# Patient Record
Sex: Female | Born: 1950
Health system: Southern US, Community
[De-identification: ages and names within clinical notes are randomized; demographics above are authoritative.]

## PROBLEM LIST (undated history)

## (undated) DIAGNOSIS — R931 Abnormal findings on diagnostic imaging of heart and coronary circulation: Secondary | ICD-10-CM

## (undated) DIAGNOSIS — J45909 Unspecified asthma, uncomplicated: Secondary | ICD-10-CM

## (undated) DIAGNOSIS — N200 Calculus of kidney: Secondary | ICD-10-CM

## (undated) DIAGNOSIS — K219 Gastro-esophageal reflux disease without esophagitis: Secondary | ICD-10-CM

## (undated) DIAGNOSIS — N649 Disorder of breast, unspecified: Secondary | ICD-10-CM

## (undated) DIAGNOSIS — R319 Hematuria, unspecified: Secondary | ICD-10-CM

## (undated) DIAGNOSIS — M5137 Other intervertebral disc degeneration, lumbosacral region: Secondary | ICD-10-CM

## (undated) DIAGNOSIS — F419 Anxiety disorder, unspecified: Secondary | ICD-10-CM

## (undated) DIAGNOSIS — R740 Nonspecific elevation of levels of transaminase and lactic acid dehydrogenase [LDH]: Secondary | ICD-10-CM

## (undated) DIAGNOSIS — I38 Endocarditis, valve unspecified: Secondary | ICD-10-CM

## (undated) DIAGNOSIS — J449 Chronic obstructive pulmonary disease, unspecified: Secondary | ICD-10-CM

## (undated) DIAGNOSIS — C7800 Secondary malignant neoplasm of unspecified lung: Secondary | ICD-10-CM

## (undated) DIAGNOSIS — F329 Major depressive disorder, single episode, unspecified: Secondary | ICD-10-CM

## (undated) DIAGNOSIS — G20 Parkinson's disease: Secondary | ICD-10-CM

## (undated) DIAGNOSIS — R9431 Abnormal electrocardiogram [ECG] [EKG]: Secondary | ICD-10-CM

## (undated) DIAGNOSIS — R0789 Other chest pain: Secondary | ICD-10-CM

## (undated) DIAGNOSIS — I10 Essential (primary) hypertension: Secondary | ICD-10-CM

## (undated) DIAGNOSIS — R7401 Elevation of levels of liver transaminase levels: Secondary | ICD-10-CM

## (undated) DIAGNOSIS — E785 Hyperlipidemia, unspecified: Secondary | ICD-10-CM

## (undated) DIAGNOSIS — F32A Depression, unspecified: Secondary | ICD-10-CM

## (undated) DIAGNOSIS — M51379 Other intervertebral disc degeneration, lumbosacral region without mention of lumbar back pain or lower extremity pain: Secondary | ICD-10-CM

## (undated) DIAGNOSIS — E7800 Pure hypercholesterolemia, unspecified: Secondary | ICD-10-CM

## (undated) DIAGNOSIS — C189 Malignant neoplasm of colon, unspecified: Secondary | ICD-10-CM

## (undated) HISTORY — DX: Major depressive disorder, single episode, unspecified: F32.9

## (undated) HISTORY — DX: Chronic obstructive pulmonary disease, unspecified: J44.9

## (undated) HISTORY — DX: Calculus of kidney: N20.0

## (undated) HISTORY — DX: Essential (primary) hypertension: I10

## (undated) HISTORY — PX: COLON SURGERY: SHX602

## (undated) HISTORY — DX: Hyperlipidemia, unspecified: E78.5

## (undated) HISTORY — DX: Depression, unspecified: F32.A

## (undated) HISTORY — DX: Malignant neoplasm of colon, unspecified: C18.9

## (undated) HISTORY — PX: TOTAL ABDOMINAL HYSTERECTOMY: SHX209

## (undated) HISTORY — DX: Parkinson's disease: G20

## (undated) HISTORY — DX: Secondary malignant neoplasm of unspecified lung: C78.00

## (undated) HISTORY — DX: Elevation of levels of liver transaminase levels: R74.01

## (undated) HISTORY — DX: Morbid (severe) obesity due to excess calories: E66.01

## (undated) HISTORY — DX: Disorder of breast, unspecified: N64.9

## (undated) HISTORY — DX: Nonspecific elevation of levels of transaminase and lactic acid dehydrogenase (ldh): R74.0

## (undated) HISTORY — DX: Abnormal electrocardiogram (ECG) (EKG): R94.31

## (undated) HISTORY — DX: Gastro-esophageal reflux disease without esophagitis: K21.9

## (undated) HISTORY — DX: Pure hypercholesterolemia, unspecified: E78.00

## (undated) HISTORY — DX: Abnormal findings on diagnostic imaging of heart and coronary circulation: R93.1

## (undated) HISTORY — DX: Other chest pain: R07.89

---

## 1978-06-11 HISTORY — PX: TUBAL LIGATION: SHX77

## 1997-06-11 DIAGNOSIS — C189 Malignant neoplasm of colon, unspecified: Secondary | ICD-10-CM

## 1997-06-11 HISTORY — DX: Malignant neoplasm of colon, unspecified: C18.9

## 1998-06-11 HISTORY — PX: CHOLECYSTECTOMY: SHX55

## 2000-05-23 ENCOUNTER — Encounter: Payer: Self-pay | Admitting: Cardiology

## 2000-05-30 ENCOUNTER — Encounter: Payer: Self-pay | Admitting: Cardiology

## 2001-06-11 HISTORY — PX: CARDIAC CATHETERIZATION: SHX172

## 2001-08-21 ENCOUNTER — Encounter: Payer: Self-pay | Admitting: Cardiology

## 2003-10-28 ENCOUNTER — Encounter: Payer: Self-pay | Admitting: Cardiology

## 2003-11-01 ENCOUNTER — Encounter: Payer: Self-pay | Admitting: Cardiology

## 2003-11-29 ENCOUNTER — Encounter: Payer: Self-pay | Admitting: Cardiology

## 2004-07-12 ENCOUNTER — Ambulatory Visit: Payer: Self-pay | Admitting: Internal Medicine

## 2004-08-02 ENCOUNTER — Ambulatory Visit: Payer: Self-pay | Admitting: Internal Medicine

## 2004-08-16 ENCOUNTER — Ambulatory Visit: Payer: Self-pay | Admitting: Internal Medicine

## 2004-11-10 ENCOUNTER — Encounter: Payer: Self-pay | Admitting: Cardiology

## 2004-11-16 ENCOUNTER — Encounter: Payer: Self-pay | Admitting: Cardiology

## 2005-06-03 ENCOUNTER — Emergency Department: Payer: Self-pay | Admitting: Emergency Medicine

## 2006-03-28 IMAGING — MR MRI HEAD WITHOUT AND WITH CONTRAST
12 of 20 series · 32 of 48 positions shown · non-contrast
Comparison: none

REASON FOR EXAM: dizziness and unsteadiness   poss TIA
COMMENTS:  [id number] or [phone]

[Series 2: T1 · sagittal · 5.0mm · 0.43mm/px · 3 of 19 slices shown (1 of 4)]
[im 1/19]
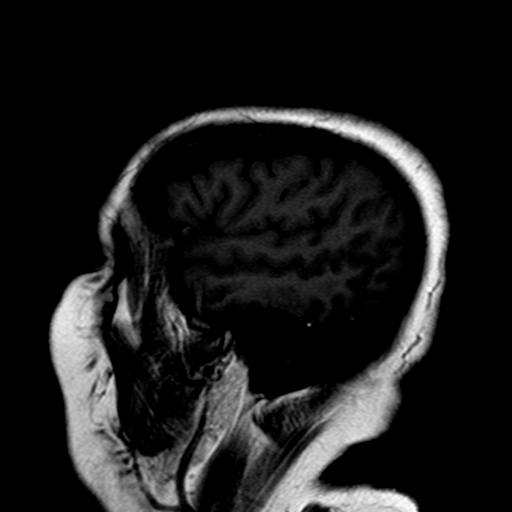
[im 10/19]
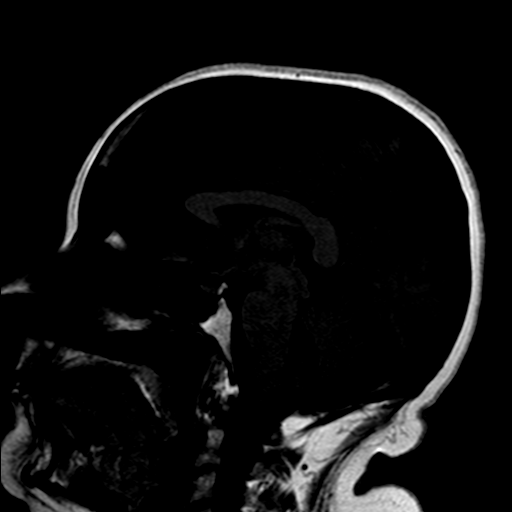
[im 19/19]
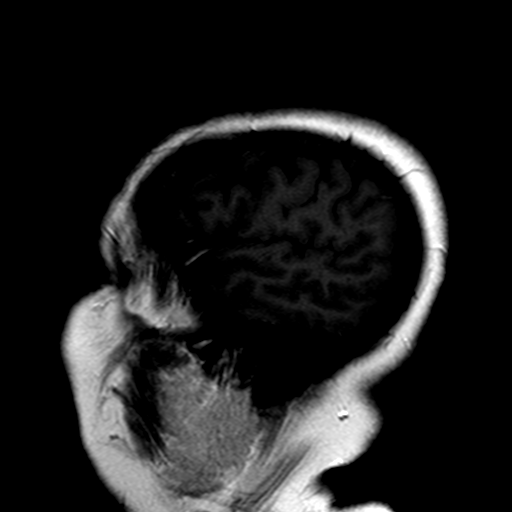

[Series 14: FLAIR · axial · 5.0mm · 0.43mm/px · z∈[-50,+83]mm · 4 of 23 slices shown (1 of 2)]
[im 1/23]
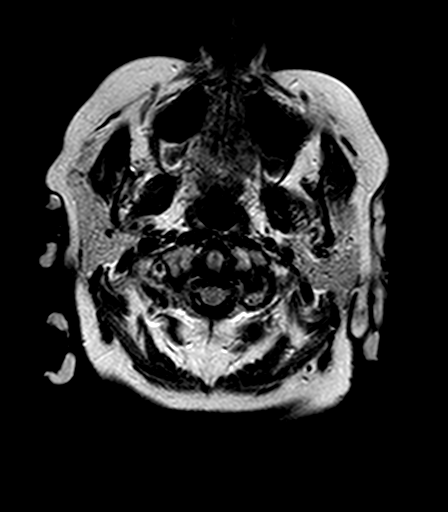
[im 8/23]
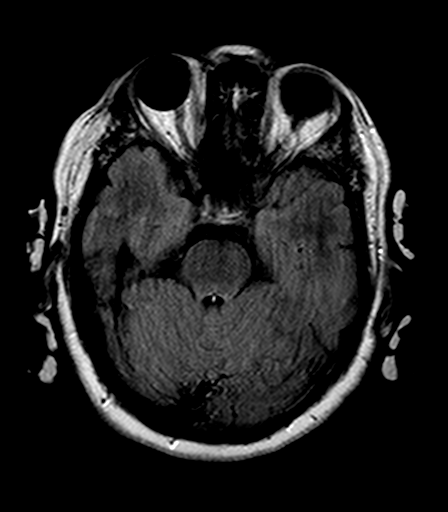
[im 15/23]
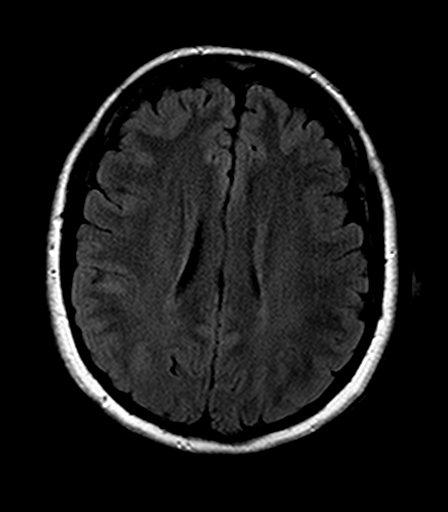
[im 23/23]
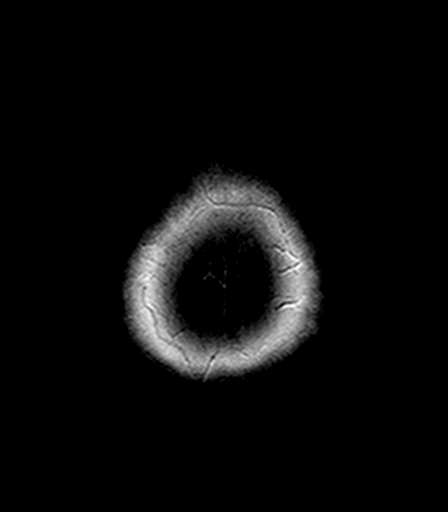

[Series 19: T1 · coronal · 5.0mm · 0.43mm/px · 4 of 23 slices shown (2 of 4)]
[im 1/23]
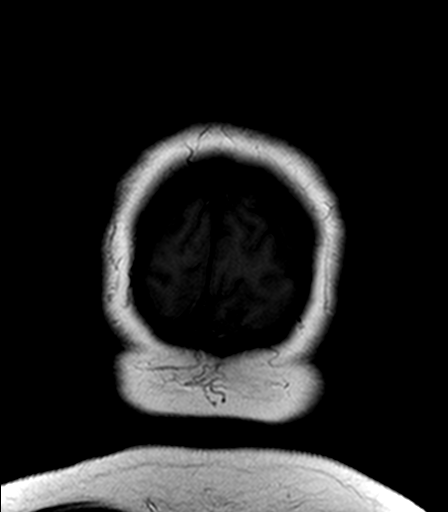
[im 8/23]
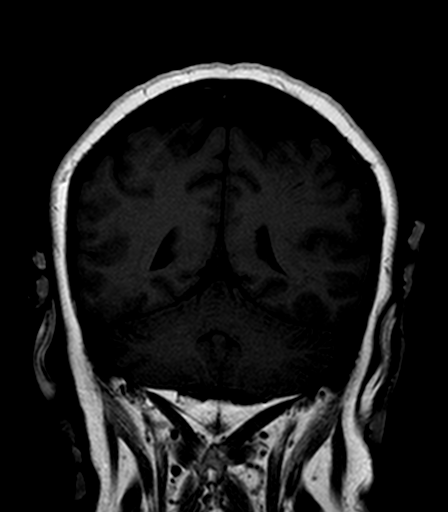
[im 15/23]
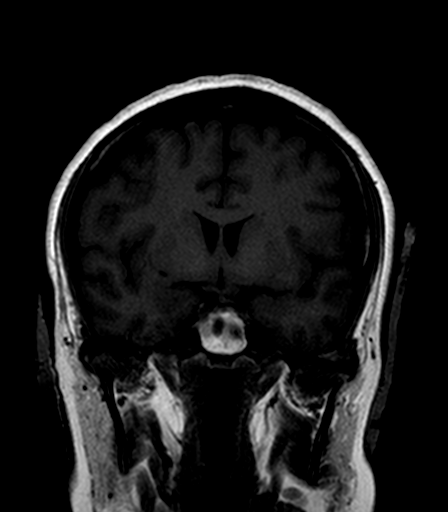
[im 23/23]
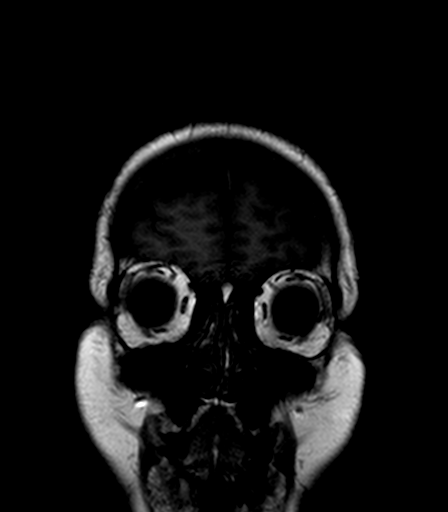

[Series 21: T1 · axial · 5.0mm · 0.86mm/px · z∈[-43,+79]mm · 4 of 23 slices shown (3 of 4)]
[im 1/23]
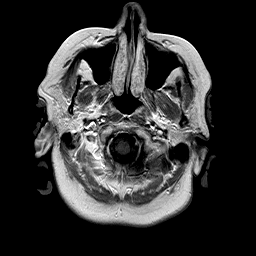
[im 8/23]
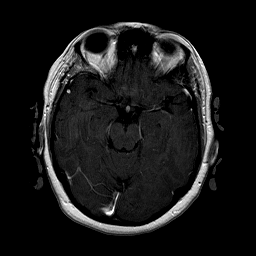
[im 15/23]
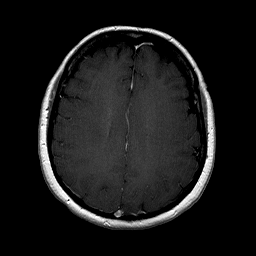
[im 23/23]
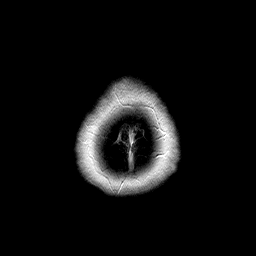

[Series 5001: T1 · axial · 10.0mm · 0.55mm/px · 1 of 3 slices shown (4 of 4)]
[im 1/3]
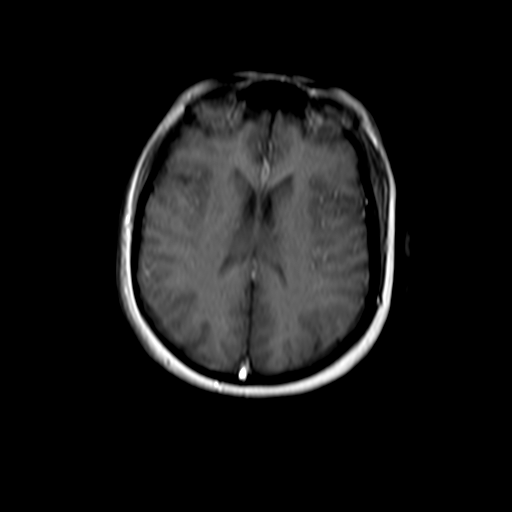

[Series 5002: DWI · axial · 5.0mm · 1.80mm/px · z∈[-34,+74]mm · 4 of 19 slices shown (1 of 2)]
[im 1/19]
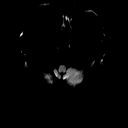
[im 7/19]
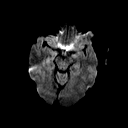
[im 13/19]
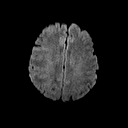
[im 19/19]
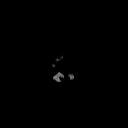

[Series 5003: ADC · axial · 5.0mm · 1.80mm/px · z∈[-34,+74]mm · 4 of 19 slices shown (1 of 2)]
[im 1/19]
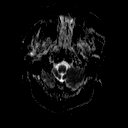
[im 7/19]
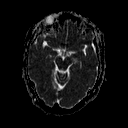
[im 13/19]
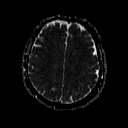
[im 19/19]
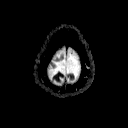

[Series 5004: DWI · axial · 10.0mm · 0.55mm/px · 1 of 3 slices shown (2 of 2)]
[im 1/3]
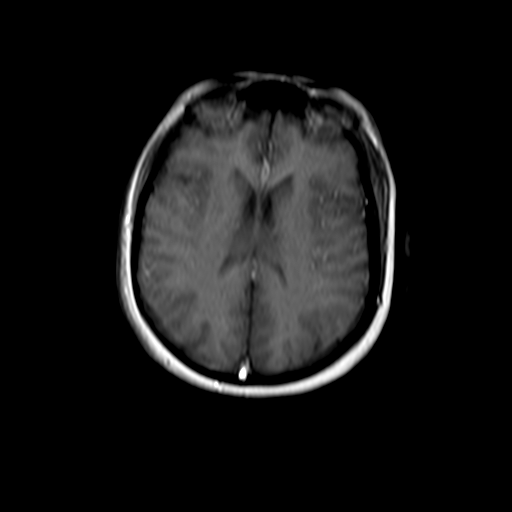

[Series 5005: ADC · axial · 10.0mm · 0.55mm/px · 1 of 3 slices shown (2 of 2)]
[im 1/3]
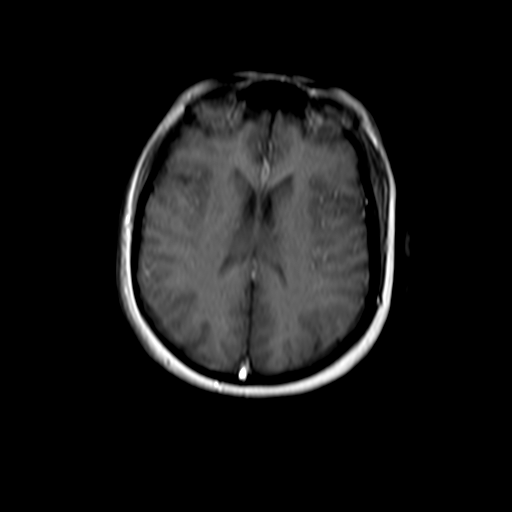

[Series 5008: T2 · axial · 5.0mm · 0.57mm/px · z∈[-42,+80]mm · 4 of 23 slices shown (1 of 2)]
[im 1/23]
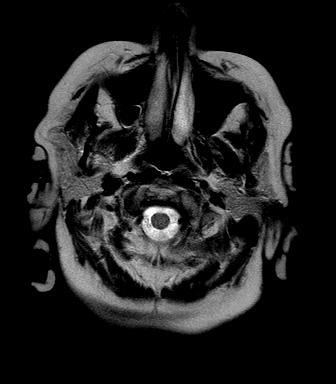
[im 8/23]
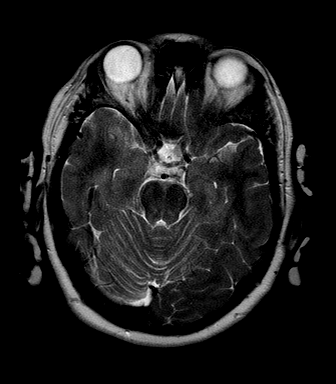
[im 15/23]
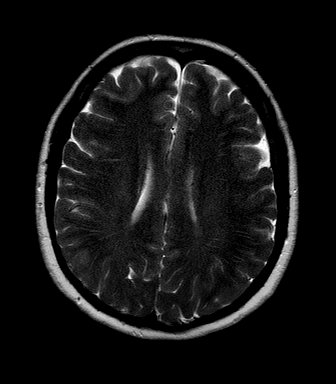
[im 23/23]
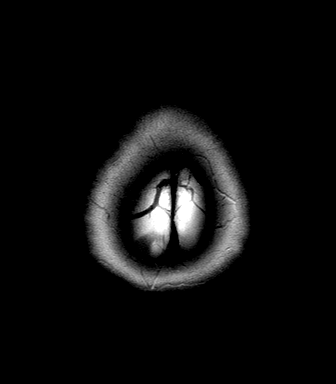

[Series 5010: T2 · axial · 10.0mm · 0.55mm/px · 1 of 3 slices shown (2 of 2)]
[im 1/3]
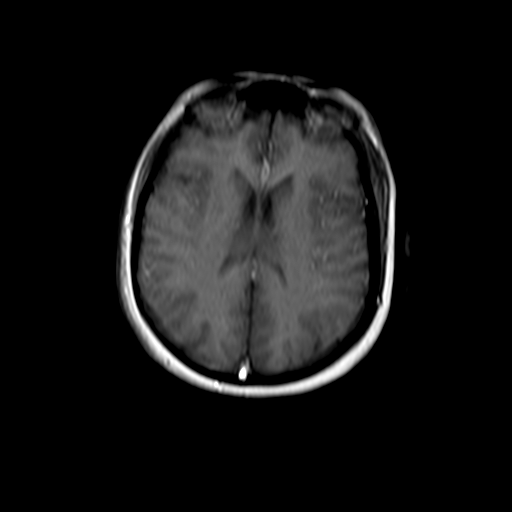

[Series 5011: FLAIR · axial · 10.0mm · 0.55mm/px · 1 of 3 slices shown (2 of 2)]
[im 1/3]
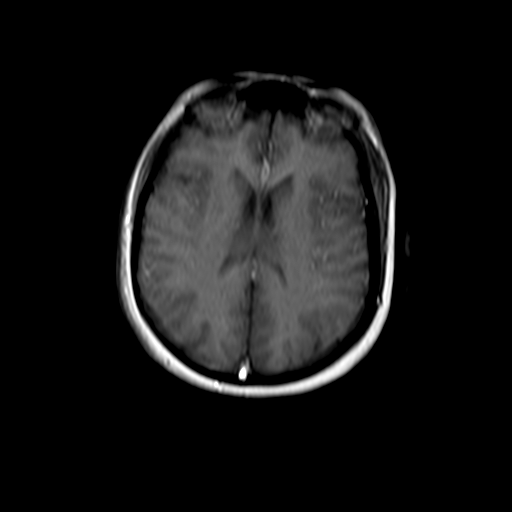

[32 of 48 positions shown; findings below may reference images not displayed]

PROCEDURE:     MR  - MR BRAIN WO/W CONTRAST  - August 02, 2004  [DATE]

RESULT:     Multiplanar/multisequence imaging of the Brain was obtained pre
and post administration of Gadolinium.

Evaluation of the diffusion-weighted images demonstrates no evidence of
increased signal intensity to suggest the sequela of an acute or subacute
infarct.  There does not appear to be evidence of intra-axial nor
extra-axial fluid collections.  The sella and parasellar regions and
structures demonstrate no signal nor enhancement abnormalities. There is
appropriate gray-white matter differentiation.  No evidence of abnormal
parenchymal enhancement or enhancing masses are demonstrated.  Evaluation of
the cerebellopontine angle regions and visualized portions of the 7th and
8th cranial nerves demonstrate no signal nor enhancement abnormalities nor
evidence of masses. Evaluation of the posterior fossa demonstrates no signal
nor enhancement abnormalities.
IMPRESSION: 1)Unremarkable Brain MRI as described above.

## 2006-05-13 ENCOUNTER — Ambulatory Visit: Payer: Self-pay | Admitting: Unknown Physician Specialty

## 2006-05-27 ENCOUNTER — Ambulatory Visit: Payer: Self-pay | Admitting: Internal Medicine

## 2006-06-05 ENCOUNTER — Ambulatory Visit: Payer: Self-pay | Admitting: Internal Medicine

## 2006-06-11 DIAGNOSIS — C7800 Secondary malignant neoplasm of unspecified lung: Secondary | ICD-10-CM

## 2006-06-11 HISTORY — DX: Secondary malignant neoplasm of unspecified lung: C78.00

## 2006-07-10 ENCOUNTER — Ambulatory Visit: Payer: Self-pay | Admitting: Urology

## 2006-11-23 ENCOUNTER — Emergency Department: Payer: Self-pay | Admitting: Emergency Medicine

## 2006-11-23 ENCOUNTER — Other Ambulatory Visit: Payer: Self-pay

## 2006-11-25 ENCOUNTER — Ambulatory Visit: Payer: Self-pay | Admitting: Oncology

## 2006-11-27 ENCOUNTER — Emergency Department: Payer: Self-pay | Admitting: Emergency Medicine

## 2006-11-29 ENCOUNTER — Ambulatory Visit: Payer: Self-pay | Admitting: Oncology

## 2006-12-10 ENCOUNTER — Ambulatory Visit: Payer: Self-pay | Admitting: Oncology

## 2007-01-08 ENCOUNTER — Ambulatory Visit: Payer: Self-pay | Admitting: Surgery

## 2007-01-10 ENCOUNTER — Ambulatory Visit: Payer: Self-pay | Admitting: Oncology

## 2007-01-10 HISTORY — PX: LOBECTOMY: SHX5089

## 2007-01-15 ENCOUNTER — Inpatient Hospital Stay: Payer: Self-pay | Admitting: Surgery

## 2007-01-20 ENCOUNTER — Other Ambulatory Visit: Payer: Self-pay

## 2007-01-27 ENCOUNTER — Ambulatory Visit: Payer: Self-pay | Admitting: Surgery

## 2007-01-27 IMAGING — CR STERNUM - 2+ VIEW
1 series · 3 of 3 positions shown · non-contrast
Comparison: none

REASON FOR EXAM: Fell with pain bruising
COMMENTS:

PROCEDURE:     DXR - DXR STERNUM  - June 03, 2005  [DATE]
RESULT:     There is no evidence of fracture, dislocation, or malalignment.

[Series 1: view not recorded · 0.17mm/px · 3 of 3 slices shown]
[im 1/3]
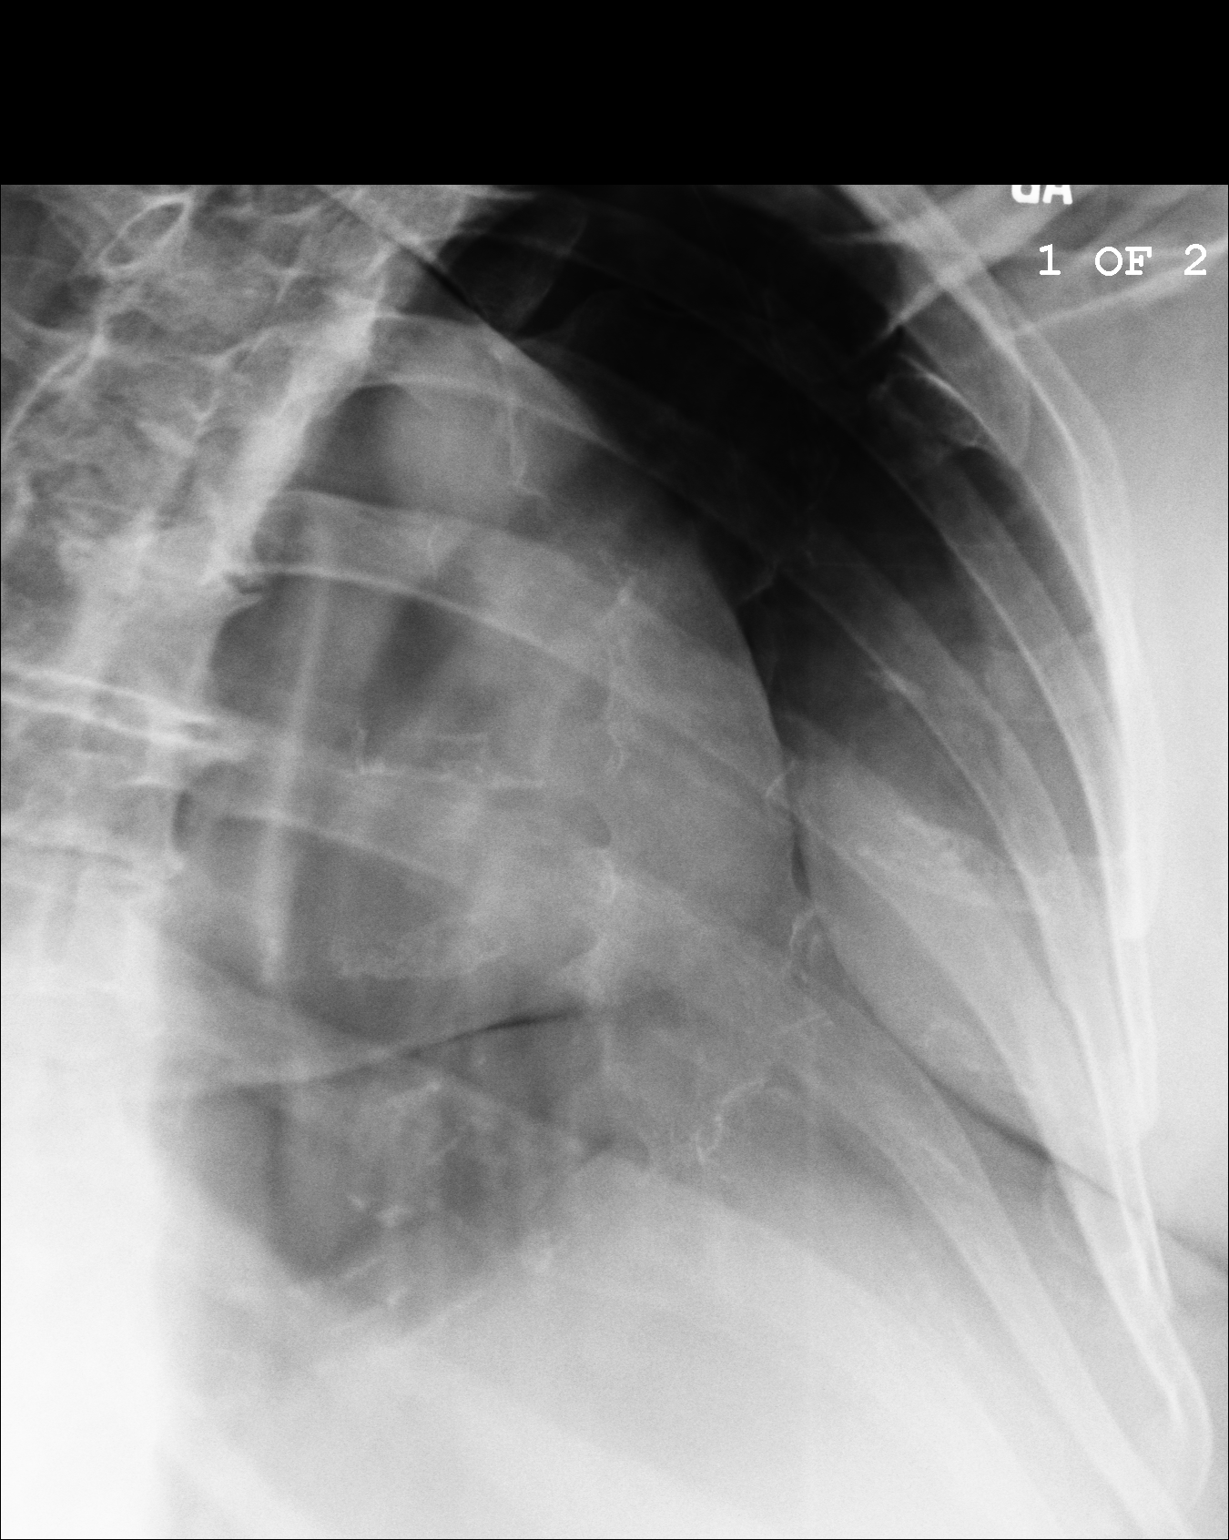
[im 2/3]
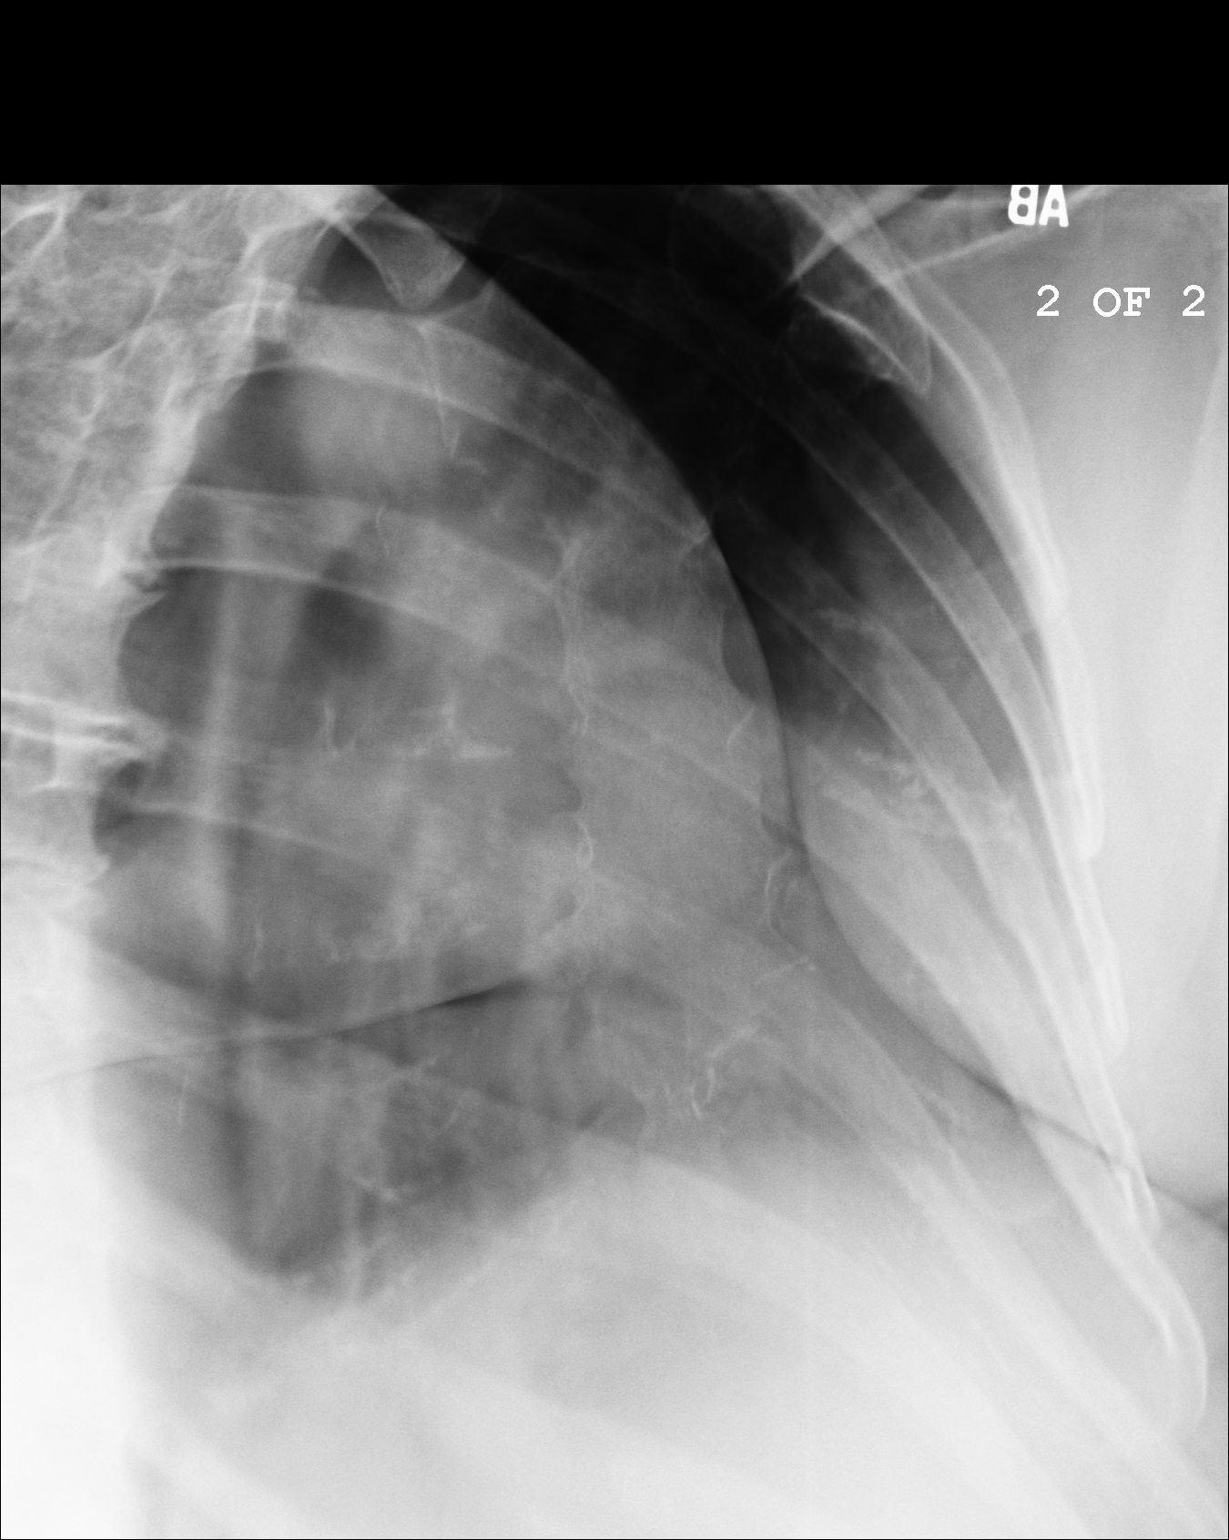
[im 3/3]
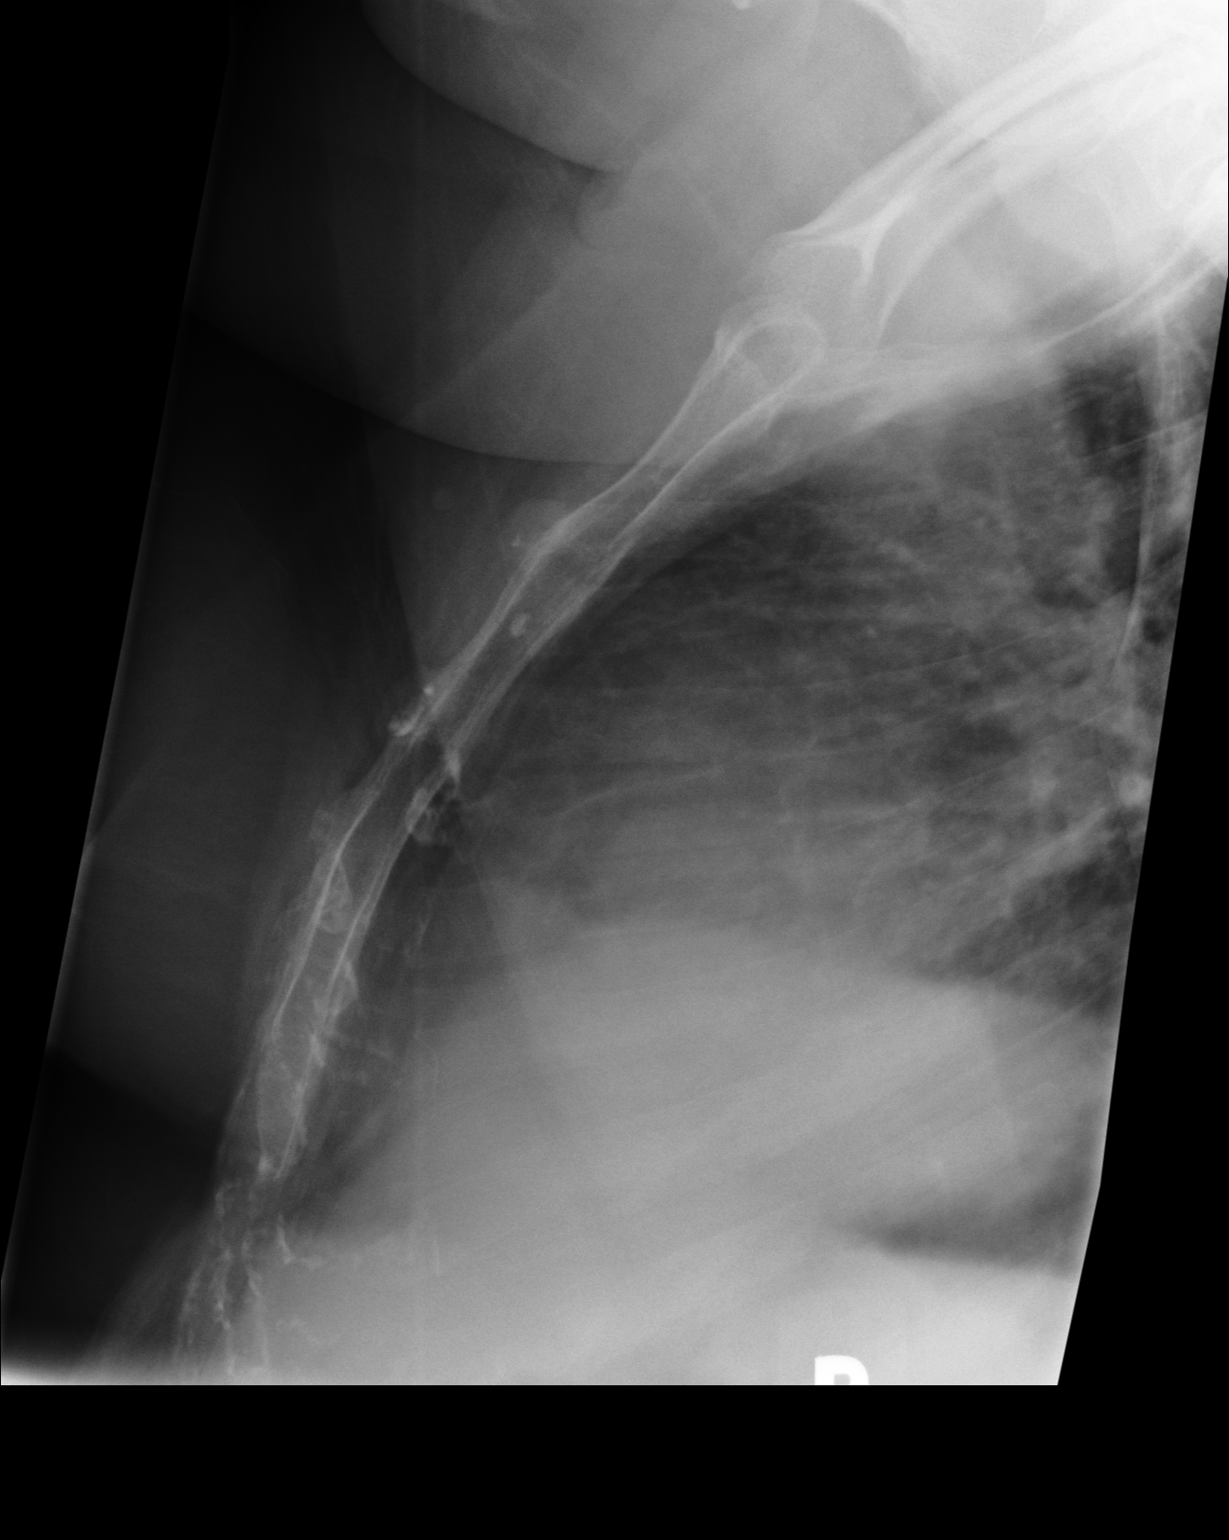

[3 of 3 positions shown; findings below may reference images not displayed]

IMPRESSION: Unremarkable sternal evaluation as described above.

## 2007-01-27 IMAGING — CR DG CHEST 2V
1 series · 2 of 2 positions shown · non-contrast
Comparison: none

REASON FOR EXAM: FALL
COMMENTS:  LMP: N/A

[Series 2903: postero_anterior · 0.11mm/px · 2 of 2 slices shown]
[im 1/2]
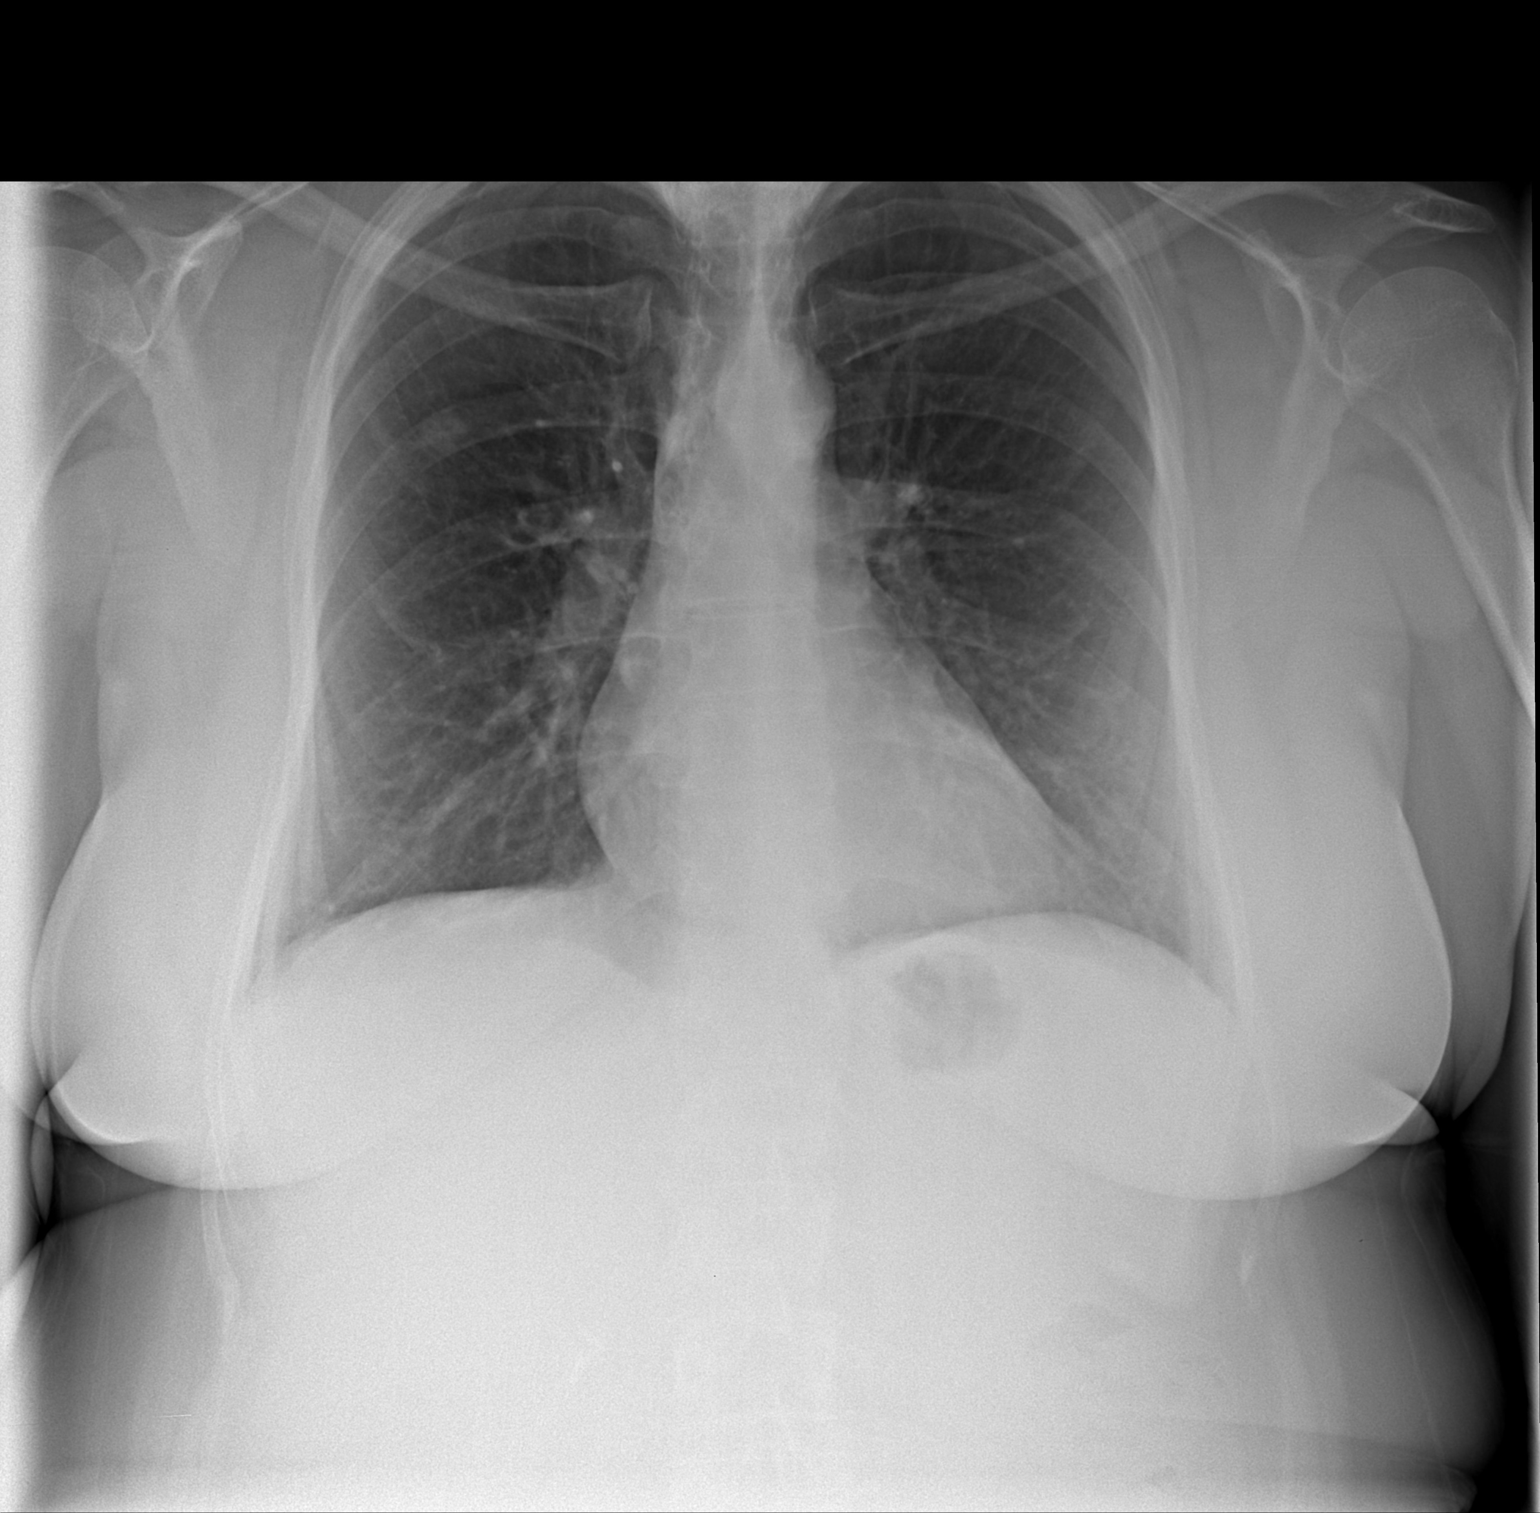
[im 2/2]
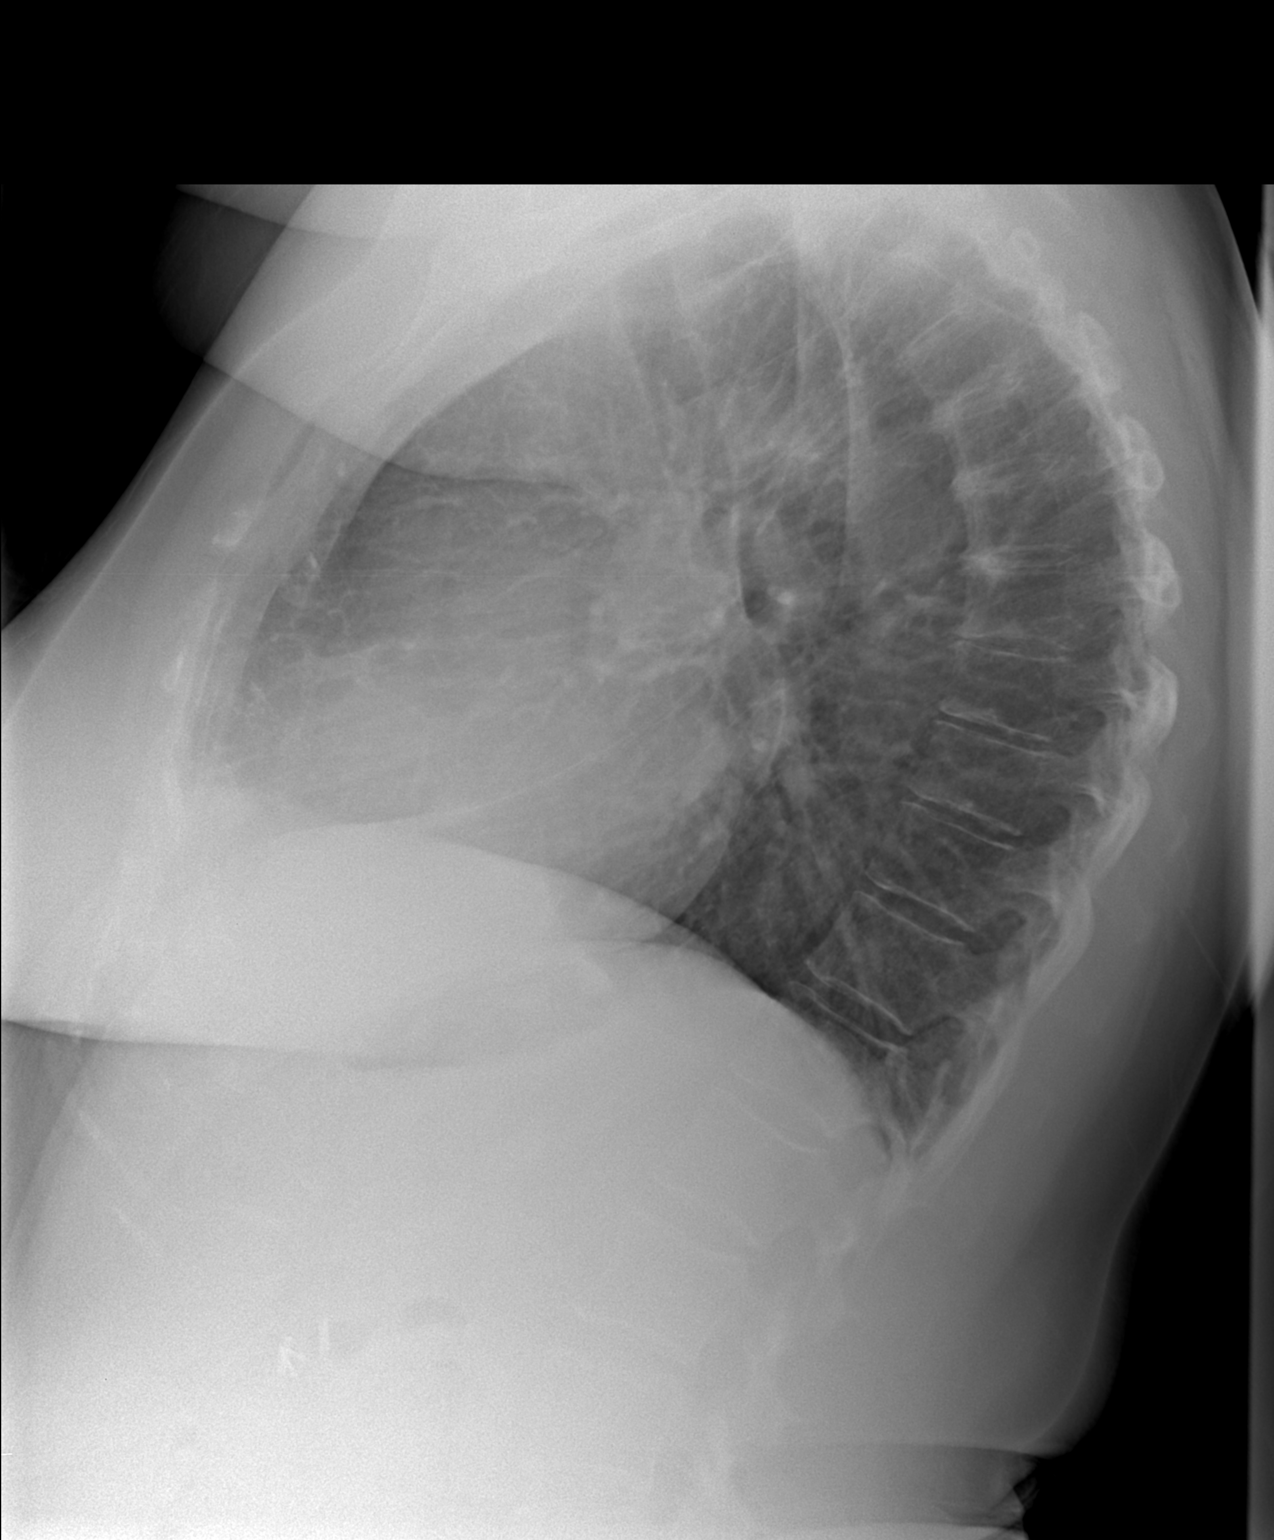

[2 of 2 positions shown; findings below may reference images not displayed]

PROCEDURE:     DXR - DXR CHEST PA (OR AP) AND LATERAL  - June 03, 2005  [DATE]

RESULT:     An ill-defined area of increased density projects within the
RIGHT upper lobe appreciated on the frontal view.  This can be monitored
with repeat two-view chest if clinically warranted or further evaluation
with chest CT.  A repeat chest radiograph can be obtained in 4-6 weeks if
clinically warranted.  Otherwise no further evidence of focal infiltrates,
effusions, or edema.  The cardiac silhouette and visualized bony skeleton
are unremarkable.
IMPRESSION: Area of increased density RIGHT upper lobe.  Repeat evaluation in 4-6 weeks
is recommended or further evaluation with chest CT.

## 2007-02-04 ENCOUNTER — Ambulatory Visit: Payer: Self-pay | Admitting: Surgery

## 2007-02-10 ENCOUNTER — Ambulatory Visit: Payer: Self-pay | Admitting: Oncology

## 2007-02-13 ENCOUNTER — Ambulatory Visit: Payer: Self-pay | Admitting: Oncology

## 2007-03-11 ENCOUNTER — Ambulatory Visit: Payer: Self-pay | Admitting: Surgery

## 2007-03-12 ENCOUNTER — Ambulatory Visit: Payer: Self-pay | Admitting: Oncology

## 2007-03-28 ENCOUNTER — Ambulatory Visit: Payer: Self-pay | Admitting: Unknown Physician Specialty

## 2007-04-01 ENCOUNTER — Ambulatory Visit: Payer: Self-pay | Admitting: Unknown Physician Specialty

## 2007-04-12 ENCOUNTER — Ambulatory Visit: Payer: Self-pay | Admitting: Oncology

## 2007-05-12 ENCOUNTER — Ambulatory Visit: Payer: Self-pay | Admitting: Oncology

## 2007-06-12 ENCOUNTER — Ambulatory Visit: Payer: Self-pay | Admitting: Oncology

## 2007-07-13 ENCOUNTER — Ambulatory Visit: Payer: Self-pay | Admitting: Oncology

## 2007-08-10 ENCOUNTER — Ambulatory Visit: Payer: Self-pay | Admitting: Oncology

## 2007-09-10 ENCOUNTER — Ambulatory Visit: Payer: Self-pay | Admitting: Oncology

## 2007-09-27 ENCOUNTER — Emergency Department: Payer: Self-pay | Admitting: Internal Medicine

## 2007-10-10 ENCOUNTER — Ambulatory Visit: Payer: Self-pay | Admitting: Oncology

## 2007-10-30 ENCOUNTER — Ambulatory Visit: Payer: Self-pay | Admitting: Oncology

## 2007-11-10 ENCOUNTER — Ambulatory Visit: Payer: Self-pay | Admitting: Oncology

## 2007-12-11 ENCOUNTER — Ambulatory Visit: Payer: Self-pay | Admitting: Unknown Physician Specialty

## 2008-01-30 ENCOUNTER — Ambulatory Visit: Payer: Self-pay | Admitting: Oncology

## 2008-02-10 ENCOUNTER — Ambulatory Visit: Payer: Self-pay | Admitting: Oncology

## 2008-03-02 ENCOUNTER — Ambulatory Visit: Payer: Self-pay | Admitting: Surgery

## 2008-03-04 IMAGING — CT CT ABDOMEN AND PELVIS WITHOUT AND WITH CONTRAST
2 of 4 series · 14 of 32 positions shown, 19 images · non-contrast
Comparison: none

REASON FOR EXAM: Hematuria
COMMENTS:

[Series 2: wo · axial · 0.80mm/px · z∈[-1400,-1048]mm · 8 of 58 slices shown, 13 images]
[im 7/58  soft-tissue]
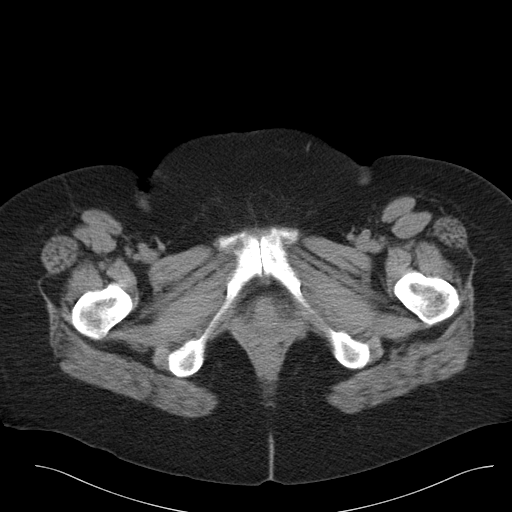
[im 7/58  bone]
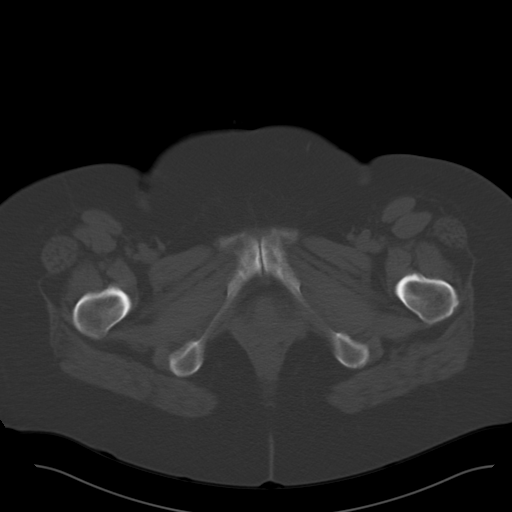
[im 13/58  soft-tissue]
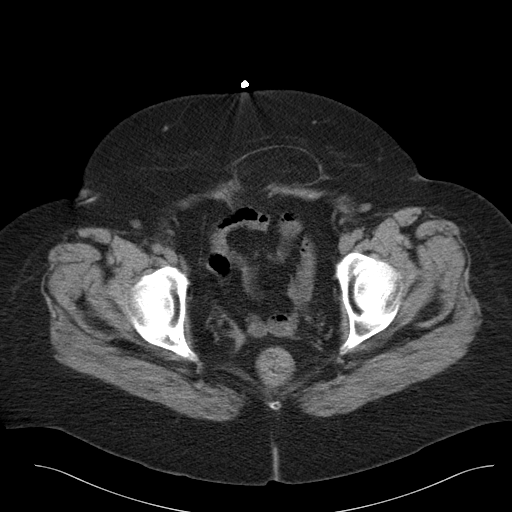
[im 20/58  soft-tissue]
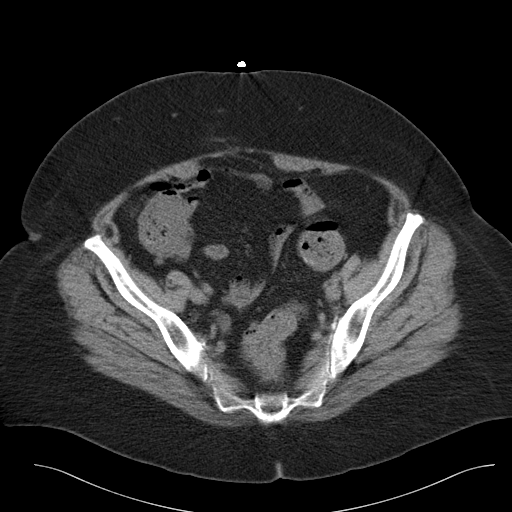
[im 26/58  soft-tissue]
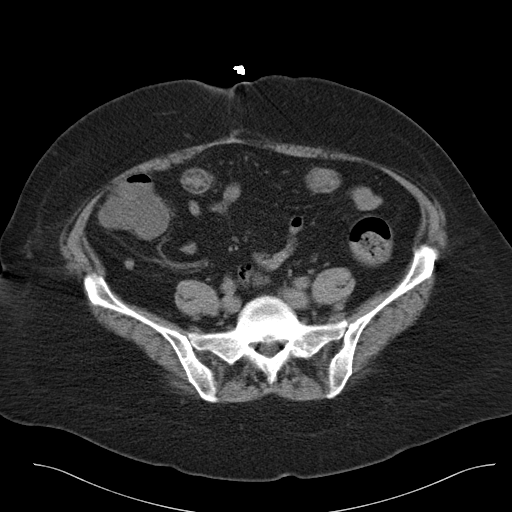
[im 32/58  soft-tissue]
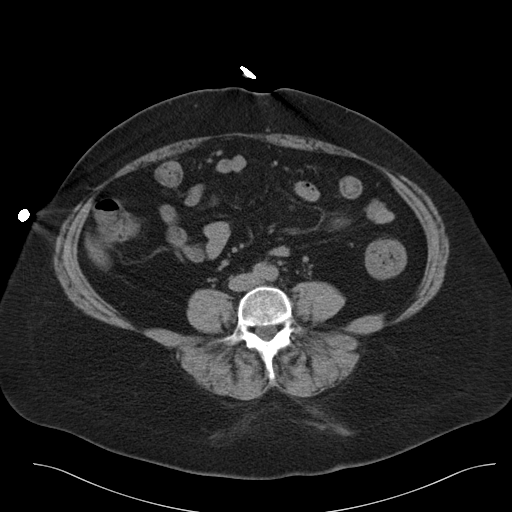
[im 32/58  lung]
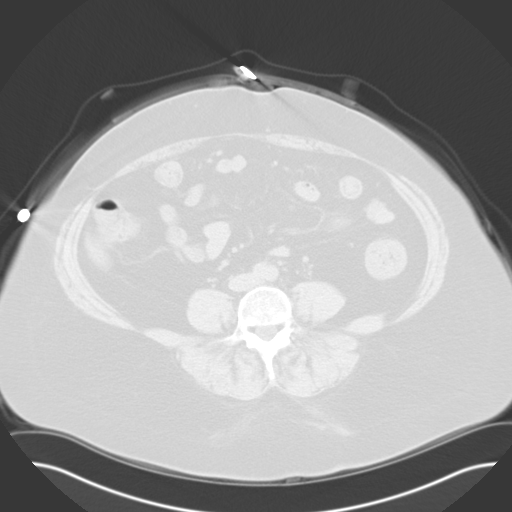
[im 39/58  soft-tissue]
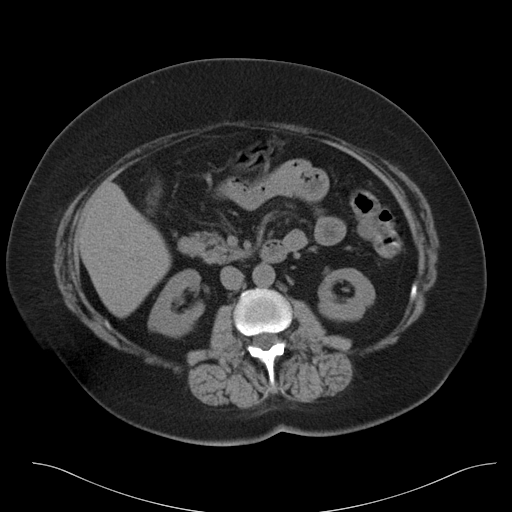
[im 39/58  lung]
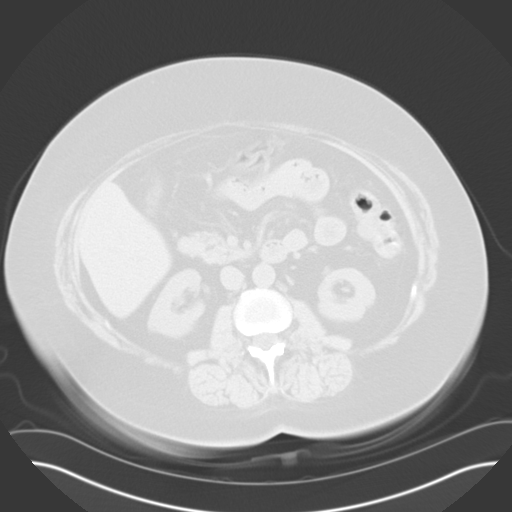
[im 45/58  soft-tissue]
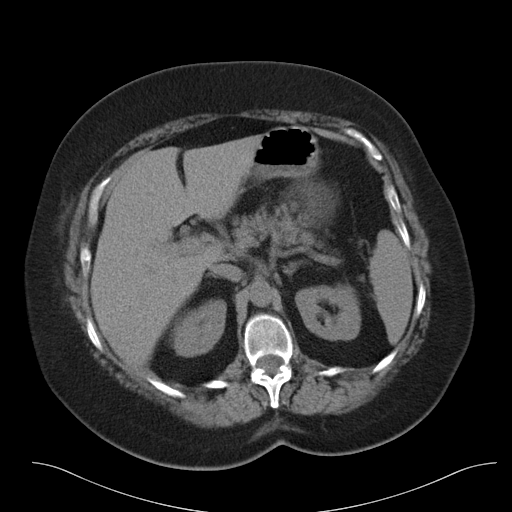
[im 45/58  lung]
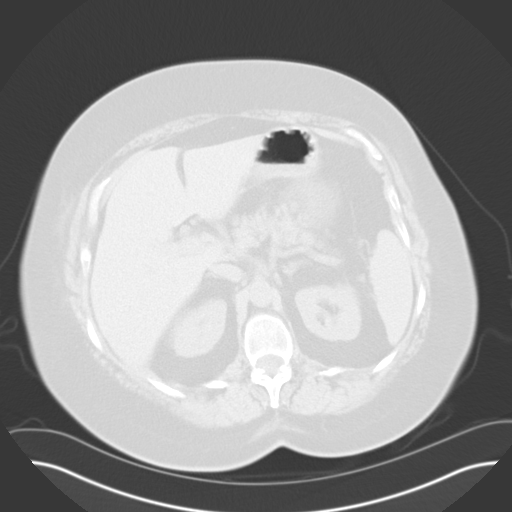
[im 51/58  soft-tissue]
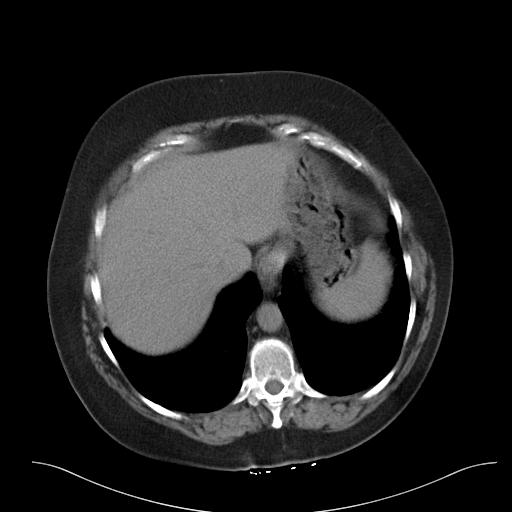
[im 51/58  lung]
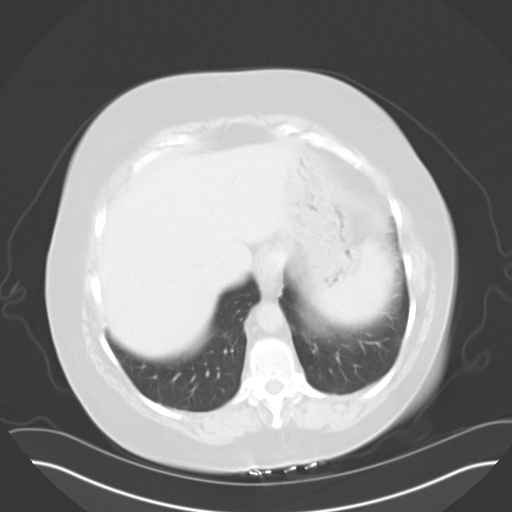

[Series 3: with · axial · 0.80mm/px · z∈[-1400,-1144]mm · 6 of 58 slices shown]
[im 7/58  soft-tissue]
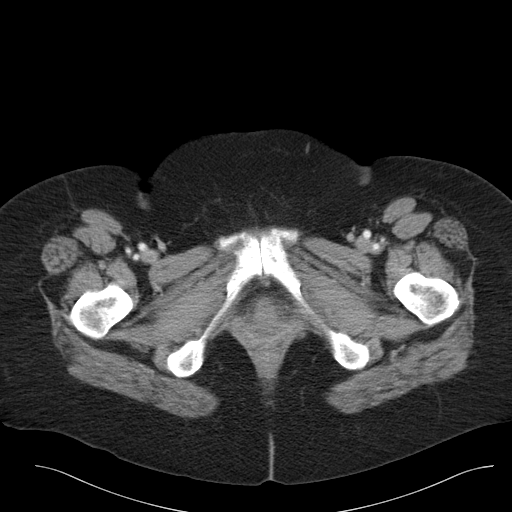
[im 13/58  soft-tissue]
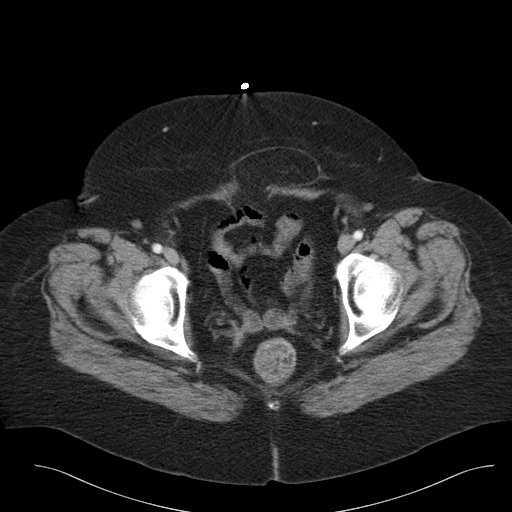
[im 20/58  soft-tissue]
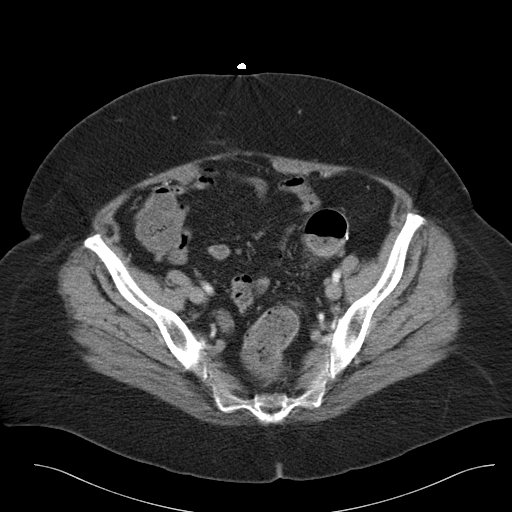
[im 26/58  soft-tissue]
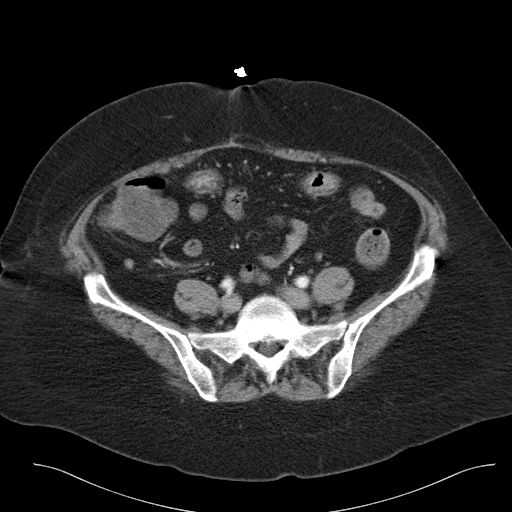
[im 32/58  soft-tissue]
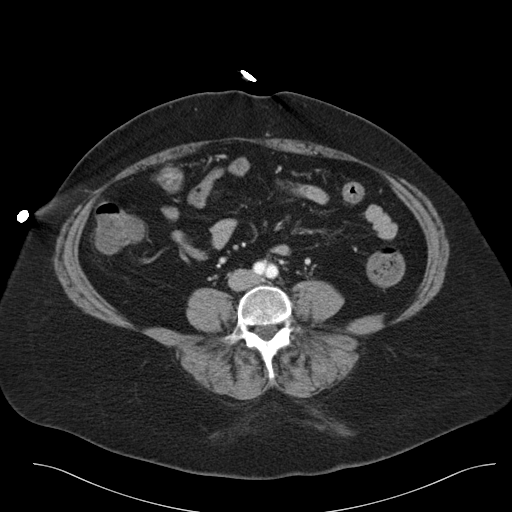
[im 39/58  soft-tissue]
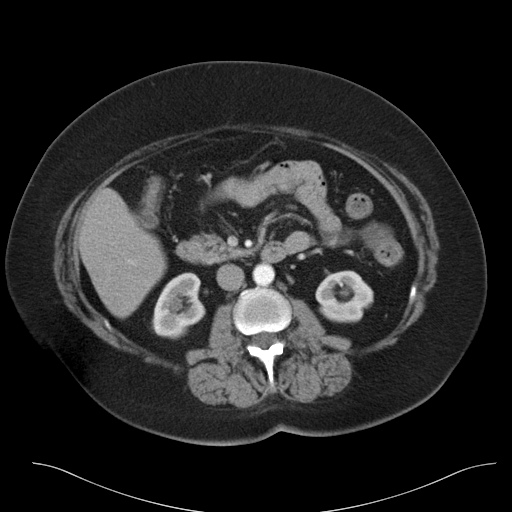

[14 of 32 positions shown; findings below may reference images not displayed]

PROCEDURE:     CT  - CT ABDOMEN / PELVIS  W/WO  - July 10, 2006  [DATE]

RESULT:     There is reported a history of intermittent hematuria for two
years.  The patient is experiencing no pain.  There is a surgical history of
hysterectomy, cholecystectomy, and partial colectomy for colon cancer.

Lung window reconstruction through the lung bases shows no pulmonary
parenchymal mass.  Precontrast images show cholecystectomy surgical clips
present.  Clips are noted in the pelvic region on the LEFT.  Urinary bladder
is nondistended.  The appendix is in a retrocecal location and appears to be
unremarkable.

Following intravenous contrast administration, the kidneys appear to enhance
normally and excrete contrast normally on the delayed images.  There is no
hydroureter or hydronephrosis.  There is no filling defect demonstrated in
the opacified urine of the bladder.  There is no abnormal bowel distention.
Again, there is no inflammation.  There is no adenopathy.  The liver,
spleen, pancreas, and adrenal glands appear to be within normal limits.  A
small hiatal hernia appears to be present.
IMPRESSION: No urinary tract calculi.

No focal renal masses.

Hysterectomy and cholecystectomy change is present.

## 2008-03-09 ENCOUNTER — Ambulatory Visit: Payer: Self-pay | Admitting: Cardiology

## 2008-03-09 LAB — CONVERTED CEMR LAB
Bilirubin, Direct: 0.1 mg/dL (ref 0.0–0.3)
Indirect Bilirubin: 0.3 mg/dL (ref 0.0–0.9)
Total Bilirubin: 0.4 mg/dL (ref 0.3–1.2)
Total Protein: 7.2 g/dL (ref 6.0–8.3)

## 2008-03-17 ENCOUNTER — Ambulatory Visit: Payer: Self-pay

## 2008-03-17 ENCOUNTER — Encounter: Payer: Self-pay | Admitting: Cardiology

## 2008-03-29 ENCOUNTER — Ambulatory Visit: Payer: Self-pay | Admitting: Oncology

## 2008-04-01 ENCOUNTER — Ambulatory Visit: Payer: Self-pay | Admitting: Cardiology

## 2008-04-11 ENCOUNTER — Ambulatory Visit: Payer: Self-pay | Admitting: Oncology

## 2008-05-03 ENCOUNTER — Ambulatory Visit: Payer: Self-pay | Admitting: Oncology

## 2008-05-11 ENCOUNTER — Ambulatory Visit: Payer: Self-pay | Admitting: Oncology

## 2008-05-30 ENCOUNTER — Emergency Department: Payer: Self-pay | Admitting: Emergency Medicine

## 2008-06-11 ENCOUNTER — Ambulatory Visit: Payer: Self-pay | Admitting: Oncology

## 2008-07-18 IMAGING — CT CT CHEST W/ CM
2 series · 16 of 32 positions shown, 20 images · IV contrast (agent unspecified)
Comparison: none

REASON FOR EXAM: EVAL RUL DENSITY, THORACIC ANEURYSM - RM 1
COMMENTS:

PROCEDURE:     CT  - CT CHEST WITH CONTRAST  - November 23, 2006  [DATE]
RESULT:
HISTORY: Rule out density.

[Series 2: soft tissue · axial · 0.79mm/px · z∈[-308,-268]mm · 2 of 55 slices shown]
[im 5/55  mediastinal]
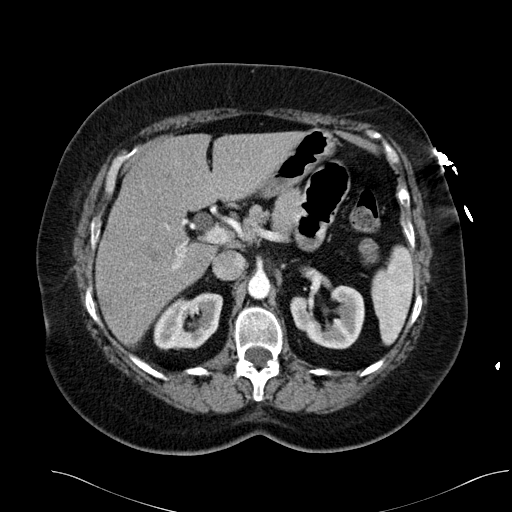
[im 13/55  mediastinal]
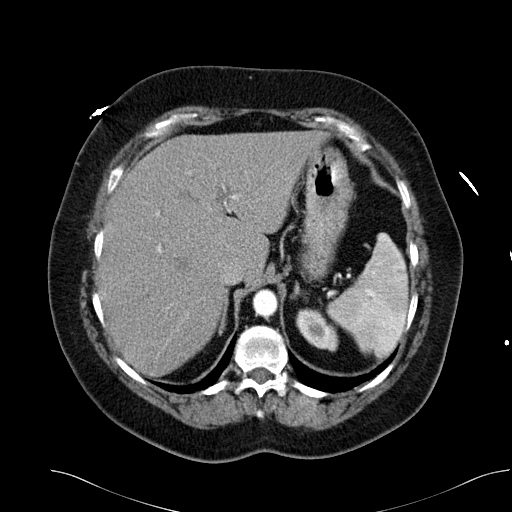

[Series 3: lung windows · axial · 0.79mm/px · z∈[-298,-78]mm · 14 of 53 slices shown, 18 images]
[im 5/53  mediastinal]
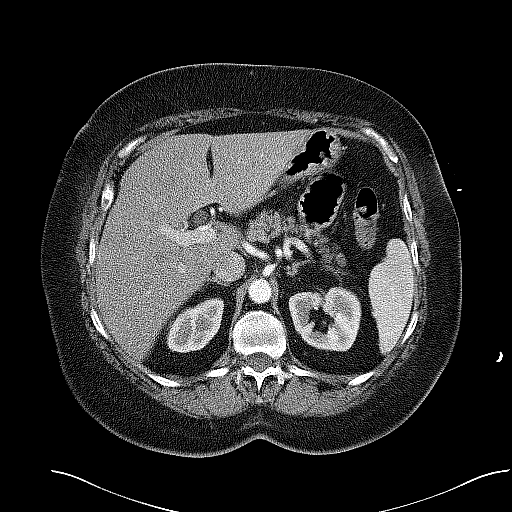
[im 5/53  lung]
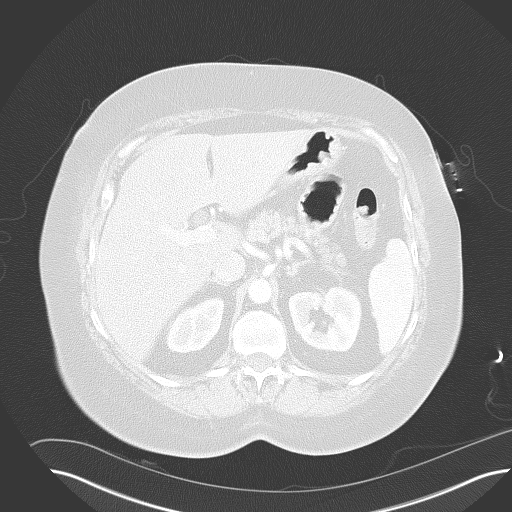
[im 9/53  lung]
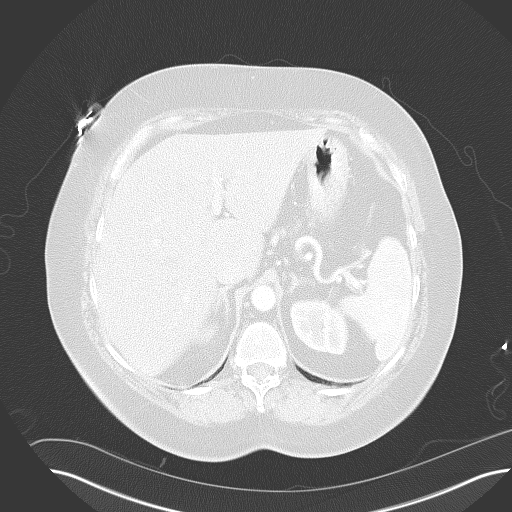
[im 13/53  lung]
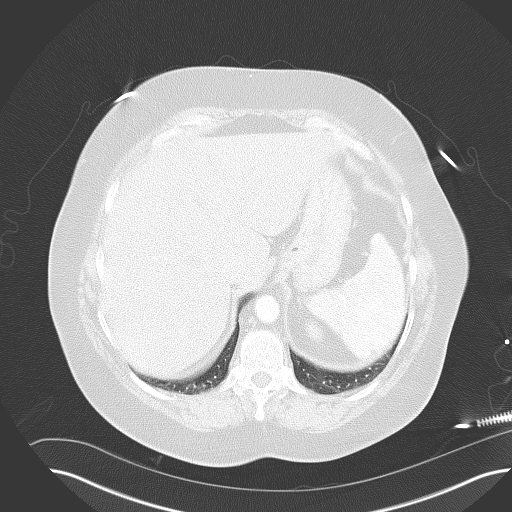
[im 17/53  lung]
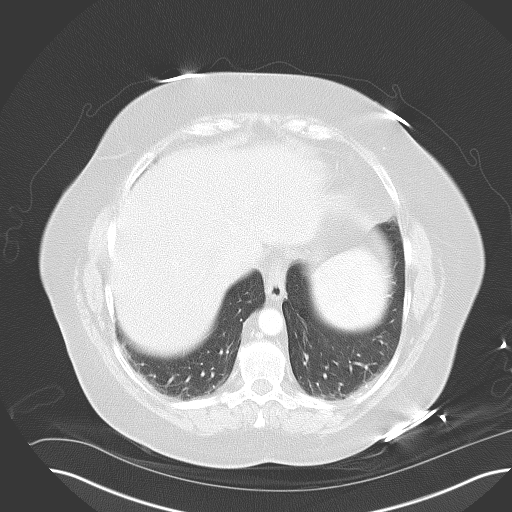
[im 21/53  mediastinal]
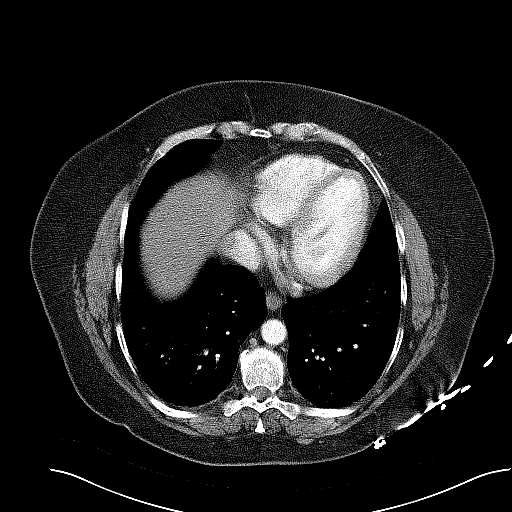
[im 21/53  lung]
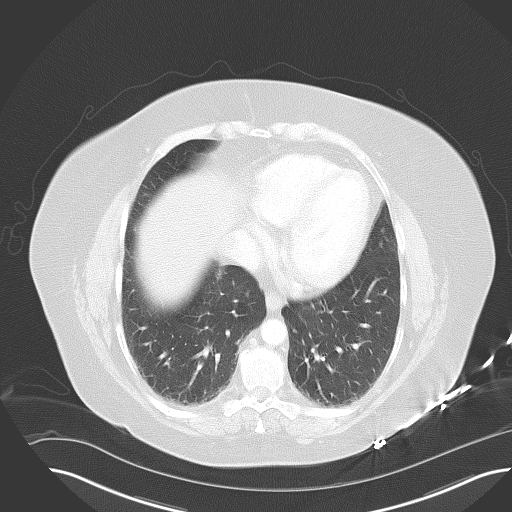
[im 24/53  lung]
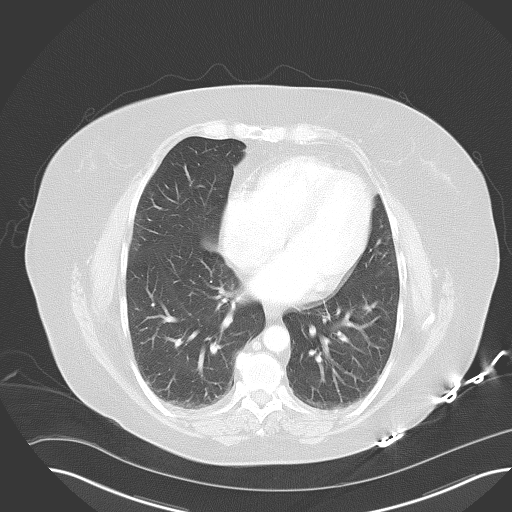
[im 25/53  lung]
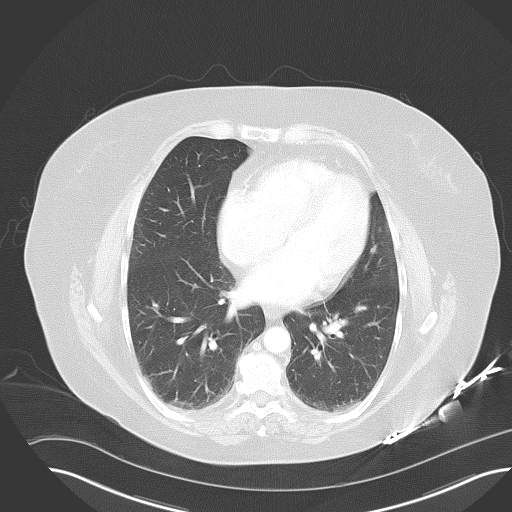
[im 27/53  lung]
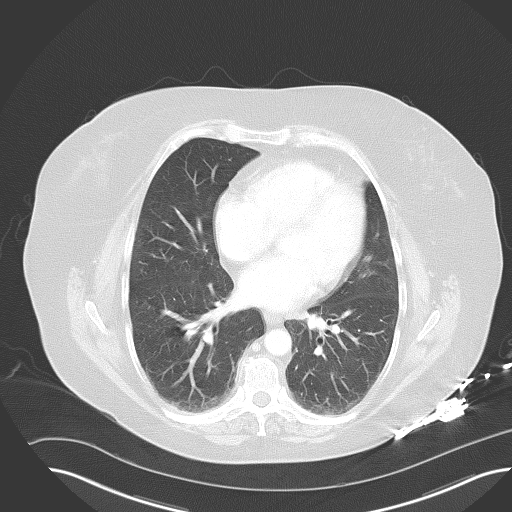
[im 29/53  mediastinal]
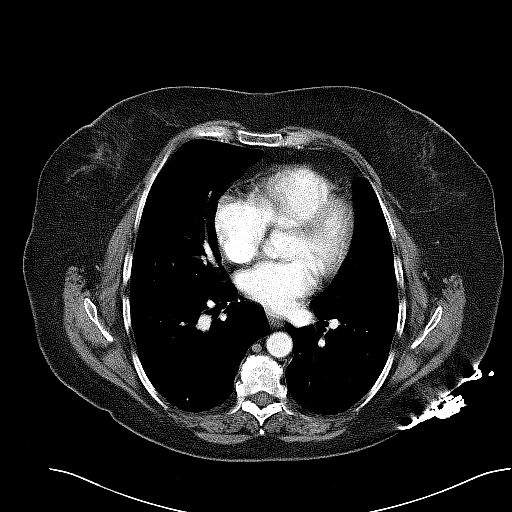
[im 29/53  lung]
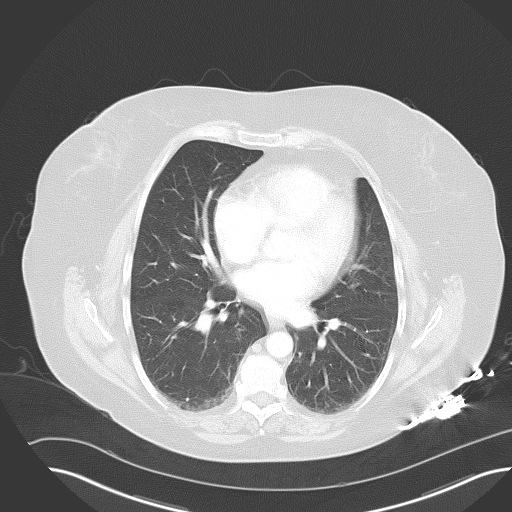
[im 33/53  lung]
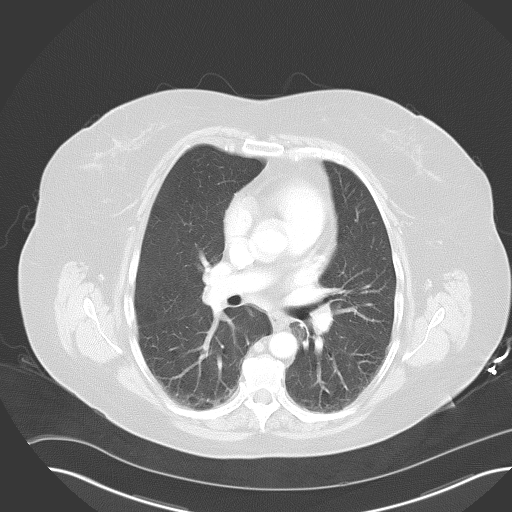
[im 37/53  lung]
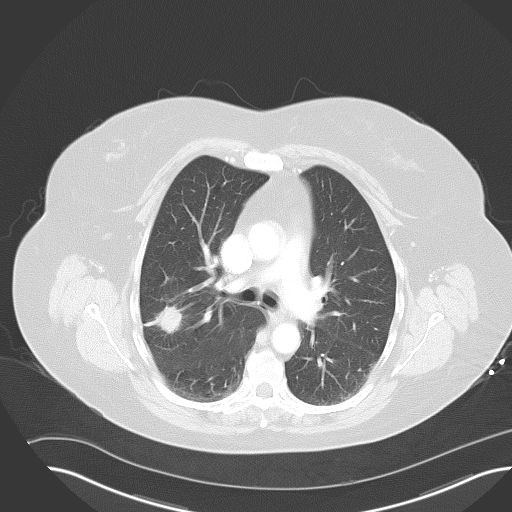
[im 41/53  lung]
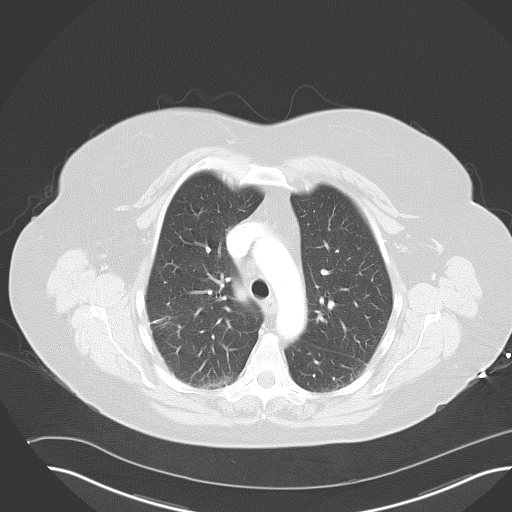
[im 45/53  mediastinal]
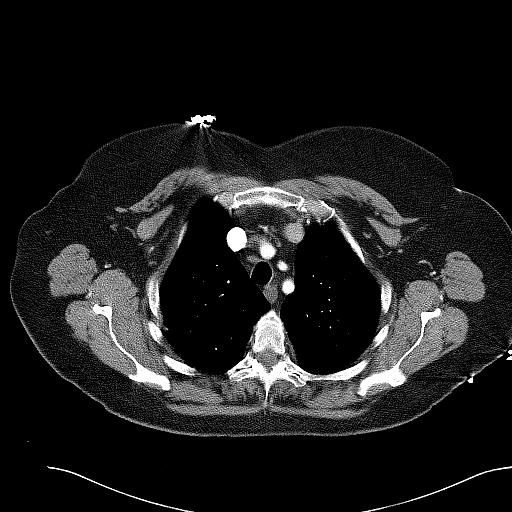
[im 45/53  lung]
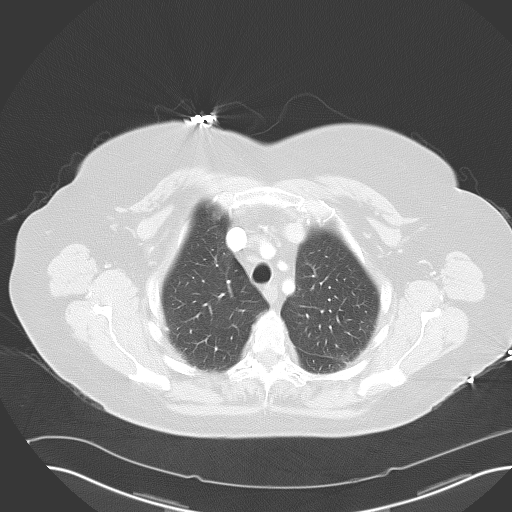
[im 49/53  lung]
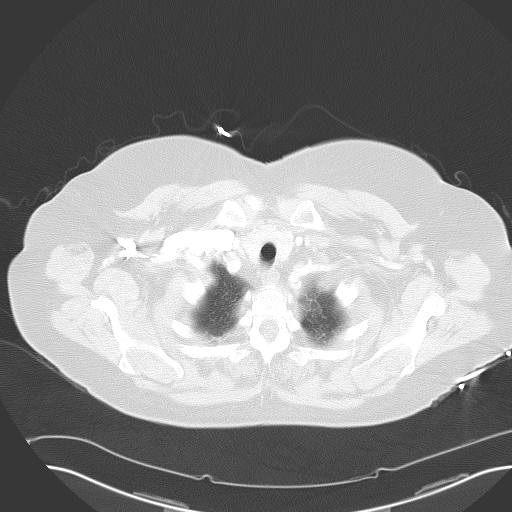

[16 of 32 positions shown; findings below may reference images not displayed]

COMPARISON STUDIES: No recent.

PROCEURE AND FINDINGS: IV contrast enhanced Chest CT is obtained. The
pulmonary arteries are normal. The thoracic aorta is normal.  The heart is
normal. The adrenals are normal. Surgical clips are noted in the gallbladder
fossa.  There is an approximately 2 cm mass in the RIGHT upper lobe with
possible adjacent smaller nodule. This is worrisome for malignancy.  PET CT
can be obtained for further evaluation if clinically indicated.
IMPRESSION: 1)Approximately 2 cm spiculated mass, RIGHT upper lobe, worrisome for
malignancy.

## 2008-07-18 IMAGING — CR DG CHEST 2V
1 series · 2 of 2 positions shown · non-contrast
Comparison: none

REASON FOR EXAM: THORACIC/CP - RM 1
COMMENTS:

[Series 1: view not recorded · 0.17mm/px · 2 of 2 slices shown]
[im 1/2]
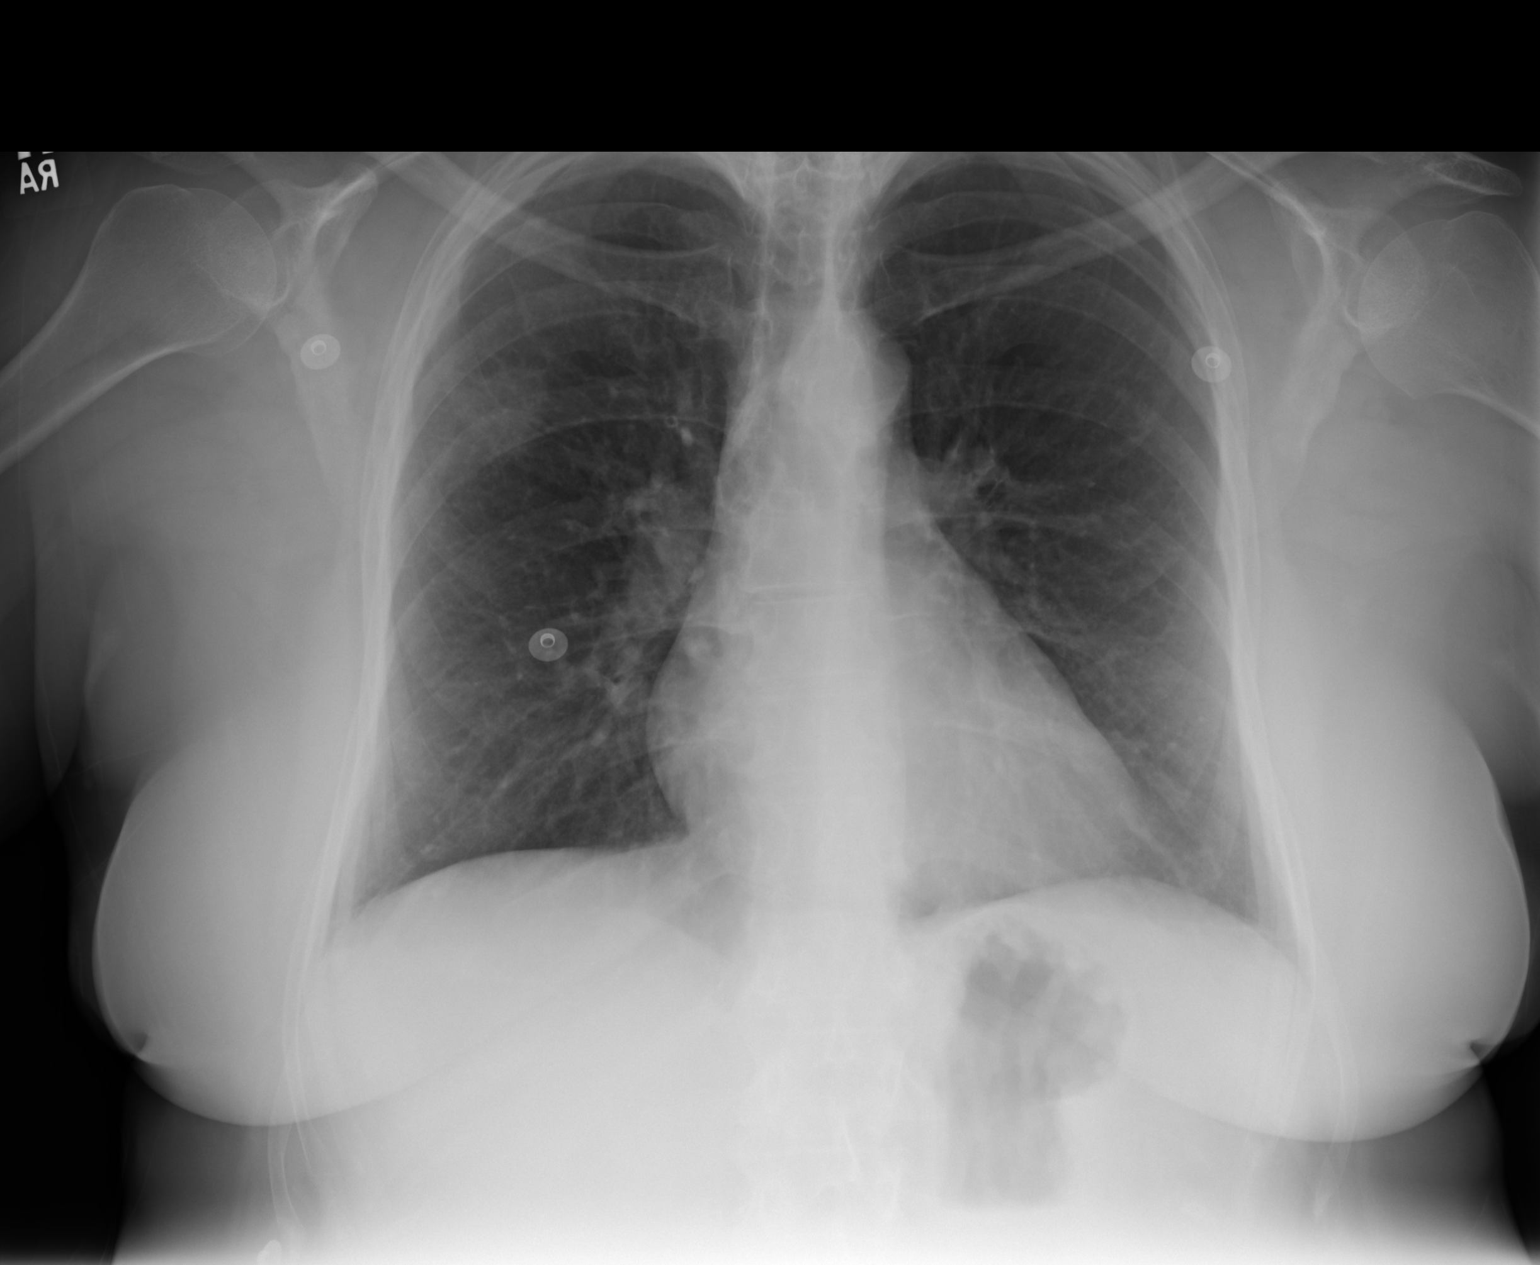
[im 2/2]
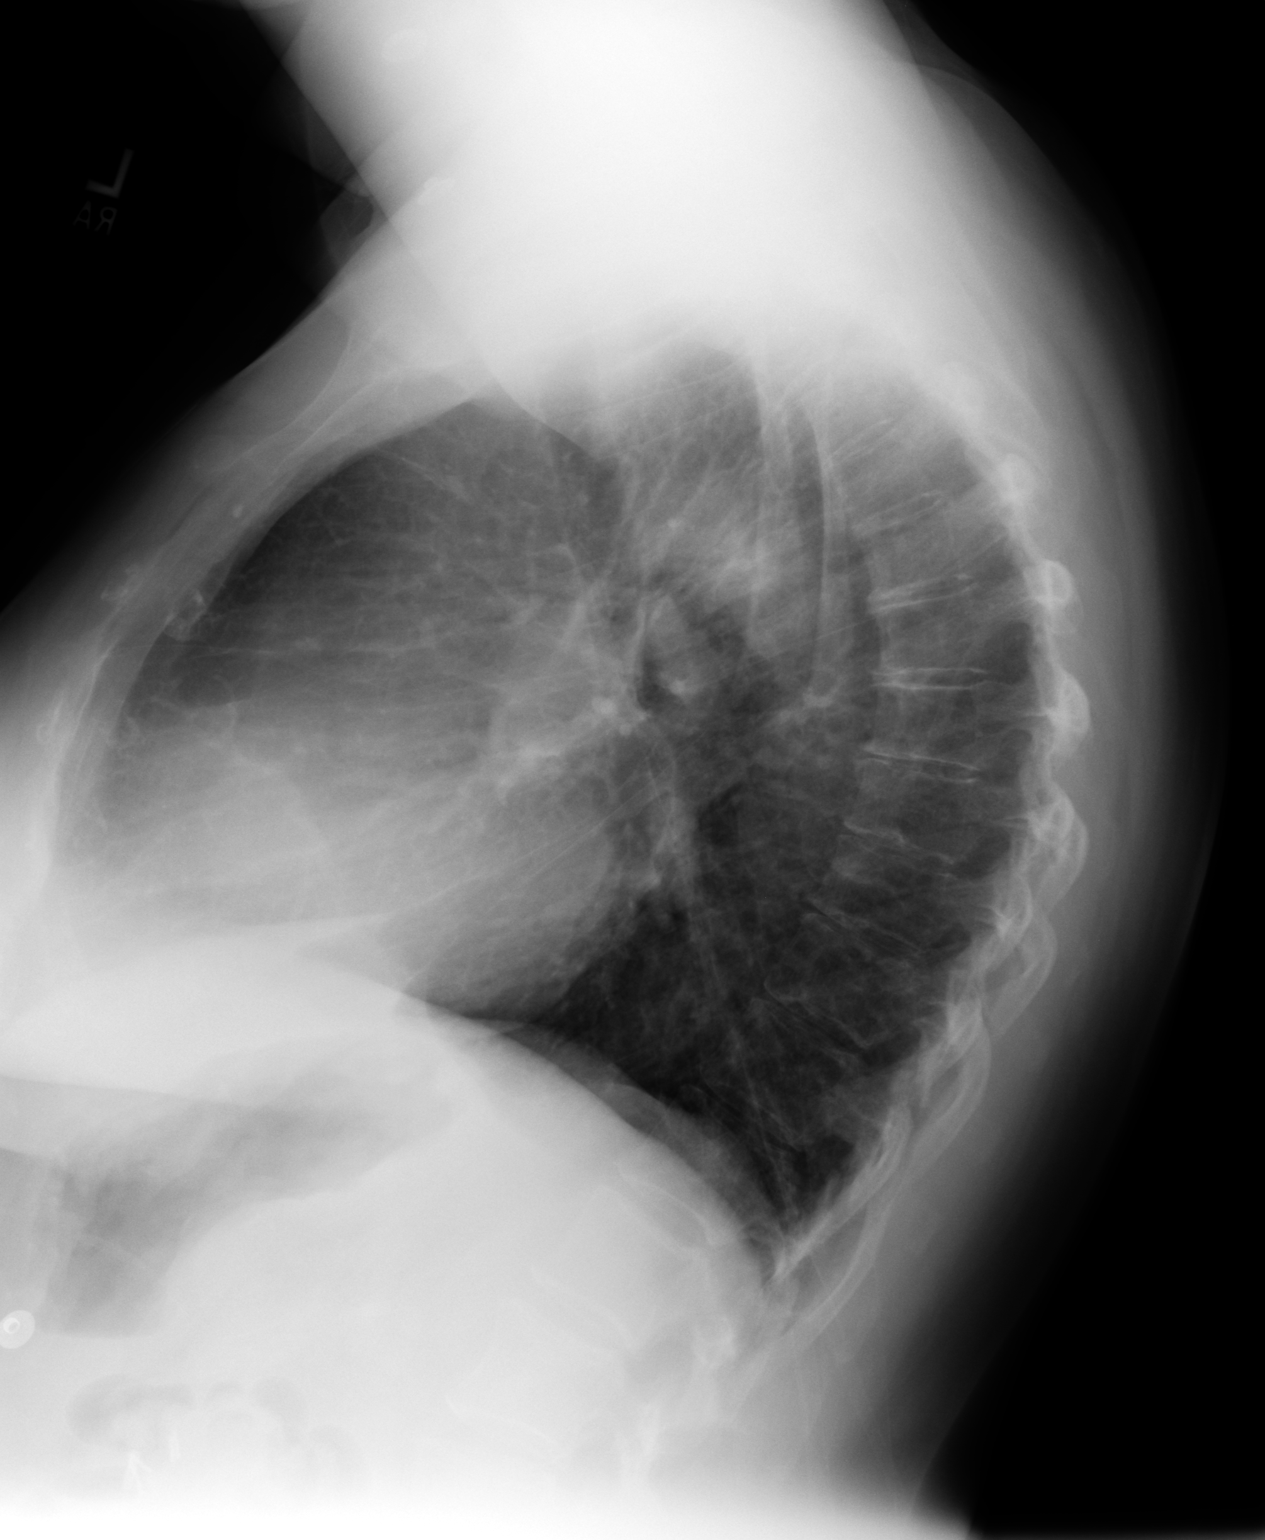

[2 of 2 positions shown; findings below may reference images not displayed]

PROCEDURE:     DXR - DXR CHEST PA (OR AP) AND LATERAL  - November 23, 2006  [DATE]

RESULT:     A stellate mass is present in the RIGHT upper lobe. This
measures approximately 2 cm.  Similar findings are noted on a recent Chest
CT of 07-25-06.  This is most consistent with a malignancy. We can perform
PET CT for further evaluation.
IMPRESSION: 1)Approximately 2 cm spiculated mass in the RIGHT upper lobe consistent with
malignancy. No other focal abnormalities are identified.

## 2008-07-24 IMAGING — PT NM PET TUM IMG LTD AREA
1 series · 25 of 25 positions shown · non-contrast
Comparison: none

REASON FOR EXAM: Hx of Colon CA  With Lung Nodule
COMMENTS:

[Series 4: pet wb · axial · 2.0mm · 4.06mm/px · z∈[-1054,-197]mm · 25 of 344 slices shown]
[im 1/344]
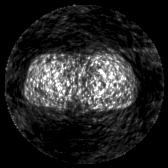
[im 15/344]
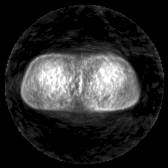
[im 29/344]
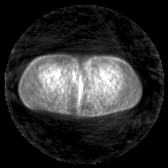
[im 43/344]
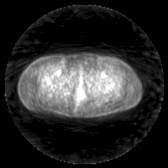
[im 58/344]
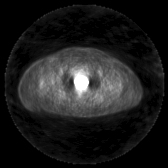
[im 72/344]
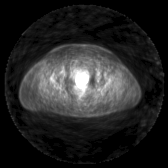
[im 86/344]
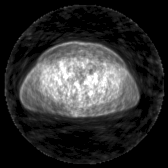
[im 101/344]
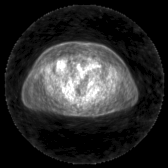
[im 115/344]
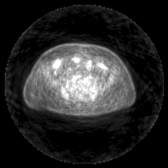
[im 129/344]
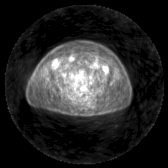
[im 143/344]
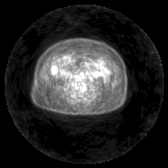
[im 158/344]
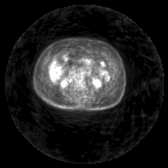
[im 172/344]
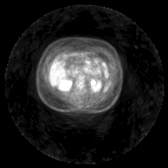
[im 186/344]
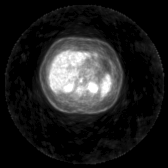
[im 201/344]
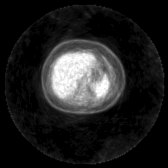
[im 215/344]
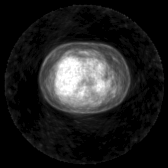
[im 229/344]
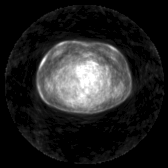
[im 243/344]
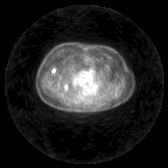
[im 258/344]
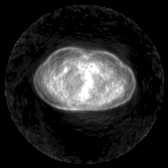
[im 272/344]
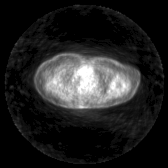
[im 286/344]
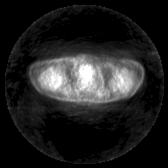
[im 301/344]
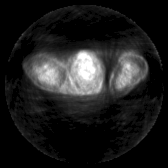
[im 315/344]
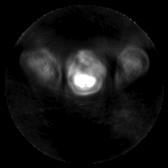
[im 329/344]
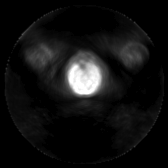
[im 344/344]
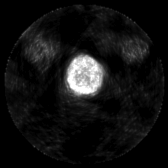

[25 of 25 positions shown; findings below may reference images not displayed]

PROCEDURE:     PET - PET/CT DX LUNG CA  - November 29, 2006  [DATE]

RESULT:     The patient's fasting blood glucose level is 99 mg/dL.  The
patient received an injection of 16.07 millicuries of fluorine-38-labeled
fluorodeoxyglucose at 9591 hours in the RIGHT hand.  Imaging is obtained
from the base of the brain into the proximal thighs between the hours of
4509 and 9692.  A noncontrast CT is performed through the same region for
the purposes of attenuation correction and fusion.  The CT, PET, and fused
PET/CT data are reconstructed in the axial, coronal, and sagittal planes and
evaluated using the interactive [facility] workstation.  There is focal increased
localization corresponding to the area of density in the RIGHT lung which
appears to be in the RIGHT upper lobe.  This has an SUV of 7.9.  There is
also increased localization in the RIGHT axillary region measuring up to
7.0.  This appears to be near a soft tissue density which may represent a
lymph node.  There are several prominent RIGHT axillary lymph nodes present.
 There is some soft tissue density in the RIGHT breast which does not show
definite abnormal localization but for which a recent mammogram would be
recommended if the patient has not undergone one.  There is no hilar or
mediastinal abnormal localization. No abnormal hepatic localization is seen.
 The GI and GU activity appears to be within normal limits.
IMPRESSION: Abnormal localization within the RIGHT lung nodule worrisome for malignancy.
This is of concern for a primary lung malignancy.

Abnormal localization in the RIGHT axillary region with multiple lymph nodes
present.  There appears to be some increased uptake in areas near there.  No
LEFT axillary adenopathy is evident.  No abnormal localization is seen in
the LEFT axilla.  There does appear to be some slightly asymmetric soft
tissue density in the RIGHT breast.  If the patient has not had a recent
mammogram then one would be recommended.  No abnormal localization is seen
within the breast parenchyma. The changes in the RIGHT axilla could be
secondary to malignancy including breast cancer, but the possibility of an
inflammatory process cannot be excluded.

## 2008-08-21 IMAGING — US ULTRASOUND RIGHT BREAST
1 series · 5 of 5 positions shown · non-contrast
Comparison: none

REASON FOR EXAM: Opacity right breast  NO CHARGE
COMMENTS:

PROCEDURE:     US  - US BREAST RIGHT  - December 27, 2006 [DATE]
RESULT:     Comparison is made to prior exams of 12/27/2006, 06/05/2006 and
08/16/2004.

[Series 1: ultrasound right breast · 5 of 5 slices shown]
[im 1/5]
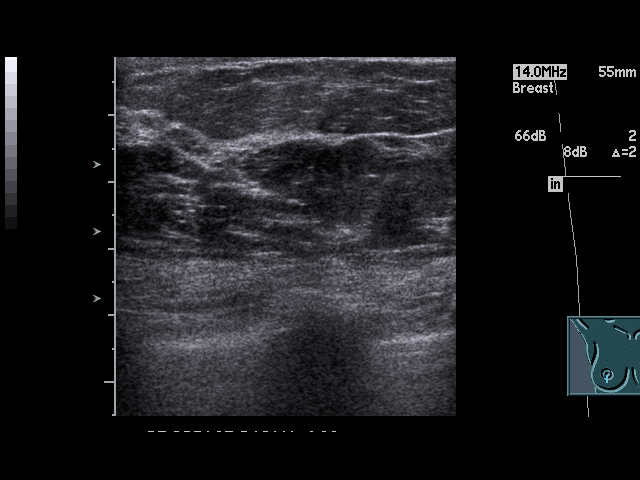
[im 2/5]
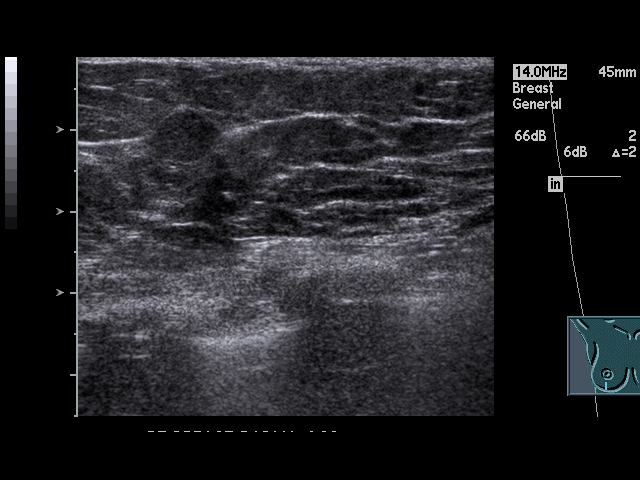
[im 3/5]
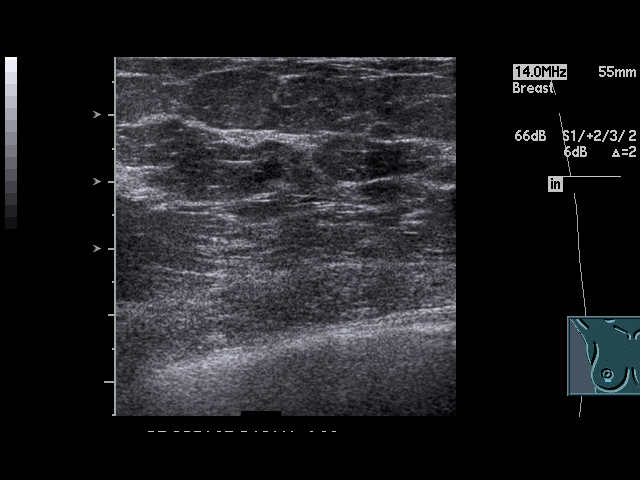
[im 4/5]
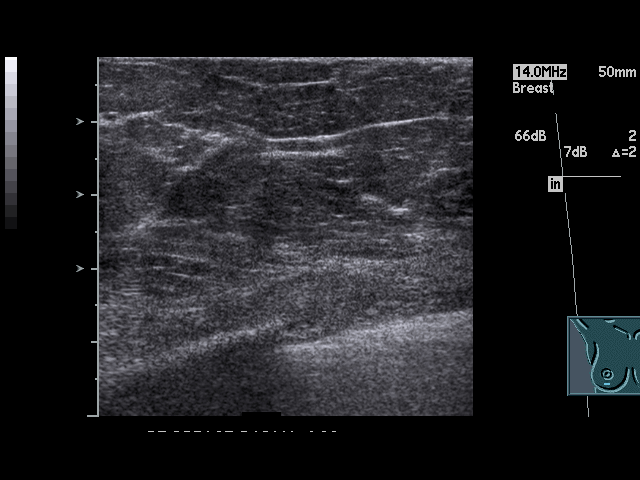
[im 5/5]
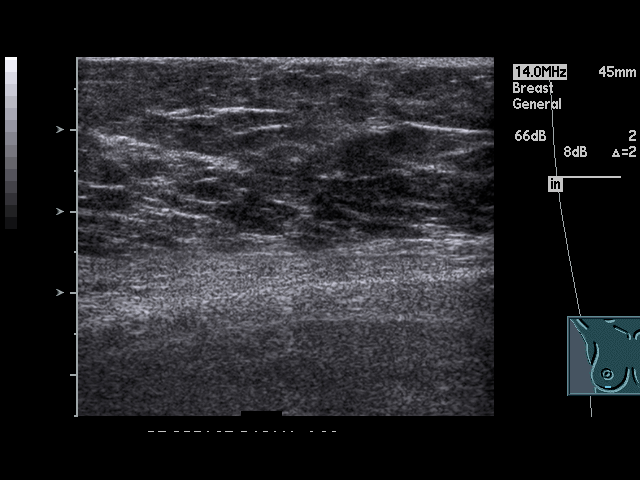

[5 of 5 positions shown; findings below may reference images not displayed]

FINDINGS: Focused ultrasound of the RIGHT breast, 6 o'clock region, was
performed.

No lesion is identified.
IMPRESSION: 1.     Focused ultrasound of the RIGHT breast in the 6 o'clock region showed
no lesion.
2.     BI-RADS: Category 1 - Negative for this ultrasound; however,
abnormality on the mammogram remains suspicious and; thus, would follow-up
recommendation of BI-RADS: Category 4 - Suspicious Abnormality for the
mammogram and for biopsy.

Thank you for this opportunity to contribute to the care of your patient.

A NEGATIVE MAMMOGRAM REPORT DOES NOT PRECLUDE BIOPSY OR OTHER EVALUATION OF
A CLINICALLY PALPABLE OR OTHERWISE SUSPICIOUS MASS OR LESION. BREAST CANCER
MAY NOT BE DETECTED BY MAMMOGRAPHY IN UP TO 10% OF CASES.

## 2008-09-09 ENCOUNTER — Ambulatory Visit: Payer: Self-pay | Admitting: Oncology

## 2008-09-09 IMAGING — CR DG CHEST 1V PORT
1 series · 1 of 1 positions shown · non-contrast
Comparison: none

REASON FOR EXAM: Thoracotomy
COMMENTS:

[view not recorded]
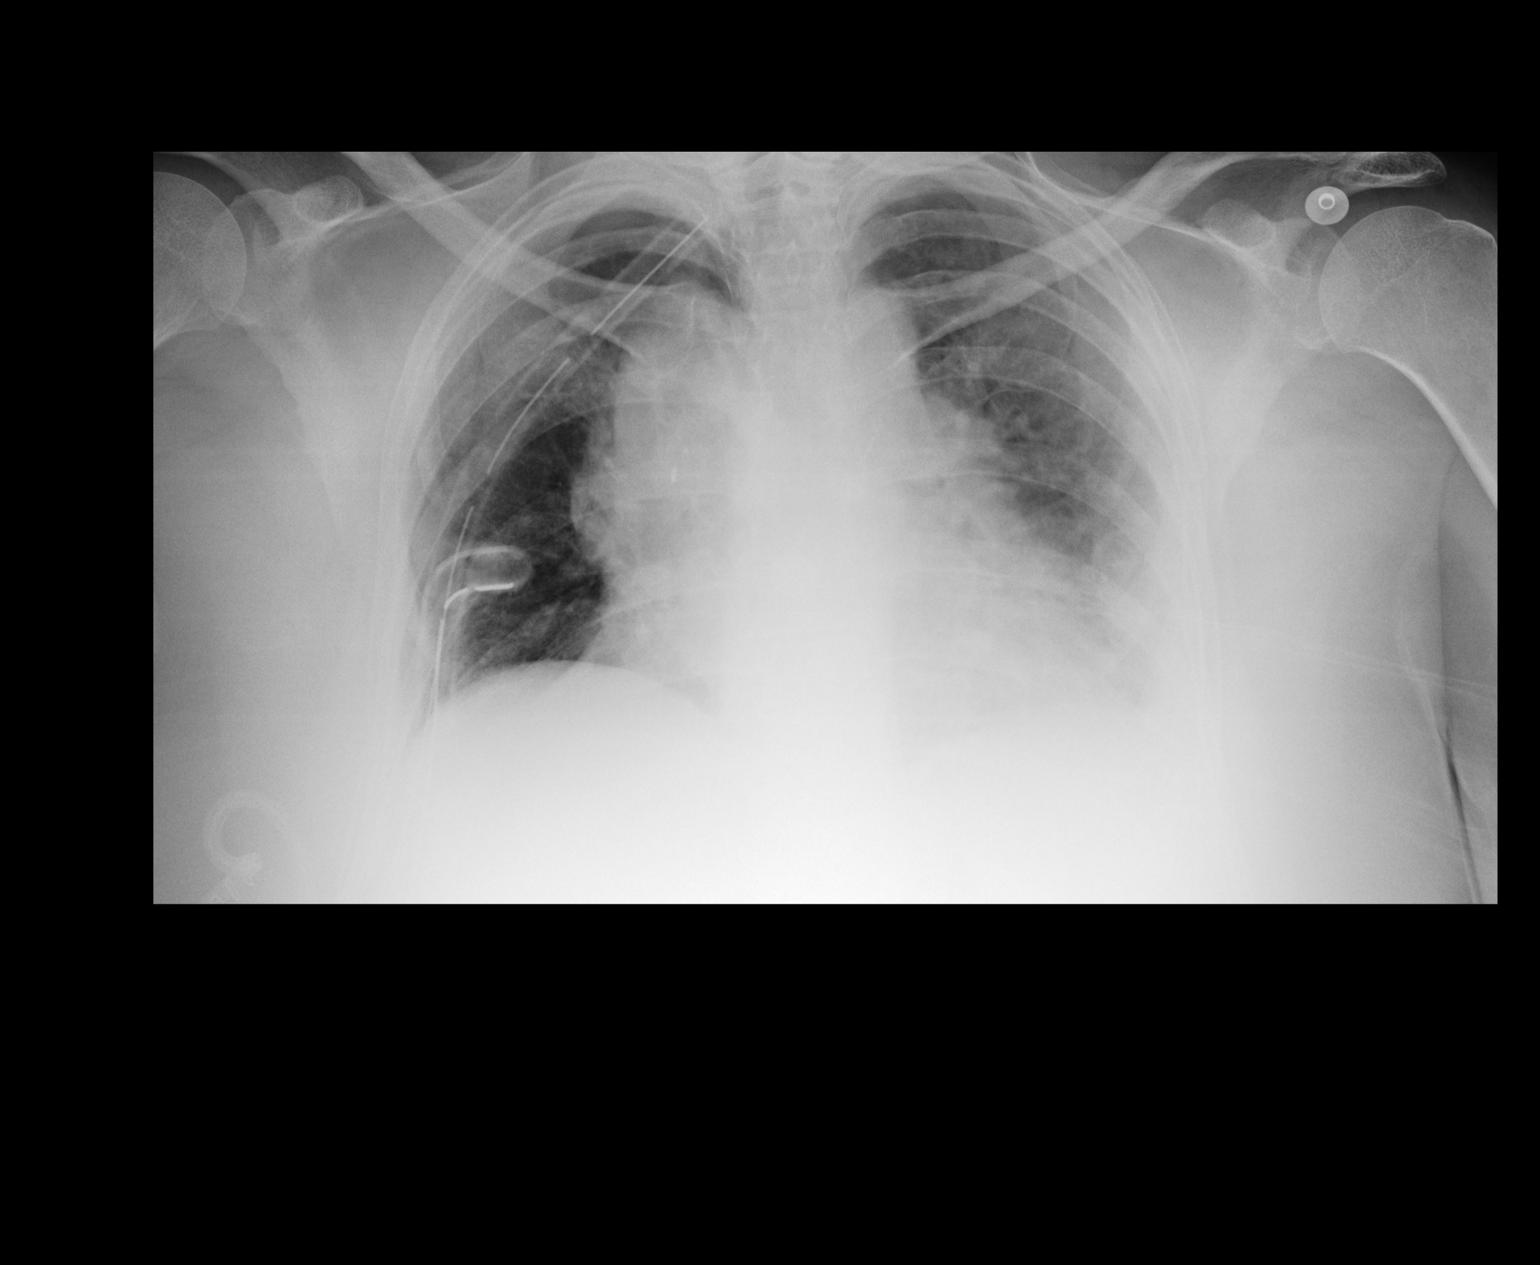

[1 of 1 positions shown; findings below may reference images not displayed]

PROCEDURE:     DXR - DXR PORTABLE CHEST SINGLE VIEW  - January 15, 2007  [DATE]

RESULT:     Comparison is made to a prior exam of 11-23-2006. Two RIGHT chest
tubes are now present. There is observed increased density in the RIGHT
hilar region, consistent with postoperative change. No pneumothorax or
pleural effusion is currently seen.
IMPRESSION: 1.     Postoperative changes are noted on the RIGHT.
2.     Two RIGHT chest tubes are now present.

## 2008-09-10 IMAGING — CR DG CHEST 1V PORT
1 series · 1 of 1 positions shown · non-contrast
Comparison: none

REASON FOR EXAM: Thoracotomy
COMMENTS:

[view not recorded]
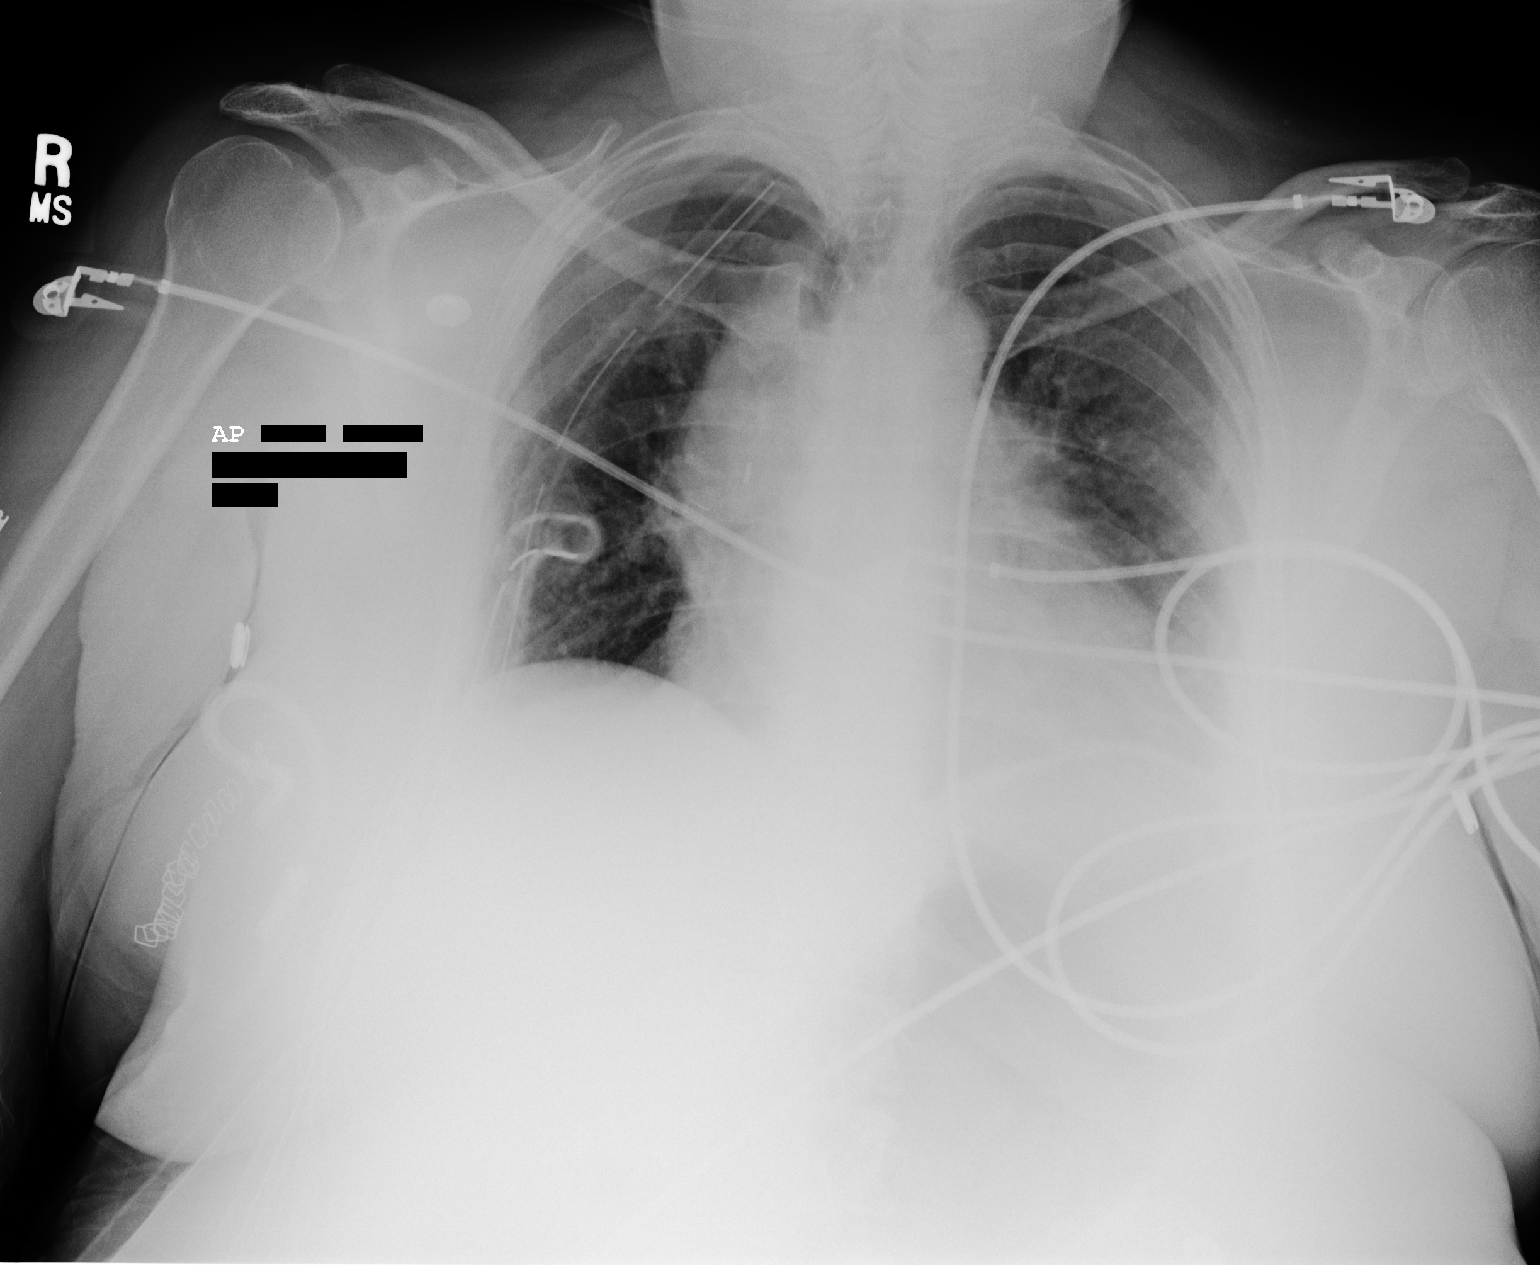

[1 of 1 positions shown; findings below may reference images not displayed]

PROCEDURE:     DXR - DXR PORTABLE CHEST SINGLE VIEW  - January 16, 2007  [DATE]

RESULT:     Comparison is made to the prior exam of 01-15-2007. Postoperative
changes are again noted on the RIGHT.  No pneumothorax or pleural effusion
is seen. Two RIGHT chest tubes are again observed. The LEFT lung field is
clear.
IMPRESSION: 1. No acute changes are identified.
2. Postoperative changes are again noted on the RIGHT.
3. Stable appearing chest as compared to the exam of yesterday.

## 2008-09-12 IMAGING — CR DG CHEST 1V PORT
1 series · 1 of 1 positions shown · non-contrast
Comparison: none

REASON FOR EXAM: Thoracotomy
COMMENTS:

PROCEDURE:     DXR - DXR PORTABLE CHEST SINGLE VIEW  - January 18, 2007  [DATE]
RESULT:     Comparison: 01/16/2007.

[view not recorded]
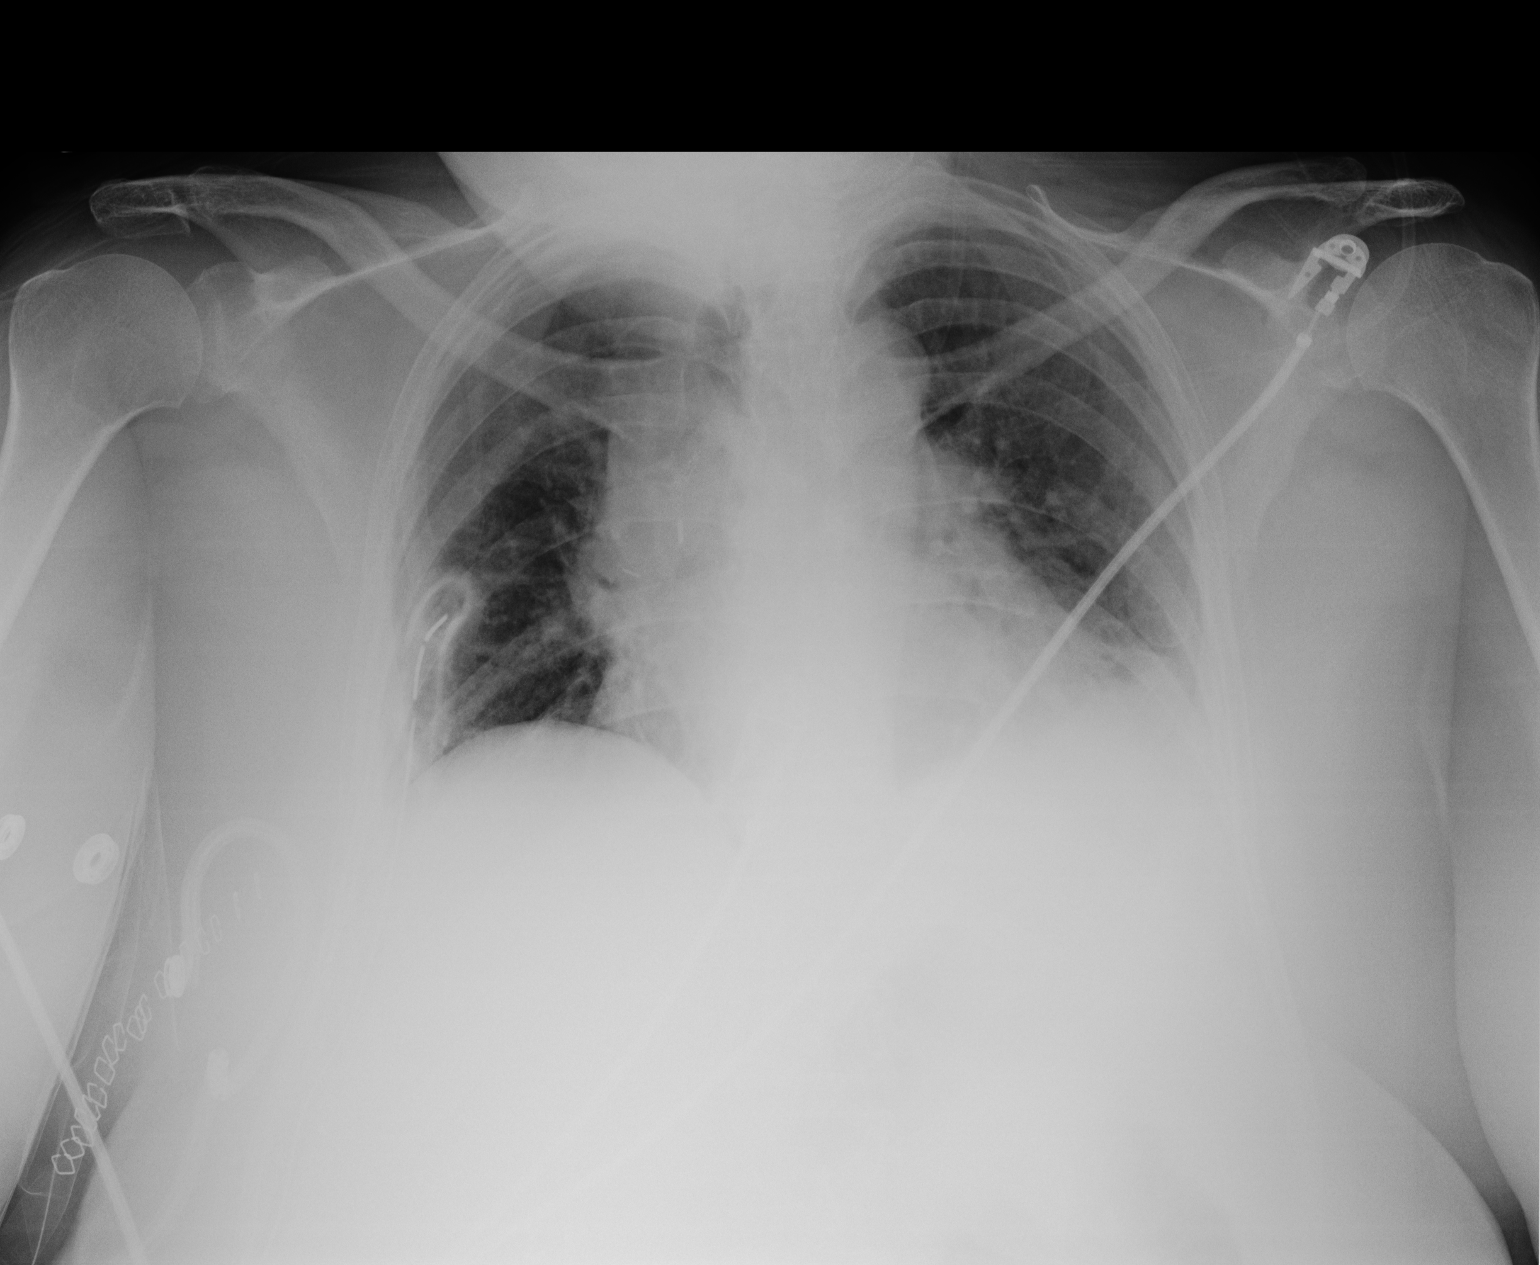

[1 of 1 positions shown; findings below may reference images not displayed]

FINDINGS: Low lung volumes. A right chest tube is noted. There is no definite
pneumothorax. Left basilar opacity is probably due to a small effusion and
atelectasis. Stable cardiomediastinal silhouette.
IMPRESSION: 1. Please see findings.

## 2008-09-12 IMAGING — CR DG CHEST 1V
1 series · 1 of 1 positions shown · non-contrast
Comparison: none

REASON FOR EXAM: post thoraqcotomy
COMMENTS:

PROCEDURE:     DXR - DXR CHEST 1 VIEWAP OR PA  - January 18, 2007  [DATE]
RESULT:     Comparison: 01/18/07 at [DATE].

[view not recorded]
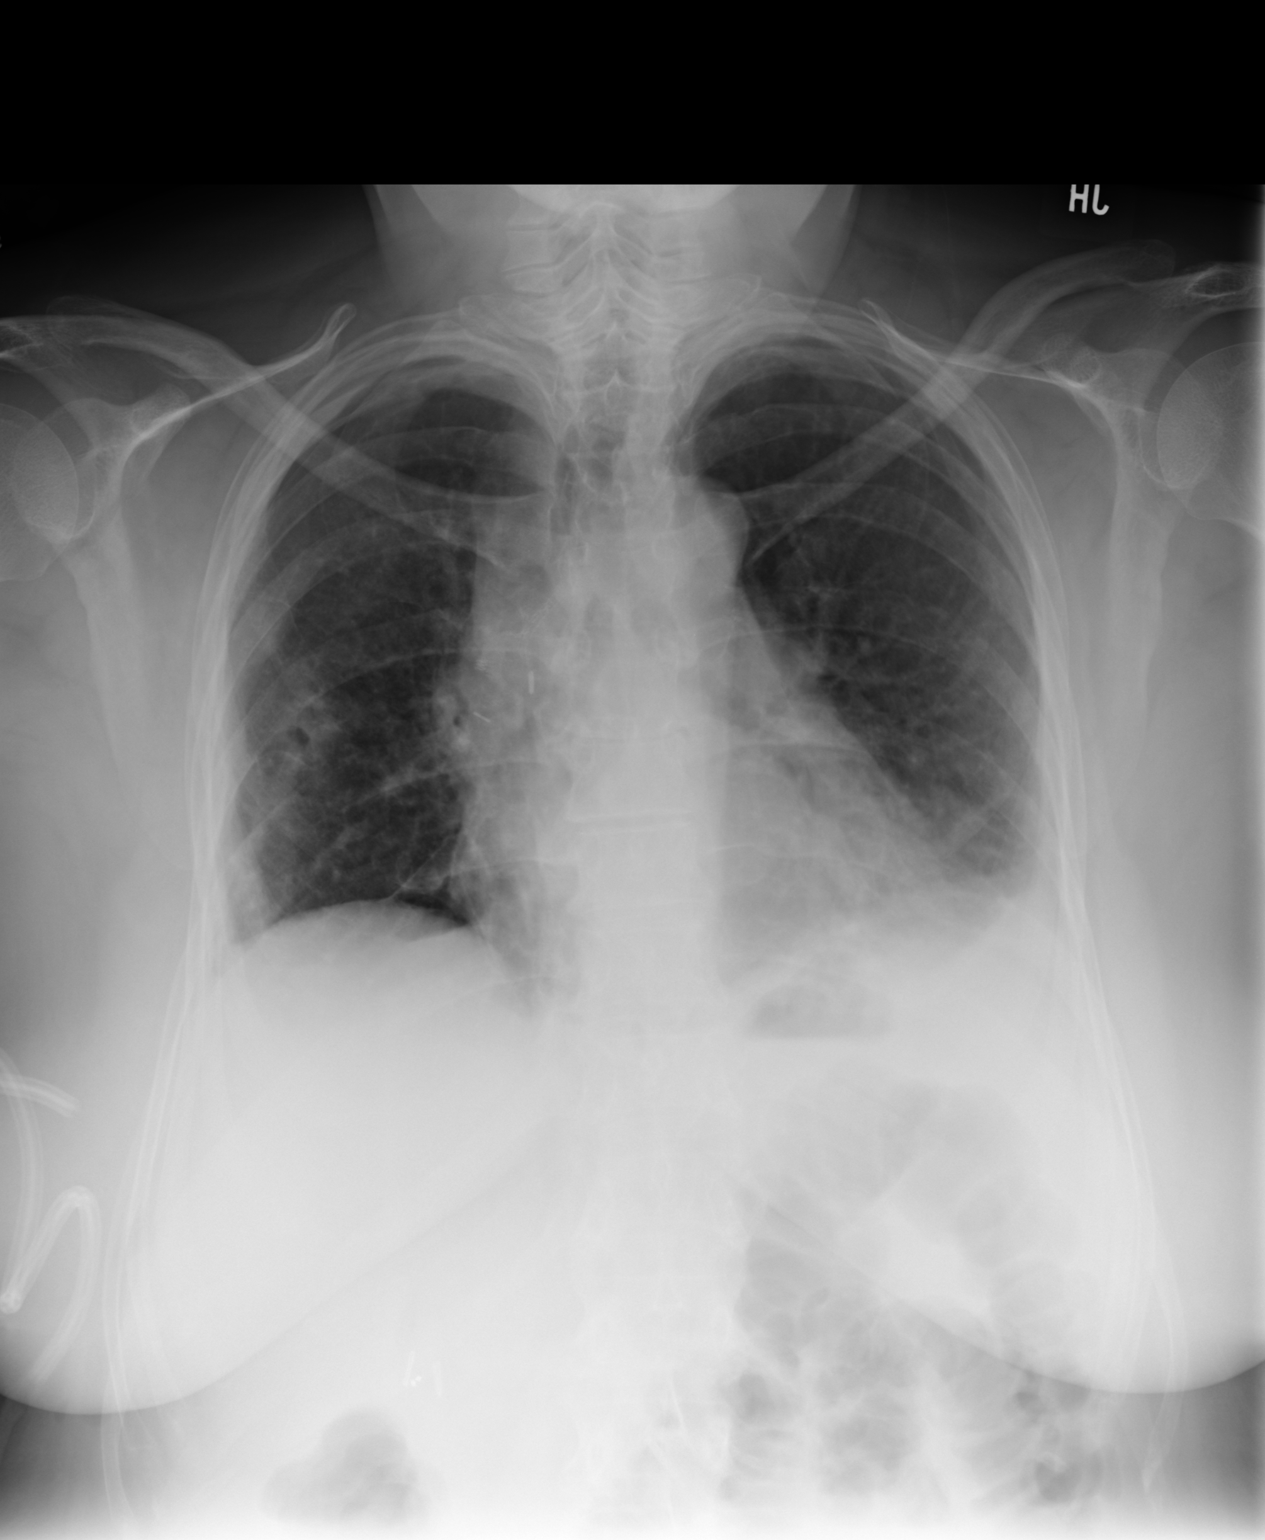

[1 of 1 positions shown; findings below may reference images not displayed]

FINDINGS: Interval removal of right chest tube. There is a tiny right apical
pneumothorax. Low lung volumes. Mild right mid hemithorax opacity is likely
related to prior chest tube. Redemonstration of small left pleural effusion.
No significant pulmonary edema. Stable cardiomediastinal silhouette.
IMPRESSION: 1. Interval removal of right chest tube. There is a tiny right apical
pneumothorax.

## 2008-09-14 IMAGING — CR DG ABDOMEN 2V
1 series · 3 of 3 positions shown · non-contrast
Comparison: none

REASON FOR EXAM: Ileus
COMMENTS:

[Series 1: view not recorded · 0.17mm/px · 3 of 3 slices shown]
[im 1/3]
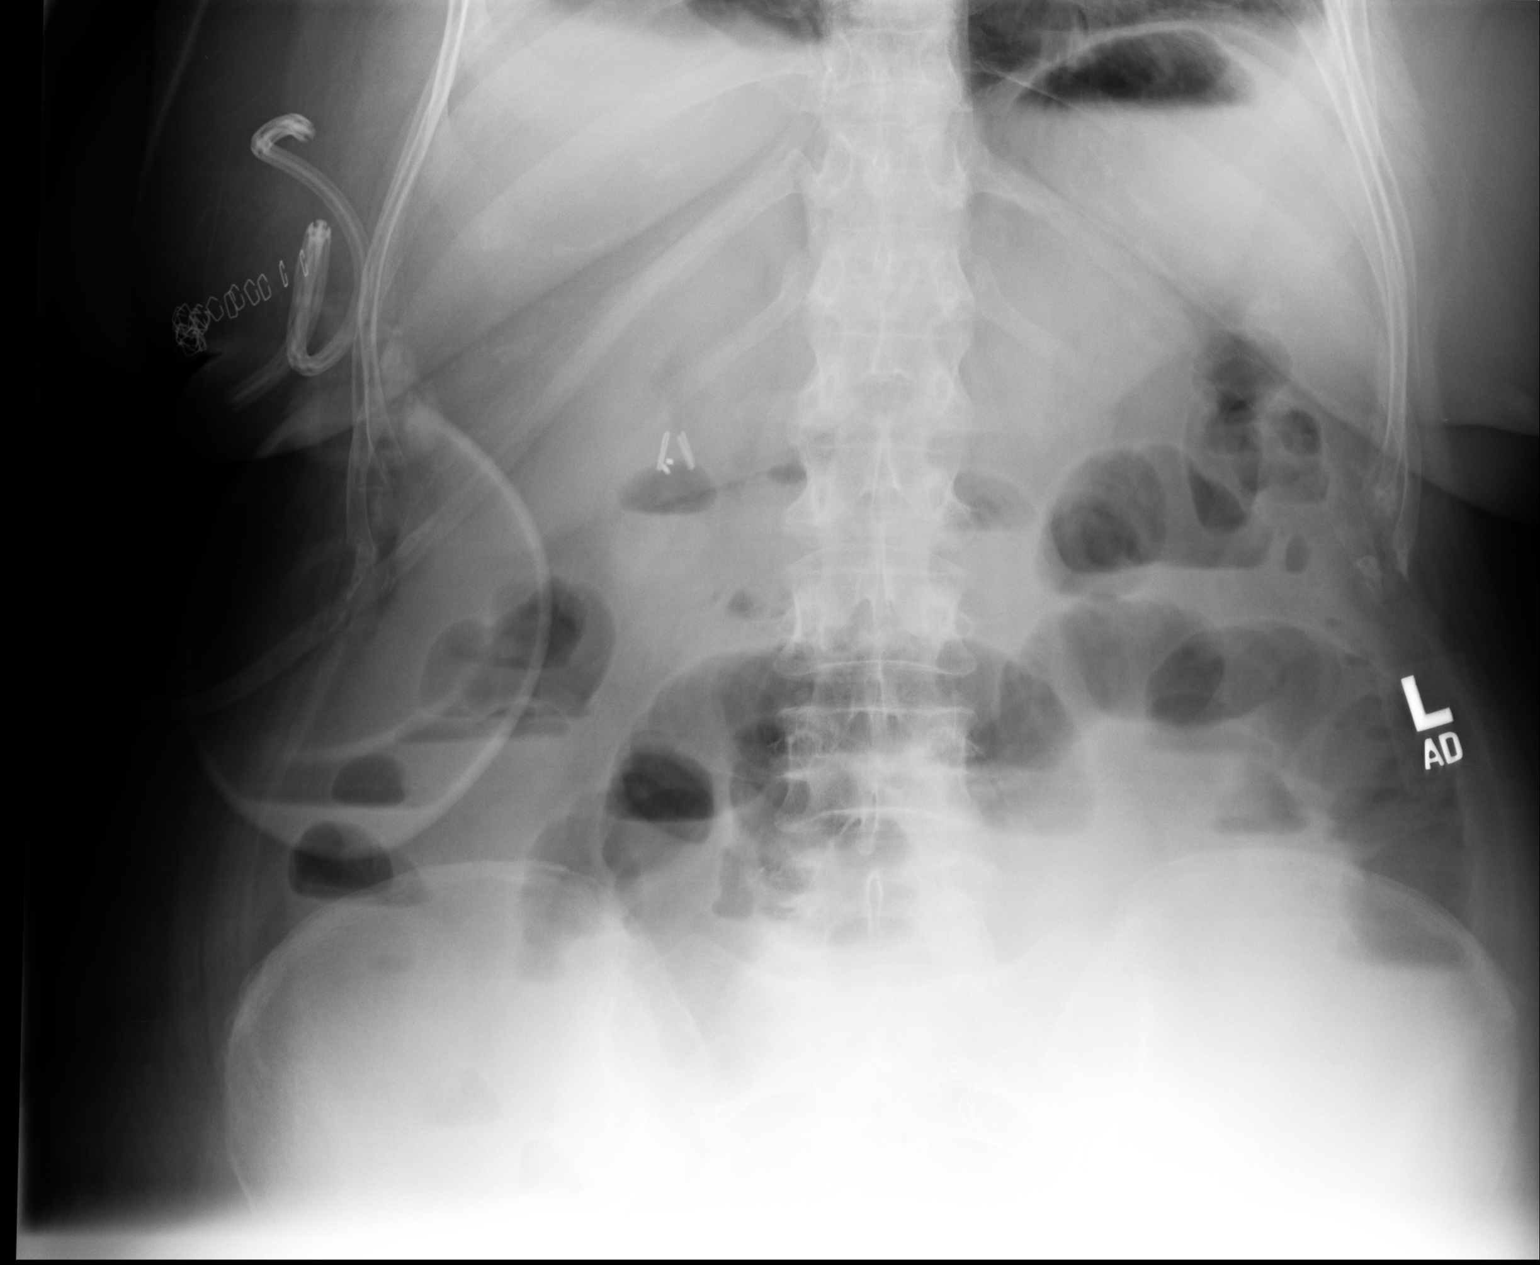
[im 2/3]
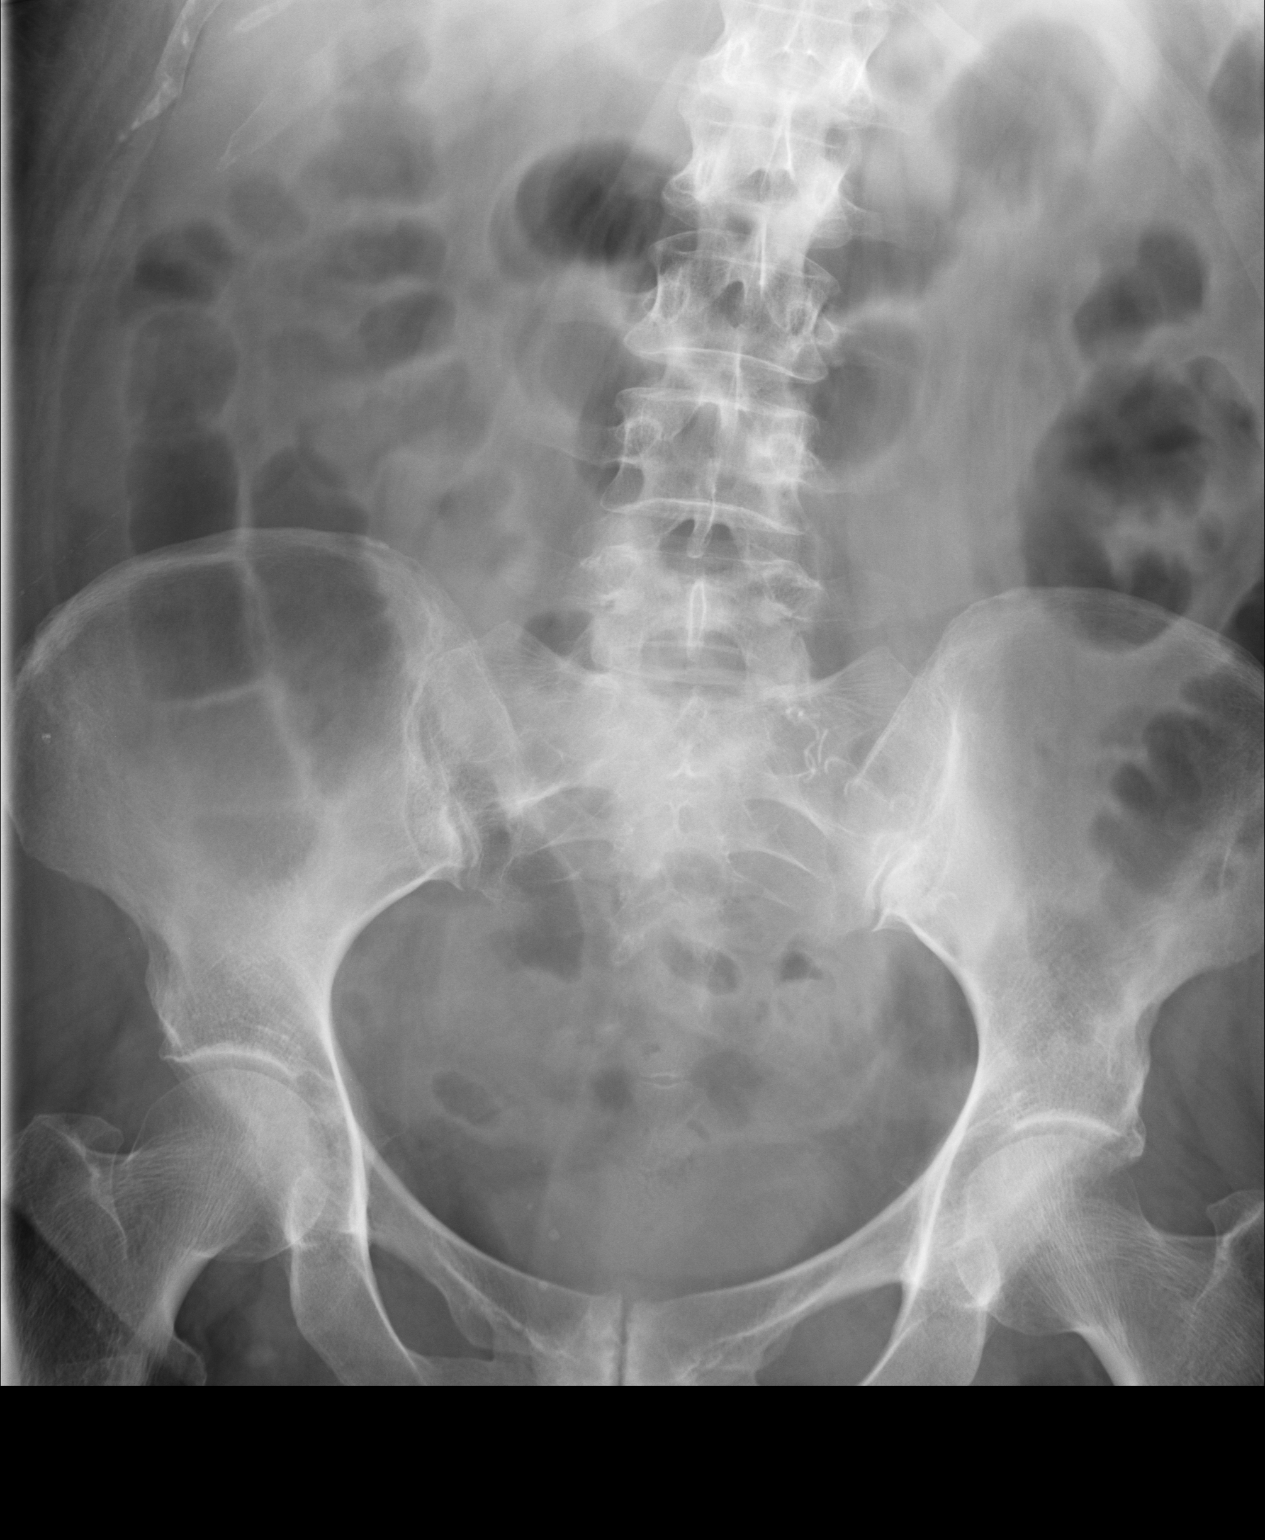
[im 3/3]
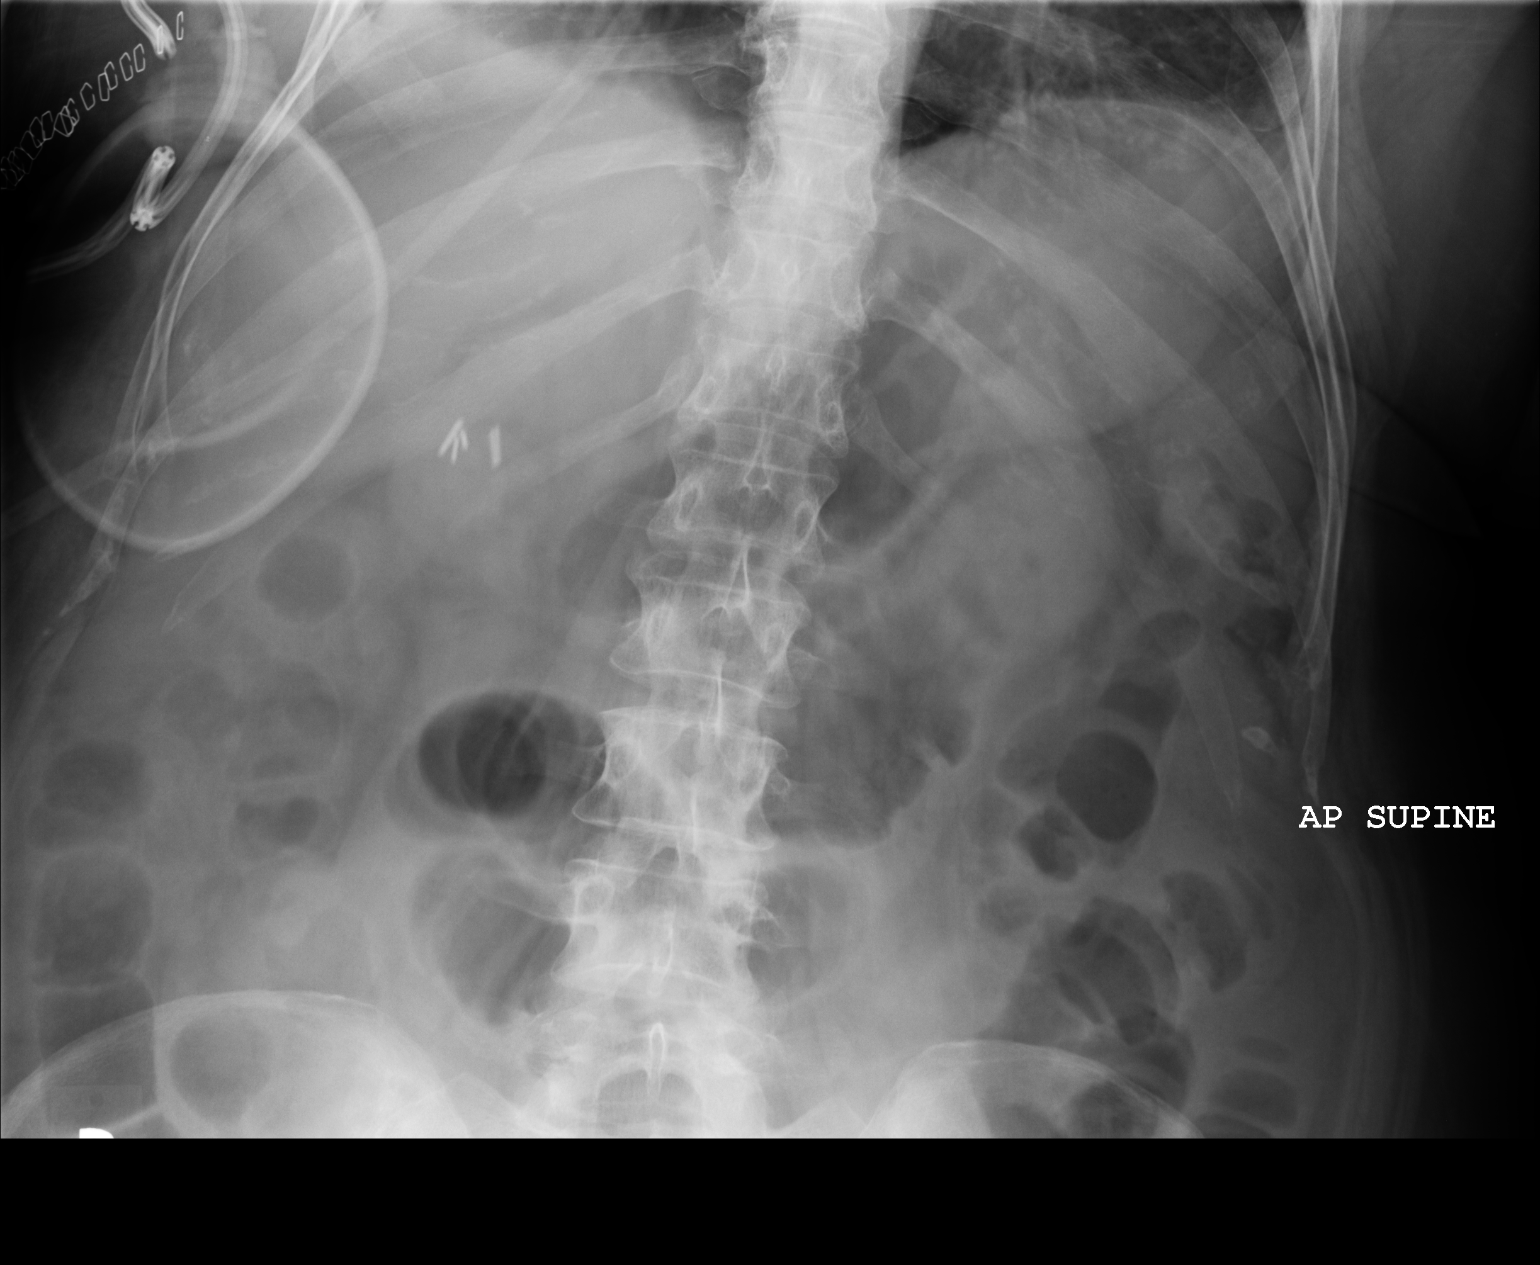

[3 of 3 positions shown; findings below may reference images not displayed]

PROCEDURE:     DXR - DXR ABDOMEN 2 V FLAT AND ERECT  - January 20, 2007 [DATE]

RESULT:     No subdiaphragmatic free air is seen. There are a few,
nonspecific, scattered small bowel fluid levels in mildly dilated loops of
small bowel. The findings are most compatible with an ileus pattern. Gas is
visualized in the colon. No abnormal intra-abdominal calcifications are
seen. Post-operative metallic clips are noted in the region of the
gallbladder bed.
IMPRESSION: Small bowel air/fluid levels are observed and are
consistent with a small bowel ileus pattern.

## 2008-09-14 IMAGING — CR DG CHEST 2V
1 series · 2 of 2 positions shown · non-contrast
Comparison: none

REASON FOR EXAM: Thoracotomy
COMMENTS:

[Series 1: view not recorded · 0.17mm/px · 2 of 2 slices shown]
[im 1/2]
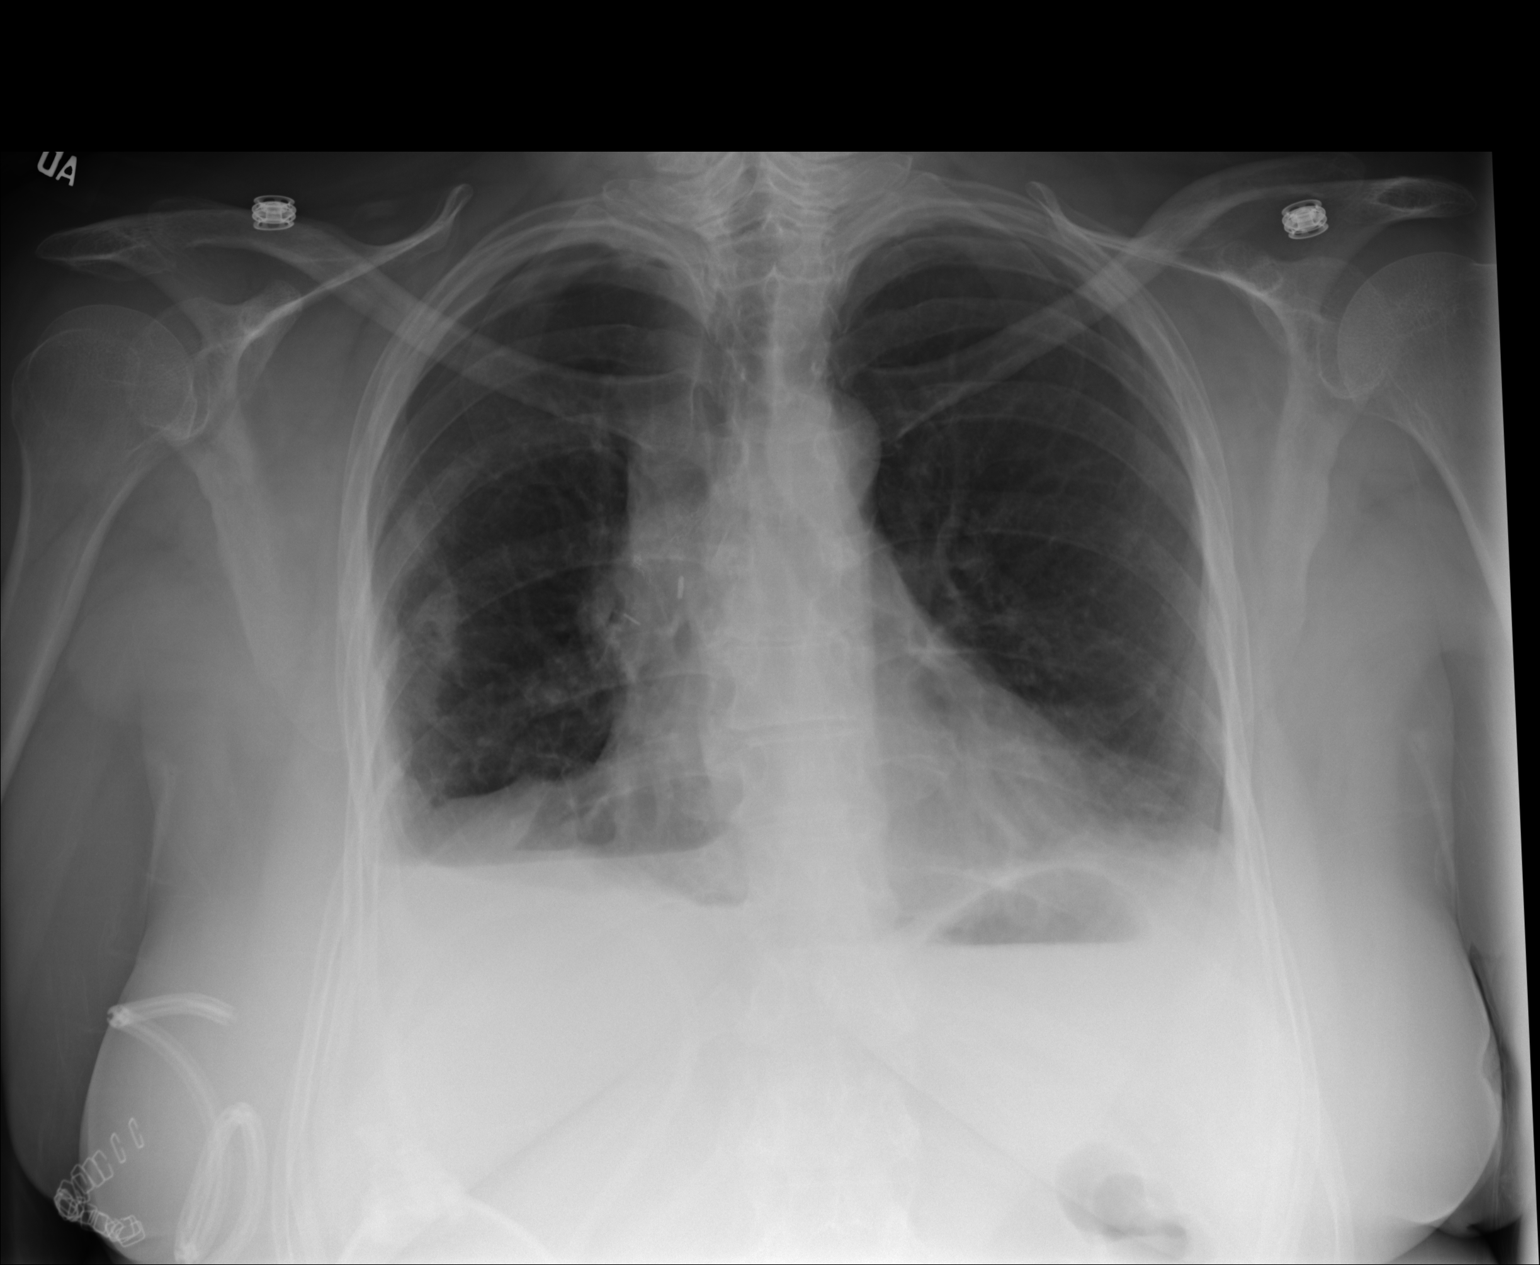
[im 2/2]
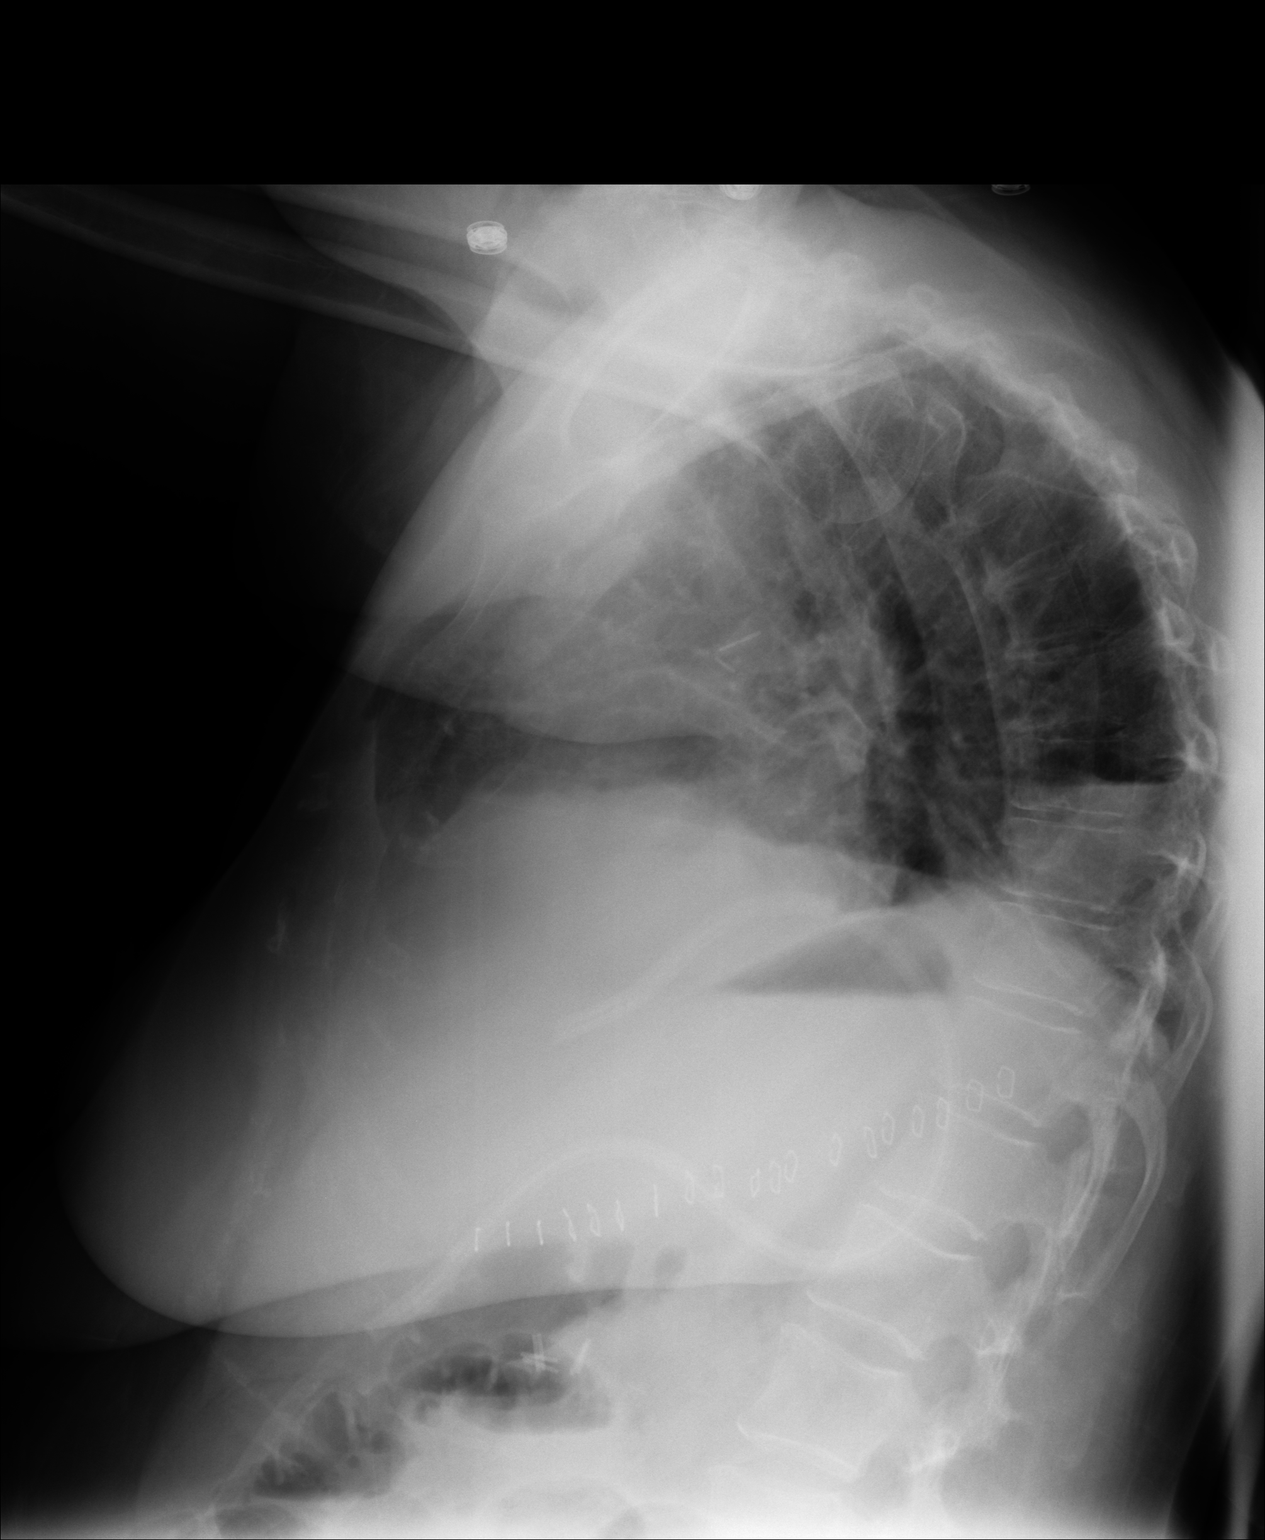

[2 of 2 positions shown; findings below may reference images not displayed]

PROCEDURE:     DXR - DXR CHEST PA (OR AP) AND LATERAL  - January 20, 2007 [DATE]

RESULT:     Comparison is made to a prior exam of 01/18/2007.

There is a 7.0 mm pneumothorax at the RIGHT apex. This is similar to that
noted on a similar exam of 01/18/2007. The current exam shows small,
bibasilar pleural effusions. In addition, there is mild atelectasis at the
RIGHT lung base. The heart size is normal.
IMPRESSION: 1.  Small residual pneumothorax on the RIGHT.
2.  Bibasilar pleural effusions are noted.
3.  There is atelectasis at the RIGHT lung base.

## 2008-09-15 IMAGING — CR DG CHEST 2V
1 series · 2 of 2 positions shown · non-contrast
Comparison: none

REASON FOR EXAM: PTX
COMMENTS:

[Series 1: view not recorded · 0.17mm/px · 2 of 2 slices shown]
[im 1/2]
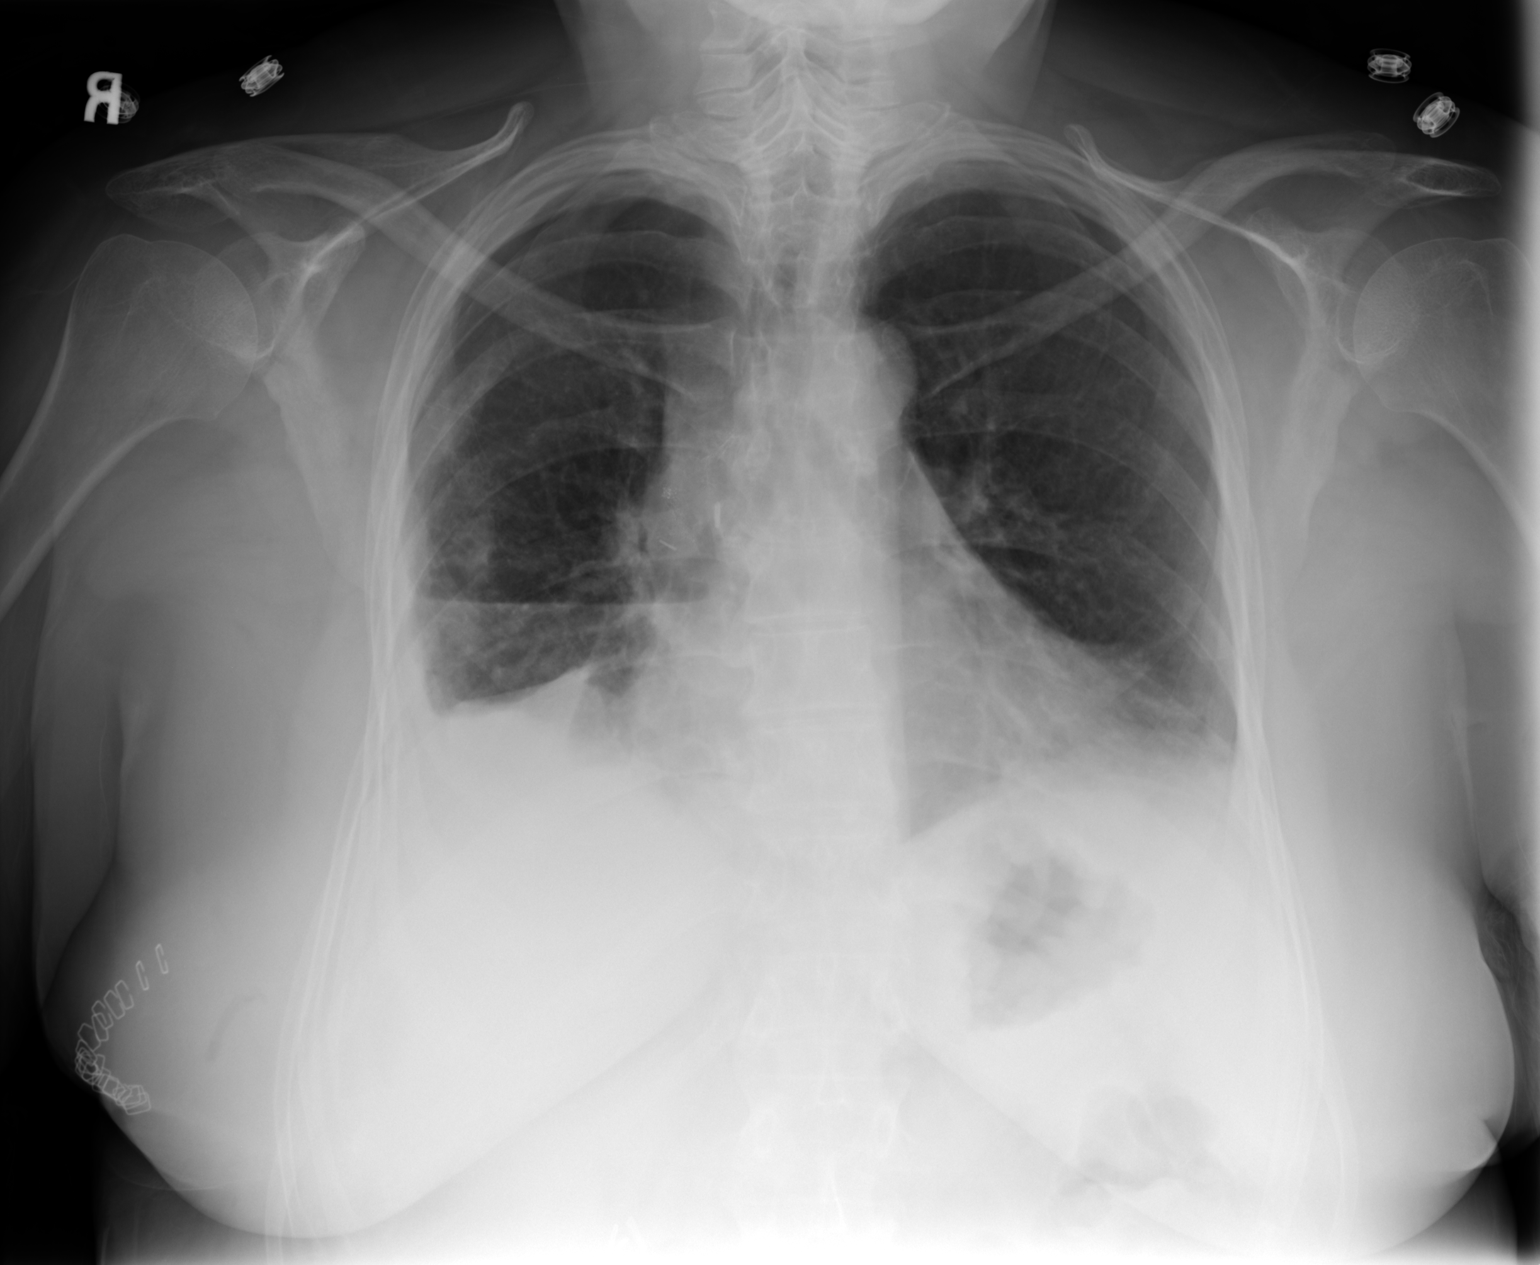
[im 2/2]
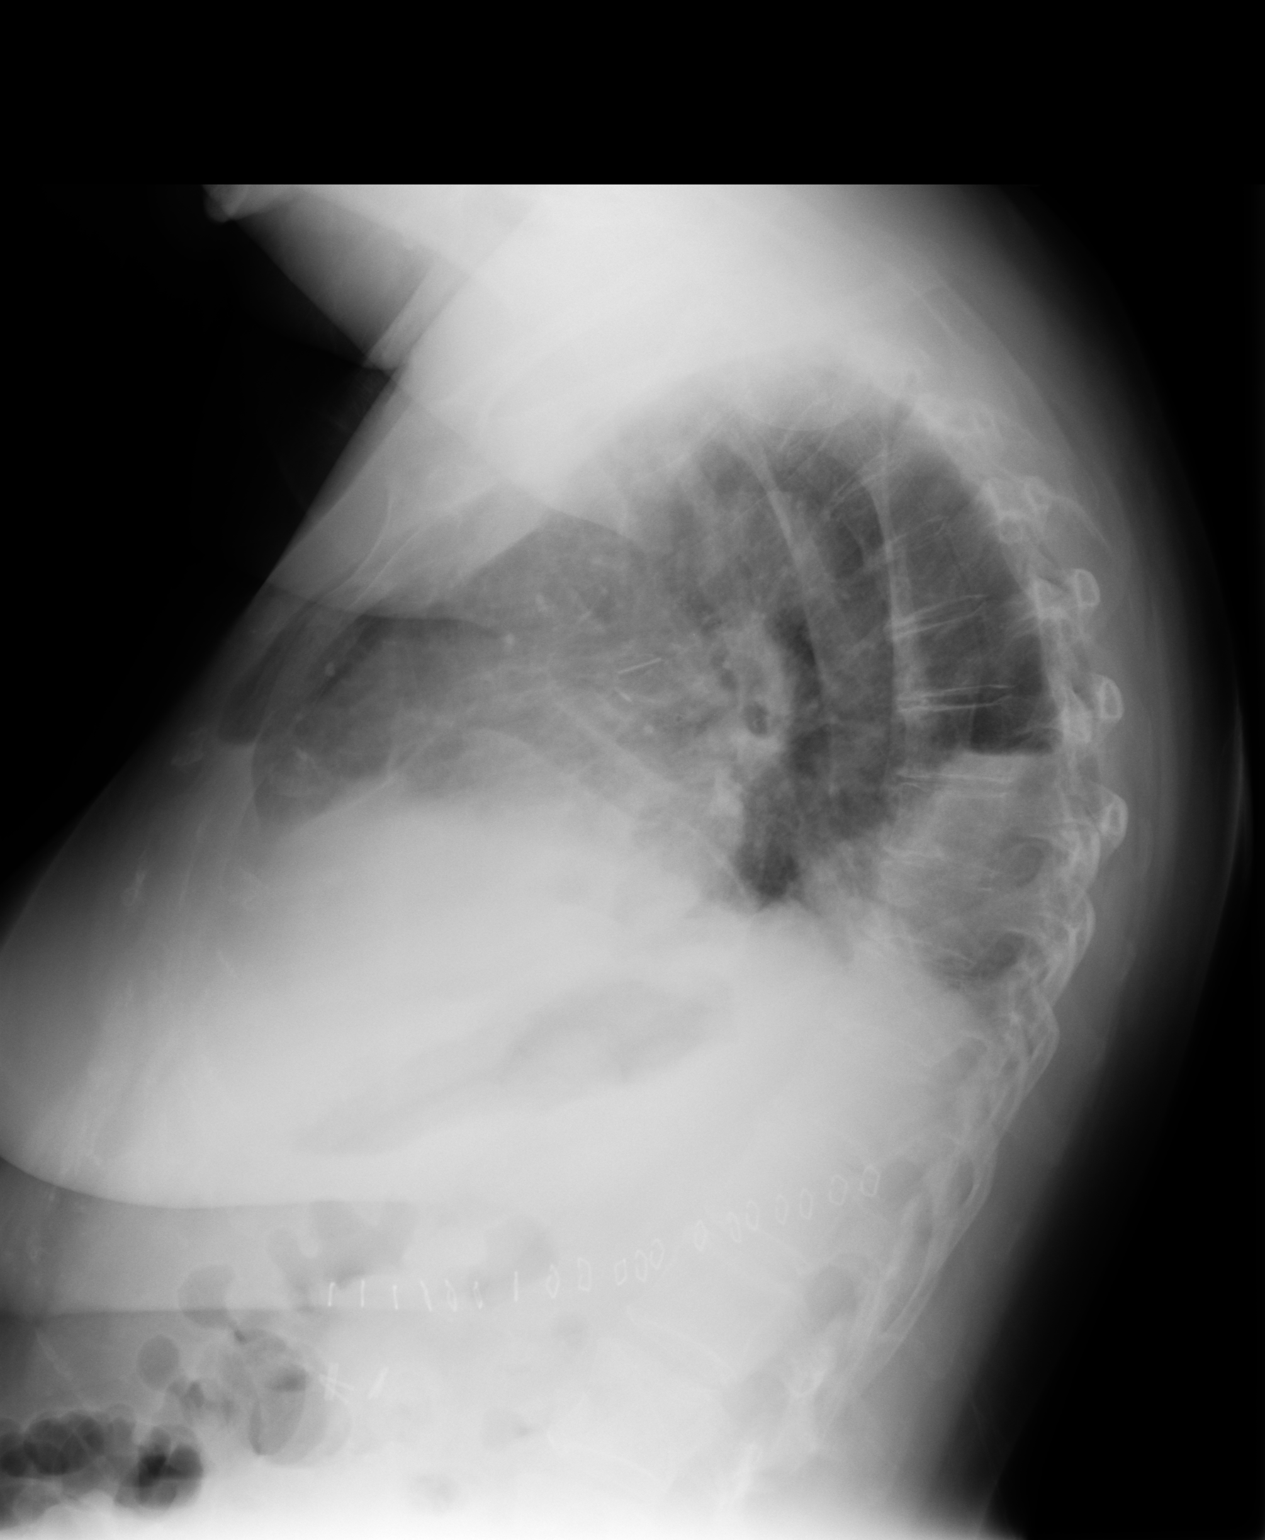

[2 of 2 positions shown; findings below may reference images not displayed]

PROCEDURE:     DXR - DXR CHEST PA (OR AP) AND LATERAL  - January 21, 2007 [DATE]

RESULT:     Comparison is made to the prior exam of 01/20/07. A trace RIGHT
pneumothorax is again seen. No pneumothorax is noted on the LEFT. There is a
RIGHT pleural effusion which appears slightly more prominent than on the
exam of yesterday. Atelectasis is present at the LEFT base. The heart size
is within limits of normal.
IMPRESSION: 1. Persistent trace RIGHT pneumothorax at the RIGHT upper lobe.
2. RIGHT pleural effusion, slightly more prominent than on the exam of
yesterday.
3. There is mild atelectasis at the LEFT lung base.

## 2008-09-27 ENCOUNTER — Ambulatory Visit: Payer: Self-pay | Admitting: Oncology

## 2008-09-29 IMAGING — CR DG CHEST 2V
1 series · 2 of 2 positions shown · non-contrast
Comparison: none

REASON FOR EXAM: s/p rt thoracotomy, lung ca
COMMENTS:

[Series 1: view not recorded · 0.17mm/px · 2 of 2 slices shown]
[im 1/2]
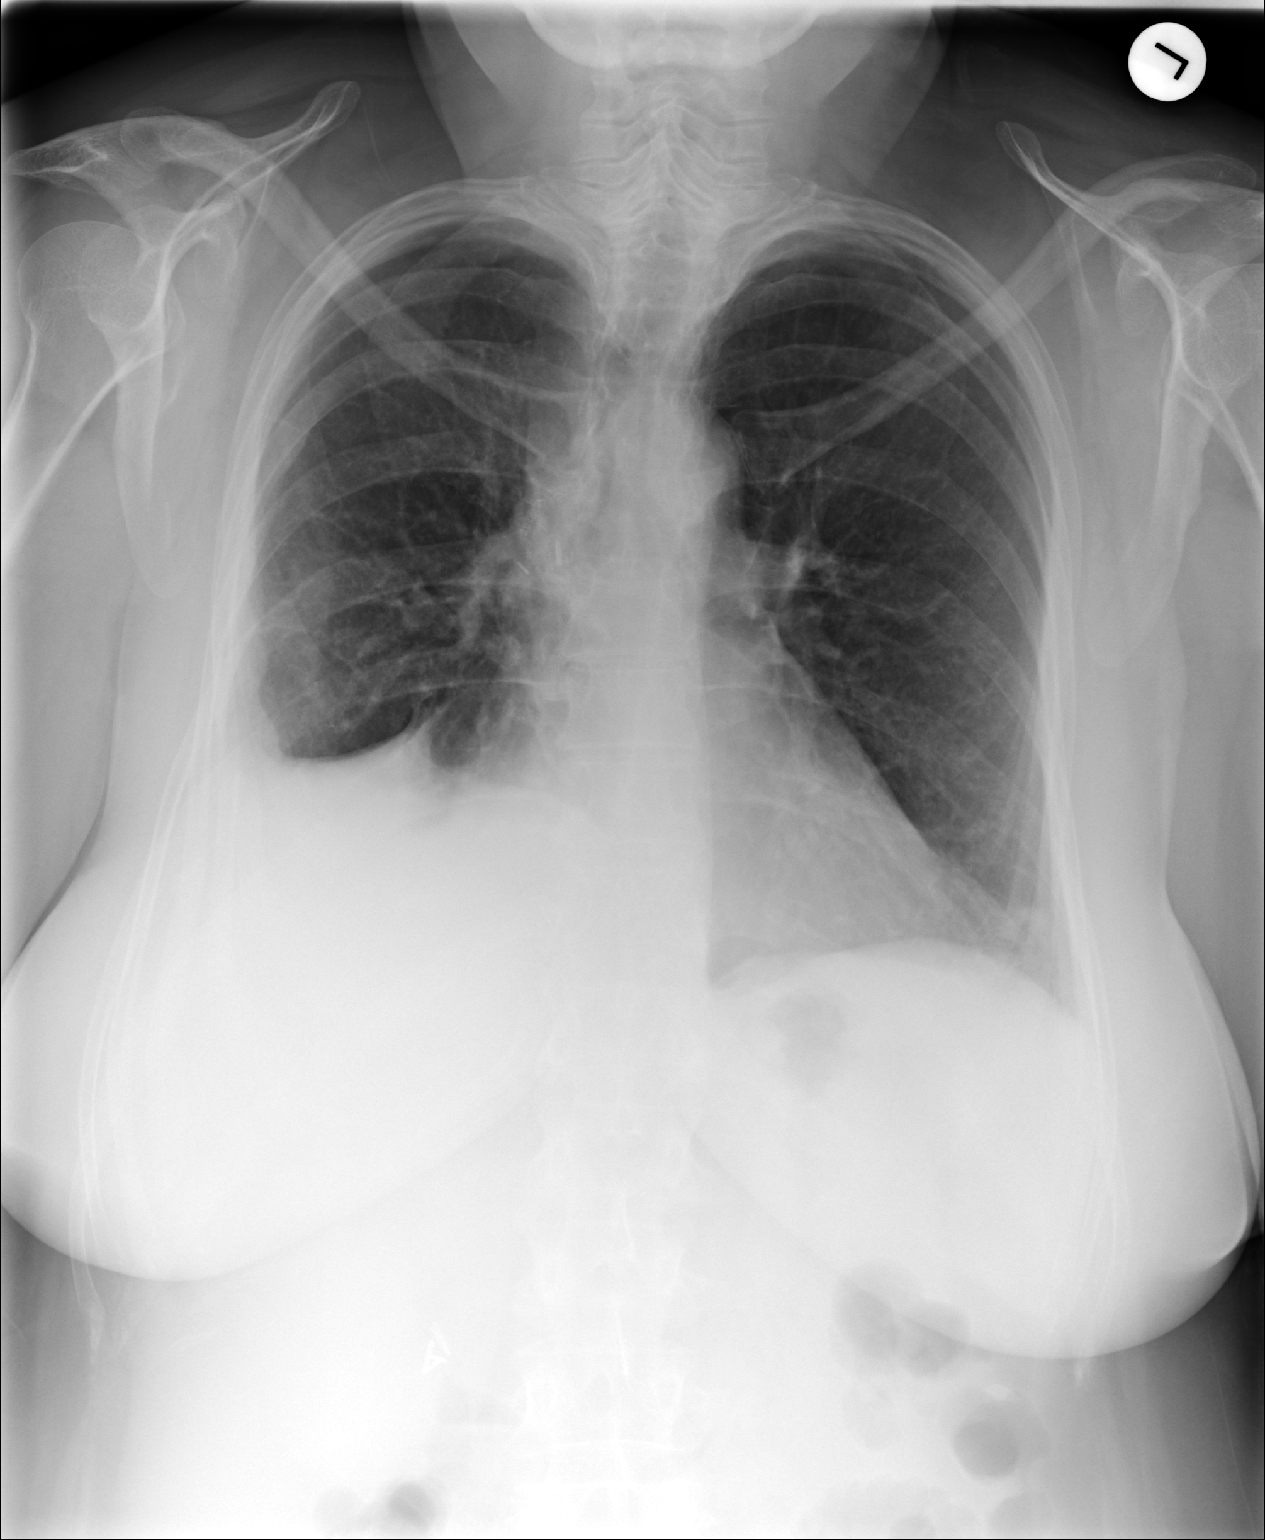
[im 2/2]
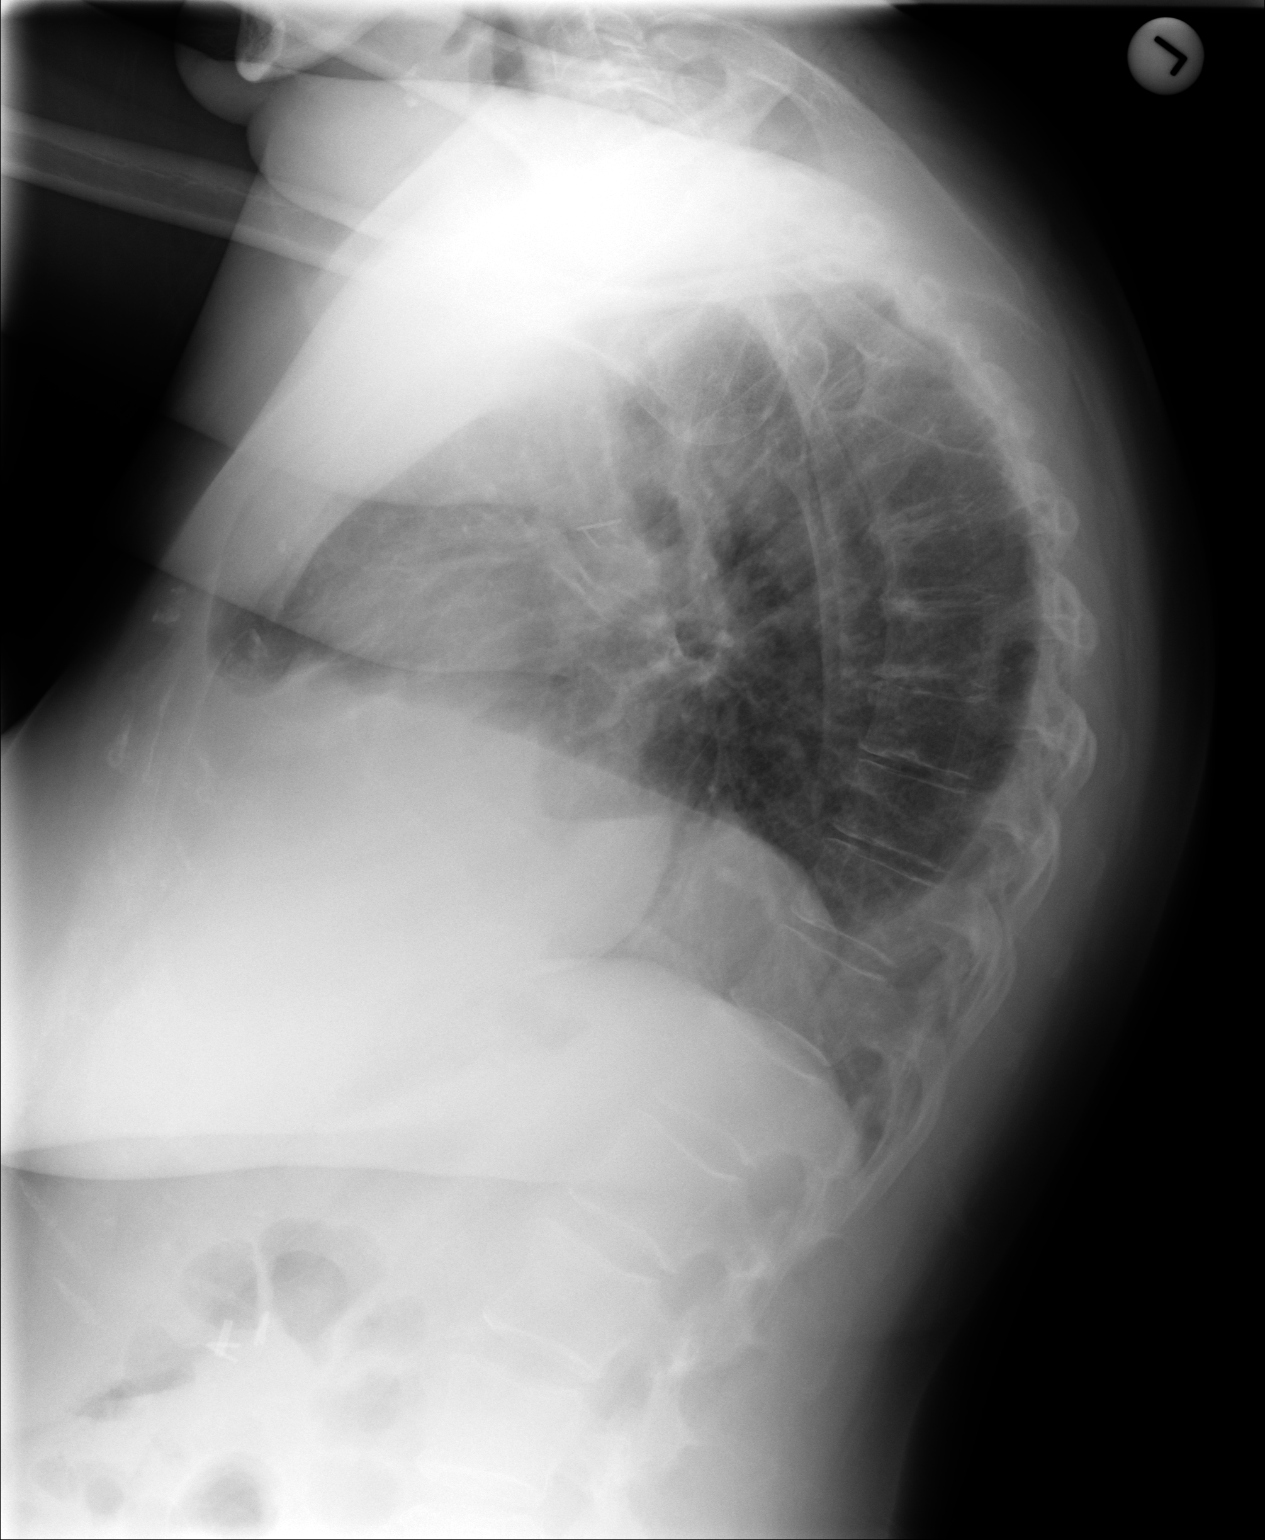

[2 of 2 positions shown; findings below may reference images not displayed]

PROCEDURE:     MDR - MDR CHEST PA(OR AP) AND LATERAL  - February 04, 2007  [DATE]

RESULT:     PA and lateral views of chest were obtained and are compared to
the prior exam of 01/27/07. A small RIGHT pleural effusion persists and
appears essentially unchanged as compared to the exam of 01/27/07. The lung
fields are clear of infiltrate. The heart size is normal.
IMPRESSION: There is thickening of the pleural shadow on the RIGHT most compatible with
small residual RIGHT pleural effusion, however, pleural fibrosis could
produce similar findings. There has been considerable improvement since the
exam of 01/21/2007. The chest is essentially stable as compared to the exam
01/27/2007.

## 2008-10-09 ENCOUNTER — Ambulatory Visit: Payer: Self-pay | Admitting: Oncology

## 2008-10-28 ENCOUNTER — Ambulatory Visit: Payer: Self-pay | Admitting: Internal Medicine

## 2008-11-03 IMAGING — CR DG CHEST 1V PORT
1 series · 1 of 1 positions shown · non-contrast
Comparison: none

REASON FOR EXAM: Line placement
COMMENTS:

[view not recorded]
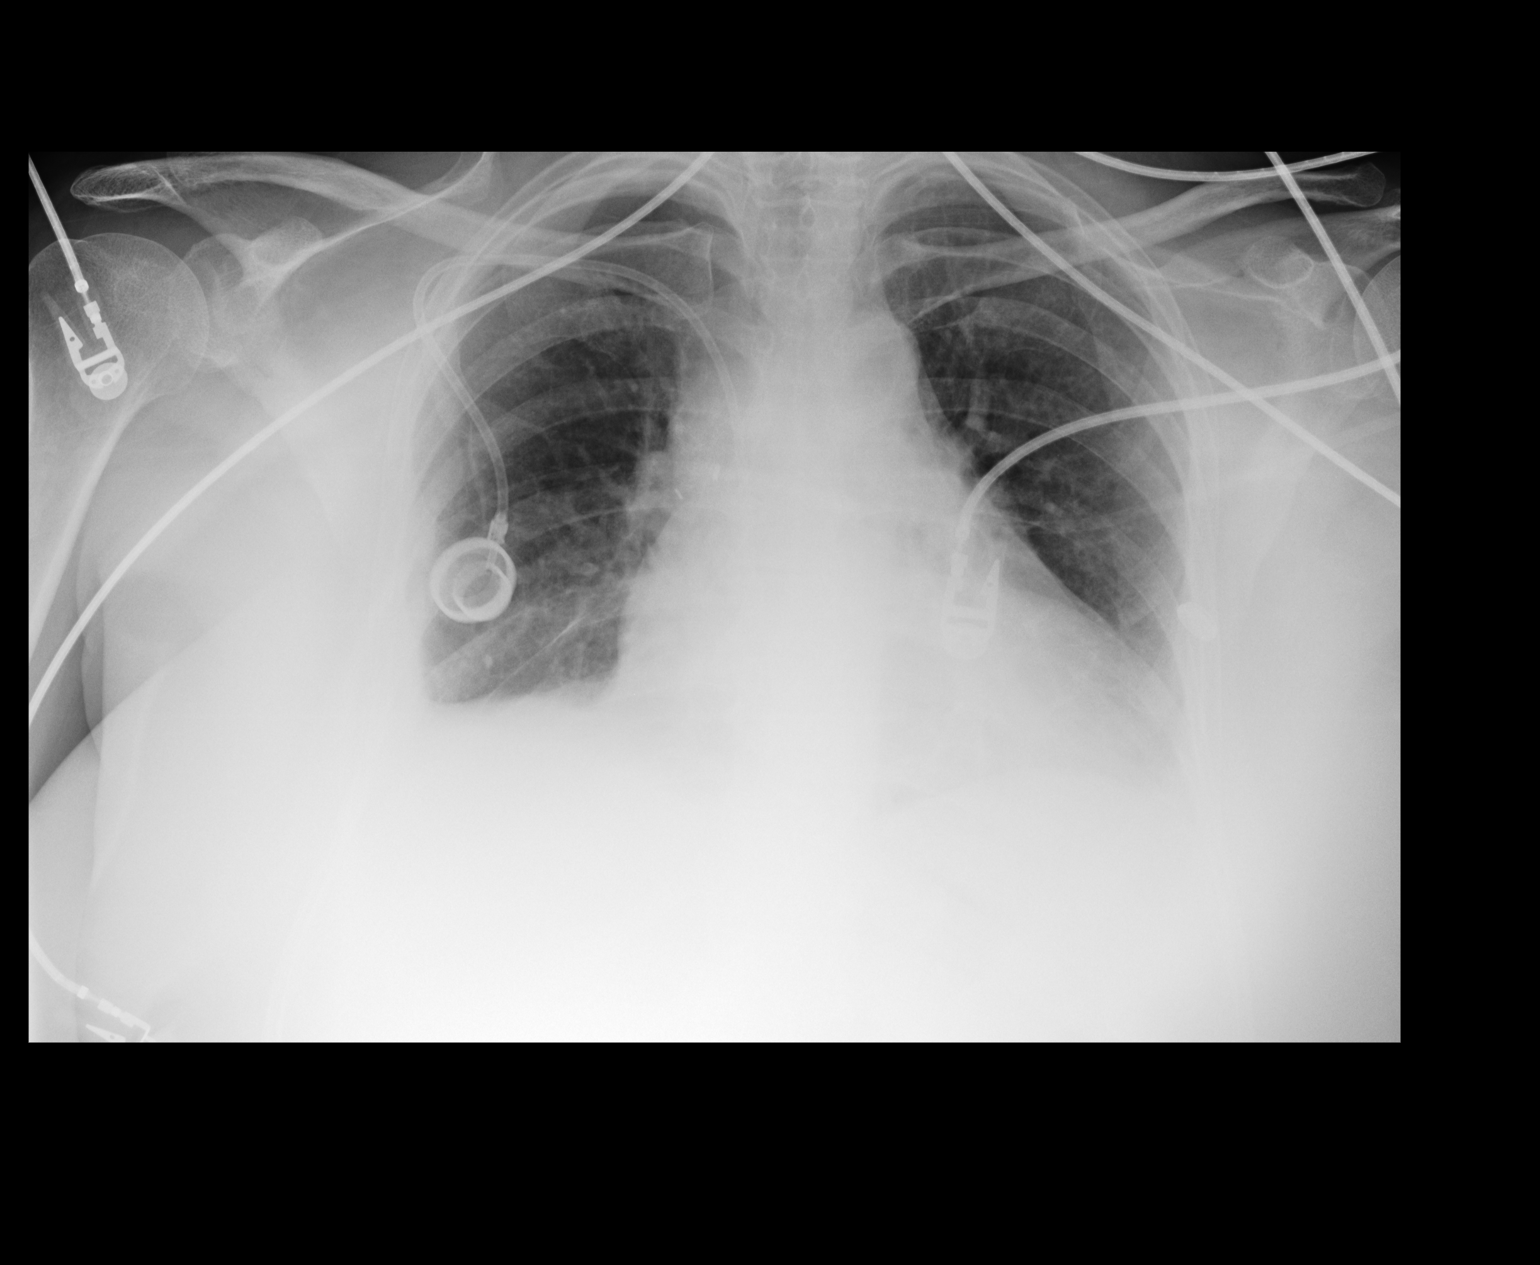

[1 of 1 positions shown; findings below may reference images not displayed]

PROCEDURE:     DXR - DXR PORTABLE CHEST SINGLE VIEW  - March 11, 2007 [DATE]

RESULT:     Comparison is made to a study of 02/04/2007.

There remains mildly decreased lung volume on the RIGHT. On the LEFT, the
lung is better inflated. The cardiac silhouette is enlarged. I do not see
definite evidence of a pleural effusion on the LEFT. This is likely a small
amount of pleural fluid on the RIGHT. There is a Port-A-Cath appliance in
place on the RIGHT with no definite evidence of a post procedure
complication.
IMPRESSION: I do not see definite evidence of a pneumothorax or other
post procedure complication following placement of the Port-A-Cath appliance
on the RIGHT. The tip of the catheter lies in the region of the mid to
distal SVC.

## 2008-11-09 ENCOUNTER — Ambulatory Visit: Payer: Self-pay | Admitting: Oncology

## 2008-12-09 ENCOUNTER — Ambulatory Visit: Payer: Self-pay | Admitting: Oncology

## 2008-12-27 ENCOUNTER — Ambulatory Visit: Payer: Self-pay | Admitting: Oncology

## 2009-01-09 ENCOUNTER — Ambulatory Visit: Payer: Self-pay | Admitting: Oncology

## 2009-04-11 ENCOUNTER — Ambulatory Visit: Payer: Self-pay | Admitting: Oncology

## 2009-04-25 ENCOUNTER — Ambulatory Visit: Payer: Self-pay | Admitting: Oncology

## 2009-05-11 ENCOUNTER — Ambulatory Visit: Payer: Self-pay | Admitting: Oncology

## 2009-06-11 ENCOUNTER — Ambulatory Visit: Payer: Self-pay | Admitting: Oncology

## 2009-06-24 IMAGING — CT NM PET TUM IMG RESTAG (PS) SKULL BASE T - THIGH
1 of 5 series · 1 of 25 positions shown · non-contrast
Comparison: none

REASON FOR EXAM: colon cancer
COMMENTS:

PROCEDURE:     PET - PET/CT COLORECTAL CA RESTG  - October 30, 2007  [DATE]
RESULT:
HISTORY: Colon cancer.

[Series 3: ct wb 3.0 b30f · axial · 3.0mm · 0.98mm/px · 1 of 435 slices shown]
[im 435/435  brain]
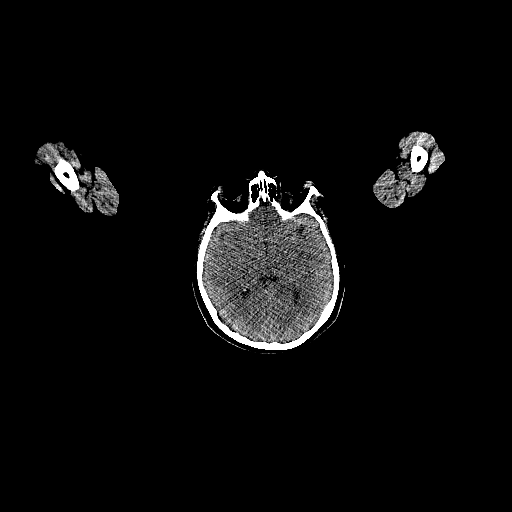

[1 of 25 positions shown; findings below may reference images not displayed]

PROCEDURE AND FINDINGS:  Following determination of a fasting blood sugar of
106 mg/dl, 13.43 mCi of F18 FDG was administered to the patient and whole
body PET CT obtained. CT was obtained for attenuation correction and fusion.
 Noted in the region of the anorectal area is an area of increased FDG
uptake with an SUV of approximately 4.7.  Clinical correlation is suggested
to exclude anorectal cancer.  No other pathologic focal abnormalities are
identified.
IMPRESSION: 1.     Increased anorectal uptake with SUV of 4.7.  Direct visualization is
suggested to exclude an anorectal cancer.
2.     No other clear-cut focal lesion is identified.  Extensive bowel
activity was noted.

## 2009-07-12 ENCOUNTER — Ambulatory Visit: Payer: Self-pay | Admitting: Oncology

## 2009-07-27 ENCOUNTER — Ambulatory Visit: Payer: Self-pay | Admitting: Oncology

## 2009-08-09 ENCOUNTER — Ambulatory Visit: Payer: Self-pay | Admitting: Oncology

## 2009-09-09 ENCOUNTER — Ambulatory Visit: Payer: Self-pay | Admitting: Oncology

## 2009-09-29 ENCOUNTER — Ambulatory Visit: Payer: Self-pay | Admitting: Surgery

## 2009-09-30 IMAGING — CT CT ABDOMEN W/ CM
1 of 2 series · 15 of 32 positions shown, 19 images · non-contrast
Comparison: none

REASON FOR EXAM: high Liver enzymes     Colon CA
COMMENTS:

[Series 2: abdomen · axial · 0.68mm/px · z∈[-78,+146]mm · 15 of 32 slices shown, 19 images]
[im 2/32  soft-tissue]
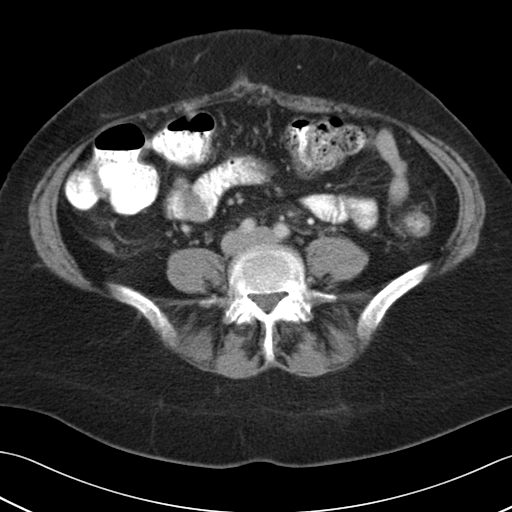
[im 2/32  bone]
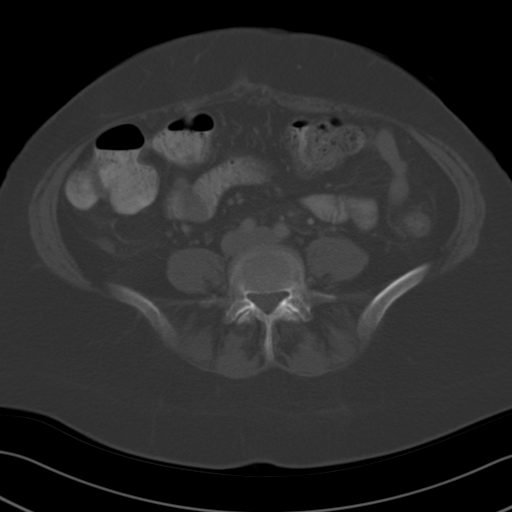
[im 5/32  soft-tissue]
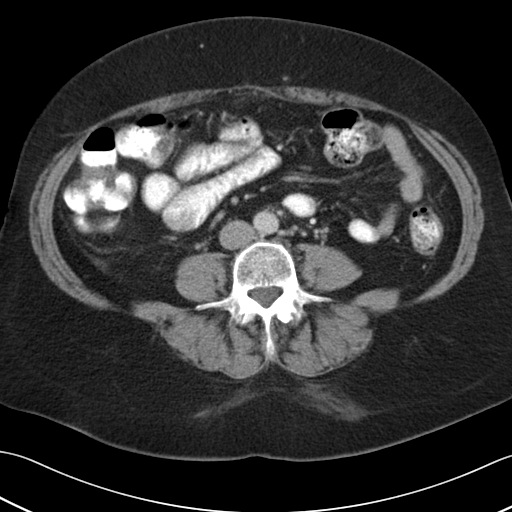
[im 8/32  soft-tissue]
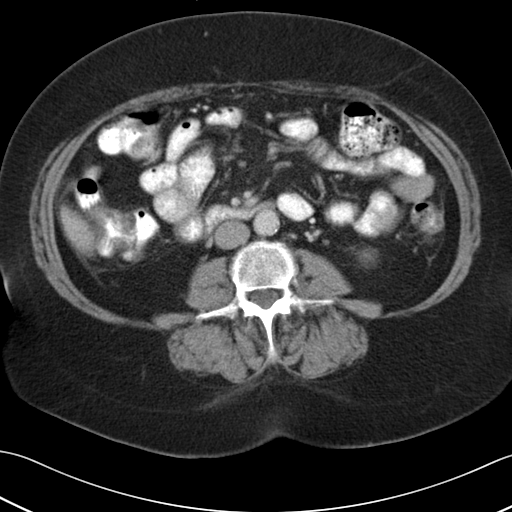
[im 9/32  soft-tissue]
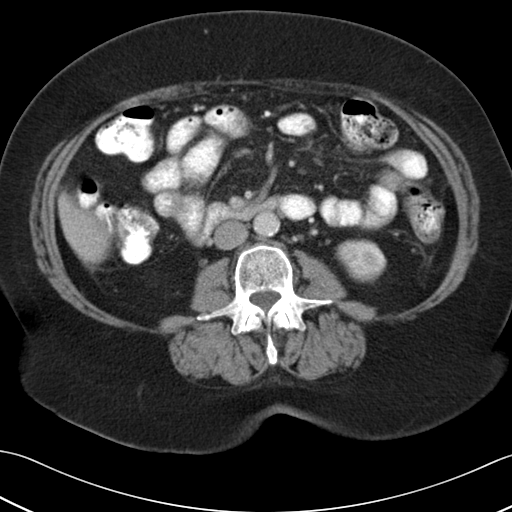
[im 12/32  soft-tissue]
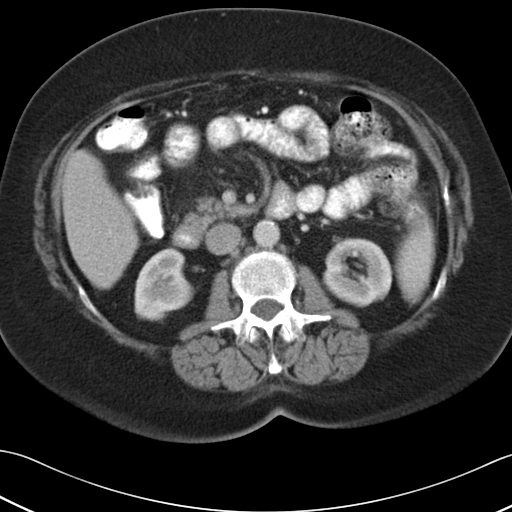
[im 13/32  soft-tissue]
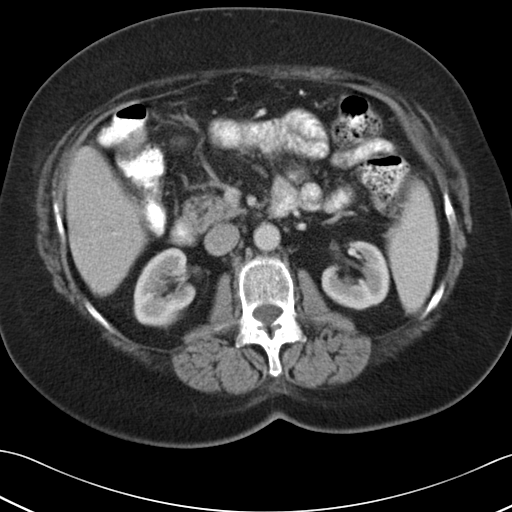
[im 16/32  soft-tissue]
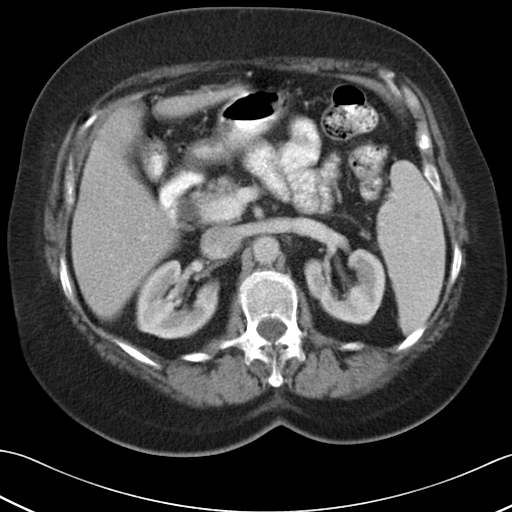
[im 19/32  soft-tissue]
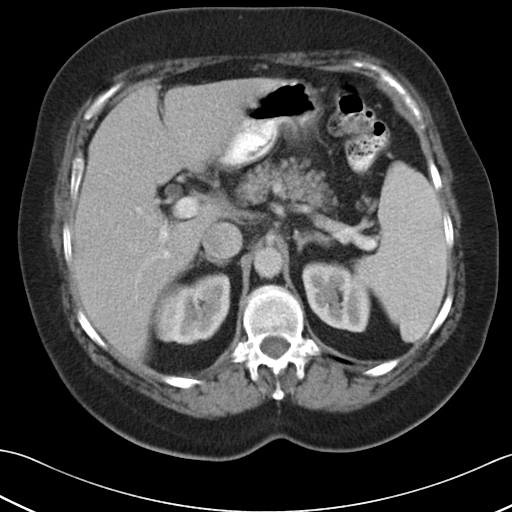
[im 20/32  soft-tissue]
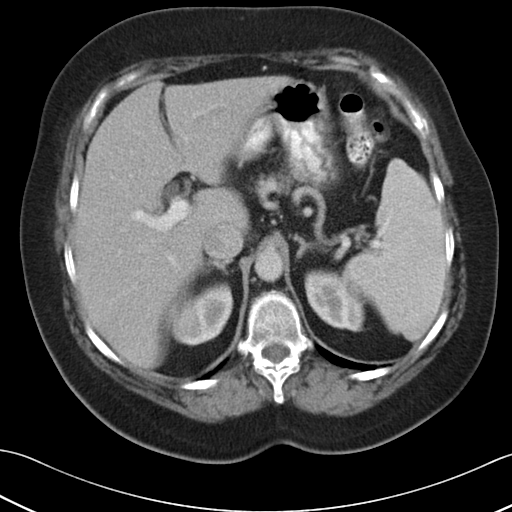
[im 20/32  bone]
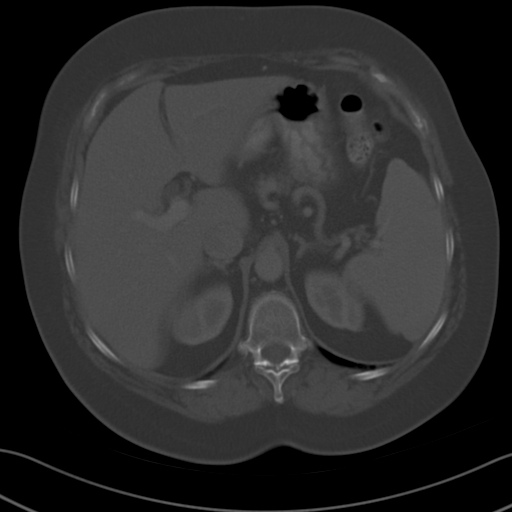
[im 23/32  soft-tissue]
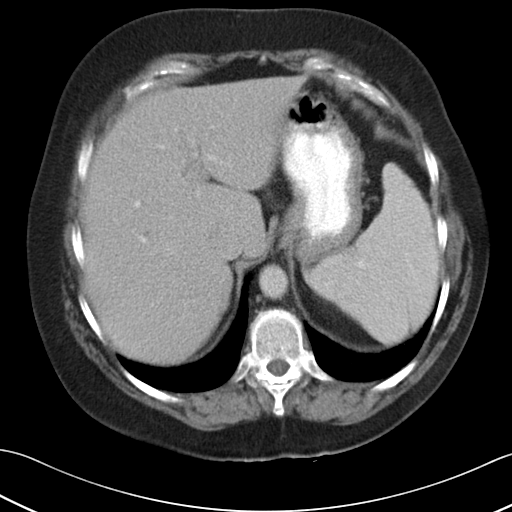
[im 24/32  soft-tissue]
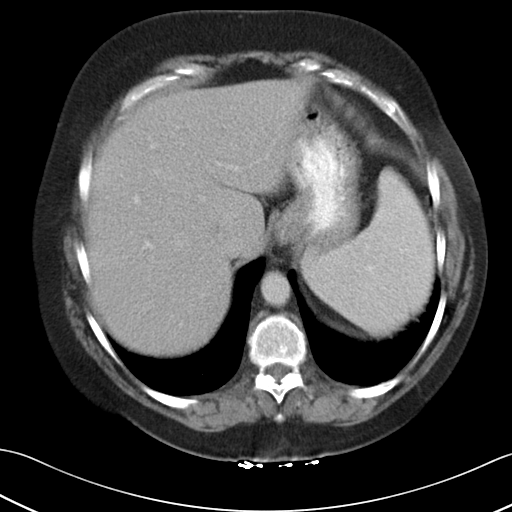
[im 26/32  lung]
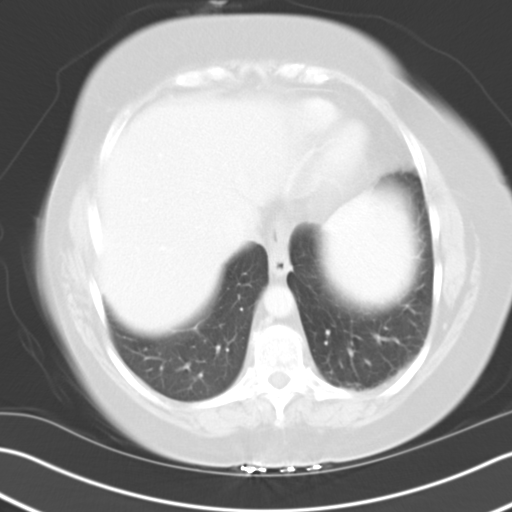
[im 27/32  soft-tissue]
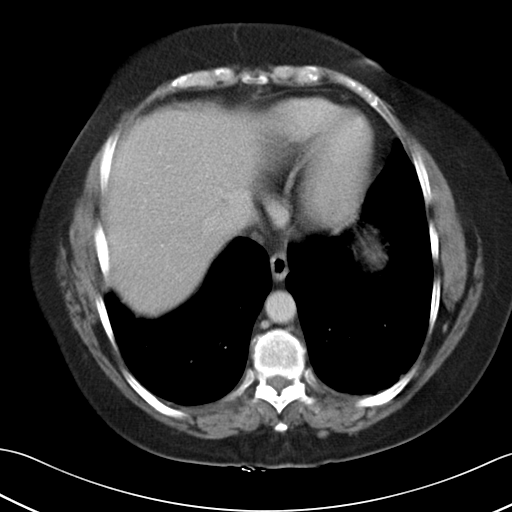
[im 27/32  lung]
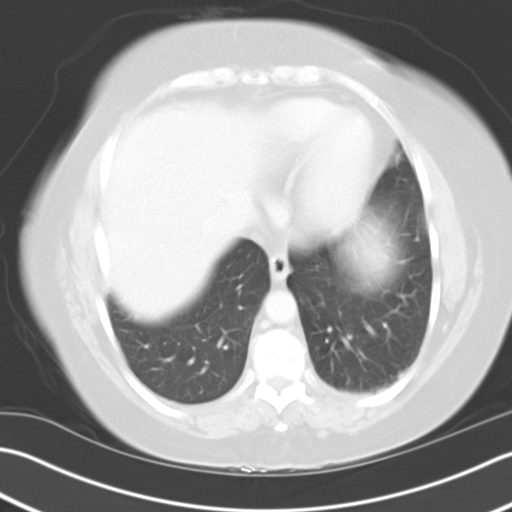
[im 29/32  lung]
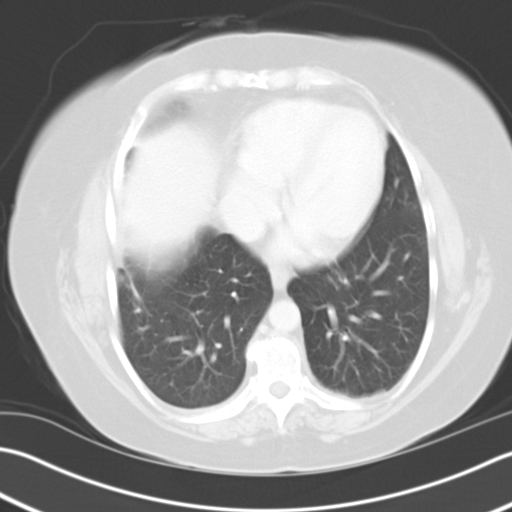
[im 30/32  soft-tissue]
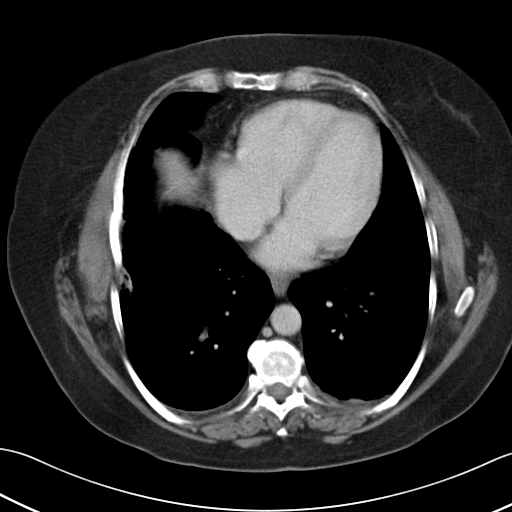
[im 30/32  lung]
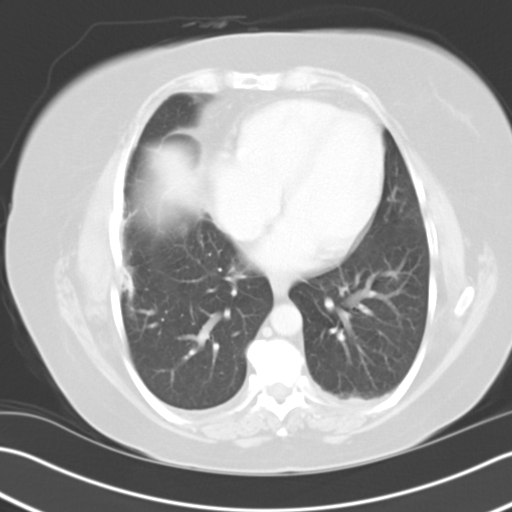

[15 of 32 positions shown; findings below may reference images not displayed]

PROCEDURE:     CT  - CT ABDOMEN STANDARD W  - February 05, 2008  [DATE]

RESULT:     The patient received 85 ml of [id number] for this study. The
patient has history of colonic malignancy with metastatic disease to the
lungs and is exhibiting increased liver enzymes currently.

The liver exhibits normal density with no focal mass or ductal dilation. The
gallbladder is surgically absent. There is mild dilation of the common bile
duct through the head of the pancreas consistent with the prior
cholecystectomy. The spleen exhibits no focal mass. The pancreas, partially
distended stomach, adrenal glands, and kidneys are normal in appearance. The
caliber of the abdominal aorta is normal. I see no periaortic or pericaval
lymphadenopathy. The observed portions of the small and large bowel exhibit
no acute abnormality. The study was not carried through the pelvis. The lung
bases exhibit no acute abnormality. The pleural effusion demonstrated on the
study 27 March, 2008 has resolved. The lumbar vertebral bodies are
preserved in height.
IMPRESSION: 1. I do not see evidence of intrinsic abnormality of the liver. The
gallbladder is surgically absent and mild normally expected dilation of the
common bile duct is seen.
2. I see no evidence of adrenal masses nor intraabdominal lymphadenopathy.
3. I see no acute abnormalities of the kidneys nor of the visualized
portions of the bowel.

## 2009-10-07 ENCOUNTER — Ambulatory Visit: Payer: Self-pay | Admitting: Surgery

## 2009-10-07 HISTORY — PX: BREAST EXCISIONAL BIOPSY: SUR124

## 2009-11-09 ENCOUNTER — Ambulatory Visit: Payer: Self-pay | Admitting: Oncology

## 2009-11-24 ENCOUNTER — Ambulatory Visit: Payer: Self-pay | Admitting: Oncology

## 2009-12-09 ENCOUNTER — Ambulatory Visit: Payer: Self-pay | Admitting: Oncology

## 2009-12-13 ENCOUNTER — Emergency Department: Payer: Self-pay | Admitting: Emergency Medicine

## 2009-12-13 ENCOUNTER — Encounter: Payer: Self-pay | Admitting: Cardiology

## 2009-12-14 ENCOUNTER — Encounter: Payer: Self-pay | Admitting: Cardiology

## 2009-12-19 ENCOUNTER — Encounter: Payer: Self-pay | Admitting: Cardiology

## 2010-01-20 ENCOUNTER — Emergency Department: Payer: Self-pay | Admitting: Emergency Medicine

## 2010-01-23 IMAGING — CT CT STONE STUDY
1 of 2 series · 15 of 32 positions shown, 19 images · non-contrast
Comparison: none

REASON FOR EXAM: blood in urine ,
COMMENTS:   LMP: Post Hysterectomy

[Series 2: stone · axial · 0.80mm/px · z∈[-451,-46]mm · 15 of 153 slices shown, 19 images]
[im 12/153  soft-tissue]
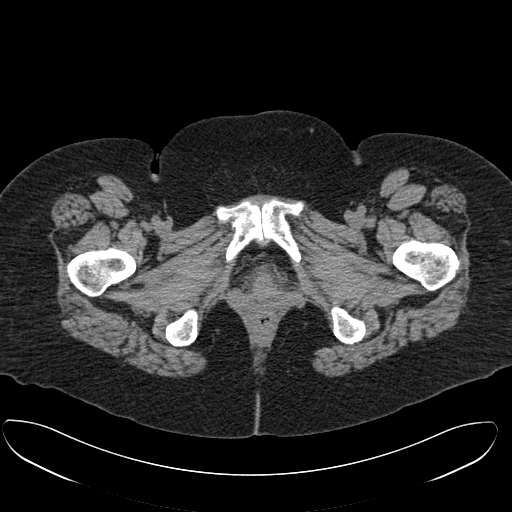
[im 12/153  bone]
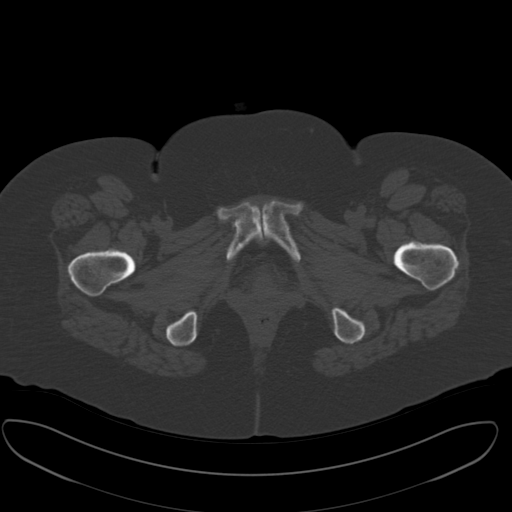
[im 23/153  soft-tissue]
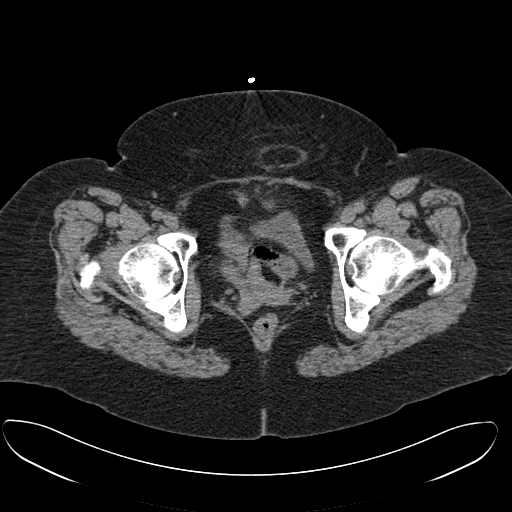
[im 34/153  soft-tissue]
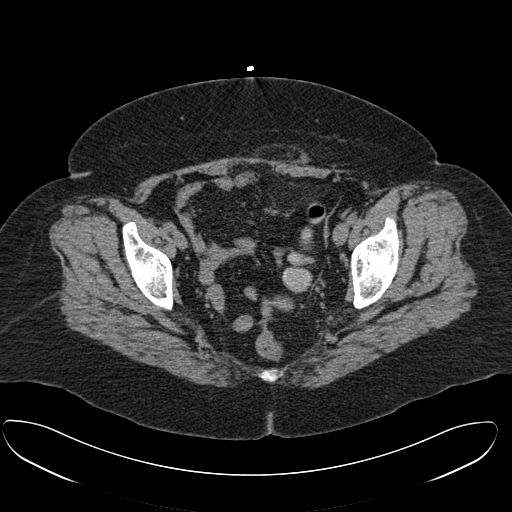
[im 46/153  soft-tissue]
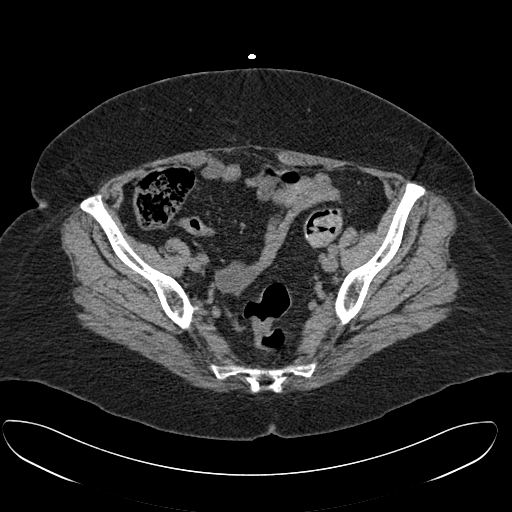
[im 57/153  soft-tissue]
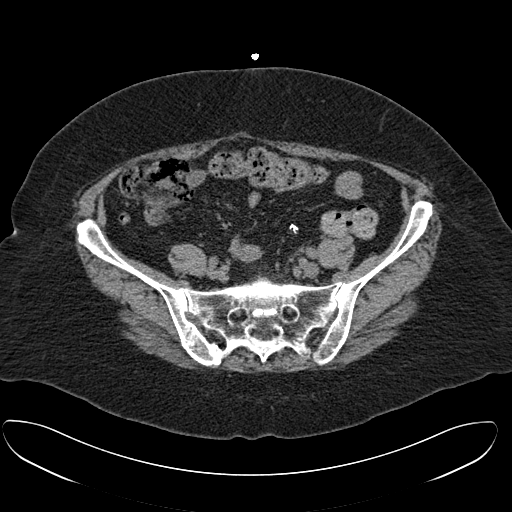
[im 68/153  soft-tissue]
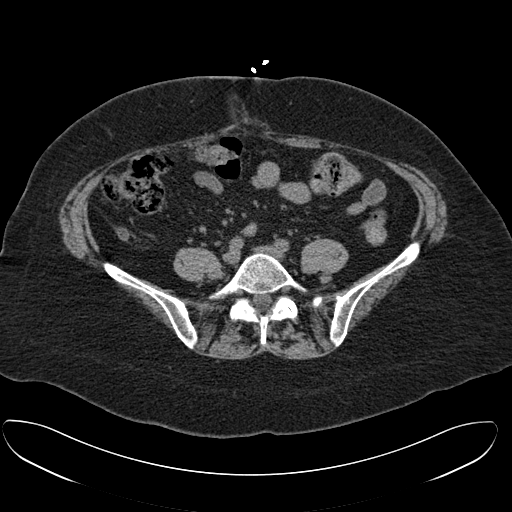
[im 79/153  soft-tissue]
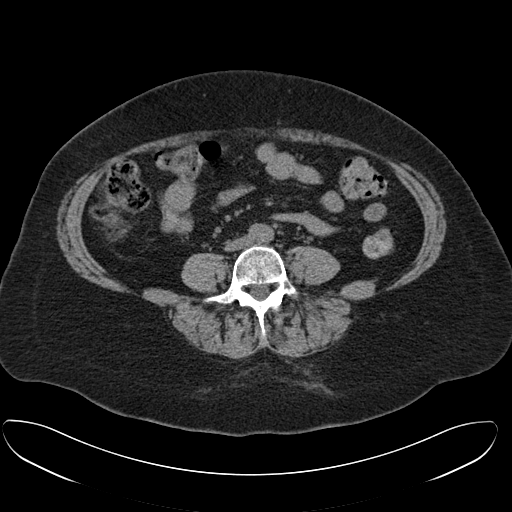
[im 91/153  soft-tissue]
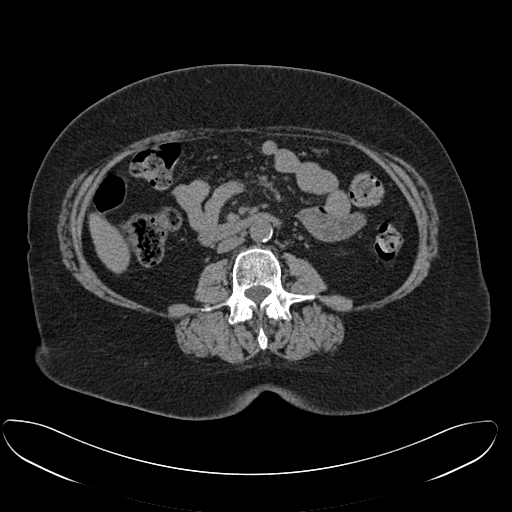
[im 102/153  soft-tissue]
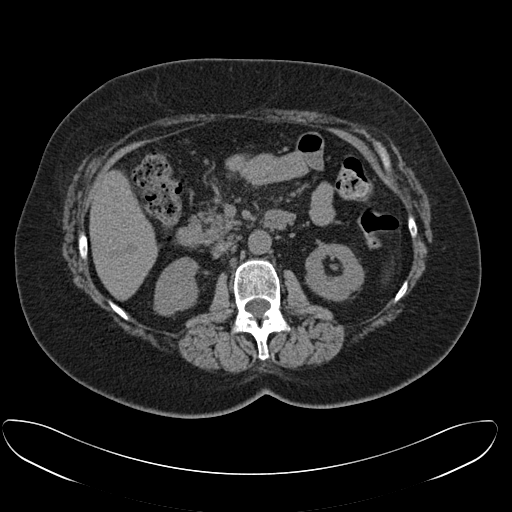
[im 102/153  bone]
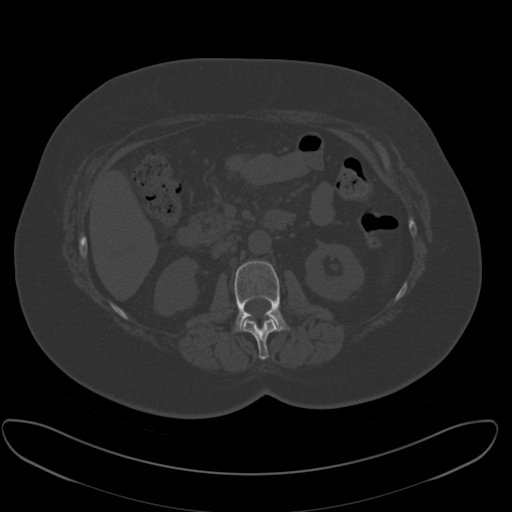
[im 113/153  soft-tissue]
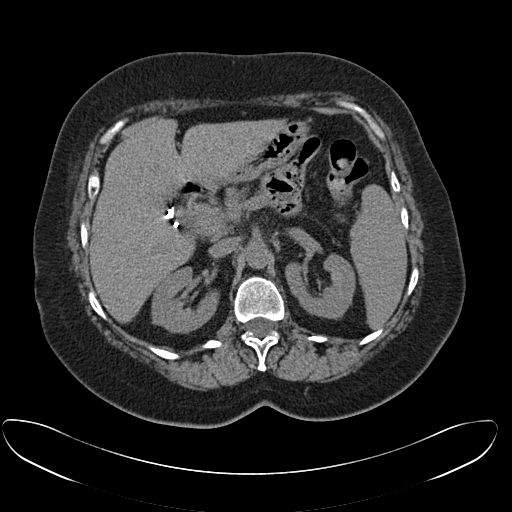
[im 124/153  soft-tissue]
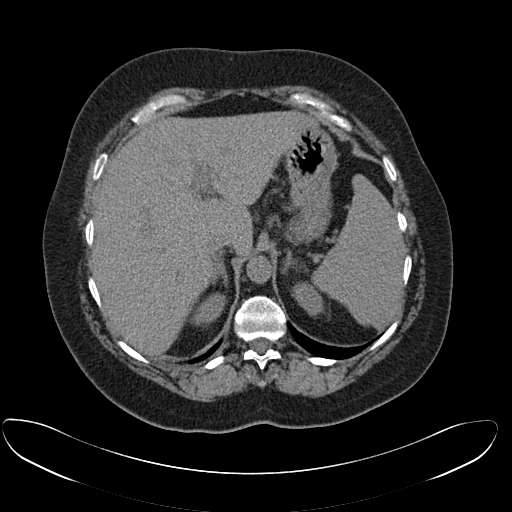
[im 130/153  lung]
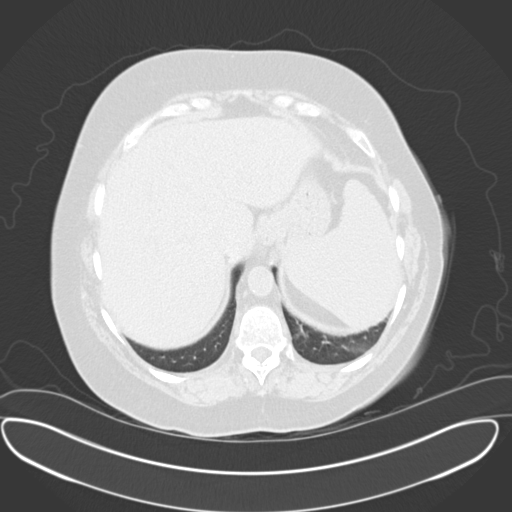
[im 136/153  soft-tissue]
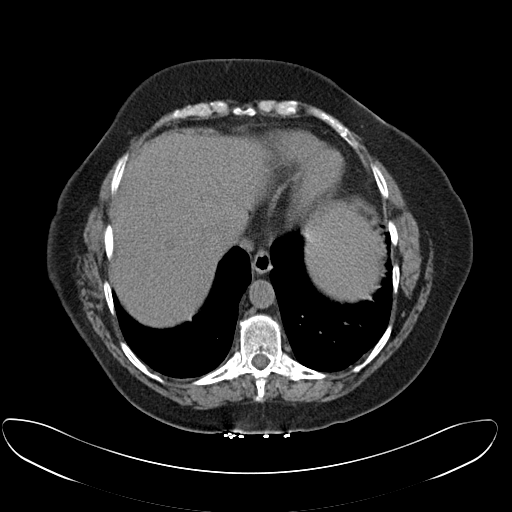
[im 136/153  lung]
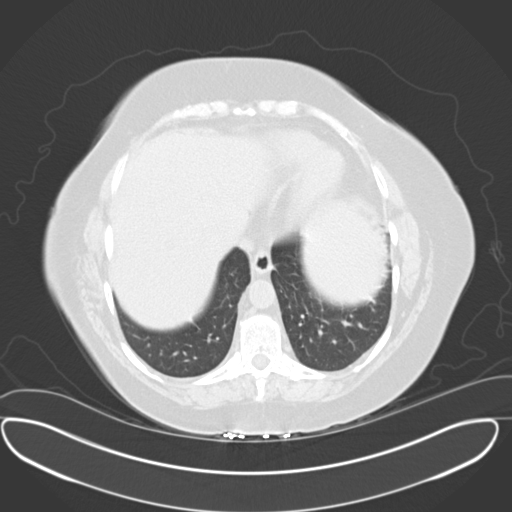
[im 141/153  lung]
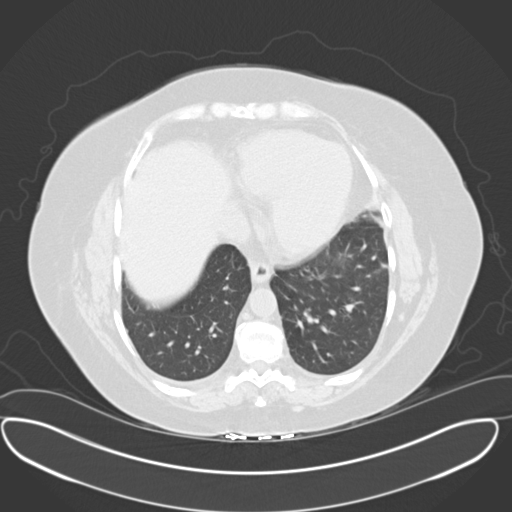
[im 147/153  soft-tissue]
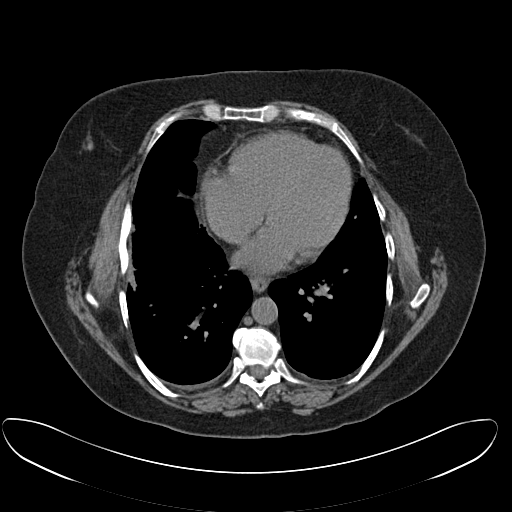
[im 147/153  lung]
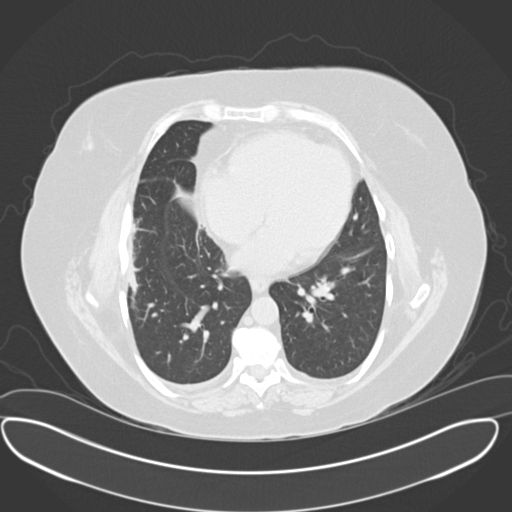

[15 of 32 positions shown; findings below may reference images not displayed]

PROCEDURE:     CT  - CT ABDOMEN /PELVIS WO (STONE)  - May 30, 2008 [DATE]

RESULT:     Noncontrast emergent CT of the abdomen and pelvis is performed
utilizing a renal stone protocol. Images are reconstructed in the axial
plane at 3 mm slice thickness and compared to previous examinations dated
03/28/2007 and 07/10/2006.

 There is a fat filled midline suprapubic hernia stable since the previous
exam. The urinary bladder is nondistended. There is no definite renal or
bladder mass. No urinary tract stones or obstructive changes are seen. The
appendix is normal. Cholecystectomy clips are present. Atherosclerotic
calcification is seen within the abdominal aorta. The lung bases demonstrate
normal aeration. There is a trace of pleural thickening or pleural fluid on
the right significantly decreased in appearance since the previous exam. The
liver, spleen, pancreas, adrenal glands and bowel appear to be unremarkable.
IMPRESSION: 1. No urinary tract stones.
2. No definite renal or bladder masses evident.
3. After sclerotic calcification. Previous cholecystectomy. Trace right
pleural effusion versus pleural thickening.
4. Suprapubic fat filled hernia in the midline.

## 2010-01-26 ENCOUNTER — Encounter: Payer: Self-pay | Admitting: Cardiology

## 2010-02-02 ENCOUNTER — Ambulatory Visit: Payer: Self-pay | Admitting: Cardiology

## 2010-02-02 DIAGNOSIS — R079 Chest pain, unspecified: Secondary | ICD-10-CM | POA: Insufficient documentation

## 2010-02-02 DIAGNOSIS — E7800 Pure hypercholesterolemia, unspecified: Secondary | ICD-10-CM

## 2010-02-02 DIAGNOSIS — R0602 Shortness of breath: Secondary | ICD-10-CM | POA: Insufficient documentation

## 2010-02-03 ENCOUNTER — Ambulatory Visit: Payer: Self-pay | Admitting: Cardiovascular Disease

## 2010-02-08 ENCOUNTER — Ambulatory Visit: Payer: Self-pay | Admitting: Cardiology

## 2010-02-10 ENCOUNTER — Encounter: Payer: Self-pay | Admitting: Cardiology

## 2010-02-14 ENCOUNTER — Ambulatory Visit: Payer: Self-pay

## 2010-03-02 ENCOUNTER — Encounter: Payer: Self-pay | Admitting: Cardiology

## 2010-03-02 ENCOUNTER — Ambulatory Visit: Payer: Self-pay | Admitting: Cardiology

## 2010-03-27 ENCOUNTER — Encounter: Payer: Self-pay | Admitting: Cardiology

## 2010-03-27 ENCOUNTER — Ambulatory Visit: Payer: Self-pay | Admitting: Oncology

## 2010-03-28 LAB — CEA: CEA: 0.6 ng/mL (ref 0.0–4.7)

## 2010-04-11 ENCOUNTER — Ambulatory Visit: Payer: Self-pay | Admitting: Oncology

## 2010-05-15 ENCOUNTER — Ambulatory Visit: Payer: Self-pay | Admitting: Cardiology

## 2010-05-22 ENCOUNTER — Ambulatory Visit: Payer: Self-pay | Admitting: Oncology

## 2010-05-22 LAB — CONVERTED CEMR LAB
Albumin: 4.1 g/dL (ref 3.5–5.2)
Indirect Bilirubin: 0.3 mg/dL (ref 0.0–0.9)
Total Bilirubin: 0.4 mg/dL (ref 0.3–1.2)
Total Protein: 6.9 g/dL (ref 6.0–8.3)

## 2010-05-24 ENCOUNTER — Ambulatory Visit: Payer: Self-pay | Admitting: Oncology

## 2010-07-12 NOTE — Letter (Signed)
Summary: [facility]   Imported By: Harlon Flor 02/27/2010 09:16:41  _____________________________________________________________________  External Attachment:    Type:   Image     Comment:   External Document

## 2010-07-12 NOTE — Letter (Signed)
Summary: [facility]   Imported By: Harlon Flor 02/02/2010 16:03:15  _____________________________________________________________________  External Attachment:    Type:   Image     Comment:   External Document

## 2010-07-12 NOTE — Letter (Signed)
Summary: [facility]   Imported By: Harlon Flor 01/30/2010 15:22:00  _____________________________________________________________________  External Attachment:    Type:   Image     Comment:   External Document

## 2010-07-12 NOTE — Assessment & Plan Note (Signed)
Summary: ROV/AMD  Medications Added MAXZIDE-25 37.5-25 MG TABS (TRIAMTERENE-HCTZ) one tablet once daily OMEPRAZOLE 20 MG TBEC (OMEPRAZOLE) one tablet once daily ALPRAZOLAM 0.5 MG TABS (ALPRAZOLAM) one tablet three times a day      Allergies Added: NKDA  Visit Type:  Follow-up Primary Provider:  Westley Hummer Scott,M.D.  CC:  c/o shortness of breath when walking a lot.Marland Kitchen  History of Present Illness: 60 yo with prior history of atypical chest pain, HTN, and hyperlipidemia presents for evaluation of episodes of shortness of breath.  Patient went to the ER in 7/11 with severe dyspnea.  This was thought to be a panic attack.  She gets frequent episodes where she feels like she can't breathe followed by tingling in the arms and legs.  She feels like she is hyperventilating during these episodes.  Episodes are triggered by events that provoke anxiety (going to the doctor, etc).  Sometimes an episode can be triggered by just going outside.  Xanax will help resolve these episodes.  She is not getting chest pain.  She says that she does have some exertional shortness of breath (after walking 100 yards or up a steep flight of stairs), but she is able to play volleyball with some running without much problem. She is not taking aspirin (had been on it in the past) due to some rectal bleeding (? hemorrhoids).  She had been on simvastatin in the past but this has been held due to elevated LFTs.   ECG: NSR, 1 mm anterior ST depression, 1/2 mm inferior ST depression  Preventive Screening-Counseling & Management  Alcohol-Tobacco     Smoking Status: never  Caffeine-Diet-Exercise     Does Patient Exercise: yes  Current Medications (verified): 1)  Maxzide-25 37.5-25 Mg Tabs (Triamterene-Hctz) .... One Tablet Once Daily 2)  Omeprazole 20 Mg Tbec (Omeprazole) .... One Tablet Once Daily 3)  Alprazolam 0.5 Mg Tabs (Alprazolam) .... One Tablet Three Times A Day  Allergies (verified): No Known Drug  Allergies  Past History:  Family History: Last updated: 02/02/2010 Family History of Coronary Artery Disease: Mother age 67 MI; deceased Family History of Cancer: Father   Social History: Last updated: 02/02/2010 Disabled  Widowed  Tobacco Use - No.  Alcohol Use - no Regular Exercise - yes--walks and plays volleyball.  Risk Factors: Exercise: yes (02/02/2010)  Risk Factors: Smoking Status: never (02/02/2010)  Past Medical History: 1. Colon cancer, 1999.  She had partial colectomy.  She did develop lung metastasis.  She had a right upper lobectomy in August 2008 and then she had associated chemotherapy.  2. Hypercholesterolemia. 3. Hypertension. 4. Depression. 5. Nephrolithiasis. 6. History of left heart catheterization in 2003, which per the patient's report was negative at [facility]. 7. Cholecystectomy in 2000. 8. Elevated transaminases.  ? NASH 9. GERD 10. Holter monitor (7/11): NSR, rare PACs and PVCs 11. Echo (2009): EF 65%, no significant valvular abnormalities.   Past Surgical History: tubligation--1980 colon cancer (removed part of large colon) gallbladder removed complete hysterectomy surg. on right lung (upper lobe right side removed) biopsy right breast 10/07/09  Family History: Reviewed history and no changes required. Family History of Coronary Artery Disease: Mother age 51 MI; deceased Family History of Cancer: Father   Social History: Reviewed history and no changes required. Disabled  Widowed  Tobacco Use - No.  Alcohol Use - no Regular Exercise - yes--walks and plays volleyball. Smoking Status:  never Does Patient Exercise:  yes  Review of Systems  All systems reviewed and negative except as per HPI.   Vital Signs:  Patient profile:   60 year old female Height:      64 inches Weight:      192 pounds BMI:     33.08 Pulse rate:   76 / minute BP sitting:   142 / 84  (left arm) Cuff size:   regular  Vitals  Entered By: Bishop Dublin, CMA (February 02, 2010 3:37 PM)  Physical Exam  General:  Well developed, well nourished, in no acute distress. Neck:  Neck supple, no JVD. No masses, thyromegaly or abnormal cervical nodes. Lungs:  Clear bilaterally to auscultation and percussion. Heart:  Non-displaced PMI, chest non-tender; regular rate and rhythm, S1, S2 without rubs or gallops. 1/6 systolic murmur along the sternal border.  Carotid upstroke normal, no bruit.  Pedals normal pulses. No edema, no varicosities. Abdomen:  Bowel sounds positive; abdomen soft and non-tender without masses, organomegaly, or hernias noted. No hepatosplenomegaly. Extremities:  No clubbing or cyanosis. Neurologic:  Alert and oriented x 3. Psych:  Normal affect.   Impression & Recommendations:  Problem # 1:  SHORTNESS OF BREATH (ICD-786.05) Patient has episodes of severe dyspnea relieved by Xanax.  These episodes do not tend to be related to exertion.  They do seem to be related to stress.  It is possible that these are panic attacks.  BNP was normal today.  She has separate, relatively mild, exertional dyspnea.  She does have an abnormal ECG with anterior and inferior ST depressions.  She has had an abnormal ECG in the past.  Risk factors include HTN and hyperlipidemia.  I will set her up for an ETT-myoview for risk stratification as well as an echocardiogram.  I am going to hold off on aspirin for now (unless myoview is positive) given recent rectal bleeding.  If cardiac workup turns out normal, would recommend starting a medication to treat panic/anxiety.  Patient was on Celexa in the past but did not tolerate it.   Problem # 2:  HYPERLIPIDEMIA-MIXED (ICD-272.4) Patient is off statin right now due to elevated LFTs. Will check lipids/LFTs.   Other Orders: EKG w/ Interpretation (93000) T-BNP  (B Natriuretic Peptide) (16109-60454) Echocardiogram (Echo) Nuclear Stress Test (Nuc Stress Test)  Patient Instructions: 1)  Your  physician recommends that you schedule a follow-up appointment in: 2 weeks.  2)  Your physician recommends that you return for a FASTING lipid profile: at your convience (lip/lft) 3)  Your physician recommends that you have lab work today: (BNP) 4)  Your physician recommends that you continue on your current medications as directed. Please refer to the Current Medication list given to you today. 5)  Your physician has requested that you have an exercise stress myoview.  For further information please visit [email or web address]  Please follow instruction sheet, as given. 6)  Your physician has requested that you have an echocardiogram.  Echocardiography is a painless test that uses sound waves to create images of your heart. It provides your doctor with information about the size and shape of your heart and how well your heart's chambers and valves are working.  This procedure takes approximately one hour. There are no restrictions for this procedure. [facility] 02/08/10 be [facility] the medical mall at 7:30

## 2010-07-12 NOTE — Assessment & Plan Note (Signed)
Summary: [facility]  Medications Added PRAVASTATIN SODIUM 40 MG TABS (PRAVASTATIN SODIUM) Take one tablet by mouth daily at bedtime      Allergies Added: NKDA  Visit Type:  Follow-up Primary Provider:  Charlene Scott,M.D.  CC:  c/o shortness of breath. Denies chest pain.Marland Kitchen  History of Present Illness: 60 yo with prior history of atypical chest pain, HTN, and hyperlipidemia presented for evaluation of episodes of shortness of breath.  Patient went to the ER in 7/11 with severe dyspnea.  This was thought to be a panic attack.  She gets frequent episodes where she feels like she can't breathe followed by tingling in the arms and legs.  She feels like she is hyperventilating during these episodes.  Episodes are triggered by events that provoke anxiety (going to the doctor, etc).  Sometimes an episode can be triggered by just going outside.  Xanax will help resolve these episodes.  She is not getting chest pain.  She says that she does have some exertional shortness of breath (after walking 100 yards or up a steep flight of stairs), but she is able to play volleyball with some running without much problem. She is not taking aspirin (had been on it in the past) due to some rectal bleeding (? hemorrhoids).  She had been on simvastatin in the past but this has been held due to elevated LFTs.   I had her do an ETT-myoview, which showed a small mid anterolateral fixed defect that was likely soft tissue attenuation.  No evidence for ischemia or infarction.  Echo showed preserved LV systolic function. She has been seeing Dr. Meredeth Ide with pulmonology.  He found moderate obstruction on PFTs.  She has a strong history of second hand smoke and may have a degree of COPD.  She reports occasional fluttering in her chest.  She had a holter monitor at [facility] this summer which showed some PACs and PVCs.   Labs (8/11): BNP 34, LDL 154, LFTs normal  Current Medications (verified): 1)  Maxzide-25 37.5-25 Mg Tabs  (Triamterene-Hctz) .... One Tablet Once Daily 2)  Omeprazole 20 Mg Tbec (Omeprazole) .... One Tablet Once Daily 3)  Alprazolam 0.5 Mg Tabs (Alprazolam) .... One Tablet Three Times A Day  Allergies (verified): No Known Drug Allergies  Past History:  Past Surgical History: Last updated: 02/02/2010 tubligation--1980 colon cancer (removed part of large colon) gallbladder removed complete hysterectomy surg. on right lung (upper lobe right side removed) biopsy right breast 10/07/09  Family History: Last updated: 02/02/2010 Family History of Coronary Artery Disease: Mother age 14 MI; deceased Family History of Cancer: Father   Social History: Last updated: 02/02/2010 Disabled  Widowed  Tobacco Use - No.  Alcohol Use - no Regular Exercise - yes--walks and plays volleyball.  Risk Factors: Exercise: yes (02/02/2010)  Risk Factors: Smoking Status: never (02/02/2010)  Past Medical History: 1. Colon cancer, 1999.  She had partial colectomy.  She did develop lung metastasis.  She had a right upper lobectomy in August 2008 and then she had associated chemotherapy.  2. Hypercholesterolemia. 3. Hypertension. 4. Depression. 5. Nephrolithiasis. 6. History of left heart catheterization in 2003, which per the patient's report was negative at [facility].  ETT-myoview (8/11) showed a small mid anterolateral fixed defect (likely attenuation) with no evidence for ischemia or infarction.  7. Cholecystectomy in 2000. 8. Elevated transaminases.  ? NASH 9. GERD 10. Holter monitor (7/11): NSR, rare PACs and PVCs 11. Echo (9/11): EF 55-60%, mild LAE, no regional  wall motion abnormalities.   Family History: Reviewed history from 02/02/2010 and no changes required. Family History of Coronary Artery Disease: Mother age 35 MI; deceased Family History of Cancer: Father   Social History: Reviewed history from 02/02/2010 and no changes required. Disabled  Widowed  Tobacco  Use - No.  Alcohol Use - no Regular Exercise - yes--walks and plays volleyball.  Review of Systems       All systems reviewed and negative except as per HPI.   Vital Signs:  Patient profile:   60 year old female Height:      64 inches Weight:      194 pounds BMI:     33.42 Pulse rate:   68 / minute BP sitting:   116 / 79  (left arm) Cuff size:   large  Vitals Entered By: Bishop Dublin, CMA (March 02, 2010 1:41 PM)  Physical Exam  General:  Well developed, well nourished, in no acute distress.  Obese.  Neck:  Neck supple, no JVD. No masses, thyromegaly or abnormal cervical nodes. Lungs:  Clear bilaterally to auscultation and percussion. Heart:  Non-displaced PMI, chest non-tender; regular rate and rhythm, S1, S2 without rubs or gallops. 1/6 systolic murmur along the sternal border.  Carotid upstroke normal, no bruit.  Pedals normal pulses. No edema, no varicosities. Abdomen:  Bowel sounds positive; abdomen soft and non-tender without masses, organomegaly, or hernias noted. No hepatosplenomegaly. Extremities:  No clubbing or cyanosis. Neurologic:  Alert and oriented x 3. Psych:  Normal affect.   Impression & Recommendations:  Problem # 1:  SHORTNESS OF BREATH (ICD-786.05) Patient had a myoview showing no ischemia or infarction and an echo with preserved LV systolic function.  Patient has episodes of severe dyspnea relieved by Xanax.  These episodes do not tend to be related to exertion.  They do seem to be related to stress.  I suspect that these may be panic/anxiety attacks.   She has separate, relatively mild, exertional dyspnea that may be related to weight and a degree of obstructive lung disease.  I will hold off on aspirin given no evidence for coronary disease and her bleeding history.   Problem # 2:  HYPERLIPIDEMIA-MIXED (ICD-272.4) LFTs have normalized.  LDL is high at 154.  I will have her try a low dose of pravastatin (40 mg daily).  I will get LFTs in 1 month then  lipids/LFTs in 2 months.   Patient Instructions: 1)  Your physician recommends that you return for a FASTING lipid profile: LFT in 1 month LIPID/LFT 2 months  2)  Your physician has recommended you make the following change in your medication: START pravastatin  3)  Your physician wants you to follow-up in:  6 months  You will receive a reminder letter in the mail two months in advance. If you don't receive a letter, please call our office to schedule the follow-up appointment. Prescriptions: PRAVASTATIN SODIUM 40 MG TABS (PRAVASTATIN SODIUM) Take one tablet by mouth daily at bedtime  #30 x 6   Entered by:   Benedict Needy, RN   Authorized by:   Marca Ancona, MD   Signed by:   Benedict Needy, RN on 03/02/2010   Method used:   Electronically to        [facility] (retail)       [address]       [place], [place]  [address]  Ph: [phone]       Fax: [phone]   RxID:   [id number]

## 2010-09-26 ENCOUNTER — Ambulatory Visit: Payer: Self-pay | Admitting: Oncology

## 2010-09-27 LAB — CEA: CEA: 0.7 ng/mL (ref 0.0–4.7)

## 2010-10-04 ENCOUNTER — Ambulatory Visit: Payer: Self-pay | Admitting: Surgery

## 2010-10-10 ENCOUNTER — Ambulatory Visit: Payer: Self-pay | Admitting: Oncology

## 2010-10-24 NOTE — Assessment & Plan Note (Signed)
[facility] OFFICE NOTE   LAKE, BREEDING                       MRN:          [id number]  DATE:03/09/2008                            DOB:          08-20-1950    PRIMARY CARE PHYSICIAN:  Dr. Dale Falmouth with the [facility].   HISTORY OF PRESENT ILLNESS:  This is a 60 year old with history of  metastatic colon cancer status post partial colectomy and right upper  lobectomy for resection of metastatic colon cancer, who presents to  Cardiology Clinic for evaluation of hypertension, abnormal EKG, chest  pain, chest tightness, and shortness of breath with exertion.  The  patient's most recent operation for colon cancer was back in August  2008, when she had a right upper lobectomy of metastatic colon cancer.  She has been doing quite well since that time.  Her activity level has  increased.  She states that she can walk up to 2.5 miles at a time.  However, she has noticed that when she walks up a hill, she will get  both short of breath and also some substernal tightness about a 5/10.  There is no radiation.  This tightness resolves when she stops to rest.  She only gets the chest tightness with moderate-to-heavy exertion, does  not get it with moderate exertion or at rest.  She does not get short of  breath or chest pain when she walks on flat ground.  She does get  somewhat short of breath climbing up a flight of steps.  She is able to  do her job.  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  She has no leg pain or  tightness with exertion.  She also has been taking her blood pressure at  home and has noticed that her blood pressure at home has gotten as high  as the 160-170 systolic with blood pressure actually 126/86 here today.  She had been on triamterene/HCTZ in the past, but she is not actually  taking it.  She is somewhat worried,  however, as she does have a family  history of early coronary artery disease and she has had a history of  both hypertension and hypercholesterolemia.  Her LDL cholesterol is  elevated at 142; however, she has been told not to take statin  cholesterol medications because she has had some elevation in her  transaminases after chemotherapy.  Most recent labs available to me are  from July 2009 with an AST of 46 and ALT of 39.   PAST MEDICAL HISTORY:  1. The patient has a history of colon cancer in 1999.  She had partial      colectomy.  She did develop lung metastasis.  She had a right upper      lobectomy in August 2008 and then she had associated chemotherapy.      Her PET scan did light up in the anal area earlier this year;      however, followup colonoscopy was okay.  2.  Hypercholesterolemia.  3. Hypertension.  4. Depression.  5. Nephrolithiasis.  6. History of left heart catheterization in 2003, which per the      patient's report was negative at [facility].  7. Cholecystectomy in 2000.  8. Elevated transaminases.  The patient did have a CT scan of the      abdomen this summer that showed the liver looked okay.  The      transaminitis is thought to be possibly due to chemotherapy.  Her      most recent labs were in July 2009.  AST was 46 and ALT was 39.   MEDICATIONS:  The patient takes;  1. Celexa 20 mg daily.  2. Prilosec 20 mg daily.   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  Lives  in [place].  She is separated from her husband, has 3 kids, and  lives with her daughter.  She does not smoke.  She does not use any  alcohol.  Does not use any illicit drugs.   FAMILY HISTORY:  The patient's mother had an MI at the age of 64, her  brother had an MI at the age of 24, her father had a stroke and had lung  cancer.   EKG today shows normal sinus rhythm.  There are T-wave inversions both  inferiorly and anterolaterally.    REVIEW OF SYSTEMS:  Negative except as noted in the history of present  illness.  Most recent labs that are available from July 2009; potassium  3.9, BUN 14, creatinine 1.0, glucose 109, AST 46, ALT 39, total  cholesterol 221, HDL 58.4, and LDL 142.   PHYSICAL EXAMINATION:  VITAL SIGNS:  Blood pressure is 126/86; however,  according to her home records, it has been running as high as the 150s-  170s, it looks like towards the evening, heart rate 66 and regular,  weight is 206 pounds.  GENERAL:  This is a mildly obese female in no apparent distress.  NEUROLOGIC:  Alert and oriented x3.  Normal affect.  NECK:  There is no thyromegaly, no thyroid nodule.  There is no JVD.  There is no carotid bruit.  LUNGS:  Clear to auscultation bilaterally with normal respiratory  excursion.  HEART:  Regular S1 and S2.  No S3, no S4.  There is a 1/6 systolic  murmur along the left lower sternal border and towards the apex.  ABDOMEN:  Soft and nontender.  No hepatosplenomegaly.  Normal bowel  sounds.  EXTREMITIES:  There is no peripheral edema.  There are 2+ dorsalis pedis  pulses bilaterally.  HEENT:  Normal.  SKIN:  Normal.  MUSCULOSKELETAL:  Normal.   ASSESSMENT AND PLAN:  This is a 60 year old with history of  hypercholesterolemia, hypertension, and metastatic colon cancer, who  presents with some chest tightness, and shortness of breath with  exertion as well as an abnormal EKG.  1. Shortness of breath and chest tightness with exertion.  This does      sound anginal in nature.  The patient does have an abnormal EKG      with T-wave inversions inferiorly and anterolaterally.  The chest      pain is exertional.  It is brought on by moderate-to-heavy      exertion.  She is not having chest pain at rest and she is not      having chest pain with mild exertion.  She does report negative  heart catheterization in 2003.  I think, at this time, in order to      risk stratify her, I am going to set  her up to get an exercise      treadmill Myoview.  If there is a significant amount of ischemic      myocardium at risk, I think it would be reasonable to do heart      catheterization to see if there is an interventional option.      However, if there is only a small area of ischemia, given her      minimal symptoms, then I think it might be reasonable to medically      manage her for now.  I have told her that she does need to start      taking aspirin 81 mg a day, which she will begin today.  2. Hypertension.  The patient's blood pressure has been running high      at home.  She has a history of hypertension.  She is supposed to be      on triamterene/HCTZ, but is not taking it.  I will give her a      prescription for triamterene/HCTZ 37.5/25, which she should be      taking a tab daily.  We will bring her back in 2 weeks for blood      pressure check.  3. Hypercholesterolemia.  The patient's LDL is elevated at 142.  We      would certainly like to see her LDL lower than this.  She has very      mild elevation in her transaminases, which is likely secondary to      her past chemotherapy perhaps.  Her last AST was 46 and ALT 39.  I      think it would be reasonable to try on the low-dose statin.  I      think simvastatin 10 mg a day should be tolerable.  We will begin      at this dose and prior to starting we will check her transaminases,      and if they are significantly higher than they were July, we may      hold off on the statin.  4. History of leaky valves.  The patient does have history of being      told that she has leaky valves.  I do hear a slight systolic      murmur.  It sounds like it could be tricuspid or mitral      regurgitation.  I am going to go ahead and obtain an echocardiogram      to look for any significant valvular lesions.     Marca Ancona, MD  Electronically Signed    DM/MedQ  DD: 03/09/2008  DT: 03/10/2008  Job #: [id number]   cc:   Dale [place]

## 2010-10-27 ENCOUNTER — Emergency Department: Payer: Self-pay | Admitting: Emergency Medicine

## 2010-11-17 ENCOUNTER — Encounter: Payer: Self-pay | Admitting: Cardiology

## 2010-11-23 ENCOUNTER — Encounter: Payer: Self-pay | Admitting: Cardiology

## 2010-11-24 ENCOUNTER — Ambulatory Visit: Payer: Self-pay | Admitting: Cardiology

## 2010-11-24 ENCOUNTER — Ambulatory Visit (INDEPENDENT_AMBULATORY_CARE_PROVIDER_SITE_OTHER): Payer: Medicare PPO | Admitting: Cardiology

## 2010-11-24 ENCOUNTER — Encounter: Payer: Self-pay | Admitting: Cardiology

## 2010-11-24 DIAGNOSIS — E785 Hyperlipidemia, unspecified: Secondary | ICD-10-CM

## 2010-11-24 DIAGNOSIS — Z789 Other specified health status: Secondary | ICD-10-CM

## 2010-11-24 DIAGNOSIS — R9431 Abnormal electrocardiogram [ECG] [EKG]: Secondary | ICD-10-CM

## 2010-11-24 DIAGNOSIS — R0602 Shortness of breath: Secondary | ICD-10-CM

## 2010-11-24 DIAGNOSIS — R0989 Other specified symptoms and signs involving the circulatory and respiratory systems: Secondary | ICD-10-CM

## 2010-11-24 DIAGNOSIS — Z9189 Other specified personal risk factors, not elsewhere classified: Secondary | ICD-10-CM

## 2010-11-24 MED ORDER — ASPIRIN 81 MG PO TABS: 81.0000 mg | ORAL_TABLET | Freq: Every day | ORAL | Status: AC

## 2010-11-26 DIAGNOSIS — Z9189 Other specified personal risk factors, not elsewhere classified: Secondary | ICD-10-CM | POA: Insufficient documentation

## 2010-11-26 NOTE — Assessment & Plan Note (Signed)
Strong family history of premature CAD.  Patient has an abnormal ECG but this appears to be her baseline (no change).  She should take ASA 81 mg daily and continue statin.  Myoview in 2011 showed no ischemia.  She can undergo colonoscopy without any further workup needed.

## 2010-11-26 NOTE — Assessment & Plan Note (Signed)
Patient had recent lipids at Dr. Roby Lofts office.  We will call for a copy.  Given her family history, would like to see LDL < 100.

## 2010-11-26 NOTE — Progress Notes (Signed)
PCP: Dr. Dale Osnabrock  60 yo with prior history of atypical chest pain, HTN, and hyperlipidemia presented for cardiology followup.  She has had episodes of shortness of breath periodically that seem to be triggered by anxiety.  I had her do an ETT-myoview in 8/11, which showed a small mid anterolateral fixed defect that was likely soft tissue attenuation.  No evidence for ischemia or infarction.  Echo showed preserved LV systolic function. She has been seeing Dr. Meredeth Ide with pulmonology.  He found moderate obstruction on PFTs.  She has a strong history of second hand smoke and may have a degree of COPD.  No chest pain. She has been very concerned about her coronary disease risk as her brother had an MI at 59.  She needs a colonoscopy and was noted to have an abnormal ECG so she needs cardiology clearance prior to the procedure.   ECG: NSR, anterior and inferior < 1 mm ST depression (no change from 8/11 ECG)  Labs (8/11): BNP 34, LDL 154, LFTs normal Labs (12/11): LFTs normal  Allergies (verified):  No Known Drug Allergies  ROS: All systems reviewed and negative except as per HPI.   Family History: Family History of Coronary Artery Disease: Mother age 28 MI; deceased Family History of Cancer: Father   Social History: Disabled  Widowed  Tobacco Use - No.  Alcohol Use - no Regular Exercise - yes--walks and plays volleyball.  Past Medical History: 1. Colon cancer, 1999.  She had partial colectomy.  She did develop lung metastasis.  She had a right upper lobectomy in August 2008 and then she had associated chemotherapy.  2. Hypercholesterolemia. 3. Hypertension. 4. Depression. 5. Nephrolithiasis. 6. History of left heart catheterization in 2003, which per the patient's report was negative at [facility].  ETT-myoview (8/11) showed a small mid anterolateral fixed defect (likely attenuation) with no evidence for ischemia or infarction.  ECG has been abnormal with  anterior and inferior < 1 mm ST depressions.  7. Cholecystectomy in 2000. 8. Elevated transaminases.  ? NASH 9. GERD 10. Holter monitor (7/11): NSR, rare PACs and PVCs  Current Outpatient Prescriptions  Medication Sig Dispense Refill  . ALPRAZolam (XANAX) 0.5 MG tablet Take 0.5 mg by mouth 3 (three) times daily.        . Cholecalciferol (VITAMIN D3) 400 UNITS CAPS Take by mouth. Take two tablets daily       . Multiple Vitamin (MULTIVITAMIN) tablet Take 1 tablet by mouth daily.        Marland Kitchen omeprazole (PRILOSEC OTC) 20 MG tablet Take 20 mg by mouth daily.        . potassium chloride SA (K-DUR,KLOR-CON) 20 MEQ tablet Take 20 mEq by mouth daily.        . pravastatin (PRAVACHOL) 40 MG tablet Take 40 mg by mouth at bedtime.        . triamterene-hydrochlorothiazide (MAXZIDE-25) 37.5-25 MG per tablet Take 1 tablet by mouth daily.        Marland Kitchen aspirin 81 MG tablet Take 1 tablet (81 mg total) by mouth daily.  30 tablet  0    BP 120/84  Pulse 73  Ht 5\' 4"  (1.626 m)  Wt 183 lb (83.008 kg)  BMI 31.41 kg/m2 General:  Well developed, well nourished, in no acute distress.  Obese.  Neck:  Neck supple, no JVD. No masses, thyromegaly or abnormal cervical nodes. Lungs:  Clear bilaterally to auscultation and percussion. Heart:  Non-displaced PMI, chest non-tender; regular rate  and rhythm, S1, S2 without rubs or gallops. 1/6 systolic murmur along the sternal border.  Carotid upstroke normal, no bruit.  Pedals normal pulses. No edema, no varicosities. Abdomen:  Bowel sounds positive; abdomen soft and non-tender without masses, organomegaly, or hernias noted. No hepatosplenomegaly. Extremities:  No clubbing or cyanosis. Neurologic:  Alert and oriented x 3. Psych:  Normal affect. 11. Echo (9/11): EF 55-60%, mild LAE, no regional wall motion abnormalities.

## 2010-11-26 NOTE — Assessment & Plan Note (Signed)
Patient still has periodic episodes of dyspnea associated with anxiety.  No prominent exertional symptoms.  I think that these episodes are noncardiac.

## 2010-11-27 ENCOUNTER — Telehealth: Payer: Self-pay | Admitting: *Deleted

## 2010-11-27 NOTE — Telephone Encounter (Signed)
Cardiac clearance faxed to [facility] for colonoscopy. Last office note sent stating pt is cleared.

## 2010-12-21 ENCOUNTER — Ambulatory Visit: Payer: Self-pay | Admitting: Oncology

## 2010-12-26 ENCOUNTER — Ambulatory Visit: Payer: Self-pay | Admitting: Oncology

## 2011-01-10 ENCOUNTER — Ambulatory Visit: Payer: Self-pay | Admitting: Oncology

## 2011-01-18 IMAGING — US ULTRASOUND RIGHT BREAST
1 series · 17 of 25 positions shown · non-contrast
Comparison: none

REASON FOR EXAM: Rt Parenchymal dens
COMMENTS:

[Series 1: ultrasound right breast · 17 of 31 slices shown]
[im 1/31]
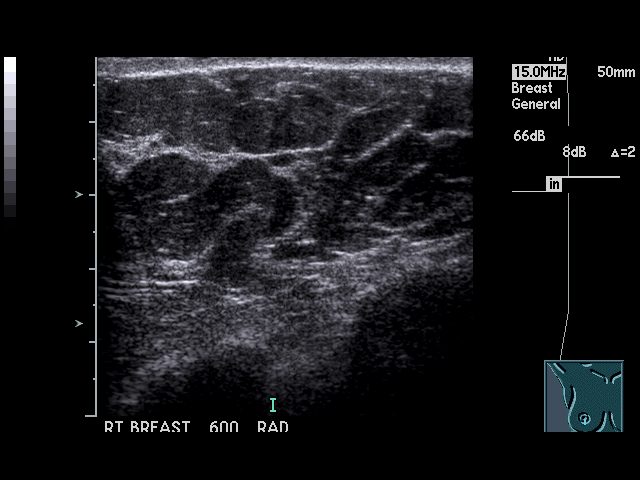
[im 3/31]
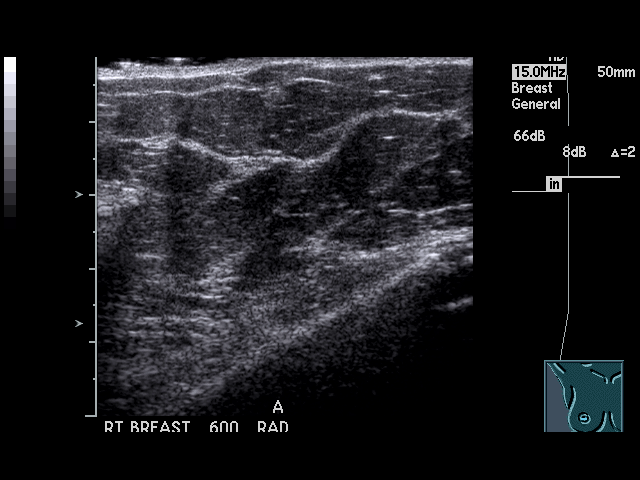
[im 4/31]
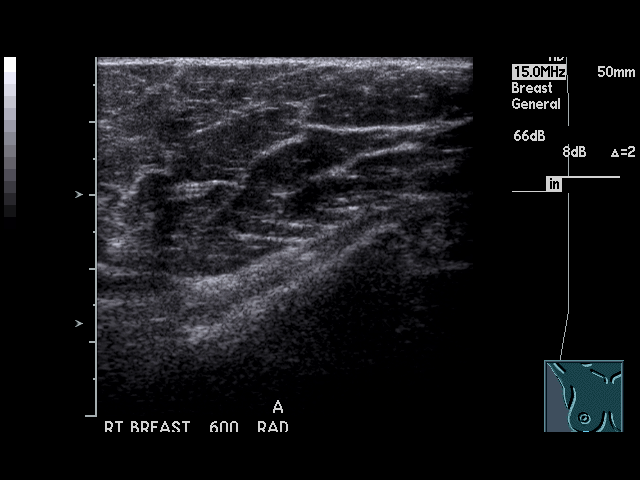
[im 7/31]
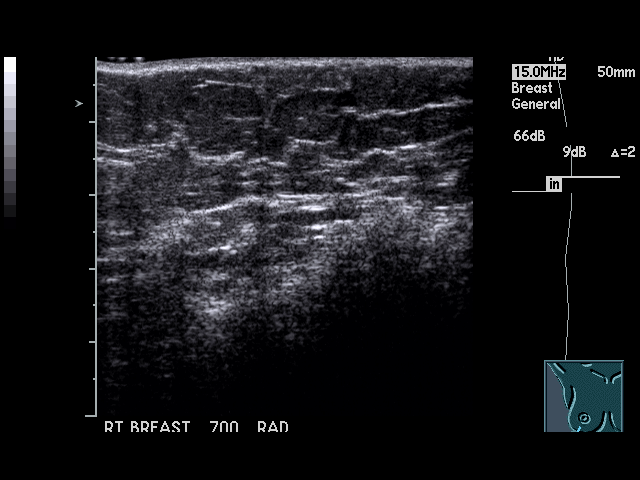
[im 8/31]
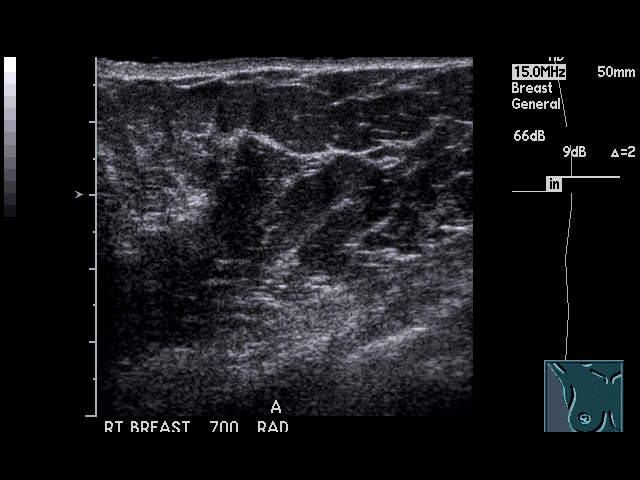
[im 11/31]
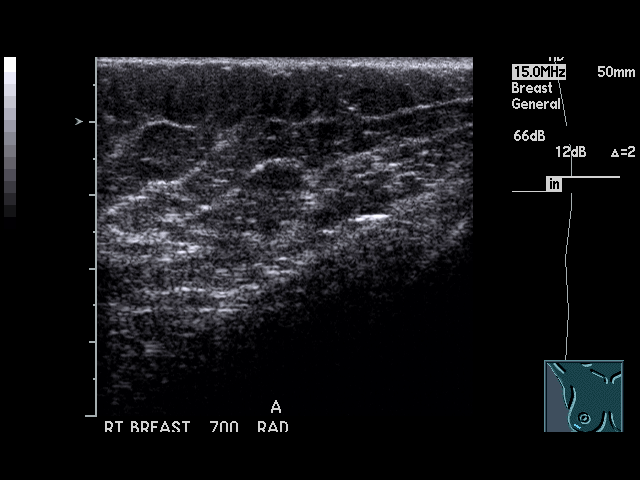
[im 12/31]
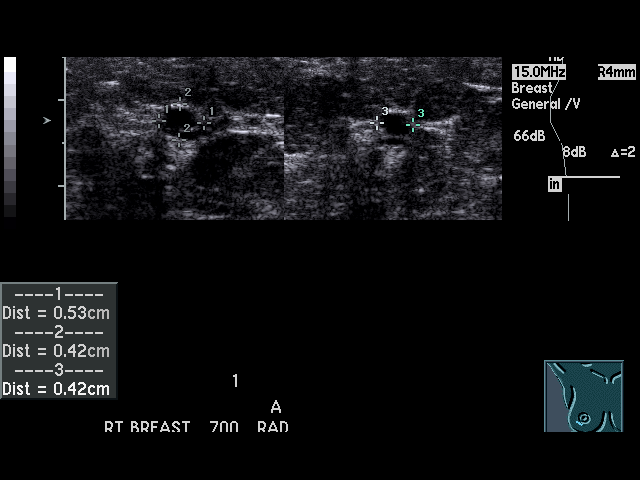
[im 14/31]
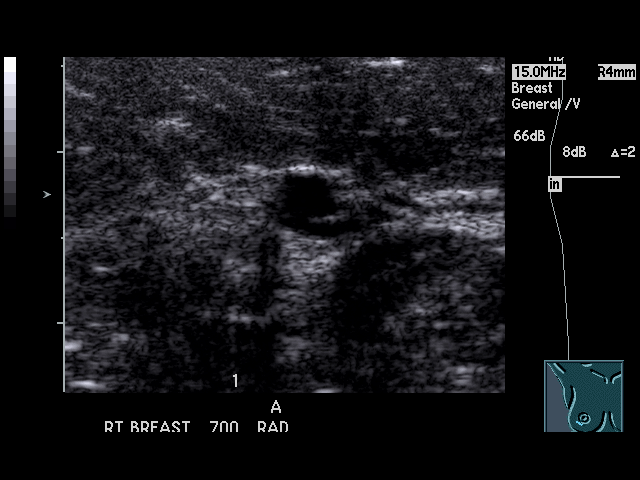
[im 16/31]
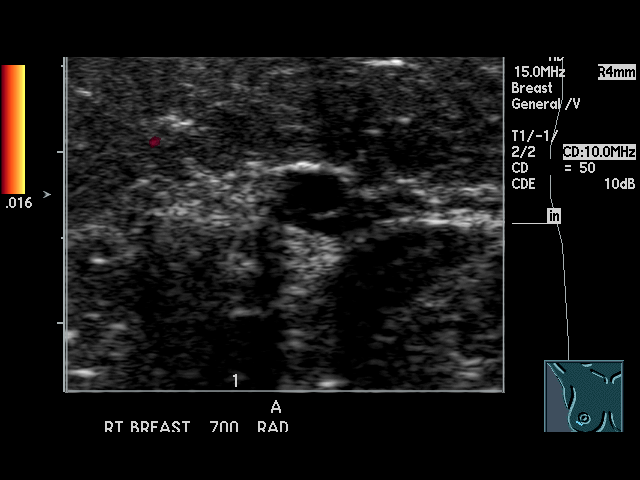
[im 17/31]
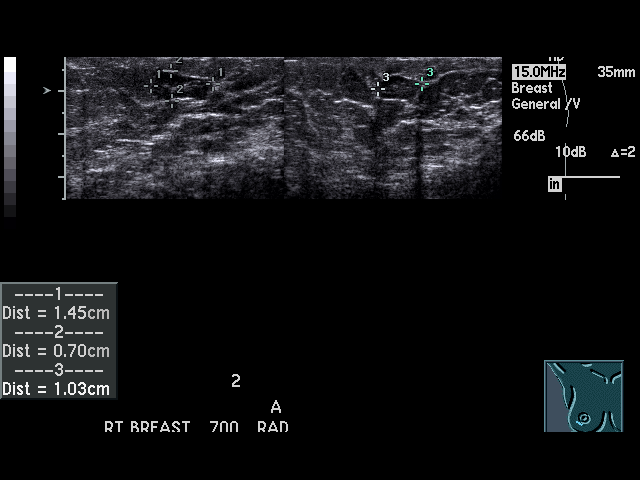
[im 19/31]
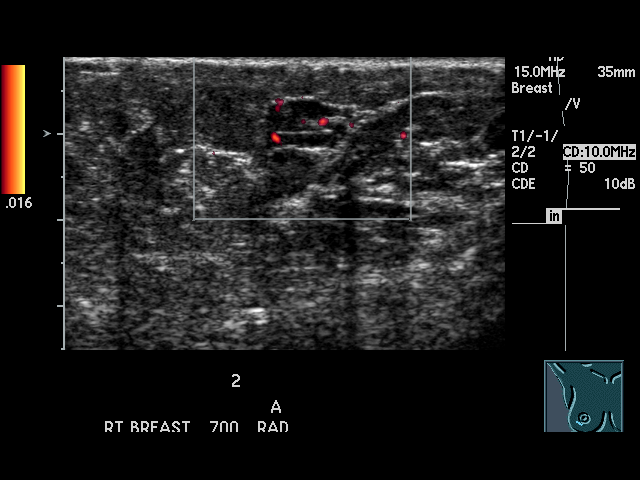
[im 21/31]
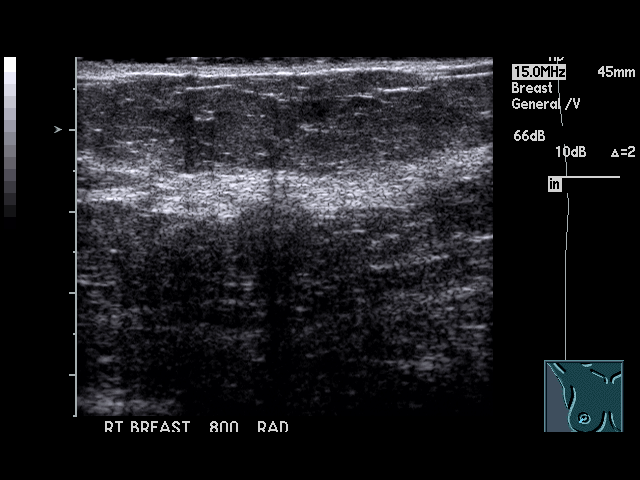
[im 23/31]
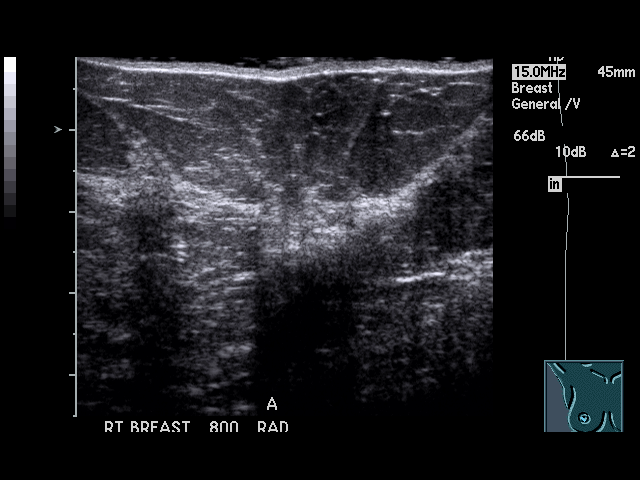
[im 24/31]
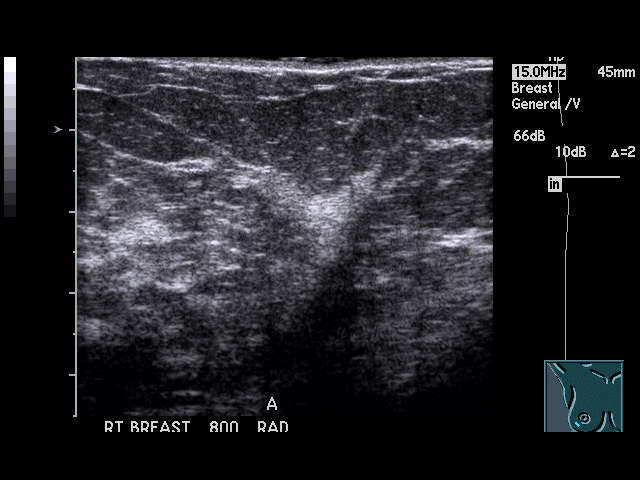
[im 27/31]
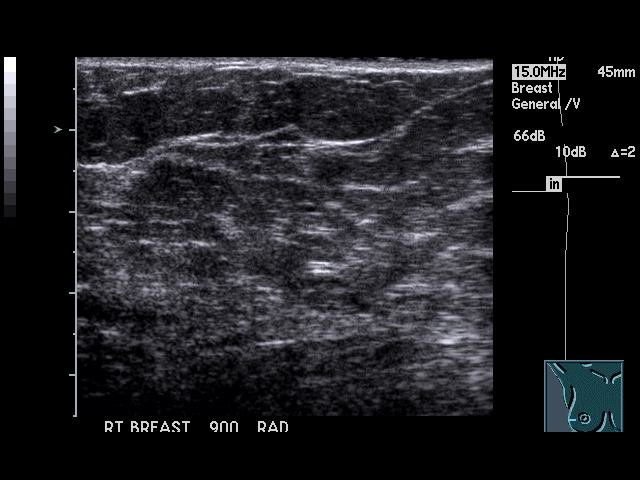
[im 28/31]
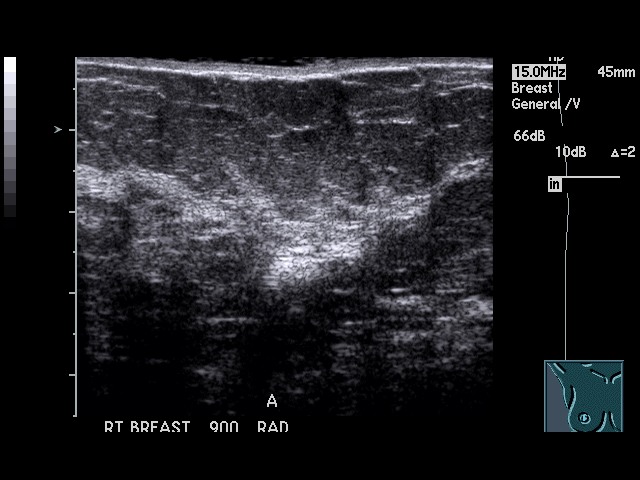
[im 31/31]
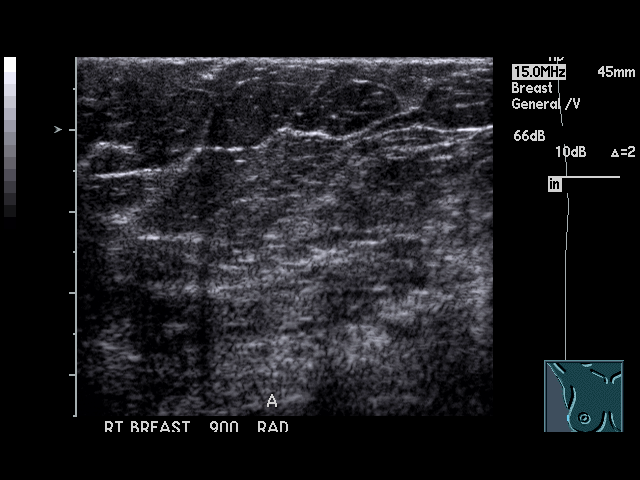

[17 of 25 positions shown; findings below may reference images not displayed]

PROCEDURE:     US  - US BREAST RIGHT  - May 25, 2009 [DATE]

RESULT:       Ultrasound of the inferolateral aspect of the right breast
demonstrates two areas of sonographic abnormality with one being somewhat
complex and hypoechoic with septations measuring 5.3 x 4.2 x 4.2 mm at the 7
o'clock position and another appearing more mixed echogenicity measuring
1.45 x 0.70 x 1.03 cm also at the 7 o'clock position.  An intramammary lymph
node could not be completely excluded in this region but this is not
definite sonographically.  Additional ultrasound of the area of the upper
portion of the breast is suggested to better evaluate areas seen on the ML
projection and on the initial screening MLO projection. This was not
requested at the time of the additional views but would help clarify the
findings.
IMPRESSION: 1.     BI-RADS:  Category 0- Needs Additional Imaging.
2.     Please have the patient return for additional ultrasound of the upper
half of the right breast.   We apologize for this added inconvenience.

## 2011-01-20 IMAGING — US ULTRASOUND RIGHT BREAST
1 series · 17 of 25 positions shown · non-contrast
Comparison: none

REASON FOR EXAM: rt density
COMMENTS:

[Series 1: ultrasound right breast · 17 of 36 slices shown]
[im 1/36]
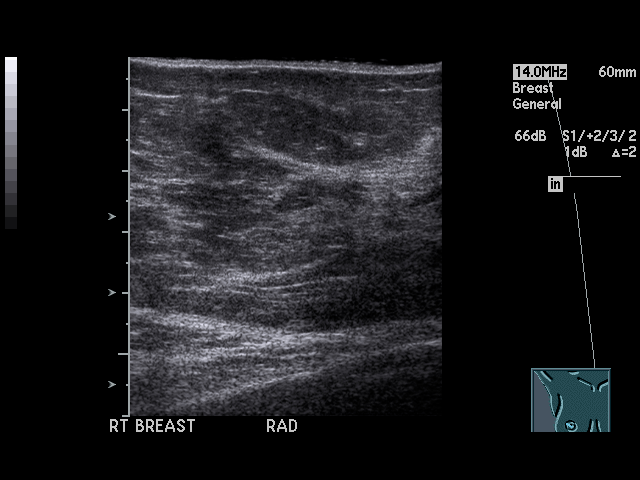
[im 3/36]
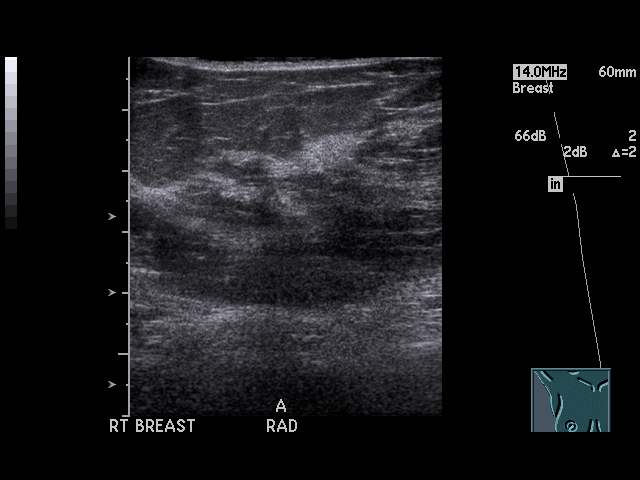
[im 5/36]
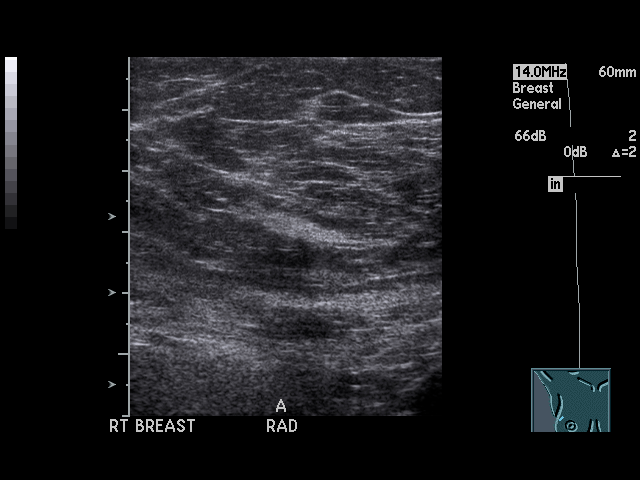
[im 8/36]
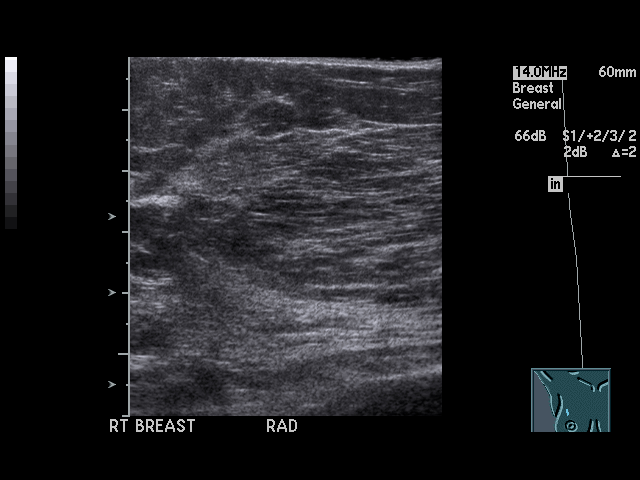
[im 9/36]
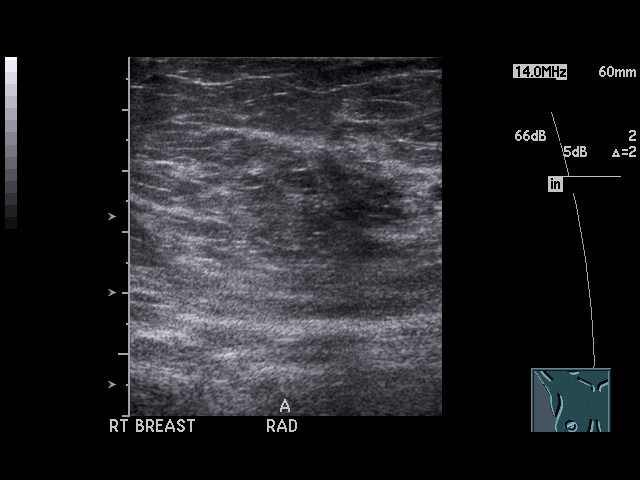
[im 12/36]
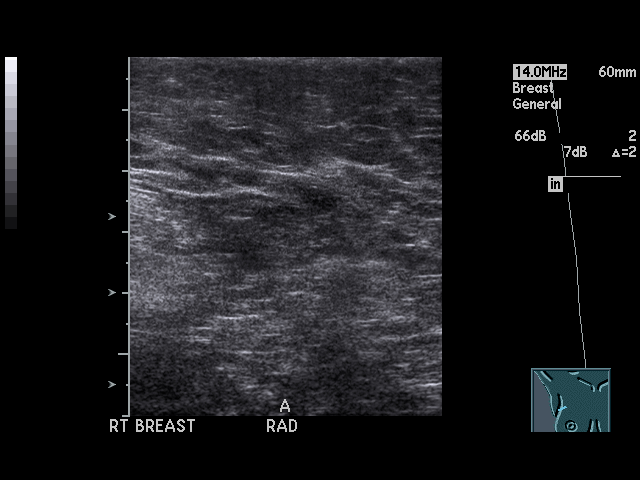
[im 14/36]
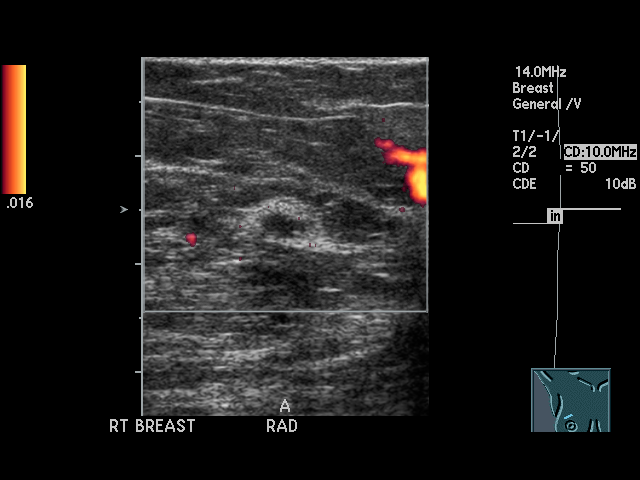
[im 17/36]
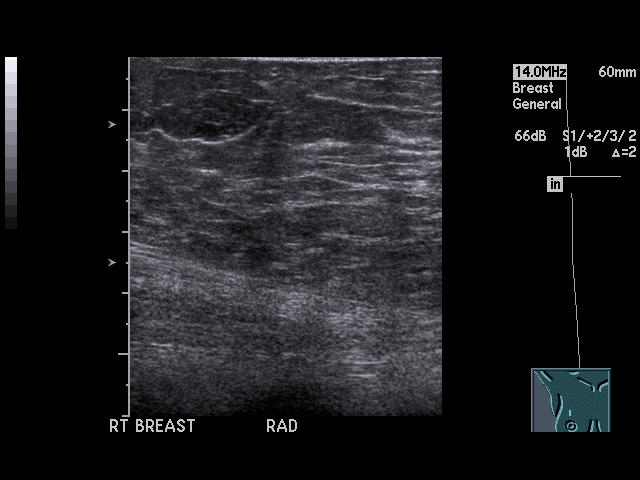
[im 18/36]
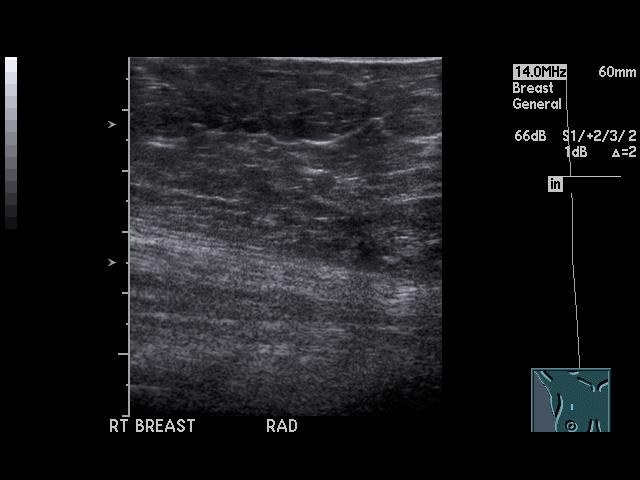
[im 19/36]
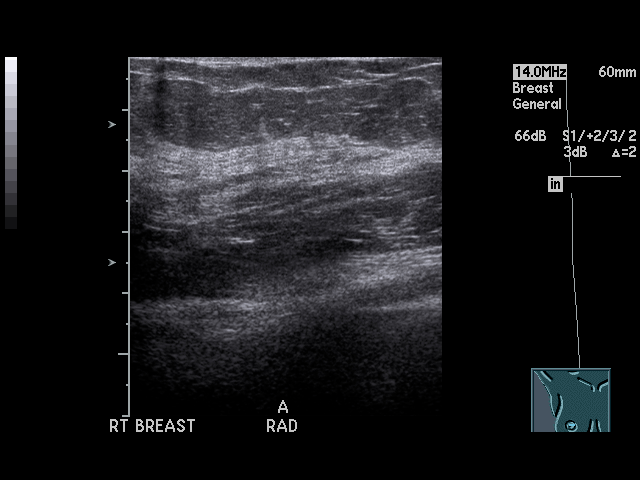
[im 22/36]
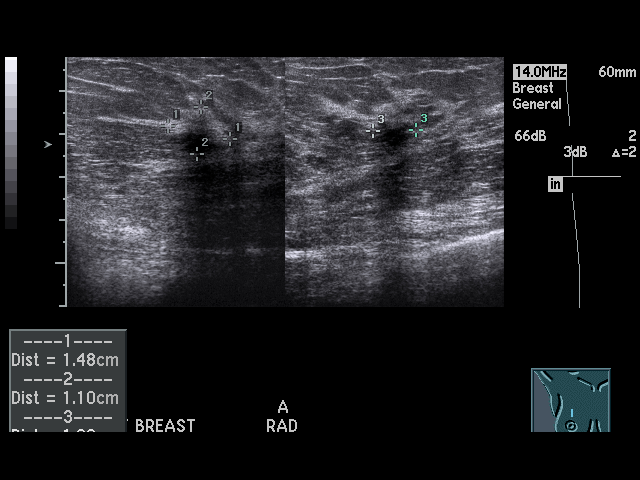
[im 24/36]
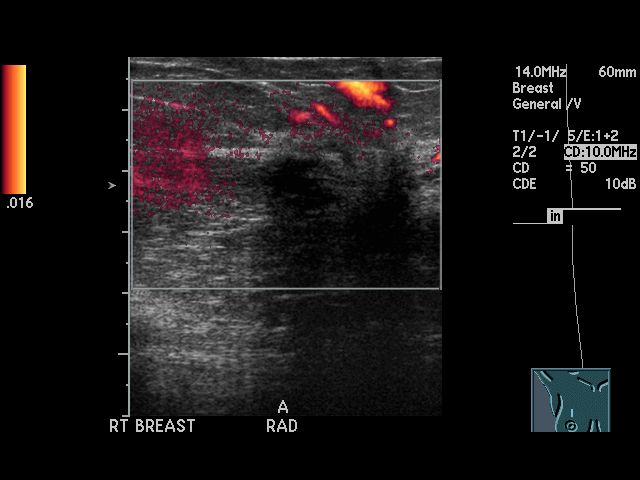
[im 27/36]
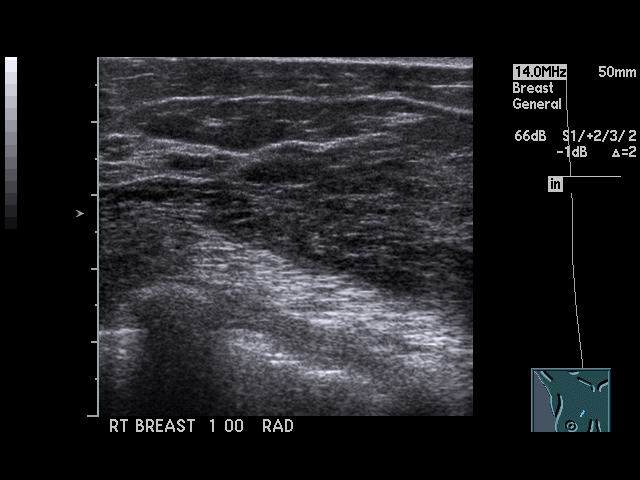
[im 28/36]
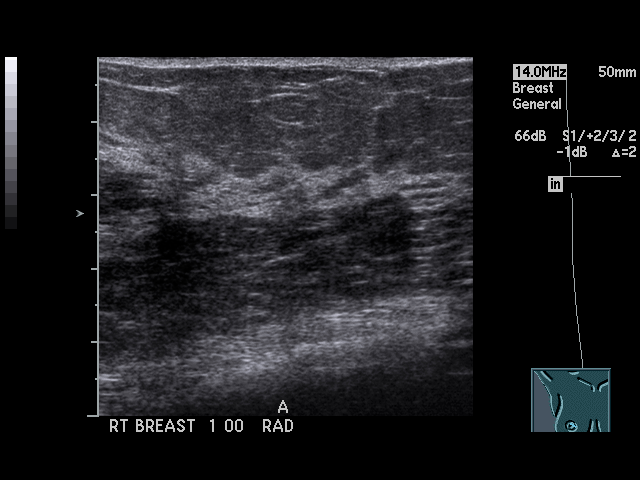
[im 31/36]
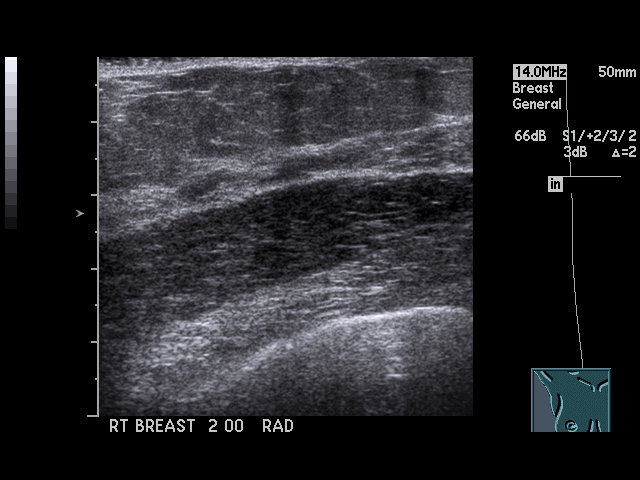
[im 33/36]
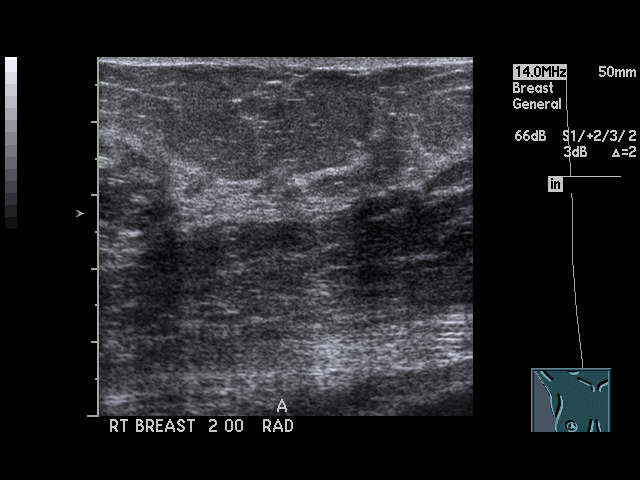
[im 36/36]
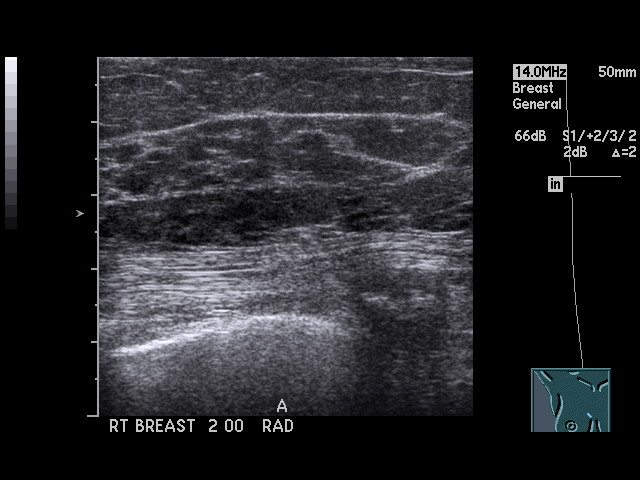

[17 of 25 positions shown; findings below may reference images not displayed]

PROCEDURE:     US  - US BREAST RIGHT  - May 27, 2009 [DATE]

RESULT:      The patient returned for sonographic evaluation of the upper
portion of the right breast.  Scanning was performed from the 9 o'clock to
the 2 o'clock region.  In the 11 o'clock area there is a hyperechoic nodule
with a hypoechoic center measuring 6.7 x 4.6 x 7.0 cm.  In the 12 o'clock
region, there is an irregularly marginated complex echotexture mass with
shadowing measuring 1.5 x 1.1 x 1.0 cm.  Surgical consultation is
recommended with consideration for biopsy. Malignancy is not excluded.
IMPRESSION: 1.      BI-RADS:  Category 4- Suspicious Abnormality.
2.      BI-RADS 4 for lesions in the superior portion of the right breast in
the 12 o'clock and possibly 11 o'clock regions.  The 12 o'clock lesion is
more worrisome but the 11 o'clock lesion is nonspecific and should be
investigated.

## 2011-02-09 ENCOUNTER — Ambulatory Visit: Payer: Self-pay | Admitting: Unknown Physician Specialty

## 2011-02-14 LAB — PATHOLOGY REPORT

## 2011-02-20 ENCOUNTER — Telehealth: Payer: Self-pay | Admitting: *Deleted

## 2011-02-20 NOTE — Telephone Encounter (Signed)
Pt called stating since told to take Pravastatin 40mg  every day she has had bad cramps in legs. Prior to this, she was taking 40mg  QOD, with no complaints. She has been on Lipitor and Zocor previously which had caused her liver enzymes to increase during chemo as well at that time. Can pt go back to qod, or take 20 mg daily? Please advise.

## 2011-02-20 NOTE — Telephone Encounter (Signed)
She can go back to 40 qod.

## 2011-02-20 NOTE — Telephone Encounter (Signed)
Pt.notified

## 2011-03-28 ENCOUNTER — Ambulatory Visit: Payer: Self-pay | Admitting: Internal Medicine

## 2011-05-01 IMAGING — US ULTRASOUND RIGHT BREAST
1 series · 17 of 18 positions shown · non-contrast
Comparison: none

REASON FOR EXAM: Rt Breast 11 to 12 oclock Hyperechois Nodule Plus Shadow
at 12 Oclock
COMMENTS:

[Series 1: ultrasound right breast · 17 of 18 slices shown]
[im 1/18]
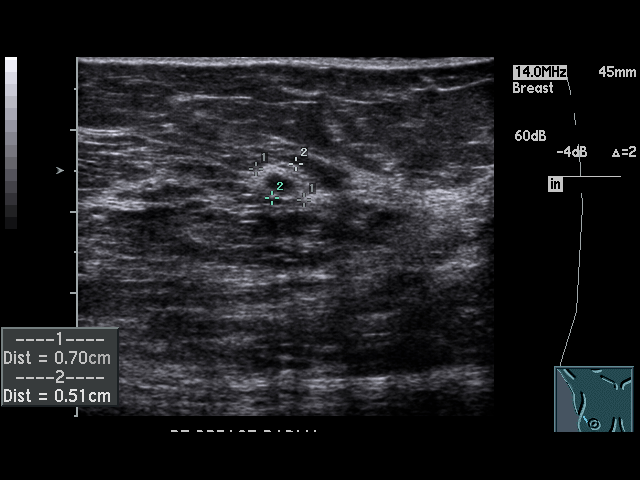
[im 2/18]
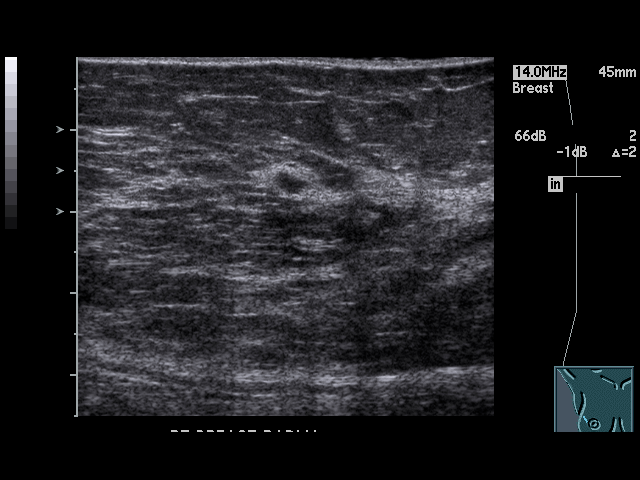
[im 3/18]
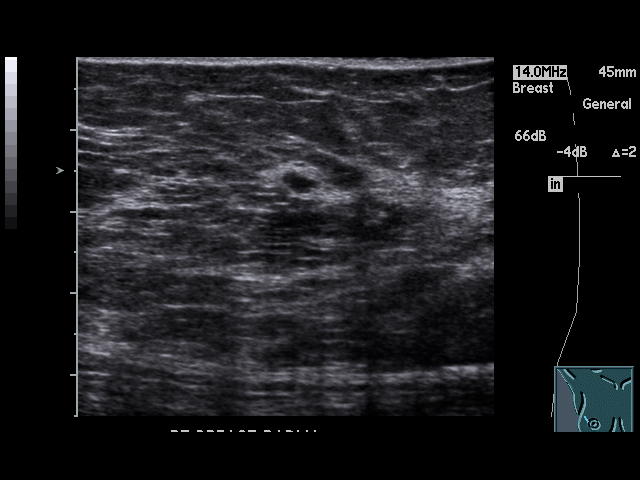
[im 4/18]
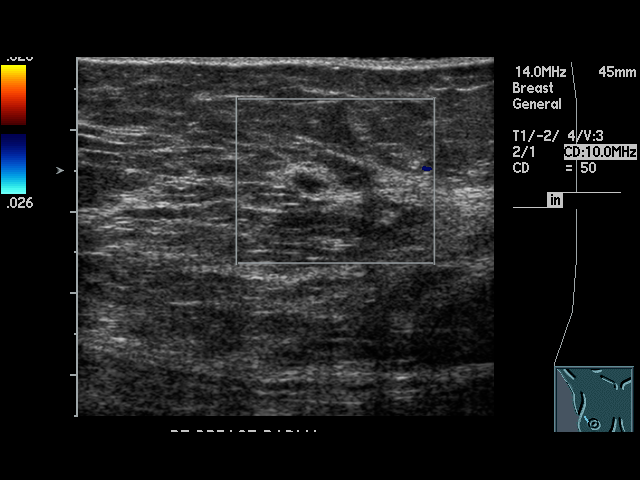
[im 5/18]
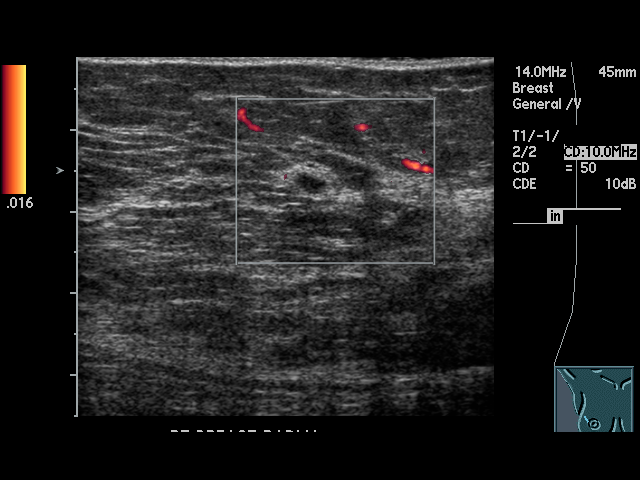
[im 6/18]
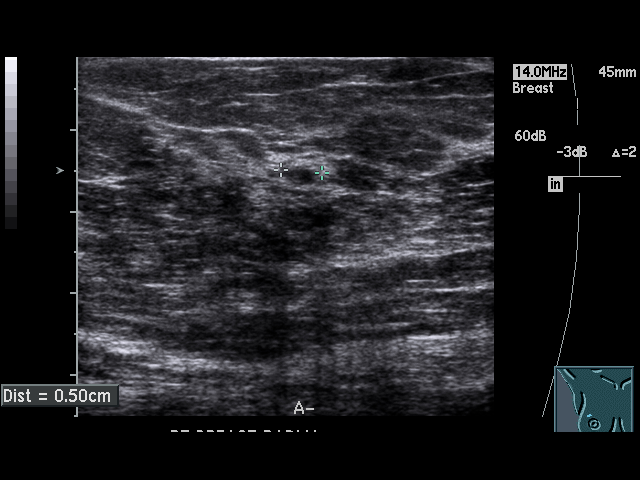
[im 7/18]
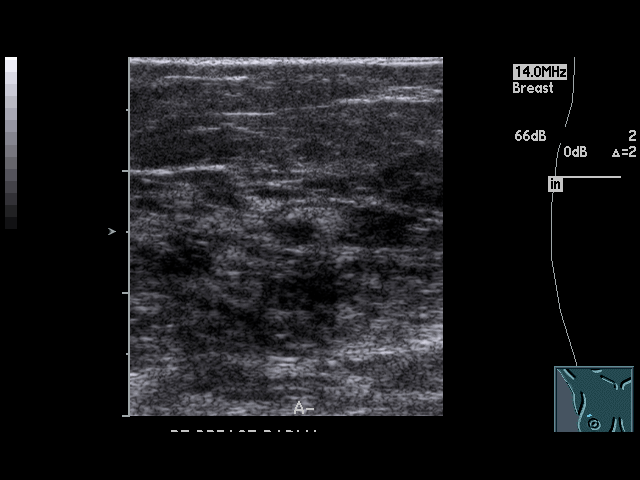
[im 8/18]
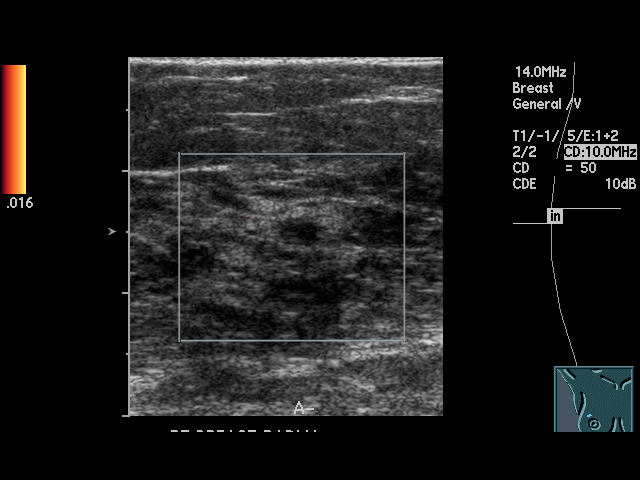
[im 10/18]
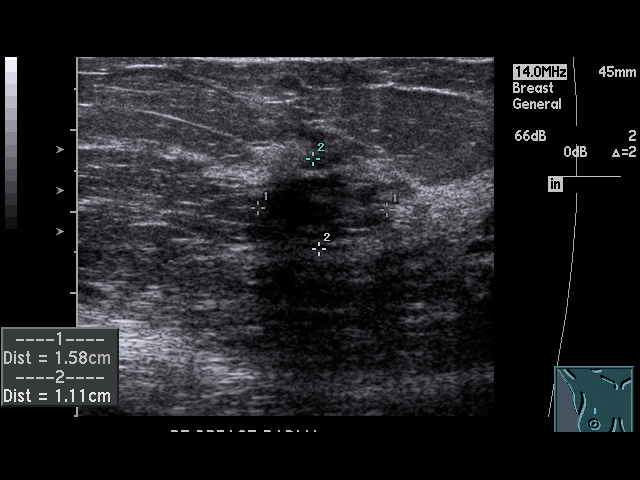
[im 11/18]
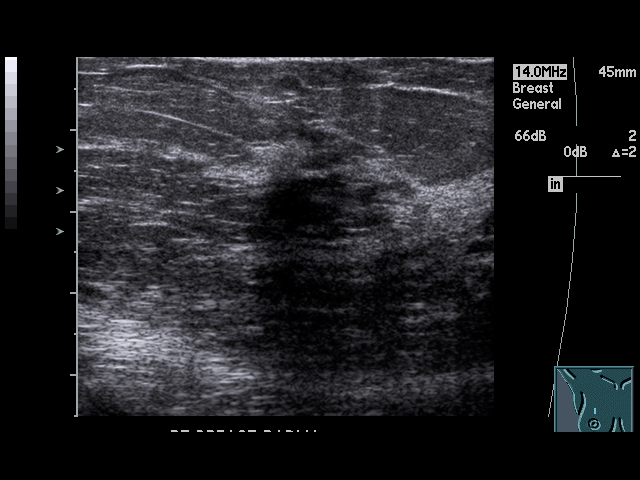
[im 12/18]
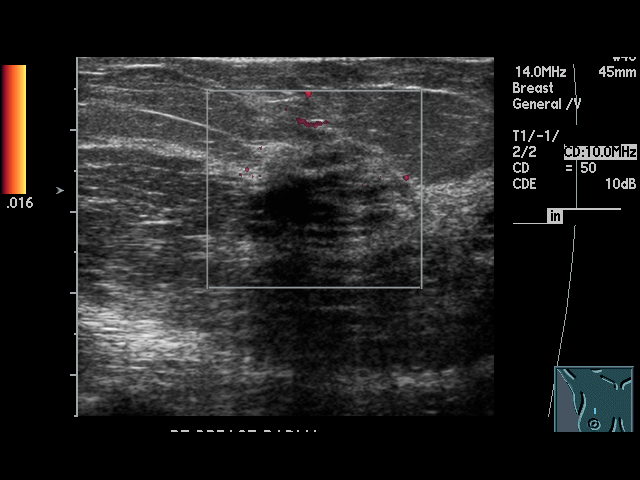
[im 13/18]
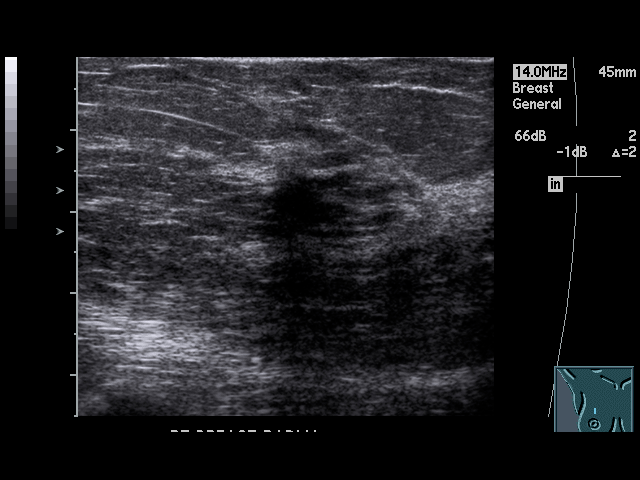
[im 14/18]
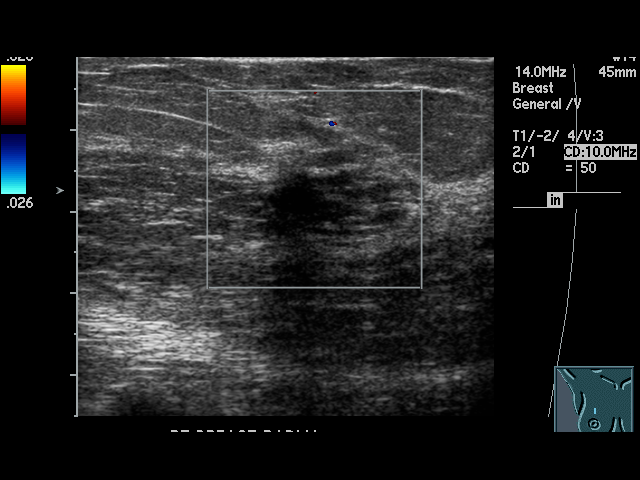
[im 15/18]
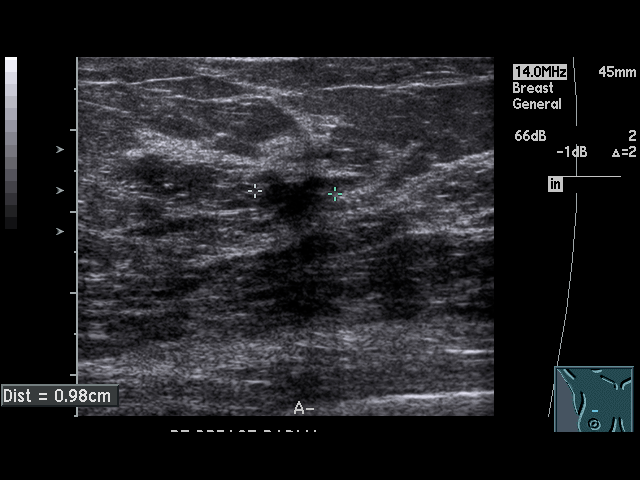
[im 16/18]
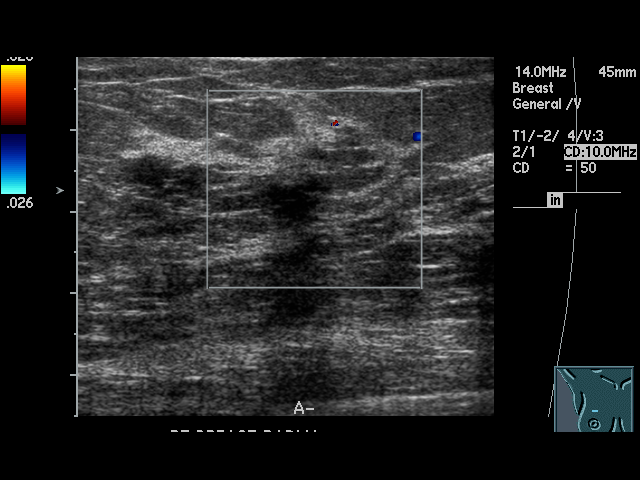
[im 17/18]
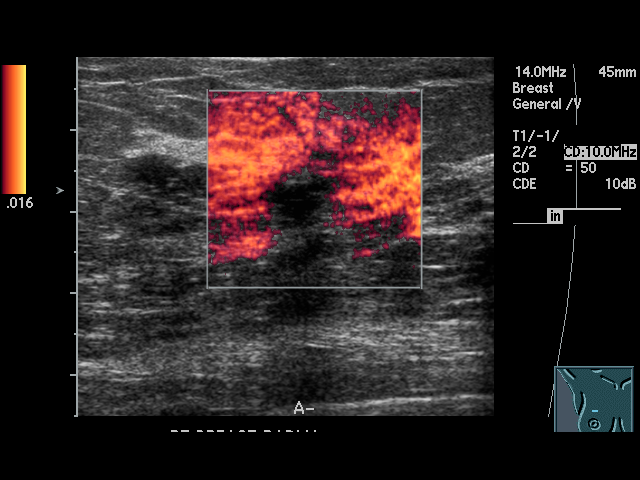
[im 18/18]
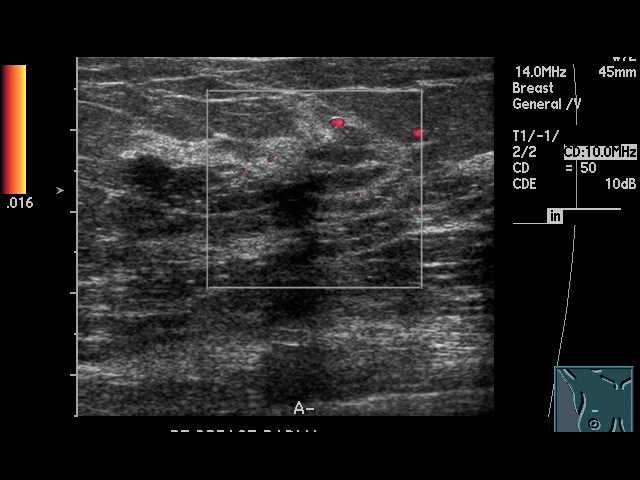

[17 of 18 positions shown; findings below may reference images not displayed]

PROCEDURE:     US  - US BREAST RIGHT  - September 05, 2009 [DATE]

RESULT:      The patient returns for additional sonographic follow up of the
right breast.  In the 11 o'clock region there is a 0.7 x 0.51 x
thick-walled hypoechoic area without definite shadowing or acoustic
enhancement.  Compared to the previous images from 05/19/09 this does not
appear to be dramatically different.  In the 12 o'clock region, there is an
irregularly marginated 1.58 x 1.11 x 0.98 cm area of overall decreased
echogenicity with shadowing suggestive of malignancy.  Color Doppler images
do not show definite evidence of blood flow to either lesion.  The 12
o'clock lesion may be slightly smaller but the measurements are not
significantly different.
IMPRESSION: 1.      Abnormal breast ultrasound in the 11 and 12 o'clock region as
demonstrated previously without significant change.  Underlying malignancy
is not excluded especially in the 12 o'clock region.
2.     BI-RADS:  Category 4- Suspicious Abnormality.

## 2011-05-25 IMAGING — CR DG CHEST 2V
1 series · 3 of 3 positions shown · non-contrast
Comparison: none

REASON FOR EXAM: colon ca,lung ca
COMMENTS:

[Series 1: view not recorded · 0.17mm/px · 3 of 3 slices shown]
[im 1/3]
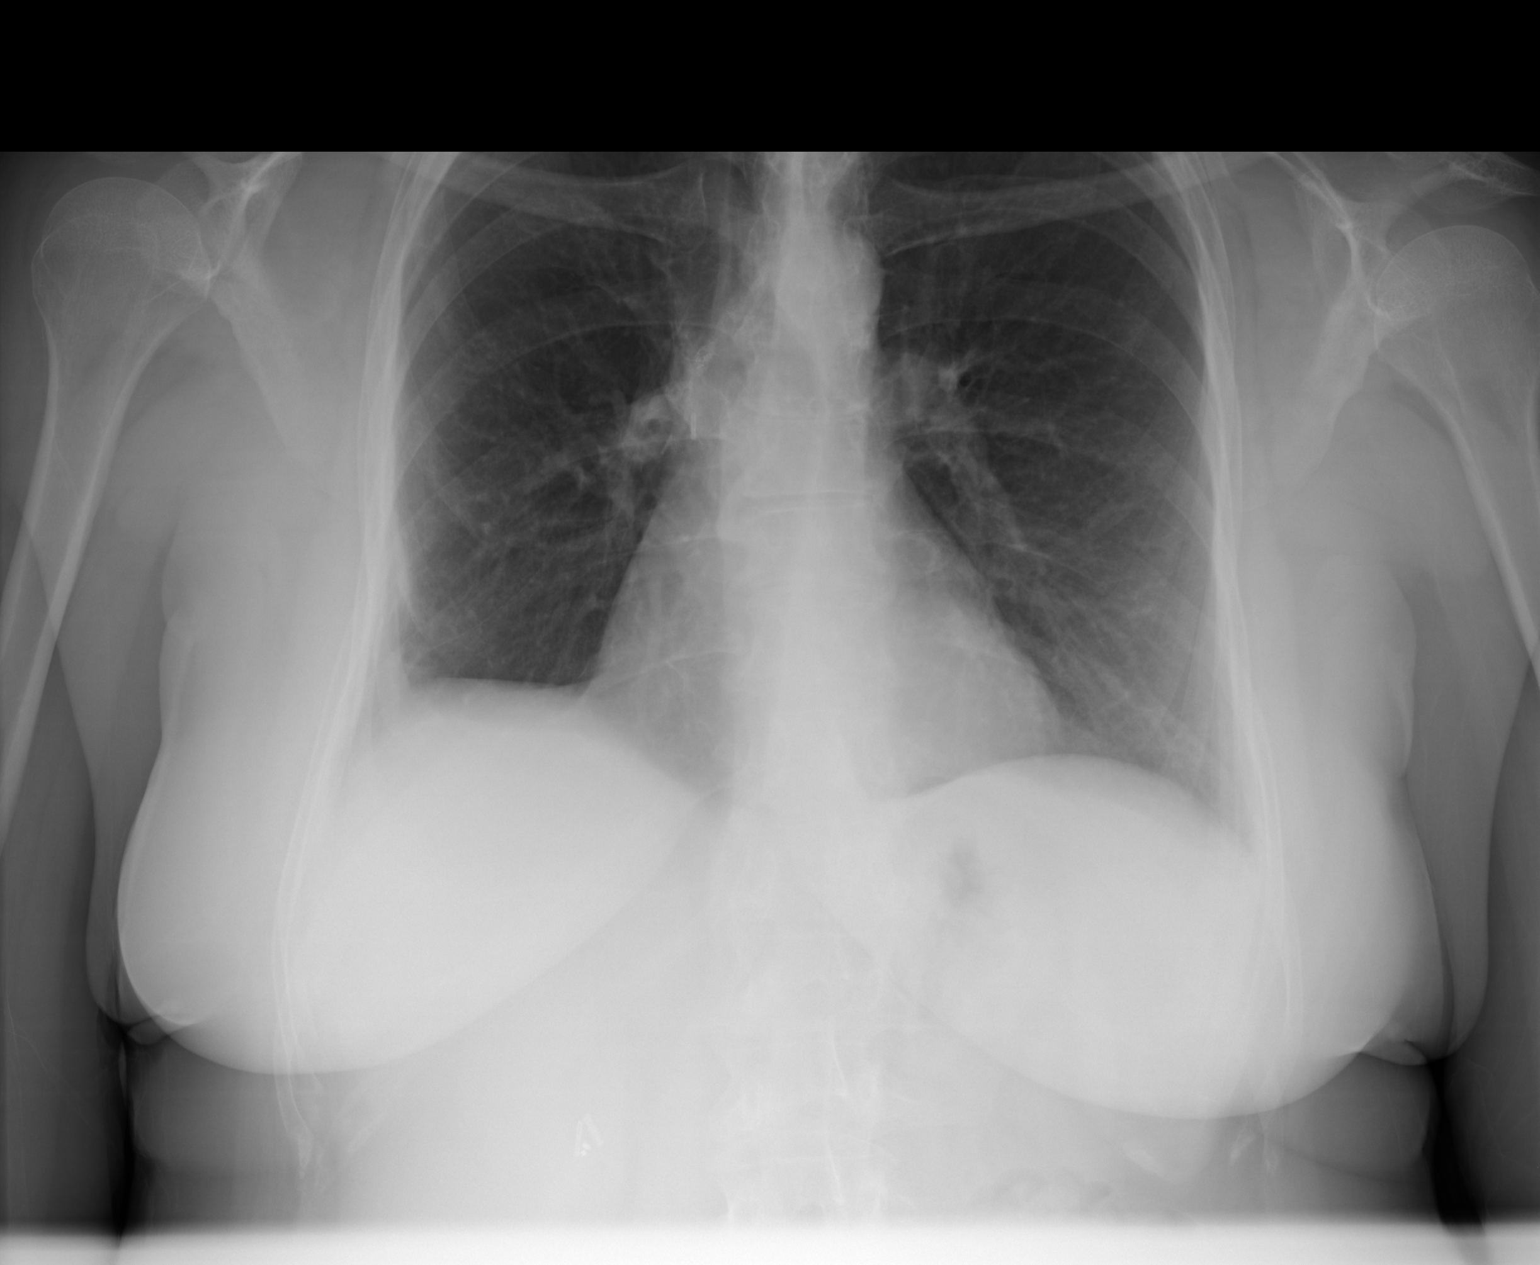
[im 2/3]
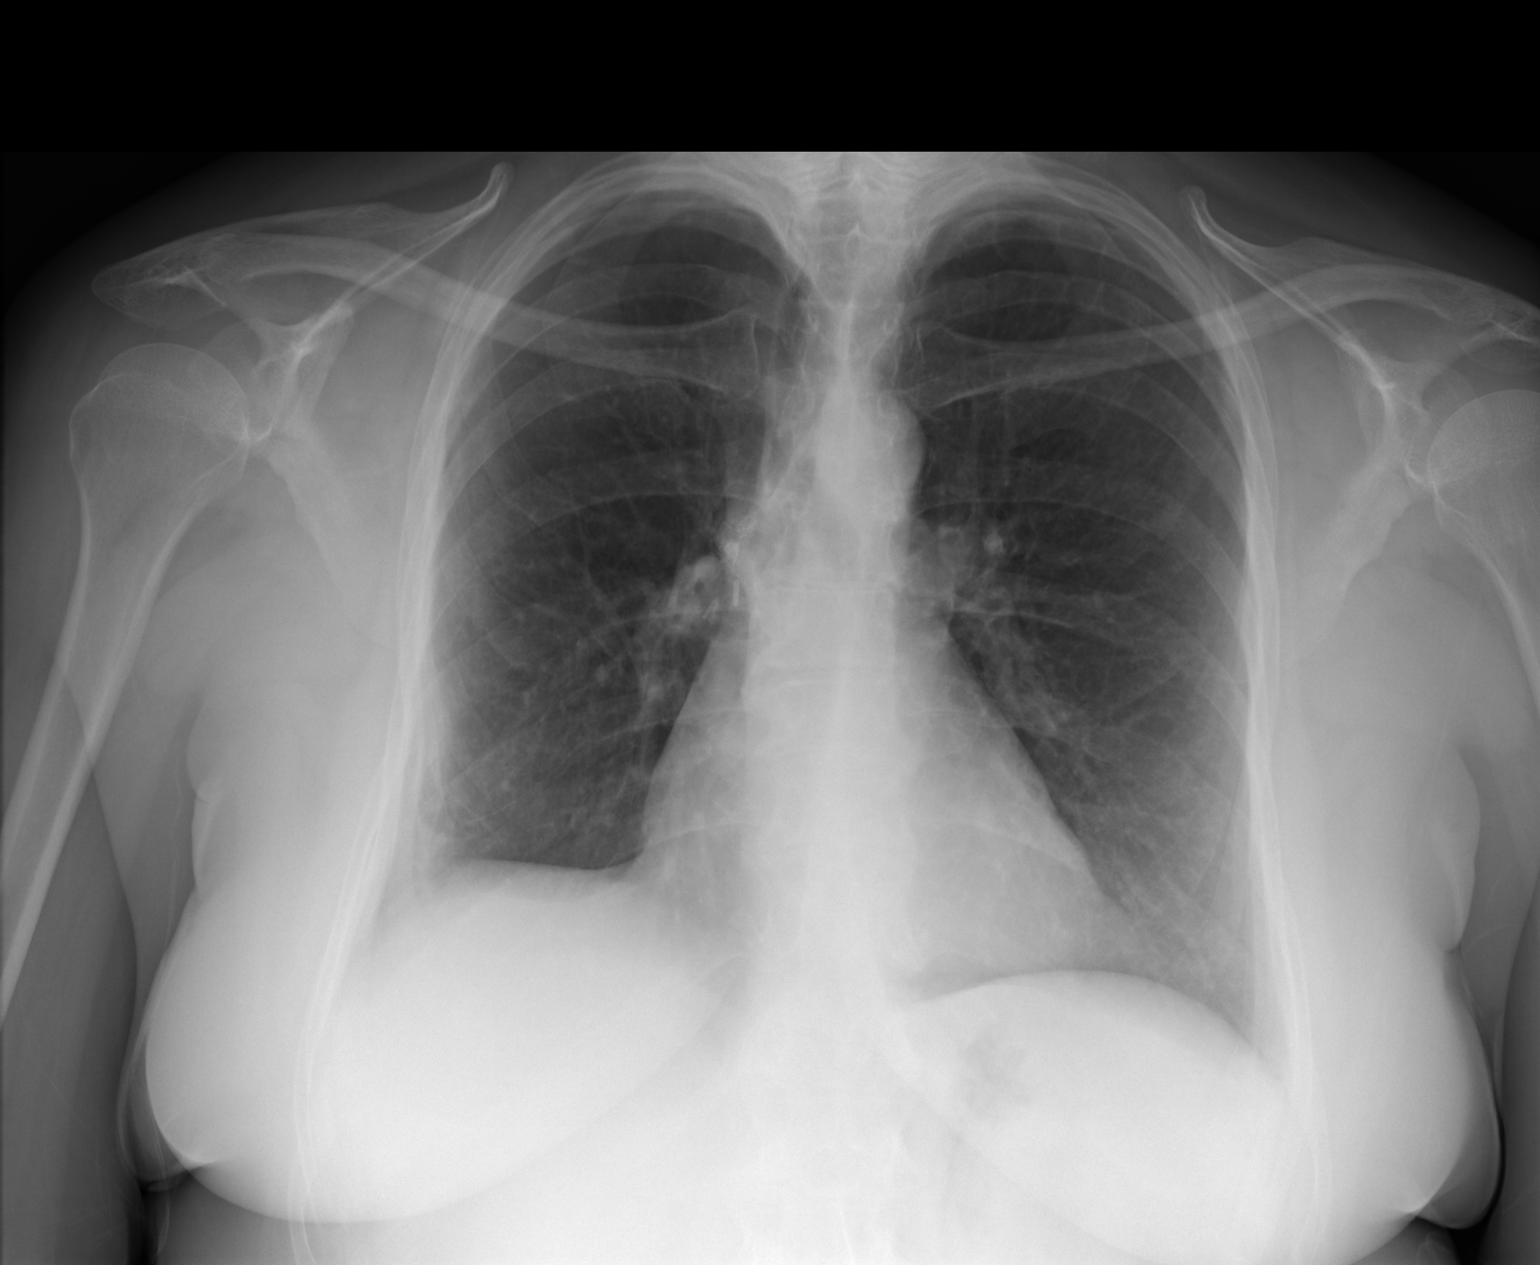
[im 3/3]
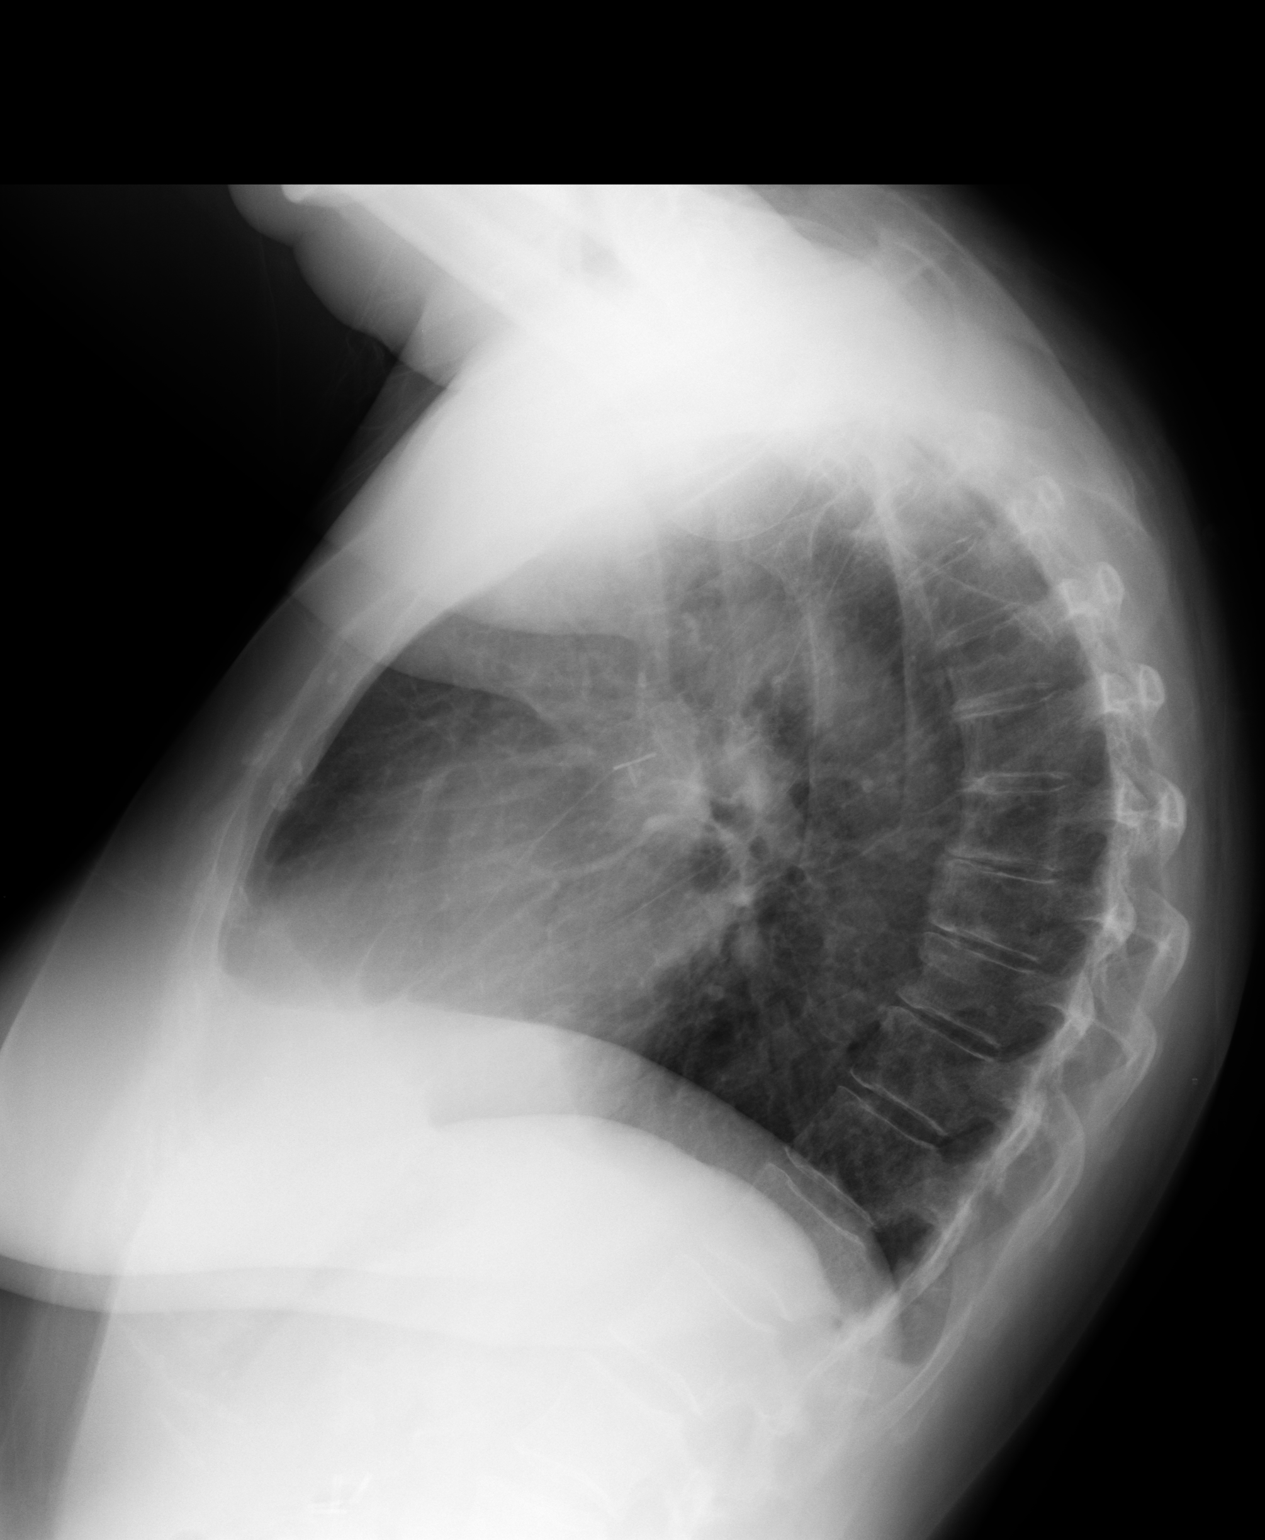

[3 of 3 positions shown; findings below may reference images not displayed]

PROCEDURE:     DXR - DXR CHEST PA (OR AP) AND LATERAL  - September 29, 2009 [DATE]

RESULT:     Comparison is made to a prior exam of 03/11/2007. There is
thickening of the pleural shadow laterally just above the right costophrenic
angle compatible with pleural fibrotic change. No pneumonia, pneumothorax or
pleural effusion is seen. No lung mass is identified. Heart size is normal.
No acute bony abnormalities are seen. Postoperative metallic clips are noted
at the level of the right hilum.
IMPRESSION: 1. No acute changes are identified.
2. No findings suspicious for metastatic disease are identified.

## 2011-06-02 IMAGING — US US NEEDLE LOCALIZATION*R*
1 series · 5 of 5 positions shown · non-contrast
Comparison: none

REASON FOR EXAM: right breast biopsy with us needle placement  at 017am
surgery  at 10am
COMMENTS:

[Series 1: us needle localization*right* · 5 of 5 slices shown]
[im 1/5]
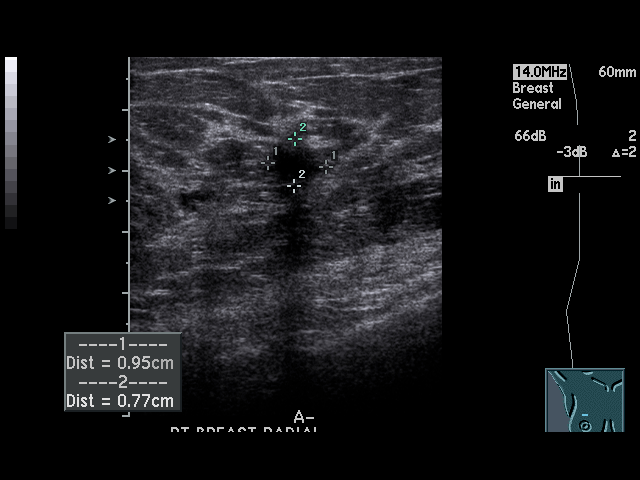
[im 2/5]
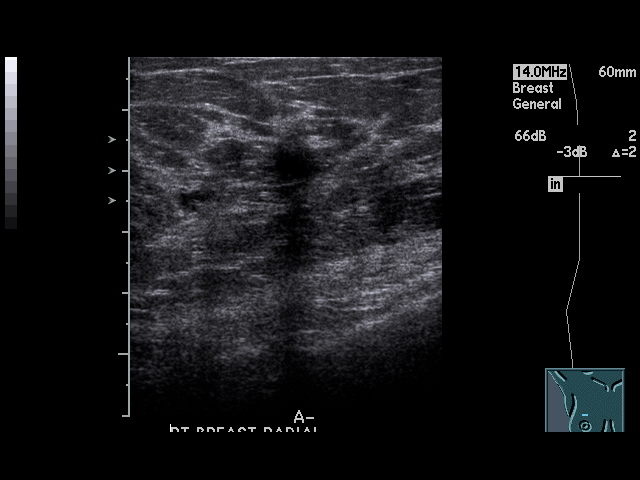
[im 3/5]
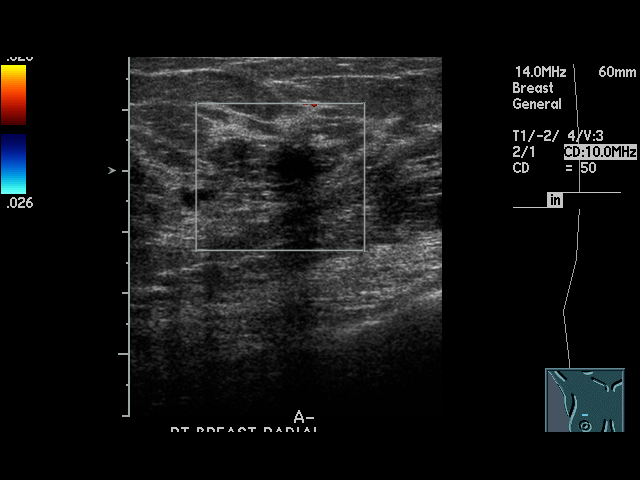
[im 4/5]
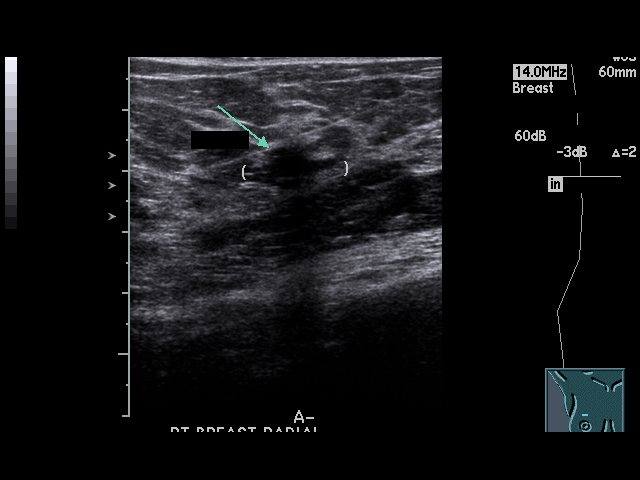
[im 5/5]
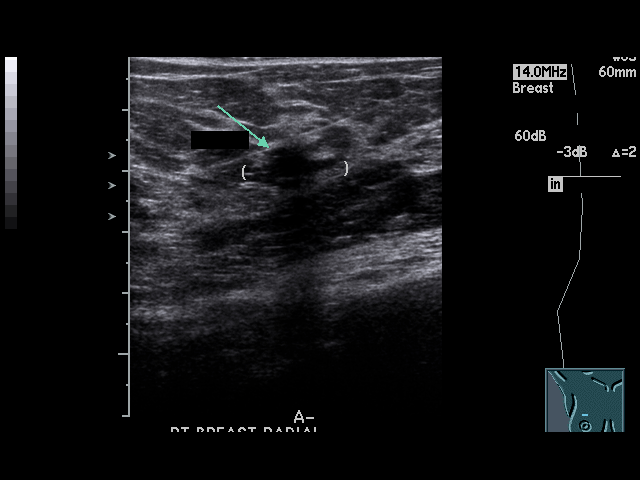

[5 of 5 positions shown; findings below may reference images not displayed]

PROCEDURE:     US  - US GUIDED NEEDLE LOCAL R BREAST  - October 07, 2009  [DATE]

RESULT:     The anticipated procedure was discussed with Ms. Trinajstic. She
voiced her willingness to proceed. A time out procedure was called. There is
no contraindication to the use of subcutaneous lidocaine nor cutaneous
iodine.

The nodule in question in the right breast at approximately [DATE] position
was localized with ultrasound. Review of prior studies was performed.
Sterile precautions were employed. The skin was cleansed with iodine
solution the subcutaneous tissues infiltrated with approximately 2 cc of 1%
buffered lidocaine. Subsequently a 7 cm Elias Chun wire was introduced
into the breast using a medial approach. The hook wire came to lie just
anterior to the nodule. The carrier needle was withdrawn. The patient
tolerated the procedure well.
IMPRESSION: The patient underwent successful ultrasound directed wire
localization of the hypoechoic nodule at approximately [DATE] position of the
right breast.

## 2011-06-07 ENCOUNTER — Ambulatory Visit: Payer: Self-pay | Admitting: Surgery

## 2011-06-26 ENCOUNTER — Telehealth: Payer: Self-pay | Admitting: Cardiology

## 2011-06-26 NOTE — Telephone Encounter (Signed)
Pt calling states that her cholesterol medicine is giving you leg cramps and wants to know if she can take something else. She has stopped taking it. Uses [facility]

## 2011-06-27 NOTE — Telephone Encounter (Signed)
Spoke to pt, she has been on Pravastatin 40mg  since 11/2010, started having leg cramps 1 week ago, that increased throughout week. She stopped med, cramps resolved. Since then her PMD, Dr. Dale Waves has changed her to Lipitor, pt does not know dose. She is going to try this med and let us know how it works. States she was on Lipitor years ago and had no problems, she was changed due to not generic at the time. Looks like Dr. Lorin Picket follows her lipids. I told pt to call back with dose and how Lipitor works for her. Will forward to Dr. Shirlee Latch.

## 2011-06-28 ENCOUNTER — Ambulatory Visit: Payer: Self-pay | Admitting: Oncology

## 2011-06-28 LAB — CBC CANCER CENTER
Basophil #: 0 x10 3/mm (ref 0.0–0.1)
Basophil %: 0.5 %
Eosinophil #: 0.3 x10 3/mm (ref 0.0–0.7)
HCT: 35.1 % (ref 35.0–47.0)
HGB: 12.1 g/dL (ref 12.0–16.0)
Lymphocyte #: 1.9 x10 3/mm (ref 1.0–3.6)
Lymphocyte %: 27.8 %
MCH: 29.6 pg (ref 26.0–34.0)
MCV: 86 fL (ref 80–100)
Monocyte %: 5.6 %
Neutrophil #: 4.1 x10 3/mm (ref 1.4–6.5)
Platelet: 212 x10 3/mm (ref 150–440)
RBC: 4.08 10*6/uL (ref 3.80–5.20)
RDW: 13.2 % (ref 11.5–14.5)
WBC: 6.7 x10 3/mm (ref 3.6–11.0)

## 2011-06-28 LAB — COMPREHENSIVE METABOLIC PANEL
Albumin: 3.3 g/dL — ABNORMAL LOW (ref 3.4–5.0)
Alkaline Phosphatase: 103 U/L (ref 50–136)
BUN: 22 mg/dL — ABNORMAL HIGH (ref 7–18)
Calcium, Total: 9.1 mg/dL (ref 8.5–10.1)
EGFR (African American): 53 — ABNORMAL LOW
Glucose: 98 mg/dL (ref 65–99)
SGOT(AST): 18 U/L (ref 15–37)
SGPT (ALT): 28 U/L
Total Protein: 7.2 g/dL (ref 6.4–8.2)

## 2011-06-29 LAB — CEA: CEA: 0.9 ng/mL (ref 0.0–4.7)

## 2011-07-13 ENCOUNTER — Ambulatory Visit: Payer: Self-pay | Admitting: Oncology

## 2011-08-08 IMAGING — CR DG CHEST 2V
1 series · 2 of 2 positions shown · non-contrast
Comparison: none

REASON FOR EXAM: Shortness of Breath
COMMENTS:   May transport without cardiac monitor

[Series 1: view not recorded · 0.17mm/px · 2 of 2 slices shown]
[im 1/2]
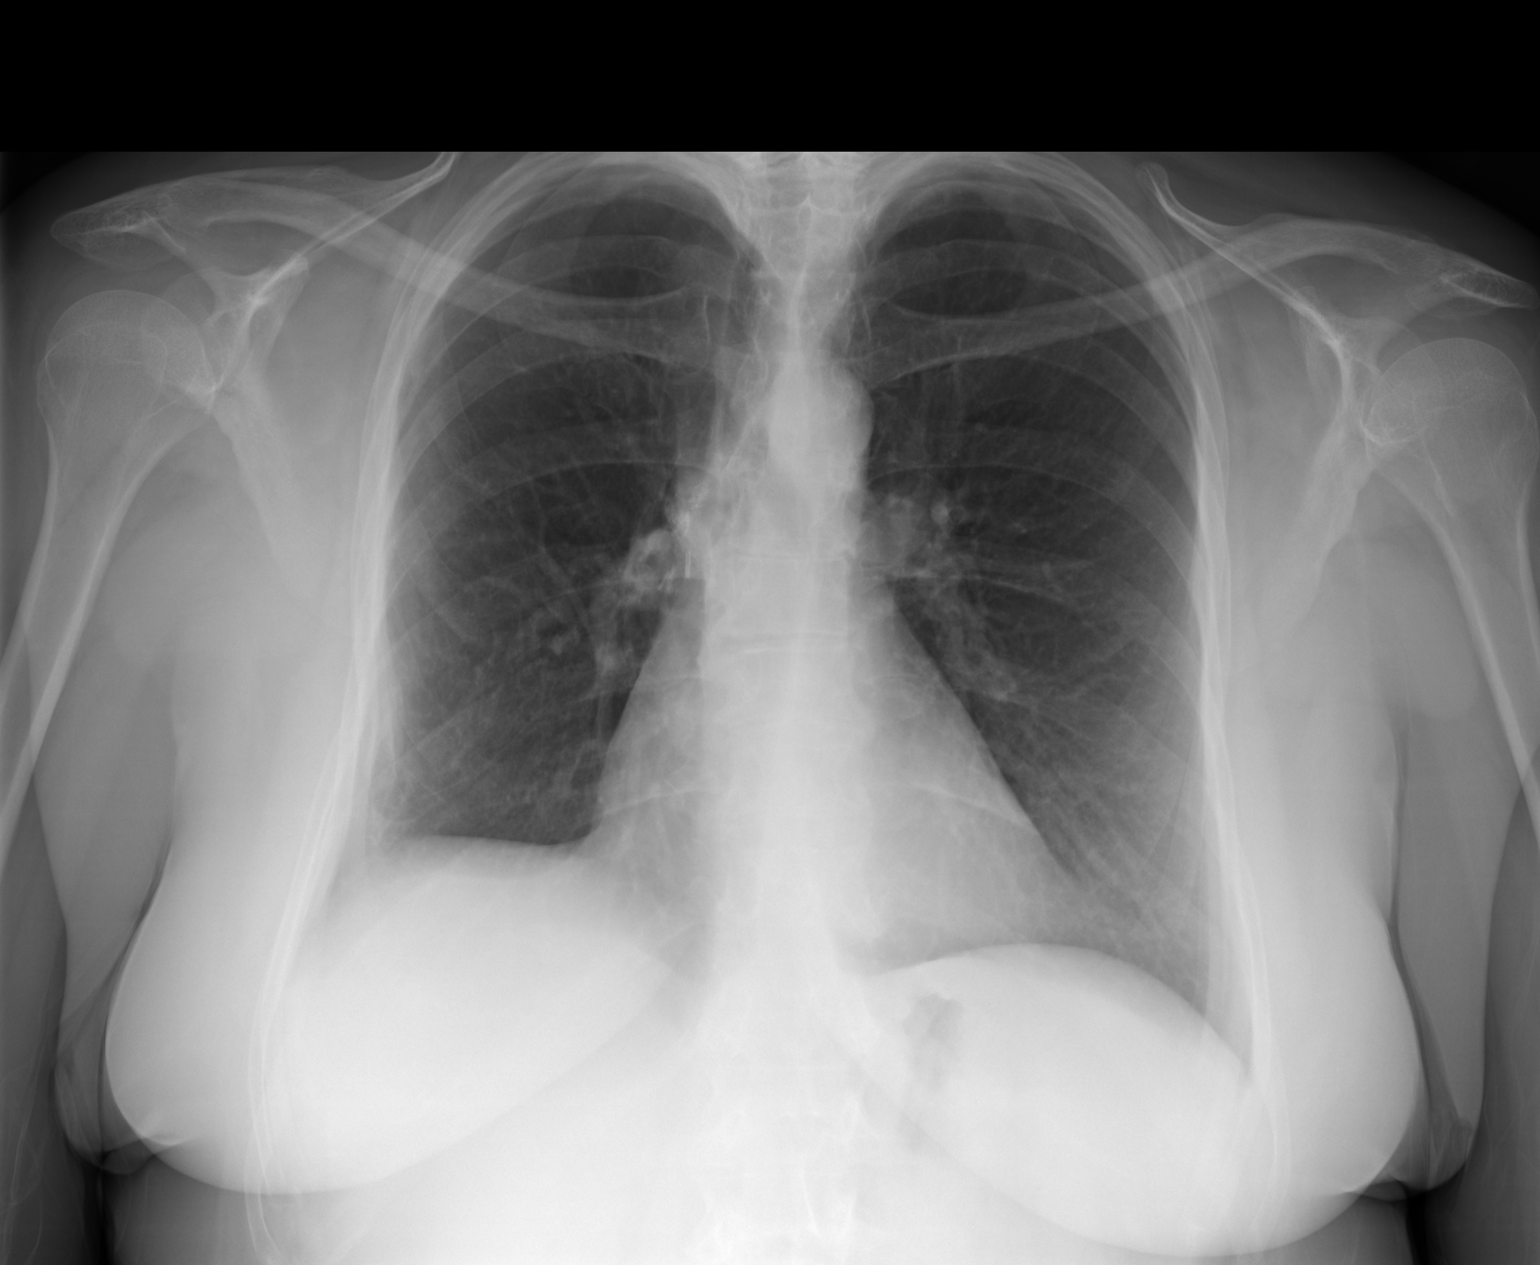
[im 2/2]
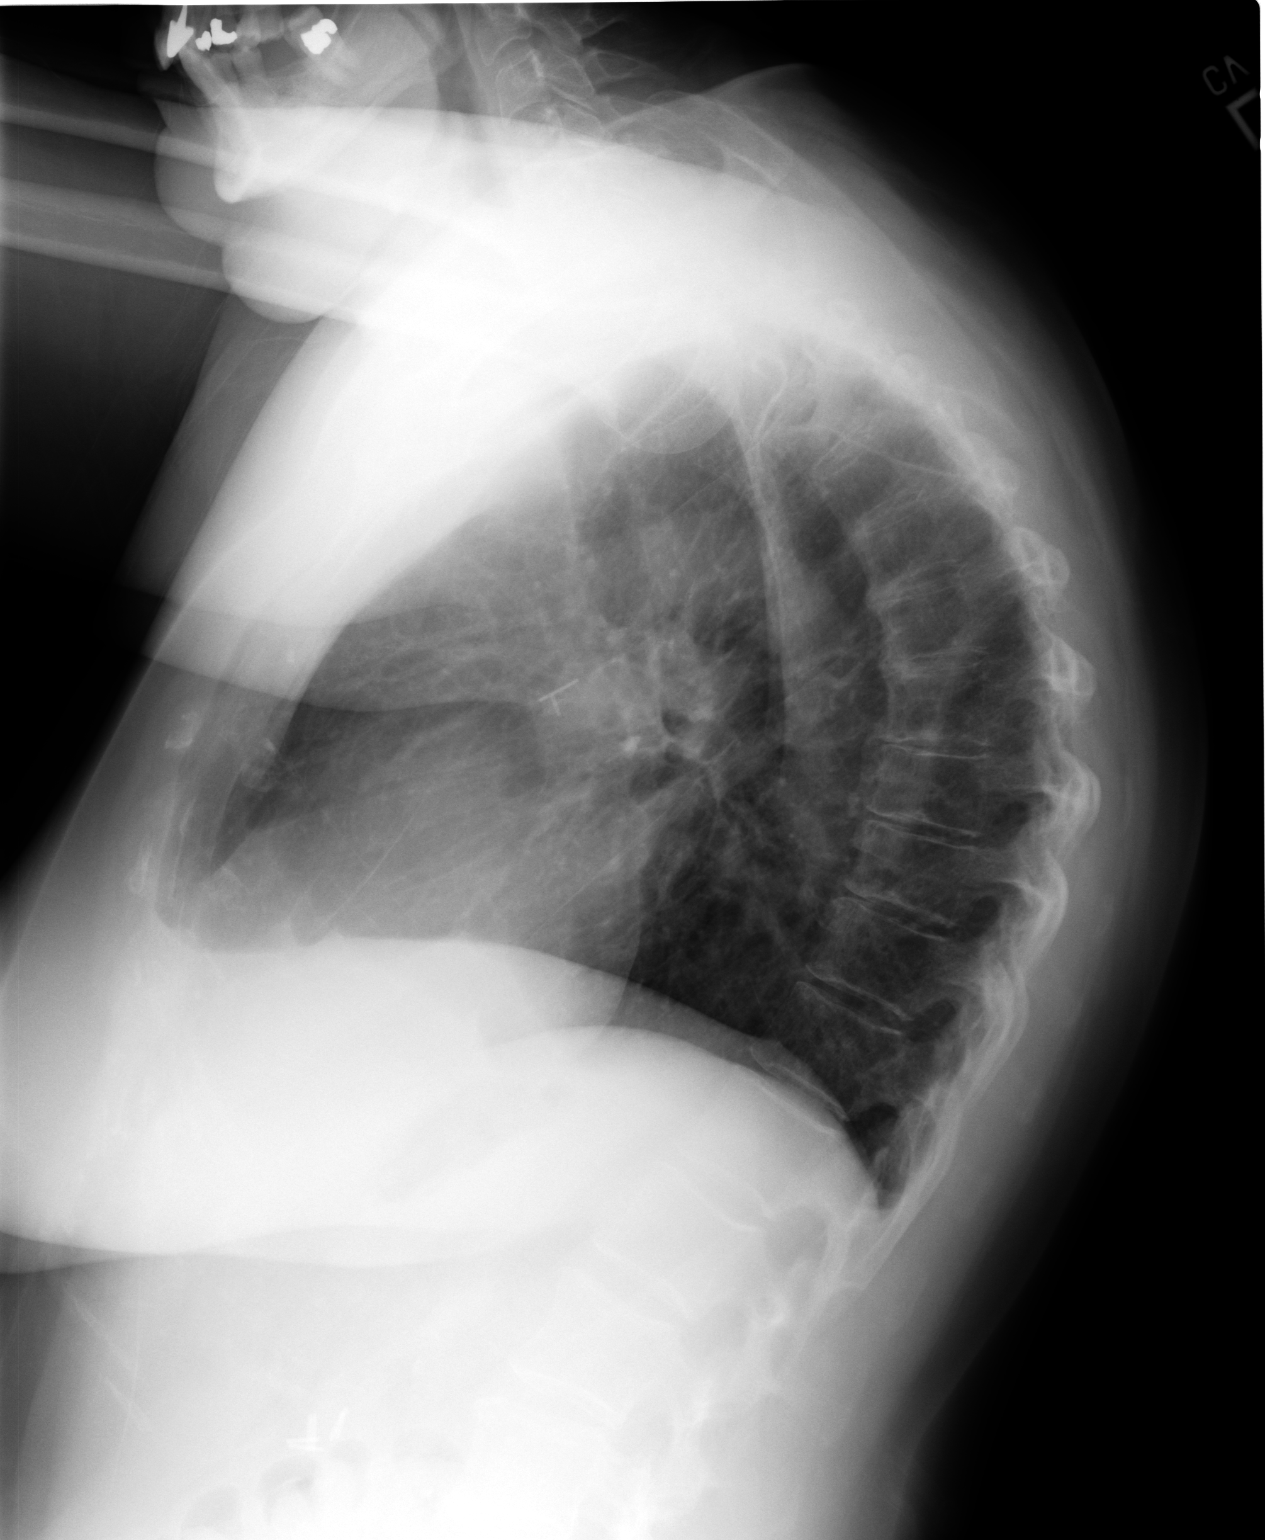

[2 of 2 positions shown; findings below may reference images not displayed]

PROCEDURE:     DXR - DXR CHEST PA (OR AP) AND LATERAL  - December 13, 2009  [DATE]

RESULT:     Comparison is made to the study 29 September, 2009.

The lungs are mildly hyperinflated but clear. The cardiac silhouette is
normal in size. The pulmonary vascularity is not engorged. There is a
surgical clip in the right hilar region The bony thorax exhibits no acute
abnormality.
IMPRESSION: I do not see evidence of acute cardiopulmonary abnormality.
I cannot exclude underlying COPD in the appropriate clinical setting.

## 2011-12-26 ENCOUNTER — Ambulatory Visit: Payer: Self-pay | Admitting: Oncology

## 2011-12-26 LAB — COMPREHENSIVE METABOLIC PANEL
Albumin: 3.4 g/dL (ref 3.4–5.0)
Alkaline Phosphatase: 117 U/L (ref 50–136)
Anion Gap: 10 (ref 7–16)
Bilirubin,Total: 0.3 mg/dL (ref 0.2–1.0)
Calcium, Total: 9.6 mg/dL (ref 8.5–10.1)
Co2: 28 mmol/L (ref 21–32)
Creatinine: 1.28 mg/dL (ref 0.60–1.30)
Glucose: 101 mg/dL — ABNORMAL HIGH (ref 65–99)
Osmolality: 282 (ref 275–301)
Potassium: 3.7 mmol/L (ref 3.5–5.1)
SGOT(AST): 23 U/L (ref 15–37)

## 2011-12-26 LAB — CBC CANCER CENTER
Basophil %: 0.8 %
Eosinophil %: 1.8 %
HCT: 37.6 % (ref 35.0–47.0)
Lymphocyte #: 2.1 x10 3/mm (ref 1.0–3.6)
Lymphocyte %: 27.1 %
MCH: 28.5 pg (ref 26.0–34.0)
MCHC: 32.8 g/dL (ref 32.0–36.0)
Monocyte #: 0.4 x10 3/mm (ref 0.2–0.9)
Neutrophil #: 5.1 x10 3/mm (ref 1.4–6.5)
Platelet: 224 x10 3/mm (ref 150–440)
RDW: 12.8 % (ref 11.5–14.5)
WBC: 7.8 x10 3/mm (ref 3.6–11.0)

## 2011-12-27 LAB — CEA: CEA: 0.8 ng/mL (ref 0.0–4.7)

## 2012-01-10 ENCOUNTER — Ambulatory Visit: Payer: Self-pay | Admitting: Oncology

## 2012-01-17 IMAGING — US ULTRASOUND RIGHT BREAST
1 series · 17 of 24 positions shown · non-contrast
Comparison: none

REASON FOR EXAM: RT PARENCHYMAL DENSITY
COMMENTS:

[Series 1: ultrasound right breast · 17 of 24 slices shown]
[im 1/24]
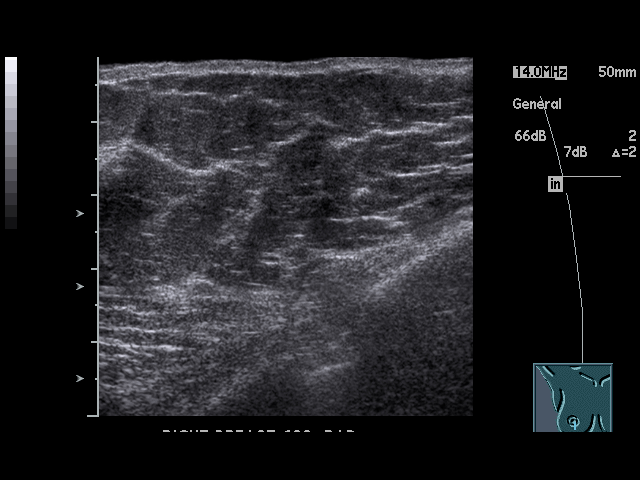
[im 3/24]
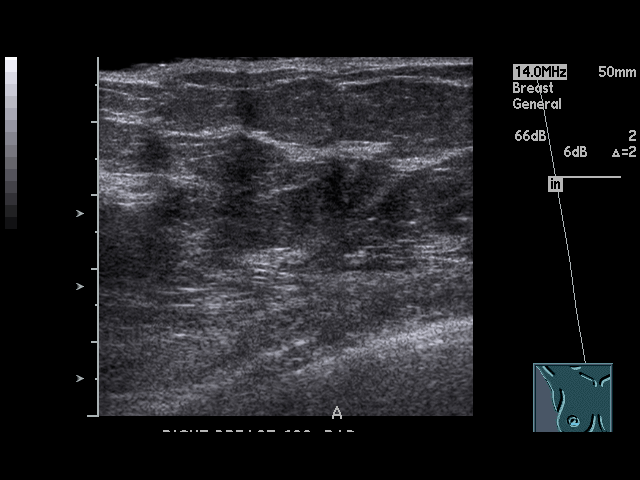
[im 4/24]
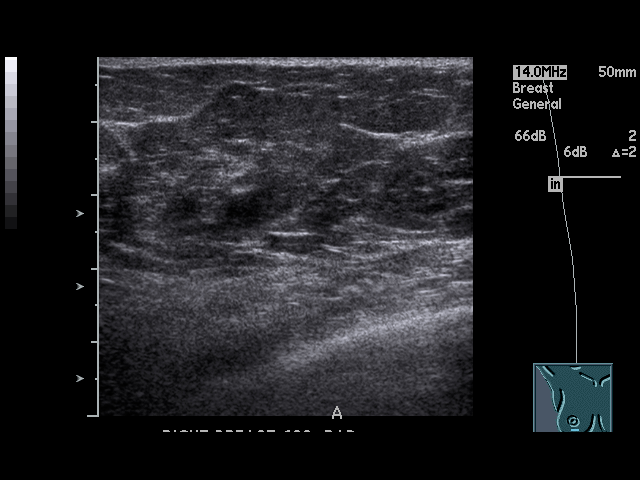
[im 5/24]
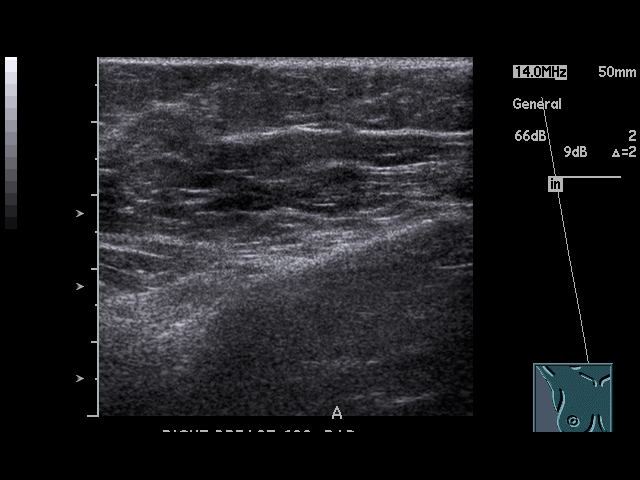
[im 7/24]
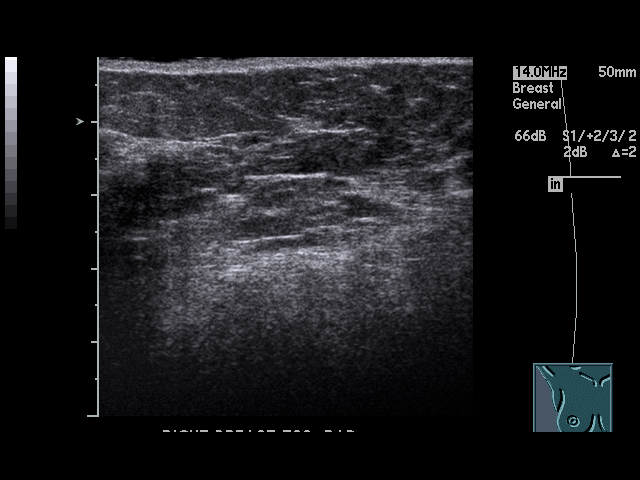
[im 8/24]
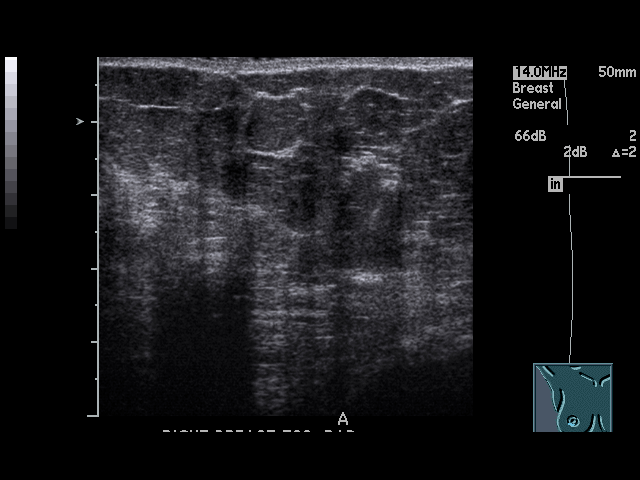
[im 10/24]
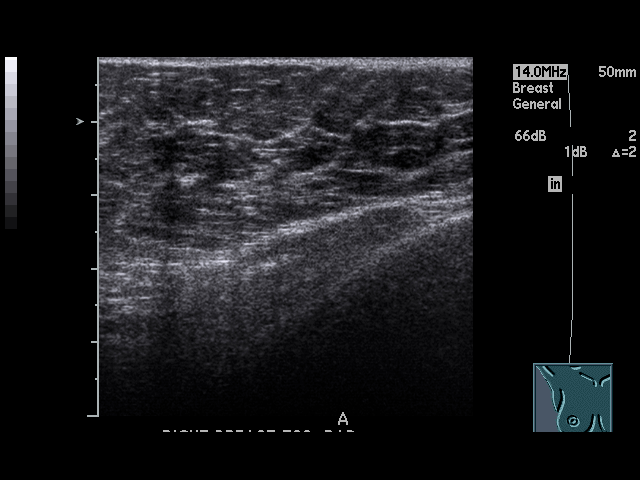
[im 11/24]
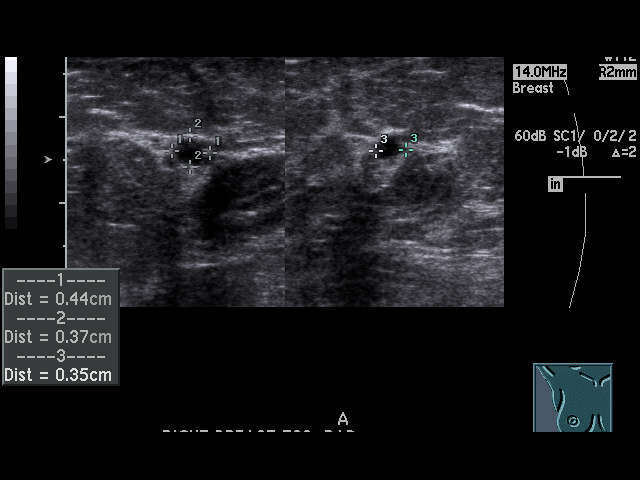
[im 13/24]
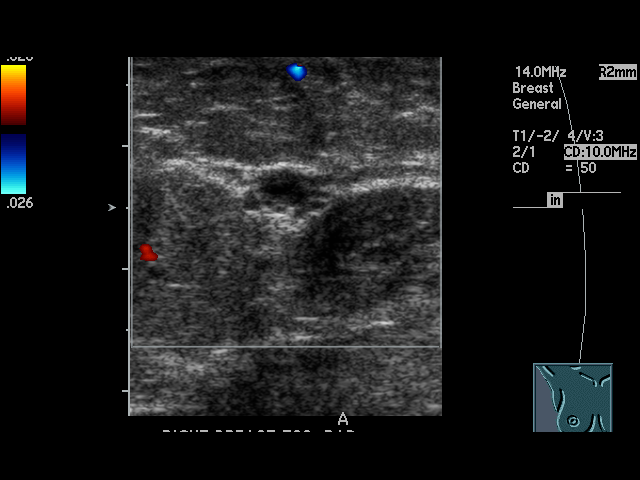
[im 14/24]
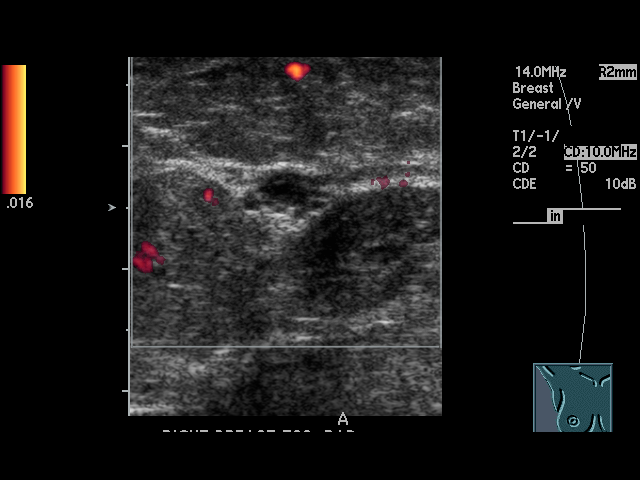
[im 15/24]
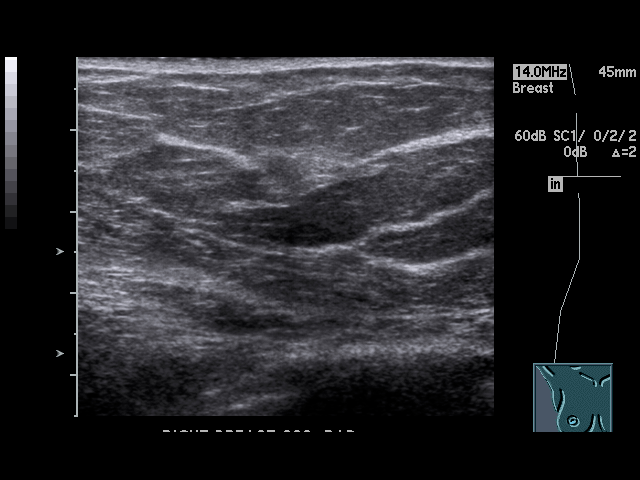
[im 17/24]
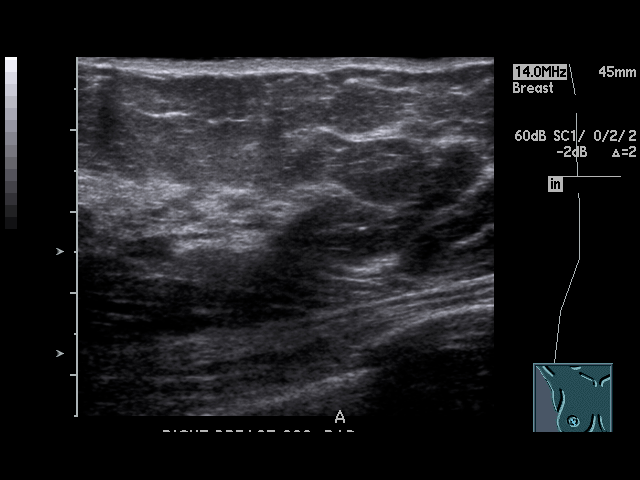
[im 18/24]
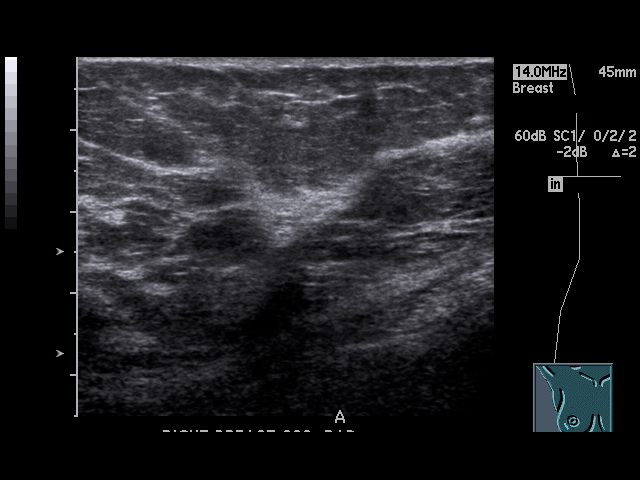
[im 20/24]
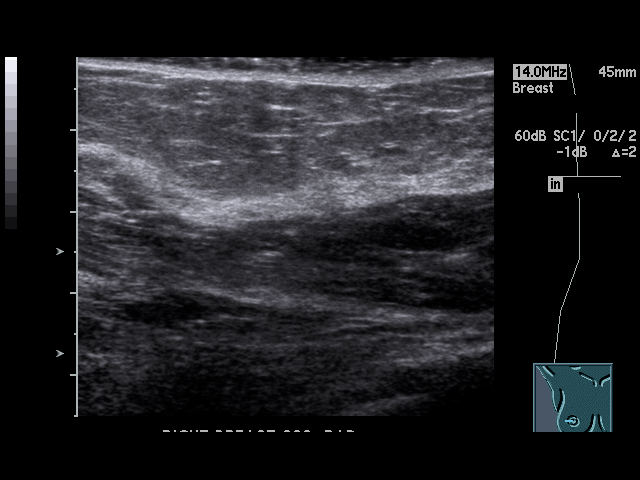
[im 21/24]
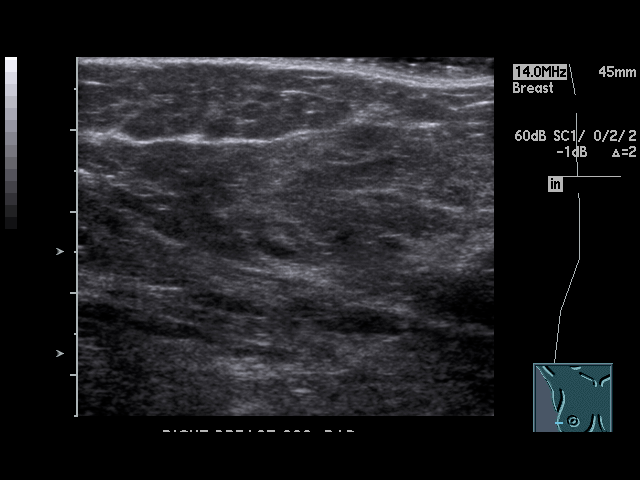
[im 22/24]
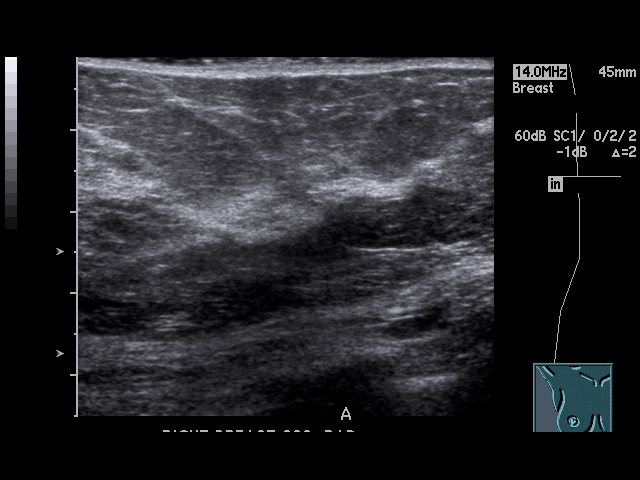
[im 24/24]
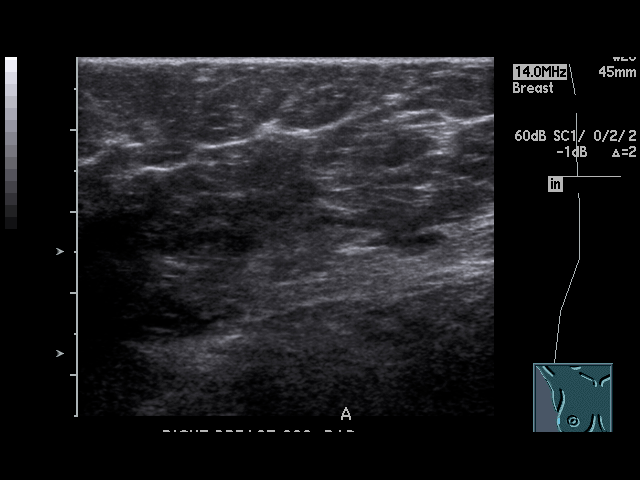

[17 of 24 positions shown; findings below may reference images not displayed]

PROCEDURE:     US  - US BREAST RIGHT  - May 24, 2010 [DATE]

RESULT:     There is an irregular area of parenchymal density in the lower
lateral posterior right breast on mammography. Targeted ultrasound in the
area reveals a tiny hypoechoic, irregularly marginated area measuring 3.7 x
3.5 x 4.4 mm.  No additional sonographic abnormality is evident. Please see
the separate dictation for the additional view study.
IMPRESSION:

## 2012-02-28 ENCOUNTER — Ambulatory Visit: Payer: Self-pay | Admitting: Otolaryngology

## 2012-02-28 LAB — CBC WITH DIFFERENTIAL/PLATELET
Basophil %: 1.1 %
Eosinophil %: 2.1 %
HCT: 37.1 % (ref 35.0–47.0)
Lymphocyte #: 2.1 10*3/uL (ref 1.0–3.6)
MCH: 29 pg (ref 26.0–34.0)
MCV: 85 fL (ref 80–100)
Monocyte #: 0.5 x10 3/mm (ref 0.2–0.9)
Monocyte %: 5.9 %
Neutrophil #: 5.2 10*3/uL (ref 1.4–6.5)
Platelet: 238 10*3/uL (ref 150–440)
RBC: 4.36 10*6/uL (ref 3.80–5.20)
RDW: 13.1 % (ref 11.5–14.5)
WBC: 8 10*3/uL (ref 3.6–11.0)

## 2012-02-28 LAB — SEDIMENTATION RATE: Erythrocyte Sed Rate: 47 mm/hr — ABNORMAL HIGH (ref 0–30)

## 2012-03-13 ENCOUNTER — Encounter: Payer: Self-pay | Admitting: Internal Medicine

## 2012-03-13 ENCOUNTER — Ambulatory Visit (INDEPENDENT_AMBULATORY_CARE_PROVIDER_SITE_OTHER): Payer: Medicare PPO | Admitting: Internal Medicine

## 2012-03-13 VITALS — BP 120/78 | HR 80 | Temp 97.8°F | Ht 64.0 in | Wt 191.0 lb

## 2012-03-13 DIAGNOSIS — F411 Generalized anxiety disorder: Secondary | ICD-10-CM

## 2012-03-13 DIAGNOSIS — Z85038 Personal history of other malignant neoplasm of large intestine: Secondary | ICD-10-CM | POA: Insufficient documentation

## 2012-03-13 DIAGNOSIS — F339 Major depressive disorder, recurrent, unspecified: Secondary | ICD-10-CM | POA: Insufficient documentation

## 2012-03-13 DIAGNOSIS — E7800 Pure hypercholesterolemia, unspecified: Secondary | ICD-10-CM

## 2012-03-13 DIAGNOSIS — F320 Major depressive disorder, single episode, mild: Secondary | ICD-10-CM | POA: Insufficient documentation

## 2012-03-13 DIAGNOSIS — I10 Essential (primary) hypertension: Secondary | ICD-10-CM | POA: Insufficient documentation

## 2012-03-13 DIAGNOSIS — C189 Malignant neoplasm of colon, unspecified: Secondary | ICD-10-CM

## 2012-03-13 DIAGNOSIS — K219 Gastro-esophageal reflux disease without esophagitis: Secondary | ICD-10-CM | POA: Insufficient documentation

## 2012-03-13 DIAGNOSIS — Z85118 Personal history of other malignant neoplasm of bronchus and lung: Secondary | ICD-10-CM | POA: Insufficient documentation

## 2012-03-13 DIAGNOSIS — F32A Depression, unspecified: Secondary | ICD-10-CM | POA: Insufficient documentation

## 2012-03-13 DIAGNOSIS — F329 Major depressive disorder, single episode, unspecified: Secondary | ICD-10-CM

## 2012-03-13 DIAGNOSIS — E785 Hyperlipidemia, unspecified: Secondary | ICD-10-CM

## 2012-03-13 DIAGNOSIS — N289 Disorder of kidney and ureter, unspecified: Secondary | ICD-10-CM

## 2012-03-13 DIAGNOSIS — F419 Anxiety disorder, unspecified: Secondary | ICD-10-CM

## 2012-03-13 DIAGNOSIS — C3490 Malignant neoplasm of unspecified part of unspecified bronchus or lung: Secondary | ICD-10-CM | POA: Insufficient documentation

## 2012-03-13 NOTE — Assessment & Plan Note (Signed)
Stable on prilosec

## 2012-03-13 NOTE — Assessment & Plan Note (Signed)
S/P lobectomy.  Sees Dr Choksi.  Breathing stable.     

## 2012-03-13 NOTE — Patient Instructions (Signed)
It was good seeing you today.  Your blood pressure is doing great.  I want you to try some vitamin E 400 IU once per day to see if this helps with the hot flashes.  Continue your other meds as you are doing.  I will get the records from Dr Andee Poles to review.  I do recommend you get updated on your Tetanus booster (Tdap).

## 2012-03-13 NOTE — Assessment & Plan Note (Signed)
Sees Dr Doylene Canning.  Obtain records to review.  States up to date with colonoscopy.

## 2012-03-13 NOTE — Assessment & Plan Note (Signed)
Will have her cholesterol checked with her next lab draw at Dr Aleda Grana.  Not taking any statin now.  Only uses the Cholestyramine prn now.  Low cholesterol diet and exercise.

## 2012-03-13 NOTE — Assessment & Plan Note (Signed)
Renal function recently checked through Dr Doylene Canning - stable.  Follow.  Stay hydrated.

## 2012-03-13 NOTE — Assessment & Plan Note (Signed)
Has been controlled with prn Xanax.  Some increase now with the planned CT in the near future.  Discussed at length with her today.  Hold on any further meds at this point.  Follow.

## 2012-03-13 NOTE — Assessment & Plan Note (Signed)
Blood pressure controlled on current regimen.  Same medication.  Recent met b through Dr Doylene Canning  - stable renal function.  Follow.  Potassium wnl.

## 2012-03-13 NOTE — Assessment & Plan Note (Signed)
Stable.  Follow.   

## 2012-03-13 NOTE — Progress Notes (Signed)
  Subjective:    Patient ID: Victoria Hanson, female    DOB: 04-09-1951, 61 y.o.   MRN: [id number]  HPI 61 year old female with past history of lung cancer (s/p lobectomy), colon cancer, hypertension and hypercholesterolemia.  She comes in today for a scheduled follow up.  She is experiencing some increased anxiety.  She sees Dr Doylene Canning for followup regarding her cancer.  She is planning to have a follow up CT scan in November.  She is anxious about this.  She uses xanax prn - which helps.  She also was previously experiencing some pain with her ears and temporal region of her head.  Saw Dr Andee Poles.  Had CT and labs.  He gave her Flexeril and Etodolac.  Unable to take Etodolac.  Had a tooth pulled and the pain is better now.  Was told she had some "inflammation in her blood".  No bad headache now.  No earache.  No sinus symptoms.    Past Medical History  Diagnosis Date  . Colon cancer 1999    Pt had partial colectomy. Did develope lung metastasis. Had right upper lobectomy in 01/2007 then had associated chemotherapy.  . Hypercholesterolemia   . Hypertension   . Depression   . Nephrolithiasis   . Elevated transaminase level     ? NASH  . GERD (gastroesophageal reflux disease)   . Holter monitor, abnormal 12/2009    NSR, rare PACs and PVCs  . Echocardiogram abnormal 02/2010    EF 55-60%, mild LAE, no regional wall motion abnormalities  . Lung cancer     S/P resection (lobectomy - right)    Review of Systems Patient denies any signficant headache currently.  No lightheadedness or dizziness.  No chest pain, tightness or palpatations. No increased shortness of breath, cough or congestion.  No nausea or vomiting.  No abdominal pain or cramping. No acid reflux.  No bowel change, such as diarrhea, constipation, BRBPR or melana.  No urine change.         Objective:   Physical Exam   Filed Vitals:   03/13/12 0813  BP: 120/78  Pulse: 80  Temp: 97.8 F (36.6 C)    61year old female in no  acute distress.   HEENT:  Nares - clear. TMs visualized - without erythema.  OP- without lesions or erythema.  NECK:  Supple, nontender.  No audible bruit.   HEART:  Appears to be regular. LUNGS:  Without crackles or wheezing audible.  Respirations even and unlabored.   RADIAL PULSE:  Equal bilaterally.  ABDOMEN:  Soft, nontender.  No audible abdominal bruit.   EXTREMITIES:  No increased edema to be present.                       Assessment & Plan:  1.  EAR PAIN/HEADACHE.  Saw Dr Andee Poles.  See above.  Obtain records.  Had her tooth pulled.  Feels better now.  Review records and confirm no further w/up warranted.   2.  ABNORMAL MAMMOGRAM.  Is followed by Dr Michela Pitcher.  He is scheduling her mammograms.  She will have him send records to me.   3.  HOT FLASHES.  Will try vitamin E 400 IU q day.  Follow.

## 2012-03-14 ENCOUNTER — Other Ambulatory Visit: Payer: Self-pay | Admitting: Internal Medicine

## 2012-03-14 MED ORDER — POTASSIUM CHLORIDE CRYS ER 20 MEQ PO TBCR: 20.0000 meq | EXTENDED_RELEASE_TABLET | Freq: Every day | ORAL | Status: DC

## 2012-03-20 ENCOUNTER — Telehealth: Payer: Self-pay | Admitting: Internal Medicine

## 2012-03-20 MED ORDER — ESCITALOPRAM OXALATE 10 MG PO TABS: 10.0000 mg | ORAL_TABLET | Freq: Every day | ORAL | Status: DC

## 2012-03-20 NOTE — Telephone Encounter (Signed)
She signed a form while she was here for release of information to get her recent notes - acute visits and labs with him.  I am not sure where the form would be - if it could be faxed.  Thanks.

## 2012-03-20 NOTE — Telephone Encounter (Signed)
Dr. Lauro Regulus Vaught's office said they haven't received the fax you were going to fax over and if you could re-fax it over if possible.

## 2012-03-20 NOTE — Telephone Encounter (Signed)
Rx for Lexapro sent to pharmacy electronically.

## 2012-03-20 NOTE — Telephone Encounter (Signed)
Pt called  Pt takes xanax and she wanted to see you would try her on lexapro [facility] [phone]

## 2012-03-20 NOTE — Telephone Encounter (Signed)
Discussed with pt regarding starting Lexapro.  Discussed proper way to take, etc.  Ok for rx for Lexapro 10mg  one q day #30 with one refill.  Thanks.

## 2012-03-21 NOTE — Telephone Encounter (Signed)
Medical records release form faxed to Dr. Andee Poles office again.

## 2012-04-23 ENCOUNTER — Telehealth: Payer: Self-pay | Admitting: Internal Medicine

## 2012-04-23 NOTE — Telephone Encounter (Signed)
Pt called wanting to see dr scott  Pt stated her bp is up  130/96.  2 night ago everytime her heart beat her chest hurt.  134/94 Sent pt to traige nurse

## 2012-04-23 NOTE — Telephone Encounter (Signed)
Pt is needing her Triamterene HCTz 37.5 -25 mg tablets

## 2012-04-23 NOTE — Telephone Encounter (Signed)
Spoke to patient.  She has not had any pain x 2 days.  Will come in tomorrow at 10:00 am and see me.  If any  Reoccurring pain tonight - will be evaluated.

## 2012-04-23 NOTE — Telephone Encounter (Signed)
i am ok to refill her triam/hctz, but see the previous note from robin.  If having chest pain - needs to be seen today.

## 2012-04-23 NOTE — Telephone Encounter (Signed)
Patient calling, her b/p had gone up earlier but she took 2 ASA and it has gone back down.  It was 136/96 but is now 122/78.   She had a similar situation 2 nights ago and also had chest pain at that time.  Denies any chest pain today.  Denies any shortness of breath, dizziness or lightheadedness.   She will call if she has any additional hypertension. To call 911 if she has any chest pain.   She asked for a visit with MD.  Ardelia Mems to schedule a Thursday appt, they are on Hold.  Please call in am and schedule an appt.

## 2012-04-24 ENCOUNTER — Encounter: Payer: Self-pay | Admitting: Internal Medicine

## 2012-04-24 ENCOUNTER — Ambulatory Visit (INDEPENDENT_AMBULATORY_CARE_PROVIDER_SITE_OTHER): Payer: Medicare PPO | Admitting: Internal Medicine

## 2012-04-24 VITALS — BP 112/60 | HR 69 | Temp 98.3°F | Ht 64.0 in | Wt 193.5 lb

## 2012-04-24 DIAGNOSIS — F419 Anxiety disorder, unspecified: Secondary | ICD-10-CM

## 2012-04-24 DIAGNOSIS — I10 Essential (primary) hypertension: Secondary | ICD-10-CM

## 2012-04-24 DIAGNOSIS — F411 Generalized anxiety disorder: Secondary | ICD-10-CM

## 2012-04-24 DIAGNOSIS — K219 Gastro-esophageal reflux disease without esophagitis: Secondary | ICD-10-CM

## 2012-04-24 DIAGNOSIS — R748 Abnormal levels of other serum enzymes: Secondary | ICD-10-CM

## 2012-04-24 DIAGNOSIS — E7800 Pure hypercholesterolemia, unspecified: Secondary | ICD-10-CM

## 2012-04-24 DIAGNOSIS — R079 Chest pain, unspecified: Secondary | ICD-10-CM

## 2012-04-24 DIAGNOSIS — R51 Headache: Secondary | ICD-10-CM

## 2012-04-24 LAB — LDL CHOLESTEROL, DIRECT: Direct LDL: 178.1 mg/dL

## 2012-04-24 LAB — BASIC METABOLIC PANEL
Chloride: 101 mEq/L (ref 96–112)
GFR: 47.85 mL/min — ABNORMAL LOW (ref >60.00)
Glucose, Bld: 104 mg/dL — ABNORMAL HIGH (ref 70–99)
Potassium: 4.4 mEq/L (ref 3.5–5.1)
Sodium: 138 mEq/L (ref 135–145)

## 2012-04-24 LAB — LIPID PANEL
Cholesterol: 259 mg/dL — ABNORMAL HIGH (ref 0–200)
HDL: 44.4 mg/dL (ref >39.00)
VLDL: 36.2 mg/dL (ref 0.0–40.0)

## 2012-04-24 LAB — HEPATIC FUNCTION PANEL
Albumin: 4 g/dL (ref 3.5–5.2)
Total Protein: 7.6 g/dL (ref 6.0–8.3)

## 2012-04-24 NOTE — Patient Instructions (Addendum)
It was nice seeing you today.  I am sorry you have been having problems. Taper the Lexapro as we discussed.  We will get you appt scheduled with Dr Mariah Milling.

## 2012-04-25 ENCOUNTER — Encounter: Payer: Self-pay | Admitting: Internal Medicine

## 2012-04-25 ENCOUNTER — Other Ambulatory Visit: Payer: Self-pay | Admitting: Internal Medicine

## 2012-04-25 MED ORDER — TRIAMTERENE-HCTZ 37.5-25 MG PO TABS: 1.0000 | ORAL_TABLET | Freq: Every day | ORAL | Status: DC

## 2012-04-25 NOTE — Telephone Encounter (Signed)
Left message on machine at home advising patient as instructed. 

## 2012-04-25 NOTE — Telephone Encounter (Signed)
[facility] she is saying she still has not got her b/p meds

## 2012-04-25 NOTE — Telephone Encounter (Signed)
Refill on Generic Maxide -25 37.5-25mg 

## 2012-04-25 NOTE — Telephone Encounter (Signed)
I sent prescription electronically to pharmacy.  Please notify pt has been sent in.

## 2012-04-25 NOTE — Progress Notes (Signed)
Subjective:    Patient ID: Victoria Hanson, female    DOB: 03-23-1951, 61 y.o.   MRN: [id number]  HPI 61 year old female with past history of colon cancer, lung cancer, hypertension, hypercholesterolemia and anxiety who comes in today as a work in with concerns regarding intermittent chest pain.  She started taking Lexapro a few weeks ago.  She feels this is helping her sleep.  Reports that two nights ago (while lying in bed), she had chest pain.  Woke her from sleep.  Took an aspirin and the pain resolved.  She has had some other intermittent episodes, with the last occurring - yesterday evening.  Had a sharp pain - she notices "with her heart beat".  Took aspirin and it stops the pain.  No sob. No cough or congestion.  Denies any pain currrently.    She also previously had been having issues with headache and pain.  Saw ENT and her dentist.  Had a tooth pulled and was placed on antibiotics.  With her ENT evaluation, she was told her Inflammation marker was elevated.  She has failed to follow up with ENT.  She states that since having the tooth pulled and being on abx- her headache has improved/resolved.    Past Medical History  Diagnosis Date  . Colon cancer 1999    Pt had partial colectomy. Did develope lung metastasis. Had right upper lobectomy in 01/2007 then had associated chemotherapy.  . Hypercholesterolemia   . Hypertension   . Depression   . Nephrolithiasis   . Elevated transaminase level     ? NASH  . GERD (gastroesophageal reflux disease)   . Holter monitor, abnormal 12/2009    NSR, rare PACs and PVCs  . Echocardiogram abnormal 02/2010    EF 55-60%, mild LAE, no regional wall motion abnormalities  . Lung cancer     S/P resection (lobectomy - right)    Review of Systems Patient denies any current headache.  No lightheadedness or dizziness.  No sinus or chest congestion.  Chest pain as outlined.   No increased shortness of breath, cough or congestion.  No nausea or vomiting.   No significant acid reflux.  On Prilosec and feels this is controlling her symptoms.  No abdominal pain or cramping.  No bowel change, such as diarrhea, constipation, BRBPR or melana.  No urine change.  Was not sleeping well.  Feels that the Lexapro has helped her sleep.  She also was questioning whether or not the Lexapro was causing the chest pain.  She wants to come off of the medication and see if symptoms resolve.      Objective:   Physical Exam Filed Vitals:   04/24/12 0950  BP: 112/60  Pulse: 69  Temp: 98.3 F (62.69 C)   61 year old female in no acute distress.   HEENT:  Nares - clear.  OP- without lesions or erythema.  No tenderness to palpation over the temporal artery region.   NECK:  Supple, nontender.  No bruit.   HEART:  Appears to be regular. LUNGS:  Without crackles or wheezing audible.  Respirations even and unlabored.  CHEST WALL:  No reproducible pain to palpation.    RADIAL PULSE:  Equal bilaterally.  ABDOMEN:  Soft, nontender.  No audible abdominal bruit.   EXTREMITIES:  No increased edema to be present.                    Assessment & Plan:  CARDIOVASCULAR.  Symptoms and exam as outlined.  Initial chest pain woke her from sleep.  Pain has been relieved with aspirin.  EKG obtained and revealed SR with ST depression in II, III and AVF and TWI and ST depression in v2-4.  When compared to previous - no acute changes.  Will refer to cardiology for further evaluation - given pain occurring at rest, etc.  Pt comfortable with this plan.  She knows if she has any reoccurring problems or changes she is to be reevaluated.    PREVIOUS HEADACHE.  Worked up by ENT and saw her dentist.  Had elevated inflammatory marker - per patient.  Headache improved now.  Not an issue for her currently.  Will recheck esr to confirm normalized.

## 2012-04-26 ENCOUNTER — Encounter: Payer: Self-pay | Admitting: Internal Medicine

## 2012-04-26 NOTE — Assessment & Plan Note (Signed)
Reports the reflux is controlled.  Continue prilosec.   

## 2012-04-26 NOTE — Assessment & Plan Note (Signed)
Wants to taper the Lexapro.  Is concerned that it may be contributing to some of her symptoms.  She has been sleeping better since starting.  Will taper - at her request.  I am not sure the Lexapro is contributing to her symptoms.  She has Xanax to take if needed.

## 2012-04-26 NOTE — Assessment & Plan Note (Signed)
Blood pressure under good control.  Same meds.  Check metabolic panel.  

## 2012-04-28 ENCOUNTER — Ambulatory Visit: Payer: Self-pay | Admitting: Oncology

## 2012-04-30 ENCOUNTER — Ambulatory Visit: Payer: Self-pay | Admitting: Oncology

## 2012-04-30 LAB — CBC CANCER CENTER
Basophil #: 0.1 x10 3/mm (ref 0.0–0.1)
Basophil %: 0.9 %
Eosinophil #: 0.1 x10 3/mm (ref 0.0–0.7)
Eosinophil %: 1.4 %
HCT: 37.1 % (ref 35.0–47.0)
HGB: 11.7 g/dL — ABNORMAL LOW (ref 12.0–16.0)
Lymphocyte #: 1.8 x10 3/mm (ref 1.0–3.6)
Lymphocyte %: 24.2 %
MCH: 27.6 pg (ref 26.0–34.0)
MCHC: 31.6 g/dL — ABNORMAL LOW (ref 32.0–36.0)
MCV: 87 fL (ref 80–100)
Monocyte #: 0.5 x10 3/mm (ref 0.2–0.9)
Monocyte %: 5.9 %
Neutrophil #: 5.2 x10 3/mm (ref 1.4–6.5)
Neutrophil %: 67.6 %
Platelet: 231 x10 3/mm (ref 150–440)
RBC: 4.26 10*6/uL (ref 3.80–5.20)
RDW: 13.2 % (ref 11.5–14.5)
WBC: 7.6 x10 3/mm (ref 3.6–11.0)

## 2012-04-30 LAB — COMPREHENSIVE METABOLIC PANEL
Albumin: 3.5 g/dL (ref 3.4–5.0)
Alkaline Phosphatase: 116 U/L (ref 50–136)
Anion Gap: 8 (ref 7–16)
BUN: 13 mg/dL (ref 7–18)
Bilirubin,Total: 0.3 mg/dL (ref 0.2–1.0)
Calcium, Total: 9.4 mg/dL (ref 8.5–10.1)
Chloride: 102 mmol/L (ref 98–107)
Glucose: 97 mg/dL (ref 65–99)
Osmolality: 279 (ref 275–301)
Potassium: 3.8 mmol/L (ref 3.5–5.1)
SGOT(AST): 41 U/L — ABNORMAL HIGH (ref 15–37)
Sodium: 140 mmol/L (ref 136–145)
Total Protein: 7.6 g/dL (ref 6.4–8.2)

## 2012-05-03 ENCOUNTER — Telehealth: Payer: Self-pay | Admitting: Internal Medicine

## 2012-05-03 DIAGNOSIS — R51 Headache: Secondary | ICD-10-CM

## 2012-05-03 MED ORDER — COLESEVELAM HCL 625 MG PO TABS: ORAL_TABLET | ORAL | Status: DC

## 2012-05-03 NOTE — Telephone Encounter (Signed)
Pt notified of labs.  Increased cholesterol.  Had intolerance and elevated LFTs to statin meds.  Will try welchol to help with cholesterol.  [facility].  States she still intermittently will have some tooth and jaw discomfort.  Previous pain was improved with abx and when she had her tooth pulled.  Did not follow back up with ent for the inflammation.  Esr still elevated.  Will recheck.  I have placed order for her labs.  She wants to come in 05/07/12 at 3:00 to [facility].  Can you add to their lab schedule.  Thanks.

## 2012-05-05 NOTE — Telephone Encounter (Signed)
Added to schedule.

## 2012-05-07 ENCOUNTER — Encounter: Payer: Self-pay | Admitting: Cardiovascular Disease

## 2012-05-07 ENCOUNTER — Other Ambulatory Visit: Payer: Medicare PPO

## 2012-05-07 ENCOUNTER — Ambulatory Visit (INDEPENDENT_AMBULATORY_CARE_PROVIDER_SITE_OTHER): Payer: Medicare PPO | Admitting: Cardiovascular Disease

## 2012-05-07 VITALS — BP 140/80 | HR 80 | Ht 64.0 in | Wt 197.8 lb

## 2012-05-07 DIAGNOSIS — E785 Hyperlipidemia, unspecified: Secondary | ICD-10-CM

## 2012-05-07 DIAGNOSIS — R51 Headache: Secondary | ICD-10-CM

## 2012-05-07 DIAGNOSIS — I10 Essential (primary) hypertension: Secondary | ICD-10-CM

## 2012-05-07 DIAGNOSIS — R079 Chest pain, unspecified: Secondary | ICD-10-CM | POA: Insufficient documentation

## 2012-05-07 NOTE — Assessment & Plan Note (Signed)
Blood pressure is well controlled on today's visit. No changes made to the medications. 

## 2012-05-07 NOTE — Assessment & Plan Note (Signed)
Improved symptoms with lower dose Lexapro. She did notice a big improvement in symptoms from 10 mg to 5 mg dose. She is interested in weaning off the medication altogether. I suggested she discuss this with Dr. Lorin Picket. No problems with exertion, no reproducible chest pain. We did discuss possibly doing a treadmill stress test. She feels the symptoms were from Lexapro. We have suggested with the long weekend coming up, that we watch her symptoms for now and touch base next week. If she continues to have stuttering chest discomfort, we will recommend possibly routine treadmill study. If she is unable to exercise, pharmacologic study could be done.

## 2012-05-07 NOTE — Assessment & Plan Note (Signed)
Cholesterol is elevated. She is scheduled to start WelChol twice a day.  We have encouraged continued exercise, careful diet management in an effort to lose weight.

## 2012-05-07 NOTE — Progress Notes (Signed)
Patient ID: Victoria Hanson, female    DOB: 1950/11/09, 61 y.o.   MRN: [id number]  HPI Comments: 61 yo with prior history of atypical chest pain, HTN, and hyperlipidemia, shortness of breath, presents for routine followup and for recent episodes of sharp chest pain. She does have a history of colon cancer, metastasis to the lung, chemotherapy for 6 months, right lobectomy. Nonsmoker, no diabetes   Patient went to the ER in 7/11 with severe dyspnea.  This was thought to be a panic attack. Previously had frequent episodes where she feels like she can't breathe followed by tingling in the arms and legs.  hyperventilating during these episodes.  Episodes are triggered by events that provoke anxiety (going to the doctor, etc).  Sometimes an episode can be triggered by just going outside.  Xanax will help resolve these episodes.   Simvastatin held due to elevated LFTs.      ETT-myoview in 2011 which showed a small mid anterolateral fixed defect that was likely soft tissue attenuation.  No evidence for ischemia or infarction.    Echo showed preserved LV systolic function. holter monitor at [facility] which showed some PACs and PVCs.    She reports that she was started on Lexapro recently. Prior to starting this medication, she had no chest discomfort. On a 10 mg dose, she started developing sharp chest pain like a knife going into her chest that would come on at rest. No symptoms with exertion. She reports that she has otherwise been very active, raking leaves and doing heavy housework with no problems. She recently decreased the Lexapro dose to 5 mg and her chest pain symptoms have significantly resolved. She does have rare episodes of sharp chest pain that lasts for 1 second, typically coming on at rest.   EKG shows normal sinus rhythm with rate 69 beats per minute, nonspecific ST abnormality diffusely, unchanged from prior EKG in 2012    Outpatient Encounter Prescriptions as of 05/07/2012  Medication Sig  Dispense Refill  . ALPRAZolam (XANAX) 0.5 MG tablet Take 0.5 mg by mouth at bedtime as needed.       . Cholecalciferol (VITAMIN D3) 400 UNITS CAPS Take by mouth. Take two tablets daily       . colesevelam (WELCHOL) 625 MG tablet Take 3 tablets bid  180 tablet  3  . escitalopram (LEXAPRO) 10 MG tablet Take 5 mg by mouth daily.      Marland Kitchen omeprazole (PRILOSEC OTC) 20 MG tablet Take 20 mg by mouth daily.        . potassium chloride SA (K-DUR,KLOR-CON) 20 MEQ tablet Take 1 tablet (20 mEq total) by mouth daily.  30 tablet  5  . triamterene-hydrochlorothiazide (MAXZIDE-25) 37.5-25 MG per tablet Take 1 each (1 tablet total) by mouth daily.  30 tablet  4  . vitamin E 400 UNIT capsule Take 400 Units by mouth daily.       Review of Systems  Constitutional: Negative.   HENT: Negative.   Eyes: Negative.   Respiratory: Negative.   Cardiovascular: Positive for chest pain.  Gastrointestinal: Negative.   Musculoskeletal: Negative.   Skin: Negative.   Neurological: Negative.   Hematological: Negative.   Psychiatric/Behavioral: Negative.   All other systems reviewed and are negative.    BP 140/80  Pulse 80  Ht 5\' 4"  (1.626 m)  Wt 197 lb 12 oz (89.699 kg)  BMI 33.94 kg/m2  Physical Exam  Nursing note and vitals reviewed. Constitutional: She is oriented to person,  place, and time. She appears well-developed and well-nourished.  HENT:  Head: Normocephalic.  Nose: Nose normal.  Mouth/Throat: Oropharynx is clear and moist.  Eyes: Conjunctivae normal are normal. Pupils are equal, round, and reactive to light.  Neck: Normal range of motion. Neck supple. No JVD present.  Cardiovascular: Normal rate, regular rhythm, S1 normal, S2 normal, normal heart sounds and intact distal pulses.  Exam reveals no gallop and no friction rub.   No murmur heard. Pulmonary/Chest: Effort normal and breath sounds normal. No respiratory distress. She has no wheezes. She has no rales. She exhibits no tenderness.  Abdominal:  Soft. Bowel sounds are normal. She exhibits no distension. There is no tenderness.  Musculoskeletal: Normal range of motion. She exhibits no edema and no tenderness.  Lymphadenopathy:    She has no cervical adenopathy.  Neurological: She is alert and oriented to person, place, and time. Coordination normal.  Skin: Skin is warm and dry. No rash noted. No erythema.  Psychiatric: She has a normal mood and affect. Her behavior is normal. Judgment and thought content normal.         Assessment and Plan

## 2012-05-07 NOTE — Patient Instructions (Addendum)
You are doing well. No medication changes were made.  We will call you next week to see how your chest pain is going  Please call us if you have new issues that need to be addressed before your next appt.  Your physician wants you to follow-up in: 6 months.  You will receive a reminder letter in the mail two months in advance. If you don't receive a letter, please call our office to schedule the follow-up appointment.

## 2012-05-09 LAB — C-REACTIVE PROTEIN: CRP: 5.3 mg/L — ABNORMAL HIGH (ref 0.0–4.9)

## 2012-05-11 ENCOUNTER — Ambulatory Visit: Payer: Self-pay | Admitting: Oncology

## 2012-05-19 ENCOUNTER — Telehealth: Payer: Self-pay | Admitting: Internal Medicine

## 2012-05-19 NOTE — Telephone Encounter (Signed)
Refill Lexapro 10 mg. Patient has not heard from her blood work that was done at [facility] to see if she has inflammation . She informed that Dr. Lorin Picket was to give her a call .

## 2012-05-22 MED ORDER — ESCITALOPRAM OXALATE 10 MG PO TABS: 5.0000 mg | ORAL_TABLET | Freq: Every day | ORAL | Status: DC

## 2012-05-22 NOTE — Telephone Encounter (Signed)
Sent in to pharmacy.  

## 2012-06-16 ENCOUNTER — Encounter: Payer: Self-pay | Admitting: Internal Medicine

## 2012-06-16 ENCOUNTER — Ambulatory Visit (INDEPENDENT_AMBULATORY_CARE_PROVIDER_SITE_OTHER): Payer: Medicare PPO | Admitting: Internal Medicine

## 2012-06-16 VITALS — BP 124/80 | HR 70 | Temp 98.6°F | Ht 64.0 in | Wt 197.0 lb

## 2012-06-16 DIAGNOSIS — F411 Generalized anxiety disorder: Secondary | ICD-10-CM

## 2012-06-16 DIAGNOSIS — F419 Anxiety disorder, unspecified: Secondary | ICD-10-CM

## 2012-06-16 DIAGNOSIS — I10 Essential (primary) hypertension: Secondary | ICD-10-CM

## 2012-06-16 MED ORDER — ESCITALOPRAM OXALATE 10 MG PO TABS: 10.0000 mg | ORAL_TABLET | Freq: Every day | ORAL | Status: DC

## 2012-06-16 MED ORDER — FLUTICASONE PROPIONATE 50 MCG/ACT NA SUSP: 2.0000 | Freq: Every day | NASAL | Status: DC

## 2012-06-16 MED ORDER — AMOXICILLIN 875 MG PO TABS: 875.0000 mg | ORAL_TABLET | Freq: Two times a day (BID) | ORAL | Status: DC

## 2012-06-18 ENCOUNTER — Ambulatory Visit: Payer: Medicare PPO | Admitting: Internal Medicine

## 2012-06-20 ENCOUNTER — Encounter: Payer: Self-pay | Admitting: Internal Medicine

## 2012-06-20 DIAGNOSIS — C189 Malignant neoplasm of colon, unspecified: Secondary | ICD-10-CM

## 2012-06-20 DIAGNOSIS — N289 Disorder of kidney and ureter, unspecified: Secondary | ICD-10-CM

## 2012-06-22 ENCOUNTER — Encounter: Payer: Self-pay | Admitting: Internal Medicine

## 2012-06-22 NOTE — Assessment & Plan Note (Signed)
Wants to increase her Lexapro back up. She is no longer having chest pain.  Feels it is helping.  Follow.

## 2012-06-22 NOTE — Assessment & Plan Note (Signed)
Blood pressure under good control.  Same medication regimen.  Follow.  Follow metabolic panel.     

## 2012-06-22 NOTE — Progress Notes (Signed)
Subjective:    Patient ID: Victoria Hanson, female    DOB: [birth year], 62 y.o.   MRN: [id number]  HPI 62 year old female with past history of colon cancer, lung cancer (s/p lobectomy), hypertension and hypercholesterolemia who comes in today as a work in with concerns regarding a sinus infection.  States symptoms started over a week ago.  Increased sinus pressure and sore throat.  Green and blood tinged mucus.  Feels like her typical sinus infection.  Has been taking ibuprofen and tylenol.  No chest congestion.  No nausea or vomiting.  The previous headache - resolved.    Past Medical History  Diagnosis Date  . Colon cancer 1999    Pt had partial colectomy. Did develop lung metastasis. Had right upper lobectomy in 01/2007 then had associated chemotherapy.  . Hypercholesterolemia   . Hypertension   . Depression   . Nephrolithiasis   . Elevated transaminase level     ? NASH  . GERD (gastroesophageal reflux disease)   . Holter monitor, abnormal 12/2009    NSR, rare PACs and PVCs  . Echocardiogram abnormal 02/2010    EF 55-60%, mild LAE, no regional wall motion abnormalities  . Lung cancer     S/P resection (lobectomy - right)  . COPD (chronic obstructive pulmonary disease)     Current Outpatient Prescriptions on File Prior to Visit  Medication Sig Dispense Refill  . ALPRAZolam (XANAX) 0.5 MG tablet Take 0.5 mg by mouth at bedtime as needed.       . Cholecalciferol (VITAMIN D3) 400 UNITS CAPS Take by mouth. Take two tablets daily       . colesevelam (WELCHOL) 625 MG tablet Take 3 tablets bid  180 tablet  3  . escitalopram (LEXAPRO) 10 MG tablet Take 1 tablet (10 mg total) by mouth daily.  30 tablet  2  . omeprazole (PRILOSEC OTC) 20 MG tablet Take 20 mg by mouth daily.        . potassium chloride SA (K-DUR,KLOR-CON) 20 MEQ tablet Take 1 tablet (20 mEq total) by mouth daily.  30 tablet  5  . triamterene-hydrochlorothiazide (MAXZIDE-25) 37.5-25 MG per tablet Take 1 each (1 tablet total)  by mouth daily.  30 tablet  4  . vitamin E 400 UNIT capsule Take 400 Units by mouth daily.      . fluticasone (FLONASE) 50 MCG/ACT nasal spray Place 2 sprays into the nose daily.  16 g  0    Review of Systems Patient denies any headache, lightheadedness or dizziness.  Does report the sinus pressure and congestion as outlined above.   No chest pain, tightness or palpitations.  No increased shortness of breath, cough or congestion.  No nausea or vomiting.  No abdominal pain or cramping.  No bowel change, such as diarrhea, constipation, BRBPR or melana.  No urine change.        Objective:   Physical Exam Filed Vitals:   06/16/12 1636  BP: 124/80  Pulse: 70  Temp: 98.6 F (21 C)   62 year old female in no acute distress.   HEENT:  Nares - erythematous turbinates.   OP- without lesions or erythema.  TMs visualized without erythema.  Minimal tenderness to palpation over the sinuses - maxillary.   NECK:  Supple, nontender.   HEART:  Appears to be regular. LUNGS:  Without crackles or wheezing audible.  Respirations even and unlabored.       Assessment & Plan:  PROBABLE SINUSITIS.  Will go  ahead and treat with Amoxicillin 875mg  bid x 10 days.  Flonase nasal spray and saline nasal spray as directed.  Follow.  Let me know if persistent problems.    PREVIOUS HEADACHE.  Headache resolved.  Follow.

## 2012-07-10 ENCOUNTER — Ambulatory Visit: Payer: Self-pay | Admitting: Surgery

## 2012-07-21 ENCOUNTER — Ambulatory Visit: Payer: Self-pay | Admitting: Surgery

## 2012-07-28 ENCOUNTER — Ambulatory Visit: Payer: Medicare PPO | Admitting: Internal Medicine

## 2012-08-08 ENCOUNTER — Telehealth: Payer: Self-pay | Admitting: *Deleted

## 2012-08-08 NOTE — Telephone Encounter (Signed)
Needs refill on Prilosec 20 mg cap 1 daily #30. [facility].

## 2012-08-09 ENCOUNTER — Telehealth: Payer: Self-pay | Admitting: Internal Medicine

## 2012-08-09 MED ORDER — OMEPRAZOLE MAGNESIUM 20 MG PO TBEC: 20.0000 mg | DELAYED_RELEASE_TABLET | Freq: Every day | ORAL | Status: DC

## 2012-08-09 NOTE — Telephone Encounter (Signed)
Refilled omeprazole.

## 2012-08-15 ENCOUNTER — Ambulatory Visit: Payer: Medicare PPO | Admitting: Internal Medicine

## 2012-08-15 IMAGING — PT NM PET TUM IMG RESTAG (PS) SKULL BASE T - THIGH
1 of 5 series · 1 of 25 positions shown · non-contrast
Comparison: Prior PET/CT of 10/30/2007.

REASON FOR EXAM: colon CA with mets to lung status post resection
restaging
COMMENTS:

PROCEDURE:     PET - PET/CT COLORECTAL CA RESTG  - December 21, 2010  [DATE]
RESULT:
HISTORY: Colon cancer.

[Series 3: ct wb 3.0 b30f · axial · 3.0mm · 0.98mm/px · 1 of 435 slices shown]
[im 435/435  brain]
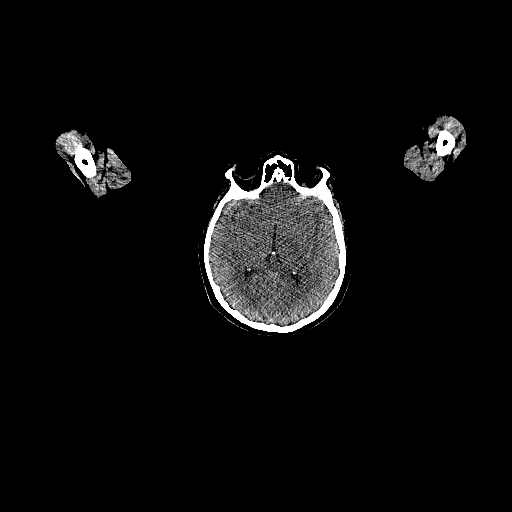

[1 of 25 positions shown; findings below may reference images not displayed]

FINDINGS: Standard PET/CT is obtained following determination of fasting
blood sugar of 95 mg/dl and administration of 12.9 mCi of F18-FDG. CT was
obtained for attenuation correction and fusion. Evaluation on Syngo via was
performed.

Again noted is prominent bowel activity. Stable bowel activity including
anorectal activity is noted. Muscular activity is noted in the neck. No
evidence of metastatic disease.
IMPRESSION: No evidence of metastatic disease.

## 2012-09-02 ENCOUNTER — Other Ambulatory Visit: Payer: Self-pay | Admitting: Internal Medicine

## 2012-09-03 NOTE — Telephone Encounter (Signed)
Rx sent in to pharmacy. 

## 2012-09-10 ENCOUNTER — Ambulatory Visit (INDEPENDENT_AMBULATORY_CARE_PROVIDER_SITE_OTHER): Payer: Medicare PPO | Admitting: Internal Medicine

## 2012-09-10 ENCOUNTER — Encounter: Payer: Self-pay | Admitting: Internal Medicine

## 2012-09-10 ENCOUNTER — Other Ambulatory Visit: Payer: Self-pay | Admitting: Internal Medicine

## 2012-09-10 VITALS — BP 120/70 | HR 62 | Temp 97.9°F | Ht 64.0 in | Wt 191.8 lb

## 2012-09-10 DIAGNOSIS — E785 Hyperlipidemia, unspecified: Secondary | ICD-10-CM

## 2012-09-10 DIAGNOSIS — F329 Major depressive disorder, single episode, unspecified: Secondary | ICD-10-CM

## 2012-09-10 DIAGNOSIS — N289 Disorder of kidney and ureter, unspecified: Secondary | ICD-10-CM

## 2012-09-10 DIAGNOSIS — R300 Dysuria: Secondary | ICD-10-CM

## 2012-09-10 DIAGNOSIS — F3289 Other specified depressive episodes: Secondary | ICD-10-CM

## 2012-09-10 DIAGNOSIS — C189 Malignant neoplasm of colon, unspecified: Secondary | ICD-10-CM

## 2012-09-10 DIAGNOSIS — K219 Gastro-esophageal reflux disease without esophagitis: Secondary | ICD-10-CM

## 2012-09-10 DIAGNOSIS — F411 Generalized anxiety disorder: Secondary | ICD-10-CM

## 2012-09-10 DIAGNOSIS — F419 Anxiety disorder, unspecified: Secondary | ICD-10-CM

## 2012-09-10 DIAGNOSIS — I10 Essential (primary) hypertension: Secondary | ICD-10-CM

## 2012-09-10 LAB — URINALYSIS, ROUTINE W REFLEX MICROSCOPIC
Nitrite: NEGATIVE
Urine Glucose: NEGATIVE
Urobilinogen, UA: 0.2 (ref 0.0–1.0)

## 2012-09-10 LAB — HEPATIC FUNCTION PANEL
ALT: 16 U/L (ref 0–35)
AST: 21 U/L (ref 0–37)
Albumin: 3.8 g/dL (ref 3.5–5.2)
Alkaline Phosphatase: 99 U/L (ref 39–117)
Bilirubin, Direct: 0.1 mg/dL (ref 0.0–0.3)
Total Bilirubin: 0.4 mg/dL (ref 0.3–1.2)
Total Protein: 7.2 g/dL (ref 6.0–8.3)

## 2012-09-10 LAB — BASIC METABOLIC PANEL
Calcium: 9.4 mg/dL (ref 8.4–10.5)
Creatinine, Ser: 1.4 mg/dL — ABNORMAL HIGH (ref 0.4–1.2)
GFR: 40.39 mL/min — ABNORMAL LOW (ref >60.00)
Sodium: 138 mEq/L (ref 135–145)

## 2012-09-10 LAB — LDL CHOLESTEROL, DIRECT: Direct LDL: 163.3 mg/dL

## 2012-09-10 LAB — LIPID PANEL
Cholesterol: 235 mg/dL — ABNORMAL HIGH (ref 0–200)
Triglycerides: 133 mg/dL (ref 0.0–149.0)

## 2012-09-10 MED ORDER — ALBUTEROL SULFATE HFA 108 (90 BASE) MCG/ACT IN AERS: 2.0000 | INHALATION_SPRAY | Freq: Four times a day (QID) | RESPIRATORY_TRACT | Status: DC | PRN

## 2012-09-10 MED ORDER — ALPRAZOLAM 0.5 MG PO TABS: 0.5000 mg | ORAL_TABLET | Freq: Every evening | ORAL | Status: DC | PRN

## 2012-09-10 NOTE — Assessment & Plan Note (Signed)
Blood pressure under good control.  Same medication regimen.  Follow.  Follow metabolic panel.     

## 2012-09-10 NOTE — Progress Notes (Signed)
Subjective:    Patient ID: Victoria Hanson, female    DOB: 12-Oct-1950, 62 y.o.   MRN: [id number]  HPI 62 year old female with past history of colon cancer, lung cancer (s/p lobectomy), hypertension and hypercholesterolemia who comes in today for a scheduled follow up.  She is no longer having headaches.  No sinus issues.  No chest pain or tightness.  Breathing stable.  Request to have ProAir refill - just if needed.  Still using xanax.  This works well for her.  She is also on Lexapro and tolerating. Appears to be doing better.  No nausea or vomiting.  No acid reflux.  She was previously having problems with diarrhea.  Was started on Wellchol.  Has only been taking it intermittently.  Now off.  Is having some issues with bowel change.  Will alternate between diarrhea and constipation.  She does not relate this to [facility].  Discussed GI referral.  She would like to see them and talk with them about the bowel change, but wants to wait until she gets some of her other bills paid.  She did see Dr Michela Pitcher.  Had a mammogram and follow up mammogram and ultrasound.  He evaluated and felt no further w/up warranted at this time (per patient).  Blood pressure has been fluctuating.  Taking her Triam/HCTZ regularly.      Past Medical History  Diagnosis Date  . Colon cancer 1999    Pt had partial colectomy. Did develop lung metastasis. Had right upper lobectomy in 01/2007 then had associated chemotherapy.  . Hypercholesterolemia   . Hypertension   . Depression   . Nephrolithiasis   . Elevated transaminase level     ? NASH  . GERD (gastroesophageal reflux disease)   . Holter monitor, abnormal 12/2009    NSR, rare PACs and PVCs  . Echocardiogram abnormal 02/2010    EF 55-60%, mild LAE, no regional wall motion abnormalities  . Lung cancer     S/P resection (lobectomy - right)  . COPD (chronic obstructive pulmonary disease)     Current Outpatient Prescriptions on File Prior to Visit  Medication Sig Dispense  Refill  . Cholecalciferol (VITAMIN D3) 400 UNITS CAPS Take by mouth. Take two tablets daily       . colesevelam (WELCHOL) 625 MG tablet Take 3 tablets bid  180 tablet  3  . escitalopram (LEXAPRO) 10 MG tablet Take 1 tablet (10 mg total) by mouth daily.  30 tablet  2  . fluticasone (FLONASE) 50 MCG/ACT nasal spray Place 2 sprays into the nose daily.  16 g  0  . KLOR-CON M20 20 MEQ tablet TAKE ONE (1) TABLET EACH DAY  30 tablet  5  . omeprazole (PRILOSEC OTC) 20 MG tablet Take 1 tablet (20 mg total) by mouth daily.  30 tablet  5  . triamterene-hydrochlorothiazide (MAXZIDE-25) 37.5-25 MG per tablet TAKE ONE (1) TABLET EACH DAY  30 tablet  5  . amoxicillin (AMOXIL) 875 MG tablet Take 1 tablet (875 mg total) by mouth 2 (two) times daily.  20 tablet  0  . vitamin E 400 UNIT capsule Take 400 Units by mouth daily.       No current facility-administered medications on file prior to visit.    Review of Systems Patient denies any headache, lightheadedness or dizziness.  No sinus or allergy symptoms.   No chest pain, tightness or palpitations.  No increased shortness of breath, cough or congestion.  No acid reflux.  No  nausea or vomiting.  No abdominal pain or cramping.  Bowel change as outlined.  No BRBPR or melana.  She does report some increased urinary frequency.  Some odor.  Request urine check.      Objective:   Physical Exam  Filed Vitals:   09/10/12 1055  BP: 120/70  Pulse: 62  Temp: 97.9 F (38.54 C)   62 year old female in no acute distress.   HEENT:  Nares- clear.  Oropharynx - without lesions. NECK:  Supple.  Nontender.  No audible bruit.  HEART:  Appears to be regular. LUNGS:  No crackles or wheezing audible.  Respirations even and unlabored.  RADIAL PULSE:  Equal bilaterally.   ABDOMEN:  Soft, nontender.  Bowel sounds present and normal.  No audible abdominal bruit.   EXTREMITIES:  No increased edema present.  DP pulses palpable and equal bilaterally.      SKIN:  No rash.       Assessment & Plan:  PREVIOUS HEADACHE.  Headache resolved.  Follow.    POSSIBLE UTI.  Symptoms as outlined.  Check urinalysis.    PULMONARY.  Breathing stable.  Refilled ProAir if needed.  Follow.    ABNORMAL MAMMOGRAM.  Saw Dr Michela Pitcher.  Had mammogram and follow up mammogram and ultrasound.  Per pt, he felt no further w/up warranted at this time.  Follow.    HEALTH MAINTENANCE.  Schedule her for a physical next visit.  Mammogram as outlined.  Last colonoscopy 2012.  Wants to hold on referral to GI as outlined.

## 2012-09-10 NOTE — Progress Notes (Signed)
Sent refill on ProAir

## 2012-09-10 NOTE — Assessment & Plan Note (Signed)
Reports the reflux is controlled.  Continue prilosec.   

## 2012-09-10 NOTE — Assessment & Plan Note (Signed)
Renal function stable.  Follow.  Stay hydrated.

## 2012-09-10 NOTE — Assessment & Plan Note (Signed)
S/P lobectomy.  Sees Dr Doylene Canning.  Breathing stable.

## 2012-09-10 NOTE — Assessment & Plan Note (Addendum)
On lexapro.   Feels it is helping.  Follow.  Uses xanax.

## 2012-09-10 NOTE — Assessment & Plan Note (Signed)
Not taking any statin now.  Not on Welchol now.   Low cholesterol diet and exercise.  Check lipid panel with next fasting labs.

## 2012-09-10 NOTE — Assessment & Plan Note (Signed)
Sees Dr Doylene Canning.  Obtain records to review.  States up to date with colonoscopy.  Bowel change as outlined.  We discussed adding fiber to help regulate her bowels.  Wants to hold on referral.

## 2012-09-10 NOTE — Assessment & Plan Note (Signed)
Stable.  Follow.  On Lexapro.

## 2012-09-11 ENCOUNTER — Telehealth: Payer: Self-pay | Admitting: Internal Medicine

## 2012-09-11 ENCOUNTER — Ambulatory Visit: Payer: Medicare PPO

## 2012-09-11 DIAGNOSIS — R300 Dysuria: Secondary | ICD-10-CM

## 2012-09-11 NOTE — Telephone Encounter (Signed)
Noted. Let me know if they cannot.

## 2012-09-11 NOTE — Telephone Encounter (Signed)
I need to make sure a urine culture was added or done.  If not, can this be added.  Let me know if not and I will need to get her back in for culture.  Thanks.

## 2012-09-11 NOTE — Telephone Encounter (Signed)
The patients urine sample was sent to the [facility] lab, i will see if they can do a urine

## 2012-09-12 ENCOUNTER — Ambulatory Visit: Payer: Medicare PPO | Admitting: Internal Medicine

## 2012-09-14 ENCOUNTER — Telehealth: Payer: Self-pay | Admitting: Internal Medicine

## 2012-09-14 DIAGNOSIS — N289 Disorder of kidney and ureter, unspecified: Secondary | ICD-10-CM

## 2012-09-14 DIAGNOSIS — N390 Urinary tract infection, site not specified: Secondary | ICD-10-CM

## 2012-09-14 LAB — URINE CULTURE

## 2012-09-14 MED ORDER — CIPROFLOXACIN HCL 500 MG PO TABS: 500.0000 mg | ORAL_TABLET | Freq: Two times a day (BID) | ORAL | Status: DC

## 2012-09-14 NOTE — Telephone Encounter (Signed)
Pt notified of lab results and positive urine culture.  Will treat with cipro.  abx sent in to [facility].  Will repeat urine to confirm cleared.

## 2012-09-14 NOTE — Telephone Encounter (Signed)
Pt notified of labs and need for abx and to stay hydrated.  Is planning to return for follow up labs on 09/26/12 at 9:30.  Please put on lab schedule.  Pt aware of appt.

## 2012-09-17 ENCOUNTER — Telehealth: Payer: Self-pay | Admitting: Internal Medicine

## 2012-09-17 MED ORDER — AMOXICILLIN-POT CLAVULANATE 500-125 MG PO TABS: 1.0000 | ORAL_TABLET | Freq: Two times a day (BID) | ORAL | Status: DC

## 2012-09-17 NOTE — Telephone Encounter (Signed)
Spoke to pt.  She developed a headache yesterday.  Felt sick on her stomach.  Not feeling that way today.  Will try augmentin 500bid.  rx sent in to [facility] (for 5 days)

## 2012-09-17 NOTE — Telephone Encounter (Signed)
Patient sick on stomach from Cipro wanting something else. She was transferred to the triage nurse . She informed me she could not get anyone and she called back.

## 2012-09-29 ENCOUNTER — Other Ambulatory Visit (INDEPENDENT_AMBULATORY_CARE_PROVIDER_SITE_OTHER): Payer: Medicare PPO

## 2012-09-29 DIAGNOSIS — I10 Essential (primary) hypertension: Secondary | ICD-10-CM

## 2012-09-29 DIAGNOSIS — N289 Disorder of kidney and ureter, unspecified: Secondary | ICD-10-CM

## 2012-09-29 DIAGNOSIS — N390 Urinary tract infection, site not specified: Secondary | ICD-10-CM

## 2012-09-29 LAB — BASIC METABOLIC PANEL
BUN: 19 mg/dL (ref 6–23)
CO2: 28 mEq/L (ref 19–32)
Calcium: 9.2 mg/dL (ref 8.4–10.5)
Chloride: 102 mEq/L (ref 96–112)
Creatinine, Ser: 1.4 mg/dL — ABNORMAL HIGH (ref 0.4–1.2)
Glucose, Bld: 90 mg/dL (ref 70–99)

## 2012-09-29 LAB — URINALYSIS, ROUTINE W REFLEX MICROSCOPIC
Bilirubin Urine: NEGATIVE
Ketones, ur: NEGATIVE
Total Protein, Urine: NEGATIVE
Urine Glucose: NEGATIVE
Urobilinogen, UA: 0.2 (ref 0.0–1.0)

## 2012-09-30 ENCOUNTER — Other Ambulatory Visit: Payer: Self-pay | Admitting: Internal Medicine

## 2012-09-30 DIAGNOSIS — N289 Disorder of kidney and ureter, unspecified: Secondary | ICD-10-CM

## 2012-09-30 DIAGNOSIS — E876 Hypokalemia: Secondary | ICD-10-CM

## 2012-09-30 LAB — CULTURE, URINE COMPREHENSIVE
Colony Count: NO GROWTH
Organism ID, Bacteria: NO GROWTH

## 2012-09-30 NOTE — Progress Notes (Signed)
Order placed for follow up lab.  

## 2012-10-02 ENCOUNTER — Telehealth: Payer: Self-pay | Admitting: Internal Medicine

## 2012-10-02 NOTE — Telephone Encounter (Signed)
This is just one reading and it was just slightly elevated.  I would recommend still continuing on 1/2 triam/hctz as instructed.  Stay hydrated.  If any problems, let us know.

## 2012-10-02 NOTE — Telephone Encounter (Signed)
Patient called regarding  BP 150/93.  Attempted to return call to patient twice with no answer.  Left voice mail to return call.

## 2012-10-02 NOTE — Telephone Encounter (Signed)
Pt notified of recommendations

## 2012-10-02 NOTE — Telephone Encounter (Signed)
Spoke with pt, B/P this morning was 150/93. Denies any chest pain or  shortness of breath. She did take her Maxzide, 1/2 tablet as directed. Pt rested, drank some water, took 1/2 Xanax and rechecked an hour ago was 141/84. Pt asking if anything else she needs to do or change in her Maxzide.

## 2012-10-03 ENCOUNTER — Other Ambulatory Visit: Payer: Self-pay | Admitting: *Deleted

## 2012-10-03 MED ORDER — ESCITALOPRAM OXALATE 10 MG PO TABS: 10.0000 mg | ORAL_TABLET | Freq: Every day | ORAL | Status: DC

## 2012-10-13 ENCOUNTER — Telehealth: Payer: Self-pay | Admitting: *Deleted

## 2012-10-13 NOTE — Telephone Encounter (Signed)
I am ok with her going back up to one whole tablet per day of her bp med.  She just took her xanax.  Should help.  She needs to relax and give it some time before checking her blood pressure again today.  If any acute sx, needs eval.  Can schedule her an appt this week for evaluation.

## 2012-10-13 NOTE — Telephone Encounter (Signed)
Pt called to inform us that her BP is currently 158/102. Went ahead & took other half of BP med, anxiety med, 4 ASA 81mg . BP still hasn't went down.  Other readings taken today: 141/102, 130/94,145/98 (all in the last hour)-Please advise

## 2012-10-13 NOTE — Telephone Encounter (Signed)
Pt informed to increase BP med to a whole daily. Pt will let us know if BP doesn't improve. Will call back to be seen this week.

## 2012-10-15 ENCOUNTER — Other Ambulatory Visit (INDEPENDENT_AMBULATORY_CARE_PROVIDER_SITE_OTHER): Payer: Medicare PPO

## 2012-10-15 ENCOUNTER — Encounter: Payer: Self-pay | Admitting: *Deleted

## 2012-10-15 DIAGNOSIS — E876 Hypokalemia: Secondary | ICD-10-CM

## 2012-10-15 DIAGNOSIS — N289 Disorder of kidney and ureter, unspecified: Secondary | ICD-10-CM

## 2012-10-15 LAB — BASIC METABOLIC PANEL
BUN: 18 mg/dL (ref 6–23)
CO2: 28 mEq/L (ref 19–32)
Calcium: 9.6 mg/dL (ref 8.4–10.5)
Creatinine, Ser: 1.3 mg/dL — ABNORMAL HIGH (ref 0.4–1.2)

## 2012-10-28 ENCOUNTER — Ambulatory Visit: Payer: Self-pay | Admitting: Oncology

## 2012-10-29 LAB — CBC CANCER CENTER
Basophil #: 0.1 x10 3/mm (ref 0.0–0.1)
Eosinophil #: 0.1 x10 3/mm (ref 0.0–0.7)
HGB: 11.7 g/dL — ABNORMAL LOW (ref 12.0–16.0)
Lymphocyte #: 1.8 x10 3/mm (ref 1.0–3.6)
Lymphocyte %: 21 %
MCH: 27.9 pg (ref 26.0–34.0)
MCV: 83 fL (ref 80–100)
Neutrophil #: 6.1 x10 3/mm (ref 1.4–6.5)
Neutrophil %: 70.4 %
RBC: 4.19 10*6/uL (ref 3.80–5.20)
WBC: 8.6 x10 3/mm (ref 3.6–11.0)

## 2012-10-29 LAB — COMPREHENSIVE METABOLIC PANEL
Anion Gap: 8 (ref 7–16)
Calcium, Total: 9.3 mg/dL (ref 8.5–10.1)
Creatinine: 1.45 mg/dL — ABNORMAL HIGH (ref 0.60–1.30)
EGFR (Non-African Amer.): 38 — ABNORMAL LOW
Glucose: 98 mg/dL (ref 65–99)
Osmolality: 279 (ref 275–301)
SGOT(AST): 16 U/L (ref 15–37)
SGPT (ALT): 26 U/L (ref 12–78)

## 2012-11-09 ENCOUNTER — Ambulatory Visit: Payer: Self-pay | Admitting: Oncology

## 2012-11-20 IMAGING — US US RENAL KIDNEY
1 series · 17 of 25 positions shown · non-contrast
Comparison: none

REASON FOR EXAM: Renal Insufficiency
COMMENTS:

[Series 1: us renal kidney · 17 of 37 slices shown]
[im 1/37]
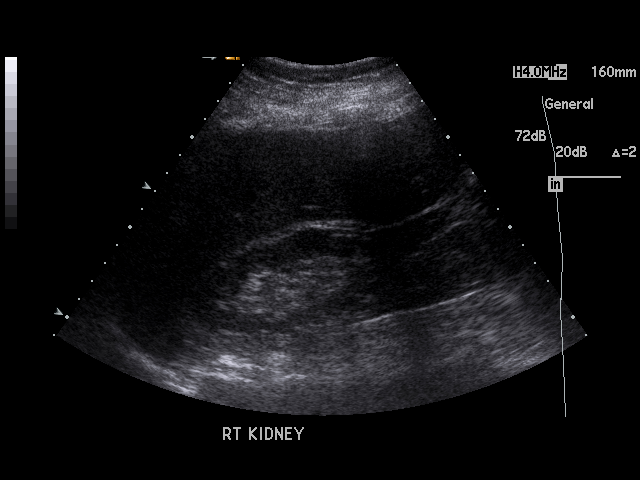
[im 4/37]
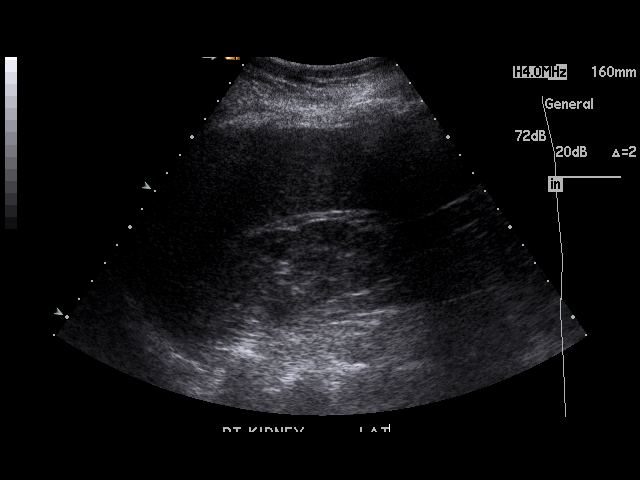
[im 5/37]
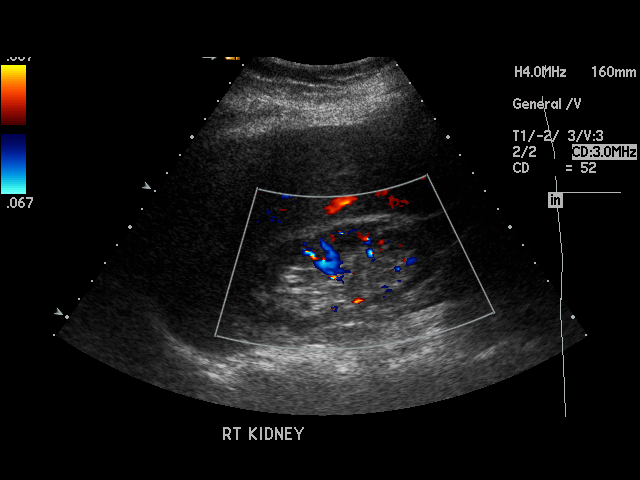
[im 8/37]
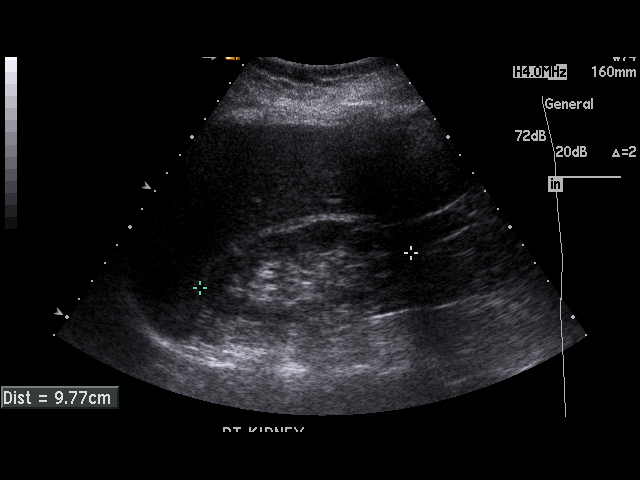
[im 10/37]
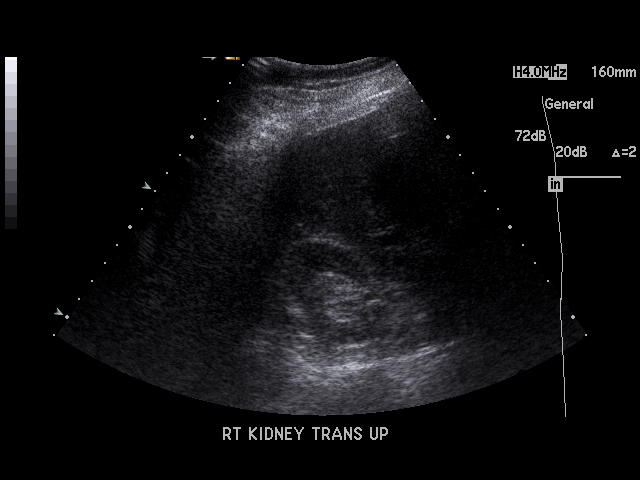
[im 13/37]
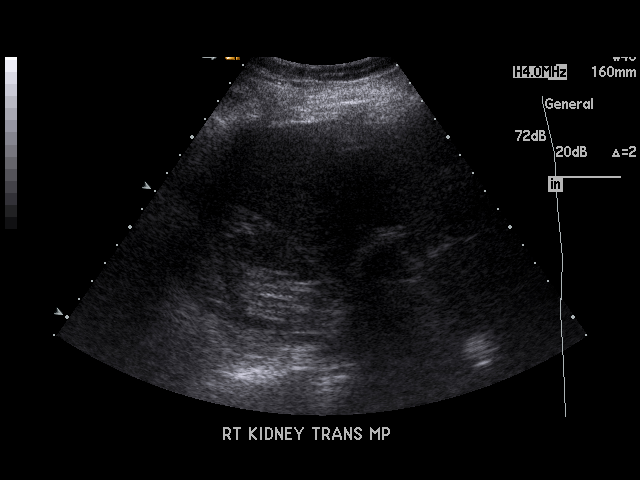
[im 14/37]
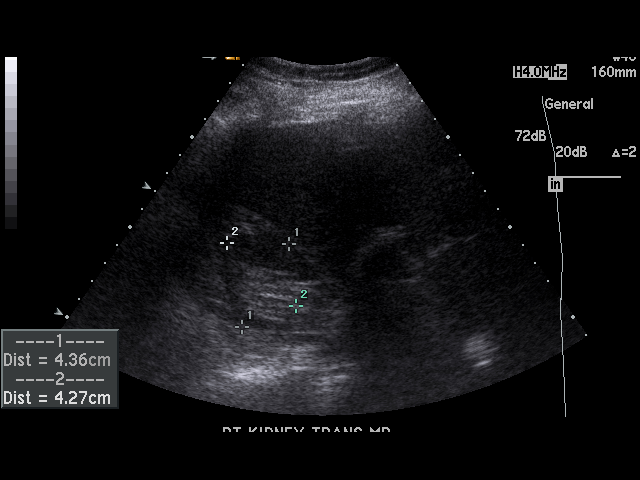
[im 17/37]
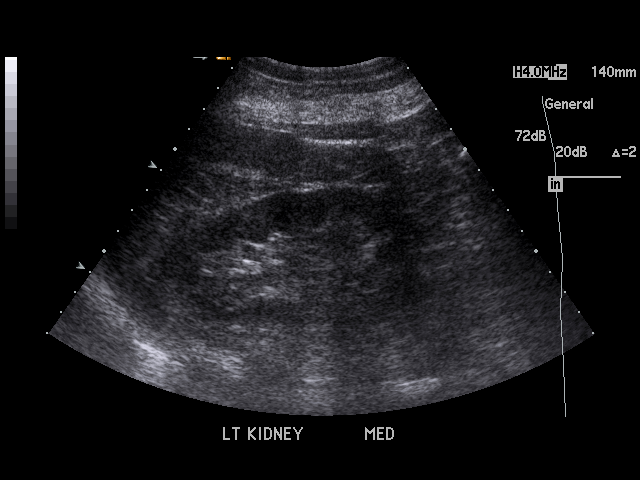
[im 19/37]
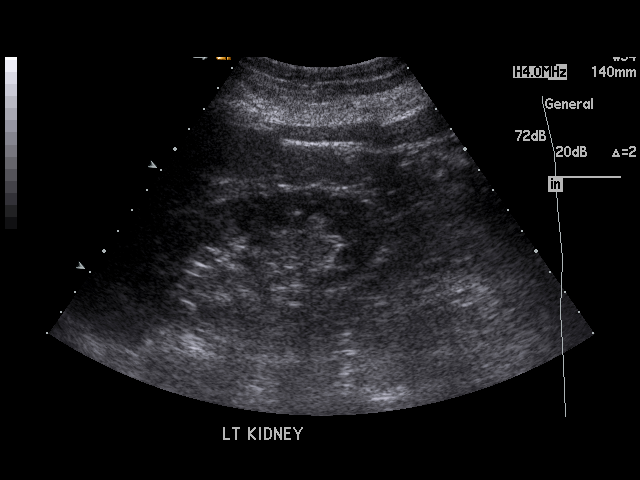
[im 20/37]
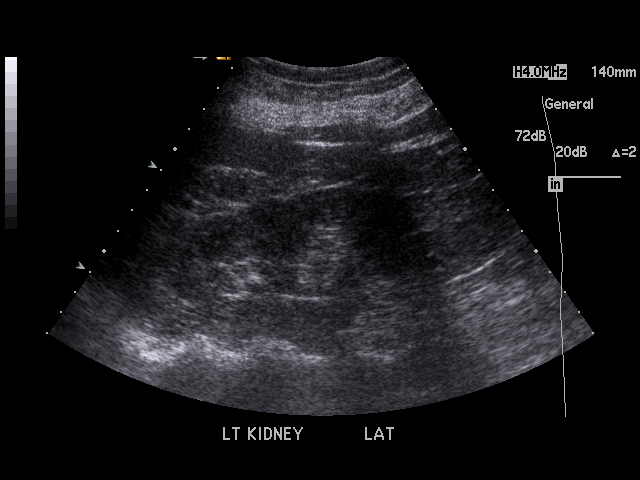
[im 23/37]
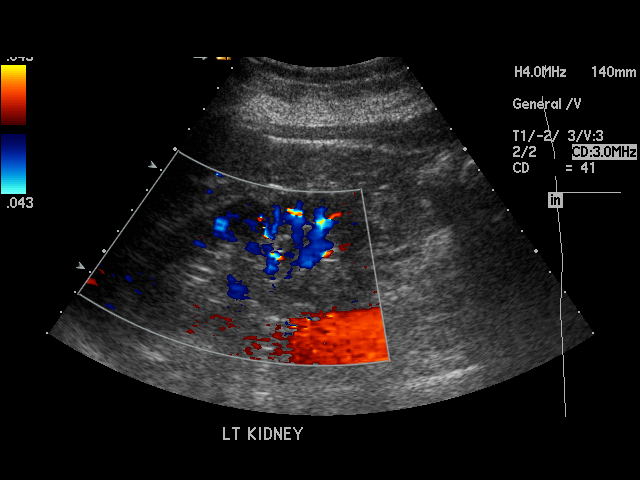
[im 25/37]
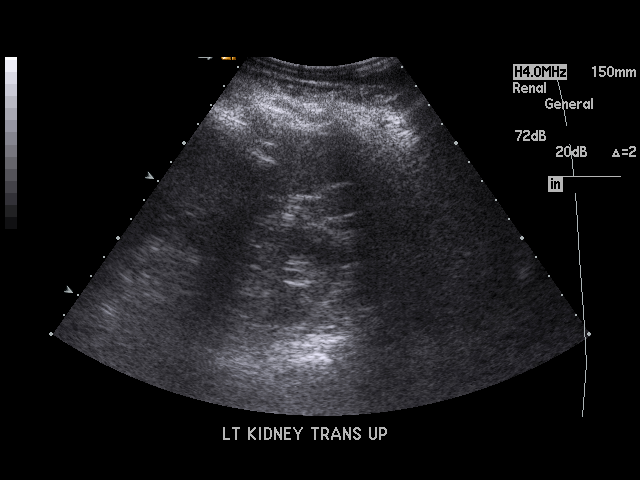
[im 28/37]
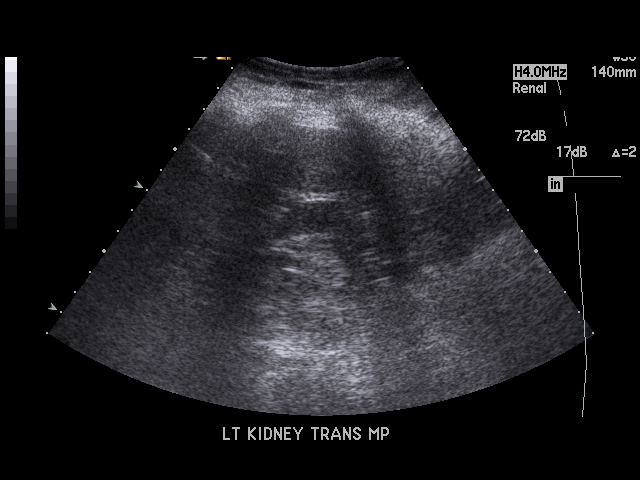
[im 29/37]
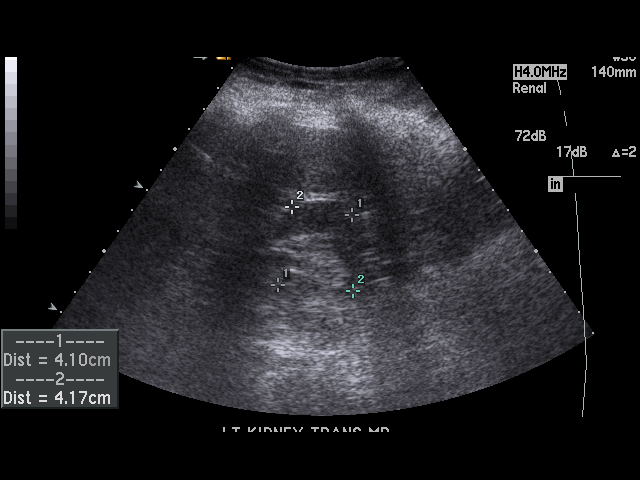
[im 32/37]
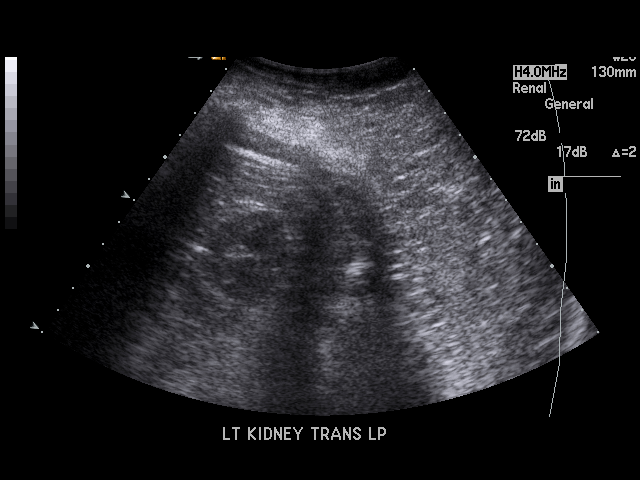
[im 34/37]
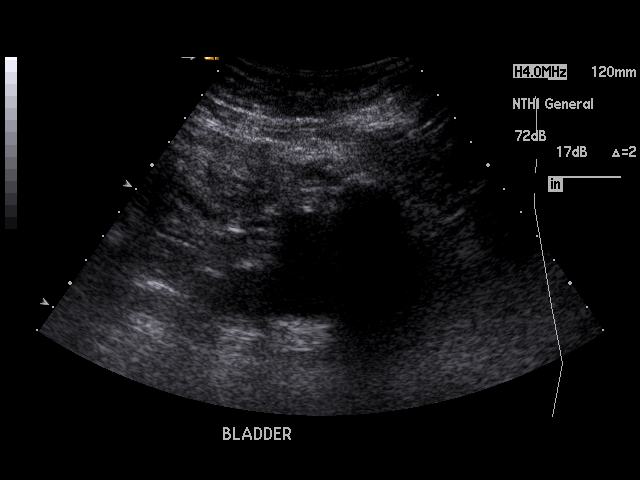
[im 37/37]
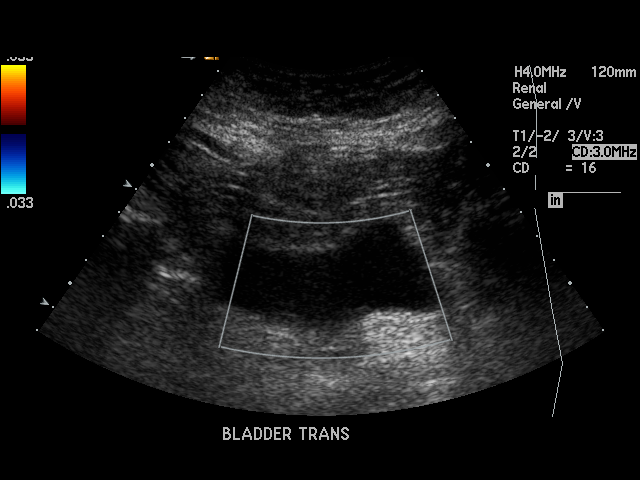

[17 of 25 positions shown; findings below may reference images not displayed]

PROCEDURE:     US  - US KIDNEY  - March 28, 2011  [DATE]

RESULT:     The right kidney measures 9.77 cm x 4.36 cm x 4.27 cm and the
left kidney measures 10.14 cm x 4.10 cm x 4.17 cm. The renal cortical
margins bilaterally are smooth. No solid or cystic renal mass lesions are
seen. No renal calcifications are identified. The urinary bladder is not
well filled but the visualized portion shows no significant abnormalities.
IMPRESSION: No significant abnormalities are identified. Specifically, no hydronephrosis
or other acute change is observed.

## 2012-12-12 LAB — BASIC METABOLIC PANEL
Anion Gap: 8 (ref 7–16)
Calcium, Total: 9.2 mg/dL (ref 8.5–10.1)
Chloride: 104 mmol/L (ref 98–107)
Co2: 27 mmol/L (ref 21–32)
Creatinine: 1.5 mg/dL — ABNORMAL HIGH (ref 0.60–1.30)
Potassium: 2.9 mmol/L — ABNORMAL LOW (ref 3.5–5.1)
Sodium: 139 mmol/L (ref 136–145)

## 2012-12-12 LAB — CK TOTAL AND CKMB (NOT AT ARMC)
CK, Total: 102 U/L (ref 21–215)
CK-MB: 0.9 ng/mL (ref 0.5–3.6)

## 2012-12-12 LAB — CBC
HCT: 35.6 % (ref 35.0–47.0)
MCHC: 33.6 g/dL (ref 32.0–36.0)
MCV: 83 fL (ref 80–100)
Platelet: 233 10*3/uL (ref 150–440)
RBC: 4.29 10*6/uL (ref 3.80–5.20)
RDW: 13.6 % (ref 11.5–14.5)

## 2012-12-12 LAB — PROTIME-INR: Prothrombin Time: 13 secs (ref 11.5–14.7)

## 2012-12-13 ENCOUNTER — Observation Stay: Payer: Self-pay | Admitting: Internal Medicine

## 2012-12-13 DIAGNOSIS — R079 Chest pain, unspecified: Secondary | ICD-10-CM

## 2012-12-13 LAB — BASIC METABOLIC PANEL
Anion Gap: 8 (ref 7–16)
BUN: 15 mg/dL (ref 7–18)
Calcium, Total: 8.6 mg/dL (ref 8.5–10.1)
Chloride: 106 mmol/L (ref 98–107)
Co2: 27 mmol/L (ref 21–32)
Creatinine: 1.24 mg/dL (ref 0.60–1.30)
EGFR (African American): 54 — ABNORMAL LOW
EGFR (Non-African Amer.): 46 — ABNORMAL LOW
Glucose: 106 mg/dL — ABNORMAL HIGH (ref 65–99)
Osmolality: 283 (ref 275–301)
Potassium: 3.1 mmol/L — ABNORMAL LOW (ref 3.5–5.1)
Sodium: 141 mmol/L (ref 136–145)

## 2012-12-13 LAB — TROPONIN I: Troponin-I: <0.02 ng/mL

## 2012-12-26 ENCOUNTER — Telehealth: Payer: Self-pay | Admitting: Internal Medicine

## 2012-12-26 NOTE — Telephone Encounter (Signed)
FYI

## 2012-12-26 NOTE — Telephone Encounter (Signed)
Reviewed message.  It appears her blood pressure has been elevated.  Per note, she took an extra maxide x 2 last night.  She does not need to take this medication multiple times per day.  Agree with skipping her dose this am.  It appears blood pressure is down now.  If increased blood pressure or problems over the weekend, recommend acute care.

## 2012-12-26 NOTE — Telephone Encounter (Signed)
Left detailed message on voicemail.  

## 2012-12-26 NOTE — Telephone Encounter (Signed)
Patient Information:  Caller Name: Lamanda  Phone: [phone]  Patient: Victoria Hanson, Victoria Hanson  Gender: Female  DOB: September 10, 1950  Age: 62 Years  PCP: Dale Woodfin  Office Follow Up:  Does the office need to follow up with this patient?: No  Instructions For The Office: N/A  RN Note:  Pt had elevated BP on 7-17 during the night, 167/115, Pt took extra Maxide, BP decreased to 136/90, Pt went to bed, Pt woke at 3am, BP 138/99, Pt took another Maxide.  BP was 129/86 at 0900 on 7-18, Pt will skip her morning Maxide dose.  Pt states BP has been elevated off and on for a while.  Pt is asymptomatic at time of call.  Pt has appt on 7-23 at office.  Advised Pt to call back if symptoms worsen or systolic increases to 180 or dystolic increases to 100 or over. Pt verbalized understanding.  Symptoms  Reason For Call & Symptoms: Elevated BP  Reviewed Health History In EMR: Yes  Reviewed Medications In EMR: Yes  Reviewed Allergies In EMR: Yes  Reviewed Surgeries / Procedures: Yes  Date of Onset of Symptoms: 12/25/2012  Treatments Tried: Maxide and Xanax  Treatments Tried Worked: Yes  Guideline(s) Used:  High Blood Pressure  Disposition Per Guideline:   Home Care  Reason For Disposition Reached:   BP < 140 /90 and taking BP medications  Advice Given:  N/A  Patient Will Follow Care Advice:  YES

## 2012-12-31 ENCOUNTER — Encounter: Payer: Self-pay | Admitting: Internal Medicine

## 2012-12-31 ENCOUNTER — Ambulatory Visit (INDEPENDENT_AMBULATORY_CARE_PROVIDER_SITE_OTHER): Payer: Medicare PPO | Admitting: Internal Medicine

## 2012-12-31 VITALS — BP 110/70 | HR 65 | Temp 98.5°F | Ht 64.0 in | Wt 188.5 lb

## 2012-12-31 DIAGNOSIS — E785 Hyperlipidemia, unspecified: Secondary | ICD-10-CM

## 2012-12-31 DIAGNOSIS — R197 Diarrhea, unspecified: Secondary | ICD-10-CM

## 2012-12-31 DIAGNOSIS — R945 Abnormal results of liver function studies: Secondary | ICD-10-CM

## 2012-12-31 DIAGNOSIS — D649 Anemia, unspecified: Secondary | ICD-10-CM

## 2012-12-31 DIAGNOSIS — F419 Anxiety disorder, unspecified: Secondary | ICD-10-CM

## 2012-12-31 DIAGNOSIS — K769 Liver disease, unspecified: Secondary | ICD-10-CM

## 2012-12-31 DIAGNOSIS — F32A Depression, unspecified: Secondary | ICD-10-CM

## 2012-12-31 DIAGNOSIS — F411 Generalized anxiety disorder: Secondary | ICD-10-CM

## 2012-12-31 DIAGNOSIS — F329 Major depressive disorder, single episode, unspecified: Secondary | ICD-10-CM

## 2012-12-31 DIAGNOSIS — I10 Essential (primary) hypertension: Secondary | ICD-10-CM

## 2012-12-31 DIAGNOSIS — R079 Chest pain, unspecified: Secondary | ICD-10-CM

## 2012-12-31 DIAGNOSIS — E7800 Pure hypercholesterolemia, unspecified: Secondary | ICD-10-CM

## 2012-12-31 DIAGNOSIS — K219 Gastro-esophageal reflux disease without esophagitis: Secondary | ICD-10-CM

## 2012-12-31 DIAGNOSIS — C189 Malignant neoplasm of colon, unspecified: Secondary | ICD-10-CM

## 2012-12-31 DIAGNOSIS — N289 Disorder of kidney and ureter, unspecified: Secondary | ICD-10-CM

## 2012-12-31 LAB — CBC WITH DIFFERENTIAL/PLATELET
Basophils Absolute: 0 10*3/uL (ref 0.0–0.1)
Basophils Relative: 0.4 % (ref 0.0–3.0)
Eosinophils Absolute: 0.1 10*3/uL (ref 0.0–0.7)
Lymphocytes Relative: 20.1 % (ref 12.0–46.0)
MCHC: 33.7 g/dL (ref 30.0–36.0)
MCV: 85.4 fl (ref 78.0–100.0)
Monocytes Absolute: 0.5 10*3/uL (ref 0.1–1.0)
Neutrophils Relative %: 72.7 % (ref 43.0–77.0)
Platelets: 247 10*3/uL (ref 150.0–400.0)
RDW: 13.5 % (ref 11.5–14.6)

## 2012-12-31 LAB — HEPATIC FUNCTION PANEL
ALT: 13 U/L (ref 0–35)
AST: 18 U/L (ref 0–37)
Albumin: 3.7 g/dL (ref 3.5–5.2)
Alkaline Phosphatase: 79 U/L (ref 39–117)
Bilirubin, Direct: 0 mg/dL (ref 0.0–0.3)
Total Protein: 7.2 g/dL (ref 6.0–8.3)

## 2012-12-31 LAB — LDL CHOLESTEROL, DIRECT: Direct LDL: 163.4 mg/dL

## 2012-12-31 LAB — BASIC METABOLIC PANEL
BUN: 20 mg/dL (ref 6–23)
Calcium: 9.3 mg/dL (ref 8.4–10.5)
Creatinine, Ser: 1.3 mg/dL — ABNORMAL HIGH (ref 0.4–1.2)
GFR: 44.75 mL/min — ABNORMAL LOW (ref >60.00)
Glucose, Bld: 96 mg/dL (ref 70–99)
Potassium: 3 mEq/L — ABNORMAL LOW (ref 3.5–5.1)

## 2012-12-31 LAB — LIPID PANEL
Cholesterol: 234 mg/dL — ABNORMAL HIGH (ref 0–200)
VLDL: 31.2 mg/dL (ref 0.0–40.0)

## 2012-12-31 MED ORDER — ALPRAZOLAM 0.5 MG PO TABS: 0.5000 mg | ORAL_TABLET | Freq: Every evening | ORAL | Status: DC | PRN

## 2012-12-31 NOTE — Progress Notes (Signed)
Subjective:    Patient ID: Victoria Hanson, female    DOB: 19-Nov-1950, 62 y.o.   MRN: [id number]  HPI 62 year old female with past history of colon cancer, lung cancer (s/p lobectomy), hypertension and hypercholesterolemia who comes in today to follow up on these issues as well as for a complete physical exam.   States she was evaluated in the ER on 12/13/12 for chest pain.  Was referred to Dr Mariah Milling.  States had stress test that was ok.  No further pain.  Breathing stable.  She had called in because her blood pressure was fluctuating.  She is back on Lexapro now and states her blood pressure has leveled off.  Still takes 1/2 of xanax at night to help her sleep.  Overall doing better.  She did see Dr Michela Pitcher.  Had a mammogram and follow up mammogram and ultrasound.  He evaluated and felt no further w/up warranted at this time (per patient).  States she was notified about a six month follow and she was questioning whether or not she needed the 6 month f/u mammogram.  Explained to her that I would proceed with the f/u as planned.  Some diarrhea.  Discussed welchol.  Questions answered.       Past Medical History  Diagnosis Date  . Colon cancer 1999    Pt had partial colectomy. Did develop lung metastasis. Had right upper lobectomy in 01/2007 then had associated chemotherapy.  . Hypercholesterolemia   . Hypertension   . Depression   . Nephrolithiasis   . Elevated transaminase level     ? NASH  . GERD (gastroesophageal reflux disease)   . Holter monitor, abnormal 12/2009    NSR, rare PACs and PVCs  . Echocardiogram abnormal 02/2010    EF 55-60%, mild LAE, no regional wall motion abnormalities  . Lung cancer     S/P resection (lobectomy - right)  . COPD (chronic obstructive pulmonary disease)     Current Outpatient Prescriptions on File Prior to Visit  Medication Sig Dispense Refill  . albuterol (PROVENTIL HFA;VENTOLIN HFA) 108 (90 BASE) MCG/ACT inhaler Inhale 2 puffs into the lungs every 6 (six)  hours as needed for wheezing.  3.7 g  1  . ALPRAZolam (XANAX) 0.5 MG tablet Take 1 tablet (0.5 mg total) by mouth at bedtime as needed.  90 tablet  0  . Cholecalciferol (VITAMIN D3) 400 UNITS CAPS Take by mouth. Take two tablets daily       . colesevelam (WELCHOL) 625 MG tablet Take 3 tablets bid  180 tablet  3  . escitalopram (LEXAPRO) 10 MG tablet Take 1 tablet (10 mg total) by mouth daily.  30 tablet  2  . fluticasone (FLONASE) 50 MCG/ACT nasal spray Place 2 sprays into the nose daily.  16 g  0  . KLOR-CON M20 20 MEQ tablet TAKE ONE (1) TABLET EACH DAY  30 tablet  5  . omeprazole (PRILOSEC OTC) 20 MG tablet Take 1 tablet (20 mg total) by mouth daily.  30 tablet  5  . triamterene-hydrochlorothiazide (MAXZIDE-25) 37.5-25 MG per tablet TAKE ONE (1) TABLET EACH DAY  30 tablet  5  . vitamin E 400 UNIT capsule Take 400 Units by mouth daily.       No current facility-administered medications on file prior to visit.    Review of Systems Patient denies any headache, lightheadedness or dizziness.  No sinus or allergy symptoms.   No chest pain now.  No chest tightness or  palpitations.  No increased shortness of breath, cough or congestion.  No acid reflux.  No nausea or vomiting.  No abdominal pain or cramping.  Bowel change as outlined. Some diarrhea.  No BRBPR or melana.  Feels better since being back on Lexapro.  Uses the xanax as outlined.  Blood pressure doing better.       Objective:   Physical Exam  Filed Vitals:   12/31/12 0924  BP: 110/70  Pulse: 65  Temp: 98.5 F (36.9 C)   Blood pressure recheck:  114/72, pulse 67  62 year old female in no acute distress.   HEENT:  Nares- clear.  Oropharynx - without lesions. NECK:  Supple.  Nontender.  No audible bruit.  HEART:  Appears to be regular. LUNGS:  No crackles or wheezing audible.  Respirations even and unlabored.  RADIAL PULSE:  Equal bilaterally.    BREASTS:  No nipple discharge or nipple retraction present.  Could not appreciate  any distinct nodules or axillary adenopathy.  ABDOMEN:  Soft, nontender.  Bowel sounds present and normal.  No audible abdominal bruit.  GU: not performed.    EXTREMITIES:  No increased edema present.  DP pulses palpable and equal bilaterally.          Assessment & Plan:  PREVIOUS HEADACHE.  Headache resolved.  Follow.    PULMONARY.  Breathing stable.  Follow.    ABNORMAL MAMMOGRAM.  Saw Dr Michela Pitcher.  Had mammogram and follow up mammogram and ultrasound.  Per pt, he felt no further w/up warranted at this time.  Is scheduled a six month f/u as outlined.    HEALTH MAINTENANCE.  Physical today.  Mammogram as outlined.  Last colonoscopy 2012.  Agreed to referral to GI, but wants to wait until 9/14.

## 2013-01-01 ENCOUNTER — Other Ambulatory Visit: Payer: Self-pay | Admitting: Internal Medicine

## 2013-01-01 DIAGNOSIS — E876 Hypokalemia: Secondary | ICD-10-CM

## 2013-01-01 NOTE — Progress Notes (Signed)
Order placed for f/u potassium.  

## 2013-01-02 ENCOUNTER — Other Ambulatory Visit: Payer: Self-pay | Admitting: *Deleted

## 2013-01-02 MED ORDER — POTASSIUM CHLORIDE CRYS ER 20 MEQ PO TBCR: EXTENDED_RELEASE_TABLET | ORAL | Status: DC

## 2013-01-04 ENCOUNTER — Encounter: Payer: Self-pay | Admitting: Internal Medicine

## 2013-01-04 NOTE — Assessment & Plan Note (Signed)
Not taking any statin now.  Not on Welchol now.   Low cholesterol diet and exercise.  Check lipid panel with next fasting labs.    

## 2013-01-04 NOTE — Assessment & Plan Note (Signed)
S/P lobectomy.  Sees Dr Doylene Canning.  Breathing stable.

## 2013-01-04 NOTE — Assessment & Plan Note (Signed)
Renal function stable.  Follow.  Stay hydrated.  Check metabolic panel.

## 2013-01-04 NOTE — Assessment & Plan Note (Signed)
Back on lexapro.   Doing better.  Follow.  Uses xanax as directed.

## 2013-01-04 NOTE — Assessment & Plan Note (Signed)
Stable.  Follow.  On Lexapro.

## 2013-01-04 NOTE — Assessment & Plan Note (Signed)
Reports the reflux is controlled.  Continue prilosec.   

## 2013-01-04 NOTE — Assessment & Plan Note (Signed)
Colonoscopy.  Persistent diarrhea.  Agreeable to referral back to GI.  Wants appt in 9/14.

## 2013-01-04 NOTE — Assessment & Plan Note (Signed)
Previous chest pain.  Had stress test recently (per pt) - negative.  Saw Dr Mariah Milling.  No further pain.  Follow.

## 2013-01-04 NOTE — Assessment & Plan Note (Signed)
Blood pressure under good control now back on Lexapro.  Same medication regimen.  Follow.  Follow metabolic panel.

## 2013-01-09 ENCOUNTER — Other Ambulatory Visit: Payer: Self-pay | Admitting: Internal Medicine

## 2013-01-13 ENCOUNTER — Encounter: Payer: Self-pay | Admitting: Internal Medicine

## 2013-01-16 ENCOUNTER — Other Ambulatory Visit: Payer: Medicare PPO

## 2013-01-19 ENCOUNTER — Other Ambulatory Visit (INDEPENDENT_AMBULATORY_CARE_PROVIDER_SITE_OTHER): Payer: Medicare PPO

## 2013-01-19 ENCOUNTER — Ambulatory Visit: Payer: Self-pay | Admitting: Surgery

## 2013-01-19 ENCOUNTER — Other Ambulatory Visit: Payer: Medicare PPO

## 2013-01-19 DIAGNOSIS — E876 Hypokalemia: Secondary | ICD-10-CM

## 2013-01-19 LAB — POTASSIUM: Potassium: 4.1 mEq/L (ref 3.5–5.1)

## 2013-01-20 ENCOUNTER — Other Ambulatory Visit: Payer: Self-pay | Admitting: Internal Medicine

## 2013-01-20 DIAGNOSIS — N289 Disorder of kidney and ureter, unspecified: Secondary | ICD-10-CM

## 2013-01-20 DIAGNOSIS — E876 Hypokalemia: Secondary | ICD-10-CM

## 2013-01-20 NOTE — Progress Notes (Signed)
Order placed for f/u met b.  

## 2013-02-05 ENCOUNTER — Encounter: Payer: Self-pay | Admitting: Internal Medicine

## 2013-02-05 ENCOUNTER — Ambulatory Visit (INDEPENDENT_AMBULATORY_CARE_PROVIDER_SITE_OTHER): Payer: Medicare PPO | Admitting: Internal Medicine

## 2013-02-05 VITALS — BP 102/70 | HR 80 | Temp 98.3°F | Ht 64.0 in | Wt 187.0 lb

## 2013-02-05 DIAGNOSIS — C3490 Malignant neoplasm of unspecified part of unspecified bronchus or lung: Secondary | ICD-10-CM

## 2013-02-05 DIAGNOSIS — F411 Generalized anxiety disorder: Secondary | ICD-10-CM

## 2013-02-05 DIAGNOSIS — F3289 Other specified depressive episodes: Secondary | ICD-10-CM

## 2013-02-05 DIAGNOSIS — R4701 Aphasia: Secondary | ICD-10-CM

## 2013-02-05 DIAGNOSIS — R197 Diarrhea, unspecified: Secondary | ICD-10-CM

## 2013-02-05 DIAGNOSIS — F419 Anxiety disorder, unspecified: Secondary | ICD-10-CM

## 2013-02-05 DIAGNOSIS — E785 Hyperlipidemia, unspecified: Secondary | ICD-10-CM

## 2013-02-05 DIAGNOSIS — G459 Transient cerebral ischemic attack, unspecified: Secondary | ICD-10-CM

## 2013-02-05 DIAGNOSIS — F329 Major depressive disorder, single episode, unspecified: Secondary | ICD-10-CM

## 2013-02-05 DIAGNOSIS — I10 Essential (primary) hypertension: Secondary | ICD-10-CM

## 2013-02-05 DIAGNOSIS — K219 Gastro-esophageal reflux disease without esophagitis: Secondary | ICD-10-CM

## 2013-02-05 DIAGNOSIS — N289 Disorder of kidney and ureter, unspecified: Secondary | ICD-10-CM

## 2013-02-05 DIAGNOSIS — C189 Malignant neoplasm of colon, unspecified: Secondary | ICD-10-CM

## 2013-02-05 LAB — CBC WITH DIFFERENTIAL/PLATELET
Basophils Relative: 1.5 % (ref 0.0–3.0)
Eosinophils Absolute: 0.1 10*3/uL (ref 0.0–0.7)
MCHC: 33.4 g/dL (ref 30.0–36.0)
MCV: 85 fl (ref 78.0–100.0)
Monocytes Absolute: 0.5 10*3/uL (ref 0.1–1.0)
Neutro Abs: 7.1 10*3/uL (ref 1.4–7.7)
Neutrophils Relative %: 71.4 % (ref 43.0–77.0)
RBC: 4.48 Mil/uL (ref 3.87–5.11)
RDW: 13.5 % (ref 11.5–14.6)

## 2013-02-05 NOTE — Patient Instructions (Addendum)
Align - one per day 

## 2013-02-06 ENCOUNTER — Other Ambulatory Visit: Payer: Self-pay | Admitting: Internal Medicine

## 2013-02-06 LAB — BASIC METABOLIC PANEL
CO2: 28 mEq/L (ref 19–32)
Chloride: 104 mEq/L (ref 96–112)
Creatinine, Ser: 1.2 mg/dL (ref 0.4–1.2)
Glucose, Bld: 96 mg/dL (ref 70–99)

## 2013-02-08 ENCOUNTER — Other Ambulatory Visit: Payer: Self-pay | Admitting: Internal Medicine

## 2013-02-08 DIAGNOSIS — E876 Hypokalemia: Secondary | ICD-10-CM

## 2013-02-08 NOTE — Progress Notes (Signed)
Order placed for f/u potassium.  

## 2013-02-09 ENCOUNTER — Encounter: Payer: Self-pay | Admitting: Internal Medicine

## 2013-02-09 DIAGNOSIS — G939 Disorder of brain, unspecified: Secondary | ICD-10-CM | POA: Insufficient documentation

## 2013-02-09 DIAGNOSIS — G459 Transient cerebral ischemic attack, unspecified: Secondary | ICD-10-CM | POA: Insufficient documentation

## 2013-02-09 NOTE — Assessment & Plan Note (Signed)
Off Lexapro.  She felt the lexapro was contributing to her symptoms.  Will remain off for now.  Use xanax if needed.  Follow.

## 2013-02-09 NOTE — Assessment & Plan Note (Signed)
Blood pressure low today.  Readings as outlined.  Have her decrease the triam/hctz to 1/2 tablet q day.  Follow pressures.  Could be contributing to the light headedness.  Keep hydrated.  Follow.

## 2013-02-09 NOTE — Assessment & Plan Note (Signed)
Renal function stable.  Follow.  Stay hydrated.  Check metabolic panel.      

## 2013-02-09 NOTE — Assessment & Plan Note (Signed)
See above.  Use xanax as directed.

## 2013-02-09 NOTE — Assessment & Plan Note (Signed)
Not taking any statin now.  Not on Welchol now.   Low cholesterol diet and exercise.  Check lipid panel with next fasting labs.    

## 2013-02-09 NOTE — Progress Notes (Signed)
Subjective:    Patient ID: Victoria Hanson, female    DOB: 11-04-1950, 62 y.o.   MRN: [id number]  HPI 62 year old female with past history of colon cancer, lung cancer (s/p lobectomy), hypertension and hypercholesterolemia who comes in today with concerns regarding light headedness and "hands shaking".  States she was evaluated in the ER on 12/13/12 for chest pain.  Was referred to Dr Mariah Milling.  States had stress test that was ok.  No further pain.  Breathing stable.  States she went to a meeting two weeks ago.  Was talking to a gentleman and noticed that her words would not come out the "right way".  This lasted for a while.  She is not sure of the duration.  Was able to communicate to her daughter after the meeting what had happened.  No trouble finding her words then.  She also reports noticing some light headedness and hands shaking in the am.  After she is up and moving for a while - better.  She felt these symptoms were related to the lexapro.   She tapered off the medication.  Still with the light headed sensation.  States she has been eating and drinking normally.  No vomiting.  No bowel change.  Still with loose stool.  Not taking the welchol.  No further issues with finding her words or difficulty speaking.  Not on aspirin.     Past Medical History  Diagnosis Date  . Colon cancer 1999    Pt had partial colectomy. Did develop lung metastasis. Had right upper lobectomy in 01/2007 then had associated chemotherapy.  . Hypercholesterolemia   . Hypertension   . Depression   . Nephrolithiasis   . Elevated transaminase level     ? NASH  . GERD (gastroesophageal reflux disease)   . Holter monitor, abnormal 12/2009    NSR, rare PACs and PVCs  . Echocardiogram abnormal 02/2010    EF 55-60%, mild LAE, no regional wall motion abnormalities  . Lung cancer     S/P resection (lobectomy - right)  . COPD (chronic obstructive pulmonary disease)     Current Outpatient Prescriptions on File Prior to  Visit  Medication Sig Dispense Refill  . albuterol (PROVENTIL HFA;VENTOLIN HFA) 108 (90 BASE) MCG/ACT inhaler Inhale 2 puffs into the lungs every 6 (six) hours as needed for wheezing.  3.7 g  1  . ALPRAZolam (XANAX) 0.5 MG tablet Take 1 tablet (0.5 mg total) by mouth at bedtime as needed.  90 tablet  0  . Cholecalciferol (VITAMIN D3) 400 UNITS CAPS Take by mouth. Take two tablets daily       . fluticasone (FLONASE) 50 MCG/ACT nasal spray Place 2 sprays into the nose daily.  16 g  0  . omeprazole (PRILOSEC OTC) 20 MG tablet Take 1 tablet (20 mg total) by mouth daily.  30 tablet  5  . potassium chloride SA (KLOR-CON M20) 20 MEQ tablet TAKE ONE (1) TABLET TWICE A DAY  60 tablet  5  . triamterene-hydrochlorothiazide (MAXZIDE-25) 37.5-25 MG per tablet TAKE ONE (1) TABLET EACH DAY  30 tablet  5   No current facility-administered medications on file prior to visit.    Review of Systems Patient denies any headache.  Light headedness as outlined.  No sinus or allergy symptoms.   No chest pain now.  No chest tightness or palpitations.  No increased shortness of breath, cough or congestion.  No acid reflux.  No nausea or vomiting.  No  abdominal pain or cramping.  Bowel change as outlined. Some diarrhea.  No BRBPR or melana.  Off lexapro.  Felt this was contributing to some of the above symptoms.       Objective:   Physical Exam  Filed Vitals:   02/05/13 1315  BP: 102/70  Pulse: 80  Temp: 98.3 F (36.8 C)   Blood pressure recheck:  104/78, pulse 78 standing.  108/78 lying.    62 year old female in no acute distress.   HEENT:  Nares- clear.  Oropharynx - without lesions. NECK:  Supple.  Nontender.  No audible bruit.  HEART:  Appears to be regular. LUNGS:  No crackles or wheezing audible.  Respirations even and unlabored.  RADIAL PULSE:  Equal bilaterally.   ABDOMEN:  Soft, nontender.  Bowel sounds present and normal.  No audible abdominal bruit.    EXTREMITIES:  No increased edema present.  DP  pulses palpable and equal bilaterally.      NEURO:  No focal neuro deficits noted.      Assessment & Plan:  PREVIOUS HEADACHE.  Headache resolved.    PULMONARY.  Breathing stable.  Follow.    ABNORMAL MAMMOGRAM.  Saw Dr Michela Pitcher.  Had mammogram and follow up mammogram and ultrasound.  Per pt, he felt no further w/up warranted at this time.  Is scheduled a six month f/u.     HEALTH MAINTENANCE.  Physical last visit.  Mammogram as outlined.  Last colonoscopy 2012.  Agreed to referral to GI, but wanted to wait until 9/14.  Will refer back given the persistent bowel change.

## 2013-02-09 NOTE — Assessment & Plan Note (Signed)
S/P lobectomy.  Sees Dr Choksi.  Breathing stable.     

## 2013-02-09 NOTE — Assessment & Plan Note (Signed)
Colonoscopy.  Persistent diarrhea.  Agreeable to referral back to GI.  Wants appt in 9/14.   

## 2013-02-09 NOTE — Assessment & Plan Note (Signed)
Possible TIA.  Possible expressive aphasia.  Start daily aspirin 81mg  q day.  Check MRI (brain) and carotid ultrasound.  Adjust blood pressure medication as outlined.  Explained to her if symptoms changed, worsened, returned, etc - then would need evaluation immediately.

## 2013-02-09 NOTE — Assessment & Plan Note (Signed)
Reports the reflux is controlled.  Continue prilosec.   

## 2013-02-11 ENCOUNTER — Encounter: Payer: Self-pay | Admitting: *Deleted

## 2013-02-13 ENCOUNTER — Ambulatory Visit: Payer: Self-pay | Admitting: Internal Medicine

## 2013-02-17 ENCOUNTER — Telehealth: Payer: Self-pay | Admitting: Internal Medicine

## 2013-02-17 NOTE — Telephone Encounter (Signed)
LMTCB

## 2013-02-17 NOTE — Telephone Encounter (Signed)
This is the one I spoke to you about.  I can see her 02/19/13 at 2:30.  Please call and notify her of appt time.  Thanks.

## 2013-02-17 NOTE — Telephone Encounter (Signed)
She had problems with the lexapro.  I would not recommend going back on the lexapro.  She has xanax to use if needed.   What is going on?  I can see her if needed.

## 2013-02-17 NOTE — Telephone Encounter (Signed)
The patient wants to know if she should go back on a half tablet of her Lexapro until she can be seen in the office to help her nerves.   Results of her MRI.

## 2013-02-17 NOTE — Telephone Encounter (Signed)
Pt notified of MRI & U/S results-would like to proceed with the [facility] referral. She also would like to speak with you. She has more questions & wants to get in earlier for an appt.

## 2013-02-17 NOTE — Telephone Encounter (Signed)
I will give pt her MRI results (on my desk) once I get your response on the Lexapro to eliminate two calls

## 2013-02-18 NOTE — Telephone Encounter (Signed)
I spoke to Victoria Hanson about this pt and I think she called her and notified her of appt.  Please confirm with Victoria Hanson that pt is aware of appt and then no need to call pt back.  thanks

## 2013-02-19 ENCOUNTER — Ambulatory Visit (INDEPENDENT_AMBULATORY_CARE_PROVIDER_SITE_OTHER): Payer: Medicare PPO | Admitting: Internal Medicine

## 2013-02-19 ENCOUNTER — Encounter: Payer: Self-pay | Admitting: Internal Medicine

## 2013-02-19 VITALS — BP 118/60 | HR 71 | Temp 98.4°F | Ht 64.0 in | Wt 189.5 lb

## 2013-02-19 DIAGNOSIS — F329 Major depressive disorder, single episode, unspecified: Secondary | ICD-10-CM

## 2013-02-19 DIAGNOSIS — I7789 Other specified disorders of arteries and arterioles: Secondary | ICD-10-CM

## 2013-02-19 DIAGNOSIS — C189 Malignant neoplasm of colon, unspecified: Secondary | ICD-10-CM

## 2013-02-19 DIAGNOSIS — I773 Arterial fibromuscular dysplasia: Secondary | ICD-10-CM

## 2013-02-19 DIAGNOSIS — N289 Disorder of kidney and ureter, unspecified: Secondary | ICD-10-CM

## 2013-02-19 DIAGNOSIS — C3490 Malignant neoplasm of unspecified part of unspecified bronchus or lung: Secondary | ICD-10-CM

## 2013-02-19 DIAGNOSIS — G459 Transient cerebral ischemic attack, unspecified: Secondary | ICD-10-CM

## 2013-02-19 DIAGNOSIS — F411 Generalized anxiety disorder: Secondary | ICD-10-CM

## 2013-02-19 DIAGNOSIS — K219 Gastro-esophageal reflux disease without esophagitis: Secondary | ICD-10-CM

## 2013-02-19 DIAGNOSIS — F419 Anxiety disorder, unspecified: Secondary | ICD-10-CM

## 2013-02-19 DIAGNOSIS — I10 Essential (primary) hypertension: Secondary | ICD-10-CM

## 2013-02-19 DIAGNOSIS — E785 Hyperlipidemia, unspecified: Secondary | ICD-10-CM

## 2013-02-19 MED ORDER — BUPROPION HCL ER (XL) 150 MG PO TB24: 150.0000 mg | ORAL_TABLET | Freq: Every day | ORAL | Status: DC

## 2013-02-22 ENCOUNTER — Encounter: Payer: Self-pay | Admitting: Internal Medicine

## 2013-02-22 ENCOUNTER — Telehealth: Payer: Self-pay | Admitting: Internal Medicine

## 2013-02-22 NOTE — Assessment & Plan Note (Signed)
S/P lobectomy.  Sees Dr Choksi.  Breathing stable.     

## 2013-02-22 NOTE — Assessment & Plan Note (Signed)
Not taking any statin now.  Not on Welchol now.   Low cholesterol diet and exercise.  Check lipid panel with next fasting labs.

## 2013-02-22 NOTE — Assessment & Plan Note (Signed)
Off Lexapro.  She felt the lexapro was contributing to her symptoms.  Will remain off for now.  Use xanax if needed.  After discussion, it was decided to give her a trial of wellbutrin.  Follow.  Has tried various SSRIs.

## 2013-02-22 NOTE — Assessment & Plan Note (Signed)
See last note for details.  On aspirin now.  No reoccurring problems.  Carotid ultrasound revealed no significant stenosis.  Did have changes consistent with fibromuscular dysplasia.  MRI brain revealed no acute abnormality.  Did have some chronic white matter changes.  Wanted to hold on neurology referral.  Did agree to referral to vascular surgery to further evaluate - fibromuscular dysplasia.

## 2013-02-22 NOTE — Assessment & Plan Note (Signed)
Reports the reflux is controlled.  Continue prilosec.   

## 2013-02-22 NOTE — Assessment & Plan Note (Signed)
Have decreased the triam/hctz to 1/2 tablet q day.  Keep hydrated.  Light headedness has resolved.  Follow.

## 2013-02-22 NOTE — Progress Notes (Signed)
Subjective:    Patient ID: Victoria Hanson, female    DOB: 03-17-1951, 62 y.o.   MRN: [id number]  HPI 62 year old female with past history of colon cancer, lung cancer (s/p lobectomy), hypertension and hypercholesterolemia who comes in today with concerns regarding some increased anxiety.  States she was evaluated in the ER on 12/13/12 for chest pain.  Was referred to Dr Mariah Milling.  States had stress test that was ok.  No further pain.  Breathing stable.  Last visit had some concern regarding a possible TIA.  See that note for details.  Was started on aspirin.  Carotid ultrasound revealed no significant stenosis. Did reveal some changes possibly characteristic of fibromuscular dysplasia.  We discussed this today.  She was agreeable for referral to Vascular surgery to determine if any further w/up warranted.  She also had MRI brain that revealed no acute abnormality.  Did have some chronic white matter changes.  Has not had reoccurring problems.  She does report some increased anxiety.  Has tried cymbalta, lexapro, prozac.  Feels she needs to be on something to help with the stress and anxiety.  She does take 1/2 xanax as needed.  This helps.  Eating and drinking well.  Is only on 1/2 hctz now.  The light headedness has improved.     Past Medical History  Diagnosis Date  . Colon cancer 1999    Pt had partial colectomy. Did develop lung metastasis. Had right upper lobectomy in 01/2007 then had associated chemotherapy.  . Hypercholesterolemia   . Hypertension   . Depression   . Nephrolithiasis   . Elevated transaminase level     ? NASH  . GERD (gastroesophageal reflux disease)   . Holter monitor, abnormal 12/2009    NSR, rare PACs and PVCs  . Echocardiogram abnormal 02/2010    EF 55-60%, mild LAE, no regional wall motion abnormalities  . Lung cancer     S/P resection (lobectomy - right)  . COPD (chronic obstructive pulmonary disease)     Current Outpatient Prescriptions on File Prior to Visit   Medication Sig Dispense Refill  . albuterol (PROVENTIL HFA;VENTOLIN HFA) 108 (90 BASE) MCG/ACT inhaler Inhale 2 puffs into the lungs every 6 (six) hours as needed for wheezing.  3.7 g  1  . ALPRAZolam (XANAX) 0.5 MG tablet Take 1 tablet (0.5 mg total) by mouth at bedtime as needed.  90 tablet  0  . Cholecalciferol (VITAMIN D3) 400 UNITS CAPS Take by mouth. Take two tablets daily       . fluticasone (FLONASE) 50 MCG/ACT nasal spray Place 2 sprays into the nose daily.  16 g  0  . omeprazole (PRILOSEC OTC) 20 MG tablet Take 1 tablet (20 mg total) by mouth daily.  30 tablet  5  . potassium chloride SA (KLOR-CON M20) 20 MEQ tablet TAKE ONE (1) TABLET TWICE A DAY  60 tablet  5   No current facility-administered medications on file prior to visit.    Review of Systems Patient denies any headache.  Light headedness improved as outlined.  No sinus or allergy symptoms.   No chest pain.  No chest tightness or palpitations.  No increased shortness of breath, cough or congestion.  No acid reflux.  No nausea or vomiting.  No abdominal pain or cramping.   No BRBPR or melana.  Off lexapro. Just taking alprazolam now.  See above.  Feels she needs to be on something more for her anxiety.  Objective:   Physical Exam  Filed Vitals:   02/19/13 1423  BP: 118/60  Pulse: 71  Temp: 98.4 F (36.9 C)   Blood pressure recheck:  58/99  62 year old female in no acute distress.   HEENT:  Nares- clear.  Oropharynx - without lesions. NECK:  Supple.  Nontender.  No audible bruit.  HEART:  Appears to be regular. LUNGS:  No crackles or wheezing audible.  Respirations even and unlabored.  RADIAL PULSE:  Equal bilaterally.   ABDOMEN:  Soft, nontender.  Bowel sounds present and normal.  No audible abdominal bruit.    EXTREMITIES:  No increased edema present.  DP pulses palpable and equal bilaterally.      NEURO:  No focal neuro deficits noted.      Assessment & Plan:  PREVIOUS HEADACHE.  Headache resolved.     PULMONARY.  Breathing stable.  Follow.    ABNORMAL MAMMOGRAM.  Saw Dr Michela Pitcher.  Had mammogram and follow up mammogram and ultrasound.  Per pt, he felt no further w/up warranted at this time.  Is scheduled a six month f/u.     HEALTH MAINTENANCE.  Physical 12/31/12.   Mammogram as outlined.  Last colonoscopy 2012.  Agreed to referral to GI, but wanted to wait until 9/14.  Was referred back given the persistent bowel change.  Has upcoming appt.

## 2013-02-22 NOTE — Assessment & Plan Note (Signed)
See above.  Use xanax as directed.  Has tried various SSRIs.  Will try Wellbutrin.  Follow.

## 2013-02-22 NOTE — Assessment & Plan Note (Signed)
Renal function stable.  Follow.  Stay hydrated.  Follow metabolic panel.

## 2013-02-22 NOTE — Telephone Encounter (Signed)
You mad an appt with vascular surgery.  Please send a copy of my last two notes.  Thanks.

## 2013-02-22 NOTE — Assessment & Plan Note (Signed)
Colonoscopy.  Persistent diarrhea.  Agreeable to referral back to GI.  Has upcoming appt.

## 2013-02-23 NOTE — Telephone Encounter (Signed)
Completed.

## 2013-03-02 ENCOUNTER — Encounter: Payer: Self-pay | Admitting: Internal Medicine

## 2013-03-04 ENCOUNTER — Ambulatory Visit: Payer: Medicare PPO | Admitting: Internal Medicine

## 2013-03-06 ENCOUNTER — Telehealth: Payer: Self-pay | Admitting: Internal Medicine

## 2013-03-06 ENCOUNTER — Ambulatory Visit: Payer: Medicare PPO | Admitting: Internal Medicine

## 2013-03-06 MED ORDER — ALPRAZOLAM 0.5 MG PO TABS: 0.5000 mg | ORAL_TABLET | Freq: Three times a day (TID) | ORAL | Status: DC | PRN

## 2013-03-06 NOTE — Telephone Encounter (Signed)
LMTCB-need to know if she takes 1/2 or a whole tablet. ?nightly or prn. Sent in #90 on 7/23

## 2013-03-06 NOTE — Telephone Encounter (Signed)
Pt stated that she takes them up to three times a day as needed. Notified pt that I will fax the Rx to [facility] before 5:00 today.

## 2013-03-06 NOTE — Telephone Encounter (Signed)
I am ok to refill her xanax x 1

## 2013-03-06 NOTE — Telephone Encounter (Signed)
Cancelled appt for 4 p.m. Today, states she will wait until October appt.  Wanted to inform Dr. Lorin Picket that she is no longer taking wellbutrin.  Says she needs refill on Xanax, asking [place] to call that in for her to [facility] [employer].

## 2013-03-11 ENCOUNTER — Other Ambulatory Visit: Payer: Self-pay | Admitting: Internal Medicine

## 2013-03-27 ENCOUNTER — Encounter: Payer: Self-pay | Admitting: Internal Medicine

## 2013-04-02 ENCOUNTER — Ambulatory Visit (INDEPENDENT_AMBULATORY_CARE_PROVIDER_SITE_OTHER): Payer: Medicare PPO | Admitting: Internal Medicine

## 2013-04-02 ENCOUNTER — Encounter: Payer: Self-pay | Admitting: Internal Medicine

## 2013-04-02 VITALS — BP 130/80 | HR 71 | Temp 98.0°F | Ht 64.0 in | Wt 189.8 lb

## 2013-04-02 DIAGNOSIS — C3490 Malignant neoplasm of unspecified part of unspecified bronchus or lung: Secondary | ICD-10-CM

## 2013-04-02 DIAGNOSIS — C189 Malignant neoplasm of colon, unspecified: Secondary | ICD-10-CM

## 2013-04-02 DIAGNOSIS — Z23 Encounter for immunization: Secondary | ICD-10-CM

## 2013-04-02 DIAGNOSIS — F419 Anxiety disorder, unspecified: Secondary | ICD-10-CM

## 2013-04-02 DIAGNOSIS — K219 Gastro-esophageal reflux disease without esophagitis: Secondary | ICD-10-CM

## 2013-04-02 DIAGNOSIS — F329 Major depressive disorder, single episode, unspecified: Secondary | ICD-10-CM

## 2013-04-02 DIAGNOSIS — N289 Disorder of kidney and ureter, unspecified: Secondary | ICD-10-CM

## 2013-04-02 DIAGNOSIS — E876 Hypokalemia: Secondary | ICD-10-CM

## 2013-04-02 DIAGNOSIS — E785 Hyperlipidemia, unspecified: Secondary | ICD-10-CM

## 2013-04-02 DIAGNOSIS — G459 Transient cerebral ischemic attack, unspecified: Secondary | ICD-10-CM

## 2013-04-02 DIAGNOSIS — I10 Essential (primary) hypertension: Secondary | ICD-10-CM

## 2013-04-02 DIAGNOSIS — F411 Generalized anxiety disorder: Secondary | ICD-10-CM

## 2013-04-02 LAB — BASIC METABOLIC PANEL
CO2: 27 mEq/L (ref 19–32)
Calcium: 9.7 mg/dL (ref 8.4–10.5)
Creatinine, Ser: 1.3 mg/dL — ABNORMAL HIGH (ref 0.4–1.2)
GFR: 45.12 mL/min — ABNORMAL LOW (ref >60.00)
Sodium: 142 mEq/L (ref 135–145)

## 2013-04-02 MED ORDER — TRAZODONE HCL 50 MG PO TABS: 25.0000 mg | ORAL_TABLET | Freq: Every evening | ORAL | Status: DC | PRN

## 2013-04-03 ENCOUNTER — Other Ambulatory Visit: Payer: Self-pay | Admitting: Internal Medicine

## 2013-04-03 DIAGNOSIS — E876 Hypokalemia: Secondary | ICD-10-CM

## 2013-04-03 NOTE — Progress Notes (Signed)
Order placed for f/u potassium.  

## 2013-04-05 ENCOUNTER — Encounter: Payer: Self-pay | Admitting: Internal Medicine

## 2013-04-05 DIAGNOSIS — E876 Hypokalemia: Secondary | ICD-10-CM | POA: Insufficient documentation

## 2013-04-05 NOTE — Assessment & Plan Note (Signed)
Colonoscopy.  Persistent diarrhea.  Agreeable to referral back to GI.  Has upcoming appt.

## 2013-04-05 NOTE — Assessment & Plan Note (Signed)
On aspirin now.  No reoccurring problems.  Carotid ultrasound revealed no significant stenosis.  Did have changes consistent with fibromuscular dysplasia.  MRI brain revealed no acute abnormality.  Did have some chronic white matter changes.  Wanted to hold on neurology referral.  Did agree to referral to vascular surgery to further evaluate - fibromuscular dysplasia.  Planning for CTA.

## 2013-04-05 NOTE — Assessment & Plan Note (Signed)
Not taking any statin now.  Not on Welchol now.   Low cholesterol diet and exercise.  Check lipid panel with next fasting labs.

## 2013-04-05 NOTE — Assessment & Plan Note (Signed)
Doing better.  Main concern now is not being able to sleep well.  Will try trazodone as directed.  Follow.

## 2013-04-05 NOTE — Assessment & Plan Note (Signed)
On potassium supplements.  Recheck potassium.

## 2013-04-05 NOTE — Assessment & Plan Note (Signed)
Have decreased the triam/hctz to 1/2 tablet q day.  Keep hydrated.  Light headedness has resolved.  Follow.

## 2013-04-05 NOTE — Assessment & Plan Note (Signed)
Renal function stable.  Follow.  Stay hydrated.  Follow metabolic panel.  Cr has been stable - 1.3.

## 2013-04-05 NOTE — Assessment & Plan Note (Signed)
Reports the reflux is controlled.  Continue prilosec.   

## 2013-04-05 NOTE — Assessment & Plan Note (Signed)
See above.  Use xanax as directed.  Will start trazodone to help her sleep.  Follow.

## 2013-04-05 NOTE — Assessment & Plan Note (Signed)
S/P lobectomy.  Sees Dr Choksi.  Breathing stable.     

## 2013-04-05 NOTE — Progress Notes (Signed)
Subjective:    Patient ID: Victoria Hanson, female    DOB: 10/03/1950, 62 y.o.   MRN: [id number]  HPI 62 year old female with past history of colon cancer, lung cancer (s/p lobectomy), hypertension and hypercholesterolemia who comes in today as a scheduled follow up.   States she was evaluated in the ER on 12/13/12 for chest pain.  Was referred to Dr Mariah Milling.  States had stress test that was ok.  No further pain.  Breathing stable.  A previous visit had some concern regarding a possible TIA.  See that note for details.  Was started on aspirin.  Carotid ultrasound revealed no significant stenosis. Did reveal some changes possibly characteristic of fibromuscular dysplasia.  We discussed this today.  She was agreeable for referral to Vascular surgery to determine if any further w/up warranted.  They are planning to do what sounds like a CTA.   She also had MRI brain that revealed no acute abnormality.  Did have some chronic white matter changes.  Has not had reoccurring problems.  She does report some increased anxiety.  Did not tolerate wellbutrin.  Off now.  Doing ok.  Wants to continue with the xanax she is doing.  Feels her main issue now is not sleeping.  Wants something to help her sleep.  We discussed trazodone.   Eating and drinking well.  Is only on 1/2 hctz now.  Blood pressure doing well.     Past Medical History  Diagnosis Date  . Colon cancer 1999    Pt had partial colectomy. Did develop lung metastasis. Had right upper lobectomy in 01/2007 then had associated chemotherapy.  . Hypercholesterolemia   . Hypertension   . Depression   . Nephrolithiasis   . Elevated transaminase level     ? NASH  . GERD (gastroesophageal reflux disease)   . Holter monitor, abnormal 12/2009    NSR, rare PACs and PVCs  . Echocardiogram abnormal 02/2010    EF 55-60%, mild LAE, no regional wall motion abnormalities  . Lung cancer     S/P resection (lobectomy - right)  . COPD (chronic obstructive pulmonary  disease)     Current Outpatient Prescriptions on File Prior to Visit  Medication Sig Dispense Refill  . albuterol (PROVENTIL HFA;VENTOLIN HFA) 108 (90 BASE) MCG/ACT inhaler Inhale 2 puffs into the lungs every 6 (six) hours as needed for wheezing.  3.7 g  1  . ALPRAZolam (XANAX) 0.5 MG tablet Take 1 tablet (0.5 mg total) by mouth 3 (three) times daily as needed for anxiety.  90 tablet  0  . fluticasone (FLONASE) 50 MCG/ACT nasal spray Place 2 sprays into the nose daily.  16 g  0  . omeprazole (PRILOSEC OTC) 20 MG tablet Take 1 tablet (20 mg total) by mouth daily.  30 tablet  5  . potassium chloride SA (KLOR-CON M20) 20 MEQ tablet TAKE ONE (1) TABLET TWICE A DAY  60 tablet  5  . Probiotic Product (PHILLIPS COLON HEALTH) CAPS Take by mouth daily.      Marland Kitchen triamterene-hydrochlorothiazide (MAXZIDE-25) 37.5-25 MG per tablet Take 0.5 tablets by mouth daily.        No current facility-administered medications on file prior to visit.    Review of Systems Patient denies any headache.  Light headedness resolved.   No sinus or allergy symptoms.   No chest pain.  No chest tightness or palpitations.  No increased shortness of breath, cough or congestion.  No acid reflux.  No  nausea or vomiting.  No abdominal pain or cramping.   No BRBPR or melana.  Just taking alprazolam now.  See above.  Feels she needs to be on something to help her sleep.  Overall feels better.        Objective:   Physical Exam  Filed Vitals:   04/02/13 0845  BP: 130/80  Pulse: 71  Temp: 98 F (36.7 C)   Blood pressure recheck:  61/66  62 year old female in no acute distress.   HEENT:  Nares- clear.  Oropharynx - without lesions. NECK:  Supple.  Nontender.  No audible bruit.  HEART:  Appears to be regular. LUNGS:  No crackles or wheezing audible.  Respirations even and unlabored.  RADIAL PULSE:  Equal bilaterally.   ABDOMEN:  Soft, nontender.  Bowel sounds present and normal.  No audible abdominal bruit.    EXTREMITIES:   No increased edema present.  DP pulses palpable and equal bilaterally.            Assessment & Plan:  PREVIOUS HEADACHE.  Headache resolved.    PULMONARY.  Breathing stable.  Follow.    ABNORMAL MAMMOGRAM.  Saw Dr Michela Pitcher.  Had mammogram and follow up mammogram and ultrasound.  She just saw Dr Michela Pitcher.  States everything checked out fine.  Felt not further w/up warranted currently.  Follow.     HEALTH MAINTENANCE.  Physical 12/31/12.   Mammogram as outlined.  Last colonoscopy 2012.  Agreed to referral to GI, but wanted to wait until 9/14.  Was referred back given the persistent bowel change.  Has upcoming appt.

## 2013-04-06 ENCOUNTER — Ambulatory Visit: Payer: Self-pay | Admitting: Vascular Surgery

## 2013-04-07 ENCOUNTER — Ambulatory Visit: Payer: Self-pay | Admitting: Vascular Surgery

## 2013-04-27 ENCOUNTER — Ambulatory Visit: Payer: Medicare PPO | Admitting: Adult Health

## 2013-04-27 ENCOUNTER — Other Ambulatory Visit: Payer: Medicare PPO

## 2013-04-28 ENCOUNTER — Ambulatory Visit: Payer: Self-pay | Admitting: Oncology

## 2013-04-28 LAB — COMPREHENSIVE METABOLIC PANEL
Albumin: 3.4 g/dL (ref 3.4–5.0)
Alkaline Phosphatase: 101 U/L (ref 50–136)
BUN: 21 mg/dL — ABNORMAL HIGH (ref 7–18)
Bilirubin,Total: 0.3 mg/dL (ref 0.2–1.0)
Calcium, Total: 9.5 mg/dL (ref 8.5–10.1)
Chloride: 104 mmol/L (ref 98–107)
Co2: 27 mmol/L (ref 21–32)
Creatinine: 1.5 mg/dL — ABNORMAL HIGH (ref 0.60–1.30)
EGFR (African American): 43 — ABNORMAL LOW
Glucose: 132 mg/dL — ABNORMAL HIGH (ref 65–99)
Potassium: 4 mmol/L (ref 3.5–5.1)
SGOT(AST): 18 U/L (ref 15–37)
SGPT (ALT): 24 U/L (ref 12–78)
Total Protein: 7.4 g/dL (ref 6.4–8.2)

## 2013-04-28 LAB — CBC CANCER CENTER
Basophil #: 0.1 x10 3/mm (ref 0.0–0.1)
Basophil %: 0.7 %
Eosinophil %: 1.2 %
Lymphocyte #: 1.8 x10 3/mm (ref 1.0–3.6)
Lymphocyte %: 22.6 %
MCH: 28.4 pg (ref 26.0–34.0)
MCHC: 32.9 g/dL (ref 32.0–36.0)
Monocyte %: 4.1 %
Neutrophil #: 5.6 x10 3/mm (ref 1.4–6.5)
RBC: 4.24 10*6/uL (ref 3.80–5.20)

## 2013-04-29 LAB — CEA: CEA: 0.7 ng/mL (ref 0.0–4.7)

## 2013-04-30 ENCOUNTER — Other Ambulatory Visit: Payer: Self-pay | Admitting: Internal Medicine

## 2013-04-30 NOTE — Telephone Encounter (Signed)
Eprescribed.

## 2013-05-11 ENCOUNTER — Ambulatory Visit (INDEPENDENT_AMBULATORY_CARE_PROVIDER_SITE_OTHER): Payer: Medicare PPO | Admitting: Internal Medicine

## 2013-05-11 ENCOUNTER — Ambulatory Visit: Payer: Self-pay | Admitting: Oncology

## 2013-05-11 ENCOUNTER — Encounter: Payer: Self-pay | Admitting: Internal Medicine

## 2013-05-11 VITALS — BP 104/70 | HR 72 | Temp 98.4°F | Ht 64.0 in | Wt 188.8 lb

## 2013-05-11 DIAGNOSIS — I10 Essential (primary) hypertension: Secondary | ICD-10-CM

## 2013-05-11 DIAGNOSIS — F329 Major depressive disorder, single episode, unspecified: Secondary | ICD-10-CM

## 2013-05-11 DIAGNOSIS — G459 Transient cerebral ischemic attack, unspecified: Secondary | ICD-10-CM

## 2013-05-11 DIAGNOSIS — E876 Hypokalemia: Secondary | ICD-10-CM

## 2013-05-11 DIAGNOSIS — F419 Anxiety disorder, unspecified: Secondary | ICD-10-CM

## 2013-05-11 DIAGNOSIS — C3490 Malignant neoplasm of unspecified part of unspecified bronchus or lung: Secondary | ICD-10-CM

## 2013-05-11 DIAGNOSIS — N289 Disorder of kidney and ureter, unspecified: Secondary | ICD-10-CM

## 2013-05-11 DIAGNOSIS — F411 Generalized anxiety disorder: Secondary | ICD-10-CM

## 2013-05-11 LAB — BASIC METABOLIC PANEL
Calcium: 9.4 mg/dL (ref 8.4–10.5)
Chloride: 103 mEq/L (ref 96–112)
Creatinine, Ser: 1.3 mg/dL — ABNORMAL HIGH (ref 0.4–1.2)
Sodium: 140 mEq/L (ref 135–145)

## 2013-05-11 MED ORDER — TRAZODONE HCL 50 MG PO TABS: ORAL_TABLET | ORAL | Status: DC

## 2013-05-11 NOTE — Progress Notes (Signed)
Subjective:    Patient ID: Victoria Hanson, female    DOB: 01-12-1951, 62 y.o.   MRN: [id number]  Hypertension  62 year old female with past history of colon cancer, lung cancer (s/p lobectomy), hypertension and hypercholesterolemia who comes in today as a scheduled follow up.   States she was evaluated in the ER on 12/13/12 for chest pain.  Was referred to Dr Mariah Milling.  States had stress test that was ok.  No further pain.  Breathing stable.  A previous visit had some concern regarding a possible TIA.  See that note for details.  Was started on aspirin.  Carotid ultrasound revealed no significant stenosis.  Did reveal some changes possibly characteristic of fibromuscular dysplasia.  Saw vascular surgery.  Had CTA.  States everything checked out fine.   She also had MRI brain that revealed no acute abnormality.  Did have some chronic white matter changes.  Has not had reoccurring problems.  She does report some increased anxiety.  On trazodone now.  Sleeping better.  Feels she needs to be on 1 1/2 tablets.   Eating and drinking well.  Is only on 1/2 hctz now.  Blood pressure doing well.  Still with increased anxiety.  Increased stress with her sons medical issues.  Discussed at length with her today.  Uses xanax prn.     Past Medical History  Diagnosis Date  . Colon cancer 1999    Pt had partial colectomy. Did develop lung metastasis. Had right upper lobectomy in 01/2007 then had associated chemotherapy.  . Hypercholesterolemia   . Hypertension   . Depression   . Nephrolithiasis   . Elevated transaminase level     ? NASH  . GERD (gastroesophageal reflux disease)   . Holter monitor, abnormal 12/2009    NSR, rare PACs and PVCs  . Echocardiogram abnormal 02/2010    EF 55-60%, mild LAE, no regional wall motion abnormalities  . Lung cancer     S/P resection (lobectomy - right)  . COPD (chronic obstructive pulmonary disease)     Current Outpatient Prescriptions on File Prior to Visit  Medication  Sig Dispense Refill  . albuterol (PROVENTIL HFA;VENTOLIN HFA) 108 (90 BASE) MCG/ACT inhaler Inhale 2 puffs into the lungs every 6 (six) hours as needed for wheezing.  3.7 g  1  . ALPRAZolam (XANAX) 0.5 MG tablet Take 1 tablet (0.5 mg total) by mouth 3 (three) times daily as needed for anxiety.  90 tablet  0  . fluticasone (FLONASE) 50 MCG/ACT nasal spray Place 2 sprays into the nose daily.  16 g  0  . Multiple Vitamin (MULTIVITAMIN) tablet Take 1 tablet by mouth daily.      Marland Kitchen omeprazole (PRILOSEC OTC) 20 MG tablet Take 1 tablet (20 mg total) by mouth daily.  30 tablet  5  . potassium chloride SA (KLOR-CON M20) 20 MEQ tablet TAKE ONE (1) TABLET TWICE A DAY  60 tablet  5  . Probiotic Product (PHILLIPS COLON HEALTH) CAPS Take by mouth daily.      . traZODone (DESYREL) 50 MG tablet Take 0.5-1 tablets (25-50 mg total) by mouth at bedtime as needed for sleep.  30 tablet  1   No current facility-administered medications on file prior to visit.    Review of Systems Patient denies any headache.  Light headedness resolved.   No sinus or allergy symptoms.   No chest pain.  No chest tightness or palpitations.  No increased shortness of breath, cough or congestion.  No acid reflux.  No nausea or vomiting.  No abdominal pain or cramping.   No BRBPR or melana.  Increased anxiety as outlined.  See above.  On trazodone.  Helping her sleep.  Feels she needs to be on 1 1/2 tablets q hs.  Uses xanax prn.        Objective:   Physical Exam  Filed Vitals:   05/11/13 1400  BP: 104/70  Pulse: 72  Temp: 98.4 F (36.9 C)   Blood pressure recheck:  56/24  62 year old female in no acute distress.   HEENT:  Nares- clear.  Oropharynx - without lesions. NECK:  Supple.  Nontender.  No audible bruit.  HEART:  Appears to be regular. LUNGS:  No crackles or wheezing audible.  Respirations even and unlabored.  RADIAL PULSE:  Equal bilaterally.   ABDOMEN:  Soft, nontender.  Bowel sounds present and normal.  No audible  abdominal bruit.    EXTREMITIES:  No increased edema present.  DP pulses palpable and equal bilaterally.            Assessment & Plan:  PREVIOUS HEADACHE.  Headache resolved.    PULMONARY.  Breathing stable.  Follow.    ABNORMAL MAMMOGRAM.  Saw Dr Michela Pitcher.  Had mammogram and follow up mammogram and ultrasound.  She just saw Dr Michela Pitcher.  States everything checked out fine.  Felt no further w/up warranted currently.  Follow.     HEALTH MAINTENANCE.  Physical 12/31/12.   Mammogram as outlined.  Last colonoscopy 2012.  Agreed to referral to GI.  Was referred back given the persistent bowel change.  Has upcoming appt.

## 2013-05-11 NOTE — Progress Notes (Signed)
Pre-visit discussion using our clinic review tool. No additional management support is needed unless otherwise documented below in the visit note.  

## 2013-05-12 ENCOUNTER — Encounter: Payer: Self-pay | Admitting: *Deleted

## 2013-05-13 ENCOUNTER — Encounter: Payer: Self-pay | Admitting: Internal Medicine

## 2013-05-13 NOTE — Assessment & Plan Note (Signed)
Sleeping better.  Discussed the increased stress and her family issues.  Use xanax prn.  Follow.    

## 2013-05-13 NOTE — Assessment & Plan Note (Signed)
On aspirin now.  No reoccurring problems.  Carotid ultrasound revealed no significant stenosis.  Did have changes consistent with fibromuscular dysplasia.  MRI brain revealed no acute abnormality.  Did have some chronic white matter changes.  Wanted to hold on neurology referral.  Did agree to referral to vascular surgery to further evaluate - fibromuscular dysplasia.  Had CTA.  States everything checked out fine.  Currently doing well.  Follow.   

## 2013-05-13 NOTE — Assessment & Plan Note (Signed)
S/P lobectomy.  Sees Dr Doylene Canning.  Breathing stable.

## 2013-05-13 NOTE — Assessment & Plan Note (Signed)
On potassium supplements.  Recheck potassium.  

## 2013-05-13 NOTE — Assessment & Plan Note (Signed)
Have decreased the triam/hctz to 1/2 tablet q day.  Keep hydrated.  Light headedness has resolved.  Follow.  Blood pressure doing well.

## 2013-05-13 NOTE — Assessment & Plan Note (Signed)
See above.  Use xanax as directed.  Has tried various SSRIs.  On trazodone.  Sleeping better.  Follow.

## 2013-05-13 NOTE — Assessment & Plan Note (Signed)
Cr just checked at Dr Aleda Grana office - 1.5.  Slightly increased from her baseline 1.3.   On 1/2 HCTZ q day.  Stay hydrated.  Recheck metabolic panel today.

## 2013-06-17 ENCOUNTER — Ambulatory Visit: Payer: Medicare PPO | Admitting: Internal Medicine

## 2013-06-22 ENCOUNTER — Ambulatory Visit: Payer: Self-pay | Admitting: Adult Health

## 2013-06-23 ENCOUNTER — Ambulatory Visit (INDEPENDENT_AMBULATORY_CARE_PROVIDER_SITE_OTHER): Payer: Medicare PPO | Admitting: Internal Medicine

## 2013-06-23 ENCOUNTER — Encounter: Payer: Self-pay | Admitting: Internal Medicine

## 2013-06-23 VITALS — BP 120/80 | HR 73 | Temp 98.1°F | Ht 64.0 in | Wt 193.5 lb

## 2013-06-23 DIAGNOSIS — F419 Anxiety disorder, unspecified: Secondary | ICD-10-CM

## 2013-06-23 DIAGNOSIS — I10 Essential (primary) hypertension: Secondary | ICD-10-CM

## 2013-06-23 DIAGNOSIS — C189 Malignant neoplasm of colon, unspecified: Secondary | ICD-10-CM

## 2013-06-23 DIAGNOSIS — F411 Generalized anxiety disorder: Secondary | ICD-10-CM

## 2013-06-23 DIAGNOSIS — E876 Hypokalemia: Secondary | ICD-10-CM

## 2013-06-23 DIAGNOSIS — N289 Disorder of kidney and ureter, unspecified: Secondary | ICD-10-CM

## 2013-06-23 NOTE — Progress Notes (Signed)
Pre-visit discussion using our clinic review tool. No additional management support is needed unless otherwise documented below in the visit note.  

## 2013-06-25 ENCOUNTER — Telehealth: Payer: Self-pay | Admitting: Internal Medicine

## 2013-06-25 MED ORDER — TRAZODONE HCL 50 MG PO TABS: ORAL_TABLET | ORAL | Status: DC

## 2013-06-25 NOTE — Telephone Encounter (Signed)
Okay to refill now? Please advise

## 2013-06-25 NOTE — Telephone Encounter (Signed)
Pt needs refill of her trazodone due 07/16/13.  Does not want to run out.    [place] [phone]

## 2013-06-25 NOTE — Telephone Encounter (Signed)
Refilled trazodone (#45 with one refill)

## 2013-06-28 ENCOUNTER — Encounter: Payer: Self-pay | Admitting: Internal Medicine

## 2013-06-28 NOTE — Assessment & Plan Note (Signed)
Have decreased the triam/hctz to 1/2 tablet q day.  Keep hydrated.  Light headedness has resolved.  Follow.  Blood pressure was elevated for a short time.  Better now.  Currently asymptomatic.  Follow.  Hold on making any adjustments.

## 2013-06-28 NOTE — Progress Notes (Signed)
Subjective:    Patient ID: Victoria Hanson, female    DOB: 03-12-1951, 63 y.o.   MRN: [id number]  Hypertension  63 year old female with past history of colon cancer, lung cancer (s/p lobectomy), hypertension and hypercholesterolemia who comes in today as a work in with concerns regarding elevated blood pressure.  No chest pain.  Breathing stable.  Has noticed recently that her blood pressure was elevated.  States she feels her face gets hot.  States blood pressure was 163/101.  Better the last couple of days.  Taking her xanax for anxiety.  Taking trazodone to help her sleep.  Feels better today.  Blood pressure better.  No headache or dizziness.  Bowels stable.  Discussed cholesterol treatment.     Past Medical History  Diagnosis Date  . Colon cancer 1999    Pt had partial colectomy. Did develop lung metastasis. Had right upper lobectomy in 01/2007 then had associated chemotherapy.  . Hypercholesterolemia   . Hypertension   . Depression   . Nephrolithiasis   . Elevated transaminase level     ? NASH  . GERD (gastroesophageal reflux disease)   . Holter monitor, abnormal 12/2009    NSR, rare PACs and PVCs  . Echocardiogram abnormal 02/2010    EF 55-60%, mild LAE, no regional wall motion abnormalities  . Lung cancer     S/P resection (lobectomy - right)  . COPD (chronic obstructive pulmonary disease)     Current Outpatient Prescriptions on File Prior to Visit  Medication Sig Dispense Refill  . albuterol (PROVENTIL HFA;VENTOLIN HFA) 108 (90 BASE) MCG/ACT inhaler Inhale 2 puffs into the lungs every 6 (six) hours as needed for wheezing.  3.7 g  1  . ALPRAZolam (XANAX) 0.5 MG tablet Take 1 tablet (0.5 mg total) by mouth 3 (three) times daily as needed for anxiety.  90 tablet  0  . aspirin 81 MG tablet Take 81 mg by mouth daily.      . fluticasone (FLONASE) 50 MCG/ACT nasal spray Place 2 sprays into the nose daily.  16 g  0  . Multiple Vitamin (MULTIVITAMIN) tablet Take 1 tablet by mouth  daily.      Marland Kitchen omeprazole (PRILOSEC OTC) 20 MG tablet Take 1 tablet (20 mg total) by mouth daily.  30 tablet  5  . potassium chloride SA (KLOR-CON M20) 20 MEQ tablet TAKE ONE (1) TABLET TWICE A DAY  60 tablet  5  . triamterene-hydrochlorothiazide (MAXZIDE-25) 37.5-25 MG per tablet TAKE ONE-HALF TABLET EACH DAY      . Probiotic Product ([place]) CAPS Take by mouth daily.       No current facility-administered medications on file prior to visit.    Review of Systems Patient denies any headache.  Light headedness resolved.   No sinus or allergy symptoms.   No chest pain.  No chest tightness or palpitations.  No increased shortness of breath, cough or congestion.  No acid reflux.  No nausea or vomiting.  No abdominal pain or cramping.   No BRBPR or melana.  Increased anxiety as outlined.  See above.  On trazodone.  Helping her sleep.  Feels she needs to be on 1 1/2 tablets q hs.  Uses xanax prn.        Objective:   Physical Exam  Filed Vitals:   06/23/13 1559  BP: 120/80  Pulse: 73  Temp: 98.1 F (36.7 C)   Blood pressure recheck:  116/74 left arm and 118/72 right arm  63 year old female in no acute distress.   HEENT:  Nares- clear.  Oropharynx - without lesions. NECK:  Supple.  Nontender.  No audible bruit.  HEART:  Appears to be regular. LUNGS:  No crackles or wheezing audible.  Respirations even and unlabored.  RADIAL PULSE:  Equal bilaterally.   ABDOMEN:  Soft, nontender.  Bowel sounds present and normal.  No audible abdominal bruit.    EXTREMITIES:  No increased edema present.  DP pulses palpable and equal bilaterally.            Assessment & Plan:  PREVIOUS HEADACHE.  Headache resolved.    PULMONARY.  Breathing stable.  Follow.    ABNORMAL MAMMOGRAM.  Saw Dr Pat Patrick.  Had mammogram and follow up mammogram and ultrasound.  She just saw Dr Pat Patrick.  States everything checked out fine.  Felt no further w/up warranted currently.  Follow.     HEALTH MAINTENANCE.  Physical  12/31/12.   Mammogram as outlined.  Last colonoscopy 2012.  Agreed to referral to GI.  Was referred back given the persistent bowel change.  Plans for f/u colonoscopy soon.

## 2013-06-28 NOTE — Assessment & Plan Note (Signed)
On potassium supplements.  Follow potassium.

## 2013-06-28 NOTE — Assessment & Plan Note (Addendum)
Cr 1.3 last check.  On 1/2 HCTZ q day.  Stay hydrated.  Follow metabolic panel.

## 2013-06-28 NOTE — Assessment & Plan Note (Signed)
Saw GI.  Planning for colonoscopy soon.  Follow.

## 2013-06-28 NOTE — Assessment & Plan Note (Signed)
Use xanax as directed.  Has tried various SSRIs.  On trazodone.  Sleeping better.  Follow.

## 2013-07-06 ENCOUNTER — Telehealth: Payer: Self-pay | Admitting: Internal Medicine

## 2013-07-06 ENCOUNTER — Other Ambulatory Visit: Payer: Self-pay | Admitting: *Deleted

## 2013-07-06 MED ORDER — TRAZODONE HCL 50 MG PO TABS: 100.0000 mg | ORAL_TABLET | Freq: Every day | ORAL | Status: DC

## 2013-07-06 NOTE — Telephone Encounter (Signed)
Left detailed message on voicemail & sent in new Rx to pharmacy with updated directions & quantity

## 2013-07-06 NOTE — Telephone Encounter (Signed)
Please advise 

## 2013-07-06 NOTE — Telephone Encounter (Signed)
I am ok for her to increase the trazodone to 100mg  q hs.  Let us know if persistent problems.

## 2013-07-06 NOTE — Telephone Encounter (Signed)
Duplicate-see other phone message

## 2013-07-06 NOTE — Telephone Encounter (Signed)
The patient is wanting to go up on her trazodone form 75 mg to 100 mg . She is not sleeping at night.

## 2013-07-08 ENCOUNTER — Ambulatory Visit: Payer: Self-pay | Admitting: Unknown Physician Specialty

## 2013-07-08 LAB — HM COLONOSCOPY

## 2013-07-16 ENCOUNTER — Other Ambulatory Visit: Payer: Self-pay | Admitting: Internal Medicine

## 2013-07-16 ENCOUNTER — Encounter: Payer: Self-pay | Admitting: Internal Medicine

## 2013-08-03 ENCOUNTER — Other Ambulatory Visit: Payer: Self-pay | Admitting: *Deleted

## 2013-08-03 ENCOUNTER — Ambulatory Visit: Payer: Medicare PPO | Admitting: Internal Medicine

## 2013-08-03 ENCOUNTER — Telehealth: Payer: Self-pay | Admitting: Internal Medicine

## 2013-08-03 MED ORDER — TRAZODONE HCL 50 MG PO TABS: 100.0000 mg | ORAL_TABLET | Freq: Every day | ORAL | Status: DC

## 2013-08-03 NOTE — Telephone Encounter (Signed)
Rx refilled #60 no refills

## 2013-08-03 NOTE — Telephone Encounter (Signed)
Trazadone 50 mg twice daily.  Pt needs refill.  [facility]

## 2013-08-03 NOTE — Telephone Encounter (Signed)
Trazadone Rx refilled #60 with no additional refills

## 2013-08-09 ENCOUNTER — Encounter: Payer: Self-pay | Admitting: Internal Medicine

## 2013-08-09 DIAGNOSIS — C189 Malignant neoplasm of colon, unspecified: Secondary | ICD-10-CM

## 2013-08-18 ENCOUNTER — Ambulatory Visit: Payer: Medicare PPO | Admitting: Internal Medicine

## 2013-08-31 ENCOUNTER — Other Ambulatory Visit: Payer: Self-pay | Admitting: Internal Medicine

## 2013-09-04 ENCOUNTER — Ambulatory Visit: Payer: Medicare PPO | Admitting: Adult Health

## 2013-09-10 ENCOUNTER — Ambulatory Visit (INDEPENDENT_AMBULATORY_CARE_PROVIDER_SITE_OTHER): Payer: Medicare PPO | Admitting: Adult Health

## 2013-09-10 ENCOUNTER — Encounter: Payer: Self-pay | Admitting: Adult Health

## 2013-09-10 VITALS — BP 146/76 | HR 71 | Temp 98.0°F | Resp 14 | Wt 190.0 lb

## 2013-09-10 DIAGNOSIS — J019 Acute sinusitis, unspecified: Secondary | ICD-10-CM

## 2013-09-10 MED ORDER — MOMETASONE FUROATE 50 MCG/ACT NA SUSP: 2.0000 | Freq: Every day | NASAL | Status: DC

## 2013-09-10 MED ORDER — AMOXICILLIN-POT CLAVULANATE 875-125 MG PO TABS: 1.0000 | ORAL_TABLET | Freq: Two times a day (BID) | ORAL | Status: DC

## 2013-09-10 NOTE — Progress Notes (Signed)
   Subjective:    Patient ID: Victoria Hanson, female    DOB: 12-19-1950, 63 y.o.   MRN: [id number]  HPI  Pt presents with congestion, post nasal drip, cough. Symptoms x 2 weeks. She took Robitussin. Cough is improved.   Review of Systems  Constitutional: Positive for chills. Negative for fever.  HENT: Positive for congestion, postnasal drip, rhinorrhea and sinus pressure. Negative for sore throat.   Respiratory: Positive for cough (improved).   All other systems reviewed and are negative.       Objective:   Physical Exam  Constitutional: She is oriented to person, place, and time. She appears well-developed and well-nourished. No distress.  HENT:  Head: Normocephalic and atraumatic.  Right Ear: External ear normal.  Left Ear: External ear normal.  Mouth/Throat: No oropharyngeal exudate.  Cardiovascular: Normal rate, regular rhythm and normal heart sounds.  Exam reveals no gallop.   No murmur heard. Pulmonary/Chest: Effort normal and breath sounds normal. No respiratory distress. She has no wheezes. She has no rales.  Lymphadenopathy:    She has cervical adenopathy.  Neurological: She is alert and oriented to person, place, and time.  Psychiatric: She has a normal mood and affect. Her behavior is normal. Judgment and thought content normal.       Assessment & Plan:   1. Acute sinusitis with symptoms > 10 days Start Augmentin as directed. Nasonex as directed Start zyrtec at bedtime

## 2013-09-10 NOTE — Patient Instructions (Addendum)
  Start Augmentin one tablet twice a day for 7 days.  Start Nasonex 2 sprays into each nostril once a day.  Also start Zyrtec for allergies one tablet at bedtime.

## 2013-09-10 NOTE — Progress Notes (Signed)
Pre visit review using our clinic review tool, if applicable. No additional management support is needed unless otherwise documented below in the visit note. 

## 2013-10-06 ENCOUNTER — Encounter: Payer: Self-pay | Admitting: Internal Medicine

## 2013-10-06 ENCOUNTER — Ambulatory Visit (INDEPENDENT_AMBULATORY_CARE_PROVIDER_SITE_OTHER): Payer: Medicare PPO | Admitting: Internal Medicine

## 2013-10-06 VITALS — BP 110/60 | HR 71 | Temp 97.9°F | Ht 64.0 in | Wt 189.5 lb

## 2013-10-06 DIAGNOSIS — C3490 Malignant neoplasm of unspecified part of unspecified bronchus or lung: Secondary | ICD-10-CM

## 2013-10-06 DIAGNOSIS — N289 Disorder of kidney and ureter, unspecified: Secondary | ICD-10-CM

## 2013-10-06 DIAGNOSIS — I10 Essential (primary) hypertension: Secondary | ICD-10-CM

## 2013-10-06 DIAGNOSIS — Z9189 Other specified personal risk factors, not elsewhere classified: Secondary | ICD-10-CM

## 2013-10-06 DIAGNOSIS — F329 Major depressive disorder, single episode, unspecified: Secondary | ICD-10-CM

## 2013-10-06 DIAGNOSIS — F419 Anxiety disorder, unspecified: Secondary | ICD-10-CM

## 2013-10-06 DIAGNOSIS — F3289 Other specified depressive episodes: Secondary | ICD-10-CM

## 2013-10-06 DIAGNOSIS — C189 Malignant neoplasm of colon, unspecified: Secondary | ICD-10-CM

## 2013-10-06 DIAGNOSIS — E785 Hyperlipidemia, unspecified: Secondary | ICD-10-CM

## 2013-10-06 DIAGNOSIS — E876 Hypokalemia: Secondary | ICD-10-CM

## 2013-10-06 DIAGNOSIS — Z789 Other specified health status: Secondary | ICD-10-CM

## 2013-10-06 DIAGNOSIS — G459 Transient cerebral ischemic attack, unspecified: Secondary | ICD-10-CM

## 2013-10-06 DIAGNOSIS — F411 Generalized anxiety disorder: Secondary | ICD-10-CM

## 2013-10-06 DIAGNOSIS — K219 Gastro-esophageal reflux disease without esophagitis: Secondary | ICD-10-CM

## 2013-10-06 DIAGNOSIS — F32A Depression, unspecified: Secondary | ICD-10-CM

## 2013-10-06 LAB — BASIC METABOLIC PANEL
BUN: 17 mg/dL (ref 6–23)
CALCIUM: 9.8 mg/dL (ref 8.4–10.5)
CO2: 26 mEq/L (ref 19–32)
Chloride: 104 mEq/L (ref 96–112)
Creatinine, Ser: 1.3 mg/dL — ABNORMAL HIGH (ref 0.4–1.2)
GFR: 45.04 mL/min — AB (ref >60.00)
Glucose, Bld: 92 mg/dL (ref 70–99)
Potassium: 4.4 mEq/L (ref 3.5–5.1)
SODIUM: 139 meq/L (ref 135–145)

## 2013-10-06 LAB — HEPATIC FUNCTION PANEL
ALT: 12 U/L (ref 0–35)
AST: 17 U/L (ref 0–37)
Albumin: 3.9 g/dL (ref 3.5–5.2)
Alkaline Phosphatase: 73 U/L (ref 39–117)
BILIRUBIN TOTAL: 0.4 mg/dL (ref 0.3–1.2)
Bilirubin, Direct: 0 mg/dL (ref 0.0–0.3)
Total Protein: 7.3 g/dL (ref 6.0–8.3)

## 2013-10-06 LAB — LIPID PANEL
Cholesterol: 207 mg/dL — ABNORMAL HIGH (ref 0–200)
HDL: 49.2 mg/dL (ref >39.00)
LDL Cholesterol: 133 mg/dL — ABNORMAL HIGH (ref 0–99)
Total CHOL/HDL Ratio: 4
Triglycerides: 124 mg/dL (ref 0.0–149.0)
VLDL: 24.8 mg/dL (ref 0.0–40.0)

## 2013-10-06 LAB — TSH: TSH: 4.04 u[IU]/mL (ref 0.35–5.50)

## 2013-10-06 MED ORDER — POTASSIUM CHLORIDE CRYS ER 20 MEQ PO TBCR: 20.0000 meq | EXTENDED_RELEASE_TABLET | Freq: Two times a day (BID) | ORAL | Status: DC

## 2013-10-06 MED ORDER — ALPRAZOLAM 0.5 MG PO TABS
0.5000 mg | ORAL_TABLET | Freq: Two times a day (BID) | ORAL | Status: DC | PRN
Start: 2013-10-06 — End: 2014-01-07

## 2013-10-06 MED ORDER — TRAZODONE HCL 50 MG PO TABS: ORAL_TABLET | ORAL | Status: DC

## 2013-10-06 MED ORDER — MOMETASONE FUROATE 50 MCG/ACT NA SUSP: 2.0000 | Freq: Every day | NASAL | Status: DC

## 2013-10-06 MED ORDER — TRIAMTERENE-HCTZ 37.5-25 MG PO TABS: ORAL_TABLET | ORAL | Status: DC

## 2013-10-06 NOTE — Progress Notes (Signed)
Pre visit review using our clinic review tool, if applicable. No additional management support is needed unless otherwise documented below in the visit note. 

## 2013-10-07 ENCOUNTER — Encounter: Payer: Self-pay | Admitting: *Deleted

## 2013-10-10 ENCOUNTER — Encounter: Payer: Self-pay | Admitting: Internal Medicine

## 2013-10-10 NOTE — Assessment & Plan Note (Signed)
Sleeping better.  Discussed the increased stress and her family issues.  Use xanax prn.  Follow.

## 2013-10-10 NOTE — Assessment & Plan Note (Signed)
Reports the reflux is controlled.  Continue prilosec.

## 2013-10-10 NOTE — Assessment & Plan Note (Signed)
Cr 1.3 last check.  On 1/2 HCTZ q day.  Stay hydrated.  Follow metabolic panel.

## 2013-10-10 NOTE — Assessment & Plan Note (Signed)
On potassium supplements.  Follow potassium.

## 2013-10-10 NOTE — Assessment & Plan Note (Signed)
Colonoscopy as outlined.  Recommended f/u colonoscopy in three years.  Will be due 2018.

## 2013-10-10 NOTE — Assessment & Plan Note (Signed)
Not taking any statin now.  On Welchol, but not taking as directed.  Plans to start.  Low cholesterol diet and exercise.  Check lipid panel with next fasting labs.

## 2013-10-10 NOTE — Progress Notes (Signed)
Subjective:    Patient ID: Victoria Hanson, female    DOB: June 07, 1951, 63 y.o.   MRN: [id number]  HPI 63 year old female with past history of colon cancer, lung cancer (s/p lobectomy), hypertension and hypercholesterolemia who comes in today as a scheduled follow up.   States she was evaluated in the ER on 12/13/12 for chest pain.  Was referred to Dr Rockey Situ.  States had stress test that was ok.  No further pain.  Breathing stable.  A previous visit had some concern regarding a possible TIA.  See that note for details.  Was started on aspirin.  Carotid ultrasound revealed no significant stenosis. Did reveal some changes possibly characteristic of fibromuscular dysplasia.  She was referred to Vascular surgery to determine if any further w/up warranted.  Had a CTA.   States was clear.  She also had MRI brain that revealed no acute abnormality.  Did have some chronic white matter changes.  Has not had reoccurring problems.  She does have issues with increased anxiety.  Doing ok.  Wants to continue with the xanax she is doing.  Taking trazodone to help her sleep.  Tolerating.  Eating and drinking well.  Is only on 1/2 hctz now.  Blood pressure doing well.  Using zyrtec and nasonex as needed to help with allergies.  Doing better.     Past Medical History  Diagnosis Date  . Colon cancer 1999    Pt had partial colectomy. Did develop lung metastasis. Had right upper lobectomy in 01/2007 then had associated chemotherapy.  . Hypercholesterolemia   . Hypertension   . Depression   . Nephrolithiasis   . Elevated transaminase level     ? NASH  . GERD (gastroesophageal reflux disease)   . Holter monitor, abnormal 12/2009    NSR, rare PACs and PVCs  . Echocardiogram abnormal 02/2010    EF 55-60%, mild LAE, no regional wall motion abnormalities  . Lung cancer     S/P resection (lobectomy - right)  . COPD (chronic obstructive pulmonary disease)     Current Outpatient Prescriptions on File Prior to Visit   Medication Sig Dispense Refill  . aspirin 81 MG tablet Take 81 mg by mouth daily.      . Multiple Vitamin (MULTIVITAMIN) tablet Take 1 tablet by mouth daily.      Marland Kitchen omeprazole (PRILOSEC OTC) 20 MG tablet Take 1 tablet (20 mg total) by mouth daily.  30 tablet  5  . PROAIR HFA 108 (90 BASE) MCG/ACT inhaler INHALE TWO PUFFS INTO THE LUNGS EVERY 6 HOURS AS NEEDED FOR WHEEZING  8.5 g  1  . Probiotic Product ([place]) CAPS Take by mouth daily.       No current facility-administered medications on file prior to visit.    Review of Systems Patient denies any headache.  Light headedness resolved.   No sinus or allergy symptoms.   No chest pain.  No chest tightness or palpitations.  No increased shortness of breath, cough or congestion.  No acid reflux.  No nausea or vomiting.  No abdominal pain or cramping.   No BRBPR or melana.  Just taking alprazolam now.  See above.  Feels she needs to be on something to help her sleep.  Overall feels better.        Objective:   Physical Exam  Filed Vitals:   10/06/13 0921  BP: 110/60  Pulse: 71  Temp: 97.9 F (36.6 C)   Blood pressure recheck:  79/75  63 year old female in no acute distress.   HEENT:  Nares- clear.  Oropharynx - without lesions. NECK:  Supple.  Nontender.  No audible bruit.  HEART:  Appears to be regular. LUNGS:  No crackles or wheezing audible.  Respirations even and unlabored.  RADIAL PULSE:  Equal bilaterally.   ABDOMEN:  Soft, nontender.  Bowel sounds present and normal.  No audible abdominal bruit.    EXTREMITIES:  No increased edema present.  DP pulses palpable and equal bilaterally.            Assessment & Plan:  PREVIOUS HEADACHE.  Headache resolved.    PULMONARY.  Breathing stable.  Follow.    ABNORMAL MAMMOGRAM.  Saw Dr Pat Patrick.  Had mammogram and follow up mammogram and ultrasound.  She saw Dr Pat Patrick in follow up.   States everything checked out fine.  Felt no further w/up warranted currently.  Follow.      HEALTH MAINTENANCE.  Physical 12/31/12.   Mammogram as outlined.  Last colonoscopy 07/08/13 revealed internal hemorrhoids.  Recommended f/u in three years.

## 2013-10-10 NOTE — Assessment & Plan Note (Signed)
Have decreased the triam/hctz to 1/2 tablet q day.  Keep hydrated.  Light headedness has resolved.  Follow.  Blood pressure doing well.   Currently asymptomatic.  Follow.

## 2013-10-10 NOTE — Assessment & Plan Note (Signed)
S/P lobectomy.  Sees Dr Oliva Bustard.  Breathing stable.  Planning to f/u with Dr Oliva Bustard 10/26/13.  Will have f/u cxr then.

## 2013-10-10 NOTE — Assessment & Plan Note (Signed)
Use xanax as directed.  Has tried various SSRIs.  On trazodone.  Sleeping better.  Follow.

## 2013-10-10 NOTE — Assessment & Plan Note (Signed)
On aspirin now.  No reoccurring problems.  Carotid ultrasound revealed no significant stenosis.  Did have changes consistent with fibromuscular dysplasia.  MRI brain revealed no acute abnormality.  Did have some chronic white matter changes.  Wanted to hold on neurology referral.  Did agree to referral to vascular surgery to further evaluate - fibromuscular dysplasia.  Had CTA.  States everything checked out fine.  Currently doing well.  Follow.

## 2013-10-23 ENCOUNTER — Ambulatory Visit: Payer: Self-pay | Admitting: Oncology

## 2013-10-23 IMAGING — CT CT MAXILLOFACIAL WITHOUT CONTRAST
1 series · 16 of 30 positions shown, 20 images · non-contrast
Comparison: none

REASON FOR EXAM: bilat zygoma pain, relief after surgical removal right
max tooth
COMMENTS:

PROCEDURE:     CT  - CT MAXILLOFACIAL AREA WO  - February 28, 2012  [DATE]
RESULT:     Comparison: None.
TECHNIQUE: Multiple axial images were obtained of the paranasal sinuses,
without intravenous contrast. Coronal reformats were performed.

[Series 2: facial 3.0 h60f · axial · 0.34mm/px · z∈[+303,+450]mm · 16 of 53 slices shown, 20 images]
[im 2/53  brain]
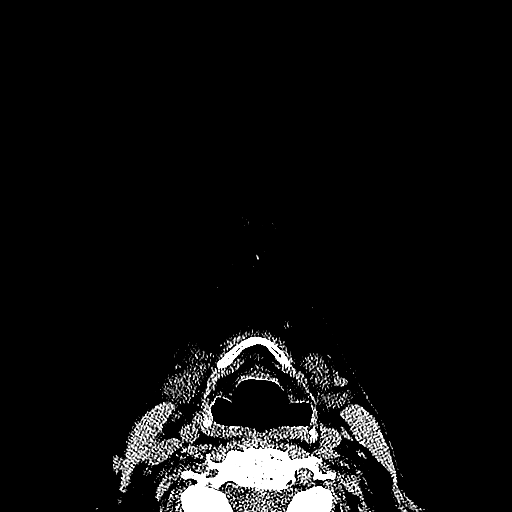
[im 2/53  bone]
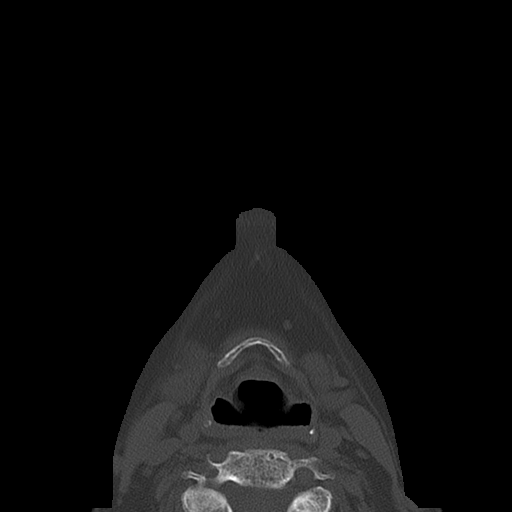
[im 6/53  bone]
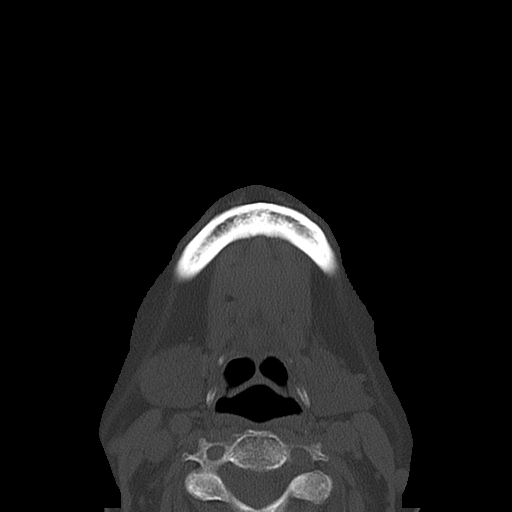
[im 9/53  bone]
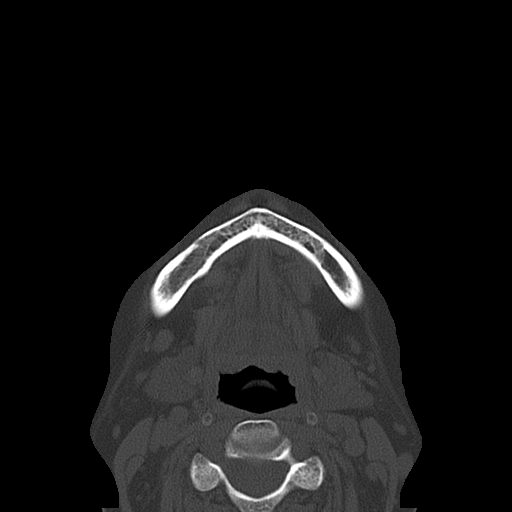
[im 13/53  bone]
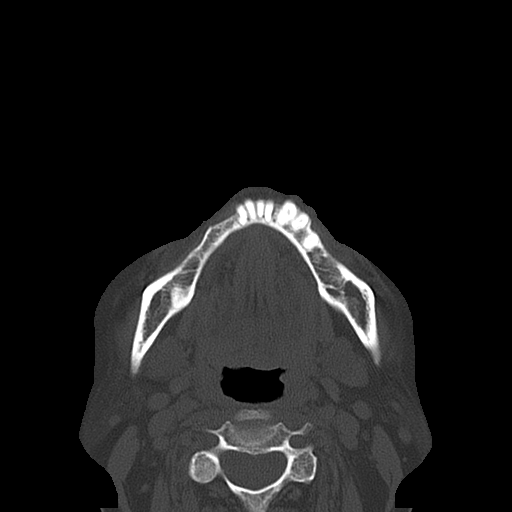
[im 15/53  brain]
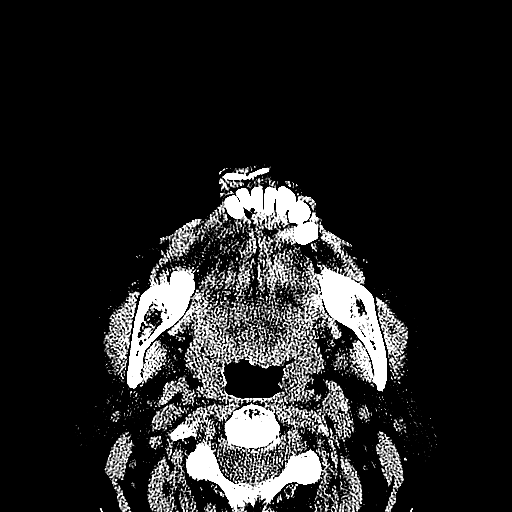
[im 15/53  bone]
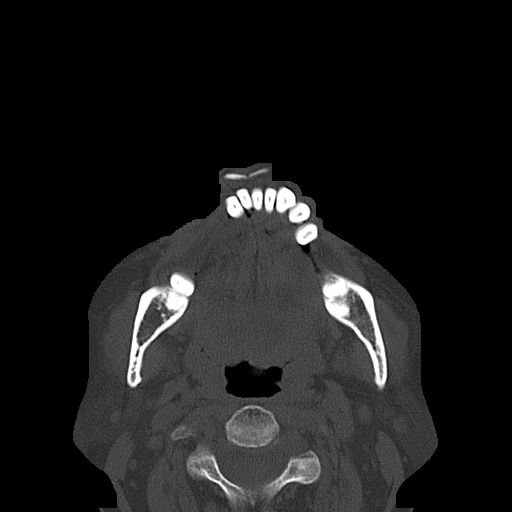
[im 18/53  bone]
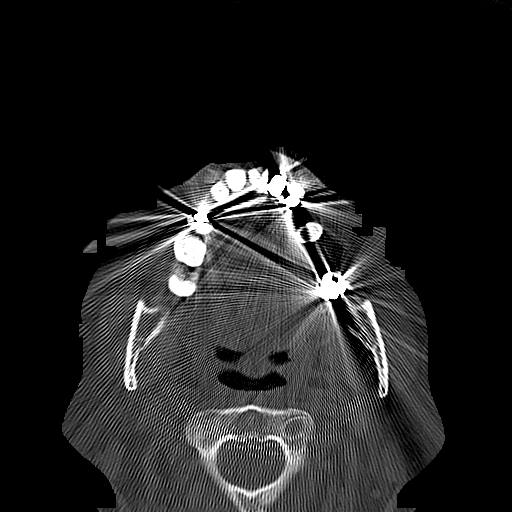
[im 22/53  bone]
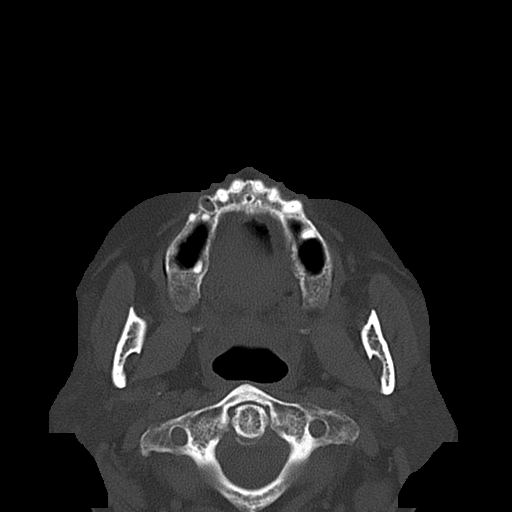
[im 26/53  bone]
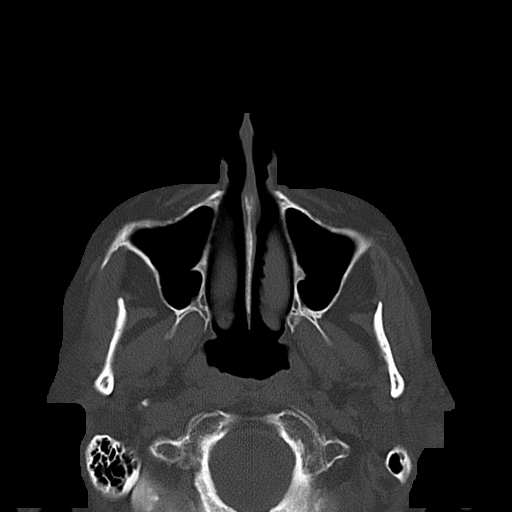
[im 27/53  brain]
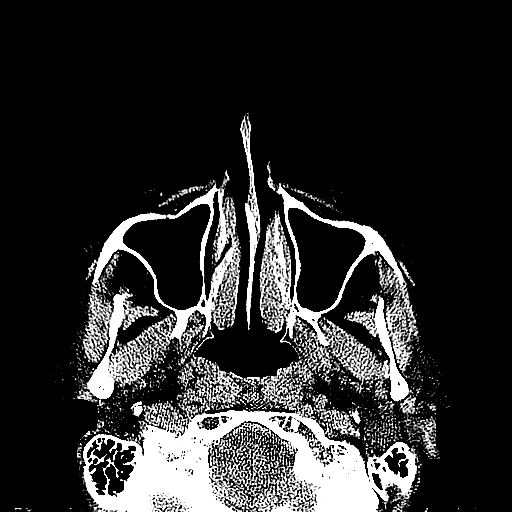
[im 27/53  bone]
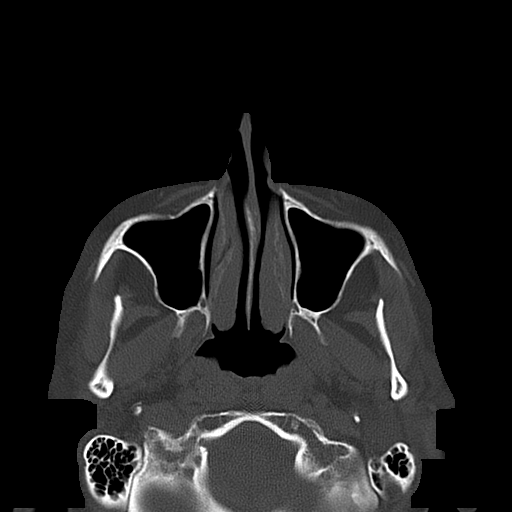
[im 31/53  bone]
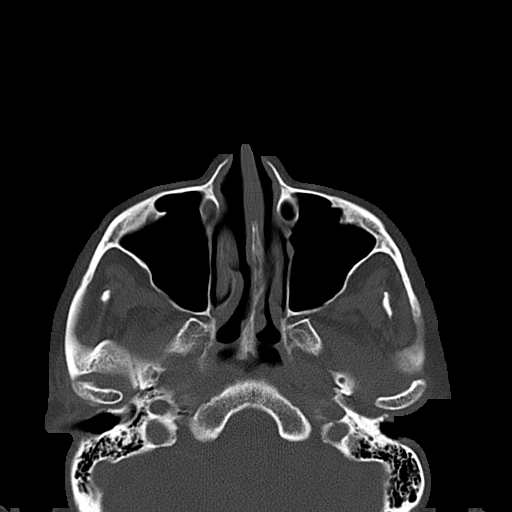
[im 35/53  bone]
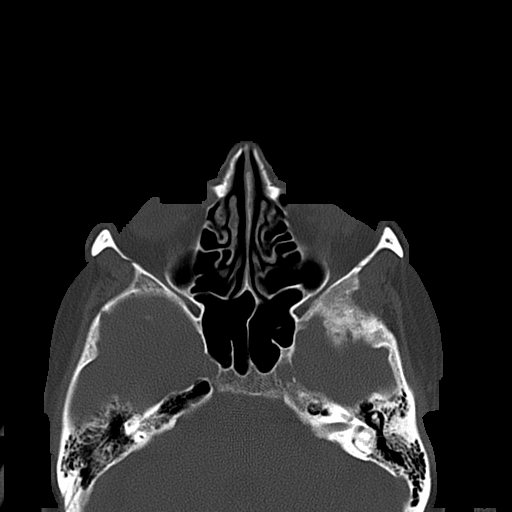
[im 38/53  bone]
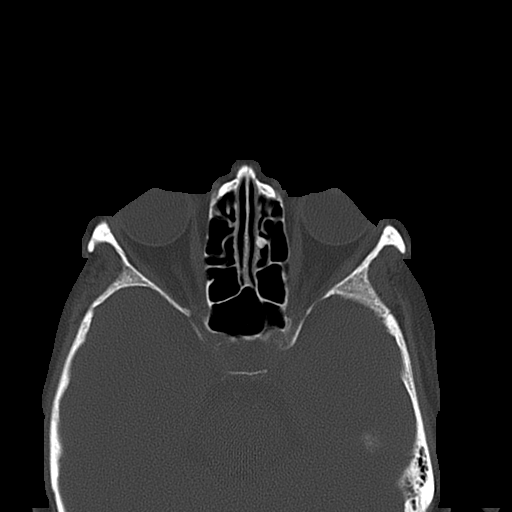
[im 40/53  brain]
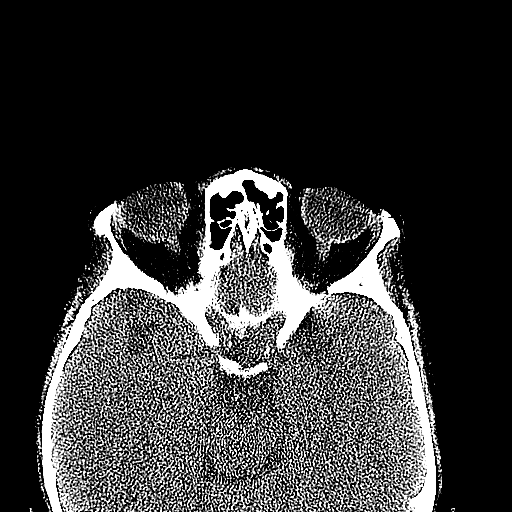
[im 40/53  bone]
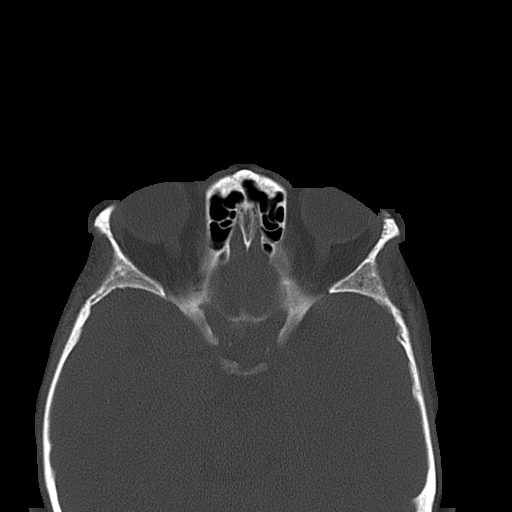
[im 44/53  bone]
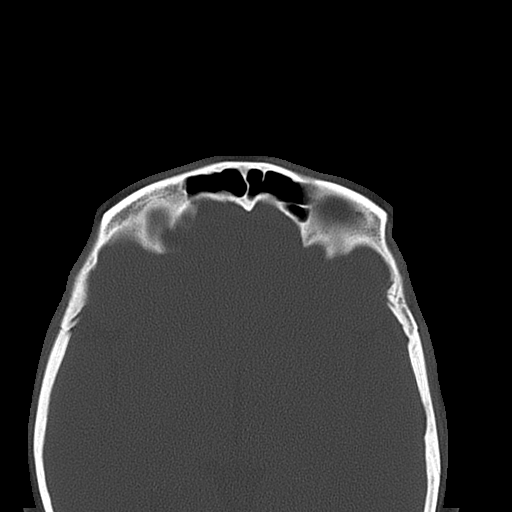
[im 47/53  bone]
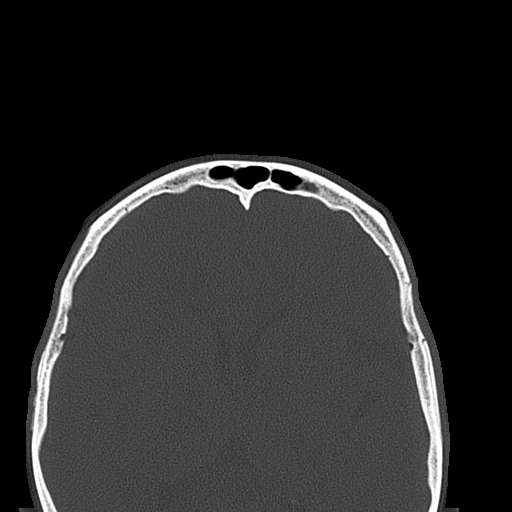
[im 51/53  bone]
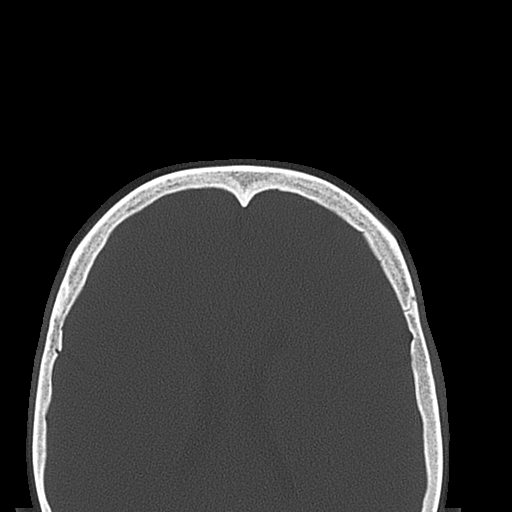

[16 of 30 positions shown; findings below may reference images not displayed]

FINDINGS: The paranasal sinuses are well aerated. The mastoid air cells are well
aerated. There is lucency in the right side of the anterior maxilla
consistent with residua of a removed tooth. The ostiomeatal complexes are
patent. There is minimal flattening of the mandibular condyles which could
related to early degenerative changes.
IMPRESSION: 1. No acute findings. There is focal lucency in the right side of the
maxilla consistent with a removed tooth.
2. Minimal flattening of the mandibular condyles may related to early
degenerative changes.

## 2013-10-26 LAB — CBC CANCER CENTER
Basophil #: 0.1 x10 3/mm (ref 0.0–0.1)
Basophil %: 1 %
Eosinophil #: 0.1 x10 3/mm (ref 0.0–0.7)
Eosinophil %: 1.3 %
HCT: 35 % (ref 35.0–47.0)
HGB: 11.9 g/dL — AB (ref 12.0–16.0)
Lymphocyte #: 1.4 x10 3/mm (ref 1.0–3.6)
Lymphocyte %: 21.1 %
MCH: 28.6 pg (ref 26.0–34.0)
MCHC: 33.9 g/dL (ref 32.0–36.0)
MCV: 84 fL (ref 80–100)
MONOS PCT: 5.5 %
Monocyte #: 0.4 x10 3/mm (ref 0.2–0.9)
Neutrophil #: 4.8 x10 3/mm (ref 1.4–6.5)
Neutrophil %: 71.1 %
Platelet: 221 x10 3/mm (ref 150–440)
RBC: 4.15 10*6/uL (ref 3.80–5.20)
RDW: 13.3 % (ref 11.5–14.5)
WBC: 6.8 x10 3/mm (ref 3.6–11.0)

## 2013-10-26 LAB — COMPREHENSIVE METABOLIC PANEL
ALBUMIN: 3.3 g/dL — AB (ref 3.4–5.0)
ALK PHOS: 88 U/L
ALT: 19 U/L (ref 12–78)
ANION GAP: 7 (ref 7–16)
BILIRUBIN TOTAL: 0.3 mg/dL (ref 0.2–1.0)
BUN: 19 mg/dL — ABNORMAL HIGH (ref 7–18)
CO2: 31 mmol/L (ref 21–32)
Calcium, Total: 9.3 mg/dL (ref 8.5–10.1)
Chloride: 104 mmol/L (ref 98–107)
Creatinine: 1.31 mg/dL — ABNORMAL HIGH (ref 0.60–1.30)
EGFR (African American): 50 — ABNORMAL LOW
EGFR (Non-African Amer.): 43 — ABNORMAL LOW
GLUCOSE: 107 mg/dL — AB (ref 65–99)
Osmolality: 286 (ref 275–301)
POTASSIUM: 4.2 mmol/L (ref 3.5–5.1)
SGOT(AST): 15 U/L (ref 15–37)
Sodium: 142 mmol/L (ref 136–145)
TOTAL PROTEIN: 7.2 g/dL (ref 6.4–8.2)

## 2013-10-27 LAB — CEA: CEA: 0.9 ng/mL (ref 0.0–4.7)

## 2013-11-09 ENCOUNTER — Ambulatory Visit: Payer: Self-pay | Admitting: Oncology

## 2013-11-10 ENCOUNTER — Ambulatory Visit: Payer: Self-pay | Admitting: Surgery

## 2013-11-10 LAB — HM MAMMOGRAPHY

## 2013-12-14 ENCOUNTER — Ambulatory Visit (INDEPENDENT_AMBULATORY_CARE_PROVIDER_SITE_OTHER): Payer: Medicare PPO | Admitting: Adult Health

## 2013-12-14 ENCOUNTER — Encounter: Payer: Self-pay | Admitting: Adult Health

## 2013-12-14 VITALS — BP 130/72 | HR 73 | Temp 98.3°F | Resp 14 | Wt 192.5 lb

## 2013-12-14 DIAGNOSIS — N390 Urinary tract infection, site not specified: Secondary | ICD-10-CM

## 2013-12-14 LAB — POCT URINALYSIS DIPSTICK
BILIRUBIN UA: NEGATIVE
Glucose, UA: NEGATIVE
KETONES UA: NEGATIVE
Nitrite, UA: POSITIVE
Protein, UA: NEGATIVE
Spec Grav, UA: 1.02
Urobilinogen, UA: 0.2
pH, UA: 6

## 2013-12-14 MED ORDER — AMOXICILLIN-POT CLAVULANATE 500-125 MG PO TABS: 1.0000 | ORAL_TABLET | Freq: Two times a day (BID) | ORAL | Status: DC

## 2013-12-14 NOTE — Patient Instructions (Addendum)
  Start Augmentin 500/125 twice daily for 10 days. I am sending the urine for culture. We will notify you of results once available.   Urinary Tract Infection Urinary tract infections (UTIs) can develop anywhere along your urinary tract. Your urinary tract is your body's drainage system for removing wastes and extra water. Your urinary tract includes two kidneys, two ureters, a bladder, and a urethra. Your kidneys are a pair of bean-shaped organs. Each kidney is about the size of your fist. They are located below your ribs, one on each side of your spine. CAUSES Infections are caused by microbes, which are microscopic organisms, including fungi, viruses, and bacteria. These organisms are so small that they can only be seen through a microscope. Bacteria are the microbes that most commonly cause UTIs. SYMPTOMS  Symptoms of UTIs may vary by age and gender of the patient and by the location of the infection. Symptoms in young women typically include a frequent and intense urge to urinate and a painful, burning feeling in the bladder or urethra during urination. Older women and men are more likely to be tired, shaky, and weak and have muscle aches and abdominal pain. A fever may mean the infection is in your kidneys. Other symptoms of a kidney infection include pain in your back or sides below the ribs, nausea, and vomiting. DIAGNOSIS To diagnose a UTI, your caregiver will ask you about your symptoms. Your caregiver also will ask to provide a urine sample. The urine sample will be tested for bacteria and white blood cells. White blood cells are made by your body to help fight infection. TREATMENT  Typically, UTIs can be treated with medication. Because most UTIs are caused by a bacterial infection, they usually can be treated with the use of antibiotics. The choice of antibiotic and length of treatment depend on your symptoms and the type of bacteria causing your infection. HOME CARE INSTRUCTIONS  If you  were prescribed antibiotics, take them exactly as your caregiver instructs you. Finish the medication even if you feel better after you have only taken some of the medication.  Drink enough water and fluids to keep your urine clear or pale yellow.  Avoid caffeine, tea, and carbonated beverages. They tend to irritate your bladder.  Empty your bladder often. Avoid holding urine for long periods of time.  Empty your bladder before and after sexual intercourse.  After a bowel movement, women should cleanse from front to back. Use each tissue only once. SEEK MEDICAL CARE IF:   You have back pain.  You develop a fever.  Your symptoms do not begin to resolve within 3 days. SEEK IMMEDIATE MEDICAL CARE IF:   You have severe back pain or lower abdominal pain.  You develop chills.  You have nausea or vomiting.  You have continued burning or discomfort with urination. MAKE SURE YOU:   Understand these instructions.  Will watch your condition.  Will get help right away if you are not doing well or get worse. Document Released: 03/07/2005 Document Revised: 11/27/2011 Document Reviewed: 07/06/2011 [facility] Patient Information 2015 [place], [place]. This information is not intended to replace advice given to you by your health care provider. Make sure you discuss any questions you have with your health care provider.

## 2013-12-14 NOTE — Progress Notes (Signed)
Patient ID: Victoria Hanson, female   DOB: June 03, 1951, 63 y.o.   MRN: [id number]   Subjective:    Patient ID: Victoria Hanson, female    DOB: 12/19/1950, 63 y.o.   MRN: [id number]  HPI  Pt is a 63 y/o female who presents to clinic with concerns that she may have a uti. Symptoms of dysuria, frequency and urgency have been ongoing x 1 week. Denies fever or chills.   Past Medical History  Diagnosis Date  . Colon cancer 1999    Pt had partial colectomy. Did develop lung metastasis. Had right upper lobectomy in 01/2007 then had associated chemotherapy.  . Hypercholesterolemia   . Hypertension   . Depression   . Nephrolithiasis   . Elevated transaminase level     ? NASH  . GERD (gastroesophageal reflux disease)   . Holter monitor, abnormal 12/2009    NSR, rare PACs and PVCs  . Echocardiogram abnormal 02/2010    EF 55-60%, mild LAE, no regional wall motion abnormalities  . Lung cancer     S/P resection (lobectomy - right)  . COPD (chronic obstructive pulmonary disease)     Current Outpatient Prescriptions on File Prior to Visit  Medication Sig Dispense Refill  . ALPRAZolam (XANAX) 0.5 MG tablet Take 1 tablet (0.5 mg total) by mouth 2 (two) times daily as needed for anxiety.  90 tablet  0  . aspirin 81 MG tablet Take 81 mg by mouth daily.      . colesevelam (WELCHOL) 625 MG tablet Take 1,875 mg by mouth 2 (two) times daily with a meal.      . mometasone (NASONEX) 50 MCG/ACT nasal spray Place 2 sprays into the nose daily.  17 g  6  . Multiple Vitamin (MULTIVITAMIN) tablet Take 1 tablet by mouth daily.      Marland Kitchen omeprazole (PRILOSEC OTC) 20 MG tablet Take 1 tablet (20 mg total) by mouth daily.  30 tablet  5  . potassium chloride SA (K-DUR,KLOR-CON) 20 MEQ tablet Take 1 tablet (20 mEq total) by mouth 2 (two) times daily.  60 tablet  2  . PROAIR HFA 108 (90 BASE) MCG/ACT inhaler INHALE TWO PUFFS INTO THE LUNGS EVERY 6 HOURS AS NEEDED FOR WHEEZING  8.5 g  1  . Probiotic Product ([place]) CAPS Take by mouth daily.      . traZODone (DESYREL) 50 MG tablet Take 2 1/2 tablets q hs  90 tablet  2  . triamterene-hydrochlorothiazide (MAXZIDE-25) 37.5-25 MG per tablet 1/2 tablet q day  30 tablet  2   No current facility-administered medications on file prior to visit.     Review of Systems  Constitutional: Negative for fever and chills.  Genitourinary: Positive for dysuria, urgency and frequency. Negative for hematuria and flank pain.  All other systems reviewed and are negative.      Objective:  BP 130/72  Pulse 73  Temp(Src) 98.3 F (36.8 C) (Oral)  Resp 14  Wt 192 lb 8 oz (87.317 kg)  SpO2 98%   Physical Exam  Constitutional: She is oriented to person, place, and time. No distress.  Cardiovascular: Normal rate and regular rhythm.   Pulmonary/Chest: Effort normal. No respiratory distress.  Musculoskeletal: Normal range of motion.  Neurological: She is alert and oriented to person, place, and time.  Skin: Skin is warm and dry.  Psychiatric: She has a normal mood and affect. Her behavior is normal. Judgment and thought content normal.  Assessment & Plan:   1. Urinary tract infection, site not specified Positive nitrites and 3+ leukocytes on UA. Send for urine culture. Start Augmentin 500/125 mg bid x 10 days.  - POCT urinalysis dipstick - Urine culture

## 2013-12-14 NOTE — Progress Notes (Signed)
Pre visit review using our clinic review tool, if applicable. No additional management support is needed unless otherwise documented below in the visit note. 

## 2013-12-17 LAB — URINE CULTURE: Colony Count: >=100000

## 2013-12-30 ENCOUNTER — Other Ambulatory Visit: Payer: Self-pay | Admitting: Internal Medicine

## 2014-01-07 ENCOUNTER — Ambulatory Visit (INDEPENDENT_AMBULATORY_CARE_PROVIDER_SITE_OTHER): Payer: Medicare PPO | Admitting: Internal Medicine

## 2014-01-07 ENCOUNTER — Encounter: Payer: Self-pay | Admitting: Internal Medicine

## 2014-01-07 VITALS — BP 110/72 | HR 63 | Temp 98.5°F | Ht 64.0 in | Wt 195.5 lb

## 2014-01-07 DIAGNOSIS — C3490 Malignant neoplasm of unspecified part of unspecified bronchus or lung: Secondary | ICD-10-CM

## 2014-01-07 DIAGNOSIS — F419 Anxiety disorder, unspecified: Secondary | ICD-10-CM

## 2014-01-07 DIAGNOSIS — F32A Depression, unspecified: Secondary | ICD-10-CM

## 2014-01-07 DIAGNOSIS — C189 Malignant neoplasm of colon, unspecified: Secondary | ICD-10-CM

## 2014-01-07 DIAGNOSIS — E785 Hyperlipidemia, unspecified: Secondary | ICD-10-CM

## 2014-01-07 DIAGNOSIS — K219 Gastro-esophageal reflux disease without esophagitis: Secondary | ICD-10-CM

## 2014-01-07 DIAGNOSIS — F411 Generalized anxiety disorder: Secondary | ICD-10-CM

## 2014-01-07 DIAGNOSIS — I10 Essential (primary) hypertension: Secondary | ICD-10-CM

## 2014-01-07 DIAGNOSIS — F3289 Other specified depressive episodes: Secondary | ICD-10-CM

## 2014-01-07 DIAGNOSIS — F329 Major depressive disorder, single episode, unspecified: Secondary | ICD-10-CM

## 2014-01-07 DIAGNOSIS — E876 Hypokalemia: Secondary | ICD-10-CM

## 2014-01-07 DIAGNOSIS — G459 Transient cerebral ischemic attack, unspecified: Secondary | ICD-10-CM

## 2014-01-07 DIAGNOSIS — N289 Disorder of kidney and ureter, unspecified: Secondary | ICD-10-CM

## 2014-01-07 LAB — LIPID PANEL
CHOLESTEROL: 205 mg/dL — AB (ref 0–200)
HDL: 44.1 mg/dL (ref >39.00)
LDL CALC: 123 mg/dL — AB (ref 0–99)
NONHDL: 160.9
Total CHOL/HDL Ratio: 5
Triglycerides: 188 mg/dL — ABNORMAL HIGH (ref 0.0–149.0)
VLDL: 37.6 mg/dL (ref 0.0–40.0)

## 2014-01-07 LAB — URINALYSIS, ROUTINE W REFLEX MICROSCOPIC
Bilirubin Urine: NEGATIVE
Hgb urine dipstick: NEGATIVE
Ketones, ur: NEGATIVE
Nitrite: NEGATIVE
Total Protein, Urine: NEGATIVE
Urine Glucose: NEGATIVE
Urobilinogen, UA: 0.2 (ref 0.0–1.0)
pH: 7 (ref 5.0–8.0)

## 2014-01-07 LAB — BASIC METABOLIC PANEL
BUN: 21 mg/dL (ref 6–23)
CO2: 25 mEq/L (ref 19–32)
Calcium: 9.3 mg/dL (ref 8.4–10.5)
Chloride: 106 mEq/L (ref 96–112)
Creatinine, Ser: 1.3 mg/dL — ABNORMAL HIGH (ref 0.4–1.2)
GFR: 44.6 mL/min — AB (ref >60.00)
GLUCOSE: 104 mg/dL — AB (ref 70–99)
POTASSIUM: 4 meq/L (ref 3.5–5.1)
SODIUM: 139 meq/L (ref 135–145)

## 2014-01-07 LAB — HEPATIC FUNCTION PANEL
ALK PHOS: 71 U/L (ref 39–117)
ALT: 13 U/L (ref 0–35)
AST: 22 U/L (ref 0–37)
Albumin: 3.7 g/dL (ref 3.5–5.2)
BILIRUBIN TOTAL: 0.4 mg/dL (ref 0.2–1.2)
Bilirubin, Direct: 0 mg/dL (ref 0.0–0.3)
Total Protein: 7 g/dL (ref 6.0–8.3)

## 2014-01-07 MED ORDER — ALPRAZOLAM 0.5 MG PO TABS: 0.5000 mg | ORAL_TABLET | Freq: Every day | ORAL | Status: DC | PRN

## 2014-01-07 NOTE — Progress Notes (Addendum)
Subjective:    Patient ID: Victoria Hanson, female    DOB: 1950/06/24, 63 y.o.   MRN: [id number]  HPI 63 year old female with past history of colon cancer, lung cancer (s/p lobectomy), hypertension and hypercholesterolemia who comes in today to follow up on these issues as well as for a complete physical exam.  States she was evaluated in the ER on 12/13/12 for chest pain.  Was referred to Victoria Hanson.  States had stress test that was ok.  No further pain.  Breathing stable.  A previous visit had some concern regarding a possible TIA.  See that note for details.  Was started on aspirin.  Carotid ultrasound revealed no significant stenosis. Did reveal some changes possibly characteristic of fibromuscular dysplasia.  She was referred to Victoria Hanson to determine if any further w/up warranted.  Had a CTA.   States was clear.  She also had MRI brain that revealed no acute abnormality.  Did have some chronic white matter changes.  Has not had reoccurring problems.  She does have issues with increased anxiety.  Doing ok.  Wants to continue with the xanax she is doing.  Taking these prn.  Not requiring as often.   Taking trazodone to help her sleep.  Tolerating.  Eating and drinking well.   Blood pressure doing well.  Using zyrtec and nasonex as needed to help with allergies.  Doing better.     Past Medical History  Diagnosis Date  . Colon cancer 1999    Pt had partial colectomy. Did develop lung metastasis. Had right upper lobectomy in 01/2007 then had associated chemotherapy.  . Hypercholesterolemia   . Hypertension   . Depression   . Nephrolithiasis   . Elevated transaminase level     ? NASH  . GERD (gastroesophageal reflux disease)   . Holter monitor, abnormal 12/2009    NSR, rare PACs and PVCs  . Echocardiogram abnormal 02/2010    EF 55-60%, mild LAE, no regional wall motion abnormalities  . Lung cancer     S/P resection (lobectomy - right)  . COPD (chronic obstructive pulmonary disease)      Current Outpatient Prescriptions on File Prior to Visit  Medication Sig Dispense Refill  . ALPRAZolam (XANAX) 0.5 MG tablet Take 1 tablet (0.5 mg total) by mouth 2 (two) times daily as needed for anxiety.  90 tablet  0  . aspirin 81 MG tablet Take 81 mg by mouth daily.      . mometasone (NASONEX) 50 MCG/ACT nasal spray Place 2 sprays into the nose daily.  17 g  6  . Multiple Vitamin (MULTIVITAMIN) tablet Take 1 tablet by mouth daily.      Marland Kitchen omeprazole (PRILOSEC OTC) 20 MG tablet Take 1 tablet (20 mg total) by mouth daily.  30 tablet  5  . potassium chloride SA (K-DUR,KLOR-CON) 20 MEQ tablet Take 1 tablet (20 mEq total) by mouth 2 (two) times daily.  60 tablet  2  . PROAIR HFA 108 (90 BASE) MCG/ACT inhaler INHALE TWO PUFFS INTO THE LUNGS EVERY 6 HOURS AS NEEDED FOR WHEEZING  8.5 g  3  . Probiotic Product ([place]) CAPS Take by mouth daily.      . traZODone (DESYREL) 50 MG tablet Take 2 1/2 tablets q hs  90 tablet  2  . triamterene-hydrochlorothiazide (MAXZIDE-25) 37.5-25 MG per tablet 1/2 tablet q day  30 tablet  2   No current facility-administered medications on file prior to visit.  Review of Systems Patient denies any headache.  Light headedness resolved.   No sinus or allergy symptoms.   No chest pain.  No chest tightness or palpitations.  No increased shortness of breath, cough or congestion.  No acid reflux.  No nausea or vomiting.  No abdominal pain or cramping.   No BRBPR or melana.  Just taking alprazolam now prn.  See above.  Taking trazodone. Overall feels better.   Still seeing Victoria Hanson.       Objective:   Physical Exam  Filed Vitals:   01/07/14 0903  BP: 120/70  Pulse: 63  Temp: 98.5 F (36.9 C)   Blood pressure recheck:  110/72 left, 118/72 right  63 year old female in no acute distress.   HEENT:  Nares- clear.  Oropharynx - without lesions. NECK:  Supple.  Nontender.  No audible bruit.  HEART:  Appears to be regular. LUNGS:  No crackles or  wheezing audible.  Respirations even and unlabored.  RADIAL PULSE:  Equal bilaterally.    BREASTS:  No nipple discharge or nipple retraction present.  Could not appreciate any distinct nodules or axillary adenopathy.  ABDOMEN:  Soft, nontender.  Bowel sounds present and normal.  No audible abdominal bruit.  GU:  Not performed.    EXTREMITIES:  No increased edema present.  DP pulses palpable and equal bilaterally.           Assessment & Plan:  PREVIOUS HEADACHE.  Headache resolved.    PULMONARY.  Breathing stable.  Follow.    ABNORMAL MAMMOGRAM.  Saw Victoria Hanson.  Had mammogram and follow up mammogram and ultrasound.  She saw Victoria Hanson in follow up.   States everything checked out fine.  Felt no further w/up warranted currently.  Follow.   States had follow up mammogram one to two months ago - ok.    HEALTH MAINTENANCE.  Physical today.   Mammogram as outlined.  Last colonoscopy 07/08/13 revealed internal hemorrhoids.  Recommended f/u in three years.     I spent 25 minutes with the patient and more than 50% of the time was spent in consultation regarding thea above.    Addendum - urine culture returned positive for 25,000 E. Coli.  Discussed with ID.  Urinalysis ok - no white blood cells.  Hold on abx.

## 2014-01-07 NOTE — Progress Notes (Signed)
Pre visit review using our clinic review tool, if applicable. No additional management support is needed unless otherwise documented below in the visit note. 

## 2014-01-08 ENCOUNTER — Encounter: Payer: Self-pay | Admitting: *Deleted

## 2014-01-10 ENCOUNTER — Encounter: Payer: Self-pay | Admitting: Internal Medicine

## 2014-01-10 NOTE — Assessment & Plan Note (Signed)
Reports the reflux is controlled.  Continue prilosec.

## 2014-01-10 NOTE — Assessment & Plan Note (Addendum)
Cr 1.3 last check.  On 1/2 HCTZ q day.  Stay hydrated.  Follow metabolic panel.

## 2014-01-10 NOTE — Assessment & Plan Note (Signed)
Use xanax as directed.  Has tried various SSRIs.  On trazodone.  Sleeping better.  Follow.

## 2014-01-10 NOTE — Assessment & Plan Note (Signed)
Have decreased the triam/hctz to 1/2 tablet q day.  Keep hydrated.  Light headedness has resolved.  Follow.  Blood pressure doing well.   Currently asymptomatic.  Follow.

## 2014-01-10 NOTE — Assessment & Plan Note (Signed)
On potassium supplements.  Follow potassium.

## 2014-01-10 NOTE — Assessment & Plan Note (Signed)
On aspirin now.  No reoccurring problems.  Carotid ultrasound revealed no significant stenosis.  Did have changes consistent with fibromuscular dysplasia.  MRI brain revealed no acute abnormality.  Did have some chronic white matter changes.  She wanted to hold on neurology referral.  Did agree to referral to vascular surgery to further evaluate - fibromuscular dysplasia.  Had CTA.  States everything checked out fine.  Currently doing well.  Follow.

## 2014-01-10 NOTE — Assessment & Plan Note (Addendum)
Sleeping better.  Discussed the increased stress and her family issues.  Use xanax prn.  Follow.  Overall doing better.

## 2014-01-10 NOTE — Assessment & Plan Note (Signed)
S/P lobectomy.  Sees Dr Oliva Bustard.  Breathing stable.  Continue to f/u with Dr Oliva Bustard.

## 2014-01-10 NOTE — Assessment & Plan Note (Signed)
Not taking any statin now.  Was on Welchol.  Wants to go back on cholestyramine for her bowels.  Plans to start.  Low cholesterol diet and exercise.  Check lipid panel with next fasting labs.

## 2014-01-10 NOTE — Assessment & Plan Note (Signed)
Colonoscopy as outlined.  Recommended f/u colonoscopy in three years.  Will be due 2018.

## 2014-01-11 ENCOUNTER — Other Ambulatory Visit: Payer: Self-pay | Admitting: Internal Medicine

## 2014-01-11 LAB — CULTURE, URINE COMPREHENSIVE

## 2014-01-26 ENCOUNTER — Encounter: Payer: Self-pay | Admitting: Internal Medicine

## 2014-02-12 ENCOUNTER — Other Ambulatory Visit: Payer: Self-pay | Admitting: Internal Medicine

## 2014-02-22 ENCOUNTER — Telehealth: Payer: Self-pay | Admitting: Internal Medicine

## 2014-02-22 NOTE — Telephone Encounter (Signed)
Victoria Hanson has openings this afternoon. We have nothing here until Wed.

## 2014-02-22 NOTE — Telephone Encounter (Signed)
Attempted to get in touch with the patient at 10:20 to inform her of appointments at [facility] . She did not pick up and her voice mail has not been set up.

## 2014-02-22 NOTE — Telephone Encounter (Signed)
?   Sinus infection needing an appointment with Dr. Nicki Reaper.

## 2014-02-22 NOTE — Telephone Encounter (Signed)
Pt has appt tomorrow with Raquel

## 2014-02-23 ENCOUNTER — Ambulatory Visit (INDEPENDENT_AMBULATORY_CARE_PROVIDER_SITE_OTHER): Payer: Medicare PPO | Admitting: Adult Health

## 2014-02-23 ENCOUNTER — Encounter: Payer: Self-pay | Admitting: Adult Health

## 2014-02-23 VITALS — BP 140/84 | HR 61 | Temp 98.1°F | Resp 14 | Wt 195.5 lb

## 2014-02-23 DIAGNOSIS — J018 Other acute sinusitis: Secondary | ICD-10-CM

## 2014-02-23 MED ORDER — AMOXICILLIN-POT CLAVULANATE 875-125 MG PO TABS: 1.0000 | ORAL_TABLET | Freq: Two times a day (BID) | ORAL | Status: DC

## 2014-02-23 NOTE — Progress Notes (Signed)
Pre visit review using our clinic review tool, if applicable. No additional management support is needed unless otherwise documented below in the visit note. 

## 2014-02-23 NOTE — Patient Instructions (Signed)
  Start Augmentin 1 tablet twice a day for 7 days.  Continue your nasocort nasal spray.  You may also try saline nasal spray. This is just salt water solution and you can use as often as you like to help irrigate your sinuses.  You may take ibuprofen 1 tablet every 6 hours as needed for headache, fever or body aches.  Call if you are not any better by Friday.

## 2014-02-23 NOTE — Progress Notes (Signed)
Patient ID: Victoria Hanson, female   DOB: Aug 31, 1950, 63 y.o.   MRN: [id number]   Subjective:    Patient ID: Victoria Hanson, female    DOB: 1950/08/10, 63 y.o.   MRN: [id number]  HPI  Pt is a 63 y/o female who presents with sinus pressure, HA, cough, post nasal drip. Symptoms ongoing x 1 week.   Past Medical History  Diagnosis Date  . Colon cancer 1999    Pt had partial colectomy. Did develop lung metastasis. Had right upper lobectomy in 01/2007 then had associated chemotherapy.  . Hypercholesterolemia   . Hypertension   . Depression   . Nephrolithiasis   . Elevated transaminase level     ? NASH  . GERD (gastroesophageal reflux disease)   . Holter monitor, abnormal 12/2009    NSR, rare PACs and PVCs  . Echocardiogram abnormal 02/2010    EF 55-60%, mild LAE, no regional wall motion abnormalities  . Lung cancer     S/P resection (lobectomy - right)  . COPD (chronic obstructive pulmonary disease)     Current Outpatient Prescriptions on File Prior to Visit  Medication Sig Dispense Refill  . ALPRAZolam (XANAX) 0.5 MG tablet Take 1 tablet (0.5 mg total) by mouth daily as needed for anxiety.  90 tablet  0  . aspirin 81 MG tablet Take 81 mg by mouth daily.      . mometasone (NASONEX) 50 MCG/ACT nasal spray Place 2 sprays into the nose daily.  17 g  6  . Multiple Vitamin (MULTIVITAMIN) tablet Take 1 tablet by mouth daily.      Marland Kitchen omeprazole (PRILOSEC OTC) 20 MG tablet Take 1 tablet (20 mg total) by mouth daily.  30 tablet  5  . omeprazole (PRILOSEC) 20 MG capsule TAKE ONE (1) CAPSULE EACH DAY  30 capsule  2  . omeprazole (PRILOSEC) 20 MG capsule TAKE ONE (1) CAPSULE EACH DAY  30 capsule  5  . potassium chloride SA (K-DUR,KLOR-CON) 20 MEQ tablet TAKE ONE TABLET TWICE DAILY  60 tablet  5  . PROAIR HFA 108 (90 BASE) MCG/ACT inhaler INHALE TWO PUFFS INTO THE LUNGS EVERY 6 HOURS AS NEEDED FOR WHEEZING  8.5 g  3  . Probiotic Product ([place]) CAPS Take by mouth daily.      .  traZODone (DESYREL) 50 MG tablet TAKE TWO AND ONE-HALF TABLETS AT BEDTIME  90 tablet  1  . triamterene-hydrochlorothiazide (MAXZIDE-25) 37.5-25 MG per tablet 1/2 tablet q day  30 tablet  2   No current facility-administered medications on file prior to visit.     Review of Systems  Constitutional: Negative for fever and chills.  HENT: Positive for congestion, postnasal drip, rhinorrhea and sinus pressure. Negative for sneezing and sore throat.   Respiratory: Positive for cough. Negative for wheezing.        Objective:  BP 140/84  Pulse 61  Temp(Src) 98.1 F (36.7 C) (Oral)  Resp 14  Wt 195 lb 8 oz (88.678 kg)  SpO2 99%   Physical Exam  Constitutional: She is oriented to person, place, and time. She appears well-developed and well-nourished. No distress.  HENT:  Head: Normocephalic and atraumatic.  Right Ear: External ear normal.  Left Ear: External ear normal.  Mouth/Throat: No oropharyngeal exudate.  Cardiovascular: Normal rate, regular rhythm and normal heart sounds.  Exam reveals no gallop.   No murmur heard. Pulmonary/Chest: Effort normal and breath sounds normal. No respiratory distress. She has no wheezes. She has  no rales.  Lymphadenopathy:    She has cervical adenopathy.  Neurological: She is alert and oriented to person, place, and time.  Psychiatric: She has a normal mood and affect. Her behavior is normal. Judgment and thought content normal.      Assessment & Plan:   1. Other acute sinusitis Augmentin bid x 7 days Nasocort nasal spay Saline spray Ibuprofen for headache and body aches RTC if no improvement by Friday

## 2014-03-05 IMAGING — MG MM CAD SCREENING MAMMO
1 series · 5 of 5 positions shown · non-contrast
Comparison: none

REASON FOR EXAM: SCR MAMMO NO ORDER
COMMENTS:

[R CC · right · 5 of 5 slices shown]
[im 1/5]
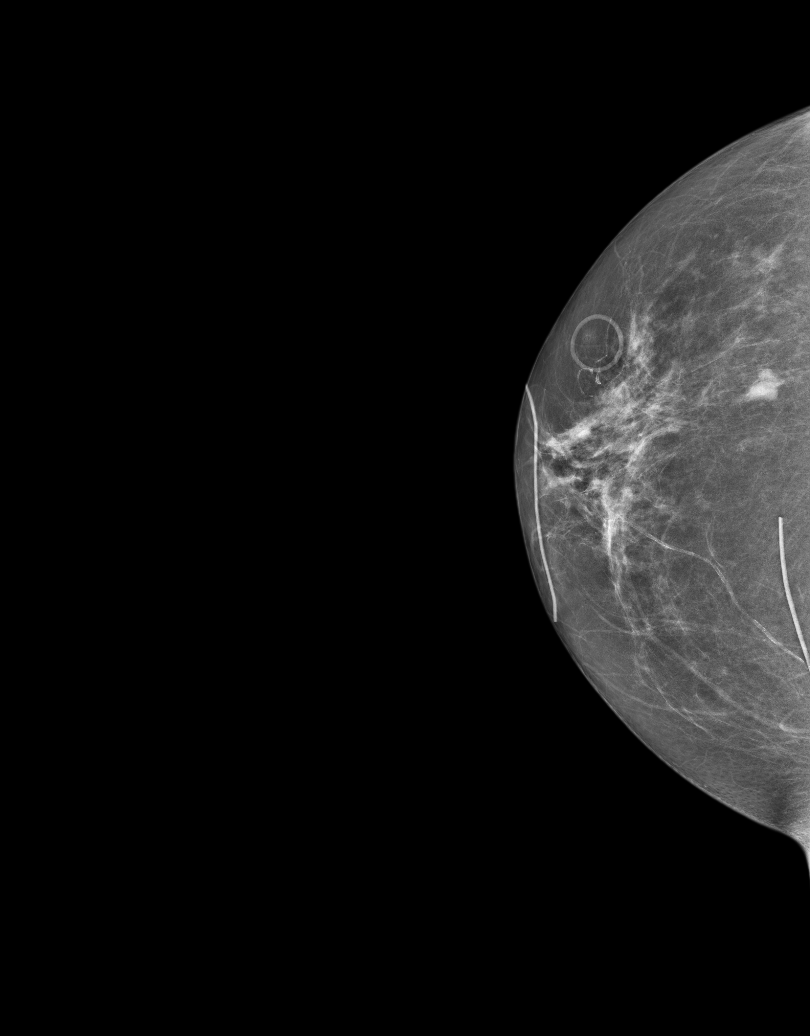
[im 2/5]
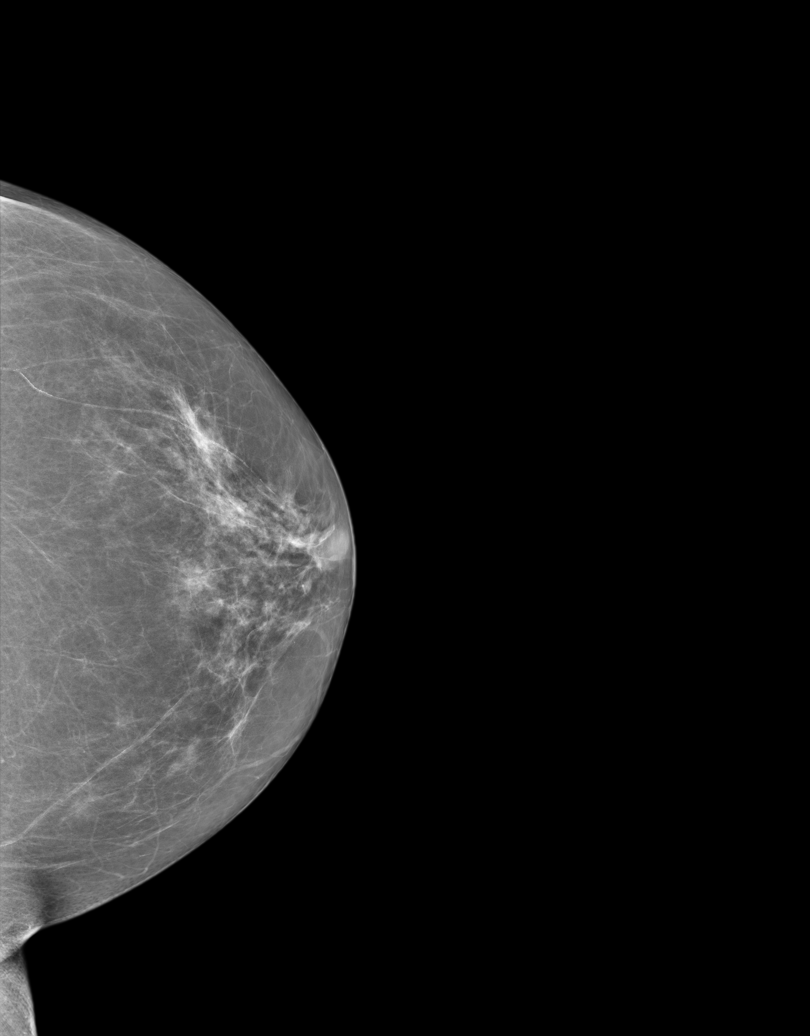
[im 3/5]
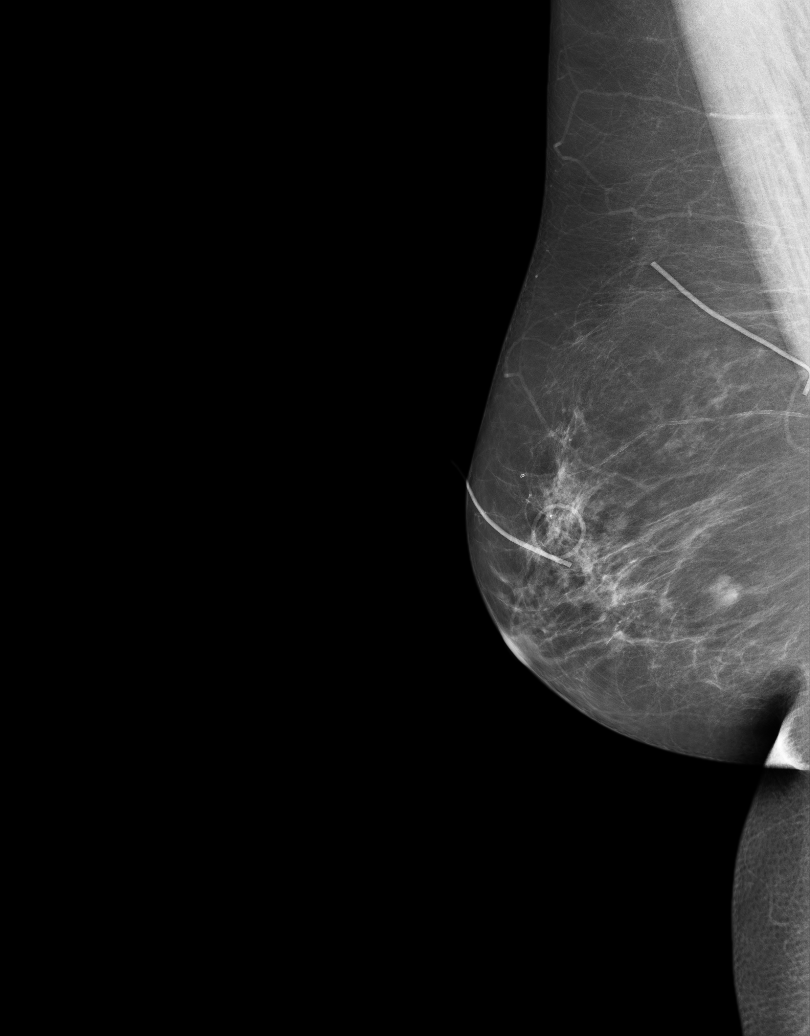
[im 4/5]
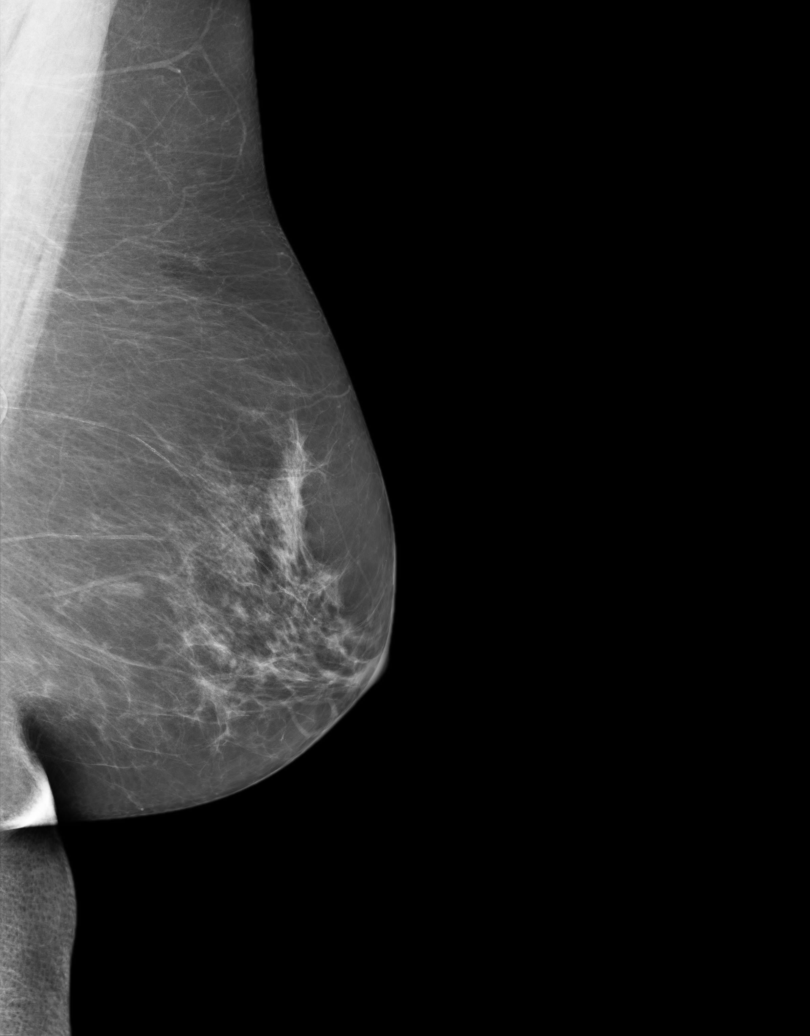
[im 5/5]
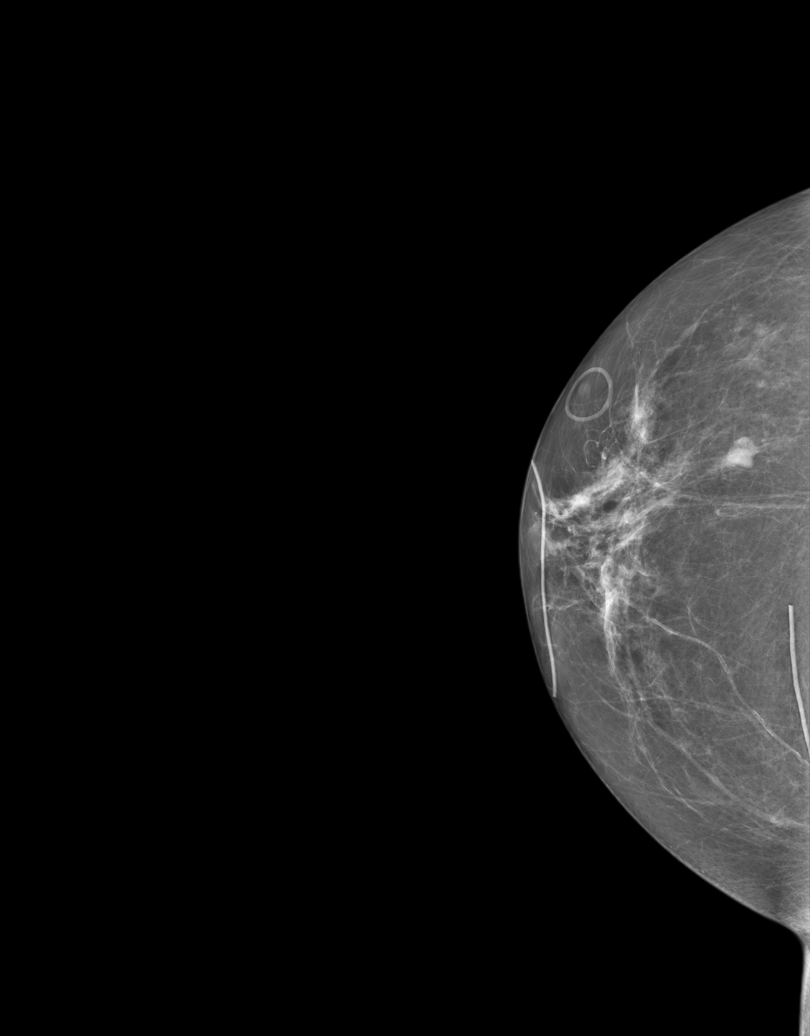

[5 of 5 positions shown; findings below may reference images not displayed]

PROCEDURE:     MAM - MAM DGTL SCRN MAM NO ORDER W/CAD  - July 10, 2012 [DATE]

RESULT:     The patient has a history of colon cancer and 6255 and 1005. The
patient has a history of previous negative right breast lumpectomy and September 2009. There is a family history of breast cancer and the patient's niece.
Comparison is made to previous digital mammographic images 07 June, 2011,22 May, 2010 and [REDACTED]. Postsurgical changes are seen
on the right with scar marker in the anterior May 2006 region. There is some
nodular density in projecting near the level of the nipple to slightly below
it and laterally in the posterior right breast. The this appears to be
slightly more prominent than the most recent images. No definite malignant
calcification is appreciated. Additional magnification compression views of
this area are recommended. Study is otherwise unchanged.
IMPRESSION: Slightly irregular nodular density near the level of the
nipple slightly lateral in the posterior right breast for which additional
magnification compression views are recommended.

BREAST COMPOSITION: The breast composition is SCATTERED FIBROGLANDULAR
TISSUE (glandular tissue is 25-50%)

BI-RADS: Catagory 0 - Need Additional Imaging Evaluation

A NEGATIVE MAMMOGRAM REPORT DOES NOT PRECLUDE BIOPSY OR OTHER EVALUATION OF
A CLINICALLY PALPABLE OR OTHERWISE SUSPICIOUS MASS OR LESION. BREAST CANCER
MAY NOT BE DETECTED IN UP TO 10% OF CASES.

[REDACTED]

## 2014-03-16 IMAGING — US ULTRASOUND RIGHT BREAST
1 series · 14 of 25 positions shown · non-contrast
Comparison: none

REASON FOR EXAM: RT BR NODULAR DENSITY
COMMENTS:

[Series 1: ultrasound right breast · 0.08mm/px · 30 acquisitions, 14 frames shown]
[im 1/30]
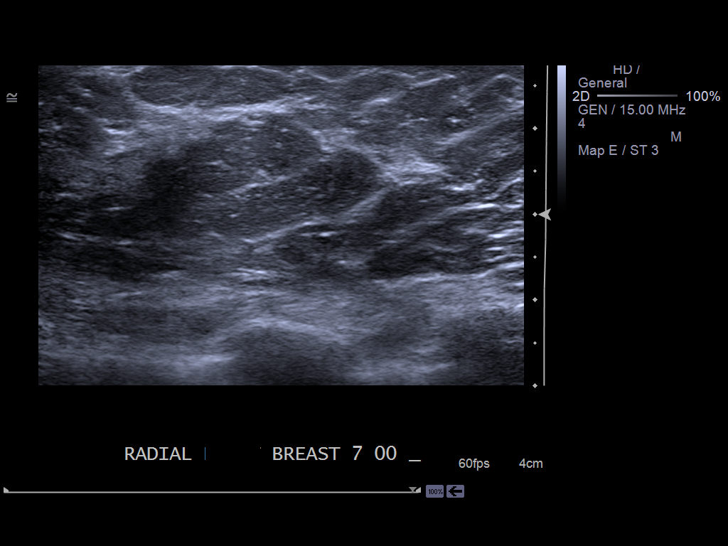
[im 3/30]
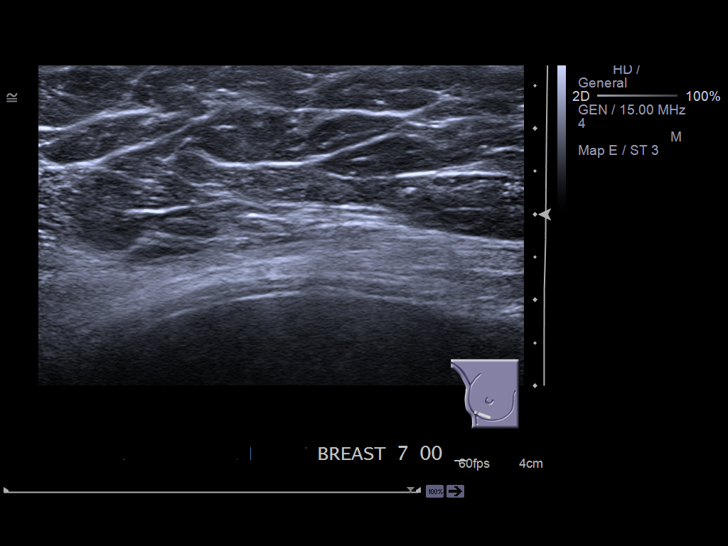
[im 5/30]
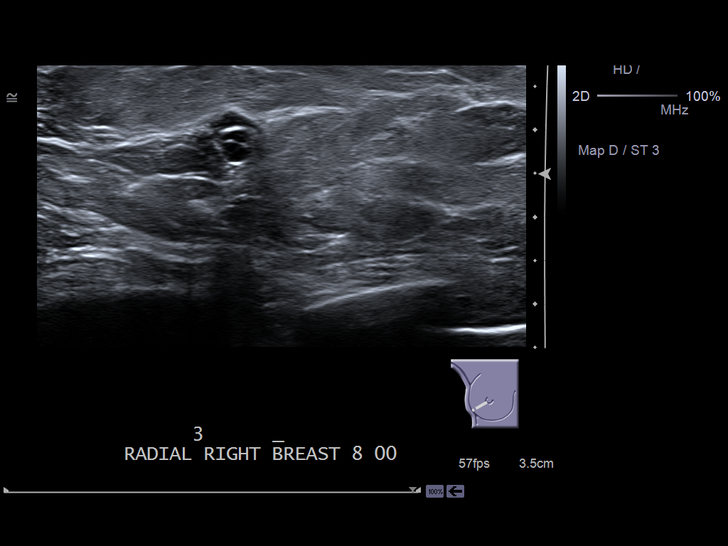
[im 8/30]
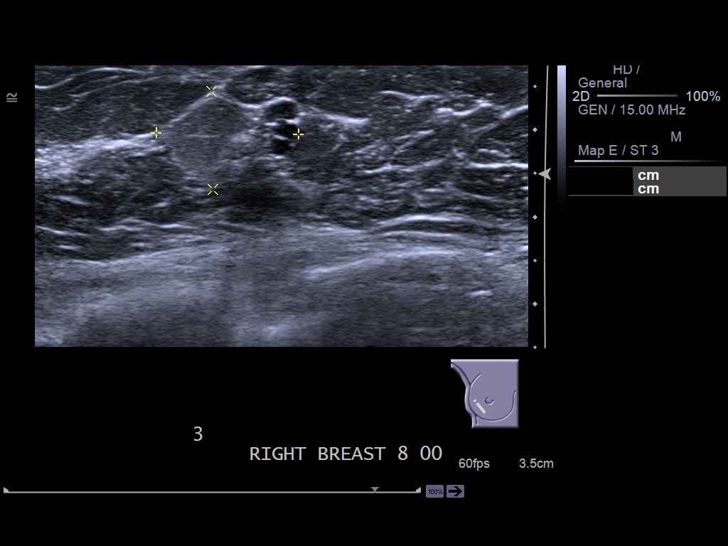
[im 10/30]
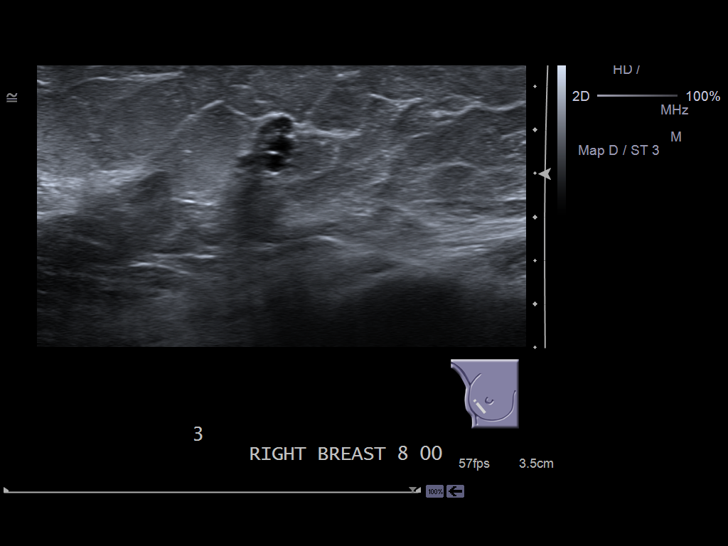
[im 11/30]
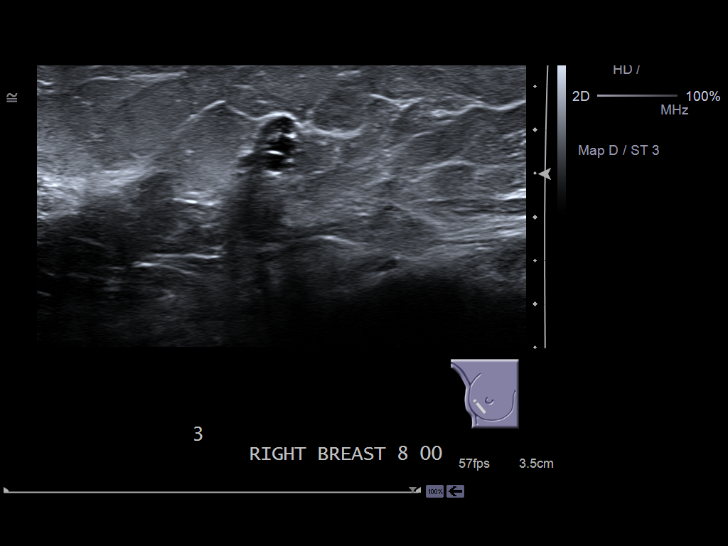
[im 14/30]
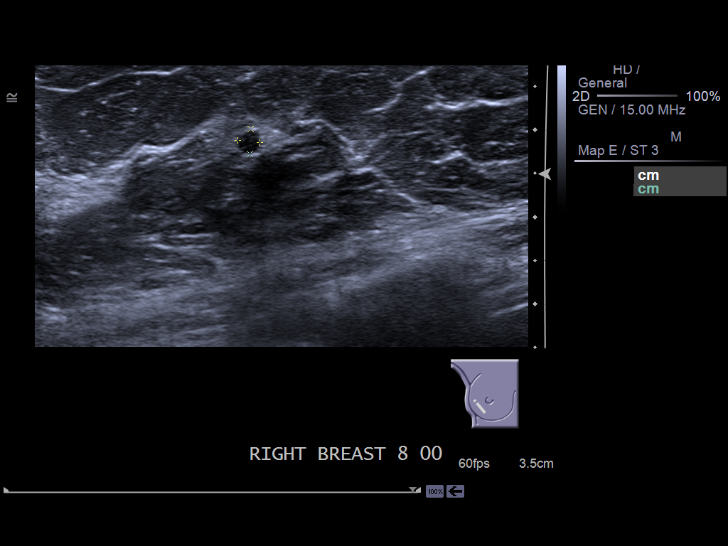
[im 16/30]
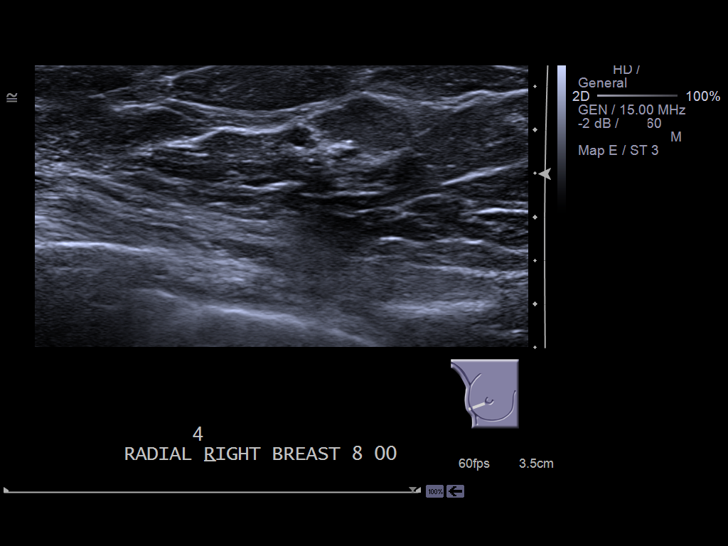
[im 19/30]
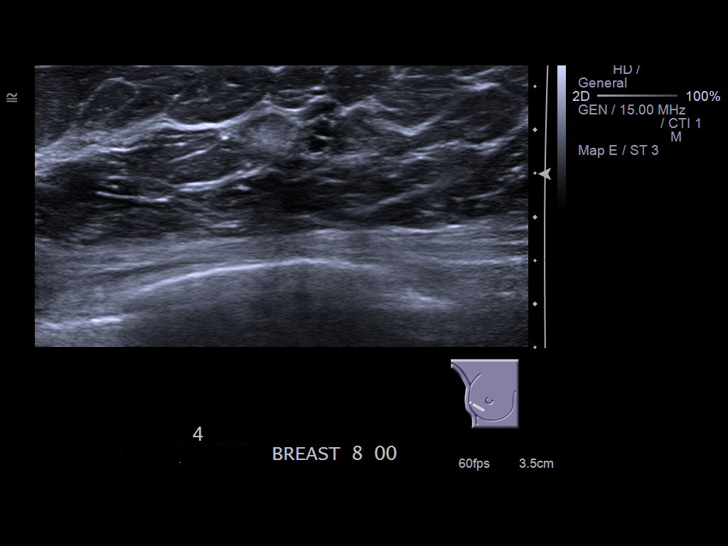
[im 20/30]
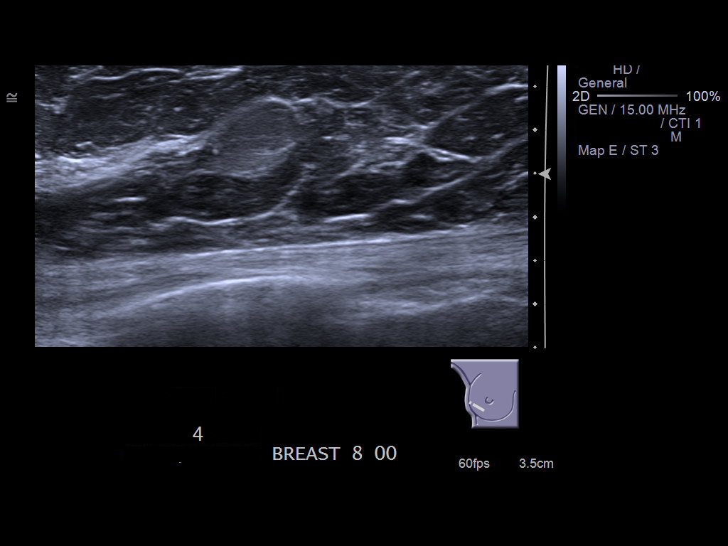
[im 22/30]
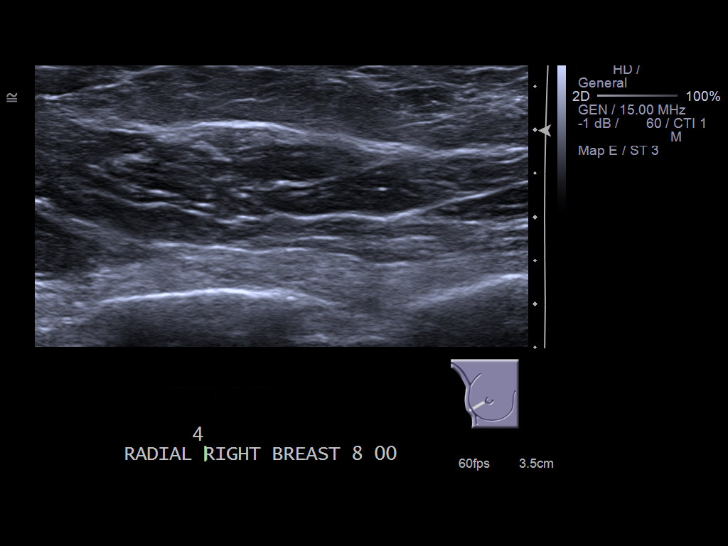
[im 25/30]
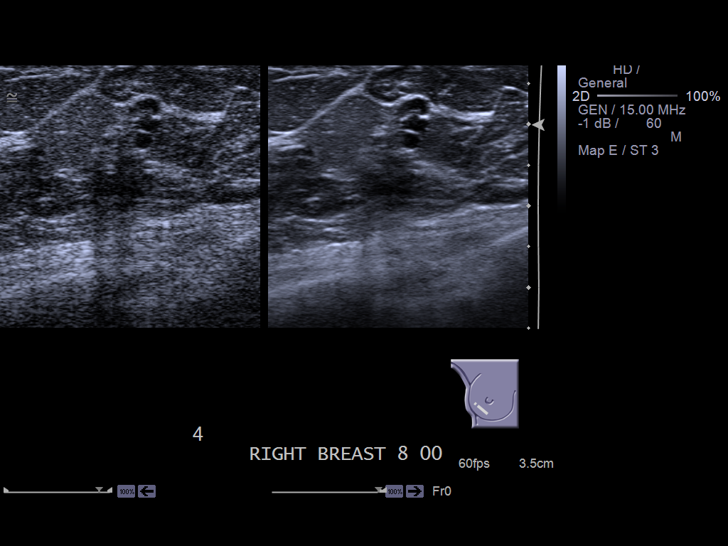
[im 27/30]
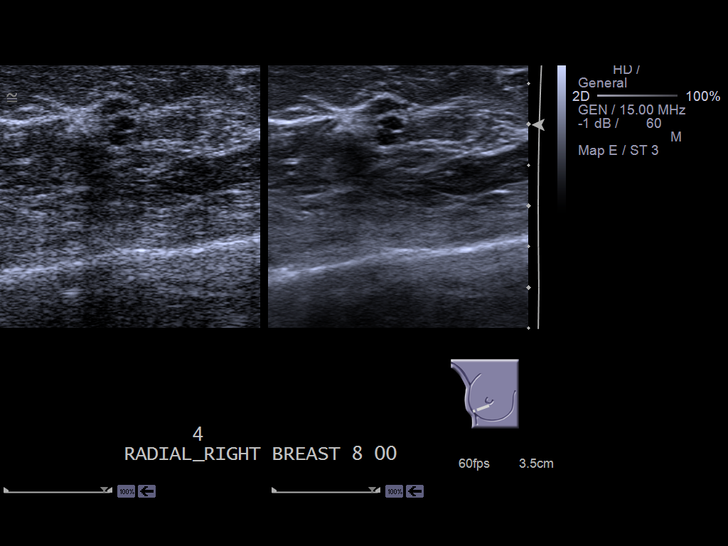
[im 30/30]
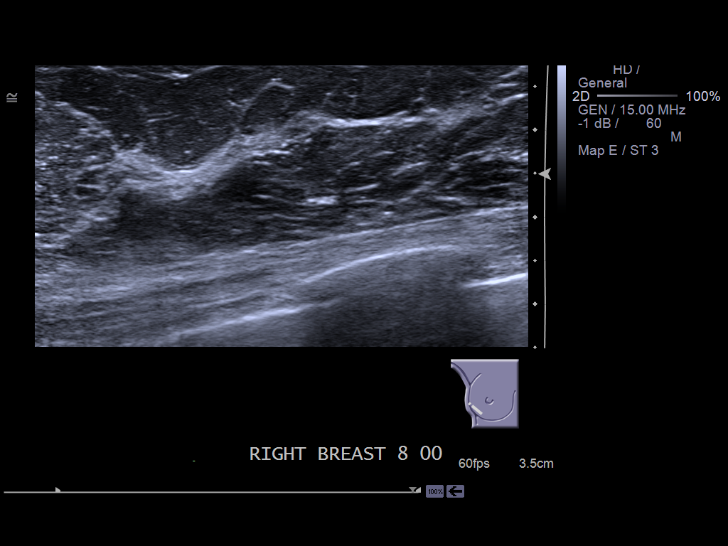

[14 of 25 positions shown; findings below may reference images not displayed]

PROCEDURE:     US  - US BREAST RIGHT  - July 21, 2012  [DATE]

RESULT:     Targeted ultrasound of the right breast 7 to [DATE] region shows
an area 3 to 4 cm from the nipple and [DATE] area with multiple cysts noted
adjacent to a minimally hyperechoic area measuring 1.4 x 1.13 cm. Cysts are
6.8 x 3.8 and 2.5 x 2.9 x 3.4 mm with a total overall size of this
conglomerate of solid and cystic material measuring 1.63 x 1.13 cm. This
does not have definite malignant features. This is likely benign given the
solid lesion is slightly hyperechoic and May have a fatty component. The
area mammographically is coming minimally more conspicuous compared to
multiple previous exams. Six-month followup images of the right breast only
is recommended.
IMPRESSION: Please see above. Likely benign conglomeration of solid and
cystic structures in the right breast [DATE] region 3 to 4 cm from the nipple.
Malignancy is not excluded and therefore six-month followup is recommended.

[REDACTED]

## 2014-03-16 IMAGING — MG MM ADDITIONAL VIEWS AT NO CHARGE
1 series · 4 of 4 positions shown · non-contrast
Comparison: none

REASON FOR EXAM: AV RT NODULAR DENSITY
COMMENTS:

[R ML · right · 4 of 4 slices shown]
[im 1/4]
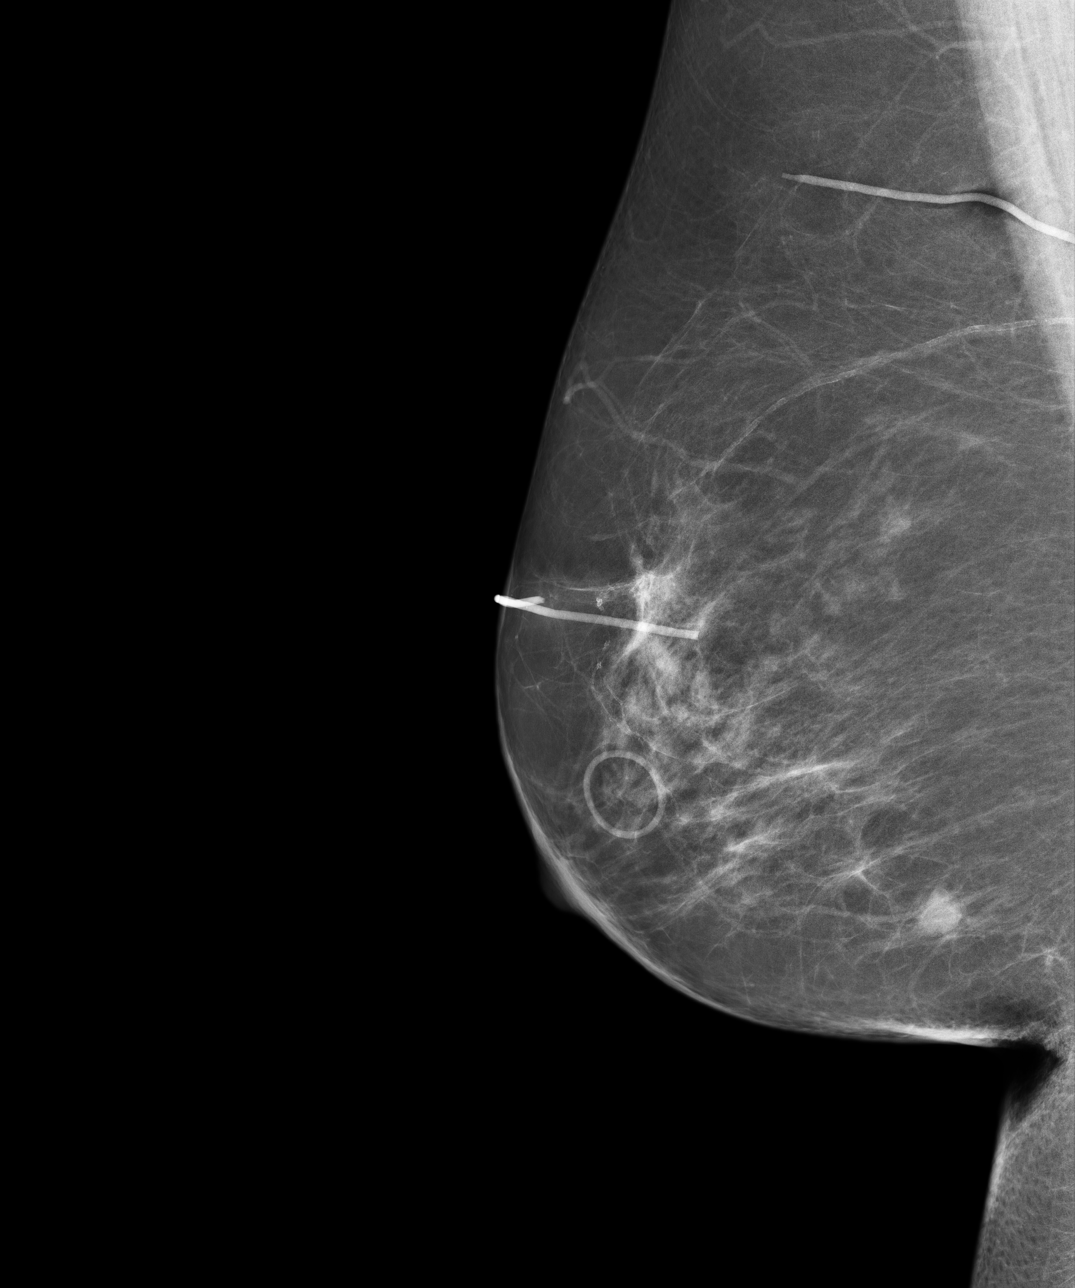
[im 2/4]
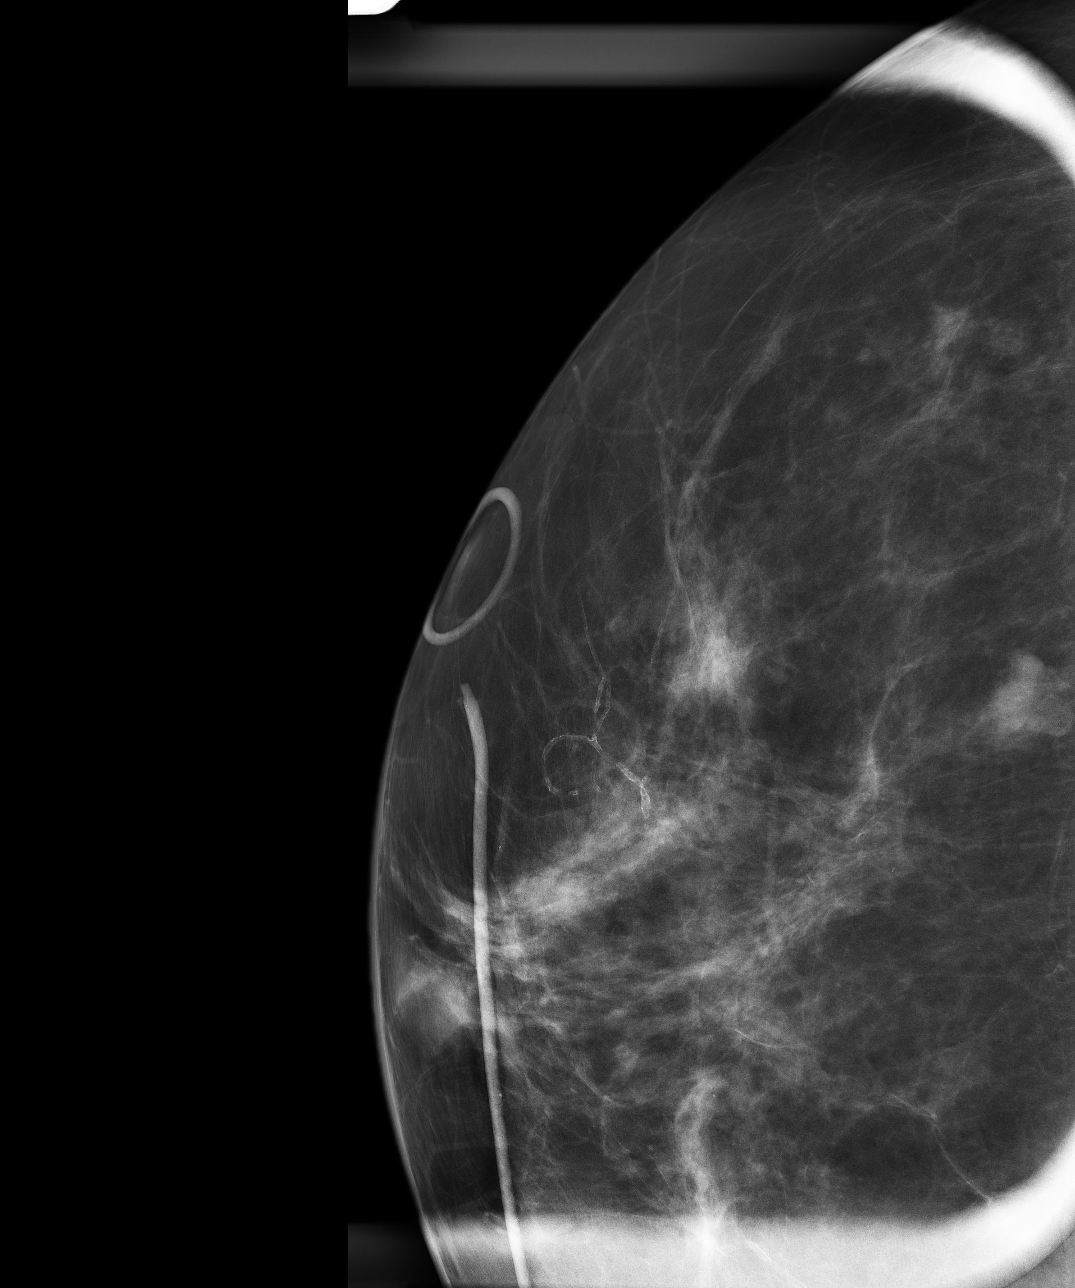
[im 3/4]
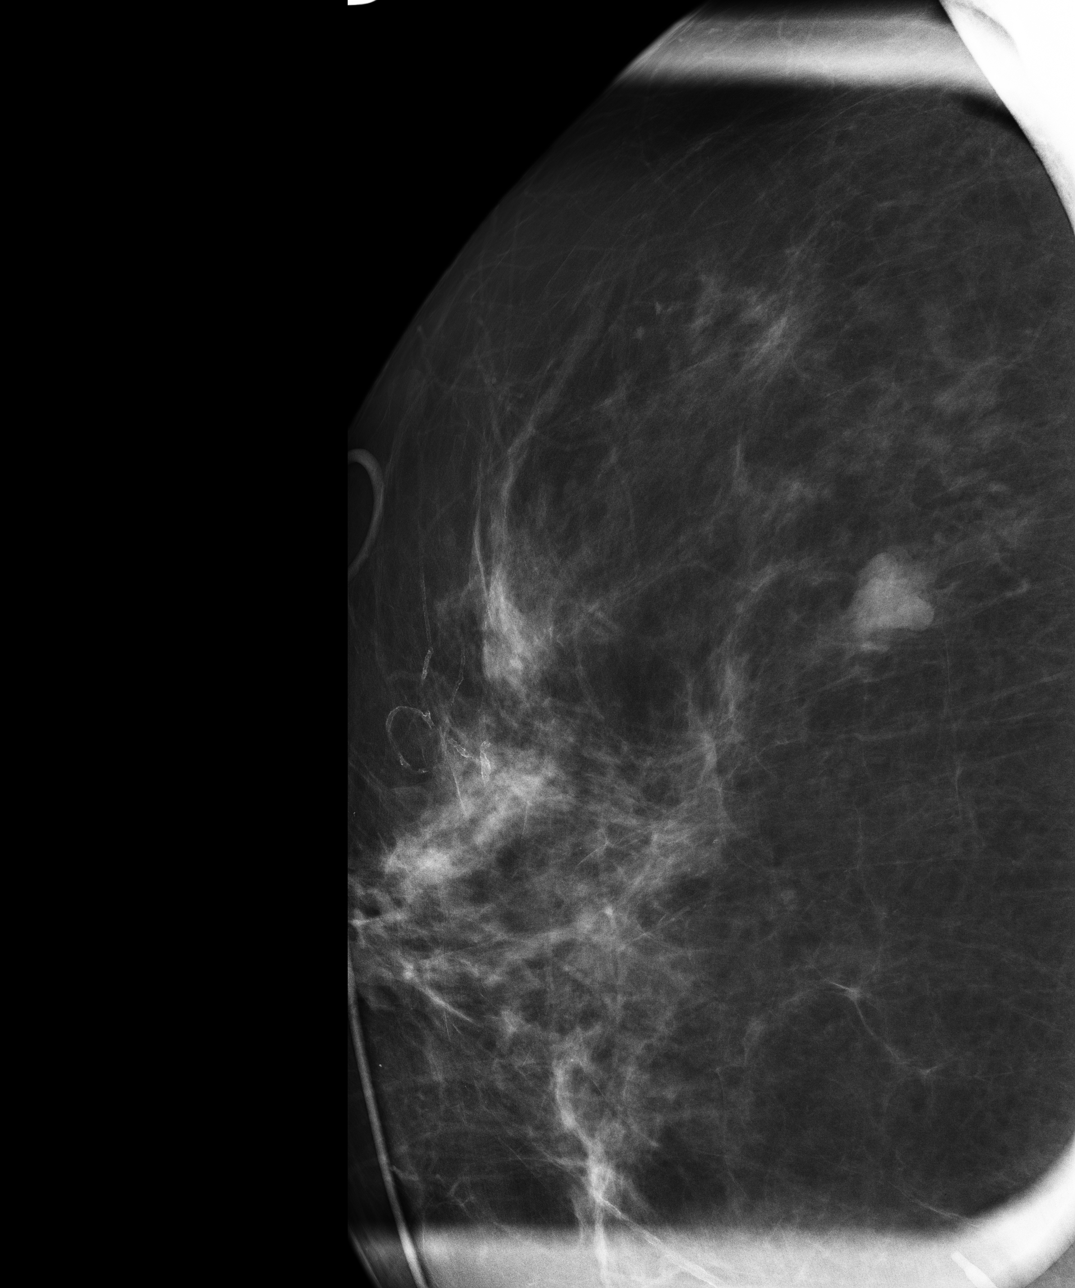
[im 4/4]
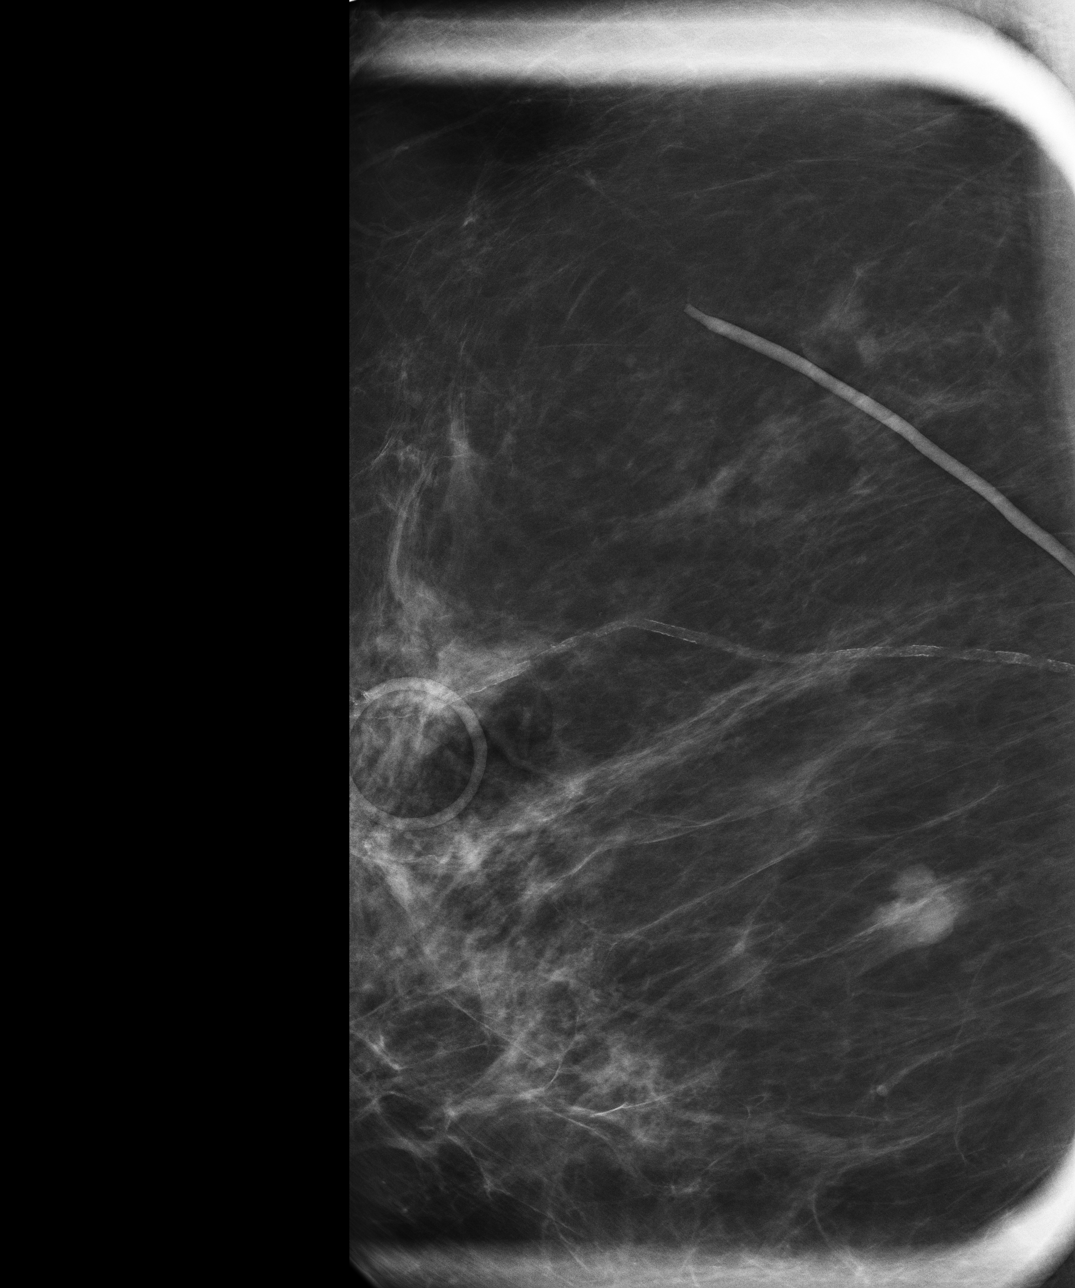

[4 of 4 positions shown; findings below may reference images not displayed]

PROCEDURE:     MAM - MAM DIG ADDVIEWS RT SCR  - July 21, 2012  [DATE]

RESULT:     The patient return for additional magnification compression
images of the right breast. The somewhat lobulated area of increased density
posteriorly projects below the level of the nipple and shows some irregular
margins with slight lobulation. This area is becoming slightly dense or more
dense compared to multiple previous studies but does not show significant
change in size. The area persists on the additional magnification
compression images without definite malignant calcifications. Some benign
appearing calcifications are seen elsewhere in the right breast.
IMPRESSION: Slightly more dense appearing stable size of lobulated
nodular density with irregular margins in the 7 to [DATE] region of the right
breast. Ultrasound is dictated separately does not show definite malignant
appearance. Six month followup of this mammographically and sonographically
is recommended. Malignancy is not completely excluded but the overall
appearance suggests a benign lesion. BI-RADS: Category 3 - Probably Benign
Finding - Initial Short Interval Follow - Up Suggested

A NEGATIVE MAMMOGRAM REPORT DOES NOT PRECLUDE BIOPSY OR OTHER EVALUATION OF
A CLINICALLY PALPABLE OR OTHERWISE SUSPICIOUS MASS OR LESION. BREAST CANCER
MAY NOT BE DETECTED IN UP T0 10% OF CASES.

[REDACTED]

## 2014-04-07 ENCOUNTER — Encounter: Payer: Self-pay | Admitting: Internal Medicine

## 2014-04-07 ENCOUNTER — Telehealth: Payer: Self-pay | Admitting: Internal Medicine

## 2014-04-07 ENCOUNTER — Ambulatory Visit: Payer: Self-pay | Admitting: Internal Medicine

## 2014-04-07 ENCOUNTER — Ambulatory Visit (INDEPENDENT_AMBULATORY_CARE_PROVIDER_SITE_OTHER): Payer: Medicare PPO | Admitting: Internal Medicine

## 2014-04-07 VITALS — BP 110/70 | HR 77 | Temp 98.2°F | Ht 64.0 in | Wt 195.5 lb

## 2014-04-07 DIAGNOSIS — E7800 Pure hypercholesterolemia, unspecified: Secondary | ICD-10-CM

## 2014-04-07 DIAGNOSIS — G459 Transient cerebral ischemic attack, unspecified: Secondary | ICD-10-CM

## 2014-04-07 DIAGNOSIS — E876 Hypokalemia: Secondary | ICD-10-CM

## 2014-04-07 DIAGNOSIS — C3490 Malignant neoplasm of unspecified part of unspecified bronchus or lung: Secondary | ICD-10-CM

## 2014-04-07 DIAGNOSIS — Z23 Encounter for immunization: Secondary | ICD-10-CM

## 2014-04-07 DIAGNOSIS — K219 Gastro-esophageal reflux disease without esophagitis: Secondary | ICD-10-CM

## 2014-04-07 DIAGNOSIS — C189 Malignant neoplasm of colon, unspecified: Secondary | ICD-10-CM

## 2014-04-07 DIAGNOSIS — I10 Essential (primary) hypertension: Secondary | ICD-10-CM

## 2014-04-07 DIAGNOSIS — F419 Anxiety disorder, unspecified: Secondary | ICD-10-CM

## 2014-04-07 DIAGNOSIS — N289 Disorder of kidney and ureter, unspecified: Secondary | ICD-10-CM

## 2014-04-07 LAB — BASIC METABOLIC PANEL
BUN: 21 mg/dL (ref 6–23)
CO2: 23 mEq/L (ref 19–32)
Calcium: 9.3 mg/dL (ref 8.4–10.5)
Chloride: 105 mEq/L (ref 96–112)
Creatinine, Ser: 1.3 mg/dL — ABNORMAL HIGH (ref 0.4–1.2)
GFR: 43.78 mL/min — AB (ref >60.00)
GLUCOSE: 90 mg/dL (ref 70–99)
POTASSIUM: 4.2 meq/L (ref 3.5–5.1)
SODIUM: 138 meq/L (ref 135–145)

## 2014-04-07 LAB — LIPID PANEL
Cholesterol: 210 mg/dL — ABNORMAL HIGH (ref 0–200)
HDL: 52.3 mg/dL (ref >39.00)
LDL CALC: 123 mg/dL — AB (ref 0–99)
NonHDL: 157.7
Total CHOL/HDL Ratio: 4
Triglycerides: 172 mg/dL — ABNORMAL HIGH (ref 0.0–149.0)
VLDL: 34.4 mg/dL (ref 0.0–40.0)

## 2014-04-07 LAB — HEPATIC FUNCTION PANEL
ALT: 14 U/L (ref 0–35)
AST: 19 U/L (ref 0–37)
Albumin: 3.2 g/dL — ABNORMAL LOW (ref 3.5–5.2)
Alkaline Phosphatase: 82 U/L (ref 39–117)
BILIRUBIN TOTAL: 0.6 mg/dL (ref 0.2–1.2)
Bilirubin, Direct: 0 mg/dL (ref 0.0–0.3)
Total Protein: 7.1 g/dL (ref 6.0–8.3)

## 2014-04-07 LAB — TSH: TSH: 1.71 u[IU]/mL (ref 0.35–4.50)

## 2014-04-07 NOTE — Progress Notes (Signed)
Pre visit review using our clinic review tool, if applicable. No additional management support is needed unless otherwise documented below in the visit note. 

## 2014-04-07 NOTE — Telephone Encounter (Signed)
Faxed request to [facility]

## 2014-04-07 NOTE — Telephone Encounter (Signed)
Placed in Dr. Scott's box °

## 2014-04-07 NOTE — Telephone Encounter (Signed)
Need last office note and last two mammograms and ultrasound from Dr Rolin Barry office.  Question of f/u with him.

## 2014-04-08 ENCOUNTER — Encounter: Payer: Self-pay | Admitting: Internal Medicine

## 2014-04-08 ENCOUNTER — Encounter: Payer: Self-pay | Admitting: *Deleted

## 2014-04-09 ENCOUNTER — Telehealth: Payer: Self-pay | Admitting: Internal Medicine

## 2014-04-09 NOTE — Telephone Encounter (Signed)
Pt notified & declined referral at this time. Will call back if she changes her mind.

## 2014-04-09 NOTE — Telephone Encounter (Signed)
Please notify pt that her back, hip and knee xrays reveal arthritis changes.  Given persistent pain, would like to refer to physical therapy for further evaluation and treatment.  If agreeable, let me know and I will place the order for the referral.

## 2014-04-12 ENCOUNTER — Encounter: Payer: Self-pay | Admitting: Internal Medicine

## 2014-04-12 NOTE — Progress Notes (Signed)
Subjective:    Patient ID: Victoria Hanson, female    DOB: 1950/08/15, 63 y.o.   MRN: [id number]  HPI 63 year old female with past history of colon cancer, lung cancer (s/p lobectomy), hypertension and hypercholesterolemia who comes in today for a scheduled follow up.  States she was evaluated in the ER on 12/13/12 for chest pain.  Was referred to Dr Rockey Situ.  States had stress test that was ok.  No further pain.  Breathing stable.  A previous visit had some concern regarding a possible TIA.  See that note for details.  Was started on aspirin.  Carotid ultrasound revealed no significant stenosis. Did reveal some changes possibly characteristic of fibromuscular dysplasia.  She was referred to Vascular surgery to determine if any further w/up warranted.  Had a CTA.   States was clear.  She also had MRI brain that revealed no acute abnormality.  Did have some chronic white matter changes.  Has not had reoccurring problems.  She does have issues with increased anxiety.  Doing better.  Rarely using the xanax now.  Taking trazodone to help her sleep.  Tolerating.  Eating and drinking well.   Blood pressure doing well.  She does report some low back pain.  Pain extends into her right hip and to her right knee.  Changed mattress.  Helped some.  Tylenol helps some.  Not taking regularly.  Overall she feels she is doing better.      Past Medical History  Diagnosis Date  . Colon cancer 1999    Pt had partial colectomy. Did develop lung metastasis. Had right upper lobectomy in 01/2007 then had associated chemotherapy.  . Hypercholesterolemia   . Hypertension   . Depression   . Nephrolithiasis   . Elevated transaminase level     ? NASH  . GERD (gastroesophageal reflux disease)   . Holter monitor, abnormal 12/2009    NSR, rare PACs and PVCs  . Echocardiogram abnormal 02/2010    EF 55-60%, mild LAE, no regional wall motion abnormalities  . Lung cancer     S/P resection (lobectomy - right)  . COPD (chronic  obstructive pulmonary disease)     Current Outpatient Prescriptions on File Prior to Visit  Medication Sig Dispense Refill  . ALPRAZolam (XANAX) 0.5 MG tablet Take 1 tablet (0.5 mg total) by mouth daily as needed for anxiety. 90 tablet 0  . amoxicillin-clavulanate (AUGMENTIN) 875-125 MG per tablet Take 1 tablet by mouth 2 (two) times daily. 14 tablet 0  . aspirin 81 MG tablet Take 81 mg by mouth daily.    . mometasone (NASONEX) 50 MCG/ACT nasal spray Place 2 sprays into the nose daily. 17 g 6  . Multiple Vitamin (MULTIVITAMIN) tablet Take 1 tablet by mouth daily.    Marland Kitchen omeprazole (PRILOSEC OTC) 20 MG tablet Take 1 tablet (20 mg total) by mouth daily. 30 tablet 5  . omeprazole (PRILOSEC) 20 MG capsule TAKE ONE (1) CAPSULE EACH DAY 30 capsule 2  . omeprazole (PRILOSEC) 20 MG capsule TAKE ONE (1) CAPSULE EACH DAY 30 capsule 5  . potassium chloride SA (K-DUR,KLOR-CON) 20 MEQ tablet TAKE ONE TABLET TWICE DAILY 60 tablet 5  . PROAIR HFA 108 (90 BASE) MCG/ACT inhaler INHALE TWO PUFFS INTO THE LUNGS EVERY 6 HOURS AS NEEDED FOR WHEEZING 8.5 g 3  . Probiotic Product ([place]) CAPS Take by mouth daily.    . traZODone (DESYREL) 50 MG tablet TAKE TWO AND ONE-HALF TABLETS AT BEDTIME 90  tablet 1  . triamterene-hydrochlorothiazide (MAXZIDE-25) 37.5-25 MG per tablet 1/2 tablet q day 30 tablet 2   No current facility-administered medications on file prior to visit.    Review of Systems Patient denies any headache.  Light headedness resolved.   No sinus or allergy symptoms.   No chest pain.  No chest tightness or palpitations.  No increased shortness of breath, cough or congestion.  No acid reflux.  No nausea or vomiting.  No abdominal pain or cramping.   No BRBPR or melana.  Just taking alprazolam now prn.  See above.  Taking trazodone. Overall feels better.   Still seeing Dr Oliva Bustard.  Back, hip and knee pain as outlined.       Objective:   Physical Exam  Filed Vitals:   04/07/14 0854  BP:  110/70  Pulse: 77  Temp: 98.2 F (36.8 C)   Blood pressure recheck:  3/27  63 year old female in no acute distress.   HEENT:  Nares- clear.  Oropharynx - without lesions. NECK:  Supple.  Nontender.  No audible bruit.  HEART:  Appears to be regular. LUNGS:  No crackles or wheezing audible.  Respirations even and unlabored.  RADIAL PULSE:  Equal bilaterally.   ABDOMEN:  Soft, nontender.  Bowel sounds present and normal.  No audible abdominal bruit.    EXTREMITIES:  No increased edema present.  DP pulses palpable and equal bilaterally.           Assessment & Plan:  PULMONARY.  Breathing stable.  Follow.    ABNORMAL MAMMOGRAM.  Saw Dr Pat Patrick.  Had mammogram and follow up mammogram and ultrasound.  She saw Dr Pat Patrick in follow up.   States everything checked out fine.  Felt no further w/up warranted currently.  Had mammogram 11/10/13 - Birads II.   Encounter for immunization Flu shot given today.   Essential hypertension Blood pressure doing well.  Same medication regimen.  - Basic metabolic panel  Transient cerebral ischemia, unspecified transient cerebral ischemia type On aspirin now.  No reoccurring problems.  W/u as outlined above and in last note.    Malignant neoplasm of lung, unspecified laterality, unspecified part of lung S/p lobectomy.  Sees Dr Oliva Bustard.  Breathing stable.    Colon cancer Colonoscopy 07/08/13 - internal hemorrhoids.  Recommended f/u colonoscopy in three years.   - Hepatic function panel Continue f/u with Dr Oliva Bustard.   Gastroesophageal reflux disease, esophagitis presence not specified Symptoms controlled on omeprazole.    Renal insufficiency Renal ultrasound 2012 - no significant abnormalities.  Cr 1.3.  Stay hydrated.  Follow.   Hypercholesterolemia Low cholesterol diet and exercise.  Off wellchol.  - Lipid panel  Anxiety, mild Doing better.  Has xanax if needed.  Rarely needs now.    Hypokalemia Recheck to confirm wnl.    HEALTH MAINTENANCE.   Physical 01/07/14.   Mammogram as outlined.  Last colonoscopy 07/08/13 revealed internal hemorrhoids.  Recommended f/u in three years.     I spent 25 minutes with the patient and more than 50% of the time was spent in consultation regarding thea above.

## 2014-04-13 ENCOUNTER — Other Ambulatory Visit: Payer: Self-pay | Admitting: Internal Medicine

## 2014-04-21 ENCOUNTER — Encounter: Payer: Self-pay | Admitting: Internal Medicine

## 2014-04-22 ENCOUNTER — Emergency Department: Payer: Self-pay | Admitting: Emergency Medicine

## 2014-04-22 ENCOUNTER — Ambulatory Visit: Payer: Medicare PPO | Admitting: Internal Medicine

## 2014-04-22 LAB — URINALYSIS, COMPLETE
BACTERIA: NONE SEEN
BILIRUBIN, UR: NEGATIVE
BLOOD: NEGATIVE
Glucose,UR: NEGATIVE mg/dL (ref 0–75)
Ketone: NEGATIVE
Nitrite: NEGATIVE
PH: 6 (ref 4.5–8.0)
PROTEIN: NEGATIVE
SPECIFIC GRAVITY: 1.006 (ref 1.003–1.030)
SQUAMOUS EPITHELIAL: NONE SEEN

## 2014-04-23 ENCOUNTER — Ambulatory Visit: Payer: Self-pay | Admitting: Oncology

## 2014-04-26 LAB — CBC CANCER CENTER
BASOS ABS: 0.1 x10 3/mm (ref 0.0–0.1)
Basophil %: 0.7 %
EOS PCT: 1.3 %
Eosinophil #: 0.1 x10 3/mm (ref 0.0–0.7)
HCT: 37.1 % (ref 35.0–47.0)
HGB: 12.2 g/dL (ref 12.0–16.0)
LYMPHS ABS: 1.9 x10 3/mm (ref 1.0–3.6)
Lymphocyte %: 19.5 %
MCH: 28.7 pg (ref 26.0–34.0)
MCHC: 32.8 g/dL (ref 32.0–36.0)
MCV: 87 fL (ref 80–100)
Monocyte #: 0.7 x10 3/mm (ref 0.2–0.9)
Monocyte %: 6.9 %
Neutrophil #: 7.1 x10 3/mm — ABNORMAL HIGH (ref 1.4–6.5)
Neutrophil %: 71.6 %
PLATELETS: 247 x10 3/mm (ref 150–440)
RBC: 4.24 10*6/uL (ref 3.80–5.20)
RDW: 13.2 % (ref 11.5–14.5)
WBC: 10 x10 3/mm (ref 3.6–11.0)

## 2014-04-26 LAB — COMPREHENSIVE METABOLIC PANEL
ALK PHOS: 110 U/L
Albumin: 3.5 g/dL (ref 3.4–5.0)
Anion Gap: 5 — ABNORMAL LOW (ref 7–16)
BILIRUBIN TOTAL: 0.3 mg/dL (ref 0.2–1.0)
BUN: 18 mg/dL (ref 7–18)
CREATININE: 1.21 mg/dL (ref 0.60–1.30)
Calcium, Total: 8.9 mg/dL (ref 8.5–10.1)
Chloride: 106 mmol/L (ref 98–107)
Co2: 31 mmol/L (ref 21–32)
GFR CALC AF AMER: 58 — AB
GFR CALC NON AF AMER: 48 — AB
Glucose: 94 mg/dL (ref 65–99)
OSMOLALITY: 285 (ref 275–301)
Potassium: 3.9 mmol/L (ref 3.5–5.1)
SGOT(AST): 24 U/L (ref 15–37)
SGPT (ALT): 50 U/L
Sodium: 142 mmol/L (ref 136–145)
TOTAL PROTEIN: 7.5 g/dL (ref 6.4–8.2)

## 2014-04-28 LAB — CEA: CEA: 0.6 ng/mL (ref 0.0–4.7)

## 2014-05-11 ENCOUNTER — Ambulatory Visit: Payer: Self-pay | Admitting: Oncology

## 2014-05-12 ENCOUNTER — Other Ambulatory Visit: Payer: Self-pay | Admitting: Internal Medicine

## 2014-05-12 ENCOUNTER — Other Ambulatory Visit: Payer: Self-pay | Admitting: *Deleted

## 2014-05-31 ENCOUNTER — Telehealth: Payer: Self-pay | Admitting: Internal Medicine

## 2014-05-31 NOTE — Telephone Encounter (Signed)
E R R O R  Patient decided to make an appt with the Nurse Practitioner in the middle of the call

## 2014-06-01 ENCOUNTER — Ambulatory Visit: Payer: Medicare PPO | Admitting: Nurse Practitioner

## 2014-06-03 ENCOUNTER — Ambulatory Visit (INDEPENDENT_AMBULATORY_CARE_PROVIDER_SITE_OTHER): Payer: Medicare PPO | Admitting: Internal Medicine

## 2014-06-03 ENCOUNTER — Ambulatory Visit: Payer: Medicare PPO | Admitting: Nurse Practitioner

## 2014-06-03 ENCOUNTER — Encounter: Payer: Self-pay | Admitting: Internal Medicine

## 2014-06-03 VITALS — BP 131/81 | HR 68 | Temp 97.8°F | Ht 64.0 in | Wt 197.5 lb

## 2014-06-03 DIAGNOSIS — J019 Acute sinusitis, unspecified: Secondary | ICD-10-CM

## 2014-06-03 DIAGNOSIS — N289 Disorder of kidney and ureter, unspecified: Secondary | ICD-10-CM

## 2014-06-03 DIAGNOSIS — I10 Essential (primary) hypertension: Secondary | ICD-10-CM

## 2014-06-03 MED ORDER — AMOXICILLIN-POT CLAVULANATE 875-125 MG PO TABS: 1.0000 | ORAL_TABLET | Freq: Two times a day (BID) | ORAL | Status: DC

## 2014-06-03 NOTE — Progress Notes (Signed)
Pre visit review using our clinic review tool, if applicable. No additional management support is needed unless otherwise documented below in the visit note. 

## 2014-06-03 NOTE — Patient Instructions (Signed)
Saline nasal spray - flush nose at least 2-3x/day  Nasacort nasal spray - 2 sprays each nostril one time per day.  Do this in the evening.   Robitussin if needed (twice a day if needed).

## 2014-06-10 ENCOUNTER — Other Ambulatory Visit: Payer: Self-pay | Admitting: Internal Medicine

## 2014-06-11 ENCOUNTER — Encounter: Payer: Self-pay | Admitting: Internal Medicine

## 2014-06-11 DIAGNOSIS — J329 Chronic sinusitis, unspecified: Secondary | ICD-10-CM | POA: Insufficient documentation

## 2014-06-11 DIAGNOSIS — J019 Acute sinusitis, unspecified: Secondary | ICD-10-CM | POA: Insufficient documentation

## 2014-06-11 NOTE — Progress Notes (Signed)
Subjective:    Patient ID: Victoria Hanson, female    DOB: 05/27/1951, 64 y.o.   MRN: [id number]  HPI 64 year old female with past history of colon cancer, lung cancer (s/p lobectomy), hypertension and hypercholesterolemia who comes in today with concerns regarding a possible sinus infection.  States symptoms started over the past week.  Describes sinus pressure.  Increased mucus production - colored and blood tinged.  Some increased drainage.  No sore throat.  No chest congestion.  No vomiting.  No diarrhea.  Using nasonex and taking tylenol.  Blood pressure doing well.     Past Medical History  Diagnosis Date  . Colon cancer 1999    Pt had partial colectomy. Did develop lung metastasis. Had right upper lobectomy in 01/2007 then had associated chemotherapy.  . Hypercholesterolemia   . Hypertension   . Depression   . Nephrolithiasis   . Elevated transaminase level     ? NASH  . GERD (gastroesophageal reflux disease)   . Holter monitor, abnormal 12/2009    NSR, rare PACs and PVCs  . Echocardiogram abnormal 02/2010    EF 55-60%, mild LAE, no regional wall motion abnormalities  . Lung cancer     S/P resection (lobectomy - right)  . COPD (chronic obstructive pulmonary disease)     Current Outpatient Prescriptions on File Prior to Visit  Medication Sig Dispense Refill  . ALPRAZolam (XANAX) 0.5 MG tablet Take 1 tablet (0.5 mg total) by mouth daily as needed for anxiety. 90 tablet 0  . aspirin 81 MG tablet Take 81 mg by mouth daily.    . mometasone (NASONEX) 50 MCG/ACT nasal spray Place 2 sprays into the nose daily. 17 g 6  . Multiple Vitamin (MULTIVITAMIN) tablet Take 1 tablet by mouth daily.    Marland Kitchen omeprazole (PRILOSEC) 20 MG capsule TAKE ONE (1) CAPSULE EACH DAY 30 capsule 6  . potassium chloride SA (K-DUR,KLOR-CON) 20 MEQ tablet TAKE ONE TABLET TWICE DAILY 60 tablet 5  . PROAIR HFA 108 (90 BASE) MCG/ACT inhaler INHALE TWO PUFFS INTO THE LUNGS EVERY 6 HOURS AS NEEDED FOR WHEEZING 8.5  g 3  . Probiotic Product ([place]) CAPS Take by mouth daily.    Marland Kitchen triamterene-hydrochlorothiazide (MAXZIDE-25) 37.5-25 MG per tablet TAKE ONE (1) TABLET EACH DAY 30 tablet 6   No current facility-administered medications on file prior to visit.    Review of Systems Patient denies any headache, lightheadedness or dizziness.  Increased sinus pressure.  Increased drainage.  No sore throat.  No chest pain, tightness or palpitations.  No increased shortness of breath, cough or congestion.  No nausea or vomiting.  No abdominal pain or cramping.  No bowel change, such as diarrhea.   No urine change.        Objective:   Physical Exam Filed Vitals:   06/03/14 1144  BP: 131/81  Pulse: 68  Temp: 97.8 F (33.58 C)   64 year old female in no acute distress.   HEENT:  Nares- slightly erythematous turbinates.   Oropharynx - without lesions.  Minimal tenderness to palpation over the sinuses. NECK:  Supple.  Nontender.   HEART:  Appears to be regular. LUNGS:  No crackles or wheezing audible.  Respirations even and unlabored.       Assessment & Plan:  1. Essential hypertension Blood pressure has been doing well.  Follow.  Check met b.   2. Renal insufficiency Stay hydrated.  Follow renal function.    3.  Acute sinusitis, recurrence not specified, unspecified location Symptoms and exam as outlined.  Appears to be c/w sinusitis.  Feels similar to her previous sinus infections.  Treat with augmentin as directed.  nasonex and saline nasal spray as directed.  Robitussin as needed.  Follow.  Notify me if symptoms worsen or do not resolve.

## 2014-07-01 ENCOUNTER — Ambulatory Visit: Payer: Medicare PPO | Admitting: Nurse Practitioner

## 2014-07-09 ENCOUNTER — Other Ambulatory Visit: Payer: Self-pay | Admitting: Internal Medicine

## 2014-07-14 ENCOUNTER — Encounter: Payer: Self-pay | Admitting: Internal Medicine

## 2014-07-14 ENCOUNTER — Ambulatory Visit (INDEPENDENT_AMBULATORY_CARE_PROVIDER_SITE_OTHER): Payer: Medicare PPO | Admitting: Internal Medicine

## 2014-07-14 VITALS — BP 120/70 | HR 72 | Temp 98.1°F | Ht 64.0 in | Wt 198.0 lb

## 2014-07-14 DIAGNOSIS — C3490 Malignant neoplasm of unspecified part of unspecified bronchus or lung: Secondary | ICD-10-CM

## 2014-07-14 DIAGNOSIS — E7800 Pure hypercholesterolemia, unspecified: Secondary | ICD-10-CM

## 2014-07-14 DIAGNOSIS — Z0000 Encounter for general adult medical examination without abnormal findings: Secondary | ICD-10-CM

## 2014-07-14 DIAGNOSIS — K219 Gastro-esophageal reflux disease without esophagitis: Secondary | ICD-10-CM

## 2014-07-14 DIAGNOSIS — N289 Disorder of kidney and ureter, unspecified: Secondary | ICD-10-CM

## 2014-07-14 DIAGNOSIS — C189 Malignant neoplasm of colon, unspecified: Secondary | ICD-10-CM

## 2014-07-14 DIAGNOSIS — F419 Anxiety disorder, unspecified: Secondary | ICD-10-CM

## 2014-07-14 DIAGNOSIS — I10 Essential (primary) hypertension: Secondary | ICD-10-CM

## 2014-07-14 LAB — HEPATIC FUNCTION PANEL
ALT: 13 U/L (ref 0–35)
AST: 14 U/L (ref 0–37)
Albumin: 3.8 g/dL (ref 3.5–5.2)
Alkaline Phosphatase: 100 U/L (ref 39–117)
Bilirubin, Direct: 0.1 mg/dL (ref 0.0–0.3)
Total Bilirubin: 0.4 mg/dL (ref 0.2–1.2)
Total Protein: 6.9 g/dL (ref 6.0–8.3)

## 2014-07-14 LAB — BASIC METABOLIC PANEL
BUN: 23 mg/dL (ref 6–23)
CALCIUM: 9.5 mg/dL (ref 8.4–10.5)
CO2: 28 mEq/L (ref 19–32)
Chloride: 105 mEq/L (ref 96–112)
Creatinine, Ser: 1.28 mg/dL — ABNORMAL HIGH (ref 0.40–1.20)
GFR: 44.53 mL/min — AB (ref >60.00)
Glucose, Bld: 96 mg/dL (ref 70–99)
POTASSIUM: 4.1 meq/L (ref 3.5–5.1)
Sodium: 138 mEq/L (ref 135–145)

## 2014-07-14 LAB — LIPID PANEL
Cholesterol: 218 mg/dL — ABNORMAL HIGH (ref 0–200)
HDL: 57.7 mg/dL (ref >39.00)
LDL Cholesterol: 138 mg/dL — ABNORMAL HIGH (ref 0–99)
NonHDL: 160.3
Total CHOL/HDL Ratio: 4
Triglycerides: 113 mg/dL (ref 0.0–149.0)
VLDL: 22.6 mg/dL (ref 0.0–40.0)

## 2014-07-14 MED ORDER — ALPRAZOLAM 0.5 MG PO TABS: 0.5000 mg | ORAL_TABLET | Freq: Every day | ORAL | Status: DC | PRN

## 2014-07-14 NOTE — Progress Notes (Signed)
Pre visit review using our clinic review tool, if applicable. No additional management support is needed unless otherwise documented below in the visit note. 

## 2014-07-14 NOTE — Progress Notes (Signed)
Patient ID: Victoria Hanson, female   DOB: Sep 01, 1950, 64 y.o.   MRN: [id number]   Subjective:    Patient ID: Victoria Hanson, female    DOB: 08/11/1950, 64 y.o.   MRN: [id number]  HPI  Patient here for for a scheduled follow up.  Has a history of hypertension and hypercholesterolemia.  Previous hip pain.  Resolved.  Now with toe pain.  Has a "corn" on her toe.  Also reports some swelling.  Notices intermittently in her feet and ankles.  Resolves in am.  Worse as day progresses.  No pain in her ankles.  Breathing stable.  No cough or congestion.  Handling stress well.  Rarely takes xanax.     Past Medical History  Diagnosis Date  . Colon cancer 1999    Pt had partial colectomy. Did develop lung metastasis. Had right upper lobectomy in 01/2007 then had associated chemotherapy.  . Hypercholesterolemia   . Hypertension   . Depression   . Nephrolithiasis   . Elevated transaminase level     ? NASH  . GERD (gastroesophageal reflux disease)   . Holter monitor, abnormal 12/2009    NSR, rare PACs and PVCs  . Echocardiogram abnormal 02/2010    EF 55-60%, mild LAE, no regional wall motion abnormalities  . Lung cancer     S/P resection (lobectomy - right)  . COPD (chronic obstructive pulmonary disease)     Current Outpatient Prescriptions on File Prior to Visit  Medication Sig Dispense Refill  . aspirin 81 MG tablet Take 81 mg by mouth daily.    . mometasone (NASONEX) 50 MCG/ACT nasal spray Place 2 sprays into the nose daily. 17 g 6  . Multiple Vitamin (MULTIVITAMIN) tablet Take 1 tablet by mouth daily.    Marland Kitchen omeprazole (PRILOSEC) 20 MG capsule TAKE ONE (1) CAPSULE EACH DAY 30 capsule 6  . potassium chloride SA (K-DUR,KLOR-CON) 20 MEQ tablet TAKE ONE TABLET TWICE DAILY 60 tablet 3  . PROAIR HFA 108 (90 BASE) MCG/ACT inhaler INHALE TWO PUFFS INTO THE LUNGS EVERY 6 HOURS AS NEEDED FOR WHEEZING 8.5 g 3  . traZODone (DESYREL) 50 MG tablet TAKE TWO AND ONE-HALF TABLETS AT BEDTIME 90 tablet 2  .  triamterene-hydrochlorothiazide (MAXZIDE-25) 37.5-25 MG per tablet TAKE ONE (1) TABLET EACH DAY 30 tablet 6   No current facility-administered medications on file prior to visit.    Review of Systems  Constitutional: Negative for appetite change, fatigue and unexpected weight change.  HENT: Negative for congestion and sinus pressure.   Respiratory: Negative for cough, chest tightness and shortness of breath.   Cardiovascular: Positive for leg swelling (minimal ankle swelling.  ). Negative for chest pain and palpitations.  Gastrointestinal: Negative for nausea, abdominal pain, diarrhea and constipation.  Musculoskeletal: Negative for back pain and joint swelling (does report toe pain. "corn" on her toe.  ).  Skin: Negative for color change and rash.  Neurological: Negative for dizziness, light-headedness and headaches.       Objective:    Physical Exam  HENT:  Nose: Nose normal.  Mouth/Throat: Oropharynx is clear and moist.  Neck: Neck supple. No thyromegaly present.  Cardiovascular: Normal rate and regular rhythm.   Pulmonary/Chest: Breath sounds normal. No respiratory distress. She has no wheezes.  Abdominal: Soft. Bowel sounds are normal. There is no tenderness.  Musculoskeletal: She exhibits no edema or tenderness.  Lymphadenopathy:    She has no cervical adenopathy.  Skin: No rash noted. No erythema.    BP  120/70 mmHg  Pulse 72  Temp(Src) 98.1 F (36.7 C) (Oral)  Ht 5\' 4"  (1.626 m)  Wt 198 lb (89.812 kg)  BMI 33.97 kg/m2  SpO2 97% Wt Readings from Last 3 Encounters:  07/14/14 198 lb (89.812 kg)  06/03/14 197 lb 8 oz (89.585 kg)  04/07/14 195 lb 8 oz (88.678 kg)     Lab Results  Component Value Date   WBC 9.9 02/05/2013   HGB 12.7 02/05/2013   HCT 38.0 02/05/2013   PLT 250.0 02/05/2013   GLUCOSE 96 07/14/2014   CHOL 218* 07/14/2014   TRIG 113.0 07/14/2014   HDL 57.70 07/14/2014   LDLDIRECT 163.4 12/31/2012   LDLCALC 138* 07/14/2014   ALT 13 07/14/2014    AST 14 07/14/2014   NA 138 07/14/2014   K 4.1 07/14/2014   CL 105 07/14/2014   CREATININE 1.28* 07/14/2014   BUN 23 07/14/2014   CO2 28 07/14/2014   TSH 1.71 04/07/2014       Assessment & Plan:   Problem List Items Addressed This Visit    Anxiety, mild    Doing better.  Rarely uses xanax now.        Relevant Medications   ALPRAZolam Duanne Moron) tablet   Colon cancer    Colonoscopy 07/08/13 - internal hemorrhoids.  Recommended f/u colonoscopy in 3 years.        Relevant Medications   ALPRAZolam Duanne Moron) tablet   Other Relevant Orders   Hepatic function panel (Completed)   GERD (gastroesophageal reflux disease)    Symptoms controlled on omeprazole.        Health care maintenance    Physical 01/07/14.        Hypercholesterolemia    Low cholesterol diet and exercise.  Unable to take statin.  Stopped cholestyramine.  Diet and exercise.  Will follow.        Relevant Orders   Lipid panel (Completed)   Hypertension - Primary    Blood pressure doing well.  Same medication regimen.  Follow.  Check metabolic panel.       Relevant Orders   Basic metabolic panel (Completed)   Lung cancer    S/p lobectomy.  Sees Dr Oliva Bustard.  Breathing stable.  Continue to f/u with Dr Oliva Bustard.        Relevant Medications   ALPRAZolam Duanne Moron) tablet   Renal insufficiency    Renal US 03/2011 - no significant abnormalities.  Renal function has been stable.  Check metabolic panel.            Einar Pheasant, MD

## 2014-07-15 ENCOUNTER — Encounter: Payer: Self-pay | Admitting: *Deleted

## 2014-07-17 ENCOUNTER — Encounter: Payer: Self-pay | Admitting: Internal Medicine

## 2014-07-17 DIAGNOSIS — Z0000 Encounter for general adult medical examination without abnormal findings: Secondary | ICD-10-CM | POA: Insufficient documentation

## 2014-07-17 NOTE — Assessment & Plan Note (Signed)
Blood pressure doing well.  Same medication regimen.  Follow.  Check metabolic panel.

## 2014-07-17 NOTE — Assessment & Plan Note (Signed)
Symptoms controlled on omeprazole.

## 2014-07-17 NOTE — Assessment & Plan Note (Signed)
Renal US 03/2011 - no significant abnormalities.  Renal function has been stable.  Check metabolic panel.

## 2014-07-17 NOTE — Assessment & Plan Note (Signed)
Doing better.  Rarely uses xanax now.

## 2014-07-17 NOTE — Assessment & Plan Note (Signed)
Colonoscopy 07/08/13 - internal hemorrhoids.  Recommended f/u colonoscopy in 3 years.

## 2014-07-17 NOTE — Assessment & Plan Note (Signed)
S/p lobectomy.  Sees Dr Oliva Bustard.  Breathing stable.  Continue to f/u with Dr Oliva Bustard.

## 2014-07-17 NOTE — Assessment & Plan Note (Signed)
Low cholesterol diet and exercise.  Unable to take statin.  Stopped cholestyramine.  Diet and exercise.  Will follow.

## 2014-07-17 NOTE — Assessment & Plan Note (Signed)
Physical 01/07/14.

## 2014-08-01 ENCOUNTER — Emergency Department: Payer: Self-pay | Admitting: Physician Assistant

## 2014-08-02 ENCOUNTER — Emergency Department: Payer: Self-pay | Admitting: Emergency Medicine

## 2014-08-05 ENCOUNTER — Emergency Department: Payer: Self-pay | Admitting: Internal Medicine

## 2014-08-07 IMAGING — CR DG CHEST 2V
1 series · 2 of 2 positions shown · non-contrast
Comparison: none

REASON FOR EXAM: Chest Pain
COMMENTS:

PROCEDURE:     DXR - DXR CHEST PA (OR AP) AND LATERAL  - December 12, 2012  [DATE]
RESULT:     Comparison: 12/13/2009

[Series 1: w chest pa · 0.14mm/px · 2 of 2 slices shown]
[im 1/2]
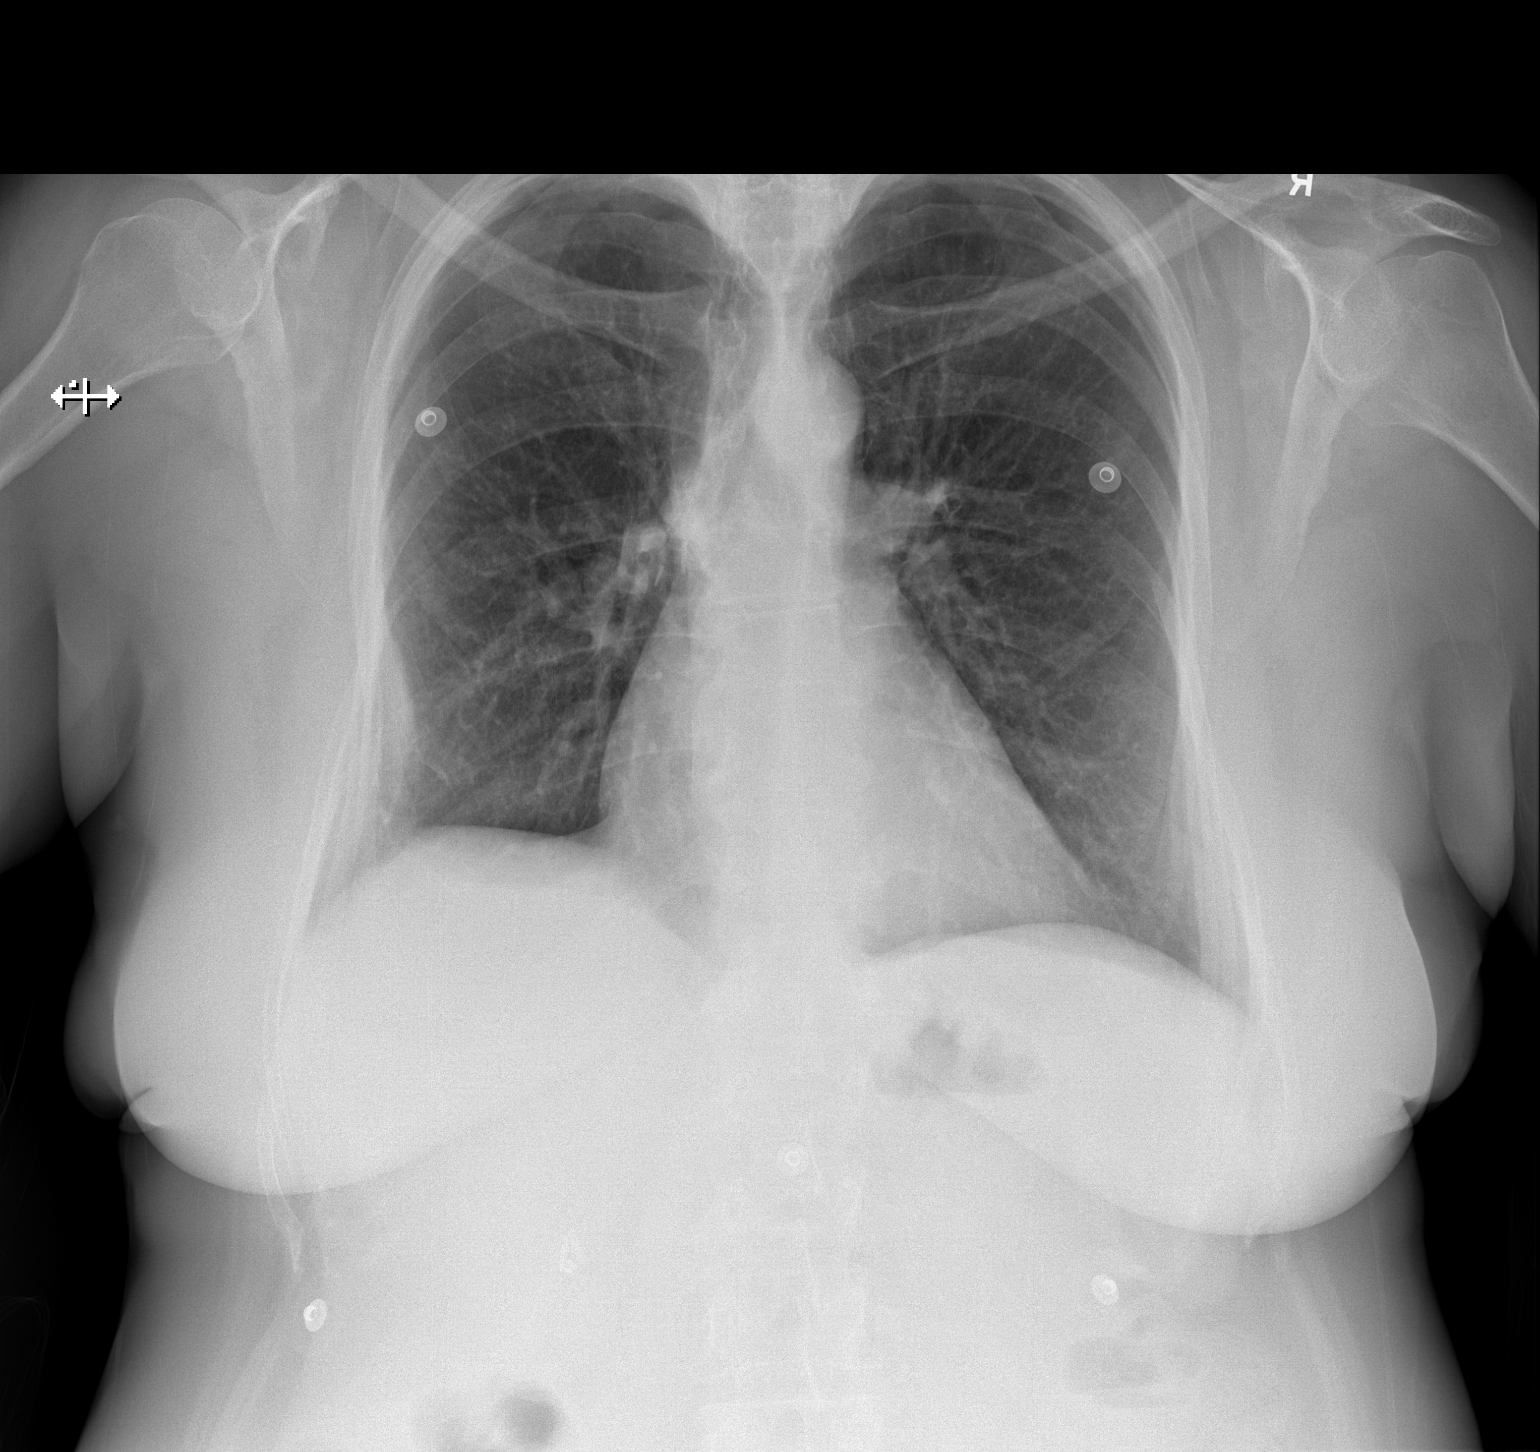
[im 2/2]
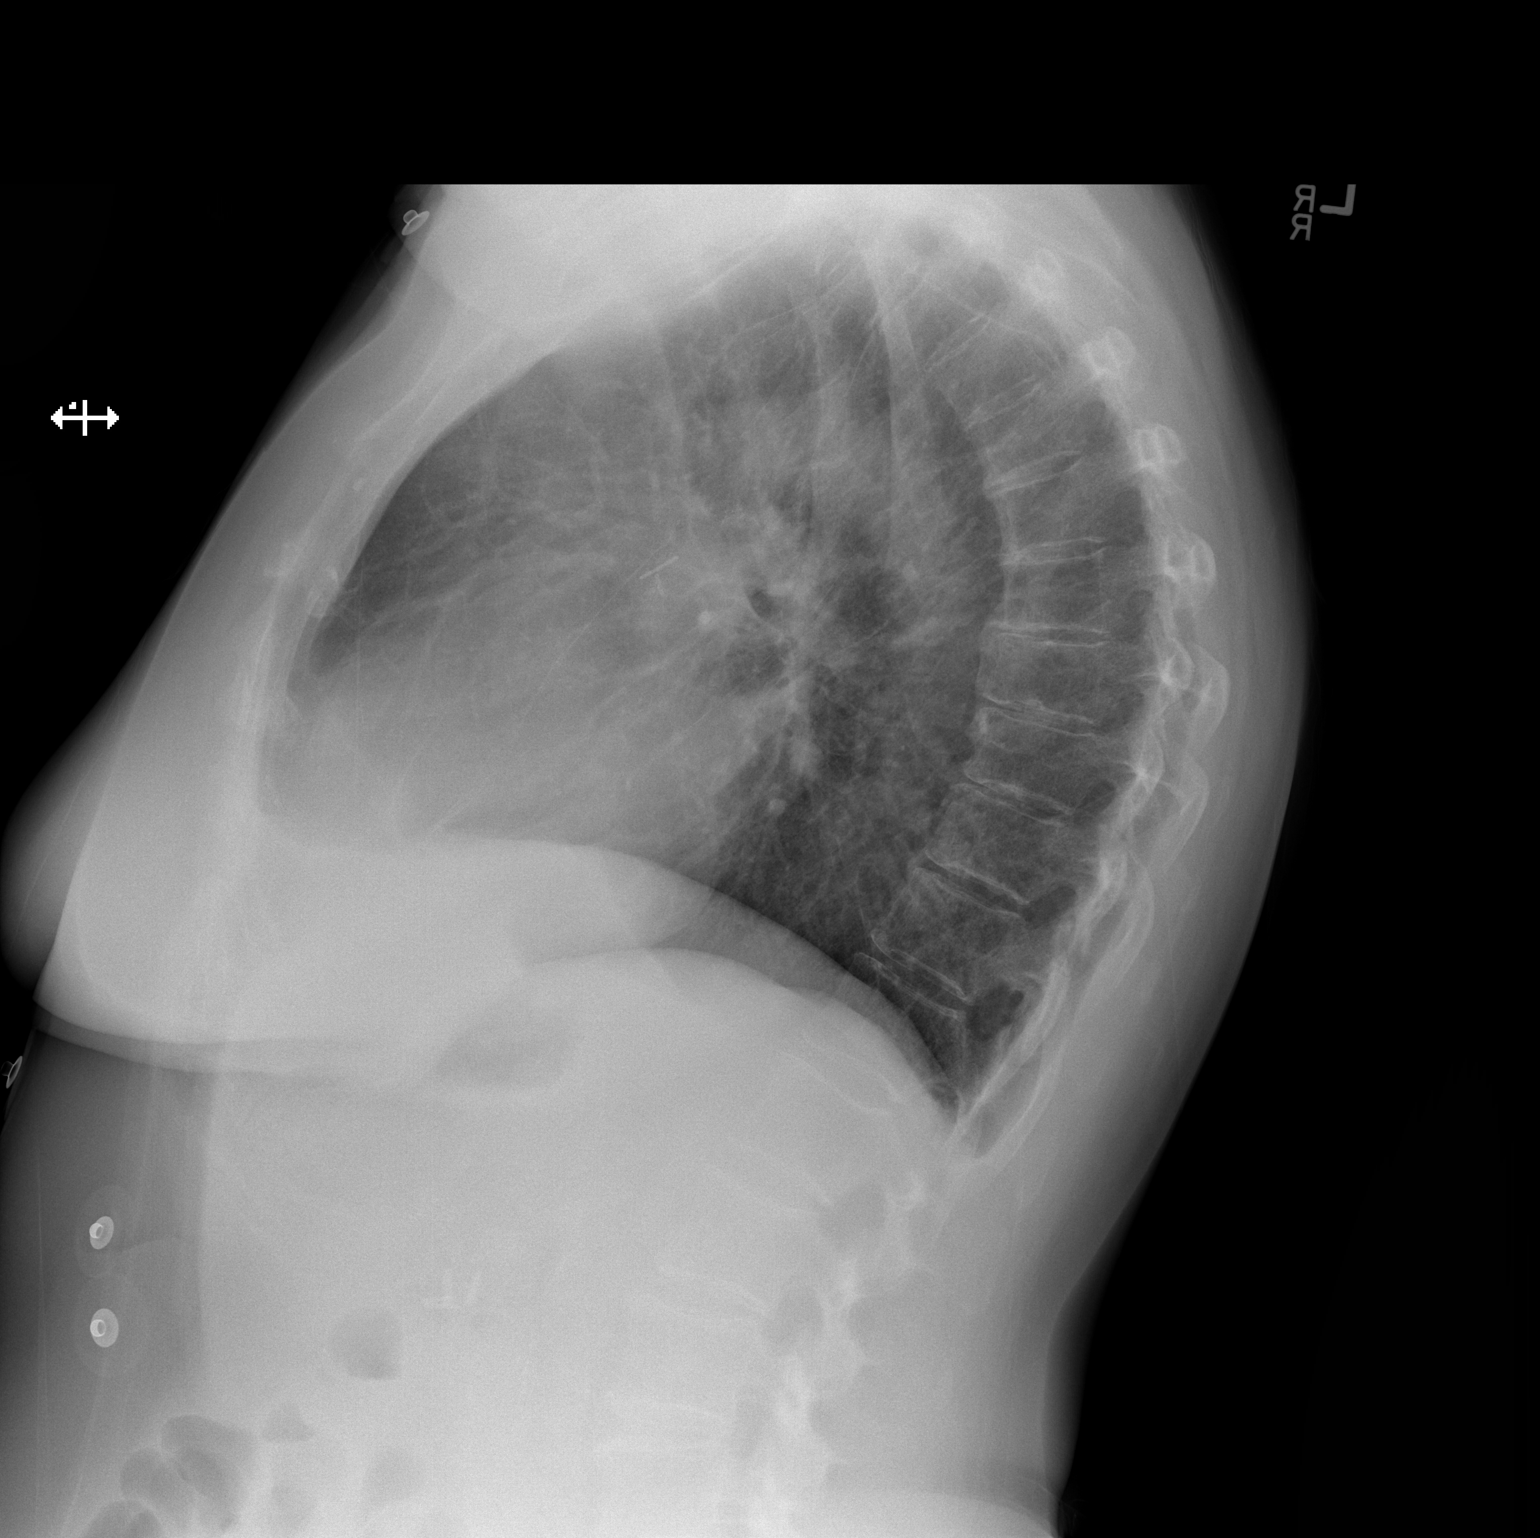

[2 of 2 positions shown; findings below may reference images not displayed]

FINDINGS: The heart and mediastinum are stable. No focal pulmonary opacities. Mild
thickening along the right peripheral lower hemithorax is similar to prior.
IMPRESSION: No acute cardiopulmonary disease.

[REDACTED]

## 2014-08-09 ENCOUNTER — Emergency Department: Payer: Self-pay | Admitting: Emergency Medicine

## 2014-08-09 ENCOUNTER — Ambulatory Visit: Payer: Medicare PPO | Admitting: Internal Medicine

## 2014-09-01 ENCOUNTER — Ambulatory Visit
Admit: 2014-09-01 | Disposition: A | Discharge status: Home / Self Care | Payer: Self-pay | Attending: Oncology | Admitting: Oncology

## 2014-09-03 LAB — COMPREHENSIVE METABOLIC PANEL
ALBUMIN: 3.7 g/dL
ALK PHOS: 85 U/L
ALT: 9 U/L — AB
AST: 16 U/L
Anion Gap: 4 — ABNORMAL LOW (ref 7–16)
BUN: 23 mg/dL — AB
Bilirubin,Total: 0.4 mg/dL
CHLORIDE: 103 mmol/L
Calcium, Total: 8.8 mg/dL — ABNORMAL LOW
Co2: 27 mmol/L
Creatinine: 1.28 mg/dL — ABNORMAL HIGH
EGFR (African American): 51 — ABNORMAL LOW
GFR CALC NON AF AMER: 44 — AB
Glucose: 101 mg/dL — ABNORMAL HIGH
Potassium: 4.1 mmol/L
Sodium: 134 mmol/L — ABNORMAL LOW
TOTAL PROTEIN: 7.2 g/dL

## 2014-09-03 LAB — CBC CANCER CENTER
BASOS PCT: 1 %
Basophil #: 0.1 x10 3/mm (ref 0.0–0.1)
EOS ABS: 0.1 x10 3/mm (ref 0.0–0.7)
Eosinophil %: 1.3 %
HCT: 36.4 % (ref 35.0–47.0)
HGB: 12.1 g/dL (ref 12.0–16.0)
LYMPHS PCT: 27.8 %
Lymphocyte #: 2.2 x10 3/mm (ref 1.0–3.6)
MCH: 27.8 pg (ref 26.0–34.0)
MCHC: 33.2 g/dL (ref 32.0–36.0)
MCV: 84 fL (ref 80–100)
MONOS PCT: 6.4 %
Monocyte #: 0.5 x10 3/mm (ref 0.2–0.9)
NEUTROS PCT: 63.5 %
Neutrophil #: 5.1 x10 3/mm (ref 1.4–6.5)
Platelet: 232 x10 3/mm (ref 150–440)
RBC: 4.35 10*6/uL (ref 3.80–5.20)
RDW: 13.2 % (ref 11.5–14.5)
WBC: 8 x10 3/mm (ref 3.6–11.0)

## 2014-09-10 ENCOUNTER — Ambulatory Visit
Admit: 2014-09-10 | Disposition: A | Discharge status: Home / Self Care | Payer: Self-pay | Attending: Oncology | Admitting: Oncology

## 2014-09-14 IMAGING — US ULTRASOUND RIGHT BREAST
1 series · 14 of 22 positions shown · non-contrast
Comparison: none

REASON FOR EXAM: RT BRST NODULAR DENSITY FU
COMMENTS:

[Series 1: ultrasound right breast · 0.07mm/px · 22 acquisitions, 14 frames shown]
[im 1/22]
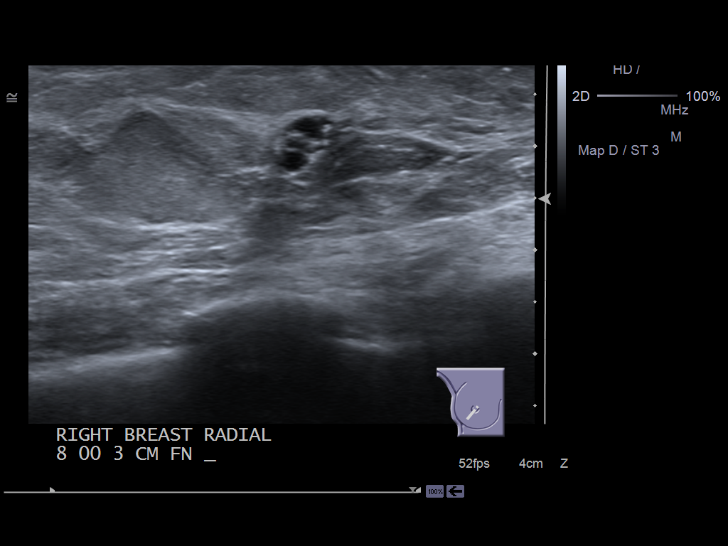
[im 3/22]
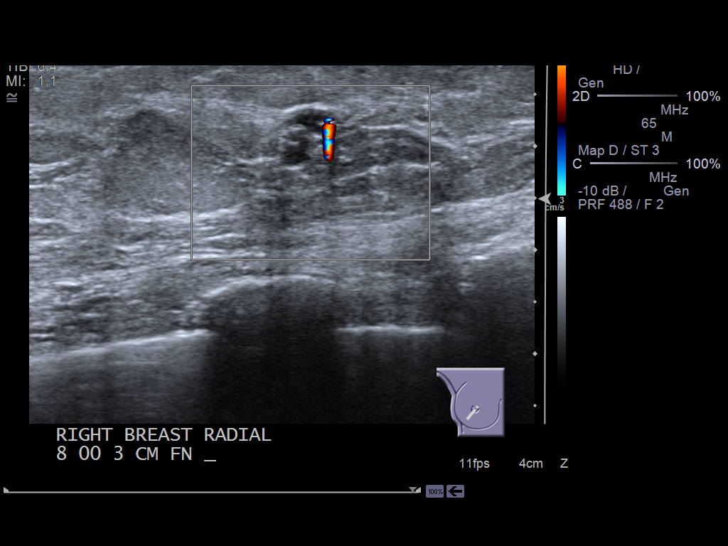
[im 4/22]
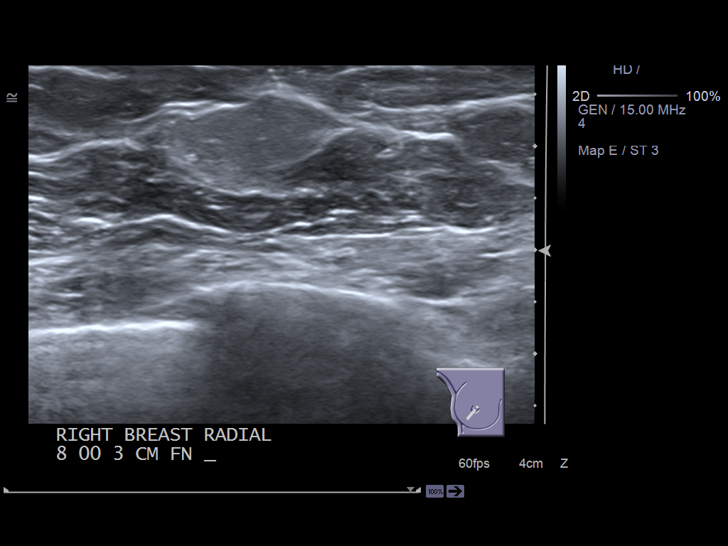
[im 6/22]
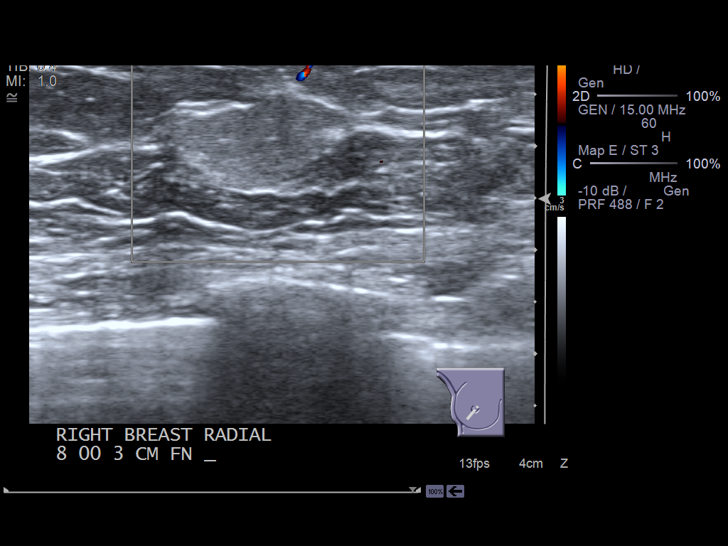
[im 8/22]
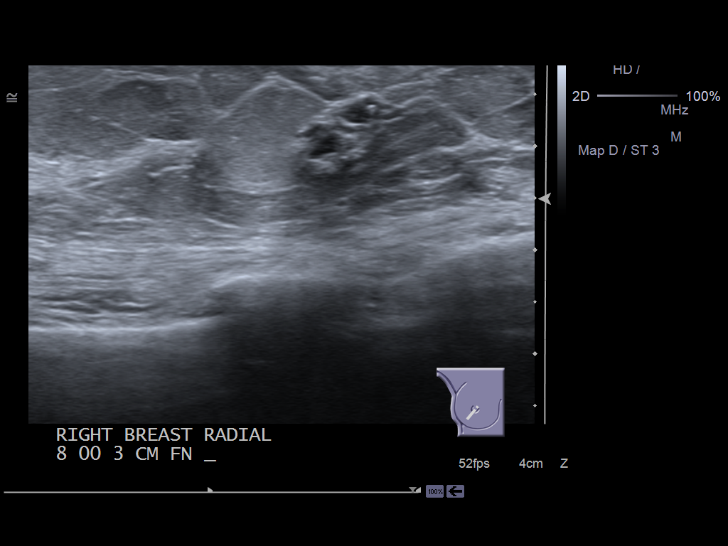
[im 9/22]
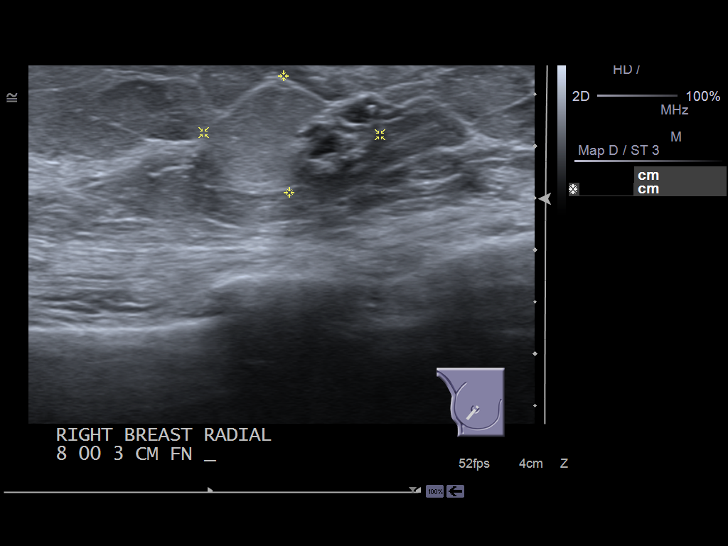
[im 11/22]
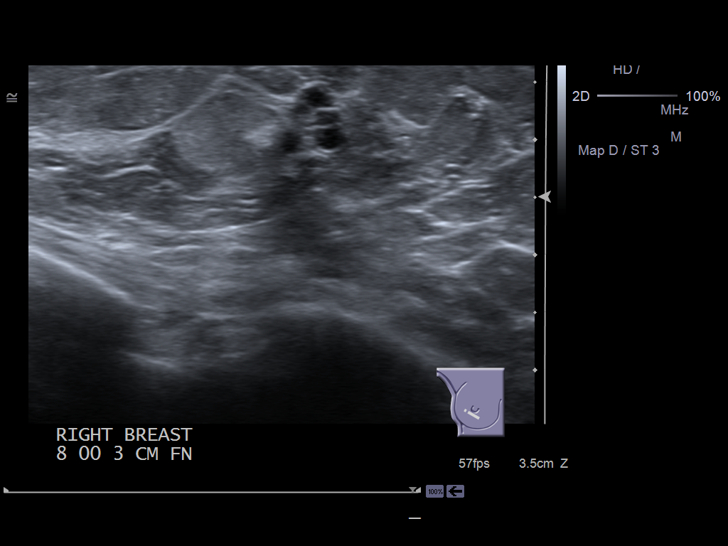
[im 12/22]
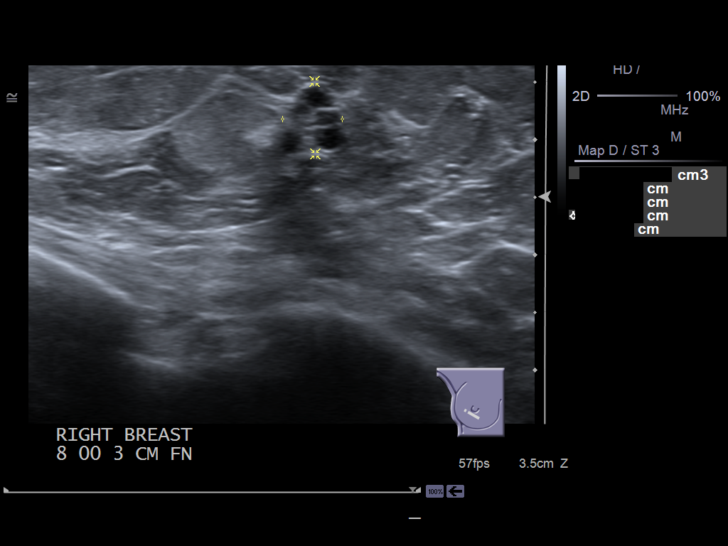
[im 14/22]
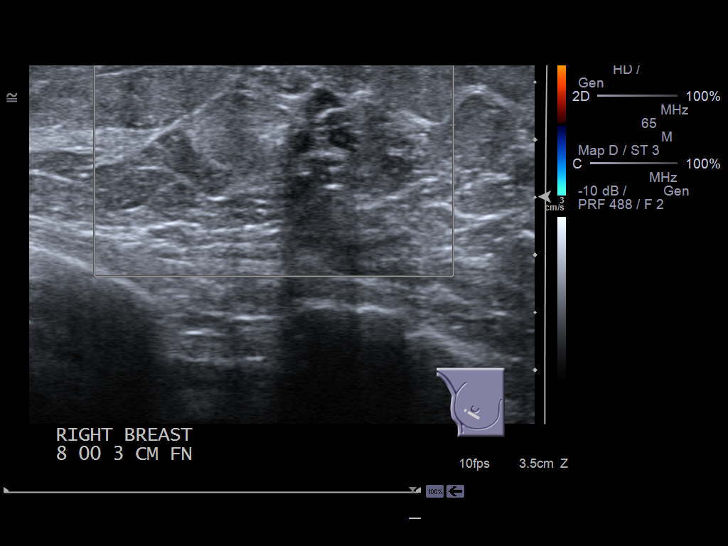
[im 15/22]
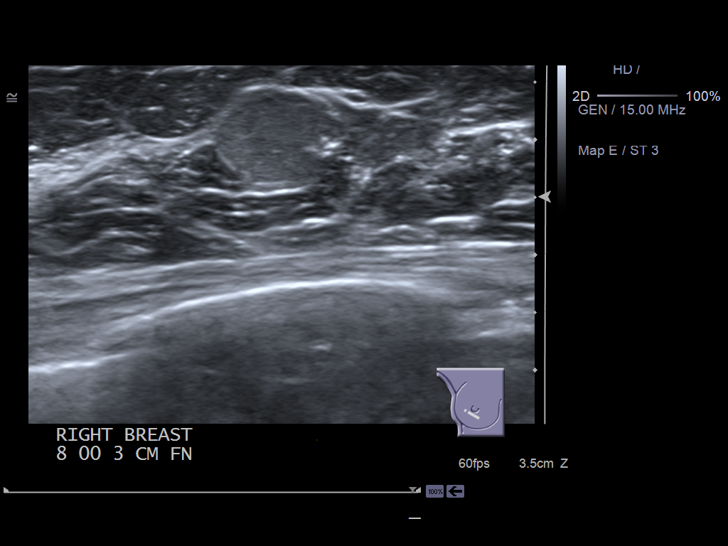
[im 17/22]
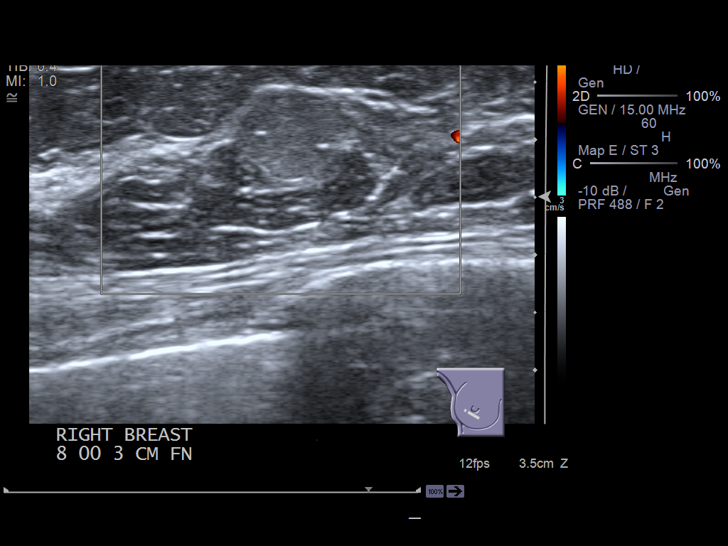
[im 19/22]
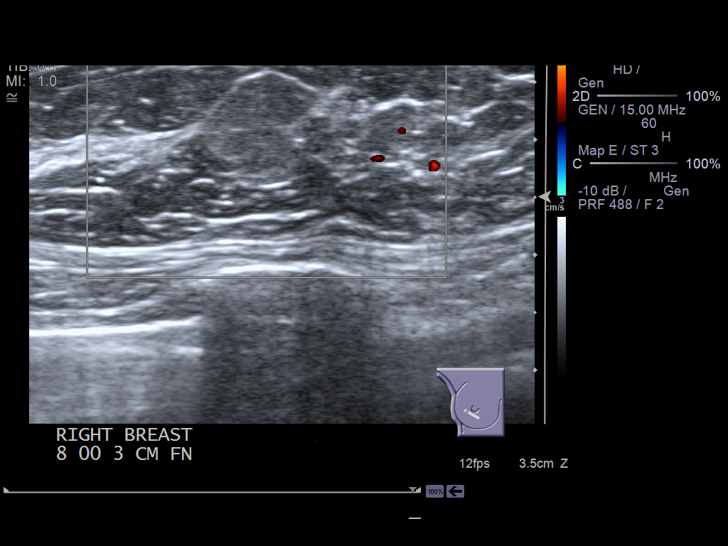
[im 20/22]
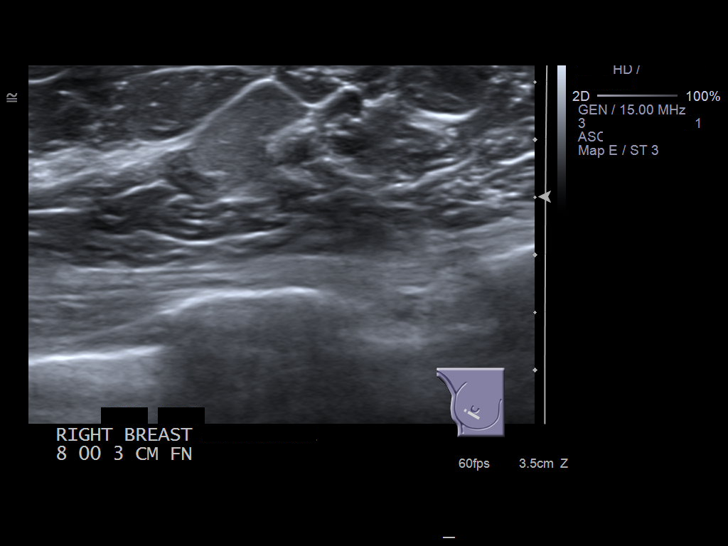
[im 22/22]
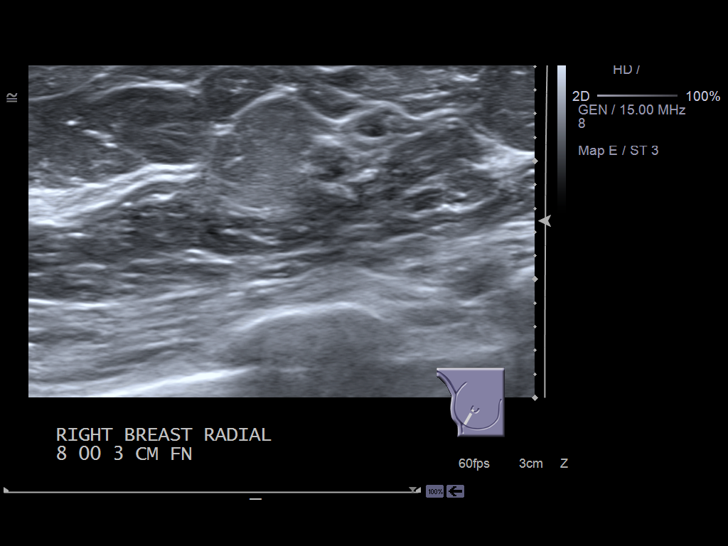

[14 of 22 positions shown; findings below may reference images not displayed]

PROCEDURE:     US  - US BREAST RIGHT  - January 19, 2013 [DATE]

RESULT:     Repeat ultrasound of the right breast [DATE] region 3 cm from the
nipple again shows the mixed echotexture with a moderately large fatty area
of echogenicity and adjacent small cystic areas with a total overall size of
1.73 x 0.96 x 1.16 cm. The cystic portion measures 0.55 x 0.69 x 0.5 to
centimeters. This appears to correlate again with the mammographically
stable density.
IMPRESSION: Stable mixed echotexture mass in the right breast [DATE]
region 3 cm from the nipple.

[REDACTED]

## 2014-09-14 IMAGING — MG MM MAMMO DIAGNOSTIC UNILATERAL*R*
1 series · 7 of 7 positions shown · non-contrast
Comparison: none

REASON FOR EXAM: RT BRST NODULAR DENSITY FU
COMMENTS:

[R CC · right · 7 of 7 slices shown]
[im 1/7]
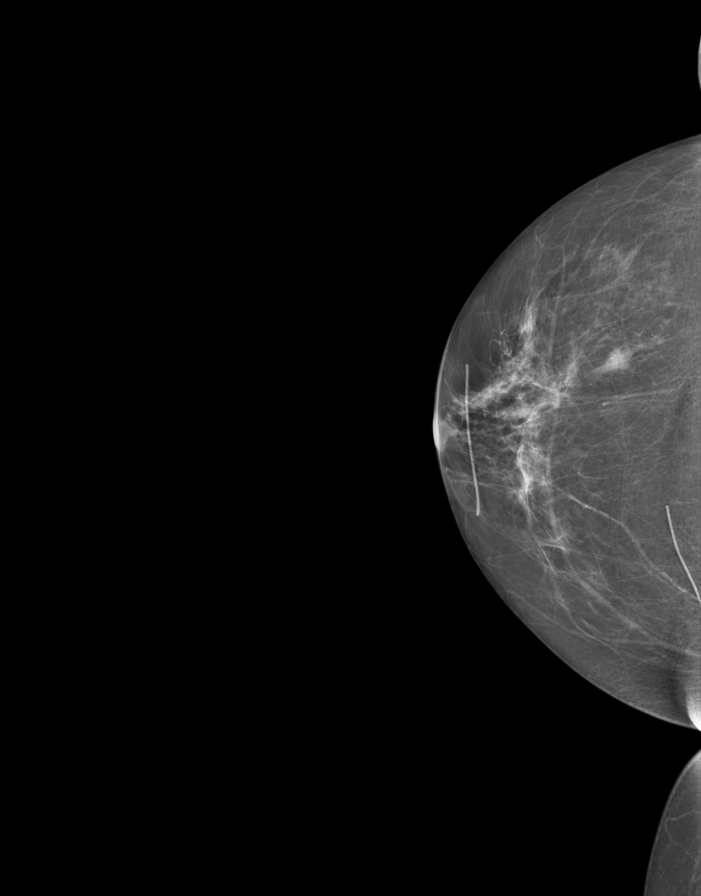
[im 2/7]
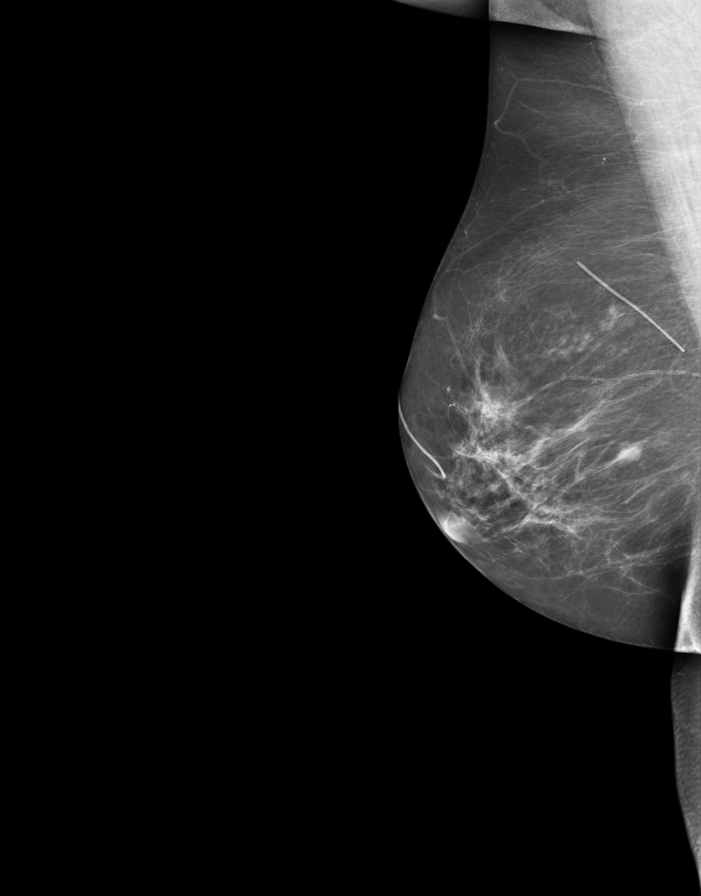
[im 3/7]
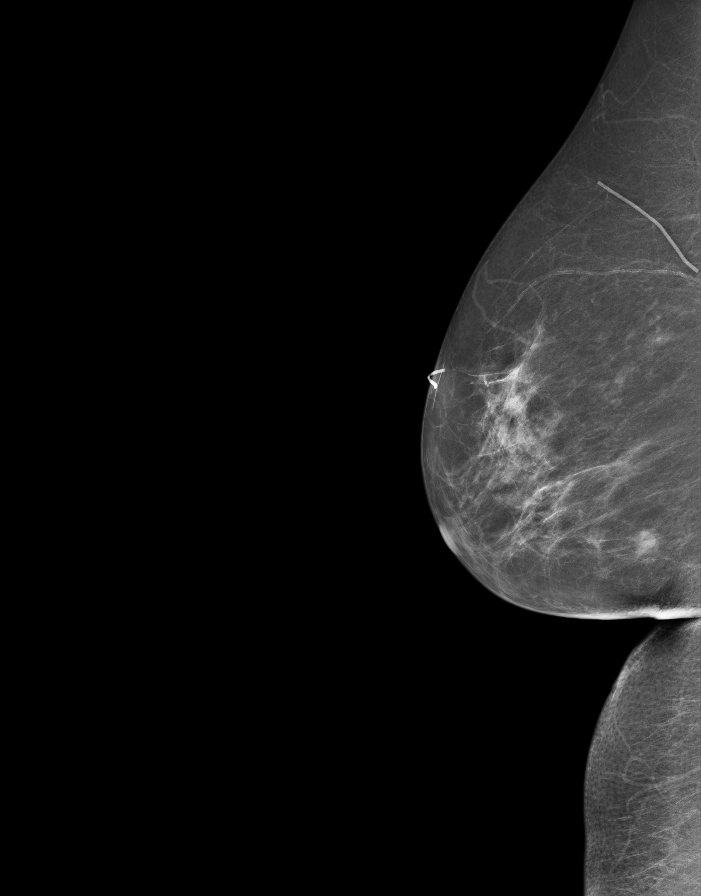
[im 4/7]
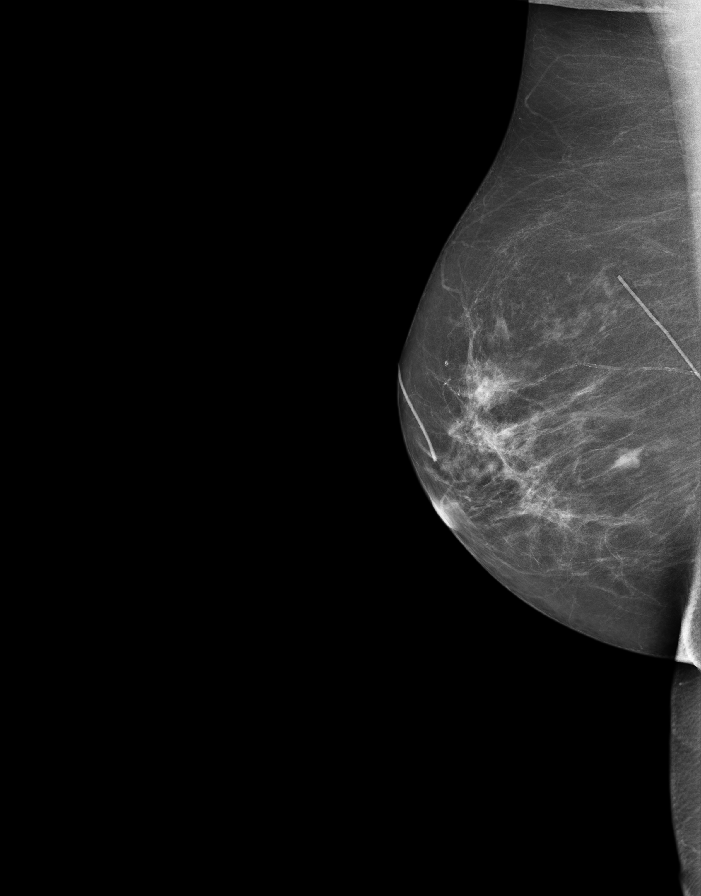
[im 5/7]
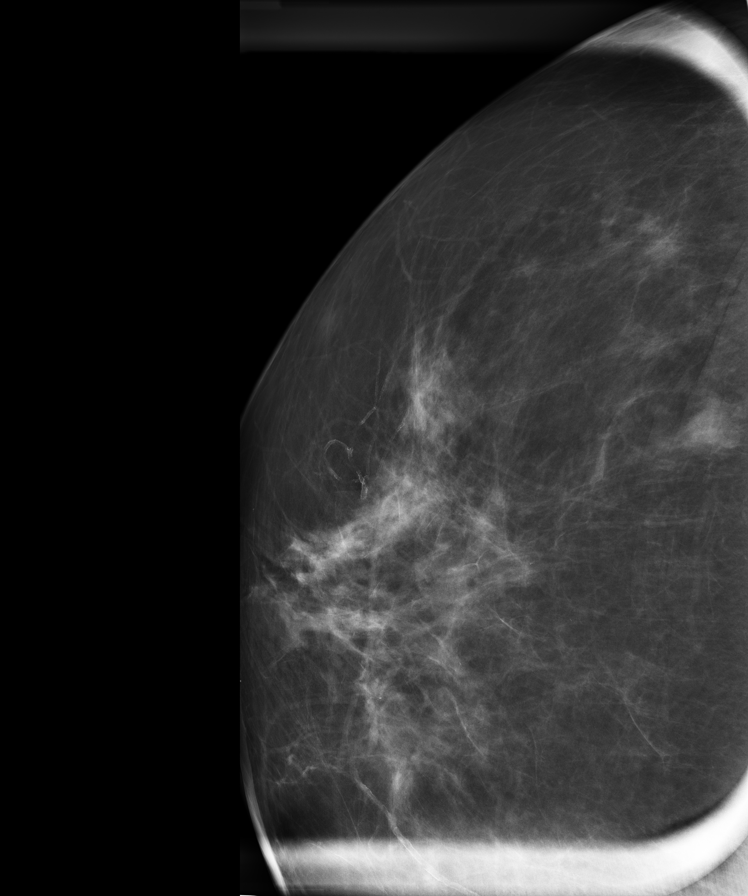
[im 6/7]
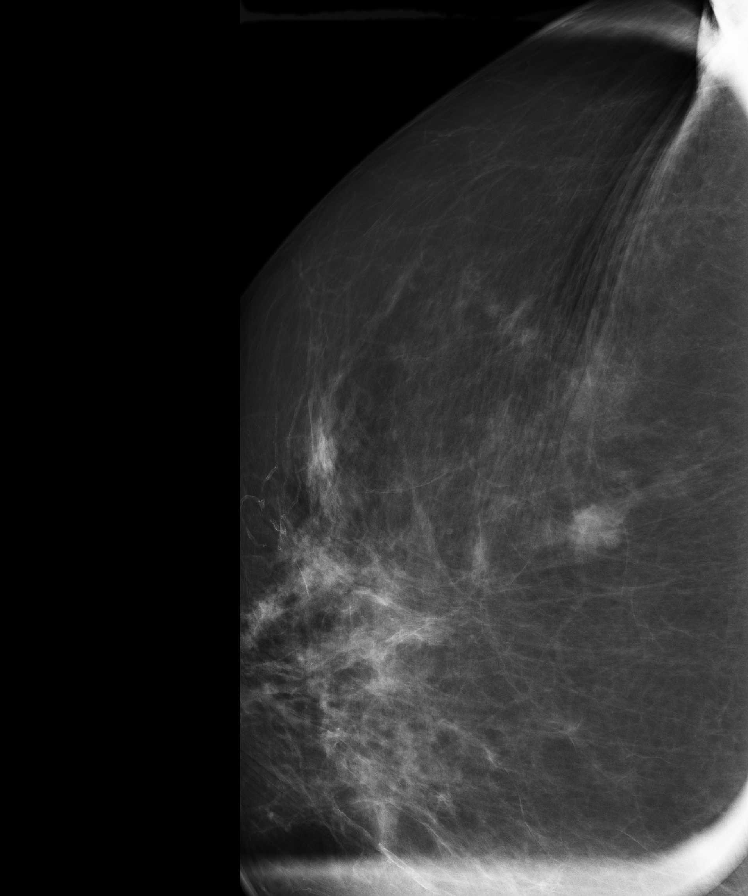
[im 7/7]
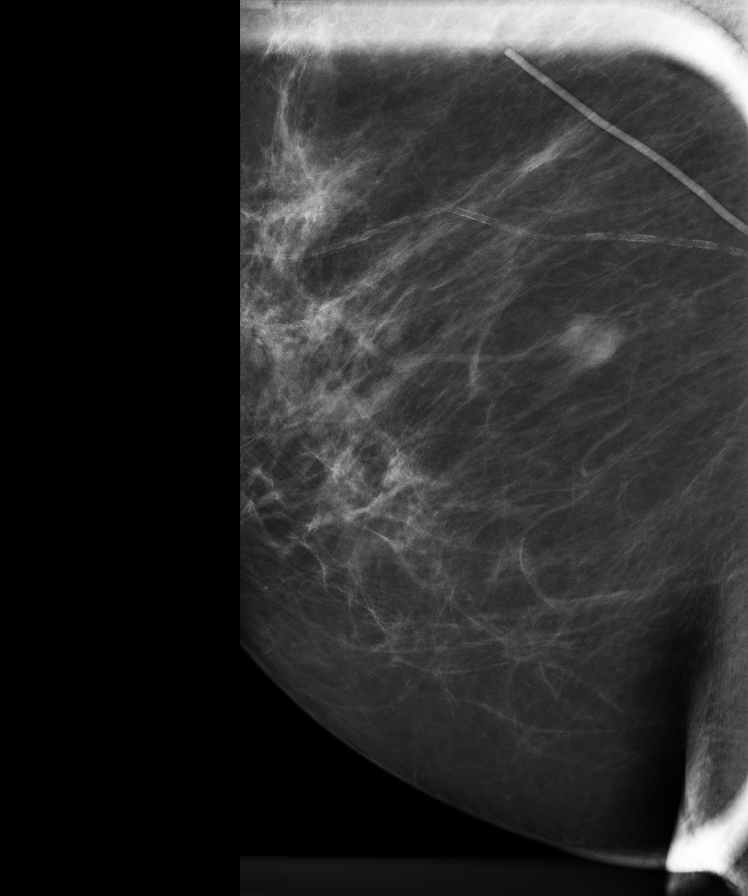

[7 of 7 positions shown; findings below may reference images not displayed]

PROCEDURE:     MAM - MAM DGTL UNI MAM RT BREAST W/CAD  - January 19, 2013  [DATE]

RESULT:     Unilateral right breast 6 month followup images are compared to
previous digital mammographic images of the right breast dated 07/10/2012 as
well as 06/07/2011, 10/03/2005, 05/22/2010, 05/18/2009 and 06/05/2006 area
there is a scar marker in the upper posterior central right breast and in
the supra-areolar region anteriorly. The parenchyma shows a heterogeneous
pattern of attenuation. No new architectural distortion is present. There is
stable irregularly marginated nodular density in the [DATE] region compared to
previous studies. The ultrasound is stable as well. A images demonstrate no
new nodular density.
IMPRESSION: Stable irregular nodular density in the [DATE] region of the
right breast. BI-RADS: Category 2- Benign Finding

A NEGATIVE MAMMOGRAM REPORT DOES NOT PRECLUDE BIOPSY OR OTHER EVALUATION OF
A CLINICALLY PALPABLE OR OTHERWISE SUSPICIOUS MASS OR LESION. BREAST CANCER
MAY NOT BE DETECTED IN UP TO 10% OF CASES.

Thank you for the oppurtunity to contribute to the care of your patient.
BREAST COMPOSITION: The breast composition is HETEROGENEOUSLY DENSE
(glandular tissue is 51-75%) This may decrease the sensitivity of
mammography.

[REDACTED]

## 2014-09-17 ENCOUNTER — Telehealth: Payer: Self-pay | Admitting: Internal Medicine

## 2014-09-17 DIAGNOSIS — K921 Melena: Secondary | ICD-10-CM

## 2014-09-17 NOTE — Telephone Encounter (Signed)
Pt called and stated that she was seen by [facility] and she was told that Dr. Nicki Reaper need to put in a referral to Dr. Andree Moro. Please advise pt/msn

## 2014-09-18 NOTE — Telephone Encounter (Signed)
Need to know what the referral is for.  She has a history of colon cancer, but according to our records she had a colonoscopy 06/2013 and the recommended f/u colonoscopy in three years.  Is she having problems?

## 2014-09-20 NOTE — Telephone Encounter (Signed)
Was told by the cancer clinic that she had blood in stool & that her PCP needs to set up the referral to GI.

## 2014-09-20 NOTE — Telephone Encounter (Signed)
Order placed for GI referral.   

## 2014-09-21 ENCOUNTER — Other Ambulatory Visit: Payer: Self-pay | Admitting: Internal Medicine

## 2014-09-24 ENCOUNTER — Other Ambulatory Visit: Payer: Self-pay | Admitting: Oncology

## 2014-09-24 DIAGNOSIS — R911 Solitary pulmonary nodule: Secondary | ICD-10-CM

## 2014-09-24 DIAGNOSIS — J984 Other disorders of lung: Secondary | ICD-10-CM

## 2014-10-01 NOTE — H&P (Signed)
PATIENT NAME:  Victoria Hanson, Victoria Hanson MR#:  [id number] DATE OF BIRTH:  [birth year]  DATE OF ADMISSION:  12/12/2012  PRIMARY CARE PHYSICIAN: Einar Pheasant, M.D.  REFERRING PHYSICIAN:  Lavonia Drafts, M.D.    CHIEF COMPLAINT: Chest pain.   HISTORY OF PRESENT ILLNESS:  Victoria Hanson is a 64 year old Caucasian female who was in her usual state of health until today around lunchtime when she developed midsternal chest pain, sharp in nature, severity was 5 on a scale of 10, continuous in nature, for several hours until she came in this evening to the Emergency Department for evaluation. She also indicates that the pain is worse upon walking. There is also some GI component that she feels; some bloating and belching. There is also musculoskeletal component. The area where she has the pain shows localized tenderness upon pressing on the sternal area. Nevertheless, her EKG is abnormal, but this is unchanged compared to her old EKG. She was also found to have hypokalemia. The patient was admitted as observation to follow up on her chest pain and followup on cardiac enzymes and she is also scheduled to have stress test.   REVIEW OF SYSTEMS:  CONSTITUTIONAL: Denies any fever. No chills. No fatigue.  EYES: No blurring of vision. No double vision.  ENT: No hearing impairment. No sore throat. No dysphagia.  CARDIOVASCULAR: Reports chest pain as above. No shortness of breath. No edema. No syncope.  RESPIRATORY: No shortness of breath. No cough. No sputum production.  GASTROINTESTINAL: Shows bloating and belching. No abdominal pain, no vomiting, but she has mild nausea. She has no diarrhea, but loose stool and this is chronic for her since her cholecystectomy.  GENITOURINARY: No dysuria. No frequency of urination.  MUSCULOSKELETAL: No joint pain or swelling. No muscular pain or swelling.  INTEGUMENTARY: No skin rash. No ulcers.  NEUROLOGY: No focal weakness. No seizure activity. No headache.  PSYCHIATRY: She has anxiety  and depression; this is chronic.   ENDOCRINE: No polyuria or polydipsia. No heat or cold intolerance.   PAST MEDICAL HISTORY: Systemic hypertension, panic attacks and anxiety disorder.  History of colon cancer status post resection followed up Dr. Oliva Bustard.  History of depression.   PAST SURGICAL HISTORY: Hysterectomy, cholecystectomy, colon resection, right upper lobe lung resection.   SOCIAL HISTORY: She is widowed and lives at home with her daughter and son and her granddaughter.   FAMILY HISTORY: Her father died from complications of lung cancer. Her mother died from complications of a heart attack at the age of 1.   ADMISSION MEDICATIONS: Aspirin 81 mg a day, hydrochlorothiazide with triamterene 25/37.5 1 tablet once a day, potassium chloride 20 mEq a day, Lexapro, she takes 10 mg 1/2 tablet, that is 5 mg a day, Prilosec 20 mg a day, Xanax 0.5 mg as needed.   ALLERGIES: FLAGYL CAUSING CHEST PAIN, CELEBREX AND ASPIRIN HAS SIDE EFFECTS CAUSING SOME GI DISTURBANCES AND MACROBID.    PHYSICAL EXAMINATION: VITAL SIGNS: Blood pressure 129/69, respiratory 16, pulse 67, temperature 98.2 oxygen saturation 96%.  GENERAL APPEARANCE: Elderly female lying in bed, in no acute distress.  HEAD AND NECK: No pallor. No icterus. No cyanosis. Ear examination revealed normal hearing, no discharge, no lesions. Examination of the nose showed no discharge, no bleeding, no ulcers. Oropharyngeal initially revealed normal lips and tongue, no oral thrush, no ulcers. Eye examination revealed normal eyelids and conjunctivae.  Pupils were about 3 to 4 mm, round, equal and sluggishly reactive to light. NECK: Supple. Trachea at midline. No thyromegaly.  No cervical lymphadenopathy. No masses.  HEART: Distant heart sounds. No murmur was appreciated. Faint S1 and S2. No S3 or S4. No gallop. No carotid bruits.  RESPIRATORY: Normal breathing pattern without use of accessory muscles. No rales. No wheezing.  ABDOMEN: Soft without  tenderness. No hepatosplenomegaly. No masses. No hernias.  SKIN: No ulcers. No subcutaneous nodules.  MUSCULOSKELETAL: No joint swelling. No clubbing.   NEUROLOGIC: Cranial nerves II through XII are intact. No focal motor deficit.  PSYCHIATRIC: The patient is alert and oriented x 3. Mood and affect were normal.   LABORATORY FINDINGS: Her chest x-ray showed no acute cardiopulmonary abnormalities. Her EKG showed normal sinus rhythm at rate of 76 per minute. ST-T wave abnormalities; these are diffuse. This is unchanged compared to her old EKG.  Her serum glucose 128, BUN 19, creatinine 1.5, sodium 139, potassium 2.9, estimated GFR is 37. Troponin less than 0.02. CBC showed white count of 10,000, hemoglobin 12, hematocrit 35, platelet count 233, prothrombin time 13. INR 1.   ASSESSMENT: 1.  Chest pain for evaluation, with abnormal EKG;  although it is unchanged. Her  first troponin is normal. There are some musculoskeletal components for the pain, being there is localized tenderness, also, there are some gastrointestinal component as well.  2.  Systemic hypertension.  3.  Hypokalemia. 4.  Panic attacks and anxiety disorder.  5.  Chronic kidney disease, stage 3.  6.  History of colon cancer, status post resection.   PLAN: We will admit the patient as observation on telemetry, follow up on cardiac enzymes. Potassium supplementation to correct the hypokalemia. Repeat BNP tomorrow. I scheduled the patient to have Myoview stress test. Oxygen supplementation. Continuous low dose aspirin. For deep vein thrombosis prophylaxis, will place her on Lovenox 30 mg subcutaneous  once a day. Continue omeprazole as she is unsure if she missed a dose; this may have exacerbated the gastrointestinal component. Maalox p.r.n. to see if there is any response. Xanax p.r.n. for her anxiety. The patient does not have a LIVING WILL, but she states that she is FULL CODE, unless she has the condition of being in a vegetative state,  then she does not want to be prolonged on life support.   Time spent in evaluating this patient took more than one hour.   ____________________________ Clovis Pu. Lenore Manner, MD amd:nts D: 12/12/2012 23:49:06 ET T: 12/13/2012 00:08:03 ET JOB#: [id number]  cc: Clovis Pu. Lenore Manner, MD, <Dictator> Mike Craze Irven Coe MD ELECTRONICALLY SIGNED 12/13/2012 22:09

## 2014-10-01 NOTE — Discharge Summary (Signed)
PATIENT NAME:  Victoria Hanson, Victoria Hanson MR#:  [id number] DATE OF BIRTH:  1951-04-26  DATE OF ADMISSION:  12/13/2012 DATE OF DISCHARGE:  12/13/2012  DISCHARGE DIAGNOSIS:  Chest pain secondary musculoskeletal origin.  OTHER DIAGNOSES INCLUDE:  Hypertension, a history of anxiety, depression.   DISCHARGE MEDICATIONS:  Prilosec 20 mg p.o. daily, KCl 20 mEq p.o. daily, Lexapro 10 mg half tablet once a day, Xanax 0.5 mg as needed for anxiety, hydrochlorothiazide/triamterene 25/37.5 mg p.o. daily, multivitamin 1 tablet p.o. daily, aspirin 81 mg p.o. daily.   CONSULTATIONS:  None.   PROCEDURES:  Stress test.   HOSPITAL COURSE:  The patient is a 64 year old female patient admitted for chest pain. Look at the history and physical for full details. The patient was working on [employer]. After that suddenly noticed chest pain and the patient was admitted for chest pain. Rule out troponins have been negative x 3. EKG showed normal sinus at 76 beats and diffuse ST-T changes. The patient had a Lexiscan stress test, which showed EF of around 70%, with no perfusion defects. The patient has no significant wall motion abnormalities and low risk scan. The patient does have tenderness to palpation on the chest. I told her it could be musculoskeletal and advised her to continue her home medications.   DISCHARGE VITAL SIGNS: Temperature is 97.4, heart rate 60, blood pressure 116/77, sats 98% on room air.   PRIMARY DOCTOR:  Dr. Einar Pheasant.  TIME SPENT:  More than 30 minutes.  ____________________________ Epifanio Lesches, MD sk:jm D: 12/13/2012 13:43:00 ET T: 12/13/2012 13:54:31 ET JOB#: [id number]  cc: Epifanio Lesches, MD, <Dictator> Einar Pheasant, MD Epifanio Lesches MD ELECTRONICALLY SIGNED 12/22/2012 20:25

## 2014-10-09 IMAGING — MR MRI HEAD WITHOUT CONTRAST
6 series · 41 of 48 positions shown · non-contrast
Comparison: none

REASON FOR EXAM: expressive ephagia
COMMENTS:

PROCEDURE:     MR  - MR BRAIN WO CONTRAST  - February 13, 2013  [DATE]
RESULT:     History: Aphasia
Comparison Study: No prior.

[Series 2: t1_se_sag · axial · 10.0mm · 0.55mm/px · z∈[+0,+97]mm · 8 of 28 slices shown]
[im 1/28]
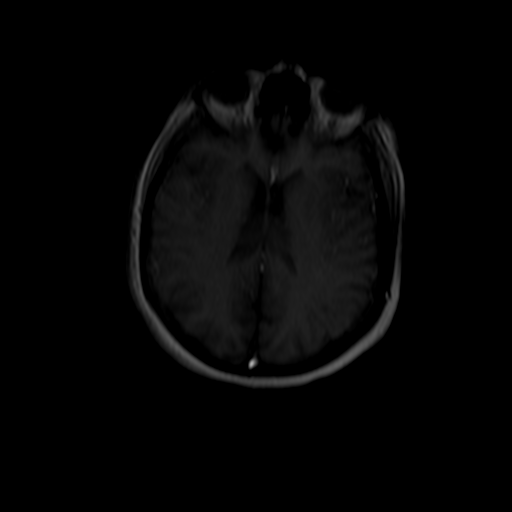
[im 4/28]
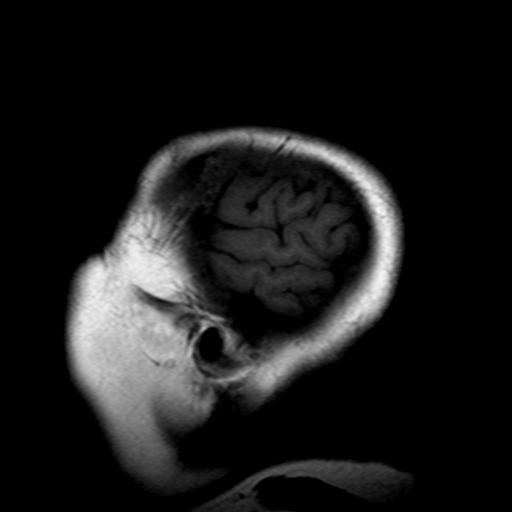
[im 8/28]
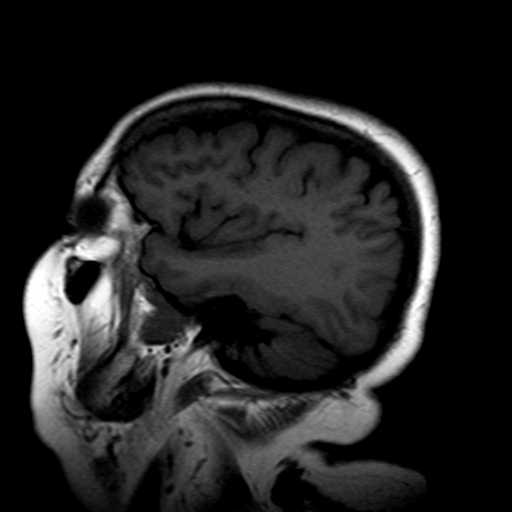
[im 12/28]
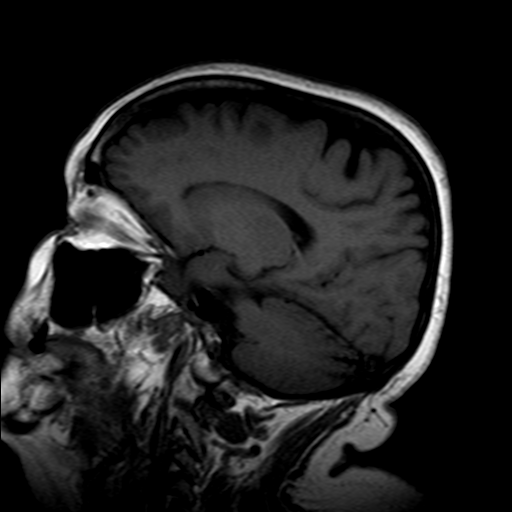
[im 16/28]
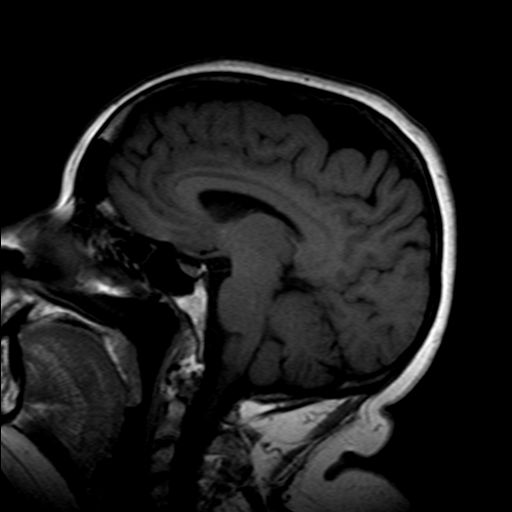
[im 20/28]
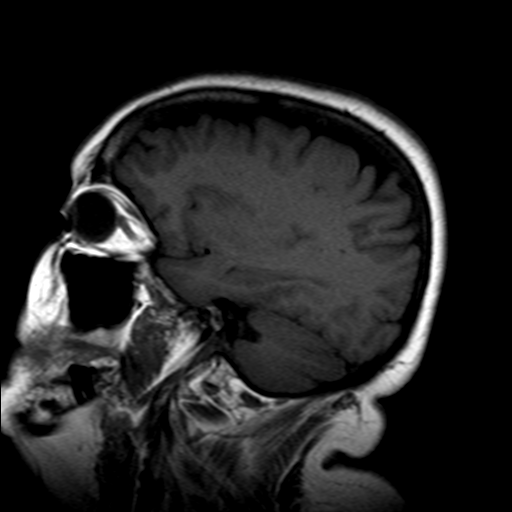
[im 24/28]
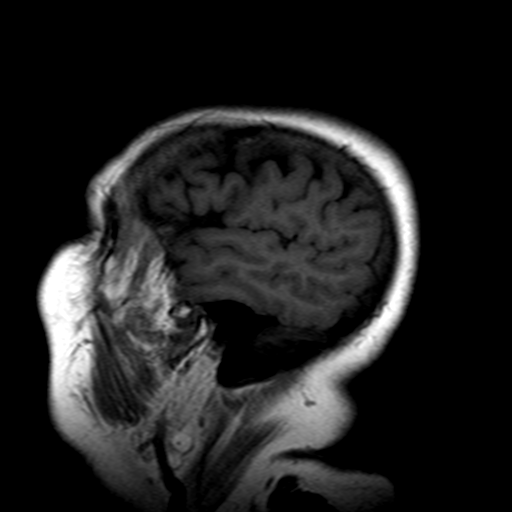
[im 28/28]
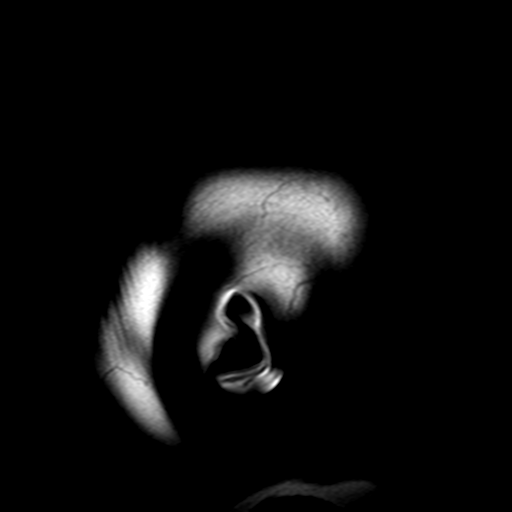

[Series 10: t1_se_axial · axial · 5.0mm · 0.45mm/px · 1 of 25 slices shown]
[im 1/25]
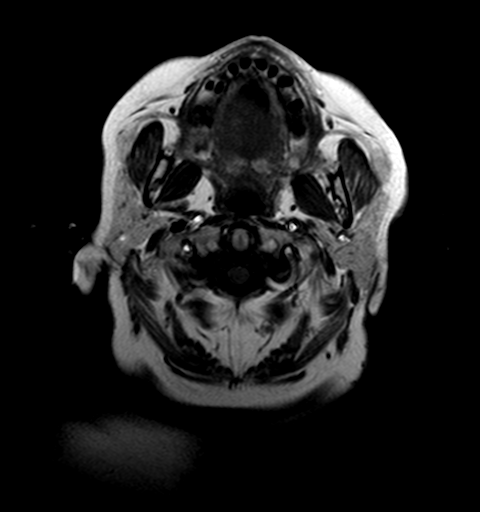

[Series 12: T2 · axial · 5.0mm · 0.45mm/px · z∈[-39,+97]mm · 8 of 25 slices shown]
[im 1/25]
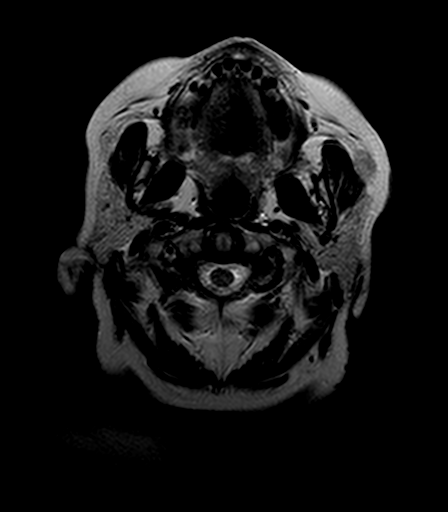
[im 4/25]
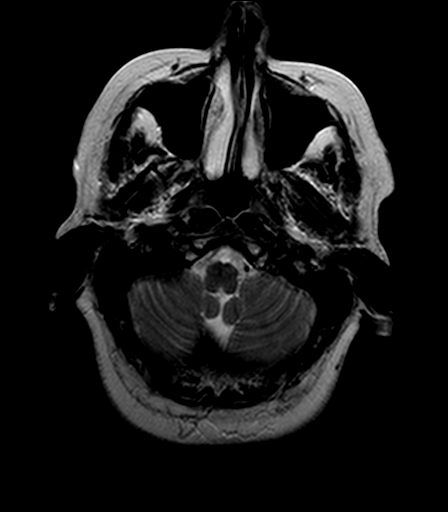
[im 7/25]
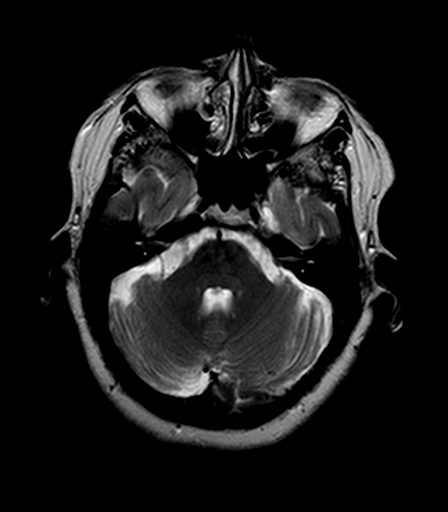
[im 11/25]
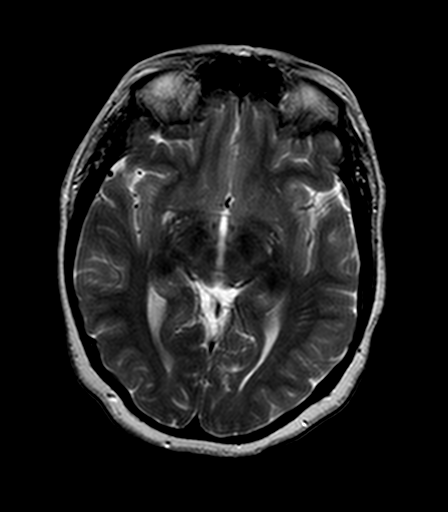
[im 14/25]
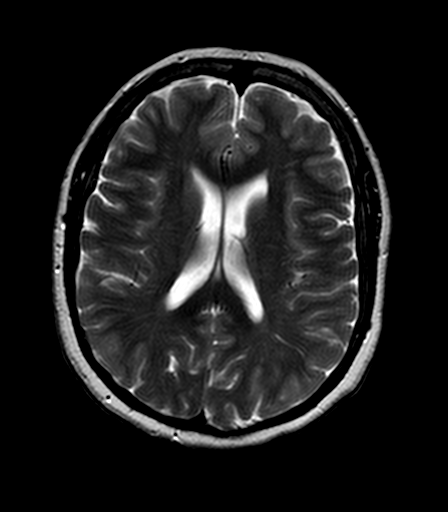
[im 18/25]
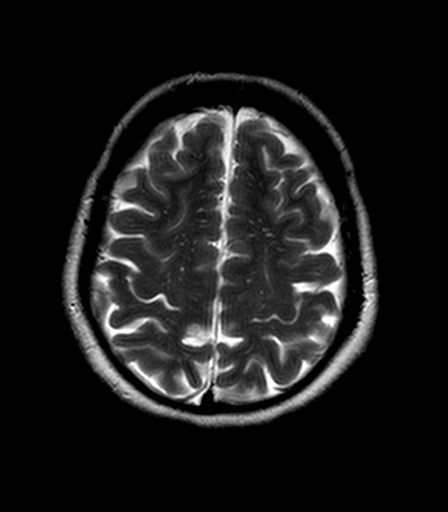
[im 21/25]
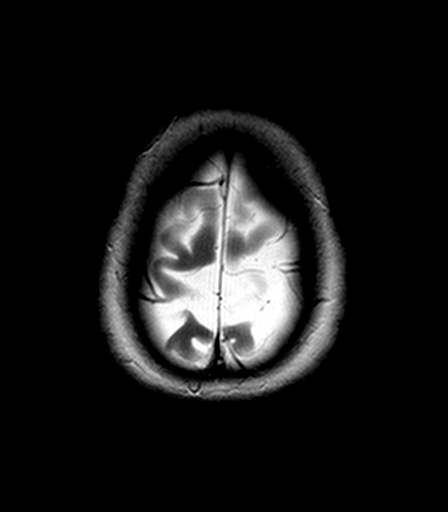
[im 25/25]
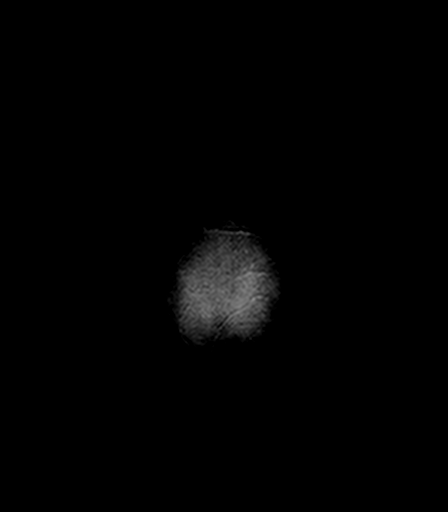

[Series 14: FLAIR · axial · 5.0mm · 0.90mm/px · z∈[-39,+98]mm · 8 of 25 slices shown]
[im 1/25]
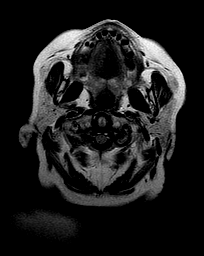
[im 4/25]
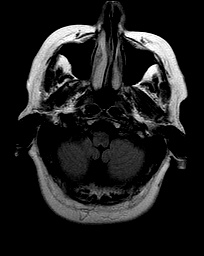
[im 7/25]
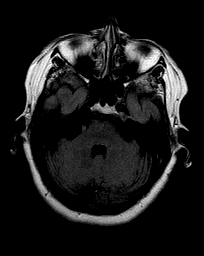
[im 11/25]
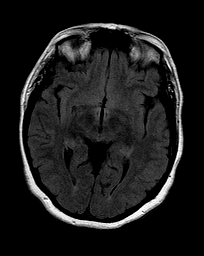
[im 14/25]
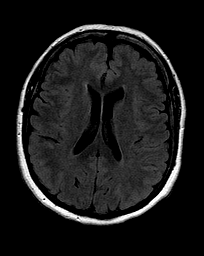
[im 18/25]
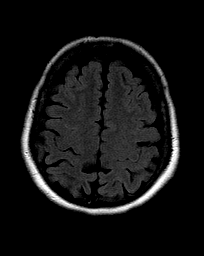
[im 21/25]
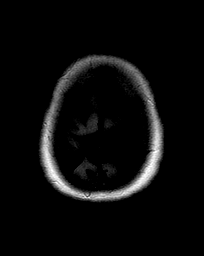
[im 25/25]
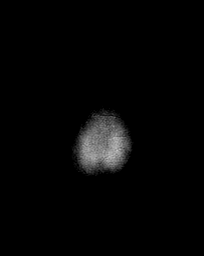

[Series 5002: DWI · axial · 5.0mm · 1.80mm/px · z∈[-34,+97]mm · 8 of 24 slices shown]
[im 1/24]
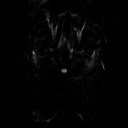
[im 4/24]
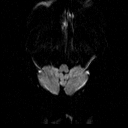
[im 7/24]
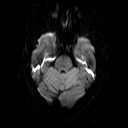
[im 10/24]
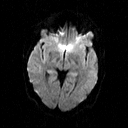
[im 14/24]
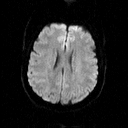
[im 17/24]
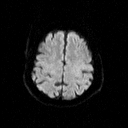
[im 20/24]
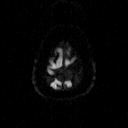
[im 24/24]
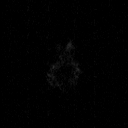

[Series 5004: ADC · axial · 5.0mm · 1.80mm/px · z∈[-40,+97]mm · 8 of 25 slices shown]
[im 1/25]
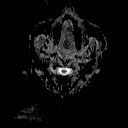
[im 4/25]
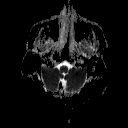
[im 7/25]
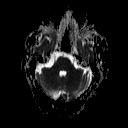
[im 11/25]
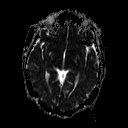
[im 14/25]
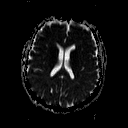
[im 18/25]
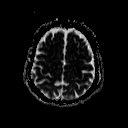
[im 21/25]
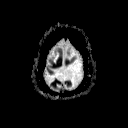
[im 25/25]
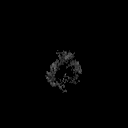

[41 of 48 positions shown; findings below may reference images not displayed]

FINDINGS: Multiplanar, multisequence images brain is obtained.
Diffusion-weighted images reveal no evidence of acute ischemia. Matter
changes noted consistent with chronic ischemia these findings are subtle.
Vascular flow voids are normal. VIII nerve complexes are normal. Orbits are
normal.
IMPRESSION: Mild changes of chronic white matter ischemia. No acute or
focal abnormality identified.

## 2014-10-18 ENCOUNTER — Encounter: Payer: Self-pay | Admitting: Nurse Practitioner

## 2014-10-18 ENCOUNTER — Ambulatory Visit (INDEPENDENT_AMBULATORY_CARE_PROVIDER_SITE_OTHER): Payer: Medicare PPO | Admitting: Nurse Practitioner

## 2014-10-18 VITALS — BP 110/72 | HR 73 | Temp 98.4°F | Resp 14 | Ht 64.0 in | Wt 200.1 lb

## 2014-10-18 DIAGNOSIS — E876 Hypokalemia: Secondary | ICD-10-CM | POA: Diagnosis not present

## 2014-10-18 DIAGNOSIS — R142 Eructation: Secondary | ICD-10-CM

## 2014-10-18 DIAGNOSIS — R141 Gas pain: Secondary | ICD-10-CM

## 2014-10-18 DIAGNOSIS — Z131 Encounter for screening for diabetes mellitus: Secondary | ICD-10-CM

## 2014-10-18 DIAGNOSIS — R143 Flatulence: Secondary | ICD-10-CM

## 2014-10-18 DIAGNOSIS — N289 Disorder of kidney and ureter, unspecified: Secondary | ICD-10-CM

## 2014-10-18 DIAGNOSIS — R300 Dysuria: Secondary | ICD-10-CM | POA: Diagnosis not present

## 2014-10-18 LAB — POCT URINALYSIS DIPSTICK
Bilirubin, UA: NEGATIVE
Glucose, UA: NEGATIVE
Ketones, UA: NEGATIVE
Nitrite, UA: NEGATIVE
PROTEIN UA: NEGATIVE
SPEC GRAV UA: 1.01
UROBILINOGEN UA: 0.2
pH, UA: 5.5

## 2014-10-18 NOTE — Progress Notes (Signed)
Subjective:    Patient ID: Victoria Hanson, female    DOB: 1951-01-16, 64 y.o.   MRN: [id number]  HPI  Victoria Hanson is a 64 yo female with a CC of Gas and urinary frequency.   1) Gas- rumbling and excessive flatulence   3 weeks, GI appointment in June.   Takes anti-diarrhea medications- immodium generic  States it is loud when not taking the anti-diarrheals  Chewable medication for gas like tums- has not tried yet  Denies change to diet Dinner yesterday- Taco salad  Breakfast- oatmeal pack/apple cinnamon, Mellow Yellow  Lunch- No lunch today   Sodas 1 a day (16 oz) she reports  Some water, coffee  Diarrhea- watery 4-5   2) Swelling of her legs and feet is still happening daily   3) Dysuria and frequency- 3 weeks, feels it is running right through   Review of Systems  Constitutional: Negative for fever, chills, diaphoresis and fatigue.  Respiratory: Negative for chest tightness, shortness of breath and wheezing.   Cardiovascular: Positive for leg swelling.  Gastrointestinal: Positive for nausea, diarrhea and abdominal distention. Negative for vomiting, blood in stool and anal bleeding.  Genitourinary: Positive for dysuria and frequency. Negative for hematuria, flank pain and difficulty urinating.  Skin: Negative for rash.  Neurological: Negative for dizziness and headaches.   Past Medical History  Diagnosis Date  . Colon cancer 1999    Pt had partial colectomy. Did develop lung metastasis. Had right upper lobectomy in 01/2007 then had associated chemotherapy.  . Hypercholesterolemia   . Hypertension   . Depression   . Nephrolithiasis   . Elevated transaminase level     ? NASH  . GERD (gastroesophageal reflux disease)   . Holter monitor, abnormal 12/2009    NSR, rare PACs and PVCs  . Echocardiogram abnormal 02/2010    EF 55-60%, mild LAE, no regional wall motion abnormalities  . Lung cancer     S/P resection (lobectomy - right)  . COPD (chronic obstructive pulmonary  disease)     History   Social History  . Marital Status: Widowed    Spouse Name: N/A  . Number of Children: N/A  . Years of Education: N/A   Occupational History  . Not on file.   Social History Main Topics  . Smoking status: Never Smoker   . Smokeless tobacco: Never Used  . Alcohol Use: No  . Drug Use: No  . Sexual Activity: Not on file   Other Topics Concern  . Not on file   Social History Narrative   Widowed   Disabled   Gets regular exercise, walks and plays volleyball    Past Surgical History  Procedure Laterality Date  . Colon surgery      Colon cancer, removed part of large colon  . Lobectomy      Right lung, upper lobe right side removed  . Cardiac catheterization  2003    negative as per pt report  . Cholecystectomy  2000  . Tubal ligation  1980  . Total abdominal hysterectomy      abnormal bleeding  . Breast biopsy  10/07/2009    Right breast    Family History  Problem Relation Age of Onset  . Coronary artery disease Mother   . Heart attack Father 35    MI  . Cancer Father     lung  . Stroke Father   . Hypertension Father   . Sudden death Brother   . Mental illness  Mother   . Mental illness Brother   . Diabetes Mother     Allergies  Allergen Reactions  . Antihistamines, Diphenhydramine-Type     Other reaction(s): Other (See Comments) She does not know which antihistamine she has an allergic reaction to.  . Aspirin     Other reaction(s): Other (See Comments) Makes her sick on the stomach.  Of note, she can take ibuprofen.  . Ceftin [Cefuroxime Axetil] Other (See Comments)    Chest pains  . Celecoxib     Other reaction(s): Blood Disorder GI bleeding.  . Ciprofloxacin   . Cymbalta [Duloxetine Hcl]   . Lexapro [Escitalopram Oxalate]     Difficulty with speech  . Macrobid [Nitrofurantoin Macrocrystal]   . Metronidazole     Other reaction(s): Unknown  . Zoloft [Sertraline Hcl]     Current Outpatient Prescriptions on File Prior to  Visit  Medication Sig Dispense Refill  . ALPRAZolam (XANAX) 0.5 MG tablet Take 1 tablet (0.5 mg total) by mouth daily as needed for anxiety. (Patient taking differently: Take 0.5 mg by mouth daily as needed for anxiety. ) 90 tablet 0  . aspirin 81 MG tablet Take 81 mg by mouth daily.    . mometasone (NASONEX) 50 MCG/ACT nasal spray Place 2 sprays into the nose daily. 17 g 6  . Multiple Vitamin (MULTIVITAMIN) tablet Take 1 tablet by mouth daily.    Marland Kitchen omeprazole (PRILOSEC) 20 MG capsule TAKE ONE (1) CAPSULE EACH DAY 30 capsule 6  . potassium chloride SA (K-DUR,KLOR-CON) 20 MEQ tablet TAKE ONE TABLET TWICE DAILY 60 tablet 3  . PROAIR HFA 108 (90 BASE) MCG/ACT inhaler INHALE TWO PUFFS INTO THE LUNGS EVERY 6 HOURS AS NEEDED FOR WHEEZING 8.5 g 3  . traZODone (DESYREL) 50 MG tablet TAKE TWO AND ONE-HALF TABLETS AT BEDTIME 90 tablet 2  . triamterene-hydrochlorothiazide (MAXZIDE-25) 37.5-25 MG per tablet TAKE ONE (1) TABLET EACH DAY 30 tablet 6   No current facility-administered medications on file prior to visit.      Objective:   Physical Exam  Constitutional: She is oriented to person, place, and time. She appears well-developed and well-nourished. No distress.  BP 110/72 mmHg  Pulse 73  Temp(Src) 98.4 F (36.9 C) (Oral)  Resp 14  Ht '5\' 4"'$  (1.626 m)  Wt 200 lb 1.9 oz (90.774 kg)  BMI 34.33 kg/m2  SpO2 96%   HENT:  Head: Normocephalic and atraumatic.  Right Ear: External ear normal.  Left Ear: External ear normal.  Eyes: Right eye exhibits no discharge. Left eye exhibits no discharge. No scleral icterus.  Cardiovascular: Normal rate, regular rhythm and normal heart sounds.  Exam reveals no gallop and no friction rub.   No murmur heard. Pulmonary/Chest: Effort normal and breath sounds normal. No respiratory distress. She has no wheezes. She has no rales. She exhibits no tenderness.  Abdominal: Soft. Bowel sounds are normal. She exhibits no distension and no mass. There is no tenderness.  There is no rebound and no guarding.  Neurological: She is alert and oriented to person, place, and time. No cranial nerve deficit. She exhibits normal muscle tone. Coordination normal.  Skin: Skin is warm and dry. No rash noted. She is not diaphoretic.  Psychiatric: She has a normal mood and affect. Her behavior is normal. Judgment and thought content normal.      Assessment & Plan:

## 2014-10-18 NOTE — Progress Notes (Signed)
Pre visit review using our clinic review tool, if applicable. No additional management support is needed unless otherwise documented below in the visit note. 

## 2014-10-18 NOTE — Patient Instructions (Signed)
Try the probiotics for digestion help until you see GI in June.   Nature's bounty probiotics are 7 dollars at [employer] for a 100 count.   Lactaid is milk without the lactose. (Drinking form or pill form over the counter)  Drink more water and less Soda.   Please visit the lab before leaving today.

## 2014-10-19 LAB — COMPREHENSIVE METABOLIC PANEL
ALT: 14 U/L (ref 0–35)
AST: 19 U/L (ref 0–37)
Albumin: 3.6 g/dL (ref 3.5–5.2)
Alkaline Phosphatase: 99 U/L (ref 39–117)
BUN: 17 mg/dL (ref 6–23)
CO2: 24 mEq/L (ref 19–32)
CREATININE: 1.4 mg/dL — AB (ref 0.40–1.20)
Calcium: 9.7 mg/dL (ref 8.4–10.5)
Chloride: 104 mEq/L (ref 96–112)
GFR: 40.12 mL/min — ABNORMAL LOW (ref >60.00)
Glucose, Bld: 94 mg/dL (ref 70–99)
POTASSIUM: 4 meq/L (ref 3.5–5.1)
SODIUM: 138 meq/L (ref 135–145)
Total Bilirubin: 0.3 mg/dL (ref 0.2–1.2)
Total Protein: 7.1 g/dL (ref 6.0–8.3)

## 2014-10-19 LAB — HEMOGLOBIN A1C: Hgb A1c MFr Bld: 6 % (ref 4.6–6.5)

## 2014-10-20 LAB — URINE CULTURE

## 2014-10-21 ENCOUNTER — Other Ambulatory Visit: Payer: Self-pay | Admitting: Nurse Practitioner

## 2014-10-21 MED ORDER — AMOXICILLIN-POT CLAVULANATE 875-125 MG PO TABS: 1.0000 | ORAL_TABLET | Freq: Two times a day (BID) | ORAL | Status: DC

## 2014-10-24 DIAGNOSIS — R142 Eructation: Secondary | ICD-10-CM

## 2014-10-24 DIAGNOSIS — R141 Gas pain: Secondary | ICD-10-CM | POA: Insufficient documentation

## 2014-10-24 DIAGNOSIS — R300 Dysuria: Secondary | ICD-10-CM | POA: Insufficient documentation

## 2014-10-24 DIAGNOSIS — R143 Flatulence: Secondary | ICD-10-CM

## 2014-10-24 NOTE — Assessment & Plan Note (Signed)
Obtain CMET today.

## 2014-10-24 NOTE — Assessment & Plan Note (Addendum)
Check CMET today. Could cause leg swelling

## 2014-10-24 NOTE — Assessment & Plan Note (Signed)
POCT urine with culture today. Will call w/ results

## 2014-10-24 NOTE — Assessment & Plan Note (Signed)
Gave pt low FODMAP diet handout, discussed adding a probiotic, seeing GI in July, and drinking more water and less sodas. Will follow.

## 2014-11-11 ENCOUNTER — Other Ambulatory Visit: Payer: Self-pay | Admitting: Internal Medicine

## 2014-11-12 ENCOUNTER — Ambulatory Visit
Admission: RE | Admit: 2014-11-12 | Discharge: 2014-11-12 | Disposition: A | Discharge status: Home / Self Care | Payer: Medicare PPO | Source: Ambulatory Visit | Attending: Oncology | Admitting: Oncology

## 2014-11-12 ENCOUNTER — Ambulatory Visit (INDEPENDENT_AMBULATORY_CARE_PROVIDER_SITE_OTHER): Payer: Medicare PPO | Admitting: Internal Medicine

## 2014-11-12 ENCOUNTER — Other Ambulatory Visit: Payer: Self-pay | Admitting: Oncology

## 2014-11-12 ENCOUNTER — Encounter: Payer: Self-pay | Admitting: Internal Medicine

## 2014-11-12 VITALS — BP 110/70 | HR 69 | Temp 97.8°F | Ht 64.0 in | Wt 201.1 lb

## 2014-11-12 DIAGNOSIS — Z1231 Encounter for screening mammogram for malignant neoplasm of breast: Secondary | ICD-10-CM | POA: Insufficient documentation

## 2014-11-12 DIAGNOSIS — Z0000 Encounter for general adult medical examination without abnormal findings: Secondary | ICD-10-CM

## 2014-11-12 DIAGNOSIS — E7800 Pure hypercholesterolemia, unspecified: Secondary | ICD-10-CM

## 2014-11-12 DIAGNOSIS — R109 Unspecified abdominal pain: Secondary | ICD-10-CM

## 2014-11-12 DIAGNOSIS — R195 Other fecal abnormalities: Secondary | ICD-10-CM

## 2014-11-12 DIAGNOSIS — F419 Anxiety disorder, unspecified: Secondary | ICD-10-CM

## 2014-11-12 DIAGNOSIS — N289 Disorder of kidney and ureter, unspecified: Secondary | ICD-10-CM | POA: Diagnosis not present

## 2014-11-12 DIAGNOSIS — F32A Depression, unspecified: Secondary | ICD-10-CM

## 2014-11-12 DIAGNOSIS — I10 Essential (primary) hypertension: Secondary | ICD-10-CM | POA: Diagnosis not present

## 2014-11-12 DIAGNOSIS — C3490 Malignant neoplasm of unspecified part of unspecified bronchus or lung: Secondary | ICD-10-CM

## 2014-11-12 DIAGNOSIS — J984 Other disorders of lung: Secondary | ICD-10-CM

## 2014-11-12 DIAGNOSIS — J029 Acute pharyngitis, unspecified: Secondary | ICD-10-CM | POA: Insufficient documentation

## 2014-11-12 DIAGNOSIS — K219 Gastro-esophageal reflux disease without esophagitis: Secondary | ICD-10-CM

## 2014-11-12 DIAGNOSIS — F329 Major depressive disorder, single episode, unspecified: Secondary | ICD-10-CM

## 2014-11-12 DIAGNOSIS — C189 Malignant neoplasm of colon, unspecified: Secondary | ICD-10-CM

## 2014-11-12 LAB — HEPATIC FUNCTION PANEL
ALT: 11 U/L (ref 0–35)
AST: 17 U/L (ref 0–37)
Albumin: 3.9 g/dL (ref 3.5–5.2)
Alkaline Phosphatase: 98 U/L (ref 39–117)
BILIRUBIN DIRECT: 0.1 mg/dL (ref 0.0–0.3)
Total Bilirubin: 0.4 mg/dL (ref 0.2–1.2)
Total Protein: 6.9 g/dL (ref 6.0–8.3)

## 2014-11-12 LAB — URINALYSIS, ROUTINE W REFLEX MICROSCOPIC
BILIRUBIN URINE: NEGATIVE
Ketones, ur: NEGATIVE
LEUKOCYTES UA: NEGATIVE
Nitrite: NEGATIVE
PH: 6.5 (ref 5.0–8.0)
Specific Gravity, Urine: 1.01 (ref 1.000–1.030)
Total Protein, Urine: NEGATIVE
URINE GLUCOSE: NEGATIVE
UROBILINOGEN UA: 0.2 (ref 0.0–1.0)

## 2014-11-12 LAB — BASIC METABOLIC PANEL
BUN: 17 mg/dL (ref 6–23)
CALCIUM: 9.4 mg/dL (ref 8.4–10.5)
CO2: 27 mEq/L (ref 19–32)
Chloride: 104 mEq/L (ref 96–112)
Creatinine, Ser: 1.31 mg/dL — ABNORMAL HIGH (ref 0.40–1.20)
GFR: 43.31 mL/min — ABNORMAL LOW (ref >60.00)
GLUCOSE: 98 mg/dL (ref 70–99)
Potassium: 4.1 mEq/L (ref 3.5–5.1)
Sodium: 139 mEq/L (ref 135–145)

## 2014-11-12 LAB — LIPID PANEL
Cholesterol: 220 mg/dL — ABNORMAL HIGH (ref 0–200)
HDL: 49 mg/dL (ref >39.00)
LDL Cholesterol: 136 mg/dL — ABNORMAL HIGH (ref 0–99)
NonHDL: 171
TRIGLYCERIDES: 176 mg/dL — AB (ref 0.0–149.0)
Total CHOL/HDL Ratio: 4
VLDL: 35.2 mg/dL (ref 0.0–40.0)

## 2014-11-12 LAB — TSH: TSH: 2.51 u[IU]/mL (ref 0.35–4.50)

## 2014-11-12 MED ORDER — CHOLESTYRAMINE 4 G PO PACK: 4.0000 g | PACK | Freq: Every day | ORAL | Status: DC

## 2014-11-12 MED ORDER — ALPRAZOLAM 0.5 MG PO TABS: 0.5000 mg | ORAL_TABLET | Freq: Every day | ORAL | Status: DC | PRN

## 2014-11-12 MED ORDER — NYSTATIN 100000 UNIT/ML MT SUSP: OROMUCOSAL | Status: DC

## 2014-11-12 NOTE — Progress Notes (Signed)
Patient ID: Victoria Hanson, female   DOB: [birth year], 64 y.o.   MRN: [id number]   Subjective:    Patient ID: Victoria Hanson, female    DOB: 13-May-1951, 64 y.o.   MRN: [id number]  HPI  Patient here as a work in with concerns regarding sore throat and foot swelling.  She also reports having issues with loose stool.  Has had some issues with loose stool, since her gall bladder surgery.  Intermittently worse at times.  No abdominal pain.  Due to see GI soon.  She was questioning if her foot swelling could be related to bowel issues.  No increased sob.  Takes xanax prn.  Increased stress.  Does not take regularly.  Sore throat x 4 days.  Ears previously hurting.  Able to eat.  No vomiting.  Was concerned if had strep throat.  Wants urine checked.  Concerned about possible infection.    Past Medical History  Diagnosis Date  . Colon cancer 1999    Pt had partial colectomy. Did develop lung metastasis. Had right upper lobectomy in 01/2007 then had associated chemotherapy.  . Hypercholesterolemia   . Hypertension   . Depression   . Nephrolithiasis   . Elevated transaminase level     ? NASH  . GERD (gastroesophageal reflux disease)   . Holter monitor, abnormal 12/2009    NSR, rare PACs and PVCs  . Echocardiogram abnormal 02/2010    EF 55-60%, mild LAE, no regional wall motion abnormalities  . Lung cancer     S/P resection (lobectomy - right)  . COPD (chronic obstructive pulmonary disease)     Current Outpatient Prescriptions on File Prior to Visit  Medication Sig Dispense Refill  . amoxicillin-clavulanate (AUGMENTIN) 875-125 MG per tablet Take 1 tablet by mouth 2 (two) times daily. 10 tablet 0  . aspirin 81 MG tablet Take 81 mg by mouth daily.    . mometasone (NASONEX) 50 MCG/ACT nasal spray Place 2 sprays into the nose daily. 17 g 6  . Multiple Vitamin (MULTIVITAMIN) tablet Take 1 tablet by mouth daily.    Marland Kitchen omeprazole (PRILOSEC) 20 MG capsule TAKE ONE (1) CAPSULE EACH DAY 30 capsule 6  .  potassium chloride SA (K-DUR,KLOR-CON) 20 MEQ tablet TAKE ONE TABLET TWICE DAILY 60 tablet 6  . PROAIR HFA 108 (90 BASE) MCG/ACT inhaler INHALE TWO PUFFS INTO THE LUNGS EVERY 6 HOURS AS NEEDED FOR WHEEZING 8.5 g 3  . traZODone (DESYREL) 50 MG tablet TAKE TWO AND ONE-HALF TABLETS AT BEDTIME 90 tablet 2  . triamterene-hydrochlorothiazide (MAXZIDE-25) 37.5-25 MG per tablet TAKE ONE (1) TABLET EACH DAY 30 tablet 6   No current facility-administered medications on file prior to visit.    Review of Systems  Constitutional: Negative for appetite change and unexpected weight change.  HENT: Positive for sore throat and trouble swallowing. Negative for congestion and sinus pressure.   Respiratory: Negative for cough, chest tightness and shortness of breath.   Cardiovascular: Negative for chest pain and palpitations.       Described intermittent foot swelling.    Gastrointestinal: Positive for diarrhea. Negative for nausea, vomiting and abdominal pain.  Skin: Negative for color change and rash.  Neurological: Negative for dizziness, light-headedness and headaches.  Psychiatric/Behavioral:       Increased stress       Objective:    Physical Exam  Constitutional: She appears well-developed and well-nourished. No distress.  HENT:  Nose: Nose normal.  Mouth/Throat: Oropharynx is clear and moist. No  oropharyngeal exudate.  Neck: Neck supple. No thyromegaly present.  Cardiovascular: Normal rate and regular rhythm.   Pulmonary/Chest: Breath sounds normal. No respiratory distress. She has no wheezes.  Abdominal: Soft. Bowel sounds are normal. There is no tenderness.  Musculoskeletal: She exhibits no edema or tenderness.  Lymphadenopathy:    She has no cervical adenopathy.  Skin: No rash noted. No erythema.  Psychiatric: She has a normal mood and affect. Her behavior is normal.    BP 110/70 mmHg  Pulse 69  Temp(Src) 97.8 F (36.6 C) (Oral)  Ht '5\' 4"'$  (1.626 m)  Wt 201 lb 2 oz (91.23 kg)  BMI  34.51 kg/m2  SpO2 95% Wt Readings from Last 3 Encounters:  11/12/14 201 lb 2 oz (91.23 kg)  10/18/14 200 lb 1.9 oz (90.774 kg)  07/14/14 198 lb (89.812 kg)     Lab Results  Component Value Date   WBC 8.0 09/01/2014   HGB 12.1 09/01/2014   HCT 36.4 09/01/2014   PLT 232 09/01/2014   GLUCOSE 98 11/12/2014   CHOL 220* 11/12/2014   TRIG 176.0* 11/12/2014   HDL 49.00 11/12/2014   LDLDIRECT 163.4 12/31/2012   LDLCALC 136* 11/12/2014   ALT 11 11/12/2014   AST 17 11/12/2014   NA 139 11/12/2014   K 4.1 11/12/2014   CL 104 11/12/2014   CREATININE 1.31* 11/12/2014   BUN 17 11/12/2014   CO2 27 11/12/2014   TSH 2.51 11/12/2014   INR 1.0 12/12/2012   HGBA1C 6.0 10/18/2014       Assessment & Plan:   Problem List Items Addressed This Visit    Anxiety, mild    Xanax as needed.  Rarely takes.  Refilled today.        Relevant Medications   ALPRAZolam (XANAX) 0.5 MG tablet   Colon cancer    Colonoscopy 07/08/13 - internal hemorrhoids.  Recommended f/u colonoscopy in 3 years.  Has a f/u appt soon with GI.  Persistent loose stool.  Probiotic as directed.  Restart cholestyramine.        Relevant Medications   ALPRAZolam (XANAX) 0.5 MG tablet   Depression    Follow.  Takes xanax prn.  Follow.       Relevant Medications   ALPRAZolam (XANAX) 0.5 MG tablet   GERD (gastroesophageal reflux disease)    Symptoms controlled on omeprazole.        Health care maintenance    Physical 01/07/14.  Mammogram 11/10/13 - Birads II.  Schedule f/u mammogram.  Colonoscopy 07/08/13 - recommended f/u in 3 years.        Hypercholesterolemia    Low cholesterol diet and exercise.  Restart cholestyramine.  Follow lipid panel.        Relevant Medications   cholestyramine (QUESTRAN) 4 G packet   Other Relevant Orders   Lipid panel (Completed)   Hepatic function panel (Completed)   Hypertension - Primary    Blood pressure doing well.  Will continue same medication regimen.  Have tried various blood  pressure medications.  She tolerates this medication.  Follow.  Check metabolic panel.       Relevant Medications   cholestyramine (QUESTRAN) 4 G packet   Other Relevant Orders   TSH (Completed)   Basic metabolic panel (Completed)   Loose stools    Start probiotic.  Restart cholestyramine.  Keep GI appt.        Lung cancer    S/p lobectomy.  Sees Dr Oliva Bustard.  Breathing stable.  Continue to  f/u with Dr Oliva Bustard.        Relevant Medications   ALPRAZolam (XANAX) 0.5 MG tablet   Pharyngitis    Nystatin swish and spit.   Hold abx.  Check throat culture.       Relevant Orders   Culture, Group A Strep   Renal insufficiency    Stay hydrated.  Recheck metabolic panel.  Renal ultrasound 03/28/11 - no significant abnormalities.        Relevant Orders   Basic metabolic panel (Completed)    Other Visit Diagnoses    Abdominal pain, unspecified abdominal location        Relevant Orders    Urinalysis, Routine w reflex microscopic (not at [facility]) (Completed)    CULTURE, URINE COMPREHENSIVE (Completed)      I spent 25 minutes with the patient and more than 50% of the time was spent in consultation regarding the above.     Einar Pheasant, MD

## 2014-11-12 NOTE — Progress Notes (Signed)
Pre visit review using our clinic review tool, if applicable. No additional management support is needed unless otherwise documented below in the visit note. 

## 2014-11-12 NOTE — Patient Instructions (Signed)
Take align - one per day

## 2014-11-14 ENCOUNTER — Encounter: Payer: Self-pay | Admitting: Internal Medicine

## 2014-11-14 DIAGNOSIS — R195 Other fecal abnormalities: Secondary | ICD-10-CM | POA: Insufficient documentation

## 2014-11-14 LAB — CULTURE, GROUP A STREP: ORGANISM ID, BACTERIA: NORMAL

## 2014-11-14 NOTE — Assessment & Plan Note (Signed)
Colonoscopy 07/08/13 - internal hemorrhoids.  Recommended f/u colonoscopy in 3 years.  Has a f/u appt soon with GI.  Persistent loose stool.  Probiotic as directed.  Restart cholestyramine.

## 2014-11-14 NOTE — Assessment & Plan Note (Signed)
Symptoms controlled on omeprazole.   

## 2014-11-14 NOTE — Assessment & Plan Note (Signed)
Nystatin swish and spit.   Hold abx.  Check throat culture.

## 2014-11-14 NOTE — Assessment & Plan Note (Signed)
Start probiotic.  Restart cholestyramine.  Keep GI appt.

## 2014-11-14 NOTE — Assessment & Plan Note (Signed)
Physical 01/07/14.  Mammogram 11/10/13 - Birads II.  Schedule f/u mammogram.  Colonoscopy 07/08/13 - recommended f/u in 3 years.

## 2014-11-14 NOTE — Assessment & Plan Note (Signed)
Follow.  Takes xanax prn.  Follow.

## 2014-11-14 NOTE — Assessment & Plan Note (Signed)
Stay hydrated.  Recheck metabolic panel.  Renal ultrasound 03/28/11 - no significant abnormalities.

## 2014-11-14 NOTE — Assessment & Plan Note (Signed)
Xanax as needed.  Rarely takes.  Refilled today.

## 2014-11-14 NOTE — Assessment & Plan Note (Signed)
Low cholesterol diet and exercise.  Restart cholestyramine.  Follow lipid panel.

## 2014-11-14 NOTE — Assessment & Plan Note (Signed)
Blood pressure doing well.  Will continue same medication regimen.  Have tried various blood pressure medications.  She tolerates this medication.  Follow.  Check metabolic panel.

## 2014-11-14 NOTE — Assessment & Plan Note (Signed)
S/p lobectomy.  Sees Dr Choksi.  Breathing stable.  Continue to f/u with Dr Choksi.   

## 2014-11-16 ENCOUNTER — Other Ambulatory Visit: Payer: Self-pay | Admitting: Oncology

## 2014-11-16 ENCOUNTER — Other Ambulatory Visit: Payer: Self-pay | Admitting: *Deleted

## 2014-11-16 ENCOUNTER — Telehealth: Payer: Self-pay | Admitting: *Deleted

## 2014-11-16 DIAGNOSIS — N6489 Other specified disorders of breast: Secondary | ICD-10-CM

## 2014-11-16 DIAGNOSIS — R928 Other abnormal and inconclusive findings on diagnostic imaging of breast: Secondary | ICD-10-CM

## 2014-11-16 LAB — CULTURE, URINE COMPREHENSIVE

## 2014-11-16 MED ORDER — AMOXICILLIN 875 MG PO TABS: 875.0000 mg | ORAL_TABLET | Freq: Two times a day (BID) | ORAL | Status: DC

## 2014-11-16 NOTE — Telephone Encounter (Signed)
See me end of month per Dr Oliva Bustard, Pt notified and order enter to schedule her

## 2014-11-16 NOTE — Telephone Encounter (Signed)
Abx sent to pharmacy for abnormal Urine Culture

## 2014-11-17 ENCOUNTER — Ambulatory Visit
Admission: RE | Admit: 2014-11-17 | Discharge: 2014-11-17 | Disposition: A | Discharge status: Home / Self Care | Payer: Medicare PPO | Source: Ambulatory Visit | Attending: Oncology | Admitting: Oncology

## 2014-11-17 DIAGNOSIS — R922 Inconclusive mammogram: Secondary | ICD-10-CM | POA: Diagnosis present

## 2014-11-17 DIAGNOSIS — R928 Other abnormal and inconclusive findings on diagnostic imaging of breast: Secondary | ICD-10-CM

## 2014-11-17 DIAGNOSIS — N6489 Other specified disorders of breast: Secondary | ICD-10-CM

## 2014-11-19 ENCOUNTER — Encounter: Payer: Self-pay | Admitting: Internal Medicine

## 2014-11-19 ENCOUNTER — Ambulatory Visit (INDEPENDENT_AMBULATORY_CARE_PROVIDER_SITE_OTHER): Payer: Medicare PPO | Admitting: Internal Medicine

## 2014-11-19 VITALS — BP 120/78 | HR 65 | Temp 97.5°F | Resp 12 | Ht 64.0 in | Wt 204.4 lb

## 2014-11-19 DIAGNOSIS — M7989 Other specified soft tissue disorders: Secondary | ICD-10-CM

## 2014-11-19 DIAGNOSIS — E7800 Pure hypercholesterolemia, unspecified: Secondary | ICD-10-CM

## 2014-11-19 DIAGNOSIS — C189 Malignant neoplasm of colon, unspecified: Secondary | ICD-10-CM

## 2014-11-19 DIAGNOSIS — C3490 Malignant neoplasm of unspecified part of unspecified bronchus or lung: Secondary | ICD-10-CM | POA: Diagnosis not present

## 2014-11-19 DIAGNOSIS — R195 Other fecal abnormalities: Secondary | ICD-10-CM

## 2014-11-19 DIAGNOSIS — Z0000 Encounter for general adult medical examination without abnormal findings: Secondary | ICD-10-CM

## 2014-11-19 DIAGNOSIS — F329 Major depressive disorder, single episode, unspecified: Secondary | ICD-10-CM

## 2014-11-19 DIAGNOSIS — K219 Gastro-esophageal reflux disease without esophagitis: Secondary | ICD-10-CM | POA: Diagnosis not present

## 2014-11-19 DIAGNOSIS — R300 Dysuria: Secondary | ICD-10-CM

## 2014-11-19 DIAGNOSIS — F419 Anxiety disorder, unspecified: Secondary | ICD-10-CM

## 2014-11-19 DIAGNOSIS — Z85038 Personal history of other malignant neoplasm of large intestine: Secondary | ICD-10-CM | POA: Insufficient documentation

## 2014-11-19 DIAGNOSIS — I10 Essential (primary) hypertension: Secondary | ICD-10-CM | POA: Diagnosis not present

## 2014-11-19 DIAGNOSIS — N289 Disorder of kidney and ureter, unspecified: Secondary | ICD-10-CM

## 2014-11-19 DIAGNOSIS — F32A Depression, unspecified: Secondary | ICD-10-CM

## 2014-11-19 NOTE — Progress Notes (Signed)
Pre-visit discussion using our clinic review tool. No additional management support is needed unless otherwise documented below in the visit note.  

## 2014-11-19 NOTE — Progress Notes (Signed)
Patient ID: Victoria Hanson, female   DOB: [birth year], 64 y.o.   MRN: [id number]   Subjective:    Patient ID: Victoria Hanson, female    DOB: [birth year], 64 y.o.   MRN: [id number]  HPI  Patient here for a scheduled follow up.  Was just evaluated.  See last note for details.  Has started cholestyramine.  Diarrhea is better.  More formed stool.  Saw GI.  They are wanting stool studies.  She is unable to collect secondary to stool being more formed.  Was treated with amoxicillin for uti.  Symptoms better.  Recent labs - cholesterol increased.  Unable to take statin medications.  Back on cholestyramine.  Recent mammogram ok.  Takes xanax prn for increased stress.  Her former son-n-law has metastatic skin cancer. We discussed her recent labs, including her kidney function results.  Discussed referral to nephrology.  She declines at this time.     Past Medical History  Diagnosis Date  . Colon cancer 1999    Pt had partial colectomy. Did develop lung metastasis. Had right upper lobectomy in 01/2007 then had associated chemotherapy.  . Hypercholesterolemia   . Hypertension   . Depression   . Nephrolithiasis   . Elevated transaminase level     ? NASH  . GERD (gastroesophageal reflux disease)   . Holter monitor, abnormal 12/2009    NSR, rare PACs and PVCs  . Echocardiogram abnormal 02/2010    EF 55-60%, mild LAE, no regional wall motion abnormalities  . Lung cancer     S/P resection (lobectomy - right)  . COPD (chronic obstructive pulmonary disease)     Current Outpatient Prescriptions on File Prior to Visit  Medication Sig Dispense Refill  . ALPRAZolam (XANAX) 0.5 MG tablet Take 1 tablet (0.5 mg total) by mouth daily as needed for anxiety. 60 tablet 0  . amoxicillin (AMOXIL) 875 MG tablet Take 1 tablet (875 mg total) by mouth 2 (two) times daily. 14 tablet 0  . aspirin 81 MG tablet Take 81 mg by mouth daily.    . cholestyramine (QUESTRAN) 4 G packet Take 1 packet (4 g total) by mouth daily.  60 each 1  . mometasone (NASONEX) 50 MCG/ACT nasal spray Place 2 sprays into the nose daily. 17 g 6  . Multiple Vitamin (MULTIVITAMIN) tablet Take 1 tablet by mouth daily.    Marland Kitchen nystatin (MYCOSTATIN) 100000 UNIT/ML suspension 5 mls swish and spit tid 60 mL 0  . omeprazole (PRILOSEC) 20 MG capsule TAKE ONE (1) CAPSULE EACH DAY 30 capsule 6  . potassium chloride SA (K-DUR,KLOR-CON) 20 MEQ tablet TAKE ONE TABLET TWICE DAILY 60 tablet 6  . PROAIR HFA 108 (90 BASE) MCG/ACT inhaler INHALE TWO PUFFS INTO THE LUNGS EVERY 6 HOURS AS NEEDED FOR WHEEZING 8.5 g 3  . traZODone (DESYREL) 50 MG tablet TAKE TWO AND ONE-HALF TABLETS AT BEDTIME 90 tablet 2  . triamterene-hydrochlorothiazide (MAXZIDE-25) 37.5-25 MG per tablet TAKE ONE (1) TABLET EACH DAY 30 tablet 6  . amoxicillin-clavulanate (AUGMENTIN) 875-125 MG per tablet Take 1 tablet by mouth 2 (two) times daily. (Patient not taking: Reported on 11/19/2014) 10 tablet 0   No current facility-administered medications on file prior to visit.    Review of Systems  Constitutional: Negative for appetite change and unexpected weight change.  HENT: Negative for congestion and sinus pressure.   Respiratory: Negative for cough, chest tightness and shortness of breath.   Cardiovascular: Positive for leg swelling (reports some intermittent leg swelling.  ).  Negative for chest pain and palpitations.  Gastrointestinal: Negative for nausea, vomiting and abdominal pain.       More formed stool.    Genitourinary: Negative for dysuria and difficulty urinating.  Skin: Negative for color change and rash.  Neurological: Negative for dizziness, light-headedness and headaches.  Hematological: Negative for adenopathy. Does not bruise/bleed easily.  Psychiatric/Behavioral: Negative for dysphoric mood and agitation.       Objective:    Physical Exam  Constitutional: She appears well-developed and well-nourished. No distress.  HENT:  Nose: Nose normal.  Mouth/Throat:  Oropharynx is clear and moist.  Neck: Neck supple. No thyromegaly present.  Cardiovascular: Normal rate and regular rhythm.   Pulmonary/Chest: Breath sounds normal. No respiratory distress. She has no wheezes.  Abdominal: Soft. Bowel sounds are normal. There is no tenderness.  Musculoskeletal: She exhibits no edema or tenderness.  Lymphadenopathy:    She has no cervical adenopathy.  Skin: No rash noted. No erythema.  Psychiatric: She has a normal mood and affect. Her behavior is normal.    BP 120/78 mmHg  Pulse 65  Temp(Src) 97.5 F (36.4 C) (Oral)  Resp 12  Ht '5\' 4"'$  (1.626 m)  Wt 204 lb 6 oz (92.704 kg)  BMI 35.06 kg/m2  SpO2 99% Wt Readings from Last 3 Encounters:  11/19/14 204 lb 6 oz (92.704 kg)  11/12/14 201 lb 2 oz (91.23 kg)  10/18/14 200 lb 1.9 oz (90.774 kg)     Lab Results  Component Value Date   WBC 8.0 09/01/2014   HGB 12.1 09/01/2014   HCT 36.4 09/01/2014   PLT 232 09/01/2014   GLUCOSE 98 11/12/2014   CHOL 220* 11/12/2014   TRIG 176.0* 11/12/2014   HDL 49.00 11/12/2014   LDLDIRECT 163.4 12/31/2012   LDLCALC 136* 11/12/2014   ALT 11 11/12/2014   AST 17 11/12/2014   NA 139 11/12/2014   K 4.1 11/12/2014   CL 104 11/12/2014   CREATININE 1.31* 11/12/2014   BUN 17 11/12/2014   CO2 27 11/12/2014   TSH 2.51 11/12/2014   INR 1.0 12/12/2012   HGBA1C 6.0 10/18/2014    US Breast Spencer Axilla  11/17/2014   CLINICAL DATA:  Patient returns after screening study for evaluation of the right breast.  EXAM: DIGITAL DIAGNOSTIC RIGHT MAMMOGRAM WITH 3D TOMOSYNTHESIS WITH CAD  ULTRASOUND RIGHT BREAST  COMPARISON:  11/12/2014 and earlier  ACR Breast Density Category b: There are scattered areas of fibroglandular density.  FINDINGS: Additional views are performed, confirming presence of a 4 mm circumscribed low density nodule with lucent center in the lateral posterior portion of the right breast.  Mammographic images were processed with CAD.  On physical exam, I  palpate no abnormality in the lateral aspect of the right breast.  Targeted ultrasound is performed, showing a small hypoechoic nodule with hyperechoic center in the 930 o'clock location of the right breast 12 cm from the nipple which measures 0.4 x 0.3 cm. Findings are consistent with benign intramammary lymph node.  IMPRESSION: Persistent abnormality represents a benign intramammary lymph node by ultrasound a mammogram. No mammographic or ultrasound evidence for malignancy.  RECOMMENDATION: Screening mammogram in one year.(Code:SM-B-01Y)  I have discussed the findings and recommendations with the patient. Results were also provided in writing at the conclusion of the visit. If applicable, a reminder letter will be sent to the patient regarding the next appointment.  BI-RADS CATEGORY  2: Benign.   Electronically Signed   By: Nolon Nations  M.D.   On: 11/17/2014 13:30   Mm Diag Breast Tomo Uni Right  11/17/2014   CLINICAL DATA:  Patient returns after screening study for evaluation of the right breast.  EXAM: DIGITAL DIAGNOSTIC RIGHT MAMMOGRAM WITH 3D TOMOSYNTHESIS WITH CAD  ULTRASOUND RIGHT BREAST  COMPARISON:  11/12/2014 and earlier  ACR Breast Density Category b: There are scattered areas of fibroglandular density.  FINDINGS: Additional views are performed, confirming presence of a 4 mm circumscribed low density nodule with lucent center in the lateral posterior portion of the right breast.  Mammographic images were processed with CAD.  On physical exam, I palpate no abnormality in the lateral aspect of the right breast.  Targeted ultrasound is performed, showing a small hypoechoic nodule with hyperechoic center in the 930 o'clock location of the right breast 12 cm from the nipple which measures 0.4 x 0.3 cm. Findings are consistent with benign intramammary lymph node.  IMPRESSION: Persistent abnormality represents a benign intramammary lymph node by ultrasound a mammogram. No mammographic or ultrasound  evidence for malignancy.  RECOMMENDATION: Screening mammogram in one year.(Code:SM-B-01Y)  I have discussed the findings and recommendations with the patient. Results were also provided in writing at the conclusion of the visit. If applicable, a reminder letter will be sent to the patient regarding the next appointment.  BI-RADS CATEGORY  2: Benign.   Electronically Signed   By: Nolon Nations M.D.   On: 11/17/2014 13:30       Assessment & Plan:   Problem List Items Addressed This Visit    Anxiety, mild    Uses xanax prn.       Colon cancer    Colonoscopy 05/28/14 - internal hemorrhoids.  Recommended f/u in 3 years.        Depression    Uses xanax prn.       Dysuria    Recently treated with amoxicillin.  Asymptomatic.        GERD (gastroesophageal reflux disease)    Upper symptoms controlled.  Follow.       Health care maintenance    Physical 01/07/14.  Mammogram 11/17/14 - Birads II.  See report as outlined.  Colonoscopy 07/08/13.  Recommended f/u colonoscopy in 3 years.        Hypercholesterolemia    LDL just checked 136.  Slightly increased triglycerides.  Back on cholestyramine.  Follow.       Hypertension - Primary    Blood pressure under good control.  Same medication regimen.  Follow pressures.       Loose stools    Seeing GI.  Back on cholestyramine.  Stools better.       Lung cancer    Followed by Dr Oliva Bustard.       Renal insufficiency    Previous renal ultrasound revealed no significant abnormalities.  Recheck Cr - 1.3.  Discussed referral to nephrology.  She wants to hold at this time.  Follow.       Swelling of lower extremity    No significant swelling noted on exam.  She reports her feet and ankles swell intermittently.  Compression hose.  Stay hydrated.  Follow.         I spent 25 minutes with the patient and more than 50% of the time was spent in consultation regarding the above.     Einar Pheasant, MD

## 2014-11-20 ENCOUNTER — Encounter: Payer: Self-pay | Admitting: Internal Medicine

## 2014-11-20 DIAGNOSIS — M7989 Other specified soft tissue disorders: Secondary | ICD-10-CM | POA: Insufficient documentation

## 2014-11-20 NOTE — Assessment & Plan Note (Signed)
Blood pressure under good control.  Same medication regimen.  Follow pressures.

## 2014-11-20 NOTE — Assessment & Plan Note (Signed)
Uses xanax prn.

## 2014-11-20 NOTE — Assessment & Plan Note (Signed)
Followed by Dr Oliva Bustard.

## 2014-11-20 NOTE — Assessment & Plan Note (Signed)
Recently treated with amoxicillin.  Asymptomatic.

## 2014-11-20 NOTE — Assessment & Plan Note (Signed)
Upper symptoms controlled.  Follow.

## 2014-11-20 NOTE — Assessment & Plan Note (Signed)
Previous renal ultrasound revealed no significant abnormalities.  Recheck Cr - 1.3.  Discussed referral to nephrology.  She wants to hold at this time.  Follow.

## 2014-11-20 NOTE — Assessment & Plan Note (Signed)
No significant swelling noted on exam.  She reports her feet and ankles swell intermittently.  Compression hose.  Stay hydrated.  Follow.

## 2014-11-20 NOTE — Assessment & Plan Note (Signed)
Seeing GI.  Back on cholestyramine.  Stools better.

## 2014-11-20 NOTE — Assessment & Plan Note (Signed)
Colonoscopy 05/28/14 - internal hemorrhoids.  Recommended f/u in 3 years.

## 2014-11-20 NOTE — Assessment & Plan Note (Signed)
Physical 01/07/14.  Mammogram 11/17/14 - Birads II.  See report as outlined.  Colonoscopy 07/08/13.  Recommended f/u colonoscopy in 3 years.

## 2014-11-20 NOTE — Assessment & Plan Note (Signed)
LDL just checked 136.  Slightly increased triglycerides.  Back on cholestyramine.  Follow.

## 2014-11-22 ENCOUNTER — Other Ambulatory Visit
Admission: RE | Admit: 2014-11-22 | Discharge: 2014-11-22 | Disposition: A | Discharge status: Home / Self Care | Payer: Medicare PPO | Source: Other Acute Inpatient Hospital | Attending: Nurse Practitioner | Admitting: Nurse Practitioner

## 2014-11-22 DIAGNOSIS — R197 Diarrhea, unspecified: Secondary | ICD-10-CM | POA: Insufficient documentation

## 2014-11-22 LAB — C DIFFICILE QUICK SCREEN W PCR REFLEX
C Diff antigen: NEGATIVE
C Diff interpretation: NEGATIVE
C Diff toxin: NEGATIVE

## 2014-11-24 LAB — O&P RESULT

## 2014-11-25 LAB — STOOL CULTURE

## 2014-12-01 IMAGING — CT CT ANGIOGRAPHY NECK
1 of 4 series · 12 of 33 positions shown · non-contrast
Comparison: none

REASON FOR EXAM: sodium bicarb   TIA
COMMENTS:

PROCEDURE:     CT  - CT ANGIOGRAPHY NECK W/CONTRAST  - April 07, 2013 [DATE]
RESULT:     History: TIA.
Comparison Study: No prior.

[Series 5: soft tissue · axial · 0.40mm/px · z∈[-244,-10]mm · 12 of 94 slices shown]
[im 8/94  soft-tissue]
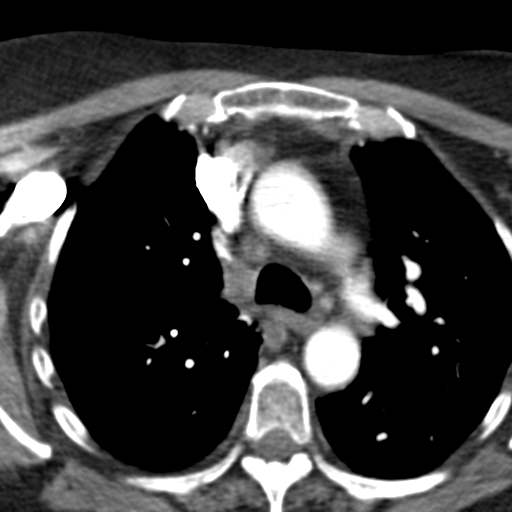
[im 15/94  bone]
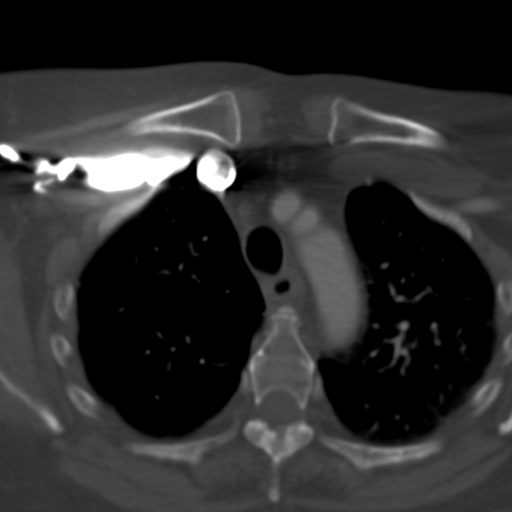
[im 22/94  soft-tissue]
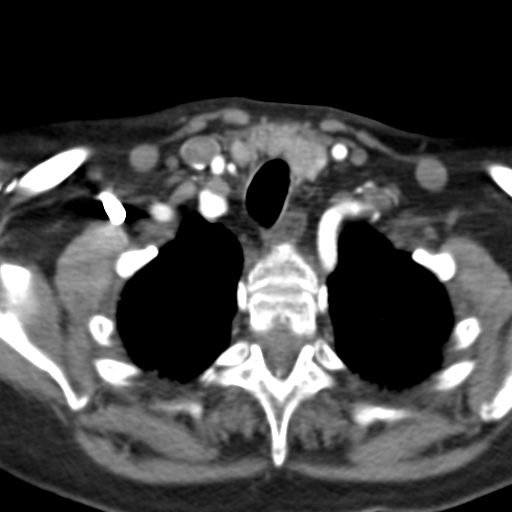
[im 29/94  bone]
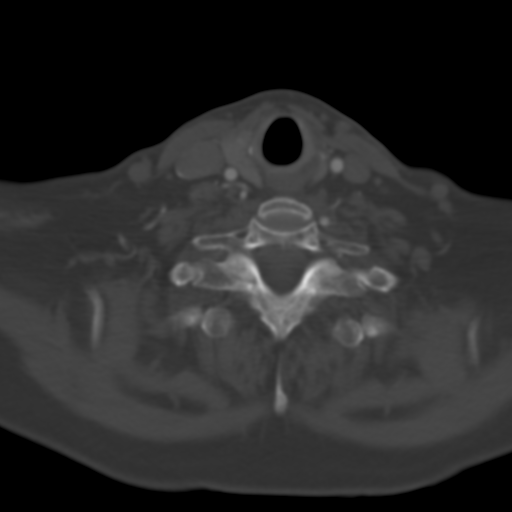
[im 36/94  soft-tissue]
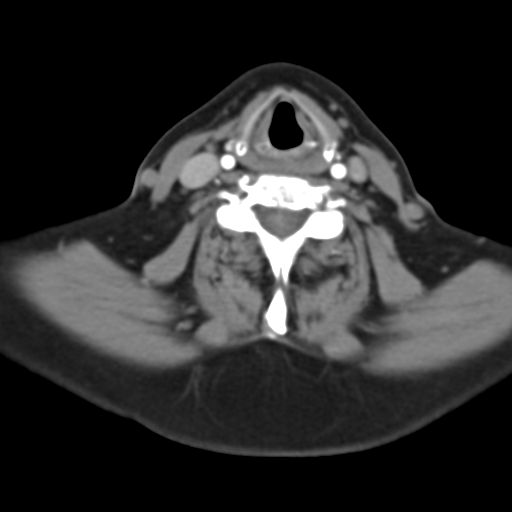
[im 43/94  bone]
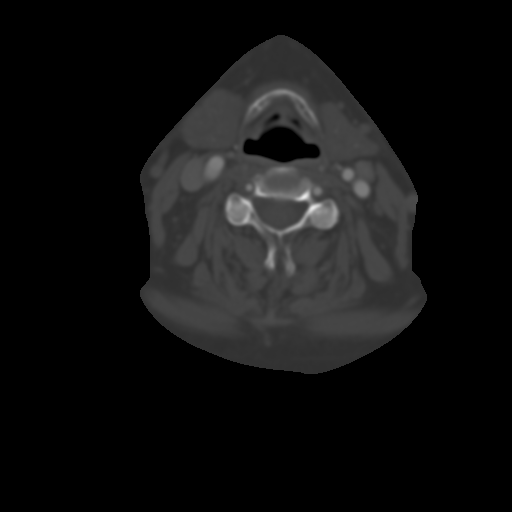
[im 51/94  soft-tissue]
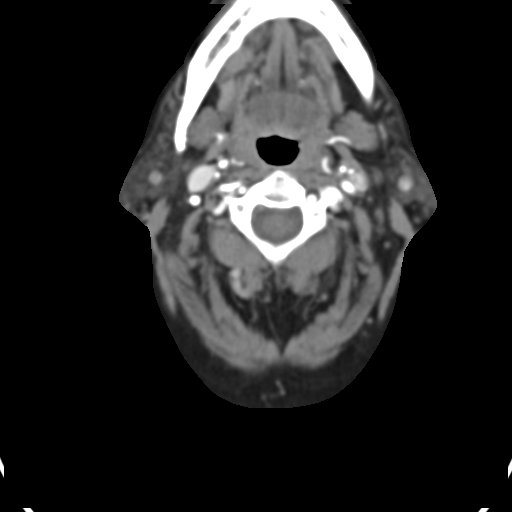
[im 58/94  bone]
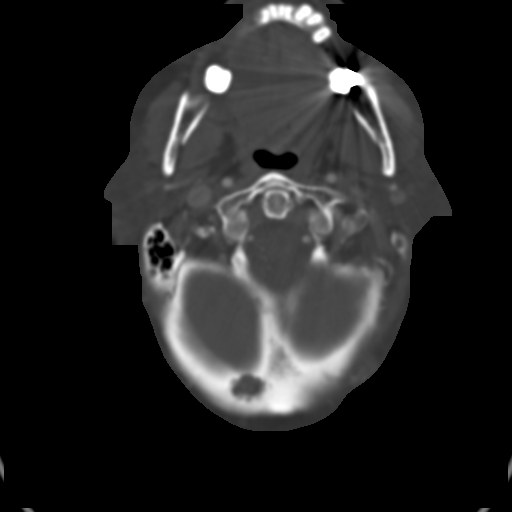
[im 65/94  soft-tissue]
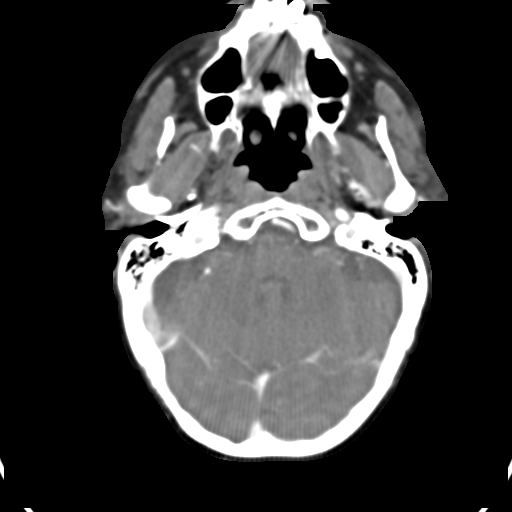
[im 72/94  bone]
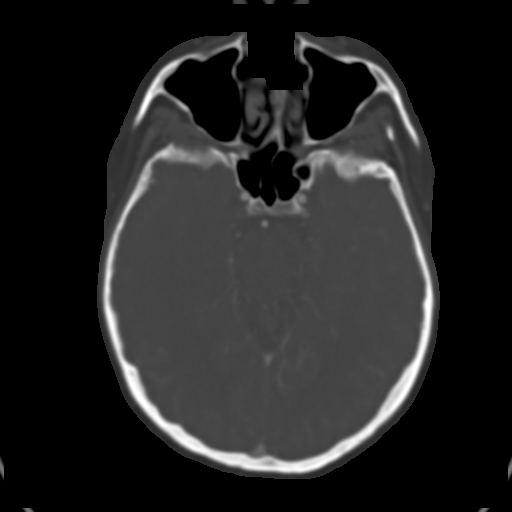
[im 79/94  soft-tissue]
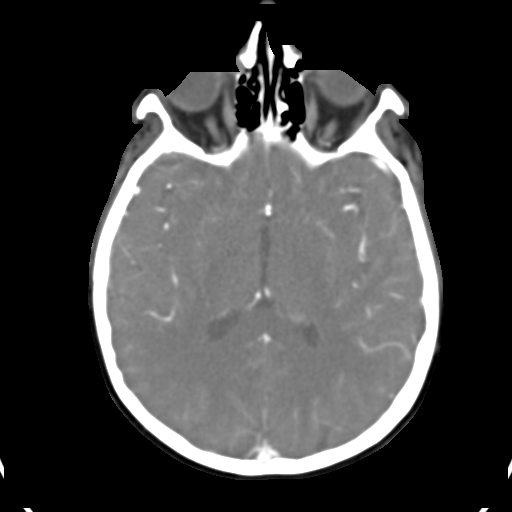
[im 86/94  bone]
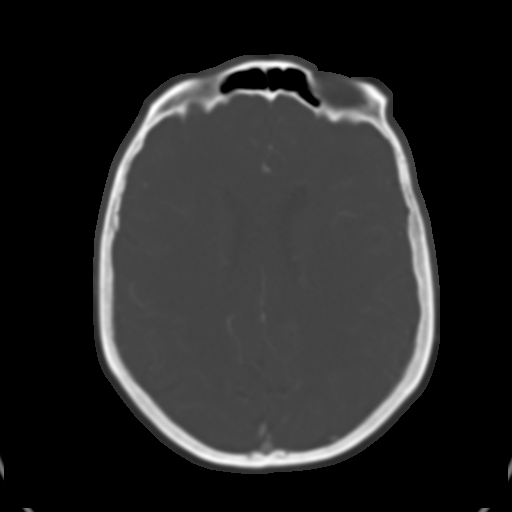

[12 of 33 positions shown; findings below may reference images not displayed]

FINDINGS: Standard CTA performed 100 cc of Lsovue-KUB. Evaluation 3
dimensions performed. Axial and MIP images reviewed No significant carotid
atherosclerotic  vascular disease. The great vessels are patent. Minimal
plaque aortic arch. Aortic arch otherwise normal. Vertebrals are patent.
Anterior and posterior circulation patent. No significant soft tissue or
bony lesions identified. Mild pleural-parenchymal thickening lung apices
consistent with scarring.
IMPRESSION: No significant abnormality.

## 2014-12-08 ENCOUNTER — Ambulatory Visit: Payer: Medicare PPO | Admitting: Oncology

## 2014-12-27 ENCOUNTER — Other Ambulatory Visit: Payer: Self-pay | Admitting: Internal Medicine

## 2014-12-29 ENCOUNTER — Ambulatory Visit: Payer: Medicare PPO | Admitting: Internal Medicine

## 2014-12-29 ENCOUNTER — Telehealth: Payer: Self-pay | Admitting: Internal Medicine

## 2014-12-29 ENCOUNTER — Ambulatory Visit (INDEPENDENT_AMBULATORY_CARE_PROVIDER_SITE_OTHER): Payer: Medicare PPO | Admitting: Family Medicine

## 2014-12-29 ENCOUNTER — Encounter: Payer: Self-pay | Admitting: Family Medicine

## 2014-12-29 VITALS — BP 120/70 | HR 70 | Temp 98.4°F | Ht 64.0 in | Wt 200.0 lb

## 2014-12-29 DIAGNOSIS — S51802A Unspecified open wound of left forearm, initial encounter: Secondary | ICD-10-CM

## 2014-12-29 MED ORDER — AMOXICILLIN-POT CLAVULANATE 875-125 MG PO TABS: 1.0000 | ORAL_TABLET | Freq: Two times a day (BID) | ORAL | Status: DC

## 2014-12-29 NOTE — Telephone Encounter (Signed)
FYI

## 2014-12-29 NOTE — Patient Instructions (Signed)
It was nice to see you today.  Take the antibiotic as prescribed.  Follow up as needed (as indicated prior).  If you worsen, fever/chills/etc. Please let me know.  Dr. Lacinda Axon

## 2014-12-29 NOTE — Assessment & Plan Note (Signed)
No purulent drainage today although yellow discharge is been noted by the patient. There is surrounding warmth and mild erythema concerning for cellulitis. Treating with Augmentin 875/125 mg BID 7 days.

## 2014-12-29 NOTE — Progress Notes (Signed)
   Subjective:    Patient ID: Victoria Hanson, female    DOB: [birth year], 64 y.o.   MRN: [id number]  HPI   64 year old female presents for an acute visit with complaints of a draining wound that she is concerned is infected.  Patient reports that 3-4 days ago she was scratched by her dog. She was scratched on her left lower forearm.  Since that time she has been treating the wound with topical antibiotic ointment and dressing it with Band-Aids.  She has recently noted some yellow discharge and associated redness and warmth.  Additionally, she has suffered some bruising from removing the Band-Aids.  She states it is slowly improving but is not healing as well she thought. No exacerbating factors. No associated fevers, chills, nausea, vomiting.  Social Hx - Nonsmoker.   Review of Systems Per HPI.  All other systems negative.    Objective:   Physical Exam Filed Vitals:   12/29/14 1514  BP: 120/70  Pulse: 70  Temp: 98.4 F (36.9 C)   Vital signs reviewed.  Exam: General: well appearing, NAD. Skin: Linear wound ~3 cm x 5 mm; Located on L lower forearm. Surrounding bruising, warmth and mild erythema. 2 + radial pulse. Sensation intact.  Neuro: No focal deficits. LUE neurovascularly intact.     Assessment & Plan:  See Problem List

## 2014-12-29 NOTE — Telephone Encounter (Signed)
Patient Name: Victoria Hanson  DOB: Nov 06, 1950    Initial Comment Caller states has dog scratch on arm, it will not heal- 4-5 days    Nurse Assessment  Nurse: Mallie Mussel, RN, Alveta Heimlich Date/Time (Eastern Time): 12/29/2014 12:56:33 PM  Confirm and document reason for call. If symptomatic, describe symptoms. ---Caller states that she has dog scratches on her left arm which will not heal. She has to keep it covered up and when she removes the tape, it causes a bruise. She states that it looks like it is getting infected with a yellowish discharge this morning. She has been applying antibiotic cream. Denies pain and fever.  Has the patient traveled out of the country within the last 30 days? ---No  Does the patient require triage? ---Yes  Related visit to physician within the last 2 weeks? ---No  Does the PT have any chronic conditions? (i.e. diabetes, asthma, etc.) ---Yes  List chronic conditions. ---HTN, COPD, Hypercholesterolemia     Guidelines    Guideline Title Affirmed Question Affirmed Notes  Wound Infection [1] Pus or cloudy fluid draining from wound AND [2] no fever    Final Disposition User   See Physician within [place], RN, Alveta Heimlich    Comments  Appointment scheduled for 3:30pm today with Dr. Thersa Salt.   Referrals  REFERRED TO PCP OFFICE   Disagree/Comply: Comply

## 2015-01-05 ENCOUNTER — Ambulatory Visit: Payer: Medicare PPO | Admitting: Cardiovascular Disease

## 2015-01-10 ENCOUNTER — Other Ambulatory Visit: Payer: Self-pay | Admitting: Internal Medicine

## 2015-01-11 ENCOUNTER — Encounter: Payer: Self-pay | Admitting: Internal Medicine

## 2015-01-11 ENCOUNTER — Ambulatory Visit (INDEPENDENT_AMBULATORY_CARE_PROVIDER_SITE_OTHER): Payer: Medicare PPO | Admitting: Internal Medicine

## 2015-01-11 VITALS — BP 120/80 | HR 70 | Temp 98.0°F | Ht 64.0 in | Wt 199.4 lb

## 2015-01-11 DIAGNOSIS — R739 Hyperglycemia, unspecified: Secondary | ICD-10-CM

## 2015-01-11 DIAGNOSIS — Z0000 Encounter for general adult medical examination without abnormal findings: Secondary | ICD-10-CM

## 2015-01-11 DIAGNOSIS — Z23 Encounter for immunization: Secondary | ICD-10-CM

## 2015-01-11 DIAGNOSIS — N289 Disorder of kidney and ureter, unspecified: Secondary | ICD-10-CM

## 2015-01-11 DIAGNOSIS — I10 Essential (primary) hypertension: Secondary | ICD-10-CM | POA: Diagnosis not present

## 2015-01-11 DIAGNOSIS — K219 Gastro-esophageal reflux disease without esophagitis: Secondary | ICD-10-CM

## 2015-01-11 DIAGNOSIS — R300 Dysuria: Secondary | ICD-10-CM

## 2015-01-11 DIAGNOSIS — R195 Other fecal abnormalities: Secondary | ICD-10-CM

## 2015-01-11 DIAGNOSIS — C189 Malignant neoplasm of colon, unspecified: Secondary | ICD-10-CM

## 2015-01-11 DIAGNOSIS — C3490 Malignant neoplasm of unspecified part of unspecified bronchus or lung: Secondary | ICD-10-CM

## 2015-01-11 DIAGNOSIS — E7800 Pure hypercholesterolemia, unspecified: Secondary | ICD-10-CM

## 2015-01-11 DIAGNOSIS — F419 Anxiety disorder, unspecified: Secondary | ICD-10-CM

## 2015-01-11 LAB — URINALYSIS, ROUTINE W REFLEX MICROSCOPIC
Bilirubin Urine: NEGATIVE
Hgb urine dipstick: NEGATIVE
Ketones, ur: NEGATIVE
NITRITE: NEGATIVE
RBC / HPF: NONE SEEN (ref 0–?)
SPECIFIC GRAVITY, URINE: 1.01 (ref 1.000–1.030)
Total Protein, Urine: NEGATIVE
URINE GLUCOSE: NEGATIVE
Urobilinogen, UA: 0.2 (ref 0.0–1.0)
pH: 7.5 (ref 5.0–8.0)

## 2015-01-11 NOTE — Progress Notes (Signed)
Patient ID: Victoria Hanson, female   DOB: 06/05/1951, 64 y.o.   MRN: [id number]   Subjective:    Patient ID: Victoria Hanson, female    DOB: 07-18-1950, 64 y.o.   MRN: [id number]  HPI  Patient here to follow up on her current medical issues and for a complete physical exam.  She was unable to tolerate the cholestyramine.  She is still having diarrhea.  Takes antidiarrheal medication for this.  Saw GI.  Stool test negative.  Has not turned in the hemoccult cards.  Eating and drinking well.  No nausea or vomiting.  No cardiac symptoms with increased activity or exertion.  No sob.  No cough or congestion.  Discussed the need to stay hydrated.    Past Medical History  Diagnosis Date  . Colon cancer 1999    Pt had partial colectomy. Did develop lung metastasis. Had right upper lobectomy in 01/2007 then had associated chemotherapy.  . Hypercholesterolemia   . Hypertension   . Depression   . Nephrolithiasis   . Elevated transaminase level     ? NASH  . GERD (gastroesophageal reflux disease)   . Holter monitor, abnormal 12/2009    NSR, rare PACs and PVCs  . Echocardiogram abnormal 02/2010    EF 55-60%, mild LAE, no regional wall motion abnormalities  . Lung cancer     S/P resection (lobectomy - right)  . COPD (chronic obstructive pulmonary disease)     Outpatient Encounter Prescriptions as of 01/11/2015  Medication Sig  . ALPRAZolam (XANAX) 0.5 MG tablet Take 1 tablet (0.5 mg total) by mouth daily as needed for anxiety.  Marland Kitchen aspirin 81 MG tablet Take 81 mg by mouth daily.  . mometasone (NASONEX) 50 MCG/ACT nasal spray Place 2 sprays into the nose daily.  . Multiple Vitamin (MULTIVITAMIN) tablet Take 1 tablet by mouth daily.  Marland Kitchen nystatin (MYCOSTATIN) 100000 UNIT/ML suspension 5 mls swish and spit tid  . omeprazole (PRILOSEC) 20 MG capsule TAKE ONE (1) CAPSULE EACH DAY  . potassium chloride SA (K-DUR,KLOR-CON) 20 MEQ tablet TAKE ONE TABLET TWICE DAILY  . PROAIR HFA 108 (90 BASE) MCG/ACT  inhaler INHALE TWO PUFFS INTO THE LUNGS EVERY 6 HOURS AS NEEDED FOR WHEEZING  . traZODone (DESYREL) 50 MG tablet TAKE TWO AND ONE-HALF TABLETS AT BEDTIME  . triamterene-hydrochlorothiazide (MAXZIDE-25) 37.5-25 MG per tablet TAKE ONE (1) TABLET EACH DAY  . [DISCONTINUED] amoxicillin-clavulanate (AUGMENTIN) 875-125 MG per tablet Take 1 tablet by mouth 2 (two) times daily.   No facility-administered encounter medications on file as of 01/11/2015.    Review of Systems  Constitutional: Negative for appetite change and unexpected weight change.  HENT: Negative for congestion and sinus pressure.   Eyes: Negative for pain and visual disturbance.  Respiratory: Negative for cough, chest tightness and shortness of breath.   Cardiovascular: Negative for chest pain and palpitations.  Gastrointestinal: Positive for diarrhea (persistent.  could not tolerate the cholestyramine.  ). Negative for nausea, vomiting and abdominal pain.  Genitourinary: Negative for dysuria and difficulty urinating.  Musculoskeletal: Negative for back pain and joint swelling.  Skin: Negative for color change and rash.  Neurological: Negative for dizziness, light-headedness and headaches.  Hematological: Negative for adenopathy. Does not bruise/bleed easily.  Psychiatric/Behavioral: Negative for dysphoric mood and agitation.       Objective:     Blood pressure recheck:  122/78  Physical Exam  Constitutional: She is oriented to person, place, and time. She appears well-developed and well-nourished.  HENT:  Nose: Nose normal.  Mouth/Throat: Oropharynx is clear and moist.  Eyes: Right eye exhibits no discharge. Left eye exhibits no discharge. No scleral icterus.  Neck: Neck supple. No thyromegaly present.  Cardiovascular: Normal rate and regular rhythm.   Pulmonary/Chest: Breath sounds normal. No accessory muscle usage. No tachypnea. No respiratory distress. She has no decreased breath sounds. She has no wheezes. She has no  rhonchi. Right breast exhibits no inverted nipple, no mass, no nipple discharge and no tenderness (no axillary adenopathy). Left breast exhibits no inverted nipple, no mass, no nipple discharge and no tenderness (no axilarry adenopathy).  Abdominal: Soft. Bowel sounds are normal. There is no tenderness.  Musculoskeletal: She exhibits no edema or tenderness.  Lymphadenopathy:    She has no cervical adenopathy.  Neurological: She is alert and oriented to person, place, and time.  Skin: Skin is warm. No rash noted.  Psychiatric: She has a normal mood and affect. Her behavior is normal.    BP 120/80 mmHg  Pulse 70  Temp(Src) 98 F (36.7 C) (Oral)  Ht '5\' 4"'$  (1.626 m)  Wt 199 lb 6 oz (90.436 kg)  BMI 34.21 kg/m2  SpO2 96% Wt Readings from Last 3 Encounters:  01/11/15 199 lb 6 oz (90.436 kg)  12/29/14 200 lb (90.719 kg)  11/19/14 204 lb 6 oz (92.704 kg)     Lab Results  Component Value Date   WBC 8.0 09/01/2014   HGB 12.1 09/01/2014   HCT 36.4 09/01/2014   PLT 232 09/01/2014   GLUCOSE 98 11/12/2014   CHOL 220* 11/12/2014   TRIG 176.0* 11/12/2014   HDL 49.00 11/12/2014   LDLDIRECT 163.4 12/31/2012   LDLCALC 136* 11/12/2014   ALT 11 11/12/2014   AST 17 11/12/2014   NA 139 11/12/2014   K 4.1 11/12/2014   CL 104 11/12/2014   CREATININE 1.31* 11/12/2014   BUN 17 11/12/2014   CO2 27 11/12/2014   TSH 2.51 11/12/2014   INR 1.0 12/12/2012   HGBA1C 6.0 10/18/2014       Assessment & Plan:   Problem List Items Addressed This Visit    Anxiety, mild    Better.  Has not had to take xanax for a while.  Follow.        Colon cancer    Colonoscopy 07/08/13 - internal hemorrhoids.  Recommend f/u colonoscopy in 3 years.       Relevant Orders   Hepatic function panel   Dysuria   Relevant Orders   Urinalysis, Routine w reflex microscopic (not at [facility]) (Completed)   CULTURE, URINE COMPREHENSIVE   GERD (gastroesophageal reflux disease)    Upper symptoms controlled.       Health  care maintenance    Physical today 01/11/15.  Mammogram 11/17/14 - Birads II.  Colonoscopy 07/08/13 - recommended f/u in 3 years.        Hypercholesterolemia    Unable to take statins.  Tried cholestyramine.  Diet and exercise.  Follow lipid panel.        Relevant Orders   Lipid panel   Hypertension    Blood pressure under good control.  Continue same medication regimen.  She has been intolerant to multiple blood pressure medications.  Follow pressures.  Follow metabolic panel.        Relevant Orders   Basic metabolic panel   Loose stools    Colonoscopy as outlined.  Unable to tolerate cholestyramine.  Discussed getting back in with GI.  She wants to hold at  this time.  Stool studies negative.  Follow.        Lung cancer    Followed by Dr Oliva Bustard.        Renal insufficiency    Previous renal ultrasound revealed no significant abnormalities.  Cr has been averaging 1.3.  Have discussed referral to nephrology.  Wants to hold at this time.  Follow.         Other Visit Diagnoses    Need for prophylactic vaccination against Streptococcus pneumoniae (pneumococcus)    -  Primary    Relevant Orders    Pneumococcal conjugate vaccine 13-valent (Completed)    Hyperglycemia        Relevant Orders    Hemoglobin A1c      I spent 25 minutes with the patient and more than 50% of the time was spent in consultation regarding the above.     Einar Pheasant, MD

## 2015-01-11 NOTE — Progress Notes (Signed)
Pre visit review using our clinic review tool, if applicable. No additional management support is needed unless otherwise documented below in the visit note. 

## 2015-01-12 ENCOUNTER — Encounter: Payer: Self-pay | Admitting: Internal Medicine

## 2015-01-12 NOTE — Assessment & Plan Note (Signed)
Better.  Has not had to take xanax for a while.  Follow.

## 2015-01-12 NOTE — Assessment & Plan Note (Signed)
Colonoscopy as outlined.  Unable to tolerate cholestyramine.  Discussed getting back in with GI.  She wants to hold at this time.  Stool studies negative.  Follow.

## 2015-01-12 NOTE — Assessment & Plan Note (Signed)
Upper symptoms controlled.  

## 2015-01-12 NOTE — Assessment & Plan Note (Signed)
Previous renal ultrasound revealed no significant abnormalities.  Cr has been averaging 1.3.  Have discussed referral to nephrology.  Wants to hold at this time.  Follow.

## 2015-01-12 NOTE — Assessment & Plan Note (Signed)
Blood pressure under good control.  Continue same medication regimen.  She has been intolerant to multiple blood pressure medications.  Follow pressures.  Follow metabolic panel.

## 2015-01-12 NOTE — Assessment & Plan Note (Signed)
Unable to take statins.  Tried cholestyramine.  Diet and exercise.  Follow lipid panel.

## 2015-01-12 NOTE — Assessment & Plan Note (Signed)
Physical today 01/11/15.  Mammogram 11/17/14 - Birads II.  Colonoscopy 07/08/13 - recommended f/u in 3 years.

## 2015-01-12 NOTE — Assessment & Plan Note (Signed)
Colonoscopy 07/08/13 - internal hemorrhoids.  Recommend f/u colonoscopy in 3 years.

## 2015-01-12 NOTE — Assessment & Plan Note (Signed)
Followed by Dr Oliva Bustard.

## 2015-01-14 LAB — CULTURE, URINE COMPREHENSIVE

## 2015-01-21 ENCOUNTER — Other Ambulatory Visit: Payer: Self-pay

## 2015-01-21 MED ORDER — AMOXICILLIN-POT CLAVULANATE 500-125 MG PO TABS: 1.0000 | ORAL_TABLET | Freq: Two times a day (BID) | ORAL | Status: DC

## 2015-01-28 LAB — OVA + PARASITE EXAM

## 2015-02-03 ENCOUNTER — Ambulatory Visit (INDEPENDENT_AMBULATORY_CARE_PROVIDER_SITE_OTHER): Payer: Medicare PPO | Admitting: Nurse Practitioner

## 2015-02-03 ENCOUNTER — Encounter: Payer: Self-pay | Admitting: Nurse Practitioner

## 2015-02-03 ENCOUNTER — Ambulatory Visit
Admission: RE | Admit: 2015-02-03 | Discharge: 2015-02-03 | Disposition: A | Discharge status: Home / Self Care | Payer: Medicare PPO | Source: Ambulatory Visit | Attending: Nurse Practitioner | Admitting: Nurse Practitioner

## 2015-02-03 VITALS — BP 130/82 | HR 69 | Temp 97.7°F | Resp 14 | Ht 64.0 in | Wt 200.6 lb

## 2015-02-03 DIAGNOSIS — R300 Dysuria: Secondary | ICD-10-CM | POA: Diagnosis not present

## 2015-02-03 DIAGNOSIS — R197 Diarrhea, unspecified: Secondary | ICD-10-CM | POA: Diagnosis not present

## 2015-02-03 DIAGNOSIS — R1031 Right lower quadrant pain: Secondary | ICD-10-CM

## 2015-02-03 LAB — POCT URINALYSIS DIPSTICK
Bilirubin, UA: NEGATIVE
Blood, UA: NEGATIVE
GLUCOSE UA: NEGATIVE
Ketones, UA: NEGATIVE
NITRITE UA: NEGATIVE
Protein, UA: NEGATIVE
Spec Grav, UA: 1.01
UROBILINOGEN UA: 0.2
pH, UA: 7

## 2015-02-03 NOTE — Patient Instructions (Signed)
Our referral coordinator will get you scheduled for the abdominal ultrasound.

## 2015-02-03 NOTE — Assessment & Plan Note (Signed)
Not overwhelmingly concerned about appendicitis. Due to generalized tenderness around peri-umbilical area over to the right and generalized on the right. Pt "reports it feels deep inside, but I can't feel it with my hand". Will investigate with [place] of general abdomen.

## 2015-02-03 NOTE — Assessment & Plan Note (Signed)
Helpful when taking OTC anti-diarrheals.

## 2015-02-03 NOTE — Assessment & Plan Note (Signed)
Obtain another POCT urine. Negative except for 1+ leukocytes. Will obtain urine culture.

## 2015-02-03 NOTE — Progress Notes (Signed)
Pre visit review using our clinic review tool, if applicable. No additional management support is needed unless otherwise documented below in the visit note. 

## 2015-02-03 NOTE — Progress Notes (Signed)
Patient ID: Victoria Hanson, female    DOB: [birth year]  Age: 64 y.o. MRN: [id number]  CC: Flank Pain   HPI Victoria Hanson presents for RLQ pain x 3 weeks.   1) 3 weeks of RLQ pain, took Augmentin for 5 days this month related to UTI.  Colon surgery for colon cancer in past.   Feels deep on right side and denies radiation of pain  Onset- 3 weeks ago  Location- RLQ Duration - "Constant", feels better after BM  Characteristics- Sharp, deep  Aggravating factors- Walking  Relieving factors- sitting  Severity- Worst 5-6/10   Anti-diarrhea- OTC helpful   Bowel movement- Tuesday last BM, diarrhea- loose   2) Burning on urination still after treatment completed.   History Victoria Hanson has a past medical history of Colon cancer (1999); Hypercholesterolemia; Hypertension; Depression; Nephrolithiasis; Elevated transaminase level; GERD (gastroesophageal reflux disease); Holter monitor, abnormal (12/2009); Echocardiogram abnormal (02/2010); Lung cancer; and COPD (chronic obstructive pulmonary disease).   She has past surgical history that includes Colon surgery; Lobectomy; Cardiac catheterization (2003); Cholecystectomy (2000); Tubal ligation (1980); Total abdominal hysterectomy; and Breast biopsy (10/07/2009).   Her family history includes Cancer in her father; Coronary artery disease in her mother; Diabetes in her mother; Heart attack (age of onset: 55) in her father; Hypertension in her father; Mental illness in her brother and mother; Stroke in her father; Sudden death in her brother.She reports that she has never smoked. She has never used smokeless tobacco. She reports that she does not drink alcohol or use illicit drugs.  Outpatient Prescriptions Prior to Visit  Medication Sig Dispense Refill  . ALPRAZolam (XANAX) 0.5 MG tablet Take 1 tablet (0.5 mg total) by mouth daily as needed for anxiety. 60 tablet 0  . amoxicillin-clavulanate (AUGMENTIN) 500-125 MG per tablet Take 1 tablet (500 mg  total) by mouth 2 (two) times daily. Complete entire course 10 tablet 0  . aspirin 81 MG tablet Take 81 mg by mouth daily.    . mometasone (NASONEX) 50 MCG/ACT nasal spray Place 2 sprays into the nose daily. 17 g 6  . Multiple Vitamin (MULTIVITAMIN) tablet Take 1 tablet by mouth daily.    Marland Kitchen nystatin (MYCOSTATIN) 100000 UNIT/ML suspension 5 mls swish and spit tid 60 mL 0  . omeprazole (PRILOSEC) 20 MG capsule TAKE ONE (1) CAPSULE EACH DAY 30 capsule 6  . potassium chloride SA (K-DUR,KLOR-CON) 20 MEQ tablet TAKE ONE TABLET TWICE DAILY 60 tablet 6  . PROAIR HFA 108 (90 BASE) MCG/ACT inhaler INHALE TWO PUFFS INTO THE LUNGS EVERY 6 HOURS AS NEEDED FOR WHEEZING 8.5 g 6  . traZODone (DESYREL) 50 MG tablet TAKE TWO AND ONE-HALF TABLETS AT BEDTIME 90 tablet 1  . triamterene-hydrochlorothiazide (MAXZIDE-25) 37.5-25 MG per tablet TAKE ONE (1) TABLET EACH DAY 30 tablet 6   No facility-administered medications prior to visit.    ROS Review of Systems  Constitutional: Negative for fever, chills, diaphoresis and fatigue.  Gastrointestinal: Positive for abdominal pain and diarrhea. Negative for nausea, vomiting, constipation and abdominal distention.  Genitourinary: Positive for dysuria, urgency and frequency. Negative for hematuria, flank pain, decreased urine volume, difficulty urinating and pelvic pain.  Skin: Negative for rash.    Objective:  BP 130/82 mmHg  Pulse 69  Temp(Src) 97.7 F (36.5 C)  Resp 14  Ht '5\' 4"'$  (1.626 m)  Wt 200 lb 9.6 oz (90.992 kg)  BMI 34.42 kg/m2  SpO2 96%  Physical Exam  Constitutional: She is oriented to person, place, and  time. She appears well-developed and well-nourished. No distress.  HENT:  Head: Normocephalic and atraumatic.  Right Ear: External ear normal.  Left Ear: External ear normal.  Abdominal: Soft. Bowel sounds are normal. She exhibits no distension and no mass. There is tenderness. There is no rebound, no guarding and no CVA tenderness.   Periumbilical and RLQ tenderness, no rebound tenderness  Neurological: She is alert and oriented to person, place, and time. No cranial nerve deficit. She exhibits normal muscle tone. Coordination normal.  Skin: Skin is warm and dry. No rash noted. She is not diaphoretic.  Psychiatric: She has a normal mood and affect. Her behavior is normal. Judgment and thought content normal.   Assessment & Plan:   Victoria Hanson was seen today for flank pain.  Diagnoses and all orders for this visit:  Dysuria -     POCT Urinalysis Dipstick -     Urine culture  Diarrhea -     US Abdomen Complete; Future  Right lower quadrant abdominal pain -     US Abdomen Complete; Future   I am having [name] maintain her multivitamin, aspirin, mometasone, triamterene-hydrochlorothiazide, omeprazole, potassium chloride SA, ALPRAZolam, nystatin, PROAIR HFA, traZODone, and amoxicillin-clavulanate.  No orders of the defined types were placed in this encounter.     Follow-up: Return if symptoms worsen or fail to improve.

## 2015-02-04 ENCOUNTER — Other Ambulatory Visit: Payer: Self-pay | Admitting: Nurse Practitioner

## 2015-02-04 DIAGNOSIS — K838 Other specified diseases of biliary tract: Secondary | ICD-10-CM

## 2015-02-04 DIAGNOSIS — R1084 Generalized abdominal pain: Secondary | ICD-10-CM

## 2015-02-06 ENCOUNTER — Encounter: Payer: Self-pay | Admitting: Emergency Medicine

## 2015-02-06 ENCOUNTER — Emergency Department
Admission: EM | Admit: 2015-02-06 | Discharge: 2015-02-06 | Disposition: A | Discharge status: Home / Self Care | Payer: Medicare PPO | Attending: Emergency Medicine | Admitting: Emergency Medicine

## 2015-02-06 DIAGNOSIS — R300 Dysuria: Secondary | ICD-10-CM | POA: Diagnosis not present

## 2015-02-06 DIAGNOSIS — Z7951 Long term (current) use of inhaled steroids: Secondary | ICD-10-CM | POA: Insufficient documentation

## 2015-02-06 DIAGNOSIS — Z87440 Personal history of urinary (tract) infections: Secondary | ICD-10-CM | POA: Insufficient documentation

## 2015-02-06 DIAGNOSIS — Z79899 Other long term (current) drug therapy: Secondary | ICD-10-CM | POA: Insufficient documentation

## 2015-02-06 DIAGNOSIS — Z7982 Long term (current) use of aspirin: Secondary | ICD-10-CM | POA: Insufficient documentation

## 2015-02-06 DIAGNOSIS — I10 Essential (primary) hypertension: Secondary | ICD-10-CM | POA: Diagnosis not present

## 2015-02-06 DIAGNOSIS — Z792 Long term (current) use of antibiotics: Secondary | ICD-10-CM | POA: Insufficient documentation

## 2015-02-06 DIAGNOSIS — R1031 Right lower quadrant pain: Secondary | ICD-10-CM | POA: Diagnosis not present

## 2015-02-06 LAB — COMPREHENSIVE METABOLIC PANEL
ALK PHOS: 89 U/L (ref 38–126)
ALT: 13 U/L — AB (ref 14–54)
ANION GAP: 8 (ref 5–15)
AST: 29 U/L (ref 15–41)
Albumin: 3.9 g/dL (ref 3.5–5.0)
BILIRUBIN TOTAL: 0.8 mg/dL (ref 0.3–1.2)
BUN: 21 mg/dL — ABNORMAL HIGH (ref 6–20)
CALCIUM: 9.4 mg/dL (ref 8.9–10.3)
CO2: 27 mmol/L (ref 22–32)
CREATININE: 1.48 mg/dL — AB (ref 0.44–1.00)
Chloride: 104 mmol/L (ref 101–111)
GFR, EST AFRICAN AMERICAN: 42 mL/min — AB (ref >60)
GFR, EST NON AFRICAN AMERICAN: 36 mL/min — AB (ref >60)
Glucose, Bld: 102 mg/dL — ABNORMAL HIGH (ref 65–99)
Potassium: 4.6 mmol/L (ref 3.5–5.1)
SODIUM: 139 mmol/L (ref 135–145)
TOTAL PROTEIN: 7.2 g/dL (ref 6.5–8.1)

## 2015-02-06 LAB — URINALYSIS COMPLETE WITH MICROSCOPIC (ARMC ONLY)
BACTERIA UA: NONE SEEN
BILIRUBIN URINE: NEGATIVE
GLUCOSE, UA: NEGATIVE mg/dL
KETONES UR: NEGATIVE mg/dL
NITRITE: NEGATIVE
Protein, ur: NEGATIVE mg/dL
SPECIFIC GRAVITY, URINE: 1.011 (ref 1.005–1.030)
pH: 5 (ref 5.0–8.0)

## 2015-02-06 LAB — CBC WITH DIFFERENTIAL/PLATELET
BASOS PCT: 1 %
Basophils Absolute: 0.1 10*3/uL (ref 0–0.1)
EOS ABS: 0.1 10*3/uL (ref 0–0.7)
EOS PCT: 1 %
HCT: 36.3 % (ref 35.0–47.0)
HEMOGLOBIN: 12.1 g/dL (ref 12.0–16.0)
LYMPHS ABS: 3 10*3/uL (ref 1.0–3.6)
Lymphocytes Relative: 27 %
MCH: 28.2 pg (ref 26.0–34.0)
MCHC: 33.3 g/dL (ref 32.0–36.0)
MCV: 84.6 fL (ref 80.0–100.0)
Monocytes Absolute: 0.6 10*3/uL (ref 0.2–0.9)
Monocytes Relative: 6 %
NEUTROS PCT: 65 %
Neutro Abs: 7.1 10*3/uL — ABNORMAL HIGH (ref 1.4–6.5)
PLATELETS: 238 10*3/uL (ref 150–440)
RBC: 4.29 MIL/uL (ref 3.80–5.20)
RDW: 13.6 % (ref 11.5–14.5)
WBC: 10.9 10*3/uL (ref 3.6–11.0)

## 2015-02-06 LAB — URINE CULTURE

## 2015-02-06 LAB — LIPASE, BLOOD: LIPASE: 19 U/L — AB (ref 22–51)

## 2015-02-06 MED ORDER — CEPHALEXIN 500 MG PO CAPS
500.0000 mg | ORAL_CAPSULE | Freq: Once | ORAL | Status: AC
  Administered 2015-02-06: 500 mg via ORAL

## 2015-02-06 MED ORDER — CEPHALEXIN 500 MG PO CAPS
ORAL_CAPSULE | ORAL | Status: AC
  Administered 2015-02-06: 500 mg via ORAL
  Filled 2015-02-06: qty 1

## 2015-02-06 MED ORDER — CEPHALEXIN 500 MG PO CAPS: 500.0000 mg | ORAL_CAPSULE | Freq: Three times a day (TID) | ORAL | Status: AC

## 2015-02-06 NOTE — ED Provider Notes (Signed)
[facility] Emergency Department Provider Note   ____________________________________________  Time seen: 2005  I have reviewed the triage vital signs and the nursing notes.   HISTORY  Chief Complaint Abdominal Pain   History limited by: Not Limited   HPI Victoria Hanson is a 64 y.o. female who presents to the emergency department today with concerns for right lower quadrant abdominal pain. The patient states this pain has been present for the past 3 months. She states that the pain has been constant. It is described as an ache. It does not radiate. The patient has had associated painful urination during this time. She has seen her primary care providers and they have given her course of antibiotic (augmentin) for a UTI. She states that she did not get any relief of her symptoms with that treatment. She denies any fevers. Has had some nausea occasionally. No bloody stool.      Past Medical History  Diagnosis Date  . Colon cancer 1999    Pt had partial colectomy. Did develop lung metastasis. Had right upper lobectomy in 01/2007 then had associated chemotherapy.  . Hypercholesterolemia   . Hypertension   . Depression   . Nephrolithiasis   . Elevated transaminase level     ? NASH  . GERD (gastroesophageal reflux disease)   . Holter monitor, abnormal 12/2009    NSR, rare PACs and PVCs  . Echocardiogram abnormal 02/2010    EF 55-60%, mild LAE, no regional wall motion abnormalities  . Lung cancer     S/P resection (lobectomy - right)  . COPD (chronic obstructive pulmonary disease)     Patient Active Problem List   Diagnosis Date Noted  . Diarrhea 02/03/2015  . Right lower quadrant abdominal pain 02/03/2015  . Wound, open, forearm 12/29/2014  . Swelling of lower extremity 11/20/2014  . Loose stools 11/14/2014  . Dysuria 10/24/2014  . Flatulence, eructation and gas pain 10/24/2014  . Health care maintenance 07/17/2014  . Hypokalemia 04/05/2013   . TIA (transient ischemic attack) 02/09/2013  . Chest pain at rest 05/07/2012  . Anxiety, mild 03/13/2012  . Depression 03/13/2012  . Lung cancer 03/13/2012  . Colon cancer 03/13/2012  . Hypertension 03/13/2012  . Renal insufficiency 03/13/2012  . GERD (gastroesophageal reflux disease) 03/13/2012  . Hypercholesterolemia 02/02/2010    Past Surgical History  Procedure Laterality Date  . Colon surgery      Colon cancer, removed part of large colon  . Lobectomy      Right lung, upper lobe right side removed  . Cardiac catheterization  2003    negative as per pt report  . Cholecystectomy  2000  . Tubal ligation  1980  . Total abdominal hysterectomy      abnormal bleeding  . Breast biopsy  10/07/2009    Right breast    Current Outpatient Rx  Name  Route  Sig  Dispense  Refill  . ALPRAZolam (XANAX) 0.5 MG tablet   Oral   Take 1 tablet (0.5 mg total) by mouth daily as needed for anxiety.   60 tablet   0   . amoxicillin-clavulanate (AUGMENTIN) 500-125 MG per tablet   Oral   Take 1 tablet (500 mg total) by mouth 2 (two) times daily. Complete entire course   10 tablet   0   . aspirin 81 MG tablet   Oral   Take 81 mg by mouth daily.         . mometasone (NASONEX) 50 MCG/ACT  nasal spray   Nasal   Place 2 sprays into the nose daily.   17 g   6   . Multiple Vitamin (MULTIVITAMIN) tablet   Oral   Take 1 tablet by mouth daily.         Marland Kitchen nystatin (MYCOSTATIN) 100000 UNIT/ML suspension      5 mls swish and spit tid   60 mL   0   . omeprazole (PRILOSEC) 20 MG capsule      TAKE ONE (1) CAPSULE EACH DAY   30 capsule   6   . potassium chloride SA (K-DUR,KLOR-CON) 20 MEQ tablet      TAKE ONE TABLET TWICE DAILY   60 tablet   6   . PROAIR HFA 108 (90 BASE) MCG/ACT inhaler      INHALE TWO PUFFS INTO THE LUNGS EVERY 6 HOURS AS NEEDED FOR WHEEZING   8.5 g   6   . traZODone (DESYREL) 50 MG tablet      TAKE TWO AND ONE-HALF TABLETS AT BEDTIME   90 tablet    1   . triamterene-hydrochlorothiazide (MAXZIDE-25) 37.5-25 MG per tablet      TAKE ONE (1) TABLET EACH DAY   30 tablet   6     Allergies Antihistamines, diphenhydramine-type; Aspirin; Ceftin; Celecoxib; Ciprofloxacin; Cymbalta; Lexapro; Macrobid; Metronidazole; and Zoloft  Family History  Problem Relation Age of Onset  . Coronary artery disease Mother   . Heart attack Father 9    MI  . Cancer Father     lung  . Stroke Father   . Hypertension Father   . Sudden death Brother   . Mental illness Mother   . Mental illness Brother   . Diabetes Mother     Social History Social History  Substance Use Topics  . Smoking status: Never Smoker   . Smokeless tobacco: Never Used  . Alcohol Use: No    Review of Systems   Constitutional: Negative for fever. Cardiovascular: Negative for chest pain. Respiratory: Negative for shortness of breath. Gastrointestinal: Positive for right lower quadrant pain Genitourinary: Positive for dysuria. Musculoskeletal: Negative for back pain. Skin: Negative for rash. Neurological: Negative for headaches, focal weakness or numbness.   10-point ROS otherwise negative.  ____________________________________________   PHYSICAL EXAM:  VITAL SIGNS: ED Triage Vitals  Enc Vitals Group     BP 02/06/15 1919 158/78 mmHg     Pulse Rate 02/06/15 1919 68     Resp 02/06/15 1919 18     Temp 02/06/15 1919 98.1 F (36.7 C)     Temp Source 02/06/15 1919 Oral     SpO2 02/06/15 1919 98 %     Weight 02/06/15 1919 200 lb (90.719 kg)     Height 02/06/15 1919 '5\' 4"'$  (1.626 m)     Head Cir --      Peak Flow --      Pain Score 02/06/15 1921 7   Constitutional: Alert and oriented. Well appearing and in no distress. Eyes: Conjunctivae are normal. PERRL. Normal extraocular movements. ENT   Head: Normocephalic and atraumatic.   Nose: No congestion/rhinnorhea.   Mouth/Throat: Mucous membranes are moist.   Neck: No  stridor. Hematological/Lymphatic/Immunilogical: No cervical lymphadenopathy. Cardiovascular: Normal rate, regular rhythm.  No murmurs, rubs, or gallops. Respiratory: Normal respiratory effort without tachypnea nor retractions. Breath sounds are clear and equal bilaterally. No wheezes/rales/rhonchi. Gastrointestinal: Soft and minimally tender in the right lower quadrant. No rebound. No guarding. No Rosvings sign. No CVA tenderness.  Genitourinary: Deferred Musculoskeletal: Normal range of motion in all extremities. No joint effusions.  No lower extremity tenderness nor edema. Neurologic:  Normal speech and language. No gross focal neurologic deficits are appreciated. Speech is normal.  Skin:  Skin is warm, dry and intact. No rash noted. Psychiatric: Mood and affect are normal. Speech and behavior are normal. Patient exhibits appropriate insight and judgment.  ____________________________________________    LABS (pertinent positives/negatives)  Labs Reviewed  URINALYSIS COMPLETEWITH MICROSCOPIC ([place]) - Abnormal; Notable for the following:    Color, Urine STRAW (*)    APPearance CLEAR (*)    Hgb urine dipstick 1+ (*)    Leukocytes, UA 2+ (*)    Squamous Epithelial / LPF 0-5 (*)    All other components within normal limits  COMPREHENSIVE METABOLIC PANEL - Abnormal; Notable for the following:    Glucose, Bld 102 (*)    BUN 21 (*)    Creatinine, Ser 1.48 (*)    ALT 13 (*)    GFR calc non Af Amer 36 (*)    GFR calc Af Amer 42 (*)    All other components within normal limits  LIPASE, BLOOD - Abnormal; Notable for the following:    Lipase 19 (*)    All other components within normal limits  CBC WITH DIFFERENTIAL/PLATELET - Abnormal; Notable for the following:    Neutro Abs 7.1 (*)    All other components within normal limits  CBC WITH DIFFERENTIAL/PLATELET     ____________________________________________   EKG  None  ____________________________________________     RADIOLOGY  None  ____________________________________________   PROCEDURES  Procedure(s) performed: None  Critical Care performed: No  ____________________________________________   INITIAL IMPRESSION / ASSESSMENT AND PLAN / ED COURSE  Pertinent labs & imaging results that were available during my care of the patient were reviewed by me and considered in my medical decision making (see chart for details).  Patient presented to the emergency department today with continued right lower quadrant pain. She states this been going on for 3 weeks. On exam patient minimally tender in the right lower quadrant without rebound or guarding. Blood work did not show any leukocytosis. At this point I think appendicitis extremely unlikely given longevity of symptoms and lack of leukocytosis or fever. Patient has been treated for urinary tract infection and culture 3 days ago was positive for gram-negative rods. Urine today did not show any white blood cells however patient has recently been on antibiotics. At this point I will change antibiotic given likely UTI. I did discuss potential get a CT scan today however given slightly elevated creatinine and very low suspicion for appendicitis I feel plan for outpatient CT appropriate.  ____________________________________________   FINAL CLINICAL IMPRESSION(S) / ED DIAGNOSES  Final diagnoses:  Right lower quadrant abdominal pain     Nance Pear, MD 02/06/15 2321

## 2015-02-06 NOTE — Discharge Instructions (Signed)
Please seek medical attention for any high fevers, chest pain, shortness of breath, change in behavior, persistent vomiting, bloody stool or any other new or concerning symptoms. ° °Urinary Tract Infection °Urinary tract infections (UTIs) can develop anywhere along your urinary tract. Your urinary tract is your body's drainage system for removing wastes and extra water. Your urinary tract includes two kidneys, two ureters, a bladder, and a urethra. Your kidneys are a pair of bean-shaped organs. Each kidney is about the size of your fist. They are located below your ribs, one on each side of your spine. °CAUSES °Infections are caused by microbes, which are microscopic organisms, including fungi, viruses, and bacteria. These organisms are so small that they can only be seen through a microscope. Bacteria are the microbes that most commonly cause UTIs. °SYMPTOMS  °Symptoms of UTIs may vary by age and gender of the patient and by the location of the infection. Symptoms in young women typically include a frequent and intense urge to urinate and a painful, burning feeling in the bladder or urethra during urination. Older women and men are more likely to be tired, shaky, and weak and have muscle aches and abdominal pain. A fever may mean the infection is in your kidneys. Other symptoms of a kidney infection include pain in your back or sides below the ribs, nausea, and vomiting. °DIAGNOSIS °To diagnose a UTI, your caregiver will ask you about your symptoms. Your caregiver also will ask to provide a urine sample. The urine sample will be tested for bacteria and white blood cells. White blood cells are made by your body to help fight infection. °TREATMENT  °Typically, UTIs can be treated with medication. Because most UTIs are caused by a bacterial infection, they usually can be treated with the use of antibiotics. The choice of antibiotic and length of treatment depend on your symptoms and the type of bacteria causing your  infection. °HOME CARE INSTRUCTIONS °· If you were prescribed antibiotics, take them exactly as your caregiver instructs you. Finish the medication even if you feel better after you have only taken some of the medication. °· Drink enough water and fluids to keep your urine clear or pale yellow. °· Avoid caffeine, tea, and carbonated beverages. They tend to irritate your bladder. °· Empty your bladder often. Avoid holding urine for long periods of time. °· Empty your bladder before and after sexual intercourse. °· After a bowel movement, women should cleanse from front to back. Use each tissue only once. °SEEK MEDICAL CARE IF:  °· You have back pain. °· You develop a fever. °· Your symptoms do not begin to resolve within 3 days. °SEEK IMMEDIATE MEDICAL CARE IF:  °· You have severe back pain or lower abdominal pain. °· You develop chills. °· You have nausea or vomiting. °· You have continued burning or discomfort with urination. °MAKE SURE YOU:  °· Understand these instructions. °· Will watch your condition. °· Will get help right away if you are not doing well or get worse. °Document Released: 03/07/2005 Document Revised: 11/27/2011 Document Reviewed: 07/06/2011 °[employer]® Patient Information ©2015 [employer]. This information is not intended to replace advice given to you by your health care provider. Make sure you discuss any questions you have with your health care provider. ° °

## 2015-02-06 NOTE — ED Notes (Signed)
Pt. States rt. Lower abdominal pain for the past couple weeks.  Pt. States mild dysuria.  Pt. States pain upon palpation in lower rt. Quadrant.  Pt. States she had been treated for UTI infection this month in which she has finished antibiotic treatment.

## 2015-02-07 ENCOUNTER — Telehealth: Payer: Self-pay | Admitting: Internal Medicine

## 2015-02-07 NOTE — Telephone Encounter (Signed)
Pt called to inquire about a CT scan. I did not see a CT scan scheduled or referred. Have a great day!

## 2015-02-07 NOTE — Telephone Encounter (Signed)
Ct abd scheduled by Morey Hummingbird on 8.26.16.  Please advise pt

## 2015-02-08 ENCOUNTER — Telehealth: Payer: Self-pay | Admitting: Nurse Practitioner

## 2015-02-08 ENCOUNTER — Other Ambulatory Visit: Payer: Self-pay | Admitting: Nurse Practitioner

## 2015-02-08 DIAGNOSIS — R1031 Right lower quadrant pain: Secondary | ICD-10-CM

## 2015-02-08 NOTE — Telephone Encounter (Signed)
See note

## 2015-02-09 ENCOUNTER — Telehealth: Payer: Self-pay | Admitting: Internal Medicine

## 2015-02-11 NOTE — Telephone Encounter (Signed)
disregard

## 2015-02-16 ENCOUNTER — Ambulatory Visit
Admission: RE | Admit: 2015-02-16 | Discharge: 2015-02-16 | Disposition: A | Discharge status: Home / Self Care | Payer: Medicare PPO | Source: Ambulatory Visit | Attending: Nurse Practitioner | Admitting: Nurse Practitioner

## 2015-02-16 DIAGNOSIS — I700 Atherosclerosis of aorta: Secondary | ICD-10-CM | POA: Insufficient documentation

## 2015-02-16 DIAGNOSIS — K439 Ventral hernia without obstruction or gangrene: Secondary | ICD-10-CM | POA: Diagnosis not present

## 2015-02-16 DIAGNOSIS — R1084 Generalized abdominal pain: Secondary | ICD-10-CM

## 2015-02-16 DIAGNOSIS — K838 Other specified diseases of biliary tract: Secondary | ICD-10-CM

## 2015-02-25 ENCOUNTER — Other Ambulatory Visit: Payer: Self-pay | Admitting: *Deleted

## 2015-02-25 DIAGNOSIS — C189 Malignant neoplasm of colon, unspecified: Secondary | ICD-10-CM

## 2015-02-28 ENCOUNTER — Telehealth: Payer: Self-pay | Admitting: *Deleted

## 2015-02-28 NOTE — Telephone Encounter (Signed)
Patient stated that she missed a call on Saturday,02/26/15. Patient requested a call back -thanks

## 2015-02-28 NOTE — Telephone Encounter (Signed)
Spoke with patient to go over ct results & we were disconnected. Tried to call her back & it went to a voicemail that has not been setup.

## 2015-03-01 NOTE — Telephone Encounter (Signed)
Finished discussing results with patient today. See result note

## 2015-03-02 ENCOUNTER — Inpatient Hospital Stay: Payer: Medicare PPO | Admitting: Oncology

## 2015-03-02 ENCOUNTER — Inpatient Hospital Stay: Payer: Medicare PPO

## 2015-03-14 ENCOUNTER — Other Ambulatory Visit (INDEPENDENT_AMBULATORY_CARE_PROVIDER_SITE_OTHER): Payer: Medicare PPO

## 2015-03-14 ENCOUNTER — Encounter (INDEPENDENT_AMBULATORY_CARE_PROVIDER_SITE_OTHER): Payer: Self-pay

## 2015-03-14 DIAGNOSIS — R739 Hyperglycemia, unspecified: Secondary | ICD-10-CM

## 2015-03-14 DIAGNOSIS — I10 Essential (primary) hypertension: Secondary | ICD-10-CM | POA: Diagnosis not present

## 2015-03-14 DIAGNOSIS — C189 Malignant neoplasm of colon, unspecified: Secondary | ICD-10-CM | POA: Diagnosis not present

## 2015-03-14 DIAGNOSIS — E7800 Pure hypercholesterolemia, unspecified: Secondary | ICD-10-CM | POA: Diagnosis not present

## 2015-03-14 LAB — LIPID PANEL
CHOL/HDL RATIO: 4
Cholesterol: 203 mg/dL — ABNORMAL HIGH (ref 0–200)
HDL: 49.7 mg/dL (ref >39.00)
LDL CALC: 128 mg/dL — AB (ref 0–99)
NONHDL: 153.44
Triglycerides: 126 mg/dL (ref 0.0–149.0)
VLDL: 25.2 mg/dL (ref 0.0–40.0)

## 2015-03-14 LAB — BASIC METABOLIC PANEL
BUN: 19 mg/dL (ref 6–23)
CALCIUM: 9.8 mg/dL (ref 8.4–10.5)
CO2: 27 mEq/L (ref 19–32)
CREATININE: 1.26 mg/dL — AB (ref 0.40–1.20)
Chloride: 105 mEq/L (ref 96–112)
GFR: 45.25 mL/min — AB (ref >60.00)
Glucose, Bld: 106 mg/dL — ABNORMAL HIGH (ref 70–99)
Potassium: 4.4 mEq/L (ref 3.5–5.1)
SODIUM: 140 meq/L (ref 135–145)

## 2015-03-14 LAB — HEMOGLOBIN A1C: HEMOGLOBIN A1C: 6 % (ref 4.6–6.5)

## 2015-03-14 LAB — HEPATIC FUNCTION PANEL
ALT: 10 U/L (ref 0–35)
AST: 15 U/L (ref 0–37)
Albumin: 3.7 g/dL (ref 3.5–5.2)
Alkaline Phosphatase: 85 U/L (ref 39–117)
BILIRUBIN DIRECT: 0 mg/dL (ref 0.0–0.3)
BILIRUBIN TOTAL: 0.3 mg/dL (ref 0.2–1.2)
Total Protein: 7.1 g/dL (ref 6.0–8.3)

## 2015-03-15 ENCOUNTER — Ambulatory Visit (INDEPENDENT_AMBULATORY_CARE_PROVIDER_SITE_OTHER): Payer: Medicare PPO | Admitting: Internal Medicine

## 2015-03-15 ENCOUNTER — Encounter: Payer: Self-pay | Admitting: Internal Medicine

## 2015-03-15 VITALS — BP 120/72 | HR 68 | Temp 98.0°F | Resp 18 | Ht 64.0 in | Wt 198.0 lb

## 2015-03-15 DIAGNOSIS — G459 Transient cerebral ischemic attack, unspecified: Secondary | ICD-10-CM

## 2015-03-15 DIAGNOSIS — I10 Essential (primary) hypertension: Secondary | ICD-10-CM | POA: Diagnosis not present

## 2015-03-15 DIAGNOSIS — K219 Gastro-esophageal reflux disease without esophagitis: Secondary | ICD-10-CM

## 2015-03-15 DIAGNOSIS — F32A Depression, unspecified: Secondary | ICD-10-CM

## 2015-03-15 DIAGNOSIS — F329 Major depressive disorder, single episode, unspecified: Secondary | ICD-10-CM

## 2015-03-15 DIAGNOSIS — Z23 Encounter for immunization: Secondary | ICD-10-CM

## 2015-03-15 DIAGNOSIS — E7800 Pure hypercholesterolemia, unspecified: Secondary | ICD-10-CM

## 2015-03-15 DIAGNOSIS — E876 Hypokalemia: Secondary | ICD-10-CM

## 2015-03-15 DIAGNOSIS — R197 Diarrhea, unspecified: Secondary | ICD-10-CM

## 2015-03-15 DIAGNOSIS — C189 Malignant neoplasm of colon, unspecified: Secondary | ICD-10-CM | POA: Diagnosis not present

## 2015-03-15 DIAGNOSIS — C3490 Malignant neoplasm of unspecified part of unspecified bronchus or lung: Secondary | ICD-10-CM | POA: Diagnosis not present

## 2015-03-15 DIAGNOSIS — N289 Disorder of kidney and ureter, unspecified: Secondary | ICD-10-CM

## 2015-03-15 DIAGNOSIS — R739 Hyperglycemia, unspecified: Secondary | ICD-10-CM

## 2015-03-15 DIAGNOSIS — F419 Anxiety disorder, unspecified: Secondary | ICD-10-CM

## 2015-03-15 DIAGNOSIS — R1031 Right lower quadrant pain: Secondary | ICD-10-CM

## 2015-03-15 NOTE — Progress Notes (Signed)
Pre-visit discussion using our clinic review tool. No additional management support is needed unless otherwise documented below in the visit note.  

## 2015-03-15 NOTE — Progress Notes (Signed)
Patient ID: Victoria Hanson, female   DOB: 1950-12-25, 64 y.o.   MRN: [id number]   Subjective:    Patient ID: Victoria Hanson, female    DOB: September 05, 1950, 64 y.o.   MRN: [id number]  HPI  Patient with past history of hypertension, TIA, colon cancer, renal insufficiency, lung cancer and hypercholesterolemia.  She comes in today to follow up on these issues.  She is still having some issues with her bowels.  Still with loose stool.  Has to take immodium prn.  Has f/u planned with Dawson Bills this month.  Recently has been having issues with right lower quadrant pain.  See previous note for details.  CT as outlined.  No acute abnormality.  Pain only occurs with certain position changes and movements.  No other abdominal pain or cramping.  No vomiting.  No chest pain or tightness.  No sob.  No cough or congestion.     Past Medical History  Diagnosis Date  . Colon cancer ([place]) 1999    Pt had partial colectomy. Did develop lung metastasis. Had right upper lobectomy in 01/2007 then had associated chemotherapy.  . Hypercholesterolemia   . Hypertension   . Depression   . Nephrolithiasis   . Elevated transaminase level     ? NASH  . GERD (gastroesophageal reflux disease)   . Holter monitor, abnormal 12/2009    NSR, rare PACs and PVCs  . Echocardiogram abnormal 02/2010    EF 55-60%, mild LAE, no regional wall motion abnormalities  . Lung cancer ([place])     S/P resection (lobectomy - right)  . COPD (chronic obstructive pulmonary disease) [facility])    Past Surgical History  Procedure Laterality Date  . Colon surgery      Colon cancer, removed part of large colon  . Lobectomy      Right lung, upper lobe right side removed  . Cardiac catheterization  2003    negative as per pt report  . Cholecystectomy  2000  . Tubal ligation  1980  . Total abdominal hysterectomy      abnormal bleeding  . Breast biopsy  10/07/2009    Right breast   Family History  Problem Relation Age of Onset  . Coronary artery  disease Mother   . Heart attack Father 22    MI  . Cancer Father     lung  . Stroke Father   . Hypertension Father   . Sudden death Brother   . Mental illness Mother   . Mental illness Brother   . Diabetes Mother    Social History   Social History  . Marital Status: Widowed    Spouse Name: N/A  . Number of Children: N/A  . Years of Education: N/A   Social History Main Topics  . Smoking status: Never Smoker   . Smokeless tobacco: Never Used  . Alcohol Use: No  . Drug Use: No  . Sexual Activity: Not Asked   Other Topics Concern  . None   Social History Narrative   Widowed   Disabled   Gets regular exercise, walks and plays volleyball    Outpatient Encounter Prescriptions as of 03/15/2015  Medication Sig  . ALPRAZolam (XANAX) 0.5 MG tablet Take 1 tablet (0.5 mg total) by mouth daily as needed for anxiety.  Marland Kitchen aspirin 81 MG tablet Take 81 mg by mouth daily.  . mometasone (NASONEX) 50 MCG/ACT nasal spray Place 2 sprays into the nose daily.  . Multiple Vitamin (MULTIVITAMIN) tablet Take  1 tablet by mouth daily.  Marland Kitchen omeprazole (PRILOSEC) 20 MG capsule TAKE ONE (1) CAPSULE EACH DAY  . potassium chloride SA (K-DUR,KLOR-CON) 20 MEQ tablet TAKE ONE TABLET TWICE DAILY  . PROAIR HFA 108 (90 BASE) MCG/ACT inhaler INHALE TWO PUFFS INTO THE LUNGS EVERY 6 HOURS AS NEEDED FOR WHEEZING  . traZODone (DESYREL) 50 MG tablet TAKE TWO AND ONE-HALF TABLETS AT BEDTIME  . triamterene-hydrochlorothiazide (MAXZIDE-25) 37.5-25 MG per tablet TAKE ONE (1) TABLET EACH DAY  . [DISCONTINUED] nystatin (MYCOSTATIN) 100000 UNIT/ML suspension 5 mls swish and spit tid  . [DISCONTINUED] amoxicillin-clavulanate (AUGMENTIN) 500-125 MG per tablet Take 1 tablet (500 mg total) by mouth 2 (two) times daily. Complete entire course   No facility-administered encounter medications on file as of 03/15/2015.    Review of Systems  Constitutional: Negative for appetite change and unexpected weight change.  HENT:  Negative for congestion and sinus pressure.   Eyes: Negative for pain and visual disturbance.  Respiratory: Negative for cough, chest tightness and shortness of breath.   Cardiovascular: Negative for chest pain, palpitations and leg swelling.  Gastrointestinal: Positive for diarrhea. Negative for nausea, vomiting and abdominal pain (some RLQ pain - intermittent as outlined.  ).  Genitourinary: Negative for dysuria and difficulty urinating.  Musculoskeletal: Negative for back pain and joint swelling.  Skin: Negative for color change and rash.  Neurological: Negative for dizziness, light-headedness and headaches.  Psychiatric/Behavioral: Negative for dysphoric mood and agitation.       Objective:    Physical Exam  Constitutional: She appears well-developed and well-nourished. No distress.  HENT:  Nose: Nose normal.  Mouth/Throat: Oropharynx is clear and moist.  Eyes: Conjunctivae are normal. Right eye exhibits no discharge. Left eye exhibits no discharge.  Neck: Neck supple. No thyromegaly present.  Cardiovascular: Normal rate and regular rhythm.   Pulmonary/Chest: Breath sounds normal. No respiratory distress. She has no wheezes.  Abdominal: Soft. Bowel sounds are normal. There is no tenderness.  Musculoskeletal: She exhibits no edema or tenderness.  Lymphadenopathy:    She has no cervical adenopathy.  Skin: No rash noted. No erythema.  Psychiatric: She has a normal mood and affect. Her behavior is normal.    BP 120/72 mmHg  Pulse 68  Temp(Src) 98 F (36.7 C) (Oral)  Resp 18  Ht _0  (1.626 m)  Wt 198 lb (89.812 kg)  BMI 33.97 kg/m2  SpO2 99% Wt Readings from Last 3 Encounters:  03/15/15 198 lb (89.812 kg)  02/06/15 200 lb (90.719 kg)  02/03/15 200 lb 9.6 oz (90.992 kg)     Lab Results  Component Value Date   WBC 10.9 02/06/2015   HGB 12.1 02/06/2015   HCT 36.3 02/06/2015   PLT 238 02/06/2015   GLUCOSE 106* 03/14/2015   CHOL 203* 03/14/2015   TRIG 126.0  03/14/2015   HDL 49.70 03/14/2015   LDLDIRECT 163.4 12/31/2012   LDLCALC 128* 03/14/2015   ALT 10 03/14/2015   AST 15 03/14/2015   NA 140 03/14/2015   K 4.4 03/14/2015   CL 105 03/14/2015   CREATININE 1.26* 03/14/2015   BUN 19 03/14/2015   CO2 27 03/14/2015   TSH 2.51 11/12/2014   INR 1.0 12/12/2012   HGBA1C 6.0 03/14/2015    Ct Abdomen Pelvis Wo Contrast  02/16/2015   CLINICAL DATA:  Right lower quadrant abdominal pain for 1 month.  EXAM: CT ABDOMEN AND PELVIS WITHOUT CONTRAST  TECHNIQUE: Multidetector CT imaging of the abdomen and pelvis was performed following the standard  protocol without IV contrast.  COMPARISON:  CT scan of April 28, 2012.  FINDINGS: Visualized lung bases are unremarkable. No significant osseous abnormality is noted.  Status post cholecystectomy. No focal abnormality is noted in the liver, spleen or pancreas on these unenhanced images. Adrenal glands and kidneys appear normal. No hydronephrosis or renal obstruction is noted. No renal or ureteral calculi are noted. Atherosclerosis of abdominal aorta is noted without aneurysm formation. The appendix appears normal. There is no evidence of bowel obstruction. Urinary bladder is decompressed. Status post hysterectomy. Small fat containing ventral hernia is noted in the pelvis. No significant adenopathy is noted.  IMPRESSION: Atherosclerosis of abdominal aorta without aneurysm formation.  Normal appendix.  Small fat containing ventral hernia is noted in the pelvis.   Electronically Signed   By: Marijo Conception, M.D.   On: 02/16/2015 09:29       Assessment & Plan:   Problem List Items Addressed This Visit    Anxiety, mild    Has xanax rx if needed.  Rarely takes now.        Colon cancer ([place])    History of colon cancer.  Colonoscopy 07/08/13 - internal hemorrhoids.  Recommended f/u colonoscopy in three years.        Relevant Orders   Hepatic function panel   Depression    Uses xanax prn.  Rarely needs now.  Doing  better.  Coping with death of her son-n-law.  Follow.        Diarrhea    Persistent.  Have tried probiotics.  Intermittent flares.  She has appt with GI soon for reevaluation.        GERD (gastroesophageal reflux disease)    Upper symptoms controlled.  On omeprazole.  Follow.        Hypercholesterolemia    Unable to tolerate statins.  Tried cholestyramine.  Did not tolerate.  Trying to adjust her diet.  Discussed diet and exercise.  Follow lipid panel.   Lab Results  Component Value Date   CHOL 203* 03/14/2015   HDL 49.70 03/14/2015   LDLCALC 128* 03/14/2015   LDLDIRECT 163.4 12/31/2012   TRIG 126.0 03/14/2015   CHOLHDL 4 03/14/2015        Relevant Orders   Lipid panel   Hyperglycemia    Low carb diet and exercise.  Follow met b and a1c.       Relevant Orders   Hemoglobin A1c   Hypertension    Blood pressure under good control.  Continue same medication regimen.  Follow pressures.  Follow metabolic panel.        Relevant Orders   Basic metabolic panel   Hypokalemia    Follow potassium level.        Lung cancer ([place])    Followed by Dr Oliva Bustard.       Renal insufficiency    Previous renal ultrasound revealed no significant abnormalities.  Stay hydrated.  Follow renal function.   Lab Results  Component Value Date   CREATININE 1.26* 03/14/2015        Right lower quadrant abdominal pain    Some persistent pain.  Only notices with position changes and certain movements.  No other abdominal pain or cramping.  CT as outlined.  Discussed further w/up.  She wants to monitor at this time.  Follow.        TIA (transient ischemic attack)    On aspirin daily.  Previous w/up included carotid ultrasound that revealed no significant stenosis.  Did have changes c/w fibromuscular dysplasia.  MRI revealed no acute abnormality.  Chronic white matter changes present.  Had CTA - evaluated by vascular surgery.  States ok.  Currently doing well.  Follow        Other Visit  Diagnoses    Encounter for immunization    -  Primary        Einar Pheasant, MD

## 2015-03-15 NOTE — Patient Instructions (Signed)

## 2015-03-20 ENCOUNTER — Encounter: Payer: Self-pay | Admitting: Internal Medicine

## 2015-03-20 DIAGNOSIS — R739 Hyperglycemia, unspecified: Secondary | ICD-10-CM | POA: Insufficient documentation

## 2015-03-20 NOTE — Assessment & Plan Note (Signed)
Followed by Dr Oliva Bustard.

## 2015-03-20 NOTE — Assessment & Plan Note (Signed)
Blood pressure under good control.  Continue same medication regimen.  Follow pressures.  Follow metabolic panel.   

## 2015-03-20 NOTE — Assessment & Plan Note (Signed)
History of colon cancer.  Colonoscopy 07/08/13 - internal hemorrhoids.  Recommended f/u colonoscopy in three years.

## 2015-03-20 NOTE — Assessment & Plan Note (Signed)
Upper symptoms controlled.  On omeprazole.  Follow.

## 2015-03-20 NOTE — Assessment & Plan Note (Signed)
Has xanax rx if needed.  Rarely takes now.

## 2015-03-20 NOTE — Assessment & Plan Note (Signed)
Uses xanax prn.  Rarely needs now.  Doing better.  Coping with death of her son-n-law.  Follow.

## 2015-03-20 NOTE — Assessment & Plan Note (Signed)
Low carb diet and exercise.  Follow met b and a1c.  

## 2015-03-20 NOTE — Assessment & Plan Note (Signed)
Some persistent pain.  Only notices with position changes and certain movements.  No other abdominal pain or cramping.  CT as outlined.  Discussed further w/up.  She wants to monitor at this time.  Follow.

## 2015-03-20 NOTE — Assessment & Plan Note (Signed)
Previous renal ultrasound revealed no significant abnormalities.  Stay hydrated.  Follow renal function.   Lab Results  Component Value Date   CREATININE 1.26* 03/14/2015

## 2015-03-20 NOTE — Assessment & Plan Note (Signed)
Persistent.  Have tried probiotics.  Intermittent flares.  She has appt with GI soon for reevaluation.

## 2015-03-20 NOTE — Assessment & Plan Note (Signed)
Unable to tolerate statins.  Tried cholestyramine.  Did not tolerate.  Trying to adjust her diet.  Discussed diet and exercise.  Follow lipid panel.   Lab Results  Component Value Date   CHOL 203* 03/14/2015   HDL 49.70 03/14/2015   LDLCALC 128* 03/14/2015   LDLDIRECT 163.4 12/31/2012   TRIG 126.0 03/14/2015   CHOLHDL 4 03/14/2015

## 2015-03-20 NOTE — Assessment & Plan Note (Signed)
On aspirin daily.  Previous w/up included carotid ultrasound that revealed no significant stenosis.  Did have changes c/w fibromuscular dysplasia.  MRI revealed no acute abnormality.  Chronic white matter changes present.  Had CTA - evaluated by vascular surgery.  States ok.  Currently doing well.  Follow

## 2015-03-20 NOTE — Assessment & Plan Note (Signed)
Follow potassium level.

## 2015-03-25 ENCOUNTER — Other Ambulatory Visit: Payer: Self-pay | Admitting: Internal Medicine

## 2015-03-28 DIAGNOSIS — N399 Disorder of urinary system, unspecified: Secondary | ICD-10-CM | POA: Insufficient documentation

## 2015-03-28 DIAGNOSIS — N289 Disorder of kidney and ureter, unspecified: Secondary | ICD-10-CM | POA: Insufficient documentation

## 2015-04-08 ENCOUNTER — Ambulatory Visit (INDEPENDENT_AMBULATORY_CARE_PROVIDER_SITE_OTHER): Payer: Medicare PPO | Admitting: Family Medicine

## 2015-04-08 ENCOUNTER — Encounter: Payer: Self-pay | Admitting: Family Medicine

## 2015-04-08 VITALS — BP 132/88 | HR 72 | Temp 98.0°F | Ht 64.0 in | Wt 199.0 lb

## 2015-04-08 DIAGNOSIS — J0100 Acute maxillary sinusitis, unspecified: Secondary | ICD-10-CM

## 2015-04-08 MED ORDER — DOXYCYCLINE HYCLATE 100 MG PO TABS: 100.0000 mg | ORAL_TABLET | Freq: Two times a day (BID) | ORAL | Status: DC

## 2015-04-08 MED ORDER — MOMETASONE FUROATE 50 MCG/ACT NA SUSP: 2.0000 | Freq: Every day | NASAL | Status: DC

## 2015-04-08 NOTE — Progress Notes (Signed)
   Subjective:  Patient ID: Victoria Hanson, female    DOB: [birth year]  Age: 64 y.o. MRN: [id number]  CC: Concerned about sinus infection  HPI:  64 year old female with a complicated past medical history including history of colon and lung cancer presents to clinic today for an acute visit with complaints of sore throat, drainage, fever, ear pain.  Patient reports that she developed sore throat, postnasal drainage, ear pain, fever, and nasal congestion with associated discomfort yesterday. She reports her symptoms are severe. She She is concerned that she has a sinus infection. No exacerbating or relieving factors.  No reported sick contacts.   Social Hx   Social History   Social History  . Marital Status: Widowed    Spouse Name: N/A  . Number of Children: N/A  . Years of Education: N/A   Social History Main Topics  . Smoking status: Never Smoker   . Smokeless tobacco: Never Used  . Alcohol Use: No  . Drug Use: No  . Sexual Activity: Not Asked   Other Topics Concern  . None   Social History Narrative   Widowed   Disabled   Gets regular exercise, walks and plays volleyball   Review of Systems  Constitutional: Positive for fever.  HENT: Positive for congestion, ear pain, sinus pressure and sore throat.    Objective:  BP 132/88 mmHg  Pulse 72  Temp(Src) 98 F (36.7 C) (Oral)  Ht '5\' 4"'$  (1.626 m)  Wt 199 lb (90.266 kg)  BMI 34.14 kg/m2  SpO2 97%  BP/Weight 04/08/2015 03/15/2015 5/36/6440  Systolic BP 347 425 956  Diastolic BP 88 72 80  Wt. (Lbs) 199 198 200  BMI 34.14 33.97 34.31    Physical Exam  Constitutional: She appears well-developed. No distress.  HENT:  Head: Normocephalic and atraumatic.  Mouth/Throat: No oropharyngeal exudate.  Normal TMs bilaterally. Oropharynx mildly erythematous. Maxillary sinuses tender to palpation bilaterally.  Eyes: Conjunctivae are normal.  Neck: Neck supple.  Cardiovascular: Normal rate and regular rhythm.     Pulmonary/Chest: Effort normal and breath sounds normal. She has no wheezes. She has no rales.  Lymphadenopathy:    She has no cervical adenopathy.  Neurological: She is alert.  Vitals reviewed.  Assessment & Plan:   Problem List Items Addressed This Visit    Acute maxillary sinusitis - Primary    Given patient's past medical history/comorbidities, I have elected to treat empirically with antibiotics. Rx for doxycycline sent. Additionally, treating with Nasonex.      Relevant Medications   mometasone (NASONEX) 50 MCG/ACT nasal spray   doxycycline (VIBRA-TABS) 100 MG tablet      Meds ordered this encounter  Medications  . mometasone (NASONEX) 50 MCG/ACT nasal spray    Sig: Place 2 sprays into the nose daily.    Dispense:  17 g    Refill:  6  . doxycycline (VIBRA-TABS) 100 MG tablet    Sig: Take 1 tablet (100 mg total) by mouth 2 (two) times daily.    Dispense:  20 tablet    Refill:  0    Follow-up: PRN  Thersa Salt, DO

## 2015-04-08 NOTE — Patient Instructions (Signed)
Take the antibiotic as prescribed.  Use the nasonex daily.  Follow up closely with Dr. Nicki Reaper  Take care  Dr. Lacinda Axon

## 2015-04-08 NOTE — Progress Notes (Signed)
Pre visit review using our clinic review tool, if applicable. No additional management support is needed unless otherwise documented below in the visit note. 

## 2015-04-08 NOTE — Assessment & Plan Note (Signed)
Given patient's past medical history/comorbidities, I have elected to treat empirically with antibiotics. Rx for doxycycline sent. Additionally, treating with Nasonex.

## 2015-04-13 ENCOUNTER — Ambulatory Visit (INDEPENDENT_AMBULATORY_CARE_PROVIDER_SITE_OTHER): Payer: Medicare PPO | Admitting: Family Medicine

## 2015-04-13 ENCOUNTER — Other Ambulatory Visit: Payer: Self-pay | Admitting: Internal Medicine

## 2015-04-13 ENCOUNTER — Encounter: Payer: Self-pay | Admitting: Family Medicine

## 2015-04-13 VITALS — BP 130/76 | HR 77 | Temp 97.6°F | Ht 64.0 in | Wt 197.4 lb

## 2015-04-13 DIAGNOSIS — M542 Cervicalgia: Secondary | ICD-10-CM | POA: Diagnosis not present

## 2015-04-13 MED ORDER — TRAMADOL HCL 50 MG PO TABS: 50.0000 mg | ORAL_TABLET | Freq: Three times a day (TID) | ORAL | Status: DC | PRN

## 2015-04-13 MED ORDER — CYCLOBENZAPRINE HCL 5 MG PO TABS: 5.0000 mg | ORAL_TABLET | Freq: Three times a day (TID) | ORAL | Status: DC | PRN

## 2015-04-13 NOTE — Patient Instructions (Signed)
This is likely muscle injury and spasm.  Use heat and tylenol.  I have prescribed tramadol if you need it for pain.  I have also prescribed a muscle relaxer that you can use (I would start with it at night).  Take care  Dr. Lacinda Axon

## 2015-04-13 NOTE — Progress Notes (Signed)
   Subjective:  Patient ID: Victoria Hanson, female    DOB: 1951-01-06  Age: 64 y.o. MRN: [id number]  CC: Shoulder, Neck pain  HPI:  63 year old female, get a past medical history presents today for an acute visit with complaints of shoulder/neck pain.  Patient reports a 3 day history of left sided neck pain and left shoulder pain.  She thinks this is related to recent injury from lifting several large bottles. No interventions tried. Worsened with movement. No relieving factors. No reports of numbness or tingling. Pain is mild to moderate in severity.  Social Hx   Social History   Social History  . Marital Status: Widowed    Spouse Name: N/A  . Number of Children: N/A  . Years of Education: N/A   Social History Main Topics  . Smoking status: Never Smoker   . Smokeless tobacco: Never Used  . Alcohol Use: No  . Drug Use: No  . Sexual Activity: Not Asked   Other Topics Concern  . None   Social History Narrative   Widowed   Disabled   Gets regular exercise, walks and plays volleyball   Review of Systems  Constitutional: Negative.   Musculoskeletal: Positive for neck pain.       Left shoulder pain.   Neurological: Positive for numbness.   Objective:  BP 130/76 mmHg  Pulse 77  Temp(Src) 97.6 F (36.4 C) (Oral)  Ht '5\' 4"'$  (1.626 m)  Wt 197 lb 6 oz (89.529 kg)  BMI 33.86 kg/m2  SpO2 96%  BP/Weight 04/13/2015 04/08/2015 71/0/6269  Systolic BP 485 462 703  Diastolic BP 76 88 72  Wt. (Lbs) 197.38 199 198  BMI 33.86 34.14 33.97   Physical Exam  Constitutional: She appears well-developed. No distress.  Cardiovascular: Normal rate and regular rhythm.   Pulmonary/Chest: Effort normal.  Musculoskeletal:  Decreased ROM of the neck. Significant trapezius muscle tension noted. Full ROM of shoulder.    Psychiatric: She has a normal mood and affect.   Assessment & Plan:   Problem List Items Addressed This Visit    Neck pain - Primary    With radiation to the trapezius  region/anterior shoulder. History and exam consistent with muscle injury and spasm. Treating with heat, Tylenol, tramadol, and flexeril.          Meds ordered this encounter  Medications  . traMADol (ULTRAM) 50 MG tablet    Sig: Take 1 tablet (50 mg total) by mouth every 8 (eight) hours as needed.    Dispense:  30 tablet    Refill:  0  . cyclobenzaprine (FLEXERIL) 5 MG tablet    Sig: Take 1 tablet (5 mg total) by mouth 3 (three) times daily as needed for muscle spasms.    Dispense:  30 tablet    Refill:  0    Follow-up: PRN  Thersa Salt, DO

## 2015-04-13 NOTE — Progress Notes (Signed)
Pre visit review using our clinic review tool, if applicable. No additional management support is needed unless otherwise documented below in the visit note. 

## 2015-04-13 NOTE — Assessment & Plan Note (Signed)
With radiation to the trapezius region/anterior shoulder. History and exam consistent with muscle injury and spasm. Treating with heat, Tylenol, tramadol, and flexeril.

## 2015-04-14 NOTE — Telephone Encounter (Signed)
Okay to refill? Last seen in October. Last apporved in August.

## 2015-04-15 NOTE — Telephone Encounter (Signed)
ok'd refill for trazodone #90 with no refills.

## 2015-04-19 ENCOUNTER — Inpatient Hospital Stay: Payer: Medicare PPO | Admitting: Oncology

## 2015-04-19 ENCOUNTER — Inpatient Hospital Stay: Payer: Medicare PPO

## 2015-04-20 ENCOUNTER — Other Ambulatory Visit: Payer: Self-pay | Admitting: Internal Medicine

## 2015-04-20 NOTE — Telephone Encounter (Signed)
Pt called stating she went to the pharmacy to pick up her medication of traZODone (DESYREL) 50 MG tablet but the dosage was written incorrect. Pt states it should be 2 1/2 not 1 1/2. Pharmacy is [facility]. Thank You!

## 2015-04-22 ENCOUNTER — Other Ambulatory Visit: Payer: Self-pay | Admitting: *Deleted

## 2015-04-22 ENCOUNTER — Encounter: Payer: Self-pay | Admitting: *Deleted

## 2015-04-22 LAB — HM DIABETES EYE EXAM

## 2015-04-22 NOTE — Telephone Encounter (Signed)
Patient called today with the same issue about her trazodone Rx written wrong, the Rx should read 90day supply, '50mg'$  2 1/2 tablets a day

## 2015-04-22 NOTE — Telephone Encounter (Signed)
SEE BELOW.

## 2015-04-25 ENCOUNTER — Ambulatory Visit: Payer: Medicare PPO | Admitting: Anesthesiology

## 2015-04-25 ENCOUNTER — Encounter
Admission: RE | Disposition: A | Discharge status: Home / Self Care | Payer: Self-pay | Source: Ambulatory Visit | Attending: Unknown Physician Specialty

## 2015-04-25 ENCOUNTER — Encounter: Payer: Self-pay | Admitting: *Deleted

## 2015-04-25 ENCOUNTER — Ambulatory Visit
Admission: RE | Admit: 2015-04-25 | Discharge: 2015-04-25 | Disposition: A | Discharge status: Home / Self Care | Payer: Medicare PPO | Source: Ambulatory Visit | Attending: Unknown Physician Specialty | Admitting: Unknown Physician Specialty

## 2015-04-25 DIAGNOSIS — K219 Gastro-esophageal reflux disease without esophagitis: Secondary | ICD-10-CM | POA: Diagnosis not present

## 2015-04-25 DIAGNOSIS — Z886 Allergy status to analgesic agent status: Secondary | ICD-10-CM | POA: Diagnosis not present

## 2015-04-25 DIAGNOSIS — K529 Noninfective gastroenteritis and colitis, unspecified: Secondary | ICD-10-CM | POA: Diagnosis not present

## 2015-04-25 DIAGNOSIS — J449 Chronic obstructive pulmonary disease, unspecified: Secondary | ICD-10-CM | POA: Insufficient documentation

## 2015-04-25 DIAGNOSIS — Z79899 Other long term (current) drug therapy: Secondary | ICD-10-CM | POA: Insufficient documentation

## 2015-04-25 DIAGNOSIS — Z85118 Personal history of other malignant neoplasm of bronchus and lung: Secondary | ICD-10-CM | POA: Insufficient documentation

## 2015-04-25 DIAGNOSIS — Z883 Allergy status to other anti-infective agents status: Secondary | ICD-10-CM | POA: Diagnosis not present

## 2015-04-25 DIAGNOSIS — K648 Other hemorrhoids: Secondary | ICD-10-CM | POA: Diagnosis not present

## 2015-04-25 DIAGNOSIS — Z1211 Encounter for screening for malignant neoplasm of colon: Secondary | ICD-10-CM | POA: Insufficient documentation

## 2015-04-25 DIAGNOSIS — F329 Major depressive disorder, single episode, unspecified: Secondary | ICD-10-CM | POA: Insufficient documentation

## 2015-04-25 DIAGNOSIS — M5137 Other intervertebral disc degeneration, lumbosacral region: Secondary | ICD-10-CM | POA: Diagnosis not present

## 2015-04-25 DIAGNOSIS — E7800 Pure hypercholesterolemia, unspecified: Secondary | ICD-10-CM | POA: Diagnosis not present

## 2015-04-25 DIAGNOSIS — I10 Essential (primary) hypertension: Secondary | ICD-10-CM | POA: Diagnosis not present

## 2015-04-25 DIAGNOSIS — Z902 Acquired absence of lung [part of]: Secondary | ICD-10-CM | POA: Diagnosis not present

## 2015-04-25 DIAGNOSIS — Z85038 Personal history of other malignant neoplasm of large intestine: Secondary | ICD-10-CM | POA: Insufficient documentation

## 2015-04-25 DIAGNOSIS — Z9049 Acquired absence of other specified parts of digestive tract: Secondary | ICD-10-CM | POA: Diagnosis not present

## 2015-04-25 DIAGNOSIS — F419 Anxiety disorder, unspecified: Secondary | ICD-10-CM | POA: Insufficient documentation

## 2015-04-25 DIAGNOSIS — R1031 Right lower quadrant pain: Secondary | ICD-10-CM | POA: Insufficient documentation

## 2015-04-25 DIAGNOSIS — Z9109 Other allergy status, other than to drugs and biological substances: Secondary | ICD-10-CM | POA: Diagnosis not present

## 2015-04-25 HISTORY — DX: Hematuria, unspecified: R31.9

## 2015-04-25 HISTORY — DX: Endocarditis, valve unspecified: I38

## 2015-04-25 HISTORY — DX: Other intervertebral disc degeneration, lumbosacral region without mention of lumbar back pain or lower extremity pain: M51.379

## 2015-04-25 HISTORY — PX: COLONOSCOPY: SHX5424

## 2015-04-25 HISTORY — DX: Other intervertebral disc degeneration, lumbosacral region: M51.37

## 2015-04-25 HISTORY — DX: Anxiety disorder, unspecified: F41.9

## 2015-04-25 LAB — HM COLONOSCOPY

## 2015-04-25 SURGERY — COLONOSCOPY
Anesthesia: General

## 2015-04-25 MED ORDER — MIDAZOLAM HCL 5 MG/5ML IJ SOLN
INTRAMUSCULAR | Status: DC | PRN
Start: 2015-04-25 — End: 2015-04-25
  Administered 2015-04-25: 1 mg via INTRAVENOUS

## 2015-04-25 MED ORDER — SODIUM CHLORIDE 0.9 % IV SOLN: INTRAVENOUS | Status: DC

## 2015-04-25 MED ORDER — PROPOFOL 500 MG/50ML IV EMUL
INTRAVENOUS | Status: DC | PRN
  Administered 2015-04-25: 100 ug/kg/min via INTRAVENOUS

## 2015-04-25 MED ORDER — FENTANYL CITRATE (PF) 100 MCG/2ML IJ SOLN
INTRAMUSCULAR | Status: DC | PRN
  Administered 2015-04-25: 50 ug via INTRAVENOUS

## 2015-04-25 MED ORDER — LIDOCAINE HCL (CARDIAC) 20 MG/ML IV SOLN
INTRAVENOUS | Status: DC | PRN
  Administered 2015-04-25: 100 mg via INTRAVENOUS

## 2015-04-25 MED ORDER — TRAZODONE HCL 50 MG PO TABS: ORAL_TABLET | ORAL | Status: DC

## 2015-04-25 MED ORDER — PROPOFOL 10 MG/ML IV BOLUS
INTRAVENOUS | Status: DC | PRN
  Administered 2015-04-25: 50 mg via INTRAVENOUS

## 2015-04-25 MED ORDER — SODIUM CHLORIDE 0.9 % IV SOLN
INTRAVENOUS | Status: DC
  Administered 2015-04-25: 16:00:00 via INTRAVENOUS

## 2015-04-25 NOTE — Anesthesia Postprocedure Evaluation (Signed)
  Anesthesia Post-op Note  Patient: Victoria Hanson  Procedure(s) Performed: Procedure(s): COLONOSCOPY (N/A)  Anesthesia type:General  Patient location: PACU  Post pain: Pain level controlled  Post assessment: Post-op Vital signs reviewed, Patient's Cardiovascular Status Stable, Respiratory Function Stable, Patent Airway and No signs of Nausea or vomiting  Post vital signs: Reviewed and stable  Last Vitals:  Filed Vitals:   04/25/15 1806  BP: 154/75  Pulse: 72  Temp:   Resp: 18    Level of consciousness: awake, alert  and patient cooperative  Complications: No apparent anesthesia complications

## 2015-04-25 NOTE — Anesthesia Preprocedure Evaluation (Signed)
Anesthesia Evaluation  Patient identified by MRN, date of birth, ID band Patient awake    Reviewed: Allergy & Precautions, H&P , NPO status , Patient's Chart, lab work & pertinent test results, reviewed documented beta blocker date and time   History of Anesthesia Complications (+) DIFFICULT AIRWAY  Airway Mallampati: II  TM Distance: >3 FB Neck ROM: full    Dental no notable dental hx.    Pulmonary neg pulmonary ROS, COPD,    Pulmonary exam normal breath sounds clear to auscultation       Cardiovascular Exercise Tolerance: Good hypertension, negative cardio ROS   Rhythm:regular Rate:Normal     Neuro/Psych PSYCHIATRIC DISORDERS TIAnegative neurological ROS  negative psych ROS   GI/Hepatic negative GI ROS, Neg liver ROS, GERD  ,  Endo/Other  negative endocrine ROS  Renal/GU Renal diseasenegative Renal ROS  negative genitourinary   Musculoskeletal   Abdominal   Peds  Hematology negative hematology ROS (+)   Anesthesia Other Findings   Reproductive/Obstetrics negative OB ROS                             Anesthesia Physical Anesthesia Plan  ASA: III  Anesthesia Plan: General   Post-op Pain Management:    Induction:   Airway Management Planned:   Additional Equipment:   Intra-op Plan:   Post-operative Plan:   Informed Consent: I have reviewed the patients History and Physical, chart, labs and discussed the procedure including the risks, benefits and alternatives for the proposed anesthesia with the patient or authorized representative who has indicated his/her understanding and acceptance.   Dental Advisory Given  Plan Discussed with: CRNA  Anesthesia Plan Comments:         Anesthesia Quick Evaluation

## 2015-04-25 NOTE — Transfer of Care (Signed)
Immediate Anesthesia Transfer of Care Note  Patient: Victoria Hanson  Procedure(s) Performed: Procedure(s): COLONOSCOPY (N/A)  Patient Location: PACU  Anesthesia Type:General  Level of Consciousness: sedated  Airway & Oxygen Therapy: Patient Spontanous Breathing and Patient connected to nasal cannula oxygen  Post-op Assessment: Report given to RN and Post -op Vital signs reviewed and stable  Post vital signs: Reviewed and stable  Last Vitals:  Filed Vitals:   04/25/15 1747  BP: 93/42  Pulse: 71  Temp: 36.5 C  Resp: 18    Complications: No apparent anesthesia complications

## 2015-04-25 NOTE — Telephone Encounter (Signed)
Please confirm with pt who increased to this dose.

## 2015-04-25 NOTE — Telephone Encounter (Signed)
Pt.notified

## 2015-04-25 NOTE — H&P (Signed)
Primary Care Physician:  Einar Pheasant, MD Primary Gastroenterologist:  Dr. Vira Agar  Pre-Procedure History & Physical: HPI:  Victoria Hanson is a 64 y.o. female is here for an colonoscopy.   Past Medical History  Diagnosis Date  . Colon cancer ([place]) 1999    Pt had partial colectomy. Did develop lung metastasis. Had right upper lobectomy in 01/2007 then had associated chemotherapy.  . Hypercholesterolemia   . Hypertension   . Depression   . Nephrolithiasis   . Elevated transaminase level     ? NASH  . GERD (gastroesophageal reflux disease)   . Holter monitor, abnormal 12/2009    NSR, rare PACs and PVCs  . Echocardiogram abnormal 02/2010    EF 55-60%, mild LAE, no regional wall motion abnormalities  . Lung cancer ([place])     S/P resection (lobectomy - right)  . COPD (chronic obstructive pulmonary disease) ([place])   . Anxiety   . DDD (degenerative disc disease), lumbosacral   . Hematuria   . Leaky heart valve     Past Surgical History  Procedure Laterality Date  . Colon surgery      Colon cancer, removed part of large colon  . Lobectomy      Right lung, upper lobe right side removed  . Cardiac catheterization  2003    negative as per pt report  . Cholecystectomy  2000  . Tubal ligation  1980  . Total abdominal hysterectomy      abnormal bleeding  . Breast biopsy  10/07/2009    Right breast    Prior to Admission medications   Medication Sig Start Date End Date Taking? Authorizing Provider  ALPRAZolam Duanne Moron) 0.5 MG tablet Take 1 tablet (0.5 mg total) by mouth daily as needed for anxiety. 11/12/14  Yes Einar Pheasant, MD  triamterene-hydrochlorothiazide (MAXZIDE-25) 37.5-25 MG per tablet TAKE ONE (1) TABLET EACH DAY 04/13/14  Yes Einar Pheasant, MD  aspirin 81 MG tablet Take 81 mg by mouth daily.    Historical Provider, MD  cyclobenzaprine (FLEXERIL) 5 MG tablet Take 1 tablet (5 mg total) by mouth 3 (three) times daily as needed for muscle spasms. 04/13/15   Coral Spikes,  DO  doxycycline (VIBRA-TABS) 100 MG tablet Take 1 tablet (100 mg total) by mouth 2 (two) times daily. 04/08/15   Jayce G Cook, DO  mometasone (NASONEX) 50 MCG/ACT nasal spray Place 2 sprays into the nose daily. 04/08/15   Coral Spikes, DO  Multiple Vitamin (MULTIVITAMIN) tablet Take 1 tablet by mouth daily.    Historical Provider, MD  omeprazole (PRILOSEC) 20 MG capsule TAKE ONE (1) CAPSULE EACH DAY 03/25/15   Einar Pheasant, MD  potassium chloride SA (K-DUR,KLOR-CON) 20 MEQ tablet TAKE ONE TABLET TWICE DAILY 11/11/14   Einar Pheasant, MD  PROAIR HFA 108 (90 BASE) MCG/ACT inhaler INHALE TWO PUFFS INTO THE LUNGS EVERY 6 HOURS AS NEEDED FOR WHEEZING 12/28/14   Einar Pheasant, MD  traMADol (ULTRAM) 50 MG tablet Take 1 tablet (50 mg total) by mouth every 8 (eight) hours as needed. 04/13/15   Coral Spikes, DO  traZODone (DESYREL) 50 MG tablet Take 2 1/2 tablet q hs 04/25/15   Einar Pheasant, MD    Allergies as of 04/01/2015 - Review Complete 03/20/2015  Allergen Reaction Noted  . Antihistamines, diphenhydramine-type  10/18/2014  . Aspirin  10/18/2014  . Ceftin [cefuroxime axetil] Other (See Comments) 04/24/2012  . Celecoxib  10/18/2014  . Ciprofloxacin  12/31/2012  . Cymbalta [duloxetine hcl]  04/24/2012  . Lexapro [escitalopram oxalate]  02/19/2013  . Macrobid [nitrofurantoin macrocrystal]  04/24/2012  . Metronidazole  10/18/2014  . Zoloft [sertraline hcl]  04/24/2012    Family History  Problem Relation Age of Onset  . Coronary artery disease Mother   . Heart attack Father 46    MI  . Cancer Father     lung  . Stroke Father   . Hypertension Father   . Sudden death Brother   . Mental illness Mother   . Mental illness Brother   . Diabetes Mother     Social History   Social History  . Marital Status: Widowed    Spouse Name: N/A  . Number of Children: N/A  . Years of Education: N/A   Occupational History  . Not on file.   Social History Main Topics  . Smoking status: Never  Smoker   . Smokeless tobacco: Never Used  . Alcohol Use: 0.6 oz/week    0 Standard drinks or equivalent, 1 Glasses of wine per week  . Drug Use: No  . Sexual Activity: Not on file   Other Topics Concern  . Not on file   Social History Narrative   Widowed   Disabled   Gets regular exercise, walks and plays volleyball    Review of Systems: See HPI, otherwise negative ROS  Physical Exam: BP 130/79 mmHg  Pulse 79  Temp(Src) 98.7 F (37.1 C) (Tympanic)  Resp 16  Ht '5\' 4"'$  (1.626 m)  Wt 89.812 kg (198 lb)  BMI 33.97 kg/m2  SpO2 100% General:   Alert,  pleasant and cooperative in NAD Head:  Normocephalic and atraumatic. Neck:  Supple; no masses or thyromegaly. Lungs:  Clear throughout to auscultation.    Heart:  Regular rate and rhythm. Abdomen:  Soft, nontender and nondistended. Normal bowel sounds, without guarding, and without rebound.   Neurologic:  Alert and  oriented x4;  grossly normal neurologically.  Impression/Plan: Victoria Hanson is here for an colonoscopy to be performed for personal history of colon cancer  Risks, benefits, limitations, and alternatives regarding  colonoscopy have been reviewed with the patient.  Questions have been answered.  All parties agreeable.   Gaylyn Cheers, MD  04/25/2015, 5:21 PM

## 2015-04-25 NOTE — Op Note (Signed)
[facility] Gastroenterology Patient Name: Victoria Hanson Procedure Date: 04/25/2015 5:21 PM MRN: [id number] Account #: [id number] Date of Birth: 10/24/1950 Admit Type: Outpatient Age: 64 Room: [facility] ENDO ROOM 1 Gender: Female Note Status: Finalized Procedure:         Colonoscopy Indications:       High risk colon cancer surveillance: Personal history of                     colon cancer Providers:         Manya Silvas, MD Referring MD:      Einar Pheasant, MD (Referring MD) Medicines:         Propofol per Anesthesia Complications:     No immediate complications. Procedure:         Pre-Anesthesia Assessment:                    - After reviewing the risks and benefits, the patient was                     deemed in satisfactory condition to undergo the procedure.                    After obtaining informed consent, the colonoscope was                     passed under direct vision. Throughout the procedure, the                     patient's blood pressure, pulse, and oxygen saturations                     were monitored continuously. The Colonoscope was                     introduced through the anus and advanced to the the cecum,                     identified by appendiceal orifice and ileocecal valve. The                     colonoscopy was performed without difficulty. The quality                     of the bowel preparation was [Prep Quality]. The patient                     tolerated the procedure well. Findings:      Internal hemorrhoids were found during endoscopy. The hemorrhoids were       small.      The area of previous surgery seen. The lining of the entire colon was       normal and due o diarrhea I biopsied the colon in ascending, transverse,       descending and sigmoid colon. Impression:        - Internal hemorrhoids.                    - No specimens collected. Recommendation:    - Await pathology results. Manya Silvas, MD 04/25/2015  5:44:33 PM This report has been signed electronically. Number of Addenda: 0 Note Initiated On: 04/25/2015 5:21 PM Scope Withdrawal Time: 0 hours 8 minutes 14 seconds  Total Procedure Duration: 0 hours 14 minutes 36 seconds  [facility]

## 2015-04-25 NOTE — Telephone Encounter (Signed)
Refilled trazodone to take 2 1/2 tablets q hs #90 with one refill.

## 2015-04-25 NOTE — Telephone Encounter (Signed)
Ok to refill this medication?

## 2015-04-25 NOTE — Telephone Encounter (Signed)
Patient called and stated that she has been taking 2 1/2 tablets q hs for over 6 months now. Patient was very irritated, stating that she needs her medicine and that this needs to be fixed because she is out of medicine. Please advise.

## 2015-04-25 NOTE — Telephone Encounter (Signed)
Pt is currently taking traZODone (DESYREL) 50 MG tablet 2 1/2 50 mg pt has been taking the dose about 6 months. Pharmacy is [facility]. Thank You!

## 2015-04-25 NOTE — Telephone Encounter (Signed)
Please let pt know that I was not in the office Friday.  I called [facility] and they report they have never filled the rx for 2 1/2 tablets q hs.  There is documentation in our note that this is the dose taking, but no rx change.  Please clarify with pt the dose she is currently taking and how long been on the medication.  Can get refilled, but need to clarify the dosing.

## 2015-04-26 NOTE — Telephone Encounter (Signed)
Left message for patient to return my call.

## 2015-04-27 ENCOUNTER — Encounter: Payer: Self-pay | Admitting: Unknown Physician Specialty

## 2015-04-27 LAB — SURGICAL PATHOLOGY

## 2015-05-03 ENCOUNTER — Observation Stay
Admission: EM | Admit: 2015-05-03 | Discharge: 2015-05-04 | Disposition: A | Discharge status: Home / Self Care | Payer: Medicare PPO | Attending: Internal Medicine | Admitting: Internal Medicine

## 2015-05-03 ENCOUNTER — Encounter: Payer: Self-pay | Admitting: Urgent Care

## 2015-05-03 DIAGNOSIS — F329 Major depressive disorder, single episode, unspecified: Secondary | ICD-10-CM | POA: Insufficient documentation

## 2015-05-03 DIAGNOSIS — Z881 Allergy status to other antibiotic agents status: Secondary | ICD-10-CM | POA: Insufficient documentation

## 2015-05-03 DIAGNOSIS — Z85118 Personal history of other malignant neoplasm of bronchus and lung: Secondary | ICD-10-CM | POA: Diagnosis not present

## 2015-05-03 DIAGNOSIS — K219 Gastro-esophageal reflux disease without esophagitis: Secondary | ICD-10-CM | POA: Insufficient documentation

## 2015-05-03 DIAGNOSIS — M542 Cervicalgia: Secondary | ICD-10-CM | POA: Diagnosis not present

## 2015-05-03 DIAGNOSIS — B964 Proteus (mirabilis) (morganii) as the cause of diseases classified elsewhere: Secondary | ICD-10-CM | POA: Insufficient documentation

## 2015-05-03 DIAGNOSIS — E7800 Pure hypercholesterolemia, unspecified: Secondary | ICD-10-CM | POA: Insufficient documentation

## 2015-05-03 DIAGNOSIS — Z1639 Resistance to other specified antimicrobial drug: Secondary | ICD-10-CM | POA: Insufficient documentation

## 2015-05-03 DIAGNOSIS — Z8673 Personal history of transient ischemic attack (TIA), and cerebral infarction without residual deficits: Secondary | ICD-10-CM | POA: Insufficient documentation

## 2015-05-03 DIAGNOSIS — E876 Hypokalemia: Secondary | ICD-10-CM | POA: Diagnosis not present

## 2015-05-03 DIAGNOSIS — Z85038 Personal history of other malignant neoplasm of large intestine: Secondary | ICD-10-CM | POA: Diagnosis not present

## 2015-05-03 DIAGNOSIS — Z888 Allergy status to other drugs, medicaments and biological substances status: Secondary | ICD-10-CM | POA: Insufficient documentation

## 2015-05-03 DIAGNOSIS — Z79899 Other long term (current) drug therapy: Secondary | ICD-10-CM | POA: Diagnosis not present

## 2015-05-03 DIAGNOSIS — Z818 Family history of other mental and behavioral disorders: Secondary | ICD-10-CM | POA: Insufficient documentation

## 2015-05-03 DIAGNOSIS — J449 Chronic obstructive pulmonary disease, unspecified: Secondary | ICD-10-CM | POA: Insufficient documentation

## 2015-05-03 DIAGNOSIS — Z8249 Family history of ischemic heart disease and other diseases of the circulatory system: Secondary | ICD-10-CM | POA: Diagnosis not present

## 2015-05-03 DIAGNOSIS — J0100 Acute maxillary sinusitis, unspecified: Secondary | ICD-10-CM | POA: Diagnosis not present

## 2015-05-03 DIAGNOSIS — N390 Urinary tract infection, site not specified: Secondary | ICD-10-CM | POA: Insufficient documentation

## 2015-05-03 DIAGNOSIS — M5137 Other intervertebral disc degeneration, lumbosacral region: Secondary | ICD-10-CM | POA: Diagnosis not present

## 2015-05-03 DIAGNOSIS — F419 Anxiety disorder, unspecified: Secondary | ICD-10-CM | POA: Diagnosis not present

## 2015-05-03 DIAGNOSIS — Z833 Family history of diabetes mellitus: Secondary | ICD-10-CM | POA: Insufficient documentation

## 2015-05-03 DIAGNOSIS — I2510 Atherosclerotic heart disease of native coronary artery without angina pectoris: Secondary | ICD-10-CM | POA: Diagnosis not present

## 2015-05-03 DIAGNOSIS — Z902 Acquired absence of lung [part of]: Secondary | ICD-10-CM | POA: Diagnosis not present

## 2015-05-03 DIAGNOSIS — N183 Chronic kidney disease, stage 3 (moderate): Secondary | ICD-10-CM | POA: Insufficient documentation

## 2015-05-03 DIAGNOSIS — Z87442 Personal history of urinary calculi: Secondary | ICD-10-CM | POA: Insufficient documentation

## 2015-05-03 DIAGNOSIS — R079 Chest pain, unspecified: Secondary | ICD-10-CM | POA: Diagnosis not present

## 2015-05-03 DIAGNOSIS — M7989 Other specified soft tissue disorders: Secondary | ICD-10-CM | POA: Diagnosis not present

## 2015-05-03 DIAGNOSIS — I1310 Hypertensive heart and chronic kidney disease without heart failure, with stage 1 through stage 4 chronic kidney disease, or unspecified chronic kidney disease: Secondary | ICD-10-CM | POA: Insufficient documentation

## 2015-05-03 DIAGNOSIS — Z9049 Acquired absence of other specified parts of digestive tract: Secondary | ICD-10-CM | POA: Diagnosis not present

## 2015-05-03 DIAGNOSIS — Z823 Family history of stroke: Secondary | ICD-10-CM | POA: Insufficient documentation

## 2015-05-03 DIAGNOSIS — Z886 Allergy status to analgesic agent status: Secondary | ICD-10-CM | POA: Insufficient documentation

## 2015-05-03 LAB — CBC
HEMATOCRIT: 36.9 % (ref 35.0–47.0)
HEMOGLOBIN: 12 g/dL (ref 12.0–16.0)
MCH: 27.7 pg (ref 26.0–34.0)
MCHC: 32.6 g/dL (ref 32.0–36.0)
MCV: 85.1 fL (ref 80.0–100.0)
Platelets: 239 10*3/uL (ref 150–440)
RBC: 4.34 MIL/uL (ref 3.80–5.20)
RDW: 13.3 % (ref 11.5–14.5)
WBC: 11.7 10*3/uL — ABNORMAL HIGH (ref 3.6–11.0)

## 2015-05-03 NOTE — ED Provider Notes (Signed)
[facility] Emergency Department Provider Note  ____________________________________________  Time seen: 11:40 PM  I have reviewed the triage vital signs and the nursing notes.   HISTORY  Chief Complaint Chest Pain      HPI Victoria Hanson is a 64 y.o. female presents with acute onset of intermittent central chest pain that lasts approximately "few minutes"which began at 9:00 PM tonight. Patient states the pain occasionally radiates to her left arm. Patient denies any dyspnea no diaphoresis no nausea or vomiting. Patient denies any dizziness or palpitations.     Past Medical History  Diagnosis Date  . Colon cancer ([place]) 1999    Pt had partial colectomy. Did develop lung metastasis. Had right upper lobectomy in 01/2007 then had associated chemotherapy.  . Hypercholesterolemia   . Hypertension   . Depression   . Nephrolithiasis   . Elevated transaminase level     ? NASH  . GERD (gastroesophageal reflux disease)   . Holter monitor, abnormal 12/2009    NSR, rare PACs and PVCs  . Echocardiogram abnormal 02/2010    EF 55-60%, mild LAE, no regional wall motion abnormalities  . Lung cancer ([place])     S/P resection (lobectomy - right)  . COPD (chronic obstructive pulmonary disease) ([place])   . Anxiety   . DDD (degenerative disc disease), lumbosacral   . Hematuria   . Leaky heart valve     Patient Active Problem List   Diagnosis Date Noted  . Neck pain 04/13/2015  . Acute maxillary sinusitis 04/08/2015  . Hyperglycemia 03/20/2015  . Diarrhea 02/03/2015  . Right lower quadrant abdominal pain 02/03/2015  . Wound, open, forearm 12/29/2014  . Swelling of lower extremity 11/20/2014  . Loose stools 11/14/2014  . Dysuria 10/24/2014  . Flatulence, eructation and gas pain 10/24/2014  . Health care maintenance 07/17/2014  . Hypokalemia 04/05/2013  . TIA (transient ischemic attack) 02/09/2013  . Chest pain at rest 05/07/2012  . Anxiety, mild 03/13/2012   . Depression 03/13/2012  . Lung cancer ([place]) 03/13/2012  . Colon cancer ([place]) 03/13/2012  . Hypertension 03/13/2012  . Renal insufficiency 03/13/2012  . GERD (gastroesophageal reflux disease) 03/13/2012  . Hypercholesterolemia 02/02/2010    Past Surgical History  Procedure Laterality Date  . Colon surgery      Colon cancer, removed part of large colon  . Lobectomy      Right lung, upper lobe right side removed  . Cardiac catheterization  2003    negative as per pt report  . Cholecystectomy  2000  . Tubal ligation  1980  . Total abdominal hysterectomy      abnormal bleeding  . Breast biopsy  10/07/2009    Right breast  . Colonoscopy N/A 04/25/2015    Procedure: COLONOSCOPY;  Surgeon: Manya Silvas, MD;  Location: [facility] ENDOSCOPY;  Service: Endoscopy;  Laterality: N/A;    Current Outpatient Rx  Name  Route  Sig  Dispense  Refill  . ALPRAZolam (XANAX) 0.5 MG tablet   Oral   Take 1 tablet (0.5 mg total) by mouth daily as needed for anxiety.   60 tablet   0   . aspirin 81 MG tablet   Oral   Take 81 mg by mouth daily.         . cyclobenzaprine (FLEXERIL) 5 MG tablet   Oral   Take 1 tablet (5 mg total) by mouth 3 (three) times daily as needed for muscle spasms.   30 tablet   0   .  doxycycline (VIBRA-TABS) 100 MG tablet   Oral   Take 1 tablet (100 mg total) by mouth 2 (two) times daily.   20 tablet   0   . mometasone (NASONEX) 50 MCG/ACT nasal spray   Nasal   Place 2 sprays into the nose daily.   17 g   6   . Multiple Vitamin (MULTIVITAMIN) tablet   Oral   Take 1 tablet by mouth daily.         Marland Kitchen omeprazole (PRILOSEC) 20 MG capsule      TAKE ONE (1) CAPSULE EACH DAY   30 capsule   4   . potassium chloride SA (K-DUR,KLOR-CON) 20 MEQ tablet      TAKE ONE TABLET TWICE DAILY   60 tablet   6   . PROAIR HFA 108 (90 BASE) MCG/ACT inhaler      INHALE TWO PUFFS INTO THE LUNGS EVERY 6 HOURS AS NEEDED FOR WHEEZING   8.5 g   6   . traMADol (ULTRAM) 50  MG tablet   Oral   Take 1 tablet (50 mg total) by mouth every 8 (eight) hours as needed.   30 tablet   0   . traZODone (DESYREL) 50 MG tablet      Take 2 1/2 tablet q hs   90 tablet   1   . triamterene-hydrochlorothiazide (MAXZIDE-25) 37.5-25 MG per tablet      TAKE ONE (1) TABLET EACH DAY   30 tablet   6     Allergies Antihistamines, diphenhydramine-type; Aspirin; Ceftin; Celecoxib; Ciprofloxacin; Cymbalta; Diphenhydramine; Escitalopram; Lexapro; Macrobid; Metronidazole; and Zoloft  Family History  Problem Relation Age of Onset  . Coronary artery disease Mother   . Heart attack Father 51    MI  . Cancer Father     lung  . Stroke Father   . Hypertension Father   . Sudden death Brother   . Mental illness Mother   . Mental illness Brother   . Diabetes Mother     Social History Social History  Substance Use Topics  . Smoking status: Never Smoker   . Smokeless tobacco: Never Used  . Alcohol Use: 0.6 oz/week    0 Standard drinks or equivalent, 1 Glasses of wine per week    Review of Systems  Constitutional: Negative for fever. Eyes: Negative for visual changes. ENT: Negative for sore throat. Cardiovascular: Positive for chest pain. Respiratory: Negative for shortness of breath. Gastrointestinal: Negative for abdominal pain, vomiting and diarrhea. Genitourinary: Negative for dysuria. Musculoskeletal: Negative for back pain. Skin: Negative for rash. Neurological: Negative for headaches, focal weakness or numbness.   10-point ROS otherwise negative.  ____________________________________________   PHYSICAL EXAM:  VITAL SIGNS: ED Triage Vitals  Enc Vitals Group     BP 05/03/15 2246 158/77 mmHg     Pulse Rate 05/03/15 2246 65     Resp 05/03/15 2246 18     Temp 05/03/15 2246 98 F (36.7 C)     Temp Source 05/03/15 2246 Oral     SpO2 05/03/15 2246 100 %     Weight 05/03/15 2246 200 lb (90.719 kg)     Height 05/03/15 2246 '5\' 4"'$  (1.626 m)     Head Cir  --      Peak Flow --      Pain Score 05/03/15 2302 0     Pain Loc --      Pain Edu? --      Excl. in [place]? --  Constitutional: Alert and oriented. Well appearing and in no distress. Eyes: Conjunctivae are normal. PERRL. Normal extraocular movements. ENT   Head: Normocephalic and atraumatic.   Nose: No congestion/rhinnorhea.   Mouth/Throat: Mucous membranes are moist.   Neck: No stridor. Hematological/Lymphatic/Immunilogical: No cervical lymphadenopathy. Cardiovascular: Normal rate, regular rhythm. Normal and symmetric distal pulses are present in all extremities. No murmurs, rubs, or gallops. Respiratory: Normal respiratory effort without tachypnea nor retractions. Breath sounds are clear and equal bilaterally. No wheezes/rales/rhonchi. Gastrointestinal: Soft and nontender. No distention. There is no CVA tenderness. Genitourinary: deferred Musculoskeletal: Nontender with normal range of motion in all extremities. No joint effusions.  No lower extremity tenderness nor edema. Neurologic:  Normal speech and language. No gross focal neurologic deficits are appreciated. Speech is normal.  Skin:  Skin is warm, dry and intact. No rash noted. Psychiatric: Mood and affect are normal. Speech and behavior are normal. Patient exhibits appropriate insight and judgment.  ____________________________________________    LABS (pertinent positives/negatives)  Labs Reviewed  BASIC METABOLIC PANEL - Abnormal; Notable for the following:    Glucose, Bld 105 (*)    BUN 23 (*)    Creatinine, Ser 1.23 (*)    GFR calc non Af Amer 45 (*)    GFR calc Af Amer 52 (*)    All other components within normal limits  CBC - Abnormal; Notable for the following:    WBC 11.7 (*)    All other components within normal limits  URINALYSIS COMPLETEWITH MICROSCOPIC ([facility] ONLY) - Abnormal; Notable for the following:    Color, Urine STRAW (*)    APPearance CLEAR (*)    Leukocytes, UA 1+ (*)    Squamous  Epithelial / LPF 0-5 (*)    All other components within normal limits  URINE CULTURE  TROPONIN I  TSH  HEMOGLOBIN A1C  TROPONIN I  TROPONIN I  TROPONIN I     ____________________________________________   EKG  ED ECG REPORT I, Gurtha Picker, [place], the attending physician, personally viewed and interpreted this ECG.   Date: 05/04/2015  EKG Time: 10:53 PM  Rate: 68  Rhythm: Normal sinus rhythm  Axis: None  Intervals: Normal  ST&T Change: None   ____________________________________________       INITIAL IMPRESSION / ASSESSMENT AND PLAN / ED COURSE  Pertinent labs & imaging results that were available during my care of the patient were reviewed by me and considered in my medical decision making (see chart for details).  Patient took 281 mg aspirin before presentation emergency department as such 2 additional 81 mg aspirin was administered here. Initial EKG revealed no evidence of ST segment elevation  ____________________________________________   FINAL CLINICAL IMPRESSION(S) / ED DIAGNOSES  Final diagnoses:  None  Chest pain    Gregor Hams, MD 05/06/15 [phone]

## 2015-05-03 NOTE — ED Notes (Addendum)
Patient presents with c/o retrosternal CP that began while patient was "just sitting" around 2100 tonight. Patient denies N/V, SOB, and diaphoresis. Patient with LUE heaviness. PMH significant for "2 leaking values"; (+) strong familial h/o heart disease. Patient reporting HTN tonight; SBP > 200 - reports taking 0.'25mg'$  Xanax - "I normally do this and it seems to bring it down."

## 2015-05-03 NOTE — Telephone Encounter (Signed)
Pt states that this was already addressed & her medication has already been picked up.

## 2015-05-04 ENCOUNTER — Telehealth: Payer: Self-pay | Admitting: Internal Medicine

## 2015-05-04 DIAGNOSIS — R079 Chest pain, unspecified: Secondary | ICD-10-CM | POA: Diagnosis present

## 2015-05-04 LAB — URINALYSIS COMPLETE WITH MICROSCOPIC (ARMC ONLY)
Bacteria, UA: NONE SEEN
Bilirubin Urine: NEGATIVE
Glucose, UA: NEGATIVE mg/dL
Hgb urine dipstick: NEGATIVE
Ketones, ur: NEGATIVE mg/dL
Nitrite: NEGATIVE
Protein, ur: NEGATIVE mg/dL
Specific Gravity, Urine: 1.005 (ref 1.005–1.030)
pH: 8 (ref 5.0–8.0)

## 2015-05-04 LAB — BASIC METABOLIC PANEL
Anion gap: 6 (ref 5–15)
BUN: 23 mg/dL — ABNORMAL HIGH (ref 6–20)
CALCIUM: 9.5 mg/dL (ref 8.9–10.3)
CO2: 29 mmol/L (ref 22–32)
CREATININE: 1.23 mg/dL — AB (ref 0.44–1.00)
Chloride: 105 mmol/L (ref 101–111)
GFR, EST AFRICAN AMERICAN: 52 mL/min — AB (ref >60)
GFR, EST NON AFRICAN AMERICAN: 45 mL/min — AB (ref >60)
Glucose, Bld: 105 mg/dL — ABNORMAL HIGH (ref 65–99)
Potassium: 4.1 mmol/L (ref 3.5–5.1)
SODIUM: 140 mmol/L (ref 135–145)

## 2015-05-04 LAB — TROPONIN I
Troponin I: <0.03 ng/mL (ref <0.031)
Troponin I: <0.03 ng/mL (ref <0.031)
Troponin I: <0.03 ng/mL (ref <0.031)

## 2015-05-04 LAB — HEMOGLOBIN A1C: Hgb A1c MFr Bld: 5.9 % (ref 4.0–6.0)

## 2015-05-04 LAB — TSH: TSH: 3.393 u[IU]/mL (ref 0.350–4.500)

## 2015-05-04 MED ORDER — ISOSORBIDE DINITRATE 10 MG PO TABS
30.0000 mg | ORAL_TABLET | Freq: Every day | ORAL | Status: DC
  Administered 2015-05-04: 30 mg via ORAL
  Filled 2015-05-04: qty 3

## 2015-05-04 MED ORDER — ASPIRIN EC 81 MG PO TBEC
81.0000 mg | DELAYED_RELEASE_TABLET | Freq: Every day | ORAL | Status: DC
  Administered 2015-05-04: 81 mg via ORAL
  Filled 2015-05-04: qty 1

## 2015-05-04 MED ORDER — SODIUM CHLORIDE 0.9 % IJ SOLN: 3.0000 mL | Freq: Two times a day (BID) | INTRAMUSCULAR | Status: DC

## 2015-05-04 MED ORDER — ASPIRIN 81 MG PO CHEW
CHEWABLE_TABLET | ORAL | Status: AC
  Administered 2015-05-04: 81 mg
  Filled 2015-05-04: qty 2

## 2015-05-04 MED ORDER — TRAMADOL HCL 50 MG PO TABS: 50.0000 mg | ORAL_TABLET | Freq: Three times a day (TID) | ORAL | Status: DC | PRN

## 2015-05-04 MED ORDER — ISOSORBIDE MONONITRATE ER 30 MG PO TB24: 30.0000 mg | ORAL_TABLET | Freq: Every day | ORAL | Status: DC

## 2015-05-04 MED ORDER — ALBUTEROL SULFATE (2.5 MG/3ML) 0.083% IN NEBU: 3.0000 mL | INHALATION_SOLUTION | RESPIRATORY_TRACT | Status: DC | PRN

## 2015-05-04 MED ORDER — PANTOPRAZOLE SODIUM 40 MG PO TBEC
40.0000 mg | DELAYED_RELEASE_TABLET | Freq: Every day | ORAL | Status: DC
  Administered 2015-05-04: 40 mg via ORAL
  Filled 2015-05-04: qty 1

## 2015-05-04 MED ORDER — DOXYCYCLINE HYCLATE 100 MG PO TABS
100.0000 mg | ORAL_TABLET | Freq: Two times a day (BID) | ORAL | Status: DC
  Administered 2015-05-04 (×2): 100 mg via ORAL
  Filled 2015-05-04 (×2): qty 1

## 2015-05-04 MED ORDER — SODIUM CHLORIDE 0.9 % IV SOLN
INTRAVENOUS | Status: DC
  Administered 2015-05-04 (×2): via INTRAVENOUS

## 2015-05-04 MED ORDER — TRIAMTERENE-HCTZ 37.5-25 MG PO TABS
1.0000 | ORAL_TABLET | Freq: Every day | ORAL | Status: DC
  Administered 2015-05-04: 1 via ORAL
  Filled 2015-05-04: qty 1

## 2015-05-04 MED ORDER — TRAZODONE HCL 50 MG PO TABS: 50.0000 mg | ORAL_TABLET | Freq: Every evening | ORAL | Status: DC | PRN

## 2015-05-04 MED ORDER — HEPARIN SODIUM (PORCINE) 5000 UNIT/ML IJ SOLN
5000.0000 [IU] | Freq: Three times a day (TID) | INTRAMUSCULAR | Status: DC
  Administered 2015-05-04: 5000 [IU] via SUBCUTANEOUS
  Filled 2015-05-04 (×2): qty 1

## 2015-05-04 MED ORDER — ALPRAZOLAM 0.5 MG PO TABS
0.5000 mg | ORAL_TABLET | Freq: Every day | ORAL | Status: DC | PRN
  Administered 2015-05-04: 0.5 mg via ORAL
  Filled 2015-05-04: qty 1

## 2015-05-04 MED ORDER — ACETAMINOPHEN 325 MG PO TABS: 650.0000 mg | ORAL_TABLET | Freq: Four times a day (QID) | ORAL | Status: DC | PRN

## 2015-05-04 MED ORDER — ONDANSETRON HCL 4 MG PO TABS: 4.0000 mg | ORAL_TABLET | Freq: Four times a day (QID) | ORAL | Status: DC | PRN

## 2015-05-04 MED ORDER — ACETAMINOPHEN 650 MG RE SUPP: 650.0000 mg | Freq: Four times a day (QID) | RECTAL | Status: DC | PRN

## 2015-05-04 MED ORDER — ASPIRIN EC 325 MG PO TBEC: 325.0000 mg | DELAYED_RELEASE_TABLET | Freq: Every day | ORAL | Status: DC

## 2015-05-04 MED ORDER — CYCLOBENZAPRINE HCL 10 MG PO TABS: 5.0000 mg | ORAL_TABLET | Freq: Three times a day (TID) | ORAL | Status: DC | PRN

## 2015-05-04 MED ORDER — MORPHINE SULFATE (PF) 2 MG/ML IV SOLN: 2.0000 mg | INTRAVENOUS | Status: DC | PRN

## 2015-05-04 MED ORDER — ONDANSETRON HCL 4 MG/2ML IJ SOLN: 4.0000 mg | Freq: Four times a day (QID) | INTRAMUSCULAR | Status: DC | PRN

## 2015-05-04 MED ORDER — FLUTICASONE PROPIONATE 50 MCG/ACT NA SUSP
1.0000 | Freq: Every day | NASAL | Status: DC
  Administered 2015-05-04: 1 via NASAL
  Filled 2015-05-04: qty 16

## 2015-05-04 MED ORDER — ADULT MULTIVITAMIN W/MINERALS CH
1.0000 | ORAL_TABLET | Freq: Every day | ORAL | Status: DC
  Administered 2015-05-04: 1 via ORAL
  Filled 2015-05-04: qty 1

## 2015-05-04 MED ORDER — POTASSIUM CHLORIDE CRYS ER 20 MEQ PO TBCR
20.0000 meq | EXTENDED_RELEASE_TABLET | Freq: Two times a day (BID) | ORAL | Status: DC
  Administered 2015-05-04: 20 meq via ORAL
  Filled 2015-05-04: qty 1

## 2015-05-04 NOTE — Discharge Instructions (Signed)
Angina Pectoris Angina pectoris is a very bad feeling in the chest, neck, or arm. Your doctor may call it angina. There are four types of angina. Angina is caused by a lack of blood in the middle and thickest layer of the heart wall (myocardium). Angina may feel like a crushing or squeezing pain in the chest. It may feel like tightness or heavy pressure in the chest. Some people say it feels like gas, heartburn, or indigestion. Some people have symptoms other than pain. These include:  Shortness of breath.  Cold sweats.  Feeling sick to your stomach (nausea).  Feeling light-headed. Many women have chest discomfort and some of the other symptoms. However, women often have different symptoms, such as:  Feeling tired (fatigue).  Feeling nervous for no reason.  Feeling weak for no reason.  Dizziness or fainting. Women may have angina without any symptoms. HOME CARE  Take medicines only as told by your doctor.  Take care of other health issues as told by your doctor. These include:  High blood pressure (hypertension).  Diabetes.  Follow a heart-healthy diet. Your doctor can help you to choose healthy food options and make changes.  Talk to your doctor to learn more about healthy cooking methods and use them. These include:  Roasting.  Grilling.  Broiling.  Baking.  Poaching.  Steaming.  Stir-frying.  Follow an exercise program approved by your doctor.  Keep a healthy weight. Lose weight as told by your doctor.  Rest when you are tired.  Learn to manage stress.  Do not use any tobacco, such as cigarettes, chewing tobacco, or electronic cigarettes. If you need help quitting, ask your doctor.  If you drink alcohol, and your doctor says it is okay, limit yourself to no more than 1 drink per day. One drink equals 12 ounces of beer, 5 ounces of wine, or 1 ounces of hard liquor.  Stop illegal drug use.  Keep all follow-up visits as told by your doctor. This is  important. Do not take these medicines unless your doctor says that you can:  Nonsteroidal anti-inflammatory drugs (NSAIDs). These include:  Ibuprofen.  Naproxen.  Celecoxib.  Vitamin supplements that have vitamin A, vitamin E, or both.  Hormone therapy that contains estrogen with or without progestin. GET HELP RIGHT AWAY IF:  You have pain in your chest, neck, arm, jaw, stomach, or back that:  Lasts more than a few minutes.  Comes back.  Does not get better after you take medicine under your tongue (sublingual nitroglycerin).  You have any of these symptoms for no reason:  Gas, heartburn, or indigestion.  Sweating a lot.  Shortness of breath or trouble breathing.  Feeling sick to your stomach or throwing up.  Feeling more tired than usual.  Feeling nervous or worrying more than usual.  Feeling weak.  Diarrhea.  You are suddenly dizzy or light-headed.  You faint or pass out. These symptoms may be an emergency. Do not wait to see if the symptoms will go away. Get medical help right away. Call your local emergency services (911 in the [place] Do not drive yourself to the hospital.   This information is not intended to replace advice given to you by your health care provider. Make sure you discuss any questions you have with your health care provider.   Document Released: 11/14/2007 Document Revised: 10/12/2014 Document Reviewed: 09/29/2013 Elsevier Interactive Patient Education [employer].

## 2015-05-04 NOTE — ED Notes (Addendum)
Pt reports episode of chest pain of chest pain at 2100 tonight.  Pt states pain was in center of chest.  nonradiaiting pain.  Denies sob.  No chest pain on arrival to treatment room now.  No n/v/d   Pa alert.  md at bedside.  nsr on monitor.  Skin warm, and dry.

## 2015-05-04 NOTE — Progress Notes (Signed)
Discharge instructions given to pt and family with verbal acknowledgment of understanding. Prescription e-submitted and follow-up apts scheduled. IV and tele removed. Pt dressed and ready to be d/c'd after dinner arrives.

## 2015-05-04 NOTE — ED Notes (Signed)
Report called to 2nd floor rn.

## 2015-05-04 NOTE — ED Notes (Signed)
Pt states no chest pain or sob.  Alert.  nsr on monitor.  Skin warm and dry.  Iv in place.  Family with pt.

## 2015-05-04 NOTE — Progress Notes (Signed)
Patient arrived to 2A Room 241. Patient denies pain and all questions answered. Patient oriented to unit and Fall Safety Plan signed. Skin assessment completed with Jerene Pitch RN and skin noted to be intact. Nursing staff will continue to monitor. Earleen Reaper, RN

## 2015-05-04 NOTE — Care Management Obs Status (Signed)
[facility] NOTIFICATION   Patient Details  Name: Victoria Hanson MRN: [id number] Date of Birth: April 25, 1951   Medicare Observation Status Notification Given:  Yes sent to HIM    Beverly Sessions, RN 05/04/2015, 1:35 PM

## 2015-05-04 NOTE — Telephone Encounter (Signed)
FYI,Sandra from [facility]  Pt is being discharged from the Hospital today. Diagnosis is Chest pain and High Blood Pressure. No avail appt to sch. Please let me know where to sch once spoken to Dr Nicki Reaper. Thank You!

## 2015-05-04 NOTE — H&P (Addendum)
Victoria Hanson is an 64 y.o. female.   Chief Complaint: Chest pain HPI: The patient presents emergency department complaining of chest pain. It began at rest and lasted for 2 hours albeit intermittently. The patient points to her mid epigastric region and says the pain remained substernal. She denies associated nausea, vomiting or diaphoresis. She admits that her left arm felt heavy but denies pain or numbness. She took 2 baby aspirin at home and was given more aspirin in the emergency department. The pain has not returned since that time. She also reports that her systolic blood pressure was greater than 170. She took Ativan which brought her pressure down to 140.  Past medical history is significant for hypertension and TIA despite aspirin use. Due to her symptoms and risk factors emergency department staff called for admission.  Past Medical History  Diagnosis Date  . Colon cancer ([place]) 1999    Pt had partial colectomy. Did develop lung metastasis. Had right upper lobectomy in 01/2007 then had associated chemotherapy.  . Hypercholesterolemia   . Hypertension   . Depression   . Nephrolithiasis   . Elevated transaminase level     ? NASH  . GERD (gastroesophageal reflux disease)   . Holter monitor, abnormal 12/2009    NSR, rare PACs and PVCs  . Echocardiogram abnormal 02/2010    EF 55-60%, mild LAE, no regional wall motion abnormalities  . Lung cancer ([place])     S/P resection (lobectomy - right)  . COPD (chronic obstructive pulmonary disease) ([place])   . Anxiety   . DDD (degenerative disc disease), lumbosacral   . Hematuria   . Leaky heart valve     Past Surgical History  Procedure Laterality Date  . Colon surgery      Colon cancer, removed part of large colon  . Lobectomy      Right lung, upper lobe right side removed  . Cardiac catheterization  2003    negative as per pt report  . Cholecystectomy  2000  . Tubal ligation  1980  . Total abdominal hysterectomy      abnormal  bleeding  . Breast biopsy  10/07/2009    Right breast  . Colonoscopy N/A 04/25/2015    Procedure: COLONOSCOPY;  Surgeon: Manya Silvas, MD;  Location: [facility] ENDOSCOPY;  Service: Endoscopy;  Laterality: N/A;    Family History  Problem Relation Age of Onset  . Coronary artery disease Mother   . Heart attack Father 56    MI  . Cancer Father     lung  . Stroke Father   . Hypertension Father   . Sudden death Brother   . Mental illness Mother   . Mental illness Brother   . Diabetes Mother    Social History:  reports that she has never smoked. She has never used smokeless tobacco. She reports that she drinks about 0.6 oz of alcohol per week. She reports that she does not use illicit drugs.  Allergies:  Allergies  Allergen Reactions  . Antihistamines, Diphenhydramine-Type     Other reaction(s): Other (See Comments) She does not know which antihistamine she has an allergic reaction to.  . Aspirin     Other reaction(s): Other (See Comments) Makes her sick on the stomach.  Of note, she can take ibuprofen.  . Ceftin [Cefuroxime Axetil] Other (See Comments)    Chest pains  . Celecoxib     Other reaction(s): Blood Disorder GI bleeding.  . Ciprofloxacin   . Cymbalta [Duloxetine  Hcl]   . Diphenhydramine Other (See Comments)  . Escitalopram Other (See Comments)  . Lexapro [Escitalopram Oxalate]     Difficulty with speech  . Macrobid [Nitrofurantoin Macrocrystal]   . Metronidazole Other (See Comments)    Other reaction(s): Unknown  . Zoloft [Sertraline Hcl]     Prior to Admission medications   Medication Sig Start Date End Date Taking? Authorizing Provider  ALPRAZolam Duanne Moron) 0.5 MG tablet Take 1 tablet (0.5 mg total) by mouth daily as needed for anxiety. 11/12/14   Einar Pheasant, MD  aspirin 81 MG tablet Take 81 mg by mouth daily.    Historical Provider, MD  cyclobenzaprine (FLEXERIL) 5 MG tablet Take 1 tablet (5 mg total) by mouth 3 (three) times daily as needed for muscle  spasms. 04/13/15   Coral Spikes, DO  doxycycline (VIBRA-TABS) 100 MG tablet Take 1 tablet (100 mg total) by mouth 2 (two) times daily. 04/08/15   Jayce G Cook, DO  mometasone (NASONEX) 50 MCG/ACT nasal spray Place 2 sprays into the nose daily. 04/08/15   Coral Spikes, DO  Multiple Vitamin (MULTIVITAMIN) tablet Take 1 tablet by mouth daily.    Historical Provider, MD  omeprazole (PRILOSEC) 20 MG capsule TAKE ONE (1) CAPSULE EACH DAY 03/25/15   Einar Pheasant, MD  potassium chloride SA (K-DUR,KLOR-CON) 20 MEQ tablet TAKE ONE TABLET TWICE DAILY 11/11/14   Einar Pheasant, MD  PROAIR HFA 108 (90 BASE) MCG/ACT inhaler INHALE TWO PUFFS INTO THE LUNGS EVERY 6 HOURS AS NEEDED FOR WHEEZING 12/28/14   Einar Pheasant, MD  traMADol (ULTRAM) 50 MG tablet Take 1 tablet (50 mg total) by mouth every 8 (eight) hours as needed. 04/13/15   Coral Spikes, DO  traZODone (DESYREL) 50 MG tablet Take 2 1/2 tablet q hs 04/25/15   Einar Pheasant, MD  triamterene-hydrochlorothiazide (MAXZIDE-25) 37.5-25 MG per tablet TAKE ONE (1) TABLET EACH DAY 04/13/14   Einar Pheasant, MD     Results for orders placed or performed during the hospital encounter of 05/03/15 (from the past 48 hour(s))  Basic metabolic panel     Status: Abnormal   Collection Time: 05/03/15 11:05 PM  Result Value Ref Range   Sodium 140 135 - 145 mmol/L   Potassium 4.1 3.5 - 5.1 mmol/L   Chloride 105 101 - 111 mmol/L   CO2 29 22 - 32 mmol/L   Glucose, Bld 105 (H) 65 - 99 mg/dL   BUN 23 (H) 6 - 20 mg/dL   Creatinine, Ser 1.23 (H) 0.44 - 1.00 mg/dL   Calcium 9.5 8.9 - 10.3 mg/dL   GFR calc non Af Amer 45 (L) >60 mL/min   GFR calc Af Amer 52 (L) >60 mL/min    Comment: (NOTE) The eGFR has been calculated using the CKD EPI equation. This calculation has not been validated in all clinical situations. eGFR's persistently <60 mL/min signify possible Chronic Kidney Disease.    Anion gap 6 5 - 15  CBC     Status: Abnormal   Collection Time: 05/03/15 11:05 PM   Result Value Ref Range   WBC 11.7 (H) 3.6 - 11.0 K/uL   RBC 4.34 3.80 - 5.20 MIL/uL   Hemoglobin 12.0 12.0 - 16.0 g/dL   HCT 36.9 35.0 - 47.0 %   MCV 85.1 80.0 - 100.0 fL   MCH 27.7 26.0 - 34.0 pg   MCHC 32.6 32.0 - 36.0 g/dL   RDW 13.3 11.5 - 14.5 %   Platelets 239 150 -  440 K/uL  Troponin I     Status: None   Collection Time: 05/03/15 11:05 PM  Result Value Ref Range   Troponin I <0.03 <0.031 ng/mL    Comment:        NO INDICATION OF MYOCARDIAL INJURY.    No results found.  Review of Systems  Constitutional: Negative for fever and chills.  HENT: Negative for sore throat and tinnitus.   Eyes: Negative for blurred vision and redness.  Respiratory: Negative for cough and shortness of breath.   Cardiovascular: Positive for chest pain. Negative for palpitations, orthopnea and PND.  Gastrointestinal: Negative for nausea, vomiting, abdominal pain and diarrhea.  Genitourinary: Positive for dysuria and frequency. Negative for urgency.  Musculoskeletal: Negative for myalgias and joint pain.  Skin: Negative for rash.       No lesions  Neurological: Negative for speech change, focal weakness and weakness.  Endo/Heme/Allergies: Does not bruise/bleed easily.       No temperature intolerance  Psychiatric/Behavioral: Negative for depression and suicidal ideas.    Blood pressure 141/72, pulse 64, temperature 98 F (36.7 C), temperature source Oral, resp. rate 17, height $RemoveBe'5\' 4"'EePGcgGeW$  (1.626 m), weight 90.719 kg (200 lb), SpO2 98 %. Physical Exam  Vitals reviewed. Constitutional: She is oriented to person, place, and time. She appears well-developed and well-nourished. No distress.  HENT:  Head: Normocephalic and atraumatic.  Mouth/Throat: Oropharynx is clear and moist.  Eyes: Conjunctivae and EOM are normal. Pupils are equal, round, and reactive to light. No scleral icterus.  Neck: Normal range of motion. Neck supple. No JVD present. No tracheal deviation present. No thyromegaly present.   Cardiovascular: Normal rate, regular rhythm and normal heart sounds.  Exam reveals no gallop and no friction rub.   No murmur heard. Respiratory: Effort normal and breath sounds normal.  GI: Soft. Bowel sounds are normal. She exhibits no distension. There is no tenderness.  Genitourinary:  Deferred  Musculoskeletal: Normal range of motion. She exhibits no edema.  Lymphadenopathy:    She has no cervical adenopathy.  Neurological: She is alert and oriented to person, place, and time. No cranial nerve deficit. She exhibits normal muscle tone.  Skin: Skin is warm and dry. No rash noted. No erythema.  Psychiatric: She has a normal mood and affect. Her behavior is normal. Judgment and thought content normal.     Assessment/Plan This is a 64 year old Caucasian female admitted for chest pain. 1. Chest pain: Atypical as intermittent for a prolonged period of time. However the patient's risk factors for heart disease. Thus we will monitor telemetry overnight and check cardiac enzymes. Initial troponin is negative. EKG without ischemic changes. Cardiology consult ordered. 2. Hypertension:Continue triamterene with hydrochlorothiazide 3. Chronic kidney disease: Stage III. Hydrate with intravenous fluid and avoid nephrotoxic agents. 4. Urinary symptoms: Dysuria with mild leukocytosis presumably urinary tract infection. We'll obtain urine sample and empirically treat for infection. 5. DVT prophylaxis: Heparin 6. GI prophylaxis: None The patient is a full code. Time spent on admission orders and patient care approximately 45 minutes  Harrie Foreman 05/04/2015, 1:51 AM

## 2015-05-04 NOTE — Telephone Encounter (Signed)
I apologize I sent this to Lavella Lemons it should have gone to you. See note below. Thank You!

## 2015-05-04 NOTE — Progress Notes (Signed)
Victoria Hanson is a 64 y.o. female  [id number]  [facility]  Reason for Consultation: chest pain   HPI:64 YOWF presented to ER with chest pain repeatedly anginal type. No furher chest pains, and BP was elevated at time of chest pains.   Review of Systems: NO PND/Orthopnea   Past Medical History  Diagnosis Date  . Colon cancer ([place]) 1999    Pt had partial colectomy. Did develop lung metastasis. Had right upper lobectomy in 01/2007 then had associated chemotherapy.  . Hypercholesterolemia   . Hypertension   . Depression   . Nephrolithiasis   . Elevated transaminase level     ? NASH  . GERD (gastroesophageal reflux disease)   . Holter monitor, abnormal 12/2009    NSR, rare PACs and PVCs  . Echocardiogram abnormal 02/2010    EF 55-60%, mild LAE, no regional wall motion abnormalities  . Lung cancer ([place])     S/P resection (lobectomy - right)  . COPD (chronic obstructive pulmonary disease) ([place])   . Anxiety   . DDD (degenerative disc disease), lumbosacral   . Hematuria   . Leaky heart valve     Medications Prior to Admission  Medication Sig Dispense Refill  . ALPRAZolam (XANAX) 0.5 MG tablet Take 1 tablet (0.5 mg total) by mouth daily as needed for anxiety. 60 tablet 0  . aspirin 81 MG tablet Take 81 mg by mouth daily.    . cyclobenzaprine (FLEXERIL) 5 MG tablet Take 1 tablet (5 mg total) by mouth 3 (three) times daily as needed for muscle spasms. 30 tablet 0  . doxycycline (VIBRA-TABS) 100 MG tablet Take 1 tablet (100 mg total) by mouth 2 (two) times daily. 20 tablet 0  . mometasone (NASONEX) 50 MCG/ACT nasal spray Place 2 sprays into the nose daily. 17 g 6  . Multiple Vitamin (MULTIVITAMIN) tablet Take 1 tablet by mouth daily.    Marland Kitchen omeprazole (PRILOSEC) 20 MG capsule TAKE ONE (1) CAPSULE EACH DAY 30 capsule 4  . potassium chloride SA (K-DUR,KLOR-CON) 20 MEQ tablet TAKE ONE TABLET TWICE DAILY 60 tablet 6  . PROAIR HFA 108 (90 BASE) MCG/ACT inhaler  INHALE TWO PUFFS INTO THE LUNGS EVERY 6 HOURS AS NEEDED FOR WHEEZING 8.5 g 6  . traMADol (ULTRAM) 50 MG tablet Take 1 tablet (50 mg total) by mouth every 8 (eight) hours as needed. 30 tablet 0  . traZODone (DESYREL) 50 MG tablet Take 2 1/2 tablet q hs 90 tablet 1  . triamterene-hydrochlorothiazide (MAXZIDE-25) 37.5-25 MG per tablet TAKE ONE (1) TABLET EACH DAY 30 tablet 6     . aspirin EC  81 mg Oral Daily  . doxycycline  100 mg Oral BID WC  . fluticasone  1 spray Each Nare Daily  . heparin  5,000 Units Subcutaneous 3 times per day  . isosorbide dinitrate  30 mg Oral Daily  . multivitamin with minerals  1 tablet Oral Q supper  . pantoprazole  40 mg Oral QAC breakfast  . potassium chloride SA  20 mEq Oral BID  . sodium chloride  3 mL Intravenous Q12H  . triamterene-hydrochlorothiazide  1 tablet Oral Daily    Infusions: . sodium chloride 125 mL/hr at 05/04/15 1044    Allergies  Allergen Reactions  . Antihistamines, Diphenhydramine-Type     Other reaction(s): Other (See Comments) She does not know which antihistamine she has an allergic reaction to.  . Aspirin     Other reaction(s): Other (See Comments) Makes  her sick on the stomach.  Of note, she can take ibuprofen.  . Ceftin [Cefuroxime Axetil] Other (See Comments)    Chest pains  . Celecoxib     Other reaction(s): Blood Disorder GI bleeding.  . Ciprofloxacin   . Cymbalta [Duloxetine Hcl]   . Diphenhydramine Other (See Comments)  . Escitalopram Other (See Comments)  . Lexapro [Escitalopram Oxalate]     Difficulty with speech  . Macrobid [Nitrofurantoin Macrocrystal]   . Metronidazole Other (See Comments)    Other reaction(s): Unknown  . Zoloft [Sertraline Hcl]     Social History   Social History  . Marital Status: Widowed    Spouse Name: N/A  . Number of Children: N/A  . Years of Education: N/A   Occupational History  . Not on file.   Social History Main Topics  . Smoking status: Never Smoker   . Smokeless  tobacco: Never Used  . Alcohol Use: 0.6 oz/week    0 Standard drinks or equivalent, 1 Glasses of wine per week  . Drug Use: No  . Sexual Activity: Not on file   Other Topics Concern  . Not on file   Social History Narrative   Widowed   Disabled   Gets regular exercise, walks and plays volleyball    Family History  Problem Relation Age of Onset  . Coronary artery disease Mother   . Heart attack Father 36    MI  . Cancer Father     lung  . Stroke Father   . Hypertension Father   . Sudden death Brother   . Mental illness Mother   . Mental illness Brother   . Diabetes Mother     PHYSICAL EXAM: Filed Vitals:   05/04/15 0743 05/04/15 1133  BP: 140/78 128/73  Pulse: 64 66  Temp:  97.7 F (36.5 C)  Resp:  17     Intake/Output Summary (Last 24 hours) at 05/04/15 1402 Last data filed at 05/04/15 1145  Gross per 24 hour  Intake    120 ml  Output   1100 ml  Net   -980 ml    General:  Well appearing. No respiratory difficulty HEENT: normal Neck: supple. no JVD. Carotids 2+ bilat; no bruits. No lymphadenopathy or thryomegaly appreciated. Cor: PMI nondisplaced. Regular rate & rhythm. No rubs, gallops or murmurs. Lungs: clear Abdomen: soft, nontender, nondistended. No hepatosplenomegaly. No bruits or masses. Good bowel sounds. Extremities: no cyanosis, clubbing, rash, edema Neuro: alert & oriented x 3, cranial nerves grossly intact. moves all 4 extremities w/o difficulty. Affect pleasant.  ECG: NSR 68/min ICRBB, non-specific st and t changes  Results for orders placed or performed during the hospital encounter of 05/03/15 (from the past 24 hour(s))  Basic metabolic panel     Status: Abnormal   Collection Time: 05/03/15 11:05 PM  Result Value Ref Range   Sodium 140 135 - 145 mmol/L   Potassium 4.1 3.5 - 5.1 mmol/L   Chloride 105 101 - 111 mmol/L   CO2 29 22 - 32 mmol/L   Glucose, Bld 105 (H) 65 - 99 mg/dL   BUN 23 (H) 6 - 20 mg/dL   Creatinine, Ser 1.23 (H) 0.44 -  1.00 mg/dL   Calcium 9.5 8.9 - 10.3 mg/dL   GFR calc non Af Amer 45 (L) >60 mL/min   GFR calc Af Amer 52 (L) >60 mL/min   Anion gap 6 5 - 15  CBC     Status: Abnormal   Collection  Time: 05/03/15 11:05 PM  Result Value Ref Range   WBC 11.7 (H) 3.6 - 11.0 K/uL   RBC 4.34 3.80 - 5.20 MIL/uL   Hemoglobin 12.0 12.0 - 16.0 g/dL   HCT 36.9 35.0 - 47.0 %   MCV 85.1 80.0 - 100.0 fL   MCH 27.7 26.0 - 34.0 pg   MCHC 32.6 32.0 - 36.0 g/dL   RDW 13.3 11.5 - 14.5 %   Platelets 239 150 - 440 K/uL  Troponin I     Status: None   Collection Time: 05/03/15 11:05 PM  Result Value Ref Range   Troponin I <0.03 <0.031 ng/mL  TSH     Status: None   Collection Time: 05/04/15  3:09 AM  Result Value Ref Range   TSH 3.393 0.350 - 4.500 uIU/mL  Troponin I     Status: None   Collection Time: 05/04/15  3:09 AM  Result Value Ref Range   Troponin I <0.03 <0.031 ng/mL  Troponin I     Status: None   Collection Time: 05/04/15  8:31 AM  Result Value Ref Range   Troponin I <0.03 <0.031 ng/mL  Urinalysis complete, with microscopic ([facility] only)     Status: Abnormal   Collection Time: 05/04/15 11:20 AM  Result Value Ref Range   Color, Urine STRAW (A) YELLOW   APPearance CLEAR (A) CLEAR   Glucose, UA NEGATIVE NEGATIVE mg/dL   Bilirubin Urine NEGATIVE NEGATIVE   Ketones, ur NEGATIVE NEGATIVE mg/dL   Specific Gravity, Urine 1.005 1.005 - 1.030   Hgb urine dipstick NEGATIVE NEGATIVE   pH 8.0 5.0 - 8.0   Protein, ur NEGATIVE NEGATIVE mg/dL   Nitrite NEGATIVE NEGATIVE   Leukocytes, UA 1+ (A) NEGATIVE   RBC / HPF 0-5 0 - 5 RBC/hpf   WBC, UA 6-30 0 - 5 WBC/hpf   Bacteria, UA NONE SEEN NONE SEEN   Squamous Epithelial / LPF 0-5 (A) NONE SEEN   No results found.   ASSESSMENT AND PLAN: Anginal type chest pains, which have resolved now with cardiac enzymes negative and unremarkable EKG. May go home on isosrbide 30, asp 325 qd and f/u office this Friday at 9:30 am for w/u of CAD. Daxson Reffett A

## 2015-05-06 LAB — URINE CULTURE: Culture: >=100000

## 2015-05-07 NOTE — Discharge Summary (Signed)
[facility] [place] NAME: Victoria Hanson    MR#:  [id number]  DATE OF BIRTH:  June 23, 1950  DATE OF ADMISSION:  05/03/2015 ADMITTING PHYSICIAN: Harrie Foreman, MD  DATE OF DISCHARGE: 05/04/2015  4:58 PM  PRIMARY CARE PHYSICIAN: Einar Pheasant, MD    ADMISSION DIAGNOSIS:  chest pain, high BP  DISCHARGE DIAGNOSIS:  Active Problems:   Chest pain  SECONDARY DIAGNOSIS:   Past Medical History  Diagnosis Date  . Colon cancer ([place]) 1999    Pt had partial colectomy. Did develop lung metastasis. Had right upper lobectomy in 01/2007 then had associated chemotherapy.  . Hypercholesterolemia   . Hypertension   . Depression   . Nephrolithiasis   . Elevated transaminase level     ? NASH  . GERD (gastroesophageal reflux disease)   . Holter monitor, abnormal 12/2009    NSR, rare PACs and PVCs  . Echocardiogram abnormal 02/2010    EF 55-60%, mild LAE, no regional wall motion abnormalities  . Lung cancer ([place])     S/P resection (lobectomy - right)  . COPD (chronic obstructive pulmonary disease) ([place])   . Anxiety   . DDD (degenerative disc disease), lumbosacral   . Hematuria   . Leaky heart valve     HOSPITAL COURSE:  64 year old Caucasian female admitted for chest pain.  Please see Dr. Tinnie Gens dictated history and physical for further details.  She was ruled out with 3 negative serial troponins.  Cardiology consultation was obtained with Dr. Humphrey Rolls recommended outpatient Myoview which was scheduled for Friday.  On 05/06/2015 at 9:30 AM at his office.  She was in agreement with same.  She was being discharged home in stable condition as she did not have any further chest pain. DISCHARGE CONDITIONS:   stable CONSULTS OBTAINED:  Treatment Team:  Dionisio David, MD  DRUG ALLERGIES:   Allergies  Allergen Reactions  . Antihistamines, Diphenhydramine-Type     Other reaction(s): Other (See Comments) She does not know which  antihistamine she has an allergic reaction to.  . Aspirin     Other reaction(s): Other (See Comments) Makes her sick on the stomach.  Of note, she can take ibuprofen.  . Ceftin [Cefuroxime Axetil] Other (See Comments)    Chest pains  . Celecoxib     Other reaction(s): Blood Disorder GI bleeding.  . Ciprofloxacin   . Cymbalta [Duloxetine Hcl]   . Diphenhydramine Other (See Comments)  . Escitalopram Other (See Comments)  . Lexapro [Escitalopram Oxalate]     Difficulty with speech  . Macrobid [Nitrofurantoin Macrocrystal]   . Metronidazole Other (See Comments)    Other reaction(s): Unknown  . Zoloft [Sertraline Hcl]     DISCHARGE MEDICATIONS:   Discharge Medication List as of 05/04/2015  3:21 PM    START taking these medications   Details  aspirin EC 325 MG tablet Take 1 tablet (325 mg total) by mouth daily., Starting 05/04/2015, Until Discontinued, Normal    isosorbide mononitrate (IMDUR) 30 MG 24 hr tablet Take 1 tablet (30 mg total) by mouth daily., Starting 05/04/2015, Until Discontinued, Normal      CONTINUE these medications which have NOT CHANGED   Details  ALPRAZolam (XANAX) 0.5 MG tablet Take 1 tablet (0.5 mg total) by mouth daily as needed for anxiety., Starting 11/12/2014, Until Discontinued, Print    cyclobenzaprine (FLEXERIL) 5 MG tablet Take 1 tablet (5 mg total) by mouth 3 (three) times daily as needed for muscle  spasms., Starting 04/13/2015, Until Discontinued, Normal    doxycycline (VIBRA-TABS) 100 MG tablet Take 1 tablet (100 mg total) by mouth 2 (two) times daily., Starting 04/08/2015, Until Discontinued, Normal    mometasone (NASONEX) 50 MCG/ACT nasal spray Place 2 sprays into the nose daily., Starting 04/08/2015, Until Discontinued, Normal    Multiple Vitamin (MULTIVITAMIN) tablet Take 1 tablet by mouth daily., Until Discontinued, Historical Med    omeprazole (PRILOSEC) 20 MG capsule TAKE ONE (1) CAPSULE EACH DAY, Normal    potassium chloride SA  (K-DUR,KLOR-CON) 20 MEQ tablet TAKE ONE TABLET TWICE DAILY, Normal    PROAIR HFA 108 (90 BASE) MCG/ACT inhaler INHALE TWO PUFFS INTO THE LUNGS EVERY 6 HOURS AS NEEDED FOR WHEEZING, Normal    traMADol (ULTRAM) 50 MG tablet Take 1 tablet (50 mg total) by mouth every 8 (eight) hours as needed., Starting 04/13/2015, Until Discontinued, Print    traZODone (DESYREL) 50 MG tablet Take 2 1/2 tablet q hs, Normal    triamterene-hydrochlorothiazide (MAXZIDE-25) 37.5-25 MG per tablet TAKE ONE (1) TABLET EACH DAY, Normal      STOP taking these medications     aspirin 81 MG tablet        DISCHARGE INSTRUCTIONS:    DIET:  Cardiac diet  DISCHARGE CONDITION:  Good  ACTIVITY:  Activity as tolerated  OXYGEN:  Home Oxygen: No.   Oxygen Delivery: room air  DISCHARGE LOCATION:  home   If you experience worsening of your admission symptoms, develop shortness of breath, life threatening emergency, suicidal or homicidal thoughts you must seek medical attention immediately by calling 911 or calling your MD immediately  if symptoms less severe.  You Must read complete instructions/literature along with all the possible adverse reactions/side effects for all the Medicines you take and that have been prescribed to you. Take any new Medicines after you have completely understood and accpet all the possible adverse reactions/side effects.   Please note  You were cared for by a hospitalist during your hospital stay. If you have any questions about your discharge medications or the care you received while you were in the hospital after you are discharged, you can call the unit and asked to speak with the hospitalist on call if the hospitalist that took care of you is not available. Once you are discharged, your primary care physician will handle any further medical issues. Please note that NO REFILLS for any discharge medications will be authorized once you are discharged, as it is imperative that you  return to your primary care physician (or establish a relationship with a primary care physician if you do not have one) for your aftercare needs so that they can reassess your need for medications and monitor your lab values.    On the day of Discharge:   VITAL SIGNS:  Blood pressure 124/65, pulse 66, temperature 97.7 F (36.5 C), temperature source Oral, resp. rate 17, height '5\' 4"'$  (1.626 m), weight 89.177 kg (196 lb 9.6 oz), SpO2 99 %.  I/O:  No intake or output data in the 24 hours ending 05/07/15 1253  PHYSICAL EXAMINATION:  GENERAL:  64 y.o.-year-old patient lying in the bed with no acute distress.  EYES: Pupils equal, round, reactive to light and accommodation. No scleral icterus. Extraocular muscles intact.  HEENT: Head atraumatic, normocephalic. Oropharynx and nasopharynx clear.  NECK:  Supple, no jugular venous distention. No thyroid enlargement, no tenderness.  LUNGS: Normal breath sounds bilaterally, no wheezing, rales,rhonchi or crepitation. No use of accessory muscles of  respiration.  CARDIOVASCULAR: S1, S2 normal. No murmurs, rubs, or gallops.  ABDOMEN: Soft, non-tender, non-distended. Bowel sounds present. No organomegaly or mass.  EXTREMITIES: No pedal edema, cyanosis, or clubbing.  NEUROLOGIC: Cranial nerves II through XII are intact. Muscle strength 5/5 in all extremities. Sensation intact. Gait not checked.  PSYCHIATRIC: The patient is alert and oriented x 3.  SKIN: No obvious rash, lesion, or ulcer.   DATA REVIEW:   CBC  Recent Labs Lab 05/03/15 2305  WBC 11.7*  HGB 12.0  HCT 36.9  PLT 239    Chemistries   Recent Labs Lab 05/03/15 2305  NA 140  K 4.1  CL 105  CO2 29  GLUCOSE 105*  BUN 23*  CREATININE 1.23*  CALCIUM 9.5    Cardiac Enzymes  Recent Labs Lab 05/04/15 1414  [place] <0.03    Microbiology Results  Results for orders placed or performed during the hospital encounter of 05/03/15  Urine culture     Status: None    Collection Time: 05/04/15 11:20 AM  Result Value Ref Range Status   Specimen Description URINE, RANDOM  Final   Special Requests NONE  Final   Culture >=100,000 COLONIES/mL PROTEUS MIRABILIS  Final   Report Status 05/06/2015 FINAL  Final   Organism ID, Bacteria PROTEUS MIRABILIS  Final      Susceptibility   Proteus mirabilis - MIC*    AMPICILLIN <=2 SENSITIVE Sensitive     CEFTAZIDIME <=1 SENSITIVE Sensitive     CEFAZOLIN <=4 SENSITIVE Sensitive     CEFTRIAXONE <=1 SENSITIVE Sensitive     CIPROFLOXACIN <=0.25 SENSITIVE Sensitive     GENTAMICIN <=1 SENSITIVE Sensitive     IMIPENEM 1 SENSITIVE Sensitive     TRIMETH/SULFA <=20 SENSITIVE Sensitive     NITROFURANTOIN Value in next row Resistant      RESISTANT128    PIP/TAZO Value in next row Sensitive      SENSITIVE<=4    LEVOFLOXACIN Value in next row Sensitive      SENSITIVE<=0.12    * >=100,000 COLONIES/mL PROTEUS MIRABILIS    RADIOLOGY:  No results found.   Management plans discussed with the patient, family and they are in agreement.  CODE STATUS:  Advance Directive Documentation        Most Recent Value   Type of Advance Directive  Healthcare Power of Attorney   Pre-existing out of facility DNR order (yellow form or pink MOST form)     "MOST" Form in Place?        TOTAL TIME TAKING CARE OF THIS PATIENT: 55 minutes.    [facility], Jaxsyn Azam M.D on 05/07/2015 at 12:53 PM  Between 7am to 6pm - Pager - [phone]  After 6pm go to [email or web address] - password [email or web address] [facility]  Office  [phone]  CC: Primary care physician; Einar Pheasant, MD [facility] MD

## 2015-05-09 NOTE — Telephone Encounter (Signed)
Ok!! Cool Thank You!

## 2015-05-09 NOTE — Telephone Encounter (Signed)
Thank you! You did the right thing by sending this message to Team Lead, I was out of the office at this time.  Please continue to forward hospital follow up concerns to both she and I.  Appointment made.

## 2015-05-10 ENCOUNTER — Encounter: Payer: Self-pay | Admitting: Internal Medicine

## 2015-05-10 ENCOUNTER — Ambulatory Visit (INDEPENDENT_AMBULATORY_CARE_PROVIDER_SITE_OTHER): Payer: Medicare PPO | Admitting: Internal Medicine

## 2015-05-10 VITALS — BP 120/80 | HR 69 | Temp 97.7°F | Resp 17 | Ht 64.0 in | Wt 196.0 lb

## 2015-05-10 DIAGNOSIS — R079 Chest pain, unspecified: Secondary | ICD-10-CM

## 2015-05-10 DIAGNOSIS — F32A Depression, unspecified: Secondary | ICD-10-CM

## 2015-05-10 DIAGNOSIS — I10 Essential (primary) hypertension: Secondary | ICD-10-CM

## 2015-05-10 DIAGNOSIS — N289 Disorder of kidney and ureter, unspecified: Secondary | ICD-10-CM | POA: Diagnosis not present

## 2015-05-10 DIAGNOSIS — D72829 Elevated white blood cell count, unspecified: Secondary | ICD-10-CM | POA: Diagnosis not present

## 2015-05-10 DIAGNOSIS — K219 Gastro-esophageal reflux disease without esophagitis: Secondary | ICD-10-CM

## 2015-05-10 DIAGNOSIS — E7800 Pure hypercholesterolemia, unspecified: Secondary | ICD-10-CM

## 2015-05-10 DIAGNOSIS — R739 Hyperglycemia, unspecified: Secondary | ICD-10-CM

## 2015-05-10 DIAGNOSIS — R0981 Nasal congestion: Secondary | ICD-10-CM

## 2015-05-10 DIAGNOSIS — F329 Major depressive disorder, single episode, unspecified: Secondary | ICD-10-CM

## 2015-05-10 NOTE — Progress Notes (Addendum)
Patient ID: Tyshia J Bhullar, female   DOB: 01/06/1951, 64 y.o.   MRN: [id number]   Subjective:    Patient ID: Audre J Kugler, female    DOB: 04/30/1951, 64 y.o.   MRN: [id number]  HPI  Patient here for an ER follow up.  Was seen recently for chest pain.  See ER note for details.  She ruled out.  They had set her up to follow up with cardiology - outpatient stress test.  She cancelled her appt with cardiology on 05/06/15.  They had scheduled her to see Dr Kahn.  No chest pain now.  Breathing stable.  No nausea or vomiting.  Bowels stable.  Increased stress.  Taking xanax 1/2 bid.  Blood pressure was up in the ER.  She increased her triam/hctz to one whole tablet per day.  Increased this on her own.  Dr Kahn evaluated and recommended her start imdur - in the ER.  She is taking and tolerating.  No headache.  No urinary symptoms.  Some sinus congestion. White count slightly elevated in ER.  No fever.     Past Medical History  Diagnosis Date  . Colon cancer ([facility]) 1999    Pt had partial colectomy. Did develop lung metastasis. Had right upper lobectomy in 01/2007 then had associated chemotherapy.  . Hypercholesterolemia   . Hypertension   . Depression   . Nephrolithiasis   . Elevated transaminase level     ? NASH  . GERD (gastroesophageal reflux disease)   . Holter monitor, abnormal 12/2009    NSR, rare PACs and PVCs  . Echocardiogram abnormal 02/2010    EF 55-60%, mild LAE, no regional wall motion abnormalities  . Lung cancer ([facility])     S/P resection (lobectomy - right)  . COPD (chronic obstructive pulmonary disease) ([facility])   . Anxiety   . DDD (degenerative disc disease), lumbosacral   . Hematuria   . Leaky heart valve    Past Surgical History  Procedure Laterality Date  . Colon surgery      Colon cancer, removed part of large colon  . Lobectomy      Right lung, upper lobe right side removed  . Cardiac catheterization  2003    negative as per pt report  . Cholecystectomy  2000  .  Tubal ligation  1980  . Total abdominal hysterectomy      abnormal bleeding  . Breast biopsy  10/07/2009    Right breast  . Colonoscopy N/A 04/25/2015    Procedure: COLONOSCOPY;  Surgeon: Robert T Elliott, MD;  Location: [facility] ENDOSCOPY;  Service: Endoscopy;  Laterality: N/A;   Family History  Problem Relation Age of Onset  . Coronary artery disease Mother   . Heart attack Father 65    MI  . Cancer Father     lung  . Stroke Father   . Hypertension Father   . Sudden death Brother   . Mental illness Mother   . Mental illness Brother   . Diabetes Mother    Social History   Social History  . Marital Status: Widowed    Spouse Name: N/A  . Number of Children: N/A  . Years of Education: N/A   Social History Main Topics  . Smoking status: Never Smoker   . Smokeless tobacco: Never Used  . Alcohol Use: 0.6 oz/week    0 Standard drinks or equivalent, 1 Glasses of wine per week  . Drug Use: No  . Sexual Activity: Not Asked     Other Topics Concern  . None   Social History Narrative   Widowed   Disabled   Gets regular exercise, walks and plays volleyball    Outpatient Encounter Prescriptions as of 05/10/2015  Medication Sig  . ALPRAZolam (XANAX) 0.5 MG tablet Take 1 tablet (0.5 mg total) by mouth daily as needed for anxiety.  Marland Kitchen aspirin EC 325 MG tablet Take 1 tablet (325 mg total) by mouth daily.  . cyclobenzaprine (FLEXERIL) 5 MG tablet Take 1 tablet (5 mg total) by mouth 3 (three) times daily as needed for muscle spasms.  . isosorbide mononitrate (IMDUR) 30 MG 24 hr tablet Take 1 tablet (30 mg total) by mouth daily.  . mometasone (NASONEX) 50 MCG/ACT nasal spray Place 2 sprays into the nose daily.  . Multiple Vitamin (MULTIVITAMIN) tablet Take 1 tablet by mouth daily.  Marland Kitchen omeprazole (PRILOSEC) 20 MG capsule TAKE ONE (1) CAPSULE EACH DAY  . potassium chloride SA (K-DUR,KLOR-CON) 20 MEQ tablet TAKE ONE TABLET TWICE DAILY  . PROAIR HFA 108 (90 BASE) MCG/ACT inhaler INHALE TWO  PUFFS INTO THE LUNGS EVERY 6 HOURS AS NEEDED FOR WHEEZING  . traMADol (ULTRAM) 50 MG tablet Take 1 tablet (50 mg total) by mouth every 8 (eight) hours as needed.  . traZODone (DESYREL) 50 MG tablet Take 2 1/2 tablet q hs  . triamterene-hydrochlorothiazide (MAXZIDE-25) 37.5-25 MG per tablet TAKE ONE (1) TABLET EACH DAY  . [DISCONTINUED] doxycycline (VIBRA-TABS) 100 MG tablet Take 1 tablet (100 mg total) by mouth 2 (two) times daily.   No facility-administered encounter medications on file as of 05/10/2015.    Review of Systems  Constitutional: Negative for fever, appetite change and unexpected weight change.  HENT: Positive for congestion and postnasal drip.   Eyes: Negative for pain and discharge.  Respiratory: Negative for cough, chest tightness and shortness of breath.   Cardiovascular: Negative for chest pain, palpitations and leg swelling.  Gastrointestinal: Negative for nausea, vomiting, abdominal pain and diarrhea.  Genitourinary: Negative for dysuria and difficulty urinating.  Musculoskeletal: Negative for back pain and joint swelling.  Skin: Negative for color change and rash.  Neurological: Negative for dizziness, light-headedness and headaches.  Psychiatric/Behavioral: Negative for dysphoric mood and agitation.       Objective:     Blood pressure rechecked by me:  128/70  Physical Exam  Constitutional: She appears well-developed and well-nourished. No distress.  HENT:  Nose: Nose normal.  Mouth/Throat: Oropharynx is clear and moist.  Eyes: Conjunctivae are normal. Right eye exhibits no discharge. Left eye exhibits no discharge.  Neck: Neck supple. No thyromegaly present.  Cardiovascular: Normal rate and regular rhythm.   Pulmonary/Chest: Breath sounds normal. No respiratory distress. She has no wheezes.  Abdominal: Soft. Bowel sounds are normal. There is no tenderness.  Musculoskeletal: She exhibits no edema or tenderness.  Lymphadenopathy:    She has no cervical  adenopathy.  Skin: No rash noted. No erythema.  Psychiatric: She has a normal mood and affect. Her behavior is normal.    BP 120/80 mmHg  Pulse 69  Temp(Src) 97.7 F (36.5 C) (Oral)  Resp 17  Ht 5' 4" (1.626 m)  Wt 196 lb (88.905 kg)  BMI 33.63 kg/m2  SpO2 98% Wt Readings from Last 3 Encounters:  05/10/15 196 lb (88.905 kg)  05/04/15 196 lb 9.6 oz (89.177 kg)  04/25/15 198 lb (89.812 kg)     Lab Results  Component Value Date   WBC 11.3* 05/10/2015   HGB 12.9 05/10/2015  HCT 39.0 05/10/2015   PLT 265.0 05/10/2015   GLUCOSE 79 05/10/2015   CHOL 203* 03/14/2015   TRIG 126.0 03/14/2015   HDL 49.70 03/14/2015   LDLDIRECT 163.4 12/31/2012   LDLCALC 128* 03/14/2015   ALT 10 03/14/2015   AST 15 03/14/2015   NA 138 05/10/2015   K 4.1 05/10/2015   CL 101 05/10/2015   CREATININE 1.29* 05/10/2015   BUN 17 05/10/2015   CO2 26 05/10/2015   TSH 3.393 05/04/2015   INR 1.0 12/12/2012   HGBA1C 5.9 05/04/2015       Assessment & Plan:   Problem List Items Addressed This Visit    Chest pain    No pain now.  See ER note.  Cardiology recommended starting imdur and outpt evaluation.  She cancelled her appt.  Wants me to schedule [facility]ology at [facility].  Order for referral.        Relevant Orders   Ambulatory referral to Cardiology   Depression    On xanax.  Feels stable.  Follow.  Desires no further intervention.  Follow.        GERD (gastroesophageal reflux disease)    Upper symptoms controlled on omeprazole.  Follow.        Hypercholesterolemia    Low cholesterol diet and exercise.  Follow lipid panel and liver function tests.        Hyperglycemia    Low carb diet and exercise.  Follow met b and a1c.        Hypertension    Blood pressure elevated in ER.  Better today.  Have her decrease the triam/hctz to 1/2 tablet q day.  She is on imdur.  Follow pressures.  Follow metabolic panel.        Nasal congestion    Nasal congestion as outlined.  Do not feel abx  warranted.  Saline nasal spray, nasacort nasal spray and robitussin as directed.  Follow.        Renal insufficiency    Previous renal ultrasound revealed no significant abnormalities (03/2011).        Relevant Orders   Basic metabolic panel (Completed)    Other Visit Diagnoses    Leukocytosis    -  Primary    Relevant Orders    CBC with Differential/Platelet (Completed)        Einar Pheasant, MD

## 2015-05-10 NOTE — Progress Notes (Signed)
Pre-visit discussion using our clinic review tool. No additional management support is needed unless otherwise documented below in the visit note.  

## 2015-05-11 LAB — BASIC METABOLIC PANEL
BUN: 17 mg/dL (ref 6–23)
CHLORIDE: 101 meq/L (ref 96–112)
CO2: 26 mEq/L (ref 19–32)
Calcium: 10.3 mg/dL (ref 8.4–10.5)
Creatinine, Ser: 1.29 mg/dL — ABNORMAL HIGH (ref 0.40–1.20)
GFR: 44.02 mL/min — AB (ref >60.00)
Glucose, Bld: 79 mg/dL (ref 70–99)
Potassium: 4.1 mEq/L (ref 3.5–5.1)
Sodium: 138 mEq/L (ref 135–145)

## 2015-05-11 LAB — CBC WITH DIFFERENTIAL/PLATELET
BASOS ABS: 0 10*3/uL (ref 0.0–0.1)
Basophils Relative: 0.3 % (ref 0.0–3.0)
EOS PCT: 1.1 % (ref 0.0–5.0)
Eosinophils Absolute: 0.1 10*3/uL (ref 0.0–0.7)
HEMATOCRIT: 39 % (ref 36.0–46.0)
Hemoglobin: 12.9 g/dL (ref 12.0–15.0)
LYMPHS PCT: 21.5 % (ref 12.0–46.0)
Lymphs Abs: 2.4 10*3/uL (ref 0.7–4.0)
MCHC: 33.1 g/dL (ref 30.0–36.0)
MCV: 85.4 fl (ref 78.0–100.0)
MONOS PCT: 3.4 % (ref 3.0–12.0)
Monocytes Absolute: 0.4 10*3/uL (ref 0.1–1.0)
NEUTROS ABS: 8.3 10*3/uL — AB (ref 1.4–7.7)
Neutrophils Relative %: 73.7 % (ref 43.0–77.0)
Platelets: 265 10*3/uL (ref 150.0–400.0)
RBC: 4.57 Mil/uL (ref 3.87–5.11)
RDW: 13.4 % (ref 11.5–15.5)
WBC: 11.3 10*3/uL — ABNORMAL HIGH (ref 4.0–10.5)

## 2015-05-13 ENCOUNTER — Encounter: Payer: Self-pay | Admitting: Internal Medicine

## 2015-05-13 ENCOUNTER — Encounter: Payer: Self-pay | Admitting: *Deleted

## 2015-05-15 ENCOUNTER — Encounter: Payer: Self-pay | Admitting: Internal Medicine

## 2015-05-15 DIAGNOSIS — R0981 Nasal congestion: Secondary | ICD-10-CM | POA: Insufficient documentation

## 2015-05-15 NOTE — Addendum Note (Signed)
Addended by: Alisa Graff on: 05/15/2015 04:36 PM   Modules accepted: Orders

## 2015-05-15 NOTE — Assessment & Plan Note (Signed)
Blood pressure elevated in ER.  Better today.  Have her decrease the triam/hctz to 1/2 tablet q day.  She is on imdur.  Follow pressures.  Follow metabolic panel.

## 2015-05-15 NOTE — Assessment & Plan Note (Signed)
Upper symptoms controlled on omeprazole.  Follow.

## 2015-05-15 NOTE — Assessment & Plan Note (Signed)
Nasal congestion as outlined.  Do not feel abx warranted.  Saline nasal spray, nasacort nasal spray and robitussin as directed.  Follow.

## 2015-05-15 NOTE — Assessment & Plan Note (Signed)
Previous renal ultrasound revealed no significant abnormalities (03/2011).

## 2015-05-15 NOTE — Assessment & Plan Note (Signed)
Low cholesterol diet and exercise.  Follow lipid panel and liver function tests.  

## 2015-05-15 NOTE — Assessment & Plan Note (Signed)
On xanax.  Feels stable.  Follow.  Desires no further intervention.  Follow.

## 2015-05-15 NOTE — Assessment & Plan Note (Signed)
Low carb diet and exercise.  Follow met b and a1c.   

## 2015-05-15 NOTE — Assessment & Plan Note (Signed)
No pain now.  See ER note.  Cardiology recommended starting imdur and outpt evaluation.  She cancelled her appt.  Wants me to schedule cardiology at [facility].  Order for referral.

## 2015-05-17 ENCOUNTER — Inpatient Hospital Stay: Payer: Medicare PPO | Attending: Oncology

## 2015-05-17 ENCOUNTER — Inpatient Hospital Stay (HOSPITAL_BASED_OUTPATIENT_CLINIC_OR_DEPARTMENT_OTHER): Payer: Medicare PPO | Admitting: Oncology

## 2015-05-17 ENCOUNTER — Encounter: Payer: Self-pay | Admitting: Oncology

## 2015-05-17 VITALS — BP 124/78 | HR 73 | Temp 96.9°F | Wt 200.0 lb

## 2015-05-17 DIAGNOSIS — C189 Malignant neoplasm of colon, unspecified: Secondary | ICD-10-CM

## 2015-05-17 DIAGNOSIS — Z85038 Personal history of other malignant neoplasm of large intestine: Secondary | ICD-10-CM | POA: Diagnosis present

## 2015-05-17 DIAGNOSIS — F419 Anxiety disorder, unspecified: Secondary | ICD-10-CM | POA: Insufficient documentation

## 2015-05-17 DIAGNOSIS — Z9049 Acquired absence of other specified parts of digestive tract: Secondary | ICD-10-CM | POA: Insufficient documentation

## 2015-05-17 DIAGNOSIS — F329 Major depressive disorder, single episode, unspecified: Secondary | ICD-10-CM | POA: Insufficient documentation

## 2015-05-17 DIAGNOSIS — Z85118 Personal history of other malignant neoplasm of bronchus and lung: Secondary | ICD-10-CM

## 2015-05-17 DIAGNOSIS — C3490 Malignant neoplasm of unspecified part of unspecified bronchus or lung: Secondary | ICD-10-CM

## 2015-05-17 DIAGNOSIS — Z9221 Personal history of antineoplastic chemotherapy: Secondary | ICD-10-CM | POA: Insufficient documentation

## 2015-05-17 DIAGNOSIS — Z79899 Other long term (current) drug therapy: Secondary | ICD-10-CM | POA: Insufficient documentation

## 2015-05-17 DIAGNOSIS — J449 Chronic obstructive pulmonary disease, unspecified: Secondary | ICD-10-CM | POA: Insufficient documentation

## 2015-05-17 DIAGNOSIS — Z7982 Long term (current) use of aspirin: Secondary | ICD-10-CM | POA: Insufficient documentation

## 2015-05-17 DIAGNOSIS — E7800 Pure hypercholesterolemia, unspecified: Secondary | ICD-10-CM | POA: Insufficient documentation

## 2015-05-17 DIAGNOSIS — K219 Gastro-esophageal reflux disease without esophagitis: Secondary | ICD-10-CM | POA: Insufficient documentation

## 2015-05-17 DIAGNOSIS — Z1231 Encounter for screening mammogram for malignant neoplasm of breast: Secondary | ICD-10-CM

## 2015-05-17 DIAGNOSIS — I10 Essential (primary) hypertension: Secondary | ICD-10-CM | POA: Diagnosis not present

## 2015-05-17 DIAGNOSIS — Z87442 Personal history of urinary calculi: Secondary | ICD-10-CM | POA: Insufficient documentation

## 2015-05-17 LAB — COMPREHENSIVE METABOLIC PANEL
ALBUMIN: 3.7 g/dL (ref 3.5–5.0)
ALK PHOS: 91 U/L (ref 38–126)
ALT: 12 U/L — ABNORMAL LOW (ref 14–54)
ANION GAP: 6 (ref 5–15)
AST: 18 U/L (ref 15–41)
BILIRUBIN TOTAL: 0.5 mg/dL (ref 0.3–1.2)
BUN: 22 mg/dL — AB (ref 6–20)
CALCIUM: 9.1 mg/dL (ref 8.9–10.3)
CO2: 26 mmol/L (ref 22–32)
Chloride: 102 mmol/L (ref 101–111)
Creatinine, Ser: 1.31 mg/dL — ABNORMAL HIGH (ref 0.44–1.00)
GFR calc Af Amer: 48 mL/min — ABNORMAL LOW (ref >60)
GFR, EST NON AFRICAN AMERICAN: 42 mL/min — AB (ref >60)
GLUCOSE: 100 mg/dL — AB (ref 65–99)
Potassium: 4.2 mmol/L (ref 3.5–5.1)
Sodium: 134 mmol/L — ABNORMAL LOW (ref 135–145)
TOTAL PROTEIN: 7.3 g/dL (ref 6.5–8.1)

## 2015-05-17 LAB — CBC WITH DIFFERENTIAL/PLATELET
BASOS ABS: 0.1 10*3/uL (ref 0–0.1)
BASOS PCT: 1 %
EOS PCT: 2 %
Eosinophils Absolute: 0.1 10*3/uL (ref 0–0.7)
HCT: 36 % (ref 35.0–47.0)
Hemoglobin: 12.1 g/dL (ref 12.0–16.0)
Lymphocytes Relative: 28 %
Lymphs Abs: 2 10*3/uL (ref 1.0–3.6)
MCH: 28.2 pg (ref 26.0–34.0)
MCHC: 33.5 g/dL (ref 32.0–36.0)
MCV: 84.2 fL (ref 80.0–100.0)
MONO ABS: 0.4 10*3/uL (ref 0.2–0.9)
Monocytes Relative: 5 %
Neutro Abs: 4.5 10*3/uL (ref 1.4–6.5)
Neutrophils Relative %: 64 %
Platelets: 246 10*3/uL (ref 150–440)
RBC: 4.27 MIL/uL (ref 3.80–5.20)
RDW: 13.2 % (ref 11.5–14.5)
WBC: 7.1 10*3/uL (ref 3.6–11.0)

## 2015-05-17 NOTE — Progress Notes (Signed)
[place] @ [facility] Telephone:(336) [phone]  Fax:(336) [place]: 1950/11/13  MR#: [id number]  WER#:[id number]  Patient Care Team: Einar Pheasant, MD as PCP - General (Unknown Physician Specialty)  CHIEF COMPLAINT: Oncology history      1. Carcinoma of colon metastasis disease to lung, status post resection. Diagnosis of carcinoma of rectum. T2, N0, M0 tumor. In May of 2000. 2. Metastasis disease to lung, status post resection August of 2008 3. Finished chemotherapy with 5-FU, oxaliplatin, and leucovorin in March of 2009   No history exists.    Oncology Flowsheet 05/04/2015  ALPRAZolam (XANAX) PO 0.5 mg    INTERVAL HISTORY: 64 year old lady came today further follow-up regarding carcinoma of colon metastases to the lung status post resection followed by chemotherapy Patient recently had a pain in the right lower quadrant underwent evaluation with CT scan and colonoscopy No evidence of malignancy was found.  Patient also had a mammogram done Here for further follow-up.  No chills.  No fever.  No nausea or vomiting.  Appetite has been stable. REVIEW OF SYSTEMS:   GENERAL:  Feels good.  Active.  No fevers, sweats or weight loss. PERFORMANCE STATUS (ECOG):0 HEENT:  No visual changes, runny nose, sore throat, mouth sores or tenderness. Lungs: No shortness of breath or cough.  No hemoptysis. Cardiac:  No chest pain, palpitations, orthopnea, or PND. GI:  No nausea, vomiting, diarrhea, constipation, melena or hematochezia. GU:  No urgency, frequency, dysuria, or hematuria. Musculoskeletal:  No back pain.  No joint pain.  No muscle tenderness. Extremities:  No pain or swelling. Skin:  No rashes or skin changes. Neuro:  No headache, numbness or weakness, balance or coordination issues. Endocrine:  No diabetes, thyroid issues, hot flashes or night sweats. Psych:  No mood changes, depression or anxiety. Pain:  No focal pain. Review of systems:  All other  systems reviewed and found to be negative. As per HPI. Otherwise, a complete review of systems is negatve.  PAST MEDICAL HISTORY: Past Medical History  Diagnosis Date  . Colon cancer ([place]) 1999    Pt had partial colectomy. Did develop lung metastasis. Had right upper lobectomy in 01/2007 then had associated chemotherapy.  . Hypercholesterolemia   . Hypertension   . Depression   . Nephrolithiasis   . Elevated transaminase level     ? NASH  . GERD (gastroesophageal reflux disease)   . Holter monitor, abnormal 12/2009    NSR, rare PACs and PVCs  . Echocardiogram abnormal 02/2010    EF 55-60%, mild LAE, no regional wall motion abnormalities  . Lung cancer ([place])     S/P resection (lobectomy - right)  . COPD (chronic obstructive pulmonary disease) ([place])   . Anxiety   . DDD (degenerative disc disease), lumbosacral   . Hematuria   . Leaky heart valve     PAST SURGICAL HISTORY: Past Surgical History  Procedure Laterality Date  . Colon surgery      Colon cancer, removed part of large colon  . Lobectomy      Right lung, upper lobe right side removed  . Cardiac catheterization  2003    negative as per pt report  . Cholecystectomy  2000  . Tubal ligation  1980  . Total abdominal hysterectomy      abnormal bleeding  . Breast biopsy  10/07/2009    Right breast  . Colonoscopy N/A 04/25/2015    Procedure: COLONOSCOPY;  Surgeon: Manya Silvas, MD;  Location:  [facility];  Service: Endoscopy;  Laterality: N/A;    FAMILY HISTORY Family History  Problem Relation Age of Onset  . Coronary artery disease Mother   . Heart attack Father 54    MI  . Cancer Father     lung  . Stroke Father   . Hypertension Father   . Sudden death Brother   . Mental illness Mother   . Mental illness Brother   . Diabetes Mother    Macrobid: Unknown  Significant History/PMH:   Panic Attacks:    Anxiety:    Cancer, Colon or Rectal:    "retains fluids":    DEPRESSION:    HTN:      right upper lobe removed from CA:    Tubal ligation: 1980   Hysterectomy: 2003   cholecystectomy: 2000   Colon surgery: 1998  Preventive Screening:  Has patient had any of the following test? Colonscopy  Mammography (1)   Last Colonoscopy: 2012- Dr Charlies Silvers)   Last Mammography: 2012(1)   Smoking History: Smoking History Never Smoked.(1)  PFSH: Comments: No family history of colorectal cancer, breast cancer, or ovarian cancer.  Social History: negative alcohol, negative tobacco  Additional Past Medical and Surgical History: does not smoke. Does not drink    ADVANCED DIRECTIVES:  Patient does have advance healthcare directive, Patient   does not desire to make any changes HEALTH MAINTENANCE: Social History  Substance Use Topics  . Smoking status: Never Smoker   . Smokeless tobacco: Never Used  . Alcohol Use: 0.6 oz/week    0 Standard drinks or equivalent, 1 Glasses of wine per week      Allergies  Allergen Reactions  . Antihistamines, Diphenhydramine-Type     Other reaction(s): Other (See Comments) She does not know which antihistamine she has an allergic reaction to.  . Aspirin     Other reaction(s): Other (See Comments) Makes her sick on the stomach.  Of note, she can take ibuprofen.  . Ceftin [Cefuroxime Axetil] Other (See Comments)    Chest pains  . Celecoxib     Other reaction(s): Blood Disorder GI bleeding.  . Ciprofloxacin   . Cymbalta [Duloxetine Hcl]   . Diphenhydramine Other (See Comments)  . Escitalopram Other (See Comments)  . Lexapro [Escitalopram Oxalate]     Difficulty with speech  . Macrobid [Nitrofurantoin Macrocrystal]   . Metronidazole Other (See Comments)    Other reaction(s): Unknown  . Zoloft [Sertraline Hcl]     Current Outpatient Prescriptions  Medication Sig Dispense Refill  . ALPRAZolam (XANAX) 0.5 MG tablet Take 1 tablet (0.5 mg total) by mouth daily as needed for anxiety. 60 tablet 0  . aspirin EC 325 MG tablet Take 1  tablet (325 mg total) by mouth daily. 30 tablet 0  . cyclobenzaprine (FLEXERIL) 5 MG tablet Take 1 tablet (5 mg total) by mouth 3 (three) times daily as needed for muscle spasms. 30 tablet 0  . isosorbide mononitrate (IMDUR) 30 MG 24 hr tablet Take 1 tablet (30 mg total) by mouth daily. 30 tablet 0  . mometasone (NASONEX) 50 MCG/ACT nasal spray Place 2 sprays into the nose daily. 17 g 6  . Multiple Vitamin (MULTIVITAMIN) tablet Take 1 tablet by mouth daily.    Marland Kitchen omeprazole (PRILOSEC) 20 MG capsule TAKE ONE (1) CAPSULE EACH DAY 30 capsule 4  . potassium chloride SA (K-DUR,KLOR-CON) 20 MEQ tablet TAKE ONE TABLET TWICE DAILY 60 tablet 6  . PROAIR HFA 108 (90 BASE) MCG/ACT inhaler INHALE TWO  PUFFS INTO THE LUNGS EVERY 6 HOURS AS NEEDED FOR WHEEZING 8.5 g 6  . traMADol (ULTRAM) 50 MG tablet Take 1 tablet (50 mg total) by mouth every 8 (eight) hours as needed. 30 tablet 0  . traZODone (DESYREL) 50 MG tablet Take 2 1/2 tablet q hs 90 tablet 1  . triamterene-hydrochlorothiazide (MAXZIDE-25) 37.5-25 MG per tablet TAKE ONE (1) TABLET EACH DAY 30 tablet 6   No current facility-administered medications for this visit.    OBJECTIVE:  Filed Vitals:   05/17/15 1050  BP: 124/78  Pulse: 73  Temp: 96.9 F (36.1 C)     Body mass index is 34.31 kg/(m^2).    ECOG FS:0 - Asymptomatic  PHYSICAL EXAM:GENERAL:  Well developed, well nourished, sitting comfortably in the exam room in no acute distress. MENTAL STATUS:  Alert and oriented to person, place and time.  RESPIRATORY:  Clear to auscultation without rales, wheezes or rhonchi. CARDIOVASCULAR:  Regular rate and rhythm without murmur, rub or gallop. ABDOMEN:  Soft, non-tender, with active bowel sounds, and no hepatosplenomegaly.  No masses. BACK:  No CVA tenderness.  No tenderness on percussion of the back or rib cage. SKIN:  No rashes, ulcers or lesions. EXTREMITIES: No edema, no skin discoloration or tenderness.  No palpable cords. LYMPH NODES: No  palpable cervical, supraclavicular, axillary or inguinal adenopathy  NEUROLOGICAL: Unremarkable. PSYCH:  Appropriate. Breast examination as per primary with her physician LAB RESULTS:  CBC Latest Ref Rng 05/17/2015 05/10/2015  WBC 3.6 - 11.0 K/uL 7.1 11.3(H)  Hemoglobin 12.0 - 16.0 g/dL 12.1 12.9  Hematocrit 35.0 - 47.0 % 36.0 39.0  Platelets 150 - 440 K/uL 246 265.0    Appointment on 05/17/2015  Component Date Value Ref Range Status  . WBC 05/17/2015 7.1  3.6 - 11.0 K/uL Final  . RBC 05/17/2015 4.27  3.80 - 5.20 MIL/uL Final  . Hemoglobin 05/17/2015 12.1  12.0 - 16.0 g/dL Final  . HCT 05/17/2015 36.0  35.0 - 47.0 % Final  . MCV 05/17/2015 84.2  80.0 - 100.0 fL Final  . MCH 05/17/2015 28.2  26.0 - 34.0 pg Final  . MCHC 05/17/2015 33.5  32.0 - 36.0 g/dL Final  . RDW 05/17/2015 13.2  11.5 - 14.5 % Final  . Platelets 05/17/2015 246  150 - 440 K/uL Final  . Neutrophils Relative % 05/17/2015 64   Final  . Neutro Abs 05/17/2015 4.5  1.4 - 6.5 K/uL Final  . Lymphocytes Relative 05/17/2015 28   Final  . Lymphs Abs 05/17/2015 2.0  1.0 - 3.6 K/uL Final  . Monocytes Relative 05/17/2015 5   Final  . Monocytes Absolute 05/17/2015 0.4  0.2 - 0.9 K/uL Final  . Eosinophils Relative 05/17/2015 2   Final  . Eosinophils Absolute 05/17/2015 0.1  0 - 0.7 K/uL Final  . Basophils Relative 05/17/2015 1   Final  . Basophils Absolute 05/17/2015 0.1  0 - 0.1 K/uL Final  . Sodium 05/17/2015 134* 135 - 145 mmol/L Final  . Potassium 05/17/2015 4.2  3.5 - 5.1 mmol/L Final  . Chloride 05/17/2015 102  101 - 111 mmol/L Final  . CO2 05/17/2015 26  22 - 32 mmol/L Final  . Glucose, Bld 05/17/2015 100* 65 - 99 mg/dL Final  . BUN 05/17/2015 22* 6 - 20 mg/dL Final  . Creatinine, Ser 05/17/2015 1.31* 0.44 - 1.00 mg/dL Final  . Calcium 05/17/2015 9.1  8.9 - 10.3 mg/dL Final  . Total Protein 05/17/2015 7.3  6.5 - 8.1  g/dL Final  . Albumin 05/17/2015 3.7  3.5 - 5.0 g/dL Final  . AST 05/17/2015 18  15 - 41 U/L Final   . ALT 05/17/2015 12* 14 - 54 U/L Final  . Alkaline Phosphatase 05/17/2015 91  38 - 126 U/L Final  . Total Bilirubin 05/17/2015 0.5  0.3 - 1.2 mg/dL Final  . GFR calc non Af Amer 05/17/2015 42* >60 mL/min Final  . GFR calc Af Amer 05/17/2015 48* >60 mL/min Final   Comment: (NOTE) The eGFR has been calculated using the CKD EPI equation. This calculation has not been validated in all clinical situations. eGFR's persistently <60 mL/min signify possible Chronic Kidney Disease.   . Anion gap 05/17/2015 6  5 - 15 Final  Abstract on 05/13/2015  Component Date Value Ref Range Status  . HM Colonoscopy 04/25/2015 internal hemorrhoids.   Final       ASSESSMENT: Carcinoma of colon metastases to the lung status post resection followed by chemotherapy More than 5 years without any evidence of recurrent disease CT scan of abdomen in September as well AS colonoscopy done in November 16 has been reviewed.  There is no evidence of recurrent or progressive disease by radiological criteria.  MEDICAL DECISION MAKING:  All lab data has been reviewed.  CEA is pending. Patient will be reevaluated in 1 year with a chest x-ray CEA.  Patient understands that she can call me any time he developed any new symptoms  Patient expressed understanding and was in agreement with this plan. She also understands that She can call clinic at any time with any questions, concerns, or complaints.    No matching staging information was found for the patient.  Forest Gleason, MD   05/17/2015 4:37 PM

## 2015-05-18 ENCOUNTER — Other Ambulatory Visit: Payer: Self-pay

## 2015-05-18 ENCOUNTER — Telehealth: Payer: Self-pay | Admitting: *Deleted

## 2015-05-18 ENCOUNTER — Encounter: Payer: Self-pay | Admitting: Internal Medicine

## 2015-05-18 LAB — CEA: CEA: 0.8 ng/mL (ref 0.0–4.7)

## 2015-05-18 MED ORDER — ISOSORBIDE MONONITRATE ER 30 MG PO TB24: 30.0000 mg | ORAL_TABLET | Freq: Every day | ORAL | Status: DC

## 2015-05-18 NOTE — Telephone Encounter (Signed)
Sent to the pharmacy

## 2015-05-18 NOTE — Telephone Encounter (Signed)
Patient has requested a medication refill for isosorbide.

## 2015-06-01 ENCOUNTER — Other Ambulatory Visit: Payer: Self-pay | Admitting: Internal Medicine

## 2015-06-08 ENCOUNTER — Other Ambulatory Visit: Payer: Self-pay | Admitting: *Deleted

## 2015-06-08 NOTE — Patient Outreach (Signed)
[facility]) Care Management  06/08/2015  GERARDO TERRITO [birth year] [id number]  RN Health Coach telephone call to patient.  H[employer] compliance verified. Per patient she is not feeling well and would like for me to call back in the morning because she is laying down.  Plan Call patient in the morning and do screening  [place] Management [phone]

## 2015-06-09 ENCOUNTER — Encounter: Payer: Self-pay | Admitting: *Deleted

## 2015-06-09 ENCOUNTER — Other Ambulatory Visit: Payer: Self-pay | Admitting: *Deleted

## 2015-06-09 NOTE — Patient Outreach (Signed)
[facility]) Care Management  06/09/2015  Victoria Hanson Nov 15, 1950 [id number]   RN Health Coach telephone call to patient.  Hip[employer] compliance verified. Initial call for higher ED Utilization. Per patient she receives a call from [facility] once a month from [place] to follow up on her illnesses. Patient declined further telephonic outreach calls. Per patient she is able to afford her medications and has no questions. Plan: RN Health Coach will send out an information packet   [place] Management [phone]

## 2015-06-16 ENCOUNTER — Other Ambulatory Visit (INDEPENDENT_AMBULATORY_CARE_PROVIDER_SITE_OTHER): Payer: Medicare PPO

## 2015-06-16 DIAGNOSIS — C189 Malignant neoplasm of colon, unspecified: Secondary | ICD-10-CM | POA: Diagnosis not present

## 2015-06-16 DIAGNOSIS — I10 Essential (primary) hypertension: Secondary | ICD-10-CM

## 2015-06-16 DIAGNOSIS — R739 Hyperglycemia, unspecified: Secondary | ICD-10-CM

## 2015-06-16 DIAGNOSIS — E7800 Pure hypercholesterolemia, unspecified: Secondary | ICD-10-CM

## 2015-06-16 LAB — LIPID PANEL
CHOLESTEROL: 200 mg/dL (ref 0–200)
HDL: 50.3 mg/dL (ref >39.00)
LDL CALC: 121 mg/dL — AB (ref 0–99)
NonHDL: 149.86
TRIGLYCERIDES: 143 mg/dL (ref 0.0–149.0)
Total CHOL/HDL Ratio: 4
VLDL: 28.6 mg/dL (ref 0.0–40.0)

## 2015-06-16 LAB — HEPATIC FUNCTION PANEL
ALT: 17 U/L (ref 0–35)
AST: 19 U/L (ref 0–37)
Albumin: 3.7 g/dL (ref 3.5–5.2)
Alkaline Phosphatase: 96 U/L (ref 39–117)
BILIRUBIN TOTAL: 0.3 mg/dL (ref 0.2–1.2)
Bilirubin, Direct: 0.1 mg/dL (ref 0.0–0.3)
Total Protein: 7 g/dL (ref 6.0–8.3)

## 2015-06-16 LAB — BASIC METABOLIC PANEL
BUN: 25 mg/dL — ABNORMAL HIGH (ref 6–23)
CALCIUM: 10.2 mg/dL (ref 8.4–10.5)
CO2: 28 meq/L (ref 19–32)
CREATININE: 1.17 mg/dL (ref 0.40–1.20)
Chloride: 106 mEq/L (ref 96–112)
GFR: 49.25 mL/min — ABNORMAL LOW (ref >60.00)
GLUCOSE: 103 mg/dL — AB (ref 70–99)
Potassium: 4.2 mEq/L (ref 3.5–5.1)
Sodium: 140 mEq/L (ref 135–145)

## 2015-06-16 LAB — HEMOGLOBIN A1C: Hgb A1c MFr Bld: 6 % (ref 4.6–6.5)

## 2015-06-17 ENCOUNTER — Ambulatory Visit (INDEPENDENT_AMBULATORY_CARE_PROVIDER_SITE_OTHER): Payer: Medicare PPO | Admitting: Internal Medicine

## 2015-06-17 ENCOUNTER — Encounter: Payer: Self-pay | Admitting: Internal Medicine

## 2015-06-17 VITALS — BP 120/80 | HR 73 | Temp 98.3°F | Resp 18 | Ht 64.0 in | Wt 202.4 lb

## 2015-06-17 DIAGNOSIS — E7800 Pure hypercholesterolemia, unspecified: Secondary | ICD-10-CM

## 2015-06-17 DIAGNOSIS — F32A Depression, unspecified: Secondary | ICD-10-CM

## 2015-06-17 DIAGNOSIS — F419 Anxiety disorder, unspecified: Secondary | ICD-10-CM

## 2015-06-17 DIAGNOSIS — F329 Major depressive disorder, single episode, unspecified: Secondary | ICD-10-CM

## 2015-06-17 DIAGNOSIS — R739 Hyperglycemia, unspecified: Secondary | ICD-10-CM

## 2015-06-17 DIAGNOSIS — C189 Malignant neoplasm of colon, unspecified: Secondary | ICD-10-CM | POA: Diagnosis not present

## 2015-06-17 DIAGNOSIS — I10 Essential (primary) hypertension: Secondary | ICD-10-CM

## 2015-06-17 DIAGNOSIS — R928 Other abnormal and inconclusive findings on diagnostic imaging of breast: Secondary | ICD-10-CM

## 2015-06-17 DIAGNOSIS — C3490 Malignant neoplasm of unspecified part of unspecified bronchus or lung: Secondary | ICD-10-CM | POA: Diagnosis not present

## 2015-06-17 DIAGNOSIS — N289 Disorder of kidney and ureter, unspecified: Secondary | ICD-10-CM | POA: Diagnosis not present

## 2015-06-17 DIAGNOSIS — R195 Other fecal abnormalities: Secondary | ICD-10-CM

## 2015-06-17 MED ORDER — ALPRAZOLAM 0.5 MG PO TABS: 0.5000 mg | ORAL_TABLET | Freq: Every day | ORAL | Status: DC | PRN

## 2015-06-17 MED ORDER — ISOSORBIDE MONONITRATE ER 30 MG PO TB24: 30.0000 mg | ORAL_TABLET | Freq: Every day | ORAL | Status: DC

## 2015-06-17 MED ORDER — TRAZODONE HCL 50 MG PO TABS: ORAL_TABLET | ORAL | Status: DC

## 2015-06-17 NOTE — Progress Notes (Signed)
Patient ID: DEARI SESSLER, female   DOB: Aug 09, 1950, 65 y.o.   MRN: [id number]   Subjective:    Patient ID: IMOGEAN CIAMPA, female    DOB: 01-08-1951, 65 y.o.   MRN: [id number]  HPI  Patient with past history of colon cancer, hypertension, depression, hypercholesterolemia and lung cancer.  She comes in today for a scheduled follow up of these issues.  She reports some increased anxiety.  She is taking 1/2 xanax bid.  This works for her.  Have tried SSRI in the past.  Felt this worked better for her.  She does report some funny dreams when she takes the xanax in the evening.  We discussed only taking the 1/2 xanax in the am.  Takes trazodone at night.  Has recently decreased the dose.  Was previously seen in ER for chest pain.  Refer to ER note for details.  Started on Imdur.  Was informed in the ER to f/u with Dr Chancy Milroy.  She has not had cardiology follow up.  No current chest pain or tightness.  Breathing stable.  Has appt with Dr Fletcher Anon 06/21/15.  Bowels are better.  Taking colestid.  Apparently will also take a anti diarrhea tablet.  Discussed not taking the antidiarrhea tablet.  Blood pressure doing well.  No urinary symptoms.     Past Medical History  Diagnosis Date  . Colon cancer ([place]) 1999    Pt had partial colectomy. Did develop lung metastasis. Had right upper lobectomy in 01/2007 then had associated chemotherapy.  . Hypercholesterolemia   . Hypertension   . Depression   . Nephrolithiasis   . Elevated transaminase level     ? NASH  . GERD (gastroesophageal reflux disease)   . Holter monitor, abnormal 12/2009    NSR, rare PACs and PVCs  . Echocardiogram abnormal 02/2010    EF 55-60%, mild LAE, no regional wall motion abnormalities  . Lung cancer ([place])     S/P resection (lobectomy - right)  . COPD (chronic obstructive pulmonary disease) ([place])   . Anxiety   . DDD (degenerative disc disease), lumbosacral   . Hematuria   . Leaky heart valve    Past Surgical History  Procedure  Laterality Date  . Colon surgery      Colon cancer, removed part of large colon  . Lobectomy      Right lung, upper lobe right side removed  . Cardiac catheterization  2003    negative as per pt report  . Cholecystectomy  2000  . Tubal ligation  1980  . Total abdominal hysterectomy      abnormal bleeding  . Breast biopsy  10/07/2009    Right breast  . Colonoscopy N/A 04/25/2015    Procedure: COLONOSCOPY;  Surgeon: Manya Silvas, MD;  Location: [facility] ENDOSCOPY;  Service: Endoscopy;  Laterality: N/A;   Family History  Problem Relation Age of Onset  . Coronary artery disease Mother   . Heart attack Father 50    MI  . Cancer Father     lung  . Stroke Father   . Hypertension Father   . Sudden death Brother   . Mental illness Mother   . Mental illness Brother   . Diabetes Mother    Social History   Social History  . Marital Status: Widowed    Spouse Name: N/A  . Number of Children: N/A  . Years of Education: N/A   Social History Main Topics  . Smoking status: Never Smoker   .  Smokeless tobacco: Never Used  . Alcohol Use: 0.6 oz/week    0 Standard drinks or equivalent, 1 Glasses of wine per week  . Drug Use: No  . Sexual Activity: Not Asked   Other Topics Concern  . None   Social History Narrative   Widowed   Disabled   Gets regular exercise, walks and plays volleyball    Outpatient Encounter Prescriptions as of 06/17/2015  Medication Sig  . ALPRAZolam (XANAX) 0.5 MG tablet Take 1 tablet (0.5 mg total) by mouth daily as needed for anxiety.  . aspirin EC 325 MG tablet Take 1 tablet (325 mg total) by mouth daily.  . cyclobenzaprine (FLEXERIL) 5 MG tablet Take 1 tablet (5 mg total) by mouth 3 (three) times daily as needed for muscle spasms.  . isosorbide mononitrate (IMDUR) 30 MG 24 hr tablet Take 1 tablet (30 mg total) by mouth daily.  . mometasone (NASONEX) 50 MCG/ACT nasal spray Place 2 sprays into the nose daily.  . Multiple Vitamin (MULTIVITAMIN) tablet Take 1  tablet by mouth daily.  . omeprazole (PRILOSEC) 20 MG capsule TAKE ONE (1) CAPSULE EACH DAY  . potassium chloride SA (K-DUR,KLOR-CON) 20 MEQ tablet TAKE 1 TABLET BY MOUTH TWICE A DAY.  . PROAIR HFA 108 (90 BASE) MCG/ACT inhaler INHALE TWO PUFFS INTO THE LUNGS EVERY 6 HOURS AS NEEDED FOR WHEEZING  . traMADol (ULTRAM) 50 MG tablet Take 1 tablet (50 mg total) by mouth every 8 (eight) hours as needed.  . traZODone (DESYREL) 50 MG tablet Take 2 1/2 tablet q hs  . triamterene-hydrochlorothiazide (MAXZIDE-25) 37.5-25 MG per tablet TAKE ONE (1) TABLET EACH DAY  . [DISCONTINUED] ALPRAZolam (XANAX) 0.5 MG tablet Take 1 tablet (0.5 mg total) by mouth daily as needed for anxiety.  . [DISCONTINUED] isosorbide mononitrate (IMDUR) 30 MG 24 hr tablet Take 1 tablet (30 mg total) by mouth daily.  . [DISCONTINUED] traZODone (DESYREL) 50 MG tablet Take 2 1/2 tablet q hs   No facility-administered encounter medications on file as of 06/17/2015.    Review of Systems  Constitutional: Negative for appetite change and unexpected weight change.  HENT: Negative for congestion and sinus pressure.   Eyes: Negative for discharge and redness.  Respiratory: Negative for cough, chest tightness and shortness of breath.   Cardiovascular: Negative for chest pain, palpitations and leg swelling.  Gastrointestinal: Negative for nausea, vomiting and abdominal pain.       Bowels doing better on colestid.   Genitourinary: Negative for dysuria and difficulty urinating.  Musculoskeletal: Negative for back pain and joint swelling.  Skin: Negative for color change and rash.  Neurological: Negative for dizziness, light-headedness and headaches.  Psychiatric/Behavioral: Negative for dysphoric mood and agitation.       Increased stress as outlined.        Objective:     Blood pressure rechecked by me:  118/76  Physical Exam  Constitutional: She appears well-developed and well-nourished. No distress.  HENT:  Nose: Nose normal.    Mouth/Throat: Oropharynx is clear and moist.  Eyes: Conjunctivae are normal. Right eye exhibits no discharge. Left eye exhibits no discharge.  Neck: Neck supple. No thyromegaly present.  Cardiovascular: Normal rate and regular rhythm.   Pulmonary/Chest: Breath sounds normal. No respiratory distress. She has no wheezes.  Abdominal: Soft. Bowel sounds are normal. There is no tenderness.  Musculoskeletal: She exhibits no edema or tenderness.  Lymphadenopathy:    She has no cervical adenopathy.  Skin: No rash noted. No erythema.  Psychiatric:   She has a normal mood and affect. Her behavior is normal.    BP 120/80 mmHg  Pulse 73  Temp(Src) 98.3 F (36.8 C) (Oral)  Resp 18  Ht 5' 4" (1.626 m)  Wt 202 lb 6 oz (91.797 kg)  BMI 34.72 kg/m2  SpO2 97% Wt Readings from Last 3 Encounters:  06/17/15 202 lb 6 oz (91.797 kg)  05/17/15 199 lb 15.3 oz (90.7 kg)  05/10/15 196 lb (88.905 kg)     Lab Results  Component Value Date   WBC 7.1 05/17/2015   HGB 12.1 05/17/2015   HCT 36.0 05/17/2015   PLT 246 05/17/2015   GLUCOSE 103* 06/16/2015   CHOL 200 06/16/2015   TRIG 143.0 06/16/2015   HDL 50.30 06/16/2015   LDLDIRECT 163.4 12/31/2012   LDLCALC 121* 06/16/2015   ALT 17 06/16/2015   AST 19 06/16/2015   NA 140 06/16/2015   K 4.2 06/16/2015   CL 106 06/16/2015   CREATININE 1.17 06/16/2015   BUN 25* 06/16/2015   CO2 28 06/16/2015   TSH 3.393 05/04/2015   INR 1.0 12/12/2012   HGBA1C 6.0 06/16/2015       Assessment & Plan:   Problem List Items Addressed This Visit    Abnormal mammogram    Mammogram 11/15/14 - recommended f/u views.  F/u views 11/17/14 - Birads II. Recommended f/u in one year.        Anxiety, mild    On xanax.  Adjust doses as outlined.  Follow.        Relevant Medications   traZODone (DESYREL) 50 MG tablet   ALPRAZolam (XANAX) 0.5 MG tablet   Depression    Increased stress.  No significant depression.  Have tried SSRI.  Did not tolerate.  On xanax and  trazodone.  Will decrease the xanax to 1/2 tablet in the am.  Continue trazodone 2.5 tablets before bed.  Follow closely.  Get her back in soon to reassess.        Relevant Medications   traZODone (DESYREL) 50 MG tablet   ALPRAZolam (XANAX) 0.5 MG tablet   Hyperglycemia    Low carb diet and exercise.  Follow met b and a1c.       Hypertension - Primary    Blood pressure under good control.  Continue same medication regimen.  Follow pressures.  Follow metabolic panel.        Relevant Medications   isosorbide mononitrate (IMDUR) 30 MG 24 hr tablet   Loose stools    Saw GI.  See their note for details.  Colonoscopy as outlined.  Better on colestid.       Lung cancer ([place])    Followed by Dr Oliva Bustard.  Breathing stable.       Relevant Medications   ALPRAZolam (XANAX) 0.5 MG tablet   Malignant neoplasm of colon ([place])    Colonoscopy 04/25/15 - internal hemorrhoids.  Followed by GI and Dr Oliva Bustard.  He is following CEA.        Relevant Medications   ALPRAZolam (XANAX) 0.5 MG tablet   Pure hypercholesterolemia    Low cholesterol diet and exercise.  Unable to take statin medication.  Started cholestyramine.  Cholesterol better.  Follow.   Lab Results  Component Value Date   CHOL 200 06/16/2015   HDL 50.30 06/16/2015   LDLCALC 121* 06/16/2015   LDLDIRECT 163.4 12/31/2012   TRIG 143.0 06/16/2015   CHOLHDL 4 06/16/2015        Relevant Medications   isosorbide  mononitrate (IMDUR) 30 MG 24 hr tablet   Renal insufficiency    Previous renal ultrasound reveal no significant abnormality.  (03/2011).  Cr last checked 1.17.  (GFR - 49).  Stay hydrated.  Avoid antiinflammatories.  Follow metabolic panel.           Einar Pheasant, MD

## 2015-06-17 NOTE — Progress Notes (Signed)
Pre-visit discussion using our clinic review tool. No additional management support is needed unless otherwise documented below in the visit note.  

## 2015-06-19 ENCOUNTER — Encounter: Payer: Self-pay | Admitting: Internal Medicine

## 2015-06-19 DIAGNOSIS — R928 Other abnormal and inconclusive findings on diagnostic imaging of breast: Secondary | ICD-10-CM | POA: Insufficient documentation

## 2015-06-19 NOTE — Assessment & Plan Note (Signed)
Saw GI.  See their note for details.  Colonoscopy as outlined.  Better on colestid.

## 2015-06-19 NOTE — Assessment & Plan Note (Signed)
Colonoscopy 04/25/15 - internal hemorrhoids.  Followed by GI and Dr Oliva Bustard.  He is following CEA.

## 2015-06-19 NOTE — Assessment & Plan Note (Signed)
Blood pressure under good control.  Continue same medication regimen.  Follow pressures.  Follow metabolic panel.   

## 2015-06-19 NOTE — Assessment & Plan Note (Signed)
Mammogram 11/15/14 - recommended f/u views.  F/u views 11/17/14 - Birads II. Recommended f/u in one year.

## 2015-06-19 NOTE — Assessment & Plan Note (Signed)
Increased stress.  No significant depression.  Have tried SSRI.  Did not tolerate.  On xanax and trazodone.  Will decrease the xanax to 1/2 tablet in the am.  Continue trazodone 2.5 tablets before bed.  Follow closely.  Get her back in soon to reassess.

## 2015-06-19 NOTE — Assessment & Plan Note (Signed)
On xanax.  Adjust doses as outlined.  Follow.

## 2015-06-19 NOTE — Assessment & Plan Note (Signed)
Followed by Dr Oliva Bustard.  Breathing stable.

## 2015-06-19 NOTE — Assessment & Plan Note (Signed)
Low cholesterol diet and exercise.  Unable to take statin medication.  Started cholestyramine.  Cholesterol better.  Follow.   Lab Results  Component Value Date   CHOL 200 06/16/2015   HDL 50.30 06/16/2015   LDLCALC 121* 06/16/2015   LDLDIRECT 163.4 12/31/2012   TRIG 143.0 06/16/2015   CHOLHDL 4 06/16/2015

## 2015-06-19 NOTE — Assessment & Plan Note (Signed)
Low carb diet and exercise.  Follow met b and a1c.  

## 2015-06-19 NOTE — Assessment & Plan Note (Signed)
Previous renal ultrasound reveal no significant abnormality.  (03/2011).  Cr last checked 1.17.  (GFR - 49).  Stay hydrated.  Avoid antiinflammatories.  Follow metabolic panel.

## 2015-06-21 ENCOUNTER — Ambulatory Visit (INDEPENDENT_AMBULATORY_CARE_PROVIDER_SITE_OTHER): Payer: Medicare PPO | Admitting: Nurse Practitioner

## 2015-06-21 ENCOUNTER — Ambulatory Visit (INDEPENDENT_AMBULATORY_CARE_PROVIDER_SITE_OTHER): Payer: Medicare PPO

## 2015-06-21 ENCOUNTER — Encounter: Payer: Self-pay | Admitting: Nurse Practitioner

## 2015-06-21 ENCOUNTER — Other Ambulatory Visit: Payer: Self-pay

## 2015-06-21 VITALS — BP 120/68 | HR 69 | Ht 64.0 in | Wt 201.2 lb

## 2015-06-21 DIAGNOSIS — R0609 Other forms of dyspnea: Secondary | ICD-10-CM | POA: Diagnosis not present

## 2015-06-21 DIAGNOSIS — I10 Essential (primary) hypertension: Secondary | ICD-10-CM | POA: Diagnosis not present

## 2015-06-21 DIAGNOSIS — R5383 Other fatigue: Secondary | ICD-10-CM

## 2015-06-21 DIAGNOSIS — R002 Palpitations: Secondary | ICD-10-CM

## 2015-06-21 DIAGNOSIS — R0789 Other chest pain: Secondary | ICD-10-CM | POA: Diagnosis not present

## 2015-06-21 DIAGNOSIS — Z6833 Body mass index (BMI) 33.0-33.9, adult: Secondary | ICD-10-CM | POA: Insufficient documentation

## 2015-06-21 DIAGNOSIS — E785 Hyperlipidemia, unspecified: Secondary | ICD-10-CM | POA: Insufficient documentation

## 2015-06-21 DIAGNOSIS — R0602 Shortness of breath: Secondary | ICD-10-CM

## 2015-06-21 IMAGING — CR DG CHEST 2V
1 series · 2 of 2 positions shown · non-contrast
Comparison: 12/12/2012

CLINICAL DATA: Chest pain, history of colon cancer, shortness of
Breath

EXAM:
CHEST  2 VIEW

[Series 1: w chest pa · 0.14mm/px · 2 of 2 slices shown]
[im 1/2]
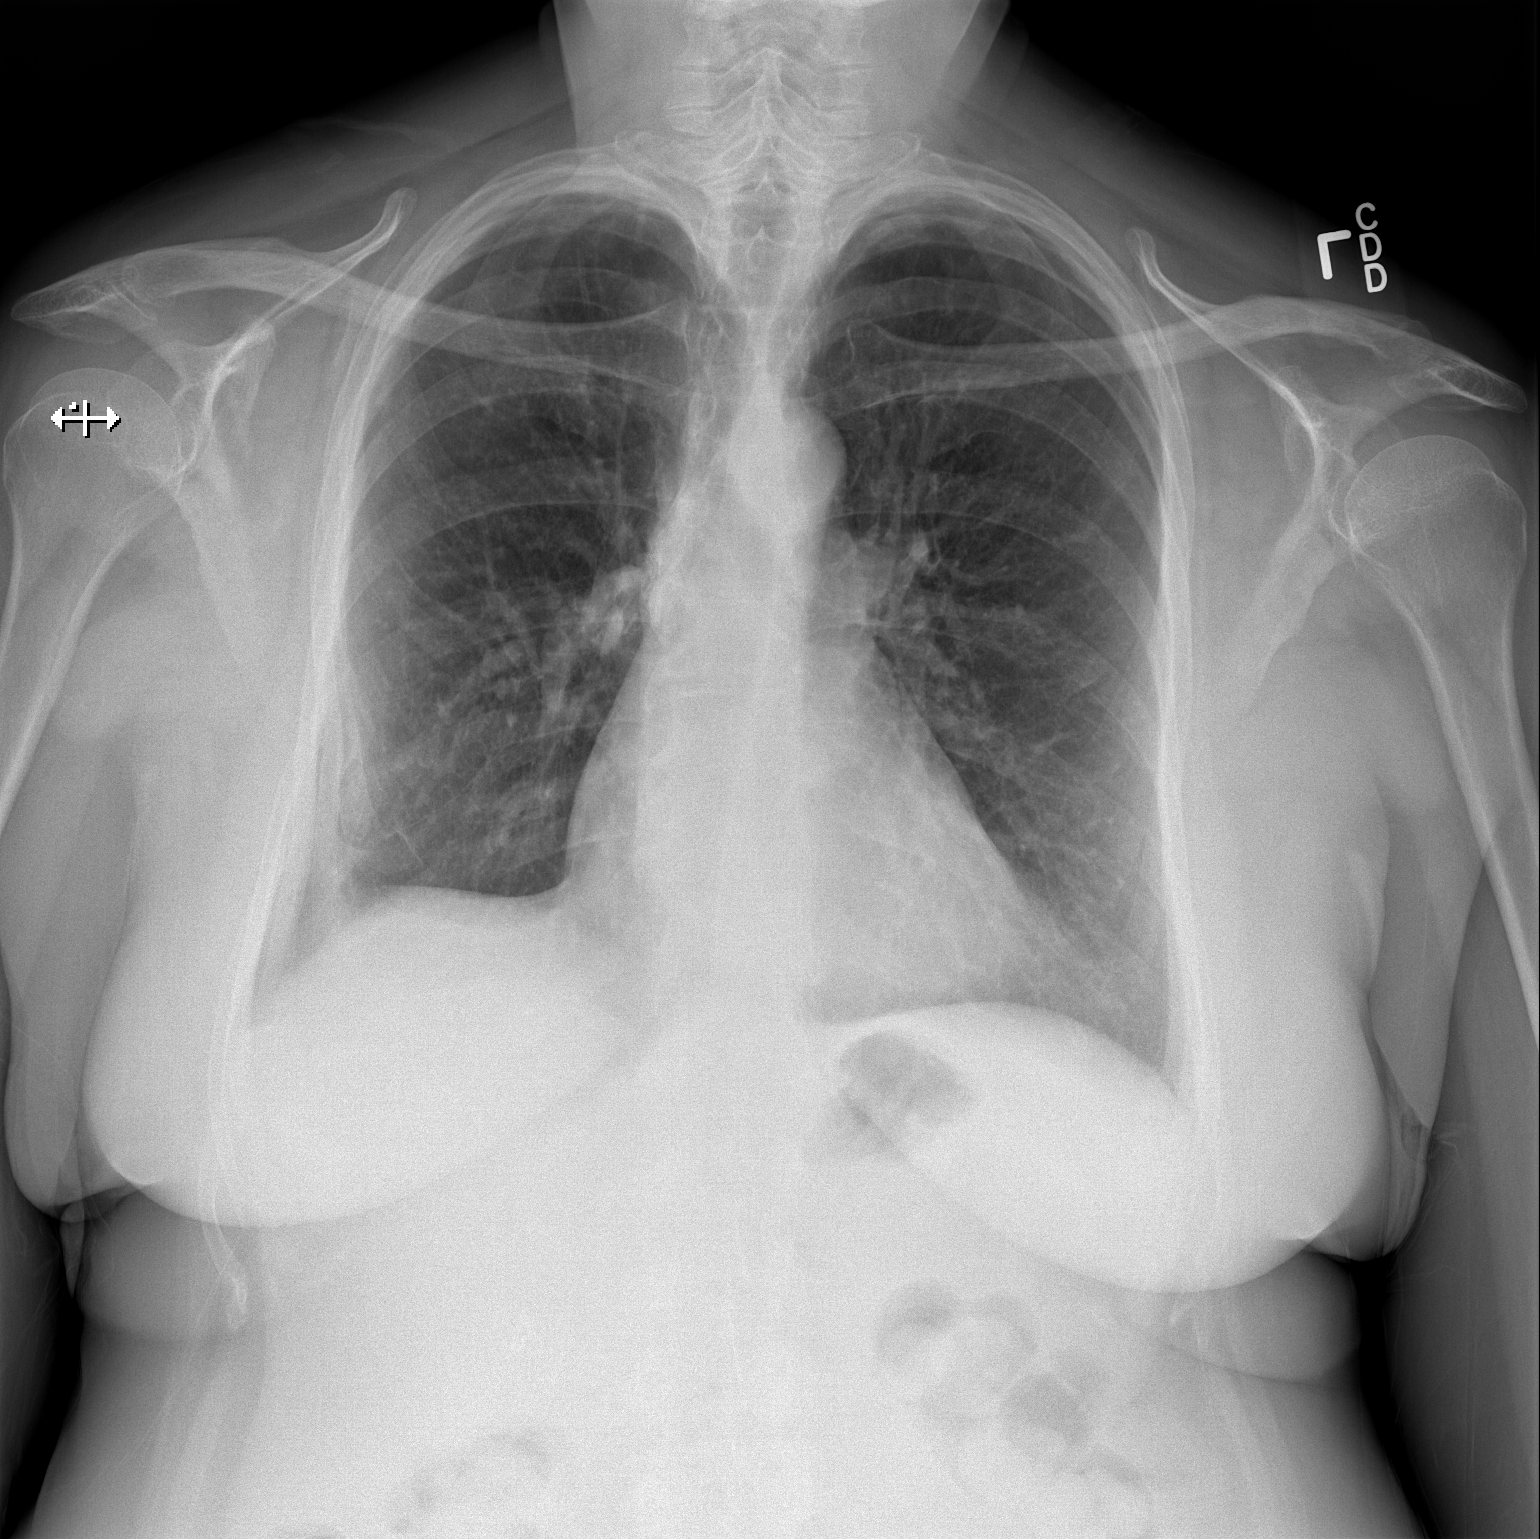
[im 2/2]
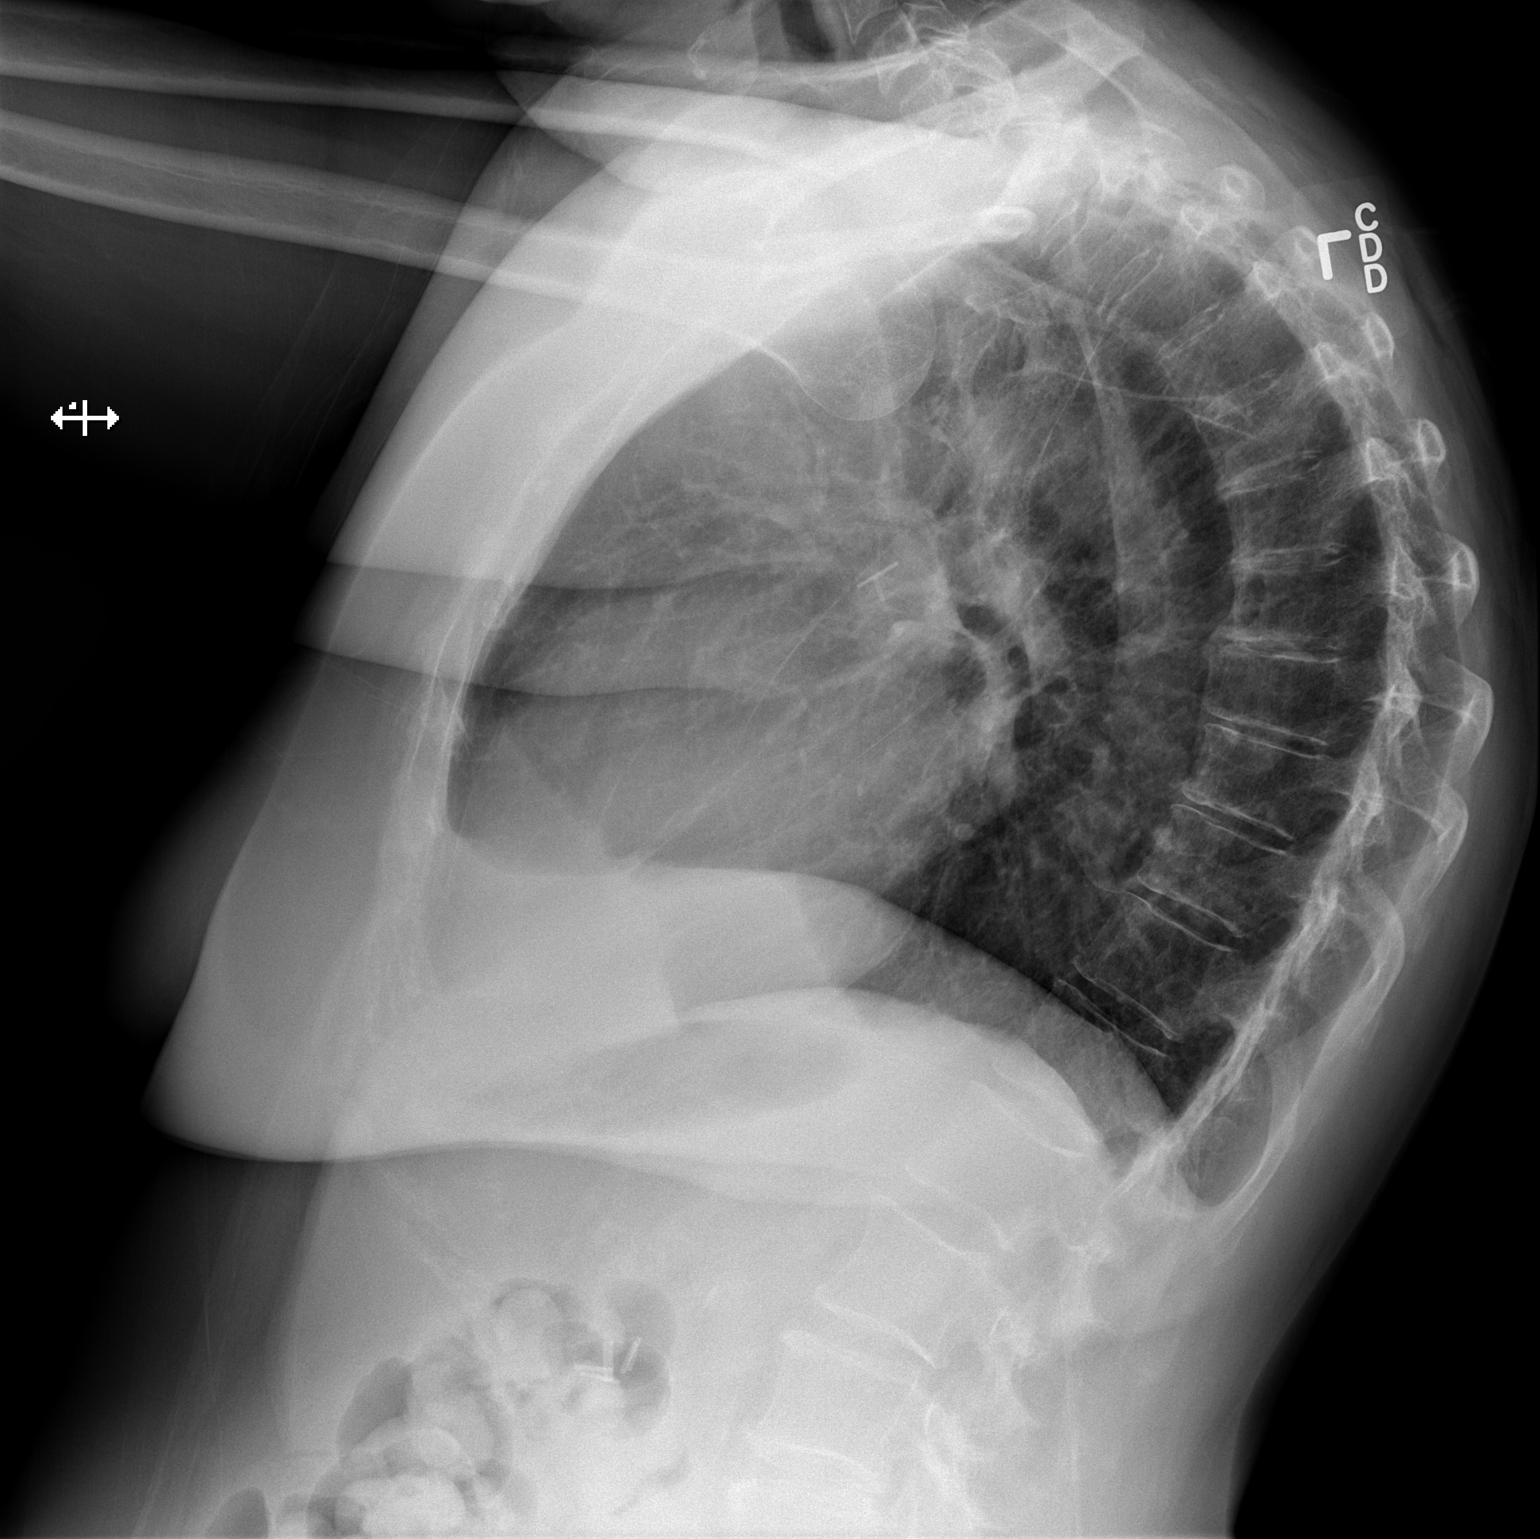

[2 of 2 positions shown; findings below may reference images not displayed]

FINDINGS: Cardiomediastinal silhouette is stable. No acute infiltrate or
pleural effusion. No pulmonary edema. Osteopenia and degenerative
changes thoracic spine. Mild thoracic kyphosis. Hyperinflation again
noted.
IMPRESSION: No active cardiopulmonary disease.  Hyperinflation again noted.

## 2015-06-21 NOTE — Progress Notes (Signed)
Cardiology Clinic Note   Patient Name: Victoria Hanson Date of Encounter: 06/21/2015  Primary Care Provider:  Einar Pheasant, MD Primary Cardiologist:  New (last seen in 2011 - [facility])  Patient Profile    With a history of hypertension, hyperlipidemia, colon cancer, and lung cancer who repeats for evaluation today related to chest pain.  Past Medical History    Past Medical History  Diagnosis Date  . Colon cancer ([place])     a. 1999: Pt had partial colectomy. Did develop lung metastasis. Had right upper lobectomy in 01/2007 then had associated chemotherapy.  . Hypercholesterolemia   . Hypertension   . Depression   . Nephrolithiasis   . Elevated transaminase level     a. ? NASH;  b. 06/2015 - nl LFTs.  . GERD (gastroesophageal reflux disease)   . Holter monitor, abnormal     a. 12/2009 Holter monitoring: NSR, rare PACs and PVCs.  . Echocardiogram abnormal     a. 02/2010 Echo: EF 55-60%, mild LAE, no regional wall motion abnormalities  . Lung cancer ([place])     a. S/P resection (lobectomy - right)  . COPD (chronic obstructive pulmonary disease) ([place])   . Anxiety   . DDD (degenerative disc disease), lumbosacral   . Hematuria   . Leaky heart valve     a. Per pt report - no evidence of valvular abnormalities on prior echoes.  . Atypical chest pain     a. 2003 Cath: reportedly nl per pt [name]);  b. 04/2015 [facility]mission, neg troponin.  . Morbid obesity ([place])   . Hyperlipidemia    Past Surgical History  Procedure Laterality Date  . Colon surgery      Colon cancer, removed part of large colon  . Lobectomy      Right lung, upper lobe right side removed  . Cholecystectomy  2000  . Tubal ligation  1980  . Total abdominal hysterectomy      abnormal bleeding  . Breast biopsy  10/07/2009    Right breast  . Colonoscopy N/A 04/25/2015    Procedure: COLONOSCOPY;  Surgeon: Manya Silvas, MD;  Location: [facility] ENDOSCOPY;  Service: Endoscopy;  Laterality: N/A;  .  Cardiac catheterization  2003    negative as per pt report    Allergies  Allergies  Allergen Reactions  . Antihistamines, Diphenhydramine-Type     Other reaction(s): Other (See Comments) She does not know which antihistamine she has an allergic reaction to.  . Aspirin     Other reaction(s): Other (See Comments) Makes her sick on the stomach.  Of note, she can take ibuprofen.  . Ceftin [Cefuroxime Axetil] Other (See Comments)    Chest pains  . Celecoxib     Other reaction(s): Blood Disorder GI bleeding.  . Ciprofloxacin   . Cymbalta [Duloxetine Hcl]   . Diphenhydramine Other (See Comments)  . Escitalopram Other (See Comments)  . Lexapro [Escitalopram Oxalate]     Difficulty with speech  . Macrobid [Nitrofurantoin Macrocrystal]   . Metronidazole Other (See Comments)    Other reaction(s): Unknown  . Zoloft [Sertraline Hcl]     History of Present Illness    65 year old female with the above complex past medical history. She has a history of somewhat atypical chest pain dating back nearly 20 years by her report. She has had previous evaluation at [facility] and reports a normal catheterization  in 2003. Other risk factors for coronary artery disease include hypertension, hyperlipidemia, family history, and obesity.  She also has a history of colon cancer with metastasis to the lungs and is status post partial colectomy, right-sided lobectomy, and chemotherapy.   [name] lives locally with her daughter, granddaughter, and son. She reports being very active. In late November, she had 2 episodes of sharp, fleeting midsternal chest pain lasting just seconds and resolving spontaneously without any associated symptoms. She presented to the [facility] emergency department her ECG was nonacute. She was admitted by internal medicine and subsequently ruled out for myocardial infarction. She was seen by cardiology Humphrey Rolls) and placed on aspirin and isosorbide. She was discharged and  advised to follow-up for an outpatient stress test but she decided she would like to see an alternate cardiology provider and arrangements were made for this appointment today.  Since her hospitalization in November, she thinks she may have had 2 or 3 recurrent episodes of sharp and fleeting midsternal chest discomfort without associated symptoms, lasting a few seconds, resolving spontaneously. She is also noted fatigue with mild dyspnea on exertion that has been more pronounced over the past month and a half and was present even prior to her hospitalization. She has a long history of palpitations and says that she notes fluttering in her chest most days of the week, usually lasting just a few seconds and resolving spontaneously. Sometimes symptoms are associated with lightheadedness. She has produced the warning monitor showed PACs and PVCs. She is not interested in wearing a monitor again.  She denies pnd, orthopnea, n, v, dizziness, syncope, weight gain, or early satiety.  She does occasionally note mild UE/LE edema.  Home Medications    Prior to Admission medications   Medication Sig Start Date End Date Taking? Authorizing Provider  ALPRAZolam Duanne Moron) 0.5 MG tablet Take 1 tablet (0.5 mg total) by mouth daily as needed for anxiety. 06/17/15  Yes Einar Pheasant, MD  aspirin EC 325 MG tablet Take 1 tablet (325 mg total) by mouth daily. 05/04/15  Yes Vipul Manuella Ghazi, MD  mometasone (NASONEX) 50 MCG/ACT nasal spray Place 2 sprays into the nose daily. 04/08/15  Yes Coral Spikes, DO  Multiple Vitamin (MULTIVITAMIN) tablet Take 1 tablet by mouth daily.   Yes Historical Provider, MD  omeprazole (PRILOSEC) 20 MG capsule TAKE ONE (1) CAPSULE EACH DAY 03/25/15  Yes Einar Pheasant, MD  potassium chloride SA (K-DUR,KLOR-CON) 20 MEQ tablet TAKE 1 TABLET BY MOUTH TWICE A DAY. 06/01/15  Yes Einar Pheasant, MD  PROAIR HFA 108 (90 BASE) MCG/ACT inhaler INHALE TWO PUFFS INTO THE LUNGS EVERY 6 HOURS AS NEEDED FOR WHEEZING  12/28/14  Yes Einar Pheasant, MD  traZODone (DESYREL) 50 MG tablet Take 2 1/2 tablet q hs 06/17/15  Yes Einar Pheasant, MD  triamterene-hydrochlorothiazide (MAXZIDE-25) 37.5-25 MG per tablet TAKE ONE (1) TABLET EACH DAY 04/13/14  Yes Einar Pheasant, MD    Family History    Family History  Problem Relation Age of Onset  . Coronary artery disease Mother     died @ 31 of MI.  Marland Kitchen Heart attack Father 61    MI  . Cancer Father     lung - died in his mid-70's  . Hypertension Father   . Sudden death Brother   . Mental illness Mother   . Mental illness Brother   . Diabetes Mother   . Diabetes Father   . Heart attack Brother     MI @ 41, died @ age 41.    Social History    Social History   Social  History  . Marital Status: Widowed    Spouse Name: N/A  . Number of Children: N/A  . Years of Education: N/A   Occupational History  . Not on file.   Social History Main Topics  . Smoking status: Passive Smoke Exposure - Never Smoker  . Smokeless tobacco: Never Used     Comment: husband and sons smoked in her home.  . Alcohol Use: 0.6 oz/week    0 Standard drinks or equivalent, 1 Glasses of wine per week     Comment: rare wine cooler.  . Drug Use: No  . Sexual Activity: Not on file   Other Topics Concern  . Not on file   Social History Narrative   Widowed.   Disabled (colon/lung CA), retired.   Lives in [place] with her dtr, grand-dtr, and son.   Active.   Gets regular exercise, walks.     Review of Systems    General:  No chills, fever, night sweats or weight changes.  Cardiovascular:  +++ sharp/fleeting chest pain as above, +++ fatigue and mild dyspnea on exertion, occas UE/LE edema, no orthopnea, palpitations, paroxysmal nocturnal dyspnea. Dermatological: No rash, lesions/masses Respiratory: No cough, +++ mild dyspnea on exertion. Urologic: No hematuria, dysuria Abdominal:   No nausea, vomiting, +++ chronic diarrhea since cholecystectomy, no bright red blood per  rectum, melena, or hematemesis Neurologic:  No visual changes, wkns, changes in mental status. All other systems reviewed and are otherwise negative except as noted above.  Physical Exam    VS:  BP 120/68 mmHg  Pulse 69  Ht '5\' 4"'$  (1.626 m)  Wt 201 lb 4 oz (91.286 kg)  BMI 34.53 kg/m2 , BMI Body mass index is 34.53 kg/(m^2). GEN: Well nourished, well developed, in no acute distress. HEENT: normal. Neck: Supple, no JVD, carotid bruits, or masses. Cardiac: RRR, no murmurs, rubs, or gallops. No clubbing, cyanosis, edema.  Radials/DP/PT 2+ and equal bilaterally.  Respiratory:  Respirations regular and unlabored, clear to auscultation bilaterally. GI: Soft, nontender, nondistended, BS + x 4. MS: no deformity or atrophy. Skin: warm and dry, no rash. Neuro:  Strength and sensation are intact. Psych: Normal affect.  Accessory Clinical Findings    ECG - RSR, 69, non-specific st/changes.  Lab Results  Component Value Date   WBC 7.1 05/17/2015   HGB 12.1 05/17/2015   HCT 36.0 05/17/2015   MCV 84.2 05/17/2015   PLT 246 05/17/2015    Lab Results  Component Value Date   ALT 17 06/16/2015   AST 19 06/16/2015   ALKPHOS 96 06/16/2015   BILITOT 0.3 06/16/2015   Lab Results  Component Value Date   CREATININE 1.17 06/16/2015   BUN 25* 06/16/2015   NA 140 06/16/2015   K 4.2 06/16/2015   CL 106 06/16/2015   CO2 28 06/16/2015   Lab Results  Component Value Date   CHOL 200 06/16/2015   HDL 50.30 06/16/2015   LDLCALC 121* 06/16/2015   LDLDIRECT 163.4 12/31/2012   TRIG 143.0 06/16/2015   CHOLHDL 4 06/16/2015   Lab Results  Component Value Date   TSH 3.393 05/04/2015    Assessment & Plan   1.  Atypical chest pain/fatigue and dyspnea on exertion: Patient was evaluated at [facility] in late November 2016 with complaints of sharp and fleeting chest pain, lasting a few seconds and resolving spontaneously. Cardiac enzymes were negative during that admission and she was seen by  cardiology and placed on aspirin and isosorbide. She was advised  to follow-up for stress testing but wished to establish care with our practice instead. Since then, she has had 2-3 recurrent episodes of sharp and fleeting chest pain but has also noted progressive fatigue with mild dyspnea on exertion. She has multiple risk factors for coronary artery disease including obesity, family history, hypertension, and hyperlipidemia. She also had fairly significant secondhand smoke exposure over her lifetime.  I will obtain an exercise Cardiolite to rule out ischemia along with a 2-D echocardiogram to evaluate LV function and assess for valvular abnormalities given history of dyspnea and reported history of valvular abnormalities (no significant murmurs noted on exam). Patient will remain on aspirin and wishes to discontinue isosorbide as she feels as though this has contributed to her fatigue. I think this is reasonable given her chest pain is fairly atypical.   2. Essential hypertension: This is well controlled on triamterene-HCTZ. No changes today.  3. Hyperlipidemia: She recently had lipids with a total cholesterol 200, triglycerides of 143, HDL 50, and LDL of 121. LFTs were normal. Her 10 year risk of a cardiac event calculates to 9.6%. She says that she was recently placed on cholestyramine by primary care for treatment of both chronic diarrhea and lipids.  4. Morbid obesity: Patient will benefit from weight loss. Await stress test results and if normal, would recommend a daily exercise regimen as well as reasonable caloric restriction goals.  5.  Palpitations:  She has a long h/o palpitations - currently occurring most days of the week, lasting a few seconds at a time, and resolving within a few seconds.  She has previously worn a monitor which showed PAC's and PVC's and is not interested in repeat monitoring at this time.  TSH and lytes were nl recently.  We could add bb or ccb (may be favored given current  fatigue) in the future if Ss become more bothersome.  6.  Disposition:  F/u echo and stress testing as above.  F/U in 1 month or sooner if necessary - pending testing.  Pt would ultimately like to have Dr. Fletcher Anon as her primary cardiologist.  Murray Hodgkins, NP 06/21/2015, 8:52 AM

## 2015-06-21 NOTE — Patient Instructions (Addendum)
Medication Instructions:  Your physician has recommended you make the following change in your medication:  STOP taking imdur    Labwork: none  Testing/Procedures: Your physician has requested that you have an echocardiogram. Echocardiography is a painless test that uses sound waves to create images of your heart. It provides your doctor with information about the size and shape of your heart and how well your heart's chambers and valves are working. This procedure takes approximately one hour. There are no restrictions for this procedure.  Your physician has requested that you have a lexiscan myoview. For further information please visit [email or web address]. Please follow instruction sheet, as given.  [place]  Your caregiver has ordered a Stress Test with nuclear imaging. The purpose of this test is to evaluate the blood supply to your heart muscle. This procedure is referred to as a "Non-Invasive Stress Test." This is because other than having an IV started in your vein, nothing is inserted or "invades" your body. Cardiac stress tests are done to find areas of poor blood flow to the heart by determining the extent of coronary artery disease (CAD). Some patients exercise on a treadmill, which naturally increases the blood flow to your heart, while others who are  unable to walk on a treadmill due to physical limitations have a pharmacologic/chemical stress agent called Lexiscan . This medicine will mimic walking on a treadmill by temporarily increasing your coronary blood flow.   Please note: these test may take anywhere between 2-4 hours to complete  PLEASE REPORT TO [place] AT THE FIRST DESK WILL DIRECT YOU WHERE TO GO  Date of Procedure: Tuesday, Jan 17, 8:30am Arrival Time for Procedure: 8:15am  Instructions regarding medication: You may take your morning medications with a sip of water. Please bring your inhaler.     PLEASE NOTIFY THE OFFICE  AT LEAST 23 HOURS IN ADVANCE IF YOU ARE UNABLE TO KEEP YOUR APPOINTMENT.  [phone] AND  PLEASE NOTIFY NUCLEAR MEDICINE AT [facility] AT LEAST 24 HOURS IN ADVANCE IF YOU ARE UNABLE TO KEEP YOUR APPOINTMENT. [phone]  How to prepare for your Myoview test:   Do not eat or drink after midnight  No caffeine for 24 hours prior to test  No smoking 24 hours prior to test.  Your medication may be taken with water.  If your doctor stopped a medication because of this test, do not take that medication.  Ladies, please do not wear dresses.  Skirts or pants are appropriate. Please wear a short sleeve shirt.  No perfume, cologne or lotion.  Wear comfortable walking shoes. No heels!            Follow-Up: Your physician recommends that you schedule a follow-up appointment in: one month with Dr. Fletcher Anon or Ignacia Bayley, NP   Any Other Special Instructions Will Be Listed Below (If Applicable).     If you need a refill on your cardiac medications before your next appointment, please call your pharmacy.  Echocardiogram An echocardiogram, or echocardiography, uses sound waves (ultrasound) to produce an image of your heart. The echocardiogram is simple, painless, obtained within a short period of time, and offers valuable information to your health care provider. The images from an echocardiogram can provide information such as:  Evidence of coronary artery disease (CAD).  Heart size.  Heart muscle function.  Heart valve function.  Aneurysm detection.  Evidence of a past heart attack.  Fluid buildup around the heart.  Heart muscle thickening.  Assess heart valve function. LET [facility] CARE PROVIDER KNOW ABOUT:  Any allergies you have.  All medicines you are taking, including vitamins, herbs, eye drops, creams, and over-the-counter medicines.  Previous problems you or members of your family have had with the use of anesthetics.  Any blood disorders you have.  Previous  surgeries you have had.  Medical conditions you have.  Possibility of pregnancy, if this applies. BEFORE THE PROCEDURE  No special preparation is needed. Eat and drink normally.  PROCEDURE   In order to produce an image of your heart, gel will be applied to your chest and a wand-like tool (transducer) will be moved over your chest. The gel will help transmit the sound waves from the transducer. The sound waves will harmlessly bounce off your heart to allow the heart images to be captured in real-time motion. These images will then be recorded.  You may need an IV to receive a medicine that improves the quality of the pictures. AFTER THE PROCEDURE You may return to your normal schedule including diet, activities, and medicines, unless your health care provider tells you otherwise.   This information is not intended to replace advice given to you by your health care provider. Make sure you discuss any questions you have with your health care provider.   Document Released: 05/25/2000 Document Revised: 06/18/2014 Document Reviewed: 02/02/2013 Elsevier Interactive Patient Education 2016 [place]. Cardiac Nuclear Scanning A cardiac nuclear scan is used to check your heart for problems, such as the following:  A portion of the heart is not getting enough blood.  Part of the heart muscle has died, which happens with a heart attack.  The heart wall is not working normally.  In this test, a radioactive dye (tracer) is injected into your bloodstream. After the tracer has traveled to your heart, a scanning device is used to measure how much of the tracer is absorbed by or distributed to various areas of your heart. LET [facility] CARE PROVIDER KNOW ABOUT:  Any allergies you have.  All medicines you are taking, including vitamins, herbs, eye drops, creams, and over-the-counter medicines.  Previous problems you or members of your family have had with the use of anesthetics.  Any blood  disorders you have.  Previous surgeries you have had.  Medical conditions you have.  RISKS AND COMPLICATIONS Generally, this is a safe procedure. However, as with any procedure, problems can occur. Possible problems include:   Serious chest pain.  Rapid heartbeat.  Sensation of warmth in your chest. This usually passes quickly. BEFORE THE PROCEDURE Ask your health care provider about changing or stopping your regular medicines. PROCEDURE This procedure is usually done at a hospital and takes 2-4 hours.  An IV tube is inserted into one of your veins.  Your health care provider will inject a small amount of radioactive tracer through the tube.  You will then wait for 20-40 minutes while the tracer travels through your bloodstream.  You will lie down on an exam table so images of your heart can be taken. Images will be taken for about 15-20 minutes.  You will exercise on a treadmill or stationary bike. While you exercise, your heart activity will be monitored with an electrocardiogram (ECG), and your blood pressure will be checked.  If you are unable to exercise, you may be given a medicine to make your heart beat faster.  When blood flow to your heart has peaked, tracer will again be injected through the IV  tube.  After 20-40 minutes, you will get back on the exam table and have more images taken of your heart.  When the procedure is over, your IV tube will be removed. AFTER THE PROCEDURE  You will likely be able to leave shortly after the test. Unless your health care provider tells you otherwise, you may return to your normal schedule, including diet, activities, and medicines.  Make sure you find out how and when you will get your test results.   This information is not intended to replace advice given to you by your health care provider. Make sure you discuss any questions you have with your health care provider.   Document Released: 06/22/2004 Document Revised:  06/02/2013 Document Reviewed: 05/06/2013 Elsevier Interactive Patient Education [employer].

## 2015-06-27 ENCOUNTER — Telehealth: Payer: Self-pay

## 2015-06-27 NOTE — Telephone Encounter (Signed)
S/w pt to review treadmill myoview instructions. Pt verbalized understanding and states she will arrive at 8:15am

## 2015-06-28 ENCOUNTER — Ambulatory Visit
Admission: RE | Admit: 2015-06-28 | Discharge: 2015-06-28 | Disposition: A | Discharge status: Home / Self Care | Payer: Medicare PPO | Source: Ambulatory Visit | Attending: Nurse Practitioner | Admitting: Nurse Practitioner

## 2015-06-28 DIAGNOSIS — R0789 Other chest pain: Secondary | ICD-10-CM

## 2015-06-28 LAB — NM MYOCAR MULTI W/SPECT W/WALL MOTION / EF
CHL CUP NUCLEAR SDS: 0
CHL CUP NUCLEAR SRS: 6
CHL CUP NUCLEAR SSS: 2
CHL CUP RESTING HR STRESS: 66 {beats}/min
CHL CUP STRESS STAGE 1 GRADE: 0 %
CHL CUP STRESS STAGE 1 HR: 77 {beats}/min
CHL CUP STRESS STAGE 2 HR: 78 {beats}/min
CHL CUP STRESS STAGE 2 SPEED: 1 mph
CHL CUP STRESS STAGE 3 GRADE: 0 %
CHL CUP STRESS STAGE 3 HR: 78 {beats}/min
CHL CUP STRESS STAGE 4 HR: 107 {beats}/min
CSEPHR: 62 %
CSEPPBP: 157 mmHg
CSEPPHR: 82 {beats}/min
CSEPPMHR: 52 %
Estimated workload: 1.2 METS
LV dias vol: 57 mL
LV sys vol: 18 mL
NUC STRESS TID: 0.8
Stage 1 Speed: 0 mph
Stage 2 Grade: 0 %
Stage 3 Speed: 1 mph
Stage 4 Grade: 10 %
Stage 4 Speed: 1.7 mph
Stage 5 DBP: 90 mmHg
Stage 5 Grade: 12 %
Stage 5 HR: 82 {beats}/min
Stage 5 SBP: 157 mmHg
Stage 5 Speed: 0 mph

## 2015-06-28 MED ORDER — TECHNETIUM TC 99M SESTAMIBI - CARDIOLITE
13.0800 | Freq: Once | INTRAVENOUS | Status: AC | PRN
  Administered 2015-06-28: 13.08 via INTRAVENOUS

## 2015-06-28 MED ORDER — TECHNETIUM TC 99M SESTAMIBI - CARDIOLITE
31.2880 | Freq: Once | INTRAVENOUS | Status: AC | PRN
  Administered 2015-06-28: 10:00:00 31.288 via INTRAVENOUS

## 2015-06-28 MED ORDER — REGADENOSON 0.4 MG/5ML IV SOLN
0.4000 mg | Freq: Once | INTRAVENOUS | Status: AC
  Administered 2015-06-28: 0.4 mg via INTRAVENOUS

## 2015-06-30 ENCOUNTER — Other Ambulatory Visit: Payer: Self-pay | Admitting: Internal Medicine

## 2015-07-06 IMAGING — MG MM DIGITAL SCREENING BILAT W/ CAD
1 series · 5 of 5 positions shown · non-contrast
Comparison: Previous exam(s).

CLINICAL DATA: Screening. History of benign excisional biopsy.

EXAM:
DIGITAL SCREENING BILATERAL MAMMOGRAM WITH CAD

[R CC · right · 5 of 5 slices shown]
[im 1/5]
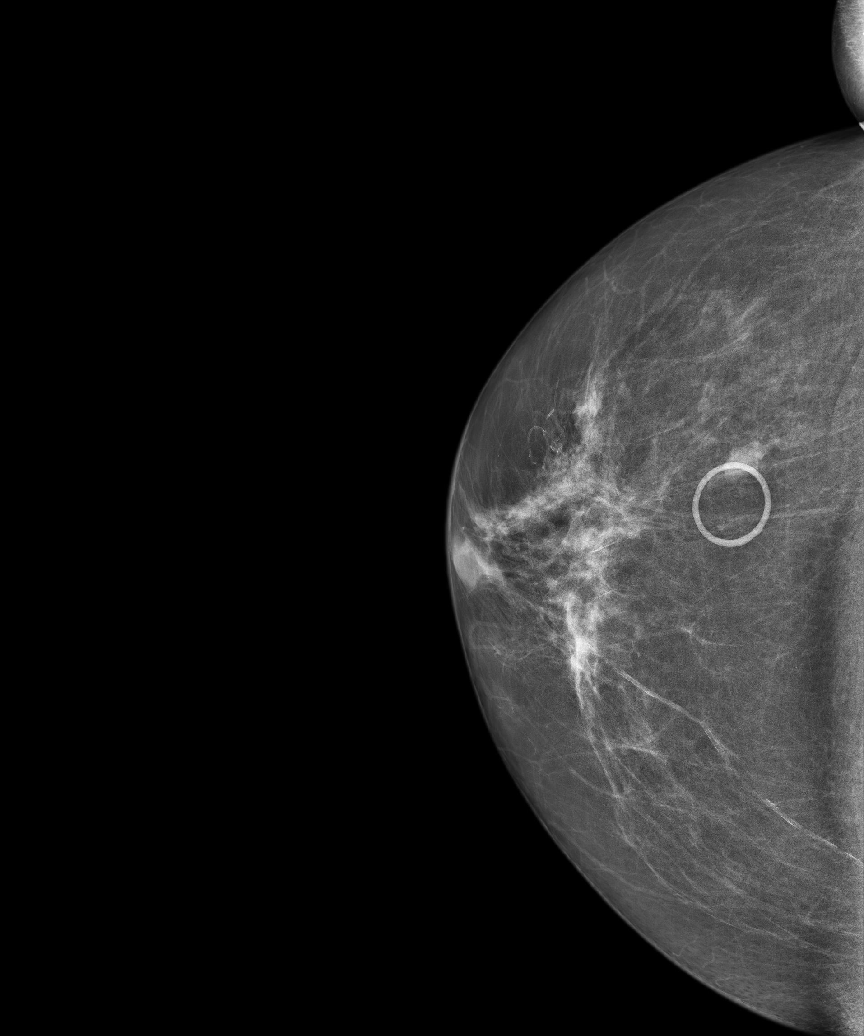
[im 2/5]
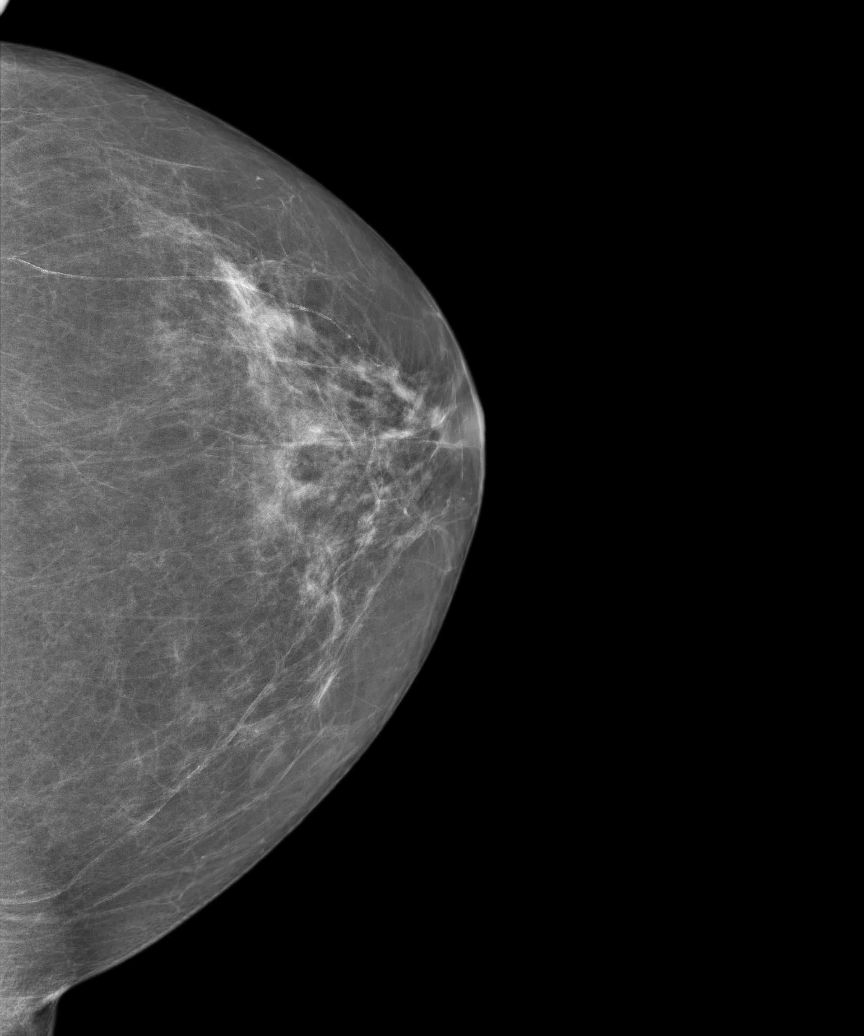
[im 3/5]
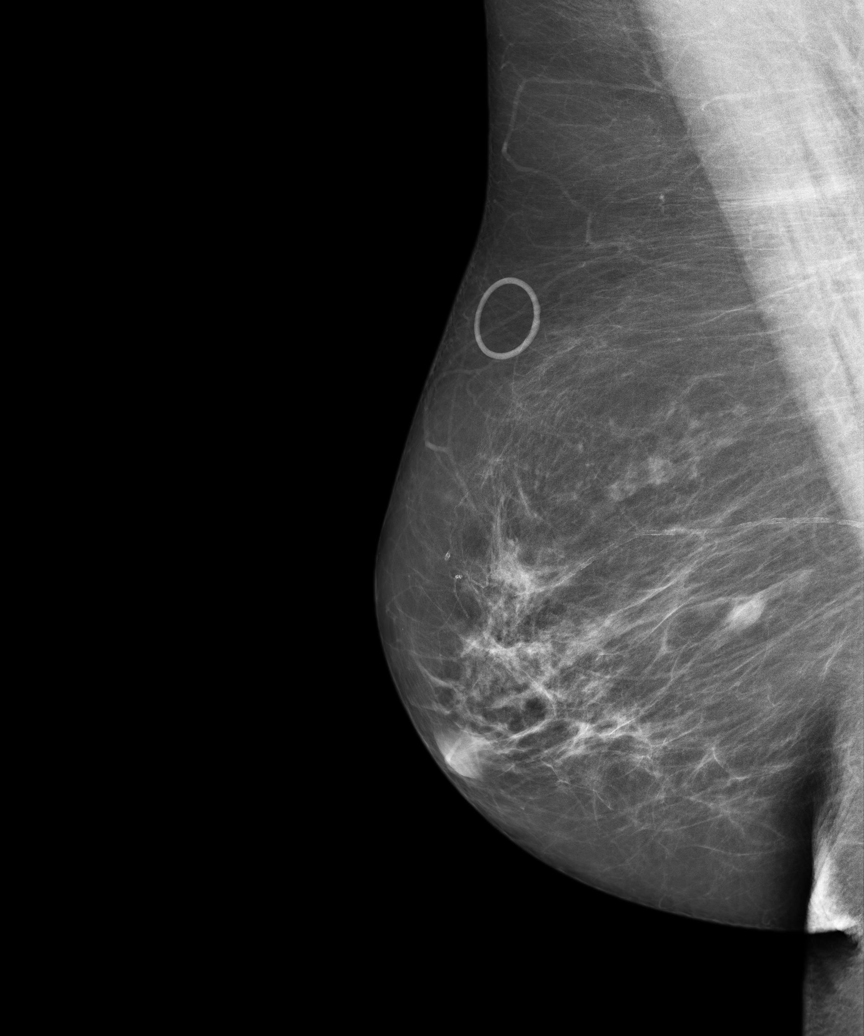
[im 4/5]
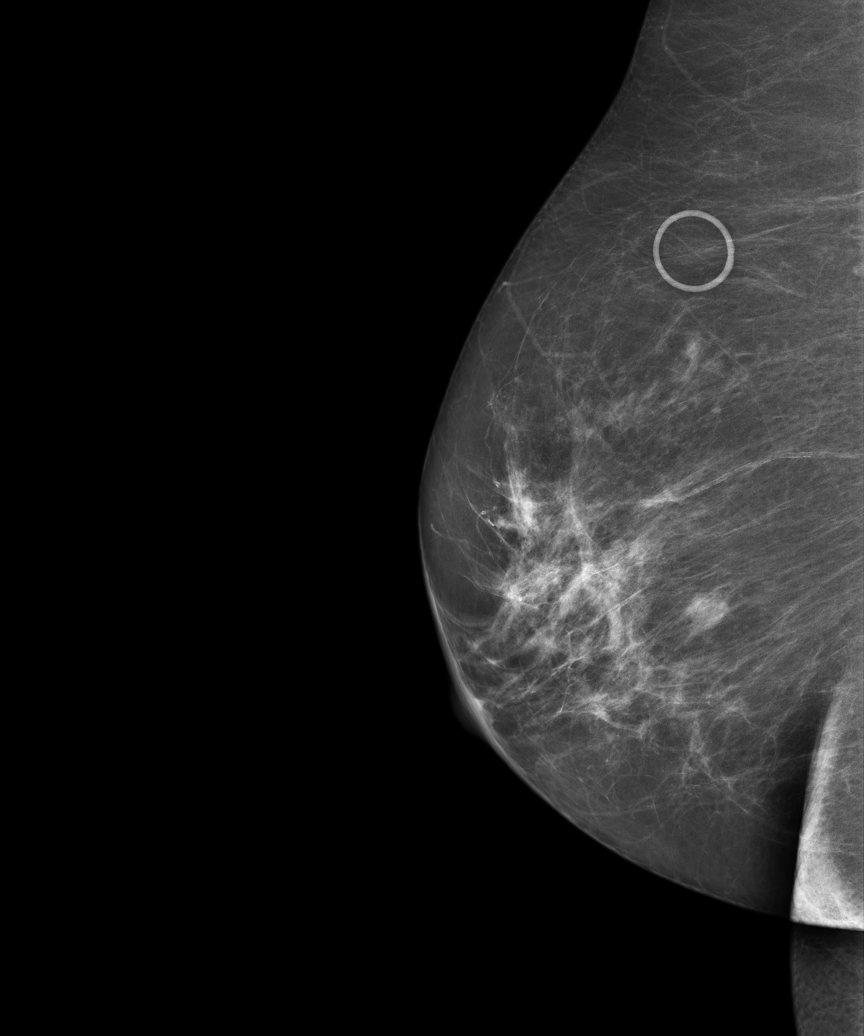
[im 5/5]
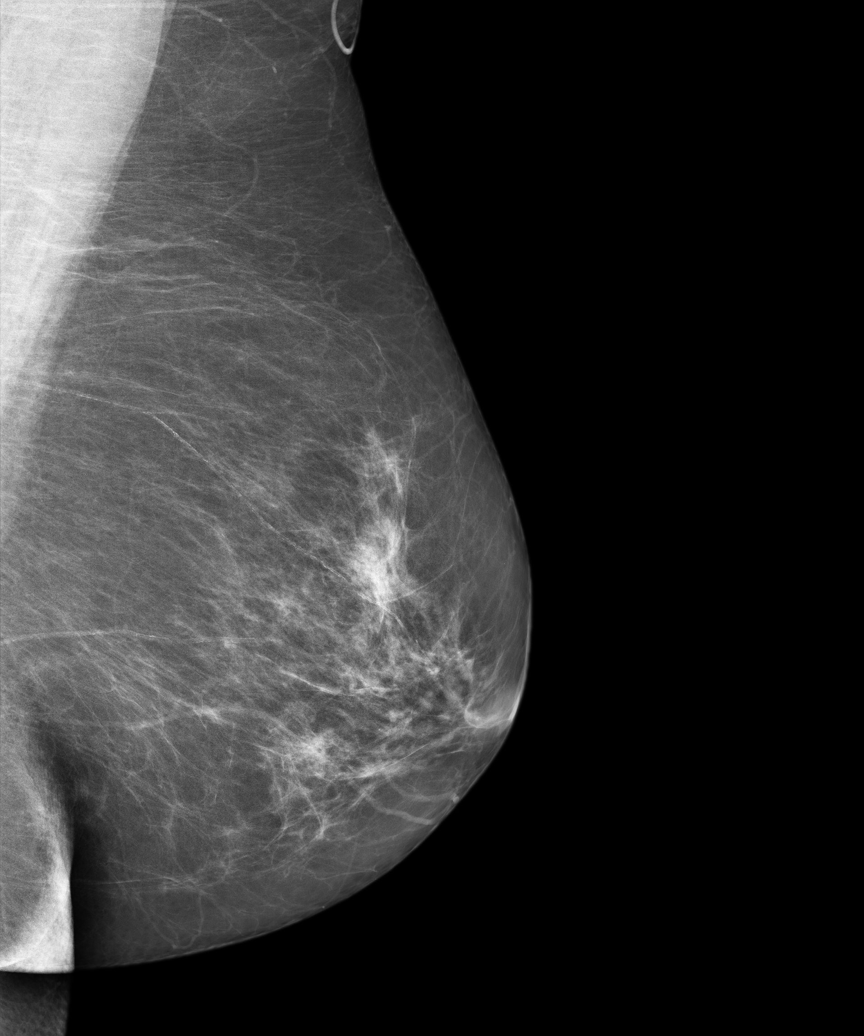

[5 of 5 positions shown; findings below may reference images not displayed]

ACR Breast Density Category b: There are scattered areas of
fibroglandular density.
FINDINGS: There are no findings suspicious for malignancy. Images were
processed with CAD. Evidence of benign right excisional biopsy
reidentified.
IMPRESSION: No mammographic evidence of malignancy. A result letter of this
screening mammogram will be mailed directly to the patient.

RECOMMENDATION:
Screening mammogram in one year. (Code:CX-0-G40)

BI-RADS CATEGORY  2: Benign.

## 2015-07-13 IMAGING — US US BREAST*R* LIMITED INC AXILLA
1 series · 2 of 2 positions shown · non-contrast
Comparison: 11/12/2014 and earlier

CLINICAL DATA: Patient returns after screening study for evaluation
of the right breast.

EXAM:
DIGITAL DIAGNOSTIC RIGHT MAMMOGRAM WITH 3D TOMOSYNTHESIS WITH CAD
ULTRASOUND RIGHT BREAST

[Series 1: us breast*right* limited inc axilla · 0.08mm/px · 2 of 2 slices shown]
[im 1/2]
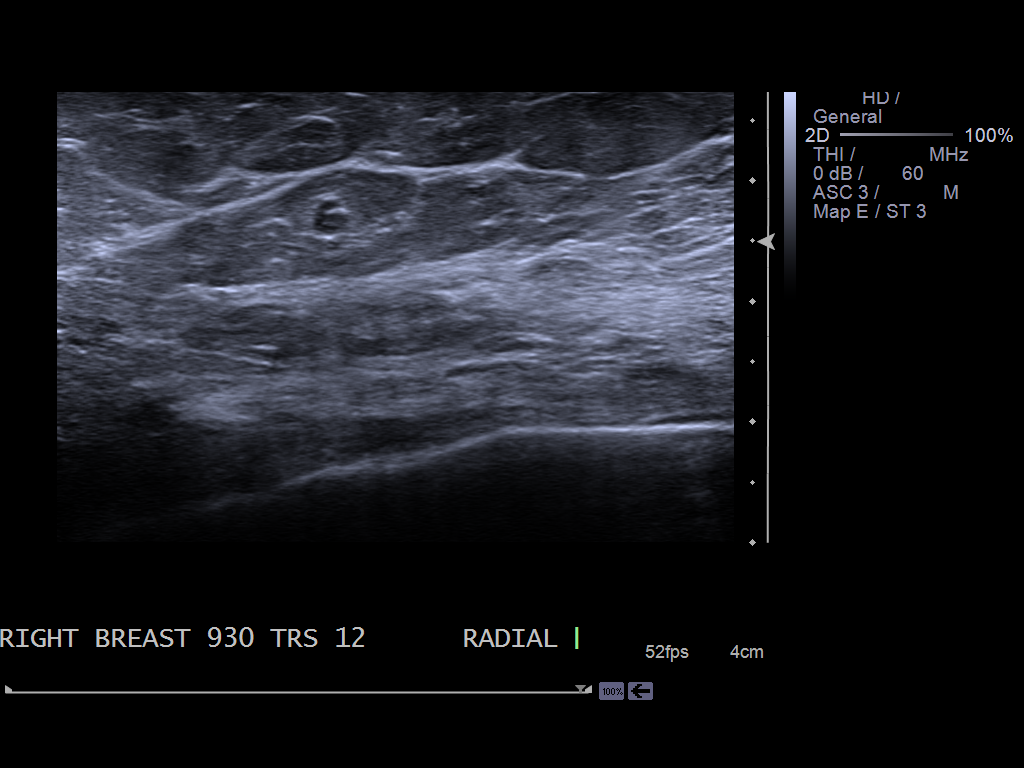
[im 2/2]
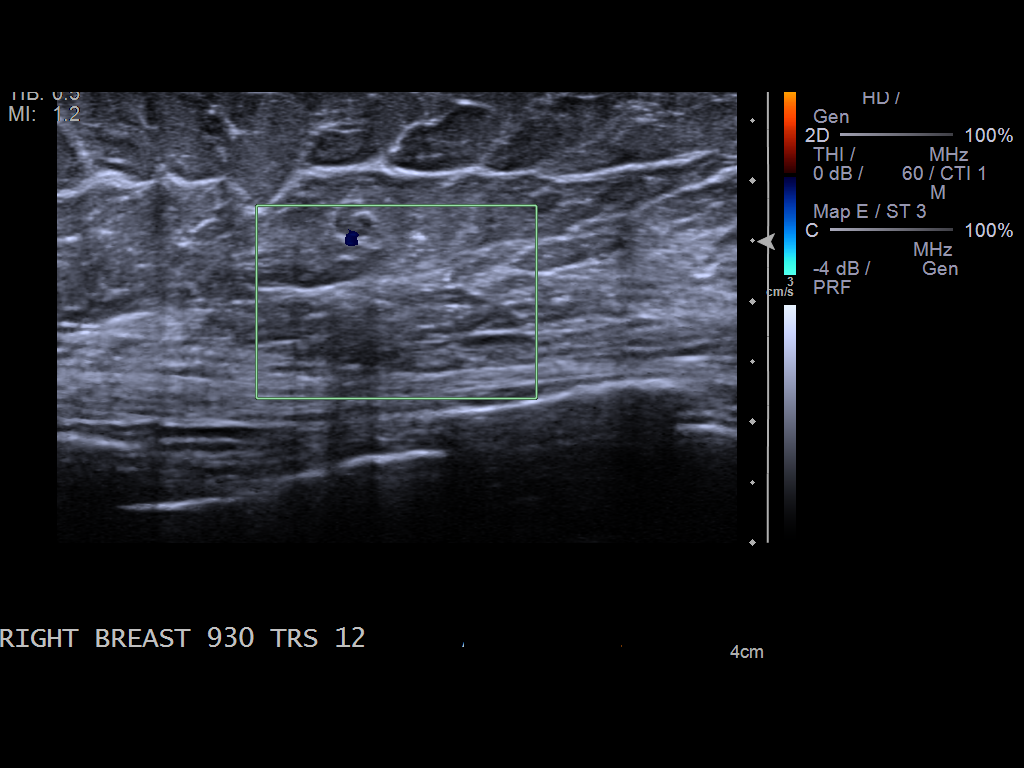

[2 of 2 positions shown; findings below may reference images not displayed]

ACR Breast Density Category b: There are scattered areas of
fibroglandular density.
FINDINGS: Additional views are performed, confirming presence of a 4 mm
circumscribed low density nodule with lucent center in the lateral
posterior portion of the right breast.

Mammographic images were processed with CAD.

On physical exam, I palpate no abnormality in the lateral aspect of
the right breast.

Targeted ultrasound is performed, showing a small hypoechoic nodule
with hyperechoic center in the 930 o'clock location of the right
breast 12 cm from the nipple which measures 0.4 x 0.3 cm. Findings
are consistent with benign intramammary lymph node.
IMPRESSION: Persistent abnormality represents a benign intramammary lymph node
by ultrasound a mammogram. No mammographic or ultrasound evidence
for malignancy.

RECOMMENDATION:
Screening mammogram in one year.(Code:[id number]-[id number])

I have discussed the findings and recommendations with the patient.
Results were also provided in writing at the conclusion of the
visit. If applicable, a reminder letter will be sent to the patient
regarding the next appointment.

BI-RADS CATEGORY  2: Benign.

## 2015-07-21 ENCOUNTER — Encounter: Payer: Self-pay | Admitting: Internal Medicine

## 2015-07-21 ENCOUNTER — Ambulatory Visit (INDEPENDENT_AMBULATORY_CARE_PROVIDER_SITE_OTHER): Payer: Medicare PPO | Admitting: Internal Medicine

## 2015-07-21 VITALS — BP 120/70 | HR 70 | Temp 98.0°F | Resp 18 | Ht 63.0 in | Wt 197.8 lb

## 2015-07-21 DIAGNOSIS — M79672 Pain in left foot: Secondary | ICD-10-CM

## 2015-07-21 DIAGNOSIS — N183 Chronic kidney disease, stage 3 unspecified: Secondary | ICD-10-CM

## 2015-07-21 DIAGNOSIS — R197 Diarrhea, unspecified: Secondary | ICD-10-CM

## 2015-07-21 DIAGNOSIS — F330 Major depressive disorder, recurrent, mild: Secondary | ICD-10-CM

## 2015-07-21 DIAGNOSIS — M79671 Pain in right foot: Secondary | ICD-10-CM

## 2015-07-21 DIAGNOSIS — E7800 Pure hypercholesterolemia, unspecified: Secondary | ICD-10-CM

## 2015-07-21 DIAGNOSIS — I10 Essential (primary) hypertension: Secondary | ICD-10-CM

## 2015-07-21 DIAGNOSIS — R519 Headache, unspecified: Secondary | ICD-10-CM

## 2015-07-21 DIAGNOSIS — R51 Headache: Secondary | ICD-10-CM | POA: Diagnosis not present

## 2015-07-21 DIAGNOSIS — K219 Gastro-esophageal reflux disease without esophagitis: Secondary | ICD-10-CM

## 2015-07-21 DIAGNOSIS — R739 Hyperglycemia, unspecified: Secondary | ICD-10-CM

## 2015-07-21 LAB — SEDIMENTATION RATE: Sed Rate: 38 mm/hr — ABNORMAL HIGH (ref 0–22)

## 2015-07-21 NOTE — Progress Notes (Signed)
Pre-visit discussion using our clinic review tool. No additional management support is needed unless otherwise documented below in the visit note.  

## 2015-07-21 NOTE — Progress Notes (Signed)
Patient ID: Victoria Hanson, female   DOB: 20-Oct-1950, 65 y.o.   MRN: [id number]   Subjective:    Patient ID: Victoria Hanson, female    DOB: [birth year], 65 y.o.   MRN: [id number]  HPI Patient with past history of colon cancer, lung cancer, hypertension, hypercholesterolemia, GERD and depression.  She was scheduled for her welcome to medicare appt.  She had several concerns she wanted to discuss today, so wanted to postpone her wellness visit.  She states she has had pain in the bottom of her right foot.  States has some nerve damage from chemotherapy.  Also has a bunion on her left foot.  She has a pair of shoes with good support and this helps.  She also reports increased pain in her temples.  States it feels as if her veins swell.  Right side is worse.  Not constant pain.  Occurs intermittently.  No known triggers.  Discussed the need to stay active.  No cardiac symptoms with increased activity or exertion.  No sob.  Taking cholestyramine.  Has helped the diarrhea.  Increased stress with her family issues.  Discussed with her today.     Past Medical History  Diagnosis Date  . Colon cancer ([place])     a. 1999: Pt had partial colectomy. Did develop lung metastasis. Had right upper lobectomy in 01/2007 then had associated chemotherapy.  . Hypercholesterolemia   . Hypertension   . Depression   . Nephrolithiasis   . Elevated transaminase level     a. ? NASH;  b. 06/2015 - nl LFTs.  . GERD (gastroesophageal reflux disease)   . Holter monitor, abnormal     a. 12/2009 Holter monitoring: NSR, rare PACs and PVCs.  . Echocardiogram abnormal     a. 02/2010 Echo: EF 55-60%, mild LAE, no regional wall motion abnormalities  . Lung cancer ([place])     a. S/P resection (lobectomy - right)  . COPD (chronic obstructive pulmonary disease) ([place])   . Anxiety   . DDD (degenerative disc disease), lumbosacral   . Hematuria   . Leaky heart valve     a. Per pt report - no evidence of valvular abnormalities on prior  echoes.  . Atypical chest pain     a. 2003 Cath: reportedly nl per pt [name]);  b. 04/2015 [place] Admission, neg troponin.  . Morbid obesity ([place])   . Hyperlipidemia    Past Surgical History  Procedure Laterality Date  . Colon surgery      Colon cancer, removed part of large colon  . Lobectomy      Right lung, upper lobe right side removed  . Cholecystectomy  2000  . Tubal ligation  1980  . Total abdominal hysterectomy      abnormal bleeding  . Breast biopsy  10/07/2009    Right breast  . Colonoscopy N/A 04/25/2015    Procedure: COLONOSCOPY;  Surgeon: Manya Silvas, MD;  Location: [facility] ENDOSCOPY;  Service: Endoscopy;  Laterality: N/A;  . Cardiac catheterization  2003    negative as per pt report   Family History  Problem Relation Age of Onset  . Coronary artery disease Mother     died @ 98 of MI.  Marland Kitchen Heart attack Father 63    MI  . Cancer Father     lung - died in his mid-70's  . Hypertension Father   . Sudden death Brother   . Mental illness Mother   . Mental illness Brother   .  Diabetes Mother   . Diabetes Father   . Heart attack Brother     MI @ 35, died @ age 23.   Social History   Social History  . Marital Status: Widowed    Spouse Name: N/A  . Number of Children: N/A  . Years of Education: N/A   Social History Main Topics  . Smoking status: Passive Smoke Exposure - Never Smoker  . Smokeless tobacco: Never Used     Comment: husband and sons smoked in her home.  . Alcohol Use: 0.6 oz/week    0 Standard drinks or equivalent, 1 Glasses of wine per week     Comment: rare wine cooler.  . Drug Use: No  . Sexual Activity: Not Asked   Other Topics Concern  . None   Social History Narrative   Widowed.   Disabled (colon/lung CA), retired.   Lives in [place] with her dtr, grand-dtr, and son.   Active.   Gets regular exercise, walks.     Review of Systems  Constitutional: Negative for appetite change and unexpected weight change.  HENT:  Negative for congestion and sinus pressure.   Eyes: Negative for discharge and redness.  Respiratory: Negative for cough, chest tightness and shortness of breath.   Cardiovascular: Negative for chest pain, palpitations and leg swelling.  Gastrointestinal: Negative for nausea, vomiting and abdominal pain.       Diarrhea has improved with cholestyramine.    Genitourinary: Negative for dysuria and difficulty urinating.  Musculoskeletal: Negative for back pain and joint swelling.  Skin: Negative for color change and rash.  Neurological: Negative for dizziness, light-headedness and headaches.  Psychiatric/Behavioral: Negative for agitation.       Increased stress as outlined.         Objective:    Physical Exam  Constitutional: She appears well-developed and well-nourished. No distress.  HENT:  Nose: Nose normal.  Mouth/Throat: Oropharynx is clear and moist.  Eyes: Conjunctivae are normal. Right eye exhibits no discharge. Left eye exhibits no discharge.  Neck: Neck supple. No thyromegaly present.  Cardiovascular: Normal rate and regular rhythm.   Pulmonary/Chest: Breath sounds normal. No respiratory distress. She has no wheezes.  Abdominal: Soft. Bowel sounds are normal. There is no tenderness.  Musculoskeletal: She exhibits no edema or tenderness.  Lymphadenopathy:    She has no cervical adenopathy.  Skin: No rash noted. No erythema.  Psychiatric: She has a normal mood and affect. Her behavior is normal.    BP 120/70 mmHg  Pulse 70  Temp(Src) 98 F (36.7 C) (Oral)  Resp 18  Ht 5\' 3"  (1.6 m)  Wt 197 lb 12 oz (89.699 kg)  BMI 35.04 kg/m2  SpO2 98% Wt Readings from Last 3 Encounters:  07/21/15 197 lb 12 oz (89.699 kg)  06/21/15 201 lb 4 oz (91.286 kg)  06/17/15 202 lb 6 oz (91.797 kg)     Lab Results  Component Value Date   WBC 7.1 05/17/2015   HGB 12.1 05/17/2015   HCT 36.0 05/17/2015   PLT 246 05/17/2015   GLUCOSE 103* 06/16/2015   CHOL 200 06/16/2015   TRIG 143.0  06/16/2015   HDL 50.30 06/16/2015   LDLDIRECT 163.4 12/31/2012   LDLCALC 121* 06/16/2015   ALT 17 06/16/2015   AST 19 06/16/2015   NA 140 06/16/2015   K 4.2 06/16/2015   CL 106 06/16/2015   CREATININE 1.17 06/16/2015   BUN 25* 06/16/2015   CO2 28 06/16/2015   TSH 3.393 05/04/2015  INR 1.0 12/12/2012   HGBA1C 6.0 06/16/2015    Nm Myocar Multi W/spect W/wall Motion / Ef  06/28/2015   Blood pressure demonstrated a hypertensive response to exercise.  Horizontal ST segment depression ST segment depression was noted during stress in the V3, V4, V5 and V6 leads.  Pharmacological myocardial perfusion imaging study with no significant  ischemia Normal wall motion, EF estimated at 40% (depressed EF likely secondary to GI uptake artifact and inferior wall attenuation) No EKG changes concerning for ischemia. Low risk scan Signed, Esmond Plants, MD [facility]   06/28/2015   Blood pressure demonstrated a hypertensive response to exercise.  Horizontal ST segment depression ST segment depression was noted during stress in the V3, V4, V5 and V6 leads.        Assessment & Plan:   Problem List Items Addressed This Visit    Acid reflux    On omeprazole.  Symptoms controlled.        CKD (chronic kidney disease), stage III    Cr recently improved and relatively stable.  Follow.  Stay hydrated.        Depression, major, recurrent ([place])    Increased stress as outlined.  Has trazodone to help her sleep.  Not using xanax now.  Does not feel needs any further intervention at this time.  Follow.       Diarrhea    Diarrhea has improved on cholestyramine.  Follow.        Essential (primary) hypertension    Blood pressure doing well.  Follow.  Check metabolic panel.        Foot pain, bilateral    Discussed supports.  Dicussed medication.  Discussed referral to podiatry.  Supports.        Head pain - Primary    Intermittent.  Localized over her temples.  Check esr.  May need neurology  evaluation.  Question if neuropathic pain.        Relevant Orders   Sed Rate (ESR) (Completed)   Hypercholesterolemia    Low cholesterol diet and exercise.  Had issues with her liver previously.  Have held on retrying a statin medication.  On cholestyramine.  Follow.   Lab Results  Component Value Date   CHOL 200 06/16/2015   HDL 50.30 06/16/2015   LDLCALC 121* 06/16/2015   LDLDIRECT 163.4 12/31/2012   TRIG 143.0 06/16/2015   CHOLHDL 4 06/16/2015        Hyperglycemia    Low carb diet and exercise.  Follow met b and a1c.            Einar Pheasant, MD

## 2015-07-22 ENCOUNTER — Other Ambulatory Visit: Payer: Self-pay | Admitting: Internal Medicine

## 2015-07-22 DIAGNOSIS — R51 Headache: Principal | ICD-10-CM

## 2015-07-22 DIAGNOSIS — R519 Headache, unspecified: Secondary | ICD-10-CM

## 2015-07-22 NOTE — Progress Notes (Signed)
Order placed for neurology referral.   

## 2015-07-24 ENCOUNTER — Encounter: Payer: Self-pay | Admitting: Internal Medicine

## 2015-07-24 DIAGNOSIS — R519 Headache, unspecified: Secondary | ICD-10-CM | POA: Insufficient documentation

## 2015-07-24 DIAGNOSIS — M79671 Pain in right foot: Secondary | ICD-10-CM | POA: Insufficient documentation

## 2015-07-24 DIAGNOSIS — M79672 Pain in left foot: Secondary | ICD-10-CM

## 2015-07-24 DIAGNOSIS — R51 Headache: Secondary | ICD-10-CM

## 2015-07-24 NOTE — Assessment & Plan Note (Signed)
On omeprazole.  Symptoms controlled.  

## 2015-07-24 NOTE — Assessment & Plan Note (Signed)
Cr recently improved and relatively stable.  Follow.  Stay hydrated.

## 2015-07-24 NOTE — Assessment & Plan Note (Signed)
Blood pressure doing well.  Follow.  Check metabolic panel.

## 2015-07-24 NOTE — Assessment & Plan Note (Signed)
Intermittent.  Localized over her temples.  Check esr.  May need neurology evaluation.  Question if neuropathic pain.

## 2015-07-24 NOTE — Assessment & Plan Note (Addendum)
Low carb diet and exercise.  Follow met b and a1c.   

## 2015-07-24 NOTE — Assessment & Plan Note (Signed)
Increased stress as outlined.  Has trazodone to help her sleep.  Not using xanax now.  Does not feel needs any further intervention at this time.  Follow.

## 2015-07-24 NOTE — Assessment & Plan Note (Signed)
Diarrhea has improved on cholestyramine.  Follow.

## 2015-07-24 NOTE — Assessment & Plan Note (Signed)
Discussed supports.  Dicussed medication.  Discussed referral to podiatry.  Supports.

## 2015-07-24 NOTE — Assessment & Plan Note (Signed)
Low cholesterol diet and exercise.  Had issues with her liver previously.  Have held on retrying a statin medication.  On cholestyramine.  Follow.   Lab Results  Component Value Date   CHOL 200 06/16/2015   HDL 50.30 06/16/2015   LDLCALC 121* 06/16/2015   LDLDIRECT 163.4 12/31/2012   TRIG 143.0 06/16/2015   CHOLHDL 4 06/16/2015

## 2015-07-26 ENCOUNTER — Ambulatory Visit: Payer: Medicare PPO | Admitting: Nurse Practitioner

## 2015-08-01 ENCOUNTER — Encounter: Payer: Self-pay | Admitting: Radiology

## 2015-08-01 ENCOUNTER — Emergency Department: Payer: Medicare PPO

## 2015-08-01 DIAGNOSIS — Z7982 Long term (current) use of aspirin: Secondary | ICD-10-CM | POA: Diagnosis not present

## 2015-08-01 DIAGNOSIS — Z79899 Other long term (current) drug therapy: Secondary | ICD-10-CM | POA: Insufficient documentation

## 2015-08-01 DIAGNOSIS — N183 Chronic kidney disease, stage 3 (moderate): Secondary | ICD-10-CM | POA: Diagnosis not present

## 2015-08-01 DIAGNOSIS — I129 Hypertensive chronic kidney disease with stage 1 through stage 4 chronic kidney disease, or unspecified chronic kidney disease: Secondary | ICD-10-CM | POA: Insufficient documentation

## 2015-08-01 DIAGNOSIS — R51 Headache: Secondary | ICD-10-CM | POA: Insufficient documentation

## 2015-08-01 LAB — BASIC METABOLIC PANEL
Anion gap: 9 (ref 5–15)
BUN: 23 mg/dL — ABNORMAL HIGH (ref 6–20)
CHLORIDE: 106 mmol/L (ref 101–111)
CO2: 24 mmol/L (ref 22–32)
CREATININE: 1.25 mg/dL — AB (ref 0.44–1.00)
Calcium: 9.2 mg/dL (ref 8.9–10.3)
GFR calc non Af Amer: 44 mL/min — ABNORMAL LOW (ref >60)
GFR, EST AFRICAN AMERICAN: 51 mL/min — AB (ref >60)
Glucose, Bld: 126 mg/dL — ABNORMAL HIGH (ref 65–99)
Potassium: 4 mmol/L (ref 3.5–5.1)
Sodium: 139 mmol/L (ref 135–145)

## 2015-08-01 LAB — CBC
HEMATOCRIT: 36.8 % (ref 35.0–47.0)
HEMOGLOBIN: 12 g/dL (ref 12.0–16.0)
MCH: 27.6 pg (ref 26.0–34.0)
MCHC: 32.5 g/dL (ref 32.0–36.0)
MCV: 84.7 fL (ref 80.0–100.0)
Platelets: 236 10*3/uL (ref 150–440)
RBC: 4.34 MIL/uL (ref 3.80–5.20)
RDW: 13.4 % (ref 11.5–14.5)
WBC: 11.3 10*3/uL — AB (ref 3.6–11.0)

## 2015-08-01 LAB — SEDIMENTATION RATE: Sed Rate: 49 mm/hr — ABNORMAL HIGH (ref 0–30)

## 2015-08-01 NOTE — ED Notes (Addendum)
Patient ambulatory to triage with steady gait, without difficulty or distress noted; pt reports HA to temples for several months, has appointment with neurologist 3/7 but st pain persists; denies any accomp symptoms

## 2015-08-02 ENCOUNTER — Emergency Department
Admission: EM | Admit: 2015-08-02 | Discharge: 2015-08-02 | Disposition: A | Discharge status: Home / Self Care | Payer: Medicare PPO | Attending: Emergency Medicine | Admitting: Emergency Medicine

## 2015-08-02 DIAGNOSIS — R51 Headache: Secondary | ICD-10-CM

## 2015-08-02 DIAGNOSIS — R519 Headache, unspecified: Secondary | ICD-10-CM

## 2015-08-02 MED ORDER — GABAPENTIN 100 MG PO CAPS: 100.0000 mg | ORAL_CAPSULE | Freq: Two times a day (BID) | ORAL | Status: DC

## 2015-08-02 NOTE — ED Provider Notes (Signed)
[facility] Emergency Department Provider Note  ____________________________________________  Time seen: Approximately 1:57 AM  I have reviewed the triage vital signs and the nursing notes.   HISTORY  Chief Complaint Headache    HPI Victoria Hanson is a 65 y.o. female presents by private vehicle for evaluation of a headache that she has had intermittently for 1-1/2-2 months.  She has seen her primary care doctor, Dr. Nicki Reaper, who has worked her up without any specific diagnosis and referred her to neurology; she has an appointment with Dr. Manuella Ghazi in about 2 weeks.She describes the headache as a pressure in both temples that is intermittent but severe at its worst and sometimes completely resolves.  She states it is usually worse at the end of the day.  She is tender to palpation of both sides of her head.  She has not had any visual changes or disturbances.  She denies nausea and vomiting, chest pain, shortness of breath, fever/chills, abdominal pain, dysuria.  Nothing in particular makes the pain better and palpation makes it worse.  It has been gradual in onset and consistent, as stated above, for nearly 2 months.   Past Medical History  Diagnosis Date  . Colon cancer ([place])     a. 1999: Pt had partial colectomy. Did develop lung metastasis. Had right upper lobectomy in 01/2007 then had associated chemotherapy.  . Hypercholesterolemia   . Hypertension   . Depression   . Nephrolithiasis   . Elevated transaminase level     a. ? NASH;  b. 06/2015 - nl LFTs.  . GERD (gastroesophageal reflux disease)   . Holter monitor, abnormal     a. 12/2009 Holter monitoring: NSR, rare PACs and PVCs.  . Echocardiogram abnormal     a. 02/2010 Echo: EF 55-60%, mild LAE, no regional wall motion abnormalities  . Lung cancer ([place])     a. S/P resection (lobectomy - right)  . COPD (chronic obstructive pulmonary disease) ([place])   . Anxiety   . DDD (degenerative disc disease), lumbosacral    . Hematuria   . Leaky heart valve     a. Per pt report - no evidence of valvular abnormalities on prior echoes.  . Atypical chest pain     a. 2003 Cath: reportedly nl per pt [name]);  b. 04/2015 [place] Admission, neg troponin.  . Morbid obesity ([place])   . Hyperlipidemia     Patient Active Problem List   Diagnosis Date Noted  . Foot pain, bilateral 07/24/2015  . Head pain 07/24/2015  . Hyperlipidemia 06/21/2015  . Atypical chest pain 06/21/2015  . Other fatigue 06/21/2015  . Morbid obesity due to excess calories ([place]) 06/21/2015  . Morbid obesity ([place])   . Abnormal mammogram 06/19/2015  . Nasal congestion 05/15/2015  . Chest pain 05/04/2015  . Neck pain 04/13/2015  . Acute maxillary sinusitis 04/08/2015  . Urinary system disease 03/28/2015  . Hyperglycemia 03/20/2015  . Diarrhea 02/03/2015  . Right lower quadrant abdominal pain 02/03/2015  . Wound, open, forearm 12/29/2014  . Swelling of lower extremity 11/20/2014  . H/O malignant neoplasm of colon 11/19/2014  . Loose stools 11/14/2014  . Dysuria 10/24/2014  . Flatulence, eructation and gas pain 10/24/2014  . Health care maintenance 07/17/2014  . Hypokalemia 04/05/2013  . TIA (transient ischemic attack) 02/09/2013  . Temporary cerebral vascular dysfunction 02/09/2013  . Chest pain at rest 05/07/2012  . Anxiety, mild 03/13/2012  . Depression, major, recurrent ([place]) 03/13/2012  . Lung cancer ([place])  03/13/2012  . Colon cancer ([place]) 03/13/2012  . Hypertension 03/13/2012  . CKD (chronic kidney disease), stage III 03/13/2012  . GERD (gastroesophageal reflux disease) 03/13/2012  . Acid reflux 03/13/2012  . Essential (primary) hypertension 03/13/2012  . Cancer of lung ([place]) 03/13/2012  . Major depressive disorder with single episode ([place]) 03/13/2012  . Malignant neoplasm of colon ([place]) 03/13/2012  . Hypercholesterolemia 02/02/2010  . Pure hypercholesterolemia 02/02/2010    Past Surgical History  Procedure Laterality  Date  . Colon surgery      Colon cancer, removed part of large colon  . Lobectomy      Right lung, upper lobe right side removed  . Cholecystectomy  2000  . Tubal ligation  1980  . Total abdominal hysterectomy      abnormal bleeding  . Breast biopsy  10/07/2009    Right breast  . Colonoscopy N/A 04/25/2015    Procedure: COLONOSCOPY;  Surgeon: Manya Silvas, MD;  Location: [facility] ENDOSCOPY;  Service: Endoscopy;  Laterality: N/A;  . Cardiac catheterization  2003    negative as per pt report    Current Outpatient Rx  Name  Route  Sig  Dispense  Refill  . albuterol (PROVENTIL HFA;VENTOLIN HFA) 108 (90 Base) MCG/ACT inhaler   Inhalation   Inhale 2 puffs into the lungs every 6 (six) hours as needed for wheezing.         Marland Kitchen ALPRAZolam (XANAX) 0.5 MG tablet   Oral   Take 1 tablet (0.5 mg total) by mouth daily as needed for anxiety.   60 tablet   0   . aspirin EC 325 MG tablet   Oral   Take 1 tablet (325 mg total) by mouth daily.   30 tablet   0   . colestipol (COLESTID) 1 g tablet   Oral   Take 1 g by mouth 4 (four) times daily.         . mometasone (NASONEX) 50 MCG/ACT nasal spray   Nasal   Place 2 sprays into the nose daily.   17 g   6   . Multiple Vitamin (MULTIVITAMIN) tablet   Oral   Take 1 tablet by mouth daily.         Marland Kitchen omeprazole (PRILOSEC) 20 MG capsule   Oral   Take 20 mg by mouth daily.         . potassium chloride SA (K-DUR,KLOR-CON) 20 MEQ tablet   Oral   Take 40 mEq by mouth at bedtime.         . traZODone (DESYREL) 50 MG tablet   Oral   Take 125 mg by mouth at bedtime.         . triamterene-hydrochlorothiazide (MAXZIDE-25) 37.5-25 MG tablet   Oral   Take 1 tablet by mouth daily.         Marland Kitchen gabapentin (NEURONTIN) 100 MG capsule   Oral   Take 1 capsule (100 mg total) by mouth 2 (two) times daily.   60 capsule   0     Allergies Macrobid; Antihistamines, diphenhydramine-type; Aspirin; Ceftin; Celecoxib; Cymbalta;  Diphenhydramine; Escitalopram; Lexapro; Metronidazole; Zoloft; and Ciprofloxacin  Family History  Problem Relation Age of Onset  . Coronary artery disease Mother     died @ 68 of MI.  Marland Kitchen Heart attack Father 37    MI  . Cancer Father     lung - died in his mid-70's  . Hypertension Father   . Sudden death Brother   . Mental  illness Mother   . Mental illness Brother   . Diabetes Mother   . Diabetes Father   . Heart attack Brother     MI @ 28, died @ age 24.    Social History Social History  Substance Use Topics  . Smoking status: Passive Smoke Exposure - Never Smoker  . Smokeless tobacco: Never Used     Comment: husband and sons smoked in her home.  . Alcohol Use: 0.6 oz/week    0 Standard drinks or equivalent, 1 Glasses of wine per week     Comment: rare wine cooler.    Review of Systems Constitutional: No fever/chills Eyes: No visual changes. ENT: No sore throat. Cardiovascular: Denies chest pain. Respiratory: Denies shortness of breath. Gastrointestinal: No abdominal pain.  No nausea, no vomiting.  No diarrhea.  No constipation. Genitourinary: Negative for dysuria. Musculoskeletal: Negative for back pain. Skin: Negative for rash. Neurological: Bilateral temporal headache for about 2 months with tenderness to palpation  10-point ROS otherwise negative.  ____________________________________________   PHYSICAL EXAM:  VITAL SIGNS: ED Triage Vitals  Enc Vitals Group     BP 08/01/15 2101 129/79 mmHg     Pulse Rate 08/01/15 2101 69     Resp 08/01/15 2101 20     Temp 08/01/15 2101 98.1 F (36.7 C)     Temp src --      SpO2 08/01/15 2101 97 %     Weight 08/01/15 2101 197 lb (89.359 kg)     Height 08/01/15 2101 _0  (1.6 m)     Head Cir --      Peak Flow --      Pain Score 08/01/15 2101 6     Pain Loc --      Pain Edu? --      Excl. in [place]? --     Constitutional: Alert and oriented. Well appearing and in no acute distress. Eyes: Conjunctivae are normal.  PERRL. EOMI. no papilledema on funduscopic exam. Head: Atraumatic.  Very mild tenderness to palpation of bilateral temples.  No lesions or erythema. Nose: No congestion/rhinnorhea. Mouth/Throat: Mucous membranes are moist.  Oropharynx non-erythematous. Neck: No stridor.   Cardiovascular: Normal rate, regular rhythm. Grossly normal heart sounds.  Good peripheral circulation. Respiratory: Normal respiratory effort.  No retractions. Lungs CTAB. Gastrointestinal: Soft and nontender. No distention. No abdominal bruits. No CVA tenderness. Musculoskeletal: No lower extremity tenderness nor edema.  No joint effusions. Neurologic:  Normal speech and language. No gross focal neurologic deficits are appreciated.  Skin:  Skin is warm, dry and intact. No rash noted.   ____________________________________________   LABS (all labs ordered are listed, but only abnormal results are displayed)  Labs Reviewed  CBC - Abnormal; Notable for the following:    WBC 11.3 (*)    All other components within normal limits  BASIC METABOLIC PANEL - Abnormal; Notable for the following:    Glucose, Bld 126 (*)    BUN 23 (*)    Creatinine, Ser 1.25 (*)    GFR calc non Af Amer 44 (*)    GFR calc Af Amer 51 (*)    All other components within normal limits  SEDIMENTATION RATE - Abnormal; Notable for the following:    Sed Rate 49 (*)    All other components within normal limits   ____________________________________________  EKG  None ____________________________________________  RADIOLOGY   Ct Head Wo Contrast  08/01/2015  CLINICAL DATA:  Chronic bilateral headache.  Initial encounter. EXAM: CT HEAD  WITHOUT CONTRAST TECHNIQUE: Contiguous axial images were obtained from the base of the skull through the vertex without intravenous contrast. COMPARISON:  MRI of the brain performed 02/13/2013, and CT of the head performed 10/27/2010 FINDINGS: There is no evidence of acute infarction, mass lesion, or intra- or  extra-axial hemorrhage on CT. Mild cerebellar atrophy is suggested. Mild periventricular white matter change likely reflects small vessel ischemic microangiopathy. The brainstem and fourth ventricle are within normal limits. The third and lateral ventricles, and basal ganglia are unremarkable in appearance. The cerebral hemispheres are symmetric in appearance, with normal gray-white differentiation. No mass effect or midline shift is seen. There is no evidence of fracture; visualized osseous structures are unremarkable in appearance. The visualized portions of the orbits are within normal limits. The paranasal sinuses and mastoid air cells are well-aerated. No significant soft tissue abnormalities are seen. IMPRESSION: 1. No acute intracranial pathology seen on CT. 2. Mild small vessel ischemic microangiopathy. Suggestion of mild cerebellar atrophy. Electronically Signed   By: Garald Balding M.D.   On: 08/01/2015 21:42    ____________________________________________   PROCEDURES  Procedure(s) performed: None  Critical Care performed: No ____________________________________________   INITIAL IMPRESSION / ASSESSMENT AND PLAN / ED COURSE  Pertinent labs & imaging results that were available during my care of the patient were reviewed by me and considered in my medical decision making (see chart for details).  CT scan reassuring.  Lab results are all reassuring.  Sedimentation rate is very slightly above normal limits but is not consistent with temporal arteritis, and her ESR as an outpatient was also at the upper limit of normal.  I do not believe that her symptoms represent bilateral temporal arteritis and I feel that starting her on high-dose prednisone has more risks than waiting until follow-up with neurology.  I agree with Dr. Bary Leriche assessment in her clinic note that this is likely neuropathic pain.  I agreed to start the patient on gabapentin so she will perhaps have some relief before seeing  the neurologist in 2 weeks.  She and her daughter agree with this plan.  Alert and appropriate and immediately began taking off all her monitoring equipment when I told her that I was going to send her home.  ____________________________________________  FINAL CLINICAL IMPRESSION(S) / ED DIAGNOSES  Final diagnoses:  Left temporal headache  Right temporal headache      NEW MEDICATIONS STARTED DURING THIS VISIT:  New Prescriptions   GABAPENTIN (NEURONTIN) 100 MG CAPSULE    Take 1 capsule (100 mg total) by mouth 2 (two) times daily.     Hinda Kehr, MD 08/02/15 [phone]

## 2015-08-02 NOTE — Discharge Instructions (Signed)
As we discussed, your workup today was reassuring.  Though we do not know exactly what is causing your symptoms, it appears that you have no emergent medical condition at this time are safe to go home and follow up as recommended in this paperwork.  Try taking the prescribed medication and let your neurologist know how it worked for you.  He may wish to change the dose or discontinue the medication entirely.  Please return immediately to the Emergency Department if you develop any new or worsening symptoms that concern you.   General Headache Without Cause A headache is pain or discomfort felt around the head or neck area. The specific cause of a headache may not be found. There are many causes and types of headaches. A few common ones are:  Tension headaches.  Migraine headaches.  Cluster headaches.  Chronic daily headaches. HOME CARE INSTRUCTIONS  Watch your condition for any changes. Take these steps to help with your condition: Managing Pain  Take over-the-counter and prescription medicines only as told by your health care provider.  Lie down in a dark, quiet room when you have a headache.  If directed, apply ice to the head and neck area:  Put ice in a plastic bag.  Place a towel between your skin and the bag.  Leave the ice on for 20 minutes, 2-3 times per day.  Use a heating pad or hot shower to apply heat to the head and neck area as told by your health care provider.  Keep lights dim if bright lights bother you or make your headaches worse. Eating and Drinking  Eat meals on a regular schedule.  Limit alcohol use.  Decrease the amount of caffeine you drink, or stop drinking caffeine. General Instructions  Keep all follow-up visits as told by your health care provider. This is important.  Keep a headache journal to help find out what may trigger your headaches. For example, write down:  What you eat and drink.  How much sleep you get.  Any change to your diet  or medicines.  Try massage or other relaxation techniques.  Limit stress.  Sit up straight, and do not tense your muscles.  Do not use tobacco products, including cigarettes, chewing tobacco, or e-cigarettes. If you need help quitting, ask your health care provider.  Exercise regularly as told by your health care provider.  Sleep on a regular schedule. Get 7-9 hours of sleep, or the amount recommended by your health care provider. SEEK MEDICAL CARE IF:   Your symptoms are not helped by medicine.  You have a headache that is different from the usual headache.  You have nausea or you vomit.  You have a fever. SEEK IMMEDIATE MEDICAL CARE IF:   Your headache becomes severe.  You have repeated vomiting.  You have a stiff neck.  You have a loss of vision.  You have problems with speech.  You have pain in the eye or ear.  You have muscular weakness or loss of muscle control.  You lose your balance or have trouble walking.  You feel faint or pass out.  You have confusion.   This information is not intended to replace advice given to you by your health care provider. Make sure you discuss any questions you have with your health care provider.   Document Released: 05/28/2005 Document Revised: 02/16/2015 Document Reviewed: 09/20/2014 Elsevier Interactive Patient Education [employer].

## 2015-08-03 ENCOUNTER — Ambulatory Visit (INDEPENDENT_AMBULATORY_CARE_PROVIDER_SITE_OTHER): Payer: Medicare PPO | Admitting: Family Medicine

## 2015-08-03 ENCOUNTER — Ambulatory Visit: Payer: Medicare PPO | Admitting: Family Medicine

## 2015-08-03 ENCOUNTER — Encounter: Payer: Self-pay | Admitting: Family Medicine

## 2015-08-03 VITALS — BP 132/76 | HR 72 | Temp 98.3°F | Ht 63.0 in | Wt 200.0 lb

## 2015-08-03 DIAGNOSIS — J3489 Other specified disorders of nose and nasal sinuses: Secondary | ICD-10-CM | POA: Insufficient documentation

## 2015-08-03 NOTE — Progress Notes (Signed)
Subjective:  Patient ID: Victoria Hanson, female    DOB: 1950/09/30  Age: 65 y.o. MRN: [id number]  CC: Concern for sinus infection  HPI:  65 year old female with a complicated PMH presents with concerns for sinus infection.  Patient reports a 4 day history of sinus pressure and blood-tinged mucus when blowing the nose. No associated fevers or chills. No exacerbating or relieving factors. She is concerned she has a sinus infection. She is requesting antibiotics today. Of note, she was seen in the emergency department last night for headache. She had a CT at that time which was negative. Sinuses were normal.  Social Hx   Social History   Social History  . Marital Status: Widowed    Spouse Name: N/A  . Number of Children: N/A  . Years of Education: N/A   Social History Main Topics  . Smoking status: Passive Smoke Exposure - Never Smoker  . Smokeless tobacco: Never Used     Comment: husband and sons smoked in her home.  . Alcohol Use: 0.6 oz/week    0 Standard drinks or equivalent, 1 Glasses of wine per week     Comment: rare wine cooler.  . Drug Use: No  . Sexual Activity: Not Asked   Other Topics Concern  . None   Social History Narrative   Widowed.   Disabled (colon/lung CA), retired.   Lives in [place] with her dtr, grand-dtr, and son.   Active.   Gets regular exercise, walks.   Review of Systems  Constitutional: Negative for fever.  HENT: Positive for sinus pressure.    Objective:  BP 132/76 mmHg  Pulse 72  Temp(Src) 98.3 F (36.8 C) (Oral)  Ht '5\' 3"'$  (1.6 m)  Wt 200 lb (90.719 kg)  BMI 35.44 kg/m2  SpO2 97%  BP/Weight 08/03/2015 08/02/2015 07/05/5807  Systolic BP 983 382 -  Diastolic BP 76 75 -  Wt. (Lbs) 200 - 197  BMI 35.44 34.91 -   Physical Exam  Constitutional: She is oriented to person, place, and time. She appears well-developed. No distress.  HENT:  Head: Normocephalic and atraumatic.  Mouth/Throat: Oropharynx is clear and moist.  Normal  TM's bilaterally.   Cardiovascular: Normal rate and regular rhythm.   No murmur heard. Pulmonary/Chest: Effort normal and breath sounds normal.  Neurological: She is alert and oriented to person, place, and time.  Psychiatric: She has a normal mood and affect.  Vitals reviewed.  Lab Results  Component Value Date   WBC 11.3* 08/01/2015   HGB 12.0 08/01/2015   HCT 36.8 08/01/2015   PLT 236 08/01/2015   GLUCOSE 126* 08/01/2015   CHOL 200 06/16/2015   TRIG 143.0 06/16/2015   HDL 50.30 06/16/2015   LDLDIRECT 163.4 12/31/2012   LDLCALC 121* 06/16/2015   ALT 17 06/16/2015   AST 19 06/16/2015   NA 139 08/01/2015   K 4.0 08/01/2015   CL 106 08/01/2015   CREATININE 1.25* 08/01/2015   BUN 23* 08/01/2015   CO2 24 08/01/2015   TSH 3.393 05/04/2015   INR 1.0 12/12/2012   HGBA1C 6.0 06/16/2015    Assessment & Plan:   Problem List Items Addressed This Visit    Sinus pressure - Primary    New acute problem. CT was done last night. Normal sinuses. Normal exam today. She does not have a bacterial sinus infection. Advised OTC Nasal saline.         Follow-up: Return if symptoms worsen or fail to improve.  [place]

## 2015-08-03 NOTE — Patient Instructions (Signed)
You do not have a bacterial sinus infection as your recent CT was normal.  This is likely viral and worsened by dry air.  Use over the counter ocean spray (saline) twice daily.  Follow up as needed.  Take care  Dr. Lacinda Axon

## 2015-08-03 NOTE — Progress Notes (Signed)
Pre visit review using our clinic review tool, if applicable. No additional management support is needed unless otherwise documented below in the visit note. 

## 2015-08-03 NOTE — Assessment & Plan Note (Signed)
New acute problem. CT was done last night. Normal sinuses. Normal exam today. She does not have a bacterial sinus infection. Advised OTC Nasal saline.

## 2015-08-22 ENCOUNTER — Other Ambulatory Visit: Payer: Self-pay | Admitting: Neurology

## 2015-08-22 DIAGNOSIS — G4489 Other headache syndrome: Secondary | ICD-10-CM

## 2015-08-22 DIAGNOSIS — R42 Dizziness and giddiness: Secondary | ICD-10-CM

## 2015-08-31 ENCOUNTER — Encounter: Payer: Self-pay | Admitting: *Deleted

## 2015-08-31 ENCOUNTER — Other Ambulatory Visit: Payer: Self-pay | Admitting: *Deleted

## 2015-08-31 NOTE — Patient Outreach (Signed)
[facility]) Care Management  08/31/2015  Victoria Hanson 01-22-1951 [id number]   Referral for higher ED utilizer patient; was referred to [facility].  Initial call to patient by telephonic care coordinator. Patient was advised of reason for call & of [facility] care management services. HIPPA verification received from patient.  Patient voices that she always calls primary care office when she has symptoms that she thinks require immediate care if office is open. States she follows instructions of MD office and goes to emergency room if instructed. States her most recent ED visit involved severe pain to right & left temporal areas that had gotten worse since seeing primary care doctor. States she had future appointment scheduled with neurologist.  States she would go to emergency room if she had signs of possible stroke or heart attack.  Patient voices understanding of appropriate use of ED at this time.   Patient advises that she is seeing primary care doctor at regularly scheduled intervals. States most recent neurology appointment was March 7th and she has MRI scheduled for tomorrow 03/23. States may possibly need biopsy with current problem and sees general surgeon 4/5 for consult. States she has no problems with getting to MD appointments.   States she is getting medications without difficulty and is currently using local pharmacy and had signed up to start getting some of maintenance medications by mail. Voices that she manages own medications and is consistent with taking them as prescribed by her doctor.  Patient states COPD and HTN are under control. States she is currently under care for temporal pain and seeing specialist.   Patient voices that she is currently enrolled in [facility] program where she is getting calls from [place] every month.  Patient has declined [facility] care management services at this time.  Consents to receive [facility] literature with contact information.    Plan: Send [facility] contact information to patient. Send MD closure letter. Send to care management assistant request to close case.   Sherrin Daisy, RN BSN [place] Management Coordinator [facility] Care Management  [phone]

## 2015-09-01 ENCOUNTER — Ambulatory Visit
Admission: RE | Admit: 2015-09-01 | Discharge: 2015-09-01 | Disposition: A | Discharge status: Home / Self Care | Payer: Medicare PPO | Source: Ambulatory Visit | Attending: Neurology | Admitting: Neurology

## 2015-09-01 DIAGNOSIS — R51 Headache: Secondary | ICD-10-CM | POA: Insufficient documentation

## 2015-09-01 DIAGNOSIS — G4489 Other headache syndrome: Secondary | ICD-10-CM

## 2015-09-01 DIAGNOSIS — R42 Dizziness and giddiness: Secondary | ICD-10-CM | POA: Insufficient documentation

## 2015-09-07 ENCOUNTER — Telehealth: Payer: Self-pay | Admitting: Internal Medicine

## 2015-09-07 NOTE — Telephone Encounter (Signed)
Notified pt of test results pt requested to have a copy sent in the mail.

## 2015-09-07 NOTE — Telephone Encounter (Signed)
Pt called about Dr Brigitte Pulse sending her to another provider Dr Tamala Julian. Pt blood work showed inflammation of the blood vessel. Pt states they are going to have a biopsy of her temple and pt wants to know what her lab work showed from the last time? Call pt @ [phone]. Thank you!

## 2015-09-08 ENCOUNTER — Emergency Department
Admission: EM | Admit: 2015-09-08 | Discharge: 2015-09-08 | Disposition: A | Discharge status: Home / Self Care | Payer: Medicare PPO | Attending: Emergency Medicine | Admitting: Emergency Medicine

## 2015-09-08 DIAGNOSIS — Z7982 Long term (current) use of aspirin: Secondary | ICD-10-CM | POA: Diagnosis not present

## 2015-09-08 DIAGNOSIS — Z7951 Long term (current) use of inhaled steroids: Secondary | ICD-10-CM | POA: Insufficient documentation

## 2015-09-08 DIAGNOSIS — R531 Weakness: Secondary | ICD-10-CM | POA: Diagnosis present

## 2015-09-08 DIAGNOSIS — R251 Tremor, unspecified: Secondary | ICD-10-CM | POA: Insufficient documentation

## 2015-09-08 DIAGNOSIS — I129 Hypertensive chronic kidney disease with stage 1 through stage 4 chronic kidney disease, or unspecified chronic kidney disease: Secondary | ICD-10-CM | POA: Diagnosis not present

## 2015-09-08 DIAGNOSIS — N189 Chronic kidney disease, unspecified: Secondary | ICD-10-CM | POA: Diagnosis not present

## 2015-09-08 DIAGNOSIS — Z79899 Other long term (current) drug therapy: Secondary | ICD-10-CM | POA: Insufficient documentation

## 2015-09-08 LAB — BASIC METABOLIC PANEL
ANION GAP: 6 (ref 5–15)
BUN: 23 mg/dL — ABNORMAL HIGH (ref 6–20)
CHLORIDE: 108 mmol/L (ref 101–111)
CO2: 20 mmol/L — AB (ref 22–32)
Calcium: 9 mg/dL (ref 8.9–10.3)
Creatinine, Ser: 1.34 mg/dL — ABNORMAL HIGH (ref 0.44–1.00)
GFR calc Af Amer: 47 mL/min — ABNORMAL LOW (ref >60)
GFR, EST NON AFRICAN AMERICAN: 41 mL/min — AB (ref >60)
GLUCOSE: 93 mg/dL (ref 65–99)
POTASSIUM: 4.3 mmol/L (ref 3.5–5.1)
Sodium: 134 mmol/L — ABNORMAL LOW (ref 135–145)

## 2015-09-08 LAB — URINALYSIS COMPLETE WITH MICROSCOPIC (ARMC ONLY)
BACTERIA UA: NONE SEEN
Bilirubin Urine: NEGATIVE
Glucose, UA: NEGATIVE mg/dL
Ketones, ur: NEGATIVE mg/dL
Leukocytes, UA: NEGATIVE
NITRITE: NEGATIVE
PROTEIN: NEGATIVE mg/dL
RBC / HPF: NONE SEEN RBC/hpf (ref 0–5)
SPECIFIC GRAVITY, URINE: 1.011 (ref 1.005–1.030)
SQUAMOUS EPITHELIAL / LPF: NONE SEEN
pH: 5 (ref 5.0–8.0)

## 2015-09-08 LAB — CBC
HEMATOCRIT: 36.1 % (ref 35.0–47.0)
HEMOGLOBIN: 12.3 g/dL (ref 12.0–16.0)
MCH: 28.5 pg (ref 26.0–34.0)
MCHC: 34 g/dL (ref 32.0–36.0)
MCV: 83.7 fL (ref 80.0–100.0)
Platelets: 214 10*3/uL (ref 150–440)
RBC: 4.32 MIL/uL (ref 3.80–5.20)
RDW: 13.5 % (ref 11.5–14.5)
WBC: 8.5 10*3/uL (ref 3.6–11.0)

## 2015-09-08 LAB — GLUCOSE, CAPILLARY: GLUCOSE-CAPILLARY: 101 mg/dL — AB (ref 65–99)

## 2015-09-08 NOTE — ED Notes (Signed)
Pt c/o generalized weakness with HA for the past 2 months and has been seeing a specialist for the sx.. States today she just felt so "jittery and weak".Marland Kitchen

## 2015-09-08 NOTE — ED Provider Notes (Signed)
[facility] Emergency Department Provider Note  ____________________________________________    I have reviewed the triage vital signs and the nursing notes.   HISTORY  Chief Complaint Weakness and Headache    HPI Victoria Hanson is a 65 y.o. female who presents with numerous complaints, all of which have been occurring over the last 2-3 months. Primary among them is bitemporal discomfort. She has had a normal MRI for this and is scheduled for a temporal artery biopsy this week. She has no visual changes. No fevers or chills. No neck pain. No neuro deficits. She does complain of feeling off balance at times not currently. Today she felt jittery and weak in all of her arms and legs. But that has also resolved. She has been working with her neurologist regarding her symptoms     Past Medical History  Diagnosis Date  . Colon cancer ([place])     a. 1999: Pt had partial colectomy. Did develop lung metastasis. Had right upper lobectomy in 01/2007 then had associated chemotherapy.  . Hypercholesterolemia   . Hypertension   . Depression   . Nephrolithiasis   . Elevated transaminase level     a. ? NASH;  b. 06/2015 - nl LFTs.  . GERD (gastroesophageal reflux disease)   . Holter monitor, abnormal     a. 12/2009 Holter monitoring: NSR, rare PACs and PVCs.  . Echocardiogram abnormal     a. 02/2010 Echo: EF 55-60%, mild LAE, no regional wall motion abnormalities  . Lung cancer ([place])     a. S/P resection (lobectomy - right)  . COPD (chronic obstructive pulmonary disease) ([place])   . Anxiety   . DDD (degenerative disc disease), lumbosacral   . Hematuria   . Leaky heart valve     a. Per pt report - no evidence of valvular abnormalities on prior echoes.  . Atypical chest pain     a. 2003 Cath: reportedly nl per pt [name]);  b. 04/2015 [place] Admission, neg troponin.  . Morbid obesity ([place])   . Hyperlipidemia     Patient Active Problem List   Diagnosis Date Noted  .  Sinus pressure 08/03/2015  . Foot pain, bilateral 07/24/2015  . Hyperlipidemia 06/21/2015  . Atypical chest pain 06/21/2015  . Other fatigue 06/21/2015  . Morbid obesity due to excess calories ([place]) 06/21/2015  . Morbid obesity ([place])   . Abnormal mammogram 06/19/2015  . Urinary system disease 03/28/2015  . Hyperglycemia 03/20/2015  . Right lower quadrant abdominal pain 02/03/2015  . Swelling of lower extremity 11/20/2014  . H/O malignant neoplasm of colon 11/19/2014  . Dysuria 10/24/2014  . Flatulence, eructation and gas pain 10/24/2014  . Health care maintenance 07/17/2014  . Hypokalemia 04/05/2013  . TIA (transient ischemic attack) 02/09/2013  . Temporary cerebral vascular dysfunction 02/09/2013  . Chest pain at rest 05/07/2012  . Anxiety, mild 03/13/2012  . Depression, major, recurrent ([place]) 03/13/2012  . Lung cancer ([place]) 03/13/2012  . Colon cancer ([place]) 03/13/2012  . Hypertension 03/13/2012  . CKD (chronic kidney disease), stage III 03/13/2012  . GERD (gastroesophageal reflux disease) 03/13/2012  . Acid reflux 03/13/2012  . Essential (primary) hypertension 03/13/2012  . Cancer of lung ([place]) 03/13/2012  . Major depressive disorder with single episode ([place]) 03/13/2012  . Malignant neoplasm of colon ([place]) 03/13/2012  . Hypercholesterolemia 02/02/2010  . Pure hypercholesterolemia 02/02/2010    Past Surgical History  Procedure Laterality Date  . Colon surgery      Colon cancer, removed  part of large colon  . Lobectomy      Right lung, upper lobe right side removed  . Cholecystectomy  2000  . Tubal ligation  1980  . Total abdominal hysterectomy      abnormal bleeding  . Breast biopsy  10/07/2009    Right breast  . Colonoscopy N/A 04/25/2015    Procedure: COLONOSCOPY;  Surgeon: Manya Silvas, MD;  Location[facility] ENDOSCOPY;  Service: Endoscopy;  Laterality: N/A;  . Cardiac catheterization  2003    negative as per pt report    Current Outpatient Rx  Name  Route   Sig  Dispense  Refill  . albuterol (PROVENTIL HFA;VENTOLIN HFA) 108 (90 Base) MCG/ACT inhaler   Inhalation   Inhale 2 puffs into the lungs every 6 (six) hours as needed for wheezing.         Marland Kitchen ALPRAZolam (XANAX) 0.5 MG tablet   Oral   Take 1 tablet (0.5 mg total) by mouth daily as needed for anxiety.   60 tablet   0   . aspirin EC 325 MG tablet   Oral   Take 1 tablet (325 mg total) by mouth daily.   30 tablet   0   . colestipol (COLESTID) 1 g tablet   Oral   Take 1 g by mouth 4 (four) times daily.         Marland Kitchen gabapentin (NEURONTIN) 100 MG capsule   Oral   Take 1 capsule (100 mg total) by mouth 2 (two) times daily.   60 capsule   0   . mometasone (NASONEX) 50 MCG/ACT nasal spray   Nasal   Place 2 sprays into the nose daily.   17 g   6   . Multiple Vitamin (MULTIVITAMIN) tablet   Oral   Take 1 tablet by mouth daily.         Marland Kitchen omeprazole (PRILOSEC) 20 MG capsule   Oral   Take 20 mg by mouth daily.         . potassium chloride SA (K-DUR,KLOR-CON) 20 MEQ tablet   Oral   Take 40 mEq by mouth at bedtime.         . traZODone (DESYREL) 50 MG tablet   Oral   Take 125 mg by mouth at bedtime.         . triamterene-hydrochlorothiazide (MAXZIDE-25) 37.5-25 MG tablet   Oral   Take 1 tablet by mouth daily.           Allergies Macrobid; Antihistamines, diphenhydramine-type; Aspirin; Ceftin; Celecoxib; Cymbalta; Diphenhydramine; Escitalopram; Lexapro; Metronidazole; Zoloft; and Ciprofloxacin  Family History  Problem Relation Age of Onset  . Coronary artery disease Mother     died @ 76 of MI.  Marland Kitchen Heart attack Father 89    MI  . Cancer Father     lung - died in his mid-70's  . Hypertension Father   . Sudden death Brother   . Mental illness Mother   . Mental illness Brother   . Diabetes Mother   . Diabetes Father   . Heart attack Brother     MI @ 41, died @ age 70.    Social History Social History  Substance Use Topics  . Smoking status: Passive  Smoke Exposure - Never Smoker  . Smokeless tobacco: Never Used     Comment: husband and sons smoked in her home.  . Alcohol Use: 0.6 oz/week    0 Standard drinks or equivalent, 1 Glasses of wine per week  Comment: rare wine cooler.    Review of Systems  Constitutional: Positive for jitteriness Eyes: Negative for redness ENT: Negative for sore throat Cardiovascular: Negative for chest pain Respiratory: Negative for shortness of breath. No cough Gastrointestinal: Negative for abdominal pain Genitourinary: Negative for dysuria. Musculoskeletal: Negative for back pain. Skin: Negative for rash. Neurological: Negative for focal weakness. Psychiatric: no anxiety    ____________________________________________   PHYSICAL EXAM:  VITAL SIGNS: ED Triage Vitals  Enc Vitals Group     BP 09/08/15 1225 157/76 mmHg     Pulse Rate 09/08/15 1225 65     Resp 09/08/15 1225 16     Temp 09/08/15 1225 97.8 F (36.6 C)     Temp Source 09/08/15 1225 Oral     SpO2 09/08/15 1225 98 %     Weight 09/08/15 1225 220 lb (99.791 kg)     Height 09/08/15 1225 '5\' 4"'$  (1.626 m)     Head Cir --      Peak Flow --      Pain Score 09/08/15 1228 5     Pain Loc --      Pain Edu? --      Excl. in [place]? --      Constitutional: Alert and oriented. Well appearing and in no distress.  Eyes: Conjunctivae are normal. No erythema or injection, PERRLA, EOMI ENT   Head: Normocephalic and atraumatic.   Mouth/Throat: Mucous membranes are moist. Cardiovascular: Normal rate, regular rhythm. Normal and symmetric distal pulses are present in the upper extremities.  Respiratory: Normal respiratory effort without tachypnea nor retractions. Breath sounds are clear and equal bilaterally.  Gastrointestinal: Soft and non-tender in all quadrants. No distention. There is no CVA tenderness. Genitourinary: deferred Musculoskeletal: Nontender with normal range of motion in all extremities. No lower extremity tenderness  nor edema. Neurologic:  Normal speech and language. No gross focal neurologic deficits are appreciated. CN II through XII normal Skin:  Skin is warm, dry and intact. No rash noted. Psychiatric: Mood and affect are normal. Patient exhibits appropriate insight and judgment.  ____________________________________________    LABS (pertinent positives/negatives)  Labs Reviewed  BASIC METABOLIC PANEL - Abnormal; Notable for the following:    Sodium 134 (*)    CO2 20 (*)    BUN 23 (*)    Creatinine, Ser 1.34 (*)    GFR calc non Af Amer 41 (*)    GFR calc Af Amer 47 (*)    All other components within normal limits  URINALYSIS COMPLETEWITH MICROSCOPIC ([facility] ONLY) - Abnormal; Notable for the following:    Color, Urine STRAW (*)    APPearance CLEAR (*)    Hgb urine dipstick 1+ (*)    All other components within normal limits  GLUCOSE, CAPILLARY - Abnormal; Notable for the following:    Glucose-Capillary 101 (*)    All other components within normal limits  CBC  CBG MONITORING, ED    ____________________________________________   EKG  ED ECG REPORT I, Lavonia Drafts, the attending physician, personally viewed and interpreted this ECG.  Date: 09/08/2015 EKG Time: 12:31 PM Rate: 66 Rhythm: normal sinus rhythm QRS Axis: normal Intervals: normal ST/T Wave abnormalities: normal Conduction Disturbances: none Narrative Interpretation: Nonspecific changes   ____________________________________________    RADIOLOGY  None  ____________________________________________   PROCEDURES  Procedure(s) performed: none  Critical Care performed: none  ____________________________________________   INITIAL IMPRESSION / ASSESSMENT AND PLAN / ED COURSE  Pertinent labs & imaging results that were available during my care  of the patient were reviewed by me and considered in my medical decision making (see chart for details).  Patient well-appearing and in no distress. Her exam is  benign. Lab work is unremarkable besides a mild elevation in BUN and creatinine. I did encourage by mouth hydration. Patient has extensive workup in progress by specialists. No new symptoms today. No acute symptoms. Do not feel further testing would be beneficial at this time. I discussed this with the patient and she agrees, she is reassured by her lab work and will follow up with her neurologist.  ____________________________________________   FINAL CLINICAL IMPRESSION(S) / ED DIAGNOSES  Final diagnoses:  Generalized weakness          Lavonia Drafts, MD 09/08/15 1702

## 2015-09-08 NOTE — Discharge Instructions (Signed)

## 2015-09-21 ENCOUNTER — Other Ambulatory Visit: Payer: Self-pay | Admitting: Internal Medicine

## 2015-09-29 IMAGING — US US ABDOMEN COMPLETE
1 series · 13 of 25 positions shown · non-contrast
Comparison: Abdominal pelvic CT scan April 28, 2012

CLINICAL DATA: Periumbilical and right upper quadrant tenderness
with generalized abdominal discomfort for the past 3 weeks, history
of cholecystectomy.

EXAM:
ULTRASOUND ABDOMEN COMPLETE

[Series 1: us abdomen complete · 0.30mm/px · 13 of 87 slices shown]
[im 1/87]
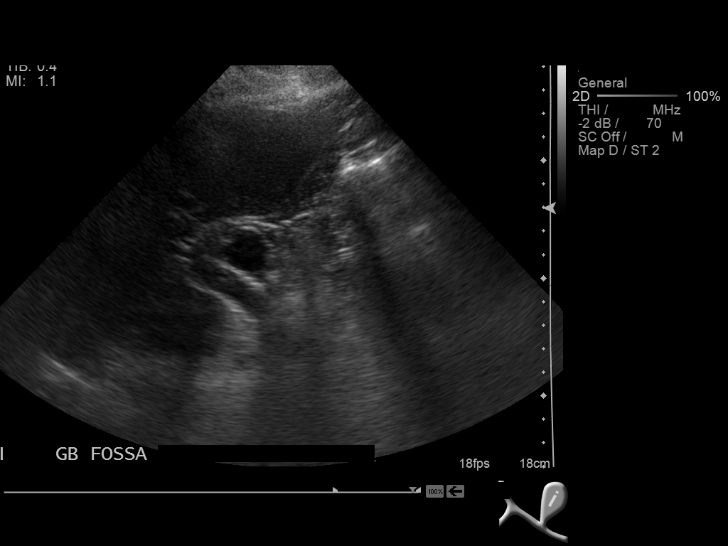
[im 8/87]
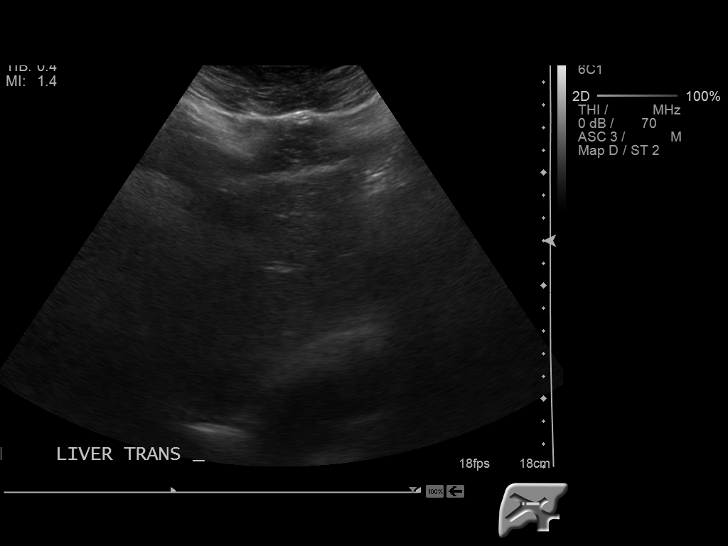
[im 15/87]
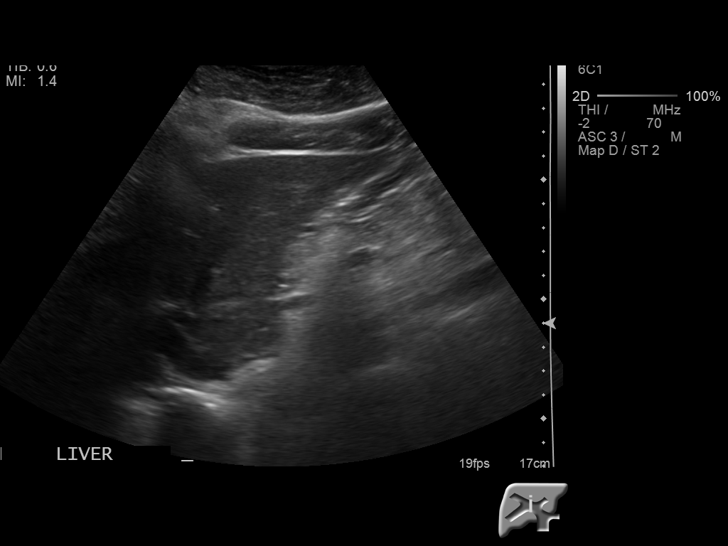
[im 22/87]
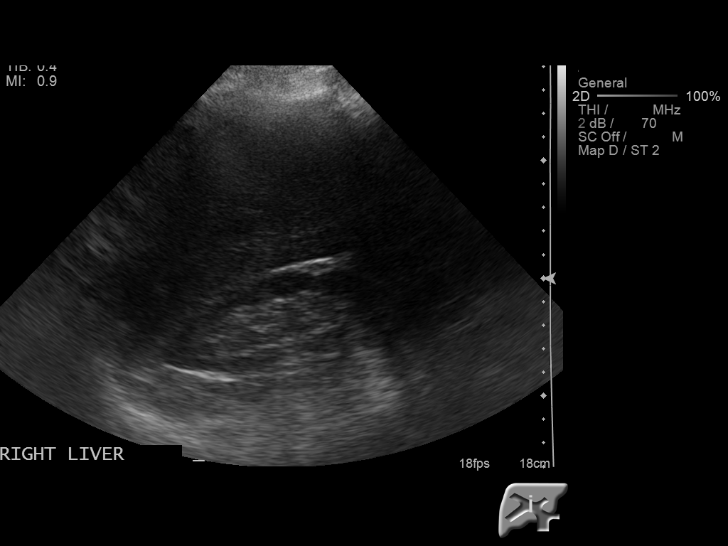
[im 29/87]
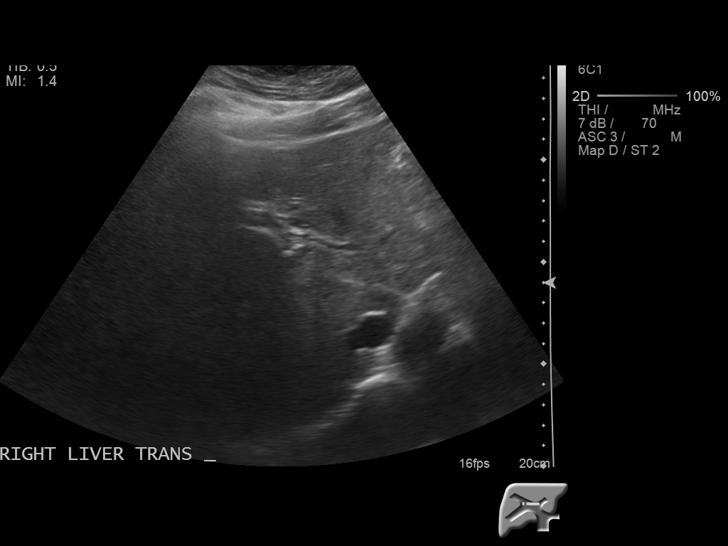
[im 36/87]
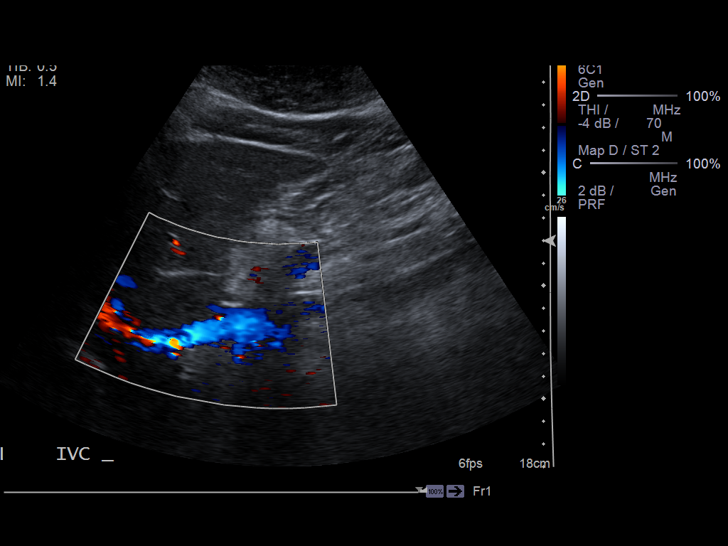
[im 44/87]
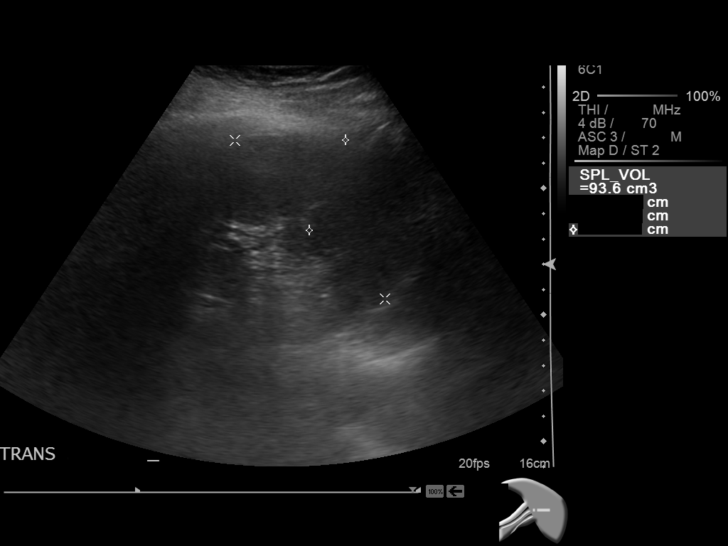
[im 51/87]
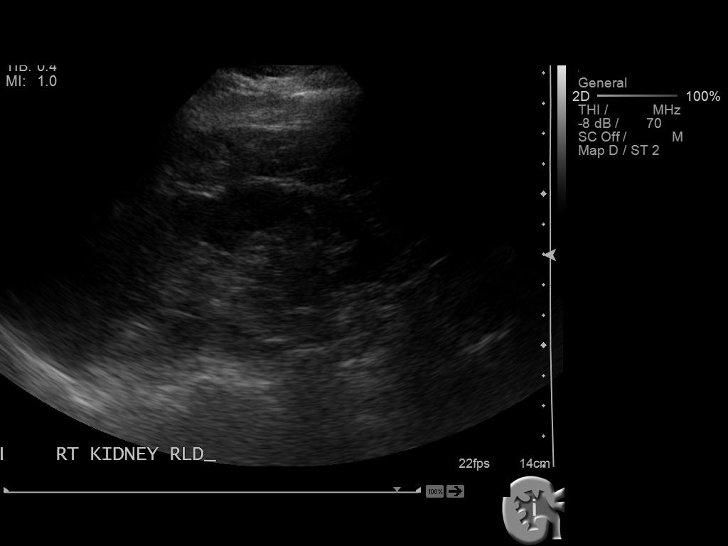
[im 58/87]
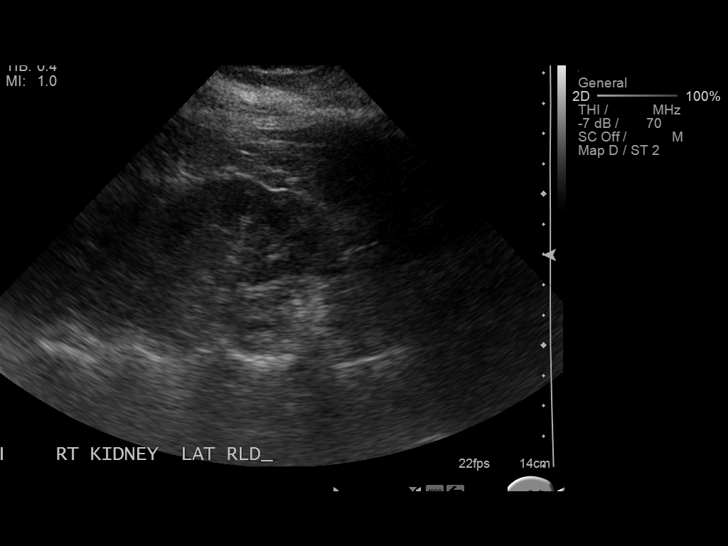
[im 65/87]
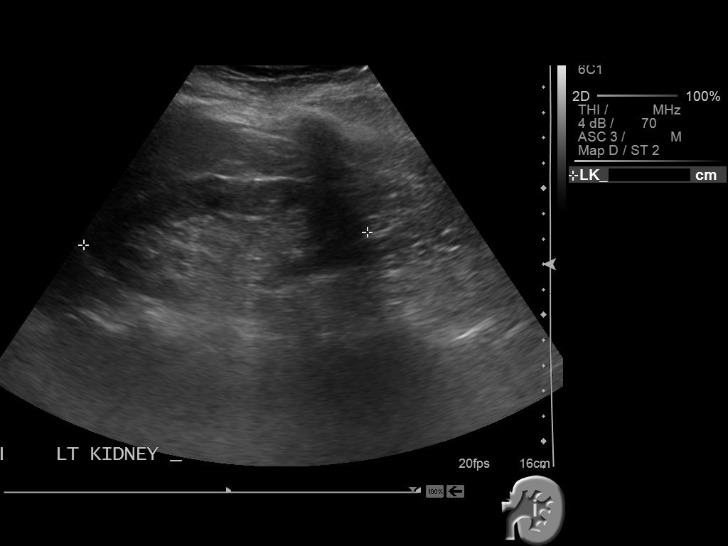
[im 72/87]
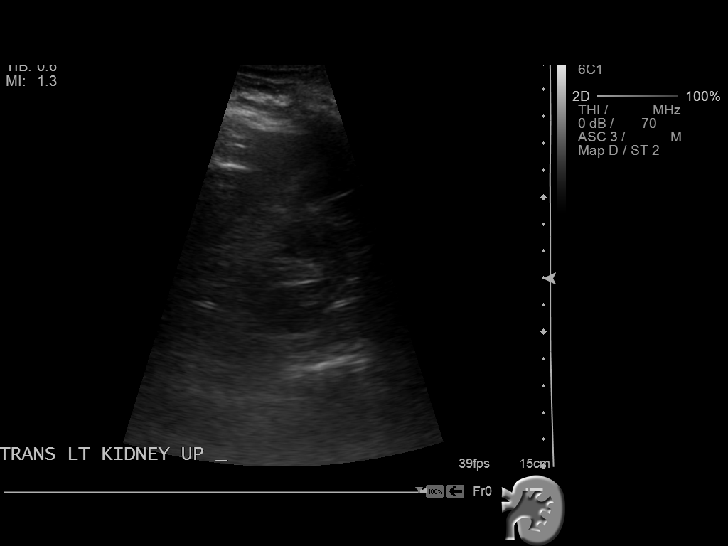
[im 79/87]
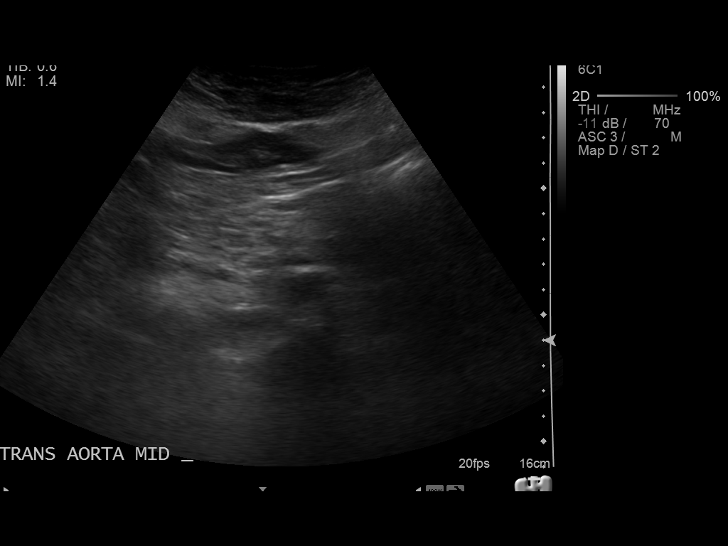
[im 87/87]
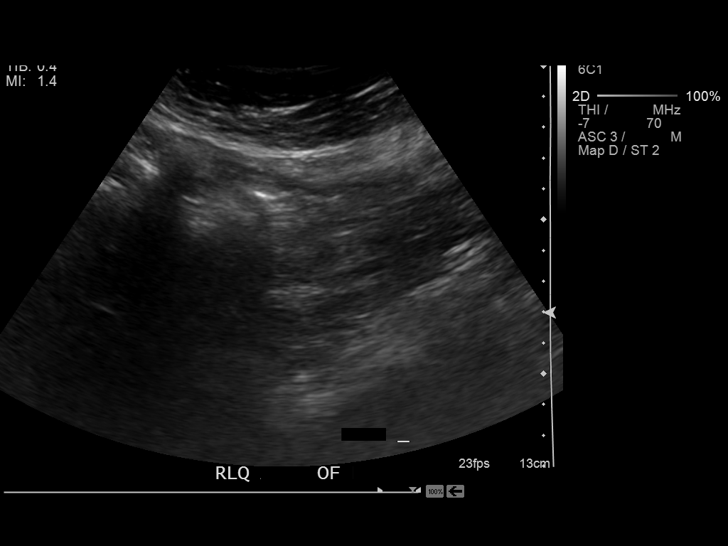

[13 of 25 positions shown; findings below may reference images not displayed]

FINDINGS: The study is limited due to the patient's body habitus and bowel
gas.

Gallbladder: No gallstones or wall thickening visualized. No
sonographic Murphy sign noted.

Common bile duct: Diameter: 16.7 cm. No abnormal intraluminal echoes
are demonstrated.

Liver: The liver exhibits normal echotexture where visualized. The
acoustical window is somewhat limited in terms are broad organ
coverage. No focal mass or intrahepatic ductal dilation is observed.

IVC: No abnormality visualized.

Pancreas: The pancreatic head and body are grossly normal. The
pancreatic tail is obscured by bowel gas.

Spleen: Size and appearance within normal limits.

Right Kidney: Length: 9.9 cm. The cortical echotexture is lower than
that of the adjacent liver. There is mild cortical thinning. There
is no hydronephrosis.

Left Kidney: Length: 11.2 cm. The cortical echotexture is normal.
There is no hydronephrosis.

Abdominal aorta: No aneurysm visualized.

Other findings: No ascites is observed.
IMPRESSION: 1. Dilation of the common bile duct to 16.7 cm. No common duct
stones are observed. There is no intrahepatic ductal dilation. The
visualized portions of the liver are unremarkable.
2. Limited visualization of the pancreatic tail. The remainder the
pancreas is unremarkable.
3. Mild atrophy of the right kidney. There is no significant
parenchymal echotexture change. There is no evidence of obstruction.
4. Given the prominent common bile ductal dilation, further
evaluation of the abdomen with MRI or CT scanning is recommended.

## 2015-10-11 ENCOUNTER — Telehealth: Payer: Self-pay | Admitting: *Deleted

## 2015-10-11 ENCOUNTER — Ambulatory Visit (INDEPENDENT_AMBULATORY_CARE_PROVIDER_SITE_OTHER): Payer: Medicare PPO | Admitting: Internal Medicine

## 2015-10-11 ENCOUNTER — Encounter: Payer: Self-pay | Admitting: Internal Medicine

## 2015-10-11 VITALS — BP 120/70 | HR 77 | Temp 97.7°F | Resp 18 | Ht 62.75 in | Wt 191.2 lb

## 2015-10-11 DIAGNOSIS — Z0000 Encounter for general adult medical examination without abnormal findings: Secondary | ICD-10-CM | POA: Diagnosis not present

## 2015-10-11 DIAGNOSIS — Z1239 Encounter for other screening for malignant neoplasm of breast: Secondary | ICD-10-CM

## 2015-10-11 DIAGNOSIS — I10 Essential (primary) hypertension: Secondary | ICD-10-CM | POA: Diagnosis not present

## 2015-10-11 DIAGNOSIS — R700 Elevated erythrocyte sedimentation rate: Secondary | ICD-10-CM | POA: Diagnosis not present

## 2015-10-11 LAB — BASIC METABOLIC PANEL
BUN: 19 mg/dL (ref 6–23)
CALCIUM: 9.6 mg/dL (ref 8.4–10.5)
CO2: 25 mEq/L (ref 19–32)
CREATININE: 1.15 mg/dL (ref 0.40–1.20)
Chloride: 103 mEq/L (ref 96–112)
GFR: 50.19 mL/min — AB (ref >60.00)
Glucose, Bld: 94 mg/dL (ref 70–99)
POTASSIUM: 4.3 meq/L (ref 3.5–5.1)
Sodium: 138 mEq/L (ref 135–145)

## 2015-10-11 LAB — SEDIMENTATION RATE: SED RATE: 29 mm/h — AB (ref 0–22)

## 2015-10-11 NOTE — Progress Notes (Signed)
Pre-visit discussion using our clinic review tool. No additional management support is needed unless otherwise documented below in the visit note.  

## 2015-10-12 ENCOUNTER — Encounter: Payer: Self-pay | Admitting: *Deleted

## 2015-10-18 ENCOUNTER — Encounter: Payer: Self-pay | Admitting: Internal Medicine

## 2015-10-18 ENCOUNTER — Telehealth: Payer: Self-pay | Admitting: Cardiovascular Disease

## 2015-10-18 NOTE — Telephone Encounter (Signed)
S/w pt who reports last evening she had tightening on left side of neck. "Feels like it blowed up then went back down and my breathing got really, really bad and my head felt real funny" She checked her BP and reports it was "fine". She took two baby aspirin and maxzide, proceeded to ER via her daughter. On the way to the hospital, symptoms resolved and she came back home and went to bed. Denies any other symptoms.  She reports "not feeling exactly good today" but no specific symptoms. She has not taken BP today. She was last seen Jan 2017. Ignacia Bayley, NP, ordered echo, myoview and f/u one month. Feb 14 f/u visit was cancelled. She would like to be seen by Dr. Fletcher Anon. Reviewed s/s that would need immediate attention in an ER setting. Pt verbalized understanding.  Message to scheduling for OV w/[place].

## 2015-10-18 NOTE — Progress Notes (Signed)
Patient ID: LASHALA LASER, female   DOB: 11/30/1950, 65 y.o.   MRN: [id number] The patient is here for welcome to  Medicare wellness examination.     The risk factors are reflected in the social history.  The roster of all physicians providing medical care to patient - Dr Tamala Julian - surgery, Dr Manuella Ghazi - neurology, Dr Tiffany Kocher - gastroenterology and Dr Oliva Bustard - oncology.   Activities of daily living:  The patient is 100% independent in all ADLs: dressing, toileting, feeding as well as independent mobility  Home safety : The patient has smoke detectors in the home. She wears seatbelts.  There are no firearms at home. There is no violence in the home.   There is no risks for hepatitis, STDs or HIV. There is no   history of blood transfusion. She has no travel history to infectious disease endemic areas of the world.  The patient has seen her dentist in the last six month.  She had teet extracted.  She has seen her eye doctor in the last year. She denies any significant hearing issues.  She does not  have excessive sun exposure. Discussed the need for sun protection: hats, long sleeves and use of sunscreen if there is significant sun exposure.   Diet: the importance of a healthy diet is discussed.  She is trying to watch her diet.   The benefits of regular aerobic exercise were discussed. She does not do formal exercise.  Does try to stay physically active.     Depression screen: there are no signs or vegative symptoms of depression- irritability, change in appetite, anhedonia, sadness/tearfullness.  She did report some issues previously with feeling down, but states now that her head is doing better she feels better.  No hopelessness.    Cognitive assessment: the patient manages all her financial and personal affairs and is actively engaged. She could relate day,date,year and events; recalled 2/3 objects at 3 minutes.    The following portions of the patient's history were reviewed and updated as  appropriate: allergies, current medications, past family history, past medical history,  past surgical history, past social history  and problem list.  Visual acuity was not assessed per patient preference since she has regular follow up with her ophthalmologist. Body mass index - assessed and reviewed.   During the course of the visit the patient was educated and counseled about appropriate screening and preventive services including : fall prevention , diabetes screening, nutrition counseling, colorectal cancer screening, and recommended immunizations.  States had tetanus booster 07/2015.  Discussed bone density and she is agreeable to be scheduled.    Discussed living will and end of life expectations.  States she has talked with her family about her wishes.     HEENT:  NARES:  Clear.   OP:  Without lesions or eyrhema.   NECK:  Supple, nontender.   No palpable lymphadenopathy No audible carotid bruit. HEART:  Appears to be regular.  LUNGS:  Without crackles or wheezing audible.  Respirations even unlabored. RADIAL PULSE:  Equal bilaterally. ABDOMEN:   Soft, nontender.  No audible abdominal bruit. EXTREMITIES:  No edema present.     Welcome to medicare visit today as outlined above.  feeling better.  Headache better.  Labs as outlined.

## 2015-10-20 ENCOUNTER — Emergency Department: Payer: Medicare PPO

## 2015-10-20 ENCOUNTER — Emergency Department
Admission: EM | Admit: 2015-10-20 | Discharge: 2015-10-20 | Disposition: A | Discharge status: Home / Self Care | Payer: Medicare PPO | Attending: Student | Admitting: Student

## 2015-10-20 ENCOUNTER — Encounter: Payer: Self-pay | Admitting: Emergency Medicine

## 2015-10-20 ENCOUNTER — Telehealth: Payer: Self-pay | Admitting: *Deleted

## 2015-10-20 DIAGNOSIS — Z85118 Personal history of other malignant neoplasm of bronchus and lung: Secondary | ICD-10-CM | POA: Insufficient documentation

## 2015-10-20 DIAGNOSIS — E785 Hyperlipidemia, unspecified: Secondary | ICD-10-CM | POA: Insufficient documentation

## 2015-10-20 DIAGNOSIS — J449 Chronic obstructive pulmonary disease, unspecified: Secondary | ICD-10-CM | POA: Insufficient documentation

## 2015-10-20 DIAGNOSIS — F339 Major depressive disorder, recurrent, unspecified: Secondary | ICD-10-CM | POA: Diagnosis not present

## 2015-10-20 DIAGNOSIS — R110 Nausea: Secondary | ICD-10-CM | POA: Diagnosis present

## 2015-10-20 DIAGNOSIS — Z79899 Other long term (current) drug therapy: Secondary | ICD-10-CM | POA: Insufficient documentation

## 2015-10-20 DIAGNOSIS — N183 Chronic kidney disease, stage 3 (moderate): Secondary | ICD-10-CM | POA: Insufficient documentation

## 2015-10-20 DIAGNOSIS — Z7982 Long term (current) use of aspirin: Secondary | ICD-10-CM | POA: Insufficient documentation

## 2015-10-20 DIAGNOSIS — M542 Cervicalgia: Secondary | ICD-10-CM | POA: Diagnosis not present

## 2015-10-20 DIAGNOSIS — Z85038 Personal history of other malignant neoplasm of large intestine: Secondary | ICD-10-CM | POA: Insufficient documentation

## 2015-10-20 DIAGNOSIS — M5137 Other intervertebral disc degeneration, lumbosacral region: Secondary | ICD-10-CM | POA: Insufficient documentation

## 2015-10-20 DIAGNOSIS — Z8673 Personal history of transient ischemic attack (TIA), and cerebral infarction without residual deficits: Secondary | ICD-10-CM | POA: Insufficient documentation

## 2015-10-20 DIAGNOSIS — I129 Hypertensive chronic kidney disease with stage 1 through stage 4 chronic kidney disease, or unspecified chronic kidney disease: Secondary | ICD-10-CM | POA: Diagnosis not present

## 2015-10-20 DIAGNOSIS — Z7722 Contact with and (suspected) exposure to environmental tobacco smoke (acute) (chronic): Secondary | ICD-10-CM | POA: Diagnosis not present

## 2015-10-20 LAB — CBC WITH DIFFERENTIAL/PLATELET
BASOS ABS: 0.2 10*3/uL — AB (ref 0–0.1)
Eosinophils Absolute: 0.2 10*3/uL (ref 0–0.7)
HCT: 36 % (ref 35.0–47.0)
Hemoglobin: 12 g/dL (ref 12.0–16.0)
Lymphocytes Relative: 26 %
Lymphs Abs: 2.6 10*3/uL (ref 1.0–3.6)
MCH: 28.5 pg (ref 26.0–34.0)
MCHC: 33.4 g/dL (ref 32.0–36.0)
MCV: 85.3 fL (ref 80.0–100.0)
MONO ABS: 0.6 10*3/uL (ref 0.2–0.9)
Monocytes Relative: 6 %
Neutro Abs: 6.3 10*3/uL (ref 1.4–6.5)
Neutrophils Relative %: 64 %
PLATELETS: 229 10*3/uL (ref 150–440)
RBC: 4.22 MIL/uL (ref 3.80–5.20)
RDW: 13.9 % (ref 11.5–14.5)
WBC: 9.8 10*3/uL (ref 3.6–11.0)

## 2015-10-20 LAB — BASIC METABOLIC PANEL
ANION GAP: 9 (ref 5–15)
BUN: 17 mg/dL (ref 6–20)
CALCIUM: 9.2 mg/dL (ref 8.9–10.3)
CO2: 25 mmol/L (ref 22–32)
Chloride: 104 mmol/L (ref 101–111)
Creatinine, Ser: 1.16 mg/dL — ABNORMAL HIGH (ref 0.44–1.00)
GFR, EST AFRICAN AMERICAN: 56 mL/min — AB (ref >60)
GFR, EST NON AFRICAN AMERICAN: 48 mL/min — AB (ref >60)
GLUCOSE: 95 mg/dL (ref 65–99)
Potassium: 3.5 mmol/L (ref 3.5–5.1)
Sodium: 138 mmol/L (ref 135–145)

## 2015-10-20 MED ORDER — SODIUM CHLORIDE 0.9 % IV BOLUS (SEPSIS)
500.0000 mL | Freq: Once | INTRAVENOUS | Status: AC
  Administered 2015-10-20: 500 mL via INTRAVENOUS

## 2015-10-20 MED ORDER — IOPAMIDOL (ISOVUE-300) INJECTION 61%
75.0000 mL | Freq: Once | INTRAVENOUS | Status: AC | PRN
  Administered 2015-10-20: 75 mL via INTRAVENOUS

## 2015-10-20 NOTE — Discharge Instructions (Signed)

## 2015-10-20 NOTE — Telephone Encounter (Signed)
Spoke with the patient, she was out of breath on the phone, per her she was sweeping the floor.  She repeated the same story from below, felt like her vein was swelling, took two baby aspirin, .5 of Xanax, and drank water and it calmed her down.  SOB was bad, She was concerned with what doctor she should follow up with.  Made appts with both:   Made an appt Dr. Fletcher Anon on the 16th at 2pm.   Neurologist appt Dr. Brigitte Pulse on the 17th, at [facility] back to taking the aspirin daily, 1/2 Xanax with the panic attacks as needed.   Please advise?

## 2015-10-20 NOTE — Telephone Encounter (Signed)
Please advise 

## 2015-10-20 NOTE — Telephone Encounter (Signed)
Patient stated that she felt like her artery on the left side of her neck harden and made her feel anxiety which caused SOB. She wanted to speak with Dr. Nicki Reaper about the situation. She had no pain  at the time. This happed several days ago. Patient stated that she has been experiencing panic attack often. No other artery episode since the first one.  Please advise  [phone]

## 2015-10-20 NOTE — Telephone Encounter (Signed)
Advised patient to be seen due to her SOB and she stated she would go to the ER at [facility]. Thanks

## 2015-10-20 NOTE — ED Notes (Signed)
Patient transported to CT 

## 2015-10-20 NOTE — ED Provider Notes (Signed)
Followed up on studies as asked by Dr. Edd Fabian. CT scan unremarkable, labs unremarkable. Patient feels well, feel she is appropriate for discharge at this time.  Lavonia Drafts, MD 10/20/15 [id number]

## 2015-10-20 NOTE — Telephone Encounter (Signed)
Please call pt and let her know that I am seeing pts.  See overall how she is doing now.  Make sure nothing else acute going on now.  Let me know if feel needs to be evaluated.

## 2015-10-20 NOTE — ED Notes (Deleted)
Says seen in office for lung problems about 2 weeks ago and placed on prednisone.  Says continues to cough --productively and pain on right side--with cough.

## 2015-10-20 NOTE — ED Notes (Signed)
Pt reports the L side of her neck is swollen and hard, reports some SOB with this and nausea. Denies any other symptoms. States it became very hard to breathe due to " bubble in her artery on my neck" reports she also had a panic attack from this yesterday

## 2015-10-20 NOTE — Telephone Encounter (Signed)
If acutely sob with previous symptoms, please have her go to acute care now to confirm nothing acute going on.  Then can f/u after.  Thanks

## 2015-10-20 NOTE — ED Provider Notes (Signed)
[facility] Emergency Department Provider Note   ____________________________________________  Time seen: Approximately 6:42 PM  I have reviewed the triage vital signs and the nursing notes.   HISTORY  Chief Complaint Cough    HPI Victoria Hanson is a 65 y.o. female with remote history of colon cancer and lung cancer, hypertension, hyperlipidemia, COPD, GERD who presents for evaluation of left neck pain and swelling ongoing for the past 2 days, gradual onset, now improved, no modifying factors. Patient reports that 2 nights ago "something bubbled up over my artery in my left neck and then the spot got really hard and I got short of breath". She says that she thinks she might have had a panic attack after she noted the neck swelling. She has had mild nausea but no vomiting or diarrhea. She denies any current shortness of breath, no chest pain, no fevers.   Past Medical History  Diagnosis Date  . Colon cancer ([place])     a. 1999: Pt had partial colectomy. Did develop lung metastasis. Had right upper lobectomy in 01/2007 then had associated chemotherapy.  . Hypercholesterolemia   . Hypertension   . Depression   . Nephrolithiasis   . Elevated transaminase level     a. ? NASH;  b. 06/2015 - nl LFTs.  . GERD (gastroesophageal reflux disease)   . Holter monitor, abnormal     a. 12/2009 Holter monitoring: NSR, rare PACs and PVCs.  . Echocardiogram abnormal     a. 02/2010 Echo: EF 55-60%, mild LAE, no regional wall motion abnormalities  . Lung cancer ([place])     a. S/P resection (lobectomy - right)  . COPD (chronic obstructive pulmonary disease) ([place])   . Anxiety   . DDD (degenerative disc disease), lumbosacral   . Hematuria   . Leaky heart valve     a. Per pt report - no evidence of valvular abnormalities on prior echoes.  . Atypical chest pain     a. 2003 Cath: reportedly nl per pt Victoria Hanson);  b. 04/2015 [place] Admission, neg troponin.  . Morbid obesity ([place])    . Hyperlipidemia     Patient Active Problem List   Diagnosis Date Noted  . Sinus pressure 08/03/2015  . Foot pain, bilateral 07/24/2015  . Hyperlipidemia 06/21/2015  . Atypical chest pain 06/21/2015  . Other fatigue 06/21/2015  . Morbid obesity due to excess calories ([place]) 06/21/2015  . Morbid obesity ([place])   . Abnormal mammogram 06/19/2015  . Urinary system disease 03/28/2015  . Hyperglycemia 03/20/2015  . Right lower quadrant abdominal pain 02/03/2015  . Swelling of lower extremity 11/20/2014  . H/O malignant neoplasm of colon 11/19/2014  . Dysuria 10/24/2014  . Flatulence, eructation and gas pain 10/24/2014  . Health care maintenance 07/17/2014  . Hypokalemia 04/05/2013  . TIA (transient ischemic attack) 02/09/2013  . Temporary cerebral vascular dysfunction 02/09/2013  . Chest pain at rest 05/07/2012  . Anxiety, mild 03/13/2012  . Depression, major, recurrent ([place]) 03/13/2012  . Lung cancer ([place]) 03/13/2012  . Colon cancer ([place]) 03/13/2012  . Hypertension 03/13/2012  . CKD (chronic kidney disease), stage III 03/13/2012  . GERD (gastroesophageal reflux disease) 03/13/2012  . Acid reflux 03/13/2012  . Essential (primary) hypertension 03/13/2012  . Cancer of lung ([place]) 03/13/2012  . Major depressive disorder with single episode ([place]) 03/13/2012  . Malignant neoplasm of colon ([place]) 03/13/2012  . Hypercholesterolemia 02/02/2010  . Pure hypercholesterolemia 02/02/2010    Past Surgical History  Procedure Laterality  Date  . Colon surgery      Colon cancer, removed part of large colon  . Lobectomy      Right lung, upper lobe right side removed  . Cholecystectomy  2000  . Tubal ligation  1980  . Total abdominal hysterectomy      abnormal bleeding  . Breast biopsy  10/07/2009    Right breast  . Colonoscopy N/A 04/25/2015    Procedure: COLONOSCOPY;  Surgeon: Manya Silvas, MD;  Location: [facility] ENDOSCOPY;  Service: Endoscopy;  Laterality: N/A;  . Cardiac  catheterization  2003    negative as per pt report    Current Outpatient Rx  Name  Route  Sig  Dispense  Refill  . albuterol (PROVENTIL HFA;VENTOLIN HFA) 108 (90 Base) MCG/ACT inhaler   Inhalation   Inhale 2 puffs into the lungs every 6 (six) hours as needed for wheezing.         Marland Kitchen ALPRAZolam (XANAX) 0.5 MG tablet   Oral   Take 1 tablet (0.5 mg total) by mouth daily as needed for anxiety. Patient taking differently: Take 0.5-1 mg by mouth 2 (two) times daily as needed for anxiety.    60 tablet   0   . aspirin EC 81 MG tablet   Oral   Take 81 mg by mouth daily.         . baclofen (LIORESAL) 10 MG tablet               . colestipol (COLESTID) 1 g tablet   Oral   Take 1 g by mouth 4 (four) times daily.         . Multiple Vitamin (MULTIVITAMIN) tablet   Oral   Take 1 tablet by mouth daily.         Marland Kitchen omeprazole (PRILOSEC) 20 MG capsule   Oral   Take 20 mg by mouth daily.         . potassium chloride SA (K-DUR,KLOR-CON) 20 MEQ tablet      TAKE 1 TABLET BY MOUTH TWICE A DAY.   60 tablet   3   . traZODone (DESYREL) 50 MG tablet   Oral   Take 75 mg by mouth at bedtime.          . triamterene-hydrochlorothiazide (MAXZIDE-25) 37.5-25 MG tablet   Oral   Take 1 tablet by mouth daily.           Allergies Macrobid; Antihistamines, diphenhydramine-type; Aspirin; Ceftin; Celecoxib; Cymbalta; Diphenhydramine; Escitalopram; Lexapro; Metronidazole; Zoloft; and Ciprofloxacin  Family History  Problem Relation Age of Onset  . Coronary artery disease Mother     died @ 43 of MI.  Marland Kitchen Heart attack Father 77    MI  . Cancer Father     lung - died in his mid-70's  . Hypertension Father   . Sudden death Brother   . Mental illness Mother   . Mental illness Brother   . Diabetes Mother   . Diabetes Father   . Heart attack Brother     MI @ 51, died @ age 75.    Social History Social History  Substance Use Topics  . Smoking status: Passive Smoke Exposure -  Never Smoker  . Smokeless tobacco: Never Used     Comment: husband and sons smoked in her home.  . Alcohol Use: 0.6 oz/week    0 Standard drinks or equivalent, 1 Glasses of wine per week     Comment: rare wine cooler.  Review of Systems Constitutional: No fever/chills Eyes: No visual changes. ENT: No sore throat. Cardiovascular: Denies chest pain. Respiratory: Denies shortness of breath. Gastrointestinal: No abdominal pain.  + nausea, no vomiting.  No diarrhea.  No constipation. Genitourinary: Negative for dysuria. Musculoskeletal: Negative for back pain. Skin: Negative for rash. Neurological: Negative for headaches, focal weakness or numbness.  10-point ROS otherwise negative.  ____________________________________________   PHYSICAL EXAM:  VITAL SIGNS: ED Triage Vitals  Enc Vitals Group     BP 10/20/15 1828 149/78 mmHg     Pulse Rate 10/20/15 1828 65     Resp 10/20/15 1828 16     Temp 10/20/15 1828 98.1 F (36.7 C)     Temp src --      SpO2 10/20/15 1828 99 %     Weight --      Height --      Head Cir --      Peak Flow --      Pain Score --      Pain Loc --      Pain Edu? --      Excl. in [place]? --     Constitutional: Alert and oriented. Well appearing and in no acute distress. Eyes: Conjunctivae are normal. PERRL. EOMI. Head: Atraumatic. Nose: No congestion/rhinnorhea. Mouth/Throat: Mucous membranes are moist.  Oropharynx non-erythematous. Neck: No stridor.  Supple without meningismus. Mild tenderness in the left submandibular space without obvious mass, swelling, abnormality, no erythema, no fluctuance. Cardiovascular: Normal rate, regular rhythm. Grossly normal heart sounds.  Good peripheral circulation. Respiratory: Normal respiratory effort.  No retractions. Lungs CTAB. Gastrointestinal: Soft and nontender. No distention. No CVA tenderness. Genitourinary: deferred Musculoskeletal: No lower extremity tenderness nor edema.  No joint effusions. Neurologic:   Normal speech and language. No gross focal neurologic deficits are appreciated. No gait instability. Skin:  Skin is warm, dry and intact. No rash noted. Psychiatric: Mood and affect are normal. Speech and behavior are normal.  ____________________________________________   LABS (all labs ordered are listed, but only abnormal results are displayed)  Labs Reviewed  CBC WITH DIFFERENTIAL/PLATELET - Abnormal; Notable for the following:    Basophils Absolute 0.2 (*)    All other components within normal limits  BASIC METABOLIC PANEL - Abnormal; Notable for the following:    Creatinine, Ser 1.16 (*)    GFR calc non Af Amer 48 (*)    GFR calc Af Amer 56 (*)    All other components within normal limits   ____________________________________________  EKG  pending  ____________________________________________  RADIOLOGY  CXR - pending  CT neck - pending ____________________________________________   PROCEDURES  Procedure(s) performed: None  Critical Care performed: No  ____________________________________________   INITIAL IMPRESSION / ASSESSMENT AND PLAN / ED COURSE  Pertinent labs & imaging results that were available during my care of the patient were reviewed by me and considered in my medical decision making (see chart for details).  Victoria Hanson is a 65 y.o. female with remote history of colon cancer and lung cancer, hypertension, hyperlipidemia, COPD, GERD who presents for evaluation of left neck pain and swelling ongoing for the past 2 days. On exam, she is very well-appearing and in no acute distress. Her vital signs are stable, she is afebrile. His faint tenderness in the left submandibular space however there is no palpable abnormality. That being said, given her complaint that this 2 days ago was rapidly expanding and causing her shortness of breath, we'll obtain CT soft tissue neck with  contrast to rule out abscess, make sure there is no atraumatic expanding  hematoma which I think would be unlikely. We'll check her labs, obtain a screening chest x-ray. Care transferred to Dr. Corky Downs at 8:15 PM pending labs and imaging,  Screening EKG also pending. ____________________________________________   FINAL CLINICAL IMPRESSION(S) / ED DIAGNOSES  Final diagnoses:  Neck pain on left side      NEW MEDICATIONS STARTED DURING THIS VISIT:  Discharge Medication List as of 10/20/2015 10:34 PM       Note:  This document was prepared using Dragon voice recognition software and may include unintentional dictation errors.    Joanne Gavel, MD 10/25/15 [phone]

## 2015-10-25 ENCOUNTER — Ambulatory Visit: Payer: Medicare PPO | Admitting: Cardiovascular Disease

## 2015-11-04 ENCOUNTER — Telehealth: Payer: Self-pay | Admitting: Internal Medicine

## 2015-11-04 DIAGNOSIS — E2839 Other primary ovarian failure: Secondary | ICD-10-CM

## 2015-11-04 NOTE — Telephone Encounter (Signed)
Please order bone density

## 2015-11-05 NOTE — Telephone Encounter (Signed)
Order placed for bone density.  Please schedule.

## 2015-11-06 ENCOUNTER — Encounter: Payer: Self-pay | Admitting: Emergency Medicine

## 2015-11-06 DIAGNOSIS — N183 Chronic kidney disease, stage 3 (moderate): Secondary | ICD-10-CM | POA: Insufficient documentation

## 2015-11-06 DIAGNOSIS — F329 Major depressive disorder, single episode, unspecified: Secondary | ICD-10-CM | POA: Insufficient documentation

## 2015-11-06 DIAGNOSIS — Z7951 Long term (current) use of inhaled steroids: Secondary | ICD-10-CM | POA: Diagnosis not present

## 2015-11-06 DIAGNOSIS — E785 Hyperlipidemia, unspecified: Secondary | ICD-10-CM | POA: Insufficient documentation

## 2015-11-06 DIAGNOSIS — Z85118 Personal history of other malignant neoplasm of bronchus and lung: Secondary | ICD-10-CM | POA: Diagnosis not present

## 2015-11-06 DIAGNOSIS — Z85038 Personal history of other malignant neoplasm of large intestine: Secondary | ICD-10-CM | POA: Diagnosis not present

## 2015-11-06 DIAGNOSIS — Z7722 Contact with and (suspected) exposure to environmental tobacco smoke (acute) (chronic): Secondary | ICD-10-CM | POA: Diagnosis not present

## 2015-11-06 DIAGNOSIS — I129 Hypertensive chronic kidney disease with stage 1 through stage 4 chronic kidney disease, or unspecified chronic kidney disease: Secondary | ICD-10-CM | POA: Insufficient documentation

## 2015-11-06 DIAGNOSIS — Z7982 Long term (current) use of aspirin: Secondary | ICD-10-CM | POA: Insufficient documentation

## 2015-11-06 DIAGNOSIS — J449 Chronic obstructive pulmonary disease, unspecified: Secondary | ICD-10-CM | POA: Insufficient documentation

## 2015-11-06 DIAGNOSIS — Z79899 Other long term (current) drug therapy: Secondary | ICD-10-CM | POA: Insufficient documentation

## 2015-11-06 DIAGNOSIS — R002 Palpitations: Secondary | ICD-10-CM | POA: Insufficient documentation

## 2015-11-06 NOTE — ED Notes (Signed)
Pt says around 9pm she felt like she was having palpitations, her heart was "flip flopping"; took '325mg'$  aspirin and sensation stopped; pt says just pta she had another episode that has since resolved; pt denies chest pain; denies shortness of breath; denies N/V; denies diaphoresis; pt reports history of similar episodes but "not like this"; pt awake, alert and oriented x 3; talking in complete coherent sentences

## 2015-11-07 ENCOUNTER — Emergency Department
Admission: EM | Admit: 2015-11-07 | Discharge: 2015-11-07 | Disposition: A | Discharge status: Home / Self Care | Payer: Medicare PPO | Attending: Emergency Medicine | Admitting: Emergency Medicine

## 2015-11-07 ENCOUNTER — Emergency Department: Payer: Medicare PPO

## 2015-11-07 DIAGNOSIS — R002 Palpitations: Secondary | ICD-10-CM

## 2015-11-07 LAB — TROPONIN I: Troponin I: <0.03 ng/mL (ref <0.031)

## 2015-11-07 LAB — CBC
HCT: 33.7 % — ABNORMAL LOW (ref 35.0–47.0)
Hemoglobin: 11.4 g/dL — ABNORMAL LOW (ref 12.0–16.0)
MCH: 29.1 pg (ref 26.0–34.0)
MCHC: 33.9 g/dL (ref 32.0–36.0)
MCV: 85.8 fL (ref 80.0–100.0)
Platelets: 210 10*3/uL (ref 150–440)
RBC: 3.92 MIL/uL (ref 3.80–5.20)
RDW: 13.5 % (ref 11.5–14.5)
WBC: 9.4 10*3/uL (ref 3.6–11.0)

## 2015-11-07 LAB — MAGNESIUM: MAGNESIUM: 2 mg/dL (ref 1.7–2.4)

## 2015-11-07 LAB — BASIC METABOLIC PANEL
Anion gap: 7 (ref 5–15)
BUN: 25 mg/dL — AB (ref 6–20)
CALCIUM: 8.8 mg/dL — AB (ref 8.9–10.3)
CO2: 25 mmol/L (ref 22–32)
CREATININE: 1.4 mg/dL — AB (ref 0.44–1.00)
Chloride: 104 mmol/L (ref 101–111)
GFR calc Af Amer: 45 mL/min — ABNORMAL LOW (ref >60)
GFR, EST NON AFRICAN AMERICAN: 38 mL/min — AB (ref >60)
GLUCOSE: 145 mg/dL — AB (ref 65–99)
Potassium: 3.5 mmol/L (ref 3.5–5.1)
Sodium: 136 mmol/L (ref 135–145)

## 2015-11-07 NOTE — Discharge Instructions (Signed)
As we discussed, your signs and symptoms strongly suggest a condition called paroxysmal supraventricular tachycardia (PSVT).  This is a generally benign condition, but we recommend you follow up with the listed cardiologist for further evaluation.  Please read through the included information and try some of the techniques we discussed the next time you have the symptoms.  Return to the Emergency Department if you develop new or worsening symptoms that concern you.   Palpitations A palpitation is the feeling that your heartbeat is irregular or is faster than normal. It may feel like your heart is fluttering or skipping a beat. Palpitations are usually not a serious problem. However, in some cases, you may need further medical evaluation. CAUSES  Palpitations can be caused by:  Smoking.  Caffeine or other stimulants, such as diet pills or energy drinks.  Alcohol.  Stress and anxiety.  Strenuous physical activity.  Fatigue.  Certain medicines.  Heart disease, especially if you have a history of irregular heart rhythms (arrhythmias), such as atrial fibrillation, atrial flutter, or supraventricular tachycardia.  An improperly working pacemaker or defibrillator. DIAGNOSIS  To find the cause of your palpitations, your health care provider will take your medical history and perform a physical exam. Your health care provider may also have you take a test called an ambulatory electrocardiogram (ECG). An ECG records your heartbeat patterns over a 24-hour period. You may also have other tests, such as:  Transthoracic echocardiogram (TTE). During echocardiography, sound waves are used to evaluate how blood flows through your heart.  Transesophageal echocardiogram (TEE).  Cardiac monitoring. This allows your health care provider to monitor your heart rate and rhythm in real time.  Holter monitor. This is a portable device that records your heartbeat and can help diagnose heart arrhythmias. It  allows your health care provider to track your heart activity for several days, if needed.  Stress tests by exercise or by giving medicine that makes the heart beat faster. TREATMENT  Treatment of palpitations depends on the cause of your symptoms and can vary greatly. Most cases of palpitations do not require any treatment other than time, relaxation, and monitoring your symptoms. Other causes, such as atrial fibrillation, atrial flutter, or supraventricular tachycardia, usually require further treatment. HOME CARE INSTRUCTIONS   Avoid:  Caffeinated coffee, tea, soft drinks, diet pills, and energy drinks.  Chocolate.  Alcohol.  Stop smoking if you smoke.  Reduce your stress and anxiety. Things that can help you relax include:  A method of controlling things in your body, such as your heartbeats, with your mind (biofeedback).  Yoga.  Meditation.  Physical activity such as swimming, jogging, or walking.  Get plenty of rest and sleep. SEEK MEDICAL CARE IF:   You continue to have a fast or irregular heartbeat beyond 24 hours.  Your palpitations occur more often. SEEK IMMEDIATE MEDICAL CARE IF:  You have chest pain or shortness of breath.  You have a severe headache.  You feel dizzy or you faint. MAKE SURE YOU:  Understand these instructions.  Will watch your condition.  Will get help right away if you are not doing well or get worse.   This information is not intended to replace advice given to you by your health care provider. Make sure you discuss any questions you have with your health care provider.   Document Released: 05/25/2000 Document Revised: 06/02/2013 Document Reviewed: 07/27/2011 Elsevier Interactive Patient Education [employer].

## 2015-11-07 NOTE — ED Provider Notes (Signed)
[facility] Emergency Department Provider Note  ____________________________________________  Time seen: Approximately 1:12 AM  I have reviewed the triage vital signs and the nursing notes.   HISTORY  Chief Complaint Palpitations    HPI Victoria Hanson is a 65 y.o. female who presents by private vehicle for evaluation of palpitations.  She reports that it started a few hours ago and continued for about 15 minutes.  She reports it was severe at the time and nothing made it better or worse but it quit on its own.  She took a full dose aspirin and the palpitations stopped after that she does not know if that is what stopped it or not.  She denies chest pain, shortness of breath, nausea, vomiting, diaphoresis, abdominal pain, dysuria.  She reports that she has had similar but milder symptoms in the past.  It was acute in onset.  She has an appointment in the next 2 weeks with Dr. Fletcher Anon for cardiology evaluation.  Past Medical History  Diagnosis Date  . Colon cancer ([place])     a. 1999: Pt had partial colectomy. Did develop lung metastasis. Had right upper lobectomy in 01/2007 then had associated chemotherapy.  . Hypercholesterolemia   . Hypertension   . Depression   . Nephrolithiasis   . Elevated transaminase level     a. ? NASH;  b. 06/2015 - nl LFTs.  . GERD (gastroesophageal reflux disease)   . Holter monitor, abnormal     a. 12/2009 Holter monitoring: NSR, rare PACs and PVCs.  . Echocardiogram abnormal     a. 02/2010 Echo: EF 55-60%, mild LAE, no regional wall motion abnormalities  . Lung cancer ([place])     a. S/P resection (lobectomy - right)  . COPD (chronic obstructive pulmonary disease) ([place])   . Anxiety   . DDD (degenerative disc disease), lumbosacral   . Hematuria   . Leaky heart valve     a. Per pt report - no evidence of valvular abnormalities on prior echoes.  . Atypical chest pain     a. 2003 Cath: reportedly nl per pt [name]);  b.  04/2015 [facility] Admission, neg troponin.  . Morbid obesity ([place])   . Hyperlipidemia     Patient Active Problem List   Diagnosis Date Noted  . Sinus pressure 08/03/2015  . Foot pain, bilateral 07/24/2015  . Hyperlipidemia 06/21/2015  . Atypical chest pain 06/21/2015  . Other fatigue 06/21/2015  . Morbid obesity due to excess calories ([place]) 06/21/2015  . Morbid obesity ([place])   . Abnormal mammogram 06/19/2015  . Urinary system disease 03/28/2015  . Hyperglycemia 03/20/2015  . Right lower quadrant abdominal pain 02/03/2015  . Swelling of lower extremity 11/20/2014  . H/O malignant neoplasm of colon 11/19/2014  . Dysuria 10/24/2014  . Flatulence, eructation and gas pain 10/24/2014  . Health care maintenance 07/17/2014  . Hypokalemia 04/05/2013  . TIA (transient ischemic attack) 02/09/2013  . Temporary cerebral vascular dysfunction 02/09/2013  . Chest pain at rest 05/07/2012  . Anxiety, mild 03/13/2012  . Depression, major, recurrent ([place]) 03/13/2012  . Lung cancer ([place]) 03/13/2012  . Colon cancer ([place]) 03/13/2012  . Hypertension 03/13/2012  . CKD (chronic kidney disease), stage III 03/13/2012  . GERD (gastroesophageal reflux disease) 03/13/2012  . Acid reflux 03/13/2012  . Essential (primary) hypertension 03/13/2012  . Cancer of lung ([place]) 03/13/2012  . Major depressive disorder with single episode ([place]) 03/13/2012  . Malignant neoplasm of colon ([place]) 03/13/2012  . Hypercholesterolemia  02/02/2010  . Pure hypercholesterolemia 02/02/2010    Past Surgical History  Procedure Laterality Date  . Colon surgery      Colon cancer, removed part of large colon  . Lobectomy      Right lung, upper lobe right side removed  . Cholecystectomy  2000  . Tubal ligation  1980  . Total abdominal hysterectomy      abnormal bleeding  . Breast biopsy  10/07/2009    Right breast  . Colonoscopy N/A 04/25/2015    Procedure: COLONOSCOPY;  Surgeon: Manya Silvas, MD;  Location: [facility] ENDOSCOPY;   Service: Endoscopy;  Laterality: N/A;  . Cardiac catheterization  2003    negative as per pt report    Current Outpatient Rx  Name  Route  Sig  Dispense  Refill  . albuterol (PROVENTIL HFA;VENTOLIN HFA) 108 (90 Base) MCG/ACT inhaler   Inhalation   Inhale 2 puffs into the lungs every 6 (six) hours as needed for wheezing.         Marland Kitchen ALPRAZolam (XANAX) 0.5 MG tablet   Oral   Take 1 tablet (0.5 mg total) by mouth daily as needed for anxiety. Patient taking differently: Take 0.5-1 mg by mouth 2 (two) times daily as needed for anxiety.    60 tablet   0   . aspirin EC 81 MG tablet   Oral   Take 81 mg by mouth daily.         . baclofen (LIORESAL) 10 MG tablet               . colestipol (COLESTID) 1 g tablet   Oral   Take 1 g by mouth 4 (four) times daily.         . Multiple Vitamin (MULTIVITAMIN) tablet   Oral   Take 1 tablet by mouth daily.         Marland Kitchen omeprazole (PRILOSEC) 20 MG capsule   Oral   Take 20 mg by mouth daily.         . potassium chloride SA (K-DUR,KLOR-CON) 20 MEQ tablet      TAKE 1 TABLET BY MOUTH TWICE A DAY.   60 tablet   3   . traZODone (DESYREL) 50 MG tablet   Oral   Take 75 mg by mouth at bedtime.          . triamterene-hydrochlorothiazide (MAXZIDE-25) 37.5-25 MG tablet   Oral   Take 1 tablet by mouth daily.           Allergies Macrobid; Antihistamines, diphenhydramine-type; Aspirin; Ceftin; Celecoxib; Cymbalta; Diphenhydramine; Escitalopram; Lexapro; Metronidazole; Zoloft; and Ciprofloxacin  Family History  Problem Relation Age of Onset  . Coronary artery disease Mother     died @ 23 of MI.  Marland Kitchen Heart attack Father 52    MI  . Cancer Father     lung - died in his mid-70's  . Hypertension Father   . Sudden death Brother   . Mental illness Mother   . Mental illness Brother   . Diabetes Mother   . Diabetes Father   . Heart attack Brother     MI @ 86, died @ age 23.    Social History Social History  Substance Use Topics   . Smoking status: Passive Smoke Exposure - Never Smoker  . Smokeless tobacco: Never Used     Comment: husband and sons smoked in her home.  . Alcohol Use: 0.6 oz/week    0 Standard drinks or equivalent, 1 Glasses  of wine per week     Comment: rare wine cooler.    Review of Systems Constitutional: No fever/chills Eyes: No visual changes. ENT: No sore throat. Cardiovascular: Denies chest pain.  +palpitations Respiratory: Denies shortness of breath. Gastrointestinal: No abdominal pain.  No nausea, no vomiting.  No diarrhea.  No constipation. Genitourinary: Negative for dysuria. Musculoskeletal: Negative for back pain. Skin: Negative for rash. Neurological: Negative for headaches, focal weakness or numbness.  10-point ROS otherwise negative.  ____________________________________________   PHYSICAL EXAM:  VITAL SIGNS: ED Triage Vitals  Enc Vitals Group     BP 11/06/15 2353 143/65 mmHg     Pulse Rate 11/06/15 2353 65     Resp 11/06/15 2353 18     Temp 11/06/15 2353 98.4 F (36.9 C)     Temp Source 11/06/15 2353 Oral     SpO2 11/06/15 2353 99 %     Weight 11/06/15 2353 191 lb (86.637 kg)     Height 11/06/15 2353 '5\' 4"'$  (1.626 m)     Head Cir --      Peak Flow --      Pain Score 11/06/15 2356 0     Pain Loc --      Pain Edu? --      Excl. in [place]? --     Constitutional: Alert and oriented. Well appearing and in no acute distress. Eyes: Conjunctivae are normal. PERRL. EOMI. Head: Atraumatic. Nose: No congestion/rhinnorhea. Mouth/Throat: Mucous membranes are moist.  Oropharynx non-erythematous. Neck: No stridor.  No meningeal signs.   Cardiovascular: Normal rate, regular rhythm. Good peripheral circulation. Grossly normal heart sounds.   Respiratory: Normal respiratory effort.  No retractions. Lungs CTAB. Gastrointestinal: Soft and nontender. No distention.  Musculoskeletal: No lower extremity tenderness nor edema. No gross deformities of extremities. Neurologic:  Normal  speech and language. No gross focal neurologic deficits are appreciated.  Skin:  Skin is warm, dry and intact. No rash noted. Psychiatric: Mood and affect are normal. Speech and behavior are normal.  ____________________________________________   LABS (all labs ordered are listed, but only abnormal results are displayed)  Labs Reviewed  BASIC METABOLIC PANEL - Abnormal; Notable for the following:    Glucose, Bld 145 (*)    BUN 25 (*)    Creatinine, Ser 1.40 (*)    Calcium 8.8 (*)    GFR calc non Af Amer 38 (*)    GFR calc Af Amer 45 (*)    All other components within normal limits  CBC - Abnormal; Notable for the following:    Hemoglobin 11.4 (*)    HCT 33.7 (*)    All other components within normal limits  TROPONIN I  MAGNESIUM  TROPONIN I   ____________________________________________  EKG  ED ECG REPORT I, Ziaire Hagos, the attending physician, personally viewed and interpreted this ECG.  Date: 11/06/2015 EKG Time: 23:55 Rate: 66 Rhythm: normal sinus rhythm QRS Axis: normal Intervals: normal ST/T Wave abnormalities: normal Conduction Disturbances: none Narrative Interpretation: unremarkable  ____________________________________________  RADIOLOGY   Dg Chest 2 View  11/07/2015  CLINICAL DATA:  Acute onset of palpitations and generalized chest pain. Initial encounter. EXAM: CHEST  2 VIEW COMPARISON:  Chest radiograph performed 10/20/2015 FINDINGS: The lungs are well-aerated. Minimal bibasilar atelectasis is noted. There is no evidence of pleural effusion or pneumothorax. The heart is borderline normal in size. No acute osseous abnormalities are seen. Clips are noted within the right upper quadrant, reflecting prior cholecystectomy. IMPRESSION: Minimal bibasilar atelectasis noted.  Lungs  otherwise clear. Electronically Signed   By: Garald Balding M.D.   On: 11/07/2015 00:39    ____________________________________________   PROCEDURES  Procedure(s) performed:  None  Critical Care performed: No ____________________________________________   INITIAL IMPRESSION / ASSESSMENT AND PLAN / ED COURSE  Pertinent labs & imaging results that were available during my care of the patient were reviewed by me and considered in my medical decision making (see chart for details).  Nonspecific sensation of palpitations but nothing captured on EKG or on the monitor.  2 troponins were negative.  I had my usual and customary palpitations discussion with the patient and she knows to follow up as an outpatient.  I gave my usual and customary precautions.   ____________________________________________  FINAL CLINICAL IMPRESSION(S) / ED DIAGNOSES  Final diagnoses:  Heart palpitations     MEDICATIONS GIVEN DURING THIS VISIT:  Medications - No data to display   NEW OUTPATIENT MEDICATIONS STARTED DURING THIS VISIT:  New Prescriptions   No medications on file      Note:  This document was prepared using Dragon voice recognition software and may include unintentional dictation errors.   Hinda Kehr, MD 11/07/15 [phone]

## 2015-11-07 NOTE — ED Notes (Signed)
Pt discharged to home with family.  Pt ambulatory at discharge, denies pain.  Discharge paperwork reviewed with patient.  Follow-up instructions reviewed with patient.  Pt verbalized understanding

## 2015-11-07 NOTE — ED Notes (Signed)
Pt reports she started to have palpitations around 2130 that continued without relief. Pt denies cardiac hx other then "leaky valves." Upon assessment pt is currently not experiencing palpitations.

## 2015-11-17 ENCOUNTER — Ambulatory Visit: Payer: Medicare PPO

## 2015-11-21 ENCOUNTER — Ambulatory Visit: Payer: Medicare PPO | Admitting: Cardiovascular Disease

## 2015-12-01 IMAGING — CR DG KNEE COMPLETE 4+V*R*
1 series · 4 of 4 positions shown · non-contrast
Comparison: None.

CLINICAL DATA: Right knee pain for 6 months, initial encounter

EXAM:
RIGHT KNEE - COMPLETE 4+ VIEW

[Series 1: dxr knee rt comp with obliques · 0.14mm/px · 4 of 4 slices shown]
[im 1/4]
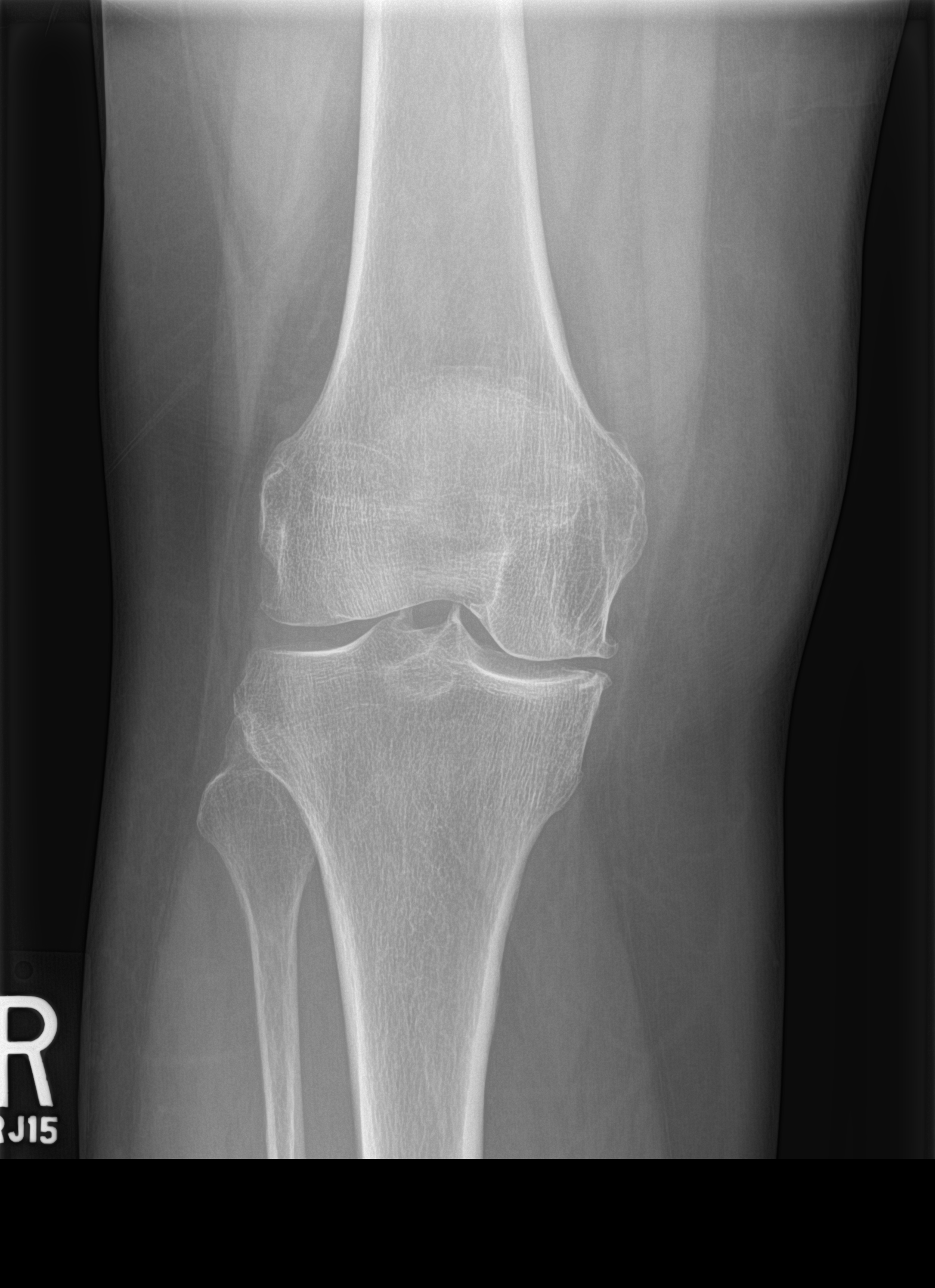
[im 2/4]
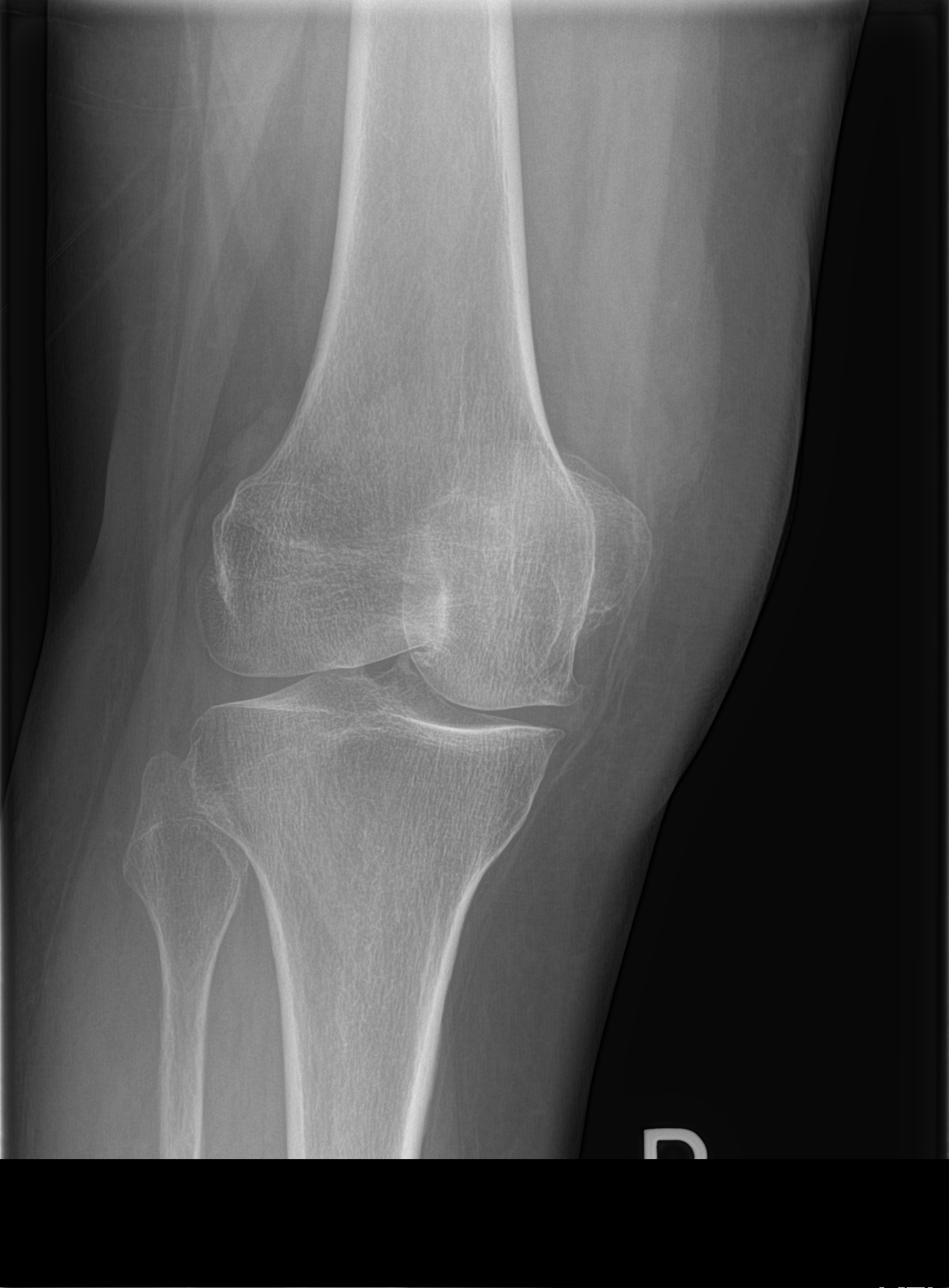
[im 3/4]
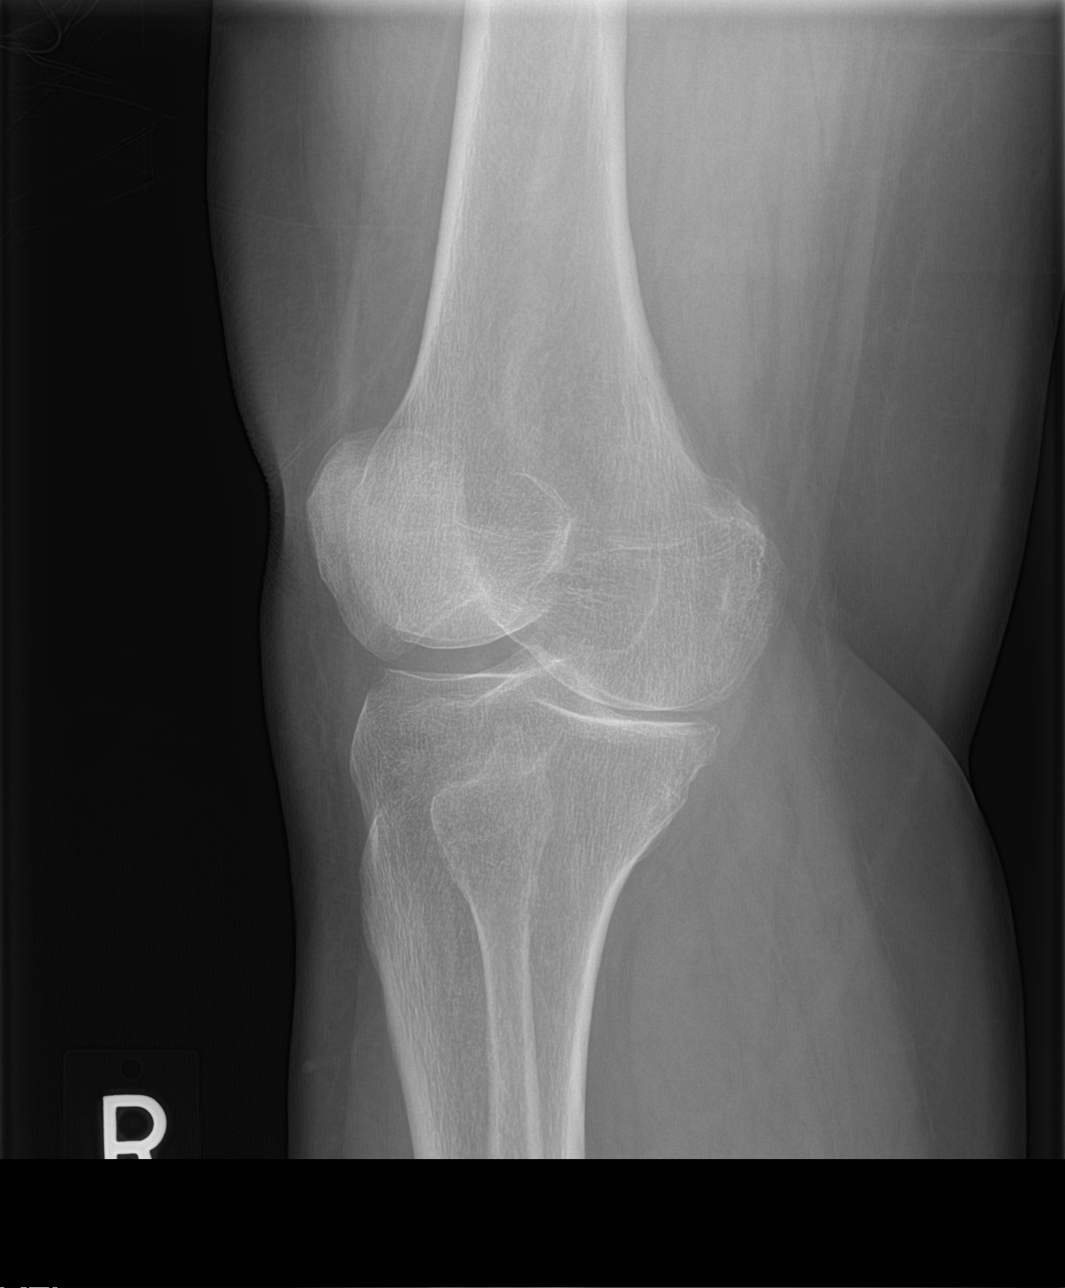
[im 4/4]
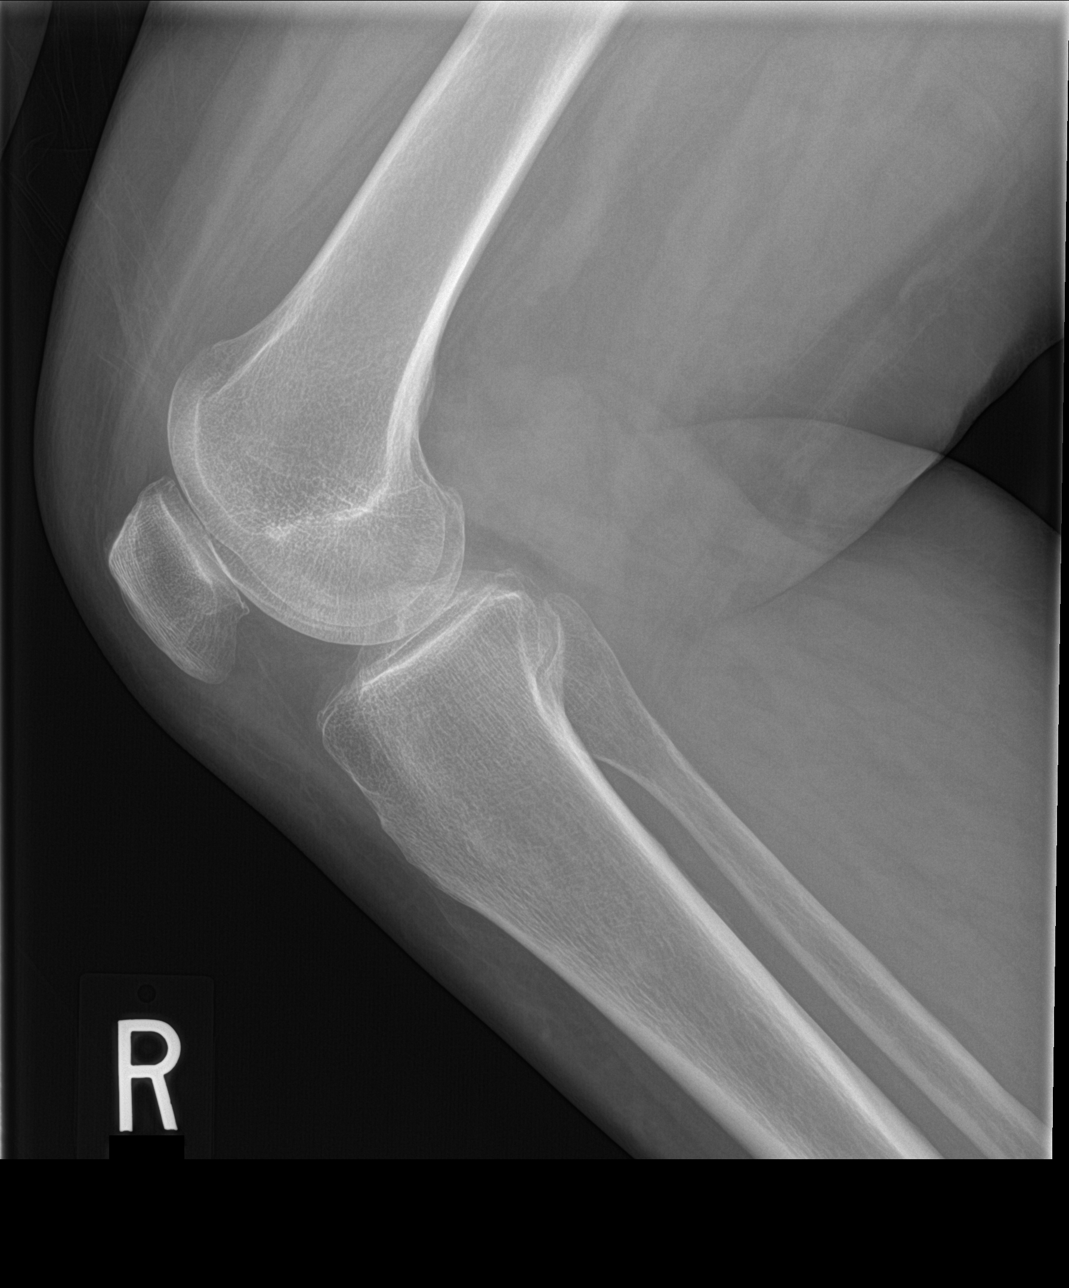

[4 of 4 positions shown; findings below may reference images not displayed]

FINDINGS: Degenerative changes are noted in the medial joint space with
narrowing and osteophytic change. No acute fracture or dislocation
is noted. No joint effusion is seen. The patella is within normal
limits.
IMPRESSION: Degenerative change medially

## 2015-12-01 IMAGING — CR DG LUMBAR SPINE COMPLETE 4+V
1 series · 5 of 5 positions shown · non-contrast
Comparison: None.

CLINICAL DATA: Low back pain for 6 months without known injury,
initial encounter

EXAM:
LUMBAR SPINE - COMPLETE 4+ VIEW

[Series 1: dxr lumbar spine with obliques · 0.14mm/px · 5 of 5 slices shown]
[im 1/5]
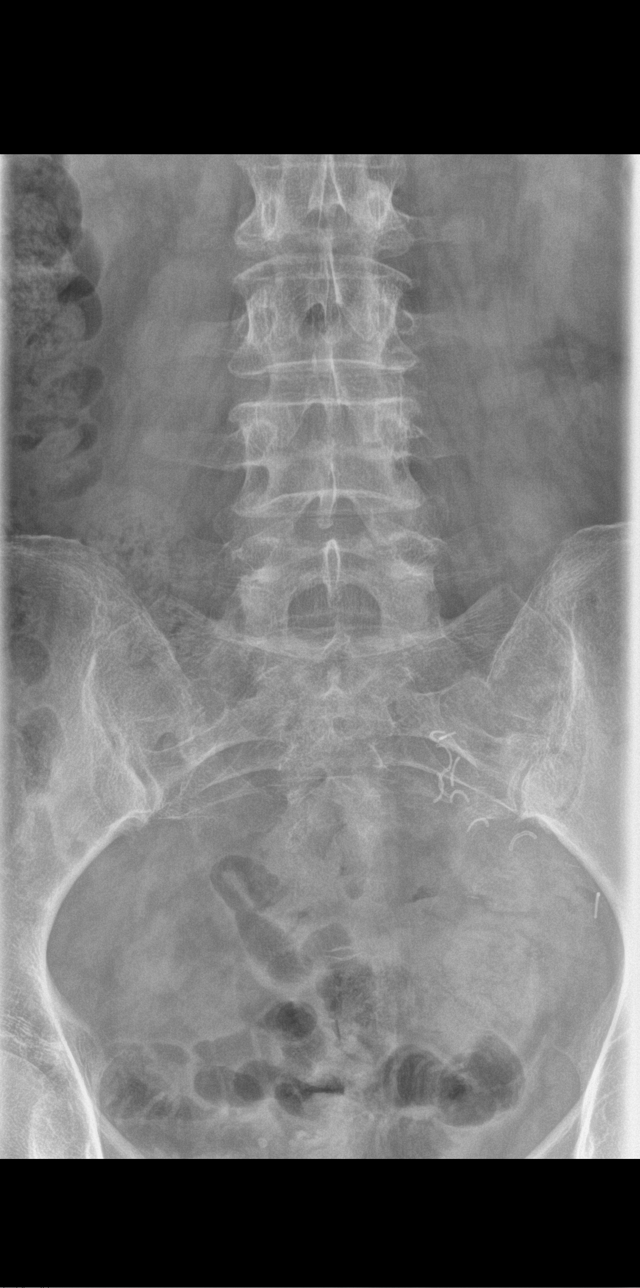
[im 2/5]
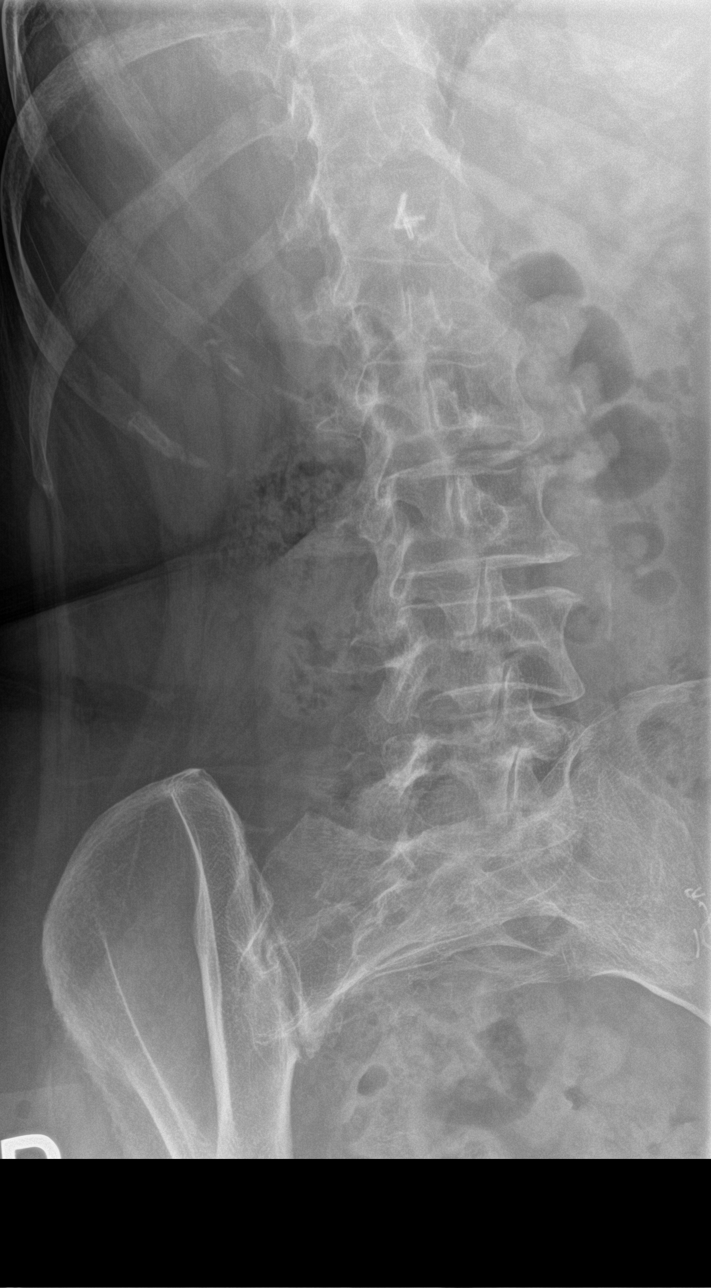
[im 3/5]
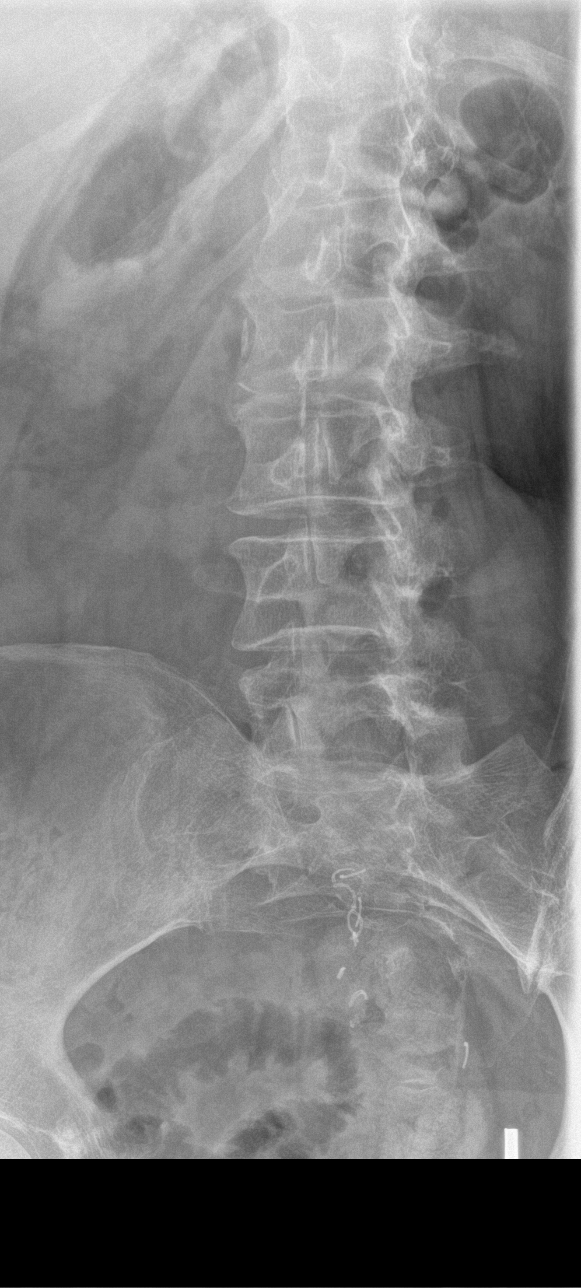
[im 4/5]
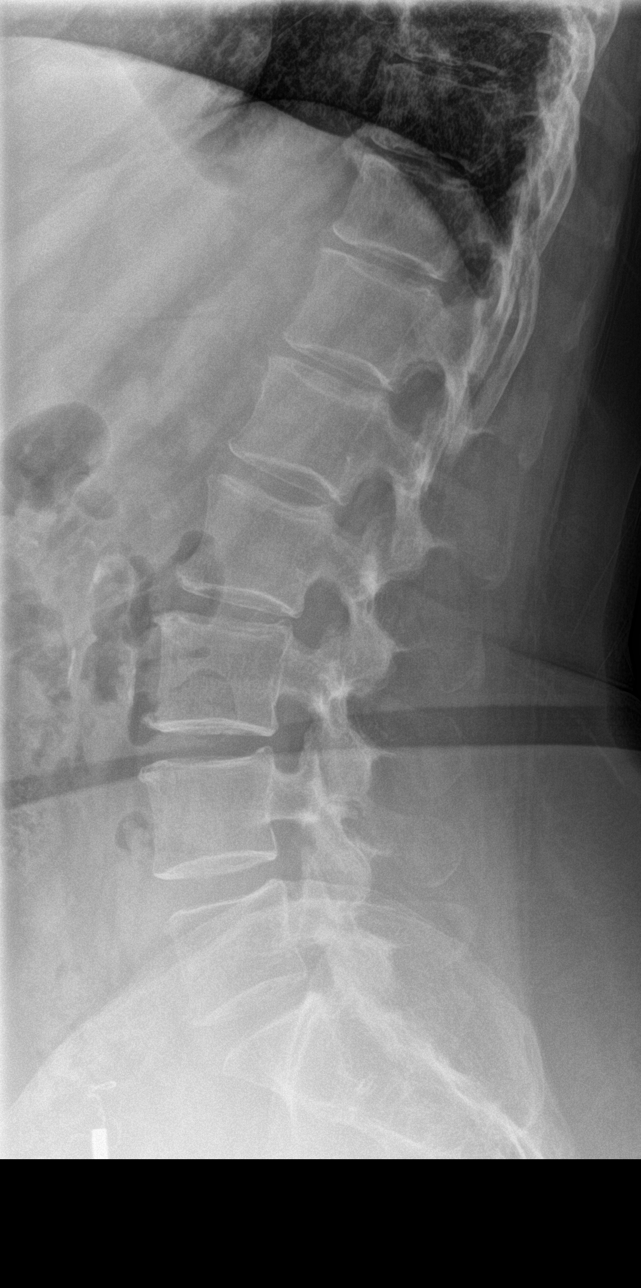
[im 5/5]
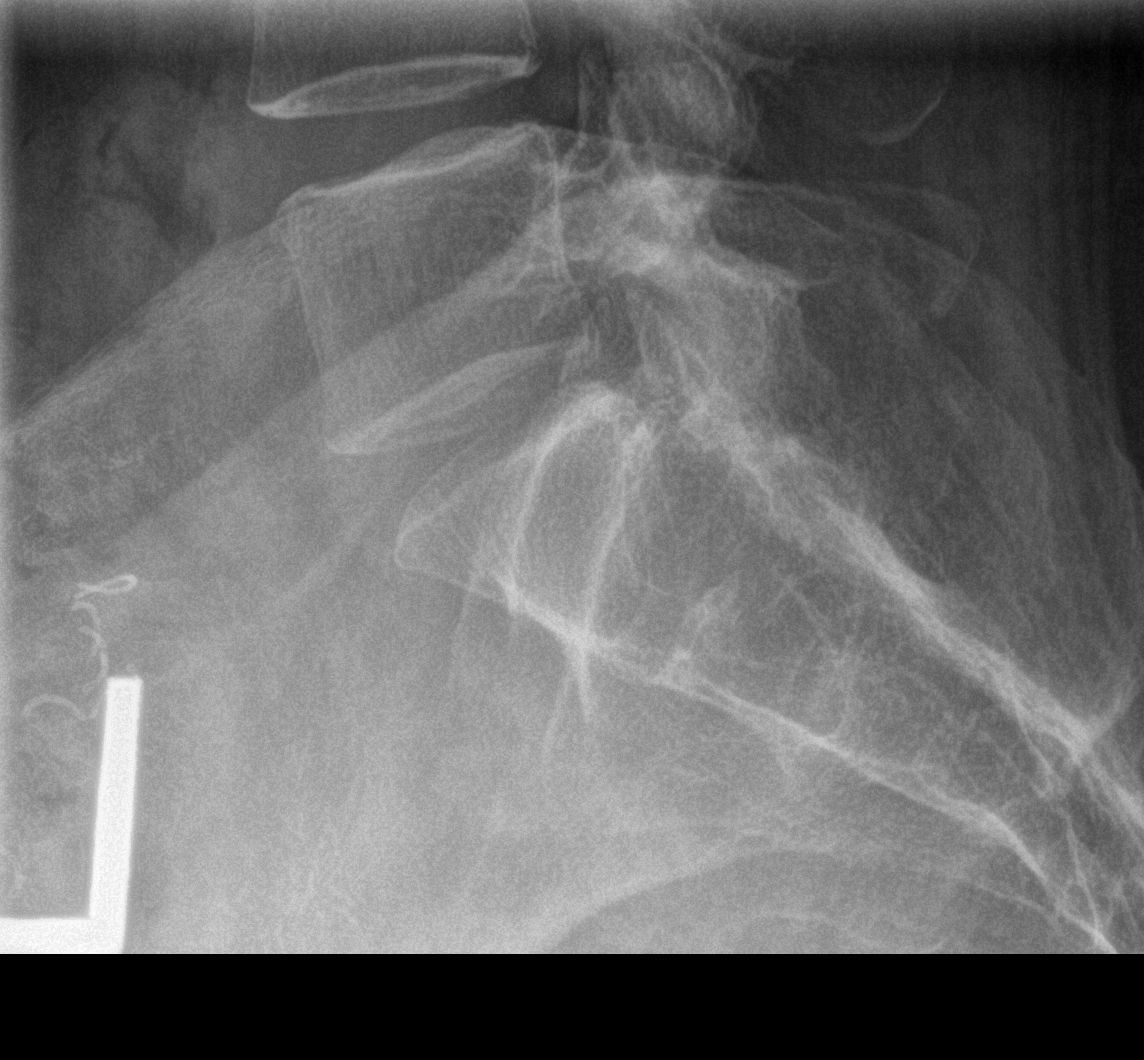

[5 of 5 positions shown; findings below may reference images not displayed]

FINDINGS: Five lumbar type vertebral bodies are well visualized. Vertebral
body height is well maintained. No spondylolysis or
spondylolisthesis is seen. No gross soft tissue abnormality is
noted. Minimal osteophytic changes are seen.
IMPRESSION: Mild degenerative change without acute abnormality.

## 2015-12-01 IMAGING — CR RIGHT HIP - COMPLETE 2+ VIEW
1 series · 3 of 3 positions shown · non-contrast
Comparison: None.

CLINICAL DATA: Right hip pain for 6 months without injury, initial
encounter

EXAM:
RIGHT HIP - COMPLETE 2+ VIEW

[Series 1: dxr hip right complete · 0.14mm/px · 3 of 3 slices shown]
[im 1/3]
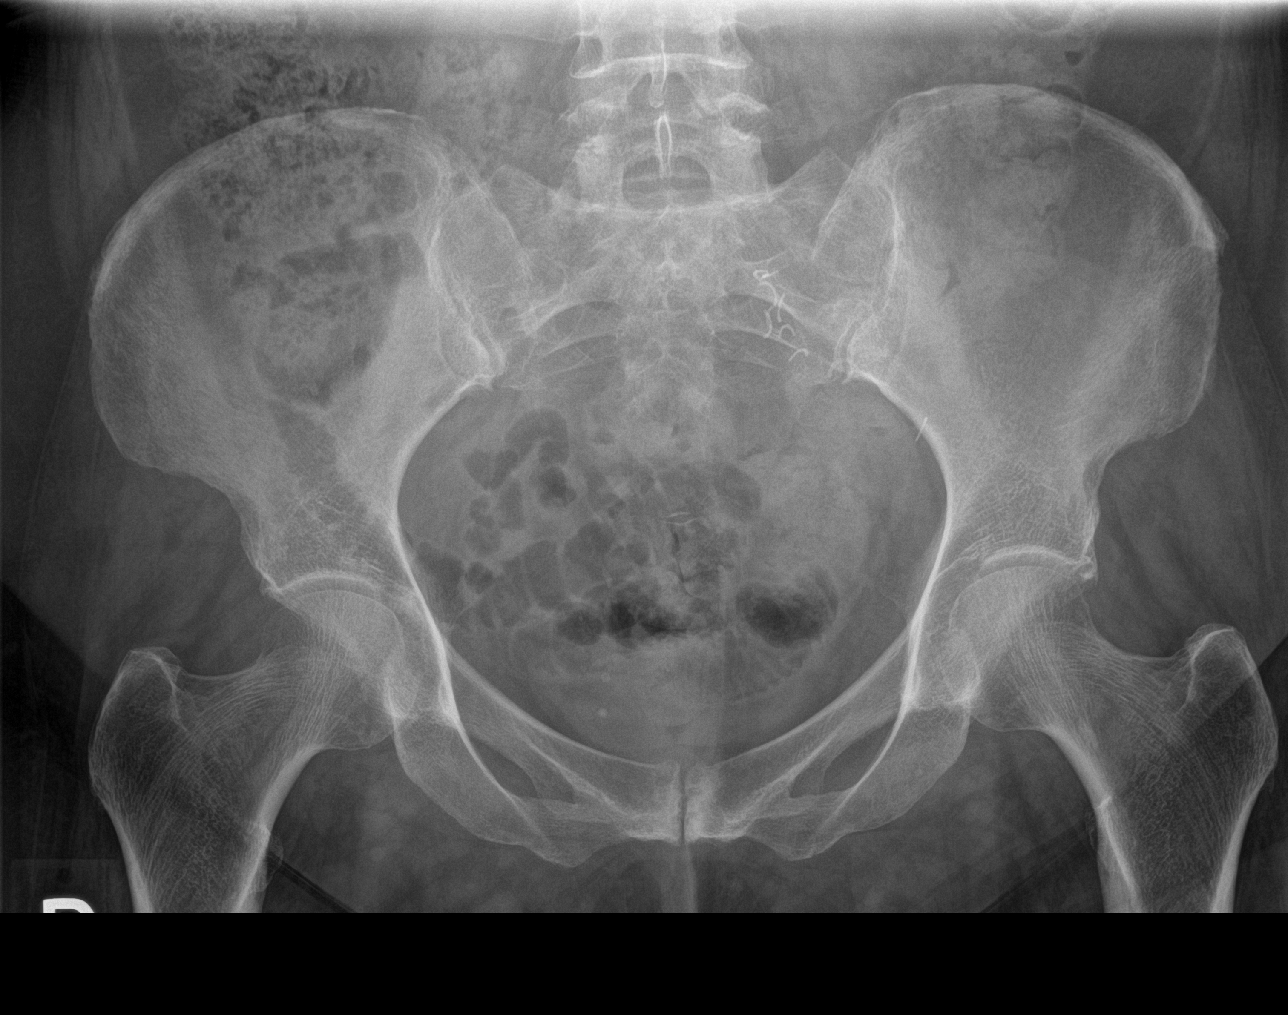
[im 2/3]
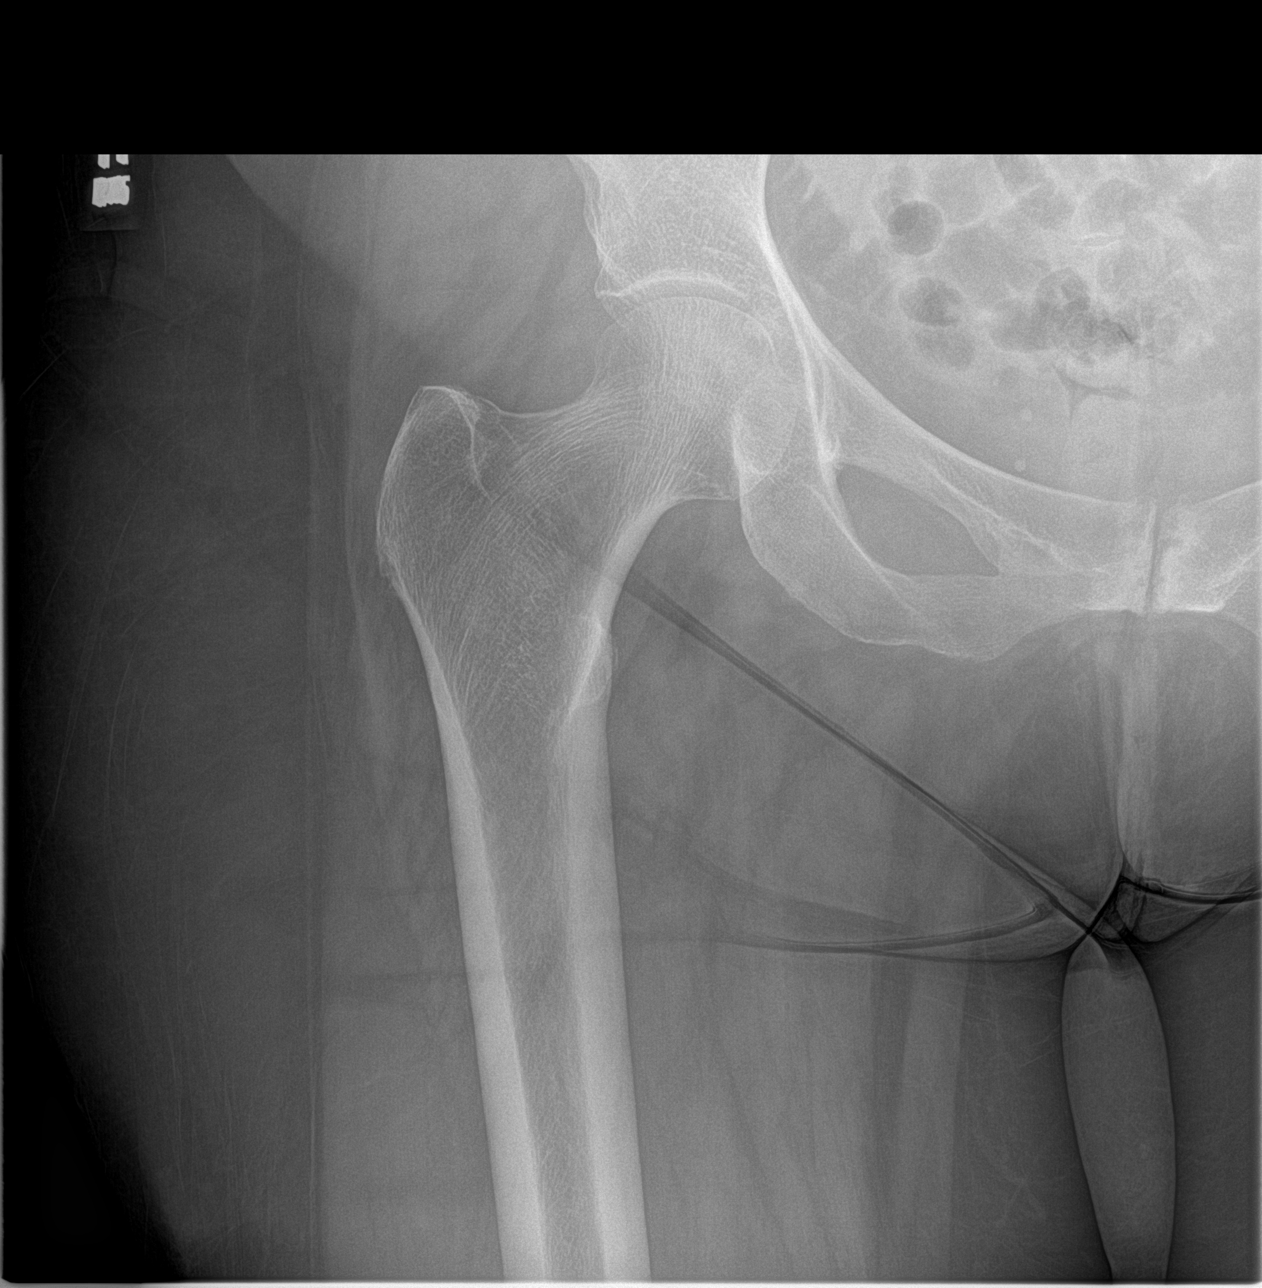
[im 3/3]
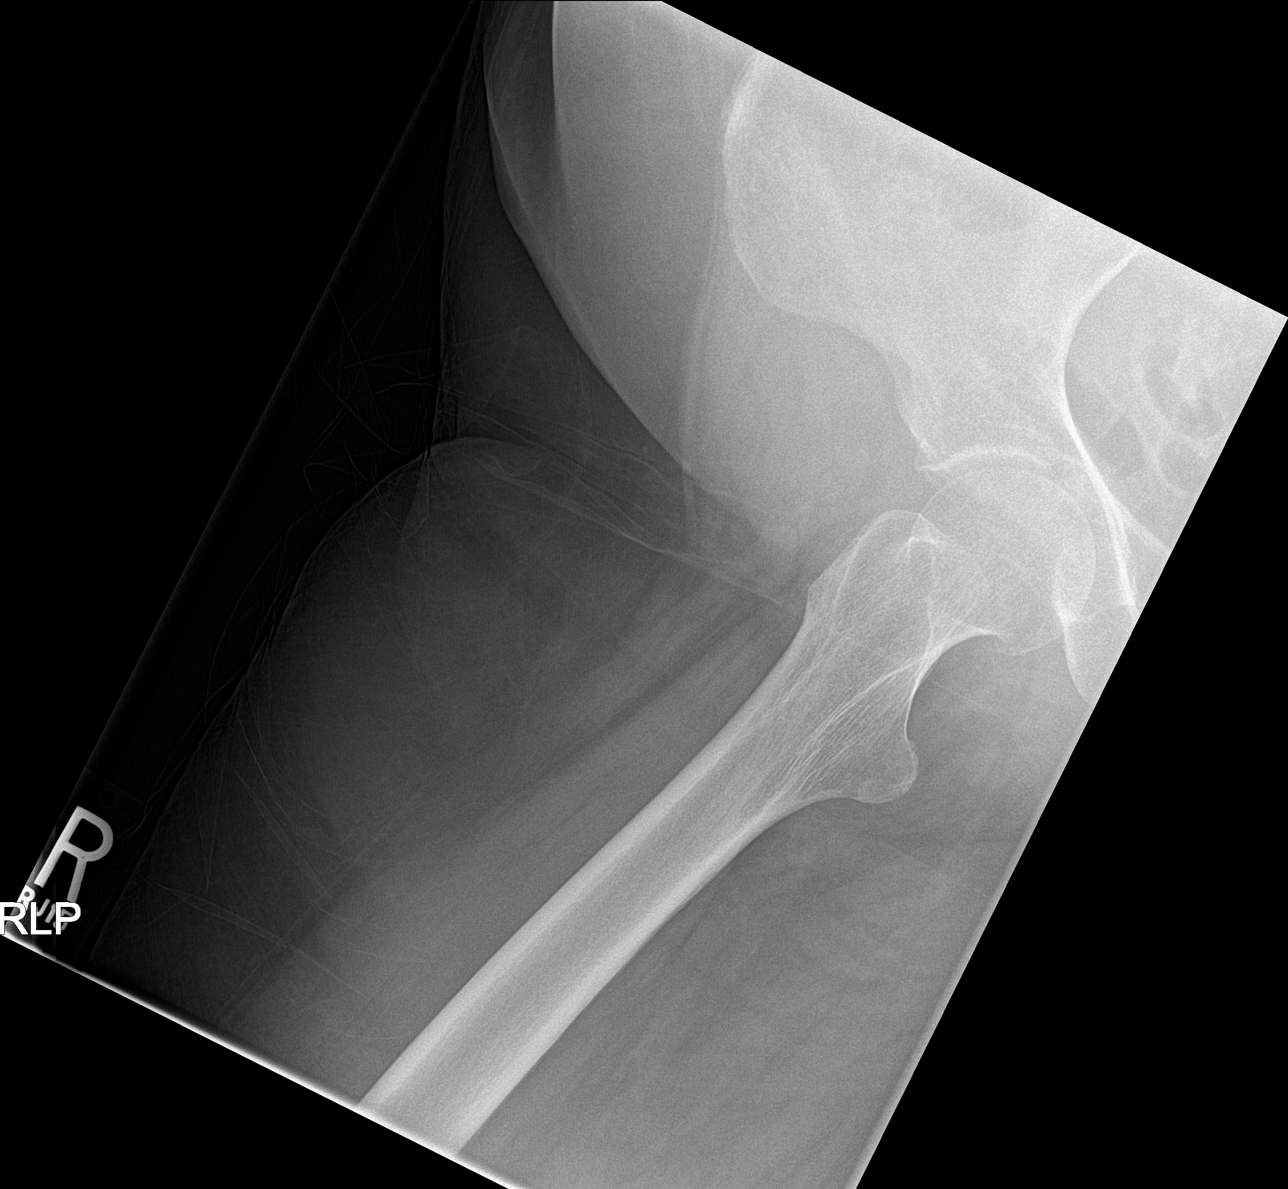

[3 of 3 positions shown; findings below may reference images not displayed]

FINDINGS: Mild degenerative changes of the hip joints are noted bilaterally.
The pelvic ring is intact. Degenerative changes of the pubic
symphysis are seen. No acute fracture or dislocation is noted. No
soft tissue changes are seen.
IMPRESSION: Degenerative change without acute abnormality.

## 2015-12-05 ENCOUNTER — Ambulatory Visit (INDEPENDENT_AMBULATORY_CARE_PROVIDER_SITE_OTHER): Payer: Medicare PPO | Admitting: Internal Medicine

## 2015-12-05 ENCOUNTER — Encounter: Payer: Self-pay | Admitting: Internal Medicine

## 2015-12-05 VITALS — BP 110/70 | HR 68 | Temp 98.1°F | Resp 18 | Ht 62.75 in | Wt 192.1 lb

## 2015-12-05 DIAGNOSIS — C3490 Malignant neoplasm of unspecified part of unspecified bronchus or lung: Secondary | ICD-10-CM

## 2015-12-05 DIAGNOSIS — C189 Malignant neoplasm of colon, unspecified: Secondary | ICD-10-CM | POA: Diagnosis not present

## 2015-12-05 DIAGNOSIS — E7800 Pure hypercholesterolemia, unspecified: Secondary | ICD-10-CM

## 2015-12-05 DIAGNOSIS — N183 Chronic kidney disease, stage 3 unspecified: Secondary | ICD-10-CM

## 2015-12-05 DIAGNOSIS — F419 Anxiety disorder, unspecified: Secondary | ICD-10-CM | POA: Diagnosis not present

## 2015-12-05 DIAGNOSIS — I10 Essential (primary) hypertension: Secondary | ICD-10-CM

## 2015-12-05 DIAGNOSIS — M7989 Other specified soft tissue disorders: Secondary | ICD-10-CM

## 2015-12-05 DIAGNOSIS — D649 Anemia, unspecified: Secondary | ICD-10-CM

## 2015-12-05 DIAGNOSIS — R739 Hyperglycemia, unspecified: Secondary | ICD-10-CM

## 2015-12-05 DIAGNOSIS — K219 Gastro-esophageal reflux disease without esophagitis: Secondary | ICD-10-CM

## 2015-12-05 MED ORDER — TRIAMTERENE-HCTZ 37.5-25 MG PO TABS: 1.0000 | ORAL_TABLET | Freq: Every day | ORAL | Status: DC

## 2015-12-05 MED ORDER — ALPRAZOLAM 0.5 MG PO TABS: 0.5000 mg | ORAL_TABLET | Freq: Every day | ORAL | Status: DC | PRN

## 2015-12-05 MED ORDER — TRAZODONE HCL 50 MG PO TABS: 75.0000 mg | ORAL_TABLET | Freq: Every day | ORAL | Status: DC

## 2015-12-05 NOTE — Progress Notes (Signed)
Patient ID: Victoria Hanson, female   DOB: 07/13/1950, 65 y.o.   MRN: [id number]   Subjective:    Patient ID: Victoria Hanson, female    DOB: 10-May-1951, 65 y.o.   MRN: [id number]  HPI  Patient here for a scheduled follow up.  She feels she is doing better.  Handling stress better.  Not requiring xanax now.  Taking trazodone.  Sleeping better.  No chest pain.  Breathing stable.  No acid reflux.  No abdominal pain or cramping.  Bowels stable.  Headache - better/resolved.    Past Medical History  Diagnosis Date  . Colon cancer ([place])     a. 1999: Pt had partial colectomy. Did develop lung metastasis. Had right upper lobectomy in 01/2007 then had associated chemotherapy.  . Hypercholesterolemia   . Hypertension   . Depression   . Nephrolithiasis   . Elevated transaminase level     a. ? NASH;  b. 06/2015 - nl LFTs.  . GERD (gastroesophageal reflux disease)   . Holter monitor, abnormal     a. 12/2009 Holter monitoring: NSR, rare PACs and PVCs.  . Echocardiogram abnormal     a. 02/2010 Echo: EF 55-60%, mild LAE, no regional wall motion abnormalities  . Lung cancer ([place])     a. S/P resection (lobectomy - right)  . COPD (chronic obstructive pulmonary disease) ([place])   . Anxiety   . DDD (degenerative disc disease), lumbosacral   . Hematuria   . Leaky heart valve     a. Per pt report - no evidence of valvular abnormalities on prior echoes.  . Atypical chest pain     a. 2003 Cath: reportedly nl per pt [name]);  b. 04/2015 [facility] Admission, neg troponin.  . Morbid obesity ([place])   . Hyperlipidemia    Past Surgical History  Procedure Laterality Date  . Colon surgery      Colon cancer, removed part of large colon  . Lobectomy      Right lung, upper lobe right side removed  . Cholecystectomy  2000  . Tubal ligation  1980  . Total abdominal hysterectomy      abnormal bleeding  . Breast biopsy  10/07/2009    Right breast  . Colonoscopy N/A 04/25/2015    Procedure: COLONOSCOPY;  Surgeon:  Manya Silvas, MD;  Location: [facility] ENDOSCOPY;  Service: Endoscopy;  Laterality: N/A;  . Cardiac catheterization  2003    negative as per pt report   Family History  Problem Relation Age of Onset  . Coronary artery disease Mother     died @ 60 of MI.  . Mental illness Mother   . Diabetes Mother   . Heart attack Father 72    MI  . Cancer Father     lung - died in his mid-70's  . Hypertension Father   . Diabetes Father   . Sudden death Brother   . Mental illness Brother   . Heart attack Brother     MI @ 23, died @ age 49.  . Breast cancer Neg Hx    Social History   Social History  . Marital Status: Widowed    Spouse Name: N/A  . Number of Children: N/A  . Years of Education: N/A   Social History Main Topics  . Smoking status: Passive Smoke Exposure - Never Smoker  . Smokeless tobacco: Never Used     Comment: husband and sons smoked in her home.  . Alcohol Use: 0.6 oz/week  0 Standard drinks or equivalent, 1 Glasses of wine per week     Comment: rare wine cooler.  . Drug Use: No  . Sexual Activity: Not Asked   Other Topics Concern  . None   Social History Narrative   Widowed.   Disabled (colon/lung CA), retired.   Lives in [place] with her dtr, grand-dtr, and son.   Active.   Gets regular exercise, walks.    Outpatient Encounter Prescriptions as of 12/05/2015  Medication Sig  . albuterol (PROVENTIL HFA;VENTOLIN HFA) 108 (90 Base) MCG/ACT inhaler Inhale 2 puffs into the lungs every 6 (six) hours as needed for wheezing.  Marland Kitchen ALPRAZolam (XANAX) 0.5 MG tablet Take 1 tablet (0.5 mg total) by mouth daily as needed for anxiety.  Marland Kitchen aspirin EC 81 MG tablet Take 81 mg by mouth daily.  . colestipol (COLESTID) 1 g tablet Take 1 g by mouth 4 (four) times daily.  . Multiple Vitamin (MULTIVITAMIN) tablet Take 1 tablet by mouth daily.  Marland Kitchen omeprazole (PRILOSEC) 20 MG capsule Take 20 mg by mouth daily.  . potassium chloride SA (K-DUR,KLOR-CON) 20 MEQ tablet TAKE 1 TABLET  BY MOUTH TWICE A DAY.  . traZODone (DESYREL) 50 MG tablet Take 1.5 tablets (75 mg total) by mouth at bedtime.  . triamterene-hydrochlorothiazide (MAXZIDE-25) 37.5-25 MG tablet Take 1 tablet by mouth daily.  . [DISCONTINUED] ALPRAZolam (XANAX) 0.5 MG tablet Take 1 tablet (0.5 mg total) by mouth daily as needed for anxiety. (Patient taking differently: Take 0.5-1 mg by mouth 2 (two) times daily as needed for anxiety. )  . [DISCONTINUED] traZODone (DESYREL) 50 MG tablet Take 75 mg by mouth at bedtime.   . [DISCONTINUED] triamterene-hydrochlorothiazide (MAXZIDE-25) 37.5-25 MG tablet Take 1 tablet by mouth daily.  . [DISCONTINUED] baclofen (LIORESAL) 10 MG tablet    No facility-administered encounter medications on file as of 12/05/2015.    Review of Systems  Constitutional: Negative for appetite change and unexpected weight change.  HENT: Negative for congestion and sinus pressure.   Respiratory: Negative for cough, chest tightness and shortness of breath.   Cardiovascular: Negative for chest pain, palpitations and leg swelling.  Gastrointestinal: Negative for nausea, vomiting, abdominal pain and diarrhea.  Genitourinary: Negative for dysuria and difficulty urinating.  Musculoskeletal: Negative for back pain and joint swelling.  Skin: Negative for color change and rash.  Neurological: Negative for dizziness, light-headedness and headaches.  Psychiatric/Behavioral: Negative for dysphoric mood and agitation.       Objective:     Blood pressure rechecked by me:  112/72  Physical Exam  Constitutional: She appears well-developed and well-nourished. No distress.  HENT:  Nose: Nose normal.  Mouth/Throat: Oropharynx is clear and moist.  Neck: Neck supple. No thyromegaly present.  Cardiovascular: Normal rate and regular rhythm.   Pulmonary/Chest: Breath sounds normal. No respiratory distress. She has no wheezes.  Abdominal: Soft. Bowel sounds are normal. There is no tenderness.    Musculoskeletal: She exhibits no edema or tenderness.  Lymphadenopathy:    She has no cervical adenopathy.  Skin: No rash noted. No erythema.  Psychiatric: She has a normal mood and affect. Her behavior is normal.    BP 110/70 mmHg  Pulse 68  Temp(Src) 98.1 F (36.7 C) (Oral)  Resp 18  Ht 5' 2.75" (1.594 m)  Wt 192 lb 2 oz (87.147 kg)  BMI 34.30 kg/m2  SpO2 98% Wt Readings from Last 3 Encounters:  12/05/15 192 lb 2 oz (87.147 kg)  11/06/15 191 lb (86.637 kg)  10/11/15 191 lb 4 oz (86.75 kg)     Lab Results  Component Value Date   WBC 9.4 11/07/2015   HGB 11.4* 11/07/2015   HCT 33.7* 11/07/2015   PLT 210 11/07/2015   GLUCOSE 145* 11/07/2015   CHOL 200 06/16/2015   TRIG 143.0 06/16/2015   HDL 50.30 06/16/2015   LDLDIRECT 163.4 12/31/2012   LDLCALC 121* 06/16/2015   ALT 17 06/16/2015   AST 19 06/16/2015   NA 136 11/07/2015   K 3.5 11/07/2015   CL 104 11/07/2015   CREATININE 1.40* 11/07/2015   BUN 25* 11/07/2015   CO2 25 11/07/2015   TSH 3.393 05/04/2015   INR 1.0 12/12/2012   HGBA1C 6.0 06/16/2015    Dg Chest 2 View  11/07/2015  CLINICAL DATA:  Acute onset of palpitations and generalized chest pain. Initial encounter. EXAM: CHEST  2 VIEW COMPARISON:  Chest radiograph performed 10/20/2015 FINDINGS: The lungs are well-aerated. Minimal bibasilar atelectasis is noted. There is no evidence of pleural effusion or pneumothorax. The heart is borderline normal in size. No acute osseous abnormalities are seen. Clips are noted within the right upper quadrant, reflecting prior cholecystectomy. IMPRESSION: Minimal bibasilar atelectasis noted.  Lungs otherwise clear. Electronically Signed   By: Garald Balding M.D.   On: 11/07/2015 00:39       Assessment & Plan:   Problem List Items Addressed This Visit    Anxiety, mild    Doing better.  Not requiring xanax now.  Follow.       Relevant Medications   traZODone (DESYREL) 50 MG tablet   ALPRAZolam (XANAX) 0.5 MG tablet    CKD (chronic kidney disease), stage III    Last creatinine check 1.40.  Stay hydrated.  Recheck metabolic panel.        Colon cancer ([place])    History of colon cancer.  Colonoscopy 07/08/13 - internal hemorrhoids.  Recommend f/u colonoscopy in three years.        Relevant Medications   ALPRAZolam (XANAX) 0.5 MG tablet   GERD (gastroesophageal reflux disease)    On omeprazole.  Symptoms controlled.       Hypercholesterolemia - Primary    Low cholesterol diet and exercise.  Follow lipid panel.        Relevant Medications   triamterene-hydrochlorothiazide (MAXZIDE-25) 37.5-25 MG tablet   Other Relevant Orders   Lipid panel   Hepatic function panel   Hyperglycemia    Low carb diet and exercise.  Follow met b and a1c.       Relevant Orders   Hemoglobin A1c   Hypertension    Blood pressure under good control.  Continue same medication regimen.  Follow pressures.  Follow metabolic panel.        Relevant Medications   triamterene-hydrochlorothiazide (MAXZIDE-25) 37.5-25 MG tablet   Other Relevant Orders   CBC with Differential/Platelet   Basic metabolic panel   Lung cancer ([place])    Has been followed by Dr Oliva Bustard.  Breathing stable.        Relevant Medications   ALPRAZolam (XANAX) 0.5 MG tablet   Swelling of lower extremity    No significant swelling noted on exam.  Leg elevation.  Follow.        Other Visit Diagnoses    Anemia, unspecified anemia type        Relevant Orders    Ferritin        Einar Pheasant, MD

## 2015-12-05 NOTE — Progress Notes (Signed)
Pre-visit discussion using our clinic review tool. No additional management support is needed unless otherwise documented below in the visit note.  

## 2015-12-07 ENCOUNTER — Ambulatory Visit
Admission: RE | Admit: 2015-12-07 | Discharge: 2015-12-07 | Disposition: A | Discharge status: Home / Self Care | Payer: Medicare PPO | Source: Ambulatory Visit | Attending: Oncology | Admitting: Oncology

## 2015-12-07 ENCOUNTER — Ambulatory Visit
Admission: RE | Admit: 2015-12-07 | Discharge: 2015-12-07 | Disposition: A | Discharge status: Home / Self Care | Payer: Medicare PPO | Source: Ambulatory Visit | Attending: Internal Medicine | Admitting: Internal Medicine

## 2015-12-07 ENCOUNTER — Other Ambulatory Visit: Payer: Self-pay | Admitting: Oncology

## 2015-12-07 DIAGNOSIS — M810 Age-related osteoporosis without current pathological fracture: Secondary | ICD-10-CM | POA: Diagnosis not present

## 2015-12-07 DIAGNOSIS — Z1231 Encounter for screening mammogram for malignant neoplasm of breast: Secondary | ICD-10-CM

## 2015-12-07 DIAGNOSIS — E2839 Other primary ovarian failure: Secondary | ICD-10-CM | POA: Diagnosis present

## 2015-12-07 DIAGNOSIS — M85851 Other specified disorders of bone density and structure, right thigh: Secondary | ICD-10-CM | POA: Insufficient documentation

## 2015-12-08 ENCOUNTER — Encounter: Payer: Self-pay | Admitting: *Deleted

## 2015-12-08 ENCOUNTER — Other Ambulatory Visit: Payer: Self-pay | Admitting: Internal Medicine

## 2015-12-11 ENCOUNTER — Encounter: Payer: Self-pay | Admitting: Internal Medicine

## 2015-12-11 NOTE — Assessment & Plan Note (Signed)
On omeprazole.  Symptoms controlled.  

## 2015-12-11 NOTE — Assessment & Plan Note (Signed)
No significant swelling noted on exam.  Leg elevation.  Follow.

## 2015-12-11 NOTE — Assessment & Plan Note (Signed)
Has been followed by Dr Oliva Bustard.  Breathing stable.

## 2015-12-11 NOTE — Assessment & Plan Note (Signed)
Blood pressure under good control.  Continue same medication regimen.  Follow pressures.  Follow metabolic panel.   

## 2015-12-11 NOTE — Assessment & Plan Note (Signed)
Low carb diet and exercise.  Follow met b and a1c.  

## 2015-12-11 NOTE — Assessment & Plan Note (Signed)
Low cholesterol diet and exercise.  Follow lipid panel.   

## 2015-12-11 NOTE — Assessment & Plan Note (Signed)
Doing better.  Not requiring xanax now.  Follow.

## 2015-12-11 NOTE — Assessment & Plan Note (Signed)
History of colon cancer.  Colonoscopy 07/08/13 - internal hemorrhoids.  Recommend f/u colonoscopy in three years.

## 2015-12-11 NOTE — Assessment & Plan Note (Signed)
Last creatinine check 1.40.  Stay hydrated.  Recheck metabolic panel.

## 2015-12-15 ENCOUNTER — Ambulatory Visit (INDEPENDENT_AMBULATORY_CARE_PROVIDER_SITE_OTHER): Payer: Medicare PPO | Admitting: Cardiovascular Disease

## 2015-12-15 ENCOUNTER — Encounter (INDEPENDENT_AMBULATORY_CARE_PROVIDER_SITE_OTHER): Payer: Self-pay

## 2015-12-15 ENCOUNTER — Encounter: Payer: Self-pay | Admitting: Cardiovascular Disease

## 2015-12-15 VITALS — BP 124/74 | HR 72 | Ht 63.0 in | Wt 191.8 lb

## 2015-12-15 DIAGNOSIS — R002 Palpitations: Secondary | ICD-10-CM

## 2015-12-15 NOTE — Patient Instructions (Addendum)
Medication Instructions:  Your physician recommends that you continue on your current medications as directed. Please refer to the Current Medication list given to you today.   Labwork: none  Testing/Procedures: Your physician has recommended that you wear a 48 hour holter monitor. Holter monitors are medical devices that record the heart's electrical activity. Doctors most often use these monitors to diagnose arrhythmias. Arrhythmias are problems with the speed or rhythm of the heartbeat. The monitor is a small, portable device. You can wear one while you do your normal daily activities. This is usually used to diagnose what is causing palpitations/syncope (passing out).    Follow-Up: Your physician recommends that you schedule a follow-up appointment as needed.   Any Other Special Instructions Will Be Listed Below (If Applicable).     If you need a refill on your cardiac medications before your next appointment, please call your pharmacy.  Holter Monitoring A Holter monitor is a small device that is used to detect abnormal heart rhythms. It clips to your clothing and is connected by wires to flat, sticky disks (electrodes) that attach to your chest. It is worn continuously for 24-48 hours. HOME CARE INSTRUCTIONS  Wear your Holter monitor at all times, even while exercising and sleeping, for as long as directed by your health care provider.  Make sure that the Holter monitor is safely clipped to your clothing or close to your body as recommended by your health care provider.  Do not get the monitor or wires wet.  Do not put body lotion or moisturizer on your chest.  Keep your skin clean.  Keep a diary of your daily activities, such as walking and doing chores. If you feel that your heartbeat is abnormal or that your heart is fluttering or skipping a beat:  Record what you are doing when it happens.  Record what time of day the symptoms occur.  Return your Holter monitor as  directed by your health care provider.  Keep all follow-up visits as directed by your health care provider. This is important. SEEK IMMEDIATE MEDICAL CARE IF:  You feel lightheaded or you faint.  You have trouble breathing.  You feel pain in your chest, upper arm, or jaw.  You feel sick to your stomach and your skin is pale, cool, or damp.  You heartbeat feels unusual or abnormal.   This information is not intended to replace advice given to you by your health care provider. Make sure you discuss any questions you have with your health care provider.   Document Released: 02/24/2004 Document Revised: 06/18/2014 Document Reviewed: 01/04/2014 Elsevier Interactive Patient Education [employer].

## 2015-12-15 NOTE — Progress Notes (Signed)
Cardiology Office Note   Date:  12/15/2015   ID:  JAVAEH MUSCATELLO, DOB [birth year], MRN [id number]  PCP:  Einar Pheasant, MD  Cardiologist:   Kathlyn Sacramento, MD   Chief Complaint  Patient presents with  . other    C/o palpitations. Meds reviewed verbally with pt.      History of Present Illness: Victoria Hanson is a 65 y.o. female who presents for a follow-up visit regarding atypical chest pain and palpitations. She was seen by Ignacia Bayley in January of this year for atypical chest pain. She has multiple chronic medical conditions that include hypertension, hyperlipidemia, family history of heart disease and obesity. She also has history of colon cancer with metastasis to the lungs and is status post partial colectomy, right-sided lobectomy and chemotherapy.  She was evaluated in January for atypical chest pain. She had an echocardiogram done which showed normal LV systolic function with mild biatrial enlargement and mild pulmonary hypertension. A pharmacologic nuclear stress test showed no evidence of ischemia.  She has been doing reasonably well since then with no recurrent episodes of chest pain. She has chronic exertional dyspnea with no recent worsening. Her biggest issue at the present time seems to be palpitations described as skipping in her heart associated with dizziness without syncope or presyncope. She went to the emergency room in May for these episodes. EKG and basic blood work were overall unremarkable.   Past Medical History  Diagnosis Date  . Colon cancer ([place])     a. 1999: Pt had partial colectomy. Did develop lung metastasis. Had right upper lobectomy in 01/2007 then had associated chemotherapy.  . Hypercholesterolemia   . Hypertension   . Depression   . Nephrolithiasis   . Elevated transaminase level     a. ? NASH;  b. 06/2015 - nl LFTs.  . GERD (gastroesophageal reflux disease)   . Holter monitor, abnormal     a. 12/2009 Holter monitoring: NSR, rare PACs and  PVCs.  . Echocardiogram abnormal     a. 02/2010 Echo: EF 55-60%, mild LAE, no regional wall motion abnormalities  . Lung cancer ([place])     a. S/P resection (lobectomy - right)  . COPD (chronic obstructive pulmonary disease) ([place])   . Anxiety   . DDD (degenerative disc disease), lumbosacral   . Hematuria   . Leaky heart valve     a. Per pt report - no evidence of valvular abnormalities on prior echoes.  . Atypical chest pain     a. 2003 Cath: reportedly nl per pt [name]);  b. 04/2015 [place] Admission, neg troponin.  . Morbid obesity ([place])   . Hyperlipidemia     Past Surgical History  Procedure Laterality Date  . Colon surgery      Colon cancer, removed part of large colon  . Lobectomy      Right lung, upper lobe right side removed  . Cholecystectomy  2000  . Tubal ligation  1980  . Total abdominal hysterectomy      abnormal bleeding  . Breast biopsy  10/07/2009    Right breast  . Colonoscopy N/A 04/25/2015    Procedure: COLONOSCOPY;  Surgeon: Manya Silvas, MD;  Location: [facility] ENDOSCOPY;  Service: Endoscopy;  Laterality: N/A;  . Cardiac catheterization  2003    negative as per pt report     Current Outpatient Prescriptions  Medication Sig Dispense Refill  . albuterol (PROVENTIL HFA;VENTOLIN HFA) 108 (90 Base) MCG/ACT inhaler Inhale 2 puffs into  the lungs every 6 (six) hours as needed for wheezing.    Marland Kitchen ALPRAZolam (XANAX) 0.5 MG tablet Take 1 tablet (0.5 mg total) by mouth daily as needed for anxiety. 60 tablet 0  . aspirin EC 81 MG tablet Take 81 mg by mouth daily.    . colestipol (COLESTID) 1 g tablet Take 1 g by mouth 4 (four) times daily.    . Multiple Vitamin (MULTIVITAMIN) tablet Take 1 tablet by mouth daily.    Marland Kitchen omeprazole (PRILOSEC) 20 MG capsule Take 20 mg by mouth daily.    . potassium chloride SA (K-DUR,KLOR-CON) 20 MEQ tablet TAKE 1 TABLET BY MOUTH TWICE A DAY. 60 tablet 3  . traZODone (DESYREL) 50 MG tablet Take 1.5 tablets (75 mg total) by mouth at bedtime.  45 tablet 2  . triamterene-hydrochlorothiazide (MAXZIDE-25) 37.5-25 MG tablet Take 1 tablet by mouth daily. (Patient taking differently: Take 0.5 tablets by mouth daily. ) 30 tablet 2   No current facility-administered medications for this visit.    Allergies:   Macrobid; Antihistamines, diphenhydramine-type; Aspirin; Ceftin; Celecoxib; Cymbalta; Diphenhydramine; Escitalopram; Lexapro; Metronidazole; Zoloft; and Ciprofloxacin    Social History:  The patient  reports that she has been passively smoking.  She has never used smokeless tobacco. She reports that she drinks about 0.6 oz of alcohol per week. She reports that she does not use illicit drugs.   Family History:  The patient's family history includes Cancer in her father; Coronary artery disease in her mother; Diabetes in her father and mother; Heart attack in her brother; Heart attack (age of onset: 38) in her father; Hypertension in her father; Mental illness in her brother and mother; Sudden death in her brother. There is no history of Breast cancer.    ROS:  Please see the history of present illness.   Otherwise, review of systems are positive for none.   All other systems are reviewed and negative.    PHYSICAL EXAM: VS:  BP 124/74 mmHg  Pulse 72  Ht '5\' 3"'$  (1.6 m)  Wt 191 lb 12 oz (86.977 kg)  BMI 33.98 kg/m2 , BMI Body mass index is 33.98 kg/(m^2). GEN: Well nourished, well developed, in no acute distress HEENT: normal Neck: no JVD, carotid bruits, or masses Cardiac: RRR; no murmurs, rubs, or gallops,no edema  Respiratory:  clear to auscultation bilaterally, normal work of breathing GI: soft, nontender, nondistended, + BS MS: no deformity or atrophy Skin: warm and dry, no rash Neuro:  Strength and sensation are intact Psych: euthymic mood, full affect   EKG:  EKG is ordered today. The ekg ordered today demonstrates normal sinus rhythm with nonspecific T wave changes.   Recent Labs: 05/04/2015: TSH 3.393 06/16/2015:  ALT 17 11/07/2015: BUN 25*; Creatinine, Ser 1.40*; Hemoglobin 11.4*; Magnesium 2.0; Platelets 210; Potassium 3.5; Sodium 136    Lipid Panel    Component Value Date/Time   CHOL 200 06/16/2015 0840   TRIG 143.0 06/16/2015 0840   HDL 50.30 06/16/2015 0840   CHOLHDL 4 06/16/2015 0840   VLDL 28.6 06/16/2015 0840   LDLCALC 121* 06/16/2015 0840   LDLDIRECT 163.4 12/31/2012 1022      Wt Readings from Last 3 Encounters:  12/15/15 191 lb 12 oz (86.977 kg)  12/05/15 192 lb 2 oz (87.147 kg)  11/06/15 191 lb (86.637 kg)       ASSESSMENT AND PLAN:  1.  Palpitations: Some of the history suggestive of premature beats but she complains of associated dizziness. Thus, I requested  a 48-hour Holter monitor.  2. Atypical chest pain: No recurrent episodes. Workup recently included a stress test and echocardiogram. Both of them were unremarkable.  3. Essential hypertension: Blood pressure is controlled on current medications.  4. Hyperlipidemia: Currently on colestipol with improvement in LDL 221.    Disposition:   FU with me as needed  Signed,  Kathlyn Sacramento, MD  12/15/2015 1:27 PM    [facility]
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
## 2015-12-20 IMAGING — CR DG CHEST 2V
1 series · 2 of 2 positions shown · non-contrast
Comparison: 10/26/2013, 12/13/2011, 09/29/2009.

CLINICAL DATA: Metastatic colon cancer. Prior right upper
lobectomy.

EXAM:
CHEST  2 VIEW

[Series 1: w chest pa · 0.14mm/px · 2 of 2 slices shown]
[im 1/2]
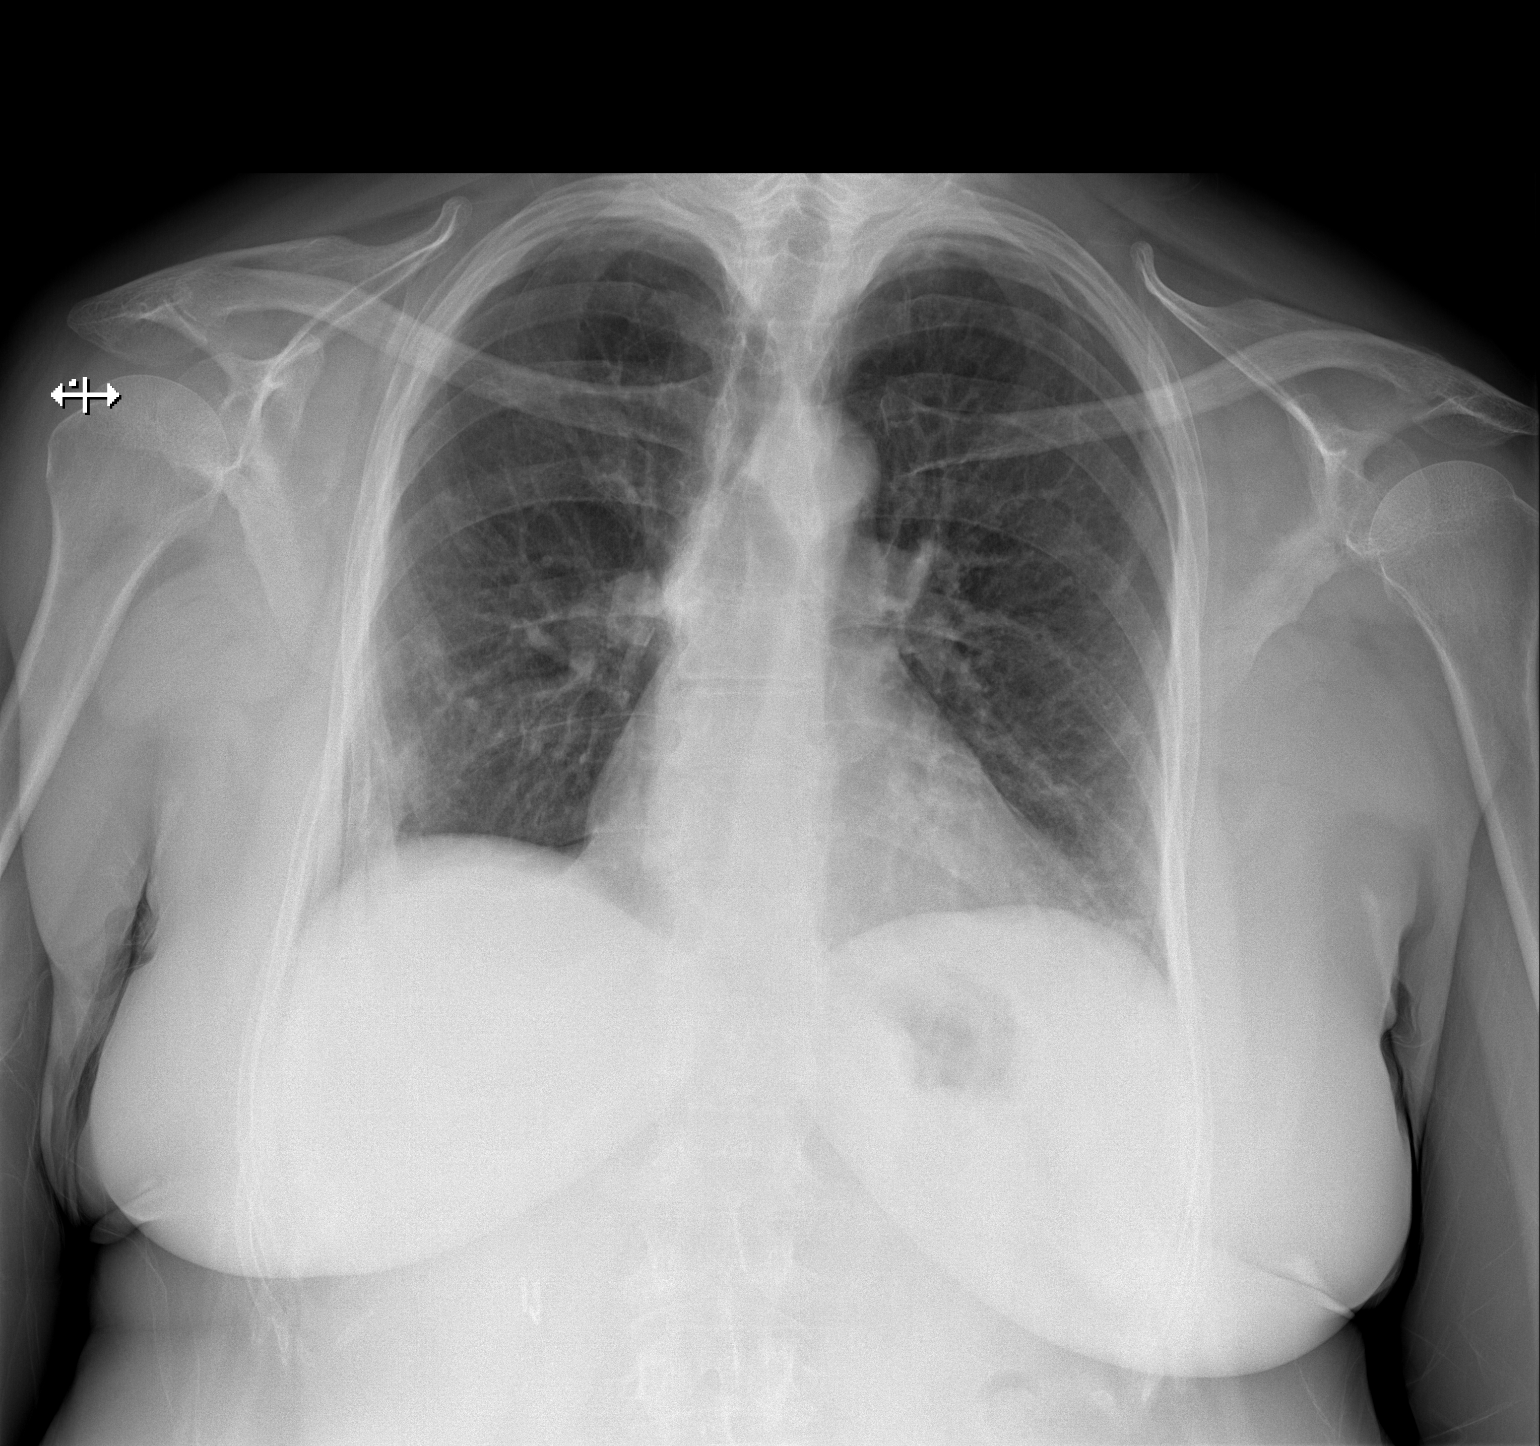
[im 2/2]
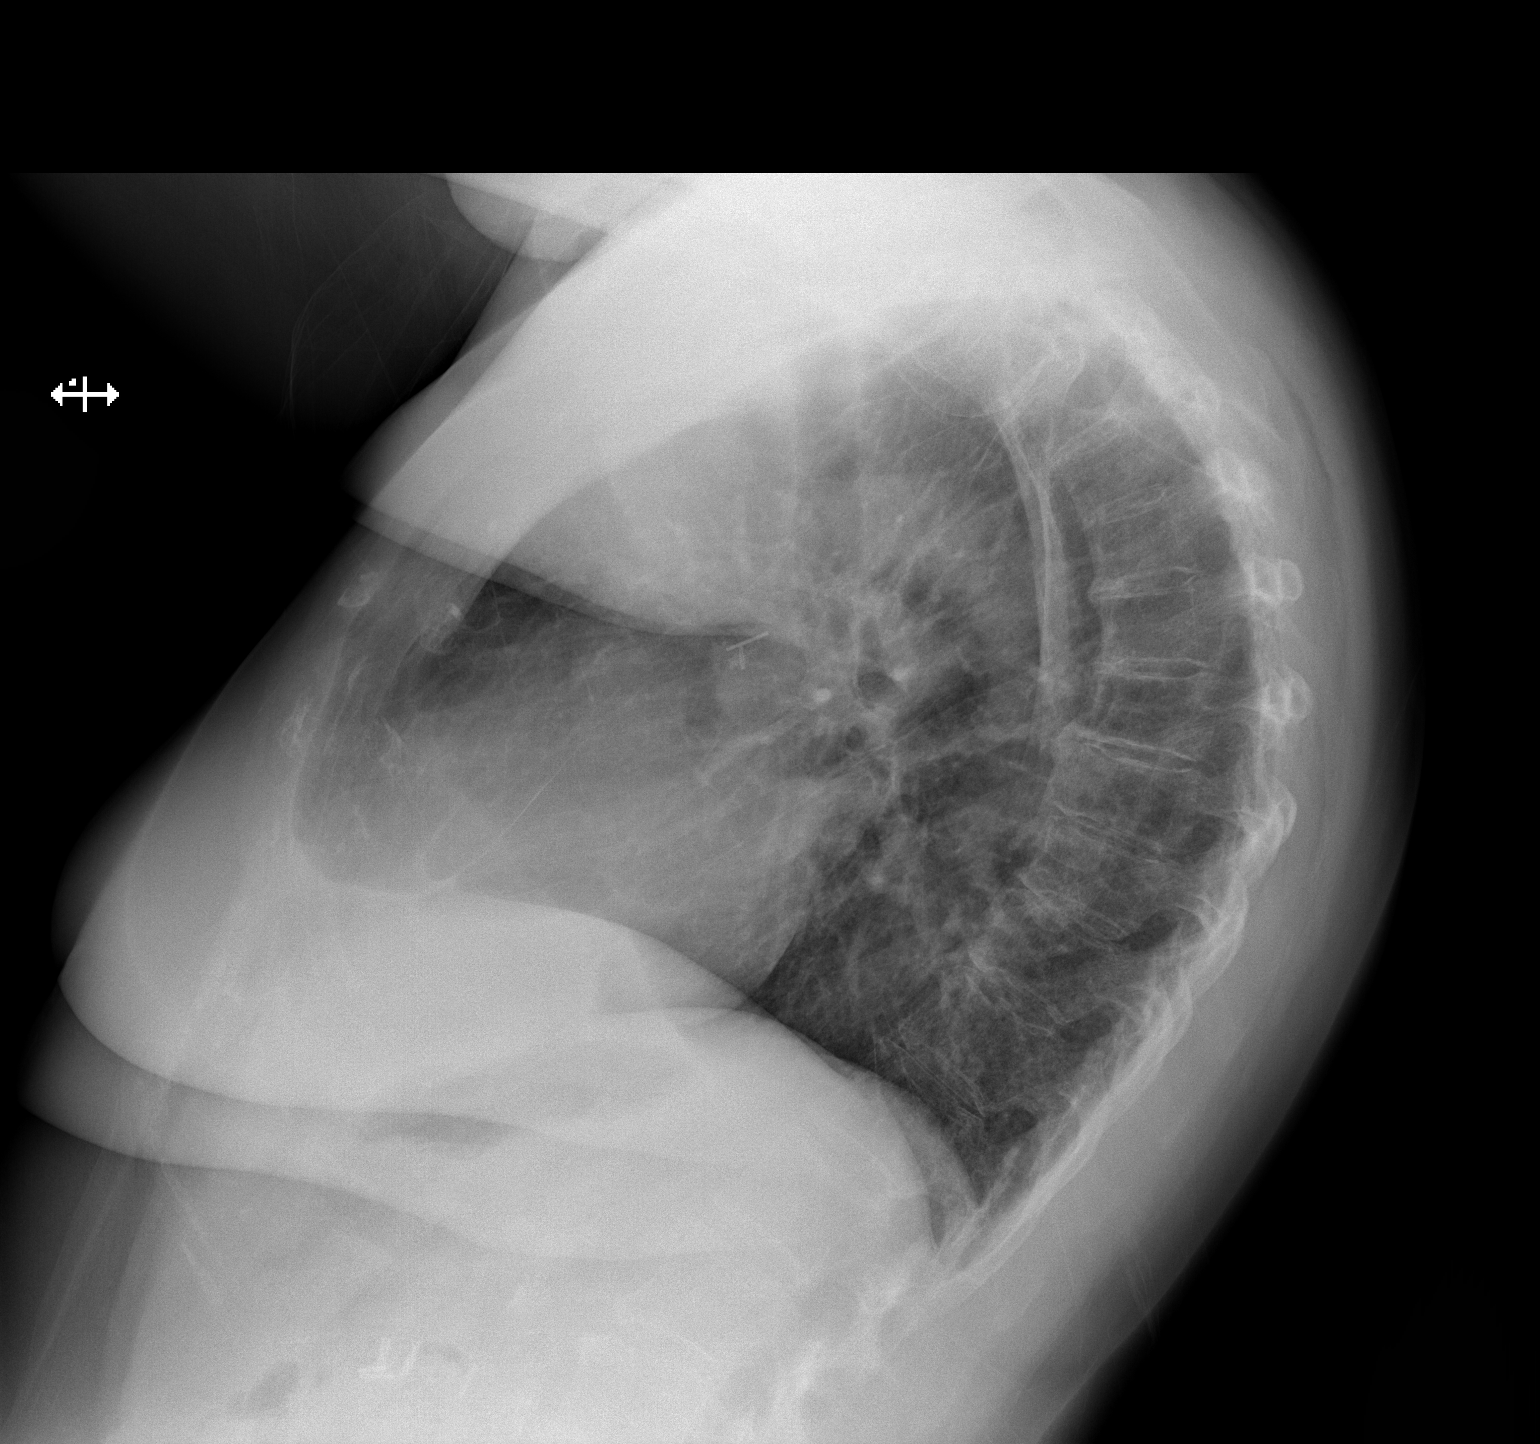

[2 of 2 positions shown; findings below may reference images not displayed]

FINDINGS: Cardiomediastinal silhouette unremarkable, unchanged. Prior right
upper lobe resection. Stable pleuroparenchymal scarring at the right
base. Lungs otherwise clear. No localized airspace consolidation. No
pleural effusions. No pneumothorax. Normal pulmonary vascularity. No
visible pulmonary parenchymal nodules or masses. Degenerative
changes throughout the thoracic spine. No significant interval
change.
IMPRESSION: Stable pleuroparenchymal scarring at the right lung base related to
prior right upper lobe resection. No acute cardiopulmonary disease.
No evidence of metastatic disease. Stable examination.

## 2015-12-27 ENCOUNTER — Ambulatory Visit (INDEPENDENT_AMBULATORY_CARE_PROVIDER_SITE_OTHER): Payer: Medicare PPO

## 2015-12-27 DIAGNOSIS — R002 Palpitations: Secondary | ICD-10-CM

## 2015-12-28 ENCOUNTER — Emergency Department: Payer: Medicare PPO

## 2015-12-28 ENCOUNTER — Telehealth: Payer: Self-pay | Admitting: Cardiovascular Disease

## 2015-12-28 ENCOUNTER — Encounter: Payer: Self-pay | Admitting: Emergency Medicine

## 2015-12-28 ENCOUNTER — Emergency Department
Admission: EM | Admit: 2015-12-28 | Discharge: 2015-12-29 | Disposition: A | Discharge status: Home / Self Care | Payer: Medicare PPO | Attending: Student | Admitting: Student

## 2015-12-28 DIAGNOSIS — N390 Urinary tract infection, site not specified: Secondary | ICD-10-CM | POA: Diagnosis not present

## 2015-12-28 DIAGNOSIS — Z85038 Personal history of other malignant neoplasm of large intestine: Secondary | ICD-10-CM | POA: Diagnosis not present

## 2015-12-28 DIAGNOSIS — Y999 Unspecified external cause status: Secondary | ICD-10-CM | POA: Diagnosis not present

## 2015-12-28 DIAGNOSIS — Z85118 Personal history of other malignant neoplasm of bronchus and lung: Secondary | ICD-10-CM | POA: Diagnosis not present

## 2015-12-28 DIAGNOSIS — Z7982 Long term (current) use of aspirin: Secondary | ICD-10-CM | POA: Diagnosis not present

## 2015-12-28 DIAGNOSIS — Z79899 Other long term (current) drug therapy: Secondary | ICD-10-CM | POA: Insufficient documentation

## 2015-12-28 DIAGNOSIS — Y929 Unspecified place or not applicable: Secondary | ICD-10-CM | POA: Insufficient documentation

## 2015-12-28 DIAGNOSIS — Y93H2 Activity, gardening and landscaping: Secondary | ICD-10-CM | POA: Insufficient documentation

## 2015-12-28 DIAGNOSIS — X58XXXA Exposure to other specified factors, initial encounter: Secondary | ICD-10-CM | POA: Diagnosis not present

## 2015-12-28 DIAGNOSIS — Z7722 Contact with and (suspected) exposure to environmental tobacco smoke (acute) (chronic): Secondary | ICD-10-CM | POA: Insufficient documentation

## 2015-12-28 DIAGNOSIS — I10 Essential (primary) hypertension: Secondary | ICD-10-CM | POA: Diagnosis not present

## 2015-12-28 DIAGNOSIS — J449 Chronic obstructive pulmonary disease, unspecified: Secondary | ICD-10-CM | POA: Insufficient documentation

## 2015-12-28 DIAGNOSIS — M545 Low back pain: Secondary | ICD-10-CM | POA: Diagnosis present

## 2015-12-28 DIAGNOSIS — S39012A Strain of muscle, fascia and tendon of lower back, initial encounter: Secondary | ICD-10-CM | POA: Diagnosis not present

## 2015-12-28 MED ORDER — KETOROLAC TROMETHAMINE 60 MG/2ML IM SOLN
30.0000 mg | Freq: Once | INTRAMUSCULAR | Status: AC
  Administered 2015-12-28: 30 mg via INTRAMUSCULAR
  Filled 2015-12-28: qty 2

## 2015-12-28 NOTE — ED Notes (Signed)
Pt c/o lower back pain radiating into left leg that started after mowing lawn a few days ago, pt is able to ambulate. Pt hx of osteoporosis.

## 2015-12-28 NOTE — ED Provider Notes (Signed)
[facility] Emergency Department Provider Note ____________________________________________  Time seen: 2235  I have reviewed the triage vital signs and the nursing notes.  HISTORY  Chief Complaint  Back Pain  HPI Victoria Hanson is a 65 y.o. female Since to the ED for evaluation of low back pain with onset his prior. The patient, with a history of osteoporosis, reports pain to the sacral region of her low back with some referral pain into the left groin and anterior thigh. She denies any injury, trauma, or fall. She assumed she may have injured her back due to being on her riding lawnmower. She describes a rough, bumpy ride the day of onset. Any interim bladder or bowel incontinence,leg weakness, or distal paresthesias. She does report intermittent low back pain, but notes the pain usually resolves with over-the-counter ibuprofen. Her low back pain is increased with a long sitting. She rates her pain at a 10/10 in triage, describing the pain as achy in nature.  Past Medical History  Diagnosis Date  . Colon cancer ([place])     a. 1999: Pt had partial colectomy. Did develop lung metastasis. Had right upper lobectomy in 01/2007 then had associated chemotherapy.  . Hypercholesterolemia   . Hypertension   . Depression   . Nephrolithiasis   . Elevated transaminase level     a. ? NASH;  b. 06/2015 - nl LFTs.  . GERD (gastroesophageal reflux disease)   . Holter monitor, abnormal     a. 12/2009 Holter monitoring: NSR, rare PACs and PVCs.  . Echocardiogram abnormal     a. 02/2010 Echo: EF 55-60%, mild LAE, no regional wall motion abnormalities  . Lung cancer ([place])     a. S/P resection (lobectomy - right)  . COPD (chronic obstructive pulmonary disease) ([place])   . Anxiety   . DDD (degenerative disc disease), lumbosacral   . Hematuria   . Leaky heart valve     a. Per pt report - no evidence of valvular abnormalities on prior echoes.  . Atypical chest pain     a. 2003 Cath:  reportedly nl per pt [name]);  b. 04/2015 [place] Admission, neg troponin.  . Morbid obesity ([place])   . Hyperlipidemia     Patient Active Problem List   Diagnosis Date Noted  . Sinus pressure 08/03/2015  . Foot pain, bilateral 07/24/2015  . Hyperlipidemia 06/21/2015  . Atypical chest pain 06/21/2015  . Other fatigue 06/21/2015  . Morbid obesity due to excess calories ([place]) 06/21/2015  . Morbid obesity ([place])   . Abnormal mammogram 06/19/2015  . Urinary system disease 03/28/2015  . Hyperglycemia 03/20/2015  . Right lower quadrant abdominal pain 02/03/2015  . Swelling of lower extremity 11/20/2014  . H/O malignant neoplasm of colon 11/19/2014  . Dysuria 10/24/2014  . Flatulence, eructation and gas pain 10/24/2014  . Health care maintenance 07/17/2014  . Hypokalemia 04/05/2013  . TIA (transient ischemic attack) 02/09/2013  . Temporary cerebral vascular dysfunction 02/09/2013  . Chest pain at rest 05/07/2012  . Anxiety, mild 03/13/2012  . Depression, major, recurrent ([place]) 03/13/2012  . Lung cancer ([place]) 03/13/2012  . Colon cancer ([place]) 03/13/2012  . Hypertension 03/13/2012  . CKD (chronic kidney disease), stage III 03/13/2012  . GERD (gastroesophageal reflux disease) 03/13/2012  . Acid reflux 03/13/2012  . Essential (primary) hypertension 03/13/2012  . Cancer of lung ([place]) 03/13/2012  . Major depressive disorder with single episode ([place]) 03/13/2012  . Malignant neoplasm of colon ([place]) 03/13/2012  . Hypercholesterolemia 02/02/2010  .  Pure hypercholesterolemia 02/02/2010    Past Surgical History  Procedure Laterality Date  . Colon surgery      Colon cancer, removed part of large colon  . Lobectomy      Right lung, upper lobe right side removed  . Cholecystectomy  2000  . Tubal ligation  1980  . Total abdominal hysterectomy      abnormal bleeding  . Breast biopsy  10/07/2009    Right breast  . Colonoscopy N/A 04/25/2015    Procedure: COLONOSCOPY;  Surgeon: Manya Silvas, MD;  Location: [facility] ENDOSCOPY;  Service: Endoscopy;  Laterality: N/A;  . Cardiac catheterization  2003    negative as per pt report    Current Outpatient Rx  Name  Route  Sig  Dispense  Refill  . albuterol (PROVENTIL HFA;VENTOLIN HFA) 108 (90 Base) MCG/ACT inhaler   Inhalation   Inhale 2 puffs into the lungs every 6 (six) hours as needed for wheezing.         Marland Kitchen ALPRAZolam (XANAX) 0.5 MG tablet   Oral   Take 1 tablet (0.5 mg total) by mouth daily as needed for anxiety.   60 tablet   0   . amoxicillin-clavulanate (AUGMENTIN) 200-28.5 MG chewable tablet   Oral   Chew 1 tablet by mouth 2 (two) times daily.   19 tablet   0   . aspirin EC 81 MG tablet   Oral   Take 81 mg by mouth daily.         . colestipol (COLESTID) 1 g tablet   Oral   Take 1 g by mouth 4 (four) times daily.         . cyclobenzaprine (FLEXERIL) 5 MG tablet   Oral   Take 1 tablet (5 mg total) by mouth 3 (three) times daily as needed for muscle spasms.   15 tablet   0   . Multiple Vitamin (MULTIVITAMIN) tablet   Oral   Take 1 tablet by mouth daily.         Marland Kitchen omeprazole (PRILOSEC) 20 MG capsule   Oral   Take 20 mg by mouth daily.         . potassium chloride SA (K-DUR,KLOR-CON) 20 MEQ tablet      TAKE 1 TABLET BY MOUTH TWICE A DAY.   60 tablet   3   . traZODone (DESYREL) 50 MG tablet   Oral   Take 1.5 tablets (75 mg total) by mouth at bedtime.   45 tablet   2   . triamterene-hydrochlorothiazide (MAXZIDE-25) 37.5-25 MG tablet   Oral   Take 1 tablet by mouth daily. Patient taking differently: Take 0.5 tablets by mouth daily.    30 tablet   2     Allergies Macrobid; Antihistamines, diphenhydramine-type; Aspirin; Ceftin; Celecoxib; Cymbalta; Diphenhydramine; Escitalopram; Lexapro; Metronidazole; Zoloft; and Ciprofloxacin  Family History  Problem Relation Age of Onset  . Coronary artery disease Mother     died @ 66 of MI.  . Mental illness Mother   . Diabetes Mother    . Heart attack Father 20    MI  . Cancer Father     lung - died in his mid-70's  . Hypertension Father   . Diabetes Father   . Sudden death Brother   . Mental illness Brother   . Heart attack Brother     MI @ 83, died @ age 86.  . Breast cancer Neg Hx     Social History Social History  Substance Use Topics  . Smoking status: Passive Smoke Exposure - Never Smoker  . Smokeless tobacco: Never Used     Comment: husband and sons smoked in her home.  . Alcohol Use: 0.6 oz/week    1 Glasses of wine, 0 Standard drinks or equivalent per week     Comment: rare wine cooler.   Review of Systems  Constitutional: Negative for fever. Cardiovascular: Negative for chest pain. Respiratory: Negative for shortness of breath. Gastrointestinal: Negative for abdominal pain, vomiting and diarrhea. Genitourinary: Negative for dysuria. Musculoskeletal: positive for back pain. Neurological: Negative for headaches, focal weakness or numbness. ____________________________________________  PHYSICAL EXAM:  VITAL SIGNS: ED Triage Vitals  Enc Vitals Group     BP 12/28/15 2151 159/73 mmHg     Pulse Rate 12/28/15 2151 63     Resp 12/28/15 2151 20     Temp 12/28/15 2151 98.2 F (36.8 C)     Temp Source 12/28/15 2151 Oral     SpO2 12/28/15 2151 98 %     Weight 12/28/15 2151 191 lb (86.637 kg)     Height 12/28/15 2151 '5\' 3"'$  (1.6 m)     Head Cir --      Peak Flow --      Pain Score 12/28/15 2151 10     Pain Loc --      Pain Edu? --      Excl. in Accident? --    Constitutional: Alert and oriented. Well appearing and in no distress. Head: Normocephalic and atraumatic. Cardiovascular: Normal rate, regular rhythm.  Respiratory: Normal respiratory effort. No wheezes/rales/rhonchi. Gastrointestinal: Soft and nontender. No distention. Musculoskeletal: With normal spinal alignment without midline tenderness, spasm, deformity, or step-off. She does have some mild tenderness to palpation over the sacrum in  the midline. Normal sit-to-stand transition. Negative trendelenburg. Negative supine SLR bilaterally. Nontender with normal range of motion in all extremities.  Neurologic:  Normal gait without ataxia. Normal speech and language. No gross focal neurologic deficits are appreciated. Skin:  Skin is warm, dry and intact. No rash noted. ____________________________________________   LABS (pertinent positives/negatives) Labs Reviewed  URINALYSIS COMPLETEWITH MICROSCOPIC ([place] ONLY) - Abnormal; Notable for the following:    Bacteria, UA MANY (*)    Squamous Epithelial / LPF 0-5 (*)    All other components within normal limits  URINE CULTURE  ____________________________________________   RADIOLOGY Lumbar spine  IMPRESSION: No acute finding. No significant change compared to 2015.  I, Lekesha Claw, Dannielle Karvonen, personally viewed and evaluated these images (plain radiographs) as part of my medical decision making, as well as reviewing the written report by the radiologist. ___________________________________________  PROCEDURES  Toradol 30 mg IM Flexeril 5 mg PO Augmentin 875 mg PO ____________________________________________  INITIAL IMPRESSION / ASSESSMENT AND PLAN / ED COURSE  Patient with LBP and asymptomatic bacteruria. Treatment initiated with Augmentin and a prescription for Flexeril 5 mg provided. She will follow-up with her provider for recheck of urinalysis and continued symptoms. ____________________________________________  FINAL CLINICAL IMPRESSION(S) / ED DIAGNOSES  Final diagnoses:  Lumbar strain, initial encounter  UTI (lower urinary tract infection)     Melvenia Needles, PA-C 12/29/15 0056   Joanne Gavel, MD 01/06/16 1606

## 2015-12-28 NOTE — Telephone Encounter (Signed)
S/w pt who asks if she should return holter monitor today. Monitor placed on July 18 to be worn 48 hours. She understands to continue wearing monitor until July 20 at which time she will return to our office. Pt had no further questions at this time.

## 2015-12-28 NOTE — Telephone Encounter (Signed)
Patient says she is on 48 hr heart monitor and has only had one episode of heart palpitations.  Patient wants to know if she can go ahead and return monitor today  instead of in the morning.  Please call to discuss.

## 2015-12-28 NOTE — ED Notes (Signed)
Pt arrived to the ED accompanied by her daughter for complaints of lower back pain secondary to "injuring/pulling a muscle while cutting the grass." Pt is AOx4 in no apparent distres.

## 2015-12-29 LAB — URINALYSIS COMPLETE WITH MICROSCOPIC (ARMC ONLY)
BILIRUBIN URINE: NEGATIVE
GLUCOSE, UA: NEGATIVE mg/dL
Ketones, ur: NEGATIVE mg/dL
NITRITE: POSITIVE — AB
Protein, ur: NEGATIVE mg/dL
SPECIFIC GRAVITY, URINE: 1.011 (ref 1.005–1.030)
pH: 6 (ref 5.0–8.0)

## 2015-12-29 MED ORDER — AMOXICILLIN-POT CLAVULANATE 875-125 MG PO TABS
1.0000 | ORAL_TABLET | Freq: Once | ORAL | Status: AC
  Administered 2015-12-29: 1 via ORAL
  Filled 2015-12-29: qty 1

## 2015-12-29 MED ORDER — AMOXICILLIN-POT CLAVULANATE 200-28.5 MG PO CHEW: 1.0000 | CHEWABLE_TABLET | Freq: Two times a day (BID) | ORAL | Status: DC

## 2015-12-29 MED ORDER — CYCLOBENZAPRINE HCL 10 MG PO TABS
5.0000 mg | ORAL_TABLET | Freq: Once | ORAL | Status: AC
  Administered 2015-12-29: 5 mg via ORAL
  Filled 2015-12-29: qty 1

## 2015-12-29 MED ORDER — CYCLOBENZAPRINE HCL 5 MG PO TABS: 5.0000 mg | ORAL_TABLET | Freq: Three times a day (TID) | ORAL | Status: DC | PRN

## 2015-12-29 MED ORDER — AMOXICILLIN-POT CLAVULANATE 875-125 MG PO TABS: 1.0000 | ORAL_TABLET | Freq: Two times a day (BID) | ORAL | Status: DC

## 2015-12-29 NOTE — Telephone Encounter (Signed)
Patient dropped off monitor at the office this morning.  Patient says she had to take it off as she went to ER last night at 9 for back pain.  Patient says she has had several episodes of palpitations.

## 2015-12-29 NOTE — Discharge Instructions (Signed)
Take the muscle relaxant only as needed along with OTC ibuprofen for pain and inflammation. Follow-up with your provider for continued symptoms. Also take the antibiotic as directed.   Lumbosacral Strain Lumbosacral strain is a strain of any of the parts that make up your lumbosacral vertebrae. Your lumbosacral vertebrae are the bones that make up the lower third of your backbone. Your lumbosacral vertebrae are held together by muscles and tough, fibrous tissue (ligaments).  CAUSES  A sudden blow to your back can cause lumbosacral strain. Also, anything that causes an excessive stretch of the muscles in the low back can cause this strain. This is typically seen when people exert themselves strenuously, fall, lift heavy objects, bend, or crouch repeatedly. RISK FACTORS  Physically demanding work.  Participation in pushing or pulling sports or sports that require a sudden twist of the back (tennis, golf, baseball).  Weight lifting.  Excessive lower back curvature.  Forward-tilted pelvis.  Weak back or abdominal muscles or both.  Tight hamstrings. SIGNS AND SYMPTOMS  Lumbosacral strain may cause pain in the area of your injury or pain that moves (radiates) down your leg.  DIAGNOSIS Your health care provider can often diagnose lumbosacral strain through a physical exam. In some cases, you may need tests such as X-ray exams.  TREATMENT  Treatment for your lower back injury depends on many factors that your clinician will have to evaluate. However, most treatment will include the use of anti-inflammatory medicines. HOME CARE INSTRUCTIONS   Avoid hard physical activities (tennis, racquetball, waterskiing) if you are not in proper physical condition for it. This may aggravate or create problems.  If you have a back problem, avoid sports requiring sudden body movements. Swimming and walking are generally safer activities.  Maintain good posture.  Maintain a healthy weight.  For acute  conditions, you may put ice on the injured area.  Put ice in a plastic bag.  Place a towel between your skin and the bag.  Leave the ice on for 20 minutes, 2-3 times a day.  When the low back starts healing, stretching and strengthening exercises may be recommended. SEEK MEDICAL CARE IF:  Your back pain is getting worse.  You experience severe back pain not relieved with medicines. SEEK IMMEDIATE MEDICAL CARE IF:   You have numbness, tingling, weakness, or problems with the use of your arms or legs.  There is a change in bowel or bladder control.  You have increasing pain in any area of the body, including your belly (abdomen).  You notice shortness of breath, dizziness, or feel faint.  You feel sick to your stomach (nauseous), are throwing up (vomiting), or become sweaty.  You notice discoloration of your toes or legs, or your feet get very cold. MAKE SURE YOU:   Understand these instructions.  Will watch your condition.  Will get help right away if you are not doing well or get worse.   This information is not intended to replace advice given to you by your health care provider. Make sure you discuss any questions you have with your health care provider.   Document Released: 03/07/2005 Document Revised: 06/18/2014 Document Reviewed: 01/14/2013 Elsevier Interactive Patient Education 2016 Else[employer] Inc[employer]  Urinary Tract Infection A urinary tract infection (UTI) can occur any place along the urinary tract. The tract includes the kidneys, ureters, bladder, and urethra. A type of germ called bacteria often causes a UTI. UTIs are often helped with antibiotic medicine.  HOME CARE   If given, take  antibiotics as told by your doctor. Finish them even if you start to feel better.  Drink enough fluids to keep your pee (urine) clear or pale yellow.  Avoid tea, drinks with caffeine, and bubbly (carbonated) drinks.  Pee often. Avoid holding your pee in for a long time.  Pee  before and after having sex (intercourse).  Wipe from front to back after you poop (bowel movement) if you are a woman. Use each tissue only once. GET HELP RIGHT AWAY IF:   You have back pain.  You have lower belly (abdominal) pain.  You have chills.  You feel sick to your stomach (nauseous).  You throw up (vomit).  Your burning or discomfort with peeing does not go away.  You have a fever.  Your symptoms are not better in 3 days. MAKE SURE YOU:   Understand these instructions.  Will watch your condition.  Will get help right away if you are not doing well or get worse.   This information is not intended to replace advice given to you by your health care provider. Make sure you discuss any questions you have with your health care provider.   Document Released: 11/14/2007 Document Revised: 06/18/2014 Document Reviewed: 12/27/2011 Elsevier Interactive Patient Education [employer].

## 2015-12-30 ENCOUNTER — Ambulatory Visit
Admission: RE | Admit: 2015-12-30 | Discharge: 2015-12-30 | Disposition: A | Discharge status: Home / Self Care | Payer: Medicare PPO | Source: Ambulatory Visit | Attending: Cardiovascular Disease | Admitting: Cardiovascular Disease

## 2015-12-30 DIAGNOSIS — Z029 Encounter for administrative examinations, unspecified: Secondary | ICD-10-CM | POA: Insufficient documentation

## 2015-12-31 LAB — URINE CULTURE
Culture: >=100000 — AB
Special Requests: NORMAL

## 2016-01-02 ENCOUNTER — Encounter: Payer: Self-pay | Admitting: *Deleted

## 2016-01-28 ENCOUNTER — Other Ambulatory Visit: Payer: Self-pay | Admitting: Internal Medicine

## 2016-01-30 ENCOUNTER — Other Ambulatory Visit (INDEPENDENT_AMBULATORY_CARE_PROVIDER_SITE_OTHER): Payer: Commercial Managed Care - HMO

## 2016-01-30 ENCOUNTER — Encounter (INDEPENDENT_AMBULATORY_CARE_PROVIDER_SITE_OTHER): Payer: Self-pay

## 2016-01-30 DIAGNOSIS — I10 Essential (primary) hypertension: Secondary | ICD-10-CM

## 2016-01-30 DIAGNOSIS — R739 Hyperglycemia, unspecified: Secondary | ICD-10-CM | POA: Diagnosis not present

## 2016-01-30 DIAGNOSIS — D649 Anemia, unspecified: Secondary | ICD-10-CM | POA: Diagnosis not present

## 2016-01-30 DIAGNOSIS — E7800 Pure hypercholesterolemia, unspecified: Secondary | ICD-10-CM

## 2016-01-30 LAB — CBC WITH DIFFERENTIAL/PLATELET
BASOS ABS: 0.1 10*3/uL (ref 0.0–0.1)
Basophils Relative: 1.1 % (ref 0.0–3.0)
EOS ABS: 0.1 10*3/uL (ref 0.0–0.7)
Eosinophils Relative: 1.6 % (ref 0.0–5.0)
HCT: 39.2 % (ref 36.0–46.0)
Hemoglobin: 13.3 g/dL (ref 12.0–15.0)
LYMPHS ABS: 1.7 10*3/uL (ref 0.7–4.0)
Lymphocytes Relative: 22.4 % (ref 12.0–46.0)
MCHC: 34 g/dL (ref 30.0–36.0)
MCV: 85.4 fl (ref 78.0–100.0)
MONOS PCT: 4.7 % (ref 3.0–12.0)
Monocytes Absolute: 0.4 10*3/uL (ref 0.1–1.0)
NEUTROS ABS: 5.5 10*3/uL (ref 1.4–7.7)
NEUTROS PCT: 70.2 % (ref 43.0–77.0)
PLATELETS: 295 10*3/uL (ref 150.0–400.0)
RBC: 4.59 Mil/uL (ref 3.87–5.11)
RDW: 13 % (ref 11.5–15.5)
WBC: 7.8 10*3/uL (ref 4.0–10.5)

## 2016-01-30 LAB — HEPATIC FUNCTION PANEL
ALK PHOS: 94 U/L (ref 39–117)
ALT: 12 U/L (ref 0–35)
AST: 22 U/L (ref 0–37)
Albumin: 4 g/dL (ref 3.5–5.2)
Bilirubin, Direct: 0.1 mg/dL (ref 0.0–0.3)
TOTAL PROTEIN: 7.3 g/dL (ref 6.0–8.3)
Total Bilirubin: 0.5 mg/dL (ref 0.2–1.2)

## 2016-01-30 LAB — BASIC METABOLIC PANEL
BUN: 17 mg/dL (ref 6–23)
CALCIUM: 9.5 mg/dL (ref 8.4–10.5)
CO2: 26 mEq/L (ref 19–32)
Chloride: 103 mEq/L (ref 96–112)
Creatinine, Ser: 1.25 mg/dL — ABNORMAL HIGH (ref 0.40–1.20)
GFR: 45.55 mL/min — AB (ref >60.00)
GLUCOSE: 106 mg/dL — AB (ref 70–99)
Potassium: 4.4 mEq/L (ref 3.5–5.1)
Sodium: 137 mEq/L (ref 135–145)

## 2016-01-30 LAB — LIPID PANEL
Cholesterol: 225 mg/dL — ABNORMAL HIGH (ref 0–200)
HDL: 59 mg/dL (ref >39.00)
LDL Cholesterol: 142 mg/dL — ABNORMAL HIGH (ref 0–99)
NONHDL: 165.52
Total CHOL/HDL Ratio: 4
Triglycerides: 118 mg/dL (ref 0.0–149.0)
VLDL: 23.6 mg/dL (ref 0.0–40.0)

## 2016-01-30 LAB — FERRITIN: Ferritin: 41.5 ng/mL (ref 10.0–291.0)

## 2016-01-30 LAB — HEMOGLOBIN A1C: Hgb A1c MFr Bld: 6 % (ref 4.6–6.5)

## 2016-02-02 ENCOUNTER — Ambulatory Visit: Payer: Medicare PPO | Admitting: Internal Medicine

## 2016-02-03 ENCOUNTER — Telehealth: Payer: Self-pay | Admitting: Internal Medicine

## 2016-02-03 ENCOUNTER — Ambulatory Visit (INDEPENDENT_AMBULATORY_CARE_PROVIDER_SITE_OTHER): Payer: Commercial Managed Care - HMO | Admitting: Internal Medicine

## 2016-02-03 ENCOUNTER — Encounter: Payer: Self-pay | Admitting: Internal Medicine

## 2016-02-03 DIAGNOSIS — R739 Hyperglycemia, unspecified: Secondary | ICD-10-CM

## 2016-02-03 DIAGNOSIS — G459 Transient cerebral ischemic attack, unspecified: Secondary | ICD-10-CM | POA: Diagnosis not present

## 2016-02-03 DIAGNOSIS — Z23 Encounter for immunization: Secondary | ICD-10-CM

## 2016-02-03 DIAGNOSIS — C189 Malignant neoplasm of colon, unspecified: Secondary | ICD-10-CM | POA: Diagnosis not present

## 2016-02-03 DIAGNOSIS — F329 Major depressive disorder, single episode, unspecified: Secondary | ICD-10-CM | POA: Diagnosis not present

## 2016-02-03 DIAGNOSIS — E7800 Pure hypercholesterolemia, unspecified: Secondary | ICD-10-CM

## 2016-02-03 DIAGNOSIS — C3490 Malignant neoplasm of unspecified part of unspecified bronchus or lung: Secondary | ICD-10-CM

## 2016-02-03 DIAGNOSIS — I10 Essential (primary) hypertension: Secondary | ICD-10-CM

## 2016-02-03 MED ORDER — TRIAMTERENE-HCTZ 37.5-25 MG PO TABS: 0.5000 | ORAL_TABLET | Freq: Every day | ORAL | 1 refills | Status: DC

## 2016-02-03 MED ORDER — ALBUTEROL SULFATE HFA 108 (90 BASE) MCG/ACT IN AERS: 2.0000 | INHALATION_SPRAY | Freq: Four times a day (QID) | RESPIRATORY_TRACT | 2 refills | Status: DC | PRN

## 2016-02-03 MED ORDER — COLESTIPOL HCL 1 G PO TABS
1.0000 g | ORAL_TABLET | Freq: Two times a day (BID) | ORAL | 1 refills | Status: DC
Start: 2016-02-03 — End: 2016-03-26

## 2016-02-03 MED ORDER — POTASSIUM CHLORIDE CRYS ER 20 MEQ PO TBCR: 20.0000 meq | EXTENDED_RELEASE_TABLET | Freq: Two times a day (BID) | ORAL | 2 refills | Status: DC

## 2016-02-03 MED ORDER — TRAZODONE HCL 50 MG PO TABS: 75.0000 mg | ORAL_TABLET | Freq: Every day | ORAL | 2 refills | Status: DC

## 2016-02-03 NOTE — Telephone Encounter (Signed)
Please advise 

## 2016-02-03 NOTE — Progress Notes (Signed)
Patient ID: Victoria Hanson, female   DOB: March 21, 1951, 65 y.o.   MRN: [id number]   Subjective:    Patient ID: Victoria Hanson, female    DOB: [birth year], 65 y.o.   MRN: [id number]  HPI  Patient here for a scheduled follow up.  She saw Dr Fletcher Anon.  Had holter.  No significant arrhythmias.  Also had unremarkable echo and stress echo.  No chest pain.  Her back pain has resolved.  Was seen in ER recently for back pain.  Treated for uti.  Back pain resolved.  Eating and drinking well.  Increased stress.  Taking xanax prn.  Not requiring often.  Blood pressure fluctuates - per her report.  Looks good today.  No acid reflux.  No abdominal pain or cramping.  Bowels stable.     Past Medical History:  Diagnosis Date  . Anxiety   . Atypical chest pain    a. 2003 Cath: reportedly nl per pt [name]);  b. 04/2015 [facility] Admission, neg troponin.  . Colon cancer ([place])    a. 1999: Pt had partial colectomy. Did develop lung metastasis. Had right upper lobectomy in 01/2007 then had associated chemotherapy.  Marland Kitchen COPD (chronic obstructive pulmonary disease) ([place])   . DDD (degenerative disc disease), lumbosacral   . Depression   . Echocardiogram abnormal    a. 02/2010 Echo: EF 55-60%, mild LAE, no regional wall motion abnormalities  . Elevated transaminase level    a. ? NASH;  b. 06/2015 - nl LFTs.  . GERD (gastroesophageal reflux disease)   . Hematuria   . Holter monitor, abnormal    a. 12/2009 Holter monitoring: NSR, rare PACs and PVCs.  Marland Kitchen Hypercholesterolemia   . Hyperlipidemia   . Hypertension   . Leaky heart valve    a. Per pt report - no evidence of valvular abnormalities on prior echoes.  . Lung cancer ([place])    a. S/P resection (lobectomy - right)  . Morbid obesity ([place])   . Nephrolithiasis    Past Surgical History:  Procedure Laterality Date  . BREAST BIOPSY  10/07/2009   Right breast  . CARDIAC CATHETERIZATION  2003   negative as per pt report  . CHOLECYSTECTOMY  2000  . COLON SURGERY     Colon cancer, removed part of large colon  . COLONOSCOPY N/A 04/25/2015   Procedure: COLONOSCOPY;  Surgeon: Manya Silvas, MD;  Location: [facility] ENDOSCOPY;  Service: Endoscopy;  Laterality: N/A;  . LOBECTOMY     Right lung, upper lobe right side removed  . TOTAL ABDOMINAL HYSTERECTOMY     abnormal bleeding  . TUBAL LIGATION  1980   Family History  Problem Relation Age of Onset  . Coronary artery disease Mother     died @ 49 of MI.  . Mental illness Mother   . Diabetes Mother   . Heart attack Father 34    MI  . Cancer Father     lung - died in his mid-70's  . Hypertension Father   . Diabetes Father   . Sudden death Brother   . Mental illness Brother   . Heart attack Brother     MI @ 66, died @ age 24.  . Breast cancer Neg Hx    Social History   Social History  . Marital status: Widowed    Spouse name: N/A  . Number of children: N/A  . Years of education: N/A   Social History Main Topics  . Smoking status: Passive Smoke  Exposure - Never Smoker  . Smokeless tobacco: Never Used     Comment: husband and sons smoked in her home.  . Alcohol use 0.6 oz/week    1 Glasses of wine per week     Comment: rare wine cooler.  . Drug use: No  . Sexual activity: Not Asked   Other Topics Concern  . None   Social History Narrative   Widowed.   Disabled (colon/lung CA), retired.   Lives in [place] with her dtr, grand-dtr, and son.   Active.   Gets regular exercise, walks.    Outpatient Encounter Prescriptions as of 02/03/2016  Medication Sig  . albuterol (PROVENTIL HFA;VENTOLIN HFA) 108 (90 Base) MCG/ACT inhaler Inhale 2 puffs into the lungs every 6 (six) hours as needed for wheezing.  Marland Kitchen ALPRAZolam (XANAX) 0.5 MG tablet Take 1 tablet (0.5 mg total) by mouth daily as needed for anxiety.  Marland Kitchen aspirin EC 81 MG tablet Take 81 mg by mouth daily.  . colestipol (COLESTID) 1 g tablet Take 1 tablet (1 g total) by mouth 2 (two) times daily.  . Multiple Vitamin (MULTIVITAMIN)  tablet Take 1 tablet by mouth daily.  Marland Kitchen omeprazole (PRILOSEC) 20 MG capsule Take 20 mg by mouth daily.  . potassium chloride SA (K-DUR,KLOR-CON) 20 MEQ tablet Take 1 tablet (20 mEq total) by mouth 2 (two) times daily.  . traZODone (DESYREL) 50 MG tablet Take 1.5 tablets (75 mg total) by mouth at bedtime.  . triamterene-hydrochlorothiazide (MAXZIDE-25) 37.5-25 MG tablet Take 0.5 tablets by mouth daily.  . [DISCONTINUED] albuterol (PROVENTIL HFA;VENTOLIN HFA) 108 (90 Base) MCG/ACT inhaler Inhale 2 puffs into the lungs every 6 (six) hours as needed for wheezing.  . [DISCONTINUED] colestipol (COLESTID) 1 g tablet Take 1 g by mouth 4 (four) times daily.  . [DISCONTINUED] potassium chloride SA (K-DUR,KLOR-CON) 20 MEQ tablet TAKE 1 TABLET BY MOUTH TWICE A DAY.  . [DISCONTINUED] traZODone (DESYREL) 50 MG tablet Take 1.5 tablets (75 mg total) by mouth at bedtime.  . [DISCONTINUED] triamterene-hydrochlorothiazide (MAXZIDE-25) 37.5-25 MG tablet Take 1 tablet by mouth daily. (Patient taking differently: Take 0.5 tablets by mouth daily. )  . [DISCONTINUED] amoxicillin-clavulanate (AUGMENTIN) 875-125 MG tablet Take 1 tablet by mouth 2 (two) times daily.  . [DISCONTINUED] cyclobenzaprine (FLEXERIL) 5 MG tablet Take 1 tablet (5 mg total) by mouth 3 (three) times daily as needed for muscle spasms.   No facility-administered encounter medications on file as of 02/03/2016.     Review of Systems  Constitutional: Negative for appetite change and unexpected weight change.  HENT: Negative for congestion and sinus pressure.   Respiratory: Negative for cough, chest tightness and shortness of breath.   Cardiovascular: Negative for chest pain, palpitations and leg swelling.  Gastrointestinal: Negative for abdominal pain, diarrhea, nausea and vomiting.  Genitourinary: Negative for difficulty urinating and dysuria.  Musculoskeletal: Negative for joint swelling.       Back pain is better.    Skin: Negative for color  change and rash.  Neurological: Negative for light-headedness and headaches.  Psychiatric/Behavioral: Negative for agitation and dysphoric mood.       Some increased stress as outlined.         Objective:    Physical Exam  Constitutional: She appears well-developed and well-nourished. No distress.  HENT:  Nose: Nose normal.  Mouth/Throat: Oropharynx is clear and moist.  Neck: Neck supple. No thyromegaly present.  Cardiovascular: Normal rate and regular rhythm.   Pulmonary/Chest: Breath sounds normal. No respiratory distress. She  has no wheezes.  Abdominal: Soft. Bowel sounds are normal. There is no tenderness.  Musculoskeletal: She exhibits no edema or tenderness.  Lymphadenopathy:    She has no cervical adenopathy.  Skin: No rash noted. No erythema.  Psychiatric: She has a normal mood and affect. Her behavior is normal.    BP 110/80   Pulse 80   Temp 98.2 F (36.8 C) (Oral)   Resp 18   Ht _0  (1.6 m)   Wt 189 lb (85.7 kg)   SpO2 98%   BMI 33.48 kg/m  Wt Readings from Last 3 Encounters:  02/03/16 189 lb (85.7 kg)  12/28/15 191 lb (86.6 kg)  12/15/15 191 lb 12 oz (87 kg)     Lab Results  Component Value Date   WBC 7.8 01/30/2016   HGB 13.3 01/30/2016   HCT 39.2 01/30/2016   PLT 295.0 01/30/2016   GLUCOSE 106 (H) 01/30/2016   CHOL 225 (H) 01/30/2016   TRIG 118.0 01/30/2016   HDL 59.00 01/30/2016   LDLDIRECT 163.4 12/31/2012   LDLCALC 142 (H) 01/30/2016   ALT 12 01/30/2016   AST 22 01/30/2016   NA 137 01/30/2016   K 4.4 01/30/2016   CL 103 01/30/2016   CREATININE 1.25 (H) 01/30/2016   BUN 17 01/30/2016   CO2 26 01/30/2016   TSH 3.393 05/04/2015   INR 1.0 12/12/2012   HGBA1C 6.0 01/30/2016      Assessment & Plan:   Problem List Items Addressed This Visit    Hypercholesterolemia    Discussed recent cholesterol results.  Low cholesterol diet and exercise.  Follow lipid panel.  Will restart colestid.  Unable to take statin medications.        Relevant Medications   triamterene-hydrochlorothiazide (MAXZIDE-25) 37.5-25 MG tablet   colestipol (COLESTID) 1 g tablet   Hyperglycemia    Low carb diet and exercise.  Follow met b and a1c.        Hypertension    Blood pressure looks good on check here in the office.  Have her spot check her pressure.  Follow.  Same medication regimen.        Relevant Medications   triamterene-hydrochlorothiazide (MAXZIDE-25) 37.5-25 MG tablet   colestipol (COLESTID) 1 g tablet   Lung cancer ([place])    Followed by oncology.       Major depressive disorder with single episode ([place])    Stable on current regiment.  Follow.        Relevant Medications   traZODone (DESYREL) 50 MG tablet   Malignant neoplasm of colon ([place])    Colonoscopy 04/25/15 as outlined.  Followed by GI and oncology.  They are following CEA level.        TIA (transient ischemic attack)    On aspirin daily.  Previous w/up included carotid ultrasound that revealed no significant stenosis.  Did have changes c/w fibromuscular dysplasia.  MRI revealed no acute abnormality.  Chronic white matter changes.  Had CTA.  Evaluated by vascular surgery.  Discussed f/u with vascular surgery.  She wants to wait a couple of months.  Wants to schedule at next appt.  Follow.        Relevant Medications   triamterene-hydrochlorothiazide (MAXZIDE-25) 37.5-25 MG tablet   colestipol (COLESTID) 1 g tablet    Other Visit Diagnoses    Encounter for immunization       Relevant Orders   Flu Vaccine QUAD 36+ mos IM (Completed)       Einar Pheasant, MD

## 2016-02-03 NOTE — Telephone Encounter (Signed)
I am seeing her in f/u in 6 weeks.  We can discuss at that appt.  Please make note on schedule to discuss osteoporosis.  Thanks

## 2016-02-03 NOTE — Telephone Encounter (Signed)
Patient has been notified

## 2016-02-03 NOTE — Telephone Encounter (Signed)
Victoria Hanson called saying she forgot to tell Dr. Nicki Reaper that she was diagnosed as having Osteoporosis. She's wondering if she needs to be placed on medication for this. Please give her a phone call.  Pt's ph# [phone] Thank you.

## 2016-02-03 NOTE — Progress Notes (Signed)
Pre-visit discussion using our clinic review tool. No additional management support is needed unless otherwise documented below in the visit note.  

## 2016-02-05 ENCOUNTER — Encounter: Payer: Self-pay | Admitting: Internal Medicine

## 2016-02-05 NOTE — Assessment & Plan Note (Signed)
Discussed recent cholesterol results.  Low cholesterol diet and exercise.  Follow lipid panel.  Will restart colestid.  Unable to take statin medications.

## 2016-02-05 NOTE — Assessment & Plan Note (Signed)
Colonoscopy 04/25/15 as outlined.  Followed by GI and oncology.  They are following CEA level.

## 2016-02-05 NOTE — Assessment & Plan Note (Signed)
Low carb diet and exercise.  Follow met b and a1c.   

## 2016-02-05 NOTE — Assessment & Plan Note (Signed)
Stable on current regiment.  Follow.

## 2016-02-05 NOTE — Assessment & Plan Note (Signed)
Blood pressure looks good on check here in the office.  Have her spot check her pressure.  Follow.  Same medication regimen.

## 2016-02-05 NOTE — Assessment & Plan Note (Signed)
On aspirin daily.  Previous w/up included carotid ultrasound that revealed no significant stenosis.  Did have changes c/w fibromuscular dysplasia.  MRI revealed no acute abnormality.  Chronic white matter changes.  Had CTA.  Evaluated by vascular surgery.  Discussed f/u with vascular surgery.  She wants to wait a couple of months.  Wants to schedule at next appt.  Follow.

## 2016-02-05 NOTE — Assessment & Plan Note (Signed)
Followed by oncology 

## 2016-02-15 ENCOUNTER — Telehealth: Payer: Self-pay | Admitting: Internal Medicine

## 2016-02-15 DIAGNOSIS — I773 Arterial fibromuscular dysplasia: Secondary | ICD-10-CM

## 2016-02-15 NOTE — Telephone Encounter (Signed)
Please advise 

## 2016-02-15 NOTE — Telephone Encounter (Signed)
Pt called wanting to get a referral to see the vein and vascular. Please advise?  Call pt @ [phone]. Thank you!

## 2016-02-16 ENCOUNTER — Telehealth: Payer: Self-pay | Admitting: *Deleted

## 2016-02-16 DIAGNOSIS — C189 Malignant neoplasm of colon, unspecified: Secondary | ICD-10-CM

## 2016-02-16 NOTE — Telephone Encounter (Signed)
Per Dr Mike Gip, she can come in next week for lab md CBC, CMP, CEA. A [facility] scheduler will make appt and call patient back

## 2016-02-16 NOTE — Telephone Encounter (Signed)
Asking for an appt to be seen for persistent nausea. Reports that she had a colonoscopy which was nml, She was given an appt to see Dr Mike Gip on 10/2, but wants to be seen sooner

## 2016-02-16 NOTE — Telephone Encounter (Signed)
Order placed for referral to [facility].

## 2016-02-23 ENCOUNTER — Other Ambulatory Visit: Payer: Self-pay | Admitting: *Deleted

## 2016-02-23 ENCOUNTER — Other Ambulatory Visit: Payer: Self-pay

## 2016-02-23 ENCOUNTER — Inpatient Hospital Stay (HOSPITAL_BASED_OUTPATIENT_CLINIC_OR_DEPARTMENT_OTHER): Payer: Commercial Managed Care - HMO | Admitting: Hematology and Oncology

## 2016-02-23 ENCOUNTER — Encounter: Payer: Self-pay | Admitting: Hematology and Oncology

## 2016-02-23 ENCOUNTER — Inpatient Hospital Stay: Payer: Commercial Managed Care - HMO | Attending: Hematology and Oncology

## 2016-02-23 VITALS — BP 114/77 | HR 97 | Temp 98.5°F | Resp 18 | Wt 189.2 lb

## 2016-02-23 DIAGNOSIS — E7800 Pure hypercholesterolemia, unspecified: Secondary | ICD-10-CM | POA: Diagnosis not present

## 2016-02-23 DIAGNOSIS — C7801 Secondary malignant neoplasm of right lung: Secondary | ICD-10-CM

## 2016-02-23 DIAGNOSIS — J449 Chronic obstructive pulmonary disease, unspecified: Secondary | ICD-10-CM | POA: Diagnosis not present

## 2016-02-23 DIAGNOSIS — N200 Calculus of kidney: Secondary | ICD-10-CM | POA: Insufficient documentation

## 2016-02-23 DIAGNOSIS — Z85118 Personal history of other malignant neoplasm of bronchus and lung: Secondary | ICD-10-CM

## 2016-02-23 DIAGNOSIS — Z85038 Personal history of other malignant neoplasm of large intestine: Secondary | ICD-10-CM

## 2016-02-23 DIAGNOSIS — Z79899 Other long term (current) drug therapy: Secondary | ICD-10-CM | POA: Insufficient documentation

## 2016-02-23 DIAGNOSIS — K219 Gastro-esophageal reflux disease without esophagitis: Secondary | ICD-10-CM | POA: Diagnosis not present

## 2016-02-23 DIAGNOSIS — F329 Major depressive disorder, single episode, unspecified: Secondary | ICD-10-CM | POA: Diagnosis not present

## 2016-02-23 DIAGNOSIS — Z7982 Long term (current) use of aspirin: Secondary | ICD-10-CM | POA: Insufficient documentation

## 2016-02-23 DIAGNOSIS — C189 Malignant neoplasm of colon, unspecified: Secondary | ICD-10-CM

## 2016-02-23 DIAGNOSIS — K648 Other hemorrhoids: Secondary | ICD-10-CM

## 2016-02-23 DIAGNOSIS — F419 Anxiety disorder, unspecified: Secondary | ICD-10-CM | POA: Diagnosis not present

## 2016-02-23 DIAGNOSIS — I10 Essential (primary) hypertension: Secondary | ICD-10-CM | POA: Insufficient documentation

## 2016-02-23 DIAGNOSIS — Z9221 Personal history of antineoplastic chemotherapy: Secondary | ICD-10-CM | POA: Diagnosis not present

## 2016-02-23 LAB — COMPREHENSIVE METABOLIC PANEL
ALT: 16 U/L (ref 14–54)
AST: 20 U/L (ref 15–41)
Albumin: 3.8 g/dL (ref 3.5–5.0)
Alkaline Phosphatase: 76 U/L (ref 38–126)
Anion gap: 6 (ref 5–15)
BUN: 18 mg/dL (ref 6–20)
CO2: 25 mmol/L (ref 22–32)
Calcium: 9.2 mg/dL (ref 8.9–10.3)
Chloride: 105 mmol/L (ref 101–111)
Creatinine, Ser: 1.29 mg/dL — ABNORMAL HIGH (ref 0.44–1.00)
GFR calc Af Amer: 49 mL/min — ABNORMAL LOW (ref >60)
GFR calc non Af Amer: 42 mL/min — ABNORMAL LOW (ref >60)
Glucose, Bld: 112 mg/dL — ABNORMAL HIGH (ref 65–99)
Potassium: 3.9 mmol/L (ref 3.5–5.1)
Sodium: 136 mmol/L (ref 135–145)
Total Bilirubin: 0.5 mg/dL (ref 0.3–1.2)
Total Protein: 7 g/dL (ref 6.5–8.1)

## 2016-02-23 LAB — CBC
HCT: 35.5 % (ref 35.0–47.0)
Hemoglobin: 12.1 g/dL (ref 12.0–16.0)
MCH: 28.8 pg (ref 26.0–34.0)
MCHC: 34.1 g/dL (ref 32.0–36.0)
MCV: 84.4 fL (ref 80.0–100.0)
Platelets: 252 10*3/uL (ref 150–440)
RBC: 4.21 MIL/uL (ref 3.80–5.20)
RDW: 13.4 % (ref 11.5–14.5)
WBC: 9.5 10*3/uL (ref 3.6–11.0)

## 2016-02-23 NOTE — Progress Notes (Signed)
Patient states she has nausea at times.  Otherwise, no complaints.

## 2016-02-23 NOTE — Progress Notes (Signed)
[facility] day:  02/23/2016  Chief Complaint: Victoria Hanson is a 65 y.o. female with metastatic colon cancer who is seen for reassessment.  HPI: patient was diagnosed with colon cancer in 10/1998.  She presented with blood in her stool and swollen feet.  Colonoscopy by Dr. Vira Hanson revealed a mass.  She underwent partial colectomy by Dr. Pat Hanson.  Stage was T2N0M0 (no details are available).  She did not receive chemotherapy.  She developed back pain while lifting followed by shingles the following day.  Imaging studies revealed a right lung lesion.  She underwent right upper lobe lobectomy in 01/17/2007.  Pathology confirmed metastatic colon cancer.  She received 6 months of FOLFOX chemotherapy (completed 08/2007).  Course was truncated secondary to neuropathy.  Abdomen and pelvic CT scan on 02/16/2015 revealed no evidence of metastatic disease.  Colonoscopy on 04/25/2015 by Dr. Gaylyn Hanson revealed only internal hemorrhoids.   The patient was last seen by Dr. Oliva Hanson on 05/17/2015.    At that time, she was doing well.  CXR on 11/07/2015 revealed minimal bibasilar atelectasis.  Head MRI on 09/01/2015 was normal.  CEA was 0.8 on 12/26/2011, 0.6 on 10/29/2012, 0.9 on 10/26/2013, and 0.8 on 05/17/2015   Symptomatically, the patient feels fine. She has intermittent nausea depending on what she eats.  Nausea does not occur all the time. Nausea occurs after eating. Nausea is described as seldom and not every day.  She notes that she has been followed by vein and vascular for left neck issues. She had a temporal artery biopsy which was negative. She states that her teeth were causing her pain.  Pain resolved after dental extraction.  She has COPD flares every now and then.  She states that it gets worse especially when it's hot outside.  Weight is down secondary to stressors in her home. She denies any other symptoms.   Past Medical History:  Diagnosis Date   . Anxiety   . Atypical chest pain    a. 2003 Cath: reportedly nl per pt Victoria Hanson);  b. 04/2015 [place] Admission, neg troponin.  . Colon cancer ([place])    a. 1999: Pt had partial colectomy. Did develop lung metastasis. Had right upper lobectomy in 01/2007 then had associated chemotherapy.  Marland Kitchen COPD (chronic obstructive pulmonary disease) ([place])   . DDD (degenerative disc disease), lumbosacral   . Depression   . Echocardiogram abnormal    a. 02/2010 Echo: EF 55-60%, mild LAE, no regional wall motion abnormalities  . Elevated transaminase level    a. ? NASH;  b. 06/2015 - nl LFTs.  . GERD (gastroesophageal reflux disease)   . Hematuria   . Holter monitor, abnormal    a. 12/2009 Holter monitoring: NSR, rare PACs and PVCs.  Marland Kitchen Hypercholesterolemia   . Hyperlipidemia   . Hypertension   . Leaky heart valve    a. Per pt report - no evidence of valvular abnormalities on prior echoes.  . Lung cancer ([place])    a. S/P resection (lobectomy - right)  . Morbid obesity ([place])   . Nephrolithiasis     Past Surgical History:  Procedure Laterality Date  . BREAST BIOPSY  10/07/2009   Right breast  . CARDIAC CATHETERIZATION  2003   negative as per pt report  . CHOLECYSTECTOMY  2000  . COLON SURGERY     Colon cancer, removed part of large colon  . COLONOSCOPY N/A 04/25/2015   Procedure: COLONOSCOPY;  Surgeon: Victoria Baltimore T  Victoria Agar, MD;  Location: [facility];  Service: Endoscopy;  Laterality: N/A;  . LOBECTOMY     Right lung, upper lobe right side removed  . TOTAL ABDOMINAL HYSTERECTOMY     abnormal bleeding  . TUBAL LIGATION  1980    Family History  Problem Relation Age of Onset  . Coronary artery disease Mother     died @ 64 of MI.  . Mental illness Mother   . Diabetes Mother   . Heart attack Father 2    MI  . Cancer Father     lung - died in his mid-70's  . Hypertension Father   . Diabetes Father   . Sudden death Brother   . Mental illness Brother   . Heart attack Brother     MI @ 51,  died @ age 45.  . Breast cancer Neg Hx     Social History:  reports that she is a non-smoker but has been exposed to tobacco smoke. She has never used smokeless tobacco. She reports that she drinks about 0.6 oz of alcohol per week . She reports that she does not use drugs.  She lives in [place].  The patient is alone today.  Allergies:  Allergies  Allergen Reactions  . Macrobid [Nitrofurantoin Macrocrystal] Shortness Of Breath and Palpitations  . Antihistamines, Diphenhydramine-Type Other (See Comments)    She does not know which antihistamine she has an allergic reaction to.  . Aspirin Other (See Comments)    Makes her sick on the stomach.  Of note, she can take ibuprofen.  . Ceftin [Cefuroxime Axetil] Other (See Comments)    Chest pains  . Celecoxib Other (See Comments)    Reaction: Blood Disorder GI bleeding.  Marland Kitchen Cymbalta [Duloxetine Hcl] Other (See Comments)    Reaction: unknown  . Diphenhydramine Other (See Comments)  . Escitalopram Other (See Comments)  . Lexapro [Escitalopram Oxalate] Other (See Comments)    Reaction: Difficulty with speech  . Metronidazole Other (See Comments)    Other reaction(s): Unknown  . Zoloft [Sertraline Hcl]   . Ciprofloxacin Other (See Comments)    Reaction: unknown    Current Medications: Current Outpatient Prescriptions  Medication Sig Dispense Refill  . albuterol (PROVENTIL HFA;VENTOLIN HFA) 108 (90 Base) MCG/ACT inhaler Inhale 2 puffs into the lungs every 6 (six) hours as needed for wheezing. 1 Inhaler 2  . ALPRAZolam (XANAX) 0.5 MG tablet Take 1 tablet (0.5 mg total) by mouth daily as needed for anxiety. 60 tablet 0  . aspirin EC 81 MG tablet Take 81 mg by mouth daily.    . colestipol (COLESTID) 1 g tablet Take 1 tablet (1 g total) by mouth 2 (two) times daily. 120 tablet 1  . Multiple Vitamin (MULTIVITAMIN) tablet Take 1 tablet by mouth daily.    Marland Kitchen omeprazole (PRILOSEC) 20 MG capsule Take 20 mg by mouth daily.    . potassium chloride SA  (K-DUR,KLOR-CON) 20 MEQ tablet Take 1 tablet (20 mEq total) by mouth 2 (two) times daily. 60 tablet 2  . traZODone (DESYREL) 50 MG tablet Take 1.5 tablets (75 mg total) by mouth at bedtime. 45 tablet 2  . triamterene-hydrochlorothiazide (MAXZIDE-25) 37.5-25 MG tablet Take 0.5 tablets by mouth daily. 30 tablet 1   No current facility-administered medications for this visit.     Review of Systems:  GENERAL:  Feels "fine".  Active.  No fevers or sweats.  Weight loss secondary to stress. PERFORMANCE STATUS (ECOG):  1 HEENT:  No visual changes, runny  nose, sore throat, mouth sores or tenderness. Lungs: COPD.  Shortness of breath with the heat.  No cough.  No hemoptysis. Cardiac:  No chest pain, palpitations, orthopnea, or PND. GI:  Nausea, intermittent.  No vomiting, diarrhea, constipation, melena or hematochezia.  Colonoscopy 04/2015. GU:  No urgency, frequency, dysuria, or hematuria. Musculoskeletal:  No back pain.  No joint pain.  No muscle tenderness. Extremities:  No pain or swelling. Skin:  No rashes or skin changes. Neuro:  No headache, numbness or weakness, balance or coordination issues. Endocrine:  No diabetes, thyroid issues, hot flashes or night sweats. Psych:  Stress at home.  No mood changes, depression or anxiety. Pain:  No focal pain. Review of systems:  All other systems reviewed and found to be negative.  Physical Exam: Blood pressure 114/77, pulse 97, temperature 98.5 F (36.9 C), temperature source Tympanic, resp. rate 18, weight 189 lb 2.5 oz (85.8 kg). GENERAL:  Well developed, well nourished, woman sitting comfortably in the exam room in no acute distress. MENTAL STATUS:  Alert and oriented to person, place and time. HEAD:  Short gray hair.  Normocephalic, atraumatic, face symmetric, no Cushingoid features. EYES:  Glasses.  Pupils equal round and reactive to light and accomodation.  No conjunctivitis or scleral icterus. ENT:  Oropharynx clear without lesion.  Tongue  normal. Mucous membranes moist.  RESPIRATORY:  Clear to auscultation without rales, wheezes or rhonchi. CARDIOVASCULAR:  Regular rate and rhythm without murmur, rub or gallop. ABDOMEN:  Soft, non-tender, with active bowel sounds, and no hepatosplenomegaly.  No masses. SKIN:  No rashes, ulcers or lesions. EXTREMITIES: No edema, no skin discoloration or tenderness.  No palpable cords. LYMPH NODES: No palpable cervical, supraclavicular, axillary or inguinal adenopathy  NEUROLOGICAL: Unremarkable. PSYCH:  Appropriate.   Orders Only on 02/23/2016  Component Date Value Ref Range Status  . WBC 02/23/2016 9.5  3.6 - 11.0 K/uL Final  . RBC 02/23/2016 4.21  3.80 - 5.20 MIL/uL Final  . Hemoglobin 02/23/2016 12.1  12.0 - 16.0 g/dL Final  . HCT 02/23/2016 35.5  35.0 - 47.0 % Final  . MCV 02/23/2016 84.4  80.0 - 100.0 fL Final  . MCH 02/23/2016 28.8  26.0 - 34.0 pg Final  . MCHC 02/23/2016 34.1  32.0 - 36.0 g/dL Final  . RDW 02/23/2016 13.4  11.5 - 14.5 % Final  . Platelets 02/23/2016 252  150 - 440 K/uL Final  . Sodium 02/23/2016 136  135 - 145 mmol/L Final  . Potassium 02/23/2016 3.9  3.5 - 5.1 mmol/L Final  . Chloride 02/23/2016 105  101 - 111 mmol/L Final  . CO2 02/23/2016 25  22 - 32 mmol/L Final  . Glucose, Bld 02/23/2016 112* 65 - 99 mg/dL Final  . BUN 02/23/2016 18  6 - 20 mg/dL Final  . Creatinine, Ser 02/23/2016 1.29* 0.44 - 1.00 mg/dL Final  . Calcium 02/23/2016 9.2  8.9 - 10.3 mg/dL Final  . Total Protein 02/23/2016 7.0  6.5 - 8.1 g/dL Final  . Albumin 02/23/2016 3.8  3.5 - 5.0 g/dL Final  . AST 02/23/2016 20  15 - 41 U/L Final  . ALT 02/23/2016 16  14 - 54 U/L Final  . Alkaline Phosphatase 02/23/2016 76  38 - 126 U/L Final  . Total Bilirubin 02/23/2016 0.5  0.3 - 1.2 mg/dL Final  . GFR calc non Af Amer 02/23/2016 42* >60 mL/min Final  . GFR calc Af Amer 02/23/2016 49* >60 mL/min Final   Comment: (NOTE) The eGFR  has been calculated using the CKD EPI equation. This calculation  has not been validated in all clinical situations. eGFR's persistently <60 mL/min signify possible Chronic Kidney Disease.   . Anion gap 02/23/2016 6  5 - 15 Final    Assessment:  Victoria Hanson is a 65 y.o. female with metastatic colon cancer s/p partial colectomy in 10/1998.   Pathologic stage was T2N0M0 (no details are available).  She did not receive chemotherapy.  Imaging studies revealed a right lung lesion.  She underwent right upper lobe lobectomy in 01/17/2007.  Pathology confirmed metastatic colon cancer.  She received 6 months of FOLFOX chemotherapy (completed 08/2007).  Course was truncated secondary to neuropathy.  Abdomen and pelvic CT scan on 02/16/2015 revealed no evidence of metastatic disease.  Colonoscopy on 04/25/2015 revealed only internal hemorrhoids.  CXR on 11/07/2015 revealed minimal bibasilar atelectasis.  Head MRI on 09/01/2015 was normal.  CEA was 0.8 on 12/26/2011, 0.6 on 10/29/2012, 0.9 on 10/26/2013, and 0.8 on 05/17/2015   Symptomatically, she feels fine.  She has chronic intermittent nausea related to eating.  Plan: 1.  Discuss medical history, diagnosis and management of colon cancer.  Patient notes recurrence was not associated with an elevated CEA.  Discuss plan for follow-up CT scan. 2.  Schedule chest, abdomen, pelvic CT scan. 3.  MD or RN to call patient with results 4.  RTC in 6 months for MD assessment, labs (CBC with diff, CMP, CEA).   Lequita Asal, MD  02/23/2016, 2:26 PM

## 2016-02-24 LAB — CEA: CEA: 0.8 ng/mL (ref 0.0–4.7)

## 2016-03-01 ENCOUNTER — Ambulatory Visit
Admission: RE | Admit: 2016-03-01 | Discharge: 2016-03-01 | Disposition: A | Discharge status: Home / Self Care | Payer: Commercial Managed Care - HMO | Source: Ambulatory Visit | Attending: Hematology and Oncology | Admitting: Hematology and Oncology

## 2016-03-01 DIAGNOSIS — I700 Atherosclerosis of aorta: Secondary | ICD-10-CM | POA: Insufficient documentation

## 2016-03-01 DIAGNOSIS — C189 Malignant neoplasm of colon, unspecified: Secondary | ICD-10-CM | POA: Insufficient documentation

## 2016-03-01 DIAGNOSIS — K429 Umbilical hernia without obstruction or gangrene: Secondary | ICD-10-CM | POA: Diagnosis not present

## 2016-03-10 ENCOUNTER — Emergency Department
Admission: EM | Admit: 2016-03-10 | Discharge: 2016-03-10 | Disposition: A | Discharge status: Home / Self Care | Payer: Commercial Managed Care - HMO | Attending: Student in an Organized Health Care Education/Training Program | Admitting: Student in an Organized Health Care Education/Training Program

## 2016-03-10 ENCOUNTER — Emergency Department: Payer: Commercial Managed Care - HMO

## 2016-03-10 ENCOUNTER — Encounter: Payer: Self-pay | Admitting: Emergency Medicine

## 2016-03-10 DIAGNOSIS — Z85118 Personal history of other malignant neoplasm of bronchus and lung: Secondary | ICD-10-CM | POA: Diagnosis not present

## 2016-03-10 DIAGNOSIS — R079 Chest pain, unspecified: Secondary | ICD-10-CM

## 2016-03-10 DIAGNOSIS — Z79899 Other long term (current) drug therapy: Secondary | ICD-10-CM | POA: Diagnosis not present

## 2016-03-10 DIAGNOSIS — Z85038 Personal history of other malignant neoplasm of large intestine: Secondary | ICD-10-CM | POA: Diagnosis not present

## 2016-03-10 DIAGNOSIS — N183 Chronic kidney disease, stage 3 (moderate): Secondary | ICD-10-CM | POA: Diagnosis not present

## 2016-03-10 DIAGNOSIS — R072 Precordial pain: Secondary | ICD-10-CM | POA: Insufficient documentation

## 2016-03-10 DIAGNOSIS — I129 Hypertensive chronic kidney disease with stage 1 through stage 4 chronic kidney disease, or unspecified chronic kidney disease: Secondary | ICD-10-CM | POA: Diagnosis not present

## 2016-03-10 DIAGNOSIS — J449 Chronic obstructive pulmonary disease, unspecified: Secondary | ICD-10-CM | POA: Insufficient documentation

## 2016-03-10 DIAGNOSIS — Z8673 Personal history of transient ischemic attack (TIA), and cerebral infarction without residual deficits: Secondary | ICD-10-CM | POA: Diagnosis not present

## 2016-03-10 DIAGNOSIS — Z7982 Long term (current) use of aspirin: Secondary | ICD-10-CM | POA: Insufficient documentation

## 2016-03-10 DIAGNOSIS — Z7722 Contact with and (suspected) exposure to environmental tobacco smoke (acute) (chronic): Secondary | ICD-10-CM | POA: Diagnosis not present

## 2016-03-10 LAB — CBC
HEMATOCRIT: 36.6 % (ref 35.0–47.0)
Hemoglobin: 12.5 g/dL (ref 12.0–16.0)
MCH: 28.8 pg (ref 26.0–34.0)
MCHC: 34.1 g/dL (ref 32.0–36.0)
MCV: 84.5 fL (ref 80.0–100.0)
Platelets: 223 10*3/uL (ref 150–440)
RBC: 4.33 MIL/uL (ref 3.80–5.20)
RDW: 13.6 % (ref 11.5–14.5)
WBC: 9.1 10*3/uL (ref 3.6–11.0)

## 2016-03-10 LAB — URINALYSIS COMPLETE WITH MICROSCOPIC (ARMC ONLY)
BACTERIA UA: NONE SEEN
Bilirubin Urine: NEGATIVE
GLUCOSE, UA: NEGATIVE mg/dL
HGB URINE DIPSTICK: NEGATIVE
Ketones, ur: NEGATIVE mg/dL
Nitrite: NEGATIVE
PH: 6 (ref 5.0–8.0)
Protein, ur: NEGATIVE mg/dL
Specific Gravity, Urine: 1.009 (ref 1.005–1.030)

## 2016-03-10 LAB — BASIC METABOLIC PANEL
Anion gap: 6 (ref 5–15)
BUN: 24 mg/dL — AB (ref 6–20)
CALCIUM: 9.4 mg/dL (ref 8.9–10.3)
CO2: 27 mmol/L (ref 22–32)
CREATININE: 1.31 mg/dL — AB (ref 0.44–1.00)
Chloride: 106 mmol/L (ref 101–111)
GFR calc Af Amer: 48 mL/min — ABNORMAL LOW (ref >60)
GFR, EST NON AFRICAN AMERICAN: 41 mL/min — AB (ref >60)
GLUCOSE: 105 mg/dL — AB (ref 65–99)
Potassium: 4.2 mmol/L (ref 3.5–5.1)
Sodium: 139 mmol/L (ref 135–145)

## 2016-03-10 LAB — FIBRIN DERIVATIVES D-DIMER (ARMC ONLY): FIBRIN DERIVATIVES D-DIMER (ARMC): 641 — AB (ref 0–499)

## 2016-03-10 LAB — TROPONIN I
Troponin I: <0.03 ng/mL (ref <0.03)
Troponin I: <0.03 ng/mL (ref <0.03)

## 2016-03-10 MED ORDER — PREDNISONE 20 MG PO TABS: 40.0000 mg | ORAL_TABLET | Freq: Every day | ORAL | 0 refills | Status: AC

## 2016-03-10 MED ORDER — IPRATROPIUM-ALBUTEROL 0.5-2.5 (3) MG/3ML IN SOLN
3.0000 mL | Freq: Once | RESPIRATORY_TRACT | Status: AC
  Administered 2016-03-10: 3 mL via RESPIRATORY_TRACT
  Filled 2016-03-10: qty 3

## 2016-03-10 MED ORDER — DIAZEPAM 5 MG PO TABS
5.0000 mg | ORAL_TABLET | Freq: Once | ORAL | Status: AC
  Administered 2016-03-10: 5 mg via ORAL
  Filled 2016-03-10: qty 1

## 2016-03-10 MED ORDER — IOPAMIDOL (ISOVUE-370) INJECTION 76%
60.0000 mL | Freq: Once | INTRAVENOUS | Status: AC | PRN
  Administered 2016-03-10: 60 mL via INTRAVENOUS

## 2016-03-10 MED ORDER — PREDNISONE 20 MG PO TABS: 40.0000 mg | ORAL_TABLET | Freq: Every day | ORAL | 0 refills | Status: DC

## 2016-03-10 MED ORDER — FENTANYL CITRATE (PF) 100 MCG/2ML IJ SOLN
100.0000 ug | INTRAMUSCULAR | Status: DC | PRN
  Filled 2016-03-10: qty 2

## 2016-03-10 MED ORDER — SODIUM CHLORIDE 0.9 % IV BOLUS (SEPSIS)
1000.0000 mL | Freq: Once | INTRAVENOUS | Status: AC
  Administered 2016-03-10: 1000 mL via INTRAVENOUS

## 2016-03-10 MED ORDER — ACETAMINOPHEN 500 MG PO TABS
1000.0000 mg | ORAL_TABLET | Freq: Once | ORAL | Status: AC
  Administered 2016-03-10: 1000 mg via ORAL
  Filled 2016-03-10: qty 2

## 2016-03-10 MED ORDER — ALBUTEROL SULFATE HFA 108 (90 BASE) MCG/ACT IN AERS: 2.0000 | INHALATION_SPRAY | Freq: Four times a day (QID) | RESPIRATORY_TRACT | 2 refills | Status: DC | PRN

## 2016-03-10 NOTE — ED Notes (Signed)
Joneen Caraway ED medic attempted IV access x1 unsuccessful

## 2016-03-10 NOTE — ED Provider Notes (Signed)
[facility] Emergency Department Provider Note    First MD Initiated Contact with Patient 03/10/16 1232     (approximate)  I have reviewed the triage vital signs and the nursing notes.   HISTORY  Chief Complaint Chest Pain    HPI Victoria Hanson is a 65 y.o. female presents with 24 hours of midsternal chest pain that is worsened with palpation and taking a deep breath. States that she is having some shortness of breath worse than normal. No change with exertion.She does have a history of COPD but does not wear any home oxygen. He has a history of elevated blood pressure but no known coronary disease area. Eyes any pain radiating through to her back or shoulders. Denies abdominal pain. No nausea or vomiting. No diaphoresis. No fevers or cough.   Past Medical History:  Diagnosis Date  . Anxiety   . Atypical chest pain    a. 2003 Cath: reportedly nl per pt [name]);  b. 04/2015 [place] Admission, neg troponin.  . Colon cancer ([place])    a. 1999: Pt had partial colectomy. Did develop lung metastasis. Had right upper lobectomy in 01/2007 then had associated chemotherapy.  Marland Kitchen COPD (chronic obstructive pulmonary disease) ([place])   . DDD (degenerative disc disease), lumbosacral   . Depression   . Echocardiogram abnormal    a. 02/2010 Echo: EF 55-60%, mild LAE, no regional wall motion abnormalities  . Elevated transaminase level    a. ? NASH;  b. 06/2015 - nl LFTs.  . GERD (gastroesophageal reflux disease)   . Hematuria   . Holter monitor, abnormal    a. 12/2009 Holter monitoring: NSR, rare PACs and PVCs.  Marland Kitchen Hypercholesterolemia   . Hyperlipidemia   . Hypertension   . Leaky heart valve    a. Per pt report - no evidence of valvular abnormalities on prior echoes.  . Lung cancer ([place])    a. S/P resection (lobectomy - right)  . Morbid obesity ([place])   . Nephrolithiasis     Patient Active Problem List   Diagnosis Date Noted  . Sinus pressure 08/03/2015  . Foot  pain, bilateral 07/24/2015  . Hyperlipidemia 06/21/2015  . Atypical chest pain 06/21/2015  . Other fatigue 06/21/2015  . Morbid obesity due to excess calories ([place]) 06/21/2015  . Morbid obesity ([place])   . Abnormal mammogram 06/19/2015  . Urinary system disease 03/28/2015  . Hyperglycemia 03/20/2015  . Right lower quadrant abdominal pain 02/03/2015  . Swelling of lower extremity 11/20/2014  . H/O malignant neoplasm of colon 11/19/2014  . Dysuria 10/24/2014  . Flatulence, eructation and gas pain 10/24/2014  . Health care maintenance 07/17/2014  . Hypokalemia 04/05/2013  . TIA (transient ischemic attack) 02/09/2013  . Temporary cerebral vascular dysfunction 02/09/2013  . Chest pain at rest 05/07/2012  . Anxiety, mild 03/13/2012  . Depression, major, recurrent ([place]) 03/13/2012  . Colon cancer ([place]) 03/13/2012  . Hypertension 03/13/2012  . CKD (chronic kidney disease), stage III 03/13/2012  . GERD (gastroesophageal reflux disease) 03/13/2012  . Acid reflux 03/13/2012  . Essential (primary) hypertension 03/13/2012  . Lung metastasis ([place]) 03/13/2012  . Major depressive disorder with single episode ([place]) 03/13/2012  . Malignant neoplasm of colon ([place]) 03/13/2012  . Hypercholesterolemia 02/02/2010  . Pure hypercholesterolemia 02/02/2010    Past Surgical History:  Procedure Laterality Date  . BREAST BIOPSY  10/07/2009   Right breast  . CARDIAC CATHETERIZATION  2003   negative as per pt report  . CHOLECYSTECTOMY  2000  . COLON SURGERY     Colon cancer, removed part of large colon  . COLONOSCOPY N/A 04/25/2015   Procedure: COLONOSCOPY;  Surgeon: Manya Silvas, MD;  [facility] END[facility]COPY;  Service: Endoscopy;  Laterality: N/A;  . LOBECTOMY     Right lung, upper lobe right side removed  . TOTAL ABDOMINAL HYSTERECTOMY     abnormal bleeding  . TUBAL LIGATION  1980    Prior to Admission medications   Medication Sig Start Date End Date Taking? Authorizing Provider  albuterol  (PROVENTIL HFA;VENTOLIN HFA) 108 (90 Base) MCG/ACT inhaler Inhale 2 puffs into the lungs every 6 (six) hours as needed for wheezing. 02/03/16   Einar Pheasant, MD  albuterol (PROVENTIL HFA;VENTOLIN HFA) 108 (90 Base) MCG/ACT inhaler Inhale 2 puffs into the lungs every 6 (six) hours as needed for wheezing or shortness of breath. 03/10/16   Merlyn Lot, MD  ALPRAZolam Duanne Moron) 0.5 MG tablet Take 1 tablet (0.5 mg total) by mouth daily as needed for anxiety. 12/05/15   Einar Pheasant, MD  aspirin EC 81 MG tablet Take 81 mg by mouth daily.    Historical Provider, MD  colestipol (COLESTID) 1 g tablet Take 1 tablet (1 g total) by mouth 2 (two) times daily. 02/03/16   Einar Pheasant, MD  Multiple Vitamin (MULTIVITAMIN) tablet Take 1 tablet by mouth daily.    Historical Provider, MD  omeprazole (PRILOSEC) 20 MG capsule Take 20 mg by mouth daily.    Historical Provider, MD  potassium chloride SA (K-DUR,KLOR-CON) 20 MEQ tablet Take 1 tablet (20 mEq total) by mouth 2 (two) times daily. 02/03/16   Einar Pheasant, MD  predniSONE (DELTASONE) 20 MG tablet Take 2 tablets (40 mg total) by mouth daily. 03/10/16 03/14/16  Merlyn Lot, MD  predniSONE (DELTASONE) 20 MG tablet Take 2 tablets (40 mg total) by mouth daily. 03/10/16 03/10/17  Merlyn Lot, MD  traZODone (DESYREL) 50 MG tablet Take 1.5 tablets (75 mg total) by mouth at bedtime. 02/03/16   Einar Pheasant, MD  triamterene-hydrochlorothiazide (MAXZIDE-25) 37.5-25 MG tablet Take 0.5 tablets by mouth daily. 02/03/16   Einar Pheasant, MD    Allergies Macrobid [nitrofurantoin macrocrystal]; Antihistamines, diphenhydramine-type; Aspirin; Ceftin [cefuroxime axetil]; Celecoxib; Cymbalta [duloxetine hcl]; Diphenhydramine; Escitalopram; Lexapro [escitalopram oxalate]; Metronidazole; Zoloft [sertraline hcl]; and Ciprofloxacin  Family History  Problem Relation Age of Onset  . Coronary artery disease Mother     died @ 12 of MI.  . Mental illness Mother   . Diabetes  Mother   . Heart attack Father 70    MI  . Cancer Father     lung - died in his mid-70's  . Hypertension Father   . Diabetes Father   . Sudden death Brother   . Mental illness Brother   . Heart attack Brother     MI @ 25, died @ age 23.  . Breast cancer Neg Hx     Social History Social History  Substance Use Topics  . Smoking status: Passive Smoke Exposure - Never Smoker  . Smokeless tobacco: Never Used     Comment: husband and sons smoked in her home.  . Alcohol use 0.6 oz/week    1 Glasses of wine per week     Comment: rare wine cooler.    Review of Systems Patient denies headaches, rhinorrhea, blurry vision, numbness, shortness of breath, chest pain, edema, cough, abdominal pain, nausea, vomiting, diarrhea, dysuria, fevers, rashes or hallucinations unless otherwise stated above in HPI. ____________________________________________   PHYSICAL  EXAM:  VITAL SIGNS: Vitals:   03/10/16 1608 03/10/16 1631  BP: (!) 160/71 (!) 163/64  Pulse: 66 69  Resp: 16 17  Temp:      Constitutional: Alert and oriented. Well appearing and in no acute distress. Eyes: Conjunctivae are normal. PERRL. EOMI. Head: Atraumatic. Nose: No congestion/rhinnorhea. Mouth/Throat: Mucous membranes are moist.  Oropharynx non-erythematous. Neck: No stridor. Painless ROM. No cervical spine tenderness to palpation Hematological/Lymphatic/Immunilogical: No cervical lymphadenopathy. Cardiovascular: Normal rate, regular rhythm. Grossly normal heart sounds.  Good peripheral circulation. Respiratory: Normal respiratory effort.  No retractions. Diminished breath sounds bilaterally Gastrointestinal: Soft and nontender. No distention. No abdominal bruits. No CVA tenderness. Genitourinary:  Musculoskeletal: No lower extremity tenderness nor edema.  No joint effusions. Neurologic:  Normal speech and language. No gross focal neurologic deficits are appreciated. No gait instability. Skin:  Skin is warm, dry and  intact. No rash noted. Psychiatric: Mood and affect are normal. Speech and behavior are normal.  ____________________________________________   LABS (all labs ordered are listed, but only abnormal results are displayed)  Results for orders placed or performed during the hospital encounter of 03/10/16 (from the past 24 hour(s))  Basic metabolic panel     Status: Abnormal   Collection Time: 03/10/16  1:20 PM  Result Value Ref Range   Sodium 139 135 - 145 mmol/L   Potassium 4.2 3.5 - 5.1 mmol/L   Chloride 106 101 - 111 mmol/L   CO2 27 22 - 32 mmol/L   Glucose, Bld 105 (H) 65 - 99 mg/dL   BUN 24 (H) 6 - 20 mg/dL   Creatinine, Ser 1.31 (H) 0.44 - 1.00 mg/dL   Calcium 9.4 8.9 - 10.3 mg/dL   GFR calc non Af Amer 41 (L) >60 mL/min   GFR calc Af Amer 48 (L) >60 mL/min   Anion gap 6 5 - 15  CBC     Status: None   Collection Time: 03/10/16  1:20 PM  Result Value Ref Range   WBC 9.1 3.6 - 11.0 K/uL   RBC 4.33 3.80 - 5.20 MIL/uL   Hemoglobin 12.5 12.0 - 16.0 g/dL   HCT 36.6 35.0 - 47.0 %   MCV 84.5 80.0 - 100.0 fL   MCH 28.8 26.0 - 34.0 pg   MCHC 34.1 32.0 - 36.0 g/dL   RDW 13.6 11.5 - 14.5 %   Platelets 223 150 - 440 K/uL  Troponin I     Status: None   Collection Time: 03/10/16  1:20 PM  Result Value Ref Range   Troponin I <0.03 <0.03 ng/mL  Fibrin derivatives D-Dimer ([facility] only)     Status: Abnormal   Collection Time: 03/10/16  1:20 PM  Result Value Ref Range   Fibrin derivatives D-dimer ([facility]) 641 (H) 0 - 499  Urinalysis complete, with microscopic ([facility] only)     Status: Abnormal   Collection Time: 03/10/16  3:05 PM  Result Value Ref Range   Color, Urine STRAW (A) YELLOW   APPearance CLEAR (A) CLEAR   Glucose, UA NEGATIVE NEGATIVE mg/dL   Bilirubin Urine NEGATIVE NEGATIVE   Ketones, ur NEGATIVE NEGATIVE mg/dL   Specific Gravity, Urine 1.009 1.005 - 1.030   Hgb urine dipstick NEGATIVE NEGATIVE   pH 6.0 5.0 - 8.0   Protein, ur NEGATIVE NEGATIVE mg/dL   Nitrite NEGATIVE  NEGATIVE   Leukocytes, UA TRACE (A) NEGATIVE   RBC / HPF 0-5 0 - 5 RBC/hpf   WBC, UA 0-5 0 - 5 WBC/hpf  Bacteria, UA NONE SEEN NONE SEEN   Squamous Epithelial / LPF 0-5 (A) NONE SEEN   Mucous PRESENT    ____________________________________________  EKG My review and personal interpretation at Time: 12:19   Indication: chest pain  Rate: 75  Rhythm: normal Axis: normal Other: non specific st and t wave changes, no STEMI, unchanged from previous ____________________________________________  RADIOLOGY  I personally reviewed all radiographic images ordered to evaluate for the above acute complaints and reviewed radiology reports and findings.  These findings were personally discussed with the patient.  Please see medical record for radiology report.  ____________________________________________   PROCEDURES  Procedure(s) performed: none    Critical Care performed: no ____________________________________________   INITIAL IMPRESSION / ASSESSMENT AND PLAN / ED COURSE  Pertinent labs & imaging results that were available during my care of the patient were reviewed by me and considered in my medical decision making (see chart for details).  DDX: Asthma, copd, CHF, pna, ptx, malignancy, Pe, anemia   Victoria Hanson is a 65 y.o. who presents to the ED with history of COPD presents with 24 hours of chest pain shortness of breath that hurts with deep inspiration. Patient arrives afebrile and hemodynamic stable. Patient has at increased risk for DVT due to history of malignancy. No evidence of DVT or lower extremity edema. Presentation is not consistent with congestive heart failure. Less consistent with ACS of the pain is reproducible with deep inspiration and has no exertional component. EKG and troponin negative for acute ischemia 2. Less consistent with dissection. There is no evidence of pericarditis. Her abdominal exam is soft and benign. D-dimer was ordered to further risk  stratify for PE but was elevated. A further evaluate for pulmonary embolism CT angiogram was ordered which showed no evidence of acute PE. Based on her history of COPD with pleuritic chest pain I do feel this is more consistent with pleuritis and bronchitis. Will treat with steroids as well as albuterol inhalers and anti-inflammatories with instructions to follow-up with cardiology and primary care physician.  Have discussed with the patient and available family all diagnostics and treatments performed thus far and all questions were answered to the best of my ability. The patient demonstrates understanding and agreement with plan.   Clinical Course     ____________________________________________   FINAL CLINICAL IMPRESSION(S) / ED DIAGNOSES  Final diagnoses:  Chest pain, unspecified chest pain type      NEW MEDICATIONS STARTED DURING THIS VISIT:  New Prescriptions   ALBUTEROL (PROVENTIL HFA;VENTOLIN HFA) 108 (90 BASE) MCG/ACT INHALER    Inhale 2 puffs into the lungs every 6 (six) hours as needed for wheezing or shortness of breath.   PREDNISONE (DELTASONE) 20 MG TABLET    Take 2 tablets (40 mg total) by mouth daily.   PREDNISONE (DELTASONE) 20 MG TABLET    Take 2 tablets (40 mg total) by mouth daily.     Note:  This document was prepared using Dragon voice recognition software and may include unintentional dictation errors.    Merlyn Lot, MD 03/10/16 [phone]

## 2016-03-10 NOTE — ED Triage Notes (Signed)
Pt arrived via POV from home with c/o chest pain that started yesterday evening. Pt c/o pain when breathing and shortness of breath. Pt states she took ASA '81mg'$  x2 this morning with some relief for a short period.

## 2016-03-12 ENCOUNTER — Telehealth: Payer: Self-pay | Admitting: *Deleted

## 2016-03-12 ENCOUNTER — Ambulatory Visit: Payer: Commercial Managed Care - HMO | Admitting: Hematology and Oncology

## 2016-03-12 ENCOUNTER — Telehealth: Payer: Self-pay | Admitting: Cardiovascular Disease

## 2016-03-12 NOTE — Telephone Encounter (Signed)
If she is having persistent problems, needs to be reevaluated.  I can see her tomorrow at 11:00 tomorrow.  Block 30 minute spot.

## 2016-03-12 NOTE — Telephone Encounter (Signed)
Please advise 

## 2016-03-12 NOTE — Telephone Encounter (Signed)
Please schedule her for appmt

## 2016-03-12 NOTE — Telephone Encounter (Signed)
Lm for pt to call back and schedule appt

## 2016-03-12 NOTE — Telephone Encounter (Signed)
Pt was seen in the Er on 09/30 for bronchitis, she was given prednisone. Pt requested to have a Rx for an antibiotic, she feels the prednisone is not working. Pt contact [phone]

## 2016-03-12 NOTE — Telephone Encounter (Signed)
LMOV for patient to call back and schedule ED fu appointment with Dr Fletcher Anon

## 2016-03-12 NOTE — Telephone Encounter (Signed)
Victoria Hanson, can you schedule?  I think this was sent to me by mistake.

## 2016-03-13 ENCOUNTER — Ambulatory Visit (INDEPENDENT_AMBULATORY_CARE_PROVIDER_SITE_OTHER): Payer: Commercial Managed Care - HMO | Admitting: Internal Medicine

## 2016-03-13 ENCOUNTER — Encounter: Payer: Self-pay | Admitting: Internal Medicine

## 2016-03-13 DIAGNOSIS — N183 Chronic kidney disease, stage 3 unspecified: Secondary | ICD-10-CM

## 2016-03-13 DIAGNOSIS — I10 Essential (primary) hypertension: Secondary | ICD-10-CM

## 2016-03-13 DIAGNOSIS — F330 Major depressive disorder, recurrent, mild: Secondary | ICD-10-CM

## 2016-03-13 DIAGNOSIS — K219 Gastro-esophageal reflux disease without esophagitis: Secondary | ICD-10-CM | POA: Diagnosis not present

## 2016-03-13 DIAGNOSIS — R079 Chest pain, unspecified: Secondary | ICD-10-CM

## 2016-03-13 DIAGNOSIS — R739 Hyperglycemia, unspecified: Secondary | ICD-10-CM | POA: Diagnosis not present

## 2016-03-13 DIAGNOSIS — E7800 Pure hypercholesterolemia, unspecified: Secondary | ICD-10-CM

## 2016-03-13 NOTE — Progress Notes (Signed)
Pre visit review using our clinic review tool, if applicable. No additional management support is needed unless otherwise documented below in the visit note. 

## 2016-03-13 NOTE — Progress Notes (Signed)
Patient ID: KENNADI ALBANY, female   DOB: March 27, 1951, 65 y.o.   MRN: [id number]   Subjective:    Patient ID: SANDEE BERNATH, female    DOB: 06-26-1950, 65 y.o.   MRN: [id number]  HPI  Patient here for a scheduled follow up.  She was seen in the ER on 03/10/16 with chest pain and sob.  Note reviewed.  Pain was noted to be reproducible with deep inspiration.  EKG and troponin - ok.  D- dimer elevated.  CT angiogram - no PE.  She was diagnosed with pleuritis and bronchitis.  Treated with steroids and albuterol inhaler.  They recommended f/u with cardiology.  She comes in today for f/u.  States she does feel better.  Pain described as sharp pain.  Some sob.  Some sinus pressure with green mucus production.  No nausea or vomiting.  Bowels stable.     Past Medical History:  Diagnosis Date  . Anxiety   . Atypical chest pain    a. 2003 Cath: reportedly nl per pt [name]);  b. 04/2015 [place] Admission, neg troponin.  . Colon cancer ([place])    a. 1999: Pt had partial colectomy. Did develop lung metastasis. Had right upper lobectomy in 01/2007 then had associated chemotherapy.  Marland Kitchen COPD (chronic obstructive pulmonary disease) ([place])   . DDD (degenerative disc disease), lumbosacral   . Depression   . Echocardiogram abnormal    a. 02/2010 Echo: EF 55-60%, mild LAE, no regional wall motion abnormalities  . Elevated transaminase level    a. ? NASH;  b. 06/2015 - nl LFTs.  . GERD (gastroesophageal reflux disease)   . Hematuria   . Holter monitor, abnormal    a. 12/2009 Holter monitoring: NSR, rare PACs and PVCs.  Marland Kitchen Hypercholesterolemia   . Hyperlipidemia   . Hypertension   . Leaky heart valve    a. Per pt report - no evidence of valvular abnormalities on prior echoes.  . Lung cancer ([place])    a. S/P resection (lobectomy - right)  . Morbid obesity ([place])   . Nephrolithiasis    Past Surgical History:  Procedure Laterality Date  . BREAST BIOPSY  10/07/2009   Right breast  . CARDIAC CATHETERIZATION  2003     negative as per pt report  . CHOLECYSTECTOMY  2000  . COLON SURGERY     Colon cancer, removed part of large colon  . COLONOSCOPY N/A 04/25/2015   Procedure: COLONOSCOPY;  Surgeon: Manya Silvas, MD;  Location: [facility] ENDOSCOPY;  Service: Endoscopy;  Laterality: N/A;  . LOBECTOMY     Right lung, upper lobe right side removed  . TOTAL ABDOMINAL HYSTERECTOMY     abnormal bleeding  . TUBAL LIGATION  1980   Family History  Problem Relation Age of Onset  . Coronary artery disease Mother     died @ 65 of MI.  . Mental illness Mother   . Diabetes Mother   . Heart attack Father 39    MI  . Cancer Father     lung - died in his mid-70's  . Hypertension Father   . Diabetes Father   . Sudden death Brother   . Mental illness Brother   . Heart attack Brother     MI @ 73, died @ age 16.  . Breast cancer Neg Hx    Social History   Social History  . Marital status: Widowed    Spouse name: N/A  . Number of children: N/A  .  Years of education: N/A   Social History Main Topics  . Smoking status: Passive Smoke Exposure - Never Smoker  . Smokeless tobacco: Never Used     Comment: husband and sons smoked in her home.  . Alcohol use 0.6 oz/week    1 Glasses of wine per week     Comment: rare wine cooler.  . Drug use: No  . Sexual activity: Not Asked   Other Topics Concern  . None   Social History Narrative   Widowed.   Disabled (colon/lung CA), retired.   Lives in [place] with her dtr, grand-dtr, and son.   Active.   Gets regular exercise, walks.    Outpatient Encounter Prescriptions as of 03/13/2016  Medication Sig  . albuterol (PROVENTIL HFA;VENTOLIN HFA) 108 (90 Base) MCG/ACT inhaler Inhale 2 puffs into the lungs every 6 (six) hours as needed for wheezing.  Marland Kitchen albuterol (PROVENTIL HFA;VENTOLIN HFA) 108 (90 Base) MCG/ACT inhaler Inhale 2 puffs into the lungs every 6 (six) hours as needed for wheezing or shortness of breath.  . ALPRAZolam (XANAX) 0.5 MG tablet Take 1  tablet (0.5 mg total) by mouth daily as needed for anxiety.  Marland Kitchen aspirin EC 81 MG tablet Take 81 mg by mouth daily.  . colestipol (COLESTID) 1 g tablet Take 1 tablet (1 g total) by mouth 2 (two) times daily.  . Multiple Vitamin (MULTIVITAMIN) tablet Take 1 tablet by mouth daily.  Marland Kitchen omeprazole (PRILOSEC) 20 MG capsule Take 20 mg by mouth daily.  . potassium chloride SA (K-DUR,KLOR-CON) 20 MEQ tablet Take 1 tablet (20 mEq total) by mouth 2 (two) times daily.  . [EXPIRED] predniSONE (DELTASONE) 20 MG tablet Take 2 tablets (40 mg total) by mouth daily.  . predniSONE (DELTASONE) 20 MG tablet Take 2 tablets (40 mg total) by mouth daily.  . traZODone (DESYREL) 50 MG tablet Take 1.5 tablets (75 mg total) by mouth at bedtime.  . triamterene-hydrochlorothiazide (MAXZIDE-25) 37.5-25 MG tablet Take 0.5 tablets by mouth daily.   No facility-administered encounter medications on file as of 03/13/2016.     Review of Systems  Constitutional: Negative for appetite change and unexpected weight change.  HENT: Positive for congestion. Negative for sinus pressure.   Respiratory: Positive for shortness of breath. Negative for cough and chest tightness.   Cardiovascular: Positive for chest pain. Negative for palpitations and leg swelling.  Gastrointestinal: Negative for abdominal pain, diarrhea, nausea and vomiting.  Genitourinary: Negative for difficulty urinating and dysuria.  Musculoskeletal: Negative for back pain and joint swelling.  Skin: Negative for color change and rash.  Neurological: Negative for dizziness, light-headedness and headaches.  Psychiatric/Behavioral: Negative for agitation and dysphoric mood.       Objective:    Physical Exam  Constitutional: She appears well-developed and well-nourished. No distress.  HENT:  Nose: Nose normal.  Mouth/Throat: Oropharynx is clear and moist.  Neck: Neck supple. No thyromegaly present.  Cardiovascular: Normal rate and regular rhythm.     Pulmonary/Chest: Breath sounds normal. No respiratory distress. She has no wheezes.  Good breath sounds bilaterally.    Abdominal: Soft. Bowel sounds are normal. There is no tenderness.  Musculoskeletal: She exhibits no edema or tenderness.  Lymphadenopathy:    She has no cervical adenopathy.  Skin: No rash noted. No erythema.  Psychiatric: She has a normal mood and affect. Her behavior is normal.    BP 126/84 (BP Location: Right Arm, Patient Position: Sitting, Cuff Size: Normal)   Pulse 80   Temp 97.6  F (36.4 C) (Oral)   Wt 189 lb 4 oz (85.8 kg)   SpO2 98%   BMI 33.52 kg/m  Wt Readings from Last 3 Encounters:  03/13/16 189 lb 4 oz (85.8 kg)  03/10/16 187 lb (84.8 kg)  02/23/16 189 lb 2.5 oz (85.8 kg)     Lab Results  Component Value Date   WBC 9.1 03/10/2016   HGB 12.5 03/10/2016   HCT 36.6 03/10/2016   PLT 223 03/10/2016   GLUCOSE 105 (H) 03/10/2016   CHOL 225 (H) 01/30/2016   TRIG 118.0 01/30/2016   HDL 59.00 01/30/2016   LDLDIRECT 163.4 12/31/2012   LDLCALC 142 (H) 01/30/2016   ALT 16 02/23/2016   AST 20 02/23/2016   NA 139 03/10/2016   K 4.2 03/10/2016   CL 106 03/10/2016   CREATININE 1.31 (H) 03/10/2016   BUN 24 (H) 03/10/2016   CO2 27 03/10/2016   TSH 3.393 05/04/2015   INR 1.0 12/12/2012   HGBA1C 6.0 01/30/2016    Dg Chest 2 View  Result Date: 03/10/2016 CLINICAL DATA:  65 year old female with new onset of midsternal chest pain since yesterday. EXAM: CHEST  2 VIEW COMPARISON:  Multiple priors, most recently chest x-ray 11/07/2015. FINDINGS: Postoperative changes of right upper lobectomy. Lung volumes are normal. No consolidative airspace disease. No pleural effusions. No pneumothorax. No pulmonary nodule or mass noted. Pulmonary vasculature and the cardiomediastinal silhouette are within normal limits. Surgical clips project over the right upper quadrant of the abdomen, compatible with prior history of cholecystectomy. IMPRESSION: 1.  No radiographic  evidence of acute cardiopulmonary disease. 2. Status post right upper lobectomy. Electronically Signed   By: Vinnie Langton M.D.   On: 03/10/2016 12:58   Ct Angio Chest Pe W And/or Wo Contrast  Result Date: 03/10/2016 CLINICAL DATA:  65 year old female with history of chest pain since yesterday evening. Pain is worse during breathing. Shortness of breath. EXAM: CT ANGIOGRAPHY CHEST WITH CONTRAST TECHNIQUE: Multidetector CT imaging of the chest was performed using the standard protocol during bolus administration of intravenous contrast. Multiplanar CT image reconstructions and MIPs were obtained to evaluate the vascular anatomy. CONTRAST:  60 mL of Isovue 370. COMPARISON:  No priors. FINDINGS: Cardiovascular: No filling defects within the pulmonary arterial tree to suggest underlying pulmonary embolism. Heart size is mildly enlarged. There is no significant pericardial fluid, thickening or pericardial calcification. Mediastinum/Nodes: No pathologically enlarged mediastinal or hilar lymph nodes. Esophagus is unremarkable in appearance. No axillary lymphadenopathy. Lungs/Pleura: Status post right upper lobectomy. No suspicious appearing pulmonary nodule or mass. No acute consolidative airspace disease. No pleural effusions. Upper Abdomen: Status post cholecystectomy. Musculoskeletal: There are no aggressive appearing lytic or blastic lesions noted in the visualized portions of the skeleton. Review of the MIP images confirms the above findings. IMPRESSION: 1. No evidence of pulmonary embolism. 2. No acute findings in the thorax to account for the patient's symptoms. 3. Status post right upper lobectomy. Electronically Signed   By: Vinnie Langton M.D.   On: 03/10/2016 16:03       Assessment & Plan:   Problem List Items Addressed This Visit    Chest pain at rest    Chest pain as outlined.  W/up in ER as outlined.  Is better.  Still with some sob and some intermittent pain.  Will have cardiology evaluate  to confirm no further w/up warranted.  Follow.       Relevant Orders   Ambulatory referral to Cardiology   CKD (chronic kidney  disease), stage III    Follow metabolic panel.       Depression, major, recurrent ([place])    Stable.  Has xanax if needed.  Follow.  Using trazodone to help her sleep.       GERD (gastroesophageal reflux disease)    On omeprazole.  Appears to be controlled.        Hypercholesterolemia    Low cholesterol diet and exercise.  Should be back on colestid.  Follow cholesterol levels.  Unable to take statin medication.       Hyperglycemia    Low carb diet and exercise.  Follow met b and a1c.       Hypertension    Blood pressure under good control.  Continue same medication regimen.  Follow pressures.  Follow metabolic panel.         Other Visit Diagnoses   None.      Einar Pheasant, MD

## 2016-03-15 ENCOUNTER — Telehealth: Payer: Self-pay | Admitting: Internal Medicine

## 2016-03-15 NOTE — Telephone Encounter (Signed)
Left message to return call 

## 2016-03-15 NOTE — Telephone Encounter (Signed)
PT called and stated that she forgot to mention that she needs medication for the osteoporosis. Both side of of her ribs and waist are bothering her.  Pt also stated that she did not take the last 2 prednisone pills because her bp was high at about 160/110. Do you want her to finished off that medication?  Please advise, thank you!  Call pt @ [phone]

## 2016-03-15 NOTE — Telephone Encounter (Signed)
Please advise 

## 2016-03-15 NOTE — Telephone Encounter (Signed)
Please confirm with pt that her blood pressure has come down.  Her blood pressure was fine when she was here.  Regarding medication for osteoporosis, would hold for now until acute issues sorted through.  We can discuss treatment at her next appt.  I do not think her pain is coming from the osteoporosis.

## 2016-03-16 NOTE — Telephone Encounter (Signed)
Patient notified

## 2016-03-25 ENCOUNTER — Encounter: Payer: Self-pay | Admitting: Internal Medicine

## 2016-03-25 NOTE — Assessment & Plan Note (Signed)
Chest pain as outlined.  W/up in ER as outlined.  Is better.  Still with some sob and some intermittent pain.  Will have cardiology evaluate to confirm no further w/up warranted.  Follow.

## 2016-03-25 NOTE — Assessment & Plan Note (Signed)
Low carb diet and exercise.  Follow met b and a1c.  

## 2016-03-25 NOTE — Assessment & Plan Note (Signed)
Low cholesterol diet and exercise.  Should be back on colestid.  Follow cholesterol levels.  Unable to take statin medication.

## 2016-03-25 NOTE — Assessment & Plan Note (Signed)
Blood pressure under good control.  Continue same medication regimen.  Follow pressures.  Follow metabolic panel.   

## 2016-03-25 NOTE — Assessment & Plan Note (Signed)
Follow metabolic panel.  

## 2016-03-25 NOTE — Assessment & Plan Note (Signed)
On omeprazole.  Appears to be controlled.

## 2016-03-25 NOTE — Assessment & Plan Note (Signed)
Stable.  Has xanax if needed.  Follow.  Using trazodone to help her sleep.

## 2016-03-26 ENCOUNTER — Ambulatory Visit (INDEPENDENT_AMBULATORY_CARE_PROVIDER_SITE_OTHER): Payer: Commercial Managed Care - HMO | Admitting: Cardiovascular Disease

## 2016-03-26 ENCOUNTER — Encounter: Payer: Self-pay | Admitting: Cardiovascular Disease

## 2016-03-26 VITALS — BP 122/74 | HR 76 | Ht 63.0 in | Wt 187.0 lb

## 2016-03-26 DIAGNOSIS — R0789 Other chest pain: Secondary | ICD-10-CM | POA: Diagnosis not present

## 2016-03-26 DIAGNOSIS — I10 Essential (primary) hypertension: Secondary | ICD-10-CM

## 2016-03-26 DIAGNOSIS — R002 Palpitations: Secondary | ICD-10-CM | POA: Diagnosis not present

## 2016-03-26 NOTE — Progress Notes (Signed)
Cardiology Office Note   Date:  03/26/2016   ID:  Victoria Hanson, DOB Apr 20, 1951, MRN [id number]  PCP:  Einar Pheasant, MD  Cardiologist:   Kathlyn Sacramento, MD   Chief Complaint  Patient presents with  . other    Follow up from [facility] ER; chest pain. Meds reviewed by the pt. verbally. "doing well."       History of Present Illness: Victoria Hanson is a 65 y.o. female who presents for a follow-up visit regarding atypical chest pain and palpitations. She has multiple chronic medical conditions that include hypertension, hyperlipidemia, family history of heart disease and obesity. She also has history of colon cancer with metastasis to the lungs and is status post partial colectomy, right-sided lobectomy and chemotherapy.  She was evaluated in January for atypical chest pain. She had an echocardiogram done which showed normal LV systolic function with mild biatrial enlargement and mild pulmonary hypertension. A pharmacologic nuclear stress test showed no evidence of ischemia.  She was seen in July for palpitations after presentation to the emergency room. EKG did not show evidence of arrhythmia. She had a Holter monitor done which showed rare PACs and PVCs.  She went to the emergency room on September 30 with substernal chest pain with associated palpitations d-dimer was elevated. CTA of the chest showed no evidence of pulmonary embolism. It was felt that her symptoms were pleuritic. She reports COPD due to passive smoking exposure. She was treated with prednisone at that time with resolution of symptoms. She reports no recurrent episodes. She complains of palpitations only when she is anxious and stressed.   Past Medical History:  Diagnosis Date  . Anxiety   . Atypical chest pain    a. 2003 Cath: reportedly nl per pt [name]);  b. 04/2015 [place] Admission, neg troponin.  . Colon cancer ([place])    a. 1999: Pt had partial colectomy. Did develop lung metastasis. Had right upper lobectomy  in 01/2007 then had associated chemotherapy.  Marland Kitchen COPD (chronic obstructive pulmonary disease) ([place])   . DDD (degenerative disc disease), lumbosacral   . Depression   . Echocardiogram abnormal    a. 02/2010 Echo: EF 55-60%, mild LAE, no regional wall motion abnormalities  . Elevated transaminase level    a. ? NASH;  b. 06/2015 - nl LFTs.  . GERD (gastroesophageal reflux disease)   . Hematuria   . Holter monitor, abnormal    a. 12/2009 Holter monitoring: NSR, rare PACs and PVCs.  Marland Kitchen Hypercholesterolemia   . Hyperlipidemia   . Hypertension   . Leaky heart valve    a. Per pt report - no evidence of valvular abnormalities on prior echoes.  . Lung cancer ([place])    a. S/P resection (lobectomy - right)  . Morbid obesity ([place])   . Nephrolithiasis     Past Surgical History:  Procedure Laterality Date  . BREAST BIOPSY  10/07/2009   Right breast  . CARDIAC CATHETERIZATION  2003   negative as per pt report  . CHOLECYSTECTOMY  2000  . COLON SURGERY     Colon cancer, removed part of large colon  . COLONOSCOPY N/A 04/25/2015   Procedure: COLONOSCOPY;  Surgeon: Manya Silvas, MD;  Location: [facility] ENDOSCOPY;  Service: Endoscopy;  Laterality: N/A;  . LOBECTOMY     Right lung, upper lobe right side removed  . TOTAL ABDOMINAL HYSTERECTOMY     abnormal bleeding  . TUBAL LIGATION  1980     Current Outpatient Prescriptions  Medication Sig Dispense Refill  . albuterol (PROVENTIL HFA;VENTOLIN HFA) 108 (90 Base) MCG/ACT inhaler Inhale 2 puffs into the lungs every 6 (six) hours as needed for wheezing. 1 Inhaler 2  . ALPRAZolam (XANAX) 0.5 MG tablet Take 1 tablet (0.5 mg total) by mouth daily as needed for anxiety. 60 tablet 0  . aspirin EC 81 MG tablet Take 81 mg by mouth daily.    . Multiple Vitamin (MULTIVITAMIN) tablet Take 1 tablet by mouth daily.    Marland Kitchen omeprazole (PRILOSEC) 20 MG capsule Take 20 mg by mouth daily.    . potassium chloride SA (K-DUR,KLOR-CON) 20 MEQ tablet Take 1 tablet (20 mEq  total) by mouth 2 (two) times daily. 60 tablet 2  . traZODone (DESYREL) 50 MG tablet Take 1.5 tablets (75 mg total) by mouth at bedtime. 45 tablet 2  . triamterene-hydrochlorothiazide (MAXZIDE-25) 37.5-25 MG tablet Take 0.5 tablets by mouth daily. 30 tablet 1   No current facility-administered medications for this visit.     Allergies:   Macrobid [nitrofurantoin macrocrystal]; Antihistamines, diphenhydramine-type; Aspirin; Ceftin [cefuroxime axetil]; Celecoxib; Cymbalta [duloxetine hcl]; Diphenhydramine; Escitalopram; Lexapro [escitalopram oxalate]; Metronidazole; Zoloft [sertraline hcl]; and Ciprofloxacin    Social History:  The patient  reports that she is a non-smoker but has been exposed to tobacco smoke. She has never used smokeless tobacco. She reports that she drinks about 0.6 oz of alcohol per week . She reports that she does not use drugs.   Family History:  The patient's family history includes Cancer in her father; Coronary artery disease in her mother; Diabetes in her father and mother; Heart attack in her brother; Heart attack (age of onset: 61) in her father; Hypertension in her father; Mental illness in her brother and mother; Sudden death in her brother.    ROS:  Please see the history of present illness.   Otherwise, review of systems are positive for none.   All other systems are reviewed and negative.    PHYSICAL EXAM: VS:  BP 122/74 (BP Location: Left Arm, Patient Position: Sitting, Cuff Size: Normal)   Pulse 76   Ht '5\' 3"'$  (1.6 m)   Wt 187 lb (84.8 kg)   BMI 33.13 kg/m  , BMI Body mass index is 33.13 kg/m. GEN: Well nourished, well developed, in no acute distress  HEENT: normal  Neck: no JVD, carotid bruits, or masses Cardiac: RRR; no murmurs, rubs, or gallops,no edema  Respiratory:  clear to auscultation bilaterally, normal work of breathing GI: soft, nontender, nondistended, + BS MS: no deformity or atrophy  Skin: warm and dry, no rash Neuro:  Strength and  sensation are intact Psych: euthymic mood, full affect   EKG:  EKG is ordered today. The ekg ordered today demonstrates normal sinus rhythm with nonspecific T wave changes.   Recent Labs: 05/04/2015: TSH 3.393 11/07/2015: Magnesium 2.0 02/23/2016: ALT 16 03/10/2016: BUN 24; Creatinine, Ser 1.31; Hemoglobin 12.5; Platelets 223; Potassium 4.2; Sodium 139    Lipid Panel    Component Value Date/Time   CHOL 225 (H) 01/30/2016 0919   TRIG 118.0 01/30/2016 0919   HDL 59.00 01/30/2016 0919   CHOLHDL 4 01/30/2016 0919   VLDL 23.6 01/30/2016 0919   LDLCALC 142 (H) 01/30/2016 0919   LDLDIRECT 163.4 12/31/2012 1022      Wt Readings from Last 3 Encounters:  03/26/16 187 lb (84.8 kg)  03/13/16 189 lb 4 oz (85.8 kg)  03/10/16 187 lb (84.8 kg)       ASSESSMENT AND PLAN:  1.  Palpitations:  Due to mild PACs and PVCs with no evidence of other arrhythmia. Her symptoms seem to be triggered by anxiety.  2. Atypical chest pain:  The episode in September was pleuritic in nature. She reports improvement after treatment with prednisone. The patient reports COPD and wants to be referred to pulmonary for evaluation. She is going to discuss this with Dr. Nicki Reaper.  3. Essential hypertension: Blood pressure is controlled on current medications.  4. Hyperlipidemia: Currently on colestipol .  Disposition:   FU with me as needed  Signed,  Kathlyn Sacramento, MD  03/26/2016 11:04 AM    [place]

## 2016-03-26 NOTE — Patient Instructions (Signed)
Medication Instructions: Continue same medications.   Labwork: None.   Procedures/Testing: None.   Follow-Up: As needed with Dr. Arida.   Any Additional Special Instructions Will Be Listed Below (If Applicable).     If you need a refill on your cardiac medications before your next appointment, please call your pharmacy.   

## 2016-04-03 ENCOUNTER — Ambulatory Visit (INDEPENDENT_AMBULATORY_CARE_PROVIDER_SITE_OTHER): Payer: Commercial Managed Care - HMO | Admitting: Internal Medicine

## 2016-04-03 ENCOUNTER — Encounter: Payer: Self-pay | Admitting: Internal Medicine

## 2016-04-03 ENCOUNTER — Ambulatory Visit (INDEPENDENT_AMBULATORY_CARE_PROVIDER_SITE_OTHER): Payer: Commercial Managed Care - HMO

## 2016-04-03 VITALS — BP 120/78 | HR 77 | Temp 97.9°F | Resp 18 | Wt 190.5 lb

## 2016-04-03 DIAGNOSIS — R0789 Other chest pain: Secondary | ICD-10-CM

## 2016-04-03 DIAGNOSIS — R928 Other abnormal and inconclusive findings on diagnostic imaging of breast: Secondary | ICD-10-CM

## 2016-04-03 DIAGNOSIS — N183 Chronic kidney disease, stage 3 unspecified: Secondary | ICD-10-CM

## 2016-04-03 DIAGNOSIS — R739 Hyperglycemia, unspecified: Secondary | ICD-10-CM

## 2016-04-03 DIAGNOSIS — M7989 Other specified soft tissue disorders: Secondary | ICD-10-CM

## 2016-04-03 DIAGNOSIS — I10 Essential (primary) hypertension: Secondary | ICD-10-CM | POA: Diagnosis not present

## 2016-04-03 DIAGNOSIS — K219 Gastro-esophageal reflux disease without esophagitis: Secondary | ICD-10-CM

## 2016-04-03 DIAGNOSIS — M79674 Pain in right toe(s): Secondary | ICD-10-CM

## 2016-04-03 DIAGNOSIS — F419 Anxiety disorder, unspecified: Secondary | ICD-10-CM

## 2016-04-03 DIAGNOSIS — E7800 Pure hypercholesterolemia, unspecified: Secondary | ICD-10-CM

## 2016-04-03 DIAGNOSIS — M810 Age-related osteoporosis without current pathological fracture: Secondary | ICD-10-CM

## 2016-04-03 MED ORDER — ALENDRONATE SODIUM 70 MG PO TABS: 70.0000 mg | ORAL_TABLET | ORAL | 11 refills | Status: DC

## 2016-04-03 NOTE — Progress Notes (Signed)
Patient ID: Victoria Hanson, female   DOB: Feb 06, 1951, 65 y.o.   MRN: [id number]   Subjective:    Patient ID: Victoria Hanson, female    DOB: 10-Mar-1951, 65 y.o.   MRN: [id number]  HPI  Patient here for a scheduled follow up.  The chest pain has resolved.  Just saw cardiology.  Mentioned to them concern regarding history of COPD.  See note.  Breathing overall appears to be stable.  No acid reflux.  No abdominal pain.  Bowels overall stable.  Discussed recent bone density results.  Discussed treatment options.  Her acid reflux is controlled on current regimen.  No history of ulcers.     Past Medical History:  Diagnosis Date  . Anxiety   . Atypical chest pain    a. 2003 Cath: reportedly nl per pt [name]);  b. 04/2015 [place] Admission, neg troponin.  . Colon cancer ([place])    a. 1999: Pt had partial colectomy. Did develop lung metastasis. Had right upper lobectomy in 01/2007 then had associated chemotherapy.  Marland Kitchen COPD (chronic obstructive pulmonary disease) ([place])   . DDD (degenerative disc disease), lumbosacral   . Depression   . Echocardiogram abnormal    a. 02/2010 Echo: EF 55-60%, mild LAE, no regional wall motion abnormalities  . Elevated transaminase level    a. ? NASH;  b. 06/2015 - nl LFTs.  . GERD (gastroesophageal reflux disease)   . Hematuria   . Holter monitor, abnormal    a. 12/2009 Holter monitoring: NSR, rare PACs and PVCs.  Marland Kitchen Hypercholesterolemia   . Hyperlipidemia   . Hypertension   . Leaky heart valve    a. Per pt report - no evidence of valvular abnormalities on prior echoes.  . Lung cancer ([place])    a. S/P resection (lobectomy - right)  . Morbid obesity ([place])   . Nephrolithiasis    Past Surgical History:  Procedure Laterality Date  . BREAST BIOPSY  10/07/2009   Right breast  . CARDIAC CATHETERIZATION  2003   negative as per pt report  . CHOLECYSTECTOMY  2000  . COLON SURGERY     Colon cancer, removed part of large colon  . COLONOSCOPY N/A 04/25/2015   Procedure:  COLONOSCOPY;  Surgeon: Manya Silvas, MD;  Location: [facility] ENDOSCOPY;  Service: Endoscopy;  Laterality: N/A;  . LOBECTOMY     Right lung, upper lobe right side removed  . TOTAL ABDOMINAL HYSTERECTOMY     abnormal bleeding  . TUBAL LIGATION  1980   Family History  Problem Relation Age of Onset  . Coronary artery disease Mother     died @ 13 of MI.  . Mental illness Mother   . Diabetes Mother   . Heart attack Father 6    MI  . Cancer Father     lung - died in his mid-70's  . Hypertension Father   . Diabetes Father   . Sudden death Brother   . Mental illness Brother   . Heart attack Brother     MI @ 46, died @ age 75.  . Breast cancer Neg Hx    Social History   Social History  . Marital status: Widowed    Spouse name: N/A  . Number of children: N/A  . Years of education: N/A   Social History Main Topics  . Smoking status: Passive Smoke Exposure - Never Smoker  . Smokeless tobacco: Never Used     Comment: husband and sons smoked in her home.  Marland Kitchen  Alcohol use 0.6 oz/week    1 Glasses of wine per week     Comment: rare wine cooler.  . Drug use: No  . Sexual activity: Not Asked   Other Topics Concern  . None   Social History Narrative   Widowed.   Disabled (colon/lung CA), retired.   Lives in [place] with her dtr, grand-dtr, and son.   Active.   Gets regular exercise, walks.    Review of Systems  Constitutional: Negative for appetite change and unexpected weight change.  HENT: Negative for congestion and sinus pressure.   Respiratory: Negative for cough, chest tightness and shortness of breath.   Cardiovascular: Negative for chest pain, palpitations and leg swelling.  Gastrointestinal: Negative for abdominal pain, diarrhea, nausea and vomiting.  Genitourinary: Negative for difficulty urinating and dysuria.  Musculoskeletal: Negative for back pain and joint swelling.       Does report some pain in her right little toe.  No known injury.  Was questioning  gout.    Skin: Negative for color change and rash.  Neurological: Negative for dizziness, light-headedness and headaches.  Psychiatric/Behavioral: Negative for agitation and dysphoric mood.       Objective:    Physical Exam  Constitutional: She appears well-developed and well-nourished. No distress.  HENT:  Nose: Nose normal.  Mouth/Throat: Oropharynx is clear and moist.  Neck: Neck supple. No thyromegaly present.  Cardiovascular: Normal rate and regular rhythm.   Pulmonary/Chest: Breath sounds normal. No respiratory distress. She has no wheezes.  Abdominal: Soft. Bowel sounds are normal. There is no tenderness.  Musculoskeletal: She exhibits no edema or tenderness.  Increased pain - right fifth toe.  No increased erythema.    Lymphadenopathy:    She has no cervical adenopathy.  Skin: No rash noted. No erythema.  Psychiatric: She has a normal mood and affect. Her behavior is normal.    BP 120/78 (BP Location: Left Arm, Patient Position: Sitting, Cuff Size: Normal)   Pulse 77   Temp 97.9 F (36.6 C) (Oral)   Resp 18   Wt 190 lb 8 oz (86.4 kg)   SpO2 98%   BMI 33.75 kg/m  Wt Readings from Last 3 Encounters:  04/03/16 190 lb 8 oz (86.4 kg)  03/26/16 187 lb (84.8 kg)  03/13/16 189 lb 4 oz (85.8 kg)     Lab Results  Component Value Date   WBC 9.1 03/10/2016   HGB 12.5 03/10/2016   HCT 36.6 03/10/2016   PLT 223 03/10/2016   GLUCOSE 105 (H) 03/10/2016   CHOL 225 (H) 01/30/2016   TRIG 118.0 01/30/2016   HDL 59.00 01/30/2016   LDLDIRECT 163.4 12/31/2012   LDLCALC 142 (H) 01/30/2016   ALT 16 02/23/2016   AST 20 02/23/2016   NA 139 03/10/2016   K 4.2 03/10/2016   CL 106 03/10/2016   CREATININE 1.31 (H) 03/10/2016   BUN 24 (H) 03/10/2016   CO2 27 03/10/2016   TSH 3.393 05/04/2015   INR 1.0 12/12/2012   HGBA1C 6.0 01/30/2016    Dg Chest 2 View  Result Date: 03/10/2016 CLINICAL DATA:  65 year old female with new onset of midsternal chest pain since yesterday.  EXAM: CHEST  2 VIEW COMPARISON:  Multiple priors, most recently chest x-ray 11/07/2015. FINDINGS: Postoperative changes of right upper lobectomy. Lung volumes are normal. No consolidative airspace disease. No pleural effusions. No pneumothorax. No pulmonary nodule or mass noted. Pulmonary vasculature and the cardiomediastinal silhouette are within normal limits. Surgical clips project over  the right upper quadrant of the abdomen, compatible with prior history of cholecystectomy. IMPRESSION: 1.  No radiographic evidence of acute cardiopulmonary disease. 2. Status post right upper lobectomy. Electronically Signed   By: Vinnie Langton M.D.   On: 03/10/2016 12:58   Ct Angio Chest Pe W And/or Wo Contrast  Result Date: 03/10/2016 CLINICAL DATA:  65 year old female with history of chest pain since yesterday evening. Pain is worse during breathing. Shortness of breath. EXAM: CT ANGIOGRAPHY CHEST WITH CONTRAST TECHNIQUE: Multidetector CT imaging of the chest was performed using the standard protocol during bolus administration of intravenous contrast. Multiplanar CT image reconstructions and MIPs were obtained to evaluate the vascular anatomy. CONTRAST:  60 mL of Isovue 370. COMPARISON:  No priors. FINDINGS: Cardiovascular: No filling defects within the pulmonary arterial tree to suggest underlying pulmonary embolism. Heart size is mildly enlarged. There is no significant pericardial fluid, thickening or pericardial calcification. Mediastinum/Nodes: No pathologically enlarged mediastinal or hilar lymph nodes. Esophagus is unremarkable in appearance. No axillary lymphadenopathy. Lungs/Pleura: Status post right upper lobectomy. No suspicious appearing pulmonary nodule or mass. No acute consolidative airspace disease. No pleural effusions. Upper Abdomen: Status post cholecystectomy. Musculoskeletal: There are no aggressive appearing lytic or blastic lesions noted in the visualized portions of the skeleton. Review of the  MIP images confirms the above findings. IMPRESSION: 1. No evidence of pulmonary embolism. 2. No acute findings in the thorax to account for the patient's symptoms. 3. Status post right upper lobectomy. Electronically Signed   By: Vinnie Langton M.D.   On: 03/10/2016 16:03       Assessment & Plan:   Problem List Items Addressed This Visit    Abnormal mammogram    12/07/15 - Birads I.       Anxiety, mild    Uses xanax prn.        Atypical chest pain    Just saw cardiology.  Felt no further cardiac w/up warranted.        CKD (chronic kidney disease), stage III    Creatinine and GFR stable.  Follow.  Will have to follow on fosamax.        GERD (gastroesophageal reflux disease)    Controlled on omeprazole.        Hypercholesterolemia    Low cholesterol diet and exercise.  Follow.  Unable to tolerate statin medication.        Hyperglycemia    Low carb diet and exercise.  Follow.        Hypertension    Blood pressure under good control.  Continue same medication regimen.  Follow pressures.  Follow metabolic panel.        Osteoporosis    Discussed bone density results.  Discussed treatment options.  Monitor kidney function.  Start fosamax.  Follow.  Weight bearing exercise.        Relevant Medications   alendronate (FOSAMAX) 70 MG tablet   Swelling of lower extremity    No increased swelling noted on exam.  Follow.         Other Visit Diagnoses    Toe pain, right    -  Primary   persistent pain.  xray.  further w/up pending results.     Relevant Orders   DG Toe 5th Right (Completed)       Einar Pheasant, MD

## 2016-04-03 NOTE — Progress Notes (Signed)
Pre visit review using our clinic review tool, if applicable. No additional management support is needed unless otherwise documented below in the visit note. 

## 2016-04-04 ENCOUNTER — Telehealth: Payer: Self-pay | Admitting: Internal Medicine

## 2016-04-04 ENCOUNTER — Encounter: Payer: Self-pay | Admitting: Internal Medicine

## 2016-04-04 DIAGNOSIS — Z79899 Other long term (current) drug therapy: Secondary | ICD-10-CM | POA: Diagnosis not present

## 2016-04-04 DIAGNOSIS — J449 Chronic obstructive pulmonary disease, unspecified: Secondary | ICD-10-CM

## 2016-04-04 NOTE — Telephone Encounter (Signed)
Patient states its been a couple days but today it stated having blood in her mucous. Patient states she has tried nasal spray and saline flush. Patient will try robitussin and will give [place] a call back if it does not get better.

## 2016-04-04 NOTE — Telephone Encounter (Signed)
Pt called and stated that she saw Dr. Nicki Reaper yesterday and forgot to mention that she was having a lot of sinus pressure. Pt stated that today she has a lot of blood in her nose and her face really hurts, she was wondering if Dr. Nicki Reaper would just call in a prescription for her, or does she need to come back in for an appt. Please advise, thank you!  [place], [place]  Call pt @ [phone]

## 2016-04-04 NOTE — Telephone Encounter (Signed)
See additional note regarding this patient, she has been trying saline sprays and was going to try Robitussin requesting a referral, please advise, thanks

## 2016-04-04 NOTE — Telephone Encounter (Signed)
Pt saw Dr. Nicki Reaper yesterday. She forgot to ask for a referral to a pulmonary doctor yesterday. She wants to go over to the hospital office. ([place])

## 2016-04-04 NOTE — Telephone Encounter (Signed)
Please advise 

## 2016-04-04 NOTE — Telephone Encounter (Signed)
When did symptoms start?  Has she tried any medications to help or used any nasal sprays?  If symptoms just started and not using any medication, I recommend saline nasal spray - flush nose at least 2-3x/day, nasacort nasal spray - 2 sprays each nostril one time per day.  Do this in the evening.  Robitussin twice a day as needed.  If persistent symptoms, let us know and then may need something more.

## 2016-04-05 NOTE — Telephone Encounter (Signed)
COPD diagnosis, acts up and heavy breathing so she wanted to have a specialist look at it and follow her if possible  Dr. Fletcher Anon office has pulmonary (Dr. Jamal Collin and etc. ) in the office. She would like to go there.  It is in the hospital basement per the patient.  She doesn't want to go to [facility] [facility].

## 2016-04-05 NOTE — Telephone Encounter (Signed)
Need specific reason for referral.  If she wants referral, I do not mind placing the order, but just need more information.

## 2016-04-06 NOTE — Telephone Encounter (Signed)
Order placed for pulmonary referral.  

## 2016-04-08 ENCOUNTER — Encounter: Payer: Self-pay | Admitting: Internal Medicine

## 2016-04-08 DIAGNOSIS — M810 Age-related osteoporosis without current pathological fracture: Secondary | ICD-10-CM | POA: Insufficient documentation

## 2016-04-08 NOTE — Assessment & Plan Note (Signed)
Blood pressure under good control.  Continue same medication regimen.  Follow pressures.  Follow metabolic panel.   

## 2016-04-08 NOTE — Assessment & Plan Note (Signed)
Discussed bone density results.  Discussed treatment options.  Monitor kidney function.  Start fosamax.  Follow.  Weight bearing exercise.

## 2016-04-08 NOTE — Assessment & Plan Note (Signed)
Low cholesterol diet and exercise.  Follow.  Unable to tolerate statin medication.

## 2016-04-08 NOTE — Assessment & Plan Note (Signed)
Creatinine and GFR stable.  Follow.  Will have to follow on fosamax.

## 2016-04-08 NOTE — Assessment & Plan Note (Signed)
Controlled on omeprazole.   

## 2016-04-08 NOTE — Assessment & Plan Note (Signed)
No increased swelling noted on exam.  Follow.

## 2016-04-08 NOTE — Assessment & Plan Note (Signed)
Uses xanax prn.

## 2016-04-08 NOTE — Assessment & Plan Note (Signed)
12/07/15 - Birads I.

## 2016-04-08 NOTE — Assessment & Plan Note (Signed)
Low carb diet and exercise.  Follow.  

## 2016-04-08 NOTE — Assessment & Plan Note (Signed)
Just saw cardiology.  Felt no further cardiac w/up warranted.

## 2016-04-09 ENCOUNTER — Ambulatory Visit (INDEPENDENT_AMBULATORY_CARE_PROVIDER_SITE_OTHER): Payer: Self-pay | Admitting: Vascular Surgery

## 2016-04-09 ENCOUNTER — Encounter (INDEPENDENT_AMBULATORY_CARE_PROVIDER_SITE_OTHER): Payer: Self-pay

## 2016-04-17 ENCOUNTER — Other Ambulatory Visit: Payer: Self-pay | Admitting: Internal Medicine

## 2016-04-17 MED ORDER — ALPRAZOLAM 0.5 MG PO TABS: 0.5000 mg | ORAL_TABLET | Freq: Every day | ORAL | 0 refills | Status: DC | PRN

## 2016-04-17 NOTE — Telephone Encounter (Signed)
Pt called requesting a refill on her ALPRAZolam (XANAX) 0.5 MG tablet. Thank you!  [name], [name]  Call pt @ [phone]

## 2016-04-17 NOTE — Telephone Encounter (Signed)
Last filled 12/05/15 60 0 rf

## 2016-04-17 NOTE — Telephone Encounter (Signed)
faxed

## 2016-04-26 ENCOUNTER — Ambulatory Visit (INDEPENDENT_AMBULATORY_CARE_PROVIDER_SITE_OTHER): Payer: Commercial Managed Care - HMO | Admitting: Family

## 2016-04-26 ENCOUNTER — Encounter: Payer: Self-pay | Admitting: Family

## 2016-04-26 VITALS — BP 120/76 | HR 73 | Temp 98.3°F | Wt 189.0 lb

## 2016-04-26 DIAGNOSIS — R0981 Nasal congestion: Secondary | ICD-10-CM

## 2016-04-26 MED ORDER — GUAIFENESIN ER 600 MG PO TB12: 1200.0000 mg | ORAL_TABLET | Freq: Two times a day (BID) | ORAL | 3 refills | Status: DC

## 2016-04-26 MED ORDER — AMOXICILLIN-POT CLAVULANATE ER 1000-62.5 MG PO TB12: 2.0000 | ORAL_TABLET | Freq: Two times a day (BID) | ORAL | 0 refills | Status: DC

## 2016-04-26 NOTE — Progress Notes (Signed)
Pre visit review using our clinic review tool, if applicable. No additional management support is needed unless otherwise documented below in the visit note. 

## 2016-04-26 NOTE — Progress Notes (Signed)
Subjective:    Patient ID: Victoria Hanson, female    DOB: April 13, 1951, 65 y.o.   MRN: [id number]  CC: [name] is a 65 y.o. female who presents today for an acute visit.    HPI: CC: sinus congestion and cough  , worsening, started one week ago. Started with cough at first which has resolved and now started congestion. Endorses sinus pressure.  Describes blood in nasal mucous. Had been using nasonex, tylenol, and robitussin with some relief. On ASA 81 mg.    H/o colon cancer which metatasized to lung cancer, in remission for 8 years.      HISTORY:  Past Medical History:  Diagnosis Date  . Anxiety   . Atypical chest pain    a. 2003 Cath: reportedly nl per pt [name]);  b. 04/2015 [place] Admission, neg troponin.  . Colon cancer ([place])    a. 1999: Pt had partial colectomy. Did develop lung metastasis. Had right upper lobectomy in 01/2007 then had associated chemotherapy.  Marland Kitchen COPD (chronic obstructive pulmonary disease) ([place])   . DDD (degenerative disc disease), lumbosacral   . Depression   . Echocardiogram abnormal    a. 02/2010 Echo: EF 55-60%, mild LAE, no regional wall motion abnormalities  . Elevated transaminase level    a. ? NASH;  b. 06/2015 - nl LFTs.  . GERD (gastroesophageal reflux disease)   . Hematuria   . Holter monitor, abnormal    a. 12/2009 Holter monitoring: NSR, rare PACs and PVCs.  Marland Kitchen Hypercholesterolemia   . Hyperlipidemia   . Hypertension   . Leaky heart valve    a. Per pt report - no evidence of valvular abnormalities on prior echoes.  . Lung cancer ([place])    a. S/P resection (lobectomy - right)  . Morbid obesity ([place])   . Nephrolithiasis    Past Surgical History:  Procedure Laterality Date  . BREAST BIOPSY  10/07/2009   Right breast  . CARDIAC CATHETERIZATION  2003   negative as per pt report  . CHOLECYSTECTOMY  2000  . COLON SURGERY     Colon cancer, removed part of large colon  . COLONOSCOPY N/A 04/25/2015   Procedure: COLONOSCOPY;   Surgeon: Manya Silvas, MD;  Location: [facility] ENDOSCOPY;  Service: Endoscopy;  Laterality: N/A;  . LOBECTOMY     Right lung, upper lobe right side removed  . TOTAL ABDOMINAL HYSTERECTOMY     abnormal bleeding  . TUBAL LIGATION  1980   Family History  Problem Relation Age of Onset  . Coronary artery disease Mother     died @ 42 of MI.  . Mental illness Mother   . Diabetes Mother   . Heart attack Father 41    MI  . Cancer Father     lung - died in his mid-70's  . Hypertension Father   . Diabetes Father   . Sudden death Brother   . Mental illness Brother   . Heart attack Brother     MI @ 38, died @ age 50.  . Breast cancer Neg Hx     Allergies: Macrobid [nitrofurantoin macrocrystal]; Antihistamines, diphenhydramine-type; Aspirin; Ceftin [cefuroxime axetil]; Celecoxib; Cymbalta [duloxetine hcl]; Diphenhydramine; Escitalopram; Lexapro [escitalopram oxalate]; Metronidazole; Zoloft [sertraline hcl]; and Ciprofloxacin Current Outpatient Prescriptions on File Prior to Visit  Medication Sig Dispense Refill  . albuterol (PROVENTIL HFA;VENTOLIN HFA) 108 (90 Base) MCG/ACT inhaler Inhale 2 puffs into the lungs every 6 (six) hours as needed for wheezing. 1 Inhaler 2  .  alendronate (FOSAMAX) 70 MG tablet Take 1 tablet (70 mg total) by mouth every 7 (seven) days. Take with a full glass of water on an empty stomach. 4 tablet 11  . ALPRAZolam (XANAX) 0.5 MG tablet Take 1 tablet (0.5 mg total) by mouth daily as needed for anxiety. 60 tablet 0  . aspirin EC 81 MG tablet Take 81 mg by mouth daily.    . Multiple Vitamin (MULTIVITAMIN) tablet Take 1 tablet by mouth daily.    Marland Kitchen omeprazole (PRILOSEC) 20 MG capsule Take 20 mg by mouth daily.    . potassium chloride SA (K-DUR,KLOR-CON) 20 MEQ tablet Take 1 tablet (20 mEq total) by mouth 2 (two) times daily. 60 tablet 2  . traZODone (DESYREL) 50 MG tablet Take 1.5 tablets (75 mg total) by mouth at bedtime. 45 tablet 2  . triamterene-hydrochlorothiazide  (MAXZIDE-25) 37.5-25 MG tablet Take 0.5 tablets by mouth daily. 30 tablet 1   No current facility-administered medications on file prior to visit.     Social History  Substance Use Topics  . Smoking status: Passive Smoke Exposure - Never Smoker  . Smokeless tobacco: Never Used     Comment: husband and sons smoked in her home.  . Alcohol use 0.6 oz/week    1 Glasses of wine per week     Comment: rare wine cooler.    Review of Systems  Constitutional: Negative for chills and fever.  HENT: Positive for congestion, nosebleeds and sinus pressure. Negative for sore throat.   Respiratory: Negative for cough, shortness of breath and wheezing.   Cardiovascular: Negative for chest pain and palpitations.  Gastrointestinal: Negative for nausea and vomiting.  Neurological: Negative for headaches.      Objective:    BP 120/76   Pulse 73   Temp 98.3 F (36.8 C) (Oral)   Wt 189 lb (85.7 kg)   SpO2 95%   BMI 33.48 kg/m    Physical Exam  Constitutional: She appears well-developed and well-nourished.  HENT:  Head: Normocephalic and atraumatic.  Right Ear: Hearing, tympanic membrane, external ear and ear canal normal. No drainage, swelling or tenderness. No foreign bodies. Tympanic membrane is not erythematous and not bulging. No middle ear effusion. No decreased hearing is noted.  Left Ear: Hearing, tympanic membrane, external ear and ear canal normal. No drainage, swelling or tenderness. No foreign bodies. Tympanic membrane is not erythematous and not bulging.  No middle ear effusion. No decreased hearing is noted.  Nose: Nose normal. No rhinorrhea. No epistaxis. Right sinus exhibits no maxillary sinus tenderness and no frontal sinus tenderness. Left sinus exhibits no maxillary sinus tenderness and no frontal sinus tenderness.  Mouth/Throat: Uvula is midline, oropharynx is clear and moist and mucous membranes are normal. No oropharyngeal exudate, posterior oropharyngeal edema, posterior  oropharyngeal erythema or tonsillar abscesses.  Dry, cracking nasal mucosa. Scant dried blood.   Eyes: Conjunctivae are normal.  Cardiovascular: Regular rhythm, normal heart sounds and normal pulses.   Pulmonary/Chest: Effort normal and breath sounds normal. She has no wheezes. She has no rhonchi. She has no rales.  Lymphadenopathy:       Head (right side): No submental, no submandibular, no tonsillar, no preauricular, no posterior auricular and no occipital adenopathy present.       Head (left side): No submental, no submandibular, no tonsillar, no preauricular, no posterior auricular and no occipital adenopathy present.    She has no cervical adenopathy.  Neurological: She is alert.  Skin: Skin is warm and dry.  Psychiatric: She has a normal mood and affect. Her speech is normal and behavior is normal. Thought content normal.  Vitals reviewed.      Assessment & Plan:   1. Sinus congestion Afebrile. Cough has resolved. Advised patient to continue conservative management for one to 2 more days with Mucinex, Vaseline for nasal dryness.Advised to stop using nasonex and suspect exacerbating blood seen in mucous. If it does not resolve on its own, patient understands to fill antibiotic. Patient verbalized understanding.  - amoxicillin-clavulanate (AUGMENTIN XR) 1000-62.5 MG 12 hr tablet; Take 2 tablets by mouth 2 (two) times daily.  Dispense: 14 tablet; Refill: 0 - guaiFENesin (MUCINEX) 600 MG 12 hr tablet; Take 2 tablets (1,200 mg total) by mouth 2 (two) times daily.  Dispense: 28 tablet; Refill: 3    I am having Ms. Forrey maintain her multivitamin, omeprazole, aspirin EC, potassium chloride SA, traZODone, triamterene-hydrochlorothiazide, albuterol, alendronate, and ALPRAZolam.   No orders of the defined types were placed in this encounter.   Return precautions given.   Risks, benefits, and alternatives of the medications and treatment plan prescribed today were discussed, and patient  expressed understanding.   Education regarding symptom management and diagnosis given to patient on AVS.  Continue to follow with Einar Pheasant, MD for routine health maintenance.   [name] and I agreed with plan.   Mable Paris, FNP

## 2016-04-26 NOTE — Patient Instructions (Addendum)
I suspect that your infection is viral in nature.  As discussed, I advise that you wait to fill the antibiotic after 1-2 days of symptom management to see if your symptoms improve. If you do not show improvement, you may take the antibiotic as prescribed.   Increase intake of clear fluids. Congestion is bdsest treated by hydration, when mucus is wetter, it is thinner, less sticky, and easier to expel from the body, either through coughing up drainage, or by blowing your nose.   Get plenty of rest.   Use saline nasal drops and blow your nose frequently. Run a humidifier at night and elevate the head of the bed. Vicks Vapor rub will help with congestion and cough. Steam showers and sinus massage for congestion.   Use Acetaminophen or Ibuprofen as needed for fever or pain. Avoid second hand smoke. Even the smallest exposure will worsen symptoms.   Over the counter medications you can try include Delsym for cough, a decongestant for congestion, and Mucinex or Robitussin as an expectorant. Be sure to just get the plain Mucinex or Robitussin that just has one medication (Guaifenesen). We don't recommend the combination products. Note, be sure to drink two glasses of water with each dose of Mucinex as the medication will not work well without adequate hydration.   You can also try a teaspoon of honey to see if this will help reduce cough. Throat lozenges can sometimes be beneficial as well.    This illness will typically last 7 - 10 days.   Please follow up with our clinic if you develop a fever greater than 101 F, symptoms worsen, or do not resolve in the next week.

## 2016-05-09 ENCOUNTER — Other Ambulatory Visit (INDEPENDENT_AMBULATORY_CARE_PROVIDER_SITE_OTHER): Payer: Self-pay | Admitting: Vascular Surgery

## 2016-05-09 DIAGNOSIS — I6523 Occlusion and stenosis of bilateral carotid arteries: Secondary | ICD-10-CM

## 2016-05-15 ENCOUNTER — Ambulatory Visit: Payer: Medicare PPO | Admitting: Oncology

## 2016-05-15 ENCOUNTER — Other Ambulatory Visit: Payer: Medicare PPO

## 2016-05-15 ENCOUNTER — Ambulatory Visit: Payer: Medicare PPO | Admitting: Hematology and Oncology

## 2016-05-17 ENCOUNTER — Ambulatory Visit (INDEPENDENT_AMBULATORY_CARE_PROVIDER_SITE_OTHER): Payer: Self-pay | Admitting: Vascular Surgery

## 2016-05-17 ENCOUNTER — Encounter (INDEPENDENT_AMBULATORY_CARE_PROVIDER_SITE_OTHER): Payer: Commercial Managed Care - HMO

## 2016-05-17 ENCOUNTER — Institutional Professional Consult (permissible substitution): Payer: Commercial Managed Care - HMO | Admitting: Pulmonary Disease

## 2016-05-31 ENCOUNTER — Institutional Professional Consult (permissible substitution): Payer: Commercial Managed Care - HMO | Admitting: Pulmonary Disease

## 2016-06-01 ENCOUNTER — Ambulatory Visit (INDEPENDENT_AMBULATORY_CARE_PROVIDER_SITE_OTHER): Payer: Commercial Managed Care - HMO | Admitting: Internal Medicine

## 2016-06-01 ENCOUNTER — Encounter: Payer: Self-pay | Admitting: Internal Medicine

## 2016-06-01 VITALS — BP 132/72 | HR 80 | Temp 98.2°F | Ht 63.0 in | Wt 188.6 lb

## 2016-06-01 DIAGNOSIS — R251 Tremor, unspecified: Secondary | ICD-10-CM

## 2016-06-01 DIAGNOSIS — E7800 Pure hypercholesterolemia, unspecified: Secondary | ICD-10-CM | POA: Diagnosis not present

## 2016-06-01 DIAGNOSIS — N183 Chronic kidney disease, stage 3 unspecified: Secondary | ICD-10-CM

## 2016-06-01 DIAGNOSIS — K219 Gastro-esophageal reflux disease without esophagitis: Secondary | ICD-10-CM

## 2016-06-01 DIAGNOSIS — I10 Essential (primary) hypertension: Secondary | ICD-10-CM | POA: Diagnosis not present

## 2016-06-01 DIAGNOSIS — R739 Hyperglycemia, unspecified: Secondary | ICD-10-CM | POA: Diagnosis not present

## 2016-06-01 DIAGNOSIS — C189 Malignant neoplasm of colon, unspecified: Secondary | ICD-10-CM

## 2016-06-01 DIAGNOSIS — F329 Major depressive disorder, single episode, unspecified: Secondary | ICD-10-CM

## 2016-06-01 LAB — HEPATIC FUNCTION PANEL
ALBUMIN: 4 g/dL (ref 3.5–5.2)
ALT: 19 U/L (ref 0–35)
AST: 19 U/L (ref 0–37)
Alkaline Phosphatase: 82 U/L (ref 39–117)
Bilirubin, Direct: 0.1 mg/dL (ref 0.0–0.3)
Total Bilirubin: 0.4 mg/dL (ref 0.2–1.2)
Total Protein: 6.9 g/dL (ref 6.0–8.3)

## 2016-06-01 LAB — LIPID PANEL
CHOL/HDL RATIO: 4
Cholesterol: 233 mg/dL — ABNORMAL HIGH (ref 0–200)
HDL: 60 mg/dL (ref >39.00)
LDL CALC: 148 mg/dL — AB (ref 0–99)
NONHDL: 173.28
Triglycerides: 126 mg/dL (ref 0.0–149.0)
VLDL: 25.2 mg/dL (ref 0.0–40.0)

## 2016-06-01 LAB — BASIC METABOLIC PANEL
BUN: 20 mg/dL (ref 6–23)
CHLORIDE: 106 meq/L (ref 96–112)
CO2: 29 meq/L (ref 19–32)
Calcium: 9.7 mg/dL (ref 8.4–10.5)
Creatinine, Ser: 1.11 mg/dL (ref 0.40–1.20)
GFR: 52.18 mL/min — ABNORMAL LOW (ref >60.00)
Glucose, Bld: 102 mg/dL — ABNORMAL HIGH (ref 70–99)
POTASSIUM: 4.7 meq/L (ref 3.5–5.1)
SODIUM: 142 meq/L (ref 135–145)

## 2016-06-01 LAB — HEMOGLOBIN A1C: Hgb A1c MFr Bld: 6 % (ref 4.6–6.5)

## 2016-06-01 LAB — TSH: TSH: 1.51 u[IU]/mL (ref 0.35–4.50)

## 2016-06-01 NOTE — Progress Notes (Signed)
Pre visit review using our clinic review tool, if applicable. No additional management support is needed unless otherwise documented below in the visit note. 

## 2016-06-01 NOTE — Progress Notes (Signed)
Patient ID: Victoria Hanson, female   DOB: 1951-04-29, 65 y.o.   MRN: [id number]   Subjective:    Patient ID: Victoria Hanson, female    DOB: 12-Mar-1951, 65 y.o.   MRN: [id number]  HPI  Patient here for a scheduled follow up.  She reports increased stress with family issues.  Discussed with her today.  Discussed psychiatry referral.  She wants to hold at this time.  Is taking xanax.  Takes prn.  Has been taking more on a daily basis recently.  Discussed with her.  She has tried other SSRIs.  Had some intolerance.  Overall she feels she is handling things relatively well.  No chest pain.  Breathing stable.  Eating.  She reports that she will noticed some hand shaking intermittently.  Also has noticed some shuffling with her walking.  She request neurology evaluation.  Blood pressure has been doing well.    Past Medical History:  Diagnosis Date  . Anxiety   . Atypical chest pain    a. 2003 Cath: reportedly nl per pt [name]);  b. 04/2015 [facility] Admission, neg troponin.  . Colon cancer ([place])    a. 1999: Pt had partial colectomy. Did develop lung metastasis. Had right upper lobectomy in 01/2007 then had associated chemotherapy.  Marland Kitchen COPD (chronic obstructive pulmonary disease) ([place])   . DDD (degenerative disc disease), lumbosacral   . Depression   . Echocardiogram abnormal    a. 02/2010 Echo: EF 55-60%, mild LAE, no regional wall motion abnormalities  . Elevated transaminase level    a. ? NASH;  b. 06/2015 - nl LFTs.  . GERD (gastroesophageal reflux disease)   . Hematuria   . Holter monitor, abnormal    a. 12/2009 Holter monitoring: NSR, rare PACs and PVCs.  Marland Kitchen Hypercholesterolemia   . Hyperlipidemia   . Hypertension   . Leaky heart valve    a. Per pt report - no evidence of valvular abnormalities on prior echoes.  . Lung cancer ([place])    a. S/P resection (lobectomy - right)  . Morbid obesity ([place])   . Nephrolithiasis    Past Surgical History:  Procedure Laterality Date  . BREAST BIOPSY   10/07/2009   Right breast  . CARDIAC CATHETERIZATION  2003   negative as per pt report  . CHOLECYSTECTOMY  2000  . COLON SURGERY     Colon cancer, removed part of large colon  . COLONOSCOPY N/A 04/25/2015   Procedure: COLONOSCOPY;  Surgeon: Manya Silvas, MD;  Location: [facility] ENDOSCOPY;  Service: Endoscopy;  Laterality: N/A;  . LOBECTOMY     Right lung, upper lobe right side removed  . TOTAL ABDOMINAL HYSTERECTOMY     abnormal bleeding  . TUBAL LIGATION  1980   Family History  Problem Relation Age of Onset  . Coronary artery disease Mother     died @ 50 of MI.  . Mental illness Mother   . Diabetes Mother   . Heart attack Father 17    MI  . Cancer Father     lung - died in his mid-70's  . Hypertension Father   . Diabetes Father   . Sudden death Brother   . Mental illness Brother   . Heart attack Brother     MI @ 21, died @ age 20.  . Breast cancer Neg Hx    Social History   Social History  . Marital status: Widowed    Spouse name: N/A  . Number of children:  N/A  . Years of education: N/A   Social History Main Topics  . Smoking status: Passive Smoke Exposure - Never Smoker  . Smokeless tobacco: Never Used     Comment: husband and sons smoked in her home.  . Alcohol use 0.6 oz/week    1 Glasses of wine per week     Comment: rare wine cooler.  . Drug use: No  . Sexual activity: Not Asked   Other Topics Concern  . None   Social History Narrative   Widowed.   Disabled (colon/lung CA), retired.   Lives in [place] with her dtr, grand-dtr, and son.   Active.   Gets regular exercise, walks.    Outpatient Encounter Prescriptions as of 06/01/2016  Medication Sig  . albuterol (PROVENTIL HFA;VENTOLIN HFA) 108 (90 Base) MCG/ACT inhaler Inhale 2 puffs into the lungs every 6 (six) hours as needed for wheezing.  Marland Kitchen alendronate (FOSAMAX) 70 MG tablet Take 1 tablet (70 mg total) by mouth every 7 (seven) days. Take with a full glass of water on an empty stomach.    . ALPRAZolam (XANAX) 0.5 MG tablet Take 1 tablet (0.5 mg total) by mouth daily as needed for anxiety.  Marland Kitchen amoxicillin-clavulanate (AUGMENTIN XR) 1000-62.5 MG 12 hr tablet Take 2 tablets by mouth 2 (two) times daily.  Marland Kitchen aspirin EC 81 MG tablet Take 81 mg by mouth daily.  Marland Kitchen guaiFENesin (MUCINEX) 600 MG 12 hr tablet Take 2 tablets (1,200 mg total) by mouth 2 (two) times daily.  . Multiple Vitamin (MULTIVITAMIN) tablet Take 1 tablet by mouth daily.  Marland Kitchen omeprazole (PRILOSEC) 20 MG capsule Take 20 mg by mouth daily.  . potassium chloride SA (K-DUR,KLOR-CON) 20 MEQ tablet Take 1 tablet (20 mEq total) by mouth 2 (two) times daily.  . traZODone (DESYREL) 50 MG tablet Take 1.5 tablets (75 mg total) by mouth at bedtime.  . triamterene-hydrochlorothiazide (MAXZIDE-25) 37.5-25 MG tablet Take 0.5 tablets by mouth daily.   No facility-administered encounter medications on file as of 06/01/2016.     Review of Systems  Constitutional: Negative for appetite change and unexpected weight change.  HENT: Negative for congestion and sinus pressure.   Respiratory: Negative for cough, chest tightness and shortness of breath.   Cardiovascular: Negative for chest pain, palpitations and leg swelling.  Gastrointestinal: Negative for abdominal pain, diarrhea, nausea and vomiting.  Genitourinary: Negative for difficulty urinating and dysuria.  Musculoskeletal: Negative for joint swelling and myalgias.  Skin: Negative for color change and rash.  Neurological: Negative for dizziness, light-headedness and headaches.       Hand shaking and shuffling gait as outlined.    Psychiatric/Behavioral: Negative for agitation.       Increased stress as outlined.         Objective:    Physical Exam  Constitutional: She appears well-developed and well-nourished. No distress.  HENT:  Nose: Nose normal.  Mouth/Throat: Oropharynx is clear and moist.  Neck: Neck supple. No thyromegaly present.  Cardiovascular: Normal rate and  regular rhythm.   Pulmonary/Chest: Breath sounds normal. No respiratory distress. She has no wheezes.  Abdominal: Soft. Bowel sounds are normal. There is no tenderness.  Musculoskeletal: She exhibits no edema or tenderness.  Lymphadenopathy:    She has no cervical adenopathy.  Neurological:  No tremor noted on exam today.  No unsteadiness with her gait.    Skin: No rash noted. No erythema.  Psychiatric: She has a normal mood and affect. Her behavior is normal.  BP 132/72   Pulse 80   Temp 98.2 F (36.8 C) (Oral)   Ht _0  (1.6 m)   Wt 188 lb 9.6 oz (85.5 kg)   SpO2 98%   BMI 33.41 kg/m  Wt Readings from Last 3 Encounters:  06/01/16 188 lb 9.6 oz (85.5 kg)  04/26/16 189 lb (85.7 kg)  04/03/16 190 lb 8 oz (86.4 kg)     Lab Results  Component Value Date   WBC 9.1 03/10/2016   HGB 12.5 03/10/2016   HCT 36.6 03/10/2016   PLT 223 03/10/2016   GLUCOSE 102 (H) 06/01/2016   CHOL 233 (H) 06/01/2016   TRIG 126.0 06/01/2016   HDL 60.00 06/01/2016   LDLDIRECT 163.4 12/31/2012   LDLCALC 148 (H) 06/01/2016   ALT 19 06/01/2016   AST 19 06/01/2016   NA 142 06/01/2016   K 4.7 06/01/2016   CL 106 06/01/2016   CREATININE 1.11 06/01/2016   BUN 20 06/01/2016   CO2 29 06/01/2016   TSH 1.51 06/01/2016   INR 1.0 12/12/2012   HGBA1C 6.0 06/01/2016    Dg Chest 2 View  Result Date: 03/10/2016 CLINICAL DATA:  65 year old female with new onset of midsternal chest pain since yesterday. EXAM: CHEST  2 VIEW COMPARISON:  Multiple priors, most recently chest x-ray 11/07/2015. FINDINGS: Postoperative changes of right upper lobectomy. Lung volumes are normal. No consolidative airspace disease. No pleural effusions. No pneumothorax. No pulmonary nodule or mass noted. Pulmonary vasculature and the cardiomediastinal silhouette are within normal limits. Surgical clips project over the right upper quadrant of the abdomen, compatible with prior history of cholecystectomy. IMPRESSION: 1.  No  radiographic evidence of acute cardiopulmonary disease. 2. Status post right upper lobectomy. Electronically Signed   By: Vinnie Langton M.D.   On: 03/10/2016 12:58   Ct Angio Chest Pe W And/or Wo Contrast  Result Date: 03/10/2016 CLINICAL DATA:  65 year old female with history of chest pain since yesterday evening. Pain is worse during breathing. Shortness of breath. EXAM: CT ANGIOGRAPHY CHEST WITH CONTRAST TECHNIQUE: Multidetector CT imaging of the chest was performed using the standard protocol during bolus administration of intravenous contrast. Multiplanar CT image reconstructions and MIPs were obtained to evaluate the vascular anatomy. CONTRAST:  60 mL of Isovue 370. COMPARISON:  No priors. FINDINGS: Cardiovascular: No filling defects within the pulmonary arterial tree to suggest underlying pulmonary embolism. Heart size is mildly enlarged. There is no significant pericardial fluid, thickening or pericardial calcification. Mediastinum/Nodes: No pathologically enlarged mediastinal or hilar lymph nodes. Esophagus is unremarkable in appearance. No axillary lymphadenopathy. Lungs/Pleura: Status post right upper lobectomy. No suspicious appearing pulmonary nodule or mass. No acute consolidative airspace disease. No pleural effusions. Upper Abdomen: Status post cholecystectomy. Musculoskeletal: There are no aggressive appearing lytic or blastic lesions noted in the visualized portions of the skeleton. Review of the MIP images confirms the above findings. IMPRESSION: 1. No evidence of pulmonary embolism. 2. No acute findings in the thorax to account for the patient's symptoms. 3. Status post right upper lobectomy. Electronically Signed   By: Vinnie Langton M.D.   On: 03/10/2016 16:03       Assessment & Plan:   Problem List Items Addressed This Visit    CKD (chronic kidney disease), stage III    Creatinine has been stable.  Stay hydrated.  Follow.        GERD (gastroesophageal reflux disease)     Controlled on omeprazole.        Hyperglycemia  Low carb diet and exercise.  Follow met b and a1c.       Relevant Orders   Hemoglobin A1c (Completed)   Hypertension - Primary    Blood pressure under good control.  Continue same medication regimen.  Follow pressures.  Follow metabolic panel.        Relevant Orders   Basic metabolic panel (Completed)   Major depressive disorder with single episode    Overall has been stable.  Using xanax prn.  Increased stress recently.  Discussed with her today.  Discussed referral to psych.  Hold on referral.  Will follow.  Has had some intolerance with SSRIs.  Follow.       Malignant neoplasm of colon ([place])    Colonoscopy 04/25/15 as outlined.  Followed by GI and oncology.  They are following [facility].        Morbid obesity due to excess calories ([facility])    Diet and exercise.  Follow.       Pure hypercholesterolemia    Low cholesterol diet and exercise.  Unable to take statin medication.  Follow cholesterol.        Relevant Orders   Lipid panel (Completed)   Hepatic function panel (Completed)   Tremor    Has noticed some hand shaking and shuffling gait as outlined.  Refer to neurology for further evaluation.        Relevant Orders   TSH (Completed)   Ambulatory referral to Neurology       Einar Pheasant, MD

## 2016-06-12 ENCOUNTER — Encounter: Payer: Self-pay | Admitting: Internal Medicine

## 2016-06-12 NOTE — Assessment & Plan Note (Signed)
Diet and exercise.  Follow.  

## 2016-06-12 NOTE — Assessment & Plan Note (Signed)
Controlled on omeprazole.   

## 2016-06-12 NOTE — Assessment & Plan Note (Signed)
Blood pressure under good control.  Continue same medication regimen.  Follow pressures.  Follow metabolic panel.   

## 2016-06-12 NOTE — Assessment & Plan Note (Signed)
Colonoscopy 04/25/15 as outlined.  Followed by GI and oncology.  They are following [facility].

## 2016-06-12 NOTE — Assessment & Plan Note (Signed)
Low carb diet and exercise.  Follow met b and a1c.  

## 2016-06-12 NOTE — Assessment & Plan Note (Signed)
Has noticed some hand shaking and shuffling gait as outlined.  Refer to neurology for further evaluation.

## 2016-06-12 NOTE — Assessment & Plan Note (Signed)
Creatinine has been stable.  Stay hydrated.  Follow.

## 2016-06-12 NOTE — Assessment & Plan Note (Signed)
Overall has been stable.  Using xanax prn.  Increased stress recently.  Discussed with her today.  Discussed referral to psych.  Hold on referral.  Will follow.  Has had some intolerance with SSRIs.  Follow.

## 2016-06-12 NOTE — Assessment & Plan Note (Signed)
Low cholesterol diet and exercise.  Unable to take statin medication.  Follow cholesterol.

## 2016-06-14 ENCOUNTER — Other Ambulatory Visit: Payer: Self-pay | Admitting: Internal Medicine

## 2016-06-14 NOTE — Telephone Encounter (Signed)
Please advise on refill. Last OV 06/02/15.

## 2016-06-14 NOTE — Telephone Encounter (Signed)
ok'd refill for trazodone #45 with one refill.

## 2016-07-06 ENCOUNTER — Ambulatory Visit (INDEPENDENT_AMBULATORY_CARE_PROVIDER_SITE_OTHER): Payer: Medicare HMO | Admitting: Pulmonary Disease

## 2016-07-06 ENCOUNTER — Encounter: Payer: Self-pay | Admitting: Pulmonary Disease

## 2016-07-06 VITALS — BP 128/84 | HR 88 | Wt 191.0 lb

## 2016-07-06 DIAGNOSIS — Z902 Acquired absence of lung [part of]: Secondary | ICD-10-CM

## 2016-07-06 DIAGNOSIS — Z85118 Personal history of other malignant neoplasm of bronchus and lung: Secondary | ICD-10-CM

## 2016-07-06 DIAGNOSIS — R0609 Other forms of dyspnea: Secondary | ICD-10-CM

## 2016-07-06 DIAGNOSIS — Z8709 Personal history of other diseases of the respiratory system: Secondary | ICD-10-CM

## 2016-07-06 NOTE — Patient Instructions (Signed)
1) Full lung function tests  2) Trial of Bevespi inhaler - samples provided 3) Follow up in 3-4 weeks

## 2016-07-07 IMAGING — MG MM DIGITAL SCREENING BILAT W/ TOMO W/ CAD
8 of 13 series · 8 of 29 positions shown · non-contrast
Comparison: Previous exam(s).

CLINICAL DATA: Screening.

EXAM:
DIGITAL SCREENING BILATERAL MAMMOGRAM WITH 3D TOMO WITH CAD

[R MLO (1 of 2)]
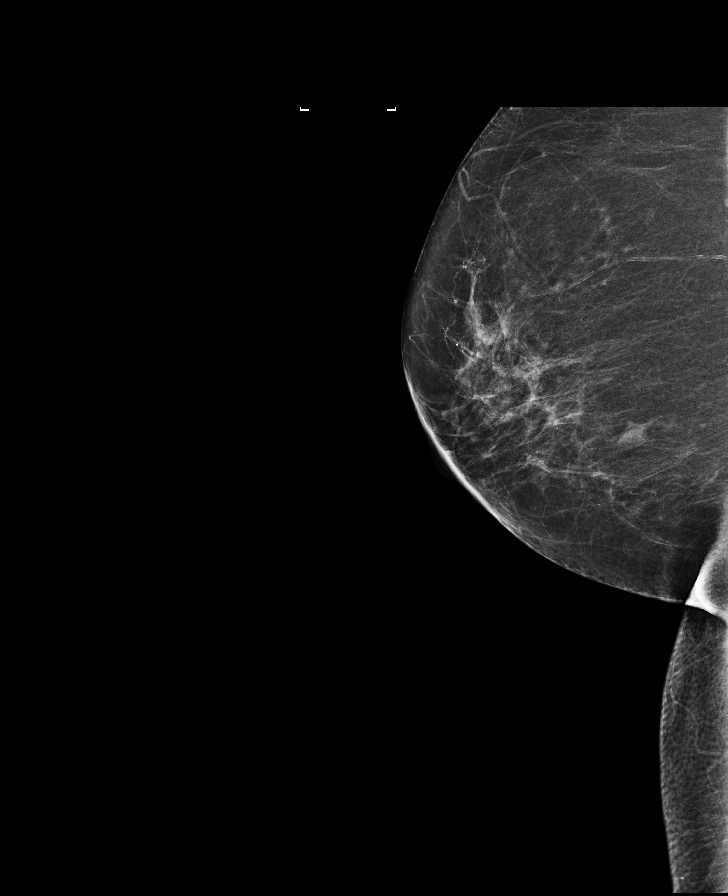

[R CC]
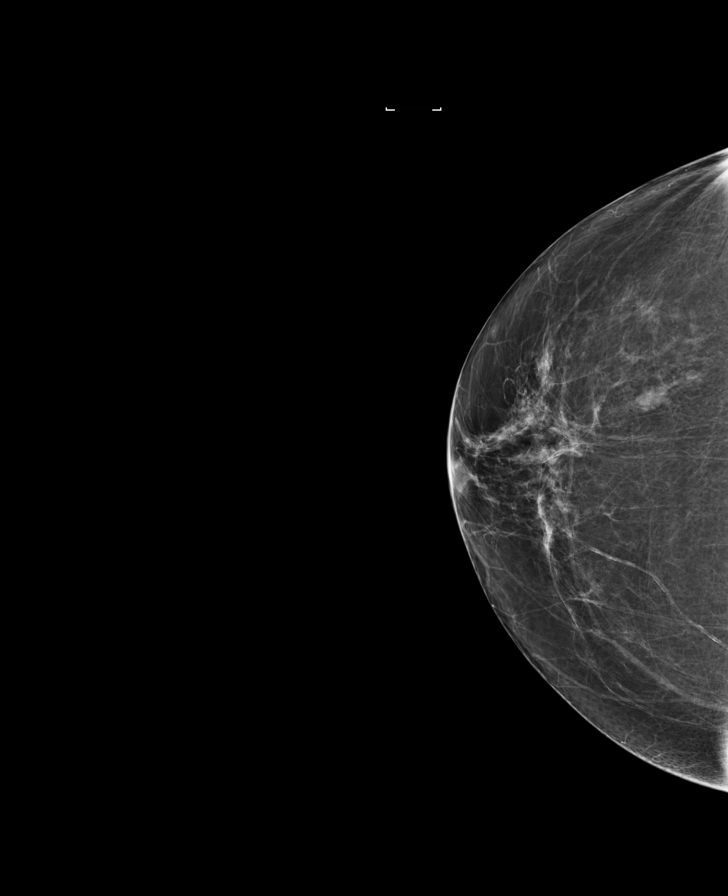

[L CC synth-2D]
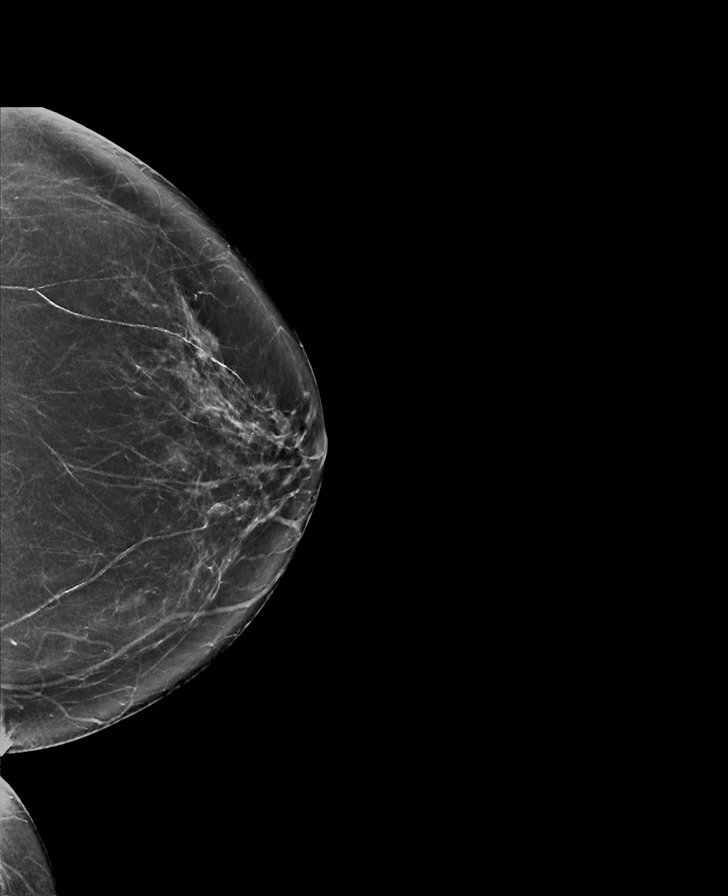

[L MLO]
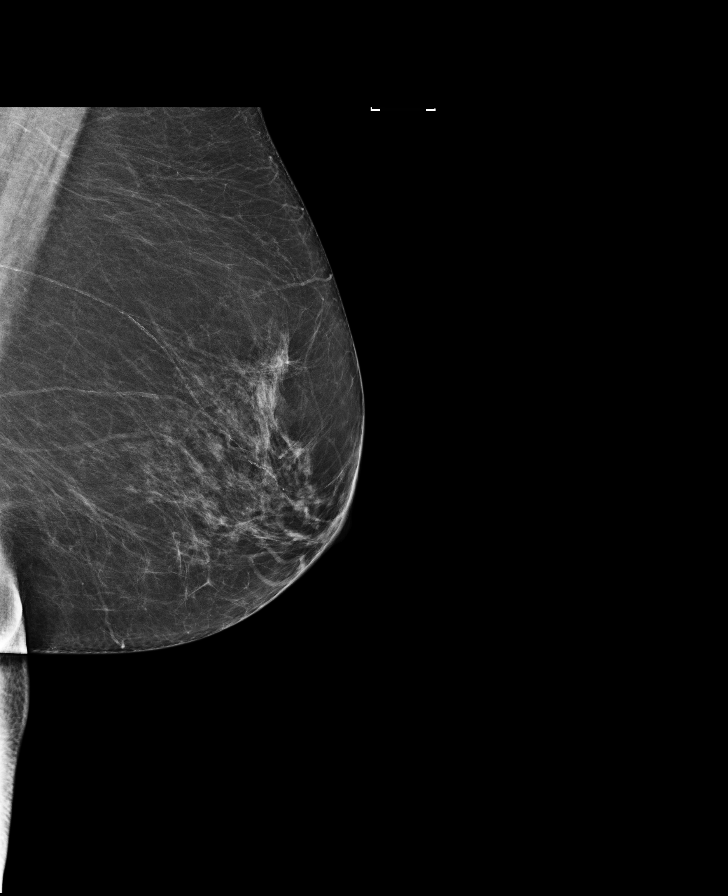

[R MLO synth-2D]
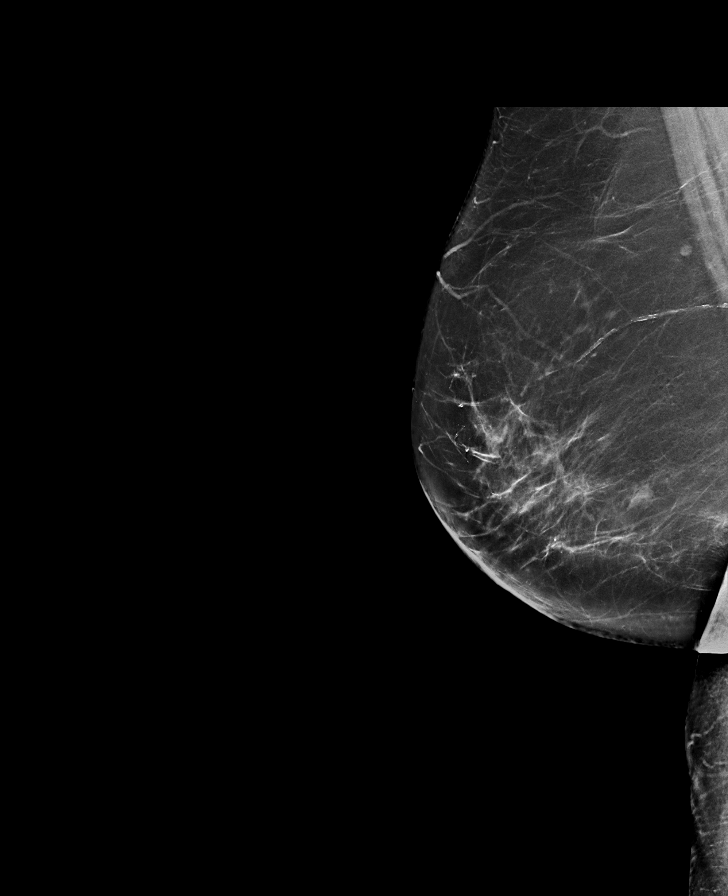

[R MLO (2 of 2)]
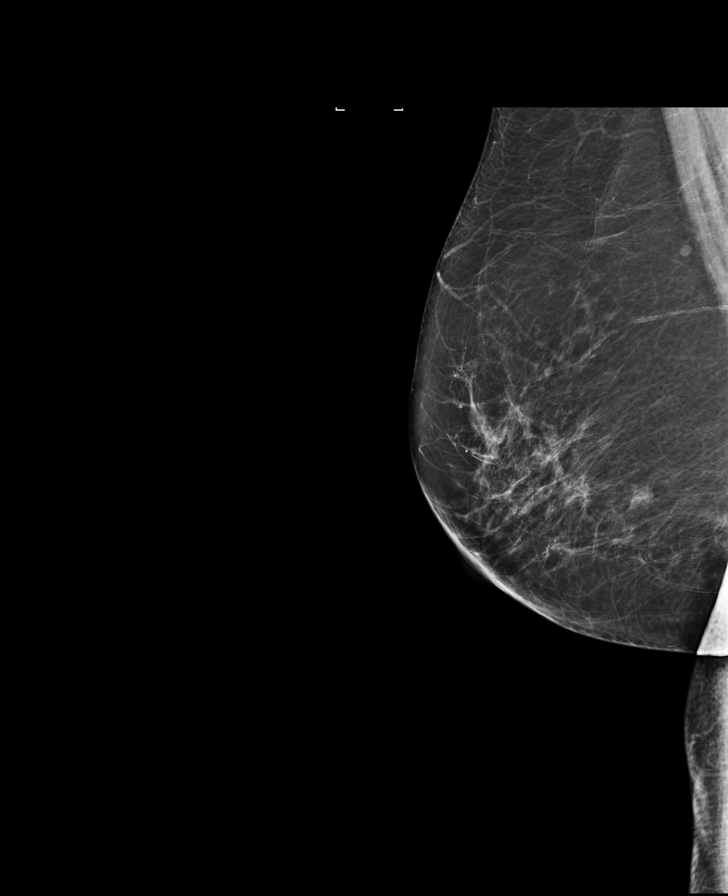

[L MLO synth-2D]
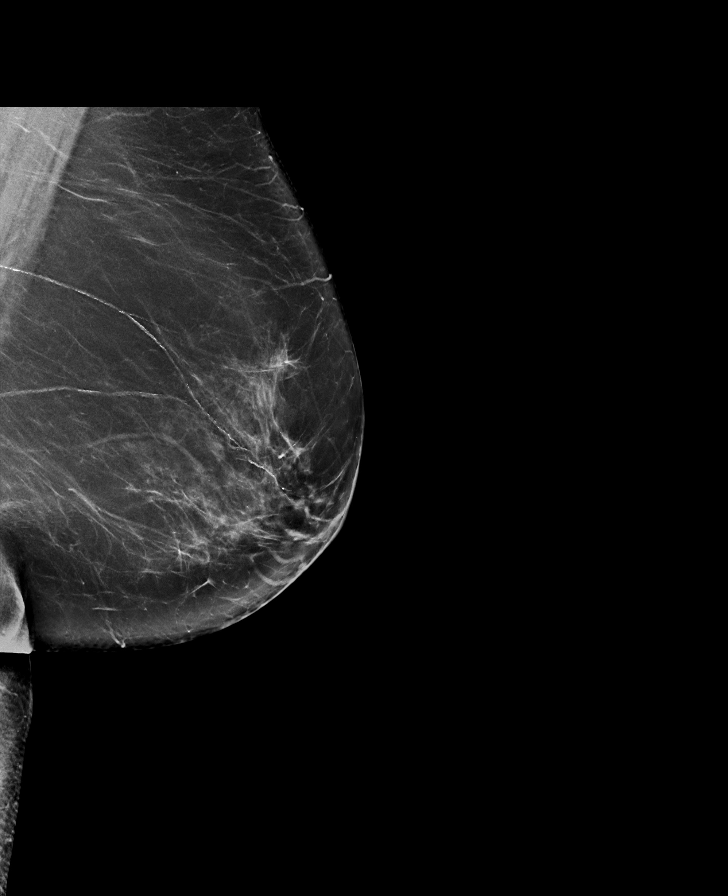

[L CC]
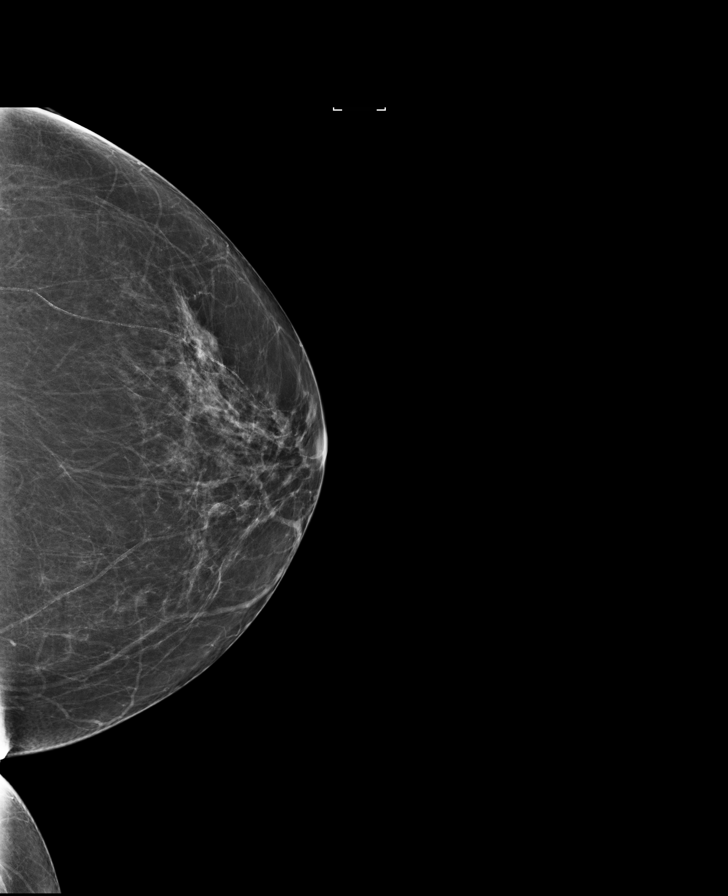

[8 of 29 positions shown; findings below may reference images not displayed]

ACR Breast Density Category b: There are scattered areas of
fibroglandular density.
FINDINGS: In the right breast, a possible asymmetry warrants further
evaluation. In the left breast, no findings suspicious for
malignancy. Images were processed with CAD.
IMPRESSION: Further evaluation is suggested for possible asymmetry in the right
breast.

RECOMMENDATION:
Diagnostic mammogram and possibly ultrasound of the right breast.
(Code:[id number])

The patient will be contacted regarding the findings, and additional
imaging will be scheduled.

BI-RADS CATEGORY  0: Incomplete. Need additional imaging evaluation
and/or prior mammograms for comparison.

## 2016-07-08 NOTE — Progress Notes (Signed)
PULMONARY CONSULT NOTE  Requesting MD/Service: Eliezer Lofts, NP Date of initial consultation: 07/06/16 Reason for consultation: COPD  PT PROFILE: 66 y.o.F never smoker but with significant second hand smoke exposure and prior employment in [employer]. S/P RUL resection in 2008 for lung cancer. Carries diagnosis of COPD. Referred for eval of class II-III dyspnea.  DATA: Echocardiogram 06/21/15: normal LVEF, mildly dilated LA. RVSP est 37 mmHg CT chest 03/10/16: No evidence of pulmonary embolism. No acute findings in the thorax to account for the patient's symptoms. Status post right upper lobectomy  HPI:  As above. She has minimal DTD variation in her respiratory symptoms. She has been prescribed an albuterol inhaler which offers minimal benefit. She does note orthopnea and reports that her DOE is worse in extreme heat. Denies CP, fever, purulent sputum, hemoptysis, LE edema and calf tenderness.  Past Medical History:  Diagnosis Date  . Anxiety   . Atypical chest pain    a. 2003 Cath: reportedly nl per pt [name]);  b. 04/2015 [facility] Admission, neg troponin.  . Colon cancer ([place])    a. 1999: Pt had partial colectomy. Did develop lung metastasis. Had right upper lobectomy in 01/2007 then had associated chemotherapy.  Marland Kitchen COPD (chronic obstructive pulmonary disease) ([place])   . DDD (degenerative disc disease), lumbosacral   . Depression   . Echocardiogram abnormal    a. 02/2010 Echo: EF 55-60%, mild LAE, no regional wall motion abnormalities  . Elevated transaminase level    a. ? NASH;  b. 06/2015 - nl LFTs.  . GERD (gastroesophageal reflux disease)   . Hematuria   . Holter monitor, abnormal    a. 12/2009 Holter monitoring: NSR, rare PACs and PVCs.  Marland Kitchen Hypercholesterolemia   . Hyperlipidemia   . Hypertension   . Leaky heart valve    a. Per pt report - no evidence of valvular abnormalities on prior echoes.  . Lung cancer ([place])    a. S/P resection (lobectomy - right)  . Morbid obesity  ([place])   . Nephrolithiasis     Past Surgical History:  Procedure Laterality Date  . BREAST BIOPSY  10/07/2009   Right breast  . CARDIAC CATHETERIZATION  2003   negative as per pt report  . CHOLECYSTECTOMY  2000  . COLON SURGERY     Colon cancer, removed part of large colon  . COLONOSCOPY N/A 04/25/2015   Procedure: COLONOSCOPY;  Surgeon: Manya Silvas, MD;  Location: [facility] ENDOSCOPY;  Service: Endoscopy;  Laterality: N/A;  . LOBECTOMY     Right lung, upper lobe right side removed  . TOTAL ABDOMINAL HYSTERECTOMY     abnormal bleeding  . TUBAL LIGATION  1980    MEDICATIONS: I have reviewed all medications and confirmed regimen as documented  Social History   Social History  . Marital status: Widowed    Spouse name: N/A  . Number of children: N/A  . Years of education: N/A   Occupational History  . Not on file.   Social History Main Topics  . Smoking status: Passive Smoke Exposure - Never Smoker  . Smokeless tobacco: Never Used     Comment: husband and sons smoked in her home.  . Alcohol use 0.6 oz/week    1 Glasses of wine per week     Comment: rare wine cooler.  . Drug use: No  . Sexual activity: Not on file   Other Topics Concern  . Not on file   Social History Narrative   Widowed.  Disabled (colon/lung CA), retired.   Lives in [place] with her dtr, grand-dtr, and son.   Active.   Gets regular exercise, walks.    Family History  Problem Relation Age of Onset  . Coronary artery disease Mother     died @ 54 of MI.  . Mental illness Mother   . Diabetes Mother   . Heart attack Father 53    MI  . Cancer Father     lung - died in his mid-70's  . Hypertension Father   . Diabetes Father   . Sudden death Brother   . Mental illness Brother   . Heart attack Brother     MI @ 63, died @ age 11.  . Breast cancer Neg Hx     ROS: No fever, myalgias/arthralgias, unexplained weight loss or weight gain No new focal weakness or sensory deficits No  otalgia, hearing loss, visual changes, nasal and sinus symptoms, mouth and throat problems No neck pain or adenopathy No abdominal pain, N/V/D, diarrhea, change in bowel pattern No dysuria, change in urinary pattern   Vitals:   07/06/16 0927  BP: 128/84  Pulse: 88  SpO2: 97%  Weight: 191 lb (86.6 kg)     EXAM:  Gen: WDWN, No overt respiratory distress HEENT: NCAT, sclera white, oropharynx normal Neck: Supple without LAN, thyromegaly, JVD Chest/Lungs: moderate kyphosis, breath sounds mildly diminished, percussion normal, No adventitious sounds Cardiovascular: Reg, no murmurs noted Abdomen: Soft, nontender, normal BS Ext: without clubbing, cyanosis, edema Neuro: CNs grossly intact, motor and sensory intact Skin: Limited exam, no lesions noted  DATA:   BMP Latest Ref Rng & Units 06/01/2016 03/10/2016 02/23/2016  Glucose 70 - 99 mg/dL 102(H) 105(H) 112(H)  BUN 6 - 23 mg/dL 20 24(H) 18  Creatinine 0.40 - 1.20 mg/dL 1.11 1.31(H) 1.29(H)  Sodium 135 - 145 mEq/L 142 139 136  Potassium 3.5 - 5.1 mEq/L 4.7 4.2 3.9  Chloride 96 - 112 mEq/L 106 106 105  CO2 19 - 32 mEq/L '29 27 25  '$ Calcium 8.4 - 10.5 mg/dL 9.7 9.4 9.2    CBC Latest Ref Rng & Units 03/10/2016 02/23/2016 01/30/2016  WBC 3.6 - 11.0 K/uL 9.1 9.5 7.8  Hemoglobin 12.0 - 16.0 g/dL 12.5 12.1 13.3  Hematocrit 35.0 - 47.0 % 36.6 35.5 39.2  Platelets 150 - 440 K/uL 223 252 295.0    CXR:  No recent film  IMPRESSION:     ICD-9-CM ICD-10-CM   1. DOE (dyspnea on exertion) 786.09 R06.09 Pulmonary Function Test [facility] Only  2. History of lung cancer V10.11 Z85.118   3. Status post lobectomy of lung V45.89 Z90.2   4. History of COPD V12.69 Z87.09      PLAN:  1) Trial Bevespi inhaler - 2 actuations BID 2) Full PFTs ordered 3) ROV in 3-4 weeks   Merton Border, MD PCCM service Mobile [phone] Pager [phone] 07/08/2016

## 2016-07-12 IMAGING — MG MM DIAG BREAST TOMO UNI RIGHT
6 of 9 series · 6 of 21 positions shown · non-contrast
Comparison: 11/12/2014 and earlier

CLINICAL DATA: Patient returns after screening study for evaluation
of the right breast.

EXAM:
DIGITAL DIAGNOSTIC RIGHT MAMMOGRAM WITH 3D TOMOSYNTHESIS WITH CAD
ULTRASOUND RIGHT BREAST

[R XCCL]
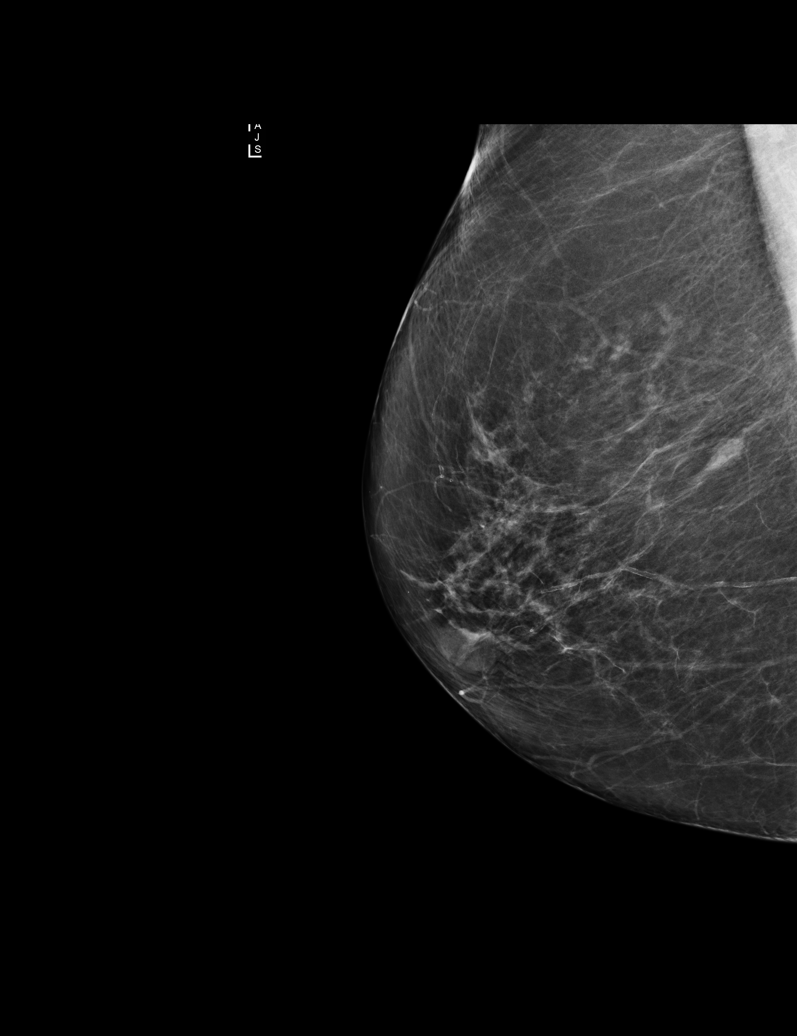

[R ML synth-2D]
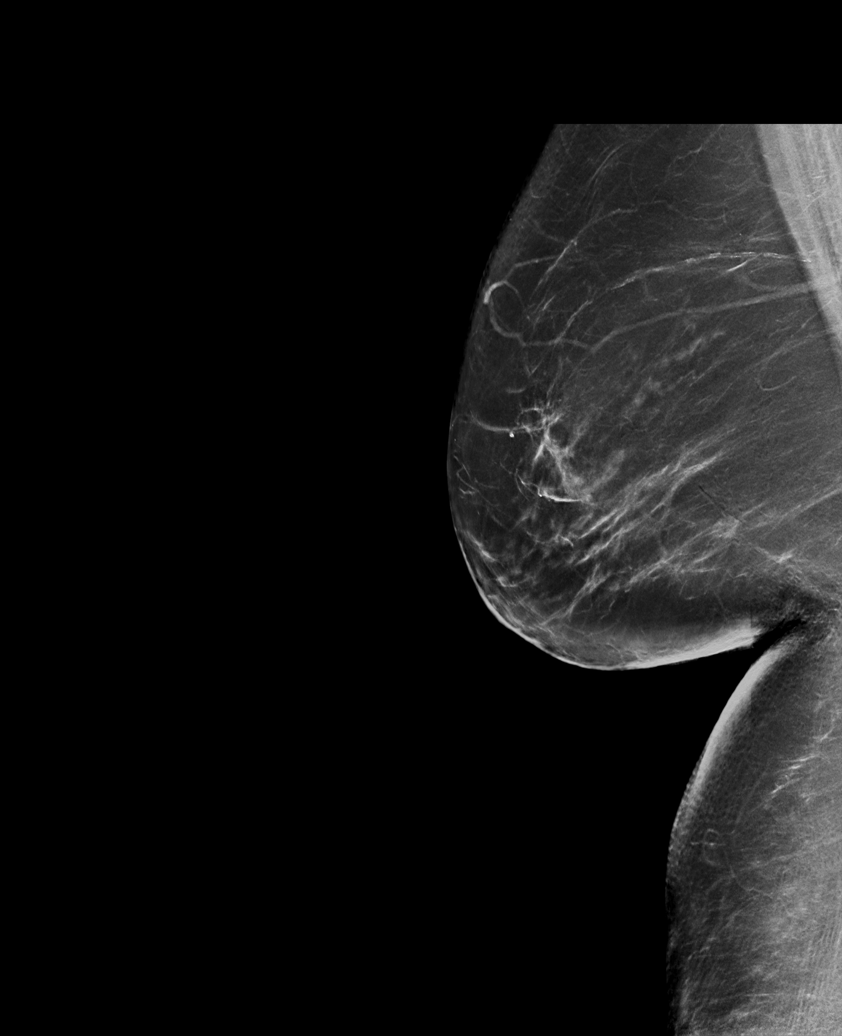

[R MLO]
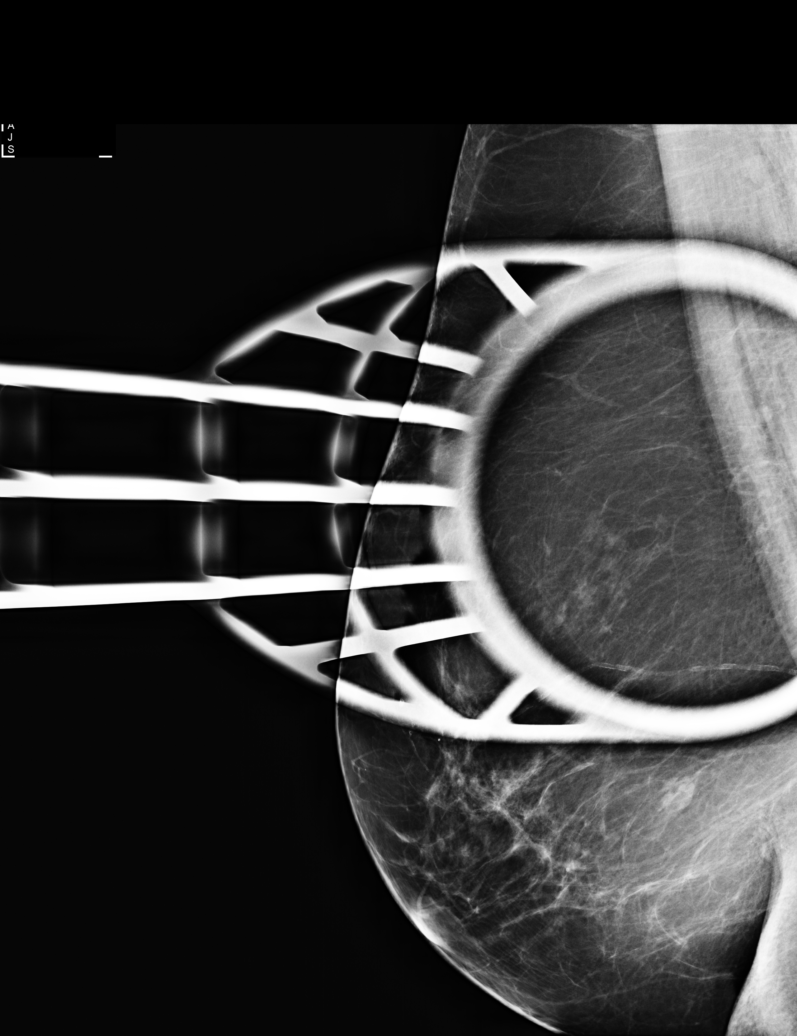

[R MLO synth-2D]
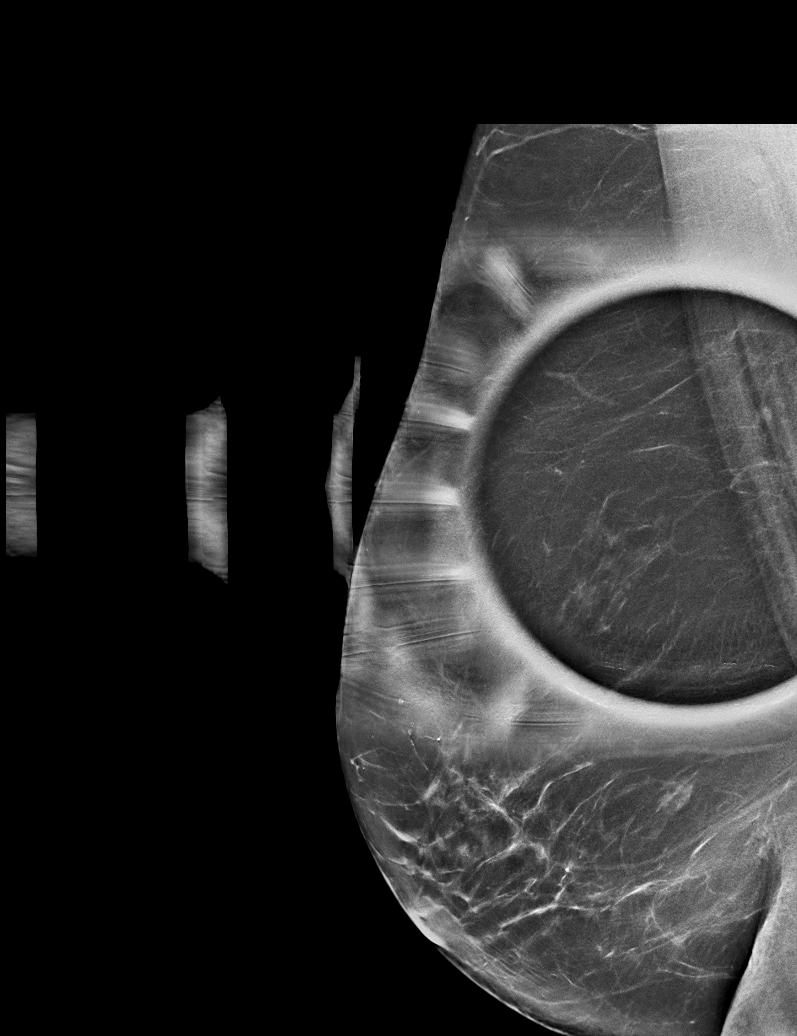

[R ML]
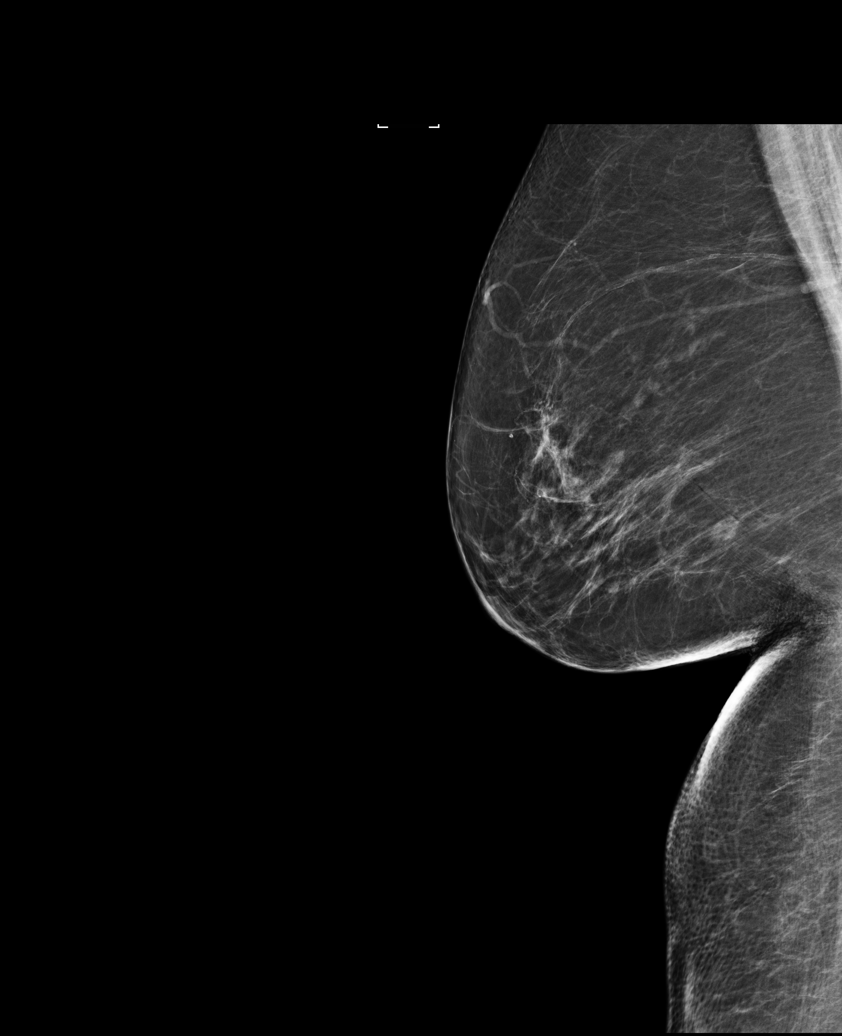

[R XCCL synth-2D]
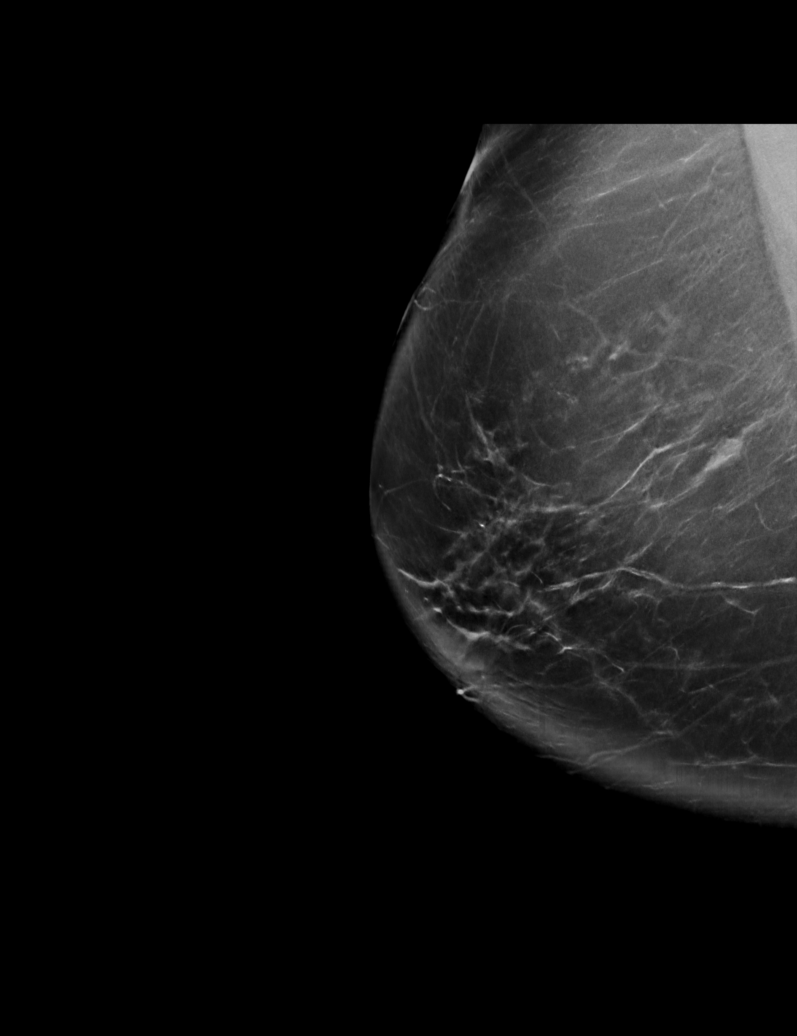

[6 of 21 positions shown; findings below may reference images not displayed]

ACR Breast Density Category b: There are scattered areas of
fibroglandular density.
FINDINGS: Additional views are performed, confirming presence of a 4 mm
circumscribed low density nodule with lucent center in the lateral
posterior portion of the right breast.

Mammographic images were processed with CAD.

On physical exam, I palpate no abnormality in the lateral aspect of
the right breast.

Targeted ultrasound is performed, showing a small hypoechoic nodule
with hyperechoic center in the 930 o'clock location of the right
breast 12 cm from the nipple which measures 0.4 x 0.3 cm. Findings
are consistent with benign intramammary lymph node.
IMPRESSION: Persistent abnormality represents a benign intramammary lymph node
by ultrasound a mammogram. No mammographic or ultrasound evidence
for malignancy.

RECOMMENDATION:
Screening mammogram in one year.(Code:[id number])

I have discussed the findings and recommendations with the patient.
Results were also provided in writing at the conclusion of the
visit. If applicable, a reminder letter will be sent to the patient
regarding the next appointment.

BI-RADS CATEGORY  2: Benign.

## 2016-07-16 ENCOUNTER — Ambulatory Visit (INDEPENDENT_AMBULATORY_CARE_PROVIDER_SITE_OTHER): Payer: Medicare HMO | Admitting: Internal Medicine

## 2016-07-16 ENCOUNTER — Encounter: Payer: Self-pay | Admitting: Internal Medicine

## 2016-07-16 VITALS — BP 132/80 | HR 75 | Temp 98.6°F | Resp 16 | Ht 63.0 in | Wt 196.0 lb

## 2016-07-16 DIAGNOSIS — I10 Essential (primary) hypertension: Secondary | ICD-10-CM

## 2016-07-16 DIAGNOSIS — T7589XA Other specified effects of external causes, initial encounter: Secondary | ICD-10-CM | POA: Diagnosis not present

## 2016-07-16 DIAGNOSIS — E7800 Pure hypercholesterolemia, unspecified: Secondary | ICD-10-CM | POA: Diagnosis not present

## 2016-07-16 DIAGNOSIS — R739 Hyperglycemia, unspecified: Secondary | ICD-10-CM | POA: Diagnosis not present

## 2016-07-16 DIAGNOSIS — F330 Major depressive disorder, recurrent, mild: Secondary | ICD-10-CM

## 2016-07-16 DIAGNOSIS — I6523 Occlusion and stenosis of bilateral carotid arteries: Secondary | ICD-10-CM

## 2016-07-16 DIAGNOSIS — K219 Gastro-esophageal reflux disease without esophagitis: Secondary | ICD-10-CM

## 2016-07-16 DIAGNOSIS — N183 Chronic kidney disease, stage 3 unspecified: Secondary | ICD-10-CM

## 2016-07-16 MED ORDER — ALENDRONATE SODIUM 70 MG PO TABS: 70.0000 mg | ORAL_TABLET | ORAL | 11 refills | Status: DC

## 2016-07-16 MED ORDER — TRIAMTERENE-HCTZ 37.5-25 MG PO TABS: 0.5000 | ORAL_TABLET | Freq: Every day | ORAL | 1 refills | Status: DC

## 2016-07-16 MED ORDER — POTASSIUM CHLORIDE CRYS ER 20 MEQ PO TBCR: 20.0000 meq | EXTENDED_RELEASE_TABLET | Freq: Two times a day (BID) | ORAL | 2 refills | Status: DC

## 2016-07-16 MED ORDER — TRAZODONE HCL 50 MG PO TABS: ORAL_TABLET | ORAL | 1 refills | Status: DC

## 2016-07-16 MED ORDER — ALBUTEROL SULFATE HFA 108 (90 BASE) MCG/ACT IN AERS: 2.0000 | INHALATION_SPRAY | Freq: Four times a day (QID) | RESPIRATORY_TRACT | 2 refills | Status: DC | PRN

## 2016-07-16 NOTE — Progress Notes (Signed)
Pre-visit discussion using our clinic review tool. No additional management support is needed unless otherwise documented below in the visit note.  

## 2016-07-16 NOTE — Progress Notes (Signed)
Patient ID: Victoria Hanson, female   DOB: [birth year], 66 y.o.   MRN: [id number]   Subjective:    Patient ID: Victoria Hanson, female    DOB: [birth year], 66 y.o.   MRN: [id number]  HPI  Patient here for a scheduled follow up.  Just saw Dr Alva Garnet 07/06/16 for f/u DOE.  Has a h/o lung cancer s/p RUL.  Also carries diagnosis of COPD.  Recommended a trial of Bevespi inhaler.  Recommended f/u in 3-4 weeks.  She reports she did not tolerate the inhaler.  Off now.  Feels her breathing is stable.  No chest pain.  Is doing better.  No acid reflux.  No abdominal pain.  Bowels stable.  Not using xanax often.  Handling stress better.  Has f/u planned with [facility] in two days.     Past Medical History:  Diagnosis Date  . Anxiety   . Atypical chest pain    a. 2003 Cath: reportedly nl per pt [name]);  b. 04/2015 [place] Admission, neg troponin.  . Colon cancer ([place])    a. 1999: Pt had partial colectomy. Did develop lung metastasis. Had right upper lobectomy in 01/2007 then had associated chemotherapy.  Marland Kitchen COPD (chronic obstructive pulmonary disease) ([place])   . DDD (degenerative disc disease), lumbosacral   . Depression   . Echocardiogram abnormal    a. 02/2010 Echo: EF 55-60%, mild LAE, no regional wall motion abnormalities  . Elevated transaminase level    a. ? NASH;  b. 06/2015 - nl LFTs.  . GERD (gastroesophageal reflux disease)   . Hematuria   . Holter monitor, abnormal    a. 12/2009 Holter monitoring: NSR, rare PACs and PVCs.  Marland Kitchen Hypercholesterolemia   . Hyperlipidemia   . Hypertension   . Leaky heart valve    a. Per pt report - no evidence of valvular abnormalities on prior echoes.  . Lung cancer ([place])    a. S/P resection (lobectomy - right)  . Morbid obesity ([place])   . Nephrolithiasis    Past Surgical History:  Procedure Laterality Date  . BREAST BIOPSY  10/07/2009   Right breast  . CARDIAC CATHETERIZATION  2003   negative as per pt report  . CHOLECYSTECTOMY  2000  . COLON SURGERY     Colon  cancer, removed part of large colon  . COLONOSCOPY N/A 04/25/2015   Procedure: COLONOSCOPY;  Surgeon: Manya Silvas, MD;  Location: [facility] ENDOSCOPY;  Service: Endoscopy;  Laterality: N/A;  . LOBECTOMY     Right lung, upper lobe right side removed  . TOTAL ABDOMINAL HYSTERECTOMY     abnormal bleeding  . TUBAL LIGATION  1980   Family History  Problem Relation Age of Onset  . Coronary artery disease Mother     died @ 49 of MI.  . Mental illness Mother   . Diabetes Mother   . Heart attack Father 76    MI  . Cancer Father     lung - died in his mid-70's  . Hypertension Father   . Diabetes Father   . Sudden death Brother   . Mental illness Brother   . Heart attack Brother     MI @ 27, died @ age 57.  . Breast cancer Neg Hx    Social History   Social History  . Marital status: Widowed    Spouse name: N/A  . Number of children: N/A  . Years of education: N/A   Social History Main Topics  .  Smoking status: Passive Smoke Exposure - Never Smoker  . Smokeless tobacco: Never Used     Comment: husband and sons smoked in her home.  . Alcohol use 0.6 oz/week    1 Glasses of wine per week     Comment: rare wine cooler.  . Drug use: No  . Sexual activity: Not Asked   Other Topics Concern  . None   Social History Narrative   Widowed.   Disabled (colon/lung CA), retired.   Lives in [place] with her dtr, grand-dtr, and son.   Active.   Gets regular exercise, walks.    Outpatient Encounter Prescriptions as of 07/16/2016  Medication Sig  . albuterol (PROVENTIL HFA;VENTOLIN HFA) 108 (90 Base) MCG/ACT inhaler Inhale 2 puffs into the lungs every 6 (six) hours as needed for wheezing.  Marland Kitchen alendronate (FOSAMAX) 70 MG tablet Take 1 tablet (70 mg total) by mouth every 7 (seven) days. Take with a full glass of water on an empty stomach.  . ALPRAZolam (XANAX) 0.5 MG tablet Take 1 tablet (0.5 mg total) by mouth daily as needed for anxiety.  Marland Kitchen aspirin EC 81 MG tablet Take 81 mg by  mouth daily.  . Multiple Vitamin (MULTIVITAMIN) tablet Take 1 tablet by mouth daily.  Marland Kitchen omeprazole (PRILOSEC) 20 MG capsule Take 20 mg by mouth daily.  . potassium chloride SA (K-DUR,KLOR-CON) 20 MEQ tablet Take 1 tablet (20 mEq total) by mouth 2 (two) times daily.  . traZODone (DESYREL) 50 MG tablet TAKE ONE AND ONE-HALF TABLET BY MOUTH ATBEDTIME  . triamterene-hydrochlorothiazide (MAXZIDE-25) 37.5-25 MG tablet Take 0.5 tablets by mouth daily.  . [DISCONTINUED] albuterol (PROVENTIL HFA;VENTOLIN HFA) 108 (90 Base) MCG/ACT inhaler Inhale 2 puffs into the lungs every 6 (six) hours as needed for wheezing.  . [DISCONTINUED] alendronate (FOSAMAX) 70 MG tablet Take 1 tablet (70 mg total) by mouth every 7 (seven) days. Take with a full glass of water on an empty stomach.  . [DISCONTINUED] amoxicillin-clavulanate (AUGMENTIN XR) 1000-62.5 MG 12 hr tablet Take 2 tablets by mouth 2 (two) times daily. (Patient not taking: Reported on 07/16/2016)  . [DISCONTINUED] guaiFENesin (MUCINEX) 600 MG 12 hr tablet Take 2 tablets (1,200 mg total) by mouth 2 (two) times daily. (Patient not taking: Reported on 07/16/2016)  . [DISCONTINUED] potassium chloride SA (K-DUR,KLOR-CON) 20 MEQ tablet Take 1 tablet (20 mEq total) by mouth 2 (two) times daily.  . [DISCONTINUED] traZODone (DESYREL) 50 MG tablet TAKE ONE AND ONE-HALF TABLET BY MOUTH ATBEDTIME  . [DISCONTINUED] triamterene-hydrochlorothiazide (MAXZIDE-25) 37.5-25 MG tablet Take 0.5 tablets by mouth daily.   No facility-administered encounter medications on file as of 07/16/2016.     Review of Systems  Constitutional: Negative for appetite change and unexpected weight change.  HENT: Negative for congestion and sinus pressure.   Respiratory: Negative for cough, chest tightness and shortness of breath.   Cardiovascular: Negative for chest pain, palpitations and leg swelling.  Gastrointestinal: Negative for abdominal pain, diarrhea, nausea and vomiting.  Genitourinary:  Negative for difficulty urinating and dysuria.  Musculoskeletal: Negative for back pain and joint swelling.  Skin: Negative for color change and rash.  Neurological: Negative for dizziness, light-headedness and headaches.  Psychiatric/Behavioral: Negative for agitation and dysphoric mood.       Objective:    Physical Exam  Constitutional: She appears well-developed and well-nourished. No distress.  HENT:  Nose: Nose normal.  Mouth/Throat: Oropharynx is clear and moist.  Neck: Neck supple. No thyromegaly present.  Cardiovascular: Normal rate and regular rhythm.  Pulmonary/Chest: Breath sounds normal. No respiratory distress. She has no wheezes.  Abdominal: Soft. Bowel sounds are normal. There is no tenderness.  Musculoskeletal: She exhibits no edema or tenderness.  Lymphadenopathy:    She has no cervical adenopathy.  Skin: No rash noted. No erythema.  Psychiatric: She has a normal mood and affect. Her behavior is normal.    BP 132/80 (BP Location: Left Arm, Patient Position: Sitting, Cuff Size: Large)   Pulse 75   Temp 98.6 F (37 C) (Oral)   Resp 16   Ht '5\' 3"'$  (1.6 m)   Wt 196 lb (88.9 kg)   SpO2 98%   BMI 34.72 kg/m  Wt Readings from Last 3 Encounters:  07/19/16 190 lb 12.8 oz (86.5 kg)  07/16/16 196 lb (88.9 kg)  07/06/16 191 lb (86.6 kg)     Lab Results  Component Value Date   WBC 9.1 03/10/2016   HGB 12.5 03/10/2016   HCT 36.6 03/10/2016   PLT 223 03/10/2016   GLUCOSE 102 (H) 06/01/2016   CHOL 233 (H) 06/01/2016   TRIG 126.0 06/01/2016   HDL 60.00 06/01/2016   LDLDIRECT 163.4 12/31/2012   LDLCALC 148 (H) 06/01/2016   ALT 19 06/01/2016   AST 19 06/01/2016   NA 142 06/01/2016   K 4.7 06/01/2016   CL 106 06/01/2016   CREATININE 1.11 06/01/2016   BUN 20 06/01/2016   CO2 29 06/01/2016   TSH 1.51 06/01/2016   INR 1.0 12/12/2012   HGBA1C 6.0 06/01/2016    Dg Chest 2 View  Result Date: 03/10/2016 CLINICAL DATA:  66 year old female with new onset of  midsternal chest pain since yesterday. EXAM: CHEST  2 VIEW COMPARISON:  Multiple priors, most recently chest x-ray 11/07/2015. FINDINGS: Postoperative changes of right upper lobectomy. Lung volumes are normal. No consolidative airspace disease. No pleural effusions. No pneumothorax. No pulmonary nodule or mass noted. Pulmonary vasculature and the cardiomediastinal silhouette are within normal limits. Surgical clips project over the right upper quadrant of the abdomen, compatible with prior history of cholecystectomy. IMPRESSION: 1.  No radiographic evidence of acute cardiopulmonary disease. 2. Status post right upper lobectomy. Electronically Signed   By: Vinnie Langton M.D.   On: 03/10/2016 12:58   Ct Angio Chest Pe W And/or Wo Contrast  Result Date: 03/10/2016 CLINICAL DATA:  66 year old female with history of chest pain since yesterday evening. Pain is worse during breathing. Shortness of breath. EXAM: CT ANGIOGRAPHY CHEST WITH CONTRAST TECHNIQUE: Multidetector CT imaging of the chest was performed using the standard protocol during bolus administration of intravenous contrast. Multiplanar CT image reconstructions and MIPs were obtained to evaluate the vascular anatomy. CONTRAST:  60 mL of Isovue 370. COMPARISON:  No priors. FINDINGS: Cardiovascular: No filling defects within the pulmonary arterial tree to suggest underlying pulmonary embolism. Heart size is mildly enlarged. There is no significant pericardial fluid, thickening or pericardial calcification. Mediastinum/Nodes: No pathologically enlarged mediastinal or hilar lymph nodes. Esophagus is unremarkable in appearance. No axillary lymphadenopathy. Lungs/Pleura: Status post right upper lobectomy. No suspicious appearing pulmonary nodule or mass. No acute consolidative airspace disease. No pleural effusions. Upper Abdomen: Status post cholecystectomy. Musculoskeletal: There are no aggressive appearing lytic or blastic lesions noted in the visualized  portions of the skeleton. Review of the MIP images confirms the above findings. IMPRESSION: 1. No evidence of pulmonary embolism. 2. No acute findings in the thorax to account for the patient's symptoms. 3. Status post right upper lobectomy. Electronically Signed   By: Quillian Quince  Entrikin M.D.   On: 03/10/2016 16:03       Assessment & Plan:   Problem List Items Addressed This Visit    Carotid stenosis    Due f/u with [facility].  Scheduled to see them in two days.  Follow.       Relevant Medications   triamterene-hydrochlorothiazide (MAXZIDE-25) 37.5-25 MG tablet   CKD (chronic kidney disease), stage III    Stay hydrated.  Creatinine last checked - 1.11.        Depression, major, recurrent ([name])    Stable.  Doing better.  Trazodone to help her sleep.  Not using xanax often.  Follow.       Relevant Medications   traZODone (DESYREL) 50 MG tablet   GERD (gastroesophageal reflux disease)    Controlled on omeprazole.        Hypercholesterolemia    Low cholesterol diet and exercise.  Follow lipid panel.        Relevant Medications   triamterene-hydrochlorothiazide (MAXZIDE-25) 37.5-25 MG tablet   Other Relevant Orders   Lipid panel   Hepatic function panel   Hyperglycemia    Low carb diet and exercise.  Follow met b and a1c.        Relevant Orders   Hemoglobin A1c   Hypertension    Blood pressure under good control.  Continue same medication regimen.  Follow pressures.  Follow metabolic panel.        Relevant Medications   triamterene-hydrochlorothiazide (MAXZIDE-25) 37.5-25 MG tablet   Other Relevant Orders   Basic metabolic panel    Other Visit Diagnoses    Effects of exposure to external cause, initial encounter    -  Primary   Relevant Orders   Hepatitis C antibody (Completed)       Einar Pheasant, MD

## 2016-07-17 LAB — HEPATITIS C ANTIBODY: HCV Ab: NEGATIVE

## 2016-07-19 ENCOUNTER — Ambulatory Visit (INDEPENDENT_AMBULATORY_CARE_PROVIDER_SITE_OTHER): Payer: Medicare HMO | Admitting: Vascular Surgery

## 2016-07-19 ENCOUNTER — Ambulatory Visit (INDEPENDENT_AMBULATORY_CARE_PROVIDER_SITE_OTHER): Payer: Medicare HMO

## 2016-07-19 ENCOUNTER — Encounter (INDEPENDENT_AMBULATORY_CARE_PROVIDER_SITE_OTHER): Payer: Self-pay | Admitting: Vascular Surgery

## 2016-07-19 VITALS — BP 146/89 | HR 72 | Resp 16 | Wt 190.8 lb

## 2016-07-19 DIAGNOSIS — I10 Essential (primary) hypertension: Secondary | ICD-10-CM

## 2016-07-19 DIAGNOSIS — E7800 Pure hypercholesterolemia, unspecified: Secondary | ICD-10-CM | POA: Diagnosis not present

## 2016-07-19 DIAGNOSIS — I779 Disorder of arteries and arterioles, unspecified: Secondary | ICD-10-CM | POA: Insufficient documentation

## 2016-07-19 DIAGNOSIS — I6523 Occlusion and stenosis of bilateral carotid arteries: Secondary | ICD-10-CM

## 2016-07-19 DIAGNOSIS — I6529 Occlusion and stenosis of unspecified carotid artery: Secondary | ICD-10-CM | POA: Insufficient documentation

## 2016-07-19 DIAGNOSIS — G459 Transient cerebral ischemic attack, unspecified: Secondary | ICD-10-CM | POA: Diagnosis not present

## 2016-07-19 LAB — VAS US CAROTID
LCCADSYS: -94 cm/s
LEFT ECA DIAS: -20 cm/s
LEFT VERTEBRAL DIAS: 21 cm/s
LICADSYS: -96 cm/s
LICAPDIAS: -27 cm/s
Left CCA dist dias: -29 cm/s
Left CCA prox dias: 23 cm/s
Left CCA prox sys: 109 cm/s
Left ICA dist dias: -35 cm/s
Left ICA prox sys: -96 cm/s
RCCADSYS: -65 cm/s
RCCAPDIAS: 22 cm/s
RCCAPSYS: 99 cm/s
RIGHT CCA MID DIAS: 27 cm/s
RIGHT ECA DIAS: -15 cm/s
RIGHT VERTEBRAL DIAS: 20 cm/s

## 2016-07-19 NOTE — Progress Notes (Signed)
MRN : [id number]  Victoria Hanson is a 66 y.o. (23-Jul-1950) female who presents with chief complaint of  Chief Complaint  Patient presents with  . Follow-up  .  History of Present Illness: The patient is seen for follow up evaluation of carotid stenosis. The carotid stenosis followed by ultrasound.   The patient denies amaurosis fugax. There is no recent history of TIA symptoms or focal motor deficits. There is no prior documented CVA.  The patient is taking enteric-coated aspirin 81 mg daily.  There is no history of migraine headaches. There is no history of seizures.  The patient has a history of coronary artery disease, no recent episodes of angina or shortness of breath. The patient denies PAD or claudication symptoms. There is a history of hyperlipidemia which is being treated with a statin.    Carotid Duplex done today shows <30% stenosis bilaterally.  No change compared to last study in 02/18/2015.  Current Meds  Medication Sig  . albuterol (PROVENTIL HFA;VENTOLIN HFA) 108 (90 Base) MCG/ACT inhaler Inhale 2 puffs into the lungs every 6 (six) hours as needed for wheezing.  Marland Kitchen alendronate (FOSAMAX) 70 MG tablet Take 1 tablet (70 mg total) by mouth every 7 (seven) days. Take with a full glass of water on an empty stomach.  . ALPRAZolam (XANAX) 0.5 MG tablet Take 1 tablet (0.5 mg total) by mouth daily as needed for anxiety.  Marland Kitchen aspirin EC 81 MG tablet Take 81 mg by mouth daily.  . Multiple Vitamin (MULTIVITAMIN) tablet Take 1 tablet by mouth daily.  Marland Kitchen omeprazole (PRILOSEC) 20 MG capsule Take 20 mg by mouth daily.  . potassium chloride SA (K-DUR,KLOR-CON) 20 MEQ tablet Take 1 tablet (20 mEq total) by mouth 2 (two) times daily.  . traZODone (DESYREL) 50 MG tablet TAKE ONE AND ONE-HALF TABLET BY MOUTH ATBEDTIME  . triamterene-hydrochlorothiazide (MAXZIDE-25) 37.5-25 MG tablet Take 0.5 tablets by mouth daily.    Past Medical History:  Diagnosis Date  . Anxiety   . Atypical chest  pain    a. 2003 Cath: reportedly nl per pt [name]);  b. 04/2015 [place] Admission, neg troponin.  . Colon cancer ([place])    a. 1999: Pt had partial colectomy. Did develop lung metastasis. Had right upper lobectomy in 01/2007 then had associated chemotherapy.  Marland Kitchen COPD (chronic obstructive pulmonary disease) ([place])   . DDD (degenerative disc disease), lumbosacral   . Depression   . Echocardiogram abnormal    a. 02/2010 Echo: EF 55-60%, mild LAE, no regional wall motion abnormalities  . Elevated transaminase level    a. ? NASH;  b. 06/2015 - nl LFTs.  . GERD (gastroesophageal reflux disease)   . Hematuria   . Holter monitor, abnormal    a. 12/2009 Holter monitoring: NSR, rare PACs and PVCs.  Marland Kitchen Hypercholesterolemia   . Hyperlipidemia   . Hypertension   . Leaky heart valve    a. Per pt report - no evidence of valvular abnormalities on prior echoes.  . Lung cancer ([place])    a. S/P resection (lobectomy - right)  . Morbid obesity ([place])   . Nephrolithiasis     Past Surgical History:  Procedure Laterality Date  . BREAST BIOPSY  10/07/2009   Right breast  . CARDIAC CATHETERIZATION  2003   negative as per pt report  . CHOLECYSTECTOMY  2000  . COLON SURGERY     Colon cancer, removed part of large colon  . COLONOSCOPY N/A 04/25/2015   Procedure: COLONOSCOPY;  Surgeon: Manya Silvas, MD;  Location[facility] END[facility];  Service: Endoscopy;  Laterality: N/A;  . LOBECTOMY     Right lung, upper lobe right side removed  . TOTAL ABDOMINAL HYSTERECTOMY     abnormal bleeding  . TUBAL LIGATION  1980    Social History Social History  Substance Use Topics  . Smoking status: Passive Smoke Exposure - Never Smoker  . Smokeless tobacco: Never Used     Comment: husband and sons smoked in her home.  . Alcohol use 0.6 oz/week    1 Glasses of wine per week     Comment: rare wine cooler.    Family History Family History  Problem Relation Age of Onset  . Coronary artery disease Mother     died @ 50 of  MI.  . Mental illness Mother   . Diabetes Mother   . Heart attack Father 37    MI  . Cancer Father     lung - died in his mid-70's  . Hypertension Father   . Diabetes Father   . Sudden death Brother   . Mental illness Brother   . Heart attack Brother     MI @ 27, died @ age 22.  . Breast cancer Neg Hx   No family history of bleeding/clotting disorders, porphyria or autoimmune disease   Allergies  Allergen Reactions  . Macrobid [Nitrofurantoin Macrocrystal] Shortness Of Breath and Palpitations  . Antihistamines, Diphenhydramine-Type Other (See Comments)    She does not know which antihistamine she has an allergic reaction to.  . Aspirin Other (See Comments)    Makes her sick on the stomach if she takes too many and causes bleeding.  Of note, she can take ibuprofen.  . Ceftin [Cefuroxime Axetil] Other (See Comments)    Chest pains  . Celecoxib Other (See Comments)    Reaction: Blood Disorder GI bleeding.  Marland Kitchen Cymbalta [Duloxetine Hcl] Other (See Comments)    Reaction: unknown  . Diphenhydramine Other (See Comments)  . Escitalopram Other (See Comments)  . Lexapro [Escitalopram Oxalate] Other (See Comments)    Reaction: Difficulty with speech  . Metronidazole Other (See Comments)    Other reaction(s): Unknown  . Zoloft [Sertraline Hcl]   . Ciprofloxacin Other (See Comments)    Reaction: unknown     REVIEW OF SYSTEMS (Negative unless checked)  Constitutional: '[]'$ Weight loss  '[]'$ Fever  '[]'$ Chills Cardiac: '[]'$ Chest pain   '[]'$ Chest pressure   '[]'$ Palpitations   '[]'$ Shortness of breath when laying flat   '[]'$ Shortness of breath with exertion. Vascular:  '[]'$ Pain in legs with walking   '[]'$ Pain in legs at rest  '[]'$ History of DVT   '[]'$ Phlebitis   '[]'$ Swelling in legs   '[]'$ Varicose veins   '[]'$ Non-healing ulcers Pulmonary:   '[]'$ Uses home oxygen   '[]'$ Productive cough   '[]'$ Hemoptysis   '[]'$ Wheeze  '[]'$ COPD   '[]'$ Asthma Neurologic:  '[]'$ Dizziness   '[]'$ Seizures   '[]'$ History of stroke   '[]'$ History of TIA  '[]'$ Aphasia    '[]'$ Vissual changes   '[]'$ Weakness or numbness in arm   '[]'$ Weakness or numbness in leg Musculoskeletal:   '[]'$ Joint swelling   '[x]'$ Joint pain   '[]'$ Low back pain Hematologic:  '[]'$ Easy bruising  '[]'$ Easy bleeding   '[]'$ Hypercoagulable state   '[]'$ Anemic Gastrointestinal:  '[]'$ Diarrhea   '[]'$ Vomiting  '[]'$ Gastroesophageal reflux/heartburn   '[]'$ Difficulty swallowing. Genitourinary:  '[]'$ Chronic kidney disease   '[]'$ Difficult urination  '[]'$ Frequent urination   '[]'$ Blood in urine Skin:  '[]'$ Rashes   '[]'$ Ulcers  Psychological:  '[]'$ History of anxiety   '[]'$   History of major depression.  Physical Examination  Vitals:   07/19/16 1136 07/19/16 1137  BP: (!) 142/88 (!) 146/89  Pulse: 72   Resp: 16   Weight: 190 lb 12.8 oz (86.5 kg)    Body mass index is 33.8 kg/m. Gen: WD/WN, NAD Head: /AT, No temporalis wasting.  Ear/Nose/Throat: Hearing grossly intact, nares w/o erythema or drainage, poor dentition Eyes: PER, EOMI, sclera nonicteric.  Neck: Supple, no masses.  No bruit or JVD.  Pulmonary:  Good air movement, clear to auscultation bilaterally, no use of accessory muscles.  Cardiac: RRR, normal S1, S2, no Murmurs. Vascular:  Bilateral carotid bruits Vessel Right Left  Radial Palpable Palpable  Ulnar Palpable Palpable  Brachial Palpable Palpable  Carotid Palpable Palpable  Femoral Palpable Palpable  Popliteal Palpable Palpable  PT Palpable Palpable  DP Palpable Palpable   Gastrointestinal: soft, non-distended. No guarding/no peritoneal signs.  Musculoskeletal: M/S 5/5 throughout.  No deformity or atrophy.  Neurologic: CN 2-12 intact. Pain and light touch intact in extremities.  Symmetrical.  Speech is fluent. Motor exam as listed above. Psychiatric: Judgment intact, Mood & affect appropriate for pt's clinical situation. Dermatologic: No rashes or ulcers noted.  No changes consistent with cellulitis. Lymph : No Cervical lymphadenopathy, no lichenification or skin changes of chronic lymphedema.  CBC Lab Results    Component Value Date   WBC 9.1 03/10/2016   HGB 12.5 03/10/2016   HCT 36.6 03/10/2016   MCV 84.5 03/10/2016   PLT 223 03/10/2016    BMET    Component Value Date/Time   NA 142 06/01/2016 1136   NA 134 (L) 09/01/2014 1359   K 4.7 06/01/2016 1136   K 4.1 09/01/2014 1359   CL 106 06/01/2016 1136   CL 103 09/01/2014 1359   CO2 29 06/01/2016 1136   CO2 27 09/01/2014 1359   GLUCOSE 102 (H) 06/01/2016 1136   GLUCOSE 101 (H) 09/01/2014 1359   BUN 20 06/01/2016 1136   BUN 23 (H) 09/01/2014 1359   CREATININE 1.11 06/01/2016 1136   CREATININE 1.28 (H) 09/01/2014 1359   CALCIUM 9.7 06/01/2016 1136   CALCIUM 8.8 (L) 09/01/2014 1359   GFRNONAA 41 (L) 03/10/2016 1320   GFRNONAA 44 (L) 09/01/2014 1359   GFRAA 48 (L) 03/10/2016 1320   GFRAA 51 (L) 09/01/2014 1359   CrCl cannot be calculated (Patient's most recent lab result is older than the maximum 21 days allowed.).  COAG Lab Results  Component Value Date   INR 1.0 12/12/2012    Radiology No results found.  Assessment/Plan 1. Transient cerebral ischemia, unspecified type Continue antiplatelet therapy as ordered  2. Bilateral carotid artery stenosis Recommend:  Given the patient's asymptomatic subcritical stenosis no further invasive testing or surgery at this time.  Duplex ultrasound shows <30% stenosis bilaterally.  Continue antiplatelet therapy as prescribed Continue management of CAD, HTN and Hyperlipidemia Healthy heart diet,  encouraged exercise at least 4 times per week Follow up in PRN based on <30% stenosis of the bilateral carotid artery   3. Essential hypertension Continue antihypertensive medications as already ordered, these medications have been reviewed and there are no changes at this time.  4. Hypercholesterolemia Continue statin as ordered and reviewed, no changes at this time  Hortencia Pilar, MD  07/19/2016 12:49 PM

## 2016-07-23 ENCOUNTER — Encounter: Payer: Self-pay | Admitting: Internal Medicine

## 2016-07-23 NOTE — Assessment & Plan Note (Signed)
Controlled on omeprazole.   

## 2016-07-23 NOTE — Assessment & Plan Note (Signed)
Low carb diet and exercise.  Follow met b and a1c.   

## 2016-07-23 NOTE — Assessment & Plan Note (Signed)
Stable.  Doing better.  Trazodone to help her sleep.  Not using xanax often.  Follow.

## 2016-07-23 NOTE — Assessment & Plan Note (Signed)
Blood pressure under good control.  Continue same medication regimen.  Follow pressures.  Follow metabolic panel.   

## 2016-07-23 NOTE — Assessment & Plan Note (Signed)
Low cholesterol diet and exercise.  Follow lipid panel.   

## 2016-07-23 NOTE — Assessment & Plan Note (Signed)
Due f/u with [facility].  Scheduled to see them in two days.  Follow.

## 2016-07-23 NOTE — Assessment & Plan Note (Signed)
Stay hydrated.  Creatinine last checked - 1.11.

## 2016-07-25 ENCOUNTER — Other Ambulatory Visit: Payer: Self-pay | Admitting: Nurse Practitioner

## 2016-07-25 DIAGNOSIS — R200 Anesthesia of skin: Secondary | ICD-10-CM

## 2016-08-01 ENCOUNTER — Telehealth: Payer: Self-pay | Admitting: Internal Medicine

## 2016-08-01 MED ORDER — ALPRAZOLAM 0.5 MG PO TABS: 0.5000 mg | ORAL_TABLET | Freq: Every day | ORAL | 0 refills | Status: DC | PRN

## 2016-08-01 NOTE — Telephone Encounter (Signed)
rx ok'd for xanax #30 with no refills.

## 2016-08-01 NOTE — Telephone Encounter (Signed)
Last o/v 07-16-16 Last refill 04-17-16 #60 no refill Next f/u 10-31-16

## 2016-08-01 NOTE — Telephone Encounter (Signed)
Pt called requesting a refill on her ALPRAZolam (XANAX) 0.5 MG tablet. Please advise, thank you!  Joseph, [place]  Call pt @ [phone]

## 2016-08-06 ENCOUNTER — Ambulatory Visit: Payer: Medicare HMO

## 2016-08-06 NOTE — Telephone Encounter (Signed)
Called into pharmacy

## 2016-08-06 NOTE — Telephone Encounter (Signed)
Pt called [facility] they stated they don't have the rx. Please advise?  Call pt @ [phone] or cell [phone]. Thank you!

## 2016-08-07 ENCOUNTER — Ambulatory Visit: Payer: Medicare HMO | Attending: Pulmonary Disease

## 2016-08-07 ENCOUNTER — Ambulatory Visit
Admission: RE | Admit: 2016-08-07 | Discharge: 2016-08-07 | Disposition: A | Discharge status: Home / Self Care | Payer: Medicare HMO | Source: Ambulatory Visit | Attending: Nurse Practitioner | Admitting: Nurse Practitioner

## 2016-08-07 DIAGNOSIS — R200 Anesthesia of skin: Secondary | ICD-10-CM | POA: Diagnosis not present

## 2016-08-07 DIAGNOSIS — G3189 Other specified degenerative diseases of nervous system: Secondary | ICD-10-CM | POA: Insufficient documentation

## 2016-08-07 DIAGNOSIS — J449 Chronic obstructive pulmonary disease, unspecified: Secondary | ICD-10-CM | POA: Diagnosis not present

## 2016-08-07 DIAGNOSIS — R0600 Dyspnea, unspecified: Secondary | ICD-10-CM | POA: Insufficient documentation

## 2016-08-07 DIAGNOSIS — R0609 Other forms of dyspnea: Secondary | ICD-10-CM | POA: Diagnosis not present

## 2016-08-09 DIAGNOSIS — G20 Parkinson's disease: Secondary | ICD-10-CM

## 2016-08-09 DIAGNOSIS — G20A1 Parkinson's disease without dyskinesia, without mention of fluctuations: Secondary | ICD-10-CM

## 2016-08-09 HISTORY — DX: Parkinson's disease without dyskinesia, without mention of fluctuations: G20.A1

## 2016-08-09 HISTORY — DX: Parkinson's disease: G20

## 2016-08-23 ENCOUNTER — Encounter: Payer: Self-pay | Admitting: Hematology and Oncology

## 2016-08-23 ENCOUNTER — Inpatient Hospital Stay: Payer: Medicare HMO | Attending: Hematology and Oncology

## 2016-08-23 ENCOUNTER — Other Ambulatory Visit: Payer: Self-pay | Admitting: *Deleted

## 2016-08-23 ENCOUNTER — Ambulatory Visit: Payer: Medicare HMO | Admitting: Pulmonary Disease

## 2016-08-23 ENCOUNTER — Inpatient Hospital Stay (HOSPITAL_BASED_OUTPATIENT_CLINIC_OR_DEPARTMENT_OTHER): Payer: Medicare HMO | Admitting: Hematology and Oncology

## 2016-08-23 VITALS — BP 132/82 | HR 86 | Temp 98.5°F | Ht 63.0 in | Wt 192.2 lb

## 2016-08-23 DIAGNOSIS — F329 Major depressive disorder, single episode, unspecified: Secondary | ICD-10-CM | POA: Insufficient documentation

## 2016-08-23 DIAGNOSIS — K219 Gastro-esophageal reflux disease without esophagitis: Secondary | ICD-10-CM | POA: Diagnosis not present

## 2016-08-23 DIAGNOSIS — Z85038 Personal history of other malignant neoplasm of large intestine: Secondary | ICD-10-CM | POA: Diagnosis not present

## 2016-08-23 DIAGNOSIS — Z7982 Long term (current) use of aspirin: Secondary | ICD-10-CM | POA: Insufficient documentation

## 2016-08-23 DIAGNOSIS — G20 Parkinson's disease: Secondary | ICD-10-CM | POA: Insufficient documentation

## 2016-08-23 DIAGNOSIS — I10 Essential (primary) hypertension: Secondary | ICD-10-CM | POA: Insufficient documentation

## 2016-08-23 DIAGNOSIS — Z9221 Personal history of antineoplastic chemotherapy: Secondary | ICD-10-CM | POA: Insufficient documentation

## 2016-08-23 DIAGNOSIS — C189 Malignant neoplasm of colon, unspecified: Secondary | ICD-10-CM

## 2016-08-23 DIAGNOSIS — F419 Anxiety disorder, unspecified: Secondary | ICD-10-CM | POA: Diagnosis not present

## 2016-08-23 DIAGNOSIS — K648 Other hemorrhoids: Secondary | ICD-10-CM | POA: Insufficient documentation

## 2016-08-23 DIAGNOSIS — Z85118 Personal history of other malignant neoplasm of bronchus and lung: Secondary | ICD-10-CM

## 2016-08-23 DIAGNOSIS — Z79899 Other long term (current) drug therapy: Secondary | ICD-10-CM | POA: Diagnosis not present

## 2016-08-23 DIAGNOSIS — R7989 Other specified abnormal findings of blood chemistry: Secondary | ICD-10-CM

## 2016-08-23 DIAGNOSIS — N200 Calculus of kidney: Secondary | ICD-10-CM | POA: Insufficient documentation

## 2016-08-23 DIAGNOSIS — J449 Chronic obstructive pulmonary disease, unspecified: Secondary | ICD-10-CM | POA: Diagnosis not present

## 2016-08-23 DIAGNOSIS — R945 Abnormal results of liver function studies: Secondary | ICD-10-CM

## 2016-08-23 DIAGNOSIS — E7800 Pure hypercholesterolemia, unspecified: Secondary | ICD-10-CM | POA: Diagnosis not present

## 2016-08-23 DIAGNOSIS — C7801 Secondary malignant neoplasm of right lung: Secondary | ICD-10-CM

## 2016-08-23 LAB — CBC WITH DIFFERENTIAL/PLATELET
Basophils Absolute: 0.1 10*3/uL (ref 0–0.1)
Basophils Relative: 1 %
Eosinophils Absolute: 0.1 10*3/uL (ref 0–0.7)
Eosinophils Relative: 1 %
HCT: 36.1 % (ref 35.0–47.0)
Hemoglobin: 12.3 g/dL (ref 12.0–16.0)
Lymphocytes Relative: 20 %
Lymphs Abs: 1.7 10*3/uL (ref 1.0–3.6)
MCH: 29.1 pg (ref 26.0–34.0)
MCHC: 34.2 g/dL (ref 32.0–36.0)
MCV: 84.9 fL (ref 80.0–100.0)
Monocytes Absolute: 0.4 10*3/uL (ref 0.2–0.9)
Monocytes Relative: 4 %
Neutro Abs: 6.6 10*3/uL — ABNORMAL HIGH (ref 1.4–6.5)
Neutrophils Relative %: 74 %
Platelets: 259 10*3/uL (ref 150–440)
RBC: 4.25 MIL/uL (ref 3.80–5.20)
RDW: 13.8 % (ref 11.5–14.5)
WBC: 8.8 10*3/uL (ref 3.6–11.0)

## 2016-08-23 LAB — COMPREHENSIVE METABOLIC PANEL
ALT: 14 U/L (ref 14–54)
AST: 45 U/L — ABNORMAL HIGH (ref 15–41)
Albumin: 3.8 g/dL (ref 3.5–5.0)
Alkaline Phosphatase: 78 U/L (ref 38–126)
Anion gap: 7 (ref 5–15)
BUN: 18 mg/dL (ref 6–20)
CO2: 25 mmol/L (ref 22–32)
Calcium: 9.4 mg/dL (ref 8.9–10.3)
Chloride: 103 mmol/L (ref 101–111)
Creatinine, Ser: 1.19 mg/dL — ABNORMAL HIGH (ref 0.44–1.00)
GFR calc Af Amer: 54 mL/min — ABNORMAL LOW (ref >60)
GFR calc non Af Amer: 47 mL/min — ABNORMAL LOW (ref >60)
Glucose, Bld: 139 mg/dL — ABNORMAL HIGH (ref 65–99)
Potassium: 4 mmol/L (ref 3.5–5.1)
Sodium: 135 mmol/L (ref 135–145)
Total Bilirubin: 0.4 mg/dL (ref 0.3–1.2)
Total Protein: 7.3 g/dL (ref 6.5–8.1)

## 2016-08-23 NOTE — Progress Notes (Signed)
[facility]-  Cancer Center  Clinic day:  08/23/2016  Chief Complaint: Victoria Hanson is a 66 y.o. female with metastatic colon cancer who is seen for 6 month assessment.  HPI:  The patient was last seen in the medical oncology clinic on 02/23/2016.  At that time, she was seen for initial assessment by me.  Symptomatically, she felt fine.  She had chronic intermittent nausea related to eating.  We discussed follow-up imaging.  Chest, abdomen, and pelvic CT scan on 03/01/2016 revealed no evidence of recurrent or metastatic carcinoma.  There was a stable small periumbilical and suprapubic ventral abdominal wall hernia containing fat.  Symptomatically, she notes a recent diagnosis of Parkinson's disease.  She denies any issues with motor skills.  She denies any abdominal concerns.   Past Medical History:  Diagnosis Date  . Anxiety   . Atypical chest pain    a. 2003 Cath: reportedly nl per pt [name]);  b. 04/2015 [facility] Admission, neg troponin.  . Colon cancer (HCC)    a. 1999: Pt had partial colectomy. Did develop lung metastasis. Had right upper lobectomy in 01/2007 then had associated chemotherapy.  Marland Kitchen COPD (chronic obstructive pulmonary disease) (HCC)   . DDD (degenerative disc disease), lumbosacral   . Depression   . Echocardiogram abnormal    a. 02/2010 Echo: EF 55-60%, mild LAE, no regional wall motion abnormalities  . Elevated transaminase level    a. ? NASH;  b. 06/2015 - nl LFTs.  . GERD (gastroesophageal reflux disease)   . Hematuria   . Holter monitor, abnormal    a. 12/2009 Holter monitoring: NSR, rare PACs and PVCs.  Marland Kitchen Hypercholesterolemia   . Hyperlipidemia   . Hypertension   . Leaky heart valve    a. Per pt report - no evidence of valvular abnormalities on prior echoes.  . Lung cancer (HCC)    a. S/P resection (lobectomy - right)  . Morbid obesity (HCC)   . Nephrolithiasis     Past Surgical History:  Procedure Laterality Date  . BREAST BIOPSY   10/07/2009   Right breast  . CARDIAC CATHETERIZATION  2003   negative as per pt report  . CHOLECYSTECTOMY  2000  . COLON SURGERY     Colon cancer, removed part of large colon  . COLONOSCOPY N/A 04/25/2015   Procedure: COLONOSCOPY;  Surgeon: Scot Jun, MD;  [facility] ENDOSCOPY;  Service: Endoscopy;  Laterality: N/A;  . LOBECTOMY     Right lung, upper lobe right side removed  . TOTAL ABDOMINAL HYSTERECTOMY     abnormal bleeding  . TUBAL LIGATION  1980    Family History  Problem Relation Age of Onset  . Coronary artery disease Mother     died @ 49 of MI.  . Mental illness Mother   . Diabetes Mother   . Heart attack Father 20    MI  . Cancer Father     lung - died in his mid-70's  . Hypertension Father   . Diabetes Father   . Sudden death Brother   . Mental illness Brother   . Heart attack Brother     MI @ 54, died @ age 42.  . Breast cancer Neg Hx     Social History:  reports that she is a non-smoker but has been exposed to tobacco smoke. She has never used smokeless tobacco. She reports that she drinks about 0.6 oz of alcohol per week . She reports that she does  not use drugs.  She lives in [place].  The patient is alone today.  Allergies:  Allergies  Allergen Reactions  . Macrobid [Nitrofurantoin Macrocrystal] Shortness Of Breath and Palpitations  . Antihistamines, Diphenhydramine-Type Other (See Comments)    She does not know which antihistamine she has an allergic reaction to.  . Aspirin Other (See Comments)    Makes her sick on the stomach if she takes too many and causes bleeding.  Of note, she can take ibuprofen.  . Ceftin [Cefuroxime Axetil] Other (See Comments)    Chest pains  . Celecoxib Other (See Comments)    Reaction: Blood Disorder GI bleeding.  Marland Kitchen Cymbalta [Duloxetine Hcl] Other (See Comments)    Reaction: unknown  . Diphenhydramine Other (See Comments)  . Escitalopram Other (See Comments)  . Lexapro [Escitalopram Oxalate] Other (See  Comments)    Reaction: Difficulty with speech  . Metronidazole Other (See Comments)    Other reaction(s): Unknown  . Zoloft [Sertraline Hcl]   . Ciprofloxacin Other (See Comments)    Reaction: unknown    Current Medications: Current Outpatient Prescriptions  Medication Sig Dispense Refill  . albuterol (PROVENTIL HFA;VENTOLIN HFA) 108 (90 Base) MCG/ACT inhaler Inhale 2 puffs into the lungs every 6 (six) hours as needed for wheezing. 1 Inhaler 2  . alendronate (FOSAMAX) 70 MG tablet Take 1 tablet (70 mg total) by mouth every 7 (seven) days. Take with a full glass of water on an empty stomach. 4 tablet 11  . ALPRAZolam (XANAX) 0.5 MG tablet Take 1 tablet (0.5 mg total) by mouth daily as needed for anxiety. 30 tablet 0  . aspirin EC 81 MG tablet Take 81 mg by mouth daily.    . carbidopa-levodopa (SINEMET IR) 25-100 MG tablet Take by mouth.    . Multiple Vitamin (MULTIVITAMIN) tablet Take 1 tablet by mouth daily.    Marland Kitchen omeprazole (PRILOSEC) 20 MG capsule Take 20 mg by mouth daily.    . potassium chloride SA (K-DUR,KLOR-CON) 20 MEQ tablet Take 1 tablet (20 mEq total) by mouth 2 (two) times daily. 60 tablet 2  . traZODone (DESYREL) 50 MG tablet TAKE ONE AND ONE-HALF TABLET BY MOUTH ATBEDTIME 45 tablet 1  . triamterene-hydrochlorothiazide (MAXZIDE-25) 37.5-25 MG tablet Take 0.5 tablets by mouth daily. 30 tablet 1   No current facility-administered medications for this visit.     Review of Systems:  GENERAL:  Feels "ok".  No fevers or sweats.  Weight up 3 pounds. PERFORMANCE STATUS (ECOG):  1 HEENT:  No visual changes, runny nose, sore throat, mouth sores or tenderness. Lungs: COPD.  Shortness of breath with exertion.  No cough.  No hemoptysis. Cardiac:  No chest pain, palpitations, orthopnea, or PND. GI:  Nausea, intermittent.  No vomiting, diarrhea, constipation, melena or hematochezia.  Colonoscopy 04/2015. GU:  No urgency, frequency, dysuria, or hematuria. Musculoskeletal:  No back pain.   No joint pain.  No muscle tenderness. Extremities:  No pain or swelling. Skin:  No rashes or skin changes. Neuro:  Parkinson's disease.  No headache, numbness or weakness, balance or coordination issues. Endocrine:  No diabetes, thyroid issues, hot flashes or night sweats. Psych:  Stress at home.  Worried about son.  No mood changes, depression or anxiety. Pain:  No focal pain. Review of systems:  All other systems reviewed and found to be negative.  Physical Exam: Blood pressure 132/82, pulse 86, temperature 98.5 F (36.9 C), temperature source Tympanic, height 5\' 3"  (1.6 m), weight 192 lb 3.9 oz (87.2  kg). GENERAL:  Well developed, well nourished, woman sitting comfortably in the exam room in no acute distress. MENTAL STATUS:  Alert and oriented to person, place and time. HEAD:  Short gray hair.  Normocephalic, atraumatic, face symmetric, no Cushingoid features. EYES:  Glasses.  Pupils equal round and reactive to light and accomodation.  No conjunctivitis or scleral icterus. ENT:  Oropharynx clear without lesion.  Tongue normal. Mucous membranes moist.  RESPIRATORY:  Clear to auscultation without rales, wheezes or rhonchi. CARDIOVASCULAR:  Regular rate and rhythm without murmur, rub or gallop. ABDOMEN:  Soft, non-tender, with active bowel sounds, and no appreciable hepatosplenomegaly.  No masses. SKIN:  Bruising on upper extremities.  No rashes, ulcers or lesions. EXTREMITIES: No edema, no skin discoloration or tenderness.  No palpable cords. LYMPH NODES: No palpable cervical, supraclavicular, axillary or inguinal adenopathy  NEUROLOGICAL: Unremarkable. PSYCH:  Appropriate.   Appointment on 08/23/2016  Component Date Value Ref Range Status  . WBC 08/23/2016 8.8  3.6 - 11.0 K/uL Final  . RBC 08/23/2016 4.25  3.80 - 5.20 MIL/uL Final  . Hemoglobin 08/23/2016 12.3  12.0 - 16.0 g/dL Final  . HCT 33/61/4596 36.1  35.0 - 47.0 % Final  . MCV 08/23/2016 84.9  80.0 - 100.0 fL Final  . MCH  08/23/2016 29.1  26.0 - 34.0 pg Final  . MCHC 08/23/2016 34.2  32.0 - 36.0 g/dL Final  . RDW 80/87/4921 13.8  11.5 - 14.5 % Final  . Platelets 08/23/2016 259  150 - 440 K/uL Final  . Neutrophils Relative % 08/23/2016 74  % Final  . Neutro Abs 08/23/2016 6.6* 1.4 - 6.5 K/uL Final  . Lymphocytes Relative 08/23/2016 20  % Final  . Lymphs Abs 08/23/2016 1.7  1.0 - 3.6 K/uL Final  . Monocytes Relative 08/23/2016 4  % Final  . Monocytes Absolute 08/23/2016 0.4  0.2 - 0.9 K/uL Final  . Eosinophils Relative 08/23/2016 1  % Final  . Eosinophils Absolute 08/23/2016 0.1  0 - 0.7 K/uL Final  . Basophils Relative 08/23/2016 1  % Final  . Basophils Absolute 08/23/2016 0.1  0 - 0.1 K/uL Final  . Sodium 08/23/2016 135  135 - 145 mmol/L Final  . Potassium 08/23/2016 4.0  3.5 - 5.1 mmol/L Final  . Chloride 08/23/2016 103  101 - 111 mmol/L Final  . CO2 08/23/2016 25  22 - 32 mmol/L Final  . Glucose, Bld 08/23/2016 139* 65 - 99 mg/dL Final  . BUN 04/07/3913 18  6 - 20 mg/dL Final  . Creatinine, Ser 08/23/2016 1.19* 0.44 - 1.00 mg/dL Final  . Calcium 15/69/5247 9.4  8.9 - 10.3 mg/dL Final  . Total Protein 08/23/2016 7.3  6.5 - 8.1 g/dL Final  . Albumin 41/27/3755 3.8  3.5 - 5.0 g/dL Final  . AST 36/31/8666 45* 15 - 41 U/L Final  . ALT 08/23/2016 14  14 - 54 U/L Final  . Alkaline Phosphatase 08/23/2016 78  38 - 126 U/L Final  . Total Bilirubin 08/23/2016 0.4  0.3 - 1.2 mg/dL Final  . GFR calc non Af Amer 08/23/2016 47* >60 mL/min Final  . GFR calc Af Amer 08/23/2016 54* >60 mL/min Final   Comment: (NOTE) The eGFR has been calculated using the CKD EPI equation. This calculation has not been validated in all clinical situations. eGFR's persistently <60 mL/min signify possible Chronic Kidney Disease.   . Anion gap 08/23/2016 7  5 - 15 Final    Assessment:  Victoria Hanson is  a 66 y.o. female with metastatic colon cancer s/p partial colectomy in 10/1998.   Pathologic stage was [id number] (no details are  available).  She did not receive chemotherapy.  Imaging studies revealed a right lung lesion.  She underwent right upper lobe lobectomy in 01/17/2007.  Pathology confirmed metastatic colon cancer.  She received 6 months of FOLFOX chemotherapy (completed 08/2007).  Course was truncated secondary to neuropathy.  Patient notes recurrence was not associated with an elevated CEA.  Abdomen and pelvic CT on 02/16/2015 revealed no evidence of metastatic disease.  Chest, abdomen, and pelvic CT on 03/01/2016 revealed no evidence of recurrent or metastatic carcinoma.  Colonoscopy on 04/25/2015 revealed only internal hemorrhoids.  CXR on 11/07/2015 revealed minimal bibasilar atelectasis.  Head MRI on 09/01/2015 was normal.  CEA has been followed: 0.8 on 12/26/2011, 0.6 on 10/29/2012, 0.9 on 10/26/2013, 0.8 on 05/17/2015, and 1.1 on 08/23/2016.   Symptomatically, she was recently diagnosed with Parkinson's disease.  She is concerned about her son.  Exam is stable.  AST is 45 (minimally elevated).  Plan: 1.  Labs today:  CBC with diff, CMP, CEA. 2.  Review interval CT scans.  No evidence of recurrent disease. 3.  Discuss today's labs. Discuss minimally elevated AST.  Given patient's concern, will repeat in 1 month. 4.  RTC in 1 month for labs (liver function tests) 5.  RTC in 6 months for MD assessment and labs (CBC with diff, CMP, CEA).   Lequita Asal, MD  08/23/2016, 3:51 PM

## 2016-08-23 NOTE — Progress Notes (Signed)
Patient here for follow up. She has recently been diagnosed with Parkison's Disease.

## 2016-08-24 ENCOUNTER — Other Ambulatory Visit: Payer: Self-pay | Admitting: *Deleted

## 2016-08-24 DIAGNOSIS — C189 Malignant neoplasm of colon, unspecified: Secondary | ICD-10-CM

## 2016-08-24 LAB — CEA: CEA: 1.1 ng/mL (ref 0.0–4.7)

## 2016-08-25 DIAGNOSIS — R7989 Other specified abnormal findings of blood chemistry: Secondary | ICD-10-CM | POA: Insufficient documentation

## 2016-08-25 DIAGNOSIS — R945 Abnormal results of liver function studies: Secondary | ICD-10-CM

## 2016-09-10 ENCOUNTER — Ambulatory Visit (INDEPENDENT_AMBULATORY_CARE_PROVIDER_SITE_OTHER): Payer: Medicare HMO | Admitting: Family

## 2016-09-10 ENCOUNTER — Encounter: Payer: Self-pay | Admitting: Family

## 2016-09-10 VITALS — BP 138/70 | HR 73 | Temp 97.8°F | Ht 63.0 in | Wt 194.4 lb

## 2016-09-10 DIAGNOSIS — R350 Frequency of micturition: Secondary | ICD-10-CM | POA: Diagnosis not present

## 2016-09-10 DIAGNOSIS — S61412A Laceration without foreign body of left hand, initial encounter: Secondary | ICD-10-CM | POA: Diagnosis not present

## 2016-09-10 DIAGNOSIS — R8290 Unspecified abnormal findings in urine: Secondary | ICD-10-CM | POA: Diagnosis not present

## 2016-09-10 DIAGNOSIS — R197 Diarrhea, unspecified: Secondary | ICD-10-CM

## 2016-09-10 LAB — URINALYSIS, MICROSCOPIC ONLY: RBC / HPF: NONE SEEN (ref 0–?)

## 2016-09-10 LAB — POCT URINALYSIS DIP (MANUAL ENTRY)
BILIRUBIN UA: NEGATIVE
GLUCOSE UA: NEGATIVE
Ketones, POC UA: NEGATIVE
Nitrite, UA: NEGATIVE
Protein Ur, POC: NEGATIVE
Spec Grav, UA: 1.005 (ref 1.030–1.035)
Urobilinogen, UA: 0.2 (ref ?–2.0)
pH, UA: 5 (ref 5.0–8.0)
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
## 2016-09-10 NOTE — Patient Instructions (Signed)
Great job in taking care of your skin tear. They do appear to be healing and no signs of infection  Stool culture  Advised to hold anti diarrhea medication and follow a bland diet.   Let me know if this does not improve.

## 2016-09-10 NOTE — Progress Notes (Signed)
Pre visit review using our clinic review tool, if applicable. No additional management support is needed unless otherwise documented below in the visit note. 

## 2016-09-11 LAB — COMPREHENSIVE METABOLIC PANEL
ALT: 4 U/L (ref 0–35)
AST: 15 U/L (ref 0–37)
Albumin: 3.7 g/dL (ref 3.5–5.2)
Alkaline Phosphatase: 63 U/L (ref 39–117)
BUN: 16 mg/dL (ref 6–23)
CALCIUM: 9.4 mg/dL (ref 8.4–10.5)
CHLORIDE: 103 meq/L (ref 96–112)
CO2: 27 meq/L (ref 19–32)
CREATININE: 1.19 mg/dL (ref 0.40–1.20)
GFR: 48.12 mL/min — ABNORMAL LOW (ref >60.00)
GLUCOSE: 103 mg/dL — AB (ref 70–99)
Potassium: 3.8 mEq/L (ref 3.5–5.1)
SODIUM: 137 meq/L (ref 135–145)
Total Bilirubin: 0.3 mg/dL (ref 0.2–1.2)
Total Protein: 6.8 g/dL (ref 6.0–8.3)

## 2016-09-11 LAB — URINE CULTURE: Organism ID, Bacteria: NO GROWTH

## 2016-09-13 ENCOUNTER — Other Ambulatory Visit: Payer: Medicare HMO

## 2016-09-13 DIAGNOSIS — R197 Diarrhea, unspecified: Secondary | ICD-10-CM

## 2016-09-14 ENCOUNTER — Telehealth: Payer: Self-pay | Admitting: Internal Medicine

## 2016-09-14 NOTE — Telephone Encounter (Signed)
Please see result note 

## 2016-09-14 NOTE — Telephone Encounter (Signed)
Pt called returning your call. Thank you!  Call pt @ [phone] (may leave msg) or [phone]

## 2016-09-16 LAB — STOOL CULTURE

## 2016-09-21 ENCOUNTER — Telehealth: Payer: Self-pay | Admitting: Cardiovascular Disease

## 2016-09-21 DIAGNOSIS — R002 Palpitations: Secondary | ICD-10-CM | POA: Diagnosis not present

## 2016-09-21 DIAGNOSIS — F339 Major depressive disorder, recurrent, unspecified: Secondary | ICD-10-CM | POA: Diagnosis not present

## 2016-09-21 DIAGNOSIS — R51 Headache: Secondary | ICD-10-CM | POA: Diagnosis not present

## 2016-09-21 DIAGNOSIS — R251 Tremor, unspecified: Secondary | ICD-10-CM | POA: Diagnosis not present

## 2016-09-21 DIAGNOSIS — R200 Anesthesia of skin: Secondary | ICD-10-CM | POA: Diagnosis not present

## 2016-09-21 DIAGNOSIS — R259 Unspecified abnormal involuntary movements: Secondary | ICD-10-CM | POA: Diagnosis not present

## 2016-09-21 NOTE — Telephone Encounter (Signed)
Patient says [facility] neuro ordered a holter monitor 48 hrs to be done but patient would rather come to [place] s office.  Per patient she requested orders be sent here to [facility]

## 2016-09-24 ENCOUNTER — Other Ambulatory Visit: Payer: Self-pay

## 2016-09-24 ENCOUNTER — Encounter: Payer: Self-pay | Admitting: Emergency Medicine

## 2016-09-24 ENCOUNTER — Emergency Department: Payer: Medicare HMO

## 2016-09-24 ENCOUNTER — Emergency Department
Admission: EM | Admit: 2016-09-24 | Discharge: 2016-09-24 | Disposition: A | Discharge status: Home / Self Care | Payer: Medicare HMO | Attending: Emergency Medicine | Admitting: Emergency Medicine

## 2016-09-24 ENCOUNTER — Inpatient Hospital Stay: Payer: Medicare HMO | Attending: Hematology and Oncology

## 2016-09-24 DIAGNOSIS — N200 Calculus of kidney: Secondary | ICD-10-CM | POA: Insufficient documentation

## 2016-09-24 DIAGNOSIS — J449 Chronic obstructive pulmonary disease, unspecified: Secondary | ICD-10-CM | POA: Insufficient documentation

## 2016-09-24 DIAGNOSIS — I129 Hypertensive chronic kidney disease with stage 1 through stage 4 chronic kidney disease, or unspecified chronic kidney disease: Secondary | ICD-10-CM | POA: Insufficient documentation

## 2016-09-24 DIAGNOSIS — Z85038 Personal history of other malignant neoplasm of large intestine: Secondary | ICD-10-CM | POA: Diagnosis not present

## 2016-09-24 DIAGNOSIS — Z79899 Other long term (current) drug therapy: Secondary | ICD-10-CM | POA: Insufficient documentation

## 2016-09-24 DIAGNOSIS — R002 Palpitations: Secondary | ICD-10-CM

## 2016-09-24 DIAGNOSIS — F329 Major depressive disorder, single episode, unspecified: Secondary | ICD-10-CM | POA: Insufficient documentation

## 2016-09-24 DIAGNOSIS — R609 Edema, unspecified: Secondary | ICD-10-CM

## 2016-09-24 DIAGNOSIS — K219 Gastro-esophageal reflux disease without esophagitis: Secondary | ICD-10-CM | POA: Diagnosis not present

## 2016-09-24 DIAGNOSIS — N183 Chronic kidney disease, stage 3 (moderate): Secondary | ICD-10-CM | POA: Diagnosis not present

## 2016-09-24 DIAGNOSIS — Z7722 Contact with and (suspected) exposure to environmental tobacco smoke (acute) (chronic): Secondary | ICD-10-CM | POA: Diagnosis not present

## 2016-09-24 DIAGNOSIS — Z7982 Long term (current) use of aspirin: Secondary | ICD-10-CM | POA: Insufficient documentation

## 2016-09-24 DIAGNOSIS — I10 Essential (primary) hypertension: Secondary | ICD-10-CM | POA: Diagnosis not present

## 2016-09-24 DIAGNOSIS — G20 Parkinson's disease: Secondary | ICD-10-CM | POA: Insufficient documentation

## 2016-09-24 DIAGNOSIS — R600 Localized edema: Secondary | ICD-10-CM | POA: Diagnosis not present

## 2016-09-24 DIAGNOSIS — Z9221 Personal history of antineoplastic chemotherapy: Secondary | ICD-10-CM | POA: Insufficient documentation

## 2016-09-24 DIAGNOSIS — Z85118 Personal history of other malignant neoplasm of bronchus and lung: Secondary | ICD-10-CM | POA: Insufficient documentation

## 2016-09-24 DIAGNOSIS — K648 Other hemorrhoids: Secondary | ICD-10-CM | POA: Diagnosis not present

## 2016-09-24 DIAGNOSIS — R05 Cough: Secondary | ICD-10-CM | POA: Diagnosis not present

## 2016-09-24 DIAGNOSIS — C189 Malignant neoplasm of colon, unspecified: Secondary | ICD-10-CM

## 2016-09-24 DIAGNOSIS — F419 Anxiety disorder, unspecified: Secondary | ICD-10-CM | POA: Diagnosis not present

## 2016-09-24 DIAGNOSIS — E7800 Pure hypercholesterolemia, unspecified: Secondary | ICD-10-CM | POA: Insufficient documentation

## 2016-09-24 DIAGNOSIS — R0602 Shortness of breath: Secondary | ICD-10-CM | POA: Diagnosis not present

## 2016-09-24 LAB — CBC
HEMATOCRIT: 36.6 % (ref 35.0–47.0)
Hemoglobin: 12.3 g/dL (ref 12.0–16.0)
MCH: 28.9 pg (ref 26.0–34.0)
MCHC: 33.7 g/dL (ref 32.0–36.0)
MCV: 85.8 fL (ref 80.0–100.0)
PLATELETS: 245 10*3/uL (ref 150–440)
RBC: 4.27 MIL/uL (ref 3.80–5.20)
RDW: 13.3 % (ref 11.5–14.5)
WBC: 8.9 10*3/uL (ref 3.6–11.0)

## 2016-09-24 LAB — COMPREHENSIVE METABOLIC PANEL
ALBUMIN: 3.8 g/dL (ref 3.5–5.0)
ALK PHOS: 73 U/L (ref 38–126)
ALT: 14 U/L (ref 14–54)
AST: 27 U/L (ref 15–41)
Anion gap: 8 (ref 5–15)
BILIRUBIN TOTAL: 0.3 mg/dL (ref 0.3–1.2)
BUN: 16 mg/dL (ref 6–20)
CALCIUM: 9.3 mg/dL (ref 8.9–10.3)
CO2: 26 mmol/L (ref 22–32)
CREATININE: 1.18 mg/dL — AB (ref 0.44–1.00)
Chloride: 102 mmol/L (ref 101–111)
GFR calc non Af Amer: 47 mL/min — ABNORMAL LOW (ref >60)
GFR, EST AFRICAN AMERICAN: 54 mL/min — AB (ref >60)
GLUCOSE: 106 mg/dL — AB (ref 65–99)
Potassium: 4.2 mmol/L (ref 3.5–5.1)
SODIUM: 136 mmol/L (ref 135–145)
TOTAL PROTEIN: 7 g/dL (ref 6.5–8.1)

## 2016-09-24 LAB — HEPATIC FUNCTION PANEL
ALT: 7 U/L — ABNORMAL LOW (ref 14–54)
AST: 24 U/L (ref 15–41)
Albumin: 3.7 g/dL (ref 3.5–5.0)
Alkaline Phosphatase: 69 U/L (ref 38–126)
Bilirubin, Direct: <0.1 mg/dL — ABNORMAL LOW (ref 0.1–0.5)
Total Bilirubin: 0.5 mg/dL (ref 0.3–1.2)
Total Protein: 7.1 g/dL (ref 6.5–8.1)

## 2016-09-24 LAB — TROPONIN I: Troponin I: <0.03 ng/mL (ref <0.03)

## 2016-09-24 LAB — BRAIN NATRIURETIC PEPTIDE: B Natriuretic Peptide: 135 pg/mL — ABNORMAL HIGH (ref 0.0–100.0)

## 2016-09-24 NOTE — Discharge Instructions (Signed)
Increase your triamterene/HCTZ to 1 tablet daily. Follow up with primary care in 1 week to recheck your blood pressure.  Make an appointment with cardiology tomorrow.

## 2016-09-24 NOTE — Telephone Encounter (Signed)
Pt reports palpitations and leg, feet and ankle swelling x 2 days at 4/13 [facility] neuro appt. Per [place] notes: "4.Heart palpitations in a patient with history of leaky valve, abnormal Echo and Holter monitor in 2011 - Will order Holter monitor and send to cardiology [place] for evaluation"  She was scheduled to have monitor placed at [facility] but would rather have it here stating she will have to pay a copay at [facility].. July 2017 OV with Dr. Fletcher Anon: HM ordered for palpitations. Results showed rare PAC/PVCs.  Oct. 2017 OV notes indicate palpitations possibly triggered by anxiety.  Will route to MD for advice regarding HM orders.

## 2016-09-24 NOTE — Telephone Encounter (Signed)
Dr. Fletcher Anon agreeable w/48 hour holter monitor. Order placed. Called pt back to schedule. No answer, no VM

## 2016-09-24 NOTE — ED Notes (Signed)
Pt states bilat feet swelling x week. States swelling goes down at night while she's asleep but swells up again during day. Pt states she feels like her heart is having palpitations. Saw [facility] recently and was told she would get a heart monitor but her doctor is with [facility] so she states "they were taking too long to get that monitor." Pt alert, oriented, ambulatory, no distress noted. Pt denies CHF, states she takes a pill for BP that has fluid pill in it, but "I only take half of it."

## 2016-09-24 NOTE — Telephone Encounter (Signed)
Attempted to contact pt regarding request for holter to be placed in our office. 48 hour holter monitor ordered by [facility] neuro. Gentleman who answered phone states she is not in. Will call again.

## 2016-09-24 NOTE — Telephone Encounter (Signed)
Left message on machine for patient to contact the office.   

## 2016-09-24 NOTE — ED Provider Notes (Signed)
[facility] Emergency Department Provider Note  ____________________________________________  Time seen: Approximately 6:35 PM  I have reviewed the triage vital signs and the nursing notes.   HISTORY  Chief Complaint Palpitations and Leg Swelling    HPI Victoria Hanson is a 66 y.o. female who complains of subacute gradual onset peripheral edema. Symmetric. Better in the morning, worsens Route the day when she is upright and then improves when her legs are elevated. No leg pain. No fevers chills or chest pain. Denies shortness of breath. Denies is now on exertion or decreased exercise tolerance. Denies PND or orthopnea. He sleeps on 2 thin pillows as usual. She was at neurology clinic recently and they referred her to cardiology for the peripheral edema. She called the cardiology clinic today, but they have not called her back and so she came to the ED.    She also complains of palpitations feeling of fluttering in the left chest which lasts for less than a second at a time. When this is occurring, She reports that her symptoms improve whenever she uses albuterol..     Past Medical History:  Diagnosis Date  . Anxiety   . Atypical chest pain    a. 2003 Cath: reportedly nl per pt [name]);  b. 04/2015 [place] Admission, neg troponin.  . Colon cancer ([place])    a. 1999: Pt had partial colectomy. Did develop lung metastasis. Had right upper lobectomy in 01/2007 then had associated chemotherapy.  Marland Kitchen COPD (chronic obstructive pulmonary disease) ([place])   . DDD (degenerative disc disease), lumbosacral   . Depression   . Echocardiogram abnormal    a. 02/2010 Echo: EF 55-60%, mild LAE, no regional wall motion abnormalities  . Elevated transaminase level    a. ? NASH;  b. 06/2015 - nl LFTs.  . GERD (gastroesophageal reflux disease)   . Hematuria   . Holter monitor, abnormal    a. 12/2009 Holter monitoring: NSR, rare PACs and PVCs.  Marland Kitchen Hypercholesterolemia   .  Hyperlipidemia   . Hypertension   . Leaky heart valve    a. Per pt report - no evidence of valvular abnormalities on prior echoes.  . Lung cancer ([place])    a. S/P resection (lobectomy - right)  . Morbid obesity ([place])   . Nephrolithiasis      Patient Active Problem List   Diagnosis Date Noted  . Elevated liver function tests 08/25/2016  . Carotid stenosis 07/19/2016  . Tremor 06/01/2016  . Osteoporosis 04/08/2016  . Sinus pressure 08/03/2015  . Foot pain, bilateral 07/24/2015  . Hyperlipidemia 06/21/2015  . Atypical chest pain 06/21/2015  . Other fatigue 06/21/2015  . Morbid obesity due to excess calories ([place]) 06/21/2015  . Morbid obesity ([place])   . Abnormal mammogram 06/19/2015  . Urinary system disease 03/28/2015  . Hyperglycemia 03/20/2015  . Right lower quadrant abdominal pain 02/03/2015  . Swelling of lower extremity 11/20/2014  . H/O malignant neoplasm of colon 11/19/2014  . Dysuria 10/24/2014  . Flatulence, eructation and gas pain 10/24/2014  . Health care maintenance 07/17/2014  . Hypokalemia 04/05/2013  . TIA (transient ischemic attack) 02/09/2013  . Temporary cerebral vascular dysfunction 02/09/2013  . Chest pain at rest 05/07/2012  . Anxiety, mild 03/13/2012  . Depression, major, recurrent ([place]) 03/13/2012  . Colon cancer ([employer]) 03/13/2012  . Hypertension 03/13/2012  . CKD (chronic kidney disease), stage III 03/13/2012  . GERD (gastroesophageal reflux disease) 03/13/2012  . Acid reflux 03/13/2012  . Essential (primary) hypertension  03/13/2012  . Lung metastasis ([place]) 03/13/2012  . Major depressive disorder with single episode 03/13/2012  . Malignant neoplasm of colon ([place]) 03/13/2012  . Hypercholesterolemia 02/02/2010  . Pure hypercholesterolemia 02/02/2010     Past Surgical History:  Procedure Laterality Date  . BREAST BIOPSY  10/07/2009   Right breast  . CARDIAC CATHETERIZATION  2003   negative as per pt report  . CHOLECYSTECTOMY  2000  . COLON  SURGERY     Colon cancer, removed part of large colon  . COLONOSCOPY N/A 04/25/2015   Procedure: COLONOSCOPY;  Surgeon: Manya Silvas, MD;  Location: [facility] ENDOSCOPY;  Service: Endoscopy;  Laterality: N/A;  . LOBECTOMY     Right lung, upper lobe right side removed  . TOTAL ABDOMINAL HYSTERECTOMY     abnormal bleeding  . TUBAL LIGATION  1980     Prior to Admission medications   Medication Sig Start Date End Date Taking? Authorizing Provider  albuterol (PROVENTIL HFA;VENTOLIN HFA) 108 (90 Base) MCG/ACT inhaler Inhale 2 puffs into the lungs every 6 (six) hours as needed for wheezing. 07/16/16   Einar Pheasant, MD  alendronate (FOSAMAX) 70 MG tablet Take 1 tablet (70 mg total) by mouth every 7 (seven) days. Take with a full glass of water on an empty stomach. 07/16/16   Einar Pheasant, MD  ALPRAZolam Duanne Moron) 0.5 MG tablet Take 1 tablet (0.5 mg total) by mouth daily as needed for anxiety. 08/01/16   Einar Pheasant, MD  aspirin EC 81 MG tablet Take 81 mg by mouth daily.    Historical Provider, MD  carbidopa-levodopa (SINEMET IR) 25-100 MG tablet Take by mouth. 08/21/16 11/19/16  Historical Provider, MD  Multiple Vitamin (MULTIVITAMIN) tablet Take 1 tablet by mouth daily.    Historical Provider, MD  omeprazole (PRILOSEC) 20 MG capsule Take 20 mg by mouth daily.    Historical Provider, MD  potassium chloride SA (K-DUR,KLOR-CON) 20 MEQ tablet Take 1 tablet (20 mEq total) by mouth 2 (two) times daily. 07/16/16   Einar Pheasant, MD  traZODone (DESYREL) 50 MG tablet TAKE ONE AND ONE-HALF TABLET BY MOUTH ATBEDTIME 07/16/16   Einar Pheasant, MD  triamterene-hydrochlorothiazide (MAXZIDE-25) 37.5-25 MG tablet Take 0.5 tablets by mouth daily. 07/16/16   Einar Pheasant, MD     Allergies Macrobid [nitrofurantoin macrocrystal]; Antihistamines, diphenhydramine-type; Aspirin; Ceftin [cefuroxime axetil]; Celecoxib; Cymbalta [duloxetine hcl]; Diphenhydramine; Escitalopram; Lexapro [escitalopram oxalate]; Metronidazole;  Zoloft [sertraline hcl]; and Ciprofloxacin   Family History  Problem Relation Age of Onset  . Coronary artery disease Mother     died @ 27 of MI.  . Mental illness Mother   . Diabetes Mother   . Heart attack Father 82    MI  . Cancer Father     lung - died in his mid-70's  . Hypertension Father   . Diabetes Father   . Sudden death Brother   . Mental illness Brother   . Heart attack Brother     MI @ 64, died @ age 78.  . Breast cancer Neg Hx     Social History Social History  Substance Use Topics  . Smoking status: Passive Smoke Exposure - Never Smoker  . Smokeless tobacco: Never Used     Comment: husband and sons smoked in her home.  . Alcohol use 0.6 oz/week    1 Glasses of wine per week     Comment: rare wine cooler.    Review of Systems  Constitutional:   No fever or chills.  ENT:  No sore throat. No rhinorrhea. Cardiovascular:   No chest pain.Positive palpitations Respiratory:   No dyspnea or cough. Gastrointestinal:   Negative for abdominal pain, vomiting and diarrhea.  Genitourinary:   Negative for dysuria or difficulty urinating. Musculoskeletal:   Negative for focal pain, positive peripheral edema bilateral lower extremities Neurological:   Negative for headaches 10-point ROS otherwise negative.  ____________________________________________   PHYSICAL EXAM:  VITAL SIGNS: ED Triage Vitals  Enc Vitals Group     BP 09/24/16 1526 (!) 153/66     Pulse Rate 09/24/16 1526 70     Resp 09/24/16 1526 20     Temp 09/24/16 1526 98 F (36.7 C)     Temp Source 09/24/16 1526 Oral     SpO2 09/24/16 1526 100 %     Weight 09/24/16 1533 194 lb (88 kg)     Height 09/24/16 1533 '5\' 3"'$  (1.6 m)     Head Circumference --      Peak Flow --      Pain Score --      Pain Loc --      Pain Edu? --      Excl. in [place]? --     Vital signs reviewed, nursing assessments reviewed.   Constitutional:   Alert and oriented. Well appearing and in no distress. Eyes:   No scleral  icterus. No conjunctival pallor. PERRL. EOMI.  No nystagmus. ENT   Head:   Normocephalic and atraumatic.   Nose:   No congestion/rhinnorhea. No septal hematoma   Mouth/Throat:   MMM, no pharyngeal erythema. No peritonsillar mass.    Neck:   No stridor. No SubQ emphysema. No meningismus.No JVD Hematological/Lymphatic/Immunilogical:   No cervical lymphadenopathy. Cardiovascular:   RRR. Symmetric bilateral radial and DP pulses.  No murmurs.  Respiratory:   Normal respiratory effort without tachypnea nor retractions. Breath sounds are clear and equal bilaterally. No wheezes/rales/rhonchi. No orthopnea on exam Gastrointestinal:   Soft and nontender. Non distended. There is no CVA tenderness.  No rebound, rigidity, or guarding. Genitourinary:   deferred Musculoskeletal:   Normal range of motion in all extremities. No joint effusions.  No lower extremity tenderness.  1+ pitting edema bilateral lower extremities, symmetric. No erythema, nontender.. Neurologic:   Normal speech and language.  CN 2-10 normal. Motor grossly intact. No gross focal neurologic deficits are appreciated.  Skin:    Skin is warm, dry and intact. No rash noted.  No petechiae, purpura, or bullae.  ____________________________________________    LABS (pertinent positives/negatives) (all labs ordered are listed, but only abnormal results are displayed) Labs Reviewed  BRAIN NATRIURETIC PEPTIDE - Abnormal; Notable for the following:       Result Value   B Natriuretic Peptide 135.0 (*)    All other components within normal limits  COMPREHENSIVE METABOLIC PANEL - Abnormal; Notable for the following:    Glucose, Bld 106 (*)    Creatinine, Ser 1.18 (*)    GFR calc non Af Amer 47 (*)    GFR calc Af Amer 54 (*)    All other components within normal limits  CBC  TROPONIN I   ____________________________________________   EKG  Interpreted by me Normal sinus rhythm rate of 75, normal axis intervals QRS ST  segments and T waves  ____________________________________________    RADIOLOGY  Dg Chest 2 View  Result Date: 09/24/2016 CLINICAL DATA:  Shortness of breath, cough, and heart palpitations. Bilateral lower extremity edema. EXAM: CHEST  2 VIEW COMPARISON:  03/10/2016 FINDINGS: Heart  size and pulmonary vascularity are normal. Surgical clips in the right hilum. Chronic scarring at the right lung base laterally. No infiltrates or effusions. No acute bone abnormality. IMPRESSION: No active cardiopulmonary disease. Electronically Signed   By: Lorriane Shire M.D.   On: 09/24/2016 16:32    ____________________________________________   PROCEDURES Procedures  ____________________________________________   INITIAL IMPRESSION / ASSESSMENT AND PLAN / ED COURSE  Pertinent labs & imaging results that were available during my care of the patient were reviewed by me and considered in my medical decision making (see chart for details).  Patient presents with peripheral edema, gravity dependent, without chest pain, dyspnea on exertion, PND, or orthopnea. Exam is reassuring, workup is reassuring. Patient spoke with her cardiology clinic today to schedule outpatient follow-up, encourage her to make an appointment as soon as possible. Increase her triamterene HCTZ to one tablet daily as her blood pressures is not at goal.  Considering the patient's symptoms, medical history, and physical examination today, I have low suspicion for ACS, PE, TAD, pneumothorax, carditis, mediastinitis, pneumonia, CHF, or sepsis.           ____________________________________________   FINAL CLINICAL IMPRESSION(S) / ED DIAGNOSES  Final diagnoses:  Peripheral edema  Palpitations      New Prescriptions   No medications on file     Portions of this note were generated with dragon dictation software. Dictation errors may occur despite best attempts at proofreading.    Carrie Mew, MD 09/24/16  [phone]

## 2016-09-25 NOTE — Telephone Encounter (Signed)
Pt scheduled for 4/18 holter monitor placement.

## 2016-09-26 ENCOUNTER — Ambulatory Visit (INDEPENDENT_AMBULATORY_CARE_PROVIDER_SITE_OTHER): Payer: Medicare HMO

## 2016-09-26 DIAGNOSIS — R002 Palpitations: Secondary | ICD-10-CM | POA: Diagnosis not present

## 2016-10-03 ENCOUNTER — Ambulatory Visit
Admission: RE | Admit: 2016-10-03 | Discharge: 2016-10-03 | Disposition: A | Discharge status: Home / Self Care | Payer: Medicare HMO | Source: Ambulatory Visit | Attending: Cardiovascular Disease | Admitting: Cardiovascular Disease

## 2016-10-03 DIAGNOSIS — I493 Ventricular premature depolarization: Secondary | ICD-10-CM | POA: Diagnosis not present

## 2016-10-03 DIAGNOSIS — R002 Palpitations: Secondary | ICD-10-CM | POA: Insufficient documentation

## 2016-10-03 DIAGNOSIS — I491 Atrial premature depolarization: Secondary | ICD-10-CM | POA: Diagnosis not present

## 2016-10-04 ENCOUNTER — Encounter: Payer: Self-pay | Admitting: Cardiovascular Disease

## 2016-10-04 ENCOUNTER — Ambulatory Visit (INDEPENDENT_AMBULATORY_CARE_PROVIDER_SITE_OTHER): Payer: Medicare HMO | Admitting: Cardiovascular Disease

## 2016-10-04 VITALS — BP 118/74 | HR 72 | Ht 63.0 in | Wt 190.2 lb

## 2016-10-04 DIAGNOSIS — R002 Palpitations: Secondary | ICD-10-CM

## 2016-10-04 DIAGNOSIS — I10 Essential (primary) hypertension: Secondary | ICD-10-CM

## 2016-10-04 NOTE — Patient Instructions (Signed)
Medication Instructions: Continue same medications.   Labwork: None.   Procedures/Testing: None.   Follow-Up: 1 year with Dr. Arida.   Any Additional Special Instructions Will Be Listed Below (If Applicable).     If you need a refill on your cardiac medications before your next appointment, please call your pharmacy.   

## 2016-10-04 NOTE — Progress Notes (Signed)
Cardiology Office Note   Date:  10/04/2016   ID:  Victoria Hanson, Victoria Hanson Mar 20, 1951, MRN [id number]  PCP:  Victoria Pheasant, MD  Cardiologist:   Victoria Sacramento, MD   Chief Complaint  Patient presents with  . other    EDEMA ED fu. Pt c/o sob and palpitations. States she was Dx with parkinson's last month. Reviewed meds with pt verbally.      History of Present Illness: Victoria Hanson is a 66 y.o. female who presents for a follow-up visit regarding atypical chest pain and palpitations. She has multiple chronic medical conditions that include hypertension, hyperlipidemia, family history of heart disease and obesity. She also has history of colon cancer with metastasis to the lungs and is status post partial colectomy, right-sided lobectomy and chemotherapy.  She had cardiac workup for atypical chest pain and palpitations last year. Echocardiogram showed normal LV systolic function with mild biatrial enlargement and mild pulmonary hypertension. A pharmacologic nuclear stress test showed no evidence of ischemia.  Holter monitor showed rare PACs and PVCs.   She went to the emergency room recently for leg edema. The dose of Maxzide was doubled and the edema has improved since then. She complains of worsening palpitations. No chest pain or shortness of breath.  Past Medical History:  Diagnosis Date  . Anxiety   . Atypical chest pain    a. 2003 Cath: reportedly nl per pt Victoria Hanson);  b. 04/2015 [place] Admission, neg troponin.  . Colon cancer ([place])    a. 1999: Pt had partial colectomy. Did develop lung metastasis. Had right upper lobectomy in 01/2007 then had associated chemotherapy.  Marland Kitchen COPD (chronic obstructive pulmonary disease) ([place])   . DDD (degenerative disc disease), lumbosacral   . Depression   . Echocardiogram abnormal    a. 02/2010 Echo: EF 55-60%, mild LAE, no regional wall motion abnormalities  . Elevated transaminase level    a. ? NASH;  b. 06/2015 - nl LFTs.  . GERD  (gastroesophageal reflux disease)   . Hematuria   . Holter monitor, abnormal    a. 12/2009 Holter monitoring: NSR, rare PACs and PVCs.  Marland Kitchen Hypercholesterolemia   . Hyperlipidemia   . Hypertension   . Leaky heart valve    a. Per pt report - no evidence of valvular abnormalities on prior echoes.  . Lung cancer ([place])    a. S/P resection (lobectomy - right)  . Morbid obesity ([place])   . Nephrolithiasis   . Parkinson disease ([place]) 08/2016   Victoria Hanson    Past Surgical History:  Procedure Laterality Date  . BREAST BIOPSY  10/07/2009   Right breast  . CARDIAC CATHETERIZATION  2003   negative as per pt report  . CHOLECYSTECTOMY  2000  . COLON SURGERY     Colon cancer, removed part of large colon  . COLONOSCOPY N/A 04/25/2015   Procedure: COLONOSCOPY;  Surgeon: Victoria Silvas, MD;  Location: [facility] ENDOSCOPY;  Service: Endoscopy;  Laterality: N/A;  . LOBECTOMY     Right lung, upper lobe right side removed  . TOTAL ABDOMINAL HYSTERECTOMY     abnormal bleeding  . TUBAL LIGATION  1980     Current Outpatient Prescriptions  Medication Sig Dispense Refill  . albuterol (PROVENTIL HFA;VENTOLIN HFA) 108 (90 Base) MCG/ACT inhaler Inhale 2 puffs into the lungs every 6 (six) hours as needed for wheezing. 1 Inhaler 2  . alendronate (FOSAMAX) 70 MG tablet Take 1 tablet (70 mg total) by mouth every 7 (seven)  days. Take with a full glass of water on an empty stomach. 4 tablet 11  . ALPRAZolam (XANAX) 0.5 MG tablet Take 1 tablet (0.5 mg total) by mouth daily as needed for anxiety. 30 tablet 0  . aspirin EC 81 MG tablet Take 81 mg by mouth daily.    . carbidopa-levodopa (SINEMET IR) 25-100 MG tablet Take by mouth.    . Multiple Vitamin (MULTIVITAMIN) tablet Take 1 tablet by mouth daily.    Marland Kitchen omeprazole (PRILOSEC) 20 MG capsule Take 20 mg by mouth daily.    . potassium chloride SA (K-DUR,KLOR-CON) 20 MEQ tablet Take 1 tablet (20 mEq total) by mouth 2 (two) times daily. 60 tablet 2  . traZODone (DESYREL)  50 MG tablet TAKE ONE AND ONE-HALF TABLET BY MOUTH ATBEDTIME 45 tablet 1  . triamterene-hydrochlorothiazide (MAXZIDE-25) 37.5-25 MG tablet Take 0.5 tablets by mouth daily. (Patient taking differently: Take 1 tablet by mouth daily. ) 30 tablet 1   No current facility-administered medications for this visit.     Allergies:   Macrobid [nitrofurantoin macrocrystal]; Antihistamines, diphenhydramine-type; Aspirin; Ceftin [cefuroxime axetil]; Celecoxib; Cymbalta [duloxetine hcl]; Diphenhydramine; Escitalopram; Lexapro [escitalopram oxalate]; Metronidazole; Zoloft [sertraline hcl]; and Ciprofloxacin    Social History:  The patient  reports that she is a non-smoker but has been exposed to tobacco smoke. She has never used smokeless tobacco. She reports that she drinks about 0.6 oz of alcohol per week . She reports that she does not use drugs.   Family History:  The patient's family history includes Cancer in her father; Coronary artery disease in her mother; Diabetes in her father and mother; Heart attack in her brother; Heart attack (age of onset: 76) in her father; Hypertension in her father; Mental illness in her brother and mother; Sudden death in her brother.    ROS:  Please see the history of present illness.   Otherwise, review of systems are positive for none.   All other systems are reviewed and negative.    PHYSICAL EXAM: VS:  BP 118/74 (BP Location: Left Arm, Patient Position: Sitting, Cuff Size: Normal)   Pulse 72   Ht '5\' 3"'$  (1.6 m)   Wt 190 lb 4 oz (86.3 kg)   BMI 33.70 kg/m  , BMI Body mass index is 33.7 kg/m. GEN: Well nourished, well developed, in no acute distress  HEENT: normal  Neck: no JVD, carotid bruits, or masses Cardiac: RRR; no murmurs, rubs, or gallops,no edema  Respiratory:  clear to auscultation bilaterally, normal work of breathing GI: soft, nontender, nondistended, + BS MS: no deformity or atrophy  Skin: warm and dry, no rash Neuro:  Strength and sensation are  intact Psych: euthymic mood, full affect   EKG:  EKG is ordered today. The ekg ordered today demonstrates normal sinus rhythm with nonspecific T wave changes.   Recent Labs: 11/07/2015: Magnesium 2.0 06/01/2016: TSH 1.51 09/24/2016: ALT 14; B Natriuretic Peptide 135.0; BUN 16; Creatinine, Ser 1.18; Hemoglobin 12.3; Platelets 245; Potassium 4.2; Sodium 136    Lipid Panel    Component Value Date/Time   CHOL 233 (H) 06/01/2016 1136   TRIG 126.0 06/01/2016 1136   HDL 60.00 06/01/2016 1136   CHOLHDL 4 06/01/2016 1136   VLDL 25.2 06/01/2016 1136   LDLCALC 148 (H) 06/01/2016 1136   LDLDIRECT 163.4 12/31/2012 1022      Wt Readings from Last 3 Encounters:  10/04/16 190 lb 4 oz (86.3 kg)  09/24/16 194 lb (88 kg)  09/10/16 194 lb 6.4 oz (  88.2 kg)       ASSESSMENT AND PLAN:  1.  Palpitations:  Due to mild PACs and PVCs with no evidence of other arrhythmia.  She had a repeat Holter monitor which showed mild PACs and PVCs. She had a total of 178 PVCs in 48 hours representing less than 1% burden. This is overall a very low burden. She complains of palpitations even when the EKG was performed and the EKG showed no significant arrhythmia. I suspect that a lot of her symptoms are related to anxiety.  2. Essential hypertension: Blood pressure is controlled on current medications.  3. Mild leg edema: Likely due to venous insufficiency. She has no edema by exam today.  Disposition:   FU with me in 1 year.   Signed,  Victoria Sacramento, MD  10/04/2016 12:41 PM    [place]

## 2016-10-10 ENCOUNTER — Other Ambulatory Visit: Payer: Self-pay | Admitting: Internal Medicine

## 2016-10-10 NOTE — Telephone Encounter (Signed)
Refilled: 07/16/16 Last OV: 07/16/16 Last Labs: 09/24/16 Future OV: 11/13/16 Please advise?

## 2016-10-11 ENCOUNTER — Encounter: Payer: Medicare HMO | Admitting: Internal Medicine

## 2016-10-11 IMAGING — CT CT ABD-PELV W/O CM
1 of 2 series · 15 of 32 positions shown, 19 images · non-contrast
Comparison: CT scan of April 28, 2012.

CLINICAL DATA: Right lower quadrant abdominal pain for 1 month.

EXAM:
CT ABDOMEN AND PELVIS WITHOUT CONTRAST
TECHNIQUE: Multidetector CT imaging of the abdomen and pelvis was performed
following the standard protocol without IV contrast.

[Series 2: routine abd pel without · axial · non-contrast · 0.77mm/px · z∈[+314,+740]mm · 15 of 93 slices shown, 19 images]
[im 4/93  soft-tissue]
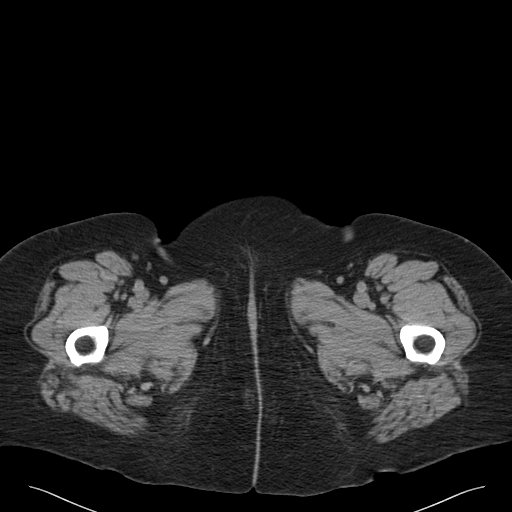
[im 4/93  bone]
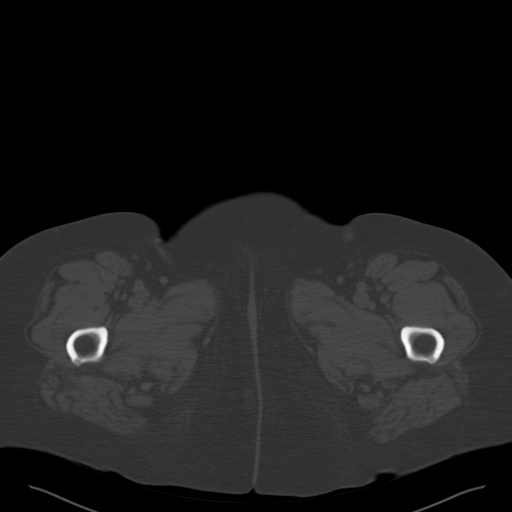
[im 12/93  soft-tissue]
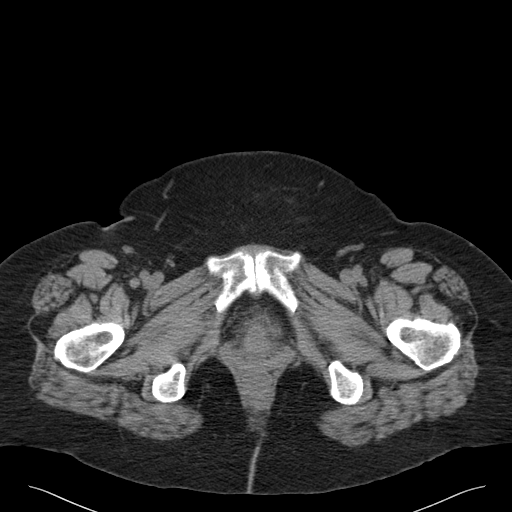
[im 20/93  soft-tissue]
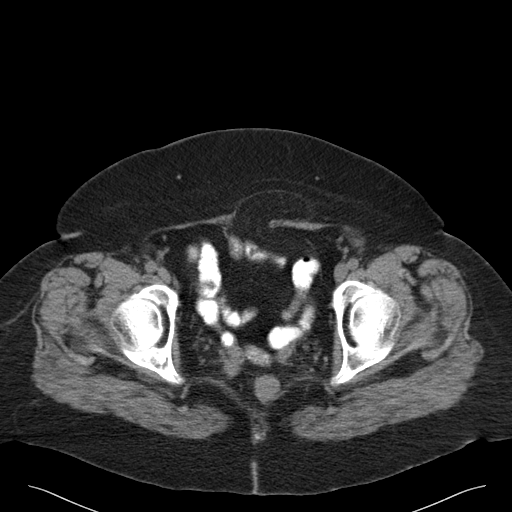
[im 27/93  soft-tissue]
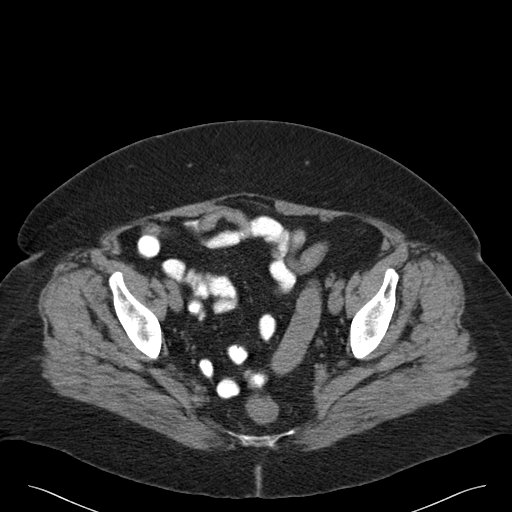
[im 31/93  soft-tissue]
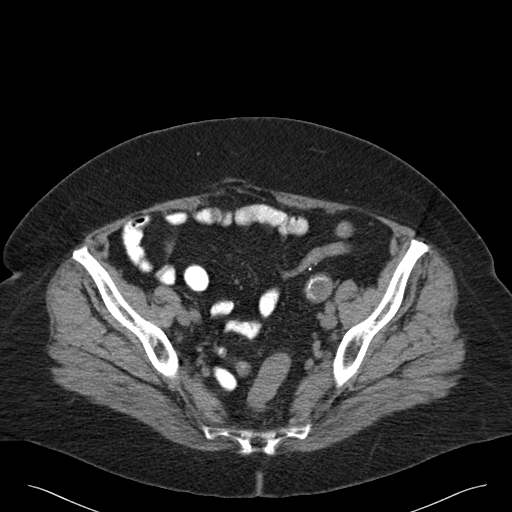
[im 39/93  soft-tissue]
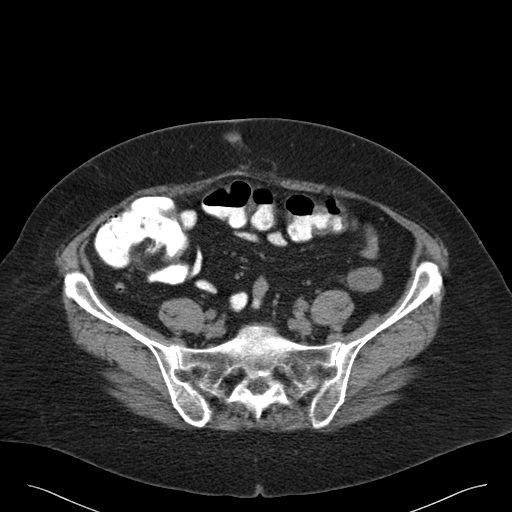
[im 47/93  soft-tissue]
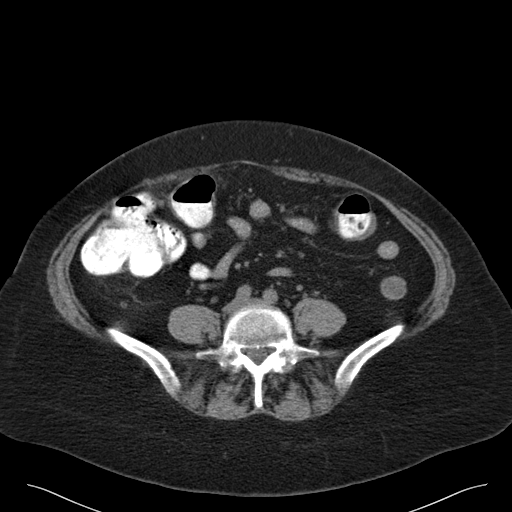
[im 54/93  soft-tissue]
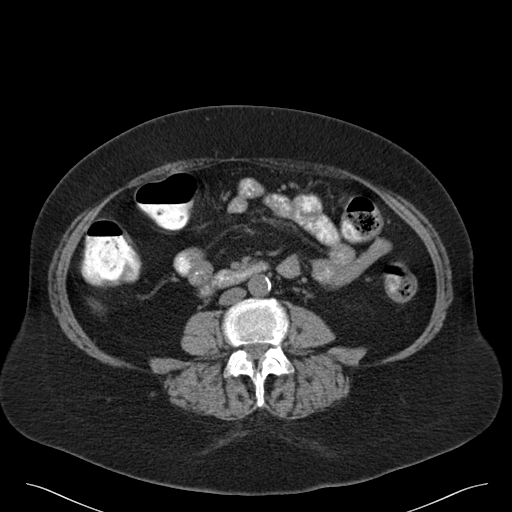
[im 62/93  soft-tissue]
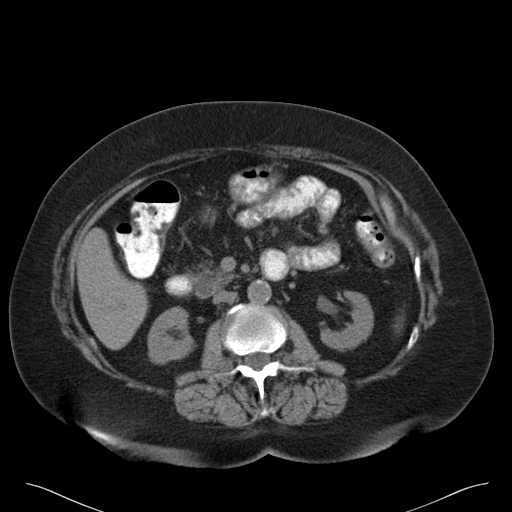
[im 62/93  bone]
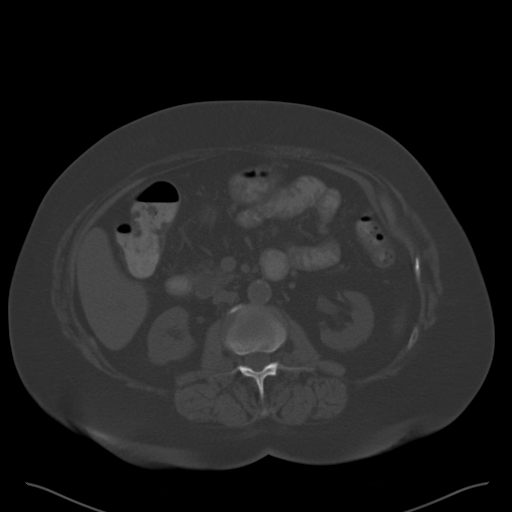
[im 66/93  soft-tissue]
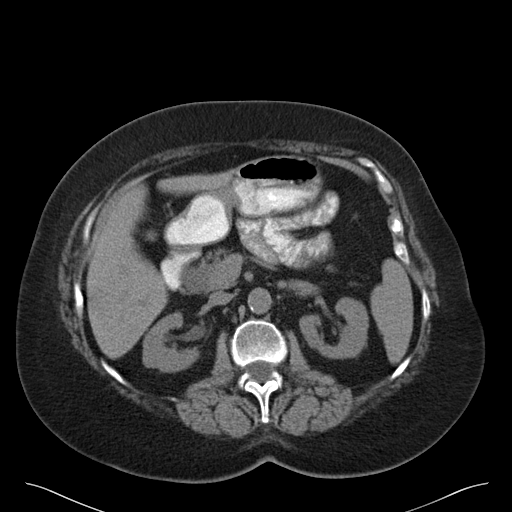
[im 73/93  soft-tissue]
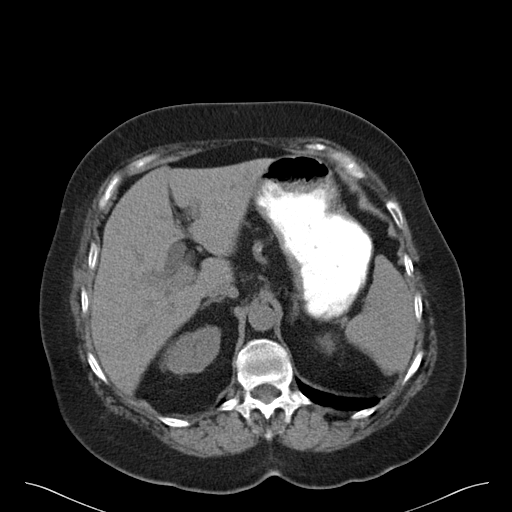
[im 77/93  lung]
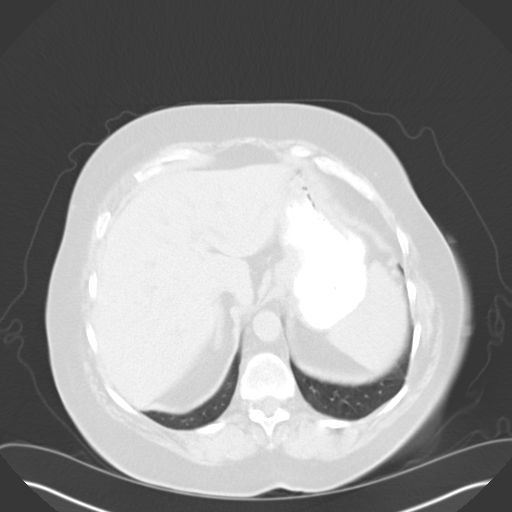
[im 81/93  soft-tissue]
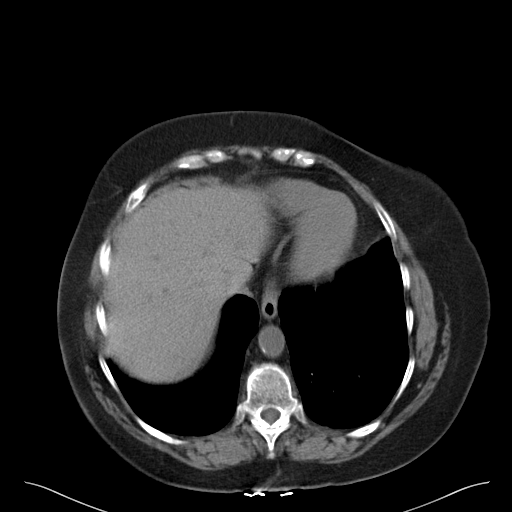
[im 81/93  lung]
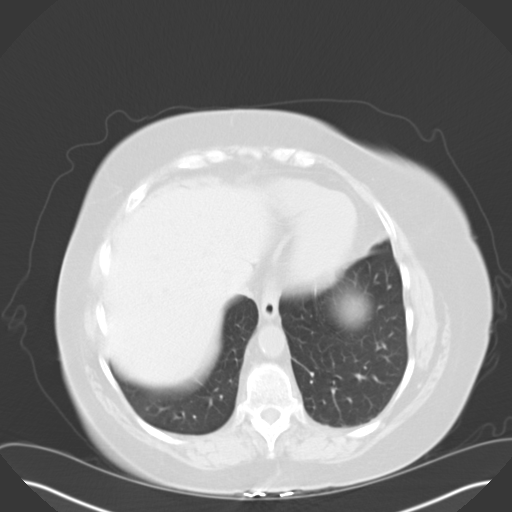
[im 85/93  lung]
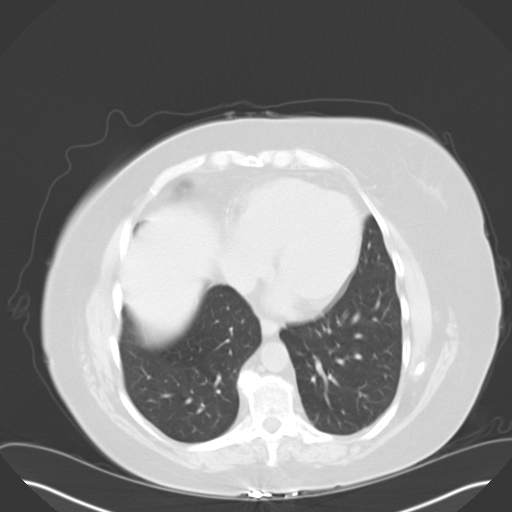
[im 89/93  soft-tissue]
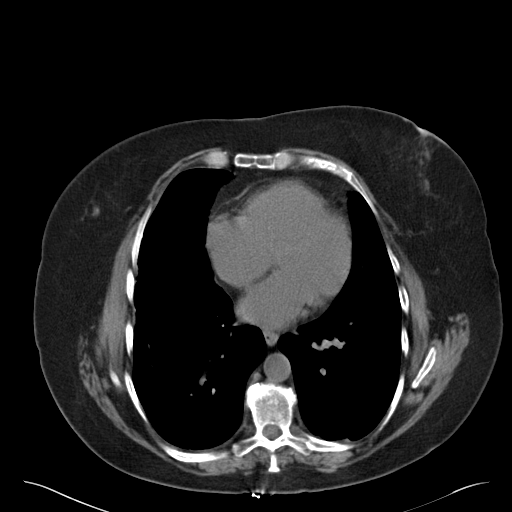
[im 89/93  lung]
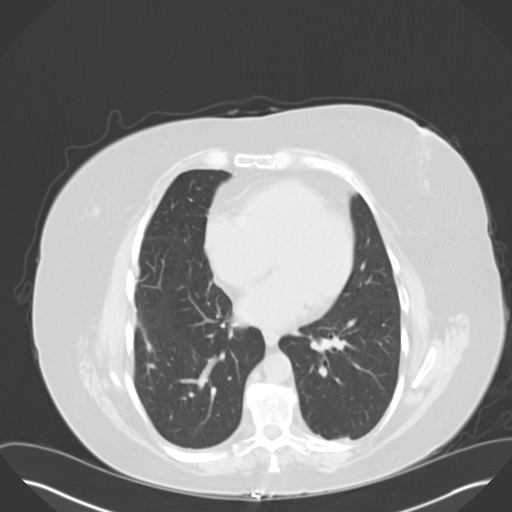

[15 of 32 positions shown; findings below may reference images not displayed]

FINDINGS: Visualized lung bases are unremarkable. No significant osseous
abnormality is noted.

Status post cholecystectomy. No focal abnormality is noted in the
liver, spleen or pancreas on these unenhanced images. Adrenal glands
and kidneys appear normal. No hydronephrosis or renal obstruction is
noted. No renal or ureteral calculi are noted. Atherosclerosis of
abdominal aorta is noted without aneurysm formation. The appendix
appears normal. There is no evidence of bowel obstruction. Urinary
bladder is decompressed. Status post hysterectomy. Small fat
containing ventral hernia is noted in the pelvis. No significant
adenopathy is noted.
IMPRESSION: Atherosclerosis of abdominal aorta without aneurysm formation.

Normal appendix.

Small fat containing ventral hernia is noted in the pelvis.

## 2016-10-11 NOTE — Telephone Encounter (Signed)
Ok's rx for trazodone #45 with one refill.

## 2016-10-15 ENCOUNTER — Other Ambulatory Visit: Payer: Self-pay | Admitting: Internal Medicine

## 2016-10-15 ENCOUNTER — Telehealth: Payer: Self-pay | Admitting: Internal Medicine

## 2016-10-15 DIAGNOSIS — I10 Essential (primary) hypertension: Secondary | ICD-10-CM

## 2016-10-15 MED ORDER — TRIAMTERENE-HCTZ 37.5-25 MG PO TABS: 1.0000 | ORAL_TABLET | Freq: Every day | ORAL | 0 refills | Status: DC

## 2016-10-15 NOTE — Telephone Encounter (Signed)
Left voice mail to call back 

## 2016-10-15 NOTE — Telephone Encounter (Signed)
Left voice mail to call back needs to keep lab appointment

## 2016-10-15 NOTE — Telephone Encounter (Signed)
Pt called about the medication of triamterene-hydrochlorothiazide (MAXZIDE-25) 37.5-25 MG tablet. Pt states she was told take 1 pill instead of half per the ER dr. Abbott Pao went to ER several weeks ago and the heart dr did not say anything different. Please advise?  Pharmacy is [facility], [place]  Call pt @ [phone]. Thank you!

## 2016-10-15 NOTE — Progress Notes (Signed)
Order placed for f/u met b.  

## 2016-10-15 NOTE — Telephone Encounter (Signed)
Patient was informed of Dr.Scott statement and has been scheduled on 05/15 for a lab, she questioned if she would still need to come in on 06/04 for her lab. Pt contact [phone]

## 2016-10-15 NOTE — Telephone Encounter (Signed)
If she has been taking triam/hctz one tablet per day - ok to refill x1.  Needs to come in for met b - to see how her kidney function is doing on the whole tablet.  Please schedule non fasting lab appt within the next week  Thanks

## 2016-10-15 NOTE — Telephone Encounter (Signed)
Nov 6//5/18 lov 07/16/16 Cardiologist stated continue current meds Patient stated continue current meds

## 2016-10-16 NOTE — Telephone Encounter (Signed)
Patient advised to keep appointment for labs in June.

## 2016-10-18 ENCOUNTER — Encounter: Payer: Self-pay | Admitting: Pulmonary Disease

## 2016-10-18 ENCOUNTER — Ambulatory Visit (INDEPENDENT_AMBULATORY_CARE_PROVIDER_SITE_OTHER): Payer: Medicare HMO | Admitting: Pulmonary Disease

## 2016-10-18 VITALS — BP 142/86 | HR 64 | Ht 63.0 in | Wt 191.0 lb

## 2016-10-18 DIAGNOSIS — R0609 Other forms of dyspnea: Secondary | ICD-10-CM | POA: Diagnosis not present

## 2016-10-18 DIAGNOSIS — Z9889 Other specified postprocedural states: Secondary | ICD-10-CM

## 2016-10-18 DIAGNOSIS — J449 Chronic obstructive pulmonary disease, unspecified: Secondary | ICD-10-CM

## 2016-10-18 MED ORDER — FLUTICASONE FUROATE-VILANTEROL 100-25 MCG/INH IN AEPB: 1.0000 | INHALATION_SPRAY | Freq: Every day | RESPIRATORY_TRACT | 5 refills | Status: DC

## 2016-10-18 NOTE — Patient Instructions (Addendum)
Trial of Breo inhaler - one inhalation daily. Rinse mouth after use. Sample provided and prescription entered Continue albuterol inhaler (pro-air or Ventolin) as needed Follow-up with me in 6-8 weeks

## 2016-10-21 NOTE — Progress Notes (Signed)
PULMONARY OFFICE FOLLOW UP NOTE  Requesting MD/Service: Eliezer Lofts, NP Date of initial consultation: 07/06/16 Reason for consultation: COPD  PT PROFILE: 66 y.o.F never smoker but with significant second hand smoke exposure and prior employment in [employer]. S/P RUL resection in 2008 for lung cancer. Carries diagnosis of COPD. Referred for eval of class II-III dyspnea.  DATA: Echocardiogram 06/21/15: normal LVEF, mildly dilated LA. RVSP est 37 mmHg CT chest 03/10/16: No evidence of pulmonary embolism. No acute findings in the thorax to account for the patient's symptoms. Status post right upper lobectomy PFT 08/07/16: FVC:  2.43L (87 %pred), FEV1:  1.61L (79 %pred), FEV1/FVC: 66% , TLC: 4.68 L (101 %pred), DLCO 61 %pred, DLCO/VA 110%  SUBJ:  Tried Bevespi once - had unspecified adverse reaction. Still with DOE exacerbated by heat. Using albuterol 2-3 times per day as needed with some benefit. Denies CP, fever, purulent sputum, hemoptysis, LE edema and calf tenderness.   Vitals:   10/18/16 1014  BP: (!) 142/86  Pulse: 64  SpO2: 98%  Weight: 191 lb (86.6 kg)  Height: '5\' 3"'$  (1.6 m)     EXAM:   Gen: WDWN in NAD HEENT: NCAT, sclerae white, oropharynx normal Neck: NO LAN, no JVD noted Lungs: moderate kyphosis, full BS, normal percussion note, no adventitious sounds Cardiovascular: Reg rate, normal rhythm, no M noted Abdomen: Soft, NT, +BS Ext: no C/C/E Neuro: PERRL, EOMI, motor/sensory grossly intact Skin: No lesions noted   DATA:   BMP Latest Ref Rng & Units 09/24/2016 09/10/2016 08/23/2016  Glucose 65 - 99 mg/dL 106(H) 103(H) 139(H)  BUN 6 - 20 mg/dL '16 16 18  '$ Creatinine 0.44 - 1.00 mg/dL 1.18(H) 1.19 1.19(H)  Sodium 135 - 145 mmol/L 136 137 135  Potassium 3.5 - 5.1 mmol/L 4.2 3.8 4.0  Chloride 101 - 111 mmol/L 102 103 103  CO2 22 - 32 mmol/L '26 27 25  '$ Calcium 8.9 - 10.3 mg/dL 9.3 9.4 9.4    CBC Latest Ref Rng & Units 09/24/2016 08/23/2016 03/10/2016  WBC 3.6 - 11.0  K/uL 8.9 8.8 9.1  Hemoglobin 12.0 - 16.0 g/dL 12.3 12.3 12.5  Hematocrit 35.0 - 47.0 % 36.6 36.1 36.6  Platelets 150 - 440 K/uL 245 259 223    CXR (09/24/16): moderate kyphosis, chronic changes, NAD   IMPRESSION:     ICD-9-CM ICD-10-CM   1. DOE (dyspnea on exertion) 786.09 R06.09   2. History of lung surgery V15.29 Z98.890   3. Stage 1 mild COPD by GOLD classification (HCC) 496 J44.9      PLAN:  Trial of Breo inhaler - one inhalation daily. Rinse mouth after use. Sample provided and prescription entered Continue albuterol inhaler (pro-air or Ventolin) as needed Follow-up with me in 6-8 weeks   Merton Border, MD PCCM service Mobile [phone] Pager [phone] 10/21/2016

## 2016-10-23 ENCOUNTER — Other Ambulatory Visit (INDEPENDENT_AMBULATORY_CARE_PROVIDER_SITE_OTHER): Payer: Medicare HMO

## 2016-10-23 DIAGNOSIS — E7800 Pure hypercholesterolemia, unspecified: Secondary | ICD-10-CM | POA: Diagnosis not present

## 2016-10-23 DIAGNOSIS — R739 Hyperglycemia, unspecified: Secondary | ICD-10-CM

## 2016-10-23 DIAGNOSIS — I10 Essential (primary) hypertension: Secondary | ICD-10-CM | POA: Diagnosis not present

## 2016-10-23 LAB — BASIC METABOLIC PANEL
BUN: 25 mg/dL — AB (ref 6–23)
CHLORIDE: 102 meq/L (ref 96–112)
CO2: 28 mEq/L (ref 19–32)
Calcium: 9.5 mg/dL (ref 8.4–10.5)
Creatinine, Ser: 1.35 mg/dL — ABNORMAL HIGH (ref 0.40–1.20)
GFR: 41.58 mL/min — AB (ref >60.00)
GLUCOSE: 103 mg/dL — AB (ref 70–99)
POTASSIUM: 4.2 meq/L (ref 3.5–5.1)
SODIUM: 136 meq/L (ref 135–145)

## 2016-10-23 LAB — HEMOGLOBIN A1C: Hgb A1c MFr Bld: 6.2 % (ref 4.6–6.5)

## 2016-10-23 LAB — HEPATIC FUNCTION PANEL
ALK PHOS: 72 U/L (ref 39–117)
ALT: 13 U/L (ref 0–35)
AST: 17 U/L (ref 0–37)
Albumin: 3.9 g/dL (ref 3.5–5.2)
BILIRUBIN DIRECT: 0.1 mg/dL (ref 0.0–0.3)
TOTAL PROTEIN: 7.1 g/dL (ref 6.0–8.3)
Total Bilirubin: 0.4 mg/dL (ref 0.2–1.2)

## 2016-10-23 LAB — LIPID PANEL
CHOLESTEROL: 221 mg/dL — AB (ref 0–200)
HDL: 56 mg/dL (ref >39.00)
LDL Cholesterol: 138 mg/dL — ABNORMAL HIGH (ref 0–99)
NonHDL: 165.02
TRIGLYCERIDES: 135 mg/dL (ref 0.0–149.0)
Total CHOL/HDL Ratio: 4
VLDL: 27 mg/dL (ref 0.0–40.0)

## 2016-10-25 ENCOUNTER — Other Ambulatory Visit: Payer: Self-pay | Admitting: Internal Medicine

## 2016-10-25 DIAGNOSIS — Z1231 Encounter for screening mammogram for malignant neoplasm of breast: Secondary | ICD-10-CM

## 2016-10-29 ENCOUNTER — Other Ambulatory Visit: Payer: Medicare HMO

## 2016-10-31 ENCOUNTER — Encounter: Payer: Medicare HMO | Admitting: Internal Medicine

## 2016-11-02 ENCOUNTER — Other Ambulatory Visit (INDEPENDENT_AMBULATORY_CARE_PROVIDER_SITE_OTHER): Payer: Medicare HMO

## 2016-11-02 DIAGNOSIS — I10 Essential (primary) hypertension: Secondary | ICD-10-CM

## 2016-11-02 LAB — BASIC METABOLIC PANEL
BUN: 18 mg/dL (ref 6–23)
CALCIUM: 9.3 mg/dL (ref 8.4–10.5)
CHLORIDE: 104 meq/L (ref 96–112)
CO2: 25 meq/L (ref 19–32)
Creatinine, Ser: 1.26 mg/dL — ABNORMAL HIGH (ref 0.40–1.20)
GFR: 45.02 mL/min — ABNORMAL LOW (ref >60.00)
Glucose, Bld: 106 mg/dL — ABNORMAL HIGH (ref 70–99)
Potassium: 4.3 mEq/L (ref 3.5–5.1)
SODIUM: 137 meq/L (ref 135–145)

## 2016-11-06 ENCOUNTER — Telehealth: Payer: Self-pay

## 2016-11-06 MED ORDER — PERMETHRIN 5 % EX CREA: 1.0000 "application " | TOPICAL_CREAM | Freq: Once | CUTANEOUS | 0 refills | Status: DC

## 2016-11-06 NOTE — Telephone Encounter (Signed)
Sent in rx for elimite cream.  Directions - massage cream from head to soles of feet.  Leave on for 8-14 hours.  May use on scalp.

## 2016-11-06 NOTE — Telephone Encounter (Signed)
Called patient states that she was exposed to scabies would like cream called in. States that her daughter and granddaughter were both seen in urgent care over the weekend and were diagnosed and they live in the home. Also wanted to know if their was a shampoo that they needed. The cream states to put on for neck down. She has started treatment with family medication already.

## 2016-11-06 NOTE — Telephone Encounter (Signed)
Left message to return call to our office.  

## 2016-11-07 NOTE — Telephone Encounter (Signed)
Patient informed all information will call if any questions.

## 2016-11-09 ENCOUNTER — Other Ambulatory Visit: Payer: Self-pay | Admitting: Internal Medicine

## 2016-11-12 ENCOUNTER — Other Ambulatory Visit: Payer: Medicare HMO

## 2016-11-12 ENCOUNTER — Other Ambulatory Visit: Payer: Self-pay

## 2016-11-12 MED ORDER — PERMETHRIN 5 % EX CREA: 1.0000 "application " | TOPICAL_CREAM | Freq: Once | CUTANEOUS | 0 refills | Status: AC

## 2016-11-12 NOTE — Telephone Encounter (Signed)
Called patient she is ok would like to move out. I have called in refill as well.

## 2016-11-12 NOTE — Telephone Encounter (Signed)
Called patient she states that she is not having any symptoms. She has app with you tomorrow for follow up. She is not sure if she needs keep it or if she should stay out of office. She also wanted to know if she needs refill on the cream. Everybody else was told to retreat in seven days.

## 2016-11-12 NOTE — Telephone Encounter (Signed)
Pt called and stated that she needs a refill on permethrin cream. She also has an appt tomorrow with Dr. Nicki Reaper should she still come in or reschedule. Please advise, thank you!  Call pt @ [place], [place]

## 2016-11-12 NOTE — Telephone Encounter (Signed)
Ok to refill x 1.  If she is doing well, then can postpone appt.  Please place hold spot on the appt, because I have a pt I need to put in that spot.

## 2016-11-13 ENCOUNTER — Encounter: Payer: Medicare HMO | Admitting: Internal Medicine

## 2016-12-06 ENCOUNTER — Ambulatory Visit: Payer: Medicare HMO | Admitting: Pulmonary Disease

## 2016-12-07 ENCOUNTER — Ambulatory Visit: Payer: Medicare HMO

## 2016-12-17 ENCOUNTER — Telehealth: Payer: Self-pay | Admitting: *Deleted

## 2016-12-17 MED ORDER — OMEPRAZOLE 20 MG PO CPDR: 20.0000 mg | DELAYED_RELEASE_CAPSULE | Freq: Every day | ORAL | 2 refills | Status: DC

## 2016-12-17 MED ORDER — ALPRAZOLAM 0.5 MG PO TABS: 0.5000 mg | ORAL_TABLET | Freq: Every day | ORAL | 0 refills | Status: DC | PRN

## 2016-12-17 NOTE — Telephone Encounter (Signed)
ok'd rx for alprazolam #30 with no refills and prilosec #30 with 2 refills.

## 2016-12-17 NOTE — Telephone Encounter (Signed)
Medication Refill requested for : alprazolam and prilosec Pharmacy: [facility]  Return Contact :

## 2016-12-17 NOTE — Telephone Encounter (Signed)
Last o/v 07/16/16 F/u 01/14/17 Xanax 0.5 last rf 05/12/17 # 30 no r/f  Prilosec never given by you

## 2016-12-18 NOTE — Telephone Encounter (Signed)
Faxed

## 2016-12-27 DIAGNOSIS — G4752 REM sleep behavior disorder: Secondary | ICD-10-CM | POA: Diagnosis not present

## 2016-12-27 DIAGNOSIS — F339 Major depressive disorder, recurrent, unspecified: Secondary | ICD-10-CM | POA: Diagnosis not present

## 2016-12-27 DIAGNOSIS — M25561 Pain in right knee: Secondary | ICD-10-CM | POA: Diagnosis not present

## 2016-12-27 DIAGNOSIS — G20 Parkinson's disease: Secondary | ICD-10-CM | POA: Diagnosis not present

## 2016-12-27 DIAGNOSIS — R200 Anesthesia of skin: Secondary | ICD-10-CM | POA: Diagnosis not present

## 2016-12-27 DIAGNOSIS — R0602 Shortness of breath: Secondary | ICD-10-CM | POA: Diagnosis not present

## 2016-12-27 DIAGNOSIS — R002 Palpitations: Secondary | ICD-10-CM | POA: Diagnosis not present

## 2016-12-31 ENCOUNTER — Encounter: Payer: Self-pay | Admitting: Pulmonary Disease

## 2016-12-31 ENCOUNTER — Ambulatory Visit (INDEPENDENT_AMBULATORY_CARE_PROVIDER_SITE_OTHER): Payer: Medicare HMO | Admitting: Pulmonary Disease

## 2016-12-31 VITALS — BP 152/90 | HR 76 | Ht 63.0 in | Wt 194.0 lb

## 2016-12-31 DIAGNOSIS — J449 Chronic obstructive pulmonary disease, unspecified: Secondary | ICD-10-CM | POA: Diagnosis not present

## 2016-12-31 MED ORDER — FLUTICASONE-SALMETEROL 115-21 MCG/ACT IN AERO: 2.0000 | INHALATION_SPRAY | Freq: Two times a day (BID) | RESPIRATORY_TRACT | 4 refills | Status: DC

## 2016-12-31 NOTE — Patient Instructions (Signed)
Per your request, I have discontinued Breo inhaler and ordered Advair HFA inhaler with a 3 month supply  Continue albuterol inhaler as needed  Follow-up in 3-4 months

## 2016-12-31 NOTE — Progress Notes (Addendum)
PULMONARY OFFICE FOLLOW UP NOTE  Requesting MD/Service: Eliezer Lofts, NP Date of initial consultation: 07/06/16 Reason for consultation: COPD  PT PROFILE: 66 y.o.F never smoker but with significant second hand smoke exposure and prior employment in [employer]. S/P RUL resection in 2008 for lung cancer. Carries diagnosis of COPD. Referred for eval of class II-III dyspnea.  DATA: Echocardiogram 06/21/15: normal LVEF, mildly dilated LA. RVSP est 37 mmHg CT chest 03/10/16: No evidence of pulmonary embolism. No acute findings in the thorax to account for the patient's symptoms. Status post right upper lobectomy PFT 08/07/16: FVC:  2.43L (87 %pred), FEV1:  1.61L (79 %pred), FEV1/FVC: 66% , TLC: 4.68 L (101 %pred), DLCO 61 %pred, DLCO/VA 110%  SUBJ:  This is a routine reevaluation. She has remained on Breo but is unable to tell if it is of any benefit. She wishes to try changing from [facility] to [place].  She continues to use her albuterol rescue inhaler 2-3 times per day. He struggles in particular with changes in temperature and humidity. Denies CP, fever, purulent sputum, hemoptysis, LE edema and calf tenderness.   Vitals:   12/31/16 0938 12/31/16 0939  BP:  (!) 152/90  Pulse:  76  SpO2:  100%  Weight: 194 lb (88 kg)   Height: 5\' 3"  (1.6 m)      EXAM:   Gen: WDWN in NAD HEENT: WNL Lungs: Full BS, no adventitious sounds Cardiovascular: Reg, no M noted Abdomen: Soft, NT, +BS Ext: no C/C/E Neuro: grossly intact   DATA:   BMP Latest Ref Rng & Units 11/02/2016 10/23/2016 09/24/2016  Glucose 70 - 99 mg/dL 106(H) 103(H) 106(H)  BUN 6 - 23 mg/dL 18 25(H) 16  Creatinine 0.40 - 1.20 mg/dL 1.26(H) 1.35(H) 1.18(H)  Sodium 135 - 145 mEq/L 137 136 136  Potassium 3.5 - 5.1 mEq/L 4.3 4.2 4.2  Chloride 96 - 112 mEq/L 104 102 102  CO2 19 - 32 mEq/L 25 28 26   Calcium 8.4 - 10.5 mg/dL 9.3 9.5 9.3    CBC Latest Ref Rng & Units 09/24/2016 08/23/2016 03/10/2016  WBC 3.6 - 11.0 K/uL 8.9 8.8 9.1   Hemoglobin 12.0 - 16.0 g/dL 12.3 12.3 12.5  Hematocrit 35.0 - 47.0 % 36.6 36.1 36.6  Platelets 150 - 440 K/uL 245 259 223    CXR: No new film  IMPRESSION:     ICD-10-CM   1. COPD, mild ([place]) J44.9     PLAN:  Per her request, I have discontinued Breo inhaler and ordered Advair HFA inhaler with a 3 month supply  Continue albuterol inhaler as needed  Follow-up in 3-4 months   Merton Border, MD PCCM service Mobile [phone] Pager [phone] 12/31/2016

## 2017-01-14 ENCOUNTER — Ambulatory Visit (INDEPENDENT_AMBULATORY_CARE_PROVIDER_SITE_OTHER): Payer: Medicare HMO | Admitting: Internal Medicine

## 2017-01-14 ENCOUNTER — Encounter: Payer: Self-pay | Admitting: Internal Medicine

## 2017-01-14 VITALS — BP 140/88 | HR 72 | Temp 98.6°F | Resp 14 | Ht 63.0 in | Wt 192.8 lb

## 2017-01-14 DIAGNOSIS — C189 Malignant neoplasm of colon, unspecified: Secondary | ICD-10-CM

## 2017-01-14 DIAGNOSIS — Z23 Encounter for immunization: Secondary | ICD-10-CM | POA: Diagnosis not present

## 2017-01-14 DIAGNOSIS — E7800 Pure hypercholesterolemia, unspecified: Secondary | ICD-10-CM | POA: Diagnosis not present

## 2017-01-14 DIAGNOSIS — Z6834 Body mass index (BMI) 34.0-34.9, adult: Secondary | ICD-10-CM | POA: Diagnosis not present

## 2017-01-14 DIAGNOSIS — I6523 Occlusion and stenosis of bilateral carotid arteries: Secondary | ICD-10-CM

## 2017-01-14 DIAGNOSIS — N183 Chronic kidney disease, stage 3 unspecified: Secondary | ICD-10-CM

## 2017-01-14 DIAGNOSIS — Z0000 Encounter for general adult medical examination without abnormal findings: Secondary | ICD-10-CM | POA: Diagnosis not present

## 2017-01-14 DIAGNOSIS — R739 Hyperglycemia, unspecified: Secondary | ICD-10-CM | POA: Diagnosis not present

## 2017-01-14 DIAGNOSIS — C7801 Secondary malignant neoplasm of right lung: Secondary | ICD-10-CM | POA: Diagnosis not present

## 2017-01-14 DIAGNOSIS — K219 Gastro-esophageal reflux disease without esophagitis: Secondary | ICD-10-CM

## 2017-01-14 DIAGNOSIS — I10 Essential (primary) hypertension: Secondary | ICD-10-CM

## 2017-01-14 NOTE — Progress Notes (Signed)
Pre-visit discussion using our clinic review tool. No additional management support is needed unless otherwise documented below in the visit note.  

## 2017-01-14 NOTE — Progress Notes (Signed)
Patient ID: Victoria Hanson, female   DOB: Oct 15, 1950, 66 y.o.   MRN: [id number]   Subjective:    Patient ID: Victoria Hanson, female    DOB: [birth year], 66 y.o.   MRN: [id number]  HPI  Patient here for her physical exam.  She has recently been diagnosed with parkinson's.  Seeing neurology.  On sinemet now.  States she is having dreams when sleeping.  Plans to discuss with neurology.  Does feel the sinemet is helping.  No chest pain.  Breathing stable.  Doing well with advair.  Saw Dr Alva Garnet 12/31/16.  Started advair then.  No increased cough or congestion.  Previous saw Dr Fletcher Anon 06/2016.  Had holter.  Rare PACs and PVCs.  No acid reflux.  No abdominal pain.  Bowels moving.  No urine change.  Saw Dr Mike Gip 08/2016.  Felt stable.  Recommended f/u in 6 months.     Past Medical History:  Diagnosis Date  . Anxiety   . Atypical chest pain    a. 2003 Cath: reportedly nl per pt [name]);  b. 04/2015 [place] Admission, neg troponin.  . Colon cancer ([place])    a. 1999: Pt had partial colectomy. Did develop lung metastasis. Had right upper lobectomy in 01/2007 then had associated chemotherapy.  Marland Kitchen COPD (chronic obstructive pulmonary disease) ([place])   . DDD (degenerative disc disease), lumbosacral   . Depression   . Echocardiogram abnormal    a. 02/2010 Echo: EF 55-60%, mild LAE, no regional wall motion abnormalities  . Elevated transaminase level    a. ? NASH;  b. 06/2015 - nl LFTs.  . GERD (gastroesophageal reflux disease)   . Hematuria   . Holter monitor, abnormal    a. 12/2009 Holter monitoring: NSR, rare PACs and PVCs.  Marland Kitchen Hypercholesterolemia   . Hyperlipidemia   . Hypertension   . Leaky heart valve    a. Per pt report - no evidence of valvular abnormalities on prior echoes.  . Lung cancer ([place])    a. S/P resection (lobectomy - right)  . Morbid obesity ([place])   . Nephrolithiasis   . Parkinson disease ([place]) 08/2016   [name]   Past Surgical History:  Procedure Laterality Date  . BREAST BIOPSY   10/07/2009   Right breast  . CARDIAC CATHETERIZATION  2003   negative as per pt report  . CHOLECYSTECTOMY  2000  . COLON SURGERY     Colon cancer, removed part of large colon  . COLONOSCOPY N/A 04/25/2015   Procedure: COLONOSCOPY;  Surgeon: Manya Silvas, MD;  Location: [facility]COPY;  Service: Endoscopy;  Laterality: N/A;  . LOBECTOMY     Right lung, upper lobe right side removed  . TOTAL ABDOMINAL HYSTERECTOMY     abnormal bleeding  . TUBAL LIGATION  1980   Family History  Problem Relation Age of Onset  . Coronary artery disease Mother        died @ 59 of MI.  . Mental illness Mother   . Diabetes Mother   . Heart attack Father 71       MI  . Cancer Father        lung - died in his mid-70's  . Hypertension Father   . Diabetes Father   . Sudden death Brother   . Mental illness Brother   . Heart attack Brother        MI @ 46, died @ age 50.  . Breast cancer Neg Hx    Social History  Social History  . Marital status: Widowed    Spouse name: N/A  . Number of children: N/A  . Years of education: N/A   Social History Main Topics  . Smoking status: Passive Smoke Exposure - Never Smoker  . Smokeless tobacco: Never Used     Comment: husband and sons smoked in her home.  . Alcohol use 0.6 oz/week    1 Glasses of wine per week     Comment: rare wine cooler.  . Drug use: No  . Sexual activity: Not Asked   Other Topics Concern  . None   Social History Narrative   Widowed.   Disabled (colon/lung CA), retired.   Lives in [place] with her dtr, grand-dtr, and son.   Active.   Gets regular exercise, walks.    Outpatient Encounter Prescriptions as of 01/14/2017  Medication Sig  . albuterol (PROVENTIL HFA;VENTOLIN HFA) 108 (90 Base) MCG/ACT inhaler Inhale 2 puffs into the lungs every 6 (six) hours as needed for wheezing.  Marland Kitchen ALPRAZolam (XANAX) 0.5 MG tablet Take 1 tablet (0.5 mg total) by mouth daily as needed for anxiety.  Marland Kitchen aspirin EC 81 MG tablet Take 81 mg  by mouth daily.  . carbidopa-levodopa (SINEMET IR) 25-250 MG tablet Take 1 tablet by mouth 3 (three) times daily.  . fluticasone-salmeterol (ADVAIR HFA) 115-21 MCG/ACT inhaler Inhale 2 puffs into the lungs 2 (two) times daily.  . Multiple Vitamin (MULTIVITAMIN) tablet Take 1 tablet by mouth daily.  Marland Kitchen omeprazole (PRILOSEC) 20 MG capsule Take 1 capsule (20 mg total) by mouth daily.  . potassium chloride SA (K-DUR,KLOR-CON) 20 MEQ tablet TAKE ONE TABLET BY MOUTH TWICE A DAY.  . traZODone (DESYREL) 50 MG tablet TAKE ONE AND ONE-HALF TABLET BY MOUTH ATBEDTIME  . triamterene-hydrochlorothiazide (MAXZIDE-25) 37.5-25 MG tablet Take 1 tablet by mouth daily.  . carbidopa-levodopa (SINEMET IR) 25-100 MG tablet Take by mouth.   No facility-administered encounter medications on file as of 01/14/2017.     Review of Systems  Constitutional: Negative for appetite change and unexpected weight change.  HENT: Negative for congestion and sinus pressure.   Eyes: Negative for pain and visual disturbance.  Respiratory: Negative for cough, chest tightness and shortness of breath.   Cardiovascular: Negative for chest pain, palpitations and leg swelling.  Gastrointestinal: Negative for abdominal pain, diarrhea, nausea and vomiting.  Genitourinary: Negative for difficulty urinating and dysuria.  Musculoskeletal: Negative for back pain and joint swelling.  Skin: Negative for color change and rash.  Neurological: Negative for dizziness, light-headedness and headaches.  Hematological: Negative for adenopathy. Does not bruise/bleed easily.  Psychiatric/Behavioral: Negative for agitation and dysphoric mood.       Objective:    Physical Exam  Constitutional: She is oriented to person, place, and time. She appears well-developed and well-nourished. No distress.  HENT:  Nose: Nose normal.  Mouth/Throat: Oropharynx is clear and moist.  Eyes: Right eye exhibits no discharge. Left eye exhibits no discharge. No scleral  icterus.  Neck: Neck supple. No thyromegaly present.  Cardiovascular: Normal rate and regular rhythm.   Pulmonary/Chest: Breath sounds normal. No accessory muscle usage. No tachypnea. No respiratory distress. She has no decreased breath sounds. She has no wheezes. She has no rhonchi. Right breast exhibits no inverted nipple, no mass, no nipple discharge and no tenderness (no axillary adenopathy). Left breast exhibits no inverted nipple, no mass, no nipple discharge and no tenderness (no axilarry adenopathy).  Abdominal: Soft. Bowel sounds are normal. There is no tenderness.  Musculoskeletal: She exhibits no edema or tenderness.  Lymphadenopathy:    She has no cervical adenopathy.  Neurological: She is alert and oriented to person, place, and time.  Skin: Skin is warm. No rash noted. No erythema.  Psychiatric: She has a normal mood and affect. Her behavior is normal.    BP 140/88 (BP Location: Left Arm, Patient Position: Sitting, Cuff Size: Normal)   Pulse 72   Temp 98.6 F (37 C) (Oral)   Resp 14   Ht '5\' 3"'$  (1.6 m)   Wt 192 lb 12.8 oz (87.5 kg)   SpO2 99%   BMI 34.15 kg/m  Wt Readings from Last 3 Encounters:  01/14/17 192 lb 12.8 oz (87.5 kg)  12/31/16 194 lb (88 kg)  10/18/16 191 lb (86.6 kg)     Lab Results  Component Value Date   WBC 8.9 09/24/2016   HGB 12.3 09/24/2016   HCT 36.6 09/24/2016   PLT 245 09/24/2016   GLUCOSE 106 (H) 11/02/2016   CHOL 221 (H) 10/23/2016   TRIG 135.0 10/23/2016   HDL 56.00 10/23/2016   LDLDIRECT 163.4 12/31/2012   LDLCALC 138 (H) 10/23/2016   ALT 13 10/23/2016   AST 17 10/23/2016   NA 137 11/02/2016   K 4.3 11/02/2016   CL 104 11/02/2016   CREATININE 1.26 (H) 11/02/2016   BUN 18 11/02/2016   CO2 25 11/02/2016   TSH 1.51 06/01/2016   INR 1.0 12/12/2012   HGBA1C 6.2 10/23/2016       Assessment & Plan:   Problem List Items Addressed This Visit    BMI 34.0-34.9,adult    Diet and exercise.  Follow.       Carotid stenosis     Saw [facility] 07/19/16 - carotid ultrasound - <30%.  Recommended f/u prn.       CKD (chronic kidney disease), stage III    Stay hydrated.  Previous renal ultrasound revealed no significant abnormalities.  Follow renal function.       Essential (primary) hypertension   Relevant Orders   TSH   Basic metabolic panel   GERD (gastroesophageal reflux disease)    Controlled on omeprazole.        Hyperglycemia    Low carb diet and exercise.  Follow met b and a1c.       Hypertension    Blood pressure under good control.  Continue same medication regimen.  Follow pressures.  Follow metabolic panel.        Lung metastasis ([place])    Followed by Dr Mike Gip.       Malignant neoplasm of colon ([place])    Colonoscopy 04/25/15 - as outlined in overview.  Followed by GI and oncology.  Evaluated by Dr Mike Gip 08/2016.  Recommended f/u in 6 months.        Pure hypercholesterolemia    Low cholesterol diet and exercise.  Follow lipid panel.         Other Visit Diagnoses    Routine general medical examination at a health care facility    -  Primary   Need for pneumococcal vaccination           Einar Pheasant, MD

## 2017-01-15 ENCOUNTER — Telehealth: Payer: Self-pay | Admitting: Internal Medicine

## 2017-01-15 ENCOUNTER — Encounter: Payer: Self-pay | Admitting: Internal Medicine

## 2017-01-15 LAB — BASIC METABOLIC PANEL
BUN: 17 mg/dL (ref 7–25)
CHLORIDE: 105 mmol/L (ref 98–110)
CO2: 22 mmol/L (ref 20–32)
Calcium: 9.6 mg/dL (ref 8.6–10.4)
Creat: 1.16 mg/dL — ABNORMAL HIGH (ref 0.50–0.99)
GLUCOSE: 93 mg/dL (ref 65–99)
POTASSIUM: 4.4 mmol/L (ref 3.5–5.3)
Sodium: 139 mmol/L (ref 135–146)

## 2017-01-15 LAB — TSH: TSH: 1.43 mIU/L

## 2017-01-15 NOTE — Assessment & Plan Note (Signed)
Diet and exercise.  Follow.  

## 2017-01-15 NOTE — Assessment & Plan Note (Signed)
Low cholesterol diet and exercise.  Follow lipid panel.   

## 2017-01-15 NOTE — Assessment & Plan Note (Signed)
Blood pressure under good control.  Continue same medication regimen.  Follow pressures.  Follow metabolic panel.   

## 2017-01-15 NOTE — Telephone Encounter (Signed)
Pt caled back returning your call. Please advise, thank you!  Pt will be at [phone] for a little bit longer if not please call [phone].

## 2017-01-15 NOTE — Assessment & Plan Note (Signed)
Followed by Dr Mike Gip.

## 2017-01-15 NOTE — Assessment & Plan Note (Signed)
Stay hydrated.  Previous renal ultrasound revealed no significant abnormalities.  Follow renal function.

## 2017-01-15 NOTE — Assessment & Plan Note (Signed)
Controlled on omeprazole.   

## 2017-01-15 NOTE — Assessment & Plan Note (Signed)
Colonoscopy 04/25/15 - as outlined in overview.  Followed by GI and oncology.  Evaluated by Dr Mike Gip 08/2016.  Recommended f/u in 6 months.

## 2017-01-15 NOTE — Telephone Encounter (Signed)
See lab results.  

## 2017-01-15 NOTE — Assessment & Plan Note (Signed)
Saw [facility] 07/19/16 - carotid ultrasound - <30%.  Recommended f/u prn.

## 2017-01-15 NOTE — Assessment & Plan Note (Signed)
Low carb diet and exercise.  Follow met b and a1c.  

## 2017-01-25 ENCOUNTER — Telehealth: Payer: Self-pay | Admitting: Internal Medicine

## 2017-01-25 NOTE — Telephone Encounter (Signed)
Left pt message asking to call Ebony Hail back directly at [phone] to schedule AWV. Thanks!  *NOTE* Last AWV 10/11/15

## 2017-01-28 ENCOUNTER — Ambulatory Visit
Admission: RE | Admit: 2017-01-28 | Discharge: 2017-01-28 | Disposition: A | Discharge status: Home / Self Care | Payer: Medicare HMO | Source: Ambulatory Visit | Attending: Internal Medicine | Admitting: Internal Medicine

## 2017-01-28 DIAGNOSIS — Z1231 Encounter for screening mammogram for malignant neoplasm of breast: Secondary | ICD-10-CM | POA: Insufficient documentation

## 2017-01-31 ENCOUNTER — Telehealth: Payer: Self-pay | Admitting: *Deleted

## 2017-01-31 NOTE — Telephone Encounter (Signed)
PA started on Advair via covermymeds. QBH:[id number] Pending decision.

## 2017-02-04 ENCOUNTER — Telehealth: Payer: Self-pay | Admitting: *Deleted

## 2017-02-04 NOTE — Telephone Encounter (Signed)
Spoke with Victoria Hanson. With [employer] in regards to denial of Advair 115/21 Pharmacy was running claim incorrectly which was causing denial. Called pharmacy and informed and they corrected. Nothing further needed.

## 2017-02-19 ENCOUNTER — Other Ambulatory Visit: Payer: Self-pay | Admitting: Internal Medicine

## 2017-02-28 ENCOUNTER — Inpatient Hospital Stay (HOSPITAL_BASED_OUTPATIENT_CLINIC_OR_DEPARTMENT_OTHER): Payer: Medicare HMO | Admitting: Hematology and Oncology

## 2017-02-28 ENCOUNTER — Inpatient Hospital Stay: Payer: Medicare HMO | Attending: Hematology and Oncology

## 2017-02-28 ENCOUNTER — Other Ambulatory Visit: Payer: Self-pay | Admitting: *Deleted

## 2017-02-28 ENCOUNTER — Encounter: Payer: Self-pay | Admitting: Hematology and Oncology

## 2017-02-28 VITALS — BP 127/82 | HR 67 | Temp 97.4°F | Resp 16 | Wt 190.3 lb

## 2017-02-28 DIAGNOSIS — I10 Essential (primary) hypertension: Secondary | ICD-10-CM | POA: Diagnosis not present

## 2017-02-28 DIAGNOSIS — M5137 Other intervertebral disc degeneration, lumbosacral region: Secondary | ICD-10-CM | POA: Insufficient documentation

## 2017-02-28 DIAGNOSIS — N200 Calculus of kidney: Secondary | ICD-10-CM | POA: Insufficient documentation

## 2017-02-28 DIAGNOSIS — Z79899 Other long term (current) drug therapy: Secondary | ICD-10-CM | POA: Diagnosis not present

## 2017-02-28 DIAGNOSIS — J449 Chronic obstructive pulmonary disease, unspecified: Secondary | ICD-10-CM

## 2017-02-28 DIAGNOSIS — F419 Anxiety disorder, unspecified: Secondary | ICD-10-CM | POA: Insufficient documentation

## 2017-02-28 DIAGNOSIS — K648 Other hemorrhoids: Secondary | ICD-10-CM | POA: Diagnosis not present

## 2017-02-28 DIAGNOSIS — E7800 Pure hypercholesterolemia, unspecified: Secondary | ICD-10-CM | POA: Insufficient documentation

## 2017-02-28 DIAGNOSIS — C189 Malignant neoplasm of colon, unspecified: Secondary | ICD-10-CM

## 2017-02-28 DIAGNOSIS — Z9221 Personal history of antineoplastic chemotherapy: Secondary | ICD-10-CM | POA: Diagnosis not present

## 2017-02-28 DIAGNOSIS — F329 Major depressive disorder, single episode, unspecified: Secondary | ICD-10-CM | POA: Diagnosis not present

## 2017-02-28 DIAGNOSIS — G20 Parkinson's disease: Secondary | ICD-10-CM | POA: Insufficient documentation

## 2017-02-28 DIAGNOSIS — Z7982 Long term (current) use of aspirin: Secondary | ICD-10-CM | POA: Insufficient documentation

## 2017-02-28 DIAGNOSIS — K625 Hemorrhage of anus and rectum: Secondary | ICD-10-CM | POA: Insufficient documentation

## 2017-02-28 DIAGNOSIS — Z85038 Personal history of other malignant neoplasm of large intestine: Secondary | ICD-10-CM | POA: Diagnosis not present

## 2017-02-28 DIAGNOSIS — C7801 Secondary malignant neoplasm of right lung: Secondary | ICD-10-CM

## 2017-02-28 DIAGNOSIS — K219 Gastro-esophageal reflux disease without esophagitis: Secondary | ICD-10-CM | POA: Insufficient documentation

## 2017-02-28 DIAGNOSIS — Z85118 Personal history of other malignant neoplasm of bronchus and lung: Secondary | ICD-10-CM

## 2017-02-28 DIAGNOSIS — K921 Melena: Secondary | ICD-10-CM

## 2017-02-28 DIAGNOSIS — D509 Iron deficiency anemia, unspecified: Secondary | ICD-10-CM

## 2017-02-28 LAB — CBC WITH DIFFERENTIAL/PLATELET
Basophils Absolute: 0.1 10*3/uL (ref 0–0.1)
Basophils Relative: 1 %
Eosinophils Absolute: 0.1 10*3/uL (ref 0–0.7)
Eosinophils Relative: 1 %
HCT: 34.3 % — ABNORMAL LOW (ref 35.0–47.0)
Hemoglobin: 12 g/dL (ref 12.0–16.0)
Lymphocytes Relative: 18 %
Lymphs Abs: 1.6 10*3/uL (ref 1.0–3.6)
MCH: 29.5 pg (ref 26.0–34.0)
MCHC: 35.1 g/dL (ref 32.0–36.0)
MCV: 84.1 fL (ref 80.0–100.0)
Monocytes Absolute: 0.5 10*3/uL (ref 0.2–0.9)
Monocytes Relative: 5 %
Neutro Abs: 6.9 10*3/uL — ABNORMAL HIGH (ref 1.4–6.5)
Neutrophils Relative %: 75 %
Platelets: 266 10*3/uL (ref 150–440)
RBC: 4.08 MIL/uL (ref 3.80–5.20)
RDW: 13.4 % (ref 11.5–14.5)
WBC: 9.2 10*3/uL (ref 3.6–11.0)

## 2017-02-28 LAB — COMPREHENSIVE METABOLIC PANEL
ALT: <5 U/L — ABNORMAL LOW (ref 14–54)
AST: 17 U/L (ref 15–41)
Albumin: 3.8 g/dL (ref 3.5–5.0)
Alkaline Phosphatase: 79 U/L (ref 38–126)
Anion gap: 8 (ref 5–15)
BUN: 20 mg/dL (ref 6–20)
CO2: 25 mmol/L (ref 22–32)
Calcium: 9.4 mg/dL (ref 8.9–10.3)
Chloride: 102 mmol/L (ref 101–111)
Creatinine, Ser: 1.4 mg/dL — ABNORMAL HIGH (ref 0.44–1.00)
GFR calc Af Amer: 44 mL/min — ABNORMAL LOW (ref >60)
GFR calc non Af Amer: 38 mL/min — ABNORMAL LOW (ref >60)
Glucose, Bld: 107 mg/dL — ABNORMAL HIGH (ref 65–99)
Potassium: 4.2 mmol/L (ref 3.5–5.1)
Sodium: 135 mmol/L (ref 135–145)
Total Bilirubin: 0.4 mg/dL (ref 0.3–1.2)
Total Protein: 7.1 g/dL (ref 6.5–8.1)

## 2017-02-28 NOTE — Progress Notes (Signed)
[facility] day:  02/28/2017  Chief Complaint: Victoria Hanson is a 66 y.o. female with metastatic colon cancer who is seen for 6 month assessment.  HPI:  The patient was last seen in the medical oncology clinic on 08/23/2016.  At that time, she had recently been diagnosed with Parkinson's disease.  Exam was stable.  AST was 45 (minimally elevated).  Follow-up LFTs on 10/23/2016 were normal.  During the interim, she has been "good".  She had an episode of passing "a lot" of bright red blood per rectum about a week ago. Prior to seeing the blood, she was having diarrhea and had to use "anti-diarrheals" for days.  Anti-diarrheals caused her to become constipated.  She was concurrently taking Ibuprofen.  Last colonoscopy was 04/25/2015; done by Dr. Vira Agar.   She denies nausea, vomiting, or weight loss. She is bruising easily. She is having a "pins and needle" sensation to her LEFT distal tib/fib and ankle area.   She has recently been diagnosed with Parkinson's.  She was started on Wellbutrin due to depression related to her Parkinson's diagnosis.  She recently saw pulmonology (Dr Alva Garnet) and diagnosed with asthma.    Past Medical History:  Diagnosis Date  . Anxiety   . Atypical chest pain    a. 2003 Cath: reportedly nl per pt [name]);  b. 04/2015 [place] Admission, neg troponin.  . Colon cancer ([place])    a. 1999: Pt had partial colectomy. Did develop lung metastasis. Had right upper lobectomy in 01/2007 then had associated chemotherapy.  Marland Kitchen COPD (chronic obstructive pulmonary disease) ([place])   . DDD (degenerative disc disease), lumbosacral   . Depression   . Echocardiogram abnormal    a. 02/2010 Echo: EF 55-60%, mild LAE, no regional wall motion abnormalities  . Elevated transaminase level    a. ? NASH;  b. 06/2015 - nl LFTs.  . GERD (gastroesophageal reflux disease)   . Hematuria   . Holter monitor, abnormal    a. 12/2009 Holter monitoring: NSR,  rare PACs and PVCs.  Marland Kitchen Hypercholesterolemia   . Hyperlipidemia   . Hypertension   . Leaky heart valve    a. Per pt report - no evidence of valvular abnormalities on prior echoes.  . Lung cancer ([place])    a. S/P resection (lobectomy - right)  . Morbid obesity ([place])   . Nephrolithiasis   . Parkinson disease ([place]) 08/2016   [name]    Past Surgical History:  Procedure Laterality Date  . BREAST EXCISIONAL BIOPSY Right 10/07/2009   benign  . CARDIAC CATHETERIZATION  2003   negative as per pt report  . CHOLECYSTECTOMY  2000  . COLON SURGERY     Colon cancer, removed part of large colon  . COLONOSCOPY N/A 04/25/2015   Procedure: COLONOSCOPY;  Surgeon: Manya Silvas, MD;  Location: [facility] ENDOSCOPY;  Service: Endoscopy;  Laterality: N/A;  . LOBECTOMY     Right lung, upper lobe right side removed  . TOTAL ABDOMINAL HYSTERECTOMY     abnormal bleeding  . TUBAL LIGATION  1980    Family History  Problem Relation Age of Onset  . Coronary artery disease Mother        died @ 71 of MI.  . Mental illness Mother   . Diabetes Mother   . Heart attack Father 77       MI  . Cancer Father        lung - died in his  mid-70's  . Hypertension Father   . Diabetes Father   . Sudden death Brother   . Mental illness Brother   . Heart attack Brother        MI @ 70, died @ age 52.  . Breast cancer Neg Hx     Social History:  reports that she is a non-smoker but has been exposed to tobacco smoke. She has never used smokeless tobacco. She reports that she drinks about 0.6 oz of alcohol per week . She reports that she does not use drugs.  She lives in [place].  The patient is accompanied by her daughter, [name], today.  Allergies:  Allergies  Allergen Reactions  . Macrobid [Nitrofurantoin Macrocrystal] Shortness Of Breath and Palpitations  . Antihistamines, Diphenhydramine-Type Other (See Comments)    She does not know which antihistamine she has an allergic reaction to.  . Aspirin Other (See  Comments)    Makes her sick on the stomach if she takes too many and causes bleeding.  Of note, she can take ibuprofen.  . Ceftin [Cefuroxime Axetil] Other (See Comments)    Chest pains  . Celecoxib Other (See Comments)    Reaction: Blood Disorder GI bleeding.  Marland Kitchen Cymbalta [Duloxetine Hcl] Other (See Comments)    Reaction: unknown  . Diphenhydramine Other (See Comments)  . Escitalopram Other (See Comments)  . Lexapro [Escitalopram Oxalate] Other (See Comments)    Reaction: Difficulty with speech  . Metronidazole Other (See Comments)    Other reaction(s): Unknown  . Nitrofurantoin Other (See Comments)  . Zoloft [Sertraline Hcl]   . Ciprofloxacin Other (See Comments)    Reaction: unknown    Current Medications: Current Outpatient Prescriptions  Medication Sig Dispense Refill  . albuterol (PROVENTIL HFA;VENTOLIN HFA) 108 (90 Base) MCG/ACT inhaler Inhale 2 puffs into the lungs every 6 (six) hours as needed for wheezing. 1 Inhaler 2  . ALPRAZolam (XANAX) 0.5 MG tablet Take 1 tablet (0.5 mg total) by mouth daily as needed for anxiety. 30 tablet 0  . aspirin EC 81 MG tablet Take 81 mg by mouth daily.    Marland Kitchen buPROPion (WELLBUTRIN) 100 MG tablet     . carbidopa-levodopa (SINEMET IR) 25-250 MG tablet Take 1 tablet by mouth 3 (three) times daily.    . fluticasone-salmeterol (ADVAIR HFA) 115-21 MCG/ACT inhaler Inhale 2 puffs into the lungs 2 (two) times daily. 3 Inhaler 4  . Multiple Vitamin (MULTIVITAMIN) tablet Take 1 tablet by mouth daily.    Marland Kitchen omeprazole (PRILOSEC) 20 MG capsule Take 1 capsule (20 mg total) by mouth daily. 30 capsule 2  . potassium chloride SA (K-DUR,KLOR-CON) 20 MEQ tablet TAKE ONE TABLET BY MOUTH TWICE A DAY 60 tablet 3  . traZODone (DESYREL) 50 MG tablet TAKE ONE AND ONE-HALF TABLET BY MOUTH ATBEDTIME 45 tablet 1  . triamterene-hydrochlorothiazide (MAXZIDE-25) 37.5-25 MG tablet TAKE 1 TABLET BY MOUTH EVERY DAY 30 tablet 3  . triamterene-hydrochlorothiazide (MAXZIDE-25)  37.5-25 MG tablet TAKE 1 TABLET BY MOUTH EVERY DAY 30 tablet 1   No current facility-administered medications for this visit.     Review of Systems:  GENERAL:  Feels "good".  No fevers or sweats.  Weight down 2 pounds. PERFORMANCE STATUS (ECOG):  1 HEENT:  No visual changes, runny nose, sore throat, mouth sores or tenderness. Lungs: COPD.  Asthma.  Shortness of breath with exertion.  No cough.  No hemoptysis. Cardiac:  No chest pain, palpitations, orthopnea, or PND. GI:  Hematochezia (see HPI).  Irregular bowel movements.  Nausea, intermittent.  No vomiting or melena.  Colonoscopy 04/2015. GU:  No urgency, frequency, dysuria, or hematuria. Musculoskeletal:  No back pain.  No joint pain.  No muscle tenderness. Extremities:  No pain or swelling. Skin:  No rashes or skin changes. Neuro:  Parkinson's disease.  Pins and needle sensation left tibia.  No headache, numbness or weakness, balance or coordination issues. Endocrine:  No diabetes, thyroid issues, hot flashes or night sweats. Psych:  On Wellbutrin since diagnosis of Parkinson's (see HPI).  No anxiety. Pain:  No focal pain. Review of systems:  All other systems reviewed and found to be negative.  Physical Exam: Blood pressure 127/82, pulse 67, temperature (!) 97.4 F (36.3 C), temperature source Tympanic, resp. rate 16, weight 190 lb 4.8 oz (86.3 kg). GENERAL:  Well developed, well nourished, woman sitting comfortably in the exam room in no acute distress. MENTAL STATUS:  Alert and oriented to person, place and time. HEAD:  Short gray hair.  Normocephalic, atraumatic, face symmetric, no Cushingoid features. EYES:  Glasses.  Pupils equal round and reactive to light and accomodation.  No conjunctivitis or scleral icterus. ENT:  Oropharynx clear without lesion.  Tongue normal. Mucous membranes moist.  RESPIRATORY:  Clear to auscultation without rales, wheezes or rhonchi. CARDIOVASCULAR:  Regular rate and rhythm without murmur, rub or  gallop. ABDOMEN:  Soft, non-tender, with active bowel sounds, and no appreciable hepatosplenomegaly.  No masses. SKIN:  Ecchymosis upper extremities.  No rashes, ulcers or lesions. EXTREMITIES:  Chronic lower extremity changes.  No skin discoloration or tenderness.  No palpable cords. LYMPH NODES: No palpable cervical, supraclavicular, axillary or inguinal adenopathy  NEUROLOGICAL: Unremarkable. PSYCH:  Appropriate.   Appointment on 02/28/2017  Component Date Value Ref Range Status  . WBC 02/28/2017 9.2  3.6 - 11.0 K/uL Final  . RBC 02/28/2017 4.08  3.80 - 5.20 MIL/uL Final  . Hemoglobin 02/28/2017 12.0  12.0 - 16.0 g/dL Final  . HCT 02/28/2017 34.3* 35.0 - 47.0 % Final  . MCV 02/28/2017 84.1  80.0 - 100.0 fL Final  . MCH 02/28/2017 29.5  26.0 - 34.0 pg Final  . MCHC 02/28/2017 35.1  32.0 - 36.0 g/dL Final  . RDW 02/28/2017 13.4  11.5 - 14.5 % Final  . Platelets 02/28/2017 266  150 - 440 K/uL Final  . Neutrophils Relative % 02/28/2017 75  % Final  . Neutro Abs 02/28/2017 6.9* 1.4 - 6.5 K/uL Final  . Lymphocytes Relative 02/28/2017 18  % Final  . Lymphs Abs 02/28/2017 1.6  1.0 - 3.6 K/uL Final  . Monocytes Relative 02/28/2017 5  % Final  . Monocytes Absolute 02/28/2017 0.5  0.2 - 0.9 K/uL Final  . Eosinophils Relative 02/28/2017 1  % Final  . Eosinophils Absolute 02/28/2017 0.1  0 - 0.7 K/uL Final  . Basophils Relative 02/28/2017 1  % Final  . Basophils Absolute 02/28/2017 0.1  0 - 0.1 K/uL Final  . Sodium 02/28/2017 135  135 - 145 mmol/L Final  . Potassium 02/28/2017 4.2  3.5 - 5.1 mmol/L Final  . Chloride 02/28/2017 102  101 - 111 mmol/L Final  . CO2 02/28/2017 25  22 - 32 mmol/L Final  . Glucose, Bld 02/28/2017 107* 65 - 99 mg/dL Final  . BUN 02/28/2017 20  6 - 20 mg/dL Final  . Creatinine, Ser 02/28/2017 1.40* 0.44 - 1.00 mg/dL Final  . Calcium 02/28/2017 9.4  8.9 - 10.3 mg/dL Final  . Total Protein 02/28/2017 7.1  6.5 - 8.1  g/dL Final  . Albumin 02/28/2017 3.8  3.5 - 5.0  g/dL Final  . AST 02/28/2017 17  15 - 41 U/L Final  . ALT 02/28/2017 <5* 14 - 54 U/L Final  . Alkaline Phosphatase 02/28/2017 79  38 - 126 U/L Final  . Total Bilirubin 02/28/2017 0.4  0.3 - 1.2 mg/dL Final  . GFR calc non Af Amer 02/28/2017 38* >60 mL/min Final  . GFR calc Af Amer 02/28/2017 44* >60 mL/min Final   Comment: (NOTE) The eGFR has been calculated using the CKD EPI equation. This calculation has not been validated in all clinical situations. eGFR's persistently <60 mL/min signify possible Chronic Kidney Disease.   . Anion gap 02/28/2017 8  5 - 15 Final    Assessment:  NAKOMA GOTWALT is a 66 y.o. female with metastatic colon cancer s/p partial colectomy in 10/1998.   Pathologic stage was [id number] (no details are available).  She did not receive chemotherapy.  Imaging studies revealed a right lung lesion.  She underwent right upper lobe lobectomy in 01/17/2007.  Pathology confirmed metastatic colon cancer.  She received 6 months of FOLFOX chemotherapy (completed 08/2007).  Course was truncated secondary to neuropathy.  Patient notes recurrence was not associated with an elevated CEA.  Abdomen and pelvic CT on 02/16/2015 revealed no evidence of metastatic disease.  Chest, abdomen, and pelvic CT on 03/01/2016 revealed no evidence of recurrent or metastatic carcinoma.  Colonoscopy on 04/25/2015 revealed only internal hemorrhoids.  CXR on 11/07/2015 revealed minimal bibasilar atelectasis.  Head MRI on 09/01/2015 was normal.  CEA has been followed: 0.8 on 12/26/2011, 0.6 on 10/29/2012, 0.9 on 10/26/2013, 0.8 on 05/17/2015, 1.1 on 08/23/2016, and 0.9 on 02/28/2017.   Symptomatically, she had a single episode of passing BRBPR (large volume).  She has new "asthma" symptoms.  Exam is stable.  Blood counts are stable. Creatinine is elevated at 1.40.   Plan: 1.  Labs today:  CBC with diff, CMP, CEA. 2.  Refer back to Dr. Gaylyn Cheers for evaluation of rectal bleeding.  Consider repeat  colonoscopy.   3.  Schedule non-contrast chest CT on 03/06/2017 secondary to history of RUL lung metastasis and new "asthma" diagnosis.  Patient concerned about cancer recurrence and requests new imaging.  4.  RTC after chest CT for MD assessment and review of CT scan.    Honor Loh, NP  02/28/2017, 2:26 PM   I saw and evaluated the patient, participating in the key portions of the service and reviewing pertinent diagnostic studies and records.  I reviewed the nurse practitioner's note and agree with the findings and the plan.  The assessment and plan were discussed with the patient.  Additional diagnostic studies of chest CT is needed to follow-up prior h/o lung metastasis and would change the clinical management.  A few questions were asked by the patient and answered.   Lequita Asal, MD 02/28/2017,8:18 PM

## 2017-02-28 NOTE — Progress Notes (Signed)
Patient is here today for a follow up. Patient states no new concerns today.  

## 2017-03-01 ENCOUNTER — Ambulatory Visit: Payer: Medicare HMO

## 2017-03-01 LAB — CEA: CEA: 0.9 ng/mL (ref 0.0–4.7)

## 2017-03-01 NOTE — Telephone Encounter (Signed)
Scheduled 05/06/17

## 2017-03-05 DIAGNOSIS — K625 Hemorrhage of anus and rectum: Secondary | ICD-10-CM | POA: Diagnosis not present

## 2017-03-05 DIAGNOSIS — K600 Acute anal fissure: Secondary | ICD-10-CM | POA: Diagnosis not present

## 2017-03-05 DIAGNOSIS — Z85038 Personal history of other malignant neoplasm of large intestine: Secondary | ICD-10-CM | POA: Diagnosis not present

## 2017-03-06 ENCOUNTER — Ambulatory Visit
Admission: RE | Admit: 2017-03-06 | Discharge: 2017-03-06 | Disposition: A | Discharge status: Home / Self Care | Payer: Medicare HMO | Source: Ambulatory Visit | Attending: Urgent Care | Admitting: Urgent Care

## 2017-03-06 DIAGNOSIS — J9811 Atelectasis: Secondary | ICD-10-CM | POA: Insufficient documentation

## 2017-03-06 DIAGNOSIS — J984 Other disorders of lung: Secondary | ICD-10-CM | POA: Insufficient documentation

## 2017-03-06 DIAGNOSIS — C7801 Secondary malignant neoplasm of right lung: Secondary | ICD-10-CM

## 2017-03-06 DIAGNOSIS — C189 Malignant neoplasm of colon, unspecified: Secondary | ICD-10-CM | POA: Diagnosis not present

## 2017-03-06 DIAGNOSIS — Z85118 Personal history of other malignant neoplasm of bronchus and lung: Secondary | ICD-10-CM | POA: Insufficient documentation

## 2017-03-06 DIAGNOSIS — Z9889 Other specified postprocedural states: Secondary | ICD-10-CM | POA: Insufficient documentation

## 2017-03-06 DIAGNOSIS — Z902 Acquired absence of lung [part of]: Secondary | ICD-10-CM | POA: Insufficient documentation

## 2017-03-08 ENCOUNTER — Other Ambulatory Visit: Payer: Self-pay | Admitting: *Deleted

## 2017-03-08 ENCOUNTER — Encounter: Payer: Self-pay | Admitting: Hematology and Oncology

## 2017-03-08 ENCOUNTER — Inpatient Hospital Stay (HOSPITAL_BASED_OUTPATIENT_CLINIC_OR_DEPARTMENT_OTHER): Payer: Medicare HMO | Admitting: Hematology and Oncology

## 2017-03-08 VITALS — BP 127/82 | HR 73 | Temp 96.9°F | Resp 20 | Wt 192.2 lb

## 2017-03-08 DIAGNOSIS — Z79899 Other long term (current) drug therapy: Secondary | ICD-10-CM | POA: Diagnosis not present

## 2017-03-08 DIAGNOSIS — J449 Chronic obstructive pulmonary disease, unspecified: Secondary | ICD-10-CM | POA: Diagnosis not present

## 2017-03-08 DIAGNOSIS — Z85118 Personal history of other malignant neoplasm of bronchus and lung: Secondary | ICD-10-CM

## 2017-03-08 DIAGNOSIS — C7801 Secondary malignant neoplasm of right lung: Secondary | ICD-10-CM

## 2017-03-08 DIAGNOSIS — K625 Hemorrhage of anus and rectum: Secondary | ICD-10-CM | POA: Diagnosis not present

## 2017-03-08 DIAGNOSIS — G20 Parkinson's disease: Secondary | ICD-10-CM | POA: Diagnosis not present

## 2017-03-08 DIAGNOSIS — C189 Malignant neoplasm of colon, unspecified: Secondary | ICD-10-CM

## 2017-03-08 DIAGNOSIS — K602 Anal fissure, unspecified: Secondary | ICD-10-CM

## 2017-03-08 DIAGNOSIS — Z85038 Personal history of other malignant neoplasm of large intestine: Secondary | ICD-10-CM

## 2017-03-08 DIAGNOSIS — K648 Other hemorrhoids: Secondary | ICD-10-CM | POA: Diagnosis not present

## 2017-03-08 DIAGNOSIS — I10 Essential (primary) hypertension: Secondary | ICD-10-CM | POA: Diagnosis not present

## 2017-03-08 DIAGNOSIS — Z9221 Personal history of antineoplastic chemotherapy: Secondary | ICD-10-CM | POA: Diagnosis not present

## 2017-03-08 NOTE — Progress Notes (Signed)
Patient here today for CT results. 

## 2017-03-08 NOTE — Progress Notes (Signed)
[facility] day:  03/08/2017  Chief Complaint: Victoria Hanson is a 66 y.o. female with metastatic colon cancer who is seen for 6 month assessment.  HPI:  The patient was last seen in the medical oncology clinic on 02/28/2017.  At that time, she described a single episode of passing BRBPR (large volume).  She had new "asthma" symptoms.  Exam was stable.  Blood counts were stable. Creatinine was elevated at 1.40.  CEA was normal.  She was referred back to Dr. Vira Agar for re-evaluation.  Imaging studies were ordered.   She was seen by Denice Paradise, NP at the [facility] on 03/05/2017.  She was felt to have an anal fissure.  She was prescribed Colace 1-2 x/day and topical Nifedipine cream.  She has a follow-up in 1 month.  Chest CT on 03/06/2017 revealed no acute findings.  There was no evidence of pneumonia or pulmonary edema.  There were postsurgical changes on the right from a right upper lobectomy.  There were mild areas of lung scarring in left lower lobe dependent subsegmental atelectasis.  There was no evidence of malignancy..   During the interim, she has done "ok". Patient has not had any more episodes of passing blood in her stools. She has had no recurrent constipation on the Colace. She has not started taking the topical Nifedipine. Patient's breathing has been "better". She is eating well; no weight loss.    Past Medical History:  Diagnosis Date  . Anxiety   . Atypical chest pain    a. 2003 Cath: reportedly nl per pt [name]);  b. 04/2015 [place] Admission, neg troponin.  . Colon cancer ([place])    a. 1999: Pt had partial colectomy. Did develop lung metastasis. Had right upper lobectomy in 01/2007 then had associated chemotherapy.  Marland Kitchen COPD (chronic obstructive pulmonary disease) ([place])   . DDD (degenerative disc disease), lumbosacral   . Depression   . Echocardiogram abnormal    a. 02/2010 Echo: EF 55-60%, mild LAE, no regional wall  motion abnormalities  . Elevated transaminase level    a. ? NASH;  b. 06/2015 - nl LFTs.  . GERD (gastroesophageal reflux disease)   . Hematuria   . Holter monitor, abnormal    a. 12/2009 Holter monitoring: NSR, rare PACs and PVCs.  Marland Kitchen Hypercholesterolemia   . Hyperlipidemia   . Hypertension   . Leaky heart valve    a. Per pt report - no evidence of valvular abnormalities on prior echoes.  . Lung cancer ([place])    a. S/P resection (lobectomy - right)  . Morbid obesity ([place])   . Nephrolithiasis   . Parkinson disease ([place]) 08/2016   [name]    Past Surgical History:  Procedure Laterality Date  . BREAST EXCISIONAL BIOPSY Right 10/07/2009   benign  . CARDIAC CATHETERIZATION  2003   negative as per pt report  . CHOLECYSTECTOMY  2000  . COLON SURGERY     Colon cancer, removed part of large colon  . COLONOSCOPY N/A 04/25/2015   Procedure: COLONOSCOPY;  Surgeon: Manya Silvas, MD;  Location: [facility] ENDOSCOPY;  Service: Endoscopy;  Laterality: N/A;  . LOBECTOMY     Right lung, upper lobe right side removed  . TOTAL ABDOMINAL HYSTERECTOMY     abnormal bleeding  . TUBAL LIGATION  1980    Family History  Problem Relation Age of Onset  . Coronary artery disease Mother  died @ 72 of MI.  . Mental illness Mother   . Diabetes Mother   . Heart attack Father 57       MI  . Cancer Father        lung - died in his mid-70's  . Hypertension Father   . Diabetes Father   . Sudden death Brother   . Mental illness Brother   . Heart attack Brother        MI @ 64, died @ age 22.  . Breast cancer Neg Hx     Social History:  reports that she is a non-smoker but has been exposed to tobacco smoke. She has never used smokeless tobacco. She reports that she drinks about 0.6 oz of alcohol per week . She reports that she does not use drugs.  She lives in [place].  The patient is accompanied by her daughter, Victoria Hanson, today.  Allergies:  Allergies  Allergen Reactions  . Macrobid [Nitrofurantoin  Macrocrystal] Shortness Of Breath and Palpitations  . Antihistamines, Diphenhydramine-Type Other (See Comments)    She does not know which antihistamine she has an allergic reaction to.  . Aspirin Other (See Comments)    Makes her sick on the stomach if she takes too many and causes bleeding.  Of note, she can take ibuprofen.  . Ceftin [Cefuroxime Axetil] Other (See Comments)    Chest pains  . Celecoxib Other (See Comments)    Reaction: Blood Disorder GI bleeding.  Marland Kitchen Cymbalta [Duloxetine Hcl] Other (See Comments)    Reaction: unknown  . Diphenhydramine Other (See Comments)  . Escitalopram Other (See Comments)  . Lexapro [Escitalopram Oxalate] Other (See Comments)    Reaction: Difficulty with speech  . Metronidazole Other (See Comments)    Other reaction(s): Unknown  . Nitrofurantoin Other (See Comments)  . Zoloft [Sertraline Hcl]   . Ciprofloxacin Other (See Comments)    Reaction: unknown    Current Medications: Current Outpatient Prescriptions  Medication Sig Dispense Refill  . albuterol (PROVENTIL HFA;VENTOLIN HFA) 108 (90 Base) MCG/ACT inhaler Inhale 2 puffs into the lungs every 6 (six) hours as needed for wheezing. 1 Inhaler 2  . ALPRAZolam (XANAX) 0.5 MG tablet Take 1 tablet (0.5 mg total) by mouth daily as needed for anxiety. 30 tablet 0  . aspirin EC 81 MG tablet Take 81 mg by mouth daily.    Marland Kitchen buPROPion (WELLBUTRIN) 100 MG tablet     . carbidopa-levodopa (SINEMET IR) 25-250 MG tablet Take 1 tablet by mouth 3 (three) times daily.    . fluticasone-salmeterol (ADVAIR HFA) 115-21 MCG/ACT inhaler Inhale 2 puffs into the lungs 2 (two) times daily. 3 Inhaler 4  . Multiple Vitamin (MULTIVITAMIN) tablet Take 1 tablet by mouth daily.    Marland Kitchen omeprazole (PRILOSEC) 20 MG capsule Take 1 capsule (20 mg total) by mouth daily. 30 capsule 2  . potassium chloride SA (K-DUR,KLOR-CON) 20 MEQ tablet TAKE ONE TABLET BY MOUTH TWICE A DAY 60 tablet 3  . traZODone (DESYREL) 50 MG tablet TAKE ONE AND  ONE-HALF TABLET BY MOUTH ATBEDTIME 45 tablet 1  . triamterene-hydrochlorothiazide (MAXZIDE-25) 37.5-25 MG tablet TAKE 1 TABLET BY MOUTH EVERY DAY 30 tablet 3  . triamterene-hydrochlorothiazide (MAXZIDE-25) 37.5-25 MG tablet TAKE 1 TABLET BY MOUTH EVERY DAY 30 tablet 1   No current facility-administered medications for this visit.     Review of Systems:  GENERAL:  Feels "good".  No fevers or sweats.  Weight up 2 pounds. PERFORMANCE STATUS (ECOG):  1 HEENT:  No visual changes, runny nose, sore throat, mouth sores or tenderness. Lungs: COPD.  Asthma.  Shortness of breath with exertion.  No cough.  No hemoptysis. Cardiac:  No chest pain, palpitations, orthopnea, or PND. GI:  Hematochezia secondary to anal fissure, resolved.  Irregular bowel movements. Nausea, intermittent.  No vomiting or melena.  Colonoscopy 04/2015. GU:  No urgency, frequency, dysuria, or hematuria. Musculoskeletal:  No back pain.  No joint pain.  No muscle tenderness. Extremities:  No pain or swelling. Skin:  No rashes or skin changes. Neuro:  Parkinson's disease.  Pins and needle sensation left tibia.  No headache, numbness or weakness, balance or coordination issues. Endocrine:  No diabetes, thyroid issues, hot flashes or night sweats. Psych:  On Wellbutrin since diagnosis of Parkinson's.  No anxiety. Pain:  No focal pain. Review of systems:  All other systems reviewed and found to be negative.  Physical Exam: Blood pressure 127/82, pulse 73, temperature (!) 96.9 F (36.1 C), temperature source Tympanic, resp. rate 20, weight 192 lb 4 oz (87.2 kg). GENERAL:  Well developed, well nourished, woman sitting comfortably in the exam room in no acute distress. MENTAL STATUS:  Alert and oriented to person, place and time. HEAD:  Short gray hair.  Normocephalic, atraumatic, face symmetric, no Cushingoid features. EYES:  Glasses.  No conjunctivitis or scleral icterus. NEUROLOGICAL: Unremarkable. PSYCH:  Appropriate.   No  visits with results within 3 Day(s) from this visit.  Latest known visit with results is:  Appointment on 02/28/2017  Component Date Value Ref Range Status  . WBC 02/28/2017 9.2  3.6 - 11.0 K/uL Final  . RBC 02/28/2017 4.08  3.80 - 5.20 MIL/uL Final  . Hemoglobin 02/28/2017 12.0  12.0 - 16.0 g/dL Final  . HCT 02/28/2017 34.3* 35.0 - 47.0 % Final  . MCV 02/28/2017 84.1  80.0 - 100.0 fL Final  . MCH 02/28/2017 29.5  26.0 - 34.0 pg Final  . MCHC 02/28/2017 35.1  32.0 - 36.0 g/dL Final  . RDW 02/28/2017 13.4  11.5 - 14.5 % Final  . Platelets 02/28/2017 266  150 - 440 K/uL Final  . Neutrophils Relative % 02/28/2017 75  % Final  . Neutro Abs 02/28/2017 6.9* 1.4 - 6.5 K/uL Final  . Lymphocytes Relative 02/28/2017 18  % Final  . Lymphs Abs 02/28/2017 1.6  1.0 - 3.6 K/uL Final  . Monocytes Relative 02/28/2017 5  % Final  . Monocytes Absolute 02/28/2017 0.5  0.2 - 0.9 K/uL Final  . Eosinophils Relative 02/28/2017 1  % Final  . Eosinophils Absolute 02/28/2017 0.1  0 - 0.7 K/uL Final  . Basophils Relative 02/28/2017 1  % Final  . Basophils Absolute 02/28/2017 0.1  0 - 0.1 K/uL Final  . CEA 02/28/2017 0.9  0.0 - 4.7 ng/mL Final   Comment: (NOTE)       Roche ECLIA methodology       Nonsmokers  <3.9                                     Smokers     <5.6 Performed At: [facility] [place], [place] [id number] Lindon Romp MD TD:[id number]   . Sodium 02/28/2017 135  135 - 145 mmol/L Final  . Potassium 02/28/2017 4.2  3.5 - 5.1 mmol/L Final  . Chloride 02/28/2017 102  101 - 111 mmol/L Final  . CO2 02/28/2017 25  22 - 32 mmol/L Final  . Glucose, Bld 02/28/2017 107* 65 - 99 mg/dL Final  . BUN 02/28/2017 20  6 - 20 mg/dL Final  . Creatinine, Ser 02/28/2017 1.40* 0.44 - 1.00 mg/dL Final  . Calcium 02/28/2017 9.4  8.9 - 10.3 mg/dL Final  . Total Protein 02/28/2017 7.1  6.5 - 8.1 g/dL Final  . Albumin 02/28/2017 3.8  3.5 - 5.0 g/dL Final  . AST 02/28/2017 17  15 - 41 U/L  Final  . ALT 02/28/2017 <5* 14 - 54 U/L Final  . Alkaline Phosphatase 02/28/2017 79  38 - 126 U/L Final  . Total Bilirubin 02/28/2017 0.4  0.3 - 1.2 mg/dL Final  . GFR calc non Af Amer 02/28/2017 38* >60 mL/min Final  . GFR calc Af Amer 02/28/2017 44* >60 mL/min Final   Comment: (NOTE) The eGFR has been calculated using the CKD EPI equation. This calculation has not been validated in all clinical situations. eGFR's persistently <60 mL/min signify possible Chronic Kidney Disease.   . Anion gap 02/28/2017 8  5 - 15 Final    Assessment:  NISA DECAIRE is a 66 y.o. female with metastatic colon cancer s/p partial colectomy in 10/1998.   Pathologic stage was [id number] (no details are available).  She did not receive chemotherapy.  Imaging studies revealed a right lung lesion.  She underwent right upper lobe lobectomy in 01/17/2007.  Pathology confirmed metastatic colon cancer.  She received 6 months of FOLFOX chemotherapy (completed 08/2007).  Course was truncated secondary to neuropathy.  Patient notes recurrence was not associated with an elevated CEA.  Abdomen and pelvic CT on 02/16/2015 revealed no evidence of metastatic disease.  Chest, abdomen, and pelvic CT on 03/01/2016 revealed no evidence of recurrent or metastatic carcinoma.  Chest CT on 03/06/2017 revealed postsurgical changes on the right from a right upper lobectomy.  There was no evidence of malignancy..   Colonoscopy on 04/25/2015 revealed only internal hemorrhoids.  CXR on 11/07/2015 revealed minimal bibasilar atelectasis.  Head MRI on 09/01/2015 was normal.  CEA has been followed: 0.8 on 12/26/2011, 0.6 on 10/29/2012, 0.9 on 10/26/2013, 0.8 on 05/17/2015, 1.1 on 08/23/2016, and 0.9 on 02/28/2017.   Symptomatically, she has had no further rectal bleeding.   Etiology was felt secondary to a rectal fissure.  Her breathing has improved overall.  Exam is stable.    Plan: 1.  Review interval chest CT- no evidence of malignancy. 2.   Discuss interval GI appointment and management of rectal fissure.  Follow-up with GI in 1 month. 3.  RTC in 6 months for MD assessment and labs (CBC with diff, CMP, CEA).   Honor Loh, NP  03/08/2017, 9:33 AM   I saw and evaluated the patient, participating in the key portions of the service and reviewing pertinent diagnostic studies and records.  I reviewed the nurse practitioner's note and agree with the findings and the plan.  The assessment and plan were discussed with the patient.  A few questions were asked by the patient and answered.   Lequita Asal, MD 03/08/2017, 9:33 AM

## 2017-03-19 ENCOUNTER — Other Ambulatory Visit: Payer: Self-pay | Admitting: Internal Medicine

## 2017-03-26 IMAGING — CT CT HEAD W/O CM
1 series · 15 of 30 positions shown, 19 images · non-contrast
Comparison: MRI of the brain performed 02/13/2013, and CT of the
head performed 10/27/2010

CLINICAL DATA: Chronic bilateral headache.  Initial encounter.

EXAM:
CT HEAD WITHOUT CONTRAST
TECHNIQUE: Contiguous axial images were obtained from the base of the skull
through the vertex without intravenous contrast.

[Series 2: head wo · axial · 0.41mm/px · z∈[+338,+482]mm · 15 of 36 slices shown, 19 images]
[im 2/36  brain]
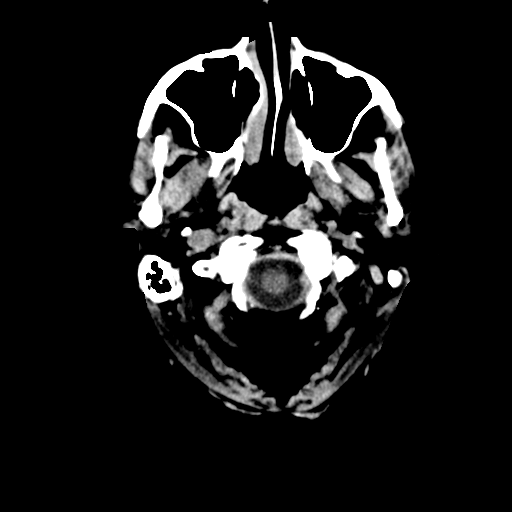
[im 2/36  bone]
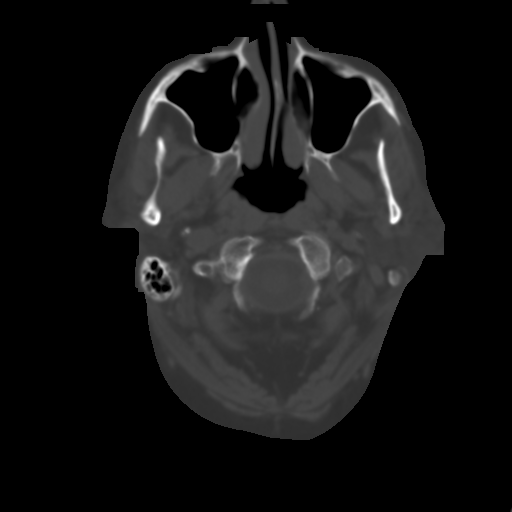
[im 4/36  brain]
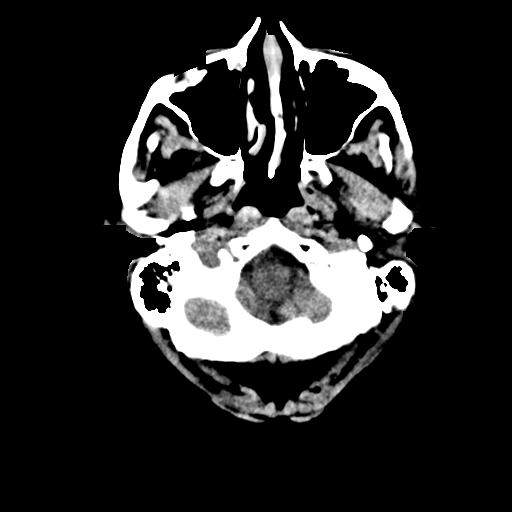
[im 7/36  brain]
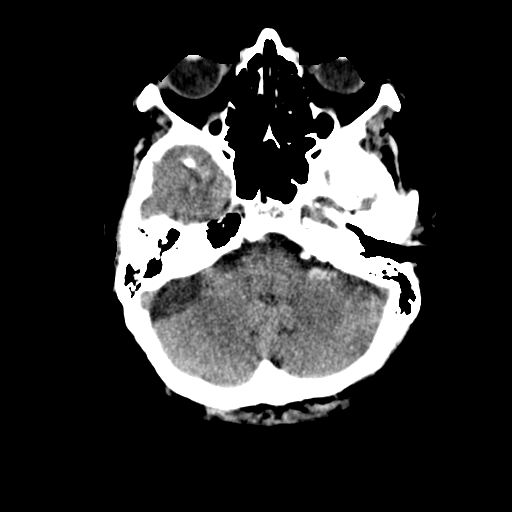
[im 9/36  brain]
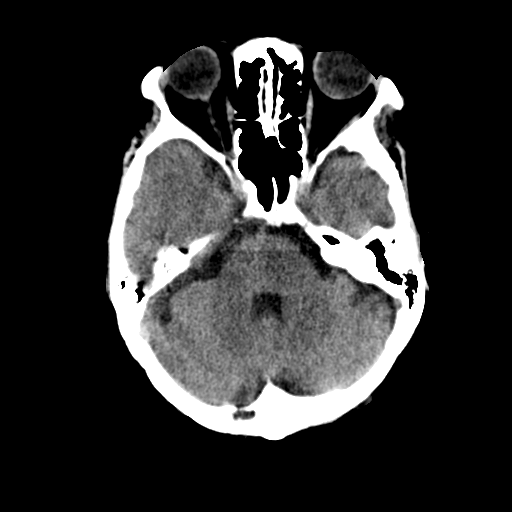
[im 11/36  brain]
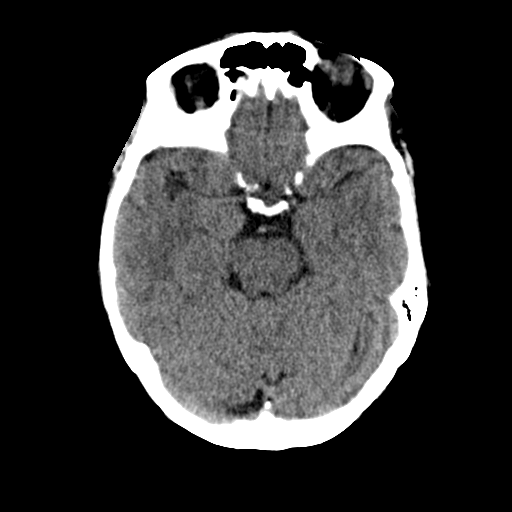
[im 11/36  bone]
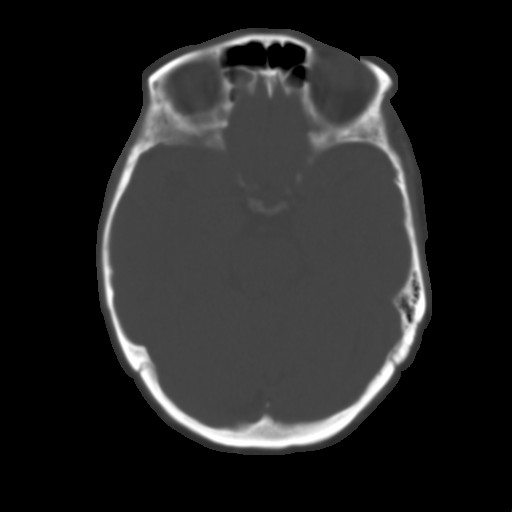
[im 14/36  brain]
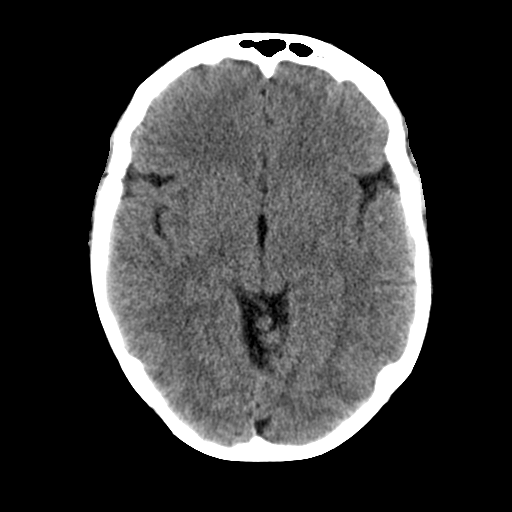
[im 16/36  brain]
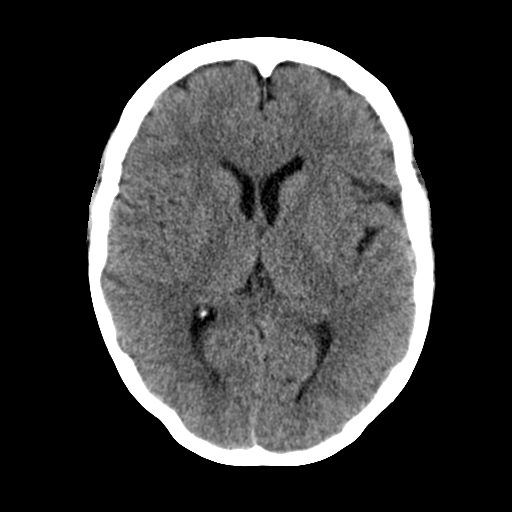
[im 19/36  brain]
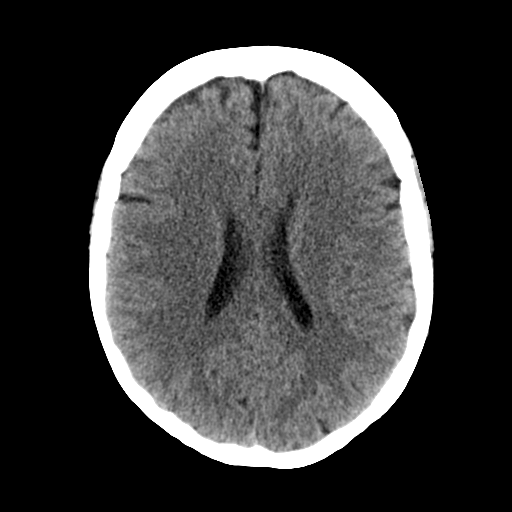
[im 20/36  brain]
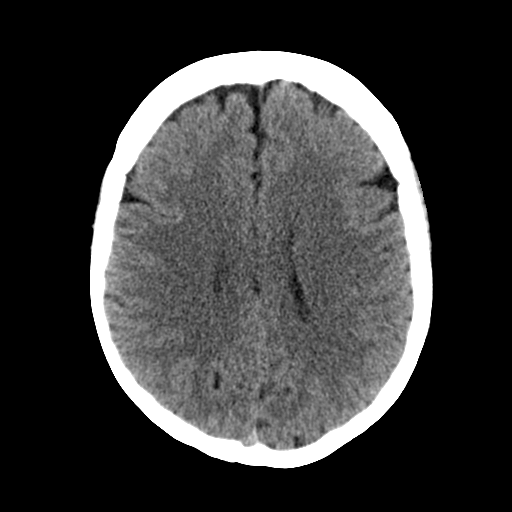
[im 20/36  bone]
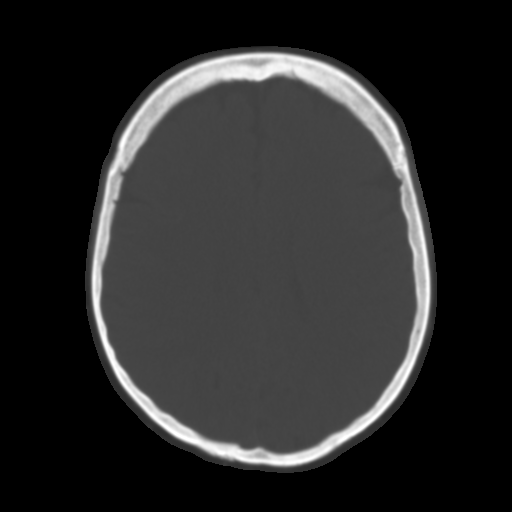
[im 22/36  brain]
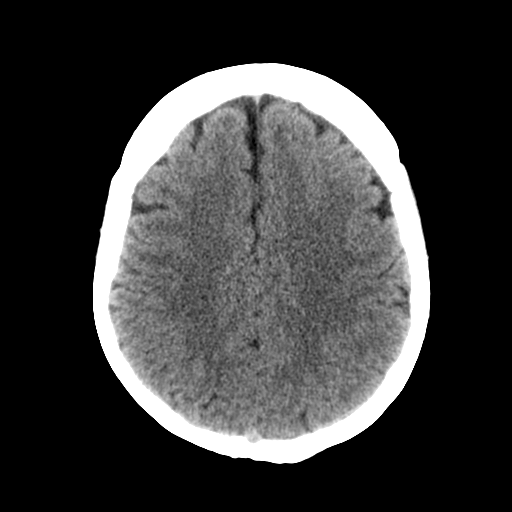
[im 25/36  brain]
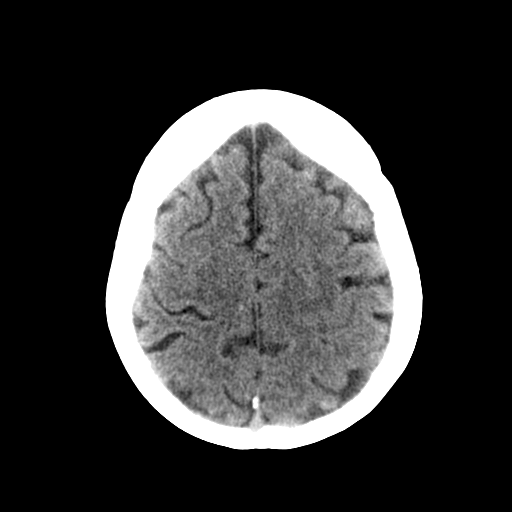
[im 27/36  brain]
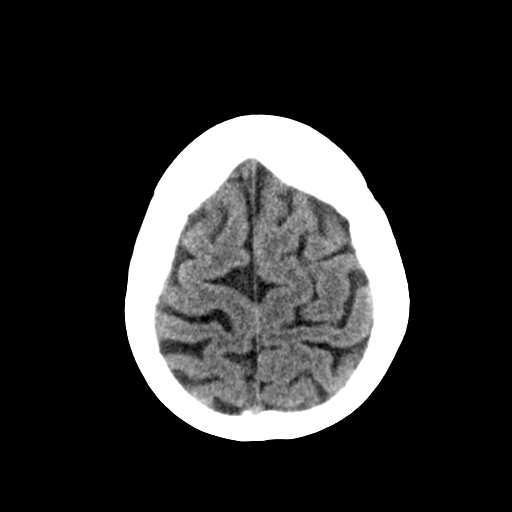
[im 29/36  brain]
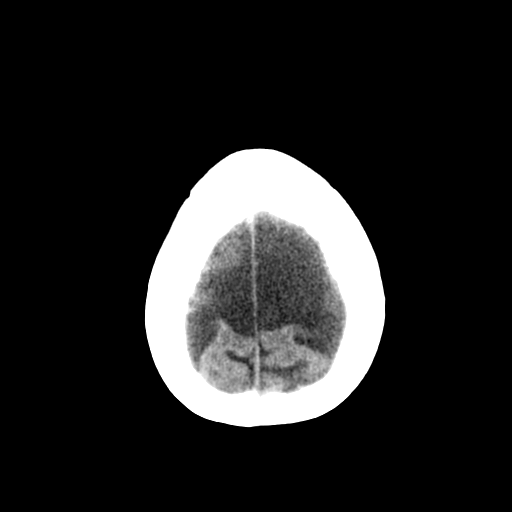
[im 29/36  bone]
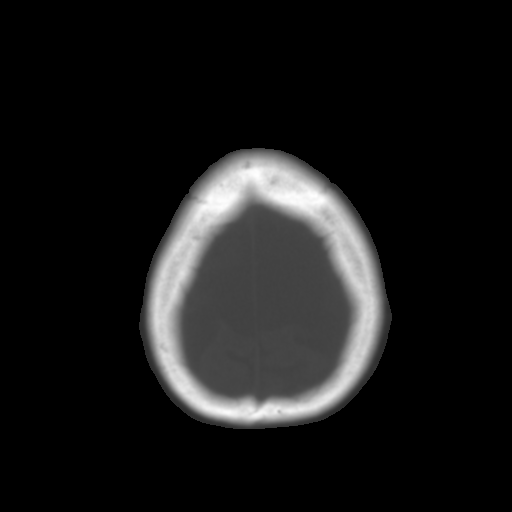
[im 32/36  brain]
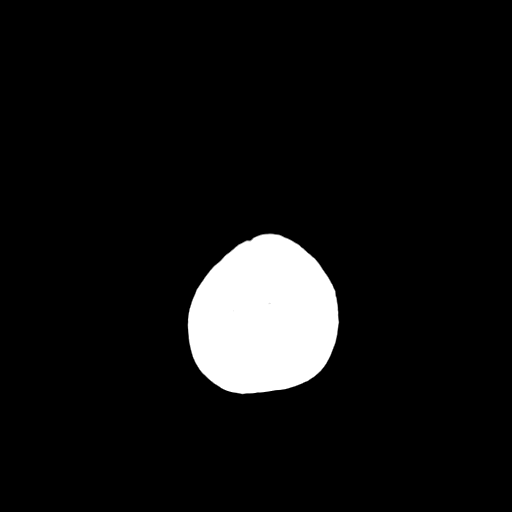
[im 34/36  brain]
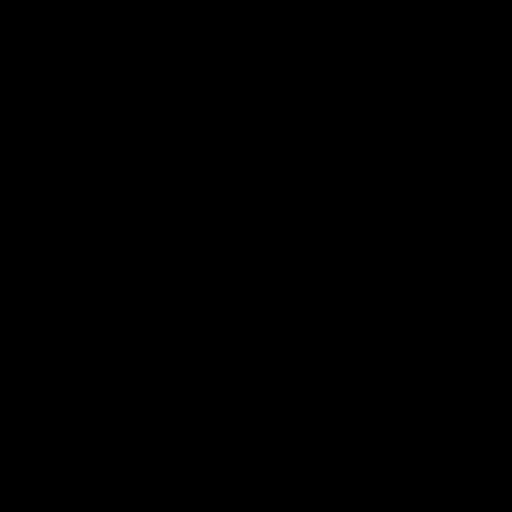

[15 of 30 positions shown; findings below may reference images not displayed]

FINDINGS: There is no evidence of acute infarction, mass lesion, or intra- or
extra-axial hemorrhage on CT.

Mild cerebellar atrophy is suggested. Mild periventricular white
matter change likely reflects small vessel ischemic microangiopathy.

The brainstem and fourth ventricle are within normal limits. The
third and lateral ventricles, and basal ganglia are unremarkable in
appearance. The cerebral hemispheres are symmetric in appearance,
with normal gray-white differentiation. No mass effect or midline
shift is seen.

There is no evidence of fracture; visualized osseous structures are
unremarkable in appearance. The visualized portions of the orbits
are within normal limits. The paranasal sinuses and mastoid air
cells are well-aerated. No significant soft tissue abnormalities are
seen.
IMPRESSION: 1. No acute intracranial pathology seen on CT.
2. Mild small vessel ischemic microangiopathy. Suggestion of mild
cerebellar atrophy.

## 2017-04-17 ENCOUNTER — Encounter: Payer: Self-pay | Admitting: Internal Medicine

## 2017-04-17 ENCOUNTER — Ambulatory Visit: Payer: Medicare HMO | Admitting: Internal Medicine

## 2017-04-17 VITALS — BP 128/80 | HR 78 | Temp 97.7°F | Resp 14 | Ht 64.0 in | Wt 193.6 lb

## 2017-04-17 DIAGNOSIS — N183 Chronic kidney disease, stage 3 unspecified: Secondary | ICD-10-CM

## 2017-04-17 DIAGNOSIS — C189 Malignant neoplasm of colon, unspecified: Secondary | ICD-10-CM

## 2017-04-17 DIAGNOSIS — F419 Anxiety disorder, unspecified: Secondary | ICD-10-CM

## 2017-04-17 DIAGNOSIS — Z23 Encounter for immunization: Secondary | ICD-10-CM | POA: Diagnosis not present

## 2017-04-17 DIAGNOSIS — I10 Essential (primary) hypertension: Secondary | ICD-10-CM | POA: Diagnosis not present

## 2017-04-17 DIAGNOSIS — F330 Major depressive disorder, recurrent, mild: Secondary | ICD-10-CM

## 2017-04-17 DIAGNOSIS — R739 Hyperglycemia, unspecified: Secondary | ICD-10-CM | POA: Diagnosis not present

## 2017-04-17 DIAGNOSIS — C7801 Secondary malignant neoplasm of right lung: Secondary | ICD-10-CM | POA: Diagnosis not present

## 2017-04-17 DIAGNOSIS — Z6833 Body mass index (BMI) 33.0-33.9, adult: Secondary | ICD-10-CM

## 2017-04-17 DIAGNOSIS — K219 Gastro-esophageal reflux disease without esophagitis: Secondary | ICD-10-CM | POA: Diagnosis not present

## 2017-04-17 DIAGNOSIS — E7800 Pure hypercholesterolemia, unspecified: Secondary | ICD-10-CM

## 2017-04-17 DIAGNOSIS — I6523 Occlusion and stenosis of bilateral carotid arteries: Secondary | ICD-10-CM | POA: Diagnosis not present

## 2017-04-17 DIAGNOSIS — Z0000 Encounter for general adult medical examination without abnormal findings: Secondary | ICD-10-CM

## 2017-04-17 LAB — LIPID PANEL
CHOL/HDL RATIO: 4
CHOLESTEROL: 237 mg/dL — AB (ref 0–200)
HDL: 60.9 mg/dL (ref >39.00)
LDL Cholesterol: 148 mg/dL — ABNORMAL HIGH (ref 0–99)
NonHDL: 175.71
TRIGLYCERIDES: 140 mg/dL (ref 0.0–149.0)
VLDL: 28 mg/dL (ref 0.0–40.0)

## 2017-04-17 LAB — HEPATIC FUNCTION PANEL
ALBUMIN: 4 g/dL (ref 3.5–5.2)
ALT: 5 U/L (ref 0–35)
AST: 17 U/L (ref 0–37)
Alkaline Phosphatase: 92 U/L (ref 39–117)
BILIRUBIN DIRECT: 0 mg/dL (ref 0.0–0.3)
TOTAL PROTEIN: 7.4 g/dL (ref 6.0–8.3)
Total Bilirubin: 0.4 mg/dL (ref 0.2–1.2)

## 2017-04-17 LAB — URINALYSIS, ROUTINE W REFLEX MICROSCOPIC
Bilirubin Urine: NEGATIVE
HGB URINE DIPSTICK: NEGATIVE
Ketones, ur: NEGATIVE
NITRITE: NEGATIVE
RBC / HPF: NONE SEEN (ref 0–?)
SPECIFIC GRAVITY, URINE: 1.01 (ref 1.000–1.030)
Total Protein, Urine: NEGATIVE
URINE GLUCOSE: NEGATIVE
Urobilinogen, UA: 0.2 (ref 0.0–1.0)
pH: 7 (ref 5.0–8.0)

## 2017-04-17 LAB — BASIC METABOLIC PANEL
BUN: 24 mg/dL — AB (ref 6–23)
CHLORIDE: 102 meq/L (ref 96–112)
CO2: 28 meq/L (ref 19–32)
Calcium: 9.8 mg/dL (ref 8.4–10.5)
Creatinine, Ser: 1.28 mg/dL — ABNORMAL HIGH (ref 0.40–1.20)
GFR: 44.15 mL/min — ABNORMAL LOW (ref >60.00)
GLUCOSE: 112 mg/dL — AB (ref 70–99)
POTASSIUM: 4.4 meq/L (ref 3.5–5.1)
Sodium: 138 mEq/L (ref 135–145)

## 2017-04-17 LAB — HEMOGLOBIN A1C: Hgb A1c MFr Bld: 6.1 % (ref 4.6–6.5)

## 2017-04-17 MED ORDER — ALPRAZOLAM 0.5 MG PO TABS: 0.5000 mg | ORAL_TABLET | Freq: Every day | ORAL | 0 refills | Status: DC | PRN

## 2017-04-17 NOTE — Progress Notes (Addendum)
Patient ID: Victoria Hanson, female   DOB: 1950-08-07, 66 y.o.   MRN: [id number]   Subjective:    Patient ID: Victoria Hanson, female    DOB: [birth year], 66 y.o.   MRN: [id number]  HPI  Patient with past history of hypercholesterolemia, hypertension, colon cancer and lung cancer.  Here today to follow up on these isseus.   She reports she is doing relatively well.  Saw GI 03/05/17.  Diagnosed with acute anal fissure.  Treated.  Better.  Note reviewed.  Saw Dr Mike Gip 03/08/17.  Stable.  Recommended f/u in 6 months.  (chest CT - no evidence of malignancy).  Breathing stable.  No chest pain.  No acid reflux.  No abdominal pain.  Seeing neurology.  Has been diagnosed with Parkinsons.  On sinemet.  Overall stable.  She reports noticing her urine is "warmer".  No dysuria.  Wants urine checked to confirm no infection.      Past Medical History:  Diagnosis Date  . Anxiety   . Atypical chest pain    a. 2003 Cath: reportedly nl per pt [name]);  b. 04/2015 [place] Admission, neg troponin.  . Colon cancer ([place])    a. 1999: Pt had partial colectomy. Did develop lung metastasis. Had right upper lobectomy in 01/2007 then had associated chemotherapy.  Marland Kitchen COPD (chronic obstructive pulmonary disease) ([place])   . DDD (degenerative disc disease), lumbosacral   . Depression   . Echocardiogram abnormal    a. 02/2010 Echo: EF 55-60%, mild LAE, no regional wall motion abnormalities  . Elevated transaminase level    a. ? NASH;  b. 06/2015 - nl LFTs.  . GERD (gastroesophageal reflux disease)   . Hematuria   . Holter monitor, abnormal    a. 12/2009 Holter monitoring: NSR, rare PACs and PVCs.  Marland Kitchen Hypercholesterolemia   . Hyperlipidemia   . Hypertension   . Leaky heart valve    a. Per pt report - no evidence of valvular abnormalities on prior echoes.  . Lung cancer ([place])    a. S/P resection (lobectomy - right)  . Morbid obesity ([place])   . Nephrolithiasis   . Parkinson disease ([place]) 08/2016   [name]   Past Surgical  History:  Procedure Laterality Date  . BREAST EXCISIONAL BIOPSY Right 10/07/2009   benign  . CARDIAC CATHETERIZATION  2003   negative as per pt report  . CHOLECYSTECTOMY  2000  . COLON SURGERY     Colon cancer, removed part of large colon  . COLONOSCOPY N/A 04/25/2015   Procedure: COLONOSCOPY;  Surgeon: Manya Silvas, MD;  Location: [facility] ENDOSCOPY;  Service: Endoscopy;  Laterality: N/A;  . LOBECTOMY     Right lung, upper lobe right side removed  . TOTAL ABDOMINAL HYSTERECTOMY     abnormal bleeding  . TUBAL LIGATION  1980   Family History  Problem Relation Age of Onset  . Coronary artery disease Mother        died @ 68 of MI.  . Mental illness Mother   . Diabetes Mother   . Heart attack Father 39       MI  . Cancer Father        lung - died in his mid-70's  . Hypertension Father   . Diabetes Father   . Sudden death Brother   . Mental illness Brother   . Heart attack Brother        MI @ 84, died @ age 73.  . Breast cancer Neg  Hx    Social History   Socioeconomic History  . Marital status: Widowed    Spouse name: None  . Number of children: None  . Years of education: None  . Highest education level: None  Social Needs  . Financial resource strain: None  . Food insecurity - worry: None  . Food insecurity - inability: None  . Transportation needs - medical: None  . Transportation needs - non-medical: None  Occupational History  . None  Tobacco Use  . Smoking status: Passive Smoke Exposure - Never Smoker  . Smokeless tobacco: Never Used  . Tobacco comment: husband and sons smoked in her home.  Substance and Sexual Activity  . Alcohol use: Yes    Alcohol/week: 0.6 oz    Types: 1 Glasses of wine per week    Comment: rare wine cooler.  . Drug use: No  . Sexual activity: None  Other Topics Concern  . None  Social History Narrative   Widowed.   Disabled (colon/lung CA), retired.   Lives in [place] with her dtr, grand-dtr, and son.   Active.   Gets  regular exercise, walks.    Outpatient Encounter Medications as of 04/17/2017  Medication Sig  . albuterol (PROVENTIL HFA;VENTOLIN HFA) 108 (90 Base) MCG/ACT inhaler Inhale 2 puffs into the lungs every 6 (six) hours as needed for wheezing.  Marland Kitchen ALPRAZolam (XANAX) 0.5 MG tablet Take 1 tablet (0.5 mg total) daily as needed by mouth for anxiety.  Marland Kitchen aspirin EC 81 MG tablet Take 81 mg by mouth daily.  . carbidopa-levodopa (SINEMET IR) 25-250 MG tablet Take 1 tablet by mouth 3 (three) times daily.  . fluticasone-salmeterol (ADVAIR HFA) 115-21 MCG/ACT inhaler Inhale 2 puffs into the lungs 2 (two) times daily.  . Multiple Vitamin (MULTIVITAMIN) tablet Take 1 tablet by mouth daily.  Marland Kitchen omeprazole (PRILOSEC) 20 MG capsule TAKE 1 CAPSULE BY MOUTH EVERY DAY  . potassium chloride SA (K-DUR,KLOR-CON) 20 MEQ tablet TAKE ONE TABLET BY MOUTH TWICE A DAY  . traZODone (DESYREL) 50 MG tablet TAKE ONE AND ONE-HALF TABLET BY MOUTH ATBEDTIME  . triamterene-hydrochlorothiazide (MAXZIDE-25) 37.5-25 MG tablet TAKE 1 TABLET BY MOUTH EVERY DAY  . triamterene-hydrochlorothiazide (MAXZIDE-25) 37.5-25 MG tablet TAKE 1 TABLET BY MOUTH EVERY DAY  . [DISCONTINUED] ALPRAZolam (XANAX) 0.5 MG tablet Take 1 tablet (0.5 mg total) by mouth daily as needed for anxiety.  . [DISCONTINUED] buPROPion (WELLBUTRIN) 100 MG tablet    No facility-administered encounter medications on file as of 04/17/2017.     Review of Systems  Constitutional: Negative for appetite change and unexpected weight change.  HENT: Negative for congestion and sinus pressure.   Eyes: Negative for pain and visual disturbance.  Respiratory: Negative for cough, chest tightness and shortness of breath.   Cardiovascular: Negative for chest pain, palpitations and leg swelling.  Gastrointestinal: Negative for abdominal pain, diarrhea, nausea and vomiting.  Genitourinary: Negative for difficulty urinating and dysuria.  Musculoskeletal: Negative for back pain and joint  swelling.  Skin: Negative for color change and rash.  Neurological: Negative for dizziness and headaches.  Hematological: Negative for adenopathy. Does not bruise/bleed easily.  Psychiatric/Behavioral: Negative for agitation and dysphoric mood.       Objective:    Physical Exam  Constitutional: She is oriented to person, place, and time. She appears well-developed and well-nourished. No distress.  HENT:  Nose: Nose normal.  Mouth/Throat: Oropharynx is clear and moist.  Eyes: Right eye exhibits no discharge. Left eye exhibits no discharge. No  scleral icterus.  Neck: Neck supple. No thyromegaly present.  Cardiovascular: Normal rate and regular rhythm.  Pulmonary/Chest: Breath sounds normal. No accessory muscle usage. No tachypnea. No respiratory distress. She has no decreased breath sounds. She has no wheezes. She has no rhonchi. Right breast exhibits no inverted nipple, no mass, no nipple discharge and no tenderness (no axillary adenopathy). Left breast exhibits no inverted nipple, no mass, no nipple discharge and no tenderness (no axilarry adenopathy).  Abdominal: Soft. Bowel sounds are normal. There is no tenderness.  Musculoskeletal: She exhibits no edema or tenderness.  Lymphadenopathy:    She has no cervical adenopathy.  Neurological: She is alert and oriented to person, place, and time.  Skin: Skin is warm. No rash noted. No erythema.  Psychiatric: She has a normal mood and affect. Her behavior is normal.    BP 128/80 (BP Location: Left Arm, Patient Position: Sitting, Cuff Size: Large)   Pulse 78   Temp 97.7 F (36.5 C) (Oral)   Resp 14   Ht _0  (1.626 m)   Wt 193 lb 9.6 oz (87.8 kg)   SpO2 96%   BMI 33.23 kg/m  Wt Readings from Last 3 Encounters:  04/17/17 193 lb 9.6 oz (87.8 kg)  03/08/17 192 lb 4 oz (87.2 kg)  02/28/17 190 lb 4.8 oz (86.3 kg)     Lab Results  Component Value Date   WBC 9.2 02/28/2017   HGB 12.0 02/28/2017   HCT 34.3 (L) 02/28/2017   PLT 266  02/28/2017   GLUCOSE 112 (H) 04/17/2017   CHOL 237 (H) 04/17/2017   TRIG 140.0 04/17/2017   HDL 60.90 04/17/2017   LDLDIRECT 163.4 12/31/2012   LDLCALC 148 (H) 04/17/2017   ALT 5 04/17/2017   AST 17 04/17/2017   NA 138 04/17/2017   K 4.4 04/17/2017   CL 102 04/17/2017   CREATININE 1.28 (H) 04/17/2017   BUN 24 (H) 04/17/2017   CO2 28 04/17/2017   TSH 1.43 01/14/2017   INR 1.0 12/12/2012   HGBA1C 6.1 04/17/2017    Ct Chest Wo Contrast  Result Date: 03/06/2017 CLINICAL DATA:  Pt c/o worsening SOB on exertion x 1 year. NKI Hx Colon CA with mets to Lung with Right upper Lobe resection in 01/2007. Never a smoker. EXAM: CT CHEST WITHOUT CONTRAST TECHNIQUE: Multidetector CT imaging of the chest was performed following the standard protocol without IV contrast. COMPARISON:  Chest radiograph, 09/24/2016.  Chest CT, 03/10/2016. FINDINGS: Cardiovascular: Heart is mildly enlarged. No coronary artery calcifications. Great vessels normal in caliber. Mediastinum/Nodes: No neck base or axillary masses or adenopathy. No mediastinal or hilar masses or adenopathy. Trachea widely patent. Esophagus is unremarkable. Lungs/Pleura: Postsurgical changes from a right upper lobectomy with anastomosis staples extending from right hilum along the medial margin right middle lobe. Mild linear scarring versus atelectasis at the lateral right lung base. Mild subsegmental atelectasis at the base the left lower lobe. No evidence of pneumonia. No pulmonary edema. No lung mass or suspicious nodule. No pleural effusion or pneumothorax. Upper Abdomen: No acute abnormality. Musculoskeletal: No fracture or acute finding. No osteoblastic or osteolytic lesions. IMPRESSION: 1. No acute findings.  No evidence of pneumonia or pulmonary edema. 2. Postsurgical changes on the right from a right upper lobectomy. 3. Mild areas of lung scarring in left lower lobe dependent subsegmental atelectasis. 4. No evidence of malignancy. Electronically  Signed   By: Lajean Manes M.D.   On: 03/06/2017 19:35       Assessment &  Plan:   Problem List Items Addressed This Visit    Anxiety, mild    Takes xanax prn.        Relevant Medications   ALPRAZolam (XANAX) 0.5 MG tablet   BMI 33.0-33.9,adult    Discussed diet and exercise.  Follow.        Carotid stenosis    Saw [facility] - carotid ultrasound - <30% bilateral.  Recommended f/u prn.        CKD (chronic kidney disease), stage III (HCC)    Previous renal ultrasound - no significant abnormalities.  Check metabolic panel.        Depression, major, recurrent ([place])    Stable.  Doing better.        Relevant Medications   ALPRAZolam (XANAX) 0.5 MG tablet   GERD (gastroesophageal reflux disease)    Controlled on omeprazole.        Health care maintenance    Had physical 01/2017.  Mammogram 01/28/17 - Birads I.  Colonoscopy 04/15/15 - internal hemorrhoids.        Hypercholesterolemia    Low cholesterol diet and exercise.  Follow lipid panel.        Relevant Orders   Hepatic function panel (Completed)   Lipid panel (Completed)   Hyperglycemia    Low carb diet and exercise.  Follow met b and a1c.        Relevant Orders   Hemoglobin A1c (Completed)   Hypertension    Blood pressure under good control.  Continue same medication regimen.  Follow pressures.  Follow metabolic panel.        Relevant Orders   Basic metabolic panel (Completed)   Urinalysis, Routine w reflex microscopic (Completed)   Lung metastasis ([place])    Followed by Dr Mike Gip.        Relevant Medications   ALPRAZolam (XANAX) 0.5 MG tablet   Malignant neoplasm of colon ([place])    Colonoscopy 04/25/15.  Followed by GI and oncology.  Evaluated by Dr Mike Gip recently.  Stable.        Relevant Medications   ALPRAZolam (XANAX) 0.5 MG tablet   Pure hypercholesterolemia    Low cholesterol diet and exercise.  Follow lipid panel.         Other Visit Diagnoses    Routine general medical examination at a  health care facility    -  Primary   Encounter for immunization       Relevant Orders   Flu vaccine HIGH DOSE PF (Completed)       Einar Pheasant, MD

## 2017-04-19 ENCOUNTER — Ambulatory Visit: Payer: Self-pay | Admitting: *Deleted

## 2017-04-19 NOTE — Telephone Encounter (Signed)
Pt given results of U/A and blood work.  Urine is ok.  Overall sugar control is stable.   Continue low carb diet and exercise.. Will follow.  Cholesterol has increased some when compared to the previous check.   I recommend a low cholesterol diet and exercise.   Will follow.  Will discuss other treatment with her more at her next appt ( and will follow cholesterol).   Kidney function tests overall stable.  Liver function tests are WNL.

## 2017-04-20 ENCOUNTER — Encounter: Payer: Self-pay | Admitting: Internal Medicine

## 2017-04-20 NOTE — Assessment & Plan Note (Signed)
Blood pressure under good control.  Continue same medication regimen.  Follow pressures.  Follow metabolic panel.   

## 2017-04-20 NOTE — Assessment & Plan Note (Addendum)
Had physical 01/2017.  Mammogram 01/28/17 - Birads I.  Colonoscopy 04/15/15 - internal hemorrhoids.

## 2017-04-20 NOTE — Assessment & Plan Note (Signed)
Colonoscopy 04/25/15.  Followed by GI and oncology.  Evaluated by Dr Mike Gip recently.  Stable.

## 2017-04-20 NOTE — Assessment & Plan Note (Signed)
Previous renal ultrasound - no significant abnormalities.  Check metabolic panel.

## 2017-04-20 NOTE — Assessment & Plan Note (Signed)
Stable.  Doing better.

## 2017-04-20 NOTE — Assessment & Plan Note (Signed)
Saw [facility] - carotid ultrasound - <30% bilateral.  Recommended f/u prn.

## 2017-04-20 NOTE — Assessment & Plan Note (Signed)
Low carb diet and exercise.  Follow met b and a1c.   

## 2017-04-20 NOTE — Assessment & Plan Note (Signed)
Low cholesterol diet and exercise.  Follow lipid panel.   

## 2017-04-20 NOTE — Assessment & Plan Note (Signed)
Takes xanax prn.

## 2017-04-20 NOTE — Assessment & Plan Note (Signed)
Followed by Dr Mike Gip.

## 2017-04-20 NOTE — Assessment & Plan Note (Signed)
Controlled on omeprazole.   

## 2017-04-20 NOTE — Assessment & Plan Note (Signed)
Discussed diet and exercise.  Follow.  

## 2017-04-23 NOTE — Progress Notes (Signed)
Results given.  See triage documentation

## 2017-04-24 ENCOUNTER — Telehealth: Payer: Self-pay

## 2017-04-24 NOTE — Telephone Encounter (Signed)
Please advise   Copied from [place] [id number]. Topic: General - Other >> Apr 24, 2017  2:20 PM Ivar Drape wrote: Reason for CRM: Patient says she received a telephone call from Dr. Lars Mage CNA and she was returning the telephone call

## 2017-04-25 NOTE — Telephone Encounter (Signed)
See lab results.  

## 2017-04-26 IMAGING — MR MR HEAD W/O CM
10 series · 41 of 48 positions shown · non-contrast
Comparison: CT head 08/01/2015

CLINICAL DATA: Headache syndrome.  Dizziness

EXAM:
MRI HEAD WITHOUT CONTRAST
TECHNIQUE: Multiplanar, multiecho pulse sequences of the brain and surrounding
structures were obtained without intravenous contrast.

[Series 2: T1 · sagittal · 5.0mm · 0.45mm/px · 3 of 29 slices shown (1 of 2)]
[im 1/29]
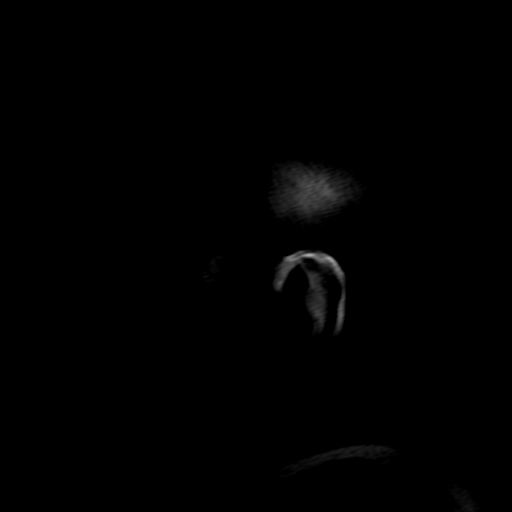
[im 15/29]
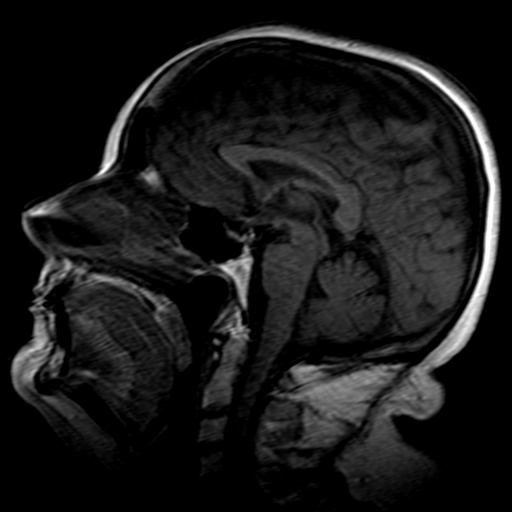
[im 29/29]
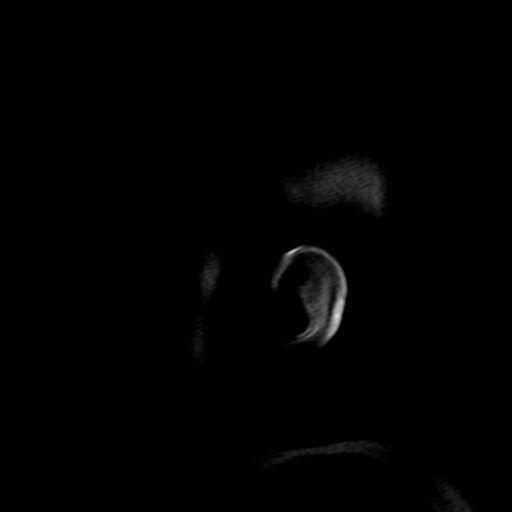

[Series 4: DWI · axial · 4.0mm · 0.94mm/px · z∈[-11,+142]mm · 5 of 45 slices shown (1 of 4)]
[im 1/45]
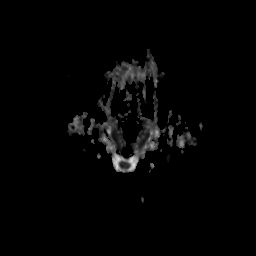
[im 12/45]
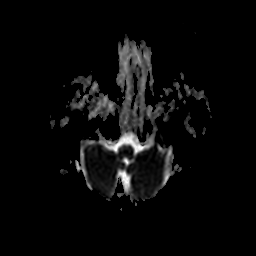
[im 23/45]
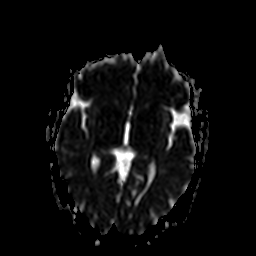
[im 34/45]
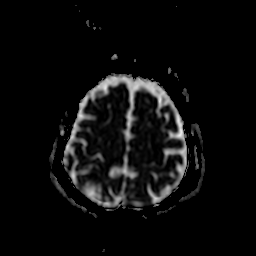
[im 45/45]
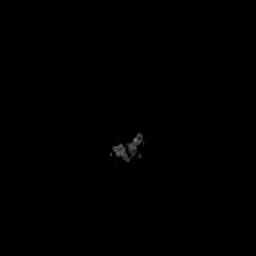

[Series 5: DWI · axial · 4.0mm · 0.94mm/px · z∈[-11,+142]mm · 6 of 44 slices shown (2 of 4)]
[im 1/44]
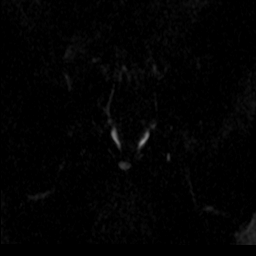
[im 9/44]
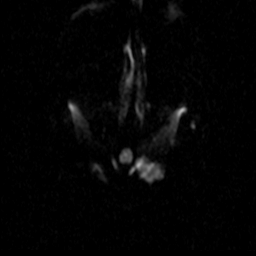
[im 18/44]
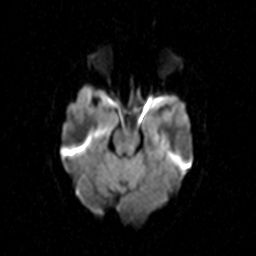
[im 26/44]
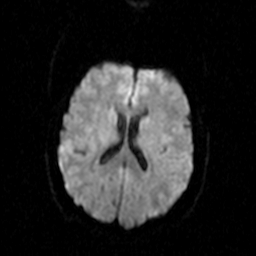
[im 35/44]
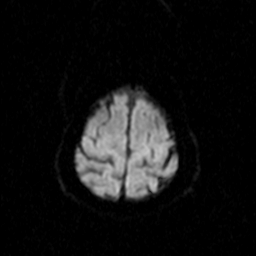
[im 44/44]
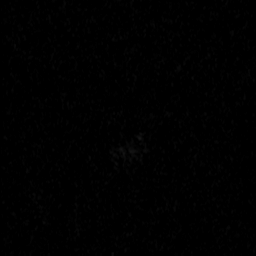

[Series 7: DWI · coronal · 5.0mm · 1.80mm/px · 5 of 39 slices shown (3 of 4)]
[im 1/39]
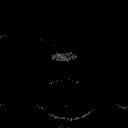
[im 10/39]
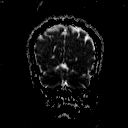
[im 20/39]
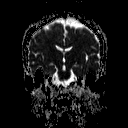
[im 29/39]
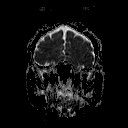
[im 39/39]
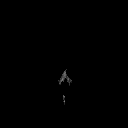

[Series 8: DWI · coronal · 5.0mm · 1.80mm/px · 5 of 37 slices shown (4 of 4)]
[im 1/37]
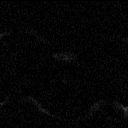
[im 10/37]
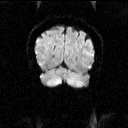
[im 19/37]
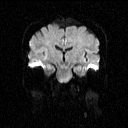
[im 28/37]
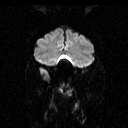
[im 37/37]
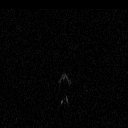

[Series 9: T2 · axial · 5.0mm · 0.45mm/px · z∈[-10,+137]mm · 4 of 27 slices shown (1 of 3)]
[im 1/27]
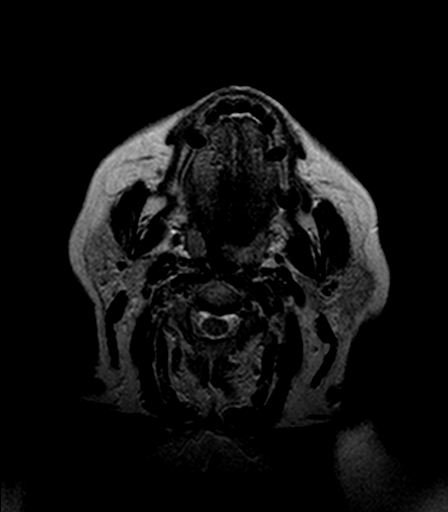
[im 9/27]
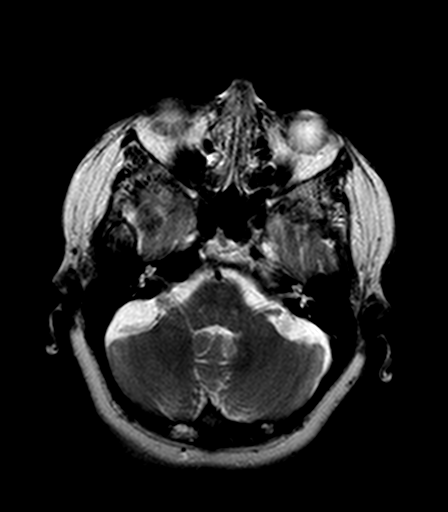
[im 18/27]
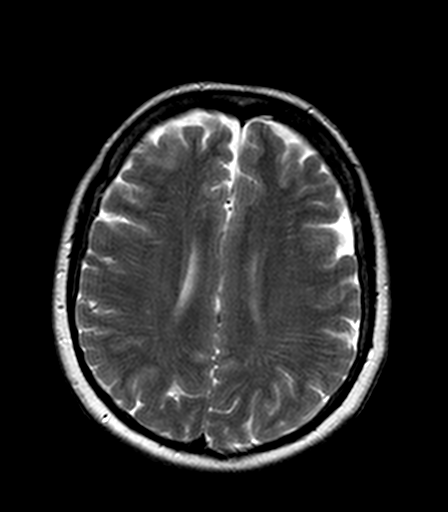
[im 27/27]
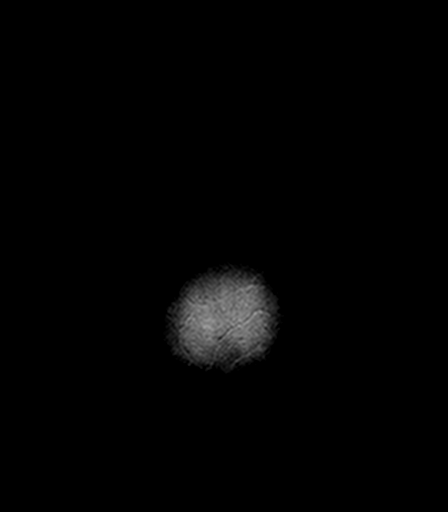

[Series 10: FLAIR · axial · 5.0mm · 0.90mm/px · z∈[-10,+138]mm · 4 of 27 slices shown]
[im 1/27]
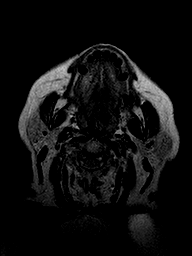
[im 9/27]
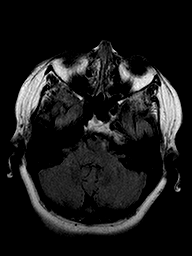
[im 18/27]
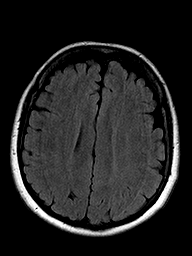
[im 27/27]
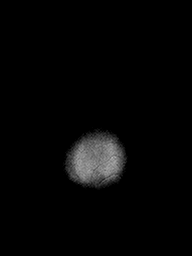

[Series 11: T2 · axial · 5.0mm · 0.45mm/px · z∈[-10,+137]mm · 4 of 27 slices shown (2 of 3)]
[im 1/27]
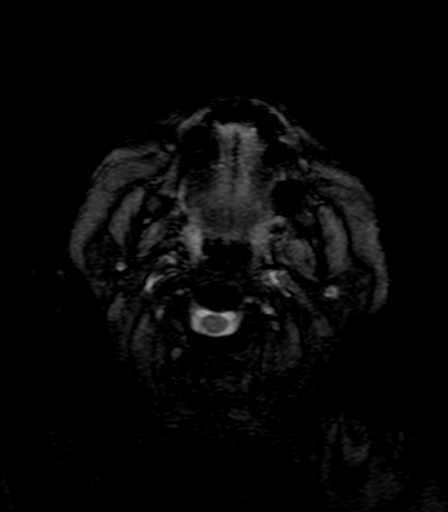
[im 9/27]
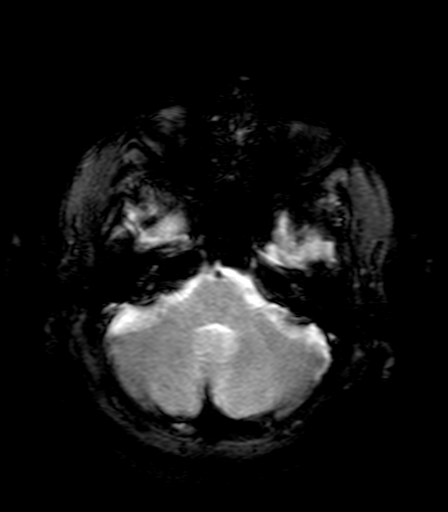
[im 18/27]
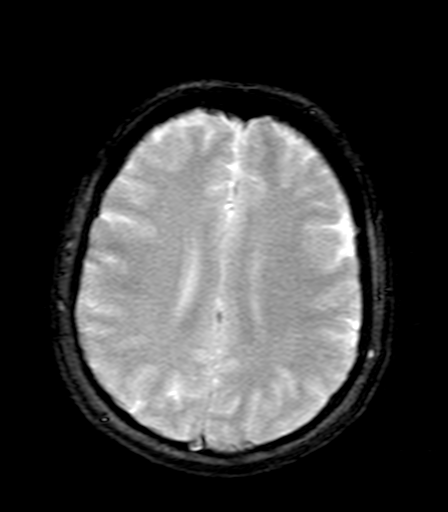
[im 27/27]
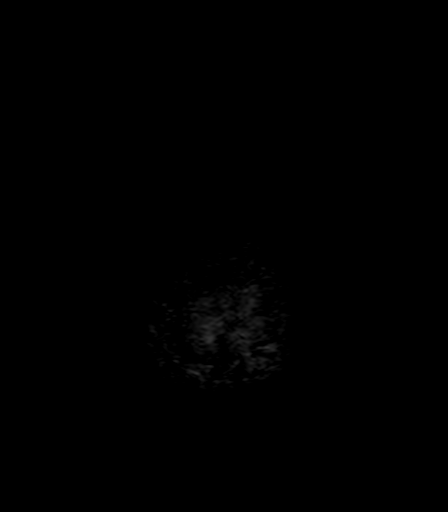

[Series 12: T1 · axial · 3.0mm · 0.45mm/px · 1 of 60 slices shown (2 of 2)]
[im 1/60]
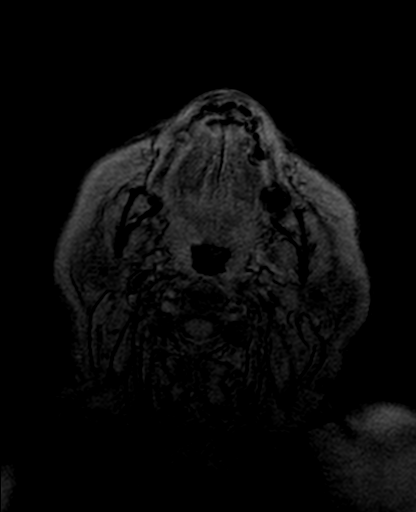

[Series 13: T2 · coronal · 5.0mm · 0.45mm/px · 4 of 31 slices shown (3 of 3)]
[im 1/31]
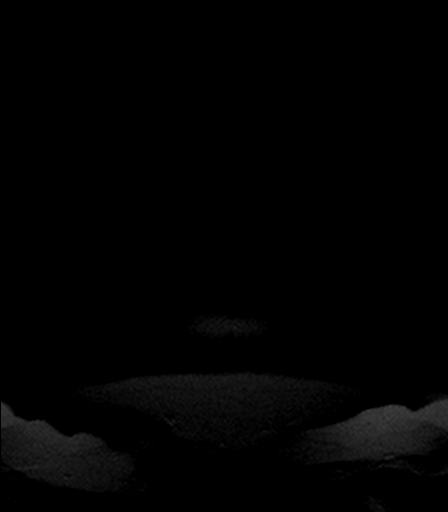
[im 11/31]
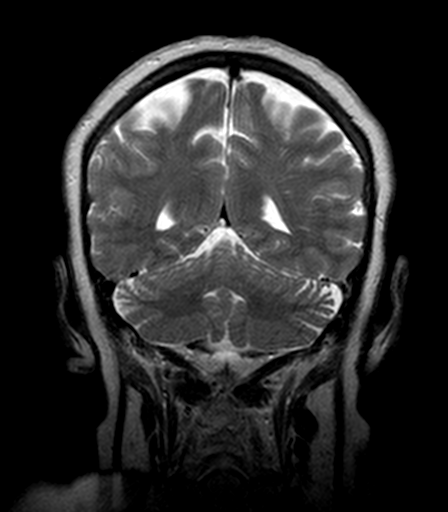
[im 21/31]
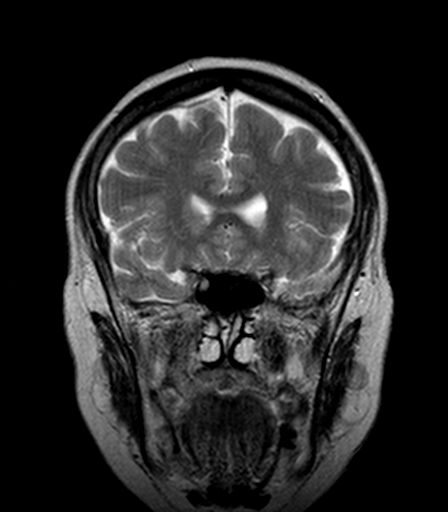
[im 31/31]
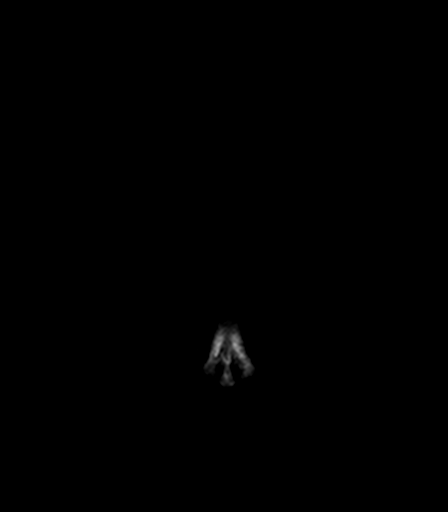

[41 of 48 positions shown; findings below may reference images not displayed]

FINDINGS: Ventricle size normal.  Cerebral volume normal for age.

Negative for acute or chronic infarction

Negative for demyelinating disease. Cerebral white matter normal.
Brainstem and basal ganglia normal.

Negative for intracranial hemorrhage. Negative for mass or edema. No
shift of the midline structures.

Pituitary normal in size. Normal skullbase. Normal orbit and
paranasal sinuses.
IMPRESSION: Normal unenhanced MRI of the head.

## 2017-04-29 ENCOUNTER — Other Ambulatory Visit: Payer: Self-pay | Admitting: Internal Medicine

## 2017-05-06 ENCOUNTER — Ambulatory Visit: Payer: Medicare HMO

## 2017-05-09 ENCOUNTER — Ambulatory Visit: Payer: Medicare HMO

## 2017-05-13 ENCOUNTER — Other Ambulatory Visit: Payer: Self-pay | Admitting: Internal Medicine

## 2017-05-22 DIAGNOSIS — Z8659 Personal history of other mental and behavioral disorders: Secondary | ICD-10-CM | POA: Diagnosis not present

## 2017-05-22 DIAGNOSIS — G4752 REM sleep behavior disorder: Secondary | ICD-10-CM | POA: Diagnosis not present

## 2017-05-22 DIAGNOSIS — G479 Sleep disorder, unspecified: Secondary | ICD-10-CM | POA: Diagnosis not present

## 2017-05-22 DIAGNOSIS — G20 Parkinson's disease: Secondary | ICD-10-CM | POA: Diagnosis not present

## 2017-05-28 DIAGNOSIS — G4752 REM sleep behavior disorder: Secondary | ICD-10-CM | POA: Insufficient documentation

## 2017-05-29 ENCOUNTER — Ambulatory Visit (INDEPENDENT_AMBULATORY_CARE_PROVIDER_SITE_OTHER): Payer: Medicare HMO

## 2017-05-29 VITALS — BP 126/78 | HR 73 | Temp 98.0°F | Resp 14 | Ht 64.0 in | Wt 193.8 lb

## 2017-05-29 DIAGNOSIS — Z0000 Encounter for general adult medical examination without abnormal findings: Secondary | ICD-10-CM

## 2017-05-29 NOTE — Progress Notes (Signed)
Subjective:   Victoria Hanson is a 66 y.o. female who presents for Medicare Annual (Subsequent) preventive examination.  Review of Systems:  No ROS.  Medicare Wellness Visit. Additional risk factors are reflected in the social history.  Cardiac Risk Factors include: advanced age (>38men, >48 women);hypertension;obesity (BMI >30kg/m2)     Objective:     Vitals: BP 126/78 (BP Location: Left Arm, Patient Position: Sitting, Cuff Size: Normal)   Pulse 73   Temp 98 F (36.7 C) (Oral)   Resp 14   Ht 5\' 4"  (1.626 m)   Wt 193 lb 12.8 oz (87.9 kg)   SpO2 99%   BMI 33.27 kg/m   Body mass index is 33.27 kg/m.  Advanced Directives 05/29/2017 03/08/2017 02/28/2017 09/24/2016 08/23/2016 07/19/2016 03/10/2016  Does Patient Have a Medical Advance Directive? Yes No No No No No Yes  Type of Advance Directive [place]  Does patient want to make changes to medical advance directive? No - Patient declined - - - - - -  Copy of [place] in Chart? Yes - - - - - -  Would patient like information on creating a medical advance directive? - - No - Patient declined No - Patient declined No - Patient declined - -    Tobacco Social History   Tobacco Use  Smoking Status Passive Smoke Exposure - Never Smoker  Smokeless Tobacco Never Used  Tobacco Comment   husband and sons smoked in her home.     Counseling given: Not Answered Comment: husband and sons smoked in her home.   Clinical Intake:  Pre-visit preparation completed: Yes  Pain : No/denies pain     Nutritional Status: BMI > 30  Obese Diabetes: No  How often do you need to have someone help you when you read instructions, pamphlets, or other written materials from your doctor or pharmacy?: 1 - Never  Interpreter Needed?: No     Past Medical History:  Diagnosis Date  . Anxiety   . Atypical chest pain    a. 2003 Cath: reportedly nl per pt [name]);  b. 04/2015 [place] Admission, neg  troponin.  . Colon cancer ([place])    a. 1999: Pt had partial colectomy. Did develop lung metastasis. Had right upper lobectomy in 01/2007 then had associated chemotherapy.  Marland Kitchen COPD (chronic obstructive pulmonary disease) ([place])   . DDD (degenerative disc disease), lumbosacral   . Depression   . Echocardiogram abnormal    a. 02/2010 Echo: EF 55-60%, mild LAE, no regional wall motion abnormalities  . Elevated transaminase level    a. ? NASH;  b. 06/2015 - nl LFTs.  . GERD (gastroesophageal reflux disease)   . Hematuria   . Holter monitor, abnormal    a. 12/2009 Holter monitoring: NSR, rare PACs and PVCs.  Marland Kitchen Hypercholesterolemia   . Hyperlipidemia   . Hypertension   . Leaky heart valve    a. Per pt report - no evidence of valvular abnormalities on prior echoes.  . Lung cancer ([place])    a. S/P resection (lobectomy - right)  . Morbid obesity ([place])   . Nephrolithiasis   . Parkinson disease ([place]) 08/2016   [name]   Past Surgical History:  Procedure Laterality Date  . BREAST EXCISIONAL BIOPSY Right 10/07/2009   benign  . CARDIAC CATHETERIZATION  2003   negative as per pt report  . CHOLECYSTECTOMY  2000  . COLON SURGERY  Colon cancer, removed part of large colon  . COLONOSCOPY N/A 04/25/2015   Procedure: COLONOSCOPY;  Surgeon: Manya Silvas, MD;  Location: [facility] ENDOSCOPY;  Service: Endoscopy;  Laterality: N/A;  . LOBECTOMY     Right lung, upper lobe right side removed  . TOTAL ABDOMINAL HYSTERECTOMY     abnormal bleeding  . TUBAL LIGATION  1980   Family History  Problem Relation Age of Onset  . Coronary artery disease Mother        died @ 30 of MI.  . Mental illness Mother   . Diabetes Mother   . Heart attack Father 44       MI  . Cancer Father        lung - died in his mid-70's  . Hypertension Father   . Diabetes Father   . Sudden death Brother   . Mental illness Brother   . Heart attack Brother        MI @ 42, died @ age 14.  . Breast cancer Neg Hx    Social  History   Socioeconomic History  . Marital status: Widowed    Spouse name: None  . Number of children: None  . Years of education: None  . Highest education level: None  Social Needs  . Financial resource strain: None  . Food insecurity - worry: None  . Food insecurity - inability: None  . Transportation needs - medical: None  . Transportation needs - non-medical: None  Occupational History  . None  Tobacco Use  . Smoking status: Passive Smoke Exposure - Never Smoker  . Smokeless tobacco: Never Used  . Tobacco comment: husband and sons smoked in her home.  Substance and Sexual Activity  . Alcohol use: Yes    Alcohol/week: 0.6 oz    Types: 1 Glasses of wine per week    Comment: rare wine cooler.  . Drug use: No  . Sexual activity: No  Other Topics Concern  . None  Social History Narrative   Widowed.   Disabled (colon/lung CA), retired.   Lives in [place] with her dtr, grand-dtr, and son.   Active.   Gets regular exercise, walks.    Outpatient Encounter Medications as of 05/29/2017  Medication Sig  . ALPRAZolam (XANAX) 0.5 MG tablet Take 1 tablet (0.5 mg total) daily as needed by mouth for anxiety.  Marland Kitchen aspirin EC 81 MG tablet Take 81 mg by mouth daily.  . carbidopa-levodopa (SINEMET IR) 25-250 MG tablet Take 1 tablet by mouth 3 (three) times daily.  . fluticasone-salmeterol (ADVAIR HFA) 115-21 MCG/ACT inhaler Inhale 2 puffs into the lungs 2 (two) times daily.  . Multiple Vitamin (MULTIVITAMIN) tablet Take 1 tablet by mouth daily.  Marland Kitchen omeprazole (PRILOSEC) 20 MG capsule TAKE 1 CAPSULE BY MOUTH EVERY DAY  . potassium chloride SA (K-DUR,KLOR-CON) 20 MEQ tablet TAKE ONE TABLET BY MOUTH TWICE A DAY  . traZODone (DESYREL) 50 MG tablet TAKE ONE AND ONE-HALF TABLET BY MOUTH ATBEDTIME  . triamterene-hydrochlorothiazide (MAXZIDE-25) 37.5-25 MG tablet TAKE 1 TABLET BY MOUTH EVERY DAY  . triamterene-hydrochlorothiazide (MAXZIDE-25) 37.5-25 MG tablet TAKE 1 TABLET BY MOUTH EVERY  DAY  . VENTOLIN HFA 108 (90 Base) MCG/ACT inhaler INHALE 2 PUFFS BY MOUTH INTO THE LUNGS EVERY 6 HOURS AS NEEDED FOR WHEEZING   No facility-administered encounter medications on file as of 05/29/2017.     Activities of Daily Living In your present state of health, do you have any difficulty performing the following activities:  05/29/2017  Hearing? N  Vision? N  Difficulty concentrating or making decisions? N  Walking or climbing stairs? N  Dressing or bathing? N  Doing errands, shopping? N  Preparing Food and eating ? N  Using the Toilet? N  In the past six months, have you accidently leaked urine? N  Do you have problems with loss of bowel control? N  Managing your Medications? N  Managing your Finances? N  Housekeeping or managing your Housekeeping? N  Some recent data might be hidden    Patient Care Team: Einar Pheasant, MD as PCP - General (Unknown Physician Specialty) Manya Silvas, MD (Gastroenterology)    Assessment:   This is a routine wellness examination for Teyona.  The goal of the wellness visit is to assist the patient how to close the gaps in care and create a preventative care plan for the patient.   The roster of all physicians providing medical care to patient is listed in the Snapshot section of the chart.  Taking calcium VIT D as appropriate/Osteoporosis reviewed.    Safety issues reviewed; Smoke and carbon monoxide detectors in the home. No firearms in the home.  Wears seatbelts when driving or riding with others. Patient does wear sunscreen or protective clothing when in direct sunlight. No violence in the home.  Patient is alert, normal appearance, oriented to person/place/and time. Correctly identified the president of the [place], recall of 3/3 words, and performing simple calculations. Displays appropriate judgement and can read correct time from watch face.   No new identified risk were noted.  No failures at ADL's or IADL's.   BMI- discussed  the importance of a healthy diet, water intake and the benefits of aerobic exercise. Educational material provided.   24 hour diet recall: Breakfast: egg with cheese Lunch: ravioli with cheese Dinner: tomato soup Snack: raisin cookie Daily fluid intake: 4 cups of caffeine, 2 cups of water  Eye- Visual acuity not assessed per patient preference since they have regular follow up with the ophthalmologist.  Wears corrective lenses.  Sleep patterns- Sleeps 5 hours at night.    TDAP vaccine deferred per patient preference.  Follow up with insurance.  Educational material provided.  Patient Concerns: None at this time. Follow up with PCP as needed.  Exercise Activities and Dietary recommendations Current Exercise Habits: Home exercise routine, Type of exercise: walking, Time (Minutes): 20, Frequency (Times/Week): 4, Weekly Exercise (Minutes/Week): 80, Intensity: Mild  Goals    .  INCREASE WATER INTAKE     Stay hydrated Trade 1 cup of caffeine for 1 cup of water Finish entire cup of water when taking medications     . INCREASE LEAN PROTEINS     Low carb foods Low cholesterol foods       Fall Risk Fall Risk  05/29/2017 06/01/2016 10/11/2015 07/21/2015 11/12/2014  Falls in the past year? No No No No No     Depression Screen PHQ 2/9 Scores 05/29/2017 06/01/2016 10/11/2015 08/31/2015  PHQ - 2 Score 0 0 2 0  PHQ- 9 Score - - 6 -     Cognitive Function     6CIT Screen 05/29/2017  What Year? 0 points  What month? 0 points  What time? 0 points  Count back from 20 0 points  Months in reverse 0 points  Repeat phrase 0 points  Total Score 0    Immunization History  Administered Date(s) Administered  . Influenza, High Dose Seasonal PF 04/17/2017  . Influenza,inj,Quad PF,6+ Mos 04/02/2013,  04/07/2014, 03/15/2015, 02/03/2016  . Pneumococcal Conjugate-13 01/11/2015  . Pneumococcal Polysaccharide-23 01/14/2017    Screening Tests Health Maintenance  Topic Date Due  .  TETANUS/TDAP  11/17/1968  . MAMMOGRAM  01/28/2018  . COLONOSCOPY  04/24/2018  . INFLUENZA VACCINE  Completed  . DEXA SCAN  Completed  . Hepatitis C Screening  Completed  . PNA vac Low Risk Adult  Completed      Plan:    End of life planning; Advance aging; Advanced directives discussed. Copy of current HCPOA/Living Will on file.    I have personally reviewed and noted the following in the patient's chart:   . Medical and social history . Use of alcohol, tobacco or illicit drugs  . Current medications and supplements . Functional ability and status . Nutritional status . Physical activity . Advanced directives . List of other physicians . Hospitalizations, surgeries, and ER visits in previous 12 months . Vitals . Screenings to include cognitive, depression, and falls . Referrals and appointments  In addition, I have reviewed and discussed with patient certain preventive protocols, quality metrics, and best practice recommendations. A written personalized care plan for preventive services as well as general preventive health recommendations were provided to patient.     Varney Biles, LPN  16/03/9603   Reviewed above information.  Agree with assessment and plan.   Dr Nicki Reaper

## 2017-05-29 NOTE — Patient Instructions (Addendum)
  Victoria Hanson , Thank you for taking time to come for your Medicare Wellness Visit. I appreciate your ongoing commitment to your health goals. Please review the following plan we discussed and let me know if I can assist you in the future.   Follow up with Dr. Nicki Reaper as needed.    Have a great day and Merry Christmas!  These are the goals we discussed: Goals    .  INCREASE WATER INTAKE     Stay hydrated Trade 1 cup of caffeine for 1 cup of water Finish entire cup of water when taking medications     . INCREASE LEAN PROTEINS     Low carb foods Low cholesterol foods       This is a list of the screening recommended for you and due dates:  Health Maintenance  Topic Date Due  . Tetanus Vaccine  11/17/1968  . Mammogram  01/28/2018  . Colon Cancer Screening  04/24/2018  . Flu Shot  Completed  . DEXA scan (bone density measurement)  Completed  .  Hepatitis C: One time screening is recommended by Center for Disease Control  (CDC) for  adults born from [birth year] through 1966.   Completed  . Pneumonia vaccines  Completed

## 2017-05-30 ENCOUNTER — Other Ambulatory Visit: Payer: Self-pay

## 2017-05-30 MED ORDER — TRAZODONE HCL 50 MG PO TABS: 100.0000 mg | ORAL_TABLET | Freq: Every day | ORAL | 1 refills | Status: DC

## 2017-06-13 ENCOUNTER — Encounter: Payer: Self-pay | Admitting: *Deleted

## 2017-06-14 ENCOUNTER — Ambulatory Visit
Admission: RE | Admit: 2017-06-14 | Discharge: 2017-06-14 | Disposition: A | Discharge status: Home / Self Care | Payer: Medicare HMO | Source: Ambulatory Visit | Attending: Unknown Physician Specialty | Admitting: Unknown Physician Specialty

## 2017-06-14 ENCOUNTER — Encounter
Admission: RE | Disposition: A | Discharge status: Home / Self Care | Payer: Self-pay | Source: Ambulatory Visit | Attending: Unknown Physician Specialty

## 2017-06-14 ENCOUNTER — Ambulatory Visit: Payer: Medicare HMO | Admitting: Anesthesiology

## 2017-06-14 ENCOUNTER — Other Ambulatory Visit: Payer: Self-pay

## 2017-06-14 ENCOUNTER — Encounter: Payer: Self-pay | Admitting: *Deleted

## 2017-06-14 DIAGNOSIS — D123 Benign neoplasm of transverse colon: Secondary | ICD-10-CM | POA: Diagnosis not present

## 2017-06-14 DIAGNOSIS — G20 Parkinson's disease: Secondary | ICD-10-CM | POA: Insufficient documentation

## 2017-06-14 DIAGNOSIS — Z86010 Personal history of colonic polyps: Secondary | ICD-10-CM | POA: Diagnosis not present

## 2017-06-14 DIAGNOSIS — Z8673 Personal history of transient ischemic attack (TIA), and cerebral infarction without residual deficits: Secondary | ICD-10-CM | POA: Insufficient documentation

## 2017-06-14 DIAGNOSIS — K640 First degree hemorrhoids: Secondary | ICD-10-CM | POA: Diagnosis not present

## 2017-06-14 DIAGNOSIS — I129 Hypertensive chronic kidney disease with stage 1 through stage 4 chronic kidney disease, or unspecified chronic kidney disease: Secondary | ICD-10-CM | POA: Diagnosis not present

## 2017-06-14 DIAGNOSIS — E7800 Pure hypercholesterolemia, unspecified: Secondary | ICD-10-CM | POA: Insufficient documentation

## 2017-06-14 DIAGNOSIS — K648 Other hemorrhoids: Secondary | ICD-10-CM | POA: Diagnosis not present

## 2017-06-14 DIAGNOSIS — Z1211 Encounter for screening for malignant neoplasm of colon: Secondary | ICD-10-CM | POA: Insufficient documentation

## 2017-06-14 DIAGNOSIS — N289 Disorder of kidney and ureter, unspecified: Secondary | ICD-10-CM | POA: Diagnosis not present

## 2017-06-14 DIAGNOSIS — M5137 Other intervertebral disc degeneration, lumbosacral region: Secondary | ICD-10-CM | POA: Insufficient documentation

## 2017-06-14 DIAGNOSIS — Z79899 Other long term (current) drug therapy: Secondary | ICD-10-CM | POA: Insufficient documentation

## 2017-06-14 DIAGNOSIS — F329 Major depressive disorder, single episode, unspecified: Secondary | ICD-10-CM | POA: Insufficient documentation

## 2017-06-14 DIAGNOSIS — F419 Anxiety disorder, unspecified: Secondary | ICD-10-CM | POA: Diagnosis not present

## 2017-06-14 DIAGNOSIS — Z902 Acquired absence of lung [part of]: Secondary | ICD-10-CM | POA: Diagnosis not present

## 2017-06-14 DIAGNOSIS — Z7982 Long term (current) use of aspirin: Secondary | ICD-10-CM | POA: Insufficient documentation

## 2017-06-14 DIAGNOSIS — K219 Gastro-esophageal reflux disease without esophagitis: Secondary | ICD-10-CM | POA: Diagnosis not present

## 2017-06-14 DIAGNOSIS — I10 Essential (primary) hypertension: Secondary | ICD-10-CM | POA: Insufficient documentation

## 2017-06-14 DIAGNOSIS — I739 Peripheral vascular disease, unspecified: Secondary | ICD-10-CM | POA: Insufficient documentation

## 2017-06-14 DIAGNOSIS — Z85038 Personal history of other malignant neoplasm of large intestine: Secondary | ICD-10-CM | POA: Diagnosis not present

## 2017-06-14 DIAGNOSIS — E669 Obesity, unspecified: Secondary | ICD-10-CM | POA: Insufficient documentation

## 2017-06-14 DIAGNOSIS — Z85118 Personal history of other malignant neoplasm of bronchus and lung: Secondary | ICD-10-CM | POA: Insufficient documentation

## 2017-06-14 DIAGNOSIS — J449 Chronic obstructive pulmonary disease, unspecified: Secondary | ICD-10-CM | POA: Insufficient documentation

## 2017-06-14 DIAGNOSIS — R0789 Other chest pain: Secondary | ICD-10-CM | POA: Insufficient documentation

## 2017-06-14 DIAGNOSIS — K635 Polyp of colon: Secondary | ICD-10-CM | POA: Insufficient documentation

## 2017-06-14 DIAGNOSIS — F418 Other specified anxiety disorders: Secondary | ICD-10-CM | POA: Diagnosis not present

## 2017-06-14 DIAGNOSIS — J45909 Unspecified asthma, uncomplicated: Secondary | ICD-10-CM | POA: Diagnosis not present

## 2017-06-14 DIAGNOSIS — Z881 Allergy status to other antibiotic agents status: Secondary | ICD-10-CM | POA: Insufficient documentation

## 2017-06-14 DIAGNOSIS — Z888 Allergy status to other drugs, medicaments and biological substances status: Secondary | ICD-10-CM | POA: Insufficient documentation

## 2017-06-14 DIAGNOSIS — N183 Chronic kidney disease, stage 3 (moderate): Secondary | ICD-10-CM | POA: Diagnosis not present

## 2017-06-14 HISTORY — PX: COLONOSCOPY WITH PROPOFOL: SHX5780

## 2017-06-14 HISTORY — DX: Unspecified asthma, uncomplicated: J45.909

## 2017-06-14 LAB — HM COLONOSCOPY

## 2017-06-14 IMAGING — CT CT NECK W/ CM
4 of 5 series · 15 of 33 positions shown, 17 images · IV contrast (iopamidol)
Comparison: None.

CLINICAL DATA: Left neck pain and swelling. History of lung cancer
and colon cancer

EXAM:
CT NECK WITH CONTRAST
TECHNIQUE: Multidetector CT imaging of the neck was performed using the
standard protocol following the bolus administration of intravenous
contrast.
CONTRAST:  75mL HDA9E4-YYY IOPAMIDOL (HDA9E4-YYY) INJECTION 61%

[Series 2: axial neck · axial · 0.56mm/px · z∈[+354,+468]mm · 4 of 95 slices shown, 5 images]
[im 19/95  soft-tissue]
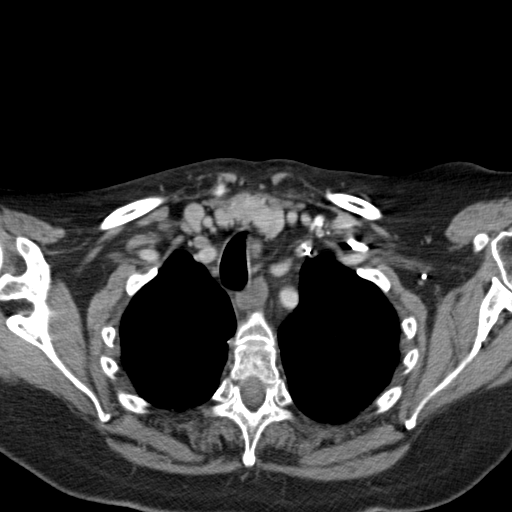
[im 19/95  bone]
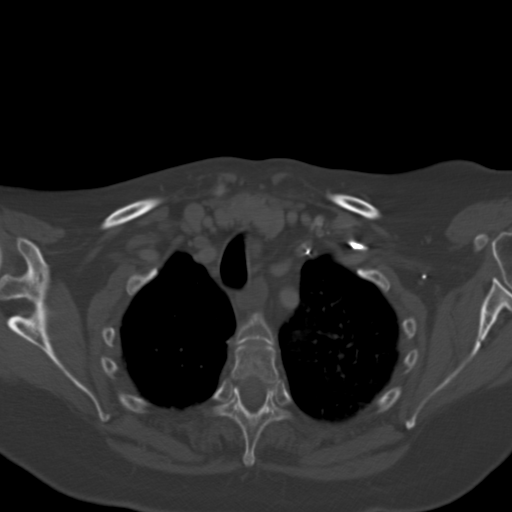
[im 38/95  bone]
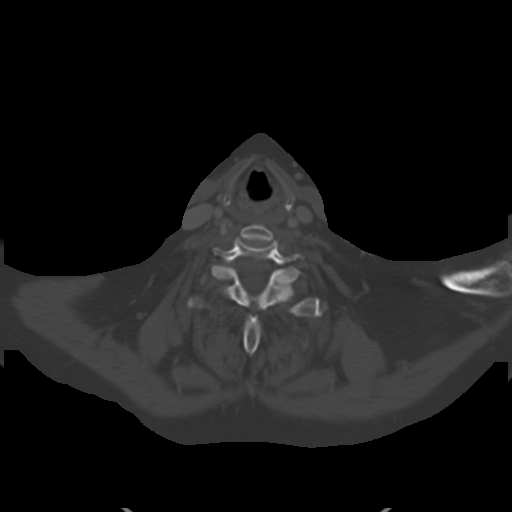
[im 57/95  bone]
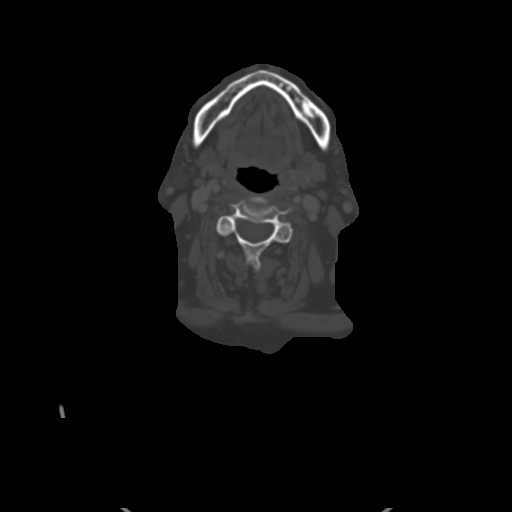
[im 76/95  bone]
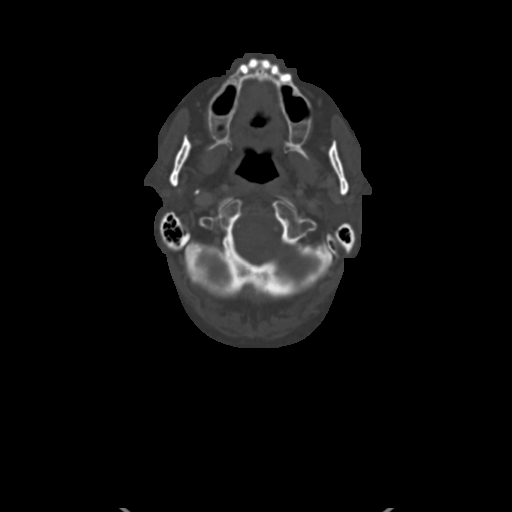

[Series 4: sag neck · sagittal · 0.41mm/px · 5 of 73 slices shown, 6 images]
[im 25/73  bone]
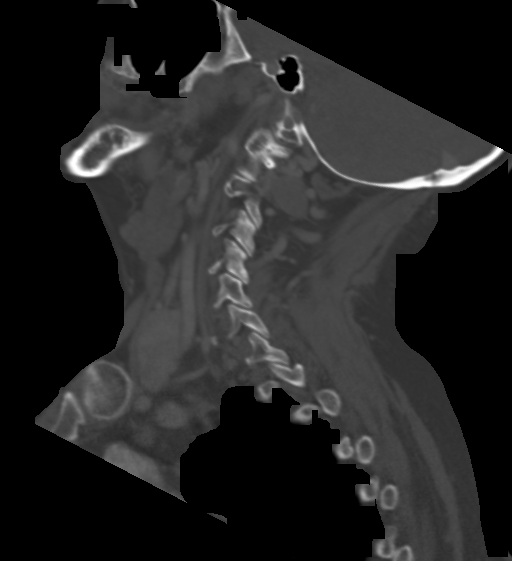
[im 31/73  bone]
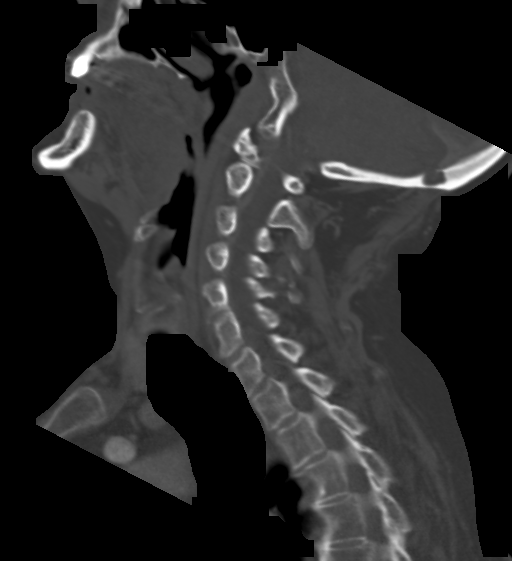
[im 37/73  soft-tissue]
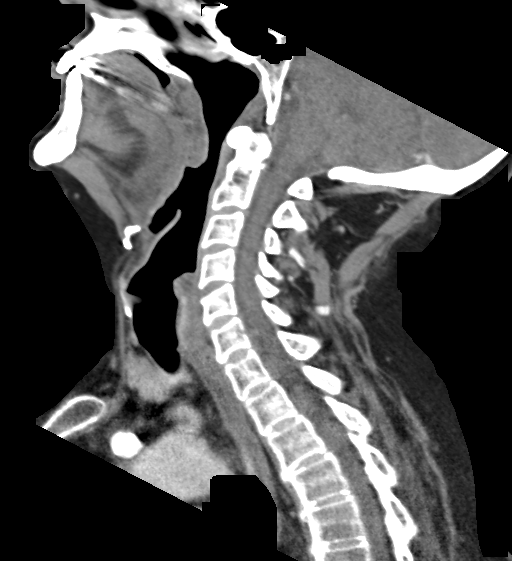
[im 37/73  bone]
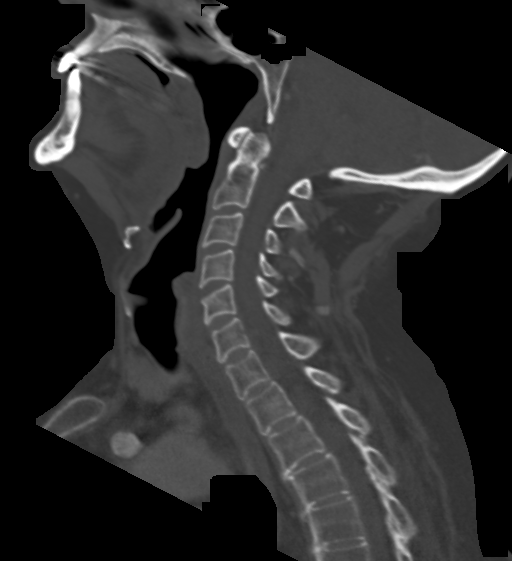
[im 43/73  bone]
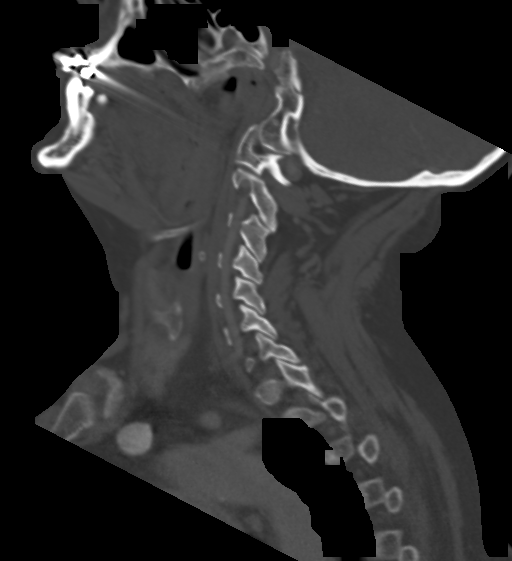
[im 49/73  bone]
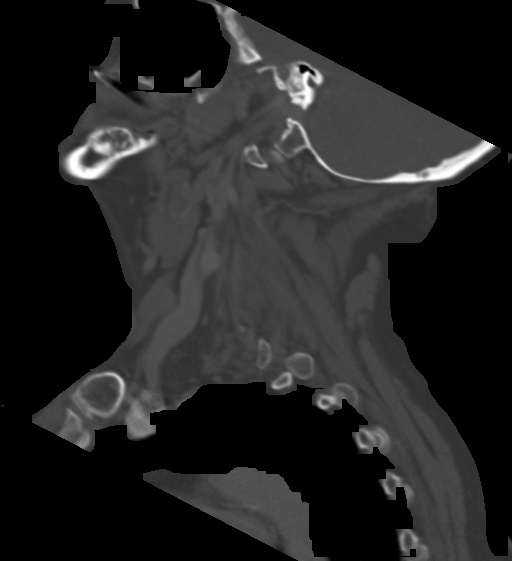

[Series 5: cor neck · coronal · 0.34mm/px · 3 of 102 slices shown]
[im 30/102  bone]
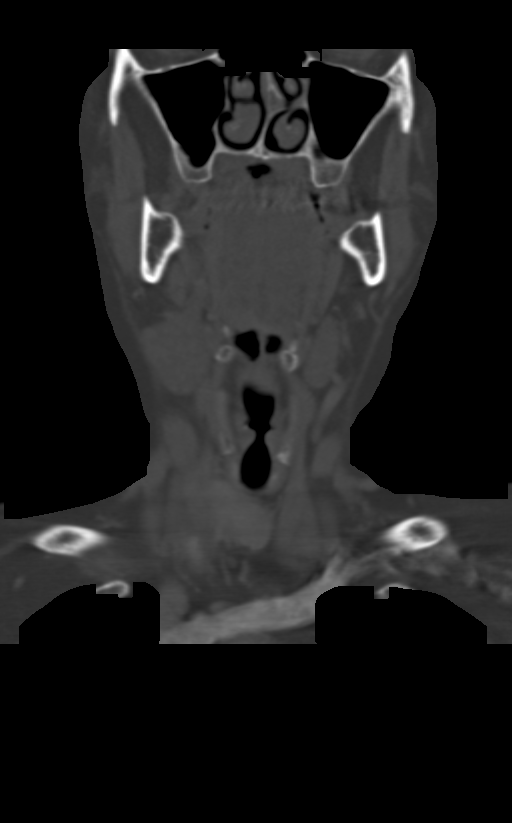
[im 44/102  bone]
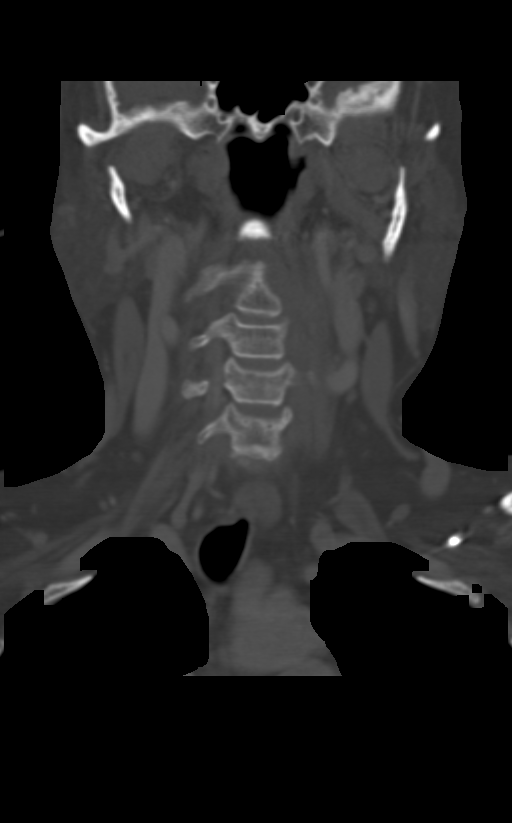
[im 58/102  bone]
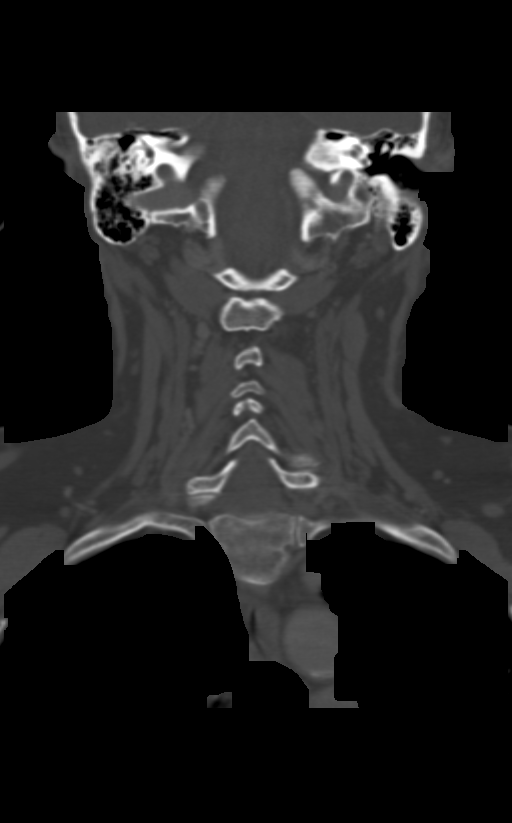

[Series 6: ax oropharynx · axial · 0.33mm/px · z∈[+313,+391]mm · 3 of 109 slices shown]
[im 22/109  bone]
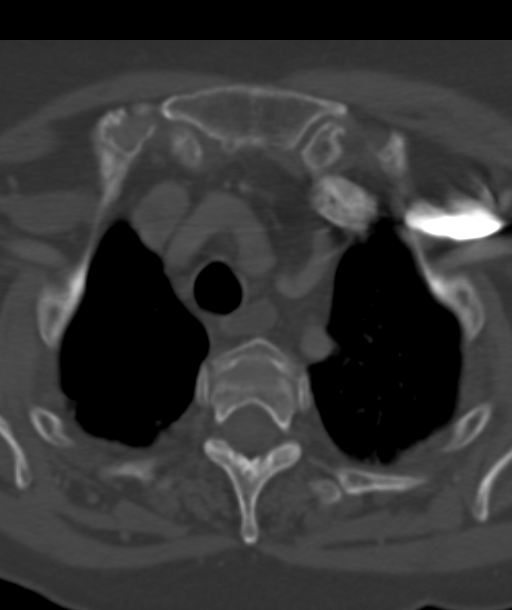
[im 44/109  bone]
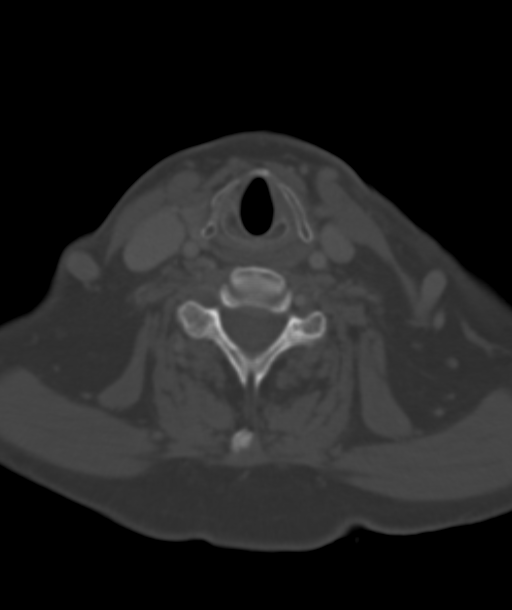
[im 65/109  bone]
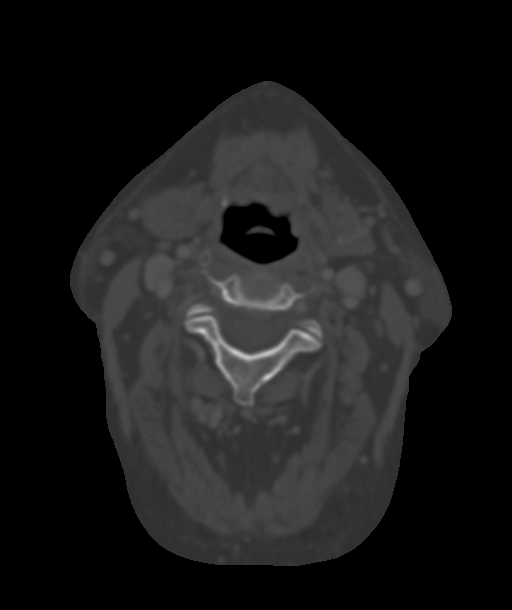

[15 of 33 positions shown; findings below may reference images not displayed]

FINDINGS: Pharynx and larynx: Normal pharynx. Oral cavity normal. Epiglottis
and larynx normal. No airway compromise

Salivary glands: Parotid and submandibular glands normal
bilaterally.

Thyroid: Heterogeneous thyroid without dominant mass or enlarged

Lymph nodes: Negative for cervical adenopathy.

Vascular: Carotid artery normal.  Jugular vein patent bilaterally.

Limited intracranial: Negative

Visualized orbits: Negative

Mastoids and visualized paranasal sinuses: Negative

Skeleton: Negative

Upper chest: Lung apices clear without infiltrate or effusion.

No evidence of soft tissue edema or mass lesion in the neck.
IMPRESSION: Negative

## 2017-06-14 IMAGING — CR DG CHEST 2V
1 series · 2 of 2 positions shown · non-contrast
Comparison: April 26, 2014

CLINICAL DATA: Shortness of breath ; nausea

EXAM:
CHEST  2 VIEW

[Series 1: dg chest 2 view · 0.14mm/px · 2 of 2 slices shown]
[im 1/2]
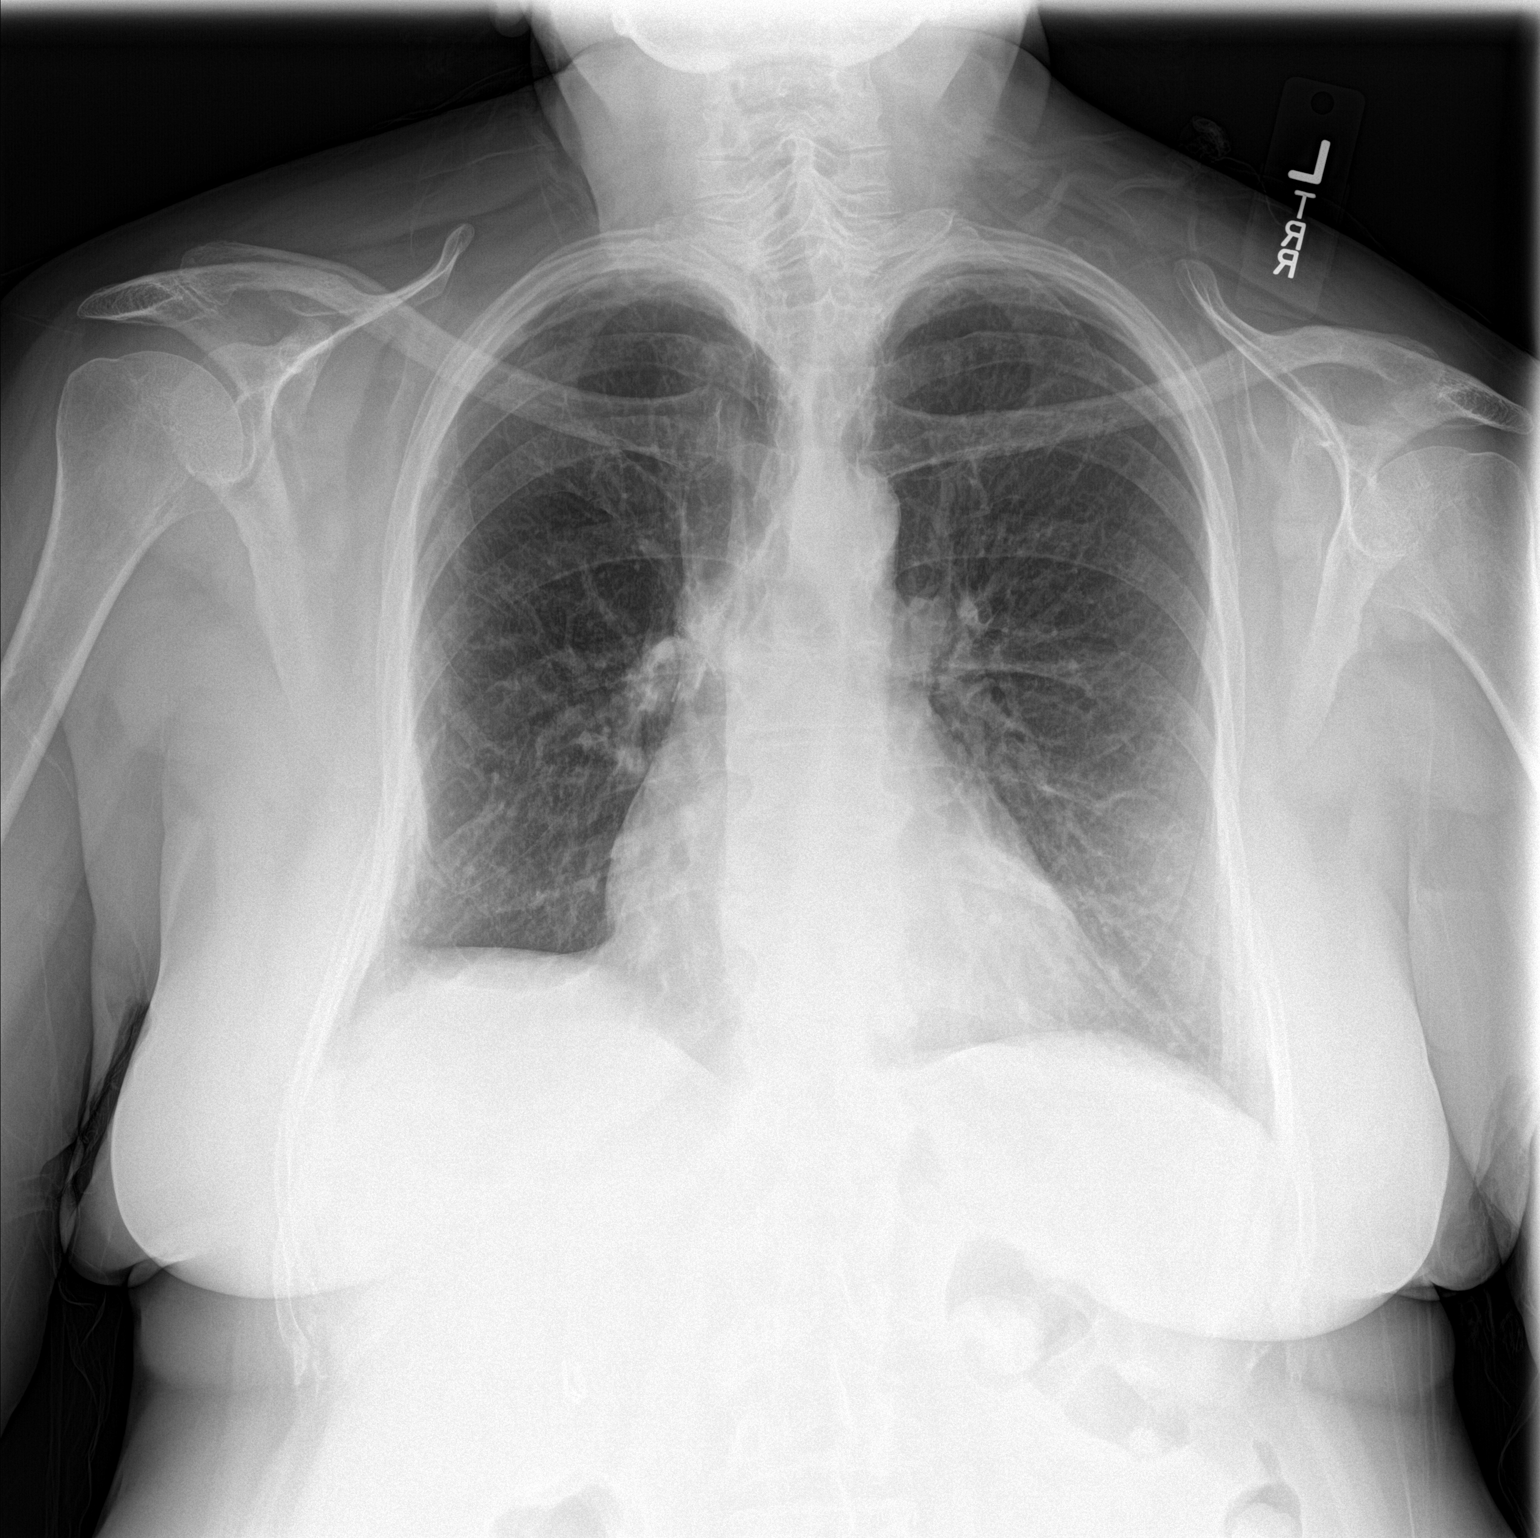
[im 2/2]
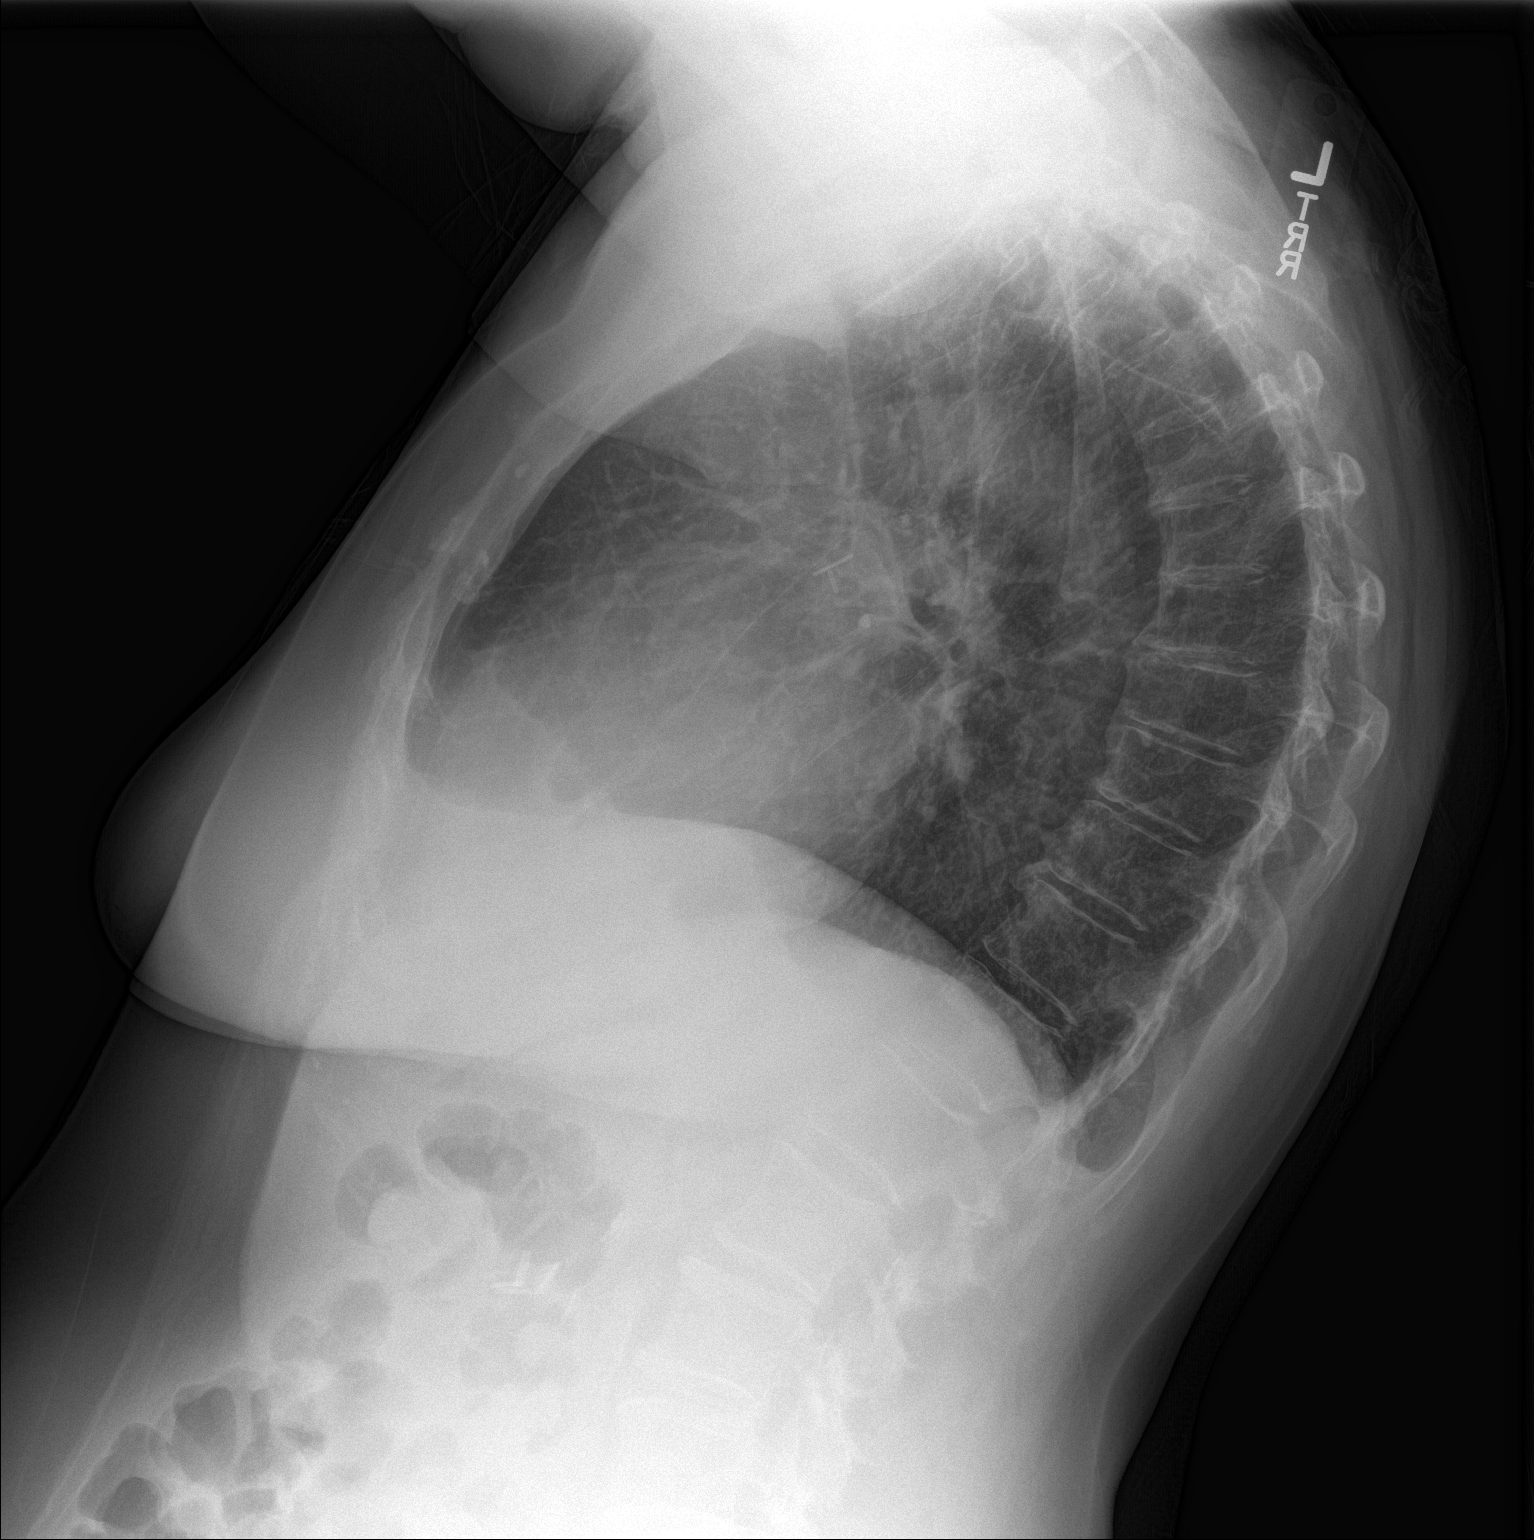

[2 of 2 positions shown; findings below may reference images not displayed]

FINDINGS: There is no edema or consolidation. The heart size and pulmonary
vascularity are normal. No adenopathy. There is postoperative change
in the right perihilar region. There is degenerative change in the
thoracic spine.
IMPRESSION: Postoperative change on the right.  No edema or consolidation.

## 2017-06-14 SURGERY — COLONOSCOPY WITH PROPOFOL
Anesthesia: General

## 2017-06-14 MED ORDER — PROPOFOL 500 MG/50ML IV EMUL
INTRAVENOUS | Status: DC | PRN
  Administered 2017-06-14: 120 ug/kg/min via INTRAVENOUS

## 2017-06-14 MED ORDER — FENTANYL CITRATE (PF) 100 MCG/2ML IJ SOLN
INTRAMUSCULAR | Status: DC | PRN
  Administered 2017-06-14: 50 ug via INTRAVENOUS

## 2017-06-14 MED ORDER — FLEET ENEMA 7-19 GM/118ML RE ENEM
1.0000 | ENEMA | Freq: Once | RECTAL | Status: AC
  Administered 2017-06-14: 1 via RECTAL

## 2017-06-14 MED ORDER — MIDAZOLAM HCL 2 MG/2ML IJ SOLN
INTRAMUSCULAR | Status: AC
  Filled 2017-06-14: qty 2

## 2017-06-14 MED ORDER — MIDAZOLAM HCL 2 MG/2ML IJ SOLN
INTRAMUSCULAR | Status: DC | PRN
  Administered 2017-06-14: 2 mg via INTRAVENOUS

## 2017-06-14 MED ORDER — EPHEDRINE SULFATE 50 MG/ML IJ SOLN
INTRAMUSCULAR | Status: DC | PRN
  Administered 2017-06-14: 10 mg via INTRAVENOUS

## 2017-06-14 MED ORDER — EPHEDRINE SULFATE 50 MG/ML IJ SOLN
INTRAMUSCULAR | Status: AC
  Filled 2017-06-14: qty 1

## 2017-06-14 MED ORDER — SODIUM CHLORIDE 0.9 % IV SOLN
INTRAVENOUS | Status: DC
  Administered 2017-06-14: 09:00:00 via INTRAVENOUS

## 2017-06-14 MED ORDER — FENTANYL CITRATE (PF) 100 MCG/2ML IJ SOLN
INTRAMUSCULAR | Status: AC
  Filled 2017-06-14: qty 2

## 2017-06-14 MED ORDER — PROPOFOL 500 MG/50ML IV EMUL
INTRAVENOUS | Status: AC
  Filled 2017-06-14: qty 50

## 2017-06-14 MED ORDER — SODIUM CHLORIDE 0.9 % IV SOLN: INTRAVENOUS | Status: DC

## 2017-06-14 NOTE — Transfer of Care (Signed)
Immediate Anesthesia Transfer of Care Note  Patient: Victoria Hanson  Procedure(s) Performed: COLONOSCOPY WITH PROPOFOL (N/A )  Patient Location: PACU  Anesthesia Type:General  Level of Consciousness: awake and sedated  Airway & Oxygen Therapy: Patient Spontanous Breathing and Patient connected to nasal cannula oxygen  Post-op Assessment: Report given to RN and Post -op Vital signs reviewed and stable  Post vital signs: Reviewed and stable  Last Vitals:  Vitals:   06/14/17 0826  BP: 140/71  Pulse: 70  Resp: 18  Temp: (!) 35.8 C  SpO2: 100%    Last Pain:  Vitals:   06/14/17 0826  TempSrc: Tympanic         Complications: No apparent anesthesia complications

## 2017-06-14 NOTE — Op Note (Signed)
[facility] Gastroenterology Patient Name: Victoria Hanson Procedure Date: 06/14/2017 9:07 AM MRN: [id number] Account #: [id number] Date of Birth: [birth year] Admit Type: Outpatient Age: 67 Room: [facility] ENDO ROOM 1 Gender: Female Note Status: Finalized Procedure:            Colonoscopy Indications:          High risk colon cancer surveillance: Personal history                        of colon cancer Providers:            Manya Silvas, MD Referring MD:         Einar Pheasant, MD (Referring MD) Medicines:            Propofol per Anesthesia Complications:        No immediate complications. Procedure:            Pre-Anesthesia Assessment:                       - After reviewing the risks and benefits, the patient                        was deemed in satisfactory condition to undergo the                        procedure.                       After obtaining informed consent, the colonoscope was                        passed under direct vision. Throughout the procedure,                        the patient's blood pressure, pulse, and oxygen                        saturations were monitored continuously. The                        Colonoscope was introduced through the anus and                        advanced to the the cecum, identified by appendiceal                        orifice and ileocecal valve. The colonoscopy was                        performed without difficulty. The patient tolerated the                        procedure well. The quality of the bowel preparation                        was excellent. Findings:      A diminutive polyp was found in the transverse colon. The polyp was       sessile. The polyp was removed with a jumbo cold forceps. Resection and       retrieval were complete.      A diminutive polyp was found  in the transverse colon. The polyp was       sessile. The polyp was removed with a cold snare. Resection and       retrieval were  complete.      Internal hemorrhoids were found during endoscopy. The hemorrhoids were       small and Grade I (internal hemorrhoids that do not prolapse).      The exam was otherwise without abnormality. Impression:           - One diminutive polyp in the transverse colon, removed                        with a jumbo cold forceps. Resected and retrieved.                       - One diminutive polyp in the transverse colon, removed                        with a cold snare. Resected and retrieved.                       - Internal hemorrhoids.                       - The examination was otherwise normal. Recommendation:       - Await pathology results. Likely repeat in 5 years. Manya Silvas, MD 06/14/2017 9:43:55 AM This report has been signed electronically. Number of Addenda: 0 Note Initiated On: 06/14/2017 9:07 AM Scope Withdrawal Time: 0 hours 9 minutes 14 seconds  Total Procedure Duration: 0 hours 18 minutes 41 seconds       [facility]

## 2017-06-14 NOTE — Anesthesia Preprocedure Evaluation (Signed)
Anesthesia Evaluation  Patient identified by MRN, date of birth, ID band Patient awake    Reviewed: Allergy & Precautions, NPO status , Patient's Chart, lab work & pertinent test results  History of Anesthesia Complications Negative for: history of anesthetic complications  Airway Mallampati: II       Dental  (+) Implants   Pulmonary neg sleep apnea, COPD,  COPD inhaler,           Cardiovascular hypertension, Pt. on medications + Peripheral Vascular Disease  (-) Past MI and (-) CHF + dysrhythmias (occassional palpitation) (-) Valvular Problems/Murmurs     Neuro/Psych Anxiety Depression TIA   GI/Hepatic Neg liver ROS, GERD  Medicated and Controlled,  Endo/Other  neg diabetes  Renal/GU Renal InsufficiencyRenal disease     Musculoskeletal   Abdominal   Peds  Hematology   Anesthesia Other Findings   Reproductive/Obstetrics                             Anesthesia Physical Anesthesia Plan  ASA: III  Anesthesia Plan: General   Post-op Pain Management:    Induction:   PONV Risk Score and Plan: 3 and TIVA, Propofol infusion and Ondansetron  Airway Management Planned: Nasal Cannula  Additional Equipment:   Intra-op Plan:   Post-operative Plan:   Informed Consent: I have reviewed the patients History and Physical, chart, labs and discussed the procedure including the risks, benefits and alternatives for the proposed anesthesia with the patient or authorized representative who has indicated his/her understanding and acceptance.     Plan Discussed with:   Anesthesia Plan Comments:         Anesthesia Quick Evaluation

## 2017-06-14 NOTE — Anesthesia Postprocedure Evaluation (Signed)
Anesthesia Post Note  Patient: Victoria Hanson  Procedure(s) Performed: COLONOSCOPY WITH PROPOFOL (N/A )  Patient location during evaluation: Endoscopy Anesthesia Type: General Level of consciousness: awake and alert Pain management: pain level controlled Vital Signs Assessment: post-procedure vital signs reviewed and stable Respiratory status: spontaneous breathing and respiratory function stable Cardiovascular status: stable Anesthetic complications: no     Last Vitals:  Vitals:   06/14/17 0826 06/14/17 0943  BP: 140/71 (!) 99/55  Pulse: 70 73  Resp: 18 14  Temp: (!) 35.8 C (!) 35.8 C  SpO2: 100% 99%    Last Pain:  Vitals:   06/14/17 0943  TempSrc: Tympanic                 Artemis Loyal K

## 2017-06-14 NOTE — H&P (Signed)
Primary Care Physician:  Einar Pheasant, MD Primary Gastroenterologist:  Dr. Vira Agar  Pre-Procedure History & Physical: HPI:  Victoria Hanson is a 67 y.o. female is here for an colonoscopy.   Past Medical History:  Diagnosis Date  . Anxiety   . Asthma   . Atypical chest pain    a. 2003 Cath: reportedly nl per pt [name]);  b. 04/2015 [place] Admission, neg troponin.  . Colon cancer ([place])    a. 1999: Pt had partial colectomy. Did develop lung metastasis. Had right upper lobectomy in 01/2007 then had associated chemotherapy.  Marland Kitchen COPD (chronic obstructive pulmonary disease) ([place])   . DDD (degenerative disc disease), lumbosacral   . Depression   . Echocardiogram abnormal    a. 02/2010 Echo: EF 55-60%, mild LAE, no regional wall motion abnormalities  . Elevated transaminase level    a. ? NASH;  b. 06/2015 - nl LFTs.  . GERD (gastroesophageal reflux disease)   . Hematuria   . Holter monitor, abnormal    a. 12/2009 Holter monitoring: NSR, rare PACs and PVCs.  Marland Kitchen Hypercholesterolemia   . Hyperlipidemia   . Hypertension   . Leaky heart valve    a. Per pt report - no evidence of valvular abnormalities on prior echoes.  . Lung cancer ([place])    a. S/P resection (lobectomy - right)  . Morbid obesity ([place])   . Nephrolithiasis   . Parkinson disease ([place]) 08/2016   [name]    Past Surgical History:  Procedure Laterality Date  . BREAST EXCISIONAL BIOPSY Right 10/07/2009   benign  . CARDIAC CATHETERIZATION  2003   negative as per pt report  . CHOLECYSTECTOMY  2000  . COLON SURGERY     Colon cancer, removed part of large colon  . COLONOSCOPY N/A 04/25/2015   Procedure: COLONOSCOPY;  Surgeon: Manya Silvas, MD;  [facility];  Service: Endoscopy;  Laterality: N/A;  . LOBECTOMY     Right lung, upper lobe right side removed  . TOTAL ABDOMINAL HYSTERECTOMY     abnormal bleeding  . TUBAL LIGATION  1980    Prior to Admission medications   Medication Sig Start Date End  Date Taking? Authorizing Provider  ALPRAZolam Duanne Moron) 0.5 MG tablet Take 1 tablet (0.5 mg total) daily as needed by mouth for anxiety. 04/17/17  Yes Einar Pheasant, MD  aspirin EC 81 MG tablet Take 81 mg by mouth daily.   Yes [provider]  carbidopa-levodopa (SINEMET IR) 25-250 MG tablet Take 1 tablet by mouth 3 (three) times daily.   Yes [provider]  fluticasone-salmeterol (ADVAIR HFA) 115-21 MCG/ACT inhaler Inhale 2 puffs into the lungs 2 (two) times daily. 12/31/16  Yes Wilhelmina Mcardle, MD  Multiple Vitamin (MULTIVITAMIN) tablet Take 1 tablet by mouth daily.   Yes [provider]  omeprazole (PRILOSEC) 20 MG capsule TAKE 1 CAPSULE BY MOUTH EVERY DAY 03/20/17  Yes Einar Pheasant, MD  potassium chloride SA (K-DUR,KLOR-CON) 20 MEQ tablet TAKE ONE TABLET BY MOUTH TWICE A DAY 02/19/17  Yes Einar Pheasant, MD  traZODone (DESYREL) 50 MG tablet Take 2 tablets (100 mg total) by mouth at bedtime. 05/30/17  Yes Einar Pheasant, MD  triamterene-hydrochlorothiazide (MAXZIDE-25) 37.5-25 MG tablet TAKE 1 TABLET BY MOUTH EVERY DAY 02/19/17  Yes Einar Pheasant, MD  VENTOLIN HFA 108 (90 Base) MCG/ACT inhaler INHALE 2 PUFFS BY MOUTH INTO THE LUNGS EVERY 6 HOURS AS NEEDED FOR WHEEZING 05/13/17  Yes Einar Pheasant, MD  triamterene-hydrochlorothiazide [facility]) 37.5-25  MG tablet TAKE 1 TABLET BY MOUTH EVERY DAY 02/19/17   Einar Pheasant, MD    Allergies as of 04/17/2017 - Review Complete 04/17/2017  Allergen Reaction Noted  . Macrobid [nitrofurantoin macrocrystal] Shortness Of Breath and Palpitations 04/24/2012  . Antihistamines, diphenhydramine-type Other (See Comments) 10/18/2014  . Aspirin Other (See Comments) 10/18/2014  . Ceftin [cefuroxime axetil] Other (See Comments) 04/24/2012  . Celecoxib Other (See Comments) 10/18/2014  . Cymbalta [duloxetine hcl] Other (See Comments) 04/24/2012  . Diphenhydramine Other (See Comments) 04/22/2015  . Escitalopram Other (See Comments)  04/22/2015  . Lexapro [escitalopram oxalate] Other (See Comments) 02/19/2013  . Metronidazole Other (See Comments) 10/18/2014  . Nitrofurantoin Other (See Comments) 11/16/2014  . Zoloft [sertraline hcl]  04/24/2012  . Ciprofloxacin Other (See Comments) 12/31/2012    Family History  Problem Relation Age of Onset  . Coronary artery disease Mother        died @ 30 of MI.  . Mental illness Mother   . Diabetes Mother   . Heart attack Father 56       MI  . Cancer Father        lung - died in his mid-70's  . Hypertension Father   . Diabetes Father   . Sudden death Brother   . Mental illness Brother   . Heart attack Brother        MI @ 58, died @ age 9.  . Breast cancer Neg Hx     Social History   Socioeconomic History  . Marital status: Widowed    Spouse name: Not on file  . Number of children: Not on file  . Years of education: Not on file  . Highest education level: Not on file  Social Needs  . Financial resource strain: Not on file  . Food insecurity - worry: Not on file  . Food insecurity - inability: Not on file  . Transportation needs - medical: Not on file  . Transportation needs - non-medical: Not on file  Occupational History  . Not on file  Tobacco Use  . Smoking status: Passive Smoke Exposure - Never Smoker  . Smokeless tobacco: Never Used  . Tobacco comment: husband and sons smoked in her home.  Substance and Sexual Activity  . Alcohol use: Yes    Alcohol/week: 0.6 oz    Types: 1 Glasses of wine per week    Comment: rare wine cooler.  . Drug use: No  . Sexual activity: No  Other Topics Concern  . Not on file  Social History Narrative   Widowed.   Disabled (colon/lung CA), retired.   Lives in [place] with her dtr, grand-dtr, and son.   Active.   Gets regular exercise, walks.    Review of Systems: See HPI, otherwise negative ROS  Physical Exam: BP 140/71   Pulse 70   Temp (!) 96.4 F (35.8 C) (Tympanic)   Resp 18   SpO2 100%   General:   Alert,  pleasant and cooperative in NAD Head:  Normocephalic and atraumatic. Neck:  Supple; no masses or thyromegaly. Lungs:  Clear throughout to auscultation.    Heart:  Regular rate and rhythm. Abdomen:  Soft, nontender and nondistended. Normal bowel sounds, without guarding, and without rebound.   Neurologic:  Alert and  oriented x4;  grossly normal neurologically.  Impression/Plan: CAILEY TRIGUEROS is here for an colonoscopy to be performed for [facility] colon cancer.  Risks, benefits, limitations, and alternatives regarding  colonoscopy have been reviewed  with the patient.  Questions have been answered.  All parties agreeable.   Gaylyn Cheers, MD  06/14/2017, 9:15 AM

## 2017-06-14 NOTE — Anesthesia Procedure Notes (Signed)
Performed by: Vaughan Sine Pre-anesthesia Checklist: Patient identified, Emergency Drugs available, Suction available and Patient being monitored Patient Re-evaluated:Patient Re-evaluated prior to induction Oxygen Delivery Method: Nasal cannula Preoxygenation: Pre-oxygenation with 100% oxygen Induction Type: IV induction Placement Confirmation: CO2 detector and positive ETCO2

## 2017-06-14 NOTE — Anesthesia Post-op Follow-up Note (Signed)
Anesthesia QCDR form completed.        

## 2017-06-17 ENCOUNTER — Encounter: Payer: Self-pay | Admitting: Unknown Physician Specialty

## 2017-06-17 LAB — SURGICAL PATHOLOGY

## 2017-07-02 IMAGING — CR DG CHEST 2V
1 series · 2 of 2 positions shown · non-contrast
Comparison: Chest radiograph performed 10/20/2015

CLINICAL DATA: Acute onset of palpitations and generalized chest
pain. Initial encounter.

EXAM:
CHEST  2 VIEW

[Series 1: dg chest 2 view · 0.14mm/px · 2 of 2 slices shown]
[im 1/2]
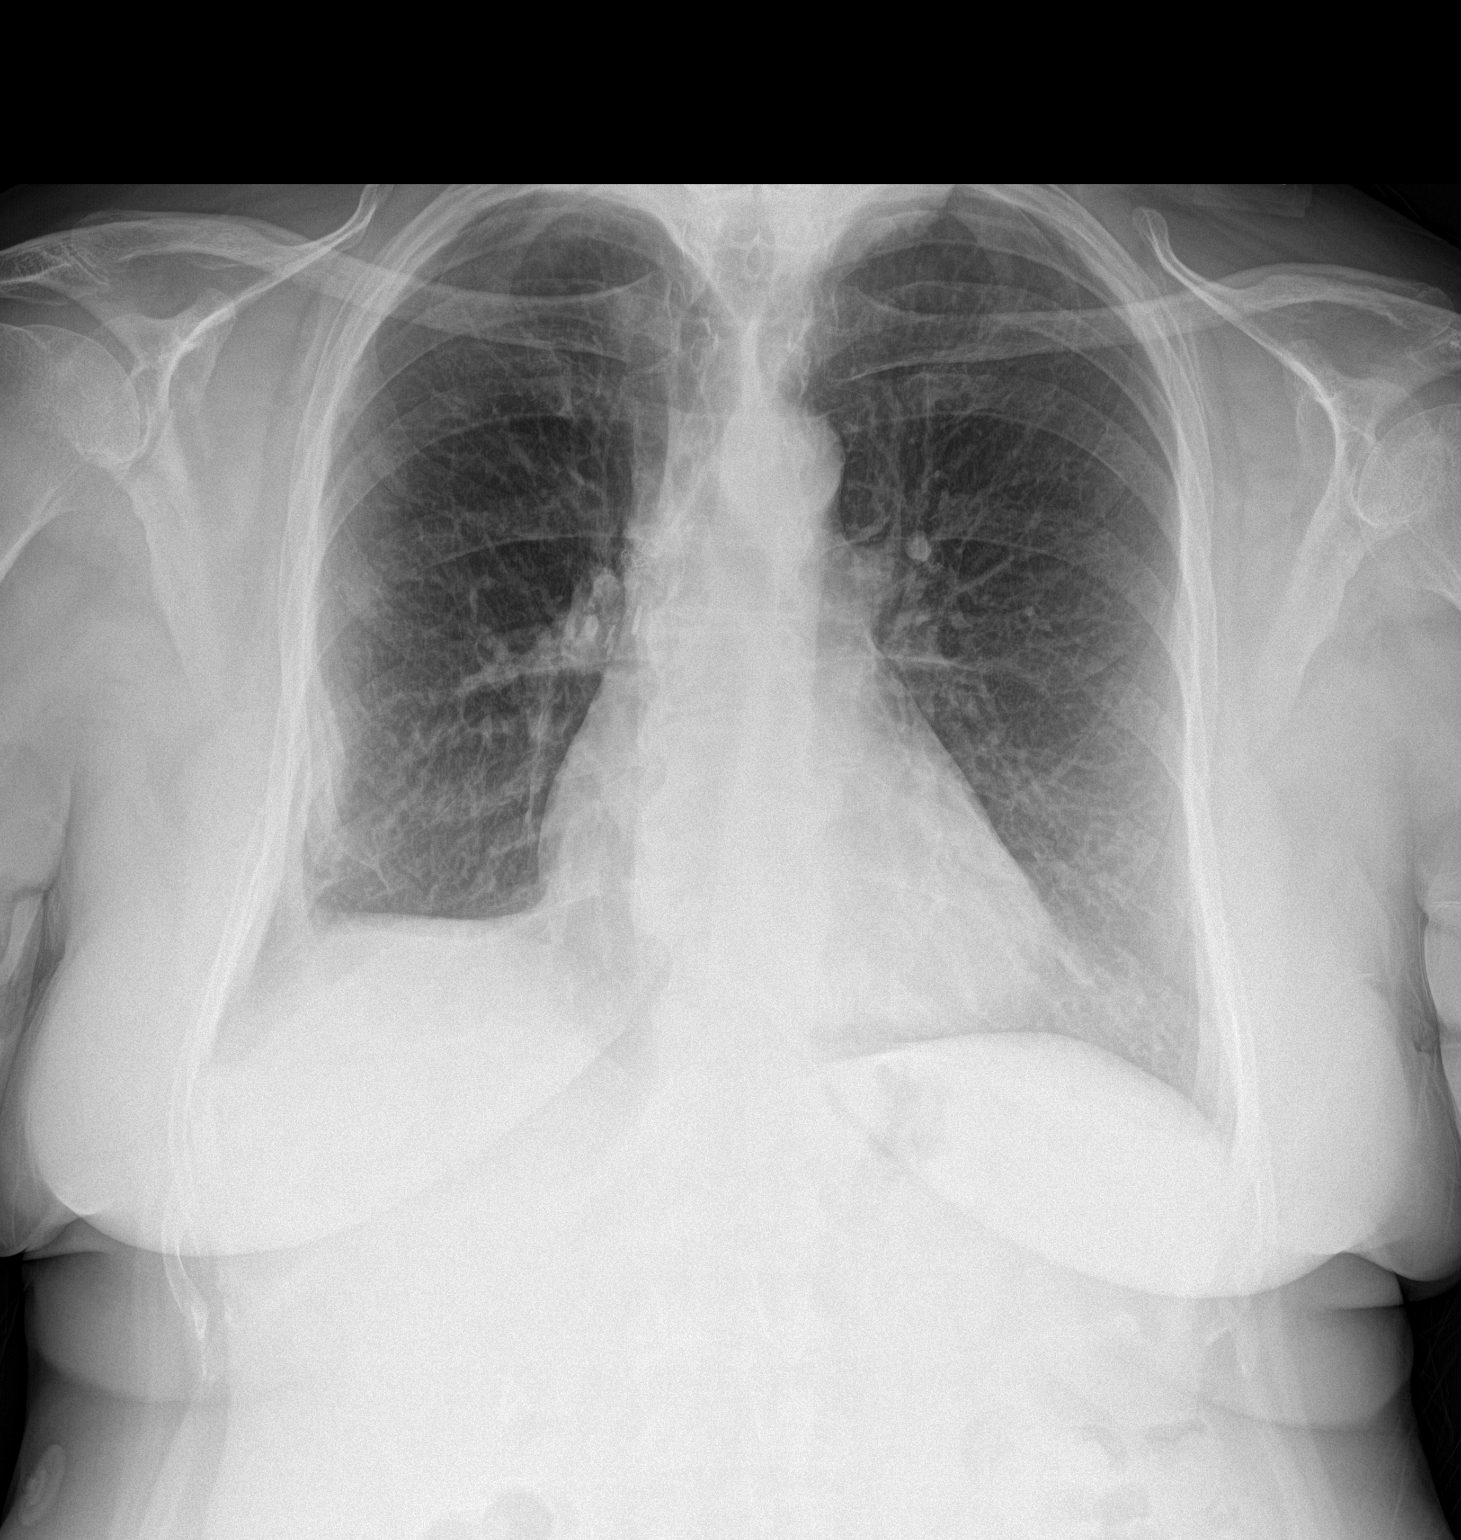
[im 2/2]
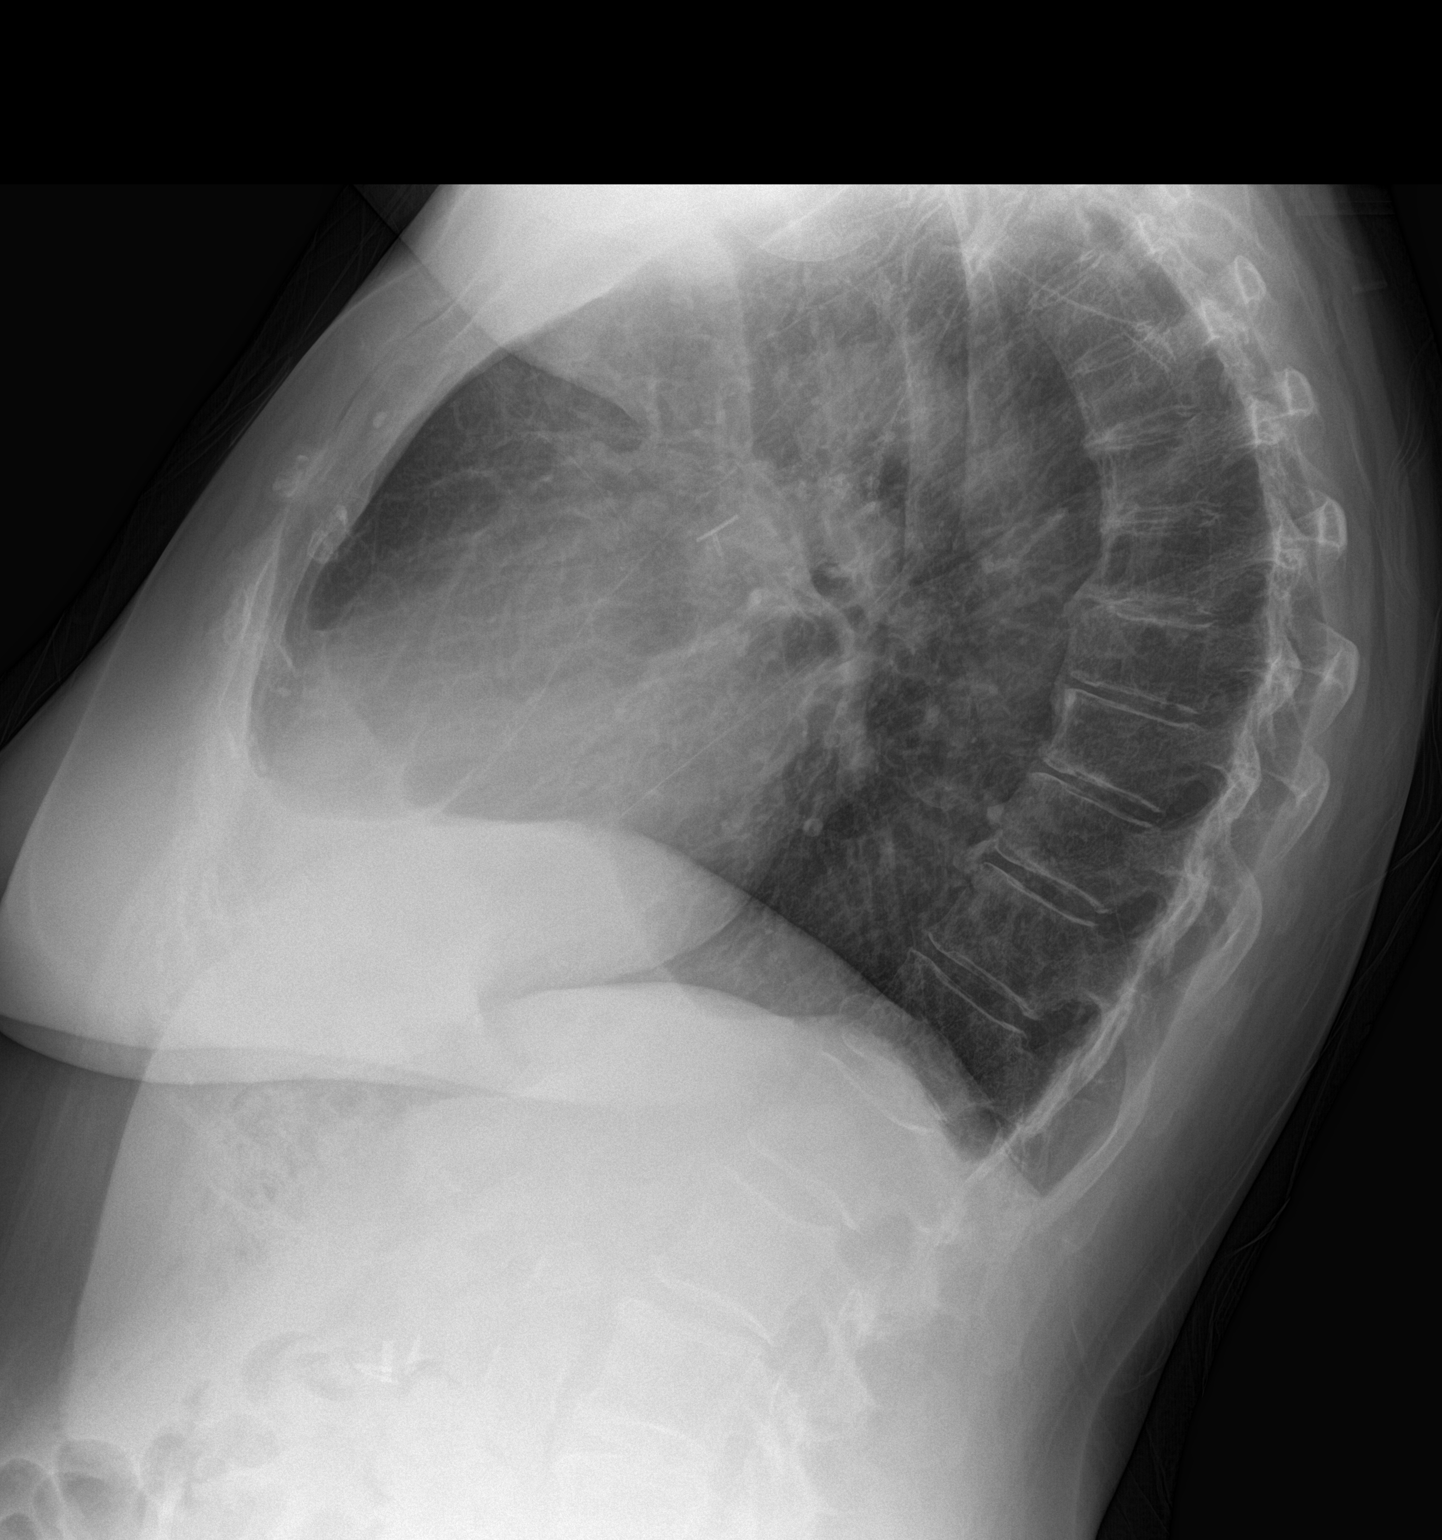

[2 of 2 positions shown; findings below may reference images not displayed]

FINDINGS: The lungs are well-aerated. Minimal bibasilar atelectasis is noted.
There is no evidence of pleural effusion or pneumothorax.

The heart is borderline normal in size. No acute osseous
abnormalities are seen. Clips are noted within the right upper
quadrant, reflecting prior cholecystectomy.
IMPRESSION: Minimal bibasilar atelectasis noted.  Lungs otherwise clear.

## 2017-07-10 ENCOUNTER — Other Ambulatory Visit: Payer: Self-pay | Admitting: Internal Medicine

## 2017-07-10 NOTE — Telephone Encounter (Signed)
Please confirm with pt that she has been taking regularly.  If so, ok to refill.

## 2017-07-10 NOTE — Telephone Encounter (Signed)
Okay to refill Potassium? Pt also on Triam/HCTZ (was flagged when I tried to refill it)

## 2017-07-11 ENCOUNTER — Other Ambulatory Visit: Payer: Self-pay | Admitting: Internal Medicine

## 2017-07-12 NOTE — Telephone Encounter (Signed)
Left message to call back  

## 2017-07-15 ENCOUNTER — Other Ambulatory Visit: Payer: Self-pay

## 2017-07-15 ENCOUNTER — Telehealth: Payer: Self-pay | Admitting: Internal Medicine

## 2017-07-15 MED ORDER — TRAZODONE HCL 50 MG PO TABS: 100.0000 mg | ORAL_TABLET | Freq: Every day | ORAL | 1 refills | Status: DC

## 2017-07-15 NOTE — Telephone Encounter (Signed)
Gave verbal to [facility] to fill 60 tabs instead of 30. Changed script in Epic as well.

## 2017-07-15 NOTE — Telephone Encounter (Signed)
Copied from [place] [id number]. Topic: Quick Communication - See Telephone Encounter >> Jul 15, 2017  3:14 PM Burnis Medin, NT wrote: CRM for notification. See Telephone encounter for: Patient called and said that she takes traZODone (DESYREL) 50 MG tablet two times a day and they gave her a prescription for 30 pills and should be 60. Pt wanted to see if the doctor could cal in the rest of her pills. Pt uses [place] [place] [phone] (Phone) [phone] (Fax)    07/15/17.

## 2017-07-15 NOTE — Telephone Encounter (Signed)
Patient said that she takes trazodone  50 MG , two tablets each HS as prescribed. Prescription refilled  for 30 pills instead of  60. Pt requesting adjustment.   [facility] [address] [place], [place] - [place]    [phone] (Phone) [phone] (Fax)

## 2017-07-22 ENCOUNTER — Ambulatory Visit: Payer: Medicare HMO | Admitting: Internal Medicine

## 2017-07-29 ENCOUNTER — Ambulatory Visit (INDEPENDENT_AMBULATORY_CARE_PROVIDER_SITE_OTHER): Payer: Medicare HMO | Admitting: Internal Medicine

## 2017-07-29 ENCOUNTER — Encounter: Payer: Self-pay | Admitting: Internal Medicine

## 2017-07-29 DIAGNOSIS — K219 Gastro-esophageal reflux disease without esophagitis: Secondary | ICD-10-CM

## 2017-07-29 DIAGNOSIS — Z85038 Personal history of other malignant neoplasm of large intestine: Secondary | ICD-10-CM | POA: Diagnosis not present

## 2017-07-29 DIAGNOSIS — N183 Chronic kidney disease, stage 3 unspecified: Secondary | ICD-10-CM

## 2017-07-29 DIAGNOSIS — F329 Major depressive disorder, single episode, unspecified: Secondary | ICD-10-CM | POA: Diagnosis not present

## 2017-07-29 DIAGNOSIS — F330 Major depressive disorder, recurrent, mild: Secondary | ICD-10-CM

## 2017-07-29 DIAGNOSIS — Z6833 Body mass index (BMI) 33.0-33.9, adult: Secondary | ICD-10-CM | POA: Diagnosis not present

## 2017-07-29 DIAGNOSIS — R739 Hyperglycemia, unspecified: Secondary | ICD-10-CM

## 2017-07-29 DIAGNOSIS — Z859 Personal history of malignant neoplasm, unspecified: Secondary | ICD-10-CM

## 2017-07-29 DIAGNOSIS — E7800 Pure hypercholesterolemia, unspecified: Secondary | ICD-10-CM

## 2017-07-29 DIAGNOSIS — F419 Anxiety disorder, unspecified: Secondary | ICD-10-CM | POA: Diagnosis not present

## 2017-07-29 DIAGNOSIS — G20 Parkinson's disease: Secondary | ICD-10-CM | POA: Diagnosis not present

## 2017-07-29 DIAGNOSIS — J449 Chronic obstructive pulmonary disease, unspecified: Secondary | ICD-10-CM

## 2017-07-29 DIAGNOSIS — G20A1 Parkinson's disease without dyskinesia, without mention of fluctuations: Secondary | ICD-10-CM

## 2017-07-29 DIAGNOSIS — Z8589 Personal history of malignant neoplasm of other organs and systems: Secondary | ICD-10-CM

## 2017-07-29 DIAGNOSIS — I10 Essential (primary) hypertension: Secondary | ICD-10-CM

## 2017-07-29 DIAGNOSIS — Z85118 Personal history of other malignant neoplasm of bronchus and lung: Secondary | ICD-10-CM

## 2017-07-29 NOTE — Progress Notes (Signed)
Patient ID: Victoria Hanson, female   DOB: [birth year], 67 y.o.   MRN: [id number]   Subjective:    Patient ID: Victoria Hanson, female    DOB: 07-17-1950, 67 y.o.   MRN: [id number]  HPI  Patient here for a scheduled follow up.  She has been seeing neurology for parkinsons.  Now taking sinemet 1 1/2 tablet tid. Feels doing better on this dose.  Also taking trazodone to help with sleep.  They just increased to '100mg'$  q hs.  Tries to stay active.  No chest pain.  Breathing stable.  No acid reflux.  No abdominal pain.  Bowels moving.  Some constipation at times, but she reports she intermittently will take something for her bowels - to not have diarrhea.  No diarrhea.  She takes in anticipation.  Discussed not taking something just to try to prevent.  Feel this is leading to her constipation.  Had colonoscopy 06/14/17.  Takes xanax prn.  Overall she feels she is doing relatively well.     Past Medical History:  Diagnosis Date  . Anxiety   . Asthma   . Atypical chest pain    a. 2003 Cath: reportedly nl per pt [name]);  b. 04/2015 [place] Admission, neg troponin.  . Colon cancer ([place])    a. 1999: Pt had partial colectomy. Did develop lung metastasis. Had right upper lobectomy in 01/2007 then had associated chemotherapy.  Marland Kitchen COPD (chronic obstructive pulmonary disease) ([place])   . DDD (degenerative disc disease), lumbosacral   . Depression   . Echocardiogram abnormal    a. 02/2010 Echo: EF 55-60%, mild LAE, no regional wall motion abnormalities  . Elevated transaminase level    a. ? NASH;  b. 06/2015 - nl LFTs.  . GERD (gastroesophageal reflux disease)   . Hematuria   . Holter monitor, abnormal    a. 12/2009 Holter monitoring: NSR, rare PACs and PVCs.  Marland Kitchen Hypercholesterolemia   . Hyperlipidemia   . Hypertension   . Leaky heart valve    a. Per pt report - no evidence of valvular abnormalities on prior echoes.  . Lung cancer ([place])    a. S/P resection (lobectomy - right)  . Morbid obesity ([place])   .  Nephrolithiasis   . Parkinson disease ([place]) 08/2016   [name]   Past Surgical History:  Procedure Laterality Date  . BREAST EXCISIONAL BIOPSY Right 10/07/2009   benign  . CARDIAC CATHETERIZATION  2003   negative as per pt report  . CHOLECYSTECTOMY  2000  . COLON SURGERY     Colon cancer, removed part of large colon  . COLONOSCOPY N/A 04/25/2015   Procedure: COLONOSCOPY;  Surgeon: Manya Silvas, MD;  Location: [facility] ENDOSCOPY;  Service: Endoscopy;  Laterality: N/A;  . COLONOSCOPY WITH PROPOFOL N/A 06/14/2017   Procedure: COLONOSCOPY WITH PROPOFOL;  Surgeon: Manya Silvas, MD;  Location: [facility] ENDOSCOPY;  Service: Endoscopy;  Laterality: N/A;  . LOBECTOMY     Right lung, upper lobe right side removed  . TOTAL ABDOMINAL HYSTERECTOMY     abnormal bleeding  . TUBAL LIGATION  1980   Family History  Problem Relation Age of Onset  . Coronary artery disease Mother        died @ 6 of MI.  . Mental illness Mother   . Diabetes Mother   . Heart attack Father 38       MI  . Cancer Father        lung - died in his  mid-70's  . Hypertension Father   . Diabetes Father   . Sudden death Brother   . Mental illness Brother   . Heart attack Brother        MI @ 44, died @ age 9.  . Breast cancer Neg Hx    Social History   Socioeconomic History  . Marital status: Widowed    Spouse name: None  . Number of children: None  . Years of education: None  . Highest education level: None  Social Needs  . Financial resource strain: None  . Food insecurity - worry: None  . Food insecurity - inability: None  . Transportation needs - medical: None  . Transportation needs - non-medical: None  Occupational History  . None  Tobacco Use  . Smoking status: Passive Smoke Exposure - Never Smoker  . Smokeless tobacco: Never Used  . Tobacco comment: husband and sons smoked in her home.  Substance and Sexual Activity  . Alcohol use: Yes    Alcohol/week: 0.6 oz    Types: 1 Glasses of wine per  week    Comment: rare wine cooler.  . Drug use: No  . Sexual activity: No  Other Topics Concern  . None  Social History Narrative   Widowed.   Disabled (colon/lung CA), retired.   Lives in [place] with her dtr, grand-dtr, and son.   Active.   Gets regular exercise, walks.    Outpatient Encounter Medications as of 07/29/2017  Medication Sig  . ALPRAZolam (XANAX) 0.5 MG tablet Take 1 tablet (0.5 mg total) daily as needed by mouth for anxiety.  Marland Kitchen aspirin EC 81 MG tablet Take 81 mg by mouth daily.  . carbidopa-levodopa (SINEMET IR) 25-250 MG tablet Take 1 tablet by mouth 3 (three) times daily.  . fluticasone-salmeterol (ADVAIR HFA) 115-21 MCG/ACT inhaler Inhale 2 puffs into the lungs 2 (two) times daily.  . Multiple Vitamin (MULTIVITAMIN) tablet Take 1 tablet by mouth daily.  Marland Kitchen omeprazole (PRILOSEC) 20 MG capsule TAKE 1 CAPSULE BY MOUTH EVERY DAY  . polyethylene glycol powder (GLYCOLAX/MIRALAX) powder   . potassium chloride SA (K-DUR,KLOR-CON) 20 MEQ tablet TAKE ONE TABLET BY MOUTH TWICE A DAY  . traZODone (DESYREL) 50 MG tablet Take 2 tablets (100 mg total) by mouth at bedtime.  . triamterene-hydrochlorothiazide (MAXZIDE-25) 37.5-25 MG tablet TAKE 1 TABLET BY MOUTH EVERY DAY  . triamterene-hydrochlorothiazide (MAXZIDE-25) 37.5-25 MG tablet TAKE 1 TABLET BY MOUTH EVERY DAY  . VENTOLIN HFA 108 (90 Base) MCG/ACT inhaler INHALE 2 PUFFS BY MOUTH INTO THE LUNGS EVERY 6 HOURS AS NEEDED FOR WHEEZING   No facility-administered encounter medications on file as of 07/29/2017.     Review of Systems  Constitutional: Negative for appetite change and unexpected weight change.  HENT: Negative for congestion and sinus pressure.   Respiratory: Negative for cough, chest tightness and shortness of breath.   Cardiovascular: Negative for chest pain, palpitations and leg swelling.  Gastrointestinal: Negative for abdominal pain, diarrhea, nausea and vomiting.  Genitourinary: Negative for difficulty  urinating and dysuria.  Musculoskeletal: Negative for joint swelling and myalgias.  Skin: Negative for color change and rash.  Neurological: Negative for dizziness, light-headedness and headaches.  Psychiatric/Behavioral: Negative for agitation and dysphoric mood.       Objective:     Blood pressure rechecked by me:  120/78  Physical Exam  Constitutional: She appears well-developed and well-nourished. No distress.  HENT:  Nose: Nose normal.  Mouth/Throat: Oropharynx is clear and moist.  Neck: Neck  supple. No thyromegaly present.  Cardiovascular: Normal rate and regular rhythm.  Pulmonary/Chest: Breath sounds normal. No respiratory distress. She has no wheezes.  Abdominal: Soft. Bowel sounds are normal. There is no tenderness.  Musculoskeletal: She exhibits no edema or tenderness.  Lymphadenopathy:    She has no cervical adenopathy.  Skin: No rash noted. No erythema.  Psychiatric: She has a normal mood and affect. Her behavior is normal.    BP 128/70 (BP Location: Left Arm, Patient Position: Sitting, Cuff Size: Normal)   Pulse 72   Temp 98 F (36.7 C) (Oral)   Resp 18   Wt 196 lb (88.9 kg)   SpO2 99%   BMI 33.64 kg/m  Wt Readings from Last 3 Encounters:  07/29/17 196 lb (88.9 kg)  05/29/17 193 lb 12.8 oz (87.9 kg)  04/17/17 193 lb 9.6 oz (87.8 kg)     Lab Results  Component Value Date   WBC 9.2 02/28/2017   HGB 12.0 02/28/2017   HCT 34.3 (L) 02/28/2017   PLT 266 02/28/2017   GLUCOSE 112 (H) 04/17/2017   CHOL 237 (H) 04/17/2017   TRIG 140.0 04/17/2017   HDL 60.90 04/17/2017   LDLDIRECT 163.4 12/31/2012   LDLCALC 148 (H) 04/17/2017   ALT 5 04/17/2017   AST 17 04/17/2017   NA 138 04/17/2017   K 4.4 04/17/2017   CL 102 04/17/2017   CREATININE 1.28 (H) 04/17/2017   BUN 24 (H) 04/17/2017   CO2 28 04/17/2017   TSH 1.43 01/14/2017   INR 1.0 12/12/2012   HGBA1C 6.1 04/17/2017       Assessment & Plan:   Problem List Items Addressed This Visit    Acid  reflux    Controlled on omeprazole.        Relevant Medications   polyethylene glycol powder (GLYCOLAX/MIRALAX) powder   Anxiety, mild    Takes xanax prn.  Follow.        BMI 33.0-33.9,adult    Discussed diet and exercise.  Follow.       Chronic obstructive pulmonary disease (HCC)    Evaluated by pulmonary.  Breathing stable.        CKD (chronic kidney disease), stage III (HCC)    Previous renal ultrasound - no significant abnormality.  Follow metabolic panel.        Depression, major, recurrent ([place])    Stable.  Doing better overall.  Follow.        History of colon cancer    Just had colonoscopy 06/14/17.  Continues f/u with GI.        History of malignant neoplasm metastatic to lung    Followed by Dr Mike Gip.        Hypercholesterolemia    Low cholesterol diet and exercise.  Follow lipid panel        Relevant Orders   Hepatic function panel   Lipid panel   Hyperglycemia    Low carb diet and exercise.  Follow met b and a1c.        Relevant Orders   Hemoglobin A1c   Hypertension    Blood pressure under good control.  Continue same medication regimen.  Follow pressures.  Follow metabolic panel.        Relevant Orders   Basic metabolic panel   Major depressive disorder with single episode    Stable.  Uses xanax prn.        Parkinson's disease ([place])    On sinemet.  Doing better.  Followed by neurology.  Einar Pheasant, MD

## 2017-07-31 ENCOUNTER — Encounter: Payer: Self-pay | Admitting: Internal Medicine

## 2017-07-31 DIAGNOSIS — J449 Chronic obstructive pulmonary disease, unspecified: Secondary | ICD-10-CM | POA: Insufficient documentation

## 2017-07-31 DIAGNOSIS — G903 Multi-system degeneration of the autonomic nervous system: Secondary | ICD-10-CM | POA: Insufficient documentation

## 2017-07-31 DIAGNOSIS — G20 Parkinson's disease: Secondary | ICD-10-CM | POA: Insufficient documentation

## 2017-07-31 DIAGNOSIS — J441 Chronic obstructive pulmonary disease with (acute) exacerbation: Secondary | ICD-10-CM | POA: Insufficient documentation

## 2017-07-31 NOTE — Assessment & Plan Note (Signed)
Low cholesterol diet and exercise.  Follow lipid panel.   

## 2017-07-31 NOTE — Assessment & Plan Note (Signed)
Stable.  Doing better overall.  Follow.

## 2017-07-31 NOTE — Assessment & Plan Note (Signed)
Previous renal ultrasound - no significant abnormality.  Follow metabolic panel.

## 2017-07-31 NOTE — Assessment & Plan Note (Signed)
Evaluated by pulmonary.  Breathing stable.

## 2017-07-31 NOTE — Assessment & Plan Note (Signed)
Stable.  Uses xanax prn.

## 2017-07-31 NOTE — Assessment & Plan Note (Signed)
Controlled on omeprazole.   

## 2017-07-31 NOTE — Assessment & Plan Note (Signed)
On sinemet.  Doing better.  Followed by neurology.

## 2017-07-31 NOTE — Assessment & Plan Note (Signed)
Just had colonoscopy 06/14/17.  Continues f/u with GI.

## 2017-07-31 NOTE — Assessment & Plan Note (Signed)
Takes xanax prn.  Follow.

## 2017-07-31 NOTE — Assessment & Plan Note (Signed)
Followed by Dr Mike Gip.

## 2017-07-31 NOTE — Assessment & Plan Note (Signed)
Discussed diet and exercise.  Follow.  

## 2017-07-31 NOTE — Assessment & Plan Note (Signed)
Low carb diet and exercise.  Follow met b and a1c.   

## 2017-07-31 NOTE — Assessment & Plan Note (Signed)
Blood pressure under good control.  Continue same medication regimen.  Follow pressures.  Follow metabolic panel.   

## 2017-08-01 IMAGING — MG MM DIGITAL SCREENING BILAT W/ TOMO W/ CAD
8 of 13 series · 8 of 29 positions shown · non-contrast
Comparison: Previous exam(s).

CLINICAL DATA: Screening.

EXAM:
2D DIGITAL SCREENING BILATERAL MAMMOGRAM WITH CAD AND ADJUNCT TOMO

[L MLO]
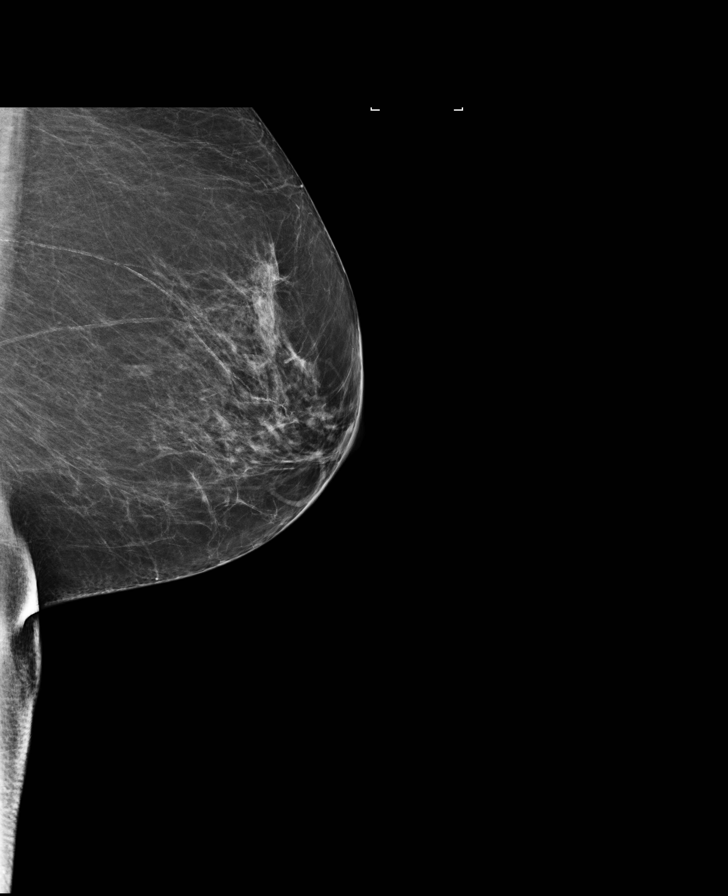

[L CC synth-2D]
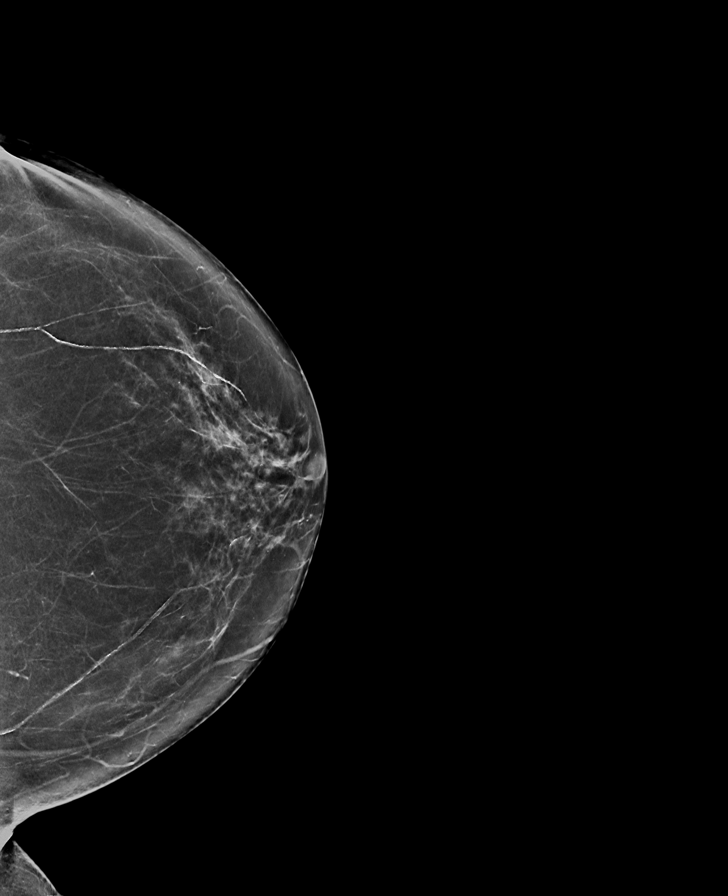

[R CC]
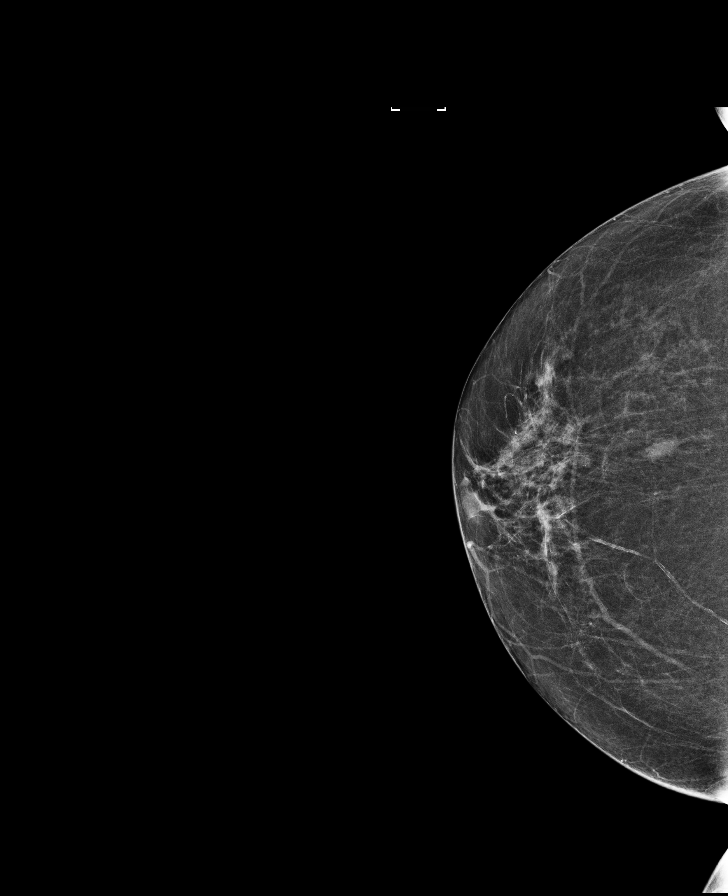

[L CC]
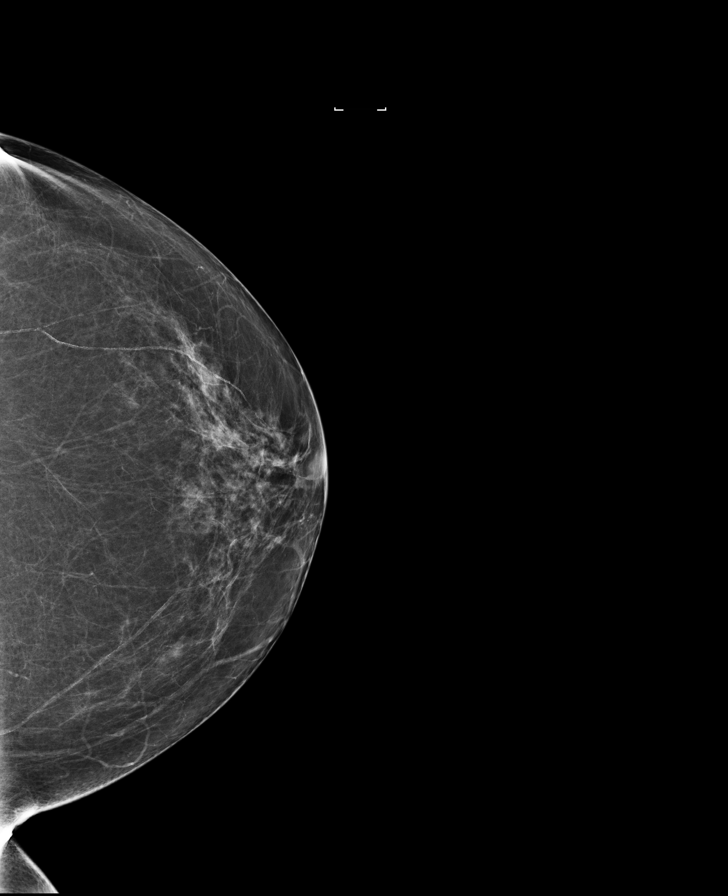

[R MLO]
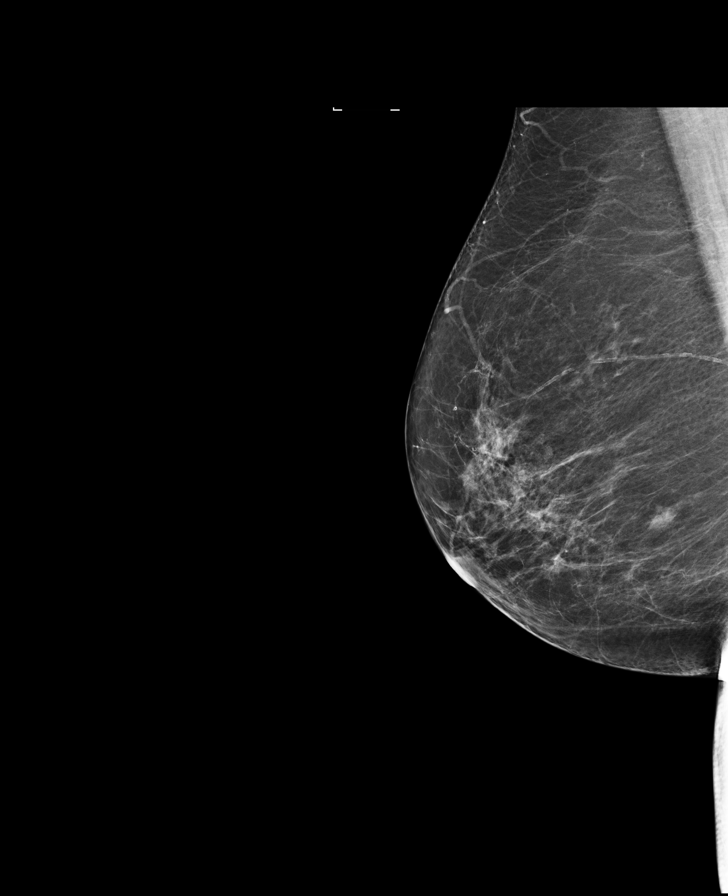

[L MLO synth-2D]
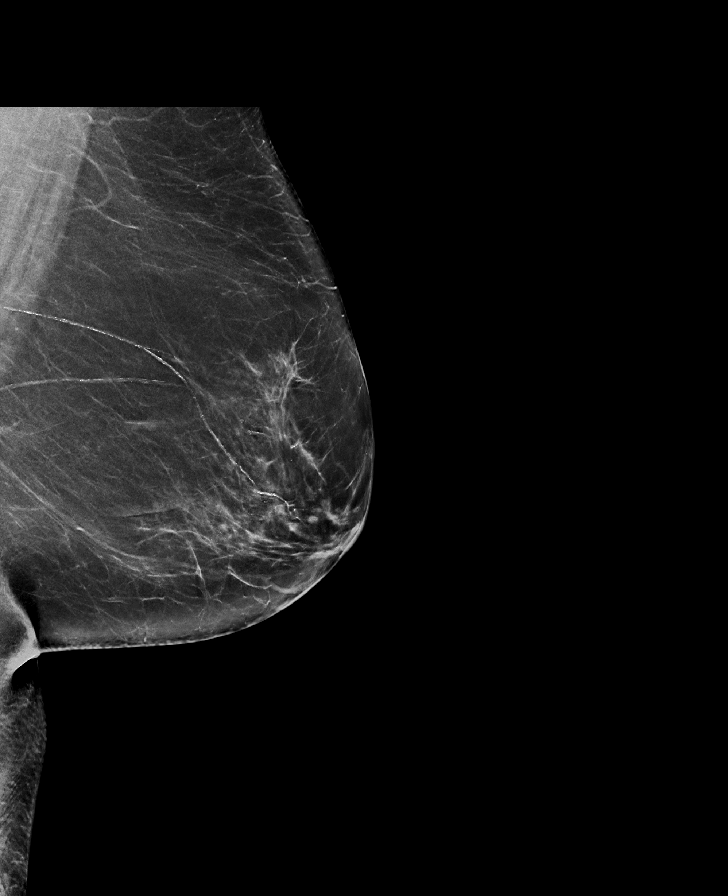

[R MLO synth-2D]
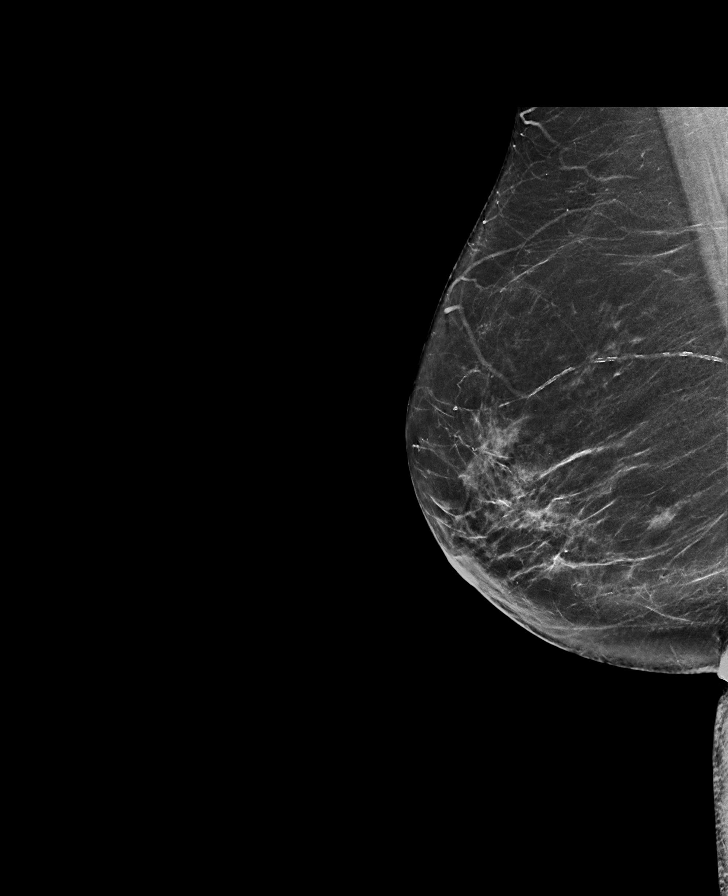

[R CC synth-2D]
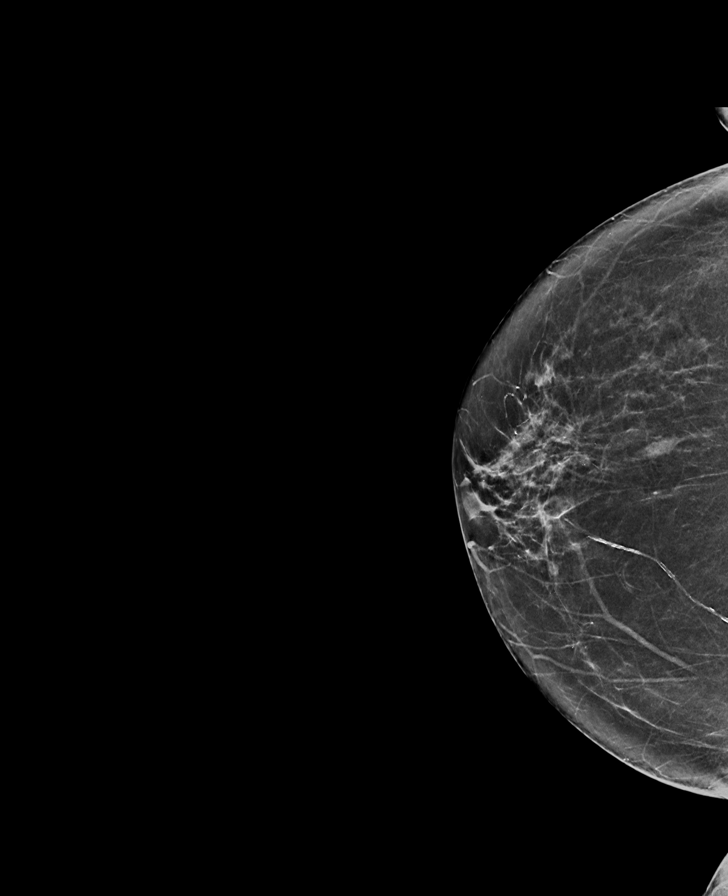

[8 of 29 positions shown; findings below may reference images not displayed]

ACR Breast Density Category b: There are scattered areas of
fibroglandular density.
FINDINGS: There are no findings suspicious for malignancy. Images were
processed with CAD.
IMPRESSION: No mammographic evidence of malignancy. A result letter of this
screening mammogram will be mailed directly to the patient.

RECOMMENDATION:
Screening mammogram in one year. (Code:97-6-RS4)

BI-RADS CATEGORY  1: Negative.

## 2017-08-07 ENCOUNTER — Other Ambulatory Visit: Payer: Self-pay | Admitting: Internal Medicine

## 2017-08-13 ENCOUNTER — Ambulatory Visit: Payer: Self-pay

## 2017-08-13 DIAGNOSIS — J019 Acute sinusitis, unspecified: Secondary | ICD-10-CM | POA: Diagnosis not present

## 2017-08-13 DIAGNOSIS — B9689 Other specified bacterial agents as the cause of diseases classified elsewhere: Secondary | ICD-10-CM | POA: Diagnosis not present

## 2017-08-13 NOTE — Telephone Encounter (Signed)
Attempted to contact pt regarding her symptoms; left message on voice mail [phone].

## 2017-08-13 NOTE — Telephone Encounter (Signed)
Message from Scherrie Gerlach sent at 08/13/2017 1:55 PM EST   Summary: home care / advice    Pt called back to advise she went to the Acute care clinic at [facility] this morning. Pt states they told her she has a bacterial sinus infection, prescribed Augmentin, a cough syrup and a nasal spray. Pt states she has gotten a couple of calls from the nurse, and she just wants you to know she was seen and got meds. Thank you for following up.

## 2017-08-15 ENCOUNTER — Ambulatory Visit: Payer: Medicare HMO | Admitting: Family Medicine

## 2017-08-22 IMAGING — CR DG LUMBAR SPINE COMPLETE 4+V
5 series · 5 of 5 positions shown · non-contrast
Comparison: 04/07/2014

CLINICAL DATA: Lower back and sacral pain.

EXAM:
LUMBAR SPINE - COMPLETE 4+ VIEW

[l-spine ap]
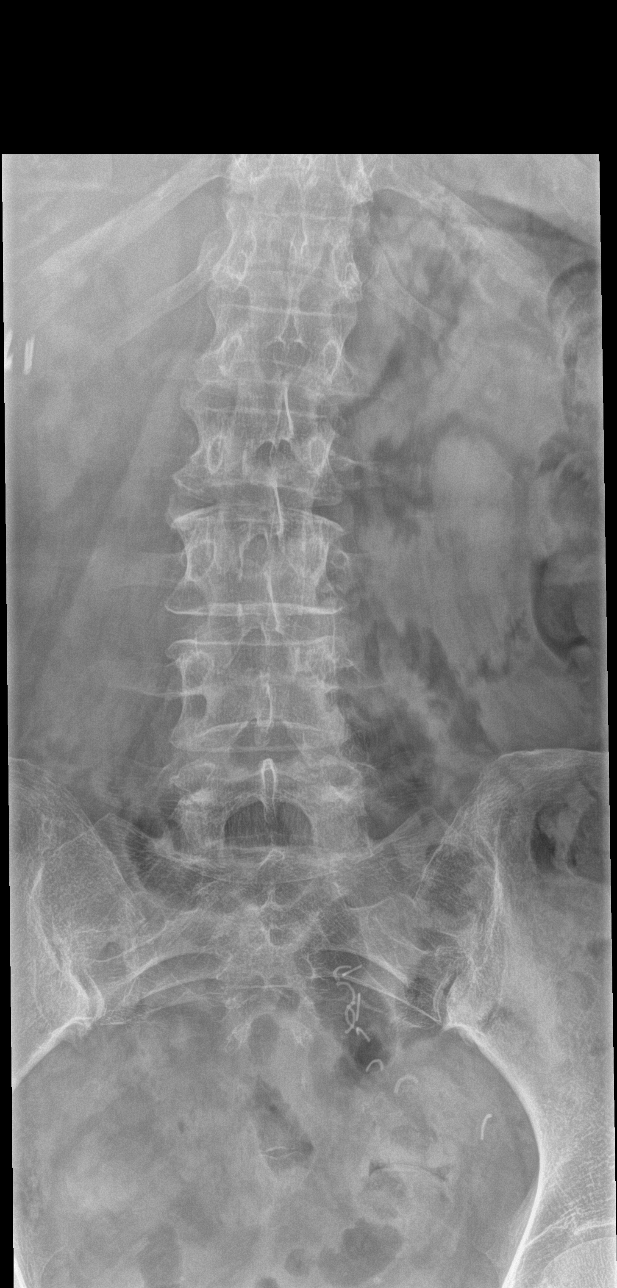

[l-spine obl (1 of 2)]
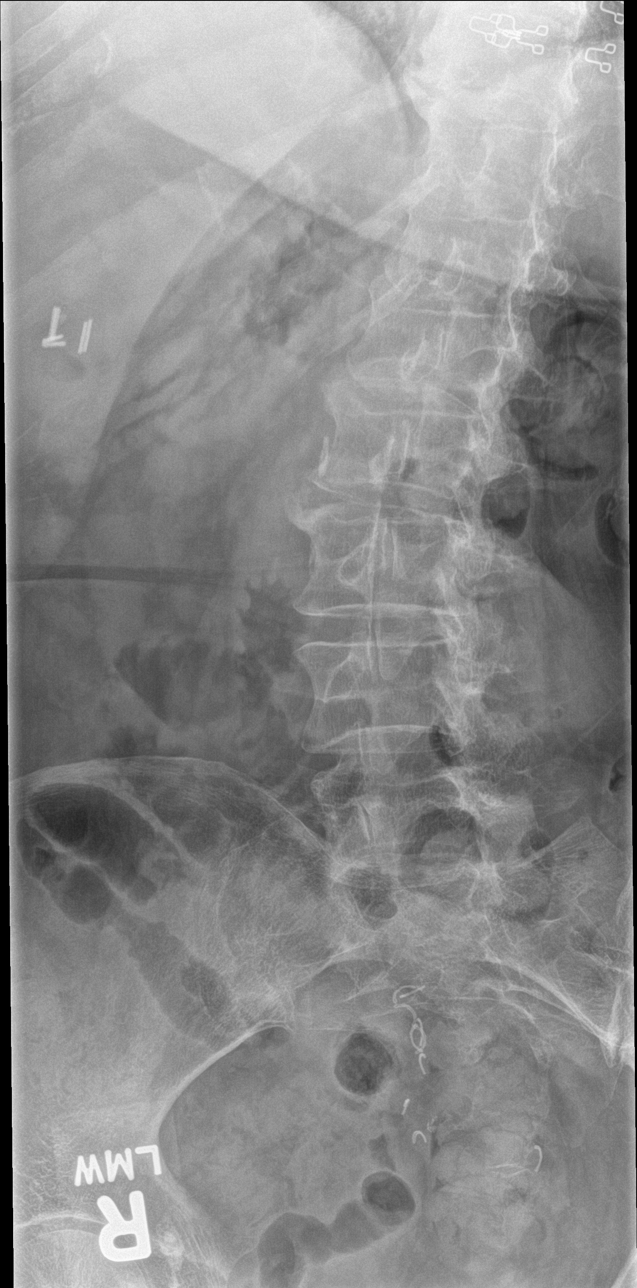

[l-spine obl (2 of 2)]
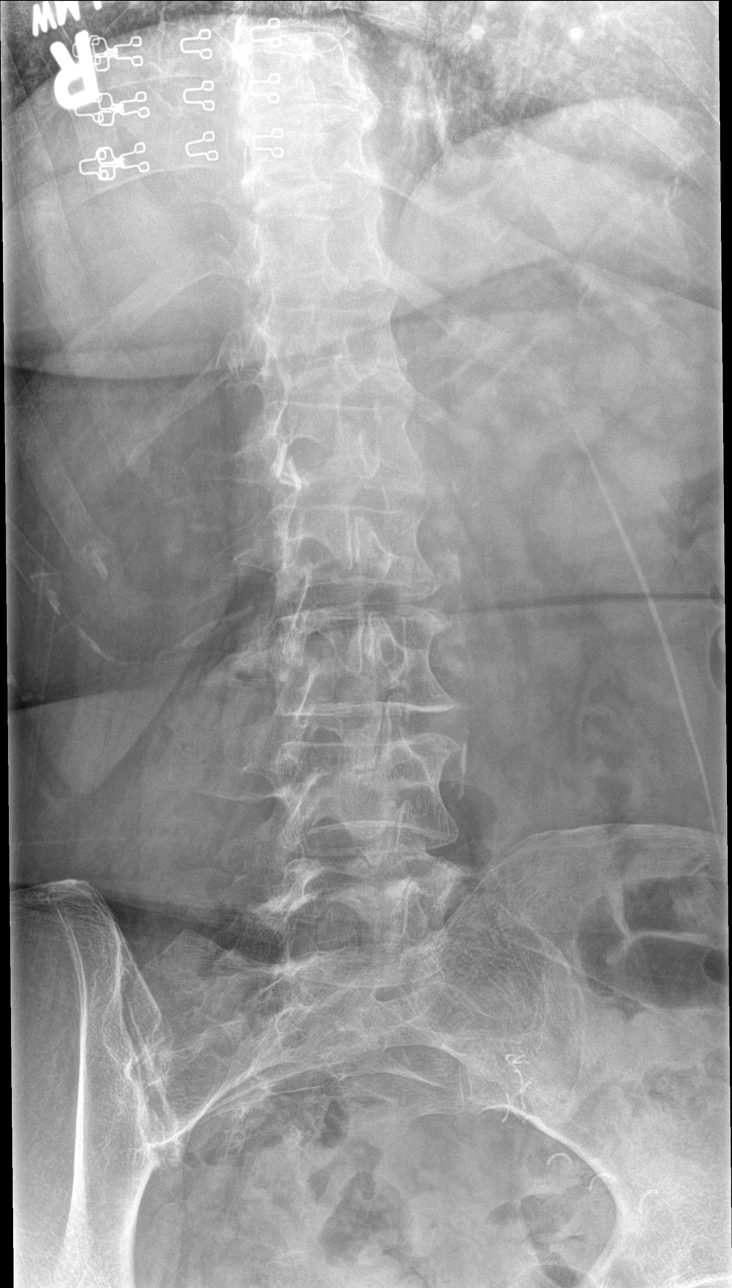

[l-spine lat]
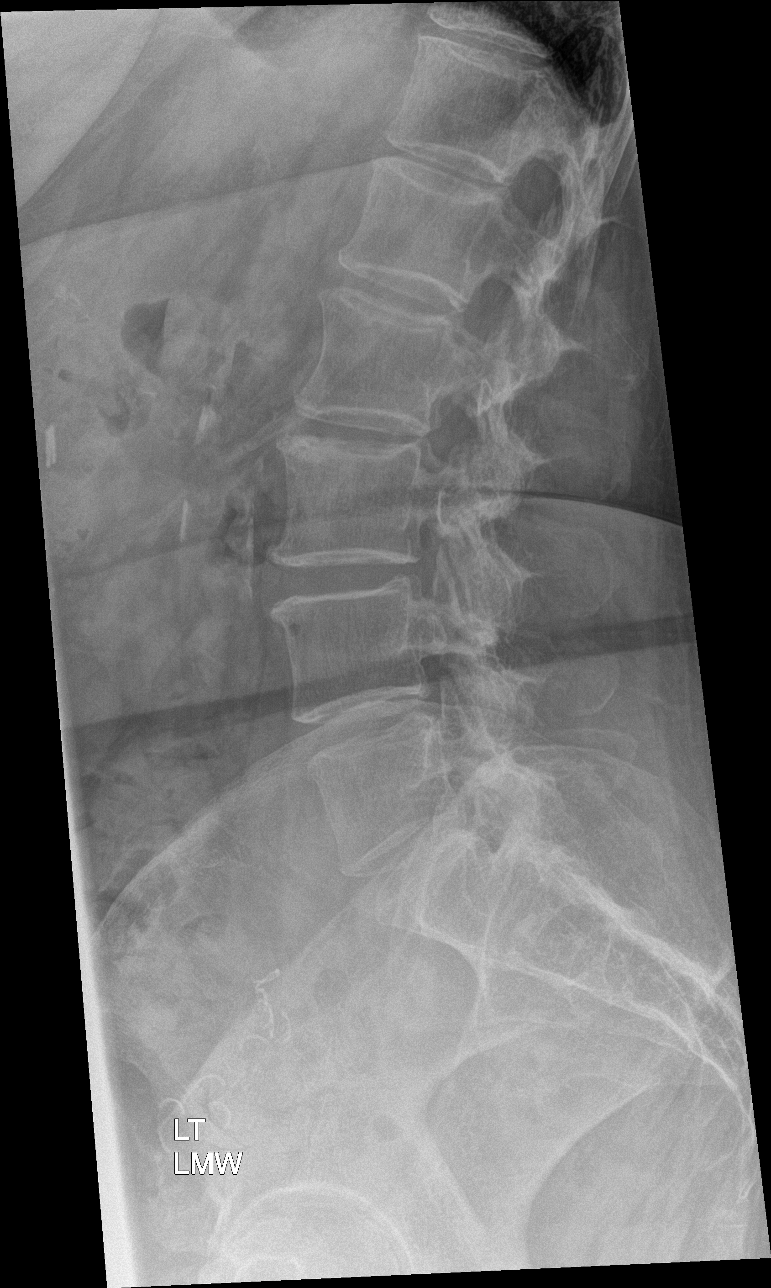

[l-spine spot]
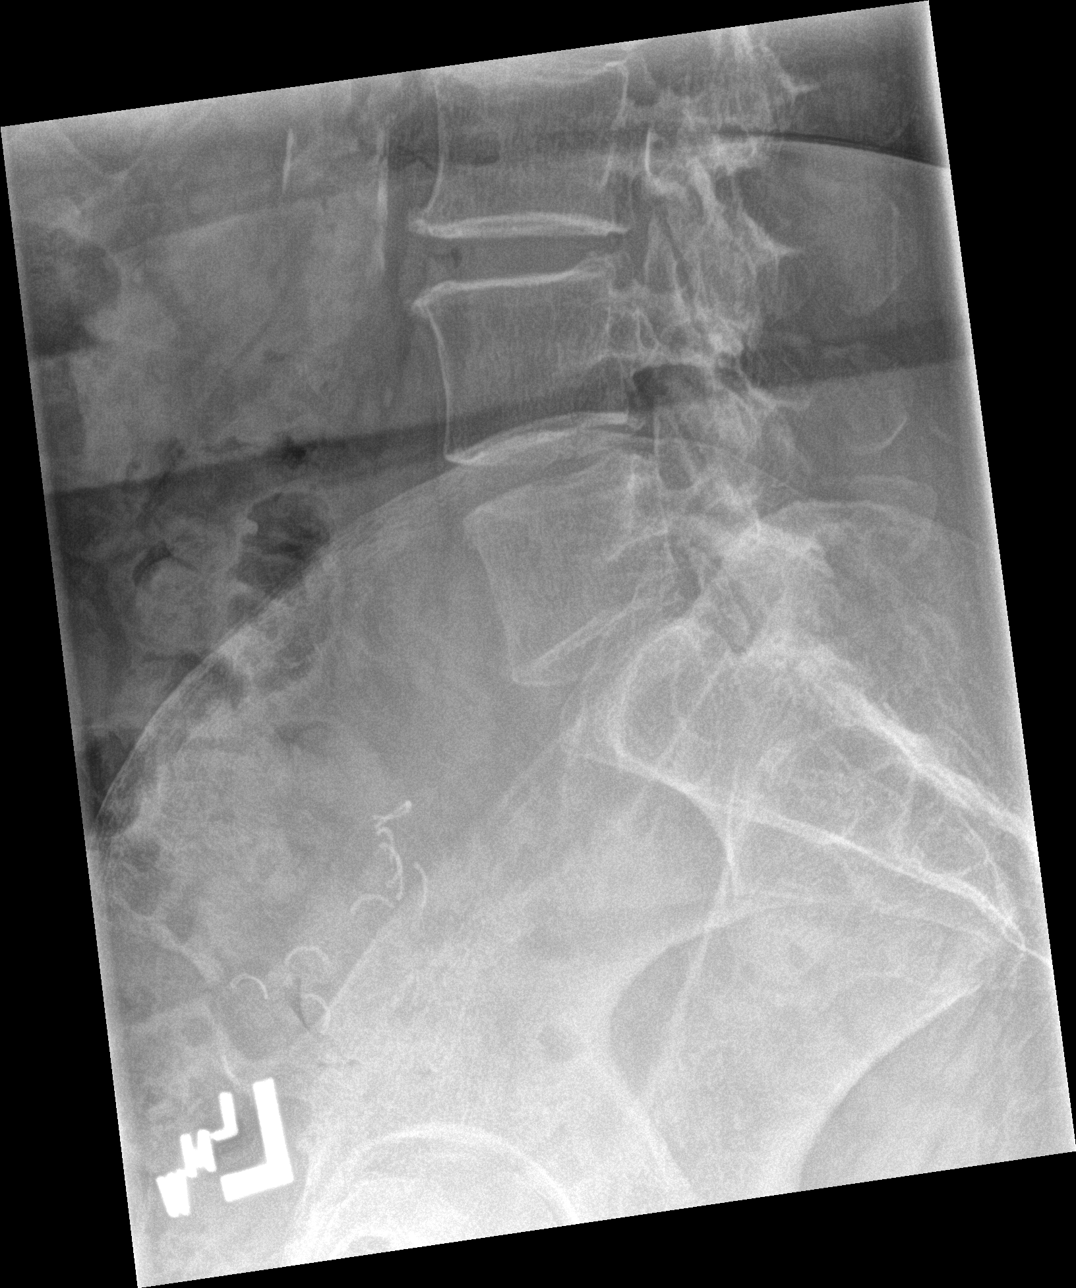

[5 of 5 positions shown; findings below may reference images not displayed]

FINDINGS: Normal segmentation. No acute fracture or traumatic malalignment. No
endplate erosion or evidence of focal bone lesion. Disc narrowing at
L2-3 with mild endplate spurring. Mild thoracic dextrocurvature.
IMPRESSION: No acute finding.  No significant change compared to 9517.

## 2017-09-02 ENCOUNTER — Other Ambulatory Visit: Payer: Self-pay | Admitting: Internal Medicine

## 2017-09-04 ENCOUNTER — Other Ambulatory Visit: Payer: Self-pay | Admitting: Internal Medicine

## 2017-09-04 ENCOUNTER — Telehealth: Payer: Self-pay

## 2017-09-04 MED ORDER — ALBUTEROL SULFATE HFA 108 (90 BASE) MCG/ACT IN AERS: INHALATION_SPRAY | RESPIRATORY_TRACT | 0 refills | Status: DC

## 2017-09-04 MED ORDER — TRIAMTERENE-HCTZ 37.5-25 MG PO TABS: 1.0000 | ORAL_TABLET | Freq: Every day | ORAL | 3 refills | Status: DC

## 2017-09-04 NOTE — Telephone Encounter (Signed)
rx refill for triamterene-hydrocholorthiazide and ventolin inhailer sent to [place].

## 2017-09-04 NOTE — Telephone Encounter (Signed)
Copied from [place]. Topic: General - Call Back - No Documentation >> Sep 04, 2017  9:53 AM Ether Griffins B wrote: Reason for CRM: pt returning Trisha's call please call back at [phone]. Pt has dentist appt at 61.

## 2017-09-04 NOTE — Telephone Encounter (Signed)
Left message to confirm taking triam/hctz

## 2017-09-05 NOTE — Telephone Encounter (Signed)
See other phone note

## 2017-09-06 ENCOUNTER — Ambulatory Visit: Payer: Medicare HMO | Admitting: Hematology and Oncology

## 2017-09-06 ENCOUNTER — Other Ambulatory Visit: Payer: Medicare HMO

## 2017-09-06 DIAGNOSIS — G479 Sleep disorder, unspecified: Secondary | ICD-10-CM | POA: Diagnosis not present

## 2017-09-06 DIAGNOSIS — R600 Localized edema: Secondary | ICD-10-CM | POA: Diagnosis not present

## 2017-09-06 DIAGNOSIS — G4752 REM sleep behavior disorder: Secondary | ICD-10-CM | POA: Diagnosis not present

## 2017-09-06 DIAGNOSIS — G20 Parkinson's disease: Secondary | ICD-10-CM | POA: Diagnosis not present

## 2017-09-13 ENCOUNTER — Other Ambulatory Visit: Payer: Self-pay | Admitting: Internal Medicine

## 2017-09-13 NOTE — Telephone Encounter (Signed)
Request for refill on Xanax Last office visit 07/31/17 PCP:  Dr. Nicki Reaper Pharmacy:  [facility]

## 2017-09-13 NOTE — Telephone Encounter (Signed)
Printed, signed and faxed.  

## 2017-09-13 NOTE — Telephone Encounter (Signed)
Pt calling about refill, says she told the pharmacy about this several days ago.

## 2017-09-13 NOTE — Telephone Encounter (Signed)
Refilled: 04/17/2017 Last OV: 07/29/2017 Next OV: 10/07/2017

## 2017-09-23 ENCOUNTER — Ambulatory Visit: Payer: Medicare HMO | Admitting: Pulmonary Disease

## 2017-09-27 ENCOUNTER — Ambulatory Visit: Payer: Medicare HMO | Admitting: Pulmonary Disease

## 2017-10-03 ENCOUNTER — Other Ambulatory Visit: Payer: Self-pay | Admitting: Internal Medicine

## 2017-10-04 ENCOUNTER — Other Ambulatory Visit: Payer: Medicare HMO

## 2017-10-04 ENCOUNTER — Ambulatory Visit: Payer: Medicare HMO | Admitting: Hematology and Oncology

## 2017-10-07 ENCOUNTER — Ambulatory Visit (INDEPENDENT_AMBULATORY_CARE_PROVIDER_SITE_OTHER): Payer: Medicare HMO | Admitting: Internal Medicine

## 2017-10-07 ENCOUNTER — Encounter: Payer: Self-pay | Admitting: Internal Medicine

## 2017-10-07 VITALS — BP 136/82 | HR 63 | Temp 97.5°F | Resp 18 | Wt 196.4 lb

## 2017-10-07 DIAGNOSIS — K219 Gastro-esophageal reflux disease without esophagitis: Secondary | ICD-10-CM | POA: Diagnosis not present

## 2017-10-07 DIAGNOSIS — F419 Anxiety disorder, unspecified: Secondary | ICD-10-CM

## 2017-10-07 DIAGNOSIS — J449 Chronic obstructive pulmonary disease, unspecified: Secondary | ICD-10-CM

## 2017-10-07 DIAGNOSIS — G20 Parkinson's disease: Secondary | ICD-10-CM

## 2017-10-07 DIAGNOSIS — R739 Hyperglycemia, unspecified: Secondary | ICD-10-CM

## 2017-10-07 DIAGNOSIS — Z6833 Body mass index (BMI) 33.0-33.9, adult: Secondary | ICD-10-CM

## 2017-10-07 DIAGNOSIS — F330 Major depressive disorder, recurrent, mild: Secondary | ICD-10-CM

## 2017-10-07 DIAGNOSIS — G20A1 Parkinson's disease without dyskinesia, without mention of fluctuations: Secondary | ICD-10-CM

## 2017-10-07 DIAGNOSIS — N649 Disorder of breast, unspecified: Secondary | ICD-10-CM

## 2017-10-07 DIAGNOSIS — E7800 Pure hypercholesterolemia, unspecified: Secondary | ICD-10-CM | POA: Diagnosis not present

## 2017-10-07 DIAGNOSIS — I10 Essential (primary) hypertension: Secondary | ICD-10-CM

## 2017-10-07 DIAGNOSIS — N183 Chronic kidney disease, stage 3 unspecified: Secondary | ICD-10-CM

## 2017-10-07 DIAGNOSIS — R351 Nocturia: Secondary | ICD-10-CM | POA: Diagnosis not present

## 2017-10-07 LAB — BASIC METABOLIC PANEL
BUN: 20 mg/dL (ref 6–23)
CALCIUM: 9.2 mg/dL (ref 8.4–10.5)
CO2: 25 meq/L (ref 19–32)
CREATININE: 1.14 mg/dL (ref 0.40–1.20)
Chloride: 105 mEq/L (ref 96–112)
GFR: 50.4 mL/min — AB (ref >60.00)
GLUCOSE: 93 mg/dL (ref 70–99)
Potassium: 4.4 mEq/L (ref 3.5–5.1)
SODIUM: 140 meq/L (ref 135–145)

## 2017-10-07 LAB — URINALYSIS, ROUTINE W REFLEX MICROSCOPIC
Bilirubin Urine: NEGATIVE
Ketones, ur: NEGATIVE
NITRITE: NEGATIVE
RBC / HPF: NONE SEEN (ref 0–?)
SPECIFIC GRAVITY, URINE: 1.01 (ref 1.000–1.030)
TOTAL PROTEIN, URINE-UPE24: NEGATIVE
URINE GLUCOSE: NEGATIVE
UROBILINOGEN UA: 0.2 (ref 0.0–1.0)
pH: 6 (ref 5.0–8.0)

## 2017-10-07 LAB — HEMOGLOBIN A1C: Hgb A1c MFr Bld: 6 % (ref 4.6–6.5)

## 2017-10-07 LAB — HEPATIC FUNCTION PANEL
ALK PHOS: 77 U/L (ref 39–117)
ALT: 1 U/L (ref 0–35)
AST: 10 U/L (ref 0–37)
Albumin: 3.9 g/dL (ref 3.5–5.2)
BILIRUBIN DIRECT: 0.1 mg/dL (ref 0.0–0.3)
Total Bilirubin: 0.4 mg/dL (ref 0.2–1.2)
Total Protein: 6.7 g/dL (ref 6.0–8.3)

## 2017-10-07 LAB — LIPID PANEL
CHOL/HDL RATIO: 4
Cholesterol: 213 mg/dL — ABNORMAL HIGH (ref 0–200)
HDL: 55.7 mg/dL (ref >39.00)
LDL Cholesterol: 130 mg/dL — ABNORMAL HIGH (ref 0–99)
NONHDL: 156.92
Triglycerides: 134 mg/dL (ref 0.0–149.0)
VLDL: 26.8 mg/dL (ref 0.0–40.0)

## 2017-10-07 MED ORDER — MUPIROCIN 2 % EX OINT: TOPICAL_OINTMENT | CUTANEOUS | 0 refills | Status: DC

## 2017-10-07 NOTE — Progress Notes (Signed)
Patient ID: Victoria Hanson, female   DOB: [birth year], 67 y.o.   MRN: [id number]   Subjective:    Patient ID: Victoria Hanson, female    DOB: Apr 21, 1951, 67 y.o.   MRN: [id number]  HPI  Patient here for a scheduled follow up.  She has a history of parkinsons and just saw neurology 09/06/17. Sinemet was increased.  Also has trouble sleeping and is on trazodone.  Feels this helps.  No chest pain.  Breathing stable.  No acid reflux reported.  No abdominal pain.  Bowels moving.  Does report some nocturia and urinary urgency.  No dysuria.  Wants urine checked to confirm no infection.  Notices some occasional dizziness - if raises up fast.  She relates this to a possible urinary tract infection. No headache.  She is concerned regarding a persistent nipple lesion.  States previously had drained a little bloody color.  No pain.  No palpable nodule.     Past Medical History:  Diagnosis Date  . Anxiety   . Asthma   . Atypical chest pain    a. 2003 Cath: reportedly nl per pt [name]);  b. 04/2015 [place] Admission, neg troponin.  . Colon cancer ([place])    a. 1999: Pt had partial colectomy. Did develop lung metastasis. Had right upper lobectomy in 01/2007 then had associated chemotherapy.  Marland Kitchen COPD (chronic obstructive pulmonary disease) ([place])   . DDD (degenerative disc disease), lumbosacral   . Depression   . Echocardiogram abnormal    a. 02/2010 Echo: EF 55-60%, mild LAE, no regional wall motion abnormalities  . Elevated transaminase level    a. ? NASH;  b. 06/2015 - nl LFTs.  . GERD (gastroesophageal reflux disease)   . Hematuria   . Holter monitor, abnormal    a. 12/2009 Holter monitoring: NSR, rare PACs and PVCs.  Marland Kitchen Hypercholesterolemia   . Hyperlipidemia   . Hypertension   . Leaky heart valve    a. Per pt report - no evidence of valvular abnormalities on prior echoes.  . Lung cancer ([place])    a. S/P resection (lobectomy - right)  . Morbid obesity ([place])   . Nephrolithiasis   . Parkinson disease  ([place]) 08/2016   [name]   Past Surgical History:  Procedure Laterality Date  . BREAST EXCISIONAL BIOPSY Right 10/07/2009   benign  . CARDIAC CATHETERIZATION  2003   negative as per pt report  . CHOLECYSTECTOMY  2000  . COLON SURGERY     Colon cancer, removed part of large colon  . COLONOSCOPY N/A 04/25/2015   Procedure: COLONOSCOPY;  Surgeon: Manya Silvas, MD;  Location: [facility] ENDOSCOPY;  Service: Endoscopy;  Laterality: N/A;  . COLONOSCOPY WITH PROPOFOL N/A 06/14/2017   Procedure: COLONOSCOPY WITH PROPOFOL;  Surgeon: Manya Silvas, MD;  Location: [facility] ENDOSCOPY;  Service: Endoscopy;  Laterality: N/A;  . LOBECTOMY     Right lung, upper lobe right side removed  . TOTAL ABDOMINAL HYSTERECTOMY     abnormal bleeding  . TUBAL LIGATION  1980   Family History  Problem Relation Age of Onset  . Coronary artery disease Mother        died @ 71 of MI.  . Mental illness Mother   . Diabetes Mother   . Heart attack Father 29       MI  . Cancer Father        lung - died in his mid-70's  . Hypertension Father   . Diabetes Father   .  Sudden death Brother   . Mental illness Brother   . Heart attack Brother        MI @ 12, died @ age 38.  . Breast cancer Neg Hx    Social History   Socioeconomic History  . Marital status: Widowed    Spouse name: Not on file  . Number of children: Not on file  . Years of education: Not on file  . Highest education level: Not on file  Occupational History  . Not on file  Social Needs  . Financial resource strain: Not on file  . Food insecurity:    Worry: Not on file    Inability: Not on file  . Transportation needs:    Medical: Not on file    Non-medical: Not on file  Tobacco Use  . Smoking status: Passive Smoke Exposure - Never Smoker  . Smokeless tobacco: Never Used  . Tobacco comment: husband and sons smoked in her home.  Substance and Sexual Activity  . Alcohol use: Yes    Alcohol/week: 0.6 oz    Types: 1 Glasses of wine per week     Comment: rare wine cooler.  . Drug use: No  . Sexual activity: Never  Lifestyle  . Physical activity:    Days per week: Not on file    Minutes per session: Not on file  . Stress: Not on file  Relationships  . Social connections:    Talks on phone: Not on file    Gets together: Not on file    Attends religious service: Not on file    Active member of club or organization: Not on file    Attends meetings of clubs or organizations: Not on file    Relationship status: Not on file  Other Topics Concern  . Not on file  Social History Narrative   Widowed.   Disabled (colon/lung CA), retired.   Lives in [place] with her dtr, grand-dtr, and son.   Active.   Gets regular exercise, walks.    Outpatient Encounter Medications as of 10/07/2017  Medication Sig  . albuterol (VENTOLIN HFA) 108 (90 Base) MCG/ACT inhaler INHALE 2 PUFFS BY MOUTH INTO THE LUNGS EVERY 6 HOURS AS NEEDED FOR WHEEZING  . ALPRAZolam (XANAX) 0.5 MG tablet TAKE 1 TABLET BY MOUTH DAILY AS NEEDED FOR ANXIETY  . aspirin EC 81 MG tablet Take 81 mg by mouth daily.  . carbidopa-levodopa (SINEMET IR) 25-100 MG tablet   . fluticasone (FLONASE) 50 MCG/ACT nasal spray   . fluticasone-salmeterol (ADVAIR HFA) 115-21 MCG/ACT inhaler Inhale 2 puffs into the lungs 2 (two) times daily.  . Multiple Vitamin (MULTIVITAMIN) tablet Take 1 tablet by mouth daily.  . mupirocin ointment (BACTROBAN) 2 % Apply to affected area on nipple bid  . omeprazole (PRILOSEC) 20 MG capsule TAKE 1 CAPSULE BY MOUTH EVERY DAY  . polyethylene glycol powder (GLYCOLAX/MIRALAX) powder   . potassium chloride SA (K-DUR,KLOR-CON) 20 MEQ tablet TAKE ONE TABLET BY MOUTH TWICE A DAY  . traZODone (DESYREL) 50 MG tablet TAKE 2 TABLETS BY MOUTH AT BEDTIME  . triamterene-hydrochlorothiazide (MAXZIDE-25) 37.5-25 MG tablet Take 1 tablet by mouth daily.  . [DISCONTINUED] carbidopa-levodopa (SINEMET IR) 25-250 MG tablet Take 1 tablet by mouth 3 (three) times daily.  .  [DISCONTINUED] triamterene-hydrochlorothiazide (MAXZIDE-25) 37.5-25 MG tablet TAKE 1 TABLET BY MOUTH EVERY DAY   No facility-administered encounter medications on file as of 10/07/2017.     Review of Systems  Constitutional: Negative for appetite change  and unexpected weight change.  HENT: Negative for congestion and sinus pressure.   Respiratory: Negative for cough, chest tightness and shortness of breath.   Cardiovascular: Negative for chest pain, palpitations and leg swelling.  Gastrointestinal: Negative for abdominal pain, diarrhea, nausea and vomiting.  Genitourinary:       Reports nocturia and urinary urgency.    Musculoskeletal: Negative for joint swelling and myalgias.  Skin: Negative for color change and rash.  Neurological: Negative for headaches.       Occasional dizziness as outlined.    Psychiatric/Behavioral: Negative for agitation and dysphoric mood.       Objective:    Physical Exam  Constitutional: She appears well-developed and well-nourished. No distress.  HENT:  Nose: Nose normal.  Mouth/Throat: Oropharynx is clear and moist.  Neck: Neck supple. No thyromegaly present.  Cardiovascular: Normal rate and regular rhythm.  Breast exam:  Right nipple - scabbed lesion - nipple.  No palpable nodule or axillary adenopathy.  Left - no nipple discharge or nipple retraction present.  Could not appreciate any axillary adenopathy.    Pulmonary/Chest: Breath sounds normal. No respiratory distress. She has no wheezes.  Abdominal: Soft. Bowel sounds are normal. There is no tenderness.  Musculoskeletal: She exhibits no edema or tenderness.  Lymphadenopathy:    She has no cervical adenopathy.  Skin: No rash noted. No erythema.  Psychiatric: She has a normal mood and affect. Her behavior is normal.    BP 136/82 (BP Location: Left Arm, Patient Position: Sitting, Cuff Size: Large)   Pulse 63   Temp (!) 97.5 F (36.4 C) (Oral)   Resp 18   Wt 196 lb 6.4 oz (89.1 kg)   SpO2  96%   BMI 33.71 kg/m  Wt Readings from Last 3 Encounters:  10/08/17 196 lb (88.9 kg)  10/07/17 196 lb 6.4 oz (89.1 kg)  07/29/17 196 lb (88.9 kg)     Lab Results  Component Value Date   WBC 9.2 02/28/2017   HGB 12.0 02/28/2017   HCT 34.3 (L) 02/28/2017   PLT 266 02/28/2017   GLUCOSE 93 10/07/2017   CHOL 213 (H) 10/07/2017   TRIG 134.0 10/07/2017   HDL 55.70 10/07/2017   LDLDIRECT 163.4 12/31/2012   LDLCALC 130 (H) 10/07/2017   ALT 1 10/07/2017   AST 10 10/07/2017   NA 140 10/07/2017   K 4.4 10/07/2017   CL 105 10/07/2017   CREATININE 1.14 10/07/2017   BUN 20 10/07/2017   CO2 25 10/07/2017   TSH 1.43 01/14/2017   INR 1.0 12/12/2012   HGBA1C 6.0 10/07/2017       Assessment & Plan:   Problem List Items Addressed This Visit    Anxiety, mild    Takes xanax prn. Feels stable.       BMI 33.0-33.9,adult    Diet and exercise.  Follow.       Chronic obstructive pulmonary disease ([place])    Being followed by pulmonary.  Breathing stable.        Relevant Medications   fluticasone (FLONASE) 50 MCG/ACT nasal spray   CKD (chronic kidney disease), stage III (HCC)    Follow metabolic panel.  Stable.        Depression, major, recurrent ([place])    Doing better.  Follow.       GERD (gastroesophageal reflux disease)    Controlled on current regimen.       Hypercholesterolemia    Low cholesterol diet and exercise.  On crestor.  Hyperglycemia    Low carb diet and exercise.  Follow met b and a1c.        Hypertension    Blood pressure under good control.  Continue same medication regimen.  Follow pressures.  Follow metabolic panel.        Parkinson's disease ([place])    Followed by neurology.  On sinemet.  Dose just increased.        Relevant Medications   carbidopa-levodopa (SINEMET IR) 25-100 MG tablet    Other Visit Diagnoses    Nocturia    -  Primary   Relevant Orders   Urinalysis, Routine w reflex microscopic (Completed)   Urine Culture (Completed)     Nipple lesion       Persistent.  given history and previous note of some bloodydrainage, will have surgery evaluate.     Relevant Orders   Ambulatory referral to General Surgery       Einar Pheasant, MD

## 2017-10-08 ENCOUNTER — Ambulatory Visit (INDEPENDENT_AMBULATORY_CARE_PROVIDER_SITE_OTHER): Payer: Medicare HMO | Admitting: Pulmonary Disease

## 2017-10-08 ENCOUNTER — Other Ambulatory Visit: Payer: Self-pay

## 2017-10-08 ENCOUNTER — Encounter: Payer: Self-pay | Admitting: Pulmonary Disease

## 2017-10-08 VITALS — BP 122/82 | HR 58 | Ht 64.0 in | Wt 196.0 lb

## 2017-10-08 DIAGNOSIS — R0609 Other forms of dyspnea: Secondary | ICD-10-CM

## 2017-10-08 DIAGNOSIS — J449 Chronic obstructive pulmonary disease, unspecified: Secondary | ICD-10-CM | POA: Diagnosis not present

## 2017-10-08 LAB — URINE CULTURE
MICRO NUMBER: 90518843
RESULT: NO GROWTH
SPECIMEN QUALITY: ADEQUATE

## 2017-10-08 MED ORDER — ROSUVASTATIN CALCIUM 10 MG PO TABS: 10.0000 mg | ORAL_TABLET | Freq: Every day | ORAL | 1 refills | Status: DC

## 2017-10-08 NOTE — Patient Instructions (Addendum)
We have provided a sample of Breo inhaler You may use this sample and decide whether you prefer this or Advair HFA If you prefer the Breo, call our office and we will prescribe it for you If you prefer the Advair HFA, resume that medication You need to use either Breo or Advair on a regular basis (every day as prescribed) Continue albuterol inhaler (gray inhaler) as needed for increased shortness of breath, wheezing, cough, chest tightness You may use the albuterol inhaler prior to exercise or exertion Follow-up in 4 to 6 weeks with chest x-ray and lung function tests prior to that visit.   If you are not significantly better at that time, we will consider further evaluation of your heart

## 2017-10-08 NOTE — Progress Notes (Signed)
PULMONARY OFFICE FOLLOW UP NOTE  Requesting MD/Service: Eliezer Lofts, NP Date of initial consultation: 07/06/16 Reason for consultation: COPD  PT PROFILE: 67 y.o.F never smoker but with significant second hand smoke exposure and prior employment in [employer]. S/P RUL resection in 2008 for lung cancer. Carries diagnosis of COPD. Referred for eval of class II-III dyspnea.  DATA: Echocardiogram 06/21/15: normal LVEF, mildly dilated LA. RVSP est 37 mmHg CT chest 03/10/16: No evidence of pulmonary embolism. No acute findings in the thorax to account for the patient's symptoms. Status post right upper lobectomy PFT 08/07/16: FVC:  2.43L (87 %pred), FEV1:  1.61L (79 %pred), FEV1/FVC: 66% , TLC: 4.68 L (101 %pred), DLCO 61 %pred, DLCO/VA 110%  INTERVAL:  Last visit 12/31/2016.  No major events since that time.  SUBJ:  This is a routine reevaluation.  She does report that she feels that she has gradually worsening exertional dyspnea.  She is not using her Advair inhaler on a regular basis.  She states that she uses it only when she "feels like she needs it".  Is not really using the albuterol rescue inhaler at all.  Problem with the Advair inhaler is that it causes dysphonia.  She denies exertional chest pain, orthopnea, paroxysmal nocturnal dyspnea, lower extremity edema.  She has no cough or hemoptysis.   Vitals:   10/08/17 0958 10/08/17 1000  BP:  122/82  Pulse:  (!) 58  SpO2:  98%  Weight: 196 lb (88.9 kg)   Height: 5\' 4"  (1.626 m)   Room air   EXAM:  Gen: Moderately obese, NAD HEENT: NCAT, sclera white Neck: No JVD Lungs: breath sounds full, no wheezes or other adventitious sounds Cardiovascular: RRR, no murmurs Abdomen: Soft, nontender, normal BS Ext: without clubbing, cyanosis, edema Neuro: grossly intact Skin: Limited exam, no lesions noted    DATA:   BMP Latest Ref Rng & Units 10/07/2017 04/17/2017 02/28/2017  Glucose 70 - 99 mg/dL 93 112(H) 107(H)  BUN 6 - 23 mg/dL 20  24(H) 20  Creatinine 0.40 - 1.20 mg/dL 1.14 1.28(H) 1.40(H)  Sodium 135 - 145 mEq/L 140 138 135  Potassium 3.5 - 5.1 mEq/L 4.4 4.4 4.2  Chloride 96 - 112 mEq/L 105 102 102  CO2 19 - 32 mEq/L 25 28 25   Calcium 8.4 - 10.5 mg/dL 9.2 9.8 9.4    CBC Latest Ref Rng & Units 02/28/2017 09/24/2016 08/23/2016  WBC 3.6 - 11.0 K/uL 9.2 8.9 8.8  Hemoglobin 12.0 - 16.0 g/dL 12.0 12.3 12.3  Hematocrit 35.0 - 47.0 % 34.3(L) 36.6 36.1  Platelets 150 - 440 K/uL 266 245 259    CXR: No new film  IMPRESSION:     ICD-10-CM   1. Chronic obstructive asthma ([place]) J44.9 DG Chest 2 View    Pulmonary Function Test [facility] Only  2. Worsening DOE R06.09 DG Chest 2 View    Pulmonary Function Test [facility] Only   I emphasized the importance that she should remain on a regularly scheduled ICS/LABA.  I do not care whether this is Memory Dance (which she was on previously) or Advair.  She believes the Advair has caused more dysphonia.  This is somewhat surprising as more people tend to have problems with the dry powder inhalers.  Nonetheless, we provided her with a sample of Breo to see if she tolerates this better.  PLAN:  Sample of Breo provided. She is to used this for 1 week and decide whether she prefers Breo or Advair HFA.  If  she prefers Breo, she is to call us and we will prescribe it.  If she prefers Advair HFA, she is to resume this medication on a scheduled basis as prescribed.  I instructed her to increase her use of albuterol as needed for increased shortness of breath, wheezing, cough, chest tightness  I also instructed that she may use albuterol inhaler prior to exercise or exertion  She will follow-up in 4 to 6 weeks with CXR and PFTs prior to that visit  If she is not significantly improved at that time, and depending on the results of PFTs, we will consider whether further evaluation of dyspnea (which might include a cardiac evaluation) is warranted   Merton Border, MD PCCM service Mobile [phone] Pager  [phone] 10/08/2017 1:07 PM

## 2017-10-09 ENCOUNTER — Telehealth: Payer: Self-pay

## 2017-10-09 NOTE — Telephone Encounter (Signed)
Copied from [place]. Topic: Referral - Status >> Oct 09, 2017  1:43 PM Ivar Drape wrote: Reason for CRM:   Patient is requesting the status on a referral for a surgeon in [place].  She would like it set up for the middle of May.

## 2017-10-10 ENCOUNTER — Encounter: Payer: Self-pay | Admitting: Internal Medicine

## 2017-10-10 NOTE — Assessment & Plan Note (Signed)
Diet and exercise.  Follow.  

## 2017-10-10 NOTE — Assessment & Plan Note (Signed)
Being followed by pulmonary.  Breathing stable.

## 2017-10-10 NOTE — Assessment & Plan Note (Signed)
Doing better.  Follow.   

## 2017-10-10 NOTE — Assessment & Plan Note (Signed)
Followed by neurology.  On sinemet.  Dose just increased.

## 2017-10-10 NOTE — Assessment & Plan Note (Signed)
Low cholesterol diet and exercise.  On crestor.

## 2017-10-10 NOTE — Assessment & Plan Note (Signed)
Takes xanax prn. Feels stable.

## 2017-10-10 NOTE — Assessment & Plan Note (Signed)
Follow metabolic panel.  Stable.

## 2017-10-10 NOTE — Assessment & Plan Note (Signed)
Blood pressure under good control.  Continue same medication regimen.  Follow pressures.  Follow metabolic panel.   

## 2017-10-10 NOTE — Assessment & Plan Note (Signed)
Controlled on current regimen.   

## 2017-10-10 NOTE — Assessment & Plan Note (Signed)
Low carb diet and exercise.  Follow met b and a1c.   

## 2017-10-15 ENCOUNTER — Inpatient Hospital Stay (HOSPITAL_BASED_OUTPATIENT_CLINIC_OR_DEPARTMENT_OTHER): Payer: Medicare HMO | Admitting: Hematology and Oncology

## 2017-10-15 ENCOUNTER — Inpatient Hospital Stay: Payer: Medicare HMO | Attending: Hematology and Oncology

## 2017-10-15 ENCOUNTER — Telehealth: Payer: Self-pay

## 2017-10-15 ENCOUNTER — Encounter: Payer: Self-pay | Admitting: Hematology and Oncology

## 2017-10-15 ENCOUNTER — Other Ambulatory Visit: Payer: Self-pay

## 2017-10-15 VITALS — BP 138/83 | HR 65 | Temp 97.8°F | Resp 18 | Wt 195.2 lb

## 2017-10-15 DIAGNOSIS — Z85038 Personal history of other malignant neoplasm of large intestine: Secondary | ICD-10-CM

## 2017-10-15 DIAGNOSIS — R350 Frequency of micturition: Secondary | ICD-10-CM

## 2017-10-15 DIAGNOSIS — R3914 Feeling of incomplete bladder emptying: Secondary | ICD-10-CM | POA: Diagnosis not present

## 2017-10-15 DIAGNOSIS — Z85118 Personal history of other malignant neoplasm of bronchus and lung: Secondary | ICD-10-CM

## 2017-10-15 DIAGNOSIS — Z7189 Other specified counseling: Secondary | ICD-10-CM | POA: Insufficient documentation

## 2017-10-15 DIAGNOSIS — C189 Malignant neoplasm of colon, unspecified: Secondary | ICD-10-CM

## 2017-10-15 DIAGNOSIS — N6452 Nipple discharge: Secondary | ICD-10-CM

## 2017-10-15 DIAGNOSIS — Z8589 Personal history of malignant neoplasm of other organs and systems: Secondary | ICD-10-CM

## 2017-10-15 LAB — CBC WITH DIFFERENTIAL/PLATELET
Basophils Absolute: 0.1 10*3/uL (ref 0–0.1)
Basophils Relative: 1 %
Eosinophils Absolute: 0 10*3/uL (ref 0–0.7)
Eosinophils Relative: 1 %
HCT: 34.5 % — ABNORMAL LOW (ref 35.0–47.0)
Hemoglobin: 11.8 g/dL — ABNORMAL LOW (ref 12.0–16.0)
Lymphocytes Relative: 18 %
Lymphs Abs: 1.3 10*3/uL (ref 1.0–3.6)
MCH: 29 pg (ref 26.0–34.0)
MCHC: 34.2 g/dL (ref 32.0–36.0)
MCV: 84.7 fL (ref 80.0–100.0)
Monocytes Absolute: 0.4 10*3/uL (ref 0.2–0.9)
Monocytes Relative: 5 %
Neutro Abs: 5.5 10*3/uL (ref 1.4–6.5)
Neutrophils Relative %: 75 %
Platelets: 224 10*3/uL (ref 150–440)
RBC: 4.07 MIL/uL (ref 3.80–5.20)
RDW: 13.6 % (ref 11.5–14.5)
WBC: 7.3 10*3/uL (ref 3.6–11.0)

## 2017-10-15 LAB — URINALYSIS, COMPLETE (UACMP) WITH MICROSCOPIC
Bacteria, UA: NONE SEEN
Bilirubin Urine: NEGATIVE
GLUCOSE, UA: NEGATIVE mg/dL
KETONES UR: NEGATIVE mg/dL
LEUKOCYTES UA: NEGATIVE
Nitrite: NEGATIVE
PH: 6 (ref 5.0–8.0)
PROTEIN: NEGATIVE mg/dL
SQUAMOUS EPITHELIAL / LPF: NONE SEEN (ref 0–5)
Specific Gravity, Urine: 1.005 (ref 1.005–1.030)
WBC, UA: NONE SEEN WBC/hpf (ref 0–5)

## 2017-10-15 LAB — COMPREHENSIVE METABOLIC PANEL
ALT: <5 U/L — ABNORMAL LOW (ref 14–54)
AST: 16 U/L (ref 15–41)
Albumin: 3.7 g/dL (ref 3.5–5.0)
Alkaline Phosphatase: 77 U/L (ref 38–126)
Anion gap: 9 (ref 5–15)
BUN: 23 mg/dL — ABNORMAL HIGH (ref 6–20)
CO2: 23 mmol/L (ref 22–32)
Calcium: 9.2 mg/dL (ref 8.9–10.3)
Chloride: 104 mmol/L (ref 101–111)
Creatinine, Ser: 1.16 mg/dL — ABNORMAL HIGH (ref 0.44–1.00)
GFR calc Af Amer: 55 mL/min — ABNORMAL LOW (ref >60)
GFR calc non Af Amer: 48 mL/min — ABNORMAL LOW (ref >60)
Glucose, Bld: 110 mg/dL — ABNORMAL HIGH (ref 65–99)
Potassium: 4.3 mmol/L (ref 3.5–5.1)
Sodium: 136 mmol/L (ref 135–145)
Total Bilirubin: 0.5 mg/dL (ref 0.3–1.2)
Total Protein: 7 g/dL (ref 6.5–8.1)

## 2017-10-15 NOTE — Telephone Encounter (Signed)
Spoke with patient again and went over labs with her.

## 2017-10-15 NOTE — Progress Notes (Signed)
Patient here today for follow up regarding colon cancer.  She states she has a pin hole in her right nipple.  Dr. Nicki Reaper @ [place] saw patient several months ago but just recently started using the prescription cream that was prescribed.  She wants Dr. Loletha Grayer to look at it today.

## 2017-10-15 NOTE — Telephone Encounter (Signed)
Copied from [place] [phone]. Topic: Inquiry >> Oct 15, 2017  7:55 AM Pricilla Handler wrote: Reason for CRM: Patient called wanting to speak with Dr. Nicki Reaper or her assistant. Patient thinks that she received the wrong lab results back. Patient also stated that she should not be on Crestor, because she had cancer, and it increases her liver enzymes. Patient says that Dr. Nicki Reaper was only checking for her UTI during her last visit. Patient states that she does not know what is going on? Please call patient this morning at [phone] or her cell [phone].        Thank You!!!  >> Oct 15, 2017  8:03 AM Pricilla Handler wrote: Patient stating that she is urinating a lot also.Marland KitchenMarland Kitchen

## 2017-10-15 NOTE — Progress Notes (Signed)
[facility] day:  10/15/2017  Chief Complaint: Victoria Hanson is a 67 y.o. female with metastatic colon cancer who is seen for 7 month assessment.  HPI:  The patient was last seen in the medical oncology clinic on 03/08/2017.  At that time, she had no further rectal bleeding.   Etiology was felt secondary to a rectal fissure.  Her breathing had improved overall.  Exam was stable.  Chest CT revealed post surgical changes.  She was to follow-up with GI.  She underwent colonoscopy on 06/14/2017 by Dr. Vira Agar.  There were 2 diminutive polyp in the transverse colon and internal hemorrhoids.  Pathology confirmed hyperplastic polyps.  Repeat colonoscopy was planned in 5 years.  During the interim, patient is doing well. Patient denies bleeding; no hematochezia, melena, or gross hematuria. Patient complaining of a "pin hole" in her RIGHT nipple. She notes that PCP put her on a topical and told her it was a "milk duct". She describes recent sanguinous discharge.   She is having difficulty with emptying her bladder. Patient denies dysuria and urgency. She is voiding about every 2 hours. Patient states, "I am peeing out more than I am taking in". She denies history of recurrent urinary tract infections.   Patient denies any B symptoms or interval infections. She is eating well. Her weight is up 3 pounds. Patient denies pain in the clinic today.    Past Medical History:  Diagnosis Date  . Anxiety   . Asthma   . Atypical chest pain    a. 2003 Cath: reportedly nl per pt [name]);  b. 04/2015 [facility] Admission, neg troponin.  . Colon cancer ([place])    a. 1999: Pt had partial colectomy. Did develop lung metastasis. Had right upper lobectomy in 01/2007 then had associated chemotherapy.  Marland Kitchen COPD (chronic obstructive pulmonary disease) ([place])   . DDD (degenerative disc disease), lumbosacral   . Depression   . Echocardiogram abnormal    a. 02/2010 Echo: EF 55-60%,  mild LAE, no regional wall motion abnormalities  . Elevated transaminase level    a. ? NASH;  b. 06/2015 - nl LFTs.  . GERD (gastroesophageal reflux disease)   . Hematuria   . Holter monitor, abnormal    a. 12/2009 Holter monitoring: NSR, rare PACs and PVCs.  Marland Kitchen Hypercholesterolemia   . Hyperlipidemia   . Hypertension   . Leaky heart valve    a. Per pt report - no evidence of valvular abnormalities on prior echoes.  . Lung cancer ([place])    a. S/P resection (lobectomy - right)  . Morbid obesity ([place])   . Nephrolithiasis   . Parkinson disease ([place]) 08/2016   [name]    Past Surgical History:  Procedure Laterality Date  . BREAST EXCISIONAL BIOPSY Right 10/07/2009   benign  . CARDIAC CATHETERIZATION  2003   negative as per pt report  . CHOLECYSTECTOMY  2000  . COLON SURGERY     Colon cancer, removed part of large colon  . COLONOSCOPY N/A 04/25/2015   Procedure: COLONOSCOPY;  Surgeon: Manya Silvas, MD;  Location: [facility] ENDOSCOPY;  Service: Endoscopy;  Laterality: N/A;  . COLONOSCOPY WITH PROPOFOL N/A 06/14/2017   Procedure: COLONOSCOPY WITH PROPOFOL;  Surgeon: Manya Silvas, MD;  Location: [facility] ENDOSCOPY;  Service: Endoscopy;  Laterality: N/A;  . LOBECTOMY     Right lung, upper lobe right side removed  . TOTAL ABDOMINAL HYSTERECTOMY     abnormal bleeding  .  TUBAL LIGATION  1980    Family History  Problem Relation Age of Onset  . Coronary artery disease Mother        died @ 66 of MI.  . Mental illness Mother   . Diabetes Mother   . Heart attack Father 5       MI  . Cancer Father        lung - died in his mid-70's  . Hypertension Father   . Diabetes Father   . Sudden death Brother   . Mental illness Brother   . Heart attack Brother        MI @ 18, died @ age 52.  . Breast cancer Neg Hx     Social History:  reports that she is a non-smoker but has been exposed to tobacco smoke. She has never used smokeless tobacco. She reports that she drinks about 0.6 oz of  alcohol per week. She reports that she does not use drugs.  She lives in [place] with her daughter Victoria Hanson. The patient is alone today.  Allergies:  Allergies  Allergen Reactions  . Macrobid [Nitrofurantoin Macrocrystal] Shortness Of Breath and Palpitations  . Antihistamines, Diphenhydramine-Type Other (See Comments)    She does not know which antihistamine she has an allergic reaction to.  . Aspirin Other (See Comments)    Makes her sick on the stomach if she takes too many and causes bleeding.  Of note, she can take ibuprofen.  . Ceftin [Cefuroxime Axetil] Other (See Comments)    Chest pains  . Celecoxib Other (See Comments)    Reaction: Blood Disorder GI bleeding.  Marland Kitchen Cymbalta [Duloxetine Hcl] Other (See Comments)    Reaction: unknown  . Diphenhydramine Other (See Comments)  . Escitalopram Other (See Comments)  . Lexapro [Escitalopram Oxalate] Other (See Comments)    Reaction: Difficulty with speech  . Metronidazole Other (See Comments)    Other reaction(s): Unknown  . Nitrofurantoin Other (See Comments)  . Zoloft [Sertraline Hcl]   . Ciprofloxacin Other (See Comments)    Reaction: unknown    Current Medications: Current Outpatient Medications  Medication Sig Dispense Refill  . albuterol (VENTOLIN HFA) 108 (90 Base) MCG/ACT inhaler INHALE 2 PUFFS BY MOUTH INTO THE LUNGS EVERY 6 HOURS AS NEEDED FOR WHEEZING 18 g 0  . ALPRAZolam (XANAX) 0.5 MG tablet TAKE 1 TABLET BY MOUTH DAILY AS NEEDED FOR ANXIETY 30 tablet 0  . aspirin EC 81 MG tablet Take 81 mg by mouth daily.    . carbidopa-levodopa (SINEMET IR) 25-100 MG tablet     . fluticasone (FLONASE) 50 MCG/ACT nasal spray     . fluticasone-salmeterol (ADVAIR HFA) 115-21 MCG/ACT inhaler Inhale 2 puffs into the lungs 2 (two) times daily. 3 Inhaler 4  . Multiple Vitamin (MULTIVITAMIN) tablet Take 1 tablet by mouth daily.    . mupirocin ointment (BACTROBAN) 2 % Apply to affected area on nipple bid 22 g 0  . omeprazole (PRILOSEC) 20 MG  capsule TAKE 1 CAPSULE BY MOUTH EVERY DAY 30 capsule 3  . polyethylene glycol powder (GLYCOLAX/MIRALAX) powder     . potassium chloride SA (K-DUR,KLOR-CON) 20 MEQ tablet TAKE ONE TABLET BY MOUTH TWICE A DAY 60 tablet 3  . rosuvastatin (CRESTOR) 10 MG tablet Take 1 tablet (10 mg total) by mouth daily. 90 tablet 1  . traZODone (DESYREL) 50 MG tablet TAKE 2 TABLETS BY MOUTH AT BEDTIME 60 tablet 1  . triamterene-hydrochlorothiazide (MAXZIDE-25) 37.5-25 MG tablet Take 1 tablet by mouth daily.  30 tablet 3   No current facility-administered medications for this visit.     Review of Systems  Constitutional: Negative for diaphoresis, fever, malaise/fatigue and weight loss (up 3 pounds).  HENT: Negative.   Eyes: Negative.   Respiratory: Positive for shortness of breath (exertional; COPD and asthma). Negative for cough, hemoptysis and sputum production.   Cardiovascular: Negative for chest pain, palpitations, orthopnea, leg swelling and PND.  Gastrointestinal: Negative for abdominal pain, blood in stool, constipation, diarrhea, melena, nausea and vomiting.  Genitourinary: Positive for frequency (voiding every 2 hours). Negative for dysuria, hematuria and urgency.       Sensation of Incomplete bladder emptying  Musculoskeletal: Negative for back pain, falls, joint pain and myalgias.  Skin: Negative for itching and rash.       "pin hole" in RIGHT nipple; recent sanguinous discharge  Neurological: Negative for dizziness, tremors, weakness and headaches.       Parkinson's disease  Endo/Heme/Allergies: Does not bruise/bleed easily.  Psychiatric/Behavioral: Positive for depression (on Wellbutrin). Negative for memory loss and suicidal ideas. The patient is not nervous/anxious and does not have insomnia.   All other systems reviewed and are negative.  PERFORMANCE STATUS (ECOG):  1  Physical Exam: Blood pressure 138/83, pulse 65, temperature 97.8 F (36.6 C), temperature source Tympanic, resp. rate 18,  weight 195 lb 4 oz (88.6 kg), SpO2 98 %. GENERAL:  Well developed, well nourished, woman sitting comfortably in the exam room in no acute distress. MENTAL STATUS:  Alert and oriented to person, place and time. HEAD:  Short gray hair.  Normocephalic, atraumatic, face symmetric, no Cushingoid features. EYES:  Glasses.  Blue eyes.  Pupils equal round and reactive to light and accomodation.  No conjunctivitis or scleral icterus. ENT:  Oropharynx clear without lesion.  Tongue normal. Mucous membranes moist.  RESPIRATORY:  Clear to auscultation without rales, wheezes or rhonchi. CARDIOVASCULAR:  Regular rate and rhythm without murmur, rub or gallop. BREAST:  Right breast with small eschar overlying nipple.  No masses, skin changes or nipple discharge.  Left breast without masses, skin changes or nipple discharge.  ABDOMEN:  Soft, non-tender, with active bowel sounds, and no hepatosplenomegaly.  No masses. SKIN:  No rashes, ulcers or lesions. EXTREMITIES: Chronic lower extremity changes.  No skin discoloration or tenderness.  No palpable cords. LYMPH NODES: No palpable cervical, supraclavicular, axillary or inguinal adenopathy  NEUROLOGICAL: Unremarkable. PSYCH:  Appropriate.    Orders Only on 10/15/2017  Component Date Value Ref Range Status  . WBC 10/15/2017 7.3  3.6 - 11.0 K/uL Final  . RBC 10/15/2017 4.07  3.80 - 5.20 MIL/uL Final  . Hemoglobin 10/15/2017 11.8* 12.0 - 16.0 g/dL Final  . HCT 10/15/2017 34.5* 35.0 - 47.0 % Final  . MCV 10/15/2017 84.7  80.0 - 100.0 fL Final  . MCH 10/15/2017 29.0  26.0 - 34.0 pg Final  . MCHC 10/15/2017 34.2  32.0 - 36.0 g/dL Final  . RDW 10/15/2017 13.6  11.5 - 14.5 % Final  . Platelets 10/15/2017 224  150 - 440 K/uL Final  . Neutrophils Relative % 10/15/2017 75  % Final  . Neutro Abs 10/15/2017 5.5  1.4 - 6.5 K/uL Final  . Lymphocytes Relative 10/15/2017 18  % Final  . Lymphs Abs 10/15/2017 1.3  1.0 - 3.6 K/uL Final  . Monocytes Relative 10/15/2017 5  %  Final  . Monocytes Absolute 10/15/2017 0.4  0.2 - 0.9 K/uL Final  . Eosinophils Relative 10/15/2017 1  % Final  .  Eosinophils Absolute 10/15/2017 0.0  0 - 0.7 K/uL Final  . Basophils Relative 10/15/2017 1  % Final  . Basophils Absolute 10/15/2017 0.1  0 - 0.1 K/uL Final   Performed at [facility], [address] [place], [place] [id number]    Assessment:  Victoria Hanson is a 67 y.o. female with metastatic colon cancer s/p partial colectomy in 10/1998.   Pathologic stage was [id number] (no details are available).  She did not receive chemotherapy.  Imaging studies revealed a right lung lesion.  She underwent right upper lobe lobectomy in 01/17/2007.  Pathology confirmed metastatic colon cancer.  She received 6 months of FOLFOX chemotherapy (completed 08/2007).  Course was truncated secondary to neuropathy.  Patient notes recurrence was not associated with an elevated CEA.  Abdomen and pelvic CT on 02/16/2015 revealed no evidence of metastatic disease.  Chest, abdomen, and pelvic CT on 03/01/2016 revealed no evidence of recurrent or metastatic carcinoma.  Chest CT on 03/06/2017 revealed postsurgical changes on the right from a right upper lobectomy.  There was no evidence of malignancy..   Colonoscopy on 04/25/2015 revealed only internal hemorrhoids.  Colonoscopy on 06/14/2017 revealed 2 diminutive polyp in the transverse colon (hyperplastic polyps) and internal hemorrhoids.  CXR on 11/07/2015 revealed minimal bibasilar atelectasis.  Head MRI on 09/01/2015 was normal.  CEA has been followed: 0.8 on 12/26/2011, 0.6 on 10/29/2012, 0.9 on 10/26/2013, 0.8 on 05/17/2015, 1.1 on 08/23/2016, 0.9 on 02/28/2017, and 1.1 on 10/15/2017.   Symptomatically, she has had no further rectal bleeding. She notes concerns with incomplete bladder emptying. Her breathing has improved overall. She is followed by pulmonary medicine.  Exam reveals a small eschar to RIGHT nipple; no discharge observed.     Plan: 1.   Labs today:  CBC with diff, CMP, CEA. 2.  Review interval colonoscopy- 2 hyperplastic polyps.  Repeat colonoscopy in 5 years 3.  Discuss urinary symptoms. Will repeat UA.  4.  Chest, abdomen, and pelvic CT on 03/06/2018. 5.  Follow up with Dr. Tama High for breast evaluation as already scheduled.  6.  RTC after CT scan for MD assessment and labs (CBC with diff, CMP, CEA).   Honor Loh, NP  10/15/2017, 10:58 AM   I saw and evaluated the patient, participating in the key portions of the service and reviewing pertinent diagnostic studies and records.  I reviewed the nurse practitioner's note and agree with the findings and the plan.  The assessment and plan were discussed with the patient.  A few questions were asked by the patient and answered.   Lequita Asal, MD  10/15/2017, 10:58 AM

## 2017-10-16 ENCOUNTER — Ambulatory Visit: Payer: Medicare HMO | Admitting: Pulmonary Disease

## 2017-10-16 LAB — CEA: CEA: 1.1 ng/mL (ref 0.0–4.7)

## 2017-10-17 ENCOUNTER — Encounter: Payer: Self-pay | Admitting: Surgery

## 2017-10-17 ENCOUNTER — Ambulatory Visit (INDEPENDENT_AMBULATORY_CARE_PROVIDER_SITE_OTHER): Payer: Medicare HMO | Admitting: Surgery

## 2017-10-17 DIAGNOSIS — N649 Disorder of breast, unspecified: Secondary | ICD-10-CM

## 2017-10-17 DIAGNOSIS — L988 Other specified disorders of the skin and subcutaneous tissue: Secondary | ICD-10-CM

## 2017-10-17 NOTE — Patient Instructions (Signed)
Follow up if needed. Have you mammogram done as scheduled in August.   If drainage starts up give [place] a call and we will arrange a diagnostic mammogram with ultrasound.   Try using Coconut oil or lotion to help with dryness.

## 2017-10-18 ENCOUNTER — Telehealth: Payer: Self-pay | Admitting: Pulmonary Disease

## 2017-10-18 ENCOUNTER — Encounter: Payer: Self-pay | Admitting: Surgery

## 2017-10-18 MED ORDER — FLUTICASONE FUROATE-VILANTEROL 100-25 MCG/INH IN AEPB: 1.0000 | INHALATION_SPRAY | Freq: Every day | RESPIRATORY_TRACT | 3 refills | Status: DC

## 2017-10-18 NOTE — Telephone Encounter (Signed)
RX sent

## 2017-10-18 NOTE — Telephone Encounter (Signed)
°*  STAT* If patient is at the pharmacy, call can be transferred to refill team.   1. Which medications need to be refilled? (please list name of each medication and dose if known) breo 100 mcg/25 mcg  inh x 1 puff   2. Which pharmacy/location (including street and city if local pharmacy) is medication to be sent to? [facility]   3. Do they need a 30 day or 90 day supply? [place]

## 2017-10-18 NOTE — Progress Notes (Signed)
Surgical Clinic History and Physical  Referring provider:  Einar Pheasant, MD [place] [address], [place] [address]  HISTORY OF PRESENT ILLNESS (HPI):  67 y.o. female presents for evaluation of Right areolar lesion with nipple discharge. Patient reports she several months ago found a small amount of blood on her bra and has since noticed a small wound she describes as a "pinhole" along the edge of her Right nipple. She underwent normal screening mammogram this past August and is planning for repeat mammogram this coming August. She denies any further nipple drainage or palpable masses and previously underwent Right breast lumpectomy with benign pathology (2011). Patient also complains of dry cracked skin over her bilateral upper extremities as well.  PAST MEDICAL HISTORY (PMH):  Past Medical History:  Diagnosis Date  . Anxiety   . Asthma   . Atypical chest pain    a. 2003 Cath: reportedly nl per pt [name]);  b. 04/2015 [place] Admission, neg troponin.  . Colon cancer ([place]) 1999   a. 1999: Pt had partial colectomy. Did develop lung metastasis. Had right upper lobectomy in 01/2007 then had associated chemotherapy.  Marland Kitchen COPD (chronic obstructive pulmonary disease) ([place])   . DDD (degenerative disc disease), lumbosacral   . Depression   . Echocardiogram abnormal    a. 02/2010 Echo: EF 55-60%, mild LAE, no regional wall motion abnormalities  . Elevated transaminase level    a. ? NASH;  b. 06/2015 - nl LFTs.  . GERD (gastroesophageal reflux disease)   . Hematuria   . Holter monitor, abnormal    a. 12/2009 Holter monitoring: NSR, rare PACs and PVCs.  Marland Kitchen Hypercholesterolemia   . Hyperlipidemia   . Hypertension   . Leaky heart valve    a. Per pt report - no evidence of valvular abnormalities on prior echoes.  . Lung metastases ([place]) 2008   a. S/P resection (lobectomy - right)  . Morbid obesity ([place])   . Nephrolithiasis   . Parkinson disease ([place]) 08/2016   [name]      PAST SURGICAL HISTORY [facility]):  Past Surgical History:  Procedure Laterality Date  . BREAST EXCISIONAL BIOPSY Right 10/07/2009   benign  . CARDIAC CATHETERIZATION  2003   negative as per pt report  . CHOLECYSTECTOMY  2000  . COLON SURGERY     Colon cancer, removed part of large colon  . COLONOSCOPY N/A 04/25/2015   Procedure: COLONOSCOPY;  Surgeon: Manya Silvas, MD;  Location: [facility] ENDOSCOPY;  Service: Endoscopy;  Laterality: N/A;  . COLONOSCOPY WITH PROPOFOL N/A 06/14/2017   Procedure: COLONOSCOPY WITH PROPOFOL;  Surgeon: Manya Silvas, MD;  Location: [facility] ENDOSCOPY;  Service: Endoscopy;  Laterality: N/A;  . LOBECTOMY  01/2007   Right lung, upper lobe right side removed  . TOTAL ABDOMINAL HYSTERECTOMY     abnormal bleeding  . TUBAL LIGATION  1980     MEDICATIONS:  Prior to Admission medications   Medication Sig Start Date End Date Taking? Authorizing Provider  albuterol (VENTOLIN HFA) 108 (90 Base) MCG/ACT inhaler INHALE 2 PUFFS BY MOUTH INTO THE LUNGS EVERY 6 HOURS AS NEEDED FOR WHEEZING 09/04/17  Yes Einar Pheasant, MD  ALPRAZolam Duanne Moron) 0.5 MG tablet TAKE 1 TABLET BY MOUTH DAILY AS NEEDED FOR ANXIETY 09/13/17  Yes Crecencio Mc, MD  aspirin EC 81 MG tablet Take 81 mg by mouth daily.   Yes [provider]  carbidopa-levodopa (SINEMET IR) 25-100 MG tablet  07/30/17  Yes [provider]  fluticasone Asencion Islam)  50 MCG/ACT nasal spray  08/13/17  Yes [provider]  fluticasone-salmeterol (ADVAIR HFA) 115-21 MCG/ACT inhaler Inhale 2 puffs into the lungs 2 (two) times daily. 12/31/16  Yes Wilhelmina Mcardle, MD  Multiple Vitamin (MULTIVITAMIN) tablet Take 1 tablet by mouth daily.   Yes [provider]  mupirocin ointment (BACTROBAN) 2 % Apply to affected area on nipple bid 10/07/17  Yes Einar Pheasant, MD  omeprazole (PRILOSEC) 20 MG capsule TAKE 1 CAPSULE BY MOUTH EVERY DAY 07/10/17  Yes Einar Pheasant, MD  polyethylene glycol powder  (GLYCOLAX/MIRALAX) powder  06/03/17  Yes [provider]  potassium chloride SA (K-DUR,KLOR-CON) 20 MEQ tablet TAKE ONE TABLET BY MOUTH TWICE A DAY 07/11/17  Yes Einar Pheasant, MD  rosuvastatin (CRESTOR) 10 MG tablet Take 1 tablet (10 mg total) by mouth daily. 10/08/17  Yes Einar Pheasant, MD  traZODone (DESYREL) 50 MG tablet TAKE 2 TABLETS BY MOUTH AT BEDTIME 10/04/17  Yes Einar Pheasant, MD  triamterene-hydrochlorothiazide (MAXZIDE-25) 37.5-25 MG tablet Take 1 tablet by mouth daily. 09/04/17  Yes Einar Pheasant, MD     ALLERGIES:  Allergies  Allergen Reactions  . Macrobid [Nitrofurantoin Macrocrystal] Shortness Of Breath and Palpitations  . Antihistamines, Diphenhydramine-Type Other (See Comments)    She does not know which antihistamine she has an allergic reaction to.  . Aspirin Other (See Comments)    Makes her sick on the stomach if she takes too many and causes bleeding.  Of note, she can take ibuprofen.  . Ceftin [Cefuroxime Axetil] Other (See Comments)    Chest pains  . Celecoxib Other (See Comments)    Reaction: Blood Disorder GI bleeding.  Marland Kitchen Cymbalta [Duloxetine Hcl] Other (See Comments)    Reaction: unknown  . Diphenhydramine Other (See Comments)  . Escitalopram Other (See Comments)  . Lexapro [Escitalopram Oxalate] Other (See Comments)    Reaction: Difficulty with speech  . Metronidazole Other (See Comments)    Other reaction(s): Unknown  . Nitrofurantoin Other (See Comments)  . Zoloft [Sertraline Hcl]   . Ciprofloxacin Other (See Comments)    Reaction: unknown     SOCIAL HISTORY:  Social History   Socioeconomic History  . Marital status: Widowed    Spouse name: Not on file  . Number of children: Not on file  . Years of education: Not on file  . Highest education level: Not on file  Occupational History  . Not on file  Social Needs  . Financial resource strain: Not on file  . Food insecurity:    Worry: Not on file    Inability: Not on file  .  Transportation needs:    Medical: Not on file    Non-medical: Not on file  Tobacco Use  . Smoking status: Passive Smoke Exposure - Never Smoker  . Smokeless tobacco: Never Used  . Tobacco comment: husband and sons smoked in her home.  Substance and Sexual Activity  . Alcohol use: Yes    Alcohol/week: 0.6 oz    Types: 1 Glasses of wine per week    Comment: rare wine cooler.  . Drug use: No  . Sexual activity: Never  Lifestyle  . Physical activity:    Days per week: Not on file    Minutes per session: Not on file  . Stress: Not on file  Relationships  . Social connections:    Talks on phone: Not on file    Gets together: Not on file    Attends religious service: Not on file  Active member of club or organization: Not on file    Attends meetings of clubs or organizations: Not on file    Relationship status: Not on file  . Intimate partner violence:    Fear of current or ex partner: Not on file    Emotionally abused: Not on file    Physically abused: Not on file    Forced sexual activity: Not on file  Other Topics Concern  . Not on file  Social History Narrative   Widowed.   Disabled (colon/lung CA), retired.   Lives in [place] with her dtr, grand-dtr, and son.   Active.   Gets regular exercise, walks.    The patient currently resides (home / rehab facility / nursing home): Home The patient normally is (ambulatory / bedbound): Ambulatory  FAMILY HISTORY:  Family History  Problem Relation Age of Onset  . Coronary artery disease Mother        died @ 36 of MI.  . Mental illness Mother   . Diabetes Mother   . Heart attack Father 38       MI  . Cancer Father        lung - died in his mid-70's  . Hypertension Father   . Diabetes Father   . Sudden death Brother   . Mental illness Brother   . Heart attack Brother        MI @ 36, died @ age 61.  . Breast cancer Neg Hx     Otherwise negative/non-contributory.  REVIEW OF SYSTEMS:  Constitutional: denies any  other weight loss, fever, chills, or sweats  Eyes: denies any other vision changes, history of eye injury  ENT: denies sore throat, hearing problems  Respiratory: denies shortness of breath, wheezing  Cardiovascular: denies chest pain, palpitations Breasts: Right breast nipple areolar dry cracked skin with eschar as per HPI Gastrointestinal: denies abdominal pain, N/V, or diarrhea Musculoskeletal: denies any other joint pains or cramps  Skin: Denies any other rashes or skin discolorations except as per HPI Neurological: denies any other headache, dizziness, weakness  Psychiatric: Denies any other depression, anxiety   All other review of systems were otherwise negative   VITAL SIGNS:  BP (!) 150/87   Pulse 70   Temp 97.9 F (36.6 C)   Resp 14   Ht 5\' 4"  (1.626 m)   Wt 195 lb 12.8 oz (88.8 kg)   BMI 33.61 kg/m   PHYSICAL EXAM:  Constitutional:  -- Normal body habitus  -- Awake, alert, and oriented x3  Eyes:  -- Pupils equally round and reactive to light  -- No scleral icterus  Ear, nose, throat:  -- No jugular venous distension -- No nasal drainage, bleeding Pulmonary:  -- No crackles  -- Equal breath sounds bilaterally -- Breathing non-labored at rest Cardiovascular:  -- S1, S2 present  -- No pericardial rubs  Breast: -- no palpable masses or nipple discharge appreciated B/L -- Right peri-areolar 1 - 2 mm eschar with underlying pink healthy granulation tissue -- dry cracked skin of B/L breasts and nipples (comparable to that of B/L UE's) Gastrointestinal:  -- Abdomen soft, nontender, non-distended, no guarding/rebound  -- No abdominal masses appreciated, pulsatile or otherwise  Musculoskeletal and Integumentary:  -- Wounds or skin discoloration: None appreciated except as described above -- Extremities: B/L UE and LE FROM, hands and feet warm, no edema  Neurologic:  -- Motor function: Intact and symmetric -- Sensation: Intact and symmetric  Labs:  CBC:  Lab  Results  Component Value Date   WBC 7.3 10/15/2017   RBC 4.07 10/15/2017   BMP:  Lab Results  Component Value Date   GLUCOSE 110 (H) 10/15/2017   GLUCOSE 101 (H) 09/01/2014   CO2 23 10/15/2017   CO2 27 09/01/2014   BUN 23 (H) 10/15/2017   BUN 23 (H) 09/01/2014   CREATININE 1.16 (H) 10/15/2017   CREATININE 1.16 (H) 01/14/2017   CALCIUM 9.2 10/15/2017   CALCIUM 8.8 (L) 09/01/2014     Imaging studies:  Mammogram (01/28/2017) There are no findings suspicious for malignancy. Screening mammogram in one year recommended. BI-RADS CATEGORY  1: Negative.  Assessment/Plan:  67 y.o. female with dry cracked nipple-areolar epithelium with Right peri-areolar eschar removed without any nipple discharge reportedly x several months after a single episode of small amount blood found on bra several months ago with none since following a normal mammogram, complicated by co-morbidities including obesity (BMI 34), HTN, HLD, hypercholesterolemia, COPD, a history of partial colectomy (1999) for colorectal cancer with lung metastases s/p Right upper lobectomy (2008), Parkinson's disease, degenerative vertebral disc disease, GERD, generalized anxiety disorder, and major depression disorder.   - apply coconut oil or lotion to affected skin  - offered early diagnostic mammogram with ultrasound  - agree with patient mammogram as scheduled seems reasonable, will confirm  - return to clinic as needed, instructed to call if any questions or concerns  - call if develops nipple discharge/drainage in particular  All of the above recommendations were discussed with the patient, and all of patient's questions were answered to her expressed satisfaction.  Thank you for the opportunity to participate in this patient's care.  -- Marilynne Drivers Rosana Hoes, MD, [place]: [facility] - Partnering for exceptional care. Office: [phone]

## 2017-10-20 ENCOUNTER — Encounter: Payer: Self-pay | Admitting: Surgery

## 2017-10-20 DIAGNOSIS — N649 Disorder of breast, unspecified: Secondary | ICD-10-CM

## 2017-10-20 DIAGNOSIS — L988 Other specified disorders of the skin and subcutaneous tissue: Secondary | ICD-10-CM

## 2017-10-20 HISTORY — DX: Other specified disorders of the skin and subcutaneous tissue: L98.8

## 2017-10-20 HISTORY — DX: Disorder of breast, unspecified: N64.9

## 2017-10-25 IMAGING — CT CT ABD-PELV W/O CM
1 of 2 series · 14 of 31 positions shown, 18 images · non-contrast
Comparison: AP CT on 02/16/2015 ; chest and abdomen CT on
04/28/2012

CLINICAL DATA: Followup metastatic colon carcinoma. Worsening
nausea for 2 months. Restaging.

EXAM:
CT CHEST, ABDOMEN AND PELVIS WITHOUT CONTRAST
TECHNIQUE: Multidetector CT imaging of the chest, abdomen and pelvis was
performed following the standard protocol without IV contrast.

[Series 2: axial st · axial · 0.72mm/px · z∈[-949,-449]mm · 14 of 120 slices shown, 18 images]
[im 10/120  mediastinal]
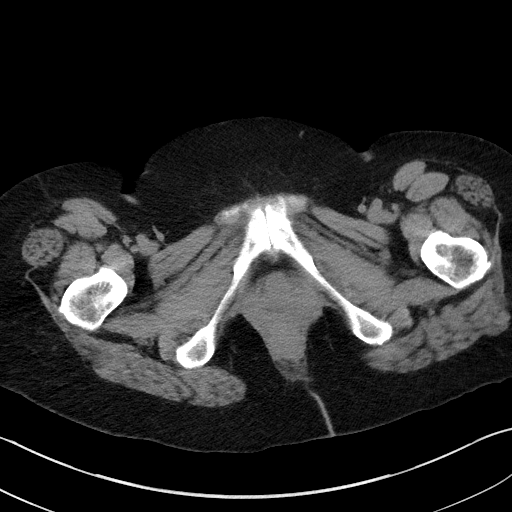
[im 10/120  lung]
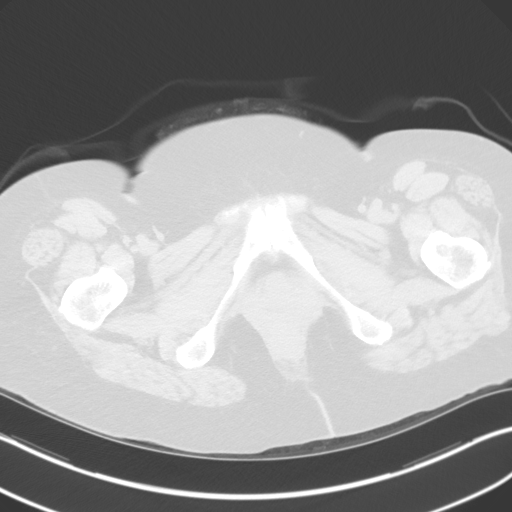
[im 19/120  lung]
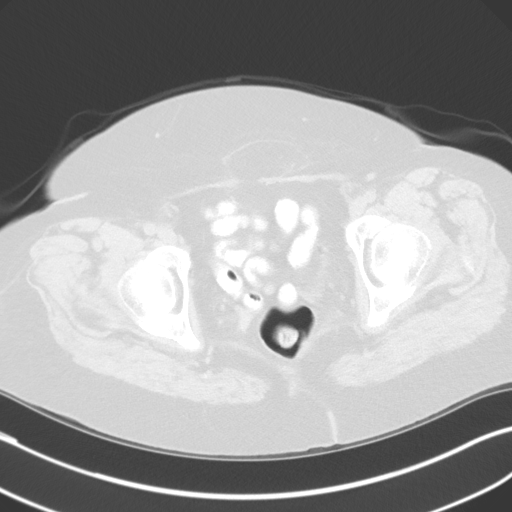
[im 28/120  lung]
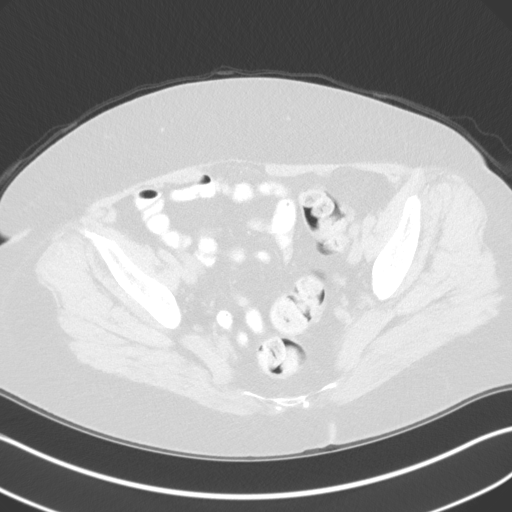
[im 37/120  lung]
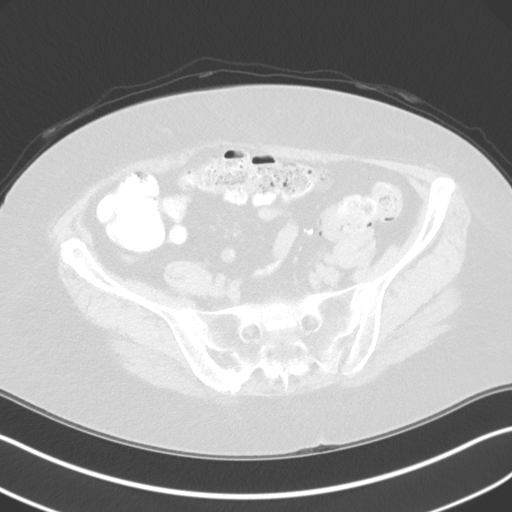
[im 46/120  mediastinal]
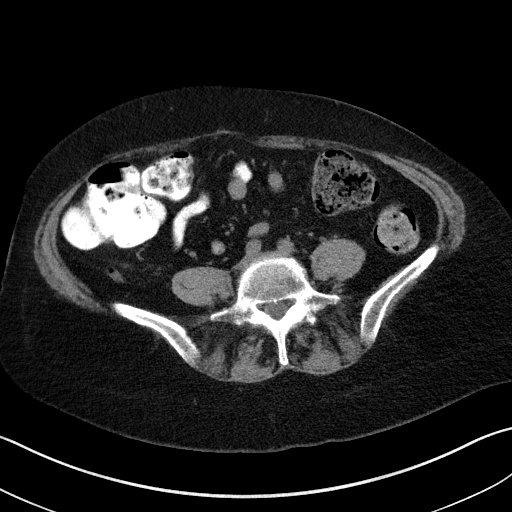
[im 46/120  lung]
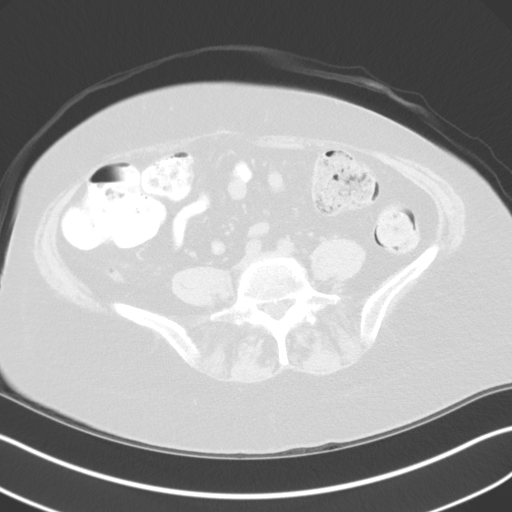
[im 55/120  lung]
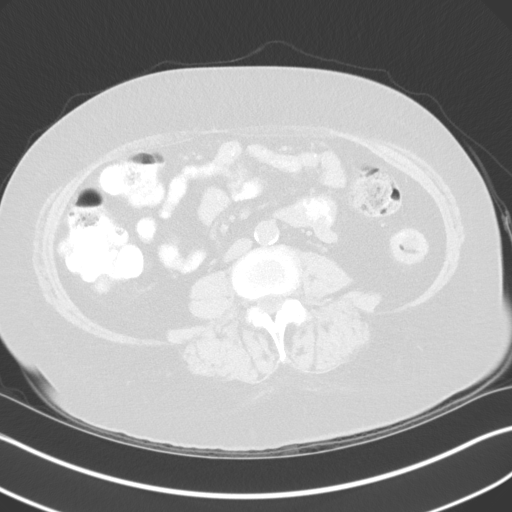
[im 59/120  lung]
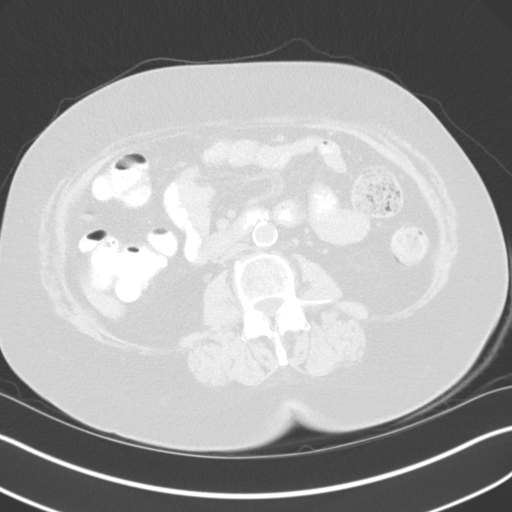
[im 60/120  lung]
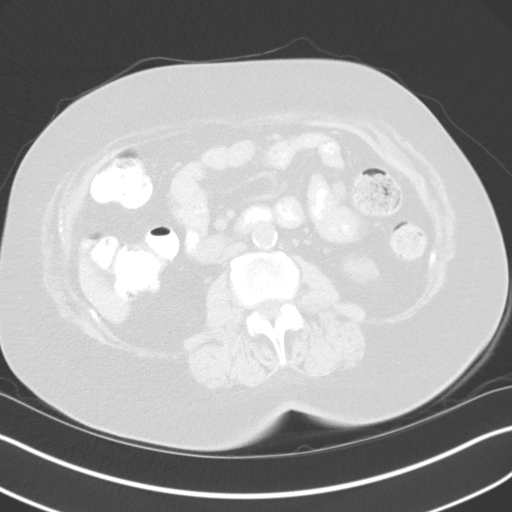
[im 65/120  mediastinal]
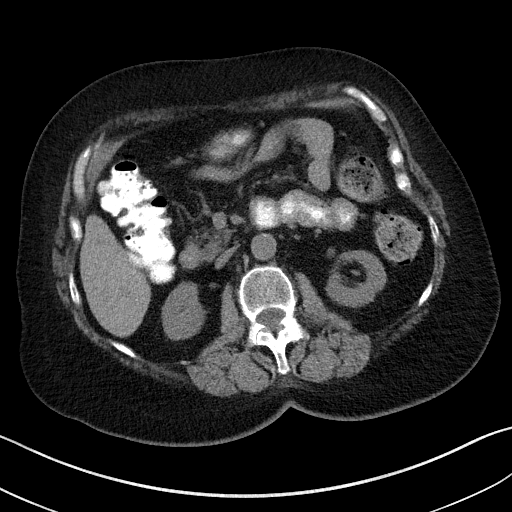
[im 65/120  lung]
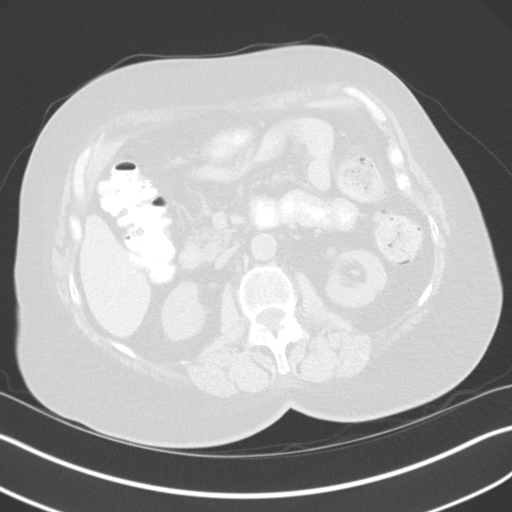
[im 74/120  lung]
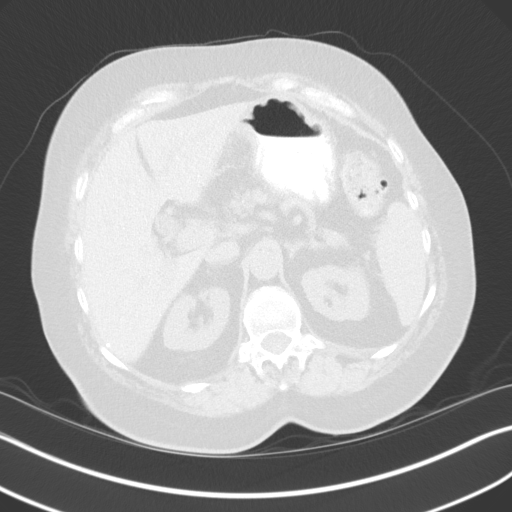
[im 83/120  lung]
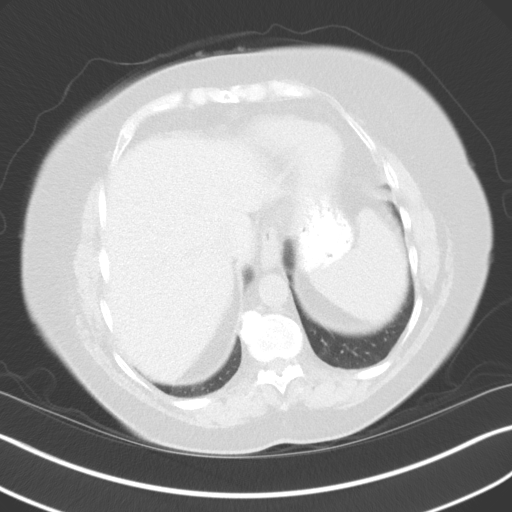
[im 92/120  lung]
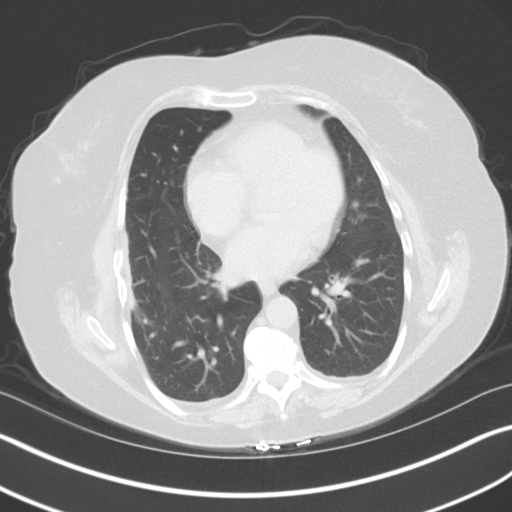
[im 101/120  mediastinal]
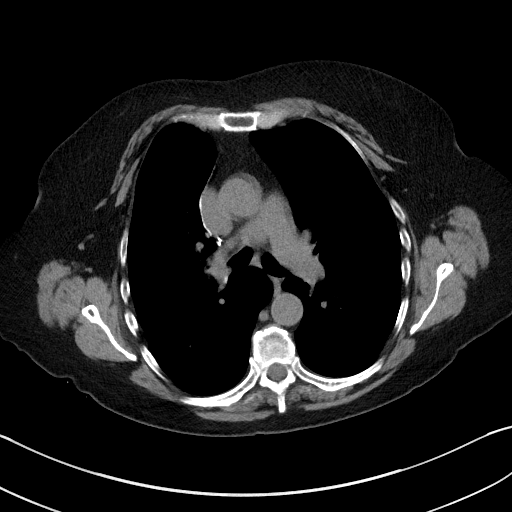
[im 101/120  lung]
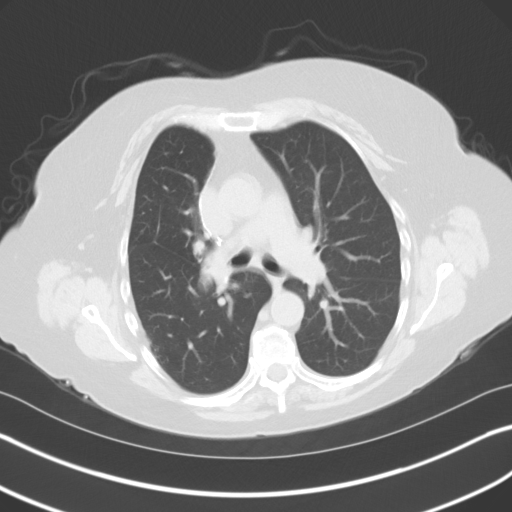
[im 110/120  lung]
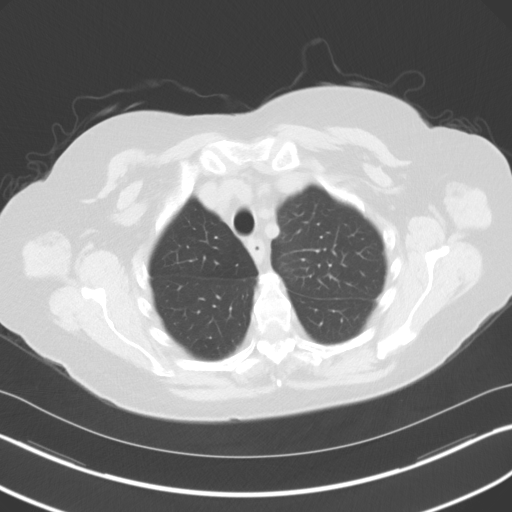

[14 of 31 positions shown; findings below may reference images not displayed]

FINDINGS: CT CHEST FINDINGS

Cardiovascular:  Normal heart size.  Aortic atherosclerosis.

Mediastinum/Lymph Nodes: No masses or pathologically enlarged lymph
nodes identified on this unenhanced exam.

Lungs/Pleura: Stable postsurgical changes in right hemithorax. No
pulmonary infiltrate or mass identified. No effusion present.

Musculoskeletal: No suspicious bone lesions or other significant
abnormality.

CT ABDOMEN AND PELVIS FINDINGS

Hepatobiliary: No masses visualized on this unenhanced exam. Prior
cholecystectomy noted. Stable dilatation of extrahepatic common bile
duct without significant intrahepatic biliary dilatation.

Pancreas: No mass or inflammatory changes identified on this
unenhanced exam.

Spleen: Within normal limits in size.

Adrenals/Urinary Tract: No evidence of urolithiasis or
hydronephrosis. Unremarkable appearance of bladder.

Stomach/Bowel: No evidence of obstruction, inflammatory process, or
abnormal fluid collections. Normal appendix visualized. Surgical
clips again seen within the left pelvis.

Vascular/Lymphatic: No pathologically enlarged lymph nodes
identified. No abdominal aortic aneurysm. Aortic atherosclerosis.

Reproductive: Prior hysterectomy noted. Adnexal regions are
unremarkable in appearance.

Other: Stable small periumbilical and suprapubic ventral abdominal
wall hernias containing only fat. No evidence herniated bowel loops.

Musculoskeletal:  No suspicious bone lesions identified.
IMPRESSION: No evidence of recurrent or metastatic carcinoma within the chest,
abdomen, or pelvis. No other acute findings identified.

Stable small periumbilical and suprapubic ventral abdominal wall
hernias containing only fat.

Aortic atherosclerosis.

## 2017-10-29 ENCOUNTER — Ambulatory Visit (INDEPENDENT_AMBULATORY_CARE_PROVIDER_SITE_OTHER): Payer: Medicare HMO | Admitting: Internal Medicine

## 2017-10-29 ENCOUNTER — Other Ambulatory Visit: Payer: Self-pay | Admitting: Internal Medicine

## 2017-10-29 ENCOUNTER — Encounter: Payer: Self-pay | Admitting: Internal Medicine

## 2017-10-29 VITALS — BP 118/70 | HR 65 | Temp 97.9°F | Ht 64.0 in | Wt 193.2 lb

## 2017-10-29 DIAGNOSIS — N3281 Overactive bladder: Secondary | ICD-10-CM | POA: Diagnosis not present

## 2017-10-29 DIAGNOSIS — N183 Chronic kidney disease, stage 3 unspecified: Secondary | ICD-10-CM

## 2017-10-29 DIAGNOSIS — N3000 Acute cystitis without hematuria: Secondary | ICD-10-CM | POA: Diagnosis not present

## 2017-10-29 DIAGNOSIS — F419 Anxiety disorder, unspecified: Secondary | ICD-10-CM | POA: Diagnosis not present

## 2017-10-29 DIAGNOSIS — G20 Parkinson's disease: Secondary | ICD-10-CM | POA: Diagnosis not present

## 2017-10-29 DIAGNOSIS — F339 Major depressive disorder, recurrent, unspecified: Secondary | ICD-10-CM | POA: Diagnosis not present

## 2017-10-29 DIAGNOSIS — E785 Hyperlipidemia, unspecified: Secondary | ICD-10-CM | POA: Diagnosis not present

## 2017-10-29 DIAGNOSIS — I10 Essential (primary) hypertension: Secondary | ICD-10-CM | POA: Diagnosis not present

## 2017-10-29 DIAGNOSIS — E7800 Pure hypercholesterolemia, unspecified: Secondary | ICD-10-CM

## 2017-10-29 DIAGNOSIS — N390 Urinary tract infection, site not specified: Secondary | ICD-10-CM | POA: Diagnosis not present

## 2017-10-29 MED ORDER — AMOXICILLIN-POT CLAVULANATE 875-125 MG PO TABS: 1.0000 | ORAL_TABLET | Freq: Two times a day (BID) | ORAL | 0 refills | Status: DC

## 2017-10-29 MED ORDER — CITALOPRAM HYDROBROMIDE 10 MG PO TABS: 10.0000 mg | ORAL_TABLET | Freq: Every day | ORAL | 2 refills | Status: DC

## 2017-10-29 MED ORDER — ALPRAZOLAM 0.5 MG PO TABS: ORAL_TABLET | ORAL | 0 refills | Status: DC

## 2017-10-29 NOTE — Progress Notes (Signed)
rx ok'd for xanax #30 with no refills.

## 2017-10-29 NOTE — Progress Notes (Signed)
Chief Complaint  Patient presents with  . Acute Visit    overactive bladder   F/u.  1. HTN controlled on maxzide 37.5-25  2. C/o overactive bladder x 2 weeks with cloudy urine and discomfort after urination. Reviewed sinemet on 25-100 2 pills tid and reviewed side effects could be bacteruria, UTI, overactive bladder though freq low  3. C/o depression and wants to try medication tried wellbutrin in the past, lexapro, cymbalta, zoloft effexor and reports increased panic. She needs refill of xanax 0.5 taking 1/2 prn. She wants to try celexa though this is SSRI family to see if helps  4. CKD 3 reviewed labs with pt and HLD on crestor 10 mg qhs now   Review of Systems  Constitutional: Negative for malaise/fatigue.  HENT: Negative for hearing loss.   Eyes: Negative for blurred vision.  Respiratory: Negative for shortness of breath.   Cardiovascular: Negative for chest pain.  Genitourinary: Positive for frequency.       +overactive bladder   Skin: Negative for rash.  Neurological: Negative for headaches.  Psychiatric/Behavioral: Positive for depression. The patient is nervous/anxious.    Past Medical History:  Diagnosis Date  . Anxiety   . Asthma   . Atypical chest pain    a. 2003 Cath: reportedly nl per pt [name]);  b. 04/2015 [facility]mission, neg troponin.  . Colon cancer ([place]) 1999   a. 1999: Pt had partial colectomy. Did develop lung metastasis. Had right upper lobectomy in 01/2007 then had associated chemotherapy.  Marland Kitchen COPD (chronic obstructive pulmonary disease) ([place])   . DDD (degenerative disc disease), lumbosacral   . Depression   . Echocardiogram abnormal    a. 02/2010 Echo: EF 55-60%, mild LAE, no regional wall motion abnormalities  . Elevated transaminase level    a. ? NASH;  b. 06/2015 - nl LFTs.  . GERD (gastroesophageal reflux disease)   . Hematuria   . Holter monitor, abnormal    a. 12/2009 Holter monitoring: NSR, rare PACs and PVCs.  Marland Kitchen Hypercholesterolemia   .  Hyperlipidemia   . Hypertension   . Leaky heart valve    a. Per pt report - no evidence of valvular abnormalities on prior echoes.  . Lesion of skin of Right breast 10/20/2017  . Lung metastases ([place]) 2008   a. S/P resection (lobectomy - right)  . Morbid obesity ([place])   . Nephrolithiasis   . Parkinson disease ([place]) 08/2016   [name]   Past Surgical History:  Procedure Laterality Date  . BREAST EXCISIONAL BIOPSY Right 10/07/2009   benign  . CARDIAC CATHETERIZATION  2003   negative as per pt report  . CHOLECYSTECTOMY  2000  . COLON SURGERY     Colon cancer, removed part of large colon  . COLONOSCOPY N/A 04/25/2015   Procedure: COLONOSCOPY;  Surgeon: Manya Silvas, MD;  Location: [facility] ENDOSCOPY;  Service: Endoscopy;  Laterality: N/A;  . COLONOSCOPY WITH PROPOFOL N/A 06/14/2017   Procedure: COLONOSCOPY WITH PROPOFOL;  Surgeon: Manya Silvas, MD;  Location: [facility] ENDOSCOPY;  Service: Endoscopy;  Laterality: N/A;  . LOBECTOMY  01/2007   Right lung, upper lobe right side removed  . TOTAL ABDOMINAL HYSTERECTOMY     abnormal bleeding  . TUBAL LIGATION  1980   Family History  Problem Relation Age of Onset  . Coronary artery disease Mother        died @ 16 of MI.  . Mental illness Mother   . Diabetes Mother   . Heart attack  Father 21       MI  . Cancer Father        lung - died in his mid-70's  . Hypertension Father   . Diabetes Father   . Sudden death Brother   . Mental illness Brother   . Heart attack Brother        MI @ 23, died @ age 32.  . Breast cancer Neg Hx    Social History   Socioeconomic History  . Marital status: Widowed    Spouse name: Not on file  . Number of children: Not on file  . Years of education: Not on file  . Highest education level: Not on file  Occupational History  . Not on file  Social Needs  . Financial resource strain: Not on file  . Food insecurity:    Worry: Not on file    Inability: Not on file  . Transportation needs:     Medical: Not on file    Non-medical: Not on file  Tobacco Use  . Smoking status: Passive Smoke Exposure - Never Smoker  . Smokeless tobacco: Never Used  . Tobacco comment: husband and sons smoked in her home.  Substance and Sexual Activity  . Alcohol use: Yes    Alcohol/week: 0.6 oz    Types: 1 Glasses of wine per week    Comment: rare wine cooler.  . Drug use: No  . Sexual activity: Never  Lifestyle  . Physical activity:    Days per week: Not on file    Minutes per session: Not on file  . Stress: Not on file  Relationships  . Social connections:    Talks on phone: Not on file    Gets together: Not on file    Attends religious service: Not on file    Active member of club or organization: Not on file    Attends meetings of clubs or organizations: Not on file    Relationship status: Not on file  . Intimate partner violence:    Fear of current or ex partner: Not on file    Emotionally abused: Not on file    Physically abused: Not on file    Forced sexual activity: Not on file  Other Topics Concern  . Not on file  Social History Narrative   Widowed.   Disabled (colon/lung CA), retired.   Lives in [place] with her dtr, grand-dtr, and son.   Active.   Gets regular exercise, walks.   Current Meds  Medication Sig  . albuterol (VENTOLIN HFA) 108 (90 Base) MCG/ACT inhaler INHALE 2 PUFFS BY MOUTH INTO THE LUNGS EVERY 6 HOURS AS NEEDED FOR WHEEZING  . ALPRAZolam (XANAX) 0.5 MG tablet TAKE 1 TABLET BY MOUTH DAILY AS NEEDED FOR ANXIETY (Patient taking differently: TAKE 1/2 TABLET BY MOUTH DAILY AS NEEDED FOR ANXIETY)  . aspirin EC 81 MG tablet Take 81 mg by mouth daily.  . carbidopa-levodopa (SINEMET IR) 25-100 MG tablet Take 2 tablets by mouth 3 (three) times daily.   . fluticasone (FLONASE) 50 MCG/ACT nasal spray   . fluticasone furoate-vilanterol (BREO ELLIPTA) 100-25 MCG/INH AEPB Inhale 1 puff into the lungs daily.  . fluticasone-salmeterol (ADVAIR HFA) 115-21 MCG/ACT  inhaler Inhale 2 puffs into the lungs 2 (two) times daily.  . Multiple Vitamin (MULTIVITAMIN) tablet Take 1 tablet by mouth daily.  . mupirocin ointment (BACTROBAN) 2 % Apply to affected area on nipple bid  . omeprazole (PRILOSEC) 20 MG capsule TAKE 1 CAPSULE  BY MOUTH EVERY DAY  . polyethylene glycol powder (GLYCOLAX/MIRALAX) powder   . potassium chloride SA (K-DUR,KLOR-CON) 20 MEQ tablet TAKE ONE TABLET BY MOUTH TWICE A DAY  . rosuvastatin (CRESTOR) 10 MG tablet Take 1 tablet (10 mg total) by mouth daily.  . traZODone (DESYREL) 50 MG tablet TAKE 2 TABLETS BY MOUTH AT BEDTIME  . triamterene-hydrochlorothiazide (MAXZIDE-25) 37.5-25 MG tablet Take 1 tablet by mouth daily.   Allergies  Allergen Reactions  . Macrobid [Nitrofurantoin Macrocrystal] Shortness Of Breath and Palpitations  . Antihistamines, Diphenhydramine-Type Other (See Comments)    She does not know which antihistamine she has an allergic reaction to.  . Aspirin Other (See Comments)    Makes her sick on the stomach if she takes too many and causes bleeding.  Of note, she can take ibuprofen.  . Ceftin [Cefuroxime Axetil] Other (See Comments)    Chest pains  . Celecoxib Other (See Comments)    Reaction: Blood Disorder GI bleeding.  Marland Kitchen Cymbalta [Duloxetine Hcl] Other (See Comments)    Reaction: unknown  . Diphenhydramine Other (See Comments)  . Escitalopram Other (See Comments)  . Lexapro [Escitalopram Oxalate] Other (See Comments)    Reaction: Difficulty with speech  . Metronidazole Other (See Comments)    Other reaction(s): Unknown  . Nitrofurantoin Other (See Comments)  . Zoloft [Sertraline Hcl]   . Ciprofloxacin Other (See Comments)    Reaction: unknown   Recent Results (from the past 2160 hour(s))  Urinalysis, Routine w reflex microscopic     Status: Abnormal   Collection Time: 10/07/17 10:08 AM  Result Value Ref Range   Color, Urine YELLOW Yellow;Lt. Yellow   APPearance CLEAR Clear   Specific Gravity, Urine 1.010  1.000 - 1.030   pH 6.0 5.0 - 8.0   Total Protein, Urine NEGATIVE Negative   Urine Glucose NEGATIVE Negative   Ketones, ur NEGATIVE Negative   Bilirubin Urine NEGATIVE Negative   Hgb urine dipstick TRACE-LYSED (A) Negative   Urobilinogen, UA 0.2 0.0 - 1.0   Leukocytes, UA TRACE (A) Negative   Nitrite NEGATIVE Negative   WBC, UA 0-2/hpf 0-2/hpf   RBC / HPF none seen 0-2/hpf   Squamous Epithelial / LPF Rare(0-4/hpf) Rare(0-4/hpf)  Urine Culture     Status: None   Collection Time: 10/07/17 10:08 AM  Result Value Ref Range   MICRO NUMBER: [id number]    SPECIMEN QUALITY: ADEQUATE    Sample Source URINE    STATUS: FINAL    Result: No Growth   Basic metabolic panel     Status: Abnormal   Collection Time: 10/07/17 10:16 AM  Result Value Ref Range   Sodium 140 135 - 145 mEq/L   Potassium 4.4 3.5 - 5.1 mEq/L   Chloride 105 96 - 112 mEq/L   CO2 25 19 - 32 mEq/L   Glucose, Bld 93 70 - 99 mg/dL   BUN 20 6 - 23 mg/dL   Creatinine, Ser 1.14 0.40 - 1.20 mg/dL   Calcium 9.2 8.4 - 10.5 mg/dL   GFR 50.40 (L) >60.00 mL/min  Lipid panel     Status: Abnormal   Collection Time: 10/07/17 10:16 AM  Result Value Ref Range   Cholesterol 213 (H) 0 - 200 mg/dL    Comment: ATP III Classification       Desirable:  < 200 mg/dL               Borderline High:  200 - 239 mg/dL  High:  > = 240 mg/dL   Triglycerides 134.0 0.0 - 149.0 mg/dL    Comment: Normal:  <150 mg/dLBorderline High:  150 - 199 mg/dL   HDL 55.70 >39.00 mg/dL   VLDL 26.8 0.0 - 40.0 mg/dL   LDL Cholesterol 130 (H) 0 - 99 mg/dL   Total CHOL/HDL Ratio 4     Comment:                Men          Women1/2 Average Risk     3.4          3.3Average Risk          5.0          4.42X Average Risk          9.6          7.13X Average Risk          15.0          11.0                       NonHDL 156.92     Comment: NOTE:  Non-HDL goal should be 30 mg/dL higher than patient's LDL goal (i.e. LDL goal of < 70 mg/dL, would have non-HDL goal of < 100  mg/dL)  Hepatic function panel     Status: None   Collection Time: 10/07/17 10:16 AM  Result Value Ref Range   Total Bilirubin 0.4 0.2 - 1.2 mg/dL   Bilirubin, Direct 0.1 0.0 - 0.3 mg/dL   Alkaline Phosphatase 77 39 - 117 U/L   AST 10 0 - 37 U/L   ALT 1 0 - 35 U/L   Total Protein 6.7 6.0 - 8.3 g/dL   Albumin 3.9 3.5 - 5.2 g/dL  Hemoglobin A1c     Status: None   Collection Time: 10/07/17 10:16 AM  Result Value Ref Range   Hgb A1c MFr Bld 6.0 4.6 - 6.5 %    Comment: Glycemic Control Guidelines for People with Diabetes:Non Diabetic:  <6%Goal of Therapy: <7%Additional Action Suggested:  >8%   CEA     Status: None   Collection Time: 10/15/17 10:32 AM  Result Value Ref Range   CEA 1.1 0.0 - 4.7 ng/mL    Comment: (NOTE)                             Nonsmokers          <3.9                             Smokers             <5.6 [employer] Electrochemiluminescence Immunoassay (ECLIA) Values obtained with different assay methods or kits cannot be used interchangeably.  Results cannot be interpreted as absolute evidence of the presence or absence of malignant disease. Performed At: [facility] [place], [place] [id number] Rush Farmer MD [id number] Performed at [facility], [address] [place], [place] [address]   Comprehensive metabolic panel     Status: Abnormal   Collection Time: 10/15/17 10:32 AM  Result Value Ref Range   Sodium 136 135 - 145 mmol/L   Potassium 4.3 3.5 - 5.1 mmol/L   Chloride 104 101 - 111 mmol/L   CO2 23 22 - 32 mmol/L   Glucose, Bld 110 (H)  65 - 99 mg/dL   BUN 23 (H) 6 - 20 mg/dL   Creatinine, Ser 1.16 (H) 0.44 - 1.00 mg/dL   Calcium 9.2 8.9 - 10.3 mg/dL   Total Protein 7.0 6.5 - 8.1 g/dL   Albumin 3.7 3.5 - 5.0 g/dL   AST 16 15 - 41 U/L   ALT <5 (L) 14 - 54 U/L   Alkaline Phosphatase 77 38 - 126 U/L   Total Bilirubin 0.5 0.3 - 1.2 mg/dL   GFR calc non Af Amer 48 (L) >60 mL/min   GFR calc Af Amer 55 (L) >60  mL/min    Comment: (NOTE) The eGFR has been calculated using the CKD EPI equation. This calculation has not been validated in all clinical situations. eGFR's persistently <60 mL/min signify possible Chronic Kidney Disease.    Anion gap 9 5 - 15    Comment: Performed at [facility], [address] [place], [place] [address]  CBC with Differential     Status: Abnormal   Collection Time: 10/15/17 10:32 AM  Result Value Ref Range   WBC 7.3 3.6 - 11.0 K/uL   RBC 4.07 3.80 - 5.20 MIL/uL   Hemoglobin 11.8 (L) 12.0 - 16.0 g/dL   HCT 34.5 (L) 35.0 - 47.0 %   MCV 84.7 80.0 - 100.0 fL   MCH 29.0 26.0 - 34.0 pg   MCHC 34.2 32.0 - 36.0 g/dL   RDW 13.6 11.5 - 14.5 %   Platelets 224 150 - 440 K/uL   Neutrophils Relative % 75 %   Neutro Abs 5.5 1.4 - 6.5 K/uL   Lymphocytes Relative 18 %   Lymphs Abs 1.3 1.0 - 3.6 K/uL   Monocytes Relative 5 %   Monocytes Absolute 0.4 0.2 - 0.9 K/uL   Eosinophils Relative 1 %   Eosinophils Absolute 0.0 0 - 0.7 K/uL   Basophils Relative 1 %   Basophils Absolute 0.1 0 - 0.1 K/uL    Comment: Performed at [facility], [address] [place], [place] [address]  Urinalysis, Complete w Microscopic     Status: Abnormal   Collection Time: 10/15/17 11:17 AM  Result Value Ref Range   Color, Urine STRAW (A) YELLOW   APPearance CLEAR (A) CLEAR   Specific Gravity, Urine 1.005 1.005 - 1.030   pH 6.0 5.0 - 8.0   Glucose, UA NEGATIVE NEGATIVE mg/dL   Hgb urine dipstick SMALL (A) NEGATIVE   Bilirubin Urine NEGATIVE NEGATIVE   Ketones, ur NEGATIVE NEGATIVE mg/dL   Protein, ur NEGATIVE NEGATIVE mg/dL   Nitrite NEGATIVE NEGATIVE   Leukocytes, UA NEGATIVE NEGATIVE   RBC / HPF 0-5 0 - 5 RBC/hpf   WBC, UA NONE SEEN 0 - 5 WBC/hpf   Bacteria, UA NONE SEEN NONE SEEN   Squamous Epithelial / LPF NONE SEEN 0 - 5    Comment: Please note change in reference range. Performed at [facility], [address] [place], [place] [address]    Objective   Body mass index is 33.16 kg/m. Wt Readings from Last 3 Encounters:  10/29/17 193 lb 3.2 oz (87.6 kg)  10/17/17 195 lb 12.8 oz (88.8 kg)  10/15/17 195 lb 4 oz (88.6 kg)   Temp Readings from Last 3 Encounters:  10/29/17 97.9 F (36.6 C) (Oral)  10/17/17 97.9 F (36.6 C)  10/15/17 97.8 F (36.6 C) (Tympanic)   BP Readings from Last 3 Encounters:  10/29/17 118/70  10/17/17 (!) 150/87  10/15/17 138/83  Pulse Readings from Last 3 Encounters:  10/29/17 65  10/17/17 70  10/15/17 65    Physical Exam  Constitutional: She is oriented to person, place, and time. Vital signs are normal. She appears well-developed and well-nourished. She is cooperative.  HENT:  Head: Normocephalic and atraumatic.  Mouth/Throat: Oropharynx is clear and moist and mucous membranes are normal.  Eyes: Pupils are equal, round, and reactive to light. Conjunctivae are normal.  Cardiovascular: Normal rate, regular rhythm and normal heart sounds.  Pulmonary/Chest: Effort normal and breath sounds normal.  Abdominal: Soft. Bowel sounds are normal. There is no tenderness.  Neg CVA ttp b/l   Neurological: She is alert and oriented to person, place, and time. Gait normal.  Skin: Skin is warm, dry and intact.  Psychiatric: She has a normal mood and affect. Her speech is normal and behavior is normal. Judgment and thought content normal. Cognition and memory are normal.  Nursing note and vitals reviewed.   Assessment   1. HTN  2. Overactive bladder r/o UTI vs is med side effect increased dose sinemet 25-100 2 pills tid  3. Anxiety and depression  4. CKD 3  5. HLD  Plan   1. Cont meds  2. UA  And culture  Trial of augmentin px Consider myrbetriq overactive bladder in future oxybutrynin DDI with sinemet  3 sent PCP message to refill xanax 0.5 taking 1/2 tab prn  Trial celexa 10 mg qam  4. Reviewed labs  5. Reviewed labs  On statin cont Given cholesterol info    Provider: Dr. Olivia Mackie  [facility]

## 2017-10-29 NOTE — Patient Instructions (Addendum)
Follow up in 6-8 weeks  We will start Celexa 10 mg in am  Consider myrbetriq for overactive bladder vs doing down on Sinemet  Start Augmentin for now 2x per day with food until we get the report back    Cholesterol Cholesterol is a white, waxy, fat-like substance that is needed by the human body in small amounts. The liver makes all the cholesterol we need. Cholesterol is carried from the liver by the blood through the blood vessels. Deposits of cholesterol (plaques) may build up on blood vessel (artery) walls. Plaques make the arteries narrower and stiffer. Cholesterol plaques increase the risk for heart attack and stroke. You cannot feel your cholesterol level even if it is very high. The only way to know that it is high is to have a blood test. Once you know your cholesterol levels, you should keep a record of the test results. Work with your health care provider to keep your levels in the desired range. What do the results mean?  Total cholesterol is a rough measure of all the cholesterol in your blood.  LDL (low-density lipoprotein) is the "bad" cholesterol. This is the type that causes plaque to build up on the artery walls. You want this level to be low.  HDL (high-density lipoprotein) is the "good" cholesterol because it cleans the arteries and carries the LDL away. You want this level to be high.  Triglycerides are fat that the body can either burn for energy or store. High levels are closely linked to heart disease. What are the desired levels of cholesterol?  Total cholesterol below 200.  LDL below 100 for people who are at risk, below 70 for people at very high risk.  HDL above 40 is good. A level of 60 or higher is considered to be protective against heart disease.  Triglycerides below 150. How can I lower my cholesterol? Diet Follow your diet program as told by your health care provider.  Choose fish or white meat chicken and [place], roasted or baked. Limit fatty cuts of  red meat, fried foods, and processed meats, such as sausage and lunch meats.  Eat lots of fresh fruits and vegetables.  Choose whole grains, beans, pasta, potatoes, and cereals.  Choose olive oil, corn oil, or canola oil, and use only small amounts.  Avoid butter, mayonnaise, shortening, or palm kernel oils.  Avoid foods with trans fats.  Drink skim or nonfat milk and eat low-fat or nonfat yogurt and cheeses. Avoid whole milk, cream, ice cream, egg yolks, and full-fat cheeses.  Healthier desserts include angel food cake, ginger snaps, animal crackers, hard candy, popsicles, and low-fat or nonfat frozen yogurt. Avoid pastries, cakes, pies, and cookies.  Exercise  Follow your exercise program as told by your health care provider. A regular program: ? Helps to decrease LDL and raise HDL. ? Helps with weight control.  Do things that increase your activity level, such as gardening, walking, and taking the stairs.  Ask your health care provider about ways that you can be more active in your daily life.  Medicine  Take over-the-counter and prescription medicines only as told by your health care provider. ? Medicine may be prescribed by your health care provider to help lower cholesterol and decrease the risk for heart disease. This is usually done if diet and exercise have failed to bring down cholesterol levels. ? If you have several risk factors, you may need medicine even if your levels are normal.  This information is not  intended to replace advice given to you by your health care provider. Make sure you discuss any questions you have with your health care provider. Document Released: 02/20/2001 Document Revised: 12/24/2015 Document Reviewed: 11/26/2015 Elsevier Interactive Patient Education  [employer].    Results for Victoria Hanson, Victoria Hanson (MRN [id number]) as of 10/29/2017 09:56  Ref. Range 10/07/2017 10:16 10/15/2017 10:32  Sodium Latest Ref Range: 135 - 145 mmol/L 140 136   Potassium Latest Ref Range: 3.5 - 5.1 mmol/L 4.4 4.3  Chloride Latest Ref Range: 101 - 111 mmol/L 105 104  CO2 Latest Ref Range: 22 - 32 mmol/L 25 23  Glucose Latest Ref Range: 65 - 99 mg/dL 93 110 (H)  BUN Latest Ref Range: 6 - 20 mg/dL 20 23 (H)  Creatinine Latest Ref Range: 0.44 - 1.00 mg/dL 1.14 1.16 (H)  Calcium Latest Ref Range: 8.9 - 10.3 mg/dL 9.2 9.2  Anion gap Latest Ref Range: 5 - 15   9  Alkaline Phosphatase Latest Ref Range: 38 - 126 U/L 77 77  Albumin Latest Ref Range: 3.5 - 5.0 g/dL 3.9 3.7  AST Latest Ref Range: 15 - 41 U/L 10 16  ALT Latest Ref Range: 14 - 54 U/L 1 <5 (L)  Total Protein Latest Ref Range: 6.5 - 8.1 g/dL 6.7 7.0  Bilirubin, Direct Latest Ref Range: 0.0 - 0.3 mg/dL 0.1   Total Bilirubin Latest Ref Range: 0.3 - 1.2 mg/dL 0.4 0.5  GFR Latest Ref Range: >60.00 mL/min 50.40 (L)   GFR, Est Non African American Latest Ref Range: >60 mL/min  48 (L)  GFR, Est African American Latest Ref Range: >60 mL/min  55 (L)  Total CHOL/HDL Ratio Unknown 4   Cholesterol Latest Ref Range: 0 - 200 mg/dL 213 (H)   HDL Cholesterol Latest Ref Range: >39.00 mg/dL 55.70   LDL (calc) Latest Ref Range: 0 - 99 mg/dL 130 (H)   NonHDL Unknown 156.92   Triglycerides Latest Ref Range: 0.0 - 149.0 mg/dL 134.0   VLDL Latest Ref Range: 0.0 - 40.0 mg/dL 26.8     Results for Victoria Hanson, Victoria Hanson (MRN [id number]) as of 10/29/2017 09:56  Ref. Range 10/15/2017 10:32  WBC Latest Ref Range: 3.6 - 11.0 K/uL 7.3  RBC Latest Ref Range: 3.80 - 5.20 MIL/uL 4.07  Hemoglobin Latest Ref Range: 12.0 - 16.0 g/dL 11.8 (L)  HCT Latest Ref Range: 35.0 - 47.0 % 34.5 (L)  MCV Latest Ref Range: 80.0 - 100.0 fL 84.7  MCH Latest Ref Range: 26.0 - 34.0 pg 29.0  MCHC Latest Ref Range: 32.0 - 36.0 g/dL 34.2  RDW Latest Ref Range: 11.5 - 14.5 % 13.6  Platelets Latest Ref Range: 150 - 440 K/uL 224   Results for Victoria Hanson, Victoria Hanson (MRN [id number]) as of 10/29/2017 09:56  Ref. Range 10/07/2017 10:16  Hemoglobin A1C Latest  Ref Range: 4.6 - 6.5 % 6.0    Mirabegron extended-release tablets What is this medicine? MIRABEGRON (MIR a BEG ron) is used to treat overactive bladder. This medicine reduces the amount of bathroom visits. It may also help to control wetting accidents. This medicine may be used for other purposes; ask your health care provider or pharmacist if you have questions. COMMON BRAND NAME(S): Myrbetriq What should I tell my health care provider before I take this medicine? They need to know if you have any of these conditions: -difficulty passing urine -high blood pressure -kidney disease -liver disease -an unusual or allergic reaction to mirabegron, other medicines, foods,  dyes, or preservatives -pregnant or trying to get pregnant -breast-feeding How should I use this medicine? Take this medicine by mouth with a glass of water. Follow the directions on the prescription label. Do not cut, crush or chew this medicine. You can take it with or without food. If it upsets your stomach, take it with food. Take your medicine at regular intervals. Do not take it more often than directed. Do not stop taking except on your doctor's advice. Talk to your pediatrician regarding the use of this medicine in children. Special care may be needed. Overdosage: If you think you have taken too much of this medicine contact a poison control center or emergency room at once. NOTE: This medicine is only for you. Do not share this medicine with others. What if I miss a dose? If you miss a dose, take it as soon as you can. If it is almost time for your next dose, take only that dose. Do not take double or extra doses. What may interact with this medicine? -certain medicines for bladder problems like fesoterodine, oxybutynin, solifenacin, tolterodine -desipramine -digoxin -flecainide -ketoconazole -MAOIs like Carbex, Eldepryl, Marplan, Nardil, and Parnate -metoprolol -propafenone -thioridazine -warfarin This list  may not describe all possible interactions. Give your health care provider a list of all the medicines, herbs, non-prescription drugs, or dietary supplements you use. Also tell them if you smoke, drink alcohol, or use illegal drugs. Some items may interact with your medicine. What should I watch for while using this medicine? It may take 8 weeks to notice the full benefit from this medicine. You may need to limit your intake tea, coffee, caffeinated sodas, and alcohol. These drinks may make your symptoms worse. Visit your doctor or health care professional for regular checks on your progress. Check your blood pressure as directed. Ask your doctor or health care professional what your blood pressure should be and when you should contact him or her. What side effects may I notice from receiving this medicine? Side effects that you should report to your doctor or health care professional as soon as possible: -allergic reactions like skin rash, itching or hives, swelling of the face, lips, or tongue -chest pain or palpitations -severe or sudden headache -high blood pressure -fast, irregular heartbeat -redness, blistering, peeling or loosening of the skin, including inside the mouth -signs of infection like fever or chills; cough; sore throat; pain or difficulty passing urine -trouble passing urine or change in the amount of urine Side effects that usually do not require medical attention (report to your doctor or health care professional if they continue or are bothersome): -constipation -diarrhea -dizziness -dry eyes -joint pain -mild headache -nausea -runny nose This list may not describe all possible side effects. Call your doctor for medical advice about side effects. You may report side effects to FDA at 1-800-FDA-1088. Where should I keep my medicine? Keep out of the reach of children. Store at room temperature between 15 and 30 degrees C (59 and 86 degrees F). Throw away any unused  medicine after the expiration date. NOTE: This sheet is a summary. It may not cover all possible information. If you have questions about this medicine, talk to your doctor, pharmacist, or health care provider.  2018 Elsevier/Gold Standard (2015-06-30 12:14:30)

## 2017-10-29 NOTE — Progress Notes (Signed)
Pre visit review using our clinic review tool, if applicable. No additional management support is needed unless otherwise documented below in the visit note. 

## 2017-10-30 ENCOUNTER — Other Ambulatory Visit: Payer: Self-pay | Admitting: Internal Medicine

## 2017-10-30 LAB — URINALYSIS, ROUTINE W REFLEX MICROSCOPIC
Bilirubin, UA: NEGATIVE
Glucose, UA: NEGATIVE
Ketones, UA: NEGATIVE
NITRITE UA: POSITIVE — AB
PH UA: 7 (ref 5.0–7.5)
Specific Gravity, UA: 1.015 (ref 1.005–1.030)
UUROB: 0.2 mg/dL (ref 0.2–1.0)

## 2017-10-30 LAB — MICROSCOPIC EXAMINATION: CASTS: NONE SEEN /LPF

## 2017-10-31 ENCOUNTER — Telehealth: Payer: Self-pay

## 2017-10-31 NOTE — Telephone Encounter (Signed)
Patient is aware that she has a UTI and that we are waiting on culture results.

## 2017-10-31 NOTE — Telephone Encounter (Signed)
Patient wanted Dr. Nicki Reaper to know she is now having back pain on both sides of her lower back and thinks it could be kidney related.

## 2017-10-31 NOTE — Telephone Encounter (Signed)
Copied from [place] [id number]. Topic: Quick Communication - See Telephone Encounter >> Oct 31, 2017 11:47 AM Antonieta Iba C wrote: CRM for notification. See Telephone encounter for: 10/31/17.   Pt called in to request her UA results.   Please call back [phone]

## 2017-11-01 ENCOUNTER — Other Ambulatory Visit: Payer: Self-pay | Admitting: Internal Medicine

## 2017-11-01 DIAGNOSIS — N3001 Acute cystitis with hematuria: Secondary | ICD-10-CM

## 2017-11-01 LAB — URINE CULTURE

## 2017-11-01 MED ORDER — SULFAMETHOXAZOLE-TRIMETHOPRIM 800-160 MG PO TABS: 1.0000 | ORAL_TABLET | Freq: Two times a day (BID) | ORAL | 0 refills | Status: DC

## 2017-11-03 IMAGING — CR DG CHEST 2V
1 series · 2 of 2 positions shown · non-contrast
Comparison: Multiple priors, most recently chest x-ray 11/07/2015.

CLINICAL DATA: 65-year-old female with new onset of midsternal
chest pain since yesterday.

EXAM:
CHEST  2 VIEW

[Series 1: w chest pa · 0.14mm/px · 2 of 2 slices shown]
[im 1/2]
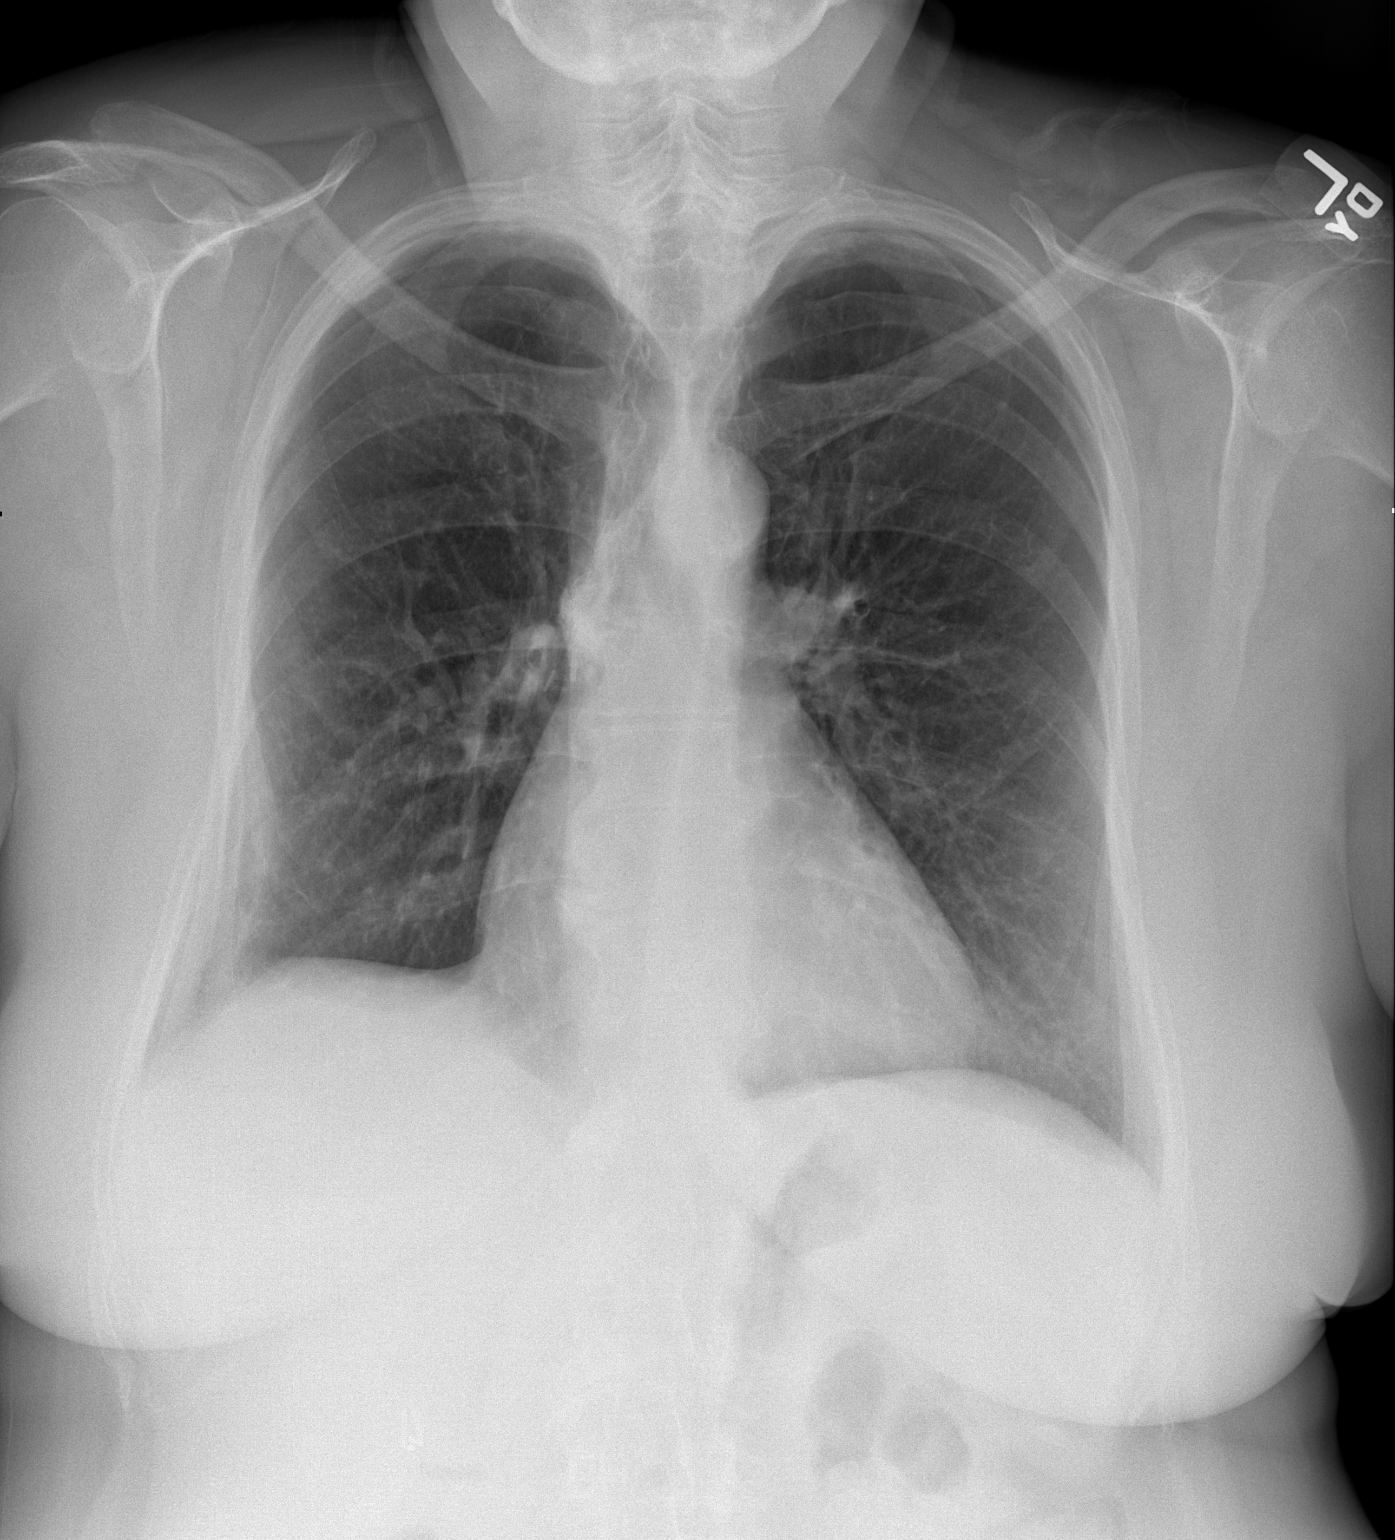
[im 2/2]
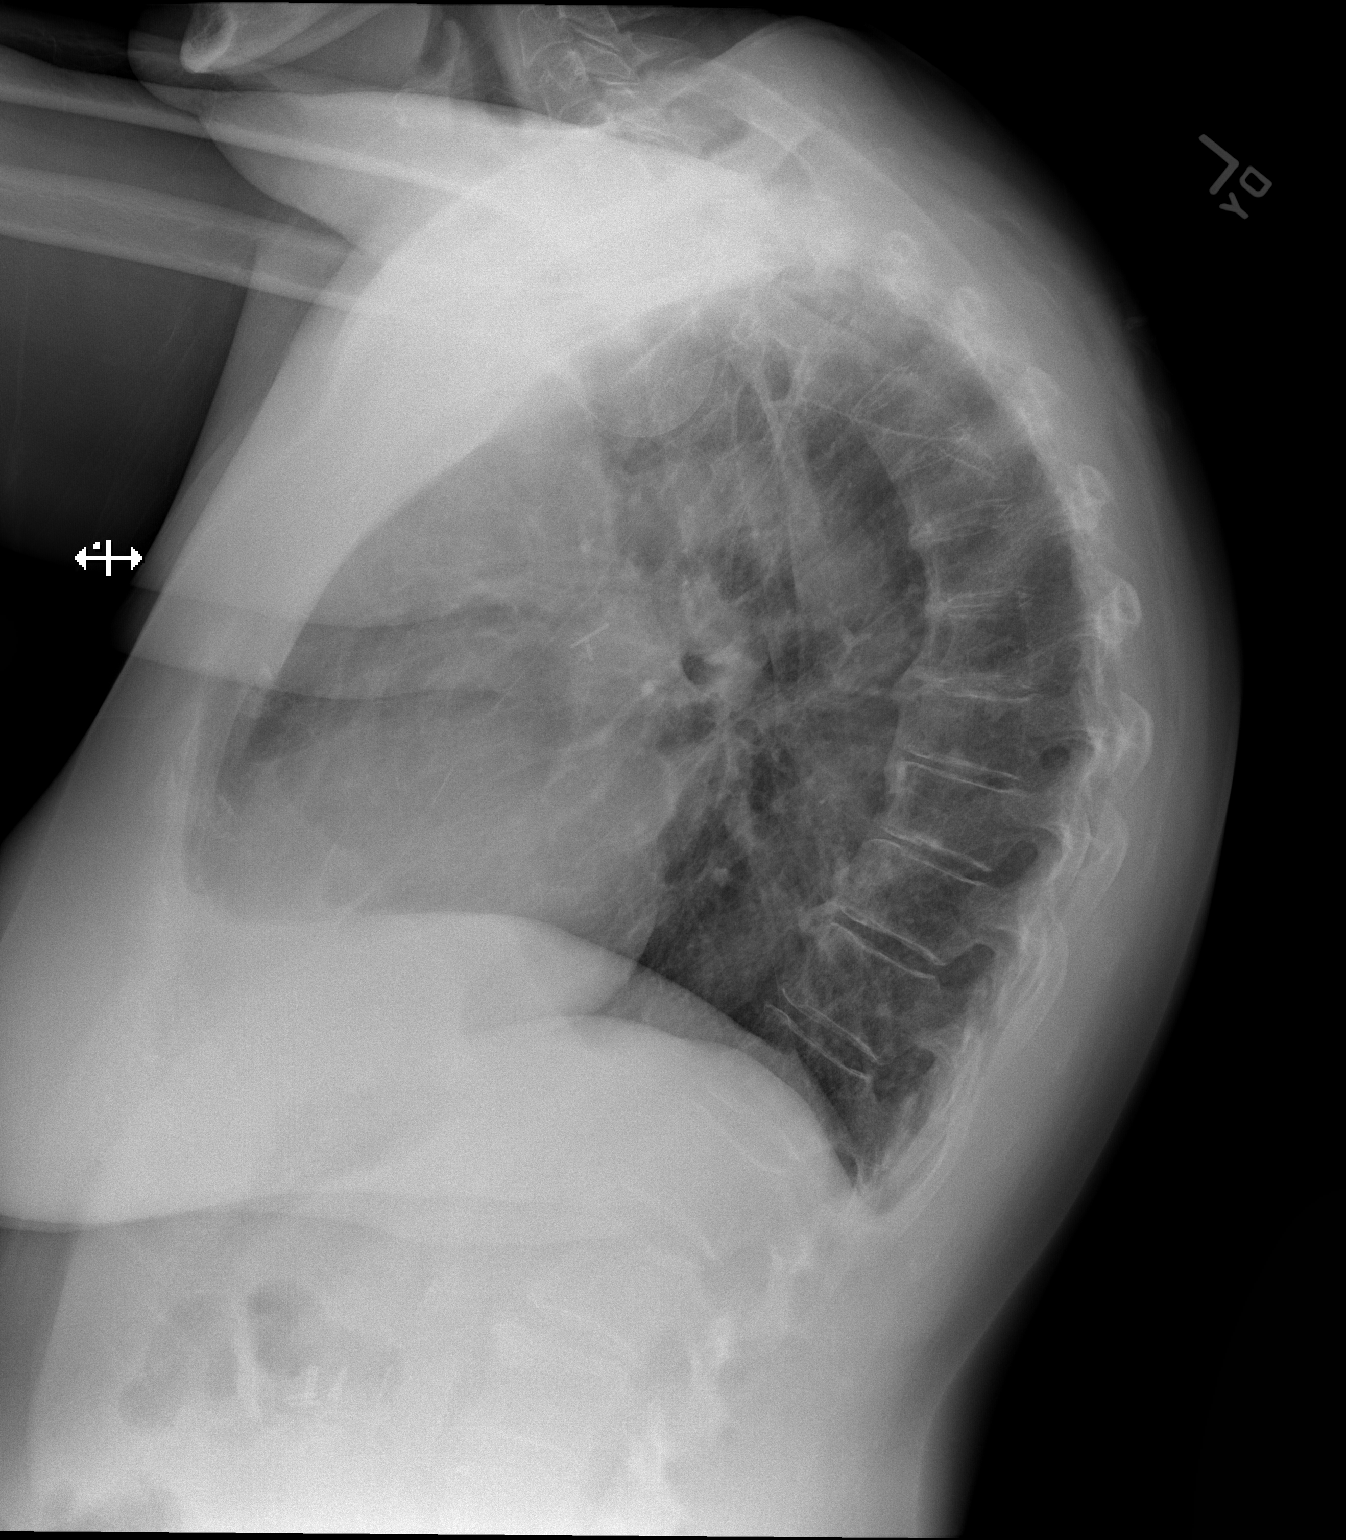

[2 of 2 positions shown; findings below may reference images not displayed]

FINDINGS: Postoperative changes of right upper lobectomy. Lung volumes are
normal. No consolidative airspace disease. No pleural effusions. No
pneumothorax. No pulmonary nodule or mass noted. Pulmonary
vasculature and the cardiomediastinal silhouette are within normal
limits. Surgical clips project over the right upper quadrant of the
abdomen, compatible with prior history of cholecystectomy.
IMPRESSION: 1.  No radiographic evidence of acute cardiopulmonary disease.
2. Status post right upper lobectomy.

## 2017-11-03 IMAGING — CT CT ANGIO CHEST
2 of 6 series · 18 of 46 positions shown · IV contrast (APPLIED)
Comparison: No priors.

CLINICAL DATA: 65-year-old female with history of chest pain since
yesterday evening. Pain is worse during breathing. Shortness of
breath.

EXAM:
CT ANGIOGRAPHY CHEST WITH CONTRAST
TECHNIQUE: Multidetector CT imaging of the chest was performed using the
standard protocol during bolus administration of intravenous
contrast. Multiplanar CT image reconstructions and MIPs were
obtained to evaluate the vascular anatomy.
CONTRAST:  60 mL of Isovue 370.

[Series 5: thins · axial · 0.66mm/px · z∈[-528,-320]mm · 16 of 228 slices shown]
[im 10/228  lung]
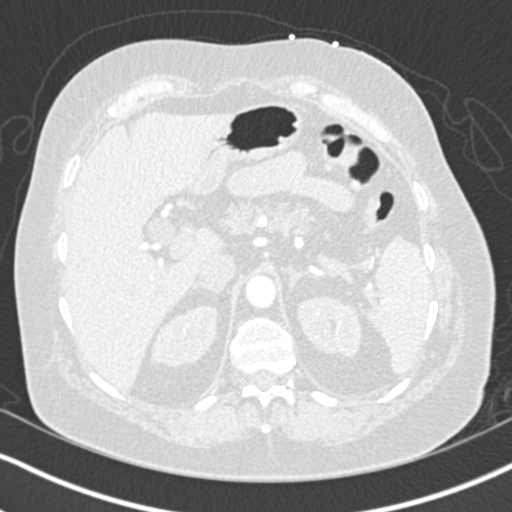
[im 30/228  soft-tissue]
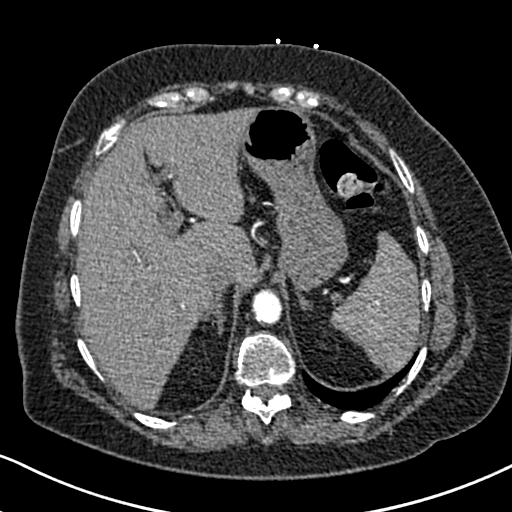
[im 40/228  lung]
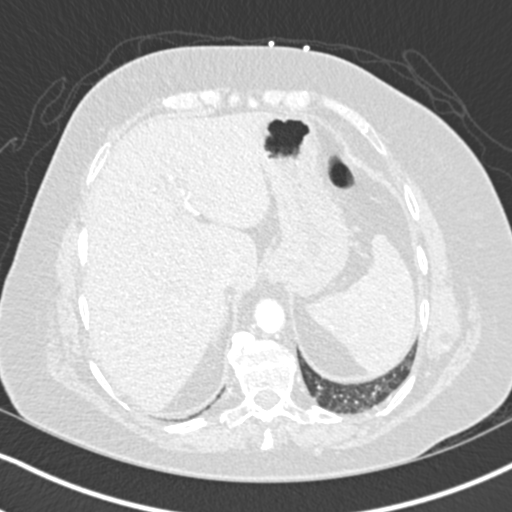
[im 50/228  soft-tissue]
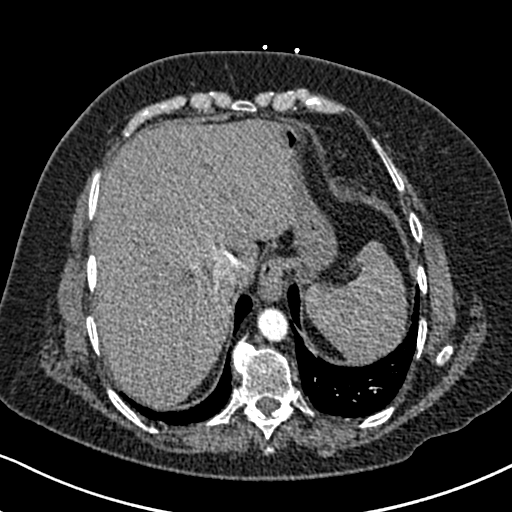
[im 70/228  lung]
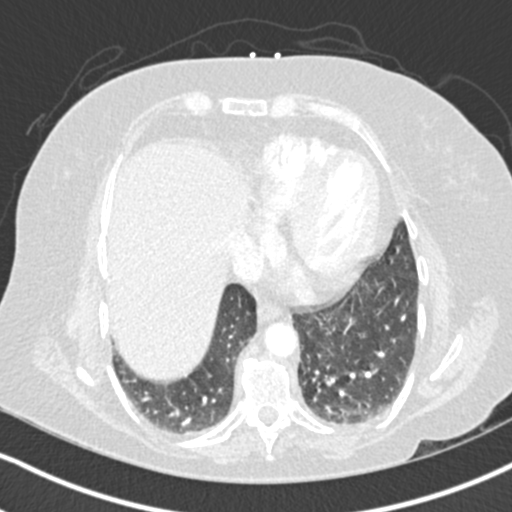
[im 79/228  soft-tissue]
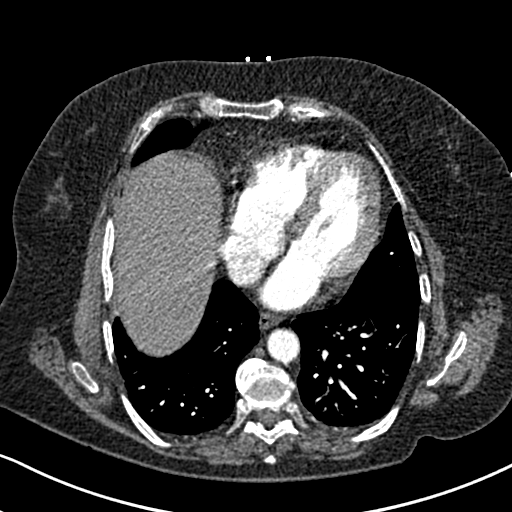
[im 89/228  lung]
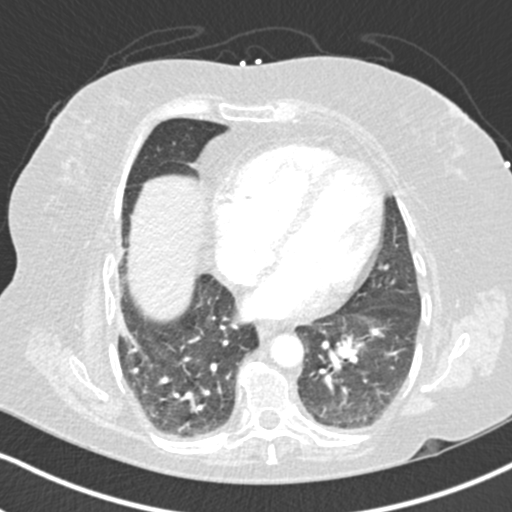
[im 109/228  soft-tissue]
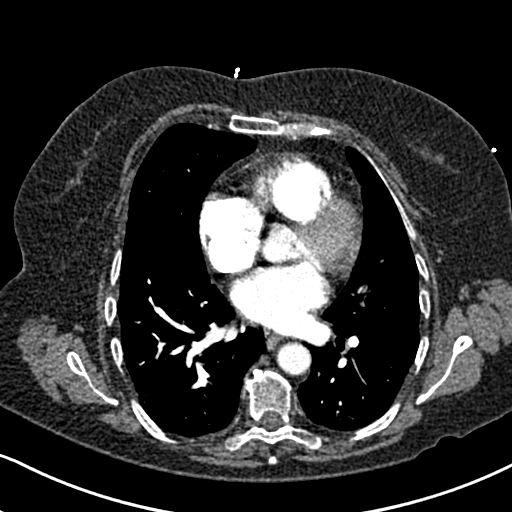
[im 119/228  lung]
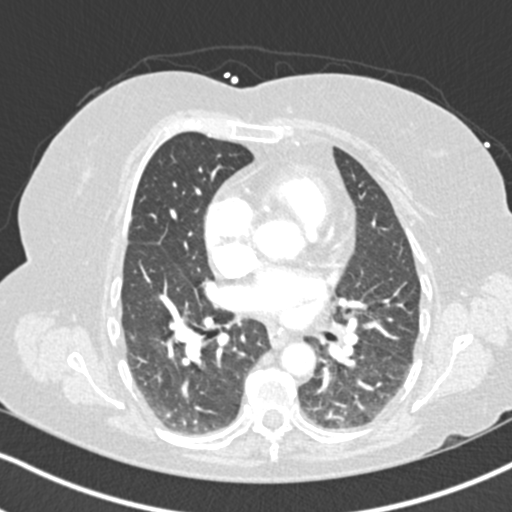
[im 139/228  soft-tissue]
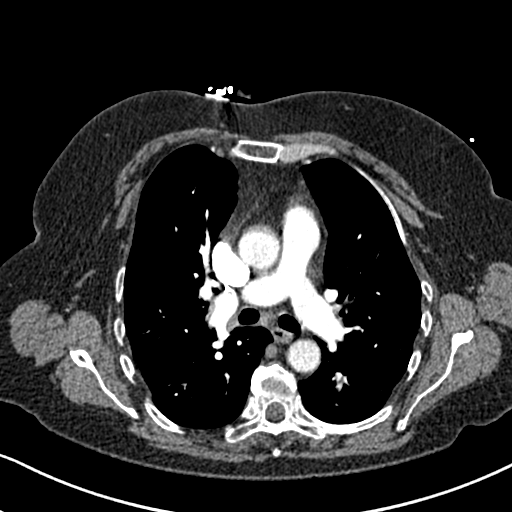
[im 149/228  lung]
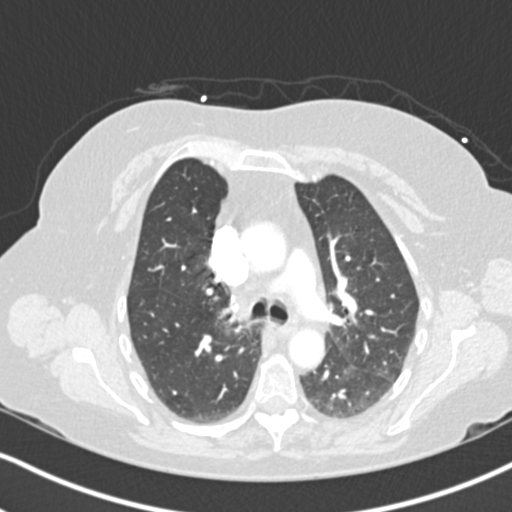
[im 158/228  soft-tissue]
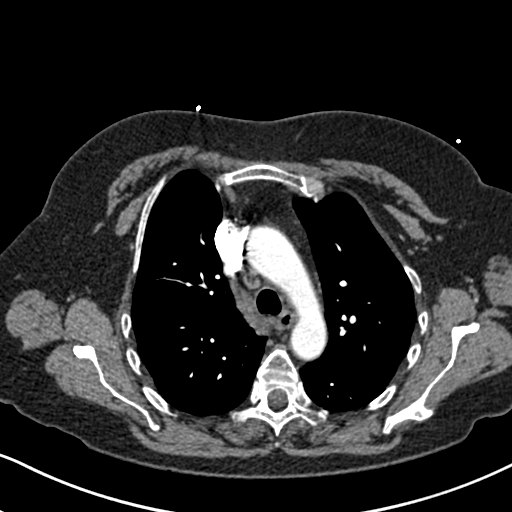
[im 178/228  lung]
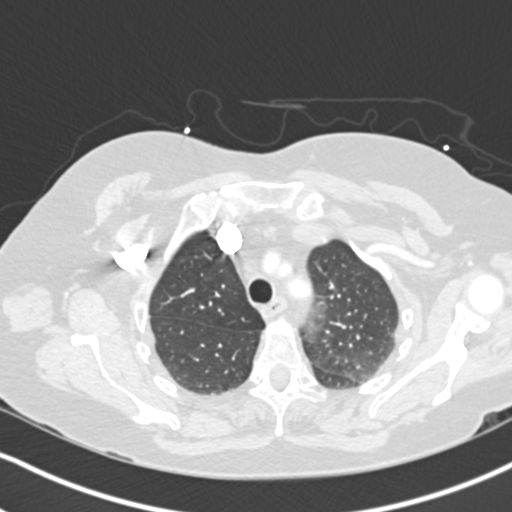
[im 188/228  soft-tissue]
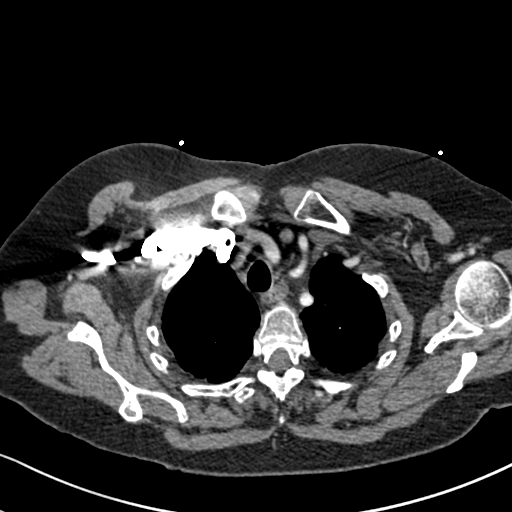
[im 198/228  lung]
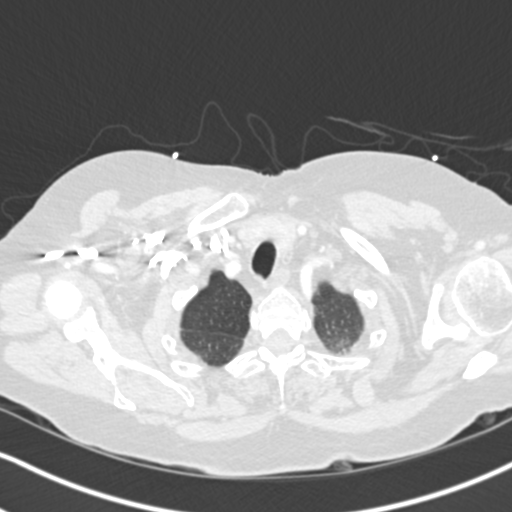
[im 218/228  soft-tissue]
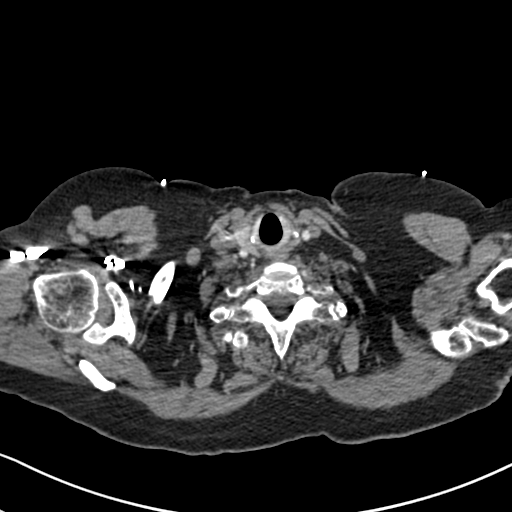

[Series 7: coronal mpr · coronal · 0.46mm/px · 2 of 94 slices shown]
[im 32/94  soft-tissue]
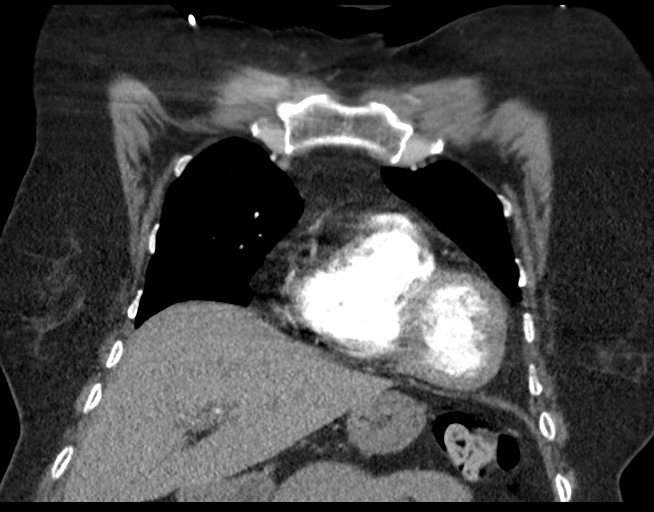
[im 63/94  soft-tissue]
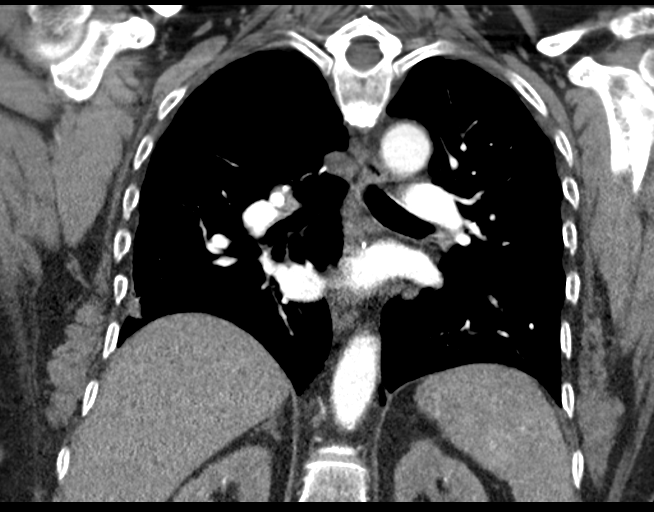

[18 of 46 positions shown; findings below may reference images not displayed]

FINDINGS: Cardiovascular: No filling defects within the pulmonary arterial
tree to suggest underlying pulmonary embolism. Heart size is mildly
enlarged. There is no significant pericardial fluid, thickening or
pericardial calcification.

Mediastinum/Nodes: No pathologically enlarged mediastinal or hilar
lymph nodes. Esophagus is unremarkable in appearance. No axillary
lymphadenopathy.

Lungs/Pleura: Status post right upper lobectomy. No suspicious
appearing pulmonary nodule or mass. No acute consolidative airspace
disease. No pleural effusions.

Upper Abdomen: Status post cholecystectomy.

Musculoskeletal: There are no aggressive appearing lytic or blastic
lesions noted in the visualized portions of the skeleton.

Review of the MIP images confirms the above findings.
IMPRESSION: 1. No evidence of pulmonary embolism.
2. No acute findings in the thorax to account for the patient's
symptoms.
3. Status post right upper lobectomy.

## 2017-11-05 ENCOUNTER — Ambulatory Visit: Payer: Medicare HMO

## 2017-11-05 ENCOUNTER — Telehealth: Payer: Self-pay | Admitting: Pulmonary Disease

## 2017-11-05 NOTE — Telephone Encounter (Signed)
Patient has diarheaa  And is unable to do pft today.  Please r/s

## 2017-11-05 NOTE — Telephone Encounter (Signed)
Called patient and attempted to schedule PFT for 11/07/17 so pt could keep her 11/12/17 appointment with Dr. Alva Garnet. Pt will still be on antibiotic and requested to move both appointments out.  Pt has R/S PFT for 11/26/17 at 2:30 at [facility] to arrive at 2:15 to the [place] for PFT and CXR.  Appointment with DS has been r/s to 12/03/17 at 10:30.  Pt mailed out appointment information. Rhonda J Cobb Nothing else needed. Rhonda J Cobb

## 2017-11-07 ENCOUNTER — Ambulatory Visit: Payer: Self-pay

## 2017-11-07 ENCOUNTER — Encounter: Payer: Self-pay | Admitting: Emergency Medicine

## 2017-11-07 ENCOUNTER — Emergency Department
Admission: EM | Admit: 2017-11-07 | Discharge: 2017-11-07 | Disposition: A | Discharge status: Home / Self Care | Payer: Medicare HMO | Attending: Student in an Organized Health Care Education/Training Program | Admitting: Student in an Organized Health Care Education/Training Program

## 2017-11-07 ENCOUNTER — Emergency Department: Payer: Medicare HMO

## 2017-11-07 ENCOUNTER — Ambulatory Visit: Payer: Medicare HMO

## 2017-11-07 ENCOUNTER — Other Ambulatory Visit: Payer: Self-pay

## 2017-11-07 DIAGNOSIS — Z8673 Personal history of transient ischemic attack (TIA), and cerebral infarction without residual deficits: Secondary | ICD-10-CM | POA: Insufficient documentation

## 2017-11-07 DIAGNOSIS — R110 Nausea: Secondary | ICD-10-CM | POA: Insufficient documentation

## 2017-11-07 DIAGNOSIS — N183 Chronic kidney disease, stage 3 (moderate): Secondary | ICD-10-CM | POA: Diagnosis not present

## 2017-11-07 DIAGNOSIS — R140 Abdominal distension (gaseous): Secondary | ICD-10-CM | POA: Insufficient documentation

## 2017-11-07 DIAGNOSIS — J449 Chronic obstructive pulmonary disease, unspecified: Secondary | ICD-10-CM | POA: Insufficient documentation

## 2017-11-07 DIAGNOSIS — E785 Hyperlipidemia, unspecified: Secondary | ICD-10-CM | POA: Diagnosis not present

## 2017-11-07 DIAGNOSIS — E7800 Pure hypercholesterolemia, unspecified: Secondary | ICD-10-CM | POA: Insufficient documentation

## 2017-11-07 DIAGNOSIS — I129 Hypertensive chronic kidney disease with stage 1 through stage 4 chronic kidney disease, or unspecified chronic kidney disease: Secondary | ICD-10-CM | POA: Insufficient documentation

## 2017-11-07 DIAGNOSIS — Z7982 Long term (current) use of aspirin: Secondary | ICD-10-CM | POA: Insufficient documentation

## 2017-11-07 DIAGNOSIS — Z7722 Contact with and (suspected) exposure to environmental tobacco smoke (acute) (chronic): Secondary | ICD-10-CM | POA: Insufficient documentation

## 2017-11-07 DIAGNOSIS — R319 Hematuria, unspecified: Secondary | ICD-10-CM | POA: Diagnosis not present

## 2017-11-07 DIAGNOSIS — G20 Parkinson's disease: Secondary | ICD-10-CM | POA: Insufficient documentation

## 2017-11-07 DIAGNOSIS — Z79899 Other long term (current) drug therapy: Secondary | ICD-10-CM | POA: Insufficient documentation

## 2017-11-07 DIAGNOSIS — R1013 Epigastric pain: Secondary | ICD-10-CM | POA: Insufficient documentation

## 2017-11-07 LAB — COMPREHENSIVE METABOLIC PANEL
ALBUMIN: 3.9 g/dL (ref 3.5–5.0)
ALK PHOS: 74 U/L (ref 38–126)
AST: 19 U/L (ref 15–41)
Anion gap: 12 (ref 5–15)
BUN: 19 mg/dL (ref 6–20)
CALCIUM: 8.9 mg/dL (ref 8.9–10.3)
CO2: 21 mmol/L — AB (ref 22–32)
CREATININE: 1.56 mg/dL — AB (ref 0.44–1.00)
Chloride: 100 mmol/L — ABNORMAL LOW (ref 101–111)
GFR calc Af Amer: 39 mL/min — ABNORMAL LOW (ref >60)
GFR calc non Af Amer: 33 mL/min — ABNORMAL LOW (ref >60)
GLUCOSE: 139 mg/dL — AB (ref 65–99)
Potassium: 3.7 mmol/L (ref 3.5–5.1)
SODIUM: 133 mmol/L — AB (ref 135–145)
Total Bilirubin: 0.4 mg/dL (ref 0.3–1.2)
Total Protein: 7.4 g/dL (ref 6.5–8.1)

## 2017-11-07 LAB — URINALYSIS, COMPLETE (UACMP) WITH MICROSCOPIC
BILIRUBIN URINE: NEGATIVE
GLUCOSE, UA: NEGATIVE mg/dL
KETONES UR: NEGATIVE mg/dL
Nitrite: NEGATIVE
PH: 6 (ref 5.0–8.0)
PROTEIN: NEGATIVE mg/dL
Specific Gravity, Urine: 1.006 (ref 1.005–1.030)

## 2017-11-07 LAB — CBC
HCT: 35 % (ref 35.0–47.0)
Hemoglobin: 11.7 g/dL — ABNORMAL LOW (ref 12.0–16.0)
MCH: 28.5 pg (ref 26.0–34.0)
MCHC: 33.4 g/dL (ref 32.0–36.0)
MCV: 85.2 fL (ref 80.0–100.0)
PLATELETS: 225 10*3/uL (ref 150–440)
RBC: 4.1 MIL/uL (ref 3.80–5.20)
RDW: 13.5 % (ref 11.5–14.5)
WBC: 8.5 10*3/uL (ref 3.6–11.0)

## 2017-11-07 LAB — LIPASE, BLOOD: Lipase: 24 U/L (ref 11–51)

## 2017-11-07 MED ORDER — ONDANSETRON 4 MG PO TBDP
4.0000 mg | ORAL_TABLET | Freq: Once | ORAL | Status: AC
  Administered 2017-11-07: 4 mg via ORAL
  Filled 2017-11-07: qty 1

## 2017-11-07 MED ORDER — ONDANSETRON 4 MG PO TBDP: 4.0000 mg | ORAL_TABLET | Freq: Three times a day (TID) | ORAL | 0 refills | Status: DC | PRN

## 2017-11-07 NOTE — Telephone Encounter (Signed)
Will follow up to make sure patient is evaluated.

## 2017-11-07 NOTE — Telephone Encounter (Signed)
Noted.  Agree with need for evaluation.

## 2017-11-07 NOTE — ED Triage Notes (Signed)
Pt to ED c/o abd bloating that started yesterday, has gone down today, states burning sensation in abd.  Is being treated for UTI and stopped taking bactrim yesterday thinking that was the cause.  Passing gas, LBM yesterday.  Save label labs and urine sent.

## 2017-11-07 NOTE — Telephone Encounter (Signed)
Pt c/o abdominal pain, nausea and diarrhea. Pt c/o of abdominal distension. Pt describes the pain as a mild burning sensation that shoots through to her back. Pt is taking Bactrim for a UTI and she thinks that the Bactrim is causing her sx. Pt c/o of dysuria stating she "feels like my pee is hot." pt has taken herself off the Bactrim. Pt with nausea last night.  Due to her h/o colon cancer and constant pain since yesterday with distension, advised pt to go to the ED. Notified Patina at office who agreed with disposition. Care advice given. Pt agreeable to go to the ED.  Reason for Disposition . [1] MILD-MODERATE pain AND [2] constant AND [3] present > 2 hours  Answer Assessment - Initial Assessment Questions 1. LOCATION: "Where does it hurt?"     Abdomen- burning sensation  2. RADIATION: "Does the pain shoot anywhere else?" (e.g., chest, back)    back 3. ONSET: "When did the pain begin?" (e.g., minutes, hours or days ago)      yesterday 4. SUDDEN: "Gradual or sudden onset?"     gradual 5. PATTERN "Does the pain come and go, or is it constant?"    - If constant: "Is it getting better, staying the same, or worsening?"      (Note: Constant means the pain never goes away completely; most serious pain is constant and it progresses)     - If intermittent: "How long does it last?" "Do you have pain now?"     (Note: Intermittent means the pain goes away completely between bouts)    constant 6. SEVERITY: "How bad is the pain?"  (e.g., Scale 1-10; mild, moderate, or severe)   - MILD (1-3): doesn't interfere with normal activities, abdomen soft and not tender to touch    - MODERATE (4-7): interferes with normal activities or awakens from sleep, tender to touch    - SEVERE (8-10): excruciating pain, doubled over, unable to do any normal activities    mild 7. RECURRENT SYMPTOM: "Have you ever had this type of abdominal pain before?" If so, ask: "When was the last time?" and "What happened that time?"     no 8. CAUSE: "What do you think is causing the abdominal pain?"     The Bactrim 9. RELIEVING/AGGRAVATING FACTORS: "What makes it better or worse?" (e.g., movement, antacids, bowel movement)     Has taken herself off the bactrim 10. OTHER SYMPTOMS: "Has there been any vomiting, diarrhea, constipation, or urine problems?"      Distended abdomen, diarrhea yesterday, burning with urination "like the pee is hot." Nauseated last night. 11. PREGNANCY: "Is there any chance you are pregnant?" "When was your last menstrual period?"       n/a  Protocols used: ABDOMINAL PAIN - [facility]

## 2017-11-07 NOTE — Telephone Encounter (Signed)
LMTCB

## 2017-11-07 NOTE — ED Provider Notes (Signed)
[facility] Emergency Department Provider Note    First MD Initiated Contact with Patient 11/07/17 1924     (approximate)  I have reviewed the triage vital signs and the nursing notes.   HISTORY  Chief Complaint Bloated   HPI Victoria Hanson is a 67 y.o. female with extensive past medical history with recent treatment for UTI presents the ER with epigastric abdominal discomfort and bloating that got worse yesterday evening and then got much better but is still having burning sensation in her epigastric region.  States that this is not her gas pain.  Is very concerned that this is something different.  She spoke to her PCP who told her come to the ER for further evaluation as she was still having some discomfort.  Denies any fevers.  No vomiting but has had some nausea.  Is still moving her bowels.  Denies any diarrhea.  She is status post cholecystectomy as well as appendectomy.  Past Medical History:  Diagnosis Date  . Anxiety   . Asthma   . Atypical chest pain    a. 2003 Cath: reportedly nl per pt [name]);  b. 04/2015 [place] Admission, neg troponin.  . Colon cancer ([place]) 1999   a. 1999: Pt had partial colectomy. Did develop lung metastasis. Had right upper lobectomy in 01/2007 then had associated chemotherapy.  Marland Kitchen COPD (chronic obstructive pulmonary disease) ([place])   . DDD (degenerative disc disease), lumbosacral   . Depression   . Echocardiogram abnormal    a. 02/2010 Echo: EF 55-60%, mild LAE, no regional wall motion abnormalities  . Elevated transaminase level    a. ? NASH;  b. 06/2015 - nl LFTs.  . GERD (gastroesophageal reflux disease)   . Hematuria   . Holter monitor, abnormal    a. 12/2009 Holter monitoring: NSR, rare PACs and PVCs.  Marland Kitchen Hypercholesterolemia   . Hyperlipidemia   . Hypertension   . Leaky heart valve    a. Per pt report - no evidence of valvular abnormalities on prior echoes.  . Lesion of skin of Right breast 10/20/2017  . Lung  metastases ([place]) 2008   a. S/P resection (lobectomy - right)  . Morbid obesity ([place])   . Nephrolithiasis   . Parkinson disease ([place]) 08/2016   [name]   Family History  Problem Relation Age of Onset  . Coronary artery disease Mother        died @ 31 of MI.  . Mental illness Mother   . Diabetes Mother   . Heart attack Father 20       MI  . Cancer Father        lung - died in his mid-70's  . Hypertension Father   . Diabetes Father   . Sudden death Brother   . Mental illness Brother   . Heart attack Brother        MI @ 6, died @ age 75.  . Breast cancer Neg Hx    Past Surgical History:  Procedure Laterality Date  . BREAST EXCISIONAL BIOPSY Right 10/07/2009   benign  . CARDIAC CATHETERIZATION  2003   negative as per pt report  . CHOLECYSTECTOMY  2000  . COLON SURGERY     Colon cancer, removed part of large colon  . COLONOSCOPY N/A 04/25/2015   Procedure: COLONOSCOPY;  Surgeon: Manya Silvas, MD;  Location: [facility] ENDOSCOPY;  Service: Endoscopy;  Laterality: N/A;  . COLONOSCOPY WITH PROPOFOL N/A 06/14/2017   Procedure: COLONOSCOPY WITH  PROPOFOL;  Surgeon: Manya Silvas, MD;  Location[facility] ENDOSCOPY;  Service: Endoscopy;  Laterality: N/A;  . LOBECTOMY  01/2007   Right lung, upper lobe right side removed  . TOTAL ABDOMINAL HYSTERECTOMY     abnormal bleeding  . TUBAL LIGATION  1980   Patient Active Problem List   Diagnosis Date Noted  . Goals of care, counseling/discussion 10/15/2017  . Parkinson's disease ([place]) 07/31/2017  . Chronic obstructive pulmonary disease ([place]) 07/31/2017  . Elevated liver function tests 08/25/2016  . Carotid stenosis 07/19/2016  . Tremor 06/01/2016  . Osteoporosis 04/08/2016  . Foot pain, bilateral 07/24/2015  . Hyperlipidemia 06/21/2015  . Other fatigue 06/21/2015  . BMI 33.0-33.9,adult 06/21/2015  . Abnormal mammogram 06/19/2015  . Urinary system disease 03/28/2015  . Hyperglycemia 03/20/2015  . Swelling of lower extremity  11/20/2014  . Health care maintenance 07/17/2014  . Hypokalemia 04/05/2013  . TIA (transient ischemic attack) 02/09/2013  . Temporary cerebral vascular dysfunction 02/09/2013  . Anxiety, mild 03/13/2012  . Depression, recurrent ([place]) 03/13/2012  . History of colon cancer 03/13/2012  . Hypertension 03/13/2012  . CKD (chronic kidney disease), stage III ([place]) 03/13/2012  . GERD (gastroesophageal reflux disease) 03/13/2012  . Acid reflux 03/13/2012  . History of malignant neoplasm metastatic to lung 03/13/2012  . Major depressive disorder with single episode 03/13/2012  . Hypercholesterolemia 02/02/2010  . Pure hypercholesterolemia 02/02/2010      Prior to Admission medications   Medication Sig Start Date End Date Taking? Authorizing Provider  ALPRAZolam Duanne Moron) 0.5 MG tablet TAKE 1/2 TABLET BY MOUTH DAILY AS NEEDED FOR ANXIETY 10/29/17   Einar Pheasant, MD  amoxicillin-clavulanate (AUGMENTIN) 875-125 MG tablet Take 1 tablet by mouth 2 (two) times daily. With food 10/29/17   McLean-Scocuzza, Nino Glow, MD  aspirin EC 81 MG tablet Take 81 mg by mouth daily.    [provider]  carbidopa-levodopa (SINEMET IR) 25-100 MG tablet Take 2 tablets by mouth 3 (three) times daily.  07/30/17   [provider]  citalopram (CELEXA) 10 MG tablet Take 1 tablet (10 mg total) by mouth daily. 10/29/17   McLean-Scocuzza, Nino Glow, MD  fluticasone Asencion Islam) 50 MCG/ACT nasal spray  08/13/17   [provider]  fluticasone furoate-vilanterol (BREO ELLIPTA) 100-25 MCG/INH AEPB Inhale 1 puff into the lungs daily. 10/18/17   Wilhelmina Mcardle, MD  fluticasone-salmeterol (ADVAIR HFA) [id number] MCG/ACT inhaler Inhale 2 puffs into the lungs 2 (two) times daily. 12/31/16   Wilhelmina Mcardle, MD  Multiple Vitamin (MULTIVITAMIN) tablet Take 1 tablet by mouth daily.    [provider]  mupirocin ointment (BACTROBAN) 2 % Apply to affected area on nipple bid 10/07/17   Einar Pheasant, MD  omeprazole  (PRILOSEC) 20 MG capsule TAKE 1 CAPSULE BY MOUTH EVERY DAY 10/30/17   Einar Pheasant, MD  polyethylene glycol powder (GLYCOLAX/MIRALAX) powder  06/03/17   [provider]  potassium chloride SA (K-DUR,KLOR-CON) 20 MEQ tablet TAKE ONE TABLET BY MOUTH TWICE A DAY 10/30/17   Einar Pheasant, MD  rosuvastatin (CRESTOR) 10 MG tablet Take 1 tablet (10 mg total) by mouth daily. 10/08/17   Einar Pheasant, MD  sulfamethoxazole-trimethoprim (BACTRIM DS) 800-160 MG tablet Take 1 tablet by mouth 2 (two) times daily. With food 11/01/17   McLean-Scocuzza, Nino Glow, MD  traZODone (DESYREL) 50 MG tablet TAKE 2 TABLETS BY MOUTH AT BEDTIME 10/04/17   Einar Pheasant, MD  triamterene-hydrochlorothiazide (MAXZIDE-25) 37.5-25 MG tablet Take 1 tablet by mouth daily. 09/04/17   Nicki Reaper,  Charlene, MD  VENTOLIN HFA 108 (90 Base) MCG/ACT inhaler INHALE 2 PUFFS BY MOUTH INTO THE LUNGS EVERY 6 HOURS AS NEEDED FOR WHEEZING 10/30/17   Einar Pheasant, MD    Allergies Macrobid [nitrofurantoin macrocrystal]; Antihistamines, diphenhydramine-type; Aspirin; Ceftin [cefuroxime axetil]; Celecoxib; Cymbalta [duloxetine hcl]; Diphenhydramine; Escitalopram; Lexapro [escitalopram oxalate]; Metronidazole; Nitrofurantoin; Zoloft [sertraline hcl]; and Ciprofloxacin    Social History Social History   Tobacco Use  . Smoking status: Passive Smoke Exposure - Never Smoker  . Smokeless tobacco: Never Used  . Tobacco comment: husband and sons smoked in her home.  Substance Use Topics  . Alcohol use: Yes    Alcohol/week: 0.6 oz    Types: 1 Glasses of wine per week    Comment: rare wine cooler.  . Drug use: No    Review of Systems Patient denies headaches, rhinorrhea, blurry vision, numbness, shortness of breath, chest pain, edema, cough, abdominal pain, nausea, vomiting, diarrhea, dysuria, fevers, rashes or hallucinations unless otherwise stated above in HPI. ____________________________________________   PHYSICAL EXAM:  VITAL  SIGNS: Vitals:   11/07/17 1722  BP: 134/67  Pulse: 74  Resp: 16  Temp: 97.7 F (36.5 C)  SpO2: 97%    Constitutional: Alert and oriented. Well appearing and in no acute distress. Eyes: Conjunctivae are normal.  Head: Atraumatic. Nose: No congestion/rhinnorhea. Mouth/Throat: Mucous membranes are moist.   Neck: No stridor. Painless ROM.  Cardiovascular: Normal rate, regular rhythm. Grossly normal heart sounds.  Good peripheral circulation. Respiratory: Normal respiratory effort.  No retractions. Lungs CTAB. Gastrointestinal: Soft and nontender. No distention. No abdominal bruits. No CVA tenderness. Genitourinary:  Musculoskeletal: No lower extremity tenderness nor edema.  No joint effusions. Neurologic:  Normal speech and language. No gross focal neurologic deficits are appreciated. No facial droop Skin:  Skin is warm, dry and intact. No rash noted. Psychiatric: Mood and affect are normal. Speech and behavior are normal.  ____________________________________________   LABS (all labs ordered are listed, but only abnormal results are displayed)  Results for orders placed or performed during the hospital encounter of 11/07/17 (from the past 24 hour(s))  Lipase, blood     Status: None   Collection Time: 11/07/17  5:59 PM  Result Value Ref Range   Lipase 24 11 - 51 U/L  Comprehensive metabolic panel     Status: Abnormal   Collection Time: 11/07/17  5:59 PM  Result Value Ref Range   Sodium 133 (L) 135 - 145 mmol/L   Potassium 3.7 3.5 - 5.1 mmol/L   Chloride 100 (L) 101 - 111 mmol/L   CO2 21 (L) 22 - 32 mmol/L   Glucose, Bld 139 (H) 65 - 99 mg/dL   BUN 19 6 - 20 mg/dL   Creatinine, Ser 1.56 (H) 0.44 - 1.00 mg/dL   Calcium 8.9 8.9 - 10.3 mg/dL   Total Protein 7.4 6.5 - 8.1 g/dL   Albumin 3.9 3.5 - 5.0 g/dL   AST 19 15 - 41 U/L   ALT <5 (L) 14 - 54 U/L   Alkaline Phosphatase 74 38 - 126 U/L   Total Bilirubin 0.4 0.3 - 1.2 mg/dL   GFR calc non Af Amer 33 (L) >60 mL/min   GFR  calc Af Amer 39 (L) >60 mL/min   Anion gap 12 5 - 15  CBC     Status: Abnormal   Collection Time: 11/07/17  5:59 PM  Result Value Ref Range   WBC 8.5 3.6 - 11.0 K/uL   RBC 4.10 3.80 -  5.20 MIL/uL   Hemoglobin 11.7 (L) 12.0 - 16.0 g/dL   HCT 35.0 35.0 - 47.0 %   MCV 85.2 80.0 - 100.0 fL   MCH 28.5 26.0 - 34.0 pg   MCHC 33.4 32.0 - 36.0 g/dL   RDW 13.5 11.5 - 14.5 %   Platelets 225 150 - 440 K/uL  Urinalysis, Complete w Microscopic     Status: Abnormal   Collection Time: 11/07/17  5:59 PM  Result Value Ref Range   Color, Urine STRAW (A) YELLOW   APPearance CLEAR (A) CLEAR   Specific Gravity, Urine 1.006 1.005 - 1.030   pH 6.0 5.0 - 8.0   Glucose, UA NEGATIVE NEGATIVE mg/dL   Hgb urine dipstick SMALL (A) NEGATIVE   Bilirubin Urine NEGATIVE NEGATIVE   Ketones, ur NEGATIVE NEGATIVE mg/dL   Protein, ur NEGATIVE NEGATIVE mg/dL   Nitrite NEGATIVE NEGATIVE   Leukocytes, UA TRACE (A) NEGATIVE   RBC / HPF 0-5 0 - 5 RBC/hpf   WBC, UA 0-5 0 - 5 WBC/hpf   Bacteria, UA RARE (A) NONE SEEN   Squamous Epithelial / LPF 0-5 0 - 5   ____________________________________________ ____________________________________________  RADIOLOGY  I personally reviewed all radiographic images ordered to evaluate for the above acute complaints and reviewed radiology reports and findings.  These findings were personally discussed with the patient.  Please see medical record for radiology report.  ____________________________________________   PROCEDURES  Procedure(s) performed:  Procedures    Critical Care performed: no ____________________________________________   INITIAL IMPRESSION / ASSESSMENT AND PLAN / ED COURSE  Pertinent labs & imaging results that were available during my care of the patient were reviewed by me and considered in my medical decision making (see chart for details).  DDX: sbo, enteritis, uti, gastritis, pud, colitis  Victoria Hanson is a 67 y.o. who presents to the ED  with nausea and bloating that seems to is resolved.  Her abdominal exam soft and benign.  Given her recent antibiotic use and history of colon cancer will order CT imaging both to exclude evidence of megacolon, diverticulitis SBO or mass.  CT imaging showed no acute abnormality.  No evidence of diverticulitis.  Do suspect some component of gaseous distention and possible nausea induced by antibiotics.  Her urinalysis does appear to improved and her symptoms of her UTI are also improved.  At this point do believe patient stable and appropriate for outpatient follow-up.      As part of my medical decision making, I reviewed the following data within the [place] notes reviewed and incorporated, Labs reviewed, notes from prior ED visits .   ____________________________________________   FINAL CLINICAL IMPRESSION(S) / ED DIAGNOSES  Final diagnoses:  Nausea      NEW MEDICATIONS STARTED DURING THIS VISIT:  New Prescriptions   No medications on file     Note:  This document was prepared using Dragon voice recognition software and may include unintentional dictation errors.    Merlyn Lot, MD 11/07/17 2049

## 2017-11-08 NOTE — Telephone Encounter (Signed)
Patient was seen and discharged from the ED yesterday.

## 2017-11-10 DIAGNOSIS — N6452 Nipple discharge: Secondary | ICD-10-CM | POA: Insufficient documentation

## 2017-11-12 ENCOUNTER — Ambulatory Visit: Payer: Medicare HMO | Admitting: Pulmonary Disease

## 2017-11-26 ENCOUNTER — Ambulatory Visit: Payer: Medicare HMO | Attending: Pulmonary Disease

## 2017-11-26 DIAGNOSIS — R0609 Other forms of dyspnea: Secondary | ICD-10-CM | POA: Diagnosis not present

## 2017-11-26 DIAGNOSIS — J449 Chronic obstructive pulmonary disease, unspecified: Secondary | ICD-10-CM

## 2017-11-26 DIAGNOSIS — J45909 Unspecified asthma, uncomplicated: Secondary | ICD-10-CM | POA: Diagnosis not present

## 2017-11-27 IMAGING — DX DG TOE 5TH 2+V*R*
3 series · 3 of 3 positions shown · non-contrast
Comparison: None.

CLINICAL DATA: Right fifth toe pain.

EXAM:
RIGHT FIFTH TOE

[toes ap]
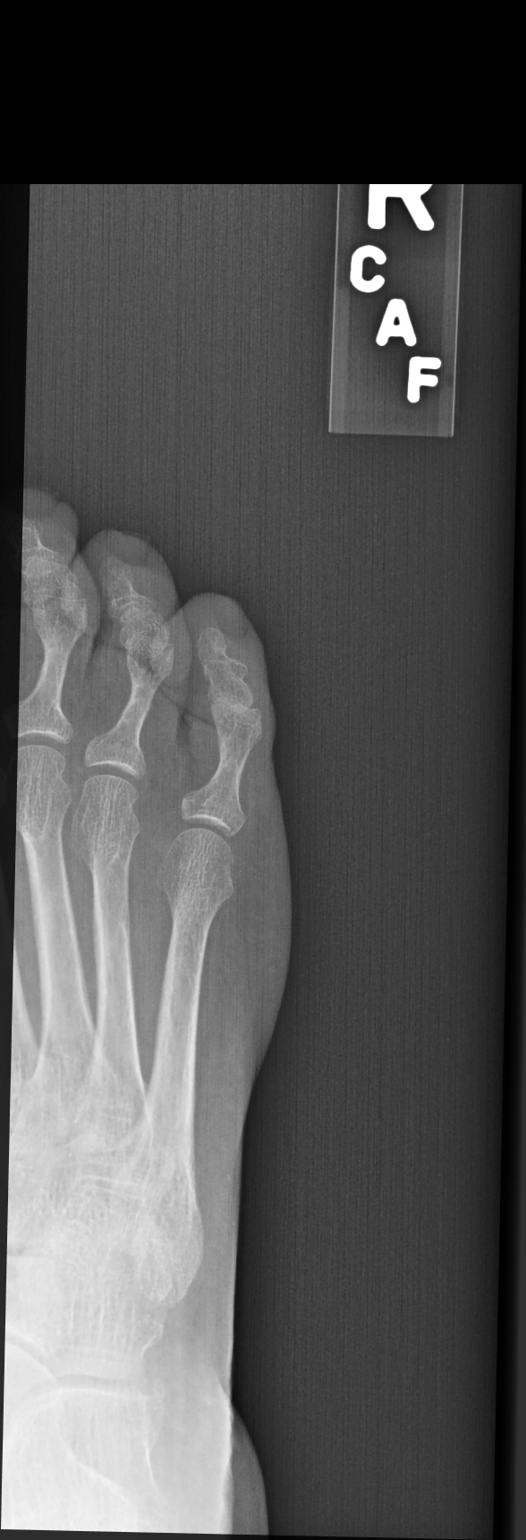

[toes obl (oblique)]
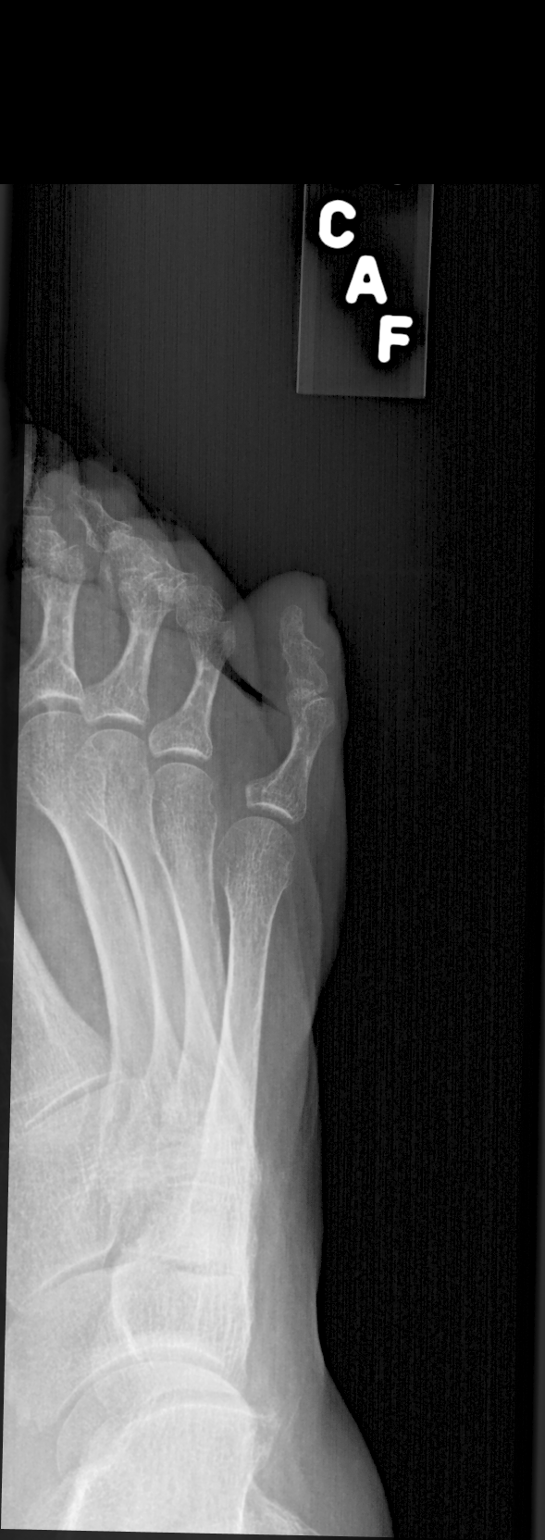

[toes lat]
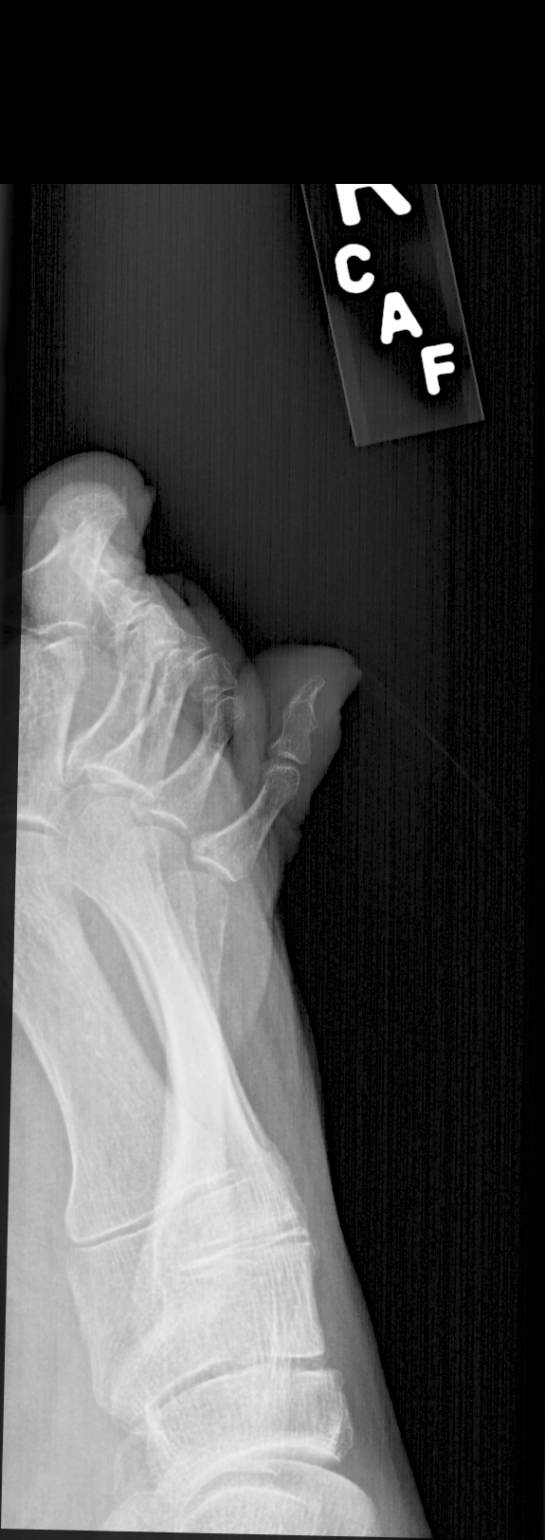

[3 of 3 positions shown; findings below may reference images not displayed]

FINDINGS: There is no evidence of fracture or dislocation. There is no
evidence of arthropathy or other focal bone abnormality. Soft
tissues are unremarkable.
IMPRESSION: Normal right fifth toe.

## 2017-11-28 ENCOUNTER — Other Ambulatory Visit: Payer: Self-pay | Admitting: Internal Medicine

## 2017-12-03 ENCOUNTER — Encounter: Payer: Self-pay | Admitting: Pulmonary Disease

## 2017-12-03 ENCOUNTER — Ambulatory Visit: Payer: Medicare HMO | Admitting: Pulmonary Disease

## 2017-12-03 VITALS — BP 128/78 | HR 71 | Ht 63.0 in | Wt 193.0 lb

## 2017-12-03 DIAGNOSIS — J449 Chronic obstructive pulmonary disease, unspecified: Secondary | ICD-10-CM

## 2017-12-03 MED ORDER — FLUTICASONE-SALMETEROL 45-21 MCG/ACT IN AERO: 2.0000 | INHALATION_SPRAY | Freq: Two times a day (BID) | RESPIRATORY_TRACT | 12 refills | Status: DC

## 2017-12-03 NOTE — Patient Instructions (Signed)
Continue Advair inhaler-2 inhalations twice a day.  I have changed the dosage to a lower strength inhaler  Continue albuterol inhaler as needed Follow-up in 6 months or sooner as needed

## 2017-12-04 NOTE — Progress Notes (Signed)
PULMONARY OFFICE FOLLOW UP NOTE  Requesting MD/Service: Eliezer Lofts, NP Date of initial consultation: 07/06/16 Reason for consultation: COPD  PT PROFILE: 67 y.o.F never smoker but with significant second hand smoke exposure and prior employment in [employer]. S/P RUL resection in 2008 for lung cancer. Carries diagnosis of COPD. Referred for eval of class II-III dyspnea.  DATA: Echocardiogram 06/21/15: normal LVEF, mildly dilated LA. RVSP est 37 mmHg CT chest 03/10/16: No evidence of pulmonary embolism. No acute findings in the thorax to account for the patient's symptoms. Status post right upper lobectomy PFT 08/07/16: FVC:  2.43L (87 %pred), FEV1:  1.61L (79 %pred), FEV1/FVC: 66% , TLC: 4.68 L (101 %pred), DLCO 61 %pred, DLCO/VA 110% PFT 11/26/17: FVC: 2.28 L (83 %pred), FEV1: 1.65 L (75 %pred), FEV1/FVC: 72%, TLC: 4.35 L (94 %pred), DLCO 59 %pred, DLCO/VA 105%  INTERVAL:  Last visit 10/08/2017.  No major events since that time.  SUBJ:  This is a scheduled office follow-up.  She is here to review results of repeat PFTs as documented above.  She was to get a chest x-ray but this was performed elsewhere and she reports to me that it was normal.  She is presently on Advair but continues to use it inconsistently.  She continues to have concerns about dysphonia and requests the "lowest dose" of Advair.  She is using her albuterol inhaler 1-2 times per day. She continues to deny exertional chest pain, orthopnea, paroxysmal nocturnal dyspnea, lower extremity edema, cough and hemoptysis.   Vitals:   12/03/17 1025 12/03/17 1027  BP:  128/78  Pulse:  71  SpO2:  98%  Weight: 193 lb (87.5 kg)   Height: 5\' 3"  (1.6 m)   Room air   EXAM:  Gen: NAD HEENT: WNL Neck: No JVD Lungs: No wheezes or other adventitious sounds Cardiovascular: Regular, no M Abdomen: Soft, NT, NABS Ext: No C/C/E Neuro: No focal deficits Skin: Limited exam, no lesions noted    DATA:   BMP Latest Ref Rng & Units  11/07/2017 10/15/2017 10/07/2017  Glucose 65 - 99 mg/dL 139(H) 110(H) 93  BUN 6 - 20 mg/dL 19 23(H) 20  Creatinine 0.44 - 1.00 mg/dL 1.56(H) 1.16(H) 1.14  Sodium 135 - 145 mmol/L 133(L) 136 140  Potassium 3.5 - 5.1 mmol/L 3.7 4.3 4.4  Chloride 101 - 111 mmol/L 100(L) 104 105  CO2 22 - 32 mmol/L 21(L) 23 25  Calcium 8.9 - 10.3 mg/dL 8.9 9.2 9.2    CBC Latest Ref Rng & Units 11/07/2017 10/15/2017 02/28/2017  WBC 3.6 - 11.0 K/uL 8.5 7.3 9.2  Hemoglobin 12.0 - 16.0 g/dL 11.7(L) 11.8(L) 12.0  Hematocrit 35.0 - 47.0 % 35.0 34.5(L) 34.3(L)  Platelets 150 - 440 K/uL 225 224 266    CXR: Recent film reportedly normal.  Not available for my review  IMPRESSION:     ICD-10-CM   1. COPD, mild ([place]) J44.9   2. Chronic obstructive asthma ([facility]) J44.9      PLAN:  Continue Advair HFA inhaler, 45-21, 2 inhalations twice daily Continue albuterol inhaler as needed Follow-up in 6 months or sooner as needed   Merton Border, MD PCCM service Mobile [phone] Pager [phone] 12/04/2017 11:45 AM

## 2017-12-16 ENCOUNTER — Other Ambulatory Visit: Payer: Self-pay | Admitting: Internal Medicine

## 2017-12-17 ENCOUNTER — Other Ambulatory Visit: Payer: Self-pay | Admitting: Internal Medicine

## 2018-01-20 ENCOUNTER — Encounter: Payer: Self-pay | Admitting: Internal Medicine

## 2018-01-20 ENCOUNTER — Ambulatory Visit (INDEPENDENT_AMBULATORY_CARE_PROVIDER_SITE_OTHER): Payer: Medicare HMO | Admitting: Internal Medicine

## 2018-01-20 VITALS — BP 122/64 | HR 73 | Temp 97.9°F | Resp 18 | Wt 195.4 lb

## 2018-01-20 DIAGNOSIS — Z8589 Personal history of malignant neoplasm of other organs and systems: Secondary | ICD-10-CM

## 2018-01-20 DIAGNOSIS — N183 Chronic kidney disease, stage 3 unspecified: Secondary | ICD-10-CM

## 2018-01-20 DIAGNOSIS — Z1231 Encounter for screening mammogram for malignant neoplasm of breast: Secondary | ICD-10-CM

## 2018-01-20 DIAGNOSIS — Z85118 Personal history of other malignant neoplasm of bronchus and lung: Secondary | ICD-10-CM | POA: Diagnosis not present

## 2018-01-20 DIAGNOSIS — Z1239 Encounter for other screening for malignant neoplasm of breast: Secondary | ICD-10-CM

## 2018-01-20 DIAGNOSIS — N939 Abnormal uterine and vaginal bleeding, unspecified: Secondary | ICD-10-CM | POA: Diagnosis not present

## 2018-01-20 DIAGNOSIS — R945 Abnormal results of liver function studies: Secondary | ICD-10-CM | POA: Diagnosis not present

## 2018-01-20 DIAGNOSIS — F339 Major depressive disorder, recurrent, unspecified: Secondary | ICD-10-CM

## 2018-01-20 DIAGNOSIS — F419 Anxiety disorder, unspecified: Secondary | ICD-10-CM

## 2018-01-20 DIAGNOSIS — G20A1 Parkinson's disease without dyskinesia, without mention of fluctuations: Secondary | ICD-10-CM

## 2018-01-20 DIAGNOSIS — R739 Hyperglycemia, unspecified: Secondary | ICD-10-CM

## 2018-01-20 DIAGNOSIS — J449 Chronic obstructive pulmonary disease, unspecified: Secondary | ICD-10-CM

## 2018-01-20 DIAGNOSIS — K219 Gastro-esophageal reflux disease without esophagitis: Secondary | ICD-10-CM

## 2018-01-20 DIAGNOSIS — Z85038 Personal history of other malignant neoplasm of large intestine: Secondary | ICD-10-CM

## 2018-01-20 DIAGNOSIS — I10 Essential (primary) hypertension: Secondary | ICD-10-CM | POA: Diagnosis not present

## 2018-01-20 DIAGNOSIS — N644 Mastodynia: Secondary | ICD-10-CM

## 2018-01-20 DIAGNOSIS — R7989 Other specified abnormal findings of blood chemistry: Secondary | ICD-10-CM

## 2018-01-20 DIAGNOSIS — G20 Parkinson's disease: Secondary | ICD-10-CM

## 2018-01-20 DIAGNOSIS — E7800 Pure hypercholesterolemia, unspecified: Secondary | ICD-10-CM

## 2018-01-20 NOTE — Progress Notes (Signed)
Patient ID: Victoria Hanson, female   DOB: [birth year], 67 y.o.   MRN: [id number]   Subjective:    Patient ID: Victoria Hanson, female    DOB: 10-14-1950, 67 y.o.   MRN: [id number]  HPI  Patient here for a scheduled follow up.  She saw Dr Alva Garnet 12/03/17 for f/u COPD.  Felt stable.  Recommended continuing advair and albuterol.  Breathing stable.  She was recently evaluated for depression.  Started on celexa.  Doing well on this medication.  Feels is doing well.  Takes xanax.  Helps.  Overall she feels she is handling things well.  No ches tpain.  No sob.  No acid reflux.  No abdominal pain.  Bowels moving.  Recently evaluated and found to have uti.  Wants urine rechecked to confirm has cleared.  She does report some breast discomfort 9:00 right breast.  No palpable nodule.     Past Medical History:  Diagnosis Date  . Anxiety   . Asthma   . Atypical chest pain    a. 2003 Cath: reportedly nl per pt [name]);  b. 04/2015 [facility] Admission, neg troponin.  . Colon cancer ([place]) 1999   a. 1999: Pt had partial colectomy. Did develop lung metastasis. Had right upper lobectomy in 01/2007 then had associated chemotherapy.  Marland Kitchen COPD (chronic obstructive pulmonary disease) ([place])   . DDD (degenerative disc disease), lumbosacral   . Depression   . Echocardiogram abnormal    a. 02/2010 Echo: EF 55-60%, mild LAE, no regional wall motion abnormalities  . Elevated transaminase level    a. ? NASH;  b. 06/2015 - nl LFTs.  . GERD (gastroesophageal reflux disease)   . Hematuria   . Holter monitor, abnormal    a. 12/2009 Holter monitoring: NSR, rare PACs and PVCs.  Marland Kitchen Hypercholesterolemia   . Hyperlipidemia   . Hypertension   . Leaky heart valve    a. Per pt report - no evidence of valvular abnormalities on prior echoes.  . Lesion of skin of Right breast 10/20/2017  . Lung metastases ([place]) 2008   a. S/P resection (lobectomy - right)  . Morbid obesity ([place])   . Nephrolithiasis   . Parkinson disease ([place]) 08/2016     [name]   Past Surgical History:  Procedure Laterality Date  . BREAST EXCISIONAL BIOPSY Right 10/07/2009   benign  . CARDIAC CATHETERIZATION  2003   negative as per pt report  . CHOLECYSTECTOMY  2000  . COLON SURGERY     Colon cancer, removed part of large colon  . COLONOSCOPY N/A 04/25/2015   Procedure: COLONOSCOPY;  Surgeon: Manya Silvas, MD;  Location: [facility] ENDOSCOPY;  Service: Endoscopy;  Laterality: N/A;  . COLONOSCOPY WITH PROPOFOL N/A 06/14/2017   Procedure: COLONOSCOPY WITH PROPOFOL;  Surgeon: Manya Silvas, MD;  Location: [facility] ENDOSCOPY;  Service: Endoscopy;  Laterality: N/A;  . LOBECTOMY  01/2007   Right lung, upper lobe right side removed  . TOTAL ABDOMINAL HYSTERECTOMY     abnormal bleeding  . TUBAL LIGATION  1980   Family History  Problem Relation Age of Onset  . Coronary artery disease Mother        died @ 41 of MI.  . Mental illness Mother   . Diabetes Mother   . Heart attack Father 14       MI  . Cancer Father        lung - died in his mid-70's  . Hypertension Father   . Diabetes Father   .  Sudden death Brother   . Mental illness Brother   . Heart attack Brother        MI @ 30, died @ age 36.  . Breast cancer Neg Hx    Social History   Socioeconomic History  . Marital status: Widowed    Spouse name: Not on file  . Number of children: Not on file  . Years of education: Not on file  . Highest education level: Not on file  Occupational History  . Not on file  Social Needs  . Financial resource strain: Not on file  . Food insecurity:    Worry: Not on file    Inability: Not on file  . Transportation needs:    Medical: Not on file    Non-medical: Not on file  Tobacco Use  . Smoking status: Passive Smoke Exposure - Never Smoker  . Smokeless tobacco: Never Used  . Tobacco comment: husband and sons smoked in her home.  Substance and Sexual Activity  . Alcohol use: Yes    Alcohol/week: 1.0 standard drinks    Types: 1 Glasses of wine per  week    Comment: rare wine cooler.  . Drug use: No  . Sexual activity: Never  Lifestyle  . Physical activity:    Days per week: Not on file    Minutes per session: Not on file  . Stress: Not on file  Relationships  . Social connections:    Talks on phone: Not on file    Gets together: Not on file    Attends religious service: Not on file    Active member of club or organization: Not on file    Attends meetings of clubs or organizations: Not on file    Relationship status: Not on file  Other Topics Concern  . Not on file  Social History Narrative   Widowed.   Disabled (colon/lung CA), retired.   Lives in [place] with her dtr, grand-dtr, and son.   Active.   Gets regular exercise, walks.    Outpatient Encounter Medications as of 01/20/2018  Medication Sig  . ALPRAZolam (XANAX) 0.5 MG tablet TAKE 1/2 TABLET BY MOUTH DAILY AS NEEDED FOR ANXIETY  . aspirin EC 81 MG tablet Take 81 mg by mouth daily.  . carbidopa-levodopa (SINEMET IR) 25-100 MG tablet Take 2 tablets by mouth 3 (three) times daily.   . citalopram (CELEXA) 10 MG tablet Take 1 tablet (10 mg total) by mouth daily.  . fluticasone-salmeterol (ADVAIR HFA) 45-21 MCG/ACT inhaler Inhale 2 puffs into the lungs 2 (two) times daily.  . Multiple Vitamin (MULTIVITAMIN) tablet Take 1 tablet by mouth daily.  Marland Kitchen omeprazole (PRILOSEC) 20 MG capsule TAKE 1 CAPSULE BY MOUTH EVERY DAY  . polyethylene glycol powder (GLYCOLAX/MIRALAX) powder   . potassium chloride SA (K-DUR,KLOR-CON) 20 MEQ tablet TAKE ONE TABLET BY MOUTH TWICE A DAY  . traZODone (DESYREL) 50 MG tablet TAKE 2 TABLETS BY MOUTH AT BEDTIME  . triamterene-hydrochlorothiazide (MAXZIDE-25) 37.5-25 MG tablet Take 1 tablet by mouth daily.  . VENTOLIN HFA 108 (90 Base) MCG/ACT inhaler INHALE 2 PUFFS BY MOUTH INTO THE LUNGS EVERY 6 HOURS AS NEEDED FOR WHEEZING  . [DISCONTINUED] rosuvastatin (CRESTOR) 10 MG tablet Take 1 tablet (10 mg total) by mouth daily.   No  facility-administered encounter medications on file as of 01/20/2018.     Review of Systems  Constitutional: Negative for appetite change and unexpected weight change.  HENT: Negative for congestion and sinus pressure.  Respiratory: Negative for cough, chest tightness and shortness of breath.   Cardiovascular: Negative for chest pain, palpitations and leg swelling.  Gastrointestinal: Negative for abdominal pain, diarrhea, nausea and vomiting.  Genitourinary: Negative for difficulty urinating and dysuria.  Musculoskeletal: Negative for joint swelling and myalgias.  Skin: Negative for color change and rash.  Neurological: Negative for dizziness, light-headedness and headaches.  Psychiatric/Behavioral: Negative for agitation and dysphoric mood.       Citalopram helping.         Objective:    Physical Exam  Constitutional: She appears well-developed and well-nourished. No distress.  HENT:  Nose: Nose normal.  Mouth/Throat: Oropharynx is clear and moist.  Neck: Neck supple. No thyromegaly present.  Cardiovascular: Normal rate and regular rhythm.  Pulmonary/Chest: Breath sounds normal. No respiratory distress. She has no wheezes.  Breasts:  No nipple discharge or nipple retraction present.  Could not appreciate distinct nodules or axillary adenopathy.  Some discomfort 9:00 right breast.    Abdominal: Soft. Bowel sounds are normal. There is no tenderness.  Musculoskeletal: She exhibits no edema or tenderness.  Lymphadenopathy:    She has no cervical adenopathy.  Skin: No rash noted. No erythema.  Psychiatric: She has a normal mood and affect. Her behavior is normal.    BP 122/64 (BP Location: Left Arm, Patient Position: Sitting, Cuff Size: Normal)   Pulse 73   Temp 97.9 F (36.6 C) (Oral)   Resp 18   Wt 195 lb 6.4 oz (88.6 kg)   SpO2 96%   BMI 34.61 kg/m  Wt Readings from Last 3 Encounters:  01/20/18 195 lb 6.4 oz (88.6 kg)  12/03/17 193 lb (87.5 kg)  11/07/17 193 lb (87.5  kg)     Lab Results  Component Value Date   WBC 8.1 01/20/2018   HGB 11.6 (L) 01/20/2018   HCT 34.9 (L) 01/20/2018   PLT 230.0 01/20/2018   GLUCOSE 98 01/20/2018   CHOL 213 (H) 10/07/2017   TRIG 134.0 10/07/2017   HDL 55.70 10/07/2017   LDLDIRECT 163.4 12/31/2012   LDLCALC 130 (H) 10/07/2017   ALT 4 01/20/2018   AST 24 01/20/2018   NA 138 01/20/2018   K 3.7 01/20/2018   CL 102 01/20/2018   CREATININE 1.36 (H) 01/20/2018   BUN 20 01/20/2018   CO2 29 01/20/2018   TSH 1.66 01/20/2018   INR 1.0 12/12/2012   HGBA1C 6.2 01/20/2018    Ct Abdomen Pelvis Wo Contrast  Result Date: 11/07/2017 CLINICAL DATA:  Pt to ED c/o abd bloating that started yesterday, has gone down today, states burning sensation in abd. Is being treated for UTI and stopped taking bactrim yesterday thinking that was the cause. EXAM: CT ABDOMEN AND PELVIS WITHOUT CONTRAST TECHNIQUE: Multidetector CT imaging of the abdomen and pelvis was performed following the standard protocol without IV contrast. COMPARISON:  02/16/2015 FINDINGS: Lower chest: No acute abnormality. Hepatobiliary: Cholecystectomy.  The liver is homogeneous. Pancreas: Unremarkable. No pancreatic ductal dilatation or surrounding inflammatory changes. Spleen: Normal in size without focal abnormality. Adrenals/Urinary Tract: Adrenal glands are normal in appearance. Kidneys are symmetric in size. No hydronephrosis or urinary tract obstruction. No nephrolithiasis. Stomach/Bowel: The stomach and small bowel loops are normal in appearance. There are scattered colonic diverticula. No acute diverticulitis. The appendix is well seen and has a normal appearance. Vascular/Lymphatic: There is dense atherosclerotic calcification of the abdominal aorta not associated with aneurysm. Reproductive: Hysterectomy.  No adnexal mass. Other: No free pelvic fluid. Anterior abdominal wall fat containing  hernia is identified in the suprapubic region. A second small fat containing  hernia is identified in the paraumbilical region. A small complex fat containing hernia is identified in the infraumbilical region slightly to the RIGHT of midline. Musculoskeletal: No acute or significant osseous findings. IMPRESSION: 1. No nephrolithiasis or urinary tract obstruction. 2. Cholecystectomy, hysterectomy. 3. Normal appendix. 4. Colonic diverticulosis without acute diverticulitis. 5.  Aortic atherosclerosis.  (ICD10-I70.0) 6. Stable, multiple small fat containing hernias without associated obstruction. Electronically Signed   By: Nolon Nations M.D.   On: 11/07/2017 20:22       Assessment & Plan:   Problem List Items Addressed This Visit    Acid reflux    Controlled on omeprazole.        Anxiety, mild    Takes xanax.  Now on citalopram.  Stable.       Breast pain, right    Right breast pain as outlined.  (9:00).  Schedule diagnostic mammogram.        Relevant Orders   MM DIAG BREAST TOMO BILATERAL   US BREAST LTD UNI RIGHT INC AXILLA   US BREAST LTD UNI LEFT INC AXILLA   Chronic obstructive pulmonary disease ([place])    Followed by pulmonary as outlined.  Breathing stable.       CKD (chronic kidney disease), stage III ([place])    Follow met b.        Relevant Orders   Basic metabolic panel (Completed)   Urinalysis, Routine w reflex microscopic (Completed)   Urine Culture (Completed)   Depression, recurrent (H[facility])    On citalopram.  Stable.        Elevated liver function tests   Relevant Orders   Hepatic function panel (Completed)   History of colon cancer    Colonoscopy 06/14/17 - hyperplastic polyp (x2).        History of malignant neoplasm metastatic to lung   Relevant Orders   MM DIAG BREAST TOMO BILATERAL   US BREAST LTD UNI RIGHT INC AXILLA   US BREAST LTD UNI LEFT INC AXILLA   Hypercholesterolemia    Not on crestor now.  Did not tolerate.  Low cholesterol diet and exercise.  Follow lipid panel.        Hyperglycemia    Low carb diet and exercise.   Follow met b and a1c.        Relevant Orders   Hemoglobin A1c (Completed)   Hypertension    Blood pressure under good control.  Continue same medication regimen.  Follow pressures.  Follow metabolic panel.        Relevant Orders   CBC with Differential/Platelet (Completed)   TSH (Completed)   Parkinson's disease ([place])    On sinemet.  Followed by neurology.  Stable.        Other Visit Diagnoses    Breast cancer screening    -  Primary   Relevant Orders   MM DIAG BREAST TOMO BILATERAL   US BREAST LTD UNI RIGHT INC AXILLA   US BREAST LTD UNI LEFT INC AXILLA       Einar Pheasant, MD

## 2018-01-21 LAB — CBC WITH DIFFERENTIAL/PLATELET
BASOS ABS: 0.1 10*3/uL (ref 0.0–0.1)
Basophils Relative: 1.3 % (ref 0.0–3.0)
EOS ABS: 0.1 10*3/uL (ref 0.0–0.7)
Eosinophils Relative: 1.1 % (ref 0.0–5.0)
HCT: 34.9 % — ABNORMAL LOW (ref 36.0–46.0)
Hemoglobin: 11.6 g/dL — ABNORMAL LOW (ref 12.0–15.0)
LYMPHS ABS: 1.9 10*3/uL (ref 0.7–4.0)
LYMPHS PCT: 23.7 % (ref 12.0–46.0)
MCHC: 33.2 g/dL (ref 30.0–36.0)
MCV: 85 fl (ref 78.0–100.0)
MONOS PCT: 6.7 % (ref 3.0–12.0)
Monocytes Absolute: 0.5 10*3/uL (ref 0.1–1.0)
NEUTROS PCT: 67.2 % (ref 43.0–77.0)
Neutro Abs: 5.4 10*3/uL (ref 1.4–7.7)
Platelets: 230 10*3/uL (ref 150.0–400.0)
RBC: 4.1 Mil/uL (ref 3.87–5.11)
RDW: 13.9 % (ref 11.5–15.5)
WBC: 8.1 10*3/uL (ref 4.0–10.5)

## 2018-01-21 LAB — BASIC METABOLIC PANEL
BUN: 20 mg/dL (ref 6–23)
CHLORIDE: 102 meq/L (ref 96–112)
CO2: 29 meq/L (ref 19–32)
Calcium: 9.6 mg/dL (ref 8.4–10.5)
Creatinine, Ser: 1.36 mg/dL — ABNORMAL HIGH (ref 0.40–1.20)
GFR: 41.08 mL/min — AB (ref >60.00)
GLUCOSE: 98 mg/dL (ref 70–99)
POTASSIUM: 3.7 meq/L (ref 3.5–5.1)
SODIUM: 138 meq/L (ref 135–145)

## 2018-01-21 LAB — URINE CULTURE
MICRO NUMBER: 90953013
Result:: NO GROWTH
SPECIMEN QUALITY: ADEQUATE

## 2018-01-21 LAB — HEPATIC FUNCTION PANEL
ALK PHOS: 85 U/L (ref 39–117)
ALT: 4 U/L (ref 0–35)
AST: 24 U/L (ref 0–37)
Albumin: 4 g/dL (ref 3.5–5.2)
BILIRUBIN DIRECT: 0.1 mg/dL (ref 0.0–0.3)
TOTAL PROTEIN: 7.2 g/dL (ref 6.0–8.3)
Total Bilirubin: 0.4 mg/dL (ref 0.2–1.2)

## 2018-01-21 LAB — URINALYSIS, ROUTINE W REFLEX MICROSCOPIC
BILIRUBIN URINE: NEGATIVE
HGB URINE DIPSTICK: NEGATIVE
Ketones, ur: NEGATIVE
NITRITE: NEGATIVE
RBC / HPF: NONE SEEN (ref 0–?)
Specific Gravity, Urine: 1.015 (ref 1.000–1.030)
Total Protein, Urine: NEGATIVE
Urine Glucose: NEGATIVE
Urobilinogen, UA: 0.2 (ref 0.0–1.0)
pH: 7 (ref 5.0–8.0)

## 2018-01-21 LAB — TSH: TSH: 1.66 u[IU]/mL (ref 0.35–4.50)

## 2018-01-21 LAB — HEMOGLOBIN A1C: Hgb A1c MFr Bld: 6.2 % (ref 4.6–6.5)

## 2018-01-25 ENCOUNTER — Encounter: Payer: Self-pay | Admitting: Internal Medicine

## 2018-01-25 DIAGNOSIS — N644 Mastodynia: Secondary | ICD-10-CM | POA: Insufficient documentation

## 2018-01-25 NOTE — Assessment & Plan Note (Signed)
Low carb diet and exercise.  Follow met b and a1c.   

## 2018-01-25 NOTE — Assessment & Plan Note (Signed)
Controlled on omeprazole.   

## 2018-01-25 NOTE — Assessment & Plan Note (Signed)
Blood pressure under good control.  Continue same medication regimen.  Follow pressures.  Follow metabolic panel.   

## 2018-01-25 NOTE — Assessment & Plan Note (Signed)
On citalopram.  Stable.

## 2018-01-25 NOTE — Assessment & Plan Note (Signed)
Colonoscopy 06/14/17 - hyperplastic polyp (x2).

## 2018-01-25 NOTE — Assessment & Plan Note (Signed)
Follow met b.

## 2018-01-25 NOTE — Assessment & Plan Note (Signed)
Right breast pain as outlined.  (9:00).  Schedule diagnostic mammogram.

## 2018-01-25 NOTE — Assessment & Plan Note (Signed)
Takes xanax.  Now on citalopram.  Stable.

## 2018-01-25 NOTE — Assessment & Plan Note (Signed)
Not on crestor now.  Did not tolerate.  Low cholesterol diet and exercise.  Follow lipid panel.

## 2018-01-25 NOTE — Assessment & Plan Note (Signed)
On sinemet.  Followed by neurology.  Stable.

## 2018-01-25 NOTE — Assessment & Plan Note (Signed)
Followed by pulmonary as outlined.  Breathing stable.

## 2018-01-27 ENCOUNTER — Other Ambulatory Visit: Payer: Self-pay | Admitting: Internal Medicine

## 2018-02-04 ENCOUNTER — Telehealth: Payer: Self-pay | Admitting: *Deleted

## 2018-02-04 ENCOUNTER — Inpatient Hospital Stay: Payer: Medicare HMO | Admitting: Urgent Care

## 2018-02-04 ENCOUNTER — Other Ambulatory Visit: Payer: Self-pay | Admitting: Internal Medicine

## 2018-02-04 DIAGNOSIS — F339 Major depressive disorder, recurrent, unspecified: Secondary | ICD-10-CM

## 2018-02-06 ENCOUNTER — Ambulatory Visit
Admission: RE | Admit: 2018-02-06 | Discharge: 2018-02-06 | Disposition: A | Discharge status: Home / Self Care | Payer: Medicare HMO | Source: Ambulatory Visit | Attending: Internal Medicine | Admitting: Internal Medicine

## 2018-02-06 DIAGNOSIS — Z1239 Encounter for other screening for malignant neoplasm of breast: Secondary | ICD-10-CM

## 2018-02-06 DIAGNOSIS — N644 Mastodynia: Secondary | ICD-10-CM

## 2018-02-06 DIAGNOSIS — Z8589 Personal history of malignant neoplasm of other organs and systems: Secondary | ICD-10-CM

## 2018-02-06 DIAGNOSIS — Z85118 Personal history of other malignant neoplasm of bronchus and lung: Secondary | ICD-10-CM | POA: Diagnosis not present

## 2018-02-06 DIAGNOSIS — R928 Other abnormal and inconclusive findings on diagnostic imaging of breast: Secondary | ICD-10-CM | POA: Diagnosis not present

## 2018-02-06 DIAGNOSIS — Z1231 Encounter for screening mammogram for malignant neoplasm of breast: Secondary | ICD-10-CM | POA: Diagnosis not present

## 2018-02-17 ENCOUNTER — Other Ambulatory Visit: Payer: Medicare HMO

## 2018-02-24 DIAGNOSIS — G20 Parkinson's disease: Secondary | ICD-10-CM | POA: Diagnosis not present

## 2018-03-04 ENCOUNTER — Ambulatory Visit (INDEPENDENT_AMBULATORY_CARE_PROVIDER_SITE_OTHER): Payer: Medicare HMO | Admitting: Family Medicine

## 2018-03-04 ENCOUNTER — Encounter: Payer: Self-pay | Admitting: Family Medicine

## 2018-03-04 VITALS — BP 92/58 | HR 65 | Temp 98.3°F | Ht 64.0 in | Wt 194.0 lb

## 2018-03-04 DIAGNOSIS — I959 Hypotension, unspecified: Secondary | ICD-10-CM | POA: Diagnosis not present

## 2018-03-04 DIAGNOSIS — I10 Essential (primary) hypertension: Secondary | ICD-10-CM | POA: Diagnosis not present

## 2018-03-04 DIAGNOSIS — G20 Parkinson's disease: Secondary | ICD-10-CM | POA: Diagnosis not present

## 2018-03-04 DIAGNOSIS — F419 Anxiety disorder, unspecified: Secondary | ICD-10-CM | POA: Diagnosis not present

## 2018-03-04 DIAGNOSIS — R5383 Other fatigue: Secondary | ICD-10-CM

## 2018-03-04 LAB — BASIC METABOLIC PANEL
BUN: 21 mg/dL (ref 6–23)
CHLORIDE: 102 meq/L (ref 96–112)
CO2: 28 meq/L (ref 19–32)
Calcium: 9.5 mg/dL (ref 8.4–10.5)
Creatinine, Ser: 1.31 mg/dL — ABNORMAL HIGH (ref 0.40–1.20)
GFR: 42.88 mL/min — ABNORMAL LOW (ref >60.00)
Glucose, Bld: 87 mg/dL (ref 70–99)
POTASSIUM: 4.2 meq/L (ref 3.5–5.1)
SODIUM: 136 meq/L (ref 135–145)

## 2018-03-04 LAB — TSH: TSH: 2.81 u[IU]/mL (ref 0.35–4.50)

## 2018-03-04 LAB — VITAMIN D 25 HYDROXY (VIT D DEFICIENCY, FRACTURES): VITD: 29.89 ng/mL — AB (ref 30.00–100.00)

## 2018-03-04 LAB — B12 AND FOLATE PANEL
Folate: 23.2 ng/mL (ref >5.9)
VITAMIN B 12: 222 pg/mL (ref 211–911)

## 2018-03-04 MED ORDER — HYDROCHLOROTHIAZIDE 12.5 MG PO TABS: 12.5000 mg | ORAL_TABLET | Freq: Every day | ORAL | 3 refills | Status: DC

## 2018-03-04 NOTE — Progress Notes (Signed)
Subjective:    Patient ID: Victoria Hanson, female    DOB: [birth year], 67 y.o.   MRN: [id number]  HPI   Patient presents to clinic due to fatigue, low blood pressure readings.  States she has felt fatigued for the past few weeks.  Also notes over the past few weeks pressure readings have been lower in the low 100s over 60s and 90s over 50s.  She is taking Maxide 25-37.5 for many years for essential hypertension control.  She recently was started on Sinemet for Parkinson's, and was told that this medication could potentially lower her blood pressure.  Patient also concerned about her Xanax prescription.  States she is only given 15 tablets in this prescription, states she usually gets 30 tablets.  Patient Active Problem List   Diagnosis Date Noted  . Breast pain, right 01/25/2018  . Discharge from right nipple 11/10/2017  . Goals of care, counseling/discussion 10/15/2017  . Parkinson's disease ([place]) 07/31/2017  . Chronic obstructive pulmonary disease ([place]) 07/31/2017  . Elevated liver function tests 08/25/2016  . Carotid stenosis 07/19/2016  . Tremor 06/01/2016  . Osteoporosis 04/08/2016  . Foot pain, bilateral 07/24/2015  . Hyperlipidemia 06/21/2015  . Other fatigue 06/21/2015  . BMI 33.0-33.9,adult 06/21/2015  . Abnormal mammogram 06/19/2015  . Urinary system disease 03/28/2015  . Hyperglycemia 03/20/2015  . Swelling of lower extremity 11/20/2014  . Health care maintenance 07/17/2014  . Hypokalemia 04/05/2013  . TIA (transient ischemic attack) 02/09/2013  . Temporary cerebral vascular dysfunction 02/09/2013  . Anxiety, mild 03/13/2012  . Depression, recurrent ([place]) 03/13/2012  . History of colon cancer 03/13/2012  . Hypertension 03/13/2012  . CKD (chronic kidney disease), stage III ([place]) 03/13/2012  . GERD (gastroesophageal reflux disease) 03/13/2012  . Acid reflux 03/13/2012  . History of malignant neoplasm metastatic to lung 03/13/2012  . Major depressive disorder with  single episode 03/13/2012  . Hypercholesterolemia 02/02/2010  . Pure hypercholesterolemia 02/02/2010   Social History   Tobacco Use  . Smoking status: Passive Smoke Exposure - Never Smoker  . Smokeless tobacco: Never Used  . Tobacco comment: husband and sons smoked in her home.  Substance Use Topics  . Alcohol use: Yes    Alcohol/week: 1.0 standard drinks    Types: 1 Glasses of wine per week    Comment: rare wine cooler.   Review of Systems   Constitutional: +fatigue HENT: Negative for congestion, ear pain, sinus pain and sore throat.   Eyes: Negative.   Respiratory: Negative for cough, shortness of breath and wheezing.   Cardiovascular: Negative for chest pain, palpitations and leg swelling. Low BP readings.  Gastrointestinal: Negative for abdominal pain, diarrhea, nausea and vomiting.  Genitourinary: Negative for dysuria, frequency and urgency.  Musculoskeletal: Negative for arthralgias and myalgias.  Skin: Negative for color change, pallor and rash.  Neurological: Negative for syncope, light-headedness and headaches.  Psychiatric/Behavioral: States uses xanax PRN anxiety. Also has Celexa 10mg  per day.        Objective:   Physical Exam  Constitutional: She is oriented to person, place, and time. She appears well-developed and well-nourished. No distress.  Head: Normocephalic and atraumatic.  Eyes: EOM are normal. No scleral icterus.  Neck: Normal range of motion. Neck supple. No tracheal deviation present.  Cardiovascular: Normal rate, regular rhythm and normal heart sounds. No LE edema.  Pulmonary/Chest: Effort normal and breath sounds normal. No respiratory distress.  Neurological: She is alert and oriented to person, place, and time.  Gait normal  Skin:  Skin is warm and dry. No pallor.  Psychiatric: She has a normal mood and affect. Her behavior is normal.   Nursing note and vitals reviewed.   Vitals:   03/04/18 0843  BP: (!) 92/58  Pulse: 65  Temp: 98.3 F  (36.8 C)  SpO2: 92%      Assessment & Plan:    Essential hypertension/low BP readings- patient will stop Dyazide combination medication.  We will have her start just hydrochlorothiazide 12.5 milligrams once daily.  She will continue to monitor her blood pressure in the hospital if they continue to turn well.  Fatigue -- We will get lab work to further evaluate fatigue including basic metabolic panel, thyroid, vitamin D and B12 levels.  It is possible that the fatigue is related to her low blood pressures and also to her Parkinson's disease.  Anxiety- PMP narcotic registry accessed and patient filled a 15 tablet supply of Xanax 0.5 mg to be taken 1/2 tablet twice daily as needed on February 13, 2018.  Going to patient that 15 tablets of 0.5 mg taken 1/2 tablet twice daily equals a 30-day supply.  Patient reassured that if she uses 1/2 tablet of Xanax as needed, she should have plenty of medication to get her through.   Patient will return to clinic in 1 week for recheck of blood pressure and also for flu vaccine; we will currently out of the high-dose flu vaccine in clinic today.

## 2018-03-04 NOTE — Patient Instructions (Signed)
Stop Triamterene-HCTZ medication  Start HCTZ 12.5 mg once daily (this is a water pill, but low dose so it will not drop BP too much)

## 2018-03-07 ENCOUNTER — Ambulatory Visit
Admission: RE | Admit: 2018-03-07 | Discharge: 2018-03-07 | Disposition: A | Discharge status: Home / Self Care | Payer: Medicare HMO | Source: Ambulatory Visit | Attending: Urgent Care | Admitting: Urgent Care

## 2018-03-07 DIAGNOSIS — Z85118 Personal history of other malignant neoplasm of bronchus and lung: Secondary | ICD-10-CM | POA: Diagnosis not present

## 2018-03-07 DIAGNOSIS — I700 Atherosclerosis of aorta: Secondary | ICD-10-CM | POA: Diagnosis not present

## 2018-03-07 DIAGNOSIS — Z9049 Acquired absence of other specified parts of digestive tract: Secondary | ICD-10-CM | POA: Insufficient documentation

## 2018-03-07 DIAGNOSIS — C189 Malignant neoplasm of colon, unspecified: Secondary | ICD-10-CM | POA: Diagnosis not present

## 2018-03-07 DIAGNOSIS — Z8589 Personal history of malignant neoplasm of other organs and systems: Secondary | ICD-10-CM

## 2018-03-07 DIAGNOSIS — Z90710 Acquired absence of both cervix and uterus: Secondary | ICD-10-CM | POA: Diagnosis not present

## 2018-03-07 DIAGNOSIS — C7800 Secondary malignant neoplasm of unspecified lung: Secondary | ICD-10-CM | POA: Diagnosis not present

## 2018-03-07 DIAGNOSIS — C3490 Malignant neoplasm of unspecified part of unspecified bronchus or lung: Secondary | ICD-10-CM | POA: Diagnosis not present

## 2018-03-09 NOTE — Progress Notes (Signed)
[facility] day:  03/10/2018  Chief Complaint: Victoria Hanson is a 67 y.o. female with metastatic colon cancer who is seen for 4 month assessment and review of interval imaging.   HPI:  The patient was last seen in the medical oncology clinic on 10/15/2017.  At that time, patient was doing well. She complained of having a "pin hole" in her RIGHT nipple with sanguinous discharge. She also complained of difficulty emptying her bladder; no dysuria or urgency. Eating well; weight up 3 pounds. Exam revealed a small eschar to her RIGHT nipple; no discharge observed.   She was seen in follow up consult on 10/17/2017 by Dr. Tama High (surgery). Notes reviewed. Patient seen for further evaluation of the "pinhole" in her RIGHT nipple. Finding felt to be related to dry cracked skin. Coconut oil or lotion recommended. Discussed early mammogram and ultrasound. Patient to follow up with surgery for further drainage or concerns.   Patient was seen in the ED on 11/07/2017 by Dr. Merlyn Lot. Notes reviewed. Patient presented for bloating in the setting of a known UTI. She had been on Bactrim, however self discontinued as she felt it contributory to her abdominal bloating and nausea. Given her PMH (+) for cancer, she underwent CT imaging of her abdomen that was negative. UA had improved. Patient discharged home with instructions to follow up with PCP.   She was seen in follow up consult on 12/03/2017 by Dr. Merton Border (pulmonary medicine). Notes reviewed. Recent PFTs stable. ICS/LABA (Advair) and SABA (Albuterol) inhalers continued. Patient to follow up in 6 months barring any acute exacerbations in her respiratory status.   She was seen in follow up consult on 01/20/2018 by Dr. Einar Pheasant (primary care). Notes reviewed. Patient being treated for depression. Recent change from bupropion to citalopram has improved symptoms. Patient on alprazolam for anxiety.  She complained of pain in 9 o'clock position of her RIGHT breast. She was scheduled for a diagnostic mammogram.  BILATERAL diagnostic mammogram done on 02/06/2018 revealed no mammographic evidence of malignancy in either breast.  Patient was seen by Philis Nettle, NP (primary care) on 03/04/2018. Noted reviewed. Patient with low blood pressures. SBPs running in the 90s. Dyazide was discontinued and replaced with HCTZ 12.5 mg. Vitamin D low; started on supplements. Patient to follow up in 1 week.  Chest, abdomen, and pelvis CT on 03/07/2018 revealed no concerning findings consistent with recurrent disease.   In the interim, patient doing well. She notes that the previously related breast concerns have resolved. She is no longer having drainage from her RIGHT breast. She has been having issues with her blood pressure. Medications recently adjusted. Patient complains of dyspnea "when her blood pressure" gets low. Patient notes that when her breathing "gets bad", she takes her alprazolam and it goes away. Patient concerned about quality of alprazolam dispensed (#15). Patient states, "I wont have enough now. I take it as needed, but sometimes I need it more than 0.5 mg (1/2 tab) once a day".   Patient denies that she has experienced any B symptoms. She denies any interval infections. Patient denies bleeding; no hematochezia, melena, or gross hematuria. Patient advises that she maintains an adequate appetite. She is eating well. Weight today is 193 lb 1 oz (87.6 kg), which compared to her last visit to the clinic, represents a 2 pound decrease.  Patient denies pain in the clinic today.   Past Medical History:  Diagnosis Date  .  Anxiety   . Asthma   . Atypical chest pain    a. 2003 Cath: reportedly nl per pt [name]);  b. 04/2015 [facility] Admission, neg troponin.  . Colon cancer ([place]) 1999   a. 1999: Pt had partial colectomy. Did develop lung metastasis. Had right upper lobectomy in 01/2007 then had  associated chemotherapy.  Marland Kitchen COPD (chronic obstructive pulmonary disease) ([place])   . DDD (degenerative disc disease), lumbosacral   . Depression   . Echocardiogram abnormal    a. 02/2010 Echo: EF 55-60%, mild LAE, no regional wall motion abnormalities  . Elevated transaminase level    a. ? NASH;  b. 06/2015 - nl LFTs.  . GERD (gastroesophageal reflux disease)   . Hematuria   . Holter monitor, abnormal    a. 12/2009 Holter monitoring: NSR, rare PACs and PVCs.  Marland Kitchen Hypercholesterolemia   . Hyperlipidemia   . Hypertension   . Leaky heart valve    a. Per pt report - no evidence of valvular abnormalities on prior echoes.  . Lesion of skin of Right breast 10/20/2017  . Lung metastases ([place]) 2008   a. S/P resection (lobectomy - right)  . Morbid obesity ([place])   . Nephrolithiasis   . Parkinson disease ([place]) 08/2016   [name]    Past Surgical History:  Procedure Laterality Date  . BREAST EXCISIONAL BIOPSY Right 10/07/2009   benign  . CARDIAC CATHETERIZATION  2003   negative as per pt report  . CHOLECYSTECTOMY  2000  . COLON SURGERY     Colon cancer, removed part of large colon  . COLONOSCOPY N/A 04/25/2015   Procedure: COLONOSCOPY;  Surgeon: Manya Silvas, MD;  Location: [facility] ENDOSCOPY;  Service: Endoscopy;  Laterality: N/A;  . COLONOSCOPY WITH PROPOFOL N/A 06/14/2017   Procedure: COLONOSCOPY WITH PROPOFOL;  Surgeon: Manya Silvas, MD;  Location: [facility] ENDOSCOPY;  Service: Endoscopy;  Laterality: N/A;  . LOBECTOMY  01/2007   Right lung, upper lobe right side removed  . TOTAL ABDOMINAL HYSTERECTOMY     abnormal bleeding  . TUBAL LIGATION  1980    Family History  Problem Relation Age of Onset  . Coronary artery disease Mother        died @ 73 of MI.  . Mental illness Mother   . Diabetes Mother   . Heart attack Father 32       MI  . Cancer Father        lung - died in his mid-70's  . Hypertension Father   . Diabetes Father   . Sudden death Brother   . Mental illness Brother    . Heart attack Brother        MI @ 26, died @ age 56.  . Breast cancer Neg Hx     Social History:  reports that she is a non-smoker but has been exposed to tobacco smoke. She has never used smokeless tobacco. She reports that she drinks about 1.0 standard drinks of alcohol per week. She reports that she does not use drugs.  She lives in [place] with her daughter [name]. The patient is accompanied by her daughter today.  Allergies:  Allergies  Allergen Reactions  . Macrobid [Nitrofurantoin Macrocrystal] Shortness Of Breath and Palpitations  . Antihistamines, Diphenhydramine-Type Other (See Comments)    She does not know which antihistamine she has an allergic reaction to.  . Aspirin Other (See Comments)    Makes her sick on the stomach if she takes too many and causes bleeding.  Of note, she can take ibuprofen.  . Ceftin [Cefuroxime Axetil] Other (See Comments)    Chest pains  . Celecoxib Other (See Comments)    Reaction: Blood Disorder GI bleeding.  Marland Kitchen Cymbalta [Duloxetine Hcl] Other (See Comments)    Reaction: unknown  . Diphenhydramine Other (See Comments)  . Escitalopram Other (See Comments)  . Lexapro [Escitalopram Oxalate] Other (See Comments)    Reaction: Difficulty with speech  . Metronidazole Other (See Comments)    Other reaction(s): Unknown  . Nitrofurantoin Other (See Comments)  . Zoloft [Sertraline Hcl]   . Ciprofloxacin Other (See Comments)    Reaction: unknown    Current Medications: Current Outpatient Medications  Medication Sig Dispense Refill  . ALPRAZolam (XANAX) 0.5 MG tablet TAKE 1/2 TABLET BY MOUTH DAILY AS NEEDED FOR ANXIETY 30 tablet 0  . aspirin EC 81 MG tablet Take 81 mg by mouth daily.    . carbidopa-levodopa (SINEMET IR) 25-100 MG tablet Take 2 tablets by mouth 3 (three) times daily.     . citalopram (CELEXA) 10 MG tablet TAKE 1 TABLET BY MOUTH DAILY. 30 tablet 2  . fluticasone-salmeterol (ADVAIR HFA) 45-21 MCG/ACT inhaler Inhale 2 puffs into the lungs  2 (two) times daily. 1 Inhaler 12  . hydrochlorothiazide (HYDRODIURIL) 12.5 MG tablet Take 1 tablet (12.5 mg total) by mouth daily. 30 tablet 3  . Multiple Vitamin (MULTIVITAMIN) tablet Take 1 tablet by mouth daily.    Marland Kitchen omeprazole (PRILOSEC) 20 MG capsule TAKE 1 CAPSULE BY MOUTH EVERY DAY 30 capsule 3  . polyethylene glycol powder (GLYCOLAX/MIRALAX) powder     . potassium chloride SA (K-DUR,KLOR-CON) 20 MEQ tablet TAKE ONE TABLET BY MOUTH TWICE A DAY 60 tablet 3  . traZODone (DESYREL) 50 MG tablet TAKE 2 TABLETS BY MOUTH AT BEDTIME 60 tablet 1  . VENTOLIN HFA 108 (90 Base) MCG/ACT inhaler INHALE 2 PUFFS BY MOUTH INTO THE LUNGS EVERY 6 HOURS AS NEEDED FOR WHEEZING 18 g 1   No current facility-administered medications for this visit.     Review of Systems  Constitutional: Positive for weight loss (2 pound from 10/15/2017). Negative for diaphoresis, fever and malaise/fatigue.  HENT: Negative.   Eyes: Negative.   Respiratory: Positive for shortness of breath (exertional). Negative for cough, hemoptysis and sputum production.        COPD and asthma  Cardiovascular: Negative for chest pain, palpitations, orthopnea, leg swelling and PND.       Labile blood pressure; recent changes in antihypertensive medications.   Gastrointestinal: Negative for abdominal pain, blood in stool, constipation, diarrhea, melena, nausea and vomiting.  Genitourinary: Negative for dysuria, frequency, hematuria and urgency.  Musculoskeletal: Negative for back pain, falls, joint pain and myalgias.  Skin: Negative for itching and rash.  Neurological: Negative for dizziness, tremors, weakness and headaches.       PMH (+) for Parkinsons  Endo/Heme/Allergies: Does not bruise/bleed easily.  Psychiatric/Behavioral: Positive for depression (on cipalopram). Negative for memory loss and suicidal ideas. The patient is nervous/anxious (on alprazolam). The patient does not have insomnia.   All other systems reviewed and are  negative.  Performance status (ECOG): 1 - Symptomatic but completely ambulatory  Vital Signs: BP (!) 159/75 (BP Location: Left Arm, Patient Position: Sitting)   Pulse 69   Temp 97.8 F (36.6 C) (Tympanic)   Resp 18   Wt 193 lb 1 oz (87.6 kg)   BMI 33.14 kg/m   Physical Exam  Constitutional: She is oriented to person, place, and time  and well-developed, well-nourished, and in no distress.  HENT:  Head: Normocephalic and atraumatic.  Short dark gray hair  Eyes: Pupils are equal, round, and reactive to light. EOM are normal. No scleral icterus.  Glasses. Dark hazel eyes.   Neck: Normal range of motion. Neck supple. No tracheal deviation present. No thyromegaly present.  Cardiovascular: Normal rate, regular rhythm and normal heart sounds. Exam reveals no gallop and no friction rub.  No murmur heard. Pulmonary/Chest: Effort normal and breath sounds normal. No respiratory distress. She has no wheezes. She has no rales.  Abdominal: Soft. Bowel sounds are normal. She exhibits no distension. There is no tenderness.  Musculoskeletal: Normal range of motion. She exhibits no edema or tenderness.  Chronic lower extremity changes.  Lymphadenopathy:    She has no cervical adenopathy.    She has no axillary adenopathy.       Right: No inguinal and no supraclavicular adenopathy present.       Left: No inguinal and no supraclavicular adenopathy present.  Neurological: She is alert and oriented to person, place, and time.  Skin: Skin is warm and dry. No rash noted. No erythema.  Psychiatric: Mood, affect and judgment normal.  Nursing note and vitals reviewed.    Appointment on 03/10/2018  Component Date Value Ref Range Status  . Sodium 03/10/2018 138  135 - 145 mmol/L Final  . Potassium 03/10/2018 3.9  3.5 - 5.1 mmol/L Final  . Chloride 03/10/2018 103  98 - 111 mmol/L Final  . CO2 03/10/2018 27  22 - 32 mmol/L Final  . Glucose, Bld 03/10/2018 110* 70 - 99 mg/dL Final  . BUN 03/10/2018 16   8 - 23 mg/dL Final  . Creatinine, Ser 03/10/2018 1.27* 0.44 - 1.00 mg/dL Final  . Calcium 03/10/2018 9.2  8.9 - 10.3 mg/dL Final  . Total Protein 03/10/2018 7.1  6.5 - 8.1 g/dL Final  . Albumin 03/10/2018 3.8  3.5 - 5.0 g/dL Final  . AST 03/10/2018 20  15 - 41 U/L Final  . ALT 03/10/2018 <5  0 - 44 U/L Final  . Alkaline Phosphatase 03/10/2018 78  38 - 126 U/L Final  . Total Bilirubin 03/10/2018 0.6  0.3 - 1.2 mg/dL Final  . GFR calc non Af Amer 03/10/2018 42* >60 mL/min Final  . GFR calc Af Amer 03/10/2018 49* >60 mL/min Final   Comment: (NOTE) The eGFR has been calculated using the CKD EPI equation. This calculation has not been validated in all clinical situations. eGFR's persistently <60 mL/min signify possible Chronic Kidney Disease.   Georgiann Hahn gap 03/10/2018 8  5 - 15 Final   Performed at [facility], [address] [place], [place] [address]  . WBC 03/10/2018 8.3  3.6 - 11.0 K/uL Final  . RBC 03/10/2018 4.25  3.80 - 5.20 MIL/uL Final  . Hemoglobin 03/10/2018 12.3  12.0 - 16.0 g/dL Final  . HCT 03/10/2018 36.5  35.0 - 47.0 % Final  . MCV 03/10/2018 85.9  80.0 - 100.0 fL Final  . MCH 03/10/2018 28.9  26.0 - 34.0 pg Final  . MCHC 03/10/2018 33.6  32.0 - 36.0 g/dL Final  . RDW 03/10/2018 14.0  11.5 - 14.5 % Final  . Platelets 03/10/2018 248  150 - 440 K/uL Final  . Neutrophils Relative % 03/10/2018 73  % Final  . Neutro Abs 03/10/2018 6.1  1.4 - 6.5 K/uL Final  . Lymphocytes Relative 03/10/2018 19  % Final  . Lymphs Abs 03/10/2018 1.6  1.0 - 3.6 K/uL Final  . Monocytes Relative 03/10/2018 6  % Final  . Monocytes Absolute 03/10/2018 0.5  0.2 - 0.9 K/uL Final  . Eosinophils Relative 03/10/2018 1  % Final  . Eosinophils Absolute 03/10/2018 0.1  0 - 0.7 K/uL Final  . Basophils Relative 03/10/2018 1  % Final  . Basophils Absolute 03/10/2018 0.1  0 - 0.1 K/uL Final   Performed at [facility], [address] [place], [place] [address]    Assessment:  Victoria Hanson is a 67 y.o. female with metastatic colon cancer s/p partial colectomy in 10/1998.   Pathologic stage was T2N0M0 (no details are available).  She did not receive chemotherapy.  Imaging studies revealed a right lung lesion.  She underwent right upper lobe lobectomy in 01/17/2007.  Pathology confirmed metastatic colon cancer.  She received 6 months of FOLFOX chemotherapy (completed 08/2007).  Course was truncated secondary to neuropathy.  Patient notes recurrence was not associated with an elevated CEA.  Abdomen and pelvic CT on 02/16/2015 revealed no evidence of metastatic disease.  Chest, abdomen, and pelvic CT on 03/01/2016 revealed no evidence of recurrent or metastatic carcinoma.  Chest CT on 03/06/2017 revealed postsurgical changes on the right from a right upper lobectomy.  There was no evidence of malignancy. Chest, abdomen, and pelvic CT on 03/07/2018 revealed no concerning findings consistent with recurrent disease.   Colonoscopy on 04/25/2015 revealed only internal hemorrhoids.  Colonoscopy on 06/14/2017 revealed 2 diminutive polyp in the transverse colon (hyperplastic polyps) and internal hemorrhoids.  CXR on 11/07/2015 revealed minimal bibasilar atelectasis.  Head MRI on 09/01/2015 was normal.  BILATERAL diagnostic mammogram on 02/06/2018 revealed no mammographic evidence of malignancy in either breast.  CEA has been followed: 0.8 on 12/26/2011, 0.6 on 10/29/2012, 0.9 on 10/26/2013, 0.8 on 05/17/2015, 1.1 on 08/23/2016, 0.9 on 02/28/2017, 1.1 on 10/15/2017, and 0.9 on 03/10/2018.   Symptomatically, she is doing well overall. She is having issues with her blood pressure. Recent changes to her medications have been made. Patient denies bleeding; no hematochezia, melena, or gross hematuria. Breathing stable on prescribed MDIs. Breast concerns have resolved. Exam is grossly unremarkable.   Plan: 1. Labs today: CBC with diff, CMP, CEA.  2. Review interval ED visit.  3. Metastatic colon  cancer  Doing well. No concerns or symptoms. CEA 0.9.  Review CT of chest, abdomen, and pelvis. Imaging personally reviewed and felt to be consistent with the dictated radiology report.   Colonoscopy UTD; next due 06/2022. 4. Breast concerns  Review consult with Dr. Rosana Hoes (surgery) - "pinhole" felt to be related to dry cracked skin. Follow up PRN.  Review recent mammogram - no evidence of malignancy.  5. B12 deficiency  B12 level recently found to be low at 222.   Will start oral B12 1,000 mcg daily. Patient to obtain OTC supplement.   Discuss need to check B12 in 1 month. If note improving, anticipate parenteral supplementation.   Patient requesting for PCP to check B12 level due to associated co-pay in the cancer center.  6. Hypertension  Blood pressure 159/75 today.  Recent change in therapy from Dyazide to HCTZ 12.5 mg daily.  Continue routine follow up with PCP for ongoing assessment and medication titrations.  7. COPD  Recent visit with pulmonary medicine. PFTs stable.   Continue ICS/LABA and SABA as prescribed.  Follow up with Dr. Alva Garnet as already scheduled.  8. Depression and anxiety  Symptoms improved with change from bupropion to citalopram.  Continues on alprazolam 0.25 mg daily. Concerned about quantity (#15) dispensed. Encouraged to follow up with PCP to discuss.   PCP managing medications. Continue routine follow up.  9. RTC in 6  months for MD assessment and labs (CBC with diff, CMP, CEA)   Honor Loh, NP  03/10/2018, 12:14 PM   I saw and evaluated the patient, participating in the key portions of the service and reviewing pertinent diagnostic studies and records.  I reviewed the nurse practitioner's note and agree with the findings and the plan.  The assessment and plan were discussed with the patient.  A few questions were asked by the patient and answered.   Lequita Asal, MD  03/10/2018, 12:14 PM

## 2018-03-10 ENCOUNTER — Encounter: Payer: Self-pay | Admitting: Hematology and Oncology

## 2018-03-10 ENCOUNTER — Inpatient Hospital Stay: Payer: Medicare HMO | Attending: Hematology and Oncology

## 2018-03-10 ENCOUNTER — Inpatient Hospital Stay (HOSPITAL_BASED_OUTPATIENT_CLINIC_OR_DEPARTMENT_OTHER): Payer: Medicare HMO | Admitting: Hematology and Oncology

## 2018-03-10 VITALS — BP 159/75 | HR 69 | Temp 97.8°F | Resp 18 | Wt 193.1 lb

## 2018-03-10 DIAGNOSIS — E538 Deficiency of other specified B group vitamins: Secondary | ICD-10-CM

## 2018-03-10 DIAGNOSIS — F329 Major depressive disorder, single episode, unspecified: Secondary | ICD-10-CM | POA: Diagnosis not present

## 2018-03-10 DIAGNOSIS — G20 Parkinson's disease: Secondary | ICD-10-CM | POA: Insufficient documentation

## 2018-03-10 DIAGNOSIS — F419 Anxiety disorder, unspecified: Secondary | ICD-10-CM | POA: Insufficient documentation

## 2018-03-10 DIAGNOSIS — C7800 Secondary malignant neoplasm of unspecified lung: Secondary | ICD-10-CM | POA: Insufficient documentation

## 2018-03-10 DIAGNOSIS — Z85118 Personal history of other malignant neoplasm of bronchus and lung: Secondary | ICD-10-CM | POA: Diagnosis not present

## 2018-03-10 DIAGNOSIS — Z85038 Personal history of other malignant neoplasm of large intestine: Secondary | ICD-10-CM | POA: Diagnosis not present

## 2018-03-10 DIAGNOSIS — I10 Essential (primary) hypertension: Secondary | ICD-10-CM | POA: Diagnosis not present

## 2018-03-10 DIAGNOSIS — J449 Chronic obstructive pulmonary disease, unspecified: Secondary | ICD-10-CM | POA: Insufficient documentation

## 2018-03-10 DIAGNOSIS — Z7982 Long term (current) use of aspirin: Secondary | ICD-10-CM | POA: Diagnosis not present

## 2018-03-10 DIAGNOSIS — C189 Malignant neoplasm of colon, unspecified: Secondary | ICD-10-CM

## 2018-03-10 DIAGNOSIS — Z8589 Personal history of malignant neoplasm of other organs and systems: Secondary | ICD-10-CM

## 2018-03-10 LAB — CBC WITH DIFFERENTIAL/PLATELET
Basophils Absolute: 0.1 10*3/uL (ref 0–0.1)
Basophils Relative: 1 %
Eosinophils Absolute: 0.1 10*3/uL (ref 0–0.7)
Eosinophils Relative: 1 %
HCT: 36.5 % (ref 35.0–47.0)
Hemoglobin: 12.3 g/dL (ref 12.0–16.0)
Lymphocytes Relative: 19 %
Lymphs Abs: 1.6 10*3/uL (ref 1.0–3.6)
MCH: 28.9 pg (ref 26.0–34.0)
MCHC: 33.6 g/dL (ref 32.0–36.0)
MCV: 85.9 fL (ref 80.0–100.0)
Monocytes Absolute: 0.5 10*3/uL (ref 0.2–0.9)
Monocytes Relative: 6 %
Neutro Abs: 6.1 10*3/uL (ref 1.4–6.5)
Neutrophils Relative %: 73 %
Platelets: 248 10*3/uL (ref 150–440)
RBC: 4.25 MIL/uL (ref 3.80–5.20)
RDW: 14 % (ref 11.5–14.5)
WBC: 8.3 10*3/uL (ref 3.6–11.0)

## 2018-03-10 LAB — COMPREHENSIVE METABOLIC PANEL
ALT: <5 U/L (ref 0–44)
AST: 20 U/L (ref 15–41)
Albumin: 3.8 g/dL (ref 3.5–5.0)
Alkaline Phosphatase: 78 U/L (ref 38–126)
Anion gap: 8 (ref 5–15)
BUN: 16 mg/dL (ref 8–23)
CO2: 27 mmol/L (ref 22–32)
Calcium: 9.2 mg/dL (ref 8.9–10.3)
Chloride: 103 mmol/L (ref 98–111)
Creatinine, Ser: 1.27 mg/dL — ABNORMAL HIGH (ref 0.44–1.00)
GFR calc Af Amer: 49 mL/min — ABNORMAL LOW (ref >60)
GFR calc non Af Amer: 42 mL/min — ABNORMAL LOW (ref >60)
Glucose, Bld: 110 mg/dL — ABNORMAL HIGH (ref 70–99)
Potassium: 3.9 mmol/L (ref 3.5–5.1)
Sodium: 138 mmol/L (ref 135–145)
Total Bilirubin: 0.6 mg/dL (ref 0.3–1.2)
Total Protein: 7.1 g/dL (ref 6.5–8.1)

## 2018-03-10 NOTE — Progress Notes (Signed)
Patient here today for CT results.  Offers no complaints today.

## 2018-03-10 NOTE — Patient Instructions (Signed)
Need to get B12 1,000 mcg tablets. Please take 1 time a day.     Respectfully, Honor Loh, MSN, APRN, FNP-C, CEN Oncology/Hematology Nurse Practitioner  [facility]  Vitamin B12 Deficiency Vitamin B12 deficiency means that your body is not getting enough vitamin B12. Your body needs vitamin B12 for important bodily functions. If you do not have enough vitamin B12 in your body, you can have health problems. Follow these instructions at home:  Take supplements only as told by your doctor. Follow the directions carefully.  Get any shots (injections) as told by your doctor. Do not miss your visits to the doctor.  Eat lots of healthy foods that contain vitamin B12. Ask your doctor if you should work with someone who is trained in how food affects health (dietitian). Foods that contain vitamin B12 include: ? Meat. ? Meat from birds (poultry). ? Fish. ? Eggs. ? Cereal and dairy products that are fortified. This means that vitamin B12 has been added to the food. Check the label on the package to see if the food is fortified.  Do not drink too much (do not abuse) alcohol.  Keep all follow-up visits as told by your doctor. This is important. Contact a doctor if:  Your symptoms come back. Get help right away if:  You have trouble breathing.  You have chest pain.  You get dizzy.  You pass out (lose consciousness). This information is not intended to replace advice given to you by your health care provider. Make sure you discuss any questions you have with your health care provider. Document Released: 05/17/2011 Document Revised: 11/03/2015 Document Reviewed: 10/13/2014 Elsevier Interactive Patient Education  [employer].

## 2018-03-11 DIAGNOSIS — E538 Deficiency of other specified B group vitamins: Secondary | ICD-10-CM | POA: Insufficient documentation

## 2018-03-11 LAB — CEA: CEA: 0.9 ng/mL (ref 0.0–4.7)

## 2018-03-28 ENCOUNTER — Other Ambulatory Visit: Payer: Self-pay | Admitting: Internal Medicine

## 2018-03-28 ENCOUNTER — Telehealth: Payer: Self-pay | Admitting: *Deleted

## 2018-03-31 NOTE — Telephone Encounter (Signed)
Last OV 01/20/2018  Next OV 05/02/2018  Last refill 10/29/2017   Patient is out of medication

## 2018-03-31 NOTE — Telephone Encounter (Signed)
Copied from [place] [id number]. Topic: General - Other >> Mar 31, 2018  9:18 AM Victoria Hanson wrote: Reason for CRM: Patient is calling to state she is completely out of medication, advise her 3 business days. A refill request was sent on 03-28-18.  She stated she is needing this as soon as possible. Please advise

## 2018-04-02 IMAGING — CT CT HEAD W/O CM
3 series · 15 of 47 positions shown, 18 images · non-contrast
Comparison: Head CT August 01, 2015 and brain MRI October 20, 2015

CLINICAL DATA: Headache with tremors and left arm numbness. History
of colon carcinoma

EXAM:
CT HEAD WITHOUT CONTRAST
TECHNIQUE: Contiguous axial images were obtained from the base of the skull
through the vertex without intravenous contrast.

[Series 2: head wo · axial · 0.42mm/px · z∈[-161,-36]mm · 9 of 31 slices shown, 12 images]
[im 3/31  brain]
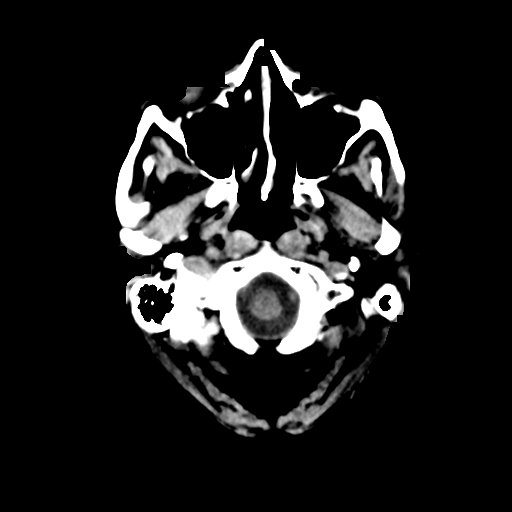
[im 3/31  bone]
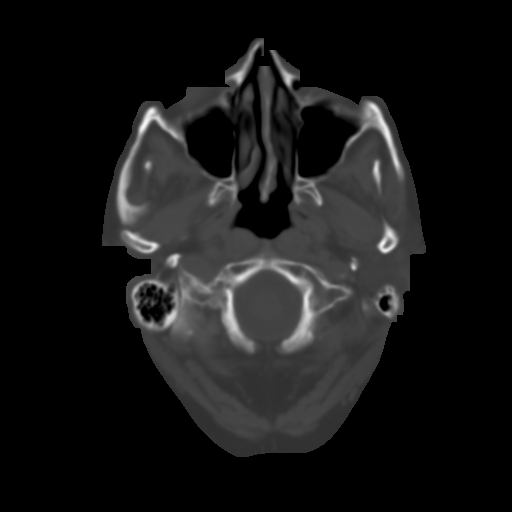
[im 6/31  brain]
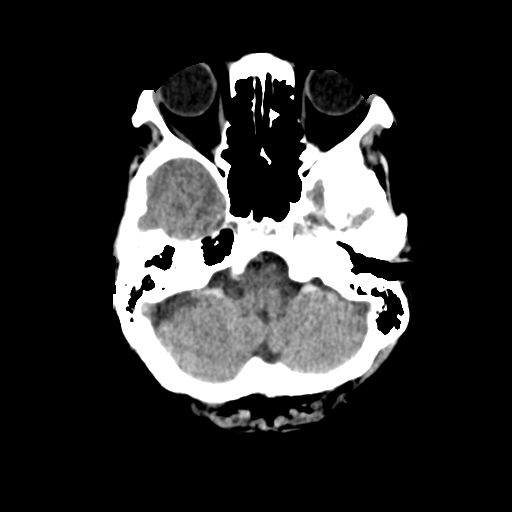
[im 9/31  brain]
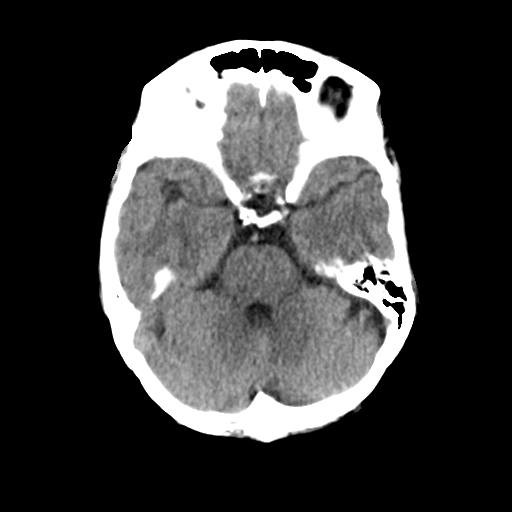
[im 12/31  brain]
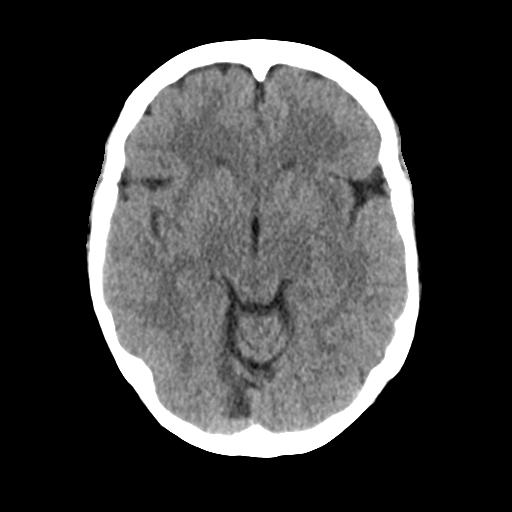
[im 16/31  brain]
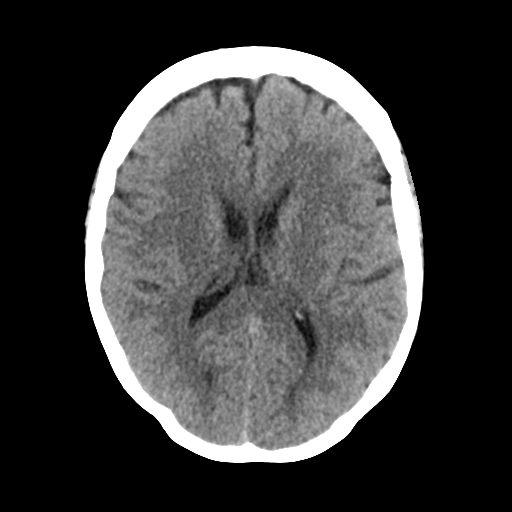
[im 16/31  bone]
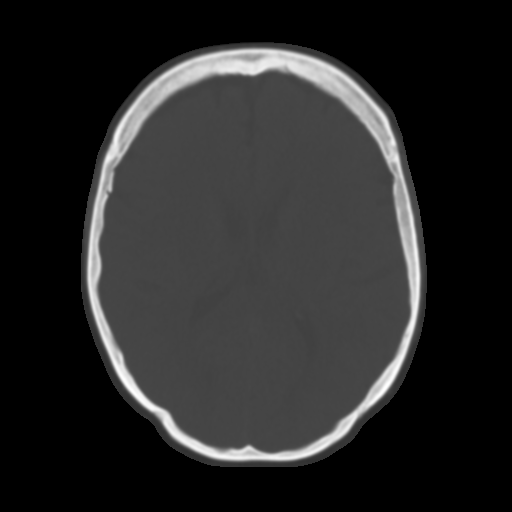
[im 19/31  brain]
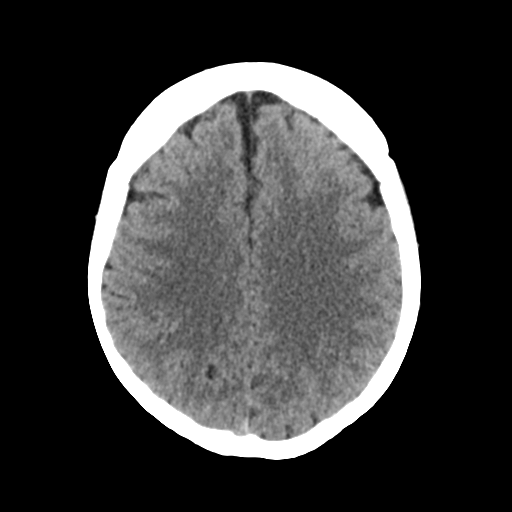
[im 22/31  brain]
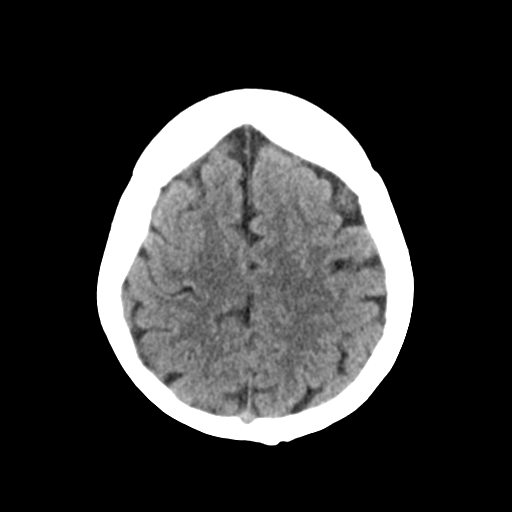
[im 25/31  brain]
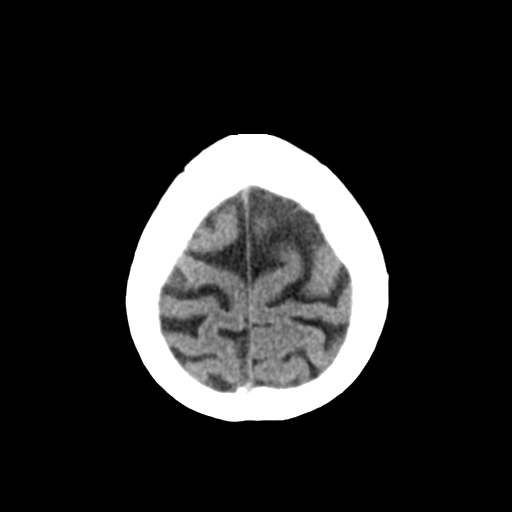
[im 28/31  brain]
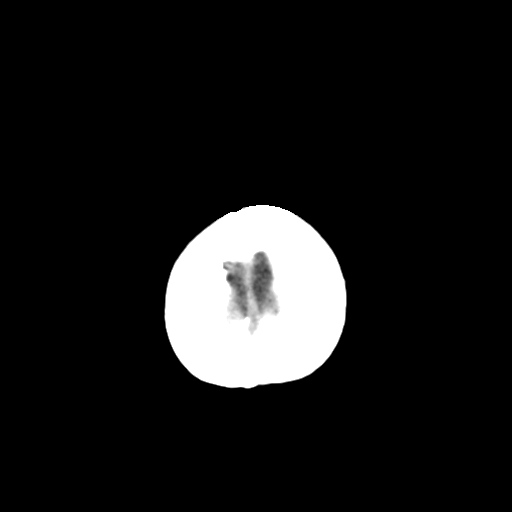
[im 28/31  bone]
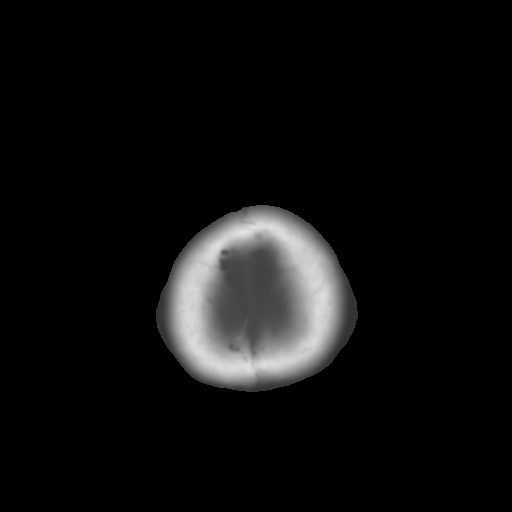

[Series 4: coronal soft tissue · coronal · 0.32mm/px · 3 of 67 slices shown]
[im 23/67  brain]
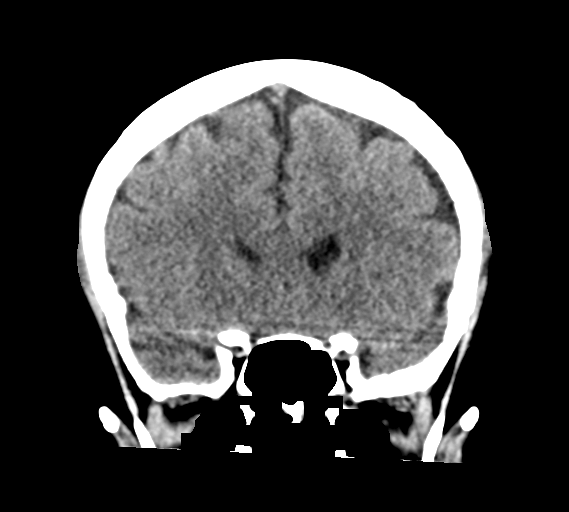
[im 30/67  brain]
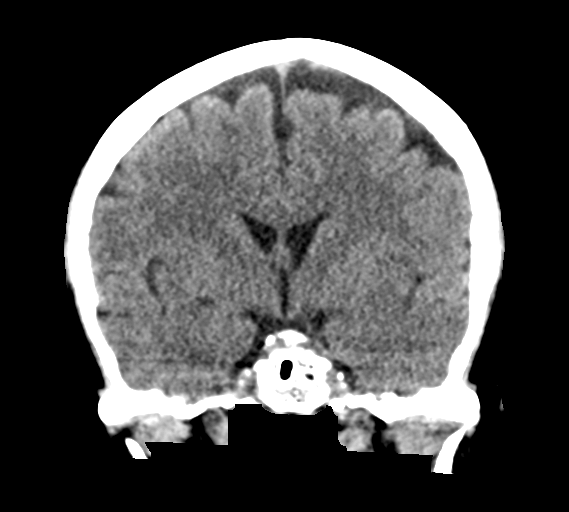
[im 37/67  brain]
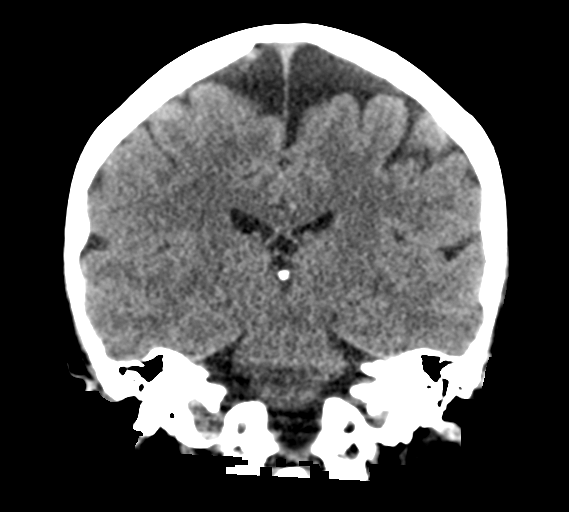

[Series 5: sagittal soft tissue · sagittal · 0.33mm/px · 3 of 52 slices shown]
[im 18/52  brain]
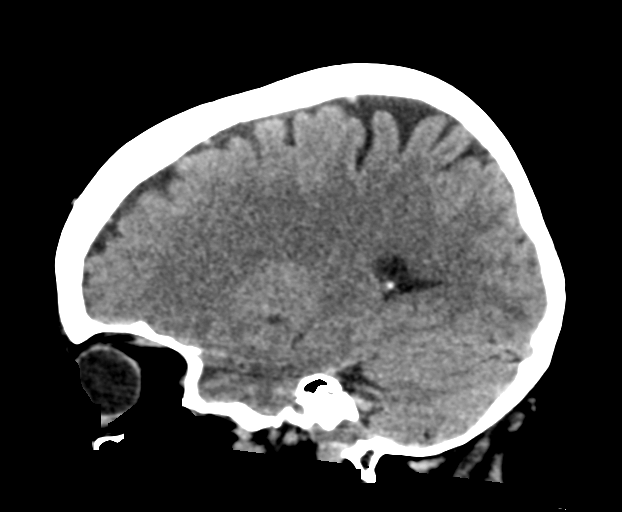
[im 26/52  brain]
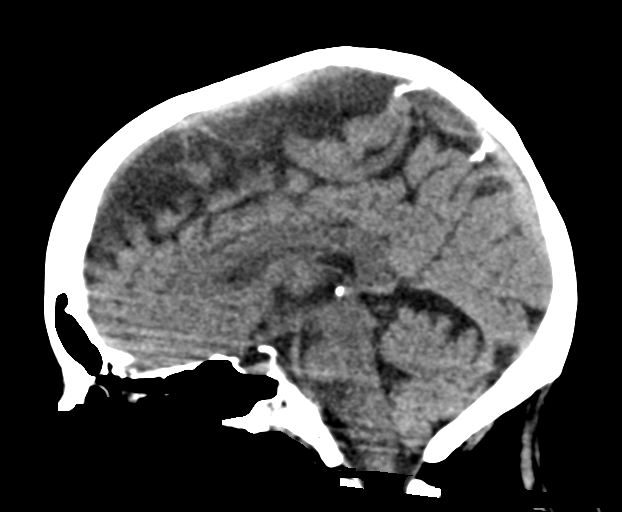
[im 35/52  brain]
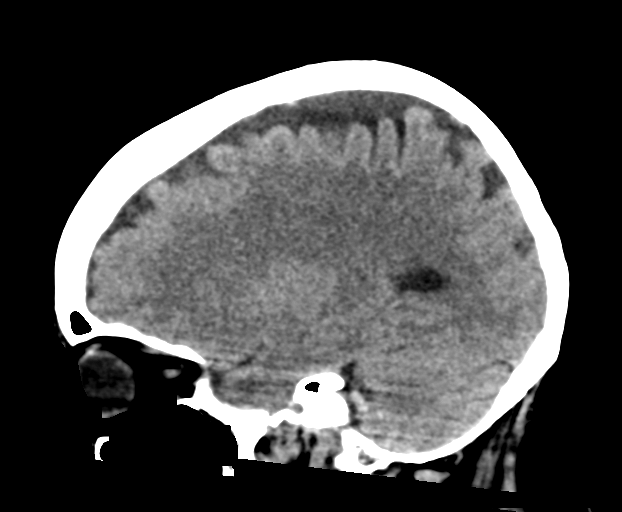

[15 of 47 positions shown; findings below may reference images not displayed]

FINDINGS: Brain: The ventricles are normal in size and configuration. There is
focal atrophy near the frontal-parietal junction superiorly. There
is no intracranial mass, hemorrhage, extra-axial fluid collection,
or midline shift. Gray-white compartments appear normal. No acute
infarct evident.

Vascular: No hyperdense vessels.  No vascular calcification evident.

Skull: Bony calvarium appears intact.

Sinuses/Orbits: There is mild mucosal thickening in several ethmoid
air cells bilaterally. Other visualized paranasal sinuses are clear.
Orbits appear symmetric bilaterally.

Other: Mastoid air cells are clear.
IMPRESSION: Stable focal atrophy superiorly at the frontal-parietal junction.
Ventricles normal in size and configuration. No intracranial mass,
hemorrhage, extra-axial fluid collection, or midline shift.
Gray-white compartments appear normal. Mild mucosal thickening in
several ethmoid air cells.

## 2018-04-04 ENCOUNTER — Emergency Department
Admission: EM | Admit: 2018-04-04 | Discharge: 2018-04-04 | Disposition: A | Discharge status: Home / Self Care | Payer: Medicare HMO | Attending: Emergency Medicine | Admitting: Emergency Medicine

## 2018-04-04 ENCOUNTER — Ambulatory Visit: Payer: Self-pay

## 2018-04-04 ENCOUNTER — Emergency Department: Payer: Medicare HMO

## 2018-04-04 ENCOUNTER — Other Ambulatory Visit: Payer: Self-pay

## 2018-04-04 DIAGNOSIS — Z7982 Long term (current) use of aspirin: Secondary | ICD-10-CM | POA: Diagnosis not present

## 2018-04-04 DIAGNOSIS — J449 Chronic obstructive pulmonary disease, unspecified: Secondary | ICD-10-CM | POA: Diagnosis not present

## 2018-04-04 DIAGNOSIS — Z85038 Personal history of other malignant neoplasm of large intestine: Secondary | ICD-10-CM | POA: Insufficient documentation

## 2018-04-04 DIAGNOSIS — Z7722 Contact with and (suspected) exposure to environmental tobacco smoke (acute) (chronic): Secondary | ICD-10-CM | POA: Diagnosis not present

## 2018-04-04 DIAGNOSIS — I10 Essential (primary) hypertension: Secondary | ICD-10-CM

## 2018-04-04 DIAGNOSIS — R51 Headache: Secondary | ICD-10-CM | POA: Diagnosis not present

## 2018-04-04 DIAGNOSIS — I129 Hypertensive chronic kidney disease with stage 1 through stage 4 chronic kidney disease, or unspecified chronic kidney disease: Secondary | ICD-10-CM | POA: Insufficient documentation

## 2018-04-04 DIAGNOSIS — G20 Parkinson's disease: Secondary | ICD-10-CM | POA: Insufficient documentation

## 2018-04-04 DIAGNOSIS — N183 Chronic kidney disease, stage 3 (moderate): Secondary | ICD-10-CM | POA: Diagnosis not present

## 2018-04-04 DIAGNOSIS — Z79899 Other long term (current) drug therapy: Secondary | ICD-10-CM | POA: Insufficient documentation

## 2018-04-04 LAB — BASIC METABOLIC PANEL
ANION GAP: 9 (ref 5–15)
BUN: 18 mg/dL (ref 8–23)
CO2: 25 mmol/L (ref 22–32)
Calcium: 9.2 mg/dL (ref 8.9–10.3)
Chloride: 105 mmol/L (ref 98–111)
Creatinine, Ser: 1.46 mg/dL — ABNORMAL HIGH (ref 0.44–1.00)
GFR calc Af Amer: 41 mL/min — ABNORMAL LOW (ref >60)
GFR calc non Af Amer: 36 mL/min — ABNORMAL LOW (ref >60)
GLUCOSE: 122 mg/dL — AB (ref 70–99)
POTASSIUM: 4 mmol/L (ref 3.5–5.1)
SODIUM: 139 mmol/L (ref 135–145)

## 2018-04-04 LAB — CBC
HCT: 35.9 % — ABNORMAL LOW (ref 36.0–46.0)
HEMOGLOBIN: 11.5 g/dL — AB (ref 12.0–15.0)
MCH: 28.2 pg (ref 26.0–34.0)
MCHC: 32 g/dL (ref 30.0–36.0)
MCV: 88 fL (ref 80.0–100.0)
Platelets: 246 10*3/uL (ref 150–400)
RBC: 4.08 MIL/uL (ref 3.87–5.11)
RDW: 13.4 % (ref 11.5–15.5)
WBC: 8.1 10*3/uL (ref 4.0–10.5)
nRBC: 0 % (ref 0.0–0.2)

## 2018-04-04 LAB — TROPONIN I: Troponin I: <0.03 ng/mL (ref <0.03)

## 2018-04-04 MED ORDER — ALPRAZOLAM 0.5 MG PO TABS
0.2500 mg | ORAL_TABLET | Freq: Once | ORAL | Status: AC
  Administered 2018-04-04: 0.25 mg via ORAL
  Filled 2018-04-04: qty 1

## 2018-04-04 NOTE — Discharge Instructions (Signed)
As we discussed, though you do have high blood pressure (hypertension), fortunately it is not immediately dangerous at this time and does not need emergency intervention or admission to the hospital.  If we add to or change your regular medications, we may cause more harm than good - it is more appropriate for your primary care doctor to evaluate you in clinic and decide if any medication changes are needed.  Please follow up in clinic as recommended in these papers.  Return to the Emergency Department (ED) if you experience any chest pain/pressure/tightness, difficulty breathing, or sudden sweating, or other symptoms that concern you.

## 2018-04-04 NOTE — Telephone Encounter (Signed)
Spoke with patient and advised her that she needed to go get BP evaluated ,due to headache and blood pressure being elevate per Dr Nicki Reaper. Patient states she took 1/2 Xanax this afternoon but will go to [facility] to get blood pressure and headache evaluated.

## 2018-04-04 NOTE — ED Provider Notes (Signed)
[facility] Emergency Department Provider Note   ____________________________________________   First MD Initiated Contact with Patient 04/04/18 1936     (approximate)  I have reviewed the triage vital signs and the nursing notes.   HISTORY  Chief Complaint Hypertension    HPI AVYN ADEN is a 67 y.o. female presents for evaluation of  some slight blurring of her vision and a headache over the back of her head earlier today.  She reports for the last couple of days she is noticed intermittently that her vision seems a little blurry, last night she checked her blood pressure and it was high, run 242 systolic.  She reports that she checks her blood pressure regularly, just 2 days ago it was 110 and that because of her Parkinson's disease it goes up and down and sometimes she feels lightheaded when the blood pressure goes low seemingly randomly  No nausea vomiting.  Reports her headache and vision changes are gone now, and she is actually feeling quite a bit better.  She does also reports she felt a little bit of a pulsating sensation of the artery her left neck, but reports that she is felt that before with the high blood pressure but it feels to have resolved now  She had recently had some blood pressure medication changes, she reports she was getting too low on her blood pressure at home previous, and was switched to low-dose of hydrochlorothiazide.  No chest pain.  No trouble breathing.  No fevers or chills.  Called her primary care doctor today but they can see her, referred her here.  She also reports she wonders if sometimes anxiety drives her blood pressure be high, yesterday when her blood pressure was high she took a small amount of her Xanax and reports it got much better and her blood pressure went back down.  Past Medical History:  Diagnosis Date  . Anxiety   . Asthma   . Atypical chest pain    a. 2003 Cath: reportedly nl per pt [name]);   b. 04/2015 [place] Admission, neg troponin.  . Colon cancer ([place]) 1999   a. 1999: Pt had partial colectomy. Did develop lung metastasis. Had right upper lobectomy in 01/2007 then had associated chemotherapy.  Marland Kitchen COPD (chronic obstructive pulmonary disease) ([place])   . DDD (degenerative disc disease), lumbosacral   . Depression   . Echocardiogram abnormal    a. 02/2010 Echo: EF 55-60%, mild LAE, no regional wall motion abnormalities  . Elevated transaminase level    a. ? NASH;  b. 06/2015 - nl LFTs.  . GERD (gastroesophageal reflux disease)   . Hematuria   . Holter monitor, abnormal    a. 12/2009 Holter monitoring: NSR, rare PACs and PVCs.  Marland Kitchen Hypercholesterolemia   . Hyperlipidemia   . Hypertension   . Leaky heart valve    a. Per pt report - no evidence of valvular abnormalities on prior echoes.  . Lesion of skin of Right breast 10/20/2017  . Lung metastases ([place]) 2008   a. S/P resection (lobectomy - right)  . Morbid obesity ([place])   . Nephrolithiasis   . Parkinson disease ([place]) 08/2016   [name]    Patient Active Problem List   Diagnosis Date Noted  . B12 deficiency 03/11/2018  . Breast pain, right 01/25/2018  . Discharge from right nipple 11/10/2017  . Goals of care, counseling/discussion 10/15/2017  . Parkinson's disease ([place]) 07/31/2017  . Chronic obstructive pulmonary disease ([place]) 07/31/2017  .  Elevated liver function tests 08/25/2016  . Carotid stenosis 07/19/2016  . Tremor 06/01/2016  . Osteoporosis 04/08/2016  . Foot pain, bilateral 07/24/2015  . Hyperlipidemia 06/21/2015  . Other fatigue 06/21/2015  . BMI 33.0-33.9,adult 06/21/2015  . Abnormal mammogram 06/19/2015  . Urinary system disease 03/28/2015  . Hyperglycemia 03/20/2015  . Swelling of lower extremity 11/20/2014  . Health care maintenance 07/17/2014  . Hypokalemia 04/05/2013  . TIA (transient ischemic attack) 02/09/2013  . Temporary cerebral vascular dysfunction 02/09/2013  . Anxiety, mild 03/13/2012  .  Depression, recurrent ([place]) 03/13/2012  . History of colon cancer 03/13/2012  . Hypertension 03/13/2012  . CKD (chronic kidney disease), stage III ([place]) 03/13/2012  . GERD (gastroesophageal reflux disease) 03/13/2012  . Acid reflux 03/13/2012  . History of malignant neoplasm metastatic to lung 03/13/2012  . Major depressive disorder with single episode 03/13/2012  . Hypercholesterolemia 02/02/2010  . Pure hypercholesterolemia 02/02/2010    Past Surgical History:  Procedure Laterality Date  . BREAST EXCISIONAL BIOPSY Right 10/07/2009   benign  . CARDIAC CATHETERIZATION  2003   negative as per pt report  . CHOLECYSTECTOMY  2000  . COLON SURGERY     Colon cancer, removed part of large colon  . COLONOSCOPY N/A 04/25/2015   Procedure: COLONOSCOPY;  Surgeon: Manya Silvas, MD;  Location: [facility] ENDOSCOPY;  Service: Endoscopy;  Laterality: N/A;  . COLONOSCOPY WITH PROPOFOL N/A 06/14/2017   Procedure: COLONOSCOPY WITH PROPOFOL;  Surgeon: Manya Silvas, MD;  Location: [facility] ENDOSCOPY;  Service: Endoscopy;  Laterality: N/A;  . LOBECTOMY  01/2007   Right lung, upper lobe right side removed  . TOTAL ABDOMINAL HYSTERECTOMY     abnormal bleeding  . TUBAL LIGATION  1980    Prior to Admission medications   Medication Sig Start Date End Date Taking? Authorizing Provider  ALPRAZolam Duanne Moron) 0.5 MG tablet TAKE 1/2 TABLET BY MOUTH DAILY AS NEEDEDFOR ANXIETY 03/31/18   Crecencio Mc, MD  aspirin EC 81 MG tablet Take 81 mg by mouth daily.    [provider]  carbidopa-levodopa (SINEMET IR) 25-100 MG tablet Take 2 tablets by mouth 3 (three) times daily.  07/30/17   [provider]  citalopram (CELEXA) 10 MG tablet TAKE 1 TABLET BY MOUTH DAILY. 02/04/18   Einar Pheasant, MD  fluticasone-salmeterol (ADVAIR HFA) [phone] MCG/ACT inhaler Inhale 2 puffs into the lungs 2 (two) times daily. 12/03/17   Wilhelmina Mcardle, MD  hydrochlorothiazide (HYDRODIURIL) 12.5 MG tablet Take 1 tablet (12.5  mg total) by mouth daily. 03/04/18   Jodelle Green, FNP  Multiple Vitamin (MULTIVITAMIN) tablet Take 1 tablet by mouth daily.    [provider]  omeprazole (PRILOSEC) 20 MG capsule TAKE 1 CAPSULE BY MOUTH EVERY DAY 03/31/18   Einar Pheasant, MD  polyethylene glycol powder (GLYCOLAX/MIRALAX) powder  06/03/17   [provider]  potassium chloride SA (K-DUR,KLOR-CON) 20 MEQ tablet TAKE ONE TABLET BY MOUTH TWICE A DAY 10/30/17   Einar Pheasant, MD  traZODone (DESYREL) 50 MG tablet TAKE 2 TABLETS BY MOUTH AT BEDTIME 01/27/18   Einar Pheasant, MD  VENTOLIN HFA 108 (90 Base) MCG/ACT inhaler INHALE 2 PUFFS BY MOUTH INTO THE LUNGS EVERY 6 HOURS AS NEEDED FOR WHEEZING 12/17/17   Einar Pheasant, MD    Allergies Macrobid [nitrofurantoin macrocrystal]; Antihistamines, diphenhydramine-type; Aspirin; Ceftin [cefuroxime axetil]; Celecoxib; Cymbalta [duloxetine hcl]; Diphenhydramine; Escitalopram; Lexapro [escitalopram oxalate]; Metronidazole; Nitrofurantoin; Zoloft [sertraline hcl]; and Ciprofloxacin  Family History  Problem Relation Age of Onset  .  Coronary artery disease Mother        died @ 66 of MI.  . Mental illness Mother   . Diabetes Mother   . Heart attack Father 4       MI  . Cancer Father        lung - died in his mid-70's  . Hypertension Father   . Diabetes Father   . Sudden death Brother   . Mental illness Brother   . Heart attack Brother        MI @ 36, died @ age 71.  . Breast cancer Neg Hx     Social History Social History   Tobacco Use  . Smoking status: Passive Smoke Exposure - Never Smoker  . Smokeless tobacco: Never Used  . Tobacco comment: husband and sons smoked in her home.  Substance Use Topics  . Alcohol use: Yes    Alcohol/week: 1.0 standard drinks    Types: 1 Glasses of wine per week    Comment: rare wine cooler.  . Drug use: No    Review of Systems Constitutional: No fever/chills Eyes: No visual changes presently but felt like a slight  fuzzy feeling in her vision earlier this resolved. ENT: No sore throat. Cardiovascular: Denies chest pain. Respiratory: Denies shortness of breath. Gastrointestinal: No abdominal pain.   Genitourinary: Negative for dysuria. Musculoskeletal: Negative for back pain. Skin: Negative for rash. Neurological: Negative for  areas of focal weakness or numbness.  No trouble speaking.  No weakness in the arms or legs.  No facial droop.    ____________________________________________   PHYSICAL EXAM:  VITAL SIGNS: ED Triage Vitals  Enc Vitals Group     BP 04/04/18 1851 (!) 166/71     Pulse Rate 04/04/18 1851 69     Resp 04/04/18 1851 18     Temp 04/04/18 1851 98.1 F (36.7 C)     Temp Source 04/04/18 1851 Oral     SpO2 04/04/18 1851 98 %     Weight 04/04/18 1852 193 lb (87.5 kg)     Height 04/04/18 1852 5\' 3"  (1.6 m)     Head Circumference --      Peak Flow --      Pain Score 04/04/18 1852 0     Pain Loc --      Pain Edu? --      Excl. in [place]? --     Constitutional: Alert and oriented. Well appearing and in no acute distress. Eyes: Conjunctivae are normal. Head: Atraumatic.  Reports her vision is normal and clear in both eyes.  Visual fields are normal.    No sub-conjunctival hemorrhages. Nose: No congestion/rhinnorhea. Mouth/Throat: Mucous membranes are moist. Neck: No stridor.  Cardiovascular: Normal rate, regular rhythm. Grossly normal heart sounds.  Good peripheral circulation. Respiratory: Normal respiratory effort.  No retractions. Lungs CTAB. Gastrointestinal: Soft and nontender. No distention. Musculoskeletal: No lower extremity tenderness nor edema. Neurologic:  Normal speech and language. No gross focal neurologic deficits are appreciated.  Normal cranial nerve examination.  No facial droop.  Clear speech.  Moves all extremities 5 out of 5 with normal strength and sensation. Skin:  Skin is warm, dry and intact. No rash noted. Psychiatric: Mood and affect are slightly  anxious, she reports she is anxious about her blood pressure. Speech and behavior are normal.  ____________________________________________   LABS (all labs ordered are listed, but only abnormal results are displayed)  Labs Reviewed  BASIC METABOLIC PANEL - Abnormal; Notable for the  following components:      Result Value   Glucose, Bld 122 (*)    Creatinine, Ser 1.46 (*)    GFR calc non Af Amer 36 (*)    GFR calc Af Amer 41 (*)    All other components within normal limits  CBC - Abnormal; Notable for the following components:   Hemoglobin 11.5 (*)    HCT 35.9 (*)    All other components within normal limits  TROPONIN I   ____________________________________________  EKG  Reviewed and entered by me at 1850 Heart rate 79 QRS 80 QTc 430 Normal sinus rhythm, no evidence of acute abnormality.  No ischemia denoted. ____________________________________________  RADIOLOGY  Dg Chest 2 View  Result Date: 04/04/2018 CLINICAL DATA:  Hypertension.  Blurred vision and headache EXAM: CHEST - 2 VIEW COMPARISON:  September 24, 2016 chest radiograph and chest CT March 07, 2018 FINDINGS: There is no edema or consolidation. Heart size and pulmonary vascularity are normal. There are surgical clips in the right hilar region. There is degenerative change in the thoracic spine. There is aortic atherosclerosis. IMPRESSION: No edema or consolidation. Postoperative change on the right. There is aortic atherosclerosis. Aortic Atherosclerosis (ICD10-I70.0). Electronically Signed   By: Lowella Grip III M.D.   On: 04/04/2018 19:35   Ct Head Wo Contrast  Result Date: 04/04/2018 CLINICAL DATA:  Dizziness and blurred vision.  Headache. EXAM: CT HEAD WITHOUT CONTRAST TECHNIQUE: Contiguous axial images were obtained from the base of the skull through the vertex without intravenous contrast. COMPARISON:  August 07, 2016 FINDINGS: Brain: Ventricles remain normal in size and configuration. Focal atrophy  near the frontal-parietal junction superiorly is stable. There is no evident intracranial mass, hemorrhage, extra-axial fluid collection, or midline shift. The brain parenchyma appears unremarkable. No evident acute infarct. Vascular: No hyperdense vessel. There is no appreciable vascular calcification. Skull: The bony calvarium appears intact. Sinuses/Orbits: There is mucosal thickening in several ethmoid air cells. Other visualized paranasal sinuses are clear. Orbits appear symmetric bilaterally. Other: Visualized mastoid air cells are clear. IMPRESSION: Stable localized atrophy at the superior frontal-parietal junction. Ventricles appear normal in size and configuration. Brain parenchyma appears unremarkable. There is mucosal thickening in several ethmoid air cells. Electronically Signed   By: Lowella Grip III M.D.   On: 04/04/2018 20:10   Chest x-ray reviewed, negative for acute disease.  Patient not have any acute pulmonary symptoms.  Additionally CT head demonstrates no acute abnormalities.  ____________________________________________   PROCEDURES  Procedure(s) performed: None  Procedures  Critical Care performed: No  ____________________________________________   INITIAL IMPRESSION / ASSESSMENT AND PLAN / ED COURSE  Pertinent labs & imaging results that were available during my care of the patient were reviewed by me and considered in my medical decision making (see chart for details).   Patient presents for evaluation of elevated blood pressures with associated feeling of fuzziness in her vision.  Reassuring normal neurologic examination at this time, no visual field abnormalities detected.  Reports all symptoms have improved.  In light of her symptoms, it sounds her blood pressure quite variable due to her Parkinson's and she has periods where her blood pressure will go low and she will feel like she can pass out.  At this point, I do not think we would want to consider  aggressively treating blood pressure out of the emergency department especially since she has periods where she has low blood pressures become symptomatic in the past.  She is on very low-dose hydrochlorothiazide and  reports small amount of Xanax actually relieves her symptoms.  Does feel anxious about it.  No signs of end organ dysfunction, no neurologic dysfunction.  Blood pressure 166/77.  Because of the associated visual symptoms which are now resolved I will check a CT of the head was read as normal without evidence of endorgan injury or hemorrhage, though my clinical suspicion remains low      ----------------------------------------- 8:16 PM on 04/04/2018 -----------------------------------------  CT the head reassuring.  Patient resting, medicated with small dose of Xanax which she takes at home.  Given her blood pressure slightly hypertensive but her given her history of variable blood pressures hesitant to start her on anything additional at this time.  Discussed careful return precautions with patient and family, agreeable with plan for discharge.  Return precautions and treatment recommendations and follow-up discussed with the patient who is agreeable with the plan.   ____________________________________________   FINAL CLINICAL IMPRESSION(S) / ED DIAGNOSES  Final diagnoses:  Hypertension, unspecified type        Note:  This document was prepared using Dragon voice recognition software and may include unintentional dictation errors       Delman Kitten, MD 04/04/18 2016

## 2018-04-04 NOTE — ED Triage Notes (Signed)
First Nurse Note:  Arrives from [facility] for ED evaluation of HTN and blurred vision x 2 days.  Patient is AAOx3.  MAE equally and strong.  Speech clear.  Facial movements equal bilaterally.

## 2018-04-04 NOTE — ED Notes (Signed)
Pt also reports worsening shortness of breath with exertion.

## 2018-04-04 NOTE — Telephone Encounter (Signed)
Called pt and advised that copay should be the same as being seen here. Patient stated she did not know that and would go there to be evaluated

## 2018-04-04 NOTE — ED Triage Notes (Signed)
Pt arrived via POV from [facility] with reports of blurry vision, and headache in the back of her head since last night. Pt states her BP was elevated last night with systolic in the 871L and 597I.  Pt denies any chest pain.  Pt also states that the artery on the left side of her neck feels bigger.

## 2018-04-04 NOTE — Telephone Encounter (Signed)
Pt. Reports her BP medication was changed the end of September, and now she is having elevated BP readings this week with headache.Requests to be seen today. States she can not afford to pay the co-pay for walk in clinic. Please advise pt.  Reason for Disposition . Systolic BP  >= 248 OR Diastolic >= 185  Answer Assessment - Initial Assessment Questions 1. BLOOD PRESSURE: "What is the blood pressure?" "Did you take at least two measurements 5 minutes apart?"     177/103   185/100 2. ONSET: "When did you take your blood pressure?"     This morning 3. HOW: "How did you obtain the blood pressure?" (e.g., visiting nurse, automatic home BP monitor)     Home BP monitor 4. HISTORY: "Do you have a history of high blood pressure?"     Yes 5. MEDICATIONS: "Are you taking any medications for blood pressure?" "Have you missed any doses recently?"     No missed doses 6. OTHER SYMPTOMS: "Do you have any symptoms?" (e.g., headache, chest pain, blurred vision, difficulty breathing, weakness)     Headache 7. PREGNANCY: "Is there any chance you are pregnant?" "When was your last menstrual period?"     No  Protocols used: HIGH BLOOD PRESSURE-A-AH

## 2018-04-04 NOTE — Telephone Encounter (Signed)
If headache and blood pressure elevated, agree with - needs to be evaluated.

## 2018-04-05 NOTE — Telephone Encounter (Signed)
Pt evaluated.

## 2018-04-14 DIAGNOSIS — H524 Presbyopia: Secondary | ICD-10-CM | POA: Diagnosis not present

## 2018-04-15 ENCOUNTER — Telehealth: Payer: Self-pay | Admitting: Internal Medicine

## 2018-04-15 NOTE — Telephone Encounter (Signed)
Called patient unable to leave voicemail  

## 2018-04-15 NOTE — Telephone Encounter (Signed)
B12 was just checked 02/2018.  It was low.  Does need to start B12 injections.  (should not need to recheck this soon).

## 2018-04-15 NOTE — Telephone Encounter (Signed)
Copied from [place] [id number]. Topic: Quick Communication - See Telephone Encounter >> Apr 15, 2018 10:44 AM Antonieta Iba C wrote: CRM for notification. See Telephone encounter for: 04/15/18.  Pt called in to schedule a ov for possible UTI. Pt says that she is also seen at the cancer center and have been taking B-12, pt would like to know if she could have her B-12 checked at tomorrows ov also? If okay, please assist pt with scheduling. Pt says if her B-12 level isn't were it should be her cancer Dr. Lenon Ahmadi that they would want pt to start injections.    CB: [phone]

## 2018-04-15 NOTE — Telephone Encounter (Signed)
Pt has an Appt with Lauren tomorrow. OK to check B12 level while she is here?

## 2018-04-16 ENCOUNTER — Encounter: Payer: Self-pay | Admitting: Family Medicine

## 2018-04-16 ENCOUNTER — Ambulatory Visit (INDEPENDENT_AMBULATORY_CARE_PROVIDER_SITE_OTHER): Payer: Medicare HMO | Admitting: Family Medicine

## 2018-04-16 VITALS — BP 132/78 | HR 80 | Temp 97.7°F | Ht 64.0 in | Wt 196.2 lb

## 2018-04-16 DIAGNOSIS — E538 Deficiency of other specified B group vitamins: Secondary | ICD-10-CM

## 2018-04-16 DIAGNOSIS — N390 Urinary tract infection, site not specified: Secondary | ICD-10-CM | POA: Diagnosis not present

## 2018-04-16 DIAGNOSIS — Z23 Encounter for immunization: Secondary | ICD-10-CM

## 2018-04-16 LAB — POCT URINALYSIS DIPSTICK
BILIRUBIN UA: NEGATIVE
Glucose, UA: NEGATIVE
Ketones, UA: NEGATIVE
NITRITE UA: POSITIVE
PH UA: 6.5 (ref 5.0–8.0)
PROTEIN UA: POSITIVE — AB
Spec Grav, UA: 1.02 (ref 1.010–1.025)
UROBILINOGEN UA: NEGATIVE U/dL — AB

## 2018-04-16 LAB — VITAMIN B12: Vitamin B-12: 1230 pg/mL — ABNORMAL HIGH (ref 211–911)

## 2018-04-16 MED ORDER — SULFAMETHOXAZOLE-TRIMETHOPRIM 800-160 MG PO TABS: 1.0000 | ORAL_TABLET | Freq: Two times a day (BID) | ORAL | 0 refills | Status: DC

## 2018-04-16 NOTE — Patient Instructions (Signed)

## 2018-04-16 NOTE — Progress Notes (Signed)
Subjective:    Patient ID: Victoria Hanson, female    DOB: [birth year], 67 y.o.   MRN: [id number]  HPI  Patient presents to clinic due to possible UTI. Has had increased frequency, burning and pressure with urination for 1 week. Denies fever or chills. Denies nausea, vomiting or diarrhea.   Patient also has history of B12 deficiency has been taking oral replacement as recommended by oncology, would like levels rechecked.  Patient states she was told that her levels do not come up oral replacement she most likely will have to start B12 injections.  Patient Active Problem List   Diagnosis Date Noted  . B12 deficiency 03/11/2018  . Breast pain, right 01/25/2018  . Discharge from right nipple 11/10/2017  . Goals of care, counseling/discussion 10/15/2017  . Parkinson's disease ([place]) 07/31/2017  . Chronic obstructive pulmonary disease ([place]) 07/31/2017  . Elevated liver function tests 08/25/2016  . Carotid stenosis 07/19/2016  . Tremor 06/01/2016  . Osteoporosis 04/08/2016  . Foot pain, bilateral 07/24/2015  . Hyperlipidemia 06/21/2015  . Other fatigue 06/21/2015  . BMI 33.0-33.9,adult 06/21/2015  . Abnormal mammogram 06/19/2015  . Urinary system disease 03/28/2015  . Hyperglycemia 03/20/2015  . Swelling of lower extremity 11/20/2014  . Health care maintenance 07/17/2014  . Hypokalemia 04/05/2013  . TIA (transient ischemic attack) 02/09/2013  . Temporary cerebral vascular dysfunction 02/09/2013  . Anxiety, mild 03/13/2012  . Depression, recurrent ([place]) 03/13/2012  . History of colon cancer 03/13/2012  . Hypertension 03/13/2012  . CKD (chronic kidney disease), stage III ([place]) 03/13/2012  . GERD (gastroesophageal reflux disease) 03/13/2012  . Acid reflux 03/13/2012  . History of malignant neoplasm metastatic to lung 03/13/2012  . Major depressive disorder with single episode 03/13/2012  . Hypercholesterolemia 02/02/2010  . Pure hypercholesterolemia 02/02/2010   Social History    Tobacco Use  . Smoking status: Passive Smoke Exposure - Never Smoker  . Smokeless tobacco: Never Used  . Tobacco comment: husband and sons smoked in her home.  Substance Use Topics  . Alcohol use: Yes    Alcohol/week: 1.0 standard drinks    Types: 1 Glasses of wine per week    Comment: rare wine cooler.    Review of Systems  Constitutional: Negative for chills, fatigue and fever.  HENT: Negative for congestion, ear pain, sinus pain and sore throat.   Eyes: Negative.   Respiratory: Negative for cough, shortness of breath and wheezing.   Cardiovascular: Negative for chest pain, palpitations and leg swelling.  Gastrointestinal: Negative for abdominal pain, diarrhea, nausea and vomiting.  Genitourinary:+dysuria, frequency and urgency.  Musculoskeletal: Negative for arthralgias and myalgias.  Skin: Negative for color change, pallor and rash.  Neurological: Negative for syncope, light-headedness and headaches.  Psychiatric/Behavioral: The patient is not nervous/anxious.       Objective:   Physical Exam  Constitutional:  She appears well-developed and well-nourished. No distress.  Head: Normocephalic and atraumatic.  Eyes:  EOM are normal. No scleral icterus.  Neck: Normal range of motion. Neck supple. No tracheal deviation present.  Cardiovascular: Normal rate, regular rhythm and normal heart sounds.  Pulmonary/Chest: Effort normal and breath sounds normal. No respiratory distress. She has no wheezes. She has no rales.  Abdominal: Soft. Bowel sounds are normal. Mild suprapubic tenderness. .  Neurological: She is alert and oriented to person, place, and time. Gait normal  Skin: Skin is warm and dry. No pallor.  Psychiatric: She has a normal mood and affect. Her behavior is normal.   Nursing  note and vitals reviewed.    Vitals:   04/16/18 1026  BP: 132/78  Pulse: 80  Temp: 97.7 F (36.5 C)  SpO2: 98%   Assessment & Plan:   Urinary tract infection-urinalysis is positive  for leukocytes, nitrites, blood and protein.  We will treat patient with Bactrim twice daily for 5 days.  We will plan patient to come back in 4 to 6 weeks for urine recheck to be sure the blood and protein has resolved.  Urine culture also collected today and sent to lab to be sure we are on correct antibiotic treatment.  B12 deficiency-we will recheck B12 levels today  High-dose flu vaccine given in clinic.  Keep regularly scheduled follow-up with PCP as planned.  Return to clinic sooner if issues arise.  Patient aware she will be contacted with results of urine culture and B12 testing.

## 2018-04-19 LAB — URINE CULTURE
MICRO NUMBER:: 91336582
SPECIMEN QUALITY:: ADEQUATE

## 2018-04-21 ENCOUNTER — Telehealth: Payer: Self-pay

## 2018-04-21 DIAGNOSIS — N390 Urinary tract infection, site not specified: Secondary | ICD-10-CM

## 2018-04-21 MED ORDER — SULFAMETHOXAZOLE-TRIMETHOPRIM 800-160 MG PO TABS: 1.0000 | ORAL_TABLET | Freq: Two times a day (BID) | ORAL | 0 refills | Status: AC

## 2018-04-21 NOTE — Telephone Encounter (Signed)
Copied from [place] [id number]. Topic: General - Other >> Apr 21, 2018  8:30 AM Carolyn Stare wrote:    Pt call to say she saw Philis Nettle on 04/16/18 for a UTI and was given a RX. She said she has finish the med and can tell that the med did not clear up the UTI, but it helped  Can leave a message   [place] Ap

## 2018-04-21 NOTE — Addendum Note (Signed)
Addended by: Philis Nettle on: 04/21/2018 10:18 AM   Modules accepted: Orders

## 2018-04-21 NOTE — Telephone Encounter (Signed)
Bactrim is sensitive on urine culture, I will send in another 5 day supply to make the full course 10 days to treat UTI

## 2018-04-21 NOTE — Telephone Encounter (Signed)
I called & left message with patient's daughter that prescription was sent in. I stated that I would try to call back later today.

## 2018-04-22 NOTE — Telephone Encounter (Signed)
I called & verified with patient that she did pick as well as started taking prescription.

## 2018-04-29 ENCOUNTER — Other Ambulatory Visit: Payer: Self-pay | Admitting: Internal Medicine

## 2018-05-02 ENCOUNTER — Ambulatory Visit (INDEPENDENT_AMBULATORY_CARE_PROVIDER_SITE_OTHER): Payer: Medicare HMO | Admitting: Internal Medicine

## 2018-05-02 ENCOUNTER — Encounter: Payer: Self-pay | Admitting: Internal Medicine

## 2018-05-02 VITALS — BP 126/78 | HR 73 | Temp 97.6°F | Resp 18 | Wt 198.4 lb

## 2018-05-02 DIAGNOSIS — N183 Chronic kidney disease, stage 3 unspecified: Secondary | ICD-10-CM

## 2018-05-02 DIAGNOSIS — E7800 Pure hypercholesterolemia, unspecified: Secondary | ICD-10-CM | POA: Diagnosis not present

## 2018-05-02 DIAGNOSIS — K219 Gastro-esophageal reflux disease without esophagitis: Secondary | ICD-10-CM | POA: Diagnosis not present

## 2018-05-02 DIAGNOSIS — F419 Anxiety disorder, unspecified: Secondary | ICD-10-CM | POA: Diagnosis not present

## 2018-05-02 DIAGNOSIS — F339 Major depressive disorder, recurrent, unspecified: Secondary | ICD-10-CM

## 2018-05-02 DIAGNOSIS — E538 Deficiency of other specified B group vitamins: Secondary | ICD-10-CM

## 2018-05-02 DIAGNOSIS — I10 Essential (primary) hypertension: Secondary | ICD-10-CM | POA: Diagnosis not present

## 2018-05-02 DIAGNOSIS — R319 Hematuria, unspecified: Secondary | ICD-10-CM | POA: Diagnosis not present

## 2018-05-02 DIAGNOSIS — R739 Hyperglycemia, unspecified: Secondary | ICD-10-CM

## 2018-05-02 DIAGNOSIS — J449 Chronic obstructive pulmonary disease, unspecified: Secondary | ICD-10-CM

## 2018-05-02 DIAGNOSIS — G20 Parkinson's disease: Secondary | ICD-10-CM

## 2018-05-02 LAB — URINALYSIS, ROUTINE W REFLEX MICROSCOPIC
Bilirubin Urine: NEGATIVE
Hgb urine dipstick: NEGATIVE
Ketones, ur: NEGATIVE
Leukocytes, UA: NEGATIVE
NITRITE: NEGATIVE
PH: 5 (ref 5.0–8.0)
RBC / HPF: NONE SEEN (ref 0–?)
SPECIFIC GRAVITY, URINE: 1.01 (ref 1.000–1.030)
TOTAL PROTEIN, URINE-UPE24: NEGATIVE
URINE GLUCOSE: NEGATIVE
Urobilinogen, UA: 0.2 (ref 0.0–1.0)

## 2018-05-02 LAB — BASIC METABOLIC PANEL
BUN: 18 mg/dL (ref 6–23)
CALCIUM: 9.5 mg/dL (ref 8.4–10.5)
CO2: 29 mEq/L (ref 19–32)
CREATININE: 1.21 mg/dL — AB (ref 0.40–1.20)
Chloride: 102 mEq/L (ref 96–112)
GFR: 46.97 mL/min — AB (ref >60.00)
Glucose, Bld: 99 mg/dL (ref 70–99)
Potassium: 3.9 mEq/L (ref 3.5–5.1)
Sodium: 137 mEq/L (ref 135–145)

## 2018-05-02 NOTE — Progress Notes (Signed)
Patient ID: ZARINA PE, female   DOB: 1951/05/20, 67 y.o.   MRN: [id number]   Victoria Hanson, female    DOB: [birth year], 68 y.o.   MRN: [id number]  HPI  Patient here for a scheduled follow up.  Overall doing better.  Feels better.  On hctz 12.60m daily now.  Blood pressure doing well.  On citalopram (1/2 153mdaily).  Feels this is helping with increased stress and anxiety. Doing well on the medication.  Tries to stay active.  No chest pain.  No sob.  No acid reflux.  No abdominal pain.  Bowels moving.  On oral b12.  Recently treated for UTI.  Symptoms resolved.  Would like to have urine checked to confirm cleared.  Has documented COPD.  Saw pulmonary.  PFTs stable.     Past Medical History:  Diagnosis Date  . Anxiety   . Asthma   . Atypical chest pain    a. 2003 Cath: reportedly nl per pt ([facility]  b. [facility] trop[facility].  . Colon cancer ([facility]   a. 1999: Pt had partial colectomy. Did develop lung metastasis. Had right upper lobectomy in 01/2007 then had associated chemotherapy.  . Marland KitchenOPD (chronic obstructive pulmonary disease) ([place]  . DDD (degenerative disc disease), lumbosacral   . Depression   . Echocardiogram abnormal    a. 02/2010 Echo: EF 55-60%, mild LAE, no regional wall motion abnormalities  . Elevated transaminase level    a. ? NASH;  b. 06/2015 - nl LFTs.  . GERD (gastroesophageal reflux disease)   . Hematuria   . Holter monitor, abnormal    a. 12/2009 Holter monitoring: NSR, rare PACs and PVCs.  . Marland Kitchenypercholesterolemia   . Hyperlipidemia   . Hypertension   . Leaky heart valve    a. Per pt report - no evidence of valvular abnormalities on prior echoes.  . Lesion of skin of Right breast 10/20/2017  . Lung metastases (HCD[place] Leon2008   a. S/P resection (lobectomy - right)  . Morbid obesity ([facility]  . Nephrolithiasis   . Parkinson disease (HCHalf[place]03/2018   [name]   Past Surgical History:  Procedure Laterality Date  .  BREAST EXCISIONAL BIOPSY Right 10/07/2009   benign  . CARDIAC CATHETERIZATION  2003   negative as per pt report  . CHOLECYSTECTOMY  2000  . COLON SURGERY     Colon cancer, removed part of large colon  . COLONOSCOPY N/A 04/25/2015   Procedure: COLONOSCOPY;  Surgeon: RoManya SilvasMD;  [facility];  Service: Endoscopy;  Laterality: N/A;  . COLONOSCOPY WITH PROPOFOL N/A 06/14/2017   Procedure: COLONOSCOPY WITH PROPOFOL;  Surgeon: ElManya SilvasMD;  [facility];  Service: Endoscopy;  Laterality: N/A;  . LOBECTOMY  01/2007   Right lung, upper lobe right side removed  . TOTAL ABDOMINAL HYSTERECTOMY     abnormal bleeding  . TUBAL LIGATION  1980   Family History  Problem Relation Age of Onset  . Coronary artery disease Mother        died @ 6587f MI.  . Mental illness Mother   . Diabetes Mother   . Heart attack Father [id number]     MI  . Cancer Father        lung - died in his mid-70's  . Hypertension Father   . Diabetes Father   . Sudden death Brother   . Mental illness Brother   .  Heart attack Brother        MI @ 42, died @ age 37.  . Breast cancer Neg Hx    Social History   Socioeconomic History  . Marital status: Widowed    Spouse name: Not on file  . Number of children: Not on file  . Years of education: Not on file  . Highest education level: Not on file  Occupational History  . Not on file  Social Needs  . Financial resource strain: Not on file  . Food insecurity:    Worry: Not on file    Inability: Not on file  . Transportation needs:    Medical: Not on file    Non-medical: Not on file  Tobacco Use  . Smoking status: Passive Smoke Exposure - Never Smoker  . Smokeless tobacco: Never Used  . Tobacco comment: husband and sons smoked in her home.  Substance and Sexual Activity  . Alcohol use: Yes    Alcohol/week: 1.0 standard drinks    Types: 1 Glasses of wine per week    Comment: rare wine cooler.  . Drug use: No  . Sexual  activity: Never  Lifestyle  . Physical activity:    Days per week: Not on file    Minutes per session: Not on file  . Stress: Not on file  Relationships  . Social connections:    Talks on phone: Not on file    Gets together: Not on file    Attends religious service: Not on file    Active member of club or organization: Not on file    Attends meetings of clubs or organizations: Not on file    Relationship status: Not on file  Other Topics Concern  . Not on file  Social History Narrative   Widowed.   Disabled (colon/lung CA), retired.   Lives in [place] with her dtr, grand-dtr, and son.   Active.   Gets regular exercise, walks.    Outpatient Encounter Medications as of 05/02/2018  Medication Sig  . ALPRAZolam (XANAX) 0.5 MG tablet TAKE 1/2 TABLET BY MOUTH DAILY AS NEEDEDFOR ANXIETY  . aspirin EC 81 MG tablet Take 81 mg by mouth daily.  . carbidopa-levodopa (SINEMET IR) 25-100 MG tablet Take 2 tablets by mouth 3 (three) times daily.   . citalopram (CELEXA) 10 MG tablet TAKE 1 TABLET BY MOUTH DAILY.  . fluticasone-salmeterol (ADVAIR HFA) 45-21 MCG/ACT inhaler Inhale 2 puffs into the lungs 2 (two) times daily.  . hydrochlorothiazide (HYDRODIURIL) 12.5 MG tablet Take 1 tablet (12.5 mg total) by mouth daily.  . Multiple Vitamin (MULTIVITAMIN) tablet Take 1 tablet by mouth daily.  Marland Kitchen omeprazole (PRILOSEC) 20 MG capsule TAKE 1 CAPSULE BY MOUTH EVERY DAY  . polyethylene glycol powder (GLYCOLAX/MIRALAX) powder   . potassium chloride SA (K-DUR,KLOR-CON) 20 MEQ tablet TAKE 1 TABLET BY MOUTH TWICE A DAY  . traZODone (DESYREL) 50 MG tablet TAKE 2 TABLETS BY MOUTH AT BEDTIME  . VENTOLIN HFA 108 (90 Base) MCG/ACT inhaler INHALE 2 PUFFS BY MOUTH INTO THE LUNGS EVERY 6 HOURS AS NEEDED FOR WHEEZING  . vitamin B-12 (CYANOCOBALAMIN) 100 MCG tablet Take 100 mcg by mouth daily.   No facility-administered encounter medications on file as of 05/02/2018.     Review of Systems  Constitutional:  Negative for appetite change and unexpected weight change.  HENT: Negative for congestion and sinus pressure.   Respiratory: Negative for cough, chest tightness and shortness of breath.   Cardiovascular: Negative for chest  pain, palpitations and leg swelling.  Gastrointestinal: Negative for abdominal pain, diarrhea, nausea and vomiting.  Genitourinary: Negative for difficulty urinating and dysuria.  Musculoskeletal: Negative for joint swelling and myalgias.  Skin: Negative for color change and rash.  Neurological: Negative for dizziness, light-headedness and headaches.  Psychiatric/Behavioral: Negative for agitation and dysphoric mood.       Objective:    Physical Exam  Constitutional: She appears well-developed and well-nourished. No distress.  HENT:  Nose: Nose normal.  Mouth/Throat: Oropharynx is clear and moist.  Neck: Neck supple. No thyromegaly present.  Cardiovascular: Normal rate and regular rhythm.  Pulmonary/Chest: Breath sounds normal. No respiratory distress. She has no wheezes.  Abdominal: Soft. Bowel sounds are normal. There is no tenderness.  Musculoskeletal: She exhibits no edema or tenderness.  Lymphadenopathy:    She has no cervical adenopathy.  Skin: No rash noted. No erythema.  Psychiatric: She has a normal mood and affect. Her behavior is normal.    BP 126/78 (BP Location: Left Arm, Patient Position: Sitting, Cuff Size: Normal)   Pulse 73   Temp 97.6 F (36.4 C) (Oral)   Resp 18   Wt 198 lb 6.4 oz (90 kg)   SpO2 98%   BMI 34.06 kg/m  Wt Readings from Last 3 Encounters:  05/02/18 198 lb 6.4 oz (90 kg)  04/16/18 196 lb 3.2 oz (89 kg)  04/04/18 193 lb (87.5 kg)     Lab Results  Component Value Date   WBC 8.1 04/04/2018   HGB 11.5 (L) 04/04/2018   HCT 35.9 (L) 04/04/2018   PLT 246 04/04/2018   GLUCOSE 99 05/02/2018   CHOL 213 (H) 10/07/2017   TRIG 134.0 10/07/2017   HDL 55.70 10/07/2017   LDLDIRECT 163.4 12/31/2012   LDLCALC 130 (H)  10/07/2017   ALT <5 03/10/2018   AST 20 03/10/2018   NA 137 05/02/2018   K 3.9 05/02/2018   CL 102 05/02/2018   CREATININE 1.21 (H) 05/02/2018   BUN 18 05/02/2018   CO2 29 05/02/2018   TSH 2.81 03/04/2018   INR 1.0 12/12/2012   HGBA1C 6.2 01/20/2018    Dg Chest 2 View  Result Date: 04/04/2018 CLINICAL DATA:  Hypertension.  Blurred vision and headache EXAM: CHEST - 2 VIEW COMPARISON:  September 24, 2016 chest radiograph and chest CT March 07, 2018 FINDINGS: There is no edema or consolidation. Heart size and pulmonary vascularity are normal. There are surgical clips in the right hilar region. There is degenerative change in the thoracic spine. There is aortic atherosclerosis. IMPRESSION: No edema or consolidation. Postoperative change on the right. There is aortic atherosclerosis. Aortic Atherosclerosis (ICD10-I70.0). Electronically Signed   By: Lowella Grip III M.D.   On: 04/04/2018 19:35   Ct Head Wo Contrast  Result Date: 04/04/2018 CLINICAL DATA:  Dizziness and blurred vision.  Headache. EXAM: CT HEAD WITHOUT CONTRAST TECHNIQUE: Contiguous axial images were obtained from the base of the skull through the vertex without intravenous contrast. COMPARISON:  August 07, 2016 FINDINGS: Brain: Ventricles remain normal in size and configuration. Focal atrophy near the frontal-parietal junction superiorly is stable. There is no evident intracranial mass, hemorrhage, extra-axial fluid collection, or midline shift. The brain parenchyma appears unremarkable. No evident acute infarct. Vascular: No hyperdense vessel. There is no appreciable vascular calcification. Skull: The bony calvarium appears intact. Sinuses/Orbits: There is mucosal thickening in several ethmoid air cells. Other visualized paranasal sinuses are clear. Orbits appear symmetric bilaterally. Other: Visualized mastoid air cells are clear. IMPRESSION: Stable  localized atrophy at the superior frontal-parietal junction. Ventricles  appear normal in size and configuration. Brain parenchyma appears unremarkable. There is mucosal thickening in several ethmoid air cells. Electronically Signed   By: Lowella Grip III M.D.   On: 04/04/2018 20:10       Assessment & Plan:   Problem List Items Addressed This Visit    Anxiety, mild    On citalopram.  Stable. Doing better on the medication.  Follow.        B12 deficiency    On oral B12.       Chronic obstructive pulmonary disease (HCC)    Evaluated by pulmonary.  PFTs stable.  Breathing stable.        CKD (chronic kidney disease), stage III ([place])    Follow met b.        Depression, recurrent ([place])    Doing well on citalopram.  Follow.        GERD (gastroesophageal reflux disease)    Controlled on current regimen.  Follow.        Hypercholesterolemia    Did not tolerate crestor.  Low cholesterol diet and exercise.  Follow lipid panel.        Hyperglycemia    Low carb diet and exercise.  Follow met b and a1c.       Hypertension - Primary    On hctz 12.46m q day now.  Follow pressures.  Follow metabolic panel.        Relevant Orders   Basic metabolic panel (Completed)   Parkinson's disease ([facility]    On sinemet.  Followed by neurology.         Other Visit Diagnoses    Hematuria, unspecified type       Recently treated for UTI.  Recheck urine to confirm blood clear and no infection.     Relevant Orders   Urinalysis, Routine w reflex microscopic (Completed)   Urine Culture (Completed)       CEinar Pheasant MD

## 2018-05-03 LAB — URINE CULTURE
MICRO NUMBER:: 91411257
RESULT: NO GROWTH
SPECIMEN QUALITY:: ADEQUATE

## 2018-05-11 ENCOUNTER — Encounter: Payer: Self-pay | Admitting: Internal Medicine

## 2018-05-11 NOTE — Assessment & Plan Note (Signed)
Controlled on current regimen.  Follow.  

## 2018-05-11 NOTE — Assessment & Plan Note (Signed)
On oral B12.

## 2018-05-11 NOTE — Assessment & Plan Note (Signed)
On sinemet.  Followed by neurology.

## 2018-05-11 NOTE — Assessment & Plan Note (Signed)
Low carb diet and exercise.  Follow met b and a1c.  

## 2018-05-11 NOTE — Assessment & Plan Note (Signed)
On hctz 12.5mg  q day now.  Follow pressures.  Follow metabolic panel.

## 2018-05-11 NOTE — Assessment & Plan Note (Signed)
Doing well on citalopram.  Follow.

## 2018-05-11 NOTE — Assessment & Plan Note (Signed)
Did not tolerate crestor.  Low cholesterol diet and exercise.  Follow lipid panel.

## 2018-05-11 NOTE — Assessment & Plan Note (Signed)
Evaluated by pulmonary.  PFTs stable.  Breathing stable.

## 2018-05-11 NOTE — Assessment & Plan Note (Signed)
On citalopram.  Stable. Doing better on the medication.  Follow.

## 2018-05-11 NOTE — Assessment & Plan Note (Signed)
Follow met b.   

## 2018-05-20 IMAGING — CR DG CHEST 2V
1 series · 2 of 2 positions shown · non-contrast
Comparison: 03/10/2016

CLINICAL DATA: Shortness of breath, cough, and heart palpitations.
Bilateral lower extremity edema.

EXAM:
CHEST  2 VIEW

[Series 1: w chest pa · 0.14mm/px · 2 of 2 slices shown]
[im 1/2]
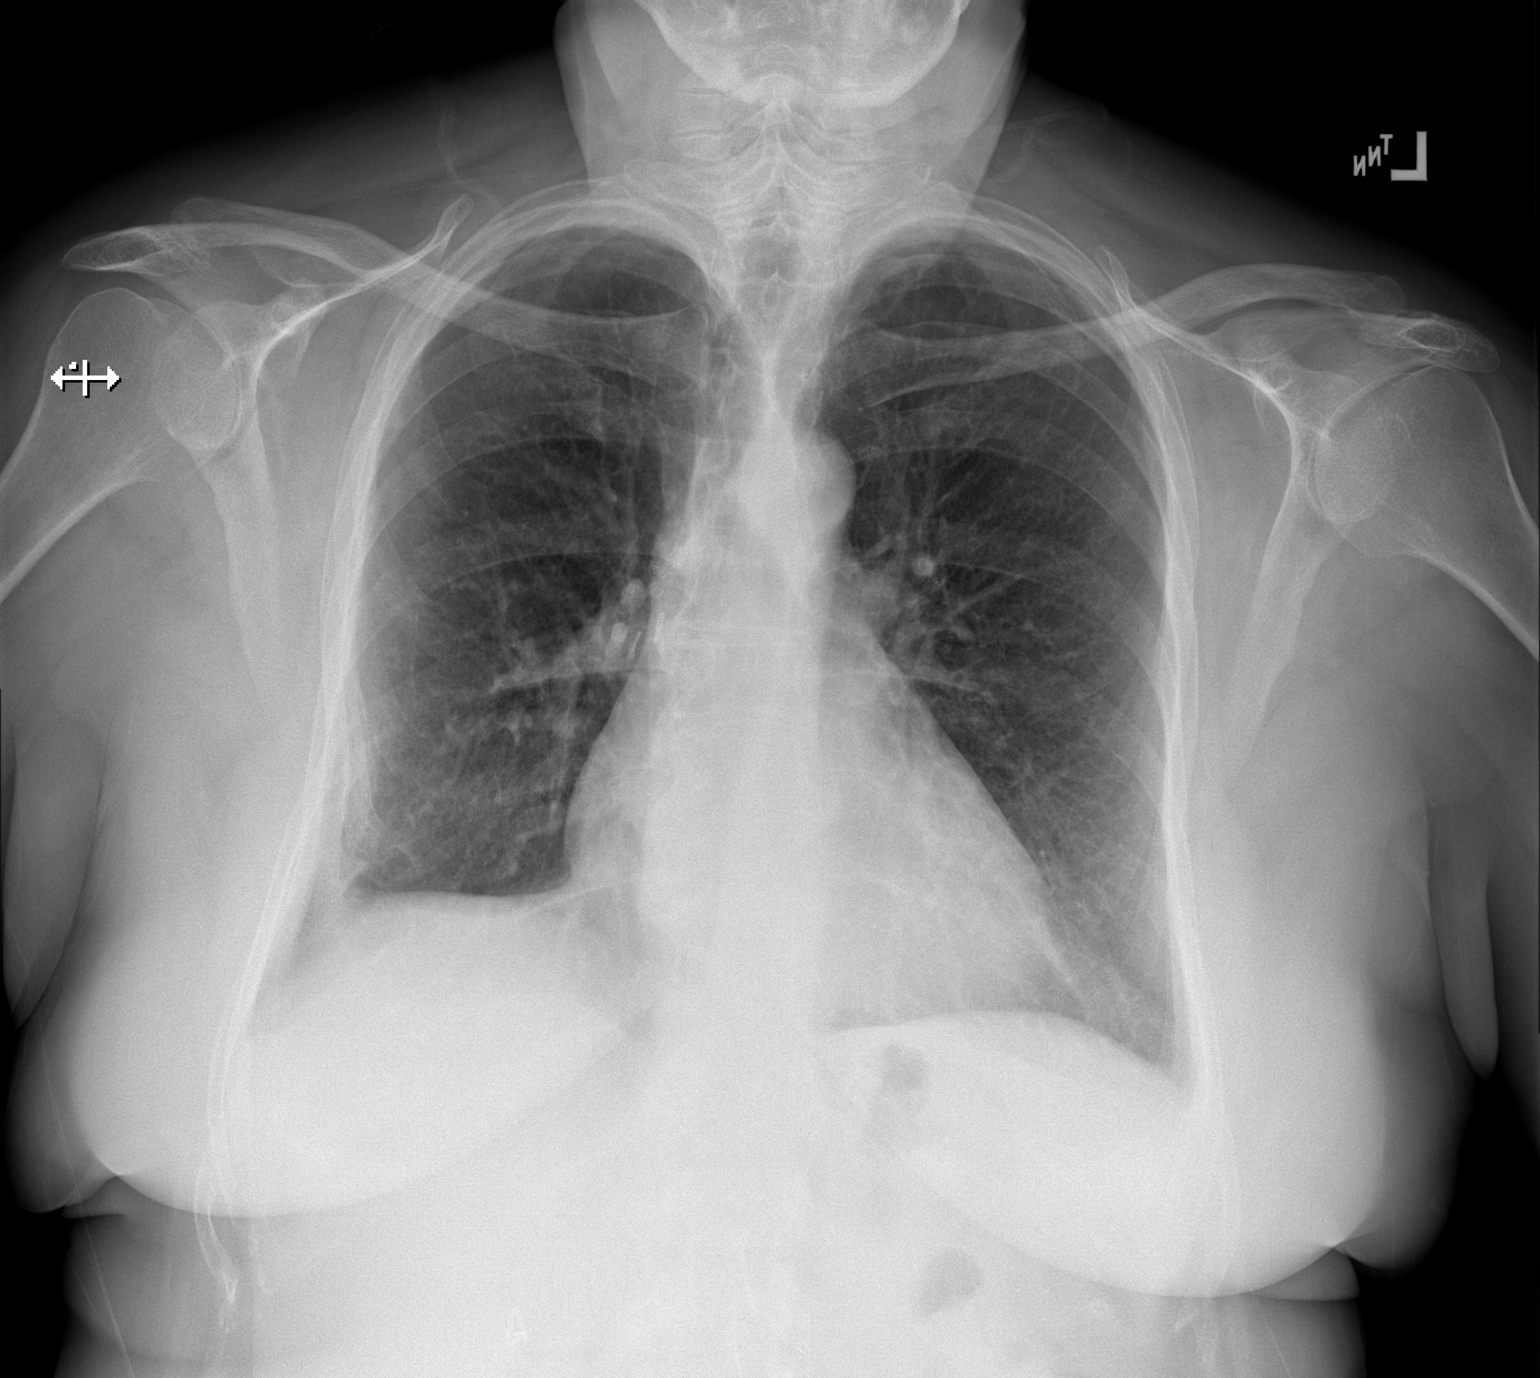
[im 2/2]
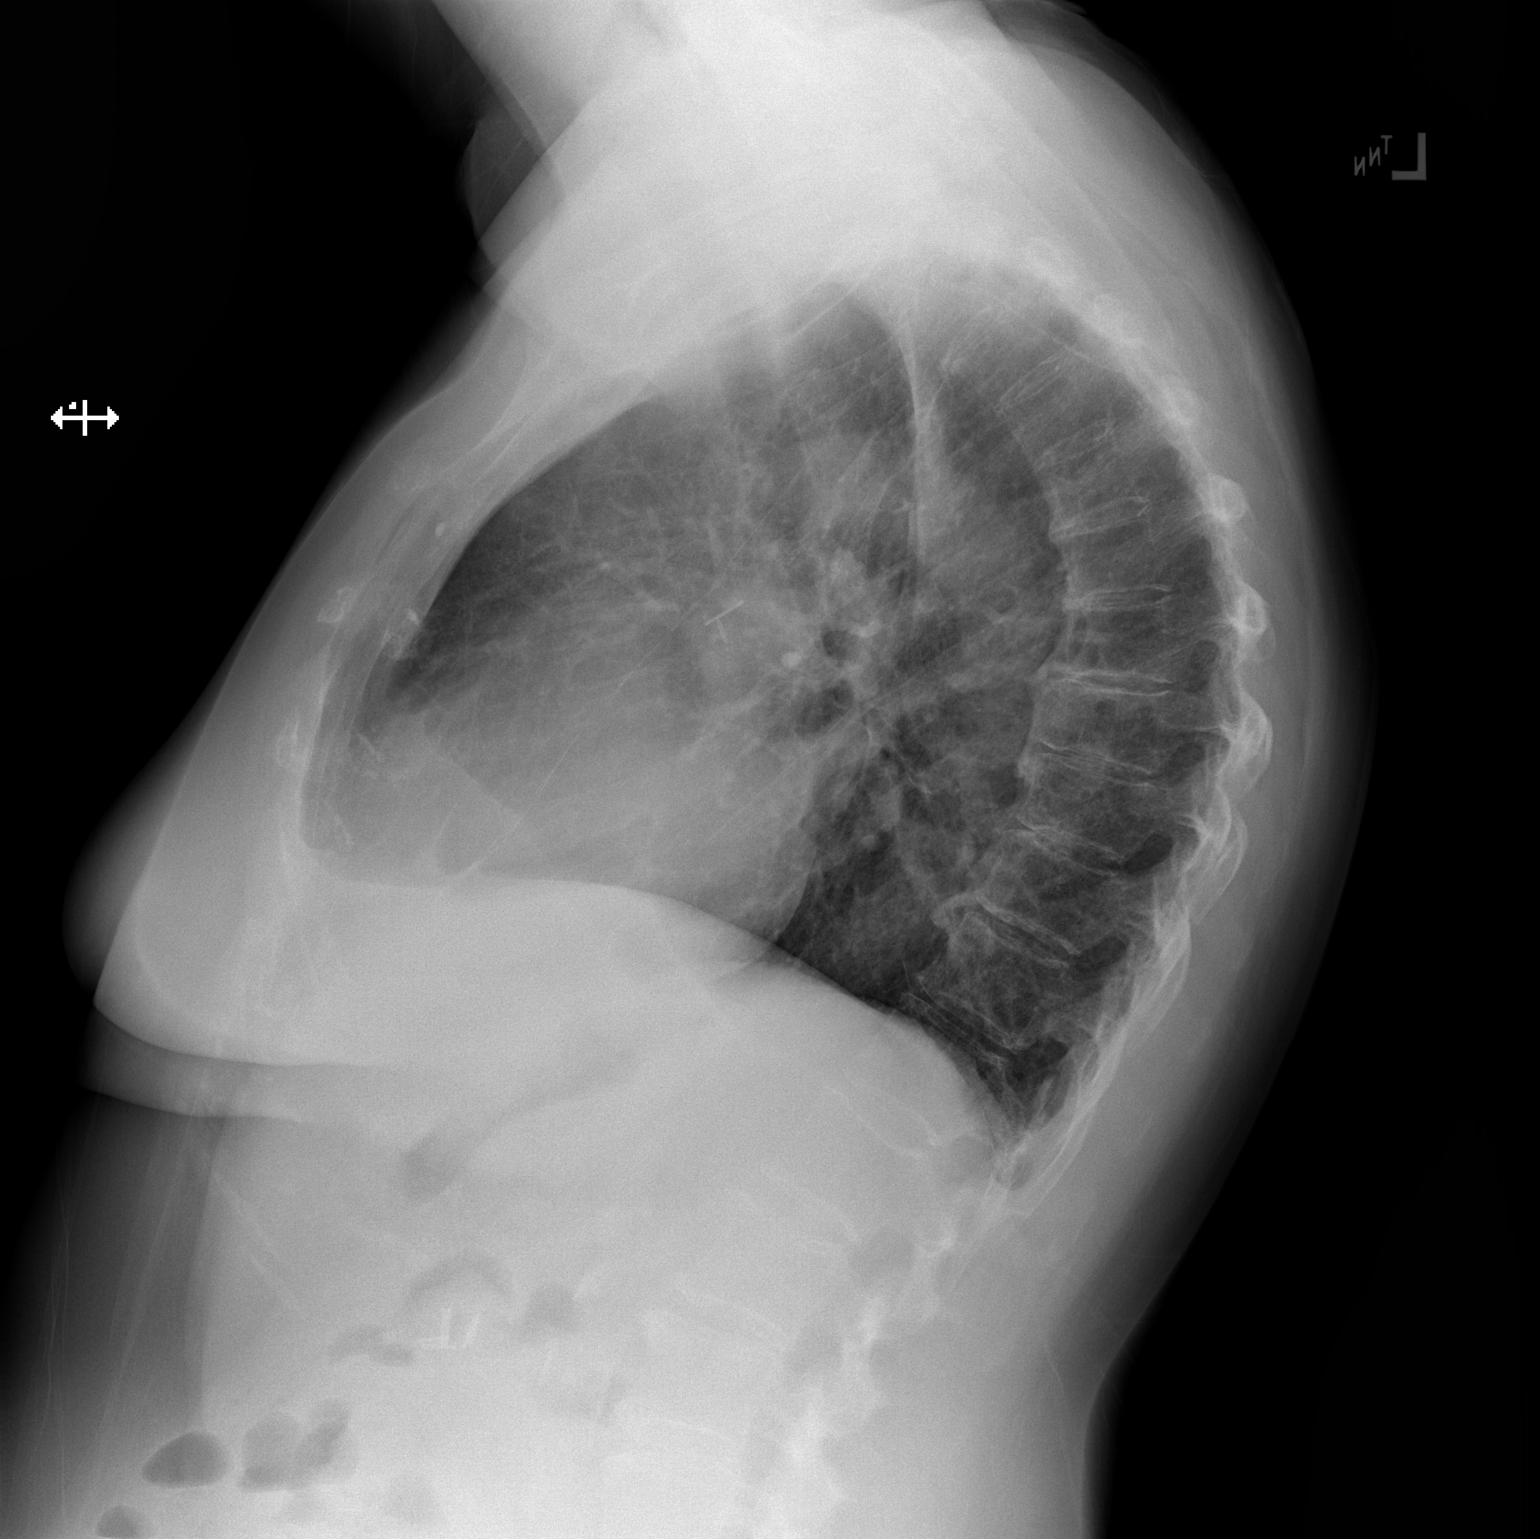

[2 of 2 positions shown; findings below may reference images not displayed]

FINDINGS: Heart size and pulmonary vascularity are normal. Surgical clips in
the right hilum. Chronic scarring at the right lung base laterally.
No infiltrates or effusions. No acute bone abnormality.
IMPRESSION: No active cardiopulmonary disease.

## 2018-05-28 ENCOUNTER — Other Ambulatory Visit: Payer: Self-pay | Admitting: Internal Medicine

## 2018-05-28 ENCOUNTER — Ambulatory Visit: Payer: Medicare HMO | Admitting: Pulmonary Disease

## 2018-05-28 DIAGNOSIS — F339 Major depressive disorder, recurrent, unspecified: Secondary | ICD-10-CM

## 2018-05-30 ENCOUNTER — Ambulatory Visit: Payer: Medicare HMO

## 2018-06-06 ENCOUNTER — Ambulatory Visit: Payer: Medicare HMO

## 2018-06-16 DIAGNOSIS — Z8659 Personal history of other mental and behavioral disorders: Secondary | ICD-10-CM | POA: Diagnosis not present

## 2018-06-16 DIAGNOSIS — F0280 Dementia in other diseases classified elsewhere without behavioral disturbance: Secondary | ICD-10-CM | POA: Diagnosis not present

## 2018-06-16 DIAGNOSIS — G20 Parkinson's disease: Secondary | ICD-10-CM | POA: Diagnosis not present

## 2018-06-16 DIAGNOSIS — R413 Other amnesia: Secondary | ICD-10-CM | POA: Diagnosis not present

## 2018-06-16 DIAGNOSIS — G479 Sleep disorder, unspecified: Secondary | ICD-10-CM | POA: Diagnosis not present

## 2018-06-16 DIAGNOSIS — G4752 REM sleep behavior disorder: Secondary | ICD-10-CM | POA: Diagnosis not present

## 2018-06-21 ENCOUNTER — Other Ambulatory Visit: Payer: Self-pay

## 2018-06-21 ENCOUNTER — Encounter: Payer: Self-pay | Admitting: Emergency Medicine

## 2018-06-21 DIAGNOSIS — Z7722 Contact with and (suspected) exposure to environmental tobacco smoke (acute) (chronic): Secondary | ICD-10-CM | POA: Diagnosis not present

## 2018-06-21 DIAGNOSIS — G20 Parkinson's disease: Secondary | ICD-10-CM | POA: Insufficient documentation

## 2018-06-21 DIAGNOSIS — J449 Chronic obstructive pulmonary disease, unspecified: Secondary | ICD-10-CM | POA: Insufficient documentation

## 2018-06-21 DIAGNOSIS — I10 Essential (primary) hypertension: Secondary | ICD-10-CM | POA: Diagnosis not present

## 2018-06-21 DIAGNOSIS — N183 Chronic kidney disease, stage 3 (moderate): Secondary | ICD-10-CM | POA: Diagnosis not present

## 2018-06-21 DIAGNOSIS — R51 Headache: Secondary | ICD-10-CM | POA: Diagnosis not present

## 2018-06-21 DIAGNOSIS — Z79899 Other long term (current) drug therapy: Secondary | ICD-10-CM | POA: Insufficient documentation

## 2018-06-21 DIAGNOSIS — I129 Hypertensive chronic kidney disease with stage 1 through stage 4 chronic kidney disease, or unspecified chronic kidney disease: Secondary | ICD-10-CM | POA: Diagnosis not present

## 2018-06-21 DIAGNOSIS — Z7982 Long term (current) use of aspirin: Secondary | ICD-10-CM | POA: Diagnosis not present

## 2018-06-21 NOTE — ED Triage Notes (Signed)
Pt arrives POV and ambulatory to triage with c/o hypertension and HA which began around 1500-1600. Pt reports that "my BP was 100's over 100 something". Pt also reports HA and taking x3 baby aspirin. Pt is in NAD.

## 2018-06-22 ENCOUNTER — Emergency Department
Admission: EM | Admit: 2018-06-22 | Discharge: 2018-06-22 | Disposition: A | Discharge status: Home / Self Care | Payer: Medicare HMO | Attending: Emergency Medicine | Admitting: Emergency Medicine

## 2018-06-22 ENCOUNTER — Emergency Department: Payer: Medicare HMO

## 2018-06-22 DIAGNOSIS — R51 Headache: Secondary | ICD-10-CM | POA: Diagnosis not present

## 2018-06-22 DIAGNOSIS — I10 Essential (primary) hypertension: Secondary | ICD-10-CM

## 2018-06-22 LAB — CBC WITH DIFFERENTIAL/PLATELET
Abs Immature Granulocytes: 0.02 10*3/uL (ref 0.00–0.07)
BASOS ABS: 0 10*3/uL (ref 0.0–0.1)
Basophils Relative: 0 %
Eosinophils Absolute: 0.2 10*3/uL (ref 0.0–0.5)
Eosinophils Relative: 1 %
HEMATOCRIT: 35.2 % — AB (ref 36.0–46.0)
Hemoglobin: 11.2 g/dL — ABNORMAL LOW (ref 12.0–15.0)
Immature Granulocytes: 0 %
LYMPHS PCT: 27 %
Lymphs Abs: 3 10*3/uL (ref 0.7–4.0)
MCH: 28.2 pg (ref 26.0–34.0)
MCHC: 31.8 g/dL (ref 30.0–36.0)
MCV: 88.7 fL (ref 80.0–100.0)
Monocytes Absolute: 0.7 10*3/uL (ref 0.1–1.0)
Monocytes Relative: 6 %
NEUTROS ABS: 7.4 10*3/uL (ref 1.7–7.7)
NEUTROS PCT: 66 %
Platelets: 238 10*3/uL (ref 150–400)
RBC: 3.97 MIL/uL (ref 3.87–5.11)
RDW: 12.9 % (ref 11.5–15.5)
WBC: 11.2 10*3/uL — AB (ref 4.0–10.5)
nRBC: 0 % (ref 0.0–0.2)

## 2018-06-22 LAB — BASIC METABOLIC PANEL
Anion gap: 10 (ref 5–15)
BUN: 16 mg/dL (ref 8–23)
CHLORIDE: 100 mmol/L (ref 98–111)
CO2: 24 mmol/L (ref 22–32)
Calcium: 8.8 mg/dL — ABNORMAL LOW (ref 8.9–10.3)
Creatinine, Ser: 1.11 mg/dL — ABNORMAL HIGH (ref 0.44–1.00)
GFR calc non Af Amer: 51 mL/min — ABNORMAL LOW (ref >60)
GFR, EST AFRICAN AMERICAN: 59 mL/min — AB (ref >60)
Glucose, Bld: 119 mg/dL — ABNORMAL HIGH (ref 70–99)
Potassium: 4 mmol/L (ref 3.5–5.1)
SODIUM: 134 mmol/L — AB (ref 135–145)

## 2018-06-22 LAB — TROPONIN I

## 2018-06-22 NOTE — ED Notes (Signed)
Peripheral IV discontinued. Catheter intact. No signs of infiltration or redness. Gauze applied to IV site.   Discharge instructions reviewed with patient. Questions fielded by this RN. Patient verbalizes understanding of instructions. Patient discharged home in stable condition per siadecki. No acute distress noted at time of discharge.

## 2018-06-22 NOTE — Discharge Instructions (Addendum)
Keep a log of your blood pressures over the next several days.  Call your primary care doctor on Monday to make a follow-up appointment and decide if the dose of your blood pressure medication should be changed or if another medication should be added.  Return to the ER for severely elevated blood pressures especially over 200 on the top number or 120 on the bottom number, chest pain, severe headache, vomiting, shortness of breath, or any other new or worsening symptoms that concern you.

## 2018-06-22 NOTE — ED Provider Notes (Signed)
[facility] Emergency Department Provider Note ____________________________________________   First MD Initiated Contact with Patient 06/22/18 0107     (approximate)  I have reviewed the triage vital signs and the nursing notes.   HISTORY  Chief Complaint Hypertension and Headache    HPI Victoria Hanson is a 68 y.o. female with PMH as noted below who presents with hypertension, measured to as high as 190s over 100 today at home, associated with a frontal pressure-like headache, but not associated with chest pain or acute shortness of breath.  The patient states that she is on a low-dose of an antihypertensive because her Parkinson's medication also drops her blood pressure.  She states in the past when she has had elevated blood pressure like this she takes a few baby aspirin and a Xanax and it goes away.  However, today she did so twice and the hypertension remained.  Past Medical History:  Diagnosis Date  . Anxiety   . Asthma   . Atypical chest pain    a. 2003 Cath: reportedly nl per pt [name]);  b. 04/2015 [place] Admission, neg troponin.  . Colon cancer ([place]) 1999   a. 1999: Pt had partial colectomy. Did develop lung metastasis. Had right upper lobectomy in 01/2007 then had associated chemotherapy.  Marland Kitchen COPD (chronic obstructive pulmonary disease) ([place])   . DDD (degenerative disc disease), lumbosacral   . Depression   . Echocardiogram abnormal    a. 02/2010 Echo: EF 55-60%, mild LAE, no regional wall motion abnormalities  . Elevated transaminase level    a. ? NASH;  b. 06/2015 - nl LFTs.  . GERD (gastroesophageal reflux disease)   . Hematuria   . Holter monitor, abnormal    a. 12/2009 Holter monitoring: NSR, rare PACs and PVCs.  Marland Kitchen Hypercholesterolemia   . Hyperlipidemia   . Hypertension   . Leaky heart valve    a. Per pt report - no evidence of valvular abnormalities on prior echoes.  . Lesion of skin of Right breast 10/20/2017  . Lung metastases  ([place]) 2008   a. S/P resection (lobectomy - right)  . Morbid obesity ([place])   . Nephrolithiasis   . Parkinson disease ([place]) 08/2016   [name]    Patient Active Problem List   Diagnosis Date Noted  . B12 deficiency 03/11/2018  . Breast pain, right 01/25/2018  . Discharge from right nipple 11/10/2017  . Goals of care, counseling/discussion 10/15/2017  . Parkinson's disease ([place]) 07/31/2017  . Chronic obstructive pulmonary disease ([place]) 07/31/2017  . Elevated liver function tests 08/25/2016  . Carotid stenosis 07/19/2016  . Tremor 06/01/2016  . Osteoporosis 04/08/2016  . Foot pain, bilateral 07/24/2015  . Hyperlipidemia 06/21/2015  . Other fatigue 06/21/2015  . BMI 33.0-33.9,adult 06/21/2015  . Abnormal mammogram 06/19/2015  . Urinary system disease 03/28/2015  . Hyperglycemia 03/20/2015  . Swelling of lower extremity 11/20/2014  . Health care maintenance 07/17/2014  . Hypokalemia 04/05/2013  . TIA (transient ischemic attack) 02/09/2013  . Temporary cerebral vascular dysfunction 02/09/2013  . Anxiety, mild 03/13/2012  . Depression, recurrent ([place]) 03/13/2012  . History of colon cancer 03/13/2012  . Hypertension 03/13/2012  . CKD (chronic kidney disease), stage III ([place]) 03/13/2012  . GERD (gastroesophageal reflux disease) 03/13/2012  . Acid reflux 03/13/2012  . History of malignant neoplasm metastatic to lung 03/13/2012  . Major depressive disorder with single episode 03/13/2012  . Hypercholesterolemia 02/02/2010  . Pure hypercholesterolemia 02/02/2010    Past Surgical History:  Procedure  Laterality Date  . BREAST EXCISIONAL BIOPSY Right 10/07/2009   benign  . CARDIAC CATHETERIZATION  2003   negative as per pt report  . CHOLECYSTECTOMY  2000  . COLON SURGERY     Colon cancer, removed part of large colon  . COLONOSCOPY N/A 04/25/2015   Procedure: COLONOSCOPY;  Surgeon: Manya Silvas, MD;  [facility] ENDOSCOPY;  Service: Endoscopy;  Laterality: N/A;  .  COLONOSCOPY WITH PROPOFOL N/A 06/14/2017   Procedure: COLONOSCOPY WITH PROPOFOL;  Surgeon: Manya Silvas, MD;  Location: [facility] ENDOSCOPY;  Service: Endoscopy;  Laterality: N/A;  . LOBECTOMY  01/2007   Right lung, upper lobe right side removed  . TOTAL ABDOMINAL HYSTERECTOMY     abnormal bleeding  . TUBAL LIGATION  1980    Prior to Admission medications   Medication Sig Start Date End Date Taking? Authorizing Provider  ALPRAZolam Duanne Moron) 0.5 MG tablet TAKE 1/2 TABLET BY MOUTH DAILY AS NEEDEDFOR ANXIETY 03/31/18   Crecencio Mc, MD  aspirin EC 81 MG tablet Take 81 mg by mouth daily.    [provider]  carbidopa-levodopa (SINEMET IR) 25-100 MG tablet Take 2 tablets by mouth 3 (three) times daily.  07/30/17   [provider]  citalopram (CELEXA) 10 MG tablet TAKE 1 TABLET BY MOUTH DAILY 05/29/18   Einar Pheasant, MD  fluticasone-salmeterol (ADVAIR HFA) [phone] MCG/ACT inhaler Inhale 2 puffs into the lungs 2 (two) times daily. 12/03/17   Wilhelmina Mcardle, MD  hydrochlorothiazide (HYDRODIURIL) 12.5 MG tablet Take 1 tablet (12.5 mg total) by mouth daily. 03/04/18   Jodelle Green, FNP  Multiple Vitamin (MULTIVITAMIN) tablet Take 1 tablet by mouth daily.    [provider]  omeprazole (PRILOSEC) 20 MG capsule TAKE 1 CAPSULE BY MOUTH EVERY DAY 03/31/18   Einar Pheasant, MD  polyethylene glycol powder (GLYCOLAX/MIRALAX) powder  06/03/17   [provider]  potassium chloride SA (K-DUR,KLOR-CON) 20 MEQ tablet TAKE 1 TABLET BY MOUTH TWICE A DAY 04/29/18   Einar Pheasant, MD  traZODone (DESYREL) 50 MG tablet TAKE 2 TABLETS BY MOUTH AT BEDTIME 04/29/18   Einar Pheasant, MD  VENTOLIN HFA 108 (90 Base) MCG/ACT inhaler INHALE 2 PUFFS BY MOUTH INTO THE LUNGS EVERY 6 HOURS AS NEEDED FOR WHEEZING 04/29/18   Einar Pheasant, MD  vitamin B-12 (CYANOCOBALAMIN) 100 MCG tablet Take 100 mcg by mouth daily.    [provider]    Allergies Macrobid [nitrofurantoin  macrocrystal]; Antihistamines, diphenhydramine-type; Aspirin; Ceftin [cefuroxime axetil]; Celecoxib; Cymbalta [duloxetine hcl]; Diphenhydramine; Escitalopram; Lexapro [escitalopram oxalate]; Metronidazole; Nitrofurantoin; Zoloft [sertraline hcl]; and Ciprofloxacin  Family History  Problem Relation Age of Onset  . Coronary artery disease Mother        died @ 44 of MI.  . Mental illness Mother   . Diabetes Mother   . Heart attack Father 63       MI  . Cancer Father        lung - died in his mid-70's  . Hypertension Father   . Diabetes Father   . Sudden death Brother   . Mental illness Brother   . Heart attack Brother        MI @ 80, died @ age 52.  . Breast cancer Neg Hx     Social History Social History   Tobacco Use  . Smoking status: Passive Smoke Exposure - Never Smoker  . Smokeless tobacco: Never Used  . Tobacco comment: husband and sons smoked in her home.  Substance  Use Topics  . Alcohol use: Yes    Alcohol/week: 1.0 standard drinks    Types: 1 Glasses of wine per week    Comment: rare wine cooler.  . Drug use: No    Review of Systems  Constitutional: No fever. Eyes: No visual changes. ENT: No sore throat. Cardiovascular: Denies chest pain. Respiratory: Denies shortness of breath. Gastrointestinal: No vomiting. Genitourinary: Negative for frequency.  Musculoskeletal: Negative for back pain. Skin: Negative for rash. Neurological: Positive for headache.   ____________________________________________   PHYSICAL EXAM:  VITAL SIGNS: ED Triage Vitals  Enc Vitals Group     BP 06/21/18 2031 136/64     Pulse Rate 06/21/18 2031 69     Resp 06/21/18 2031 18     Temp 06/21/18 2031 97.7 F (36.5 C)     Temp Source 06/21/18 2031 Oral     SpO2 06/21/18 2031 98 %     Weight 06/21/18 2033 197 lb (89.4 kg)     Height 06/21/18 2033 5\' 3"  (1.6 m)     Head Circumference --      Peak Flow --      Pain Score 06/21/18 2031 9     Pain Loc --      Pain Edu? --       Excl. in [place]? --     Constitutional: Alert and oriented.  Anxious appearing but in no acute distress. Eyes: Conjunctivae are normal.  EOMI.  PERRLA. Head: Atraumatic. Nose: No congestion/rhinnorhea. Mouth/Throat: Mucous membranes are moist.   Neck: Normal range of motion.  Cardiovascular: Normal rate, regular rhythm. Grossly normal heart sounds.  Good peripheral circulation. Respiratory: Normal respiratory effort.  No retractions. Lungs CTAB. Gastrointestinal: Soft and nontender. No distention.  Genitourinary: No CVA tenderness. Musculoskeletal: No lower extremity edema.  Extremities warm and well perfused.  Neurologic:  Normal speech and language.  Motor and sensory intact in all extremities.  Normal coordination with no ataxia.  No gross focal neurologic deficits are appreciated.  Skin:  Skin is warm and dry. No rash noted. Psychiatric: Anxious appearing.  Speech and behavior are normal.  ____________________________________________   LABS (all labs ordered are listed, but only abnormal results are displayed)  Labs Reviewed  BASIC METABOLIC PANEL - Abnormal; Notable for the following components:      Result Value   Sodium 134 (*)    Glucose, Bld 119 (*)    Creatinine, Ser 1.11 (*)    Calcium 8.8 (*)    GFR calc non Af Amer 51 (*)    GFR calc Af Amer 59 (*)    All other components within normal limits  CBC WITH DIFFERENTIAL/PLATELET - Abnormal; Notable for the following components:   WBC 11.2 (*)    Hemoglobin 11.2 (*)    HCT 35.2 (*)    All other components within normal limits  TROPONIN I   ____________________________________________  EKG  ED ECG REPORT I, Arta Silence, the attending physician, personally viewed and interpreted this ECG.  Date: 06/22/2018 EKG Time: 0116 Rate: 62 Rhythm: normal sinus rhythm QRS Axis: normal Intervals: Nonspecific IVCD ST/T Wave abnormalities: Repolarization abnormality with nonspecific ST segment abnormalities inferior and  lateral Narrative Interpretation: Nonspecific ST abnormalities when compared to EKG of 04/04/2018  ____________________________________________  RADIOLOGY  CT head: No ICH or other acute abnormality  ____________________________________________   PROCEDURES  Procedure(s) performed: No  Procedures  Critical Care performed: No ____________________________________________   INITIAL IMPRESSION / ASSESSMENT AND PLAN / ED COURSE  Pertinent labs &  imaging results that were available during my care of the patient were reviewed by me and considered in my medical decision making (see chart for details).  68 year old female with PMH as noted above presents with elevated blood pressure as well as a frontal headache.  The patient states that the symptoms were not relieved at home by Xanax and aspirin which is what she usually takes when this happens.  She is concerned because she is only on a very low-dose of an antihypertensive because her Parkinson's medication sometimes makes her blood pressure drop.  I reviewed the past medical records in Epic; the patient was seen in the ED with a very similar presentation in October of last year including the headache, although today she states she does not remember having the headache at that time and thinks that that episode was different.  When I first came to evaluate the patient she was actively having what she described as a panic attack and appeared extremely anxious.  Her blood pressure was initially elevated but her other vital signs have been normal.  She asked if she could take her own Xanax and I told her she could.  On reassessment after this she appeared much more calm and her blood pressure had come down to the 361W systolic.  The remainder of her exam is unremarkable.  Neuro exam is nonfocal.  Her EKG is slightly abnormal with nonspecific ST flattening when compared to her EKG from October of last year.  Overall I suspect most likely acute  on chronic hypertension related to anxiety; I have a low suspicion for hypertensive emergency or any endorgan dysfunction at this time.  We will obtain basic labs, troponin given the slightly abnormal EKG, CT head, and reassess.  I anticipate that if the patient's blood pressure is improved and her work-up is negative that she will be able to go home.   ----------------------------------------- 4:59 AM on 06/22/2018 -----------------------------------------  The patient's blood pressure has significantly improved.  CT head and troponin are both negative.  She is asymptomatic at this time.  She is stable for discharge home.  I counseled her on the results of the work-up and the plan of care.  She and her family members expressed understanding.  I instructed her to keep a log of her blood pressures over the next several days and to call her primary care doctor this week to determine if any adjustments to her regimen need to be made, however given that her blood pressure seems to be fairly labile and she is on other medications which can affected I do not feel it is worthwhile to try to change her regimen from the ED.  Return precautions given, and the patient and family members expressed understanding.  ____________________________________________   FINAL CLINICAL IMPRESSION(S) / ED DIAGNOSES  Final diagnoses:  Hypertension, unspecified type      NEW MEDICATIONS STARTED DURING THIS VISIT:  New Prescriptions   No medications on file     Note:  This document was prepared using Dragon voice recognition software and may include unintentional dictation errors.    Arta Silence, MD 06/22/18 0500

## 2018-06-22 NOTE — ED Notes (Signed)
Patient transported to CT 

## 2018-06-22 NOTE — ED Notes (Signed)
ED Provider at bedside. 

## 2018-06-27 ENCOUNTER — Other Ambulatory Visit: Payer: Self-pay | Admitting: Internal Medicine

## 2018-06-27 ENCOUNTER — Other Ambulatory Visit: Payer: Self-pay | Admitting: Family Medicine

## 2018-06-27 DIAGNOSIS — I10 Essential (primary) hypertension: Secondary | ICD-10-CM

## 2018-07-03 ENCOUNTER — Encounter: Payer: Self-pay | Admitting: Family Medicine

## 2018-07-03 ENCOUNTER — Ambulatory Visit (INDEPENDENT_AMBULATORY_CARE_PROVIDER_SITE_OTHER): Payer: Medicare HMO | Admitting: Family Medicine

## 2018-07-03 VITALS — BP 140/82 | HR 77 | Temp 98.4°F | Resp 20 | Ht 64.0 in | Wt 196.2 lb

## 2018-07-03 DIAGNOSIS — J029 Acute pharyngitis, unspecified: Secondary | ICD-10-CM

## 2018-07-03 DIAGNOSIS — J0110 Acute frontal sinusitis, unspecified: Secondary | ICD-10-CM | POA: Diagnosis not present

## 2018-07-03 LAB — POC INFLUENZA A&B (BINAX/QUICKVUE)
Influenza A, POC: NEGATIVE
Influenza B, POC: NEGATIVE

## 2018-07-03 LAB — POCT RAPID STREP A (OFFICE): RAPID STREP A SCREEN: NEGATIVE

## 2018-07-03 MED ORDER — FLUTICASONE PROPIONATE 50 MCG/ACT NA SUSP: 2.0000 | Freq: Every day | NASAL | 6 refills | Status: DC

## 2018-07-03 MED ORDER — AMOXICILLIN-POT CLAVULANATE 875-125 MG PO TABS: 1.0000 | ORAL_TABLET | Freq: Two times a day (BID) | ORAL | 0 refills | Status: DC

## 2018-07-03 NOTE — Progress Notes (Signed)
Subjective:    Patient ID: Victoria Hanson, female    DOB: 1951/02/25, 68 y.o.   MRN: [id number]  HPI   Patient presents to clinic complaining of sinus congestion, thick yellow mucus and drainage from nose, sore throat, cough that has been present for 2 weeks.  She is tried over-the-counter cold and flu medicines without much effect in helping symptoms.  Denies fever or chills.  Denies nausea/vomiting or diarrhea.  Denies shortness of breath or wheezing.  Denies chest pain.  Patient Active Problem List   Diagnosis Date Noted  . B12 deficiency 03/11/2018  . Breast pain, right 01/25/2018  . Discharge from right nipple 11/10/2017  . Goals of care, counseling/discussion 10/15/2017  . Parkinson's disease ([place]) 07/31/2017  . Chronic obstructive pulmonary disease ([place]) 07/31/2017  . Elevated liver function tests 08/25/2016  . Carotid stenosis 07/19/2016  . Tremor 06/01/2016  . Osteoporosis 04/08/2016  . Foot pain, bilateral 07/24/2015  . Hyperlipidemia 06/21/2015  . Other fatigue 06/21/2015  . BMI 33.0-33.9,adult 06/21/2015  . Abnormal mammogram 06/19/2015  . Urinary system disease 03/28/2015  . Hyperglycemia 03/20/2015  . Swelling of lower extremity 11/20/2014  . Health care maintenance 07/17/2014  . Hypokalemia 04/05/2013  . TIA (transient ischemic attack) 02/09/2013  . Temporary cerebral vascular dysfunction 02/09/2013  . Anxiety, mild 03/13/2012  . Depression, recurrent ([place]) 03/13/2012  . History of colon cancer 03/13/2012  . Hypertension 03/13/2012  . CKD (chronic kidney disease), stage III ([place]) 03/13/2012  . GERD (gastroesophageal reflux disease) 03/13/2012  . Acid reflux 03/13/2012  . History of malignant neoplasm metastatic to lung 03/13/2012  . Major depressive disorder with single episode 03/13/2012  . Hypercholesterolemia 02/02/2010  . Pure hypercholesterolemia 02/02/2010   Social History   Tobacco Use  . Smoking status: Passive Smoke Exposure - Never Smoker    . Smokeless tobacco: Never Used  . Tobacco comment: husband and sons smoked in her home.  Substance Use Topics  . Alcohol use: Yes    Alcohol/week: 1.0 standard drinks    Types: 1 Glasses of wine per week    Comment: rare wine cooler.   Review of Systems   Constitutional: Negative for chills, fatigue and fever.  HENT: +congestion, ear pain, sinus pain and sore throat.   Eyes: Negative.   Respiratory: Negative for cough, shortness of breath and wheezing.   Cardiovascular: Negative for chest pain, palpitations and leg swelling.  Gastrointestinal: Negative for abdominal pain, diarrhea, nausea and vomiting.  Genitourinary: Negative for dysuria, frequency and urgency.  Musculoskeletal: Negative for arthralgias and myalgias.  Skin: Negative for color change, pallor and rash.  Neurological: Negative for syncope, light-headedness and headaches.  Psychiatric/Behavioral: The patient is not nervous/anxious.       Objective:   Physical Exam Vitals signs and nursing note reviewed.  Constitutional:      General: She is not in acute distress.    Appearance: She is not toxic-appearing or diaphoretic.  HENT:     Head: Normocephalic and atraumatic.     Nose: Congestion and rhinorrhea present.     Right Sinus: Maxillary sinus tenderness and frontal sinus tenderness present.     Left Sinus: Maxillary sinus tenderness and frontal sinus tenderness present.     Comments: +thick yellow nasal drainage. +post nasal drip. +fullness bilateral TMs Eyes:     General: No scleral icterus.    Extraocular Movements: Extraocular movements intact.     Conjunctiva/sclera: Conjunctivae normal.  Neck:     Musculoskeletal: Neck supple. No  neck rigidity.  Cardiovascular:     Rate and Rhythm: Normal rate and regular rhythm.  Pulmonary:     Effort: Pulmonary effort is normal. No respiratory distress.     Breath sounds: Normal breath sounds. No wheezing, rhonchi or rales.  Skin:    General: Skin is warm and  dry.     Coloration: Skin is not pale.  Neurological:     Mental Status: She is alert and oriented to person, place, and time.  Psychiatric:        Mood and Affect: Mood normal.        Behavior: Behavior normal.     Vitals:   07/03/18 1003  BP: 140/82  Pulse: 77  Resp: 20  Temp: 98.4 F (36.9 C)  SpO2: 98%      Assessment & Plan:   Acute sinusitis -- patient will take Augmentin twice daily for 10 days to treat sinus infection.  Advised to use Flonase nasal spray once per day to help reduce nasal congestion.  Also suggested she can do saline nasal rinses to help thin out congestion as well.  Creatinine clearance calculated is 53 mL/min.  Patient will rest, increase fluid intake and do good handwashing.  Keep regularly scheduled follow-up PCP as planned.  Return to clinic sooner if any issues arise.

## 2018-07-05 LAB — CULTURE, UPPER RESPIRATORY
MICRO NUMBER:: 95295
SPECIMEN QUALITY:: ADEQUATE

## 2018-07-08 ENCOUNTER — Ambulatory Visit: Payer: Medicare HMO | Admitting: Pulmonary Disease

## 2018-07-16 ENCOUNTER — Encounter: Payer: Self-pay | Admitting: Internal Medicine

## 2018-07-24 ENCOUNTER — Telehealth: Payer: Self-pay

## 2018-07-24 NOTE — Telephone Encounter (Signed)
Copied from [place] [id number]. Topic: General - Other >> Jul 24, 2018 10:18 AM Carolyn Stare wrote:  Pt had to reschedule her CPE and that want be till June 2020 and she is asking if she need to see the doctor before then

## 2018-07-24 NOTE — Telephone Encounter (Signed)
I called the patient to schedule a sooner appointment , but she was asleep. The person that answered the phone stated she would have her to call back.

## 2018-07-24 NOTE — Telephone Encounter (Signed)
Pt called and stated that she missed a call regarding message below. Please advise.

## 2018-07-25 ENCOUNTER — Telehealth: Payer: Self-pay

## 2018-07-25 NOTE — Telephone Encounter (Signed)
Left message to call the office to schedule.

## 2018-07-25 NOTE — Telephone Encounter (Signed)
Copied from [place] [id number]. Topic: General - Other >> Jul 24, 2018 10:18 AM Victoria Hanson wrote:  Pt had to reschedule her CPE and that want be till June 2020 and she is asking if she need to see the doctor before then >> Jul 25, 2018  1:03 PM [name], [name] wrote: Pt returned call to office. Pt requests call back. Pt stated she has to go back to the hospital with her son so if she does not answer to leave a detailed message regarding the appt. Cb# [phone]

## 2018-07-25 NOTE — Telephone Encounter (Signed)
Pt needing to be rescheduled for cpe. Her original appt was 3/11

## 2018-07-28 ENCOUNTER — Other Ambulatory Visit: Payer: Self-pay | Admitting: Internal Medicine

## 2018-07-28 NOTE — Telephone Encounter (Signed)
Can we move her appt up from June

## 2018-07-28 NOTE — Telephone Encounter (Signed)
Pt called again to check for earlier appt. She said that she cannot push back all the way until June. Pt notes ok to change appt and leave her detailed msg if she cannot answer. Please advise.

## 2018-07-29 ENCOUNTER — Other Ambulatory Visit: Payer: Self-pay

## 2018-07-29 DIAGNOSIS — F339 Major depressive disorder, recurrent, unspecified: Secondary | ICD-10-CM

## 2018-07-29 DIAGNOSIS — I10 Essential (primary) hypertension: Secondary | ICD-10-CM

## 2018-07-29 MED ORDER — TRAZODONE HCL 50 MG PO TABS: 100.0000 mg | ORAL_TABLET | Freq: Every day | ORAL | 1 refills | Status: DC

## 2018-07-29 MED ORDER — CITALOPRAM HYDROBROMIDE 10 MG PO TABS: 10.0000 mg | ORAL_TABLET | Freq: Every day | ORAL | 1 refills | Status: DC

## 2018-07-29 MED ORDER — POTASSIUM CHLORIDE CRYS ER 20 MEQ PO TBCR: 20.0000 meq | EXTENDED_RELEASE_TABLET | Freq: Two times a day (BID) | ORAL | 1 refills | Status: DC

## 2018-07-29 MED ORDER — OMEPRAZOLE 20 MG PO CPDR: 20.0000 mg | DELAYED_RELEASE_CAPSULE | Freq: Every day | ORAL | 1 refills | Status: DC

## 2018-07-29 MED ORDER — HYDROCHLOROTHIAZIDE 12.5 MG PO TABS: 12.5000 mg | ORAL_TABLET | Freq: Every day | ORAL | 1 refills | Status: DC

## 2018-07-29 NOTE — Telephone Encounter (Signed)
The patient has been rescheduled. She is aware.

## 2018-07-29 NOTE — Progress Notes (Unsigned)
Received fax from [facility] requesting that we send in all of patients prescriptions for 90 days at a time due to insurance preference. Patient has agreed to this. I have sent in everything except for her HCTZ. It looks like this has been filled by Ander Purpura twice. Are you okay with taking over this medication?

## 2018-07-29 NOTE — Telephone Encounter (Signed)
Pt states that Lorriane Shire called her and she is returning [employer].

## 2018-07-29 NOTE — Progress Notes (Signed)
[place] for hctz #90 with no refills.

## 2018-07-29 NOTE — Progress Notes (Signed)
rx sent in # 38 with 1. OK'd per Dr. Nicki Reaper

## 2018-07-29 NOTE — Telephone Encounter (Signed)
Refilled: 03/31/2018 Last OV: 07/03/2018 Next OV: 11/19/2018

## 2018-07-30 ENCOUNTER — Other Ambulatory Visit: Payer: Self-pay | Admitting: Internal Medicine

## 2018-07-30 MED ORDER — VENTOLIN HFA 108 (90 BASE) MCG/ACT IN AERS: INHALATION_SPRAY | RESPIRATORY_TRACT | 1 refills | Status: DC

## 2018-07-30 NOTE — Telephone Encounter (Signed)
Copied from [place] [id number]. Topic: Quick Communication - Rx Refill/Question >> Jul 30, 2018 10:15 AM Judyann Munson wrote: Medication: VENTOLIN HFA 108 (90 Base) MCG/ACT inhaler  Has the patient contacted their pharmacy? Yes  Preferred Pharmacy (with phone number or street name): [place], [place] - [place] [phone] (Phone) [phone] (Fax)    Agent: Please be advised that RX refills may take up to 3 business days. We ask that you follow-up with your pharmacy.

## 2018-07-30 NOTE — Telephone Encounter (Signed)
rx ok'd for xanax #30 with no refills.

## 2018-07-30 NOTE — Telephone Encounter (Signed)
Requested Prescriptions  Pending Prescriptions Disp Refills  . VENTOLIN HFA 108 (90 Base) MCG/ACT inhaler 18 g 1    Sig: INHALE 2 PUFFS BY MOUTH INTO THE LUNGS EVERY 6 HOURS AS NEEDED FOR WHEEZING     Pulmonology:  Beta Agonists Failed - 07/30/2018 10:17 AM      Failed - One inhaler should last at least one month. If the patient is requesting refills earlier, contact the patient to check for uncontrolled symptoms.      Passed - Valid encounter within last 12 months    Recent Outpatient Visits          3 weeks ago Acute frontal sinusitis, recurrence not specified   Brent Guse, Jacquelynn Cree, FNP   2 months ago Essential hypertension   [place]ami[place]o, MD   3 months ago Urinary tract infection without hematuria, site unspecified   Sebring Guse, Jacquelynn Cree, FNP   4 months ago Essential hypertension   Bertie Guse, Jacquelynn Cree, FNP   6 months ago Breast cancer screening   [place], MD      Future Appointments            In 1 month Einar Pheasant, MD [place], [place]

## 2018-07-30 NOTE — Telephone Encounter (Signed)
Pt says she does not see psychiatry. She does need a refill. Takes prn. She has an appt with you on 4/7

## 2018-07-30 NOTE — Telephone Encounter (Signed)
Please check with pt and see if she is seeing psychiatry.  I do not mind refilling medication, but need to confirm who she is seeing.  (per neurology note, question if seeing psychiatry).

## 2018-08-20 ENCOUNTER — Encounter: Payer: Medicare HMO | Admitting: Internal Medicine

## 2018-08-27 ENCOUNTER — Telehealth: Payer: Self-pay

## 2018-08-27 NOTE — Telephone Encounter (Signed)
Attempted to call patient for COVID-19 screening. No answer.

## 2018-08-28 ENCOUNTER — Ambulatory Visit (INDEPENDENT_AMBULATORY_CARE_PROVIDER_SITE_OTHER): Payer: Medicare HMO | Admitting: Pulmonary Disease

## 2018-08-28 ENCOUNTER — Other Ambulatory Visit: Payer: Self-pay

## 2018-08-28 ENCOUNTER — Encounter: Payer: Self-pay | Admitting: Pulmonary Disease

## 2018-08-28 VITALS — BP 140/80 | HR 70 | Ht 64.0 in | Wt 203.0 lb

## 2018-08-28 DIAGNOSIS — J449 Chronic obstructive pulmonary disease, unspecified: Secondary | ICD-10-CM

## 2018-08-28 DIAGNOSIS — R0602 Shortness of breath: Secondary | ICD-10-CM | POA: Diagnosis not present

## 2018-08-28 MED ORDER — FLUTICASONE-SALMETEROL 115-21 MCG/ACT IN AERO: 2.0000 | INHALATION_SPRAY | Freq: Two times a day (BID) | RESPIRATORY_TRACT | 12 refills | Status: DC

## 2018-08-28 NOTE — Progress Notes (Signed)
PULMONARY OFFICE FOLLOW UP NOTE  Requesting MD/Service: Eliezer Lofts, MD Date of initial consultation: 07/06/16 Reason for consultation: COPD  PT PROFILE: 68 y.o.F never smoker but with significant second hand smoke exposure and prior employment in [employer]. S/P RUL resection in 2008 for lung cancer. Carries diagnosis of COPD. Referred for eval of class II-III dyspnea.  DATA: Echocardiogram 06/21/15: normal LVEF, mildly dilated LA. RVSP est 37 mmHg CT chest 03/10/16: No evidence of pulmonary embolism. No acute findings in the thorax to account for the patient's symptoms. Status post right upper lobectomy PFT 08/07/16: FVC:  2.43L (87 %pred), FEV1:  1.61L (79 %pred), FEV1/FVC: 66% , TLC: 4.68 L (101 %pred), DLCO 61 %pred, DLCO/VA 110% PFT 11/26/17: FVC: 2.28 L (83 %pred), FEV1: 1.65 L (75 %pred), FEV1/FVC: 72%, TLC: 4.35 L (94 %pred), DLCO 59 %pred, DLCO/VA 105%  INTERVAL:  Last visit 12/03/2017.  No major events since that time.  SUBJ:  This is a belated follow-up visit.  She missed her last appointment and then scheduled this 1 because of symptoms of persistent dyspnea.  In her words she is "a lot worse".  She indicates that she is not really using Advair as previously prescribed.  She uses it once or twice a week when she feels like she needs it.  She is using Ventolin inhaler 3-4 times per day because this gives her more rapid relief of dyspnea.  She also notes that Xanax helps exertional dyspnea significantly.  She has little day-to-day variation in her symptoms.  She denies chest pain, fever, purulent sputum, hemoptysis, lower extremity edema, calf tenderness.   Vitals:   08/28/18 1509 08/28/18 1514  BP:  140/80  Pulse:  70  SpO2:  96%  Weight: 203 lb (92.1 kg)   Height: 5\' 4"  (1.626 m)   Room air   EXAM:  Gen: NAD HEENT: NCAT, sclerae white Neck: No JVD Lungs: breath sounds full, no wheezes or other adventitious sounds Cardiovascular: RRR, no murmurs Abdomen: Soft,  nontender, normal BS Ext: without clubbing, cyanosis, edema Neuro: grossly intact Skin: Limited exam, no lesions noted   DATA:   BMP Latest Ref Rng & Units 06/22/2018 05/02/2018 04/04/2018  Glucose 70 - 99 mg/dL 119(H) 99 122(H)  BUN 8 - 23 mg/dL 16 18 18   Creatinine 0.44 - 1.00 mg/dL 1.11(H) 1.21(H) 1.46(H)  Sodium 135 - 145 mmol/L 134(L) 137 139  Potassium 3.5 - 5.1 mmol/L 4.0 3.9 4.0  Chloride 98 - 111 mmol/L 100 102 105  CO2 22 - 32 mmol/L 24 29 25   Calcium 8.9 - 10.3 mg/dL 8.8(L) 9.5 9.2    CBC Latest Ref Rng & Units 06/22/2018 04/04/2018 03/10/2018  WBC 4.0 - 10.5 K/uL 11.2(H) 8.1 8.3  Hemoglobin 12.0 - 15.0 g/dL 11.2(L) 11.5(L) 12.3  Hematocrit 36.0 - 46.0 % 35.2(L) 35.9(L) 36.5  Platelets 150 - 400 K/uL 238 246 248    CXR 04/04/18: No acute findings  IMPRESSION:     ICD-10-CM   1. COPD, mild ([place]) J44.9 Pulmonary Function Test [facility] Only  2. Suspect anxiety is contributing to shortness of breath R06.02    Unfortunately, it is difficult to discern whether Advair provides her any benefit since she is not using it consistently.  I confirmed that she has adequate inhaler technique.  We discussed in detail the difference between a controller medication and a rescue medication.   PLAN:  Resume Advair HFA.  I have ordered for an increase to the 115-21 strength.  2 actuations twice  a day.  Rinse mouth after use  Continue albuterol inhaler as needed for increased shortness of breath, wheezing, chest tightness, cough  Follow-up in 2-3 months with repeat PFTs prior to that visit   Merton Border, MD PCCM service Mobile [phone] Pager [phone] 08/28/2018 4:13 PM

## 2018-08-28 NOTE — Patient Instructions (Addendum)
Increase Advair HFA to 115-21 strength.  Continue to use 2 puffs twice a day EVERY DAY!!!  Make sure you rinse her mouth after use  Continue albuterol inhaler as needed for increased shortness of breath, wheezing, chest tightness, cough  Follow-up in 2-3 months with repeat PFTs (lung function test) prior to that visit

## 2018-09-08 ENCOUNTER — Other Ambulatory Visit: Payer: Medicare HMO

## 2018-09-08 ENCOUNTER — Ambulatory Visit (INDEPENDENT_AMBULATORY_CARE_PROVIDER_SITE_OTHER): Payer: Medicare HMO | Admitting: Family Medicine

## 2018-09-08 ENCOUNTER — Ambulatory Visit: Payer: Medicare HMO | Admitting: Family Medicine

## 2018-09-08 ENCOUNTER — Inpatient Hospital Stay: Payer: Medicare HMO | Admitting: Oncology

## 2018-09-08 ENCOUNTER — Ambulatory Visit: Payer: Medicare HMO | Admitting: Hematology and Oncology

## 2018-09-08 ENCOUNTER — Encounter: Payer: Self-pay | Admitting: Family Medicine

## 2018-09-08 ENCOUNTER — Inpatient Hospital Stay: Payer: Medicare HMO

## 2018-09-08 ENCOUNTER — Other Ambulatory Visit: Payer: Self-pay

## 2018-09-08 VITALS — BP 120/72 | HR 78 | Temp 98.1°F | Resp 18 | Ht 63.0 in | Wt 201.2 lb

## 2018-09-08 DIAGNOSIS — N390 Urinary tract infection, site not specified: Secondary | ICD-10-CM | POA: Diagnosis not present

## 2018-09-08 DIAGNOSIS — R350 Frequency of micturition: Secondary | ICD-10-CM

## 2018-09-08 DIAGNOSIS — R319 Hematuria, unspecified: Secondary | ICD-10-CM

## 2018-09-08 LAB — POCT URINALYSIS DIPSTICK
Bilirubin, UA: NEGATIVE
Glucose, UA: NEGATIVE
Ketones, UA: NEGATIVE
Nitrite, UA: NEGATIVE
Protein, UA: POSITIVE — AB
Spec Grav, UA: 1.015 (ref 1.010–1.025)
Urobilinogen, UA: 0.2 E.U./dL
pH, UA: 7 (ref 5.0–8.0)

## 2018-09-08 MED ORDER — SULFAMETHOXAZOLE-TRIMETHOPRIM 800-160 MG PO TABS: 1.0000 | ORAL_TABLET | Freq: Two times a day (BID) | ORAL | 0 refills | Status: DC

## 2018-09-08 MED ORDER — PHENAZOPYRIDINE HCL 100 MG PO TABS: 100.0000 mg | ORAL_TABLET | Freq: Three times a day (TID) | ORAL | 0 refills | Status: DC | PRN

## 2018-09-08 NOTE — Patient Instructions (Signed)
Urinary Tract Infection, Adult A urinary tract infection (UTI) is an infection of any part of the urinary tract. The urinary tract includes:  The kidneys.  The ureters.  The bladder.  The urethra. These organs make, store, and get rid of pee (urine) in the body. What are the causes? This is caused by germs (bacteria) in your genital area. These germs grow and cause swelling (inflammation) of your urinary tract. What increases the risk? You are more likely to develop this condition if:  You have a small, thin tube (catheter) to drain pee.  You cannot control when you pee or poop (incontinence).  You are female, and: ? You use these methods to prevent pregnancy: ? A medicine that kills sperm (spermicide). ? A device that blocks sperm (diaphragm). ? You have low levels of a female hormone (estrogen). ? You are pregnant.  You have genes that add to your risk.  You are sexually active.  You take antibiotic medicines.  You have trouble peeing because of: ? A prostate that is bigger than normal, if you are female. ? A blockage in the part of your body that drains pee from the bladder (urethra). ? A kidney stone. ? A nerve condition that affects your bladder (neurogenic bladder). ? Not getting enough to drink. ? Not peeing often enough.  You have other conditions, such as: ? Diabetes. ? A weak disease-fighting system (immune system). ? Sickle cell disease. ? Gout. ? Injury of the spine. What are the signs or symptoms? Symptoms of this condition include:  Needing to pee right away (urgently).  Peeing often.  Peeing small amounts often.  Pain or burning when peeing.  Blood in the pee.  Pee that smells bad or not like normal.  Trouble peeing.  Pee that is cloudy.  Fluid coming from the vagina, if you are female.  Pain in the belly or lower back. Other symptoms include:  Throwing up (vomiting).  No urge to eat.  Feeling mixed up (confused).  Being tired  and grouchy (irritable).  A fever.  Watery poop (diarrhea). How is this treated? This condition may be treated with:  Antibiotic medicine.  Other medicines.  Drinking enough water. Follow these instructions at home:  Medicines  Take over-the-counter and prescription medicines only as told by your doctor.  If you were prescribed an antibiotic medicine, take it as told by your doctor. Do not stop taking it even if you start to feel better. General instructions  Make sure you: ? Pee until your bladder is empty. ? Do not hold pee for a long time. ? Empty your bladder after sex. ? Wipe from front to back after pooping if you are a female. Use each tissue one time when you wipe.  Drink enough fluid to keep your pee pale yellow.  Keep all follow-up visits as told by your doctor. This is important. Contact a doctor if:  You do not get better after 1-2 days.  Your symptoms go away and then come back. Get help right away if:  You have very bad back pain.  You have very bad pain in your lower belly.  You have a fever.  You are sick to your stomach (nauseous).  You are throwing up. Summary  A urinary tract infection (UTI) is an infection of any part of the urinary tract.  This condition is caused by germs in your genital area.  There are many risk factors for a UTI. These include having a small, thin   tube to drain pee and not being able to control when you pee or poop.  Treatment includes antibiotic medicines for germs.  Drink enough fluid to keep your pee pale yellow. This information is not intended to replace advice given to you by your health care provider. Make sure you discuss any questions you have with your health care provider. Document Released: 11/14/2007 Document Revised: 12/05/2017 Document Reviewed: 12/05/2017 Elsevier Interactive Patient Education  2019 Elsevier Inc.  

## 2018-09-08 NOTE — Progress Notes (Signed)
Subjective:    Patient ID: Victoria Hanson, female    DOB: 06-Jan-1951, 68 y.o.   MRN: [id number]  HPI  Patient presents to clinic due to increased urinary frequency, urinary pressure and burning with urination that has increasingly gotten worse over the past week.  She has been trying to increase water intake, but symptoms do not seem to be improving.  Denies any abdominal pain, denies nausea vomiting or diarrhea.  Denies fever or chills.  Patient Active Problem List   Diagnosis Date Noted  . B12 deficiency 03/11/2018  . Breast pain, right 01/25/2018  . Discharge from right nipple 11/10/2017  . Goals of care, counseling/discussion 10/15/2017  . Parkinson's disease ([place]) 07/31/2017  . Chronic obstructive pulmonary disease ([place]) 07/31/2017  . Elevated liver function tests 08/25/2016  . Carotid stenosis 07/19/2016  . Tremor 06/01/2016  . Osteoporosis 04/08/2016  . Foot pain, bilateral 07/24/2015  . Hyperlipidemia 06/21/2015  . Other fatigue 06/21/2015  . BMI 33.0-33.9,adult 06/21/2015  . Abnormal mammogram 06/19/2015  . Urinary system disease 03/28/2015  . Hyperglycemia 03/20/2015  . Swelling of lower extremity 11/20/2014  . Health care maintenance 07/17/2014  . Hypokalemia 04/05/2013  . TIA (transient ischemic attack) 02/09/2013  . Temporary cerebral vascular dysfunction 02/09/2013  . Anxiety, mild 03/13/2012  . Depression, recurrent ([place]) 03/13/2012  . History of colon cancer 03/13/2012  . Hypertension 03/13/2012  . CKD (chronic kidney disease), stage III ([place]) 03/13/2012  . GERD (gastroesophageal reflux disease) 03/13/2012  . Acid reflux 03/13/2012  . History of malignant neoplasm metastatic to lung 03/13/2012  . Major depressive disorder with single episode 03/13/2012  . Hypercholesterolemia 02/02/2010  . Pure hypercholesterolemia 02/02/2010   Social History   Tobacco Use  . Smoking status: Passive Smoke Exposure - Never Smoker  . Smokeless tobacco: Never Used  .  Tobacco comment: husband and sons smoked in her home.  Substance Use Topics  . Alcohol use: Yes    Alcohol/week: 1.0 standard drinks    Types: 1 Glasses of wine per week    Comment: rare wine cooler.   Review of Systems  Constitutional: Negative for chills, fatigue and fever.  HENT: Negative for congestion, ear pain, sinus pain and sore throat.   Eyes: Negative.   Respiratory: Negative for cough, shortness of breath and wheezing.   Cardiovascular: Negative for chest pain, palpitations and leg swelling.  Gastrointestinal: Negative for abdominal pain, diarrhea, nausea and vomiting.  Genitourinary: +dysuria, frequency and urgency.  Musculoskeletal: Negative for arthralgias and myalgias.  Skin: Negative for color change, pallor and rash.  Neurological: Negative for syncope, light-headedness and headaches.  Psychiatric/Behavioral: The patient is not nervous/anxious.       Objective:   Physical Exam Vitals signs and nursing note reviewed.  Constitutional:      General: She is not in acute distress.    Appearance: She is well-developed. She is not toxic-appearing.  HENT:     Head: Normocephalic and atraumatic.  Eyes:     General: No scleral icterus.    Extraocular Movements: Extraocular movements intact.     Conjunctiva/sclera: Conjunctivae normal.  Neck:     Musculoskeletal: Neck supple.     Trachea: No tracheal deviation.  Cardiovascular:     Rate and Rhythm: Normal rate and regular rhythm.     Heart sounds: Normal heart sounds.  Pulmonary:     Effort: Pulmonary effort is normal. No respiratory distress.     Breath sounds: Normal breath sounds.  Abdominal:  General: Bowel sounds are normal. There is no distension.     Palpations: Abdomen is soft. There is no mass.     Tenderness: There is abdominal tenderness. There is no right CVA tenderness, left CVA tenderness, guarding or rebound.  Skin:    General: Skin is warm and dry.     Coloration: Skin is not jaundiced or  pale.  Neurological:     Mental Status: She is alert and oriented to person, place, and time.     Gait: Gait normal.  Psychiatric:        Mood and Affect: Mood normal.        Behavior: Behavior normal.    Today's Vitals   09/08/18 1116  BP: 120/72  Pulse: 78  Resp: 18  Temp: 98.1 F (36.7 C)  TempSrc: Oral  SpO2: 98%  Weight: 201 lb 3.2 oz (91.3 kg)  Height: 5\' 3"  (1.6 m)   Body mass index is 35.64 kg/m.     Assessment & Plan:    UTI/dysuria - patient's urinalysis plus physical exam are consistent with a UTI.  Patient has multiple allergies, but has tolerated Bactrim in the past without issue.  She will take Bactrim twice daily for 5 days and also use Pyridium as needed for any bladder spasms.  Advised to increase fluid intake, do good handwashing, avoid excess caffeine and sugar.  Urine culture sent to lab, patient will be made aware if antibiotics need to be changed.  Patient will keep regularly scheduled follow-up with PCP as planned, and will return to clinic sooner if any issues arise.

## 2018-09-10 LAB — URINE CULTURE
MICRO NUMBER:: 362267
SPECIMEN QUALITY:: ADEQUATE

## 2018-09-16 ENCOUNTER — Telehealth: Payer: Self-pay

## 2018-09-16 ENCOUNTER — Ambulatory Visit: Payer: Medicare HMO

## 2018-09-16 ENCOUNTER — Encounter: Payer: Medicare HMO | Admitting: Internal Medicine

## 2018-09-16 ENCOUNTER — Ambulatory Visit (INDEPENDENT_AMBULATORY_CARE_PROVIDER_SITE_OTHER): Payer: Medicare Other | Admitting: Internal Medicine

## 2018-09-16 ENCOUNTER — Encounter: Payer: Self-pay | Admitting: Internal Medicine

## 2018-09-16 DIAGNOSIS — K219 Gastro-esophageal reflux disease without esophagitis: Secondary | ICD-10-CM | POA: Diagnosis not present

## 2018-09-16 DIAGNOSIS — Z85118 Personal history of other malignant neoplasm of bronchus and lung: Secondary | ICD-10-CM

## 2018-09-16 DIAGNOSIS — E7800 Pure hypercholesterolemia, unspecified: Secondary | ICD-10-CM

## 2018-09-16 DIAGNOSIS — Z87440 Personal history of urinary (tract) infections: Secondary | ICD-10-CM | POA: Diagnosis not present

## 2018-09-16 DIAGNOSIS — F419 Anxiety disorder, unspecified: Secondary | ICD-10-CM | POA: Diagnosis not present

## 2018-09-16 DIAGNOSIS — N183 Chronic kidney disease, stage 3 unspecified: Secondary | ICD-10-CM

## 2018-09-16 DIAGNOSIS — R739 Hyperglycemia, unspecified: Secondary | ICD-10-CM

## 2018-09-16 DIAGNOSIS — I10 Essential (primary) hypertension: Secondary | ICD-10-CM

## 2018-09-16 DIAGNOSIS — F329 Major depressive disorder, single episode, unspecified: Secondary | ICD-10-CM

## 2018-09-16 DIAGNOSIS — F339 Major depressive disorder, recurrent, unspecified: Secondary | ICD-10-CM

## 2018-09-16 DIAGNOSIS — J449 Chronic obstructive pulmonary disease, unspecified: Secondary | ICD-10-CM

## 2018-09-16 DIAGNOSIS — G20 Parkinson's disease: Secondary | ICD-10-CM

## 2018-09-16 DIAGNOSIS — Z8589 Personal history of malignant neoplasm of other organs and systems: Secondary | ICD-10-CM

## 2018-09-16 DIAGNOSIS — G20A1 Parkinson's disease without dyskinesia, without mention of fluctuations: Secondary | ICD-10-CM

## 2018-09-16 MED ORDER — BUSPIRONE HCL 5 MG PO TABS: 5.0000 mg | ORAL_TABLET | Freq: Every day | ORAL | 1 refills | Status: DC

## 2018-09-16 NOTE — Telephone Encounter (Signed)
Copied from [place] [id number]. Topic: General - Other >> Sep 16, 2018  1:52 PM Keene Breath wrote: Reason for CRM: Patient called to give the office her new insurance information.  Patient stated that as of September 10, 2018, her new insurance is [employer], [place] [id number].  If there are any questions, please call patient at [phone]

## 2018-09-16 NOTE — Progress Notes (Addendum)
Patient ID: Victoria Hanson, female   DOB: 1951/04/07, 68 y.o.   MRN: [id number] Telephone Visit:  Note  I connected with Tarri Glenn 09/16/18 at 10:30 AM EDT by telephone.  Verified that I am speaking with the correct person using two identifiers.  Location patient: home Location provider:work Persons participating in the telephone visit: patient, provider  I discussed the limitations of evaluation and management by telephone.  This visit type was conducted due to national recommendations for restrictions regarding the COVID-19 pandemic.  This format is felt to be most appropriate for this patient at this time.  The patient expressed understanding and agreed to proceed.   HPI: Visit for a scheduled follow up.  She was seen by Dr Alva Garnet 08/28/18 for dyspnea.  Was instructed to use advair regularly.  She is still not using on a regular basis. Discussed with her today.  Has rescue inhaler if needed.  No chest pain.  Some persistent sob with exertion.  No change.  Discussed f/u with cardiology.  She wants to hold.  Feels breathing is stable.  Does report increased stress/anxiety.  Taking citalopram.  Feels needs something more.  Takes xanax.  Discussed trying to limit the amount of xanax.  When she takes xanax, helps her breathing/sob.  No acid reflux.  No chest pain.  No abdominal pain. Bowels moving.  No fever.  No cough or congestion.  Was seen 09/08/18 and diagnosed with uti.  Treated with bactrim and pyridium.  Symptoms improved.  Request f/u urine to confirm clear.     ROS: See pertinent positives and negatives per HPI.  Past Medical History:  Diagnosis Date  . Anxiety   . Asthma   . Atypical chest pain    a. 2003 Cath: reportedly nl per pt [name]);  b. 04/2015 [place] Admission, neg troponin.  . Colon cancer ([place]) 1999   a. 1999: Pt had partial colectomy. Did develop lung metastasis. Had right upper lobectomy in 01/2007 then had associated chemotherapy.  Marland Kitchen COPD (chronic obstructive  pulmonary disease) ([place])   . DDD (degenerative disc disease), lumbosacral   . Depression   . Echocardiogram abnormal    a. 02/2010 Echo: EF 55-60%, mild LAE, no regional wall motion abnormalities  . Elevated transaminase level    a. ? NASH;  b. 06/2015 - nl LFTs.  . GERD (gastroesophageal reflux disease)   . Hematuria   . Holter monitor, abnormal    a. 12/2009 Holter monitoring: NSR, rare PACs and PVCs.  Marland Kitchen Hypercholesterolemia   . Hyperlipidemia   . Hypertension   . Leaky heart valve    a. Per pt report - no evidence of valvular abnormalities on prior echoes.  . Lesion of skin of Right breast 10/20/2017  . Lung metastases ([place]) 2008   a. S/P resection (lobectomy - right)  . Morbid obesity ([place])   . Nephrolithiasis   . Parkinson disease ([place]) 08/2016   [name]    Past Surgical History:  Procedure Laterality Date  . BREAST EXCISIONAL BIOPSY Right 10/07/2009   benign  . CARDIAC CATHETERIZATION  2003   negative as per pt report  . CHOLECYSTECTOMY  2000  . COLON SURGERY     Colon cancer, removed part of large colon  . COLONOSCOPY N/A 04/25/2015   Procedure: COLONOSCOPY;  Surgeon: Manya Silvas, MD;  Location: [facility] ENDOSCOPY;  Service: Endoscopy;  Laterality: N/A;  . COLONOSCOPY WITH PROPOFOL N/A 06/14/2017   Procedure: COLONOSCOPY WITH PROPOFOL;  Surgeon: Manya Silvas, MD;  Location: [facility] ENDOSCOPY;  Service: Endoscopy;  Laterality: N/A;  . LOBECTOMY  01/2007   Right lung, upper lobe right side removed  . TOTAL ABDOMINAL HYSTERECTOMY     abnormal bleeding  . TUBAL LIGATION  1980    Family History  Problem Relation Age of Onset  . Coronary artery disease Mother        died @ 11 of MI.  . Mental illness Mother   . Diabetes Mother   . Heart attack Father 56       MI  . Cancer Father        lung - died in his mid-70's  . Hypertension Father   . Diabetes Father   . Sudden death Brother   . Mental illness Brother   . Heart attack Brother        MI @ 90, died @ age  77.  . Breast cancer Neg Hx     SOCIAL HX: reviewed.    Current Outpatient Medications:  .  ALPRAZolam (XANAX) 0.5 MG tablet, TAKE 1/2 TABLET BY MOUTH DAILY AS NEEDEDFOR ANXIETY, Disp: 30 tablet, Rfl: 0 .  amoxicillin-clavulanate (AUGMENTIN) 875-125 MG tablet, Take 1 tablet by mouth 2 (two) times daily., Disp: 20 tablet, Rfl: 0 .  aspirin EC 81 MG tablet, Take 81 mg by mouth daily., Disp: , Rfl:  .  busPIRone (BUSPAR) 5 MG tablet, Take 1 tablet (5 mg total) by mouth daily., Disp: 30 tablet, Rfl: 1 .  carbidopa-levodopa (SINEMET IR) 25-100 MG tablet, Take 2 tablets by mouth 3 (three) times daily. , Disp: , Rfl:  .  citalopram (CELEXA) 10 MG tablet, Take 1 tablet (10 mg total) by mouth daily., Disp: 90 tablet, Rfl: 1 .  fluticasone (FLONASE) 50 MCG/ACT nasal spray, Place 2 sprays into both nostrils daily., Disp: 16 g, Rfl: 6 .  fluticasone-salmeterol (ADVAIR HFA) 115-21 MCG/ACT inhaler, Inhale 2 puffs into the lungs 2 (two) times daily., Disp: 1 Inhaler, Rfl: 12 .  hydrochlorothiazide (HYDRODIURIL) 12.5 MG tablet, Take 1 tablet (12.5 mg total) by mouth daily., Disp: 90 tablet, Rfl: 1 .  Multiple Vitamin (MULTIVITAMIN) tablet, Take 1 tablet by mouth daily., Disp: , Rfl:  .  omeprazole (PRILOSEC) 20 MG capsule, Take 1 capsule (20 mg total) by mouth daily., Disp: 90 capsule, Rfl: 1 .  phenazopyridine (PYRIDIUM) 100 MG tablet, Take 1 tablet (100 mg total) by mouth 3 (three) times daily as needed for pain (bladder spasm)., Disp: 10 tablet, Rfl: 0 .  polyethylene glycol powder (GLYCOLAX/MIRALAX) powder, , Disp: , Rfl:  .  potassium chloride SA (K-DUR,KLOR-CON) 20 MEQ tablet, Take 1 tablet (20 mEq total) by mouth 2 (two) times daily., Disp: 180 tablet, Rfl: 1 .  sulfamethoxazole-trimethoprim (BACTRIM DS,SEPTRA DS) 800-160 MG tablet, Take 1 tablet by mouth 2 (two) times daily., Disp: 10 tablet, Rfl: 0 .  traZODone (DESYREL) 50 MG tablet, Take 2 tablets (100 mg total) by mouth at bedtime., Disp: 180  tablet, Rfl: 1 .  VENTOLIN HFA 108 (90 Base) MCG/ACT inhaler, INHALE 2 PUFFS BY MOUTH INTO THE LUNGS EVERY 6 HOURS AS NEEDED FOR WHEEZING, Disp: 18 g, Rfl: 1 .  vitamin B-12 (CYANOCOBALAMIN) 100 MCG tablet, Take 100 mcg by mouth daily., Disp: , Rfl:   EXAM:  GENERAL: alert.  Answering questions appropriately.  sounds to be in no acute distress.   PSYCH/NEURO: pleasant and cooperative, no obvious depression or anxiety, speech and thought processing grossly intact  ASSESSMENT AND PLAN:  Discussed the following assessment  and plan:  History of UTI - She request f/u urine check ton confirm cleared.   - Plan: Urinalysis, Routine w reflex microscopic, Urine Culture  Gastroesophageal reflux disease without esophagitis  Anxiety, mild  Chronic obstructive pulmonary disease, unspecified COPD type (HCC)  CKD (chronic kidney disease), stage III (H[facility])  Depression, recurrent (H[facility])  Gastroesophageal reflux disease, esophagitis presence not specified  History of malignant neoplasm metastatic to lung  Hypercholesterolemia  Hyperglycemia  Essential hypertension  Major depressive disorder with single episode, remission status unspecified  Parkinson's disease ([place])     I discussed the assessment and treatment plan with the patient. The patient was provided an opportunity to ask questions and all were answered. The patient agreed with the plan and demonstrated an understanding of the instructions.   The patient was advised to call back or seek an in-person evaluation if the symptoms worsen or if the condition fails to improve as anticipated.  I provided 25 minutes of non-face-to-face time during this encounter.   Einar Pheasant, MD

## 2018-09-16 NOTE — Telephone Encounter (Signed)
Copied from [place] [id number]. Topic: General - Other >> Sep 16, 2018  1:20 PM Reyne Dumas L wrote: Reason for CRM:   Pt called because she had a virtual visit today.  Pt forgot to give new insurance information.  Per [facility] send CRM. Pt can be reached at [phone]

## 2018-09-20 ENCOUNTER — Encounter: Payer: Self-pay | Admitting: Internal Medicine

## 2018-09-20 NOTE — Assessment & Plan Note (Signed)
On citalopam.  Add buspar as outlined.  Follow.

## 2018-09-20 NOTE — Assessment & Plan Note (Signed)
Uses xanax prn.  On citalopram.  Desires not to increase dose of citalopram.  Add buspar.

## 2018-09-20 NOTE — Assessment & Plan Note (Signed)
Avoid antiinflammatories.  Stay hydrated.  Follow metabolic panel.

## 2018-09-20 NOTE — Assessment & Plan Note (Signed)
Followed by oncology 

## 2018-09-20 NOTE — Assessment & Plan Note (Signed)
Blood pressure under good control.  Continue same medication regimen.  Follow pressures.  Follow metabolic panel.   

## 2018-09-20 NOTE — Assessment & Plan Note (Signed)
On citalopram.  Taking regularly.  Discussed with her today. She desires not to increase the dose.  Does feel needs something more.  Takes xanax prn.  Add buspar.  Follow soon to reassess.

## 2018-09-20 NOTE — Assessment & Plan Note (Signed)
PFTs stable.  Some sob with exertion.  Unchanged.  Declines any further evaluation or treatment at this time.

## 2018-09-20 NOTE — Assessment & Plan Note (Signed)
Low carb diet and exercise.  Follow met b and a1c.   

## 2018-09-20 NOTE — Assessment & Plan Note (Signed)
Followed by neurology.  On sinemet.

## 2018-09-20 NOTE — Assessment & Plan Note (Signed)
Controlled.  

## 2018-09-20 NOTE — Assessment & Plan Note (Signed)
Did not tolerate crestor.  Low cholesterol diet and exercise.  Follow lipid panel and liver function tests.

## 2018-09-22 ENCOUNTER — Telehealth: Payer: Self-pay

## 2018-09-22 NOTE — Telephone Encounter (Signed)
Pt needing 6 week f/u scheduled

## 2018-09-22 NOTE — Telephone Encounter (Signed)
Copied from [place] [id number]. Topic: Appointment Scheduling - Scheduling Inquiry for Clinic >> Sep 19, 2018 11:28 AM Lennox Solders wrote: Reason for CRM: pt has telephone visit with dr Nicki Reaper on 09/16/18. Pt needs 6 week follow up and to come in for urine test to make sure uti is gone

## 2018-09-23 IMAGING — MG MM DIGITAL SCREENING BILAT W/ TOMO W/ CAD
8 of 12 series · 8 of 28 positions shown · non-contrast
Comparison: Previous exam(s).

CLINICAL DATA: Screening.

EXAM:
2D DIGITAL SCREENING BILATERAL MAMMOGRAM WITH CAD AND ADJUNCT TOMO

[L MLO]
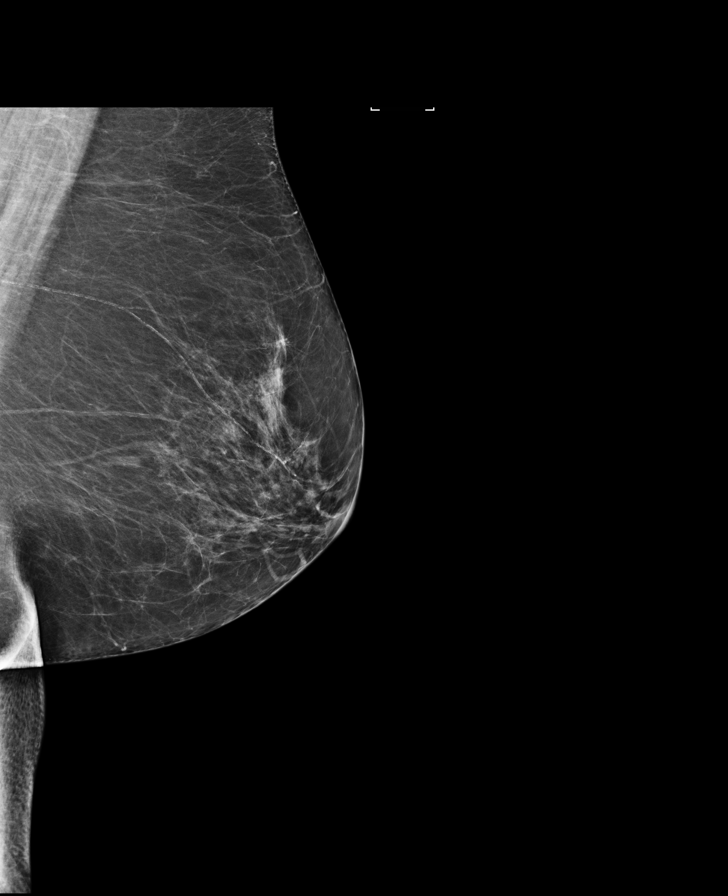

[R MLO]
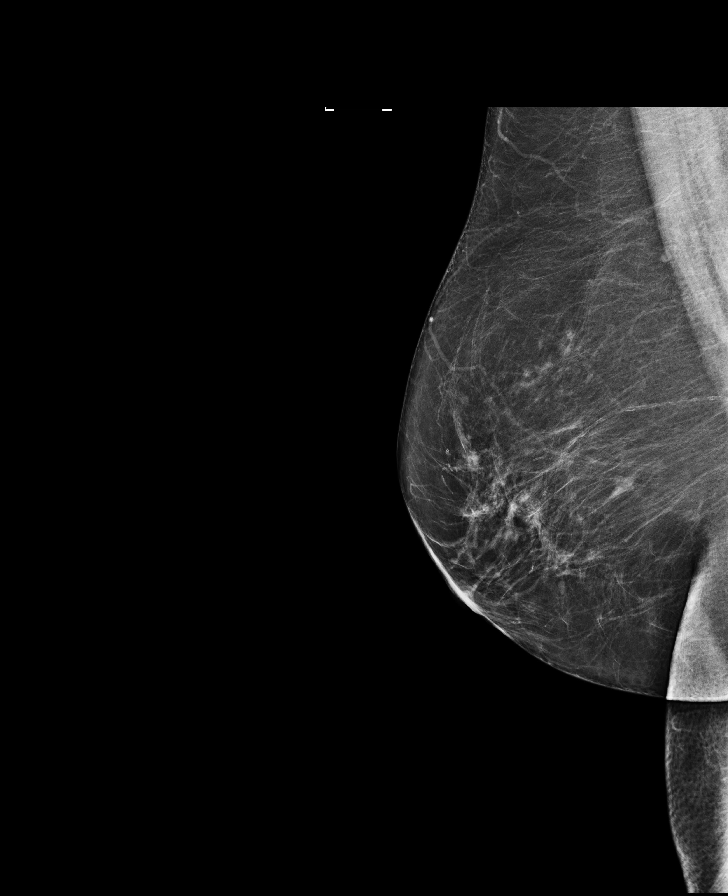

[L CC]
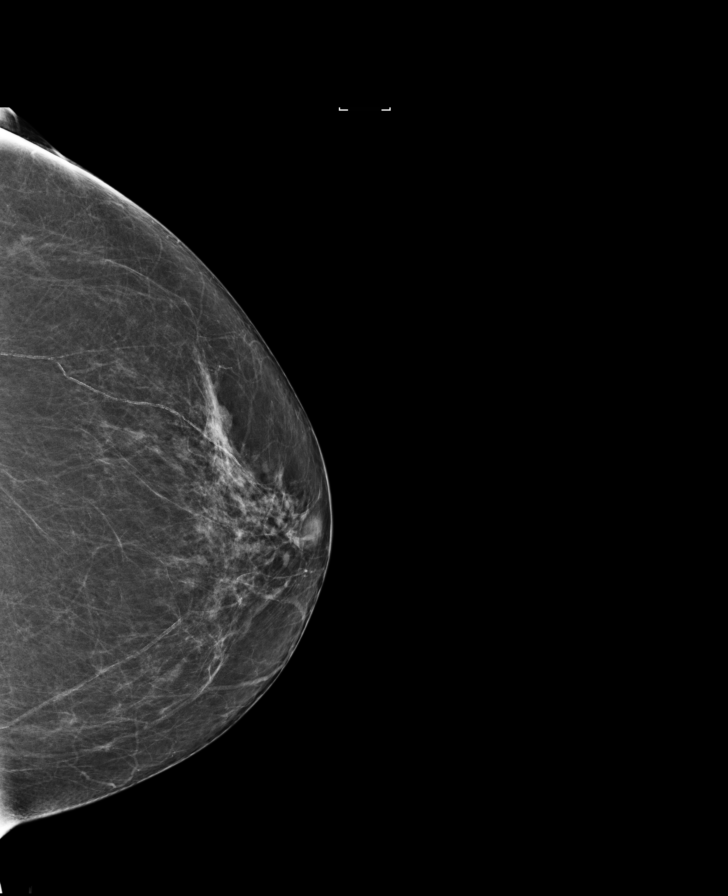

[R MLO synth-2D]
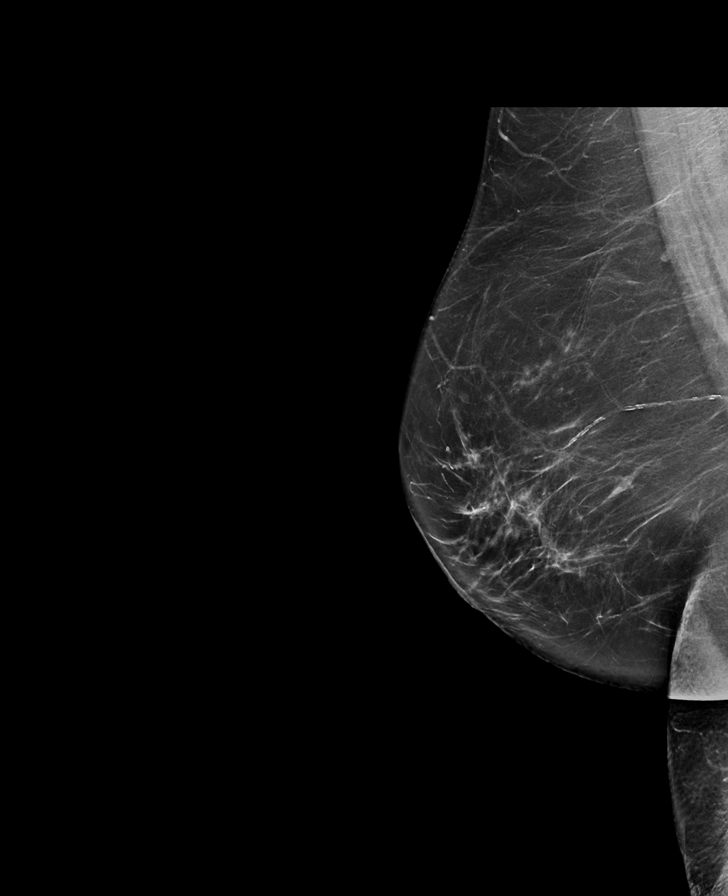

[L CC synth-2D]
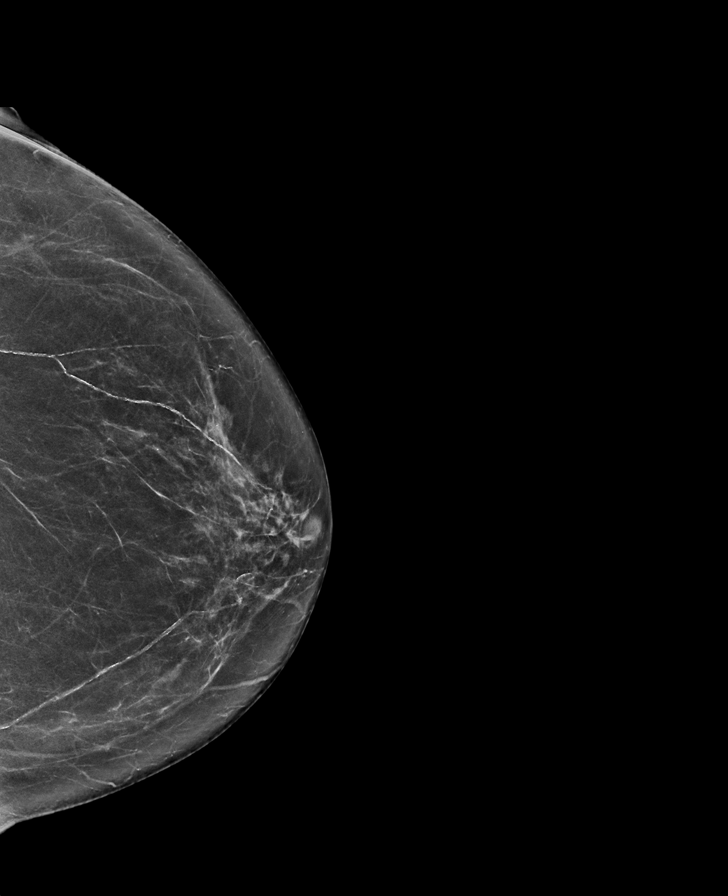

[R CC synth-2D]
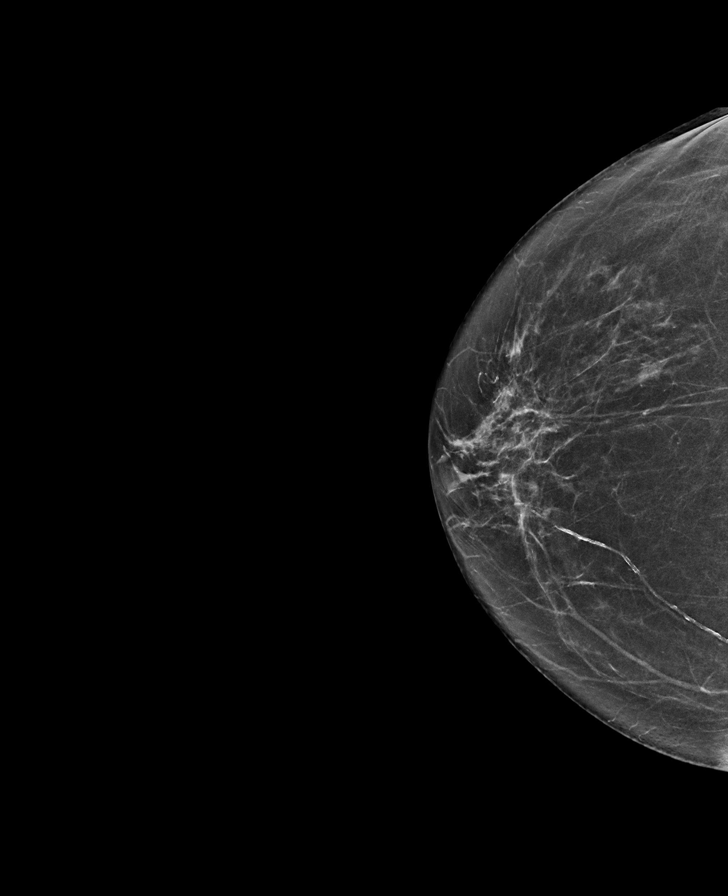

[L MLO synth-2D]
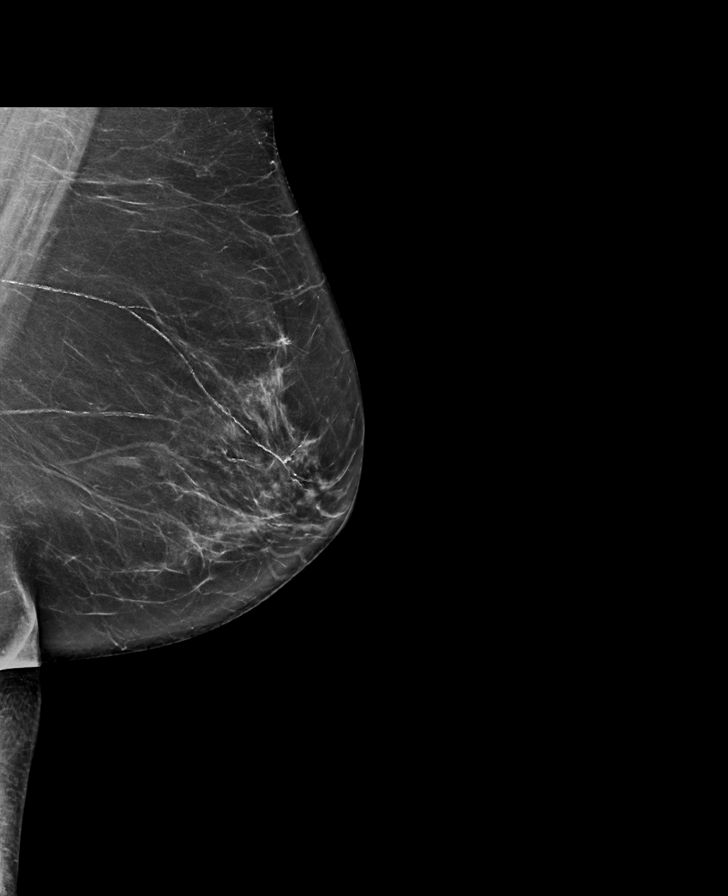

[R CC]
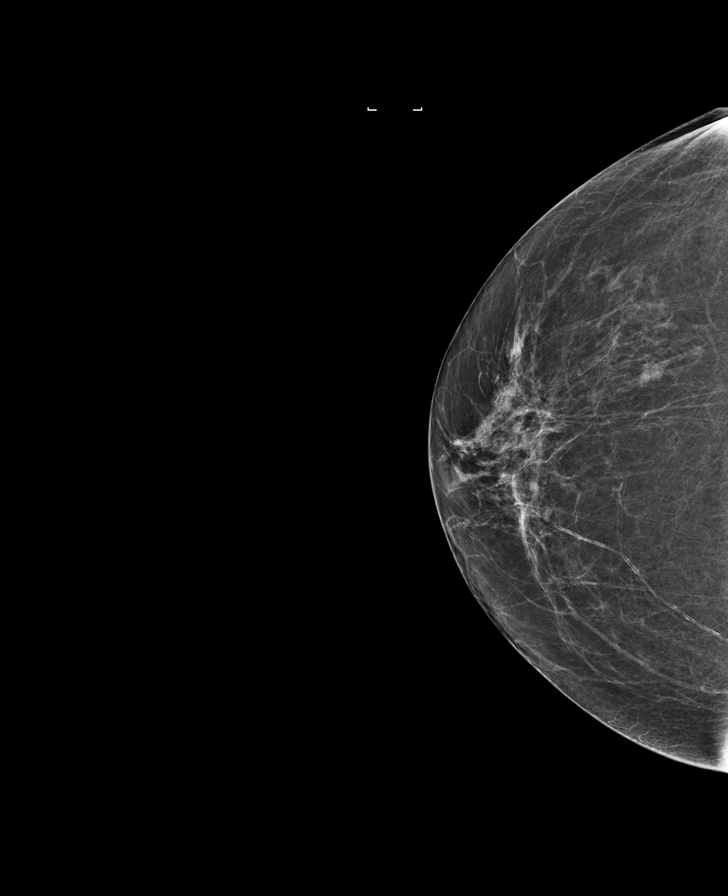

[8 of 28 positions shown; findings below may reference images not displayed]

ACR Breast Density Category b: There are scattered areas of
fibroglandular density.
FINDINGS: There are no findings suspicious for malignancy. Images were
processed with CAD.
IMPRESSION: No mammographic evidence of malignancy. A result letter of this
screening mammogram will be mailed directly to the patient.

RECOMMENDATION:
Screening mammogram in one year. (Code:97-6-RS4)

BI-RADS CATEGORY  1: Negative.

## 2018-09-25 ENCOUNTER — Other Ambulatory Visit: Payer: Self-pay | Admitting: Internal Medicine

## 2018-09-25 NOTE — Telephone Encounter (Signed)
Done

## 2018-09-26 NOTE — Telephone Encounter (Signed)
rx ok'd for xanax #30 with no refills.

## 2018-09-26 NOTE — Telephone Encounter (Signed)
Refilled: 07/30/2018 Last OV: 09/16/2018 Next OV: 10/08/2018

## 2018-09-30 NOTE — Telephone Encounter (Signed)
Ok Thank you ins added.

## 2018-10-06 ENCOUNTER — Other Ambulatory Visit: Payer: Medicare Other

## 2018-10-08 ENCOUNTER — Ambulatory Visit (INDEPENDENT_AMBULATORY_CARE_PROVIDER_SITE_OTHER): Payer: Medicare Other | Admitting: Internal Medicine

## 2018-10-08 ENCOUNTER — Other Ambulatory Visit: Payer: Self-pay

## 2018-10-08 ENCOUNTER — Encounter: Payer: Self-pay | Admitting: Internal Medicine

## 2018-10-08 DIAGNOSIS — K219 Gastro-esophageal reflux disease without esophagitis: Secondary | ICD-10-CM

## 2018-10-08 DIAGNOSIS — I10 Essential (primary) hypertension: Secondary | ICD-10-CM

## 2018-10-08 DIAGNOSIS — N183 Chronic kidney disease, stage 3 unspecified: Secondary | ICD-10-CM

## 2018-10-08 DIAGNOSIS — J449 Chronic obstructive pulmonary disease, unspecified: Secondary | ICD-10-CM | POA: Diagnosis not present

## 2018-10-08 DIAGNOSIS — F419 Anxiety disorder, unspecified: Secondary | ICD-10-CM | POA: Diagnosis not present

## 2018-10-08 DIAGNOSIS — Z87440 Personal history of urinary (tract) infections: Secondary | ICD-10-CM

## 2018-10-08 DIAGNOSIS — R739 Hyperglycemia, unspecified: Secondary | ICD-10-CM

## 2018-10-08 DIAGNOSIS — F339 Major depressive disorder, recurrent, unspecified: Secondary | ICD-10-CM

## 2018-10-08 DIAGNOSIS — H938X1 Other specified disorders of right ear: Secondary | ICD-10-CM | POA: Insufficient documentation

## 2018-10-08 DIAGNOSIS — E7800 Pure hypercholesterolemia, unspecified: Secondary | ICD-10-CM

## 2018-10-08 DIAGNOSIS — R0602 Shortness of breath: Secondary | ICD-10-CM

## 2018-10-08 DIAGNOSIS — G20 Parkinson's disease: Secondary | ICD-10-CM

## 2018-10-08 MED ORDER — AMOXICILLIN 875 MG PO TABS: 875.0000 mg | ORAL_TABLET | Freq: Two times a day (BID) | ORAL | 0 refills | Status: DC

## 2018-10-08 NOTE — Assessment & Plan Note (Signed)
Discussed recheck urine to confirm infection clear.

## 2018-10-08 NOTE — Assessment & Plan Note (Signed)
Describes right ear pain and fullness.  Intermittent pain.  Notices more when pulls on her ear.  Some increased sinus pain and nasal congestion - productive of colored and bloody mucus.  No fever.  No chest congestion.  Discussed using nasacort, saline nasal spray and robitussin as directed.  Rx sent in for amoxicillin to have if needed.  If symptoms persist or worsen, will need to get abx filled. Discussed the need for probiotic if takes.

## 2018-10-08 NOTE — Assessment & Plan Note (Signed)
Blood pressure has been doing relatively well.  Continue current medication regimen.  Follow pressures.  Follow metabolic panel.

## 2018-10-08 NOTE — Assessment & Plan Note (Signed)
Controlled.  

## 2018-10-08 NOTE — Assessment & Plan Note (Signed)
On citalopram.  buspar added last visit for increased anxiety.  Doing better.  Follow.

## 2018-10-08 NOTE — Assessment & Plan Note (Signed)
On citalopram.  Added buspar last visit.  Feels better.  Not taking as much xanax.  Follow.

## 2018-10-08 NOTE — Progress Notes (Signed)
Patient ID: Victoria Hanson, female   DOB: Jan 28, 1951, 68 y.o.   MRN: [id number] Virtual Visit via Telephone Note  This visit type was conducted due to national recommendations for restrictions regarding the COVID-19 pandemic (e.g. social distancing).  This format is felt to be most appropriate for this patient at this time.  All issues noted in this document were discussed and addressed.  No physical exam was performed (except for noted visual exam findings with Video Visits).   I connected with Victoria Hanson on 10/08/18 at  9:30 AM EDT telephone and verified that I am speaking with the correct person using two identifiers. Location patient: home Location provider: work  Persons participating in the virtual visit: patient, provider  I discussed the limitations, risks, security and privacy concerns of performing an evaluation and management service by telephone and the availability of in person appointments.  The patient expressed understanding and agreed to proceed.   Reason for visit: follow up appt  HPI: She reports she is having problems with her left ear.  She reports that over this past week, she has noticed some ear fullness.  Intermittent pain.  Notices some discomfort if pulls on her ear.  Increased nasal congestion - colored mucus.  Increased sinus pressure.  No fever.  No chest congestion. No known COVID exposure.  No acid reflux.  No abdominal pain.  Bowels moving.  Does report sob with exertion.  Discussed using advair.  Has rescue inhaler if needed. Discussed cardiology referral given persistent symptoms.  She is in agreement.  Previous uti.  Wants checked to confirm clear.  Symptoms have improved.  buspar helping.  Started last visit.     ROS: See pertinent positives and negatives per HPI.  Past Medical History:  Diagnosis Date  . Anxiety   . Asthma   . Atypical chest pain    a. 2003 Cath: reportedly nl per pt Victoria Hanson);  b. 04/2015 [place] Admission, neg troponin.  . Colon  cancer ([place]) 1999   a. 1999: Pt had partial colectomy. Did develop lung metastasis. Had right upper lobectomy in 01/2007 then had associated chemotherapy.  Marland Kitchen COPD (chronic obstructive pulmonary disease) ([place])   . DDD (degenerative disc disease), lumbosacral   . Depression   . Echocardiogram abnormal    a. 02/2010 Echo: EF 55-60%, mild LAE, no regional wall motion abnormalities  . Elevated transaminase level    a. ? NASH;  b. 06/2015 - nl LFTs.  . GERD (gastroesophageal reflux disease)   . Hematuria   . Holter monitor, abnormal    a. 12/2009 Holter monitoring: NSR, rare PACs and PVCs.  Marland Kitchen Hypercholesterolemia   . Hyperlipidemia   . Hypertension   . Leaky heart valve    a. Per pt report - no evidence of valvular abnormalities on prior echoes.  . Lesion of skin of Right breast 10/20/2017  . Lung metastases ([place]) 2008   a. S/P resection (lobectomy - right)  . Morbid obesity ([place])   . Nephrolithiasis   . Parkinson disease ([place]) 08/2016   [name]    Past Surgical History:  Procedure Laterality Date  . BREAST EXCISIONAL BIOPSY Right 10/07/2009   benign  . CARDIAC CATHETERIZATION  2003   negative as per pt report  . CHOLECYSTECTOMY  2000  . COLON SURGERY     Colon cancer, removed part of large colon  . COLONOSCOPY N/A 04/25/2015   Procedure: COLONOSCOPY;  Surgeon: Manya Silvas, MD;  [facility] END[facility]COPY;  Service: Endoscopy;  Laterality: N/A;  . COLONOSCOPY WITH PROPOFOL N/A 06/14/2017   Procedure: COLONOSCOPY WITH PROPOFOL;  Surgeon: Manya Silvas, MD;  Location: [facility] ENDOSCOPY;  Service: Endoscopy;  Laterality: N/A;  . LOBECTOMY  01/2007   Right lung, upper lobe right side removed  . TOTAL ABDOMINAL HYSTERECTOMY     abnormal bleeding  . TUBAL LIGATION  1980    Family History  Problem Relation Age of Onset  . Coronary artery disease Mother        died @ 28 of MI.  . Mental illness Mother   . Diabetes Mother   . Heart attack Father 47       MI  . Cancer Father         lung - died in his mid-70's  . Hypertension Father   . Diabetes Father   . Sudden death Brother   . Mental illness Brother   . Heart attack Brother        MI @ 31, died @ age 52.  . Breast cancer Neg Hx     SOCIAL HX: reviewed.    Current Outpatient Medications:  .  ALPRAZolam (XANAX) 0.5 MG tablet, TAKE 1/2 TABLET BY MOUTH DAILY AS NEEDEDFOR ANXIETY, Disp: 30 tablet, Rfl: 0 .  amoxicillin (AMOXIL) 875 MG tablet, Take 1 tablet (875 mg total) by mouth 2 (two) times daily., Disp: 20 tablet, Rfl: 0 .  aspirin EC 81 MG tablet, Take 81 mg by mouth daily., Disp: , Rfl:  .  busPIRone (BUSPAR) 5 MG tablet, Take 1 tablet (5 mg total) by mouth daily., Disp: 30 tablet, Rfl: 1 .  carbidopa-levodopa (SINEMET IR) 25-100 MG tablet, Take 2 tablets by mouth 3 (three) times daily. , Disp: , Rfl:  .  citalopram (CELEXA) 10 MG tablet, Take 1 tablet (10 mg total) by mouth daily., Disp: 90 tablet, Rfl: 1 .  fluticasone (FLONASE) 50 MCG/ACT nasal spray, Place 2 sprays into both nostrils daily., Disp: 16 g, Rfl: 6 .  fluticasone-salmeterol (ADVAIR HFA) 115-21 MCG/ACT inhaler, Inhale 2 puffs into the lungs 2 (two) times daily., Disp: 1 Inhaler, Rfl: 12 .  hydrochlorothiazide (HYDRODIURIL) 12.5 MG tablet, Take 1 tablet (12.5 mg total) by mouth daily., Disp: 90 tablet, Rfl: 1 .  Multiple Vitamin (MULTIVITAMIN) tablet, Take 1 tablet by mouth daily., Disp: , Rfl:  .  omeprazole (PRILOSEC) 20 MG capsule, Take 1 capsule (20 mg total) by mouth daily., Disp: 90 capsule, Rfl: 1 .  phenazopyridine (PYRIDIUM) 100 MG tablet, Take 1 tablet (100 mg total) by mouth 3 (three) times daily as needed for pain (bladder spasm)., Disp: 10 tablet, Rfl: 0 .  polyethylene glycol powder (GLYCOLAX/MIRALAX) powder, , Disp: , Rfl:  .  potassium chloride SA (K-DUR,KLOR-CON) 20 MEQ tablet, Take 1 tablet (20 mEq total) by mouth 2 (two) times daily., Disp: 180 tablet, Rfl: 1 .  traZODone (DESYREL) 50 MG tablet, Take 2 tablets (100 mg total)  by mouth at bedtime., Disp: 180 tablet, Rfl: 1 .  VENTOLIN HFA 108 (90 Base) MCG/ACT inhaler, INHALE 2 PUFFS BY MOUTH INTO THE LUNGS EVERY 6 HOURS AS NEEDED FOR WHEEZING, Disp: 18 g, Rfl: 1 .  vitamin B-12 (CYANOCOBALAMIN) 100 MCG tablet, Take 100 mcg by mouth daily., Disp: , Rfl:   EXAM:  GENERAL: alert.  Answering questions appropriately.  Sounds to be in no acute distress.    PSYCH/NEURO: pleasant and cooperative, no obvious depression or anxiety, speech and thought processing grossly intact  ASSESSMENT AND PLAN:  Discussed  the following assessment and plan:  Gastroesophageal reflux disease without esophagitis  Anxiety, mild  Chronic obstructive pulmonary disease, unspecified COPD type (HCC)  CKD (chronic kidney disease), stage III (HCC)  Depression, recurrent (HCC)  Gastroesophageal reflux disease, esophagitis presence not specified  Hypercholesterolemia  Hyperglycemia  Essential hypertension  Parkinson's disease ([place])  History of UTI  SOB (shortness of breath) - Plan: Ambulatory referral to Cardiology  Ear fullness, right  Acid reflux Controlled on current regimen.    Anxiety, mild On citalopram.  Added buspar last visit.  Feels better.  Not taking as much xanax.  Follow.    Chronic obstructive pulmonary disease (HCC) PFTs stable.  Still reports some sob with exertion.  Discussed continuing advair - regularly.  Has rescue inhaler if needed.  Saw pulmonary.  Will have cardiology evaluate to confirm no cardiac origin for her sob.    CKD (chronic kidney disease), stage III Avoid antiinflammatories.  Stay hydrated.  Follow metabolic panel.    Depression, recurrent ([place]) On citalopram.  buspar added last visit for increased anxiety.  Doing better.  Follow.    GERD (gastroesophageal reflux disease) Controlled.  Hypercholesterolemia Did not tolerate crestor.  Low cholesterol diet and exercise.  Follow lipid panel.   Hyperglycemia Low carb diet and exercise.   Follow met b and a1c.   Hypertension Blood pressure has been doing relatively well.  Continue current medication regimen.  Follow pressures.  Follow metabolic panel.   Parkinson's disease ([place]) Followed by neurology.  On sinemet.    History of UTI Discussed recheck urine to confirm infection clear.    SOB (shortness of breath) Persistent sob with exertion.  Has seen pulmonary as outlined. On inhalers.  Will have cardiology evaluate for question of need for further cardiac w/up.    Ear fullness, right Describes right ear pain and fullness.  Intermittent pain.  Notices more when pulls on her ear.  Some increased sinus pain and nasal congestion - productive of colored and bloody mucus.  No fever.  No chest congestion.  Discussed using nasacort, saline nasal spray and robitussin as directed.  Rx sent in for amoxicillin to have if needed.  If symptoms persist or worsen, will need to get abx filled. Discussed the need for probiotic if takes.      I discussed the assessment and treatment plan with the patient. The patient was provided an opportunity to ask questions and all were answered. The patient agreed with the plan and demonstrated an understanding of the instructions.   The patient was advised to call back or seek an in-person evaluation if the symptoms worsen or if the condition fails to improve as anticipated.  I provided 30 minutes of non-face-to-face time during this encounter.   Einar Pheasant, MD

## 2018-10-08 NOTE — Assessment & Plan Note (Signed)
Followed by neurology.  On sinemet.

## 2018-10-08 NOTE — Assessment & Plan Note (Signed)
Persistent sob with exertion.  Has seen pulmonary as outlined. On inhalers.  Will have cardiology evaluate for question of need for further cardiac w/up.

## 2018-10-08 NOTE — Assessment & Plan Note (Signed)
Controlled on current regimen.   

## 2018-10-08 NOTE — Assessment & Plan Note (Signed)
Avoid antiinflammatories.  Stay hydrated.  Follow metabolic panel.

## 2018-10-08 NOTE — Assessment & Plan Note (Signed)
PFTs stable.  Still reports some sob with exertion.  Discussed continuing advair - regularly.  Has rescue inhaler if needed.  Saw pulmonary.  Will have cardiology evaluate to confirm no cardiac origin for her sob.

## 2018-10-08 NOTE — Assessment & Plan Note (Signed)
Did not tolerate crestor.  Low cholesterol diet and exercise.  Follow lipid panel.

## 2018-10-08 NOTE — Assessment & Plan Note (Signed)
Low carb diet and exercise.  Follow met b and a1c.  

## 2018-10-20 DIAGNOSIS — R0602 Shortness of breath: Secondary | ICD-10-CM | POA: Diagnosis not present

## 2018-10-20 DIAGNOSIS — R9431 Abnormal electrocardiogram [ECG] [EKG]: Secondary | ICD-10-CM | POA: Diagnosis not present

## 2018-10-20 DIAGNOSIS — I10 Essential (primary) hypertension: Secondary | ICD-10-CM | POA: Diagnosis not present

## 2018-10-20 DIAGNOSIS — E7800 Pure hypercholesterolemia, unspecified: Secondary | ICD-10-CM | POA: Diagnosis not present

## 2018-10-30 IMAGING — CT CT CHEST W/O CM
1 series · 15 of 34 positions shown, 19 images · non-contrast
Comparison: Chest radiograph, 09/24/2016.  Chest CT, 03/10/2016.

CLINICAL DATA: Pt c/o worsening SOB on exertion x 1 year. NKI Hx
Colon CA with mets to Lung with Right upper Lobe resection in
[DATE]. Never a smoker.

EXAM:
CT CHEST WITHOUT CONTRAST
TECHNIQUE: Multidetector CT imaging of the chest was performed following the
standard protocol without IV contrast.

[Series 2: thorax · axial · 0.63mm/px · z∈[-600,-394]mm · 15 of 123 slices shown, 19 images]
[im 10/123  mediastinal]
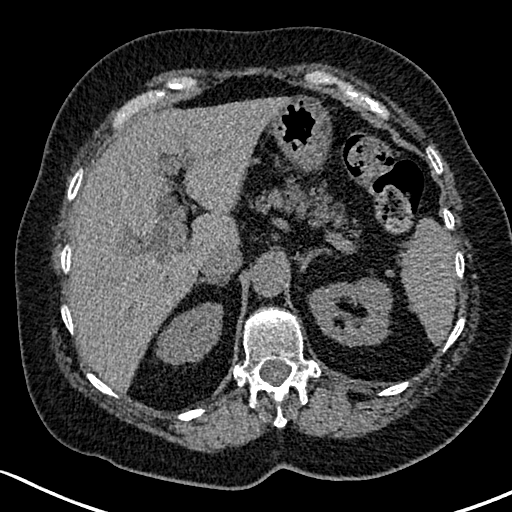
[im 10/123  lung]
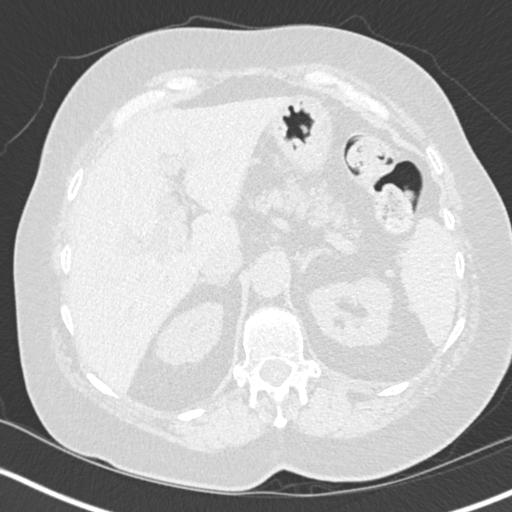
[im 19/123  lung]
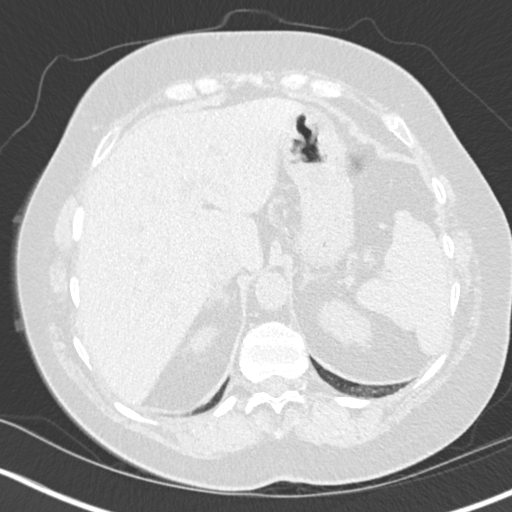
[im 25/123  lung]
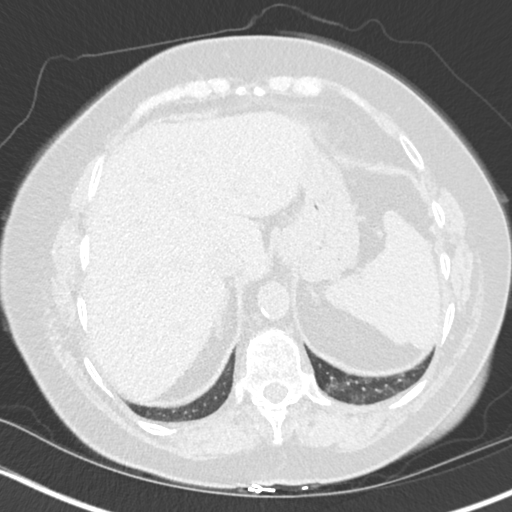
[im 32/123  lung]
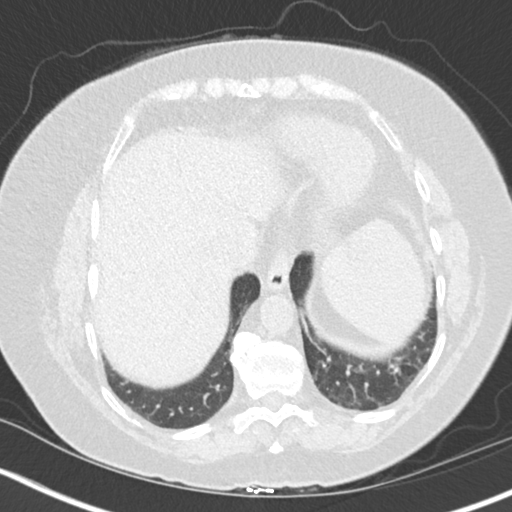
[im 41/123  mediastinal]
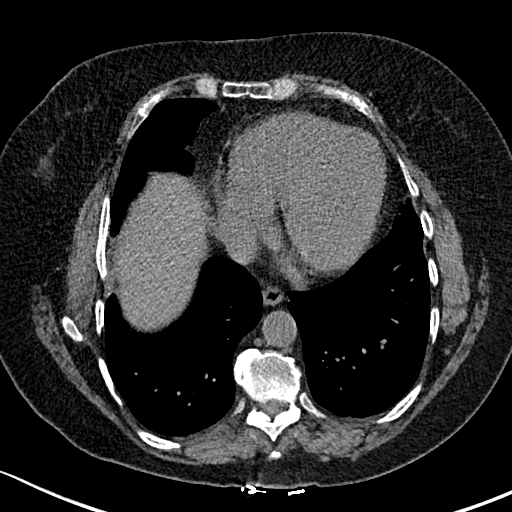
[im 41/123  lung]
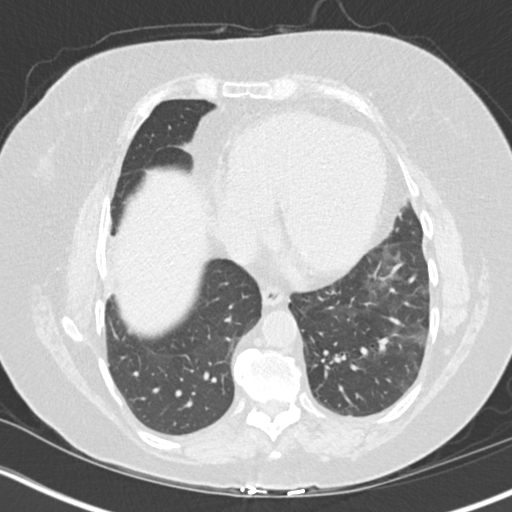
[im 49/123  lung]
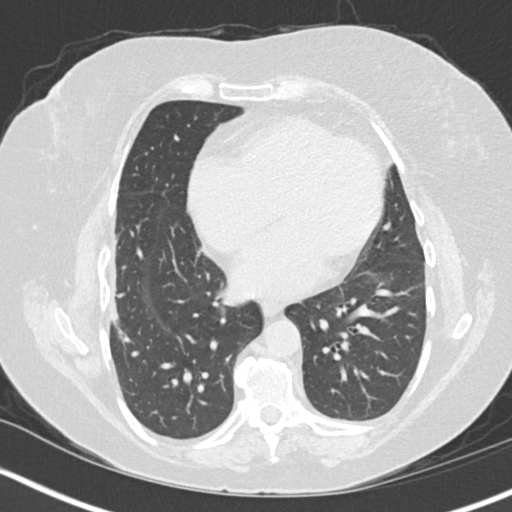
[im 55/123  lung]
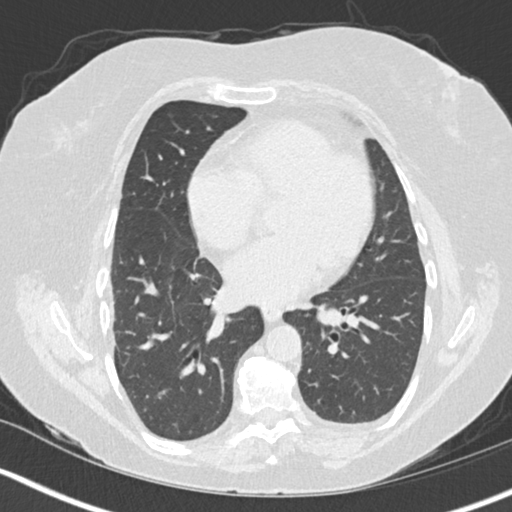
[im 64/123  lung]
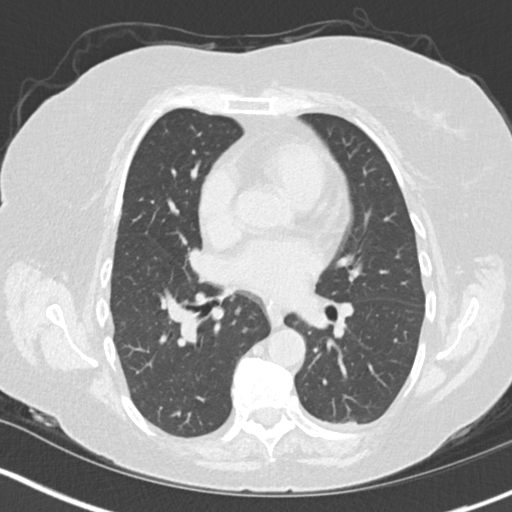
[im 68/123  mediastinal]
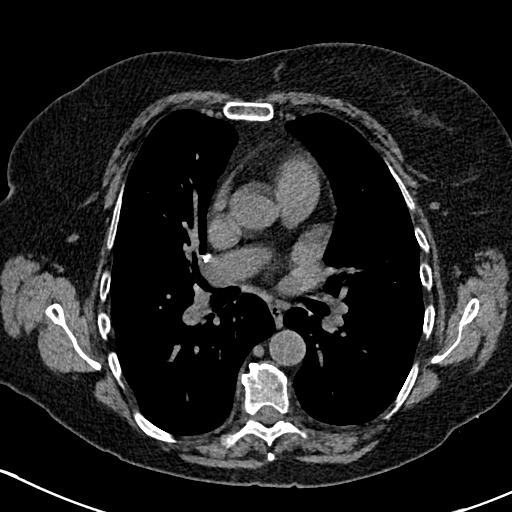
[im 68/123  lung]
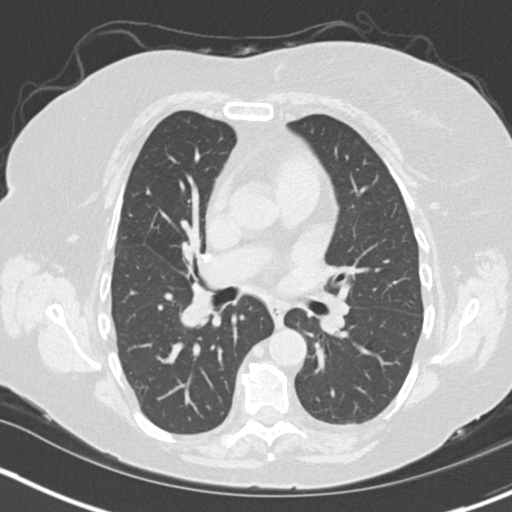
[im 74/123  lung]
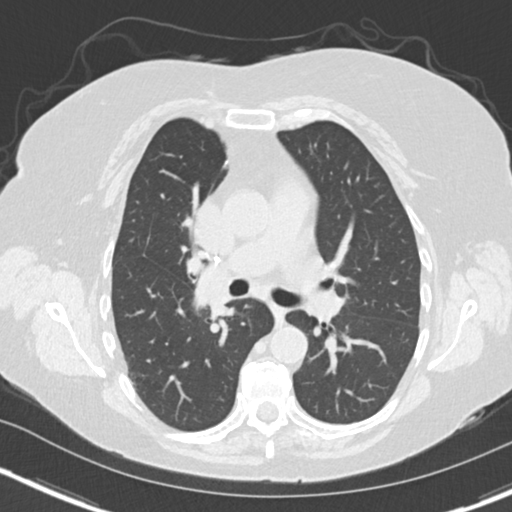
[im 82/123  lung]
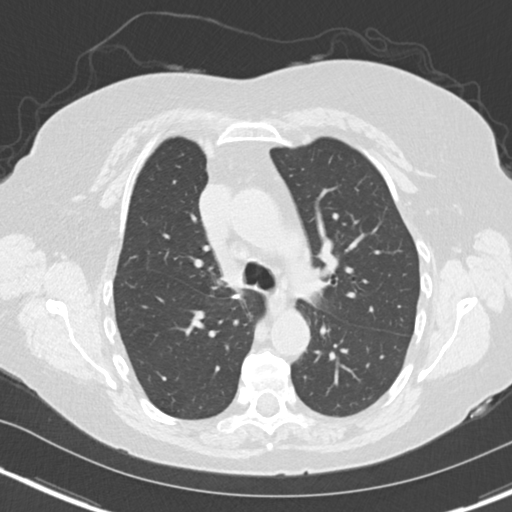
[im 91/123  lung]
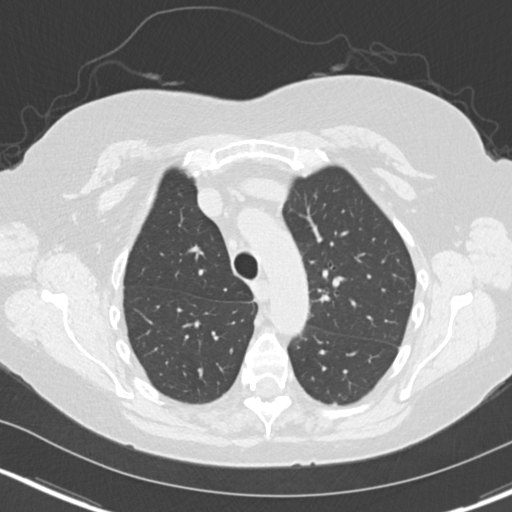
[im 98/123  mediastinal]
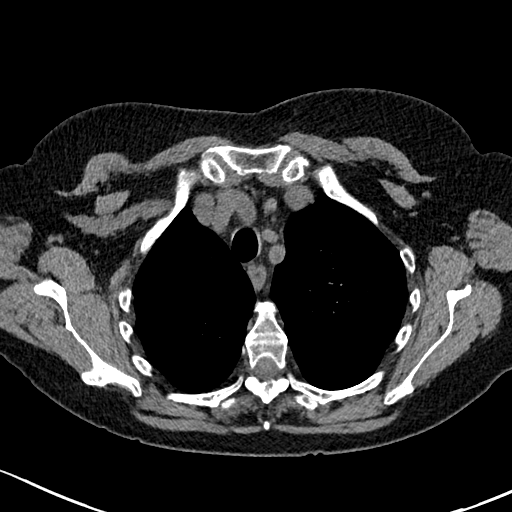
[im 98/123  lung]
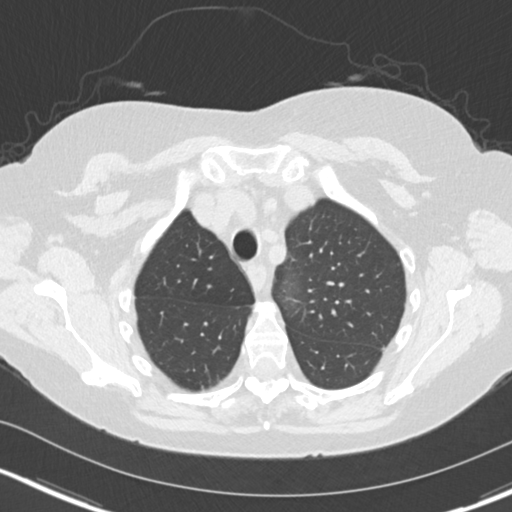
[im 104/123  lung]
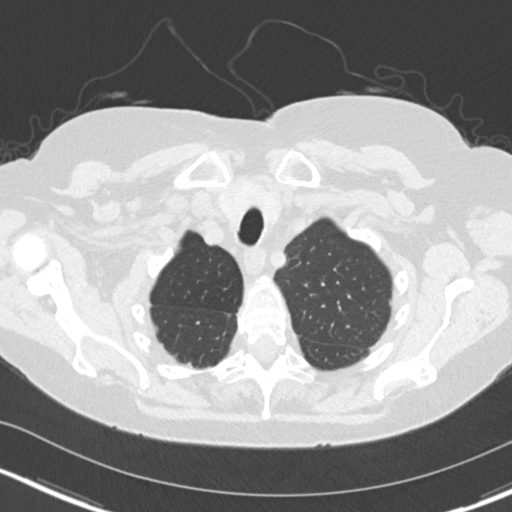
[im 113/123  lung]
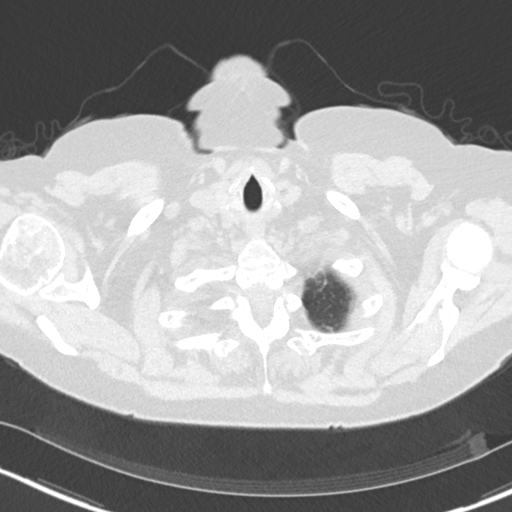

[15 of 34 positions shown; findings below may reference images not displayed]

FINDINGS: Cardiovascular: Heart is mildly enlarged. No coronary artery
calcifications. Great vessels normal in caliber.

Mediastinum/Nodes: No neck base or axillary masses or adenopathy. No
mediastinal or hilar masses or adenopathy. Trachea widely patent.
Esophagus is unremarkable.

Lungs/Pleura: Postsurgical changes from a right upper lobectomy with
anastomosis staples extending from right hilum along the medial
margin right middle lobe. Mild linear scarring versus atelectasis at
the lateral right lung base. Mild subsegmental atelectasis at the
base the left lower lobe. No evidence of pneumonia. No pulmonary
edema. No lung mass or suspicious nodule. No pleural effusion or
pneumothorax.

Upper Abdomen: No acute abnormality.

Musculoskeletal: No fracture or acute finding. No osteoblastic or
osteolytic lesions.
IMPRESSION: 1. No acute findings.  No evidence of pneumonia or pulmonary edema.
2. Postsurgical changes on the right from a right upper lobectomy.
3. Mild areas of lung scarring in left lower lobe dependent
subsegmental atelectasis.
4. No evidence of malignancy.

## 2018-11-05 DIAGNOSIS — R9431 Abnormal electrocardiogram [ECG] [EKG]: Secondary | ICD-10-CM | POA: Diagnosis not present

## 2018-11-05 DIAGNOSIS — R0602 Shortness of breath: Secondary | ICD-10-CM | POA: Diagnosis not present

## 2018-11-07 ENCOUNTER — Telehealth: Payer: Self-pay | Admitting: Internal Medicine

## 2018-11-07 ENCOUNTER — Ambulatory Visit (INDEPENDENT_AMBULATORY_CARE_PROVIDER_SITE_OTHER): Payer: Medicare Other | Admitting: Internal Medicine

## 2018-11-07 ENCOUNTER — Other Ambulatory Visit: Payer: Self-pay

## 2018-11-07 DIAGNOSIS — K219 Gastro-esophageal reflux disease without esophagitis: Secondary | ICD-10-CM | POA: Diagnosis not present

## 2018-11-07 DIAGNOSIS — F339 Major depressive disorder, recurrent, unspecified: Secondary | ICD-10-CM

## 2018-11-07 DIAGNOSIS — N183 Chronic kidney disease, stage 3 unspecified: Secondary | ICD-10-CM

## 2018-11-07 DIAGNOSIS — R945 Abnormal results of liver function studies: Secondary | ICD-10-CM

## 2018-11-07 DIAGNOSIS — R0602 Shortness of breath: Secondary | ICD-10-CM

## 2018-11-07 DIAGNOSIS — G20 Parkinson's disease: Secondary | ICD-10-CM

## 2018-11-07 DIAGNOSIS — J449 Chronic obstructive pulmonary disease, unspecified: Secondary | ICD-10-CM

## 2018-11-07 DIAGNOSIS — R739 Hyperglycemia, unspecified: Secondary | ICD-10-CM

## 2018-11-07 DIAGNOSIS — E7800 Pure hypercholesterolemia, unspecified: Secondary | ICD-10-CM

## 2018-11-07 DIAGNOSIS — F419 Anxiety disorder, unspecified: Secondary | ICD-10-CM | POA: Diagnosis not present

## 2018-11-07 DIAGNOSIS — R7989 Other specified abnormal findings of blood chemistry: Secondary | ICD-10-CM

## 2018-11-07 DIAGNOSIS — I10 Essential (primary) hypertension: Secondary | ICD-10-CM

## 2018-11-07 MED ORDER — BUSPIRONE HCL 10 MG PO TABS: 10.0000 mg | ORAL_TABLET | Freq: Every day | ORAL | 1 refills | Status: DC

## 2018-11-07 NOTE — Progress Notes (Signed)
Patient ID: Victoria Hanson, female   DOB: 1950/08/15, 69 y.o.   MRN: [id number]   Virtual Visit via telephone Note  This visit type was conducted due to national recommendations for restrictions regarding the COVID-19 pandemic (e.g. social distancing).  This format is felt to be most appropriate for this patient at this time.  All issues noted in this document were discussed and addressed.  No physical exam was performed (except for noted visual exam findings with Video Visits).   I connected with [name] by telephone and verified that I am speaking with the correct person using two identifiers. Location patient: home Location provider: work Persons participating in the virtual visit: patient, provider  I discussed the limitations, risks, security and privacy concerns of performing an evaluation and management service by telephone and the availability of in person appointments. The patient expressed understanding and agreed to proceed.   Reason for visit: scheduled follow up.   HPI: She is seeing cardiology.  Just had ECHO and stress test.  States she was called yesterday and informed needed f/u testing.  Has appt scheduled in the next few days to discuss further w/up.  Last visit was having left ear fullness and sinus issues.  Treated.  Sinus and ear issues - resolved.  Still noticing some sob with exertion.  Saw cardiology as outlined.  Has f/u planned next week.  Not using advair regularly.  Discussed the need to use advair scheduled.  Uses albuterol prn.  Helps when she uses the albuterol.  No acid reflux.  No abdominal pain.  Bowels moving.  Taking citalopram and buspar.  Feels this is helping with her anxiety.  Not worrying as much.  Taking xanax prn.  Not needing as much now.  Would like to increase buspar to 82m q day.  Feels is helping.  Seeing neurology for f/u of parkinson's.  No jerking movements now.     ROS: See pertinent positives and negatives per HPI.  Past Medical  History:  Diagnosis Date  . Anxiety   . Asthma   . Atypical chest pain    a. 2003 Cath: reportedly nl per pt ([name];  [place]. [facility] [facility].  . [place] cancer ([place]   a. 1999: Pt had partial colectomy. Did develop lung metastasis. Had right upper lobectomy in 01/2007 then had associated chemotherapy.  .Marland KitchenCOPD (chronic obstructive pulmonary disease) ([place]   . DDD (degenerative disc disease), lumbosacral   . Depression   . Echocardiogram abnormal    a. 02/2010 Echo: EF 55-60%, mild LAE, no regional wall motion abnormalities  . Elevated transaminase level    a. ? NASH;  b. 06/2015 - nl LFTs.  . GERD (gastroesophageal reflux disease)   . Hematuria   . Holter monitor, abnormal    a. 12/2009 Holter monitoring: NSR, rare PACs and PVCs.  .Marland KitchenHypercholesterolemia   . Hyperlipidemia   . Hypertension   . Leaky heart valve    a. Per pt report - no evidence of valvular abnormalities on prior echoes.  . Lesion of skin of Right breast 10/20/2017  . [place] ([place] 2008   a. S/P resection (lobectomy - right)  . Morbid obesity ([place]   . Nephrolithiasis   . Parkinson disease ([place] 08/2016   [name]    Past Surgical History:  Procedure Laterality Date  . BREAST EXCISIONAL BIOPSY Right 10/07/2009   benign  . CARDIAC CATHETERIZATION  2003   negative as per pt report  .  CHOLECYSTECTOMY  2000  . COLON SURGERY     Colon cancer, removed part of large colon  . COLONOSCOPY N/A 04/25/2015   Procedure: COLONOSCOPY;  Surgeon: Manya Silvas, MD;  Location: [facility] ENDOSCOPY;  Service: Endoscopy;  Laterality: N/A;  . COLONOSCOPY WITH PROPOFOL N/A 06/14/2017   Procedure: COLONOSCOPY WITH PROPOFOL;  Surgeon: Manya Silvas, MD;  Location: [facility] ENDOSCOPY;  Service: Endoscopy;  Laterality: N/A;  . LOBECTOMY  01/2007   Right lung, upper lobe right side removed  . TOTAL ABDOMINAL HYSTERECTOMY     abnormal bleeding  . TUBAL LIGATION  1980    Family History  Problem Relation  Age of Onset  . Coronary artery disease Mother        died @ 77 of MI.  . Mental illness Mother   . Diabetes Mother   . Heart attack Father 36       MI  . Cancer Father        lung - died in his mid-70's  . Hypertension Father   . Diabetes Father   . Sudden death Brother   . Mental illness Brother   . Heart attack Brother        MI @ 30, died @ age 16.  . Breast cancer Neg Hx     SOCIAL HX: reviewed.    Current Outpatient Medications:  .  ALPRAZolam (XANAX) 0.5 MG tablet, TAKE 1/2 TABLET BY MOUTH DAILY AS NEEDEDFOR ANXIETY, Disp: 30 tablet, Rfl: 0 .  aspirin EC 81 MG tablet, Take 81 mg by mouth daily., Disp: , Rfl:  .  busPIRone (BUSPAR) 10 MG tablet, Take 1 tablet (10 mg total) by mouth daily., Disp: 30 tablet, Rfl: 1 .  carbidopa-levodopa (SINEMET IR) 25-100 MG tablet, Take 2 tablets by mouth 3 (three) times daily. , Disp: , Rfl:  .  citalopram (CELEXA) 10 MG tablet, Take 1 tablet (10 mg total) by mouth daily., Disp: 90 tablet, Rfl: 1 .  fluticasone (FLONASE) 50 MCG/ACT nasal spray, Place 2 sprays into both nostrils daily., Disp: 16 g, Rfl: 6 .  fluticasone-salmeterol (ADVAIR HFA) 115-21 MCG/ACT inhaler, Inhale 2 puffs into the lungs 2 (two) times daily., Disp: 1 Inhaler, Rfl: 12 .  hydrochlorothiazide (HYDRODIURIL) 12.5 MG tablet, Take 1 tablet (12.5 mg total) by mouth daily., Disp: 90 tablet, Rfl: 1 .  Multiple Vitamin (MULTIVITAMIN) tablet, Take 1 tablet by mouth daily., Disp: , Rfl:  .  omeprazole (PRILOSEC) 20 MG capsule, Take 1 capsule (20 mg total) by mouth daily., Disp: 90 capsule, Rfl: 1 .  potassium chloride SA (K-DUR,KLOR-CON) 20 MEQ tablet, Take 1 tablet (20 mEq total) by mouth 2 (two) times daily., Disp: 180 tablet, Rfl: 1 .  traZODone (DESYREL) 50 MG tablet, Take 2 tablets (100 mg total) by mouth at bedtime., Disp: 180 tablet, Rfl: 1 .  VENTOLIN HFA 108 (90 Base) MCG/ACT inhaler, INHALE 2 PUFFS BY MOUTH INTO THE LUNGS EVERY 6 HOURS AS NEEDED FOR WHEEZING, Disp: 18 g,  Rfl: 1 .  vitamin B-12 (CYANOCOBALAMIN) 100 MCG tablet, Take 100 mcg by mouth daily., Disp: , Rfl:   EXAM:  GENERAL: alert.  Sounds to be in no acute distress.  Answering questions appropriately.    PSYCH/NEURO: pleasant and cooperative, no obvious depression or anxiety, speech and thought processing grossly intact  ASSESSMENT AND PLAN:  Discussed the following assessment and plan:  Gastroesophageal reflux disease without esophagitis  Anxiety, mild  Chronic obstructive pulmonary disease, unspecified COPD type (HCC)  CKD (  chronic kidney disease), stage III (HCC)  Depression, recurrent (HCC)  Elevated liver function tests - Plan: Hepatic function panel  Gastroesophageal reflux disease, esophagitis presence not specified  Hypercholesterolemia - Plan: Lipid panel  Hyperglycemia - Plan: Hemoglobin A1c  Essential hypertension - Plan: CBC with Differential/Platelet, TSH, Basic metabolic panel  Parkinson's disease (HCC)  SOB (shortness of breath)  Acid reflux Controlled on current regimen.  Follow.    Anxiety, mild On citalopram and buspar now.  Feels is helping.  Feels needs to increase dose. Will increase buspar to 46m q day.  Follow.    Chronic obstructive pulmonary disease (HCC) Last check PFTs stable.  Some sob with exertion.  On advair. Discussed using advair scheduled.  No cough or congestion.  Follow.   CKD (chronic kidney disease), stage III Avoid antiinflammatories.  Stay hydrated.  Follow renal function.   Depression, recurrent ([place] On citalopram and buspar.  Doing better.  Increase buspar to 183mq day.  Follow.    Elevated liver function tests Diet and exercise.  Weight loss.  Follow liver function tests.    GERD (gastroesophageal reflux disease) Controlled.    Hypercholesterolemia Did not tolerate crestor.  Low cholesterol diet and exercise.  Follow lipid panel.   Hyperglycemia Low carb diet and exercise.  Follow met b and a1c.   Hypertension  Blood pressure has been under good control.  Continue same medication regimen.  Follow pressures.  Follow metabolic panel.    Parkinson's disease ([place] HughesFollowed by neurology.   SOB (shortness of breath) Persistent sob with exertion.  Has seen pulmonary. Note reviewed.  Discussed again regarding the need for her to use advair scheduled.  Cardiac w/up in progress.  Has f/u planned in a few days to discuss further evaluation and w/up.      I discussed the assessment and treatment plan with the patient. The patient was provided an opportunity to ask questions and all were answered. The patient agreed with the plan and demonstrated an understanding of the instructions.   The patient was advised to call back or seek an in-person evaluation if the symptoms worsen or if the condition fails to improve as anticipated.  I provided 20 minutes of non-face-to-face time during this encounter.   ChEinar PheasantMD

## 2018-11-07 NOTE — Telephone Encounter (Signed)
Scheduled for 6/24 @ 11:30

## 2018-11-07 NOTE — Telephone Encounter (Signed)
Dr. Nicki Reaper would like to see this patient in 4 weeks, by phone. Where can she be scheduled?

## 2018-11-09 ENCOUNTER — Encounter: Payer: Self-pay | Admitting: Internal Medicine

## 2018-11-09 NOTE — Assessment & Plan Note (Signed)
Controlled.  

## 2018-11-09 NOTE — Assessment & Plan Note (Signed)
Controlled on current regimen.  Follow.  

## 2018-11-09 NOTE — Assessment & Plan Note (Signed)
Followed by neurology.   

## 2018-11-09 NOTE — Assessment & Plan Note (Signed)
Blood pressure has been under good control.  Continue same medication regimen.  Follow pressures.  Follow metabolic panel.   

## 2018-11-09 NOTE — Assessment & Plan Note (Signed)
Persistent sob with exertion.  Has seen pulmonary. Note reviewed.  Discussed again regarding the need for her to use advair scheduled.  Cardiac w/up in progress.  Has f/u planned in a few days to discuss further evaluation and w/up.

## 2018-11-09 NOTE — Assessment & Plan Note (Signed)
Low carb diet and exercise.  Follow met b and a1c.  

## 2018-11-09 NOTE — Assessment & Plan Note (Signed)
On citalopram and buspar.  Doing better.  Increase buspar to 10mg  q day.  Follow.

## 2018-11-09 NOTE — Assessment & Plan Note (Signed)
On citalopram and buspar now.  Feels is helping.  Feels needs to increase dose. Will increase buspar to 10mg  q day.  Follow.

## 2018-11-09 NOTE — Assessment & Plan Note (Signed)
Diet and exercise.  Weight loss.  Follow liver function tests.

## 2018-11-09 NOTE — Assessment & Plan Note (Signed)
Avoid antiinflammatories.  Stay hydrated.  Follow renal function.

## 2018-11-09 NOTE — Assessment & Plan Note (Signed)
Last check PFTs stable.  Some sob with exertion.  On advair. Discussed using advair scheduled.  No cough or congestion.  Follow.

## 2018-11-09 NOTE — Assessment & Plan Note (Signed)
Did not tolerate crestor.  Low cholesterol diet and exercise.  Follow lipid panel.

## 2018-11-13 ENCOUNTER — Other Ambulatory Visit: Payer: Self-pay

## 2018-11-17 DIAGNOSIS — R0602 Shortness of breath: Secondary | ICD-10-CM | POA: Diagnosis not present

## 2018-11-17 DIAGNOSIS — I10 Essential (primary) hypertension: Secondary | ICD-10-CM | POA: Diagnosis not present

## 2018-11-17 DIAGNOSIS — E7800 Pure hypercholesterolemia, unspecified: Secondary | ICD-10-CM | POA: Diagnosis not present

## 2018-11-17 DIAGNOSIS — M7989 Other specified soft tissue disorders: Secondary | ICD-10-CM | POA: Diagnosis not present

## 2018-11-17 DIAGNOSIS — R9439 Abnormal result of other cardiovascular function study: Secondary | ICD-10-CM | POA: Diagnosis not present

## 2018-11-19 ENCOUNTER — Telehealth: Payer: Self-pay

## 2018-11-19 ENCOUNTER — Ambulatory Visit (INDEPENDENT_AMBULATORY_CARE_PROVIDER_SITE_OTHER): Payer: Medicare Other

## 2018-11-19 ENCOUNTER — Other Ambulatory Visit: Payer: Self-pay

## 2018-11-19 ENCOUNTER — Telehealth: Payer: Self-pay | Admitting: Cardiovascular Disease

## 2018-11-19 ENCOUNTER — Encounter: Payer: Medicare HMO | Admitting: Internal Medicine

## 2018-11-19 DIAGNOSIS — Z0000 Encounter for general adult medical examination without abnormal findings: Secondary | ICD-10-CM

## 2018-11-19 NOTE — Telephone Encounter (Signed)
L MOM to call back to discuss NP appointment, received referreal from [facility] cardiology. Need to verify if patient wants to be seen at [facility]

## 2018-11-19 NOTE — Telephone Encounter (Signed)
Patient returning call Was called to schedule overdue follow up with Dr Fletcher Anon  Patient has been seeing [facility] cardiology and will not need to follow up  Deleting recall

## 2018-11-19 NOTE — Patient Instructions (Addendum)
  Victoria Hanson , Thank you for taking time to come for your Medicare Wellness Visit. I appreciate your ongoing commitment to your health goals. Please review the following plan we discussed and let me know if I can assist you in the future.   These are the goals we discussed: Goals      Patient Stated   . DIET - REDUCE PORTION SIZE (pt-stated)     Reduce the amount of salt intake and products like hotdogs and bologna       This is a list of the screening recommended for you and due dates:  Health Maintenance  Topic Date Due  . Tetanus Vaccine  11/17/1968  . Flu Shot  01/10/2019  . Mammogram  02/07/2019  . Colon Cancer Screening  06/15/2019  . DEXA scan (bone density measurement)  Completed  .  Hepatitis C: One time screening is recommended by Center for Disease Control  (CDC) for  adults born from [birth year] through 1966.   Completed  . Pneumonia vaccines  Completed

## 2018-11-19 NOTE — Progress Notes (Signed)
Subjective:   Victoria Hanson is a 68 y.o. female who presents for Medicare Annual (Subsequent) preventive examination.  Review of Systems:  No ROS.  Medicare Wellness Virtual Visit.  Visual/audio telehealth visit, UTA vital signs.   See social history for additional risk factors.   Cardiac Risk Factors include: advanced age (>60men, >25 women);hypertension     Objective:     Vitals: There were no vitals taken for this visit.  There is no height or weight on file to calculate BMI.  Advanced Directives 11/19/2018 06/21/2018 04/04/2018 03/10/2018 11/07/2017 10/15/2017 05/29/2017  Does Patient Have a Medical Advance Directive? Yes No No Yes Yes Yes Yes  Type of Advance Directive Healthcare Power of [place]  Does patient want to make changes to medical advance directive? No - Patient declined - - - - - No - Patient declined  Copy of [place] in Chart? Yes - validated most recent copy scanned in chart (See row information) - - - No - copy requested - Yes  Would patient like information on creating a medical advance directive? - No - Patient declined No - Patient declined - No - Patient declined - -    Tobacco Social History   Tobacco Use  Smoking Status Passive Smoke Exposure - Never Smoker  Smokeless Tobacco Never Used  Tobacco Comment   husband and son smoked in her home.     Counseling given: Not Answered Comment: husband and son smoked in her home.   Clinical Intake:  Pre-visit preparation completed: Yes        Diabetes: No  How often do you need to have someone help you when you read instructions, pamphlets, or other written materials from your doctor or pharmacy?: 1 - Never  Interpreter Needed?: No     Past Medical History:  Diagnosis Date  . Anxiety   . Asthma   . Atypical chest pain    a. 2003 Cath: reportedly nl per pt [name]);  b. 04/2015 [place] Admission, neg  troponin.  . Colon cancer ([place]) 1999   a. 1999: Pt had partial colectomy. Did develop lung metastasis. Had right upper lobectomy in 01/2007 then had associated chemotherapy.  Marland Kitchen COPD (chronic obstructive pulmonary disease) ([place])   . DDD (degenerative disc disease), lumbosacral   . Depression   . Echocardiogram abnormal    a. 02/2010 Echo: EF 55-60%, mild LAE, no regional wall motion abnormalities  . Elevated transaminase level    a. ? NASH;  b. 06/2015 - nl LFTs.  . GERD (gastroesophageal reflux disease)   . Hematuria   . Holter monitor, abnormal    a. 12/2009 Holter monitoring: NSR, rare PACs and PVCs.  Marland Kitchen Hypercholesterolemia   . Hyperlipidemia   . Hypertension   . Leaky heart valve    a. Per pt report - no evidence of valvular abnormalities on prior echoes.  . Lesion of skin of Right breast 10/20/2017  . Lung metastases ([place]) 2008   a. S/P resection (lobectomy - right)  . Morbid obesity ([place])   . Nephrolithiasis   . Parkinson disease ([place]) 08/2016   [name]   Past Surgical History:  Procedure Laterality Date  . BREAST EXCISIONAL BIOPSY Right 10/07/2009   benign  . CARDIAC CATHETERIZATION  2003   negative as per pt report  . CHOLECYSTECTOMY  2000  . COLON SURGERY     Colon cancer, removed part of large colon  .  COLONOSCOPY N/A 04/25/2015   Procedure: COLONOSCOPY;  Surgeon: Manya Silvas, MD;  Location: [facility] ENDOSCOPY;  Service: Endoscopy;  Laterality: N/A;  . COLONOSCOPY WITH PROPOFOL N/A 06/14/2017   Procedure: COLONOSCOPY WITH PROPOFOL;  Surgeon: Manya Silvas, MD;  Location[facility] ENDOSCOPY;  Service: Endoscopy;  Laterality: N/A;  . LOBECTOMY  01/2007   Right lung, upper lobe right side removed  . TOTAL ABDOMINAL HYSTERECTOMY     abnormal bleeding  . TUBAL LIGATION  1980   Family History  Problem Relation Age of Onset  . Coronary artery disease Mother        died @ 103 of MI.  . Mental illness Mother   . Diabetes Mother   . Heart attack Father 88       MI  .  Cancer Father        lung - died in his mid-70's  . Hypertension Father   . Diabetes Father   . Sudden death Brother   . Mental illness Brother   . Heart attack Brother        MI @ 2, died @ age 25.  . Breast cancer Neg Hx    Social History   Socioeconomic History  . Marital status: Widowed    Spouse name: Not on file  . Number of children: Not on file  . Years of education: Not on file  . Highest education level: Not on file  Occupational History  . Not on file  Social Needs  . Financial resource strain: Not hard at all  . Food insecurity:    Worry: Never true    Inability: Never true  . Transportation needs:    Medical: No    Non-medical: No  Tobacco Use  . Smoking status: Passive Smoke Exposure - Never Smoker  . Smokeless tobacco: Never Used  . Tobacco comment: husband and son smoked in her home.  Substance and Sexual Activity  . Alcohol use: Yes    Alcohol/week: 1.0 standard drinks    Types: 1 Glasses of wine per week    Comment: rare wine cooler.  . Drug use: No  . Sexual activity: Never  Lifestyle  . Physical activity:    Days per week: Not on file    Minutes per session: Not on file  . Stress: Not on file  Relationships  . Social connections:    Talks on phone: Not on file    Gets together: Not on file    Attends religious service: Not on file    Active member of club or organization: Not on file    Attends meetings of clubs or organizations: Not on file    Relationship status: Not on file  Other Topics Concern  . Not on file  Social History Narrative   Widowed.   Disabled (colon/lung CA), retired.   Lives in [place] with her dtr, grand-dtr, and son.   Active.   Gets regular exercise, walks.    Outpatient Encounter Medications as of 11/19/2018  Medication Sig  . ALPRAZolam (XANAX) 0.5 MG tablet TAKE 1/2 TABLET BY MOUTH DAILY AS NEEDEDFOR ANXIETY  . aspirin EC 81 MG tablet Take 81 mg by mouth daily.  . busPIRone (BUSPAR) 10 MG tablet Take  1 tablet (10 mg total) by mouth daily.  . carbidopa-levodopa (SINEMET IR) 25-100 MG tablet Take 2 tablets by mouth 3 (three) times daily.   . citalopram (CELEXA) 10 MG tablet Take 1 tablet (10 mg total) by mouth daily.  Marland Kitchen  fluticasone (FLONASE) 50 MCG/ACT nasal spray Place 2 sprays into both nostrils daily.  . fluticasone-salmeterol (ADVAIR HFA) 115-21 MCG/ACT inhaler Inhale 2 puffs into the lungs 2 (two) times daily.  . hydrochlorothiazide (HYDRODIURIL) 12.5 MG tablet Take 1 tablet (12.5 mg total) by mouth daily.  . Multiple Vitamin (MULTIVITAMIN) tablet Take 1 tablet by mouth daily.  Marland Kitchen omeprazole (PRILOSEC) 20 MG capsule Take 1 capsule (20 mg total) by mouth daily.  . potassium chloride SA (K-DUR,KLOR-CON) 20 MEQ tablet Take 1 tablet (20 mEq total) by mouth 2 (two) times daily.  . traZODone (DESYREL) 50 MG tablet Take 2 tablets (100 mg total) by mouth at bedtime.  . VENTOLIN HFA 108 (90 Base) MCG/ACT inhaler INHALE 2 PUFFS BY MOUTH INTO THE LUNGS EVERY 6 HOURS AS NEEDED FOR WHEEZING  . vitamin B-12 (CYANOCOBALAMIN) 100 MCG tablet Take 100 mcg by mouth daily.   No facility-administered encounter medications on file as of 11/19/2018.     Activities of Daily Living In your present state of health, do you have any difficulty performing the following activities: 11/19/2018  Hearing? N  Vision? N  Difficulty concentrating or making decisions? N  Walking or climbing stairs? Y  Comment She paces herself when climbimg stairs and walking long distances  Dressing or bathing? N  Doing errands, shopping? N  Preparing Food and eating ? N  Using the Toilet? N  In the past six months, have you accidently leaked urine? N  Do you have problems with loss of bowel control? N  Managing your Medications? N  Managing your Finances? N  Housekeeping or managing your Housekeeping? N  Some recent data might be hidden    Patient Care Team: Einar Pheasant, MD as PCP - General (Unknown Physician Specialty)  Manya Silvas, MD (Gastroenterology)    Assessment:   This is a routine wellness examination for Danyla.  I connected with patient 11/19/18 at  9:30 AM EDT by a video/audio enabled telemedicine application and verified that I am speaking with the correct person using two identifiers. Patient stated full name and DOB. Patient gave permission to continue with virtual visit. Patient's location was at home and Nurse's location was at [place] office.   Return for labs 6/12.  Health Screenings  Mammogram - 01/2018 Colonoscopy - 06/2017 Bone Density - 11/2015 Glaucoma -none Hearing -demonstrates normal hearing during visit. Labs followed by pcp Dental- [facility]- visits within the last 12 months.  Social  Alcohol intake - yes      Smoking history- never    Smokers in home? none current Illicit drug use? none Physical activity- active around the home Diet - regular Sexually Active -never BMI- discussed the importance of a healthy diet, water intake and the benefits of aerobic exercise.  Educational material provided.   Safety  Patient feels safe at home- yes. Son, daughter and granddaughter lives with her. Patient does have smoke detectors at home- yes Patient does wear sunscreen or protective clothing when in direct sunlight -yes Patient does wear seat belt when in a moving vehicle -yes Patient drives- yes  [id number]-51 precautions and sickness symptoms discussed.   Activities of Daily Living Patient denies needing assistance with: driving, household chores, feeding themselves, getting from bed to chair, getting to the toilet, bathing/showering, dressing, managing money, or preparing meals.  No new identified risk were noted.    Depression Screen Patient denies losing interest in daily life, feeling hopeless, or crying easily over simple problems.   Medication-taking as directed  and without issues.   Fall Screen Patient denies being afraid of falling or falling in the last  year.   Memory Screen Patient is alert.  Patient denies difficulty focusing, concentrating or misplacing items. Correctly identified the president of the [place], season and recall. Patient likes to read and complete puzzles for brain stimulation.  Immunizations The following Immunizations were discussed: Influenza, shingles, pneumonia, and tetanus.   Other Providers Patient Care Team: Einar Pheasant, MD as PCP - General (Unknown Physician Specialty) Manya Silvas, MD (Gastroenterology)  Exercise Activities and Dietary ecommendations    Goals      Patient Stated   . DIET - REDUCE PORTION SIZE (pt-stated)     Reduce the amount of salt intake and products like hotdogs and bologna       Fall Risk Fall Risk  11/19/2018 10/29/2017 05/29/2017 06/01/2016 10/11/2015  Falls in the past year? 0 Yes No No No  Number falls in past yr: - 1 - - -  Injury with Fall? - No - - -   Depression Screen PHQ 2/9 Scores 11/19/2018 10/29/2017 05/29/2017 06/01/2016  PHQ - 2 Score 0 0 0 0  PHQ- 9 Score - - - -     Cognitive Function     6CIT Screen 11/19/2018 05/29/2017  What Year? 0 points 0 points  What month? 0 points 0 points  What time? 0 points 0 points  Count back from 20 0 points 0 points  Months in reverse 0 points 0 points  Repeat phrase 0 points 0 points  Total Score 0 0    Immunization History  Administered Date(s) Administered  . Influenza, High Dose Seasonal PF 04/17/2017, 04/16/2018  . Influenza,inj,Quad PF,6+ Mos 04/02/2013, 04/07/2014, 03/15/2015, 02/03/2016  . Pneumococcal Conjugate-13 01/11/2015  . Pneumococcal Polysaccharide-23 01/14/2017   Screening Tests Health Maintenance  Topic Date Due  . TETANUS/TDAP  11/17/1968  . INFLUENZA VACCINE  01/10/2019  . MAMMOGRAM  02/07/2019  . COLONOSCOPY  06/15/2019  . DEXA SCAN  Completed  . Hepatitis C Screening  Completed  . PNA vac Low Risk Adult  Completed      Plan:    End of life planning; Advance aging; Advanced  directives discussed.  Copy of current HCPOA/Living Will on file.    I have personally reviewed and noted the following in the patient's chart:   . Medical and social history . Use of alcohol, tobacco or illicit drugs  . Current medications and supplements . Functional ability and status . Nutritional status . Physical activity . Advanced directives . List of other physicians . Hospitalizations, surgeries, and ER visits in previous 12 months . Vitals . Screenings to include cognitive, depression, and falls . Referrals and appointments  In addition, I have reviewed and discussed with patient certain preventive protocols, quality metrics, and best practice recommendations. A written personalized care plan for preventive services as well as general preventive health recommendations were provided to patient.     Varney Biles, LPN  02/24/568   Reviewed above information.  Agree with assessment and plan.    Dr Nicki Reaper

## 2018-11-21 ENCOUNTER — Other Ambulatory Visit
Admission: RE | Admit: 2018-11-21 | Discharge: 2018-11-21 | Disposition: A | Discharge status: Home / Self Care | Payer: Medicare Other | Source: Ambulatory Visit | Attending: Pulmonary Disease | Admitting: Pulmonary Disease

## 2018-11-21 ENCOUNTER — Other Ambulatory Visit: Payer: Self-pay

## 2018-11-21 ENCOUNTER — Other Ambulatory Visit (INDEPENDENT_AMBULATORY_CARE_PROVIDER_SITE_OTHER): Payer: Medicare Other

## 2018-11-21 DIAGNOSIS — Z87440 Personal history of urinary (tract) infections: Secondary | ICD-10-CM | POA: Diagnosis not present

## 2018-11-21 DIAGNOSIS — R7989 Other specified abnormal findings of blood chemistry: Secondary | ICD-10-CM

## 2018-11-21 DIAGNOSIS — R945 Abnormal results of liver function studies: Secondary | ICD-10-CM

## 2018-11-21 DIAGNOSIS — E7800 Pure hypercholesterolemia, unspecified: Secondary | ICD-10-CM | POA: Diagnosis not present

## 2018-11-21 DIAGNOSIS — Z01812 Encounter for preprocedural laboratory examination: Secondary | ICD-10-CM | POA: Insufficient documentation

## 2018-11-21 DIAGNOSIS — R739 Hyperglycemia, unspecified: Secondary | ICD-10-CM | POA: Diagnosis not present

## 2018-11-21 DIAGNOSIS — Z1159 Encounter for screening for other viral diseases: Secondary | ICD-10-CM | POA: Diagnosis not present

## 2018-11-21 DIAGNOSIS — I10 Essential (primary) hypertension: Secondary | ICD-10-CM

## 2018-11-21 LAB — HEPATIC FUNCTION PANEL
ALT: 3 U/L (ref 0–35)
AST: 15 U/L (ref 0–37)
Albumin: 3.7 g/dL (ref 3.5–5.2)
Alkaline Phosphatase: 77 U/L (ref 39–117)
Bilirubin, Direct: 0.1 mg/dL (ref 0.0–0.3)
Total Bilirubin: 0.4 mg/dL (ref 0.2–1.2)
Total Protein: 6.2 g/dL (ref 6.0–8.3)

## 2018-11-21 LAB — URINALYSIS, ROUTINE W REFLEX MICROSCOPIC
Bilirubin Urine: NEGATIVE
Hgb urine dipstick: NEGATIVE
Ketones, ur: NEGATIVE
Nitrite: NEGATIVE
RBC / HPF: NONE SEEN (ref 0–?)
Specific Gravity, Urine: 1.01 (ref 1.000–1.030)
Total Protein, Urine: NEGATIVE
Urine Glucose: NEGATIVE
Urobilinogen, UA: 0.2 (ref 0.0–1.0)
pH: 7 (ref 5.0–8.0)

## 2018-11-21 LAB — CBC WITH DIFFERENTIAL/PLATELET
Basophils Absolute: 0.1 10*3/uL (ref 0.0–0.1)
Basophils Relative: 1.1 % (ref 0.0–3.0)
Eosinophils Absolute: 0.1 10*3/uL (ref 0.0–0.7)
Eosinophils Relative: 1.5 % (ref 0.0–5.0)
HCT: 35 % — ABNORMAL LOW (ref 36.0–46.0)
Hemoglobin: 11.5 g/dL — ABNORMAL LOW (ref 12.0–15.0)
Lymphocytes Relative: 21 % (ref 12.0–46.0)
Lymphs Abs: 1.5 10*3/uL (ref 0.7–4.0)
MCHC: 33 g/dL (ref 30.0–36.0)
MCV: 86.8 fl (ref 78.0–100.0)
Monocytes Absolute: 0.5 10*3/uL (ref 0.1–1.0)
Monocytes Relative: 6.6 % (ref 3.0–12.0)
Neutro Abs: 5.1 10*3/uL (ref 1.4–7.7)
Neutrophils Relative %: 69.8 % (ref 43.0–77.0)
Platelets: 250 10*3/uL (ref 150.0–400.0)
RBC: 4.03 Mil/uL (ref 3.87–5.11)
RDW: 13.6 % (ref 11.5–15.5)
WBC: 7.3 10*3/uL (ref 4.0–10.5)

## 2018-11-21 LAB — LIPID PANEL
Cholesterol: 214 mg/dL — ABNORMAL HIGH (ref 0–200)
HDL: 53.5 mg/dL (ref >39.00)
LDL Cholesterol: 132 mg/dL — ABNORMAL HIGH (ref 0–99)
NonHDL: 160.16
Total CHOL/HDL Ratio: 4
Triglycerides: 143 mg/dL (ref 0.0–149.0)
VLDL: 28.6 mg/dL (ref 0.0–40.0)

## 2018-11-21 LAB — BASIC METABOLIC PANEL
BUN: 17 mg/dL (ref 6–23)
CO2: 27 mEq/L (ref 19–32)
Calcium: 9.1 mg/dL (ref 8.4–10.5)
Chloride: 102 mEq/L (ref 96–112)
Creatinine, Ser: 1.2 mg/dL (ref 0.40–1.20)
GFR: 44.54 mL/min — ABNORMAL LOW (ref >60.00)
Glucose, Bld: 102 mg/dL — ABNORMAL HIGH (ref 70–99)
Potassium: 4.2 mEq/L (ref 3.5–5.1)
Sodium: 137 mEq/L (ref 135–145)

## 2018-11-21 LAB — TSH: TSH: 2.05 u[IU]/mL (ref 0.35–4.50)

## 2018-11-21 LAB — HEMOGLOBIN A1C: Hgb A1c MFr Bld: 6.2 % (ref 4.6–6.5)

## 2018-11-22 ENCOUNTER — Other Ambulatory Visit: Payer: Self-pay | Admitting: Internal Medicine

## 2018-11-22 DIAGNOSIS — D649 Anemia, unspecified: Secondary | ICD-10-CM

## 2018-11-22 LAB — URINE CULTURE
MICRO NUMBER:: 564345
SPECIMEN QUALITY:: ADEQUATE

## 2018-11-22 LAB — NOVEL CORONAVIRUS, NAA (HOSP ORDER, SEND-OUT TO REF LAB; TAT 18-24 HRS): SARS-CoV-2, NAA: NOT DETECTED

## 2018-11-22 NOTE — Progress Notes (Signed)
Order placed for f/u labs.  

## 2018-11-24 ENCOUNTER — Other Ambulatory Visit: Payer: Self-pay | Admitting: Internal Medicine

## 2018-11-25 ENCOUNTER — Other Ambulatory Visit: Payer: Self-pay

## 2018-11-25 ENCOUNTER — Ambulatory Visit: Payer: Medicare Other | Attending: Pulmonary Disease

## 2018-11-25 ENCOUNTER — Ambulatory Visit: Payer: Medicare HMO

## 2018-11-25 DIAGNOSIS — J449 Chronic obstructive pulmonary disease, unspecified: Secondary | ICD-10-CM | POA: Insufficient documentation

## 2018-11-25 DIAGNOSIS — R9439 Abnormal result of other cardiovascular function study: Secondary | ICD-10-CM

## 2018-11-25 MED ORDER — PRAVASTATIN SODIUM 10 MG PO TABS: 10.0000 mg | ORAL_TABLET | Freq: Every day | ORAL | 0 refills | Status: DC

## 2018-11-25 NOTE — Telephone Encounter (Signed)
Refilled: 09/26/2018 Last OV: 11/07/2018 Next OV: 12/03/2018

## 2018-11-25 NOTE — Progress Notes (Unsigned)
Please send results to [facility] Attn: Doristine Mango, [place]

## 2018-11-26 ENCOUNTER — Telehealth (HOSPITAL_COMMUNITY): Payer: Self-pay | Admitting: Emergency Medicine

## 2018-11-26 NOTE — Telephone Encounter (Signed)
Left message on voicemail with name and callback number Arben Packman RN Navigator Cardiac Imaging [facility] [phone] Office [phone] Cell  

## 2018-11-26 NOTE — Telephone Encounter (Signed)
rx ok'd for xanax #30 with no refills.

## 2018-11-27 ENCOUNTER — Other Ambulatory Visit: Payer: Self-pay

## 2018-11-27 ENCOUNTER — Ambulatory Visit
Admission: RE | Admit: 2018-11-27 | Discharge: 2018-11-27 | Disposition: A | Discharge status: Home / Self Care | Payer: Medicare Other | Source: Ambulatory Visit | Attending: Cardiology | Admitting: Cardiology

## 2018-11-27 DIAGNOSIS — R9430 Abnormal result of cardiovascular function study, unspecified: Secondary | ICD-10-CM | POA: Diagnosis not present

## 2018-11-27 DIAGNOSIS — R9439 Abnormal result of other cardiovascular function study: Secondary | ICD-10-CM | POA: Diagnosis not present

## 2018-11-27 DIAGNOSIS — R079 Chest pain, unspecified: Secondary | ICD-10-CM | POA: Insufficient documentation

## 2018-11-27 DIAGNOSIS — E785 Hyperlipidemia, unspecified: Secondary | ICD-10-CM | POA: Insufficient documentation

## 2018-11-27 DIAGNOSIS — I700 Atherosclerosis of aorta: Secondary | ICD-10-CM | POA: Diagnosis not present

## 2018-11-27 LAB — POCT I-STAT CREATININE: Creatinine, Ser: 1.2 mg/dL — ABNORMAL HIGH (ref 0.44–1.00)

## 2018-11-27 MED ORDER — METOPROLOL TARTRATE 5 MG/5ML IV SOLN
5.0000 mg | INTRAVENOUS | Status: DC | PRN
  Administered 2018-11-27 (×2): 5 mg via INTRAVENOUS

## 2018-11-27 MED ORDER — IOHEXOL 350 MG/ML SOLN
100.0000 mL | Freq: Once | INTRAVENOUS | Status: AC | PRN
  Administered 2018-11-27: 100 mL via INTRAVENOUS

## 2018-11-27 MED ORDER — SODIUM CHLORIDE 0.9 % IV BOLUS
500.0000 mL | Freq: Once | INTRAVENOUS | Status: AC
  Administered 2018-11-27: 500 mL via INTRAVENOUS

## 2018-11-27 MED ORDER — NITROGLYCERIN 0.4 MG SL SUBL
0.8000 mg | SUBLINGUAL_TABLET | SUBLINGUAL | Status: DC | PRN
  Administered 2018-11-27: 0.8 mg via SUBLINGUAL

## 2018-12-01 NOTE — Telephone Encounter (Signed)
Patient has upcoming appt with dr. Ubaldo Glassing   Attempted to speak with patient about referral   Future appt linked to referral   Closing encounter

## 2018-12-03 ENCOUNTER — Encounter: Payer: Self-pay | Admitting: Internal Medicine

## 2018-12-03 ENCOUNTER — Other Ambulatory Visit: Payer: Self-pay

## 2018-12-03 ENCOUNTER — Ambulatory Visit (INDEPENDENT_AMBULATORY_CARE_PROVIDER_SITE_OTHER): Payer: Medicare Other | Admitting: Internal Medicine

## 2018-12-03 DIAGNOSIS — J449 Chronic obstructive pulmonary disease, unspecified: Secondary | ICD-10-CM

## 2018-12-03 DIAGNOSIS — R2681 Unsteadiness on feet: Secondary | ICD-10-CM

## 2018-12-03 DIAGNOSIS — G20 Parkinson's disease: Secondary | ICD-10-CM | POA: Diagnosis not present

## 2018-12-03 DIAGNOSIS — R519 Headache, unspecified: Secondary | ICD-10-CM

## 2018-12-03 DIAGNOSIS — I10 Essential (primary) hypertension: Secondary | ICD-10-CM

## 2018-12-03 DIAGNOSIS — E7800 Pure hypercholesterolemia, unspecified: Secondary | ICD-10-CM | POA: Diagnosis not present

## 2018-12-03 DIAGNOSIS — R739 Hyperglycemia, unspecified: Secondary | ICD-10-CM

## 2018-12-03 DIAGNOSIS — M7989 Other specified soft tissue disorders: Secondary | ICD-10-CM | POA: Diagnosis not present

## 2018-12-03 DIAGNOSIS — N183 Chronic kidney disease, stage 3 unspecified: Secondary | ICD-10-CM

## 2018-12-03 DIAGNOSIS — R51 Headache: Secondary | ICD-10-CM

## 2018-12-03 DIAGNOSIS — R0602 Shortness of breath: Secondary | ICD-10-CM | POA: Diagnosis not present

## 2018-12-03 DIAGNOSIS — F329 Major depressive disorder, single episode, unspecified: Secondary | ICD-10-CM

## 2018-12-03 DIAGNOSIS — K219 Gastro-esophageal reflux disease without esophagitis: Secondary | ICD-10-CM

## 2018-12-03 NOTE — Assessment & Plan Note (Signed)
Seeing cardiology.  W/up as outlined.  Have discussed using inhaler regularly.  Continue f/u with pulmonary.

## 2018-12-03 NOTE — Assessment & Plan Note (Signed)
Low carb diet and exercise.  Follow met b and a1c.  

## 2018-12-03 NOTE — Assessment & Plan Note (Signed)
Intermittent.  Localized over left side head.  Check esr.  Some unsteadiness.  Discussed therapy.  She declines at this time.  Request referral back to neurology.

## 2018-12-03 NOTE — Assessment & Plan Note (Signed)
Saw cardiology.  W/up as outlined.  Instructed to take lasix prn.  No increased swelling now.  Follow.

## 2018-12-03 NOTE — Assessment & Plan Note (Signed)
Some unsteadiness as outlined.  Discussed physical therapy.  She declines at this time.  Discussed using a cane/walker.

## 2018-12-03 NOTE — Assessment & Plan Note (Signed)
Low cholesterol diet and exercise.  Follow lipid panel.   

## 2018-12-03 NOTE — Assessment & Plan Note (Signed)
No upper symptoms reported.   

## 2018-12-03 NOTE — Assessment & Plan Note (Signed)
Reports blood pressure doing well.  Follow pressures.  Follow metabolic panel.

## 2018-12-03 NOTE — Assessment & Plan Note (Signed)
Stay hydrated.  Avoid antiinflammatories.  Follow metabolic panel.

## 2018-12-03 NOTE — Progress Notes (Signed)
Patient ID: Victoria Hanson, female   DOB: 03/31/1951, 68 y.o.   MRN: [id number]   Virtual Visit via telephone Note  This visit type was conducted due to national recommendations for restrictions regarding the COVID-19 pandemic (e.g. social distancing).  This format is felt to be most appropriate for this patient at this time.  All issues noted in this document were discussed and addressed.  No physical exam was performed (except for noted visual exam findings with Video Visits).   I connected with Victoria Hanson by telephone and verified that I am speaking with the correct person using two identifiers. Location patient: home Location provider: work  Persons participating in the telephone visit: patient, provider  I discussed the limitations, risks, security and privacy concerns of performing an evaluation and management service by telephone and the availability of in person appointments.  The patient expressed understanding and agreed to proceed.   Reason for visit: scheduled follow up.   HPI: She has been evaluated by cardiology.  Stress test - borderline inf hypoperfusion, EF 55%, mild LVH, mild MR/TR.  Is s/p cardiac CT.  Pt reports - ok.  Instructed to take lasix prn.  Lower extremity swelling improved.  Notices some sob.  Has COPD.  Has seen pulmonary.  Have discussed using inhaler.  No fever.  No cough or chest congestion.  No acid reflux.  No abdominal pain.  Bowels moving.  Taking hctz.  Creatinine stable - 1.2.  Discussed the importance of staying hydrated.  Taking buspar and citalopram.  Appears to be stable.  Seeing neurology - f/u parkinsons.  Reports some unsteady gait.  Discussed therapy.  Discussed using support (cane/walker).  She declines therapy at this time.  No recent falls.  Also reports left side head pain.  Persistent intermittent pain - over the last month.  Discussed further w/up including esr,etc. She agreed to labs and desires no further w/up at this time.  Does request  earlier appt with neurology.     ROS: See pertinent positives and negatives per HPI.  Past Medical History:  Diagnosis Date  . Anxiety   . Asthma   . Atypical chest pain    a. 2003 Cath: reportedly nl per pt Victoria Hanson);  b. 04/2015 [place] Admission, neg troponin.  . Colon cancer ([place]) 1999   a. 1999: Pt had partial colectomy. Did develop lung metastasis. Had right upper lobectomy in 01/2007 then had associated chemotherapy.  Marland Kitchen COPD (chronic obstructive pulmonary disease) ([place])   . DDD (degenerative disc disease), lumbosacral   . Depression   . Echocardiogram abnormal    a. 02/2010 Echo: EF 55-60%, mild LAE, no regional wall motion abnormalities  . Elevated transaminase level    a. ? NASH;  b. 06/2015 - nl LFTs.  . GERD (gastroesophageal reflux disease)   . Hematuria   . Holter monitor, abnormal    a. 12/2009 Holter monitoring: NSR, rare PACs and PVCs.  Marland Kitchen Hypercholesterolemia   . Hyperlipidemia   . Hypertension   . Leaky heart valve    a. Per pt report - no evidence of valvular abnormalities on prior echoes.  . Lesion of skin of Right breast 10/20/2017  . Lung metastases ([place]) 2008   a. S/P resection (lobectomy - right)  . Morbid obesity ([place])   . Nephrolithiasis   . Parkinson disease ([place]) 08/2016   [name]    Past Surgical History:  Procedure Laterality Date  . BREAST EXCISIONAL BIOPSY Right 10/07/2009   benign  . CARDIAC  CATHETERIZATION  2003   negative as per pt report  . CHOLECYSTECTOMY  2000  . COLON SURGERY     Colon cancer, removed part of large colon  . COLONOSCOPY N/A 04/25/2015   Procedure: COLONOSCOPY;  Surgeon: Manya Silvas, MD;  [facility] ENDOSCOPY;  Service: Endoscopy;  Laterality: N/A;  . COLONOSCOPY WITH PROPOFOL N/A 06/14/2017   Procedure: COLONOSCOPY WITH PROPOFOL;  Surgeon: Manya Silvas, MD;  Location[facility] ENDOSCOPY;  Service: Endoscopy;  Laterality: N/A;  . LOBECTOMY  01/2007   Right lung, upper lobe right side removed  . TOTAL ABDOMINAL  HYSTERECTOMY     abnormal bleeding  . TUBAL LIGATION  1980    Family History  Problem Relation Age of Onset  . Coronary artery disease Mother        died @ 80 of MI.  . Mental illness Mother   . Diabetes Mother   . Heart attack Father 31       MI  . Cancer Father        lung - died in his mid-70's  . Hypertension Father   . Diabetes Father   . Sudden death Brother   . Mental illness Brother   . Heart attack Brother        MI @ 69, died @ age 70.  . Breast cancer Neg Hx     SOCIAL HX: reviewed.    Current Outpatient Medications:  .  ALPRAZolam (XANAX) 0.5 MG tablet, TAKE 1/2 TABLET BY MOUTH DAILY AS NEEDEDFOR ANXIETY, Disp: 30 tablet, Rfl: 0 .  aspirin EC 81 MG tablet, Take 81 mg by mouth daily., Disp: , Rfl:  .  busPIRone (BUSPAR) 10 MG tablet, Take 1 tablet (10 mg total) by mouth daily., Disp: 30 tablet, Rfl: 1 .  carbidopa-levodopa (SINEMET IR) 25-100 MG tablet, Take 2 tablets by mouth 3 (three) times daily. , Disp: , Rfl:  .  citalopram (CELEXA) 10 MG tablet, Take 1 tablet (10 mg total) by mouth daily., Disp: 90 tablet, Rfl: 1 .  fluticasone (FLONASE) 50 MCG/ACT nasal spray, Place 2 sprays into both nostrils daily., Disp: 16 g, Rfl: 6 .  fluticasone-salmeterol (ADVAIR HFA) 115-21 MCG/ACT inhaler, Inhale 2 puffs into the lungs 2 (two) times daily., Disp: 1 Inhaler, Rfl: 12 .  hydrochlorothiazide (HYDRODIURIL) 12.5 MG tablet, Take 1 tablet (12.5 mg total) by mouth daily., Disp: 90 tablet, Rfl: 1 .  Multiple Vitamin (MULTIVITAMIN) tablet, Take 1 tablet by mouth daily., Disp: , Rfl:  .  omeprazole (PRILOSEC) 20 MG capsule, Take 1 capsule (20 mg total) by mouth daily., Disp: 90 capsule, Rfl: 1 .  potassium chloride SA (K-DUR,KLOR-CON) 20 MEQ tablet, Take 1 tablet (20 mEq total) by mouth 2 (two) times daily., Disp: 180 tablet, Rfl: 1 .  pravastatin (PRAVACHOL) 10 MG tablet, Take 1 tablet (10 mg total) by mouth daily., Disp: 90 tablet, Rfl: 0 .  traZODone (DESYREL) 50 MG tablet,  Take 2 tablets (100 mg total) by mouth at bedtime., Disp: 180 tablet, Rfl: 1 .  VENTOLIN HFA 108 (90 Base) MCG/ACT inhaler, INHALE 2 PUFFS BY MOUTH INTO THE LUNGS EVERY 6 HOURS AS NEEDED FOR WHEEZING, Disp: 18 g, Rfl: 1 .  vitamin B-12 (CYANOCOBALAMIN) 100 MCG tablet, Take 100 mcg by mouth daily., Disp: , Rfl:   EXAM:  GENERAL: alert.  Answering questions appropriately.  Sounds to be in no acute distress.    PSYCH/NEURO: pleasant and cooperative, no obvious depression or anxiety, speech and thought processing  grossly intact  ASSESSMENT AND PLAN:  Discussed the following assessment and plan:  Swelling of lower extremity Saw cardiology.  W/up as outlined.  Instructed to take lasix prn.  No increased swelling now.  Follow.    SOB (shortness of breath) Seeing cardiology.  W/up as outlined.  Have discussed using inhaler regularly.  Continue f/u with pulmonary.    Pure hypercholesterolemia Low cholesterol diet and exercise.  Follow lipid panel.    Parkinson's disease ([place]) Followed by neurology.    Major depressive disorder with single episode On citalopram and buspar.    Hypertension Reports blood pressure doing well.  Follow pressures.  Follow metabolic panel.   Hyperglycemia Low carb diet and exercise.  Follow met b and a1c.   GERD (gastroesophageal reflux disease) No upper symptoms reported.    CKD (chronic kidney disease), stage III Stay hydrated.  Avoid antiinflammatories.  Follow metabolic panel.    Chronic obstructive pulmonary disease (HCC) Have discussed using inhaler regularly.  Continue f/u with pulmonary.    Head pain Intermittent.  Localized over left side head.  Check esr.  Some unsteadiness.  Discussed therapy.  She declines at this time.  Request referral back to neurology.    Unsteady gait Some unsteadiness as outlined.  Discussed physical therapy.  She declines at this time.  Discussed using a cane/walker.      I discussed the assessment and treatment  plan with the patient. The patient was provided an opportunity to ask questions and all were answered. The patient agreed with the plan and demonstrated an understanding of the instructions.   The patient was advised to call back or seek an in-person evaluation if the symptoms worsen or if the condition fails to improve as anticipated.  I provided 28 minutes of non-face-to-face time during this encounter.   Einar Pheasant, MD

## 2018-12-03 NOTE — Assessment & Plan Note (Signed)
Have discussed using inhaler regularly.  Continue f/u with pulmonary.

## 2018-12-03 NOTE — Assessment & Plan Note (Signed)
Followed by neurology.   

## 2018-12-03 NOTE — Assessment & Plan Note (Signed)
On citalopram and buspar.

## 2018-12-04 ENCOUNTER — Other Ambulatory Visit: Payer: Self-pay

## 2018-12-04 ENCOUNTER — Other Ambulatory Visit (INDEPENDENT_AMBULATORY_CARE_PROVIDER_SITE_OTHER): Payer: Medicare Other

## 2018-12-04 DIAGNOSIS — D649 Anemia, unspecified: Secondary | ICD-10-CM | POA: Diagnosis not present

## 2018-12-04 DIAGNOSIS — R51 Headache: Secondary | ICD-10-CM

## 2018-12-04 DIAGNOSIS — R519 Headache, unspecified: Secondary | ICD-10-CM

## 2018-12-04 LAB — CBC WITH DIFFERENTIAL/PLATELET
Basophils Absolute: 0.1 10*3/uL (ref 0.0–0.1)
Basophils Relative: 1.2 % (ref 0.0–3.0)
Eosinophils Absolute: 0.1 10*3/uL (ref 0.0–0.7)
Eosinophils Relative: 1.5 % (ref 0.0–5.0)
HCT: 35.2 % — ABNORMAL LOW (ref 36.0–46.0)
Hemoglobin: 11.7 g/dL — ABNORMAL LOW (ref 12.0–15.0)
Lymphocytes Relative: 19.2 % (ref 12.0–46.0)
Lymphs Abs: 1.6 10*3/uL (ref 0.7–4.0)
MCHC: 33.1 g/dL (ref 30.0–36.0)
MCV: 87.3 fl (ref 78.0–100.0)
Monocytes Absolute: 0.5 10*3/uL (ref 0.1–1.0)
Monocytes Relative: 5.8 % (ref 3.0–12.0)
Neutro Abs: 5.9 10*3/uL (ref 1.4–7.7)
Neutrophils Relative %: 72.3 % (ref 43.0–77.0)
Platelets: 238 10*3/uL (ref 150.0–400.0)
RBC: 4.03 Mil/uL (ref 3.87–5.11)
RDW: 13.5 % (ref 11.5–15.5)
WBC: 8.2 10*3/uL (ref 4.0–10.5)

## 2018-12-04 LAB — IBC PANEL
Iron: 52 ug/dL (ref 42–145)
Saturation Ratios: 16.1 % — ABNORMAL LOW (ref 20.0–50.0)
Transferrin: 230 mg/dL (ref 212.0–360.0)

## 2018-12-04 LAB — VITAMIN B12: Vitamin B-12: >1515 pg/mL — ABNORMAL HIGH (ref 211–911)

## 2018-12-04 LAB — SEDIMENTATION RATE: Sed Rate: 32 mm/hr — ABNORMAL HIGH (ref 0–30)

## 2018-12-04 LAB — FERRITIN: Ferritin: 24.8 ng/mL (ref 10.0–291.0)

## 2018-12-09 ENCOUNTER — Ambulatory Visit (INDEPENDENT_AMBULATORY_CARE_PROVIDER_SITE_OTHER): Payer: Medicare Other | Admitting: Pulmonary Disease

## 2018-12-09 ENCOUNTER — Encounter: Payer: Self-pay | Admitting: Pulmonary Disease

## 2018-12-09 DIAGNOSIS — R0609 Other forms of dyspnea: Secondary | ICD-10-CM

## 2018-12-09 DIAGNOSIS — J449 Chronic obstructive pulmonary disease, unspecified: Secondary | ICD-10-CM

## 2018-12-09 NOTE — Progress Notes (Signed)
PULMONARY OFFICE FOLLOW UP NOTE  Requesting MD/Service: Eliezer Lofts, MD Date of initial consultation: 07/06/16 Reason for consultation: COPD  PT PROFILE: 68 y.o.F never smoker but with significant second hand smoke exposure and prior employment in [employer]. S/P RUL resection in 2008 for lung cancer. Carries diagnosis of COPD. Referred for eval of class II-III dyspnea.  DATA: Echocardiogram 06/21/15: normal LVEF, mildly dilated LA. RVSP est 37 mmHg CT chest 03/10/16: No evidence of pulmonary embolism. No acute findings in the thorax to account for the patient's symptoms. Status post right upper lobectomy PFT 08/07/16: FVC:  2.43L (87 %pred), FEV1:  1.61L (79 %pred), FEV1/FVC: 66% , TLC: 4.68 L (101 %pred), DLCO 61 %pred, DLCO/VA 110% PFT 11/26/17: FVC: 2.28 L (83 %pred), FEV1: 1.65 L (75 %pred), FEV1/FVC: 72%, TLC: 4.35 L (94 %pred), DLCO 59 %pred, DLCO/VA 105% PFTs 11/25/18: FVC: 2.06 L (70 %pred), FEV1: 1.46 L (66 %pred), FEV1/FVC: 71%, TLC: 3.64 L (74 %pred), DLCO 77 %pred, DLCO/VA 108% predicted.  Flow volume curve consistent with obstruction.  No significant improvement after bronchodilator therapy.   INTERVAL:  Last visit 08/28/2018.  No major events since that time.  SUBJ:  This is a scheduled follow-up.  It was performed remotely due to the coronavirus pandemic. She states that overall she is "doing alright".  She continues to use Advair inconsistently but for the most part is using it daily.  She remains on PRN albuterol which she is using 1-2 times per day on average.  She notices benefit when she uses albuterol.  She has undergone recent cardiac evaluation revealed no new cardiac problems.    Presently she denies CP, fever, purulent sputum, hemoptysis, LE edema and calf tenderness.   OBJ: There were no vitals filed for this visit.  EXAM:  Due to the remote nature of this encounter, no physical examination could be performed.   DATA:   BMP Latest Ref Rng & Units  11/27/2018 11/21/2018 06/22/2018  Glucose 70 - 99 mg/dL - 102(H) 119(H)  BUN 6 - 23 mg/dL - 17 16  Creatinine 0.44 - 1.00 mg/dL 1.20(H) 1.20 1.11(H)  Sodium 135 - 145 mEq/L - 137 134(L)  Potassium 3.5 - 5.1 mEq/L - 4.2 4.0  Chloride 96 - 112 mEq/L - 102 100  CO2 19 - 32 mEq/L - 27 24  Calcium 8.4 - 10.5 mg/dL - 9.1 8.8(L)    CBC Latest Ref Rng & Units 12/04/2018 11/21/2018 06/22/2018  WBC 4.0 - 10.5 K/uL 8.2 7.3 11.2(H)  Hemoglobin 12.0 - 15.0 g/dL 11.7(L) 11.5(L) 11.2(L)  Hematocrit 36.0 - 46.0 % 35.2(L) 35.0(L) 35.2(L)  Platelets 150.0 - 400.0 K/uL 238.0 250.0 238    CXR: No new film  IMPRESSION:     ICD-10-CM   1. COPD, mild-moderate ([place])  J44.9   2. DOE (dyspnea on exertion)  R06.09    PFTs reveal mild decrement in FEV1 suggesting that there is a variable (reversible) component to her obstructive lung disease.  PLAN:  Continue Advair HFA inhaler - I encouraged that she use it more consistently (twice daily) and reminded her to rinse mouth after use  Continue albuterol (Ventolin) inhaler as needed for increased shortness of breath, wheezing, chest tightness, cough  Follow-up 3-4 months.  Call sooner if needed   Merton Border, MD PCCM service Mobile [phone] Pager [phone] 12/09/2018 8:53 AM

## 2018-12-09 NOTE — Patient Instructions (Addendum)
Continue Advair HFA inhaler - I encouraged that you use it consistently (twice daily).  Remember to rinse mouth after use  Continue albuterol (Ventolin) inhaler as needed for increased shortness of breath, wheezing, chest tightness, cough  Follow-up 3-4 months.  Call sooner if needed

## 2018-12-22 ENCOUNTER — Other Ambulatory Visit: Payer: Self-pay | Admitting: Internal Medicine

## 2018-12-23 ENCOUNTER — Other Ambulatory Visit: Payer: Self-pay | Admitting: Internal Medicine

## 2018-12-25 DIAGNOSIS — I10 Essential (primary) hypertension: Secondary | ICD-10-CM | POA: Diagnosis not present

## 2018-12-25 DIAGNOSIS — J449 Chronic obstructive pulmonary disease, unspecified: Secondary | ICD-10-CM | POA: Diagnosis not present

## 2018-12-25 DIAGNOSIS — C3490 Malignant neoplasm of unspecified part of unspecified bronchus or lung: Secondary | ICD-10-CM | POA: Diagnosis not present

## 2018-12-25 DIAGNOSIS — E7800 Pure hypercholesterolemia, unspecified: Secondary | ICD-10-CM | POA: Diagnosis not present

## 2019-01-01 ENCOUNTER — Ambulatory Visit (INDEPENDENT_AMBULATORY_CARE_PROVIDER_SITE_OTHER): Payer: Medicare Other | Admitting: Internal Medicine

## 2019-01-01 ENCOUNTER — Encounter: Payer: Self-pay | Admitting: Internal Medicine

## 2019-01-01 ENCOUNTER — Other Ambulatory Visit: Payer: Self-pay

## 2019-01-01 DIAGNOSIS — J449 Chronic obstructive pulmonary disease, unspecified: Secondary | ICD-10-CM

## 2019-01-01 DIAGNOSIS — N183 Chronic kidney disease, stage 3 unspecified: Secondary | ICD-10-CM

## 2019-01-01 DIAGNOSIS — G20 Parkinson's disease: Secondary | ICD-10-CM

## 2019-01-01 DIAGNOSIS — M7989 Other specified soft tissue disorders: Secondary | ICD-10-CM

## 2019-01-01 DIAGNOSIS — E785 Hyperlipidemia, unspecified: Secondary | ICD-10-CM

## 2019-01-01 DIAGNOSIS — R945 Abnormal results of liver function studies: Secondary | ICD-10-CM

## 2019-01-01 DIAGNOSIS — F419 Anxiety disorder, unspecified: Secondary | ICD-10-CM

## 2019-01-01 DIAGNOSIS — F339 Major depressive disorder, recurrent, unspecified: Secondary | ICD-10-CM

## 2019-01-01 DIAGNOSIS — R7989 Other specified abnormal findings of blood chemistry: Secondary | ICD-10-CM

## 2019-01-01 DIAGNOSIS — Z85038 Personal history of other malignant neoplasm of large intestine: Secondary | ICD-10-CM

## 2019-01-01 DIAGNOSIS — I10 Essential (primary) hypertension: Secondary | ICD-10-CM

## 2019-01-01 DIAGNOSIS — R300 Dysuria: Secondary | ICD-10-CM | POA: Diagnosis not present

## 2019-01-01 DIAGNOSIS — E7800 Pure hypercholesterolemia, unspecified: Secondary | ICD-10-CM

## 2019-01-01 DIAGNOSIS — R739 Hyperglycemia, unspecified: Secondary | ICD-10-CM

## 2019-01-01 DIAGNOSIS — R140 Abdominal distension (gaseous): Secondary | ICD-10-CM

## 2019-01-01 LAB — BASIC METABOLIC PANEL
BUN: 21 mg/dL (ref 6–23)
CO2: 26 mEq/L (ref 19–32)
Calcium: 9.3 mg/dL (ref 8.4–10.5)
Chloride: 101 mEq/L (ref 96–112)
Creatinine, Ser: 1.5 mg/dL — ABNORMAL HIGH (ref 0.40–1.20)
GFR: 34.42 mL/min — ABNORMAL LOW (ref >60.00)
Glucose, Bld: 92 mg/dL (ref 70–99)
Potassium: 4.4 mEq/L (ref 3.5–5.1)
Sodium: 137 mEq/L (ref 135–145)

## 2019-01-01 LAB — HEPATIC FUNCTION PANEL
ALT: 4 U/L (ref 0–35)
AST: 13 U/L (ref 0–37)
Albumin: 4 g/dL (ref 3.5–5.2)
Alkaline Phosphatase: 89 U/L (ref 39–117)
Bilirubin, Direct: 0.1 mg/dL (ref 0.0–0.3)
Total Bilirubin: 0.3 mg/dL (ref 0.2–1.2)
Total Protein: 6.7 g/dL (ref 6.0–8.3)

## 2019-01-01 LAB — CBC WITH DIFFERENTIAL/PLATELET
Basophils Absolute: 0.1 10*3/uL (ref 0.0–0.1)
Basophils Relative: 0.7 % (ref 0.0–3.0)
Eosinophils Absolute: 0.2 10*3/uL (ref 0.0–0.7)
Eosinophils Relative: 1.4 % (ref 0.0–5.0)
HCT: 35.8 % — ABNORMAL LOW (ref 36.0–46.0)
Hemoglobin: 11.8 g/dL — ABNORMAL LOW (ref 12.0–15.0)
Lymphocytes Relative: 14.2 % (ref 12.0–46.0)
Lymphs Abs: 1.6 10*3/uL (ref 0.7–4.0)
MCHC: 32.9 g/dL (ref 30.0–36.0)
MCV: 87.9 fl (ref 78.0–100.0)
Monocytes Absolute: 0.7 10*3/uL (ref 0.1–1.0)
Monocytes Relative: 5.8 % (ref 3.0–12.0)
Neutro Abs: 9 10*3/uL — ABNORMAL HIGH (ref 1.4–7.7)
Neutrophils Relative %: 77.9 % — ABNORMAL HIGH (ref 43.0–77.0)
Platelets: 265 10*3/uL (ref 150.0–400.0)
RBC: 4.07 Mil/uL (ref 3.87–5.11)
RDW: 13.3 % (ref 11.5–15.5)
WBC: 11.5 10*3/uL — ABNORMAL HIGH (ref 4.0–10.5)

## 2019-01-01 LAB — URINALYSIS, ROUTINE W REFLEX MICROSCOPIC
Bilirubin Urine: NEGATIVE
Ketones, ur: NEGATIVE
Nitrite: POSITIVE — AB
Specific Gravity, Urine: 1.02 (ref 1.000–1.030)
Total Protein, Urine: NEGATIVE
Urine Glucose: NEGATIVE
Urobilinogen, UA: 0.2 (ref 0.0–1.0)
pH: 6.5 (ref 5.0–8.0)

## 2019-01-01 MED ORDER — ALPRAZOLAM 0.5 MG PO TABS: ORAL_TABLET | ORAL | 0 refills | Status: DC

## 2019-01-01 MED ORDER — SULFAMETHOXAZOLE-TRIMETHOPRIM 800-160 MG PO TABS: 1.0000 | ORAL_TABLET | Freq: Two times a day (BID) | ORAL | 0 refills | Status: DC

## 2019-01-01 NOTE — Progress Notes (Signed)
Patient ID: Victoria Hanson, female   DOB: [birth year], 68 y.o.   MRN: [id number]   Virtual Visit via telephone Note  This visit type was conducted due to national recommendations for restrictions regarding the COVID-19 pandemic (e.g. social distancing).  This format is felt to be most appropriate for this patient at this time.  All issues noted in this document were discussed and addressed.  No physical exam was performed (except for noted visual exam findings with Video Visits).   I connected with Victoria Hanson by telephone and verified that I am speaking with the correct person using two identifiers. Location patient: home Location provider: work Persons participating in the visit: patient, provider  I discussed the limitations, risks, security and privacy concerns of performing an evaluation and management service by telephone and the availability of in person appointments. The patient expressed understanding and agreed to proceed.   Reason for visit: scheduled follow up.   HPI: She reports her breathing is stable - some better.  Using advair bid now.  Sees pulmonary.  Saw Victoria Hanson 12/09/18.  Recommended f/u in 3-4 months.  She reports she feels her stomach is swelling.  Weight 207 pounds this am.  States has felt more swollen for the past 3 weeks.  No nausea or vomiting.  Bowels are moving.  Had bowel movement this am.   Reports lower extremity swelling is better currently.  Intermittent swelling.  Reports over the last few days, increased bladder spasm and increased urinary frequency.  Followed by neurology for parkinsons.     ROS: See pertinent positives and negatives per HPI.  Past Medical History:  Diagnosis Date  . Anxiety   . Asthma   . Atypical chest pain    a. 2003 Cath: reportedly nl per pt Victoria Hanson);  b. 04/2015 [place] Admission, neg troponin.  . Colon cancer ([place]) 1999   a. 1999: Pt had partial colectomy. Did develop lung metastasis. Had right upper lobectomy in 01/2007  then had associated chemotherapy.  Marland Kitchen COPD (chronic obstructive pulmonary disease) ([place])   . DDD (degenerative disc disease), lumbosacral   . Depression   . Echocardiogram abnormal    a. 02/2010 Echo: EF 55-60%, mild LAE, no regional wall motion abnormalities  . Elevated transaminase level    a. ? NASH;  b. 06/2015 - nl LFTs.  . GERD (gastroesophageal reflux disease)   . Hematuria   . Holter monitor, abnormal    a. 12/2009 Holter monitoring: NSR, rare PACs and PVCs.  Marland Kitchen Hypercholesterolemia   . Hyperlipidemia   . Hypertension   . Leaky heart valve    a. Per pt report - no evidence of valvular abnormalities on prior echoes.  . Lesion of skin of Right breast 10/20/2017  . Lung metastases ([place]) 2008   a. S/P resection (lobectomy - right)  . Morbid obesity ([place])   . Nephrolithiasis   . Parkinson disease ([place]) 08/2016   [email or web address]    Past Surgical History:  Procedure Laterality Date  . BREAST EXCISIONAL BIOPSY Right 10/07/2009   benign  . CARDIAC CATHETERIZATION  2003   negative as per pt report  . CHOLECYSTECTOMY  2000  . COLON SURGERY     Colon cancer, removed part of large colon  . COLONOSCOPY N/A 04/25/2015   Procedure: COLONOSCOPY;  Surgeon: Victoria Silvas, MD;  Location: [facility] ENDOSCOPY;  Service: Endoscopy;  Laterality: N/A;  . COLONOSCOPY WITH PROPOFOL N/A 06/14/2017   Procedure: COLONOSCOPY WITH PROPOFOL;  Surgeon: Victoria Silvas, MD;  Location: [facility] ENDOSCOPY;  Service: Endoscopy;  Laterality: N/A;  . LOBECTOMY  01/2007   Right lung, upper lobe right side removed  . TOTAL ABDOMINAL HYSTERECTOMY     abnormal bleeding  . TUBAL LIGATION  1980    Family History  Problem Relation Age of Onset  . Coronary artery disease Mother        died @ 61 of MI.  . Mental illness Mother   . Diabetes Mother   . Heart attack Father 8       MI  . Cancer Father        lung - died in his mid-70's  . Hypertension Father   . Diabetes Father   . Sudden death Brother   . Mental illness  Brother   . Heart attack Brother        MI @ 45, died @ age 65.  . Breast cancer Neg Hx     SOCIAL HX: reviewed.     Current Outpatient Medications:  .  ALPRAZolam (XANAX) 0.5 MG tablet, 1/2 tablet q day prn, Disp: 30 tablet, Rfl: 0 .  aspirin EC 81 MG tablet, Take 81 mg by mouth daily., Disp: , Rfl:  .  busPIRone (BUSPAR) 10 MG tablet, Take 1 tablet (10 mg total) by mouth daily., Disp: 30 tablet, Rfl: 1 .  carbidopa-levodopa (SINEMET IR) 25-100 MG tablet, Take 2 tablets by mouth 3 (three) times daily. , Disp: , Rfl:  .  citalopram (CELEXA) 10 MG tablet, Take 1 tablet (10 mg total) by mouth daily., Disp: 90 tablet, Rfl: 1 .  fluticasone (FLONASE) 50 MCG/ACT nasal spray, Place 2 sprays into both nostrils daily., Disp: 16 g, Rfl: 6 .  fluticasone-salmeterol (ADVAIR HFA) 115-21 MCG/ACT inhaler, Inhale 2 puffs into the lungs 2 (two) times daily., Disp: 1 Inhaler, Rfl: 12 .  furosemide (LASIX) 20 MG tablet, , Disp: , Rfl:  .  hydrochlorothiazide (HYDRODIURIL) 12.5 MG tablet, Take 1 tablet (12.5 mg total) by mouth daily., Disp: 90 tablet, Rfl: 1 .  Multiple Vitamin (MULTIVITAMIN) tablet, Take 1 tablet by mouth daily., Disp: , Rfl:  .  omeprazole (PRILOSEC) 20 MG capsule, TAKE 1 CAPSULE BY MOUTH DAILY, Disp: 90 capsule, Rfl: 1 .  potassium chloride SA (K-DUR,KLOR-CON) 20 MEQ tablet, Take 1 tablet (20 mEq total) by mouth 2 (two) times daily., Disp: 180 tablet, Rfl: 1 .  pravastatin (PRAVACHOL) 10 MG tablet, TAKE 1 TABLET BY MOUTH DAILY, Disp: 90 tablet, Rfl: 1 .  sulfamethoxazole-trimethoprim (BACTRIM DS) 800-160 MG tablet, Take 1 tablet by mouth 2 (two) times daily., Disp: 10 tablet, Rfl: 0 .  traZODone (DESYREL) 50 MG tablet, Take 2 tablets (100 mg total) by mouth at bedtime., Disp: 180 tablet, Rfl: 1 .  VENTOLIN HFA 108 (90 Base) MCG/ACT inhaler, INHALE 2 PUFFS BY MOUTH INTO THE LUNGS EVERY 6 HOURS AS NEEDED FOR WHEEZING, Disp: 18 g, Rfl: 1 .  vitamin B-12 (CYANOCOBALAMIN) 100 MCG tablet, Take  100 mcg by mouth daily., Disp: , Rfl:   EXAM:  VITALS per patient if applicable:  GENERAL: alert, oriented, appears well and in no acute distress  HEENT: atraumatic, conjunttiva clear, no obvious abnormalities on inspection of external nose and ears  NECK: normal movements of the head and neck  LUNGS: on inspection no signs of respiratory distress, breathing rate appears normal, no obvious gross SOB, gasping or wheezing  CV: no obvious cyanosis  MS: moves all visible extremities without noticeable abnormality  PSYCH/NEURO: pleasant and cooperative, no  obvious depression or anxiety, speech and thought processing grossly intact  ASSESSMENT AND PLAN:  Discussed the following assessment and plan:  Anxiety, mild On citalopram and buspar.  Has xanax to take prn.  Follow.  Overall stable.    Chronic obstructive pulmonary disease ([place]) Using advair regularly now.  Breathing improved.  Follow.  Seeing pulmonary.    CKD (chronic kidney disease), stage III Stay hydrated.  Avoid antiinflammatories.  Recheck metabolic panel.    Depression, recurrent ([place]) On citalopram and buspar.  Overall stable.  Follow.    Dysuria With urinary symptoms as outlined.  Have her come in for labs today.  Check urine for infection. Follow.    Elevated liver function tests With bloating, will check liver metabolic panel and liver function tests.  Eating.  Bowels moving.    Hypercholesterolemia Did not tolerate crestor.  Low cholesterol diet and exercise. Follow lipid panel.   Hyperglycemia Low carb diet and exercise.  Follow met b and a1c.   Hypertension Blood pressure has been doing well.  Follow pressures.  Follow metabolic panel.    Parkinson's disease ([place]) Followed by neurology.   Swelling of lower extremity Stable.  Flares intermittently.    Abdominal bloating Abdominal bloating as outlined. Eating.  Bowels moving.  Check labs as outlined.  Check urine for infection.  Discussed further  w/up including possible scan.  Start with labs first.  Follow closely.      I discussed the assessment and treatment plan with the patient. The patient was provided an opportunity to ask questions and all were answered. The patient agreed with the plan and demonstrated an understanding of the instructions.   The patient was advised to call back or seek an in-person evaluation if the symptoms worsen or if the condition fails to improve as anticipated.  I provided 25 minutes of non-face-to-face time during this encounter.   Einar Pheasant, MD

## 2019-01-02 ENCOUNTER — Telehealth: Payer: Self-pay | Admitting: *Deleted

## 2019-01-02 DIAGNOSIS — D72829 Elevated white blood cell count, unspecified: Secondary | ICD-10-CM

## 2019-01-02 DIAGNOSIS — R944 Abnormal results of kidney function studies: Secondary | ICD-10-CM

## 2019-01-02 NOTE — Telephone Encounter (Signed)
Please place future orders for lab appt.  

## 2019-01-03 LAB — URINE CULTURE
MICRO NUMBER:: 697191
SPECIMEN QUALITY:: ADEQUATE

## 2019-01-04 ENCOUNTER — Encounter: Payer: Self-pay | Admitting: Internal Medicine

## 2019-01-04 DIAGNOSIS — R140 Abdominal distension (gaseous): Secondary | ICD-10-CM | POA: Insufficient documentation

## 2019-01-04 NOTE — Assessment & Plan Note (Signed)
On citalopram and buspar.  Has xanax to take prn.  Follow.  Overall stable.

## 2019-01-04 NOTE — Assessment & Plan Note (Signed)
With urinary symptoms as outlined.  Have her come in for labs today.  Check urine for infection. Follow.

## 2019-01-04 NOTE — Assessment & Plan Note (Signed)
Stay hydrated.  Avoid antiinflammatories.  Recheck metabolic panel.

## 2019-01-04 NOTE — Telephone Encounter (Signed)
Orders placed for f/u labs.  

## 2019-01-04 NOTE — Assessment & Plan Note (Signed)
Abdominal bloating as outlined. Eating.  Bowels moving.  Check labs as outlined.  Check urine for infection.  Discussed further w/up including possible scan.  Start with labs first.  Follow closely.

## 2019-01-04 NOTE — Assessment & Plan Note (Signed)
Using advair regularly now.  Breathing improved.  Follow.  Seeing pulmonary.

## 2019-01-04 NOTE — Assessment & Plan Note (Signed)
Blood pressure has been doing well.  Follow pressures.  Follow metabolic panel.   

## 2019-01-04 NOTE — Assessment & Plan Note (Signed)
On citalopram and buspar.  Overall stable.  Follow.

## 2019-01-04 NOTE — Assessment & Plan Note (Signed)
Stable.  Flares intermittently.

## 2019-01-04 NOTE — Assessment & Plan Note (Signed)
Did not tolerate crestor.  Low cholesterol diet and exercise. Follow lipid panel.

## 2019-01-04 NOTE — Assessment & Plan Note (Signed)
With bloating, will check liver metabolic panel and liver function tests.  Eating.  Bowels moving.

## 2019-01-04 NOTE — Assessment & Plan Note (Signed)
Low carb diet and exercise.  Follow met b and a1c.  

## 2019-01-04 NOTE — Assessment & Plan Note (Signed)
Followed by neurology.   

## 2019-01-06 ENCOUNTER — Other Ambulatory Visit: Payer: Medicare Other

## 2019-01-06 ENCOUNTER — Other Ambulatory Visit (INDEPENDENT_AMBULATORY_CARE_PROVIDER_SITE_OTHER): Payer: Medicare Other

## 2019-01-06 ENCOUNTER — Other Ambulatory Visit: Payer: Self-pay

## 2019-01-06 DIAGNOSIS — R944 Abnormal results of kidney function studies: Secondary | ICD-10-CM | POA: Diagnosis not present

## 2019-01-06 DIAGNOSIS — D72829 Elevated white blood cell count, unspecified: Secondary | ICD-10-CM

## 2019-01-07 ENCOUNTER — Other Ambulatory Visit: Payer: Self-pay | Admitting: Internal Medicine

## 2019-01-07 DIAGNOSIS — N183 Chronic kidney disease, stage 3 unspecified: Secondary | ICD-10-CM

## 2019-01-07 DIAGNOSIS — D649 Anemia, unspecified: Secondary | ICD-10-CM

## 2019-01-07 DIAGNOSIS — R944 Abnormal results of kidney function studies: Secondary | ICD-10-CM

## 2019-01-07 LAB — CBC WITH DIFFERENTIAL/PLATELET
Basophils Absolute: 0.3 10*3/uL — ABNORMAL HIGH (ref 0.0–0.1)
Basophils Relative: 3.1 % — ABNORMAL HIGH (ref 0.0–3.0)
Eosinophils Absolute: 0.2 10*3/uL (ref 0.0–0.7)
Eosinophils Relative: 2.1 % (ref 0.0–5.0)
HCT: 33.3 % — ABNORMAL LOW (ref 36.0–46.0)
Hemoglobin: 11.1 g/dL — ABNORMAL LOW (ref 12.0–15.0)
Lymphocytes Relative: 21.9 % (ref 12.0–46.0)
Lymphs Abs: 1.9 10*3/uL (ref 0.7–4.0)
MCHC: 33.2 g/dL (ref 30.0–36.0)
MCV: 88.6 fl (ref 78.0–100.0)
Monocytes Absolute: 0.7 10*3/uL (ref 0.1–1.0)
Monocytes Relative: 7.6 % (ref 3.0–12.0)
Neutro Abs: 5.8 10*3/uL (ref 1.4–7.7)
Neutrophils Relative %: 65.3 % (ref 43.0–77.0)
Platelets: 257 10*3/uL (ref 150.0–400.0)
RBC: 3.76 Mil/uL — ABNORMAL LOW (ref 3.87–5.11)
RDW: 13.6 % (ref 11.5–15.5)
WBC: 8.9 10*3/uL (ref 4.0–10.5)

## 2019-01-07 LAB — BASIC METABOLIC PANEL
BUN: 19 mg/dL (ref 6–23)
CO2: 23 mEq/L (ref 19–32)
Calcium: 9.2 mg/dL (ref 8.4–10.5)
Chloride: 104 mEq/L (ref 96–112)
Creatinine, Ser: 1.63 mg/dL — ABNORMAL HIGH (ref 0.40–1.20)
GFR: 31.27 mL/min — ABNORMAL LOW (ref >60.00)
Glucose, Bld: 101 mg/dL — ABNORMAL HIGH (ref 70–99)
Potassium: 4.5 mEq/L (ref 3.5–5.1)
Sodium: 135 mEq/L (ref 135–145)

## 2019-01-07 NOTE — Progress Notes (Signed)
Orders placed for renal ultrasound and referral to nephrology.

## 2019-01-07 NOTE — Progress Notes (Signed)
Order placed for f/u labs.  

## 2019-01-08 ENCOUNTER — Other Ambulatory Visit: Payer: Self-pay | Admitting: Internal Medicine

## 2019-01-12 ENCOUNTER — Other Ambulatory Visit (INDEPENDENT_AMBULATORY_CARE_PROVIDER_SITE_OTHER): Payer: Medicare Other

## 2019-01-12 ENCOUNTER — Other Ambulatory Visit: Payer: Self-pay

## 2019-01-12 DIAGNOSIS — R944 Abnormal results of kidney function studies: Secondary | ICD-10-CM | POA: Diagnosis not present

## 2019-01-12 DIAGNOSIS — D649 Anemia, unspecified: Secondary | ICD-10-CM | POA: Diagnosis not present

## 2019-01-12 LAB — URINALYSIS, ROUTINE W REFLEX MICROSCOPIC
Bilirubin Urine: NEGATIVE
Ketones, ur: NEGATIVE
Nitrite: NEGATIVE
Specific Gravity, Urine: 1.01 (ref 1.000–1.030)
Total Protein, Urine: NEGATIVE
Urine Glucose: NEGATIVE
Urobilinogen, UA: 0.2 (ref 0.0–1.0)
pH: 5.5 (ref 5.0–8.0)

## 2019-01-12 LAB — BASIC METABOLIC PANEL
BUN: 20 mg/dL (ref 6–23)
CO2: 25 mEq/L (ref 19–32)
Calcium: 9.1 mg/dL (ref 8.4–10.5)
Chloride: 100 mEq/L (ref 96–112)
Creatinine, Ser: 1.19 mg/dL (ref 0.40–1.20)
GFR: 44.96 mL/min — ABNORMAL LOW (ref >60.00)
Glucose, Bld: 100 mg/dL — ABNORMAL HIGH (ref 70–99)
Potassium: 4.3 mEq/L (ref 3.5–5.1)
Sodium: 133 mEq/L — ABNORMAL LOW (ref 135–145)

## 2019-01-13 ENCOUNTER — Other Ambulatory Visit: Payer: Medicare Other

## 2019-01-13 ENCOUNTER — Other Ambulatory Visit: Payer: Self-pay | Admitting: Internal Medicine

## 2019-01-13 DIAGNOSIS — R8281 Pyuria: Secondary | ICD-10-CM | POA: Diagnosis not present

## 2019-01-13 NOTE — Progress Notes (Signed)
Order placed for urine culture add on.

## 2019-01-14 LAB — PROTEIN ELECTROPHORESIS, SERUM
Albumin ELP: 3.5 g/dL — ABNORMAL LOW (ref 3.8–4.8)
Alpha 1: 0.3 g/dL (ref 0.2–0.3)
Alpha 2: 0.9 g/dL (ref 0.5–0.9)
Beta 2: 0.3 g/dL (ref 0.2–0.5)
Beta Globulin: 0.4 g/dL (ref 0.4–0.6)
Gamma Globulin: 0.9 g/dL (ref 0.8–1.7)
Total Protein: 6.4 g/dL (ref 6.1–8.1)

## 2019-01-15 ENCOUNTER — Other Ambulatory Visit: Payer: Self-pay | Admitting: Internal Medicine

## 2019-01-15 DIAGNOSIS — E871 Hypo-osmolality and hyponatremia: Secondary | ICD-10-CM

## 2019-01-15 LAB — URINE CULTURE
MICRO NUMBER:: 734912
Result:: NO GROWTH
SPECIMEN QUALITY:: ADEQUATE

## 2019-01-15 NOTE — Progress Notes (Signed)
Order placed for f/u sodium.  ?

## 2019-01-16 ENCOUNTER — Other Ambulatory Visit: Payer: Self-pay

## 2019-01-16 ENCOUNTER — Ambulatory Visit
Admission: RE | Admit: 2019-01-16 | Discharge: 2019-01-16 | Disposition: A | Discharge status: Home / Self Care | Payer: Medicare Other | Source: Ambulatory Visit | Attending: Internal Medicine | Admitting: Internal Medicine

## 2019-01-16 DIAGNOSIS — G4752 REM sleep behavior disorder: Secondary | ICD-10-CM | POA: Diagnosis not present

## 2019-01-16 DIAGNOSIS — G20 Parkinson's disease: Secondary | ICD-10-CM | POA: Diagnosis not present

## 2019-01-16 DIAGNOSIS — N183 Chronic kidney disease, stage 3 unspecified: Secondary | ICD-10-CM

## 2019-01-16 DIAGNOSIS — N189 Chronic kidney disease, unspecified: Secondary | ICD-10-CM | POA: Diagnosis not present

## 2019-01-16 DIAGNOSIS — G4700 Insomnia, unspecified: Secondary | ICD-10-CM | POA: Diagnosis not present

## 2019-01-20 ENCOUNTER — Other Ambulatory Visit: Payer: Self-pay

## 2019-01-20 ENCOUNTER — Other Ambulatory Visit (INDEPENDENT_AMBULATORY_CARE_PROVIDER_SITE_OTHER): Payer: Medicare Other

## 2019-01-20 DIAGNOSIS — E871 Hypo-osmolality and hyponatremia: Secondary | ICD-10-CM

## 2019-01-20 NOTE — Addendum Note (Signed)
Addended by: Leeanne Rio on: 01/20/2019 02:26 PM   Modules accepted: Orders

## 2019-01-21 LAB — SODIUM: Sodium: 137 mmol/L (ref 135–146)

## 2019-01-22 DIAGNOSIS — Z85038 Personal history of other malignant neoplasm of large intestine: Secondary | ICD-10-CM | POA: Diagnosis not present

## 2019-01-22 DIAGNOSIS — K219 Gastro-esophageal reflux disease without esophagitis: Secondary | ICD-10-CM | POA: Diagnosis not present

## 2019-01-22 DIAGNOSIS — D509 Iron deficiency anemia, unspecified: Secondary | ICD-10-CM | POA: Diagnosis not present

## 2019-01-22 DIAGNOSIS — R143 Flatulence: Secondary | ICD-10-CM | POA: Diagnosis not present

## 2019-01-23 ENCOUNTER — Inpatient Hospital Stay: Payer: Medicare Other | Admitting: Oncology

## 2019-01-23 ENCOUNTER — Inpatient Hospital Stay: Payer: Medicare Other | Attending: Oncology

## 2019-01-23 ENCOUNTER — Other Ambulatory Visit: Payer: Self-pay

## 2019-01-23 ENCOUNTER — Encounter: Payer: Self-pay | Admitting: Oncology

## 2019-01-23 VITALS — BP 161/80 | HR 70 | Temp 97.6°F | Wt 207.1 lb

## 2019-01-23 DIAGNOSIS — E785 Hyperlipidemia, unspecified: Secondary | ICD-10-CM | POA: Diagnosis not present

## 2019-01-23 DIAGNOSIS — Z902 Acquired absence of lung [part of]: Secondary | ICD-10-CM | POA: Diagnosis not present

## 2019-01-23 DIAGNOSIS — E7800 Pure hypercholesterolemia, unspecified: Secondary | ICD-10-CM | POA: Diagnosis not present

## 2019-01-23 DIAGNOSIS — Z90710 Acquired absence of both cervix and uterus: Secondary | ICD-10-CM | POA: Diagnosis not present

## 2019-01-23 DIAGNOSIS — Z79899 Other long term (current) drug therapy: Secondary | ICD-10-CM | POA: Diagnosis not present

## 2019-01-23 DIAGNOSIS — J449 Chronic obstructive pulmonary disease, unspecified: Secondary | ICD-10-CM | POA: Insufficient documentation

## 2019-01-23 DIAGNOSIS — R197 Diarrhea, unspecified: Secondary | ICD-10-CM | POA: Diagnosis not present

## 2019-01-23 DIAGNOSIS — Z801 Family history of malignant neoplasm of trachea, bronchus and lung: Secondary | ICD-10-CM | POA: Insufficient documentation

## 2019-01-23 DIAGNOSIS — G20 Parkinson's disease: Secondary | ICD-10-CM | POA: Diagnosis not present

## 2019-01-23 DIAGNOSIS — Z9049 Acquired absence of other specified parts of digestive tract: Secondary | ICD-10-CM | POA: Diagnosis not present

## 2019-01-23 DIAGNOSIS — Z85038 Personal history of other malignant neoplasm of large intestine: Secondary | ICD-10-CM | POA: Diagnosis not present

## 2019-01-23 DIAGNOSIS — Z7982 Long term (current) use of aspirin: Secondary | ICD-10-CM | POA: Diagnosis not present

## 2019-01-23 DIAGNOSIS — Z7951 Long term (current) use of inhaled steroids: Secondary | ICD-10-CM | POA: Insufficient documentation

## 2019-01-23 DIAGNOSIS — I10 Essential (primary) hypertension: Secondary | ICD-10-CM | POA: Diagnosis not present

## 2019-01-23 DIAGNOSIS — F419 Anxiety disorder, unspecified: Secondary | ICD-10-CM | POA: Insufficient documentation

## 2019-01-23 DIAGNOSIS — Z9221 Personal history of antineoplastic chemotherapy: Secondary | ICD-10-CM | POA: Insufficient documentation

## 2019-01-24 LAB — CEA: CEA: 1 ng/mL (ref 0.0–4.7)

## 2019-01-24 LAB — CANCER ANTIGEN 19-9: CA 19-9: 12 U/mL (ref 0–35)

## 2019-01-24 LAB — CA 125: Cancer Antigen (CA) 125: 8.5 U/mL (ref 0.0–38.1)

## 2019-01-24 NOTE — Progress Notes (Signed)
This encounter was created in error - please disregard.

## 2019-01-26 ENCOUNTER — Inpatient Hospital Stay (HOSPITAL_BASED_OUTPATIENT_CLINIC_OR_DEPARTMENT_OTHER): Payer: Medicare Other | Admitting: Oncology

## 2019-01-26 ENCOUNTER — Other Ambulatory Visit: Payer: Self-pay

## 2019-01-26 ENCOUNTER — Encounter: Payer: Self-pay | Admitting: Oncology

## 2019-01-26 DIAGNOSIS — Z85118 Personal history of other malignant neoplasm of bronchus and lung: Secondary | ICD-10-CM | POA: Diagnosis not present

## 2019-01-26 DIAGNOSIS — R5383 Other fatigue: Secondary | ICD-10-CM | POA: Diagnosis not present

## 2019-01-26 DIAGNOSIS — R197 Diarrhea, unspecified: Secondary | ICD-10-CM | POA: Diagnosis not present

## 2019-01-26 DIAGNOSIS — Z79899 Other long term (current) drug therapy: Secondary | ICD-10-CM

## 2019-01-26 DIAGNOSIS — R109 Unspecified abdominal pain: Secondary | ICD-10-CM | POA: Diagnosis not present

## 2019-01-26 DIAGNOSIS — E785 Hyperlipidemia, unspecified: Secondary | ICD-10-CM

## 2019-01-26 DIAGNOSIS — F419 Anxiety disorder, unspecified: Secondary | ICD-10-CM

## 2019-01-26 DIAGNOSIS — C189 Malignant neoplasm of colon, unspecified: Secondary | ICD-10-CM

## 2019-01-26 DIAGNOSIS — R531 Weakness: Secondary | ICD-10-CM

## 2019-01-26 DIAGNOSIS — Z85038 Personal history of other malignant neoplasm of large intestine: Secondary | ICD-10-CM | POA: Diagnosis not present

## 2019-01-26 DIAGNOSIS — Z7982 Long term (current) use of aspirin: Secondary | ICD-10-CM

## 2019-01-26 NOTE — Progress Notes (Signed)
[place]  [phone] [phone]  ID: Victoria Hanson OB: 22-Jun-1950  MR#: [id number]  IRJ#:[id number]  Patient Care Team: Victoria Pheasant, MD as PCP - General (Unknown Physician Specialty) Victoria Silvas, MD (Gastroenterology)  I connected with Victoria Hanson on 01/26/19 at  9:45 AM EDT by telephone visit and verified that I am speaking with the correct person using two identifiers.   I discussed the limitations, risks, security and privacy concerns of performing an evaluation and management service by telemedicine and the availability of in-person appointments. I also discussed with the patient that there may be a patient responsible charge related to this service. The patient expressed understanding and agreed to proceed.   Other persons participating in the visit and their role in the encounter: Patient, MD  Patient's location: Home Provider's location: Clinic  CHIEF COMPLAINT: History of colon cancer, concern for recurrence.  INTERVAL HISTORY: This is a follow-up phone call for clinic visit initiated in person on Friday, January 23, 2019.  Patient could not complete evaluation secondary to ongoing diarrhea and agreed to follow-up by telephone today to complete visit.  She continues to have chronic diarrhea.  She has increased abdominal bloating which has her concerned since this is how she presented with her initial diagnosis of colon cancer and again on her recurrence.  She has no neurologic complaints.  She denies any recent fevers or illnesses.  She has a fair appetite, but denies weight loss.  She has no chest pain, shortness of breath, cough, or hemoptysis.  She denies any nausea, vomiting, or constipation.  She has no melena or hematochezia.  She has no urinary complaints.  Patient offers no further specific complaints today.  REVIEW OF SYSTEMS:   Review of Systems  Constitutional: Positive for malaise/fatigue. Negative for fever and  weight loss.  Respiratory: Negative.  Negative for cough and hemoptysis.   Cardiovascular: Negative.  Negative for chest pain and leg swelling.  Gastrointestinal: Positive for abdominal pain and diarrhea. Negative for blood in stool, constipation, melena, nausea and vomiting.  Genitourinary: Negative.  Negative for dysuria.  Musculoskeletal: Negative.   Skin: Negative.  Negative for rash.  Neurological: Positive for weakness. Negative for focal weakness and headaches.  Psychiatric/Behavioral: The patient is nervous/anxious.     As per HPI. Otherwise, a complete review of systems is negative.  PAST MEDICAL HISTORY: Past Medical History:  Diagnosis Date  . Anxiety   . Asthma   . Atypical chest pain    a. 2003 Cath: reportedly nl per pt Victoria Hanson);  b. 04/2015 Victoria Hanson Admission, neg troponin.  . Colon cancer ([place] [facility]) 1999   a. 1999: Pt had partial colectomy. Did develop lung metastasis. Had right upper lobectomy in 01/2007 then had associated chemotherapy.  Marland Kitchen COPD (chronic obstructive pulmonary disease) ([place])   . DDD (degenerative disc disease), lumbosacral   . Depression   . Echocardiogram abnormal    a. 02/2010 Echo: EF 55-60%, mild LAE, no regional wall motion abnormalities  . Elevated transaminase level    a. ? NASH;  b. 06/2015 - nl LFTs.  . GERD (gastroesophageal reflux disease)   . Hematuria   . Holter monitor, abnormal    a. 12/2009 Holter monitoring: NSR, rare PACs and PVCs.  Marland Kitchen Hypercholesterolemia   . Hyperlipidemia   . Hypertension   . Leaky heart valve    a. Per pt report - no evidence of valvular abnormalities on prior echoes.  . Lesion of skin of Right breast  10/20/2017  . Lung metastases ([place]) 2008   a. S/P resection (lobectomy - right)  . Morbid obesity ([place])   . Nephrolithiasis   . Parkinson disease ([place]) 08/2016   Victoria Hanson    PAST SURGICAL HISTORY: Past Surgical History:  Procedure Laterality Date  . BREAST EXCISIONAL BIOPSY Right 10/07/2009   benign  . CARDIAC  CATHETERIZATION  2003   negative as per pt report  . CHOLECYSTECTOMY  2000  . COLON SURGERY     Colon cancer, removed part of large colon  . COLONOSCOPY N/A 04/25/2015   Procedure: COLONOSCOPY;  Surgeon: Victoria Silvas, MD;  Location: [facility] ENDOSCOPY;  Service: Endoscopy;  Laterality: N/A;  . COLONOSCOPY WITH PROPOFOL N/A 06/14/2017   Procedure: COLONOSCOPY WITH PROPOFOL;  Surgeon: Victoria Silvas, MD;  Location: [facility] ENDOSCOPY;  Service: Endoscopy;  Laterality: N/A;  . LOBECTOMY  01/2007   Right lung, upper lobe right side removed  . TOTAL ABDOMINAL HYSTERECTOMY     abnormal bleeding  . TUBAL LIGATION  1980    FAMILY HISTORY: Family History  Problem Relation Age of Onset  . Coronary artery disease Mother        died @ 11 of MI.  . Mental illness Mother   . Diabetes Mother   . Heart attack Father 47       MI  . Cancer Father        lung - died in his mid-70's  . Hypertension Father   . Diabetes Father   . Sudden death Brother   . Mental illness Brother   . Heart attack Brother        MI @ 22, died @ age 28.  . Breast cancer Neg Hx     ADVANCED DIRECTIVES (Y/N):  N  HEALTH MAINTENANCE: Social History   Tobacco Use  . Smoking status: Passive Smoke Exposure - Never Smoker  . Smokeless tobacco: Never Used  . Tobacco comment: husband and son smoked in her home.  Substance Use Topics  . Alcohol use: Yes    Alcohol/week: 1.0 standard drinks    Types: 1 Glasses of wine per week    Comment: rare wine cooler.  . Drug use: No     Colonoscopy:  PAP:  Bone density:  Lipid panel:  Allergies  Allergen Reactions  . Macrobid [Nitrofurantoin Macrocrystal] Shortness Of Breath and Palpitations  . Antihistamines, Diphenhydramine-Type Other (See Comments)    She does not know which antihistamine she has an allergic reaction to.  . Aspirin Other (See Comments)    Makes her sick on the stomach if she takes too many and causes bleeding.  Of note, she can take ibuprofen. Other  reaction(s): Other (See Comments) Makes her sick on the stomach.Of note, she can take ibuprofen. Makes her sick on the stomach. Of note, she can take ibuprofen.  . Ceftin [Cefuroxime Axetil] Other (See Comments)    Chest pains  . Celecoxib Other (See Comments)    Reaction: Blood Disorder GI bleeding.  Marland Kitchen Cymbalta [Duloxetine Hcl] Other (See Comments)    Reaction: unknown  . Diphenhydramine Other (See Comments)  . Escitalopram Other (See Comments)  . Lexapro [Escitalopram Oxalate] Other (See Comments)    Reaction: Difficulty with speech  . Metronidazole Other (See Comments)    Other reaction(s): Unknown  . Nitrofurantoin Other (See Comments)  . Zoloft [Sertraline Hcl]   . Ciprofloxacin Other (See Comments)    Reaction: unknown    Current Outpatient Medications  Medication Sig Dispense Refill  .  ALPRAZolam (XANAX) 0.5 MG tablet 1/2 tablet q day prn 30 tablet 0  . aspirin EC 81 MG tablet Take 81 mg by mouth as needed.     . busPIRone (BUSPAR) 10 MG tablet Take 1 tablet (10 mg total) by mouth daily. 30 tablet 1  . carbidopa-levodopa (SINEMET IR) 25-100 MG tablet Take 2 tablets by mouth 3 (three) times daily.     . citalopram (CELEXA) 10 MG tablet Take 1 tablet (10 mg total) by mouth daily. 90 tablet 1  . fluticasone (FLONASE) 50 MCG/ACT nasal spray Place 2 sprays into both nostrils daily. 16 g 6  . fluticasone-salmeterol (ADVAIR HFA) 115-21 MCG/ACT inhaler Inhale 2 puffs into the lungs 2 (two) times daily. 1 Inhaler 12  . hydrochlorothiazide (HYDRODIURIL) 12.5 MG tablet Take 1 tablet (12.5 mg total) by mouth daily. 90 tablet 1  . Multiple Vitamin (MULTIVITAMIN) tablet Take 1 tablet by mouth daily.    Marland Kitchen omeprazole (PRILOSEC) 20 MG capsule TAKE 1 CAPSULE BY MOUTH DAILY 90 capsule 1  . potassium chloride SA (K-DUR,KLOR-CON) 20 MEQ tablet Take 1 tablet (20 mEq total) by mouth 2 (two) times daily. 180 tablet 1  . pravastatin (PRAVACHOL) 10 MG tablet TAKE 1 TABLET BY MOUTH DAILY 90 tablet  1  . traZODone (DESYREL) 50 MG tablet Take 2 tablets (100 mg total) by mouth at bedtime. 180 tablet 1  . VENTOLIN HFA 108 (90 Base) MCG/ACT inhaler INHALE 2 PUFFS BY MOUTH INTO THE LUNGS EVERY 6 HOURS AS NEEDED FOR WHEEZING 18 g 1  . vitamin B-12 (CYANOCOBALAMIN) 100 MCG tablet Take 100 mcg by mouth daily.    . furosemide (LASIX) 20 MG tablet      No current facility-administered medications for this visit.     OBJECTIVE: There were no vitals filed for this visit.   There is no height or weight on file to calculate BMI.    ECOG FS:1 - Symptomatic but completely ambulatory   (Physical exam completed on January 23, 2019)  General: Well-developed, well-nourished, no acute distress. Eyes: Pink conjunctiva, anicteric sclera. HEENT: Normocephalic, moist mucous membranes, clear oropharnyx. Lungs: Clear to auscultation bilaterally. Heart: Regular rate and rhythm. No rubs, murmurs, or gallops. Abdomen: Soft, nontender, nondistended. No organomegaly noted, normoactive bowel sounds. Musculoskeletal: No edema, cyanosis, or clubbing. Neuro: Alert, answering all questions appropriately. Cranial nerves grossly intact. Skin: No rashes or petechiae noted. Psych: Normal affect.   LAB RESULTS:  Lab Results  Component Value Date   NA 137 01/20/2019   K 4.3 01/12/2019   CL 100 01/12/2019   CO2 25 01/12/2019   GLUCOSE 100 (H) 01/12/2019   BUN 20 01/12/2019   CREATININE 1.19 01/12/2019   CALCIUM 9.1 01/12/2019   PROT 6.4 01/12/2019   ALBUMIN 4.0 01/01/2019   AST 13 01/01/2019   ALT 4 01/01/2019   ALKPHOS 89 01/01/2019   BILITOT 0.3 01/01/2019   GFRNONAA 51 (L) 06/22/2018   GFRAA 59 (L) 06/22/2018    Lab Results  Component Value Date   WBC 8.9 01/06/2019   NEUTROABS 5.8 01/06/2019   HGB 11.1 (L) 01/06/2019   HCT 33.3 (L) 01/06/2019   MCV 88.6 01/06/2019   PLT 257.0 01/06/2019     STUDIES: US Renal  Result Date: 01/16/2019 CLINICAL DATA:  Chronic renal disease. EXAM: RENAL /  URINARY TRACT ULTRASOUND COMPLETE COMPARISON:  None. FINDINGS: Right Kidney: Renal measurements: 9.9 x 4.9 x 4.3 cm = volume: 109 mL . Echogenicity within normal limits. No mass or hydronephrosis  visualized. Left Kidney: Renal measurements: 10.7 x 5.1 x 4.3 cm = volume: 123 mL. Echogenicity within normal limits. No mass or hydronephrosis visualized. Bladder: Appears normal for degree of bladder distention. IMPRESSION: No acute abnormalities. Cortical thickness measures 1 cm on the right and 1.2 cm on the left. Electronically Signed   By: Dorise Bullion III M.D   On: 01/16/2019 17:40    ASSESSMENT: History of colon cancer, concern for recurrence.  PLAN:    1.  History of colon cancer: Patient was last evaluated by Dr. Mike Gip in September 2019.  Her initial diagnosis was in May 2000 where she underwent partial colectomy.  She did not receive chemotherapy at that time.  She then was noted to have a recurrence in her right lung and underwent a right upper lobe lobectomy in August 2008 confirming metastatic colon cancer.  Patient subsequently received 12 cycles of FOLFOX chemotherapy completing in March 2019.  She has had no evidence of recurrence since that time.  CT scan of the chest, abdomen, and pelvis on March 07, 2018 did not reveal any findings concerning with recurrent or progressive disease.  Tumor markers including CEA, CA 125, and CA 19-9 are all within normal limits.  Will repeat imaging later this week.  Patient will then have follow-up virtual visit to discuss the results.  If CT scan remains negative, patient can be discharged from clinic.  If positive, patient will return to clinic in person for additional diagnostic evaluation.  I spent a total of 30 minutes face-to-face with the patient of which greater than 50% of the visit was spent in counseling and coordination of care as detailed above.  Patient expressed understanding and was in agreement with this plan. She also understands that  She can call clinic at any time with any questions, concerns, or complaints.    Lloyd Huger, MD   01/26/2019 1:49 PM

## 2019-01-26 NOTE — Progress Notes (Signed)
Patient is establishing care on her colon cancer. Patient will have her CT Scan done 01/30/2019.

## 2019-01-28 ENCOUNTER — Other Ambulatory Visit: Payer: Self-pay

## 2019-01-29 ENCOUNTER — Encounter: Payer: Self-pay | Admitting: Emergency Medicine

## 2019-01-29 ENCOUNTER — Encounter: Payer: Self-pay | Admitting: Internal Medicine

## 2019-01-29 ENCOUNTER — Other Ambulatory Visit: Payer: Self-pay

## 2019-01-29 ENCOUNTER — Telehealth: Payer: Self-pay

## 2019-01-29 ENCOUNTER — Emergency Department
Admission: EM | Admit: 2019-01-29 | Discharge: 2019-01-29 | Disposition: A | Discharge status: Home / Self Care | Payer: Medicare Other | Attending: Emergency Medicine | Admitting: Emergency Medicine

## 2019-01-29 ENCOUNTER — Ambulatory Visit (INDEPENDENT_AMBULATORY_CARE_PROVIDER_SITE_OTHER): Payer: Medicare Other | Admitting: Internal Medicine

## 2019-01-29 DIAGNOSIS — F419 Anxiety disorder, unspecified: Secondary | ICD-10-CM

## 2019-01-29 DIAGNOSIS — Z7982 Long term (current) use of aspirin: Secondary | ICD-10-CM | POA: Insufficient documentation

## 2019-01-29 DIAGNOSIS — I129 Hypertensive chronic kidney disease with stage 1 through stage 4 chronic kidney disease, or unspecified chronic kidney disease: Secondary | ICD-10-CM | POA: Insufficient documentation

## 2019-01-29 DIAGNOSIS — Z79899 Other long term (current) drug therapy: Secondary | ICD-10-CM | POA: Diagnosis not present

## 2019-01-29 DIAGNOSIS — N183 Chronic kidney disease, stage 3 unspecified: Secondary | ICD-10-CM

## 2019-01-29 DIAGNOSIS — Z881 Allergy status to other antibiotic agents status: Secondary | ICD-10-CM | POA: Insufficient documentation

## 2019-01-29 DIAGNOSIS — J449 Chronic obstructive pulmonary disease, unspecified: Secondary | ICD-10-CM | POA: Diagnosis not present

## 2019-01-29 DIAGNOSIS — F339 Major depressive disorder, recurrent, unspecified: Secondary | ICD-10-CM

## 2019-01-29 DIAGNOSIS — R42 Dizziness and giddiness: Secondary | ICD-10-CM

## 2019-01-29 DIAGNOSIS — Z888 Allergy status to other drugs, medicaments and biological substances status: Secondary | ICD-10-CM | POA: Insufficient documentation

## 2019-01-29 DIAGNOSIS — G20A1 Parkinson's disease without dyskinesia, without mention of fluctuations: Secondary | ICD-10-CM

## 2019-01-29 DIAGNOSIS — E7800 Pure hypercholesterolemia, unspecified: Secondary | ICD-10-CM

## 2019-01-29 DIAGNOSIS — Z85038 Personal history of other malignant neoplasm of large intestine: Secondary | ICD-10-CM | POA: Insufficient documentation

## 2019-01-29 DIAGNOSIS — Z886 Allergy status to analgesic agent status: Secondary | ICD-10-CM | POA: Diagnosis not present

## 2019-01-29 DIAGNOSIS — Z8673 Personal history of transient ischemic attack (TIA), and cerebral infarction without residual deficits: Secondary | ICD-10-CM | POA: Insufficient documentation

## 2019-01-29 DIAGNOSIS — E782 Mixed hyperlipidemia: Secondary | ICD-10-CM | POA: Insufficient documentation

## 2019-01-29 DIAGNOSIS — G20 Parkinson's disease: Secondary | ICD-10-CM | POA: Insufficient documentation

## 2019-01-29 DIAGNOSIS — R739 Hyperglycemia, unspecified: Secondary | ICD-10-CM

## 2019-01-29 DIAGNOSIS — I10 Essential (primary) hypertension: Secondary | ICD-10-CM | POA: Diagnosis not present

## 2019-01-29 DIAGNOSIS — R140 Abdominal distension (gaseous): Secondary | ICD-10-CM

## 2019-01-29 DIAGNOSIS — I773 Arterial fibromuscular dysplasia: Secondary | ICD-10-CM

## 2019-01-29 DIAGNOSIS — M79671 Pain in right foot: Secondary | ICD-10-CM

## 2019-01-29 DIAGNOSIS — M79672 Pain in left foot: Secondary | ICD-10-CM

## 2019-01-29 LAB — URINALYSIS, COMPLETE (UACMP) WITH MICROSCOPIC
Bacteria, UA: NONE SEEN
Bilirubin Urine: NEGATIVE
Glucose, UA: NEGATIVE mg/dL
Hgb urine dipstick: NEGATIVE
Ketones, ur: NEGATIVE mg/dL
Nitrite: NEGATIVE
Protein, ur: NEGATIVE mg/dL
Specific Gravity, Urine: 1.012 (ref 1.005–1.030)
pH: 5 (ref 5.0–8.0)

## 2019-01-29 LAB — CBC
HCT: 35.4 % — ABNORMAL LOW (ref 36.0–46.0)
Hemoglobin: 11.4 g/dL — ABNORMAL LOW (ref 12.0–15.0)
MCH: 28.7 pg (ref 26.0–34.0)
MCHC: 32.2 g/dL (ref 30.0–36.0)
MCV: 89.2 fL (ref 80.0–100.0)
Platelets: 248 10*3/uL (ref 150–400)
RBC: 3.97 MIL/uL (ref 3.87–5.11)
RDW: 12.9 % (ref 11.5–15.5)
WBC: 8.7 10*3/uL (ref 4.0–10.5)
nRBC: 0 % (ref 0.0–0.2)

## 2019-01-29 LAB — BASIC METABOLIC PANEL
Anion gap: 8 (ref 5–15)
BUN: 24 mg/dL — ABNORMAL HIGH (ref 8–23)
CO2: 26 mmol/L (ref 22–32)
Calcium: 9 mg/dL (ref 8.9–10.3)
Chloride: 98 mmol/L (ref 98–111)
Creatinine, Ser: 1.6 mg/dL — ABNORMAL HIGH (ref 0.44–1.00)
GFR calc Af Amer: 38 mL/min — ABNORMAL LOW (ref >60)
GFR calc non Af Amer: 33 mL/min — ABNORMAL LOW (ref >60)
Glucose, Bld: 114 mg/dL — ABNORMAL HIGH (ref 70–99)
Potassium: 3.8 mmol/L (ref 3.5–5.1)
Sodium: 132 mmol/L — ABNORMAL LOW (ref 135–145)

## 2019-01-29 MED ORDER — SODIUM CHLORIDE 0.9 % IV SOLN
1000.0000 mL | Freq: Once | INTRAVENOUS | Status: AC
  Administered 2019-01-29: 14:00:00 1000 mL via INTRAVENOUS

## 2019-01-29 MED ORDER — SODIUM CHLORIDE 0.9% FLUSH
3.0000 mL | Freq: Once | INTRAVENOUS | Status: AC
  Administered 2019-01-29: 14:00:00 3 mL via INTRAVENOUS

## 2019-01-29 NOTE — Assessment & Plan Note (Signed)
On citalopram.  Will taper buspar.  States she stopped it abruptly.  Felt contributed to dizziness.  Instructed on how to taper buspar.

## 2019-01-29 NOTE — ED Triage Notes (Signed)
Pt states for the past few days she has been having intermittent periods of feeling light headed, checked her bp at home and it has been dropping low. Was seen at [facility] and brought here for positive orthostatic bp's. Bp states feeling a little better but still slightly lightheaded. NAD.

## 2019-01-29 NOTE — ED Provider Notes (Addendum)
[facility] Emergency Department Provider Note   ____________________________________________    I have reviewed the triage vital signs and the nursing notes.   HISTORY  Chief Complaint Dizziness     HPI Victoria Hanson is a 68 y.o. female who presents with complaints of dizziness.  Patient reports today she has felt lightheaded multiple times, she has not fallen.  Denies chest pain or shortness of breath.  Went to her PCP who found her to be orthostatic and referred to the emergency department for further evaluation.  Patient is followed by cancer center for history of colon cancer in remission.  Has developed some swelling recently and is scheduled for CT scan in the morning.  Past Medical History:  Diagnosis Date  . Anxiety   . Asthma   . Atypical chest pain    a. 2003 Cath: reportedly nl per pt [name]);  b. 04/2015 [place] Admission, neg troponin.  . Colon cancer ([place]) 1999   a. 1999: Pt had partial colectomy. Did develop lung metastasis. Had right upper lobectomy in 01/2007 then had associated chemotherapy.  Marland Kitchen COPD (chronic obstructive pulmonary disease) ([place])   . DDD (degenerative disc disease), lumbosacral   . Depression   . Echocardiogram abnormal    a. 02/2010 Echo: EF 55-60%, mild LAE, no regional wall motion abnormalities  . Elevated transaminase level    a. ? NASH;  b. 06/2015 - nl LFTs.  . GERD (gastroesophageal reflux disease)   . Hematuria   . Holter monitor, abnormal    a. 12/2009 Holter monitoring: NSR, rare PACs and PVCs.  Marland Kitchen Hypercholesterolemia   . Hyperlipidemia   . Hypertension   . Leaky heart valve    a. Per pt report - no evidence of valvular abnormalities on prior echoes.  . Lesion of skin of Right breast 10/20/2017  . Lung metastases ([place]) 2008   a. S/P resection (lobectomy - right)  . Morbid obesity ([place])   . Nephrolithiasis   . Parkinson disease ([place]) 08/2016   [name]    Patient Active Problem List   Diagnosis  Date Noted  . Abdominal bloating 01/04/2019  . Unsteady gait 12/03/2018  . History of UTI 10/08/2018  . Ear fullness, right 10/08/2018  . B12 deficiency 03/11/2018  . Discharge from right nipple 11/10/2017  . Goals of care, counseling/discussion 10/15/2017  . Parkinson's disease ([place]) 07/31/2017  . Chronic obstructive pulmonary disease ([place]) 07/31/2017  . REM behavioral disorder 05/28/2017  . Elevated liver function tests 08/25/2016  . Carotid stenosis 07/19/2016  . Tremor 06/01/2016  . Osteoporosis 04/08/2016  . Foot pain, bilateral 07/24/2015  . Head pain 07/24/2015  . Hyperlipidemia 06/21/2015  . Other fatigue 06/21/2015  . BMI 33.0-33.9,adult 06/21/2015  . Abnormal mammogram 06/19/2015  . Urinary system disease 03/28/2015  . Hyperglycemia 03/20/2015  . Swelling of lower extremity 11/20/2014  . Dysuria 10/24/2014  . Health care maintenance 07/17/2014  . Hypokalemia 04/05/2013  . TIA (transient ischemic attack) 02/09/2013  . Temporary cerebral vascular dysfunction 02/09/2013  . Anxiety, mild 03/13/2012  . Depression, recurrent ([place]) 03/13/2012  . History of colon cancer 03/13/2012  . Hypertension 03/13/2012  . CKD (chronic kidney disease), stage III ([place]) 03/13/2012  . GERD (gastroesophageal reflux disease) 03/13/2012  . Acid reflux 03/13/2012  . History of malignant neoplasm metastatic to lung 03/13/2012  . Major depressive disorder with single episode 03/13/2012  . Hypercholesterolemia 02/02/2010  . SOB (shortness of breath) 02/02/2010  . Pure hypercholesterolemia 02/02/2010  Past Surgical History:  Procedure Laterality Date  . BREAST EXCISIONAL BIOPSY Right 10/07/2009   benign  . CARDIAC CATHETERIZATION  2003   negative as per pt report  . CHOLECYSTECTOMY  2000  . COLON SURGERY     Colon cancer, removed part of large colon  . COLONOSCOPY N/A 04/25/2015   Procedure: COLONOSCOPY;  Surgeon: Manya Silvas, MD;  Location: [facility] ENDOSCOPY;  Service: Endoscopy;   Laterality: N/A;  . COLONOSCOPY WITH PROPOFOL N/A 06/14/2017   Procedure: COLONOSCOPY WITH PROPOFOL;  Surgeon: Manya Silvas, MD;  Location: [facility] ENDOSCOPY;  Service: Endoscopy;  Laterality: N/A;  . LOBECTOMY  01/2007   Right lung, upper lobe right side removed  . TOTAL ABDOMINAL HYSTERECTOMY     abnormal bleeding  . TUBAL LIGATION  1980    Prior to Admission medications   Medication Sig Start Date End Date Taking? Authorizing Provider  ALPRAZolam Duanne Moron) 0.5 MG tablet 1/2 tablet q day prn 01/01/19   Einar Pheasant, MD  aspirin EC 81 MG tablet Take 81 mg by mouth as needed.     [provider]  busPIRone (BUSPAR) 10 MG tablet Take 1 tablet (10 mg total) by mouth daily. 11/07/18   Einar Pheasant, MD  carbidopa-levodopa (SINEMET IR) 25-100 MG tablet Take 2 tablets by mouth 3 (three) times daily.  07/30/17   [provider]  citalopram (CELEXA) 10 MG tablet Take 1 tablet (10 mg total) by mouth daily. 07/29/18   Einar Pheasant, MD  fluticasone (FLONASE) 50 MCG/ACT nasal spray Place 2 sprays into both nostrils daily. 07/03/18   Jodelle Green, FNP  fluticasone-salmeterol (ADVAIR HFA) 277-82 MCG/ACT inhaler Inhale 2 puffs into the lungs 2 (two) times daily. 08/28/18   Wilhelmina Mcardle, MD  furosemide (LASIX) 20 MG tablet  12/22/18   [provider]  hydrochlorothiazide (HYDRODIURIL) 12.5 MG tablet Take 1 tablet (12.5 mg total) by mouth daily. 07/29/18   Einar Pheasant, MD  Multiple Vitamin (MULTIVITAMIN) tablet Take 1 tablet by mouth daily.    [provider]  omeprazole (PRILOSEC) 20 MG capsule TAKE 1 CAPSULE BY MOUTH DAILY 12/24/18   Einar Pheasant, MD  potassium chloride SA (K-DUR,KLOR-CON) 20 MEQ tablet Take 1 tablet (20 mEq total) by mouth 2 (two) times daily. 07/29/18   Einar Pheasant, MD  pravastatin (PRAVACHOL) 10 MG tablet TAKE 1 TABLET BY MOUTH DAILY 12/22/18   Einar Pheasant, MD  traZODone (DESYREL) 50 MG tablet Take 2 tablets (100 mg total) by mouth at  bedtime. 07/29/18   Einar Pheasant, MD  VENTOLIN HFA 108 (90 Base) MCG/ACT inhaler INHALE 2 PUFFS BY MOUTH INTO THE LUNGS EVERY 6 HOURS AS NEEDED FOR WHEEZING 11/25/18   Einar Pheasant, MD  vitamin B-12 (CYANOCOBALAMIN) 100 MCG tablet Take 100 mcg by mouth daily.    [provider]     Allergies Macrobid [nitrofurantoin macrocrystal]; Antihistamines, diphenhydramine-type; Aspirin; Ceftin [cefuroxime axetil]; Celecoxib; Cymbalta [duloxetine hcl]; Diphenhydramine; Escitalopram; Lexapro [escitalopram oxalate]; Metronidazole; Nitrofurantoin; Zoloft [sertraline hcl]; and Ciprofloxacin  Family History  Problem Relation Age of Onset  . Coronary artery disease Mother        died @ 53 of MI.  . Mental illness Mother   . Diabetes Mother   . Heart attack Father 11       MI  . Cancer Father        lung - died in his mid-70's  . Hypertension Father   . Diabetes Father   . Sudden death Brother   .  Mental illness Brother   . Heart attack Brother        MI @ 88, died @ age 54.  . Breast cancer Neg Hx     Social History Social History   Tobacco Use  . Smoking status: Passive Smoke Exposure - Never Smoker  . Smokeless tobacco: Never Used  . Tobacco comment: husband and son smoked in her home.  Substance Use Topics  . Alcohol use: Yes    Alcohol/week: 1.0 standard drinks    Types: 1 Glasses of wine per week    Comment: rare wine cooler.  . Drug use: No    Review of Systems  Constitutional: No fever/chills Eyes: No visual changes.  ENT: No sore throat. Cardiovascular: Denies chest pain. Respiratory: Denies shortness of breath. Gastrointestinal: No abdominal pain.   Genitourinary: Negative for dysuria. Musculoskeletal: Negative for back pain. Skin: Negative for rash. Neurological: Negative for headaches    ____________________________________________   PHYSICAL EXAM:  VITAL SIGNS: ED Triage Vitals  Enc Vitals Group     BP 01/29/19 1238 134/61     Pulse Rate  01/29/19 1238 71     Resp 01/29/19 1238 18     Temp 01/29/19 1238 97.9 F (36.6 C)     Temp Source 01/29/19 1238 Oral     SpO2 01/29/19 1238 97 %     Weight 01/29/19 1239 93.9 kg (207 lb)     Height 01/29/19 1239 1.6 m (5\' 3" )     Head Circumference --      Peak Flow --      Pain Score 01/29/19 1239 0     Pain Loc --      Pain Edu? --      Excl. in [place]? --     Constitutional: Alert and oriented. Eyes: Conjunctivae are normal.   Nose: No congestion/rhinnorhea. Mouth/Throat: Mucous membranes are moist.    Cardiovascular: Normal rate, regular rhythm.  Good peripheral circulation. Respiratory: Normal respiratory effort.  No retractions. Lungs CTAB. Gastrointestinal: Soft and nontender. No distention.  No CVA tenderness.  Musculoskeletal: No lower extremity tenderness nor edema.  Warm and well perfused Neurologic:  Normal speech and language. No gross focal neurologic deficits are appreciated.  Skin:  Skin is warm, dry and intact. No rash noted. Psychiatric: Mood and affect are normal. Speech and behavior are normal.  ____________________________________________   LABS (all labs ordered are listed, but only abnormal results are displayed)  Labs Reviewed  BASIC METABOLIC PANEL - Abnormal; Notable for the following components:      Result Value   Sodium 132 (*)    Glucose, Bld 114 (*)    BUN 24 (*)    Creatinine, Ser 1.60 (*)    GFR calc non Af Amer 33 (*)    GFR calc Af Amer 38 (*)    All other components within normal limits  CBC - Abnormal; Notable for the following components:   Hemoglobin 11.4 (*)    HCT 35.4 (*)    All other components within normal limits  URINALYSIS, COMPLETE (UACMP) WITH MICROSCOPIC - Abnormal; Notable for the following components:   Color, Urine YELLOW (*)    APPearance HAZY (*)    Leukocytes,Ua MODERATE (*)    All other components within normal limits  CBG MONITORING, ED   ____________________________________________  EKG  ED ECG REPORT  I, Lavonia Drafts, the attending physician, personally viewed and interpreted this ECG.  Date: 01/29/2019  Rhythm: normal sinus rhythm QRS Axis: normal Intervals:  normal ST/T Wave abnormalities: Nonspecific T wave abnormality Narrative Interpretation: no evidence of acute ischemia  ____________________________________________  RADIOLOGY  None ____________________________________________   PROCEDURES  Procedure(s) performed: No  Procedures   Critical Care performed: No ____________________________________________   INITIAL IMPRESSION / ASSESSMENT AND PLAN / ED COURSE  Pertinent labs & imaging results that were available during my care of the patient were reviewed by me and considered in my medical decision making (see chart for details).  Patient well-appearing in no acute distress.  Vital signs here are reassuring.  Lab work significant for mild elevation of BUN and creatinine which may be the cause of her dizziness.  Will give IV fluids, recheck orthostatics  I have asked my colleague to reevaluate after IV fluids  Clinical Course as of Feb 01 20  Thu Jan 29, 2019  1633 Creatinine(!): 1.60 [MF]  1658 Hemoglobin(!): 11.4 [MF]    Clinical Course User Index [MF] Vanessa [place], MD   ____________________________________________   FINAL CLINICAL IMPRESSION(S) / ED DIAGNOSES  Final diagnoses:  Dizziness        Note:  This document was prepared using Dragon voice recognition software and may include unintentional dictation errors.   Lavonia Drafts, MD 01/29/19 1516    Lavonia Drafts, MD 02/01/19 [phone]

## 2019-01-29 NOTE — Discharge Instructions (Signed)
Your work-up was reassuring.  We gave you some fluids for some low blood pressure.  This also be secondary to your Parkinson's.  He should follow-up with your neurologist.  Return to the ER for any other concerns

## 2019-01-29 NOTE — Assessment & Plan Note (Signed)
Saw Dr Alva Garnet.  Breathing stable.

## 2019-01-29 NOTE — Telephone Encounter (Signed)
Patient called in stating that she had a virtual visit this morning w/ Dr. Nicki Reaper.  Patient said that she had just checked her blood pressure and the reading was 94/54 or 94/59.  Patient c/o feeling dizzy and as if she was going to pass out.  I had pt recheck her bp while on the phone.  Pt reported reading as 92/60 and hr 77.  Pt said that her bp reading was also low yesterday and she felt dizzy on yesterday as well.  Pt said that she spoke to Dr. Nicki Reaper this morning about dizziness and was advised that it may be from pt discontinuing buspar.  Pt said she is tapering off of buspar and only took 1/2 pill today and thought it may help but pt c/o of still feeling dizzy.  Pt doesn't feel dizziness is coming from buspar.  Patient said that she has not taken her blood pressure med today.  Patient denied having any other symptoms.  Consulted w/ Dr. Bary Leriche nurse about pt bp and symptoms.  Advised pt to go to [facility] for symptoms to be evaluated today.  Pt agreed.  Pt daughter was with pt.  Pt's daughter will drive pt to [facility].  Patient going now.

## 2019-01-29 NOTE — Assessment & Plan Note (Signed)
Decreased recently.  Stopped lasix.  Only on hctz now. Last kidney function improved and back to her baseline.  Follow metabolic panel.  Discussed staying off lasix.

## 2019-01-29 NOTE — ED Provider Notes (Signed)
3:07 PM Assumed care for off going team.   Blood pressure 134/61, pulse 71, temperature 97.9 F (36.6 C), temperature source Oral, resp. rate 18, height 5\' 3"  (1.6 m), weight 93.9 kg, SpO2 97 %.  See their HPI for full report but in brief   Intermittent dizziness, orthostatic +, getting fluid and get repeat orthostatics. Hopefully d/c home.   Patient feels better after 1 L fluid.  Patient has ambulated around twice now without any recurrent symptoms.  Her hemoglobin is at baseline.  Urine w/out evidence of UTI.  At this time patient feels comfortable with being discharged home.     Vanessa Silverhill, MD 01/29/19 [phone]

## 2019-01-29 NOTE — Progress Notes (Signed)
Virtual Visit via telephone Note  This visit type was conducted due to national recommendations for restrictions regarding the COVID-19 pandemic (e.g. social distancing).  This format is felt to be most appropriate for this patient at this time.  All issues noted in this document were discussed and addressed.  No physical exam was performed (except for noted visual exam findings with Video Visits).   I connected with Victoria Hanson by telephone and verified that I am speaking with the correct person using two identifiers. Location patient: home Location provider: work Persons participating in the telephone visit: patient, provider  I discussed the limitations, risks, security and privacy concerns of performing an evaluation and management service by telephone and the availability of in person appointments. The patient expressed understanding and agreed to proceed.   Reason for visit: follow up appt  HPI: She reports she is doing relatively well.  Her main complaint is pain - poeterior heel.  States she notices more pain when she first gets up in the am.  If she walks with support shoes, pain is better.  Able to walk in her support shoes without significant problems.  She was just evaluated by oncology.  Planning for CT scan tomorrow.  Having persistent intermittent diarrhea and bloating.  Also has seen GI recently.  hgb decreased.  Iron stores (low normal range).  She is taking multivitamin with iron daily.  GI gave her hemoccult cards.  She returned these yesterday.  Denies any chest pain.  No problems reported with her breathing.  She is eating.  She does report that yesterday she experienced some dizziness.  She states she was told to come off her buspar.  She stopped in abruptly and was questioning if this could have contributed to her dizziness.  Denies any dizziness today.  Discussed staying hydrated.  She is off lasix.  Last kidney function checked - improved.  Still taking hctz.  No  diarrhea today.  Seeing neurology for f/u parkinsons.  Taking trazodone to help with sleep.     ROS: See pertinent positives and negatives per HPI.  Past Medical History:  Diagnosis Date  . Anxiety   . Asthma   . Atypical chest pain    a. 2003 Cath: reportedly nl per pt [name]);  b. 04/2015 [facility] Admission, neg troponin.  . Colon cancer ([place]) 1999   a. 1999: Pt had partial colectomy. Did develop lung metastasis. Had right upper lobectomy in 01/2007 then had associated chemotherapy.  Marland Kitchen COPD (chronic obstructive pulmonary disease) ([place])   . DDD (degenerative disc disease), lumbosacral   . Depression   . Echocardiogram abnormal    a. 02/2010 Echo: EF 55-60%, mild LAE, no regional wall motion abnormalities  . Elevated transaminase level    a. ? NASH;  b. 06/2015 - nl LFTs.  . GERD (gastroesophageal reflux disease)   . Hematuria   . Holter monitor, abnormal    a. 12/2009 Holter monitoring: NSR, rare PACs and PVCs.  Marland Kitchen Hypercholesterolemia   . Hyperlipidemia   . Hypertension   . Leaky heart valve    a. Per pt report - no evidence of valvular abnormalities on prior echoes.  . Lesion of skin of Right breast 10/20/2017  . Lung metastases ([place]) 2008   a. S/P resection (lobectomy - right)  . Morbid obesity ([place])   . Nephrolithiasis   . Parkinson disease ([place]) 08/2016   [name]    Past Surgical History:  Procedure Laterality Date  . BREAST EXCISIONAL BIOPSY  Right 10/07/2009   benign  . CARDIAC CATHETERIZATION  2003   negative as per pt report  . CHOLECYSTECTOMY  2000  . COLON SURGERY     Colon cancer, removed part of large colon  . COLONOSCOPY N/A 04/25/2015   Procedure: COLONOSCOPY;  Surgeon: Manya Silvas, MD;  Location: [facility] ENDOSCOPY;  Service: Endoscopy;  Laterality: N/A;  . COLONOSCOPY WITH PROPOFOL N/A 06/14/2017   Procedure: COLONOSCOPY WITH PROPOFOL;  Surgeon: Manya Silvas, MD;  Location: [facility] ENDOSCOPY;  Service: Endoscopy;  Laterality: N/A;  . LOBECTOMY  01/2007    Right lung, upper lobe right side removed  . TOTAL ABDOMINAL HYSTERECTOMY     abnormal bleeding  . TUBAL LIGATION  1980    Family History  Problem Relation Age of Onset  . Coronary artery disease Mother        died @ 54 of MI.  . Mental illness Mother   . Diabetes Mother   . Heart attack Father 71       MI  . Cancer Father        lung - died in his mid-70's  . Hypertension Father   . Diabetes Father   . Sudden death Brother   . Mental illness Brother   . Heart attack Brother        MI @ 35, died @ age 91.  . Breast cancer Neg Hx     SOCIAL HX: reviewed.    Current Outpatient Medications:  .  ALPRAZolam (XANAX) 0.5 MG tablet, 1/2 tablet q day prn, Disp: 30 tablet, Rfl: 0 .  aspirin EC 81 MG tablet, Take 81 mg by mouth as needed. , Disp: , Rfl:  .  busPIRone (BUSPAR) 10 MG tablet, Take 1 tablet (10 mg total) by mouth daily., Disp: 30 tablet, Rfl: 1 .  carbidopa-levodopa (SINEMET IR) 25-100 MG tablet, Take 2 tablets by mouth 3 (three) times daily. , Disp: , Rfl:  .  citalopram (CELEXA) 10 MG tablet, Take 1 tablet (10 mg total) by mouth daily., Disp: 90 tablet, Rfl: 1 .  fluticasone (FLONASE) 50 MCG/ACT nasal spray, Place 2 sprays into both nostrils daily., Disp: 16 g, Rfl: 6 .  fluticasone-salmeterol (ADVAIR HFA) 115-21 MCG/ACT inhaler, Inhale 2 puffs into the lungs 2 (two) times daily., Disp: 1 Inhaler, Rfl: 12 .  furosemide (LASIX) 20 MG tablet, , Disp: , Rfl:  .  hydrochlorothiazide (HYDRODIURIL) 12.5 MG tablet, Take 1 tablet (12.5 mg total) by mouth daily., Disp: 90 tablet, Rfl: 1 .  Multiple Vitamin (MULTIVITAMIN) tablet, Take 1 tablet by mouth daily., Disp: , Rfl:  .  omeprazole (PRILOSEC) 20 MG capsule, TAKE 1 CAPSULE BY MOUTH DAILY, Disp: 90 capsule, Rfl: 1 .  potassium chloride SA (K-DUR,KLOR-CON) 20 MEQ tablet, Take 1 tablet (20 mEq total) by mouth 2 (two) times daily., Disp: 180 tablet, Rfl: 1 .  pravastatin (PRAVACHOL) 10 MG tablet, TAKE 1 TABLET BY MOUTH DAILY,  Disp: 90 tablet, Rfl: 1 .  traZODone (DESYREL) 50 MG tablet, Take 2 tablets (100 mg total) by mouth at bedtime., Disp: 180 tablet, Rfl: 1 .  VENTOLIN HFA 108 (90 Base) MCG/ACT inhaler, INHALE 2 PUFFS BY MOUTH INTO THE LUNGS EVERY 6 HOURS AS NEEDED FOR WHEEZING, Disp: 18 g, Rfl: 1 .  vitamin B-12 (CYANOCOBALAMIN) 100 MCG tablet, Take 100 mcg by mouth daily., Disp: , Rfl:   EXAM:  GENERAL: alert, oriented.  Sounds to be in no acute distress.  Answering questions appropriately.   PSYCH/NEURO:  pleasant and cooperative, no obvious depression or anxiety, speech and thought processing grossly intact  ASSESSMENT AND PLAN:  Discussed the following assessment and plan:  Abdominal bloating Reports intermittent diarrhea and abdominal bloating.  Saw oncology yesterday.  Planning for CT tomorrow.  No diarrhea today.  Has seen GI.  W/up for anemia.  Just returned hemoccult cards yesterday.    Anxiety, mild On citalopram.  Will taper buspar.  States she stopped it abruptly.  Felt contributed to dizziness.  Instructed on how to taper buspar.    Chronic obstructive pulmonary disease (HCC) Saw Dr Alva Garnet.  Breathing stable.    CKD (chronic kidney disease), stage III Decreased recently.  Stopped lasix.  Only on hctz now. Last kidney function improved and back to her baseline.  Follow metabolic panel.  Discussed staying off lasix.    Depression, recurrent ([place]) On citalopram.  Stable.  Follow.    Foot pain, bilateral With right posterior heel pain now.  Support shoes help.  Follow.    GERD (gastroesophageal reflux disease) Controlled.    Hypercholesterolemia Did not tolerate crestor.  Low cholesterol diet and exercise.  Follow lipid panel.    Hyperglycemia Low carb diet and exercise.  Follow met b and a1c.   Hypertension Blood pressure has been under good control.  Remain off lasix.  Kidney function improved last check here.  Follow.    Parkinson's disease ([place]) Followed by neurology.     Fibromuscular dysplasia ([place]) Saw [facility] regarding carotid stenosis.  Recommended f/u prn.      I discussed the assessment and treatment plan with the patient. The patient was provided an opportunity to ask questions and all were answered. The patient agreed with the plan and demonstrated an understanding of the instructions.   The patient was advised to call back or seek an in-person evaluation if the symptoms worsen or if the condition fails to improve as anticipated.  I provided 25 minutes of non-face-to-face time during this encounter.   Einar Pheasant, MD

## 2019-01-29 NOTE — Telephone Encounter (Signed)
FYI pt going to urgent care

## 2019-01-29 NOTE — Assessment & Plan Note (Signed)
Reports intermittent diarrhea and abdominal bloating.  Saw oncology yesterday.  Planning for CT tomorrow.  No diarrhea today.  Has seen GI.  W/up for anemia.  Just returned hemoccult cards yesterday.

## 2019-01-29 NOTE — Telephone Encounter (Signed)
Pt evaluated.

## 2019-01-30 ENCOUNTER — Ambulatory Visit
Admission: RE | Admit: 2019-01-30 | Discharge: 2019-01-30 | Disposition: A | Discharge status: Home / Self Care | Payer: Medicare Other | Source: Ambulatory Visit | Attending: Oncology | Admitting: Oncology

## 2019-01-30 ENCOUNTER — Other Ambulatory Visit: Payer: Self-pay

## 2019-01-30 DIAGNOSIS — Z85038 Personal history of other malignant neoplasm of large intestine: Secondary | ICD-10-CM | POA: Diagnosis not present

## 2019-01-30 DIAGNOSIS — K439 Ventral hernia without obstruction or gangrene: Secondary | ICD-10-CM | POA: Diagnosis not present

## 2019-01-30 LAB — POCT I-STAT CREATININE: Creatinine, Ser: 1.3 mg/dL — ABNORMAL HIGH (ref 0.44–1.00)

## 2019-01-30 MED ORDER — IOHEXOL 300 MG/ML  SOLN
75.0000 mL | Freq: Once | INTRAMUSCULAR | Status: AC | PRN
  Administered 2019-01-30: 75 mL via INTRAVENOUS

## 2019-01-30 NOTE — Progress Notes (Signed)
[place]  [phone]) [phone] [phone]) [phone]  ID: DESHIA VANDERHOOF OB: [birth year]  MR#: [id number]  KKX#:[id number]  Patient Care Team: Einar Pheasant, MD as PCP - General (Unknown Physician Specialty) Manya Silvas, MD (Gastroenterology)  I connected with [name] on 02/02/19 at  9:45 AM EDT by telephone visit and verified that I am speaking with the correct person using two identifiers.   I discussed the limitations, risks, security and privacy concerns of performing an evaluation and management service by telemedicine and the availability of in-person appointments. I also discussed with the patient that there may be a patient responsible charge related to this service. The patient expressed understanding and agreed to proceed.   Other persons participating in the visit and their role in the encounter: Patient, MD  Patients location: Home Providers location: Clinic   CHIEF COMPLAINT: History of colon cancer, concern for recurrence.  INTERVAL HISTORY: Patient agreed to follow-up of her imaging results via telephone visit.  She continues to have chronic diarrhea, but this is significantly improved.  She otherwise feels well.   She has no neurologic complaints.  She denies any recent fevers or illnesses.  She has a fair appetite, but denies weight loss.  She has no chest pain, shortness of breath, cough, or hemoptysis.  She denies any nausea, vomiting, or constipation.  She has no melena or hematochezia.  She has no urinary complaints.  Patient offers no further specific complaints today.   REVIEW OF SYSTEMS:   Review of Systems  Constitutional: Negative.  Negative for fever, malaise/fatigue and weight loss.  Respiratory: Negative.  Negative for cough and hemoptysis.   Cardiovascular: Negative.  Negative for chest pain and leg swelling.  Gastrointestinal: Positive for diarrhea. Negative for abdominal pain, blood in stool, constipation, melena,  nausea and vomiting.  Genitourinary: Negative.  Negative for dysuria.  Musculoskeletal: Negative.   Skin: Negative.  Negative for rash.  Neurological: Negative.  Negative for dizziness, focal weakness, weakness and headaches.  Psychiatric/Behavioral: The patient is nervous/anxious.     As per HPI. Otherwise, a complete review of systems is negative.  PAST MEDICAL HISTORY: Past Medical History:  Diagnosis Date   Anxiety    Asthma    Atypical chest pain    a. 2003 Cath: reportedly nl per pt [name]);  b. 04/2015 [facility] Admission, neg troponin.   Colon cancer ([place]) 1999   a. 1999: Pt had partial colectomy. Did develop lung metastasis. Had right upper lobectomy in 01/2007 then had associated chemotherapy.   COPD (chronic obstructive pulmonary disease) (HCC)    DDD (degenerative disc disease), lumbosacral    Depression    Echocardiogram abnormal    a. 02/2010 Echo: EF 55-60%, mild LAE, no regional wall motion abnormalities   Elevated transaminase level    a. ? NASH;  b. 06/2015 - nl LFTs.   GERD (gastroesophageal reflux disease)    Hematuria    Holter monitor, abnormal    a. 12/2009 Holter monitoring: NSR, rare PACs and PVCs.   Hypercholesterolemia    Hyperlipidemia    Hypertension    Leaky heart valve    a. Per pt report - no evidence of valvular abnormalities on prior echoes.   Lesion of skin of Right breast 10/20/2017   Lung metastases ([place]) 2008   a. S/P resection (lobectomy - right)   Morbid obesity ([place])    Nephrolithiasis    Parkinson disease ([place]) 08/2016   [name]    PAST SURGICAL HISTORY: Past  Surgical History:  Procedure Laterality Date   BREAST EXCISIONAL BIOPSY Right 10/07/2009   benign   CARDIAC CATHETERIZATION  2003   negative as per pt report   CHOLECYSTECTOMY  2000   COLON SURGERY     Colon cancer, removed part of large colon   COLONOSCOPY N/A 04/25/2015   Procedure: COLONOSCOPY;  Surgeon: Manya Silvas, MD;  Location: [facility];  Service: Endoscopy;  Laterality: N/A;   COLONOSCOPY WITH PROPOFOL N/A 06/14/2017   Procedure: COLONOSCOPY WITH PROPOFOL;  Surgeon: Manya Silvas, MD;  Location: [facility] ENDOSCOPY;  Service: Endoscopy;  Laterality: N/A;   LOBECTOMY  01/2007   Right lung, upper lobe right side removed   TOTAL ABDOMINAL HYSTERECTOMY     abnormal bleeding   TUBAL LIGATION  1980    FAMILY HISTORY: Family History  Problem Relation Age of Onset   Coronary artery disease Mother        died @ 70 of MI.   Mental illness Mother    Diabetes Mother    Heart attack Father 71       MI   Cancer Father        lung - died in his mid-70's   Hypertension Father    Diabetes Father    Sudden death Brother    Mental illness Brother    Heart attack Brother        MI @ 25, died @ age 75.   Breast cancer Neg Hx     ADVANCED DIRECTIVES (Y/N):  N  HEALTH MAINTENANCE: Social History   Tobacco Use   Smoking status: Passive Smoke Exposure - Never Smoker   Smokeless tobacco: Never Used   Tobacco comment: husband and son smoked in her home.  Substance Use Topics   Alcohol use: Yes    Alcohol/week: 1.0 standard drinks    Types: 1 Glasses of wine per week    Comment: rare wine cooler.   Drug use: No     Colonoscopy:  PAP:  Bone density:  Lipid panel:  Allergies  Allergen Reactions   Macrobid [Nitrofurantoin Macrocrystal] Shortness Of Breath and Palpitations   Antihistamines, Diphenhydramine-Type Other (See Comments)    She does not know which antihistamine she has an allergic reaction to.   Aspirin Other (See Comments)    Makes her sick on the stomach if she takes too many and causes bleeding.  Of note, she can take ibuprofen. Other reaction(s): Other (See Comments) Makes her sick on the stomach.Of note, she can take ibuprofen. Makes her sick on the stomach. Of note, she can take ibuprofen.   Ceftin [Cefuroxime Axetil] Other (See Comments)    Chest pains   Celecoxib  Other (See Comments)    Reaction: Blood Disorder GI bleeding.   Cymbalta [Duloxetine Hcl] Other (See Comments)    Reaction: unknown   Diphenhydramine Other (See Comments)   Escitalopram Other (See Comments)   Lexapro [Escitalopram Oxalate] Other (See Comments)    Reaction: Difficulty with speech   Metronidazole Other (See Comments)    Other reaction(s): Unknown   Nitrofurantoin Other (See Comments)   Zoloft [Sertraline Hcl]    Ciprofloxacin Other (See Comments)    Reaction: unknown    Current Outpatient Medications  Medication Sig Dispense Refill   ALPRAZolam (XANAX) 0.5 MG tablet 1/2 tablet q day prn 30 tablet 0   aspirin EC 81 MG tablet Take 81 mg by mouth as needed.      busPIRone (BUSPAR) 10 MG tablet Take 1  tablet (10 mg total) by mouth daily. 30 tablet 1   carbidopa-levodopa (SINEMET IR) 25-100 MG tablet Take 2 tablets by mouth 3 (three) times daily.      citalopram (CELEXA) 10 MG tablet Take 1 tablet (10 mg total) by mouth daily. 90 tablet 1   fluticasone (FLONASE) 50 MCG/ACT nasal spray Place 2 sprays into both nostrils daily. 16 g 6   fluticasone-salmeterol (ADVAIR HFA) 115-21 MCG/ACT inhaler Inhale 2 puffs into the lungs 2 (two) times daily. 1 Inhaler 12   furosemide (LASIX) 20 MG tablet      hydrochlorothiazide (HYDRODIURIL) 12.5 MG tablet Take 1 tablet (12.5 mg total) by mouth daily. 90 tablet 1   Multiple Vitamin (MULTIVITAMIN) tablet Take 1 tablet by mouth daily.     omeprazole (PRILOSEC) 20 MG capsule TAKE 1 CAPSULE BY MOUTH DAILY 90 capsule 1   potassium chloride SA (K-DUR,KLOR-CON) 20 MEQ tablet Take 1 tablet (20 mEq total) by mouth 2 (two) times daily. 180 tablet 1   pravastatin (PRAVACHOL) 10 MG tablet TAKE 1 TABLET BY MOUTH DAILY 90 tablet 1   traZODone (DESYREL) 50 MG tablet Take 2 tablets (100 mg total) by mouth at bedtime. 180 tablet 1   VENTOLIN HFA 108 (90 Base) MCG/ACT inhaler INHALE 2 PUFFS BY MOUTH INTO THE LUNGS EVERY 6 HOURS AS  NEEDED FOR WHEEZING 18 g 1   vitamin B-12 (CYANOCOBALAMIN) 100 MCG tablet Take 100 mcg by mouth daily.     No current facility-administered medications for this visit.     OBJECTIVE: There were no vitals filed for this visit.   There is no height or weight on file to calculate BMI.    ECOG FS:1 - Symptomatic but completely ambulatory    LAB RESULTS:  Lab Results  Component Value Date   NA 132 (L) 01/29/2019   K 3.8 01/29/2019   CL 98 01/29/2019   CO2 26 01/29/2019   GLUCOSE 114 (H) 01/29/2019   BUN 24 (H) 01/29/2019   CREATININE 1.30 (H) 01/30/2019   CALCIUM 9.0 01/29/2019   PROT 6.4 01/12/2019   ALBUMIN 4.0 01/01/2019   AST 13 01/01/2019   ALT 4 01/01/2019   ALKPHOS 89 01/01/2019   BILITOT 0.3 01/01/2019   GFRNONAA 33 (L) 01/29/2019   GFRAA 38 (L) 01/29/2019    Lab Results  Component Value Date   WBC 8.7 01/29/2019   NEUTROABS 5.8 01/06/2019   HGB 11.4 (L) 01/29/2019   HCT 35.4 (L) 01/29/2019   MCV 89.2 01/29/2019   PLT 248 01/29/2019     STUDIES: Ct Abdomen Pelvis W Contrast  Result Date: 01/30/2019 CLINICAL DATA:  Abdominal pain, swelling, and change in bowel habits. Personal history of metastatic colon carcinoma. EXAM: CT ABDOMEN AND PELVIS WITH CONTRAST TECHNIQUE: Multidetector CT imaging of the abdomen and pelvis was performed using the standard protocol following bolus administration of intravenous contrast. CONTRAST:  64mL OMNIPAQUE IOHEXOL 300 MG/ML  SOLN COMPARISON:  03/07/2018 FINDINGS: Lower Chest: No acute findings. Hepatobiliary: No hepatic masses identified. Prior cholecystectomy. No evidence of biliary obstruction. Pancreas:  No mass or inflammatory changes. Spleen: Within normal limits in size. Tiny sub-cm low-attenuation lesions too small to characterize but most likely represents a tiny cyst. Adrenals/Urinary Tract: No masses identified. No evidence of hydronephrosis. Stomach/Bowel: No evidence of obstruction, inflammatory process or abnormal fluid  collections. No masses identified. Normal appendix visualized. Moderate to large amount of stool is seen throughout the colon, similar to previous exam. Vascular/Lymphatic: No pathologically enlarged lymph nodes.  No abdominal aortic aneurysm. Aortic atherosclerosis. Reproductive: Prior hysterectomy noted. Adnexal regions are unremarkable in appearance. Other: Stable tiny paraumbilical ventral hernia, which contains only fat. Small suprapubic ventral hernia which contains only fat is also stable. Musculoskeletal:  No suspicious bone lesions identified. IMPRESSION: 1. No acute findings. No evidence of recurrent or metastatic carcinoma within the abdomen or pelvis. 2. Stable small fat-containing ventral hernias. Aortic Atherosclerosis (ICD10-I70.0). Electronically Signed   By: Marlaine Hind M.D.   On: 01/30/2019 09:51   US Renal  Result Date: 01/16/2019 CLINICAL DATA:  Chronic renal disease. EXAM: RENAL / URINARY TRACT ULTRASOUND COMPLETE COMPARISON:  None. FINDINGS: Right Kidney: Renal measurements: 9.9 x 4.9 x 4.3 cm = volume: 109 mL . Echogenicity within normal limits. No mass or hydronephrosis visualized. Left Kidney: Renal measurements: 10.7 x 5.1 x 4.3 cm = volume: 123 mL. Echogenicity within normal limits. No mass or hydronephrosis visualized. Bladder: Appears normal for degree of bladder distention. IMPRESSION: No acute abnormalities. Cortical thickness measures 1 cm on the right and 1.2 cm on the left. Electronically Signed   By: Dorise Bullion III M.D   On: 01/16/2019 17:40    ASSESSMENT: History of colon cancer, concern for recurrence.  PLAN:    1.  History of colon cancer: Patient was last evaluated by Dr. Mike Gip in September 2019.  Her initial diagnosis was in May 2000 where she underwent partial colectomy.  She did not receive chemotherapy at that time.  She then was noted to have a recurrence in her right lung and underwent a right upper lobe lobectomy in August 2008 confirming metastatic  colon cancer.  Patient subsequently received 12 cycles of FOLFOX chemotherapy completing in March 2009.  Tumor markers including CEA, CA 125, and CA 19-9 are all within normal limits.  CT scan results reviewed independently and reported as above with no obvious evidence of recurrent or progressive disease.  No intervention is needed.  After lengthy discussion with the patient, it was agreed upon that no further follow-up is necessary.  Please refer patient back if there are any questions or concerns.   2.  Diarrhea: Continue follow-up with GI as indicated.  I provided 15 minutes of non face-to-face telephone visit time during this encounter, and > 50% was spent counseling as documented under my assessment & plan.  Patient expressed understanding and was in agreement with this plan. She also understands that She can call clinic at any time with any questions, concerns, or complaints.    Lloyd Huger, MD   02/02/2019 2:54 PM

## 2019-01-31 ENCOUNTER — Other Ambulatory Visit: Payer: Self-pay | Admitting: Internal Medicine

## 2019-02-02 ENCOUNTER — Inpatient Hospital Stay (HOSPITAL_BASED_OUTPATIENT_CLINIC_OR_DEPARTMENT_OTHER): Payer: Medicare Other | Admitting: Oncology

## 2019-02-02 ENCOUNTER — Other Ambulatory Visit: Payer: Self-pay | Admitting: Internal Medicine

## 2019-02-02 ENCOUNTER — Encounter: Payer: Self-pay | Admitting: Internal Medicine

## 2019-02-02 DIAGNOSIS — Z85118 Personal history of other malignant neoplasm of bronchus and lung: Secondary | ICD-10-CM

## 2019-02-02 DIAGNOSIS — I773 Arterial fibromuscular dysplasia: Secondary | ICD-10-CM | POA: Insufficient documentation

## 2019-02-02 DIAGNOSIS — F419 Anxiety disorder, unspecified: Secondary | ICD-10-CM | POA: Diagnosis not present

## 2019-02-02 DIAGNOSIS — Z85038 Personal history of other malignant neoplasm of large intestine: Secondary | ICD-10-CM

## 2019-02-02 DIAGNOSIS — Z1231 Encounter for screening mammogram for malignant neoplasm of breast: Secondary | ICD-10-CM

## 2019-02-02 DIAGNOSIS — Z7982 Long term (current) use of aspirin: Secondary | ICD-10-CM | POA: Diagnosis not present

## 2019-02-02 DIAGNOSIS — R197 Diarrhea, unspecified: Secondary | ICD-10-CM | POA: Diagnosis not present

## 2019-02-02 DIAGNOSIS — Z8589 Personal history of malignant neoplasm of other organs and systems: Secondary | ICD-10-CM

## 2019-02-02 DIAGNOSIS — Z79899 Other long term (current) drug therapy: Secondary | ICD-10-CM

## 2019-02-02 NOTE — Assessment & Plan Note (Signed)
Controlled.  

## 2019-02-02 NOTE — Assessment & Plan Note (Signed)
With right posterior heel pain now.  Support shoes help.  Follow.

## 2019-02-02 NOTE — Assessment & Plan Note (Signed)
Saw [facility] regarding carotid stenosis.  Recommended f/u prn.

## 2019-02-02 NOTE — Assessment & Plan Note (Signed)
Low carb diet and exercise.  Follow met b and a1c.  

## 2019-02-02 NOTE — Progress Notes (Signed)
Patient is doing well complains of pain in her right foot, has a knot but will see PCP for this. Would like to know results of scan.

## 2019-02-02 NOTE — Assessment & Plan Note (Signed)
On citalopram.  Stable.  Follow.

## 2019-02-02 NOTE — Assessment & Plan Note (Signed)
Did not tolerate crestor.  Low cholesterol diet and exercise.  Follow lipid panel.

## 2019-02-02 NOTE — Assessment & Plan Note (Signed)
Followed by neurology.   

## 2019-02-02 NOTE — Assessment & Plan Note (Signed)
Blood pressure has been under good control.  Remain off lasix.  Kidney function improved last check here.  Follow.

## 2019-02-03 DIAGNOSIS — D509 Iron deficiency anemia, unspecified: Secondary | ICD-10-CM | POA: Diagnosis not present

## 2019-02-06 ENCOUNTER — Encounter: Payer: Self-pay | Admitting: Family Medicine

## 2019-02-06 ENCOUNTER — Telehealth: Payer: Self-pay | Admitting: Internal Medicine

## 2019-02-06 ENCOUNTER — Other Ambulatory Visit: Payer: Self-pay

## 2019-02-06 ENCOUNTER — Ambulatory Visit (INDEPENDENT_AMBULATORY_CARE_PROVIDER_SITE_OTHER): Payer: Medicare Other | Admitting: Family Medicine

## 2019-02-06 VITALS — Ht 63.0 in | Wt 208.0 lb

## 2019-02-06 DIAGNOSIS — L237 Allergic contact dermatitis due to plants, except food: Secondary | ICD-10-CM

## 2019-02-06 DIAGNOSIS — M79671 Pain in right foot: Secondary | ICD-10-CM | POA: Diagnosis not present

## 2019-02-06 MED ORDER — PREDNISONE 10 MG (21) PO TBPK: ORAL_TABLET | ORAL | 0 refills | Status: DC

## 2019-02-06 MED ORDER — FEXOFENADINE HCL 180 MG PO TABS: 180.0000 mg | ORAL_TABLET | Freq: Every day | ORAL | 0 refills | Status: DC

## 2019-02-06 MED ORDER — TRIAMCINOLONE ACETONIDE 0.1 % EX CREA: 1.0000 "application " | TOPICAL_CREAM | Freq: Two times a day (BID) | CUTANEOUS | 0 refills | Status: DC

## 2019-02-06 NOTE — Telephone Encounter (Signed)
Pt called and said she thinks that she has posion oak on her foot and also an knot has come up. Please contact pt

## 2019-02-06 NOTE — Progress Notes (Signed)
Patient ID: Victoria Hanson, female   DOB: [birth year], 68 y.o.   MRN: [id number]    Virtual Visit via phone Note  This visit type was conducted due to national recommendations for restrictions regarding the COVID-19 pandemic (e.g. social distancing).  This format is felt to be most appropriate for this patient at this time.  All issues noted in this document were discussed and addressed.  No physical exam was performed (except for noted visual exam findings with Video Visits).   I connected with [name] today at 10:20 AM EDT by telephone and verified that I am speaking with the correct person using two identifiers. Location patient: home Location provider: work or home office Persons participating in the virtual visit: patient, provider  I discussed the limitations, risks, security and privacy concerns of performing an evaluation and management service by telephone and the availability of in person appointments. I also discussed with the patient that there may be a patient responsible charge related to this service. The patient expressed understanding and agreed to proceed.  HPI:  Patient and I connected via video due to suspected poison oak rash on right foot.  Patient also has some pain/knot on the back of right heel is been happening off and on for many months and thinks it may be inflamed due to having a rash on the right foot as well.  States her dog was outside and got into some weeds and she believes she ended up being exposed from her dog.  States the rash is red, itchy and burning sometimes.  Denies any open or draining areas of skin.  Denies any blisters.  Denies any  discharge from rash area.  ROS: See pertinent positives and negatives per HPI.  Past Medical History:  Diagnosis Date  . Anxiety   . Asthma   . Atypical chest pain    a. 2003 Cath: reportedly nl per pt [name]);  b. 04/2015 [place] Admission, neg troponin.  . Colon cancer ([place]) 1999   a. 1999: Pt had partial  colectomy. Did develop lung metastasis. Had right upper lobectomy in 01/2007 then had associated chemotherapy.  Marland Kitchen COPD (chronic obstructive pulmonary disease) ([place])   . DDD (degenerative disc disease), lumbosacral   . Depression   . Echocardiogram abnormal    a. 02/2010 Echo: EF 55-60%, mild LAE, no regional wall motion abnormalities  . Elevated transaminase level    a. ? NASH;  b. 06/2015 - nl LFTs.  . GERD (gastroesophageal reflux disease)   . Hematuria   . Holter monitor, abnormal    a. 12/2009 Holter monitoring: NSR, rare PACs and PVCs.  Marland Kitchen Hypercholesterolemia   . Hyperlipidemia   . Hypertension   . Leaky heart valve    a. Per pt report - no evidence of valvular abnormalities on prior echoes.  . Lesion of skin of Right breast 10/20/2017  . Lung metastases ([place]) 2008   a. S/P resection (lobectomy - right)  . Morbid obesity ([place])   . Nephrolithiasis   . Parkinson disease ([place]) 08/2016   [name]    Past Surgical History:  Procedure Laterality Date  . BREAST EXCISIONAL BIOPSY Right 10/07/2009   benign  . CARDIAC CATHETERIZATION  2003   negative as per pt report  . CHOLECYSTECTOMY  2000  . COLON SURGERY     Colon cancer, removed part of large colon  . COLONOSCOPY N/A 04/25/2015   Procedure: COLONOSCOPY;  Surgeon: Manya Silvas, MD;  Location[facility] END[facility]COPY;  Service: Endoscopy;  Laterality: N/A;  . COLONOSCOPY WITH PROPOFOL N/A 06/14/2017   Procedure: COLONOSCOPY WITH PROPOFOL;  Surgeon: Manya Silvas, MD;  Location: [facility] ENDOSCOPY;  Service: Endoscopy;  Laterality: N/A;  . LOBECTOMY  01/2007   Right lung, upper lobe right side removed  . TOTAL ABDOMINAL HYSTERECTOMY     abnormal bleeding  . TUBAL LIGATION  1980    Family History  Problem Relation Age of Onset  . Coronary artery disease Mother        died @ 66 of MI.  . Mental illness Mother   . Diabetes Mother   . Heart attack Father 42       MI  . Cancer Father        lung - died in his mid-70's  .  Hypertension Father   . Diabetes Father   . Sudden death Brother   . Mental illness Brother   . Heart attack Brother        MI @ 39, died @ age 35.  . Breast cancer Neg Hx    Social History   Tobacco Use  . Smoking status: Passive Smoke Exposure - Never Smoker  . Smokeless tobacco: Never Used  . Tobacco comment: husband and son smoked in her home.  Substance Use Topics  . Alcohol use: Yes    Alcohol/week: 1.0 standard drinks    Types: 1 Glasses of wine per week    Comment: rare wine cooler.     Current Outpatient Medications:  .  ALPRAZolam (XANAX) 0.5 MG tablet, 1/2 tablet q day prn, Disp: 30 tablet, Rfl: 0 .  aspirin EC 81 MG tablet, Take 81 mg by mouth as needed. , Disp: , Rfl:  .  busPIRone (BUSPAR) 10 MG tablet, Take 1 tablet (10 mg total) by mouth daily., Disp: 30 tablet, Rfl: 1 .  carbidopa-levodopa (SINEMET IR) 25-100 MG tablet, Take 2 tablets by mouth 3 (three) times daily. , Disp: , Rfl:  .  citalopram (CELEXA) 10 MG tablet, Take 1 tablet (10 mg total) by mouth daily., Disp: 90 tablet, Rfl: 1 .  fluticasone (FLONASE) 50 MCG/ACT nasal spray, Place 2 sprays into both nostrils daily., Disp: 16 g, Rfl: 6 .  fluticasone-salmeterol (ADVAIR HFA) 115-21 MCG/ACT inhaler, Inhale 2 puffs into the lungs 2 (two) times daily., Disp: 1 Inhaler, Rfl: 12 .  furosemide (LASIX) 20 MG tablet, , Disp: , Rfl:  .  hydrochlorothiazide (HYDRODIURIL) 12.5 MG tablet, Take 1 tablet (12.5 mg total) by mouth daily., Disp: 90 tablet, Rfl: 1 .  Multiple Vitamin (MULTIVITAMIN) tablet, Take 1 tablet by mouth daily., Disp: , Rfl:  .  omeprazole (PRILOSEC) 20 MG capsule, TAKE 1 CAPSULE BY MOUTH DAILY, Disp: 90 capsule, Rfl: 1 .  potassium chloride SA (K-DUR,KLOR-CON) 20 MEQ tablet, Take 1 tablet (20 mEq total) by mouth 2 (two) times daily., Disp: 180 tablet, Rfl: 1 .  pravastatin (PRAVACHOL) 10 MG tablet, TAKE 1 TABLET BY MOUTH DAILY, Disp: 90 tablet, Rfl: 1 .  traZODone (DESYREL) 50 MG tablet, Take 2  tablets (100 mg total) by mouth at bedtime., Disp: 180 tablet, Rfl: 1 .  VENTOLIN HFA 108 (90 Base) MCG/ACT inhaler, INHALE 2 PUFFS BY MOUTH INTO THE LUNGS EVERY 6 HOURS AS NEEDED FOR WHEEZING, Disp: 18 g, Rfl: 1 .  vitamin B-12 (CYANOCOBALAMIN) 100 MCG tablet, Take 100 mcg by mouth daily., Disp: , Rfl:   EXAM:  GENERAL: alert, oriented, sounds well and in no acute distress  LUNGS: speaking in full sentences.  no signs of respiratory distress, breathing rate appears normal, no obvious gross SOB, gasping or wheezing  PSYCH/NEURO: pleasant and cooperative, no obvious depression or anxiety, speech and thought processing grossly intact  ASSESSMENT AND PLAN:  Discussed the following assessment and plan:    1. Poison oak dermatitis  - predniSONE (STERAPRED UNI-PAK 21 TAB) 10 MG (21) TBPK tablet; Take according to pack instructions  Dispense: 21 tablet; Refill: 0 - triamcinolone cream (KENALOG) 0.1 %; Apply 1 application topically 2 (two) times daily.  Dispense: 30 g; Refill: 0 - fexofenadine (ALLEGRA ALLERGY) 180 MG tablet; Take 1 tablet (180 mg total) by mouth daily.  Dispense: 30 tablet; Refill: 0  Also advised she can use topical calamine lotion to help calm itching.  Encouraged to launder bedding and any other recently worn clothing as well as wiping down commonly touch services to help avoid any spread of poison oak.  2. Right foot pain  - DG Foot Complete Right; Future  Patient will get x-ray of right foot to further investigate pain/lump area she feels in heel.  Advised to hold off on getting x-ray until the poison oak rash has calmed down.    I discussed the assessment and treatment plan with the patient. The patient was provided an opportunity to ask questions and all were answered. The patient agreed with the plan and demonstrated an understanding of the instructions.   The patient was advised to call back or seek an in-person evaluation if the symptoms worsen or if the  condition fails to improve as anticipated.  I provided 15 minutes of non-face-to-face time during this encounter.   Jodelle Green, FNP

## 2019-02-06 NOTE — Telephone Encounter (Signed)
Pt was seen by Ander Purpura and has appt with Dr Nicki Reaper on 9/4

## 2019-02-10 ENCOUNTER — Telehealth: Payer: Self-pay

## 2019-02-10 NOTE — Telephone Encounter (Signed)
Copied from [place] [id number]. Topic: General - Inquiry >> Feb 10, 2019 11:31 AM Mathis Bud wrote: Reason for CRM: Patient has appt 9/4 in office and would like to make that appt virtual if possible Patient call back [phone]

## 2019-02-13 ENCOUNTER — Ambulatory Visit (INDEPENDENT_AMBULATORY_CARE_PROVIDER_SITE_OTHER): Payer: Medicare Other | Admitting: Internal Medicine

## 2019-02-13 ENCOUNTER — Other Ambulatory Visit: Payer: Self-pay

## 2019-02-13 ENCOUNTER — Encounter: Payer: Self-pay | Admitting: Internal Medicine

## 2019-02-13 ENCOUNTER — Emergency Department: Payer: Medicare Other

## 2019-02-13 ENCOUNTER — Emergency Department
Admission: EM | Admit: 2019-02-13 | Discharge: 2019-02-13 | Disposition: A | Discharge status: Home / Self Care | Payer: Medicare Other | Attending: Emergency Medicine | Admitting: Emergency Medicine

## 2019-02-13 DIAGNOSIS — N183 Chronic kidney disease, stage 3 unspecified: Secondary | ICD-10-CM

## 2019-02-13 DIAGNOSIS — Z20828 Contact with and (suspected) exposure to other viral communicable diseases: Secondary | ICD-10-CM | POA: Diagnosis not present

## 2019-02-13 DIAGNOSIS — Z7722 Contact with and (suspected) exposure to environmental tobacco smoke (acute) (chronic): Secondary | ICD-10-CM | POA: Diagnosis not present

## 2019-02-13 DIAGNOSIS — R0602 Shortness of breath: Secondary | ICD-10-CM | POA: Insufficient documentation

## 2019-02-13 DIAGNOSIS — Z7982 Long term (current) use of aspirin: Secondary | ICD-10-CM | POA: Diagnosis not present

## 2019-02-13 DIAGNOSIS — G20 Parkinson's disease: Secondary | ICD-10-CM | POA: Diagnosis not present

## 2019-02-13 DIAGNOSIS — I10 Essential (primary) hypertension: Secondary | ICD-10-CM

## 2019-02-13 DIAGNOSIS — D500 Iron deficiency anemia secondary to blood loss (chronic): Secondary | ICD-10-CM | POA: Diagnosis not present

## 2019-02-13 DIAGNOSIS — Z79899 Other long term (current) drug therapy: Secondary | ICD-10-CM | POA: Diagnosis not present

## 2019-02-13 DIAGNOSIS — R55 Syncope and collapse: Secondary | ICD-10-CM

## 2019-02-13 DIAGNOSIS — D649 Anemia, unspecified: Secondary | ICD-10-CM | POA: Diagnosis not present

## 2019-02-13 DIAGNOSIS — Z8673 Personal history of transient ischemic attack (TIA), and cerebral infarction without residual deficits: Secondary | ICD-10-CM | POA: Diagnosis not present

## 2019-02-13 DIAGNOSIS — I129 Hypertensive chronic kidney disease with stage 1 through stage 4 chronic kidney disease, or unspecified chronic kidney disease: Secondary | ICD-10-CM | POA: Diagnosis not present

## 2019-02-13 LAB — TROPONIN I (HIGH SENSITIVITY): Troponin I (High Sensitivity): 5 ng/L (ref <18)

## 2019-02-13 LAB — BASIC METABOLIC PANEL
Anion gap: 11 (ref 5–15)
BUN: 17 mg/dL (ref 8–23)
CO2: 26 mmol/L (ref 22–32)
Calcium: 8.9 mg/dL (ref 8.9–10.3)
Chloride: 98 mmol/L (ref 98–111)
Creatinine, Ser: 1.29 mg/dL — ABNORMAL HIGH (ref 0.44–1.00)
GFR calc Af Amer: 49 mL/min — ABNORMAL LOW (ref >60)
GFR calc non Af Amer: 42 mL/min — ABNORMAL LOW (ref >60)
Glucose, Bld: 100 mg/dL — ABNORMAL HIGH (ref 70–99)
Potassium: 3.8 mmol/L (ref 3.5–5.1)
Sodium: 135 mmol/L (ref 135–145)

## 2019-02-13 LAB — CBC
HCT: 35.4 % — ABNORMAL LOW (ref 36.0–46.0)
Hemoglobin: 11.4 g/dL — ABNORMAL LOW (ref 12.0–15.0)
MCH: 28.5 pg (ref 26.0–34.0)
MCHC: 32.2 g/dL (ref 30.0–36.0)
MCV: 88.5 fL (ref 80.0–100.0)
Platelets: 281 10*3/uL (ref 150–400)
RBC: 4 MIL/uL (ref 3.87–5.11)
RDW: 12.9 % (ref 11.5–15.5)
WBC: 13.3 10*3/uL — ABNORMAL HIGH (ref 4.0–10.5)
nRBC: 0 % (ref 0.0–0.2)

## 2019-02-13 LAB — MAGNESIUM: Magnesium: 2.1 mg/dL (ref 1.7–2.4)

## 2019-02-13 LAB — GLUCOSE, CAPILLARY: Glucose-Capillary: 92 mg/dL (ref 70–99)

## 2019-02-13 LAB — BRAIN NATRIURETIC PEPTIDE: B Natriuretic Peptide: 73 pg/mL (ref 0.0–100.0)

## 2019-02-13 NOTE — ED Notes (Signed)
Pt oxygen saturation 100% while pt ambulating in room. Pt with steady gait. Pt assisted back to bed. Pt oxygen saturation maintained at 100% when pt returned back to bed.

## 2019-02-13 NOTE — ED Provider Notes (Signed)
[facility] Emergency Department Provider Note  ____________________________________________   First MD Initiated Contact with Patient 02/13/19 2037     (approximate)  I have reviewed the triage vital signs and the nursing notes.   HISTORY  Chief Complaint Shortness of Breath    HPI Victoria Hanson is a 68 y.o. female with history of asthma, atypical chest pain, COPD, hypertension, hyperlipidemia, here with intermittent shortness of breath.  The patient states that for the last several weeks, she has had progressively worsening episodes in which with short exertion, she begins to feel very weak and short of breath.  She just underwent extensive cardiac work-up for this with the last 1 to 2 months instead that everything was normal.  She states that with even slight exertion she begins to feel extremely dyspneic.  She states that when she rests this improves, but also improves with Xanax.  No alleviating factors.  No fevers or chills.  No recent illnesses.  She did recently take some prednisone for poison ivy, and states that she felt like her symptoms did actually improve while she was on this.  No known increase in wheezing.  Denies any palpitations.        Past Medical History:  Diagnosis Date  . Anxiety   . Asthma   . Atypical chest pain    a. 2003 Cath: reportedly nl per pt [name]);  b. 04/2015 [place] Admission, neg troponin.  . Colon cancer ([place]) 1999   a. 1999: Pt had partial colectomy. Did develop lung metastasis. Had right upper lobectomy in 01/2007 then had associated chemotherapy.  Marland Kitchen COPD (chronic obstructive pulmonary disease) ([place])   . DDD (degenerative disc disease), lumbosacral   . Depression   . Echocardiogram abnormal    a. 02/2010 Echo: EF 55-60%, mild LAE, no regional wall motion abnormalities  . Elevated transaminase level    a. ? NASH;  b. 06/2015 - nl LFTs.  . GERD (gastroesophageal reflux disease)   . Hematuria   . Holter  monitor, abnormal    a. 12/2009 Holter monitoring: NSR, rare PACs and PVCs.  Marland Kitchen Hypercholesterolemia   . Hyperlipidemia   . Hypertension   . Leaky heart valve    a. Per pt report - no evidence of valvular abnormalities on prior echoes.  . Lesion of skin of Right breast 10/20/2017  . Lung metastases ([place]) 2008   a. S/P resection (lobectomy - right)  . Morbid obesity ([place])   . Nephrolithiasis   . Parkinson disease ([place]) 08/2016   [name]    Patient Active Problem List   Diagnosis Date Noted  . Near syncope 02/13/2019  . Fibromuscular dysplasia ([place]) 02/02/2019  . Abdominal bloating 01/04/2019  . Unsteady gait 12/03/2018  . History of UTI 10/08/2018  . Ear fullness, right 10/08/2018  . B12 deficiency 03/11/2018  . Discharge from right nipple 11/10/2017  . Goals of care, counseling/discussion 10/15/2017  . Parkinson's disease ([place]) 07/31/2017  . Chronic obstructive pulmonary disease ([place]) 07/31/2017  . REM behavioral disorder 05/28/2017  . Elevated liver function tests 08/25/2016  . Carotid stenosis 07/19/2016  . Tremor 06/01/2016  . Osteoporosis 04/08/2016  . Foot pain, bilateral 07/24/2015  . Head pain 07/24/2015  . Hyperlipidemia 06/21/2015  . Other fatigue 06/21/2015  . BMI 33.0-33.9,adult 06/21/2015  . Abnormal mammogram 06/19/2015  . Urinary system disease 03/28/2015  . Hyperglycemia 03/20/2015  . Swelling of lower extremity 11/20/2014  . Dysuria 10/24/2014  . Health care maintenance 07/17/2014  .  Hypokalemia 04/05/2013  . TIA (transient ischemic attack) 02/09/2013  . Temporary cerebral vascular dysfunction 02/09/2013  . Anxiety, mild 03/13/2012  . Depression, recurrent ([place]) 03/13/2012  . History of colon cancer 03/13/2012  . Hypertension 03/13/2012  . CKD (chronic kidney disease), stage III ([place]) 03/13/2012  . GERD (gastroesophageal reflux disease) 03/13/2012  . Acid reflux 03/13/2012  . History of malignant neoplasm metastatic to lung 03/13/2012  . Major  depressive disorder with single episode 03/13/2012  . Hypercholesterolemia 02/02/2010  . SOB (shortness of breath) 02/02/2010  . Pure hypercholesterolemia 02/02/2010    Past Surgical History:  Procedure Laterality Date  . BREAST EXCISIONAL BIOPSY Right 10/07/2009   benign  . CARDIAC CATHETERIZATION  2003   negative as per pt report  . CHOLECYSTECTOMY  2000  . COLON SURGERY     Colon cancer, removed part of large colon  . COLONOSCOPY N/A 04/25/2015   Procedure: COLONOSCOPY;  Surgeon: Manya Silvas, MD;  Location: [facility] ENDOSCOPY;  Service: Endoscopy;  Laterality: N/A;  . COLONOSCOPY WITH PROPOFOL N/A 06/14/2017   Procedure: COLONOSCOPY WITH PROPOFOL;  Surgeon: Manya Silvas, MD;  Location[facility] ENDOSCOPY;  Service: Endoscopy;  Laterality: N/A;  . LOBECTOMY  01/2007   Right lung, upper lobe right side removed  . TOTAL ABDOMINAL HYSTERECTOMY     abnormal bleeding  . TUBAL LIGATION  1980    Prior to Admission medications   Medication Sig Start Date End Date Taking? Authorizing Provider  ALPRAZolam Duanne Moron) 0.5 MG tablet 1/2 tablet q day prn 01/01/19   Einar Pheasant, MD  aspirin EC 81 MG tablet Take 81 mg by mouth as needed.     [provider]  busPIRone (BUSPAR) 10 MG tablet Take 1 tablet (10 mg total) by mouth daily. 11/07/18   Einar Pheasant, MD  carbidopa-levodopa (SINEMET IR) 25-100 MG tablet Take 2 tablets by mouth 3 (three) times daily.  07/30/17   [provider]  citalopram (CELEXA) 10 MG tablet Take 1 tablet (10 mg total) by mouth daily. 07/29/18   Einar Pheasant, MD  fexofenadine [facility] ALLERGY) 180 MG tablet Take 1 tablet (180 mg total) by mouth daily. 02/06/19   Jodelle Green, FNP  fluticasone (FLONASE) 50 MCG/ACT nasal spray Place 2 sprays into both nostrils daily. 07/03/18   Jodelle Green, FNP  fluticasone-salmeterol (ADVAIR HFA) 784-69 MCG/ACT inhaler Inhale 2 puffs into the lungs 2 (two) times daily. 08/28/18   Wilhelmina Mcardle, MD  furosemide  (LASIX) 20 MG tablet  12/22/18   [provider]  hydrochlorothiazide (HYDRODIURIL) 12.5 MG tablet Take 1 tablet (12.5 mg total) by mouth daily. 07/29/18   Einar Pheasant, MD  Multiple Vitamin (MULTIVITAMIN) tablet Take 1 tablet by mouth daily.    [provider]  omeprazole (PRILOSEC) 20 MG capsule TAKE 1 CAPSULE BY MOUTH DAILY 12/24/18   Einar Pheasant, MD  potassium chloride SA (K-DUR,KLOR-CON) 20 MEQ tablet Take 1 tablet (20 mEq total) by mouth 2 (two) times daily. 07/29/18   Einar Pheasant, MD  pravastatin (PRAVACHOL) 10 MG tablet TAKE 1 TABLET BY MOUTH DAILY 12/22/18   Einar Pheasant, MD  predniSONE (STERAPRED UNI-PAK 21 TAB) 10 MG (21) TBPK tablet Take according to pack instructions 02/06/19   Jodelle Green, FNP  traZODone (DESYREL) 50 MG tablet Take 2 tablets (100 mg total) by mouth at bedtime. 07/29/18   Einar Pheasant, MD  triamcinolone cream (KENALOG) 0.1 % Apply 1 application topically 2 (two) times daily. 02/06/19   Philis Nettle  M, FNP  VENTOLIN HFA 108 (90 Base) MCG/ACT inhaler INHALE 2 PUFFS BY MOUTH INTO THE LUNGS EVERY 6 HOURS AS NEEDED FOR WHEEZING 02/02/19   Einar Pheasant, MD  vitamin B-12 (CYANOCOBALAMIN) 100 MCG tablet Take 100 mcg by mouth daily.    [provider]    Allergies Macrobid [nitrofurantoin macrocrystal]; Antihistamines, diphenhydramine-type; Aspirin; Ceftin [cefuroxime axetil]; Celecoxib; Cymbalta [duloxetine hcl]; Diphenhydramine; Escitalopram; Lexapro [escitalopram oxalate]; Metronidazole; Nitrofurantoin; Zoloft [sertraline hcl]; and Ciprofloxacin  Family History  Problem Relation Age of Onset  . Coronary artery disease Mother        died @ 85 of MI.  . Mental illness Mother   . Diabetes Mother   . Heart attack Father 57       MI  . Cancer Father        lung - died in his mid-70's  . Hypertension Father   . Diabetes Father   . Sudden death Brother   . Mental illness Brother   . Heart attack Brother        MI @ 28, died @  age 69.  . Breast cancer Neg Hx     Social History Social History   Tobacco Use  . Smoking status: Passive Smoke Exposure - Never Smoker  . Smokeless tobacco: Never Used  . Tobacco comment: husband and son smoked in her home.  Substance Use Topics  . Alcohol use: Yes    Alcohol/week: 1.0 standard drinks    Types: 1 Glasses of wine per week    Comment: rare wine cooler.  . Drug use: No    Review of Systems  Review of Systems  Constitutional: Positive for fatigue. Negative for fever.  HENT: Negative for congestion and sore throat.   Eyes: Negative for visual disturbance.  Respiratory: Positive for shortness of breath. Negative for cough.   Cardiovascular: Negative for chest pain.  Gastrointestinal: Negative for abdominal pain, diarrhea, nausea and vomiting.  Genitourinary: Negative for flank pain.  Musculoskeletal: Negative for back pain and neck pain.  Skin: Negative for rash and wound.  Neurological: Positive for weakness.  All other systems reviewed and are negative.    ____________________________________________  PHYSICAL EXAM:      VITAL SIGNS: ED Triage Vitals  Enc Vitals Group     BP 02/13/19 1504 127/62     Pulse Rate 02/13/19 1504 77     Resp 02/13/19 1504 18     Temp 02/13/19 1504 98 F (36.7 C)     Temp Source 02/13/19 1504 Oral     SpO2 02/13/19 1504 98 %     Weight 02/13/19 1505 208 lb (94.3 kg)     Height 02/13/19 1505 5\' 3"  (1.6 m)     Head Circumference --      Peak Flow --      Pain Score 02/13/19 1505 0     Pain Loc --      Pain Edu? --      Excl. in [place]? --      Physical Exam Vitals signs and nursing note reviewed.  Constitutional:      General: She is not in acute distress.    Appearance: She is well-developed.  HENT:     Head: Normocephalic and atraumatic.  Eyes:     Conjunctiva/sclera: Conjunctivae normal.  Neck:     Musculoskeletal: Neck supple.  Cardiovascular:     Rate and Rhythm: Normal rate and regular rhythm.     Heart  sounds: Normal heart sounds. No  murmur. No friction rub.  Pulmonary:     Effort: Pulmonary effort is normal. No respiratory distress.     Breath sounds: Wheezing present. No rales.  Abdominal:     General: There is no distension.     Palpations: Abdomen is soft.     Tenderness: There is no abdominal tenderness.  Musculoskeletal:     Right lower leg: No edema.     Left lower leg: No edema.  Skin:    General: Skin is warm.     Capillary Refill: Capillary refill takes less than 2 seconds.  Neurological:     Mental Status: She is alert and oriented to person, place, and time.     Motor: No abnormal muscle tone.       ____________________________________________   LABS (all labs ordered are listed, but only abnormal results are displayed)  Labs Reviewed  BASIC METABOLIC PANEL - Abnormal; Notable for the following components:      Result Value   Glucose, Bld 100 (*)    Creatinine, Ser 1.29 (*)    GFR calc non Af Amer 42 (*)    GFR calc Af Amer 49 (*)    All other components within normal limits  CBC - Abnormal; Notable for the following components:   WBC 13.3 (*)    Hemoglobin 11.4 (*)    HCT 35.4 (*)    All other components within normal limits  SARS CORONAVIRUS 2 (TAT 6-24 HRS)  GLUCOSE, CAPILLARY  BRAIN NATRIURETIC PEPTIDE  MAGNESIUM  TROPONIN I (HIGH SENSITIVITY)  TROPONIN I (HIGH SENSITIVITY)    ____________________________________________  EKG: Sinus rhythm, ventricular rate 74.  PR 132, QRS 80, QTc 424.No acute ischemic changes noted.  T wave inversions noted in inferolateral leads. ________________________________________  RADIOLOGY All imaging, including plain films, CT scans, and ultrasounds, independently reviewed by me, and interpretations confirmed via formal radiology reads.  ED MD interpretation:   CXR: Negative  Official radiology report(s): Dg Chest 2 View  Result Date: 02/13/2019 CLINICAL DATA:  Shortness of breath EXAM: CHEST - 2 VIEW  COMPARISON:  04/04/2018 FINDINGS: No focal consolidation or pleural effusion. Stable cardiomediastinal silhouette with borderline cardiomegaly. Clips at the right hilus. Mild pleural scarring at the CP angles. No pneumothorax. IMPRESSION: No active cardiopulmonary disease. Stable pleural scarring at the CP angles and right lower lateral lung. Electronically Signed   By: Donavan Foil M.D.   On: 02/13/2019 15:45    ____________________________________________  PROCEDURES   Procedure(s) performed (including Critical Care):  Procedures  ____________________________________________  INITIAL IMPRESSION / MDM / [place] / ED COURSE  As part of my medical decision making, I reviewed the following data within the electronic MEDICAL RECORD NUMBER Notes from prior ED visits and [place] Controlled Substance Database      *LORETTO BELINSKY was evaluated in Emergency Department on 02/14/2019 for the symptoms described in the history of present illness. She was evaluated in the context of the global COVID-19 pandemic, which necessitated consideration that the patient might be at risk for infection with the SARS-CoV-2 virus that causes COVID-19. Institutional protocols and algorithms that pertain to the evaluation of patients at risk for COVID-19 are in a state of rapid change based on information released by regulatory bodies including the CDC and federal and state organizations. These policies and algorithms were followed during the patient's care in the ED.  Some ED evaluations and interventions may be delayed as a result of limited staffing during the pandemic.*   Clinical  Course as of Feb 13 1014  Fri Feb 13, 2019  2043 MCHC: 32.2 [CI]    Clinical Course User Index [CI] Duffy Bruce, MD    Medical Decision Making: 68 yo F with PMHx HTN, HLD, COPD, anxiety, here with SOB w/ exertion. This is a chronic, ongoing issue. She has been seen by Cardiology, Pulmonology, and GI for this. Presents today  2/2 subjective worsening of sx, though now resolved in the ED. On exam, she is very well appearing and in NAD. Satting well on RA. EKG non-ischemic, trop neg despite sx >24 hours - doubt ACS. No arrhythma on tele. Lungs CTAB and sats >98% witih ambulation - doubt COPD, PNA. Could be an underlying component of CHF though appears compensated at this time. Sx do interestingly improve w/ xanax, so could be anxiety component as well. On further interview, pt also tells me she has stopped a dose of her sinemet as well because she felt like it was making her "dreams weird." While etiology of her subjective difficulty ambulating is unclear, this could certainly be contributing, with potential contribution of anxiety once she has difficulty moving.   Had a long discussion with pt re: ressuring labs, imaging, and EKG. She feels comfortable now and would like to continue outpt work-up. Will advise her to start iron supplement for mild chronic anemia, in addition to resuming her ful dose of Parkinson's meds. F/u with her PCP and pulmonologist.  ____________________________________________  FINAL CLINICAL IMPRESSION(S) / ED DIAGNOSES  Final diagnoses:  Shortness of breath  Chronic anemia     MEDICATIONS GIVEN DURING THIS VISIT:  Medications - No data to display   ED Discharge Orders    None       Note:  This document was prepared using Dragon voice recognition software and may include unintentional dictation errors.   Duffy Bruce, MD 02/14/19 [phone]

## 2019-02-13 NOTE — Assessment & Plan Note (Signed)
Per her checks have been under good control.  Follow pressures.  Follow metabolic panel.

## 2019-02-13 NOTE — Progress Notes (Signed)
Patient ID: Victoria Hanson, female   DOB: 02/22/1951, 68 y.o.   MRN: [id number]   Virtual Visit via telephone Note  This visit type was conducted due to national recommendations for restrictions regarding the COVID-19 pandemic (e.g. social distancing).  This format is felt to be most appropriate for this patient at this time.  All issues noted in this document were discussed and addressed.  No physical exam was performed (except for noted visual exam findings with Video Visits).   I connected with [name] by a video enabled telemedicine application and verified that I am speaking with the correct person using two identifiers. Location patient: home Location provider: work Persons participating in the telephone visit: patient, provider  I discussed the limitations, risks, security and privacy concerns of performing an evaluation and management service by telephone and the availability of in person appointments.  The patient expressed understanding and agreed to proceed.   Reason for visit: follow up appt   HPI: Pt reports that starting yesterday, she felt weak and felt like she was going to pass out.  Also has noticed increased sob for the last 24-48 hours.  Persistent.  No increased cough.  No chest pain.  Felt hot.  Unable to check temperature.  Eating.  Having diarrhea.  Tried lying down and drinking orange juice to see if symptoms would improve.  Still with sob.  Still feels weak.  Recently evaluated by oncology.  CT ok.  Released for f/u for her history of colon cancer.  Recently treated for poison oak.  Was on prednisone.  Completed.  States felt flushed on prednisone but otherwise tolerated.  Concerned because of persistent weakness and sob.  Adjusting her medication on her own.      ROS: See pertinent positives and negatives per HPI.  Past Medical History:  Diagnosis Date  . Anxiety   . Asthma   . Atypical chest pain    a. 2003 Cath: reportedly nl per pt [name]);  b.  04/2015 [facility] Admission, neg troponin.  . Colon cancer ([place]) 1999   a. 1999: Pt had partial colectomy. Did develop lung metastasis. Had right upper lobectomy in 01/2007 then had associated chemotherapy.  Marland Kitchen COPD (chronic obstructive pulmonary disease) ([place])   . DDD (degenerative disc disease), lumbosacral   . Depression   . Echocardiogram abnormal    a. 02/2010 Echo: EF 55-60%, mild LAE, no regional wall motion abnormalities  . Elevated transaminase level    a. ? NASH;  b. 06/2015 - nl LFTs.  . GERD (gastroesophageal reflux disease)   . Hematuria   . Holter monitor, abnormal    a. 12/2009 Holter monitoring: NSR, rare PACs and PVCs.  Marland Kitchen Hypercholesterolemia   . Hyperlipidemia   . Hypertension   . Leaky heart valve    a. Per pt report - no evidence of valvular abnormalities on prior echoes.  . Lesion of skin of Right breast 10/20/2017  . Lung metastases ([place]) 2008   a. S/P resection (lobectomy - right)  . Morbid obesity ([place])   . Nephrolithiasis   . Parkinson disease ([place]) 08/2016   [name]    Past Surgical History:  Procedure Laterality Date  . BREAST EXCISIONAL BIOPSY Right 10/07/2009   benign  . CARDIAC CATHETERIZATION  2003   negative as per pt report  . CHOLECYSTECTOMY  2000  . COLON SURGERY     Colon cancer, removed part of large colon  . COLONOSCOPY N/A 04/25/2015   Procedure: COLONOSCOPY;  Surgeon:  Manya Silvas, MD;  Location[facility] ENDOSCOPY;  Service: Endoscopy;  Laterality: N/A;  . COLONOSCOPY WITH PROPOFOL N/A 06/14/2017   Procedure: COLONOSCOPY WITH PROPOFOL;  Surgeon: Manya Silvas, MD;  Location: [facility] ENDOSCOPY;  Service: Endoscopy;  Laterality: N/A;  . LOBECTOMY  01/2007   Right lung, upper lobe right side removed  . TOTAL ABDOMINAL HYSTERECTOMY     abnormal bleeding  . TUBAL LIGATION  1980    Family History  Problem Relation Age of Onset  . Coronary artery disease Mother        died @ 75 of MI.  . Mental illness Mother   . Diabetes Mother   . Heart  attack Father 1       MI  . Cancer Father        lung - died in his mid-70's  . Hypertension Father   . Diabetes Father   . Sudden death Brother   . Mental illness Brother   . Heart attack Brother        MI @ 51, died @ age 75.  . Breast cancer Neg Hx     SOCIAL HX: reviewed.    Current Outpatient Medications:  .  ALPRAZolam (XANAX) 0.5 MG tablet, 1/2 tablet q day prn, Disp: 30 tablet, Rfl: 0 .  aspirin EC 81 MG tablet, Take 81 mg by mouth as needed. , Disp: , Rfl:  .  busPIRone (BUSPAR) 10 MG tablet, Take 1 tablet (10 mg total) by mouth daily., Disp: 30 tablet, Rfl: 1 .  carbidopa-levodopa (SINEMET IR) 25-100 MG tablet, Take 2 tablets by mouth 3 (three) times daily. , Disp: , Rfl:  .  citalopram (CELEXA) 10 MG tablet, Take 1 tablet (10 mg total) by mouth daily., Disp: 90 tablet, Rfl: 1 .  fexofenadine (ALLEGRA ALLERGY) 180 MG tablet, Take 1 tablet (180 mg total) by mouth daily., Disp: 30 tablet, Rfl: 0 .  fluticasone (FLONASE) 50 MCG/ACT nasal spray, Place 2 sprays into both nostrils daily., Disp: 16 g, Rfl: 6 .  fluticasone-salmeterol (ADVAIR HFA) 115-21 MCG/ACT inhaler, Inhale 2 puffs into the lungs 2 (two) times daily., Disp: 1 Inhaler, Rfl: 12 .  furosemide (LASIX) 20 MG tablet, , Disp: , Rfl:  .  hydrochlorothiazide (HYDRODIURIL) 12.5 MG tablet, Take 1 tablet (12.5 mg total) by mouth daily., Disp: 90 tablet, Rfl: 1 .  Multiple Vitamin (MULTIVITAMIN) tablet, Take 1 tablet by mouth daily., Disp: , Rfl:  .  omeprazole (PRILOSEC) 20 MG capsule, TAKE 1 CAPSULE BY MOUTH DAILY, Disp: 90 capsule, Rfl: 1 .  potassium chloride SA (K-DUR,KLOR-CON) 20 MEQ tablet, Take 1 tablet (20 mEq total) by mouth 2 (two) times daily., Disp: 180 tablet, Rfl: 1 .  pravastatin (PRAVACHOL) 10 MG tablet, TAKE 1 TABLET BY MOUTH DAILY, Disp: 90 tablet, Rfl: 1 .  predniSONE (STERAPRED UNI-PAK 21 TAB) 10 MG (21) TBPK tablet, Take according to pack instructions, Disp: 21 tablet, Rfl: 0 .  traZODone (DESYREL) 50  MG tablet, Take 2 tablets (100 mg total) by mouth at bedtime., Disp: 180 tablet, Rfl: 1 .  triamcinolone cream (KENALOG) 0.1 %, Apply 1 application topically 2 (two) times daily., Disp: 30 g, Rfl: 0 .  VENTOLIN HFA 108 (90 Base) MCG/ACT inhaler, INHALE 2 PUFFS BY MOUTH INTO THE LUNGS EVERY 6 HOURS AS NEEDED FOR WHEEZING, Disp: 18 g, Rfl: 1 .  vitamin B-12 (CYANOCOBALAMIN) 100 MCG tablet, Take 100 mcg by mouth daily., Disp: , Rfl:   EXAM:  VITALS per patient  if applicable: blood pressure averaging per pt 120/80s  GENERAL: sound to be sob talking on phone.  Answering questions appropriately.   PSYCH/NEURO: pleasant and cooperative, no obvious depression or anxiety, speech and thought processing grossly intact  ASSESSMENT AND PLAN:  Discussed the following assessment and plan:  CKD (chronic kidney disease), stage III Off lasix.  Is taking hctz.  Renal function had improved on last check here.  Stay hydrated.  Remain off antiinflammatories.  Follow metabolic panel.    Hypertension Per her checks have been under good control.  Follow pressures.  Follow metabolic panel.   SOB (shortness of breath) Acute sob yesterday.  Also felt like she was going to pass out.  Previously evaluated by cardiology.  Also has seen pulmonary.  Increased sob yesterday.  States had to use her inhalers "often yesterday".  No cough.  Feels hot.  Denies known covid exposure.  Given persistent weakness and sob, discussed the need for further evaluation.  Agrees to ER evaluation.  ER notified.    Near syncope Describes feeling like she is going to pass out and weakness.  Has tried lying down and drinking orange juice.  Discussed importance of staying hydrated.  With diarrhea, sob and weakness, I do feel she warrants further evaluation.  Denies known covid exposure.  Pt agreeable to ER evaluation.  ER notified.      I discussed the assessment and treatment plan with the patient. The patient was provided an opportunity to  ask questions and all were answered. The patient agreed with the plan and demonstrated an understanding of the instructions.   The patient was advised to call back or seek an in-person evaluation if the symptoms worsen or if the condition fails to improve as anticipated.  I provided 18 minutes of non-face-to-face time during this encounter.   Einar Pheasant, MD

## 2019-02-13 NOTE — Assessment & Plan Note (Signed)
Off lasix.  Is taking hctz.  Renal function had improved on last check here.  Stay hydrated.  Remain off antiinflammatories.  Follow metabolic panel.

## 2019-02-13 NOTE — Assessment & Plan Note (Signed)
Describes feeling like she is going to pass out and weakness.  Has tried lying down and drinking orange juice.  Discussed importance of staying hydrated.  With diarrhea, sob and weakness, I do feel she warrants further evaluation.  Denies known covid exposure.  Pt agreeable to ER evaluation.  ER notified.

## 2019-02-13 NOTE — Discharge Instructions (Addendum)
For your anemia, start taking the over-the-counter iron supplement.  If it is ferrous sulfate, 325 mg tablets once a day are recommended.  Most over-the-counter regimens can be taken 1 tablet, once daily, with food.  Take these in the morning.  For your weakness, I do agree with temporarily holding your pravastatin.  However, for now, I would recommend going back to taking the prescribed Parkinson's medication.  Not taking these could contribute to worsening lightheadedness with standing and difficulty moving.  Drink at least 8 glasses of water per day for the next 2 to 3 days.  Make sure to use your inhalers as prescribed.  Continue your Xanax as needed.

## 2019-02-13 NOTE — Assessment & Plan Note (Signed)
Acute sob yesterday.  Also felt like she was going to pass out.  Previously evaluated by cardiology.  Also has seen pulmonary.  Increased sob yesterday.  States had to use her inhalers "often yesterday".  No cough.  Feels hot.  Denies known covid exposure.  Given persistent weakness and sob, discussed the need for further evaluation.  Agrees to ER evaluation.  ER notified.

## 2019-02-13 NOTE — ED Triage Notes (Signed)
Reports that she has hx of SOB at baseline but over last few days increased SOB. Called PCP and told to come to ER. Reports that she had episodes yesterday and today both where she began to hyperventilate. States she became lightheaded and drank orange juice, which made her feel better. Pt does not appear in any SOB currently. Pt alert and oriented X4, cooperative, RR even and unlabored, color WNL. Pt in NAD.

## 2019-02-14 LAB — SARS CORONAVIRUS 2 (TAT 6-24 HRS): SARS Coronavirus 2: NEGATIVE

## 2019-02-26 ENCOUNTER — Ambulatory Visit (INDEPENDENT_AMBULATORY_CARE_PROVIDER_SITE_OTHER): Payer: Medicare Other

## 2019-02-26 ENCOUNTER — Ambulatory Visit (INDEPENDENT_AMBULATORY_CARE_PROVIDER_SITE_OTHER): Payer: Medicare Other | Admitting: Family Medicine

## 2019-02-26 ENCOUNTER — Encounter: Payer: Self-pay | Admitting: Family Medicine

## 2019-02-26 ENCOUNTER — Other Ambulatory Visit: Payer: Self-pay

## 2019-02-26 VITALS — BP 128/72 | HR 81 | Temp 97.0°F | Resp 20 | Ht 63.0 in | Wt 207.0 lb

## 2019-02-26 DIAGNOSIS — L84 Corns and callosities: Secondary | ICD-10-CM

## 2019-02-26 DIAGNOSIS — R319 Hematuria, unspecified: Secondary | ICD-10-CM | POA: Diagnosis not present

## 2019-02-26 DIAGNOSIS — M7660 Achilles tendinitis, unspecified leg: Secondary | ICD-10-CM

## 2019-02-26 DIAGNOSIS — Z23 Encounter for immunization: Secondary | ICD-10-CM | POA: Diagnosis not present

## 2019-02-26 DIAGNOSIS — N390 Urinary tract infection, site not specified: Secondary | ICD-10-CM

## 2019-02-26 DIAGNOSIS — M79671 Pain in right foot: Secondary | ICD-10-CM | POA: Diagnosis not present

## 2019-02-26 DIAGNOSIS — M7989 Other specified soft tissue disorders: Secondary | ICD-10-CM | POA: Diagnosis not present

## 2019-02-26 LAB — POCT URINALYSIS DIPSTICK
Bilirubin, UA: NEGATIVE
Blood, UA: 10
Glucose, UA: NEGATIVE
Ketones, UA: NEGATIVE
Nitrite, UA: POSITIVE
Protein, UA: POSITIVE — AB
Spec Grav, UA: 1.015 (ref 1.010–1.025)
Urobilinogen, UA: 0.2 E.U./dL
pH, UA: 6 (ref 5.0–8.0)

## 2019-02-26 MED ORDER — SULFAMETHOXAZOLE-TRIMETHOPRIM 800-160 MG PO TABS: 1.0000 | ORAL_TABLET | Freq: Two times a day (BID) | ORAL | 0 refills | Status: DC

## 2019-02-26 NOTE — Progress Notes (Signed)
Subjective:    Patient ID: Victoria Hanson, female    DOB: 1950-10-04, 68 y.o.   MRN: [id number]  HPI   Patient presents to clinic complaining of UTI symptoms for 2 to 3 days.  Complains of increased frequency, burning and pressure with urination and also some cloudiness to her urine.  Denies nausea, vomiting or diarrhea.  Denies fever or chills.  Also has a corn on right pinky toe, but corn pad on area which has helped calm down the soreness a little bit.  And complains of sore area on back of right foot near heel/Achilles tendon.  Patient Active Problem List   Diagnosis Date Noted  . Near syncope 02/13/2019  . Fibromuscular dysplasia ([place]) 02/02/2019  . Abdominal bloating 01/04/2019  . Unsteady gait 12/03/2018  . History of UTI 10/08/2018  . Ear fullness, right 10/08/2018  . B12 deficiency 03/11/2018  . Discharge from right nipple 11/10/2017  . Goals of care, counseling/discussion 10/15/2017  . Parkinson's disease ([place]) 07/31/2017  . Chronic obstructive pulmonary disease ([place]) 07/31/2017  . REM behavioral disorder 05/28/2017  . Elevated liver function tests 08/25/2016  . Carotid stenosis 07/19/2016  . Tremor 06/01/2016  . Osteoporosis 04/08/2016  . Foot pain, bilateral 07/24/2015  . Head pain 07/24/2015  . Hyperlipidemia 06/21/2015  . Other fatigue 06/21/2015  . BMI 33.0-33.9,adult 06/21/2015  . Abnormal mammogram 06/19/2015  . Urinary system disease 03/28/2015  . Hyperglycemia 03/20/2015  . Swelling of lower extremity 11/20/2014  . Dysuria 10/24/2014  . Health care maintenance 07/17/2014  . Hypokalemia 04/05/2013  . TIA (transient ischemic attack) 02/09/2013  . Temporary cerebral vascular dysfunction 02/09/2013  . Anxiety, mild 03/13/2012  . Depression, recurrent ([place]) 03/13/2012  . History of colon cancer 03/13/2012  . Hypertension 03/13/2012  . CKD (chronic kidney disease), stage III ([place]) 03/13/2012  . GERD (gastroesophageal reflux disease) 03/13/2012  . Acid  reflux 03/13/2012  . History of malignant neoplasm metastatic to lung 03/13/2012  . Major depressive disorder with single episode 03/13/2012  . Hypercholesterolemia 02/02/2010  . SOB (shortness of breath) 02/02/2010  . Pure hypercholesterolemia 02/02/2010   Social History   Tobacco Use  . Smoking status: Passive Smoke Exposure - Never Smoker  . Smokeless tobacco: Never Used  . Tobacco comment: husband and son smoked in her home.  Substance Use Topics  . Alcohol use: Yes    Alcohol/week: 1.0 standard drinks    Types: 1 Glasses of wine per week    Comment: rare wine cooler.   Review of Systems  Constitutional: Negative for chills, fatigue and fever.  HENT: Negative for congestion, ear pain, sinus pain and sore throat.   Eyes: Negative.   Respiratory: Negative for cough, shortness of breath and wheezing.   Cardiovascular: Negative for chest pain, palpitations and leg swelling.  Gastrointestinal: Negative for abdominal pain, diarrhea, nausea and vomiting.  Genitourinary: +dysuria, frequency and urgency.  Musculoskeletal: +pain back of right foot, corn right foot Skin: Negative for color change, pallor and rash.  Neurological: Negative for syncope, light-headedness and headaches.  Psychiatric/Behavioral: The patient is not nervous/anxious.       Objective:   Physical Exam Vitals signs and nursing note reviewed.  Constitutional:      General: She is not in acute distress.    Appearance: She is well-developed. She is not toxic-appearing.  HENT:     Head: Normocephalic and atraumatic.  Eyes:     General: No scleral icterus.    Extraocular Movements: Extraocular movements intact.  Conjunctiva/sclera: Conjunctivae normal.  Neck:     Musculoskeletal: Neck supple.     Trachea: No tracheal deviation.  Cardiovascular:     Rate and Rhythm: Normal rate and regular rhythm.     Heart sounds: Normal heart sounds.  Pulmonary:     Effort: Pulmonary effort is normal. No respiratory  distress.     Breath sounds: Normal breath sounds.  Abdominal:     General: Bowel sounds are normal. There is no distension.     Palpations: Abdomen is soft. There is no mass.     Tenderness: There is abdominal tenderness (mild suprapubic tenderness). There is no right CVA tenderness, left CVA tenderness, guarding or rebound.  Musculoskeletal:       Feet:  Feet:     Comments: Area of pain on right heel/back of foot represented by red mark on diagram.  Sore area near her Achilles tendon.  Corn on right fifth toe location represented by blue mark on diagram Skin:    General: Skin is warm and dry.     Coloration: Skin is not jaundiced or pale.  Neurological:     Mental Status: She is alert and oriented to person, place, and time.     Gait: Gait normal.  Psychiatric:        Mood and Affect: Mood normal.        Behavior: Behavior normal.     Today's Vitals   02/26/19 1425  BP: 128/72  Pulse: 81  Resp: 20  Temp: (!) 97 F (36.1 C)  TempSrc: Temporal  SpO2: 94%  Weight: 207 lb (93.9 kg)  Height: 5\' 3"  (1.6 m)   Body mass index is 36.67 kg/m.     Assessment & Plan:    1. Urinary tract infection with hematuria, site unspecified  Positive for leukocytes and nitrites.  Will treat for UTI with Bactrim twice daily for 5 days.  She will increase water intake, avoid excess sugary caffeinated beverages advised to wear cotton underwear and always wipe front to back after using restroom.  - POCT Urinalysis Dipstick - Urine Culture - sulfamethoxazole-trimethoprim (BACTRIM DS) 800-160 MG tablet; Take 1 tablet by mouth 2 (two) times daily.  Dispense: 10 tablet; Refill: 0  2. Achilles pain We will get x-ray of foot to further investigate pain.  Referral to podiatry for evaluation and also to possibly shave down or remove corn.  - DG Foot Complete Right; Future - Ambulatory referral to Podiatry  3. Corn of foot  - Ambulatory referral to Podiatry  4. Need for immunization against  influenza  - Flu Vaccine QUAD High Dose(Fluad)   Patient will keep regularly scheduled follow-up with PCP as planned.  She will return to clinic sooner if any issues arise.

## 2019-02-26 NOTE — Patient Instructions (Signed)
Urinary Tract Infection, Adult A urinary tract infection (UTI) is an infection of any part of the urinary tract. The urinary tract includes:  The kidneys.  The ureters.  The bladder.  The urethra. These organs make, store, and get rid of pee (urine) in the body. What are the causes? This is caused by germs (bacteria) in your genital area. These germs grow and cause swelling (inflammation) of your urinary tract. What increases the risk? You are more likely to develop this condition if:  You have a small, thin tube (catheter) to drain pee.  You cannot control when you pee or poop (incontinence).  You are female, and: ? You use these methods to prevent pregnancy: ? A medicine that kills sperm (spermicide). ? A device that blocks sperm (diaphragm). ? You have low levels of a female hormone (estrogen). ? You are pregnant.  You have genes that add to your risk.  You are sexually active.  You take antibiotic medicines.  You have trouble peeing because of: ? A prostate that is bigger than normal, if you are female. ? A blockage in the part of your body that drains pee from the bladder (urethra). ? A kidney stone. ? A nerve condition that affects your bladder (neurogenic bladder). ? Not getting enough to drink. ? Not peeing often enough.  You have other conditions, such as: ? Diabetes. ? A weak disease-fighting system (immune system). ? Sickle cell disease. ? Gout. ? Injury of the spine. What are the signs or symptoms? Symptoms of this condition include:  Needing to pee right away (urgently).  Peeing often.  Peeing small amounts often.  Pain or burning when peeing.  Blood in the pee.  Pee that smells bad or not like normal.  Trouble peeing.  Pee that is cloudy.  Fluid coming from the vagina, if you are female.  Pain in the belly or lower back. Other symptoms include:  Throwing up (vomiting).  No urge to eat.  Feeling mixed up (confused).  Being tired  and grouchy (irritable).  A fever.  Watery poop (diarrhea). How is this treated? This condition may be treated with:  Antibiotic medicine.  Other medicines.  Drinking enough water. Follow these instructions at home:  Medicines  Take over-the-counter and prescription medicines only as told by your doctor.  If you were prescribed an antibiotic medicine, take it as told by your doctor. Do not stop taking it even if you start to feel better. General instructions  Make sure you: ? Pee until your bladder is empty. ? Do not hold pee for a long time. ? Empty your bladder after sex. ? Wipe from front to back after pooping if you are a female. Use each tissue one time when you wipe.  Drink enough fluid to keep your pee pale yellow.  Keep all follow-up visits as told by your doctor. This is important. Contact a doctor if:  You do not get better after 1-2 days.  Your symptoms go away and then come back. Get help right away if:  You have very bad back pain.  You have very bad pain in your lower belly.  You have a fever.  You are sick to your stomach (nauseous).  You are throwing up. Summary  A urinary tract infection (UTI) is an infection of any part of the urinary tract.  This condition is caused by germs in your genital area.  There are many risk factors for a UTI. These include having a small, thin   tube to drain pee and not being able to control when you pee or poop.  Treatment includes antibiotic medicines for germs.  Drink enough fluid to keep your pee pale yellow. This information is not intended to replace advice given to you by your health care provider. Make sure you discuss any questions you have with your health care provider. Document Released: 11/14/2007 Document Revised: 05/15/2018 Document Reviewed: 12/05/2017 Elsevier Patient Education  2020 [employer]  

## 2019-02-28 ENCOUNTER — Other Ambulatory Visit: Payer: Self-pay | Admitting: Internal Medicine

## 2019-02-28 DIAGNOSIS — M79671 Pain in right foot: Secondary | ICD-10-CM

## 2019-02-28 LAB — URINE CULTURE
MICRO NUMBER:: 893244
SPECIMEN QUALITY:: ADEQUATE

## 2019-02-28 NOTE — Progress Notes (Signed)
Order placed for podiatry referral.   

## 2019-03-09 ENCOUNTER — Other Ambulatory Visit: Payer: Self-pay | Admitting: Internal Medicine

## 2019-03-13 ENCOUNTER — Other Ambulatory Visit: Payer: Self-pay

## 2019-03-13 ENCOUNTER — Ambulatory Visit (INDEPENDENT_AMBULATORY_CARE_PROVIDER_SITE_OTHER): Payer: Medicare Other | Admitting: Internal Medicine

## 2019-03-13 ENCOUNTER — Encounter: Payer: Self-pay | Admitting: Internal Medicine

## 2019-03-13 DIAGNOSIS — F419 Anxiety disorder, unspecified: Secondary | ICD-10-CM

## 2019-03-13 DIAGNOSIS — K219 Gastro-esophageal reflux disease without esophagitis: Secondary | ICD-10-CM | POA: Diagnosis not present

## 2019-03-13 DIAGNOSIS — I10 Essential (primary) hypertension: Secondary | ICD-10-CM

## 2019-03-13 DIAGNOSIS — G20 Parkinson's disease: Secondary | ICD-10-CM

## 2019-03-13 DIAGNOSIS — E7800 Pure hypercholesterolemia, unspecified: Secondary | ICD-10-CM

## 2019-03-13 DIAGNOSIS — N183 Chronic kidney disease, stage 3 unspecified: Secondary | ICD-10-CM | POA: Diagnosis not present

## 2019-03-13 DIAGNOSIS — J449 Chronic obstructive pulmonary disease, unspecified: Secondary | ICD-10-CM | POA: Diagnosis not present

## 2019-03-13 DIAGNOSIS — R739 Hyperglycemia, unspecified: Secondary | ICD-10-CM

## 2019-03-13 DIAGNOSIS — F339 Major depressive disorder, recurrent, unspecified: Secondary | ICD-10-CM

## 2019-03-13 DIAGNOSIS — R7989 Other specified abnormal findings of blood chemistry: Secondary | ICD-10-CM

## 2019-03-13 NOTE — Progress Notes (Addendum)
Patient ID: Victoria Hanson, female   DOB: 1950-12-08, 68 y.o.   MRN: [id number]   Virtual Visit via telephone Note  This visit type was conducted due to national recommendations for restrictions regarding the COVID-19 pandemic (e.g. social distancing).  This format is felt to be most appropriate for this patient at this time.  All issues noted in this document were discussed and addressed.  No physical exam was performed (except for noted visual exam findings with Video Visits).   I connected with [name] by telephone and verified that I am speaking with the correct person using two identifiers. Location patient: home Location provider: work Persons participating in the telephone visit: patient, provider  I discussed the limitations, risks, security and privacy concerns of performing an evaluation and management service by telephone and the availability of in person appointments. The patient expressed understanding and agreed to proceed.   Reason for visit: scheduled follow up.    HPI: Recently evaluated and treated for UTI - Bactrim.  This has cleared.  Persistent pain in her foot/heel/achilles.  Xray revealed soft tissue swelling at the distal achilles tendon.  Persistent pain.  Discussed referral to podiatry.  Order already placed.  She has questions about her diagnosis of parkinsons and the treatment.  Discussed the need to discuss with neurology.  She will call and get an earlier appt.  Blood pressure is doing well.  Not on lasix.  Trying to stay hydrated.  Discussed evaluation for sleep apnea.  She declines.  Will notify me when agreeable.  Discussed the need to use her advair on a regular basis.  Can use the albuterol if needed.  Handling stress.     ROS: See pertinent positives and negatives per HPI.  Past Medical History:  Diagnosis Date  . Anxiety   . Asthma   . Atypical chest pain    a. 2003 Cath: reportedly nl per pt [name]);  b. 04/2015 [place] Admission, neg troponin.   . Colon cancer ([place]) 1999   a. 1999: Pt had partial colectomy. Did develop lung metastasis. Had right upper lobectomy in 01/2007 then had associated chemotherapy.  Marland Kitchen COPD (chronic obstructive pulmonary disease) ([place])   . DDD (degenerative disc disease), lumbosacral   . Depression   . Echocardiogram abnormal    a. 02/2010 Echo: EF 55-60%, mild LAE, no regional wall motion abnormalities  . Elevated transaminase level    a. ? NASH;  b. 06/2015 - nl LFTs.  . GERD (gastroesophageal reflux disease)   . Hematuria   . Holter monitor, abnormal    a. 12/2009 Holter monitoring: NSR, rare PACs and PVCs.  Marland Kitchen Hypercholesterolemia   . Hyperlipidemia   . Hypertension   . Leaky heart valve    a. Per pt report - no evidence of valvular abnormalities on prior echoes.  . Lesion of skin of Right breast 10/20/2017  . Lung metastases ([place]) 2008   a. S/P resection (lobectomy - right)  . Morbid obesity ([place])   . Nephrolithiasis   . Parkinson disease ([place]) 08/2016   [name]    Past Surgical History:  Procedure Laterality Date  . BREAST EXCISIONAL BIOPSY Right 10/07/2009   benign  . CARDIAC CATHETERIZATION  2003   negative as per pt report  . CHOLECYSTECTOMY  2000  . COLON SURGERY     Colon cancer, removed part of large colon  . COLONOSCOPY N/A 04/25/2015   Procedure: COLONOSCOPY;  Surgeon: Manya Silvas, MD;  Location: [facility] ENDOSCOPY;  Service:  Endoscopy;  Laterality: N/A;  . COLONOSCOPY WITH PROPOFOL N/A 06/14/2017   Procedure: COLONOSCOPY WITH PROPOFOL;  Surgeon: Manya Silvas, MD;  Location: [facility] ENDOSCOPY;  Service: Endoscopy;  Laterality: N/A;  . LOBECTOMY  01/2007   Right lung, upper lobe right side removed  . TOTAL ABDOMINAL HYSTERECTOMY     abnormal bleeding  . TUBAL LIGATION  1980    Family History  Problem Relation Age of Onset  . Coronary artery disease Mother        died @ 62 of MI.  . Mental illness Mother   . Diabetes Mother   . Heart attack Father 53       MI  . Cancer  Father        lung - died in his mid-70's  . Hypertension Father   . Diabetes Father   . Sudden death Brother   . Mental illness Brother   . Heart attack Brother        MI @ 96, died @ age 63.  . Breast cancer Neg Hx     SOCIAL HX: reviewed.    Current Outpatient Medications:  .  ALPRAZolam (XANAX) 0.5 MG tablet, 1/2 tablet q day prn, Disp: 30 tablet, Rfl: 0 .  aspirin EC 81 MG tablet, Take 81 mg by mouth as needed. , Disp: , Rfl:  .  citalopram (CELEXA) 10 MG tablet, Take 1 tablet (10 mg total) by mouth daily., Disp: 90 tablet, Rfl: 1 .  ferrous sulfate 325 (65 FE) MG tablet, Take 325 mg by mouth daily with breakfast., Disp: , Rfl:  .  fluticasone (FLONASE) 50 MCG/ACT nasal spray, Place 2 sprays into both nostrils daily., Disp: 16 g, Rfl: 6 .  fluticasone-salmeterol (ADVAIR HFA) 115-21 MCG/ACT inhaler, Inhale 2 puffs into the lungs 2 (two) times daily., Disp: 1 Inhaler, Rfl: 12 .  hydrochlorothiazide (HYDRODIURIL) 12.5 MG tablet, Take 1 tablet (12.5 mg total) by mouth daily., Disp: 90 tablet, Rfl: 1 .  Multiple Vitamin (MULTIVITAMIN) tablet, Take 1 tablet by mouth daily., Disp: , Rfl:  .  omeprazole (PRILOSEC) 20 MG capsule, TAKE 1 CAPSULE BY MOUTH DAILY, Disp: 90 capsule, Rfl: 1 .  potassium chloride SA (K-DUR) 20 MEQ tablet, TAKE 1 TABLET BY MOUTH TWICE A DAY, Disp: 180 tablet, Rfl: 1 .  traZODone (DESYREL) 50 MG tablet, TAKE 2 TABLETS BY MOUTH AT BEDTIME, Disp: 180 tablet, Rfl: 1 .  VENTOLIN HFA 108 (90 Base) MCG/ACT inhaler, INHALE 2 PUFFS BY MOUTH INTO THE LUNGS EVERY 6 HOURS AS NEEDED FOR WHEEZING, Disp: 18 g, Rfl: 1 .  vitamin B-12 (CYANOCOBALAMIN) 100 MCG tablet, Take 100 mcg by mouth daily., Disp: , Rfl:  .  furosemide (LASIX) 20 MG tablet, , Disp: , Rfl:  .  pravastatin (PRAVACHOL) 10 MG tablet, TAKE 1 TABLET BY MOUTH DAILY (Patient not taking: Reported on 03/13/2019), Disp: 90 tablet, Rfl: 1  EXAM:  VITALS per patient if applicable:  944/96  GENERAL: alert. Sounds to be  in no acute distress.  Answering questions appropriately.    PSYCH/NEURO: pleasant and cooperative, no obvious depression or anxiety, speech and thought processing grossly intact  ASSESSMENT AND PLAN:  Discussed the following assessment and plan:  Acid reflux Controlled on current regimen.    Anxiety, mild On citalopram.  Stable.    Chronic obstructive pulmonary disease (HCC) Seeing Dr Alva Garnet.  Discussed using advair regularly.  Albuterol prn.  Follow.    CKD (chronic kidney disease), stage III Stay hydrated.  Follow  metabolic panel.   Depression, recurrent ([place]) Stable on current medication regimen.  Follow.   Elevated liver function tests Follow liver function tests.  Diet and exercise.    Hypercholesterolemia Low cholesterol diet and exercise.  Follow lipid panel and liver function tests.    Hyperglycemia Low carb diet and exercise.  Follow met b and a1c.    Hypertension Blood pressure under good control.  Continue same medication regimen.  Follow pressures.  Follow metabolic panel.    Parkinson's disease ([place]) Followed by neurology.  Had questions about her medication and diagnosis.  Will make earlier appt and discuss with neurology.      I discussed the assessment and treatment plan with the patient. The patient was provided an opportunity to ask questions and all were answered. The patient agreed with the plan and demonstrated an understanding of the instructions.   The patient was advised to call back or seek an in-person evaluation if the symptoms worsen or if the condition fails to improve as anticipated.  I provided 28 minutes of non-face-to-face time during this encounter.   Einar Pheasant, MD

## 2019-03-15 NOTE — Assessment & Plan Note (Signed)
Seeing Dr Alva Garnet.  Discussed using advair regularly.  Albuterol prn.  Follow.

## 2019-03-15 NOTE — Assessment & Plan Note (Signed)
Low carb diet and exercise.  Follow met b and a1c.   

## 2019-03-15 NOTE — Assessment & Plan Note (Signed)
Followed by neurology.  Had questions about her medication and diagnosis.  Will make earlier appt and discuss with neurology.

## 2019-03-15 NOTE — Assessment & Plan Note (Signed)
Stable on current medication regimen.  Follow.

## 2019-03-15 NOTE — Assessment & Plan Note (Signed)
On citalopram.  Stable.

## 2019-03-15 NOTE — Assessment & Plan Note (Signed)
Blood pressure under good control.  Continue same medication regimen.  Follow pressures.  Follow metabolic panel.   

## 2019-03-15 NOTE — Assessment & Plan Note (Signed)
Controlled on current regimen.   

## 2019-03-15 NOTE — Assessment & Plan Note (Signed)
Stay hydrated.  Follow metabolic panel.

## 2019-03-15 NOTE — Assessment & Plan Note (Signed)
Low cholesterol diet and exercise.  Follow lipid panel and liver function tests.  

## 2019-03-15 NOTE — Assessment & Plan Note (Signed)
Follow liver function tests.  Diet and exercise.

## 2019-03-17 ENCOUNTER — Other Ambulatory Visit: Payer: Self-pay | Admitting: Internal Medicine

## 2019-03-17 ENCOUNTER — Telehealth: Payer: Self-pay

## 2019-03-17 NOTE — Telephone Encounter (Signed)
Pt scheduled  

## 2019-03-17 NOTE — Telephone Encounter (Signed)
Copied from [place] [id number]. Topic: General - Other >> Mar 17, 2019 11:10 AM Rainey Pines A wrote: Patient would like a callback from nurse on it Dr. Nicki Reaper would like her to complete bloodwork on her appt for 04/17/2019

## 2019-03-17 NOTE — Telephone Encounter (Signed)
Please schedule her for fasting lab appt.

## 2019-03-17 NOTE — Telephone Encounter (Signed)
The lab orders are in the computer.  Ok to schedule labs.

## 2019-03-19 ENCOUNTER — Other Ambulatory Visit: Payer: Self-pay | Admitting: Internal Medicine

## 2019-03-24 ENCOUNTER — Ambulatory Visit: Payer: Medicare Other

## 2019-03-24 ENCOUNTER — Ambulatory Visit: Payer: Medicare Other | Admitting: Podiatry

## 2019-03-24 ENCOUNTER — Other Ambulatory Visit: Payer: Self-pay

## 2019-03-24 ENCOUNTER — Encounter: Payer: Self-pay | Admitting: Podiatry

## 2019-03-24 DIAGNOSIS — M7661 Achilles tendinitis, right leg: Secondary | ICD-10-CM

## 2019-03-24 DIAGNOSIS — B351 Tinea unguium: Secondary | ICD-10-CM

## 2019-03-24 DIAGNOSIS — M79676 Pain in unspecified toe(s): Secondary | ICD-10-CM | POA: Diagnosis not present

## 2019-03-24 MED ORDER — MELOXICAM 15 MG PO TABS: 15.0000 mg | ORAL_TABLET | Freq: Every day | ORAL | 1 refills | Status: DC

## 2019-03-24 MED ORDER — METHYLPREDNISOLONE 4 MG PO TBPK: ORAL_TABLET | ORAL | 0 refills | Status: DC

## 2019-03-27 NOTE — Progress Notes (Signed)
HPI: 68 y.o. female presenting today as a new patient with a chief complaint of constant sharp pain noted to the right heel and Achilles area that began about one month ago. She reports a painful nodule to the posterior heel/Achilles. Touching the area and walking increases the pain. She states it is worse when she first gets out of bed in the morning. She has not done anything for treatment.  She also complains of elongated, thickened nails 1-5 bilaterally that cause pain with ambulation in shoes. She is unable to trim her own nails. Patient is here for further evaluation and treatment.    Past Medical History:  Diagnosis Date  . Anxiety   . Asthma   . Atypical chest pain    a. 2003 Cath: reportedly nl per pt [name]);  b. 04/2015 [place] Admission, neg troponin.  . Colon cancer ([place]) 1999   a. 1999: Pt had partial colectomy. Did develop lung metastasis. Had right upper lobectomy in 01/2007 then had associated chemotherapy.  Marland Kitchen COPD (chronic obstructive pulmonary disease) ([place])   . DDD (degenerative disc disease), lumbosacral   . Depression   . Echocardiogram abnormal    a. 02/2010 Echo: EF 55-60%, mild LAE, no regional wall motion abnormalities  . Elevated transaminase level    a. ? NASH;  b. 06/2015 - nl LFTs.  . GERD (gastroesophageal reflux disease)   . Hematuria   . Holter monitor, abnormal    a. 12/2009 Holter monitoring: NSR, rare PACs and PVCs.  Marland Kitchen Hypercholesterolemia   . Hyperlipidemia   . Hypertension   . Leaky heart valve    a. Per pt report - no evidence of valvular abnormalities on prior echoes.  . Lesion of skin of Right breast 10/20/2017  . Lung metastases ([place]) 2008   a. S/P resection (lobectomy - right)  . Morbid obesity ([place])   . Nephrolithiasis   . Parkinson disease ([place]) 08/2016   [name]      Physical Exam: General: The patient is alert and oriented x3 in no acute distress.  Dermatology: Nails are tender, long, thickened and dystrophic with subungual  debris, consistent with onychomycosis, 1-5 bilateral. No signs of infection noted. Skin is warm, dry and supple bilateral lower extremities. Negative for open lesions or macerations.  Vascular: Palpable pedal pulses bilaterally. No edema or erythema noted. Capillary refill within normal limits.  Neurological: Epicritic and protective threshold grossly intact bilaterally.   Musculoskeletal Exam: Pain on palpation noted to the posterior tubercle of the right calcaneus at the insertion of the Achilles tendon consistent with retrocalcaneal bursitis. Range of motion within normal limits. Muscle strength 5/5 in all muscle groups bilateral lower extremities.  Assessment: 1. Insertional Achilles tendinitis right  2. Onychodystrophic nails 1-5 bilateral with hyperkeratosis of nails.  3. Onychomycosis of nail due to dermatophyte bilateral    Plan of Care:  1. Patient was evaluated. X-Rays in Epic reviewed.  2. Injection of 0.5 mL Celestone Soluspan injected into the retrocalcaneal bursa. Care was taken to avoid direct injection into the Achilles tendon. 3. Prescription for Medrol Dose Pak provided to patient. 4. Prescription for Meloxicam provided to patient. 5. CAM boot dispensed.  6. Mechanical debridement of nails 1-5 bilaterally performed using a nail nipper. Filed with dremel without incident.  7. Return to clinic in 4 weeks.   Sister is Stanton Kidney.     Edrick Kins, DPM [facility]  Dr. Edrick Kins, DPM    [address]  [place], [place] [address]                Office [phone]  Fax [phone]

## 2019-04-02 ENCOUNTER — Telehealth: Payer: Self-pay

## 2019-04-02 NOTE — Telephone Encounter (Signed)
Patient called and stated that the Meloxicam is causing a lot of vertigo and wanted to know what else she can take.  Please advise   Thanks!

## 2019-04-05 ENCOUNTER — Other Ambulatory Visit: Payer: Self-pay | Admitting: Podiatry

## 2019-04-05 MED ORDER — IBUPROFEN 800 MG PO TABS: 800.0000 mg | ORAL_TABLET | Freq: Three times a day (TID) | ORAL | 1 refills | Status: DC

## 2019-04-05 NOTE — Telephone Encounter (Signed)
Discontinue Meloxicam. Rx for Motrin 800 sent to the pharmacy. - Dr. Amalia Hailey

## 2019-04-06 ENCOUNTER — Other Ambulatory Visit: Payer: Self-pay | Admitting: Internal Medicine

## 2019-04-06 NOTE — Telephone Encounter (Signed)
Patient notified of medication

## 2019-04-15 ENCOUNTER — Other Ambulatory Visit: Payer: Self-pay

## 2019-04-15 ENCOUNTER — Other Ambulatory Visit (INDEPENDENT_AMBULATORY_CARE_PROVIDER_SITE_OTHER): Payer: Medicare Other

## 2019-04-15 DIAGNOSIS — I10 Essential (primary) hypertension: Secondary | ICD-10-CM | POA: Diagnosis not present

## 2019-04-15 DIAGNOSIS — R739 Hyperglycemia, unspecified: Secondary | ICD-10-CM

## 2019-04-15 DIAGNOSIS — E7800 Pure hypercholesterolemia, unspecified: Secondary | ICD-10-CM | POA: Diagnosis not present

## 2019-04-15 LAB — BASIC METABOLIC PANEL
BUN: 20 mg/dL (ref 6–23)
CO2: 29 mEq/L (ref 19–32)
Calcium: 9.1 mg/dL (ref 8.4–10.5)
Chloride: 103 mEq/L (ref 96–112)
Creatinine, Ser: 1.25 mg/dL — ABNORMAL HIGH (ref 0.40–1.20)
GFR: 42.44 mL/min — ABNORMAL LOW (ref >60.00)
Glucose, Bld: 112 mg/dL — ABNORMAL HIGH (ref 70–99)
Potassium: 4.6 mEq/L (ref 3.5–5.1)
Sodium: 139 mEq/L (ref 135–145)

## 2019-04-15 LAB — LIPID PANEL
Cholesterol: 245 mg/dL — ABNORMAL HIGH (ref 0–200)
HDL: 59.7 mg/dL (ref >39.00)
LDL Cholesterol: 162 mg/dL — ABNORMAL HIGH (ref 0–99)
NonHDL: 185.6
Total CHOL/HDL Ratio: 4
Triglycerides: 118 mg/dL (ref 0.0–149.0)
VLDL: 23.6 mg/dL (ref 0.0–40.0)

## 2019-04-15 LAB — CBC WITH DIFFERENTIAL/PLATELET
Basophils Absolute: 0.1 10*3/uL (ref 0.0–0.1)
Basophils Relative: 0.9 % (ref 0.0–3.0)
Eosinophils Absolute: 0.1 10*3/uL (ref 0.0–0.7)
Eosinophils Relative: 1.6 % (ref 0.0–5.0)
HCT: 36.8 % (ref 36.0–46.0)
Hemoglobin: 12.1 g/dL (ref 12.0–15.0)
Lymphocytes Relative: 22.2 % (ref 12.0–46.0)
Lymphs Abs: 1.4 10*3/uL (ref 0.7–4.0)
MCHC: 32.9 g/dL (ref 30.0–36.0)
MCV: 88 fl (ref 78.0–100.0)
Monocytes Absolute: 0.4 10*3/uL (ref 0.1–1.0)
Monocytes Relative: 6.5 % (ref 3.0–12.0)
Neutro Abs: 4.3 10*3/uL (ref 1.4–7.7)
Neutrophils Relative %: 68.8 % (ref 43.0–77.0)
Platelets: 227 10*3/uL (ref 150.0–400.0)
RBC: 4.18 Mil/uL (ref 3.87–5.11)
RDW: 13.8 % (ref 11.5–15.5)
WBC: 6.3 10*3/uL (ref 4.0–10.5)

## 2019-04-15 LAB — HEPATIC FUNCTION PANEL
ALT: 32 U/L (ref 0–35)
AST: 39 U/L — ABNORMAL HIGH (ref 0–37)
Albumin: 3.8 g/dL (ref 3.5–5.2)
Alkaline Phosphatase: 110 U/L (ref 39–117)
Bilirubin, Direct: 0.1 mg/dL (ref 0.0–0.3)
Total Bilirubin: 0.4 mg/dL (ref 0.2–1.2)
Total Protein: 6.3 g/dL (ref 6.0–8.3)

## 2019-04-15 LAB — HEMOGLOBIN A1C: Hgb A1c MFr Bld: 6.1 % (ref 4.6–6.5)

## 2019-04-17 ENCOUNTER — Ambulatory Visit (INDEPENDENT_AMBULATORY_CARE_PROVIDER_SITE_OTHER): Payer: Medicare Other | Admitting: Internal Medicine

## 2019-04-17 ENCOUNTER — Other Ambulatory Visit: Payer: Self-pay

## 2019-04-17 DIAGNOSIS — F419 Anxiety disorder, unspecified: Secondary | ICD-10-CM | POA: Diagnosis not present

## 2019-04-17 DIAGNOSIS — E538 Deficiency of other specified B group vitamins: Secondary | ICD-10-CM

## 2019-04-17 DIAGNOSIS — I10 Essential (primary) hypertension: Secondary | ICD-10-CM

## 2019-04-17 DIAGNOSIS — N183 Chronic kidney disease, stage 3 unspecified: Secondary | ICD-10-CM

## 2019-04-17 DIAGNOSIS — D649 Anemia, unspecified: Secondary | ICD-10-CM

## 2019-04-17 DIAGNOSIS — J449 Chronic obstructive pulmonary disease, unspecified: Secondary | ICD-10-CM

## 2019-04-17 DIAGNOSIS — R7989 Other specified abnormal findings of blood chemistry: Secondary | ICD-10-CM

## 2019-04-17 DIAGNOSIS — R739 Hyperglycemia, unspecified: Secondary | ICD-10-CM

## 2019-04-17 DIAGNOSIS — G20 Parkinson's disease: Secondary | ICD-10-CM

## 2019-04-17 DIAGNOSIS — F339 Major depressive disorder, recurrent, unspecified: Secondary | ICD-10-CM

## 2019-04-17 DIAGNOSIS — E7800 Pure hypercholesterolemia, unspecified: Secondary | ICD-10-CM

## 2019-04-17 DIAGNOSIS — K219 Gastro-esophageal reflux disease without esophagitis: Secondary | ICD-10-CM

## 2019-04-17 NOTE — Progress Notes (Signed)
Patient ID: KATURAH KARAPETIAN, female   DOB: 11/04/1950, 68 y.o.   MRN: [id number]   Virtual Visit via telephone Note  This visit type was conducted due to national recommendations for restrictions regarding the COVID-19 pandemic (e.g. social distancing).  This format is felt to be most appropriate for this patient at this time.  All issues noted in this document were discussed and addressed.  No physical exam was performed (except for noted visual exam findings with Video Visits).   I connected with [name] by telephone and verified that I am speaking with the correct person using two identifiers. Location patient: home Location provider: work  Persons participating in the telephone visit: patient, provider  I discussed the limitations, risks, security and privacy concerns of performing an evaluation and management service by telephone and the availability of in person appointments. The patient expressed understanding and agreed to proceed.   Reason for visit: scheduled follow up  HPI: She reports she is doing relatively well.  Seeing Dr Amalia Hailey for f/u achilles tendonitis.  Is doing some better.  Handling stress.  No chest pain.  Breathing stable.  Discussed again evaluation for sleep apnea.  She feels she is sleeping well.  Energy is better.  No increased fatigue.  Feels doing better.  Declines sleep study.  No acid reflux.  No abdominal pain.  Bowels moving.  Has f/u with neurology next week - f/u parkinsons.  Plans to discuss her medications with neurology.  Handling stress.    ROS: See pertinent positives and negatives per HPI.  Past Medical History:  Diagnosis Date  . Anxiety   . Asthma   . Atypical chest pain    a. 2003 Cath: reportedly nl per pt [name]);  b. 04/2015 [place] Admission, neg troponin.  . Colon cancer ([place]) 1999   a. 1999: Pt had partial colectomy. Did develop lung metastasis. Had right upper lobectomy in 01/2007 then had associated chemotherapy.  Marland Kitchen COPD (chronic  obstructive pulmonary disease) ([place])   . DDD (degenerative disc disease), lumbosacral   . Depression   . Echocardiogram abnormal    a. 02/2010 Echo: EF 55-60%, mild LAE, no regional wall motion abnormalities  . Elevated transaminase level    a. ? NASH;  b. 06/2015 - nl LFTs.  . GERD (gastroesophageal reflux disease)   . Hematuria   . Holter monitor, abnormal    a. 12/2009 Holter monitoring: NSR, rare PACs and PVCs.  Marland Kitchen Hypercholesterolemia   . Hyperlipidemia   . Hypertension   . Leaky heart valve    a. Per pt report - no evidence of valvular abnormalities on prior echoes.  . Lesion of skin of Right breast 10/20/2017  . Lung metastases ([place]) 2008   a. S/P resection (lobectomy - right)  . Morbid obesity ([place])   . Nephrolithiasis   . Parkinson disease ([place]) 08/2016   [name]    Past Surgical History:  Procedure Laterality Date  . BREAST EXCISIONAL BIOPSY Right 10/07/2009   benign  . CARDIAC CATHETERIZATION  2003   negative as per pt report  . CHOLECYSTECTOMY  2000  . COLON SURGERY     Colon cancer, removed part of large colon  . COLONOSCOPY N/A 04/25/2015   Procedure: COLONOSCOPY;  Surgeon: Manya Silvas, MD;  Location: [facility] ENDOSCOPY;  Service: Endoscopy;  Laterality: N/A;  . COLONOSCOPY WITH PROPOFOL N/A 06/14/2017   Procedure: COLONOSCOPY WITH PROPOFOL;  Surgeon: Manya Silvas, MD;  Location: [facility] ENDOSCOPY;  Service: Endoscopy;  Laterality:  N/A;  . LOBECTOMY  01/2007   Right lung, upper lobe right side removed  . TOTAL ABDOMINAL HYSTERECTOMY     abnormal bleeding  . TUBAL LIGATION  1980    Family History  Problem Relation Age of Onset  . Coronary artery disease Mother        died @ 58 of MI.  . Mental illness Mother   . Diabetes Mother   . Heart attack Father 38       MI  . Cancer Father        lung - died in his mid-70's  . Hypertension Father   . Diabetes Father   . Sudden death Brother   . Mental illness Brother   . Heart attack Brother        MI @ 10,  died @ age 40.  . Breast cancer Neg Hx     SOCIAL HX: reviewed.    Current Outpatient Medications:  .  ALPRAZolam (XANAX) 0.5 MG tablet, 1/2 tablet q day prn, Disp: 30 tablet, Rfl: 0 .  aspirin EC 81 MG tablet, Take 81 mg by mouth as needed. , Disp: , Rfl:  .  carbidopa-levodopa (SINEMET CR) 50-200 MG tablet, TAKE 1 TABLET BY MOUTH NIGHTLY, Disp: , Rfl:  .  citalopram (CELEXA) 10 MG tablet, TAKE 1 TABLET BY MOUTH DAILY, Disp: 90 tablet, Rfl: 1 .  ferrous sulfate 325 (65 FE) MG tablet, Take 325 mg by mouth daily with breakfast., Disp: , Rfl:  .  fluticasone (FLONASE) 50 MCG/ACT nasal spray, Place 2 sprays into both nostrils daily., Disp: 16 g, Rfl: 6 .  fluticasone-salmeterol (ADVAIR HFA) 115-21 MCG/ACT inhaler, Inhale 2 puffs into the lungs 2 (two) times daily., Disp: 1 Inhaler, Rfl: 12 .  hydrochlorothiazide (HYDRODIURIL) 12.5 MG tablet, TAKE 1 TABLET BY MOUTH DAILY, Disp: 90 tablet, Rfl: 1 .  ibuprofen (ADVIL) 800 MG tablet, Take 1 tablet (800 mg total) by mouth 3 (three) times daily., Disp: 90 tablet, Rfl: 1 .  Multiple Vitamin (MULTIVITAMIN) tablet, Take 1 tablet by mouth daily., Disp: , Rfl:  .  omeprazole (PRILOSEC) 20 MG capsule, TAKE 1 CAPSULE BY MOUTH DAILY, Disp: 90 capsule, Rfl: 1 .  potassium chloride SA (K-DUR) 20 MEQ tablet, TAKE 1 TABLET BY MOUTH TWICE A DAY, Disp: 180 tablet, Rfl: 1 .  traZODone (DESYREL) 50 MG tablet, TAKE 2 TABLETS BY MOUTH AT BEDTIME, Disp: 180 tablet, Rfl: 1 .  VENTOLIN HFA 108 (90 Base) MCG/ACT inhaler, INHALE 2 PUFFS BY MOUTH INTO THE LUNGS EVERY 6 HOURS AS NEEDED FOR WHEEZING, Disp: 18 g, Rfl: 1 .  vitamin B-12 (CYANOCOBALAMIN) 100 MCG tablet, Take 100 mcg by mouth daily., Disp: , Rfl:   EXAM:  GENERAL: alert.  Answering question appropriately.  Sounds to be in no acute distress.    PSYCH/NEURO: pleasant and cooperative, no obvious depression or anxiety, speech and thought processing grossly intact  ASSESSMENT AND PLAN:  Discussed the following  assessment and plan:  Anxiety, mild On citalopram.  Doing better.  Follow.    B12 deficiency On oral B12.  Change dose to 1051mg oral B12.    Chronic obstructive pulmonary disease (HCC) Was seeing Dr SAlva Garnet  Breathing stable - actually improved.  Follow.    CKD (chronic kidney disease), stage III Stay hydrated.  Avoid antiinflammatories.  Follow metabolic panel.   Depression, recurrent ([place] On citalopram.  Doing well.   Elevated liver function tests Follow liver function tests.    GERD (gastroesophageal reflux  disease) Controlled.    Hypercholesterolemia Low cholesterol diet and exercise.  Follow lipid panel.   Hyperglycemia Low carb diet and exercise.  Follow met b and a1c.   Hypertension Blood pressure has been doing well.  Follow.    Parkinson's disease ([place]) Followed by neurology.  Has f/u planned for next week.      I discussed the assessment and treatment plan with the patient. The patient was provided an opportunity to ask questions and all were answered. The patient agreed with the plan and demonstrated an understanding of the instructions.   The patient was advised to call back or seek an in-person evaluation if the symptoms worsen or if the condition fails to improve as anticipated.  I provided 17 minutes of non-face-to-face time during this encounter.   Einar Pheasant, MD

## 2019-04-19 ENCOUNTER — Encounter: Payer: Self-pay | Admitting: Internal Medicine

## 2019-04-19 NOTE — Assessment & Plan Note (Signed)
Follow liver function tests.

## 2019-04-19 NOTE — Assessment & Plan Note (Signed)
On citalopram.  Doing well.

## 2019-04-19 NOTE — Assessment & Plan Note (Signed)
Low cholesterol diet and exercise.  Follow lipid panel.   

## 2019-04-19 NOTE — Assessment & Plan Note (Signed)
Blood pressure has been doing well.  Follow.   

## 2019-04-19 NOTE — Assessment & Plan Note (Signed)
Low carb diet and exercise.  Follow met b and a1c.  

## 2019-04-19 NOTE — Assessment & Plan Note (Signed)
Was seeing Dr Alva Garnet.  Breathing stable - actually improved.  Follow.

## 2019-04-19 NOTE — Assessment & Plan Note (Signed)
Followed by neurology.  Has f/u planned for next week.

## 2019-04-19 NOTE — Assessment & Plan Note (Signed)
Stay hydrated.  Avoid antiinflammatories.  Follow metabolic panel.

## 2019-04-19 NOTE — Assessment & Plan Note (Signed)
On oral B12.  Change dose to 1045mcg oral B12.

## 2019-04-19 NOTE — Assessment & Plan Note (Signed)
On citalopram.  Doing better.  Follow.

## 2019-04-19 NOTE — Assessment & Plan Note (Signed)
Controlled.  

## 2019-04-21 ENCOUNTER — Ambulatory Visit: Payer: Medicare Other | Admitting: Podiatry

## 2019-04-24 ENCOUNTER — Telehealth: Payer: Self-pay | Admitting: *Deleted

## 2019-04-24 NOTE — Telephone Encounter (Signed)
Please call and schedule pt

## 2019-04-24 NOTE — Telephone Encounter (Signed)
Copied from [place] [id number]. Topic: Appointment Scheduling - Scheduling Inquiry for Clinic >> Apr 24, 2019  4:01 PM Victoria Hanson wrote: Patient calling to schedule 1 mo fu with Dr. Nicki Reaper for December. Unable to schedule due to no availability.

## 2019-04-27 ENCOUNTER — Telehealth: Payer: Self-pay | Admitting: Internal Medicine

## 2019-04-27 DIAGNOSIS — G20 Parkinson's disease: Secondary | ICD-10-CM | POA: Diagnosis not present

## 2019-04-27 DIAGNOSIS — G4752 REM sleep behavior disorder: Secondary | ICD-10-CM | POA: Diagnosis not present

## 2019-04-27 DIAGNOSIS — G479 Sleep disorder, unspecified: Secondary | ICD-10-CM | POA: Diagnosis not present

## 2019-04-27 DIAGNOSIS — R413 Other amnesia: Secondary | ICD-10-CM | POA: Diagnosis not present

## 2019-04-27 NOTE — Telephone Encounter (Signed)
Tried to call pt again and line was busy

## 2019-04-27 NOTE — Telephone Encounter (Signed)
Patient is calling to let Dr. Nicki Reaper know that the neurologist recommended the patient to return to pravastatin because the cholesterol is elevated. Please advise [facility]

## 2019-04-27 NOTE — Telephone Encounter (Signed)
Called pt line was busy will try again later

## 2019-04-28 NOTE — Telephone Encounter (Signed)
Neurology was supposed to look over her medications. Wanting to restart her on pravastatin. Are you ok to restart her on cholesterol medication?

## 2019-04-28 NOTE — Telephone Encounter (Signed)
I am ok to restart a cholesterol medication.  Does she want to restart pravastatin.  If wants to try a different cholesterol medication, can start crestor 5mg  and take q Monday, Wednesday and Friday.  Will need liver panel checked 6 weeks after starting cholesterol medication.  If does start will need to put in orders for liver panel.

## 2019-04-30 NOTE — Telephone Encounter (Signed)
LMTCB

## 2019-05-05 ENCOUNTER — Ambulatory Visit: Payer: Medicare Other | Admitting: Pulmonary Disease

## 2019-05-05 ENCOUNTER — Other Ambulatory Visit: Payer: Self-pay

## 2019-05-05 MED ORDER — PRAVASTATIN SODIUM 10 MG PO TABS: 10.0000 mg | ORAL_TABLET | Freq: Every day | ORAL | 0 refills | Status: DC

## 2019-05-05 NOTE — Telephone Encounter (Signed)
Pt started back on pravastatin 10 mg q day. Will recheck liver panel with fasting labs 05/2019. Sent in new rx for patient.

## 2019-05-08 ENCOUNTER — Other Ambulatory Visit: Payer: Self-pay | Admitting: Internal Medicine

## 2019-06-08 ENCOUNTER — Telehealth: Payer: Self-pay | Admitting: Pulmonary Disease

## 2019-06-08 ENCOUNTER — Other Ambulatory Visit: Payer: Medicare Other

## 2019-06-08 NOTE — Telephone Encounter (Signed)
Former DS pt instructed to f/u around 03/2019 with Dr. Patsey Berthold. Pt would like to hold off on appt until 09/2019, as the cooler weather bothers her breathing. Pt would like enough refills on Advair to last until next appt. Recall has been placed for 09/2019.  Dr. Patsey Berthold, please advise. Thanks

## 2019-06-09 ENCOUNTER — Ambulatory Visit: Payer: Medicare Other | Admitting: Pulmonary Disease

## 2019-06-09 NOTE — Telephone Encounter (Signed)
Ok to refill meds until F/U for this one time.

## 2019-06-09 NOTE — Telephone Encounter (Signed)
Lm for pt

## 2019-06-10 MED ORDER — ADVAIR HFA 115-21 MCG/ACT IN AERO: 2.0000 | INHALATION_SPRAY | Freq: Two times a day (BID) | RESPIRATORY_TRACT | 4 refills | Status: DC

## 2019-06-10 NOTE — Telephone Encounter (Signed)
Pt is aware of below message and voiced her understanding.  Rx for Advair has been sent to preferred pharmacy.  Nothing further is needed.  

## 2019-06-22 ENCOUNTER — Other Ambulatory Visit: Payer: Self-pay

## 2019-06-22 ENCOUNTER — Ambulatory Visit (INDEPENDENT_AMBULATORY_CARE_PROVIDER_SITE_OTHER): Payer: Medicare Other | Admitting: Internal Medicine

## 2019-06-22 DIAGNOSIS — E7800 Pure hypercholesterolemia, unspecified: Secondary | ICD-10-CM

## 2019-06-22 DIAGNOSIS — N183 Chronic kidney disease, stage 3 unspecified: Secondary | ICD-10-CM | POA: Diagnosis not present

## 2019-06-22 DIAGNOSIS — F339 Major depressive disorder, recurrent, unspecified: Secondary | ICD-10-CM

## 2019-06-22 DIAGNOSIS — I10 Essential (primary) hypertension: Secondary | ICD-10-CM

## 2019-06-22 DIAGNOSIS — F419 Anxiety disorder, unspecified: Secondary | ICD-10-CM

## 2019-06-22 DIAGNOSIS — G20 Parkinson's disease: Secondary | ICD-10-CM

## 2019-06-22 DIAGNOSIS — J449 Chronic obstructive pulmonary disease, unspecified: Secondary | ICD-10-CM | POA: Diagnosis not present

## 2019-06-22 DIAGNOSIS — I773 Arterial fibromuscular dysplasia: Secondary | ICD-10-CM | POA: Diagnosis not present

## 2019-06-22 DIAGNOSIS — K219 Gastro-esophageal reflux disease without esophagitis: Secondary | ICD-10-CM

## 2019-06-22 DIAGNOSIS — R739 Hyperglycemia, unspecified: Secondary | ICD-10-CM

## 2019-06-22 MED ORDER — ROSUVASTATIN CALCIUM 5 MG PO TABS: ORAL_TABLET | ORAL | 1 refills | Status: DC

## 2019-06-22 NOTE — Patient Instructions (Signed)
Stop pravastatin.   Start crestor 5mg  - take one tablet Monday, Wednesday and Friday.

## 2019-06-22 NOTE — Progress Notes (Signed)
Patient ID: Victoria Hanson, female   DOB: 03/19/1951, 69 y.o.   MRN: [id number]   Virtual Visit via telephone Note  This visit type was conducted due to national recommendations for restrictions regarding the COVID-19 pandemic (e.g. social distancing).  This format is felt to be most appropriate for this patient at this time.  All issues noted in this document were discussed and addressed.  No physical exam was performed (except for noted visual exam findings with Video Visits).   I connected withLillian Hanson by a video enabled telemedicine application or telephone and verified that I am speaking with the correct person using two identifiers. Location patient: home Location provider: work  Persons participating in the telephonel visit: patient, provider  The limitations, risks, security and privacy concerns of performing an evaluation and management service by telephone and the availability of in person appointments have been discussed.   The patient expressed understanding and agreed to proceed.   Reason for visit: scheduled follow up  HPI: Recently evaluated by neurology - f/u Parkinsons.  On sinemet - adjusted dose.  Had problems with increased dose.  Feels she is doing better on her current regimen.  Taking celexa and uses alprazolam prn.  Feels this is controlling things well.  Trazodone helping sleep.  No chest pain.  Breathing doing better with using advair - scheduled.  Some hoarseness with advair.  Discussed rinsing mouth, etc.  No increased cough or congestion.  Does report increased acid reflux.  Taking prilosec daily.  Still some breakthrough.  No dysphagia.  No nausea or vomiting.  Bowels stable.  Discussed changing cholesterol medication.  She feels pravastatin - increased dreams.  Agreeable to try crestor three days per week. Still having heel pain. Seeing Dr Victoria Hanson.  S/p injection.  Discussed f/u given persistent pain.     ROS: See pertinent positives and negatives per  HPI.  Past Medical History:  Diagnosis Date  . Anxiety   . Asthma   . Atypical chest pain    a. 2003 Cath: reportedly nl per pt Victoria Hanson);  b. 04/2015 [facility] Admission, neg troponin.  . Colon cancer ([place]) 1999   a. 1999: Pt had partial colectomy. Did develop lung metastasis. Had right upper lobectomy in 01/2007 then had associated chemotherapy.  Marland Kitchen COPD (chronic obstructive pulmonary disease) ([place])   . DDD (degenerative disc disease), lumbosacral   . Depression   . Echocardiogram abnormal    a. 02/2010 Echo: EF 55-60%, mild LAE, no regional wall motion abnormalities  . Elevated transaminase level    a. ? NASH;  b. 06/2015 - nl LFTs.  . GERD (gastroesophageal reflux disease)   . Hematuria   . Holter monitor, abnormal    a. 12/2009 Holter monitoring: NSR, rare PACs and PVCs.  Marland Kitchen Hypercholesterolemia   . Hyperlipidemia   . Hypertension   . Leaky heart valve    a. Per pt report - no evidence of valvular abnormalities on prior echoes.  . Lesion of skin of Right breast 10/20/2017  . Lung metastases ([place]) 2008   a. S/P resection (lobectomy - right)  . Morbid obesity ([place])   . Nephrolithiasis   . Parkinson disease ([place]) 08/2016   Victoria Hanson    Past Surgical History:  Procedure Laterality Date  . BREAST EXCISIONAL BIOPSY Right 10/07/2009   benign  . CARDIAC CATHETERIZATION  2003   negative as per pt report  . CHOLECYSTECTOMY  2000  . COLON SURGERY     Colon cancer, removed part of  large colon  . COLONOSCOPY N/A 04/25/2015   Procedure: COLONOSCOPY;  Surgeon: Victoria Silvas, MD;  Location: [facility] ENDOSCOPY;  Service: Endoscopy;  Laterality: N/A;  . COLONOSCOPY WITH PROPOFOL N/A 06/14/2017   Procedure: COLONOSCOPY WITH PROPOFOL;  Surgeon: Victoria Silvas, MD;  Location: [facility] ENDOSCOPY;  Service: Endoscopy;  Laterality: N/A;  . LOBECTOMY  01/2007   Right lung, upper lobe right side removed  . TOTAL ABDOMINAL HYSTERECTOMY     abnormal bleeding  . TUBAL LIGATION  1980    Family History   Problem Relation Age of Onset  . Coronary artery disease Mother        died @ 64 of MI.  . Mental illness Mother   . Diabetes Mother   . Heart attack Father 18       MI  . Cancer Father        lung - died in his mid-70's  . Hypertension Father   . Diabetes Father   . Sudden death Brother   . Mental illness Brother   . Heart attack Brother        MI @ 63, died @ age 21.  . Breast cancer Neg Hx     SOCIAL HX: reviewed.    Current Outpatient Medications:  .  ALPRAZolam (XANAX) 0.5 MG tablet, 1/2 tablet q day prn, Disp: 30 tablet, Rfl: 0 .  aspirin EC 81 MG tablet, Take 81 mg by mouth as needed. , Disp: , Rfl:  .  carbidopa-levodopa (SINEMET) 25-100 MG tablet, Take 1.5 in am and one at 11:00 and one in afternoon and 1.5 at night., Disp: 150 tablet, Rfl: 0 .  citalopram (CELEXA) 10 MG tablet, TAKE 1 TABLET BY MOUTH DAILY, Disp: 90 tablet, Rfl: 1 .  ferrous sulfate 325 (65 FE) MG tablet, Take 325 mg by mouth daily with breakfast., Disp: , Rfl:  .  fluticasone (FLONASE) 50 MCG/ACT nasal spray, Place 2 sprays into both nostrils daily., Disp: 16 g, Rfl: 6 .  fluticasone-salmeterol (ADVAIR HFA) 115-21 MCG/ACT inhaler, Inhale 2 puffs into the lungs 2 (two) times daily., Disp: 1 Inhaler, Rfl: 4 .  hydrochlorothiazide (HYDRODIURIL) 12.5 MG tablet, TAKE 1 TABLET BY MOUTH DAILY, Disp: 90 tablet, Rfl: 1 .  Multiple Vitamin (MULTIVITAMIN) tablet, Take 1 tablet by mouth daily., Disp: , Rfl:  .  omeprazole (PRILOSEC) 20 MG capsule, Take 1 capsule (20 mg total) by mouth 2 (two) times daily before a meal., Disp: 180 capsule, Rfl: 1 .  potassium chloride SA (K-DUR) 20 MEQ tablet, TAKE 1 TABLET BY MOUTH TWICE A DAY, Disp: 180 tablet, Rfl: 1 .  PROAIR HFA 108 (90 Base) MCG/ACT inhaler, INHALE 2 PUFFS BY MOUTH INTO THE LUNGS EVERY 6 HOURS AS NEEDED FOR WHEEZING, Disp: 8.5 g, Rfl: 2 .  rosuvastatin (CRESTOR) 5 MG tablet, Take one tablet q Monday, Wednesday and Friday., Disp: 13 tablet, Rfl: 1 .  traZODone  (DESYREL) 50 MG tablet, TAKE 2 TABLETS BY MOUTH AT BEDTIME, Disp: 180 tablet, Rfl: 1 .  vitamin B-12 (CYANOCOBALAMIN) 100 MCG tablet, Take 100 mcg by mouth daily., Disp: , Rfl:   EXAM:  GENERAL: alert.  Sounds to be in no acute distress.  Answering questions appropriately.    PSYCH/NEURO: pleasant and cooperative, no obvious depression or anxiety, speech and thought processing grossly intact  ASSESSMENT AND PLAN:  Discussed the following assessment and plan:  Anxiety, mild On citalopram.  Takes xanax prn.  Doing better.  Follow.    Chronic obstructive  pulmonary disease ([place]) Plans to f/u with Dr Patsey Berthold.  Using advair scheduled.  Breathing better.  Follow.    CKD (chronic kidney disease), stage III Avoid antiinflammatories.  Stay hydrated.  Follow metabolic panel.   Depression, recurrent ([place]) Doing well on citalopram.  Trazodone helping sleep.  Follow.    Fibromuscular dysplasia ([place]) Saw [facility].  Recommended f/u prn.  Continue risk factor modification.    GERD (gastroesophageal reflux disease) Reflux as outlined.  Increase prilosec to bid.  Follow.    Hypercholesterolemia Describes pravastatin gives her vivid dreams.  Change to crestor.  Follow liver function tests.    Hyperglycemia Low carb diet and exercise.  Follow met b and a1c.   Hypertension Blood pressure under good control.  Continue same medication regimen.  Follow pressures.  Follow metabolic panel.    Parkinson's disease ([place]) Followed by neurology.  On sinemet. Doing well on current dose.     No orders of the defined types were placed in this encounter.   Meds ordered this encounter  Medications  . rosuvastatin (CRESTOR) 5 MG tablet    Sig: Take one tablet q Monday, Wednesday and Friday.    Dispense:  13 tablet    Refill:  1  . carbidopa-levodopa (SINEMET) 25-100 MG tablet    Sig: Take 1.5 in am and one at 11:00 and one in afternoon and 1.5 at night.    Dispense:  150 tablet    Refill:  0  .  omeprazole (PRILOSEC) 20 MG capsule    Sig: Take 1 capsule (20 mg total) by mouth 2 (two) times daily before a meal.    Dispense:  180 capsule    Refill:  1     I discussed the assessment and treatment plan with the patient. The patient was provided an opportunity to ask questions and all were answered. The patient agreed with the plan and demonstrated an understanding of the instructions.   The patient was advised to call back or seek an in-person evaluation if the symptoms worsen or if the condition fails to improve as anticipated.  I provided 25 minutes of non-face-to-face time during this encounter.   Einar Pheasant, MD

## 2019-06-23 ENCOUNTER — Telehealth: Payer: Self-pay | Admitting: Internal Medicine

## 2019-06-23 ENCOUNTER — Encounter: Payer: Self-pay | Admitting: Internal Medicine

## 2019-06-23 MED ORDER — CARBIDOPA-LEVODOPA 25-100 MG PO TABS: ORAL_TABLET | ORAL | 0 refills | Status: DC

## 2019-06-23 MED ORDER — OMEPRAZOLE 20 MG PO CPDR: 20.0000 mg | DELAYED_RELEASE_CAPSULE | Freq: Two times a day (BID) | ORAL | 1 refills | Status: DC

## 2019-06-23 NOTE — Assessment & Plan Note (Signed)
Saw [facility]VS.  Recommended f/u prn.  Continue risk factor modification.

## 2019-06-23 NOTE — Assessment & Plan Note (Signed)
Followed by neurology.  On sinemet. Doing well on current dose.

## 2019-06-23 NOTE — Assessment & Plan Note (Signed)
On citalopram.  Takes xanax prn.  Doing better.  Follow.

## 2019-06-23 NOTE — Assessment & Plan Note (Signed)
Reflux as outlined.  Increase prilosec to bid.  Follow.

## 2019-06-23 NOTE — Assessment & Plan Note (Signed)
Low carb diet and exercise.  Follow met b and a1c.  

## 2019-06-23 NOTE — Assessment & Plan Note (Signed)
Doing well on citalopram.  Trazodone helping sleep.  Follow.

## 2019-06-23 NOTE — Assessment & Plan Note (Signed)
Describes pravastatin gives her vivid dreams.  Change to crestor.  Follow liver function tests.

## 2019-06-23 NOTE — Assessment & Plan Note (Signed)
Plans to f/u with Dr Patsey Berthold.  Using advair scheduled.  Breathing better.  Follow.

## 2019-06-23 NOTE — Assessment & Plan Note (Signed)
Blood pressure under good control.  Continue same medication regimen.  Follow pressures.  Follow metabolic panel.   

## 2019-06-23 NOTE — Telephone Encounter (Signed)
Lm for pt to call back and schedule fasting labs in 6wks and a follow up with PCP in 7wks

## 2019-06-23 NOTE — Assessment & Plan Note (Signed)
Avoid antiinflammatories.  Stay hydrated.  Follow metabolic panel.

## 2019-07-03 IMAGING — CT CT ABD-PELV W/O CM
2 of 4 series · 16 of 46 positions shown, 18 images · non-contrast
Comparison: 02/16/2015

CLINICAL DATA: Pt to ED c/o abd bloating that started yesterday,
has gone down today, states burning sensation in abd. Is being
treated for UTI and stopped taking bactrim yesterday thinking that
was the cause.

EXAM:
CT ABDOMEN AND PELVIS WITHOUT CONTRAST
TECHNIQUE: Multidetector CT imaging of the abdomen and pelvis was performed
following the standard protocol without IV contrast.

[Series 2: routine abd/pel wo · axial · 0.75mm/px · z∈[-950,-540]mm · 13 of 92 slices shown, 15 images]
[im 5/92  soft-tissue]
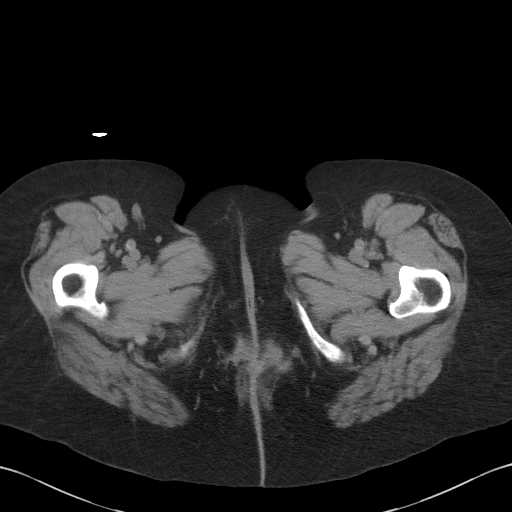
[im 5/92  bone]
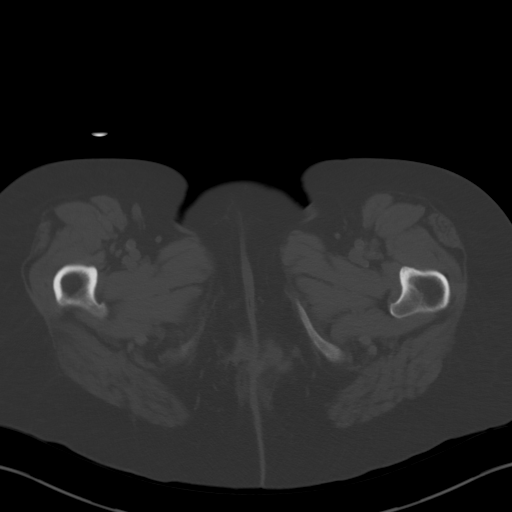
[im 13/92  soft-tissue]
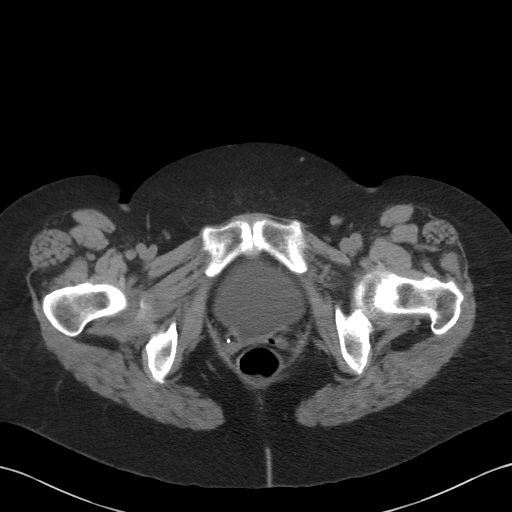
[im 21/92  soft-tissue]
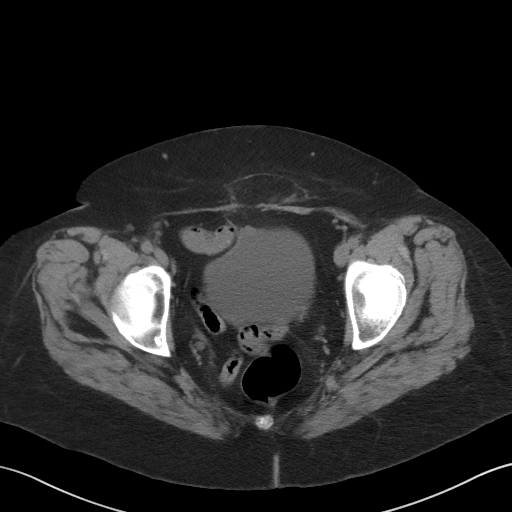
[im 25/92  soft-tissue]
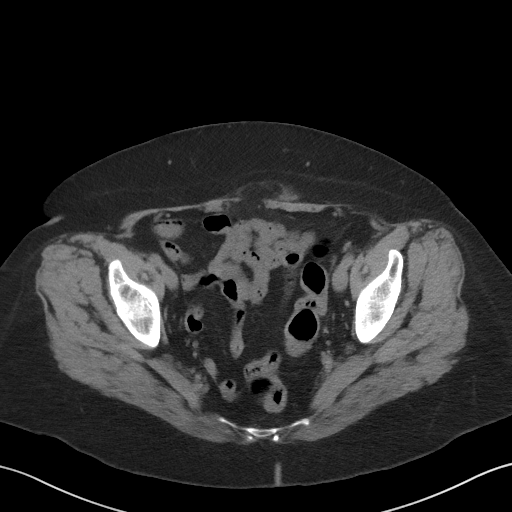
[im 34/92  soft-tissue]
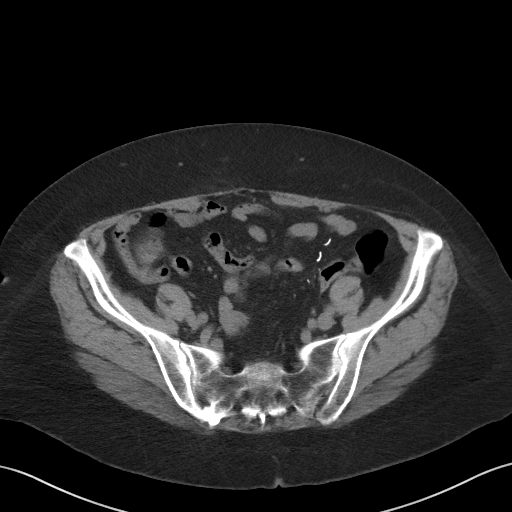
[im 38/92  soft-tissue]
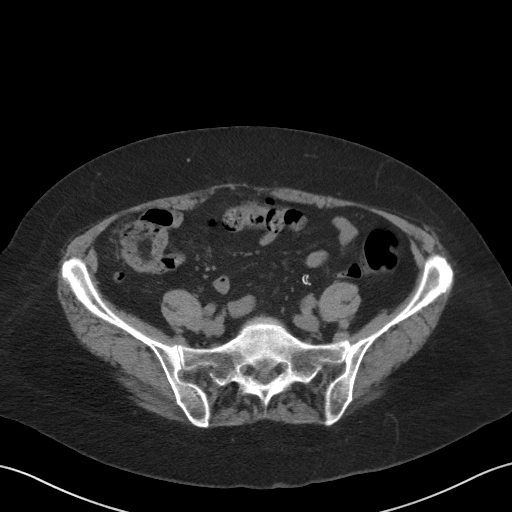
[im 46/92  soft-tissue]
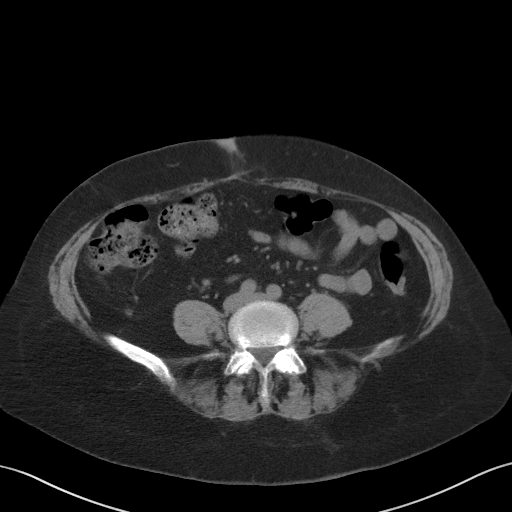
[im 54/92  soft-tissue]
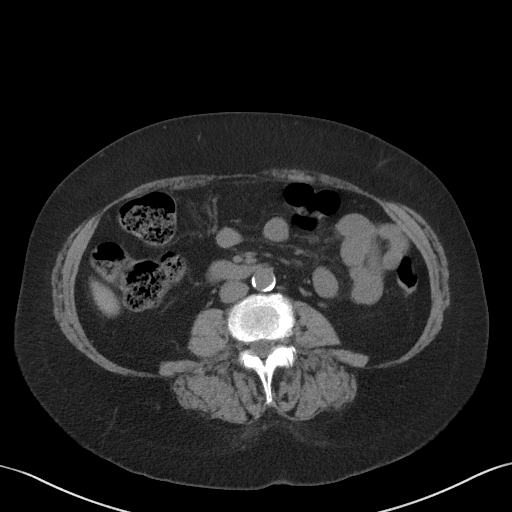
[im 58/92  soft-tissue]
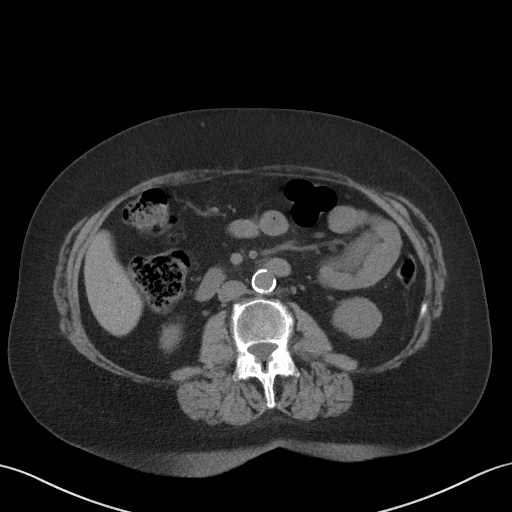
[im 58/92  bone]
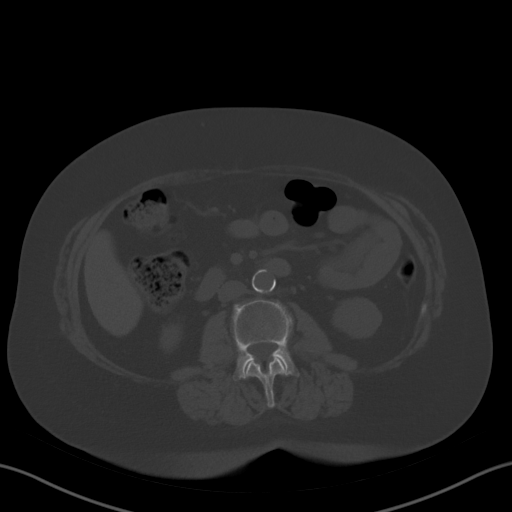
[im 67/92  soft-tissue]
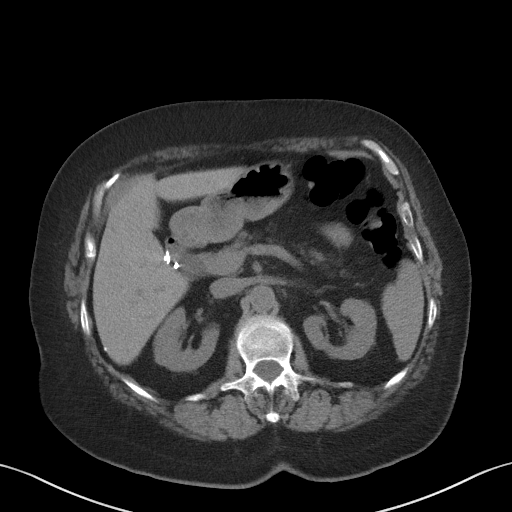
[im 71/92  soft-tissue]
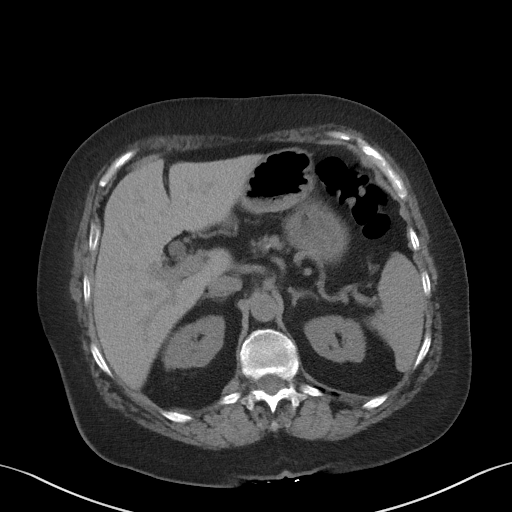
[im 79/92  soft-tissue]
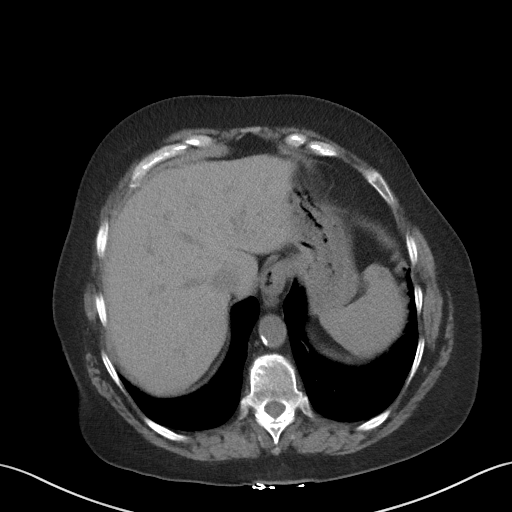
[im 87/92  soft-tissue]
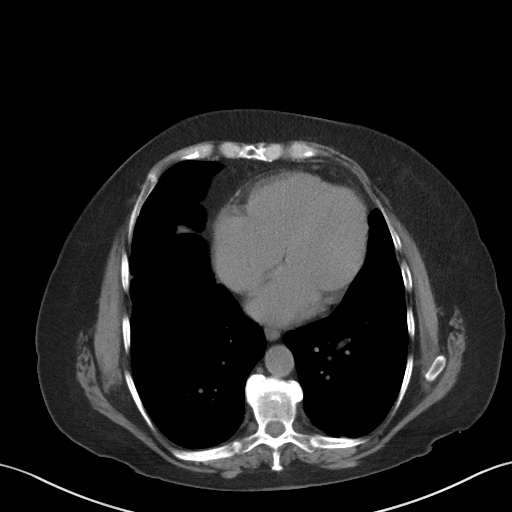

[Series 5: coronal st · coronal · 0.79mm/px · 3 of 86 slices shown]
[im 29/86  soft-tissue]
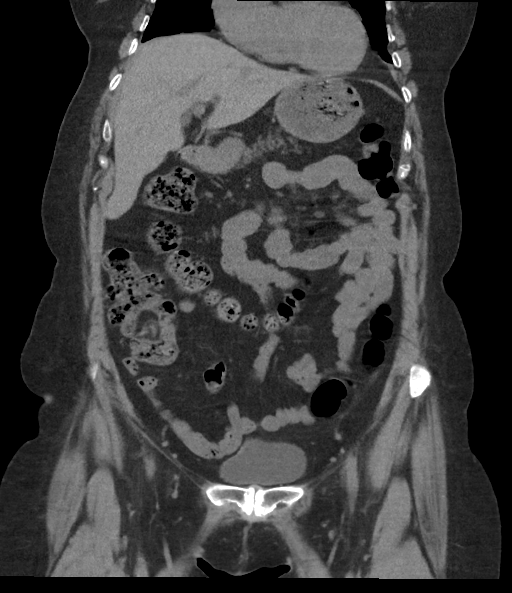
[im 38/86  soft-tissue]
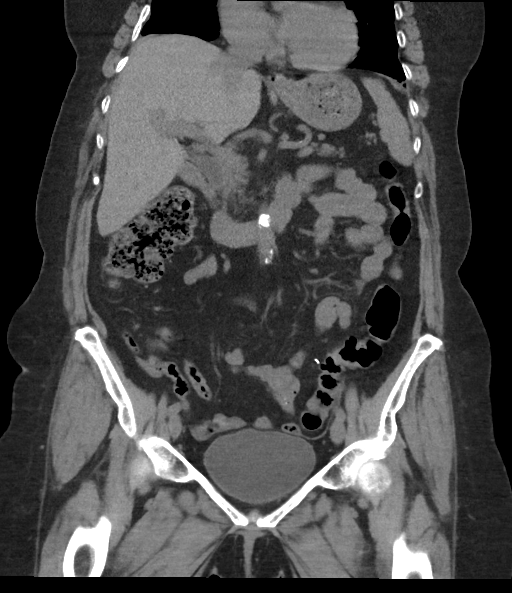
[im 48/86  soft-tissue]
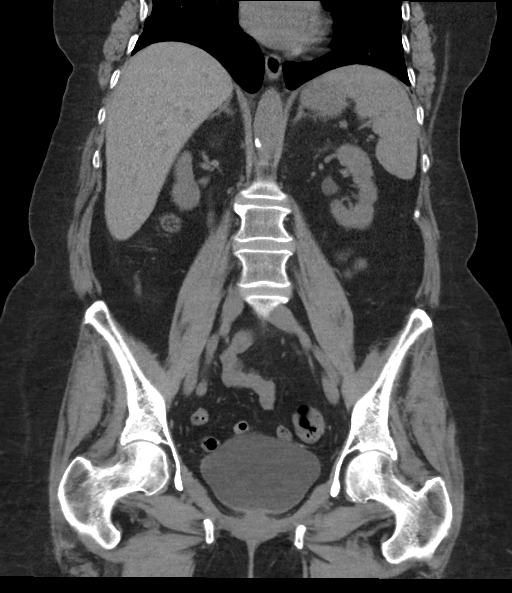

[16 of 46 positions shown; findings below may reference images not displayed]

FINDINGS: Lower chest: No acute abnormality.

Hepatobiliary: Cholecystectomy.  The liver is homogeneous.

Pancreas: Unremarkable. No pancreatic ductal dilatation or
surrounding inflammatory changes.

Spleen: Normal in size without focal abnormality.

Adrenals/Urinary Tract: Adrenal glands are normal in appearance.
Kidneys are symmetric in size. No hydronephrosis or urinary tract
obstruction. No nephrolithiasis.

Stomach/Bowel: The stomach and small bowel loops are normal in
appearance. There are scattered colonic diverticula. No acute
diverticulitis. The appendix is well seen and has a normal
appearance.

Vascular/Lymphatic: There is dense atherosclerotic calcification of
the abdominal aorta not associated with aneurysm.

Reproductive: Hysterectomy.  No adnexal mass.

Other: No free pelvic fluid. Anterior abdominal wall fat containing
hernia is identified in the suprapubic region. A second small fat
containing hernia is identified in the paraumbilical region. A small
complex fat containing hernia is identified in the infraumbilical
region slightly to the RIGHT of midline.

Musculoskeletal: No acute or significant osseous findings.
IMPRESSION: 1. No nephrolithiasis or urinary tract obstruction.
2. Cholecystectomy, hysterectomy.
3. Normal appendix.
4. Colonic diverticulosis without acute diverticulitis.
5.  Aortic atherosclerosis.  (AC1WM-LK2.2)
6. Stable, multiple small fat containing hernias without associated
obstruction.

## 2019-07-22 ENCOUNTER — Telehealth: Payer: Self-pay | Admitting: Internal Medicine

## 2019-07-22 NOTE — Telephone Encounter (Signed)
Pt needs a new referral for Pulmonary to [facility]

## 2019-07-29 NOTE — Telephone Encounter (Signed)
Pt was former pt of Dr Alva Garnet. He is no longer with  Pulmonary. Advised that she could still be followed by another Dominique Calvey in their office. Pt agreed and was given their number to schedule

## 2019-07-31 ENCOUNTER — Other Ambulatory Visit: Payer: Self-pay | Admitting: Internal Medicine

## 2019-07-31 NOTE — Telephone Encounter (Signed)
rx ok'd for alprazolam #30 with no refills.

## 2019-08-13 ENCOUNTER — Encounter: Payer: Self-pay | Admitting: Internal Medicine

## 2019-08-13 ENCOUNTER — Ambulatory Visit (INDEPENDENT_AMBULATORY_CARE_PROVIDER_SITE_OTHER): Payer: Medicare Other | Admitting: Internal Medicine

## 2019-08-13 ENCOUNTER — Other Ambulatory Visit: Payer: Self-pay

## 2019-08-13 VITALS — BP 108/60 | Ht 64.0 in | Wt 215.0 lb

## 2019-08-13 DIAGNOSIS — N183 Chronic kidney disease, stage 3 unspecified: Secondary | ICD-10-CM

## 2019-08-13 DIAGNOSIS — J449 Chronic obstructive pulmonary disease, unspecified: Secondary | ICD-10-CM | POA: Diagnosis not present

## 2019-08-13 DIAGNOSIS — Z85118 Personal history of other malignant neoplasm of bronchus and lung: Secondary | ICD-10-CM

## 2019-08-13 DIAGNOSIS — D649 Anemia, unspecified: Secondary | ICD-10-CM

## 2019-08-13 DIAGNOSIS — K219 Gastro-esophageal reflux disease without esophagitis: Secondary | ICD-10-CM | POA: Diagnosis not present

## 2019-08-13 DIAGNOSIS — E7800 Pure hypercholesterolemia, unspecified: Secondary | ICD-10-CM

## 2019-08-13 DIAGNOSIS — I773 Arterial fibromuscular dysplasia: Secondary | ICD-10-CM

## 2019-08-13 DIAGNOSIS — R739 Hyperglycemia, unspecified: Secondary | ICD-10-CM

## 2019-08-13 DIAGNOSIS — R0602 Shortness of breath: Secondary | ICD-10-CM

## 2019-08-13 DIAGNOSIS — F339 Major depressive disorder, recurrent, unspecified: Secondary | ICD-10-CM

## 2019-08-13 DIAGNOSIS — F419 Anxiety disorder, unspecified: Secondary | ICD-10-CM | POA: Diagnosis not present

## 2019-08-13 DIAGNOSIS — Z8589 Personal history of malignant neoplasm of other organs and systems: Secondary | ICD-10-CM

## 2019-08-13 DIAGNOSIS — G20 Parkinson's disease: Secondary | ICD-10-CM

## 2019-08-13 DIAGNOSIS — G20A1 Parkinson's disease without dyskinesia, without mention of fluctuations: Secondary | ICD-10-CM

## 2019-08-13 DIAGNOSIS — I10 Essential (primary) hypertension: Secondary | ICD-10-CM

## 2019-08-13 NOTE — Progress Notes (Signed)
Patient ID: Victoria Hanson, female   DOB: [birth year], 69 y.o.   MRN: [id number]   Virtual Visit via telephone Note  This visit type was conducted due to national recommendations for restrictions regarding the COVID-19 pandemic (e.g. social distancing).  This format is felt to be most appropriate for this patient at this time.  All issues noted in this document were discussed and addressed.  No physical exam was performed (except for noted visual exam findings with Video Visits).   I connected with  by telephone and verified that I am speaking with the correct person using two identifiers. Location patient: home Location provider: work  Persons participating in the virtual visit: patient, provider  The limitations, risks, security and privacy concerns of performing an evaluation and management service by telephone and the availability of in person appointments have been discussed. . The patient expressed understanding and agreed to proceed.   Reason for visit: scheduled follow up  HPI: She has noticed recently some sinus congestion/nasal congestion.  No documented fever.  States sinus symptoms have been present for the last 2-3 weeks. Also reports sob with exertion.  The sob with exertion is not new.  Has seen cardiology at [facility] previously. They felt her sob was related to underlying copd and lobectomy.  Note reviewed. Had ECHO 10/2018 - normal LV function, mild LVH, mild MR, TR, PR with EF 55-60%.  Stress test revealed borderline inferiro hypoperfusion with both stress and rest.  Subsequently had cardiac CT - coronary calcium score of 0.  No evidence of CAD.  She is using advair.  Discussed the need to use the advair bid.  Denies any known covid exposure.  Discussed the need to be checked given increased congestion.  Blood pressure has been low.  States - 108/60.  Discussed holding hctz.  No acid reflux.  No abdominal pain.  Bowels moving.  Handling stress.    ROS: See pertinent  positives and negatives per HPI.  Past Medical History:  Diagnosis Date  . Anxiety   . Asthma   . Atypical chest pain    a. 2003 Cath: reportedly nl per pt [name]);  b. 04/2015 [facility] Admission, neg troponin.  . Colon cancer ([place]) 1999   a. 1999: Pt had partial colectomy. Did develop lung metastasis. Had right upper lobectomy in 01/2007 then had associated chemotherapy.  Marland Kitchen COPD (chronic obstructive pulmonary disease) ([place])   . DDD (degenerative disc disease), lumbosacral   . Depression   . Echocardiogram abnormal    a. 02/2010 Echo: EF 55-60%, mild LAE, no regional wall motion abnormalities  . Elevated transaminase level    a. ? NASH;  b. 06/2015 - nl LFTs.  . GERD (gastroesophageal reflux disease)   . Hematuria   . Holter monitor, abnormal    a. 12/2009 Holter monitoring: NSR, rare PACs and PVCs.  Marland Kitchen Hypercholesterolemia   . Hyperlipidemia   . Hypertension   . Leaky heart valve    a. Per pt report - no evidence of valvular abnormalities on prior echoes.  . Lesion of skin of Right breast 10/20/2017  . Lung metastases ([place]) 2008   a. S/P resection (lobectomy - right)  . Morbid obesity ([place])   . Nephrolithiasis   . Parkinson disease ([place]) 08/2016   [name]    Past Surgical History:  Procedure Laterality Date  . BREAST EXCISIONAL BIOPSY Right 10/07/2009   benign  . CARDIAC CATHETERIZATION  2003   negative as per pt report  . CHOLECYSTECTOMY  2000  . COLON SURGERY     Colon cancer, removed part of large colon  . COLONOSCOPY N/A 04/25/2015   Procedure: COLONOSCOPY;  Surgeon: Manya Silvas, MD;  Location[facility] ENDOSCOPY;  Service: Endoscopy;  Laterality: N/A;  . COLONOSCOPY WITH PROPOFOL N/A 06/14/2017   Procedure: COLONOSCOPY WITH PROPOFOL;  Surgeon: Manya Silvas, MD;  Location: [facility] ENDOSCOPY;  Service: Endoscopy;  Laterality: N/A;  . LOBECTOMY  01/2007   Right lung, upper lobe right side removed  . TOTAL ABDOMINAL HYSTERECTOMY     abnormal bleeding  . TUBAL LIGATION   1980    Family History  Problem Relation Age of Onset  . Coronary artery disease Mother        died @ 74 of MI.  . Mental illness Mother   . Diabetes Mother   . Heart attack Father 24       MI  . Cancer Father        lung - died in his mid-70's  . Hypertension Father   . Diabetes Father   . Sudden death Brother   . Mental illness Brother   . Heart attack Brother        MI @ 72, died @ age 36.  . Breast cancer Neg Hx     SOCIAL HX: reviewed.    Current Outpatient Medications:  .  ALPRAZolam (XANAX) 0.5 MG tablet, TAKE 1/2 TABLET BY MOUTH EVERY DAY AS NEEDED, Disp: 30 tablet, Rfl: 0 .  aspirin EC 81 MG tablet, Take 81 mg by mouth as needed. , Disp: , Rfl:  .  carbidopa-levodopa (SINEMET) 25-100 MG tablet, Take 1.5 in am and one at 11:00 and one in afternoon and 1.5 at night., Disp: 150 tablet, Rfl: 0 .  citalopram (CELEXA) 10 MG tablet, TAKE 1 TABLET BY MOUTH DAILY (Patient taking differently: Taking 1 and half tablet daily.), Disp: 90 tablet, Rfl: 1 .  ferrous sulfate 325 (65 FE) MG tablet, Take 325 mg by mouth daily with breakfast., Disp: , Rfl:  .  fluticasone (FLONASE) 50 MCG/ACT nasal spray, Place 2 sprays into both nostrils daily., Disp: 16 g, Rfl: 6 .  fluticasone-salmeterol (ADVAIR HFA) 115-21 MCG/ACT inhaler, Inhale 2 puffs into the lungs 2 (two) times daily., Disp: 1 Inhaler, Rfl: 4 .  hydrochlorothiazide (HYDRODIURIL) 12.5 MG tablet, TAKE 1 TABLET BY MOUTH DAILY, Disp: 90 tablet, Rfl: 1 .  Multiple Vitamin (MULTIVITAMIN) tablet, Take 1 tablet by mouth daily., Disp: , Rfl:  .  omeprazole (PRILOSEC) 20 MG capsule, Take 1 capsule (20 mg total) by mouth 2 (two) times daily before a meal., Disp: 180 capsule, Rfl: 1 .  potassium chloride SA (K-DUR) 20 MEQ tablet, TAKE 1 TABLET BY MOUTH TWICE A DAY, Disp: 180 tablet, Rfl: 1 .  PROAIR HFA 108 (90 Base) MCG/ACT inhaler, INHALE 2 PUFFS BY MOUTH INTO THE LUNGS EVERY 6 HOURS AS NEEDED FOR WHEEZING, Disp: 8.5 g, Rfl: 2 .   rosuvastatin (CRESTOR) 5 MG tablet, Take one tablet q Monday, Wednesday and Friday., Disp: 13 tablet, Rfl: 1 .  traZODone (DESYREL) 50 MG tablet, TAKE 2 TABLETS BY MOUTH AT BEDTIME, Disp: 180 tablet, Rfl: 1 .  vitamin B-12 (CYANOCOBALAMIN) 100 MCG tablet, Take 100 mcg by mouth daily., Disp: , Rfl:   EXAM:  VITALS per patient if applicable: 287/86  GENERAL: alert.  Sounds to be in no acute distress.  Answering questions appropriately.   PSYCH/NEURO: pleasant and cooperative, no obvious depression or anxiety, speech and thought  processing grossly intact  ASSESSMENT AND PLAN:  Discussed the following assessment and plan:  Acid reflux No acid reflux symptoms reported.  On omeprazole.    Anxiety, mild On citalopram.  Appears to be doing better.  Has xanax to take prn.    Chronic obstructive pulmonary disease [facility]) Previously saw Dr Alva Garnet.  Has had some chronic sob with exertion.  Diagnosed with COPD.  Using advair.  Discussed the importance of using bid.  Saline nasal spray and nasacort nasal spray - to help with current nasal congestion.  CXR 02/2019 - no active cardiopulmonary disease.  Stable scarring.  Will screen for covid.  Has f/u planned with Dr Patsey Berthold.    CKD (chronic kidney disease), stage III Avoid antiinflammatories.  Stay hydrated.  Blood pressure running low.  Will hold hctz.  Follow metabolic panel.   Depression, recurrent ([place]) On citalopram.  Also has trazodone to help with sleep.  Appears to be doing better.  Follow.    Fibromuscular dysplasia ([facility]) S/p [facility].  Continue risk factor modification.  Recommended f/u prn.   History of malignant neoplasm metastatic to lung S/p lobectomy.  Seeing pulmonary.    Hypercholesterolemia On crestor.  Low cholesterol diet and exercise.  Follow lipid panel and liver function tests.    Hyperglycemia Low carb diet and exercise.  Follow met b and a1c.   Hypertension Blood pressure running lower as outlined.  Hold hctz.  Follow  pressures.    Parkinson's disease ([place]) On sinemet.  Followed by neurology.    SOB (shortness of breath) Has had sob with exertion.  Reports persistent sob with exertion.  Treat current sinus congestion/nasal congestoin as outlined.  Has been [facility] by [facility]ology.  Has had ECHO and stress test - as outlined in note.  Last check, cardiology felt sob was related to her COPD and history of lobectomy.  Has been seeing Dr Alva Garnet.  On advair.  Discussed importance of using bid.  cxr 02/2019 as outlined.  Has f/u planned with Dr Patsey Berthold 09/02/19.  She request to f/u with cardiology in [place].  Screen for covid.     Orders Placed This Encounter  Procedures  . CBC with Differential/Platelet    Standing Status:   Future    Standing Expiration Date:   08/19/2020  . Ferritin    Standing Status:   Future    Standing Expiration Date:   08/19/2020  . Hemoglobin A1c    Standing Status:   Future    Standing Expiration Date:   08/19/2020  . Hepatic function panel    Standing Status:   Future    Standing Expiration Date:   08/19/2020  . Lipid panel    Standing Status:   Future    Standing Expiration Date:   08/19/2020  . Basic metabolic panel    Standing Status:   Future    Standing Expiration Date:   08/19/2020  . Ambulatory referral to Cardiology    Referral Priority:   Routine    Referral Type:   Consultation    Referral Reason:   Specialty Services Required    Requested Specialty:   Cardiology    Number of Visits Requested:   1     I discussed the assessment and treatment plan with the patient. The patient was provided an opportunity to ask questions and all were answered. The patient agreed with the plan and demonstrated an understanding of the instructions.   The patient was advised to call back or seek  an in-person evaluation if the symptoms worsen or if the condition fails to improve as anticipated.  I provided 25 minutes of non-face-to-face time during this encounter.   Einar Pheasant, MD

## 2019-08-14 ENCOUNTER — Ambulatory Visit: Payer: Medicare Other | Attending: Internal Medicine

## 2019-08-14 DIAGNOSIS — Z20822 Contact with and (suspected) exposure to covid-19: Secondary | ICD-10-CM

## 2019-08-15 LAB — NOVEL CORONAVIRUS, NAA: SARS-CoV-2, NAA: NOT DETECTED

## 2019-08-17 ENCOUNTER — Telehealth: Payer: Self-pay | Admitting: Internal Medicine

## 2019-08-17 NOTE — Telephone Encounter (Signed)
Pt would like the results of her Covid test

## 2019-08-19 ENCOUNTER — Other Ambulatory Visit: Payer: Self-pay | Admitting: Internal Medicine

## 2019-08-19 NOTE — Telephone Encounter (Signed)
Patient advised covid test was negative. Patient stated that she was needing bloodwork and a follow up appt but was waiting on covid results. Doing ok and said that cardiology referral was discussed at last appt.

## 2019-08-20 ENCOUNTER — Telehealth: Payer: Self-pay | Admitting: Internal Medicine

## 2019-08-20 ENCOUNTER — Encounter: Payer: Self-pay | Admitting: Internal Medicine

## 2019-08-20 NOTE — Telephone Encounter (Signed)
Order placed for cardiology referral.  Needs appt - 6-8 weeks.

## 2019-08-20 NOTE — Assessment & Plan Note (Signed)
On citalopram.  Appears to be doing better.  Has xanax to take prn.

## 2019-08-20 NOTE — Assessment & Plan Note (Signed)
Blood pressure running lower as outlined.  Hold hctz.  Follow pressures.

## 2019-08-20 NOTE — Telephone Encounter (Signed)
She did stop hctz after last visit. She had an old rx of furosemide from July so she took one yesterday and today. Has not taken any hctz since advised to stop. Advised her to hold hctz and lasix. Swelling better in the am. Advised to elevate legs avoid salt intake.

## 2019-08-20 NOTE — Assessment & Plan Note (Signed)
Avoid antiinflammatories.  Stay hydrated.  Blood pressure running low.  Will hold hctz.  Follow metabolic panel.

## 2019-08-20 NOTE — Assessment & Plan Note (Signed)
On citalopram.  Also has trazodone to help with sleep.  Appears to be doing better.  Follow.

## 2019-08-20 NOTE — Telephone Encounter (Signed)
Pt called and said her feet and legs swollen. The left is worse than right. She started back taking lasiks? She wants to know if this ok?

## 2019-08-20 NOTE — Telephone Encounter (Signed)
Please schedule pt for 6-8 wk follow up with Dr Nicki Reaper

## 2019-08-20 NOTE — Telephone Encounter (Signed)
Pt called in and scheduled appt for 09/25/19. She wants in person visit with labs same day.

## 2019-08-20 NOTE — Assessment & Plan Note (Signed)
S/p lobectomy.  Seeing pulmonary.

## 2019-08-20 NOTE — Telephone Encounter (Signed)
This was discussed at her last visit. Cardiology referral placed. Are you ok with her taking lasix again?

## 2019-08-20 NOTE — Assessment & Plan Note (Signed)
No acid reflux symptoms reported.  On omeprazole.

## 2019-08-20 NOTE — Assessment & Plan Note (Signed)
On sinemet.  Followed by neurology.

## 2019-08-20 NOTE — Assessment & Plan Note (Signed)
On crestor.  Low cholesterol diet and exercise.  Follow lipid panel and liver function tests.   

## 2019-08-20 NOTE — Assessment & Plan Note (Signed)
Has had sob with exertion.  Reports persistent sob with exertion.  Treat current sinus congestion/nasal congestoin as outlined.  Has been followed by [facility] cardiology.  Has had ECHO and stress test - as outlined in note.  Last check, cardiology felt sob was related to her COPD and history of lobectomy.  Has been seeing Dr Alva Garnet.  On advair.  Discussed importance of using bid.  cxr 02/2019 as outlined.  Has f/u planned with Dr Patsey Berthold 09/02/19.  She request to f/u with cardiology in [place].  Screen for covid.

## 2019-08-20 NOTE — Assessment & Plan Note (Signed)
Low carb diet and exercise.  Follow met b and a1c.  

## 2019-08-20 NOTE — Telephone Encounter (Signed)
We discussed stopping her hctz not lasix.  I thought she was not taking lasix and was only on hctz.  Please confirm what she was taking.  Also, confirm if swelling better in am and worse in evening.  If so, elevate legs.  Avoid increased salt intake.

## 2019-08-20 NOTE — Assessment & Plan Note (Signed)
S/p AVVS.  Continue risk factor modification.  Recommended f/u prn.

## 2019-08-20 NOTE — Assessment & Plan Note (Signed)
Previously saw Dr Alva Garnet.  Has had some chronic sob with exertion.  Diagnosed with COPD.  Using advair.  Discussed the importance of using bid.  Saline nasal spray and nasacort nasal spray - to help with current nasal congestion.  CXR 02/2019 - no active cardiopulmonary disease.  Stable scarring.  Will screen for covid.  Has f/u planned with Dr Patsey Berthold.

## 2019-08-21 NOTE — Telephone Encounter (Signed)
Left detailed message for patient.

## 2019-08-21 NOTE — Telephone Encounter (Signed)
Per last note, blood pressure was running low.  We held hctz.  Have her monitor her blood pressures.  Elevate legs and decrease salt intake as outlined previously.  Can send in blood pressure readings.  For now, remain of hctz and lasix.

## 2019-08-25 ENCOUNTER — Ambulatory Visit: Payer: Medicare Other | Admitting: Podiatry

## 2019-08-28 ENCOUNTER — Other Ambulatory Visit: Payer: Self-pay | Admitting: Internal Medicine

## 2019-08-28 ENCOUNTER — Telehealth: Payer: Self-pay | Admitting: Internal Medicine

## 2019-08-28 ENCOUNTER — Other Ambulatory Visit: Payer: Self-pay

## 2019-08-28 NOTE — Telephone Encounter (Signed)
Patient is out of her rosuvastatin (CRESTOR) 5 MG tablet

## 2019-08-28 NOTE — Telephone Encounter (Signed)
Medication has been sent in 

## 2019-08-28 NOTE — Patient Outreach (Signed)
[facility]) Care Management  08/28/2019  Victoria Hanson [birth year] [id number]   Medication Adherence call to Victoria Hanson [facility] spoke with patient she is past due on Rosuvastatin 5 mg,patient explain she is taking 1 tablet 3 times a week,she explain she took the last tablet today,patient wants to order this medication her self for her next dose on Monday. Victoria Hanson is showing past due under [place].   [facility] Management Direct Dial [phone]  Fax [phone] [email or web address]

## 2019-09-01 ENCOUNTER — Other Ambulatory Visit: Payer: Self-pay

## 2019-09-01 ENCOUNTER — Ambulatory Visit: Payer: Medicare Other | Admitting: Podiatry

## 2019-09-01 ENCOUNTER — Ambulatory Visit (INDEPENDENT_AMBULATORY_CARE_PROVIDER_SITE_OTHER): Payer: Medicare Other | Admitting: Podiatry

## 2019-09-01 VITALS — Temp 98.1°F

## 2019-09-01 DIAGNOSIS — B351 Tinea unguium: Secondary | ICD-10-CM | POA: Diagnosis not present

## 2019-09-01 DIAGNOSIS — M7661 Achilles tendinitis, right leg: Secondary | ICD-10-CM

## 2019-09-01 DIAGNOSIS — M79676 Pain in unspecified toe(s): Secondary | ICD-10-CM | POA: Diagnosis not present

## 2019-09-02 ENCOUNTER — Telehealth: Payer: Self-pay | Admitting: Pulmonary Disease

## 2019-09-02 ENCOUNTER — Ambulatory Visit: Payer: Medicare Other | Admitting: Pulmonary Disease

## 2019-09-02 NOTE — Telephone Encounter (Signed)
I called and spoke with the patient and made her aware of the remaining refills on her Advair and Pro-air. I advised her that she should not need any refills before she returns to the office on 10/07/19. I advised her to call is she needs anything else.

## 2019-09-03 NOTE — Progress Notes (Signed)
HPI: 69 y.o. female presenting today for follow up evaluation of insertional Achilles tendinitis of the right lower extremity. She reports still having a painful nodule located to the Achilles area. She states the pain is intermittent and some days are worse than others. Driving and sitting increases the pain. She has taken the Medrol Dose Pak and Meloxicam for treatment as directed.   She also complains of elongated, thickened nails 1-5 bilaterally that cause pain with ambulation in shoes. She is unable to trim her own nails. Patient is here for further evaluation and treatment.    Past Medical History:  Diagnosis Date  . Anxiety   . Asthma   . Atypical chest pain    a. 2003 Cath: reportedly nl per pt [name]);  b. 04/2015 [place] Admission, neg troponin.  . Colon cancer ([place]) 1999   a. 1999: Pt had partial colectomy. Did develop lung metastasis. Had right upper lobectomy in 01/2007 then had associated chemotherapy.  Marland Kitchen COPD (chronic obstructive pulmonary disease) ([place])   . DDD (degenerative disc disease), lumbosacral   . Depression   . Echocardiogram abnormal    a. 02/2010 Echo: EF 55-60%, mild LAE, no regional wall motion abnormalities  . Elevated transaminase level    a. ? NASH;  b. 06/2015 - nl LFTs.  . GERD (gastroesophageal reflux disease)   . Hematuria   . Holter monitor, abnormal    a. 12/2009 Holter monitoring: NSR, rare PACs and PVCs.  Marland Kitchen Hypercholesterolemia   . Hyperlipidemia   . Hypertension   . Leaky heart valve    a. Per pt report - no evidence of valvular abnormalities on prior echoes.  . Lesion of skin of Right breast 10/20/2017  . Lung metastases ([place]) 2008   a. S/P resection (lobectomy - right)  . Morbid obesity ([place])   . Nephrolithiasis   . Parkinson disease ([place]) 08/2016   [name]      Physical Exam: General: The patient is alert and oriented x3 in no acute distress.  Dermatology: Nails are tender, long, thickened and dystrophic with subungual debris,  consistent with onychomycosis, 1-5 bilateral. No signs of infection noted. Skin is warm, dry and supple bilateral lower extremities. Negative for open lesions or macerations.  Vascular: Palpable pedal pulses bilaterally. No edema or erythema noted. Capillary refill within normal limits.  Neurological: Epicritic and protective threshold grossly intact bilaterally.   Musculoskeletal Exam: Pain on palpation noted to the posterior tubercle of the right calcaneus at the insertion of the Achilles tendon consistent with retrocalcaneal bursitis. Range of motion within normal limits. Muscle strength 5/5 in all muscle groups bilateral lower extremities.  Assessment: 1. Insertional Achilles tendinitis right  2. Onychodystrophic nails 1-5 bilateral with hyperkeratosis of nails.  3. Onychomycosis of nail due to dermatophyte bilateral    Plan of Care:  1. Patient was evaluated. 2. Mechanical debridement of nails 1-5 bilaterally performed using a nail nipper. Filed with dremel without incident.  3. Appointment with Pedorthist for custom molded orthotics.  4. Return to clinic as needed.   Sister is Stanton Kidney.     Edrick Kins, DPM [facility]  Dr. Edrick Kins, [place]                                        [place],  [address]  Office [phone]  Fax [phone]

## 2019-09-14 ENCOUNTER — Other Ambulatory Visit: Payer: Self-pay | Admitting: Internal Medicine

## 2019-09-14 NOTE — Progress Notes (Signed)
Cardiology Office Note   Date:  09/15/2019   ID:  Victoria Hanson, DOB 1950-09-09, MRN [id number]  PCP:  Victoria Pheasant, MD    No chief complaint on file.  Shortness of breath  Wt Readings from Last 3 Encounters:  09/15/19 214 lb 6.4 oz (97.3 kg)  08/13/19 215 lb (97.5 kg)  06/22/19 215 lb (97.5 kg)       History of Present Illness: Victoria Hanson is a 69 y.o. female  Who has seen Dr. Fletcher Hanson, and then changed to Victoria Hanson and is now seeing [place] again.   Jan 2017 echo showed: "Echo shows normal LV function. Pulm pressure is mildly elevated - likely 2/2 COPD (h/o second hand smoke exposure). "  No plaque on carotid Doppler in 2018  2018 Holter: "Normal sinus rhythm. Mild PVCs with a total of 178 beats representing less than 1% burden. Rare PACs. No significant arrhythmia overall. Average heart rate was 69 bpm."  Normal coronary CTA in June 2020.  Calcium score of 0.  PFTs in 2020 show: "No obvious evidence of Obstructive or Restrictive Lung disease"  She reports shortness of breath.  It comes on randomly.  Not necessarily related to exertion. It can come on with walking.  She had right upper lobe of lung removed for metastatic colon cancer.  She has some bilateral edema, she feels left leg is worse.  Better first in the morning.  Once she stands on it for an hour, it is back to being swollen.  Worse if she eats more salt.   Denies : Chest pain. Dizziness. Nitroglycerin use. Orthopnea. Palpitations. Paroxysmal nocturnal dyspnea.  Syncope.     Past Medical History:  Diagnosis Date  . Anxiety   . Asthma   . Atypical chest pain    a. 2003 Cath: reportedly nl per pt Victoria Hanson);  b. 04/2015 [place] Admission, neg troponin.  . Colon cancer ([name]) 1999   a. 1999: Pt had partial colectomy. Did develop lung metastasis. Had right upper lobectomy in 01/2007 then had associated chemotherapy.  Marland Kitchen COPD (chronic obstructive pulmonary disease) ([place])   . DDD (degenerative disc  disease), lumbosacral   . Depression   . Echocardiogram abnormal    a. 02/2010 Echo: EF 55-60%, mild LAE, no regional wall motion abnormalities  . Elevated transaminase level    a. ? NASH;  b. 06/2015 - nl LFTs.  . GERD (gastroesophageal reflux disease)   . Hematuria   . Holter monitor, abnormal    a. 12/2009 Holter monitoring: NSR, rare PACs and PVCs.  Marland Kitchen Hypercholesterolemia   . Hyperlipidemia   . Hypertension   . Leaky heart valve    a. Per pt report - no evidence of valvular abnormalities on prior echoes.  . Lesion of skin of Right breast 10/20/2017  . Lung metastases ([name]) 2008   a. S/P resection (lobectomy - right)  . Morbid obesity ([name])   . Nephrolithiasis   . Parkinson disease ([name]) 08/2016   [name]    Past Surgical History:  Procedure Laterality Date  . BREAST EXCISIONAL BIOPSY Right 10/07/2009   benign  . CARDIAC CATHETERIZATION  2003   negative as per pt report  . CHOLECYSTECTOMY  2000  . COLON SURGERY     Colon cancer, removed part of large colon  . COLONOSCOPY N/A 04/25/2015   Procedure: COLONOSCOPY;  Surgeon: Victoria Silvas, MD;  Location: [facility] ENDOSCOPY;  Service: Endoscopy;  Laterality: N/A;  . COLONOSCOPY WITH PROPOFOL N/A 06/14/2017  Procedure: COLONOSCOPY WITH PROPOFOL;  Surgeon: Victoria Silvas, MD;  Location[facility] ENDOSCOPY;  Service: Endoscopy;  Laterality: N/A;  . LOBECTOMY  01/2007   Right lung, upper lobe right side removed  . TOTAL ABDOMINAL HYSTERECTOMY     abnormal bleeding  . TUBAL LIGATION  1980     Current Outpatient Medications  Medication Sig Dispense Refill  . ALPRAZolam (XANAX) 0.5 MG tablet TAKE 1/2 TABLET BY MOUTH EVERY DAY AS NEEDED 30 tablet 0  . aspirin EC 81 MG tablet Take 81 mg by mouth as needed.     . carbidopa-levodopa (SINEMET) 25-100 MG tablet Take 1.5 in am and one at 11:00 and one in afternoon and 1.5 at night. 150 tablet 0  . citalopram (CELEXA) 10 MG tablet Taking 1 and half tablet daily. 90 tablet 1  . ferrous  sulfate 325 (65 FE) MG tablet Take 325 mg by mouth daily with breakfast.    . fluticasone (FLONASE) 50 MCG/ACT nasal spray Place 2 sprays into both nostrils daily. 16 g 6  . fluticasone-salmeterol (ADVAIR HFA) 115-21 MCG/ACT inhaler Inhale 2 puffs into the lungs 2 (two) times daily. 1 Inhaler 4  . Multiple Vitamin (MULTIVITAMIN) tablet Take 1 tablet by mouth daily.    Marland Kitchen omeprazole (PRILOSEC) 20 MG capsule Take 1 capsule (20 mg total) by mouth 2 (two) times daily before a meal. 180 capsule 1  . potassium chloride SA (K-DUR) 20 MEQ tablet TAKE 1 TABLET BY MOUTH TWICE A DAY 180 tablet 1  . PROAIR HFA 108 (90 Base) MCG/ACT inhaler INHALE 2 PUFFS BY MOUTH INTO THE LUNGS EVERY 6 HOURS AS NEEDED FOR WHEEZING 8.5 g 2  . rosuvastatin (CRESTOR) 5 MG tablet TAKE 1 TABLET BY MOUTH ON MONDAY, WEDNESDAY AND FRIDAY 13 tablet 1  . traZODone (DESYREL) 50 MG tablet TAKE 2 TABLETS BY MOUTH AT BEDTIME 180 tablet 1  . vitamin B-12 (CYANOCOBALAMIN) 100 MCG tablet Take 100 mcg by mouth daily.     No current facility-administered medications for this visit.    Allergies:   Macrobid [nitrofurantoin macrocrystal]; Antihistamines, diphenhydramine-type; Aspirin; Ceftin [cefuroxime axetil]; Celecoxib; Cymbalta [duloxetine hcl]; Diphenhydramine; Escitalopram; Lexapro [escitalopram oxalate]; Metronidazole; Nitrofurantoin; Zoloft [sertraline hcl]; and Ciprofloxacin    Social History:  The patient  reports that she is a non-smoker but has been exposed to tobacco smoke. She has never used smokeless tobacco. She reports current alcohol use of about 1.0 standard drinks of alcohol per week. She reports that she does not use drugs.   Family History:  The patient's family history includes Cancer in her father; Coronary artery disease in her mother; Diabetes in her father and mother; Heart attack in her brother; Heart attack (age of onset: 46) in her father; Hypertension in her father; Mental illness in her brother and mother; Sudden  death in her brother.    ROS:  Please see the history of present illness.   Otherwise, review of systems are positive for leg edema.   All other systems are reviewed and negative.    PHYSICAL EXAM: VS:  BP 118/80   Pulse 72   Ht 5\' 4"  (1.626 m)   Wt 214 lb 6.4 oz (97.3 kg)   SpO2 97%   BMI 36.80 kg/m  , BMI Body mass index is 36.8 kg/m. GEN: Well nourished, well developed, in no acute distress  HEENT: normal  Neck: no JVD, carotid bruits, or masses Cardiac: RRR; no murmurs, rubs, or gallops,; bilateral leg edema 1+ pitting- appears symmetric Respiratory:  clear to auscultation bilaterally, normal work of breathing GI: soft, nontender, nondistended, + BS MS: no deformity or atrophy  Skin: warm and dry, no rash Neuro:  Strength and sensation are intact Psych: euthymic mood, full affect   EKG:   The ekg ordered  In 02/2019 demonstrates NSR, diffuse ST changes   Recent Labs: 11/21/2018: TSH 2.05 02/13/2019: B Natriuretic Peptide 73.0; Magnesium 2.1 04/15/2019: ALT 32; BUN 20; Creatinine, Ser 1.25; Hemoglobin 12.1; Platelets 227.0; Potassium 4.6; Sodium 139   Lipid Panel    Component Value Date/Time   CHOL 245 (H) 04/15/2019 0821   TRIG 118.0 04/15/2019 0821   HDL 59.70 04/15/2019 0821   CHOLHDL 4 04/15/2019 0821   VLDL 23.6 04/15/2019 0821   LDLCALC 162 (H) 04/15/2019 0821   LDLDIRECT 163.4 12/31/2012 1022     Other studies Reviewed: Additional studies/ records that were reviewed today with results demonstrating: labs reviewed, LDL 162 in 04/2019.   ASSESSMENT AND PLAN:  1. SHOB: echo at [facility] in 2020 showed normal LVEF.  No signs of CHF.  I think this may be multifactorial from deconditioning , and some pulmonary issues per her report. No CAD by CTA. COntinue preventive therapy.  2. LE edema: COmpression stockings.  I believe this is from venous insufficiency rather than any specific cardiac issue.  If edema persists despite leg elevation and compression, consider  low dose diuretic.  3. Hyperlipidemia: Continue Crestor.  Would like to see LDL at least below 130. 4. Morbid obesity:  Spoke about the importance of weight loss.    Current medicines are reviewed at length with the patient today.  The patient concerns regarding her medicines were addressed.  The following changes have been made:  No change  Labs/ tests ordered today include:  No orders of the defined types were placed in this encounter.   Recommend 150 minutes/week of aerobic exercise Low fat, low carb, high fiber diet recommended  Disposition:   FU in 1 year   Signed, Larae Grooms, MD  09/15/2019 3:57 PM    [facility] [address], [place], [place]  [address] Phone: [phone]; Fax: [phone]

## 2019-09-15 ENCOUNTER — Other Ambulatory Visit: Payer: Self-pay

## 2019-09-15 ENCOUNTER — Ambulatory Visit: Payer: Medicare Other | Admitting: Interventional Cardiology

## 2019-09-15 ENCOUNTER — Encounter: Payer: Self-pay | Admitting: Interventional Cardiology

## 2019-09-15 VITALS — BP 118/80 | HR 72 | Ht 64.0 in | Wt 214.4 lb

## 2019-09-15 DIAGNOSIS — R600 Localized edema: Secondary | ICD-10-CM

## 2019-09-15 DIAGNOSIS — E782 Mixed hyperlipidemia: Secondary | ICD-10-CM

## 2019-09-15 DIAGNOSIS — R0602 Shortness of breath: Secondary | ICD-10-CM

## 2019-09-15 NOTE — Patient Instructions (Signed)
Medication Instructions:  Your physician recommends that you continue on your current medications as directed. Please refer to the Current Medication list given to you today.  *If you need a refill on your cardiac medications before your next appointment, please call your pharmacy*   Lab Work: None ordered  If you have labs (blood work) drawn today and your tests are completely normal, you will receive your results only by: Marland Kitchen MyChart Message (if you have MyChart) OR . A paper copy in the mail If you have any lab test that is abnormal or we need to change your treatment, we will call you to review the results.   Testing/Procedures: None ordered   Follow-Up: At [facility], you and your health needs are our priority.  As part of our continuing mission to provide you with exceptional heart care, we have created designated Provider Care Teams.  These Care Teams include your primary Cardiologist (physician) and Advanced Practice Providers (APPs -  Physician Assistants and Nurse Practitioners) who all work together to provide you with the care you need, when you need it.  We recommend signing up for the patient portal called "MyChart".  Sign up information is provided on this After Visit Summary.  MyChart is used to connect with patients for Virtual Visits (Telemedicine).  Patients are able to view lab/test results, encounter notes, upcoming appointments, etc.  Non-urgent messages can be sent to your provider as well.   To learn more about what you can do with MyChart, go to [email or web address].    Your next appointment:   12 month(s)  The format for your next appointment:   In Person  Provider:   You may see Larae Grooms, MD or one of the following Advanced Practice Providers on your designated Care Team:    Melina Copa, PA-C  Ermalinda Barrios, PA-C    Other Instructions  Wear compression stockings to assist with swelling.   Follow a low sodium diet.   Low-Sodium  Eating Plan Sodium, which is an element that makes up salt, helps you maintain a healthy balance of fluids in your body. Too much sodium can increase your blood pressure and cause fluid and waste to be held in your body. Your health care provider or dietitian may recommend following this plan if you have high blood pressure (hypertension), kidney disease, liver disease, or heart failure. Eating less sodium can help lower your blood pressure, reduce swelling, and protect your heart, liver, and kidneys. What are tips for following this plan? General guidelines  Most people on this plan should limit their sodium intake to 1,500-2,000 mg (milligrams) of sodium each day. Reading food labels   The Nutrition Facts label lists the amount of sodium in one serving of the food. If you eat more than one serving, you must multiply the listed amount of sodium by the number of servings.  Choose foods with less than 140 mg of sodium per serving.  Avoid foods with 300 mg of sodium or more per serving. Shopping  Look for lower-sodium products, often labeled as "low-sodium" or "no salt added."  Always check the sodium content even if foods are labeled as "unsalted" or "no salt added".  Buy fresh foods. ? Avoid canned foods and premade or frozen meals. ? Avoid canned, cured, or processed meats  Buy breads that have less than 80 mg of sodium per slice. Cooking  Eat more home-cooked food and less restaurant, buffet, and fast food.  Avoid adding salt when cooking. Use salt-free  seasonings or herbs instead of table salt or sea salt. Check with your health care provider or pharmacist before using salt substitutes.  Cook with plant-based oils, such as canola, sunflower, or olive oil. Meal planning  When eating at a restaurant, ask that your food be prepared with less salt or no salt, if possible.  Avoid foods that contain MSG (monosodium glutamate). MSG is sometimes added to [place] food, bouillon, and  some canned foods. What foods are recommended? The items listed may not be a complete list. Talk with your dietitian about what dietary choices are best for you. Grains Low-sodium cereals, including oats, puffed wheat and rice, and shredded wheat. Low-sodium crackers. Unsalted rice. Unsalted pasta. Low-sodium bread. Whole-grain breads and whole-grain pasta. Vegetables Fresh or frozen vegetables. "No salt added" canned vegetables. "No salt added" tomato sauce and paste. Low-sodium or reduced-sodium tomato and vegetable juice. Fruits Fresh, frozen, or canned fruit. Fruit juice. Meats and other protein foods Fresh or frozen (no salt added) meat, poultry, seafood, and fish. Low-sodium canned tuna and salmon. Unsalted nuts. Dried peas, beans, and lentils without added salt. Unsalted canned beans. Eggs. Unsalted nut butters. Dairy Milk. Soy milk. Cheese that is naturally low in sodium, such as ricotta cheese, fresh mozzarella, or Swiss cheese Low-sodium or reduced-sodium cheese. Cream cheese. Yogurt. Fats and oils Unsalted butter. Unsalted margarine with no trans fat. Vegetable oils such as canola or olive oils. Seasonings and other foods Fresh and dried herbs and spices. Salt-free seasonings. Low-sodium mustard and ketchup. Sodium-free salad dressing. Sodium-free light mayonnaise. Fresh or refrigerated horseradish. Lemon juice. Vinegar. Homemade, reduced-sodium, or low-sodium soups. Unsalted popcorn and pretzels. Low-salt or salt-free chips. What foods are not recommended? The items listed may not be a complete list. Talk with your dietitian about what dietary choices are best for you. Grains Instant hot cereals. Bread stuffing, pancake, and biscuit mixes. Croutons. Seasoned rice or pasta mixes. Noodle soup cups. Boxed or frozen macaroni and cheese. Regular salted crackers. Self-rising flour. Vegetables Sauerkraut, pickled vegetables, and relishes. Olives. [place] fries. Onion rings. Regular canned  vegetables (not low-sodium or reduced-sodium). Regular canned tomato sauce and paste (not low-sodium or reduced-sodium). Regular tomato and vegetable juice (not low-sodium or reduced-sodium). Frozen vegetables in sauces. Meats and other protein foods Meat or fish that is salted, canned, smoked, spiced, or pickled. Bacon, ham, sausage, hotdogs, corned beef, chipped beef, packaged lunch meats, salt pork, jerky, pickled herring, anchovies, regular canned tuna, sardines, salted nuts. Dairy Processed cheese and cheese spreads. Cheese curds. Blue cheese. Feta cheese. String cheese. Regular cottage cheese. Buttermilk. Canned milk. Fats and oils Salted butter. Regular margarine. Ghee. Bacon fat. Seasonings and other foods Onion salt, garlic salt, seasoned salt, table salt, and sea salt. Canned and packaged gravies. Worcestershire sauce. Tartar sauce. Barbecue sauce. Teriyaki sauce. Soy sauce, including reduced-sodium. Steak sauce. Fish sauce. Oyster sauce. Cocktail sauce. Horseradish that you find on the shelf. Regular ketchup and mustard. Meat flavorings and tenderizers. Bouillon cubes. Hot sauce and Tabasco sauce. Premade or packaged marinades. Premade or packaged taco seasonings. Relishes. Regular salad dressings. Salsa. Potato and tortilla chips. Corn chips and puffs. Salted popcorn and pretzels. Canned or dried soups. Pizza. Frozen entrees and pot pies. Summary  Eating less sodium can help lower your blood pressure, reduce swelling, and protect your heart, liver, and kidneys.  Most people on this plan should limit their sodium intake to 1,500-2,000 mg (milligrams) of sodium each day.  Canned, boxed, and frozen foods are high in sodium. Restaurant foods,  fast foods, and pizza are also very high in sodium. You also get sodium by adding salt to food.  Try to cook at home, eat more fresh fruits and vegetables, and eat less fast food, canned, processed, or prepared foods. This information is not intended  to replace advice given to you by your health care provider. Make sure you discuss any questions you have with your health care provider. Document Revised: 05/10/2017 Document Reviewed: 05/21/2016 Elsevier Patient Education  2020 [employer].

## 2019-09-16 ENCOUNTER — Other Ambulatory Visit: Payer: Medicare Other | Admitting: Orthotics

## 2019-09-25 ENCOUNTER — Ambulatory Visit: Payer: Medicare Other | Admitting: Internal Medicine

## 2019-09-29 ENCOUNTER — Telehealth (INDEPENDENT_AMBULATORY_CARE_PROVIDER_SITE_OTHER): Payer: Medicare Other | Admitting: Internal Medicine

## 2019-09-29 ENCOUNTER — Encounter: Payer: Self-pay | Admitting: Internal Medicine

## 2019-09-29 ENCOUNTER — Other Ambulatory Visit: Payer: Self-pay

## 2019-09-29 VITALS — Ht 64.0 in | Wt 214.0 lb

## 2019-09-29 DIAGNOSIS — R739 Hyperglycemia, unspecified: Secondary | ICD-10-CM

## 2019-09-29 DIAGNOSIS — F419 Anxiety disorder, unspecified: Secondary | ICD-10-CM

## 2019-09-29 DIAGNOSIS — F339 Major depressive disorder, recurrent, unspecified: Secondary | ICD-10-CM

## 2019-09-29 DIAGNOSIS — M79672 Pain in left foot: Secondary | ICD-10-CM

## 2019-09-29 DIAGNOSIS — J449 Chronic obstructive pulmonary disease, unspecified: Secondary | ICD-10-CM

## 2019-09-29 DIAGNOSIS — R7989 Other specified abnormal findings of blood chemistry: Secondary | ICD-10-CM

## 2019-09-29 DIAGNOSIS — G20 Parkinson's disease: Secondary | ICD-10-CM

## 2019-09-29 DIAGNOSIS — M79671 Pain in right foot: Secondary | ICD-10-CM

## 2019-09-29 DIAGNOSIS — N183 Chronic kidney disease, stage 3 unspecified: Secondary | ICD-10-CM | POA: Diagnosis not present

## 2019-09-29 DIAGNOSIS — Z85038 Personal history of other malignant neoplasm of large intestine: Secondary | ICD-10-CM

## 2019-09-29 DIAGNOSIS — E7800 Pure hypercholesterolemia, unspecified: Secondary | ICD-10-CM

## 2019-09-29 DIAGNOSIS — K219 Gastro-esophageal reflux disease without esophagitis: Secondary | ICD-10-CM | POA: Diagnosis not present

## 2019-09-29 DIAGNOSIS — Z85118 Personal history of other malignant neoplasm of bronchus and lung: Secondary | ICD-10-CM

## 2019-09-29 DIAGNOSIS — Z1211 Encounter for screening for malignant neoplasm of colon: Secondary | ICD-10-CM

## 2019-09-29 DIAGNOSIS — Z8589 Personal history of malignant neoplasm of other organs and systems: Secondary | ICD-10-CM

## 2019-09-29 NOTE — Progress Notes (Addendum)
Patient ID: Victoria Hanson, female   DOB: 1951-03-09, 69 y.o.   MRN: [id number]   Virtual Visit via telephone Note  This visit type was conducted due to national recommendations for restrictions regarding the COVID-19 pandemic (e.g. social distancing).  This format is felt to be most appropriate for this patient at this time.  All issues noted in this document were discussed and addressed.  No physical exam was performed (except for noted visual exam findings with Video Visits).   I connected with Victoria Hanson by telephone and verified that I am speaking with the correct person using two identifiers. Location patient: home Location provider: work Persons participating in the telephone visit: patient, provider  The limitations, risks, security and privacy concerns of performing an evaluation and management service by telephone and the availability of in person appointments have been discussed.  The patient expressed understanding and agreed to proceed.   Reason for visit: scheduled follow up.   HPI: She reports she is doing relatively well.  Just saw cardiology 09/15/19.  ECHO previously - normal EF.  No signs of CHF.  No CAD by CTA.  Recommended continuing medical/preventive management.  For her lower extremity swelling - compressions stockings recommended.  Recommended f/u in one year.  Tries to stay active.  No chest pain.  Breathing overall stable.  No abdominal pain.  Bowels moving.  Changing shoes - helps heel/foot pain.  Desires no further f/u.  Needs mammogram.  She is agreeable to referral for colonoscopy.  Notices some breathing change when weather changes.  Has f/u next week with pulmonary.  She is no longer taking hctz.  Instructed to stop and remain off potassium for now.     ROS: See pertinent positives and negatives per HPI.  Past Medical History:  Diagnosis Date  . Anxiety   . Asthma   . Atypical chest pain    a. 2003 Cath: reportedly nl per pt [name]);  b. 04/2015 [place]  Admission, neg troponin.  . Colon cancer ([place]) 1999   a. 1999: Pt had partial colectomy. Did develop lung metastasis. Had right upper lobectomy in 01/2007 then had associated chemotherapy.  Marland Kitchen COPD (chronic obstructive pulmonary disease) ([place])   . DDD (degenerative disc disease), lumbosacral   . Depression   . Echocardiogram abnormal    a. 02/2010 Echo: EF 55-60%, mild LAE, no regional wall motion abnormalities  . Elevated transaminase level    a. ? NASH;  b. 06/2015 - nl LFTs.  . GERD (gastroesophageal reflux disease)   . Hematuria   . Holter monitor, abnormal    a. 12/2009 Holter monitoring: NSR, rare PACs and PVCs.  Marland Kitchen Hypercholesterolemia   . Hyperlipidemia   . Hypertension   . Leaky heart valve    a. Per pt report - no evidence of valvular abnormalities on prior echoes.  . Lesion of skin of Right breast 10/20/2017  . Lung metastases ([place]) 2008   a. S/P resection (lobectomy - right)  . Morbid obesity ([place])   . Nephrolithiasis   . Parkinson disease ([place]) 08/2016   [name]    Past Surgical History:  Procedure Laterality Date  . BREAST EXCISIONAL BIOPSY Right 10/07/2009   benign  . CARDIAC CATHETERIZATION  2003   negative as per pt report  . CHOLECYSTECTOMY  2000  . COLON SURGERY     Colon cancer, removed part of large colon  . COLONOSCOPY N/A 04/25/2015   Procedure: COLONOSCOPY;  Surgeon: Manya Silvas, MD;  Location: [facility]  ENDOSCOPY;  Service: Endoscopy;  Laterality: N/A;  . COLONOSCOPY WITH PROPOFOL N/A 06/14/2017   Procedure: COLONOSCOPY WITH PROPOFOL;  Surgeon: Manya Silvas, MD;  Location: [facility] ENDOSCOPY;  Service: Endoscopy;  Laterality: N/A;  . LOBECTOMY  01/2007   Right lung, upper lobe right side removed  . TOTAL ABDOMINAL HYSTERECTOMY     abnormal bleeding  . TUBAL LIGATION  1980    Family History  Problem Relation Age of Onset  . Coronary artery disease Mother        died @ 23 of MI.  . Mental illness Mother   . Diabetes Mother   . Heart attack Father  80       MI  . Cancer Father        lung - died in his mid-70's  . Hypertension Father   . Diabetes Father   . Sudden death Brother   . Mental illness Brother   . Heart attack Brother        MI @ 66, died @ age 3.  . Breast cancer Neg Hx     SOCIAL HX: reviewed.    Current Outpatient Medications:  .  ALPRAZolam (XANAX) 0.5 MG tablet, TAKE 1/2 TABLET BY MOUTH EVERY DAY AS NEEDED, Disp: 30 tablet, Rfl: 0 .  aspirin EC 81 MG tablet, Take 81 mg by mouth as needed. , Disp: , Rfl:  .  carbidopa-levodopa (SINEMET) 25-100 MG tablet, Take 1.5 in am and one at 11:00 and one in afternoon and 1.5 at night., Disp: 150 tablet, Rfl: 0 .  citalopram (CELEXA) 10 MG tablet, Taking 1 and half tablet daily., Disp: 90 tablet, Rfl: 1 .  ferrous sulfate 325 (65 FE) MG tablet, Take 325 mg by mouth daily with breakfast., Disp: , Rfl:  .  fluticasone (FLONASE) 50 MCG/ACT nasal spray, Place 2 sprays into both nostrils daily., Disp: 16 g, Rfl: 6 .  fluticasone-salmeterol (ADVAIR HFA) 115-21 MCG/ACT inhaler, Inhale 2 puffs into the lungs 2 (two) times daily., Disp: 1 Inhaler, Rfl: 4 .  Multiple Vitamin (MULTIVITAMIN) tablet, Take 1 tablet by mouth daily., Disp: , Rfl:  .  omeprazole (PRILOSEC) 20 MG capsule, Take 1 capsule (20 mg total) by mouth 2 (two) times daily before a meal., Disp: 180 capsule, Rfl: 1 .  potassium chloride SA (K-DUR) 20 MEQ tablet, TAKE 1 TABLET BY MOUTH TWICE A DAY, Disp: 180 tablet, Rfl: 1 .  PROAIR HFA 108 (90 Base) MCG/ACT inhaler, INHALE 2 PUFFS BY MOUTH INTO THE LUNGS EVERY 6 HOURS AS NEEDED FOR WHEEZING, Disp: 8.5 g, Rfl: 2 .  rosuvastatin (CRESTOR) 5 MG tablet, TAKE 1 TABLET BY MOUTH ON MONDAY, WEDNESDAY AND FRIDAY, Disp: 13 tablet, Rfl: 1 .  traZODone (DESYREL) 50 MG tablet, TAKE 2 TABLETS BY MOUTH AT BEDTIME, Disp: 180 tablet, Rfl: 1 .  vitamin B-12 (CYANOCOBALAMIN) 100 MCG tablet, Take 100 mcg by mouth daily., Disp: , Rfl:   EXAM:  GENERAL: alert.  Sounds to be in no acute  distress.  Answering questions appropriately.    PSYCH/NEURO: pleasant and cooperative, no obvious depression or anxiety, speech and thought processing grossly intact  ASSESSMENT AND PLAN:  Discussed the following assessment and plan:  Acid reflux On omeprazole.  Controlled.   Anxiety, mild On citalopram.  Stable.    Chronic obstructive pulmonary disease [facility]) Previously saw Dr Alva Garnet.  Breathing stable.  Has f/u with pulmonary next week.    CKD (chronic kidney disease), stage III Avoid antiinflammatories.  Stay hydrated.  Of hctz.  Follow metabolic panel.   Depression, recurrent ([place]) On citalopram.  Trazodone to help with sleep.  Follow.  Stable.   Elevated liver function tests Follow liver panel.   Foot pain, bilateral Discussed podiatry f/u.  She does not feel needs at this time.  Follow.    GERD (gastroesophageal reflux disease) Controlled on omeprazole.    History of colon cancer Colonoscopy 06/2017 - hyperplastic polyp (x2).    History of malignant neoplasm metastatic to lung S/p lobectomy.  Keep f/u with pulmonary.  Breathing overall stable.    Hypercholesterolemia On crestor.  Low cholesterol diet and exercise.  Follow lipid panel and liver function tests.    Hyperglycemia Low carb diet and exercise.  Follow met b and a1c.   Parkinson's disease ([place]) On sinemet.  Followed by neurology.     Orders Placed This Encounter  Procedures  . Ambulatory referral to Gastroenterology    Referral Priority:   Routine    Referral Type:   Consultation    Referral Reason:   Specialty Services Required    Number of Visits Requested:   1    No orders of the defined types were placed in this encounter.    I discussed the assessment and treatment plan with the patient. The patient was provided an opportunity to ask questions and all were answered. The patient agreed with the plan and demonstrated an understanding of the instructions.   The patient was advised to call  back or seek an in-person evaluation if the symptoms worsen or if the condition fails to improve as anticipated.  I provided 23 minutes of non-face-to-face time during this encounter.   Einar Pheasant, MD

## 2019-10-02 IMAGING — MG MM DIGITAL DIAGNOSTIC BILAT W/ TOMO W/ CAD
6 of 12 series · 6 of 36 positions shown · non-contrast
Comparison: Previous exam(s).

CLINICAL DATA: 67-year-old female with intermittent, shooting
lateral right breast pain. No focal symptoms.

EXAM:
DIGITAL DIAGNOSTIC BILATERAL MAMMOGRAM WITH CAD AND TOMO

[R MLO synth-2D]
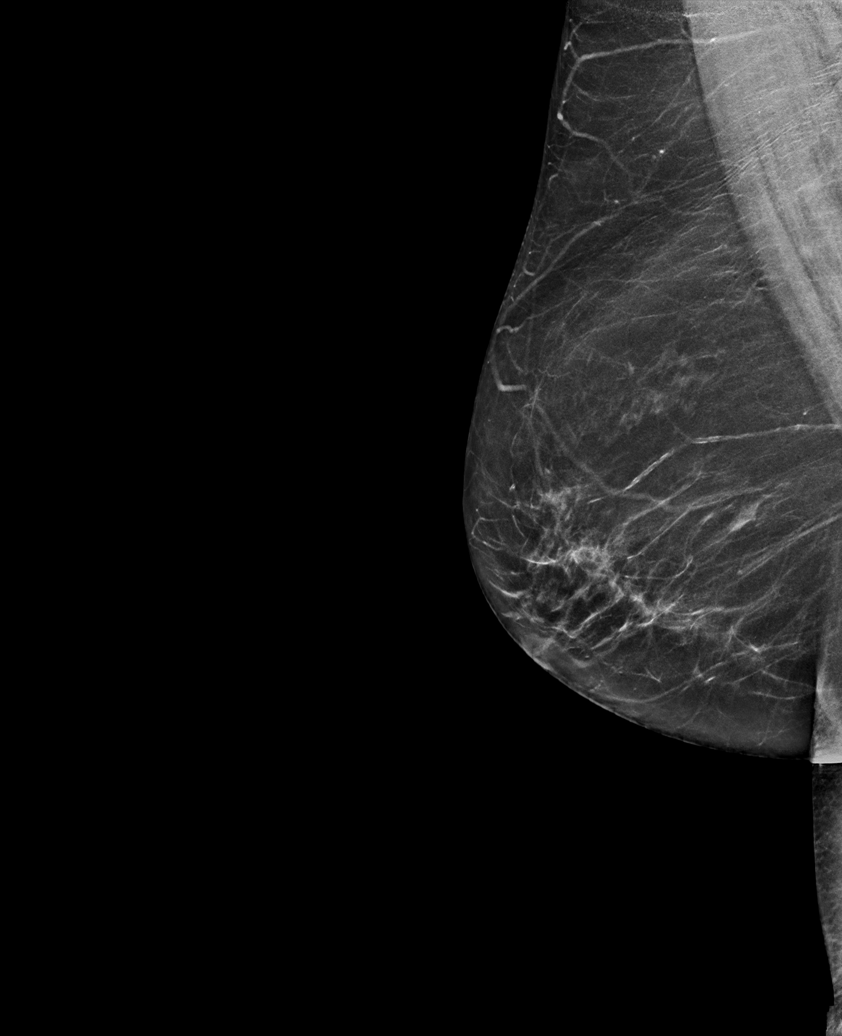

[L MLO synth-2D (1 of 2)]
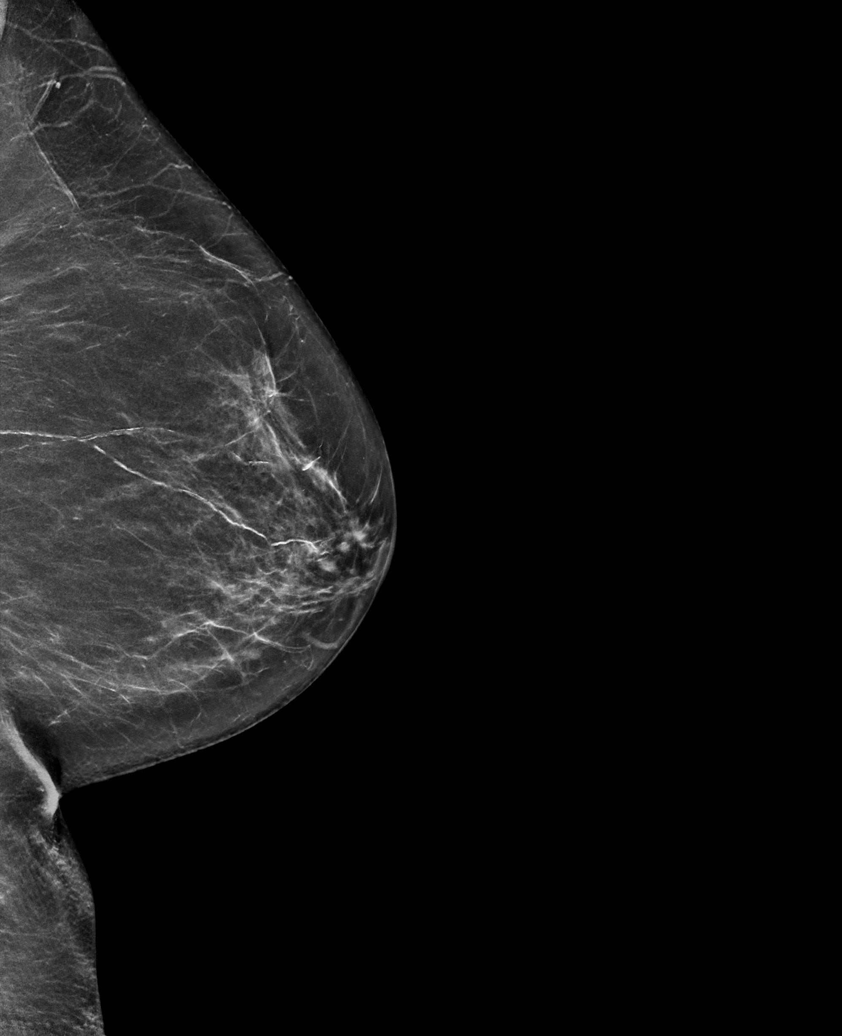

[L MLO synth-2D (2 of 2)]
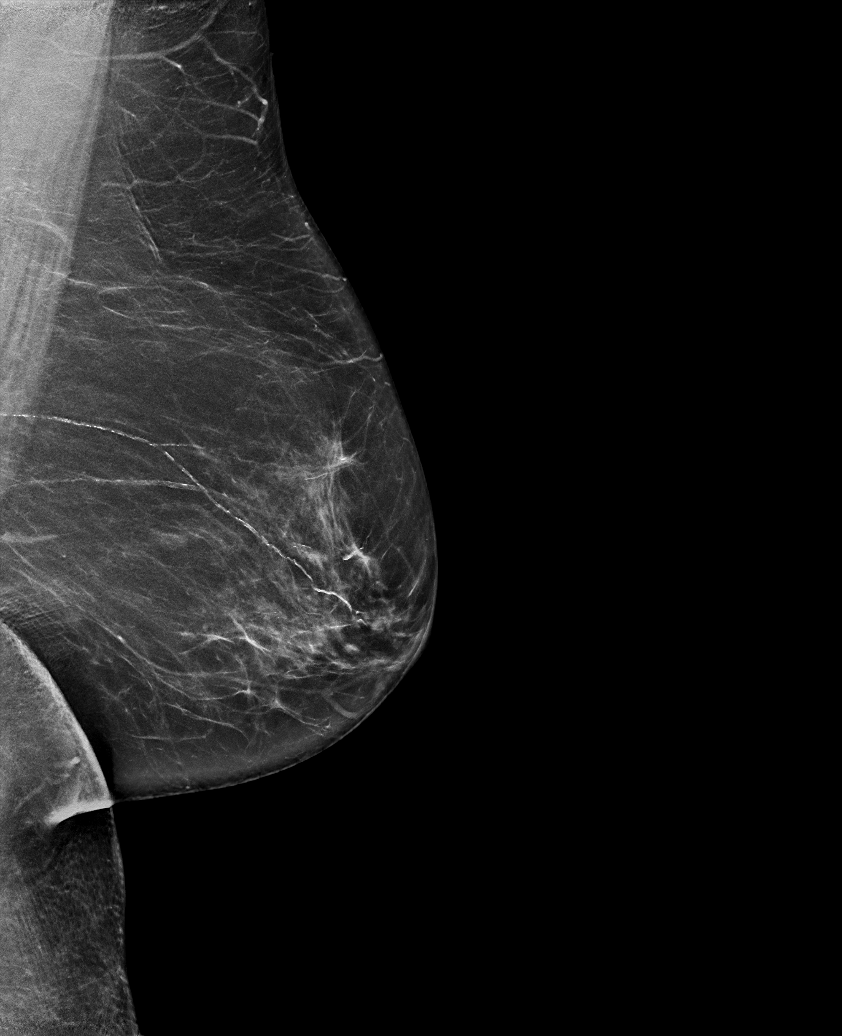

[R CC synth-2D]
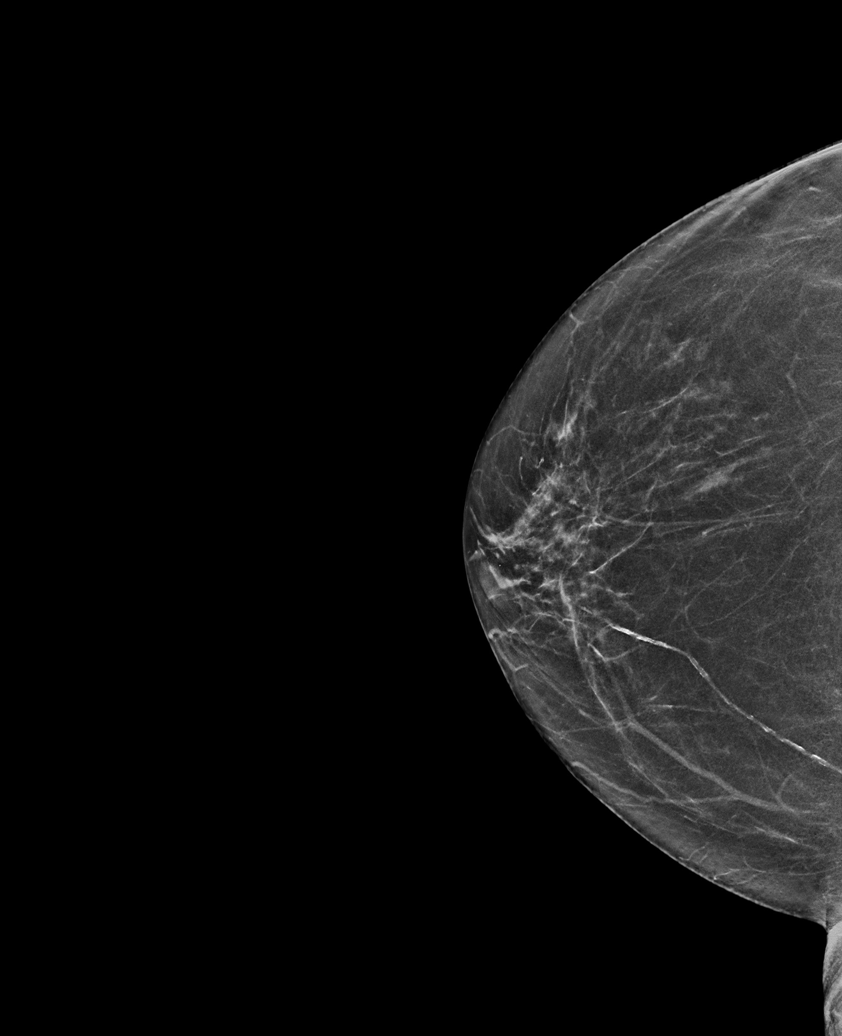

[R XCCL synth-2D]
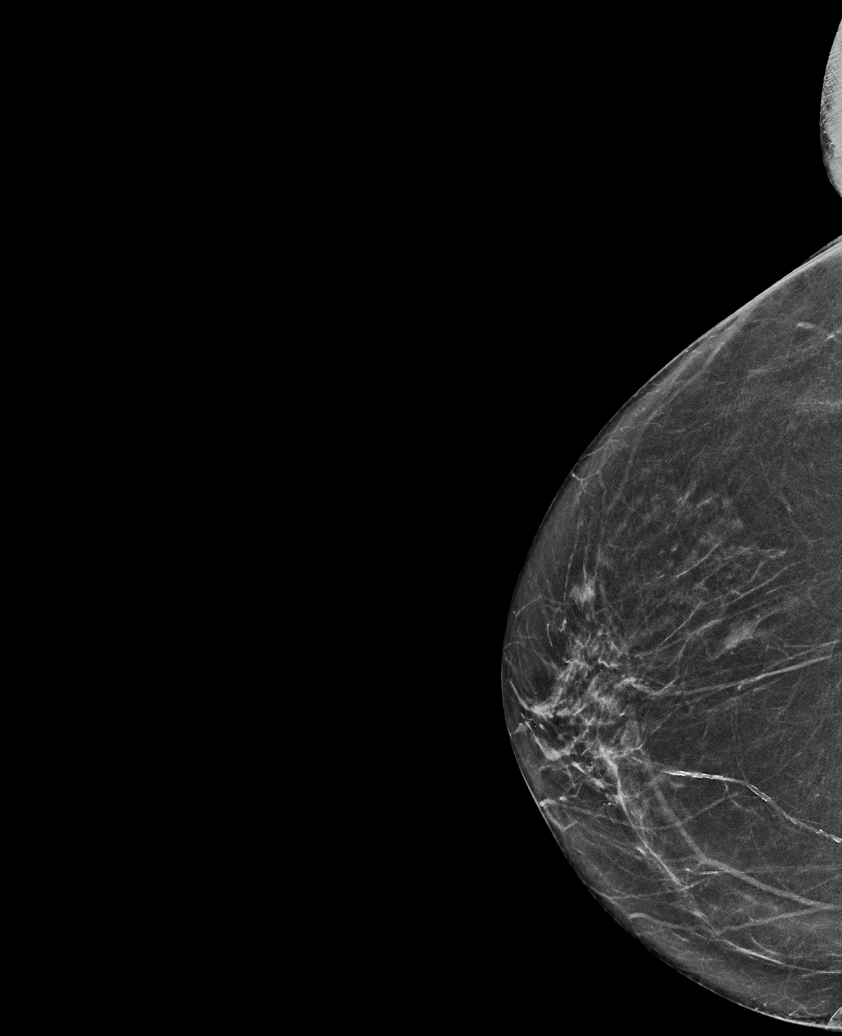

[L CC synth-2D]
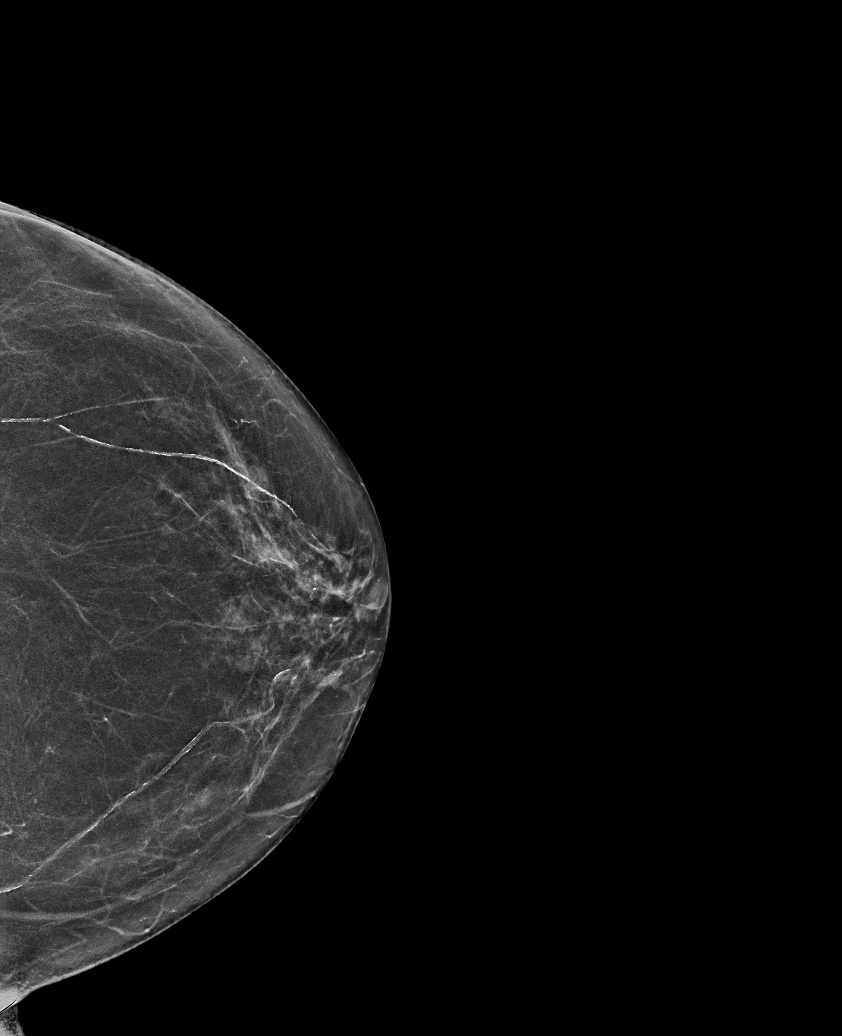

[6 of 36 positions shown; findings below may reference images not displayed]

ACR Breast Density Category b: There are scattered areas of
fibroglandular density.
FINDINGS: No suspicious mammographic findings are identified in either breast.
The parenchymal pattern is stable.

Mammographic images were processed with CAD.
IMPRESSION: No mammographic evidence of malignancy.

RECOMMENDATION:
1. Clinical follow-up recommended for the painful area of concern in
the right breast. Any further workup should be based on clinical
grounds.
2.  Screening mammogram in one year.(Code:[id number])

I have discussed the findings and recommendations with the patient.
Results were also provided in writing at the conclusion of the
visit. If applicable, a reminder letter will be sent to the patient
regarding the next appointment.

BI-RADS CATEGORY  1: Negative.

## 2019-10-04 ENCOUNTER — Encounter: Payer: Self-pay | Admitting: Internal Medicine

## 2019-10-04 NOTE — Assessment & Plan Note (Signed)
Avoid antiinflammatories.  Stay hydrated.  Of hctz.  Follow metabolic panel.

## 2019-10-04 NOTE — Assessment & Plan Note (Signed)
On omeprazole.  Controlled.   

## 2019-10-04 NOTE — Assessment & Plan Note (Signed)
On crestor.  Low cholesterol diet and exercise.  Follow lipid panel and liver function tests.   

## 2019-10-04 NOTE — Assessment & Plan Note (Signed)
Previously saw Dr Alva Garnet.  Breathing stable.  Has f/u with pulmonary next week.

## 2019-10-04 NOTE — Assessment & Plan Note (Signed)
On sinemet.  Followed by neurology.

## 2019-10-04 NOTE — Assessment & Plan Note (Signed)
Follow liver panel.  

## 2019-10-04 NOTE — Assessment & Plan Note (Signed)
On citalopram.  Trazodone to help with sleep.  Follow.  Stable.

## 2019-10-04 NOTE — Assessment & Plan Note (Signed)
S/p lobectomy.  Keep f/u with pulmonary.  Breathing overall stable.

## 2019-10-04 NOTE — Assessment & Plan Note (Signed)
Low carb diet and exercise.  Follow met b and a1c.  

## 2019-10-04 NOTE — Assessment & Plan Note (Signed)
On citalopram.  Stable.

## 2019-10-04 NOTE — Assessment & Plan Note (Signed)
Colonoscopy 06/2017 - hyperplastic polyp (x2).

## 2019-10-04 NOTE — Addendum Note (Signed)
Addended by: Alisa Graff on: 10/04/2019 07:28 PM   Modules accepted: Orders

## 2019-10-04 NOTE — Assessment & Plan Note (Signed)
Discussed podiatry f/u.  She does not feel needs at this time.  Follow.

## 2019-10-04 NOTE — Assessment & Plan Note (Signed)
Controlled on omeprazole.   

## 2019-10-07 ENCOUNTER — Other Ambulatory Visit (INDEPENDENT_AMBULATORY_CARE_PROVIDER_SITE_OTHER): Payer: Medicare Other

## 2019-10-07 ENCOUNTER — Other Ambulatory Visit: Payer: Medicare Other

## 2019-10-07 ENCOUNTER — Other Ambulatory Visit: Payer: Self-pay

## 2019-10-07 ENCOUNTER — Encounter: Payer: Self-pay | Admitting: Pulmonary Disease

## 2019-10-07 ENCOUNTER — Ambulatory Visit (INDEPENDENT_AMBULATORY_CARE_PROVIDER_SITE_OTHER): Payer: Medicare Other | Admitting: Pulmonary Disease

## 2019-10-07 VITALS — BP 136/84 | HR 67 | Temp 96.8°F | Ht 64.0 in | Wt 213.0 lb

## 2019-10-07 DIAGNOSIS — I10 Essential (primary) hypertension: Secondary | ICD-10-CM

## 2019-10-07 DIAGNOSIS — R0602 Shortness of breath: Secondary | ICD-10-CM | POA: Diagnosis not present

## 2019-10-07 DIAGNOSIS — R739 Hyperglycemia, unspecified: Secondary | ICD-10-CM

## 2019-10-07 DIAGNOSIS — D649 Anemia, unspecified: Secondary | ICD-10-CM | POA: Diagnosis not present

## 2019-10-07 DIAGNOSIS — E7800 Pure hypercholesterolemia, unspecified: Secondary | ICD-10-CM

## 2019-10-07 DIAGNOSIS — J4540 Moderate persistent asthma, uncomplicated: Secondary | ICD-10-CM

## 2019-10-07 LAB — HEPATIC FUNCTION PANEL
ALT: 4 U/L (ref 0–35)
AST: 15 U/L (ref 0–37)
Albumin: 3.8 g/dL (ref 3.5–5.2)
Alkaline Phosphatase: 75 U/L (ref 39–117)
Bilirubin, Direct: 0.1 mg/dL (ref 0.0–0.3)
Total Bilirubin: 0.3 mg/dL (ref 0.2–1.2)
Total Protein: 6.6 g/dL (ref 6.0–8.3)

## 2019-10-07 LAB — BASIC METABOLIC PANEL
BUN: 19 mg/dL (ref 6–23)
CO2: 27 mEq/L (ref 19–32)
Calcium: 9.2 mg/dL (ref 8.4–10.5)
Chloride: 105 mEq/L (ref 96–112)
Creatinine, Ser: 1.16 mg/dL (ref 0.40–1.20)
GFR: 46.2 mL/min — ABNORMAL LOW (ref >60.00)
Glucose, Bld: 99 mg/dL (ref 70–99)
Potassium: 3.8 mEq/L (ref 3.5–5.1)
Sodium: 139 mEq/L (ref 135–145)

## 2019-10-07 LAB — FERRITIN: Ferritin: 36.6 ng/mL (ref 10.0–291.0)

## 2019-10-07 LAB — LIPID PANEL
Cholesterol: 161 mg/dL (ref 0–200)
HDL: 54.1 mg/dL (ref >39.00)
LDL Cholesterol: 87 mg/dL (ref 0–99)
NonHDL: 106.55
Total CHOL/HDL Ratio: 3
Triglycerides: 96 mg/dL (ref 0.0–149.0)
VLDL: 19.2 mg/dL (ref 0.0–40.0)

## 2019-10-07 LAB — HEMOGLOBIN A1C: Hgb A1c MFr Bld: 5.9 % (ref 4.6–6.5)

## 2019-10-07 MED ORDER — TRELEGY ELLIPTA 200-62.5-25 MCG/INH IN AEPB: 1.0000 | INHALATION_SPRAY | Freq: Every day | RESPIRATORY_TRACT | 0 refills | Status: DC

## 2019-10-07 NOTE — Progress Notes (Signed)
Subjective:    Patient ID: Victoria Hanson, female    DOB: [birth year], 69 y.o.   MRN: [id number] Requesting MD/Service: Eliezer Lofts, MD Date of initial consultation: 07/06/16 by Dr. Gwendolyn Lima Reason for consultation: COPD  PT PROFILE: 69 y.o.F never smoker but with significant second hand smoke exposure and prior employment in [employer]. S/P RUL resection in 2008 for lung cancer. Carries diagnosis of COPD. Referred for eval of class II-III dyspnea.  DATA: Echocardiogram 06/21/15: normal LVEF, mildly dilated LA. RVSP est 37 mmHg CT chest 03/10/16: No evidence of pulmonary embolism. No acute findings in the thorax to account for the patient's symptoms. Status post right upper lobectomy PFT 08/07/16: FVC:  2.43L (87 %pred), FEV1:  1.61L (79 %pred), FEV1/FVC: 66% , TLC: 4.68 L (101 %pred), DLCO 61 %pred, DLCO/VA 110% PFT 11/26/17: FVC: 2.28 L (83 %pred), FEV1: 1.65 L (75 %pred), FEV1/FVC: 72%, TLC: 4.35 L (94 %pred), DLCO 59 %pred, DLCO/VA 105% PFTs 11/25/18: FVC: 2.06 L (70 %pred), FEV1: 1.46 L (66 %pred), FEV1/FVC: 71%, TLC: 3.64 L (74 %pred), DLCO 77 %pred, DLCO/VA 108% predicted.  Flow volume curve consistent with obstruction.  No significant improvement after bronchodilator therapy.   INTERVAL:  Last visit  12/09/2018 with Dr. Alva Garnet, that was a by phone visit.  No major events since that time  HPI This is a 69 year old woman lifelong never smoker with significant secondhand smoke exposure prior patient of Dr. Merton Border.  She has been diagnosed as having COPD.  Prior pulmonary function testing consistent with obstruction with no significant bronchodilator therapy.  Patient presents for follow-up, this is a scheduled visit.  I am assuming her care as Dr. Alva Garnet has left the practice.  The patient is compliant with her medications.  Does not feel that these resolve her dyspnea on exertion.  I suspect most of her issues are secondary to moderate persistent asthma with COPD  features due to airway remodeling hence the lack of bronchodilator response.  She has not had any fevers, chills or sweats.  No cough or sputum production.  No gastroesophageal reflux symptoms.  No chest pain.  She voices no other complaint.   Review of Systems A 10 point review of systems was performed and it is as noted above otherwise negative.  Patient Active Problem List   Diagnosis Date Noted  . Near syncope 02/13/2019  . Fibromuscular dysplasia ([place]) 02/02/2019  . Abdominal bloating 01/04/2019  . Unsteady gait 12/03/2018  . History of UTI 10/08/2018  . Ear fullness, right 10/08/2018  . B12 deficiency 03/11/2018  . Discharge from right nipple 11/10/2017  . Goals of care, counseling/discussion 10/15/2017  . Parkinson's disease ([place]) 07/31/2017  . Chronic obstructive pulmonary disease ([place]) 07/31/2017  . REM behavioral disorder 05/28/2017  . Elevated liver function tests 08/25/2016  . Carotid artery disease ([place]) 07/19/2016  . Tremor 06/01/2016  . Osteoporosis 04/08/2016  . Foot pain, bilateral 07/24/2015  . Head pain 07/24/2015  . Hyperlipidemia 06/21/2015  . Other fatigue 06/21/2015  . BMI 33.0-33.9,adult 06/21/2015  . Morbid obesity ([place])   . Disorder of kidney and ureter 03/28/2015  . Hyperglycemia 03/20/2015  . Swelling of lower extremity 11/20/2014  . Dysuria 10/24/2014  . Health care maintenance 07/17/2014  . Hypokalemia 04/05/2013  . TIA (transient ischemic attack) 02/09/2013  . Transient cerebral ischemia 02/09/2013  . Anxiety, mild 03/13/2012  . Depression, recurrent ([place]) 03/13/2012  . History of colon cancer 03/13/2012  . Hypertension 03/13/2012  . CKD (chronic kidney  disease), stage III ([place]) 03/13/2012  . GERD (gastroesophageal reflux disease) 03/13/2012  . Acid reflux 03/13/2012  . History of malignant neoplasm metastatic to lung 03/13/2012  . Major depressive disorder with single episode 03/13/2012  . Hypercholesterolemia 02/02/2010  . SOB  (shortness of breath) 02/02/2010  . Pure hypercholesterolemia 02/02/2010   Social History   Tobacco Use  . Smoking status: Passive Smoke Exposure - Never Smoker  . Smokeless tobacco: Never Used  . Tobacco comment: husband and son smoked in her home.  Substance Use Topics  . Alcohol use: Not Currently    Alcohol/week: 0.0 standard drinks   Allergies  Allergen Reactions  . Macrobid [Nitrofurantoin Macrocrystal] Shortness Of Breath and Palpitations  . Antihistamines, Diphenhydramine-Type Other (See Comments)    She does not know which antihistamine she has an allergic reaction to.  . Aspirin Other (See Comments)    Makes her sick on the stomach if she takes too many and causes bleeding.  Of note, she can take ibuprofen. Other reaction(s): Other (See Comments) Makes her sick on the stomach.Of note, she can take ibuprofen. Makes her sick on the stomach. Of note, she can take ibuprofen.  . Ceftin [Cefuroxime Axetil] Other (See Comments)    Chest pains  . Celecoxib Other (See Comments)    Reaction: Blood Disorder GI bleeding.  Marland Kitchen Cymbalta [Duloxetine Hcl] Other (See Comments)    Reaction: unknown  . Diphenhydramine Other (See Comments)  . Escitalopram Other (See Comments)  . Lexapro [Escitalopram Oxalate] Other (See Comments)    Reaction: Difficulty with speech  . Metronidazole Other (See Comments)    Other reaction(s): Unknown  . Nitrofurantoin Other (See Comments)  . Zoloft [Sertraline Hcl]   . Ciprofloxacin Other (See Comments)    Reaction: unknown   Immunization History  Administered Date(s) Administered  . Fluad Quad(high Dose 65+) 02/26/2019  . Influenza, High Dose Seasonal PF 04/17/2017, 04/16/2018  . Influenza,inj,Quad PF,6+ Mos 04/02/2013, 04/07/2014, 03/15/2015, 02/03/2016  . Pneumococcal Conjugate-13 01/11/2015  . Pneumococcal Polysaccharide-23 01/14/2017  . Tdap 01/08/2020    Medications were reviewed.  For her respiratory issues: She is currently on Advair  HFA 115/21 2 puffs twice a day, Flonase, montelukast and as needed albuterol.  She does use a holding chamber for her inhaler.     Objective:   Physical Exam BP 136/84 (BP Location: Left Arm, Cuff Size: Normal)   Pulse 67   Temp (!) 96.8 F (36 C) (Temporal)   Ht 5\' 4"  (1.626 m)   Wt 213 lb (96.6 kg)   SpO2 98% Comment: on RA  BMI 36.56 kg/m  Gen: NAD, fully ambulatory.  No residual dyspnea HEENT: NCAT, sclerae anicteric  Neck: No JVD, trachea midline, no JVD Lungs: breath sounds full, no wheezes or other adventitious sounds Cardiovascular: RRR, no murmurs Abdomen: Soft, nontender, normal BS Ext: without clubbing, cyanosis, edema Neuro: grossly intact Skin: Limited exam, no lesions noted    Assessment & Plan:     ICD-10-CM   1. Moderate persistent asthma without complication  V56.43    Suspect the patient's issues are more related to moderate persistent asthma COPD features likely due to airway remodeling Trial of Trelegy Ellipta  2. SOB (shortness of breath)  R06.02    Likely related to poorly compensated asthma Airway remodeling may be playing a role   Meds ordered this encounter  Medications  . Fluticasone-Umeclidin-Vilant (TRELEGY ELLIPTA) 200-62.5-25 MCG/INH AEPB    Sig: Inhale 1 puff into the lungs daily.  Dispense:  28 each    Refill:  0    Order Specific Question:   Lot Number?    Answer:   [id number]    Order Specific Question:   Expiration Date?    Answer:   01/09/2021    Order Specific Question:   Quantity    Answer:   1    Comments:   OF EACH   We will see her in follow-up in 2 months time she is to contact us prior to that time should any new difficulties arise.  Renold Don, MD [facility]   *This note was dictated using voice recognition software/Dragon.  Despite best efforts to proofread, errors can occur which can change the meaning.  Any change was purely unintentional.

## 2019-10-07 NOTE — Patient Instructions (Signed)
We are going to give you a trial of Trelegy Ellipta , this will be puff daily.  Please rinse your mouth well after use.  We are giving you a 97-month supply.  Please let us know how you are doing with it after your first box of sample.  If you are doing well we will call the prescription into your pharmacy.  Do not use ADVAIR while using the Trelegy.  You may still use your red inhaler (ProAir) as needed for shortness of breath during the day.  We will see you back in 2 months time call sooner should any new problems arise.

## 2019-10-07 NOTE — Addendum Note (Signed)
Addended by: Leeanne Rio on: 10/07/2019 09:08 AM   Modules accepted: Orders

## 2019-10-08 ENCOUNTER — Telehealth: Payer: Medicare Other

## 2019-10-08 LAB — CBC WITH DIFFERENTIAL/PLATELET
Basophils Absolute: 0.1 10*3/uL (ref 0.0–0.1)
Basophils Relative: 1 % (ref 0.0–3.0)
Eosinophils Absolute: 0.1 10*3/uL (ref 0.0–0.7)
Eosinophils Relative: 1.4 % (ref 0.0–5.0)
HCT: 35.4 % — ABNORMAL LOW (ref 36.0–46.0)
Hemoglobin: 11.7 g/dL — ABNORMAL LOW (ref 12.0–15.0)
Lymphocytes Relative: 19.9 % (ref 12.0–46.0)
Lymphs Abs: 1.6 10*3/uL (ref 0.7–4.0)
MCHC: 33.1 g/dL (ref 30.0–36.0)
MCV: 87.9 fl (ref 78.0–100.0)
Monocytes Absolute: 0.4 10*3/uL (ref 0.1–1.0)
Monocytes Relative: 5.1 % (ref 3.0–12.0)
Neutro Abs: 5.8 10*3/uL (ref 1.4–7.7)
Neutrophils Relative %: 72.6 % (ref 43.0–77.0)
Platelets: 228 10*3/uL (ref 150.0–400.0)
RBC: 4.02 Mil/uL (ref 3.87–5.11)
RDW: 14.3 % (ref 11.5–15.5)
WBC: 8 10*3/uL (ref 4.0–10.5)

## 2019-10-13 ENCOUNTER — Other Ambulatory Visit: Payer: Self-pay

## 2019-10-13 ENCOUNTER — Telehealth (INDEPENDENT_AMBULATORY_CARE_PROVIDER_SITE_OTHER): Payer: Self-pay | Admitting: Gastroenterology

## 2019-10-13 DIAGNOSIS — Z85038 Personal history of other malignant neoplasm of large intestine: Secondary | ICD-10-CM

## 2019-10-13 NOTE — Progress Notes (Signed)
Gastroenterology Pre-Procedure Review  Request Date: Tuesday 11/03/19 Requesting Physician: Dr. Vicente Males  PATIENT REVIEW QUESTIONS: The patient responded to the following health history questions as indicated:    1. Are you having any GI issues? yes (constipation) 2. Do you have a personal history of Polyps? yes (2016 with Dr. Vira Agar, personal history of colon cancer) 3. Do you have a family history of Colon Cancer or Polyps? no 4. Diabetes Mellitus? no 5. Joint replacements in the past 12 months?no 6. Major health problems in the past 3 months?no 7. Any artificial heart valves, MVP, or defibrillator?no    MEDICATIONS & ALLERGIES:    Patient reports the following regarding taking any anticoagulation/antiplatelet therapy:   Plavix, Coumadin, Eliquis, Xarelto, Lovenox, Pradaxa, Brilinta, or Effient? no Aspirin? no  Patient confirms/reports the following medications:  Current Outpatient Medications  Medication Sig Dispense Refill  . ALPRAZolam (XANAX) 0.5 MG tablet TAKE 1/2 TABLET BY MOUTH EVERY DAY AS NEEDED 30 tablet 0  . aspirin EC 81 MG tablet Take 81 mg by mouth as needed.     . calcium-vitamin D (OSCAL WITH D) 500-200 MG-UNIT tablet Take 1 tablet by mouth.    . calcium-vitamin D (OSCAL WITH D) 500-200 MG-UNIT tablet Take 1 tablet by mouth.    . carbidopa-levodopa (SINEMET) 25-100 MG tablet Take 1.5 in am and one at 11:00 and one in afternoon and 1.5 at night. 150 tablet 0  . citalopram (CELEXA) 10 MG tablet Taking 1 and half tablet daily. 90 tablet 1  . ferrous sulfate 325 (65 FE) MG tablet Take 325 mg by mouth daily with breakfast.    . fluticasone-salmeterol (ADVAIR HFA) 115-21 MCG/ACT inhaler Inhale 2 puffs into the lungs 2 (two) times daily. 1 Inhaler 4  . Fluticasone-Umeclidin-Vilant (TRELEGY ELLIPTA) 200-62.5-25 MCG/INH AEPB Inhale 1 puff into the lungs daily. 28 each 0  . omeprazole (PRILOSEC) 20 MG capsule Take 1 capsule (20 mg total) by mouth 2 (two) times daily before a  meal. 180 capsule 1  . PROAIR HFA 108 (90 Base) MCG/ACT inhaler INHALE 2 PUFFS BY MOUTH INTO THE LUNGS EVERY 6 HOURS AS NEEDED FOR WHEEZING 8.5 g 2  . rosuvastatin (CRESTOR) 5 MG tablet TAKE 1 TABLET BY MOUTH ON MONDAY, WEDNESDAY AND FRIDAY 13 tablet 1  . traZODone (DESYREL) 50 MG tablet TAKE 2 TABLETS BY MOUTH AT BEDTIME 180 tablet 1  . vitamin B-12 (CYANOCOBALAMIN) 100 MCG tablet Take 100 mcg by mouth daily.     No current facility-administered medications for this visit.    Patient confirms/reports the following allergies:  Allergies  Allergen Reactions  . Macrobid [Nitrofurantoin Macrocrystal] Shortness Of Breath and Palpitations  . Antihistamines, Diphenhydramine-Type Other (See Comments)    She does not know which antihistamine she has an allergic reaction to.  . Aspirin Other (See Comments)    Makes her sick on the stomach if she takes too many and causes bleeding.  Of note, she can take ibuprofen. Other reaction(s): Other (See Comments) Makes her sick on the stomach.Of note, she can take ibuprofen. Makes her sick on the stomach. Of note, she can take ibuprofen.  . Ceftin [Cefuroxime Axetil] Other (See Comments)    Chest pains  . Celecoxib Other (See Comments)    Reaction: Blood Disorder GI bleeding.  Marland Kitchen Cymbalta [Duloxetine Hcl] Other (See Comments)    Reaction: unknown  . Diphenhydramine Other (See Comments)  . Escitalopram Other (See Comments)  . Lexapro [Escitalopram Oxalate] Other (See Comments)    Reaction: Difficulty  with speech  . Metronidazole Other (See Comments)    Other reaction(s): Unknown  . Nitrofurantoin Other (See Comments)  . Zoloft [Sertraline Hcl]   . Ciprofloxacin Other (See Comments)    Reaction: unknown    Orders Placed This Encounter  Procedures  . Procedural/ Surgical Case Request: COLONOSCOPY WITH PROPOFOL    Standing Status:   Standing    Number of Occurrences:   1    Order Specific Question:   Pre-op diagnosis    Answer:   history of  colon cancer    Order Specific Question:   CPT Code    Answer:   85927    AUTHORIZATION INFORMATION Primary Insurance: 1D#: Group #:  Secondary Insurance: 1D#: Group #:  SCHEDULE INFORMATION: Date: Tuesday 11/03/19 Time: Location:[facility]

## 2019-10-14 ENCOUNTER — Other Ambulatory Visit: Payer: Self-pay

## 2019-10-14 ENCOUNTER — Emergency Department
Admission: EM | Admit: 2019-10-14 | Discharge: 2019-10-15 | Disposition: A | Discharge status: Home / Self Care | Payer: Medicare Other | Attending: Emergency Medicine | Admitting: Emergency Medicine

## 2019-10-14 ENCOUNTER — Encounter: Payer: Self-pay | Admitting: Emergency Medicine

## 2019-10-14 DIAGNOSIS — R519 Headache, unspecified: Secondary | ICD-10-CM | POA: Diagnosis not present

## 2019-10-14 DIAGNOSIS — Z5321 Procedure and treatment not carried out due to patient leaving prior to being seen by health care provider: Secondary | ICD-10-CM | POA: Diagnosis not present

## 2019-10-14 LAB — BASIC METABOLIC PANEL
Anion gap: 7 (ref 5–15)
BUN: 20 mg/dL (ref 8–23)
CO2: 22 mmol/L (ref 22–32)
Calcium: 8.6 mg/dL — ABNORMAL LOW (ref 8.9–10.3)
Chloride: 106 mmol/L (ref 98–111)
Creatinine, Ser: 1.19 mg/dL — ABNORMAL HIGH (ref 0.44–1.00)
GFR calc Af Amer: 54 mL/min — ABNORMAL LOW (ref >60)
GFR calc non Af Amer: 47 mL/min — ABNORMAL LOW (ref >60)
Glucose, Bld: 111 mg/dL — ABNORMAL HIGH (ref 70–99)
Potassium: 3.4 mmol/L — ABNORMAL LOW (ref 3.5–5.1)
Sodium: 135 mmol/L (ref 135–145)

## 2019-10-14 LAB — CBC
HCT: 35.4 % — ABNORMAL LOW (ref 36.0–46.0)
Hemoglobin: 11.5 g/dL — ABNORMAL LOW (ref 12.0–15.0)
MCH: 28.9 pg (ref 26.0–34.0)
MCHC: 32.5 g/dL (ref 30.0–36.0)
MCV: 88.9 fL (ref 80.0–100.0)
Platelets: 239 10*3/uL (ref 150–400)
RBC: 3.98 MIL/uL (ref 3.87–5.11)
RDW: 13.1 % (ref 11.5–15.5)
WBC: 10.4 10*3/uL (ref 4.0–10.5)
nRBC: 0 % (ref 0.0–0.2)

## 2019-10-14 NOTE — ED Triage Notes (Signed)
Pt c/o left sided temporal pain x2 days, worsening this evening. Pt reports pain is behind the left eye and left temple. Pt denies dizziness and blurred vision.

## 2019-10-15 ENCOUNTER — Telehealth: Payer: Self-pay | Admitting: Emergency Medicine

## 2019-10-15 ENCOUNTER — Other Ambulatory Visit: Payer: Self-pay | Admitting: Internal Medicine

## 2019-10-15 ENCOUNTER — Telehealth: Payer: Self-pay | Admitting: Pulmonary Disease

## 2019-10-15 MED ORDER — ADVAIR HFA 115-21 MCG/ACT IN AERO: 2.0000 | INHALATION_SPRAY | Freq: Two times a day (BID) | RESPIRATORY_TRACT | 4 refills | Status: DC

## 2019-10-15 NOTE — Telephone Encounter (Signed)
ATC and did not get an answer. I left her a voicemail to return call.  Dr. Patsey Berthold please advise.

## 2019-10-15 NOTE — Telephone Encounter (Signed)
Pt states that DG gave her a sample of Trelegy 200mg  at her last visit. She states that she went to the ED last night with severe head and eye pain, but she didn't stay at the ED because of the long wait. She is wanting to know if DG needs to lower the dosage on the medication. She would like a call back ASAP. She said that if she doesn't answer to leave her a VM. Cb#: [phone]

## 2019-10-15 NOTE — Telephone Encounter (Signed)
Spoke with pt. States that Dr. Patsey Berthold started her on Trelegy 200. Since starting the medication, pt has been experiencing severe headache, eye pan and increased anxiety. States she had to go to the ER last night due to the headache. I have advised the pt to not take any more of the medication until she hears back from [place]. Dr. Patsey Berthold is not in the office or hospital today.  Aaron Edelman - please advise. Thanks.

## 2019-10-15 NOTE — Telephone Encounter (Signed)
Recommend going back to her Advair.  There is an ingredient in the Trelegy that can occasionally precipitate glaucoma.  She will have to be checked by her ophthalmologist.  Lowering the dosage would not prevent this. Dr. Darnell Level

## 2019-10-15 NOTE — Telephone Encounter (Signed)
10/15/2019  Please stop Trelegy Ellipta I agree with triage as recommendations.  Based off of chart review it looks like the patient was previously managed on Advair.  She can transition back to Advair at this time.  Please schedule the patient for a follow-up in either the [facility] office or the [facility] location if she is willing with either Dr. Patsey Berthold or an APP.  Please also route to Dr. Lia Foyer, FNP

## 2019-10-15 NOTE — Telephone Encounter (Signed)
Called patient due to lwot to inquire about condition and follow up plans.  Says her head is better and she thinks it was a new asthma med that caused it.  Says she talked to the pulmonary dr today.

## 2019-10-15 NOTE — Telephone Encounter (Signed)
Called patient and did not get an answer. As requested I left the patient a detailed voicemail explaining Dr. Domingo Dimes recommendations. I have also sent in the Advair that she was on previously.

## 2019-10-16 NOTE — Telephone Encounter (Signed)
rx ok'd for alprazolam #30 with no refills.

## 2019-10-16 NOTE — Telephone Encounter (Signed)
This was addressed in a previous message by Dr. Patsey Berthold and I made patient aware to go back to Advair and refills were sent in.

## 2019-10-16 NOTE — Telephone Encounter (Signed)
Refill request for xanax, last seen 09-29-19, last filled 07-31-19.  Please advise.

## 2019-10-27 NOTE — Telephone Encounter (Signed)
Telephone documented in different section

## 2019-10-27 NOTE — Telephone Encounter (Signed)
l °

## 2019-10-28 ENCOUNTER — Other Ambulatory Visit: Payer: Self-pay

## 2019-10-28 ENCOUNTER — Ambulatory Visit
Admission: EM | Admit: 2019-10-28 | Discharge: 2019-10-28 | Disposition: A | Discharge status: Home / Self Care | Payer: Medicare Other | Attending: Emergency Medicine | Admitting: Emergency Medicine

## 2019-10-28 ENCOUNTER — Encounter: Payer: Self-pay | Admitting: Emergency Medicine

## 2019-10-28 DIAGNOSIS — R1031 Right lower quadrant pain: Secondary | ICD-10-CM | POA: Diagnosis not present

## 2019-10-28 DIAGNOSIS — R109 Unspecified abdominal pain: Secondary | ICD-10-CM | POA: Diagnosis not present

## 2019-10-28 DIAGNOSIS — R300 Dysuria: Secondary | ICD-10-CM | POA: Insufficient documentation

## 2019-10-28 LAB — POCT URINALYSIS DIP (MANUAL ENTRY)
Bilirubin, UA: NEGATIVE
Glucose, UA: NEGATIVE mg/dL
Ketones, POC UA: NEGATIVE mg/dL
Nitrite, UA: NEGATIVE
Protein Ur, POC: NEGATIVE mg/dL
Spec Grav, UA: 1.015 (ref 1.010–1.025)
Urobilinogen, UA: 0.2 E.U./dL
pH, UA: 6 (ref 5.0–8.0)

## 2019-10-28 NOTE — ED Provider Notes (Signed)
Victoria Hanson    CSN: [id number] Arrival date & time: 10/28/19  1616      History   Chief Complaint Chief Complaint  Patient presents with  . Dysuria  . Flank Pain    HPI Victoria Hanson is a 69 y.o. female.   Patient presents with dysuria, right flank pain, and RLQ abdominal pain x2 days.  She reports the pain is sharp, 9/10, intermittent.  Patient states she thinks she "pulled a muscle" while picking up her dog but she is also concerned that she may have a UTI.  She also reports some diarrhea yesterday but none today.  She denies fever, chills, vomiting, or other symptoms.    The history is provided by the patient.    Past Medical History:  Diagnosis Date  . Anxiety   . Asthma   . Atypical chest pain    a. 2003 Cath: reportedly nl per pt [name]);  b. 04/2015 [facility] Admission, neg troponin.  . Colon cancer ([place]) 1999   a. 1999: Pt had partial colectomy. Did develop lung metastasis. Had right upper lobectomy in 01/2007 then had associated chemotherapy.  Marland Kitchen COPD (chronic obstructive pulmonary disease) ([place])   . DDD (degenerative disc disease), lumbosacral   . Depression   . Echocardiogram abnormal    a. 02/2010 Echo: EF 55-60%, mild LAE, no regional wall motion abnormalities  . Elevated transaminase level    a. ? NASH;  b. 06/2015 - nl LFTs.  . GERD (gastroesophageal reflux disease)   . Hematuria   . Holter monitor, abnormal    a. 12/2009 Holter monitoring: NSR, rare PACs and PVCs.  Marland Kitchen Hypercholesterolemia   . Hyperlipidemia   . Hypertension   . Leaky heart valve    a. Per pt report - no evidence of valvular abnormalities on prior echoes.  . Lesion of skin of Right breast 10/20/2017  . Lung metastases ([place]) 2008   a. S/P resection (lobectomy - right)  . Morbid obesity ([place])   . Nephrolithiasis   . Parkinson disease ([place]) 08/2016   [name]    Patient Active Problem List   Diagnosis Date Noted  . Near syncope 02/13/2019  . Fibromuscular dysplasia ([place])  02/02/2019  . Abdominal bloating 01/04/2019  . Unsteady gait 12/03/2018  . History of UTI 10/08/2018  . Ear fullness, right 10/08/2018  . B12 deficiency 03/11/2018  . Discharge from right nipple 11/10/2017  . Goals of care, counseling/discussion 10/15/2017  . Parkinson's disease ([place]) 07/31/2017  . Chronic obstructive pulmonary disease ([place]) 07/31/2017  . REM behavioral disorder 05/28/2017  . Elevated liver function tests 08/25/2016  . Carotid stenosis 07/19/2016  . Tremor 06/01/2016  . Osteoporosis 04/08/2016  . Foot pain, bilateral 07/24/2015  . Head pain 07/24/2015  . Hyperlipidemia 06/21/2015  . Other fatigue 06/21/2015  . BMI 33.0-33.9,adult 06/21/2015  . Disorder of kidney and ureter 03/28/2015  . Hyperglycemia 03/20/2015  . Swelling of lower extremity 11/20/2014  . Dysuria 10/24/2014  . Health care maintenance 07/17/2014  . Hypokalemia 04/05/2013  . TIA (transient ischemic attack) 02/09/2013  . Temporary cerebral vascular dysfunction 02/09/2013  . Anxiety, mild 03/13/2012  . Depression, recurrent ([place]) 03/13/2012  . History of colon cancer 03/13/2012  . Hypertension 03/13/2012  . CKD (chronic kidney disease), stage III 03/13/2012  . GERD (gastroesophageal reflux disease) 03/13/2012  . Acid reflux 03/13/2012  . History of malignant neoplasm metastatic to lung 03/13/2012  . Major depressive disorder with single episode 03/13/2012  . Hypercholesterolemia 02/02/2010  .  SOB (shortness of breath) 02/02/2010  . Pure hypercholesterolemia 02/02/2010    Past Surgical History:  Procedure Laterality Date  . BREAST EXCISIONAL BIOPSY Right 10/07/2009   benign  . CARDIAC CATHETERIZATION  2003   negative as per pt report  . CHOLECYSTECTOMY  2000  . COLON SURGERY     Colon cancer, removed part of large colon  . COLONOSCOPY N/A 04/25/2015   Procedure: COLONOSCOPY;  Surgeon: Manya Silvas, MD;  Location: [facility] ENDOSCOPY;  Service: Endoscopy;  Laterality: N/A;  . COLONOSCOPY  WITH PROPOFOL N/A 06/14/2017   Procedure: COLONOSCOPY WITH PROPOFOL;  Surgeon: Manya Silvas, MD;  Location: [facility] ENDOSCOPY;  Service: Endoscopy;  Laterality: N/A;  . LOBECTOMY  01/2007   Right lung, upper lobe right side removed  . TOTAL ABDOMINAL HYSTERECTOMY     abnormal bleeding  . TUBAL LIGATION  1980    OB History   No obstetric history on file.      Home Medications    Prior to Admission medications   Medication Sig Start Date End Date Taking? Authorizing Provider  ALPRAZolam Duanne Moron) 0.5 MG tablet TAKE 1/2 TABLET BY MOUTH DAILY AS NEEDED 10/16/19  Yes Einar Pheasant, MD  calcium-vitamin D (OSCAL WITH D) 500-200 MG-UNIT tablet Take 1 tablet by mouth.   Yes [provider]  carbidopa-levodopa (SINEMET) 25-100 MG tablet Take 1.5 in am and one at 11:00 and one in afternoon and 1.5 at night. 06/23/19  Yes Einar Pheasant, MD  citalopram (CELEXA) 10 MG tablet Taking 1 and half tablet daily. 09/14/19  Yes Einar Pheasant, MD  fluticasone-salmeterol (ADVAIR HFA) 676-19 MCG/ACT inhaler Inhale 2 puffs into the lungs 2 (two) times daily. 10/15/19  Yes Tyler Pita, MD  omeprazole (PRILOSEC) 20 MG capsule Take 1 capsule (20 mg total) by mouth 2 (two) times daily before a meal. 06/23/19  Yes Einar Pheasant, MD  PROAIR HFA 108 (90 Base) MCG/ACT inhaler INHALE 2 PUFFS BY MOUTH INTO THE LUNGS EVERY 6 HOURS AS NEEDED FOR WHEEZING 05/11/19  Yes Einar Pheasant, MD  rosuvastatin (CRESTOR) 5 MG tablet TAKE 1 TABLET BY MOUTH ON MONDAY, [facility] AND FRIDAY 08/28/19  Yes Einar Pheasant, MD  traZODone (DESYREL) 50 MG tablet TAKE 2 TABLETS BY MOUTH AT BEDTIME 09/14/19  Yes Einar Pheasant, MD  vitamin B-12 (CYANOCOBALAMIN) 100 MCG tablet Take 100 mcg by mouth daily.   Yes [provider]  aspirin EC 81 MG tablet Take 81 mg by mouth as needed.     [provider]  calcium-vitamin D (OSCAL WITH D) 500-200 MG-UNIT tablet Take 1 tablet by mouth.    [provider]    ferrous sulfate 325 (65 FE) MG tablet Take 325 mg by mouth daily with breakfast.    [provider]    Family History Family History  Problem Relation Age of Onset  . Coronary artery disease Mother        died @ 70 of MI.  . Mental illness Mother   . Diabetes Mother   . Heart attack Father 9       MI  . Cancer Father        lung - died in his mid-70's  . Hypertension Father   . Diabetes Father   . Sudden death Brother   . Mental illness Brother   . Heart attack Brother        MI @ 59, died @ age 2.  . Breast cancer Neg Hx     Social  History Social History   Tobacco Use  . Smoking status: Passive Smoke Exposure - Never Smoker  . Smokeless tobacco: Never Used  . Tobacco comment: husband and son smoked in her home.  Substance Use Topics  . Alcohol use: Yes    Alcohol/week: 1.0 standard drinks    Types: 1 Glasses of wine per week    Comment: rare wine cooler.  . Drug use: No     Allergies   Macrobid [nitrofurantoin macrocrystal]; Antihistamines, diphenhydramine-type; Aspirin; Ceftin [cefuroxime axetil]; Celecoxib; Cymbalta [duloxetine hcl]; Diphenhydramine; Escitalopram; Lexapro [escitalopram oxalate]; Metronidazole; Nitrofurantoin; Zoloft [sertraline hcl]; and Ciprofloxacin   Review of Systems Review of Systems  Constitutional: Negative for chills and fever.  HENT: Negative for ear pain and sore throat.   Eyes: Negative for pain and visual disturbance.  Respiratory: Negative for cough and shortness of breath.   Cardiovascular: Negative for chest pain and palpitations.  Gastrointestinal: Positive for abdominal pain and diarrhea. Negative for nausea and vomiting.  Genitourinary: Positive for dysuria and flank pain. Negative for hematuria.  Musculoskeletal: Negative for arthralgias and back pain.  Skin: Negative for color change and rash.  Neurological: Negative for seizures and syncope.  All other systems reviewed and are negative.    Physical  Exam Triage Vital Signs ED Triage Vitals  Enc Vitals Group     BP      Pulse      Resp      Temp      Temp src      SpO2      Weight      Height      Head Circumference      Peak Flow      Pain Score      Pain Loc      Pain Edu?      Excl. in [place]?    No data found.  Updated Vital Signs BP (!) 154/89 (BP Location: Left Arm)   Pulse 66   Temp 98 F (36.7 C) (Oral)   Resp 20   Ht 5\' 4"  (1.626 m)   Wt 212 lb 15.4 oz (96.6 kg)   SpO2 95%   BMI 36.56 kg/m   Visual Acuity Right Eye Distance:   Left Eye Distance:   Bilateral Distance:    Right Eye Near:   Left Eye Near:    Bilateral Near:     Physical Exam Vitals and nursing note reviewed.  Constitutional:      General: She is not in acute distress.    Appearance: She is well-developed. She is not ill-appearing.  HENT:     Head: Normocephalic and atraumatic.     Mouth/Throat:     Mouth: Mucous membranes are moist.  Eyes:     Conjunctiva/sclera: Conjunctivae normal.  Cardiovascular:     Rate and Rhythm: Normal rate and regular rhythm.     Heart sounds: No murmur.  Pulmonary:     Effort: Pulmonary effort is normal. No respiratory distress.     Breath sounds: Normal breath sounds.  Abdominal:     General: Bowel sounds are normal.     Palpations: Abdomen is soft.     Tenderness: There is no abdominal tenderness. There is no right CVA tenderness, left CVA tenderness, guarding or rebound.  Musculoskeletal:     Cervical back: Neck supple.  Skin:    General: Skin is warm and dry.     Findings: No rash.  Neurological:     General: No focal deficit  present.     Mental Status: She is alert and oriented to person, place, and time.     Gait: Gait normal.  Psychiatric:        Mood and Affect: Mood normal.        Behavior: Behavior normal.      UC Treatments / Results  Labs (all labs ordered are listed, but only abnormal results are displayed) Labs Reviewed  POCT URINALYSIS DIP (MANUAL ENTRY) - Abnormal;  Notable for the following components:      Result Value   Blood, UA trace-intact (*)    Leukocytes, UA Small (1+) (*)    All other components within normal limits  URINE CULTURE    EKG   Radiology No results found.  Procedures Procedures (including critical care time)  Medications Ordered in UC Medications - No data to display  Initial Impression / Assessment and Plan / UC Course  I have reviewed the triage vital signs and the nursing notes.  Pertinent labs & imaging results that were available during my care of the patient were reviewed by me and considered in my medical decision making (see chart for details).   Dysuria. RLQ abdominal pain. Right flank pain.  Patient is afebrile and her exam is unremarkable.  No indication for antibiotic at this time.  Urine culture pending.  Instructed patient to follow-up with her PCP if her symptoms persist.  Discussed with patient that she should go to the emergency department if she has severe abdominal pain or other severe symptoms.  Patient agrees to plan of care.     Final Clinical Impressions(s) / UC Diagnoses   Final diagnoses:  Dysuria  Right lower quadrant abdominal pain  Right flank pain     Discharge Instructions     A urine culture is pending.  We will call you if it comes back positive requiring treatment.    Follow up with our primary care provider if your symptoms persist.    Go to the emergency department if you have severe abdominal pain or other concerning symptoms.        ED Prescriptions    None     PDMP not reviewed this encounter.   Sharion Balloon, NP 10/28/19 1645

## 2019-10-28 NOTE — Discharge Instructions (Signed)
A urine culture is pending.  We will call you if it comes back positive requiring treatment.    Follow up with our primary care provider if your symptoms persist.    Go to the emergency department if you have severe abdominal pain or other concerning symptoms.

## 2019-10-28 NOTE — ED Triage Notes (Addendum)
Pt c/o right sided flank pain, dysuria, RLQ pain. Started about 2 days ago. She denies fever, nausea or vomiting. At the end of triage pt states she thinks she has a sinus infection.

## 2019-10-29 ENCOUNTER — Ambulatory Visit: Payer: Medicare Other | Admitting: Nurse Practitioner

## 2019-10-30 ENCOUNTER — Other Ambulatory Visit
Admission: RE | Admit: 2019-10-30 | Discharge: 2019-10-30 | Disposition: A | Discharge status: Home / Self Care | Payer: Medicare Other | Source: Ambulatory Visit | Attending: Gastroenterology | Admitting: Gastroenterology

## 2019-10-30 DIAGNOSIS — Z01812 Encounter for preprocedural laboratory examination: Secondary | ICD-10-CM | POA: Insufficient documentation

## 2019-10-30 DIAGNOSIS — Z20822 Contact with and (suspected) exposure to covid-19: Secondary | ICD-10-CM | POA: Diagnosis not present

## 2019-10-30 LAB — SARS CORONAVIRUS 2 (TAT 6-24 HRS): SARS Coronavirus 2: NEGATIVE

## 2019-10-30 LAB — URINE CULTURE: Culture: NO GROWTH

## 2019-10-31 IMAGING — CT CT CHEST W/O CM
2 of 4 series · 14 of 36 positions shown, 17 images · non-contrast
Comparison: Chest CT 03/06/2017

CLINICAL DATA: Colon cancer diagnosed 5999 with surgical resection.
Lung metastasis with RIGHT upper lobe lobectomy 9559.

EXAM:
CT CHEST, ABDOMEN AND PELVIS WITHOUT CONTRAST
TECHNIQUE: Multidetector CT imaging of the chest, abdomen and pelvis was
performed following the standard protocol without IV contrast.

[Series 2: axials cap · axial · 0.72mm/px · z∈[-1481,-976]mm · 11 of 121 slices shown, 14 images]
[im 10/121  mediastinal]
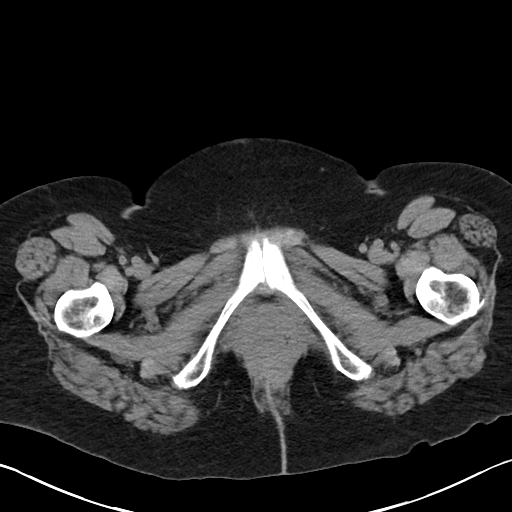
[im 10/121  lung]
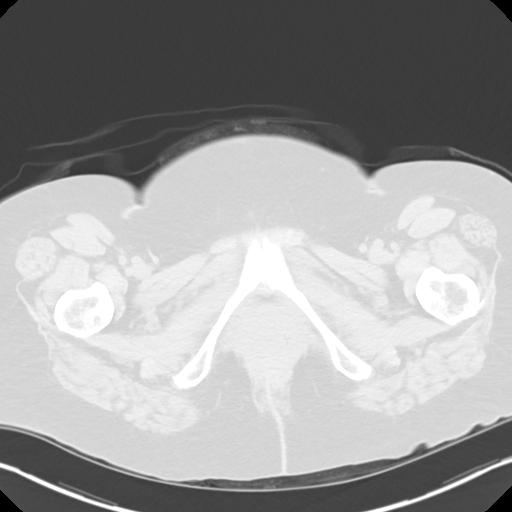
[im 19/121  lung]
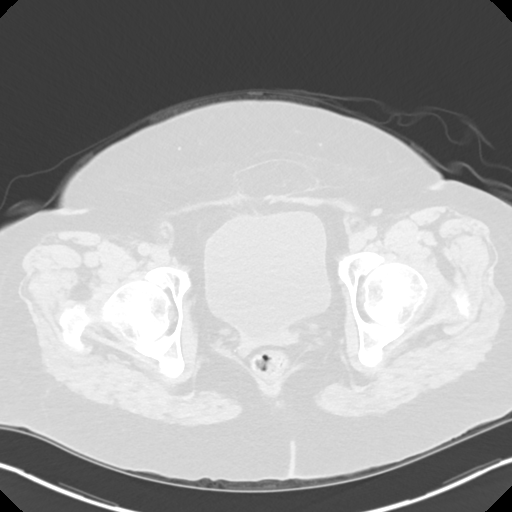
[im 28/121  lung]
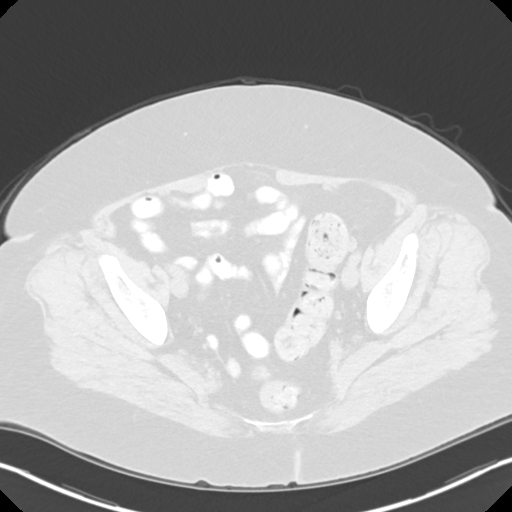
[im 37/121  lung]
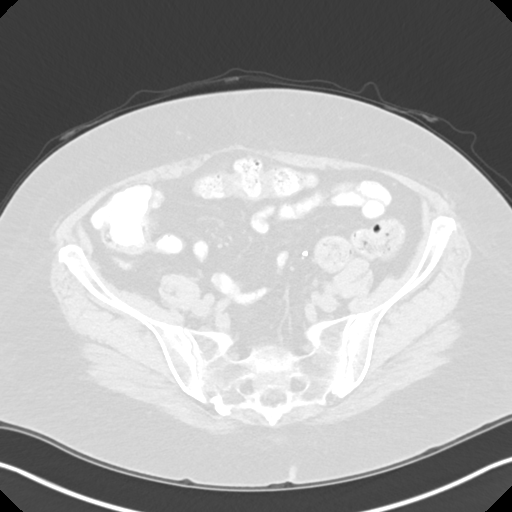
[im 47/121  mediastinal]
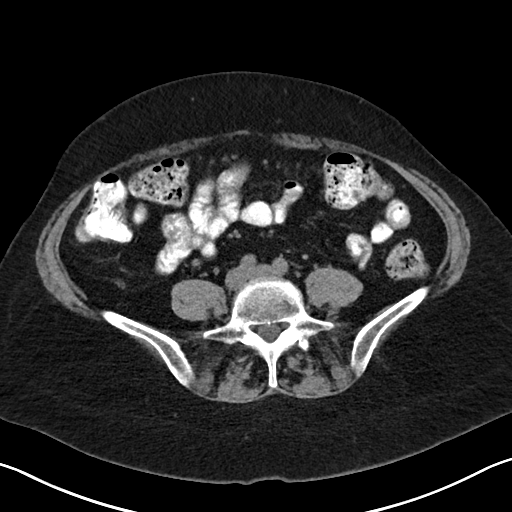
[im 47/121  lung]
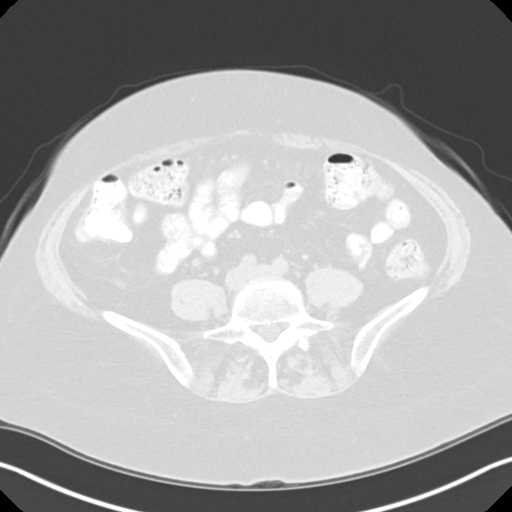
[im 65/121  lung]
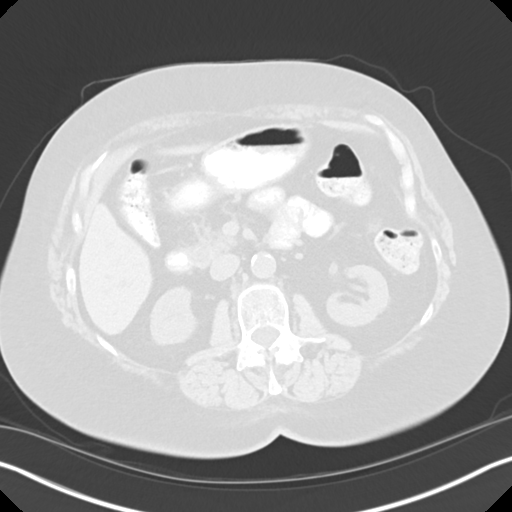
[im 74/121  lung]
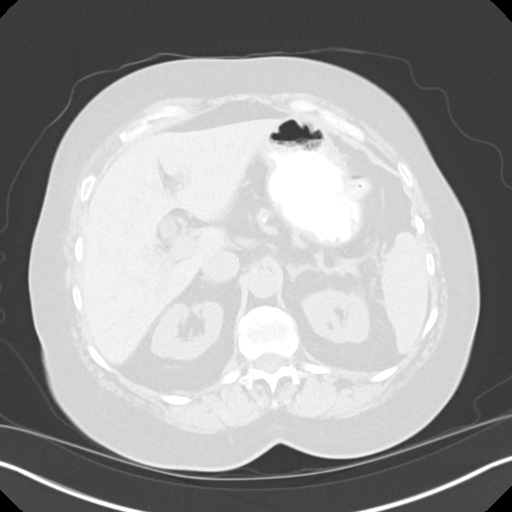
[im 84/121  lung]
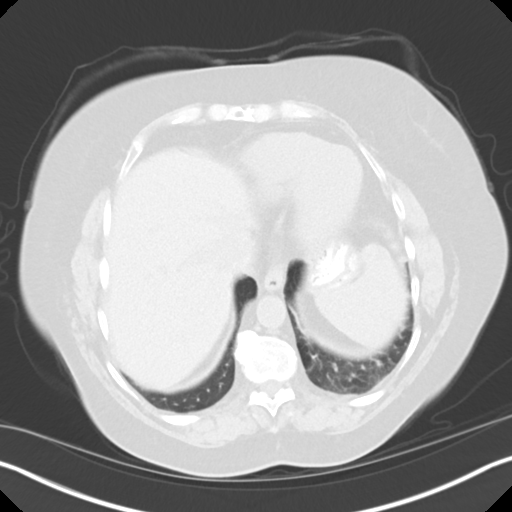
[im 93/121  mediastinal]
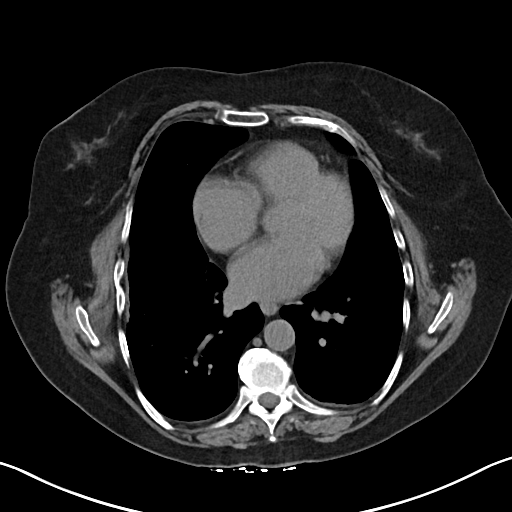
[im 93/121  lung]
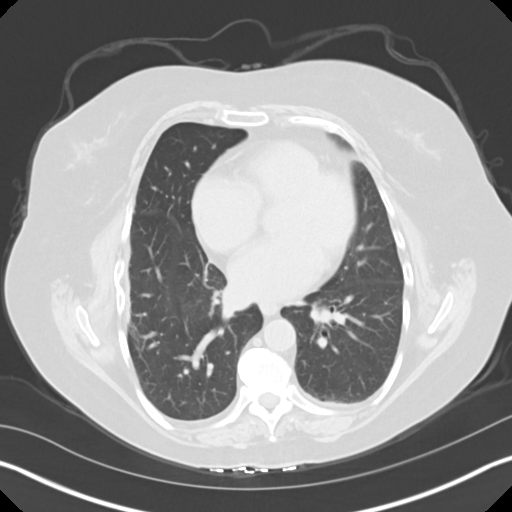
[im 102/121  lung]
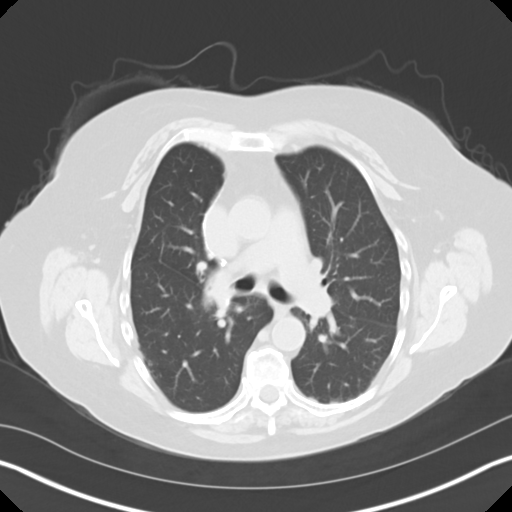
[im 111/121  lung]
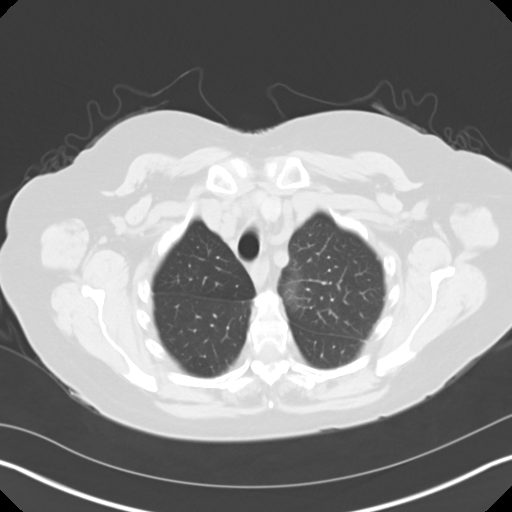

[Series 4: coronals cap · coronal · 0.69mm/px · 3 of 158 slices shown]
[im 32/158  lung]
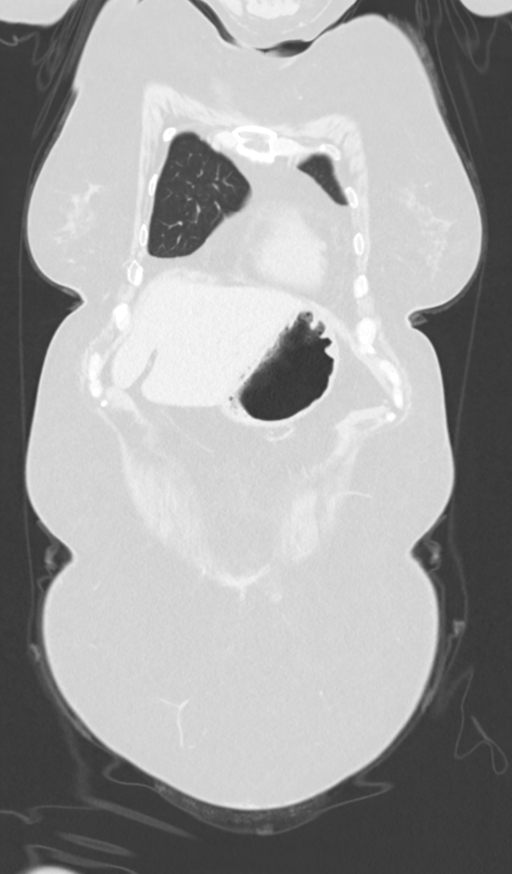
[im 63/158  lung]
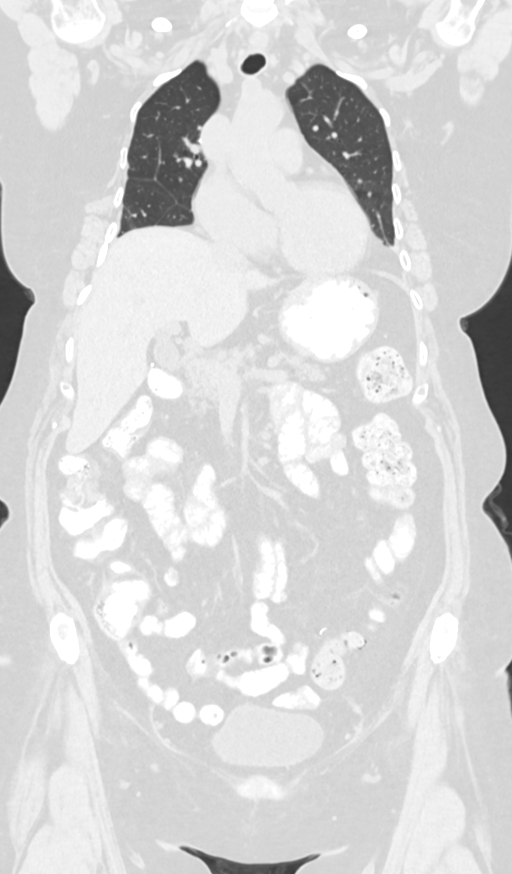
[im 95/158  lung]
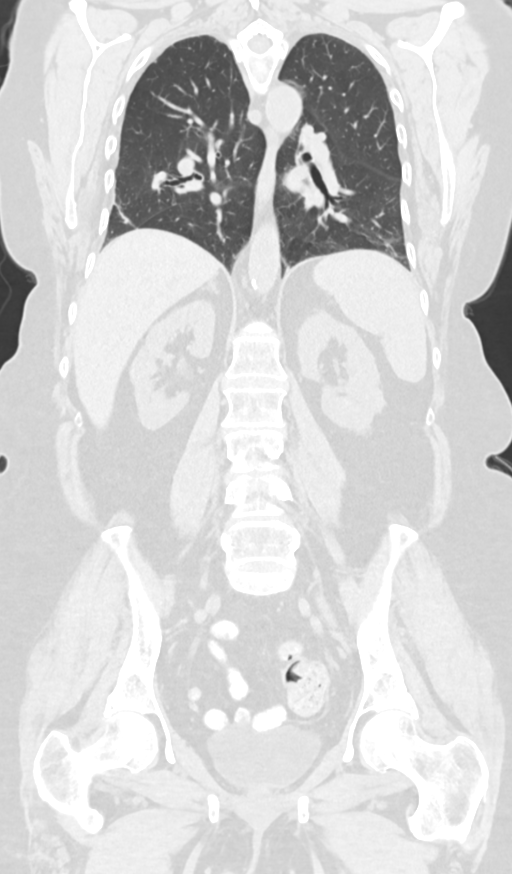

[14 of 36 positions shown; findings below may reference images not displayed]

FINDINGS: CT CHEST FINDINGS

Cardiovascular: No significant vascular findings. Normal heart size.
No pericardial effusion.

Mediastinum/Nodes: No axillary supraclavicular adenopathy. No
mediastinal hilar adenopathy. No pericardial effusion

Lungs/Pleura: No suspicious pulmonary nodules. Mild linear scarring
in the RIGHT lower lobe and mild pleural thickening related to prior
lobectomy.

Musculoskeletal: No aggressive osseous lesion.

CT ABDOMEN AND PELVIS FINDINGS

Hepatobiliary: No focal hepatic lesion. No biliary ductal
dilatation. Common bile duct is normal.

Pancreas: Pancreas is normal. No ductal dilatation. No pancreatic
inflammation.

Spleen: Normal spleen

Adrenals/urinary tract: Adrenal glands and kidneys are normal. The
ureters and bladder normal.

Stomach/Bowel: Stomach, small bowel, appendix, and cecum are normal.
The colon and rectosigmoid colon are normal.

Surgical clips at the proximal sigmoid colon related to prior
surgical resection.

Vascular/Lymphatic: Abdominal aorta is normal caliber. There is no
retroperitoneal or periportal lymphadenopathy. No pelvic
lymphadenopathy.

Reproductive: Post hysterectomy anatomy

Other: No peritoneal disease.

Musculoskeletal: No aggressive osseous lesion.
IMPRESSION: Chest Impression:

1. No evidence of malignancy in thorax.
2. Postsurgical change in the RIGHT upper lobe.

Abdomen / Pelvis Impression:

1. No evidence of metastatic disease in the abdomen pelvis.
2. Post hysterectomy and cholecystectomy.
3.  Aortic Atherosclerosis ([id number].I).

## 2019-11-01 ENCOUNTER — Other Ambulatory Visit: Payer: Self-pay

## 2019-11-01 ENCOUNTER — Emergency Department: Payer: Medicare Other

## 2019-11-01 ENCOUNTER — Encounter: Payer: Self-pay | Admitting: Emergency Medicine

## 2019-11-01 DIAGNOSIS — R079 Chest pain, unspecified: Secondary | ICD-10-CM | POA: Diagnosis not present

## 2019-11-01 DIAGNOSIS — G20 Parkinson's disease: Secondary | ICD-10-CM | POA: Insufficient documentation

## 2019-11-01 DIAGNOSIS — Z7722 Contact with and (suspected) exposure to environmental tobacco smoke (acute) (chronic): Secondary | ICD-10-CM | POA: Insufficient documentation

## 2019-11-01 DIAGNOSIS — Z79899 Other long term (current) drug therapy: Secondary | ICD-10-CM | POA: Insufficient documentation

## 2019-11-01 DIAGNOSIS — J449 Chronic obstructive pulmonary disease, unspecified: Secondary | ICD-10-CM | POA: Diagnosis not present

## 2019-11-01 DIAGNOSIS — I10 Essential (primary) hypertension: Secondary | ICD-10-CM | POA: Diagnosis not present

## 2019-11-01 DIAGNOSIS — R519 Headache, unspecified: Secondary | ICD-10-CM | POA: Diagnosis not present

## 2019-11-01 LAB — CBC
HCT: 35.5 % — ABNORMAL LOW (ref 36.0–46.0)
Hemoglobin: 12 g/dL (ref 12.0–15.0)
MCH: 29.4 pg (ref 26.0–34.0)
MCHC: 33.8 g/dL (ref 30.0–36.0)
MCV: 87 fL (ref 80.0–100.0)
Platelets: 232 10*3/uL (ref 150–400)
RBC: 4.08 MIL/uL (ref 3.87–5.11)
RDW: 13 % (ref 11.5–15.5)
WBC: 10.4 10*3/uL (ref 4.0–10.5)
nRBC: 0 % (ref 0.0–0.2)

## 2019-11-01 NOTE — ED Triage Notes (Signed)
Patient states that she has been hypertensive since yesterday. Patient states that her blood pressure has been 200/100 at home. Patient denies chest pain but feels her heart flutter sometimes. Patient states that she was on blood pressure medication but that her MD took her off of it 6 months ago because it would drop her blood pressure to low.

## 2019-11-02 ENCOUNTER — Emergency Department: Payer: Medicare Other

## 2019-11-02 ENCOUNTER — Emergency Department
Admission: EM | Admit: 2019-11-02 | Discharge: 2019-11-02 | Disposition: A | Discharge status: Home / Self Care | Payer: Medicare Other | Attending: Emergency Medicine | Admitting: Emergency Medicine

## 2019-11-02 DIAGNOSIS — R519 Headache, unspecified: Secondary | ICD-10-CM | POA: Diagnosis not present

## 2019-11-02 DIAGNOSIS — Z85038 Personal history of other malignant neoplasm of large intestine: Secondary | ICD-10-CM

## 2019-11-02 DIAGNOSIS — R079 Chest pain, unspecified: Secondary | ICD-10-CM | POA: Diagnosis not present

## 2019-11-02 DIAGNOSIS — I10 Essential (primary) hypertension: Secondary | ICD-10-CM

## 2019-11-02 LAB — URINALYSIS, COMPLETE (UACMP) WITH MICROSCOPIC
Bacteria, UA: NONE SEEN
Bilirubin Urine: NEGATIVE
Glucose, UA: NEGATIVE mg/dL
Ketones, ur: NEGATIVE mg/dL
Nitrite: NEGATIVE
Protein, ur: NEGATIVE mg/dL
Specific Gravity, Urine: 1.008 (ref 1.005–1.030)
pH: 6 (ref 5.0–8.0)

## 2019-11-02 LAB — BASIC METABOLIC PANEL
Anion gap: 9 (ref 5–15)
BUN: 23 mg/dL (ref 8–23)
CO2: 27 mmol/L (ref 22–32)
Calcium: 9 mg/dL (ref 8.9–10.3)
Chloride: 103 mmol/L (ref 98–111)
Creatinine, Ser: 1.36 mg/dL — ABNORMAL HIGH (ref 0.44–1.00)
GFR calc Af Amer: 46 mL/min — ABNORMAL LOW (ref >60)
GFR calc non Af Amer: 40 mL/min — ABNORMAL LOW (ref >60)
Glucose, Bld: 113 mg/dL — ABNORMAL HIGH (ref 70–99)
Potassium: 3.9 mmol/L (ref 3.5–5.1)
Sodium: 139 mmol/L (ref 135–145)

## 2019-11-02 LAB — TROPONIN I (HIGH SENSITIVITY): Troponin I (High Sensitivity): 5 ng/L (ref <18)

## 2019-11-02 MED ORDER — AMLODIPINE BESYLATE 5 MG PO TABS
5.0000 mg | ORAL_TABLET | Freq: Every day | ORAL | 1 refills | Status: DC
Start: 2019-11-02 — End: 2019-11-11

## 2019-11-02 MED ORDER — AMLODIPINE BESYLATE 5 MG PO TABS
5.0000 mg | ORAL_TABLET | Freq: Once | ORAL | Status: AC
  Administered 2019-11-02: 5 mg via ORAL
  Filled 2019-11-02: qty 1

## 2019-11-02 NOTE — ED Notes (Signed)
No peripheral IV placed this visit.   Discharge instructions reviewed with patient. Questions fielded by this RN. Patient verbalizes understanding of instructions. Patient discharged home in stable condition per [place]. No acute distress noted at time of discharge.   Pt ambulatory to DC without acute distress

## 2019-11-02 NOTE — ED Provider Notes (Signed)
[facility] Emergency Department Provider Note  ____________________________________________  Time seen: Approximately 6:44 AM  I have reviewed the triage vital signs and the nursing notes.   HISTORY  Chief Complaint Hypertension   HPI Victoria Hanson is a 69 y.o. female the history of remote colon cancer, COPD, anxiety, asthma, hypertension, hyperlipidemia, Parkinson's disease who presents for evaluation of elevated blood pressure. Patient reports that she has been feeling unwell for the last 2 days and decided to check her blood pressure. She did it twice and they are both elevated in the 180s. She reports being on blood pressure medication in the past however she was taken off of it several months ago because it was interfering with her Parkinson's medication and her blood pressure was dropping too low. She is unable to fully explain exactly what she is feeling other than unwell. She denies headache, dizziness, lightheadedness, malaise, body aches, fever, cough, chest pain, shortness of breath, vomiting, diarrhea. She has had mild dysuria but was checked 5 days ago for UTI and it was negative. She reports that sometimes she has a little bit of left temporal discomfort, she would not even call this a headache. None at this time. No prior history of stroke, no slurred speech, no facial droop, no unilateral weakness or numbness, no gait instability. Patient thinks that her symptoms are due to elevated blood pressure.   Past Medical History:  Diagnosis Date  . Anxiety   . Asthma   . Atypical chest pain    a. 2003 Cath: reportedly nl per pt [name]);  b. 04/2015 [place] Admission, neg troponin.  . Colon cancer ([place]) 1999   a. 1999: Pt had partial colectomy. Did develop lung metastasis. Had right upper lobectomy in 01/2007 then had associated chemotherapy.  Marland Kitchen COPD (chronic obstructive pulmonary disease) ([place])   . DDD (degenerative disc disease), lumbosacral   .  Depression   . Echocardiogram abnormal    a. 02/2010 Echo: EF 55-60%, mild LAE, no regional wall motion abnormalities  . Elevated transaminase level    a. ? NASH;  b. 06/2015 - nl LFTs.  . GERD (gastroesophageal reflux disease)   . Hematuria   . Holter monitor, abnormal    a. 12/2009 Holter monitoring: NSR, rare PACs and PVCs.  Marland Kitchen Hypercholesterolemia   . Hyperlipidemia   . Hypertension   . Leaky heart valve    a. Per pt report - no evidence of valvular abnormalities on prior echoes.  . Lesion of skin of Right breast 10/20/2017  . Lung metastases ([place]) 2008   a. S/P resection (lobectomy - right)  . Morbid obesity ([place])   . Nephrolithiasis   . Parkinson disease ([place]) 08/2016   [name]    Patient Active Problem List   Diagnosis Date Noted  . Near syncope 02/13/2019  . Fibromuscular dysplasia ([place]) 02/02/2019  . Abdominal bloating 01/04/2019  . Unsteady gait 12/03/2018  . History of UTI 10/08/2018  . Ear fullness, right 10/08/2018  . B12 deficiency 03/11/2018  . Discharge from right nipple 11/10/2017  . Goals of care, counseling/discussion 10/15/2017  . Parkinson's disease ([place]) 07/31/2017  . Chronic obstructive pulmonary disease ([place]) 07/31/2017  . REM behavioral disorder 05/28/2017  . Elevated liver function tests 08/25/2016  . Carotid stenosis 07/19/2016  . Tremor 06/01/2016  . Osteoporosis 04/08/2016  . Foot pain, bilateral 07/24/2015  . Head pain 07/24/2015  . Hyperlipidemia 06/21/2015  . Other fatigue 06/21/2015  . BMI 33.0-33.9,adult 06/21/2015  . Disorder of  kidney and ureter 03/28/2015  . Hyperglycemia 03/20/2015  . Swelling of lower extremity 11/20/2014  . Dysuria 10/24/2014  . Health care maintenance 07/17/2014  . Hypokalemia 04/05/2013  . TIA (transient ischemic attack) 02/09/2013  . Temporary cerebral vascular dysfunction 02/09/2013  . Anxiety, mild 03/13/2012  . Depression, recurrent ([place]) 03/13/2012  . History of colon cancer 03/13/2012  . Hypertension  03/13/2012  . CKD (chronic kidney disease), stage III 03/13/2012  . GERD (gastroesophageal reflux disease) 03/13/2012  . Acid reflux 03/13/2012  . History of malignant neoplasm metastatic to lung 03/13/2012  . Major depressive disorder with single episode 03/13/2012  . Hypercholesterolemia 02/02/2010  . SOB (shortness of breath) 02/02/2010  . Pure hypercholesterolemia 02/02/2010    Past Surgical History:  Procedure Laterality Date  . BREAST EXCISIONAL BIOPSY Right 10/07/2009   benign  . CARDIAC CATHETERIZATION  2003   negative as per pt report  . CHOLECYSTECTOMY  2000  . COLON SURGERY     Colon cancer, removed part of large colon  . COLONOSCOPY N/A 04/25/2015   Procedure: COLONOSCOPY;  Surgeon: Manya Silvas, MD;  Location: [facility] ENDOSCOPY;  Service: Endoscopy;  Laterality: N/A;  . COLONOSCOPY WITH PROPOFOL N/A 06/14/2017   Procedure: COLONOSCOPY WITH PROPOFOL;  Surgeon: Manya Silvas, MD;  Location: [facility] ENDOSCOPY;  Service: Endoscopy;  Laterality: N/A;  . LOBECTOMY  01/2007   Right lung, upper lobe right side removed  . TOTAL ABDOMINAL HYSTERECTOMY     abnormal bleeding  . TUBAL LIGATION  1980    Prior to Admission medications   Medication Sig Start Date End Date Taking? Authorizing Provider  ALPRAZolam Duanne Moron) 0.5 MG tablet TAKE 1/2 TABLET BY MOUTH DAILY AS NEEDED 10/16/19   Einar Pheasant, MD  amLODipine (NORVASC) 5 MG tablet Take 1 tablet (5 mg total) by mouth daily. 11/02/19 11/01/20  Rudene Re, MD  aspirin EC 81 MG tablet Take 81 mg by mouth as needed.     [provider]  calcium-vitamin D (OSCAL WITH D) 500-200 MG-UNIT tablet Take 1 tablet by mouth.    [provider]  calcium-vitamin D (OSCAL WITH D) 500-200 MG-UNIT tablet Take 1 tablet by mouth.    [provider]  carbidopa-levodopa (SINEMET) 25-100 MG tablet Take 1.5 in am and one at 11:00 and one in afternoon and 1.5 at night. 06/23/19   Einar Pheasant, MD  citalopram (CELEXA)  10 MG tablet Taking 1 and half tablet daily. 09/14/19   Einar Pheasant, MD  ferrous sulfate 325 (65 FE) MG tablet Take 325 mg by mouth daily with breakfast.    [provider]  fluticasone-salmeterol (ADVAIR HFA) 115-21 MCG/ACT inhaler Inhale 2 puffs into the lungs 2 (two) times daily. 10/15/19   Tyler Pita, MD  omeprazole (PRILOSEC) 20 MG capsule Take 1 capsule (20 mg total) by mouth 2 (two) times daily before a meal. 06/23/19   Einar Pheasant, MD  PROAIR HFA 108 (90 Base) MCG/ACT inhaler INHALE 2 PUFFS BY MOUTH INTO THE LUNGS EVERY 6 HOURS AS NEEDED FOR WHEEZING 05/11/19   Einar Pheasant, MD  rosuvastatin (CRESTOR) 5 MG tablet TAKE 1 TABLET BY MOUTH ON MONDAY, [facility] AND FRIDAY 08/28/19   Einar Pheasant, MD  traZODone (DESYREL) 50 MG tablet TAKE 2 TABLETS BY MOUTH AT BEDTIME 09/14/19   Einar Pheasant, MD  vitamin B-12 (CYANOCOBALAMIN) 100 MCG tablet Take 100 mcg by mouth daily.    [provider]    Allergies Macrobid [nitrofurantoin macrocrystal]; Antihistamines, diphenhydramine-type; Aspirin; Ceftin [cefuroxime  axetil]; Celecoxib; Cymbalta [duloxetine hcl]; Diphenhydramine; Escitalopram; Lexapro [escitalopram oxalate]; Metronidazole; Nitrofurantoin; Zoloft [sertraline hcl]; and Ciprofloxacin  Family History  Problem Relation Age of Onset  . Coronary artery disease Mother        died @ 94 of MI.  . Mental illness Mother   . Diabetes Mother   . Heart attack Father 41       MI  . Cancer Father        lung - died in his mid-70's  . Hypertension Father   . Diabetes Father   . Sudden death Brother   . Mental illness Brother   . Heart attack Brother        MI @ 15, died @ age 44.  . Breast cancer Neg Hx     Social History Social History   Tobacco Use  . Smoking status: Passive Smoke Exposure - Never Smoker  . Smokeless tobacco: Never Used  . Tobacco comment: husband and son smoked in her home.  Substance Use Topics  . Alcohol use: Yes    Alcohol/week:  1.0 standard drinks    Types: 1 Glasses of wine per week    Comment: rare wine cooler.  . Drug use: No    Review of Systems  Constitutional: Negative for fever. + feeling unwell Eyes: Negative for visual changes. ENT: Negative for sore throat. Neck: No neck pain  Cardiovascular: Negative for chest pain. Respiratory: Negative for shortness of breath. Gastrointestinal: Negative for abdominal pain, vomiting or diarrhea. Genitourinary: Negative for dysuria. Musculoskeletal: Negative for back pain. Skin: Negative for rash. Neurological: Negative for headaches, weakness or numbness. Psych: No SI or HI  ____________________________________________   PHYSICAL EXAM:  VITAL SIGNS: ED Triage Vitals  Enc Vitals Group     BP 11/01/19 2314 (!) 190/77     Pulse Rate 11/01/19 2314 66     Resp 11/01/19 2314 20     Temp 11/01/19 2314 (!) 97.5 F (36.4 C)     Temp Source 11/01/19 2314 Oral     SpO2 11/01/19 2314 98 %     Weight 11/01/19 2314 214 lb (97.1 kg)     Height 11/01/19 2314 5\' 4"  (1.626 m)     Head Circumference --      Peak Flow --      Pain Score 11/01/19 2325 0     Pain Loc --      Pain Edu? --      Excl. in [place]? --     Constitutional: Alert and oriented. Well appearing and in no apparent distress. HEENT:      Head: Normocephalic and atraumatic.         Eyes: Conjunctivae are normal. Sclera is non-icteric. EOMI, PERRL      Mouth/Throat: Mucous membranes are moist.       Neck: Supple with no signs of meningismus. Cardiovascular: Regular rate and rhythm. No murmurs, gallops, or rubs.  Respiratory: Normal respiratory effort. Lungs are clear to auscultation bilaterally. No wheezes, crackles, or rhonchi.  Gastrointestinal: Soft, non tender. Musculoskeletal:  No edema, cyanosis, or erythema of extremities. Neurologic: Normal speech and language. Face is symmetric. Intact strength and sensation x4, normal gait, no dysmetria, no pronator drift.  Skin: Skin is warm, dry and  intact. No rash noted. Psychiatric: Mood and affect are normal. Speech and behavior are normal.  ____________________________________________   LABS (all labs ordered are listed, but only abnormal results are displayed)  Labs Reviewed  BASIC METABOLIC PANEL - Abnormal; Notable  for the following components:      Result Value   Glucose, Bld 113 (*)    Creatinine, Ser 1.36 (*)    GFR calc non Af Amer 40 (*)    GFR calc Af Amer 46 (*)    All other components within normal limits  CBC - Abnormal; Notable for the following components:   HCT 35.5 (*)    All other components within normal limits  URINALYSIS, COMPLETE (UACMP) WITH MICROSCOPIC - Abnormal; Notable for the following components:   Color, Urine STRAW (*)    APPearance CLEAR (*)    Hgb urine dipstick SMALL (*)    Leukocytes,Ua TRACE (*)    All other components within normal limits  TROPONIN I (HIGH SENSITIVITY)  TROPONIN I (HIGH SENSITIVITY)   ____________________________________________  EKG  ED ECG REPORT I, Rudene Re, the attending physician, personally viewed and interpreted this ECG.  Normal sinus rhythm, LVH, rate of 64, normal intervals, normal axis, diffuse T wave flattening with no ST elevations or depressions. No significant changes when compared to prior. ____________________________________________  RADIOLOGY  I have personally reviewed the images performed during this visit and I agree with the Radiologist's read.   Interpretation by Radiologist:  DG Chest 2 View  Result Date: 11/02/2019 CLINICAL DATA:  Chest pain and hypertension EXAM: CHEST - 2 VIEW COMPARISON:  Radiograph 02/13/2019, CT 11/27/2018 FINDINGS: Redemonstration of several remote right rib deformities with associated chronic pleural thickening similar to comparison radiographs and CT. Mild hyperinflation of the lungs with increased AP diameter to chest and flattening of the diaphragms. No focal consolidative opacity. No pneumothorax.  Blunting of the costophrenic sulci similar to priors likely reflecting scarring. No visible effusion. Cardiomediastinal contours are stable from prior. No acute osseous or soft tissue abnormality. IMPRESSION: 1. No acute cardiopulmonary disease. 2. Remote right rib deformities with associated chronic pleural thickening. 3. Stable hyperinflation, compatible with history of COPD. Electronically Signed   By: Lovena Le M.D.   On: 11/02/2019 01:09   CT Head Wo Contrast  Result Date: 11/02/2019 CLINICAL DATA:  Headache, hypertension EXAM: CT HEAD WITHOUT CONTRAST TECHNIQUE: Contiguous axial images were obtained from the base of the skull through the vertex without intravenous contrast. COMPARISON:  CT 06/22/2018 FINDINGS: Brain: No evidence of acute infarction, hemorrhage, hydrocephalus, extra-axial collection or mass lesion/mass effect. Symmetric prominence of the ventricles, cisterns and sulci compatible with frontal predominant parenchymal volume loss. Patchy areas of white matter hypoattenuation are most compatible with chronic microvascular angiopathy. Stable dural calcifications. Vascular: Atherosclerotic calcification of the carotid siphons. No hyperdense vessel. Skull: No calvarial fracture or suspicious osseous lesion. No scalp swelling or hematoma. Sinuses/Orbits: Paranasal sinuses and mastoid air cells are predominantly clear. Included orbital structures are unremarkable. Other: None IMPRESSION: 1. No acute intracranial abnormality. 2. Stable frontal predominant parenchymal volume loss and chronic microvascular angiopathy. Electronically Signed   By: Lovena Le M.D.   On: 11/02/2019 06:30     ____________________________________________   PROCEDURES  Procedure(s) performed:yes .1-3 Lead EKG Interpretation Performed by: Rudene Re, MD Authorized by: Rudene Re, MD     Interpretation: normal     ECG rate assessment: normal     Rhythm: sinus rhythm     Ectopy: none      Conduction: normal     Critical Care performed:  None ____________________________________________   INITIAL IMPRESSION / ASSESSMENT AND PLAN / ED COURSE   69 y.o. female the history of remote colon cancer, COPD, anxiety, asthma, hypertension, hyperlipidemia, Parkinson's disease  who presents for evaluation of elevated blood pressure. Review of old medical record shows that patient has had blood pressure in the 180s and 190s for the last two visits to the hospital in the last 3 weeks. She has no signs of stroke and is completely neurologically intact. EKG with no acute ischemic changes or dysrhythmias. Labs showing no endorgan injury, no anemia, no leukocytosis, no significant lecture light abnormalities and stable creatinine. High-sensitivity troponin negative. Since patient was complaining of urinary symptoms a urinalysis was done which is negative for UTI. Since patient was complaining of intermittent head discomfort and elevated blood pressure a CT head was done which showed no acute abnormalities, confirmed by radiology. Will start patient on amlodipine for elevated blood pressure, discussed dietary changes, blood pressure diary, and close follow-up with PCP. Discussed my standard return precautions. History taken from patient and her daughters at bedside. Old medical records reviewed. Patient is stable for discharge home.        _____________________________________________ Please note:  Patient was evaluated in Emergency Department today for the symptoms described in the history of present illness. Patient was evaluated in the context of the global COVID-19 pandemic, which necessitated consideration that the patient might be at risk for infection with the SARS-CoV-2 virus that causes COVID-19. Institutional protocols and algorithms that pertain to the evaluation of patients at risk for COVID-19 are in a state of rapid change based on information released by regulatory bodies including the CDC  and federal and state organizations. These policies and algorithms were followed during the patient's care in the ED.  Some ED evaluations and interventions may be delayed as a result of limited staffing during the pandemic.   [place] Controlled Substance Database was reviewed by me. ____________________________________________   FINAL CLINICAL IMPRESSION(S) / ED DIAGNOSES   Final diagnoses:  Essential hypertension      NEW MEDICATIONS STARTED DURING THIS VISIT:  ED Discharge Orders         Ordered    amLODipine (NORVASC) 5 MG tablet  Daily     11/02/19 0645           Note:  This document was prepared using Dragon voice recognition software and may include unintentional dictation errors.    Alfred Levins, [place], MD 11/02/19 [phone]

## 2019-11-03 ENCOUNTER — Telehealth: Payer: Self-pay | Admitting: Internal Medicine

## 2019-11-03 NOTE — Telephone Encounter (Signed)
Pt states that the hospital put her on a blood pressure medication Norvasc and now her bp is too low. Pt is dizzy and did not want to be triaged.

## 2019-11-03 NOTE — Telephone Encounter (Signed)
Yesterday BP was 200/100, after taking BP medication with parkinson's medication drops her BP. BP was 80's/50's this morning. BP is getting worse but she cant take both together. She doesn't know what else to do. She stated she has appointment next week. She wanted to let PCP know.

## 2019-11-03 NOTE — Telephone Encounter (Signed)
Patient stated she was eating salt and chips to bring her BP back up from this morning 80/56. BP is currently 152/77.

## 2019-11-03 NOTE — Telephone Encounter (Signed)
What is her blood pressure today.  If low, have her hold the amlodipine.  Check her blood pressure and call in reading.  Also, if she feels is an issue with her parkinsons medication, then she needs to speak with neurology as well.

## 2019-11-03 NOTE — Telephone Encounter (Signed)
She was seen yesterday and started on amlodipine 5mg  q day.  What is her blood pressure.  Any other symptoms.  May need to hold or adjust dose, but need more information.

## 2019-11-04 NOTE — Telephone Encounter (Signed)
Left message to return call 

## 2019-11-04 NOTE — Telephone Encounter (Signed)
Do you mind following up with this pt.  I do not want her eating an increased amount of salt.  See previous phone messages.  If having any concerns regarding her parkinsons medication, recommend discussing with neurology.  How is blood pressure today.  May need to decrease amlodipine to 1/2 tablet per day.

## 2019-11-07 ENCOUNTER — Other Ambulatory Visit: Payer: Self-pay | Admitting: Internal Medicine

## 2019-11-10 DIAGNOSIS — R413 Other amnesia: Secondary | ICD-10-CM | POA: Diagnosis not present

## 2019-11-10 DIAGNOSIS — G479 Sleep disorder, unspecified: Secondary | ICD-10-CM | POA: Diagnosis not present

## 2019-11-10 DIAGNOSIS — G20 Parkinson's disease: Secondary | ICD-10-CM | POA: Diagnosis not present

## 2019-11-10 DIAGNOSIS — G4752 REM sleep behavior disorder: Secondary | ICD-10-CM | POA: Diagnosis not present

## 2019-11-11 ENCOUNTER — Ambulatory Visit (INDEPENDENT_AMBULATORY_CARE_PROVIDER_SITE_OTHER): Payer: Medicare Other

## 2019-11-11 ENCOUNTER — Other Ambulatory Visit: Payer: Self-pay

## 2019-11-11 ENCOUNTER — Ambulatory Visit (INDEPENDENT_AMBULATORY_CARE_PROVIDER_SITE_OTHER): Payer: Medicare Other | Admitting: Internal Medicine

## 2019-11-11 VITALS — BP 140/76 | HR 87 | Temp 98.0°F | Resp 16 | Ht 64.0 in | Wt 210.0 lb

## 2019-11-11 DIAGNOSIS — D649 Anemia, unspecified: Secondary | ICD-10-CM | POA: Diagnosis not present

## 2019-11-11 DIAGNOSIS — K219 Gastro-esophageal reflux disease without esophagitis: Secondary | ICD-10-CM

## 2019-11-11 DIAGNOSIS — Z85038 Personal history of other malignant neoplasm of large intestine: Secondary | ICD-10-CM

## 2019-11-11 DIAGNOSIS — R109 Unspecified abdominal pain: Secondary | ICD-10-CM | POA: Diagnosis not present

## 2019-11-11 DIAGNOSIS — M7989 Other specified soft tissue disorders: Secondary | ICD-10-CM

## 2019-11-11 DIAGNOSIS — N183 Chronic kidney disease, stage 3 unspecified: Secondary | ICD-10-CM

## 2019-11-11 DIAGNOSIS — F339 Major depressive disorder, recurrent, unspecified: Secondary | ICD-10-CM

## 2019-11-11 DIAGNOSIS — Z8589 Personal history of malignant neoplasm of other organs and systems: Secondary | ICD-10-CM

## 2019-11-11 DIAGNOSIS — G20 Parkinson's disease: Secondary | ICD-10-CM

## 2019-11-11 DIAGNOSIS — E7800 Pure hypercholesterolemia, unspecified: Secondary | ICD-10-CM

## 2019-11-11 DIAGNOSIS — I10 Essential (primary) hypertension: Secondary | ICD-10-CM | POA: Diagnosis not present

## 2019-11-11 DIAGNOSIS — R7989 Other specified abnormal findings of blood chemistry: Secondary | ICD-10-CM

## 2019-11-11 DIAGNOSIS — K5900 Constipation, unspecified: Secondary | ICD-10-CM

## 2019-11-11 DIAGNOSIS — R739 Hyperglycemia, unspecified: Secondary | ICD-10-CM

## 2019-11-11 DIAGNOSIS — Z85118 Personal history of other malignant neoplasm of bronchus and lung: Secondary | ICD-10-CM

## 2019-11-11 DIAGNOSIS — J449 Chronic obstructive pulmonary disease, unspecified: Secondary | ICD-10-CM

## 2019-11-11 LAB — CBC WITH DIFFERENTIAL/PLATELET
Basophils Absolute: 0.1 10*3/uL (ref 0.0–0.1)
Basophils Relative: 0.9 % (ref 0.0–3.0)
Eosinophils Absolute: 0.2 10*3/uL (ref 0.0–0.7)
Eosinophils Relative: 1.6 % (ref 0.0–5.0)
HCT: 36 % (ref 36.0–46.0)
Hemoglobin: 12 g/dL (ref 12.0–15.0)
Lymphocytes Relative: 23.5 % (ref 12.0–46.0)
Lymphs Abs: 2.2 10*3/uL (ref 0.7–4.0)
MCHC: 33.2 g/dL (ref 30.0–36.0)
MCV: 87.9 fl (ref 78.0–100.0)
Monocytes Absolute: 0.7 10*3/uL (ref 0.1–1.0)
Monocytes Relative: 7 % (ref 3.0–12.0)
Neutro Abs: 6.4 10*3/uL (ref 1.4–7.7)
Neutrophils Relative %: 67 % (ref 43.0–77.0)
Platelets: 247 10*3/uL (ref 150.0–400.0)
RBC: 4.09 Mil/uL (ref 3.87–5.11)
RDW: 13.7 % (ref 11.5–15.5)
WBC: 9.5 10*3/uL (ref 4.0–10.5)

## 2019-11-11 LAB — BASIC METABOLIC PANEL
BUN: 15 mg/dL (ref 6–23)
CO2: 28 mEq/L (ref 19–32)
Calcium: 9.1 mg/dL (ref 8.4–10.5)
Chloride: 106 mEq/L (ref 96–112)
Creatinine, Ser: 1.15 mg/dL (ref 0.40–1.20)
GFR: 46.65 mL/min — ABNORMAL LOW (ref >60.00)
Glucose, Bld: 93 mg/dL (ref 70–99)
Potassium: 3.9 mEq/L (ref 3.5–5.1)
Sodium: 139 mEq/L (ref 135–145)

## 2019-11-11 LAB — IBC + FERRITIN
Ferritin: 35.3 ng/mL (ref 10.0–291.0)
Iron: 62 ug/dL (ref 42–145)
Saturation Ratios: 20.1 % (ref 20.0–50.0)
Transferrin: 220 mg/dL (ref 212.0–360.0)

## 2019-11-11 LAB — TSH: TSH: 2.26 u[IU]/mL (ref 0.35–4.50)

## 2019-11-11 NOTE — Telephone Encounter (Signed)
Patient coming into office today for appt.

## 2019-11-11 NOTE — Progress Notes (Signed)
Patient ID: Victoria Hanson, female   DOB: 1950-07-20, 69 y.o.   MRN: [id number]   Subjective:    Patient ID: Victoria Hanson, female    DOB: 09/10/1950, 69 y.o.   MRN: [id number]  HPI This visit occurred during the SARS-CoV-2 public health emergency.  Safety protocols were in place, including screening questions prior to the visit, additional usage of staff PPE, and extensive cleaning of exam room while observing appropriate contact time as indicated for disinfecting solutions.  Patient here for a scheduled follow up.  Has a history of COPD, asthma, parkinsons, colon cancer and hypertension. Was seen in ER 11/02/19 for elevated blood pressure.  CT head - no acute abnormality.  Urine negative for infection.  Started on amlodipine '5mg'$  q day.  Discharged home.  Called in with low blood pressure and then varying blood pressures.  She was instructed to take 2.5 mg amlodipine q day.  Just started yesterday.  No significant headache or dizziness.  No chest pain.  Breathing stable.  Saw neurology.  They are in the process of adjusting some of her medication pending blood pressure response and treatment.  She is planning for colonoscopy next week. Does reports some issues with her bowels.  Feels full.  Also discussed leg elevation if swelling.    Past Medical History:  Diagnosis Date  . Anxiety   . Asthma   . Atypical chest pain    a. 2003 Cath: reportedly nl per pt [name]);  b. 04/2015 [place] Admission, neg troponin.  . Colon cancer ([place]) 1999   a. 1999: Pt had partial colectomy. Did develop lung metastasis. Had right upper lobectomy in 01/2007 then had associated chemotherapy.  Marland Kitchen COPD (chronic obstructive pulmonary disease) ([place])   . DDD (degenerative disc disease), lumbosacral   . Depression   . Echocardiogram abnormal    a. 02/2010 Echo: EF 55-60%, mild LAE, no regional wall motion abnormalities  . Elevated transaminase level    a. ? NASH;  b. 06/2015 - nl LFTs.  . GERD (gastroesophageal reflux  disease)   . Hematuria   . Holter monitor, abnormal    a. 12/2009 Holter monitoring: NSR, rare PACs and PVCs.  Marland Kitchen Hypercholesterolemia   . Hyperlipidemia   . Hypertension   . Leaky heart valve    a. Per pt report - no evidence of valvular abnormalities on prior echoes.  . Lesion of skin of Right breast 10/20/2017  . Lung metastases ([place]) 2008   a. S/P resection (lobectomy - right)  . Morbid obesity ([place])   . Nephrolithiasis   . Parkinson disease ([place]) 08/2016   [name]   Past Surgical History:  Procedure Laterality Date  . BREAST EXCISIONAL BIOPSY Right 10/07/2009   benign  . CARDIAC CATHETERIZATION  2003   negative as per pt report  . CHOLECYSTECTOMY  2000  . COLON SURGERY     Colon cancer, removed part of large colon  . COLONOSCOPY N/A 04/25/2015   Procedure: COLONOSCOPY;  Surgeon: Manya Silvas, MD;  Location: [facility] ENDOSCOPY;  Service: Endoscopy;  Laterality: N/A;  . COLONOSCOPY WITH PROPOFOL N/A 06/14/2017   Procedure: COLONOSCOPY WITH PROPOFOL;  Surgeon: Manya Silvas, MD;  Location: [facility] ENDOSCOPY;  Service: Endoscopy;  Laterality: N/A;  . LOBECTOMY  01/2007   Right lung, upper lobe right side removed  . TOTAL ABDOMINAL HYSTERECTOMY     abnormal bleeding  . TUBAL LIGATION  1980   Family History  Problem Relation Age of Onset  . Coronary  artery disease Mother        died @ 33 of MI.  . Mental illness Mother   . Diabetes Mother   . Heart attack Father 74       MI  . Cancer Father        lung - died in his mid-70's  . Hypertension Father   . Diabetes Father   . Sudden death Brother   . Mental illness Brother   . Heart attack Brother        MI @ 46, died @ age 43.  . Breast cancer Neg Hx    Social History   Socioeconomic History  . Marital status: Widowed    Spouse name: Not on file  . Number of children: Not on file  . Years of education: Not on file  . Highest education level: Not on file  Occupational History  . Not on file  Tobacco Use  .  Smoking status: Passive Smoke Exposure - Never Smoker  . Smokeless tobacco: Never Used  . Tobacco comment: husband and son smoked in her home.  Substance and Sexual Activity  . Alcohol use: Yes    Alcohol/week: 1.0 standard drinks    Types: 1 Glasses of wine per week    Comment: rare wine cooler.  . Drug use: No  . Sexual activity: Never  Other Topics Concern  . Not on file  Social History Narrative   Widowed.   Disabled (colon/lung CA), retired.   Lives in [place] with her dtr, grand-dtr, and son.   Active.   Gets regular exercise, walks.   Social Determinants of Health   Financial Resource Strain: Low Risk   . Difficulty of Paying Living Expenses: Not hard at all  Food Insecurity: No Food Insecurity  . Worried About Charity fundraiser in the Last Year: Never true  . Ran Out of Food in the Last Year: Never true  Transportation Needs: No Transportation Needs  . Lack of Transportation (Medical): No  . Lack of Transportation (Non-Medical): No  Physical Activity:   . Days of Exercise per Week:   . Minutes of Exercise per Session:   Stress:   . Feeling of Stress :   Social Connections:   . Frequency of Communication with Friends and Family:   . Frequency of Social Gatherings with Friends and Family:   . Attends Religious Services:   . Active Member of Clubs or Organizations:   . Attends Archivist Meetings:   Marland Kitchen Marital Status:     Outpatient Encounter Medications as of 11/11/2019  Medication Sig  . ALPRAZolam (XANAX) 0.5 MG tablet TAKE 1/2 TABLET BY MOUTH DAILY AS NEEDED  . amLODipine (NORVASC) 2.5 MG tablet Take 2.5 mg by mouth daily.  Marland Kitchen aspirin EC 81 MG tablet Take 81 mg by mouth as needed.   . calcium-vitamin D (OSCAL WITH D) 500-200 MG-UNIT tablet Take 1 tablet by mouth.  . carbidopa-levodopa (SINEMET) 25-100 MG tablet Take 1.5 in am and one at 11:00 and one in afternoon and 1.5 at night.  . citalopram (CELEXA) 10 MG tablet Taking 1 and half tablet  daily.  . ferrous sulfate 325 (65 FE) MG tablet Take 325 mg by mouth daily with breakfast.  . fluticasone-salmeterol (ADVAIR HFA) 115-21 MCG/ACT inhaler Inhale 2 puffs into the lungs 2 (two) times daily.  Marland Kitchen omeprazole (PRILOSEC) 20 MG capsule Take 1 capsule (20 mg total) by mouth 2 (two) times daily before a meal.  .  PROAIR HFA 108 (90 Base) MCG/ACT inhaler INHALE 2 PUFFS BY MOUTH INTO THE LUNGS EVERY 6 HOURS AS NEEDED FOR WHEEZING  . rosuvastatin (CRESTOR) 5 MG tablet TAKE 1 TABLET BY MOUTH ON MONDAY, WEDNESDAY AND FRIDAY  . traZODone (DESYREL) 50 MG tablet TAKE 2 TABLETS BY MOUTH AT BEDTIME  . vitamin B-12 (CYANOCOBALAMIN) 100 MCG tablet Take 100 mcg by mouth daily.  . [DISCONTINUED] amLODipine (NORVASC) 5 MG tablet Take 1 tablet (5 mg total) by mouth daily.  . [DISCONTINUED] calcium-vitamin D (OSCAL WITH D) 500-200 MG-UNIT tablet Take 1 tablet by mouth.   No facility-administered encounter medications on file as of 11/11/2019.   Review of Systems  Constitutional: Negative for appetite change and unexpected weight change.  HENT: Negative for congestion and sinus pressure.   Respiratory: Negative for cough and chest tightness.        Breathing stable.   Cardiovascular: Negative for chest pain and palpitations.       No increased leg swelling today.   Gastrointestinal: Positive for constipation. Negative for abdominal pain, diarrhea, nausea and vomiting.  Genitourinary: Negative for difficulty urinating and dysuria.  Musculoskeletal: Negative for joint swelling and myalgias.  Skin: Negative for color change and rash.  Neurological: Negative for dizziness, light-headedness and headaches.  Psychiatric/Behavioral: Negative for agitation and dysphoric mood.       Objective:    Physical Exam Constitutional:      General: She is not in acute distress.    Appearance: Normal appearance.  HENT:     Head: Normocephalic and atraumatic.     Right Ear: External ear normal.     Left Ear:  External ear normal.  Eyes:     General: No scleral icterus.       Right eye: No discharge.        Left eye: No discharge.     Conjunctiva/sclera: Conjunctivae normal.  Neck:     Thyroid: No thyromegaly.  Cardiovascular:     Rate and Rhythm: Normal rate and regular rhythm.  Pulmonary:     Effort: No respiratory distress.     Breath sounds: Normal breath sounds. No wheezing.  Abdominal:     General: Bowel sounds are normal.     Palpations: Abdomen is soft.     Tenderness: There is no abdominal tenderness.  Musculoskeletal:        General: No swelling or tenderness.     Cervical back: Neck supple. No tenderness.  Lymphadenopathy:     Cervical: No cervical adenopathy.  Skin:    Findings: No erythema or rash.  Neurological:     Mental Status: She is alert.  Psychiatric:        Mood and Affect: Mood normal.        Behavior: Behavior normal.     BP 140/76   Pulse 87   Temp 98 F (36.7 C)   Resp 16   Ht '5\' 4"'$  (1.626 m)   Wt 210 lb (95.3 kg)   SpO2 98%   BMI 36.05 kg/m  Wt Readings from Last 3 Encounters:  11/11/19 210 lb (95.3 kg)  11/01/19 214 lb (97.1 kg)  10/28/19 212 lb 15.4 oz (96.6 kg)     Lab Results  Component Value Date   WBC 9.5 11/11/2019   HGB 12.0 11/11/2019   HCT 36.0 11/11/2019   PLT 247.0 11/11/2019   GLUCOSE 93 11/11/2019   CHOL 161 10/07/2019   TRIG 96.0 10/07/2019   HDL 54.10 10/07/2019   LDLDIRECT  163.4 12/31/2012   LDLCALC 87 10/07/2019   ALT 4 10/07/2019   AST 15 10/07/2019   NA 139 11/11/2019   K 3.9 11/11/2019   CL 106 11/11/2019   CREATININE 1.15 11/11/2019   BUN 15 11/11/2019   CO2 28 11/11/2019   TSH 2.26 11/11/2019   INR 1.0 12/12/2012   HGBA1C 5.9 10/07/2019    DG Chest 2 View  Result Date: 11/02/2019 CLINICAL DATA:  Chest pain and hypertension EXAM: CHEST - 2 VIEW COMPARISON:  Radiograph 02/13/2019, CT 11/27/2018 FINDINGS: Redemonstration of several remote right rib deformities with associated chronic pleural  thickening similar to comparison radiographs and CT. Mild hyperinflation of the lungs with increased AP diameter to chest and flattening of the diaphragms. No focal consolidative opacity. No pneumothorax. Blunting of the costophrenic sulci similar to priors likely reflecting scarring. No visible effusion. Cardiomediastinal contours are stable from prior. No acute osseous or soft tissue abnormality. IMPRESSION: 1. No acute cardiopulmonary disease. 2. Remote right rib deformities with associated chronic pleural thickening. 3. Stable hyperinflation, compatible with history of COPD. Electronically Signed   By: Lovena Le M.D.   On: 11/02/2019 01:09   CT Head Wo Contrast  Result Date: 11/02/2019 CLINICAL DATA:  Headache, hypertension EXAM: CT HEAD WITHOUT CONTRAST TECHNIQUE: Contiguous axial images were obtained from the base of the skull through the vertex without intravenous contrast. COMPARISON:  CT 06/22/2018 FINDINGS: Brain: No evidence of acute infarction, hemorrhage, hydrocephalus, extra-axial collection or mass lesion/mass effect. Symmetric prominence of the ventricles, cisterns and sulci compatible with frontal predominant parenchymal volume loss. Patchy areas of white matter hypoattenuation are most compatible with chronic microvascular angiopathy. Stable dural calcifications. Vascular: Atherosclerotic calcification of the carotid siphons. No hyperdense vessel. Skull: No calvarial fracture or suspicious osseous lesion. No scalp swelling or hematoma. Sinuses/Orbits: Paranasal sinuses and mastoid air cells are predominantly clear. Included orbital structures are unremarkable. Other: None IMPRESSION: 1. No acute intracranial abnormality. 2. Stable frontal predominant parenchymal volume loss and chronic microvascular angiopathy. Electronically Signed   By: Lovena Le M.D.   On: 11/02/2019 06:30       Assessment & Plan:   Problem List Items Addressed This Visit    Anemia   Relevant Orders   CBC with  Differential/Platelet (Completed)   IBC + Ferritin (Completed)   Chronic obstructive pulmonary disease (HCC)    Breathing stable.  Followed by pulmonary.  Advair.        CKD (chronic kidney disease), stage III    Avoid antiinflammatories.  Stay hydrated.  Remain off hctz.        Constipation    Abdominal symptoms as outlined.  Check KUB.  Miralax.  Follow.  Keep appt for colonoscopy and continue f/u with GI.       Relevant Orders   DG Abd 1 View (Completed)   Depression, recurrent ([place])    On citalopram.  Follow.  Stable.        Elevated liver function tests    Follow liver panel.  Diet and exercise.        GERD (gastroesophageal reflux disease)    Controlled on prilosec.       History of colon cancer    Colonoscopy planned for next week.        History of malignant neoplasm metastatic to lung    S/p lobectomy.  Continue f/u with pulmonary.  Breathing stable.  On advair.        Hyperglycemia    Low carb diet and  exercise.  Follow met b and a1c.        Hypertension - Primary    Blood pressure varying as outlined.  Off hctz.  Amlodipine 2.5 mg q day.  Follow pressures. Follow metabolic panel.        Relevant Medications   amLODipine (NORVASC) 2.5 MG tablet   Other Relevant Orders   TSH (Completed)   Basic metabolic panel (Completed)   Parkinson's disease ([place])    Has been on sinemet.  Followed by neurology.  They are in the process of adjusting her medication pending blood pressure and medication adjustment.        Pure hypercholesterolemia    Low cholesterol diet and exercise.  Follow lipid panel.       Relevant Medications   amLODipine (NORVASC) 2.5 MG tablet   Swelling of lower extremity    No significant swelling today. Compression hose.  Leg elevation.  Follow.            Einar Pheasant, MD

## 2019-11-13 ENCOUNTER — Other Ambulatory Visit: Payer: Self-pay

## 2019-11-13 ENCOUNTER — Other Ambulatory Visit
Admission: RE | Admit: 2019-11-13 | Discharge: 2019-11-13 | Disposition: A | Discharge status: Home / Self Care | Payer: Medicare Other | Source: Ambulatory Visit | Attending: Gastroenterology | Admitting: Gastroenterology

## 2019-11-13 ENCOUNTER — Telehealth: Payer: Self-pay

## 2019-11-13 DIAGNOSIS — Z20822 Contact with and (suspected) exposure to covid-19: Secondary | ICD-10-CM | POA: Diagnosis not present

## 2019-11-13 DIAGNOSIS — Z01812 Encounter for preprocedural laboratory examination: Secondary | ICD-10-CM | POA: Insufficient documentation

## 2019-11-13 LAB — SARS CORONAVIRUS 2 (TAT 6-24 HRS): SARS Coronavirus 2: NEGATIVE

## 2019-11-13 NOTE — Telephone Encounter (Signed)
Patient LVM with front office expressing concern whether she should cancel her colonoscopy procedure on Tuesday 06/08 with Dr. Vicente Males due to her blood pressure being high.  Returned patients call LVM on home number to call me back to discuss and reschedule her colonoscopy.  Thanks,  [place], [place]

## 2019-11-15 ENCOUNTER — Encounter: Payer: Self-pay | Admitting: Internal Medicine

## 2019-11-15 NOTE — Assessment & Plan Note (Signed)
Colonoscopy planned for next week.

## 2019-11-15 NOTE — Assessment & Plan Note (Signed)
No significant swelling today. Compression hose.  Leg elevation.  Follow.

## 2019-11-15 NOTE — Assessment & Plan Note (Signed)
Has been on sinemet.  Followed by neurology.  They are in the process of adjusting her medication pending blood pressure and medication adjustment.

## 2019-11-15 NOTE — Assessment & Plan Note (Signed)
Low cholesterol diet and exercise.  Follow lipid panel.   

## 2019-11-15 NOTE — Assessment & Plan Note (Signed)
Controlled on prilosec.   

## 2019-11-15 NOTE — Assessment & Plan Note (Signed)
On citalopram.  Follow.  Stable.

## 2019-11-15 NOTE — Assessment & Plan Note (Signed)
Follow liver panel.  Diet and exercise.

## 2019-11-15 NOTE — Assessment & Plan Note (Signed)
Abdominal symptoms as outlined.  Check KUB.  Miralax.  Follow.  Keep appt for colonoscopy and continue f/u with GI.

## 2019-11-15 NOTE — Assessment & Plan Note (Signed)
S/p lobectomy.  Continue f/u with pulmonary.  Breathing stable.  On advair.

## 2019-11-15 NOTE — Assessment & Plan Note (Signed)
Avoid antiinflammatories.  Stay hydrated.  Remain off hctz.

## 2019-11-15 NOTE — Assessment & Plan Note (Signed)
Breathing stable.  Followed by pulmonary.  Advair.

## 2019-11-15 NOTE — Assessment & Plan Note (Signed)
Blood pressure varying as outlined.  Off hctz.  Amlodipine 2.5 mg q day.  Follow pressures. Follow metabolic panel.

## 2019-11-15 NOTE — Assessment & Plan Note (Signed)
Low carb diet and exercise.  Follow met b and a1c.   

## 2019-11-16 ENCOUNTER — Encounter: Payer: Self-pay | Admitting: Gastroenterology

## 2019-11-17 ENCOUNTER — Encounter
Admission: RE | Disposition: A | Discharge status: Home / Self Care | Payer: Self-pay | Source: Ambulatory Visit | Attending: Gastroenterology

## 2019-11-17 ENCOUNTER — Other Ambulatory Visit: Payer: Self-pay

## 2019-11-17 ENCOUNTER — Encounter: Payer: Self-pay | Admitting: Gastroenterology

## 2019-11-17 ENCOUNTER — Ambulatory Visit
Admission: RE | Admit: 2019-11-17 | Discharge: 2019-11-17 | Disposition: A | Discharge status: Home / Self Care | Payer: Medicare Other | Source: Ambulatory Visit | Attending: Gastroenterology | Admitting: Gastroenterology

## 2019-11-17 ENCOUNTER — Ambulatory Visit: Payer: Medicare Other | Admitting: Certified Registered"

## 2019-11-17 DIAGNOSIS — Z791 Long term (current) use of non-steroidal anti-inflammatories (NSAID): Secondary | ICD-10-CM | POA: Diagnosis not present

## 2019-11-17 DIAGNOSIS — K219 Gastro-esophageal reflux disease without esophagitis: Secondary | ICD-10-CM | POA: Insufficient documentation

## 2019-11-17 DIAGNOSIS — Z87442 Personal history of urinary calculi: Secondary | ICD-10-CM | POA: Insufficient documentation

## 2019-11-17 DIAGNOSIS — F419 Anxiety disorder, unspecified: Secondary | ICD-10-CM | POA: Insufficient documentation

## 2019-11-17 DIAGNOSIS — Z801 Family history of malignant neoplasm of trachea, bronchus and lung: Secondary | ICD-10-CM | POA: Insufficient documentation

## 2019-11-17 DIAGNOSIS — Z886 Allergy status to analgesic agent status: Secondary | ICD-10-CM | POA: Diagnosis not present

## 2019-11-17 DIAGNOSIS — I129 Hypertensive chronic kidney disease with stage 1 through stage 4 chronic kidney disease, or unspecified chronic kidney disease: Secondary | ICD-10-CM | POA: Diagnosis not present

## 2019-11-17 DIAGNOSIS — Z888 Allergy status to other drugs, medicaments and biological substances status: Secondary | ICD-10-CM | POA: Insufficient documentation

## 2019-11-17 DIAGNOSIS — E7800 Pure hypercholesterolemia, unspecified: Secondary | ICD-10-CM | POA: Diagnosis not present

## 2019-11-17 DIAGNOSIS — Z6832 Body mass index (BMI) 32.0-32.9, adult: Secondary | ICD-10-CM | POA: Insufficient documentation

## 2019-11-17 DIAGNOSIS — Z818 Family history of other mental and behavioral disorders: Secondary | ICD-10-CM | POA: Insufficient documentation

## 2019-11-17 DIAGNOSIS — Z8249 Family history of ischemic heart disease and other diseases of the circulatory system: Secondary | ICD-10-CM | POA: Insufficient documentation

## 2019-11-17 DIAGNOSIS — J45909 Unspecified asthma, uncomplicated: Secondary | ICD-10-CM | POA: Insufficient documentation

## 2019-11-17 DIAGNOSIS — F329 Major depressive disorder, single episode, unspecified: Secondary | ICD-10-CM | POA: Diagnosis not present

## 2019-11-17 DIAGNOSIS — D122 Benign neoplasm of ascending colon: Secondary | ICD-10-CM | POA: Diagnosis not present

## 2019-11-17 DIAGNOSIS — Z86010 Personal history of colonic polyps: Secondary | ICD-10-CM | POA: Insufficient documentation

## 2019-11-17 DIAGNOSIS — N183 Chronic kidney disease, stage 3 unspecified: Secondary | ICD-10-CM | POA: Diagnosis not present

## 2019-11-17 DIAGNOSIS — Z1211 Encounter for screening for malignant neoplasm of colon: Secondary | ICD-10-CM | POA: Insufficient documentation

## 2019-11-17 DIAGNOSIS — M5137 Other intervertebral disc degeneration, lumbosacral region: Secondary | ICD-10-CM | POA: Insufficient documentation

## 2019-11-17 DIAGNOSIS — I10 Essential (primary) hypertension: Secondary | ICD-10-CM | POA: Diagnosis not present

## 2019-11-17 DIAGNOSIS — D126 Benign neoplasm of colon, unspecified: Secondary | ICD-10-CM | POA: Diagnosis not present

## 2019-11-17 DIAGNOSIS — Z85038 Personal history of other malignant neoplasm of large intestine: Secondary | ICD-10-CM

## 2019-11-17 DIAGNOSIS — Z79899 Other long term (current) drug therapy: Secondary | ICD-10-CM | POA: Diagnosis not present

## 2019-11-17 DIAGNOSIS — Z881 Allergy status to other antibiotic agents status: Secondary | ICD-10-CM | POA: Insufficient documentation

## 2019-11-17 DIAGNOSIS — J449 Chronic obstructive pulmonary disease, unspecified: Secondary | ICD-10-CM | POA: Insufficient documentation

## 2019-11-17 DIAGNOSIS — K635 Polyp of colon: Secondary | ICD-10-CM | POA: Diagnosis not present

## 2019-11-17 DIAGNOSIS — Z90710 Acquired absence of both cervix and uterus: Secondary | ICD-10-CM | POA: Insufficient documentation

## 2019-11-17 DIAGNOSIS — Z9049 Acquired absence of other specified parts of digestive tract: Secondary | ICD-10-CM | POA: Insufficient documentation

## 2019-11-17 DIAGNOSIS — G20 Parkinson's disease: Secondary | ICD-10-CM | POA: Insufficient documentation

## 2019-11-17 DIAGNOSIS — D125 Benign neoplasm of sigmoid colon: Secondary | ICD-10-CM | POA: Insufficient documentation

## 2019-11-17 DIAGNOSIS — Z7982 Long term (current) use of aspirin: Secondary | ICD-10-CM | POA: Insufficient documentation

## 2019-11-17 DIAGNOSIS — E785 Hyperlipidemia, unspecified: Secondary | ICD-10-CM | POA: Insufficient documentation

## 2019-11-17 DIAGNOSIS — Z833 Family history of diabetes mellitus: Secondary | ICD-10-CM | POA: Insufficient documentation

## 2019-11-17 HISTORY — PX: COLONOSCOPY WITH PROPOFOL: SHX5780

## 2019-11-17 SURGERY — COLONOSCOPY WITH PROPOFOL
Anesthesia: General

## 2019-11-17 MED ORDER — SODIUM CHLORIDE 0.9 % IV SOLN: INTRAVENOUS | Status: DC

## 2019-11-17 MED ORDER — PROPOFOL 500 MG/50ML IV EMUL
INTRAVENOUS | Status: DC | PRN
  Administered 2019-11-17: 120 ug/kg/min via INTRAVENOUS

## 2019-11-17 MED ORDER — PROPOFOL 500 MG/50ML IV EMUL
INTRAVENOUS | Status: AC
  Filled 2019-11-17: qty 50

## 2019-11-17 NOTE — Anesthesia Preprocedure Evaluation (Signed)
Anesthesia Evaluation  Patient identified by MRN, date of birth, ID band Patient awake    Reviewed: Allergy & Precautions, NPO status , Patient's Chart, lab work & pertinent test results  History of Anesthesia Complications Negative for: history of anesthetic complications  Airway Mallampati: II       Dental  (+) Implants, Dental Advidsory Given   Pulmonary neg shortness of breath, asthma , neg sleep apnea, COPD,  COPD inhaler, neg recent URI, Not current smoker,           Cardiovascular hypertension, Pt. on medications (-) angina+ Peripheral Vascular Disease  (-) Past MI, (-) Cardiac Stents and (-) CHF + dysrhythmias (occassional palpitation) (-) Valvular Problems/Murmurs     Neuro/Psych neg Seizures PSYCHIATRIC DISORDERS Anxiety Depression TIA Neuromuscular disease (Parkinson's disease)    GI/Hepatic Neg liver ROS, GERD  Medicated and Controlled,  Endo/Other  neg diabetes  Renal/GU Renal InsufficiencyRenal disease     Musculoskeletal   Abdominal   Peds  Hematology   Anesthesia Other Findings Past Medical History: No date: Anxiety No date: Asthma No date: Atypical chest pain     Comment:  a. 2003 Cath: reportedly nl per pt [name]);  b.               04/2015 [place] Admission, neg troponin. 1999: Colon cancer ([place])     Comment:  a. 1999: Pt had partial colectomy. Did develop lung               metastasis. Had right upper lobectomy in 01/2007 then had              associated chemotherapy. No date: COPD (chronic obstructive pulmonary disease) (HCC) No date: DDD (degenerative disc disease), lumbosacral No date: Depression No date: Echocardiogram abnormal     Comment:  a. 02/2010 Echo: EF 55-60%, mild LAE, no regional wall               motion abnormalities No date: Elevated transaminase level     Comment:  a. ? NASH;  b. 06/2015 - nl LFTs. No date: GERD (gastroesophageal reflux disease) No date: Hematuria No  date: Holter monitor, abnormal     Comment:  a. 12/2009 Holter monitoring: NSR, rare PACs and PVCs. No date: Hypercholesterolemia No date: Hyperlipidemia No date: Hypertension No date: Leaky heart valve     Comment:  a. Per pt report - no evidence of valvular abnormalities              on prior echoes. 10/20/2017: Lesion of skin of Right breast 2008: Lung metastases ([place])     Comment:  a. S/P resection (lobectomy - right) No date: Morbid obesity ([place]) No date: Nephrolithiasis 08/2016: Parkinson disease ([place])     Comment:  [name]   Reproductive/Obstetrics                             Anesthesia Physical  Anesthesia Plan  ASA: III  Anesthesia Plan: General   Post-op Pain Management:    Induction: Intravenous  PONV Risk Score and Plan: 3 and TIVA and Propofol infusion  Airway Management Planned: Nasal Cannula and Natural Airway  Additional Equipment:   Intra-op Plan:   Post-operative Plan:   Informed Consent: I have reviewed the patients History and Physical, chart, labs and discussed the procedure including the risks, benefits and alternatives for the proposed anesthesia with the patient or authorized representative who has indicated his/her understanding and acceptance.  Plan Discussed with:   Anesthesia Plan Comments:         Anesthesia Quick Evaluation

## 2019-11-17 NOTE — Anesthesia Postprocedure Evaluation (Signed)
Anesthesia Post Note  Patient: Victoria Hanson  Procedure(s) Performed: COLONOSCOPY WITH PROPOFOL (N/A )  Patient location during evaluation: Endoscopy Anesthesia Type: General Level of consciousness: awake and alert Pain management: pain level controlled Vital Signs Assessment: post-procedure vital signs reviewed and stable Respiratory status: spontaneous breathing, nonlabored ventilation, respiratory function stable and patient connected to nasal cannula oxygen Cardiovascular status: blood pressure returned to baseline and stable Postop Assessment: no apparent nausea or vomiting Anesthetic complications: no     Last Vitals:  Vitals:   11/17/19 0945 11/17/19 0955  BP: 103/83 (!) 153/78  Pulse: 64   Resp: 15   Temp:    SpO2: 99%     Last Pain:  Vitals:   11/17/19 0955  TempSrc:   PainSc: 0-No pain                 Martha Clan

## 2019-11-17 NOTE — Op Note (Signed)
[facility] Patient Name: Victoria Hanson Procedure Date: 11/17/2019 9:04 AM MRN: [id number] Account #: [id number] Date of Birth: [birth year] Admit Type: Outpatient Age: 69 Room: [facility] ENDO ROOM 1 Gender: Female Note Status: Finalized Procedure:             Colonoscopy Indications:           High risk colon cancer surveillance: Personal history                         of colon cancer Providers:             [name] MD, MD Referring MD:          Kenn File. Irish Lack, MD (Referring MD) Medicines:             Monitored Anesthesia Care Complications:         No immediate complications. Procedure:             Pre-Anesthesia Assessment:                        - Prior to the procedure, a History and Physical was                         performed, and patient medications, allergies and                         sensitivities were reviewed. The patient's tolerance                         of previous anesthesia was reviewed.                        - The risks and benefits of the procedure and the                         sedation options and risks were discussed with the                         patient. All questions were answered and informed                         consent was obtained.                        - The risks and benefits of the procedure and the                         sedation options and risks were discussed with the                         patient. All questions were answered and informed                         consent was obtained.                        - ASA Grade Assessment: II - A patient with mild                         systemic  disease.                        After obtaining informed consent, the colonoscope was                         passed under direct vision. Throughout the procedure,                         the patient's blood pressure, pulse, and oxygen                         saturations were monitored continuously. The                   Colonoscope was introduced through the anus and                         advanced to the the cecum, identified by the                         appendiceal orifice. The colonoscopy was performed                         with ease. The patient tolerated the procedure well.                         The quality of the bowel preparation was excellent. Findings:      The perianal and digital rectal examinations were normal.      Two sessile polyps were found in the sigmoid colon and ascending colon.       The polyps were 4 to 5 mm in size. These polyps were removed with a cold       snare. Resection and retrieval were complete.      The exam was otherwise without abnormality on direct and retroflexion       views. Impression:            - Two 4 to 5 mm polyps in the sigmoid colon and in the                         ascending colon, removed with a cold snare. Resected                         and retrieved.                        - The examination was otherwise normal on direct and                         retroflexion views. Recommendation:        - Discharge patient to home (with escort).                        - Resume previous diet.                        - Continue present medications.                        - Await pathology results.                        -  Repeat colonoscopy in 5 years for surveillance. Procedure Code(s):     --- Professional ---                        [phone], Colonoscopy, flexible; with removal of                         tumor(s), polyp(s), or other lesion(s) by snare                         technique Diagnosis Code(s):     --- Professional ---                        Q30.092, Personal history of other malignant neoplasm                         of large intestine                        K63.5, Polyp of colon CPT copyright 2019 American Medical Association. All rights reserved. The codes documented in this report are preliminary and upon coder review may  be  revised to meet current compliance requirements. [name], MD [name] MD, MD 11/17/2019 9:34:55 AM This report has been signed electronically. Number of Addenda: 0 Note Initiated On: 11/17/2019 9:04 AM Scope Withdrawal Time: 0 hours 12 minutes 7 seconds  Total Procedure Duration: 0 hours 18 minutes 54 seconds  Estimated Blood Loss:  Estimated blood loss: none.      [facility]

## 2019-11-17 NOTE — Transfer of Care (Signed)
Immediate Anesthesia Transfer of Care Note  Patient: Victoria Hanson  Procedure(s) Performed: COLONOSCOPY WITH PROPOFOL (N/A )  Patient Location: PACU and Endoscopy Unit  Anesthesia Type:General  Level of Consciousness: sedated  Airway & Oxygen Therapy: Patient Spontanous Breathing  Post-op Assessment: Report given to RN  Post vital signs: stable  Last Vitals:  Vitals Value Taken Time  BP    Temp    Pulse    Resp    SpO2      Last Pain:  Vitals:   11/17/19 0811  TempSrc: Temporal  PainSc: 0-No pain         Complications: No apparent anesthesia complications

## 2019-11-17 NOTE — H&P (Signed)
Victoria Bellows, MD [address] [place], [place], [place], [address] [id number], [place], [place], [place], [address] Phone: [phone]  Fax: [phone]  Primary Care Physician:  Victoria Pheasant, MD   Pre-Procedure History & Physical: HPI:  Victoria Hanson is a 69 y.o. female is here for an colonoscopy.   Past Medical History:  Diagnosis Date  . Anxiety   . Asthma   . Atypical chest pain    a. 2003 Cath: reportedly nl per pt Victoria Hanson);  b. 04/2015 [place] [facility] Admission, neg troponin.  . Colon cancer ([place]) 1999   a. 1999: Pt had partial colectomy. Did develop lung metastasis. Had right upper lobectomy in 01/2007 then had associated chemotherapy.  Marland Kitchen COPD (chronic obstructive pulmonary disease) ([place])   . DDD (degenerative disc disease), lumbosacral   . Depression   . Echocardiogram abnormal    a. 02/2010 Echo: EF 55-60%, mild LAE, no regional wall motion abnormalities  . Elevated transaminase level    a. ? NASH;  b. 06/2015 - nl LFTs.  . GERD (gastroesophageal reflux disease)   . Hematuria   . Holter monitor, abnormal    a. 12/2009 Holter monitoring: NSR, rare PACs and PVCs.  Marland Kitchen Hypercholesterolemia   . Hyperlipidemia   . Hypertension   . Leaky heart valve    a. Per pt report - no evidence of valvular abnormalities on prior echoes.  . Lesion of skin of Right breast 10/20/2017  . Lung metastases ([place]) 2008   a. S/P resection (lobectomy - right)  . Morbid obesity ([place])   . Nephrolithiasis   . Parkinson disease ([place]) 08/2016   [name]    Past Surgical History:  Procedure Laterality Date  . BREAST EXCISIONAL BIOPSY Right 10/07/2009   benign  . CARDIAC CATHETERIZATION  2003   negative as per pt report  . CHOLECYSTECTOMY  2000  . COLON SURGERY     Colon cancer, removed part of large colon  . COLONOSCOPY N/A 04/25/2015   Procedure: COLONOSCOPY;  Surgeon: Manya Silvas, MD;  Location: [facility] ENDOSCOPY;  Service: Endoscopy;  Laterality: N/A;  . COLONOSCOPY WITH PROPOFOL N/A  06/14/2017   Procedure: COLONOSCOPY WITH PROPOFOL;  Surgeon: Manya Silvas, MD;  Location: [facility] ENDOSCOPY;  Service: Endoscopy;  Laterality: N/A;  . LOBECTOMY  01/2007   Right lung, upper lobe right side removed  . TOTAL ABDOMINAL HYSTERECTOMY     abnormal bleeding  . TUBAL LIGATION  1980    Prior to Admission medications   Medication Sig Start Date End Date Taking? Authorizing Provider  ALPRAZolam Duanne Moron) 0.5 MG tablet TAKE 1/2 TABLET BY MOUTH DAILY AS NEEDED 10/16/19  Yes Victoria Pheasant, MD  amLODipine (NORVASC) 2.5 MG tablet Take 2.5 mg by mouth daily. 11/10/19  Yes [provider]  aspirin EC 81 MG tablet Take 81 mg by mouth as needed.    Yes [provider]  calcium-vitamin D (OSCAL WITH D) 500-200 MG-UNIT tablet Take 1 tablet by mouth.   Yes [provider]  carbidopa-levodopa (SINEMET) 25-100 MG tablet Take 1.5 in am and one at 11:00 and one in afternoon and 1.5 at night. 06/23/19  Yes Victoria Pheasant, MD  citalopram (CELEXA) 10 MG tablet Taking 1 and half tablet daily. 09/14/19  Yes Victoria Pheasant, MD  ferrous sulfate 325 (65 FE) MG tablet Take 325 mg by mouth daily with breakfast.   Yes [provider]  fluticasone-salmeterol (ADVAIR HFA) 115-21 MCG/ACT inhaler Inhale 2 puffs into the lungs 2 (two) times  daily. 10/15/19  Yes Tyler Pita, MD  omeprazole (PRILOSEC) 20 MG capsule Take 1 capsule (20 mg total) by mouth 2 (two) times daily before a meal. 06/23/19  Yes Victoria Pheasant, MD  PROAIR HFA 108 (90 Base) MCG/ACT inhaler INHALE 2 PUFFS BY MOUTH INTO THE LUNGS EVERY 6 HOURS AS NEEDED FOR WHEEZING 05/11/19  Yes Victoria Pheasant, MD  rosuvastatin (CRESTOR) 5 MG tablet TAKE 1 TABLET BY MOUTH ON MONDAY, [facility] AND FRIDAY 11/10/19  Yes Victoria Pheasant, MD  traZODone (DESYREL) 50 MG tablet TAKE 2 TABLETS BY MOUTH AT BEDTIME 09/14/19  Yes Victoria Pheasant, MD  vitamin B-12 (CYANOCOBALAMIN) 100 MCG tablet Take 100 mcg by mouth daily.   Yes [provider]    Allergies as of 10/13/2019 - Review Complete 10/13/2019  Allergen Reaction Noted  . Macrobid [nitrofurantoin macrocrystal] Shortness Of Breath and Palpitations 04/24/2012  . Antihistamines, diphenhydramine-type Other (See Comments) 10/18/2014  . Aspirin Other (See Comments) 10/04/2014  . Ceftin [cefuroxime axetil] Other (See Comments) 04/24/2012  . Celecoxib Other (See Comments) 10/18/2014  . Cymbalta [duloxetine hcl] Other (See Comments) 04/24/2012  . Diphenhydramine Other (See Comments) 04/22/2015  . Escitalopram Other (See Comments) 04/22/2015  . Lexapro [escitalopram oxalate] Other (See Comments) 02/19/2013  . Metronidazole Other (See Comments) 10/18/2014  . Nitrofurantoin Other (See Comments) 11/16/2014  . Zoloft [sertraline hcl]  04/24/2012  . Ciprofloxacin Other (See Comments) 12/31/2012    Family History  Problem Relation Age of Onset  . Coronary artery disease Mother        died @ 42 of MI.  . Mental illness Mother   . Diabetes Mother   . Heart attack Father 19       MI  . Cancer Father        lung - died in his mid-70's  . Hypertension Father   . Diabetes Father   . Sudden death Brother   . Mental illness Brother   . Heart attack Brother        MI @ 26, died @ age 21.  . Breast cancer Neg Hx     Social History   Socioeconomic History  . Marital status: Widowed    Spouse name: Not on file  . Number of children: Not on file  . Years of education: Not on file  . Highest education level: Not on file  Occupational History  . Not on file  Tobacco Use  . Smoking status: Passive Smoke Exposure - Never Smoker  . Smokeless tobacco: Never Used  . Tobacco comment: husband and son smoked in her home.  Substance and Sexual Activity  . Alcohol use: Not Currently    Alcohol/week: 1.0 standard drinks    Types: 1 Glasses of wine per week    Comment: rare wine cooler.  . Drug use: No  . Sexual activity: Never  Other Topics Concern  . Not on file    Social History Narrative   Widowed.   Disabled (colon/lung CA), retired.   Lives in [place] with her dtr, grand-dtr, and son.   Active.   Gets regular exercise, walks.   Social Determinants of Health   Financial Resource Strain: Low Risk   . Difficulty of Paying Living Expenses: Not hard at all  Food Insecurity: No Food Insecurity  . Worried About Charity fundraiser in the Last Year: Never true  . Ran Out of Food in the Last Year: Never true  Transportation Needs: No Transportation Needs  . Lack  of Transportation (Medical): No  . Lack of Transportation (Non-Medical): No  Physical Activity:   . Days of Exercise per Week:   . Minutes of Exercise per Session:   Stress:   . Feeling of Stress :   Social Connections:   . Frequency of Communication with Friends and Family:   . Frequency of Social Gatherings with Friends and Family:   . Attends Religious Services:   . Active Member of Clubs or Organizations:   . Attends Archivist Meetings:   Marland Kitchen Marital Status:   Intimate Partner Violence:   . Fear of Current or Ex-Partner:   . Emotionally Abused:   Marland Kitchen Physically Abused:   . Sexually Abused:     Review of Systems: See HPI, otherwise negative ROS  Physical Exam: BP (!) 148/82   Pulse 65   Temp (!) 97 F (36.1 C) (Temporal)   Resp 18   Ht 5\' 3"  (1.6 m)   Wt 95.3 kg   SpO2 100%   BMI 37.20 kg/m  General:   Alert,  pleasant and cooperative in NAD Head:  Normocephalic and atraumatic. Neck:  Supple; no masses or thyromegaly. Lungs:  Clear throughout to auscultation, normal respiratory effort.    Heart:  +S1, +S2, Regular rate and rhythm, No edema. Abdomen:  Soft, nontender and nondistended. Normal bowel sounds, without guarding, and without rebound.   Neurologic:  Alert and  oriented x4;  grossly normal neurologically.  Impression/Plan: Victoria Hanson is here for an colonoscopy to be performed for surveillance due to prior history of colon  cancer  Risks, benefits, limitations, and alternatives regarding  colonoscopy have been reviewed with the patient.  Questions have been answered.  All parties agreeable.   Victoria Bellows, MD  11/17/2019, 9:08 AM

## 2019-11-18 ENCOUNTER — Ambulatory Visit: Payer: Medicare Other | Admitting: Nurse Practitioner

## 2019-11-18 ENCOUNTER — Encounter: Payer: Self-pay | Admitting: *Deleted

## 2019-11-18 LAB — SURGICAL PATHOLOGY

## 2019-11-20 ENCOUNTER — Other Ambulatory Visit: Payer: Self-pay | Admitting: Internal Medicine

## 2019-11-20 ENCOUNTER — Ambulatory Visit (INDEPENDENT_AMBULATORY_CARE_PROVIDER_SITE_OTHER): Payer: Medicare Other

## 2019-11-20 VITALS — BP 168/94 | HR 75 | Ht 63.0 in | Wt 210.0 lb

## 2019-11-20 DIAGNOSIS — Z0000 Encounter for general adult medical examination without abnormal findings: Secondary | ICD-10-CM | POA: Diagnosis not present

## 2019-11-20 NOTE — Patient Instructions (Addendum)
  Ms. Badders , Thank you for taking time to come for your Medicare Wellness Visit. I appreciate your ongoing commitment to your health goals. Please review the following plan we discussed and let me know if I can assist you in the future.   These are the goals we discussed: Goals      Patient Stated   .  DIET - REDUCE PORTION SIZE (pt-stated)      Reduce the amount of salt intake and products like hotdogs and bologna       This is a list of the screening recommended for you and due dates:  Health Maintenance  Topic Date Due  . Tetanus Vaccine  Never done  . Mammogram  02/07/2019  . COVID-19 Vaccine (1) 12/06/2019*  . Flu Shot  01/10/2020  . Colon Cancer Screening  11/16/2021  . DEXA scan (bone density measurement)  Completed  .  Hepatitis C: One time screening is recommended by Center for Disease Control  (CDC) for  adults born from [birth year] through 1966.   Completed  . Pneumonia vaccines  Completed  *Topic was postponed. The date shown is not the original due date.

## 2019-11-20 NOTE — Progress Notes (Addendum)
Subjective:   Victoria Hanson is a 69 y.o. female who presents for Medicare Annual (Subsequent) preventive examination.  Review of Systems:  No ROS.  Medicare Wellness Virtual Visit.   Cardiac Risk Factors include: advanced age (>67men, >38 women);hypertension     Objective:     Vitals: BP (!) 168/94 (BP Location: Left Arm, Patient Position: Sitting, Cuff Size: Normal)   Pulse 75   Ht 5\' 3"  (1.6 m)   Wt 210 lb (95.3 kg)   BMI 37.20 kg/m   Body mass index is 37.2 kg/m.  Advanced Directives 11/20/2019 11/17/2019 11/01/2019 10/28/2019 02/13/2019 02/02/2019 01/29/2019  Does Patient Have a Medical Advance Directive? No No No No Yes Yes No  Type of Advance Directive - - - - Public house manager  Does patient want to make changes to medical advance directive? - - - - - - -  Copy of Press photographer in Chart? - - - - - - -  Would patient like information on creating a medical advance directive? No - Patient declined No - Patient declined - - - - -    Tobacco Social History   Tobacco Use  Smoking Status Passive Smoke Exposure - Never Smoker  Smokeless Tobacco Never Used  Tobacco Comment   husband and son smoked in her home.     Counseling given: Not Answered Comment: husband and son smoked in her home.   Clinical Intake:  Pre-visit preparation completed: Yes        Diabetes: No  How often do you need to have someone help you when you read instructions, pamphlets, or other written materials from your doctor or pharmacy?: 1 - Never  Interpreter Needed?: No     Past Medical History:  Diagnosis Date  . Anxiety   . Asthma   . Atypical chest pain    a. 2003 Cath: reportedly nl per pt [name]);  b. 04/2015 [place] Admission, neg troponin.  . Colon cancer ([place]) 1999   a. 1999: Pt had partial colectomy. Did develop lung metastasis. Had right upper lobectomy in 01/2007 then had associated  chemotherapy.  Marland Kitchen COPD (chronic obstructive pulmonary disease) ([place])   . DDD (degenerative disc disease), lumbosacral   . Depression   . Echocardiogram abnormal    a. 02/2010 Echo: EF 55-60%, mild LAE, no regional wall motion abnormalities  . Elevated transaminase level    a. ? NASH;  b. 06/2015 - nl LFTs.  . GERD (gastroesophageal reflux disease)   . Hematuria   . Holter monitor, abnormal    a. 12/2009 Holter monitoring: NSR, rare PACs and PVCs.  Marland Kitchen Hypercholesterolemia   . Hyperlipidemia   . Hypertension   . Leaky heart valve    a. Per pt report - no evidence of valvular abnormalities on prior echoes.  . Lesion of skin of Right breast 10/20/2017  . Lung metastases ([place]) 2008   a. S/P resection (lobectomy - right)  . Morbid obesity ([place])   . Nephrolithiasis   . Parkinson disease ([place]) 08/2016   [name]   Past Surgical History:  Procedure Laterality Date  . BREAST EXCISIONAL BIOPSY Right 10/07/2009   benign  . CARDIAC CATHETERIZATION  2003   negative as per pt report  . CHOLECYSTECTOMY  2000  . COLON SURGERY     Colon cancer, removed part of large colon  . COLONOSCOPY N/A 04/25/2015   Procedure: COLONOSCOPY;  Surgeon: Manya Silvas, MD;  Location: [facility] ENDOSCOPY;  Service: Endoscopy;  Laterality: N/A;  . COLONOSCOPY WITH PROPOFOL N/A 06/14/2017   Procedure: COLONOSCOPY WITH PROPOFOL;  Surgeon: Manya Silvas, MD;  Location: [facility] ENDOSCOPY;  Service: Endoscopy;  Laterality: N/A;  . COLONOSCOPY WITH PROPOFOL N/A 11/17/2019   Procedure: COLONOSCOPY WITH PROPOFOL;  Surgeon: [name], MD;  Location: [facility] ENDOSCOPY;  Service: Gastroenterology;  Laterality: N/A;  . LOBECTOMY  01/2007   Right lung, upper lobe right side removed  . TOTAL ABDOMINAL HYSTERECTOMY     abnormal bleeding  . TUBAL LIGATION  1980   Family History  Problem Relation Age of Onset  . Coronary artery disease Mother        died @ 108 of MI.  . Mental illness Mother   . Diabetes Mother   . Heart attack  Father 92       MI  . Cancer Father        lung - died in his mid-70's  . Hypertension Father   . Diabetes Father   . Sudden death Brother   . Mental illness Brother   . Heart attack Brother        MI @ 2, died @ age 35.  . Breast cancer Neg Hx    Social History   Socioeconomic History  . Marital status: Widowed    Spouse name: Not on file  . Number of children: Not on file  . Years of education: Not on file  . Highest education level: Not on file  Occupational History  . Not on file  Tobacco Use  . Smoking status: Passive Smoke Exposure - Never Smoker  . Smokeless tobacco: Never Used  . Tobacco comment: husband and son smoked in her home.  Vaping Use  . Vaping Use: Never used  Substance and Sexual Activity  . Alcohol use: Not Currently    Alcohol/week: 0.0 standard drinks  . Drug use: No  . Sexual activity: Never  Other Topics Concern  . Not on file  Social History Narrative   Widowed.   Disabled (colon/lung CA), retired.   Lives in [place] with her dtr, grand-dtr, and son.   Active.   Gets regular exercise, walks.   Social Determinants of Health   Financial Resource Strain:   . Difficulty of Paying Living Expenses:   Food Insecurity:   . Worried About Charity fundraiser in the Last Year:   . Arboriculturist in the Last Year:   Transportation Needs:   . Film/video editor (Medical):   Marland Kitchen Lack of Transportation (Non-Medical):   Physical Activity:   . Days of Exercise per Week:   . Minutes of Exercise per Session:   Stress:   . Feeling of Stress :   Social Connections:   . Frequency of Communication with Friends and Family:   . Frequency of Social Gatherings with Friends and Family:   . Attends Religious Services:   . Active Member of Clubs or Organizations:   . Attends Archivist Meetings:   Marland Kitchen Marital Status:     Outpatient Encounter Medications as of 11/20/2019  Medication Sig  . ALPRAZolam (XANAX) 0.5 MG tablet TAKE 1/2 TABLET  BY MOUTH DAILY AS NEEDED  . amLODipine (NORVASC) 2.5 MG tablet Take 2.5 mg by mouth daily.  Marland Kitchen aspirin EC 81 MG tablet Take 81 mg by mouth as needed.   . calcium-vitamin D (OSCAL WITH D) 500-200 MG-UNIT tablet Take 1 tablet by mouth.  Marland Kitchen  carbidopa-levodopa (SINEMET) 25-100 MG tablet Take 1.5 in am and one at 11:00 and one in afternoon and 1.5 at night.  . citalopram (CELEXA) 10 MG tablet Taking 1 and half tablet daily.  . ferrous sulfate 325 (65 FE) MG tablet Take 325 mg by mouth daily with breakfast.  . fluticasone-salmeterol (ADVAIR HFA) 115-21 MCG/ACT inhaler Inhale 2 puffs into the lungs 2 (two) times daily.  Marland Kitchen omeprazole (PRILOSEC) 20 MG capsule Take 1 capsule (20 mg total) by mouth 2 (two) times daily before a meal.  . PROAIR HFA 108 (90 Base) MCG/ACT inhaler INHALE 2 PUFFS BY MOUTH INTO THE LUNGS EVERY 6 HOURS AS NEEDED FOR WHEEZING  . rosuvastatin (CRESTOR) 5 MG tablet TAKE 1 TABLET BY MOUTH ON MONDAY, WEDNESDAY AND FRIDAY  . traZODone (DESYREL) 50 MG tablet TAKE 2 TABLETS BY MOUTH AT BEDTIME  . vitamin B-12 (CYANOCOBALAMIN) 100 MCG tablet Take 100 mcg by mouth daily.   No facility-administered encounter medications on file as of 11/20/2019.    Activities of Daily Living In your present state of health, do you have any difficulty performing the following activities: 11/20/2019  Preparing Food and eating ? N  Using the Toilet? N  In the past six months, have you accidently leaked urine? Y  Comment Managed with daily liner  Do you have problems with loss of bowel control? N  Managing your Medications? N  Managing your Finances? N  Housekeeping or managing your Housekeeping? N  Some recent data might be hidden    Patient Care Team: Einar Pheasant, MD as PCP - General (Unknown Physician Specialty) Jettie Booze, MD as PCP - Cardiology (Cardiology) Manya Silvas, MD (Inactive) (Gastroenterology)    Assessment:   This is a routine wellness examination for Kasyn.  I  connected with Christe today by telephone and verified that I am speaking with the correct person using two identifiers. Location patient: home Location provider: work Persons participating in the virtual visit: patient, Marine scientist.    I discussed the limitations, risks, security and privacy concerns of performing an evaluation and management service by telephone and the availability of in person appointments. The patient expressed understanding and verbally consented to this telephonic visit.    Interactive audio and video telecommunications were attempted between this provider and patient, however failed, due to patient having technical difficulties OR patient did not have access to video capability.  We continued and completed visit with audio only.  Some vital signs may be absent or patient reported.   Health Maintenance Due: -Tdap vaccine- discussed; to be completed with doctor in visit or local pharmacy.   See completed HM at the end of note.   Eye: Visual acuity not assessed. Virtual visit. Followed by their ophthalmologist. Wears glasses.   Dental: Discussed the importance of dental care.   Hearing: Demonstrates normal hearing during visit.  Safety:  Patient feels safe at home- yes Patient does have smoke detectors at home- yes Patient does wear sunscreen or protective clothing when in direct sunlight - yes Patient does wear seat belt when in a moving vehicle - yes Patient drives- yes Adequate lighting in walkways free from debris- yes Grab bars and handrails used as appropriate- yes Ambulates with an assistive device- no Cell phone on person when ambulating outside of the home- yes  Social: Alcohol intake - no  Smoking history- never   Smokers in home? 3 adult children  Illicit drug use? none  Medication: Taking as directed and without issues.  Pill box in use -yes  Self managed - yes   Covid-19: Precautions and sickness symptoms discussed. Wears mask, social  distancing, hand hygiene as appropriate.   Activities of Daily Living Patient denies needing assistance with: household chores, feeding themselves, getting from bed to chair, getting to the toilet, bathing/showering, dressing, managing money, or preparing meals.   Discussed the importance of a healthy diet, water intake and the benefits of aerobic exercise.   Physical activity- active around the home, no routine. Encouraged to stay active.   Diet:  Regular Water: good intake Caffeine: 3-4 tea  Other Providers Patient Care Team: Einar Pheasant, MD as PCP - General (Unknown Physician Specialty) Jettie Booze, MD as PCP - Cardiology (Cardiology) Manya Silvas, MD (Inactive) (Gastroenterology)  Exercise Activities and Dietary recommendations Current Exercise Habits: The patient does not participate in regular exercise at present  Goals      Patient Stated   .  DIET - REDUCE PORTION SIZE (pt-stated)      Reduce the amount of salt intake and products like hotdogs and bologna       Fall Risk Fall Risk  11/20/2019 02/26/2019 02/06/2019 11/19/2018 10/29/2017  Falls in the past year? 0 0 0 0 Yes  Number falls in past yr: 0 0 0 - 1  Injury with Fall? - 0 - - No  Follow up Falls evaluation completed Falls evaluation completed Falls evaluation completed - -   Is the patient's home free of loose throw rugs in walkways, pet beds, electrical cords, etc?  Yes      Handrails on the stairs?  Yes      Adequate lighting? Yes  Timed Get Up and Go performed: No, virtual visit  Depression Screen PHQ 2/9 Scores 11/20/2019 09/29/2019 06/22/2019 02/26/2019  PHQ - 2 Score 2 0 0 0  PHQ- 9 Score 2 0 0 0     Cognitive Function    Patient is alert and oriented x3. Patient denies difficulty focusing or concentrating. Patient likes to read and write letters which brain health.     6CIT Screen 11/20/2019 11/19/2018 05/29/2017  What Year? 0 points 0 points 0 points  What month? 0 points 0  points 0 points  What time? 0 points 0 points 0 points  Count back from 20 0 points 0 points 0 points  Months in reverse 0 points 0 points 0 points  Repeat phrase 2 points 0 points 0 points  Total Score 2 0 0    Immunization History  Administered Date(s) Administered  . Fluad Quad(high Dose 65+) 02/26/2019  . Influenza, High Dose Seasonal PF 04/17/2017, 04/16/2018  . Influenza,inj,Quad PF,6+ Mos 04/02/2013, 04/07/2014, 03/15/2015, 02/03/2016  . Pneumococcal Conjugate-13 01/11/2015  . Pneumococcal Polysaccharide-23 01/14/2017   Screening Tests Health Maintenance  Topic Date Due  . TETANUS/TDAP  Never done  . MAMMOGRAM  02/07/2019  . COVID-19 Vaccine (1) 12/06/2019 (Originally 11/17/1962)  . INFLUENZA VACCINE  01/10/2020  . COLONOSCOPY  11/16/2021  . DEXA SCAN  Completed  . Hepatitis C Screening  Completed  . PNA vac Low Risk Adult  Completed      Plan:   Keep all routine maintenance appointments.   Follow up 12/17/19 @ 11:00  Medicare Attestation I have personally reviewed: The patient's medical and social history Their use of alcohol, tobacco or illicit drugs Their current medications and supplements The patient's functional ability including ADLs,fall risks, home safety risks, cognitive, and hearing and visual impairment Diet and physical activities Evidence  for depression   I have reviewed and discussed with patient certain preventive protocols, quality metrics, and best practice recommendations.     Varney Biles, LPN  0/[id number]   Reviewed above information.  Agree with assessment and plan.  Following blood pressure and adjusting medication.   Dr Nicki Reaper

## 2019-11-23 ENCOUNTER — Encounter: Payer: Self-pay | Admitting: Gastroenterology

## 2019-11-24 ENCOUNTER — Encounter: Payer: Self-pay | Admitting: Nurse Practitioner

## 2019-11-24 ENCOUNTER — Other Ambulatory Visit: Payer: Self-pay

## 2019-11-24 ENCOUNTER — Ambulatory Visit (INDEPENDENT_AMBULATORY_CARE_PROVIDER_SITE_OTHER): Payer: Medicare Other | Admitting: Nurse Practitioner

## 2019-11-24 VITALS — BP 124/78 | HR 74 | Temp 97.2°F | Resp 16 | Ht 63.0 in | Wt 207.4 lb

## 2019-11-24 DIAGNOSIS — R300 Dysuria: Secondary | ICD-10-CM

## 2019-11-24 DIAGNOSIS — N3000 Acute cystitis without hematuria: Secondary | ICD-10-CM

## 2019-11-24 LAB — POCT URINALYSIS DIPSTICK
Bilirubin, UA: NEGATIVE
Blood, UA: POSITIVE
Glucose, UA: NEGATIVE
Ketones, UA: POSITIVE
Nitrite, UA: POSITIVE
Protein, UA: POSITIVE — AB
Spec Grav, UA: 1.015 (ref 1.010–1.025)
Urobilinogen, UA: NEGATIVE E.U./dL — AB
pH, UA: 7 (ref 5.0–8.0)

## 2019-11-24 LAB — URINALYSIS, MICROSCOPIC ONLY

## 2019-11-24 MED ORDER — SULFAMETHOXAZOLE-TRIMETHOPRIM 800-160 MG PO TABS: 1.0000 | ORAL_TABLET | Freq: Two times a day (BID) | ORAL | 0 refills | Status: AC

## 2019-11-24 NOTE — Progress Notes (Signed)
yellow 

## 2019-11-24 NOTE — Telephone Encounter (Signed)
Opened in error

## 2019-11-24 NOTE — Progress Notes (Signed)
Established Patient Office Visit  Subjective:  Patient ID: Victoria Hanson, female    DOB: 1950/12/19  Age: 69 y.o. MRN: [id number]  CC:  Chief Complaint  Patient presents with  . Acute Visit    UTI    HPI Victoria Hanson presents for a 2 week hx of dysuria an frequency every few minutes. No blood seen.  She has taken no medications for her urinary symptoms.  She denies any fevers or chills or back pain.  She denies nausea vomiting and reports good energy.  She does not feel ill.  Patient reports she normally takes Bactrim and can tolerate sulfa medications well without rash.     Past Medical History:  Diagnosis Date  . Anxiety   . Asthma   . Atypical chest pain    a. 2003 Cath: reportedly nl per pt [name]);  b. 04/2015 [place] Admission, neg troponin.  . Colon cancer ([place]) 1999   a. 1999: Pt had partial colectomy. Did develop lung metastasis. Had right upper lobectomy in 01/2007 then had associated chemotherapy.  Marland Kitchen COPD (chronic obstructive pulmonary disease) ([place])   . DDD (degenerative disc disease), lumbosacral   . Depression   . Echocardiogram abnormal    a. 02/2010 Echo: EF 55-60%, mild LAE, no regional wall motion abnormalities  . Elevated transaminase level    a. ? NASH;  b. 06/2015 - nl LFTs.  . GERD (gastroesophageal reflux disease)   . Hematuria   . Holter monitor, abnormal    a. 12/2009 Holter monitoring: NSR, rare PACs and PVCs.  Marland Kitchen Hypercholesterolemia   . Hyperlipidemia   . Hypertension   . Leaky heart valve    a. Per pt report - no evidence of valvular abnormalities on prior echoes.  . Lesion of skin of Right breast 10/20/2017  . Lung metastases ([place]) 2008   a. S/P resection (lobectomy - right)  . Morbid obesity ([place])   . Nephrolithiasis   . Parkinson disease ([place]) 08/2016   [name]    Past Surgical History:  Procedure Laterality Date  . BREAST EXCISIONAL BIOPSY Right 10/07/2009   benign  . CARDIAC CATHETERIZATION  2003   negative as per pt report    . CHOLECYSTECTOMY  2000  . COLON SURGERY     Colon cancer, removed part of large colon  . COLONOSCOPY N/A 04/25/2015   Procedure: COLONOSCOPY;  Surgeon: Manya Silvas, MD;  Location: [facility] ENDOSCOPY;  Service: Endoscopy;  Laterality: N/A;  . COLONOSCOPY WITH PROPOFOL N/A 06/14/2017   Procedure: COLONOSCOPY WITH PROPOFOL;  Surgeon: Manya Silvas, MD;  Location: [facility] ENDOSCOPY;  Service: Endoscopy;  Laterality: N/A;  . COLONOSCOPY WITH PROPOFOL N/A 11/17/2019   Procedure: COLONOSCOPY WITH PROPOFOL;  Surgeon: [name], MD;  Location: [facility] ENDOSCOPY;  Service: Gastroenterology;  Laterality: N/A;  . LOBECTOMY  01/2007   Right lung, upper lobe right side removed  . TOTAL ABDOMINAL HYSTERECTOMY     abnormal bleeding  . TUBAL LIGATION  1980    Family History  Problem Relation Age of Onset  . Coronary artery disease Mother        died @ 42 of MI.  . Mental illness Mother   . Diabetes Mother   . Heart attack Father 70       MI  . Cancer Father        lung - died in his mid-70's  . Hypertension Father   . Diabetes Father   . Sudden death Brother   .  Mental illness Brother   . Heart attack Brother        MI @ 24, died @ age 74.  . Breast cancer Neg Hx     Social History   Socioeconomic History  . Marital status: Widowed    Spouse name: Not on file  . Number of children: Not on file  . Years of education: Not on file  . Highest education level: Not on file  Occupational History  . Not on file  Tobacco Use  . Smoking status: Passive Smoke Exposure - Never Smoker  . Smokeless tobacco: Never Used  . Tobacco comment: husband and son smoked in her home.  Vaping Use  . Vaping Use: Never used  Substance and Sexual Activity  . Alcohol use: Not Currently    Alcohol/week: 0.0 standard drinks  . Drug use: No  . Sexual activity: Never  Other Topics Concern  . Not on file  Social History Narrative   Widowed.   Disabled (colon/lung CA), retired.   Lives in [place] with  her dtr, grand-dtr, and son.   Active.   Gets regular exercise, walks.   Social Determinants of Health   Financial Resource Strain:   . Difficulty of Paying Living Expenses:   Food Insecurity:   . Worried About Charity fundraiser in the Last Year:   . Arboriculturist in the Last Year:   Transportation Needs:   . Film/video editor (Medical):   Marland Kitchen Lack of Transportation (Non-Medical):   Physical Activity:   . Days of Exercise per Week:   . Minutes of Exercise per Session:   Stress:   . Feeling of Stress :   Social Connections:   . Frequency of Communication with Friends and Family:   . Frequency of Social Gatherings with Friends and Family:   . Attends Religious Services:   . Active Member of Clubs or Organizations:   . Attends Archivist Meetings:   Marland Kitchen Marital Status:   Intimate Partner Violence:   . Fear of Current or Ex-Partner:   . Emotionally Abused:   Marland Kitchen Physically Abused:   . Sexually Abused:     Outpatient Medications Prior to Visit  Medication Sig Dispense Refill  . ALPRAZolam (XANAX) 0.5 MG tablet TAKE 1/2 TABLET BY MOUTH DAILY AS NEEDED 30 tablet 0  . amLODipine (NORVASC) 2.5 MG tablet Take 2.5 mg by mouth daily.    Marland Kitchen aspirin EC 81 MG tablet Take 81 mg by mouth as needed.     . calcium-vitamin D (OSCAL WITH D) 500-200 MG-UNIT tablet Take 1 tablet by mouth.    . carbidopa-levodopa (SINEMET) 25-100 MG tablet Take 1.5 in am and one at 11:00 and one in afternoon and 1.5 at night. 150 tablet 0  . citalopram (CELEXA) 10 MG tablet Taking 1 and half tablet daily. 90 tablet 1  . fluticasone-salmeterol (ADVAIR HFA) 115-21 MCG/ACT inhaler Inhale 2 puffs into the lungs 2 (two) times daily. 1 Inhaler 4  . omeprazole (PRILOSEC) 20 MG capsule Take 1 capsule (20 mg total) by mouth 2 (two) times daily before a meal. 180 capsule 1  . PROAIR HFA 108 (90 Base) MCG/ACT inhaler INHALE 2 PUFFS BY MOUTH INTO THE LUNGS EVERY 6 HOURS AS NEEDED FOR WHEEZING 8.5 g 2  .  rosuvastatin (CRESTOR) 5 MG tablet TAKE 1 TABLET BY MOUTH ON MONDAY, WEDNESDAY AND FRIDAY 13 tablet 1  . traZODone (DESYREL) 50 MG tablet TAKE 2 TABLETS BY MOUTH  AT BEDTIME 180 tablet 1  . vitamin B-12 (CYANOCOBALAMIN) 100 MCG tablet Take 100 mcg by mouth daily.    . ferrous sulfate 325 (65 FE) MG tablet Take 325 mg by mouth daily with breakfast. (Patient not taking: Reported on 11/24/2019)     No facility-administered medications prior to visit.    Allergies  Allergen Reactions  . Macrobid [Nitrofurantoin Macrocrystal] Shortness Of Breath and Palpitations  . Antihistamines, Diphenhydramine-Type Other (See Comments)    She does not know which antihistamine she has an allergic reaction to.  . Aspirin Other (See Comments)    Makes her sick on the stomach if she takes too many and causes bleeding.  Of note, she can take ibuprofen. Other reaction(s): Other (See Comments) Makes her sick on the stomach.Of note, she can take ibuprofen. Makes her sick on the stomach. Of note, she can take ibuprofen.  . Ceftin [Cefuroxime Axetil] Other (See Comments)    Chest pains  . Celecoxib Other (See Comments)    Reaction: Blood Disorder GI bleeding.  Marland Kitchen Cymbalta [Duloxetine Hcl] Other (See Comments)    Reaction: unknown  . Diphenhydramine Other (See Comments)  . Escitalopram Other (See Comments)  . Lexapro [Escitalopram Oxalate] Other (See Comments)    Reaction: Difficulty with speech  . Metronidazole Other (See Comments)    Other reaction(s): Unknown  . Nitrofurantoin Other (See Comments)  . Zoloft [Sertraline Hcl]   . Ciprofloxacin Other (See Comments)    Reaction: unknown    ROS Review of Systems  Constitutional: Negative for chills, fever and unexpected weight change.  HENT: Negative for congestion.   Respiratory: Negative for cough and shortness of breath.   Cardiovascular: Negative for chest pain.  Gastrointestinal: Positive for abdominal pain. Negative for nausea and vomiting.        She has a chronic intermittent discomfort that comes and goes right groin area. This has been present for years.   Genitourinary: Positive for dysuria, frequency and urgency. Negative for flank pain, genital sores, hematuria, pelvic pain and vaginal bleeding.  Musculoskeletal: Negative for back pain.  Neurological: Negative for dizziness.      Objective:    Physical Exam Vitals reviewed.  Constitutional:      Appearance: Normal appearance.  Cardiovascular:     Rate and Rhythm: Normal rate and regular rhythm.     Pulses: Normal pulses.     Heart sounds: Normal heart sounds.  Pulmonary:     Effort: Pulmonary effort is normal.     Breath sounds: Normal breath sounds.  Abdominal:     Palpations: Abdomen is soft.     Tenderness: There is no abdominal tenderness.  Musculoskeletal:        General: Normal range of motion.  Skin:    General: Skin is warm and dry.  Neurological:     General: No focal deficit present.     Mental Status: She is alert and oriented to person, place, and time.  Psychiatric:        Mood and Affect: Mood normal.        Behavior: Behavior normal.     BP 124/78 (BP Location: Left Arm, Patient Position: Sitting, Cuff Size: Normal)   Pulse 74   Temp (!) 97.2 F (36.2 C) (Temporal)   Resp 16   Ht 5\' 3"  (1.6 m)   Wt 207 lb 6.4 oz (94.1 kg)   SpO2 98%   BMI 36.74 kg/m  Wt Readings from Last 3 Encounters:  11/24/19 207 lb 6.4  oz (94.1 kg)  11/20/19 210 lb (95.3 kg)  11/17/19 210 lb (95.3 kg)     Health Maintenance Due  Topic Date Due  . TETANUS/TDAP  Never done  . MAMMOGRAM  02/07/2019    There are no preventive care reminders to display for this patient.  Lab Results  Component Value Date   TSH 2.26 11/11/2019   Lab Results  Component Value Date   WBC 9.5 11/11/2019   HGB 12.0 11/11/2019   HCT 36.0 11/11/2019   MCV 87.9 11/11/2019   PLT 247.0 11/11/2019   Lab Results  Component Value Date   NA 139 11/11/2019   K 3.9 11/11/2019    CO2 28 11/11/2019   GLUCOSE 93 11/11/2019   BUN 15 11/11/2019   CREATININE 1.15 11/11/2019   BILITOT 0.3 10/07/2019   ALKPHOS 75 10/07/2019   AST 15 10/07/2019   ALT 4 10/07/2019   PROT 6.6 10/07/2019   ALBUMIN 3.8 10/07/2019   CALCIUM 9.1 11/11/2019   ANIONGAP 9 11/01/2019   GFR 46.65 (L) 11/11/2019   Lab Results  Component Value Date   CHOL 161 10/07/2019   Lab Results  Component Value Date   HDL 54.10 10/07/2019   Lab Results  Component Value Date   LDLCALC 87 10/07/2019   Lab Results  Component Value Date   TRIG 96.0 10/07/2019   Lab Results  Component Value Date   CHOLHDL 3 10/07/2019   Lab Results  Component Value Date   HGBA1C 5.9 10/07/2019      Assessment & Plan:   Problem List Items Addressed This Visit      Other   Dysuria - Primary   Relevant Orders   POCT Urinalysis Dipstick (Completed)   Urine Culture   Urine Microscopic Only (Completed)    Other Visit Diagnoses    Acute cystitis without hematuria          Meds ordered this encounter  Medications  . sulfamethoxazole-trimethoprim (BACTRIM DS) 800-160 MG tablet    Sig: Take 1 tablet by mouth 2 (two) times daily for 5 days.    Dispense:  10 tablet    Refill:  0    Order Specific Question:   Supervising Provider    Answer:   Einar Pheasant [[id number]]   I have sent off a urine culture.  The results will take about 3 days to return.  In the meantime, begin Bactrim. You have  taken this in the past and it seems to be well for your urinary tract infections.  Stop medication and call back if you develop a rash, or any side effects from medication.  You may try some over-the-counter Azo, to help decrease bladder spasms and urinary frequency.  You may take a few doses of  this for 1 to 2 days .You should no longer need it after 1- 2 days as the Bactrim should control all of your symptoms.  Call us back if not getting better.  Hydrate well, avoid colas, teas, carbonated beverages.    Monitor  for worsening symptoms such as nausea vomiting, fevers or chills, or back pain.  If symptoms worsen, seek care in the emergency department.   Follow-up: No follow-ups on file.    Denice Paradise, NP

## 2019-11-24 NOTE — Patient Instructions (Addendum)
I have sent off a urine culture.  The results will take about 3 days to return.  In the meantime, begin Bactrim. You have  taken this in the past and it seems to be well for your urinary tract infections.  Stop medication and call back if you develop a rash, or any side effects from medication.  You may try some over-the-counter Azo, to help decrease bladder spasms and urinary frequency.  You may take a few doses of  this for 1 to 2 days .You should no longer need it after 1- 2 days as the Bactrim should control all of your symptoms.  Call us back if not getting better.  Hydrate well, avoid colas, teas, carbonated beverages.    Monitor for worsening symptoms such as nausea vomiting, fevers or chills, or back pain.  If symptoms worsen, seek care in the emergency department.   Dysuria Dysuria is pain or discomfort while urinating. The pain or discomfort may be felt in the part of your body that drains urine from the bladder (urethra) or in the surrounding tissue of the genitals. The pain may also be felt in the groin area, lower abdomen, or lower back. You may have to urinate frequently or have the sudden feeling that you have to urinate (urgency). Dysuria can affect both men and women, but it is more common in women. Dysuria can be caused by many different things, including:  Urinary tract infection.  Kidney stones or bladder stones.  Certain sexually transmitted infections (STIs), such as chlamydia.  Dehydration.  Inflammation of the tissues of the vagina.  Use of certain medicines.  Use of certain soaps or scented products that cause irritation. Follow these instructions at home: General instructions  Watch your condition for any changes.  Urinate often. Avoid holding urine for long periods of time.  After a bowel movement or urination, women should cleanse from front to back, using each tissue only once.  Urinate after sexual intercourse.  Keep all follow-up visits as told by  your health care provider. This is important.  If you had any tests done to find the cause of dysuria, it is up to you to get your test results. Ask your health care provider, or the department that is doing the test, when your results will be ready. Eating and drinking   Drink enough fluid to keep your urine pale yellow.  Avoid caffeine, tea, and alcohol. They can irritate the bladder and make dysuria worse. In men, alcohol may irritate the prostate. Medicines  Take over-the-counter and prescription medicines only as told by your health care provider.  If you were prescribed an antibiotic medicine, take it as told by your health care provider. Do not stop taking the antibiotic even if you start to feel better. Contact a health care provider if:  You have a fever.  You develop pain in your back or sides.  You have nausea or vomiting.  You have blood in your urine.  You are not urinating as often as you usually do. Get help right away if:  Your pain is severe and not relieved with medicines.  You cannot eat or drink without vomiting.  You are confused.  You have a rapid heartbeat while at rest.  You have shaking or chills.  You feel extremely weak. Summary  Dysuria is pain or discomfort while urinating. Many different conditions can lead to dysuria.  If you have dysuria, you may have to urinate frequently or have the sudden feeling  that you have to urinate (urgency).  Watch your condition for any changes. Keep all follow-up visits as told by your health care provider.  Make sure that you urinate often and drink enough fluid to keep your urine pale yellow. This information is not intended to replace advice given to you by your health care provider. Make sure you discuss any questions you have with your health care provider. Document Revised: 05/10/2017 Document Reviewed: 03/14/2017 Elsevier Patient Education  [place].  Dysuria Dysuria is pain or discomfort  while urinating. The pain or discomfort may be felt in the part of your body that drains urine from the bladder (urethra) or in the surrounding tissue of the genitals. The pain may also be felt in the groin area, lower abdomen, or lower back. You may have to urinate frequently or have the sudden feeling that you have to urinate (urgency). Dysuria can affect both men and women, but it is more common in women. Dysuria can be caused by many different things, including:  Urinary tract infection.  Kidney stones or bladder stones.  Certain sexually transmitted infections (STIs), such as chlamydia.  Dehydration.  Inflammation of the tissues of the vagina.  Use of certain medicines.  Use of certain soaps or scented products that cause irritation. Follow these instructions at home: General instructions  Watch your condition for any changes.  Urinate often. Avoid holding urine for long periods of time.  After a bowel movement or urination, women should cleanse from front to back, using each tissue only once.  Urinate after sexual intercourse.  Keep all follow-up visits as told by your health care provider. This is important.  If you had any tests done to find the cause of dysuria, it is up to you to get your test results. Ask your health care provider, or the department that is doing the test, when your results will be ready. Eating and drinking   Drink enough fluid to keep your urine pale yellow.  Avoid caffeine, tea, and alcohol. They can irritate the bladder and make dysuria worse. In men, alcohol may irritate the prostate. Medicines  Take over-the-counter and prescription medicines only as told by your health care provider.  If you were prescribed an antibiotic medicine, take it as told by your health care provider. Do not stop taking the antibiotic even if you start to feel better. Contact a health care provider if:  You have a fever.  You develop pain in your back or  sides.  You have nausea or vomiting.  You have blood in your urine.  You are not urinating as often as you usually do. Get help right away if:  Your pain is severe and not relieved with medicines.  You cannot eat or drink without vomiting.  You are confused.  You have a rapid heartbeat while at rest.  You have shaking or chills.  You feel extremely weak. Summary  Dysuria is pain or discomfort while urinating. Many different conditions can lead to dysuria.  If you have dysuria, you may have to urinate frequently or have the sudden feeling that you have to urinate (urgency).  Watch your condition for any changes. Keep all follow-up visits as told by your health care provider.  Make sure that you urinate often and drink enough fluid to keep your urine pale yellow. This information is not intended to replace advice given to you by your health care provider. Make sure you discuss any questions you have with your health  care provider. Document Revised: 05/10/2017 Document Reviewed: 03/14/2017 Elsevier Patient Education  [place].

## 2019-11-25 ENCOUNTER — Other Ambulatory Visit: Payer: Self-pay | Admitting: Nurse Practitioner

## 2019-11-25 ENCOUNTER — Telehealth: Payer: Self-pay | Admitting: Internal Medicine

## 2019-11-25 NOTE — Telephone Encounter (Signed)
Pt states that the sulfamethoxazole-trimethoprim (BACTRIM DS) 800-160 MG tablet is making her feel sick and "terrible". Please call back to advise

## 2019-11-25 NOTE — Telephone Encounter (Signed)
Pt saw you yesterday for UTI, were you referring her to someone?

## 2019-11-25 NOTE — Telephone Encounter (Signed)
I spoke to Victoria Hanson today.  She is off of her caffeine products and feeling sluggish.  She also has constipation.  As far as the Bactrim, she has had no rash, nausea, or vomiting,  and her urinary symptoms have markedly improved.   Plan: Patient may have caffeine in the form of her beverage of choice. She is no longer having any urgency, frequency, or burning.  She also stool softener and MiraLAX to take for constipation and is thinking about doing that. We have discussed that it does not seem that Bactrim is causing her any problems. It seems to be  helping to treat her urinary tract infection. She is going to keep on the Bactrim medication at this time.  She will call back for follow-up if no improvement.

## 2019-11-25 NOTE — Telephone Encounter (Signed)
Patient wants to know if there is something else that can be sent in? The bactrim DS is making her sick to her stomach and feeling awful.

## 2019-11-25 NOTE — Telephone Encounter (Signed)
I sent off a urine culture and am waiting for that to come back. I told her that today. No referrals from me.

## 2019-11-25 NOTE — Telephone Encounter (Signed)
Pt called wanted to know about referral.

## 2019-11-26 LAB — URINE CULTURE
MICRO NUMBER:: 10592864
SPECIMEN QUALITY:: ADEQUATE

## 2019-11-27 NOTE — Telephone Encounter (Signed)
LMTCB

## 2019-11-28 IMAGING — CR DG CHEST 2V
2 series · 2 of 2 positions shown · non-contrast
Comparison: September 24, 2016 chest radiograph and chest CT March 07, 2018

CLINICAL DATA: Hypertension.  Blurred vision and headache

EXAM:
CHEST - 2 VIEW

[chest pa]
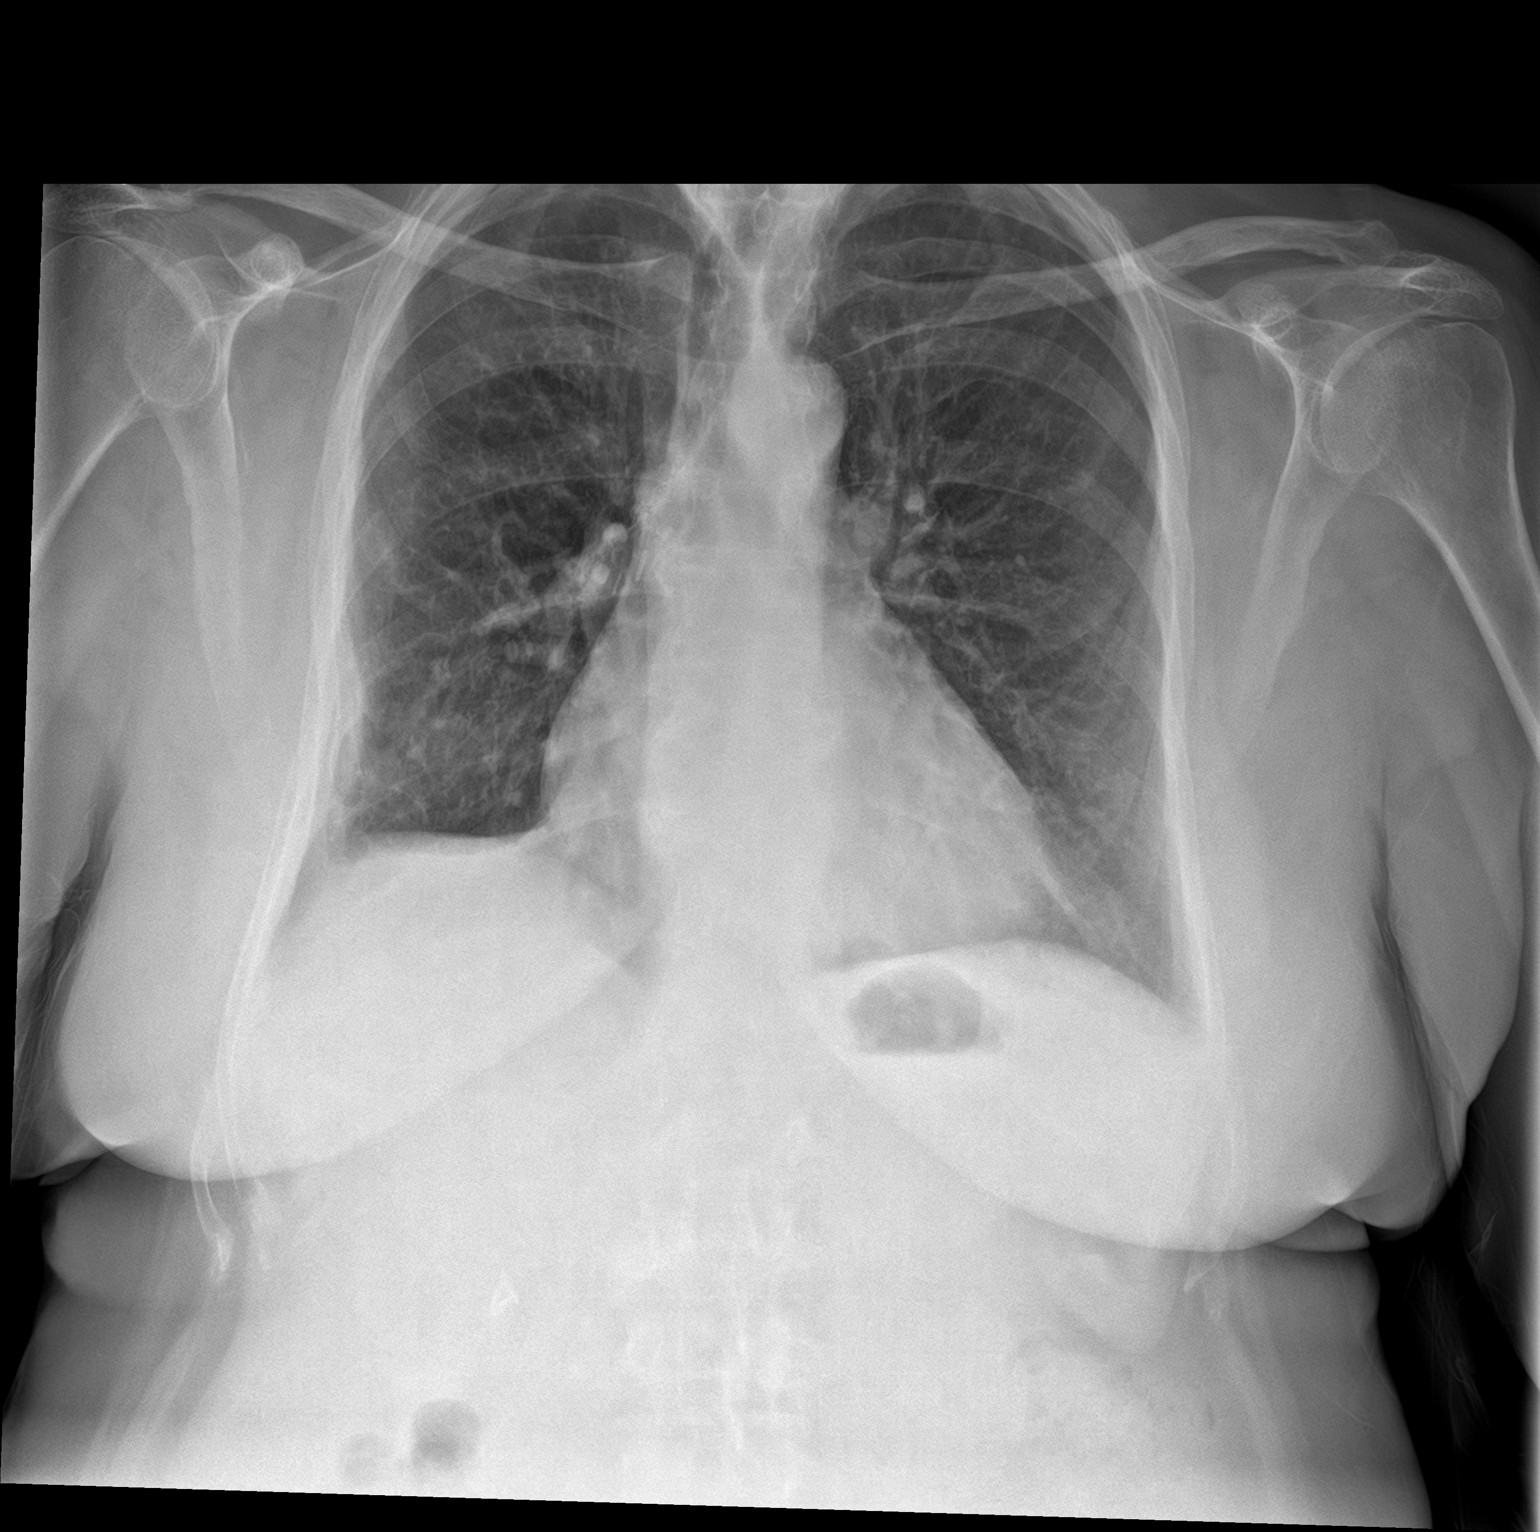

[chest lat]
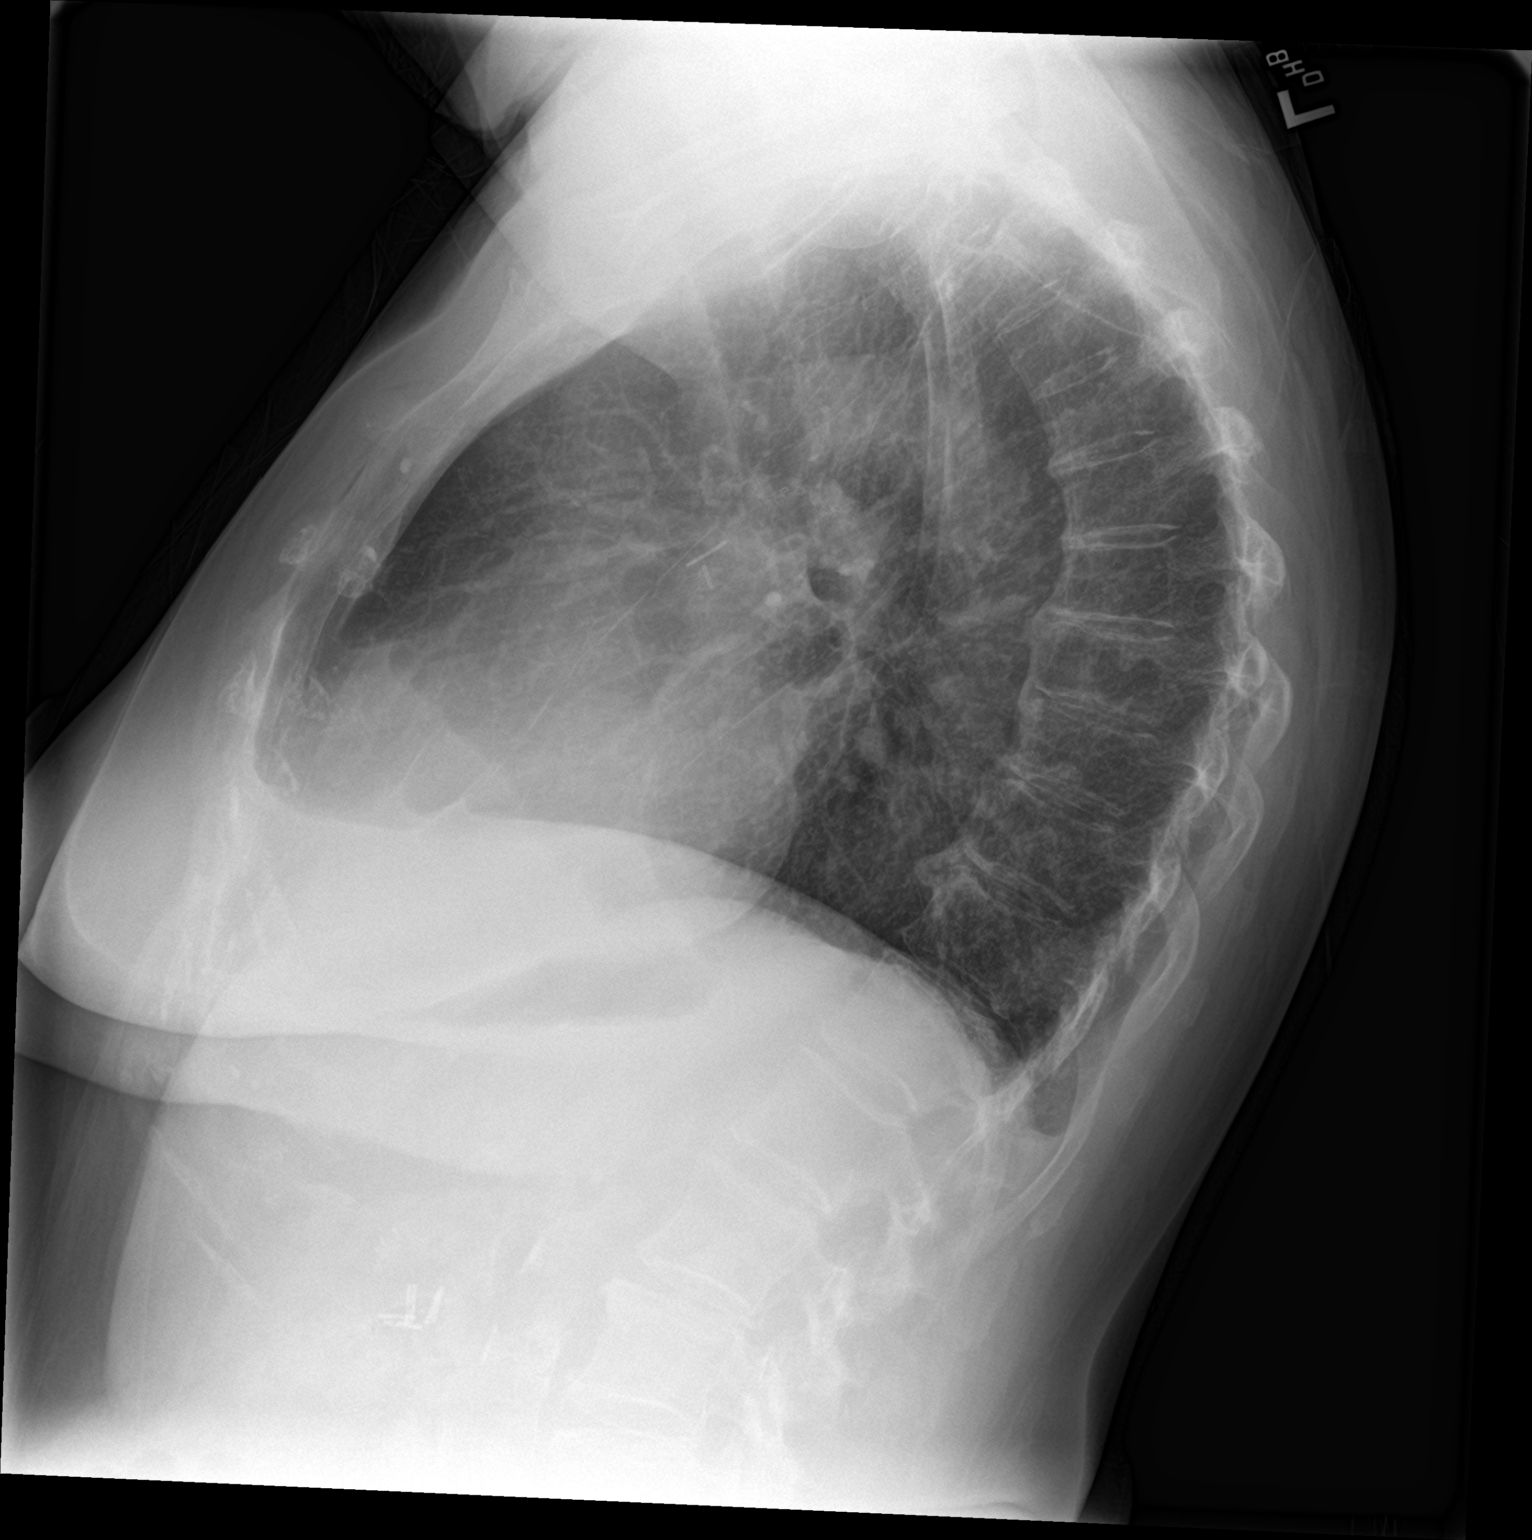

[2 of 2 positions shown; findings below may reference images not displayed]

FINDINGS: There is no edema or consolidation. Heart size and pulmonary
vascularity are normal. There are surgical clips in the right hilar
region. There is degenerative change in the thoracic spine. There is
aortic atherosclerosis.
IMPRESSION: No edema or consolidation. Postoperative change on the right. There
is aortic atherosclerosis.

Aortic Atherosclerosis ([id number]).

## 2019-11-30 NOTE — Telephone Encounter (Signed)
Patient aware.

## 2019-12-02 ENCOUNTER — Ambulatory Visit: Payer: Medicare Other | Admitting: Pulmonary Disease

## 2019-12-17 ENCOUNTER — Other Ambulatory Visit: Payer: Self-pay

## 2019-12-17 ENCOUNTER — Ambulatory Visit (INDEPENDENT_AMBULATORY_CARE_PROVIDER_SITE_OTHER): Payer: Medicare Other | Admitting: Internal Medicine

## 2019-12-17 DIAGNOSIS — R739 Hyperglycemia, unspecified: Secondary | ICD-10-CM

## 2019-12-17 DIAGNOSIS — Z87440 Personal history of urinary (tract) infections: Secondary | ICD-10-CM

## 2019-12-17 DIAGNOSIS — Z8589 Personal history of malignant neoplasm of other organs and systems: Secondary | ICD-10-CM

## 2019-12-17 DIAGNOSIS — R251 Tremor, unspecified: Secondary | ICD-10-CM

## 2019-12-17 DIAGNOSIS — I10 Essential (primary) hypertension: Secondary | ICD-10-CM

## 2019-12-17 DIAGNOSIS — R7989 Other specified abnormal findings of blood chemistry: Secondary | ICD-10-CM

## 2019-12-17 DIAGNOSIS — K219 Gastro-esophageal reflux disease without esophagitis: Secondary | ICD-10-CM

## 2019-12-17 DIAGNOSIS — E7800 Pure hypercholesterolemia, unspecified: Secondary | ICD-10-CM

## 2019-12-17 DIAGNOSIS — D649 Anemia, unspecified: Secondary | ICD-10-CM | POA: Diagnosis not present

## 2019-12-17 DIAGNOSIS — E782 Mixed hyperlipidemia: Secondary | ICD-10-CM | POA: Diagnosis not present

## 2019-12-17 DIAGNOSIS — M7989 Other specified soft tissue disorders: Secondary | ICD-10-CM

## 2019-12-17 DIAGNOSIS — Z85038 Personal history of other malignant neoplasm of large intestine: Secondary | ICD-10-CM

## 2019-12-17 DIAGNOSIS — J449 Chronic obstructive pulmonary disease, unspecified: Secondary | ICD-10-CM

## 2019-12-17 DIAGNOSIS — G20 Parkinson's disease: Secondary | ICD-10-CM

## 2019-12-17 DIAGNOSIS — N183 Chronic kidney disease, stage 3 unspecified: Secondary | ICD-10-CM

## 2019-12-17 DIAGNOSIS — Z85118 Personal history of other malignant neoplasm of bronchus and lung: Secondary | ICD-10-CM

## 2019-12-17 DIAGNOSIS — F329 Major depressive disorder, single episode, unspecified: Secondary | ICD-10-CM

## 2019-12-17 LAB — HEPATIC FUNCTION PANEL
ALT: 3 U/L (ref 0–35)
AST: 12 U/L (ref 0–37)
Albumin: 3.9 g/dL (ref 3.5–5.2)
Alkaline Phosphatase: 82 U/L (ref 39–117)
Bilirubin, Direct: 0.1 mg/dL (ref 0.0–0.3)
Total Bilirubin: 0.3 mg/dL (ref 0.2–1.2)
Total Protein: 6.3 g/dL (ref 6.0–8.3)

## 2019-12-17 LAB — BASIC METABOLIC PANEL
BUN: 16 mg/dL (ref 6–23)
CO2: 27 mEq/L (ref 19–32)
Calcium: 9.1 mg/dL (ref 8.4–10.5)
Chloride: 107 mEq/L (ref 96–112)
Creatinine, Ser: 1.12 mg/dL (ref 0.40–1.20)
GFR: 48.08 mL/min — ABNORMAL LOW (ref >60.00)
Glucose, Bld: 104 mg/dL — ABNORMAL HIGH (ref 70–99)
Potassium: 4.1 mEq/L (ref 3.5–5.1)
Sodium: 141 mEq/L (ref 135–145)

## 2019-12-17 LAB — URINALYSIS, ROUTINE W REFLEX MICROSCOPIC
Bilirubin Urine: NEGATIVE
Ketones, ur: NEGATIVE
Nitrite: NEGATIVE
Specific Gravity, Urine: 1.015 (ref 1.000–1.030)
Total Protein, Urine: NEGATIVE
Urine Glucose: NEGATIVE
Urobilinogen, UA: 0.2 (ref 0.0–1.0)
pH: 6 (ref 5.0–8.0)

## 2019-12-17 LAB — CBC WITH DIFFERENTIAL/PLATELET
Basophils Absolute: 0 10*3/uL (ref 0.0–0.1)
Basophils Relative: 0.5 % (ref 0.0–3.0)
Eosinophils Absolute: 0.2 10*3/uL (ref 0.0–0.7)
Eosinophils Relative: 1.7 % (ref 0.0–5.0)
HCT: 35.1 % — ABNORMAL LOW (ref 36.0–46.0)
Hemoglobin: 11.7 g/dL — ABNORMAL LOW (ref 12.0–15.0)
Lymphocytes Relative: 20 % (ref 12.0–46.0)
Lymphs Abs: 1.8 10*3/uL (ref 0.7–4.0)
MCHC: 33.2 g/dL (ref 30.0–36.0)
MCV: 87.7 fl (ref 78.0–100.0)
Monocytes Absolute: 0.5 10*3/uL (ref 0.1–1.0)
Monocytes Relative: 5.7 % (ref 3.0–12.0)
Neutro Abs: 6.6 10*3/uL (ref 1.4–7.7)
Neutrophils Relative %: 72.1 % (ref 43.0–77.0)
Platelets: 225 10*3/uL (ref 150.0–400.0)
RBC: 4 Mil/uL (ref 3.87–5.11)
RDW: 13.9 % (ref 11.5–15.5)
WBC: 9.2 10*3/uL (ref 4.0–10.5)

## 2019-12-17 LAB — VITAMIN B12: Vitamin B-12: 1109 pg/mL — ABNORMAL HIGH (ref 211–911)

## 2019-12-17 LAB — IBC + FERRITIN
Ferritin: 35.6 ng/mL (ref 10.0–291.0)
Iron: 54 ug/dL (ref 42–145)
Saturation Ratios: 16.9 % — ABNORMAL LOW (ref 20.0–50.0)
Transferrin: 228 mg/dL (ref 212.0–360.0)

## 2019-12-17 LAB — TSH: TSH: 1.86 u[IU]/mL (ref 0.35–4.50)

## 2019-12-17 NOTE — Progress Notes (Signed)
Patient ID: Victoria Hanson, female   DOB: 1951/05/11, 69 y.o.   MRN: [id number]   Subjective:    Patient ID: Victoria Hanson, female    DOB: 1950/12/23, 69 y.o.   MRN: [id number]  HPI This visit occurred during the SARS-CoV-2 public health emergency.  Safety protocols were in place, including screening questions prior to the visit, additional usage of staff PPE, and extensive cleaning of exam room while observing appropriate contact time as indicated for disinfecting solutions.  Patient here for a scheduled follow up.  She reports she is doing relatively well.  Seeing neurology for f/u regarding her parkinsons.  Started on amantidine.  Also taking sinemet.  Tries to say active.  No chest pain.  Breathing stable.  No increased cough or congestion.  Wants to f/u with Dr Raul Del.  Back on advair.  proair prn.  She is walking some.  Blood pressure doing better.  On amlodipine.  No abdominal pain.  Bowels moving.   Past Medical History:  Diagnosis Date  . Anxiety   . Asthma   . Atypical chest pain    a. 2003 Cath: reportedly nl per pt [name]);  b. 04/2015 [place] Admission, neg troponin.  . Colon cancer ([place]) 1999   a. 1999: Pt had partial colectomy. Did develop lung metastasis. Had right upper lobectomy in 01/2007 then had associated chemotherapy.  Marland Kitchen COPD (chronic obstructive pulmonary disease) ([place])   . DDD (degenerative disc disease), lumbosacral   . Depression   . Echocardiogram abnormal    a. 02/2010 Echo: EF 55-60%, mild LAE, no regional wall motion abnormalities  . Elevated transaminase level    a. ? NASH;  b. 06/2015 - nl LFTs.  . GERD (gastroesophageal reflux disease)   . Hematuria   . Holter monitor, abnormal    a. 12/2009 Holter monitoring: NSR, rare PACs and PVCs.  Marland Kitchen Hypercholesterolemia   . Hyperlipidemia   . Hypertension   . Leaky heart valve    a. Per pt report - no evidence of valvular abnormalities on prior echoes.  . Lesion of skin of Right breast 10/20/2017  . Lung  metastases ([place]) 2008   a. S/P resection (lobectomy - right)  . Morbid obesity ([place])   . Nephrolithiasis   . Parkinson disease ([place]) 08/2016   [name]   Past Surgical History:  Procedure Laterality Date  . BREAST EXCISIONAL BIOPSY Right 10/07/2009   benign  . CARDIAC CATHETERIZATION  2003   negative as per pt report  . CHOLECYSTECTOMY  2000  . COLON SURGERY     Colon cancer, removed part of large colon  . COLONOSCOPY N/A 04/25/2015   Procedure: COLONOSCOPY;  Surgeon: Manya Silvas, MD;  Location: [facility] ENDOSCOPY;  Service: Endoscopy;  Laterality: N/A;  . COLONOSCOPY WITH PROPOFOL N/A 06/14/2017   Procedure: COLONOSCOPY WITH PROPOFOL;  Surgeon: Manya Silvas, MD;  Location: [facility] ENDOSCOPY;  Service: Endoscopy;  Laterality: N/A;  . COLONOSCOPY WITH PROPOFOL N/A 11/17/2019   Procedure: COLONOSCOPY WITH PROPOFOL;  Surgeon: [name], MD;  Location: [facility] ENDOSCOPY;  Service: Gastroenterology;  Laterality: N/A;  . LOBECTOMY  01/2007   Right lung, upper lobe right side removed  . TOTAL ABDOMINAL HYSTERECTOMY     abnormal bleeding  . TUBAL LIGATION  1980   Family History  Problem Relation Age of Onset  . Coronary artery disease Mother        died @ 6 of MI.  . Mental illness Mother   . Diabetes Mother   .  Heart attack Father 31       MI  . Cancer Father        lung - died in his mid-70's  . Hypertension Father   . Diabetes Father   . Sudden death Brother   . Mental illness Brother   . Heart attack Brother        MI @ 83, died @ age 38.  . Breast cancer Neg Hx    Social History   Socioeconomic History  . Marital status: Widowed    Spouse name: Not on file  . Number of children: Not on file  . Years of education: Not on file  . Highest education level: Not on file  Occupational History  . Not on file  Tobacco Use  . Smoking status: Passive Smoke Exposure - Never Smoker  . Smokeless tobacco: Never Used  . Tobacco comment: husband and son smoked in her home.    Vaping Use  . Vaping Use: Never used  Substance and Sexual Activity  . Alcohol use: Not Currently    Alcohol/week: 0.0 standard drinks  . Drug use: No  . Sexual activity: Never  Other Topics Concern  . Not on file  Social History Narrative   Widowed.   Disabled (colon/lung CA), retired.   Lives in [place] with her dtr, grand-dtr, and son.   Active.   Gets regular exercise, walks.   Social Determinants of Health   Financial Resource Strain:   . Difficulty of Paying Living Expenses:   Food Insecurity:   . Worried About Charity fundraiser in the Last Year:   . Arboriculturist in the Last Year:   Transportation Needs:   . Film/video editor (Medical):   Marland Kitchen Lack of Transportation (Non-Medical):   Physical Activity:   . Days of Exercise per Week:   . Minutes of Exercise per Session:   Stress:   . Feeling of Stress :   Social Connections:   . Frequency of Communication with Friends and Family:   . Frequency of Social Gatherings with Friends and Family:   . Attends Religious Services:   . Active Member of Clubs or Organizations:   . Attends Archivist Meetings:   Marland Kitchen Marital Status:     Outpatient Encounter Medications as of 12/17/2019  Medication Sig  . ALPRAZolam (XANAX) 0.5 MG tablet TAKE 1/2 TABLET BY MOUTH DAILY AS NEEDED  . amLODipine (NORVASC) 2.5 MG tablet Take 2.5 mg by mouth daily.  Marland Kitchen aspirin EC 81 MG tablet Take 81 mg by mouth as needed.   . calcium-vitamin D (OSCAL WITH D) 500-200 MG-UNIT tablet Take 1 tablet by mouth.  . carbidopa-levodopa (SINEMET) 25-100 MG tablet Take 1.5 in am and one at 11:00 and one in afternoon and 1.5 at night.  . citalopram (CELEXA) 10 MG tablet Taking 1 and half tablet daily.  . ferrous sulfate 325 (65 FE) MG tablet Take 325 mg by mouth daily with breakfast. (Patient not taking: Reported on 11/24/2019)  . fluticasone-salmeterol (ADVAIR HFA) 115-21 MCG/ACT inhaler Inhale 2 puffs into the lungs 2 (two) times daily.  Marland Kitchen  omeprazole (PRILOSEC) 20 MG capsule Take 1 capsule (20 mg total) by mouth 2 (two) times daily before a meal.  . PROAIR HFA 108 (90 Base) MCG/ACT inhaler INHALE 2 PUFFS BY MOUTH INTO THE LUNGS EVERY 6 HOURS AS NEEDED FOR WHEEZING  . rosuvastatin (CRESTOR) 5 MG tablet TAKE 1 TABLET BY MOUTH ON MONDAY, WEDNESDAY AND  FRIDAY  . traZODone (DESYREL) 50 MG tablet TAKE 2 TABLETS BY MOUTH AT BEDTIME  . vitamin B-12 (CYANOCOBALAMIN) 100 MCG tablet Take 100 mcg by mouth daily.   No facility-administered encounter medications on file as of 12/17/2019.    Review of Systems  Constitutional: Negative for appetite change and unexpected weight change.  HENT: Negative for congestion and sinus pressure.   Respiratory: Negative for cough, chest tightness and shortness of breath.        Breathing overall stable.  Using advair twice a day.    Cardiovascular: Negative for chest pain, palpitations and leg swelling.  Gastrointestinal: Negative for abdominal pain, diarrhea, nausea and vomiting.  Genitourinary: Negative for difficulty urinating and dysuria.  Musculoskeletal: Negative for joint swelling and myalgias.  Skin: Negative for color change and rash.  Neurological: Negative for dizziness, light-headedness and headaches.  Psychiatric/Behavioral: Negative for agitation and dysphoric mood.       Increased stress.  Tolerating.        Objective:    Physical Exam Constitutional:      General: She is not in acute distress.    Appearance: Normal appearance.  HENT:     Head: Normocephalic and atraumatic.     Right Ear: External ear normal.     Left Ear: External ear normal.  Eyes:     General: No scleral icterus.       Right eye: No discharge.        Left eye: No discharge.     Conjunctiva/sclera: Conjunctivae normal.  Neck:     Thyroid: No thyromegaly.  Cardiovascular:     Rate and Rhythm: Normal rate and regular rhythm.  Pulmonary:     Effort: No respiratory distress.     Breath sounds: Normal  breath sounds. No wheezing.  Abdominal:     General: Bowel sounds are normal.     Palpations: Abdomen is soft.     Tenderness: There is no abdominal tenderness.  Musculoskeletal:        General: No swelling or tenderness.     Cervical back: Neck supple. No tenderness.  Lymphadenopathy:     Cervical: No cervical adenopathy.  Skin:    Findings: No erythema or rash.  Neurological:     Mental Status: She is alert.  Psychiatric:        Mood and Affect: Mood normal.        Behavior: Behavior normal.     BP 118/78   Pulse 78   Temp 97.7 F (36.5 C)   Resp 16   Ht _0  (1.6 m)   Wt 210 lb (95.3 kg)   SpO2 97%   BMI 37.20 kg/m  Wt Readings from Last 3 Encounters:  12/17/19 210 lb (95.3 kg)  11/24/19 207 lb 6.4 oz (94.1 kg)  11/20/19 210 lb (95.3 kg)     Lab Results  Component Value Date   WBC 9.2 12/17/2019   HGB 11.7 (L) 12/17/2019   HCT 35.1 (L) 12/17/2019   PLT 225.0 12/17/2019   GLUCOSE 104 (H) 12/17/2019   CHOL 161 10/07/2019   TRIG 96.0 10/07/2019   HDL 54.10 10/07/2019   LDLDIRECT 163.4 12/31/2012   LDLCALC 87 10/07/2019   ALT 3 12/17/2019   AST 12 12/17/2019   NA 141 12/17/2019   K 4.1 12/17/2019   CL 107 12/17/2019   CREATININE 1.12 12/17/2019   BUN 16 12/17/2019   CO2 27 12/17/2019   TSH 1.86 12/17/2019   INR 1.0 12/12/2012  HGBA1C 5.9 10/07/2019       Assessment & Plan:   Problem List Items Addressed This Visit    Anemia    Follow cbc.        Relevant Orders   CBC with Differential/Platelet (Completed)   Vitamin B12 (Completed)   IBC + Ferritin (Completed)   Chronic obstructive pulmonary disease (HCC)    Breathing stable.  Request f/u with Dr Raul Del.       Relevant Orders   Ambulatory referral to Pulmonology   CKD (chronic kidney disease), stage III    Avoid antiinflammatories.  Follow metabolic panel.       Elevated liver function tests    Diet and exercise.  Follow liver function tests.        GERD (gastroesophageal reflux  disease)    Controlled on prilosec.       History of colon cancer    Followed by GI.  Colonoscopy 11/17/19.       History of malignant neoplasm metastatic to lung    S/p lobectomy.  Request f/u with Dr Raul Del.        History of UTI   Relevant Orders   Urinalysis, Routine w reflex microscopic (Completed)   Urine Culture (Completed)   Hypercholesterolemia    On crestor.  Low cholesterol diet and exercise.  Follow lipid panel and liver function tests.        Relevant Orders   Hepatic function panel (Completed)   TSH (Completed)   Hyperglycemia    Low carb diet and exercise.  Follow met b and a1c.        Hyperlipidemia    Low cholesterol diet and exercise.  On crestor.  Follow lipid panel and liver function tests.        Hypertension    Blood pressure doing well on amlodipine.  Follow pressures.  Follow metabolic panel.        Relevant Orders   Basic metabolic panel (Completed)   Major depressive disorder with single episode    Stable on citalopram.  Follow.        Parkinson's disease ([place])    Followed by neurology.  Started on amantidine recently.  On sinemet.        Pure hypercholesterolemia    Low cholesterol diet and exercise.  Follow lipid panel.        Swelling of lower extremity    Stable.        Tremor    Followed by neurology.  Recently started amantidine.  On sinemet.           Einar Pheasant, MD

## 2019-12-18 LAB — URINE CULTURE
MICRO NUMBER:: 10680785
Result:: NO GROWTH
SPECIMEN QUALITY:: ADEQUATE

## 2019-12-22 DIAGNOSIS — S81851A Open bite, right lower leg, initial encounter: Secondary | ICD-10-CM | POA: Diagnosis not present

## 2019-12-22 DIAGNOSIS — Z881 Allergy status to other antibiotic agents status: Secondary | ICD-10-CM | POA: Diagnosis not present

## 2019-12-22 DIAGNOSIS — M1711 Unilateral primary osteoarthritis, right knee: Secondary | ICD-10-CM | POA: Diagnosis not present

## 2019-12-22 DIAGNOSIS — M1611 Unilateral primary osteoarthritis, right hip: Secondary | ICD-10-CM | POA: Diagnosis not present

## 2019-12-22 DIAGNOSIS — Z886 Allergy status to analgesic agent status: Secondary | ICD-10-CM | POA: Diagnosis not present

## 2019-12-22 DIAGNOSIS — N189 Chronic kidney disease, unspecified: Secondary | ICD-10-CM | POA: Diagnosis not present

## 2019-12-22 DIAGNOSIS — C7800 Secondary malignant neoplasm of unspecified lung: Secondary | ICD-10-CM | POA: Diagnosis not present

## 2019-12-22 DIAGNOSIS — J449 Chronic obstructive pulmonary disease, unspecified: Secondary | ICD-10-CM | POA: Diagnosis not present

## 2019-12-22 DIAGNOSIS — M25551 Pain in right hip: Secondary | ICD-10-CM | POA: Diagnosis not present

## 2019-12-22 DIAGNOSIS — Z85038 Personal history of other malignant neoplasm of large intestine: Secondary | ICD-10-CM | POA: Diagnosis not present

## 2019-12-22 DIAGNOSIS — M810 Age-related osteoporosis without current pathological fracture: Secondary | ICD-10-CM | POA: Diagnosis not present

## 2019-12-22 DIAGNOSIS — S81801A Unspecified open wound, right lower leg, initial encounter: Secondary | ICD-10-CM | POA: Diagnosis not present

## 2019-12-23 ENCOUNTER — Encounter: Payer: Self-pay | Admitting: Internal Medicine

## 2019-12-23 DIAGNOSIS — M1711 Unilateral primary osteoarthritis, right knee: Secondary | ICD-10-CM | POA: Diagnosis not present

## 2019-12-23 DIAGNOSIS — M1611 Unilateral primary osteoarthritis, right hip: Secondary | ICD-10-CM | POA: Diagnosis not present

## 2019-12-23 NOTE — Assessment & Plan Note (Signed)
On crestor.  Low cholesterol diet and exercise.  Follow lipid panel and liver function tests.   

## 2019-12-23 NOTE — Assessment & Plan Note (Signed)
Low cholesterol diet and exercise.  Follow lipid panel.   

## 2019-12-23 NOTE — Assessment & Plan Note (Signed)
Stable

## 2019-12-23 NOTE — Assessment & Plan Note (Signed)
Low carb diet and exercise.  Follow met b and a1c.   

## 2019-12-23 NOTE — Assessment & Plan Note (Signed)
Followed by GI.  Colonoscopy 11/17/19.

## 2019-12-23 NOTE — Assessment & Plan Note (Signed)
Controlled on prilosec.   

## 2019-12-23 NOTE — Assessment & Plan Note (Signed)
Diet and exercise.  Follow liver function tests.   

## 2019-12-23 NOTE — Assessment & Plan Note (Signed)
Followed by neurology.  Started on amantidine recently.  On sinemet.

## 2019-12-23 NOTE — Assessment & Plan Note (Signed)
Avoid antiinflammatories.  Follow metabolic panel.  

## 2019-12-23 NOTE — Assessment & Plan Note (Signed)
Stable on citalopram.  Follow.

## 2019-12-23 NOTE — Assessment & Plan Note (Signed)
Blood pressure doing well on amlodipine.  Follow pressures.  Follow metabolic panel.   

## 2019-12-23 NOTE — Assessment & Plan Note (Signed)
Followed by neurology.  Recently started amantidine.  On sinemet.

## 2019-12-23 NOTE — Assessment & Plan Note (Signed)
Breathing stable.  Request f/u with Dr Raul Del.

## 2019-12-23 NOTE — Assessment & Plan Note (Signed)
S/p lobectomy.  Request f/u with Dr Raul Del.

## 2019-12-23 NOTE — Assessment & Plan Note (Signed)
Low cholesterol diet and exercise.  On crestor.  Follow lipid panel and liver function tests.  

## 2019-12-23 NOTE — Assessment & Plan Note (Signed)
Follow cbc.  

## 2019-12-25 ENCOUNTER — Other Ambulatory Visit: Payer: Self-pay | Admitting: Internal Medicine

## 2019-12-25 NOTE — Telephone Encounter (Signed)
Sent to pharmacy 

## 2019-12-30 ENCOUNTER — Ambulatory Visit: Payer: Medicare Other | Admitting: Pulmonary Disease

## 2020-01-08 ENCOUNTER — Ambulatory Visit (INDEPENDENT_AMBULATORY_CARE_PROVIDER_SITE_OTHER): Payer: Medicare Other | Admitting: Internal Medicine

## 2020-01-08 ENCOUNTER — Encounter: Payer: Self-pay | Admitting: Internal Medicine

## 2020-01-08 ENCOUNTER — Other Ambulatory Visit: Payer: Self-pay

## 2020-01-08 VITALS — BP 130/76 | HR 65 | Temp 98.3°F | Ht 63.0 in | Wt 209.0 lb

## 2020-01-08 DIAGNOSIS — S51851A Open bite of right forearm, initial encounter: Secondary | ICD-10-CM

## 2020-01-08 DIAGNOSIS — S61451A Open bite of right hand, initial encounter: Secondary | ICD-10-CM | POA: Diagnosis not present

## 2020-01-08 DIAGNOSIS — Z23 Encounter for immunization: Secondary | ICD-10-CM

## 2020-01-08 DIAGNOSIS — Z1283 Encounter for screening for malignant neoplasm of skin: Secondary | ICD-10-CM | POA: Diagnosis not present

## 2020-01-08 DIAGNOSIS — D225 Melanocytic nevi of trunk: Secondary | ICD-10-CM

## 2020-01-08 MED ORDER — AMOXICILLIN-POT CLAVULANATE 875-125 MG PO TABS: 1.0000 | ORAL_TABLET | Freq: Two times a day (BID) | ORAL | 0 refills | Status: DC

## 2020-01-08 MED ORDER — MUPIROCIN 2 % EX OINT: 1.0000 "application " | TOPICAL_OINTMENT | Freq: Three times a day (TID) | CUTANEOUS | 0 refills | Status: DC

## 2020-01-08 NOTE — Patient Instructions (Addendum)
We referred you to [facility] to get your skin checked   [address]  [phone]   Take care  Tdap (Tetanus, Diphtheria, Pertussis) Vaccine: What You Need to Know 1. Why get vaccinated? Tdap vaccine can prevent tetanus, diphtheria, and pertussis. Diphtheria and pertussis spread from person to person. Tetanus enters the body through cuts or wounds.  TETANUS (T) causes painful stiffening of the muscles. Tetanus can lead to serious health problems, including being unable to open the mouth, having trouble swallowing and breathing, or death.  DIPHTHERIA (D) can lead to difficulty breathing, heart failure, paralysis, or death.  PERTUSSIS (aP), also known as "whooping cough," can cause uncontrollable, violent coughing which makes it hard to breathe, eat, or drink. Pertussis can be extremely serious in babies and young children, causing pneumonia, convulsions, brain damage, or death. In teens and adults, it can cause weight loss, loss of bladder control, passing out, and rib fractures from severe coughing. 2. Tdap vaccine Tdap is only for children 7 years and older, adolescents, and adults.  Adolescents should receive a single dose of Tdap, preferably at age 49 or 43 years. Pregnant women should get a dose of Tdap during every pregnancy, to protect the newborn from pertussis. Infants are most at risk for severe, life-threatening complications from pertussis. Adults who have never received Tdap should get a dose of Tdap. Also, adults should receive a booster dose every 10 years, or earlier in the case of a severe and dirty wound or burn. Booster doses can be either Tdap or Td (a different vaccine that protects against tetanus and diphtheria but not pertussis). Tdap may be given at the same time as other vaccines. 3. Talk with your health care provider Tell your vaccine provider if the person getting the vaccine:  Has had an allergic reaction after a previous dose of any vaccine that  protects against tetanus, diphtheria, or pertussis, or has any severe, life-threatening allergies.  Has had a coma, decreased level of consciousness, or prolonged seizures within 7 days after a previous dose of any pertussis vaccine (DTP, DTaP, or Tdap).  Has seizures or another nervous system problem.  Has ever had Guillain-Barr Syndrome (also called GBS).  Has had severe pain or swelling after a previous dose of any vaccine that protects against tetanus or diphtheria. In some cases, your health care provider may decide to postpone Tdap vaccination to a future visit.  People with minor illnesses, such as a cold, may be vaccinated. People who are moderately or severely ill should usually wait until they recover before getting Tdap vaccine.  Your health care provider can give you more information. 4. Risks of a vaccine reaction  Pain, redness, or swelling where the shot was given, mild fever, headache, feeling tired, and nausea, vomiting, diarrhea, or stomachache sometimes happen after Tdap vaccine. People sometimes faint after medical procedures, including vaccination. Tell your provider if you feel dizzy or have vision changes or ringing in the ears.  As with any medicine, there is a very remote chance of a vaccine causing a severe allergic reaction, other serious injury, or death. 5. What if there is a serious problem? An allergic reaction could occur after the vaccinated person leaves the clinic. If you see signs of a severe allergic reaction (hives, swelling of the face and throat, difficulty breathing, a fast heartbeat, dizziness, or weakness), call 9-1-1 and get the person to the nearest hospital. For other signs that concern you, call your health care provider.  Adverse reactions should be reported to the Vaccine Adverse Event Reporting System (VAERS). Your health care provider will usually file this report, or you can do it yourself. Visit the VAERS website at www.vaers.SamedayNews.es or call  347-553-7972. VAERS is only for reporting reactions, and VAERS staff do not give medical advice. 6. The National Vaccine Injury Compensation Program The [employer] Vaccine Injury Compensation Program (VICP) is a federal program that was created to compensate people who may have been injured by certain vaccines. Visit the VICP website at GoldCloset.com.ee or call 386-101-1485 to learn about the program and about filing a claim. There is a time limit to file a claim for compensation. 7. How can I learn more?  Ask your health care provider.  Call your local or state health department.  Contact the Centers for Disease Control and Prevention (CDC): ? Call 817-647-8755 (1-800-CDC-INFO) or ? Visit CDC's website at http://hunter.com/ Vaccine Information Statement Tdap (Tetanus, Diphtheria, Pertussis) Vaccine (09/10/2018) This information is not intended to replace advice given to you by your health care provider. Make sure you discuss any questions you have with your health care provider. Document Revised: 09/19/2018 Document Reviewed: 09/22/2018 Elsevier Patient Education  [place], Adult Animal bites range from mild to serious. An animal bite can result in any of these injuries:  A scratch.  A deep, open cut.  A puncture of the skin.  A crush injury.  Tearing away of the skin or a body part.  A bone injury. A small bite from a house pet is usually less serious than a bite from a stray or wild animal, such as a raccoon, fox, skunk, or bat. That is because stray and wild animals have a higher risk of carrying a serious infection called rabies, which can be passed to humans through a bite. What increases the risk? You are more likely to be bitten by an animal if:  You are around unfamiliar pets.  You disturb an animal when it is eating, sleeping, or caring for its babies.  You are outdoors in a place where small, wild animals roam  freely. What are the signs or symptoms? Common symptoms of an animal bite include:  Pain.  Bleeding.  Swelling.  Bruising. How is this diagnosed? This condition may be diagnosed based on a physical exam and medical history. Your health care provider will examine your wound and ask for details about the animal and how the bite happened. You may also have tests, such as:  Blood tests to check for infection.  X-rays to check for damage to bones or joints.  Taking a fluid sample from your wound and checking it for infection (culture test). How is this treated? Treatment varies depending on the type of animal, where the bite is on your body, and your medical history. Treatment may include:  Caring for the wound. This often includes cleaning the wound, rinsing out (flushing) the wound with saline solution, and applying a bandage (dressing). In some cases, the wound may be closed with stitches (sutures), staples, skin glue, or adhesive strips.  Antibiotic medicine to prevent or treat infection. This medicine may be prescribed in pill or ointment form. If the bite area becomes infected, the medicine may be given through an IV.  A tetanus shot to prevent tetanus infection.  Rabies treatment to prevent rabies infection. This will be done if the animal could have rabies.  Surgery. This may be done if a bite gets infected or if there is  damage that needs to be repaired. Follow these instructions at home: Wound care   Follow instructions from your health care provider about how to take care of your wound. Make sure you: ? Wash your hands with soap and water before you change your dressing. If soap and water are not available, use hand sanitizer. ? Change your dressing as told by your health care provider. ? Leave sutures, skin glue, or adhesive strips in place. These skin closures may need to stay in place for 2 weeks or longer. If adhesive strip edges start to loosen and curl up, you may  trim the loose edges. Do not remove adhesive strips completely unless your health care provider tells you to do that.  Check your wound every day for signs of infection. Check for: ? More redness, swelling, or pain. ? More fluid or blood. ? Warmth. ? Pus or a bad smell. Medicines  Take or apply over-the-counter and prescription medicines only as told by your health care provider.  If you were prescribed an antibiotic, take or apply it as told by your health care provider. Do not stop using the antibiotic even if your condition improves. General instructions   Keep the injured area raised (elevated) above the level of your heart while you are sitting or lying down, if this is possible.  If directed, put ice on the injured area. ? Put ice in a plastic bag. ? Place a towel between your skin and the bag. ? Leave the ice on for 20 minutes, 2-3 times per day.  Keep all follow-up visits as told by your health care provider. This is important. Contact a health care provider if:  You have more redness, swelling, or pain around your wound.  Your wound feels warm to the touch.  You have a fever or chills.  You have a general feeling of sickness (malaise).  You feel nauseous or you vomit.  You have pain that does not get better. Get help right away if:  You have a red streak that leads away from your wound.  You have non-clear fluid or more blood coming from your wound.  There is pus or a bad smell coming from your wound.  You have trouble moving your injured area.  You have numbness or tingling that extends beyond the wound. Summary  Animal bites can range from mild to serious. An animal bite can cause a scratch on the skin, a deep open cut, a puncture of the skin, a crush injury, tearing away of the skin or a body part, or a bone injury.  Your health care provider will examine your wound and ask for details about the animal and how the bite happened.  You may also have  tests such as a blood test, X-ray, or testing of a fluid sample from your wound (culture test).  Treatment may include wound care, antibiotic medicine, a tetanus shot, and rabies treatment if the animal could have rabies. This information is not intended to replace advice given to you by your health care provider. Make sure you discuss any questions you have with your health care provider. Document Revised: 05/23/2017 Document Reviewed: 12/06/2016 Elsevier Patient Education  [place].

## 2020-01-08 NOTE — Progress Notes (Addendum)
Chief Complaint  Patient presents with  . Animal Bite    right hand. Was bit 01/07/20.   Acute  1. Right hand back of hand bite by siberian husky 78 month old dog recently adopted but dog is aggressive and she was reaching to grab an object off the floor and dog bit her right hand and forearm. Tried hydrogen peroxide, otc antibiotic oint and covering with bandage but needs Tdap vaccine as not had     Review of Systems  Respiratory: Negative for shortness of breath.   Cardiovascular: Negative for chest pain.  Skin:       +animal bite right hand and forearm    Past Medical History:  Diagnosis Date  . Anxiety   . Asthma   . Atypical chest pain    a. 2003 Cath: reportedly nl per pt [name]);  b. 04/2015 [place] Admission, neg troponin.  . Colon cancer ([place]) 1999   a. 1999: Pt had partial colectomy. Did develop lung metastasis. Had right upper lobectomy in 01/2007 then had associated chemotherapy.  Marland Kitchen COPD (chronic obstructive pulmonary disease) ([place])   . DDD (degenerative disc disease), lumbosacral   . Depression   . Echocardiogram abnormal    a. 02/2010 Echo: EF 55-60%, mild LAE, no regional wall motion abnormalities  . Elevated transaminase level    a. ? NASH;  b. 06/2015 - nl LFTs.  . GERD (gastroesophageal reflux disease)   . Hematuria   . Holter monitor, abnormal    a. 12/2009 Holter monitoring: NSR, rare PACs and PVCs.  Marland Kitchen Hypercholesterolemia   . Hyperlipidemia   . Hypertension   . Leaky heart valve    a. Per pt report - no evidence of valvular abnormalities on prior echoes.  . Lesion of skin of Right breast 10/20/2017  . Lung metastases ([place]) 2008   a. S/P resection (lobectomy - right)  . Morbid obesity ([place])   . Nephrolithiasis   . Parkinson disease ([place]) 08/2016   [name]   Past Surgical History:  Procedure Laterality Date  . BREAST EXCISIONAL BIOPSY Right 10/07/2009   benign  . CARDIAC CATHETERIZATION  2003   negative as per pt report  . CHOLECYSTECTOMY  2000  .  COLON SURGERY     Colon cancer, removed part of large colon  . COLONOSCOPY N/A 04/25/2015   Procedure: COLONOSCOPY;  Surgeon: Manya Silvas, MD;  Location: [facility] ENDOSCOPY;  Service: Endoscopy;  Laterality: N/A;  . COLONOSCOPY WITH PROPOFOL N/A 06/14/2017   Procedure: COLONOSCOPY WITH PROPOFOL;  Surgeon: Manya Silvas, MD;  Location: [facility] ENDOSCOPY;  Service: Endoscopy;  Laterality: N/A;  . COLONOSCOPY WITH PROPOFOL N/A 11/17/2019   Procedure: COLONOSCOPY WITH PROPOFOL;  Surgeon: [name], MD;  Location: [facility] ENDOSCOPY;  Service: Gastroenterology;  Laterality: N/A;  . LOBECTOMY  01/2007   Right lung, upper lobe right side removed  . TOTAL ABDOMINAL HYSTERECTOMY     abnormal bleeding  . TUBAL LIGATION  1980   Family History  Problem Relation Age of Onset  . Coronary artery disease Mother        died @ 78 of MI.  . Mental illness Mother   . Diabetes Mother   . Heart attack Father 9       MI  . Cancer Father        lung - died in his mid-70's  . Hypertension Father   . Diabetes Father   . Sudden death Brother   . Mental illness Brother   .  Heart attack Brother        MI @ 49, died @ age 69.  . Breast cancer Neg Hx    Social History   Socioeconomic History  . Marital status: Widowed    Spouse name: Not on file  . Number of children: Not on file  . Years of education: Not on file  . Highest education level: Not on file  Occupational History  . Not on file  Tobacco Use  . Smoking status: Passive Smoke Exposure - Never Smoker  . Smokeless tobacco: Never Used  . Tobacco comment: husband and son smoked in her home.  Vaping Use  . Vaping Use: Never used  Substance and Sexual Activity  . Alcohol use: Not Currently    Alcohol/week: 0.0 standard drinks  . Drug use: No  . Sexual activity: Never  Other Topics Concern  . Not on file  Social History Narrative   Widowed.   Disabled (colon/lung CA), retired.   Lives in [place] with her dtr, grand-dtr, and son.    Active.   Gets regular exercise, walks.   Social Determinants of Health   Financial Resource Strain:   . Difficulty of Paying Living Expenses:   Food Insecurity:   . Worried About Charity fundraiser in the Last Year:   . Arboriculturist in the Last Year:   Transportation Needs:   . Film/video editor (Medical):   Marland Kitchen Lack of Transportation (Non-Medical):   Physical Activity:   . Days of Exercise per Week:   . Minutes of Exercise per Session:   Stress:   . Feeling of Stress :   Social Connections:   . Frequency of Communication with Friends and Family:   . Frequency of Social Gatherings with Friends and Family:   . Attends Religious Services:   . Active Member of Clubs or Organizations:   . Attends Archivist Meetings:   Marland Kitchen Marital Status:   Intimate Partner Violence:   . Fear of Current or Ex-Partner:   . Emotionally Abused:   Marland Kitchen Physically Abused:   . Sexually Abused:    Current Meds  Medication Sig  . ALPRAZolam (XANAX) 0.5 MG tablet TAKE 1/2 TABLET BY MOUTH DAILY AS NEEDED  . amLODipine (NORVASC) 2.5 MG tablet Take 2.5 mg by mouth daily.  Marland Kitchen aspirin EC 81 MG tablet Take 81 mg by mouth as needed.   . calcium-vitamin D (OSCAL WITH D) 500-200 MG-UNIT tablet Take 1 tablet by mouth.  . carbidopa-levodopa (SINEMET) 25-100 MG tablet Take 1.5 in am and one at 11:00 and one in afternoon and 1.5 at night.  . citalopram (CELEXA) 10 MG tablet Taking 1 and half tablet daily.  . ferrous sulfate 325 (65 FE) MG tablet Take 325 mg by mouth daily with breakfast.   . fluticasone-salmeterol (ADVAIR HFA) 115-21 MCG/ACT inhaler Inhale 2 puffs into the lungs 2 (two) times daily.  Marland Kitchen omeprazole (PRILOSEC) 20 MG capsule Take 1 capsule (20 mg total) by mouth 2 (two) times daily before a meal.  . PROAIR HFA 108 (90 Base) MCG/ACT inhaler INHALE 2 PUFFS BY MOUTH INTO THE LUNGS EVERY 6 HOURS AS NEEDED FOR WHEEZING  . rosuvastatin (CRESTOR) 5 MG tablet TAKE 1 TABLET BY MOUTH ON MONDAY,  WEDNESDAY AND FRIDAY  . traZODone (DESYREL) 50 MG tablet TAKE 2 TABLETS BY MOUTH AT BEDTIME  . vitamin B-12 (CYANOCOBALAMIN) 100 MCG tablet Take 100 mcg by mouth daily.   Allergies  Allergen Reactions  .  Macrobid [Nitrofurantoin Macrocrystal] Shortness Of Breath and Palpitations  . Antihistamines, Diphenhydramine-Type Other (See Comments)    She does not know which antihistamine she has an allergic reaction to.  . Aspirin Other (See Comments)    Makes her sick on the stomach if she takes too many and causes bleeding.  Of note, she can take ibuprofen. Other reaction(s): Other (See Comments) Makes her sick on the stomach.Of note, she can take ibuprofen. Makes her sick on the stomach. Of note, she can take ibuprofen.  . Ceftin [Cefuroxime Axetil] Other (See Comments)    Chest pains  . Celecoxib Other (See Comments)    Reaction: Blood Disorder GI bleeding.  Marland Kitchen Cymbalta [Duloxetine Hcl] Other (See Comments)    Reaction: unknown  . Diphenhydramine Other (See Comments)  . Escitalopram Other (See Comments)  . Lexapro [Escitalopram Oxalate] Other (See Comments)    Reaction: Difficulty with speech  . Metronidazole Other (See Comments)    Other reaction(s): Unknown  . Nitrofurantoin Other (See Comments)  . Zoloft [Sertraline Hcl]   . Ciprofloxacin Other (See Comments)    Reaction: unknown   Recent Results (from the past 2160 hour(s))  Basic metabolic panel     Status: Abnormal   Collection Time: 10/14/19  8:28 PM  Result Value Ref Range   Sodium 135 135 - 145 mmol/L   Potassium 3.4 (L) 3.5 - 5.1 mmol/L   Chloride 106 98 - 111 mmol/L   CO2 22 22 - 32 mmol/L   Glucose, Bld 111 (H) 70 - 99 mg/dL    Comment: Glucose reference range applies only to samples taken after fasting for at least 8 hours.   BUN 20 8 - 23 mg/dL   Creatinine, Ser 1.19 (H) 0.44 - 1.00 mg/dL   Calcium 8.6 (L) 8.9 - 10.3 mg/dL   GFR calc non Af Amer 47 (L) >60 mL/min   GFR calc Af Amer 54 (L) >60 mL/min   Anion  gap 7 5 - 15    Comment: Performed at [facility], [address] [place], [place] [address]  CBC     Status: Abnormal   Collection Time: 10/14/19  8:28 PM  Result Value Ref Range   WBC 10.4 4.0 - 10.5 K/uL   RBC 3.98 3.87 - 5.11 MIL/uL   Hemoglobin 11.5 (L) 12.0 - 15.0 g/dL   HCT 35.4 (L) 36 - 46 %   MCV 88.9 80.0 - 100.0 fL   MCH 28.9 26.0 - 34.0 pg   MCHC 32.5 30.0 - 36.0 g/dL   RDW 13.1 11.5 - 15.5 %   Platelets 239 150 - 400 K/uL   nRBC 0.0 0.0 - 0.2 %    Comment: Performed at [facility], [address] [place], [place] [address]  POCT urinalysis dipstick     Status: Abnormal   Collection Time: 10/28/19  4:20 PM  Result Value Ref Range   Color, UA yellow yellow   Clarity, UA clear clear   Glucose, UA negative negative mg/dL   Bilirubin, UA negative negative   Ketones, POC UA negative negative mg/dL   Spec Grav, UA 1.015 1.010 - 1.025   Blood, UA trace-intact (A) negative   pH, UA 6.0 5.0 - 8.0   Protein Ur, POC negative negative mg/dL   Urobilinogen, UA 0.2 0.2 or 1.0 E.U./dL   Nitrite, UA Negative Negative   Leukocytes, UA Small (1+) (A) Negative  Urine Culture     Status: None   Collection Time:  10/28/19  4:25 PM   Specimen: Urine, Clean Catch  Result Value Ref Range   Specimen Description URINE, CLEAN CATCH    Special Requests NONE    Culture      NO GROWTH Performed at [facility], [place] [address], [place], [place] [address]    Report Status 10/30/2019 FINAL   SARS CORONAVIRUS 2 (TAT 6-24 HRS) Nasopharyngeal Nasopharyngeal Swab     Status: None   Collection Time: 10/30/19 10:38 AM   Specimen: Nasopharyngeal Swab  Result Value Ref Range   SARS Coronavirus 2 NEGATIVE NEGATIVE    Comment: (NOTE) SARS-CoV-2 target nucleic acids are NOT DETECTED. The SARS-CoV-2 RNA is generally detectable in upper and lower respiratory specimens during the acute phase of infection. Negative results do not preclude SARS-CoV-2 infection, do not rule  out co-infections with other pathogens, and should not be used as the sole basis for treatment or other patient management decisions. Negative results must be combined with clinical observations, patient history, and epidemiological information. The expected result is Negative. Fact Sheet for Patients: SugarRoll.be Fact Sheet for Healthcare Providers: https://www.woods-mathews.com/ This test is not yet approved or cleared by the [place] FDA and  has been authorized for detection and/or diagnosis of SARS-CoV-2 by FDA under an Emergency Use Authorization (EUA). This EUA will remain  in effect (meaning this test can be used) for the duration of the COVID-19 declaration under Section 56 4(b)(1) of the Act, 21 U.S.C. section 360bbb-3(b)(1), unless the authorization is terminated or revoked sooner. Performed at [facility], [place] [address] [place], [place] [address]   Basic metabolic panel     Status: Abnormal   Collection Time: 11/01/19 11:28 PM  Result Value Ref Range   Sodium 139 135 - 145 mmol/L   Potassium 3.9 3.5 - 5.1 mmol/L   Chloride 103 98 - 111 mmol/L   CO2 27 22 - 32 mmol/L   Glucose, Bld 113 (H) 70 - 99 mg/dL    Comment: Glucose reference range applies only to samples taken after fasting for at least 8 hours.   BUN 23 8 - 23 mg/dL   Creatinine, Ser 1.36 (H) 0.44 - 1.00 mg/dL   Calcium 9.0 8.9 - 10.3 mg/dL   GFR calc non Af Amer 40 (L) >60 mL/min   GFR calc Af Amer 46 (L) >60 mL/min   Anion gap 9 5 - 15    Comment: Performed at [facility], [address] [place], [place] [address]  CBC     Status: Abnormal   Collection Time: 11/01/19 11:28 PM  Result Value Ref Range   WBC 10.4 4.0 - 10.5 K/uL   RBC 4.08 3.87 - 5.11 MIL/uL   Hemoglobin 12.0 12.0 - 15.0 g/dL   HCT 35.5 (L) 36 - 46 %   MCV 87.0 80.0 - 100.0 fL   MCH 29.4 26.0 - 34.0 pg   MCHC 33.8 30.0 - 36.0 g/dL   RDW 13.0 11.5 - 15.5 %   Platelets  232 150 - 400 K/uL   nRBC 0.0 0.0 - 0.2 %    Comment: Performed at [facility], [place], [place] [address]  Troponin I (High Sensitivity)     Status: None   Collection Time: 11/01/19 11:28 PM  Result Value Ref Range   Troponin I (High Sensitivity) 5 <18 ng/L    Comment: (NOTE) Elevated high sensitivity troponin I (hsTnI) values and significant  changes across serial measurements may  suggest ACS but many other  chronic and acute conditions are known to elevate hsTnI results.  Refer to the "Links" section for chest pain algorithms and additional  guidance. Performed at [facility], [address] [place], [place] [address]   Urinalysis, Complete w Microscopic     Status: Abnormal   Collection Time: 11/02/19  5:51 AM  Result Value Ref Range   Color, Urine STRAW (A) YELLOW   APPearance CLEAR (A) CLEAR   Specific Gravity, Urine 1.008 1.005 - 1.030   pH 6.0 5.0 - 8.0   Glucose, UA NEGATIVE NEGATIVE mg/dL   Hgb urine dipstick SMALL (A) NEGATIVE   Bilirubin Urine NEGATIVE NEGATIVE   Ketones, ur NEGATIVE NEGATIVE mg/dL   Protein, ur NEGATIVE NEGATIVE mg/dL   Nitrite NEGATIVE NEGATIVE   Leukocytes,Ua TRACE (A) NEGATIVE   RBC / HPF 0-5 0 - 5 RBC/hpf   WBC, UA 0-5 0 - 5 WBC/hpf   Bacteria, UA NONE SEEN NONE SEEN   Squamous Epithelial / LPF 0-5 0 - 5    Comment: Performed at [facility], [address] [place], [place] [address]  CBC with Differential/Platelet     Status: None   Collection Time: 11/11/19 12:22 PM  Result Value Ref Range   WBC 9.5 4.0 - 10.5 K/uL   RBC 4.09 3.87 - 5.11 Mil/uL   Hemoglobin 12.0 12.0 - 15.0 g/dL   HCT 36.0 36 - 46 %   MCV 87.9 78.0 - 100.0 fl   MCHC 33.2 30.0 - 36.0 g/dL   RDW 13.7 11.5 - 15.5 %   Platelets 247.0 150 - 400 K/uL   Neutrophils Relative % 67.0 43 - 77 %   Lymphocytes Relative 23.5 12 - 46 %   Monocytes Relative 7.0 3 - 12 %   Eosinophils Relative 1.6 0 - 5 %   Basophils Relative 0.9 0  - 3 %   Neutro Abs 6.4 1.4 - 7.7 K/uL   Lymphs Abs 2.2 0.7 - 4.0 K/uL   Monocytes Absolute 0.7 0 - 1 K/uL   Eosinophils Absolute 0.2 0 - 0 K/uL   Basophils Absolute 0.1 0 - 0 K/uL  TSH     Status: None   Collection Time: 11/11/19 12:22 PM  Result Value Ref Range   TSH 2.26 0.35 - 4.50 uIU/mL  Basic metabolic panel     Status: Abnormal   Collection Time: 11/11/19 12:22 PM  Result Value Ref Range   Sodium 139 135 - 145 mEq/L   Potassium 3.9 3.5 - 5.1 mEq/L   Chloride 106 96 - 112 mEq/L   CO2 28 19 - 32 mEq/L   Glucose, Bld 93 70 - 99 mg/dL   BUN 15 6 - 23 mg/dL   Creatinine, Ser 1.15 0.40 - 1.20 mg/dL   GFR 46.65 (L) >60.00 mL/min   Calcium 9.1 8.4 - 10.5 mg/dL  IBC + Ferritin     Status: None   Collection Time: 11/11/19 12:22 PM  Result Value Ref Range   Iron 62 42 - 145 ug/dL   Transferrin 220.0 212.0 - 360.0 mg/dL   Saturation Ratios 20.1 20.0 - 50.0 %   Ferritin 35.3 10.0 - 291.0 ng/mL  SARS CORONAVIRUS 2 (TAT 6-24 HRS) Nasopharyngeal Nasopharyngeal Swab     Status: None   Collection Time: 11/13/19 11:21 AM   Specimen: Nasopharyngeal Swab  Result Value Ref Range   SARS Coronavirus 2 NEGATIVE NEGATIVE    Comment: (NOTE) SARS-CoV-2 target nucleic acids  are NOT DETECTED. The SARS-CoV-2 RNA is generally detectable in upper and lower respiratory specimens during the acute phase of infection. Negative results do not preclude SARS-CoV-2 infection, do not rule out co-infections with other pathogens, and should not be used as the sole basis for treatment or other patient management decisions. Negative results must be combined with clinical observations, patient history, and epidemiological information. The expected result is Negative. Fact Sheet for Patients: SugarRoll.be Fact Sheet for Healthcare Providers: https://www.woods-mathews.com/ This test is not yet approved or cleared by the [place] FDA and  has been authorized for  detection and/or diagnosis of SARS-CoV-2 by FDA under an Emergency Use Authorization (EUA). This EUA will remain  in effect (meaning this test can be used) for the duration of the COVID-19 declaration under Section 56 4(b)(1) of the Act, 21 U.S.C. section 360bbb-3(b)(1), unless the authorization is terminated or revoked sooner. Performed at [facility], [place] [address] [place], [place] [address]   Surgical pathology     Status: None   Collection Time: 11/17/19  9:21 AM  Result Value Ref Range   SURGICAL PATHOLOGY      SURGICAL PATHOLOGY CASE: [id number] PATIENT: [name] Surgical Pathology Report     Specimen Submitted: A. Colon polyp, ascending; cold snare B. Colon polyp, sigmoid; cold snare  Clinical History: History of colon cancer.  Polyps      DIAGNOSIS: A.  COLON POLYP, ASCENDING; COLD SNARE: - TUBULAR ADENOMA. - NEGATIVE FOR HIGH-GRADE DYSPLASIA AND MALIGNANCY.  B.  COLON POLYP, SIGMOID; COLD SNARE: - TUBULAR ADENOMA. - NEGATIVE FOR HIGH-GRADE DYSPLASIA AND MALIGNANCY.  GROSS DESCRIPTION: A. Labeled: Cold snare ascending colon polyp Received: In formalin Tissue fragment(s): 2 Size: 1.1 x 0.8 x 0.2 cm Description: Aggregate of tan polypoid fragments Entirely submitted in 1 cassette.  B. Labeled: Cold snare sigmoid colon polyp Received: In formalin Tissue fragment(s): Multiple Size: 1 x 0.3 x 0.1 cm Description: Aggregate of tan tissue fragments admixed with fecal matter Entirely submitted in 1 cassette.   Final Diagnosis performed by Naida Sleight, MD.   Electronically signed 11/18/2019 11:38:52AM The electronic signature indicates that the named Attending Pathologist has evaluated the specimen Technical component performed at [facility], [address], [place], [place] [address] Lab: [phone] Dir: Rush Farmer, MD, MMM  Professional component performed at [facility], [facility], [place], [place], [place]  [address] Lab: [phone] Dir: Dellia Nims. Rubinas, MD   POCT Urinalysis Dipstick     Status: Abnormal   Collection Time: 11/24/19 11:47 AM  Result Value Ref Range   Color, UA yellow    Clarity, UA cloudy    Glucose, UA Negative Negative   Bilirubin, UA negative    Ketones, UA positive    Spec Grav, UA 1.015 1.010 - 1.025   Blood, UA positive    pH, UA 7.0 5.0 - 8.0   Protein, UA Positive (A) Negative   Urobilinogen, UA negative (A) 0.2 or 1.0 E.U./dL   Nitrite, UA positive    Leukocytes, UA Large (3+) (A) Negative   Appearance     Odor    Urine Culture     Status: Abnormal   Collection Time: 11/24/19 11:49 AM   Specimen: Urine  Result Value Ref Range   MICRO NUMBER: [id number]    SPECIMEN QUALITY: Adequate    Sample Source NOT GIVEN    STATUS: FINAL    ISOLATE 1: Enterobacter cloacae complex (A)     Comment: Greater than 100,000  CFU/mL of Enterobacter cloacae complex      Susceptibility   Enterobacter cloacae complex - URINE CULTURE, REFLEX    AMOX/CLAVULANIC >=32 Resistant     CEFAZOLIN* >=64 Resistant      * For uncomplicated UTI caused by E. coli,K. pneumoniae or P. mirabilis: Cefazolin issusceptible if MIC <32 mcg/mL and predictssusceptible to the oral agents cefaclor, cefdinir,cefpodoxime, cefprozil, cefuroxime, cephalexinand loracarbef.    CEFEPIME <=1 Sensitive     CEFTRIAXONE <=1 Sensitive     CIPROFLOXACIN <=0.25 Sensitive     LEVOFLOXACIN <=0.12 Sensitive     ERTAPENEM <=0.5 Sensitive     GENTAMICIN <=1 Sensitive     IMIPENEM 0.5 Sensitive     NITROFURANTOIN 32 Sensitive     PIP/TAZO <=4 Sensitive     TOBRAMYCIN <=1 Sensitive     TRIMETH/SULFA* <=20 Sensitive      * For uncomplicated UTI caused by E. coli,K. pneumoniae or P. mirabilis: Cefazolin issusceptible if MIC <32 mcg/mL and predictssusceptible to the oral agents cefaclor, cefdinir,cefpodoxime, cefprozil, cefuroxime, cephalexinand loracarbef.Legend:S = Susceptible  I = IntermediateR = Resistant  NS = Not  susceptible* = Not tested  NR = Not reported**NN = See antimicrobic comments  Urine Microscopic Only     Status: Abnormal   Collection Time: 11/24/19 11:49 AM  Result Value Ref Range   WBC, UA TNTC(>50/hpf) (A) 0-2/hpf   RBC / HPF 3-6/hpf (A) 0-2/hpf   Squamous Epithelial / LPF Few(5-10/hpf) (A) Rare(0-4/hpf)   Bacteria, UA Many(>50/hpf) (A) None  CBC with Differential/Platelet     Status: Abnormal   Collection Time: 12/17/19 11:55 AM  Result Value Ref Range   WBC 9.2 4.0 - 10.5 K/uL   RBC 4.00 3.87 - 5.11 Mil/uL   Hemoglobin 11.7 (L) 12.0 - 15.0 g/dL   HCT 35.1 (L) 36 - 46 %   MCV 87.7 78.0 - 100.0 fl   MCHC 33.2 30.0 - 36.0 g/dL   RDW 13.9 11.5 - 15.5 %   Platelets 225.0 150 - 400 K/uL   Neutrophils Relative % 72.1 43 - 77 %   Lymphocytes Relative 20.0 12 - 46 %   Monocytes Relative 5.7 3 - 12 %   Eosinophils Relative 1.7 0 - 5 %   Basophils Relative 0.5 0 - 3 %   Neutro Abs 6.6 1.4 - 7.7 K/uL   Lymphs Abs 1.8 0.7 - 4.0 K/uL   Monocytes Absolute 0.5 0 - 1 K/uL   Eosinophils Absolute 0.2 0 - 0 K/uL   Basophils Absolute 0.0 0 - 0 K/uL  Hepatic function panel     Status: None   Collection Time: 12/17/19 11:55 AM  Result Value Ref Range   Total Bilirubin 0.3 0.2 - 1.2 mg/dL   Bilirubin, Direct 0.1 0.0 - 0.3 mg/dL   Alkaline Phosphatase 82 39 - 117 U/L   AST 12 0 - 37 U/L   ALT 3 0 - 35 U/L   Total Protein 6.3 6.0 - 8.3 g/dL   Albumin 3.9 3.5 - 5.2 g/dL  TSH     Status: None   Collection Time: 12/17/19 11:55 AM  Result Value Ref Range   TSH 1.86 0.35 - 4.50 uIU/mL  Vitamin B12     Status: Abnormal   Collection Time: 12/17/19 11:55 AM  Result Value Ref Range   Vitamin B-12 1,109 (H) 211 - 911 pg/mL  Basic metabolic panel     Status: Abnormal   Collection Time: 12/17/19 11:55 AM  Result Value Ref Range  Sodium 141 135 - 145 mEq/L   Potassium 4.1 3.5 - 5.1 mEq/L   Chloride 107 96 - 112 mEq/L   CO2 27 19 - 32 mEq/L   Glucose, Bld 104 (H) 70 - 99 mg/dL   BUN 16 6 - 23  mg/dL   Creatinine, Ser 1.12 0.40 - 1.20 mg/dL   GFR 48.08 (L) >60.00 mL/min   Calcium 9.1 8.4 - 10.5 mg/dL  Urinalysis, Routine w reflex microscopic     Status: Abnormal   Collection Time: 12/17/19 11:55 AM  Result Value Ref Range   Color, Urine YELLOW Yellow;Lt. Yellow;Straw;Dark Yellow;Amber;Green;Red;Brown   APPearance Sl Cloudy (A) Clear;Turbid;Slightly Cloudy;Cloudy   Specific Gravity, Urine 1.015 1.000 - 1.030   pH 6.0 5.0 - 8.0   Total Protein, Urine NEGATIVE Negative   Urine Glucose NEGATIVE Negative   Ketones, ur NEGATIVE Negative   Bilirubin Urine NEGATIVE Negative   Hgb urine dipstick TRACE-LYSED (A) Negative   Urobilinogen, UA 0.2 0.0 - 1.0   Leukocytes,Ua SMALL (A) Negative   Nitrite NEGATIVE Negative   WBC, UA 3-6/hpf (A) 0-2/hpf   RBC / HPF 0-2/hpf 0-2/hpf   Mucus, UA Presence of (A) None   Squamous Epithelial / LPF Few(5-10/hpf) (A) Rare(0-4/hpf)   Bacteria, UA Rare(<10/hpf) (A) None  Urine Culture     Status: None   Collection Time: 12/17/19 11:55 AM   Specimen: Urine  Result Value Ref Range   MICRO NUMBER: [id number]    SPECIMEN QUALITY: Adequate    Sample Source NOT GIVEN    STATUS: FINAL    Result: No Growth   IBC + Ferritin     Status: Abnormal   Collection Time: 12/17/19 12:07 PM  Result Value Ref Range   Iron 54 42 - 145 ug/dL   Transferrin 228.0 212.0 - 360.0 mg/dL   Saturation Ratios 16.9 (L) 20.0 - 50.0 %   Ferritin 35.6 10.0 - 291.0 ng/mL   Objective  Body mass index is 37.02 kg/m. Wt Readings from Last 3 Encounters:  01/08/20 (!) 209 lb (94.8 kg)  12/17/19 210 lb (95.3 kg)  11/24/19 207 lb 6.4 oz (94.1 kg)   Temp Readings from Last 3 Encounters:  01/08/20 98.3 F (36.8 C) (Oral)  12/17/19 97.7 F (36.5 C)  11/24/19 (!) 97.2 F (36.2 C) (Temporal)   BP Readings from Last 3 Encounters:  01/08/20 (!) 130/76  12/17/19 118/78  11/24/19 124/78   Pulse Readings from Last 3 Encounters:  01/08/20 65  12/17/19 78  11/24/19 74     Physical Exam Vitals and nursing note reviewed.  Constitutional:      Appearance: Normal appearance. She is well-developed and well-groomed.  HENT:     Head: Normocephalic and atraumatic.  Eyes:     Conjunctiva/sclera: Conjunctivae normal.     Pupils: Pupils are equal, round, and reactive to light.  Cardiovascular:     Rate and Rhythm: Normal rate and regular rhythm.     Heart sounds: Normal heart sounds. No murmur heard.   Pulmonary:     Effort: Pulmonary effort is normal.     Breath sounds: Normal breath sounds.  Skin:    General: Skin is warm and dry.       Neurological:     General: No focal deficit present.     Mental Status: She is alert and oriented to person, place, and time. Mental status is at baseline.     Gait: Gait normal.  Psychiatric:  Attention and Perception: Attention and perception normal.        Mood and Affect: Mood and affect normal.        Speech: Speech normal.        Behavior: Behavior is cooperative.        Thought Content: Thought content normal.        Cognition and Memory: Cognition and memory normal.        Judgment: Judgment normal.     Assessment  Plan  Animal bite of right hand, initial encounter - Plan: Tdap vaccine greater than or equal to 7yo IM, mupirocin ointment (BACTROBAN) 2 %, amoxicillin-clavulanate (AUGMENTIN) 875-125 MG tablet bid x 7-10 days   Animal bite of right forearm, initial encounter - Plan: Tdap vaccine greater than or equal to 7yo IM, mupirocin ointment (BACTROBAN) 2 %, amoxicillin-clavulanate (AUGMENTIN) 875-125 MG tablet  Nevus of back  -referred [facility]  Provider: Dr. Olivia Mackie McLean-Scocuzza-Internal Medicine

## 2020-01-13 DIAGNOSIS — J449 Chronic obstructive pulmonary disease, unspecified: Secondary | ICD-10-CM | POA: Diagnosis not present

## 2020-01-13 DIAGNOSIS — R0602 Shortness of breath: Secondary | ICD-10-CM | POA: Diagnosis not present

## 2020-01-13 DIAGNOSIS — J90 Pleural effusion, not elsewhere classified: Secondary | ICD-10-CM | POA: Diagnosis not present

## 2020-01-13 DIAGNOSIS — J4520 Mild intermittent asthma, uncomplicated: Secondary | ICD-10-CM | POA: Diagnosis not present

## 2020-01-13 DIAGNOSIS — J432 Centrilobular emphysema: Secondary | ICD-10-CM | POA: Diagnosis not present

## 2020-01-13 DIAGNOSIS — I517 Cardiomegaly: Secondary | ICD-10-CM | POA: Diagnosis not present

## 2020-01-16 ENCOUNTER — Other Ambulatory Visit: Payer: Self-pay | Admitting: Internal Medicine

## 2020-01-18 ENCOUNTER — Other Ambulatory Visit: Payer: Self-pay | Admitting: Internal Medicine

## 2020-02-15 IMAGING — CT CT HEAD W/O CM
3 series · 16 of 46 positions shown, 19 images · non-contrast
Comparison: 04/04/2018

CLINICAL DATA: Headache

EXAM:
CT HEAD WITHOUT CONTRAST
TECHNIQUE: Contiguous axial images were obtained from the base of the skull
through the vertex without intravenous contrast.

[Series 2: head wo · axial · 0.39mm/px · z∈[-129,-9]mm · 10 of 29 slices shown, 13 images]
[im 3/29  brain]
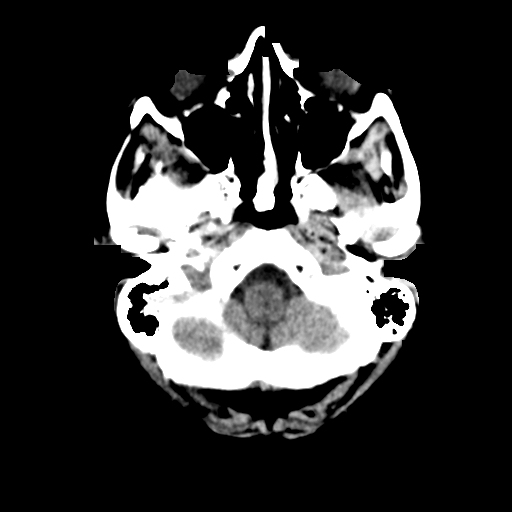
[im 3/29  bone]
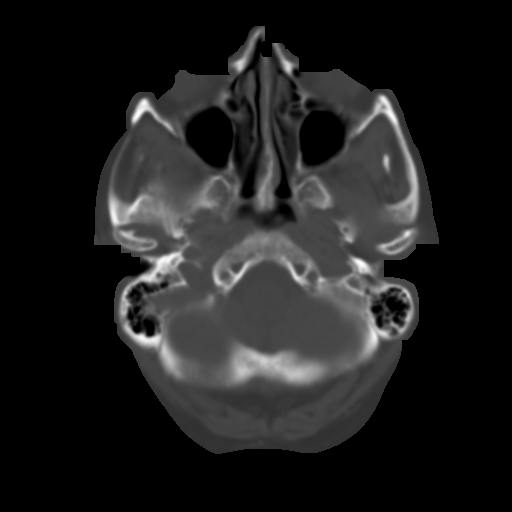
[im 6/29  brain]
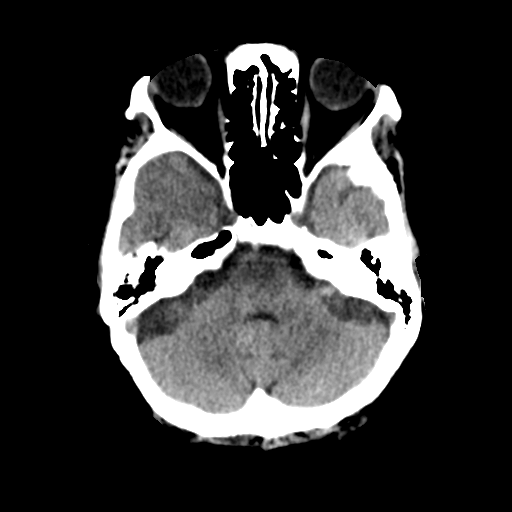
[im 8/29  brain]
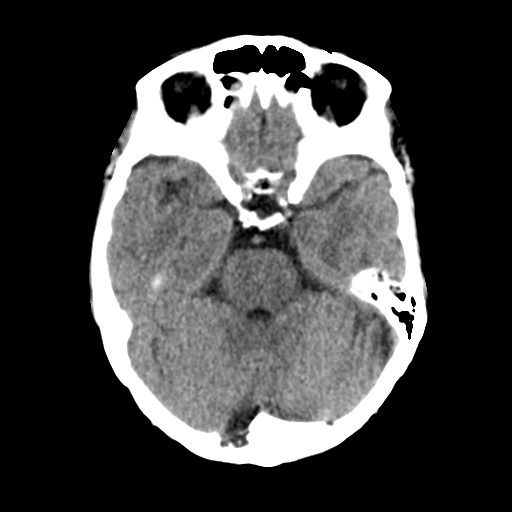
[im 11/29  brain]
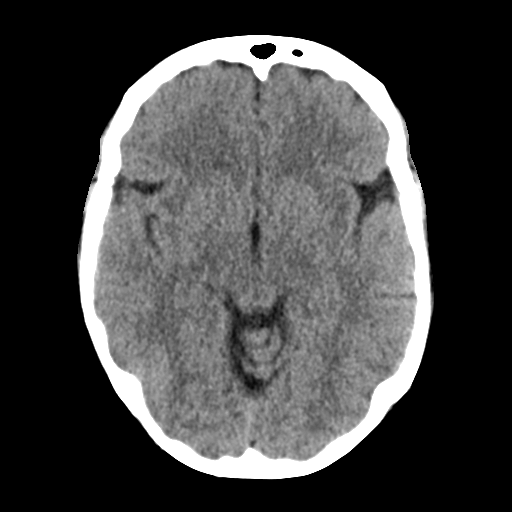
[im 14/29  brain]
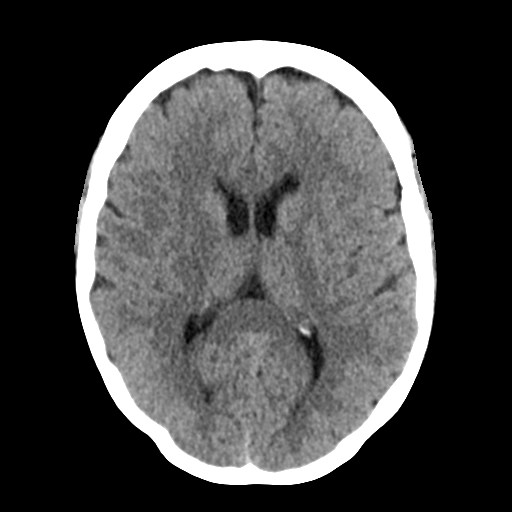
[im 14/29  bone]
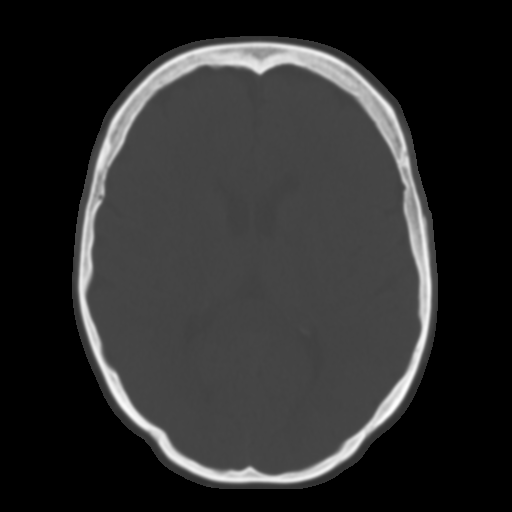
[im 16/29  brain]
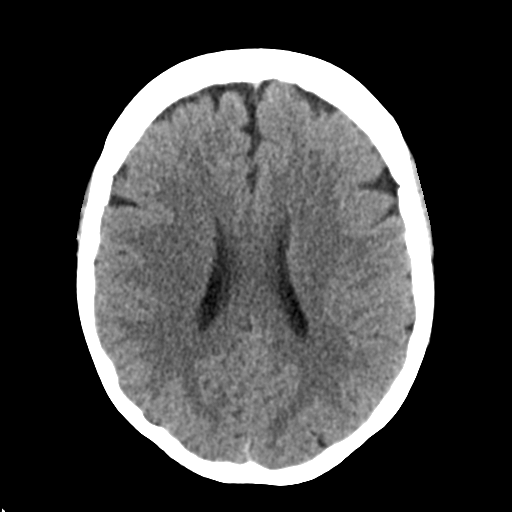
[im 19/29  brain]
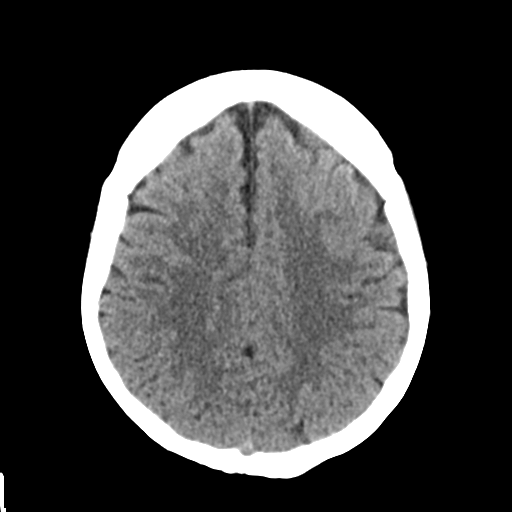
[im 22/29  brain]
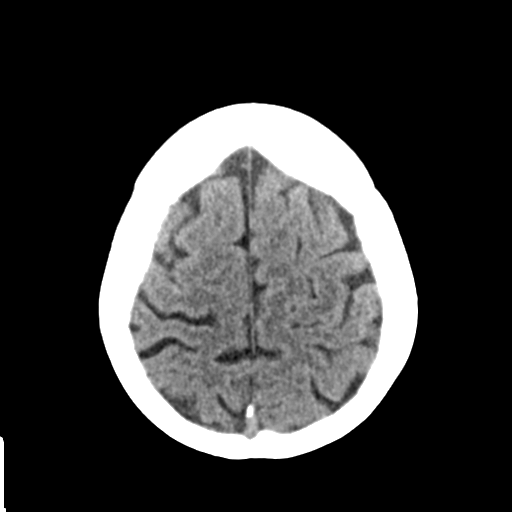
[im 24/29  brain]
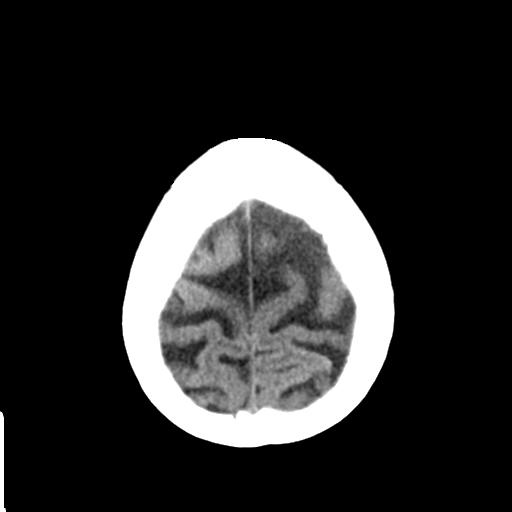
[im 24/29  bone]
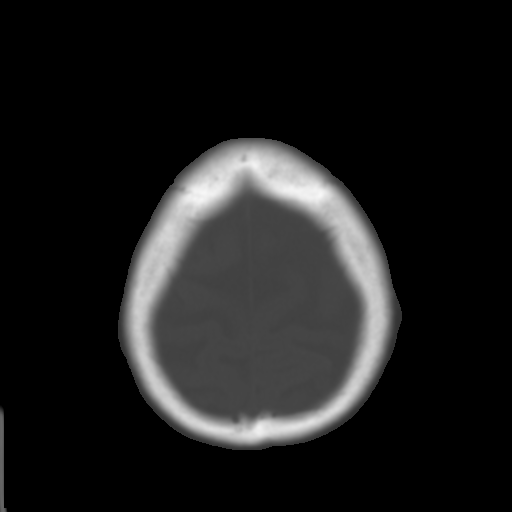
[im 27/29  brain]
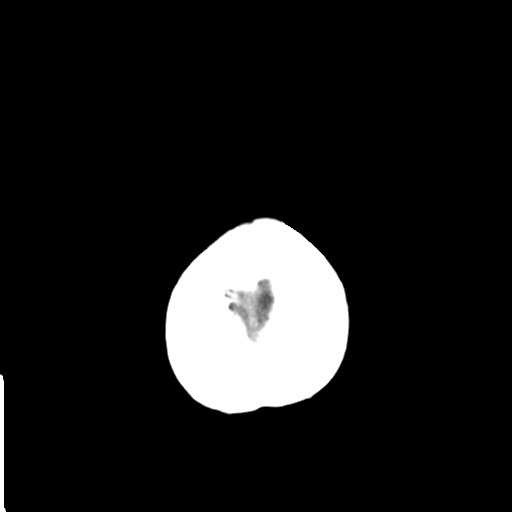

[Series 4: coronal soft tissue · coronal · 0.27mm/px · 3 of 59 slices shown]
[im 20/59  brain]
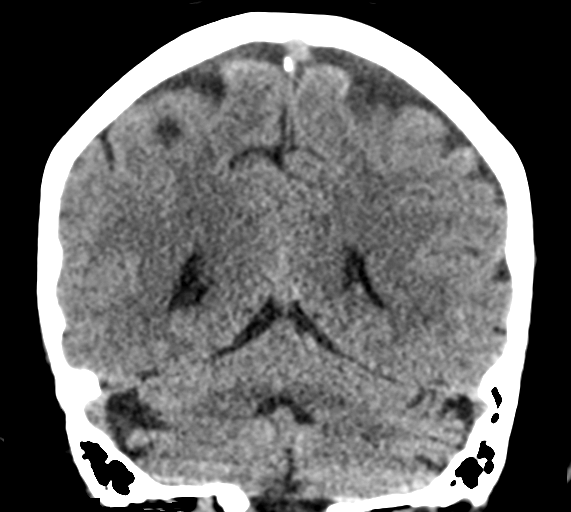
[im 26/59  brain]
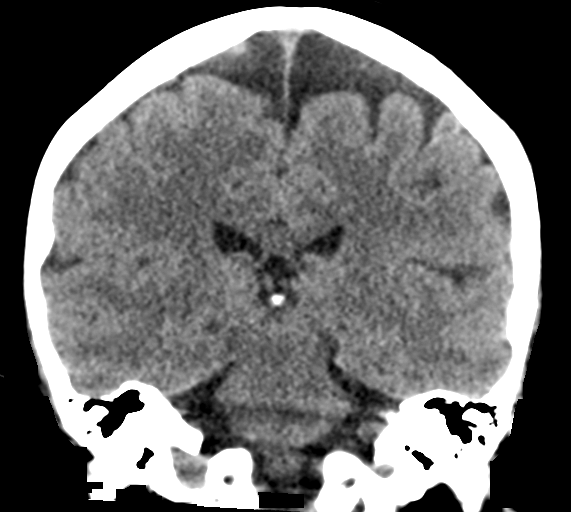
[im 33/59  brain]
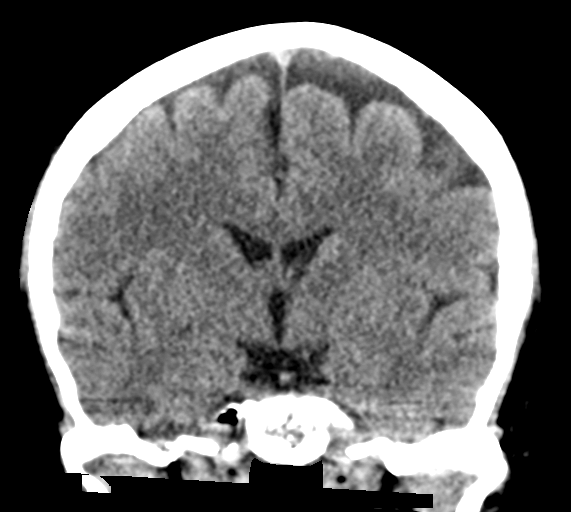

[Series 5: sagittal soft tissue · sagittal · 0.27mm/px · 3 of 53 slices shown]
[im 18/53  brain]
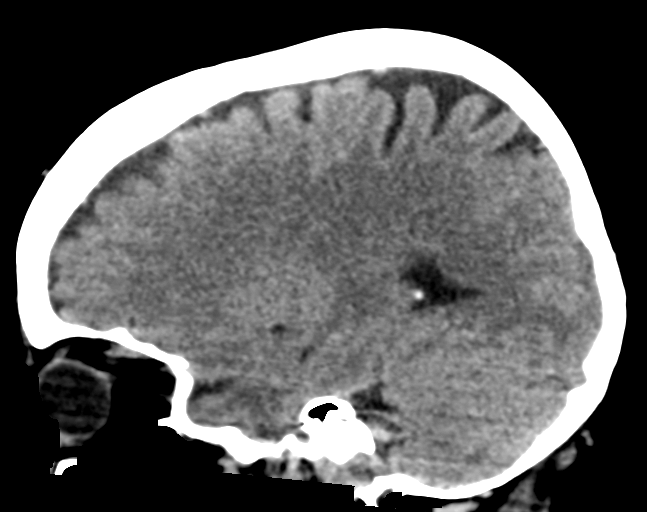
[im 27/53  brain]
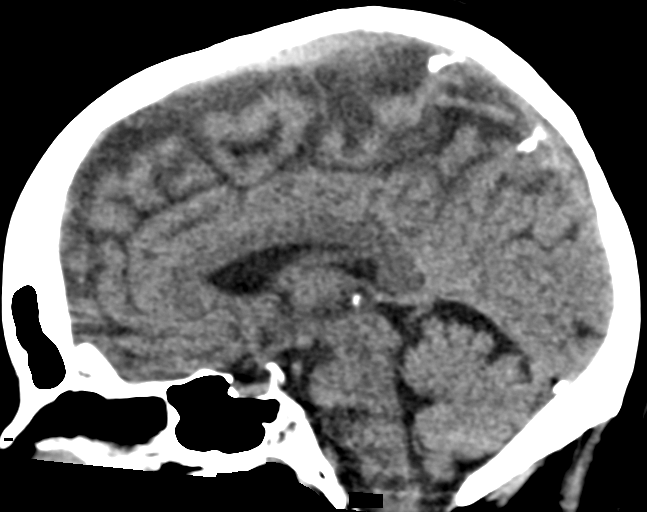
[im 35/53  brain]
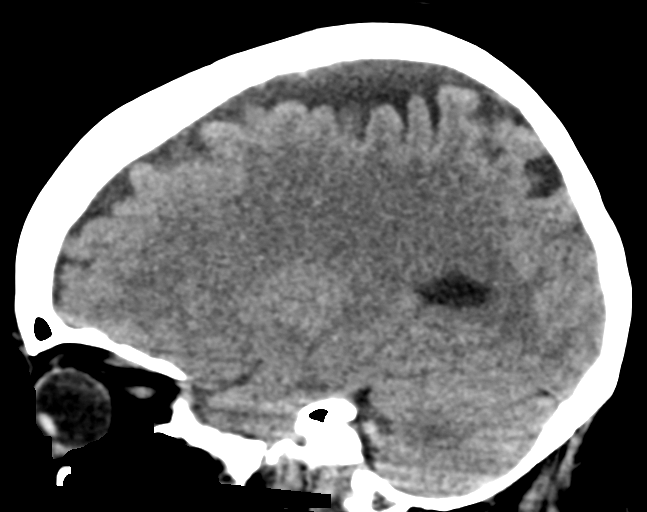

[16 of 46 positions shown; findings below may reference images not displayed]

FINDINGS: Brain: No evidence of acute infarction, hemorrhage, hydrocephalus,
extra-axial collection or mass lesion/mass effect.

Vascular: No hyperdense vessels.  Carotid vascular calcification.

Skull: No fracture. Prominent arachnoid granulation in the posterior
fossa.

Sinuses/Orbits: No acute finding.

Other: None
IMPRESSION: Negative non contrasted CT appearance of the brain.

## 2020-02-19 DIAGNOSIS — I499 Cardiac arrhythmia, unspecified: Secondary | ICD-10-CM | POA: Diagnosis not present

## 2020-02-19 DIAGNOSIS — G20 Parkinson's disease: Secondary | ICD-10-CM | POA: Diagnosis not present

## 2020-02-19 DIAGNOSIS — J449 Chronic obstructive pulmonary disease, unspecified: Secondary | ICD-10-CM | POA: Diagnosis not present

## 2020-02-19 DIAGNOSIS — R110 Nausea: Secondary | ICD-10-CM | POA: Diagnosis not present

## 2020-02-19 DIAGNOSIS — N183 Chronic kidney disease, stage 3 unspecified: Secondary | ICD-10-CM | POA: Diagnosis not present

## 2020-02-19 DIAGNOSIS — R262 Difficulty in walking, not elsewhere classified: Secondary | ICD-10-CM | POA: Diagnosis not present

## 2020-02-19 DIAGNOSIS — Z85038 Personal history of other malignant neoplasm of large intestine: Secondary | ICD-10-CM | POA: Diagnosis not present

## 2020-02-19 DIAGNOSIS — R350 Frequency of micturition: Secondary | ICD-10-CM | POA: Diagnosis not present

## 2020-02-19 DIAGNOSIS — R0602 Shortness of breath: Secondary | ICD-10-CM | POA: Diagnosis not present

## 2020-02-19 DIAGNOSIS — R42 Dizziness and giddiness: Secondary | ICD-10-CM | POA: Diagnosis not present

## 2020-02-19 DIAGNOSIS — I129 Hypertensive chronic kidney disease with stage 1 through stage 4 chronic kidney disease, or unspecified chronic kidney disease: Secondary | ICD-10-CM | POA: Diagnosis not present

## 2020-02-19 DIAGNOSIS — Z79899 Other long term (current) drug therapy: Secondary | ICD-10-CM | POA: Diagnosis not present

## 2020-02-19 DIAGNOSIS — E785 Hyperlipidemia, unspecified: Secondary | ICD-10-CM | POA: Diagnosis not present

## 2020-02-19 DIAGNOSIS — K219 Gastro-esophageal reflux disease without esophagitis: Secondary | ICD-10-CM | POA: Diagnosis not present

## 2020-02-24 ENCOUNTER — Ambulatory Visit (INDEPENDENT_AMBULATORY_CARE_PROVIDER_SITE_OTHER): Payer: Medicare Other | Admitting: Internal Medicine

## 2020-02-24 ENCOUNTER — Other Ambulatory Visit: Payer: Self-pay

## 2020-02-24 ENCOUNTER — Encounter: Payer: Self-pay | Admitting: Internal Medicine

## 2020-02-24 VITALS — BP 110/60 | HR 74 | Temp 97.5°F | Ht 62.99 in | Wt 205.4 lb

## 2020-02-24 DIAGNOSIS — R2681 Unsteadiness on feet: Secondary | ICD-10-CM

## 2020-02-24 DIAGNOSIS — I10 Essential (primary) hypertension: Secondary | ICD-10-CM

## 2020-02-24 DIAGNOSIS — M7989 Other specified soft tissue disorders: Secondary | ICD-10-CM

## 2020-02-24 DIAGNOSIS — J449 Chronic obstructive pulmonary disease, unspecified: Secondary | ICD-10-CM

## 2020-02-24 DIAGNOSIS — Z1231 Encounter for screening mammogram for malignant neoplasm of breast: Secondary | ICD-10-CM | POA: Diagnosis not present

## 2020-02-24 DIAGNOSIS — D649 Anemia, unspecified: Secondary | ICD-10-CM

## 2020-02-24 DIAGNOSIS — E7800 Pure hypercholesterolemia, unspecified: Secondary | ICD-10-CM | POA: Diagnosis not present

## 2020-02-24 DIAGNOSIS — R739 Hyperglycemia, unspecified: Secondary | ICD-10-CM

## 2020-02-24 DIAGNOSIS — G20 Parkinson's disease: Secondary | ICD-10-CM

## 2020-02-24 DIAGNOSIS — G20A1 Parkinson's disease without dyskinesia, without mention of fluctuations: Secondary | ICD-10-CM

## 2020-02-24 DIAGNOSIS — Z23 Encounter for immunization: Secondary | ICD-10-CM

## 2020-02-24 DIAGNOSIS — I773 Arterial fibromuscular dysplasia: Secondary | ICD-10-CM

## 2020-02-24 DIAGNOSIS — F339 Major depressive disorder, recurrent, unspecified: Secondary | ICD-10-CM

## 2020-02-24 DIAGNOSIS — N183 Chronic kidney disease, stage 3 unspecified: Secondary | ICD-10-CM

## 2020-02-24 DIAGNOSIS — E782 Mixed hyperlipidemia: Secondary | ICD-10-CM

## 2020-02-24 NOTE — Progress Notes (Signed)
Patient ID: Almee J Weyrauch, female   DOB: 12/19/1950, 69 y.o.   MRN: [id number]   Subjective:    Patient ID: Farris J Bickert, female    DOB: 09/19/1950, 69 y.o.   MRN: [id number]  HPI This visit occurred during the SARS-CoV-2 public health emergency.  Safety protocols were in place, including screening questions prior to the visit, additional usage of staff PPE, and extensive cleaning of exam room while observing appropriate contact time as indicated for disinfecting solutions.  Patient here for a scheduled follow up.  She was seen in ER 02/19/20 for dizziness. Note reviewed.  Labs and w/up - unrevealing.  Question of peripheral vertigo.  On questioning pt, states the morning of 02/19/20 - noticed unsteady.  Denied room spinning.  States was "stumbling around" - bumping into things.  Some nasal congestion.  No headache.  No fever.  Went to ER as outlined.  Discharged with meclizine and zofran.  Has not taken zofran.  Took one meclizine.  Symptoms have resolved.  No chest pain or sob.  No acid reflux or abdominal pain reported.  Bowels stable.   Past Medical History:  Diagnosis Date  . Anxiety   . Asthma   . Atypical chest pain    a. 2003 Cath: reportedly nl per pt ([place]);  b. 04/2015 [facility] Admission, neg troponin.  . Colon cancer (HCC) 1999   a. 1999: Pt had partial colectomy. Did develop lung metastasis. Had right upper lobectomy in 01/2007 then had associated chemotherapy.  . COPD (chronic obstructive pulmonary disease) (HCC)   . DDD (degenerative disc disease), lumbosacral   . Depression   . Echocardiogram abnormal    a. 02/2010 Echo: EF 55-60%, mild LAE, no regional wall motion abnormalities  . Elevated transaminase level    a. ? NASH;  b. 06/2015 - nl LFTs.  . GERD (gastroesophageal reflux disease)   . Hematuria   . Holter monitor, abnormal    a. 12/2009 Holter monitoring: NSR, rare PACs and PVCs.  . Hypercholesterolemia   . Hyperlipidemia   . Hypertension   . Leaky heart valve     a. Per pt report - no evidence of valvular abnormalities on prior echoes.  . Lesion of skin of Right breast 10/20/2017  . Lung metastases (HCC) 2008   a. S/P resection (lobectomy - right)  . Morbid obesity (HCC)   . Nephrolithiasis   . Parkinson disease (HCC) 08/2016   [name]   Past Surgical History:  Procedure Laterality Date  . BREAST EXCISIONAL BIOPSY Right 10/07/2009   benign  . CARDIAC CATHETERIZATION  2003   negative as per pt report  . CHOLECYSTECTOMY  2000  . COLON SURGERY     Colon cancer, removed part of large colon  . COLONOSCOPY N/A 04/25/2015   Procedure: COLONOSCOPY;  Surgeon: Robert T Elliott, MD;  Location: [facility] ENDOSCOPY;  Service: Endoscopy;  Laterality: N/A;  . COLONOSCOPY WITH PROPOFOL N/A 06/14/2017   Procedure: COLONOSCOPY WITH PROPOFOL;  Surgeon: Elliott, Robert T, MD;  Location: [facility] ENDOSCOPY;  Service: Endoscopy;  Laterality: N/A;  . COLONOSCOPY WITH PROPOFOL N/A 11/17/2019   Procedure: COLONOSCOPY WITH PROPOFOL;  Surgeon: Anna, Kiran, MD;  Location: [facility] ENDOSCOPY;  Service: Gastroenterology;  Laterality: N/A;  . LOBECTOMY  01/2007   Right lung, upper lobe right side removed  . TOTAL ABDOMINAL HYSTERECTOMY     abnormal bleeding  . TUBAL LIGATION  1980   Family History  Problem Relation Age of Onset  . Coronary artery disease Mother          died @ 65 of MI.  . Mental illness Mother   . Diabetes Mother   . Heart attack Father 65       MI  . Cancer Father        lung - died in his mid-70's  . Hypertension Father   . Diabetes Father   . Sudden death Brother   . Mental illness Brother   . Heart attack Brother        MI @ 38, died @ age 40.  . Breast cancer Neg Hx    Social History   Socioeconomic History  . Marital status: Widowed    Spouse name: Not on file  . Number of children: Not on file  . Years of education: Not on file  . Highest education level: Not on file  Occupational History  . Not on file  Tobacco Use  . Smoking status: Passive  Smoke Exposure - Never Smoker  . Smokeless tobacco: Never Used  . Tobacco comment: husband and son smoked in her home.  Vaping Use  . Vaping Use: Never used  Substance and Sexual Activity  . Alcohol use: Not Currently    Alcohol/week: 0.0 standard drinks  . Drug use: No  . Sexual activity: Never  Other Topics Concern  . Not on file  Social History Narrative   Widowed.   Disabled (colon/lung CA), retired.   Lives in [place] with her dtr, grand-dtr, and son.   Active.   Gets regular exercise, walks.   Social Determinants of Health   Financial Resource Strain:   . Difficulty of Paying Living Expenses: Not on file  Food Insecurity:   . Worried About Running Out of Food in the Last Year: Not on file  . Ran Out of Food in the Last Year: Not on file  Transportation Needs:   . Lack of Transportation (Medical): Not on file  . Lack of Transportation (Non-Medical): Not on file  Physical Activity:   . Days of Exercise per Week: Not on file  . Minutes of Exercise per Session: Not on file  Stress:   . Feeling of Stress : Not on file  Social Connections:   . Frequency of Communication with Friends and Family: Not on file  . Frequency of Social Gatherings with Friends and Family: Not on file  . Attends Religious Services: Not on file  . Active Member of Clubs or Organizations: Not on file  . Attends Club or Organization Meetings: Not on file  . Marital Status: Not on file    Outpatient Encounter Medications as of 02/24/2020  Medication Sig  . ALPRAZolam (XANAX) 0.5 MG tablet TAKE 1/2 TABLET BY MOUTH DAILY AS NEEDED  . amLODipine (NORVASC) 2.5 MG tablet Take 2.5 mg by mouth daily.  . amoxicillin-clavulanate (AUGMENTIN) 875-125 MG tablet Take 1 tablet by mouth 2 (two) times daily. X 7-10 days  . aspirin EC 81 MG tablet Take 81 mg by mouth as needed.   . calcium-vitamin D (OSCAL WITH D) 500-200 MG-UNIT tablet Take 1 tablet by mouth.  . carbidopa-levodopa (SINEMET) 25-100 MG  tablet Take 1.5 in am and one at 11:00 and one in afternoon and 1.5 at night.  . citalopram (CELEXA) 10 MG tablet Taking 1 and half tablet daily.  . ferrous sulfate 325 (65 FE) MG tablet Take 325 mg by mouth daily with breakfast.   . fluticasone-salmeterol (ADVAIR HFA) 115-21 MCG/ACT inhaler Inhale 2 puffs into the lungs 2 (two) times daily.  . montelukast (  SINGULAIR) 10 MG tablet Take by mouth.  . mupirocin ointment (BACTROBAN) 2 % Apply 1 application topically 3 (three) times daily.  . omeprazole (PRILOSEC) 20 MG capsule Take 1 capsule (20 mg total) by mouth 2 (two) times daily before a meal.  . PROAIR HFA 108 (90 Base) MCG/ACT inhaler INHALE 2 PUFFS BY MOUTH INTO THE LUNGS EVERY 6 HOURS AS NEEDED FOR WHEEZING  . rosuvastatin (CRESTOR) 5 MG tablet TAKE 1 TABLET BY MOUTH ON MONDAY, WEDNESDAY AND FRIDAY  . Spacer/Aero-Holding Chambers (EASIVENT) inhaler See admin instructions.  . traZODone (DESYREL) 50 MG tablet TAKE 2 TABLETS BY MOUTH AT BEDTIME  . vitamin B-12 (CYANOCOBALAMIN) 100 MCG tablet Take 100 mcg by mouth daily.   No facility-administered encounter medications on file as of 02/24/2020.    Review of Systems  Constitutional: Negative for appetite change and unexpected weight change.  HENT: Negative for sinus pressure.        Minimal nasal congestion - noted previously.   Respiratory: Negative for cough, chest tightness and shortness of breath.   Cardiovascular: Negative for chest pain, palpitations and leg swelling.  Gastrointestinal: Negative for abdominal pain, diarrhea, nausea and vomiting.  Genitourinary: Negative for difficulty urinating and dysuria.  Musculoskeletal: Negative for myalgias and neck pain.  Skin: Negative for color change and rash.  Neurological:       Initially reported dizziness. On questioning, previous unsteadiness.    Psychiatric/Behavioral: Negative for agitation and dysphoric mood.       Objective:    Physical Exam Vitals reviewed.    Constitutional:      General: She is not in acute distress.    Appearance: Normal appearance.  HENT:     Head: Normocephalic and atraumatic.     Right Ear: External ear normal.     Left Ear: External ear normal.  Eyes:     General: No scleral icterus.       Right eye: No discharge.        Left eye: No discharge.     Conjunctiva/sclera: Conjunctivae normal.  Neck:     Thyroid: No thyromegaly.  Cardiovascular:     Rate and Rhythm: Normal rate and regular rhythm.  Pulmonary:     Effort: No respiratory distress.     Breath sounds: Normal breath sounds. No wheezing.  Abdominal:     General: Bowel sounds are normal.     Palpations: Abdomen is soft.     Tenderness: There is no abdominal tenderness.  Musculoskeletal:        General: No swelling or tenderness.     Cervical back: Neck supple. No tenderness.  Lymphadenopathy:     Cervical: No cervical adenopathy.  Skin:    Findings: No erythema or rash.  Neurological:     Mental Status: She is alert.  Psychiatric:        Mood and Affect: Mood normal.        Behavior: Behavior normal.     BP 110/60 (BP Location: Left Arm, Patient Position: Sitting)   Pulse 74   Temp (!) 97.5 F (36.4 C)   Ht 5' 2.99" (1.6 m)   Wt 205 lb 6.4 oz (93.2 kg)   SpO2 96%   BMI 36.39 kg/m  Wt Readings from Last 3 Encounters:  02/24/20 205 lb 6.4 oz (93.2 kg)  01/08/20 (!) 209 lb (94.8 kg)  12/17/19 210 lb (95.3 kg)     Lab Results  Component Value Date   WBC 9.2 12/17/2019   HGB   11.7 (L) 12/17/2019   HCT 35.1 (L) 12/17/2019   PLT 225.0 12/17/2019   GLUCOSE 104 (H) 12/17/2019   CHOL 161 10/07/2019   TRIG 96.0 10/07/2019   HDL 54.10 10/07/2019   LDLDIRECT 163.4 12/31/2012   LDLCALC 87 10/07/2019   ALT 3 12/17/2019   AST 12 12/17/2019   NA 141 12/17/2019   K 4.1 12/17/2019   CL 107 12/17/2019   CREATININE 1.12 12/17/2019   BUN 16 12/17/2019   CO2 27 12/17/2019   TSH 1.86 12/17/2019   INR 1.0 12/12/2012   HGBA1C 5.9 10/07/2019        Assessment & Plan:   Problem List Items Addressed This Visit    Unsteady gait    Previous dizziness/unsteadiness as outlined.  ER evaluation as outlined.  Is better.  Concern regarding acute episode and unsteady gait, etc.  Discussed further w/up.  She is agreeable. Obtain MRI brain.        Relevant Orders   MR Brain Wo Contrast   Swelling of lower extremity    Stable.  Follow.       Pure hypercholesterolemia    On crestor.  Low cholesterol diet and exercise.  Follow lipid panel and liver function tests.        Parkinson's disease (HCC)    Followed by neurology.  On sinemet.        Hypertension    Blood pressure doing well on amlodipine.  Follow pressures.  Follow metabolic panel.       Hyperlipidemia    On crestor.  Low cholesterol diet and exercise.  Follow lipid panel and liver function tests.        Hyperglycemia    Low carb diet and exercise.  Follow met b and a1c.       Fibromuscular dysplasia (HCC)    Previously saw [facility].  Continue risk factor modification.  Recommended f/u prn.       Depression, recurrent (HCC)    Stable. On citalopram.       CKD (chronic kidney disease), stage III    Continue to avoid antiinflammatories. Off hctz.  Follow metabolic panel.       Chronic obstructive pulmonary disease (HCC)    Breathing stable.       Relevant Medications   montelukast (SINGULAIR) 10 MG tablet   Anemia    Follow cbc.        Other Visit Diagnoses    Encounter for screening mammogram for malignant neoplasm of breast    -  Primary   Relevant Orders   MM 3D SCREEN BREAST BILATERAL   Need for immunization against influenza       Relevant Orders   Flu Vaccine QUAD High Dose(Fluad) (Completed)        , MD 

## 2020-02-27 ENCOUNTER — Encounter: Payer: Self-pay | Admitting: Internal Medicine

## 2020-02-28 ENCOUNTER — Telehealth: Payer: Self-pay | Admitting: Internal Medicine

## 2020-02-28 NOTE — Assessment & Plan Note (Signed)
Low carb diet and exercise.  Follow met b and a1c.  

## 2020-02-28 NOTE — Assessment & Plan Note (Signed)
Blood pressure doing well on amlodipine.  Follow pressures.  Follow metabolic panel.   

## 2020-02-28 NOTE — Assessment & Plan Note (Signed)
Stable. On citalopram.

## 2020-02-28 NOTE — Assessment & Plan Note (Signed)
Previously saw [facility].  Continue risk factor modification.  Recommended f/u prn.

## 2020-02-28 NOTE — Assessment & Plan Note (Signed)
On crestor.  Low cholesterol diet and exercise.  Follow lipid panel and liver function tests.   

## 2020-02-28 NOTE — Assessment & Plan Note (Signed)
Continue to avoid antiinflammatories. Off hctz.  Follow metabolic panel.

## 2020-02-28 NOTE — Assessment & Plan Note (Signed)
Followed by neurology.  On sinemet.

## 2020-02-28 NOTE — Assessment & Plan Note (Signed)
Follow cbc.  

## 2020-02-28 NOTE — Assessment & Plan Note (Signed)
Previous dizziness/unsteadiness as outlined.  ER evaluation as outlined.  Is better.  Concern regarding acute episode and unsteady gait, etc.  Discussed further w/up.  She is agreeable. Obtain MRI brain.

## 2020-02-28 NOTE — Telephone Encounter (Signed)
Please schedule pt for mammogram.

## 2020-02-28 NOTE — Assessment & Plan Note (Signed)
Stable.  Follow.   

## 2020-02-28 NOTE — Assessment & Plan Note (Signed)
Breathing stable.

## 2020-02-29 NOTE — Telephone Encounter (Signed)
Left message to return call 

## 2020-02-29 NOTE — Telephone Encounter (Signed)
Patient calling back in. Patient informed and verbalized understanding.  Given number to schedule, [phone].

## 2020-03-01 ENCOUNTER — Telehealth: Payer: Self-pay | Admitting: Internal Medicine

## 2020-03-01 NOTE — Telephone Encounter (Signed)
Pt needs a refill on ALPRAZolam (XANAX) 0.5 MG tablet and Flon[facility]

## 2020-03-02 MED ORDER — ALPRAZOLAM 0.5 MG PO TABS: ORAL_TABLET | ORAL | 0 refills | Status: DC

## 2020-03-02 MED ORDER — FLUTICASONE PROPIONATE 50 MCG/ACT NA SUSP
2.0000 | Freq: Every day | NASAL | 6 refills | Status: DC
Start: 2020-03-02 — End: 2020-09-19

## 2020-03-02 NOTE — Telephone Encounter (Signed)
rx sent in for flonase and xanax.

## 2020-03-02 NOTE — Addendum Note (Signed)
Addended by: Alisa Graff on: 03/02/2020 06:54 AM   Modules accepted: Orders

## 2020-03-08 ENCOUNTER — Ambulatory Visit
Admission: RE | Admit: 2020-03-08 | Discharge: 2020-03-08 | Disposition: A | Discharge status: Home / Self Care | Payer: Medicare Other | Source: Ambulatory Visit | Attending: Internal Medicine | Admitting: Internal Medicine

## 2020-03-08 ENCOUNTER — Other Ambulatory Visit: Payer: Self-pay

## 2020-03-08 DIAGNOSIS — R2681 Unsteadiness on feet: Secondary | ICD-10-CM | POA: Insufficient documentation

## 2020-03-08 DIAGNOSIS — R42 Dizziness and giddiness: Secondary | ICD-10-CM | POA: Diagnosis not present

## 2020-03-15 DIAGNOSIS — R0600 Dyspnea, unspecified: Secondary | ICD-10-CM | POA: Diagnosis not present

## 2020-03-15 DIAGNOSIS — J449 Chronic obstructive pulmonary disease, unspecified: Secondary | ICD-10-CM | POA: Diagnosis not present

## 2020-03-29 ENCOUNTER — Telehealth: Payer: Self-pay | Admitting: Internal Medicine

## 2020-03-29 NOTE — Telephone Encounter (Signed)
Received notification -  Overdue mammogram. Need to schedule.  Thanks.

## 2020-03-30 NOTE — Telephone Encounter (Signed)
Left detailed message letting patient know need to schedule mammogram

## 2020-04-01 ENCOUNTER — Other Ambulatory Visit: Payer: Self-pay

## 2020-04-01 ENCOUNTER — Telehealth: Payer: Self-pay | Admitting: Internal Medicine

## 2020-04-01 NOTE — Telephone Encounter (Signed)
Called and spoke to [employer]. Eduard Clos stated that Victoria Hanson was not at home currently but he will let her know that she can bring the paperwork to the office to be completed.

## 2020-04-01 NOTE — Telephone Encounter (Signed)
Patient wants to drop of jury duty paperwork for DR.Scott to sign stating due to her health she can not do jury duty.

## 2020-04-01 NOTE — Telephone Encounter (Signed)
Ok.  Need paperwork

## 2020-04-04 ENCOUNTER — Telehealth: Payer: Self-pay | Admitting: Internal Medicine

## 2020-04-04 NOTE — Telephone Encounter (Signed)
Paperwork was placed in provider folder marked forms

## 2020-04-04 NOTE — Telephone Encounter (Signed)
Patient dropped off forms to be dismissed from jury duty. Paper work is up front in Dr. Bary Leriche color folder.

## 2020-04-08 ENCOUNTER — Telehealth: Payer: Self-pay | Admitting: Internal Medicine

## 2020-04-08 NOTE — Telephone Encounter (Signed)
Letter sent for Patient to call and schedule

## 2020-04-08 NOTE — Telephone Encounter (Signed)
Received notification - pt overdue mammogram.  Last 2019.  Need to schedule.  Thanks.

## 2020-04-11 ENCOUNTER — Other Ambulatory Visit: Payer: Self-pay

## 2020-04-11 ENCOUNTER — Encounter: Payer: Medicare Other | Admitting: Dermatology

## 2020-04-11 ENCOUNTER — Ambulatory Visit: Payer: Medicare Other | Admitting: Dermatology

## 2020-04-11 DIAGNOSIS — D229 Melanocytic nevi, unspecified: Secondary | ICD-10-CM

## 2020-04-11 DIAGNOSIS — D225 Melanocytic nevi of trunk: Secondary | ICD-10-CM

## 2020-04-11 DIAGNOSIS — D235 Other benign neoplasm of skin of trunk: Secondary | ICD-10-CM

## 2020-04-11 DIAGNOSIS — D239 Other benign neoplasm of skin, unspecified: Secondary | ICD-10-CM

## 2020-04-11 DIAGNOSIS — Z1283 Encounter for screening for malignant neoplasm of skin: Secondary | ICD-10-CM | POA: Diagnosis not present

## 2020-04-11 DIAGNOSIS — D485 Neoplasm of uncertain behavior of skin: Secondary | ICD-10-CM | POA: Diagnosis not present

## 2020-04-11 DIAGNOSIS — L578 Other skin changes due to chronic exposure to nonionizing radiation: Secondary | ICD-10-CM

## 2020-04-11 DIAGNOSIS — D692 Other nonthrombocytopenic purpura: Secondary | ICD-10-CM

## 2020-04-11 DIAGNOSIS — D1800 Hemangioma unspecified site: Secondary | ICD-10-CM

## 2020-04-11 DIAGNOSIS — L821 Other seborrheic keratosis: Secondary | ICD-10-CM

## 2020-04-11 DIAGNOSIS — L918 Other hypertrophic disorders of the skin: Secondary | ICD-10-CM

## 2020-04-11 DIAGNOSIS — L820 Inflamed seborrheic keratosis: Secondary | ICD-10-CM

## 2020-04-11 DIAGNOSIS — D492 Neoplasm of unspecified behavior of bone, soft tissue, and skin: Secondary | ICD-10-CM

## 2020-04-11 NOTE — Progress Notes (Signed)
New Patient Visit  Subjective  Victoria Hanson is a 69 y.o. female who presents for the following: mole (back, PCP wanted her to have checked, no hx of Skin CA) and Mole check (Upper body skin exam).  She has a new growth on R cheek, stays scaly.  New patient referral from Dr. Einar Pheasant.  The following portions of the chart were reviewed this encounter and updated as appropriate:      Review of Systems:  No other skin or systemic complaints except as noted in HPI or Assessment and Plan.  Objective  Well appearing patient in no apparent distress; mood and affect are within normal limits.  All skin waist up examined.  Objective  Right spinal Upper Back above nevus: 5.101mm Firm brown papulenodule with dimple sign. See photo/need to rotate  Images    Objective  - Tan-brown and/or pink-flesh-colored symmetric macules and papules  R spinal upper back: 7.4mm speckled brown macule darker center- see photo, but need to rotate it  Mid sternum: 3.6mm med dark brown macule  Right Breast: 4.0 x 3.2mm med dark brown macule  Images    Objective  Left Lower Back: 4.75mm med dark brown macule- see photo/need to rotate     Objective  R zygoma x 1: Erythematous keratotic or waxy stuck-on papule or plaque.    Assessment & Plan    Melanocytic Nevi - Tan-brown and/or pink-flesh-colored symmetric macules and papules - Benign appearing on exam today - Observation - Call clinic for new or changing moles - Recommend daily use of broad spectrum spf 30+ sunscreen to sun-exposed areas.   Purpura - Violaceous macules and patches arms/hands - Benign - Related to age, sun damage and/or use of blood thinners - Observe - Can use OTC arnica containing moisturizer such as Dermend Bruise Formula if desired - Call for worsening or other concerns - Recommend Amlactin lotion qd  Dermatofibroma Right spinal Upper Back above nevus  Benign growth possibly related to trauma,  such as an insect bite.  Discussed removal (shave vrs excision), resulting scar and risk of recurrence. Since not bothersome, will observe for now.   Nevus (3) Mid sternum; Right Breast; R spinal upper back  Neoplasm of skin Left Lower Back  Epidermal / dermal shaving  Lesion diameter (cm):  0.4 Informed consent: discussed and consent obtained   Patient was prepped and draped in usual sterile fashion: area prepped with alcohol. Anesthesia: the lesion was anesthetized in a standard fashion   Anesthetic:  1% lidocaine w/ epinephrine 1-100,000 buffered w/ 8.4% NaHCO3 Instrument used: flexible razor blade   Hemostasis achieved with: pressure, aluminum chloride and electrodesiccation   Outcome: patient tolerated procedure well   Post-procedure details: sterile dressing applied and wound care instructions given   Post-procedure details comment:  Ointment and small bandage applied Dressing type: petrolatum and bandage   Additional details:  Post txt defect 0.6cm  Specimen 1 - Surgical pathology Differential Diagnosis: D48.5 Nevus vs Dysplastic nevus Check Margins: yes 4.48mm med dark brown macule  Inflamed seborrheic keratosis R zygoma x 1  Destruction of lesion - R zygoma x 1  Destruction method: cryotherapy   Informed consent: discussed and consent obtained   Lesion destroyed using liquid nitrogen: Yes   Region frozen until ice ball extended beyond lesion: Yes   Outcome: patient tolerated procedure well with no complications   Post-procedure details: wound care instructions given     Hemangiomas - Red papules - Discussed benign nature - Observe -  Call for any changes  Skin cancer screening performed today. UBSE  Acrochordons (Skin Tags) - Fleshy, skin-colored pedunculated papules axilla - Benign appearing.  - Observe. - If desired, they can be removed with an in office procedure that is not covered by insurance. - Please call the clinic if you notice any new or  changing lesions.  Seborrheic Keratoses - Stuck-on, waxy, tan-brown papules and plaques  - Discussed benign etiology and prognosis. - Observe - Call for any changes  Actinic Damage - diffuse scaly erythematous macules with underlying dyspigmentation - Recommend daily broad spectrum sunscreen SPF 30+ to sun-exposed areas, reapply every 2 hours as needed.  - Call for new or changing lesions.   Return in about 6 months (around 10/09/2020) for Recheck nevi.  I, Othelia Pulling, RMA, am acting as scribe for Brendolyn Patty, MD . Documentation: I have reviewed the above documentation for accuracy and completeness, and I agree with the above.  Brendolyn Patty MD

## 2020-04-11 NOTE — Patient Instructions (Signed)

## 2020-04-12 ENCOUNTER — Other Ambulatory Visit: Payer: Self-pay | Admitting: Internal Medicine

## 2020-04-14 DIAGNOSIS — J449 Chronic obstructive pulmonary disease, unspecified: Secondary | ICD-10-CM | POA: Diagnosis not present

## 2020-04-18 NOTE — Telephone Encounter (Signed)
Letter typed to excuse from jury duty.

## 2020-04-18 NOTE — Telephone Encounter (Signed)
Faxed and patient aware.

## 2020-04-18 NOTE — Telephone Encounter (Signed)
Pt called to follow up on form Please fax letter to [facility] [phone]

## 2020-04-18 NOTE — Telephone Encounter (Signed)
See other message

## 2020-04-18 NOTE — Telephone Encounter (Signed)
I do not have this paperwork and was not in the office. Do you have it?

## 2020-04-18 NOTE — Telephone Encounter (Signed)
Patient called in wanting to know about paperwork for jury duty she dropped off

## 2020-04-19 ENCOUNTER — Other Ambulatory Visit: Payer: Self-pay

## 2020-04-19 ENCOUNTER — Emergency Department
Admission: EM | Admit: 2020-04-19 | Discharge: 2020-04-20 | Disposition: A | Discharge status: Home / Self Care | Payer: Medicare Other | Attending: Emergency Medicine | Admitting: Emergency Medicine

## 2020-04-19 ENCOUNTER — Telehealth: Payer: Self-pay

## 2020-04-19 ENCOUNTER — Ambulatory Visit: Payer: Self-pay | Admitting: *Deleted

## 2020-04-19 DIAGNOSIS — R1031 Right lower quadrant pain: Secondary | ICD-10-CM | POA: Diagnosis not present

## 2020-04-19 DIAGNOSIS — Z7722 Contact with and (suspected) exposure to environmental tobacco smoke (acute) (chronic): Secondary | ICD-10-CM | POA: Insufficient documentation

## 2020-04-19 DIAGNOSIS — N183 Chronic kidney disease, stage 3 unspecified: Secondary | ICD-10-CM | POA: Insufficient documentation

## 2020-04-19 DIAGNOSIS — K5900 Constipation, unspecified: Secondary | ICD-10-CM | POA: Diagnosis not present

## 2020-04-19 DIAGNOSIS — Z85038 Personal history of other malignant neoplasm of large intestine: Secondary | ICD-10-CM | POA: Diagnosis not present

## 2020-04-19 DIAGNOSIS — R109 Unspecified abdominal pain: Secondary | ICD-10-CM

## 2020-04-19 DIAGNOSIS — R110 Nausea: Secondary | ICD-10-CM | POA: Diagnosis not present

## 2020-04-19 DIAGNOSIS — I129 Hypertensive chronic kidney disease with stage 1 through stage 4 chronic kidney disease, or unspecified chronic kidney disease: Secondary | ICD-10-CM | POA: Insufficient documentation

## 2020-04-19 DIAGNOSIS — Z79899 Other long term (current) drug therapy: Secondary | ICD-10-CM | POA: Diagnosis not present

## 2020-04-19 DIAGNOSIS — J45909 Unspecified asthma, uncomplicated: Secondary | ICD-10-CM | POA: Insufficient documentation

## 2020-04-19 DIAGNOSIS — R112 Nausea with vomiting, unspecified: Secondary | ICD-10-CM | POA: Diagnosis not present

## 2020-04-19 DIAGNOSIS — Z7982 Long term (current) use of aspirin: Secondary | ICD-10-CM | POA: Diagnosis not present

## 2020-04-19 DIAGNOSIS — J449 Chronic obstructive pulmonary disease, unspecified: Secondary | ICD-10-CM | POA: Insufficient documentation

## 2020-04-19 DIAGNOSIS — R1110 Vomiting, unspecified: Secondary | ICD-10-CM | POA: Diagnosis not present

## 2020-04-19 LAB — URINALYSIS, COMPLETE (UACMP) WITH MICROSCOPIC
Bacteria, UA: NONE SEEN
Bilirubin Urine: NEGATIVE
Glucose, UA: NEGATIVE mg/dL
Ketones, ur: NEGATIVE mg/dL
Nitrite: NEGATIVE
Protein, ur: NEGATIVE mg/dL
Specific Gravity, Urine: 1.008 (ref 1.005–1.030)
pH: 5 (ref 5.0–8.0)

## 2020-04-19 LAB — COMPREHENSIVE METABOLIC PANEL
ALT: 9 U/L (ref 0–44)
AST: 44 U/L — ABNORMAL HIGH (ref 15–41)
Albumin: 3.8 g/dL (ref 3.5–5.0)
Alkaline Phosphatase: 161 U/L — ABNORMAL HIGH (ref 38–126)
Anion gap: 10 (ref 5–15)
BUN: 17 mg/dL (ref 8–23)
CO2: 24 mmol/L (ref 22–32)
Calcium: 8.9 mg/dL (ref 8.9–10.3)
Chloride: 103 mmol/L (ref 98–111)
Creatinine, Ser: 1.11 mg/dL — ABNORMAL HIGH (ref 0.44–1.00)
GFR, Estimated: 53 mL/min — ABNORMAL LOW (ref >60)
Glucose, Bld: 107 mg/dL — ABNORMAL HIGH (ref 70–99)
Potassium: 3.8 mmol/L (ref 3.5–5.1)
Sodium: 137 mmol/L (ref 135–145)
Total Bilirubin: 0.7 mg/dL (ref 0.3–1.2)
Total Protein: 7.2 g/dL (ref 6.5–8.1)

## 2020-04-19 LAB — CBC
HCT: 38.2 % (ref 36.0–46.0)
Hemoglobin: 12.3 g/dL (ref 12.0–15.0)
MCH: 28.5 pg (ref 26.0–34.0)
MCHC: 32.2 g/dL (ref 30.0–36.0)
MCV: 88.6 fL (ref 80.0–100.0)
Platelets: 256 10*3/uL (ref 150–400)
RBC: 4.31 MIL/uL (ref 3.87–5.11)
RDW: 13.4 % (ref 11.5–15.5)
WBC: 7.7 10*3/uL (ref 4.0–10.5)
nRBC: 0 % (ref 0.0–0.2)

## 2020-04-19 LAB — LIPASE, BLOOD: Lipase: 21 U/L (ref 11–51)

## 2020-04-19 MED ORDER — ONDANSETRON HCL 4 MG/2ML IJ SOLN
4.0000 mg | INTRAMUSCULAR | Status: AC
  Administered 2020-04-20: 4 mg via INTRAVENOUS
  Filled 2020-04-19: qty 2

## 2020-04-19 MED ORDER — SODIUM CHLORIDE 0.9 % IV BOLUS
500.0000 mL | Freq: Once | INTRAVENOUS | Status: AC
  Administered 2020-04-20: 500 mL via INTRAVENOUS

## 2020-04-19 NOTE — Telephone Encounter (Signed)
-----   Message from Brendolyn Patty, MD sent at 04/18/2020  7:56 PM EST ----- Skin , left lower back DYSPLASTIC COMPOUND NEVUS WITH MODERATE ATYPIA, CLOSE TO MARGIN  Moderately atypical mole, observe for recurrence

## 2020-04-19 NOTE — Telephone Encounter (Signed)
Advised patient of her biopsy results. Patient will keep follow-up appointment.

## 2020-04-19 NOTE — Telephone Encounter (Signed)
Patient is having R side pain, nausea, constipation- no BM for days.Hot and cold spells. Patient had called wanting to schedule a COVID test- call was forwarded by agent for triage. Patient advised with her current symptoms go to ED.  Reason for Disposition . [1] SEVERE pain AND [2] age > 60 years  Answer Assessment - Initial Assessment Questions 1. LOCATION: "Where does it hurt?"      R- lower pain 2. RADIATION: "Does the pain shoot anywhere else?" (e.g., chest, back)     Sometimes to back 3. ONSET: "When did the pain begin?" (e.g., minutes, hours or days ago)      1 month- getting worse 4. SUDDEN: "Gradual or sudden onset?"     gradual 5. PATTERN "Does the pain come and go, or is it constant?"    - If constant: "Is it getting better, staying the same, or worsening?"      (Note: Constant means the pain never goes away completely; most serious pain is constant and it progresses)     - If intermittent: "How long does it last?" "Do you have pain now?"     (Note: Intermittent means the pain goes away completely between bouts)     Constant- pain now 6. SEVERITY: "How bad is the pain?"  (e.g., Scale 1-10; mild, moderate, or severe)   - MILD (1-3): doesn't interfere with normal activities, abdomen soft and not tender to touch    - MODERATE (4-7): interferes with normal activities or awakens from sleep, tender to touch    - SEVERE (8-10): excruciating pain, doubled over, unable to do any normal activities      Severe 6-7 7. RECURRENT SYMPTOM: "Have you ever had this type of stomach pain before?" If Yes, ask: "When was the last time?" and "What happened that time?"      Never had pain like this 8. CAUSE: "What do you think is causing the stomach pain?"     unsure 9. RELIEVING/AGGRAVATING FACTORS: "What makes it better or worse?" (e.g., movement, antacids, bowel movement)     no 10. OTHER SYMPTOMS: "Has there been any vomiting, diarrhea, constipation, or urine problems?"        Nausea,constipation 11. PREGNANCY: "Is there any chance you are pregnant?" "When was your last menstrual period?"       n/a  Protocols used: ABDOMINAL PAIN - [facility]

## 2020-04-19 NOTE — ED Triage Notes (Signed)
Pt come via POV from home with c/o right sided pain and some nausea. Pt states this months ago. Pt states nausea started 3-4 days ago.  Pt states some diarrhea.

## 2020-04-20 ENCOUNTER — Encounter: Payer: Self-pay | Admitting: Radiology

## 2020-04-20 ENCOUNTER — Emergency Department: Payer: Medicare Other

## 2020-04-20 DIAGNOSIS — R112 Nausea with vomiting, unspecified: Secondary | ICD-10-CM | POA: Diagnosis not present

## 2020-04-20 DIAGNOSIS — R1110 Vomiting, unspecified: Secondary | ICD-10-CM | POA: Diagnosis not present

## 2020-04-20 MED ORDER — IOHEXOL 300 MG/ML  SOLN
100.0000 mL | Freq: Once | INTRAMUSCULAR | Status: AC | PRN
  Administered 2020-04-20: 100 mL via INTRAVENOUS

## 2020-04-20 MED ORDER — ONDANSETRON 4 MG PO TBDP: ORAL_TABLET | ORAL | 0 refills | Status: DC

## 2020-04-20 NOTE — ED Provider Notes (Signed)
[facility] Emergency Department Provider Note  ____________________________________________   First MD Initiated Contact with Patient 04/19/20 2323     (approximate)  I have reviewed the triage vital signs and the nursing notes.   HISTORY  Chief Complaint Abdominal Pain    HPI Victoria Hanson is a 69 y.o. female with medical history as listed below who presents for evaluation of 3 to 4 days of severe nausea as well as worsening but intermittent right lower quadrant and somewhat lateral abdominal pain .  She said that she has had this intermittent abdominal pain for months.  It seems to be worse in certain positions or if she goes to sleep on her left side.  It also seems to worse after she eats.  She is status post cholecystectomy and multiple other abdominal surgeries but has never had an appendectomy.  She has been seen by her regular doctor who "did some tests" but could not find a source of the abdominal pain.  The more acute and severe issue at this time is the nausea.  She said that when it started 3 to 4 days ago she had nausea associated with numerous episodes of diarrhea.  She took some over-the-counter diarrhea medicine and since that time she has been constipated.  She said she has had "horrible gas" in addition to the nausea but no vomiting.  She has had decreased oral intake given the nausea and the abdominal discomfort that is worse when she tries to eat or drink anything.  She describes the pain in the right lateral lower part of her abdomen as sharp and stabbing and worse with position.  She denies fever/chills, sore throat, chest pain, shortness of breath, and dysuria.  Nothing particular makes the symptoms better.        Past Medical History:  Diagnosis Date  . Anxiety   . Asthma   . Atypical chest pain    a. 2003 Cath: reportedly nl per pt [name]);  b. 04/2015 [place] Admission, neg troponin.  . Colon cancer ([place]) 1999   a. 1999: Pt had  partial colectomy. Did develop lung metastasis. Had right upper lobectomy in 01/2007 then had associated chemotherapy.  Marland Kitchen COPD (chronic obstructive pulmonary disease) ([place])   . DDD (degenerative disc disease), lumbosacral   . Depression   . Echocardiogram abnormal    a. 02/2010 Echo: EF 55-60%, mild LAE, no regional wall motion abnormalities  . Elevated transaminase level    a. ? NASH;  b. 06/2015 - nl LFTs.  . GERD (gastroesophageal reflux disease)   . Hematuria   . Holter monitor, abnormal    a. 12/2009 Holter monitoring: NSR, rare PACs and PVCs.  Marland Kitchen Hypercholesterolemia   . Hyperlipidemia   . Hypertension   . Leaky heart valve    a. Per pt report - no evidence of valvular abnormalities on prior echoes.  . Lesion of skin of Right breast 10/20/2017  . Lung metastases ([place]) 2008   a. S/P resection (lobectomy - right)  . Morbid obesity ([place])   . Nephrolithiasis   . Parkinson disease ([place]) 08/2016   [name]    Patient Active Problem List   Diagnosis Date Noted  . Constipation 11/11/2019  . Anemia 11/11/2019  . Near syncope 02/13/2019  . Fibromuscular dysplasia ([place]) 02/02/2019  . Abdominal bloating 01/04/2019  . Unsteady gait 12/03/2018  . History of UTI 10/08/2018  . Ear fullness, right 10/08/2018  . B12 deficiency 03/11/2018  . Discharge from right  nipple 11/10/2017  . Goals of care, counseling/discussion 10/15/2017  . Parkinson's disease ([place]) 07/31/2017  . Chronic obstructive pulmonary disease ([place]) 07/31/2017  . REM behavioral disorder 05/28/2017  . Elevated liver function tests 08/25/2016  . Carotid stenosis 07/19/2016  . Tremor 06/01/2016  . Osteoporosis 04/08/2016  . Foot pain, bilateral 07/24/2015  . Head pain 07/24/2015  . Hyperlipidemia 06/21/2015  . Other fatigue 06/21/2015  . BMI 33.0-33.9,adult 06/21/2015  . Disorder of kidney and ureter 03/28/2015  . Hyperglycemia 03/20/2015  . Swelling of lower extremity 11/20/2014  . Dysuria 10/24/2014  . Health care  maintenance 07/17/2014  . Hypokalemia 04/05/2013  . TIA (transient ischemic attack) 02/09/2013  . Transient cerebral ischemia 02/09/2013  . Anxiety, mild 03/13/2012  . Depression, recurrent ([place]) 03/13/2012  . History of colon cancer 03/13/2012  . Hypertension 03/13/2012  . CKD (chronic kidney disease), stage III ([place]) 03/13/2012  . GERD (gastroesophageal reflux disease) 03/13/2012  . Acid reflux 03/13/2012  . History of malignant neoplasm metastatic to lung 03/13/2012  . Major depressive disorder with single episode 03/13/2012  . Hypercholesterolemia 02/02/2010  . SOB (shortness of breath) 02/02/2010  . Pure hypercholesterolemia 02/02/2010    Past Surgical History:  Procedure Laterality Date  . BREAST EXCISIONAL BIOPSY Right 10/07/2009   benign  . CARDIAC CATHETERIZATION  2003   negative as per pt report  . CHOLECYSTECTOMY  2000  . COLON SURGERY     Colon cancer, removed part of large colon  . COLONOSCOPY N/A 04/25/2015   Procedure: COLONOSCOPY;  Surgeon: Manya Silvas, MD;  Location[facility] ENDOSCOPY;  Service: Endoscopy;  Laterality: N/A;  . COLONOSCOPY WITH PROPOFOL N/A 06/14/2017   Procedure: COLONOSCOPY WITH PROPOFOL;  Surgeon: Manya Silvas, MD;  Location: [facility] ENDOSCOPY;  Service: Endoscopy;  Laterality: N/A;  . COLONOSCOPY WITH PROPOFOL N/A 11/17/2019   Procedure: COLONOSCOPY WITH PROPOFOL;  Surgeon: [name], MD;  Location: [facility] ENDOSCOPY;  Service: Gastroenterology;  Laterality: N/A;  . LOBECTOMY  01/2007   Right lung, upper lobe right side removed  . TOTAL ABDOMINAL HYSTERECTOMY     abnormal bleeding  . TUBAL LIGATION  1980    Prior to Admission medications   Medication Sig Start Date End Date Taking? Authorizing Provider  ALPRAZolam Duanne Moron) 0.5 MG tablet Take 1/2 tablet q day prn 03/02/20   Einar Pheasant, MD  amLODipine (NORVASC) 2.5 MG tablet Take 2.5 mg by mouth daily. 11/10/19   [provider]  amoxicillin-clavulanate (AUGMENTIN) 875-125 MG  tablet Take 1 tablet by mouth 2 (two) times daily. X 7-10 days 01/08/20   McLean-Scocuzza, Nino Glow, MD  aspirin EC 81 MG tablet Take 81 mg by mouth as needed.     [provider]  calcium-vitamin D (OSCAL WITH D) 500-200 MG-UNIT tablet Take 1 tablet by mouth.    [provider]  carbidopa-levodopa (SINEMET) 25-100 MG tablet Take 1.5 in am and one at 11:00 and one in afternoon and 1.5 at night. 06/23/19   Einar Pheasant, MD  citalopram (CELEXA) 10 MG tablet TAKE ONE AND ONE-HALF TABLET BY MOUTH DAILY 04/12/20   Einar Pheasant, MD  ferrous sulfate 325 (65 FE) MG tablet Take 325 mg by mouth daily with breakfast.     [provider]  fluticasone (FLONASE) 50 MCG/ACT nasal spray Place 2 sprays into both nostrils daily. 03/02/20   Einar Pheasant, MD  fluticasone-salmeterol (ADVAIR HFA) 517-61 MCG/ACT inhaler Inhale 2 puffs into the lungs 2 (two) times daily. 10/15/19   Tyler Pita,  MD  montelukast (SINGULAIR) 10 MG tablet Take by mouth. 01/13/20 01/12/21  [provider]  mupirocin ointment (BACTROBAN) 2 % Apply 1 application topically 3 (three) times daily. 01/08/20   McLean-Scocuzza, Nino Glow, MD  omeprazole (PRILOSEC) 20 MG capsule Take 1 capsule (20 mg total) by mouth 2 (two) times daily before a meal. 06/23/19   Einar Pheasant, MD  ondansetron (ZOFRAN ODT) 4 MG disintegrating tablet Allow 1-2 tablets to dissolve in your mouth every 8 hours as needed for nausea/vomiting 04/20/20   Hinda Kehr, MD  PROAIR HFA 108 [phone] Base) MCG/ACT inhaler INHALE 2 PUFFS BY MOUTH INTO THE LUNGS EVERY 6 HOURS AS NEEDED FOR WHEEZING 11/20/19   Einar Pheasant, MD  rosuvastatin (CRESTOR) 5 MG tablet TAKE 1 TABLET BY MOUTH ON Adelfa Koh [facility] AND FRIDAY 04/12/20   Einar Pheasant, MD  Spacer/Aero-Holding Chambers (EASIVENT) inhaler See admin instructions. 01/13/20 01/12/21  [provider]  traZODone (DESYREL) 50 MG tablet TAKE 2 TABLETS BY MOUTH AT BEDTIME 04/12/20   Einar Pheasant, MD   vitamin B-12 (CYANOCOBALAMIN) 100 MCG tablet Take 100 mcg by mouth daily.    [provider]    Allergies Macrobid [nitrofurantoin macrocrystal]; Antihistamines, diphenhydramine-type; Aspirin; Ceftin [cefuroxime axetil]; Celecoxib; Cymbalta [duloxetine hcl]; Diphenhydramine; Escitalopram; Lexapro [escitalopram oxalate]; Metronidazole; Nitrofurantoin; Zoloft [sertraline hcl]; and Ciprofloxacin  Family History  Problem Relation Age of Onset  . Coronary artery disease Mother        died @ 38 of MI.  . Mental illness Mother   . Diabetes Mother   . Heart attack Father 27       MI  . Cancer Father        lung - died in his mid-70's  . Hypertension Father   . Diabetes Father   . Sudden death Brother   . Mental illness Brother   . Heart attack Brother        MI @ 63, died @ age 67.  . Breast cancer Neg Hx     Social History Social History   Tobacco Use  . Smoking status: Passive Smoke Exposure - Never Smoker  . Smokeless tobacco: Never Used  . Tobacco comment: husband and son smoked in her home.  Vaping Use  . Vaping Use: Never used  Substance Use Topics  . Alcohol use: Not Currently    Alcohol/week: 0.0 standard drinks  . Drug use: No    Review of Systems Constitutional: No fever/chills Eyes: No visual changes. ENT: No sore throat. Cardiovascular: Denies chest pain. Respiratory: Denies shortness of breath. Gastrointestinal: Acute nausea, no vomiting, several days of constipation after taking medicine for diarrhea.  Worsening chronic right lower lateral abdominal pain. Genitourinary: Negative for dysuria. Musculoskeletal: Negative for neck pain.  Negative for back pain. Integumentary: Negative for rash. Neurological: Negative for headaches, focal weakness or numbness.   ____________________________________________   PHYSICAL EXAM:  VITAL SIGNS: ED Triage Vitals  Enc Vitals Group     BP 04/19/20 1700 (!) 153/70     Pulse Rate 04/19/20 1700 74     Resp  04/19/20 1700 18     Temp 04/19/20 1700 98.6 F (37 C)     Temp Source 04/19/20 1700 Oral     SpO2 04/19/20 1700 96 %     Weight 04/19/20 1701 94.3 kg (208 lb)     Height 04/19/20 1701 1.6 m ($Remo'5\' 3"'GOzKD$ )     Head Circumference --      Peak Flow --  Pain Score 04/19/20 1719 6     Pain Loc --      Pain Edu? --      Excl. in GC? --     Constitutional: Alert and oriented.  Eyes: Conjunctivae are normal.  Head: Atraumatic. Nose: No congestion/rhinnorhea. Mouth/Throat: Patient is wearing a mask. Neck: No stridor.  No meningeal signs.   Cardiovascular: Normal rate, regular rhythm. Good peripheral circulation. Grossly normal heart sounds. Respiratory: Normal respiratory effort.  No retractions. Gastrointestinal: Soft and nondistended.  Obese.  Mild tenderness to palpation of the right lower quadrant, more lateral than one would expect from appendicitis.  No rebound or guarding. Musculoskeletal: No lower extremity tenderness nor edema. No gross deformities of extremities. Neurologic:  Normal speech and language. No gross focal neurologic deficits are appreciated.  Skin:  Skin is warm, dry and intact. Psychiatric: Mood and affect are normal. Speech and behavior are normal.  ____________________________________________   LABS (all labs ordered are listed, but only abnormal results are displayed)  Labs Reviewed  COMPREHENSIVE METABOLIC PANEL - Abnormal; Notable for the following components:      Result Value   Glucose, Bld 107 (*)    Creatinine, Ser 1.11 (*)    AST 44 (*)    Alkaline Phosphatase 161 (*)    GFR, Estimated 53 (*)    All other components within normal limits  URINALYSIS, COMPLETE (UACMP) WITH MICROSCOPIC - Abnormal; Notable for the following components:   Color, Urine YELLOW (*)    APPearance CLEAR (*)    Hgb urine dipstick SMALL (*)    Leukocytes,Ua TRACE (*)    All other components within normal limits  LIPASE, BLOOD  CBC    ____________________________________________  EKG  No indication for emergent EKG  ____________________________________________  RADIOLOGY I, Loleta Rose, personally viewed and evaluated these images (plain radiographs) as part of my medical decision making, as well as reviewing the written report by the radiologist.  ED MD interpretation: Large colonic stool burden, biliary ductal dilatation of unclear clinical significance.  Official radiology report(s): CT ABDOMEN PELVIS W CONTRAST  Result Date: 04/20/2020 CLINICAL DATA:  Nausea and vomiting. Bowel obstruction. Right-sided pain with some nausea. EXAM: CT ABDOMEN AND PELVIS WITH CONTRAST TECHNIQUE: Multidetector CT imaging of the abdomen and pelvis was performed using the standard protocol following bolus administration of intravenous contrast. CONTRAST:  OMNIPAQUE IOHEXOL 300 MG/ML  SOLN COMPARISON:  01/30/2019 FINDINGS: Lower chest: The lung bases are clear. The heart is enlarged. Hepatobiliary: The liver is normal. Status post cholecystectomy.Again noted is intrahepatic and extrahepatic biliary ductal dilatation, slightly progressed since prior study. Pancreas: Normal contours without ductal dilatation. No peripancreatic fluid collection. Spleen: Unremarkable. Adrenals/Urinary Tract: --Adrenal glands: Unremarkable. --Right kidney/ureter: No hydronephrosis or radiopaque kidney stones. --Left kidney/ureter: No hydronephrosis or radiopaque kidney stones. --Urinary bladder: Unremarkable. Stomach/Bowel: --Stomach/Duodenum: There is a small hiatal hernia. --Small bowel: Unremarkable. --Colon: There is a large amount of stool throughout the colon. --Appendix: Normal. Vascular/Lymphatic: Atherosclerotic calcification is present within the non-aneurysmal abdominal aorta, without hemodynamically significant stenosis. --No retroperitoneal lymphadenopathy. --No mesenteric lymphadenopathy. --No pelvic or inguinal lymphadenopathy. Reproductive:  Status post hysterectomy. No adnexal mass. Other: No ascites or free air. Multiple fat containing ventral wall hernias are again noted. Musculoskeletal. No acute displaced fractures. IMPRESSION: 1. No acute abdominopelvic abnormality. 2. Large amount of stool throughout the colon. 3. Small hiatal hernia. 4. Persistent intrahepatic and extrahepatic biliary ductal dilatation. Correlation with laboratory studies is recommended. Aortic Atherosclerosis (ICD10-I70.0). Electronically Signed   By: Cristal Deer  Green M.D.   On: 04/20/2020 00:56    ____________________________________________   PROCEDURES   Procedure(s) performed (including Critical Care):  Procedures   ____________________________________________   INITIAL IMPRESSION / MDM / [place] / ED COURSE  As part of my medical decision making, I reviewed the following data within the [place] History obtained from family, Nursing notes reviewed and incorporated, Labs reviewed , Old chart reviewed and Notes from prior ED visits   Differential diagnosis includes, but is not limited to, SBO/ileus or partial SBO, musculoskeletal strain, constipation, nonspecific GI infection (viral or foodborne), appendicitis, less likely kidney/ureteral stone.  The patient has had a long-term abdominal pain and I think it is less likely that I will identify specific acute or emergent cause of the pain.  However she has a history of extensive abdominal surgeries including cholecystectomy and colon cancer.  It is possible she is having a partial SBO leading to her flatus, nausea, and decreased oral intake.  She also had numerous episodes of diarrhea prior to the onset of constipation and she could have a nonspecific colitis but then constipated herself with the over-the-counter antidiarrheal medicine.  Her abdominal exam is generally benign but given her age and constellation of symptoms I am going to obtain a CT scan with IV contrast  for further assessment.  The urinalysis is reassuring with no evidence of infection and the CBC is normal with no leukocytosis.  CMP is notable for a very slightly elevated creatinine 1.1 which is not clinically significant and a very slight elevation of both her AST and alk phos which is nonspecific and of uncertain clinical significance given that she is status post cholecystectomy.  Lipase is normal.  Vital signs been stable other than gradually increasing hypertension after waiting more than 7 hours to be seen due to overwhelming ED and hospital patient volumes.  This is likely due to missing her regular antihypertensives.      Clinical Course as of Apr 21 139  Wed Apr 20, 2020  0133 The patient's CT scan is essentially negative for acute abnormality. She has a large colonic stool burden which I suspect is the source of the majority of her symptoms. They also mentioned the ductal dilatation of the biliary system and suggested correlation with her labs and as previously described, she has a very mild elevation of AST and alkaline phosphatase.I had a long discussion with her and her family member about the results and explained that it is possible to develop "gallstones" even after her gallbladder is removed, especially 21 years after the surgery as it has been for her. However I explained that it is unlikely to be a critical or emergent issue although she could have stones in her biliary system. I offered an MRCP tonight versus outpatient follow-up. After we discussed it for little while, she prefers to go home and follow-up with her regular doctor and/or with GI. I feel that is appropriate given the very low probability that she has choledocholithiasis status post cholecystectomy, but I stressed to her the importance of outpatient follow-up and return to the ED if she develops new or worsening symptoms. She and her family member understand and agree with the plan.  CT ABDOMEN PELVIS W CONTRAST [CF]     Clinical Course User Index [CF] Hinda Kehr, MD     ____________________________________________  FINAL CLINICAL IMPRESSION(S) / ED DIAGNOSES  Final diagnoses:  Nausea  Right sided abdominal pain  Constipation, unspecified constipation type  MEDICATIONS GIVEN DURING THIS VISIT:  Medications  ondansetron (ZOFRAN) injection 4 mg (4 mg Intravenous Given 04/20/20 0059)  sodium chloride 0.9 % bolus 500 mL (0 mLs Intravenous Stopped 04/20/20 0138)  iohexol (OMNIPAQUE) 300 MG/ML solution 100 mL (100 mLs Intravenous Contrast Given 04/20/20 0035)     ED Discharge Orders         Ordered    ondansetron (ZOFRAN ODT) 4 MG disintegrating tablet        04/20/20 0135          *Please note:  Victoria APPENZELLER was evaluated in Emergency Department on 04/20/2020 for the symptoms described in the history of present illness. She was evaluated in the context of the global COVID-19 pandemic, which necessitated consideration that the patient might be at risk for infection with the SARS-CoV-2 virus that causes COVID-19. Institutional protocols and algorithms that pertain to the evaluation of patients at risk for COVID-19 are in a state of rapid change based on information released by regulatory bodies including the CDC and federal and state organizations. These policies and algorithms were followed during the patient's care in the ED.  Some ED evaluations and interventions may be delayed as a result of limited staffing during and after the pandemic.*  Note:  This document was prepared using Dragon voice recognition software and may include unintentional dictation errors.   Hinda Kehr, MD 04/20/20 0140

## 2020-04-20 NOTE — Discharge Instructions (Signed)
As we discussed, your results tonight were generally reassuring. Most likely you are experiencing nausea and pain as result of being constipated because your CT scan shows a lot of stool throughout your colon. I recommend you try taking daily MiraLAX according to label instructions, as well as over-the-counter Colace/docusate (a stool softener). This should be relatively gentle but help get your bowels moving again. Use the Zofran you have available at home and I also gave you a new prescription for Zofran as needed for nausea.  We also discussed the possibility that you could have a stone in your bile duct. This is rare but can happen in decades after having your gallbladder removed. You had some ductal dilatation identified by radiology on your CT scan which could be normal or could be the result of having a stone. You have a very slight elevation of a couple of your LFTs (specifically the AST and alkaline phosphatase), which again could be fine, or could indicate the presence of a stone. We talked about doing an MRCP tonight but because it does not seem to be emergent and you would prefer to go home and follow-up as an outpatient. We agree this is appropriate given that your symptoms are most likely just due to constipation, but we recommend you follow-up with your primary care doctor and/or with a gastroenterologist and see how your symptoms are doing and if they feel it would be advisable to order an outpatient studies such as an MRCP.    Return to the emergency department if you develop new or worsening symptoms that concern you.

## 2020-04-21 ENCOUNTER — Telehealth: Payer: Self-pay | Admitting: Internal Medicine

## 2020-04-21 ENCOUNTER — Telehealth (INDEPENDENT_AMBULATORY_CARE_PROVIDER_SITE_OTHER): Payer: Medicare Other | Admitting: Internal Medicine

## 2020-04-21 ENCOUNTER — Other Ambulatory Visit: Payer: Self-pay

## 2020-04-21 DIAGNOSIS — R739 Hyperglycemia, unspecified: Secondary | ICD-10-CM

## 2020-04-21 DIAGNOSIS — G20A1 Parkinson's disease without dyskinesia, without mention of fluctuations: Secondary | ICD-10-CM

## 2020-04-21 DIAGNOSIS — K219 Gastro-esophageal reflux disease without esophagitis: Secondary | ICD-10-CM

## 2020-04-21 DIAGNOSIS — R7989 Other specified abnormal findings of blood chemistry: Secondary | ICD-10-CM

## 2020-04-21 DIAGNOSIS — F419 Anxiety disorder, unspecified: Secondary | ICD-10-CM | POA: Diagnosis not present

## 2020-04-21 DIAGNOSIS — J449 Chronic obstructive pulmonary disease, unspecified: Secondary | ICD-10-CM

## 2020-04-21 DIAGNOSIS — I773 Arterial fibromuscular dysplasia: Secondary | ICD-10-CM

## 2020-04-21 DIAGNOSIS — N183 Chronic kidney disease, stage 3 unspecified: Secondary | ICD-10-CM

## 2020-04-21 DIAGNOSIS — D649 Anemia, unspecified: Secondary | ICD-10-CM | POA: Diagnosis not present

## 2020-04-21 DIAGNOSIS — F339 Major depressive disorder, recurrent, unspecified: Secondary | ICD-10-CM

## 2020-04-21 DIAGNOSIS — Z85038 Personal history of other malignant neoplasm of large intestine: Secondary | ICD-10-CM

## 2020-04-21 DIAGNOSIS — E7800 Pure hypercholesterolemia, unspecified: Secondary | ICD-10-CM

## 2020-04-21 DIAGNOSIS — I10 Essential (primary) hypertension: Secondary | ICD-10-CM

## 2020-04-21 DIAGNOSIS — K5900 Constipation, unspecified: Secondary | ICD-10-CM

## 2020-04-21 DIAGNOSIS — G20 Parkinson's disease: Secondary | ICD-10-CM

## 2020-04-21 NOTE — Telephone Encounter (Signed)
Lm with daughter to have patient call the office to schedule non-fasting labs. Dr. Nicki Reaper wants to see her in 5 weeks, this appointment has been schedule and daughter will let patient know.

## 2020-04-21 NOTE — Progress Notes (Signed)
Patient ID: Victoria Hanson, female   DOB: June 27, 1950, 69 y.o.   MRN: [id number]   Virtual Visit via telephone Note  This visit type was conducted due to national recommendations for restrictions regarding the COVID-19 pandemic (e.g. social distancing).  This format is felt to be most appropriate for this patient at this time.  All issues noted in this document were discussed and addressed.  No physical exam was performed (except for noted visual exam findings with Video Visits).   I connected with Victoria Hanson by telephone and verified that I am speaking with the correct person using two identifiers. Location patient: home Location provider: work Persons participating in the telephone visit: patient, provider  The limitations, risks, security and privacy concerns of performing an evaluation and management service by telephone and the availability of in person appointments have been discussed.  It has also been discussed with the patient that there may be a patient responsible charge related to this service. The patient expressed understanding and agreed to proceed.   Reason for visit: follow up appt.   HPI: Here for scheduled follow up.  Was seen in ER 04/19/20 with nausea and right lateral and lower abdominal pain.  Pain reported as intermittent for months.  Worse if lies on left side.  Also was experiencing some diarrhea.  Took otc medication and the became constipated.  CT scan revealed no acute abnormality, large amount of stool throughout the colon, small hiatal hernia, persistent intrahepatic and extrahepatic biliary ductal dilatation.  It was felt that her pain was related to increased stool burden.  She is taking a stool softner.  Discussed using miralax.  No vomiting.  She is eating.  Feeling better.  Discussed CT scan results.  She also is seeing neurology for parkinsons.  She had questions if her medication could be contributing to some of her symptoms.  Discussed that I did not think  that above symptoms were related to her taking sinemet.  Has f/u with neurology soon.  Has been on celexa for depression.  States a family member has been on cymbalta and she would like to take this medication.  Discussed tapering off celexa and starting low dose cymbalta.  No chest pain or sob reported.     ROS: See pertinent positives and negatives per HPI.  Past Medical History:  Diagnosis Date  . Anxiety   . Asthma   . Atypical chest pain    a. 2003 Cath: reportedly nl per pt Victoria Hanson);  b. 04/2015 [place] Admission, neg troponin.  . Colon cancer ([place]) 1999   a. 1999: Pt had partial colectomy. Did develop lung metastasis. Had right upper lobectomy in 01/2007 then had associated chemotherapy.  Marland Kitchen COPD (chronic obstructive pulmonary disease) ([place])   . DDD (degenerative disc disease), lumbosacral   . Depression   . Echocardiogram abnormal    a. 02/2010 Echo: EF 55-60%, mild LAE, no regional wall motion abnormalities  . Elevated transaminase level    a. ? NASH;  b. 06/2015 - nl LFTs.  . GERD (gastroesophageal reflux disease)   . Hematuria   . Holter monitor, abnormal    a. 12/2009 Holter monitoring: NSR, rare PACs and PVCs.  Marland Kitchen Hypercholesterolemia   . Hyperlipidemia   . Hypertension   . Leaky heart valve    a. Per pt report - no evidence of valvular abnormalities on prior echoes.  . Lesion of skin of Right breast 10/20/2017  . Lung metastases ([place]) 2008   a. S/P resection (  lobectomy - right)  . Morbid obesity ([place])   . Nephrolithiasis   . Parkinson disease ([place]) 08/2016   Victoria Hanson    Past Surgical History:  Procedure Laterality Date  . BREAST EXCISIONAL BIOPSY Right 10/07/2009   benign  . CARDIAC CATHETERIZATION  2003   negative as per pt report  . CHOLECYSTECTOMY  2000  . COLON SURGERY     Colon cancer, removed part of large colon  . COLONOSCOPY N/A 04/25/2015   Procedure: COLONOSCOPY;  Surgeon: Victoria Silvas, MD;  Location: [facility] ENDOSCOPY;  Service: Endoscopy;   Laterality: N/A;  . COLONOSCOPY WITH PROPOFOL N/A 06/14/2017   Procedure: COLONOSCOPY WITH PROPOFOL;  Surgeon: Victoria Silvas, MD;  Location: [facility] ENDOSCOPY;  Service: Endoscopy;  Laterality: N/A;  . COLONOSCOPY WITH PROPOFOL N/A 11/17/2019   Procedure: COLONOSCOPY WITH PROPOFOL;  Surgeon: Victoria Bellows, MD;  Location: [facility] ENDOSCOPY;  Service: Gastroenterology;  Laterality: N/A;  . LOBECTOMY  01/2007   Right lung, upper lobe right side removed  . TOTAL ABDOMINAL HYSTERECTOMY     abnormal bleeding  . TUBAL LIGATION  1980    Family History  Problem Relation Age of Onset  . Coronary artery disease Mother        died @ 63 of MI.  . Mental illness Mother   . Diabetes Mother   . Heart attack Father 52       MI  . Cancer Father        lung - died in his mid-70's  . Hypertension Father   . Diabetes Father   . Sudden death Brother   . Mental illness Brother   . Heart attack Brother        MI @ 54, died @ age 74.  . Breast cancer Neg Hx     SOCIAL HX: reviewed.    Current Outpatient Medications:  .  ALPRAZolam (XANAX) 0.5 MG tablet, Take 1/2 tablet q day prn, Disp: 30 tablet, Rfl: 0 .  amLODipine (NORVASC) 2.5 MG tablet, Take 2.5 mg by mouth daily., Disp: , Rfl:  .  amoxicillin-clavulanate (AUGMENTIN) 875-125 MG tablet, Take 1 tablet by mouth 2 (two) times daily. X 7-10 days, Disp: 20 tablet, Rfl: 0 .  aspirin EC 81 MG tablet, Take 81 mg by mouth as needed. , Disp: , Rfl:  .  calcium-vitamin D (OSCAL WITH D) 500-200 MG-UNIT tablet, Take 1 tablet by mouth., Disp: , Rfl:  .  carbidopa-levodopa (SINEMET) 25-100 MG tablet, Take 1.5 in am and one at 11:00 and one in afternoon and 1.5 at night., Disp: 150 tablet, Rfl: 0 .  citalopram (CELEXA) 10 MG tablet, TAKE ONE AND ONE-HALF TABLET BY MOUTH DAILY, Disp: 90 tablet, Rfl: 1 .  ferrous sulfate 325 (65 FE) MG tablet, Take 325 mg by mouth daily with breakfast. , Disp: , Rfl:  .  fluticasone (FLONASE) 50 MCG/ACT nasal spray, Place 2 sprays into  both nostrils daily., Disp: 16 g, Rfl: 6 .  fluticasone-salmeterol (ADVAIR HFA) 115-21 MCG/ACT inhaler, Inhale 2 puffs into the lungs 2 (two) times daily., Disp: 1 Inhaler, Rfl: 4 .  montelukast (SINGULAIR) 10 MG tablet, Take by mouth., Disp: , Rfl:  .  mupirocin ointment (BACTROBAN) 2 %, Apply 1 application topically 3 (three) times daily., Disp: 30 g, Rfl: 0 .  omeprazole (PRILOSEC) 20 MG capsule, Take 1 capsule (20 mg total) by mouth 2 (two) times daily before a meal., Disp: 180 capsule, Rfl: 1 .  ondansetron (ZOFRAN ODT) 4 MG disintegrating  tablet, Allow 1-2 tablets to dissolve in your mouth every 8 hours as needed for nausea/vomiting, Disp: 30 tablet, Rfl: 0 .  PROAIR HFA 108 (90 Base) MCG/ACT inhaler, INHALE 2 PUFFS BY MOUTH INTO THE LUNGS EVERY 6 HOURS AS NEEDED FOR WHEEZING, Disp: 8.5 g, Rfl: 2 .  rosuvastatin (CRESTOR) 5 MG tablet, TAKE 1 TABLET BY MOUTH ON MONDAY, WEDNESDAY AND FRIDAY, Disp: 13 tablet, Rfl: 1 .  Spacer/Aero-Holding Chambers (EASIVENT) inhaler, See admin instructions., Disp: , Rfl:  .  traZODone (DESYREL) 50 MG tablet, TAKE 2 TABLETS BY MOUTH AT BEDTIME, Disp: 180 tablet, Rfl: 1 .  vitamin B-12 (CYANOCOBALAMIN) 100 MCG tablet, Take 100 mcg by mouth daily., Disp: , Rfl:   EXAM:  GENERAL: alert.  Sounds to be in no acute distress.  Answering questions appropriately.    PSYCH/NEURO: pleasant and cooperative, no obvious depression or anxiety, speech and thought processing grossly intact  ASSESSMENT AND PLAN:  Discussed the following assessment and plan:  Problem List Items Addressed This Visit    Parkinson's disease ([place])    On sinemet.  Do not feel sinemet contributing to her recent symptoms.  Keep f/u with neurology.       Hypertension    Blood pressure has done well on amlodipine.  Follow pressures.  Follow metabolic panel.       Relevant Orders   Basic metabolic panel   Hyperglycemia    Low carb diet and exercise.  Follow met b and a1c.        Hypercholesterolemia    On crestor.  Low cholesterol diet and exercise.  Follow lipid panel and liver function tests.        History of colon cancer    Has been followed by GI.  Colonoscopy 11/17/19.       GERD (gastroesophageal reflux disease)    No upper symptoms reported. On prilosec.       Fibromuscular dysplasia (HCC)    Previously evaluated by [facility].  Continue risk factor modification.        Elevated liver function tests    Slightly increased in ER.  CT as outlined.  Will have GI reevaluate given persistent intrahepatic and extrahepatic biliary ductal dilatation and slightly elevated AST and alkaline phosphatase in the setting of continued intermittent abdominal pain.        Relevant Orders   Hepatic function panel   Depression, recurrent ([place])    Has previously done well on citalopram.  Request to change to cymbalta.  Will taper citalopram and have her call with update.  Can start cymbalta at that time.  Follow.        Constipation    Recent ER evaluation and CT scan as outlined.  miralax and stool softener as directed.  Follow.       CKD (chronic kidney disease), stage III ([place])    Continue to avoid antiinflammatories.  Stay hydrated.  Follow metabolic panel.       Chronic obstructive pulmonary disease (HCC)    Breathing stable.  Has rescue inhaler if needed.  Continue advair.        Anxiety, mild    Has been on citalopram and has done well on this medication.  Request a change.  Wants to start cymbalta.  Discussed tapering citalopram.  Will notify with update.        Anemia    Follow cbc.        Acid reflux    Upper symptoms appear to be controlled on  prilosec.  Follow.           I discussed the assessment and treatment plan with the patient. The patient was provided an opportunity to ask questions and all were answered. The patient agreed with the plan and demonstrated an understanding of the instructions.   The patient was advised to call back or seek  an in-person evaluation if the symptoms worsen or if the condition fails to improve as anticipated.  I provided 25 minutes of non-face-to-face time during this encounter.   Einar Pheasant, MD

## 2020-04-28 DIAGNOSIS — G20 Parkinson's disease: Secondary | ICD-10-CM | POA: Diagnosis not present

## 2020-04-28 DIAGNOSIS — G4752 REM sleep behavior disorder: Secondary | ICD-10-CM | POA: Diagnosis not present

## 2020-05-01 ENCOUNTER — Telehealth: Payer: Self-pay | Admitting: Internal Medicine

## 2020-05-01 ENCOUNTER — Encounter: Payer: Self-pay | Admitting: Internal Medicine

## 2020-05-01 NOTE — Assessment & Plan Note (Signed)
Recent ER evaluation and CT scan as outlined.  miralax and stool softener as directed.  Follow.

## 2020-05-01 NOTE — Assessment & Plan Note (Signed)
Follow cbc.  

## 2020-05-01 NOTE — Assessment & Plan Note (Signed)
Continue to avoid antiinflammatories.  Stay hydrated.  Follow metabolic panel.

## 2020-05-01 NOTE — Assessment & Plan Note (Signed)
Breathing stable.  Has rescue inhaler if needed.  Continue advair.

## 2020-05-01 NOTE — Assessment & Plan Note (Signed)
Blood pressure has done well on amlodipine.  Follow pressures.  Follow metabolic panel.

## 2020-05-01 NOTE — Assessment & Plan Note (Signed)
Has been followed by GI.  Colonoscopy 11/17/19.

## 2020-05-01 NOTE — Assessment & Plan Note (Signed)
On sinemet.  Do not feel sinemet contributing to her recent symptoms.  Keep f/u with neurology.

## 2020-05-01 NOTE — Assessment & Plan Note (Signed)
Low carb diet and exercise.  Follow met b and a1c.  

## 2020-05-01 NOTE — Telephone Encounter (Signed)
Recently was seen in ER for abdominal pain.  After reviewing scans, labs - I feel she needs a f/u with GI for further evaluation to confirm no further w/up warranted.  If agreeable, let me know and will schedule f/u with GI.  She also needs a non fasting lab appt (to f/u on liver test) in 1-2 weeks.

## 2020-05-01 NOTE — Assessment & Plan Note (Signed)
Has been on citalopram and has done well on this medication.  Request a change.  Wants to start cymbalta.  Discussed tapering citalopram.  Will notify with update.

## 2020-05-01 NOTE — Assessment & Plan Note (Signed)
Previously evaluated by [facility].  Continue risk factor modification.

## 2020-05-01 NOTE — Assessment & Plan Note (Signed)
Slightly increased in ER.  CT as outlined.  Will have GI reevaluate given persistent intrahepatic and extrahepatic biliary ductal dilatation and slightly elevated AST and alkaline phosphatase in the setting of continued intermittent abdominal pain.

## 2020-05-01 NOTE — Assessment & Plan Note (Signed)
Upper symptoms appear to be controlled on prilosec.  Follow.

## 2020-05-01 NOTE — Assessment & Plan Note (Signed)
No upper symptoms reported. On prilosec.  

## 2020-05-01 NOTE — Assessment & Plan Note (Signed)
Has previously done well on citalopram.  Request to change to cymbalta.  Will taper citalopram and have her call with update.  Can start cymbalta at that time.  Follow.

## 2020-05-01 NOTE — Assessment & Plan Note (Signed)
On crestor.  Low cholesterol diet and exercise.  Follow lipid panel and liver function tests.   

## 2020-05-04 NOTE — Telephone Encounter (Signed)
LMTCB

## 2020-05-09 ENCOUNTER — Telehealth: Payer: Self-pay | Admitting: Internal Medicine

## 2020-05-09 NOTE — Telephone Encounter (Signed)
LMTCB

## 2020-05-09 NOTE — Telephone Encounter (Signed)
Pt called and said that she has tapered off the citalopram (CELEXA) 10 MG tablet and now needs the Cymbalta called into [facility]

## 2020-05-09 NOTE — Telephone Encounter (Signed)
What dose of cymbalta were you wanting to start her on?

## 2020-05-09 NOTE — Telephone Encounter (Signed)
She can start cymbalta 30mg  q day.

## 2020-05-09 NOTE — Telephone Encounter (Signed)
Left detailed msg for pt

## 2020-05-10 NOTE — Telephone Encounter (Signed)
Called pt. Unable to leave message. Has appt on 12/16.

## 2020-05-12 ENCOUNTER — Telehealth: Payer: Self-pay | Admitting: Internal Medicine

## 2020-05-12 ENCOUNTER — Other Ambulatory Visit: Payer: Self-pay

## 2020-05-12 DIAGNOSIS — M7751 Other enthesopathy of right foot: Secondary | ICD-10-CM | POA: Diagnosis not present

## 2020-05-12 DIAGNOSIS — S92511A Displaced fracture of proximal phalanx of right lesser toe(s), initial encounter for closed fracture: Secondary | ICD-10-CM | POA: Diagnosis not present

## 2020-05-12 MED ORDER — AMLODIPINE BESYLATE 2.5 MG PO TABS
2.5000 mg | ORAL_TABLET | Freq: Every day | ORAL | 1 refills | Status: DC
Start: 2020-05-12 — End: 2020-05-13

## 2020-05-12 MED ORDER — ROSUVASTATIN CALCIUM 5 MG PO TABS
5.0000 mg | ORAL_TABLET | ORAL | 1 refills | Status: DC
Start: 2020-05-13 — End: 2020-05-13

## 2020-05-12 MED ORDER — ROSUVASTATIN CALCIUM 5 MG PO TABS
5.0000 mg | ORAL_TABLET | Freq: Every day | ORAL | 1 refills | Status: DC
Start: 2020-05-12 — End: 2020-05-12

## 2020-05-12 NOTE — Telephone Encounter (Signed)
Patient takes her cholesterol medication MWF. No notes instructing to increase. I [facility] corrected [facility]

## 2020-05-12 NOTE — Telephone Encounter (Signed)
[facility] called about refilling the following medications; patient needs a refill on her rosuvastatin (CRESTOR) 5 MG tablet. 90 day supply, and amLODipine (NORVASC) 2.5 MG tablet, 90 day supply Please sent to [place].

## 2020-05-12 NOTE — Telephone Encounter (Signed)
[facility] need some clarification  for prescription for rosuvastatin (CRESTOR) 5 MG tablet please call [phone] [facility]

## 2020-05-13 ENCOUNTER — Other Ambulatory Visit: Payer: Self-pay

## 2020-05-13 MED ORDER — ROSUVASTATIN CALCIUM 5 MG PO TABS
5.0000 mg | ORAL_TABLET | ORAL | 1 refills | Status: DC
Start: 2020-05-13 — End: 2021-05-09

## 2020-05-13 MED ORDER — AMLODIPINE BESYLATE 2.5 MG PO TABS
2.5000 mg | ORAL_TABLET | Freq: Every day | ORAL | 1 refills | Status: DC
Start: 2020-05-13 — End: 2021-06-21

## 2020-05-17 ENCOUNTER — Other Ambulatory Visit: Payer: Self-pay

## 2020-05-17 MED ORDER — DULOXETINE HCL 30 MG PO CPEP: 30.0000 mg | ORAL_CAPSULE | Freq: Every day | ORAL | 3 refills | Status: DC

## 2020-05-17 NOTE — Telephone Encounter (Signed)
Rx sent in for cymbalta to [place]

## 2020-05-17 NOTE — Telephone Encounter (Signed)
Pt called Victoria Hanson has not received the Rx for cymbalta 30mg 

## 2020-05-20 ENCOUNTER — Ambulatory Visit: Payer: Medicare Other | Admitting: Podiatry

## 2020-05-26 ENCOUNTER — Telehealth (INDEPENDENT_AMBULATORY_CARE_PROVIDER_SITE_OTHER): Payer: Medicare Other | Admitting: Internal Medicine

## 2020-05-26 DIAGNOSIS — E7800 Pure hypercholesterolemia, unspecified: Secondary | ICD-10-CM

## 2020-05-26 DIAGNOSIS — I10 Essential (primary) hypertension: Secondary | ICD-10-CM

## 2020-05-26 DIAGNOSIS — F419 Anxiety disorder, unspecified: Secondary | ICD-10-CM

## 2020-05-26 DIAGNOSIS — R739 Hyperglycemia, unspecified: Secondary | ICD-10-CM

## 2020-05-26 DIAGNOSIS — Z85038 Personal history of other malignant neoplasm of large intestine: Secondary | ICD-10-CM

## 2020-05-26 DIAGNOSIS — Z8589 Personal history of malignant neoplasm of other organs and systems: Secondary | ICD-10-CM

## 2020-05-26 DIAGNOSIS — J449 Chronic obstructive pulmonary disease, unspecified: Secondary | ICD-10-CM | POA: Diagnosis not present

## 2020-05-26 DIAGNOSIS — M79671 Pain in right foot: Secondary | ICD-10-CM

## 2020-05-26 DIAGNOSIS — F339 Major depressive disorder, recurrent, unspecified: Secondary | ICD-10-CM

## 2020-05-26 DIAGNOSIS — D649 Anemia, unspecified: Secondary | ICD-10-CM

## 2020-05-26 DIAGNOSIS — M79672 Pain in left foot: Secondary | ICD-10-CM

## 2020-05-26 DIAGNOSIS — I6523 Occlusion and stenosis of bilateral carotid arteries: Secondary | ICD-10-CM

## 2020-05-26 DIAGNOSIS — N183 Chronic kidney disease, stage 3 unspecified: Secondary | ICD-10-CM

## 2020-05-26 DIAGNOSIS — G20 Parkinson's disease: Secondary | ICD-10-CM

## 2020-05-26 DIAGNOSIS — I773 Arterial fibromuscular dysplasia: Secondary | ICD-10-CM

## 2020-05-26 DIAGNOSIS — K219 Gastro-esophageal reflux disease without esophagitis: Secondary | ICD-10-CM

## 2020-05-26 DIAGNOSIS — F329 Major depressive disorder, single episode, unspecified: Secondary | ICD-10-CM

## 2020-05-26 NOTE — Progress Notes (Signed)
Patient ID: Victoria Hanson, female   DOB: 04/09/1951, 69 y.o.   MRN: [id number]   Virtual Visit via telephone Note  This visit type was conducted due to national recommendations for restrictions regarding the COVID-19 pandemic (e.g. social distancing).  This format is felt to be most appropriate for this patient at this time.  All issues noted in this document were discussed and addressed.  No physical exam was performed (except for noted visual exam findings with Video Visits).   I connected with Victoria Hanson by telephone and verified that I am speaking with the correct person using two identifiers. Location patient: home Location provider: work  Persons participating in the telephone visit: patient, provider  The limitations, risks, security and privacy concerns of performing an evaluation and management service by telephone and the availability of in person appointments have been discussed.  It has also been discussed with the patient that there may be a patient responsible charge related to this service. The patient expressed understanding and agreed to proceed.   Reason for visit: follow up appt  HPI: Follow up regarding her blood pressure, cholesterol and anxiety.  Recently suffered right fifth toe fracture.  Initially was seen through [facility] and is being followed by Dr Amalia Hailey (podiatry).  Also having pain - knot on back of foot.  Has f/u planned next week.  Tries to stay active.  No chest pain or sob reported.  No increased cough or congestion reported.  Being followed by neurology for Parkinsons.  Was given trial of amantidine.  Did not tolerate.  No constipation now.  No abdominal pain.  Doing better.  On cymbalta $RemoveBef'30mg'xmXbwRpGdx$  q day.  Feels she is doing well on this medication.  Still taking xanax.  Discussed desire to limit use.     ROS: See pertinent positives and negatives per HPI.  Past Medical History:  Diagnosis Date  . Anxiety   . Asthma   . Atypical chest pain    a. 2003 Cath:  reportedly nl per pt [name]);  b. 04/2015 [place] Admission, neg troponin.  . Colon cancer ([place]) 1999   a. 1999: Pt had partial colectomy. Did develop lung metastasis. Had right upper lobectomy in 01/2007 then had associated chemotherapy.  Marland Kitchen COPD (chronic obstructive pulmonary disease) ([place])   . DDD (degenerative disc disease), lumbosacral   . Depression   . Echocardiogram abnormal    a. 02/2010 Echo: EF 55-60%, mild LAE, no regional wall motion abnormalities  . Elevated transaminase level    a. ? NASH;  b. 06/2015 - nl LFTs.  . GERD (gastroesophageal reflux disease)   . Hematuria   . Holter monitor, abnormal    a. 12/2009 Holter monitoring: NSR, rare PACs and PVCs.  Marland Kitchen Hypercholesterolemia   . Hyperlipidemia   . Hypertension   . Leaky heart valve    a. Per pt report - no evidence of valvular abnormalities on prior echoes.  . Lesion of skin of Right breast 10/20/2017  . Lung metastases ([place]) 2008   a. S/P resection (lobectomy - right)  . Morbid obesity ([place])   . Nephrolithiasis   . Parkinson disease ([place]) 08/2016   [name]    Past Surgical History:  Procedure Laterality Date  . BREAST EXCISIONAL BIOPSY Right 10/07/2009   benign  . CARDIAC CATHETERIZATION  2003   negative as per pt report  . CHOLECYSTECTOMY  2000  . COLON SURGERY     Colon cancer, removed part of large colon  . COLONOSCOPY N/A  04/25/2015   Procedure: COLONOSCOPY;  Surgeon: Manya Silvas, MD;  Location[facility] ENDOSCOPY;  Service: Endoscopy;  Laterality: N/A;  . COLONOSCOPY WITH PROPOFOL N/A 06/14/2017   Procedure: COLONOSCOPY WITH PROPOFOL;  Surgeon: Manya Silvas, MD;  Location: [facility] ENDOSCOPY;  Service: Endoscopy;  Laterality: N/A;  . COLONOSCOPY WITH PROPOFOL N/A 11/17/2019   Procedure: COLONOSCOPY WITH PROPOFOL;  Surgeon: [name], MD;  Location: [facility] ENDOSCOPY;  Service: Gastroenterology;  Laterality: N/A;  . LOBECTOMY  01/2007   Right lung, upper lobe right side removed  . TOTAL ABDOMINAL HYSTERECTOMY      abnormal bleeding  . TUBAL LIGATION  1980    Family History  Problem Relation Age of Onset  . Coronary artery disease Mother        died @ 29 of MI.  . Mental illness Mother   . Diabetes Mother   . Heart attack Father 30       MI  . Cancer Father        lung - died in his mid-70's  . Hypertension Father   . Diabetes Father   . Sudden death Brother   . Mental illness Brother   . Heart attack Brother        MI @ 26, died @ age 36.  . Breast cancer Neg Hx     SOCIAL HX: reviewed.    Current Outpatient Medications:  .  pramipexole (MIRAPEX) 0.25 MG tablet, Take by mouth., Disp: , Rfl:  .  ALPRAZolam (XANAX) 0.5 MG tablet, Take 1/2 tablet q day prn, Disp: 30 tablet, Rfl: 0 .  amLODipine (NORVASC) 2.5 MG tablet, Take 1 tablet (2.5 mg total) by mouth daily., Disp: 90 tablet, Rfl: 1 .  aspirin EC 81 MG tablet, Take 81 mg by mouth as needed. , Disp: , Rfl:  .  calcium-vitamin D (OSCAL WITH D) 500-200 MG-UNIT tablet, Take 1 tablet by mouth., Disp: , Rfl:  .  carbidopa-levodopa (SINEMET) 25-100 MG tablet, Take 1.5 in am and one at 11:00 and one in afternoon and 1.5 at night., Disp: 150 tablet, Rfl: 0 .  DULoxetine (CYMBALTA) 30 MG capsule, Take 1 capsule (30 mg total) by mouth daily., Disp: 30 capsule, Rfl: 3 .  ferrous sulfate 325 (65 FE) MG tablet, Take 325 mg by mouth daily with breakfast. , Disp: , Rfl:  .  fluticasone (FLONASE) 50 MCG/ACT nasal spray, Place 2 sprays into both nostrils daily., Disp: 16 g, Rfl: 6 .  fluticasone-salmeterol (ADVAIR HFA) 115-21 MCG/ACT inhaler, Inhale 2 puffs into the lungs 2 (two) times daily., Disp: 1 Inhaler, Rfl: 4 .  meloxicam (MOBIC) 15 MG tablet, Take 1 tablet (15 mg total) by mouth daily., Disp: 30 tablet, Rfl: 1 .  methylPREDNISolone (MEDROL DOSEPAK) 4 MG TBPK tablet, 6 day dose pack - take as directed, Disp: 21 tablet, Rfl: 0 .  montelukast (SINGULAIR) 10 MG tablet, Take by mouth., Disp: , Rfl:  .  mupirocin ointment (BACTROBAN) 2 %, Apply  1 application topically 3 (three) times daily., Disp: 30 g, Rfl: 0 .  omeprazole (PRILOSEC) 20 MG capsule, Take 1 capsule (20 mg total) by mouth 2 (two) times daily before a meal., Disp: 180 capsule, Rfl: 1 .  ondansetron (ZOFRAN ODT) 4 MG disintegrating tablet, Allow 1-2 tablets to dissolve in your mouth every 8 hours as needed for nausea/vomiting, Disp: 30 tablet, Rfl: 0 .  PROAIR HFA 108 (90 Base) MCG/ACT inhaler, INHALE 2 PUFFS BY MOUTH INTO THE LUNGS EVERY 6 HOURS  AS NEEDED FOR WHEEZING, Disp: 8.5 g, Rfl: 2 .  rosuvastatin (CRESTOR) 5 MG tablet, Take 1 tablet (5 mg total) by mouth every Monday, Wednesday, and Friday., Disp: 90 tablet, Rfl: 1 .  Spacer/Aero-Holding Chambers (EASIVENT) inhaler, See admin instructions., Disp: , Rfl:  .  traZODone (DESYREL) 50 MG tablet, TAKE 2 TABLETS BY MOUTH AT BEDTIME, Disp: 180 tablet, Rfl: 1 .  vitamin B-12 (CYANOCOBALAMIN) 100 MCG tablet, Take 100 mcg by mouth daily., Disp: , Rfl:   EXAM:  GENERAL: alert. Sounds to be in no acute distress.  Answering questions appropriately.    PSYCH/NEURO: pleasant and cooperative, no obvious depression or anxiety, speech and thought processing grossly intact  ASSESSMENT AND PLAN:  Discussed the following assessment and plan:  Problem List Items Addressed This Visit    Anxiety, mild    On cymbalta.  Stable.  Follow.       Depression, recurrent ([place])    On cymbalta.  Stable.  Follow.        History of colon cancer    Followed by GI.  Last colonoscopy 11/2019.        Hypertension    Blood pressure has been doing ok.  Continue on amlodipine.  Follow pressures.  Follow metabolic panel.       CKD (chronic kidney disease), stage III ([place])    Stay hydrated.  Continue to avoid antiinflammatories.  Follow metabolic panel.        GERD (gastroesophageal reflux disease)    On prilosec.  No upper symptoms reported.        Hyperglycemia    Low carb diet and exercise.  Follow met b and a1c.       History of  malignant neoplasm metastatic to lung    S/p lobectomy.  Followed by pulmonary.  Breathing stable.        Major depressive disorder with single episode    On cymbalta.  Feels she is doing well on this medication.  Follow.        Pure hypercholesterolemia    On crestor.  Low cholesterol diet and exercise.  Follow lipid panel and liver function tests.        Foot pain, bilateral    Knot on back of foot and toe fracture.  Some increased pain.  Has f/u planned with podiatry.        Carotid stenosis    Saw [facility] - <30% bilateral.  Recommended f/u prn.        Parkinson's disease ([place])    Followed by neurology.  On sinemet/mirapex.  Reports is stable.        Chronic obstructive pulmonary disease ([place])    Continue advair.  Breathing stable.  Appears to be doing better.  Follow.        Fibromuscular dysplasia (HCC)    Continue risk factor modification.  Has been evaluated by [facility].       Anemia    Follow cbc.           I discussed the assessment and treatment plan with the patient. The patient was provided an opportunity to ask questions and all were answered. The patient agreed with the plan and demonstrated an understanding of the instructions.   The patient was advised to call back or seek an in-person evaluation if the symptoms worsen or if the condition fails to improve as anticipated.  I provided 25 minutes of non-face-to-face time during this encounter.   Einar Pheasant, MD

## 2020-05-31 ENCOUNTER — Other Ambulatory Visit: Payer: Self-pay

## 2020-05-31 ENCOUNTER — Ambulatory Visit (INDEPENDENT_AMBULATORY_CARE_PROVIDER_SITE_OTHER): Payer: Medicare Other

## 2020-05-31 ENCOUNTER — Ambulatory Visit: Payer: Medicare Other | Admitting: Podiatry

## 2020-05-31 DIAGNOSIS — M7661 Achilles tendinitis, right leg: Secondary | ICD-10-CM

## 2020-05-31 MED ORDER — MELOXICAM 15 MG PO TABS: 15.0000 mg | ORAL_TABLET | Freq: Every day | ORAL | 1 refills | Status: DC

## 2020-05-31 MED ORDER — BETAMETHASONE SOD PHOS & ACET 6 (3-3) MG/ML IJ SUSP
3.0000 mg | Freq: Once | INTRAMUSCULAR | Status: AC
  Administered 2020-05-31: 15:00:00 3 mg via INTRAMUSCULAR

## 2020-05-31 MED ORDER — METHYLPREDNISOLONE 4 MG PO TBPK: ORAL_TABLET | ORAL | 0 refills | Status: DC

## 2020-05-31 NOTE — Progress Notes (Signed)
HPI: 69 y.o. female presenting today for evaluation of pain and tenderness to the posterior heel right lower extremity. Patient has not done anything for treatment recently.  She also states that she injured her right fifth toe about one month ago and went to the urgent care. Diagnosed with fifth toe fracture right.  She presents for further treatment and evaluation.     Past Medical History:  Diagnosis Date  . Anxiety   . Asthma   . Atypical chest pain    a. 2003 Cath: reportedly nl per pt [name]);  b. 04/2015 [place] Admission, neg troponin.  . Colon cancer ([place]) 1999   a. 1999: Pt had partial colectomy. Did develop lung metastasis. Had right upper lobectomy in 01/2007 then had associated chemotherapy.  Marland Kitchen COPD (chronic obstructive pulmonary disease) ([place])   . DDD (degenerative disc disease), lumbosacral   . Depression   . Echocardiogram abnormal    a. 02/2010 Echo: EF 55-60%, mild LAE, no regional wall motion abnormalities  . Elevated transaminase level    a. ? NASH;  b. 06/2015 - nl LFTs.  . GERD (gastroesophageal reflux disease)   . Hematuria   . Holter monitor, abnormal    a. 12/2009 Holter monitoring: NSR, rare PACs and PVCs.  Marland Kitchen Hypercholesterolemia   . Hyperlipidemia   . Hypertension   . Leaky heart valve    a. Per pt report - no evidence of valvular abnormalities on prior echoes.  . Lesion of skin of Right breast 10/20/2017  . Lung metastases ([place]) 2008   a. S/P resection (lobectomy - right)  . Morbid obesity ([place])   . Nephrolithiasis   . Parkinson disease ([place]) 08/2016   [name]      Physical Exam: General: The patient is alert and oriented x3 in no acute distress.  Dermatology: Skin is warm, dry and supple bilateral lower extremities. Negative for open lesions or macerations.  Vascular: Palpable pedal pulses bilaterally. No edema or erythema noted. Capillary refill within normal limits.  Neurological: Epicritic and protective threshold grossly intact  bilaterally.   Musculoskeletal Exam: Pain on palpation noted to the posterior tubercle of the right calcaneus at the insertion of the Achilles tendon consistent with retrocalcaneal bursitis. Range of motion within normal limits. Muscle strength 5/5 in all muscle groups bilateral lower extremities.  Radiographic Exam:  Posterior calcaneal spur noted to the respective calcaneus on lateral view. No fracture or dislocation noted. Normal osseous mineralization noted.   Intraarticular comminuted fracture noted to the fifth toe. Clinically the toe is in decent alignment.   Assessment: 1. Insertional Achilles tendinitis right  2. Retrocalcaneal bursitis 3. Fracture fifth toe right  Plan of Care:  1. Patient was evaluated. Radiographs were reviewed today. 2. Injection of 0.5 mL Celestone Soluspan injected into the retrocalcaneal bursa. Care was taken to avoid direct injection into the Achilles tendon. 3. Rx medrol dosepak 4. Rx meloxicam 15mg  daily to take after completion of the dosepak 5. RTC 4 weeks   Edrick Kins, DPM [facility]  Dr. Edrick Kins, [address]                                        [place], [place] [address]                Office [phone]  Fax [phone]

## 2020-06-06 ENCOUNTER — Encounter: Payer: Self-pay | Admitting: Internal Medicine

## 2020-06-06 NOTE — Assessment & Plan Note (Signed)
On cymbalta.  Feels she is doing well on this medication.  Follow.

## 2020-06-06 NOTE — Assessment & Plan Note (Signed)
Follow cbc.  

## 2020-06-06 NOTE — Assessment & Plan Note (Signed)
S/p lobectomy.  Followed by pulmonary.  Breathing stable.

## 2020-06-06 NOTE — Assessment & Plan Note (Signed)
On cymbalta.  Stable.  Follow.

## 2020-06-06 NOTE — Assessment & Plan Note (Signed)
Low carb diet and exercise.  Follow met b and a1c.  

## 2020-06-06 NOTE — Assessment & Plan Note (Signed)
On prilosec.  No upper symptoms reported.

## 2020-06-06 NOTE — Assessment & Plan Note (Signed)
Stay hydrated.  Continue to avoid antiinflammatories.  Follow metabolic panel.

## 2020-06-06 NOTE — Assessment & Plan Note (Signed)
Continue advair.  Breathing stable.  Appears to be doing better.  Follow.

## 2020-06-06 NOTE — Assessment & Plan Note (Signed)
Followed by neurology.  On sinemet/mirapex.  Reports is stable.

## 2020-06-06 NOTE — Assessment & Plan Note (Signed)
Knot on back of foot and toe fracture.  Some increased pain.  Has f/u planned with podiatry.

## 2020-06-06 NOTE — Assessment & Plan Note (Signed)
On crestor.  Low cholesterol diet and exercise.  Follow lipid panel and liver function tests.   

## 2020-06-06 NOTE — Assessment & Plan Note (Signed)
Followed by GI.  Last colonoscopy 11/2019.

## 2020-06-06 NOTE — Assessment & Plan Note (Signed)
Blood pressure has been doing ok.  Continue on amlodipine.  Follow pressures.  Follow metabolic panel.

## 2020-06-06 NOTE — Assessment & Plan Note (Signed)
Continue risk factor modification.  Has been evaluated by [facility].

## 2020-06-06 NOTE — Assessment & Plan Note (Signed)
Saw [facility] - <30% bilateral.  Recommended f/u prn.

## 2020-06-21 DIAGNOSIS — M25562 Pain in left knee: Secondary | ICD-10-CM | POA: Diagnosis not present

## 2020-06-21 DIAGNOSIS — M1712 Unilateral primary osteoarthritis, left knee: Secondary | ICD-10-CM | POA: Diagnosis not present

## 2020-06-24 ENCOUNTER — Telehealth (INDEPENDENT_AMBULATORY_CARE_PROVIDER_SITE_OTHER): Payer: Medicare Other | Admitting: Internal Medicine

## 2020-06-24 DIAGNOSIS — D649 Anemia, unspecified: Secondary | ICD-10-CM | POA: Diagnosis not present

## 2020-06-24 DIAGNOSIS — I10 Essential (primary) hypertension: Secondary | ICD-10-CM | POA: Diagnosis not present

## 2020-06-24 DIAGNOSIS — E7800 Pure hypercholesterolemia, unspecified: Secondary | ICD-10-CM

## 2020-06-24 DIAGNOSIS — N183 Chronic kidney disease, stage 3 unspecified: Secondary | ICD-10-CM

## 2020-06-24 DIAGNOSIS — R739 Hyperglycemia, unspecified: Secondary | ICD-10-CM

## 2020-06-24 DIAGNOSIS — F339 Major depressive disorder, recurrent, unspecified: Secondary | ICD-10-CM

## 2020-06-24 DIAGNOSIS — J449 Chronic obstructive pulmonary disease, unspecified: Secondary | ICD-10-CM

## 2020-06-24 DIAGNOSIS — G20 Parkinson's disease: Secondary | ICD-10-CM

## 2020-06-24 NOTE — Progress Notes (Signed)
Patient ID: Victoria Hanson, female   DOB: 08/17/1950, 70 y.o.   MRN: [id number]   Virtual Visit via telephone Note  This visit type was conducted due to national recommendations for restrictions regarding the COVID-19 pandemic (e.g. social distancing).  This format is felt to be most appropriate for this patient at this time.  All issues noted in this document were discussed and addressed.  No physical exam was performed (except for noted visual exam findings with Video Visits).   I connected with [name] by telephone and verified that I am speaking with the correct person using two identifiers. Location patient: home Location provider: work  Persons participating in the telephone visit: patient, provider  The limitations, risks, security and privacy concerns of performing an evaluation and management service by telephone and the availability of in person appointments have been discussed.  It has also been discussed with the patient that there may be a patient responsible charge related to this service. The patient expressed understanding and agreed to proceed.   Reason for visit: scheduled follow up.    HPI: Last visit she was started on cymbalta.  Has a history of increased anxiety/stress.  Had been on lexapro.  Doing well on the cymbalta.  Feels better.  Has been having pain in her fot and knee.  Saw Vance Peper for her left knee.  S/p injection.  Feels did not help.  Discussed f/u with ortho.  Also seeing podiatry for right foot pain - achilles tendinitis.  Taking mobic.  Discussed mobic with her CKD.  She is using a walker at times.  Has f/u planned with podiatry.  No chest pain.  Breathing stable.  Previous side pain resolved.  Overall feels better.     ROS: See pertinent positives and negatives per HPI.  Past Medical History:  Diagnosis Date  . Anxiety   . Asthma   . Atypical chest pain    a. 2003 Cath: reportedly nl per pt [name]);  b. 04/2015 [place] Admission, neg troponin.   . Colon cancer ([place]) 1999   a. 1999: Pt had partial colectomy. Did develop lung metastasis. Had right upper lobectomy in 01/2007 then had associated chemotherapy.  Marland Kitchen COPD (chronic obstructive pulmonary disease) ([place])   . DDD (degenerative disc disease), lumbosacral   . Depression   . Echocardiogram abnormal    a. 02/2010 Echo: EF 55-60%, mild LAE, no regional wall motion abnormalities  . Elevated transaminase level    a. ? NASH;  b. 06/2015 - nl LFTs.  . GERD (gastroesophageal reflux disease)   . Hematuria   . Holter monitor, abnormal    a. 12/2009 Holter monitoring: NSR, rare PACs and PVCs.  Marland Kitchen Hypercholesterolemia   . Hyperlipidemia   . Hypertension   . Leaky heart valve    a. Per pt report - no evidence of valvular abnormalities on prior echoes.  . Lesion of skin of Right breast 10/20/2017  . Lung metastases ([place]) 2008   a. S/P resection (lobectomy - right)  . Morbid obesity ([place])   . Nephrolithiasis   . Parkinson disease ([place]) 08/2016   [name]    Past Surgical History:  Procedure Laterality Date  . BREAST EXCISIONAL BIOPSY Right 10/07/2009   benign  . CARDIAC CATHETERIZATION  2003   negative as per pt report  . CHOLECYSTECTOMY  2000  . COLON SURGERY     Colon cancer, removed part of large colon  . COLONOSCOPY N/A 04/25/2015   Procedure: COLONOSCOPY;  Surgeon:  Manya Silvas, MD;  Location: [facility] ENDOSCOPY;  Service: Endoscopy;  Laterality: N/A;  . COLONOSCOPY WITH PROPOFOL N/A 06/14/2017   Procedure: COLONOSCOPY WITH PROPOFOL;  Surgeon: Manya Silvas, MD;  Location: [facility] ENDOSCOPY;  Service: Endoscopy;  Laterality: N/A;  . COLONOSCOPY WITH PROPOFOL N/A 11/17/2019   Procedure: COLONOSCOPY WITH PROPOFOL;  Surgeon: [name], MD;  Location: [facility] ENDOSCOPY;  Service: Gastroenterology;  Laterality: N/A;  . LOBECTOMY  01/2007   Right lung, upper lobe right side removed  . TOTAL ABDOMINAL HYSTERECTOMY     abnormal bleeding  . TUBAL LIGATION  1980    Family History   Problem Relation Age of Onset  . Coronary artery disease Mother        died @ 35 of MI.  . Mental illness Mother   . Diabetes Mother   . Heart attack Father 39       MI  . Cancer Father        lung - died in his mid-70's  . Hypertension Father   . Diabetes Father   . Sudden death Brother   . Mental illness Brother   . Heart attack Brother        MI @ 51, died @ age 22.  . Breast cancer Neg Hx     SOCIAL HX: reviewed.    Current Outpatient Medications:  .  ALPRAZolam (XANAX) 0.5 MG tablet, Take 1/2 tablet q day prn, Disp: 30 tablet, Rfl: 0 .  amLODipine (NORVASC) 2.5 MG tablet, Take 1 tablet (2.5 mg total) by mouth daily., Disp: 90 tablet, Rfl: 1 .  aspirin EC 81 MG tablet, Take 81 mg by mouth as needed. , Disp: , Rfl:  .  calcium-vitamin D (OSCAL WITH D) 500-200 MG-UNIT tablet, Take 1 tablet by mouth., Disp: , Rfl:  .  carbidopa-levodopa (SINEMET) 25-100 MG tablet, Take 1.5 in am and one at 11:00 and one in afternoon and 1.5 at night., Disp: 150 tablet, Rfl: 0 .  DULoxetine (CYMBALTA) 30 MG capsule, Take 1 capsule (30 mg total) by mouth daily., Disp: 30 capsule, Rfl: 3 .  ferrous sulfate 325 (65 FE) MG tablet, Take 325 mg by mouth daily with breakfast. , Disp: , Rfl:  .  fluticasone (FLONASE) 50 MCG/ACT nasal spray, Place 2 sprays into both nostrils daily., Disp: 16 g, Rfl: 6 .  fluticasone-salmeterol (ADVAIR HFA) 115-21 MCG/ACT inhaler, Inhale 2 puffs into the lungs 2 (two) times daily., Disp: 1 Inhaler, Rfl: 4 .  meloxicam (MOBIC) 15 MG tablet, Take 1 tablet (15 mg total) by mouth daily., Disp: 30 tablet, Rfl: 1 .  montelukast (SINGULAIR) 10 MG tablet, Take by mouth., Disp: , Rfl:  .  mupirocin ointment (BACTROBAN) 2 %, Apply 1 application topically 3 (three) times daily., Disp: 30 g, Rfl: 0 .  omeprazole (PRILOSEC) 20 MG capsule, Take 1 capsule (20 mg total) by mouth 2 (two) times daily before a meal., Disp: 180 capsule, Rfl: 1 .  ondansetron (ZOFRAN ODT) 4 MG disintegrating  tablet, Allow 1-2 tablets to dissolve in your mouth every 8 hours as needed for nausea/vomiting, Disp: 30 tablet, Rfl: 0 .  pramipexole (MIRAPEX) 0.25 MG tablet, Take by mouth., Disp: , Rfl:  .  PROAIR HFA 108 (90 Base) MCG/ACT inhaler, INHALE 2 PUFFS BY MOUTH INTO THE LUNGS EVERY 6 HOURS AS NEEDED FOR WHEEZING, Disp: 8.5 g, Rfl: 2 .  rosuvastatin (CRESTOR) 5 MG tablet, Take 1 tablet (5 mg total) by mouth every Monday, Wednesday, and Friday.,  Disp: 90 tablet, Rfl: 1 .  Spacer/Aero-Holding Chambers (EASIVENT) inhaler, See admin instructions., Disp: , Rfl:  .  traZODone (DESYREL) 50 MG tablet, TAKE 2 TABLETS BY MOUTH AT BEDTIME, Disp: 180 tablet, Rfl: 1 .  vitamin B-12 (CYANOCOBALAMIN) 100 MCG tablet, Take 100 mcg by mouth daily., Disp: , Rfl:   EXAM:  GENERAL: alert. Sounds to be in no acute distress.  Answering questions appropriately.    PSYCH/NEURO: pleasant and cooperative, no obvious depression or anxiety, speech and thought processing grossly intact  ASSESSMENT AND PLAN:  Discussed the following assessment and plan:  Problem List Items Addressed This Visit    Anemia    Follow cbc.       Relevant Orders   CBC with Differential/Platelet   IBC + Ferritin   Chronic obstructive pulmonary disease ([place])    Continue advair.  Breathing stable.        CKD (chronic kidney disease), stage III ([place])    Discussed mobic.  Try to minimize amount of antiinflammatories.  Stay hydrated.  Follow metabolic panel.        Depression, recurrent ([place])    Doing well on cymbalta.  Feels better.  Follow.        Hyperglycemia    Low carb diet and exercise.  Follow met b and a1c.       Relevant Orders   Hemoglobin A1c   Hypertension    Blood pressure has been doing well.  Continue amlodipine.  Follow pressures.  Follow metabolic panel.       Relevant Orders   Basic metabolic panel   Parkinson's disease ([place])    Followed by neurology.  On sinemet and mirapex.  Follow.       Pure  hypercholesterolemia    Crestor.  Low cholesterol diet and exercise.  Follow lipid panel and liver function tests.        Relevant Orders   Hepatic function panel   Lipid panel       I discussed the assessment and treatment plan with the patient. The patient was provided an opportunity to ask questions and all were answered. The patient agreed with the plan and demonstrated an understanding of the instructions.   The patient was advised to call back or seek an in-person evaluation if the symptoms worsen or if the condition fails to improve as anticipated.  I provided 23 minutes of non-face-to-face time during this encounter.   Einar [place]he[place], MD

## 2020-06-25 ENCOUNTER — Encounter: Payer: Self-pay | Admitting: Internal Medicine

## 2020-06-25 NOTE — Assessment & Plan Note (Signed)
Discussed mobic.  Try to minimize amount of antiinflammatories.  Stay hydrated.  Follow metabolic panel.

## 2020-06-25 NOTE — Assessment & Plan Note (Signed)
Blood pressure has been doing well.  Continue amlodipine.  Follow pressures.  Follow metabolic panel.

## 2020-06-25 NOTE — Assessment & Plan Note (Signed)
Doing well on cymbalta.  Feels better.  Follow.

## 2020-06-25 NOTE — Assessment & Plan Note (Signed)
Crestor.  Low cholesterol diet and exercise.  Follow lipid panel and liver function tests.

## 2020-06-25 NOTE — Assessment & Plan Note (Signed)
Continue advair.  Breathing stable.

## 2020-06-25 NOTE — Assessment & Plan Note (Signed)
Followed by neurology.  On sinemet and mirapex.  Follow.

## 2020-06-25 NOTE — Assessment & Plan Note (Signed)
Low carb diet and exercise.  Follow met b and a1c.  

## 2020-06-25 NOTE — Assessment & Plan Note (Signed)
Follow cbc.  

## 2020-07-01 ENCOUNTER — Ambulatory Visit: Payer: Medicare Other | Admitting: Podiatry

## 2020-07-05 ENCOUNTER — Ambulatory Visit: Payer: Medicare Other | Admitting: Podiatry

## 2020-07-05 ENCOUNTER — Other Ambulatory Visit: Payer: Self-pay

## 2020-07-05 DIAGNOSIS — M7661 Achilles tendinitis, right leg: Secondary | ICD-10-CM

## 2020-07-05 MED ORDER — BETAMETHASONE SOD PHOS & ACET 6 (3-3) MG/ML IJ SUSP
3.0000 mg | Freq: Once | INTRAMUSCULAR | Status: AC
  Administered 2020-07-05: 3 mg via INTRA_ARTICULAR

## 2020-07-05 MED ORDER — CYCLOBENZAPRINE HCL 10 MG PO TABS: 10.0000 mg | ORAL_TABLET | Freq: Three times a day (TID) | ORAL | 0 refills | Status: DC | PRN

## 2020-07-05 NOTE — Progress Notes (Signed)
   HPI: 70 y.o. female presenting today for evaluation of Achilles tendinitis to the right lower extremity as well as a fracture to the right fifth toe.  Patient states she no longer has any pain or tenderness associated to the right fifth toe.  She experiences cramping with tightness to the right calf muscle and Achilles tendon.  She says that the injections helped temporarily.  She presents for further treatment evaluation    Past Medical History:  Diagnosis Date  . Anxiety   . Asthma   . Atypical chest pain    a. 2003 Cath: reportedly nl per pt [name]);  b. 04/2015 [place] Admission, neg troponin.  . Colon cancer ([place]) 1999   a. 1999: Pt had partial colectomy. Did develop lung metastasis. Had right upper lobectomy in 01/2007 then had associated chemotherapy.  Marland Kitchen COPD (chronic obstructive pulmonary disease) ([place])   . DDD (degenerative disc disease), lumbosacral   . Depression   . Echocardiogram abnormal    a. 02/2010 Echo: EF 55-60%, mild LAE, no regional wall motion abnormalities  . Elevated transaminase level    a. ? NASH;  b. 06/2015 - nl LFTs.  . GERD (gastroesophageal reflux disease)   . Hematuria   . Holter monitor, abnormal    a. 12/2009 Holter monitoring: NSR, rare PACs and PVCs.  Marland Kitchen Hypercholesterolemia   . Hyperlipidemia   . Hypertension   . Leaky heart valve    a. Per pt report - no evidence of valvular abnormalities on prior echoes.  . Lesion of skin of Right breast 10/20/2017  . Lung metastases ([place]) 2008   a. S/P resection (lobectomy - right)  . Morbid obesity ([place])   . Nephrolithiasis   . Parkinson disease ([place]) 08/2016   [name]      Physical Exam: General: The patient is alert and oriented x3 in no acute distress.  Dermatology: Skin is warm, dry and supple bilateral lower extremities. Negative for open lesions or macerations.  Vascular: Palpable pedal pulses bilaterally. No edema or erythema noted. Capillary refill within normal limits.  Neurological:  Epicritic and protective threshold grossly intact bilaterally.   Musculoskeletal Exam: Pain on palpation noted to the posterior tubercle of the right calcaneus at the insertion of the Achilles tendon consistent with retrocalcaneal bursitis. Range of motion within normal limits. Muscle strength 5/5 in all muscle groups bilateral lower extremities.   Assessment: 1. Insertional Achilles tendinitis right  2. Retrocalcaneal bursitis 3. Fracture fifth toe right; resolved  Plan of Care:  1. Patient was evaluated. Radiographs were reviewed today. 2. Injection of 0.5 mL Celestone Soluspan injected into the retrocalcaneal bursa. Care was taken to avoid direct injection into the Achilles tendon. 3.  Prescription for Flexeril 10 mg 3 times daily #90 to see if it alleviates some of her cramping sensations in the posterior leg 4.  Postsurgical shoe dispensed to alleviate pressure from the posterior heel 5.  Return to clinic in 4 weeks   Edrick Kins, DPM [facility]  Dr. Edrick Kins, [address]                                        [place], [place] [address]                Office [phone]  Fax [phone]

## 2020-07-06 ENCOUNTER — Telehealth: Payer: Self-pay | Admitting: Internal Medicine

## 2020-07-06 NOTE — Telephone Encounter (Signed)
Patient called in wanted to know if she could get her potassium check when she comes in for Thursday 1-27

## 2020-07-06 NOTE — Telephone Encounter (Signed)
BMP is ordered

## 2020-07-07 ENCOUNTER — Other Ambulatory Visit (INDEPENDENT_AMBULATORY_CARE_PROVIDER_SITE_OTHER): Payer: Medicare Other

## 2020-07-07 ENCOUNTER — Other Ambulatory Visit: Payer: Self-pay

## 2020-07-07 DIAGNOSIS — D649 Anemia, unspecified: Secondary | ICD-10-CM | POA: Diagnosis not present

## 2020-07-07 DIAGNOSIS — R739 Hyperglycemia, unspecified: Secondary | ICD-10-CM

## 2020-07-07 DIAGNOSIS — E7800 Pure hypercholesterolemia, unspecified: Secondary | ICD-10-CM

## 2020-07-07 DIAGNOSIS — I10 Essential (primary) hypertension: Secondary | ICD-10-CM

## 2020-07-07 LAB — HEMOGLOBIN A1C: Hgb A1c MFr Bld: 6.1 % (ref 4.6–6.5)

## 2020-07-07 LAB — IBC + FERRITIN
Ferritin: 44.1 ng/mL (ref 10.0–291.0)
Iron: 93 ug/dL (ref 42–145)
Saturation Ratios: 29.3 % (ref 20.0–50.0)
Transferrin: 227 mg/dL (ref 212.0–360.0)

## 2020-07-07 LAB — CBC WITH DIFFERENTIAL/PLATELET
Basophils Absolute: 0 10*3/uL (ref 0.0–0.1)
Basophils Relative: 0.4 % (ref 0.0–3.0)
Eosinophils Absolute: 0 10*3/uL (ref 0.0–0.7)
Eosinophils Relative: 0 % (ref 0.0–5.0)
HCT: 36.5 % (ref 36.0–46.0)
Hemoglobin: 12.2 g/dL (ref 12.0–15.0)
Lymphocytes Relative: 11.4 % — ABNORMAL LOW (ref 12.0–46.0)
Lymphs Abs: 1.5 10*3/uL (ref 0.7–4.0)
MCHC: 33.4 g/dL (ref 30.0–36.0)
MCV: 86.9 fl (ref 78.0–100.0)
Monocytes Absolute: 0.5 10*3/uL (ref 0.1–1.0)
Monocytes Relative: 4.3 % (ref 3.0–12.0)
Neutro Abs: 10.8 10*3/uL — ABNORMAL HIGH (ref 1.4–7.7)
Neutrophils Relative %: 83.9 % — ABNORMAL HIGH (ref 43.0–77.0)
Platelets: 317 10*3/uL (ref 150.0–400.0)
RBC: 4.2 Mil/uL (ref 3.87–5.11)
RDW: 14.5 % (ref 11.5–15.5)
WBC: 12.8 10*3/uL — ABNORMAL HIGH (ref 4.0–10.5)

## 2020-07-07 LAB — BASIC METABOLIC PANEL
BUN: 29 mg/dL — ABNORMAL HIGH (ref 6–23)
CO2: 27 mEq/L (ref 19–32)
Calcium: 9.1 mg/dL (ref 8.4–10.5)
Chloride: 104 mEq/L (ref 96–112)
Creatinine, Ser: 1.17 mg/dL (ref 0.40–1.20)
GFR: 47.24 mL/min — ABNORMAL LOW (ref >60.00)
Glucose, Bld: 113 mg/dL — ABNORMAL HIGH (ref 70–99)
Potassium: 4.4 mEq/L (ref 3.5–5.1)
Sodium: 137 mEq/L (ref 135–145)

## 2020-07-07 LAB — HEPATIC FUNCTION PANEL
ALT: 3 U/L (ref 0–35)
AST: 10 U/L (ref 0–37)
Albumin: 3.7 g/dL (ref 3.5–5.2)
Alkaline Phosphatase: 74 U/L (ref 39–117)
Bilirubin, Direct: 0 mg/dL (ref 0.0–0.3)
Total Bilirubin: 0.3 mg/dL (ref 0.2–1.2)
Total Protein: 6.6 g/dL (ref 6.0–8.3)

## 2020-07-07 LAB — LIPID PANEL
Cholesterol: 140 mg/dL (ref 0–200)
HDL: 62.4 mg/dL (ref >39.00)
LDL Cholesterol: 64 mg/dL (ref 0–99)
NonHDL: 77.22
Total CHOL/HDL Ratio: 2
Triglycerides: 68 mg/dL (ref 0.0–149.0)
VLDL: 13.6 mg/dL (ref 0.0–40.0)

## 2020-07-09 ENCOUNTER — Emergency Department
Admission: EM | Admit: 2020-07-09 | Discharge: 2020-07-09 | Disposition: A | Discharge status: Home / Self Care | Payer: Medicare Other | Attending: Emergency Medicine | Admitting: Emergency Medicine

## 2020-07-09 ENCOUNTER — Encounter: Payer: Self-pay | Admitting: Emergency Medicine

## 2020-07-09 ENCOUNTER — Emergency Department: Payer: Medicare Other

## 2020-07-09 ENCOUNTER — Other Ambulatory Visit: Payer: Self-pay

## 2020-07-09 DIAGNOSIS — Z7982 Long term (current) use of aspirin: Secondary | ICD-10-CM | POA: Diagnosis not present

## 2020-07-09 DIAGNOSIS — M79604 Pain in right leg: Secondary | ICD-10-CM

## 2020-07-09 DIAGNOSIS — Z7951 Long term (current) use of inhaled steroids: Secondary | ICD-10-CM | POA: Insufficient documentation

## 2020-07-09 DIAGNOSIS — M25562 Pain in left knee: Secondary | ICD-10-CM | POA: Diagnosis not present

## 2020-07-09 DIAGNOSIS — Z79899 Other long term (current) drug therapy: Secondary | ICD-10-CM | POA: Insufficient documentation

## 2020-07-09 DIAGNOSIS — M79662 Pain in left lower leg: Secondary | ICD-10-CM | POA: Insufficient documentation

## 2020-07-09 DIAGNOSIS — Z85118 Personal history of other malignant neoplasm of bronchus and lung: Secondary | ICD-10-CM | POA: Insufficient documentation

## 2020-07-09 DIAGNOSIS — I129 Hypertensive chronic kidney disease with stage 1 through stage 4 chronic kidney disease, or unspecified chronic kidney disease: Secondary | ICD-10-CM | POA: Insufficient documentation

## 2020-07-09 DIAGNOSIS — J439 Emphysema, unspecified: Secondary | ICD-10-CM | POA: Diagnosis not present

## 2020-07-09 DIAGNOSIS — R0602 Shortness of breath: Secondary | ICD-10-CM | POA: Insufficient documentation

## 2020-07-09 DIAGNOSIS — J449 Chronic obstructive pulmonary disease, unspecified: Secondary | ICD-10-CM | POA: Diagnosis not present

## 2020-07-09 DIAGNOSIS — N183 Chronic kidney disease, stage 3 unspecified: Secondary | ICD-10-CM | POA: Diagnosis not present

## 2020-07-09 DIAGNOSIS — M79605 Pain in left leg: Secondary | ICD-10-CM

## 2020-07-09 DIAGNOSIS — M25571 Pain in right ankle and joints of right foot: Secondary | ICD-10-CM | POA: Diagnosis not present

## 2020-07-09 DIAGNOSIS — Z7722 Contact with and (suspected) exposure to environmental tobacco smoke (acute) (chronic): Secondary | ICD-10-CM | POA: Diagnosis not present

## 2020-07-09 DIAGNOSIS — J45909 Unspecified asthma, uncomplicated: Secondary | ICD-10-CM | POA: Insufficient documentation

## 2020-07-09 DIAGNOSIS — M79661 Pain in right lower leg: Secondary | ICD-10-CM | POA: Diagnosis not present

## 2020-07-09 DIAGNOSIS — M7989 Other specified soft tissue disorders: Secondary | ICD-10-CM | POA: Insufficient documentation

## 2020-07-09 LAB — COMPREHENSIVE METABOLIC PANEL
ALT: <5 U/L (ref 0–44)
AST: 17 U/L (ref 15–41)
Albumin: 3.6 g/dL (ref 3.5–5.0)
Alkaline Phosphatase: 75 U/L (ref 38–126)
Anion gap: 9 (ref 5–15)
BUN: 25 mg/dL — ABNORMAL HIGH (ref 8–23)
CO2: 26 mmol/L (ref 22–32)
Calcium: 9 mg/dL (ref 8.9–10.3)
Chloride: 102 mmol/L (ref 98–111)
Creatinine, Ser: 1.15 mg/dL — ABNORMAL HIGH (ref 0.44–1.00)
GFR, Estimated: 51 mL/min — ABNORMAL LOW (ref >60)
Glucose, Bld: 102 mg/dL — ABNORMAL HIGH (ref 70–99)
Potassium: 4.6 mmol/L (ref 3.5–5.1)
Sodium: 137 mmol/L (ref 135–145)
Total Bilirubin: 0.8 mg/dL (ref 0.3–1.2)
Total Protein: 6.7 g/dL (ref 6.5–8.1)

## 2020-07-09 LAB — CBC WITH DIFFERENTIAL/PLATELET
Abs Immature Granulocytes: 0.06 10*3/uL (ref 0.00–0.07)
Basophils Absolute: 0.1 10*3/uL (ref 0.0–0.1)
Basophils Relative: 0 %
Eosinophils Absolute: 0.1 10*3/uL (ref 0.0–0.5)
Eosinophils Relative: 1 %
HCT: 38.8 % (ref 36.0–46.0)
Hemoglobin: 12.2 g/dL (ref 12.0–15.0)
Immature Granulocytes: 0 %
Lymphocytes Relative: 18 %
Lymphs Abs: 2.4 10*3/uL (ref 0.7–4.0)
MCH: 28.4 pg (ref 26.0–34.0)
MCHC: 31.4 g/dL (ref 30.0–36.0)
MCV: 90.4 fL (ref 80.0–100.0)
Monocytes Absolute: 1 10*3/uL (ref 0.1–1.0)
Monocytes Relative: 8 %
Neutro Abs: 9.8 10*3/uL — ABNORMAL HIGH (ref 1.7–7.7)
Neutrophils Relative %: 73 %
Platelets: 313 10*3/uL (ref 150–400)
RBC: 4.29 MIL/uL (ref 3.87–5.11)
RDW: 13.9 % (ref 11.5–15.5)
WBC: 13.5 10*3/uL — ABNORMAL HIGH (ref 4.0–10.5)
nRBC: 0 % (ref 0.0–0.2)

## 2020-07-09 LAB — TROPONIN I (HIGH SENSITIVITY): Troponin I (High Sensitivity): 7 ng/L (ref <18)

## 2020-07-09 MED ORDER — PREDNISONE 10 MG (21) PO TBPK: ORAL_TABLET | ORAL | 0 refills | Status: DC

## 2020-07-09 MED ORDER — DICLOFENAC SODIUM 1 % EX GEL: 2.0000 g | Freq: Four times a day (QID) | CUTANEOUS | 0 refills | Status: DC

## 2020-07-09 NOTE — ED Notes (Signed)
Pt up to use restroom.

## 2020-07-09 NOTE — ED Provider Notes (Signed)
[facility] Emergency Department Provider Note   ____________________________________________   I have reviewed the triage vital signs and the nursing notes.   HISTORY  Chief Complaint Leg pain  History limited by: Not Limited   HPI Victoria Hanson is a 70 y.o. female who presents to the emergency department today with primary complaint of leg pain. The patient states that the pain has been present for quite a while. She has seen both podiatry and orthopedic for her leg pain. It apparently started in her right ankle due to a bone spur. Because of this she altered her gait and then developed left knee pain. She says the pain in both locations has gotten worse and although she has had injections in both places it has never significantly helped. She says she feels that pain in her right ankle is now going into her right calf. She has secondary complaint of some worsening shortness of breath as well.  Records reviewed. Per medical record review patient has a history of asthma, COPD.   Past Medical History:  Diagnosis Date  . Anxiety   . Asthma   . Atypical chest pain    a. 2003 Cath: reportedly nl per pt [name]);  b. 04/2015 [facility] Admission, neg troponin.  . Colon cancer ([place]) 1999   a. 1999: Pt had partial colectomy. Did develop lung metastasis. Had right upper lobectomy in 01/2007 then had associated chemotherapy.  Marland Kitchen COPD (chronic obstructive pulmonary disease) ([place])   . DDD (degenerative disc disease), lumbosacral   . Depression   . Echocardiogram abnormal    a. 02/2010 Echo: EF 55-60%, mild LAE, no regional wall motion abnormalities  . Elevated transaminase level    a. ? NASH;  b. 06/2015 - nl LFTs.  . GERD (gastroesophageal reflux disease)   . Hematuria   . Holter monitor, abnormal    a. 12/2009 Holter monitoring: NSR, rare PACs and PVCs.  Marland Kitchen Hypercholesterolemia   . Hyperlipidemia   . Hypertension   . Leaky heart valve    a. Per pt report - no  evidence of valvular abnormalities on prior echoes.  . Lesion of skin of Right breast 10/20/2017  . Lung metastases ([place]) 2008   a. S/P resection (lobectomy - right)  . Morbid obesity ([place])   . Nephrolithiasis   . Parkinson disease ([place]) 08/2016   [name]    Patient Active Problem List   Diagnosis Date Noted  . Constipation 11/11/2019  . Anemia 11/11/2019  . Near syncope 02/13/2019  . Fibromuscular dysplasia ([place]) 02/02/2019  . Abdominal bloating 01/04/2019  . Unsteady gait 12/03/2018  . History of UTI 10/08/2018  . Ear fullness, right 10/08/2018  . B12 deficiency 03/11/2018  . Discharge from right nipple 11/10/2017  . Goals of care, counseling/discussion 10/15/2017  . Parkinson's disease ([place]) 07/31/2017  . Chronic obstructive pulmonary disease ([place]) 07/31/2017  . REM behavioral disorder 05/28/2017  . Elevated liver function tests 08/25/2016  . Carotid stenosis 07/19/2016  . Tremor 06/01/2016  . Osteoporosis 04/08/2016  . Foot pain, bilateral 07/24/2015  . Head pain 07/24/2015  . Hyperlipidemia 06/21/2015  . Other fatigue 06/21/2015  . BMI 33.0-33.9,adult 06/21/2015  . Disorder of kidney and ureter 03/28/2015  . Hyperglycemia 03/20/2015  . Swelling of lower extremity 11/20/2014  . Dysuria 10/24/2014  . Health care maintenance 07/17/2014  . Hypokalemia 04/05/2013  . TIA (transient ischemic attack) 02/09/2013  . Transient cerebral ischemia 02/09/2013  . Anxiety, mild 03/13/2012  . Depression, recurrent ([place]) 03/13/2012  .  History of colon cancer 03/13/2012  . Hypertension 03/13/2012  . CKD (chronic kidney disease), stage III ([place]) 03/13/2012  . GERD (gastroesophageal reflux disease) 03/13/2012  . Acid reflux 03/13/2012  . History of malignant neoplasm metastatic to lung 03/13/2012  . Major depressive disorder with single episode 03/13/2012  . Hypercholesterolemia 02/02/2010  . SOB (shortness of breath) 02/02/2010  . Pure hypercholesterolemia 02/02/2010    Past  Surgical History:  Procedure Laterality Date  . BREAST EXCISIONAL BIOPSY Right 10/07/2009   benign  . CARDIAC CATHETERIZATION  2003   negative as per pt report  . CHOLECYSTECTOMY  2000  . COLON SURGERY     Colon cancer, removed part of large colon  . COLONOSCOPY N/A 04/25/2015   Procedure: COLONOSCOPY;  Surgeon: Manya Silvas, MD;  Location: [facility] ENDOSCOPY;  Service: Endoscopy;  Laterality: N/A;  . COLONOSCOPY WITH PROPOFOL N/A 06/14/2017   Procedure: COLONOSCOPY WITH PROPOFOL;  Surgeon: Manya Silvas, MD;  Location: [facility] ENDOSCOPY;  Service: Endoscopy;  Laterality: N/A;  . COLONOSCOPY WITH PROPOFOL N/A 11/17/2019   Procedure: COLONOSCOPY WITH PROPOFOL;  Surgeon: [name], MD;  Location: [facility] ENDOSCOPY;  Service: Gastroenterology;  Laterality: N/A;  . LOBECTOMY  01/2007   Right lung, upper lobe right side removed  . TOTAL ABDOMINAL HYSTERECTOMY     abnormal bleeding  . TUBAL LIGATION  1980    Prior to Admission medications   Medication Sig Start Date End Date Taking? Authorizing Provider  ALPRAZolam Duanne Moron) 0.5 MG tablet Take 1/2 tablet q day prn 03/02/20   Einar Pheasant, MD  amLODipine (NORVASC) 2.5 MG tablet Take 1 tablet (2.5 mg total) by mouth daily. 05/13/20   Einar Pheasant, MD  aspirin EC 81 MG tablet Take 81 mg by mouth as needed.     [provider]  calcium-vitamin D (OSCAL WITH D) 500-200 MG-UNIT tablet Take 1 tablet by mouth.    [provider]  carbidopa-levodopa (SINEMET) 25-100 MG tablet Take 1.5 in am and one at 11:00 and one in afternoon and 1.5 at night. 06/23/19   Einar Pheasant, MD  cyclobenzaprine (FLEXERIL) 10 MG tablet Take 1 tablet (10 mg total) by mouth 3 (three) times daily as needed for muscle spasms. 07/05/20   Edrick Kins, DPM  DULoxetine (CYMBALTA) 30 MG capsule Take 1 capsule (30 mg total) by mouth daily. 05/17/20   Einar Pheasant, MD  ferrous sulfate 325 (65 FE) MG tablet Take 325 mg by mouth daily with breakfast.     [provider]  fluticasone (FLONASE) 50 MCG/ACT nasal spray Place 2 sprays into both nostrils daily. 03/02/20   Einar Pheasant, MD  fluticasone-salmeterol (ADVAIR HFA) 536-64 MCG/ACT inhaler Inhale 2 puffs into the lungs 2 (two) times daily. 10/15/19   Tyler Pita, MD  meloxicam (MOBIC) 15 MG tablet Take 1 tablet (15 mg total) by mouth daily. 05/31/20   Edrick Kins, DPM  montelukast (SINGULAIR) 10 MG tablet Take by mouth. 01/13/20 01/12/21  [provider]  mupirocin ointment (BACTROBAN) 2 % Apply 1 application topically 3 (three) times daily. 01/08/20   McLean-Scocuzza, Nino Glow, MD  omeprazole (PRILOSEC) 20 MG capsule Take 1 capsule (20 mg total) by mouth 2 (two) times daily before a meal. 06/23/19   Einar Pheasant, MD  ondansetron (ZOFRAN ODT) 4 MG disintegrating tablet Allow 1-2 tablets to dissolve in your mouth every 8 hours as needed for nausea/vomiting 04/20/20   Hinda Kehr, MD  pramipexole (MIRAPEX) 0.25 MG tablet Take by mouth.  04/28/20 05/28/20  [provider]  PROAIR HFA 108 (90 Base) MCG/ACT inhaler INHALE 2 PUFFS BY MOUTH INTO THE LUNGS EVERY 6 HOURS AS NEEDED FOR WHEEZING 11/20/19   Einar Pheasant, MD  rosuvastatin (CRESTOR) 5 MG tablet Take 1 tablet (5 mg total) by mouth every Monday, Wednesday, and Friday. 05/13/20   Einar Pheasant, MD  Spacer/Aero-Holding Josiah Lobo (EASIVENT) inhaler See admin instructions. 01/13/20 01/12/21  [provider]  traZODone (DESYREL) 50 MG tablet TAKE 2 TABLETS BY MOUTH AT BEDTIME 04/12/20   Einar Pheasant, MD  vitamin B-12 (CYANOCOBALAMIN) 100 MCG tablet Take 100 mcg by mouth daily.    [provider]    Allergies Macrobid [nitrofurantoin macrocrystal]; Antihistamines, diphenhydramine-type; Aspirin; Ceftin [cefuroxime axetil]; Celecoxib; Cymbalta [duloxetine hcl]; Diphenhydramine; Escitalopram; Lexapro [escitalopram oxalate]; Metronidazole; Nitrofurantoin; Zoloft [sertraline hcl]; and Ciprofloxacin  Family History   Problem Relation Age of Onset  . Coronary artery disease Mother        died @ 41 of MI.  . Mental illness Mother   . Diabetes Mother   . Heart attack Father 71       MI  . Cancer Father        lung - died in his mid-70's  . Hypertension Father   . Diabetes Father   . Sudden death Brother   . Mental illness Brother   . Heart attack Brother        MI @ 78, died @ age 56.  . Breast cancer Neg Hx     Social History Social History   Tobacco Use  . Smoking status: Passive Smoke Exposure - Never Smoker  . Smokeless tobacco: Never Used  . Tobacco comment: husband and son smoked in her home.  Vaping Use  . Vaping Use: Never used  Substance Use Topics  . Alcohol use: Not Currently    Alcohol/week: 0.0 standard drinks  . Drug use: No    Review of Systems Constitutional: No fever/chills Eyes: No visual changes. ENT: No sore throat. Cardiovascular: Denies chest pain. Respiratory: Positive for shortness of breath. Gastrointestinal: No abdominal pain.  No nausea, no vomiting.  No diarrhea.   Genitourinary: Negative for dysuria. Musculoskeletal: Positive for left knee pain, right lower leg pain. Skin: Negative for rash. Neurological: Negative for headaches, focal weakness or numbness.  ____________________________________________   PHYSICAL EXAM:  VITAL SIGNS: ED Triage Vitals  Enc Vitals Group     BP 07/09/20 1645 (!) 168/74     Pulse Rate 07/09/20 1645 77     Resp 07/09/20 1645 16     Temp 07/09/20 1645 98.7 F (37.1 C)     Temp Source 07/09/20 1645 Oral     SpO2 07/09/20 1645 98 %     Weight 07/09/20 1646 204 lb (92.5 kg)     Height 07/09/20 1646 5\' 3"  (1.6 m)     Head Circumference --      Peak Flow --      Pain Score 07/09/20 1646 0   Constitutional: Alert and oriented.  Eyes: Conjunctivae are normal.  ENT      Head: Normocephalic and atraumatic.      Nose: No congestion/rhinnorhea.      Mouth/Throat: Mucous membranes are moist.      Neck: No  stridor. Hematological/Lymphatic/Immunilogical: No cervical lymphadenopathy. Cardiovascular: Normal rate, regular rhythm.  No murmurs, rubs, or gallops.  Respiratory: Normal respiratory effort without tachypnea nor retractions. Breath sounds are clear and equal bilaterally. No wheezes/rales/rhonchi. Gastrointestinal: Soft and non tender. No  rebound. No guarding.  Genitourinary: Deferred Musculoskeletal: Normal range of motion in all extremities. DP 2+ in bilateral legs. No discoloration or skin changes. Small bruise to right shin. 1+ bilateral edema.  Neurologic:  Normal speech and language. No gross focal neurologic deficits are appreciated.  Skin:  Skin is warm, dry and intact. No rash noted. Psychiatric: Mood and affect are normal. Speech and behavior are normal. Patient exhibits appropriate insight and judgment.  ____________________________________________    LABS (pertinent positives/negatives)  Trop hs 7 CBC wbc 13.5, hgb 12.2, plt 313 CMP wnl except glu 102, BUN 25, cr 1.15  ____________________________________________   EKG  I, Nance Pear, attending physician, personally viewed and interpreted this EKG  EKG Time: 1651 Rate: 78 Rhythm: normal sinus rhythm Axis: normal Intervals: qtc 389 QRS: narrow ST changes: no st elevation Impression: normal ekg  ____________________________________________    RADIOLOGY  CXR No acute abnormalities  Lower extremity US No DVT. ____________________________________________   PROCEDURES  Procedures  ____________________________________________   INITIAL IMPRESSION / ASSESSMENT AND PLAN / ED COURSE  Pertinent labs & imaging results that were available during my care of the patient were reviewed by me and considered in my medical decision making (see chart for details).   Patient presented to the emergency department today with primary complaint of bilateral lower extremity pain.  On exam she did have some swelling  although no skin changes.  Good dorsalis pedis pulses bilaterally.  Did obtain ultrasound to evaluate for possible blood clot and this was negative.  Patient has started work-up for the symptoms with both podiatry and orthopedics.  Discussed with patient portance of continued follow-up.  Will give patient prescription for steroids and arthritis cream.  Terms of the patient's shortness of breath and pneumonia on the chest x-ray.  Do wonder if she is suffering from a small amount of asthma exacerbation.  Do think the steroids will help with that.  ____________________________________________   FINAL CLINICAL IMPRESSION(S) / ED DIAGNOSES  Final diagnoses:  Bilateral lower extremity pain     Note: This dictation was prepared with Dragon dictation. Any transcriptional errors that result from this process are unintentional     Nance Pear, MD 07/09/20 2118

## 2020-07-09 NOTE — ED Notes (Signed)
Dr. Goodman at bedside.  

## 2020-07-09 NOTE — ED Triage Notes (Signed)
Pt to ED via POV, Pt states that for the last few days she has been having shortness of breath. Pt has also had "knots" in her legs for several weeks. Pt reports that her doctor has put shots in them but that they are not getting any better. Pt reports that she has hx/o Asthma. Pt denies cough or congestion. Pt states that she has dizziness when standing and leg swelling as well.

## 2020-07-09 NOTE — ED Notes (Signed)
Pt family member out of room with concern for pt's bp and pulse- rechecked pt's bp on other arm and explain to family and pt that her bp could be elevated d/t pain and stress and that her pulse was normal- they verbalize understanding

## 2020-07-09 NOTE — Discharge Instructions (Addendum)
Please seek medical attention for any high fevers, chest pain, shortness of breath, change in behavior, persistent vomiting, bloody stool or any other new or concerning symptoms.  

## 2020-07-19 ENCOUNTER — Other Ambulatory Visit: Payer: Self-pay

## 2020-07-22 ENCOUNTER — Other Ambulatory Visit: Payer: Self-pay

## 2020-07-22 ENCOUNTER — Ambulatory Visit (INDEPENDENT_AMBULATORY_CARE_PROVIDER_SITE_OTHER): Payer: Medicare Other | Admitting: Internal Medicine

## 2020-07-22 ENCOUNTER — Encounter: Payer: Self-pay | Admitting: Internal Medicine

## 2020-07-22 DIAGNOSIS — R7989 Other specified abnormal findings of blood chemistry: Secondary | ICD-10-CM

## 2020-07-22 DIAGNOSIS — G20 Parkinson's disease: Secondary | ICD-10-CM

## 2020-07-22 DIAGNOSIS — I779 Disorder of arteries and arterioles, unspecified: Secondary | ICD-10-CM

## 2020-07-22 DIAGNOSIS — N183 Chronic kidney disease, stage 3 unspecified: Secondary | ICD-10-CM

## 2020-07-22 DIAGNOSIS — K219 Gastro-esophageal reflux disease without esophagitis: Secondary | ICD-10-CM

## 2020-07-22 DIAGNOSIS — G20A1 Parkinson's disease without dyskinesia, without mention of fluctuations: Secondary | ICD-10-CM

## 2020-07-22 DIAGNOSIS — Z8589 Personal history of malignant neoplasm of other organs and systems: Secondary | ICD-10-CM | POA: Diagnosis not present

## 2020-07-22 DIAGNOSIS — M79672 Pain in left foot: Secondary | ICD-10-CM

## 2020-07-22 DIAGNOSIS — R739 Hyperglycemia, unspecified: Secondary | ICD-10-CM

## 2020-07-22 DIAGNOSIS — M79671 Pain in right foot: Secondary | ICD-10-CM

## 2020-07-22 DIAGNOSIS — I773 Arterial fibromuscular dysplasia: Secondary | ICD-10-CM | POA: Diagnosis not present

## 2020-07-22 DIAGNOSIS — E7800 Pure hypercholesterolemia, unspecified: Secondary | ICD-10-CM | POA: Diagnosis not present

## 2020-07-22 DIAGNOSIS — F339 Major depressive disorder, recurrent, unspecified: Secondary | ICD-10-CM

## 2020-07-22 DIAGNOSIS — J449 Chronic obstructive pulmonary disease, unspecified: Secondary | ICD-10-CM

## 2020-07-22 DIAGNOSIS — I10 Essential (primary) hypertension: Secondary | ICD-10-CM | POA: Diagnosis not present

## 2020-07-22 NOTE — Progress Notes (Signed)
Patient ID: Victoria Hanson, female   DOB: 1951/01/02, 70 y.o.   MRN: [id number]   Subjective:    Patient ID: Victoria Hanson, female    DOB: 1950-08-09, 70 y.o.   MRN: [id number]  HPI This visit occurred during the SARS-CoV-2 public health emergency.  Safety protocols were in place, including screening questions prior to the visit, additional usage of staff PPE, and extensive cleaning of exam room while observing appropriate contact time as indicated for disinfecting solutions.  Patient here for a scheduled follow up. Was seen in ER 07/09/20 for leg pain.  ER records and tests reviewed.  Lower extremity ultrasound negative for DVT.  There was concern regarding possible pneumonia.  On review, cxr with emphysematous changes with right basilar scarring.  No acute abnormalities.  Was given prednisone.  CBC with elevated wbc's.  Has had recent steroid injection and now has completed prednisone taper.  Breathing stable.  No increased cough or congestion reported.  No chest pain or sob reported.  No acid reflux.  No abdominal pain.  Bowels moving.  Did stop cymbalta.  On questioning, she felt was causing cramps in her legs.  Tapered off.  Cramps have resolved.  Back on citalopram now.  Had questions about her knee.  Saw Vance Peper.  Had questions about her visit.  Printed visit and recommendations for her knee.  Discussed follow up.  She reports a friend saw an Secondary school teacher at [facility].  Wanted information about the orthopedist at that clinic.  Information printed and given to her.  Seeing podiatry for her feet. Appears to be doing better.     Past Medical History:  Diagnosis Date  . Anxiety   . Asthma   . Atypical chest pain    a. 2003 Cath: reportedly nl per pt [name]);  b. 04/2015 [place] Admission, neg troponin.  . Colon cancer ([place]) 1999   a. 1999: Pt had partial colectomy. Did develop lung metastasis. Had right upper lobectomy in 01/2007 then had associated chemotherapy.  Marland Kitchen COPD (chronic  obstructive pulmonary disease) ([place])   . DDD (degenerative disc disease), lumbosacral   . Depression   . Echocardiogram abnormal    a. 02/2010 Echo: EF 55-60%, mild LAE, no regional wall motion abnormalities  . Elevated transaminase level    a. ? NASH;  b. 06/2015 - nl LFTs.  . GERD (gastroesophageal reflux disease)   . Hematuria   . Holter monitor, abnormal    a. 12/2009 Holter monitoring: NSR, rare PACs and PVCs.  Marland Kitchen Hypercholesterolemia   . Hyperlipidemia   . Hypertension   . Leaky heart valve    a. Per pt report - no evidence of valvular abnormalities on prior echoes.  . Lesion of skin of Right breast 10/20/2017  . Lung metastases ([place]) 2008   a. S/P resection (lobectomy - right)  . Morbid obesity ([place])   . Nephrolithiasis   . Parkinson disease ([place]) 08/2016   [name]   Past Surgical History:  Procedure Laterality Date  . BREAST EXCISIONAL BIOPSY Right 10/07/2009   benign  . CARDIAC CATHETERIZATION  2003   negative as per pt report  . CHOLECYSTECTOMY  2000  . COLON SURGERY     Colon cancer, removed part of large colon  . COLONOSCOPY N/A 04/25/2015   Procedure: COLONOSCOPY;  Surgeon: Manya Silvas, MD;  Location: [facility] ENDOSCOPY;  Service: Endoscopy;  Laterality: N/A;  . COLONOSCOPY WITH PROPOFOL N/A 06/14/2017   Procedure: COLONOSCOPY WITH PROPOFOL;  Surgeon: Vira Agar,  Wilber Bihari, MD;  Location: [facility] ENDOSCOPY;  Service: Endoscopy;  Laterality: N/A;  . COLONOSCOPY WITH PROPOFOL N/A 11/17/2019   Procedure: COLONOSCOPY WITH PROPOFOL;  Surgeon: Wyline Mood, MD;  Location: [facility] ENDOSCOPY;  Service: Gastroenterology;  Laterality: N/A;  . LOBECTOMY  01/2007   Right lung, upper lobe right side removed  . TOTAL ABDOMINAL HYSTERECTOMY     abnormal bleeding  . TUBAL LIGATION  1980   Family History  Problem Relation Age of Onset  . Coronary artery disease Mother        died @ 42 of MI.  . Mental illness Mother   . Diabetes Mother   . Heart attack Father 72       MI  . Cancer  Father        lung - died in his mid-70's  . Hypertension Father   . Diabetes Father   . Sudden death Brother   . Mental illness Brother   . Heart attack Brother        MI @ 81, died @ age 27.  . Breast cancer Neg Hx    Social History   Socioeconomic History  . Marital status: Widowed    Spouse name: Not on file  . Number of children: Not on file  . Years of education: Not on file  . Highest education level: Not on file  Occupational History  . Not on file  Tobacco Use  . Smoking status: Passive Smoke Exposure - Never Smoker  . Smokeless tobacco: Never Used  . Tobacco comment: husband and son smoked in her home.  Vaping Use  . Vaping Use: Never used  Substance and Sexual Activity  . Alcohol use: Not Currently    Alcohol/week: 0.0 standard drinks  . Drug use: No  . Sexual activity: Never  Other Topics Concern  . Not on file  Social History Narrative   Widowed.   Disabled (colon/lung CA), retired.   Lives in [place] with her dtr, grand-dtr, and son.   Active.   Gets regular exercise, walks.   Social Determinants of Health   Financial Resource Strain: Not on file  Food Insecurity: Not on file  Transportation Needs: Not on file  Physical Activity: Not on file  Stress: Not on file  Social Connections: Not on file    Outpatient Encounter Medications as of 07/22/2020  Medication Sig  . ALPRAZolam (XANAX) 0.5 MG tablet Take 1/2 tablet q day prn  . amLODipine (NORVASC) 2.5 MG tablet Take 1 tablet (2.5 mg total) by mouth daily.  Marland Kitchen aspirin EC 81 MG tablet Take 81 mg by mouth as needed.   . calcium-vitamin D (OSCAL WITH D) 500-200 MG-UNIT tablet Take 1 tablet by mouth.  . carbidopa-levodopa (SINEMET) 25-100 MG tablet Take 1.5 in am and one at 11:00 and one in afternoon and 1.5 at night.  . cyclobenzaprine (FLEXERIL) 10 MG tablet Take 1 tablet (10 mg total) by mouth 3 (three) times daily as needed for muscle spasms.  . diclofenac Sodium (VOLTAREN) 1 % GEL Apply 2 g  topically 4 (four) times daily.  . ferrous sulfate 325 (65 FE) MG tablet Take 325 mg by mouth daily with breakfast.   . fluticasone (FLONASE) 50 MCG/ACT nasal spray Place 2 sprays into both nostrils daily.  . fluticasone-salmeterol (ADVAIR HFA) 115-21 MCG/ACT inhaler Inhale 2 puffs into the lungs 2 (two) times daily.  . meloxicam (MOBIC) 15 MG tablet Take 1 tablet (15 mg total) by mouth daily.  Marland Kitchen  montelukast (SINGULAIR) 10 MG tablet Take by mouth.  . mupirocin ointment (BACTROBAN) 2 % Apply 1 application topically 3 (three) times daily.  Marland Kitchen omeprazole (PRILOSEC) 20 MG capsule Take 1 capsule (20 mg total) by mouth 2 (two) times daily before a meal.  . ondansetron (ZOFRAN ODT) 4 MG disintegrating tablet Allow 1-2 tablets to dissolve in your mouth every 8 hours as needed for nausea/vomiting  . pramipexole (MIRAPEX) 0.25 MG tablet Take by mouth.  . predniSONE (STERAPRED UNI-PAK 21 TAB) 10 MG (21) TBPK tablet Per packaging instructions  . PROAIR HFA 108 (90 Base) MCG/ACT inhaler INHALE 2 PUFFS BY MOUTH INTO THE LUNGS EVERY 6 HOURS AS NEEDED FOR WHEEZING  . rosuvastatin (CRESTOR) 5 MG tablet Take 1 tablet (5 mg total) by mouth every Monday, Wednesday, and Friday.  Marland Kitchen Spacer/Aero-Holding Chambers (EASIVENT) inhaler See admin instructions.  . traZODone (DESYREL) 50 MG tablet TAKE 2 TABLETS BY MOUTH AT BEDTIME  . vitamin B-12 (CYANOCOBALAMIN) 100 MCG tablet Take 100 mcg by mouth daily.  . [DISCONTINUED] DULoxetine (CYMBALTA) 30 MG capsule Take 1 capsule (30 mg total) by mouth daily.   No facility-administered encounter medications on file as of 07/22/2020.    Review of Systems  Constitutional: Negative for appetite change and unexpected weight change.  HENT: Negative for congestion, sinus pressure and sore throat.   Respiratory: Negative for cough and chest tightness.        Breathing stable.   Cardiovascular: Negative for chest pain and palpitations.       No increased leg swelling now.    Gastrointestinal: Negative for abdominal pain, diarrhea, nausea and vomiting.  Genitourinary: Negative for difficulty urinating and dysuria.  Musculoskeletal: Negative for myalgias.       Knee issues as outlined.   Skin: Negative for color change and rash.  Neurological: Negative for dizziness, light-headedness and headaches.  Psychiatric/Behavioral: Negative for agitation and dysphoric mood.       Objective:    Physical Exam Vitals reviewed.  Constitutional:      General: She is not in acute distress.    Appearance: Normal appearance.  HENT:     Head: Normocephalic and atraumatic.     Right Ear: External ear normal.     Left Ear: External ear normal.     Mouth/Throat:     Mouth: Oropharynx is clear and moist.  Eyes:     General: No scleral icterus.       Right eye: No discharge.        Left eye: No discharge.     Conjunctiva/sclera: Conjunctivae normal.  Neck:     Thyroid: No thyromegaly.  Cardiovascular:     Rate and Rhythm: Normal rate and regular rhythm.  Pulmonary:     Effort: No respiratory distress.     Breath sounds: Normal breath sounds. No wheezing.  Abdominal:     General: Bowel sounds are normal.     Palpations: Abdomen is soft.     Tenderness: There is no abdominal tenderness.  Musculoskeletal:        General: No swelling, tenderness or edema.     Cervical back: Neck supple. No tenderness.  Lymphadenopathy:     Cervical: No cervical adenopathy.  Skin:    Findings: No erythema or rash.  Neurological:     Mental Status: She is alert.  Psychiatric:        Mood and Affect: Mood normal.        Behavior: Behavior normal.     BP  134/70   Pulse 78   Temp 97.7 F (36.5 C) (Oral)   Resp 18   Ht $R'5\' 3"'VI$  (1.6 m)   Wt 206 lb (93.4 kg)   SpO2 99%   BMI 36.49 kg/m  Wt Readings from Last 3 Encounters:  07/22/20 206 lb (93.4 kg)  07/09/20 204 lb (92.5 kg)  06/24/20 209 lb (94.8 kg)     Lab Results  Component Value Date   WBC 13.5 (H) 07/09/2020    HGB 12.2 07/09/2020   HCT 38.8 07/09/2020   PLT 313 07/09/2020   GLUCOSE 102 (H) 07/09/2020   CHOL 140 07/07/2020   TRIG 68.0 07/07/2020   HDL 62.40 07/07/2020   LDLDIRECT 163.4 12/31/2012   LDLCALC 64 07/07/2020   ALT <5 07/09/2020   AST 17 07/09/2020   NA 137 07/09/2020   K 4.6 07/09/2020   CL 102 07/09/2020   CREATININE 1.15 (H) 07/09/2020   BUN 25 (H) 07/09/2020   CO2 26 07/09/2020   TSH 1.86 12/17/2019   INR 1.0 12/12/2012   HGBA1C 6.1 07/07/2020    DG Chest 2 View  Result Date: 07/09/2020 CLINICAL DATA:  Shortness of breath EXAM: CHEST - 2 VIEW COMPARISON:  11/02/2019 FINDINGS: Enlargement of cardiac silhouette. Mediastinal contours and pulmonary vascularity normal. Emphysematous changes with minimal scarring at RIGHT base. No acute infiltrate, pleural effusion, or pneumothorax. Bones demineralized with mildly accentuated thoracic kyphosis. IMPRESSION: Enlargement of cardiac silhouette. Emphysematous changes with RIGHT basilar scarring. No acute abnormalities. Electronically Signed   By: Lavonia Dana M.D.   On: 07/09/2020 17:27   US Venous Img Lower Unilateral Left  Result Date: 07/09/2020 CLINICAL DATA:  Calf pain EXAM: BILATERAL LOWER EXTREMITY VENOUS DOPPLER ULTRASOUND TECHNIQUE: Gray-scale sonography with graded compression, as well as color Doppler and duplex ultrasound were performed to evaluate the lower extremity deep venous systems from the level of the common femoral vein and including the common femoral, femoral, profunda femoral, popliteal and calf veins including the posterior tibial, peroneal and gastrocnemius veins when visible. The superficial great saphenous vein was also interrogated. Spectral Doppler was utilized to evaluate flow at rest and with distal augmentation maneuvers in the common femoral, femoral and popliteal veins. COMPARISON:  None. FINDINGS: RIGHT LOWER EXTREMITY Common Femoral Vein: No evidence of thrombus. Normal compressibility, respiratory  phasicity and response to augmentation. Saphenofemoral Junction: No evidence of thrombus. Normal compressibility and flow on color Doppler imaging. Profunda Femoral Vein: No evidence of thrombus. Normal compressibility and flow on color Doppler imaging. Femoral Vein: No evidence of thrombus. Normal compressibility, respiratory phasicity and response to augmentation. Popliteal Vein: No evidence of thrombus. Normal compressibility, respiratory phasicity and response to augmentation. Calf Veins: No evidence of thrombus. Normal compressibility and flow on color Doppler imaging. Superficial Great Saphenous Vein: No evidence of thrombus. Normal compressibility. Venous Reflux:  None. Other Findings:  None. LEFT LOWER EXTREMITY Common Femoral Vein: No evidence of thrombus. Normal compressibility, respiratory phasicity and response to augmentation. Saphenofemoral Junction: No evidence of thrombus. Normal compressibility and flow on color Doppler imaging. Profunda Femoral Vein: No evidence of thrombus. Normal compressibility and flow on color Doppler imaging. Femoral Vein: No evidence of thrombus. Normal compressibility, respiratory phasicity and response to augmentation. Popliteal Vein: No evidence of thrombus. Normal compressibility, respiratory phasicity and response to augmentation. Calf Veins: No evidence of thrombus. Normal compressibility and flow on color Doppler imaging. Superficial Great Saphenous Vein: No evidence of thrombus. Normal compressibility. Venous Reflux:  None. Other Findings:  None. IMPRESSION: No  evidence of deep venous thrombosis in either lower extremity. Electronically Signed   By: Alcide Clever M.D.   On: 07/09/2020 20:15   US Venous Img Lower Unilateral Right  Result Date: 07/09/2020 CLINICAL DATA:  Calf pain EXAM: BILATERAL LOWER EXTREMITY VENOUS DOPPLER ULTRASOUND TECHNIQUE: Gray-scale sonography with graded compression, as well as color Doppler and duplex ultrasound were performed to  evaluate the lower extremity deep venous systems from the level of the common femoral vein and including the common femoral, femoral, profunda femoral, popliteal and calf veins including the posterior tibial, peroneal and gastrocnemius veins when visible. The superficial great saphenous vein was also interrogated. Spectral Doppler was utilized to evaluate flow at rest and with distal augmentation maneuvers in the common femoral, femoral and popliteal veins. COMPARISON:  None. FINDINGS: RIGHT LOWER EXTREMITY Common Femoral Vein: No evidence of thrombus. Normal compressibility, respiratory phasicity and response to augmentation. Saphenofemoral Junction: No evidence of thrombus. Normal compressibility and flow on color Doppler imaging. Profunda Femoral Vein: No evidence of thrombus. Normal compressibility and flow on color Doppler imaging. Femoral Vein: No evidence of thrombus. Normal compressibility, respiratory phasicity and response to augmentation. Popliteal Vein: No evidence of thrombus. Normal compressibility, respiratory phasicity and response to augmentation. Calf Veins: No evidence of thrombus. Normal compressibility and flow on color Doppler imaging. Superficial Great Saphenous Vein: No evidence of thrombus. Normal compressibility. Venous Reflux:  None. Other Findings:  None. LEFT LOWER EXTREMITY Common Femoral Vein: No evidence of thrombus. Normal compressibility, respiratory phasicity and response to augmentation. Saphenofemoral Junction: No evidence of thrombus. Normal compressibility and flow on color Doppler imaging. Profunda Femoral Vein: No evidence of thrombus. Normal compressibility and flow on color Doppler imaging. Femoral Vein: No evidence of thrombus. Normal compressibility, respiratory phasicity and response to augmentation. Popliteal Vein: No evidence of thrombus. Normal compressibility, respiratory phasicity and response to augmentation. Calf Veins: No evidence of thrombus. Normal  compressibility and flow on color Doppler imaging. Superficial Great Saphenous Vein: No evidence of thrombus. Normal compressibility. Venous Reflux:  None. Other Findings:  None. IMPRESSION: No evidence of deep venous thrombosis in either lower extremity. Electronically Signed   By: Alcide Clever M.D.   On: 07/09/2020 20:15       Assessment & Plan:   Problem List Items Addressed This Visit    Carotid artery disease (HCC)    Saw [facility] 07/2016 - carotid ultrasound - <30% bilateral.  Recommended f/u prn.       Chronic obstructive pulmonary disease (HCC)    Continue advair.  Breathing overall stable.        CKD (chronic kidney disease), stage III (HCC)    Try to minimize amount of antiinflammatories.  Stay hydrated.  Follow metabolic panel.       Depression, recurrent (HCC)    Off cymbalta.  Back on citalopram.  Stable.  Follow.       Elevated liver function tests    Recent check wnl.  CT as outlined previously.  Had referred her back to GI for evaluation of persistent intrahepatic and extrahepatic biliary ductal dilatation.  Apparently did not make appt.  Will discuss with her regarding getting this rescheduled.        Fibromuscular dysplasia (HCC)    Has been evaluated by [facility].  Continue risk factor modification.       Foot pain, bilateral    Seeing podiatry. Appears to be doing better.  Follow.       GERD (gastroesophageal reflux disease)    No upper  symptoms reported.  On prilosec.       History of malignant neoplasm metastatic to lung    S/p lobectomy.  Has been followed by pulmonary.  Breathing stable.       Hyperglycemia    Low carb diet and exercise.  Follow met b and a1c       Hypertension    Blood pressure on recheck improved.  Continue amlodipine.  Hold on making changes in her medication.  Follow pressures.  Follow metabolic panel.       Morbid obesity ([place])    BMI 49 with associated hypertension and hypercholesterolemia.  Diet. Exercise.  Weight loss.   Follow.       Parkinson's disease ([place])    Followed by neurology.  Continue sinemet and mirapex.        Pure hypercholesterolemia    Continue crestor.  Low cholesterol diet and exercise.  Follow lipid panel and liver function tests.            Einar Pheasant, MD

## 2020-07-24 ENCOUNTER — Encounter: Payer: Self-pay | Admitting: Internal Medicine

## 2020-07-24 NOTE — Assessment & Plan Note (Signed)
Continue advair.  Breathing overall stable.

## 2020-07-24 NOTE — Assessment & Plan Note (Signed)
Saw [facility] 07/2016 - carotid ultrasound - <30% bilateral.  Recommended f/u prn.

## 2020-07-24 NOTE — Assessment & Plan Note (Signed)
Followed by neurology.  Continue sinemet and mirapex.

## 2020-07-24 NOTE — Assessment & Plan Note (Signed)
Blood pressure on recheck improved.  Continue amlodipine.  Hold on making changes in her medication.  Follow pressures.  Follow metabolic panel.

## 2020-07-24 NOTE — Assessment & Plan Note (Signed)
Recent check wnl.  CT as outlined previously.  Had referred her back to GI for evaluation of persistent intrahepatic and extrahepatic biliary ductal dilatation.  Apparently did not make appt.  Will discuss with her regarding getting this rescheduled.

## 2020-07-24 NOTE — Assessment & Plan Note (Signed)
Continue crestor.  Low cholesterol diet and exercise. Follow lipid panel and liver function tests.   

## 2020-07-24 NOTE — Assessment & Plan Note (Signed)
BMI 49 with associated hypertension and hypercholesterolemia.  Diet. Exercise.  Weight loss.  Follow.

## 2020-07-24 NOTE — Assessment & Plan Note (Signed)
Try to minimize amount of antiinflammatories.  Stay hydrated.  Follow metabolic panel.

## 2020-07-24 NOTE — Assessment & Plan Note (Signed)
Off cymbalta.  Back on citalopram.  Stable.  Follow.

## 2020-07-24 NOTE — Assessment & Plan Note (Signed)
Seeing podiatry. Appears to be doing better.  Follow.

## 2020-07-24 NOTE — Assessment & Plan Note (Signed)
S/p lobectomy.  Has been followed by pulmonary.  Breathing stable.

## 2020-07-24 NOTE — Assessment & Plan Note (Signed)
Low carb diet and exercise.  Follow met b and a1c.   

## 2020-07-24 NOTE — Assessment & Plan Note (Signed)
No upper symptoms reported. On prilosec.  

## 2020-07-24 NOTE — Assessment & Plan Note (Signed)
Has been evaluated by [facility].  Continue risk factor modification.

## 2020-07-25 ENCOUNTER — Telehealth: Payer: Self-pay

## 2020-07-25 DIAGNOSIS — M25562 Pain in left knee: Secondary | ICD-10-CM | POA: Diagnosis not present

## 2020-07-25 DIAGNOSIS — M1711 Unilateral primary osteoarthritis, right knee: Secondary | ICD-10-CM | POA: Diagnosis not present

## 2020-07-25 DIAGNOSIS — M84362A Stress fracture, left tibia, initial encounter for fracture: Secondary | ICD-10-CM | POA: Diagnosis not present

## 2020-07-25 NOTE — Telephone Encounter (Signed)
FYI - I will call her back but she wants to see Dr Marry Guan.

## 2020-07-25 NOTE — Telephone Encounter (Signed)
Order can be placed for ortho referral (Dr Marry Guan).  Vance Peper works with Dr Marry Guan.

## 2020-07-25 NOTE — Telephone Encounter (Signed)
Pt called and states that she would like Dr Nicki Reaper to send referral to Dr Skip Estimable at [facility] for ortho.  Pt would like a call back as well.

## 2020-07-26 NOTE — Telephone Encounter (Signed)
Patient is aware and said that she went to [facility] at [place] and they are sending her for a MRI. She scheduled herself an appt with Dr Marry Guan for April 5 but may cancel it depending on what the MRI shows.

## 2020-08-01 ENCOUNTER — Telehealth: Payer: Self-pay

## 2020-08-01 NOTE — Telephone Encounter (Signed)
She had informed me at her last visit, that she had stopped cymbalta and restarted lexapro.  She had been on lexapro previously.  I thought she had restarted the medication she had previously. Please confirm needs rx sent in for lexapro 10mg  q day.  If so, ok to send in lexapro 10mg  q day #30 with one refill.

## 2020-08-01 NOTE — Telephone Encounter (Signed)
Confirm she is not taking cymbalta or citalopram currently.  If no and wants to start lexapro, can start 10mg  q day.  (lexapro 10mg  q day #30 with one refill).

## 2020-08-01 NOTE — Telephone Encounter (Signed)
Pt would like Dr Nicki Reaper to call in Lexapro for her to try for anxiety and depression. She would like to get started on this soon so she can talk about it with Dr Nicki Reaper at next appt/ -Please call pt back to advise

## 2020-08-01 NOTE — Telephone Encounter (Signed)
Is this something that was discussed at her last visit?

## 2020-08-01 NOTE — Telephone Encounter (Signed)
Patient has not been on lexapro in a few years. She was weaning off of citalopram to switch to cymbalta and then went back on the citalopram. Per chart review, the last refill of lexapro was 2014.

## 2020-08-02 ENCOUNTER — Ambulatory Visit: Payer: Medicare Other | Admitting: Podiatry

## 2020-08-02 NOTE — Telephone Encounter (Signed)
Patient called in about lexapro she has agreed to take lexapro wanted to know if Dr.Scott has called the pharmacy for a refill

## 2020-08-03 ENCOUNTER — Other Ambulatory Visit: Payer: Self-pay

## 2020-08-03 DIAGNOSIS — G4752 REM sleep behavior disorder: Secondary | ICD-10-CM | POA: Diagnosis not present

## 2020-08-03 DIAGNOSIS — G20 Parkinson's disease: Secondary | ICD-10-CM | POA: Diagnosis not present

## 2020-08-03 NOTE — Telephone Encounter (Signed)
Called and spoke with patients family member. Advised to have her call me back. I have sent in lexapro.

## 2020-08-03 NOTE — Telephone Encounter (Signed)
Pt called again and would like to get on Lexapro asap so she will have it in her system by the time she sees Dr Nicki Reaper again. Pt is not on cymbalta or other as of right now. Pt is getting upset that no one has returned her call. Please advise today

## 2020-08-03 NOTE — Telephone Encounter (Signed)
Per reviewing chart lexapro is listed as an allergy. Will hold on sending in until I speak with pt to confirm what kind of allergy.

## 2020-08-04 DIAGNOSIS — S83272A Complex tear of lateral meniscus, current injury, left knee, initial encounter: Secondary | ICD-10-CM | POA: Diagnosis not present

## 2020-08-04 DIAGNOSIS — M659 Synovitis and tenosynovitis, unspecified: Secondary | ICD-10-CM | POA: Diagnosis not present

## 2020-08-04 DIAGNOSIS — M948X8 Other specified disorders of cartilage, other site: Secondary | ICD-10-CM | POA: Diagnosis not present

## 2020-08-04 DIAGNOSIS — M25462 Effusion, left knee: Secondary | ICD-10-CM | POA: Diagnosis not present

## 2020-08-04 DIAGNOSIS — S83232A Complex tear of medial meniscus, current injury, left knee, initial encounter: Secondary | ICD-10-CM | POA: Diagnosis not present

## 2020-08-04 DIAGNOSIS — M84352A Stress fracture, left femur, initial encounter for fracture: Secondary | ICD-10-CM | POA: Diagnosis not present

## 2020-08-04 DIAGNOSIS — S82142A Displaced bicondylar fracture of left tibia, initial encounter for closed fracture: Secondary | ICD-10-CM | POA: Diagnosis not present

## 2020-08-04 DIAGNOSIS — M84362A Stress fracture, left tibia, initial encounter for fracture: Secondary | ICD-10-CM | POA: Diagnosis not present

## 2020-08-04 DIAGNOSIS — S72432A Displaced fracture of medial condyle of left femur, initial encounter for closed fracture: Secondary | ICD-10-CM | POA: Diagnosis not present

## 2020-08-04 NOTE — Telephone Encounter (Signed)
Patient returned office phone call, see note below.

## 2020-08-04 NOTE — Telephone Encounter (Signed)
Patient aware of below and has been scheduled for a telephone visit with Dr Nicki Reaper to discuss medication options.

## 2020-08-09 HISTORY — PX: REPLACEMENT TOTAL KNEE: SUR1224

## 2020-08-12 ENCOUNTER — Telehealth (INDEPENDENT_AMBULATORY_CARE_PROVIDER_SITE_OTHER): Payer: Medicare Other | Admitting: Internal Medicine

## 2020-08-12 DIAGNOSIS — F419 Anxiety disorder, unspecified: Secondary | ICD-10-CM

## 2020-08-12 DIAGNOSIS — N183 Chronic kidney disease, stage 3 unspecified: Secondary | ICD-10-CM | POA: Diagnosis not present

## 2020-08-12 DIAGNOSIS — G20 Parkinson's disease: Secondary | ICD-10-CM

## 2020-08-12 DIAGNOSIS — I10 Essential (primary) hypertension: Secondary | ICD-10-CM | POA: Diagnosis not present

## 2020-08-12 MED ORDER — BUPROPION HCL ER (XL) 150 MG PO TB24: 150.0000 mg | ORAL_TABLET | Freq: Every day | ORAL | 1 refills | Status: DC

## 2020-08-12 NOTE — Progress Notes (Signed)
Patient ID: Victoria Hanson, female   DOB: 03-11-1951, 70 y.o.   MRN: [id number]   Virtual Visit via telephone Note  This visit type was conducted due to national recommendations for restrictions regarding the COVID-19 pandemic (e.g. social distancing).  This format is felt to be most appropriate for this patient at this time.  All issues noted in this document were discussed and addressed.  No physical exam was performed (except for noted visual exam findings with Video Visits).   I connected with [name] by telephone and verified that I am speaking with the correct person using two identifiers. Location patient: home Location provider: work  Persons participating in the telephone visit: patient, provider  The limitations, risks, security and privacy concerns of performing an evaluation and management service by telephone and the availability of in person appointments have been discussed.  It has also been discussed with the patient that there may be a patient responsible charge related to this service. The patient expressed understanding and agreed to proceed.   Reason for visit: work in appt  HPI: Work in with concerns regarding increased stress/anxiety.  On celexa now. Does not feel it helps.  She was on cymbalta and felt this worked well to control anxiety, but felt it caused leg discomfort.  She has been seeing neurology.  Was started on mirapex for restless legs.  Had to stop.  Felt caused hallucinations.  She is also being evaluated at [facility] for leg pain and had MRI to r/o fracture - left knee.  Discussed this could have contributed to her leg pain (which she related to cymbalta).  She prefers to try a new medication.  Feels needs something to help control her anxiety.   Neurology had suggested wellbutrin.  She is interested in trying this medication.  Has noticed increased anxiety.  Some increased crying.  Eating.  No suicidal ideations.    ROS: See pertinent positives and negatives  per HPI.  Past Medical History:  Diagnosis Date  . Anxiety   . Asthma   . Atypical chest pain    a. 2003 Cath: reportedly nl per pt [name]);  b. 04/2015 [facility] Admission, neg troponin.  . Colon cancer ([place]) 1999   a. 1999: Pt had partial colectomy. Did develop lung metastasis. Had right upper lobectomy in 01/2007 then had associated chemotherapy.  Marland Kitchen COPD (chronic obstructive pulmonary disease) ([place])   . DDD (degenerative disc disease), lumbosacral   . Depression   . Echocardiogram abnormal    a. 02/2010 Echo: EF 55-60%, mild LAE, no regional wall motion abnormalities  . Elevated transaminase level    a. ? NASH;  b. 06/2015 - nl LFTs.  . GERD (gastroesophageal reflux disease)   . Hematuria   . Holter monitor, abnormal    a. 12/2009 Holter monitoring: NSR, rare PACs and PVCs.  Marland Kitchen Hypercholesterolemia   . Hyperlipidemia   . Hypertension   . Leaky heart valve    a. Per pt report - no evidence of valvular abnormalities on prior echoes.  . Lesion of skin of Right breast 10/20/2017  . Lung metastases ([place]) 2008   a. S/P resection (lobectomy - right)  . Morbid obesity ([place])   . Nephrolithiasis   . Parkinson disease ([place]) 08/2016   [name]    Past Surgical History:  Procedure Laterality Date  . BREAST EXCISIONAL BIOPSY Right 10/07/2009   benign  . CARDIAC CATHETERIZATION  2003   negative as per pt report  . CHOLECYSTECTOMY  2000  .  COLON SURGERY     Colon cancer, removed part of large colon  . COLONOSCOPY N/A 04/25/2015   Procedure: COLONOSCOPY;  Surgeon: Manya Silvas, MD;  Location: [facility] ENDOSCOPY;  Service: Endoscopy;  Laterality: N/A;  . COLONOSCOPY WITH PROPOFOL N/A 06/14/2017   Procedure: COLONOSCOPY WITH PROPOFOL;  Surgeon: Manya Silvas, MD;  Location[facility] ENDOSCOPY;  Service: Endoscopy;  Laterality: N/A;  . COLONOSCOPY WITH PROPOFOL N/A 11/17/2019   Procedure: COLONOSCOPY WITH PROPOFOL;  Surgeon: [name], MD;  Location: [facility] ENDOSCOPY;  Service: Gastroenterology;   Laterality: N/A;  . LOBECTOMY  01/2007   Right lung, upper lobe right side removed  . TOTAL ABDOMINAL HYSTERECTOMY     abnormal bleeding  . TUBAL LIGATION  1980    Family History  Problem Relation Age of Onset  . Coronary artery disease Mother        died @ 75 of MI.  . Mental illness Mother   . Diabetes Mother   . Heart attack Father 40       MI  . Cancer Father        lung - died in his mid-70's  . Hypertension Father   . Diabetes Father   . Sudden death Brother   . Mental illness Brother   . Heart attack Brother        MI @ 6, died @ age 29.  . Breast cancer Neg Hx     SOCIAL HX: reviewed.    Current Outpatient Medications:  .  buPROPion (WELLBUTRIN XL) 150 MG 24 hr tablet, Take 1 tablet (150 mg total) by mouth daily., Disp: 30 tablet, Rfl: 1 .  ALPRAZolam (XANAX) 0.5 MG tablet, Take 1/2 tablet q day prn, Disp: 30 tablet, Rfl: 0 .  amLODipine (NORVASC) 2.5 MG tablet, Take 1 tablet (2.5 mg total) by mouth daily., Disp: 90 tablet, Rfl: 1 .  aspirin EC 81 MG tablet, Take 81 mg by mouth as needed. , Disp: , Rfl:  .  calcium-vitamin D (OSCAL WITH D) 500-200 MG-UNIT tablet, Take 1 tablet by mouth., Disp: , Rfl:  .  carbidopa-levodopa (SINEMET) 25-100 MG tablet, Take 1.5 in am and one at 11:00 and one in afternoon and 1.5 at night., Disp: 150 tablet, Rfl: 0 .  cyclobenzaprine (FLEXERIL) 10 MG tablet, Take 1 tablet (10 mg total) by mouth 3 (three) times daily as needed for muscle spasms., Disp: 90 tablet, Rfl: 0 .  diclofenac Sodium (VOLTAREN) 1 % GEL, Apply 2 g topically 4 (four) times daily., Disp: 50 g, Rfl: 0 .  ferrous sulfate 325 (65 FE) MG tablet, Take 325 mg by mouth daily with breakfast. , Disp: , Rfl:  .  fluticasone (FLONASE) 50 MCG/ACT nasal spray, Place 2 sprays into both nostrils daily., Disp: 16 g, Rfl: 6 .  fluticasone-salmeterol (ADVAIR HFA) 115-21 MCG/ACT inhaler, Inhale 2 puffs into the lungs 2 (two) times daily., Disp: 1 Inhaler, Rfl: 4 .  meloxicam (MOBIC)  15 MG tablet, Take 1 tablet (15 mg total) by mouth daily., Disp: 30 tablet, Rfl: 1 .  montelukast (SINGULAIR) 10 MG tablet, Take by mouth., Disp: , Rfl:  .  mupirocin ointment (BACTROBAN) 2 %, Apply 1 application topically 3 (three) times daily., Disp: 30 g, Rfl: 0 .  omeprazole (PRILOSEC) 20 MG capsule, Take 1 capsule (20 mg total) by mouth 2 (two) times daily before a meal., Disp: 180 capsule, Rfl: 1 .  ondansetron (ZOFRAN ODT) 4 MG disintegrating tablet, Allow 1-2 tablets to dissolve in  your mouth every 8 hours as needed for nausea/vomiting, Disp: 30 tablet, Rfl: 0 .  pramipexole (MIRAPEX) 0.25 MG tablet, Take by mouth., Disp: , Rfl:  .  PROAIR HFA 108 (90 Base) MCG/ACT inhaler, INHALE 2 PUFFS BY MOUTH INTO THE LUNGS EVERY 6 HOURS AS NEEDED FOR WHEEZING, Disp: 8.5 g, Rfl: 2 .  rosuvastatin (CRESTOR) 5 MG tablet, Take 1 tablet (5 mg total) by mouth every Monday, Wednesday, and Friday., Disp: 90 tablet, Rfl: 1 .  Spacer/Aero-Holding Chambers (EASIVENT) inhaler, See admin instructions., Disp: , Rfl:  .  traZODone (DESYREL) 50 MG tablet, TAKE 2 TABLETS BY MOUTH AT BEDTIME, Disp: 180 tablet, Rfl: 1 .  vitamin B-12 (CYANOCOBALAMIN) 100 MCG tablet, Take 100 mcg by mouth daily., Disp: , Rfl:   EXAM:  GENERAL: alert. Sound to be in no acute distress.  Answering questions appropriately.   PSYCH/NEURO: pleasant and cooperative, no obvious depression or anxiety, speech and thought processing grossly intact  ASSESSMENT AND PLAN:  Discussed the following assessment and plan:  Problem List Items Addressed This Visit    Anxiety, mild    Has tried cymbalta as outlined.  Stopped due to leg pain as outlined.  Currently on citalopram.  Does not feel helps. Discussed increasing dose. She wants to try another medication.  Neurology recommended trial of wellbutrin. Discussed with her.  She is agreeable.  Start wellbutrin XL 150mg  q day.  Follow.  Keep soon f/u to confirm tolerating and doing ok.         Relevant Medications   buPROPion (WELLBUTRIN XL) 150 MG 24 hr tablet   CKD (chronic kidney disease), stage III ([place])    Stay hydrated.  Avoid antiinflammatories.  Follow metabolic panel.       Hypertension    Blood pressure has been doing well.  Continue amlodipine.  Follow pressures.  Follow metabolic panel.       Parkinson's disease ([place])    Followed by neurology.  On sinemet.  Had to stop mirapex as outlined.  Follow.           I discussed the assessment and treatment plan with the patient. The patient was provided an opportunity to ask questions and all were answered. The patient agreed with the plan and demonstrated an understanding of the instructions.   The patient was advised to call back or seek an in-person evaluation if the symptoms worsen or if the condition fails to improve as anticipated.  I provided 25 minutes of non-face-to-face time during this encounter.   Einar Pheasant, MD

## 2020-08-13 ENCOUNTER — Other Ambulatory Visit: Payer: Self-pay | Admitting: Internal Medicine

## 2020-08-14 ENCOUNTER — Encounter: Payer: Self-pay | Admitting: Internal Medicine

## 2020-08-14 NOTE — Assessment & Plan Note (Signed)
Followed by neurology.  On sinemet.  Had to stop mirapex as outlined.  Follow.

## 2020-08-14 NOTE — Assessment & Plan Note (Signed)
Stay hydrated.  Avoid antiinflammatories.  Follow metabolic panel.

## 2020-08-14 NOTE — Assessment & Plan Note (Signed)
Blood pressure has been doing well.  Continue amlodipine.  Follow pressures.  Follow metabolic panel.

## 2020-08-14 NOTE — Assessment & Plan Note (Signed)
Has tried cymbalta as outlined.  Stopped due to leg pain as outlined.  Currently on citalopram.  Does not feel helps. Discussed increasing dose. She wants to try another medication.  Neurology recommended trial of wellbutrin. Discussed with her.  She is agreeable.  Start wellbutrin XL 150mg  q day.  Follow.  Keep soon f/u to confirm tolerating and doing ok.

## 2020-08-18 ENCOUNTER — Emergency Department
Admission: EM | Admit: 2020-08-18 | Discharge: 2020-08-18 | Disposition: A | Discharge status: Home / Self Care | Payer: Medicare Other | Attending: Emergency Medicine | Admitting: Emergency Medicine

## 2020-08-18 ENCOUNTER — Other Ambulatory Visit: Payer: Self-pay

## 2020-08-18 ENCOUNTER — Telehealth: Payer: Self-pay

## 2020-08-18 ENCOUNTER — Emergency Department: Payer: Medicare Other

## 2020-08-18 DIAGNOSIS — M7989 Other specified soft tissue disorders: Secondary | ICD-10-CM | POA: Diagnosis not present

## 2020-08-18 DIAGNOSIS — Z853 Personal history of malignant neoplasm of breast: Secondary | ICD-10-CM | POA: Diagnosis not present

## 2020-08-18 DIAGNOSIS — Z7982 Long term (current) use of aspirin: Secondary | ICD-10-CM | POA: Insufficient documentation

## 2020-08-18 DIAGNOSIS — Z79899 Other long term (current) drug therapy: Secondary | ICD-10-CM | POA: Insufficient documentation

## 2020-08-18 DIAGNOSIS — J45909 Unspecified asthma, uncomplicated: Secondary | ICD-10-CM | POA: Insufficient documentation

## 2020-08-18 DIAGNOSIS — R0602 Shortness of breath: Secondary | ICD-10-CM | POA: Diagnosis present

## 2020-08-18 DIAGNOSIS — I130 Hypertensive heart and chronic kidney disease with heart failure and stage 1 through stage 4 chronic kidney disease, or unspecified chronic kidney disease: Secondary | ICD-10-CM | POA: Diagnosis not present

## 2020-08-18 DIAGNOSIS — Z85118 Personal history of other malignant neoplasm of bronchus and lung: Secondary | ICD-10-CM | POA: Insufficient documentation

## 2020-08-18 DIAGNOSIS — J449 Chronic obstructive pulmonary disease, unspecified: Secondary | ICD-10-CM | POA: Diagnosis not present

## 2020-08-18 DIAGNOSIS — I5021 Acute systolic (congestive) heart failure: Secondary | ICD-10-CM | POA: Insufficient documentation

## 2020-08-18 DIAGNOSIS — G20 Parkinson's disease: Secondary | ICD-10-CM | POA: Insufficient documentation

## 2020-08-18 DIAGNOSIS — R0600 Dyspnea, unspecified: Secondary | ICD-10-CM | POA: Diagnosis not present

## 2020-08-18 DIAGNOSIS — Z7952 Long term (current) use of systemic steroids: Secondary | ICD-10-CM | POA: Diagnosis not present

## 2020-08-18 DIAGNOSIS — R079 Chest pain, unspecified: Secondary | ICD-10-CM | POA: Diagnosis not present

## 2020-08-18 DIAGNOSIS — Z7722 Contact with and (suspected) exposure to environmental tobacco smoke (acute) (chronic): Secondary | ICD-10-CM | POA: Diagnosis not present

## 2020-08-18 DIAGNOSIS — I110 Hypertensive heart disease with heart failure: Secondary | ICD-10-CM | POA: Diagnosis not present

## 2020-08-18 DIAGNOSIS — Z85038 Personal history of other malignant neoplasm of large intestine: Secondary | ICD-10-CM | POA: Insufficient documentation

## 2020-08-18 DIAGNOSIS — R2241 Localized swelling, mass and lump, right lower limb: Secondary | ICD-10-CM | POA: Diagnosis not present

## 2020-08-18 DIAGNOSIS — R609 Edema, unspecified: Secondary | ICD-10-CM

## 2020-08-18 DIAGNOSIS — N183 Chronic kidney disease, stage 3 unspecified: Secondary | ICD-10-CM | POA: Diagnosis not present

## 2020-08-18 DIAGNOSIS — R600 Localized edema: Secondary | ICD-10-CM | POA: Diagnosis not present

## 2020-08-18 DIAGNOSIS — M79604 Pain in right leg: Secondary | ICD-10-CM | POA: Insufficient documentation

## 2020-08-18 DIAGNOSIS — I517 Cardiomegaly: Secondary | ICD-10-CM | POA: Diagnosis not present

## 2020-08-18 LAB — D-DIMER, QUANTITATIVE: D-Dimer, Quant: 1.12 ug/mL-FEU — ABNORMAL HIGH (ref 0.00–0.50)

## 2020-08-18 LAB — URINALYSIS, COMPLETE (UACMP) WITH MICROSCOPIC
Bacteria, UA: NONE SEEN
Bilirubin Urine: NEGATIVE
Glucose, UA: NEGATIVE mg/dL
Ketones, ur: NEGATIVE mg/dL
Leukocytes,Ua: NEGATIVE
Nitrite: NEGATIVE
Protein, ur: NEGATIVE mg/dL
Specific Gravity, Urine: 1.002 — ABNORMAL LOW (ref 1.005–1.030)
WBC, UA: NONE SEEN WBC/hpf (ref 0–5)
pH: 7 (ref 5.0–8.0)

## 2020-08-18 LAB — HEPATIC FUNCTION PANEL
ALT: <5 U/L (ref 0–44)
AST: 15 U/L (ref 15–41)
Albumin: 3.7 g/dL (ref 3.5–5.0)
Alkaline Phosphatase: 82 U/L (ref 38–126)
Bilirubin, Direct: <0.1 mg/dL (ref 0.0–0.2)
Total Bilirubin: 0.5 mg/dL (ref 0.3–1.2)
Total Protein: 7 g/dL (ref 6.5–8.1)

## 2020-08-18 LAB — BASIC METABOLIC PANEL
Anion gap: 10 (ref 5–15)
BUN: 17 mg/dL (ref 8–23)
CO2: 23 mmol/L (ref 22–32)
Calcium: 9 mg/dL (ref 8.9–10.3)
Chloride: 103 mmol/L (ref 98–111)
Creatinine, Ser: 1.16 mg/dL — ABNORMAL HIGH (ref 0.44–1.00)
GFR, Estimated: 51 mL/min — ABNORMAL LOW (ref >60)
Glucose, Bld: 133 mg/dL — ABNORMAL HIGH (ref 70–99)
Potassium: 3.7 mmol/L (ref 3.5–5.1)
Sodium: 136 mmol/L (ref 135–145)

## 2020-08-18 LAB — CBC
HCT: 36.9 % (ref 36.0–46.0)
Hemoglobin: 11.9 g/dL — ABNORMAL LOW (ref 12.0–15.0)
MCH: 29 pg (ref 26.0–34.0)
MCHC: 32.2 g/dL (ref 30.0–36.0)
MCV: 90 fL (ref 80.0–100.0)
Platelets: 320 10*3/uL (ref 150–400)
RBC: 4.1 MIL/uL (ref 3.87–5.11)
RDW: 13.5 % (ref 11.5–15.5)
WBC: 9 10*3/uL (ref 4.0–10.5)
nRBC: 0 % (ref 0.0–0.2)

## 2020-08-18 LAB — TROPONIN I (HIGH SENSITIVITY): Troponin I (High Sensitivity): 7 ng/L (ref <18)

## 2020-08-18 LAB — BRAIN NATRIURETIC PEPTIDE: B Natriuretic Peptide: 116.6 pg/mL — ABNORMAL HIGH (ref 0.0–100.0)

## 2020-08-18 MED ORDER — FUROSEMIDE 20 MG PO TABS: 20.0000 mg | ORAL_TABLET | Freq: Every day | ORAL | 0 refills | Status: DC

## 2020-08-18 MED ORDER — ALPRAZOLAM 0.25 MG PO TABS
0.2500 mg | ORAL_TABLET | Freq: Once | ORAL | Status: AC
  Administered 2020-08-18: 0.25 mg via ORAL
  Filled 2020-08-18: qty 1

## 2020-08-18 MED ORDER — IOHEXOL 350 MG/ML SOLN
75.0000 mL | Freq: Once | INTRAVENOUS | Status: AC | PRN
  Administered 2020-08-18: 75 mL via INTRAVENOUS
  Filled 2020-08-18: qty 75

## 2020-08-18 MED ORDER — FUROSEMIDE 10 MG/ML IJ SOLN
40.0000 mg | Freq: Once | INTRAMUSCULAR | Status: AC
  Administered 2020-08-18: 40 mg via INTRAVENOUS
  Filled 2020-08-18: qty 4

## 2020-08-18 NOTE — ED Provider Notes (Signed)
[facility] Emergency Department Provider Note  ____________________________________________   Event Date/Time   First MD Initiated Contact with Patient 08/18/20 1222     (approximate)  I have reviewed the triage vital signs and the nursing notes.   HISTORY  Chief Complaint Leg Swelling    HPI Victoria Hanson is a 70 y.o. female is emergency department complaining of  right lower leg pain and swelling.  She noticed it 2 days ago.  Has a lump on the leg.  Patient is also been very dizzy and short of breath.  Some chest pain.  No fever or chills.  Patient has history of COPD.  States when she has difficulty breathing she takes a half of Xanax and feels better.  Patient has a lengthy past medical history please see below   Past Medical History:  Diagnosis Date  . Anxiety   . Asthma   . Atypical chest pain    a. 2003 Cath: reportedly nl per pt [name]);  b. 04/2015 [facility] Admission, neg troponin.  . Colon cancer ([place]) 1999   a. 1999: Pt had partial colectomy. Did develop lung metastasis. Had right upper lobectomy in 01/2007 then had associated chemotherapy.  Marland Kitchen COPD (chronic obstructive pulmonary disease) ([place])   . DDD (degenerative disc disease), lumbosacral   . Depression   . Echocardiogram abnormal    a. 02/2010 Echo: EF 55-60%, mild LAE, no regional wall motion abnormalities  . Elevated transaminase level    a. ? NASH;  b. 06/2015 - nl LFTs.  . GERD (gastroesophageal reflux disease)   . Hematuria   . Holter monitor, abnormal    a. 12/2009 Holter monitoring: NSR, rare PACs and PVCs.  Marland Kitchen Hypercholesterolemia   . Hyperlipidemia   . Hypertension   . Leaky heart valve    a. Per pt report - no evidence of valvular abnormalities on prior echoes.  . Lesion of skin of Right breast 10/20/2017  . Lung metastases ([place]) 2008   a. S/P resection (lobectomy - right)  . Morbid obesity ([place])   . Nephrolithiasis   . Parkinson disease ([place]) 08/2016   [name]     Patient Active Problem List   Diagnosis Date Noted  . Constipation 11/11/2019  . Anemia 11/11/2019  . Near syncope 02/13/2019  . Fibromuscular dysplasia ([place]) 02/02/2019  . Abdominal bloating 01/04/2019  . Unsteady gait 12/03/2018  . History of UTI 10/08/2018  . Ear fullness, right 10/08/2018  . B12 deficiency 03/11/2018  . Discharge from right nipple 11/10/2017  . Goals of care, counseling/discussion 10/15/2017  . Parkinson's disease ([place]) 07/31/2017  . Chronic obstructive pulmonary disease ([place]) 07/31/2017  . REM behavioral disorder 05/28/2017  . Elevated liver function tests 08/25/2016  . Carotid artery disease ([place]) 07/19/2016  . Tremor 06/01/2016  . Osteoporosis 04/08/2016  . Foot pain, bilateral 07/24/2015  . Head pain 07/24/2015  . Hyperlipidemia 06/21/2015  . Other fatigue 06/21/2015  . BMI 33.0-33.9,adult 06/21/2015  . Morbid obesity ([place])   . Disorder of kidney and ureter 03/28/2015  . Hyperglycemia 03/20/2015  . Swelling of lower extremity 11/20/2014  . Dysuria 10/24/2014  . Health care maintenance 07/17/2014  . Hypokalemia 04/05/2013  . TIA (transient ischemic attack) 02/09/2013  . Transient cerebral ischemia 02/09/2013  . Anxiety, mild 03/13/2012  . Depression, recurrent ([place]) 03/13/2012  . History of colon cancer 03/13/2012  . Hypertension 03/13/2012  . CKD (chronic kidney disease), stage III ([place]) 03/13/2012  . GERD (gastroesophageal reflux disease) 03/13/2012  .  Acid reflux 03/13/2012  . History of malignant neoplasm metastatic to lung 03/13/2012  . Major depressive disorder with single episode 03/13/2012  . Hypercholesterolemia 02/02/2010  . SOB (shortness of breath) 02/02/2010  . Pure hypercholesterolemia 02/02/2010    Past Surgical History:  Procedure Laterality Date  . BREAST EXCISIONAL BIOPSY Right 10/07/2009   benign  . CARDIAC CATHETERIZATION  2003   negative as per pt report  . CHOLECYSTECTOMY  2000  . COLON SURGERY     Colon  cancer, removed part of large colon  . COLONOSCOPY N/A 04/25/2015   Procedure: COLONOSCOPY;  Surgeon: Manya Silvas, MD;  Location: [facility] ENDOSCOPY;  Service: Endoscopy;  Laterality: N/A;  . COLONOSCOPY WITH PROPOFOL N/A 06/14/2017   Procedure: COLONOSCOPY WITH PROPOFOL;  Surgeon: Manya Silvas, MD;  Location: [facility] ENDOSCOPY;  Service: Endoscopy;  Laterality: N/A;  . COLONOSCOPY WITH PROPOFOL N/A 11/17/2019   Procedure: COLONOSCOPY WITH PROPOFOL;  Surgeon: [name], MD;  Location: [facility] ENDOSCOPY;  Service: Gastroenterology;  Laterality: N/A;  . LOBECTOMY  01/2007   Right lung, upper lobe right side removed  . TOTAL ABDOMINAL HYSTERECTOMY     abnormal bleeding  . TUBAL LIGATION  1980    Prior to Admission medications   Medication Sig Start Date End Date Taking? Authorizing Provider  furosemide (LASIX) 20 MG tablet Take 1 tablet (20 mg total) by mouth daily. 08/18/20  Yes Fisher, Linden Dolin, PA-C  ALPRAZolam Duanne Moron) 0.5 MG tablet Take 1/2 tablet q day prn 03/02/20   Einar Pheasant, MD  amLODipine (NORVASC) 2.5 MG tablet Take 1 tablet (2.5 mg total) by mouth daily. 05/13/20   Einar Pheasant, MD  aspirin EC 81 MG tablet Take 81 mg by mouth as needed.     [provider]  buPROPion (WELLBUTRIN XL) 150 MG 24 hr tablet Take 1 tablet (150 mg total) by mouth daily. 08/12/20   Einar Pheasant, MD  calcium-vitamin D (OSCAL WITH D) 500-200 MG-UNIT tablet Take 1 tablet by mouth.    [provider]  carbidopa-levodopa (SINEMET) 25-100 MG tablet Take 1.5 in am and one at 11:00 and one in afternoon and 1.5 at night. 06/23/19   Einar Pheasant, MD  cyclobenzaprine (FLEXERIL) 10 MG tablet Take 1 tablet (10 mg total) by mouth 3 (three) times daily as needed for muscle spasms. 07/05/20   Edrick Kins, DPM  diclofenac Sodium (VOLTAREN) 1 % GEL Apply 2 g topically 4 (four) times daily. 07/09/20   Nance Pear, MD  ferrous sulfate 325 (65 FE) MG tablet Take 325 mg by mouth daily with breakfast.      [provider]  fluticasone (FLONASE) 50 MCG/ACT nasal spray Place 2 sprays into both nostrils daily. 03/02/20   Einar Pheasant, MD  fluticasone-salmeterol (ADVAIR HFA) 242-35 MCG/ACT inhaler Inhale 2 puffs into the lungs 2 (two) times daily. 10/15/19   Tyler Pita, MD  meloxicam (MOBIC) 15 MG tablet Take 1 tablet (15 mg total) by mouth daily. 05/31/20   Edrick Kins, DPM  montelukast (SINGULAIR) 10 MG tablet Take by mouth. 01/13/20 01/12/21  [provider]  mupirocin ointment (BACTROBAN) 2 % Apply 1 application topically 3 (three) times daily. 01/08/20   McLean-Scocuzza, Nino Glow, MD  omeprazole (PRILOSEC) 20 MG capsule Take 1 capsule (20 mg total) by mouth 2 (two) times daily before a meal. 06/23/19   Einar Pheasant, MD  ondansetron (ZOFRAN ODT) 4 MG disintegrating tablet Allow 1-2 tablets to dissolve in your mouth every 8 hours as  needed for nausea/vomiting 04/20/20   Hinda Kehr, MD  pramipexole (MIRAPEX) 0.25 MG tablet Take by mouth. 04/28/20 05/28/20  [provider]  PROAIR HFA 108 (90 Base) MCG/ACT inhaler INHALE 2 PUFFS BY MOUTH INTO THE LUNGS EVERY 6 HOURS AS NEEDED FOR WHEEZING 11/20/19   Einar Pheasant, MD  rosuvastatin (CRESTOR) 5 MG tablet Take 1 tablet (5 mg total) by mouth every Monday, Wednesday, and Friday. 05/13/20   Einar Pheasant, MD  Spacer/Aero-Holding Josiah Lobo (EASIVENT) inhaler See admin instructions. 01/13/20 01/12/21  [provider]  traZODone (DESYREL) 50 MG tablet TAKE 2 TABLETS BY MOUTH AT BEDTIME 08/15/20   Einar Pheasant, MD  vitamin B-12 (CYANOCOBALAMIN) 100 MCG tablet Take 100 mcg by mouth daily.    [provider]    Allergies Macrobid [nitrofurantoin macrocrystal]; Antihistamines, diphenhydramine-type; Aspirin; Ceftin [cefuroxime axetil]; Celecoxib; Cymbalta [duloxetine hcl]; Diphenhydramine; Escitalopram; Lexapro [escitalopram oxalate]; Metronidazole; Nitrofurantoin; Zoloft [sertraline hcl]; and  Ciprofloxacin  Family History  Problem Relation Age of Onset  . Coronary artery disease Mother        died @ 59 of MI.  . Mental illness Mother   . Diabetes Mother   . Heart attack Father 46       MI  . Cancer Father        lung - died in his mid-70's  . Hypertension Father   . Diabetes Father   . Sudden death Brother   . Mental illness Brother   . Heart attack Brother        MI @ 13, died @ age 31.  . Breast cancer Neg Hx     Social History Social History   Tobacco Use  . Smoking status: Passive Smoke Exposure - Never Smoker  . Smokeless tobacco: Never Used  . Tobacco comment: husband and son smoked in her home.  Vaping Use  . Vaping Use: Never used  Substance Use Topics  . Alcohol use: Not Currently    Alcohol/week: 0.0 standard drinks  . Drug use: No    Review of Systems  Constitutional: No fever/chills Eyes: No visual changes. ENT: No sore throat. Respiratory: Positive cough Cardiovascular: Positive chest pain Gastrointestinal: Denies abdominal pain Genitourinary: Negative for dysuria. Musculoskeletal: Negative for back pain.  Positive for right lower leg swelling Skin: Negative for rash. Psychiatric: no mood changes,     ____________________________________________   PHYSICAL EXAM:  VITAL SIGNS: ED Triage Vitals  Enc Vitals Group     BP 08/18/20 1149 135/64     Pulse Rate 08/18/20 1149 75     Resp 08/18/20 1149 (!) 24     Temp 08/18/20 1149 98 F (36.7 C)     Temp src --      SpO2 08/18/20 1149 100 %     Weight 08/18/20 1214 207 lb 3.7 oz (94 kg)     Height 08/18/20 1214 5\' 3"  (1.6 m)     Head Circumference --      Peak Flow --      Pain Score 08/18/20 1147 5     Pain Loc --      Pain Edu? --      Excl. in [place]? --     Constitutional: Alert and oriented. Well appearing and in no acute distress. Eyes: Conjunctivae are normal.  Head: Atraumatic. Nose: No congestion/rhinnorhea. Mouth/Throat: Mucous membranes are moist.   Neck:  supple  no lymphadenopathy noted Cardiovascular: Normal rate, regular rhythm. Heart sounds are normal Respiratory: Increased respiratory effort.  No retractions, lungs  c t a  Abd: soft nontender bs normal all 4 quad GU: deferred Musculoskeletal: FROM all extremities, warm and well perfused, 2+ pitting edema on the right leg, 1+ on the left Neurologic:  Normal speech and language.  Skin:  Skin is warm, dry and intact. No rash noted. Psychiatric: Mood and affect are normal. Speech and behavior are normal.  ____________________________________________   LABS (all labs ordered are listed, but only abnormal results are displayed)  Labs Reviewed  CBC - Abnormal; Notable for the following components:      Result Value   Hemoglobin 11.9 (*)    All other components within normal limits  BASIC METABOLIC PANEL - Abnormal; Notable for the following components:   Glucose, Bld 133 (*)    Creatinine, Ser 1.16 (*)    GFR, Estimated 51 (*)    All other components within normal limits  BRAIN NATRIURETIC PEPTIDE - Abnormal; Notable for the following components:   B Natriuretic Peptide 116.6 (*)    All other components within normal limits  D-DIMER, QUANTITATIVE - Abnormal; Notable for the following components:   D-Dimer, Quant 1.12 (*)    All other components within normal limits  URINALYSIS, COMPLETE (UACMP) WITH MICROSCOPIC - Abnormal; Notable for the following components:   Color, Urine COLORLESS (*)    APPearance CLEAR (*)    Specific Gravity, Urine 1.002 (*)    Hgb urine dipstick SMALL (*)    All other components within normal limits  HEPATIC FUNCTION PANEL  TROPONIN I (HIGH SENSITIVITY)   ____________________________________________   ____________________________________________  RADIOLOGY  Chest x-ray, ultrasound right lower leg, CT for PE  ____________________________________________   PROCEDURES  Procedure(s) performed: EKG, see physician  read  Procedures    ____________________________________________   INITIAL IMPRESSION / ASSESSMENT AND PLAN / ED COURSE  Pertinent labs & imaging results that were available during my care of the patient were reviewed by me and considered in my medical decision making (see chart for details).   Patient 70 year old female presents with multiple medical complaints.  See HPI.  Physical exam shows patient appears stable at this time.  DDx: MI, dizziness, CHF,  PE , DVT  Chest x-ray reviewed by me and confirmed by radiology shows cardiomegaly but no pneumonia or CHF  Ultrasound right lower extremity does not show DVT  Due to the elevated D-dimer along with the shortness of breath I did order a chest CT for PE  Labs showed normal CBC, basically normal basic metabolic panel, BNP is elevated at 116, D-dimer is elevated at 1.2, troponins normal, urinalysis is normal  CTA for PE is negative for PE, does show some interstitial edema.  Patient is to take the Lasix for a few days to decrease fluid in the legs.  Follow-up with her family physician.  Return emergency department if worsening.  She was discharged in stable condition.     Victoria Hanson was evaluated in Emergency Department on 08/18/2020 for the symptoms described in the history of present illness. She was evaluated in the context of the global COVID-19 pandemic, which necessitated consideration that the patient might be at risk for infection with the SARS-CoV-2 virus that causes COVID-19. Institutional protocols and algorithms that pertain to the evaluation of patients at risk for COVID-19 are in a state of rapid change based on information released by regulatory bodies including the CDC and federal and state organizations. These policies and algorithms were followed during the patient's care in the ED.    As  part of my medical decision making, I reviewed the following data within the [place] History obtained  from family, Nursing notes reviewed and incorporated, Labs reviewed , EKG interpreted NSR, see physician read, Old chart reviewed, Radiograph reviewed , Notes from prior ED visits and [place] Controlled Substance Database  ____________________________________________   FINAL CLINICAL IMPRESSION(S) / ED DIAGNOSES  Final diagnoses:  Acute systolic congestive heart failure (HCC)  Peripheral edema      NEW MEDICATIONS STARTED DURING THIS VISIT:  New Prescriptions   FUROSEMIDE (LASIX) 20 MG TABLET    Take 1 tablet (20 mg total) by mouth daily.     Note:  This document was prepared using Dragon voice recognition software and may include unintentional dictation errors.    Versie Starks, PA-C 08/18/20 1654    Harvest Dark, MD 08/22/20 [phone]

## 2020-08-18 NOTE — ED Notes (Signed)
Urine collected by this EDT at this time.

## 2020-08-18 NOTE — Telephone Encounter (Signed)
Reviewed. Pt in ER now.

## 2020-08-18 NOTE — ED Notes (Signed)
Pt walked to hall bathroom and voided. Pt did not know needed urine sample. Pt back in bed, ultrasound at bedside.

## 2020-08-18 NOTE — Discharge Instructions (Addendum)
Follow-up with your regular doctor.  Please call for appointment. Return emergency department worsening Take the Lasix for 4 to 5 days see if this decreases swelling in your legs.  You can take it as needed.  Consult with your regular physician.

## 2020-08-18 NOTE — Telephone Encounter (Cosign Needed)
The patient called in and I triaged her today.  Patient stated for several days she has had a knot on her RT leg and today it is bright red, Patient stated she has been putting Voltaren Gel on the spot, but now she is having pain when walking.  She stated she is having to use her Walker.  Patient stated she is also getting dizzy upon standing and her has been checking her BP last night her BP was 162-104 and pulse was 68 She stated the knot is under the skin.  Patient was informed that Dr. Nicki Reaper and none of the other provider have any openings and she was advised to go to [facility] ASAP  To be evaluated for a blood clot and because of her BP and she agreed to do that.   Kadeja Granada,cma

## 2020-08-18 NOTE — ED Notes (Signed)
Provider at bedside.  Pt sister at bedside.

## 2020-08-18 NOTE — ED Notes (Signed)
See triage note  Presents with swelling to left lower ext  States she noticed the redness with some swelling couple days ago  Redness noted to anterior lower leg

## 2020-08-18 NOTE — ED Notes (Signed)
Rest of ordered labs drawn from IV that was placed by IV team. Pt given water and warm blanket. Explained to pt that we will still need urine sample once she can void again.

## 2020-08-18 NOTE — ED Triage Notes (Addendum)
Pt comes with c/o left leg pain and swelling. Pt states she noticed it couple days ago. Pt states knot to left leg. Pt states episodes of dizzy headed.  Pt has noticeable swelling and redness to left leg.   Pt is SOB but states she is always SOB. Pt appears to have labored breathing and accessory muscle use.

## 2020-08-19 DIAGNOSIS — M170 Bilateral primary osteoarthritis of knee: Secondary | ICD-10-CM | POA: Diagnosis not present

## 2020-08-23 ENCOUNTER — Telehealth (INDEPENDENT_AMBULATORY_CARE_PROVIDER_SITE_OTHER): Payer: Medicare Other | Admitting: Internal Medicine

## 2020-08-23 DIAGNOSIS — Z8589 Personal history of malignant neoplasm of other organs and systems: Secondary | ICD-10-CM | POA: Diagnosis not present

## 2020-08-23 DIAGNOSIS — N183 Chronic kidney disease, stage 3 unspecified: Secondary | ICD-10-CM | POA: Diagnosis not present

## 2020-08-23 DIAGNOSIS — I517 Cardiomegaly: Secondary | ICD-10-CM

## 2020-08-23 DIAGNOSIS — M7989 Other specified soft tissue disorders: Secondary | ICD-10-CM | POA: Diagnosis not present

## 2020-08-23 DIAGNOSIS — E7800 Pure hypercholesterolemia, unspecified: Secondary | ICD-10-CM | POA: Diagnosis not present

## 2020-08-23 DIAGNOSIS — R0602 Shortness of breath: Secondary | ICD-10-CM | POA: Diagnosis not present

## 2020-08-23 DIAGNOSIS — E782 Mixed hyperlipidemia: Secondary | ICD-10-CM

## 2020-08-23 DIAGNOSIS — F339 Major depressive disorder, recurrent, unspecified: Secondary | ICD-10-CM

## 2020-08-23 DIAGNOSIS — J449 Chronic obstructive pulmonary disease, unspecified: Secondary | ICD-10-CM

## 2020-08-23 DIAGNOSIS — R2681 Unsteadiness on feet: Secondary | ICD-10-CM | POA: Diagnosis not present

## 2020-08-23 DIAGNOSIS — K219 Gastro-esophageal reflux disease without esophagitis: Secondary | ICD-10-CM | POA: Diagnosis not present

## 2020-08-23 DIAGNOSIS — G20 Parkinson's disease: Secondary | ICD-10-CM

## 2020-08-23 DIAGNOSIS — I10 Essential (primary) hypertension: Secondary | ICD-10-CM

## 2020-08-23 DIAGNOSIS — R42 Dizziness and giddiness: Secondary | ICD-10-CM

## 2020-08-23 DIAGNOSIS — D649 Anemia, unspecified: Secondary | ICD-10-CM

## 2020-08-23 NOTE — Progress Notes (Signed)
Patient ID: Victoria Hanson, female   DOB: 12-15-1950, 70 y.o.   MRN: [id number]   Virtual Visit via telephone Note  This visit type was conducted due to national recommendations for restrictions regarding the COVID-19 pandemic (e.g. social distancing).  This format is felt to be most appropriate for this patient at this time.  All issues noted in this document were discussed and addressed.  No physical exam was performed (except for noted visual exam findings with Video Visits).   I connected with Victoria Hanson by telephone and verified that I am speaking with the correct person using two identifiers. Location patient: home Location provider: work Persons participating in the telephone visit: patient, provider  The limitations, risks, security and privacy concerns of performing an evaluation and management service by telephone and the availability of in person appointments have bee discussed. It has also been discussed with the patient that there may be a patient responsible charge related to this service. The patient expressed understanding and agreed to proceed.   Reason for visit: work in appt  HPI: Was seen in ER 08/18/20 with right lower leg pain and swelling.  Also reported at that time - dizziness and some increased sob.  CXR - cardiomegaly - no pneumonia or CHF.  Ultrasound - negative for DVT.  D dimer - elevated.  CT chest - negative for PE, but did report mild cardiomegaly and findings which could be suggestive of mild interstitial edema. Was instructed to take lasix for a few days.  She reports that her swelling is better.  Breathing stable.  Does report some persistent dizziness.  Notices is worse if moves or walks/bends - certain positions.  No headache.  No sinus pressure.  No chest congestion or cough.  Not taking lasix now.  No abdominal pain.  Bowels moving.    ROS: See pertinent positives and negatives per HPI.  Past Medical History:  Diagnosis Date  . Anxiety   . Asthma    . Atypical chest pain    a. 2003 Cath: reportedly nl per pt [name]);  b. 04/2015 [place] Admission, neg troponin.  . Colon cancer ([place]) 1999   a. 1999: Pt had partial colectomy. Did develop lung metastasis. Had right upper lobectomy in 01/2007 then had associated chemotherapy.  Marland Kitchen COPD (chronic obstructive pulmonary disease) ([place])   . DDD (degenerative disc disease), lumbosacral   . Depression   . Echocardiogram abnormal    a. 02/2010 Echo: EF 55-60%, mild LAE, no regional wall motion abnormalities  . Elevated transaminase level    a. ? NASH;  b. 06/2015 - nl LFTs.  . GERD (gastroesophageal reflux disease)   . Hematuria   . Holter monitor, abnormal    a. 12/2009 Holter monitoring: NSR, rare PACs and PVCs.  Marland Kitchen Hypercholesterolemia   . Hyperlipidemia   . Hypertension   . Leaky heart valve    a. Per pt report - no evidence of valvular abnormalities on prior echoes.  . Lesion of skin of Right breast 10/20/2017  . Lung metastases ([place]) 2008   a. S/P resection (lobectomy - right)  . Morbid obesity ([place])   . Nephrolithiasis   . Parkinson disease ([place]) 08/2016   [name]    Past Surgical History:  Procedure Laterality Date  . BREAST EXCISIONAL BIOPSY Right 10/07/2009   benign  . CARDIAC CATHETERIZATION  2003   negative as per pt report  . CHOLECYSTECTOMY  2000  . COLON SURGERY     Colon cancer, removed part  of large colon  . COLONOSCOPY N/A 04/25/2015   Procedure: COLONOSCOPY;  Surgeon: Manya Silvas, MD;  Location: [facility] ENDOSCOPY;  Service: Endoscopy;  Laterality: N/A;  . COLONOSCOPY WITH PROPOFOL N/A 06/14/2017   Procedure: COLONOSCOPY WITH PROPOFOL;  Surgeon: Manya Silvas, MD;  Location: [facility] ENDOSCOPY;  Service: Endoscopy;  Laterality: N/A;  . COLONOSCOPY WITH PROPOFOL N/A 11/17/2019   Procedure: COLONOSCOPY WITH PROPOFOL;  Surgeon: [name], MD;  Location[facility] ENDOSCOPY;  Service: Gastroenterology;  Laterality: N/A;  . LOBECTOMY  01/2007   Right lung, upper lobe right  side removed  . TOTAL ABDOMINAL HYSTERECTOMY     abnormal bleeding  . TUBAL LIGATION  1980    Family History  Problem Relation Age of Onset  . Coronary artery disease Mother        died @ 10 of MI.  . Mental illness Mother   . Diabetes Mother   . Heart attack Father 71       MI  . Cancer Father        lung - died in his mid-70's  . Hypertension Father   . Diabetes Father   . Sudden death Brother   . Mental illness Brother   . Heart attack Brother        MI @ 64, died @ age 49.  . Breast cancer Neg Hx     SOCIAL HX: reviewed.    Current Outpatient Medications:  .  ALPRAZolam (XANAX) 0.5 MG tablet, Take 1/2 tablet q day prn, Disp: 30 tablet, Rfl: 0 .  amLODipine (NORVASC) 2.5 MG tablet, Take 1 tablet (2.5 mg total) by mouth daily., Disp: 90 tablet, Rfl: 1 .  aspirin EC 81 MG tablet, Take 81 mg by mouth as needed. , Disp: , Rfl:  .  buPROPion (WELLBUTRIN XL) 150 MG 24 hr tablet, Take 1 tablet (150 mg total) by mouth daily., Disp: 30 tablet, Rfl: 1 .  calcium-vitamin D (OSCAL WITH D) 500-200 MG-UNIT tablet, Take 1 tablet by mouth., Disp: , Rfl:  .  carbidopa-levodopa (SINEMET) 25-100 MG tablet, Take 1.5 in am and one at 11:00 and one in afternoon and 1.5 at night., Disp: 150 tablet, Rfl: 0 .  cyclobenzaprine (FLEXERIL) 10 MG tablet, Take 1 tablet (10 mg total) by mouth 3 (three) times daily as needed for muscle spasms., Disp: 90 tablet, Rfl: 0 .  diclofenac Sodium (VOLTAREN) 1 % GEL, Apply 2 g topically 4 (four) times daily., Disp: 50 g, Rfl: 0 .  ferrous sulfate 325 (65 FE) MG tablet, Take 325 mg by mouth daily with breakfast. , Disp: , Rfl:  .  fluticasone (FLONASE) 50 MCG/ACT nasal spray, Place 2 sprays into both nostrils daily., Disp: 16 g, Rfl: 6 .  fluticasone-salmeterol (ADVAIR HFA) 115-21 MCG/ACT inhaler, Inhale 2 puffs into the lungs 2 (two) times daily., Disp: 1 Inhaler, Rfl: 4 .  furosemide (LASIX) 20 MG tablet, Take 1 tablet (20 mg total) by mouth daily., Disp: 30  tablet, Rfl: 0 .  meloxicam (MOBIC) 15 MG tablet, Take 1 tablet (15 mg total) by mouth daily., Disp: 30 tablet, Rfl: 1 .  montelukast (SINGULAIR) 10 MG tablet, Take by mouth., Disp: , Rfl:  .  mupirocin ointment (BACTROBAN) 2 %, Apply 1 application topically 3 (three) times daily., Disp: 30 g, Rfl: 0 .  omeprazole (PRILOSEC) 20 MG capsule, Take 1 capsule (20 mg total) by mouth 2 (two) times daily before a meal., Disp: 180 capsule, Rfl: 1 .  ondansetron (ZOFRAN  ODT) 4 MG disintegrating tablet, Allow 1-2 tablets to dissolve in your mouth every 8 hours as needed for nausea/vomiting, Disp: 30 tablet, Rfl: 0 .  PROAIR HFA 108 (90 Base) MCG/ACT inhaler, INHALE 2 PUFFS BY MOUTH INTO THE LUNGS EVERY 6 HOURS AS NEEDED FOR WHEEZING, Disp: 8.5 g, Rfl: 2 .  rosuvastatin (CRESTOR) 5 MG tablet, Take 1 tablet (5 mg total) by mouth every Monday, Wednesday, and Friday., Disp: 90 tablet, Rfl: 1 .  Spacer/Aero-Holding Chambers (EASIVENT) inhaler, See admin instructions., Disp: , Rfl:  .  traZODone (DESYREL) 50 MG tablet, TAKE 2 TABLETS BY MOUTH AT BEDTIME, Disp: 180 tablet, Rfl: 1 .  vitamin B-12 (CYANOCOBALAMIN) 100 MCG tablet, Take 100 mcg by mouth daily., Disp: , Rfl:  .  pramipexole (MIRAPEX) 0.25 MG tablet, Take by mouth., Disp: , Rfl:   EXAM:  GENERAL: alert. Sounds to be in no acute distress.  Answering questions appropriately.   PSYCH/NEURO: pleasant and cooperative, no obvious depression or anxiety, speech and thought processing grossly intact  ASSESSMENT AND PLAN:  Discussed the following assessment and plan:  Problem List Items Addressed This Visit    Anemia    hgb 11.9  - 08/18/20.        Cardiomegaly    Cardiomegaly noted on CT scan.  Changes felt to be c/w mild interstitial edema.  She took lasix for a few days.  Breathing overall stable now.  Given cardiomegaly and mild edema, discussed further cardiac w/up - question of need for f/u ECHO, etc.  Discussed weighing herself daily and lasix prn  weight gain - parameters given.        Relevant Orders   Ambulatory referral to Cardiology   Chronic obstructive pulmonary disease ([place])    Continue advair.       CKD (chronic kidney disease), stage III ([place])    Stay hydrated.  Avoid antiinflammatories.  Follow metabolic panel.       Depression, recurrent ([place])    Off cymbalta. On wellbutrin now.  Feels is tolerating and helping.  Follow.       Dizziness    Previous unsteady gait as outlined.  Followed by neurology for parkinsons.  Describes dizziness now.  Persistent.  Worse with position changes and movements.  Recent MRI - no acute abnormality.  Refer to ENT for further evaluation.       Relevant Orders   Ambulatory referral to ENT   GERD (gastroesophageal reflux disease)    Controlled on current medication regimen.       History of malignant neoplasm metastatic to lung    S/p lobectomy.  Has been evaluated by pulmonary.  CT chest as outlined.       Hyperlipidemia    On crestor.  Low cholesterol diet and exercise.  Follow lipid panel and liver function tests.        Hypertension    Blood pressure has been under good control.  Continue amlodipine.        Parkinson's disease ([place])    Followed by neurology.  On sinemet.        Pure hypercholesterolemia    Continue crestor.  Low cholesterol diet and exercise.  Follow lipid panel and liver function tests.       SOB (shortness of breath)    History of sob.  See last note.  No increased cough or congestion.  CT chest - cardiomegaly and findings which could be suggestive of mild interstitial edema.  Took lasix for a few days.  Feels better.  Discussed weight herself daily with parameters to use lasix prn.  Refer to cardiology for further evaluation and question of need for further cardiac w/up, including ECHO, etc.        Relevant Orders   Ambulatory referral to Cardiology   Swelling of lower extremity    Recently evaluated in ER as outlined.  Took lasix for a few  days.  Swelling is better.  Follow.  Discussed weighing herself daily.  Can use lasix prn - weight gain as discussed.        Relevant Orders   Ambulatory referral to Cardiology   Unsteady gait    Reported dizziness/unsteadiness.  Recent MRI 02/2020 - no acute abnormality.  On questioning her, she reports more dizziness now.  Is positional. Discussed further w/up.  Given worse with position changes, etc - will have ENT evaluate.  Seeing neurology for f/u Parkinsons and unsteady gait.           I discussed the assessment and treatment plan with the patient. The patient was provided an opportunity to ask questions and all were answered. The patient agreed with the plan and demonstrated an understanding of the instructions.   The patient was advised to call back or seek an in-person evaluation if the symptoms worsen or if the condition fails to improve as anticipated.  I provided 25 minutes of non-face-to-face time during this encounter.   Einar Pheasant, MD

## 2020-08-24 ENCOUNTER — Encounter: Payer: Self-pay | Admitting: Internal Medicine

## 2020-08-24 ENCOUNTER — Telehealth: Payer: Self-pay | Admitting: Internal Medicine

## 2020-08-24 DIAGNOSIS — I517 Cardiomegaly: Secondary | ICD-10-CM | POA: Insufficient documentation

## 2020-08-24 DIAGNOSIS — R42 Dizziness and giddiness: Secondary | ICD-10-CM | POA: Insufficient documentation

## 2020-08-24 NOTE — Assessment & Plan Note (Signed)
Controlled on current medication regimen.

## 2020-08-24 NOTE — Assessment & Plan Note (Signed)
Off cymbalta. On wellbutrin now.  Feels is tolerating and helping.  Follow.

## 2020-08-24 NOTE — Assessment & Plan Note (Signed)
S/p lobectomy.  Has been evaluated by pulmonary.  CT chest as outlined.

## 2020-08-24 NOTE — Assessment & Plan Note (Signed)
History of sob.  See last note.  No increased cough or congestion.  CT chest - cardiomegaly and findings which could be suggestive of mild interstitial edema.  Took lasix for a few days.  Feels better.  Discussed weight herself daily with parameters to use lasix prn.  Refer to cardiology for further evaluation and question of need for further cardiac w/up, including ECHO, etc.

## 2020-08-24 NOTE — Assessment & Plan Note (Signed)
Blood pressure has been under good control.  Continue amlodipine.

## 2020-08-24 NOTE — Assessment & Plan Note (Signed)
On crestor.  Low cholesterol diet and exercise.  Follow lipid panel and liver function tests.   

## 2020-08-24 NOTE — Assessment & Plan Note (Signed)
Cardiomegaly noted on CT scan.  Changes felt to be c/w mild interstitial edema.  She took lasix for a few days.  Breathing overall stable now.  Given cardiomegaly and mild edema, discussed further cardiac w/up - question of need for f/u ECHO, etc.  Discussed weighing herself daily and lasix prn weight gain - parameters given.

## 2020-08-24 NOTE — Assessment & Plan Note (Signed)
Continue crestor.  Low cholesterol diet and exercise. Follow lipid panel and liver function tests.   

## 2020-08-24 NOTE — Assessment & Plan Note (Signed)
Continue advair

## 2020-08-24 NOTE — Assessment & Plan Note (Signed)
Stay hydrated.  Avoid antiinflammatories.  Follow metabolic panel.

## 2020-08-24 NOTE — Assessment & Plan Note (Signed)
Previous unsteady gait as outlined.  Followed by neurology for parkinsons.  Describes dizziness now.  Persistent.  Worse with position changes and movements.  Recent MRI - no acute abnormality.  Refer to ENT for further evaluation.

## 2020-08-24 NOTE — Assessment & Plan Note (Signed)
hgb 11.9  - 08/18/20.

## 2020-08-24 NOTE — Assessment & Plan Note (Signed)
Followed by neurology.  On sinemet.

## 2020-08-24 NOTE — Assessment & Plan Note (Addendum)
Reported dizziness/unsteadiness.  Recent MRI 02/2020 - no acute abnormality.  On questioning her, she reports more dizziness now.  Is positional. Discussed further w/up.  Given worse with position changes, etc - will have ENT evaluate.  Seeing neurology for f/u Parkinsons and unsteady gait.

## 2020-08-24 NOTE — Assessment & Plan Note (Signed)
Recently evaluated in ER as outlined.  Took lasix for a few days.  Swelling is better.  Follow.  Discussed weighing herself daily.  Can use lasix prn - weight gain as discussed.

## 2020-08-24 NOTE — Telephone Encounter (Signed)
Patient called and wanted to know if a referral to Dr. Pryor Ochoa, ENT was done. Please call patient. She would like to see ENT asap.

## 2020-08-25 NOTE — Telephone Encounter (Signed)
Called patient. Unable to leave message.

## 2020-08-26 ENCOUNTER — Telehealth: Payer: Self-pay | Admitting: Internal Medicine

## 2020-08-26 MED ORDER — CITALOPRAM HYDROBROMIDE 10 MG PO TABS: 10.0000 mg | ORAL_TABLET | Freq: Every day | ORAL | 1 refills | Status: DC

## 2020-08-26 NOTE — Telephone Encounter (Signed)
Per Azzel, she has celexa at home.  She can start the celexa 10mg  one per day.   Confirm she has been on wellbutrin 150mg  q day.  If this is correct, then haave her decrease the wellbutrin to qod x 1 week and then stop.

## 2020-08-26 NOTE — Telephone Encounter (Signed)
Called and spoke to [place]. Lisl states that she does not think that the wellbutrin will work out and that she wants to stay on the Celexa that she has been on. She states that her Celexa is at 10mg . She has already called [facility] office and has the appointment date and time written down.

## 2020-08-26 NOTE — Telephone Encounter (Signed)
rx sent in for citalopram.  Pt has taken and tolerated.

## 2020-08-26 NOTE — Telephone Encounter (Signed)
Spoken to patient, she will need a refill of her Celexa. She stated she will comply with directions for stopping Wellbutrin.

## 2020-08-26 NOTE — Telephone Encounter (Signed)
patient called in stated she would like to stop buPROPion (WELLBUTRIN XL) 150 MG 24 hr tablet  and go back to her celexa, she also wanted to know the name of cardio doctor that Dr.Scott referred her to

## 2020-08-26 NOTE — Telephone Encounter (Signed)
Why does she want to come off wellbutrin.  She had reported she was doing well on the medication.  If she is going to change, does she have celexa at home.  Confirm dose.  Also, she has been scheduled with cardiology in [facility].

## 2020-08-29 NOTE — Telephone Encounter (Signed)
Patient aware and is scheduled with them for April 5

## 2020-08-31 DIAGNOSIS — R0600 Dyspnea, unspecified: Secondary | ICD-10-CM | POA: Diagnosis not present

## 2020-08-31 DIAGNOSIS — J439 Emphysema, unspecified: Secondary | ICD-10-CM | POA: Diagnosis not present

## 2020-08-31 DIAGNOSIS — R600 Localized edema: Secondary | ICD-10-CM | POA: Diagnosis not present

## 2020-09-05 ENCOUNTER — Other Ambulatory Visit: Payer: Self-pay | Admitting: Internal Medicine

## 2020-09-05 NOTE — Telephone Encounter (Signed)
rx sent in for xanax #15 no refills.

## 2020-09-10 IMAGING — US US RENAL
1 series · 14 of 25 positions shown · non-contrast
Comparison: None.

CLINICAL DATA: Chronic renal disease.

EXAM:
RENAL / URINARY TRACT ULTRASOUND COMPLETE

[Series 1: us renal · 0.26mm/px · 14 of 31 slices shown]
[im 1/31]
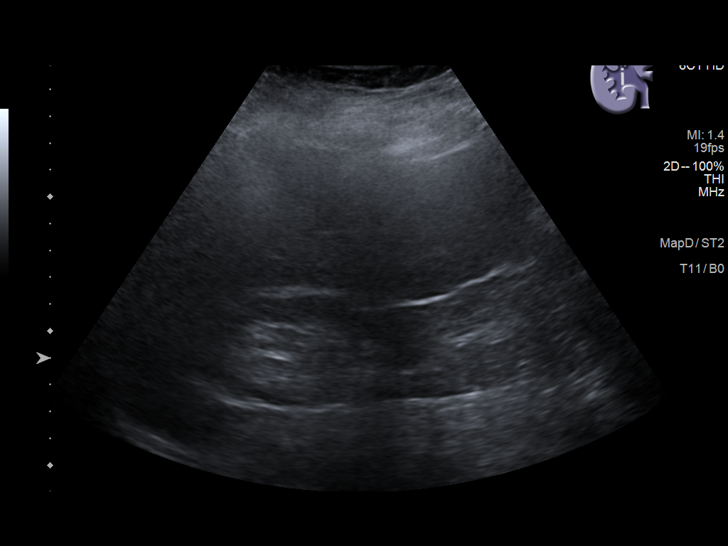
[im 3/31]
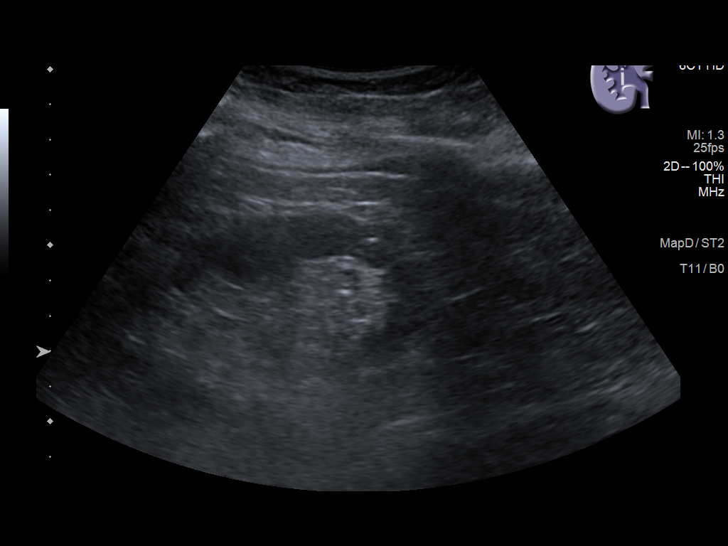
[im 6/31]
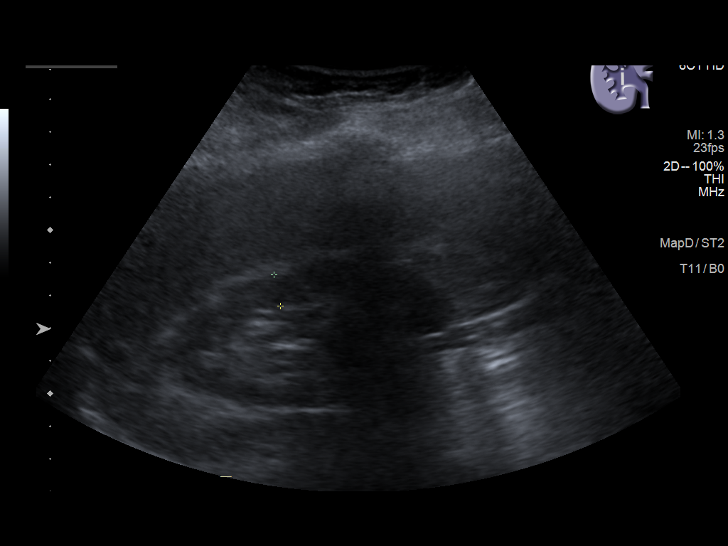
[im 8/31]
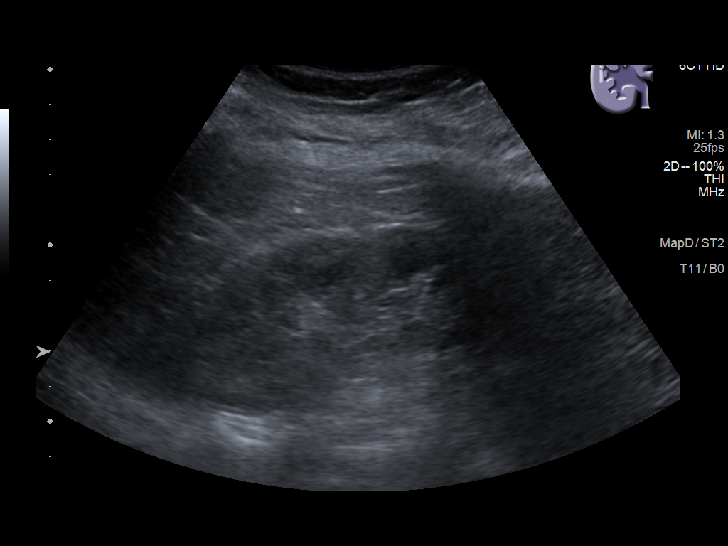
[im 11/31]
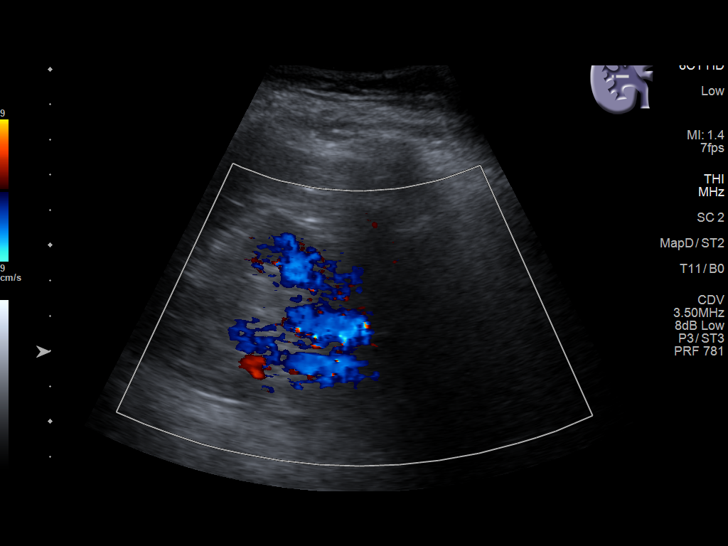
[im 12/31]
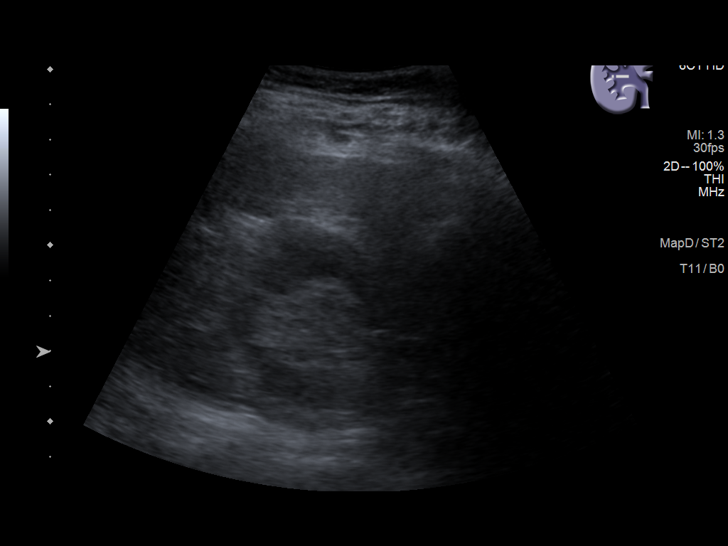
[im 14/31]
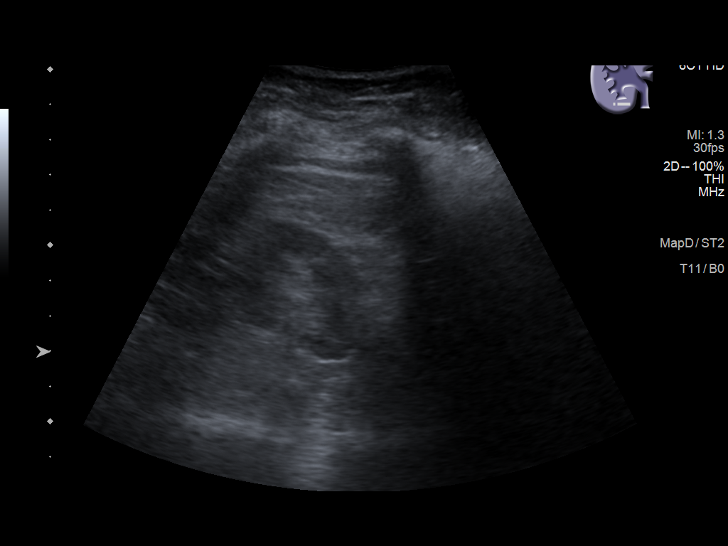
[im 17/31]
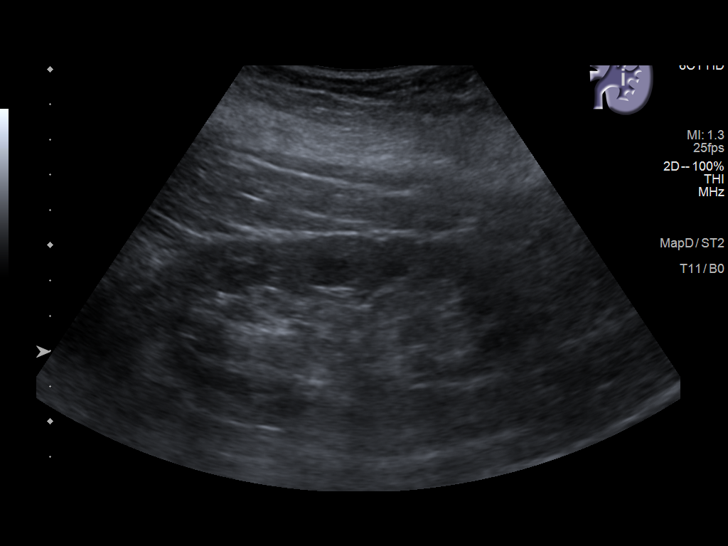
[im 19/31]
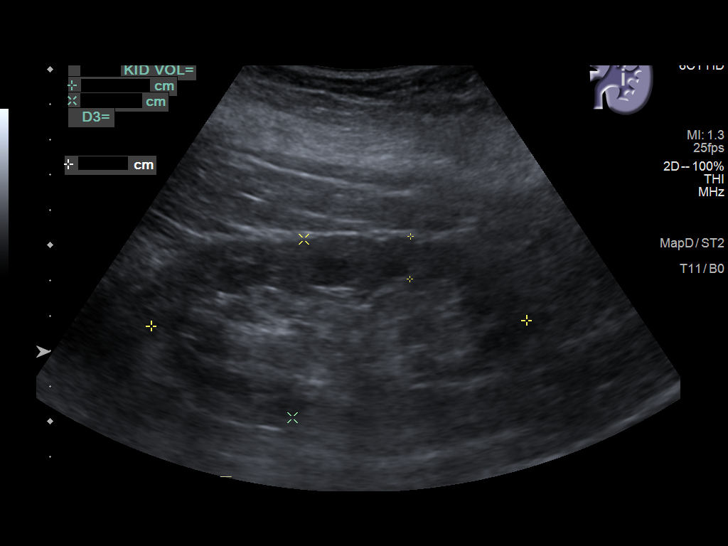
[im 21/31]
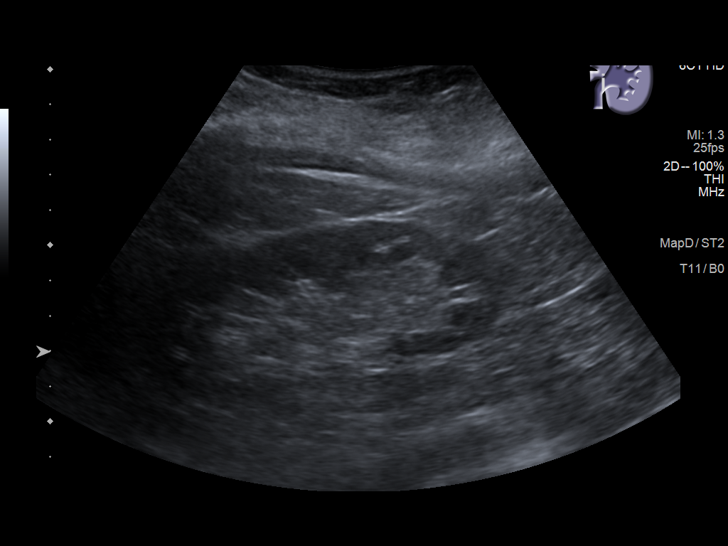
[im 23/31]
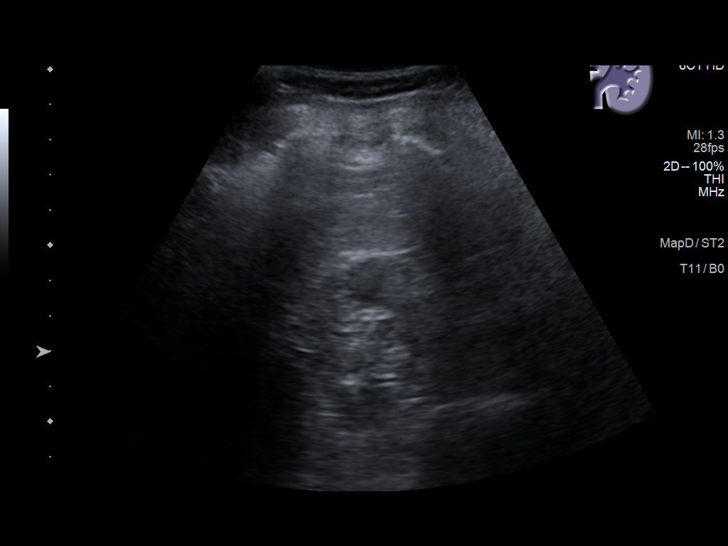
[im 26/31]
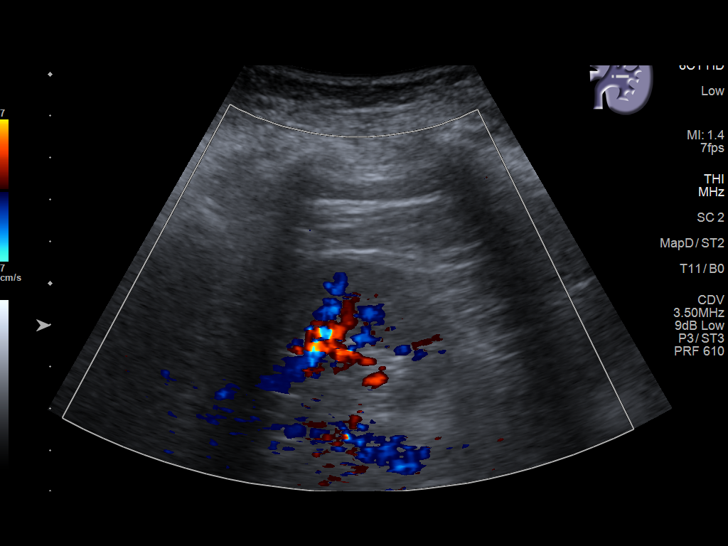
[im 28/31]
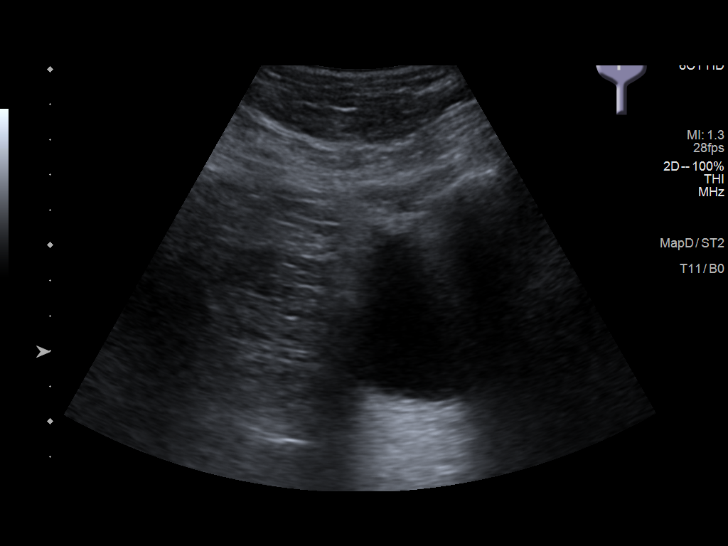
[im 31/31]
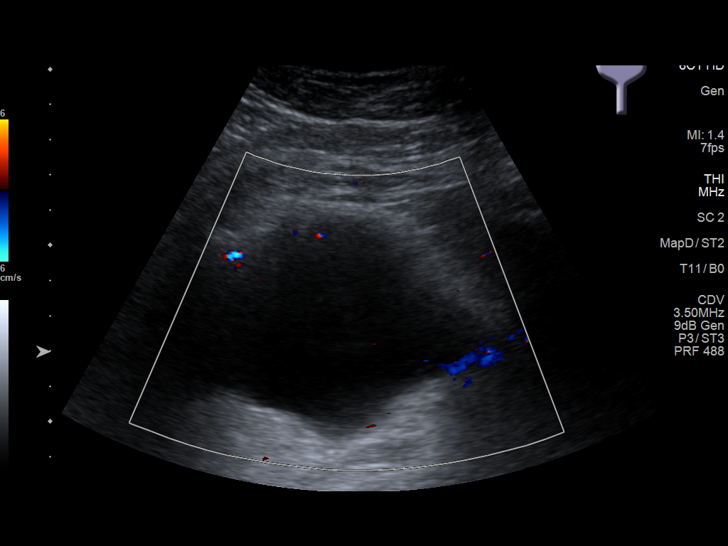

[14 of 25 positions shown; findings below may reference images not displayed]

FINDINGS: Right Kidney:

Renal measurements: 9.9 x 4.9 x 4.3 cm = volume: 109 mL .
Echogenicity within normal limits. No mass or hydronephrosis
visualized.

Left Kidney:

Renal measurements: 10.7 x 5.1 x 4.3 cm = volume: 123 mL.
Echogenicity within normal limits. No mass or hydronephrosis
visualized.

Bladder:

Appears normal for degree of bladder distention.
IMPRESSION: No acute abnormalities. Cortical thickness measures 1 cm on the
right and 1.2 cm on the left.

## 2020-09-13 DIAGNOSIS — R42 Dizziness and giddiness: Secondary | ICD-10-CM | POA: Diagnosis not present

## 2020-09-14 DIAGNOSIS — M1711 Unilateral primary osteoarthritis, right knee: Secondary | ICD-10-CM | POA: Diagnosis not present

## 2020-09-19 ENCOUNTER — Other Ambulatory Visit: Payer: Self-pay | Admitting: Internal Medicine

## 2020-09-19 ENCOUNTER — Telehealth: Payer: Self-pay | Admitting: Internal Medicine

## 2020-09-19 ENCOUNTER — Emergency Department
Admission: EM | Admit: 2020-09-19 | Discharge: 2020-09-19 | Disposition: A | Discharge status: Home / Self Care | Payer: Medicare Other | Attending: Emergency Medicine | Admitting: Emergency Medicine

## 2020-09-19 ENCOUNTER — Other Ambulatory Visit: Payer: Self-pay

## 2020-09-19 ENCOUNTER — Emergency Department: Payer: Medicare Other

## 2020-09-19 ENCOUNTER — Encounter: Payer: Self-pay | Admitting: Emergency Medicine

## 2020-09-19 DIAGNOSIS — H8392 Unspecified disease of left inner ear: Secondary | ICD-10-CM

## 2020-09-19 DIAGNOSIS — H838X2 Other specified diseases of left inner ear: Secondary | ICD-10-CM | POA: Insufficient documentation

## 2020-09-19 DIAGNOSIS — J45909 Unspecified asthma, uncomplicated: Secondary | ICD-10-CM | POA: Diagnosis not present

## 2020-09-19 DIAGNOSIS — G20 Parkinson's disease: Secondary | ICD-10-CM | POA: Insufficient documentation

## 2020-09-19 DIAGNOSIS — Z7722 Contact with and (suspected) exposure to environmental tobacco smoke (acute) (chronic): Secondary | ICD-10-CM | POA: Diagnosis not present

## 2020-09-19 DIAGNOSIS — I129 Hypertensive chronic kidney disease with stage 1 through stage 4 chronic kidney disease, or unspecified chronic kidney disease: Secondary | ICD-10-CM | POA: Diagnosis not present

## 2020-09-19 DIAGNOSIS — R519 Headache, unspecified: Secondary | ICD-10-CM | POA: Diagnosis not present

## 2020-09-19 DIAGNOSIS — Z8673 Personal history of transient ischemic attack (TIA), and cerebral infarction without residual deficits: Secondary | ICD-10-CM | POA: Insufficient documentation

## 2020-09-19 DIAGNOSIS — R42 Dizziness and giddiness: Secondary | ICD-10-CM | POA: Diagnosis not present

## 2020-09-19 DIAGNOSIS — Z7982 Long term (current) use of aspirin: Secondary | ICD-10-CM | POA: Diagnosis not present

## 2020-09-19 DIAGNOSIS — Z79899 Other long term (current) drug therapy: Secondary | ICD-10-CM | POA: Insufficient documentation

## 2020-09-19 DIAGNOSIS — R0602 Shortness of breath: Secondary | ICD-10-CM | POA: Insufficient documentation

## 2020-09-19 DIAGNOSIS — Z85118 Personal history of other malignant neoplasm of bronchus and lung: Secondary | ICD-10-CM | POA: Insufficient documentation

## 2020-09-19 DIAGNOSIS — H6502 Acute serous otitis media, left ear: Secondary | ICD-10-CM | POA: Diagnosis not present

## 2020-09-19 DIAGNOSIS — Z85038 Personal history of other malignant neoplasm of large intestine: Secondary | ICD-10-CM | POA: Insufficient documentation

## 2020-09-19 DIAGNOSIS — N183 Chronic kidney disease, stage 3 unspecified: Secondary | ICD-10-CM | POA: Diagnosis not present

## 2020-09-19 DIAGNOSIS — J449 Chronic obstructive pulmonary disease, unspecified: Secondary | ICD-10-CM | POA: Diagnosis not present

## 2020-09-19 DIAGNOSIS — H6592 Unspecified nonsuppurative otitis media, left ear: Secondary | ICD-10-CM | POA: Diagnosis not present

## 2020-09-19 LAB — CBC
HCT: 35.8 % — ABNORMAL LOW (ref 36.0–46.0)
Hemoglobin: 11.4 g/dL — ABNORMAL LOW (ref 12.0–15.0)
MCH: 29 pg (ref 26.0–34.0)
MCHC: 31.8 g/dL (ref 30.0–36.0)
MCV: 91.1 fL (ref 80.0–100.0)
Platelets: 270 10*3/uL (ref 150–400)
RBC: 3.93 MIL/uL (ref 3.87–5.11)
RDW: 13.4 % (ref 11.5–15.5)
WBC: 11.5 10*3/uL — ABNORMAL HIGH (ref 4.0–10.5)
nRBC: 0 % (ref 0.0–0.2)

## 2020-09-19 LAB — HEPATIC FUNCTION PANEL
ALT: <5 U/L (ref 0–44)
AST: 21 U/L (ref 15–41)
Albumin: 3.6 g/dL (ref 3.5–5.0)
Alkaline Phosphatase: 66 U/L (ref 38–126)
Bilirubin, Direct: 0.2 mg/dL (ref 0.0–0.2)
Indirect Bilirubin: 0.4 mg/dL (ref 0.3–0.9)
Total Bilirubin: 0.6 mg/dL (ref 0.3–1.2)
Total Protein: 6.8 g/dL (ref 6.5–8.1)

## 2020-09-19 LAB — BASIC METABOLIC PANEL WITH GFR
Anion gap: 10 (ref 5–15)
BUN: 17 mg/dL (ref 8–23)
CO2: 26 mmol/L (ref 22–32)
Calcium: 9 mg/dL (ref 8.9–10.3)
Chloride: 103 mmol/L (ref 98–111)
Creatinine, Ser: 1.09 mg/dL — ABNORMAL HIGH (ref 0.44–1.00)
GFR, Estimated: 55 mL/min — ABNORMAL LOW
Glucose, Bld: 145 mg/dL — ABNORMAL HIGH (ref 70–99)
Potassium: 3.8 mmol/L (ref 3.5–5.1)
Sodium: 139 mmol/L (ref 135–145)

## 2020-09-19 LAB — TROPONIN I (HIGH SENSITIVITY): Troponin I (High Sensitivity): 5 ng/L

## 2020-09-19 LAB — BRAIN NATRIURETIC PEPTIDE: B Natriuretic Peptide: 171.9 pg/mL — ABNORMAL HIGH (ref 0.0–100.0)

## 2020-09-19 MED ORDER — METHYLPREDNISOLONE SODIUM SUCC 125 MG IJ SOLR: 60.0000 mg | Freq: Once | INTRAMUSCULAR | Status: AC

## 2020-09-19 MED ORDER — FLUTICASONE PROPIONATE 50 MCG/ACT NA SUSP: 2.0000 | Freq: Every day | NASAL | 0 refills | Status: DC

## 2020-09-19 MED ORDER — PREDNISONE 20 MG PO TABS: 40.0000 mg | ORAL_TABLET | Freq: Every day | ORAL | 0 refills | Status: DC

## 2020-09-19 MED ORDER — AMOXICILLIN-POT CLAVULANATE 875-125 MG PO TABS: 1.0000 | ORAL_TABLET | Freq: Two times a day (BID) | ORAL | 0 refills | Status: AC

## 2020-09-19 MED ORDER — METHYLPREDNISOLONE SODIUM SUCC 125 MG IJ SOLR
INTRAMUSCULAR | Status: AC
  Administered 2020-09-19: 60 mg via INTRAVENOUS
  Filled 2020-09-19: qty 2

## 2020-09-19 NOTE — ED Provider Notes (Signed)
[facility] Emergency Department Provider Note  ____________________________________________   Event Date/Time   First MD Initiated Contact with Patient 09/19/20 1356     (approximate)  I have reviewed the triage vital signs and the nursing notes.   HISTORY  Chief Complaint Shortness of Breath    HPI Victoria Hanson is a 70 y.o. female here with multiple complaints.  The patient's primary complaint is intermittent dizziness and sensation of possible problems.  This is been a recurrent and ongoing issue, for which she recently saw ENT and is scheduled to get a tilt table test.  She reports that when she moves her head or body, she begins to feel like she is unsteady.  She occasionally has some room spinning but it is more often loss of balance.  The symptoms resolved when she is still.  She saw ENT and was told she may have an inner ear issue and is scheduled for a tilt test.  For the tilt test, she was told to stop her trazodone and Xanax.  She states that since then, she has had increasing anxiety as well as increasing shortness of breath.  She admits that her shortness of breath is often anxiety related and feels like this could be exacerbated by stopping her Xanax.  No increased cough.  No chest pain.  No headaches.  No focal numbness or weakness.  No other complaints.        Past Medical History:  Diagnosis Date  . Anxiety   . Asthma   . Atypical chest pain    a. 2003 Cath: reportedly nl per pt Victoria Hanson);  b. 04/2015 [place] Admission, neg troponin.  . Colon cancer ([place]) 1999   a. 1999: Pt had partial colectomy. Did develop lung metastasis. Had right upper lobectomy in 01/2007 then had associated chemotherapy.  Marland Kitchen COPD (chronic obstructive pulmonary disease) ([place])   . DDD (degenerative disc disease), lumbosacral   . Depression   . Echocardiogram abnormal    a. 02/2010 Echo: EF 55-60%, mild LAE, no regional wall motion abnormalities  . Elevated  transaminase level    a. ? NASH;  b. 06/2015 - nl LFTs.  . GERD (gastroesophageal reflux disease)   . Hematuria   . Holter monitor, abnormal    a. 12/2009 Holter monitoring: NSR, rare PACs and PVCs.  Marland Kitchen Hypercholesterolemia   . Hyperlipidemia   . Hypertension   . Leaky heart valve    a. Per pt report - no evidence of valvular abnormalities on prior echoes.  . Lesion of skin of Right breast 10/20/2017  . Lung metastases ([place]) 2008   a. S/P resection (lobectomy - right)  . Morbid obesity ([place])   . Nephrolithiasis   . Parkinson disease ([place]) 08/2016   [name]    Patient Active Problem List   Diagnosis Date Noted  . Dizziness 08/24/2020  . Cardiomegaly 08/24/2020  . Constipation 11/11/2019  . Anemia 11/11/2019  . Near syncope 02/13/2019  . Fibromuscular dysplasia ([place]) 02/02/2019  . Abdominal bloating 01/04/2019  . Unsteady gait 12/03/2018  . History of UTI 10/08/2018  . Ear fullness, right 10/08/2018  . B12 deficiency 03/11/2018  . Discharge from right nipple 11/10/2017  . Goals of care, counseling/discussion 10/15/2017  . Parkinson's disease ([place]) 07/31/2017  . Chronic obstructive pulmonary disease ([place]) 07/31/2017  . REM behavioral disorder 05/28/2017  . Elevated liver function tests 08/25/2016  . Carotid artery disease ([place]) 07/19/2016  . Tremor 06/01/2016  . Osteoporosis 04/08/2016  .  Foot pain, bilateral 07/24/2015  . Head pain 07/24/2015  . Hyperlipidemia 06/21/2015  . Other fatigue 06/21/2015  . BMI 33.0-33.9,adult 06/21/2015  . Morbid obesity ([place])   . Disorder of kidney and ureter 03/28/2015  . Hyperglycemia 03/20/2015  . Swelling of lower extremity 11/20/2014  . Dysuria 10/24/2014  . Health care maintenance 07/17/2014  . Hypokalemia 04/05/2013  . TIA (transient ischemic attack) 02/09/2013  . Transient cerebral ischemia 02/09/2013  . Anxiety, mild 03/13/2012  . Depression, recurrent ([place]) 03/13/2012  . History of colon cancer 03/13/2012  . Hypertension  03/13/2012  . CKD (chronic kidney disease), stage III ([place]) 03/13/2012  . GERD (gastroesophageal reflux disease) 03/13/2012  . Acid reflux 03/13/2012  . History of malignant neoplasm metastatic to lung 03/13/2012  . Major depressive disorder with single episode 03/13/2012  . Hypercholesterolemia 02/02/2010  . SOB (shortness of breath) 02/02/2010  . Pure hypercholesterolemia 02/02/2010    Past Surgical History:  Procedure Laterality Date  . BREAST EXCISIONAL BIOPSY Right 10/07/2009   benign  . CARDIAC CATHETERIZATION  2003   negative as per pt report  . CHOLECYSTECTOMY  2000  . COLON SURGERY     Colon cancer, removed part of large colon  . COLONOSCOPY N/A 04/25/2015   Procedure: COLONOSCOPY;  Surgeon: Manya Silvas, MD;  Location: [facility] ENDOSCOPY;  Service: Endoscopy;  Laterality: N/A;  . COLONOSCOPY WITH PROPOFOL N/A 06/14/2017   Procedure: COLONOSCOPY WITH PROPOFOL;  Surgeon: Manya Silvas, MD;  Location: [facility] ENDOSCOPY;  Service: Endoscopy;  Laterality: N/A;  . COLONOSCOPY WITH PROPOFOL N/A 11/17/2019   Procedure: COLONOSCOPY WITH PROPOFOL;  Surgeon: [name], MD;  Location: [facility] ENDOSCOPY;  Service: Gastroenterology;  Laterality: N/A;  . LOBECTOMY  01/2007   Right lung, upper lobe right side removed  . TOTAL ABDOMINAL HYSTERECTOMY     abnormal bleeding  . TUBAL LIGATION  1980    Prior to Admission medications   Medication Sig Start Date End Date Taking? Authorizing Provider  amoxicillin-clavulanate (AUGMENTIN) 875-125 MG tablet Take 1 tablet by mouth 2 (two) times daily for 7 days. 09/19/20 09/26/20 Yes Duffy Bruce, MD  fluticasone (FLONASE) 50 MCG/ACT nasal spray Place 2 sprays into both nostrils daily. 09/19/20 10/19/20 Yes Duffy Bruce, MD  predniSONE (DELTASONE) 20 MG tablet Take 2 tablets (40 mg total) by mouth daily for 3 days. 09/19/20 09/22/20 Yes Duffy Bruce, MD  ALPRAZolam Duanne Moron) 0.5 MG tablet TAKE 1/2 TABLET BY MOUTH DAILY AS NEEDED 09/05/20   Einar Pheasant, MD  amLODipine (NORVASC) 2.5 MG tablet Take 1 tablet (2.5 mg total) by mouth daily. 05/13/20   Einar Pheasant, MD  aspirin EC 81 MG tablet Take 81 mg by mouth as needed.     [provider]  buPROPion (WELLBUTRIN XL) 150 MG 24 hr tablet Take 1 tablet (150 mg total) by mouth daily. 08/12/20   Einar Pheasant, MD  calcium-vitamin D (OSCAL WITH D) 500-200 MG-UNIT tablet Take 1 tablet by mouth.    [provider]  carbidopa-levodopa (SINEMET) 25-100 MG tablet Take 1.5 in am and one at 11:00 and one in afternoon and 1.5 at night. 06/23/19   Einar Pheasant, MD  citalopram (CELEXA) 10 MG tablet Take 1 tablet (10 mg total) by mouth daily. 08/26/20   Einar Pheasant, MD  cyclobenzaprine (FLEXERIL) 10 MG tablet Take 1 tablet (10 mg total) by mouth 3 (three) times daily as needed for muscle spasms. 07/05/20   Edrick Kins, DPM  diclofenac Sodium (VOLTAREN) 1 % GEL Apply  2 g topically 4 (four) times daily. 07/09/20   Nance Pear, MD  ferrous sulfate 325 (65 FE) MG tablet Take 325 mg by mouth daily with breakfast.     [provider]  fluticasone-salmeterol (ADVAIR HFA) 115-21 MCG/ACT inhaler Inhale 2 puffs into the lungs 2 (two) times daily. 10/15/19   Tyler Pita, MD  furosemide (LASIX) 20 MG tablet TAKE 1 TABLET BY MOUTH DAILY 09/19/20   Einar Pheasant, MD  meloxicam (MOBIC) 15 MG tablet Take 1 tablet (15 mg total) by mouth daily. 05/31/20   Edrick Kins, DPM  montelukast (SINGULAIR) 10 MG tablet Take by mouth. 01/13/20 01/12/21  [provider]  mupirocin ointment (BACTROBAN) 2 % Apply 1 application topically 3 (three) times daily. 01/08/20   McLean-Scocuzza, Nino Glow, MD  omeprazole (PRILOSEC) 20 MG capsule Take 1 capsule (20 mg total) by mouth 2 (two) times daily before a meal. 06/23/19   Einar Pheasant, MD  ondansetron (ZOFRAN ODT) 4 MG disintegrating tablet Allow 1-2 tablets to dissolve in your mouth every 8 hours as needed for nausea/vomiting 04/20/20    Hinda Kehr, MD  pramipexole (MIRAPEX) 0.25 MG tablet Take by mouth. 04/28/20 05/28/20  [provider]  PROAIR HFA 108 (90 Base) MCG/ACT inhaler INHALE 2 PUFFS BY MOUTH INTO THE LUNGS EVERY 6 HOURS AS NEEDED FOR WHEEZING 11/20/19   Einar Pheasant, MD  rosuvastatin (CRESTOR) 5 MG tablet Take 1 tablet (5 mg total) by mouth every Monday, Wednesday, and Friday. 05/13/20   Einar Pheasant, MD  Spacer/Aero-Holding Josiah Lobo (EASIVENT) inhaler See admin instructions. 01/13/20 01/12/21  [provider]  traZODone (DESYREL) 50 MG tablet TAKE 2 TABLETS BY MOUTH AT BEDTIME 08/15/20   Einar Pheasant, MD  vitamin B-12 (CYANOCOBALAMIN) 100 MCG tablet Take 100 mcg by mouth daily.    [provider]    Allergies Macrobid [nitrofurantoin macrocrystal]; Antihistamines, diphenhydramine-type; Aspirin; Ceftin [cefuroxime axetil]; Celecoxib; Cymbalta [duloxetine hcl]; Diphenhydramine; Escitalopram; Lexapro [escitalopram oxalate]; Metronidazole; Nitrofurantoin; Zoloft [sertraline hcl]; and Ciprofloxacin  Family History  Problem Relation Age of Onset  . Coronary artery disease Mother        died @ 49 of MI.  . Mental illness Mother   . Diabetes Mother   . Heart attack Father 59       MI  . Cancer Father        lung - died in his mid-70's  . Hypertension Father   . Diabetes Father   . Sudden death Brother   . Mental illness Brother   . Heart attack Brother        MI @ 29, died @ age 80.  . Breast cancer Neg Hx     Social History Social History   Tobacco Use  . Smoking status: Passive Smoke Exposure - Never Smoker  . Smokeless tobacco: Never Used  . Tobacco comment: husband and son smoked in her home.  Vaping Use  . Vaping Use: Never used  Substance Use Topics  . Alcohol use: Not Currently    Alcohol/week: 0.0 standard drinks  . Drug use: No    Review of Systems  Review of Systems  Constitutional: Positive for fatigue. Negative for chills and fever.  HENT: Negative for  sore throat.   Respiratory: Positive for shortness of breath.   Cardiovascular: Negative for chest pain.  Gastrointestinal: Negative for abdominal pain.  Genitourinary: Negative for flank pain.  Musculoskeletal: Negative for neck pain.  Skin: Negative for rash and wound.  Allergic/Immunologic: Negative for immunocompromised  state.  Neurological: Positive for dizziness. Negative for weakness and numbness.  Hematological: Does not bruise/bleed easily.  Psychiatric/Behavioral: The patient is nervous/anxious.      ____________________________________________  PHYSICAL EXAM:      VITAL SIGNS: ED Triage Vitals  Enc Vitals Group     BP 09/19/20 1347 (!) 152/69     Pulse Rate 09/19/20 1347 78     Resp 09/19/20 1347 (!) 22     Temp 09/19/20 1347 97.8 F (36.6 C)     Temp Source 09/19/20 1347 Oral     SpO2 09/19/20 1347 99 %     Weight 09/19/20 1348 200 lb (90.7 kg)     Height 09/19/20 1348 5\' 3"  (1.6 m)     Head Circumference --      Peak Flow --      Pain Score 09/19/20 1348 0     Pain Loc --      Pain Edu? --      Excl. in [place]? --      Physical Exam Vitals and nursing note reviewed.  Constitutional:      General: She is not in acute distress.    Appearance: She is well-developed.  HENT:     Head: Normocephalic and atraumatic.     Comments: Significant serous effusion of the left tympanic membrane with bulging tympanic membrane.  No overt erythema.  Mastoid is nontender. Eyes:     Conjunctiva/sclera: Conjunctivae normal.  Cardiovascular:     Rate and Rhythm: Normal rate and regular rhythm.     Heart sounds: Normal heart sounds. No murmur heard. No friction rub.  Pulmonary:     Effort: Pulmonary effort is normal. No respiratory distress.     Breath sounds: Normal breath sounds. No wheezing or rales.  Abdominal:     General: There is no distension.     Palpations: Abdomen is soft.     Tenderness: There is no abdominal tenderness.  Musculoskeletal:     Cervical back:  Neck supple.  Skin:    General: Skin is warm.     Capillary Refill: Capillary refill takes less than 2 seconds.  Neurological:     Mental Status: She is alert and oriented to person, place, and time.     Motor: No abnormal muscle tone.     Comments: Neurological Exam:  Mental Status: Alert and oriented to person, place, and time. Attention and concentration normal. Speech clear. Recent memory is intact. Cranial Nerves: Visual fields grossly intact. EOMI and PERRLA. No nystagmus noted. Facial sensation intact at forehead, maxillary cheek, and chin/mandible bilaterally. No facial asymmetry or weakness. Hearing grossly normal. Uvula is midline, and palate elevates symmetrically. Normal SCM and trapezius strength. Tongue midline without fasciculations. Motor: Muscle strength 5/5 in proximal and distal UE and LE bilaterally. No pronator drift. Muscle tone normal. Sensation: Intact to light touch in upper and lower extremities distally bilaterally.  Gait: Normal without ataxia. Coordination: Normal FTN bilaterally.          ____________________________________________   LABS (all labs ordered are listed, but only abnormal results are displayed)  Labs Reviewed  BASIC METABOLIC PANEL - Abnormal; Notable for the following components:      Result Value   Glucose, Bld 145 (*)    Creatinine, Ser 1.09 (*)    GFR, Estimated 55 (*)    All other components within normal limits  CBC - Abnormal; Notable for the following components:   WBC 11.5 (*)    Hemoglobin 11.4 (*)  HCT 35.8 (*)    All other components within normal limits  BRAIN NATRIURETIC PEPTIDE - Abnormal; Notable for the following components:   B Natriuretic Peptide 171.9 (*)    All other components within normal limits  HEPATIC FUNCTION PANEL  TROPONIN I (HIGH SENSITIVITY)    ____________________________________________  EKG: Ventricular rate 77.  PR 130, QRS 88, QTc 400.  No acute ST elevations depression.  No acute  evidence of acute ischemia or infarct. ________________________________________  RADIOLOGY All imaging, including plain films, CT scans, and ultrasounds, independently reviewed by me, and interpretations confirmed via formal radiology reads.  ED MD interpretation:   CXR: Chronic pleural thickening, no acute abnormality CT Head: [place]  Official radiology report(s): DG Chest 2 View  Result Date: 09/19/2020 CLINICAL DATA:  Shortness of breath EXAM: CHEST - 2 VIEW COMPARISON:  Chest CT and chest radiograph August 18, 2020 FINDINGS: There is no appreciable edema or airspace opacity. The heart size is upper normal; pulmonary vascularity normal. No adenopathy. Pleural thickening along the lateral right hemithorax inferiorly is stable. There is degenerative change in the thoracic spine. Foci of aortic atherosclerosis noted. Surgical clips right upper quadrant of the abdomen noted. IMPRESSION: Inferolateral right pleural thickening, stable. No edema or airspace opacity. Heart size upper normal. No adenopathy. Aortic Atherosclerosis (ICD10-I70.0). Electronically Signed   By: Lowella Grip III M.D.   On: 09/19/2020 14:40   CT Head Wo Contrast  Result Date: 09/19/2020 CLINICAL DATA:  Vertigo and headaches EXAM: CT HEAD WITHOUT CONTRAST TECHNIQUE: Contiguous axial images were obtained from the base of the skull through the vertex without intravenous contrast. COMPARISON:  11/02/2019 FINDINGS: Brain: No evidence of acute infarction, hemorrhage, hydrocephalus, extra-axial collection or mass lesion/mass effect. Vascular: No hyperdense vessel or unexpected calcification. Skull: Normal. Negative for fracture or focal lesion. Sinuses/Orbits: No acute finding. Other: None. IMPRESSION: No acute intracranial abnormality noted. Electronically Signed   By: Inez Catalina M.D.   On: 09/19/2020 16:08    ____________________________________________  PROCEDURES   Procedure(s) performed (including Critical  Care):  .1-3 Lead EKG Interpretation Performed by: Duffy Bruce, MD Authorized by: Duffy Bruce, MD     Interpretation: normal     ECG rate:  70-80   ECG rate assessment: normal     Rhythm: sinus rhythm     Ectopy: none     Conduction: normal   Comments:     Indication: Weakness    ____________________________________________  INITIAL IMPRESSION / MDM / [place] / ED COURSE  As part of my medical decision making, I reviewed the following data within the [place] notes reviewed and incorporated, Old chart reviewed, Notes from prior ED visits, and [place] Controlled Substance Database       *VELEKA DJORDJEVIC was evaluated in Emergency Department on 09/19/2020 for the symptoms described in the history of present illness. She was evaluated in the context of the global COVID-19 pandemic, which necessitated consideration that the patient might be at risk for infection with the SARS-CoV-2 virus that causes COVID-19. Institutional protocols and algorithms that pertain to the evaluation of patients at risk for COVID-19 are in a state of rapid change based on information released by regulatory bodies including the CDC and federal and state organizations. These policies and algorithms were followed during the patient's care in the ED.  Some ED evaluations and interventions may be delayed as a result of limited staffing during the pandemic.*     Medical Decision Making:  70  yo F here with multiple complaints. Re: her dizziness/loss of balance, this seems to be her main complaint.  She was recently seen by ENT who suspects inner ear dysfunction and she is scheduled for a tilt table test.  Clinically, she does have reproducible, positional vertigo to Genis symptoms with evidence of asymmetric left otitis, which I suspect is contributing to her symptoms.  She has no dysarthria, dysphagia, focal abnormalities on neurological exam, and her symptoms are reproduced  with Dix-Hallpike.  CT head is negative.  Suspect peripheral vertigo clinically, in agreement with ENT.  I had a long discussion with her regarding management for this.  She feels like her Xanax was helping her, which would also fit with vertigo.  She feels like she cannot wait to go back onto it, and there could also be a component of withdrawal.  I discussed that she would need to call ENT to reschedule her tilt test, but will start her on this.  Also trial Flonase and a burst of prednisone to see if we can facilitate eustachian tube drainage.  Otherwise, no other signs of CVA as mentioned.  Her lab work is very reassuring.  Regarding her shortness of breath, this also seems to be a somewhat chronic issue and I suspect is made worse in the setting of stopping her Xanax and trazodone.  Chest x-ray is clear.  She has no increased work of breathing or significant wheezing on exam.  She has recently diagnosed CHF but BNP is only minimally elevated and she does not appear edematous on exam.  Will have her restart her meds, continue Lasix as needed, and follow-up with PCP.  EKG nonischemic.  Do not suspect ACS.  ____________________________________________  FINAL CLINICAL IMPRESSION(S) / ED DIAGNOSES  Final diagnoses:  Inner ear dysfunction, left  Non-recurrent acute serous otitis media of left ear     MEDICATIONS GIVEN DURING THIS VISIT:  Medications  methylPREDNISolone sodium succinate (SOLU-MEDROL) 125 mg/2 mL injection 60 mg (60 mg Intravenous Given 09/19/20 1815)     ED Discharge Orders         Ordered    fluticasone (FLONASE) 50 MCG/ACT nasal spray  Daily        09/19/20 1747    predniSONE (DELTASONE) 20 MG tablet  Daily        09/19/20 1747    amoxicillin-clavulanate (AUGMENTIN) 875-125 MG tablet  2 times daily        09/19/20 1747           Note:  This document was prepared using Dragon voice recognition software and may include unintentional dictation errors.   Duffy Bruce,  MD 09/19/20 [id number]

## 2020-09-19 NOTE — Discharge Instructions (Signed)
For your ears, start taking the prednisone and Flonase.  Have also prescribed Augmentin.  I would recommend continuing your Xanax and trazodone.  You can take your Xanax actually up to 3 times a day as it may help with your vertigo/dizziness.  I would recommend rescheduling your tilt test as these would affect the results.  For your Lasix, I would take it daily while on the prednisone for the next 3 days, then go back to taking it only as needed.  Try not to take it more than once every other day.  Continue the potassium on days that you take the Lasix.

## 2020-09-19 NOTE — ED Triage Notes (Signed)
Patient to ER for c/o shortness of breath for several weeks. Patient reports h/o COPD and Asthma, but states this doesn't feel like it is related to those, has also seen ENT for same complaint. Patient reports feeling pressure in head d/t shortness of breath.

## 2020-09-19 NOTE — Telephone Encounter (Signed)
LM for [facility]

## 2020-09-19 NOTE — Telephone Encounter (Signed)
Patient was given instructions of what medications to stop for vestibular therapy. Called patient to go over but she is currently at ED

## 2020-09-19 NOTE — Telephone Encounter (Signed)
Pt called and said that she is having a test done at ENT on Wednesday and needs to come off some of her medication before the test she wanted to know from Dr. Nicki Reaper what she needs to do

## 2020-09-19 NOTE — ED Notes (Signed)
Pt states that provider was going to order prednisone injection for before discharge. Will discharge pt once this is clarified. Send msg to provider.

## 2020-09-20 NOTE — Telephone Encounter (Signed)
Patient was given augmentin prednisone and flonase for vertigo and otitis media. Has f/u appt with PCP on 4/14. Was instructed to cancel tilt test through ENT as these medication can affect results. LM for patient to confirm doing ok since discharge.

## 2020-09-22 ENCOUNTER — Other Ambulatory Visit: Payer: Self-pay

## 2020-09-22 ENCOUNTER — Ambulatory Visit (INDEPENDENT_AMBULATORY_CARE_PROVIDER_SITE_OTHER): Payer: Medicare Other | Admitting: Internal Medicine

## 2020-09-22 VITALS — BP 128/76 | HR 72 | Temp 97.2°F | Resp 16 | Ht 63.0 in | Wt 200.6 lb

## 2020-09-22 DIAGNOSIS — G20 Parkinson's disease: Secondary | ICD-10-CM

## 2020-09-22 DIAGNOSIS — R7989 Other specified abnormal findings of blood chemistry: Secondary | ICD-10-CM

## 2020-09-22 DIAGNOSIS — N183 Chronic kidney disease, stage 3 unspecified: Secondary | ICD-10-CM

## 2020-09-22 DIAGNOSIS — R739 Hyperglycemia, unspecified: Secondary | ICD-10-CM | POA: Diagnosis not present

## 2020-09-22 DIAGNOSIS — I773 Arterial fibromuscular dysplasia: Secondary | ICD-10-CM

## 2020-09-22 DIAGNOSIS — K219 Gastro-esophageal reflux disease without esophagitis: Secondary | ICD-10-CM

## 2020-09-22 DIAGNOSIS — J449 Chronic obstructive pulmonary disease, unspecified: Secondary | ICD-10-CM

## 2020-09-22 DIAGNOSIS — F339 Major depressive disorder, recurrent, unspecified: Secondary | ICD-10-CM

## 2020-09-22 DIAGNOSIS — D649 Anemia, unspecified: Secondary | ICD-10-CM

## 2020-09-22 DIAGNOSIS — E2839 Other primary ovarian failure: Secondary | ICD-10-CM

## 2020-09-22 DIAGNOSIS — I779 Disorder of arteries and arterioles, unspecified: Secondary | ICD-10-CM | POA: Diagnosis not present

## 2020-09-22 DIAGNOSIS — E7800 Pure hypercholesterolemia, unspecified: Secondary | ICD-10-CM

## 2020-09-22 DIAGNOSIS — M810 Age-related osteoporosis without current pathological fracture: Secondary | ICD-10-CM

## 2020-09-22 DIAGNOSIS — I10 Essential (primary) hypertension: Secondary | ICD-10-CM | POA: Diagnosis not present

## 2020-09-22 MED ORDER — OMEPRAZOLE 20 MG PO CPDR: 20.0000 mg | DELAYED_RELEASE_CAPSULE | Freq: Every day | ORAL | 1 refills | Status: DC

## 2020-09-22 MED ORDER — MONTELUKAST SODIUM 10 MG PO TABS: 10.0000 mg | ORAL_TABLET | Freq: Every day | ORAL | 2 refills | Status: DC

## 2020-09-22 NOTE — Progress Notes (Signed)
Patient ID: Victoria Hanson, female   DOB: 28-Nov-1950, 70 y.o.   MRN: [id number]   Subjective:    Patient ID: Victoria Hanson, female    DOB: 1951/01/01, 70 y.o.   MRN: [id number]  HPI This visit occurred during the SARS-CoV-2 public health emergency.  Safety protocols were in place, including screening questions prior to the visit, additional usage of staff PPE, and extensive cleaning of exam room while observing appropriate contact time as indicated for disinfecting solutions.  Patient here for a scheduled follow up. Was seen in ER 09/19/20.  Was evaluated for dizziness and sob.  She recently saw ENT and was scheduled to get a tilt table test.  Was instructed to stop trazodone and xanax.  ER note reviewed.  Per note had reproducible positional vertigo.  No focal neurological deficits.  CT head negative. Was restarted on xanax.  Placed on prednisone and instructed to use flonase.  She reports she is feeling better now.  No headache or dizziness currently.  No chest pain.  SOB improved with restarting xanax.  Breathing stable now.  Occasional acid reflux.  Request refill of prilosec.  No abdominal pain.  She is back on advair.  Off trelegy. Does want to restart singulair.  Left knee is better.  Handling stress.  Overall feeling better.   Past Medical History:  Diagnosis Date  . Anxiety   . Asthma   . Atypical chest pain    a. 2003 Cath: reportedly nl per pt [name]);  b. 04/2015 [facility] Admission, neg troponin.  . Colon cancer ([place]) 1999   a. 1999: Pt had partial colectomy. Did develop lung metastasis. Had right upper lobectomy in 01/2007 then had associated chemotherapy.  Marland Kitchen COPD (chronic obstructive pulmonary disease) ([place])   . DDD (degenerative disc disease), lumbosacral   . Depression   . Echocardiogram abnormal    a. 02/2010 Echo: EF 55-60%, mild LAE, no regional wall motion abnormalities  . Elevated transaminase level    a. ? NASH;  b. 06/2015 - nl LFTs.  . GERD (gastroesophageal reflux  disease)   . Hematuria   . Holter monitor, abnormal    a. 12/2009 Holter monitoring: NSR, rare PACs and PVCs.  Marland Kitchen Hypercholesterolemia   . Hyperlipidemia   . Hypertension   . Leaky heart valve    a. Per pt report - no evidence of valvular abnormalities on prior echoes.  . Lesion of skin of Right breast 10/20/2017  . Lung metastases ([place]) 2008   a. S/P resection (lobectomy - right)  . Morbid obesity ([place])   . Nephrolithiasis   . Parkinson disease ([place]) 08/2016   [name]   Past Surgical History:  Procedure Laterality Date  . BREAST EXCISIONAL BIOPSY Right 10/07/2009   benign  . CARDIAC CATHETERIZATION  2003   negative as per pt report  . CHOLECYSTECTOMY  2000  . COLON SURGERY     Colon cancer, removed part of large colon  . COLONOSCOPY N/A 04/25/2015   Procedure: COLONOSCOPY;  Surgeon: Manya Silvas, MD;  Location: [facility] ENDOSCOPY;  Service: Endoscopy;  Laterality: N/A;  . COLONOSCOPY WITH PROPOFOL N/A 06/14/2017   Procedure: COLONOSCOPY WITH PROPOFOL;  Surgeon: Manya Silvas, MD;  Location: [facility] ENDOSCOPY;  Service: Endoscopy;  Laterality: N/A;  . COLONOSCOPY WITH PROPOFOL N/A 11/17/2019   Procedure: COLONOSCOPY WITH PROPOFOL;  Surgeon: [name], MD;  Location[facility] END[facility]COPY;  Service: Gastroenterology;  Laterality: N/A;  . LOBECTOMY  01/2007   Right lung, upper lobe right  side removed  . TOTAL ABDOMINAL HYSTERECTOMY     abnormal bleeding  . TUBAL LIGATION  1980   Family History  Problem Relation Age of Onset  . Coronary artery disease Mother        died @ 66 of MI.  . Mental illness Mother   . Diabetes Mother   . Heart attack Father 37       MI  . Cancer Father        lung - died in his mid-70's  . Hypertension Father   . Diabetes Father   . Sudden death Brother   . Mental illness Brother   . Heart attack Brother        MI @ 22, died @ age 30.  . Breast cancer Neg Hx    Social History   Socioeconomic History  . Marital status: Widowed    Spouse name: Not  on file  . Number of children: Not on file  . Years of education: Not on file  . Highest education level: Not on file  Occupational History  . Not on file  Tobacco Use  . Smoking status: Passive Smoke Exposure - Never Smoker  . Smokeless tobacco: Never Used  . Tobacco comment: husband and son smoked in her home.  Vaping Use  . Vaping Use: Never used  Substance and Sexual Activity  . Alcohol use: Not Currently    Alcohol/week: 0.0 standard drinks  . Drug use: No  . Sexual activity: Never  Other Topics Concern  . Not on file  Social History Narrative   Widowed.   Disabled (colon/lung CA), retired.   Lives in [place] with her dtr, grand-dtr, and son.   Active.   Gets regular exercise, walks.   Social Determinants of Health   Financial Resource Strain: Not on file  Food Insecurity: Not on file  Transportation Needs: Not on file  Physical Activity: Not on file  Stress: Not on file  Social Connections: Not on file    Outpatient Encounter Medications as of 09/22/2020  Medication Sig  . ALPRAZolam (XANAX) 0.5 MG tablet TAKE 1/2 TABLET BY MOUTH DAILY AS NEEDED  . amLODipine (NORVASC) 2.5 MG tablet Take 1 tablet (2.5 mg total) by mouth daily.  Marland Kitchen amoxicillin-clavulanate (AUGMENTIN) 875-125 MG tablet Take 1 tablet by mouth 2 (two) times daily for 7 days.  Marland Kitchen aspirin EC 81 MG tablet Take 81 mg by mouth as needed.   . calcium-vitamin D (OSCAL WITH D) 500-200 MG-UNIT tablet Take 1 tablet by mouth.  . carbidopa-levodopa (SINEMET) 25-100 MG tablet Take 1.5 in am and one at 11:00 and one in afternoon and 1.5 at night.  . citalopram (CELEXA) 10 MG tablet Take 1 tablet (10 mg total) by mouth daily.  . diclofenac Sodium (VOLTAREN) 1 % GEL Apply 2 g topically 4 (four) times daily.  . fluticasone (FLONASE) 50 MCG/ACT nasal spray Place 2 sprays into both nostrils daily.  . fluticasone-salmeterol (ADVAIR HFA) 115-21 MCG/ACT inhaler Inhale 2 puffs into the lungs 2 (two) times daily.  .  furosemide (LASIX) 20 MG tablet TAKE 1 TABLET BY MOUTH DAILY  . PROAIR HFA 108 (90 Base) MCG/ACT inhaler INHALE 2 PUFFS BY MOUTH INTO THE LUNGS EVERY 6 HOURS AS NEEDED FOR WHEEZING  . rosuvastatin (CRESTOR) 5 MG tablet Take 1 tablet (5 mg total) by mouth every Monday, Wednesday, and Friday.  Marland Kitchen Spacer/Aero-Holding Chambers (EASIVENT) inhaler See admin instructions.  . traZODone (DESYREL) 50 MG tablet TAKE 2 TABLETS  BY MOUTH AT BEDTIME  . vitamin B-12 (CYANOCOBALAMIN) 100 MCG tablet Take 100 mcg by mouth daily.  . [DISCONTINUED] omeprazole (PRILOSEC) 20 MG capsule Take 1 capsule (20 mg total) by mouth 2 (two) times daily before a meal.  . montelukast (SINGULAIR) 10 MG tablet Take 1 tablet (10 mg total) by mouth at bedtime.  Marland Kitchen omeprazole (PRILOSEC) 20 MG capsule Take 1 capsule (20 mg total) by mouth daily.  . [DISCONTINUED] buPROPion (WELLBUTRIN XL) 150 MG 24 hr tablet Take 1 tablet (150 mg total) by mouth daily. (Patient not taking: Reported on 09/22/2020)  . [DISCONTINUED] cyclobenzaprine (FLEXERIL) 10 MG tablet Take 1 tablet (10 mg total) by mouth 3 (three) times daily as needed for muscle spasms. (Patient not taking: Reported on 09/22/2020)  . [DISCONTINUED] ferrous sulfate 325 (65 FE) MG tablet Take 325 mg by mouth daily with breakfast.  (Patient not taking: Reported on 09/22/2020)  . [DISCONTINUED] meloxicam (MOBIC) 15 MG tablet Take 1 tablet (15 mg total) by mouth daily. (Patient not taking: Reported on 09/22/2020)  . [DISCONTINUED] montelukast (SINGULAIR) 10 MG tablet Take by mouth. (Patient not taking: Reported on 09/22/2020)  . [DISCONTINUED] mupirocin ointment (BACTROBAN) 2 % Apply 1 application topically 3 (three) times daily. (Patient not taking: Reported on 09/22/2020)  . [DISCONTINUED] ondansetron (ZOFRAN ODT) 4 MG disintegrating tablet Allow 1-2 tablets to dissolve in your mouth every 8 hours as needed for nausea/vomiting (Patient not taking: Reported on 09/22/2020)  . [DISCONTINUED]  pramipexole (MIRAPEX) 0.25 MG tablet Take by mouth.  . [DISCONTINUED] predniSONE (DELTASONE) 20 MG tablet Take 2 tablets (40 mg total) by mouth daily for 3 days. (Patient not taking: Reported on 09/22/2020)   No facility-administered encounter medications on file as of 09/22/2020.    Review of Systems  Constitutional: Negative for appetite change and unexpected weight change.  HENT: Negative for congestion and sinus pressure.   Respiratory: Negative for cough and chest tightness.        Breathing at baseline. No increased sob.   Cardiovascular: Negative for chest pain, palpitations and leg swelling.  Gastrointestinal: Negative for abdominal pain, diarrhea, nausea and vomiting.  Genitourinary: Negative for difficulty urinating and dysuria.  Musculoskeletal: Negative for joint swelling and myalgias.  Skin: Negative for color change and rash.  Neurological: Negative for dizziness, light-headedness and headaches.       Dizziness resolved.   Psychiatric/Behavioral: Negative for agitation and dysphoric mood.       Objective:    Physical Exam Vitals reviewed.  Constitutional:      General: She is not in acute distress.    Appearance: Normal appearance.  HENT:     Head: Normocephalic and atraumatic.     Right Ear: External ear normal.     Left Ear: External ear normal.  Eyes:     General: No scleral icterus.       Right eye: No discharge.        Left eye: No discharge.     Conjunctiva/sclera: Conjunctivae normal.  Neck:     Thyroid: No thyromegaly.  Cardiovascular:     Rate and Rhythm: Normal rate and regular rhythm.  Pulmonary:     Effort: No respiratory distress.     Breath sounds: Normal breath sounds. No wheezing.  Abdominal:     General: Bowel sounds are normal.     Palpations: Abdomen is soft.     Tenderness: There is no abdominal tenderness.  Musculoskeletal:        General: No swelling or  tenderness.     Cervical back: Neck supple. No tenderness.  Lymphadenopathy:      Cervical: No cervical adenopathy.  Skin:    Findings: No erythema or rash.  Neurological:     Mental Status: She is alert.  Psychiatric:        Mood and Affect: Mood normal.        Behavior: Behavior normal.     BP 128/76   Pulse 72   Temp (!) 97.2 F (36.2 C) (Temporal)   Resp 16   Ht _0  (1.6 m)   Wt 200 lb 9.6 oz (91 kg)   SpO2 97%   BMI 35.53 kg/m  Wt Readings from Last 3 Encounters:  09/22/20 200 lb 9.6 oz (91 kg)  09/19/20 200 lb (90.7 kg)  08/23/20 207 lb (93.9 kg)     Lab Results  Component Value Date   WBC 11.5 (H) 09/19/2020   HGB 11.4 (L) 09/19/2020   HCT 35.8 (L) 09/19/2020   PLT 270 09/19/2020   GLUCOSE 145 (H) 09/19/2020   CHOL 140 07/07/2020   TRIG 68.0 07/07/2020   HDL 62.40 07/07/2020   LDLDIRECT 163.4 12/31/2012   LDLCALC 64 07/07/2020   ALT <5 09/19/2020   AST 21 09/19/2020   NA 139 09/19/2020   K 3.8 09/19/2020   CL 103 09/19/2020   CREATININE 1.09 (H) 09/19/2020   BUN 17 09/19/2020   CO2 26 09/19/2020   TSH 1.86 12/17/2019   INR 1.0 12/12/2012   HGBA1C 6.1 07/07/2020    DG Chest 2 View  Result Date: 09/19/2020 CLINICAL DATA:  Shortness of breath EXAM: CHEST - 2 VIEW COMPARISON:  Chest CT and chest radiograph August 18, 2020 FINDINGS: There is no appreciable edema or airspace opacity. The heart size is upper normal; pulmonary vascularity normal. No adenopathy. Pleural thickening along the lateral right hemithorax inferiorly is stable. There is degenerative change in the thoracic spine. Foci of aortic atherosclerosis noted. Surgical clips right upper quadrant of the abdomen noted. IMPRESSION: Inferolateral right pleural thickening, stable. No edema or airspace opacity. Heart size upper normal. No adenopathy. Aortic Atherosclerosis (ICD10-I70.0). Electronically Signed   By: Lowella Grip III M.D.   On: 09/19/2020 14:40   CT Head Wo Contrast  Result Date: 09/19/2020 CLINICAL DATA:  Vertigo and headaches EXAM: CT HEAD WITHOUT CONTRAST  TECHNIQUE: Contiguous axial images were obtained from the base of the skull through the vertex without intravenous contrast. COMPARISON:  11/02/2019 FINDINGS: Brain: No evidence of acute infarction, hemorrhage, hydrocephalus, extra-axial collection or mass lesion/mass effect. Vascular: No hyperdense vessel or unexpected calcification. Skull: Normal. Negative for fracture or focal lesion. Sinuses/Orbits: No acute finding. Other: None. IMPRESSION: No acute intracranial abnormality noted. Electronically Signed   By: Inez Catalina M.D.   On: 09/19/2020 16:08       Assessment & Plan:   Problem List Items Addressed This Visit    Anemia    Slight decreased hgb on recent check.  Have her complete her abx.  Follow symptoms.  Plan for f/u cbc and iron studies.       Relevant Orders   CBC with Differential/Platelet   IBC + Ferritin   Carotid artery disease ([place])    Saw [facility] 07/2016 - carotid ultrasound - <30% bilateral.  Recommended f/u prn.       Chronic obstructive pulmonary disease (HCC)    On advair now.  Off trelegy. Plans to restart singulair.  Breathing stable.  No increased sob.  Relevant Medications   montelukast (SINGULAIR) 10 MG tablet   CKD (chronic kidney disease), stage III (HCC)    Avoid antiinflammatories.  Follow metabolic panel.       Relevant Orders   Basic metabolic panel   Depression, recurrent ([place])    On citalopram now.  Stable.  Taking xanax prn.  Follow.       Elevated liver function tests    Recent liver panel wnl.       Fibromuscular dysplasia ([place])    Has been evaluated by [facility]. Continue risk factor modification.       GERD (gastroesophageal reflux disease)    Minimal acid reflux reported.  Refill prilosec.       Relevant Medications   omeprazole (PRILOSEC) 20 MG capsule   Hypercholesterolemia    Continue crestor.  Low cholesterol diet and exercise.  Follow lipid panel and liver function tests.       Hyperglycemia    Low carb diet and  exercise.  Follow met b and a1c.       Hypertension    Blood pressure doing well.  Continue amlodipine.  Follow metabolic panel.       Osteoporosis    Recheck bone density.  Currently not on treatment.       Relevant Orders   VITAMIN D 25 Hydroxy (Vit-D Deficiency, Fractures)   Parkinson's disease ([place])    Followed by neurology.  On sinemet        Other Visit Diagnoses    Estrogen deficiency    -  Primary   Relevant Orders   DG Bone Density       Einar Pheasant, MD

## 2020-09-23 ENCOUNTER — Encounter: Payer: Self-pay | Admitting: Internal Medicine

## 2020-09-23 NOTE — Progress Notes (Incomplete)
Patient ID: Victoria Hanson, female   DOB: 16-Aug-1950, 70 y.o.   MRN: [id number]   Subjective:    Patient ID: Victoria Hanson, female    DOB: 03/08/1951, 70 y.o.   MRN: [id number]  HPI This visit occurred during the SARS-CoV-2 public health emergency.  Safety protocols were in place, including screening questions prior to the visit, additional usage of staff PPE, and extensive cleaning of exam room while observing appropriate contact time as indicated for disinfecting solutions.  Patient here for a scheduled follow up. Was seen in ER 09/19/20.  Was evaluated for dizziness and sob.  She recently saw ENT and was scheduled to get a tilt table test.    Past Medical History:  Diagnosis Date  . Anxiety   . Asthma   . Atypical chest pain    a. 2003 Cath: reportedly nl per pt [name]);  b. 04/2015 [place] Admission, neg troponin.  . Colon cancer ([place]) 1999   a. 1999: Pt had partial colectomy. Did develop lung metastasis. Had right upper lobectomy in 01/2007 then had associated chemotherapy.  Marland Kitchen COPD (chronic obstructive pulmonary disease) ([place])   . DDD (degenerative disc disease), lumbosacral   . Depression   . Echocardiogram abnormal    a. 02/2010 Echo: EF 55-60%, mild LAE, no regional wall motion abnormalities  . Elevated transaminase level    a. ? NASH;  b. 06/2015 - nl LFTs.  . GERD (gastroesophageal reflux disease)   . Hematuria   . Holter monitor, abnormal    a. 12/2009 Holter monitoring: NSR, rare PACs and PVCs.  Marland Kitchen Hypercholesterolemia   . Hyperlipidemia   . Hypertension   . Leaky heart valve    a. Per pt report - no evidence of valvular abnormalities on prior echoes.  . Lesion of skin of Right breast 10/20/2017  . Lung metastases ([place]) 2008   a. S/P resection (lobectomy - right)  . Morbid obesity ([place])   . Nephrolithiasis   . Parkinson disease ([place]) 08/2016   [name]   Past Surgical History:  Procedure Laterality Date  . BREAST EXCISIONAL BIOPSY Right 10/07/2009   benign  .  CARDIAC CATHETERIZATION  2003   negative as per pt report  . CHOLECYSTECTOMY  2000  . COLON SURGERY     Colon cancer, removed part of large colon  . COLONOSCOPY N/A 04/25/2015   Procedure: COLONOSCOPY;  Surgeon: Manya Silvas, MD;  Location: [facility] ENDOSCOPY;  Service: Endoscopy;  Laterality: N/A;  . COLONOSCOPY WITH PROPOFOL N/A 06/14/2017   Procedure: COLONOSCOPY WITH PROPOFOL;  Surgeon: Manya Silvas, MD;  Location[facility] ENDOSCOPY;  Service: Endoscopy;  Laterality: N/A;  . COLONOSCOPY WITH PROPOFOL N/A 11/17/2019   Procedure: COLONOSCOPY WITH PROPOFOL;  Surgeon: [name], MD;  Location: [facility] ENDOSCOPY;  Service: Gastroenterology;  Laterality: N/A;  . LOBECTOMY  01/2007   Right lung, upper lobe right side removed  . TOTAL ABDOMINAL HYSTERECTOMY     abnormal bleeding  . TUBAL LIGATION  1980   Family History  Problem Relation Age of Onset  . Coronary artery disease Mother        died @ 6 of MI.  . Mental illness Mother   . Diabetes Mother   . Heart attack Father 44       MI  . Cancer Father        lung - died in his mid-70's  . Hypertension Father   . Diabetes Father   . Sudden death Brother   . Mental  illness Brother   . Heart attack Brother        MI @ 40, died @ age 34.  . Breast cancer Neg Hx    Social History   Socioeconomic History  . Marital status: Widowed    Spouse name: Not on file  . Number of children: Not on file  . Years of education: Not on file  . Highest education level: Not on file  Occupational History  . Not on file  Tobacco Use  . Smoking status: Passive Smoke Exposure - Never Smoker  . Smokeless tobacco: Never Used  . Tobacco comment: husband and son smoked in her home.  Vaping Use  . Vaping Use: Never used  Substance and Sexual Activity  . Alcohol use: Not Currently    Alcohol/week: 0.0 standard drinks  . Drug use: No  . Sexual activity: Never  Other Topics Concern  . Not on file  Social History Narrative   Widowed.   Disabled  (colon/lung CA), retired.   Lives in [place] with her dtr, grand-dtr, and son.   Active.   Gets regular exercise, walks.   Social Determinants of Health   Financial Resource Strain: Not on file  Food Insecurity: Not on file  Transportation Needs: Not on file  Physical Activity: Not on file  Stress: Not on file  Social Connections: Not on file    Outpatient Encounter Medications as of 09/22/2020  Medication Sig  . ALPRAZolam (XANAX) 0.5 MG tablet TAKE 1/2 TABLET BY MOUTH DAILY AS NEEDED  . amLODipine (NORVASC) 2.5 MG tablet Take 1 tablet (2.5 mg total) by mouth daily.  Marland Kitchen amoxicillin-clavulanate (AUGMENTIN) 875-125 MG tablet Take 1 tablet by mouth 2 (two) times daily for 7 days.  Marland Kitchen aspirin EC 81 MG tablet Take 81 mg by mouth as needed.   . calcium-vitamin D (OSCAL WITH D) 500-200 MG-UNIT tablet Take 1 tablet by mouth.  . carbidopa-levodopa (SINEMET) 25-100 MG tablet Take 1.5 in am and one at 11:00 and one in afternoon and 1.5 at night.  . citalopram (CELEXA) 10 MG tablet Take 1 tablet (10 mg total) by mouth daily.  . diclofenac Sodium (VOLTAREN) 1 % GEL Apply 2 g topically 4 (four) times daily.  . fluticasone (FLONASE) 50 MCG/ACT nasal spray Place 2 sprays into both nostrils daily.  . fluticasone-salmeterol (ADVAIR HFA) 115-21 MCG/ACT inhaler Inhale 2 puffs into the lungs 2 (two) times daily.  . furosemide (LASIX) 20 MG tablet TAKE 1 TABLET BY MOUTH DAILY  . omeprazole (PRILOSEC) 20 MG capsule Take 1 capsule (20 mg total) by mouth 2 (two) times daily before a meal.  . PROAIR HFA 108 (90 Base) MCG/ACT inhaler INHALE 2 PUFFS BY MOUTH INTO THE LUNGS EVERY 6 HOURS AS NEEDED FOR WHEEZING  . rosuvastatin (CRESTOR) 5 MG tablet Take 1 tablet (5 mg total) by mouth every Monday, Wednesday, and Friday.  Marland Kitchen Spacer/Aero-Holding Chambers (EASIVENT) inhaler See admin instructions.  . traZODone (DESYREL) 50 MG tablet TAKE 2 TABLETS BY MOUTH AT BEDTIME  . vitamin B-12 (CYANOCOBALAMIN) 100 MCG  tablet Take 100 mcg by mouth daily.  Marland Kitchen buPROPion (WELLBUTRIN XL) 150 MG 24 hr tablet Take 1 tablet (150 mg total) by mouth daily. (Patient not taking: Reported on 09/22/2020)  . cyclobenzaprine (FLEXERIL) 10 MG tablet Take 1 tablet (10 mg total) by mouth 3 (three) times daily as needed for muscle spasms. (Patient not taking: Reported on 09/22/2020)  . ferrous sulfate 325 (65 FE) MG tablet Take 325 mg  by mouth daily with breakfast.  (Patient not taking: Reported on 09/22/2020)  . meloxicam (MOBIC) 15 MG tablet Take 1 tablet (15 mg total) by mouth daily. (Patient not taking: Reported on 09/22/2020)  . montelukast (SINGULAIR) 10 MG tablet Take by mouth. (Patient not taking: Reported on 09/22/2020)  . mupirocin ointment (BACTROBAN) 2 % Apply 1 application topically 3 (three) times daily. (Patient not taking: Reported on 09/22/2020)  . ondansetron (ZOFRAN ODT) 4 MG disintegrating tablet Allow 1-2 tablets to dissolve in your mouth every 8 hours as needed for nausea/vomiting (Patient not taking: Reported on 09/22/2020)  . pramipexole (MIRAPEX) 0.25 MG tablet Take by mouth.  . predniSONE (DELTASONE) 20 MG tablet Take 2 tablets (40 mg total) by mouth daily for 3 days. (Patient not taking: Reported on 09/22/2020)   No facility-administered encounter medications on file as of 09/22/2020.    Review of Systems     Objective:    Physical Exam  BP 128/76   Pulse 72   Temp (!) 97.2 F (36.2 C) (Temporal)   Resp 16   Ht 5\' 3"  (1.6 m)   Wt 200 lb 9.6 oz (91 kg)   SpO2 97%   BMI 35.53 kg/m  Wt Readings from Last 3 Encounters:  09/22/20 200 lb 9.6 oz (91 kg)  09/19/20 200 lb (90.7 kg)  08/23/20 207 lb (93.9 kg)     Lab Results  Component Value Date   WBC 11.5 (H) 09/19/2020   HGB 11.4 (L) 09/19/2020   HCT 35.8 (L) 09/19/2020   PLT 270 09/19/2020   GLUCOSE 145 (H) 09/19/2020   CHOL 140 07/07/2020   TRIG 68.0 07/07/2020   HDL 62.40 07/07/2020   LDLDIRECT 163.4 12/31/2012   LDLCALC 64 07/07/2020    ALT <5 09/19/2020   AST 21 09/19/2020   NA 139 09/19/2020   K 3.8 09/19/2020   CL 103 09/19/2020   CREATININE 1.09 (H) 09/19/2020   BUN 17 09/19/2020   CO2 26 09/19/2020   TSH 1.86 12/17/2019   INR 1.0 12/12/2012   HGBA1C 6.1 07/07/2020    DG Chest 2 View  Result Date: 09/19/2020 CLINICAL DATA:  Shortness of breath EXAM: CHEST - 2 VIEW COMPARISON:  Chest CT and chest radiograph August 18, 2020 FINDINGS: There is no appreciable edema or airspace opacity. The heart size is upper normal; pulmonary vascularity normal. No adenopathy. Pleural thickening along the lateral right hemithorax inferiorly is stable. There is degenerative change in the thoracic spine. Foci of aortic atherosclerosis noted. Surgical clips right upper quadrant of the abdomen noted. IMPRESSION: Inferolateral right pleural thickening, stable. No edema or airspace opacity. Heart size upper normal. No adenopathy. Aortic Atherosclerosis (ICD10-I70.0). Electronically Signed   By: Lowella Grip III M.D.   On: 09/19/2020 14:40   CT Head Wo Contrast  Result Date: 09/19/2020 CLINICAL DATA:  Vertigo and headaches EXAM: CT HEAD WITHOUT CONTRAST TECHNIQUE: Contiguous axial images were obtained from the base of the skull through the vertex without intravenous contrast. COMPARISON:  11/02/2019 FINDINGS: Brain: No evidence of acute infarction, hemorrhage, hydrocephalus, extra-axial collection or mass lesion/mass effect. Vascular: No hyperdense vessel or unexpected calcification. Skull: Normal. Negative for fracture or focal lesion. Sinuses/Orbits: No acute finding. Other: None. IMPRESSION: No acute intracranial abnormality noted. Electronically Signed   By: Inez Catalina M.D.   On: 09/19/2020 16:08       Assessment & Plan:   Problem List Items Addressed This Visit   None      Einar Pheasant,  MD

## 2020-09-24 ENCOUNTER — Encounter: Payer: Self-pay | Admitting: Internal Medicine

## 2020-09-24 IMAGING — CT CT ABDOMEN AND PELVIS WITH CONTRAST
2 of 5 series · 16 of 46 positions shown, 18 images · IV contrast (APPLIED)
Comparison: 03/07/2018

CLINICAL DATA: Abdominal pain, swelling, and change in bowel
habits. Personal history of metastatic colon carcinoma.

EXAM:
CT ABDOMEN AND PELVIS WITH CONTRAST
TECHNIQUE: Multidetector CT imaging of the abdomen and pelvis was performed
using the standard protocol following bolus administration of
intravenous contrast.
CONTRAST:  75mL OMNIPAQUE IOHEXOL 300 MG/ML  SOLN

[Series 2: routine abd/pel with · axial · 0.76mm/px · z∈[-477,-82]mm · 13 of 89 slices shown, 15 images]
[im 5/89  soft-tissue]
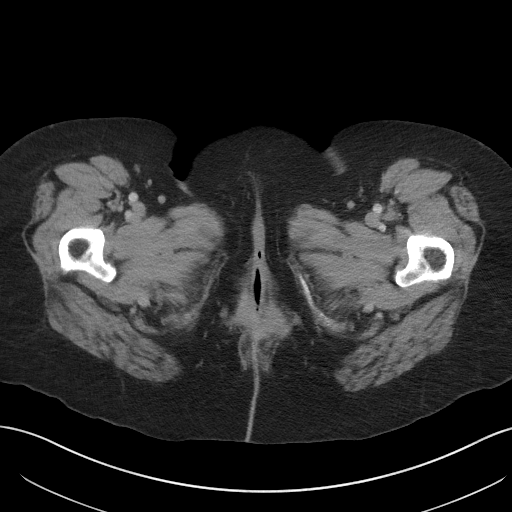
[im 5/89  bone]
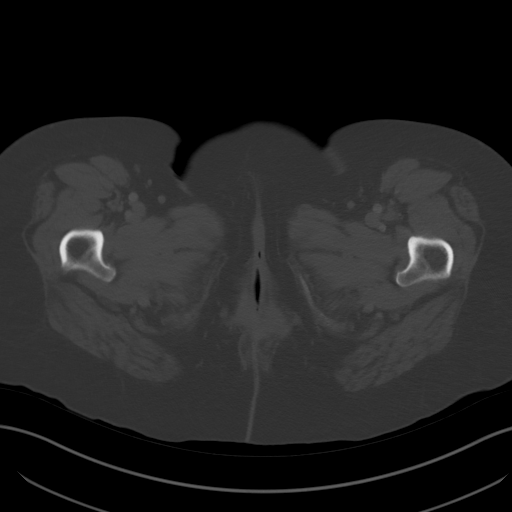
[im 14/89  soft-tissue]
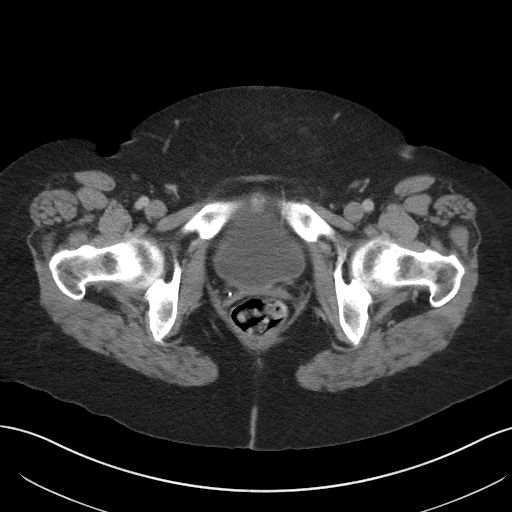
[im 18/89  soft-tissue]
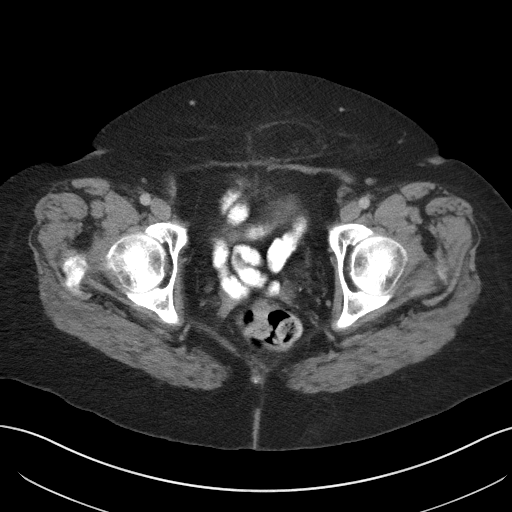
[im 27/89  soft-tissue]
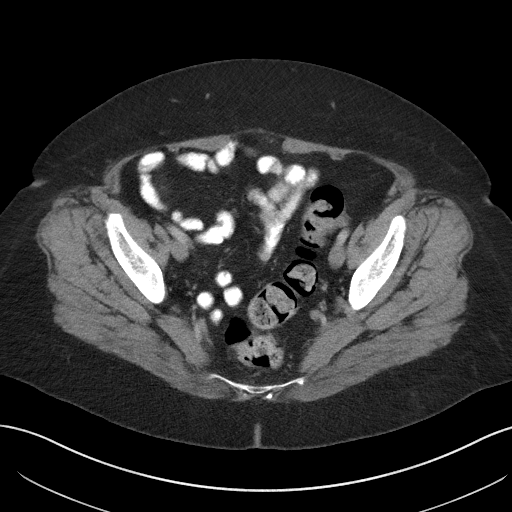
[im 31/89  soft-tissue]
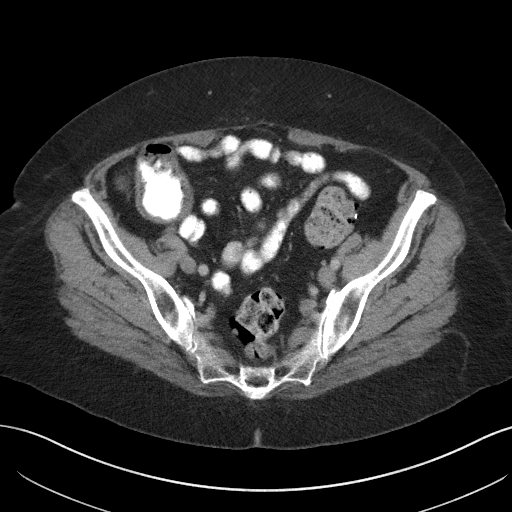
[im 40/89  soft-tissue]
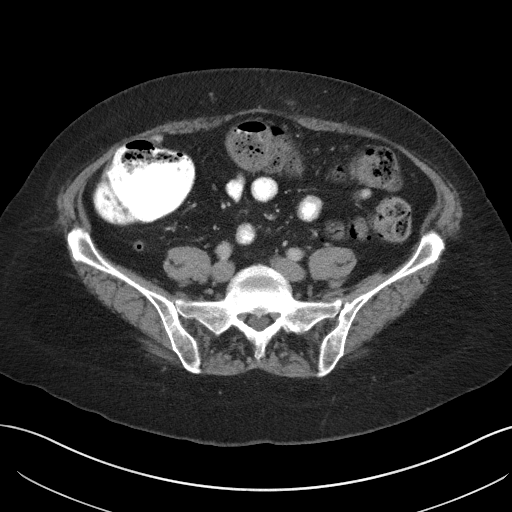
[im 45/89  soft-tissue]
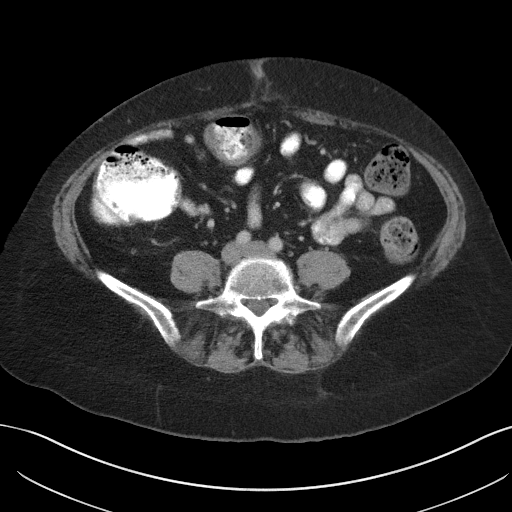
[im 49/89  soft-tissue]
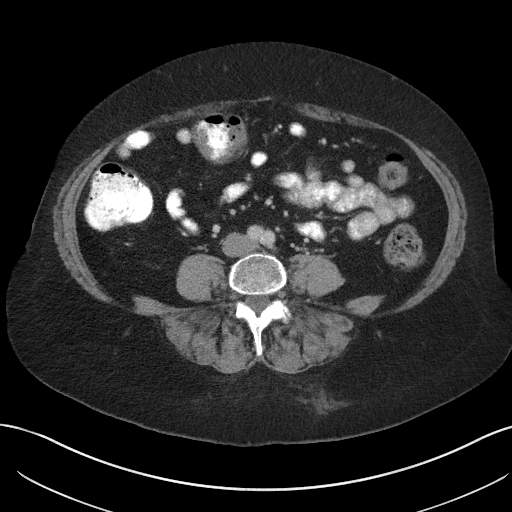
[im 58/89  soft-tissue]
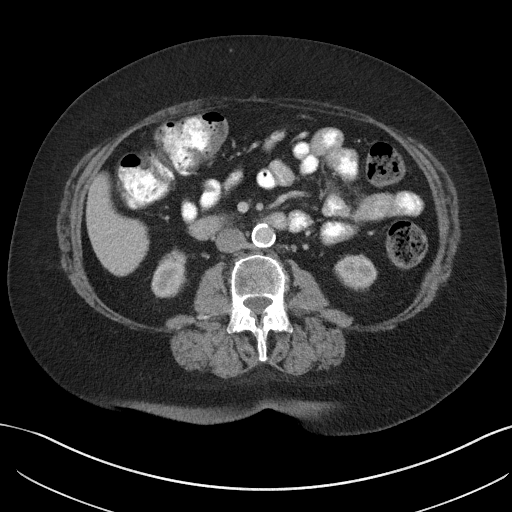
[im 58/89  bone]
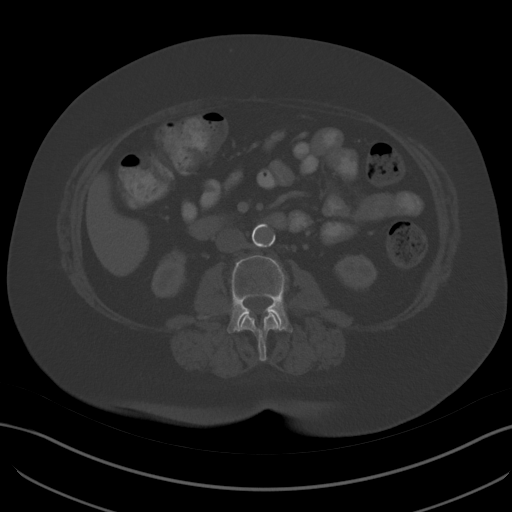
[im 62/89  soft-tissue]
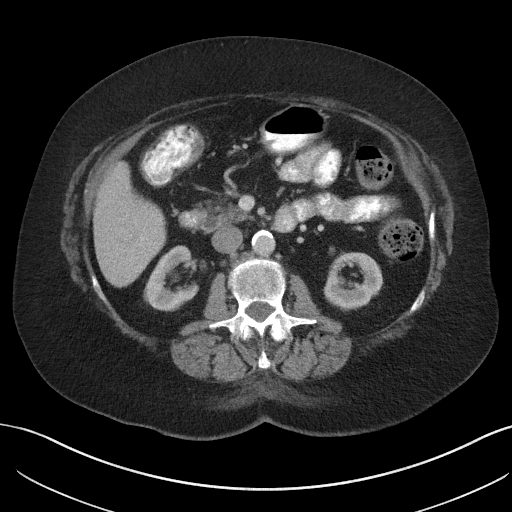
[im 71/89  soft-tissue]
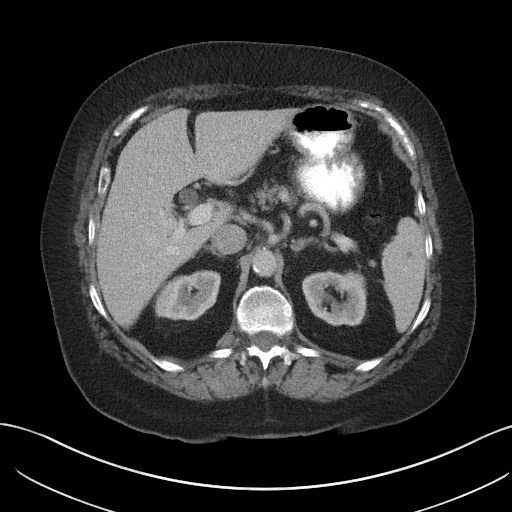
[im 75/89  soft-tissue]
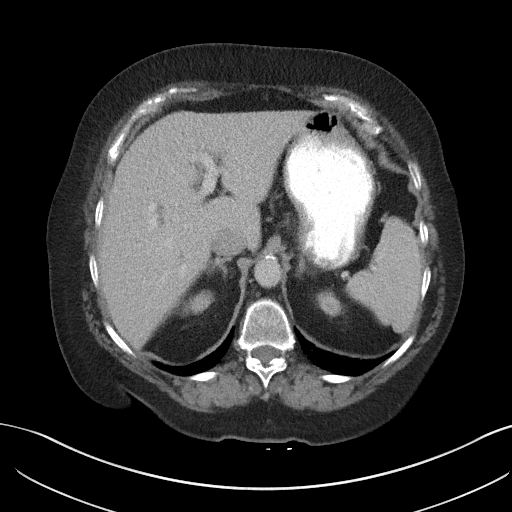
[im 84/89  soft-tissue]
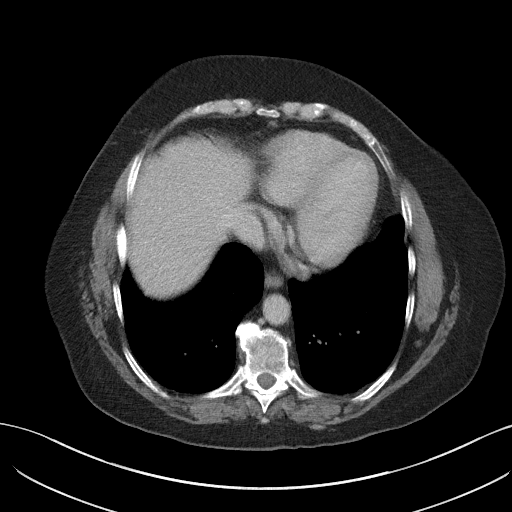

[Series 5: coronal st · coronal · 0.69mm/px · 3 of 100 slices shown]
[im 34/100  soft-tissue]
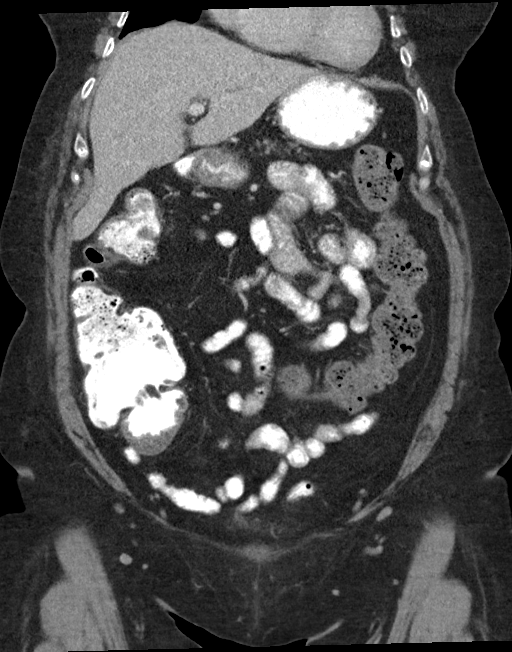
[im 45/100  soft-tissue]
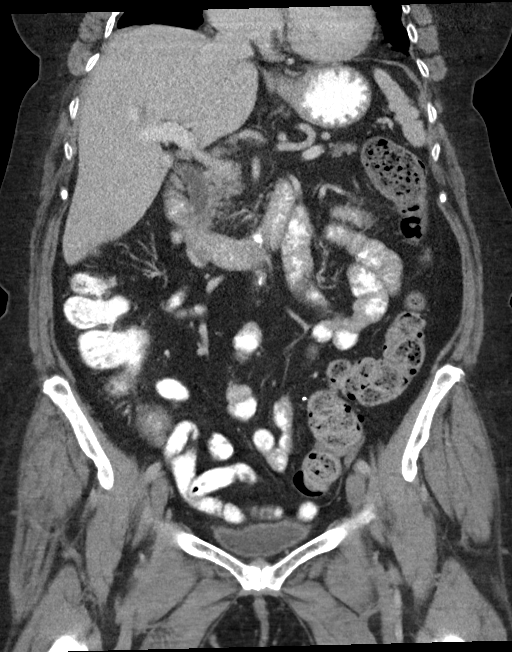
[im 56/100  soft-tissue]
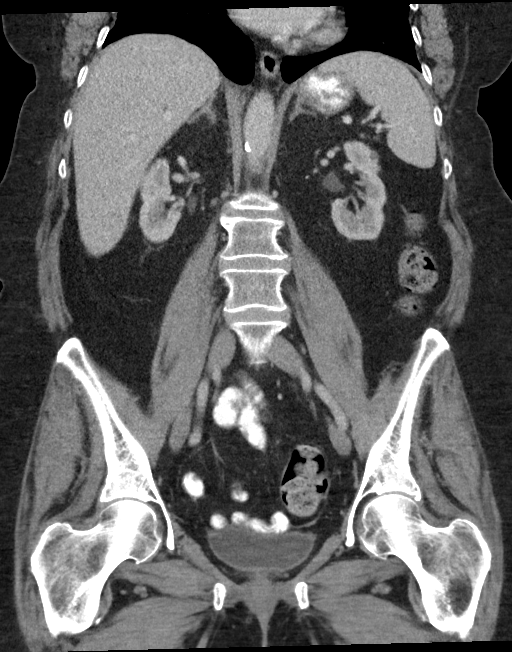

[16 of 46 positions shown; findings below may reference images not displayed]

FINDINGS: Lower Chest: No acute findings.

Hepatobiliary: No hepatic masses identified. Prior cholecystectomy.
No evidence of biliary obstruction.

Pancreas:  No mass or inflammatory changes.

Spleen: Within normal limits in size. Tiny sub-cm low-attenuation
lesions too small to characterize but most likely represents a tiny
cyst.

Adrenals/Urinary Tract: No masses identified. No evidence of
hydronephrosis.

Stomach/Bowel: No evidence of obstruction, inflammatory process or
abnormal fluid collections. No masses identified. Normal appendix
visualized. Moderate to large amount of stool is seen throughout the
colon, similar to previous exam.

Vascular/Lymphatic: No pathologically enlarged lymph nodes. No
abdominal aortic aneurysm. Aortic atherosclerosis.

Reproductive: Prior hysterectomy noted. Adnexal regions are
unremarkable in appearance.

Other: Stable tiny paraumbilical ventral hernia, which contains only
fat. Small suprapubic ventral hernia which contains only fat is also
stable.

Musculoskeletal:  No suspicious bone lesions identified.
IMPRESSION: 1. No acute findings. No evidence of recurrent or metastatic
carcinoma within the abdomen or pelvis.
2. Stable small fat-containing ventral hernias.

Aortic Atherosclerosis (M902I-[id number].A).

## 2020-09-24 NOTE — Assessment & Plan Note (Signed)
Continue crestor.  Low cholesterol diet and exercise. Follow lipid panel and liver function tests.   

## 2020-09-24 NOTE — Assessment & Plan Note (Signed)
Blood pressure doing well.  Continue amlodipine.  Follow metabolic panel.

## 2020-09-24 NOTE — Assessment & Plan Note (Signed)
On citalopram now.  Stable.  Taking xanax prn.  Follow.

## 2020-09-24 NOTE — Assessment & Plan Note (Signed)
Minimal acid reflux reported.  Refill prilosec.

## 2020-09-24 NOTE — Assessment & Plan Note (Signed)
On advair now.  Off trelegy. Plans to restart singulair.  Breathing stable.  No increased sob.

## 2020-09-24 NOTE — Assessment & Plan Note (Signed)
Recheck bone density.  Currently not on treatment.

## 2020-09-24 NOTE — Assessment & Plan Note (Signed)
Has been evaluated by [facility]. Continue risk factor modification.

## 2020-09-24 NOTE — Assessment & Plan Note (Signed)
Saw [facility] 07/2016 - carotid ultrasound - <30% bilateral.  Recommended f/u prn.

## 2020-09-24 NOTE — Assessment & Plan Note (Signed)
Low carb diet and exercise.  Follow met b and a1c.  

## 2020-09-24 NOTE — Assessment & Plan Note (Signed)
Slight decreased hgb on recent check.  Have her complete her abx.  Follow symptoms.  Plan for f/u cbc and iron studies.

## 2020-09-24 NOTE — Assessment & Plan Note (Signed)
Followed by neurology.  On sinemet

## 2020-09-24 NOTE — Assessment & Plan Note (Signed)
Avoid antiinflammatories.  Follow metabolic panel.  

## 2020-09-24 NOTE — Assessment & Plan Note (Signed)
Recent liver panel wnl.

## 2020-09-27 ENCOUNTER — Encounter: Payer: Self-pay | Admitting: Pulmonary Disease

## 2020-09-28 ENCOUNTER — Ambulatory Visit: Payer: Medicare Other | Admitting: Cardiovascular Disease

## 2020-10-05 ENCOUNTER — Other Ambulatory Visit (INDEPENDENT_AMBULATORY_CARE_PROVIDER_SITE_OTHER): Payer: Medicare Other

## 2020-10-05 ENCOUNTER — Other Ambulatory Visit: Payer: Self-pay

## 2020-10-05 DIAGNOSIS — D649 Anemia, unspecified: Secondary | ICD-10-CM

## 2020-10-05 DIAGNOSIS — M810 Age-related osteoporosis without current pathological fracture: Secondary | ICD-10-CM | POA: Diagnosis not present

## 2020-10-05 DIAGNOSIS — N183 Chronic kidney disease, stage 3 unspecified: Secondary | ICD-10-CM

## 2020-10-05 LAB — CBC WITH DIFFERENTIAL/PLATELET
Basophils Absolute: 0.1 10*3/uL (ref 0.0–0.1)
Basophils Relative: 0.5 % (ref 0.0–3.0)
Eosinophils Absolute: 0.2 10*3/uL (ref 0.0–0.7)
Eosinophils Relative: 1.4 % (ref 0.0–5.0)
HCT: 34.3 % — ABNORMAL LOW (ref 36.0–46.0)
Hemoglobin: 11.4 g/dL — ABNORMAL LOW (ref 12.0–15.0)
Lymphocytes Relative: 16.6 % (ref 12.0–46.0)
Lymphs Abs: 1.7 10*3/uL (ref 0.7–4.0)
MCHC: 33.1 g/dL (ref 30.0–36.0)
MCV: 89.2 fl (ref 78.0–100.0)
Monocytes Absolute: 0.6 10*3/uL (ref 0.1–1.0)
Monocytes Relative: 6 % (ref 3.0–12.0)
Neutro Abs: 7.9 10*3/uL — ABNORMAL HIGH (ref 1.4–7.7)
Neutrophils Relative %: 75.5 % (ref 43.0–77.0)
Platelets: 227 10*3/uL (ref 150.0–400.0)
RBC: 3.85 Mil/uL — ABNORMAL LOW (ref 3.87–5.11)
RDW: 14 % (ref 11.5–15.5)
WBC: 10.5 10*3/uL (ref 4.0–10.5)

## 2020-10-05 LAB — BASIC METABOLIC PANEL
BUN: 19 mg/dL (ref 6–23)
CO2: 25 mEq/L (ref 19–32)
Calcium: 9 mg/dL (ref 8.4–10.5)
Chloride: 105 mEq/L (ref 96–112)
Creatinine, Ser: 1.1 mg/dL (ref 0.40–1.20)
GFR: 50.78 mL/min — ABNORMAL LOW (ref >60.00)
Glucose, Bld: 92 mg/dL (ref 70–99)
Potassium: 4.3 mEq/L (ref 3.5–5.1)
Sodium: 139 mEq/L (ref 135–145)

## 2020-10-05 LAB — IBC + FERRITIN
Ferritin: 29.7 ng/mL (ref 10.0–291.0)
Iron: 47 ug/dL (ref 42–145)
Saturation Ratios: 16.5 % — ABNORMAL LOW (ref 20.0–50.0)
Transferrin: 204 mg/dL — ABNORMAL LOW (ref 212.0–360.0)

## 2020-10-05 LAB — VITAMIN D 25 HYDROXY (VIT D DEFICIENCY, FRACTURES): VITD: 28.44 ng/mL — ABNORMAL LOW (ref 30.00–100.00)

## 2020-10-07 ENCOUNTER — Other Ambulatory Visit: Payer: Self-pay | Admitting: Internal Medicine

## 2020-10-07 DIAGNOSIS — D509 Iron deficiency anemia, unspecified: Secondary | ICD-10-CM

## 2020-10-07 NOTE — Progress Notes (Signed)
Order placed GI referral.

## 2020-10-08 IMAGING — CR DG CHEST 2V
1 series · 2 of 2 positions shown · non-contrast
Comparison: 04/04/2018

CLINICAL DATA: Shortness of breath

EXAM:
CHEST - 2 VIEW

[Series 1: dg chest 2 view · 0.14mm/px · 2 of 2 slices shown]
[im 1/2]
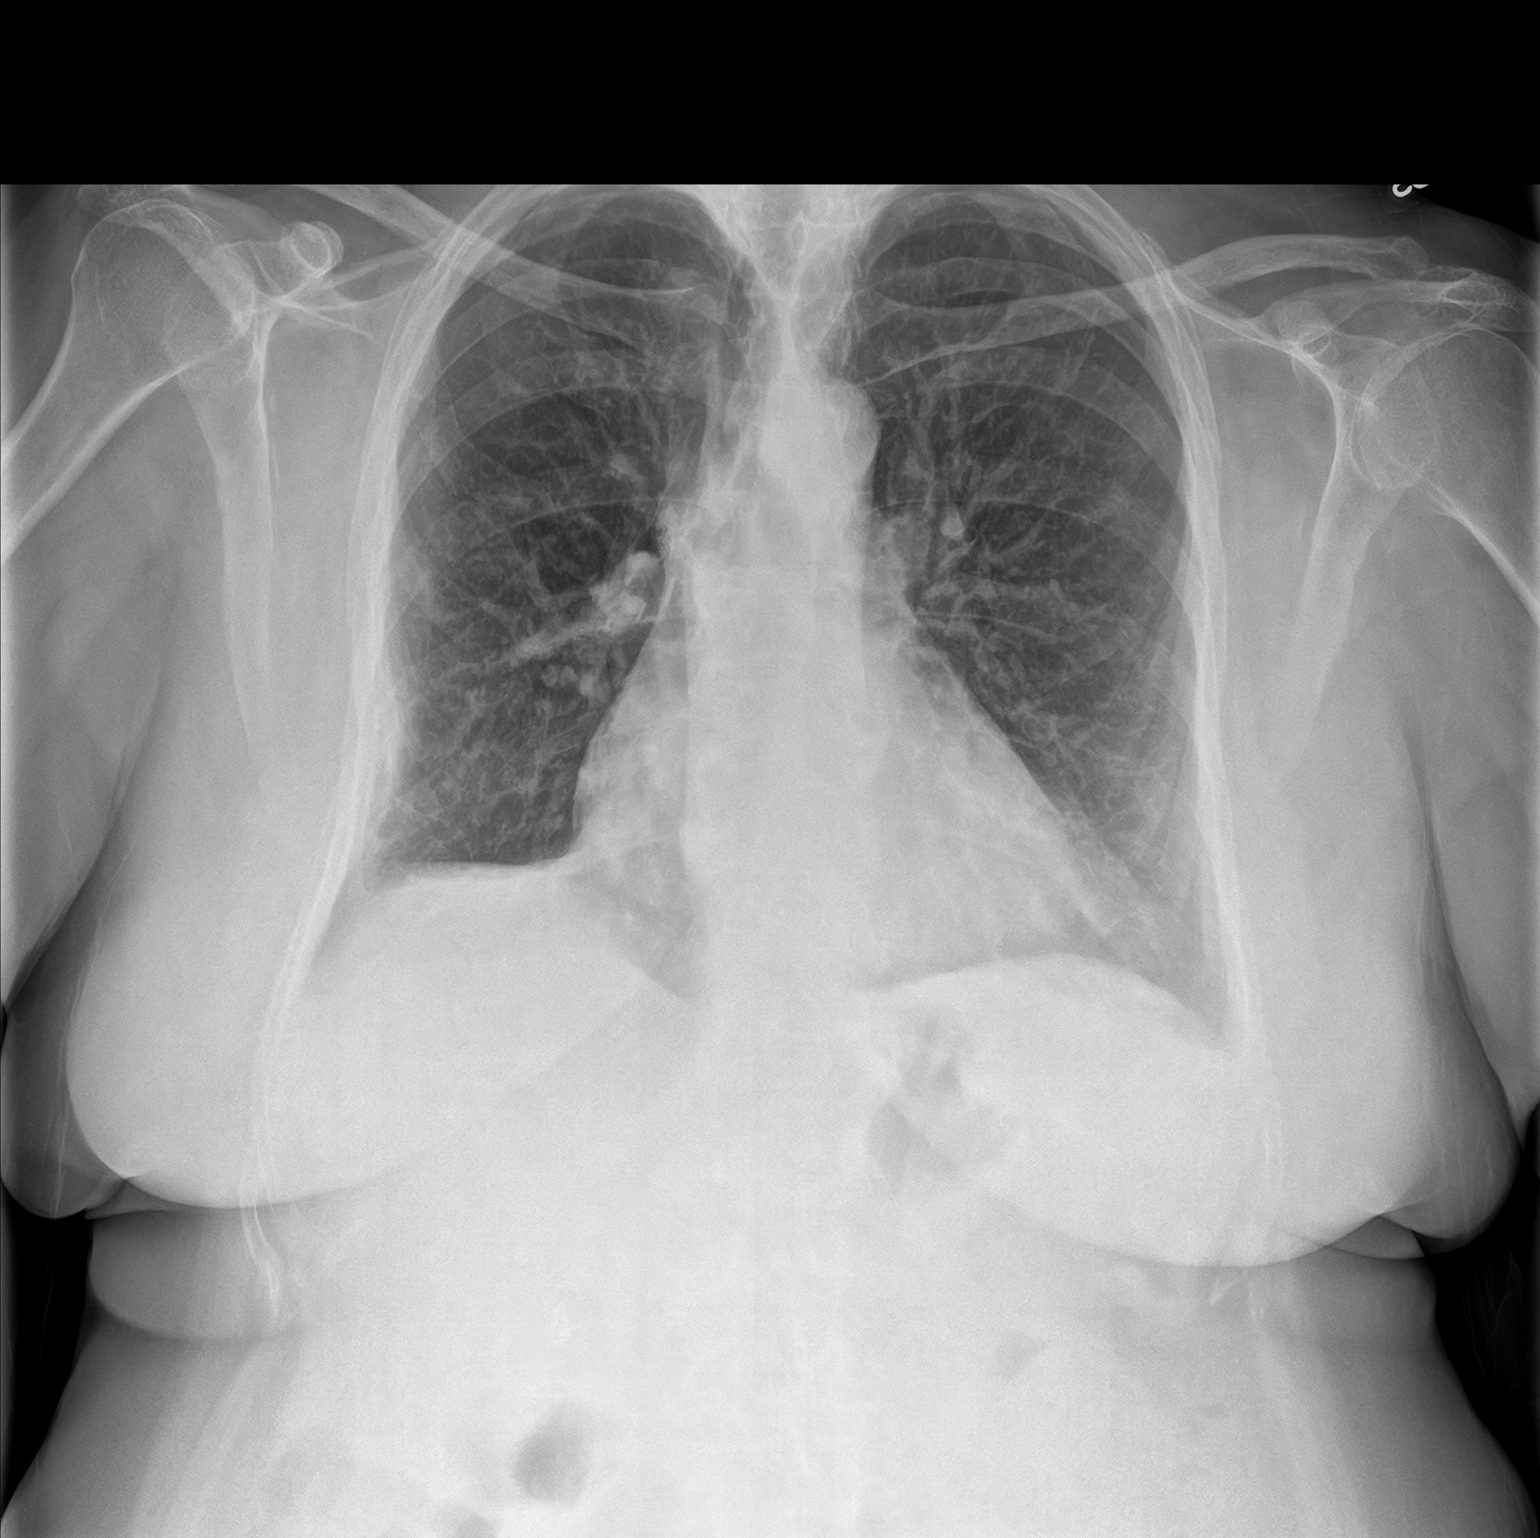
[im 2/2]
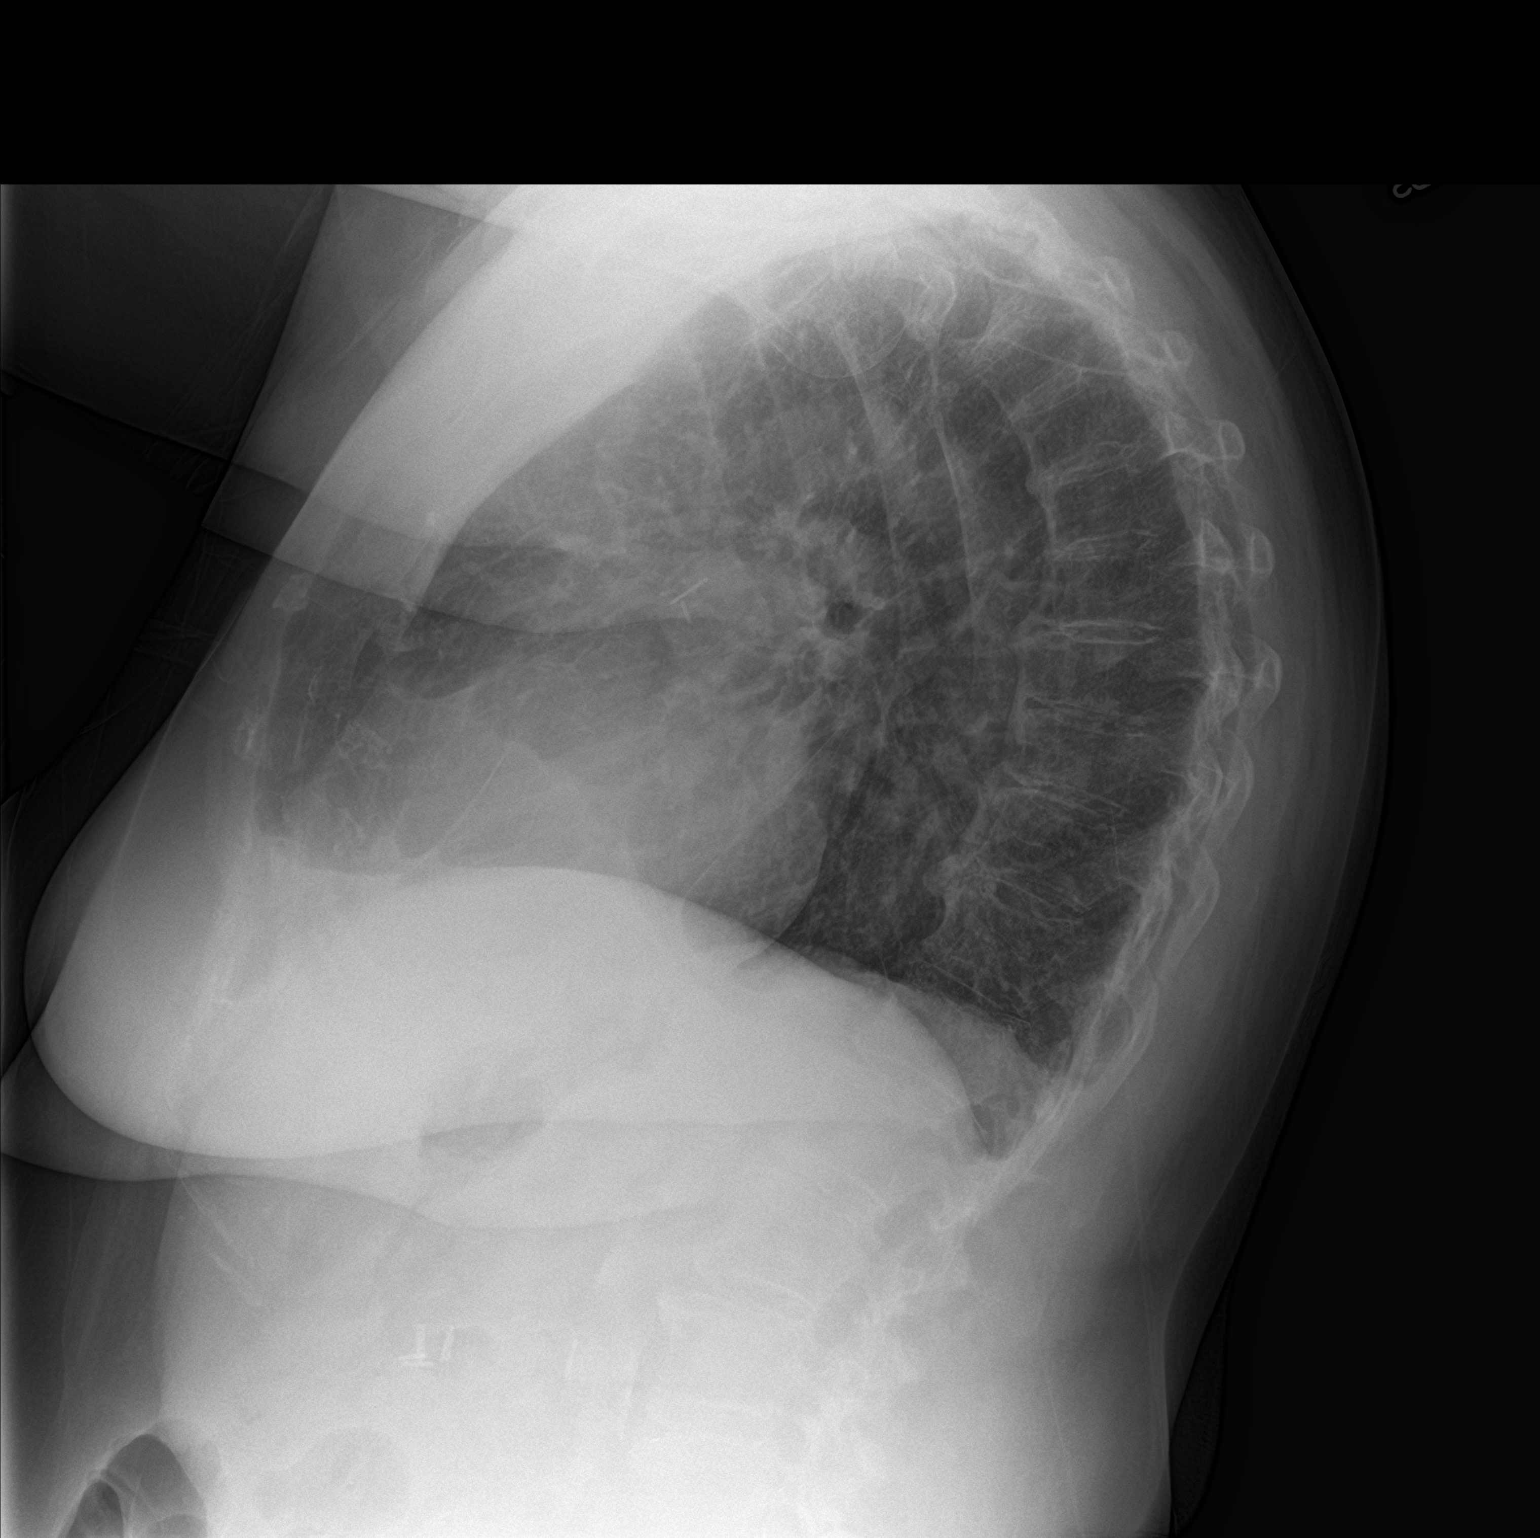

[2 of 2 positions shown; findings below may reference images not displayed]

FINDINGS: No focal consolidation or pleural effusion. Stable cardiomediastinal
silhouette with borderline cardiomegaly. Clips at the right hilus.
Mild pleural scarring at the CP angles. No pneumothorax.
IMPRESSION: No active cardiopulmonary disease. Stable pleural scarring at the CP
angles and right lower lateral lung.

## 2020-10-12 ENCOUNTER — Telehealth: Payer: Self-pay | Admitting: Internal Medicine

## 2020-10-12 NOTE — Telephone Encounter (Signed)
Patient aware that referral has been placed and being procssed. She will call back if she has not heard anything by next week

## 2020-10-12 NOTE — Telephone Encounter (Signed)
Patient called in stated that she has not heard anything from [facility] about gastero appointment she called them and they stated that they did not have any information

## 2020-10-17 ENCOUNTER — Other Ambulatory Visit: Payer: Self-pay | Admitting: Internal Medicine

## 2020-10-21 IMAGING — DX DG FOOT COMPLETE 3+V*R*
3 series · 3 of 3 positions shown · non-contrast
Comparison: None.

CLINICAL DATA: Achilles tendon pain.

EXAM:
RIGHT FOOT COMPLETE - 3+ VIEW

[foot ap]
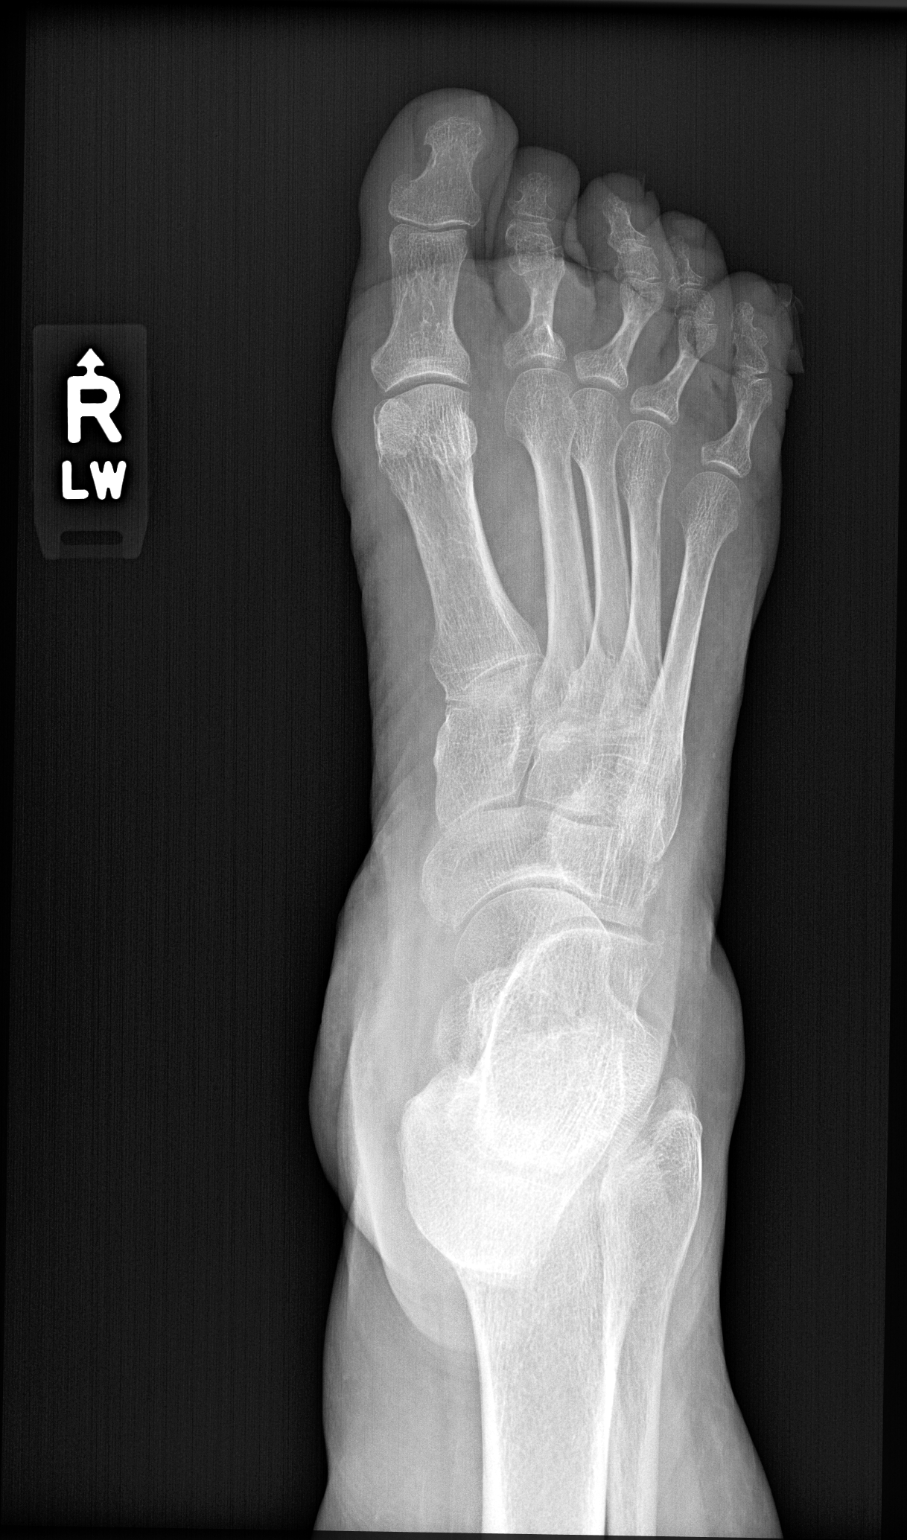

[foot obl (oblique)]
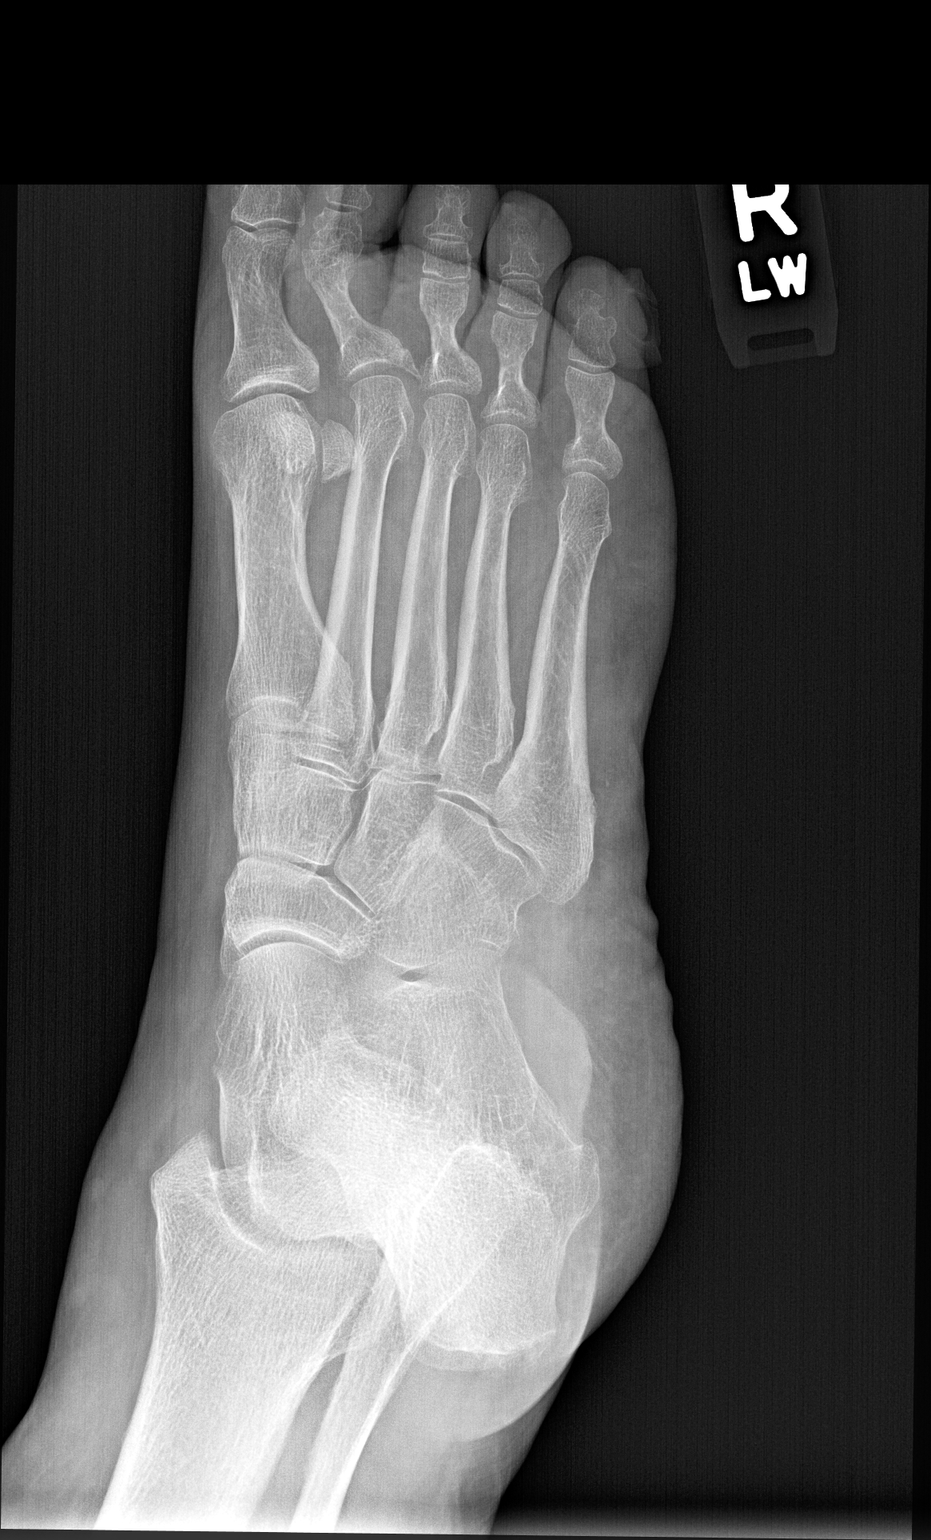

[foot lat]
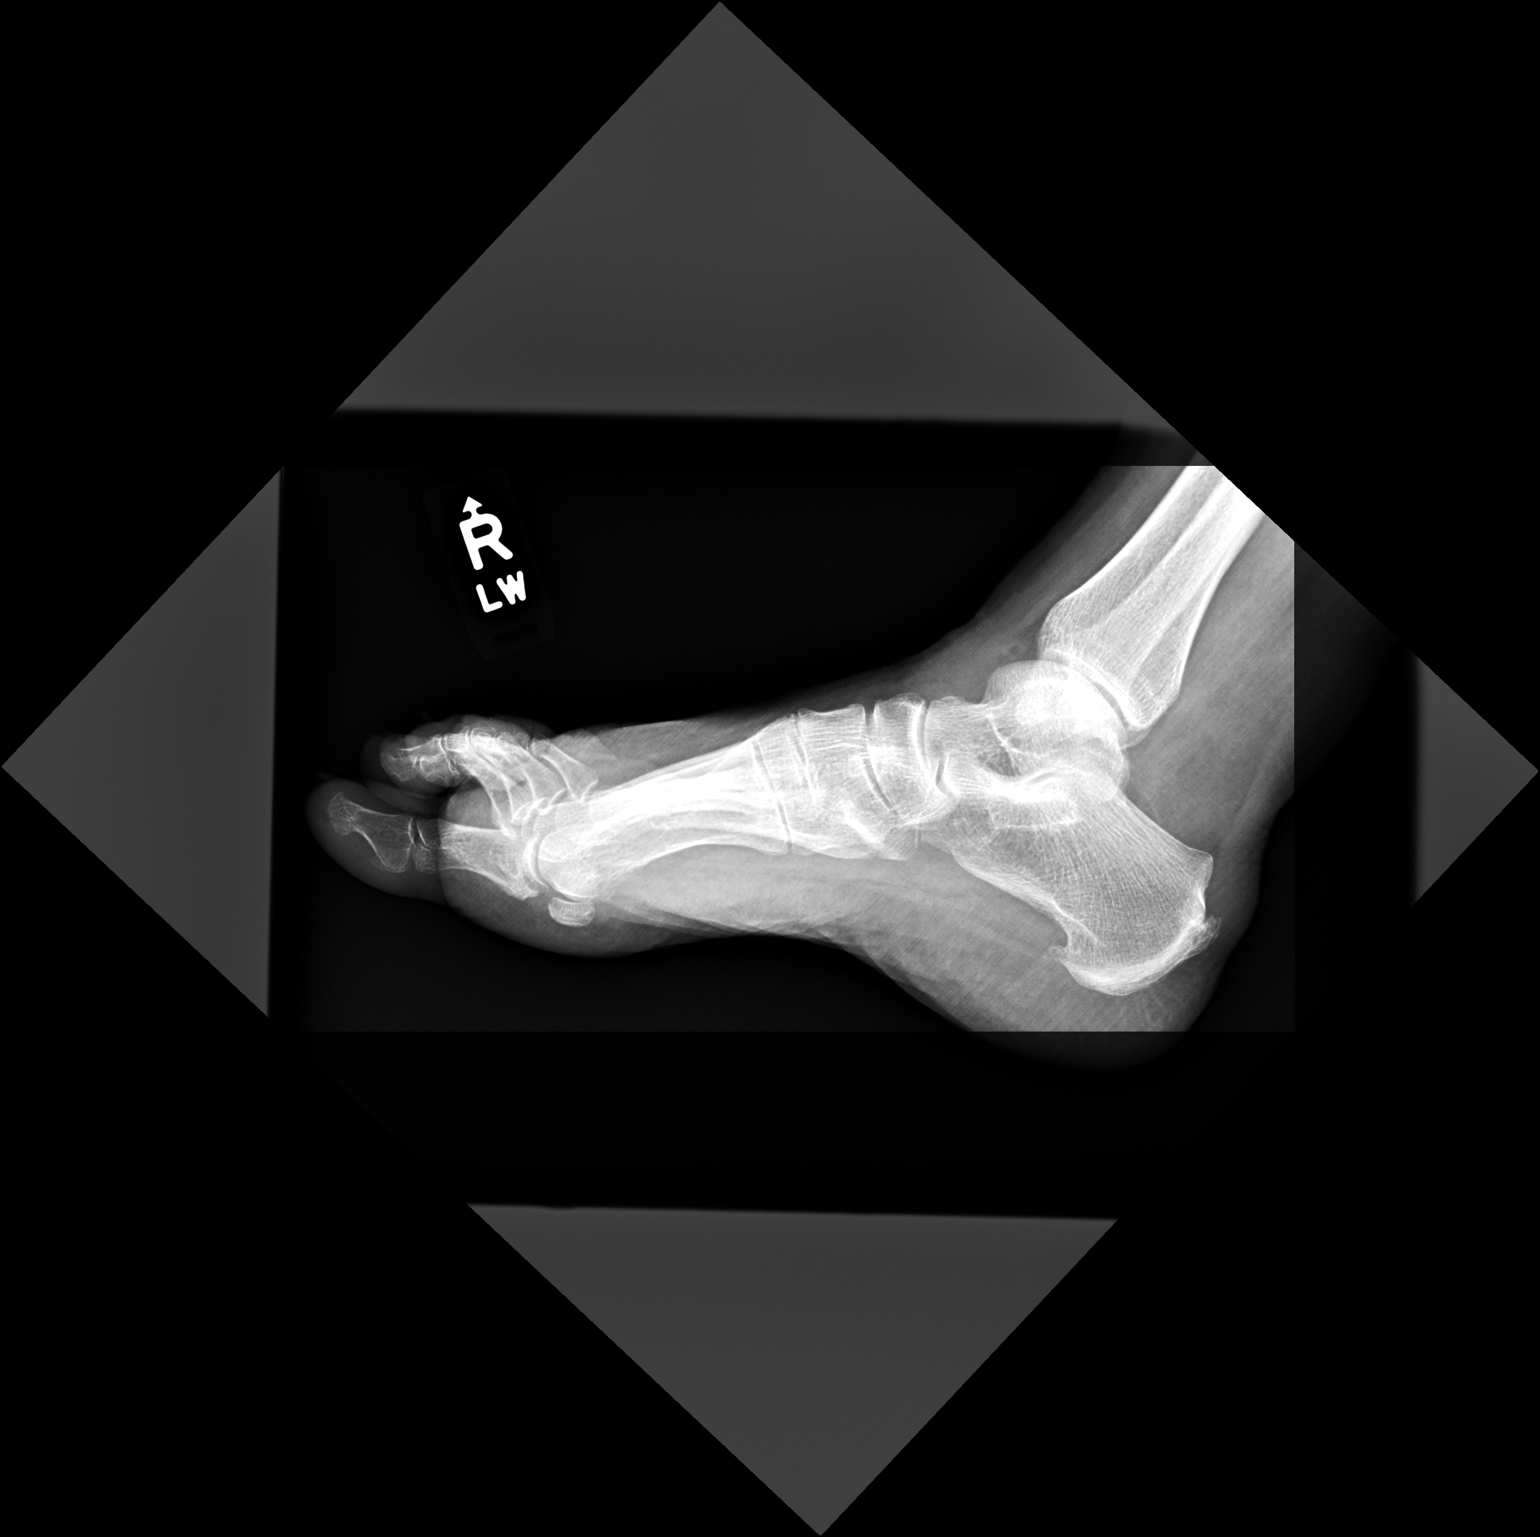

[3 of 3 positions shown; findings below may reference images not displayed]

FINDINGS: There is slight soft tissue swelling at the distal Achilles tendon
with a Haglund deformity at the Achilles attachment. Small plantar
calcaneal spur.

No fractures or dislocations. No significant arthritis. Small
osteophyte at the base of the proximal phalanx of the second toe.
IMPRESSION: Slight soft tissue swelling at the distal Achilles tendon with a
Haglund deformity at the Achilles attachment. The findings are
consistent with chronic Achilles tendinopathy.

## 2020-10-25 ENCOUNTER — Ambulatory Visit: Payer: Medicare Other | Admitting: Dermatology

## 2020-10-26 ENCOUNTER — Other Ambulatory Visit: Payer: Self-pay | Admitting: Internal Medicine

## 2020-10-26 DIAGNOSIS — Z1231 Encounter for screening mammogram for malignant neoplasm of breast: Secondary | ICD-10-CM

## 2020-11-04 ENCOUNTER — Telehealth (INDEPENDENT_AMBULATORY_CARE_PROVIDER_SITE_OTHER): Payer: Medicare Other | Admitting: Internal Medicine

## 2020-11-04 ENCOUNTER — Encounter: Payer: Self-pay | Admitting: Internal Medicine

## 2020-11-04 DIAGNOSIS — J449 Chronic obstructive pulmonary disease, unspecified: Secondary | ICD-10-CM

## 2020-11-04 DIAGNOSIS — K219 Gastro-esophageal reflux disease without esophagitis: Secondary | ICD-10-CM

## 2020-11-04 DIAGNOSIS — D649 Anemia, unspecified: Secondary | ICD-10-CM | POA: Diagnosis not present

## 2020-11-04 DIAGNOSIS — I779 Disorder of arteries and arterioles, unspecified: Secondary | ICD-10-CM

## 2020-11-04 DIAGNOSIS — G20A1 Parkinson's disease without dyskinesia, without mention of fluctuations: Secondary | ICD-10-CM

## 2020-11-04 DIAGNOSIS — I10 Essential (primary) hypertension: Secondary | ICD-10-CM

## 2020-11-04 DIAGNOSIS — Z85038 Personal history of other malignant neoplasm of large intestine: Secondary | ICD-10-CM

## 2020-11-04 DIAGNOSIS — R739 Hyperglycemia, unspecified: Secondary | ICD-10-CM

## 2020-11-04 DIAGNOSIS — G20 Parkinson's disease: Secondary | ICD-10-CM | POA: Diagnosis not present

## 2020-11-04 DIAGNOSIS — N183 Chronic kidney disease, stage 3 unspecified: Secondary | ICD-10-CM | POA: Diagnosis not present

## 2020-11-04 DIAGNOSIS — I700 Atherosclerosis of aorta: Secondary | ICD-10-CM

## 2020-11-04 DIAGNOSIS — I773 Arterial fibromuscular dysplasia: Secondary | ICD-10-CM

## 2020-11-04 DIAGNOSIS — F339 Major depressive disorder, recurrent, unspecified: Secondary | ICD-10-CM

## 2020-11-04 DIAGNOSIS — E7800 Pure hypercholesterolemia, unspecified: Secondary | ICD-10-CM | POA: Diagnosis not present

## 2020-11-04 DIAGNOSIS — E559 Vitamin D deficiency, unspecified: Secondary | ICD-10-CM

## 2020-11-04 NOTE — Progress Notes (Addendum)
Patient ID: Victoria Hanson, female   DOB: 1951-05-24, 70 y.o.   MRN: [id number]   Virtual Visit via telephone Note  This visit type was conducted due to national recommendations for restrictions regarding the COVID-19 pandemic (e.g. social distancing).  This format is felt to be most appropriate for this patient at this time.  All issues noted in this document were discussed and addressed.  No physical exam was performed (except for noted visual exam findings with Video Visits).   I connected with Rolm Baptise by telephone and verified that I am speaking with the correct person using two identifiers. Location patient: home Location provider: work  Persons participating in the telephone visit: patient, provider  The limitations, risks, security and privacy concerns of performing an evaluation and management service by telephone and the availability of in person appointments have been discussed.  It has also been discussed with the patient that there may be a patient responsible charge related to this service. The patient expressed understanding and agreed to proceed.   Reason for visit: scheduled follow up  HPI: Follow up regarding her blood pressure, lower extremity swelling and anxiety.  Overall appears to be doing better.  Tries to stay active.  Overall appears to be handling stress relatively well.  Back on citrlopram. Appears to be doing better.  No chest pain.  Breathing stable.  No acid reflux reported.  No abdominal pain.  Bowels moving.  Discussed recent lab results.  Scheduled to have her teeth pulled next week.  Notices some feet and leg swelling - intermittent.  Overall stable.  Eating.  Discussed decreased salt intake.  Discussed staying hydrated.  Taking vitamin D supplements.     ROS: See pertinent positives and negatives per HPI.  Past Medical History:  Diagnosis Date  . Anxiety   . Asthma   . Atypical chest pain    a. 2003 Cath: reportedly nl per pt [name]);  b.  04/2015 [facility] Admission, neg troponin.  . Colon cancer (HCC) 1999   a. 1999: Pt had partial colectomy. Did develop lung metastasis. Had right upper lobectomy in 01/2007 then had associated chemotherapy.  Marland Kitchen COPD (chronic obstructive pulmonary disease) (HCC)   . DDD (degenerative disc disease), lumbosacral   . Depression   . Echocardiogram abnormal    a. 02/2010 Echo: EF 55-60%, mild LAE, no regional wall motion abnormalities  . Elevated transaminase level    a. ? NASH;  b. 06/2015 - nl LFTs.  . GERD (gastroesophageal reflux disease)   . Hematuria   . Holter monitor, abnormal    a. 12/2009 Holter monitoring: NSR, rare PACs and PVCs.  Marland Kitchen Hypercholesterolemia   . Hyperlipidemia   . Hypertension   . Leaky heart valve    a. Per pt report - no evidence of valvular abnormalities on prior echoes.  . Lesion of skin of Right breast 10/20/2017  . Lung metastases (HCC) 2008   a. S/P resection (lobectomy - right)  . Morbid obesity (HCC)   . Nephrolithiasis   . Parkinson disease (HCC) 08/2016   [name]    Past Surgical History:  Procedure Laterality Date  . BREAST EXCISIONAL BIOPSY Right 10/07/2009   benign  . CARDIAC CATHETERIZATION  2003   negative as per pt report  . CHOLECYSTECTOMY  2000  . COLON SURGERY     Colon cancer, removed part of large colon  . COLONOSCOPY N/A 04/25/2015   Procedure: COLONOSCOPY;  Surgeon: Scot Jun, MD;  Location: [facility] ENDOSCOPY;  Service: Endoscopy;  Laterality: N/A;  . COLONOSCOPY WITH PROPOFOL N/A 06/14/2017   Procedure: COLONOSCOPY WITH PROPOFOL;  Surgeon: Manya Silvas, MD;  Location: [facility] ENDOSCOPY;  Service: Endoscopy;  Laterality: N/A;  . COLONOSCOPY WITH PROPOFOL N/A 11/17/2019   Procedure: COLONOSCOPY WITH PROPOFOL;  Surgeon: [name], MD;  Location: [facility] ENDOSCOPY;  Service: Gastroenterology;  Laterality: N/A;  . LOBECTOMY  01/2007   Right lung, upper lobe right side removed  . TOTAL ABDOMINAL HYSTERECTOMY     abnormal bleeding  . TUBAL  LIGATION  1980    Family History  Problem Relation Age of Onset  . Coronary artery disease Mother        died @ 48 of MI.  . Mental illness Mother   . Diabetes Mother   . Heart attack Father 12       MI  . Cancer Father        lung - died in his mid-70's  . Hypertension Father   . Diabetes Father   . Sudden death Brother   . Mental illness Brother   . Heart attack Brother        MI @ 60, died @ age 56.  . Breast cancer Neg Hx     SOCIAL HX: reviewed.    Current Outpatient Medications:  .  ALPRAZolam (XANAX) 0.5 MG tablet, TAKE 1/2 TABLET BY MOUTH DAILY AS NEEDED, Disp: 30 tablet, Rfl: 0 .  amLODipine (NORVASC) 2.5 MG tablet, Take 1 tablet (2.5 mg total) by mouth daily., Disp: 90 tablet, Rfl: 1 .  aspirin EC 81 MG tablet, Take 81 mg by mouth as needed. , Disp: , Rfl:  .  calcium-vitamin D (OSCAL WITH D) 500-200 MG-UNIT tablet, Take 1 tablet by mouth., Disp: , Rfl:  .  carbidopa-levodopa (SINEMET) 25-100 MG tablet, Take 1.5 in am and one at 11:00 and one in afternoon and 1.5 at night., Disp: 150 tablet, Rfl: 0 .  citalopram (CELEXA) 10 MG tablet, Take 1 tablet (10 mg total) by mouth daily., Disp: 30 tablet, Rfl: 1 .  fluticasone-salmeterol (ADVAIR HFA) 115-21 MCG/ACT inhaler, Inhale 2 puffs into the lungs 2 (two) times daily., Disp: 1 Inhaler, Rfl: 4 .  furosemide (LASIX) 20 MG tablet, TAKE 1 TABLET BY MOUTH DAILY, Disp: 30 tablet, Rfl: 0 .  montelukast (SINGULAIR) 10 MG tablet, Take 1 tablet (10 mg total) by mouth at bedtime., Disp: 30 tablet, Rfl: 2 .  omeprazole (PRILOSEC) 20 MG capsule, Take 1 capsule (20 mg total) by mouth daily., Disp: 90 capsule, Rfl: 1 .  PROAIR HFA 108 (90 Base) MCG/ACT inhaler, INHALE 2 PUFFS BY MOUTH INTO THE LUNGS EVERY 6 HOURS AS NEEDED FOR WHEEZING, Disp: 8.5 g, Rfl: 2 .  rosuvastatin (CRESTOR) 5 MG tablet, Take 1 tablet (5 mg total) by mouth every Monday, Wednesday, and Friday., Disp: 90 tablet, Rfl: 1 .  Spacer/Aero-Holding Chambers (EASIVENT)  inhaler, See admin instructions., Disp: , Rfl:  .  traZODone (DESYREL) 50 MG tablet, TAKE 2 TABLETS BY MOUTH AT BEDTIME, Disp: 180 tablet, Rfl: 1 .  vitamin B-12 (CYANOCOBALAMIN) 100 MCG tablet, Take 100 mcg by mouth daily., Disp: , Rfl:  .  fluticasone (FLONASE) 50 MCG/ACT nasal spray, Place 2 sprays into both nostrils daily., Disp: 11.1 mL, Rfl: 0  EXAM:  GENERAL: alert.  Sounds to be in no acute distress.  Answering questions appropriately.   PSYCH/NEURO: pleasant and cooperative, no obvious depression or anxiety, speech and thought processing grossly intact  ASSESSMENT AND PLAN:  Discussed the following assessment and plan:  Problem List Items Addressed This Visit    Anemia    Recent hgb slightly decreased.  Follow.  Keep f/u with GI.       Relevant Orders   CBC with Differential/Platelet   Vitamin B12   Ferritin   Aortic atherosclerosis (HCC)    Continue crestor.        Carotid artery disease ([place])    Evaluated by [facility].  Continue risk factor modification.        Chronic obstructive pulmonary disease ([place])    Continue singulair and advair.  Breathing stable.  Follow.       CKD (chronic kidney disease), stage III (HCC)    Avoid antiinflammatories.  Stay hydrated.  Follow metabolic panel.       Depression, recurrent ([place])    Appears to be doing better on citalopram.  Follow.       Fibromuscular dysplasia ([place])    Has been evaluated by [facility].  Continue risk factor modification.       GERD (gastroesophageal reflux disease)    Continue prilosec.  Controlled.       History of colon cancer    Last colonoscopy 11/2019.  Followed by GI.  Recent decreased hgb.  F/u with GI to confirm no further w/up is warranted.       Hyperglycemia    Low carb diet and exercise.  Follow met b and a1c.       Hypertension    Blood pressure doing well.  Continue amlodipine.  Follow pressures.  Follow metabolic panel.       Relevant Orders   Basic metabolic panel   TSH    Parkinson's disease ([place])    On sinemet.  Followed by neurology.       Pure hypercholesterolemia    Continue crestor.  Low cholesterol diet and exercise.  Follow lipid panel and liver function tests.        Relevant Orders   Lipid panel   Vitamin D deficiency    Vitamin D supplements as discussed.  Follow.          Return in about 6 weeks (around 12/16/2020) for follow up appt (55min).   I discussed the assessment and treatment plan with the patient. The patient was provided an opportunity to ask questions and all were answered. The patient agreed with the plan and demonstrated an understanding of the instructions.   The patient was advised to call back or seek an in-person evaluation if the symptoms worsen or if the condition fails to improve as anticipated.  I provided 22 minutes of non-face-to-face time during this encounter.   Einar Pheasant, MD

## 2020-11-05 ENCOUNTER — Encounter: Payer: Self-pay | Admitting: Internal Medicine

## 2020-11-05 DIAGNOSIS — E559 Vitamin D deficiency, unspecified: Secondary | ICD-10-CM | POA: Insufficient documentation

## 2020-11-05 DIAGNOSIS — I700 Atherosclerosis of aorta: Secondary | ICD-10-CM | POA: Insufficient documentation

## 2020-11-05 NOTE — Assessment & Plan Note (Signed)
On sinemet.  Followed by neurology.

## 2020-11-05 NOTE — Assessment & Plan Note (Signed)
Continue singulair and advair.  Breathing stable.  Follow.

## 2020-11-05 NOTE — Assessment & Plan Note (Signed)
Blood pressure doing well.  Continue amlodipine.  Follow pressures.  Follow metabolic panel.

## 2020-11-05 NOTE — Assessment & Plan Note (Signed)
Recent hgb slightly decreased.  Follow.  Keep f/u with GI.

## 2020-11-05 NOTE — Assessment & Plan Note (Signed)
Low carb diet and exercise.  Follow met b and a1c.

## 2020-11-05 NOTE — Assessment & Plan Note (Signed)
Appears to be doing better on citalopram.  Follow.

## 2020-11-05 NOTE — Assessment & Plan Note (Signed)
Last colonoscopy 11/2019.  Followed by GI.  Recent decreased hgb.  F/u with GI to confirm no further w/up is warranted.

## 2020-11-05 NOTE — Assessment & Plan Note (Signed)
Vitamin D supplements as discussed.  Follow.

## 2020-11-05 NOTE — Assessment & Plan Note (Signed)
Has been evaluated by [facility].  Continue risk factor modification.

## 2020-11-05 NOTE — Assessment & Plan Note (Signed)
Continue crestor.  Low cholesterol diet and exercise. Follow lipid panel and liver function tests.   

## 2020-11-05 NOTE — Assessment & Plan Note (Signed)
Avoid antiinflammatories.  Stay hydrated.  Follow metabolic panel.

## 2020-11-05 NOTE — Assessment & Plan Note (Signed)
Continue crestor 

## 2020-11-05 NOTE — Assessment & Plan Note (Signed)
Continue prilosec.  Controlled.

## 2020-11-05 NOTE — Assessment & Plan Note (Signed)
Evaluated by [facility].  Continue risk factor modification.

## 2020-11-08 ENCOUNTER — Other Ambulatory Visit: Payer: Medicare Other

## 2020-11-15 ENCOUNTER — Other Ambulatory Visit: Payer: Self-pay | Admitting: Internal Medicine

## 2020-11-15 NOTE — Telephone Encounter (Signed)
I refilled the alprazolam.  Need to know how she is taking lasix.  Received a request for refill of lasix, but need to clarify if taking daily (or how taking), before refilling.

## 2020-11-16 ENCOUNTER — Other Ambulatory Visit: Payer: Self-pay | Admitting: Internal Medicine

## 2020-11-16 MED ORDER — FUROSEMIDE 20 MG PO TABS: 20.0000 mg | ORAL_TABLET | Freq: Every day | ORAL | 0 refills | Status: DC | PRN

## 2020-11-16 NOTE — Progress Notes (Signed)
rx sent in for lasix #30 with no refills.

## 2020-11-16 NOTE — Telephone Encounter (Signed)
Patient is taking 1 tablet about 3 times a week. Is not using daily

## 2020-11-21 ENCOUNTER — Telehealth: Payer: Self-pay

## 2020-11-21 ENCOUNTER — Other Ambulatory Visit: Payer: Self-pay

## 2020-11-21 ENCOUNTER — Other Ambulatory Visit (INDEPENDENT_AMBULATORY_CARE_PROVIDER_SITE_OTHER): Payer: Medicare Other

## 2020-11-21 DIAGNOSIS — R300 Dysuria: Secondary | ICD-10-CM | POA: Diagnosis not present

## 2020-11-21 LAB — URINALYSIS, ROUTINE W REFLEX MICROSCOPIC
Bilirubin Urine: NEGATIVE
Ketones, ur: NEGATIVE
Nitrite: NEGATIVE
Specific Gravity, Urine: <=1.005 — AB (ref 1.000–1.030)
Total Protein, Urine: NEGATIVE
Urine Glucose: NEGATIVE
Urobilinogen, UA: 0.2 (ref 0.0–1.0)
pH: 6.5 (ref 5.0–8.0)

## 2020-11-21 NOTE — Addendum Note (Signed)
Addended by: Lars Masson on: 11/21/2020 01:01 PM   Modules accepted: Orders

## 2020-11-21 NOTE — Telephone Encounter (Signed)
Called patient. She is having cloudy dark urine, painful urination, noticed a little bit of blood one time yesterday. No abdominal pain, back pain, fever, nausea, vomiting, etc. She is going to come in this afternoon and leave a urine sample and do a telephone call with Dr Nicki Reaper on 6/14 at 4:00.

## 2020-11-21 NOTE — Telephone Encounter (Signed)
Pt called in again to report UTI symtpoms

## 2020-11-21 NOTE — Telephone Encounter (Signed)
Pt called in to [facility] to report a UTI.

## 2020-11-22 ENCOUNTER — Telehealth (INDEPENDENT_AMBULATORY_CARE_PROVIDER_SITE_OTHER): Payer: Medicare Other | Admitting: Internal Medicine

## 2020-11-22 ENCOUNTER — Ambulatory Visit (INDEPENDENT_AMBULATORY_CARE_PROVIDER_SITE_OTHER): Payer: Medicare Other

## 2020-11-22 ENCOUNTER — Encounter: Payer: Self-pay | Admitting: Internal Medicine

## 2020-11-22 VITALS — Ht 63.0 in | Wt 200.0 lb

## 2020-11-22 DIAGNOSIS — K219 Gastro-esophageal reflux disease without esophagitis: Secondary | ICD-10-CM

## 2020-11-22 DIAGNOSIS — R739 Hyperglycemia, unspecified: Secondary | ICD-10-CM

## 2020-11-22 DIAGNOSIS — D649 Anemia, unspecified: Secondary | ICD-10-CM

## 2020-11-22 DIAGNOSIS — I779 Disorder of arteries and arterioles, unspecified: Secondary | ICD-10-CM | POA: Diagnosis not present

## 2020-11-22 DIAGNOSIS — Z0000 Encounter for general adult medical examination without abnormal findings: Secondary | ICD-10-CM

## 2020-11-22 DIAGNOSIS — I10 Essential (primary) hypertension: Secondary | ICD-10-CM | POA: Diagnosis not present

## 2020-11-22 DIAGNOSIS — N3000 Acute cystitis without hematuria: Secondary | ICD-10-CM

## 2020-11-22 DIAGNOSIS — G20 Parkinson's disease: Secondary | ICD-10-CM

## 2020-11-22 DIAGNOSIS — J449 Chronic obstructive pulmonary disease, unspecified: Secondary | ICD-10-CM | POA: Diagnosis not present

## 2020-11-22 DIAGNOSIS — I773 Arterial fibromuscular dysplasia: Secondary | ICD-10-CM

## 2020-11-22 DIAGNOSIS — G20A1 Parkinson's disease without dyskinesia, without mention of fluctuations: Secondary | ICD-10-CM

## 2020-11-22 DIAGNOSIS — N183 Chronic kidney disease, stage 3 unspecified: Secondary | ICD-10-CM

## 2020-11-22 DIAGNOSIS — E7800 Pure hypercholesterolemia, unspecified: Secondary | ICD-10-CM | POA: Diagnosis not present

## 2020-11-22 DIAGNOSIS — F339 Major depressive disorder, recurrent, unspecified: Secondary | ICD-10-CM

## 2020-11-22 DIAGNOSIS — I700 Atherosclerosis of aorta: Secondary | ICD-10-CM | POA: Diagnosis not present

## 2020-11-22 MED ORDER — SULFAMETHOXAZOLE-TRIMETHOPRIM 800-160 MG PO TABS: 1.0000 | ORAL_TABLET | Freq: Two times a day (BID) | ORAL | 0 refills | Status: DC

## 2020-11-22 NOTE — Patient Instructions (Addendum)
Victoria Hanson , Thank you for taking time to come for your Medicare Wellness Visit. I appreciate your ongoing commitment to your health goals. Please review the following plan we discussed and let me know if I can assist you in the future.   These are the goals we discussed:  Goals       Patient Stated     Healthy diet (pt-stated)      Low salt         This is a list of the screening recommended for you and due dates:  Health Maintenance  Topic Date Due   Mammogram  02/07/2019   COVID-19 Vaccine (3 - Moderna risk series) 12/08/2020*   Zoster (Shingles) Vaccine (1 of 2) 02/22/2021*   Flu Shot  01/09/2021   Colon Cancer Screening  11/16/2024   Tetanus Vaccine  01/07/2030   DEXA scan (bone density measurement)  Completed   Hepatitis C Screening: USPSTF Recommendation to screen - Ages 35-79 yo.  Completed   Pneumonia vaccines  Completed   HPV Vaccine  Aged Out  *Topic was postponed. The date shown is not the original due date.    Advanced directives: on file  Follow up in one year for your annual wellness visit    Preventive Care 65 Years and Older, Female Preventive care refers to lifestyle choices and visits with your health care provider that can promote health and wellness. What does preventive care include? A yearly physical exam. This is also called an annual well check. Dental exams once or twice a year. Routine eye exams. Ask your health care provider how often you should have your eyes checked. Personal lifestyle choices, including: Daily care of your teeth and gums. Regular physical activity. Eating a healthy diet. Avoiding tobacco and drug use. Limiting alcohol use. Practicing safe sex. Taking low-dose aspirin every day. Taking vitamin and mineral supplements as recommended by your health care provider. What happens during an annual well check? The services and screenings done by your health care provider during your annual well check will depend on your age,  overall health, lifestyle risk factors, and family history of disease. Counseling  Your health care provider may ask you questions about your: Alcohol use. Tobacco use. Drug use. Emotional well-being. Home and relationship well-being. Sexual activity. Eating habits. History of falls. Memory and ability to understand (cognition). Work and work Statistician. Reproductive health. Screening  You may have the following tests or measurements: Height, weight, and BMI. Blood pressure. Lipid and cholesterol levels. These may be checked every 5 years, or more frequently if you are over 88 years old. Skin check. Lung cancer screening. You may have this screening every year starting at age 42 if you have a 30-pack-year history of smoking and currently smoke or have quit within the past 15 years. Fecal occult blood test (FOBT) of the stool. You may have this test every year starting at age 41. Flexible sigmoidoscopy or colonoscopy. You may have a sigmoidoscopy every 5 years or a colonoscopy every 10 years starting at age 36. Hepatitis C blood test. Hepatitis B blood test. Sexually transmitted disease (STD) testing. Diabetes screening. This is done by checking your blood sugar (glucose) after you have not eaten for a while (fasting). You may have this done every 1-3 years. Bone density scan. This is done to screen for osteoporosis. You may have this done starting at age 50. Mammogram. This may be done every 1-2 years. Talk to your health care provider about how often  you should have regular mammograms. Talk with your health care provider about your test results, treatment options, and if necessary, the need for more tests. Vaccines  Your health care provider may recommend certain vaccines, such as: Influenza vaccine. This is recommended every year. Tetanus, diphtheria, and acellular pertussis (Tdap, Td) vaccine. You may need a Td booster every 10 years. Zoster vaccine. You may need this after age  1. Pneumococcal 13-valent conjugate (PCV13) vaccine. One dose is recommended after age 23. Pneumococcal polysaccharide (PPSV23) vaccine. One dose is recommended after age 68. Talk to your health care provider about which screenings and vaccines you need and how often you need them. This information is not intended to replace advice given to you by your health care provider. Make sure you discuss any questions you have with your health care provider. Document Released: 06/24/2015 Document Revised: 02/15/2016 Document Reviewed: 03/29/2015 Elsevier Interactive Patient Education  2017 [employer] Prevention in the Home Falls can cause injuries. They can happen to people of all ages. There are many things you can do to make your home safe and to help prevent falls. What can I do on the outside of my home? Regularly fix the edges of walkways and driveways and fix any cracks. Remove anything that might make you trip as you walk through a door, such as a raised step or threshold. Trim any bushes or trees on the path to your home. Use bright outdoor lighting. Clear any walking paths of anything that might make someone trip, such as rocks or tools. Regularly check to see if handrails are loose or broken. Make sure that both sides of any steps have handrails. Any raised decks and porches should have guardrails on the edges. Have any leaves, snow, or ice cleared regularly. Use sand or salt on walking paths during winter. Clean up any spills in your garage right away. This includes oil or grease spills. What can I do in the bathroom? Use night lights. Install grab bars by the toilet and in the tub and shower. Do not use towel bars as grab bars. Use non-skid mats or decals in the tub or shower. If you need to sit down in the shower, use a plastic, non-slip stool. Keep the floor dry. Clean up any water that spills on the floor as soon as it happens. Remove soap buildup in the tub or shower  regularly. Attach bath mats securely with double-sided non-slip rug tape. Do not have throw rugs and other things on the floor that can make you trip. What can I do in the bedroom? Use night lights. Make sure that you have a light by your bed that is easy to reach. Do not use any sheets or blankets that are too big for your bed. They should not hang down onto the floor. Have a firm chair that has side arms. You can use this for support while you get dressed. Do not have throw rugs and other things on the floor that can make you trip. What can I do in the kitchen? Clean up any spills right away. Avoid walking on wet floors. Keep items that you use a lot in easy-to-reach places. If you need to reach something above you, use a strong step stool that has a grab bar. Keep electrical cords out of the way. Do not use floor polish or wax that makes floors slippery. If you must use wax, use non-skid floor wax. Do not have throw rugs and other things on  the floor that can make you trip. What can I do with my stairs? Do not leave any items on the stairs. Make sure that there are handrails on both sides of the stairs and use them. Fix handrails that are broken or loose. Make sure that handrails are as long as the stairways. Check any carpeting to make sure that it is firmly attached to the stairs. Fix any carpet that is loose or worn. Avoid having throw rugs at the top or bottom of the stairs. If you do have throw rugs, attach them to the floor with carpet tape. Make sure that you have a light switch at the top of the stairs and the bottom of the stairs. If you do not have them, ask someone to add them for you. What else can I do to help prevent falls? Wear shoes that: Do not have high heels. Have rubber bottoms. Are comfortable and fit you well. Are closed at the toe. Do not wear sandals. If you use a stepladder: Make sure that it is fully opened. Do not climb a closed stepladder. Make sure that  both sides of the stepladder are locked into place. Ask someone to hold it for you, if possible. Clearly mark and make sure that you can see: Any grab bars or handrails. First and last steps. Where the edge of each step is. Use tools that help you move around (mobility aids) if they are needed. These include: Canes. Walkers. Scooters. Crutches. Turn on the lights when you go into a dark area. Replace any light bulbs as soon as they burn out. Set up your furniture so you have a clear path. Avoid moving your furniture around. If any of your floors are uneven, fix them. If there are any pets around you, be aware of where they are. Review your medicines with your doctor. Some medicines can make you feel dizzy. This can increase your chance of falling. Ask your doctor what other things that you can do to help prevent falls. This information is not intended to replace advice given to you by your health care provider. Make sure you discuss any questions you have with your health care provider. Document Released: 03/24/2009 Document Revised: 11/03/2015 Document Reviewed: 07/02/2014 Elsevier Interactive Patient Education  2017 [employer].

## 2020-11-22 NOTE — Progress Notes (Signed)
Subjective:   Victoria Hanson is a 70 y.o. female who presents for Medicare Annual (Subsequent) preventive examination.  Review of Systems    No ROS.  Medicare Wellness Virtual Visit.  Visual/audio telehealth visit, UTA vital signs.   See social history for additional risk factors.   Cardiac Risk Factors include: advanced age (>81men, >81 women)     Objective:    Today's Vitals   11/22/20 0949  Weight: 200 lb (90.7 kg)  Height: 5\' 3"  (1.6 m)   Body mass index is 35.43 kg/m.  Advanced Directives 11/22/2020 09/19/2020 08/18/2020 07/09/2020 04/19/2020 11/20/2019 11/17/2019  Does Patient Have a Medical Advance Directive? Yes No No No No No No  Type of Paramedic of [place];Living will - - - - - -  Does patient want to make changes to medical advance directive? No - Patient declined - - - - - -  Copy of [place] in Chart? Yes - validated most recent copy scanned in chart (See row information) - - - - - -  Would patient like information on creating a medical advance directive? - No - Patient declined - No - Patient declined - No - Patient declined No - Patient declined    Current Medications (verified) Outpatient Encounter Medications as of 11/22/2020  Medication Sig   ALPRAZolam (XANAX) 0.5 MG tablet TAKE 1/2 TABLET BY MOUTH DAILY AS NEEDED   amLODipine (NORVASC) 2.5 MG tablet Take 1 tablet (2.5 mg total) by mouth daily.   aspirin EC 81 MG tablet Take 81 mg by mouth as needed.    calcium-vitamin D (OSCAL WITH D) 500-200 MG-UNIT tablet Take 1 tablet by mouth.   carbidopa-levodopa (SINEMET) 25-100 MG tablet Take 1.5 in am and one at 11:00 and one in afternoon and 1.5 at night.   citalopram (CELEXA) 10 MG tablet Take 1 tablet (10 mg total) by mouth daily.   fluticasone (FLONASE) 50 MCG/ACT nasal spray Place 2 sprays into both nostrils daily.   fluticasone-salmeterol (ADVAIR HFA) 115-21 MCG/ACT inhaler Inhale 2 puffs into the lungs 2 (two) times  daily.   furosemide (LASIX) 20 MG tablet Take 1 tablet (20 mg total) by mouth daily as needed.   montelukast (SINGULAIR) 10 MG tablet Take 1 tablet (10 mg total) by mouth at bedtime.   omeprazole (PRILOSEC) 20 MG capsule Take 1 capsule (20 mg total) by mouth daily.   PROAIR HFA 108 (90 Base) MCG/ACT inhaler INHALE 2 PUFFS BY MOUTH INTO THE LUNGS EVERY 6 HOURS AS NEEDED FOR WHEEZING   rosuvastatin (CRESTOR) 5 MG tablet Take 1 tablet (5 mg total) by mouth every Monday, Wednesday, and Friday.   Spacer/Aero-Holding Chambers (EASIVENT) inhaler See admin instructions.   traZODone (DESYREL) 50 MG tablet TAKE 2 TABLETS BY MOUTH AT BEDTIME   vitamin B-12 (CYANOCOBALAMIN) 100 MCG tablet Take 100 mcg by mouth daily.   No facility-administered encounter medications on file as of 11/22/2020.    Allergies (verified) Macrobid [nitrofurantoin macrocrystal]; Antihistamines, diphenhydramine-type; Aspirin; Ceftin [cefuroxime axetil]; Celecoxib; Cymbalta [duloxetine hcl]; Diphenhydramine; Escitalopram; Lexapro [escitalopram oxalate]; Metronidazole; Nitrofurantoin; Zoloft [sertraline hcl]; and Ciprofloxacin   History: Past Medical History:  Diagnosis Date   Anxiety    Asthma    Atypical chest pain    a. 2003 Cath: reportedly nl per pt [name]);  b. 04/2015 [facility] Admission, neg troponin.   Colon cancer ([place]) 1999   a. 1999: Pt had partial colectomy. Did develop lung metastasis. Had right upper lobectomy in 01/2007  then had associated chemotherapy.   COPD (chronic obstructive pulmonary disease) (HCC)    DDD (degenerative disc disease), lumbosacral    Depression    Echocardiogram abnormal    a. 02/2010 Echo: EF 55-60%, mild LAE, no regional wall motion abnormalities   Elevated transaminase level    a. ? NASH;  b. 06/2015 - nl LFTs.   GERD (gastroesophageal reflux disease)    Hematuria    Holter monitor, abnormal    a. 12/2009 Holter monitoring: NSR, rare PACs and PVCs.   Hypercholesterolemia     Hyperlipidemia    Hypertension    Leaky heart valve    a. Per pt report - no evidence of valvular abnormalities on prior echoes.   Lesion of skin of Right breast 10/20/2017   Lung metastases ([place]) 2008   a. S/P resection (lobectomy - right)   Morbid obesity ([place])    Nephrolithiasis    Parkinson disease ([place]) 08/2016   [name]   Past Surgical History:  Procedure Laterality Date   BREAST EXCISIONAL BIOPSY Right 10/07/2009   benign   CARDIAC CATHETERIZATION  2003   negative as per pt report   CHOLECYSTECTOMY  2000   COLON SURGERY     Colon cancer, removed part of large colon   COLONOSCOPY N/A 04/25/2015   Procedure: COLONOSCOPY;  Surgeon: Manya Silvas, MD;  Location: [facility];  Service: Endoscopy;  Laterality: N/A;   COLONOSCOPY WITH PROPOFOL N/A 06/14/2017   Procedure: COLONOSCOPY WITH PROPOFOL;  Surgeon: Manya Silvas, MD;  Location: [facility] ENDOSCOPY;  Service: Endoscopy;  Laterality: N/A;   COLONOSCOPY WITH PROPOFOL N/A 11/17/2019   Procedure: COLONOSCOPY WITH PROPOFOL;  Surgeon: [name], MD;  Location: [facility] ENDOSCOPY;  Service: Gastroenterology;  Laterality: N/A;   LOBECTOMY  01/2007   Right lung, upper lobe right side removed   TOTAL ABDOMINAL HYSTERECTOMY     abnormal bleeding   TUBAL LIGATION  1980   Family History  Problem Relation Age of Onset   Coronary artery disease Mother        died @ 54 of MI.   Mental illness Mother    Diabetes Mother    Heart attack Father 65       MI   Cancer Father        lung - died in his mid-70's   Hypertension Father    Diabetes Father    Sudden death Brother    Mental illness Brother    Heart attack Brother        MI @ 9, died @ age 56.   Breast cancer Neg Hx    Social History   Socioeconomic History   Marital status: Widowed    Spouse name: Not on file   Number of children: Not on file   Years of education: Not on file   Highest education level: Not on file  Occupational History   Not on file  Tobacco Use    Smoking status: Passive Smoke Exposure - Never Smoker   Smokeless tobacco: Never   Tobacco comments:    husband and son smoked in her home.  Vaping Use   Vaping Use: Never used  Substance and Sexual Activity   Alcohol use: Not Currently    Alcohol/week: 0.0 standard drinks   Drug use: No   Sexual activity: Never  Other Topics Concern   Not on file  Social History Narrative   Widowed.   Disabled (colon/lung CA), retired.   Lives in [place] with her  dtr, grand-dtr, and son.   Active.   Gets regular exercise, walks.   Social Determinants of Health   Financial Resource Strain: Low Risk    Difficulty of Paying Living Expenses: Not hard at all  Food Insecurity: No Food Insecurity   Worried About Charity fundraiser in the Last Year: Never true   [place] in the Last Year: Never true  Transportation Needs: No Transportation Needs   Lack of Transportation (Medical): No   Lack of Transportation (Non-Medical): No  Physical Activity: Not on file  Stress: No Stress Concern Present   Feeling of Stress : Not at all  Social Connections: Unknown   Frequency of Communication with Friends and Family: More than three times a week   Frequency of Social Gatherings with Friends and Family: More than three times a week   Attends Religious Services: Not on Electrical engineer or Organizations: Not on file   Attends Archivist Meetings: Not on file   Marital Status: Not on file    Tobacco Counseling Counseling given: Not Answered Tobacco comments: husband and son smoked in her home.   Clinical Intake:  Pre-visit preparation completed: Yes        Diabetes: No  How often do you need to have someone help you when you read instructions, pamphlets, or other written materials from your doctor or pharmacy?: 1 - Never   Interpreter Needed?: No      Activities of Daily Living In your present state of health, do you have any difficulty performing  the following activities: 11/22/2020  Hearing? N  Vision? N  Difficulty concentrating or making decisions? N  Walking or climbing stairs? N  Dressing or bathing? N  Doing errands, shopping? N  Preparing Food and eating ? N  Using the Toilet? N  In the past six months, have you accidently leaked urine? N  Do you have problems with loss of bowel control? N  Managing your Medications? N  Managing your Finances? N  Housekeeping or managing your Housekeeping? N  Some recent data might be hidden    Patient Care Team: Einar Pheasant, MD as PCP - General (Unknown Physician Specialty) Jettie Booze, MD as PCP - Cardiology (Cardiology) Manya Silvas, MD (Inactive) (Gastroenterology)  Indicate any recent Medical Services you may have received from other than [facility] providers in the past year (date may be approximate).     Assessment:   This is a routine wellness examination for Victoria Hanson.  I connected with Victoria Hanson today by telephone and verified that I am speaking with the correct person using two identifiers. Location patient: home Location provider: work Persons participating in the virtual visit: patient, Marine scientist.    I discussed the limitations, risks, security and privacy concerns of performing an evaluation and management service by telephone and the availability of in person appointments. The patient expressed understanding and verbally consented to this telephonic visit.    Interactive audio and video telecommunications were attempted between this provider and patient, however failed, due to patient having technical difficulties OR patient did not have access to video capability.  We continued and completed visit with audio only.  Some vital signs may be absent or patient reported.   Hearing/Vision screen Hearing Screening - Comments:: Patient is able to hear conversational tones without difficulty.  No issues reported.  Vision Screening - Comments:: Wears corrective lenses   Annual visits  Visual acuity not assessed per patient preference  since they have regular follow up with the ophthalmologist   Dietary issues and exercise activities discussed: Current Exercise Habits: Home exercise routine, Intensity: Mild Regular diet Good water intake   Goals Addressed               This Visit's Progress     Patient Stated     Healthy diet (pt-stated)        Low salt        Depression Screen PHQ 2/9 Scores 11/22/2020 11/04/2020 07/22/2020 04/21/2020 02/24/2020 11/20/2019 09/29/2019  PHQ - 2 Score 0 2 0 0 0 2 0  PHQ- 9 Score 0 4 0 0 - 2 0    Fall Risk Fall Risk  11/22/2020 11/04/2020 02/24/2020 01/08/2020 11/24/2019  Falls in the past year? 0 0 0 0 0  Number falls in past yr: 0 - 0 0 -  Injury with Fall? 0 - 0 0 -  Follow up Falls evaluation completed Falls evaluation completed Falls evaluation completed Falls evaluation completed Falls evaluation completed    [place]: Handrails in use when climbing stairs? Yes Home free of loose throw rugs in walkways, pet beds, electrical cords, etc? Yes  Adequate lighting in your home to reduce risk of falls? Yes   ASSISTIVE DEVICES UTILIZED TO PREVENT FALLS: Life alert? No  Use of a cane, walker or w/c? No   TIMED UP AND GO: Was the test performed? No .   Cognitive Function:  Patient is alert and oriented x3.    6CIT Screen 11/22/2020 11/20/2019 11/19/2018 05/29/2017  What Year? 0 points 0 points 0 points 0 points  What month? 0 points 0 points 0 points 0 points  What time? 0 points 0 points 0 points 0 points  Count back from 20 0 points 0 points 0 points 0 points  Months in reverse 0 points 0 points 0 points 0 points  Repeat phrase - 2 points 0 points 0 points  Total Score - 2 0 0    Immunizations Immunization History  Administered Date(s) Administered   Fluad Quad(high Dose 65+) 02/26/2019, 02/24/2020   Influenza, High Dose Seasonal PF 04/17/2017, 04/16/2018    Influenza,inj,Quad PF,6+ Mos 04/02/2013, 04/07/2014, 03/15/2015, 02/03/2016   Moderna Sars-Covid-2 Vaccination 12/03/2019, 01/02/2020   Pneumococcal Conjugate-13 01/11/2015   Pneumococcal Polysaccharide-23 01/14/2017   Tdap 01/08/2020    Health Maintenance Health Maintenance  Topic Date Due   MAMMOGRAM  02/07/2019   COVID-19 Vaccine (3 - Moderna risk series) 12/08/2020 (Originally 01/30/2020)   Zoster Vaccines- Shingrix (1 of 2) 02/22/2021 (Originally 11/17/1969)   INFLUENZA VACCINE  01/09/2021   COLONOSCOPY (Pts 45-37yrs Insurance coverage will need to be confirmed)  11/16/2024   TETANUS/TDAP  01/07/2030   DEXA SCAN  Completed   Hepatitis C Screening  Completed   PNA vac Low Risk Adult  Completed   HPV VACCINES  Aged Out   Mammogram - plans to schedule Dexa Scan- plans to schedule  Shingles vaccine- considering  Lung Cancer Screening: (Low Dose CT Chest recommended if Age 55-80 years, 30 pack-year currently smoking OR have quit w/in 15years.) does not qualify.   Vision Screening: Recommended annual ophthalmology exams for early detection of glaucoma and other disorders of the eye. Is the patient up to date with their annual eye exam?  Yes   Dental Screening: Recommended annual dental exams for proper oral hygiene. Dentures.  Community Resource Referral / Chronic Care Management: CRR required this visit?  No  CCM required this visit?  No      Plan:   Keep all routine maintenance appointments.   I have personally reviewed and noted the following in the patient's chart:   Medical and social history Use of alcohol, tobacco or illicit drugs  Current medications and supplements including opioid prescriptions.  Functional ability and status Nutritional status Physical activity Advanced directives List of other physicians Hospitalizations, surgeries, and ER visits in previous 12 months Vitals Screenings to include cognitive, depression, and falls Referrals and  appointments  In addition, I have reviewed and discussed with patient certain preventive protocols, quality metrics, and best practice recommendations. A written personalized care plan for preventive services as well as general preventive health recommendations were provided to patient.     Varney Biles, LPN   [id number]/83/[id number]

## 2020-11-22 NOTE — Progress Notes (Signed)
Patient ID: Victoria Hanson, female   DOB: 17-Jul-1950, 70 y.o.   MRN: [id number]   Virtual Visit via telephone Note  This visit type was conducted due to national recommendations for restrictions regarding the COVID-19 pandemic (e.g. social distancing).  This format is felt to be most appropriate for this patient at this time.  All issues noted in this document were discussed and addressed.  No physical exam was performed (except for noted visual exam findings with Video Visits).   I connected with [name] by telephone and verified that I am speaking with the correct person using two identifiers. Location patient: home Location provider: work Persons participating in thetelephone visit: patient, provider  The limitations, risks, security and privacy concerns of performing an evaluation and management service by telephone and the availability of in person appointments have been discussed. It has also been discussed with the patient that there may be a patient responsible charge related to this service. The patient expressed understanding and agreed to proceed.   Reason for visit: work in appt.    HPI: Work in with concerns regarding a possible urinary tract infection.  Has had her teeth pulled.  Symptoms started several days ago.  Noticed dysuria, hematuria and increased frequency.  No fever.  Some minimal nausea.  No vomiting.  No chest pain.  Breathing stable.  No abdominal pain or back pain.  No vaginal symptoms.  Blood pressure on recheck today 138/78.    ROS: See pertinent positives and negatives per HPI.  Past Medical History:  Diagnosis Date   Anxiety    Asthma    Atypical chest pain    a. 2003 Cath: reportedly nl per pt [name]);  b. 04/2015 [facility] Admission, neg troponin.   Colon cancer ([place]) 1999   a. 1999: Pt had partial colectomy. Did develop lung metastasis. Had right upper lobectomy in 01/2007 then had associated chemotherapy.   COPD (chronic obstructive pulmonary  disease) (HCC)    DDD (degenerative disc disease), lumbosacral    Depression    Echocardiogram abnormal    a. 02/2010 Echo: EF 55-60%, mild LAE, no regional wall motion abnormalities   Elevated transaminase level    a. ? NASH;  b. 06/2015 - nl LFTs.   GERD (gastroesophageal reflux disease)    Hematuria    Holter monitor, abnormal    a. 12/2009 Holter monitoring: NSR, rare PACs and PVCs.   Hypercholesterolemia    Hyperlipidemia    Hypertension    Leaky heart valve    a. Per pt report - no evidence of valvular abnormalities on prior echoes.   Lesion of skin of Right breast 10/20/2017   Lung metastases ([place]) 2008   a. S/P resection (lobectomy - right)   Morbid obesity ([place])    Nephrolithiasis    Parkinson disease ([place]) 08/2016   [name]    Past Surgical History:  Procedure Laterality Date   BREAST EXCISIONAL BIOPSY Right 10/07/2009   benign   CARDIAC CATHETERIZATION  2003   negative as per pt report   CHOLECYSTECTOMY  2000   COLON SURGERY     Colon cancer, removed part of large colon   COLONOSCOPY N/A 04/25/2015   Procedure: COLONOSCOPY;  Surgeon: Manya Silvas, MD;  Location: [facility];  Service: Endoscopy;  Laterality: N/A;   COLONOSCOPY WITH PROPOFOL N/A 06/14/2017   Procedure: COLONOSCOPY WITH PROPOFOL;  Surgeon: Manya Silvas, MD;  Location: [facility] ENDOSCOPY;  Service: Endoscopy;  Laterality: N/A;   COLONOSCOPY WITH PROPOFOL N/A  11/17/2019   Procedure: COLONOSCOPY WITH PROPOFOL;  Surgeon: [name], MD;  Location: [facility] ENDOSCOPY;  Service: Gastroenterology;  Laterality: N/A;   LOBECTOMY  01/2007   Right lung, upper lobe right side removed   TOTAL ABDOMINAL HYSTERECTOMY     abnormal bleeding   TUBAL LIGATION  1980    Family History  Problem Relation Age of Onset   Coronary artery disease Mother        died @ 79 of MI.   Mental illness Mother    Diabetes Mother    Heart attack Father 64       MI   Cancer Father        lung - died in his mid-70's    Hypertension Father    Diabetes Father    Sudden death Brother    Mental illness Brother    Heart attack Brother        MI @ 52, died @ age 43.   Breast cancer Neg Hx     SOCIAL HX: reviewed.    Current Outpatient Medications:    ALPRAZolam (XANAX) 0.5 MG tablet, TAKE 1/2 TABLET BY MOUTH DAILY AS NEEDED, Disp: 30 tablet, Rfl: 0   amLODipine (NORVASC) 2.5 MG tablet, Take 1 tablet (2.5 mg total) by mouth daily., Disp: 90 tablet, Rfl: 1   aspirin EC 81 MG tablet, Take 81 mg by mouth as needed. , Disp: , Rfl:    calcium-vitamin D (OSCAL WITH D) 500-200 MG-UNIT tablet, Take 1 tablet by mouth., Disp: , Rfl:    carbidopa-levodopa (SINEMET) 25-100 MG tablet, Take 1.5 in am and one at 11:00 and one in afternoon and 1.5 at night., Disp: 150 tablet, Rfl: 0   citalopram (CELEXA) 10 MG tablet, Take 1 tablet (10 mg total) by mouth daily., Disp: 30 tablet, Rfl: 1   fluticasone (FLONASE) 50 MCG/ACT nasal spray, Place 2 sprays into both nostrils daily., Disp: 11.1 mL, Rfl: 0   fluticasone-salmeterol (ADVAIR HFA) 115-21 MCG/ACT inhaler, Inhale 2 puffs into the lungs 2 (two) times daily., Disp: 1 Inhaler, Rfl: 4   furosemide (LASIX) 20 MG tablet, Take 1 tablet (20 mg total) by mouth daily as needed., Disp: 30 tablet, Rfl: 0   montelukast (SINGULAIR) 10 MG tablet, Take 1 tablet (10 mg total) by mouth at bedtime., Disp: 30 tablet, Rfl: 2   omeprazole (PRILOSEC) 20 MG capsule, Take 1 capsule (20 mg total) by mouth daily., Disp: 90 capsule, Rfl: 1   PROAIR HFA 108 (90 Base) MCG/ACT inhaler, INHALE 2 PUFFS BY MOUTH INTO THE LUNGS EVERY 6 HOURS AS NEEDED FOR WHEEZING, Disp: 8.5 g, Rfl: 2   rosuvastatin (CRESTOR) 5 MG tablet, Take 1 tablet (5 mg total) by mouth every Monday, Wednesday, and Friday., Disp: 90 tablet, Rfl: 1   Spacer/Aero-Holding Chambers (EASIVENT) inhaler, See admin instructions., Disp: , Rfl:    sulfamethoxazole-trimethoprim (BACTRIM DS) 800-160 MG tablet, Take 1 tablet by mouth 2 (two) times  daily., Disp: 10 tablet, Rfl: 0   traZODone (DESYREL) 50 MG tablet, TAKE 2 TABLETS BY MOUTH AT BEDTIME, Disp: 180 tablet, Rfl: 1   vitamin B-12 (CYANOCOBALAMIN) 100 MCG tablet, Take 100 mcg by mouth daily., Disp: , Rfl:   EXAM:  VITALS per patient if applicable:  GENERAL: alert, oriented, appears well and in no acute distress  HEENT: atraumatic, conjunttiva clear, no obvious abnormalities on inspection of external nose and ears  NECK: normal movements of the head and neck  LUNGS: on inspection no signs of respiratory  distress, breathing rate appears normal, no obvious gross SOB, gasping or wheezing  CV: no obvious cyanosis  PSYCH/NEURO: pleasant and cooperative, no obvious depression or anxiety, speech and thought processing grossly intact  ASSESSMENT AND PLAN:  Discussed the following assessment and plan:  Problem List Items Addressed This Visit     Anemia    Follow cbc.         Aortic atherosclerosis (HCC)    Continue crestor.        Carotid artery disease ([place])    Evaluated by [facility].  Continue risk factor modification.         Chronic obstructive pulmonary disease ([place])    Continue singulair and advair.  Breathing stable.  Follow.        CKD (chronic kidney disease), stage III (HCC)    Avoid antiinflammatories.  Stay hydrated.  Follow metabolic panel.        Depression, recurrent ([place])    Continue citalopram.  Followed by psychiatry.        Fibromuscular dysplasia ([place])    Has been evaluated by [facility].  Continue risk factor modificaiton.        GERD (gastroesophageal reflux disease)    Controlled.  On prilosec.        Hyperglycemia    Low carb diet and exercise.  Follow met b and a1c.        Hypertension    Blood pressure doing well.  Continue amlodipine.  Follow pressures.  Follow metabolic panel.        Parkinson's disease ([place])    Continue sinemet.  Followed by neurology.        Pure hypercholesterolemia    Continue crestor.  Low  cholesterol diet and exercise.  Follow lipid panel and liver function tests.         UTI (urinary tract infection)    Symptoms and urine appear to be c/w UTI.  Treat with bactrim.  Await culture results.  Stay hydrated.  Follow.        Relevant Medications   sulfamethoxazole-trimethoprim (BACTRIM DS) 800-160 MG tablet    Return if symptoms worsen or fail to improve.   I discussed the assessment and treatment plan with the patient. The patient was provided an opportunity to ask questions and all were answered. The patient agreed with the plan and demonstrated an understanding of the instructions.   The patient was advised to call back or seek an in-person evaluation if the symptoms worsen or if the condition fails to improve as anticipated.  I provided 23 minutes of non-face-to-face time during this encounter.   Einar Pheasant, MD

## 2020-11-23 ENCOUNTER — Other Ambulatory Visit: Payer: Self-pay | Admitting: Internal Medicine

## 2020-11-23 DIAGNOSIS — R319 Hematuria, unspecified: Secondary | ICD-10-CM

## 2020-11-23 LAB — URINE CULTURE
MICRO NUMBER:: 12000119
SPECIMEN QUALITY:: ADEQUATE

## 2020-11-23 NOTE — Progress Notes (Signed)
Order placed for f/u urine

## 2020-11-24 ENCOUNTER — Telehealth: Payer: Self-pay

## 2020-11-24 NOTE — Telephone Encounter (Signed)
Left a message to call back for lab results.

## 2020-11-24 NOTE — Telephone Encounter (Signed)
Pt returned your call.  

## 2020-11-25 NOTE — Telephone Encounter (Signed)
Pt has already spoken with Azzel & results reviewed.

## 2020-11-27 ENCOUNTER — Encounter: Payer: Self-pay | Admitting: Internal Medicine

## 2020-11-27 DIAGNOSIS — N390 Urinary tract infection, site not specified: Secondary | ICD-10-CM | POA: Insufficient documentation

## 2020-11-27 NOTE — Assessment & Plan Note (Signed)
Continue crestor.  Low cholesterol diet and exercise. Follow lipid panel and liver function tests.   

## 2020-11-27 NOTE — Assessment & Plan Note (Signed)
Controlled.  On prilosec.

## 2020-11-27 NOTE — Assessment & Plan Note (Signed)
Continue citalopram.  Followed by psychiatry.

## 2020-11-27 NOTE — Assessment & Plan Note (Signed)
Avoid antiinflammatories.  Stay hydrated.  Follow metabolic panel.

## 2020-11-27 NOTE — Assessment & Plan Note (Addendum)
Continue sinemet.  Followed by neurology.

## 2020-11-27 NOTE — Assessment & Plan Note (Signed)
Continue crestor 

## 2020-11-27 NOTE — Assessment & Plan Note (Signed)
Symptoms and urine appear to be c/w UTI.  Treat with bactrim.  Await culture results.  Stay hydrated.  Follow.

## 2020-11-27 NOTE — Assessment & Plan Note (Signed)
Follow cbc.  

## 2020-11-27 NOTE — Assessment & Plan Note (Signed)
Continue singulair and advair.  Breathing stable.  Follow.

## 2020-11-27 NOTE — Assessment & Plan Note (Signed)
Low carb diet and exercise.  Follow met b and a1c.  

## 2020-11-27 NOTE — Assessment & Plan Note (Signed)
Has been evaluated by [facility].  Continue risk factor modificaiton.

## 2020-11-27 NOTE — Assessment & Plan Note (Signed)
Evaluated by [facility].  Continue risk factor modification.

## 2020-11-27 NOTE — Assessment & Plan Note (Signed)
Blood pressure doing well.  Continue amlodipine.  Follow pressures.  Follow metabolic panel.

## 2020-11-29 ENCOUNTER — Ambulatory Visit: Payer: Medicare Other | Admitting: Interventional Cardiology

## 2020-11-30 DIAGNOSIS — H2513 Age-related nuclear cataract, bilateral: Secondary | ICD-10-CM | POA: Diagnosis not present

## 2020-12-02 ENCOUNTER — Other Ambulatory Visit: Payer: Medicare Other

## 2020-12-07 DIAGNOSIS — G20 Parkinson's disease: Secondary | ICD-10-CM | POA: Diagnosis not present

## 2020-12-07 DIAGNOSIS — G4752 REM sleep behavior disorder: Secondary | ICD-10-CM | POA: Diagnosis not present

## 2020-12-07 DIAGNOSIS — R413 Other amnesia: Secondary | ICD-10-CM | POA: Diagnosis not present

## 2020-12-09 ENCOUNTER — Other Ambulatory Visit: Payer: Medicare Other

## 2020-12-13 ENCOUNTER — Other Ambulatory Visit: Payer: Self-pay | Admitting: Internal Medicine

## 2020-12-14 DIAGNOSIS — D509 Iron deficiency anemia, unspecified: Secondary | ICD-10-CM | POA: Diagnosis not present

## 2020-12-14 DIAGNOSIS — K219 Gastro-esophageal reflux disease without esophagitis: Secondary | ICD-10-CM | POA: Diagnosis not present

## 2020-12-14 DIAGNOSIS — Z85038 Personal history of other malignant neoplasm of large intestine: Secondary | ICD-10-CM | POA: Diagnosis not present

## 2020-12-16 ENCOUNTER — Other Ambulatory Visit (INDEPENDENT_AMBULATORY_CARE_PROVIDER_SITE_OTHER): Payer: Medicare Other

## 2020-12-16 ENCOUNTER — Other Ambulatory Visit: Payer: Self-pay

## 2020-12-16 ENCOUNTER — Ambulatory Visit: Payer: Medicare Other | Admitting: Internal Medicine

## 2020-12-16 DIAGNOSIS — R319 Hematuria, unspecified: Secondary | ICD-10-CM

## 2020-12-16 DIAGNOSIS — E7800 Pure hypercholesterolemia, unspecified: Secondary | ICD-10-CM | POA: Diagnosis not present

## 2020-12-16 DIAGNOSIS — I10 Essential (primary) hypertension: Secondary | ICD-10-CM

## 2020-12-16 DIAGNOSIS — D649 Anemia, unspecified: Secondary | ICD-10-CM | POA: Diagnosis not present

## 2020-12-16 LAB — LIPID PANEL
Cholesterol: 158 mg/dL (ref 0–200)
HDL: 59 mg/dL (ref >39.00)
LDL Cholesterol: 79 mg/dL (ref 0–99)
NonHDL: 99.13
Total CHOL/HDL Ratio: 3
Triglycerides: 102 mg/dL (ref 0.0–149.0)
VLDL: 20.4 mg/dL (ref 0.0–40.0)

## 2020-12-16 LAB — CBC WITH DIFFERENTIAL/PLATELET
Basophils Absolute: 0 10*3/uL (ref 0.0–0.1)
Basophils Relative: 0.7 % (ref 0.0–3.0)
Eosinophils Absolute: 0.1 10*3/uL (ref 0.0–0.7)
Eosinophils Relative: 2.1 % (ref 0.0–5.0)
HCT: 34.6 % — ABNORMAL LOW (ref 36.0–46.0)
Hemoglobin: 11.3 g/dL — ABNORMAL LOW (ref 12.0–15.0)
Lymphocytes Relative: 27 % (ref 12.0–46.0)
Lymphs Abs: 1.7 10*3/uL (ref 0.7–4.0)
MCHC: 32.8 g/dL (ref 30.0–36.0)
MCV: 86 fl (ref 78.0–100.0)
Monocytes Absolute: 0.4 10*3/uL (ref 0.1–1.0)
Monocytes Relative: 6.8 % (ref 3.0–12.0)
Neutro Abs: 4 10*3/uL (ref 1.4–7.7)
Neutrophils Relative %: 63.4 % (ref 43.0–77.0)
Platelets: 234 10*3/uL (ref 150.0–400.0)
RBC: 4.03 Mil/uL (ref 3.87–5.11)
RDW: 14 % (ref 11.5–15.5)
WBC: 6.2 10*3/uL (ref 4.0–10.5)

## 2020-12-16 LAB — BASIC METABOLIC PANEL
BUN: 17 mg/dL (ref 6–23)
CO2: 27 mEq/L (ref 19–32)
Calcium: 9.1 mg/dL (ref 8.4–10.5)
Chloride: 105 mEq/L (ref 96–112)
Creatinine, Ser: 0.98 mg/dL (ref 0.40–1.20)
GFR: 58.25 mL/min — ABNORMAL LOW (ref >60.00)
Glucose, Bld: 90 mg/dL (ref 70–99)
Potassium: 4.2 mEq/L (ref 3.5–5.1)
Sodium: 141 mEq/L (ref 135–145)

## 2020-12-16 LAB — VITAMIN B12: Vitamin B-12: >1550 pg/mL — ABNORMAL HIGH (ref 211–911)

## 2020-12-16 LAB — FERRITIN: Ferritin: 26.5 ng/mL (ref 10.0–291.0)

## 2020-12-16 LAB — TSH: TSH: 1.56 u[IU]/mL (ref 0.35–5.50)

## 2020-12-16 NOTE — Addendum Note (Signed)
Addended by: Neta Ehlers on: 12/16/2020 01:39 PM   Modules accepted: Orders

## 2020-12-18 LAB — URINE CULTURE
MICRO NUMBER:: 12097169
SPECIMEN QUALITY:: ADEQUATE

## 2020-12-21 ENCOUNTER — Other Ambulatory Visit: Payer: Self-pay

## 2020-12-21 ENCOUNTER — Ambulatory Visit (INDEPENDENT_AMBULATORY_CARE_PROVIDER_SITE_OTHER): Payer: Medicare Other | Admitting: Internal Medicine

## 2020-12-21 ENCOUNTER — Ambulatory Visit: Payer: Medicare Other | Admitting: Internal Medicine

## 2020-12-21 ENCOUNTER — Encounter: Payer: Self-pay | Admitting: Internal Medicine

## 2020-12-21 DIAGNOSIS — F419 Anxiety disorder, unspecified: Secondary | ICD-10-CM

## 2020-12-21 DIAGNOSIS — J449 Chronic obstructive pulmonary disease, unspecified: Secondary | ICD-10-CM

## 2020-12-21 DIAGNOSIS — D649 Anemia, unspecified: Secondary | ICD-10-CM

## 2020-12-21 DIAGNOSIS — G20 Parkinson's disease: Secondary | ICD-10-CM | POA: Diagnosis not present

## 2020-12-21 DIAGNOSIS — I773 Arterial fibromuscular dysplasia: Secondary | ICD-10-CM

## 2020-12-21 DIAGNOSIS — N183 Chronic kidney disease, stage 3 unspecified: Secondary | ICD-10-CM

## 2020-12-21 DIAGNOSIS — R739 Hyperglycemia, unspecified: Secondary | ICD-10-CM | POA: Diagnosis not present

## 2020-12-21 DIAGNOSIS — M25561 Pain in right knee: Secondary | ICD-10-CM

## 2020-12-21 DIAGNOSIS — E7800 Pure hypercholesterolemia, unspecified: Secondary | ICD-10-CM | POA: Diagnosis not present

## 2020-12-21 DIAGNOSIS — I779 Disorder of arteries and arterioles, unspecified: Secondary | ICD-10-CM

## 2020-12-21 DIAGNOSIS — K219 Gastro-esophageal reflux disease without esophagitis: Secondary | ICD-10-CM

## 2020-12-21 DIAGNOSIS — I10 Essential (primary) hypertension: Secondary | ICD-10-CM | POA: Diagnosis not present

## 2020-12-21 DIAGNOSIS — F339 Major depressive disorder, recurrent, unspecified: Secondary | ICD-10-CM

## 2020-12-21 DIAGNOSIS — I700 Atherosclerosis of aorta: Secondary | ICD-10-CM | POA: Diagnosis not present

## 2020-12-21 DIAGNOSIS — G20A1 Parkinson's disease without dyskinesia, without mention of fluctuations: Secondary | ICD-10-CM

## 2020-12-21 NOTE — Progress Notes (Signed)
Patient ID: Victoria Hanson, female   DOB: [birth year], 70 y.o.   MRN: [id number]   Subjective:    Patient ID: Victoria Hanson, female    DOB: 1950/08/07, 70 y.o.   MRN: [id number]  HPI This visit occurred during the SARS-CoV-2 public health emergency.  Safety protocols were in place, including screening questions prior to the visit, additional usage of staff PPE, and extensive cleaning of exam room while observing appropriate contact time as indicated for disinfecting solutions.   Patient here for a scheduled follow up.  Recently evaluated for anemia.  Saw GI.  Recommended f/u colonoscopy and EGD.  No acid reflux reported.  No abdominal pain.  No chest pain or sob reported.  Has parkinsons.  On sinemet.  Still with persistent issues with her right knee.  Planning to f/u with ortho next week.  Taking tylenol. Increased stress.  Overall appears to be handling things relatively well.  Does not feel needs any further intervention.    Past Medical History:  Diagnosis Date   Anxiety    Asthma    Atypical chest pain    a. 2003 Cath: reportedly nl per pt [name]);  b. 04/2015 [facility] Admission, neg troponin.   Colon cancer ([place]) 1999   a. 1999: Pt had partial colectomy. Did develop lung metastasis. Had right upper lobectomy in 01/2007 then had associated chemotherapy.   COPD (chronic obstructive pulmonary disease) (HCC)    DDD (degenerative disc disease), lumbosacral    Depression    Echocardiogram abnormal    a. 02/2010 Echo: EF 55-60%, mild LAE, no regional wall motion abnormalities   Elevated transaminase level    a. ? NASH;  b. 06/2015 - nl LFTs.   GERD (gastroesophageal reflux disease)    Hematuria    Holter monitor, abnormal    a. 12/2009 Holter monitoring: NSR, rare PACs and PVCs.   Hypercholesterolemia    Hyperlipidemia    Hypertension    Leaky heart valve    a. Per pt report - no evidence of valvular abnormalities on prior echoes.   Lesion of skin of Right breast 10/20/2017   Lung  metastases ([place]) 2008   a. S/P resection (lobectomy - right)   Morbid obesity ([place])    Nephrolithiasis    Parkinson disease ([place]) 08/2016   [name]   Past Surgical History:  Procedure Laterality Date   BREAST EXCISIONAL BIOPSY Right 10/07/2009   benign   CARDIAC CATHETERIZATION  2003   negative as per pt report   CHOLECYSTECTOMY  2000   COLON SURGERY     Colon cancer, removed part of large colon   COLONOSCOPY N/A 04/25/2015   Procedure: COLONOSCOPY;  Surgeon: Manya Silvas, MD;  Location: [facility];  Service: Endoscopy;  Laterality: N/A;   COLONOSCOPY WITH PROPOFOL N/A 06/14/2017   Procedure: COLONOSCOPY WITH PROPOFOL;  Surgeon: Manya Silvas, MD;  Location: [facility] ENDOSCOPY;  Service: Endoscopy;  Laterality: N/A;   COLONOSCOPY WITH PROPOFOL N/A 11/17/2019   Procedure: COLONOSCOPY WITH PROPOFOL;  Surgeon: [name], MD;  Location: [facility] ENDOSCOPY;  Service: Gastroenterology;  Laterality: N/A;   LOBECTOMY  01/2007   Right lung, upper lobe right side removed   TOTAL ABDOMINAL HYSTERECTOMY     abnormal bleeding   TUBAL LIGATION  1980   Family History  Problem Relation Age of Onset   Coronary artery disease Mother        died @ 89 of MI.   Mental illness Mother    Diabetes Mother  Heart attack Father 48       MI   Cancer Father        lung - died in his mid-70's   Hypertension Father    Diabetes Father    Sudden death Brother    Mental illness Brother    Heart attack Brother        MI @ 59, died @ age 1.   Breast cancer Neg Hx    Social History   Socioeconomic History   Marital status: Widowed    Spouse name: Not on file   Number of children: Not on file   Years of education: Not on file   Highest education level: Not on file  Occupational History   Not on file  Tobacco Use   Smoking status: Never    Passive exposure: Yes   Smokeless tobacco: Never   Tobacco comments:    husband and son smoked in her home.  Vaping Use   Vaping Use: Never used   Substance and Sexual Activity   Alcohol use: Not Currently    Alcohol/week: 0.0 standard drinks   Drug use: No   Sexual activity: Never  Other Topics Concern   Not on file  Social History Narrative   Widowed.   Disabled (colon/lung CA), retired.   Lives in [place] with her dtr, grand-dtr, and son.   Active.   Gets regular exercise, walks.   Social Determinants of Health   Financial Resource Strain: Low Risk    Difficulty of Paying Living Expenses: Not hard at all  Food Insecurity: No Food Insecurity   Worried About Charity fundraiser in the Last Year: Never true   [place] in the Last Year: Never true  Transportation Needs: No Transportation Needs   Lack of Transportation (Medical): No   Lack of Transportation (Non-Medical): No  Physical Activity: Not on file  Stress: No Stress Concern Present   Feeling of Stress : Not at all  Social Connections: Unknown   Frequency of Communication with Friends and Family: More than three times a week   Frequency of Social Gatherings with Friends and Family: More than three times a week   Attends Religious Services: Not on file   Active Member of Clubs or Organizations: Not on file   Attends Archivist Meetings: Not on file   Marital Status: Not on file    Review of Systems  Constitutional:  Negative for appetite change and unexpected weight change.  HENT:  Negative for congestion and sinus pressure.   Respiratory:  Negative for cough, chest tightness and shortness of breath.   Cardiovascular:  Negative for chest pain, palpitations and leg swelling.  Gastrointestinal:  Negative for abdominal pain, diarrhea, nausea and vomiting.  Genitourinary:  Negative for difficulty urinating and dysuria.  Musculoskeletal:  Negative for myalgias.       Knee pain as outlined.   Skin:  Negative for color change and rash.  Neurological:  Negative for dizziness, light-headedness and headaches.  Psychiatric/Behavioral:  Negative  for agitation and dysphoric mood.       Objective:    Physical Exam Vitals reviewed.  Constitutional:      General: She is not in acute distress.    Appearance: Normal appearance.  HENT:     Head: Normocephalic and atraumatic.     Right Ear: External ear normal.     Left Ear: External ear normal.  Eyes:     General: No scleral icterus.  Right eye: No discharge.        Left eye: No discharge.     Conjunctiva/sclera: Conjunctivae normal.  Neck:     Thyroid: No thyromegaly.  Cardiovascular:     Rate and Rhythm: Normal rate and regular rhythm.  Pulmonary:     Effort: No respiratory distress.     Breath sounds: Normal breath sounds. No wheezing.  Abdominal:     General: Bowel sounds are normal.     Palpations: Abdomen is soft.     Tenderness: There is no abdominal tenderness.  Musculoskeletal:        General: No swelling or tenderness.     Cervical back: Neck supple. No tenderness.  Lymphadenopathy:     Cervical: No cervical adenopathy.  Skin:    Findings: No erythema or rash.  Neurological:     Mental Status: She is alert.  Psychiatric:        Mood and Affect: Mood normal.        Behavior: Behavior normal.    BP 118/80   Pulse 90   Temp 97.6 F (36.4 C) (Oral)   Ht 5' 3" (1.6 m)   Wt 199 lb 12.8 oz (90.6 kg)   SpO2 97%   BMI 35.39 kg/m  Wt Readings from Last 3 Encounters:  12/21/20 199 lb 12.8 oz (90.6 kg)  11/22/20 201 lb (91.2 kg)  11/22/20 200 lb (90.7 kg)    Outpatient Encounter Medications as of 12/21/2020  Medication Sig   ALPRAZolam (XANAX) 0.5 MG tablet TAKE 1/2 TABLET BY MOUTH DAILY AS NEEDED   amLODipine (NORVASC) 2.5 MG tablet Take 1 tablet (2.5 mg total) by mouth daily.   aspirin EC 81 MG tablet Take 81 mg by mouth as needed.    calcium-vitamin D (OSCAL WITH D) 500-200 MG-UNIT tablet Take 1 tablet by mouth.   carbidopa-levodopa (SINEMET) 25-100 MG tablet Take 1.5 in am and one at 11:00 and one in afternoon and 1.5 at night.   citalopram  (CELEXA) 10 MG tablet TAKE 1 TABLET BY MOUTH DAILY   fluticasone-salmeterol (ADVAIR HFA) 115-21 MCG/ACT inhaler Inhale 2 puffs into the lungs 2 (two) times daily.   furosemide (LASIX) 20 MG tablet Take 1 tablet (20 mg total) by mouth daily as needed.   montelukast (SINGULAIR) 10 MG tablet Take 1 tablet (10 mg total) by mouth at bedtime.   omeprazole (PRILOSEC) 20 MG capsule Take 1 capsule (20 mg total) by mouth daily.   PROAIR HFA 108 (90 Base) MCG/ACT inhaler INHALE 2 PUFFS BY MOUTH INTO THE LUNGS EVERY 6 HOURS AS NEEDED FOR WHEEZING   rosuvastatin (CRESTOR) 5 MG tablet Take 1 tablet (5 mg total) by mouth every Monday, Wednesday, and Friday.   Spacer/Aero-Holding Chambers (EASIVENT) inhaler See admin instructions.   sulfamethoxazole-trimethoprim (BACTRIM DS) 800-160 MG tablet Take 1 tablet by mouth 2 (two) times daily.   traZODone (DESYREL) 50 MG tablet TAKE 2 TABLETS BY MOUTH AT BEDTIME   vitamin B-12 (CYANOCOBALAMIN) 100 MCG tablet Take 100 mcg by mouth daily.   fluticasone (FLONASE) 50 MCG/ACT nasal spray Place 2 sprays into both nostrils daily.   No facility-administered encounter medications on file as of 12/21/2020.     Lab Results  Component Value Date   WBC 6.2 12/16/2020   HGB 11.3 (L) 12/16/2020   HCT 34.6 (L) 12/16/2020   PLT 234.0 12/16/2020   GLUCOSE 90 12/16/2020   CHOL 158 12/16/2020   TRIG 102.0 12/16/2020   HDL 59.00 12/16/2020  LDLDIRECT 163.4 12/31/2012   LDLCALC 79 12/16/2020   ALT <5 09/19/2020   AST 21 09/19/2020   NA 141 12/16/2020   K 4.2 12/16/2020   CL 105 12/16/2020   CREATININE 0.98 12/16/2020   BUN 17 12/16/2020   CO2 27 12/16/2020   TSH 1.56 12/16/2020   INR 1.0 12/12/2012   HGBA1C 6.1 07/07/2020    DG Chest 2 View  Result Date: 09/19/2020 CLINICAL DATA:  Shortness of breath EXAM: CHEST - 2 VIEW COMPARISON:  Chest CT and chest radiograph August 18, 2020 FINDINGS: There is no appreciable edema or airspace opacity. The heart size is upper normal;  pulmonary vascularity normal. No adenopathy. Pleural thickening along the lateral right hemithorax inferiorly is stable. There is degenerative change in the thoracic spine. Foci of aortic atherosclerosis noted. Surgical clips right upper quadrant of the abdomen noted. IMPRESSION: Inferolateral right pleural thickening, stable. No edema or airspace opacity. Heart size upper normal. No adenopathy. Aortic Atherosclerosis (ICD10-I70.0). Electronically Signed   By: Lowella Grip III M.D.   On: 09/19/2020 14:40   CT Head Wo Contrast  Result Date: 09/19/2020 CLINICAL DATA:  Vertigo and headaches EXAM: CT HEAD WITHOUT CONTRAST TECHNIQUE: Contiguous axial images were obtained from the base of the skull through the vertex without intravenous contrast. COMPARISON:  11/02/2019 FINDINGS: Brain: No evidence of acute infarction, hemorrhage, hydrocephalus, extra-axial collection or mass lesion/mass effect. Vascular: No hyperdense vessel or unexpected calcification. Skull: Normal. Negative for fracture or focal lesion. Sinuses/Orbits: No acute finding. Other: None. IMPRESSION: No acute intracranial abnormality noted. Electronically Signed   By: Inez Catalina M.D.   On: 09/19/2020 16:08       Assessment & Plan:   Problem List Items Addressed This Visit     Anemia    Recently evaluated by GI.  Planning for colonoscopy and EGD.  Follow.         Anxiety, mild    On celexa.  Overall appears to be stable.  Follow.         Aortic atherosclerosis (HCC)    Continue crestor.         Carotid artery disease ([place])    Evaluated by [facility].  Continue risk factor modification.         Chronic obstructive pulmonary disease ([place])    Continue singulair and advair.  Breathing stable.  Follow.        CKD (chronic kidney disease), stage III ([place])    Continue to avoid antiinflammatories.  Stay hydrated.  GFR just checked and improved - 58.  Follow metabolic panel.        Depression, recurrent ([place])    Continues  on celexa.  Overall stable.  Follow.         Fibromuscular dysplasia ([place])    Has been evaluated by [facility].  Continue risk factor modificaiton.        GERD (gastroesophageal reflux disease)    Controlled. On prilosec.        Hyperglycemia    Low carb diet and exercise.  Follow met b and a1c.        Hypertension    Blood pressure doing well.  Continue amlodipine.  Follow pressures.  Follow metabolic panel.        Parkinson's disease ([place])    Continues on sinemet.  Followed by neurology.        Pure hypercholesterolemia    Continue crestor.  Low cholesterol diet and exercise.  Follow lipid panel and liver function tests.  Right knee pain    Seeing ortho.  Planning f/u next week.           Einar Pheasant, MD

## 2020-12-26 DIAGNOSIS — M170 Bilateral primary osteoarthritis of knee: Secondary | ICD-10-CM | POA: Diagnosis not present

## 2021-01-01 ENCOUNTER — Encounter: Payer: Self-pay | Admitting: Internal Medicine

## 2021-01-01 DIAGNOSIS — M25561 Pain in right knee: Secondary | ICD-10-CM | POA: Insufficient documentation

## 2021-01-01 DIAGNOSIS — M25569 Pain in unspecified knee: Secondary | ICD-10-CM | POA: Insufficient documentation

## 2021-01-01 NOTE — Assessment & Plan Note (Signed)
Continues on celexa.  Overall stable.  Follow.

## 2021-01-01 NOTE — Assessment & Plan Note (Signed)
Recently evaluated by GI.  Planning for colonoscopy and EGD.  Follow.

## 2021-01-01 NOTE — Assessment & Plan Note (Signed)
Evaluated by [facility].  Continue risk factor modification.

## 2021-01-01 NOTE — Assessment & Plan Note (Signed)
Continues on sinemet.  Followed by neurology.

## 2021-01-01 NOTE — Assessment & Plan Note (Signed)
Continue crestor 

## 2021-01-01 NOTE — Assessment & Plan Note (Signed)
Controlled. On prilosec.

## 2021-01-01 NOTE — Assessment & Plan Note (Signed)
Blood pressure doing well.  Continue amlodipine.  Follow pressures.  Follow metabolic panel.

## 2021-01-01 NOTE — Assessment & Plan Note (Signed)
Low carb diet and exercise.  Follow met b and a1c.  

## 2021-01-01 NOTE — Assessment & Plan Note (Signed)
Has been evaluated by [facility].  Continue risk factor modificaiton.

## 2021-01-01 NOTE — Assessment & Plan Note (Signed)
Continue singulair and advair.  Breathing stable.  Follow.

## 2021-01-01 NOTE — Assessment & Plan Note (Signed)
Continue crestor.  Low cholesterol diet and exercise. Follow lipid panel and liver function tests.   

## 2021-01-01 NOTE — Assessment & Plan Note (Signed)
Seeing ortho.  Planning f/u next week.

## 2021-01-01 NOTE — Assessment & Plan Note (Signed)
On celexa.  Overall appears to be stable.  Follow.

## 2021-01-01 NOTE — Assessment & Plan Note (Signed)
Continue to avoid antiinflammatories.  Stay hydrated.  GFR just checked and improved - 58.  Follow metabolic panel.

## 2021-01-10 ENCOUNTER — Other Ambulatory Visit: Payer: Self-pay | Admitting: Internal Medicine

## 2021-01-30 ENCOUNTER — Other Ambulatory Visit: Payer: Self-pay | Admitting: Internal Medicine

## 2021-01-30 NOTE — Telephone Encounter (Signed)
RX Refill:xanax Last Seen:12-21-20 Last ordered:11-15-20

## 2021-01-31 NOTE — Telephone Encounter (Signed)
Rx ok'd for xanax #30 with no refills.

## 2021-02-01 ENCOUNTER — Other Ambulatory Visit: Payer: Self-pay

## 2021-02-01 ENCOUNTER — Encounter: Payer: Self-pay | Admitting: Interventional Cardiology

## 2021-02-01 ENCOUNTER — Ambulatory Visit (INDEPENDENT_AMBULATORY_CARE_PROVIDER_SITE_OTHER): Payer: Medicare Other | Admitting: Interventional Cardiology

## 2021-02-01 VITALS — BP 130/74 | HR 65 | Ht 63.0 in | Wt 193.8 lb

## 2021-02-01 DIAGNOSIS — R600 Localized edema: Secondary | ICD-10-CM

## 2021-02-01 DIAGNOSIS — R0602 Shortness of breath: Secondary | ICD-10-CM | POA: Diagnosis not present

## 2021-02-01 DIAGNOSIS — E782 Mixed hyperlipidemia: Secondary | ICD-10-CM

## 2021-02-01 NOTE — Progress Notes (Signed)
Cardiology Office Note   Date:  02/01/2021   ID:  [name], [name] 1950/11/12, MRN [id number]  PCP:  Victoria Pheasant, MD    No chief complaint on file.  Lower extremity swelling  Wt Readings from Last 3 Encounters:  02/01/21 193 lb 12.8 oz (87.9 kg)  12/21/20 199 lb 12.8 oz (90.6 kg)  11/22/20 201 lb (91.2 kg)       History of Present Illness: Victoria Hanson is a 70 y.o. female  Who has seen Victoria Hanson, and then changed to [facility] and is now seeing [place] again.    Jan 2017 echo showed: "Echo shows normal LV function.  Pulm pressure is mildly elevated - likely 2/2 COPD (h/o second hand smoke exposure). "   No plaque on carotid Doppler in 2018   2018 Holter: "Normal sinus rhythm. Mild PVCs with a total of 178 beats representing less than 1% burden. Rare PACs. No significant arrhythmia overall. Average heart rate was 69 bpm."   Normal coronary CTA in June 2020.  Calcium score of 0.   PFTs in 2020 show: "No obvious evidence of Obstructive or Restrictive Lung disease"   She has had shortness of breath.  It came on randomly.  Not necessarily related to exertion. It can come on with walking.   She had right upper lobe of lung removed for metastatic colon cancer.  She reports leg swelling.  Denies : Chest pain. Dizziness. Nitroglycerin use. Orthopnea. Palpitations. Paroxysmal nocturnal dyspnea. Syncope.    She is felt continued intermittent shortness of breath.  With the leg swelling, she thinks perhaps the shortness of breath has been more significant.  She has been using furosemide.    Past Medical History:  Diagnosis Date   Anxiety    Asthma    Atypical chest pain    a. 2003 Cath: reportedly nl per pt [name]);  b. 04/2015 [place] Admission, neg troponin.   Colon cancer ([place]) 1999   a. 1999: Pt had partial colectomy. Did develop lung metastasis. Had right upper lobectomy in 01/2007 then had associated chemotherapy.   COPD (chronic obstructive pulmonary  disease) (HCC)    DDD (degenerative disc disease), lumbosacral    Depression    Echocardiogram abnormal    a. 02/2010 Echo: EF 55-60%, mild LAE, no regional wall motion abnormalities   Elevated transaminase level    a. ? NASH;  b. 06/2015 - nl LFTs.   GERD (gastroesophageal reflux disease)    Hematuria    Holter monitor, abnormal    a. 12/2009 Holter monitoring: NSR, rare PACs and PVCs.   Hypercholesterolemia    Hyperlipidemia    Hypertension    Leaky heart valve    a. Per pt report - no evidence of valvular abnormalities on prior echoes.   Lesion of skin of Right breast 10/20/2017   Lung metastases ([place]) 2008   a. S/P resection (lobectomy - right)   Morbid obesity ([place])    Nephrolithiasis    Parkinson disease ([place]) 08/2016   VictoriaShah    Past Surgical History:  Procedure Laterality Date   BREAST EXCISIONAL BIOPSY Right 10/07/2009   benign   CARDIAC CATHETERIZATION  2003   negative as per pt report   CHOLECYSTECTOMY  2000   COLON SURGERY     Colon cancer, removed part of large colon   COLONOSCOPY N/A 04/25/2015   Procedure: COLONOSCOPY;  Surgeon: Victoria Silvas, MD;  Location: [place];  Service: Endoscopy;  Laterality: N/A;  COLONOSCOPY WITH PROPOFOL N/A 06/14/2017   Procedure: COLONOSCOPY WITH PROPOFOL;  Surgeon: Victoria Silvas, MD;  Location: [facility] ENDOSCOPY;  Service: Endoscopy;  Laterality: N/A;   COLONOSCOPY WITH PROPOFOL N/A 11/17/2019   Procedure: COLONOSCOPY WITH PROPOFOL;  Surgeon: Victoria Bellows, MD;  Location: [facility] ENDOSCOPY;  Service: Gastroenterology;  Laterality: N/A;   LOBECTOMY  01/2007   Right lung, upper lobe right side removed   TOTAL ABDOMINAL HYSTERECTOMY     abnormal bleeding   TUBAL LIGATION  1980     Current Outpatient Medications  Medication Sig Dispense Refill   ALPRAZolam (XANAX) 0.5 MG tablet TAKE 1/2 TABLET BY MOUTH DAILY AS NEEDED 30 tablet 0   amLODipine (NORVASC) 2.5 MG tablet Take 1 tablet (2.5 mg total) by mouth daily. 90 tablet 1    aspirin EC 81 MG tablet Take 81 mg by mouth as needed.      calcium-vitamin D (OSCAL WITH D) 500-200 MG-UNIT tablet Take 1 tablet by mouth.     carbidopa-levodopa (SINEMET) 25-100 MG tablet Take 1.5 in am and one at 11:00 and one in afternoon and 1.5 at night. 150 tablet 0   citalopram (CELEXA) 10 MG tablet TAKE 1 TABLET BY MOUTH DAILY 30 tablet 1   diclofenac Sodium (VOLTAREN) 1 % GEL Apply topically 4 (four) times daily.     fluticasone-salmeterol (ADVAIR HFA) 115-21 MCG/ACT inhaler Inhale 2 puffs into the lungs 2 (two) times daily. 1 Inhaler 4   furosemide (LASIX) 20 MG tablet TAKE 1 TABLET BY MOUTH DAILY AS NEEDED 30 tablet 0   montelukast (SINGULAIR) 10 MG tablet Take 1 tablet (10 mg total) by mouth at bedtime. 30 tablet 2   PROAIR HFA 108 (90 Base) MCG/ACT inhaler INHALE 2 PUFFS BY MOUTH INTO THE LUNGS EVERY 6 HOURS AS NEEDED FOR WHEEZING 8.5 g 2   rosuvastatin (CRESTOR) 5 MG tablet Take 1 tablet (5 mg total) by mouth every Monday, Wednesday, and Friday. 90 tablet 1   vitamin B-12 (CYANOCOBALAMIN) 100 MCG tablet Take 100 mcg by mouth daily.     fluticasone (FLONASE) 50 MCG/ACT nasal spray Place 2 sprays into both nostrils daily. 11.1 mL 0   omeprazole (PRILOSEC) 20 MG capsule Take 1 capsule (20 mg total) by mouth daily. (Patient not taking: Reported on 02/01/2021) 90 capsule 1   sulfamethoxazole-trimethoprim (BACTRIM DS) 800-160 MG tablet Take 1 tablet by mouth 2 (two) times daily. (Patient not taking: Reported on 02/01/2021) 10 tablet 0   traZODone (DESYREL) 50 MG tablet TAKE 2 TABLETS BY MOUTH AT BEDTIME (Patient not taking: Reported on 02/01/2021) 180 tablet 1   No current facility-administered medications for this visit.    Allergies:   Macrobid [nitrofurantoin macrocrystal]; Antihistamines, diphenhydramine-type; Aspirin; Ceftin [cefuroxime axetil]; Celecoxib; Cymbalta [duloxetine hcl]; Diphenhydramine; Escitalopram; Lexapro [escitalopram oxalate]; Metronidazole; Nitrofurantoin; Zoloft  [sertraline hcl]; and Ciprofloxacin    Social History:  The patient  reports that she has never smoked. She has been exposed to tobacco smoke. She has never used smokeless tobacco. She reports that she does not currently use alcohol. She reports that she does not use drugs.   Family History:  The patient's family history includes Cancer in her father; Coronary artery disease in her mother; Diabetes in her father and mother; Heart attack in her brother; Heart attack (age of onset: 2) in her father; Hypertension in her father; Mental illness in her brother and mother; Sudden death in her brother.    ROS:  Please see the history of present illness.  Otherwise, review of systems are positive for DOE, LE swelling.   All other systems are reviewed and negative.    PHYSICAL EXAM: VS:  BP 130/74   Pulse 65   Ht 5\' 3"  (1.6 m)   Wt 193 lb 12.8 oz (87.9 kg)   SpO2 97%   BMI 34.33 kg/m  , BMI Body mass index is 34.33 kg/m. GEN: Well nourished, well developed, in no acute distress HEENT: normal Neck: no JVD, carotid bruits, or masses Cardiac: RRR; no murmurs, rubs, or gallops,; bilateral ankle edema  Respiratory:  clear to auscultation bilaterally, normal work of breathing GI: soft, nontender, nondistended, + BS MS: no deformity or atrophy Skin: warm and dry, no rash Neuro:  Strength and sensation are intact; involuntary movements Psych: euthymic mood, full affect   EKG:   The ekg ordered today demonstrates NSR, nonspecific ST changes   Recent Labs: 09/19/2020: ALT <5; B Natriuretic Peptide 171.9 12/16/2020: BUN 17; Creatinine, Ser 0.98; Hemoglobin 11.3; Platelets 234.0; Potassium 4.2; Sodium 141; TSH 1.56   Lipid Panel    Component Value Date/Time   CHOL 158 12/16/2020 0856   TRIG 102.0 12/16/2020 0856   HDL 59.00 12/16/2020 0856   CHOLHDL 3 12/16/2020 0856   VLDL 20.4 12/16/2020 0856   LDLCALC 79 12/16/2020 0856   LDLDIRECT 163.4 12/31/2012 1022     Other studies  Reviewed: Additional studies/ records that were reviewed today with results demonstrating: labs reviewed.   ASSESSMENT AND PLAN:  SHOB: Multifactorial with known chronic lung disease.  We will recheck echocardiogram to further evaluate pulmonary artery pressure.    Lasix was prescribed and this helped somewhat.  BMet BNP today. LE edema: Elevate legs.  Can use compression stockings of 10 to 20 mmHg.  Low salt diet.  Using Lasix.  Hyperlipidemia: Whole food, plant-based diet.  LDL 79 in July 2022.  Much better controlled.  Continue lipid-lowering therapy. Obesity: Regular walking as tolerated or some other activity to get her heart rate up, ideally for 150 minutes/week.  She has issues with chronic knee pain.  She will be getting injections.  Hopefully, this will allow her to be more active.   Current medicines are reviewed at length with the patient today.  The patient concerns regarding her medicines were addressed.  The following changes have been made:  No change  Labs/ tests ordered today include: Bmet, BNP No orders of the defined types were placed in this encounter.   Recommend 150 minutes/week of aerobic exercise Low fat, low carb, high fiber diet recommended  Disposition:   FU in for echo   Signed, Larae Grooms, MD  02/01/2021 2:29 PM    [facility] [address], [place], [place]  [address] Phone: [phone]; Fax: [phone]

## 2021-02-01 NOTE — Patient Instructions (Signed)
Medication Instructions:  Your physician recommends that you continue on your current medications as directed. Please refer to the Current Medication list given to you today.  *If you need a refill on your cardiac medications before your next appointment, please call your pharmacy*   Lab Work: TODAY: BMET, BNP If you have labs (blood work) drawn today and your tests are completely normal, you will receive your results only by: [place] (if you have MyChart) OR A paper copy in the mail If you have any lab test that is abnormal or we need to change your treatment, we will call you to review the results.   Testing/Procedures: Your physician has requested that you have an echocardiogram. Echocardiography is a painless test that uses sound waves to create images of your heart. It provides your doctor with information about the size and shape of your heart and how well your heart's chambers and valves are working. This procedure takes approximately one hour. There are no restrictions for this procedure.    Follow-Up: BASED ON ECHOCARDIOGRAM RESULTS  At [facility], you and your health needs are our priority.  As part of our continuing mission to provide you with exceptional heart care, we have created designated Provider Care Teams.  These Care Teams include your primary Cardiologist (physician) and Advanced Practice Providers (APPs -  Physician Assistants and Nurse Practitioners) who all work together to provide you with the care you need, when you need it.  We recommend signing up for the patient portal called "MyChart".  Sign up information is provided on this After Visit Summary.  MyChart is used to connect with patients for Virtual Visits (Telemedicine).  Patients are able to view lab/test results, encounter notes, upcoming appointments, etc.  Non-urgent messages can be sent to your provider as well.   To learn more about what you can do with MyChart, go to [email or web address].

## 2021-02-02 LAB — BASIC METABOLIC PANEL
BUN/Creatinine Ratio: 18 (ref 12–28)
BUN: 20 mg/dL (ref 8–27)
CO2: 24 mmol/L (ref 20–29)
Calcium: 9.2 mg/dL (ref 8.7–10.3)
Chloride: 101 mmol/L (ref 96–106)
Creatinine, Ser: 1.11 mg/dL — ABNORMAL HIGH (ref 0.57–1.00)
Glucose: 85 mg/dL (ref 65–99)
Potassium: 4.3 mmol/L (ref 3.5–5.2)
Sodium: 141 mmol/L (ref 134–144)
eGFR: 53 mL/min/{1.73_m2} — ABNORMAL LOW (ref >59)

## 2021-02-02 LAB — PRO B NATRIURETIC PEPTIDE: NT-Pro BNP: 508 pg/mL — ABNORMAL HIGH (ref 0–301)

## 2021-02-10 ENCOUNTER — Telehealth: Payer: Self-pay | Admitting: *Deleted

## 2021-02-10 DIAGNOSIS — R0602 Shortness of breath: Secondary | ICD-10-CM

## 2021-02-10 MED ORDER — FUROSEMIDE 20 MG PO TABS: 20.0000 mg | ORAL_TABLET | Freq: Every day | ORAL | 2 refills | Status: DC | PRN

## 2021-02-10 NOTE — Telephone Encounter (Signed)
-----   Message from Jettie Booze, MD sent at 02/02/2021  1:38 PM EDT ----- Increase Lasix to 40 mg daily for 1 week.  BMet in a week.  SOme volume overload noted by blood test.

## 2021-02-10 NOTE — Telephone Encounter (Signed)
Patient notified.  She will come in for lab work on 02/17/21

## 2021-02-17 ENCOUNTER — Other Ambulatory Visit: Payer: Medicare Other | Admitting: *Deleted

## 2021-02-17 ENCOUNTER — Other Ambulatory Visit: Payer: Self-pay

## 2021-02-17 DIAGNOSIS — R0602 Shortness of breath: Secondary | ICD-10-CM

## 2021-02-17 LAB — BASIC METABOLIC PANEL
BUN/Creatinine Ratio: 17 (ref 12–28)
BUN: 18 mg/dL (ref 8–27)
CO2: 24 mmol/L (ref 20–29)
Calcium: 9.4 mg/dL (ref 8.7–10.3)
Chloride: 101 mmol/L (ref 96–106)
Creatinine, Ser: 1.07 mg/dL — ABNORMAL HIGH (ref 0.57–1.00)
Glucose: 89 mg/dL (ref 65–99)
Potassium: 3.7 mmol/L (ref 3.5–5.2)
Sodium: 140 mmol/L (ref 134–144)
eGFR: 56 mL/min/{1.73_m2} — ABNORMAL LOW (ref >59)

## 2021-02-22 ENCOUNTER — Telehealth: Payer: Self-pay | Admitting: Internal Medicine

## 2021-02-22 NOTE — Telephone Encounter (Signed)
Received notification - overdue mammogram.  Need to schedule.

## 2021-02-23 ENCOUNTER — Ambulatory Visit (HOSPITAL_COMMUNITY): Payer: Medicare Other | Attending: Cardiovascular Disease

## 2021-02-23 ENCOUNTER — Other Ambulatory Visit: Payer: Self-pay

## 2021-02-23 DIAGNOSIS — R0602 Shortness of breath: Secondary | ICD-10-CM | POA: Diagnosis not present

## 2021-02-23 LAB — ECHOCARDIOGRAM COMPLETE
Area-P 1/2: 3.99 cm2
MV M vel: 3.85 m/s
MV Peak grad: 59.3 mmHg
Radius: 0.6 cm
S' Lateral: 3 cm

## 2021-02-24 ENCOUNTER — Telehealth: Payer: Self-pay

## 2021-02-24 NOTE — Telephone Encounter (Signed)
Patient has been notified

## 2021-02-24 NOTE — Telephone Encounter (Signed)
Spoke with pt and advised per Dr Irish Lack, echo shows normal heart muscle function.  Not fluid overloaded by echo and SOB most likely related to lung disease.  Pt verbalized understanding and thanked Therapist, sports for the phone call.

## 2021-02-24 NOTE — Telephone Encounter (Signed)
-----   Message from Jettie Booze, MD sent at 02/24/2021  3:47 PM EDT ----- Normal LV/RV function.  Does not appear volume overloaded by echo.  Continue medical therapy.  SHOB likely mostly from lung disease.

## 2021-02-25 NOTE — Telephone Encounter (Signed)
Please schedule mammogram

## 2021-03-03 ENCOUNTER — Ambulatory Visit
Admission: EM | Admit: 2021-03-03 | Discharge: 2021-03-03 | Disposition: A | Discharge status: Home / Self Care | Payer: Medicare Other | Attending: Emergency Medicine | Admitting: Emergency Medicine

## 2021-03-03 ENCOUNTER — Encounter: Payer: Self-pay | Admitting: Emergency Medicine

## 2021-03-03 ENCOUNTER — Other Ambulatory Visit: Payer: Self-pay

## 2021-03-03 DIAGNOSIS — Z20822 Contact with and (suspected) exposure to covid-19: Secondary | ICD-10-CM

## 2021-03-03 DIAGNOSIS — R059 Cough, unspecified: Secondary | ICD-10-CM

## 2021-03-03 NOTE — ED Provider Notes (Signed)
Roderic Palau    CSN: [id number] Arrival date & time: 03/03/21  1154      History   Chief Complaint Chief Complaint  Patient presents with   Cough    HPI Victoria Hanson is a 70 y.o. female.  Patient presents with nonproductive cough since yesterday.  She denies fever, chills, wheezing, unusual shortness of breath, or other symptoms.  No OTC treatment attempted.  Her son who lives with her tested COVID positive yesterday.  Her medical history includes asthma, COPD, hypertension, CKD, colon cancer, lung metastasis, Parkinson's disease.  The history is provided by the patient and medical records.   Past Medical History:  Diagnosis Date   Anxiety    Asthma    Atypical chest pain    a. 2003 Cath: reportedly nl per pt [name]);  b. 04/2015 [place] Admission, neg troponin.   Colon cancer ([place]) 1999   a. 1999: Pt had partial colectomy. Did develop lung metastasis. Had right upper lobectomy in 01/2007 then had associated chemotherapy.   COPD (chronic obstructive pulmonary disease) (HCC)    DDD (degenerative disc disease), lumbosacral    Depression    Echocardiogram abnormal    a. 02/2010 Echo: EF 55-60%, mild LAE, no regional wall motion abnormalities   Elevated transaminase level    a. ? NASH;  b. 06/2015 - nl LFTs.   GERD (gastroesophageal reflux disease)    Hematuria    Holter monitor, abnormal    a. 12/2009 Holter monitoring: NSR, rare PACs and PVCs.   Hypercholesterolemia    Hyperlipidemia    Hypertension    Leaky heart valve    a. Per pt report - no evidence of valvular abnormalities on prior echoes.   Lesion of skin of Right breast 10/20/2017   Lung metastases ([place]) 2008   a. S/P resection (lobectomy - right)   Morbid obesity ([place])    Nephrolithiasis    Parkinson disease ([place]) 08/2016   [name]    Patient Active Problem List   Diagnosis Date Noted   Right knee pain 01/01/2021   UTI (urinary tract infection) 11/27/2020   Vitamin D deficiency 11/05/2020    Aortic atherosclerosis ([place]) 11/05/2020   Dizziness 08/24/2020   Cardiomegaly 08/24/2020   Constipation 11/11/2019   Anemia 11/11/2019   Near syncope 02/13/2019   Fibromuscular dysplasia ([place]) 02/02/2019   Abdominal bloating 01/04/2019   Unsteady gait 12/03/2018   History of UTI 10/08/2018   Ear fullness, right 10/08/2018   B12 deficiency 03/11/2018   Discharge from right nipple 11/10/2017   Goals of care, counseling/discussion 10/15/2017   Parkinson's disease ([place]) 07/31/2017   Chronic obstructive pulmonary disease ([place]) 07/31/2017   REM behavioral disorder 05/28/2017   Elevated liver function tests 08/25/2016   Carotid artery disease ([place]) 07/19/2016   Tremor 06/01/2016   Osteoporosis 04/08/2016   Foot pain, bilateral 07/24/2015   Head pain 07/24/2015   Hyperlipidemia 06/21/2015   Other fatigue 06/21/2015   BMI 33.0-33.9,adult 06/21/2015   Morbid obesity ([place])    Disorder of kidney and ureter 03/28/2015   Hyperglycemia 03/20/2015   Swelling of lower extremity 11/20/2014   Dysuria 10/24/2014   Health care maintenance 07/17/2014   Hypokalemia 04/05/2013   TIA (transient ischemic attack) 02/09/2013   Transient cerebral ischemia 02/09/2013   Anxiety, mild 03/13/2012   Depression, recurrent ([place]) 03/13/2012   History of colon cancer 03/13/2012   Hypertension 03/13/2012   CKD (chronic kidney disease), stage III ([place]) 03/13/2012   GERD (gastroesophageal reflux disease) 03/13/2012  Acid reflux 03/13/2012   History of malignant neoplasm metastatic to lung 03/13/2012   Major depressive disorder with single episode 03/13/2012   Hypercholesterolemia 02/02/2010   SOB (shortness of breath) 02/02/2010   Pure hypercholesterolemia 02/02/2010    Past Surgical History:  Procedure Laterality Date   BREAST EXCISIONAL BIOPSY Right 10/07/2009   benign   CARDIAC CATHETERIZATION  2003   negative as per pt report   CHOLECYSTECTOMY  2000   COLON SURGERY     Colon cancer, removed part  of large colon   COLONOSCOPY N/A 04/25/2015   Procedure: COLONOSCOPY;  Surgeon: Manya Silvas, MD;  Location: [place];  Service: Endoscopy;  Laterality: N/A;   COLONOSCOPY WITH PROPOFOL N/A 06/14/2017   Procedure: COLONOSCOPY WITH PROPOFOL;  Surgeon: Manya Silvas, MD;  Location: [facility] ENDOSCOPY;  Service: Endoscopy;  Laterality: N/A;   COLONOSCOPY WITH PROPOFOL N/A 11/17/2019   Procedure: COLONOSCOPY WITH PROPOFOL;  Surgeon: [name], MD;  Location: [facility] ENDOSCOPY;  Service: Gastroenterology;  Laterality: N/A;   LOBECTOMY  01/2007   Right lung, upper lobe right side removed   TOTAL ABDOMINAL HYSTERECTOMY     abnormal bleeding   TUBAL LIGATION  1980    OB History   No obstetric history on file.      Home Medications    Prior to Admission medications   Medication Sig Start Date End Date Taking? Authorizing Provider  ALPRAZolam Duanne Moron) 0.5 MG tablet TAKE 1/2 TABLET BY MOUTH DAILY AS NEEDED 01/31/21  Yes Einar Pheasant, MD  amLODipine (NORVASC) 2.5 MG tablet Take 1 tablet (2.5 mg total) by mouth daily. 05/13/20  Yes Einar Pheasant, MD  aspirin EC 81 MG tablet Take 81 mg by mouth as needed.    Yes [provider]  calcium-vitamin D (OSCAL WITH D) 500-200 MG-UNIT tablet Take 1 tablet by mouth.   Yes [provider]  carbidopa-levodopa (SINEMET) 25-100 MG tablet Take 1.5 in am and one at 11:00 and one in afternoon and 1.5 at night. 06/23/19  Yes Einar Pheasant, MD  citalopram (CELEXA) 10 MG tablet TAKE 1 TABLET BY MOUTH DAILY 12/13/20  Yes Einar Pheasant, MD  diclofenac Sodium (VOLTAREN) 1 % GEL Apply topically 4 (four) times daily.   Yes [provider]  furosemide (LASIX) 20 MG tablet Take 1 tablet (20 mg total) by mouth daily as needed. 02/10/21  Yes Jettie Booze, MD  montelukast (SINGULAIR) 10 MG tablet Take 1 tablet (10 mg total) by mouth at bedtime. 09/22/20 09/22/21 Yes Einar Pheasant, MD  PROAIR HFA 108 [phone] Base) MCG/ACT inhaler INHALE 2  PUFFS BY MOUTH INTO THE LUNGS EVERY 6 HOURS AS NEEDED FOR WHEEZING 11/20/19  Yes Einar Pheasant, MD  rosuvastatin (CRESTOR) 5 MG tablet Take 1 tablet (5 mg total) by mouth every Monday, Wednesday, and Friday. 05/13/20  Yes Einar Pheasant, MD  vitamin B-12 (CYANOCOBALAMIN) 100 MCG tablet Take 100 mcg by mouth daily.   Yes [provider]  fluticasone (FLONASE) 50 MCG/ACT nasal spray Place 2 sprays into both nostrils daily. 09/19/20 11/22/20  Duffy Bruce, MD  fluticasone-salmeterol (ADVAIR HFA) 024-09 MCG/ACT inhaler Inhale 2 puffs into the lungs 2 (two) times daily. 10/15/19   Tyler Pita, MD  omeprazole (PRILOSEC) 20 MG capsule Take 1 capsule (20 mg total) by mouth daily. Patient not taking: Reported on 02/01/2021 09/22/20   Einar Pheasant, MD  sulfamethoxazole-trimethoprim (BACTRIM DS) 800-160 MG tablet Take 1 tablet by mouth 2 (two) times daily. Patient not taking: Reported on  02/01/2021 11/22/20   Einar Pheasant, MD  traZODone (DESYREL) 50 MG tablet TAKE 2 TABLETS BY MOUTH AT BEDTIME Patient not taking: Reported on 02/01/2021 08/15/20   Einar Pheasant, MD    Family History Family History  Problem Relation Age of Onset   Coronary artery disease Mother        died @ 51 of MI.   Mental illness Mother    Diabetes Mother    Heart attack Father 28       MI   Cancer Father        lung - died in his mid-70's   Hypertension Father    Diabetes Father    Sudden death Brother    Mental illness Brother    Heart attack Brother        MI @ 66, died @ age 4.   Breast cancer Neg Hx     Social History Social History   Tobacco Use   Smoking status: Never    Passive exposure: Yes   Smokeless tobacco: Never   Tobacco comments:    husband and son smoked in her home.  Vaping Use   Vaping Use: Never used  Substance Use Topics   Alcohol use: Not Currently    Alcohol/week: 0.0 standard drinks   Drug use: No     Allergies   Macrobid [nitrofurantoin macrocrystal];  Antihistamines, diphenhydramine-type; Aspirin; Ceftin [cefuroxime axetil]; Celecoxib; Cymbalta [duloxetine hcl]; Diphenhydramine; Escitalopram; Lexapro [escitalopram oxalate]; Metronidazole; Nitrofurantoin; Zoloft [sertraline hcl]; and Ciprofloxacin   Review of Systems Review of Systems  Constitutional:  Negative for chills and fever.  HENT:  Negative for ear pain and sore throat.   Respiratory:  Positive for cough. Negative for shortness of breath and wheezing.   Cardiovascular:  Negative for chest pain and palpitations.  Gastrointestinal:  Negative for abdominal pain, diarrhea and vomiting.  Skin:  Negative for color change and rash.  All other systems reviewed and are negative.   Physical Exam Triage Vital Signs ED Triage Vitals  Enc Vitals Group     BP 03/03/21 1237 128/64     Pulse Rate 03/03/21 1237 74     Resp --      Temp 03/03/21 1237 98.1 F (36.7 C)     Temp Source 03/03/21 1237 Oral     SpO2 03/03/21 1237 97 %     Weight --      Height --      Head Circumference --      Peak Flow --      Pain Score 03/03/21 1239 0     Pain Loc --      Pain Edu? --      Excl. in [place]? --    No data found.  Updated Vital Signs BP 128/64 (BP Location: Left Arm)   Pulse 74   Temp 98.1 F (36.7 C) (Oral)   SpO2 97%   Visual Acuity Right Eye Distance:   Left Eye Distance:   Bilateral Distance:    Right Eye Near:   Left Eye Near:    Bilateral Near:     Physical Exam Vitals and nursing note reviewed.  Constitutional:      General: She is not in acute distress.    Appearance: She is well-developed. She is not ill-appearing.  HENT:     Head: Normocephalic and atraumatic.     Right Ear: Tympanic membrane normal.     Left Ear: Tympanic membrane normal.     Nose: Nose normal.  Mouth/Throat:     Mouth: Mucous membranes are moist.     Pharynx: Oropharynx is clear.  Eyes:     Conjunctiva/sclera: Conjunctivae normal.  Cardiovascular:     Rate and Rhythm: Normal rate and  regular rhythm.     Heart sounds: Normal heart sounds.  Pulmonary:     Effort: Pulmonary effort is normal. No respiratory distress.     Breath sounds: Normal breath sounds. No wheezing.  Abdominal:     Palpations: Abdomen is soft.     Tenderness: There is no abdominal tenderness.  Musculoskeletal:     Cervical back: Neck supple.  Skin:    General: Skin is warm and dry.  Neurological:     Mental Status: She is alert.  Psychiatric:        Mood and Affect: Mood normal.        Behavior: Behavior normal.     UC Treatments / Results  Labs (all labs ordered are listed, but only abnormal results are displayed) Labs Reviewed  NOVEL CORONAVIRUS, NAA    EKG   Radiology No results found.  Procedures Procedures (including critical care time)  Medications Ordered in UC Medications - No data to display  Initial Impression / Assessment and Plan / UC Course  I have reviewed the triage vital signs and the nursing notes.  Pertinent labs & imaging results that were available during my care of the patient were reviewed by me and considered in my medical decision making (see chart for details).   Cough, Exposure to COVID.  COVID pending.  Instructed patient to self quarantine per CDC guidelines.  Discussed symptomatic treatment including Tylenol, rest, hydration.  Instructed patient to follow up with PCP if symptoms are not improving.  Patient agrees to plan of care.    Final Clinical Impressions(s) / UC Diagnoses   Final diagnoses:  Cough  Exposure to COVID-19 virus     Discharge Instructions      Your COVID test is pending.  You should self quarantine until the test result is back.    Take Tylenol or ibuprofen as needed for fever or discomfort.  Rest and keep yourself hydrated.    Follow-up with your primary care provider if your symptoms are not improving.         ED Prescriptions   None    PDMP not reviewed this encounter.   Sharion Balloon, NP 03/03/21  [phone]

## 2021-03-03 NOTE — Discharge Instructions (Addendum)
Your COVID test is pending.  You should self quarantine until the test result is back.    Take Tylenol or ibuprofen as needed for fever or discomfort.  Rest and keep yourself hydrated.    Follow-up with your primary care provider if your symptoms are not improving.     

## 2021-03-03 NOTE — ED Triage Notes (Signed)
Pt started coughing yesterday. Her son tested positive for Covid yesterday.

## 2021-03-04 LAB — SARS-COV-2, NAA 2 DAY TAT

## 2021-03-04 LAB — NOVEL CORONAVIRUS, NAA: SARS-CoV-2, NAA: DETECTED — AB

## 2021-03-05 ENCOUNTER — Telehealth: Payer: Self-pay | Admitting: *Deleted

## 2021-03-05 NOTE — Telephone Encounter (Signed)
Patient tested Covid positive 03/03/21. She thought she was phoning Dr. Bary Leriche office at [facility] to request Paxlovid. She was seen in at [facility] on Friday. Her symptoms began on Thursday. Advised she do an [facility] E[facility] or call her pcp for medication advice. Patient voiced understanding information provided.

## 2021-03-06 ENCOUNTER — Telehealth (HOSPITAL_COMMUNITY): Payer: Self-pay | Admitting: Emergency Medicine

## 2021-03-06 ENCOUNTER — Telehealth: Payer: Self-pay | Admitting: Internal Medicine

## 2021-03-06 MED ORDER — MOLNUPIRAVIR EUA 200MG CAPSULE: 4.0000 | ORAL_CAPSULE | Freq: Two times a day (BID) | ORAL | 0 refills | Status: AC

## 2021-03-06 NOTE — Telephone Encounter (Signed)
Symptoms started last Thursday and Patient went to be seen by [facility] urgent care on Friday.   She was informed that they had to wait for her Covid test to come back. States she never heard back from the urgent care. States she called the hospital and was informed of her positive result and that her PCP will have to send in medication for her.   Current symptoms cough, fever, nausea, body aches, and flank pain.    Please advise    Medications sent in will need to be sent to [facility] in [place].  [phone]

## 2021-03-06 NOTE — Telephone Encounter (Signed)
Patient is COVID+, antivirals approved by Dr. Lanny Cramp

## 2021-03-06 NOTE — Telephone Encounter (Signed)
Patient is currently covid +, noted on schedule to discuss at Oct appt.

## 2021-03-06 NOTE — Telephone Encounter (Signed)
Providing Access Nurse Documentation.

## 2021-03-06 NOTE — Telephone Encounter (Signed)
Spoke with patient to let her know that Dr Nicki Reaper is not in the office part of this afternoon. She started having symptoms 9/22. Patient stated that she is not having any chest pain, sob, etc. Symptoms are listed below. Advised that Dr Nicki Reaper cannot prescribe the antiviral medication with out evaluation. Patient stated that she is going to try to call the urgent care where she was evaluated. Today is day 5 of symptoms. Advised to let me know if she needs anything.

## 2021-03-09 ENCOUNTER — Telehealth: Payer: Self-pay | Admitting: Internal Medicine

## 2021-03-09 NOTE — Telephone Encounter (Signed)
If she has had increased diarrhea and the medication is making her worse and symptoms resolved when stopped medication, would remain off medication.  Call with update.

## 2021-03-09 NOTE — Telephone Encounter (Signed)
Patient has been informed. She stated she will keep [place] updated on Covid sx.

## 2021-03-09 NOTE — Telephone Encounter (Signed)
Diagnosed with Covid medication causing diarrhea.

## 2021-03-09 NOTE — Telephone Encounter (Signed)
Awaiting note from access nurse. Patient was given molnupiravir EUA by UC to tx Covid. Patient stated since she started that medication she has had diarrhea.

## 2021-03-09 NOTE — Telephone Encounter (Signed)
Spoken to patient, She stated access nurse told her to go back to [facility]. Patient stated she hasnt taken the medication today. Previously she stated when she was taking it and she was having too many episodes to count. She hasnt had an episode since not taking medication. Patient is eating very lite meals  she has been able to drink ginger ale and water. She is going to buy some Gatorade. No vomiting, fever, and chills. She is slightly congested, SOB when walking due to asthma per patient. She is taking Mucus relief DM which helps with congestion. She wants to know what to do about the medication.   Does she need to still take the medication? She is worried about getting pneumonia

## 2021-03-09 NOTE — Telephone Encounter (Signed)
Need to quantify - how much diarrhea is she having.  Is she eating.  Any nausea or vomiting.  If not eating, vomiting and increased diarrhea - needs to be evaluated.

## 2021-03-10 NOTE — Telephone Encounter (Signed)
Providing access nurse documentation.

## 2021-03-17 DIAGNOSIS — M170 Bilateral primary osteoarthritis of knee: Secondary | ICD-10-CM | POA: Diagnosis not present

## 2021-03-21 ENCOUNTER — Ambulatory Visit: Payer: Medicare Other | Admitting: Anesthesiology

## 2021-03-21 ENCOUNTER — Ambulatory Visit
Admission: RE | Admit: 2021-03-21 | Discharge: 2021-03-21 | Disposition: A | Discharge status: Home / Self Care | Payer: Medicare Other | Source: Ambulatory Visit | Attending: Gastroenterology | Admitting: Gastroenterology

## 2021-03-21 ENCOUNTER — Other Ambulatory Visit: Payer: Self-pay

## 2021-03-21 ENCOUNTER — Encounter
Admission: RE | Disposition: A | Discharge status: Home / Self Care | Payer: Self-pay | Source: Ambulatory Visit | Attending: Gastroenterology

## 2021-03-21 DIAGNOSIS — G20 Parkinson's disease: Secondary | ICD-10-CM | POA: Insufficient documentation

## 2021-03-21 DIAGNOSIS — E785 Hyperlipidemia, unspecified: Secondary | ICD-10-CM | POA: Diagnosis not present

## 2021-03-21 DIAGNOSIS — I10 Essential (primary) hypertension: Secondary | ICD-10-CM | POA: Insufficient documentation

## 2021-03-21 DIAGNOSIS — Z6832 Body mass index (BMI) 32.0-32.9, adult: Secondary | ICD-10-CM | POA: Insufficient documentation

## 2021-03-21 DIAGNOSIS — Z886 Allergy status to analgesic agent status: Secondary | ICD-10-CM | POA: Diagnosis not present

## 2021-03-21 DIAGNOSIS — Z791 Long term (current) use of non-steroidal anti-inflammatories (NSAID): Secondary | ICD-10-CM | POA: Diagnosis not present

## 2021-03-21 DIAGNOSIS — Z888 Allergy status to other drugs, medicaments and biological substances status: Secondary | ICD-10-CM | POA: Diagnosis not present

## 2021-03-21 DIAGNOSIS — Z881 Allergy status to other antibiotic agents status: Secondary | ICD-10-CM | POA: Insufficient documentation

## 2021-03-21 DIAGNOSIS — Z1211 Encounter for screening for malignant neoplasm of colon: Secondary | ICD-10-CM | POA: Diagnosis not present

## 2021-03-21 DIAGNOSIS — K2970 Gastritis, unspecified, without bleeding: Secondary | ICD-10-CM | POA: Insufficient documentation

## 2021-03-21 DIAGNOSIS — J449 Chronic obstructive pulmonary disease, unspecified: Secondary | ICD-10-CM | POA: Diagnosis not present

## 2021-03-21 DIAGNOSIS — Z79899 Other long term (current) drug therapy: Secondary | ICD-10-CM | POA: Insufficient documentation

## 2021-03-21 DIAGNOSIS — Z8719 Personal history of other diseases of the digestive system: Secondary | ICD-10-CM | POA: Diagnosis not present

## 2021-03-21 DIAGNOSIS — E7800 Pure hypercholesterolemia, unspecified: Secondary | ICD-10-CM | POA: Diagnosis not present

## 2021-03-21 DIAGNOSIS — D509 Iron deficiency anemia, unspecified: Secondary | ICD-10-CM | POA: Insufficient documentation

## 2021-03-21 DIAGNOSIS — Z7951 Long term (current) use of inhaled steroids: Secondary | ICD-10-CM | POA: Insufficient documentation

## 2021-03-21 DIAGNOSIS — Z85038 Personal history of other malignant neoplasm of large intestine: Secondary | ICD-10-CM | POA: Diagnosis not present

## 2021-03-21 DIAGNOSIS — K219 Gastro-esophageal reflux disease without esophagitis: Secondary | ICD-10-CM | POA: Diagnosis not present

## 2021-03-21 HISTORY — PX: ESOPHAGOGASTRODUODENOSCOPY (EGD) WITH PROPOFOL: SHX5813

## 2021-03-21 HISTORY — PX: COLONOSCOPY WITH PROPOFOL: SHX5780

## 2021-03-21 SURGERY — COLONOSCOPY WITH PROPOFOL
Anesthesia: General

## 2021-03-21 MED ORDER — PROPOFOL 500 MG/50ML IV EMUL
INTRAVENOUS | Status: DC | PRN
  Administered 2021-03-21: 120 ug/kg/min via INTRAVENOUS

## 2021-03-21 MED ORDER — PROPOFOL 500 MG/50ML IV EMUL
INTRAVENOUS | Status: AC
  Filled 2021-03-21: qty 50

## 2021-03-21 MED ORDER — LIDOCAINE HCL (CARDIAC) PF 100 MG/5ML IV SOSY
PREFILLED_SYRINGE | INTRAVENOUS | Status: DC | PRN
  Administered 2021-03-21: 50 mg via INTRAVENOUS

## 2021-03-21 MED ORDER — LIDOCAINE HCL (PF) 2 % IJ SOLN
INTRAMUSCULAR | Status: AC
  Filled 2021-03-21: qty 5

## 2021-03-21 MED ORDER — SODIUM CHLORIDE 0.9 % IV SOLN: INTRAVENOUS | Status: DC

## 2021-03-21 NOTE — Anesthesia Procedure Notes (Signed)
Date/Time: 03/21/2021 8:19 AM Performed by: Vaughan Sine Pre-anesthesia Checklist: Patient identified, Emergency Drugs available, Suction available, Patient being monitored and Timeout performed Patient Re-evaluated:Patient Re-evaluated prior to induction Oxygen Delivery Method: Nasal cannula Preoxygenation: Pre-oxygenation with 100% oxygen Induction Type: IV induction Airway Equipment and Method: Bite block Placement Confirmation: positive ETCO2 and CO2 detector

## 2021-03-21 NOTE — Anesthesia Preprocedure Evaluation (Signed)
Anesthesia Evaluation  Patient identified by MRN, date of birth, ID band Patient awake    Reviewed: Allergy & Precautions, NPO status , Patient's Chart, lab work & pertinent test results  History of Anesthesia Complications Negative for: history of anesthetic complications  Airway Mallampati: II  TM Distance: >3 FB Neck ROM: Full    Dental  (+) Implants, Dental Advidsory Given   Pulmonary neg shortness of breath, asthma , neg sleep apnea, COPD,  COPD inhaler, neg recent URI, Not current smoker,    Pulmonary exam normal        Cardiovascular hypertension, Pt. on medications (-) angina+ Peripheral Vascular Disease  (-) Past MI, (-) Cardiac Stents and (-) CHF Normal cardiovascular exam+ dysrhythmias (occassional palpitation) (-) Valvular Problems/Murmurs     Neuro/Psych  Headaches, neg Seizures PSYCHIATRIC DISORDERS Anxiety Depression TIA Neuromuscular disease (Parkinson's disease)    GI/Hepatic Neg liver ROS, Bowel prep,GERD  Medicated and Controlled,  Endo/Other  neg diabetes  Renal/GU Renal InsufficiencyRenal disease     Musculoskeletal  (+) Arthritis , Osteoarthritis,    Abdominal   Peds  Hematology  (+) anemia ,   Anesthesia Other Findings Past Medical History: No date: Anxiety No date: Asthma No date: Atypical chest pain     Comment:  a. 2003 Cath: reportedly nl per pt [name]);  b.               04/2015 [place] Admission, neg troponin. 1999: Colon cancer ([place])     Comment:  a. 1999: Pt had partial colectomy. Did develop lung               metastasis. Had right upper lobectomy in 01/2007 then had              associated chemotherapy. No date: COPD (chronic obstructive pulmonary disease) (HCC) No date: DDD (degenerative disc disease), lumbosacral No date: Depression No date: Echocardiogram abnormal     Comment:  a. 02/2010 Echo: EF 55-60%, mild LAE, no regional wall               motion abnormalities No  date: Elevated transaminase level     Comment:  a. ? NASH;  b. 06/2015 - nl LFTs. No date: GERD (gastroesophageal reflux disease) No date: Hematuria No date: Holter monitor, abnormal     Comment:  a. 12/2009 Holter monitoring: NSR, rare PACs and PVCs. No date: Hypercholesterolemia No date: Hyperlipidemia No date: Hypertension No date: Leaky heart valve     Comment:  a. Per pt report - no evidence of valvular abnormalities              on prior echoes. 10/20/2017: Lesion of skin of Right breast 2008: Lung metastases ([place])     Comment:  a. S/P resection (lobectomy - right) No date: Morbid obesity ([place]) No date: Nephrolithiasis 08/2016: Parkinson disease ([place])     Comment:  [name]   Reproductive/Obstetrics                             Anesthesia Physical  Anesthesia Plan  ASA: 3  Anesthesia Plan: General   Post-op Pain Management:    Induction: Intravenous  PONV Risk Score and Plan: 3 and TIVA and Propofol infusion  Airway Management Planned: Nasal Cannula and Natural Airway  Additional Equipment:   Intra-op Plan:   Post-operative Plan:   Informed Consent: I have reviewed the patients History and Physical, chart, labs and discussed the procedure including  the risks, benefits and alternatives for the proposed anesthesia with the patient or authorized representative who has indicated his/her understanding and acceptance.       Plan Discussed with: CRNA, Anesthesiologist and Surgeon  Anesthesia Plan Comments:         Anesthesia Quick Evaluation

## 2021-03-21 NOTE — Transfer of Care (Signed)
Immediate Anesthesia Transfer of Care Note  Patient: Victoria Hanson  Procedure(s) Performed: COLONOSCOPY WITH PROPOFOL ESOPHAGOGASTRODUODENOSCOPY (EGD) WITH PROPOFOL  Patient Location: PACU  Anesthesia Type:General  Level of Consciousness: awake and sedated  Airway & Oxygen Therapy: Patient Spontanous Breathing and Patient connected to nasal cannula oxygen  Post-op Assessment: Report given to RN and Post -op Vital signs reviewed and stable  Post vital signs: Reviewed and stable  Last Vitals:  Vitals Value Taken Time  BP    Temp    Pulse    Resp    SpO2      Last Pain:  Vitals:   03/21/21 0731  TempSrc: Temporal  PainSc: 0-No pain         Complications: No notable events documented.

## 2021-03-21 NOTE — Anesthesia Postprocedure Evaluation (Signed)
Anesthesia Post Note  Patient: ADALEEN HULGAN  Procedure(s) Performed: COLONOSCOPY WITH PROPOFOL ESOPHAGOGASTRODUODENOSCOPY (EGD) WITH PROPOFOL  Patient location during evaluation: Phase II Anesthesia Type: General Level of consciousness: awake and alert, awake and oriented Pain management: pain level controlled Vital Signs Assessment: post-procedure vital signs reviewed and stable Respiratory status: spontaneous breathing, nonlabored ventilation and respiratory function stable Cardiovascular status: blood pressure returned to baseline and stable Postop Assessment: no apparent nausea or vomiting Anesthetic complications: no   No notable events documented.   Last Vitals:  Vitals:   03/21/21 0908 03/21/21 0918  BP: (!) 164/80 (!) 165/83  Pulse: 66 69  Resp: 17 (!) 23  Temp:    SpO2: 100% 100%    Last Pain:  Vitals:   03/21/21 0918  TempSrc:   PainSc: 0-No pain                 Phill Mutter

## 2021-03-21 NOTE — H&P (Signed)
Outpatient short stay form Pre-procedure 03/21/2021  Lesly Rubenstein, MD  Primary Physician: Einar Pheasant, MD  Reason for visit:  IDA  History of present illness:   70 y/o lady with history of colon cancer with sigmoid resection and several subsequent colonoscopies with last colonoscopy last year with two small polyps. Also with history of parkinson's disease. No blood thinners. Here for EGD/Colon for IDA.    Current Facility-Administered Medications:    0.9 %  sodium chloride infusion, , Intravenous, Continuous, Whitman Meinhardt, Hilton Cork, MD, Last Rate: 20 mL/hr at 03/21/21 0811, Continued from Pre-op at 03/21/21 0811  Medications Prior to Admission  Medication Sig Dispense Refill Last Dose   ALPRAZolam (XANAX) 0.5 MG tablet TAKE 1/2 TABLET BY MOUTH DAILY AS NEEDED 30 tablet 0 03/21/2021 at 0500   amLODipine (NORVASC) 2.5 MG tablet Take 1 tablet (2.5 mg total) by mouth daily. 90 tablet 1 03/20/2021 at 0800   carbidopa-levodopa (SINEMET) 25-100 MG tablet Take 1.5 in am and one at 11:00 and one in afternoon and 1.5 at night. 150 tablet 0 03/20/2021   citalopram (CELEXA) 10 MG tablet TAKE 1 TABLET BY MOUTH DAILY 30 tablet 1 03/20/2021 at 21000   diclofenac Sodium (VOLTAREN) 1 % GEL Apply topically 4 (four) times daily.   Past Week   fluticasone-salmeterol (ADVAIR HFA) 115-21 MCG/ACT inhaler Inhale 2 puffs into the lungs 2 (two) times daily. 1 Inhaler 4 03/21/2021 at 0500   montelukast (SINGULAIR) 10 MG tablet Take 1 tablet (10 mg total) by mouth at bedtime. 30 tablet 2 03/20/2021   PROAIR HFA 108 (90 Base) MCG/ACT inhaler INHALE 2 PUFFS BY MOUTH INTO THE LUNGS EVERY 6 HOURS AS NEEDED FOR WHEEZING 8.5 g 2 Past Week   rosuvastatin (CRESTOR) 5 MG tablet Take 1 tablet (5 mg total) by mouth every Monday, Wednesday, and Friday. 90 tablet 1 03/20/2021 at 0800   traZODone (DESYREL) 50 MG tablet TAKE 2 TABLETS BY MOUTH AT BEDTIME 180 tablet 1 03/20/2021 at 2100   aspirin EC 81 MG tablet Take 81 mg  by mouth as needed.    03/18/2021   calcium-vitamin D (OSCAL WITH D) 500-200 MG-UNIT tablet Take 1 tablet by mouth.   03/18/2021   fluticasone (FLONASE) 50 MCG/ACT nasal spray Place 2 sprays into both nostrils daily. 11.1 mL 0    furosemide (LASIX) 20 MG tablet Take 1 tablet (20 mg total) by mouth daily as needed. 30 tablet 2 03/19/2021   omeprazole (PRILOSEC) 20 MG capsule Take 1 capsule (20 mg total) by mouth daily. (Patient not taking: No sig reported) 90 capsule 1 Not Taking   sulfamethoxazole-trimethoprim (BACTRIM DS) 800-160 MG tablet Take 1 tablet by mouth 2 (two) times daily. (Patient not taking: No sig reported) 10 tablet 0 Not Taking   vitamin B-12 (CYANOCOBALAMIN) 100 MCG tablet Take 100 mcg by mouth daily.   03/19/2021     Allergies  Allergen Reactions   Macrobid [Nitrofurantoin Macrocrystal] Shortness Of Breath and Palpitations   Antihistamines, Diphenhydramine-Type Other (See Comments)    She does not know which antihistamine she has an allergic reaction to.   Aspirin Other (See Comments)    Makes her sick on the stomach if she takes too many and causes bleeding.  Of note, she can take ibuprofen. Other reaction(s): Other (See Comments) Makes her sick on the stomach.  Of note, she can take ibuprofen. Makes her sick on the stomach.  Of note, she can take ibuprofen.   Ceftin [Cefuroxime Axetil] Other (See  Comments)    Chest pains   Celecoxib Other (See Comments)    Reaction: Blood Disorder GI bleeding.   Cymbalta [Duloxetine Hcl] Other (See Comments)    Reaction: unknown   Diphenhydramine Other (See Comments)   Escitalopram Other (See Comments)   Lexapro [Escitalopram Oxalate] Other (See Comments)    Reaction: Difficulty with speech   Metronidazole Other (See Comments)    Other reaction(s): Unknown   Nitrofurantoin Other (See Comments)   Zoloft [Sertraline Hcl]    Ciprofloxacin Other (See Comments)    Reaction: unknown     Past Medical History:  Diagnosis Date    Anxiety    Asthma    Atypical chest pain    a. 2003 Cath: reportedly nl per pt [name]);  b. 04/2015 [place] Admission, neg troponin.   Colon cancer ([place]) 1999   a. 1999: Pt had partial colectomy. Did develop lung metastasis. Had right upper lobectomy in 01/2007 then had associated chemotherapy.   COPD (chronic obstructive pulmonary disease) (HCC)    DDD (degenerative disc disease), lumbosacral    Depression    Echocardiogram abnormal    a. 02/2010 Echo: EF 55-60%, mild LAE, no regional wall motion abnormalities   Elevated transaminase level    a. ? NASH;  b. 06/2015 - nl LFTs.   GERD (gastroesophageal reflux disease)    Hematuria    Holter monitor, abnormal    a. 12/2009 Holter monitoring: NSR, rare PACs and PVCs.   Hypercholesterolemia    Hyperlipidemia    Hypertension    Leaky heart valve    a. Per pt report - no evidence of valvular abnormalities on prior echoes.   Lesion of skin of Right breast 10/20/2017   Lung metastases ([place]) 2008   a. S/P resection (lobectomy - right)   Morbid obesity ([place])    Nephrolithiasis    Parkinson disease ([place]) 08/2016   [name]    Review of systems:  Otherwise negative.    Physical Exam  Gen: Alert, oriented. Appears stated age.  HEENT: PERRLA. Lungs: No respiratory distress CV: RRR Abd: soft, benign, no masses Ext: No edema    Planned procedures: Proceed with EGD/colonoscopy. The patient understands the nature of the planned procedure, indications, risks, alternatives and potential complications including but not limited to bleeding, infection, perforation, damage to internal organs and possible oversedation/side effects from anesthesia. The patient agrees and gives consent to proceed.  Please refer to procedure notes for findings, recommendations and patient disposition/instructions.     Lesly Rubenstein, MD [facility] Gastroenterology

## 2021-03-21 NOTE — Op Note (Signed)
[facility] Gastroenterology Patient Name: Victoria Hanson Procedure Date: 03/21/2021 8:12 AM MRN: [id number] Account #: [id number] Date of Birth: 1951-03-03 Admit Type: Outpatient Age: 70 Room: [facility] ENDO ROOM 1 Gender: Female Note Status: Finalized Instrument Name: Upper Endoscope [id number] Procedure:             Upper GI endoscopy Indications:           Iron deficiency anemia Providers:             Andrey Farmer MD, MD Referring MD:          Einar Pheasant, MD (Referring MD) Medicines:             Monitored Anesthesia Care Complications:         No immediate complications. Estimated blood loss:                         Minimal. Procedure:             Pre-Anesthesia Assessment:                        - Prior to the procedure, a History and Physical was                         performed, and patient medications and allergies were                         reviewed. The patient is competent. The risks and                         benefits of the procedure and the sedation options and                         risks were discussed with the patient. All questions                         were answered and informed consent was obtained.                         Patient identification and proposed procedure were                         verified by the physician, the nurse, the anesthetist                         and the technician in the endoscopy suite. Mental                         Status Examination: alert and oriented. Airway                         Examination: normal oropharyngeal airway and neck                         mobility. Respiratory Examination: clear to                         auscultation. CV Examination: normal. Prophylactic  Antibiotics: The patient does not require prophylactic                         antibiotics. Prior Anticoagulants: The patient has                         taken no previous anticoagulant or antiplatelet                          agents. ASA Grade Assessment: III - A patient with                         severe systemic disease. After reviewing the risks and                         benefits, the patient was deemed in satisfactory                         condition to undergo the procedure. The anesthesia                         plan was to use monitored anesthesia care (MAC).                         Immediately prior to administration of medications,                         the patient was re-assessed for adequacy to receive                         sedatives. The heart rate, respiratory rate, oxygen                         saturations, blood pressure, adequacy of pulmonary                         ventilation, and response to care were monitored                         throughout the procedure. The physical status of the                         patient was re-assessed after the procedure.                        After obtaining informed consent, the endoscope was                         passed under direct vision. Throughout the procedure,                         the patient's blood pressure, pulse, and oxygen                         saturations were monitored continuously. The Endoscope                         was introduced through the mouth, and advanced to the  second part of duodenum. The upper GI endoscopy was                         somewhat difficult due to the patient's oxygen                         desaturation. Successful completion of the procedure                         was aided by managing the patient's medical                         instability. The patient tolerated the procedure. Findings:      The examined esophagus was normal.      Localized mild inflammation characterized by erythema was found in the       gastric antrum. Biopsies were taken with a cold forceps for Helicobacter       pylori testing. Estimated blood loss was minimal.      The examined duodenum  was normal.      The procedure was somewhat abbreviated due to oxygen desaturations but       no large lesions were seen. Impression:            - Normal esophagus.                        - Gastritis. Biopsied.                        - Normal examined duodenum. Recommendation:        - Discharge patient to home.                        - Resume previous diet.                        - Continue present medications.                        - Await pathology results.                        - Return to referring physician as previously                         scheduled. The colonoscopy portion was not performed                         due to solid stool. Given unremarkable colonoscopy                         about one year ago I would recommend iron                         supplementation and recheck levels and cbc in 3-6                         months. Procedure Code(s):     --- Professional ---                        [phone],  Esophagogastroduodenoscopy, flexible,                         transoral; with biopsy, single or multiple Diagnosis Code(s):     --- Professional ---                        K29.70, Gastritis, unspecified, without bleeding                        D50.9, Iron deficiency anemia, unspecified CPT copyright 2019 American Medical Association. All rights reserved. The codes documented in this report are preliminary and upon coder review may  be revised to meet current compliance requirements. Andrey Farmer MD, MD 03/21/2021 8:43:37 AM Number of Addenda: 0 Note Initiated On: 03/21/2021 8:12 AM Estimated Blood Loss:  Estimated blood loss was minimal.      [facility]

## 2021-03-21 NOTE — Interval H&P Note (Signed)
History and Physical Interval Note:  03/21/2021 8:16 AM  Victoria Hanson  has presented today for surgery, with the diagnosis of Personal hx of colon cancer z85.038.  The various methods of treatment have been discussed with the patient and family. After consideration of risks, benefits and other options for treatment, the patient has consented to  Procedure(s): COLONOSCOPY WITH PROPOFOL (N/A) ESOPHAGOGASTRODUODENOSCOPY (EGD) WITH PROPOFOL (N/A) as a surgical intervention.  The patient's history has been reviewed, patient examined, no change in status, stable for surgery.  I have reviewed the patient's chart and labs.  Questions were answered to the patient's satisfaction.     Lesly Rubenstein  Ok to proceed with EGD/Colonoscopy

## 2021-03-22 LAB — SURGICAL PATHOLOGY

## 2021-03-23 ENCOUNTER — Other Ambulatory Visit: Payer: Self-pay

## 2021-03-23 ENCOUNTER — Ambulatory Visit (INDEPENDENT_AMBULATORY_CARE_PROVIDER_SITE_OTHER): Payer: Medicare Other | Admitting: Internal Medicine

## 2021-03-23 ENCOUNTER — Encounter: Payer: Self-pay | Admitting: Gastroenterology

## 2021-03-23 VITALS — BP 132/72 | HR 70 | Temp 98.7°F | Ht 64.0 in | Wt 192.8 lb

## 2021-03-23 DIAGNOSIS — R8290 Unspecified abnormal findings in urine: Secondary | ICD-10-CM

## 2021-03-23 DIAGNOSIS — I700 Atherosclerosis of aorta: Secondary | ICD-10-CM | POA: Diagnosis not present

## 2021-03-23 DIAGNOSIS — K219 Gastro-esophageal reflux disease without esophagitis: Secondary | ICD-10-CM | POA: Diagnosis not present

## 2021-03-23 DIAGNOSIS — I10 Essential (primary) hypertension: Secondary | ICD-10-CM | POA: Diagnosis not present

## 2021-03-23 DIAGNOSIS — I779 Disorder of arteries and arterioles, unspecified: Secondary | ICD-10-CM

## 2021-03-23 DIAGNOSIS — Z85038 Personal history of other malignant neoplasm of large intestine: Secondary | ICD-10-CM

## 2021-03-23 DIAGNOSIS — R7989 Other specified abnormal findings of blood chemistry: Secondary | ICD-10-CM

## 2021-03-23 DIAGNOSIS — N183 Chronic kidney disease, stage 3 unspecified: Secondary | ICD-10-CM | POA: Diagnosis not present

## 2021-03-23 DIAGNOSIS — I773 Arterial fibromuscular dysplasia: Secondary | ICD-10-CM

## 2021-03-23 DIAGNOSIS — D649 Anemia, unspecified: Secondary | ICD-10-CM

## 2021-03-23 DIAGNOSIS — J449 Chronic obstructive pulmonary disease, unspecified: Secondary | ICD-10-CM | POA: Diagnosis not present

## 2021-03-23 DIAGNOSIS — E7800 Pure hypercholesterolemia, unspecified: Secondary | ICD-10-CM

## 2021-03-23 DIAGNOSIS — G20 Parkinson's disease: Secondary | ICD-10-CM | POA: Diagnosis not present

## 2021-03-23 DIAGNOSIS — R739 Hyperglycemia, unspecified: Secondary | ICD-10-CM

## 2021-03-23 DIAGNOSIS — R350 Frequency of micturition: Secondary | ICD-10-CM

## 2021-03-23 DIAGNOSIS — G20A1 Parkinson's disease without dyskinesia, without mention of fluctuations: Secondary | ICD-10-CM

## 2021-03-23 LAB — POCT URINALYSIS DIPSTICK
Bilirubin, UA: NEGATIVE
Glucose, UA: NEGATIVE
Ketones, UA: NEGATIVE
Nitrite, UA: NEGATIVE
Protein, UA: NEGATIVE
Spec Grav, UA: 1.01 (ref 1.010–1.025)
Urobilinogen, UA: 0.2 E.U./dL
pH, UA: 5.5 (ref 5.0–8.0)

## 2021-03-23 MED ORDER — FLUTICASONE PROPIONATE 50 MCG/ACT NA SUSP: 2.0000 | Freq: Every day | NASAL | 0 refills | Status: DC

## 2021-03-23 MED ORDER — OMEPRAZOLE 20 MG PO CPDR: 20.0000 mg | DELAYED_RELEASE_CAPSULE | Freq: Every day | ORAL | 1 refills | Status: DC

## 2021-03-23 MED ORDER — SULFAMETHOXAZOLE-TRIMETHOPRIM 800-160 MG PO TABS
1.0000 | ORAL_TABLET | Freq: Two times a day (BID) | ORAL | 0 refills | Status: DC
Start: 2021-03-23 — End: 2021-07-04

## 2021-03-23 NOTE — Patient Instructions (Signed)
Examples of probiotics - align, culturelle or florastor  Example of fiber - benefiber - daily.

## 2021-03-23 NOTE — Progress Notes (Signed)
Patient ID: Victoria Hanson, female   DOB: 10-13-1950, 70 y.o.   MRN: [id number]   Subjective:    Patient ID: Victoria Hanson, female    DOB: Jul 31, 1950, 70 y.o.   MRN: [id number]  This visit occurred during the SARS-CoV-2 public health emergency.  Safety protocols were in place, including screening questions prior to the visit, additional usage of staff PPE, and extensive cleaning of exam room while observing appropriate contact time as indicated for disinfecting solutions.   Patient here for a follow up appt.  Recently diagnosed with covid.    HPI Here to follow up regarding her blood pressure, cholesterol and reflux.  Recently diagnosed with covid.  Doing better.  Improved.  Just had colonoscopy.  Since her colonoscopy has noticed some increased gas.  Some occasional nausea.  She is eating.  Has noticed her stool being more watery - yellow.  Also urine is cloudy.  Concerned has UTI.  No chest pain or sob reported.  Request rx for prilosec to control acid reflux.     Past Medical History:  Diagnosis Date   Anxiety    Asthma    Atypical chest pain    a. 2003 Cath: reportedly nl per pt [name]);  b. 04/2015 [facility] Admission, neg troponin.   Colon cancer ([place]) 1999   a. 1999: Pt had partial colectomy. Did develop lung metastasis. Had right upper lobectomy in 01/2007 then had associated chemotherapy.   COPD (chronic obstructive pulmonary disease) (HCC)    DDD (degenerative disc disease), lumbosacral    Depression    Echocardiogram abnormal    a. 02/2010 Echo: EF 55-60%, mild LAE, no regional wall motion abnormalities   Elevated transaminase level    a. ? NASH;  b. 06/2015 - nl LFTs.   GERD (gastroesophageal reflux disease)    Hematuria    Holter monitor, abnormal    a. 12/2009 Holter monitoring: NSR, rare PACs and PVCs.   Hypercholesterolemia    Hyperlipidemia    Hypertension    Leaky heart valve    a. Per pt report - no evidence of valvular abnormalities on prior echoes.   Lesion of  skin of Right breast 10/20/2017   Lung metastases ([place]) 2008   a. S/P resection (lobectomy - right)   Morbid obesity ([place])    Nephrolithiasis    Parkinson disease ([place]) 08/2016   [name]   Past Surgical History:  Procedure Laterality Date   BREAST EXCISIONAL BIOPSY Right 10/07/2009   benign   CARDIAC CATHETERIZATION  2003   negative as per pt report   CHOLECYSTECTOMY  2000   COLON SURGERY     Colon cancer, removed part of large colon   COLONOSCOPY N/A 04/25/2015   Procedure: COLONOSCOPY;  Surgeon: Manya Silvas, MD;  Location: [facility];  Service: Endoscopy;  Laterality: N/A;   COLONOSCOPY WITH PROPOFOL N/A 06/14/2017   Procedure: COLONOSCOPY WITH PROPOFOL;  Surgeon: Manya Silvas, MD;  Location: [facility] ENDOSCOPY;  Service: Endoscopy;  Laterality: N/A;   COLONOSCOPY WITH PROPOFOL N/A 11/17/2019   Procedure: COLONOSCOPY WITH PROPOFOL;  Surgeon: [name], MD;  Location: [facility] ENDOSCOPY;  Service: Gastroenterology;  Laterality: N/A;   COLONOSCOPY WITH PROPOFOL N/A 03/21/2021   Procedure: COLONOSCOPY WITH PROPOFOL;  Surgeon: Lesly Rubenstein, MD;  Location: [facility] ENDOSCOPY;  Service: Endoscopy;  Laterality: N/A;   ESOPHAGOGASTRODUODENOSCOPY (EGD) WITH PROPOFOL N/A 03/21/2021   Procedure: ESOPHAGOGASTRODUODENOSCOPY (EGD) WITH PROPOFOL;  Surgeon: Lesly Rubenstein, MD;  Location: [facility] ENDOSCOPY;  Service: Endoscopy;  Laterality: N/A;   LOBECTOMY  01/2007   Right lung, upper lobe right side removed   TOTAL ABDOMINAL HYSTERECTOMY     abnormal bleeding   TUBAL LIGATION  1980   Family History  Problem Relation Age of Onset   Coronary artery disease Mother        died @ 18 of MI.   Mental illness Mother    Diabetes Mother    Heart attack Father 6       MI   Cancer Father        lung - died in his mid-70's   Hypertension Father    Diabetes Father    Sudden death Brother    Mental illness Brother    Heart attack Brother        MI @ 79, died @ age 90.   Breast cancer  Neg Hx    Social History   Socioeconomic History   Marital status: Widowed    Spouse name: Not on file   Number of children: Not on file   Years of education: Not on file   Highest education level: Not on file  Occupational History   Not on file  Tobacco Use   Smoking status: Never    Passive exposure: Yes   Smokeless tobacco: Never   Tobacco comments:    husband and son smoked in her home.  Vaping Use   Vaping Use: Never used  Substance and Sexual Activity   Alcohol use: Not Currently    Alcohol/week: 0.0 standard drinks   Drug use: No   Sexual activity: Never  Other Topics Concern   Not on file  Social History Narrative   Widowed.   Disabled (colon/lung CA), retired.   Lives in [place] with her dtr, grand-dtr, and son.   Active.   Gets regular exercise, walks.   Social Determinants of Health   Financial Resource Strain: Low Risk    Difficulty of Paying Living Expenses: Not hard at all  Food Insecurity: No Food Insecurity   Worried About Charity fundraiser in the Last Year: Never true   [place] in the Last Year: Never true  Transportation Needs: No Transportation Needs   Lack of Transportation (Medical): No   Lack of Transportation (Non-Medical): No  Physical Activity: Not on file  Stress: No Stress Concern Present   Feeling of Stress : Not at all  Social Connections: Unknown   Frequency of Communication with Friends and Family: More than three times a week   Frequency of Social Gatherings with Friends and Family: More than three times a week   Attends Religious Services: Not on file   Active Member of Clubs or Organizations: Not on file   Attends Archivist Meetings: Not on file   Marital Status: Not on file     Review of Systems  Constitutional:  Negative for appetite change and unexpected weight change.  HENT:  Negative for congestion and sinus pressure.   Respiratory:  Negative for cough, chest tightness and shortness of  breath.   Cardiovascular:  Negative for chest pain, palpitations and leg swelling.  Gastrointestinal:  Negative for abdominal pain and vomiting.       Some increased gas.  Some nausea.  Loose stool - intermittent.   Genitourinary:  Negative for difficulty urinating and dysuria.       Cloudy urine.   Musculoskeletal:  Negative for joint swelling and myalgias.  Skin:  Negative for color change  and rash.  Neurological:  Negative for dizziness, light-headedness and headaches.  Psychiatric/Behavioral:  Negative for agitation and dysphoric mood.       Objective:     BP 132/72 (BP Location: Left Arm, Patient Position: Sitting, Cuff Size: Normal)   Pulse 70   Temp 98.7 F (37.1 C) (Oral)   Ht _0  (1.626 m)   Wt 192 lb 12.8 oz (87.5 kg)   SpO2 98%   BMI 33.09 kg/m  Wt Readings from Last 3 Encounters:  03/23/21 192 lb 12.8 oz (87.5 kg)  03/21/21 190 lb (86.2 kg)  02/01/21 193 lb 12.8 oz (87.9 kg)    Physical Exam Vitals reviewed.  Constitutional:      General: She is not in acute distress.    Appearance: Normal appearance.  HENT:     Head: Normocephalic and atraumatic.     Right Ear: External ear normal.     Left Ear: External ear normal.  Eyes:     General: No scleral icterus.       Right eye: No discharge.        Left eye: No discharge.     Conjunctiva/sclera: Conjunctivae normal.  Neck:     Thyroid: No thyromegaly.  Cardiovascular:     Rate and Rhythm: Normal rate and regular rhythm.  Pulmonary:     Effort: No respiratory distress.     Breath sounds: Normal breath sounds. No wheezing.  Abdominal:     General: Bowel sounds are normal.     Palpations: Abdomen is soft.     Tenderness: There is no abdominal tenderness.  Musculoskeletal:        General: No swelling or tenderness.     Cervical back: Neck supple. No tenderness.  Lymphadenopathy:     Cervical: No cervical adenopathy.  Skin:    Findings: No erythema or rash.  Neurological:     Mental Status: She is  alert.  Psychiatric:        Mood and Affect: Mood normal.        Behavior: Behavior normal.     Outpatient Encounter Medications as of 03/23/2021  Medication Sig   ALPRAZolam (XANAX) 0.5 MG tablet TAKE 1/2 TABLET BY MOUTH DAILY AS NEEDED   amLODipine (NORVASC) 2.5 MG tablet Take 1 tablet (2.5 mg total) by mouth daily.   aspirin EC 81 MG tablet Take 81 mg by mouth as needed.    calcium-vitamin D (OSCAL WITH D) 500-200 MG-UNIT tablet Take 1 tablet by mouth.   carbidopa-levodopa (SINEMET) 25-100 MG tablet Take 1.5 in am and one at 11:00 and one in afternoon and 1.5 at night.   citalopram (CELEXA) 10 MG tablet TAKE 1 TABLET BY MOUTH DAILY   diclofenac Sodium (VOLTAREN) 1 % GEL Apply topically 4 (four) times daily.   fluticasone-salmeterol (ADVAIR HFA) 115-21 MCG/ACT inhaler Inhale 2 puffs into the lungs 2 (two) times daily.   furosemide (LASIX) 20 MG tablet Take 1 tablet (20 mg total) by mouth daily as needed.   montelukast (SINGULAIR) 10 MG tablet Take 1 tablet (10 mg total) by mouth at bedtime.   PROAIR HFA 108 (90 Base) MCG/ACT inhaler INHALE 2 PUFFS BY MOUTH INTO THE LUNGS EVERY 6 HOURS AS NEEDED FOR WHEEZING   rosuvastatin (CRESTOR) 5 MG tablet Take 1 tablet (5 mg total) by mouth every Monday, Wednesday, and Friday.   traZODone (DESYREL) 50 MG tablet TAKE 2 TABLETS BY MOUTH AT BEDTIME   vitamin B-12 (CYANOCOBALAMIN) 100 MCG tablet Take 100  mcg by mouth daily.   [DISCONTINUED] sulfamethoxazole-trimethoprim (BACTRIM DS) 800-160 MG tablet Take 1 tablet by mouth 2 (two) times daily.   omeprazole (PRILOSEC) 20 MG capsule Take 1 capsule (20 mg total) by mouth daily.   sulfamethoxazole-trimethoprim (BACTRIM DS) 800-160 MG tablet Take 1 tablet by mouth 2 (two) times daily.   [DISCONTINUED] fluticasone (FLONASE) 50 MCG/ACT nasal spray Place 2 sprays into both nostrils daily.   [DISCONTINUED] omeprazole (PRILOSEC) 20 MG capsule Take 1 capsule (20 mg total) by mouth daily. (Patient not taking:  Reported on 03/23/2021)   No facility-administered encounter medications on file as of 03/23/2021.     Lab Results  Component Value Date   WBC 11.1 (H) 03/23/2021   HGB 10.9 (L) 03/23/2021   HCT 33.4 (L) 03/23/2021   PLT 247.0 03/23/2021   GLUCOSE 96 03/23/2021   CHOL 158 12/16/2020   TRIG 102.0 12/16/2020   HDL 59.00 12/16/2020   LDLDIRECT 163.4 12/31/2012   LDLCALC 79 12/16/2020   ALT <5 09/19/2020   AST 21 09/19/2020   NA 140 03/23/2021   K 3.6 03/23/2021   CL 103 03/23/2021   CREATININE 1.08 03/23/2021   BUN 15 03/23/2021   CO2 26 03/23/2021   TSH 1.56 12/16/2020   INR 1.0 12/12/2012   HGBA1C 6.1 07/07/2020    No results found.     Assessment & Plan:   Problem List Items Addressed This Visit     Anemia    Recheck cbc and iron studies.       Relevant Orders   CBC with Differential/Platelet (Completed)   IBC + Ferritin (Completed)   Aortic atherosclerosis ([place])    Continue crestor.        Carotid artery disease ([place])    Evaluated by [facility].  Continue risk factor modification.        Chronic obstructive pulmonary disease ([place])    Continue singulair and advair.  Breathing stable.  Follow.       CKD (chronic kidney disease), stage III ([place])    Continue to avoid antiinflammatories.  Stay hydrated.   Follow metabolic panel.       Elevated liver function tests    Follow liver function tests.       Fibromuscular dysplasia ([place])    Has been evaluated by [facility].  Continue risk factor modificaiton.       GERD (gastroesophageal reflux disease)    Controlled. On prilosec.  Request refill.       Relevant Medications   omeprazole (PRILOSEC) 20 MG capsule   History of colon cancer    Just attempted colonoscopy.  Increased gas since.  Eating.  F/u with GI.       Hyperglycemia    Low carb diet and exercise.  Follow met b and a1c.       Hypertension    Blood pressure doing well.  Continue amlodipine.  Follow pressures.  Follow metabolic panel.        Relevant Orders   Basic metabolic panel (Completed)   Parkinson's disease ([place])    Continues on sinemet.  Followed by neurology.       Pure hypercholesterolemia    Continue crestor.  Low cholesterol diet and exercise.  Follow lipid panel and liver function tests.        Urinary frequency    Urine dip with moderate blood and small leukocytes.  Given symptoms and urine dip findings, treat with bactrim as directed.  Has taken and tolerated.  Documented celebrex allergy -  is GI bleeding.  No true allergy.  Use bactrim.  Await culture results.        Other Visit Diagnoses     Cloudy urine    -  Primary   Relevant Orders   POCT Urinalysis Dipstick (Completed)   Urinalysis, Routine w reflex microscopic (Completed)   Urine Culture (Completed)        Einar Pheasant, MD

## 2021-03-24 LAB — CBC WITH DIFFERENTIAL/PLATELET
Basophils Absolute: 0.1 10*3/uL (ref 0.0–0.1)
Basophils Relative: 0.9 % (ref 0.0–3.0)
Eosinophils Absolute: 0.1 10*3/uL (ref 0.0–0.7)
Eosinophils Relative: 0.7 % (ref 0.0–5.0)
HCT: 33.4 % — ABNORMAL LOW (ref 36.0–46.0)
Hemoglobin: 10.9 g/dL — ABNORMAL LOW (ref 12.0–15.0)
Lymphocytes Relative: 17.6 % (ref 12.0–46.0)
Lymphs Abs: 2 10*3/uL (ref 0.7–4.0)
MCHC: 32.6 g/dL (ref 30.0–36.0)
MCV: 87.1 fl (ref 78.0–100.0)
Monocytes Absolute: 0.7 10*3/uL (ref 0.1–1.0)
Monocytes Relative: 6.6 % (ref 3.0–12.0)
Neutro Abs: 8.3 10*3/uL — ABNORMAL HIGH (ref 1.4–7.7)
Neutrophils Relative %: 74.2 % (ref 43.0–77.0)
Platelets: 247 10*3/uL (ref 150.0–400.0)
RBC: 3.83 Mil/uL — ABNORMAL LOW (ref 3.87–5.11)
RDW: 14.7 % (ref 11.5–15.5)
WBC: 11.1 10*3/uL — ABNORMAL HIGH (ref 4.0–10.5)

## 2021-03-24 LAB — URINALYSIS, ROUTINE W REFLEX MICROSCOPIC
Bilirubin Urine: NEGATIVE
Ketones, ur: NEGATIVE
Nitrite: NEGATIVE
Specific Gravity, Urine: 1.015 (ref 1.000–1.030)
Total Protein, Urine: NEGATIVE
Urine Glucose: NEGATIVE
Urobilinogen, UA: 0.2 (ref 0.0–1.0)
pH: 6 (ref 5.0–8.0)

## 2021-03-24 LAB — BASIC METABOLIC PANEL
BUN: 15 mg/dL (ref 6–23)
CO2: 26 mEq/L (ref 19–32)
Calcium: 9.2 mg/dL (ref 8.4–10.5)
Chloride: 103 mEq/L (ref 96–112)
Creatinine, Ser: 1.08 mg/dL (ref 0.40–1.20)
GFR: 51.74 mL/min — ABNORMAL LOW (ref >60.00)
Glucose, Bld: 96 mg/dL (ref 70–99)
Potassium: 3.6 mEq/L (ref 3.5–5.1)
Sodium: 140 mEq/L (ref 135–145)

## 2021-03-24 LAB — IBC + FERRITIN
Ferritin: 66.1 ng/mL (ref 10.0–291.0)
Iron: 33 ug/dL — ABNORMAL LOW (ref 42–145)
Saturation Ratios: 11 % — ABNORMAL LOW (ref 20.0–50.0)
TIBC: 299.6 ug/dL (ref 250.0–450.0)
Transferrin: 214 mg/dL (ref 212.0–360.0)

## 2021-03-26 ENCOUNTER — Encounter: Payer: Self-pay | Admitting: Internal Medicine

## 2021-03-26 DIAGNOSIS — R350 Frequency of micturition: Secondary | ICD-10-CM | POA: Insufficient documentation

## 2021-03-26 LAB — URINE CULTURE
MICRO NUMBER:: 12499189
SPECIMEN QUALITY:: ADEQUATE

## 2021-03-26 NOTE — Assessment & Plan Note (Signed)
Just attempted colonoscopy.  Increased gas since.  Eating.  F/u with GI.

## 2021-03-26 NOTE — Assessment & Plan Note (Signed)
Blood pressure doing well.  Continue amlodipine.  Follow pressures.  Follow metabolic panel.

## 2021-03-26 NOTE — Assessment & Plan Note (Signed)
Continue crestor.  Low cholesterol diet and exercise. Follow lipid panel and liver function tests.   

## 2021-03-26 NOTE — Assessment & Plan Note (Signed)
Continue to avoid antiinflammatories.  Stay hydrated.   Follow metabolic panel.

## 2021-03-26 NOTE — Assessment & Plan Note (Signed)
Low carb diet and exercise.  Follow met b and a1c.  

## 2021-03-26 NOTE — Assessment & Plan Note (Signed)
Urine dip with moderate blood and small leukocytes.  Given symptoms and urine dip findings, treat with bactrim as directed.  Has taken and tolerated.  Documented celebrex allergy - is GI bleeding.  No true allergy.  Use bactrim.  Await culture results.

## 2021-03-26 NOTE — Assessment & Plan Note (Signed)
Continue crestor 

## 2021-03-26 NOTE — Assessment & Plan Note (Signed)
Continue singulair and advair.  Breathing stable.  Follow.

## 2021-03-26 NOTE — Assessment & Plan Note (Signed)
Continues on sinemet.  Followed by neurology.

## 2021-03-26 NOTE — Assessment & Plan Note (Addendum)
Controlled. On prilosec.  Request refill.

## 2021-03-26 NOTE — Assessment & Plan Note (Signed)
Has been evaluated by [facility].  Continue risk factor modificaiton.

## 2021-03-26 NOTE — Assessment & Plan Note (Signed)
Recheck cbc and iron studies.  

## 2021-03-26 NOTE — Assessment & Plan Note (Signed)
Evaluated by [facility].  Continue risk factor modification.

## 2021-03-26 NOTE — Assessment & Plan Note (Signed)
Follow liver function tests.

## 2021-03-28 ENCOUNTER — Telehealth: Payer: Self-pay | Admitting: Internal Medicine

## 2021-03-28 ENCOUNTER — Other Ambulatory Visit: Payer: Self-pay | Admitting: Internal Medicine

## 2021-03-28 DIAGNOSIS — R0609 Other forms of dyspnea: Secondary | ICD-10-CM | POA: Diagnosis not present

## 2021-03-28 DIAGNOSIS — D649 Anemia, unspecified: Secondary | ICD-10-CM

## 2021-03-28 NOTE — Telephone Encounter (Signed)
Patient called and wanted lab results.

## 2021-03-28 NOTE — Progress Notes (Signed)
Orders placed for f/u labs.  

## 2021-04-11 DIAGNOSIS — R413 Other amnesia: Secondary | ICD-10-CM | POA: Diagnosis not present

## 2021-04-11 DIAGNOSIS — G20 Parkinson's disease: Secondary | ICD-10-CM | POA: Diagnosis not present

## 2021-04-11 DIAGNOSIS — G4752 REM sleep behavior disorder: Secondary | ICD-10-CM | POA: Diagnosis not present

## 2021-04-18 ENCOUNTER — Other Ambulatory Visit: Payer: Self-pay | Admitting: Internal Medicine

## 2021-04-19 NOTE — Telephone Encounter (Signed)
Ok'd xanax #30 with no refills.

## 2021-04-19 NOTE — Telephone Encounter (Signed)
RX Refill: xanax Last Seen: 03-23-21 Last Ordered: 01-31-21 Next Appt: 07-04-21

## 2021-05-01 ENCOUNTER — Other Ambulatory Visit: Payer: Self-pay

## 2021-05-01 ENCOUNTER — Other Ambulatory Visit (INDEPENDENT_AMBULATORY_CARE_PROVIDER_SITE_OTHER): Payer: Medicare Other

## 2021-05-01 DIAGNOSIS — D649 Anemia, unspecified: Secondary | ICD-10-CM | POA: Diagnosis not present

## 2021-05-01 LAB — CBC WITH DIFFERENTIAL/PLATELET
Basophils Absolute: 0.1 10*3/uL (ref 0.0–0.1)
Basophils Relative: 0.7 % (ref 0.0–3.0)
Eosinophils Absolute: 0.1 10*3/uL (ref 0.0–0.7)
Eosinophils Relative: 0.9 % (ref 0.0–5.0)
HCT: 35.2 % — ABNORMAL LOW (ref 36.0–46.0)
Hemoglobin: 11.4 g/dL — ABNORMAL LOW (ref 12.0–15.0)
Lymphocytes Relative: 19.8 % (ref 12.0–46.0)
Lymphs Abs: 1.7 10*3/uL (ref 0.7–4.0)
MCHC: 32.5 g/dL (ref 30.0–36.0)
MCV: 88.6 fl (ref 78.0–100.0)
Monocytes Absolute: 0.5 10*3/uL (ref 0.1–1.0)
Monocytes Relative: 6.5 % (ref 3.0–12.0)
Neutro Abs: 6.1 10*3/uL (ref 1.4–7.7)
Neutrophils Relative %: 72.1 % (ref 43.0–77.0)
Platelets: 239 10*3/uL (ref 150.0–400.0)
RBC: 3.97 Mil/uL (ref 3.87–5.11)
RDW: 14.7 % (ref 11.5–15.5)
WBC: 8.5 10*3/uL (ref 4.0–10.5)

## 2021-05-01 LAB — IBC + FERRITIN
Ferritin: 29.4 ng/mL (ref 10.0–291.0)
Iron: 73 ug/dL (ref 42–145)
Saturation Ratios: 24 % (ref 20.0–50.0)
TIBC: 303.8 ug/dL (ref 250.0–450.0)
Transferrin: 217 mg/dL (ref 212.0–360.0)

## 2021-05-09 ENCOUNTER — Other Ambulatory Visit: Payer: Self-pay | Admitting: Interventional Cardiology

## 2021-05-09 ENCOUNTER — Other Ambulatory Visit: Payer: Self-pay | Admitting: Internal Medicine

## 2021-06-21 ENCOUNTER — Other Ambulatory Visit: Payer: Self-pay | Admitting: Internal Medicine

## 2021-06-27 IMAGING — CT CT HEAD W/O CM
3 of 4 series · 15 of 47 positions shown, 18 images · non-contrast
Comparison: CT 06/22/2018

CLINICAL DATA: Headache, hypertension

EXAM:
CT HEAD WITHOUT CONTRAST
TECHNIQUE: Contiguous axial images were obtained from the base of the skull
through the vertex without intravenous contrast.

[Series 2: head wo · axial · 0.42mm/px · z∈[-101,+19]mm · 9 of 29 slices shown, 12 images]
[im 3/29  brain]
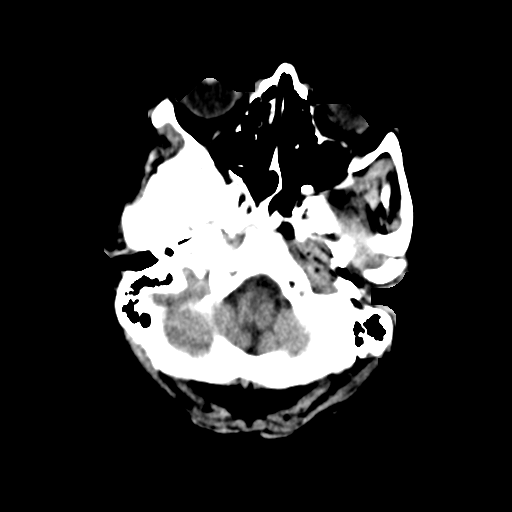
[im 3/29  bone]
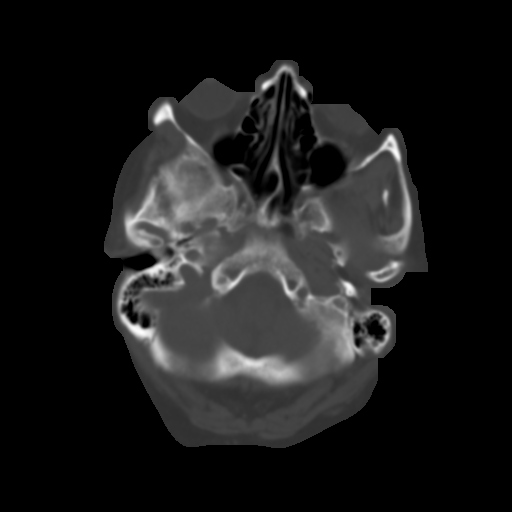
[im 7/29  brain]
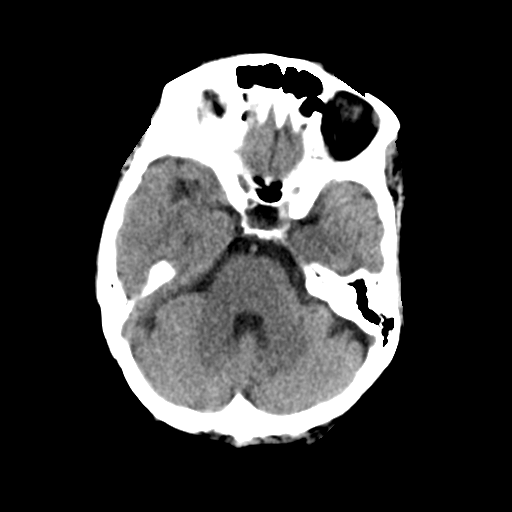
[im 9/29  brain]
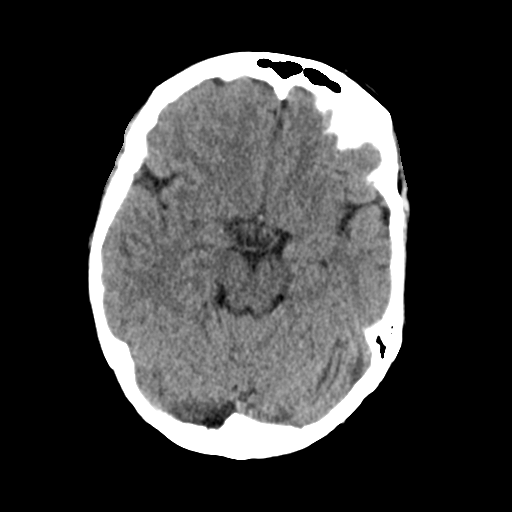
[im 13/29  brain]
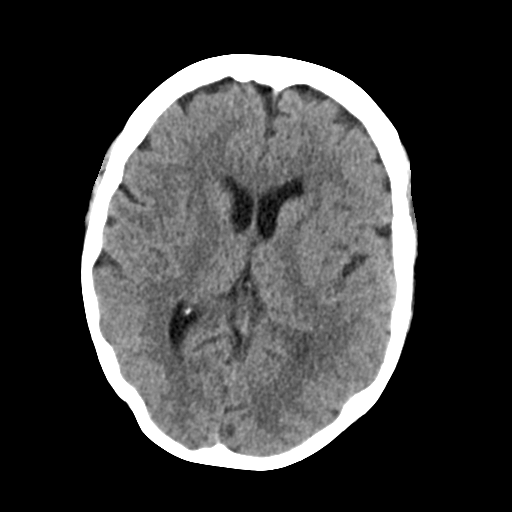
[im 15/29  brain]
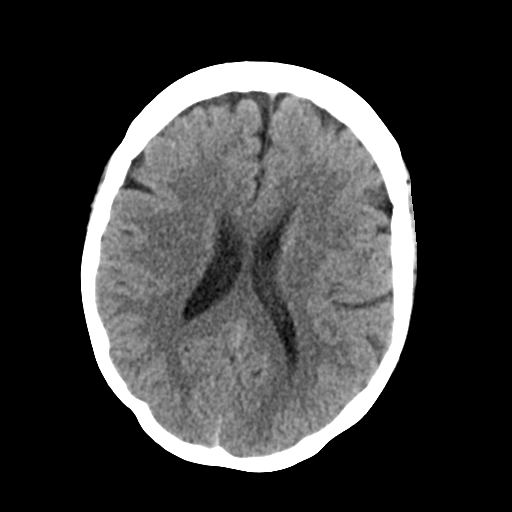
[im 15/29  bone]
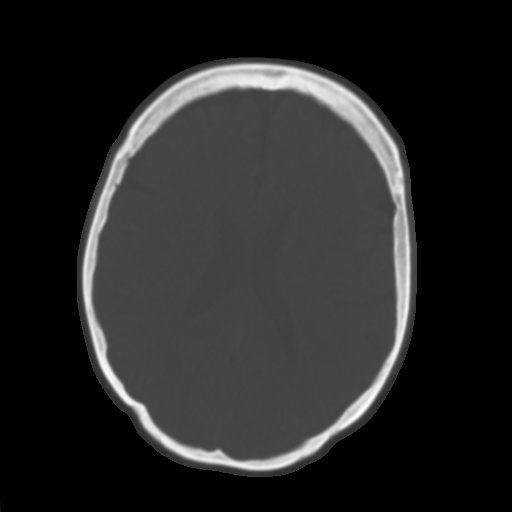
[im 17/29  brain]
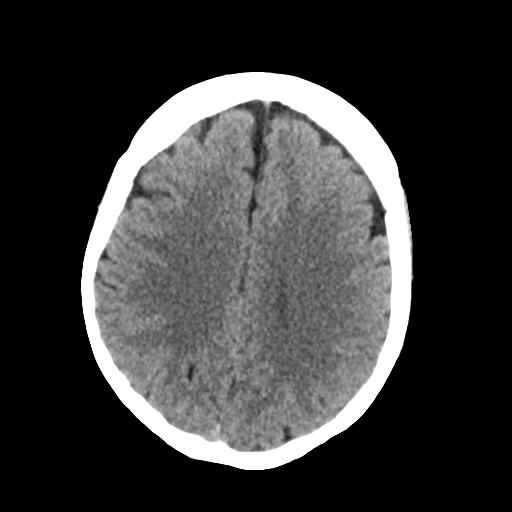
[im 21/29  brain]
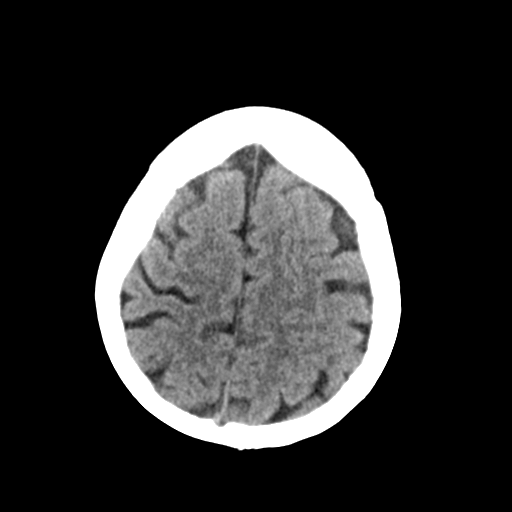
[im 23/29  brain]
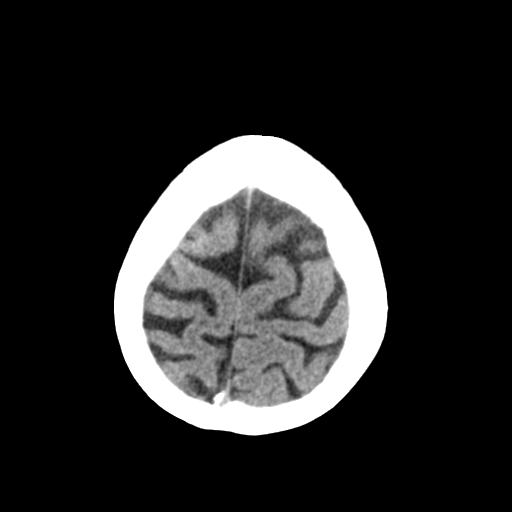
[im 27/29  brain]
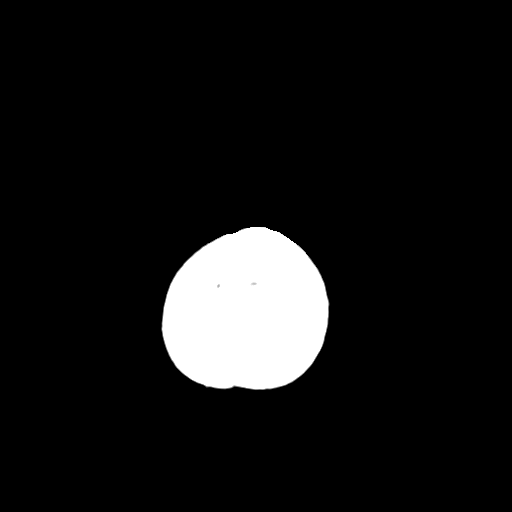
[im 27/29  bone]
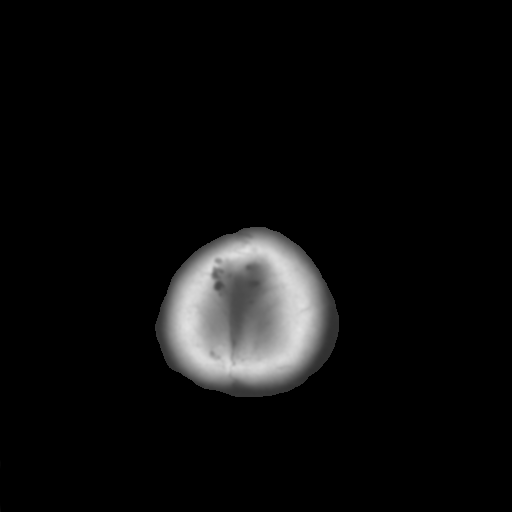

[Series 4: coronal soft tissue · coronal · 0.30mm/px · 3 of 59 slices shown]
[im 20/59  brain]
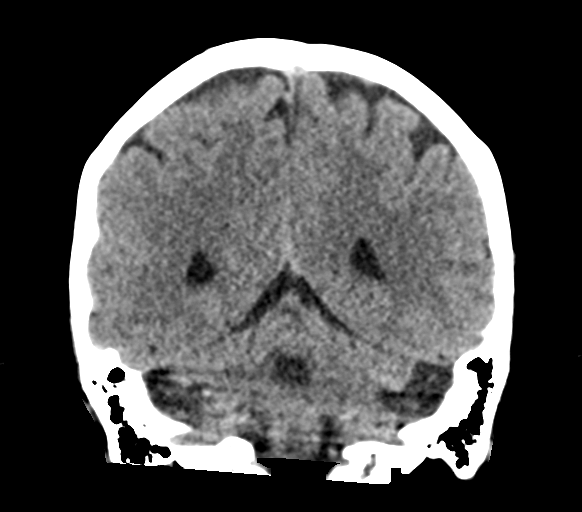
[im 26/59  brain]
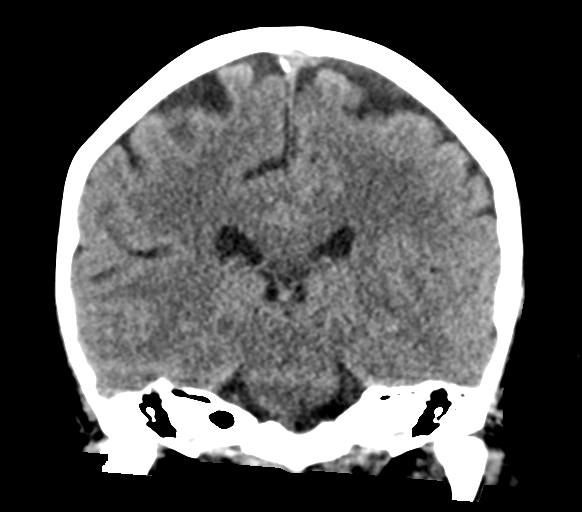
[im 33/59  brain]
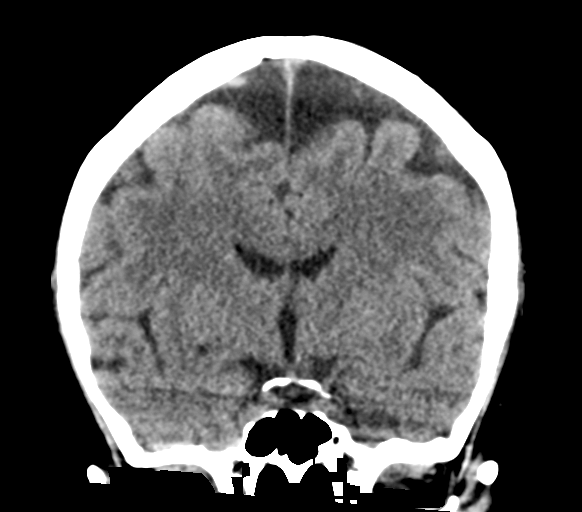

[Series 5: sagittal soft tissue · sagittal · 0.30mm/px · 3 of 49 slices shown]
[im 17/49  brain]
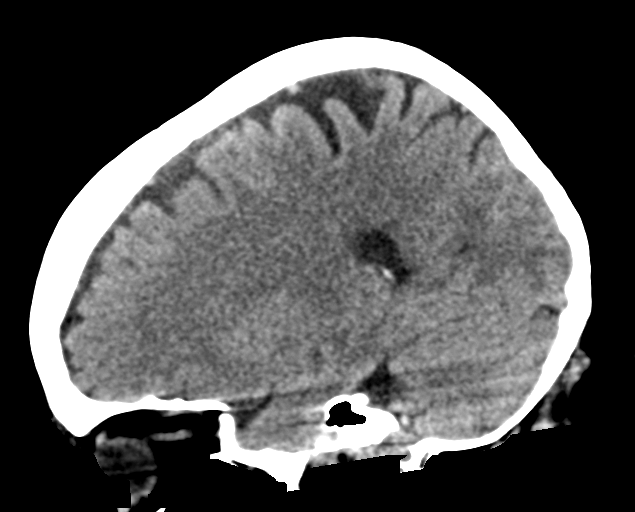
[im 25/49  brain]
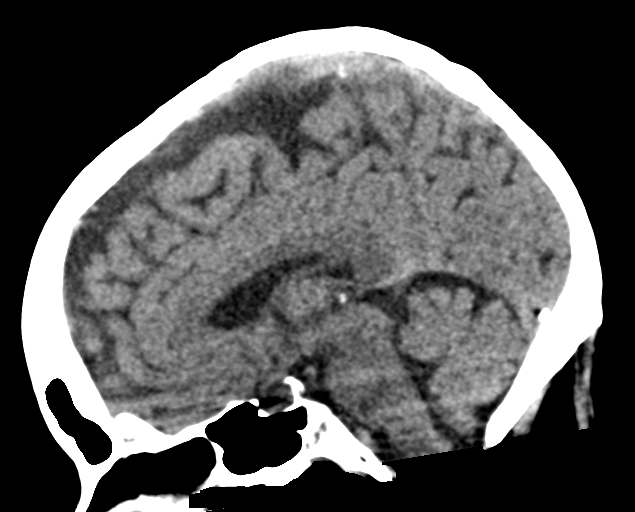
[im 33/49  brain]
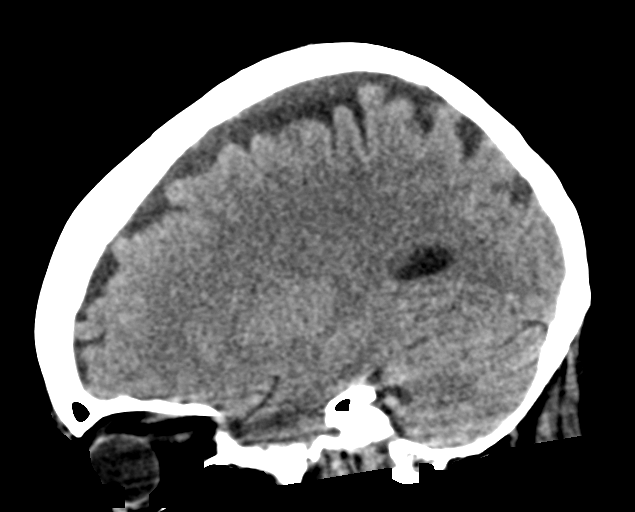

[15 of 47 positions shown; findings below may reference images not displayed]

FINDINGS: Brain: No evidence of acute infarction, hemorrhage, hydrocephalus,
extra-axial collection or mass lesion/mass effect. Symmetric
prominence of the ventricles, cisterns and sulci compatible with
frontal predominant parenchymal volume loss. Patchy areas of white
matter hypoattenuation are most compatible with chronic
microvascular angiopathy. Stable dural calcifications.

Vascular: Atherosclerotic calcification of the carotid siphons. No
hyperdense vessel.

Skull: No calvarial fracture or suspicious osseous lesion. No scalp
swelling or hematoma.

Sinuses/Orbits: Paranasal sinuses and mastoid air cells are
predominantly clear. Included orbital structures are unremarkable.

Other: None
IMPRESSION: 1. No acute intracranial abnormality.
2. Stable frontal predominant parenchymal volume loss and chronic
microvascular angiopathy.

## 2021-06-27 IMAGING — CR DG CHEST 2V
2 series · 2 of 2 positions shown · non-contrast
Comparison: Radiograph 02/13/2019, CT 11/27/2018

CLINICAL DATA: Chest pain and hypertension

EXAM:
CHEST - 2 VIEW

[chest lat]
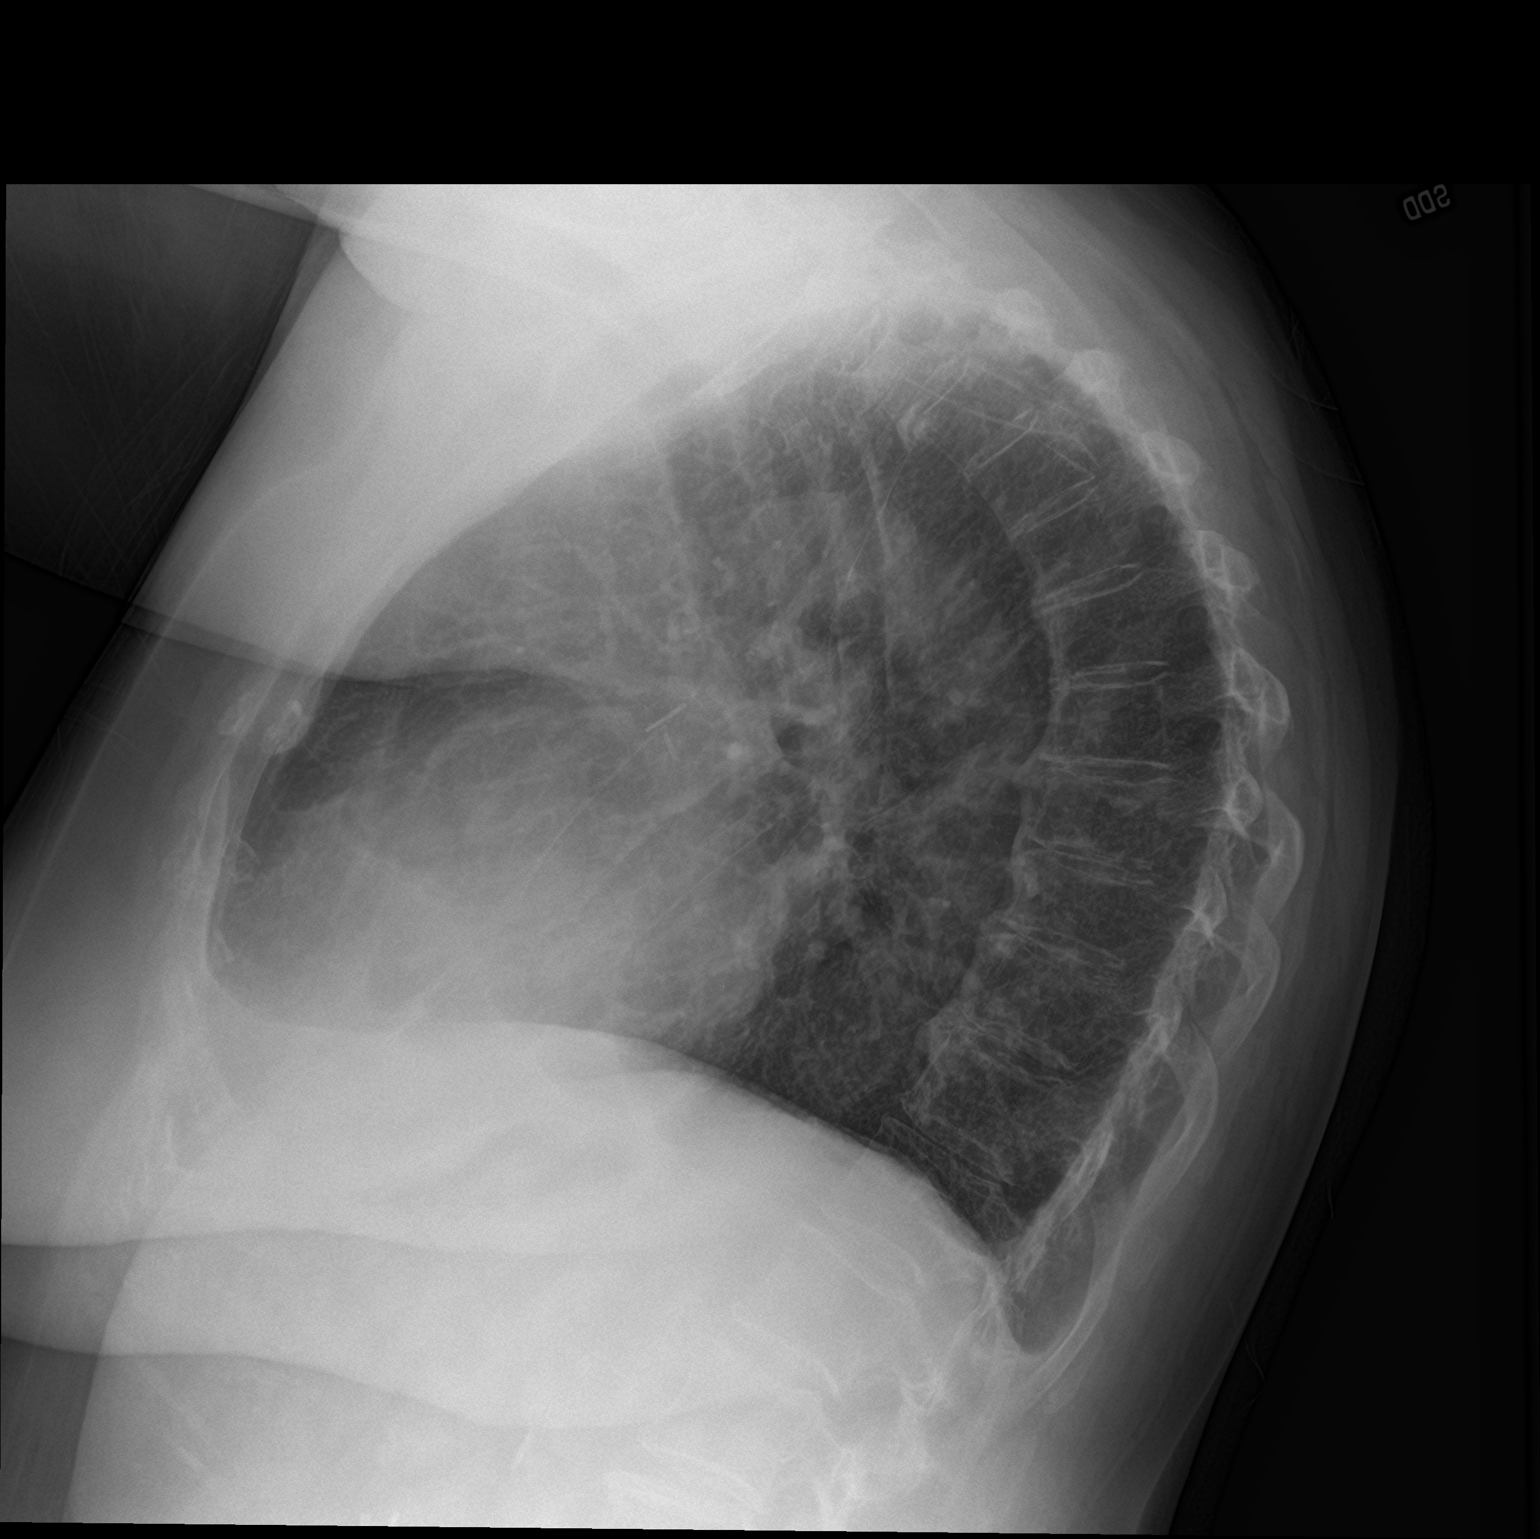

[chest pa]
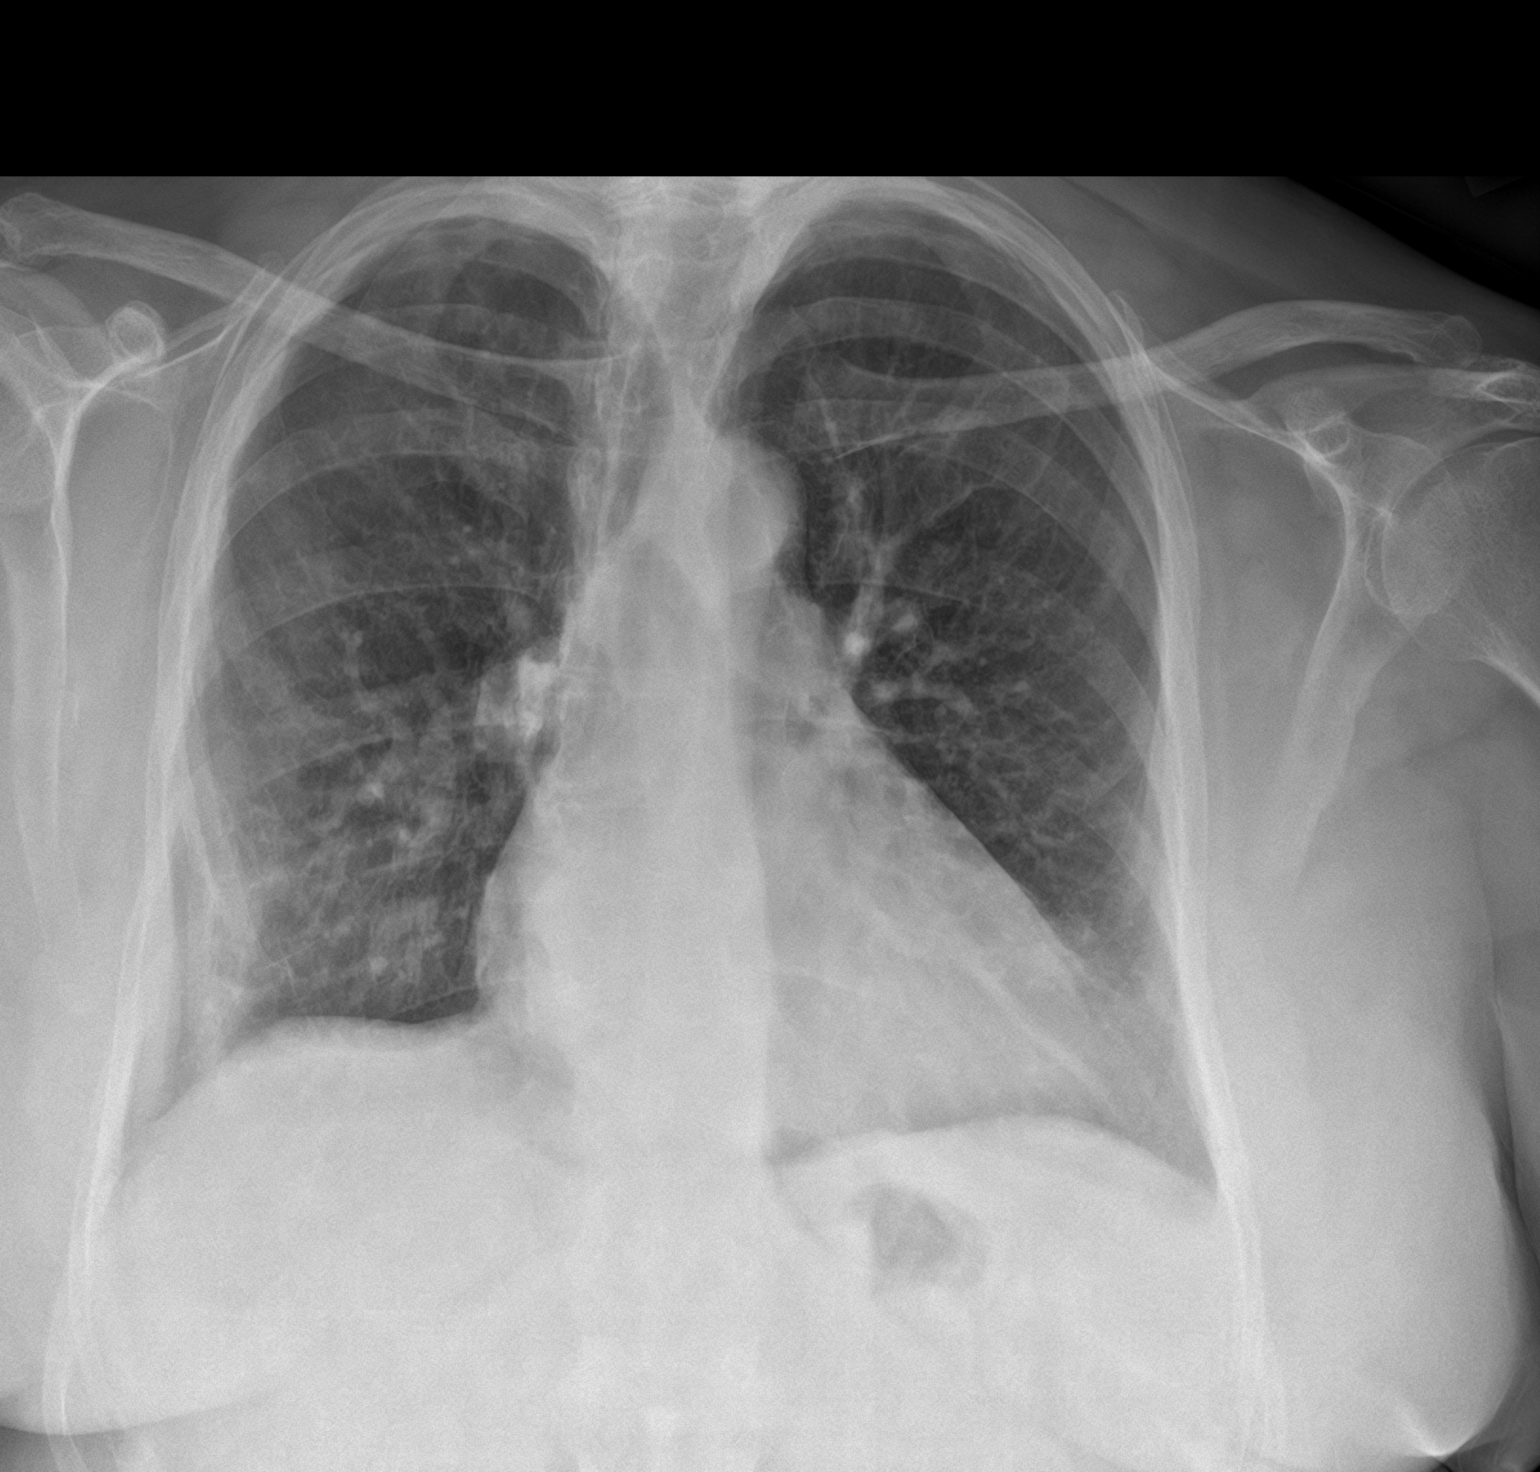

[2 of 2 positions shown; findings below may reference images not displayed]

FINDINGS: Redemonstration of several remote right rib deformities with
associated chronic pleural thickening similar to comparison
radiographs and CT. Mild hyperinflation of the lungs with increased
AP diameter to chest and flattening of the diaphragms. No focal
consolidative opacity. No pneumothorax. Blunting of the costophrenic
sulci similar to priors likely reflecting scarring. No visible
effusion. Cardiomediastinal contours are stable from prior. No acute
osseous or soft tissue abnormality.
IMPRESSION: 1. No acute cardiopulmonary disease.
2. Remote right rib deformities with associated chronic pleural
thickening.
3. Stable hyperinflation, compatible with history of COPD.

## 2021-06-30 ENCOUNTER — Other Ambulatory Visit: Payer: Self-pay | Admitting: Internal Medicine

## 2021-07-01 NOTE — Telephone Encounter (Signed)
Rx sent in for alprazolam #30 with no refills.

## 2021-07-04 ENCOUNTER — Ambulatory Visit (INDEPENDENT_AMBULATORY_CARE_PROVIDER_SITE_OTHER): Payer: Medicare Other | Admitting: Internal Medicine

## 2021-07-04 ENCOUNTER — Other Ambulatory Visit: Payer: Self-pay

## 2021-07-04 VITALS — BP 138/78 | HR 66 | Temp 98.2°F | Resp 16 | Ht 64.0 in | Wt 192.6 lb

## 2021-07-04 DIAGNOSIS — D649 Anemia, unspecified: Secondary | ICD-10-CM

## 2021-07-04 DIAGNOSIS — N183 Chronic kidney disease, stage 3 unspecified: Secondary | ICD-10-CM

## 2021-07-04 DIAGNOSIS — K219 Gastro-esophageal reflux disease without esophagitis: Secondary | ICD-10-CM | POA: Diagnosis not present

## 2021-07-04 DIAGNOSIS — I773 Arterial fibromuscular dysplasia: Secondary | ICD-10-CM

## 2021-07-04 DIAGNOSIS — I10 Essential (primary) hypertension: Secondary | ICD-10-CM

## 2021-07-04 DIAGNOSIS — R739 Hyperglycemia, unspecified: Secondary | ICD-10-CM | POA: Diagnosis not present

## 2021-07-04 DIAGNOSIS — Z85038 Personal history of other malignant neoplasm of large intestine: Secondary | ICD-10-CM

## 2021-07-04 DIAGNOSIS — I779 Disorder of arteries and arterioles, unspecified: Secondary | ICD-10-CM

## 2021-07-04 DIAGNOSIS — I700 Atherosclerosis of aorta: Secondary | ICD-10-CM

## 2021-07-04 DIAGNOSIS — E7800 Pure hypercholesterolemia, unspecified: Secondary | ICD-10-CM

## 2021-07-04 DIAGNOSIS — J449 Chronic obstructive pulmonary disease, unspecified: Secondary | ICD-10-CM

## 2021-07-04 DIAGNOSIS — R350 Frequency of micturition: Secondary | ICD-10-CM | POA: Diagnosis not present

## 2021-07-04 DIAGNOSIS — G20 Parkinson's disease: Secondary | ICD-10-CM | POA: Diagnosis not present

## 2021-07-04 DIAGNOSIS — F339 Major depressive disorder, recurrent, unspecified: Secondary | ICD-10-CM

## 2021-07-04 MED ORDER — OMEPRAZOLE 20 MG PO CPDR: 20.0000 mg | DELAYED_RELEASE_CAPSULE | Freq: Every day | ORAL | 1 refills | Status: DC

## 2021-07-04 NOTE — Progress Notes (Signed)
Patient ID: Victoria Hanson, female   DOB: [birth year], 71 y.o.   MRN: [id number]   Subjective:    Patient ID: Victoria Hanson, female    DOB: 11-02-1950, 71 y.o.   MRN: [id number]  This visit occurred during the SARS-CoV-2 public health emergency.  Safety protocols were in place, including screening questions prior to the visit, additional usage of staff PPE, and extensive cleaning of exam room while observing appropriate contact time as indicated for disinfecting solutions.   Patient here for a scheduled follow up.  Marland Kitchen   HPI Here to follow up regarding her blood pressure, breathing and acid reflux.  Saw pulmonary 03/28/21 - mild to moderate obstruction.  Recommended continuing albuterol and advair.  Also singulair.  Reflux appears to be controlled.  No chest pain.  No increased cough or congestion.  Does report intermittent noticed of rumbling in her stomach.  Occasional diarrhea.  Some urgency at times.  Off crestor.  Overall appears to be handling stress.  Seeing neurology for f/u parkinsons.     Past Medical History:  Diagnosis Date   Anxiety    Asthma    Atypical chest pain    a. 2003 Cath: reportedly nl per pt [name]);  b. 04/2015 [facility] Admission, neg troponin.   Colon cancer ([place]) 1999   a. 1999: Pt had partial colectomy. Did develop lung metastasis. Had right upper lobectomy in 01/2007 then had associated chemotherapy.   COPD (chronic obstructive pulmonary disease) (HCC)    DDD (degenerative disc disease), lumbosacral    Depression    Echocardiogram abnormal    a. 02/2010 Echo: EF 55-60%, mild LAE, no regional wall motion abnormalities   Elevated transaminase level    a. ? NASH;  b. 06/2015 - nl LFTs.   GERD (gastroesophageal reflux disease)    Hematuria    Holter monitor, abnormal    a. 12/2009 Holter monitoring: NSR, rare PACs and PVCs.   Hypercholesterolemia    Hyperlipidemia    Hypertension    Leaky heart valve    a. Per pt report - no evidence of valvular abnormalities on  prior echoes.   Lesion of skin of Right breast 10/20/2017   Lung metastases ([place]) 2008   a. S/P resection (lobectomy - right)   Morbid obesity ([place])    Nephrolithiasis    Parkinson disease ([place]) 08/2016   [name]   Past Surgical History:  Procedure Laterality Date   BREAST EXCISIONAL BIOPSY Right 10/07/2009   benign   CARDIAC CATHETERIZATION  2003   negative as per pt report   CHOLECYSTECTOMY  2000   COLON SURGERY     Colon cancer, removed part of large colon   COLONOSCOPY N/A 04/25/2015   Procedure: COLONOSCOPY;  Surgeon: Manya Silvas, MD;  Location: [facility];  Service: Endoscopy;  Laterality: N/A;   COLONOSCOPY WITH PROPOFOL N/A 06/14/2017   Procedure: COLONOSCOPY WITH PROPOFOL;  Surgeon: Manya Silvas, MD;  Location: [facility] ENDOSCOPY;  Service: Endoscopy;  Laterality: N/A;   COLONOSCOPY WITH PROPOFOL N/A 11/17/2019   Procedure: COLONOSCOPY WITH PROPOFOL;  Surgeon: [name], MD;  Location: [facility] ENDOSCOPY;  Service: Gastroenterology;  Laterality: N/A;   COLONOSCOPY WITH PROPOFOL N/A 03/21/2021   Procedure: COLONOSCOPY WITH PROPOFOL;  Surgeon: Lesly Rubenstein, MD;  Location: [facility] ENDOSCOPY;  Service: Endoscopy;  Laterality: N/A;   ESOPHAGOGASTRODUODENOSCOPY (EGD) WITH PROPOFOL N/A 03/21/2021   Procedure: ESOPHAGOGASTRODUODENOSCOPY (EGD) WITH PROPOFOL;  Surgeon: Lesly Rubenstein, MD;  Location: [facility] ENDOSCOPY;  Service: Endoscopy;  Laterality:  N/A;   LOBECTOMY  01/2007   Right lung, upper lobe right side removed   TOTAL ABDOMINAL HYSTERECTOMY     abnormal bleeding   TUBAL LIGATION  1980   Family History  Problem Relation Age of Onset   Coronary artery disease Mother        died @ 88 of MI.   Mental illness Mother    Diabetes Mother    Heart attack Father 40       MI   Cancer Father        lung - died in his mid-70's   Hypertension Father    Diabetes Father    Sudden death Brother    Mental illness Brother    Heart attack Brother        MI @ 67, died @  age 52.   Breast cancer Neg Hx    Social History   Socioeconomic History   Marital status: Widowed    Spouse name: Not on file   Number of children: Not on file   Years of education: Not on file   Highest education level: Not on file  Occupational History   Not on file  Tobacco Use   Smoking status: Never    Passive exposure: Yes   Smokeless tobacco: Never   Tobacco comments:    husband and son smoked in her home.  Vaping Use   Vaping Use: Never used  Substance and Sexual Activity   Alcohol use: Not Currently    Alcohol/week: 0.0 standard drinks   Drug use: No   Sexual activity: Never  Other Topics Concern   Not on file  Social History Narrative   Widowed.   Disabled (colon/lung CA), retired.   Lives in [place] with her dtr, grand-dtr, and son.   Active.   Gets regular exercise, walks.   Social Determinants of Health   Financial Resource Strain: Low Risk    Difficulty of Paying Living Expenses: Not hard at all  Food Insecurity: No Food Insecurity   Worried About Charity fundraiser in the Last Year: Never true   [place] in the Last Year: Never true  Transportation Needs: No Transportation Needs   Lack of Transportation (Medical): No   Lack of Transportation (Non-Medical): No  Physical Activity: Not on file  Stress: No Stress Concern Present   Feeling of Stress : Not at all  Social Connections: Unknown   Frequency of Communication with Friends and Family: More than three times a week   Frequency of Social Gatherings with Friends and Family: More than three times a week   Attends Religious Services: Not on file   Active Member of Clubs or Organizations: Not on file   Attends Archivist Meetings: Not on file   Marital Status: Not on file     Review of Systems  Constitutional:  Negative for appetite change and unexpected weight change.  HENT:  Negative for congestion and sinus pressure.   Respiratory:  Negative for cough and chest  tightness.        Breathing stable.   Cardiovascular:  Negative for chest pain and palpitations.       No increased swelling.   Gastrointestinal:  Positive for diarrhea. Negative for nausea and vomiting.  Genitourinary:  Negative for difficulty urinating and dysuria.  Musculoskeletal:  Negative for joint swelling and myalgias.  Skin:  Negative for color change and rash.  Neurological:  Negative for dizziness, light-headedness and headaches.  Psychiatric/Behavioral:  Negative for agitation and dysphoric mood.       Objective:     BP 138/78    Pulse 66    Temp 98.2 F (36.8 C)    Resp 16    Ht $R'5\' 4"'CB$  (1.626 m)    Wt 192 lb 9.6 oz (87.4 kg)    SpO2 98%    BMI 33.06 kg/m  Wt Readings from Last 3 Encounters:  07/04/21 192 lb 9.6 oz (87.4 kg)  03/23/21 192 lb 12.8 oz (87.5 kg)  03/21/21 190 lb (86.2 kg)    Physical Exam Vitals reviewed.  Constitutional:      General: She is not in acute distress.    Appearance: Normal appearance.  HENT:     Head: Normocephalic and atraumatic.     Right Ear: External ear normal.     Left Ear: External ear normal.  Eyes:     General: No scleral icterus.       Right eye: No discharge.        Left eye: No discharge.     Conjunctiva/sclera: Conjunctivae normal.  Neck:     Thyroid: No thyromegaly.  Cardiovascular:     Rate and Rhythm: Normal rate and regular rhythm.  Pulmonary:     Effort: No respiratory distress.     Breath sounds: Normal breath sounds. No wheezing.  Abdominal:     General: Bowel sounds are normal.     Palpations: Abdomen is soft.     Tenderness: There is no abdominal tenderness.  Musculoskeletal:        General: No swelling or tenderness.     Cervical back: Neck supple. No tenderness.  Lymphadenopathy:     Cervical: No cervical adenopathy.  Skin:    Findings: No erythema or rash.  Neurological:     Mental Status: She is alert.  Psychiatric:        Mood and Affect: Mood normal.        Behavior: Behavior normal.      Outpatient Encounter Medications as of 07/04/2021  Medication Sig   ALPRAZolam (XANAX) 0.5 MG tablet TAKE 1/2 TABLET BY MOUTH ONCE DAILY AS NEEDED   amLODipine (NORVASC) 2.5 MG tablet TAKE 1 TABLET BY MOUTH DAILY   aspirin EC 81 MG tablet Take 81 mg by mouth as needed.    calcium-vitamin D (OSCAL WITH D) 500-200 MG-UNIT tablet Take 1 tablet by mouth.   carbidopa-levodopa (SINEMET) 25-100 MG tablet Take 1.5 in am and one at 11:00 and one in afternoon and 1.5 at night.   citalopram (CELEXA) 10 MG tablet TAKE ONE AND ONE-HALF TABLET BY MOUTH DAILY   diclofenac Sodium (VOLTAREN) 1 % GEL Apply topically 4 (four) times daily.   fluticasone (FLONASE) 50 MCG/ACT nasal spray Place 2 sprays into both nostrils daily.   fluticasone-salmeterol (ADVAIR HFA) 115-21 MCG/ACT inhaler Inhale 2 puffs into the lungs 2 (two) times daily.   furosemide (LASIX) 20 MG tablet TAKE 1 TABLET BY MOUTH DAILY AS NEEDED   montelukast (SINGULAIR) 10 MG tablet TAKE 1 TABLET BY MOUTH AT BEDTIME   omeprazole (PRILOSEC) 20 MG capsule Take 1 capsule (20 mg total) by mouth daily.   PROAIR HFA 108 (90 Base) MCG/ACT inhaler INHALE 2 PUFFS BY MOUTH INTO THE LUNGS EVERY 6 HOURS AS NEEDED FOR WHEEZING   rosuvastatin (CRESTOR) 5 MG tablet TAKE 1 TABLET BY MOUTH EVERY MONDAY, WEDNESDAY AND FRIDAY   traZODone (DESYREL) 50 MG tablet TAKE 2 TABLETS BY MOUTH AT BEDTIME  vitamin B-12 (CYANOCOBALAMIN) 100 MCG tablet Take 100 mcg by mouth daily.   [DISCONTINUED] omeprazole (PRILOSEC) 20 MG capsule Take 1 capsule (20 mg total) by mouth daily.   [DISCONTINUED] sulfamethoxazole-trimethoprim (BACTRIM DS) 800-160 MG tablet Take 1 tablet by mouth 2 (two) times daily.   No facility-administered encounter medications on file as of 07/04/2021.     Lab Results  Component Value Date   WBC 8.2 07/04/2021   HGB 12.2 07/04/2021   HCT 37.6 07/04/2021   PLT 236.0 07/04/2021   GLUCOSE 87 07/06/2021   CHOL 225 (H) 07/04/2021   TRIG 127.0 07/04/2021    HDL 61.40 07/04/2021   LDLDIRECT 163.4 12/31/2012   LDLCALC 138 (H) 07/04/2021   ALT 3 07/04/2021   AST 12 07/04/2021   NA 141 07/06/2021   K 4.0 07/06/2021   CL 103 07/06/2021   CREATININE 1.08 07/06/2021   BUN 18 07/06/2021   CO2 27 07/06/2021   TSH 1.41 07/04/2021   INR 1.0 12/12/2012   HGBA1C 5.8 07/04/2021       Assessment & Plan:   Problem List Items Addressed This Visit     Anemia   Relevant Orders   CBC with Differential/Platelet (Completed)   Ferritin (Completed)   Aortic atherosclerosis ([place])    Off crestor.  Consider trial of another statin.       Carotid artery disease ([place])    Evaluated by [facility].  Continue risk factor modification.        Chronic obstructive pulmonary disease ([place])    Continue singulair and advair.  Breathing stable.  Follow.       CKD (chronic kidney disease), stage III ([place])    Continue to avoid antiinflammatories.  Stay hydrated.   Follow metabolic panel.       Depression, recurrent (HCC)    Continue citalopram.  Stable.       Fibromuscular dysplasia ([place])    Has been evaluated by [facility].  Continue risk factor modificaiton.       GERD (gastroesophageal reflux disease)    Recommend prilosec.       Relevant Medications   omeprazole (PRILOSEC) 20 MG capsule   History of colon cancer    Recent attempt at colonoscopy - poor prep.  Due to see GI tomorrow.  Discussed prilosec.  Also, fiber and probiotics to help with GI symptoms.       Hyperglycemia    Low carb diet and exercise.  Follow met b and a1c.       Relevant Orders   Hemoglobin A1c (Completed)   Hypertension    Blood pressure doing well.  Continue amlodipine.  Follow pressures.  Follow metabolic panel.       Parkinson's disease ([place])    Continues on sinemet.  Followed by neurology.       Pure hypercholesterolemia - Primary    Not taking crestor.  Low cholesterol diet and exercise.  Follow lipid panel.       Relevant Orders   Lipid panel (Completed)   TSH  (Completed)   Hepatic function panel (Completed)   Urinary frequency    Check urine to confirm no infection.       Relevant Orders   Urinalysis, Routine w reflex microscopic (Completed)   Urine Culture (Completed)     Einar Pheasant, MD

## 2021-07-04 NOTE — Patient Instructions (Signed)
Start a probiotic daily Examples of probiotics:  florastor, culturelle or align  Start benefiber daily.

## 2021-07-05 DIAGNOSIS — D509 Iron deficiency anemia, unspecified: Secondary | ICD-10-CM | POA: Diagnosis not present

## 2021-07-05 DIAGNOSIS — Z85038 Personal history of other malignant neoplasm of large intestine: Secondary | ICD-10-CM | POA: Diagnosis not present

## 2021-07-05 DIAGNOSIS — R1013 Epigastric pain: Secondary | ICD-10-CM | POA: Diagnosis not present

## 2021-07-05 LAB — CBC WITH DIFFERENTIAL/PLATELET
Basophils Absolute: 0.1 10*3/uL (ref 0.0–0.1)
Basophils Relative: 1.3 % (ref 0.0–3.0)
Eosinophils Absolute: 0.1 10*3/uL (ref 0.0–0.7)
Eosinophils Relative: 1.1 % (ref 0.0–5.0)
HCT: 37.6 % (ref 36.0–46.0)
Hemoglobin: 12.2 g/dL (ref 12.0–15.0)
Lymphocytes Relative: 25.1 % (ref 12.0–46.0)
Lymphs Abs: 2.1 10*3/uL (ref 0.7–4.0)
MCHC: 32.4 g/dL (ref 30.0–36.0)
MCV: 88 fl (ref 78.0–100.0)
Monocytes Absolute: 0.6 10*3/uL (ref 0.1–1.0)
Monocytes Relative: 7.4 % (ref 3.0–12.0)
Neutro Abs: 5.4 10*3/uL (ref 1.4–7.7)
Neutrophils Relative %: 65.1 % (ref 43.0–77.0)
Platelets: 236 10*3/uL (ref 150.0–400.0)
RBC: 4.27 Mil/uL (ref 3.87–5.11)
RDW: 13.7 % (ref 11.5–15.5)
WBC: 8.2 10*3/uL (ref 4.0–10.5)

## 2021-07-05 LAB — URINALYSIS, ROUTINE W REFLEX MICROSCOPIC
Bilirubin Urine: NEGATIVE
Hgb urine dipstick: NEGATIVE
Ketones, ur: NEGATIVE
Leukocytes,Ua: NEGATIVE
Nitrite: NEGATIVE
RBC / HPF: NONE SEEN (ref 0–?)
Specific Gravity, Urine: <=1.005 — AB (ref 1.000–1.030)
Total Protein, Urine: NEGATIVE
Urine Glucose: NEGATIVE
Urobilinogen, UA: 0.2 (ref 0.0–1.0)
WBC, UA: NONE SEEN (ref 0–?)
pH: 5.5 (ref 5.0–8.0)

## 2021-07-05 LAB — LIPID PANEL
Cholesterol: 225 mg/dL — ABNORMAL HIGH (ref 0–200)
HDL: 61.4 mg/dL (ref >39.00)
LDL Cholesterol: 138 mg/dL — ABNORMAL HIGH (ref 0–99)
NonHDL: 163.75
Total CHOL/HDL Ratio: 4
Triglycerides: 127 mg/dL (ref 0.0–149.0)
VLDL: 25.4 mg/dL (ref 0.0–40.0)

## 2021-07-05 LAB — HEMOGLOBIN A1C: Hgb A1c MFr Bld: 5.8 % (ref 4.6–6.5)

## 2021-07-05 LAB — URINE CULTURE
MICRO NUMBER:: 12911953
Result:: NO GROWTH
SPECIMEN QUALITY:: ADEQUATE

## 2021-07-05 LAB — HEPATIC FUNCTION PANEL
ALT: 3 U/L (ref 0–35)
AST: 12 U/L (ref 0–37)
Albumin: 4.1 g/dL (ref 3.5–5.2)
Alkaline Phosphatase: 86 U/L (ref 39–117)
Bilirubin, Direct: 0.1 mg/dL (ref 0.0–0.3)
Total Bilirubin: 0.3 mg/dL (ref 0.2–1.2)
Total Protein: 6.8 g/dL (ref 6.0–8.3)

## 2021-07-05 LAB — TSH: TSH: 1.41 u[IU]/mL (ref 0.35–5.50)

## 2021-07-05 LAB — FERRITIN: Ferritin: 32.6 ng/mL (ref 10.0–291.0)

## 2021-07-06 ENCOUNTER — Other Ambulatory Visit: Payer: Self-pay | Admitting: Internal Medicine

## 2021-07-06 ENCOUNTER — Other Ambulatory Visit (INDEPENDENT_AMBULATORY_CARE_PROVIDER_SITE_OTHER): Payer: Medicare Other

## 2021-07-06 DIAGNOSIS — N183 Chronic kidney disease, stage 3 unspecified: Secondary | ICD-10-CM

## 2021-07-06 LAB — BASIC METABOLIC PANEL
BUN: 18 mg/dL (ref 6–23)
CO2: 27 mEq/L (ref 19–32)
Calcium: 9.7 mg/dL (ref 8.4–10.5)
Chloride: 103 mEq/L (ref 96–112)
Creatinine, Ser: 1.08 mg/dL (ref 0.40–1.20)
GFR: 51.63 mL/min — ABNORMAL LOW (ref >60.00)
Glucose, Bld: 87 mg/dL (ref 70–99)
Potassium: 4 mEq/L (ref 3.5–5.1)
Sodium: 141 mEq/L (ref 135–145)

## 2021-07-06 IMAGING — DX DG ABDOMEN 1V
2 series · 2 of 2 positions shown · non-contrast
Comparison: CT abdomen/pelvis 01/30/2019.

CLINICAL DATA: Abdominal pain and constipation.

EXAM:
ABDOMEN - 1 VIEW

[abdomen standing ap (1 of 2)]
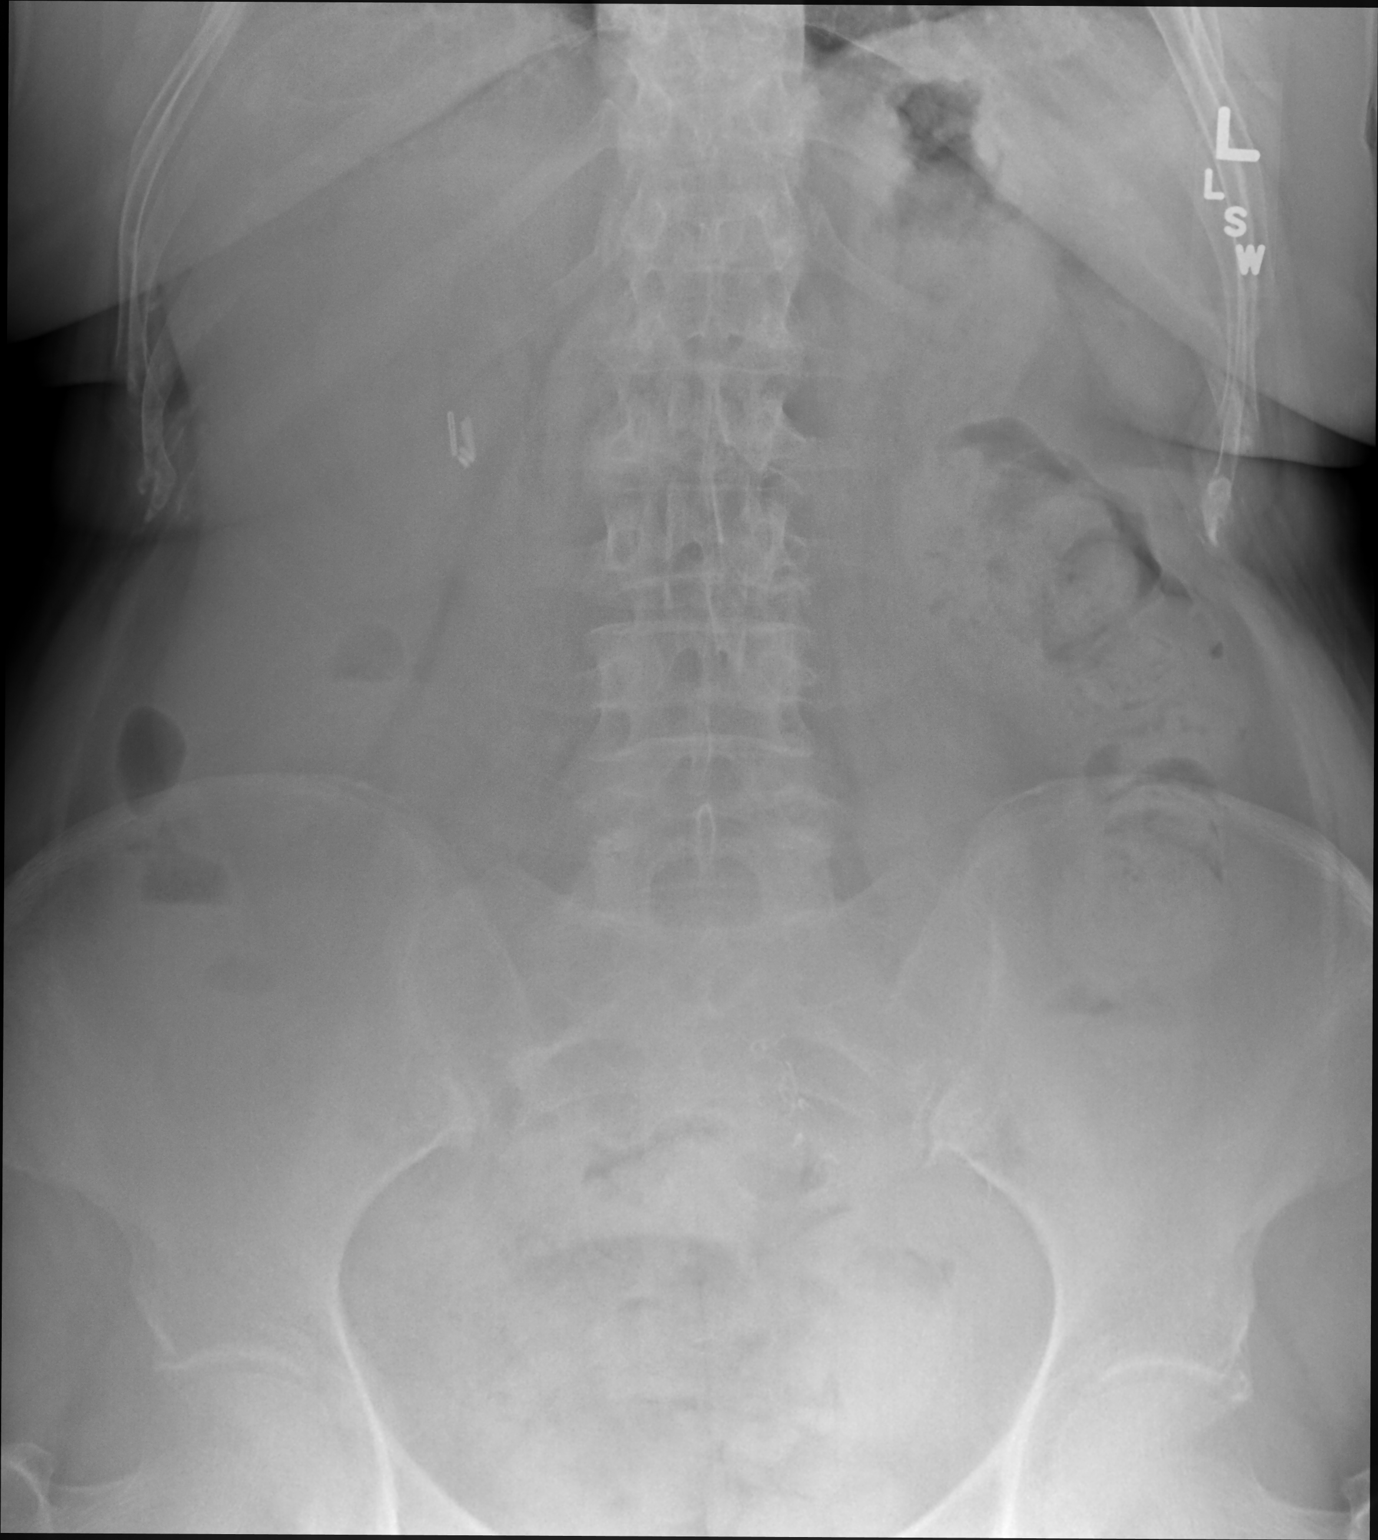

[abdomen standing ap (2 of 2)]
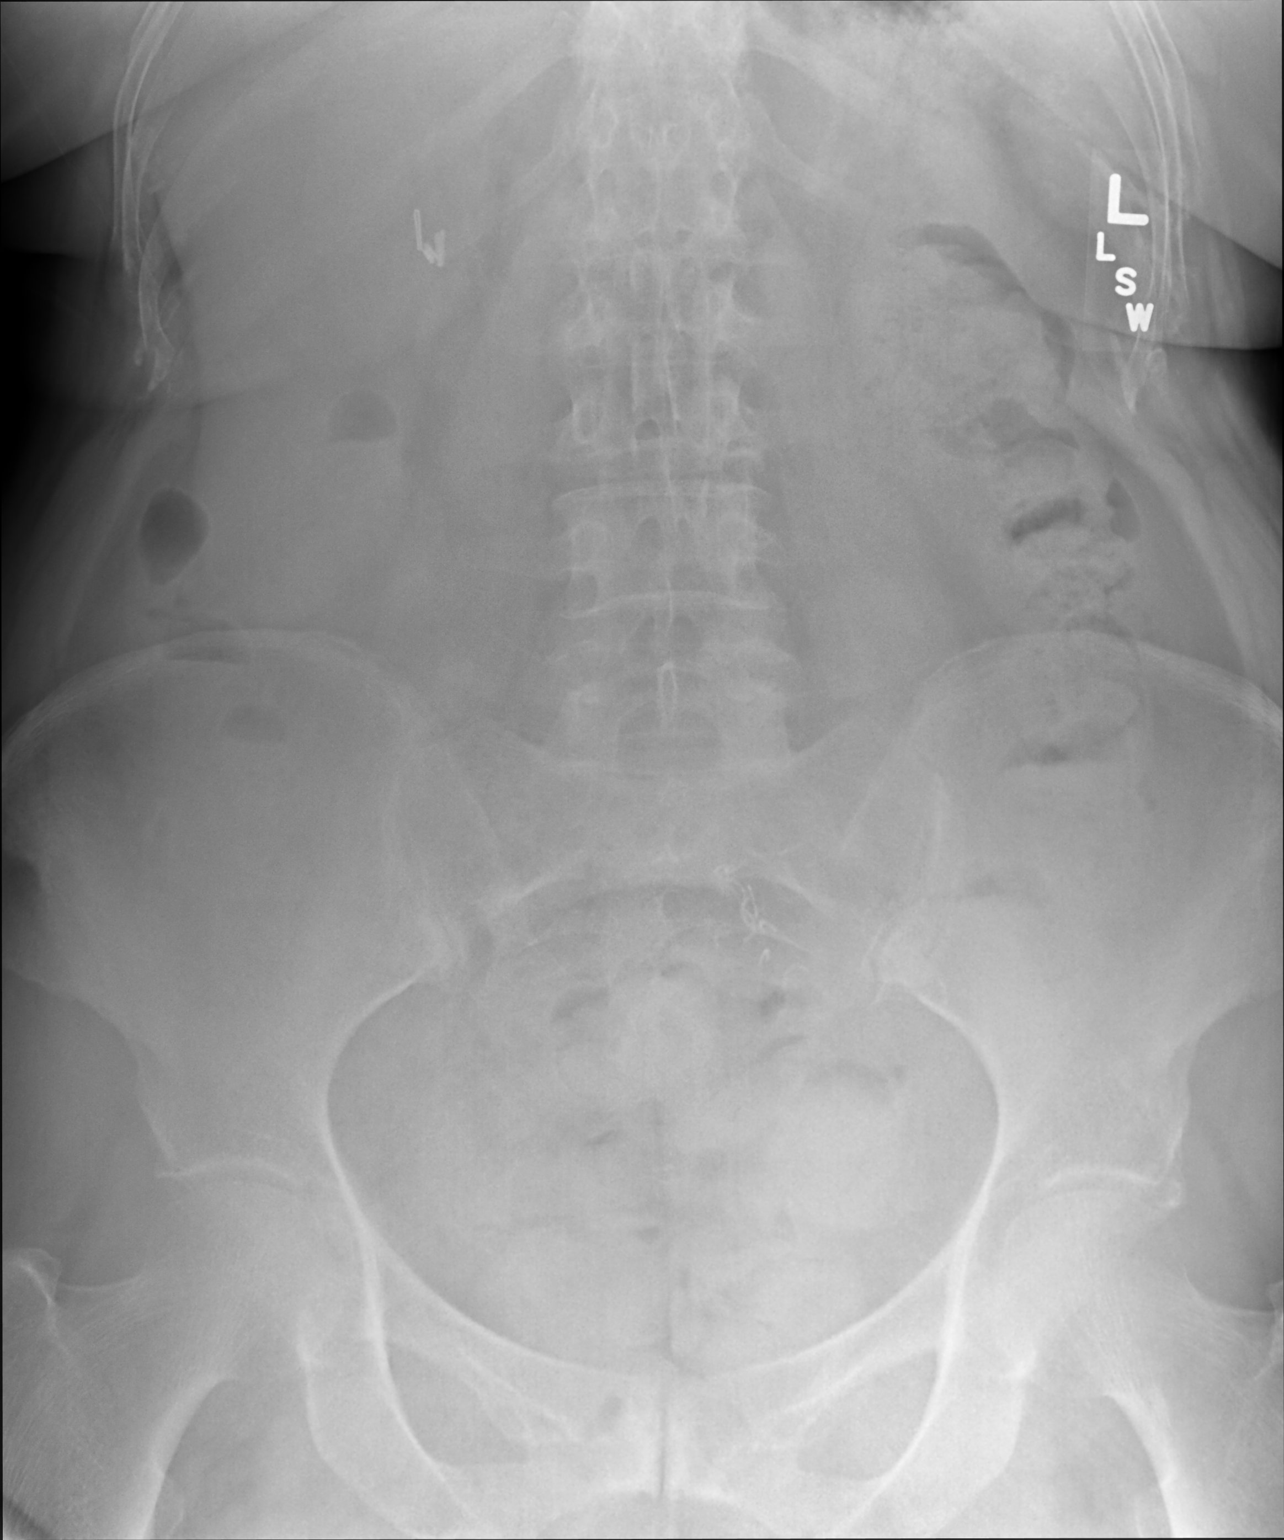

[2 of 2 positions shown; findings below may reference images not displayed]

FINDINGS: No dilated loops of bowel are demonstrated to suggest small bowel
obstruction. There is a large stool burden within the left colon and
rectum. Surgical clips within the right upper quadrant. No acute
bony abnormality identified.
IMPRESSION: Large stool burden within the left colon and rectum consistent with
the provided history of constipation.

## 2021-07-06 NOTE — Progress Notes (Signed)
Order placed for add on lab.

## 2021-07-09 ENCOUNTER — Encounter: Payer: Self-pay | Admitting: Internal Medicine

## 2021-07-09 NOTE — Assessment & Plan Note (Signed)
Continue citalopram.  Stable.

## 2021-07-09 NOTE — Assessment & Plan Note (Signed)
Off crestor.  Consider trial of another statin.

## 2021-07-09 NOTE — Assessment & Plan Note (Addendum)
Recommend prilosec.

## 2021-07-09 NOTE — Assessment & Plan Note (Signed)
Evaluated by [facility].  Continue risk factor modification.

## 2021-07-09 NOTE — Assessment & Plan Note (Signed)
Continue to avoid antiinflammatories.  Stay hydrated.   Follow metabolic panel.

## 2021-07-09 NOTE — Assessment & Plan Note (Signed)
Has been evaluated by [facility].  Continue risk factor modificaiton.

## 2021-07-09 NOTE — Assessment & Plan Note (Signed)
Low carb diet and exercise.  Follow met b and a1c.

## 2021-07-09 NOTE — Assessment & Plan Note (Signed)
Recent attempt at colonoscopy - poor prep.  Due to see GI tomorrow.  Discussed prilosec.  Also, fiber and probiotics to help with GI symptoms.

## 2021-07-09 NOTE — Assessment & Plan Note (Signed)
Continue singulair and advair.  Breathing stable.  Follow.

## 2021-07-09 NOTE — Assessment & Plan Note (Signed)
Continues on sinemet.  Followed by neurology.

## 2021-07-09 NOTE — Assessment & Plan Note (Signed)
Blood pressure doing well.  Continue amlodipine.  Follow pressures.  Follow metabolic panel.

## 2021-07-09 NOTE — Assessment & Plan Note (Signed)
Check urine to confirm no infection.  

## 2021-07-09 NOTE — Assessment & Plan Note (Signed)
Not taking crestor.  Low cholesterol diet and exercise.  Follow lipid panel.

## 2021-07-11 ENCOUNTER — Telehealth: Payer: Self-pay

## 2021-07-11 NOTE — Telephone Encounter (Signed)
LMTCB

## 2021-07-11 NOTE — Telephone Encounter (Signed)
Patient called in stated returning a call. Please call patient @ [phone]

## 2021-07-11 NOTE — Telephone Encounter (Signed)
-----   Message from Einar Pheasant, MD sent at 07/06/2021  5:01 AM EST ----- Notify Victoria Hanson that her cholesterol has increased when compared to the previous check.  She is off crestor.  She was concerned regarding her liver.  Liver function tests wnl.  I would like to restart crestor.  We can follow liver tests.  If starts, can recheck liver panel in 4 weeks.  Overall sugar control improved.  Hgb and thyroid test wnl.

## 2021-07-13 ENCOUNTER — Other Ambulatory Visit: Payer: Self-pay

## 2021-07-13 DIAGNOSIS — E7800 Pure hypercholesterolemia, unspecified: Secondary | ICD-10-CM

## 2021-07-13 NOTE — Telephone Encounter (Signed)
Patient aware of results.

## 2021-08-04 ENCOUNTER — Other Ambulatory Visit: Payer: Self-pay | Admitting: Internal Medicine

## 2021-08-09 DIAGNOSIS — M170 Bilateral primary osteoarthritis of knee: Secondary | ICD-10-CM | POA: Diagnosis not present

## 2021-08-14 ENCOUNTER — Other Ambulatory Visit: Payer: Self-pay

## 2021-08-14 ENCOUNTER — Other Ambulatory Visit (INDEPENDENT_AMBULATORY_CARE_PROVIDER_SITE_OTHER): Payer: Medicare Other

## 2021-08-14 DIAGNOSIS — E7800 Pure hypercholesterolemia, unspecified: Secondary | ICD-10-CM

## 2021-08-14 LAB — HEPATIC FUNCTION PANEL
ALT: 3 U/L (ref 0–35)
AST: 12 U/L (ref 0–37)
Albumin: 3.8 g/dL (ref 3.5–5.2)
Alkaline Phosphatase: 71 U/L (ref 39–117)
Bilirubin, Direct: 0.1 mg/dL (ref 0.0–0.3)
Total Bilirubin: 0.4 mg/dL (ref 0.2–1.2)
Total Protein: 6.2 g/dL (ref 6.0–8.3)

## 2021-08-16 DIAGNOSIS — Z01818 Encounter for other preprocedural examination: Secondary | ICD-10-CM | POA: Diagnosis not present

## 2021-08-18 DIAGNOSIS — R252 Cramp and spasm: Secondary | ICD-10-CM | POA: Diagnosis not present

## 2021-08-18 DIAGNOSIS — R413 Other amnesia: Secondary | ICD-10-CM | POA: Diagnosis not present

## 2021-08-18 DIAGNOSIS — G20 Parkinson's disease: Secondary | ICD-10-CM | POA: Diagnosis not present

## 2021-08-18 DIAGNOSIS — G4752 REM sleep behavior disorder: Secondary | ICD-10-CM | POA: Diagnosis not present

## 2021-08-24 DIAGNOSIS — G20 Parkinson's disease: Secondary | ICD-10-CM | POA: Diagnosis not present

## 2021-08-24 DIAGNOSIS — G8918 Other acute postprocedural pain: Secondary | ICD-10-CM | POA: Diagnosis not present

## 2021-08-24 DIAGNOSIS — J449 Chronic obstructive pulmonary disease, unspecified: Secondary | ICD-10-CM | POA: Diagnosis not present

## 2021-08-24 DIAGNOSIS — K449 Diaphragmatic hernia without obstruction or gangrene: Secondary | ICD-10-CM | POA: Diagnosis not present

## 2021-08-24 DIAGNOSIS — I10 Essential (primary) hypertension: Secondary | ICD-10-CM | POA: Diagnosis not present

## 2021-08-24 DIAGNOSIS — Z85118 Personal history of other malignant neoplasm of bronchus and lung: Secondary | ICD-10-CM | POA: Diagnosis not present

## 2021-08-24 DIAGNOSIS — E785 Hyperlipidemia, unspecified: Secondary | ICD-10-CM | POA: Diagnosis not present

## 2021-08-24 DIAGNOSIS — M1712 Unilateral primary osteoarthritis, left knee: Secondary | ICD-10-CM | POA: Diagnosis not present

## 2021-08-24 DIAGNOSIS — M25562 Pain in left knee: Secondary | ICD-10-CM | POA: Diagnosis not present

## 2021-08-24 DIAGNOSIS — F32A Depression, unspecified: Secondary | ICD-10-CM | POA: Diagnosis not present

## 2021-08-24 DIAGNOSIS — Z85038 Personal history of other malignant neoplasm of large intestine: Secondary | ICD-10-CM | POA: Diagnosis not present

## 2021-08-25 DIAGNOSIS — E785 Hyperlipidemia, unspecified: Secondary | ICD-10-CM | POA: Diagnosis not present

## 2021-08-25 DIAGNOSIS — Z85118 Personal history of other malignant neoplasm of bronchus and lung: Secondary | ICD-10-CM | POA: Diagnosis not present

## 2021-08-25 DIAGNOSIS — F32A Depression, unspecified: Secondary | ICD-10-CM | POA: Diagnosis not present

## 2021-08-25 DIAGNOSIS — M1712 Unilateral primary osteoarthritis, left knee: Secondary | ICD-10-CM | POA: Diagnosis not present

## 2021-08-25 DIAGNOSIS — K449 Diaphragmatic hernia without obstruction or gangrene: Secondary | ICD-10-CM | POA: Diagnosis not present

## 2021-08-25 DIAGNOSIS — I10 Essential (primary) hypertension: Secondary | ICD-10-CM | POA: Diagnosis not present

## 2021-08-25 DIAGNOSIS — G20 Parkinson's disease: Secondary | ICD-10-CM | POA: Diagnosis not present

## 2021-08-25 DIAGNOSIS — J449 Chronic obstructive pulmonary disease, unspecified: Secondary | ICD-10-CM | POA: Diagnosis not present

## 2021-08-25 DIAGNOSIS — Z85038 Personal history of other malignant neoplasm of large intestine: Secondary | ICD-10-CM | POA: Diagnosis not present

## 2021-08-30 ENCOUNTER — Other Ambulatory Visit: Payer: Self-pay

## 2021-08-30 ENCOUNTER — Other Ambulatory Visit: Payer: Self-pay | Admitting: Orthopedic Surgery

## 2021-08-30 ENCOUNTER — Ambulatory Visit
Admission: RE | Admit: 2021-08-30 | Discharge: 2021-08-30 | Disposition: A | Discharge status: Home / Self Care | Payer: Medicare Other | Source: Ambulatory Visit | Attending: Orthopedic Surgery | Admitting: Orthopedic Surgery

## 2021-08-30 DIAGNOSIS — R2242 Localized swelling, mass and lump, left lower limb: Secondary | ICD-10-CM

## 2021-08-30 DIAGNOSIS — M79605 Pain in left leg: Secondary | ICD-10-CM | POA: Diagnosis not present

## 2021-08-30 DIAGNOSIS — R600 Localized edema: Secondary | ICD-10-CM | POA: Diagnosis not present

## 2021-08-31 DIAGNOSIS — Z96652 Presence of left artificial knee joint: Secondary | ICD-10-CM | POA: Diagnosis not present

## 2021-09-05 DIAGNOSIS — M25562 Pain in left knee: Secondary | ICD-10-CM | POA: Insufficient documentation

## 2021-09-05 DIAGNOSIS — M25662 Stiffness of left knee, not elsewhere classified: Secondary | ICD-10-CM | POA: Diagnosis not present

## 2021-09-08 DIAGNOSIS — M25562 Pain in left knee: Secondary | ICD-10-CM | POA: Diagnosis not present

## 2021-09-08 DIAGNOSIS — M25662 Stiffness of left knee, not elsewhere classified: Secondary | ICD-10-CM | POA: Diagnosis not present

## 2021-09-11 ENCOUNTER — Ambulatory Visit
Admission: RE | Admit: 2021-09-11 | Discharge: 2021-09-11 | Disposition: A | Discharge status: Home / Self Care | Payer: Medicare Other | Source: Ambulatory Visit | Attending: Family | Admitting: Family

## 2021-09-11 ENCOUNTER — Encounter: Payer: Self-pay | Admitting: Family

## 2021-09-11 ENCOUNTER — Ambulatory Visit (INDEPENDENT_AMBULATORY_CARE_PROVIDER_SITE_OTHER): Payer: Medicare Other | Admitting: Family

## 2021-09-11 ENCOUNTER — Other Ambulatory Visit: Payer: Self-pay

## 2021-09-11 ENCOUNTER — Other Ambulatory Visit
Admission: RE | Admit: 2021-09-11 | Discharge: 2021-09-11 | Disposition: A | Discharge status: Home / Self Care | Payer: Medicare Other | Source: Ambulatory Visit | Attending: *Deleted | Admitting: *Deleted

## 2021-09-11 VITALS — BP 150/78 | HR 97 | Temp 97.7°F | Ht 64.0 in | Wt 199.4 lb

## 2021-09-11 DIAGNOSIS — J90 Pleural effusion, not elsewhere classified: Secondary | ICD-10-CM | POA: Diagnosis not present

## 2021-09-11 DIAGNOSIS — J441 Chronic obstructive pulmonary disease with (acute) exacerbation: Secondary | ICD-10-CM

## 2021-09-11 DIAGNOSIS — R0602 Shortness of breath: Secondary | ICD-10-CM

## 2021-09-11 DIAGNOSIS — Z20822 Contact with and (suspected) exposure to covid-19: Secondary | ICD-10-CM

## 2021-09-11 DIAGNOSIS — M7989 Other specified soft tissue disorders: Secondary | ICD-10-CM | POA: Diagnosis not present

## 2021-09-11 DIAGNOSIS — R600 Localized edema: Secondary | ICD-10-CM | POA: Diagnosis not present

## 2021-09-11 LAB — CBC WITH DIFFERENTIAL/PLATELET
Abs Immature Granulocytes: 0.04 10*3/uL (ref 0.00–0.07)
Basophils Absolute: 0 10*3/uL (ref 0.0–0.1)
Basophils Relative: 0 %
Eosinophils Absolute: 0.1 10*3/uL (ref 0.0–0.5)
Eosinophils Relative: 1 %
HCT: 32.1 % — ABNORMAL LOW (ref 36.0–46.0)
Hemoglobin: 10 g/dL — ABNORMAL LOW (ref 12.0–15.0)
Immature Granulocytes: 0 %
Lymphocytes Relative: 16 %
Lymphs Abs: 1.5 10*3/uL (ref 0.7–4.0)
MCH: 28.3 pg (ref 26.0–34.0)
MCHC: 31.2 g/dL (ref 30.0–36.0)
MCV: 90.9 fL (ref 80.0–100.0)
Monocytes Absolute: 0.6 10*3/uL (ref 0.1–1.0)
Monocytes Relative: 6 %
Neutro Abs: 7.3 10*3/uL (ref 1.7–7.7)
Neutrophils Relative %: 77 %
Platelets: 411 10*3/uL — ABNORMAL HIGH (ref 150–400)
RBC: 3.53 MIL/uL — ABNORMAL LOW (ref 3.87–5.11)
RDW: 15.1 % (ref 11.5–15.5)
WBC: 9.5 10*3/uL (ref 4.0–10.5)
nRBC: 0 % (ref 0.0–0.2)

## 2021-09-11 LAB — POC COVID19 BINAXNOW: SARS Coronavirus 2 Ag: NEGATIVE

## 2021-09-11 LAB — CREATININE, SERUM
Creatinine, Ser: 1.07 mg/dL — ABNORMAL HIGH (ref 0.44–1.00)
GFR, Estimated: 56 mL/min — ABNORMAL LOW (ref >60)

## 2021-09-11 LAB — POCT INFLUENZA A/B
Influenza A, POC: NEGATIVE
Influenza B, POC: NEGATIVE

## 2021-09-11 LAB — BRAIN NATRIURETIC PEPTIDE: B Natriuretic Peptide: 379.3 pg/mL — ABNORMAL HIGH (ref 0.0–100.0)

## 2021-09-11 MED ORDER — FLUTICASONE PROPIONATE 50 MCG/ACT NA SUSP: 2.0000 | Freq: Every day | NASAL | 0 refills | Status: DC

## 2021-09-11 MED ORDER — IPRATROPIUM-ALBUTEROL 0.5-2.5 (3) MG/3ML IN SOLN
3.0000 mL | Freq: Once | RESPIRATORY_TRACT | Status: AC
  Administered 2021-09-11: 3 mL via RESPIRATORY_TRACT

## 2021-09-11 MED ORDER — IOHEXOL 350 MG/ML SOLN
75.0000 mL | Freq: Once | INTRAVENOUS | Status: AC | PRN
  Administered 2021-09-11: 75 mL via INTRAVENOUS

## 2021-09-11 NOTE — Patient Instructions (Addendum)
Please stay eliquis until finished and you received directions from orthopedic team. ? ? ?I have ordered stat ultrasound left leg and stat CT angio to rule out clot in the leg and also pulmonary embolism ? ? ? ?

## 2021-09-11 NOTE — Assessment & Plan Note (Addendum)
Symptoms most consistent with COPD exacerbation. She is not tachypneic. No recent antibiotic.  Increase sputum volume and thickness, shortness of breath over the last day.  It is complicated by the fact that she recently had left total knee replacement. ?Duoneb with some improvement.  ?Unable to walk around the office as I typically would do as she is in a lot of pain with her left knee, left leg swelling and using a walker at this time.  Little utility with D Dimer due to recent surgery.  Although COPD exacerbation or even  PNA seems more likely,it is imperative to rule out more worrisome diagnoses for abrupt onset shortness of breath including DVT, PE.  Pending left leg ultrasound and CT angio stat to rule out DVT, PE.Consider augmentin per COPD exacerbation algorithm and risk factors for severe disease. Will all consider at home nebulizer with duoneb once results are  Reviewed.  ?

## 2021-09-11 NOTE — Assessment & Plan Note (Addendum)
No acute respiratory distress in office.  Dyspnea on exertion.  Suspect deconditioning after recent surgery, COPD large ring contributory.  SaO2 97%.  COVID and flu point-of-care negative .  She felt better after DuoNeb treatment today.  Per patient she been on Eliquis 2.5 mg however unsure how long she took medication and she reports she has 2 tablets left.  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  advised her to further discuss with orthopedics today for continued prophylaxis if standard of care.  Pending stat left leg ultrasound and CT angio to rule out DVT, PE, if negative, I would not further advise that she is on Eliquis unless advised by orthopedic team as standard practice.  ?

## 2021-09-11 NOTE — Progress Notes (Signed)
? ?Subjective:  ? ? Patient ID: Victoria Hanson, female    DOB: [birth year], 71 y.o.   MRN: [id number] ? ?CC: Victoria Hanson is a 71 y.o. female who presents today for an acute visit.   ? ?HPI: Accompanied by her sister ?Acute visit for cough and shortness of breath x 1 day ?She feels after she walks, she is 'huffing and puffing' which started yesterday. Endorses wheezing. No SOB when sitting and talking. SOB is not associated cp, left arm numbness, to jaw numbness. ?Increased sputum production and sputum has turned thick yellow.  ?No fever, chills. ?She is compliant with Advair, ProAir with relief temporarily. She used to have nebulizer machine though not sure where it is.  ?She follows with Dr Raul Del.  ? ? ? ?She is not using flonase ?She had previously been on Eliquis 2.5mg  and after surgery she reports;she states she was told to start 81mg  aspirin once completed eliquis ? ?She has follow-up with orthopedics today to remove staples.  ? ?Crt 1.08 ? ?She has h/o copd and reports asthma as well.  ? ?No history of heart failure.  Echocardiogram 02/23/2021 EF 60-65% ? ?She feels blood pressure is elevated today due to left knee pain.  She is compliant with amlodipine 2.5 mg ? ?She is following with [facility] orthopedics.  Total left knee replacement Dr. Dr Christella Hartigan 3/16 /2023 ?08/30/2021 ultrasound left leg no evidence of DVT ?History of hypertension, TIA, COPD, GERD, Parkinson's disease ? ?HISTORY:  ?Past Medical History:  ?Diagnosis Date  ? Anxiety   ? Asthma   ? Atypical chest pain   ? a. 2003 Cath: reportedly nl per pt [name]);  b. 04/2015 [place] Admission, neg troponin.  ? Colon cancer ([place]) 1999  ? a. 1999: Pt had partial colectomy. Did develop lung metastasis. Had right upper lobectomy in 01/2007 then had associated chemotherapy.  ? COPD (chronic obstructive pulmonary disease) ([place])   ? DDD (degenerative disc disease), lumbosacral   ? Depression   ? Echocardiogram abnormal   ? a. 02/2010 Echo: EF 55-60%, mild  LAE, no regional wall motion abnormalities  ? Elevated transaminase level   ? a. ? NASH;  b. 06/2015 - nl LFTs.  ? GERD (gastroesophageal reflux disease)   ? Hematuria   ? Holter monitor, abnormal   ? a. 12/2009 Holter monitoring: NSR, rare PACs and PVCs.  ? Hypercholesterolemia   ? Hyperlipidemia   ? Hypertension   ? Leaky heart valve   ? a. Per pt report - no evidence of valvular abnormalities on prior echoes.  ? Lesion of skin of Right breast 10/20/2017  ? Lung metastases 2008  ? a. S/P resection (lobectomy - right)  ? Morbid obesity ([place])   ? Nephrolithiasis   ? Parkinson disease ([place]) 08/2016  ? [name]  ? ?Past Surgical History:  ?Procedure Laterality Date  ? BREAST EXCISIONAL BIOPSY Right 10/07/2009  ? benign  ? CARDIAC CATHETERIZATION  2003  ? negative as per pt report  ? CHOLECYSTECTOMY  2000  ? COLON SURGERY    ? Colon cancer, removed part of large colon  ? COLONOSCOPY N/A 04/25/2015  ? Procedure: COLONOSCOPY;  Surgeon: Manya Silvas, MD;  Location: [facility] ENDOSCOPY;  Service: Endoscopy;  Laterality: N/A;  ? COLONOSCOPY WITH PROPOFOL N/A 06/14/2017  ? Procedure: COLONOSCOPY WITH PROPOFOL;  Surgeon: Manya Silvas, MD;  Location: [facility] ENDOSCOPY;  Service: Endoscopy;  Laterality: N/A;  ? COLONOSCOPY WITH PROPOFOL N/A 11/17/2019  ? Procedure: COLONOSCOPY WITH PROPOFOL;  Surgeon: [name], MD;  Location: [facility] ENDOSCOPY;  Service: Gastroenterology;  Laterality: N/A;  ? COLONOSCOPY WITH PROPOFOL N/A 03/21/2021  ? Procedure: COLONOSCOPY WITH PROPOFOL;  Surgeon: Lesly Rubenstein, MD;  Location: [facility] ENDOSCOPY;  Service: Endoscopy;  Laterality: N/A;  ? ESOPHAGOGASTRODUODENOSCOPY (EGD) WITH PROPOFOL N/A 03/21/2021  ? Procedure: ESOPHAGOGASTRODUODENOSCOPY (EGD) WITH PROPOFOL;  Surgeon: Lesly Rubenstein, MD;  Location: [facility] ENDOSCOPY;  Service: Endoscopy;  Laterality: N/A;  ? LOBECTOMY  01/2007  ? Right lung, upper lobe right side removed  ? TOTAL ABDOMINAL HYSTERECTOMY    ? abnormal bleeding  ? TUBAL LIGATION   1980  ? ?Family History  ?Problem Relation Age of Onset  ? Coronary artery disease Mother   ?     died @ 66 of MI.  ? Mental illness Mother   ? Diabetes Mother   ? Heart attack Father 2  ?     MI  ? Cancer Father   ?     lung - died in his mid-70's  ? Hypertension Father   ? Diabetes Father   ? Sudden death Brother   ? Mental illness Brother   ? Heart attack Brother   ?     MI @ 55, died @ age 35.  ? Breast cancer Neg Hx   ? ? ?Allergies: Macrobid [nitrofurantoin macrocrystal]; Antihistamines, diphenhydramine-type; Aspirin; Ceftin [cefuroxime axetil]; Celecoxib; Cymbalta [duloxetine hcl]; Diphenhydramine; Escitalopram; Lexapro [escitalopram oxalate]; Metronidazole; Nitrofurantoin; Zoloft [sertraline hcl]; and Ciprofloxacin ?Current Outpatient Medications on File Prior to Visit  ?Medication Sig Dispense Refill  ? ALPRAZolam (XANAX) 0.5 MG tablet TAKE 1/2 TABLET BY MOUTH ONCE DAILY AS NEEDED 30 tablet 0  ? amLODipine (NORVASC) 2.5 MG tablet TAKE 1 TABLET BY MOUTH DAILY 90 tablet 1  ? aspirin EC 81 MG tablet Take 81 mg by mouth as needed.     ? calcium-vitamin D (OSCAL WITH D) 500-200 MG-UNIT tablet Take 1 tablet by mouth.    ? carbidopa-levodopa (SINEMET) 25-100 MG tablet Take 1.5 in am and one at 11:00 and one in afternoon and 1.5 at night. 150 tablet 0  ? citalopram (CELEXA) 10 MG tablet TAKE ONE AND ONE-HALF TABLET BY MOUTH DAILY 90 tablet 1  ? diclofenac Sodium (VOLTAREN) 1 % GEL Apply topically 4 (four) times daily.    ? fluticasone-salmeterol (ADVAIR HFA) 115-21 MCG/ACT inhaler Inhale 2 puffs into the lungs 2 (two) times daily. 1 Inhaler 4  ? furosemide (LASIX) 20 MG tablet TAKE 1 TABLET BY MOUTH DAILY AS NEEDED 30 tablet 2  ? montelukast (SINGULAIR) 10 MG tablet TAKE 1 TABLET BY MOUTH AT BEDTIME 30 tablet 2  ? omeprazole (PRILOSEC) 20 MG capsule Take 1 capsule (20 mg total) by mouth daily. 90 capsule 1  ? PROAIR HFA 108 (90 Base) MCG/ACT inhaler INHALE 2 PUFFS BY MOUTH INTO THE LUNGS EVERY 6 HOURS AS NEEDED  FOR WHEEZING 8.5 g 2  ? rosuvastatin (CRESTOR) 5 MG tablet TAKE 1 TABLET BY MOUTH EVERY MONDAY, WEDNESDAY AND FRIDAY 90 tablet 1  ? traZODone (DESYREL) 50 MG tablet TAKE 2 TABLETS BY MOUTH AT BEDTIME 180 tablet 1  ? vitamin B-12 (CYANOCOBALAMIN) 100 MCG tablet Take 100 mcg by mouth daily.    ? ELIQUIS 2.5 MG TABS tablet Take 2.5 mg by mouth 2 (two) times daily. (Patient not taking: Reported on 09/11/2021)    ? traMADol (ULTRAM) 50 MG tablet Take 50 mg by mouth every 6 (six) hours as needed. (Patient not taking: Reported on 09/11/2021)    ? ?  No current facility-administered medications on file prior to visit.  ? ? ?Social History  ? ?Tobacco Use  ? Smoking status: Never  ?  Passive exposure: Yes  ? Smokeless tobacco: Never  ? Tobacco comments:  ?  husband and son smoked in her home.  ?Vaping Use  ? Vaping Use: Never used  ?Substance Use Topics  ? Alcohol use: Not Currently  ?  Alcohol/week: 0.0 standard drinks  ? Drug use: No  ? ? ?Review of Systems  ?Constitutional:  Negative for chills and fever.  ?HENT:  Positive for congestion.   ?Respiratory:  Positive for cough and shortness of breath.   ?Cardiovascular:  Positive for leg swelling (left leg). Negative for chest pain and palpitations.  ?Gastrointestinal:  Negative for nausea and vomiting.  ?Skin:  Positive for wound (left knee).  ?   ?Objective:  ?  ?BP (!) 150/78 (BP Location: Left Arm, Patient Position: Sitting, Cuff Size: Large)   Pulse 97   Temp 97.7 ?F (36.5 ?C) (Oral)   Ht 5\' 4"  (1.626 m)   Wt 199 lb 6.4 oz (90.4 kg)   SpO2 97%   BMI 34.23 kg/m?  ? ? ?Physical Exam ?Vitals reviewed.  ?Constitutional:   ?   Appearance: She is well-developed.  ?HENT:  ?   Head: Normocephalic and atraumatic.  ?   Right Ear: Hearing, tympanic membrane, ear canal and external ear normal. No decreased hearing noted. No drainage, swelling or tenderness. No middle ear effusion. No foreign body. Tympanic membrane is not erythematous or bulging.  ?   Left Ear: Hearing, tympanic  membrane, ear canal and external ear normal. No decreased hearing noted. No drainage, swelling or tenderness.  No middle ear effusion. No foreign body. Tympanic membrane is not erythematous or bulging.  ?   Nos

## 2021-09-12 ENCOUNTER — Telehealth: Payer: Self-pay | Admitting: Family

## 2021-09-12 DIAGNOSIS — I700 Atherosclerosis of aorta: Secondary | ICD-10-CM | POA: Diagnosis not present

## 2021-09-12 DIAGNOSIS — J90 Pleural effusion, not elsewhere classified: Secondary | ICD-10-CM | POA: Diagnosis not present

## 2021-09-12 DIAGNOSIS — J810 Acute pulmonary edema: Secondary | ICD-10-CM | POA: Diagnosis not present

## 2021-09-12 DIAGNOSIS — R0609 Other forms of dyspnea: Secondary | ICD-10-CM | POA: Diagnosis not present

## 2021-09-12 NOTE — Telephone Encounter (Signed)
Please call pt again this afternoon  ? ?Left message at home and also tried to call patient's sister, London Pepper at [phone].  Unable to reach her  ? ? ? I wanted to understand how appointment with Dr Vella Kohler, pulmonology was this morning.   ? ?Please asked patient how she is feeling and how her shortness of breath is.  I am unable to see Dr Gust Brooms note.  Did he prescribed any new medications?  What will what was his recommendation?  ? ?

## 2021-09-12 NOTE — Telephone Encounter (Signed)
Called and spoke with sister London Pepper) and patient ? ?[id number] ? ?She had seen orthopedics and had sutures removed ? ?Discussed negative left leg ultrasound for DVT ? ?CT angio negative for pulmonary embolism.  Shows small to moderate bilateral pleural effusions more on the right side.  Faint groundglass densities in the upper and midlung fields.  Fatty infiltration of the liver ?There are no changes noted thoracic spine. ?Elevated BNP ?Anemia ? ?Call pulmonology, Dr. Raul Del and Domingo Mend spoke with his nurse;  he added her to his schedule 09/12/21 at 11am to discuss pulmonary effusion- whether infectious  ? ?I advise her to take additional dose of Lasix 20 mg this evening.  She is taking 1 earlier this morning. ?Will call patient after appointment ?

## 2021-09-13 ENCOUNTER — Encounter: Payer: Self-pay | Admitting: Family

## 2021-09-13 ENCOUNTER — Other Ambulatory Visit: Payer: Self-pay | Admitting: Family

## 2021-09-13 ENCOUNTER — Other Ambulatory Visit: Payer: Self-pay

## 2021-09-13 ENCOUNTER — Encounter: Payer: Self-pay | Admitting: Internal Medicine

## 2021-09-13 ENCOUNTER — Telehealth: Payer: Self-pay | Admitting: Internal Medicine

## 2021-09-13 DIAGNOSIS — K760 Fatty (change of) liver, not elsewhere classified: Secondary | ICD-10-CM | POA: Insufficient documentation

## 2021-09-13 DIAGNOSIS — D649 Anemia, unspecified: Secondary | ICD-10-CM

## 2021-09-13 DIAGNOSIS — Z96659 Presence of unspecified artificial knee joint: Secondary | ICD-10-CM | POA: Insufficient documentation

## 2021-09-13 NOTE — Telephone Encounter (Signed)
Patient requesting a refill on her ELIQUIS 2.5 MG TABS tablet ?

## 2021-09-13 NOTE — Telephone Encounter (Signed)
Spoke to patient this morning. Patient stated that she is feeling ok.Patient stated that her appointment at Pulmonology went fine, she was prescribed 2 antibiotics ( augmentin and Zpack)...by Dr. Vella Kohler and has to go back to see him in 14 days. No shortness of breath at this time. ?

## 2021-09-13 NOTE — Telephone Encounter (Signed)
Pt advised - stated ok, will not take. Doesn't feel needs sooner follow up now, just wanted instructions from Dr. Nicki Reaper ?

## 2021-09-13 NOTE — Telephone Encounter (Signed)
We can schedule her an earlier appt.  I can see her on 09/21/21 - 11:30 to discuss.  Per review of ultrasound and CT - no clot present. This is why they informed her that once her supply of eliquis is complete - may stop.  If questions of if needs something more let me know.  If desires to stay on eliquis until we meet next week - ok to take x 1 more week.  ?

## 2021-09-13 NOTE — Telephone Encounter (Signed)
Noted  

## 2021-09-18 ENCOUNTER — Telehealth: Payer: Self-pay | Admitting: Internal Medicine

## 2021-09-18 DIAGNOSIS — Z96652 Presence of left artificial knee joint: Secondary | ICD-10-CM

## 2021-09-18 NOTE — Telephone Encounter (Signed)
Opened in error

## 2021-09-19 DIAGNOSIS — M25662 Stiffness of left knee, not elsewhere classified: Secondary | ICD-10-CM | POA: Diagnosis not present

## 2021-09-19 DIAGNOSIS — M25562 Pain in left knee: Secondary | ICD-10-CM | POA: Diagnosis not present

## 2021-09-22 DIAGNOSIS — M25662 Stiffness of left knee, not elsewhere classified: Secondary | ICD-10-CM | POA: Diagnosis not present

## 2021-09-22 DIAGNOSIS — M25562 Pain in left knee: Secondary | ICD-10-CM | POA: Diagnosis not present

## 2021-09-26 ENCOUNTER — Other Ambulatory Visit: Payer: Self-pay | Admitting: Internal Medicine

## 2021-09-26 DIAGNOSIS — M25562 Pain in left knee: Secondary | ICD-10-CM | POA: Diagnosis not present

## 2021-09-26 DIAGNOSIS — M25662 Stiffness of left knee, not elsewhere classified: Secondary | ICD-10-CM | POA: Diagnosis not present

## 2021-09-27 NOTE — Telephone Encounter (Signed)
Rx ok'd for xanax #30 with no refills.  ?

## 2021-09-28 DIAGNOSIS — M25662 Stiffness of left knee, not elsewhere classified: Secondary | ICD-10-CM | POA: Diagnosis not present

## 2021-10-03 ENCOUNTER — Ambulatory Visit (INDEPENDENT_AMBULATORY_CARE_PROVIDER_SITE_OTHER): Payer: Medicare Other | Admitting: Internal Medicine

## 2021-10-03 DIAGNOSIS — R739 Hyperglycemia, unspecified: Secondary | ICD-10-CM | POA: Diagnosis not present

## 2021-10-03 DIAGNOSIS — I700 Atherosclerosis of aorta: Secondary | ICD-10-CM | POA: Diagnosis not present

## 2021-10-03 DIAGNOSIS — K219 Gastro-esophageal reflux disease without esophagitis: Secondary | ICD-10-CM

## 2021-10-03 DIAGNOSIS — E7800 Pure hypercholesterolemia, unspecified: Secondary | ICD-10-CM

## 2021-10-03 DIAGNOSIS — I10 Essential (primary) hypertension: Secondary | ICD-10-CM

## 2021-10-03 DIAGNOSIS — G20 Parkinson's disease: Secondary | ICD-10-CM

## 2021-10-03 DIAGNOSIS — D649 Anemia, unspecified: Secondary | ICD-10-CM | POA: Diagnosis not present

## 2021-10-03 DIAGNOSIS — N183 Chronic kidney disease, stage 3 unspecified: Secondary | ICD-10-CM

## 2021-10-03 DIAGNOSIS — R0602 Shortness of breath: Secondary | ICD-10-CM | POA: Diagnosis not present

## 2021-10-03 DIAGNOSIS — Z8589 Personal history of malignant neoplasm of other organs and systems: Secondary | ICD-10-CM | POA: Diagnosis not present

## 2021-10-03 DIAGNOSIS — F339 Major depressive disorder, recurrent, unspecified: Secondary | ICD-10-CM

## 2021-10-03 NOTE — Progress Notes (Signed)
Patient ID: Victoria Hanson, female   DOB: April 03, 1951, 71 y.o.   MRN: [id number] ? ? ?Virtual Visit via telephone Note ? ?All issues noted in this document were discussed and addressed.  No physical exam was performed (except for noted visual exam findings with Video Visits).  ? ?I connected with Victoria Hanson today by telephone and verified that I am speaking with the correct person using two identifiers. ?Location patient: home ?Location provider: work  ?Persons participating in the telephone visit: patient, provider ? ?The limitations, risks, security and privacy concerns of performing an evaluation and management service by telephone and the availability of in person appointments have been discussed.  It has also been discussed with the patient that there may be a patient responsible charge related to this service. The patient expressed understanding and agreed to proceed. ? ?Reason for visit: follow up appt ? ?HPI: ?Was evaluated 09/11/21 - for cough and sob.  Note reviewed.  Symptoms felt to be c/w COPD exacerbation.  Given duoneb with some improvement.  CT - no PE.  Ground-glass densities seen in the upper and mid lung fields suggesting possible pulmonary edema. There are linear patchy infiltrates in both lower lung fields suggesting  ?atelectasis/pneumonia. Small to moderate bilateral pleural  ?effusions, more so on the right side. Saw Dr Raul Del 09/12/21.  Instructed to continue albuterol inhaler prn and continue advair, singulair, prilosec, flonase and nasal rinses.  Was treated for pneumonia.  Recommended f/u cxr in 10-14 days.  Was also prescribed lasix.  F/u today - reports breathing is better.  No chest pain reported.  Is s/p knee surgery.  Continues f/u with ortho/PT.  Eating.  No nausea or vomiting reported.  Taking iron (intermittently).   ? ? ?ROS: See pertinent positives and negatives per HPI. ? ?Past Medical History:  ?Diagnosis Date  ? Anxiety   ? Asthma   ? Atypical chest pain   ? a. 2003 Cath:  reportedly nl per pt [name]);  b. 04/2015 [place] [facility] Admission, neg troponin.  ? Colon cancer ([place]) 1999  ? a. 1999: Pt had partial colectomy. Did develop lung metastasis. Had right upper lobectomy in 01/2007 then had associated chemotherapy.  ? COPD (chronic obstructive pulmonary disease) ([place])   ? DDD (degenerative disc disease), lumbosacral   ? Depression   ? Echocardiogram abnormal   ? a. 02/2010 Echo: EF 55-60%, mild LAE, no regional wall motion abnormalities  ? Elevated transaminase level   ? a. ? NASH;  b. 06/2015 - nl LFTs.  ? GERD (gastroesophageal reflux disease)   ? Hematuria   ? Holter monitor, abnormal   ? a. 12/2009 Holter monitoring: NSR, rare PACs and PVCs.  ? Hypercholesterolemia   ? Hyperlipidemia   ? Hypertension   ? Leaky heart valve   ? a. Per pt report - no evidence of valvular abnormalities on prior echoes.  ? Lesion of skin of Right breast 10/20/2017  ? Lung metastases 2008  ? a. S/P resection (lobectomy - right)  ? Morbid obesity ([place])   ? Nephrolithiasis   ? Parkinson disease ([place]) 08/2016  ? [name]  ? ? ?Past Surgical History:  ?Procedure Laterality Date  ? BREAST EXCISIONAL BIOPSY Right 10/07/2009  ? benign  ? CARDIAC CATHETERIZATION  2003  ? negative as per pt report  ? CHOLECYSTECTOMY  2000  ? COLON SURGERY    ? Colon cancer, removed part of large colon  ? COLONOSCOPY N/A 04/25/2015  ? Procedure: COLONOSCOPY;  Surgeon: Manya Silvas,  MD;  Location: [facility] ENDOSCOPY;  Service: Endoscopy;  Laterality: N/A;  ? COLONOSCOPY WITH PROPOFOL N/A 06/14/2017  ? Procedure: COLONOSCOPY WITH PROPOFOL;  Surgeon: Manya Silvas, MD;  Location: [facility] ENDOSCOPY;  Service: Endoscopy;  Laterality: N/A;  ? COLONOSCOPY WITH PROPOFOL N/A 11/17/2019  ? Procedure: COLONOSCOPY WITH PROPOFOL;  Surgeon: [name], MD;  Location: [facility] ENDOSCOPY;  Service: Gastroenterology;  Laterality: N/A;  ? COLONOSCOPY WITH PROPOFOL N/A 03/21/2021  ? Procedure: COLONOSCOPY WITH PROPOFOL;  Surgeon: Lesly Rubenstein, MD;  Location:  [facility] ENDOSCOPY;  Service: Endoscopy;  Laterality: N/A;  ? ESOPHAGOGASTRODUODENOSCOPY (EGD) WITH PROPOFOL N/A 03/21/2021  ? Procedure: ESOPHAGOGASTRODUODENOSCOPY (EGD) WITH PROPOFOL;  Surgeon: Lesly Rubenstein, MD;  Location: [facility] ENDOSCOPY;  Service: Endoscopy;  Laterality: N/A;  ? LOBECTOMY  01/2007  ? Right lung, upper lobe right side removed  ? TOTAL ABDOMINAL HYSTERECTOMY    ? abnormal bleeding  ? TUBAL LIGATION  1980  ? ? ?Family History  ?Problem Relation Age of Onset  ? Coronary artery disease Mother   ?     died @ 66 of MI.  ? Mental illness Mother   ? Diabetes Mother   ? Heart attack Father 73  ?     MI  ? Cancer Father   ?     lung - died in his mid-70's  ? Hypertension Father   ? Diabetes Father   ? Sudden death Brother   ? Mental illness Brother   ? Heart attack Brother   ?     MI @ 40, died @ age 1.  ? Breast cancer Neg Hx   ? ? ?SOCIAL HX: reviewed.  ? ? ?Current Outpatient Medications:  ?  ALPRAZolam (XANAX) 0.5 MG tablet, TAKE 1/2 TABLET BY MOUTH ONCE DAILY AS NEEDED, Disp: 30 tablet, Rfl: 0 ?  amLODipine (NORVASC) 2.5 MG tablet, TAKE 1 TABLET BY MOUTH DAILY, Disp: 90 tablet, Rfl: 1 ?  aspirin EC 81 MG tablet, Take 81 mg by mouth as needed. , Disp: , Rfl:  ?  calcium-vitamin D (OSCAL WITH D) 500-200 MG-UNIT tablet, Take 1 tablet by mouth., Disp: , Rfl:  ?  carbidopa-levodopa (SINEMET) 25-100 MG tablet, Take 1.5 in am and one at 11:00 and one in afternoon and 1.5 at night., Disp: 150 tablet, Rfl: 0 ?  citalopram (CELEXA) 10 MG tablet, TAKE ONE AND ONE-HALF TABLET BY MOUTH DAILY, Disp: 90 tablet, Rfl: 1 ?  diclofenac Sodium (VOLTAREN) 1 % GEL, Apply topically 4 (four) times daily., Disp: , Rfl:  ?  ELIQUIS 2.5 MG TABS tablet, Take 2.5 mg by mouth 2 (two) times daily., Disp: , Rfl:  ?  fluticasone (FLONASE) 50 MCG/ACT nasal spray, Place 2 sprays into both nostrils daily., Disp: 11.1 mL, Rfl: 0 ?  fluticasone-salmeterol (ADVAIR HFA) 115-21 MCG/ACT inhaler, Inhale 2 puffs into the lungs 2 (two) times  daily., Disp: 1 Inhaler, Rfl: 4 ?  furosemide (LASIX) 20 MG tablet, TAKE 1 TABLET BY MOUTH DAILY AS NEEDED, Disp: 30 tablet, Rfl: 2 ?  montelukast (SINGULAIR) 10 MG tablet, TAKE 1 TABLET BY MOUTH AT BEDTIME, Disp: 30 tablet, Rfl: 2 ?  omeprazole (PRILOSEC) 20 MG capsule, Take 1 capsule (20 mg total) by mouth daily., Disp: 90 capsule, Rfl: 1 ?  PROAIR HFA 108 (90 Base) MCG/ACT inhaler, INHALE 2 PUFFS BY MOUTH INTO THE LUNGS EVERY 6 HOURS AS NEEDED FOR WHEEZING, Disp: 8.5 g, Rfl: 2 ?  rosuvastatin (CRESTOR) 5 MG tablet, TAKE 1 TABLET BY MOUTH EVERY  MONDAY, WEDNESDAY AND FRIDAY, Disp: 90 tablet, Rfl: 1 ?  traMADol (ULTRAM) 50 MG tablet, Take 50 mg by mouth every 6 (six) hours as needed., Disp: , Rfl:  ?  traZODone (DESYREL) 50 MG tablet, TAKE 2 TABLETS BY MOUTH AT BEDTIME, Disp: 180 tablet, Rfl: 1 ?  vitamin B-12 (CYANOCOBALAMIN) 100 MCG tablet, Take 100 mcg by mouth daily., Disp: , Rfl:  ? ?EXAM: ? ?GENERAL: alert. Sounds to be in no acute distress.  Answering questions appropriately.  ? ?PSYCH/NEURO: pleasant and cooperative, no obvious depression or anxiety, speech and thought processing grossly intact ? ?ASSESSMENT AND PLAN: ? ?Discussed the following assessment and plan: ? ?Problem List Items Addressed This Visit   ? ? Anemia  ?  Apparently taking iron intermittently.  Check cbc and iron studies.   ? ?  ?  ? Relevant Orders  ? CBC with Differential/Platelet  ? IBC + Ferritin  ? Aortic atherosclerosis ([place])  ?  Had reported being off crestor previously.  Need to confirm taking.  ? ?  ?  ? CKD (chronic kidney disease), stage III ([place])  ?  On lasix.  Schedule f/u metabolic panel to assess kidney function.  Avoid antiinflammatories.   ? ?  ?  ? Depression, recurrent ([place])  ?  Continue citalopram.  Stable.  ? ?  ?  ? GERD (gastroesophageal reflux disease)  ?  No acid reflux symptoms reported.  Prilosec.  ? ?  ?  ? History of malignant neoplasm metastatic to lung  ?  S/p lobectomy.  Followed by pulmonary.  Chest CT as  outlined.   ? ?  ?  ? Hypercholesterolemia  ?  Low cholesterol diet and exercise.  Follow lipid panel.  ? ?  ?  ? Relevant Orders  ? Lipid Profile  ? Hepatic function panel  ? Hyperglycemia  ?  Low carb di

## 2021-10-06 DIAGNOSIS — Z96652 Presence of left artificial knee joint: Secondary | ICD-10-CM | POA: Diagnosis not present

## 2021-10-11 ENCOUNTER — Other Ambulatory Visit: Payer: Self-pay

## 2021-10-11 ENCOUNTER — Other Ambulatory Visit: Payer: Self-pay | Admitting: Orthopedic Surgery

## 2021-10-11 ENCOUNTER — Telehealth: Payer: Self-pay | Admitting: Internal Medicine

## 2021-10-11 ENCOUNTER — Ambulatory Visit
Admission: RE | Admit: 2021-10-11 | Discharge: 2021-10-11 | Disposition: A | Discharge status: Home / Self Care | Payer: Medicare Other | Source: Ambulatory Visit | Attending: Orthopedic Surgery | Admitting: Orthopedic Surgery

## 2021-10-11 DIAGNOSIS — R2242 Localized swelling, mass and lump, left lower limb: Secondary | ICD-10-CM | POA: Diagnosis not present

## 2021-10-11 DIAGNOSIS — E7800 Pure hypercholesterolemia, unspecified: Secondary | ICD-10-CM

## 2021-10-11 DIAGNOSIS — M7989 Other specified soft tissue disorders: Secondary | ICD-10-CM | POA: Diagnosis not present

## 2021-10-11 DIAGNOSIS — I10 Essential (primary) hypertension: Secondary | ICD-10-CM

## 2021-10-11 NOTE — Telephone Encounter (Signed)
Patient called and stated that Dr Nicki Reaper wanted her to come into office for labs and chest x-ray. Appointment was made. Please add chest x-ray orders. ?

## 2021-10-12 ENCOUNTER — Encounter: Payer: Self-pay | Admitting: Internal Medicine

## 2021-10-12 DIAGNOSIS — R0609 Other forms of dyspnea: Secondary | ICD-10-CM | POA: Diagnosis not present

## 2021-10-12 DIAGNOSIS — I272 Pulmonary hypertension, unspecified: Secondary | ICD-10-CM | POA: Diagnosis not present

## 2021-10-12 DIAGNOSIS — J90 Pleural effusion, not elsewhere classified: Secondary | ICD-10-CM | POA: Diagnosis not present

## 2021-10-12 DIAGNOSIS — R600 Localized edema: Secondary | ICD-10-CM | POA: Diagnosis not present

## 2021-10-12 DIAGNOSIS — R918 Other nonspecific abnormal finding of lung field: Secondary | ICD-10-CM | POA: Diagnosis not present

## 2021-10-12 DIAGNOSIS — J439 Emphysema, unspecified: Secondary | ICD-10-CM | POA: Diagnosis not present

## 2021-10-12 NOTE — Assessment & Plan Note (Signed)
On amlodipine. Now taking lasix.  Follow pressures.  Check metabolic panel.  ?

## 2021-10-12 NOTE — Telephone Encounter (Signed)
Lm for pt to cb.

## 2021-10-12 NOTE — Assessment & Plan Note (Signed)
Low carb diet and exercise.  Follow met b and a1c.  ?

## 2021-10-12 NOTE — Assessment & Plan Note (Signed)
Low cholesterol diet and exercise.  Follow lipid panel.   

## 2021-10-12 NOTE — Assessment & Plan Note (Signed)
On lasix.  Schedule f/u metabolic panel to assess kidney function.  Avoid antiinflammatories.   ?

## 2021-10-12 NOTE — Assessment & Plan Note (Signed)
Had reported being off crestor previously.  Need to confirm taking.  ?

## 2021-10-12 NOTE — Assessment & Plan Note (Signed)
Continues on sinemet.  Followed by neurology.  ?

## 2021-10-12 NOTE — Assessment & Plan Note (Signed)
Continue citalopram.  Stable.  ?

## 2021-10-12 NOTE — Assessment & Plan Note (Signed)
S/p lobectomy.  Followed by pulmonary.  Chest CT as outlined.   ?

## 2021-10-12 NOTE — Telephone Encounter (Signed)
Order placed for cxr.  Also, need to confirm if she is taking crestor.   ?

## 2021-10-12 NOTE — Assessment & Plan Note (Signed)
Recent knee surgery.  Recent evaluation for sob as outlined.  CT as outlined.  Saw Dr Raul Del 09/12/21.  Instructed to continue albuterol inhaler prn and continue advair, singulair, prilosec, flonase and nasal rinses.  Was treated for pneumonia.  Recommended f/u cxr in 10-14 days.  Was also prescribed lasix. Is breathing better.  No increased cough or congestion.  Discussed f/u cxr as recommended.  Also needs labs to assess kidney function.   ?

## 2021-10-12 NOTE — Assessment & Plan Note (Signed)
Apparently taking iron intermittently.  Check cbc and iron studies.   ?

## 2021-10-12 NOTE — Assessment & Plan Note (Signed)
No acid reflux symptoms reported.  Prilosec.  ?

## 2021-10-13 ENCOUNTER — Ambulatory Visit: Payer: Medicare Other

## 2021-10-13 ENCOUNTER — Other Ambulatory Visit: Payer: Self-pay | Admitting: Internal Medicine

## 2021-10-13 ENCOUNTER — Other Ambulatory Visit (INDEPENDENT_AMBULATORY_CARE_PROVIDER_SITE_OTHER): Payer: Medicare Other

## 2021-10-13 DIAGNOSIS — D649 Anemia, unspecified: Secondary | ICD-10-CM

## 2021-10-13 DIAGNOSIS — E7800 Pure hypercholesterolemia, unspecified: Secondary | ICD-10-CM

## 2021-10-13 DIAGNOSIS — I10 Essential (primary) hypertension: Secondary | ICD-10-CM | POA: Diagnosis not present

## 2021-10-13 LAB — CBC WITH DIFFERENTIAL/PLATELET
Basophils Absolute: 0 10*3/uL (ref 0.0–0.1)
Basophils Relative: 0.7 % (ref 0.0–3.0)
Eosinophils Absolute: 0.1 10*3/uL (ref 0.0–0.7)
Eosinophils Relative: 0.9 % (ref 0.0–5.0)
HCT: 34.5 % — ABNORMAL LOW (ref 36.0–46.0)
Hemoglobin: 11.3 g/dL — ABNORMAL LOW (ref 12.0–15.0)
Lymphocytes Relative: 18.8 % (ref 12.0–46.0)
Lymphs Abs: 1.2 10*3/uL (ref 0.7–4.0)
MCHC: 32.6 g/dL (ref 30.0–36.0)
MCV: 88.2 fl (ref 78.0–100.0)
Monocytes Absolute: 0.3 10*3/uL (ref 0.1–1.0)
Monocytes Relative: 5.6 % (ref 3.0–12.0)
Neutro Abs: 4.6 10*3/uL (ref 1.4–7.7)
Neutrophils Relative %: 74 % (ref 43.0–77.0)
Platelets: 288 10*3/uL (ref 150.0–400.0)
RBC: 3.92 Mil/uL (ref 3.87–5.11)
RDW: 15.1 % (ref 11.5–15.5)
WBC: 6.2 10*3/uL (ref 4.0–10.5)

## 2021-10-13 LAB — LIPID PANEL
Cholesterol: 149 mg/dL (ref 0–200)
HDL: 57.1 mg/dL (ref >39.00)
LDL Cholesterol: 72 mg/dL (ref 0–99)
NonHDL: 91.95
Total CHOL/HDL Ratio: 3
Triglycerides: 100 mg/dL (ref 0.0–149.0)
VLDL: 20 mg/dL (ref 0.0–40.0)

## 2021-10-13 LAB — BASIC METABOLIC PANEL
BUN: 16 mg/dL (ref 6–23)
CO2: 27 mEq/L (ref 19–32)
Calcium: 9.1 mg/dL (ref 8.4–10.5)
Chloride: 104 mEq/L (ref 96–112)
Creatinine, Ser: 1.03 mg/dL (ref 0.40–1.20)
GFR: 54.55 mL/min — ABNORMAL LOW (ref >60.00)
Glucose, Bld: 93 mg/dL (ref 70–99)
Potassium: 4.1 mEq/L (ref 3.5–5.1)
Sodium: 140 mEq/L (ref 135–145)

## 2021-10-13 LAB — IBC + FERRITIN
Ferritin: 68.5 ng/mL (ref 10.0–291.0)
Iron: 54 ug/dL (ref 42–145)
Saturation Ratios: 18.5 % — ABNORMAL LOW (ref 20.0–50.0)
TIBC: 292.6 ug/dL (ref 250.0–450.0)
Transferrin: 209 mg/dL — ABNORMAL LOW (ref 212.0–360.0)

## 2021-10-13 LAB — HEPATIC FUNCTION PANEL
ALT: 3 U/L (ref 0–35)
AST: 12 U/L (ref 0–37)
Albumin: 3.9 g/dL (ref 3.5–5.2)
Alkaline Phosphatase: 72 U/L (ref 39–117)
Bilirubin, Direct: 0.1 mg/dL (ref 0.0–0.3)
Total Bilirubin: 0.5 mg/dL (ref 0.2–1.2)
Total Protein: 6.9 g/dL (ref 6.0–8.3)

## 2021-10-13 NOTE — Addendum Note (Signed)
Addended by: Leeanne Rio on: 10/13/2021 09:07 AM ? ? Modules accepted: Orders ? ?

## 2021-10-16 NOTE — Telephone Encounter (Signed)
Reviewed Dr Leane Platt note from 10/12/21.  It appears he recommended f/u cxr in 2 months, so needs to hold on cxr here at this time.  Does need the lab appt - was supposed to have scheduled 10-14 days after last appt.  Appears to have not been scheduled.  ?

## 2021-10-16 NOTE — Telephone Encounter (Signed)
S/w pt - confirmed taking Crestor. ?Pt advised CXR order was placed. ?

## 2021-10-17 DIAGNOSIS — M25662 Stiffness of left knee, not elsewhere classified: Secondary | ICD-10-CM | POA: Diagnosis not present

## 2021-10-17 DIAGNOSIS — M25562 Pain in left knee: Secondary | ICD-10-CM | POA: Diagnosis not present

## 2021-10-21 ENCOUNTER — Telehealth: Payer: Self-pay | Admitting: Internal Medicine

## 2021-10-21 NOTE — Telephone Encounter (Signed)
-----   Message from Larey Days sent at 10/16/2021  9:39 AM EDT ----- ?Regarding: RE: appt ?Her physical has been scheduled for 11/29/21 the patient is aware. Her CXR was done at Dr. Gust Brooms office , labs done on 10/13/21 in this office. ?----- Message ----- ?From: Einar Pheasant, MD ?Sent: 10/14/2021   9:27 AM EDT ?To: Larey Days ?Subject: appt                                          ? ?Ms Halls was seen on 10/03/21.  Needed a physical scheduled in 8 weeks.  It does not appear has been scheduled.  Can you help with this?  Thanks.   ? ?Dr Nicki Reaper ? ? ?

## 2021-10-23 ENCOUNTER — Telehealth: Payer: Self-pay | Admitting: Internal Medicine

## 2021-10-23 NOTE — Telephone Encounter (Signed)
Received notice that she is overdue a mammogram.  Need to schedule.  ?

## 2021-10-24 NOTE — Telephone Encounter (Signed)
Lm for pt to cb re : date/time preference? ?

## 2021-10-30 NOTE — Telephone Encounter (Signed)
Lm for pt to cb.

## 2021-10-31 ENCOUNTER — Other Ambulatory Visit: Payer: Self-pay | Admitting: Internal Medicine

## 2021-11-01 IMAGING — MR MR HEAD W/O CM
13 series · 28 of 28 positions shown · non-contrast
Comparison: CT head 11/02/2019, MRI 09/01/2015

CLINICAL DATA: Dizziness

EXAM:
MRI HEAD WITHOUT CONTRAST
TECHNIQUE: Multiplanar, multiecho pulse sequences of the brain and surrounding
structures were obtained without intravenous contrast.

[Series 5: ax dwi_tracew · axial · 3.0mm · 0.60mm/px · z∈[-71,+71]mm · 3 of 48 slices shown]
[im 1/48]
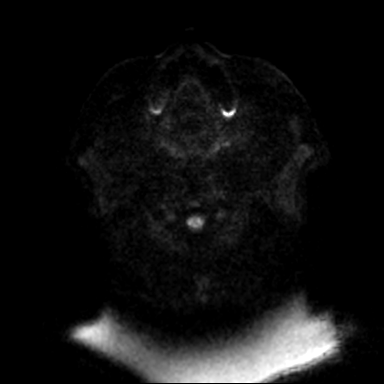
[im 24/48]
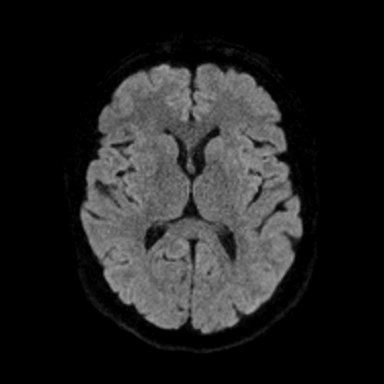
[im 48/48]
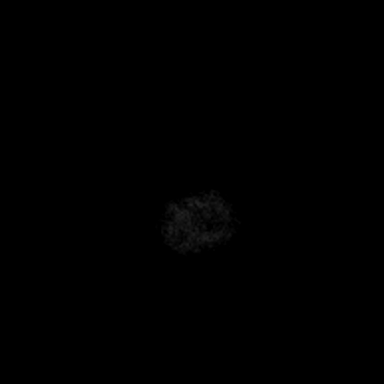

[Series 6: ax dwi_adc · axial · 3.0mm · 0.60mm/px · z∈[-71,+71]mm · 3 of 48 slices shown]
[im 1/48]
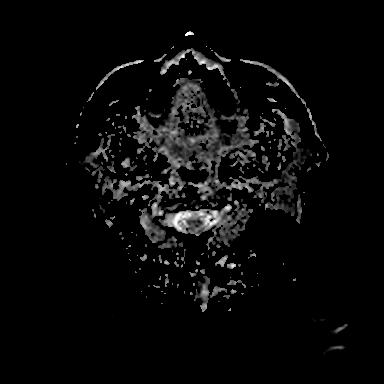
[im 24/48]
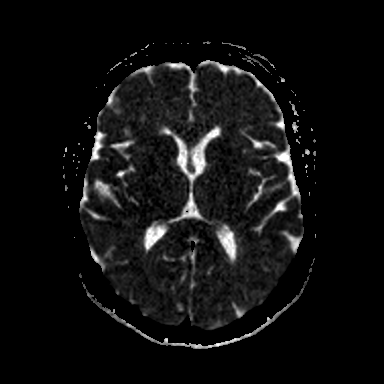
[im 48/48]
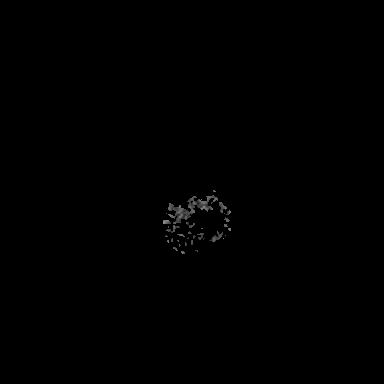

[Series 7: cor dwi_tracew · coronal · 5.0mm · 0.60mm/px · 1 of 38 slices shown]
[im 1/38]
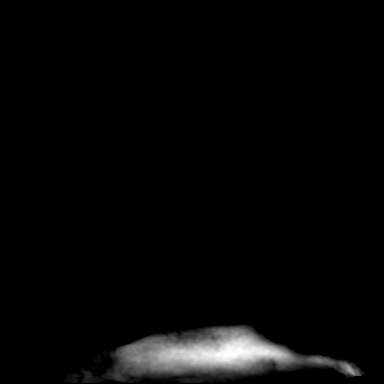

[Series 8: cor dwi_adc · coronal · 5.0mm · 0.60mm/px · 1 of 38 slices shown]
[im 1/38]
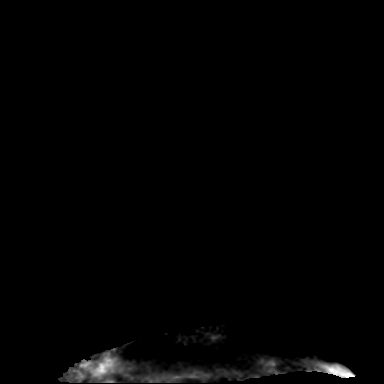

[Series 9: T1 · sagittal · 5.0mm · 0.62mm/px · 1 of 23 slices shown (1 of 2)]
[im 1/23]
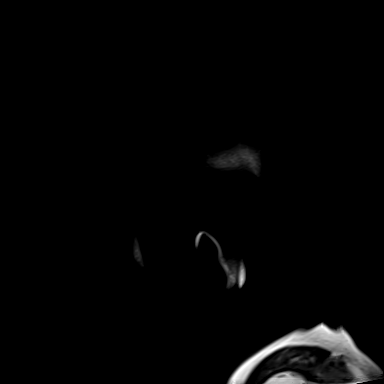

[Series 10: T2 · axial · 5.0mm · 0.53mm/px · 1 of 26 slices shown (1 of 2)]
[im 1/26]
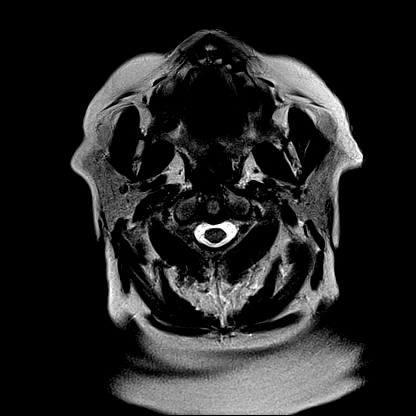

[Series 11: mag_images · axial · 3.0mm · 0.90mm/px · z∈[-81,+80]mm · 2 of 60 slices shown]
[im 1/60]
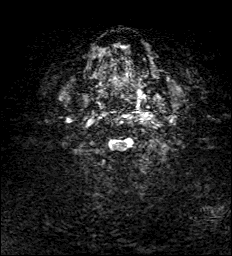
[im 60/60]
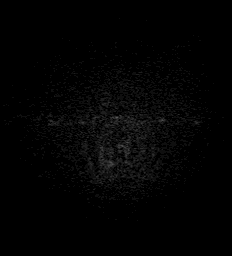

[Series 12: pha_images · axial · 3.0mm · 0.90mm/px · z∈[-81,+77]mm · 2 of 58 slices shown]
[im 1/58]
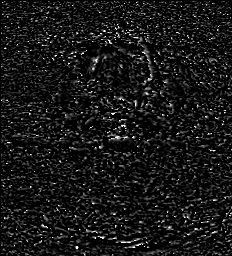
[im 58/58]
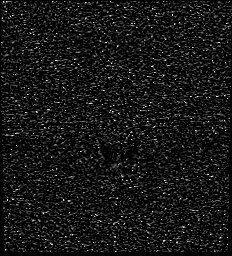

[Series 13: swi_images · axial · 3.0mm · 0.90mm/px · z∈[-81,+80]mm · 2 of 60 slices shown]
[im 1/60]
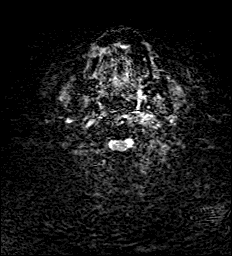
[im 60/60]
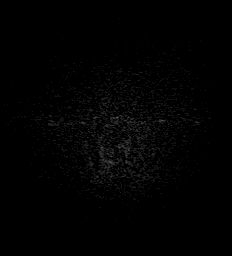

[Series 15: FLAIR · axial · 3.0mm · 0.53mm/px · z∈[-76,+71]mm · 2 of 55 slices shown (1 of 2)]
[im 1/55]
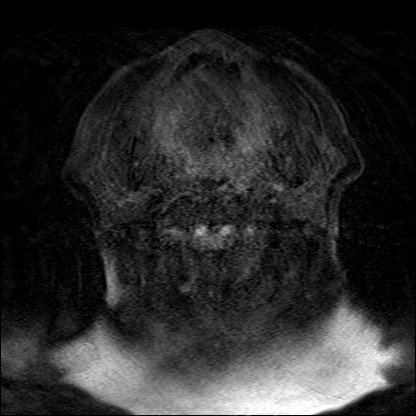
[im 55/55]
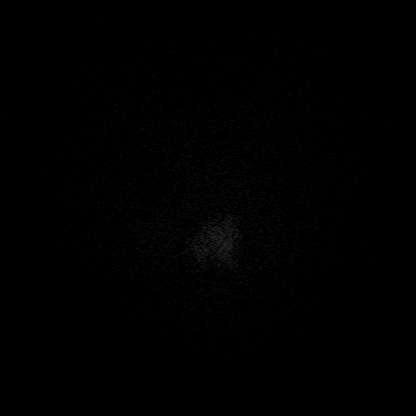

[Series 16: T1 · axial · 1.0mm · 0.98mm/px · z∈[-76,+84]mm · 7 of 176 slices shown (2 of 2)]
[im 1/176]
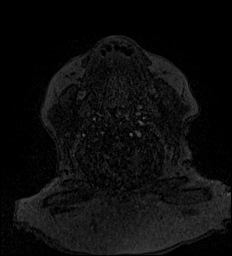
[im 30/176]
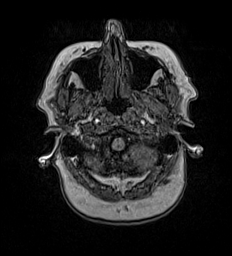
[im 59/176]
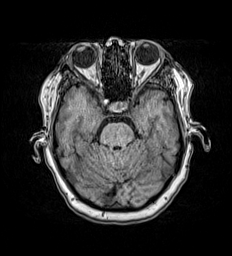
[im 88/176]
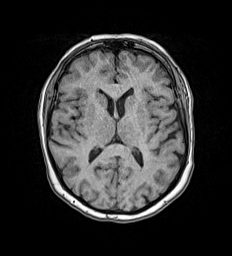
[im 117/176]
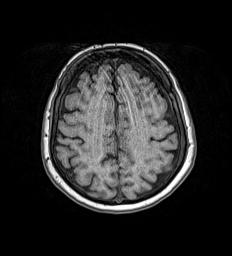
[im 146/176]
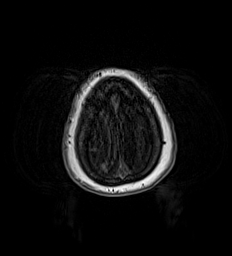
[im 176/176]
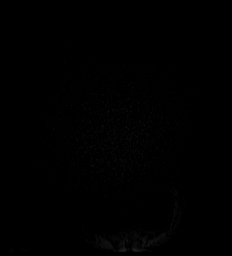

[Series 17: T2 · coronal · 5.0mm · 0.57mm/px · 1 of 29 slices shown (2 of 2)]
[im 1/29]
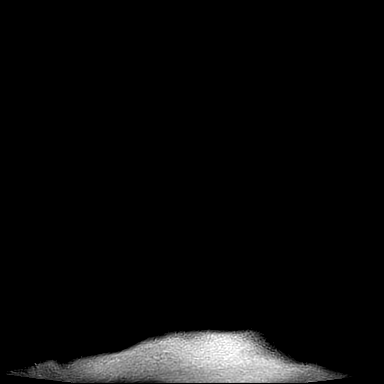

[Series 18: FLAIR · axial · 3.0mm · 1.20mm/px · z∈[-74,+74]mm · 2 of 55 slices shown (2 of 2)]
[im 1/55]
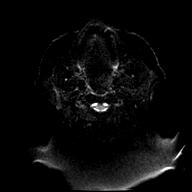
[im 55/55]
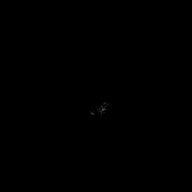

[28 of 28 positions shown; findings below may reference images not displayed]

FINDINGS: Brain: No acute infarction, hemorrhage, hydrocephalus, extra-axial
collection or mass lesion. Mild prominence of the frontal
extra-axial spaces bilaterally, similar to prior. Minimal chronic
microvascular ischemic disease, within normal limits for age.

Vascular: Proximal major arterial flow voids are maintained at the
skull base.

Skull and upper cervical spine: Normal marrow signal.

Sinuses/Orbits: Mild ethmoid air cell mucosal thickening. No
air-fluid levels. Negative orbits.

Other: No mastoid effusions.
IMPRESSION: No acute intracranial abnormality.  Specifically, no acute infarct

## 2021-11-02 ENCOUNTER — Ambulatory Visit (INDEPENDENT_AMBULATORY_CARE_PROVIDER_SITE_OTHER): Payer: Medicare Other

## 2021-11-02 ENCOUNTER — Ambulatory Visit: Payer: Medicare Other | Admitting: Internal Medicine

## 2021-11-02 ENCOUNTER — Ambulatory Visit (INDEPENDENT_AMBULATORY_CARE_PROVIDER_SITE_OTHER): Payer: Medicare Other | Admitting: Internal Medicine

## 2021-11-02 ENCOUNTER — Encounter: Payer: Self-pay | Admitting: Internal Medicine

## 2021-11-02 VITALS — Temp 98.0°F | Resp 14 | Ht 64.0 in | Wt 184.2 lb

## 2021-11-02 DIAGNOSIS — I951 Orthostatic hypotension: Secondary | ICD-10-CM

## 2021-11-02 DIAGNOSIS — J810 Acute pulmonary edema: Secondary | ICD-10-CM

## 2021-11-02 DIAGNOSIS — R42 Dizziness and giddiness: Secondary | ICD-10-CM

## 2021-11-02 DIAGNOSIS — R0602 Shortness of breath: Secondary | ICD-10-CM

## 2021-11-02 DIAGNOSIS — I959 Hypotension, unspecified: Secondary | ICD-10-CM

## 2021-11-02 DIAGNOSIS — J811 Chronic pulmonary edema: Secondary | ICD-10-CM | POA: Diagnosis not present

## 2021-11-02 NOTE — Progress Notes (Signed)
Chief Complaint  Patient presents with   Hypotension    Ongoing x1 wk, pt states getting worse. Readings at home have been around 80/50's has stopped taking amlodipine. Pt c/o constant dizziness,lightheadness and feels like she is going to pass out.    F/u  1. Hypotension to 80s/50s on norvasc 2.5 mg qd and lasix 20 mg x 2 per Dr. Raul Del she has been more lightheaded and dizzy the room is not spinning and she feels she is going to pass out she read this can be side effect of parkinsons medication ongoing x 1 week cta chest neg pe in 09/2021 but + pulmonary edema and Dr. Raul Del changed lasix 20 to 40 mg total for the day   Lying down bp 137/82 hr 76 sitting 129/82 hr 73, standing 104/64 hr 82 +  At times she feels sob  Review of Systems  Constitutional:  Negative for weight loss.  HENT:  Negative for hearing loss.   Eyes:  Negative for blurred vision.  Respiratory:  Positive for shortness of breath.   Cardiovascular:  Negative for chest pain.  Gastrointestinal:  Negative for abdominal pain and blood in stool.  Genitourinary:  Negative for dysuria.  Musculoskeletal:  Negative for back pain, falls and joint pain.  Skin:  Negative for rash.  Neurological:  Positive for dizziness. Negative for headaches.  Psychiatric/Behavioral:  Negative for depression.   Past Medical History:  Diagnosis Date   Anxiety    Asthma    Atypical chest pain    a. 2003 Cath: reportedly nl per pt [name]);  b. 04/2015 [facility], neg troponin.   Colon cancer ([place]) 1999   a. 1999: Pt had partial colectomy. Did develop lung metastasis. Had right upper lobectomy in 01/2007 then had associated chemotherapy.   COPD (chronic obstructive pulmonary disease) (HCC)    DDD (degenerative disc disease), lumbosacral    Depression    Echocardiogram abnormal    a. 02/2010 Echo: EF 55-60%, mild LAE, no regional wall motion abnormalities   Elevated transaminase level    a. ? NASH;  b. 06/2015 - nl LFTs.   GERD  (gastroesophageal reflux disease)    Hematuria    Holter monitor, abnormal    a. 12/2009 Holter monitoring: NSR, rare PACs and PVCs.   Hypercholesterolemia    Hyperlipidemia    Hypertension    Leaky heart valve    a. Per pt report - no evidence of valvular abnormalities on prior echoes.   Lesion of skin of Right breast 10/20/2017   Lung metastases 2008   a. S/P resection (lobectomy - right)   Morbid obesity ([place])    Nephrolithiasis    Parkinson disease ([place]) 08/2016   [name]   Past Surgical History:  Procedure Laterality Date   BREAST EXCISIONAL BIOPSY Right 10/07/2009   benign   CARDIAC CATHETERIZATION  2003   negative as per pt report   CHOLECYSTECTOMY  2000   COLON SURGERY     Colon cancer, removed part of large colon   COLONOSCOPY N/A 04/25/2015   Procedure: COLONOSCOPY;  Surgeon: Manya Silvas, MD;  Location: [facility];  Service: Endoscopy;  Laterality: N/A;   COLONOSCOPY WITH PROPOFOL N/A 06/14/2017   Procedure: COLONOSCOPY WITH PROPOFOL;  Surgeon: Manya Silvas, MD;  Location: [facility] ENDOSCOPY;  Service: Endoscopy;  Laterality: N/A;   COLONOSCOPY WITH PROPOFOL N/A 11/17/2019   Procedure: COLONOSCOPY WITH PROPOFOL;  Surgeon: [name], MD;  Location: [facility] ENDOSCOPY;  Service: Gastroenterology;  Laterality: N/A;  COLONOSCOPY WITH PROPOFOL N/A 03/21/2021   Procedure: COLONOSCOPY WITH PROPOFOL;  Surgeon: Lesly Rubenstein, MD;  Location: [facility] ENDOSCOPY;  Service: Endoscopy;  Laterality: N/A;   ESOPHAGOGASTRODUODENOSCOPY (EGD) WITH PROPOFOL N/A 03/21/2021   Procedure: ESOPHAGOGASTRODUODENOSCOPY (EGD) WITH PROPOFOL;  Surgeon: Lesly Rubenstein, MD;  Location: [facility] ENDOSCOPY;  Service: Endoscopy;  Laterality: N/A;   LOBECTOMY  01/2007   Right lung, upper lobe right side removed   TOTAL ABDOMINAL HYSTERECTOMY     abnormal bleeding   TUBAL LIGATION  1980   Family History  Problem Relation Age of Onset   Coronary artery disease Mother        died @ 18 of MI.    Mental illness Mother    Diabetes Mother    Heart attack Father 78       MI   Cancer Father        lung - died in his mid-70's   Hypertension Father    Diabetes Father    Sudden death Brother    Mental illness Brother    Heart attack Brother        MI @ 46, died @ age 4.   Breast cancer Neg Hx    Social History   Socioeconomic History   Marital status: Widowed    Spouse name: Not on file   Number of children: Not on file   Years of education: Not on file   Highest education level: Not on file  Occupational History   Not on file  Tobacco Use   Smoking status: Never    Passive exposure: Yes   Smokeless tobacco: Never   Tobacco comments:    husband and son smoked in her home.  Vaping Use   Vaping Use: Never used  Substance and Sexual Activity   Alcohol use: Not Currently    Alcohol/week: 0.0 standard drinks   Drug use: No   Sexual activity: Never  Other Topics Concern   Not on file  Social History Narrative   Widowed.   Disabled (colon/lung CA), retired.   Lives in [place] with her dtr, grand-dtr, and son.   Active.   Gets regular exercise, walks.   Social Determinants of Health   Financial Resource Strain: Low Risk    Difficulty of Paying Living Expenses: Not hard at all  Food Insecurity: No Food Insecurity   Worried About Charity fundraiser in the Last Year: Never true   [place] in the Last Year: Never true  Transportation Needs: No Transportation Needs   Lack of Transportation (Medical): No   Lack of Transportation (Non-Medical): No  Physical Activity: Not on file  Stress: No Stress Concern Present   Feeling of Stress : Not at all  Social Connections: Unknown   Frequency of Communication with Friends and Family: More than three times a week   Frequency of Social Gatherings with Friends and Family: More than three times a week   Attends Religious Services: Not on Electrical engineer or Organizations: Not on file   Attends English as a second language teacher Meetings: Not on file   Marital Status: Not on file  Intimate Partner Violence: Not At Risk   Fear of Current or Ex-Partner: No   Emotionally Abused: No   Physically Abused: No   Sexually Abused: No   Current Meds  Medication Sig   ALPRAZolam (XANAX) 0.5 MG tablet TAKE 1/2 TABLET BY MOUTH ONCE DAILY AS NEEDED   amLODipine (NORVASC)  2.5 MG tablet TAKE 1 TABLET BY MOUTH DAILY   aspirin EC 81 MG tablet Take 81 mg by mouth as needed.    carbidopa-levodopa (SINEMET) 25-100 MG tablet Take 1.5 in am and one at 11:00 and one in afternoon and 1.5 at night.   citalopram (CELEXA) 10 MG tablet TAKE ONE AND ONE-HALF TABLET BY MOUTH DAILY   ELIQUIS 2.5 MG TABS tablet Take 2.5 mg by mouth 2 (two) times daily.   fluticasone (FLONASE) 50 MCG/ACT nasal spray Place 2 sprays into both nostrils daily.   fluticasone-salmeterol (ADVAIR HFA) 115-21 MCG/ACT inhaler Inhale 2 puffs into the lungs 2 (two) times daily.   furosemide (LASIX) 20 MG tablet TAKE 1 TABLET BY MOUTH DAILY AS NEEDED   montelukast (SINGULAIR) 10 MG tablet TAKE 1 TABLET BY MOUTH AT BEDTIME   omeprazole (PRILOSEC) 20 MG capsule Take 1 capsule (20 mg total) by mouth daily.   PROAIR HFA 108 (90 Base) MCG/ACT inhaler INHALE 2 PUFFS BY MOUTH INTO THE LUNGS EVERY 6 HOURS AS NEEDED FOR WHEEZING   rosuvastatin (CRESTOR) 5 MG tablet TAKE 1 TABLET BY MOUTH EVERY MONDAY, WEDNESDAY AND FRIDAY   traZODone (DESYREL) 50 MG tablet TAKE 2 TABLETS BY MOUTH AT BEDTIME   vitamin B-12 (CYANOCOBALAMIN) 100 MCG tablet Take 100 mcg by mouth daily.   Allergies  Allergen Reactions   Macrobid [Nitrofurantoin Macrocrystal] Shortness Of Breath and Palpitations   Antihistamines, Diphenhydramine-Type Other (See Comments)    She does not know which antihistamine she has an allergic reaction to.   Aspirin Other (See Comments)    Makes her sick on the stomach if she takes too many and causes bleeding.  Of note, she can take ibuprofen. Other reaction(s):  Other (See Comments) Makes her sick on the stomach.  Of note, she can take ibuprofen. Makes her sick on the stomach.  Of note, she can take ibuprofen.   Ceftin [Cefuroxime Axetil] Other (See Comments)    Chest pains   Celecoxib Other (See Comments)    Reaction: Blood Disorder GI bleeding.   Cymbalta [Duloxetine Hcl] Other (See Comments)    Reaction: unknown   Diphenhydramine Other (See Comments)   Escitalopram Other (See Comments)   Lexapro [Escitalopram Oxalate] Other (See Comments)    Reaction: Difficulty with speech   Metronidazole Other (See Comments)    Other reaction(s): Unknown   Nitrofurantoin Other (See Comments)   Zoloft [Sertraline Hcl]    Ciprofloxacin Other (See Comments)    Reaction: unknown   Recent Results (from the past 2160 hour(s))  Hepatic function panel     Status: None   Collection Time: 08/14/21  1:37 PM  Result Value Ref Range   Total Bilirubin 0.4 0.2 - 1.2 mg/dL   Bilirubin, Direct 0.1 0.0 - 0.3 mg/dL   Alkaline Phosphatase 71 39 - 117 U/L   AST 12 0 - 37 U/L   ALT 3 0 - 35 U/L   Total Protein 6.2 6.0 - 8.3 g/dL   Albumin 3.8 3.5 - 5.2 g/dL  POC COVID-19     Status: None   Collection Time: 09/11/21 12:20 PM  Result Value Ref Range   SARS Coronavirus 2 Ag Negative Negative  POCT Influenza A/B     Status: None   Collection Time: 09/11/21 12:20 PM  Result Value Ref Range   Influenza A, POC Negative Negative   Influenza B, POC Negative Negative  CBC with Differential/Platelet     Status: Abnormal   Collection Time: 09/11/21  2:48 PM  Result Value Ref Range   WBC 9.5 4.0 - 10.5 K/uL   RBC 3.53 (L) 3.87 - 5.11 MIL/uL   Hemoglobin 10.0 (L) 12.0 - 15.0 g/dL   HCT 32.1 (L) 36.0 - 46.0 %   MCV 90.9 80.0 - 100.0 fL   MCH 28.3 26.0 - 34.0 pg   MCHC 31.2 30.0 - 36.0 g/dL   RDW 15.1 11.5 - 15.5 %   Platelets 411 (H) 150 - 400 K/uL   nRBC 0.0 0.0 - 0.2 %   Neutrophils Relative % 77 %   Neutro Abs 7.3 1.7 - 7.7 K/uL   Lymphocytes Relative 16 %    Lymphs Abs 1.5 0.7 - 4.0 K/uL   Monocytes Relative 6 %   Monocytes Absolute 0.6 0.1 - 1.0 K/uL   Eosinophils Relative 1 %   Eosinophils Absolute 0.1 0.0 - 0.5 K/uL   Basophils Relative 0 %   Basophils Absolute 0.0 0.0 - 0.1 K/uL   Immature Granulocytes 0 %   Abs Immature Granulocytes 0.04 0.00 - 0.07 K/uL    Comment: Performed at [facility], [address] [place], [place] [address]  Brain natriuretic peptide     Status: Abnormal   Collection Time: 09/11/21  2:48 PM  Result Value Ref Range   B Natriuretic Peptide 379.3 (H) 0.0 - 100.0 pg/mL    Comment: Performed at [facility], [address] [place], [place] [address]  Creatinine, serum     Status: Abnormal   Collection Time: 09/11/21  2:48 PM  Result Value Ref Range   Creatinine, Ser 1.07 (H) 0.44 - 1.00 mg/dL   GFR, Estimated 56 (L) >60 mL/min    Comment: (NOTE) Calculated using the CKD-EPI Creatinine Equation (2021) Performed at [facility], [address] [place], [place] [address]   IBC + Ferritin     Status: Abnormal   Collection Time: 10/13/21  9:04 AM  Result Value Ref Range   Iron 54 42 - 145 ug/dL   Transferrin 209.0 (L) 212.0 - 360.0 mg/dL   Saturation Ratios 18.5 (L) 20.0 - 50.0 %   Ferritin 68.5 10.0 - 291.0 ng/mL   TIBC 292.6 250.0 - 450.0 mcg/dL  CBC with Differential/Platelet     Status: Abnormal   Collection Time: 10/13/21  9:04 AM  Result Value Ref Range   WBC 6.2 4.0 - 10.5 K/uL   RBC 3.92 3.87 - 5.11 Mil/uL   Hemoglobin 11.3 (L) 12.0 - 15.0 g/dL   HCT 34.5 (L) 36.0 - 46.0 %   MCV 88.2 78.0 - 100.0 fl   MCHC 32.6 30.0 - 36.0 g/dL   RDW 15.1 11.5 - 15.5 %   Platelets 288.0 150.0 - 400.0 K/uL   Neutrophils Relative % 74.0 43.0 - 77.0 %   Lymphocytes Relative 18.8 12.0 - 46.0 %   Monocytes Relative 5.6 3.0 - 12.0 %   Eosinophils Relative 0.9 0.0 - 5.0 %   Basophils Relative 0.7 0.0 - 3.0 %   Neutro Abs 4.6 1.4 - 7.7 K/uL   Lymphs Abs 1.2 0.7 - 4.0 K/uL    Monocytes Absolute 0.3 0.1 - 1.0 K/uL   Eosinophils Absolute 0.1 0.0 - 0.7 K/uL   Basophils Absolute 0.0 0.0 - 0.1 K/uL  Basic Metabolic Panel (BMET)     Status: Abnormal   Collection Time: 10/13/21  9:04 AM  Result Value Ref Range   Sodium 140 135 - 145 mEq/L   Potassium 4.1 3.5 -  5.1 mEq/L   Chloride 104 96 - 112 mEq/L   CO2 27 19 - 32 mEq/L   Glucose, Bld 93 70 - 99 mg/dL   BUN 16 6 - 23 mg/dL   Creatinine, Ser 1.03 0.40 - 1.20 mg/dL   GFR 54.55 (L) >60.00 mL/min    Comment: Calculated using the CKD-EPI Creatinine Equation (2021)   Calcium 9.1 8.4 - 10.5 mg/dL  Hepatic function panel     Status: None   Collection Time: 10/13/21  9:04 AM  Result Value Ref Range   Total Bilirubin 0.5 0.2 - 1.2 mg/dL   Bilirubin, Direct 0.1 0.0 - 0.3 mg/dL   Alkaline Phosphatase 72 39 - 117 U/L   AST 12 0 - 37 U/L   ALT 3 0 - 35 U/L   Total Protein 6.9 6.0 - 8.3 g/dL   Albumin 3.9 3.5 - 5.2 g/dL  Lipid Profile     Status: None   Collection Time: 10/13/21  9:04 AM  Result Value Ref Range   Cholesterol 149 0 - 200 mg/dL    Comment: ATP III Classification       Desirable:  < 200 mg/dL               Borderline High:  200 - 239 mg/dL          High:  > = 240 mg/dL   Triglycerides 100.0 0.0 - 149.0 mg/dL    Comment: Normal:  <150 mg/dLBorderline High:  150 - 199 mg/dL   HDL 57.10 >39.00 mg/dL   VLDL 20.0 0.0 - 40.0 mg/dL   LDL Cholesterol 72 0 - 99 mg/dL   Total CHOL/HDL Ratio 3     Comment:                Men          Women1/2 Average Risk     3.4          3.3Average Risk          5.0          4.42X Average Risk          9.6          7.13X Average Risk          15.0          11.0                       NonHDL 91.95     Comment: NOTE:  Non-HDL goal should be 30 mg/dL higher than patient's LDL goal (i.e. LDL goal of < 70 mg/dL, would have non-HDL goal of < 100 mg/dL)   Objective  Body mass index is 31.62 kg/m. Wt Readings from Last 3 Encounters:  11/02/21 184 lb 3.2 oz (83.6 kg)  09/11/21 199 lb  6.4 oz (90.4 kg)  07/04/21 192 lb 9.6 oz (87.4 kg)   Temp Readings from Last 3 Encounters:  11/02/21 98 F (36.7 C) (Oral)  09/11/21 97.7 F (36.5 C) (Oral)  07/04/21 98.2 F (36.8 C)   BP Readings from Last 3 Encounters:  09/11/21 (!) 150/78  07/04/21 138/78  03/23/21 132/72   Pulse Readings from Last 3 Encounters:  09/11/21 97  07/04/21 66  03/23/21 70    Physical Exam Vitals and nursing note reviewed.  Constitutional:      Appearance: Normal appearance. She is well-developed and well-groomed.  HENT:     Head: Normocephalic and atraumatic.  Eyes:  Conjunctiva/sclera: Conjunctivae normal.     Pupils: Pupils are equal, round, and reactive to light.  Cardiovascular:     Rate and Rhythm: Normal rate and regular rhythm.     Heart sounds: Normal heart sounds. No murmur heard. Pulmonary:     Effort: Pulmonary effort is normal.     Breath sounds: Normal breath sounds.  Abdominal:     General: Abdomen is flat. Bowel sounds are normal.     Tenderness: There is no abdominal tenderness.  Musculoskeletal:        General: No tenderness.  Skin:    General: Skin is warm and dry.  Neurological:     General: No focal deficit present.     Mental Status: She is alert and oriented to person, place, and time. Mental status is at baseline.     Cranial Nerves: Cranial nerves 2-12 are intact.     Motor: Motor function is intact.     Coordination: Coordination is intact.     Gait: Gait is intact.  Psychiatric:        Attention and Perception: Attention and perception normal.        Mood and Affect: Mood and affect normal.        Speech: Speech normal.        Behavior: Behavior normal. Behavior is cooperative.        Thought Content: Thought content normal.        Cognition and Memory: Cognition and memory normal.        Judgment: Judgment normal.    Assessment  Plan  Dizziness - Plan: MR Brain Wo Contrast r/o stroke US Carotid Duplex Bilateral, Ambulatory referral to  Cardiology  Orthostatic hypotension - Plan: MR Brain Wo Contrast, US Carotid Duplex Bilateral, Ambulatory referral to Cardiology Dr. [facility]al[facility]gh Ip at [facility] Stop norvasc 2.5 for now [employer] coppertone compression socks  F/u Dr. Manuella Ghazi 11/20/21   If blood pressure >140/>80 take norvasc 2.5 mg daily otherwise do not take it  Hypotension, unspecified hypotension type - Plan: MR Brain Wo Contrast, US Carotid Duplex Bilateral, Ambulatory referral to Cardiology  SOB (shortness of breath) - Plan: DG Chest 2 View, Ambulatory referral to Cardiology Acute pulmonary edema ([place]) - Plan: Ambulatory referral to Cardiology  She is on lasix 40 mg total per the day per Dr. Raul Del appt f/u 02/27/22 Rec hydrate with water   Provider: Dr. Olivia Mackie McLean-Scocuzza-Internal Medicine

## 2021-11-02 NOTE — Patient Instructions (Addendum)
Stop norvasc 2.5 for now [employer] coppertone compression socks   If blood pressure >140/>80 take norvasc 2.5 mg daily otherwise do not take it  [phone] Not available [facility]   [place] [id number]    11/20/2021 Office Visit Neurology Ray Church, [place]   [facility]   [place], [place] [address]   [phone] (Work)   [phone] (Fax)      02/27/2022 Office Visit Pulmonary Erby Pian, MD   [place]   [place],  [address]   [phone] (Work)   [phone] (Fax)       Dizziness Dizziness is a common problem. It is a feeling of unsteadiness or light-headedness. You may feel like you are about to faint. Dizziness can lead to injury if you stumble or fall. Anyone can become dizzy, but dizziness is more common in older adults. This condition can be caused by a number of things, including medicines, dehydration, or illness. Follow these instructions at home: Eating and drinking  Drink enough fluid to keep your urine pale yellow. This helps to keep you from becoming dehydrated. Try to drink more clear fluids, such as water. Do not drink alcohol. Limit your caffeine intake if told to do so by your health care provider. Check ingredients and nutrition facts to see if a food or beverage contains caffeine. Limit your salt (sodium) intake if told to do so by your health care provider. Check ingredients and nutrition facts to see if a food or beverage contains sodium. Activity  Avoid making quick movements. Rise slowly from chairs and steady yourself until you feel okay. In the morning, first sit up on the side of the bed. When you feel okay, stand slowly while you hold onto something until you know that your balance is good. If you need to stand in one place for a long time, move your legs often. Tighten and relax the  muscles in your legs while you are standing. Do not drive or use machinery if you feel dizzy. Avoid bending down if you feel dizzy. Place items in your home so that they are easy for you to reach without leaning over. Lifestyle Do not use any products that contain nicotine or tobacco. These products include cigarettes, chewing tobacco, and vaping devices, such as e-cigarettes. If you need help quitting, ask your health care provider. Try to reduce your stress level by using methods such as yoga or meditation. Talk with your health care provider if you need help to manage your stress. General instructions Watch your dizziness for any changes. Take over-the-counter and prescription medicines only as told by your health care provider. Talk with your health care provider if you think that your dizziness is caused by a medicine that you are taking. Tell a friend or a family member that you are feeling dizzy. If he or she notices any changes in your behavior, have this person call your health care provider. Keep all follow-up visits. This is important. Contact a health care provider if: Your dizziness does not go away or you have new symptoms. Your dizziness or light-headedness gets worse. You feel nauseous. You have reduced hearing. You have a fever. You have neck pain or a stiff neck. Your dizziness leads to an injury or a fall. Get help right away if: You vomit or have diarrhea and are unable to eat or drink anything.  You have problems talking, walking, swallowing, or using your arms, hands, or legs. You feel generally weak. You have any bleeding. You are not thinking clearly or you have trouble forming sentences. It may take a friend or family member to notice this. You have chest pain, abdominal pain, shortness of breath, or sweating. Your vision changes or you develop a severe headache. These symptoms may represent a serious problem that is an emergency. Do not wait to see if the symptoms  will go away. Get medical help right away. Call your local emergency services (911 in the [place] Do not drive yourself to the hospital. Summary Dizziness is a feeling of unsteadiness or light-headedness. This condition can be caused by a number of things, including medicines, dehydration, or illness. Anyone can become dizzy, but dizziness is more common in older adults. Drink enough fluid to keep your urine pale yellow. Do not drink alcohol. Avoid making quick movements if you feel dizzy. Monitor your dizziness for any changes. This information is not intended to replace advice given to you by your health care provider. Make sure you discuss any questions you have with your health care provider. Document Revised: 05/02/2020 Document Reviewed: 05/02/2020 Elsevier Patient Education  [place].   Orthostatic Hypotension Blood pressure is a measurement of how strongly, or weakly, your circulating blood is pressing against the walls of your arteries. Orthostatic hypotension is a drop in blood pressure that can happen when you change positions, such as when you go from lying down to standing. Arteries are blood vessels that carry blood from your heart throughout your body. When blood pressure is too low, you may not get enough blood to your brain or to the rest of your organs. Orthostatic hypotension can cause light-headedness, sweating, rapid heartbeat, blurred vision, and fainting. These symptoms require further investigation into the cause. What are the causes? Orthostatic hypotension can be caused by many things, including: Sudden changes in posture, such as standing up quickly after you have been sitting or lying down. Loss of blood (anemia) or loss of body fluids (dehydration). Heart problems, neurologic problems, or hormone problems. Pregnancy. Aging. The risk for this condition increases as you get older. Severe infection (sepsis). Certain medicines, such as medicines for high blood  pressure or medicines that make the body lose excess fluids (diuretics). What are the signs or symptoms? Symptoms of this condition may include: Weakness, light-headedness, or dizziness. Sweating. Blurred vision. Tiredness (fatigue). Rapid heartbeat. Fainting, in severe cases. How is this diagnosed? This condition is diagnosed based on: Your symptoms and medical history. Your blood pressure measurements. Your health care provider will check your blood pressure when you are: Lying down. Sitting. Standing. A blood pressure reading is recorded as two numbers, such as "120 over 80" (or 120/80). The first ("top") number is called the systolic pressure. It is a measure of the pressure in your arteries as your heart beats. The second ("bottom") number is called the diastolic pressure. It is a measure of the pressure in your arteries when your heart relaxes between beats. Blood pressure is measured in a unit called mmHg. Healthy blood pressure for most adults is 120/80 mmHg. Orthostatic hypotension is defined as a 20 mmHg drop in systolic pressure or a 10 mmHg drop in diastolic pressure within 3 minutes of standing. Other information or tests that may be used to diagnose orthostatic hypotension include: Your other vital signs, such as your heart rate and temperature. Blood tests. An electrocardiogram (ECG) or echocardiogram. A  Holter monitor. This is a device you wear that records your heart rhythm continuously, usually for 24-48 hours. Tilt table test. For this test, you will be safely secured to a table that moves you from a lying position to an upright position. Your heart rhythm and blood pressure will be monitored during the test. How is this treated? This condition may be treated by: Changing your diet. This may involve eating more salt (sodium) or drinking more water. Changing the dosage of certain medicines you are taking that might be lowering your blood pressure. Correcting the  underlying reason for the orthostatic hypotension. Wearing compression stockings. Taking medicines to raise your blood pressure. Avoiding actions that trigger symptoms. Follow these instructions at home: Medicines Take over-the-counter and prescription medicines only as told by your health care provider. Follow instructions from your health care provider about changing the dosage of your current medicines, if this applies. Do not stop or adjust any of your medicines on your own. Eating and drinking  Drink enough fluid to keep your urine pale yellow. Eat extra salt only as directed. Do not add extra salt to your diet unless advised by your health care provider. Eat frequent, small meals. Avoid standing up suddenly after eating. General instructions  Get up slowly from lying down or sitting positions. This gives your blood pressure a chance to adjust. Avoid hot showers and excessive heat as directed by your health care provider. Engage in regular physical activity as directed by your health care provider. If you have compression stockings, wear them as told. Keep all follow-up visits. This is important. Contact a health care provider if: You have a fever for more than 2-3 days. You feel more thirsty than usual. You feel dizzy or weak. Get help right away if: You have chest pain. You have a fast or irregular heartbeat. You become sweaty or feel light-headed. You feel short of breath. You faint. You have any symptoms of a stroke. "BE FAST" is an easy way to remember the main warning signs of a stroke: B - Balance. Signs are dizziness, sudden trouble walking, or loss of balance. E - Eyes. Signs are trouble seeing or a sudden change in vision. F - Face. Signs are sudden weakness or numbness of the face, or the face or eyelid drooping on one side. A - Arms. Signs are weakness or numbness in an arm. This happens suddenly and usually on one side of the body. S - Speech. Signs are sudden  trouble speaking, slurred speech, or trouble understanding what people say. T - Time. Time to call emergency services. Write down what time symptoms started. You have other signs of a stroke, such as: A sudden, severe headache with no known cause. Nausea or vomiting. Seizure. These symptoms may represent a serious problem that is an emergency. Do not wait to see if the symptoms will go away. Get medical help right away. Call your local emergency services (911 in the [place] Do not drive yourself to the hospital. Summary Orthostatic hypotension is a sudden drop in blood pressure. It can cause light-headedness, sweating, rapid heartbeat, blurred vision, and fainting. Orthostatic hypotension can be diagnosed by having your blood pressure taken while lying down, sitting, and then standing. Treatment may involve changing your diet, wearing compression stockings, sitting up slowly, adjusting your medicines, or correcting the underlying reason for the orthostatic hypotension. Get help right away if you have chest pain, a fast or irregular heartbeat, or symptoms of a stroke. This information is  not intended to replace advice given to you by your health care provider. Make sure you discuss any questions you have with your health care provider. Document Revised: 08/11/2020 Document Reviewed: 08/11/2020 Elsevier Patient Education  [place].

## 2021-11-03 ENCOUNTER — Ambulatory Visit: Admission: RE | Admit: 2021-11-03 | Payer: Medicare Other | Source: Ambulatory Visit

## 2021-11-05 ENCOUNTER — Emergency Department: Payer: Medicare Other

## 2021-11-05 ENCOUNTER — Encounter: Payer: Self-pay | Admitting: Intensive Care

## 2021-11-05 ENCOUNTER — Emergency Department
Admission: EM | Admit: 2021-11-05 | Discharge: 2021-11-05 | Disposition: A | Discharge status: Home / Self Care | Payer: Medicare Other | Attending: Emergency Medicine | Admitting: Emergency Medicine

## 2021-11-05 ENCOUNTER — Other Ambulatory Visit: Payer: Self-pay

## 2021-11-05 DIAGNOSIS — I959 Hypotension, unspecified: Secondary | ICD-10-CM | POA: Diagnosis not present

## 2021-11-05 DIAGNOSIS — I9589 Other hypotension: Secondary | ICD-10-CM | POA: Insufficient documentation

## 2021-11-05 DIAGNOSIS — R42 Dizziness and giddiness: Secondary | ICD-10-CM | POA: Diagnosis not present

## 2021-11-05 DIAGNOSIS — I10 Essential (primary) hypertension: Secondary | ICD-10-CM | POA: Insufficient documentation

## 2021-11-05 DIAGNOSIS — G20 Parkinson's disease: Secondary | ICD-10-CM | POA: Insufficient documentation

## 2021-11-05 DIAGNOSIS — R531 Weakness: Secondary | ICD-10-CM | POA: Insufficient documentation

## 2021-11-05 LAB — DIFFERENTIAL
Abs Immature Granulocytes: 0.02 10*3/uL (ref 0.00–0.07)
Basophils Absolute: 0.1 10*3/uL (ref 0.0–0.1)
Basophils Relative: 1 %
Eosinophils Absolute: 0.1 10*3/uL (ref 0.0–0.5)
Eosinophils Relative: 1 %
Immature Granulocytes: 0 %
Lymphocytes Relative: 27 %
Lymphs Abs: 2.1 10*3/uL (ref 0.7–4.0)
Monocytes Absolute: 0.5 10*3/uL (ref 0.1–1.0)
Monocytes Relative: 7 %
Neutro Abs: 5 10*3/uL (ref 1.7–7.7)
Neutrophils Relative %: 64 %

## 2021-11-05 LAB — COMPREHENSIVE METABOLIC PANEL
ALT: 8 U/L (ref 0–44)
AST: 16 U/L (ref 15–41)
Albumin: 3.9 g/dL (ref 3.5–5.0)
Alkaline Phosphatase: 79 U/L (ref 38–126)
Anion gap: 6 (ref 5–15)
BUN: 21 mg/dL (ref 8–23)
CO2: 26 mmol/L (ref 22–32)
Calcium: 9.3 mg/dL (ref 8.9–10.3)
Chloride: 106 mmol/L (ref 98–111)
Creatinine, Ser: 0.88 mg/dL (ref 0.44–1.00)
GFR, Estimated: >60 mL/min (ref >60)
Glucose, Bld: 108 mg/dL — ABNORMAL HIGH (ref 70–99)
Potassium: 4 mmol/L (ref 3.5–5.1)
Sodium: 138 mmol/L (ref 135–145)
Total Bilirubin: 0.4 mg/dL (ref 0.3–1.2)
Total Protein: 7.4 g/dL (ref 6.5–8.1)

## 2021-11-05 LAB — URINALYSIS, ROUTINE W REFLEX MICROSCOPIC
Bacteria, UA: NONE SEEN
Bilirubin Urine: NEGATIVE
Glucose, UA: NEGATIVE mg/dL
Ketones, ur: NEGATIVE mg/dL
Nitrite: NEGATIVE
Protein, ur: NEGATIVE mg/dL
Specific Gravity, Urine: 1.004 — ABNORMAL LOW (ref 1.005–1.030)
pH: 5 (ref 5.0–8.0)

## 2021-11-05 LAB — CBC
HCT: 37.9 % (ref 36.0–46.0)
Hemoglobin: 11.9 g/dL — ABNORMAL LOW (ref 12.0–15.0)
MCH: 28.4 pg (ref 26.0–34.0)
MCHC: 31.4 g/dL (ref 30.0–36.0)
MCV: 90.5 fL (ref 80.0–100.0)
Platelets: 283 10*3/uL (ref 150–400)
RBC: 4.19 MIL/uL (ref 3.87–5.11)
RDW: 13.2 % (ref 11.5–15.5)
WBC: 7.7 10*3/uL (ref 4.0–10.5)
nRBC: 0 % (ref 0.0–0.2)

## 2021-11-05 NOTE — ED Notes (Signed)
Pt transported to MRI 

## 2021-11-05 NOTE — ED Notes (Signed)
Pt transported to CT ?

## 2021-11-05 NOTE — Discharge Instructions (Addendum)
Please discontinue taking your Norvasc (amlodipine) Please start taking your Sinemet as 1 pill in the morning and at 11:00 as well as 1-1/2 pill in the afternoon at night

## 2021-11-05 NOTE — ED Triage Notes (Signed)
Patient c/o dizziness, shuffling feet, and weakness.

## 2021-11-05 NOTE — ED Provider Notes (Signed)
[facility] Provider Note   Event Date/Time   First MD Initiated Contact with Patient 11/05/21 0801     (approximate) History  Weakness and Dizziness  HPI Victoria Hanson is a 71 y.o. female with a stated past medical history of Parkinson's disease and hypertension who presents for 2 weeks of generalized weakness.  Patient describes episodes where she feels like she "runs out of energy".  Patient is very concerned that this is coming from her blood pressure effects from either her Parkinson medication or her oral antihypertensive medicines as she states she has had blood pressure readings that have been down in the "70s/40s" when she has felt this generalized weakness at home and taking her blood pressure.  Patient denies talking to her primary care physician about changes in her medication regimen as she states it is the weekend and she cannot get a hold of them.  Patient currently denies any vision changes, tinnitus, difficulty speaking, facial droop, sore throat, chest pain, shortness of breath, abdominal pain, nausea/vomiting/diarrhea, dysuria, or numbness/paresthesias in any extremity Physical Exam  Triage Vital Signs: ED Triage Vitals  Enc Vitals Group     BP 11/05/21 0726 (!) 190/74     Pulse Rate 11/05/21 0726 65     Resp 11/05/21 0726 18     Temp 11/05/21 0726 97.6 F (36.4 C)     Temp Source 11/05/21 0726 Oral     SpO2 11/05/21 0726 99 %     Weight 11/05/21 0737 184 lb (83.5 kg)     Height 11/05/21 0737 5\' 4"  (1.626 m)     Head Circumference --      Peak Flow --      Pain Score 11/05/21 0734 3     Pain Loc --      Pain Edu? --      Excl. in [place]? --    Most recent vital signs: Vitals:   11/05/21 0930 11/05/21 1000  BP: 127/78 (!) 143/70  Pulse:    Resp:    Temp:    SpO2:     General: Awake, oriented x4. CV:  Good peripheral perfusion.  Resp:  Normal effort.  Abd:  No distention.  Other:  NIHSS 0 ED Results / Procedures / Treatments   Labs (all labs ordered are listed, but only abnormal results are displayed) Labs Reviewed  CBC - Abnormal; Notable for the following components:      Result Value   Hemoglobin 11.9 (*)    All other components within normal limits  COMPREHENSIVE METABOLIC PANEL - Abnormal; Notable for the following components:   Glucose, Bld 108 (*)    All other components within normal limits  URINALYSIS, ROUTINE W REFLEX MICROSCOPIC - Abnormal; Notable for the following components:   Color, Urine STRAW (*)    APPearance CLEAR (*)    Specific Gravity, Urine 1.004 (*)    Hgb urine dipstick SMALL (*)    Leukocytes,Ua SMALL (*)    All other components within normal limits  DIFFERENTIAL  CBG MONITORING, ED   EKG ED ECG REPORT I, Naaman Plummer, the attending physician, personally viewed and interpreted this ECG. Date: 11/05/2021 EKG Time: 0722 Rate: 65 Rhythm: normal sinus rhythm QRS Axis: normal Intervals: normal ST/T Wave abnormalities: normal Narrative Interpretation: no evidence of acute ischemia RADIOLOGY ED MD interpretation: CT of the head without contrast interpreted by me shows no evidence of acute abnormalities including no intracerebral hemorrhage, obvious masses, or significant edema  MRI of the brain with neuro protocol and without contrast shows age normal brain MRI -Agree with radiology assessment Official radiology report(s): CT HEAD WO CONTRAST  Result Date: 11/05/2021 CLINICAL DATA:  Dizziness. Nonspecific. EXAM: CT HEAD WITHOUT CONTRAST TECHNIQUE: Contiguous axial images were obtained from the base of the skull through the vertex without intravenous contrast. RADIATION DOSE REDUCTION: This exam was performed according to the departmental dose-optimization program which includes automated exposure control, adjustment of the mA and/or kV according to patient size and/or use of iterative reconstruction technique. COMPARISON:  CT head without contrast 09/19/2020 FINDINGS: Brain: No  acute infarct, hemorrhage, or mass lesion is present. The ventricles are of normal size. No significant extraaxial fluid collection is present. No significant white matter lesions are present. The brainstem and cerebellum are within normal limits. Vascular: No hyperdense vessel or unexpected calcification. Skull: Calvarium is intact. No focal lytic or blastic lesions are present. No significant extracranial soft tissue lesion is present. Sinuses/Orbits: The paranasal sinuses and mastoid air cells are clear. The globes and orbits are within normal limits. IMPRESSION: Negative CT of the head. Electronically Signed   By: San Morelle M.D.   On: 11/05/2021 08:17   MR Brain Wo Contrast (neuro protocol)  Result Date: 11/05/2021 CLINICAL DATA:  Dizziness, shuffling feet, and weakness. EXAM: MRI HEAD WITHOUT CONTRAST TECHNIQUE: Multiplanar, multiecho pulse sequences of the brain and surrounding structures were obtained without intravenous contrast. COMPARISON:  Head CT from earlier today FINDINGS: Brain: No acute infarction, hemorrhage, hydrocephalus, extra-axial collection or mass lesion. Preserved brain volume. Age normal appearance of the white matter. Vascular: Normal flow voids. Skull and upper cervical spine: Normal marrow signal. Sinuses/Orbits: Negative. IMPRESSION: Age normal brain MRI. Electronically Signed   By: Jorje Guild M.D.   On: 11/05/2021 10:47   PROCEDURES: Critical Care performed: No .1-3 Lead EKG Interpretation Performed by: Naaman Plummer, MD Authorized by: Naaman Plummer, MD     Interpretation: normal     ECG rate:  66   ECG rate assessment: normal     Rhythm: sinus rhythm     Ectopy: none     Conduction: normal   MEDICATIONS ORDERED IN ED: Medications - No data to display IMPRESSION / MDM / [place] / ED COURSE  I reviewed the triage vital signs and the nursing notes.                             The patient is on the cardiac monitor to evaluate for  evidence of arrhythmia and/or significant heart rate changes. Patient presents with complaints of generalized weakness/presyncope ED Workup:  CBC, BMP, Troponin, BNP, ECG, CXR, head CT, MRI Differential diagnosis includes HF, ICH, seizure, stroke, HOCM, ACS, aortic dissection, malignant arrhythmia, or GI bleed. Findings: No evidence of acute laboratory abnormalities.  Troponin negative x1 EKG: No e/o STEMI. No evidence of Brugadas sign, delta wave, epsilon wave, significantly prolonged QTc, or malignant arrhythmia.  Disposition: Discharge. Patient is at baseline at this time. Return precautions expressed and understood in person. Advised follow up with primary care provider or clinic physician in next 24 hours.    FINAL CLINICAL IMPRESSION(S) / ED DIAGNOSES   Final diagnoses:  Generalized weakness  Transient hypotension   Rx / DC Orders   ED Discharge Orders     None      Note:  This document was prepared using Dragon voice recognition software and may include unintentional  dictation errors.   Naaman Plummer, MD 11/05/21 [phone]

## 2021-11-07 ENCOUNTER — Encounter: Payer: Self-pay | Admitting: Internal Medicine

## 2021-11-07 ENCOUNTER — Ambulatory Visit (INDEPENDENT_AMBULATORY_CARE_PROVIDER_SITE_OTHER): Payer: Medicare Other | Admitting: Internal Medicine

## 2021-11-07 VITALS — BP 110/70 | HR 71 | Temp 98.0°F | Ht 64.0 in | Wt 184.0 lb

## 2021-11-07 DIAGNOSIS — G20 Parkinson's disease: Secondary | ICD-10-CM

## 2021-11-07 DIAGNOSIS — I951 Orthostatic hypotension: Secondary | ICD-10-CM | POA: Diagnosis not present

## 2021-11-07 DIAGNOSIS — R42 Dizziness and giddiness: Secondary | ICD-10-CM | POA: Diagnosis not present

## 2021-11-07 MED ORDER — PREDNISONE 10 MG PO TABS
ORAL_TABLET | ORAL | 0 refills | Status: DC
Start: 2021-11-07 — End: 2021-11-24

## 2021-11-07 NOTE — Assessment & Plan Note (Signed)
Suggested by orthostasis demonstrated today despite stopping amlodipine. Reduce furosemide to once daily,  PT referral in progress . Advised to start wearing compression stockings.  May need midodrine if persistent.

## 2021-11-07 NOTE — Assessment & Plan Note (Signed)
APPEARS to be multifactorial:  1) orthostasis 2) inner ear problems  3) Parkinson's disease.  I have advised her to reduce furosemide use back to once daily (she refuses to stop taking it due to fear of dying )  2) prednisone taper for the Eustachian tube dysfunction (request for another round of abx denied as she has nothing on exam to suggest otitis or sinusitis)  And 3) PT referral for PD

## 2021-11-07 NOTE — Patient Instructions (Signed)
I recommend that you reduce the furosemide to once daily.  It may be causing your blood pressure to drop too much when you change position   Your Parkinson's disease can also cause this feeling   I am recommending physical therapy to help manage your balance problems.    I am prescribing prednisone to treat the feeling in your ears.  You do NOT have an ear infection

## 2021-11-07 NOTE — Progress Notes (Signed)
Subjective:  Patient ID: Victoria Hanson, female    DOB: 12/09/1950  Age: 71 y.o. MRN: [id number]  CC: The primary encounter diagnosis was Parkinson's disease ([place]). Diagnoses of Orthostatic hypotension and Dizziness were also pertinent to this visit.   HPI Victoria Hanson presents for  Chief Complaint  Patient presents with   Acute Visit    Right ear pain, left ear feels full. Pt thinks this could be causing her dizziness.   71 yr old female with history of lung ca s/p right lobectomy in 2008,  colon ca /p colonic resection, last colonoscpy 2015 , COPD secondary to remote tobacco,    Parkinson's Disease on Sinemet, hypertension,  presents with ongoign dizziness without vertigo and new onset bilateral ear complaints.  She was noted to have a right sided pneumonia and bilateral pleural effusions on CT chest done April 6 , was treated with augmentin and followed up with pulmonology in early May Raul Del).    Seen by pulmonology May 6  and Dr Raul Del notes that the pneumonia  had resolved (she was treated with augmentin) but advised her to  continue lasix q 12 hours (previously on chronic once daily lasix by PCP for "years"   Seen by [facility] on May 25 for dizziness and hypotension : amlodipine was  stopped, MRI brain ordered.    Seen in Urgent Care May 28 Head CT and MRI brain negative for mass/CVA.  Labs reviewed  cr had improved  and not anemic.  The physician advised her to stay off the amlodipine and  take Sinemet differently:  one at 7 am ,  one a 11 am .  1.5 at 3 pm and 1.5 at bedtime . She feels that this has slightly diminished the dizziness.   Home BP readings have been  high in the morning but drop when she takes sinemet and amlodipine at the same time.  She has not taken amlodipine since May 26 .   This morning's readings without the amlodipine was "high" but in the office today 110/70  She reports ear pain on the right and a feeling of fluid in the left ear.   Has had  sinus  congestion that has been present for several days.  She hstates that the ears "always getter better when she is prescribed augmentin )  but then states that the ears were not bothering her when she saw Dr [facility] on May 25   She also states that she continues to have "Real bad" dizziness.  Denies true vertigo,  Feels off balance ,  trouble walking,  and speech gets slurred. "Feels funny in my head." Even if just sitting on the bed and walking .  Not wearing compression stockings yet.  Historr of left TKR , let knee becomes swollen and warm periodically  Sees Raul Del tomorrow.    Outpatient Medications Prior to Visit  Medication Sig Dispense Refill   ALPRAZolam (XANAX) 0.5 MG tablet TAKE 1/2 TABLET BY MOUTH ONCE DAILY AS NEEDED 30 tablet 0   aspirin EC 81 MG tablet Take 81 mg by mouth as needed.      calcium-vitamin D (OSCAL WITH D) 500-200 MG-UNIT tablet Take 1 tablet by mouth.     carbidopa-levodopa (SINEMET) 25-100 MG tablet Take 1.5 in am and one at 11:00 and one in afternoon and 1.5 at night. 150 tablet 0   citalopram (CELEXA) 10 MG tablet TAKE ONE AND ONE-HALF TABLET BY MOUTH DAILY 90 tablet 1   diclofenac  Sodium (VOLTAREN) 1 % GEL Apply topically 4 (four) times daily.     ELIQUIS 2.5 MG TABS tablet Take 2.5 mg by mouth 2 (two) times daily.     fluticasone-salmeterol (ADVAIR HFA) 115-21 MCG/ACT inhaler Inhale 2 puffs into the lungs 2 (two) times daily. 1 Inhaler 4   furosemide (LASIX) 20 MG tablet TAKE 1 TABLET BY MOUTH DAILY AS NEEDED 30 tablet 2   montelukast (SINGULAIR) 10 MG tablet TAKE 1 TABLET BY MOUTH AT BEDTIME 30 tablet 2   omeprazole (PRILOSEC) 20 MG capsule Take 1 capsule (20 mg total) by mouth daily. 90 capsule 1   PROAIR HFA 108 (90 Base) MCG/ACT inhaler INHALE 2 PUFFS BY MOUTH INTO THE LUNGS EVERY 6 HOURS AS NEEDED FOR WHEEZING 8.5 g 2   rosuvastatin (CRESTOR) 5 MG tablet TAKE 1 TABLET BY MOUTH EVERY MONDAY, WEDNESDAY AND FRIDAY 90 tablet 1   traMADol (ULTRAM) 50 MG tablet Take  50 mg by mouth every 6 (six) hours as needed.     traZODone (DESYREL) 50 MG tablet TAKE 2 TABLETS BY MOUTH AT BEDTIME 180 tablet 1   vitamin B-12 (CYANOCOBALAMIN) 100 MCG tablet Take 100 mcg by mouth daily.     amLODipine (NORVASC) 2.5 MG tablet TAKE 1 TABLET BY MOUTH DAILY 90 tablet 1   fluticasone (FLONASE) 50 MCG/ACT nasal spray Place 2 sprays into both nostrils daily. 11.1 mL 0   No facility-administered medications prior to visit.    Review of Systems;  Patient denies headache, fevers, malaise, unintentional weight loss, skin rash, eye pain, sinus congestion and sinus pain, sore throat, dysphagia,  hemoptysis , cough, dyspnea, wheezing, chest pain, palpitations, orthopnea, edema, abdominal pain, nausea, melena, diarrhea, constipation, flank pain, dysuria, hematuria, urinary  Frequency, nocturia, numbness, tingling, seizures,  Focal weakness, Loss of consciousness,  Tremor, insomnia, depression, anxiety, and suicidal ideation.      Objective:  BP 110/70 (BP Location: Left Arm, Patient Position: Sitting, Cuff Size: Large)   Pulse 71   Temp 98 F (36.7 C) (Oral)   Ht 5\' 4"  (1.626 m)   Wt 184 lb (83.5 kg)   SpO2 98%   BMI 31.58 kg/m   BP Readings from Last 3 Encounters:  11/07/21 110/70  11/05/21 (!) 143/70  09/11/21 (!) 150/78    Wt Readings from Last 3 Encounters:  11/07/21 184 lb (83.5 kg)  11/05/21 184 lb (83.5 kg)  11/02/21 184 lb 3.2 oz (83.6 kg)    General appearance: alert, cooperative and appears stated age Ears: normal TM's and external ear canals both ears Throat: lips, mucosa, and tongue normal; teeth and gums normal Neck: no adenopathy, no carotid bruit, supple, symmetrical, trachea midline and thyroid not enlarged, symmetric, no tenderness/mass/nodules Back: symmetric, no curvature. ROM normal. No CVA tenderness. Lungs: clear to auscultation bilaterally Heart: regular rate and rhythm, S1, S2 normal, no murmur, click, rub or gallop Abdomen: soft, non-tender;  bowel sounds normal; no masses,  no organomegaly Pulses: 2+ and symmetric Skin: Skin color, texture, turgor normal. No rashes or lesions Lymph nodes: Cervical, supraclavicular, and axillary nodes normal. Neuro: CNs 2-12 intact. DTRs 2+/4 in biceps, brachioradialis, patellars and achilles. Muscle strength 5/5 in upper and lower exremities. Fine resting tremor bilaterally both hands cerebellar function normal.  NO NYSTAGMUS,  Romberg negative.  No pronator drift.   Gait antalgic  but not ataxic. .    Lab Results  Component Value Date   HGBA1C 5.8 07/04/2021   HGBA1C 6.1 07/07/2020   HGBA1C 5.9  10/07/2019    Lab Results  Component Value Date   CREATININE 0.88 11/05/2021   CREATININE 1.03 10/13/2021   CREATININE 1.07 (H) 09/11/2021    Lab Results  Component Value Date   WBC 7.7 11/05/2021   HGB 11.9 (L) 11/05/2021   HCT 37.9 11/05/2021   PLT 283 11/05/2021   GLUCOSE 108 (H) 11/05/2021   CHOL 149 10/13/2021   TRIG 100.0 10/13/2021   HDL 57.10 10/13/2021   LDLDIRECT 163.4 12/31/2012   LDLCALC 72 10/13/2021   ALT 8 11/05/2021   AST 16 11/05/2021   NA 138 11/05/2021   K 4.0 11/05/2021   CL 106 11/05/2021   CREATININE 0.88 11/05/2021   BUN 21 11/05/2021   CO2 26 11/05/2021   TSH 1.41 07/04/2021   INR 1.0 12/12/2012   HGBA1C 5.8 07/04/2021    CT HEAD WO CONTRAST  Result Date: 11/05/2021 CLINICAL DATA:  Dizziness. Nonspecific. EXAM: CT HEAD WITHOUT CONTRAST TECHNIQUE: Contiguous axial images were obtained from the base of the skull through the vertex without intravenous contrast. RADIATION DOSE REDUCTION: This exam was performed according to the departmental dose-optimization program which includes automated exposure control, adjustment of the mA and/or kV according to patient size and/or use of iterative reconstruction technique. COMPARISON:  CT head without contrast 09/19/2020 FINDINGS: Brain: No acute infarct, hemorrhage, or mass lesion is present. The ventricles are of normal  size. No significant extraaxial fluid collection is present. No significant white matter lesions are present. The brainstem and cerebellum are within normal limits. Vascular: No hyperdense vessel or unexpected calcification. Skull: Calvarium is intact. No focal lytic or blastic lesions are present. No significant extracranial soft tissue lesion is present. Sinuses/Orbits: The paranasal sinuses and mastoid air cells are clear. The globes and orbits are within normal limits. IMPRESSION: Negative CT of the head. Electronically Signed   By: San Morelle M.D.   On: 11/05/2021 08:17   MR Brain Wo Contrast (neuro protocol)  Result Date: 11/05/2021 CLINICAL DATA:  Dizziness, shuffling feet, and weakness. EXAM: MRI HEAD WITHOUT CONTRAST TECHNIQUE: Multiplanar, multiecho pulse sequences of the brain and surrounding structures were obtained without intravenous contrast. COMPARISON:  Head CT from earlier today FINDINGS: Brain: No acute infarction, hemorrhage, hydrocephalus, extra-axial collection or mass lesion. Preserved brain volume. Age normal appearance of the white matter. Vascular: Normal flow voids. Skull and upper cervical spine: Normal marrow signal. Sinuses/Orbits: Negative. IMPRESSION: Age normal brain MRI. Electronically Signed   By: Jorje Guild M.D.   On: 11/05/2021 10:47    Assessment & Plan:   Problem List Items Addressed This Visit     Dizziness    APPEARS to be multifactorial:  1) orthostasis 2) inner ear problems  3) Parkinson's disease.  I have advised her to reduce furosemide use back to once daily (she refuses to stop taking it due to fear of dying )  2) prednisone taper for the Eustachian tube dysfunction (request for another round of abx denied as she has nothing on exam to suggest otitis or sinusitis)  And 3) PT referral for PD       Orthostatic hypotension due to Parkinson's disease ([place]) - Primary    Suggested by orthostasis demonstrated today despite stopping amlodipine.  Reduce furosemide to once daily,  PT referral in progress . Advised to start wearing compression stockings.  May need midodrine if persistent.        Other Visit Diagnoses     Orthostatic hypotension       Relevant Orders  Ambulatory referral to Physical Therapy       I spent a total of 48  minutes with this patient in a face to face visit on the date of this encounter reviewing the last office visit with Dr Aundra Dubin on May 25,  last visit with pulmonologist on May 6,  recent Urgent Care visit on May 28,   the CT head,  CT chest,  and MRI Brain all done within the last 60 days,  home blood pressure readings ,  orthostatics done today, and post visit ordering of testing and therapeutics.    Follow-up: No follow-ups on file.   Crecencio Mc, MD

## 2021-11-08 ENCOUNTER — Other Ambulatory Visit: Payer: Self-pay | Admitting: Student

## 2021-11-08 DIAGNOSIS — R42 Dizziness and giddiness: Secondary | ICD-10-CM | POA: Diagnosis not present

## 2021-11-08 DIAGNOSIS — G20 Parkinson's disease: Secondary | ICD-10-CM

## 2021-11-08 DIAGNOSIS — G4700 Insomnia, unspecified: Secondary | ICD-10-CM | POA: Diagnosis not present

## 2021-11-08 DIAGNOSIS — R413 Other amnesia: Secondary | ICD-10-CM | POA: Diagnosis not present

## 2021-11-15 DIAGNOSIS — M1711 Unilateral primary osteoarthritis, right knee: Secondary | ICD-10-CM | POA: Diagnosis not present

## 2021-11-20 ENCOUNTER — Emergency Department
Admission: EM | Admit: 2021-11-20 | Discharge: 2021-11-20 | Discharge status: Against Medical Advice | Payer: Medicare Other | Attending: Emergency Medicine | Admitting: Emergency Medicine

## 2021-11-20 ENCOUNTER — Telehealth: Payer: Self-pay | Admitting: Interventional Cardiology

## 2021-11-20 ENCOUNTER — Encounter: Payer: Self-pay | Admitting: Emergency Medicine

## 2021-11-20 ENCOUNTER — Emergency Department: Payer: Medicare Other

## 2021-11-20 ENCOUNTER — Ambulatory Visit: Payer: Medicare Other

## 2021-11-20 DIAGNOSIS — Z5321 Procedure and treatment not carried out due to patient leaving prior to being seen by health care provider: Secondary | ICD-10-CM | POA: Insufficient documentation

## 2021-11-20 DIAGNOSIS — R001 Bradycardia, unspecified: Secondary | ICD-10-CM | POA: Insufficient documentation

## 2021-11-20 DIAGNOSIS — I10 Essential (primary) hypertension: Secondary | ICD-10-CM | POA: Diagnosis not present

## 2021-11-20 DIAGNOSIS — R0602 Shortness of breath: Secondary | ICD-10-CM | POA: Diagnosis not present

## 2021-11-20 LAB — BASIC METABOLIC PANEL
Anion gap: 5 (ref 5–15)
BUN: 23 mg/dL (ref 8–23)
CO2: 27 mmol/L (ref 22–32)
Calcium: 9.2 mg/dL (ref 8.9–10.3)
Chloride: 107 mmol/L (ref 98–111)
Creatinine, Ser: 0.9 mg/dL (ref 0.44–1.00)
GFR, Estimated: >60 mL/min (ref >60)
Glucose, Bld: 109 mg/dL — ABNORMAL HIGH (ref 70–99)
Potassium: 4.1 mmol/L (ref 3.5–5.1)
Sodium: 139 mmol/L (ref 135–145)

## 2021-11-20 LAB — TROPONIN I (HIGH SENSITIVITY): Troponin I (High Sensitivity): 14 ng/L (ref <18)

## 2021-11-20 LAB — CBC
HCT: 35.8 % — ABNORMAL LOW (ref 36.0–46.0)
Hemoglobin: 11 g/dL — ABNORMAL LOW (ref 12.0–15.0)
MCH: 27.5 pg (ref 26.0–34.0)
MCHC: 30.7 g/dL (ref 30.0–36.0)
MCV: 89.5 fL (ref 80.0–100.0)
Platelets: 269 10*3/uL (ref 150–400)
RBC: 4 MIL/uL (ref 3.87–5.11)
RDW: 13.5 % (ref 11.5–15.5)
WBC: 13.5 10*3/uL — ABNORMAL HIGH (ref 4.0–10.5)
nRBC: 0 % (ref 0.0–0.2)

## 2021-11-20 NOTE — Telephone Encounter (Signed)
Called pt in regards to elevated BP of 230/100.  Pt reports BP has been elevated for several days (1 week).  Pt rechecked BP around 9:30 am 120's/70's HR 70's.   Pt reports had an ED visit on 11/05/21 amlodipine was stopped d/t interaction with Parkinson's medication which causes decreased BP.  Pt also stopped taking furosemide in May.  Pt had left knee surgery in March 2023.  Has increased pain and left leg swelling since surgery.   Pt noticeably SOB on phone pt reports this is d/t asthma.  Pt reports intermittent temporal HA and occasional blurry vision and intermittent chest flutters.  Denies dizziness and nose bleeds.   Pt has a history of panic attacks reports took PRN alprazolam as ordered today and BP later decreased to 120's/70's.  Advised pt to call 911 if she has any further elevated BP readings.  Explained to pt that this places her at increased risk of having a stroke.  DOD OV scheduled for 11/22/21 at 10:30 am.  Pt reports would like to switch to Dr. Harl Bowie in [place] d/t location.  Advised pt that I will send request to Dr. Irish Lack and Dr. Harl Bowie to determine if switch is appropriate.

## 2021-11-20 NOTE — ED Triage Notes (Signed)
Pt presents via POV with complaints of HTN over the last month that has caused some SOB today. She notes her PCP stopping her BP & diuretic meds due to it causing bradycardia. She notes her BP at home was 210/100. Denies numbness, headaches, blurred vision, or CP.

## 2021-11-20 NOTE — Telephone Encounter (Signed)
Pt is requesting a provider switch due to [place] office being closer to her. Please advise

## 2021-11-20 NOTE — Telephone Encounter (Signed)
Pt c/o BP issue: STAT if pt c/o blurred vision, one-sided weakness or slurred speech  1. What are your last 5 BP readings?  230/100 - Today ( 5am)  2. Are you having any other symptoms (ex. Dizziness, headache, blurred vision, passed out)? NO  3. What is your BP issue? Pt states that BP has been high since being taken of BP meds due to interaction with another medication for a condition that she has. Please advise

## 2021-11-21 ENCOUNTER — Emergency Department
Admission: EM | Admit: 2021-11-21 | Discharge: 2021-11-21 | Disposition: A | Discharge status: Home / Self Care | Payer: Medicare Other | Attending: Emergency Medicine | Admitting: Emergency Medicine

## 2021-11-21 ENCOUNTER — Other Ambulatory Visit: Payer: Self-pay

## 2021-11-21 ENCOUNTER — Encounter: Payer: Self-pay | Admitting: Emergency Medicine

## 2021-11-21 DIAGNOSIS — I10 Essential (primary) hypertension: Secondary | ICD-10-CM | POA: Insufficient documentation

## 2021-11-21 NOTE — ED Triage Notes (Signed)
Patient ambulatory to triage with steady gait, without difficulty or distress noted; pt here last night for elevated BP but left due wait time; pt st she is returning for BP 200/118 at home; denies any symptoms

## 2021-11-21 NOTE — ED Provider Notes (Signed)
[facility] Provider Note    None    (approximate)   History   Hypertension   HPI  Victoria Hanson is a 71 y.o. female who presents to the ED for evaluation of Hypertension   Patient presents to the ED because her blood pressure was high at home.  She reports she got up to void in the middle of the night as she typically does, but as she was headed back to the bed she checked her blood pressure because "sometimes it gets high."  She reports noting systolic of greater than 790X so she presents to the ED.  She reports that she is asymptomatic and has been asymptomatic.  Reports that due to her parkinsonism her antihypertensives were stopped recently and she has significant consternation that her blood pressure must be too high because of this.  Physical Exam   Triage Vital Signs: ED Triage Vitals  Enc Vitals Group     BP 11/21/21 0529 (!) 152/73     Pulse Rate 11/21/21 0529 67     Resp 11/21/21 0529 18     Temp 11/21/21 0529 98 F (36.7 C)     Temp Source 11/21/21 0529 Oral     SpO2 11/21/21 0529 99 %     Weight 11/21/21 0528 188 lb (85.3 kg)     Height 11/21/21 0528 5\' 4"  (1.626 m)     Head Circumference --      Peak Flow --      Pain Score --      Pain Loc --      Pain Edu? --      Excl. in [place]? --     Most recent vital signs: Vitals:   11/21/21 0529  BP: (!) 152/73  Pulse: 67  Resp: 18  Temp: 98 F (36.7 C)  SpO2: 99%    General: Awake, no distress.  Ambulatory with normal gait.  Pleasant and conversational.  Well-appearing. CV:  Good peripheral perfusion.  Resp:  Normal effort.  Abd:  No distention.  MSK:  No deformity noted.  Neuro:  No focal deficits appreciated. Cranial nerves II through XII intact 5/5 strength and sensation in all 4 extremities Other:     ED Results / Procedures / Treatments   Labs (all labs ordered are listed, but only abnormal results are displayed) Labs Reviewed - No data to  display  EKG   RADIOLOGY   Official radiology report(s): DG Chest 2 View  Result Date: 11/20/2021 CLINICAL DATA:  Shortness of breath EXAM: CHEST - 2 VIEW COMPARISON:  11/02/2021 FINDINGS: Heart is upper limits normal in size. Mediastinal contours within normal limits. Scarring at the right lung base. No acute confluent opacities or effusions. No acute bony abnormality. IMPRESSION: No active cardiopulmonary disease. Electronically Signed   By: Rolm Baptise M.D.   On: 11/20/2021 20:56    PROCEDURES and INTERVENTIONS:  Procedures  Medications - No data to display   IMPRESSION / MDM / [place] / ED COURSE  I reviewed the triage vital signs and the nursing notes.  Differential diagnosis includes, but is not limited to, hypertension, stroke, anxiety, ACS  71 year old female presents to the ED with asymptomatic hypertension.  Blood pressure improved by the time she arrives.  No indications for diagnostics.  We discussed appropriate return precautions for the ED and following up with her PCP.     FINAL CLINICAL IMPRESSION(S) / ED DIAGNOSES   Final diagnoses:  Asymptomatic hypertension  Rx / DC Orders   ED Discharge Orders     None        Note:  This document was prepared using Dragon voice recognition software and may include unintentional dictation errors.   Vladimir Crofts, MD 11/21/21 [phone]

## 2021-11-21 NOTE — Progress Notes (Incomplete)
Cardiology Office Note:    Date:  11/21/2021   ID:  Victoria Hanson, DOB 1950/07/30, MRN [id number]  PCP:  Einar Pheasant, MD  Cardiologist:  Larae Grooms, MD   Referring MD: Einar Pheasant, MD   No chief complaint on file.   History of Present Illness:    Victoria Hanson is a 71 y.o. female with a hx of anxiety, "COPD", Parkinson's Disease, hyperlipidemia, palpitations from PAC's and PVC's, atypical chest pain ("normal cath 2003), and primary hypertension who is is being seen for increased BP after stopping Amlodipine and Lasix --> now in ED @ [facility].   ***  Past Medical History:  Diagnosis Date   Anxiety    Asthma    Atypical chest pain    a. 2003 Cath: reportedly nl per pt Victoria Hanson);  b. 04/2015 [facility] Admission, neg troponin.   Colon cancer ([place]) 1999   a. 1999: Pt had partial colectomy. Did develop lung metastasis. Had right upper lobectomy in 01/2007 then had associated chemotherapy.   COPD (chronic obstructive pulmonary disease) (HCC)    DDD (degenerative disc disease), lumbosacral    Depression    Echocardiogram abnormal    a. 02/2010 Echo: EF 55-60%, mild LAE, no regional wall motion abnormalities   Elevated transaminase level    a. ? NASH;  b. 06/2015 - nl LFTs.   GERD (gastroesophageal reflux disease)    Hematuria    Holter monitor, abnormal    a. 12/2009 Holter monitoring: NSR, rare PACs and PVCs.   Hypercholesterolemia    Hyperlipidemia    Hypertension    Leaky heart valve    a. Per pt report - no evidence of valvular abnormalities on prior echoes.   Lesion of skin of Right breast 10/20/2017   Lung metastases 2008   a. S/P resection (lobectomy - right)   Morbid obesity ([place])    Nephrolithiasis    Parkinson disease ([place]) 08/2016   [name]    Past Surgical History:  Procedure Laterality Date   BREAST EXCISIONAL BIOPSY Right 10/07/2009   benign   CARDIAC CATHETERIZATION  2003   negative as per pt report   CHOLECYSTECTOMY  2000   COLON SURGERY      Colon cancer, removed part of large colon   COLONOSCOPY N/A 04/25/2015   Procedure: COLONOSCOPY;  Surgeon: Manya Silvas, MD;  Location: [place];  Service: Endoscopy;  Laterality: N/A;   COLONOSCOPY WITH PROPOFOL N/A 06/14/2017   Procedure: COLONOSCOPY WITH PROPOFOL;  Surgeon: Manya Silvas, MD;  Location: [facility] ENDOSCOPY;  Service: Endoscopy;  Laterality: N/A;   COLONOSCOPY WITH PROPOFOL N/A 11/17/2019   Procedure: COLONOSCOPY WITH PROPOFOL;  Surgeon: [name], MD;  Location: [facility] ENDOSCOPY;  Service: Gastroenterology;  Laterality: N/A;   COLONOSCOPY WITH PROPOFOL N/A 03/21/2021   Procedure: COLONOSCOPY WITH PROPOFOL;  Surgeon: Lesly Rubenstein, MD;  Location: [facility] ENDOSCOPY;  Service: Endoscopy;  Laterality: N/A;   ESOPHAGOGASTRODUODENOSCOPY (EGD) WITH PROPOFOL N/A 03/21/2021   Procedure: ESOPHAGOGASTRODUODENOSCOPY (EGD) WITH PROPOFOL;  Surgeon: Lesly Rubenstein, MD;  Location: [facility] ENDOSCOPY;  Service: Endoscopy;  Laterality: N/A;   LOBECTOMY  01/2007   Right lung, upper lobe right side removed   TOTAL ABDOMINAL HYSTERECTOMY     abnormal bleeding   TUBAL LIGATION  1980    Current Medications: No outpatient medications have been marked as taking for the 11/22/21 encounter (Appointment) with Belva Crome, MD.     Allergies:   Macrobid [nitrofurantoin macrocrystal]; Antihistamines, diphenhydramine-type; Aspirin; Ceftin [cefuroxime axetil]; Celecoxib;  Cymbalta [duloxetine hcl]; Diphenhydramine; Escitalopram; Lexapro [escitalopram oxalate]; Metronidazole; Nitrofurantoin; Zoloft [sertraline hcl]; and Ciprofloxacin   Social History   Socioeconomic History   Marital status: Widowed    Spouse name: Not on file   Number of children: Not on file   Years of education: Not on file   Highest education level: Not on file  Occupational History   Not on file  Tobacco Use   Smoking status: Never    Passive exposure: Yes   Smokeless tobacco: Never   Tobacco comments:     husband and son smoked in her home.  Vaping Use   Vaping Use: Never used  Substance and Sexual Activity   Alcohol use: Not Currently    Alcohol/week: 0.0 standard drinks of alcohol   Drug use: No   Sexual activity: Never  Other Topics Concern   Not on file  Social History Narrative   Widowed.   Disabled (colon/lung CA), retired.   Lives in [place] with her dtr, grand-dtr, and son.   Active.   Gets regular exercise, walks.   Social Determinants of Health   Financial Resource Strain: Low Risk  (11/22/2020)   Overall Financial Resource Strain (CARDIA)    Difficulty of Paying Living Expenses: Not hard at all  Food Insecurity: No Food Insecurity (11/22/2020)   Hunger Vital Sign    Worried About Running Out of Food in the Last Year: Never true    Ran Out of Food in the Last Year: Never true  Transportation Needs: No Transportation Needs (11/22/2020)   PRAPARE - Hydrologist (Medical): No    Lack of Transportation (Non-Medical): No  Physical Activity: Not on file  Stress: No Stress Concern Present (11/22/2020)   [place]    Feeling of Stress : Not at all  Social Connections: Unknown (11/22/2020)   Social Connection and Isolation Panel [NHANES]    Frequency of Communication with Friends and Family: More than three times a week    Frequency of Social Gatherings with Friends and Family: More than three times a week    Attends Religious Services: Not on Advertising copywriter or Organizations: Not on file    Attends Archivist Meetings: Not on file    Marital Status: Not on file     Family History: The patient's family history includes Cancer in her father; Coronary artery disease in her mother; Diabetes in her father and mother; Heart attack in her brother; Heart attack (age of onset: 38) in her father; Hypertension in her father; Mental illness in her brother and  mother; Sudden death in her brother. There is no history of Breast cancer.  ROS:   Please see the history of present illness.    *** All other systems reviewed and are negative.  EKGs/Labs/Other Studies Reviewed:    The following studies were reviewed today: ***  EKG:  EKG ***  Recent Labs: 02/01/2021: NT-Pro BNP 508 07/04/2021: TSH 1.41 09/11/2021: B Natriuretic Peptide 379.3 11/05/2021: ALT 8 11/20/2021: BUN 23; Creatinine, Ser 0.90; Hemoglobin 11.0; Platelets 269; Potassium 4.1; Sodium 139  Recent Lipid Panel    Component Value Date/Time   CHOL 149 10/13/2021 0904   TRIG 100.0 10/13/2021 0904   HDL 57.10 10/13/2021 0904   CHOLHDL 3 10/13/2021 0904   VLDL 20.0 10/13/2021 0904   LDLCALC 72 10/13/2021 0904   LDLDIRECT 163.4 12/31/2012 1022    Physical  Exam:    VS:  There were no vitals taken for this visit.    Wt Readings from Last 3 Encounters:  11/21/21 188 lb (85.3 kg)  11/07/21 184 lb (83.5 kg)  11/05/21 184 lb (83.5 kg)     GEN: ***. No acute distress HEENT: Normal NECK: No JVD. LYMPHATICS: No lymphadenopathy CARDIAC: *** murmur. RRR *** gallop, or edema. VASCULAR: *** Normal Pulses. No bruits. RESPIRATORY:  Clear to auscultation without rales, wheezing or rhonchi  ABDOMEN: Soft, non-tender, non-distended, No pulsatile mass, MUSCULOSKELETAL: No deformity  SKIN: Warm and dry NEUROLOGIC:  Alert and oriented x 3 PSYCHIATRIC:  Normal affect   ASSESSMENT:    1. Shortness of breath   2. Mixed hyperlipidemia   3. Bilateral lower extremity edema   4. Morbid obesity ([place])    PLAN:    In order of problems listed above:  ***   Medication Adjustments/Labs and Tests Ordered: Current medicines are reviewed at length with the patient today.  Concerns regarding medicines are outlined above.  No orders of the defined types were placed in this encounter.  No orders of the defined types were placed in this encounter.   There are no Patient Instructions on file  for this visit.   Signed, Sinclair Grooms, MD  11/21/2021 5:50 PM    [facility]

## 2021-11-22 ENCOUNTER — Ambulatory Visit: Payer: Medicare Other | Admitting: Interventional Cardiology

## 2021-11-22 DIAGNOSIS — R0602 Shortness of breath: Secondary | ICD-10-CM

## 2021-11-22 DIAGNOSIS — C3490 Malignant neoplasm of unspecified part of unspecified bronchus or lung: Secondary | ICD-10-CM | POA: Diagnosis not present

## 2021-11-22 DIAGNOSIS — E782 Mixed hyperlipidemia: Secondary | ICD-10-CM

## 2021-11-22 DIAGNOSIS — J449 Chronic obstructive pulmonary disease, unspecified: Secondary | ICD-10-CM | POA: Diagnosis not present

## 2021-11-22 DIAGNOSIS — R600 Localized edema: Secondary | ICD-10-CM

## 2021-11-23 ENCOUNTER — Ambulatory Visit: Payer: Medicare Other

## 2021-11-23 NOTE — Progress Notes (Signed)
Cardiology Office Note    Date:  11/24/2021   ID:  Victoria Hanson, DOB 1950-12-05, MRN [id number]  PCP:  Einar Pheasant, MD  Cardiologist: Larae Grooms, MD  --> Requests to follow-up in [facility] Complaint  Patient presents with   Follow-up    Elevated BP    History of Present Illness:    Victoria Hanson is a 71 y.o. female with past medical history of chest pain (normal catheterization in 2003, coronary calcium score of 0 by coronary CT in 11/2018), palpitations (PAC's and PVC's by prior monitor), HTN, HLD, Parkinson's disease, COPD and history of colon cancer who presents to the office today for follow-up of elevated BP.  She was last examined by Dr. Irish Lack in 01/2021 and reported intermittent dyspnea and lower extremity edema but denied any chest pain or palpitations. She was continued on her current medical therapy.  In the interim, she called the office on 11/20/2021 reporting elevated BP of 230/100 as she had previously been on Amlodipine but this was discontinued due to hypotension. She presented to the ED later that evening as SBP had been greater than 200 but denied any associated symptoms at that time. BP was at 152/73 while in the ED. Lab work was reassuring and she was discharged home with no changes to her medication regimen.  In talking with the patient and her daughter today, she reports she was having issues with hypotension last month and had been taking Lasix daily along with Amlodipine 2.5 mg daily.  She was encouraged to stop both by her PCP and presented to the ED last week for elevated BP. She has since not checked her readings at home but blood pressure is well controlled at 136/70 during today's visit and further improved to 132/72 on recheck.  Says that she was feeling very dizzy and lightheaded when on Amlodipine and Lasix.  She is concerned about being off diuretic therapy as she has a history of pleural effusions. She denies any specific  orthopnea or PND.  No recent exertional chest pain or progressive dyspnea on exertion.  She does use supplemental oxygen at home for COPD.  Past Medical History:  Diagnosis Date   Anxiety    Asthma    Atypical chest pain    a. 2003 Cath: reportedly nl per pt [name]);  b. 04/2015 [facility] Admission, neg troponin.   Colon cancer ([place]) 1999   a. 1999: Pt had partial colectomy. Did develop lung metastasis. Had right upper lobectomy in 01/2007 then had associated chemotherapy.   COPD (chronic obstructive pulmonary disease) (HCC)    DDD (degenerative disc disease), lumbosacral    Depression    Echocardiogram abnormal    a. 02/2010 Echo: EF 55-60%, mild LAE, no regional wall motion abnormalities   Elevated transaminase level    a. ? NASH;  b. 06/2015 - nl LFTs.   GERD (gastroesophageal reflux disease)    Hematuria    Holter monitor, abnormal    a. 12/2009 Holter monitoring: NSR, rare PACs and PVCs.   Hypercholesterolemia    Hyperlipidemia    Hypertension    Leaky heart valve    a. Per pt report - no evidence of valvular abnormalities on prior echoes.   Lesion of skin of Right breast 10/20/2017   Lung metastases 2008   a. S/P resection (lobectomy - right)   Morbid obesity ([place])    Nephrolithiasis    Parkinson disease ([place]) 08/2016   [name]    Past  Surgical History:  Procedure Laterality Date   BREAST EXCISIONAL BIOPSY Right 10/07/2009   benign   CARDIAC CATHETERIZATION  2003   negative as per pt report   CHOLECYSTECTOMY  2000   COLON SURGERY     Colon cancer, removed part of large colon   COLONOSCOPY N/A 04/25/2015   Procedure: COLONOSCOPY;  Surgeon: Manya Silvas, MD;  Location: [place];  Service: Endoscopy;  Laterality: N/A;   COLONOSCOPY WITH PROPOFOL N/A 06/14/2017   Procedure: COLONOSCOPY WITH PROPOFOL;  Surgeon: Manya Silvas, MD;  Location: [facility] ENDOSCOPY;  Service: Endoscopy;  Laterality: N/A;   COLONOSCOPY WITH PROPOFOL N/A 11/17/2019   Procedure: COLONOSCOPY  WITH PROPOFOL;  Surgeon: [name], MD;  Location: [facility] ENDOSCOPY;  Service: Gastroenterology;  Laterality: N/A;   COLONOSCOPY WITH PROPOFOL N/A 03/21/2021   Procedure: COLONOSCOPY WITH PROPOFOL;  Surgeon: Lesly Rubenstein, MD;  Location: [facility] ENDOSCOPY;  Service: Endoscopy;  Laterality: N/A;   ESOPHAGOGASTRODUODENOSCOPY (EGD) WITH PROPOFOL N/A 03/21/2021   Procedure: ESOPHAGOGASTRODUODENOSCOPY (EGD) WITH PROPOFOL;  Surgeon: Lesly Rubenstein, MD;  Location: [facility] ENDOSCOPY;  Service: Endoscopy;  Laterality: N/A;   LOBECTOMY  01/2007   Right lung, upper lobe right side removed   TOTAL ABDOMINAL HYSTERECTOMY     abnormal bleeding   TUBAL LIGATION  1980    Current Medications: Outpatient Medications Prior to Visit  Medication Sig Dispense Refill   ALPRAZolam (XANAX) 0.5 MG tablet TAKE 1/2 TABLET BY MOUTH ONCE DAILY AS NEEDED 30 tablet 0   aspirin EC 81 MG tablet Take 81 mg by mouth as needed.      calcium-vitamin D (OSCAL WITH D) 500-200 MG-UNIT tablet Take 1 tablet by mouth.     carbidopa-levodopa (SINEMET) 25-100 MG tablet Take 1.5 in am and one at 11:00 and one in afternoon and 1.5 at night. 150 tablet 0   citalopram (CELEXA) 10 MG tablet TAKE ONE AND ONE-HALF TABLET BY MOUTH DAILY 90 tablet 1   fluticasone (FLONASE) 50 MCG/ACT nasal spray Place 2 sprays into both nostrils daily. 11.1 mL 0   fluticasone-salmeterol (ADVAIR HFA) 115-21 MCG/ACT inhaler Inhale 2 puffs into the lungs 2 (two) times daily. 1 Inhaler 4   montelukast (SINGULAIR) 10 MG tablet TAKE 1 TABLET BY MOUTH AT BEDTIME 30 tablet 2   omeprazole (PRILOSEC) 20 MG capsule Take 1 capsule (20 mg total) by mouth daily. 90 capsule 1   PROAIR HFA 108 (90 Base) MCG/ACT inhaler INHALE 2 PUFFS BY MOUTH INTO THE LUNGS EVERY 6 HOURS AS NEEDED FOR WHEEZING 8.5 g 2   rosuvastatin (CRESTOR) 5 MG tablet TAKE 1 TABLET BY MOUTH EVERY MONDAY, WEDNESDAY AND FRIDAY 90 tablet 1   traZODone (DESYREL) 50 MG tablet TAKE 2 TABLETS BY MOUTH AT  BEDTIME 180 tablet 1   vitamin B-12 (CYANOCOBALAMIN) 100 MCG tablet Take 100 mcg by mouth daily.     diclofenac Sodium (VOLTAREN) 1 % GEL Apply topically 4 (four) times daily. (Patient not taking: Reported on 11/24/2021)     ELIQUIS 2.5 MG TABS tablet Take 2.5 mg by mouth 2 (two) times daily. (Patient not taking: Reported on 11/24/2021)     furosemide (LASIX) 20 MG tablet TAKE 1 TABLET BY MOUTH DAILY AS NEEDED (Patient not taking: Reported on 11/24/2021) 30 tablet 2   predniSONE (DELTASONE) 10 MG tablet 6 tablets on Day 1 , then reduce by 1 tablet daily until gone 21 tablet 0   traMADol (ULTRAM) 50 MG tablet Take 50 mg by mouth every 6 (six)  hours as needed. (Patient not taking: Reported on 11/24/2021)     No facility-administered medications prior to visit.     Allergies:   Macrobid [nitrofurantoin macrocrystal]; Antihistamines, diphenhydramine-type; Aspirin; Ceftin [cefuroxime axetil]; Celecoxib; Cymbalta [duloxetine hcl]; Diphenhydramine; Escitalopram; Lexapro [escitalopram oxalate]; Metronidazole; Nitrofurantoin; Zoloft [sertraline hcl]; and Ciprofloxacin   Social History   Socioeconomic History   Marital status: Widowed    Spouse name: Not on file   Number of children: Not on file   Years of education: Not on file   Highest education level: Not on file  Occupational History   Not on file  Tobacco Use   Smoking status: Never    Passive exposure: Yes   Smokeless tobacco: Never   Tobacco comments:    husband and son smoked in her home.  Vaping Use   Vaping Use: Never used  Substance and Sexual Activity   Alcohol use: Not Currently    Alcohol/week: 0.0 standard drinks of alcohol   Drug use: No   Sexual activity: Never  Other Topics Concern   Not on file  Social History Narrative   Widowed.   Disabled (colon/lung CA), retired.   Lives in [place] with her dtr, grand-dtr, and son.   Active.   Gets regular exercise, walks.   Social Determinants of Health   Financial  Resource Strain: Low Risk  (11/22/2020)   Overall Financial Resource Strain (CARDIA)    Difficulty of Paying Living Expenses: Not hard at all  Food Insecurity: No Food Insecurity (11/22/2020)   Hunger Vital Sign    Worried About Running Out of Food in the Last Year: Never true    Ran Out of Food in the Last Year: Never true  Transportation Needs: No Transportation Needs (11/22/2020)   PRAPARE - Hydrologist (Medical): No    Lack of Transportation (Non-Medical): No  Physical Activity: Not on file  Stress: No Stress Concern Present (11/22/2020)   [place]    Feeling of Stress : Not at all  Social Connections: Unknown (11/22/2020)   Social Connection and Isolation Panel [NHANES]    Frequency of Communication with Friends and Family: More than three times a week    Frequency of Social Gatherings with Friends and Family: More than three times a week    Attends Religious Services: Not on Advertising copywriter or Organizations: Not on file    Attends Archivist Meetings: Not on file    Marital Status: Not on file     Family History:  The patient's family history includes Cancer in her father; Coronary artery disease in her mother; Diabetes in her father and mother; Heart attack in her brother; Heart attack (age of onset: 28) in her father; Hypertension in her father; Mental illness in her brother and mother; Sudden death in her brother.   Review of Systems:    Please see the history of present illness.     All other systems reviewed and are otherwise negative except as noted above.   Physical Exam:    VS:  BP 136/70   Pulse 73   Ht 5\' 4"  (1.626 m)   Wt 183 lb 9.6 oz (83.3 kg)   SpO2 95%   BMI 31.51 kg/m    General: Well developed, well nourished,female appearing in no acute distress. Head: Normocephalic, atraumatic. Neck: No carotid bruits. JVD not elevated.  Lungs:  Respirations regular and  unlabored, without wheezes or rales.  Heart: Regular rate and rhythm. No S3 or S4.  No murmur, no rubs, or gallops appreciated. Abdomen: Appears non-distended. No obvious abdominal masses. Msk:  Strength and tone appear normal for age. No obvious joint deformities or effusions. Extremities: No clubbing or cyanosis. Trace ankle edema bilaterally.  Distal pedal pulses are 2+ bilaterally. Neuro: Alert and oriented X 3. Moves all extremities spontaneously. No focal deficits noted. Psych:  Responds to questions appropriately with a normal affect. Skin: No rashes or lesions noted  Wt Readings from Last 3 Encounters:  11/24/21 183 lb 9.6 oz (83.3 kg)  11/21/21 188 lb (85.3 kg)  11/07/21 184 lb (83.5 kg)    Studies/Labs Reviewed:   EKG:  EKG is not ordered today.   Recent Labs: 02/01/2021: NT-Pro BNP 508 07/04/2021: TSH 1.41 09/11/2021: B Natriuretic Peptide 379.3 11/05/2021: ALT 8 11/20/2021: BUN 23; Creatinine, Ser 0.90; Hemoglobin 11.0; Platelets 269; Potassium 4.1; Sodium 139   Lipid Panel    Component Value Date/Time   CHOL 149 10/13/2021 0904   TRIG 100.0 10/13/2021 0904   HDL 57.10 10/13/2021 0904   CHOLHDL 3 10/13/2021 0904   VLDL 20.0 10/13/2021 0904   LDLCALC 72 10/13/2021 0904   LDLDIRECT 163.4 12/31/2012 1022    Additional studies/ records that were reviewed today include:   Coronary CT: 11/2018 IMPRESSION: 1. Coronary calcium score of 0. This was 0 percentile for age and sex matched control.   2. Normal coronary origin with right dominance.   3. No evidence for CAD.  Consider non-cardiac causes for chest pain.  Echo: 02/2021 IMPRESSIONS     1. Left ventricular ejection fraction, by estimation, is 60 to 65%. The  left ventricle has normal function. The left ventricle has no regional  wall motion abnormalities. Left ventricular diastolic parameters were  normal. The average left ventricular  global longitudinal strain is -19.4 %. The  global longitudinal strain is  normal.   2. Right ventricular systolic function is normal. The right ventricular  size is normal. There is mildly elevated pulmonary artery systolic  pressure.   3. Left atrial size was mild to moderately dilated.   4. The mitral valve is normal in structure. Mild to moderate mitral valve  regurgitation. No evidence of mitral stenosis.   5. The aortic valve is tricuspid. Aortic valve regurgitation is not  visualized. No aortic stenosis is present.   6. The inferior vena cava is normal in size with greater than 50%  respiratory variability, suggesting right atrial pressure of 3 mmHg.   Assessment:    1. Essential hypertension   2. Bilateral lower extremity edema   3. Chest pain of uncertain etiology   4. Hyperlipidemia LDL goal <70      Plan:   In order of problems listed above:  1. HTN - Her blood pressure was previously dropping when on both Lasix 20 mg daily and Amlodipine 2.5 mg daily.  Question if this was possibly secondary to dehydration at that time. She has remained off Amlodipine even following ED evaluation last week and BP is well controlled during today's visit. I encouraged her to keep a BP log at home and report back with readings in several weeks. Will remain off Amlodipine for now.  2. Lower Extremity Edema - She previously had worsening issues with lower extremity edema and also pleural effusions on CXR but effusions had resolved by repeat CXR last week. Reviewed indications for when to take Lasix and she will  only take 20 mg if needed for worsening edema, weight gain greater than 2 pounds overnight or 5 pounds in 1 week.  3. History of Chest Pain - She had normal coronary arteries by prior catheterization in 2003 and did have a calcium score of 0 by Coronary CT in 11/2018. She has baseline dyspnea on exertion in the setting of COPD but no recent chest pain. Continue ASA 81 mg daily and Crestor 5 mg MWF.   4. HLD - FLP in 10/2021  showed LDL was down to 72. She has been intolerant to higher intensity statin therapy and remains on Crestor 5 mg MWF.    Medication Adjustments/Labs and Tests Ordered: Current medicines are reviewed at length with the patient today.  Concerns regarding medicines are outlined above.  Medication changes, Labs and Tests ordered today are listed in the Patient Instructions below. Patient Instructions  Medication Instructions:   Please limit salt intake.  Please weight yourself every morning. Take a Lasix tablet if weight increases by 2 pounds overnight or 5 pounds in a single week.  *If you need a refill on your cardiac medications before your next appointment, please call your pharmacy*    Testing/Procedures:  None   Follow-Up: At [facility], you and your health needs are our priority.  As part of our continuing mission to provide you with exceptional heart care, we have created designated Provider Care Teams.  These Care Teams include your primary Cardiologist (physician) and Advanced Practice Providers (APPs -  Physician Assistants and Nurse Practitioners) who all work together to provide you with the care you need, when you need it.  We recommend signing up for the patient portal called "MyChart".  Sign up information is provided on this After Visit Summary.  MyChart is used to connect with patients for Virtual Visits (Telemedicine).  Patients are able to view lab/test results, encounter notes, upcoming appointments, etc.  Non-urgent messages can be sent to your provider as well.   To learn more about what you can do with MyChart, go to [email or web address].    Your next appointment:   6 months with Bernerd Pho, PA-C or MD (wants to follow-up in [facility])  [place], Erma Heritage, PA-C  11/24/2021 4:32 PM    [place] [address] [place], [place] [address] Phone: [phone] Fax: [phone]

## 2021-11-24 ENCOUNTER — Encounter: Payer: Self-pay | Admitting: Student

## 2021-11-24 ENCOUNTER — Ambulatory Visit (INDEPENDENT_AMBULATORY_CARE_PROVIDER_SITE_OTHER): Payer: Medicare Other | Admitting: Student

## 2021-11-24 VITALS — BP 136/70 | HR 73 | Ht 64.0 in | Wt 183.6 lb

## 2021-11-24 DIAGNOSIS — E785 Hyperlipidemia, unspecified: Secondary | ICD-10-CM | POA: Diagnosis not present

## 2021-11-24 DIAGNOSIS — R600 Localized edema: Secondary | ICD-10-CM

## 2021-11-24 DIAGNOSIS — I10 Essential (primary) hypertension: Secondary | ICD-10-CM

## 2021-11-24 DIAGNOSIS — R079 Chest pain, unspecified: Secondary | ICD-10-CM

## 2021-11-24 MED ORDER — FUROSEMIDE 20 MG PO TABS: 20.0000 mg | ORAL_TABLET | Freq: Every day | ORAL | 3 refills | Status: DC | PRN

## 2021-11-24 NOTE — Patient Instructions (Addendum)
Medication Instructions:   Please limit salt intake.  Please weight yourself every morning. Take a Lasix tablet if weight increases by 2 pounds overnight or 5 pounds in a single week.  *If you need a refill on your cardiac medications before your next appointment, please call your pharmacy*    Testing/Procedures:  None   Follow-Up: At [facility], you and your health needs are our priority.  As part of our continuing mission to provide you with exceptional heart care, we have created designated Provider Care Teams.  These Care Teams include your primary Cardiologist (physician) and Advanced Practice Providers (APPs -  Physician Assistants and Nurse Practitioners) who all work together to provide you with the care you need, when you need it.  We recommend signing up for the patient portal called "MyChart".  Sign up information is provided on this After Visit Summary.  MyChart is used to connect with patients for Virtual Visits (Telemedicine).  Patients are able to view lab/test results, encounter notes, upcoming appointments, etc.  Non-urgent messages can be sent to your provider as well.   To learn more about what you can do with MyChart, go to [email or web address].    Your next appointment:   6 months with Bernerd Pho, PA-C or MD (wants to follow-up in [facility])  [place]

## 2021-11-28 ENCOUNTER — Telehealth: Payer: Self-pay

## 2021-11-28 NOTE — Telephone Encounter (Signed)
Attempted to call pt for pre-visit planning. LM for pt to cb

## 2021-11-29 ENCOUNTER — Encounter: Payer: Self-pay | Admitting: Internal Medicine

## 2021-11-29 ENCOUNTER — Ambulatory Visit (INDEPENDENT_AMBULATORY_CARE_PROVIDER_SITE_OTHER): Payer: Medicare Other | Admitting: Internal Medicine

## 2021-11-29 ENCOUNTER — Ambulatory Visit (INDEPENDENT_AMBULATORY_CARE_PROVIDER_SITE_OTHER): Payer: Medicare Other

## 2021-11-29 VITALS — BP 132/70 | HR 70 | Temp 98.4°F | Resp 17 | Ht 64.0 in | Wt 182.2 lb

## 2021-11-29 DIAGNOSIS — R739 Hyperglycemia, unspecified: Secondary | ICD-10-CM

## 2021-11-29 DIAGNOSIS — I773 Arterial fibromuscular dysplasia: Secondary | ICD-10-CM

## 2021-11-29 DIAGNOSIS — I10 Essential (primary) hypertension: Secondary | ICD-10-CM | POA: Diagnosis not present

## 2021-11-29 DIAGNOSIS — K219 Gastro-esophageal reflux disease without esophagitis: Secondary | ICD-10-CM

## 2021-11-29 DIAGNOSIS — F419 Anxiety disorder, unspecified: Secondary | ICD-10-CM

## 2021-11-29 DIAGNOSIS — R42 Dizziness and giddiness: Secondary | ICD-10-CM

## 2021-11-29 DIAGNOSIS — E7800 Pure hypercholesterolemia, unspecified: Secondary | ICD-10-CM

## 2021-11-29 DIAGNOSIS — F339 Major depressive disorder, recurrent, unspecified: Secondary | ICD-10-CM

## 2021-11-29 DIAGNOSIS — K760 Fatty (change of) liver, not elsewhere classified: Secondary | ICD-10-CM

## 2021-11-29 DIAGNOSIS — Z1231 Encounter for screening mammogram for malignant neoplasm of breast: Secondary | ICD-10-CM

## 2021-11-29 DIAGNOSIS — D649 Anemia, unspecified: Secondary | ICD-10-CM | POA: Diagnosis not present

## 2021-11-29 DIAGNOSIS — N183 Chronic kidney disease, stage 3 unspecified: Secondary | ICD-10-CM | POA: Diagnosis not present

## 2021-11-29 DIAGNOSIS — R8290 Unspecified abnormal findings in urine: Secondary | ICD-10-CM | POA: Diagnosis not present

## 2021-11-29 DIAGNOSIS — I779 Disorder of arteries and arterioles, unspecified: Secondary | ICD-10-CM | POA: Diagnosis not present

## 2021-11-29 DIAGNOSIS — Z0000 Encounter for general adult medical examination without abnormal findings: Secondary | ICD-10-CM

## 2021-11-29 DIAGNOSIS — Z8589 Personal history of malignant neoplasm of other organs and systems: Secondary | ICD-10-CM

## 2021-11-29 DIAGNOSIS — I700 Atherosclerosis of aorta: Secondary | ICD-10-CM

## 2021-11-29 LAB — URINALYSIS, ROUTINE W REFLEX MICROSCOPIC
Bilirubin Urine: NEGATIVE
Ketones, ur: NEGATIVE
Nitrite: POSITIVE — AB
Specific Gravity, Urine: 1.02 (ref 1.000–1.030)
Total Protein, Urine: 30 — AB
Urine Glucose: NEGATIVE
Urobilinogen, UA: 0.2 (ref 0.0–1.0)
pH: 6 (ref 5.0–8.0)

## 2021-11-29 LAB — CBC WITH DIFFERENTIAL/PLATELET
Basophils Absolute: 0.1 10*3/uL (ref 0.0–0.1)
Basophils Relative: 0.6 % (ref 0.0–3.0)
Eosinophils Absolute: 0.1 10*3/uL (ref 0.0–0.7)
Eosinophils Relative: 0.8 % (ref 0.0–5.0)
HCT: 36.5 % (ref 36.0–46.0)
Hemoglobin: 11.7 g/dL — ABNORMAL LOW (ref 12.0–15.0)
Lymphocytes Relative: 15.8 % (ref 12.0–46.0)
Lymphs Abs: 1.7 10*3/uL (ref 0.7–4.0)
MCHC: 32 g/dL (ref 30.0–36.0)
MCV: 88.4 fl (ref 78.0–100.0)
Monocytes Absolute: 0.8 10*3/uL (ref 0.1–1.0)
Monocytes Relative: 7.1 % (ref 3.0–12.0)
Neutro Abs: 8.1 10*3/uL — ABNORMAL HIGH (ref 1.4–7.7)
Neutrophils Relative %: 75.7 % (ref 43.0–77.0)
Platelets: 264 10*3/uL (ref 150.0–400.0)
RBC: 4.13 Mil/uL (ref 3.87–5.11)
RDW: 14.6 % (ref 11.5–15.5)
WBC: 10.7 10*3/uL — ABNORMAL HIGH (ref 4.0–10.5)

## 2021-11-29 LAB — HEMOGLOBIN A1C: Hgb A1c MFr Bld: 6.2 % (ref 4.6–6.5)

## 2021-11-29 NOTE — Progress Notes (Unsigned)
Patient ID: Victoria Hanson, female   DOB: [birth year], 71 y.o.   MRN: [id number]   Subjective:    Patient ID: Victoria Hanson, female    DOB: [birth year], 71 y.o.   MRN: [id number]   Patient here for ER follow up.   Chief Complaint  Patient presents with   Hypertension   Shortness of Breath   .   HPI    Past Medical History:  Diagnosis Date   Anxiety    Asthma    Atypical chest pain    a. 2003 Cath: reportedly nl per pt [name]);  b. 04/2015 [facility]mission, neg troponin.   Colon cancer ([place]) 1999   a. 1999: Pt had partial colectomy. Did develop lung metastasis. Had right upper lobectomy in 01/2007 then had associated chemotherapy.   COPD (chronic obstructive pulmonary disease) (HCC)    DDD (degenerative disc disease), lumbosacral    Depression    Echocardiogram abnormal    a. 02/2010 Echo: EF 55-60%, mild LAE, no regional wall motion abnormalities   Elevated transaminase level    a. ? NASH;  b. 06/2015 - nl LFTs.   GERD (gastroesophageal reflux disease)    Hematuria    Holter monitor, abnormal    a. 12/2009 Holter monitoring: NSR, rare PACs and PVCs.   Hypercholesterolemia    Hyperlipidemia    Hypertension    Leaky heart valve    a. Per pt report - no evidence of valvular abnormalities on prior echoes.   Lesion of skin of Right breast 10/20/2017   Lung metastases 2008   a. S/P resection (lobectomy - right)   Morbid obesity ([place])    Nephrolithiasis    Parkinson disease ([place]) 08/2016   [name]   Past Surgical History:  Procedure Laterality Date   BREAST EXCISIONAL BIOPSY Right 10/07/2009   benign   CARDIAC CATHETERIZATION  2003   negative as per pt report   CHOLECYSTECTOMY  2000   COLON SURGERY     Colon cancer, removed part of large colon   COLONOSCOPY N/A 04/25/2015   Procedure: COLONOSCOPY;  Surgeon: Manya Silvas, MD;  Location: [facility];  Service: Endoscopy;  Laterality: N/A;   COLONOSCOPY WITH PROPOFOL N/A 06/14/2017   Procedure: COLONOSCOPY WITH  PROPOFOL;  Surgeon: Manya Silvas, MD;  Location: [facility] ENDOSCOPY;  Service: Endoscopy;  Laterality: N/A;   COLONOSCOPY WITH PROPOFOL N/A 11/17/2019   Procedure: COLONOSCOPY WITH PROPOFOL;  Surgeon: [name], MD;  Location: [facility] ENDOSCOPY;  Service: Gastroenterology;  Laterality: N/A;   COLONOSCOPY WITH PROPOFOL N/A 03/21/2021   Procedure: COLONOSCOPY WITH PROPOFOL;  Surgeon: Lesly Rubenstein, MD;  Location: [facility] ENDOSCOPY;  Service: Endoscopy;  Laterality: N/A;   ESOPHAGOGASTRODUODENOSCOPY (EGD) WITH PROPOFOL N/A 03/21/2021   Procedure: ESOPHAGOGASTRODUODENOSCOPY (EGD) WITH PROPOFOL;  Surgeon: Lesly Rubenstein, MD;  Location: [facility] ENDOSCOPY;  Service: Endoscopy;  Laterality: N/A;   LOBECTOMY  01/2007   Right lung, upper lobe right side removed   TOTAL ABDOMINAL HYSTERECTOMY     abnormal bleeding   TUBAL LIGATION  1980   Family History  Problem Relation Age of Onset   Coronary artery disease Mother        died @ 56 of MI.   Mental illness Mother    Diabetes Mother    Heart attack Father 21       MI   Cancer Father        lung - died in his mid-70's   Hypertension Father    Diabetes Father  Sudden death Brother    Mental illness Brother    Heart attack Brother        MI @ 33, died @ age 73.   Breast cancer Neg Hx    Social History   Socioeconomic History   Marital status: Widowed    Spouse name: Not on file   Number of children: Not on file   Years of education: Not on file   Highest education level: Not on file  Occupational History   Not on file  Tobacco Use   Smoking status: Never    Passive exposure: Yes   Smokeless tobacco: Never   Tobacco comments:    husband and son smoked in her home.  Vaping Use   Vaping Use: Never used  Substance and Sexual Activity   Alcohol use: Not Currently    Alcohol/week: 0.0 standard drinks of alcohol   Drug use: No   Sexual activity: Never  Other Topics Concern   Not on file  Social History Narrative   Widowed.    Disabled (colon/lung CA), retired.   Lives in [place] with her dtr, grand-dtr, and son.   Active.   Gets regular exercise, walks.   Social Determinants of Health   Financial Resource Strain: Low Risk  (11/22/2020)   Overall Financial Resource Strain (CARDIA)    Difficulty of Paying Living Expenses: Not hard at all  Food Insecurity: No Food Insecurity (11/22/2020)   Hunger Vital Sign    Worried About Running Out of Food in the Last Year: Never true    Ran Out of Food in the Last Year: Never true  Transportation Needs: No Transportation Needs (11/22/2020)   PRAPARE - Hydrologist (Medical): No    Lack of Transportation (Non-Medical): No  Physical Activity: Not on file  Stress: No Stress Concern Present (11/22/2020)   [place]    Feeling of Stress : Not at all  Social Connections: Unknown (11/22/2020)   Social Connection and Isolation Panel [NHANES]    Frequency of Communication with Friends and Family: More than three times a week    Frequency of Social Gatherings with Friends and Family: More than three times a week    Attends Religious Services: Not on Advertising copywriter or Organizations: Not on file    Attends Archivist Meetings: Not on file    Marital Status: Not on file     Review of Systems     Objective:     BP 132/70 (BP Location: Left Arm, Patient Position: Sitting, Cuff Size: Small)   Pulse 70   Temp 98.4 F (36.9 C) (Temporal)   Resp 17   Ht 5\' 4"  (1.626 m)   Wt 182 lb 3.2 oz (82.6 kg)   SpO2 97%   BMI 31.27 kg/m  Wt Readings from Last 3 Encounters:  11/29/21 182 lb 3.2 oz (82.6 kg)  11/24/21 183 lb 9.6 oz (83.3 kg)  11/21/21 188 lb (85.3 kg)    Physical Exam   Outpatient Encounter Medications as of 11/29/2021  Medication Sig   ALPRAZolam (XANAX) 0.5 MG tablet TAKE 1/2 TABLET BY MOUTH ONCE DAILY AS NEEDED   aspirin EC 81 MG  tablet Take 81 mg by mouth as needed.    calcium-vitamin D (OSCAL WITH D) 500-200 MG-UNIT tablet Take 1 tablet by mouth.   carbidopa-levodopa (SINEMET) 25-100 MG tablet Take 1.5 in am and  one at 11:00 and one in afternoon and 1.5 at night.   citalopram (CELEXA) 10 MG tablet TAKE ONE AND ONE-HALF TABLET BY MOUTH DAILY   fluticasone (FLONASE) 50 MCG/ACT nasal spray Place 2 sprays into both nostrils daily.   fluticasone-salmeterol (ADVAIR HFA) 115-21 MCG/ACT inhaler Inhale 2 puffs into the lungs 2 (two) times daily.   furosemide (LASIX) 20 MG tablet Take 1 tablet (20 mg total) by mouth daily as needed for edema.   montelukast (SINGULAIR) 10 MG tablet TAKE 1 TABLET BY MOUTH AT BEDTIME   omeprazole (PRILOSEC) 20 MG capsule Take 1 capsule (20 mg total) by mouth daily.   OXYGEN Inhale into the lungs. PRN   PROAIR HFA 108 (90 Base) MCG/ACT inhaler INHALE 2 PUFFS BY MOUTH INTO THE LUNGS EVERY 6 HOURS AS NEEDED FOR WHEEZING   rosuvastatin (CRESTOR) 5 MG tablet TAKE 1 TABLET BY MOUTH EVERY MONDAY, WEDNESDAY AND FRIDAY   traZODone (DESYREL) 50 MG tablet TAKE 2 TABLETS BY MOUTH AT BEDTIME   vitamin B-12 (CYANOCOBALAMIN) 100 MCG tablet Take 100 mcg by mouth daily.   diclofenac Sodium (VOLTAREN) 1 % GEL Apply topically 4 (four) times daily. (Patient not taking: Reported on 11/24/2021)   No facility-administered encounter medications on file as of 11/29/2021.     Lab Results  Component Value Date   WBC 13.5 (H) 11/20/2021   HGB 11.0 (L) 11/20/2021   HCT 35.8 (L) 11/20/2021   PLT 269 11/20/2021   GLUCOSE 109 (H) 11/20/2021   CHOL 149 10/13/2021   TRIG 100.0 10/13/2021   HDL 57.10 10/13/2021   LDLDIRECT 163.4 12/31/2012   LDLCALC 72 10/13/2021   ALT 8 11/05/2021   AST 16 11/05/2021   NA 139 11/20/2021   K 4.1 11/20/2021   CL 107 11/20/2021   CREATININE 0.90 11/20/2021   BUN 23 11/20/2021   CO2 27 11/20/2021   TSH 1.41 07/04/2021   INR 1.0 12/12/2012   HGBA1C 5.8 07/04/2021       Assessment &  Plan:   Problem List Items Addressed This Visit   None Visit Diagnoses     Encounter for screening mammogram for malignant neoplasm of breast    -  Primary   Relevant Orders   MM 3D SCREEN BREAST BILATERAL        Einar Pheasant, MD

## 2021-11-29 NOTE — Patient Instructions (Addendum)
  Victoria Hanson , Thank you for taking time to come for your Medicare Wellness Visit. I appreciate your ongoing commitment to your health goals. Please review the following plan we discussed and let me know if I can assist you in the future.   These are the goals we discussed:  Goals       Patient Stated     Healthy diet (pt-stated)      Low salt        This is a list of the screening recommended for you and due dates:  Health Maintenance  Topic Date Due   Zoster (Shingles) Vaccine (1 of 2) Never done   Mammogram  02/07/2019   COVID-19 Vaccine (3 - Moderna risk series) 12/14/2021*   Flu Shot  01/09/2022   Colon Cancer Screening  03/21/2026   Tetanus Vaccine  01/07/2030   Pneumonia Vaccine  Completed   DEXA scan (bone density measurement)  Completed   Hepatitis C Screening: USPSTF Recommendation to screen - Ages 20-79 yo.  Completed   HPV Vaccine  Aged Out  *Topic was postponed. The date shown is not the original due date.

## 2021-11-29 NOTE — Progress Notes (Signed)
Subjective:   KYRIE BUN is a 71 y.o. female who presents for Medicare Annual (Subsequent) preventive examination.  Review of Systems    No ROS.  Medicare Wellness   Cardiac Risk Factors include: advanced age (>54men, >24 women);hypertension     Objective:    Today's Vitals   11/29/21 1050  BP: 132/70  Pulse: 70  Resp: 17  Temp: 98.4 F (36.9 C)  SpO2: 97%  Weight: 182 lb 3.2 oz (82.6 kg)  Height: 5\' 4"  (1.626 m)   Body mass index is 31.27 kg/m.     11/29/2021   10:57 AM 11/21/2021    5:29 AM 11/20/2021    8:16 PM 11/05/2021    7:40 AM 03/21/2021    7:25 AM 11/22/2020    9:58 AM 09/19/2020    1:51 PM  Advanced Directives  Does Patient Have a Medical Advance Directive? Yes No No No No Yes No  Type of Estate agent of [place];Living will   Does patient want to make changes to medical advance directive? No - Patient declined     No - Patient declined   Copy of [place] in Chart? Yes - validated most recent copy scanned in chart (See row information)     Yes - validated most recent copy scanned in chart (See row information)   Would patient like information on creating a medical advance directive?  No - Patient declined  No - Patient declined No - Patient declined  No - Patient declined    Current Medications (verified) Outpatient Encounter Medications as of 11/29/2021  Medication Sig   ALPRAZolam (XANAX) 0.5 MG tablet TAKE 1/2 TABLET BY MOUTH ONCE DAILY AS NEEDED   aspirin EC 81 MG tablet Take 81 mg by mouth as needed.    calcium-vitamin D (OSCAL WITH D) 500-200 MG-UNIT tablet Take 1 tablet by mouth.   carbidopa-levodopa (SINEMET) 25-100 MG tablet Take 1.5 in am and one at 11:00 and one in afternoon and 1.5 at night.   citalopram (CELEXA) 10 MG tablet TAKE ONE AND ONE-HALF TABLET BY MOUTH DAILY   fluticasone (FLONASE) 50 MCG/ACT nasal spray Place 2 sprays into both nostrils daily.    fluticasone-salmeterol (ADVAIR HFA) 115-21 MCG/ACT inhaler Inhale 2 puffs into the lungs 2 (two) times daily.   furosemide (LASIX) 20 MG tablet Take 1 tablet (20 mg total) by mouth daily as needed for edema.   montelukast (SINGULAIR) 10 MG tablet TAKE 1 TABLET BY MOUTH AT BEDTIME   omeprazole (PRILOSEC) 20 MG capsule Take 1 capsule (20 mg total) by mouth daily.   PROAIR HFA 108 (90 Base) MCG/ACT inhaler INHALE 2 PUFFS BY MOUTH INTO THE LUNGS EVERY 6 HOURS AS NEEDED FOR WHEEZING   rosuvastatin (CRESTOR) 5 MG tablet TAKE 1 TABLET BY MOUTH EVERY MONDAY, WEDNESDAY AND FRIDAY   traZODone (DESYREL) 50 MG tablet TAKE 2 TABLETS BY MOUTH AT BEDTIME   vitamin B-12 (CYANOCOBALAMIN) 100 MCG tablet Take 100 mcg by mouth daily.   No facility-administered encounter medications on file as of 11/29/2021.    Allergies (verified) Macrobid [nitrofurantoin macrocrystal]; Antihistamines, diphenhydramine-type; Aspirin; Ceftin [cefuroxime axetil]; Celecoxib; Cymbalta [duloxetine hcl]; Diphenhydramine; Escitalopram; Lexapro [escitalopram oxalate]; Metronidazole; Nitrofurantoin; Zoloft [sertraline hcl]; and Ciprofloxacin   History: Past Medical History:  Diagnosis Date   Anxiety    Asthma    Atypical chest pain    a. 2003 Cath: reportedly nl per pt [name]);  b. 04/2015  [facility] Admission, neg troponin.   Colon cancer ([place]) 1999   a. 1999: Pt had partial colectomy. Did develop lung metastasis. Had right upper lobectomy in 01/2007 then had associated chemotherapy.   COPD (chronic obstructive pulmonary disease) (HCC)    DDD (degenerative disc disease), lumbosacral    Depression    Echocardiogram abnormal    a. 02/2010 Echo: EF 55-60%, mild LAE, no regional wall motion abnormalities   Elevated transaminase level    a. ? NASH;  b. 06/2015 - nl LFTs.   GERD (gastroesophageal reflux disease)    Hematuria    Holter monitor, abnormal    a. 12/2009 Holter monitoring: NSR, rare PACs and PVCs.   Hypercholesterolemia     Hyperlipidemia    Hypertension    Leaky heart valve    a. Per pt report - no evidence of valvular abnormalities on prior echoes.   Lesion of skin of Right breast 10/20/2017   Lung metastases 2008   a. S/P resection (lobectomy - right)   Morbid obesity ([place])    Nephrolithiasis    Parkinson disease ([place]) 08/2016   [name]   Past Surgical History:  Procedure Laterality Date   BREAST EXCISIONAL BIOPSY Right 10/07/2009   benign   CARDIAC CATHETERIZATION  2003   negative as per pt report   CHOLECYSTECTOMY  2000   COLON SURGERY     Colon cancer, removed part of large colon   COLONOSCOPY N/A 04/25/2015   Procedure: COLONOSCOPY;  Surgeon: Manya Silvas, MD;  Location: [facility];  Service: Endoscopy;  Laterality: N/A;   COLONOSCOPY WITH PROPOFOL N/A 06/14/2017   Procedure: COLONOSCOPY WITH PROPOFOL;  Surgeon: Manya Silvas, MD;  Location: [facility] ENDOSCOPY;  Service: Endoscopy;  Laterality: N/A;   COLONOSCOPY WITH PROPOFOL N/A 11/17/2019   Procedure: COLONOSCOPY WITH PROPOFOL;  Surgeon: [name], MD;  Location[facility] ENDOSCOPY;  Service: Gastroenterology;  Laterality: N/A;   COLONOSCOPY WITH PROPOFOL N/A 03/21/2021   Procedure: COLONOSCOPY WITH PROPOFOL;  Surgeon: Lesly Rubenstein, MD;  Location: [facility] ENDOSCOPY;  Service: Endoscopy;  Laterality: N/A;   ESOPHAGOGASTRODUODENOSCOPY (EGD) WITH PROPOFOL N/A 03/21/2021   Procedure: ESOPHAGOGASTRODUODENOSCOPY (EGD) WITH PROPOFOL;  Surgeon: Lesly Rubenstein, MD;  Location: [facility] ENDOSCOPY;  Service: Endoscopy;  Laterality: N/A;   LOBECTOMY  01/2007   Right lung, upper lobe right side removed   TOTAL ABDOMINAL HYSTERECTOMY     abnormal bleeding   TUBAL LIGATION  1980   Family History  Problem Relation Age of Onset   Coronary artery disease Mother        died @ 65 of MI.   Mental illness Mother    Diabetes Mother    Heart attack Father 23       MI   Cancer Father        lung - died in his mid-70's   Hypertension Father     Diabetes Father    Sudden death Brother    Mental illness Brother    Heart attack Brother        MI @ 25, died @ age 85.   Breast cancer Neg Hx    Social History   Socioeconomic History   Marital status: Widowed    Spouse name: Not on file   Number of children: Not on file   Years of education: Not on file   Highest education level: Not on file  Occupational History   Not on file  Tobacco Use   Smoking status: Never    Passive  exposure: Yes   Smokeless tobacco: Never   Tobacco comments:    husband and son smoked in her home.  Vaping Use   Vaping Use: Never used  Substance and Sexual Activity   Alcohol use: Not Currently    Alcohol/week: 0.0 standard drinks of alcohol   Drug use: No   Sexual activity: Never  Other Topics Concern   Not on file  Social History Narrative   Widowed.   Disabled (colon/lung CA), retired.   Lives in [place] with her dtr, grand-dtr, and son.   Active.   Gets regular exercise, walks.   Social Determinants of Health   Financial Resource Strain: Low Risk  (11/29/2021)   Overall Financial Resource Strain (CARDIA)    Difficulty of Paying Living Expenses: Not hard at all  Food Insecurity: No Food Insecurity (11/29/2021)   Hunger Vital Sign    Worried About Running Out of Food in the Last Year: Never true    Ran Out of Food in the Last Year: Never true  Transportation Needs: No Transportation Needs (11/29/2021)   PRAPARE - Hydrologist (Medical): No    Lack of Transportation (Non-Medical): No  Physical Activity: Not on file  Stress: No Stress Concern Present (11/29/2021)   [place]    Feeling of Stress : Only a little  Social Connections: Unknown (11/29/2021)   Social Connection and Isolation Panel [NHANES]    Frequency of Communication with Friends and Family: More than three times a week    Frequency of Social Gatherings with Friends and  Family: More than three times a week    Attends Religious Services: Not on Advertising copywriter or Organizations: Not on file    Attends Archivist Meetings: Not on file    Marital Status: Not on file    Tobacco Counseling Counseling given: Not Answered Tobacco comments: husband and son smoked in her home.   Clinical Intake:  Pre-visit preparation completed: Yes        Diabetes: No  How often do you need to have someone help you when you read instructions, pamphlets, or other written materials from your doctor or pharmacy?: 1 - Never   Interpreter Needed?: No      Activities of Daily Living    11/29/2021   10:58 AM  In your present state of health, do you have any difficulty performing the following activities:  Hearing? 0  Vision? 0  Difficulty concentrating or making decisions? 0  Walking or climbing stairs? 0  Dressing or bathing? 0  Doing errands, shopping? 1  Comment [place] and eating ? N  Using the Toilet? N  In the past six months, have you accidently leaked urine? N  Do you have problems with loss of bowel control? N  Managing your Medications? N  Managing your Finances? N  Housekeeping or managing your Housekeeping? N    Patient Care Team: Einar Pheasant, MD as PCP - General (Unknown Physician Specialty) Jettie Booze, MD as PCP - Cardiology (Cardiology) Manya Silvas, MD (Inactive) (Gastroenterology)  Indicate any recent Medical Services you may have received from other than [facility] providers in the past year (date may be approximate).     Assessment:   This is a routine wellness examination for [place].  Hearing/Vision screen Hearing Screening - Comments:: Patient is able to hear conversational tones without difficulty. No issues  reported.  Vision Screening - Comments:: Followed by [facility] (Dr. Matilde Sprang) Wears corrective lenses Annual visits  They have regular follow up with the  ophthalmologist  Dietary issues and exercise activities discussed: Current Exercise Habits: Home exercise routine, Intensity: Mild Regular diet   Goals Addressed               This Visit's Progress     Patient Stated     Healthy diet (pt-stated)   On track     Low salt       Depression Screen    11/29/2021    9:15 AM 11/02/2021    2:18 PM 10/03/2021    3:03 PM 11/22/2020   10:15 AM 11/04/2020    4:08 PM 07/22/2020    2:45 PM 04/21/2020   10:37 AM  PHQ 2/9 Scores  PHQ - 2 Score 2 1 0 0 2 0 0  PHQ- 9 Score 4   0 4 0 0    Fall Risk    11/29/2021    9:14 AM 11/02/2021    2:18 PM 10/03/2021    3:03 PM 11/22/2020   10:00 AM 11/04/2020    4:07 PM  [place] in the past year?  0 0 0 0  Number falls in past yr:  0  0   Injury with Fall?  0  0   Risk for fall due to : Impaired balance/gait No Fall Risks History of fall(s);Impaired mobility    Follow up Falls evaluation completed Falls evaluation completed Falls evaluation completed Falls evaluation completed Falls evaluation completed    FALL RISK PREVENTION PERTAINING TO THE HOME: Home free of loose throw rugs in walkways, pet beds, electrical cords, etc? Yes  Adequate lighting in your home to reduce risk of falls? Yes   ASSISTIVE DEVICES UTILIZED TO PREVENT FALLS: Life alert? No  Use of a cane, walker or w/c? No   TIMED UP AND GO: Was the test performed? No .   Cognitive Function:        11/22/2020   10:16 AM 11/20/2019    9:56 AM 11/19/2018    9:55 AM 05/29/2017    8:48 AM  6CIT Screen  What Year? 0 points 0 points 0 points 0 points  What month? 0 points 0 points 0 points 0 points  What time? 0 points 0 points 0 points 0 points  Count back from 20 0 points 0 points 0 points 0 points  Months in reverse 0 points 0 points 0 points 0 points  Repeat phrase  2 points 0 points 0 points  Total Score  2 points 0 points 0 points    Immunizations Immunization History  Administered Date(s) Administered    Fluad Quad(high Dose 65+) 02/26/2019, 02/24/2020   Influenza, High Dose Seasonal PF 04/17/2017, 04/16/2018   Influenza,inj,Quad PF,6+ Mos 04/02/2013, 04/07/2014, 03/15/2015, 02/03/2016   Moderna Sars-Covid-2 Vaccination 12/03/2019, 01/02/2020   Pneumococcal Conjugate-13 01/11/2015   Pneumococcal Polysaccharide-23 01/14/2017   Tdap 01/08/2020    Shingrix Completed?: No.    Education has been provided regarding the importance of this vaccine. Patient has been advised to call insurance company to determine out of pocket expense if they have not yet received this vaccine. Advised may also receive vaccine at local pharmacy or Health Dept. Verbalized acceptance and understanding.  Screening Tests Health Maintenance  Topic Date Due   Zoster Vaccines- Shingrix (1 of 2) Never done   MAMMOGRAM  02/07/2019   COVID-19 Vaccine (  3 - Moderna risk series) 12/14/2021 (Originally 01/30/2020)   INFLUENZA VACCINE  01/09/2022   COLONOSCOPY (Pts 45-89yrs Insurance coverage will need to be confirmed)  03/21/2026   TETANUS/TDAP  01/07/2030   Pneumonia Vaccine 77+ Years old  Completed   DEXA SCAN  Completed   Hepatitis C Screening  Completed   HPV VACCINES  Aged Out   Health Maintenance Health Maintenance Due  Topic Date Due   Zoster Vaccines- Shingrix (1 of 2) Never done   MAMMOGRAM  02/07/2019   DG Chest 2 View: completed 11/20/21.  Vision Screening: Recommended annual ophthalmology exams for early detection of glaucoma and other disorders of the eye.  Dental Screening: Recommended annual dental exams for proper oral hygiene  Community Resource Referral / Chronic Care Management: CRR required this visit?  No   CCM required this visit?  No      Plan:     I have personally reviewed and noted the following in the patient's chart:   Medical and social history Use of alcohol, tobacco or illicit drugs  Current medications and supplements including opioid prescriptions.  Functional ability and  status Nutritional status Physical activity Advanced directives List of other physicians Hospitalizations, surgeries, and ER visits in previous 12 months Vitals Screenings to include cognitive, depression, and falls Referrals and appointments  In addition, I have reviewed and discussed with patient certain preventive protocols, quality metrics, and best practice recommendations. A written personalized care plan for preventive services as well as general preventive health recommendations were provided to patient.     Varney Biles, LPN   [id number]

## 2021-11-30 ENCOUNTER — Telehealth: Payer: Self-pay

## 2021-11-30 NOTE — Telephone Encounter (Signed)
LMTCB for lab results.  

## 2021-12-01 ENCOUNTER — Other Ambulatory Visit: Payer: Self-pay

## 2021-12-01 LAB — URINE CULTURE
MICRO NUMBER:: 13553895
SPECIMEN QUALITY:: ADEQUATE

## 2021-12-01 MED ORDER — AMOXICILLIN-POT CLAVULANATE 875-125 MG PO TABS: ORAL_TABLET | ORAL | 0 refills | Status: DC

## 2021-12-02 ENCOUNTER — Other Ambulatory Visit: Payer: Self-pay | Admitting: Internal Medicine

## 2021-12-02 ENCOUNTER — Encounter: Payer: Self-pay | Admitting: Internal Medicine

## 2021-12-02 DIAGNOSIS — R319 Hematuria, unspecified: Secondary | ICD-10-CM

## 2021-12-02 NOTE — Assessment & Plan Note (Signed)
Off amlodipine.  Blood pressure doing well. No dizziness now.  Has lasix to take prn.  Follow pressures.

## 2021-12-02 NOTE — Assessment & Plan Note (Signed)
Evaluated by [facility].  Continue crestor.

## 2021-12-02 NOTE — Assessment & Plan Note (Signed)
Continue celexa.  Follow. Stable.

## 2021-12-02 NOTE — Assessment & Plan Note (Signed)
-   Crestor 

## 2021-12-14 ENCOUNTER — Other Ambulatory Visit: Payer: Self-pay | Admitting: Internal Medicine

## 2021-12-14 IMAGING — CT CT ABD-PELV W/ CM
2 of 5 series · 16 of 46 positions shown, 18 images · IV contrast (APPLIED)
Comparison: 01/30/2019

CLINICAL DATA: Nausea and vomiting. Bowel obstruction. Right-sided
pain with some nausea.

EXAM:
CT ABDOMEN AND PELVIS WITH CONTRAST
TECHNIQUE: Multidetector CT imaging of the abdomen and pelvis was performed
using the standard protocol following bolus administration of
intravenous contrast.
CONTRAST:  100mL OMNIPAQUE IOHEXOL 300 MG/ML  SOLN

[Series 2: routine abd/pel with · axial · 0.98mm/px · z∈[-978,-588]mm · 13 of 88 slices shown, 15 images]
[im 5/88  soft-tissue]
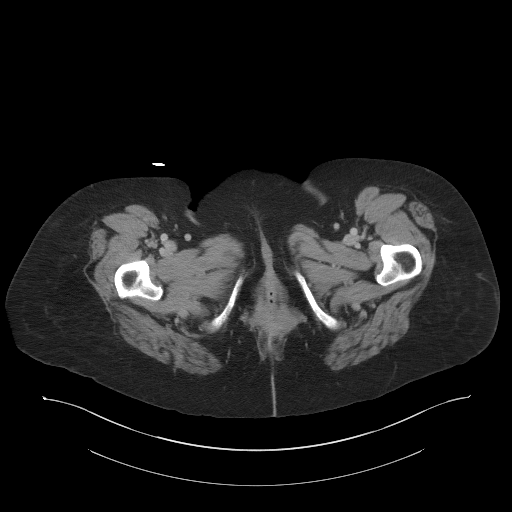
[im 5/88  bone]
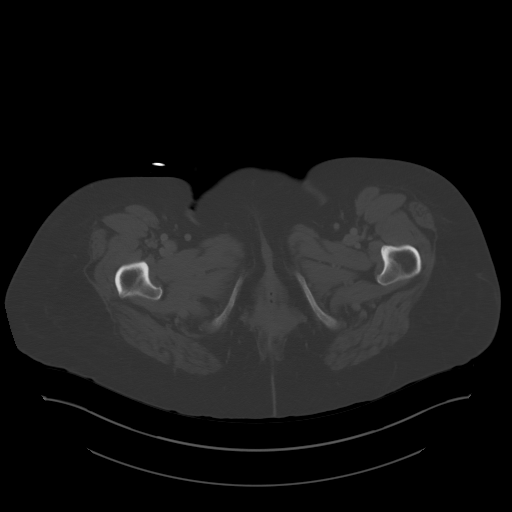
[im 14/88  soft-tissue]
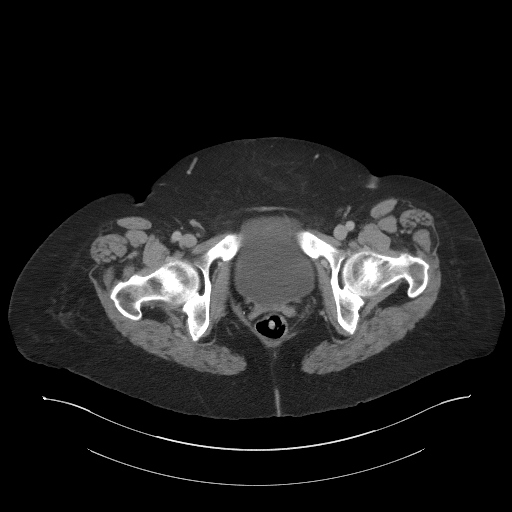
[im 19/88  soft-tissue]
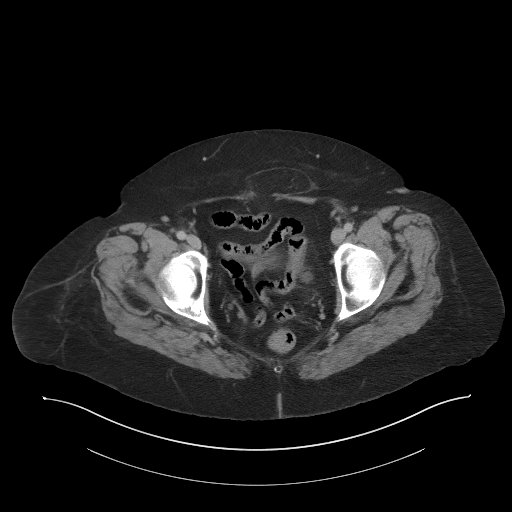
[im 23/88  soft-tissue]
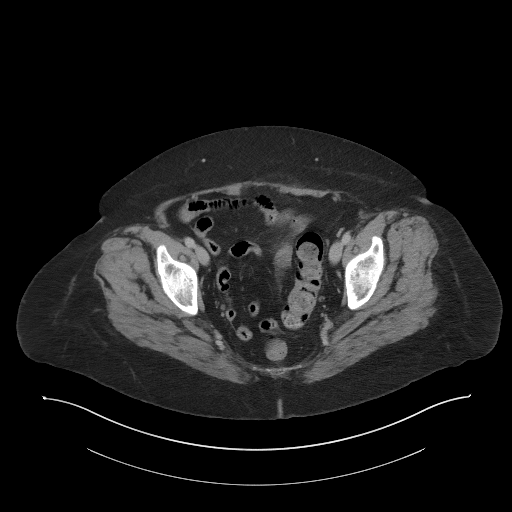
[im 33/88  soft-tissue]
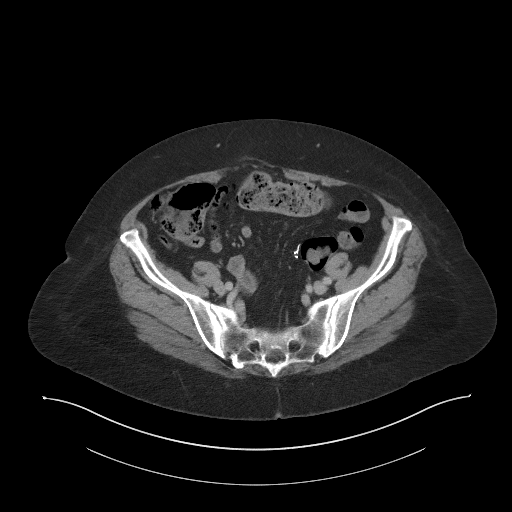
[im 37/88  soft-tissue]
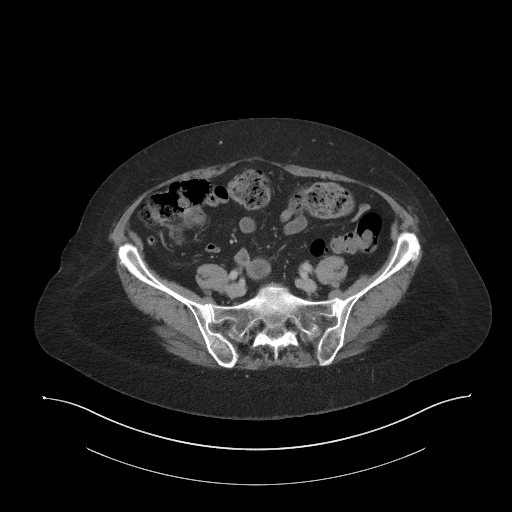
[im 46/88  soft-tissue]
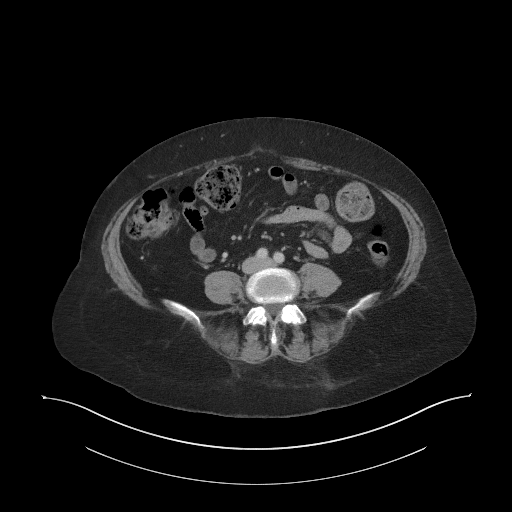
[im 51/88  soft-tissue]
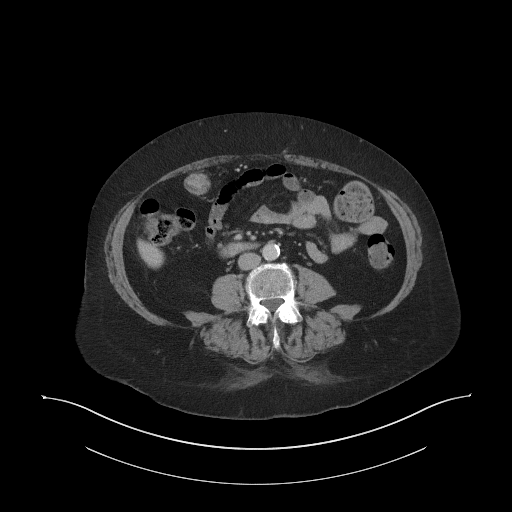
[im 55/88  soft-tissue]
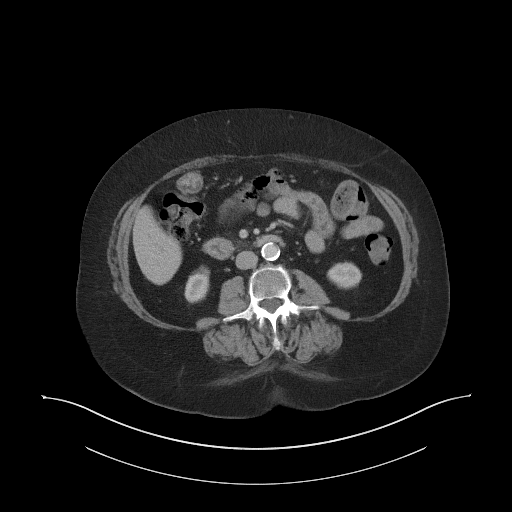
[im 55/88  bone]
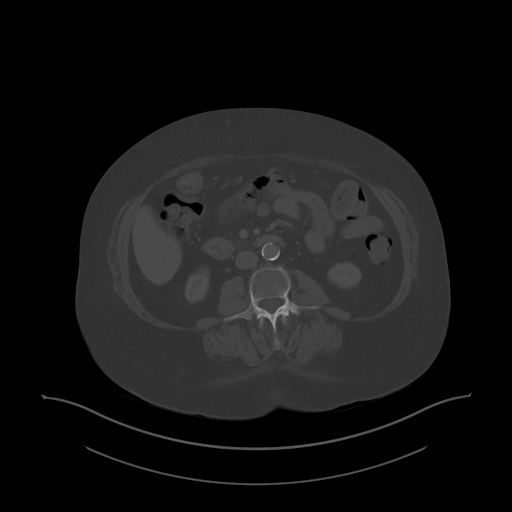
[im 65/88  soft-tissue]
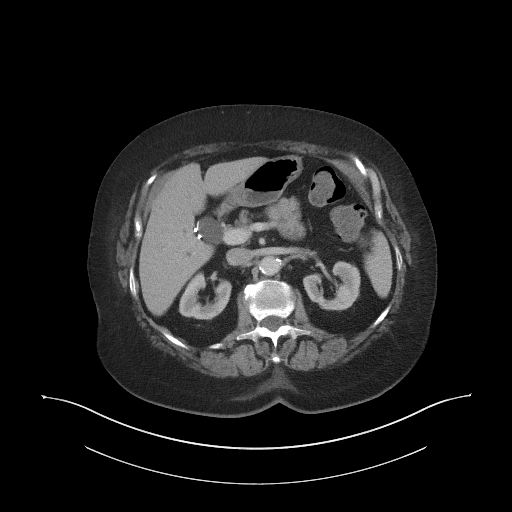
[im 69/88  soft-tissue]
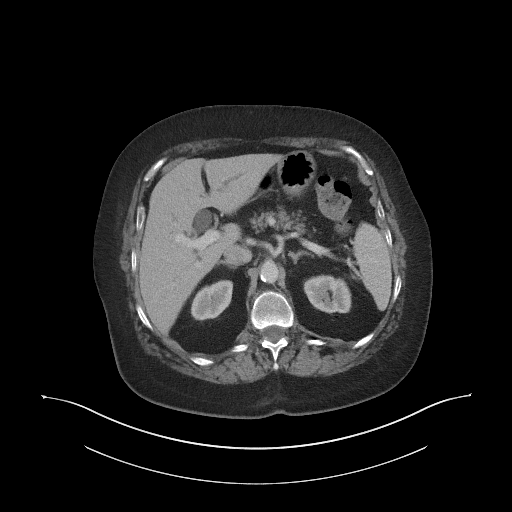
[im 74/88  soft-tissue]
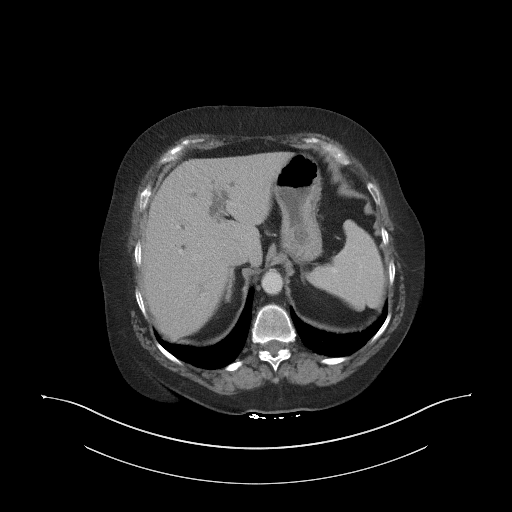
[im 83/88  soft-tissue]
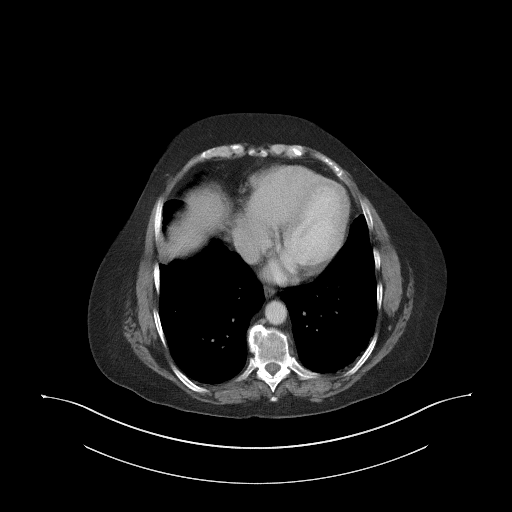

[Series 5: coronal st · coronal · 0.86mm/px · 3 of 101 slices shown]
[im 34/101  soft-tissue]
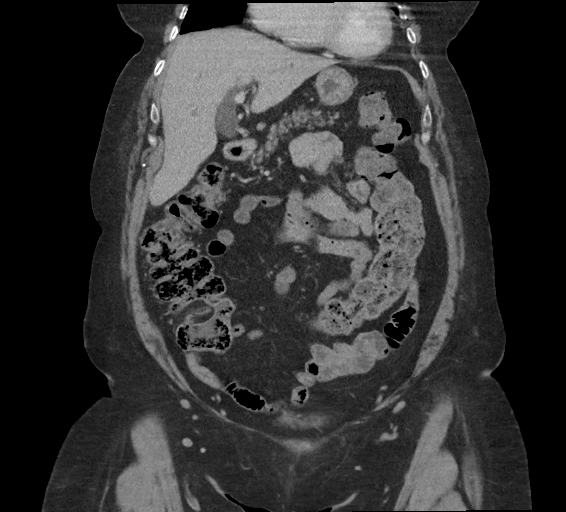
[im 45/101  soft-tissue]
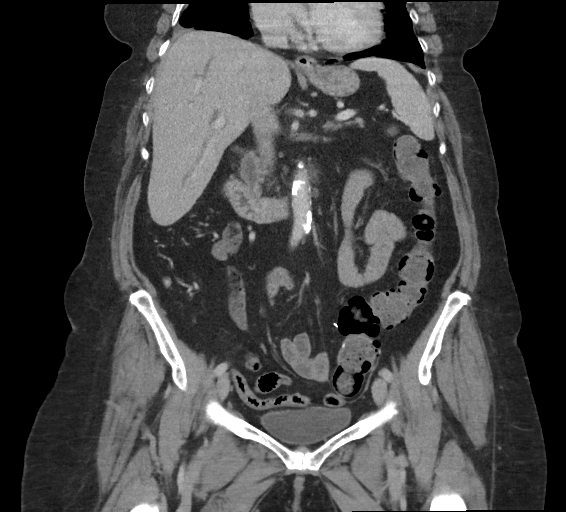
[im 56/101  soft-tissue]
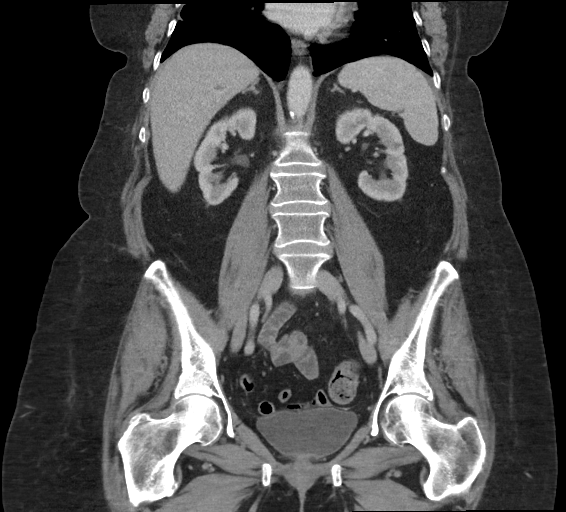

[16 of 46 positions shown; findings below may reference images not displayed]

FINDINGS: Lower chest: The lung bases are clear. The heart is enlarged.

Hepatobiliary: The liver is normal. Status post
cholecystectomy.Again noted is intrahepatic and extrahepatic biliary
ductal dilatation, slightly progressed since prior study.

Pancreas: Normal contours without ductal dilatation. No
peripancreatic fluid collection.

Spleen: Unremarkable.

Adrenals/Urinary Tract:

--Adrenal glands: Unremarkable.

--Right kidney/ureter: No hydronephrosis or radiopaque kidney
stones.

--Left kidney/ureter: No hydronephrosis or radiopaque kidney stones.

--Urinary bladder: Unremarkable.

Stomach/Bowel:

--Stomach/Duodenum: There is a small hiatal hernia.

--Small bowel: Unremarkable.

--Colon: There is a large amount of stool throughout the colon.

--Appendix: Normal.

Vascular/Lymphatic: Atherosclerotic calcification is present within
the non-aneurysmal abdominal aorta, without hemodynamically
significant stenosis.

--No retroperitoneal lymphadenopathy.

--No mesenteric lymphadenopathy.

--No pelvic or inguinal lymphadenopathy.

Reproductive: Status post hysterectomy. No adnexal mass.

Other: No ascites or free air. Multiple fat containing ventral wall
hernias are again noted.

Musculoskeletal. No acute displaced fractures.
IMPRESSION: 1. No acute abdominopelvic abnormality.
2. Large amount of stool throughout the colon.
3. Small hiatal hernia.
4. Persistent intrahepatic and extrahepatic biliary ductal
dilatation. Correlation with laboratory studies is recommended.

Aortic Atherosclerosis (69HV2-FW1.1).

## 2021-12-14 NOTE — Telephone Encounter (Signed)
Rx ok'd for alprazolam #30 with no refills.

## 2021-12-15 ENCOUNTER — Other Ambulatory Visit: Payer: Medicare Other

## 2021-12-22 DIAGNOSIS — C3490 Malignant neoplasm of unspecified part of unspecified bronchus or lung: Secondary | ICD-10-CM | POA: Diagnosis not present

## 2021-12-22 DIAGNOSIS — J449 Chronic obstructive pulmonary disease, unspecified: Secondary | ICD-10-CM | POA: Diagnosis not present

## 2022-01-08 DIAGNOSIS — G20 Parkinson's disease: Secondary | ICD-10-CM | POA: Diagnosis not present

## 2022-01-08 DIAGNOSIS — J449 Chronic obstructive pulmonary disease, unspecified: Secondary | ICD-10-CM | POA: Diagnosis not present

## 2022-01-08 DIAGNOSIS — R600 Localized edema: Secondary | ICD-10-CM | POA: Diagnosis not present

## 2022-01-08 DIAGNOSIS — R0609 Other forms of dyspnea: Secondary | ICD-10-CM | POA: Diagnosis not present

## 2022-01-16 ENCOUNTER — Telehealth: Payer: Self-pay | Admitting: Internal Medicine

## 2022-01-16 NOTE — Telephone Encounter (Signed)
Agree with need for evaluation given level of blood pressure.  Per note, has appt with me in the morning.  If blood pressure remains elevated above 200 or any acute change, needs evaluation.

## 2022-01-16 NOTE — Telephone Encounter (Signed)
Pt called stating her BP is high and it want come back down. Pt stated last night it was 200/112. Sent to access nurse

## 2022-01-16 NOTE — Telephone Encounter (Signed)
S/w pt - experiencing elevated bp - stated last night was 200/112, this am 184/100. Pt denies having chest pain, dizziness, light headedness, sob. Pt sounded short of breath on phone - but stated it was because she was putting clothes in the washing machine and ran to get the phone - stated she is not short of breath. Pt stated she was told by access nurse to go to ER - pt refusing. Stated she has been there before for this issue, and has been told nothing is wrong with her. Pt advised to at least go to [facility] to be evaluated and make sure nothing acute going, pt refusing.  States has made an appointment to see you at 730 tomorrow morning, prefers to wait and see you.

## 2022-01-17 ENCOUNTER — Ambulatory Visit: Payer: Medicare Other | Admitting: Internal Medicine

## 2022-01-22 ENCOUNTER — Other Ambulatory Visit: Payer: Self-pay | Admitting: Internal Medicine

## 2022-01-22 DIAGNOSIS — J449 Chronic obstructive pulmonary disease, unspecified: Secondary | ICD-10-CM | POA: Diagnosis not present

## 2022-01-22 DIAGNOSIS — C3490 Malignant neoplasm of unspecified part of unspecified bronchus or lung: Secondary | ICD-10-CM | POA: Diagnosis not present

## 2022-01-30 ENCOUNTER — Encounter: Payer: Self-pay | Admitting: Internal Medicine

## 2022-01-30 ENCOUNTER — Ambulatory Visit (INDEPENDENT_AMBULATORY_CARE_PROVIDER_SITE_OTHER): Payer: Medicare Other | Admitting: Internal Medicine

## 2022-01-30 ENCOUNTER — Ambulatory Visit
Admission: RE | Admit: 2022-01-30 | Discharge: 2022-01-30 | Disposition: A | Discharge status: Home / Self Care | Payer: Medicare Other | Source: Ambulatory Visit | Attending: Internal Medicine | Admitting: Internal Medicine

## 2022-01-30 VITALS — BP 137/88 | HR 67 | Temp 98.0°F | Resp 17 | Ht 64.0 in | Wt 182.4 lb

## 2022-01-30 DIAGNOSIS — K219 Gastro-esophageal reflux disease without esophagitis: Secondary | ICD-10-CM | POA: Diagnosis not present

## 2022-01-30 DIAGNOSIS — I10 Essential (primary) hypertension: Secondary | ICD-10-CM | POA: Diagnosis not present

## 2022-01-30 DIAGNOSIS — R198 Other specified symptoms and signs involving the digestive system and abdomen: Secondary | ICD-10-CM

## 2022-01-30 DIAGNOSIS — N183 Chronic kidney disease, stage 3 unspecified: Secondary | ICD-10-CM | POA: Diagnosis not present

## 2022-01-30 DIAGNOSIS — I773 Arterial fibromuscular dysplasia: Secondary | ICD-10-CM

## 2022-01-30 DIAGNOSIS — R2242 Localized swelling, mass and lump, left lower limb: Secondary | ICD-10-CM | POA: Diagnosis not present

## 2022-01-30 DIAGNOSIS — G903 Multi-system degeneration of the autonomic nervous system: Secondary | ICD-10-CM

## 2022-01-30 DIAGNOSIS — I700 Atherosclerosis of aorta: Secondary | ICD-10-CM

## 2022-01-30 DIAGNOSIS — K760 Fatty (change of) liver, not elsewhere classified: Secondary | ICD-10-CM

## 2022-01-30 DIAGNOSIS — M7989 Other specified soft tissue disorders: Secondary | ICD-10-CM | POA: Diagnosis not present

## 2022-01-30 DIAGNOSIS — R600 Localized edema: Secondary | ICD-10-CM | POA: Diagnosis not present

## 2022-01-30 DIAGNOSIS — I779 Disorder of arteries and arterioles, unspecified: Secondary | ICD-10-CM | POA: Diagnosis not present

## 2022-01-30 DIAGNOSIS — D649 Anemia, unspecified: Secondary | ICD-10-CM

## 2022-01-30 DIAGNOSIS — R0602 Shortness of breath: Secondary | ICD-10-CM

## 2022-01-30 DIAGNOSIS — R739 Hyperglycemia, unspecified: Secondary | ICD-10-CM

## 2022-01-30 DIAGNOSIS — Z8589 Personal history of malignant neoplasm of other organs and systems: Secondary | ICD-10-CM

## 2022-01-30 DIAGNOSIS — Z96652 Presence of left artificial knee joint: Secondary | ICD-10-CM

## 2022-01-30 DIAGNOSIS — E7800 Pure hypercholesterolemia, unspecified: Secondary | ICD-10-CM

## 2022-01-30 MED ORDER — AMLODIPINE BESYLATE 2.5 MG PO TABS: 2.5000 mg | ORAL_TABLET | Freq: Two times a day (BID) | ORAL | 1 refills | Status: DC

## 2022-01-30 NOTE — Patient Instructions (Signed)
Increase amlodipine to 2.23m twice a day  Stop lasix daily.  You will have to take as needed.    Use advair twice a day  Benefiber - daily.

## 2022-01-30 NOTE — Progress Notes (Signed)
Patient ID: Victoria Hanson, female   DOB: Jan 07, 1951, 71 y.o.   MRN: [id number]   Subjective:    Patient ID: Victoria Hanson, female    DOB: 1951-04-04, 71 y.o.   MRN: [id number]   Patient here for a scheduled follow up.   Chief Complaint  Patient presents with   Hypertension   .   HPI Recently saw Dr Raul Del - f/u dyspnea.  Last cxr revealed pleural effusion resolved.  Recommended continue advair, albuterol prn, prilosec, flonase, claritin and nasal rinses.  Had echo - 02/2021 - EF 60-65%, mildly elevate pulmonary pressure, mild/mod MR.  Reports her blood pressure has been varying.  Was low.  Medication adjsuted/stopped.  Recently has had elevated blood pressure.  Back on amlodipine 2.$RemoveBeforeD'5mg'OejWDYKfKcgqas$  q day.  Also just restarted lasix daily.  Breathing overall stable.  No increased cough or congestion.  No abdominal pain.  Bowels stable.     Past Medical History:  Diagnosis Date   Anxiety    Asthma    Atypical chest pain    a. 2003 Cath: reportedly nl per pt Victoria Hanson);  b. 04/2015 [facility]mission, neg troponin.   Colon cancer ([place]) 1999   a. 1999: Pt had partial colectomy. Did develop lung metastasis. Had right upper lobectomy in 01/2007 then had associated chemotherapy.   COPD (chronic obstructive pulmonary disease) (HCC)    DDD (degenerative disc disease), lumbosacral    Depression    Echocardiogram abnormal    a. 02/2010 Echo: EF 55-60%, mild LAE, no regional wall motion abnormalities   Elevated transaminase level    a. ? NASH;  b. 06/2015 - nl LFTs.   GERD (gastroesophageal reflux disease)    Hematuria    Holter monitor, abnormal    a. 12/2009 Holter monitoring: NSR, rare PACs and PVCs.   Hypercholesterolemia    Hyperlipidemia    Hypertension    Leaky heart valve    a. Per pt report - no evidence of valvular abnormalities on prior echoes.   Lesion of skin of Right breast 10/20/2017   Lung metastases 2008   a. S/P resection (lobectomy - right)   Morbid obesity ([place])    Nephrolithiasis     Parkinson disease ([place]) 08/2016   [name]   Past Surgical History:  Procedure Laterality Date   BREAST EXCISIONAL BIOPSY Right 10/07/2009   benign   CARDIAC CATHETERIZATION  2003   negative as per pt report   CHOLECYSTECTOMY  2000   COLON SURGERY     Colon cancer, removed part of large colon   COLONOSCOPY N/A 04/25/2015   Procedure: COLONOSCOPY;  Surgeon: Manya Silvas, MD;  Location: [facility];  Service: Endoscopy;  Laterality: N/A;   COLONOSCOPY WITH PROPOFOL N/A 06/14/2017   Procedure: COLONOSCOPY WITH PROPOFOL;  Surgeon: Manya Silvas, MD;  Location[facility] ENDOSCOPY;  Service: Endoscopy;  Laterality: N/A;   COLONOSCOPY WITH PROPOFOL N/A 11/17/2019   Procedure: COLONOSCOPY WITH PROPOFOL;  Surgeon: [name], MD;  Location: [facility] ENDOSCOPY;  Service: Gastroenterology;  Laterality: N/A;   COLONOSCOPY WITH PROPOFOL N/A 03/21/2021   Procedure: COLONOSCOPY WITH PROPOFOL;  Surgeon: Lesly Rubenstein, MD;  Location: [facility] ENDOSCOPY;  Service: Endoscopy;  Laterality: N/A;   ESOPHAGOGASTRODUODENOSCOPY (EGD) WITH PROPOFOL N/A 03/21/2021   Procedure: ESOPHAGOGASTRODUODENOSCOPY (EGD) WITH PROPOFOL;  Surgeon: Lesly Rubenstein, MD;  Location: [facility] ENDOSCOPY;  Service: Endoscopy;  Laterality: N/A;   LOBECTOMY  01/2007   Right lung, upper lobe right side removed   TOTAL ABDOMINAL HYSTERECTOMY  abnormal bleeding   TUBAL LIGATION  1980   Family History  Problem Relation Age of Onset   Coronary artery disease Mother        died @ 20 of MI.   Mental illness Mother    Diabetes Mother    Heart attack Father 82       MI   Cancer Father        lung - died in his mid-70's   Hypertension Father    Diabetes Father    Sudden death Brother    Mental illness Brother    Heart attack Brother        MI @ 75, died @ age 62.   Breast cancer Neg Hx    Social History   Socioeconomic History   Marital status: Widowed    Spouse name: Not on file   Number of children: Not on file    Years of education: Not on file   Highest education level: Not on file  Occupational History   Not on file  Tobacco Use   Smoking status: Never    Passive exposure: Yes   Smokeless tobacco: Never   Tobacco comments:    husband and son smoked in her home.  Vaping Use   Vaping Use: Never used  Substance and Sexual Activity   Alcohol use: Not Currently    Alcohol/week: 0.0 standard drinks of alcohol   Drug use: No   Sexual activity: Never  Other Topics Concern   Not on file  Social History Narrative   Widowed.   Disabled (colon/lung CA), retired.   Lives in [place] with her dtr, grand-dtr, and son.   Active.   Gets regular exercise, walks.   Social Determinants of Health   Financial Resource Strain: Low Risk  (11/29/2021)   Overall Financial Resource Strain (CARDIA)    Difficulty of Paying Living Expenses: Not hard at all  Food Insecurity: No Food Insecurity (11/29/2021)   Hunger Vital Sign    Worried About Running Out of Food in the Last Year: Never true    Ran Out of Food in the Last Year: Never true  Transportation Needs: No Transportation Needs (11/29/2021)   PRAPARE - Hydrologist (Medical): No    Lack of Transportation (Non-Medical): No  Physical Activity: Not on file  Stress: No Stress Concern Present (11/29/2021)   [place]    Feeling of Stress : Only a little  Social Connections: Unknown (11/29/2021)   Social Connection and Isolation Panel [NHANES]    Frequency of Communication with Friends and Family: More than three times a week    Frequency of Social Gatherings with Friends and Family: More than three times a week    Attends Religious Services: Not on Advertising copywriter or Organizations: Not on file    Attends Archivist Meetings: Not on file    Marital Status: Not on file     Review of Systems  Constitutional:  Negative for appetite  change and unexpected weight change.  HENT:  Negative for congestion and sinus pressure.   Respiratory:  Negative for cough and chest tightness.        Breathing stable.   Cardiovascular:  Negative for chest pain and palpitations.       Reports some ankle swelling - at times.   Gastrointestinal:  Negative for abdominal pain, diarrhea, nausea and vomiting.  Genitourinary:  Negative for difficulty urinating and dysuria.  Musculoskeletal:  Negative for joint swelling and myalgias.  Skin:  Negative for color change and rash.  Neurological:  Negative for dizziness, light-headedness and headaches.  Psychiatric/Behavioral:  Negative for agitation and dysphoric mood.        Objective:     BP 137/88 (BP Location: Left Arm, Patient Position: Sitting, Cuff Size: Large)   Pulse 67   Temp 98 F (36.7 C) (Temporal)   Resp 17   Ht $R'5\' 4"'Fj$  (1.626 m)   Wt 182 lb 6.4 oz (82.7 kg)   SpO2 98%   BMI 31.31 kg/m  Wt Readings from Last 3 Encounters:  01/30/22 182 lb 6.4 oz (82.7 kg)  11/29/21 182 lb 3.2 oz (82.6 kg)  11/29/21 182 lb 3.2 oz (82.6 kg)    Physical Exam Vitals reviewed.  Constitutional:      General: She is not in acute distress.    Appearance: Normal appearance.  HENT:     Head: Normocephalic and atraumatic.     Right Ear: External ear normal.     Left Ear: External ear normal.  Eyes:     General: No scleral icterus.       Right eye: No discharge.        Left eye: No discharge.     Conjunctiva/sclera: Conjunctivae normal.  Neck:     Thyroid: No thyromegaly.  Cardiovascular:     Rate and Rhythm: Normal rate and regular rhythm.  Pulmonary:     Effort: No respiratory distress.     Breath sounds: Normal breath sounds. No wheezing.  Abdominal:     General: Bowel sounds are normal.     Palpations: Abdomen is soft.     Tenderness: There is no abdominal tenderness.  Musculoskeletal:        General: No swelling or tenderness.     Cervical back: Neck supple. No tenderness.   Lymphadenopathy:     Cervical: No cervical adenopathy.  Skin:    Findings: No erythema or rash.  Neurological:     Mental Status: She is alert.  Psychiatric:        Mood and Affect: Mood normal.        Behavior: Behavior normal.      Outpatient Encounter Medications as of 01/30/2022  Medication Sig   ALPRAZolam (XANAX) 0.5 MG tablet TAKE 1/2 TABLET BY MOUTH ONCE DAILY AS NEEDED   amLODipine (NORVASC) 2.5 MG tablet Take 1 tablet (2.5 mg total) by mouth in the morning and at bedtime.   amoxicillin-clavulanate (AUGMENTIN) 875-125 MG tablet Take one tablet by mouth twice daily for 5 days.   aspirin EC 81 MG tablet Take 81 mg by mouth as needed.    calcium-vitamin D (OSCAL WITH D) 500-200 MG-UNIT tablet Take 1 tablet by mouth.   carbidopa-levodopa (SINEMET) 25-100 MG tablet Take 1.5 in am and one at 11:00 and one in afternoon and 1.5 at night.   citalopram (CELEXA) 10 MG tablet TAKE ONE AND ONE-HALF TABLET BY MOUTH DAILY   fluticasone (FLONASE) 50 MCG/ACT nasal spray Place 2 sprays into both nostrils daily.   fluticasone-salmeterol (ADVAIR HFA) 115-21 MCG/ACT inhaler Inhale 2 puffs into the lungs 2 (two) times daily.   furosemide (LASIX) 20 MG tablet Take 1 tablet (20 mg total) by mouth daily as needed for edema.   montelukast (SINGULAIR) 10 MG tablet TAKE 1 TABLET BY MOUTH AT BEDTIME   omeprazole (PRILOSEC) 20 MG capsule Take 1 capsule (20 mg total)  by mouth daily.   OXYGEN Inhale into the lungs. PRN   PROAIR HFA 108 (90 Base) MCG/ACT inhaler INHALE 2 PUFFS BY MOUTH INTO THE LUNGS EVERY 6 HOURS AS NEEDED FOR WHEEZING   rosuvastatin (CRESTOR) 5 MG tablet TAKE 1 TABLET BY MOUTH EVERY MONDAY, WEDNESDAY AND FRIDAY   traZODone (DESYREL) 50 MG tablet TAKE 2 TABLETS BY MOUTH AT BEDTIME   vitamin B-12 (CYANOCOBALAMIN) 100 MCG tablet Take 100 mcg by mouth daily.   [DISCONTINUED] amLODipine (NORVASC) 2.5 MG tablet TAKE 1 TABLET BY MOUTH DAILY   No facility-administered encounter medications on  file as of 01/30/2022.     Lab Results  Component Value Date   WBC 10.1 01/30/2022   HGB 11.8 (L) 01/30/2022   HCT 35.2 (L) 01/30/2022   PLT 245.0 01/30/2022   GLUCOSE 82 01/30/2022   CHOL 149 10/13/2021   TRIG 100.0 10/13/2021   HDL 57.10 10/13/2021   LDLDIRECT 163.4 12/31/2012   LDLCALC 72 10/13/2021   ALT 8 11/05/2021   AST 16 11/05/2021   NA 137 01/30/2022   K 4.1 01/30/2022   CL 100 01/30/2022   CREATININE 1.13 01/30/2022   BUN 24 (H) 01/30/2022   CO2 28 01/30/2022   TSH 1.41 07/04/2021   INR 1.0 12/12/2012   HGBA1C 6.2 11/29/2021       Assessment & Plan:   Problem List Items Addressed This Visit     Anemia    Follow cbc and iron studies.       Relevant Orders   CBC with Differential/Platelet (Completed)   Aortic atherosclerosis (HCC)    Crestor.  Follow lipid panel and liver function tests.        Relevant Medications   amLODipine (NORVASC) 2.5 MG tablet   Carotid artery disease ([place])    Evaluated by [facility].  Continue crestor.        Relevant Medications   amLODipine (NORVASC) 2.5 MG tablet   Change in bowel movement    benefiber to help regulate bowels.       CKD (chronic kidney disease), stage III (HCC)    Has lasix to take prn.  Avoid antiinflammatories.  Follow met b.      Fibromuscular dysplasia ([place])    Has been evaluated by [facility].  Continue risk factor modificaiton.       Relevant Medications   amLODipine (NORVASC) 2.5 MG tablet   GERD (gastroesophageal reflux disease)    No acid reflux symptoms reported.  Prilosec.       Hepatic steatosis    Diet and exercise.  Follow liver panel.       History of malignant neoplasm metastatic to lung    S/p lobectomy.  Followed by pulmonary.  Breathing stable.       History of total knee arthroplasty    S/p left TKA.  Has done well s/p surgery.  Followed by [facility].       Hyperglycemia    Low carb diet and exercise.  Follow met b and a1c.       Hypertension    On amlodipine 2.$RemoveBeforeD'5mg'jvsMOgySkSnwWj$  q day  now.  Given increase in blood pressure, can increase to 2.$RemoveBefor'5mg'pTMitqigquST$  bid.  Will need to follow pressures.  Given variation (with high and low pressures), plans to discuss with neurology.  Check metabolic panel.       Relevant Medications   amLODipine (NORVASC) 2.5 MG tablet   Lower extremity edema    Increased swelling - left leg.  Will check lower  extremity ultrasound to confirm no DVT.  Follow.  If negative, compression hose.        Relevant Orders   Basic metabolic panel (Completed)   Orthostatic hypotension due to Parkinson's disease [facility])    She feels her parkinsons medication is causing her blood pressure to drop.  Blood pressure medication has been adjusted.  Blood pressure elevated now.  Back on amlodipine.  Discussed taking 2.5mg  bid - with elevation.  Check metabolic panel.  Hold daily lasix.        Relevant Medications   amLODipine (NORVASC) 2.5 MG tablet   Pure hypercholesterolemia    Crestor. Low cholesterol diet and exercise.  Follow lipid panel and liver function tests.       Relevant Medications   amLODipine (NORVASC) 2.5 MG tablet   Other Visit Diagnoses     Shortness of breath    -  Primary   Relevant Orders   EKG 12-Lead (Completed)   Localized swelling of left lower leg       Relevant Orders   US Venous Img Lower Unilateral Left (Completed)        Einar Pheasant, MD

## 2022-01-31 ENCOUNTER — Telehealth: Payer: Self-pay | Admitting: Internal Medicine

## 2022-01-31 LAB — BASIC METABOLIC PANEL
BUN: 24 mg/dL — ABNORMAL HIGH (ref 6–23)
CO2: 28 mEq/L (ref 19–32)
Calcium: 9.1 mg/dL (ref 8.4–10.5)
Chloride: 100 mEq/L (ref 96–112)
Creatinine, Ser: 1.13 mg/dL (ref 0.40–1.20)
GFR: 48.71 mL/min — ABNORMAL LOW (ref >60.00)
Glucose, Bld: 82 mg/dL (ref 70–99)
Potassium: 4.1 mEq/L (ref 3.5–5.1)
Sodium: 137 mEq/L (ref 135–145)

## 2022-01-31 LAB — CBC WITH DIFFERENTIAL/PLATELET
Basophils Absolute: 0.1 10*3/uL (ref 0.0–0.1)
Basophils Relative: 1.3 % (ref 0.0–3.0)
Eosinophils Absolute: 0.1 10*3/uL (ref 0.0–0.7)
Eosinophils Relative: 0.7 % (ref 0.0–5.0)
HCT: 35.2 % — ABNORMAL LOW (ref 36.0–46.0)
Hemoglobin: 11.8 g/dL — ABNORMAL LOW (ref 12.0–15.0)
Lymphocytes Relative: 17.9 % (ref 12.0–46.0)
Lymphs Abs: 1.8 10*3/uL (ref 0.7–4.0)
MCHC: 33.5 g/dL (ref 30.0–36.0)
MCV: 86.9 fl (ref 78.0–100.0)
Monocytes Absolute: 0.6 10*3/uL (ref 0.1–1.0)
Monocytes Relative: 6.4 % (ref 3.0–12.0)
Neutro Abs: 7.4 10*3/uL (ref 1.4–7.7)
Neutrophils Relative %: 73.7 % (ref 43.0–77.0)
Platelets: 245 10*3/uL (ref 150.0–400.0)
RBC: 4.05 Mil/uL (ref 3.87–5.11)
RDW: 14.6 % (ref 11.5–15.5)
WBC: 10.1 10*3/uL (ref 4.0–10.5)

## 2022-01-31 NOTE — Telephone Encounter (Signed)
Patient returned office phone call for ultrasound results.

## 2022-01-31 NOTE — Telephone Encounter (Signed)
Pt advised of results. 

## 2022-02-02 ENCOUNTER — Telehealth: Payer: Self-pay

## 2022-02-02 ENCOUNTER — Telehealth: Payer: Self-pay | Admitting: Internal Medicine

## 2022-02-02 NOTE — Telephone Encounter (Signed)
-----   Message from Einar Pheasant, MD sent at 02/02/2022  4:08 AM EDT ----- Previously concerned regarding blood pressure being elevated.  Have her spot check her pressure.  If increase, will need to restart amlodipine 2.5mg  q day.  Also, recommend a f/u with neurology to discuss her medication concerns (dropping her blood pressure0

## 2022-02-02 NOTE — Telephone Encounter (Signed)
I called pt about mammo order/ pt states her BP has dropped with one medication. Pt states its back up. Please advise and Thank you!  Pharmacy is  [facility], [place] Phone:  [phone]  Fax:  [phone]     Call pt @ [phone]

## 2022-02-02 NOTE — Telephone Encounter (Signed)
Attempted to call Patient- no answer/voicemail

## 2022-02-05 ENCOUNTER — Encounter: Payer: Self-pay | Admitting: Internal Medicine

## 2022-02-05 DIAGNOSIS — R198 Other specified symptoms and signs involving the digestive system and abdomen: Secondary | ICD-10-CM | POA: Insufficient documentation

## 2022-02-05 NOTE — Assessment & Plan Note (Signed)
Has lasix to take prn.  Avoid antiinflammatories.  Follow met b.

## 2022-02-05 NOTE — Assessment & Plan Note (Signed)
Increased swelling - left leg.  Will check lower extremity ultrasound to confirm no DVT.  Follow.  If negative, compression hose.

## 2022-02-05 NOTE — Assessment & Plan Note (Signed)
No acid reflux symptoms reported.  Prilosec.

## 2022-02-05 NOTE — Assessment & Plan Note (Signed)
benefiber to help regulate bowels.

## 2022-02-05 NOTE — Assessment & Plan Note (Signed)
Crestor.  Follow lipid panel and liver function tests.

## 2022-02-05 NOTE — Assessment & Plan Note (Signed)
Diet and exercise.  Follow liver panel.

## 2022-02-05 NOTE — Assessment & Plan Note (Signed)
She feels her parkinsons medication is causing her blood pressure to drop.  Blood pressure medication has been adjusted.  Blood pressure elevated now.  Back on amlodipine.  Discussed taking 2.5mg  bid - with elevation.  Check metabolic panel.  Hold daily lasix.

## 2022-02-05 NOTE — Assessment & Plan Note (Signed)
Evaluated by [facility].  Continue crestor.

## 2022-02-05 NOTE — Assessment & Plan Note (Signed)
Follow cbc and iron studies.  

## 2022-02-05 NOTE — Assessment & Plan Note (Signed)
S/p lobectomy.  Followed by pulmonary.  Breathing stable.

## 2022-02-05 NOTE — Assessment & Plan Note (Signed)
On amlodipine 2.5mg  q day now.  Given increase in blood pressure, can increase to 2.5mg  bid.  Will need to follow pressures.  Given variation (with high and low pressures), plans to discuss with neurology.  Check metabolic panel.

## 2022-02-05 NOTE — Assessment & Plan Note (Signed)
Has been evaluated by [facility].  Continue risk factor modificaiton.

## 2022-02-05 NOTE — Assessment & Plan Note (Signed)
S/p left TKA.  Has done well s/p surgery.  Followed by [facility].

## 2022-02-05 NOTE — Assessment & Plan Note (Signed)
Low carb diet and exercise.  Follow met b and a1c.  

## 2022-02-05 NOTE — Assessment & Plan Note (Signed)
Crestor. Low cholesterol diet and exercise.  Follow lipid panel and liver function tests.

## 2022-02-08 DIAGNOSIS — R42 Dizziness and giddiness: Secondary | ICD-10-CM | POA: Diagnosis not present

## 2022-02-08 DIAGNOSIS — G4700 Insomnia, unspecified: Secondary | ICD-10-CM | POA: Diagnosis not present

## 2022-02-08 DIAGNOSIS — G4752 REM sleep behavior disorder: Secondary | ICD-10-CM | POA: Diagnosis not present

## 2022-02-08 DIAGNOSIS — G20 Parkinson's disease: Secondary | ICD-10-CM | POA: Diagnosis not present

## 2022-02-09 ENCOUNTER — Telehealth: Payer: Self-pay

## 2022-02-09 ENCOUNTER — Other Ambulatory Visit: Payer: Self-pay

## 2022-02-09 MED ORDER — AMLODIPINE BESYLATE 5 MG PO TABS: 5.0000 mg | ORAL_TABLET | Freq: Every day | ORAL | 3 refills | Status: DC

## 2022-02-09 NOTE — Telephone Encounter (Signed)
If no acute problems, she can notify her to increase amlodipine to 5mg  q day.  She can take two of the 2.5mg  q day, but will need new rx sent in to pharmacy. Monitor blood pressure and let us know if any problems.

## 2022-02-09 NOTE — Telephone Encounter (Signed)
I called patient & left her detailed message per request of instructions on what to take. I also asked that she monitor BP's. I will also send in amlodipine 5 mg to her pharmacy as well. I asked that she please call us to let us know that she got our message.

## 2022-02-09 NOTE — Telephone Encounter (Signed)
I called patient as well as she stated that she has no symptoms. No HA, SOB, CP or vision changes & feels like herself this morning. She just knows that BP will continue to get higher without any higher dose or different medication.

## 2022-02-09 NOTE — Telephone Encounter (Signed)
Patient states she saw her neurologist yesterday and he took her off of her carbidopa-levodopa (SINEMET) 25-100 MG tablet because it was dropping her blood pressure too low.  Patient states her neurologist told her that Dr. Einar Pheasant will need to increase her amLODipine (NORVASC) 2.5 MG tablet to compensate.  Patient states she took her blood pressure this morning and it was 143/100, so she states it's already going up.  *Patient states her preferred pharmacy is [facility].

## 2022-02-13 NOTE — Telephone Encounter (Signed)
Pt called back and I read the message to her and she verbalized understanding 

## 2022-02-15 NOTE — Telephone Encounter (Signed)
Patient called and stated she is taking the amLODipine (NORVASC) 2.5 MG tablet, 2 at a time. Her pharmacy told her that her insurance would not cover 5mg . Her BP is still running high. This morning it was 150/101. Medication is not working.

## 2022-02-15 NOTE — Telephone Encounter (Signed)
Called patient and did not get an answer, LVM to call back to office

## 2022-02-15 NOTE — Telephone Encounter (Signed)
Please confirm no other acute symptoms.  Agree with need for evaluation if persistent elevation.  If she is currently taking a total of 5mg  per day of amlodipine, she can increase to 10mg  q day or 5mg  bid.  Any acute change or problems, she is to be evaluated.  Can confirm if bp has improved.

## 2022-02-15 NOTE — Telephone Encounter (Signed)
Patient called back. BP still elevated . I scheduled her an appointment with Dr. Olivia Mackie @ 11 on 02/16/22.

## 2022-02-16 ENCOUNTER — Encounter: Payer: Self-pay | Admitting: Internal Medicine

## 2022-02-16 ENCOUNTER — Ambulatory Visit (INDEPENDENT_AMBULATORY_CARE_PROVIDER_SITE_OTHER): Payer: Medicare Other | Admitting: Internal Medicine

## 2022-02-16 VITALS — BP 120/62 | HR 70 | Temp 97.6°F | Ht 64.0 in | Wt 182.8 lb

## 2022-02-16 DIAGNOSIS — I10 Essential (primary) hypertension: Secondary | ICD-10-CM

## 2022-02-16 DIAGNOSIS — R269 Unspecified abnormalities of gait and mobility: Secondary | ICD-10-CM | POA: Diagnosis not present

## 2022-02-16 DIAGNOSIS — G20 Parkinson's disease: Secondary | ICD-10-CM

## 2022-02-16 MED ORDER — TELMISARTAN 40 MG PO TABS: 40.0000 mg | ORAL_TABLET | Freq: Every day | ORAL | 3 refills | Status: DC

## 2022-02-16 NOTE — Patient Instructions (Addendum)
Goal blood pressure top #<140 and bottom <80  Norvasc 5 mg in am  Telmisartan 40 at dinner   Check blood pressure in the am 2 hours after norvasc  And check blood pressure before bedtime after telmisartan    [place]. 4.2 19 Google reviews Medical supply store in [place], [place] Service options: In-store shopping  Delivery Address: [address], [place], [place] [address] Hours:  Open ? Closes 4:30?PM Phone: [phone]  [facility] 4.8 12 Google reviews Medical supply store in [place], [place] Service options: In-store shopping  Delivery Address: [address], [place], [place] [address] Hours:  Open ? Closes 4?PM Phone: [phone]  Parkinson's Disease Parkinson's disease is a movement disorder. It is a long-term condition that gets worse over time. Each person with Parkinson's disease is affected differently. This condition limits a person's ability to control movements and move the body normally. The condition can range from mild to severe. Parkinson's disease tends to get worse slowly over several years. What are the causes? Parkinson's disease is caused by a loss of brain cells (neurons) that make a brain chemical called dopamine. Dopamine is needed to control movement. As the condition gets worse, more neurons that make dopamine die. This makes it hard to move or control your movements. The exact cause of the loss of neurons is not known. Genes and the environment may contribute to the cause of Parkinson's disease. What increases the risk? The following factors may make you more likely to develop this condition: Being female. Being age 56 or older. Having a family history of Parkinson's disease. Having had a traumatic brain injury. Having been exposed to toxins, such as pesticides. Having depression. What are the signs or symptoms? Symptoms of this condition can vary. The main  symptoms are related to movement. These include: A tremor or shaking while you are resting. You cannot control the shaking. Stiffness in your arms and legs (rigidity). Slowing of movement. You may lose facial expressions and have trouble making small movements that are needed to button clothing or brush your teeth. An abnormal walk. You may walk with short, shuffling steps. Loss of balance and stability when standing. You may sway, fall backward, and have trouble making turns. Other symptoms include: Mental or cognitive changes, including: Depression or anxiety. Having false beliefs (delusions). Seeing, hearing, or feeling things that do not exist (hallucinations). Trouble speaking or swallowing. Changes in bowel or bladder functions, including constipation, having to go urgently or frequently, or not being able to control your bowel or bladder. Changes in sleep habits, acting out dreams, or trouble sleeping. Depending on the severity of the symptoms, Parkinson's disease may be mild, moderate, or advanced. Parkinson's disease progression is different for everyone. Some people may not progress to the advanced stage. Mild Parkinson's disease involves: Movement problems that do not affect daily activities. Movement problems on one side of the body. Moderate Parkinson's disease involves: Movement problems on both sides of the body. Slowing of movement. Coordination and balance problems. Advanced Parkinson's disease involves: Extreme difficulty walking. Inability to live alone safely. Signs of dementia, such as having trouble remembering things, doing daily tasks such as getting dressed, and problem solving. How is this diagnosed? This condition is diagnosed by a specialist. A diagnosis may be made based on symptoms, your medical history, and a physical exam. You may also have brain imaging tests to check for loss of neurons in  the brain. How is this treated? There is no cure for Parkinson's  disease. Treatment focuses on managing your symptoms. Treatment may include: Medicines. Everyone responds to medicines differently. Your response may change over time. Work with your health care provider to find the best medicines for you. Speech, occupational, and physical therapy. Deep brain stimulation surgery to reduce tremors and other involuntary movements. Follow these instructions at home: Medicines Take over-the-counter and prescription medicines only as told by your health care provider. Avoid taking medicines that can affect thinking, such as pain or sleeping medicines. Eating and drinking Follow instructions from your health care provider about eating or drinking restrictions. Do not drink alcohol. Activity Ask your health care provider if it is safe for you to drive. Do exercises as told by your health care provider or physical therapist. Lifestyle  Install grab bars and railings in your home to prevent falls. Do not use any products that contain nicotine or tobacco. These products include cigarettes, chewing tobacco, and vaping devices, such as e-cigarettes. If you need help quitting, ask your health care provider. Consider joining a support group for people with Parkinson's disease. General instructions Work with your health care provider to know the kind of day-to-day help that you may need and what to do to stay safe. Keep all follow-up visits. This is important. Follow-up visits include any visits with a physical therapist, speech therapist, or occupational therapist. Where to find more information [employer] of Neurological Disorders and Stroke: [employer] [place]: www.parkinson.org Contact a health care provider if: Medicines do not help your symptoms. You are unsteady or have fallen at home. You need more support to function well at home. You have trouble swallowing. You have severe constipation. You are having problems with side  effects from your medicines. You feel confused, anxious, depressed, or have hallucinations. Get help right away if you: Are injured after a fall. Cannot swallow without choking. Have chest pain or trouble breathing. Do not feel safe at home. Have thoughts about hurting yourself or others. These symptoms may represent a serious problem that is an emergency. Do not wait to see if the symptoms will go away. Get medical help right away. Call your local emergency services (911 in the [place] Do not drive yourself to the hospital. If you ever feel like you may hurt yourself or others, or have thoughts about taking your own life, get help right away. Go to your nearest emergency department or: Call your local emergency services (911 in the [place] Call a suicide crisis helpline, such as the [facility] at 878-443-8005 or 988 in the [place]. This is open 24 hours a day in the [place] Text the Crisis Text Line at 501-208-7063 (in the [place].). Summary Parkinson's disease is a long-term condition that gets worse over time. This condition limits your ability to control your movements and move your body normally. There is no cure for Parkinson's disease. Treatment focuses on managing your symptoms. Work with your health care provider to know the kind of day-to-day help that you may need and what to do to stay safe. Keep all follow-up visits, including any visits with a physical therapist, speech therapist, or occupational therapist. This is important. This information is not intended to replace advice given to you by your health care provider. Make sure you discuss any questions you have with your health care provider. Document Revised: 12/21/2020 Document Reviewed: 09/12/2020 Elsevier Patient Education  [place].  Telmisartan Tablets What is this  medication? TELMISARTAN (tel mi SAR tan) treats high blood pressure. It may also be used to reduce the risk of heart attack and stroke. It  works by relaxing blood vessels, which decreases the amount of work the heart has to do. It belongs to a group of medications called ARBs. This medicine may be used for other purposes; ask your health care provider or pharmacist if you have questions. COMMON BRAND NAME(S): Micardis What should I tell my care team before I take this medication? They need to know if you have any of these conditions: Diet low in salt Kidney disease Liver disease Low levels of sodium in the blood An unusual or allergic reaction to telmisartan, other medications, foods, dyes, or preservatives Pregnant or trying to get pregnant Breast-feeding How should I use this medication? Take this medication by mouth. Take it as directed on the prescription label at the same time every day. You can take it with or without food. If it upsets your stomach, take it with food. Keep taking it unless your care team tells you to stop. Talk to your care team about the use of this medication in children. Special care may be needed. Overdosage: If you think you have taken too much of this medicine contact a poison control center or emergency room at once. NOTE: This medicine is only for you. Do not share this medicine with others. What if I miss a dose? If you miss a dose, take it as soon as you can. If it is almost time for your next dose, take only that dose. Do not take double or extra doses. What may interact with this medication? This medication may interact with the following: Certain medications for blood pressure, heart disease, irregular heart beat Diuretics Lithium NSAIDs, medications for pain and inflammation, like ibuprofen or naproxen This list may not describe all possible interactions. Give your health care provider a list of all the medicines, herbs, non-prescription drugs, or dietary supplements you use. Also tell them if you smoke, drink alcohol, or use illegal drugs. Some items may interact with your medicine. What  should I watch for while using this medication? Visit your care team for regular checks on your progress. Check your blood pressure as directed. Ask your care team what your blood pressure should be. Also find out when you should contact them. Women should inform their care team if they wish to become pregnant or think they might be pregnant. There is a potential for serious side effects and harm to an unborn child, particularly in the second or third trimester. Talk to your care team for more information. You may get dizzy. Do not drive, use machinery, or do anything that needs mental alertness until you know how this medication affects you. Do not stand or sit up quickly, especially if you are an older patient. This reduces the risk of dizzy or fainting spells. Avoid alcoholic drinks; they can make you more dizzy. Avoid salt substitutes unless you are told otherwise by your care team. Do not treat yourself for coughs, colds, or pain while you are taking this medication without asking your care team for advice. Some medications may increase your blood pressure. What side effects may I notice from receiving this medication? Side effects that you should report to your care team as soon as possible: Allergic reactions--skin rash, itching, hives, swelling of the face, lips, tongue, or throat High potassium level--muscle weakness, fast or irregular heartbeat Kidney injury--decrease in the amount of urine, swelling of  the ankles, hands, or feet Low blood pressure--dizziness, feeling faint or lightheaded, blurry vision Side effects that usually do not require medical attention (report to your care team if they continue or are bothersome): Back pain Diarrhea Dizziness Fatigue Headache Sinus pain or pressure around the face or forehead This list may not describe all possible side effects. Call your doctor for medical advice about side effects. You may report side effects to FDA at 1-800-FDA-1088. Where  should I keep my medication? Keep out of the reach of children and pets. Store at room temperature between 15 and 30 degrees C (59 and 86 degrees F). Keep this medication in the original packaging until you are ready to take it. Throw away any unused medication after the expiration date. NOTE: This sheet is a summary. It may not cover all possible information. If you have questions about this medicine, talk to your doctor, pharmacist, or health care provider.  2023 Elsevier/Gold Standard (2021-02-16 00:00:00)

## 2022-02-16 NOTE — Progress Notes (Signed)
Chief Complaint  Patient presents with   Hypertension    Pt had a reading at home this morning of 194/107 she took her meds afterwards and before coming to the appt   F/u  1. Htn elevated after sinemet stopped due to orthostasis and hypotension bp at home taking norvasc 5 mg qd (2-2.5 mg) 158/104, 142/101, 152/99, 193, 104, 147/90, 161/90  At times head throbbing with BP elevation 2. [place] f/u [facility] off sinemet see above given Rx rolling walker today    Review of Systems  Constitutional:  Negative for weight loss.  HENT:  Negative for hearing loss.   Eyes:  Negative for blurred vision.  Respiratory:  Negative for shortness of breath.   Cardiovascular:  Negative for chest pain.  Gastrointestinal:  Negative for abdominal pain and blood in stool.  Genitourinary:  Negative for dysuria.  Musculoskeletal:  Negative for falls and joint pain.  Skin:  Negative for rash.  Neurological:  Positive for headaches.  Psychiatric/Behavioral:  Negative for depression.    Past Medical History:  Diagnosis Date   Anxiety    Asthma    Atypical chest pain    a. 2003 Cath: reportedly nl per pt [name]);  b. 04/2015 [facility] Admission, neg troponin.   Colon cancer ([place]) 1999   a. 1999: Pt had partial colectomy. Did develop lung metastasis. Had right upper lobectomy in 01/2007 then had associated chemotherapy.   COPD (chronic obstructive pulmonary disease) (HCC)    DDD (degenerative disc disease), lumbosacral    Depression    Echocardiogram abnormal    a. 02/2010 Echo: EF 55-60%, mild LAE, no regional wall motion abnormalities   Elevated transaminase level    a. ? NASH;  b. 06/2015 - nl LFTs.   GERD (gastroesophageal reflux disease)    Hematuria    Holter monitor, abnormal    a. 12/2009 Holter monitoring: NSR, rare PACs and PVCs.   Hypercholesterolemia    Hyperlipidemia    Hypertension    Leaky heart valve    a. Per pt report - no evidence of valvular abnormalities on prior echoes.   Lesion  of skin of Right breast 10/20/2017   Lung metastases 2008   a. S/P resection (lobectomy - right)   Morbid obesity ([place])    Nephrolithiasis    Parkinson disease ([place]) 08/2016   [name]   Past Surgical History:  Procedure Laterality Date   BREAST EXCISIONAL BIOPSY Right 10/07/2009   benign   CARDIAC CATHETERIZATION  2003   negative as per pt report   CHOLECYSTECTOMY  2000   COLON SURGERY     Colon cancer, removed part of large colon   COLONOSCOPY N/A 04/25/2015   Procedure: COLONOSCOPY;  Surgeon: Manya Silvas, MD;  Location: [facility];  Service: Endoscopy;  Laterality: N/A;   COLONOSCOPY WITH PROPOFOL N/A 06/14/2017   Procedure: COLONOSCOPY WITH PROPOFOL;  Surgeon: Manya Silvas, MD;  Location: [facility] ENDOSCOPY;  Service: Endoscopy;  Laterality: N/A;   COLONOSCOPY WITH PROPOFOL N/A 11/17/2019   Procedure: COLONOSCOPY WITH PROPOFOL;  Surgeon: [name], MD;  Location: [facility] ENDOSCOPY;  Service: Gastroenterology;  Laterality: N/A;   COLONOSCOPY WITH PROPOFOL N/A 03/21/2021   Procedure: COLONOSCOPY WITH PROPOFOL;  Surgeon: Lesly Rubenstein, MD;  Location: [facility] ENDOSCOPY;  Service: Endoscopy;  Laterality: N/A;   ESOPHAGOGASTRODUODENOSCOPY (EGD) WITH PROPOFOL N/A 03/21/2021   Procedure: ESOPHAGOGASTRODUODENOSCOPY (EGD) WITH PROPOFOL;  Surgeon: Lesly Rubenstein, MD;  Location: [facility] ENDOSCOPY;  Service: Endoscopy;  Laterality: N/A;   LOBECTOMY  01/2007   Right lung, upper lobe right side removed   TOTAL ABDOMINAL HYSTERECTOMY     abnormal bleeding   TUBAL LIGATION  1980   Family History  Problem Relation Age of Onset   Coronary artery disease Mother        died @ 88 of MI.   Mental illness Mother    Diabetes Mother    Heart attack Father 74       MI   Cancer Father        lung - died in his mid-70's   Hypertension Father    Diabetes Father    Sudden death Brother    Mental illness Brother    Heart attack Brother        MI @ 39, died @ age 37.   Breast cancer Neg  Hx    Social History   Socioeconomic History   Marital status: Widowed    Spouse name: Not on file   Number of children: Not on file   Years of education: Not on file   Highest education level: Not on file  Occupational History   Not on file  Tobacco Use   Smoking status: Never    Passive exposure: Yes   Smokeless tobacco: Never   Tobacco comments:    husband and son smoked in her home.  Vaping Use   Vaping Use: Never used  Substance and Sexual Activity   Alcohol use: Not Currently    Alcohol/week: 0.0 standard drinks of alcohol   Drug use: No   Sexual activity: Never  Other Topics Concern   Not on file  Social History Narrative   Widowed.   Disabled (colon/lung CA), retired.   Lives in [place] with her dtr, grand-dtr, and son.   Active.   Gets regular exercise, walks.   Social Determinants of Health   Financial Resource Strain: Low Risk  (11/29/2021)   Overall Financial Resource Strain (CARDIA)    Difficulty of Paying Living Expenses: Not hard at all  Food Insecurity: No Food Insecurity (11/29/2021)   Hunger Vital Sign    Worried About Running Out of Food in the Last Year: Never true    Ran Out of Food in the Last Year: Never true  Transportation Needs: No Transportation Needs (11/29/2021)   PRAPARE - Hydrologist (Medical): No    Lack of Transportation (Non-Medical): No  Physical Activity: Not on file  Stress: No Stress Concern Present (11/29/2021)   [place]    Feeling of Stress : Only a little  Social Connections: Unknown (11/29/2021)   Social Connection and Isolation Panel [NHANES]    Frequency of Communication with Friends and Family: More than three times a week    Frequency of Social Gatherings with Friends and Family: More than three times a week    Attends Religious Services: Not on file    Active Member of Clubs or Organizations: Not on file    Attends  Archivist Meetings: Not on file    Marital Status: Not on file  Intimate Partner Violence: Not At Risk (11/29/2021)   Humiliation, Afraid, Rape, and Kick questionnaire    Fear of Current or Ex-Partner: No    Emotionally Abused: No    Physically Abused: No    Sexually Abused: No   Current Meds  Medication Sig   ALPRAZolam (XANAX) 0.5 MG tablet TAKE 1/2 TABLET BY MOUTH ONCE DAILY  AS NEEDED   amLODipine (NORVASC) 5 MG tablet Take 1 tablet (5 mg total) by mouth daily.   aspirin EC 81 MG tablet Take 81 mg by mouth as needed.    calcium-vitamin D (OSCAL WITH D) 500-200 MG-UNIT tablet Take 1 tablet by mouth.   citalopram (CELEXA) 10 MG tablet TAKE ONE AND ONE-HALF TABLET BY MOUTH DAILY   fluticasone-salmeterol (ADVAIR HFA) 115-21 MCG/ACT inhaler Inhale 2 puffs into the lungs 2 (two) times daily.   furosemide (LASIX) 20 MG tablet Take 1 tablet (20 mg total) by mouth daily as needed for edema.   montelukast (SINGULAIR) 10 MG tablet TAKE 1 TABLET BY MOUTH AT BEDTIME   omeprazole (PRILOSEC) 20 MG capsule Take 1 capsule (20 mg total) by mouth daily.   OXYGEN Inhale into the lungs. PRN   PROAIR HFA 108 (90 Base) MCG/ACT inhaler INHALE 2 PUFFS BY MOUTH INTO THE LUNGS EVERY 6 HOURS AS NEEDED FOR WHEEZING   rosuvastatin (CRESTOR) 5 MG tablet TAKE 1 TABLET BY MOUTH EVERY MONDAY, WEDNESDAY AND FRIDAY   telmisartan (MICARDIS) 40 MG tablet Take 1 tablet (40 mg total) by mouth daily. Around 6 pm   traZODone (DESYREL) 50 MG tablet TAKE 2 TABLETS BY MOUTH AT BEDTIME   vitamin B-12 (CYANOCOBALAMIN) 100 MCG tablet Take 100 mcg by mouth daily.   [DISCONTINUED] carbidopa-levodopa (SINEMET) 25-100 MG tablet Take 1.5 in am and one at 11:00 and one in afternoon and 1.5 at night.   Allergies  Allergen Reactions   Macrobid [Nitrofurantoin Macrocrystal] Shortness Of Breath and Palpitations   Antihistamines, Diphenhydramine-Type Other (See Comments)    She does not know which antihistamine she has an  allergic reaction to.   Aspirin Other (See Comments)    Makes her sick on the stomach if she takes too many and causes bleeding.  Of note, she can take ibuprofen. Other reaction(s): Other (See Comments) Makes her sick on the stomach.  Of note, she can take ibuprofen. Makes her sick on the stomach.  Of note, she can take ibuprofen.   Ceftin [Cefuroxime Axetil] Other (See Comments)    Chest pains   Celecoxib Other (See Comments)    Reaction: Blood Disorder GI bleeding.   Cymbalta [Duloxetine Hcl] Other (See Comments)    Reaction: unknown   Diphenhydramine Other (See Comments)   Escitalopram Other (See Comments)   Lexapro [Escitalopram Oxalate] Other (See Comments)    Reaction: Difficulty with speech   Metronidazole Other (See Comments)    Other reaction(s): Unknown   Nitrofurantoin Other (See Comments)   Zoloft [Sertraline Hcl]    Ciprofloxacin Other (See Comments)    Reaction: unknown   Recent Results (from the past 2160 hour(s))  Basic metabolic panel     Status: Abnormal   Collection Time: 11/20/21  8:17 PM  Result Value Ref Range   Sodium 139 135 - 145 mmol/L   Potassium 4.1 3.5 - 5.1 mmol/L   Chloride 107 98 - 111 mmol/L   CO2 27 22 - 32 mmol/L   Glucose, Bld 109 (H) 70 - 99 mg/dL    Comment: Glucose reference range applies only to samples taken after fasting for at least 8 hours.   BUN 23 8 - 23 mg/dL   Creatinine, Ser 0.90 0.44 - 1.00 mg/dL   Calcium 9.2 8.9 - 10.3 mg/dL   GFR, Estimated >60 >60 mL/min    Comment: (NOTE) Calculated using the CKD-EPI Creatinine Equation (2021)    Anion gap 5 5 - 15  Comment: Performed at [facility], [address] [place], [place] [address]  CBC     Status: Abnormal   Collection Time: 11/20/21  8:17 PM  Result Value Ref Range   WBC 13.5 (H) 4.0 - 10.5 K/uL   RBC 4.00 3.87 - 5.11 MIL/uL   Hemoglobin 11.0 (L) 12.0 - 15.0 g/dL   HCT 35.8 (L) 36.0 - 46.0 %   MCV 89.5 80.0 - 100.0 fL   MCH 27.5 26.0 - 34.0 pg   MCHC  30.7 30.0 - 36.0 g/dL   RDW 13.5 11.5 - 15.5 %   Platelets 269 150 - 400 K/uL   nRBC 0.0 0.0 - 0.2 %    Comment: Performed at [facility], [address], [place] [address]  Troponin I (High Sensitivity)     Status: None   Collection Time: 11/20/21  8:17 PM  Result Value Ref Range   Troponin I (High Sensitivity) 14 <18 ng/L    Comment: (NOTE) Elevated high sensitivity troponin I (hsTnI) values and significant  changes across serial measurements may suggest ACS but many other  chronic and acute conditions are known to elevate hsTnI results.  Refer to the "Links" section for chest pain algorithms and additional  guidance. Performed at [facility], [address] [place], [place] [address]   CBC with Differential/Platelet     Status: Abnormal   Collection Time: 11/29/21 10:00 AM  Result Value Ref Range   WBC 10.7 (H) 4.0 - 10.5 K/uL   RBC 4.13 3.87 - 5.11 Mil/uL   Hemoglobin 11.7 (L) 12.0 - 15.0 g/dL   HCT 36.5 36.0 - 46.0 %   MCV 88.4 78.0 - 100.0 fl   MCHC 32.0 30.0 - 36.0 g/dL   RDW 14.6 11.5 - 15.5 %   Platelets 264.0 150.0 - 400.0 K/uL   Neutrophils Relative % 75.7 43.0 - 77.0 %   Lymphocytes Relative 15.8 12.0 - 46.0 %   Monocytes Relative 7.1 3.0 - 12.0 %   Eosinophils Relative 0.8 0.0 - 5.0 %   Basophils Relative 0.6 0.0 - 3.0 %   Neutro Abs 8.1 (H) 1.4 - 7.7 K/uL   Lymphs Abs 1.7 0.7 - 4.0 K/uL   Monocytes Absolute 0.8 0.1 - 1.0 K/uL   Eosinophils Absolute 0.1 0.0 - 0.7 K/uL   Basophils Absolute 0.1 0.0 - 0.1 K/uL  Hemoglobin A1c     Status: None   Collection Time: 11/29/21 10:00 AM  Result Value Ref Range   Hgb A1c MFr Bld 6.2 4.6 - 6.5 %    Comment: Glycemic Control Guidelines for People with Diabetes:Non Diabetic:  <6%Goal of Therapy: <7%Additional Action Suggested:  >8%   Urinalysis, Routine w reflex microscopic     Status: Abnormal   Collection Time: 11/29/21 10:00 AM  Result Value Ref Range   Color, Urine YELLOW Yellow;Lt.  Yellow;Straw;Dark Yellow;Amber;Green;Red;Brown   APPearance Turbid (A) Clear;Turbid;Slightly Cloudy;Cloudy   Specific Gravity, Urine 1.020 1.000 - 1.030   pH 6.0 5.0 - 8.0   Total Protein, Urine 30 (A) Negative   Urine Glucose NEGATIVE Negative   Ketones, ur NEGATIVE Negative   Bilirubin Urine NEGATIVE Negative   Hgb urine dipstick MODERATE (A) Negative   Urobilinogen, UA 0.2 0.0 - 1.0   Leukocytes,Ua LARGE (A) Negative   Nitrite POSITIVE (A) Negative   WBC, UA TNTC(>50/hpf) (A) 0-2/hpf   RBC / HPF 7-10/hpf (A) 0-2/hpf   Squamous Epithelial / LPF Rare(0-4/hpf) Rare(0-4/hpf)   Bacteria,  UA Many(>50/hpf) (A) None  Urine Culture     Status: Abnormal   Collection Time: 11/29/21 10:00 AM   Specimen: Urine  Result Value Ref Range   MICRO NUMBER: [id number]    SPECIMEN QUALITY: Adequate    Sample Source URINE    STATUS: FINAL    ISOLATE 1: Escherichia coli (A)     Comment: Greater than 100,000 CFU/mL of Escherichia coli      Susceptibility   Escherichia coli - URINE CULTURE, REFLEX    AMOX/CLAVULANIC 8 Sensitive     AMPICILLIN >=32 Resistant     AMPICILLIN/SULBACTAM 16 Intermediate     CEFAZOLIN* <=4 Not Reportable      * For infections other than uncomplicated UTI caused by E. coli, K. pneumoniae or P. mirabilis: Cefazolin is resistant if MIC > or = 8 mcg/mL. (Distinguishing susceptible versus intermediate for isolates with MIC < or = 4 mcg/mL requires additional testing.) For uncomplicated UTI caused by E. coli, K. pneumoniae or P. mirabilis: Cefazolin is susceptible if MIC <32 mcg/mL and predicts susceptible to the oral agents cefaclor, cefdinir, cefpodoxime, cefprozil, cefuroxime, cephalexin and loracarbef.     CEFTAZIDIME <=1 Sensitive     CEFEPIME <=1 Sensitive     CEFTRIAXONE <=1 Sensitive     CIPROFLOXACIN <=0.25 Sensitive     LEVOFLOXACIN <=0.12 Sensitive     GENTAMICIN <=1 Sensitive     IMIPENEM <=0.25 Sensitive     NITROFURANTOIN <=16 Sensitive     PIP/TAZO <=4  Sensitive     TOBRAMYCIN <=1 Sensitive     TRIMETH/SULFA* <=20 Sensitive      * For infections other than uncomplicated UTI caused by E. coli, K. pneumoniae or P. mirabilis: Cefazolin is resistant if MIC > or = 8 mcg/mL. (Distinguishing susceptible versus intermediate for isolates with MIC < or = 4 mcg/mL requires additional testing.) For uncomplicated UTI caused by E. coli, K. pneumoniae or P. mirabilis: Cefazolin is susceptible if MIC <32 mcg/mL and predicts susceptible to the oral agents cefaclor, cefdinir, cefpodoxime, cefprozil, cefuroxime, cephalexin and loracarbef. Legend: S = Susceptible  I = Intermediate R = Resistant  NS = Not susceptible * = Not tested  NR = Not reported **NN = See antimicrobic comments   CBC with Differential/Platelet     Status: Abnormal   Collection Time: 01/30/22  1:57 PM  Result Value Ref Range   WBC 10.1 4.0 - 10.5 K/uL   RBC 4.05 3.87 - 5.11 Mil/uL   Hemoglobin 11.8 (L) 12.0 - 15.0 g/dL   HCT 35.2 (L) 36.0 - 46.0 %   MCV 86.9 78.0 - 100.0 fl   MCHC 33.5 30.0 - 36.0 g/dL   RDW 14.6 11.5 - 15.5 %   Platelets 245.0 150.0 - 400.0 K/uL   Neutrophils Relative % 73.7 43.0 - 77.0 %   Lymphocytes Relative 17.9 12.0 - 46.0 %   Monocytes Relative 6.4 3.0 - 12.0 %   Eosinophils Relative 0.7 0.0 - 5.0 %   Basophils Relative 1.3 0.0 - 3.0 %   Neutro Abs 7.4 1.4 - 7.7 K/uL   Lymphs Abs 1.8 0.7 - 4.0 K/uL   Monocytes Absolute 0.6 0.1 - 1.0 K/uL   Eosinophils Absolute 0.1 0.0 - 0.7 K/uL   Basophils Absolute 0.1 0.0 - 0.1 K/uL  Basic metabolic panel     Status: Abnormal   Collection Time: 01/30/22  1:57 PM  Result Value Ref Range   Sodium 137 135 - 145 mEq/L   Potassium 4.1 3.5 -  5.1 mEq/L   Chloride 100 96 - 112 mEq/L   CO2 28 19 - 32 mEq/L   Glucose, Bld 82 70 - 99 mg/dL   BUN 24 (H) 6 - 23 mg/dL   Creatinine, Ser 1.13 0.40 - 1.20 mg/dL   GFR 48.71 (L) >60.00 mL/min    Comment: Calculated using the CKD-EPI Creatinine Equation (2021)   Calcium  9.1 8.4 - 10.5 mg/dL   Objective  Body mass index is 31.38 kg/m. Wt Readings from Last 3 Encounters:  02/16/22 182 lb 12.8 oz (82.9 kg)  01/30/22 182 lb 6.4 oz (82.7 kg)  11/29/21 182 lb 3.2 oz (82.6 kg)   Temp Readings from Last 3 Encounters:  02/16/22 97.6 F (36.4 C) (Oral)  01/30/22 98 F (36.7 C) (Temporal)  11/29/21 98.4 F (36.9 C)   BP Readings from Last 3 Encounters:  02/16/22 120/62  01/30/22 137/88  11/29/21 132/70   Pulse Readings from Last 3 Encounters:  02/16/22 70  01/30/22 67  11/29/21 70    Physical Exam Vitals and nursing note reviewed.  Constitutional:      Appearance: Normal appearance. She is well-developed and well-groomed.  HENT:     Head: Normocephalic and atraumatic.  Eyes:     Conjunctiva/sclera: Conjunctivae normal.     Pupils: Pupils are equal, round, and reactive to light.  Cardiovascular:     Rate and Rhythm: Normal rate and regular rhythm.     Heart sounds: Normal heart sounds. No murmur heard. Pulmonary:     Effort: Pulmonary effort is normal.     Breath sounds: Normal breath sounds.  Abdominal:     General: Abdomen is flat. Bowel sounds are normal.     Tenderness: There is no abdominal tenderness.  Musculoskeletal:        General: No tenderness.  Skin:    General: Skin is warm and dry.  Neurological:     General: No focal deficit present.     Mental Status: She is alert and oriented to person, place, and time. Mental status is at baseline.     Cranial Nerves: Cranial nerves 2-12 are intact.     Motor: Motor function is intact.     Coordination: Coordination is intact.     Gait: Gait is intact.  Psychiatric:        Attention and Perception: Attention and perception normal.        Mood and Affect: Mood and affect normal.        Speech: Speech normal.        Behavior: Behavior normal. Behavior is cooperative.        Thought Content: Thought content normal.        Cognition and Memory: Cognition and memory normal.         Judgment: Judgment normal.     Assessment  Plan  Hypertension, unspecified type - Plan: telmisartan (MICARDIS) 40 MG tablet 6 pm and norvasc 5 mg qam  Log BP  F/u PCP will need BMET 9/29 after ARB starting   Parkinson's disease ([place]) - Plan: For home use only DME Other see comment Abnormal gait - Plan: For home use only DME Other see comment  F/u Neurology   Provider: Dr. Olivia Mackie McLean-Scocuzza-Internal Medicine

## 2022-02-16 NOTE — Telephone Encounter (Signed)
Pt seen and evaluated today.  Dr [facility] Services - started her on telmisartan.  Follow pressures.

## 2022-02-28 DIAGNOSIS — Z96652 Presence of left artificial knee joint: Secondary | ICD-10-CM | POA: Diagnosis not present

## 2022-02-28 DIAGNOSIS — M1711 Unilateral primary osteoarthritis, right knee: Secondary | ICD-10-CM | POA: Diagnosis not present

## 2022-03-01 NOTE — Telephone Encounter (Signed)
Spoke to patient about her elevated BP even though she is taking her Amlodipine 5mg  and 2.5 mg in the morning regularly? Patient stated that she had not taken her BP anymore today.Patient also wanted Dr. Nicki Reaper to know that Dr Manuella Ghazi had weaned  her off the Sinemet for Parkinsons?

## 2022-03-01 NOTE — Telephone Encounter (Signed)
Need more information.  Need to know how her blood pressures are doing.  If she is taking amlodipine regularly (please document dose taking) and blood pressure remaining elevated, then I recommend her starting the telmisartan that was prescribed by Dr Kelly Services.  Keep appt with me next week. If any acute issues, needs to be evaluated.

## 2022-03-01 NOTE — Telephone Encounter (Signed)
Spoke to patient in regards too her Elevated BP Patient state that she has been taking the Amlodopine the 5mg  and the 2.5mg  and it was working but she is unsure why it is not working anymore? Patient also stated that she has NOT taken the Telmisartan? See note below for BP yesterday and today?

## 2022-03-01 NOTE — Telephone Encounter (Signed)
Patient states she is returning our call.  I spoke with Jenate [place]ique, CMA, and transferred call to her.    Patient states she would like for Dr. Einar Pheasant to know that Dr. Manuella Ghazi weaned her off of the Sinemet for Parkinson's.

## 2022-03-01 NOTE — Telephone Encounter (Signed)
Called patient back to advise her to be sure to begin taking the Telmisartan today and to do spot checks of her Bp up until appt on 9/29 and to bring in the readings with her to appt.. If any new issues present itself just reach out to our office.

## 2022-03-01 NOTE — Telephone Encounter (Signed)
Patient wanted to let Dr Nicki Reaper know that her BP today this morning is 146/102 and yesterday it was 148/96.

## 2022-03-01 NOTE — Telephone Encounter (Signed)
Agree with starting telmisartan now.  Have her spot check pressures and bring with her.  If problems before, let us know

## 2022-03-02 ENCOUNTER — Telehealth: Payer: Self-pay | Admitting: Internal Medicine

## 2022-03-02 ENCOUNTER — Ambulatory Visit (INDEPENDENT_AMBULATORY_CARE_PROVIDER_SITE_OTHER): Payer: Medicare Other | Admitting: Internal Medicine

## 2022-03-02 ENCOUNTER — Encounter: Payer: Self-pay | Admitting: Internal Medicine

## 2022-03-02 VITALS — BP 124/60 | HR 70 | Temp 98.0°F | Ht 64.0 in | Wt 182.6 lb

## 2022-03-02 DIAGNOSIS — R21 Rash and other nonspecific skin eruption: Secondary | ICD-10-CM | POA: Diagnosis not present

## 2022-03-02 DIAGNOSIS — S51812A Laceration without foreign body of left forearm, initial encounter: Secondary | ICD-10-CM

## 2022-03-02 MED ORDER — METHYLPREDNISOLONE ACETATE 40 MG/ML IJ SUSP
40.0000 mg | Freq: Once | INTRAMUSCULAR | Status: AC
  Administered 2022-03-02: 40 mg via INTRAMUSCULAR

## 2022-03-02 MED ORDER — CLOBETASOL PROPIONATE 0.05 % EX CREA: 1.0000 | TOPICAL_CREAM | Freq: Two times a day (BID) | CUTANEOUS | 1 refills | Status: DC

## 2022-03-02 MED ORDER — AMLODIPINE BESYLATE 5 MG PO TABS: 7.5000 mg | ORAL_TABLET | Freq: Every day | ORAL | 3 refills | Status: DC

## 2022-03-02 MED ORDER — MUPIROCIN 2 % EX OINT: 1.0000 | TOPICAL_OINTMENT | Freq: Two times a day (BID) | CUTANEOUS | 0 refills | Status: DC

## 2022-03-02 NOTE — Patient Instructions (Signed)
Calamine/caladryl, topical benadryl   Poison Oak Dermatitis  Poison oak dermatitis is inflammation of the skin that is caused by contact with the chemicals in the leaves of the poison oak (Toxicodendron) plant. The skin reaction often includes redness, swelling, blisters, and extreme itching. What are the causes? This condition is caused by a specific chemical (urushiol) that is found in the sap of the poison oak plant. This chemical is sticky and can be easily spread to people, animals, and objects. You can get poison oak dermatitis by: Having direct contact with a poison oak plant. Touching animals, other people, or objects that have come in contact with poison oak and have the chemical on them. What increases the risk? This condition is more likely to develop in people who: Are outdoors often in wooded or [place] areas. Go outdoors without wearing protective clothing, such as closed shoes, long pants, and a long-sleeved shirt. What are the signs or symptoms? Symptoms of this condition include: Redness of the skin. Extreme itching. A rash that often includes bumps and blisters. The rash usually appears 48 hours after exposure if you have been exposed before. If this is the first time you have been exposed, the rash may not appear until a week after exposure. Swelling. This may occur if the reaction is more severe. Symptoms usually last for 1-2 weeks. However, the first time you develop this condition, symptoms may last 3-4 weeks. How is this diagnosed? This condition may be diagnosed based on your symptoms and a physical exam. Your health care provider may also ask you about any recent outdoor activity. How is this treated? Treatment for this condition will vary depending on how severe it is. Treatment may include: Hydrocortisone creams or calamine lotions to relieve itching. Oatmeal baths to soothe the skin. Over-the-counter antihistamine medicines to help reduce itching. Steroid  medicine taken by mouth (orally) for more severe reactions. Follow these instructions at home: Medicines Take or apply over-the-counter and prescription medicines only as told by your health care provider. Use hydrocortisone creams or calamine lotion as needed to soothe the skin and relieve itching. General instructions Do not scratch or rub your skin. Apply a cold, wet cloth (cold compress) to the affected areas or take baths in cool water. This will help with itching. Avoid hot baths and showers. Take oatmeal baths as needed. Use colloidal oatmeal. You can get this at your local pharmacy or grocery store. Follow the instructions on the packaging. While you have the rash, wash clothes right after you wear them. Keep all follow-up visits as told by your health care provider. This is important. How is this prevented?  Learn to identify the poison oak plant and avoid contact with the plant. This plant can be recognized by the number of leaves. Generally, poison oak has three leaves with flowering branches on a single stem. The leaves are often a bit fuzzy and have a toothlike edge. If you have been exposed to poison oak, thoroughly wash your skin with soap and water right away. You have about 30 minutes to remove the plant resin before it will cause the rash. Be sure to wash under your fingernails because any plant resin there will continue to spread the rash. When hiking or camping, wear clothes that will help you avoid exposure on the skin. This includes long pants, a long-sleeved shirt, tall socks, and hiking boots. You can also apply preventive lotion to your skin to help limit exposure. If you suspect that your clothes or  outdoor gear came in contact with poison oak, rinse them off outside with a garden hose before bringing them inside your house. When doing yard work or gardening, wear gloves, long sleeves, long pants, and boots. Wash your garden tools and gloves if they come in contact with  poison oak. If you suspect that your pet has come into contact with poison oak, wash him or her with pet shampoo and water. Make sure you wear gloves while washing your pet. Do not burn poison oak plants. This can release the chemical from the plant into the air and may cause a reaction on the skin or eyes, or in the lungs from breathing in the smoke. Contact a health care provider if you have: Open sores in the rash area. More redness, swelling, or pain in the affected area. Redness that spreads beyond the rash area. Fluid, blood, or pus coming from the affected area. A fever. A rash over a large area of your body. A rash on your eyes, mouth, or genitals. A rash that does not improve after a few weeks. Get help right away if: Your face swells or your eyes swell shut. You have trouble breathing. You have trouble swallowing. These symptoms may represent a serious problem that is an emergency. Do not wait to see if the symptoms will go away. Get medical help right away. Call your local emergency services (911 in the [place] Do not drive yourself to the hospital. Summary Poison oak dermatitis is inflammation of the skin that is caused by contact with the chemicals on the leaves of the poison oak (Toxicodendron) plant. Symptoms of this condition include redness, extreme itching, a rash, and swelling. Do not scratch or rub your skin. Take or apply over-the-counter and prescription medicines only as told by your health care provider. This information is not intended to replace advice given to you by your health care provider. Make sure you discuss any questions you have with your health care provider. Document Revised: 09/19/2018 Document Reviewed: 06/27/2018 Elsevier Patient Education  [place].

## 2022-03-02 NOTE — Progress Notes (Signed)
Chief Complaint  Patient presents with   Poison Ivy    Poison ivy rash started yesterday on legs tops of feet and arms after walking the dog    F/u  1. Poison ivy on right lower leg and b/l arms after walking the dog since yesterday  2. Htn improved pt taking 7.5 norvasc and micardis 40 mg qd just started taking this 6 pm and norvasc in the am     Review of Systems  Constitutional:  Negative for weight loss.  HENT:  Negative for hearing loss.   Eyes:  Negative for blurred vision.  Respiratory:  Negative for shortness of breath.   Cardiovascular:  Negative for chest pain.  Gastrointestinal:  Negative for abdominal pain and blood in stool.  Genitourinary:  Negative for dysuria.  Musculoskeletal:  Negative for falls and joint pain.  Skin:  Positive for itching and rash.  Neurological:  Negative for headaches.  Psychiatric/Behavioral:  Negative for depression.    Past Medical History:  Diagnosis Date   Anxiety    Asthma    Atypical chest pain    a. 2003 Cath: reportedly nl per pt [name]);  b. 04/2015 [facility]mission, neg troponin.   Colon cancer ([place]) 1999   a. 1999: Pt had partial colectomy. Did develop lung metastasis. Had right upper lobectomy in 01/2007 then had associated chemotherapy.   COPD (chronic obstructive pulmonary disease) (HCC)    DDD (degenerative disc disease), lumbosacral    Depression    Echocardiogram abnormal    a. 02/2010 Echo: EF 55-60%, mild LAE, no regional wall motion abnormalities   Elevated transaminase level    a. ? NASH;  b. 06/2015 - nl LFTs.   GERD (gastroesophageal reflux disease)    Hematuria    Holter monitor, abnormal    a. 12/2009 Holter monitoring: NSR, rare PACs and PVCs.   Hypercholesterolemia    Hyperlipidemia    Hypertension    Leaky heart valve    a. Per pt report - no evidence of valvular abnormalities on prior echoes.   Lesion of skin of Right breast 10/20/2017   Lung metastases 2008   a. S/P resection (lobectomy - right)    Morbid obesity ([place])    Nephrolithiasis    Parkinson disease ([place]) 08/2016   [name]   Past Surgical History:  Procedure Laterality Date   BREAST EXCISIONAL BIOPSY Right 10/07/2009   benign   CARDIAC CATHETERIZATION  2003   negative as per pt report   CHOLECYSTECTOMY  2000   COLON SURGERY     Colon cancer, removed part of large colon   COLONOSCOPY N/A 04/25/2015   Procedure: COLONOSCOPY;  Surgeon: Manya Silvas, MD;  Location: [facility];  Service: Endoscopy;  Laterality: N/A;   COLONOSCOPY WITH PROPOFOL N/A 06/14/2017   Procedure: COLONOSCOPY WITH PROPOFOL;  Surgeon: Manya Silvas, MD;  Location: [facility] ENDOSCOPY;  Service: Endoscopy;  Laterality: N/A;   COLONOSCOPY WITH PROPOFOL N/A 11/17/2019   Procedure: COLONOSCOPY WITH PROPOFOL;  Surgeon: [name], MD;  Location: [facility] ENDOSCOPY;  Service: Gastroenterology;  Laterality: N/A;   COLONOSCOPY WITH PROPOFOL N/A 03/21/2021   Procedure: COLONOSCOPY WITH PROPOFOL;  Surgeon: Lesly Rubenstein, MD;  Location: [facility] ENDOSCOPY;  Service: Endoscopy;  Laterality: N/A;   ESOPHAGOGASTRODUODENOSCOPY (EGD) WITH PROPOFOL N/A 03/21/2021   Procedure: ESOPHAGOGASTRODUODENOSCOPY (EGD) WITH PROPOFOL;  Surgeon: Lesly Rubenstein, MD;  Location: [facility] ENDOSCOPY;  Service: Endoscopy;  Laterality: N/A;   LOBECTOMY  01/2007   Right lung, upper lobe right side  removed   TOTAL ABDOMINAL HYSTERECTOMY     abnormal bleeding   TUBAL LIGATION  1980   Family History  Problem Relation Age of Onset   Coronary artery disease Mother        died @ 21 of MI.   Mental illness Mother    Diabetes Mother    Heart attack Father 44       MI   Cancer Father        lung - died in his mid-70's   Hypertension Father    Diabetes Father    Sudden death Brother    Mental illness Brother    Heart attack Brother        MI @ 63, died @ age 43.   Breast cancer Neg Hx    Social History   Socioeconomic History   Marital status: Widowed    Spouse name: Not on  file   Number of children: Not on file   Years of education: Not on file   Highest education level: Not on file  Occupational History   Not on file  Tobacco Use   Smoking status: Never    Passive exposure: Yes   Smokeless tobacco: Never   Tobacco comments:    husband and son smoked in her home.  Vaping Use   Vaping Use: Never used  Substance and Sexual Activity   Alcohol use: Not Currently    Alcohol/week: 0.0 standard drinks of alcohol   Drug use: No   Sexual activity: Never  Other Topics Concern   Not on file  Social History Narrative   Widowed.   Disabled (colon/lung CA), retired.   Lives in [place] with her dtr, grand-dtr, and son.   Active.   Gets regular exercise, walks.   Social Determinants of Health   Financial Resource Strain: Low Risk  (11/29/2021)   Overall Financial Resource Strain (CARDIA)    Difficulty of Paying Living Expenses: Not hard at all  Food Insecurity: No Food Insecurity (11/29/2021)   Hunger Vital Sign    Worried About Running Out of Food in the Last Year: Never true    Ran Out of Food in the Last Year: Never true  Transportation Needs: No Transportation Needs (11/29/2021)   PRAPARE - Hydrologist (Medical): No    Lack of Transportation (Non-Medical): No  Physical Activity: Not on file  Stress: No Stress Concern Present (11/29/2021)   [place]    Feeling of Stress : Only a little  Social Connections: Unknown (11/29/2021)   Social Connection and Isolation Panel [NHANES]    Frequency of Communication with Friends and Family: More than three times a week    Frequency of Social Gatherings with Friends and Family: More than three times a week    Attends Religious Services: Not on file    Active Member of Clubs or Organizations: Not on file    Attends Archivist Meetings: Not on file    Marital Status: Not on file  Intimate Partner  Violence: Not At Risk (11/29/2021)   Humiliation, Afraid, Rape, and Kick questionnaire    Fear of Current or Ex-Partner: No    Emotionally Abused: No    Physically Abused: No    Sexually Abused: No   Current Meds  Medication Sig   ALPRAZolam (XANAX) 0.5 MG tablet TAKE 1/2 TABLET BY MOUTH ONCE DAILY AS NEEDED   aspirin EC 81 MG tablet  Take 81 mg by mouth as needed.    calcium-vitamin D (OSCAL WITH D) 500-200 MG-UNIT tablet Take 1 tablet by mouth.   citalopram (CELEXA) 10 MG tablet TAKE ONE AND ONE-HALF TABLET BY MOUTH DAILY   clobetasol cream (TEMOVATE) 4.27 % Apply 1 Application topically 2 (two) times daily. Arms and legs prn x 7-14 days   fluticasone-salmeterol (ADVAIR HFA) 115-21 MCG/ACT inhaler Inhale 2 puffs into the lungs 2 (two) times daily.   furosemide (LASIX) 20 MG tablet Take 1 tablet (20 mg total) by mouth daily as needed for edema.   montelukast (SINGULAIR) 10 MG tablet TAKE 1 TABLET BY MOUTH AT BEDTIME   mupirocin ointment (BACTROBAN) 2 % Apply 1 Application topically 2 (two) times daily. Left arm   omeprazole (PRILOSEC) 20 MG capsule Take 1 capsule (20 mg total) by mouth daily.   OXYGEN Inhale into the lungs. PRN   PROAIR HFA 108 (90 Base) MCG/ACT inhaler INHALE 2 PUFFS BY MOUTH INTO THE LUNGS EVERY 6 HOURS AS NEEDED FOR WHEEZING   rosuvastatin (CRESTOR) 5 MG tablet TAKE 1 TABLET BY MOUTH EVERY MONDAY, WEDNESDAY AND FRIDAY   telmisartan (MICARDIS) 40 MG tablet Take 1 tablet (40 mg total) by mouth daily. Around 6 pm   traZODone (DESYREL) 50 MG tablet TAKE 2 TABLETS BY MOUTH AT BEDTIME   vitamin B-12 (CYANOCOBALAMIN) 100 MCG tablet Take 100 mcg by mouth daily.   [DISCONTINUED] amLODipine (NORVASC) 5 MG tablet Take 1 tablet (5 mg total) by mouth daily.   Allergies  Allergen Reactions   Macrobid [Nitrofurantoin Macrocrystal] Shortness Of Breath and Palpitations   Antihistamines, Diphenhydramine-Type Other (See Comments)    She does not know which antihistamine she has an  allergic reaction to.   Aspirin Other (See Comments)    Makes her sick on the stomach if she takes too many and causes bleeding.  Of note, she can take ibuprofen. Other reaction(s): Other (See Comments) Makes her sick on the stomach.  Of note, she can take ibuprofen. Makes her sick on the stomach.  Of note, she can take ibuprofen.   Ceftin [Cefuroxime Axetil] Other (See Comments)    Chest pains   Celecoxib Other (See Comments)    Reaction: Blood Disorder GI bleeding.   Cymbalta [Duloxetine Hcl] Other (See Comments)    Reaction: unknown   Diphenhydramine Other (See Comments)   Escitalopram Other (See Comments)   Lexapro [Escitalopram Oxalate] Other (See Comments)    Reaction: Difficulty with speech   Metronidazole Other (See Comments)    Other reaction(s): Unknown   Nitrofurantoin Other (See Comments)   Zoloft [Sertraline Hcl]    Ciprofloxacin Other (See Comments)    Reaction: unknown   Recent Results (from the past 2160 hour(s))  CBC with Differential/Platelet     Status: Abnormal   Collection Time: 01/30/22  1:57 PM  Result Value Ref Range   WBC 10.1 4.0 - 10.5 K/uL   RBC 4.05 3.87 - 5.11 Mil/uL   Hemoglobin 11.8 (L) 12.0 - 15.0 g/dL   HCT 35.2 (L) 36.0 - 46.0 %   MCV 86.9 78.0 - 100.0 fl   MCHC 33.5 30.0 - 36.0 g/dL   RDW 14.6 11.5 - 15.5 %   Platelets 245.0 150.0 - 400.0 K/uL   Neutrophils Relative % 73.7 43.0 - 77.0 %   Lymphocytes Relative 17.9 12.0 - 46.0 %   Monocytes Relative 6.4 3.0 - 12.0 %   Eosinophils Relative 0.7 0.0 - 5.0 %   Basophils Relative 1.3  0.0 - 3.0 %   Neutro Abs 7.4 1.4 - 7.7 K/uL   Lymphs Abs 1.8 0.7 - 4.0 K/uL   Monocytes Absolute 0.6 0.1 - 1.0 K/uL   Eosinophils Absolute 0.1 0.0 - 0.7 K/uL   Basophils Absolute 0.1 0.0 - 0.1 K/uL  Basic metabolic panel     Status: Abnormal   Collection Time: 01/30/22  1:57 PM  Result Value Ref Range   Sodium 137 135 - 145 mEq/L   Potassium 4.1 3.5 - 5.1 mEq/L   Chloride 100 96 - 112 mEq/L   CO2 28 19 -  32 mEq/L   Glucose, Bld 82 70 - 99 mg/dL   BUN 24 (H) 6 - 23 mg/dL   Creatinine, Ser 1.13 0.40 - 1.20 mg/dL   GFR 48.71 (L) >60.00 mL/min    Comment: Calculated using the CKD-EPI Creatinine Equation (2021)   Calcium 9.1 8.4 - 10.5 mg/dL   Objective  Body mass index is 31.34 kg/m. Wt Readings from Last 3 Encounters:  03/02/22 182 lb 9.6 oz (82.8 kg)  02/16/22 182 lb 12.8 oz (82.9 kg)  01/30/22 182 lb 6.4 oz (82.7 kg)   Temp Readings from Last 3 Encounters:  03/02/22 98 F (36.7 C) (Oral)  02/16/22 97.6 F (36.4 C) (Oral)  01/30/22 98 F (36.7 C) (Temporal)   BP Readings from Last 3 Encounters:  03/02/22 124/60  02/16/22 120/62  01/30/22 137/88   Pulse Readings from Last 3 Encounters:  03/02/22 70  02/16/22 70  01/30/22 67    Physical Exam Vitals and nursing note reviewed.  Constitutional:      Appearance: Normal appearance. She is well-developed and well-groomed.  HENT:     Head: Normocephalic and atraumatic.  Eyes:     Conjunctiva/sclera: Conjunctivae normal.     Pupils: Pupils are equal, round, and reactive to light.  Cardiovascular:     Rate and Rhythm: Normal rate and regular rhythm.     Heart sounds: Normal heart sounds. No murmur heard. Pulmonary:     Effort: Pulmonary effort is normal.     Breath sounds: Normal breath sounds.  Abdominal:     General: Abdomen is flat. Bowel sounds are normal.     Tenderness: There is no abdominal tenderness.  Musculoskeletal:        General: No tenderness.  Skin:    General: Skin is warm and dry.  Neurological:     General: No focal deficit present.     Mental Status: She is alert and oriented to person, place, and time. Mental status is at baseline.     Cranial Nerves: Cranial nerves 2-12 are intact.     Motor: Motor function is intact.     Coordination: Coordination is intact.     Gait: Gait is intact.  Psychiatric:        Attention and Perception: Attention and perception normal.        Mood and Affect: Mood  and affect normal.        Speech: Speech normal.        Behavior: Behavior normal. Behavior is cooperative.        Thought Content: Thought content normal.        Cognition and Memory: Cognition and memory normal.        Judgment: Judgment normal.     Assessment  Plan  Rash poison ivy - Plan: clobetasol cream (TEMOVATE) 0.05 % Depomedrol 40 x 1   Laceration of left forearm, initial encounter - Plan:  mupirocin ointment (BACTROBAN) 2 %   Provider: Dr. Olivia Mackie [facility]

## 2022-03-02 NOTE — Telephone Encounter (Signed)
Patient has poison Ivy and would like something to call in. It is located on top of feet, leg, and a spot on shoulder, back, and hip. Patient has been scheduled to come into office to see Dr Olivia Mackie

## 2022-03-02 NOTE — Addendum Note (Signed)
Addended by: Gracy Racer on: 03/02/2022 02:12 PM   Modules accepted: Orders

## 2022-03-02 NOTE — Progress Notes (Signed)
Depo medrol 40 mg injection was given to pt in the left deltoid. Pt tolerated shot well.    --Sharyn Lull, CMA

## 2022-03-04 IMAGING — CR DG CHEST 2V
2 series · 2 of 2 positions shown · non-contrast
Comparison: 11/02/2019

CLINICAL DATA: Shortness of breath

EXAM:
CHEST - 2 VIEW

[chest lat]
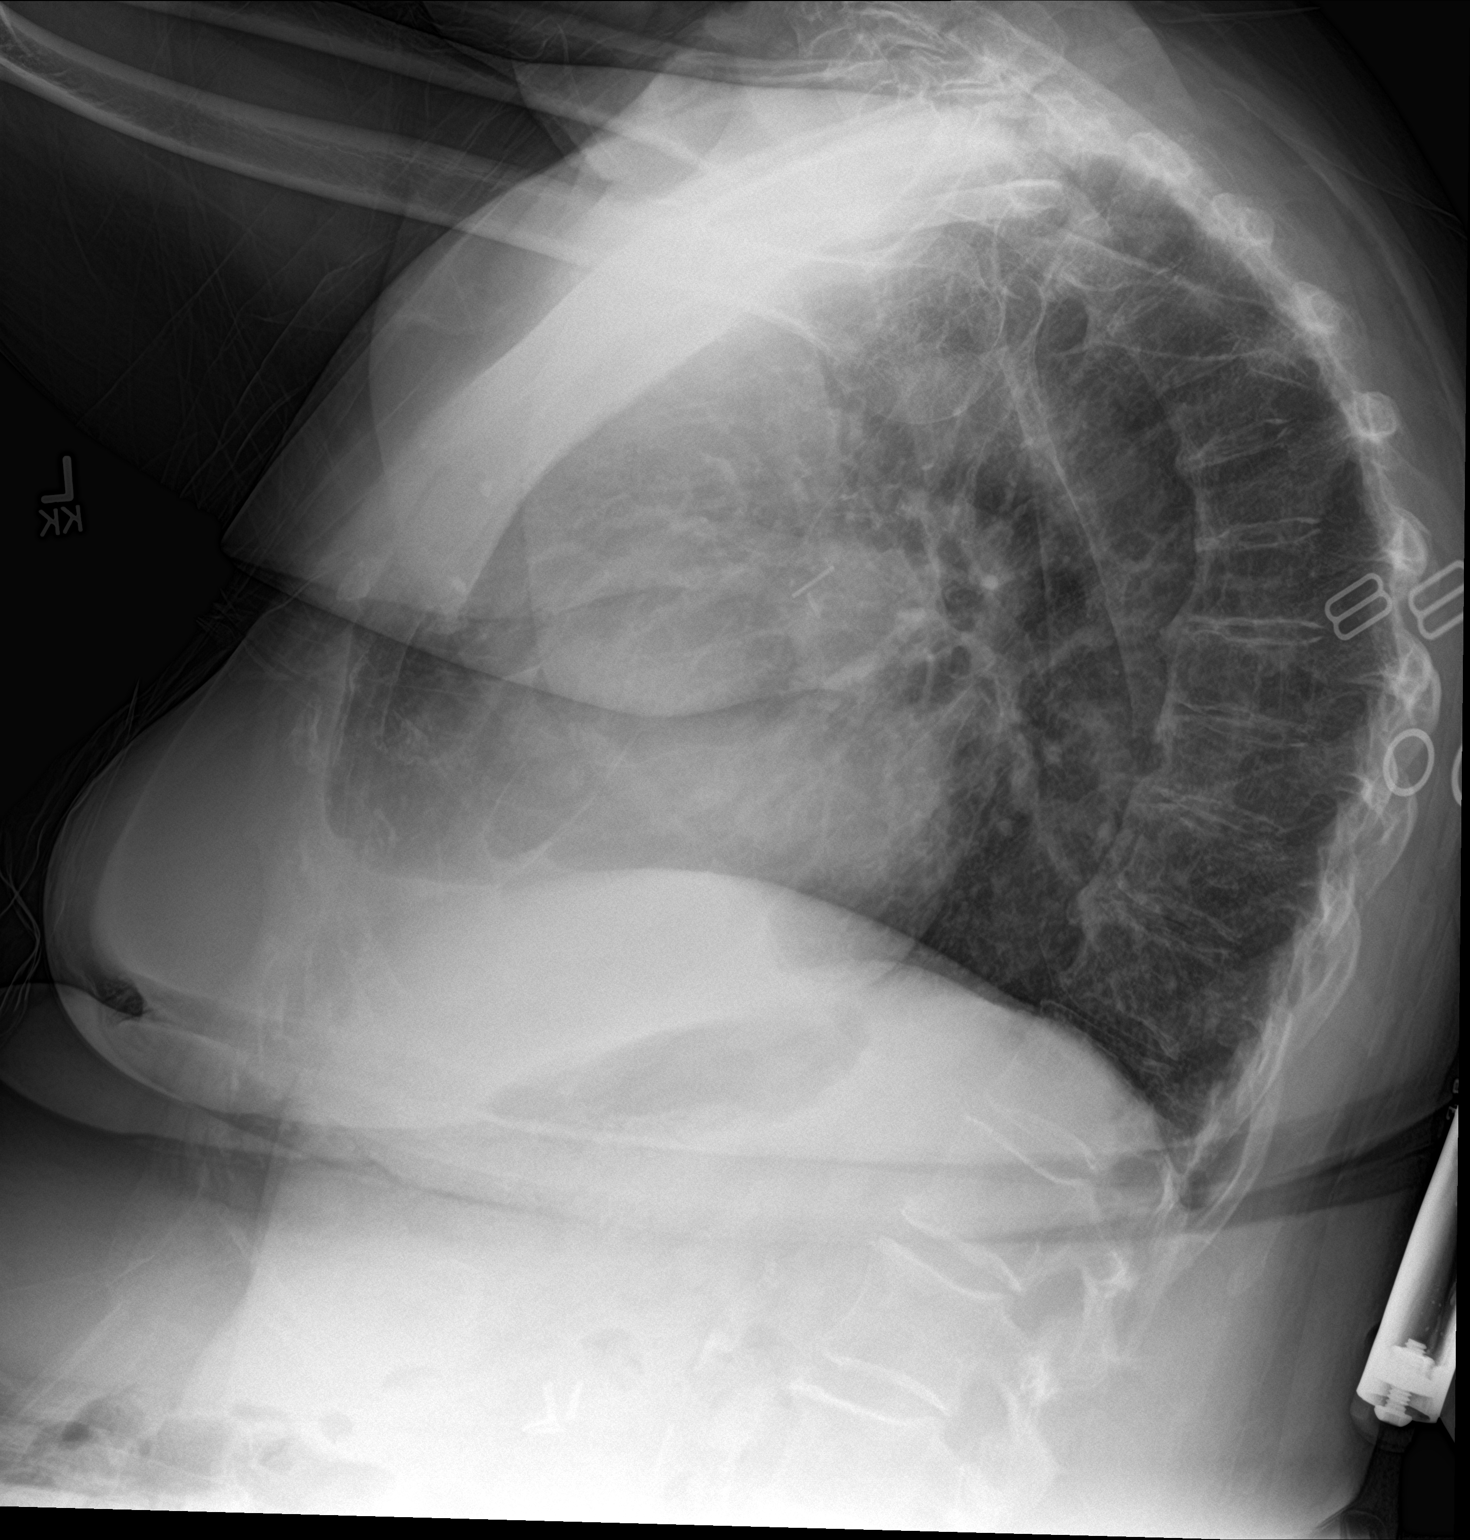

[chest ap]
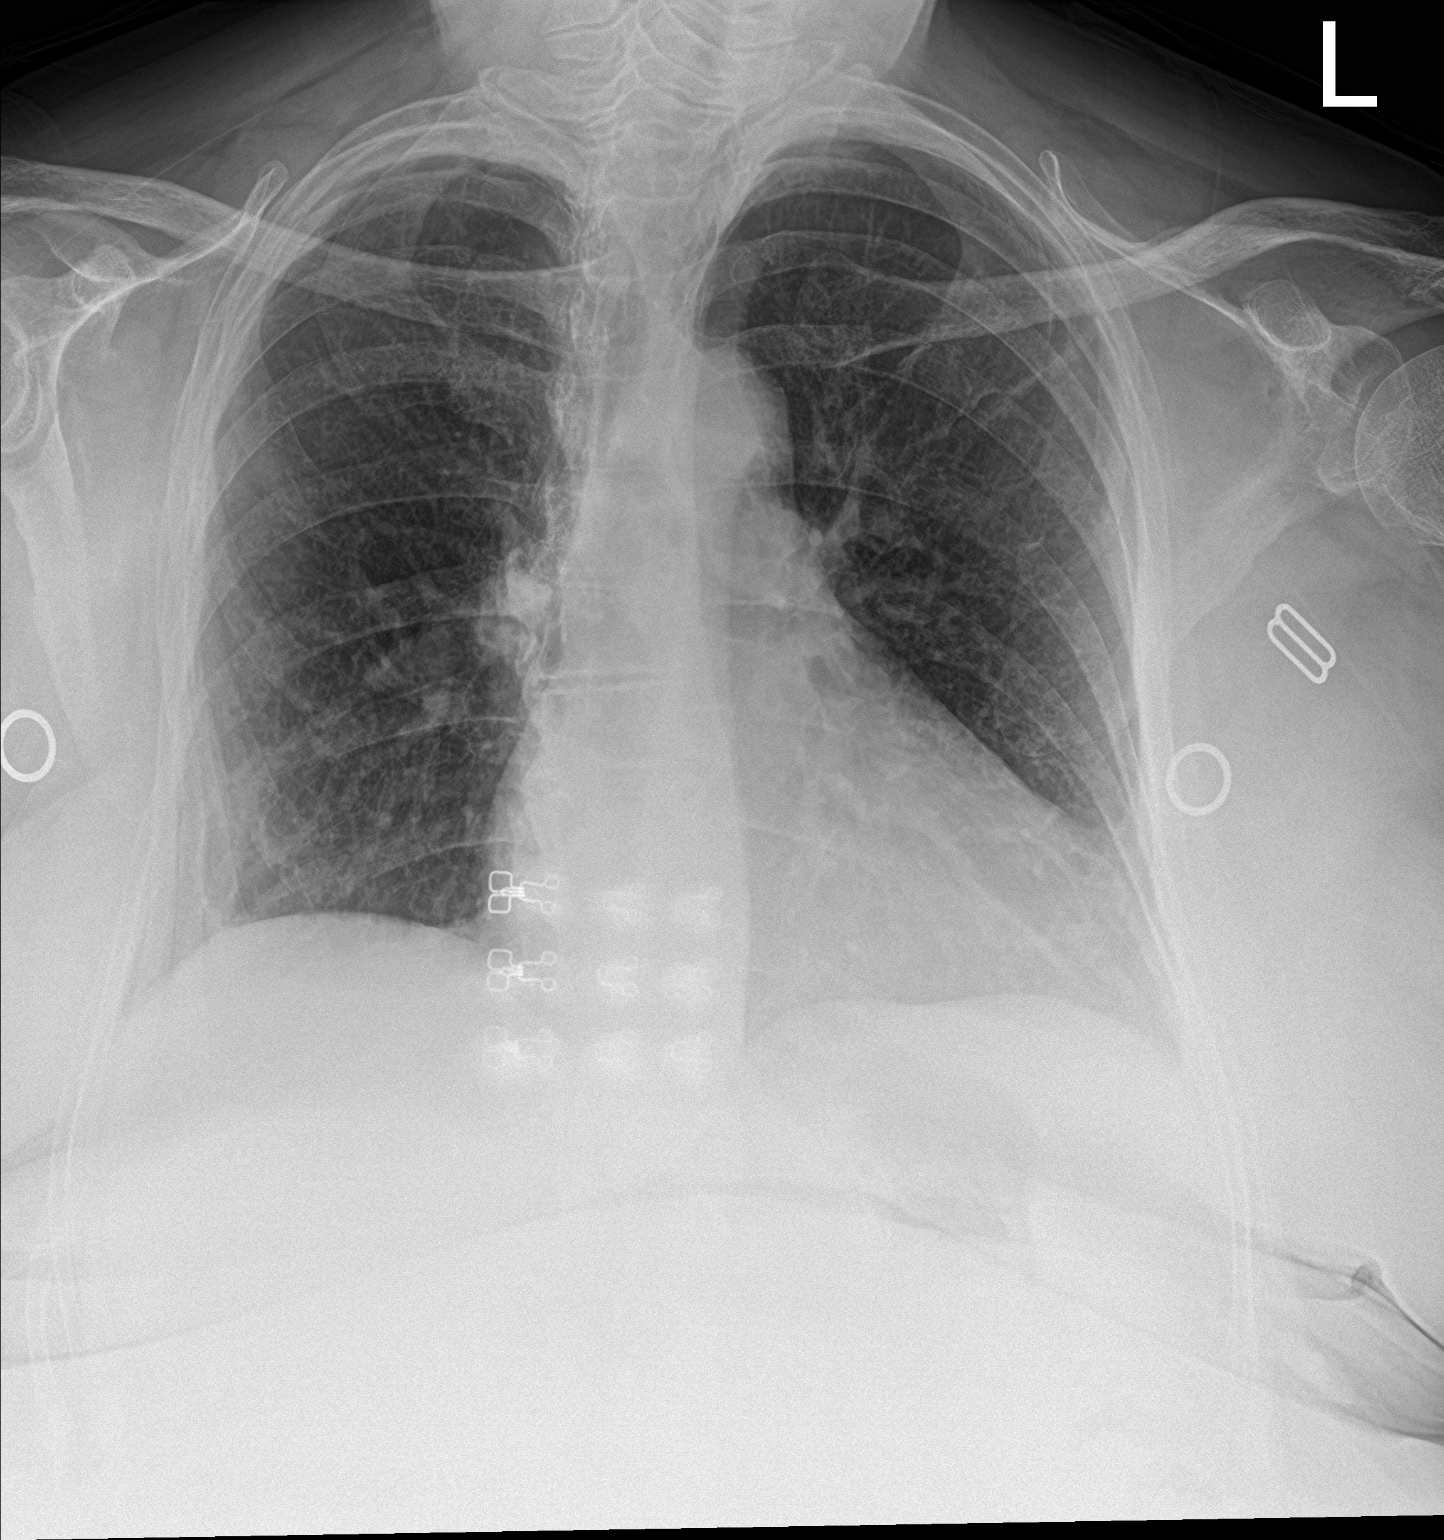

[2 of 2 positions shown; findings below may reference images not displayed]

FINDINGS: Enlargement of cardiac silhouette.

Mediastinal contours and pulmonary vascularity normal.

Emphysematous changes with minimal scarring at RIGHT base.

No acute infiltrate, pleural effusion, or pneumothorax.

Bones demineralized with mildly accentuated thoracic kyphosis.
IMPRESSION: Enlargement of cardiac silhouette.

Emphysematous changes with RIGHT basilar scarring.

No acute abnormalities.

## 2022-03-04 IMAGING — US US EXTREM LOW VENOUS*R*
1 series · 13 of 24 positions shown · non-contrast
Comparison: None.

CLINICAL DATA: Calf pain



[Series 1: us venous img lower uni right (dvt) · portal-venous · 13 of 25 slices shown]
[im 1/25]
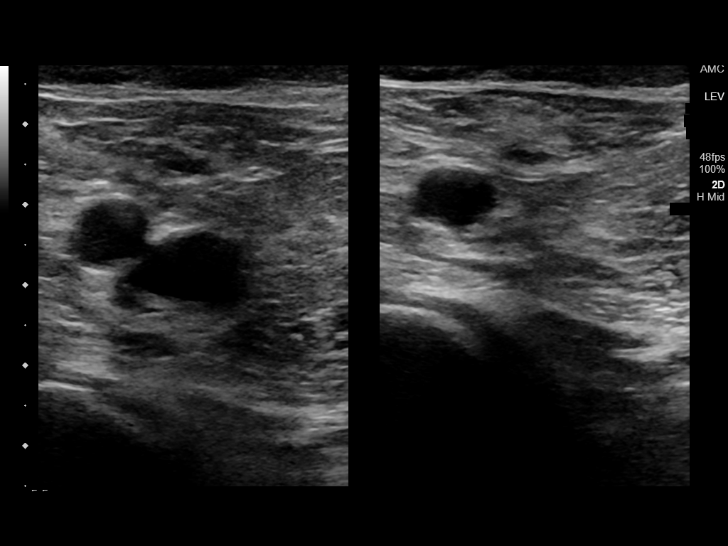
[im 3/25]
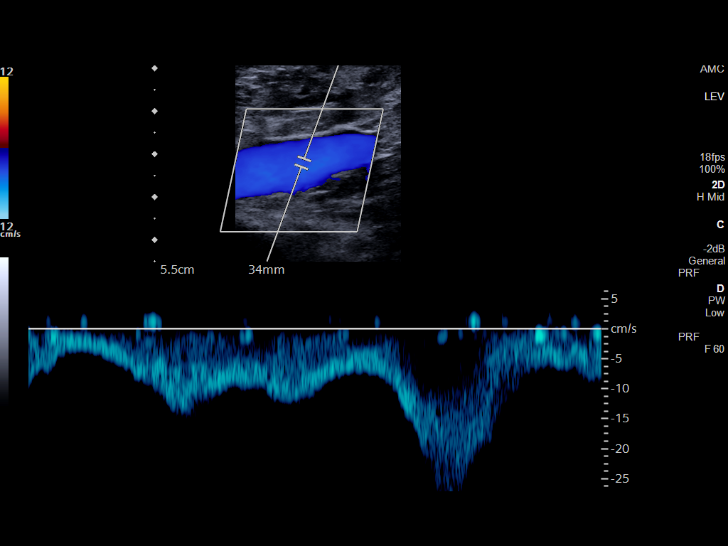
[im 5/25]
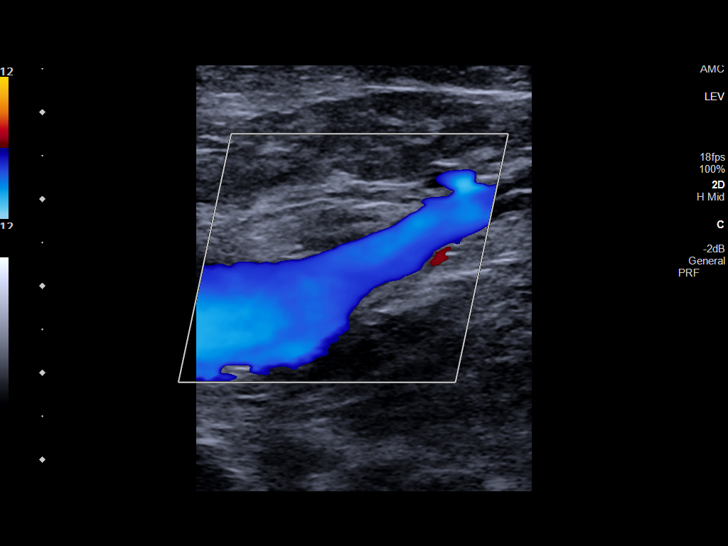
[im 7/25]
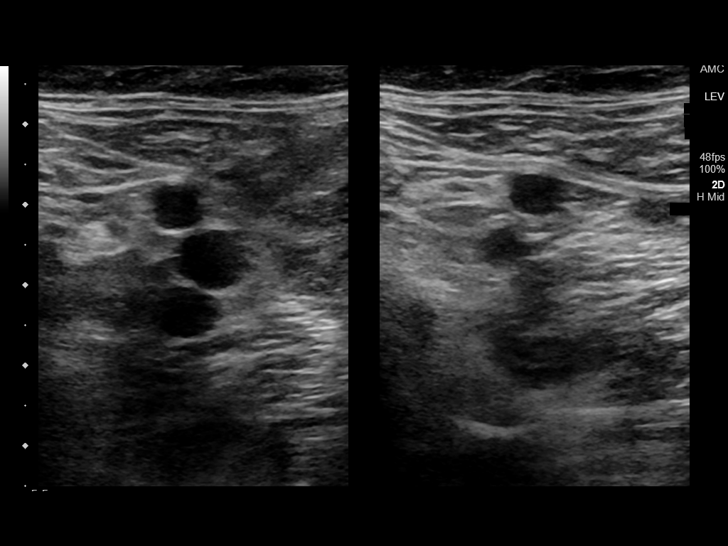
[im 9/25]
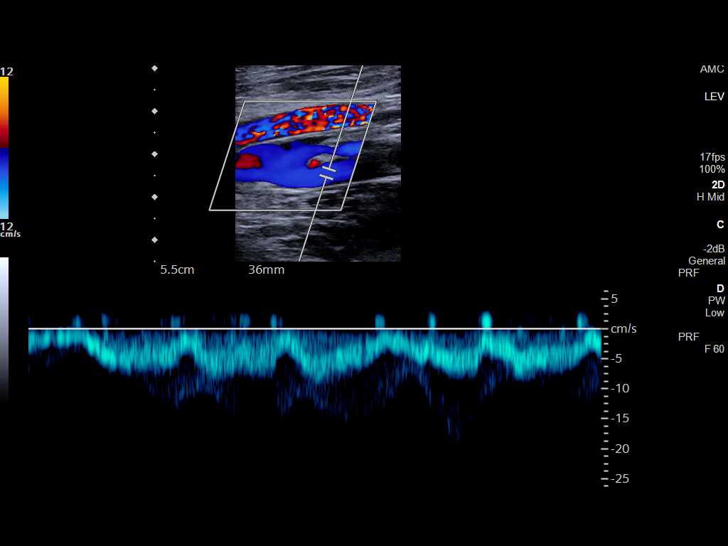
[im 11/25]
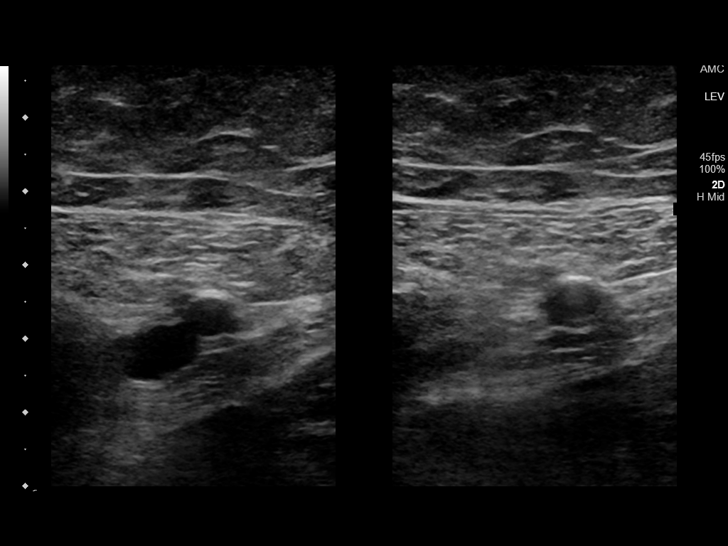
[im 13/25]
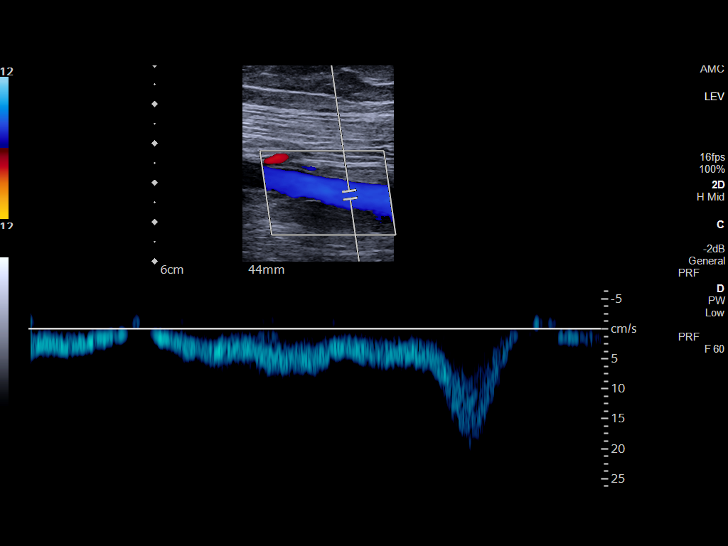
[im 14/25]
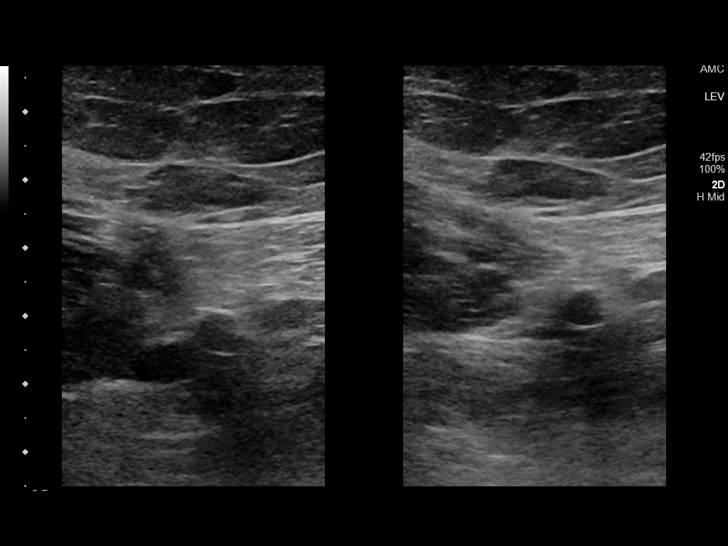
[im 16/25]
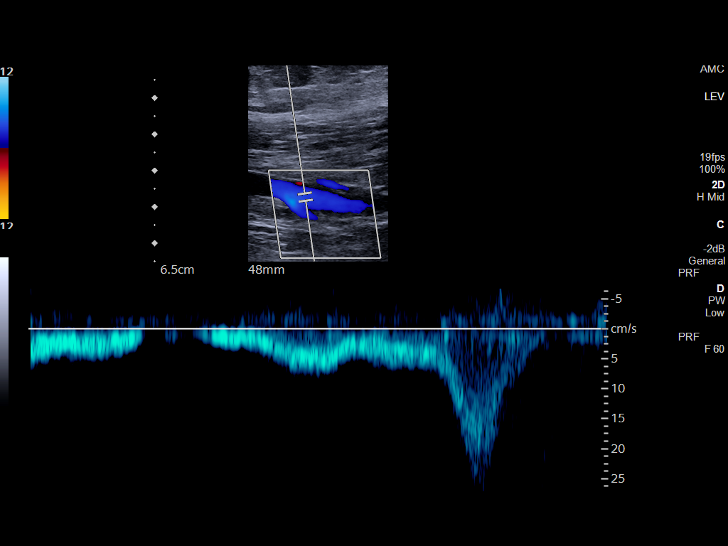
[im 18/25]
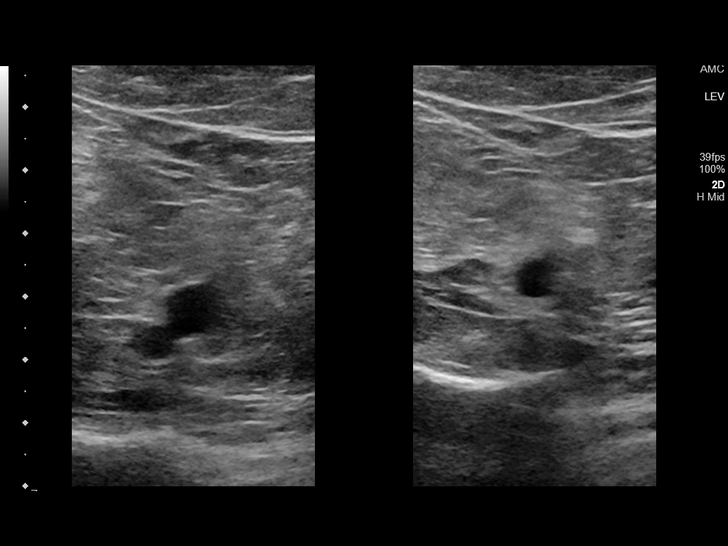
[im 20/25]
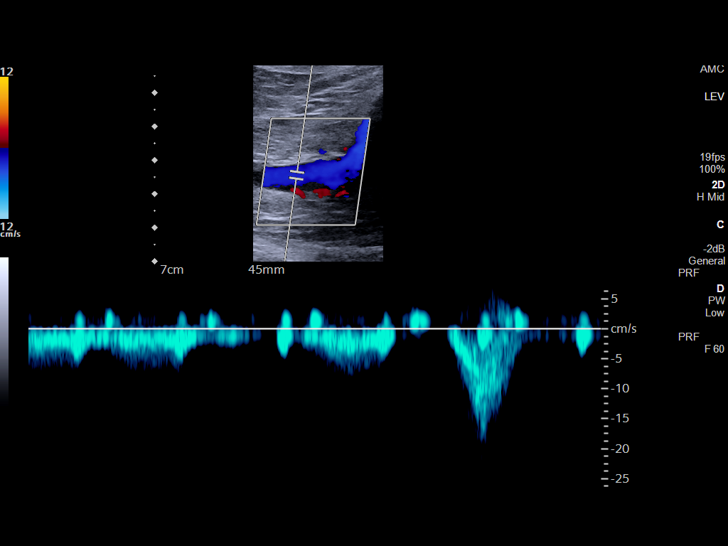
[im 22/25]
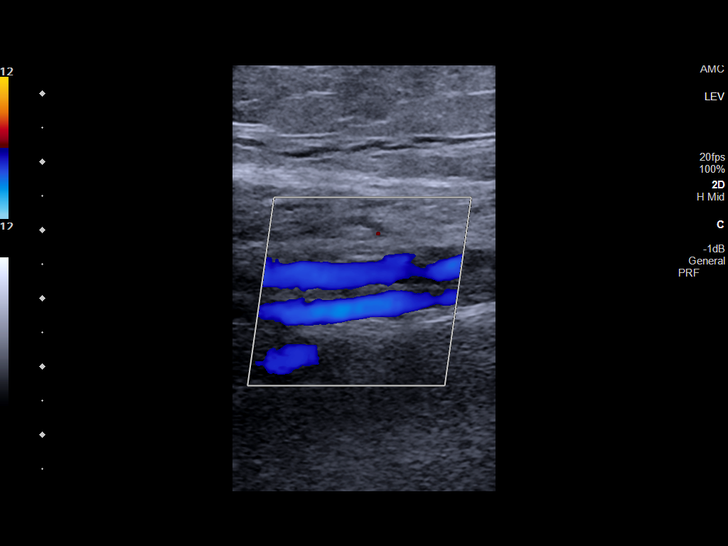
[im 25/25]
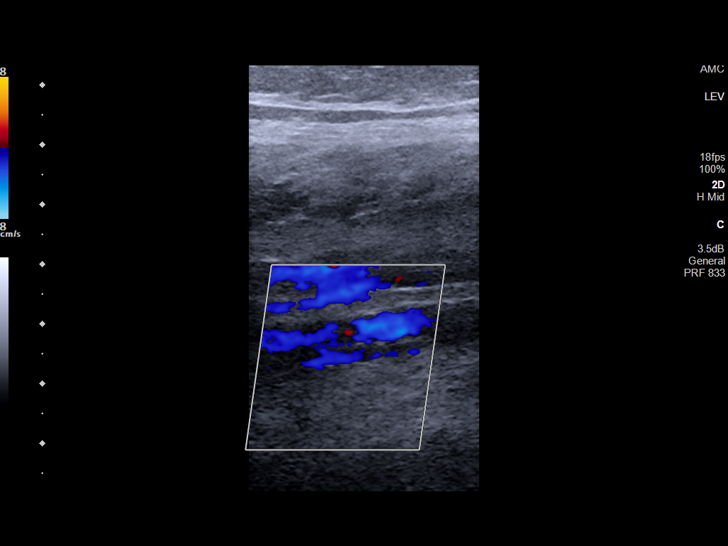

[13 of 24 positions shown; findings below may reference images not displayed]

FINDINGS: RIGHT LOWER EXTREMITY

Common Femoral Vein: No evidence of thrombus. Normal
compressibility, respiratory phasicity and response to augmentation.

Saphenofemoral Junction: No evidence of thrombus. Normal
compressibility and flow on color Doppler imaging.

Profunda Femoral Vein: No evidence of thrombus. Normal
compressibility and flow on color Doppler imaging.

Femoral Vein: No evidence of thrombus. Normal compressibility,
respiratory phasicity and response to augmentation.

Popliteal Vein: No evidence of thrombus. Normal compressibility,
respiratory phasicity and response to augmentation.

Calf Veins: No evidence of thrombus. Normal compressibility and flow
on color Doppler imaging.

Superficial Great Saphenous Vein: No evidence of thrombus. Normal
compressibility.

Venous Reflux:  None.

Other Findings:  None.

LEFT LOWER EXTREMITY

Common Femoral Vein: No evidence of thrombus. Normal
compressibility, respiratory phasicity and response to augmentation.

Saphenofemoral Junction: No evidence of thrombus. Normal
compressibility and flow on color Doppler imaging.

Profunda Femoral Vein: No evidence of thrombus. Normal
compressibility and flow on color Doppler imaging.

Femoral Vein: No evidence of thrombus. Normal compressibility,
respiratory phasicity and response to augmentation.

Popliteal Vein: No evidence of thrombus. Normal compressibility,
respiratory phasicity and response to augmentation.

Calf Veins: No evidence of thrombus. Normal compressibility and flow
on color Doppler imaging.

Superficial Great Saphenous Vein: No evidence of thrombus. Normal
compressibility.

Venous Reflux:  None.

Other Findings:  None.
IMPRESSION: No evidence of deep venous thrombosis in either lower extremity.

## 2022-03-09 ENCOUNTER — Encounter: Payer: Self-pay | Admitting: Internal Medicine

## 2022-03-09 ENCOUNTER — Ambulatory Visit (INDEPENDENT_AMBULATORY_CARE_PROVIDER_SITE_OTHER): Payer: Medicare Other | Admitting: Internal Medicine

## 2022-03-09 VITALS — BP 120/64 | HR 71 | Temp 97.5°F | Ht 64.0 in | Wt 177.3 lb

## 2022-03-09 DIAGNOSIS — E7800 Pure hypercholesterolemia, unspecified: Secondary | ICD-10-CM | POA: Diagnosis not present

## 2022-03-09 DIAGNOSIS — I773 Arterial fibromuscular dysplasia: Secondary | ICD-10-CM

## 2022-03-09 DIAGNOSIS — R739 Hyperglycemia, unspecified: Secondary | ICD-10-CM | POA: Diagnosis not present

## 2022-03-09 DIAGNOSIS — K760 Fatty (change of) liver, not elsewhere classified: Secondary | ICD-10-CM | POA: Diagnosis not present

## 2022-03-09 DIAGNOSIS — E559 Vitamin D deficiency, unspecified: Secondary | ICD-10-CM

## 2022-03-09 DIAGNOSIS — I700 Atherosclerosis of aorta: Secondary | ICD-10-CM

## 2022-03-09 DIAGNOSIS — E538 Deficiency of other specified B group vitamins: Secondary | ICD-10-CM | POA: Diagnosis not present

## 2022-03-09 DIAGNOSIS — I779 Disorder of arteries and arterioles, unspecified: Secondary | ICD-10-CM | POA: Diagnosis not present

## 2022-03-09 DIAGNOSIS — I10 Essential (primary) hypertension: Secondary | ICD-10-CM | POA: Diagnosis not present

## 2022-03-09 DIAGNOSIS — D649 Anemia, unspecified: Secondary | ICD-10-CM

## 2022-03-09 DIAGNOSIS — Z23 Encounter for immunization: Secondary | ICD-10-CM

## 2022-03-09 DIAGNOSIS — N183 Chronic kidney disease, stage 3 unspecified: Secondary | ICD-10-CM | POA: Diagnosis not present

## 2022-03-09 DIAGNOSIS — G20A1 Parkinson's disease without dyskinesia, without mention of fluctuations: Secondary | ICD-10-CM

## 2022-03-09 DIAGNOSIS — F339 Major depressive disorder, recurrent, unspecified: Secondary | ICD-10-CM

## 2022-03-09 DIAGNOSIS — Z8589 Personal history of malignant neoplasm of other organs and systems: Secondary | ICD-10-CM

## 2022-03-09 DIAGNOSIS — K219 Gastro-esophageal reflux disease without esophagitis: Secondary | ICD-10-CM

## 2022-03-09 LAB — CBC WITH DIFFERENTIAL/PLATELET
Basophils Absolute: 0.1 10*3/uL (ref 0.0–0.1)
Basophils Relative: 0.8 % (ref 0.0–3.0)
Eosinophils Absolute: 0.1 10*3/uL (ref 0.0–0.7)
Eosinophils Relative: 1.3 % (ref 0.0–5.0)
HCT: 38.6 % (ref 36.0–46.0)
Hemoglobin: 12.6 g/dL (ref 12.0–15.0)
Lymphocytes Relative: 15.9 % (ref 12.0–46.0)
Lymphs Abs: 1.7 10*3/uL (ref 0.7–4.0)
MCHC: 32.8 g/dL (ref 30.0–36.0)
MCV: 87.3 fl (ref 78.0–100.0)
Monocytes Absolute: 0.6 10*3/uL (ref 0.1–1.0)
Monocytes Relative: 5.6 % (ref 3.0–12.0)
Neutro Abs: 8.3 10*3/uL — ABNORMAL HIGH (ref 1.4–7.7)
Neutrophils Relative %: 76.4 % (ref 43.0–77.0)
Platelets: 295 10*3/uL (ref 150.0–400.0)
RBC: 4.42 Mil/uL (ref 3.87–5.11)
RDW: 14.2 % (ref 11.5–15.5)
WBC: 10.9 10*3/uL — ABNORMAL HIGH (ref 4.0–10.5)

## 2022-03-09 LAB — LIPID PANEL
Cholesterol: 207 mg/dL — ABNORMAL HIGH (ref 0–200)
HDL: 72.6 mg/dL (ref >39.00)
LDL Cholesterol: 119 mg/dL — ABNORMAL HIGH (ref 0–99)
NonHDL: 134.56
Total CHOL/HDL Ratio: 3
Triglycerides: 77 mg/dL (ref 0.0–149.0)
VLDL: 15.4 mg/dL (ref 0.0–40.0)

## 2022-03-09 LAB — BASIC METABOLIC PANEL
BUN: 25 mg/dL — ABNORMAL HIGH (ref 6–23)
CO2: 26 mEq/L (ref 19–32)
Calcium: 9.7 mg/dL (ref 8.4–10.5)
Chloride: 105 mEq/L (ref 96–112)
Creatinine, Ser: 1.08 mg/dL (ref 0.40–1.20)
GFR: 51.39 mL/min — ABNORMAL LOW (ref >60.00)
Glucose, Bld: 96 mg/dL (ref 70–99)
Potassium: 4.1 mEq/L (ref 3.5–5.1)
Sodium: 141 mEq/L (ref 135–145)

## 2022-03-09 LAB — HEPATIC FUNCTION PANEL
ALT: 6 U/L (ref 0–35)
AST: 10 U/L (ref 0–37)
Albumin: 4.1 g/dL (ref 3.5–5.2)
Alkaline Phosphatase: 78 U/L (ref 39–117)
Bilirubin, Direct: 0.1 mg/dL (ref 0.0–0.3)
Total Bilirubin: 0.4 mg/dL (ref 0.2–1.2)
Total Protein: 6.9 g/dL (ref 6.0–8.3)

## 2022-03-09 LAB — TSH: TSH: 1.67 u[IU]/mL (ref 0.35–5.50)

## 2022-03-09 LAB — VITAMIN B12: Vitamin B-12: 798 pg/mL (ref 211–911)

## 2022-03-09 LAB — VITAMIN D 25 HYDROXY (VIT D DEFICIENCY, FRACTURES): VITD: 40.97 ng/mL (ref 30.00–100.00)

## 2022-03-09 LAB — HEMOGLOBIN A1C: Hgb A1c MFr Bld: 5.9 % (ref 4.6–6.5)

## 2022-03-09 MED ORDER — CITALOPRAM HYDROBROMIDE 20 MG PO TABS: 20.0000 mg | ORAL_TABLET | Freq: Every day | ORAL | 3 refills | Status: DC

## 2022-03-09 NOTE — Progress Notes (Unsigned)
Patient ID: NEETU CARROZZA, female   DOB: Feb 10, 1951, 71 y.o.   MRN: [id number]   Subjective:    Patient ID: KEBRA LOWRIMORE, female    DOB: [birth year], 71 y.o.   MRN: [id number]   Patient here for  Chief Complaint  Patient presents with   Follow-up    Follow up   .   HPI Here to follow up regarding her blood pressure.  On amlodipine and micardis currently. Saw neurology 02/08/22 - sinemet stopped due to intolerance.  Recommended home health PT/OT.    Past Medical History:  Diagnosis Date   Anxiety    Asthma    Atypical chest pain    a. 2003 Cath: reportedly nl per pt [name]);  b. 04/2015 [facility], neg troponin.   Colon cancer ([facility]) 1999   a. 1999: Pt had partial colectomy. Did develop lung metastasis. Had right upper lobectomy in 01/2007 then had associated chemotherapy.   COPD (chronic obstructive pulmonary disease) (HCC)    DDD (degenerative disc disease), lumbosacral    Depression    Echocardiogram abnormal    a. 02/2010 Echo: EF 55-60%, mild LAE, no regional wall motion abnormalities   Elevated transaminase level    a. ? NASH;  b. 06/2015 - nl LFTs.   GERD (gastroesophageal reflux disease)    Hematuria    Holter monitor, abnormal    a. 12/2009 Holter monitoring: NSR, rare PACs and PVCs.   Hypercholesterolemia    Hyperlipidemia    Hypertension    Leaky heart valve    a. Per pt report - no evidence of valvular abnormalities on prior echoes.   Lesion of skin of Right breast 10/20/2017   Lung metastases 2008   a. S/P resection (lobectomy - right)   Morbid obesity ([place])    Nephrolithiasis    Parkinson disease ([place]) 08/2016   [name]   Past Surgical History:  Procedure Laterality Date   BREAST EXCISIONAL BIOPSY Right 10/07/2009   benign   CARDIAC CATHETERIZATION  2003   negative as per pt report   CHOLECYSTECTOMY  2000   COLON SURGERY     Colon cancer, removed part of large colon   COLONOSCOPY N/A 04/25/2015   Procedure: COLONOSCOPY;  Surgeon: Manya Silvas, MD;  Location: [facility];  Service: Endoscopy;  Laterality: N/A;   COLONOSCOPY WITH PROPOFOL N/A 06/14/2017   Procedure: COLONOSCOPY WITH PROPOFOL;  Surgeon: Manya Silvas, MD;  Location: [facility] ENDOSCOPY;  Service: Endoscopy;  Laterality: N/A;   COLONOSCOPY WITH PROPOFOL N/A 11/17/2019   Procedure: COLONOSCOPY WITH PROPOFOL;  Surgeon: [name], MD;  Location: [facility] ENDOSCOPY;  Service: Gastroenterology;  Laterality: N/A;   COLONOSCOPY WITH PROPOFOL N/A 03/21/2021   Procedure: COLONOSCOPY WITH PROPOFOL;  Surgeon: Lesly Rubenstein, MD;  Location: [facility] ENDOSCOPY;  Service: Endoscopy;  Laterality: N/A;   ESOPHAGOGASTRODUODENOSCOPY (EGD) WITH PROPOFOL N/A 03/21/2021   Procedure: ESOPHAGOGASTRODUODENOSCOPY (EGD) WITH PROPOFOL;  Surgeon: Lesly Rubenstein, MD;  Location: [facility] ENDOSCOPY;  Service: Endoscopy;  Laterality: N/A;   LOBECTOMY  01/2007   Right lung, upper lobe right side removed   TOTAL ABDOMINAL HYSTERECTOMY     abnormal bleeding   TUBAL LIGATION  1980   Family History  Problem Relation Age of Onset   Coronary artery disease Mother        died @ 65 of MI.   Mental illness Mother    Diabetes Mother    Heart attack Father 42       MI  Cancer Father        lung - died in his mid-70's   Hypertension Father    Diabetes Father    Sudden death Brother    Mental illness Brother    Heart attack Brother        MI @ 71, died @ age 20.   Breast cancer Neg Hx    Social History   Socioeconomic History   Marital status: Widowed    Spouse name: Not on file   Number of children: Not on file   Years of education: Not on file   Highest education level: Not on file  Occupational History   Not on file  Tobacco Use   Smoking status: Never    Passive exposure: Yes   Smokeless tobacco: Never   Tobacco comments:    husband and son smoked in her home.  Vaping Use   Vaping Use: Never used  Substance and Sexual Activity   Alcohol use: Not Currently    Alcohol/week:  0.0 standard drinks of alcohol   Drug use: No   Sexual activity: Never  Other Topics Concern   Not on file  Social History Narrative   Widowed.   Disabled (colon/lung CA), retired.   Lives in [place] with her dtr, grand-dtr, and son.   Active.   Gets regular exercise, walks.   Social Determinants of Health   Financial Resource Strain: Low Risk  (11/29/2021)   Overall Financial Resource Strain (CARDIA)    Difficulty of Paying Living Expenses: Not hard at all  Food Insecurity: No Food Insecurity (11/29/2021)   Hunger Vital Sign    Worried About Running Out of Food in the Last Year: Never true    Ran Out of Food in the Last Year: Never true  Transportation Needs: No Transportation Needs (11/29/2021)   PRAPARE - Hydrologist (Medical): No    Lack of Transportation (Non-Medical): No  Physical Activity: Not on file  Stress: No Stress Concern Present (11/29/2021)   [place]    Feeling of Stress : Only a little  Social Connections: Unknown (11/29/2021)   Social Connection and Isolation Panel [NHANES]    Frequency of Communication with Friends and Family: More than three times a week    Frequency of Social Gatherings with Friends and Family: More than three times a week    Attends Religious Services: Not on file    Active Member of Clubs or Organizations: Not on file    Attends Archivist Meetings: Not on file    Marital Status: Not on file     Review of Systems     Objective:     There were no vitals taken for this visit. Wt Readings from Last 3 Encounters:  03/02/22 182 lb 9.6 oz (82.8 kg)  02/16/22 182 lb 12.8 oz (82.9 kg)  01/30/22 182 lb 6.4 oz (82.7 kg)    Physical Exam   Outpatient Encounter Medications as of 03/09/2022  Medication Sig   ALPRAZolam (XANAX) 0.5 MG tablet TAKE 1/2 TABLET BY MOUTH ONCE DAILY AS NEEDED   amLODipine (NORVASC) 5 MG tablet Take  1.5 tablets (7.5 mg total) by mouth daily. (5 mg + 2.5 mg qd)   aspirin EC 81 MG tablet Take 81 mg by mouth as needed.    calcium-vitamin D (OSCAL WITH D) 500-200 MG-UNIT tablet Take 1 tablet by mouth.   citalopram (CELEXA) 10 MG tablet  TAKE ONE AND ONE-HALF TABLET BY MOUTH DAILY   clobetasol cream (TEMOVATE) 2.44 % Apply 1 Application topically 2 (two) times daily. Arms and legs prn x 7-14 days   fluticasone (FLONASE) 50 MCG/ACT nasal spray Place 2 sprays into both nostrils daily.   fluticasone-salmeterol (ADVAIR HFA) 115-21 MCG/ACT inhaler Inhale 2 puffs into the lungs 2 (two) times daily.   furosemide (LASIX) 20 MG tablet Take 1 tablet (20 mg total) by mouth daily as needed for edema.   montelukast (SINGULAIR) 10 MG tablet TAKE 1 TABLET BY MOUTH AT BEDTIME   mupirocin ointment (BACTROBAN) 2 % Apply 1 Application topically 2 (two) times daily. Left arm   omeprazole (PRILOSEC) 20 MG capsule Take 1 capsule (20 mg total) by mouth daily.   OXYGEN Inhale into the lungs. PRN   PROAIR HFA 108 (90 Base) MCG/ACT inhaler INHALE 2 PUFFS BY MOUTH INTO THE LUNGS EVERY 6 HOURS AS NEEDED FOR WHEEZING   rosuvastatin (CRESTOR) 5 MG tablet TAKE 1 TABLET BY MOUTH EVERY MONDAY, WEDNESDAY AND FRIDAY   telmisartan (MICARDIS) 40 MG tablet Take 1 tablet (40 mg total) by mouth daily. Around 6 pm   traZODone (DESYREL) 50 MG tablet TAKE 2 TABLETS BY MOUTH AT BEDTIME   vitamin B-12 (CYANOCOBALAMIN) 100 MCG tablet Take 100 mcg by mouth daily.   No facility-administered encounter medications on file as of 03/09/2022.     Lab Results  Component Value Date   WBC 10.1 01/30/2022   HGB 11.8 (L) 01/30/2022   HCT 35.2 (L) 01/30/2022   PLT 245.0 01/30/2022   GLUCOSE 82 01/30/2022   CHOL 149 10/13/2021   TRIG 100.0 10/13/2021   HDL 57.10 10/13/2021   LDLDIRECT 163.4 12/31/2012   LDLCALC 72 10/13/2021   ALT 8 11/05/2021   AST 16 11/05/2021   NA 137 01/30/2022   K 4.1 01/30/2022   CL 100 01/30/2022   CREATININE 1.13  01/30/2022   BUN 24 (H) 01/30/2022   CO2 28 01/30/2022   TSH 1.41 07/04/2021   INR 1.0 12/12/2012   HGBA1C 6.2 11/29/2021    US Venous Img Lower Unilateral Left  Result Date: 01/30/2022 CLINICAL DATA:  Pain and swelling EXAM: Left LOWER EXTREMITY VENOUS DOPPLER ULTRASOUND TECHNIQUE: Gray-scale sonography with compression, as well as color and duplex ultrasound, were performed to evaluate the deep venous system(s) from the level of the common femoral vein through the popliteal and proximal calf veins. COMPARISON:  None Available. FINDINGS: VENOUS Normal compressibility of the common femoral, superficial femoral, and popliteal veins, as well as the visualized calf veins. Visualized portions of profunda femoral vein and great saphenous vein unremarkable. No filling defects to suggest DVT on grayscale or color Doppler imaging. Doppler waveforms show normal direction of venous flow, normal respiratory plasticity and response to augmentation. Limited views of the contralateral common femoral vein are unremarkable. OTHER None. Limitations: none IMPRESSION: There is no evidence of deep venous thrombosis in left lower extremity. Electronically Signed   By: Elmer Picker M.D.   On: 01/30/2022 15:34       Assessment & Plan:   Problem List Items Addressed This Visit   None    Einar Pheasant, MD

## 2022-03-12 ENCOUNTER — Encounter: Payer: Self-pay | Admitting: Internal Medicine

## 2022-03-12 ENCOUNTER — Telehealth: Payer: Self-pay

## 2022-03-12 DIAGNOSIS — G20A1 Parkinson's disease without dyskinesia, without mention of fluctuations: Secondary | ICD-10-CM | POA: Insufficient documentation

## 2022-03-12 DIAGNOSIS — R252 Cramp and spasm: Secondary | ICD-10-CM | POA: Diagnosis not present

## 2022-03-12 DIAGNOSIS — R413 Other amnesia: Secondary | ICD-10-CM | POA: Diagnosis not present

## 2022-03-12 DIAGNOSIS — G4752 REM sleep behavior disorder: Secondary | ICD-10-CM | POA: Diagnosis not present

## 2022-03-12 DIAGNOSIS — G20A2 Parkinson's disease without dyskinesia, with fluctuations: Secondary | ICD-10-CM | POA: Diagnosis not present

## 2022-03-12 NOTE — Assessment & Plan Note (Signed)
S/p lobectomy.  Followed by pulmonary.  Breathing stable.

## 2022-03-12 NOTE — Assessment & Plan Note (Signed)
Crestor.  Follow lipid panel and liver function tests.

## 2022-03-12 NOTE — Assessment & Plan Note (Signed)
No acid reflux symptoms reported.  Prilosec.

## 2022-03-12 NOTE — Telephone Encounter (Signed)
Lvm for pt to return call in regards to lab results.  Per Dr.Scott: Notify Victoria Hanson that her kidney function has improved from last check.  Hgb improved and wnl.  Cholesterol increased some.  Confirm if taking crestor.  If so, need to know how she is taking.  If not taking, did she have problems taking.  Overall sugar control improved.  Thyroid , vitamin D level, B12 level and liver function tests are wnl.

## 2022-03-12 NOTE — Assessment & Plan Note (Signed)
Check vitamin d level

## 2022-03-12 NOTE — Assessment & Plan Note (Signed)
Diet and exercise.  Follow liver panel.

## 2022-03-12 NOTE — Assessment & Plan Note (Signed)
Has worsened since off sinemet.  Did not tolerate amantidine.  Has f/u with neurology next week.

## 2022-03-12 NOTE — Assessment & Plan Note (Signed)
Crestor. Low cholesterol diet and exercise.  Follow lipid panel and liver function tests.

## 2022-03-12 NOTE — Assessment & Plan Note (Signed)
Evaluated by [facility].  Continue crestor.

## 2022-03-12 NOTE — Assessment & Plan Note (Signed)
Discussed today.  On citalopram.  Would like to increase dose.  Increase to 20mg  q day.  Follow.

## 2022-03-12 NOTE — Assessment & Plan Note (Signed)
Blood pressure doing well on amlodipine and micardis.  Currently taking 7.5mg  amlodipine.  Follow pressures.  Follow metabolic panel.

## 2022-03-12 NOTE — Assessment & Plan Note (Signed)
Check cbc 

## 2022-03-12 NOTE — Assessment & Plan Note (Signed)
Crestor.  Low cholesterol diet and exercise. Follow lipid panel and liver function tests.

## 2022-03-12 NOTE — Assessment & Plan Note (Signed)
Low carb diet and exercise.  Follow met b and a1c.  

## 2022-03-12 NOTE — Assessment & Plan Note (Signed)
Avoid antiinflammatories.  Treat blood pressure.  Micardis.  Check metabolic panel.

## 2022-03-12 NOTE — Assessment & Plan Note (Signed)
Has been evaluated by [facility].  Continue risk factor modificaiton.

## 2022-03-12 NOTE — Assessment & Plan Note (Signed)
Recheck b12 level.

## 2022-03-15 ENCOUNTER — Other Ambulatory Visit: Payer: Self-pay

## 2022-03-15 MED ORDER — ROSUVASTATIN CALCIUM 5 MG PO TABS: ORAL_TABLET | ORAL | 1 refills | Status: DC

## 2022-03-15 NOTE — Telephone Encounter (Signed)
Patient returned office phone call and note was read. Patient has not been taking her Crestor. She states she had no issues taking Crestor and will go back on medication.

## 2022-03-15 NOTE — Telephone Encounter (Signed)
Does she have the crestor to take?  If no, then will need new rx.  She was prescribed 5mg  q Monday, Wednesday and Friday.  If send in rx, will need to be for 39 tablets with one refill with directions as above.

## 2022-03-15 NOTE — Telephone Encounter (Signed)
FYI patient did need refill & I have sent back in. Patient wanted you to know that neurology sent in Requip for her Parkinson's with dosing scheduled of 1 tablet daily for 7 days, 2 tablets for 7 days then 3 tablets there after.

## 2022-03-20 ENCOUNTER — Telehealth: Payer: Self-pay

## 2022-03-20 NOTE — Telephone Encounter (Signed)
Yes.  Let me know bp this pm and can determine what medication to take today.

## 2022-03-20 NOTE — Telephone Encounter (Signed)
I spoke with patient as well as daughter Victoria Hanson. Victoria Hanson stated that she was able to speak with access nurse who suggested ED for patient's BP. She said that her BP was 77/54 right after waking up & stood up to walk. She said that now her BP was up to 141/81 & patient was feel back to her normal. I looked on our schedule & no appointments here until Thursday/Friday this week.  Daughter also was asking about shock therapy & its benefits for Parkinson's disease. She said that they asked neurology but they just told her that would need to be discuss at a patient's next appointment in office which was November. I advised that they call back to see if appointment could be moved up or be placed on cancellation list. They wanted your thoughts however.

## 2022-03-20 NOTE — Telephone Encounter (Signed)
Patient's daughter, Arnetha Gula, called to state patient's blood pressure is fluctuating and it's really low right now, 77/54.  I transferred call to Access Nurse.  [name] was not connected when I transferred the call to access nurse.  Access Nurse will call patient's home number.

## 2022-03-20 NOTE — Telephone Encounter (Signed)
I spoke with patient & daughter Victoria Hanson. They were not at home to confirm what BP had been running but had not indicated that it had been low. Patient had not taken BP meds today & asked her to hold them. She also on takes lasix PRN & has not been taking lately. Daughter will call me back this afternoon so I can report back to you. If you will route this back I will document when she calls.

## 2022-03-20 NOTE — Telephone Encounter (Signed)
Need to know how her blood pressures have been running prior to today. Have they been low?  Need to confirm if taking blood pressure medication.  If taking lasix (furosemide) currently - have her stop.  Listed medications are telmisartan 40mg  and amlodipine 5mg  in am and 2.5mg  in pm.  If has already taken am medication, then have her hold amlodipine 2.5mg  this pm.  Monitor pressures and send in readings.  May need to adjust medication more.  Regarding the shock therapy - would need to get neurology's input.

## 2022-03-21 DIAGNOSIS — J449 Chronic obstructive pulmonary disease, unspecified: Secondary | ICD-10-CM | POA: Diagnosis not present

## 2022-03-21 DIAGNOSIS — G20A2 Parkinson's disease without dyskinesia, with fluctuations: Secondary | ICD-10-CM | POA: Diagnosis not present

## 2022-03-21 DIAGNOSIS — R0609 Other forms of dyspnea: Secondary | ICD-10-CM | POA: Diagnosis not present

## 2022-03-21 DIAGNOSIS — R600 Localized edema: Secondary | ICD-10-CM | POA: Diagnosis not present

## 2022-03-22 NOTE — Telephone Encounter (Signed)
Was able to call & follow-up with daughter since she never returned call. She said that she had forgotten and patient's BP was so good that she hadn't thought anymore about it. She stated that patient had not even taken her BP meds bc BP had been so good. She also had not had anymore dizziness with positional changes.  BP readings:  10/11: 101/72 10/12: 110/70

## 2022-03-22 NOTE — Telephone Encounter (Signed)
As long as blood pressure is controlled, then ok to hold medication.  Monitor blood pressure.  Let us know if any problems.

## 2022-03-23 NOTE — Telephone Encounter (Signed)
Pt called and she will continue to monitor BP unless any issues arise.

## 2022-03-27 DIAGNOSIS — H2513 Age-related nuclear cataract, bilateral: Secondary | ICD-10-CM | POA: Diagnosis not present

## 2022-03-30 ENCOUNTER — Ambulatory Visit (INDEPENDENT_AMBULATORY_CARE_PROVIDER_SITE_OTHER): Payer: Medicare Other | Admitting: Internal Medicine

## 2022-03-30 ENCOUNTER — Encounter: Payer: Self-pay | Admitting: Internal Medicine

## 2022-03-30 DIAGNOSIS — I700 Atherosclerosis of aorta: Secondary | ICD-10-CM | POA: Diagnosis not present

## 2022-03-30 DIAGNOSIS — D649 Anemia, unspecified: Secondary | ICD-10-CM

## 2022-03-30 DIAGNOSIS — K760 Fatty (change of) liver, not elsewhere classified: Secondary | ICD-10-CM | POA: Diagnosis not present

## 2022-03-30 DIAGNOSIS — N183 Chronic kidney disease, stage 3 unspecified: Secondary | ICD-10-CM | POA: Diagnosis not present

## 2022-03-30 DIAGNOSIS — K219 Gastro-esophageal reflux disease without esophagitis: Secondary | ICD-10-CM | POA: Diagnosis not present

## 2022-03-30 DIAGNOSIS — R739 Hyperglycemia, unspecified: Secondary | ICD-10-CM | POA: Diagnosis not present

## 2022-03-30 DIAGNOSIS — F339 Major depressive disorder, recurrent, unspecified: Secondary | ICD-10-CM

## 2022-03-30 DIAGNOSIS — G903 Multi-system degeneration of the autonomic nervous system: Secondary | ICD-10-CM

## 2022-03-30 DIAGNOSIS — Z8589 Personal history of malignant neoplasm of other organs and systems: Secondary | ICD-10-CM

## 2022-03-30 DIAGNOSIS — I10 Essential (primary) hypertension: Secondary | ICD-10-CM

## 2022-03-30 DIAGNOSIS — E7800 Pure hypercholesterolemia, unspecified: Secondary | ICD-10-CM | POA: Diagnosis not present

## 2022-03-30 DIAGNOSIS — I779 Disorder of arteries and arterioles, unspecified: Secondary | ICD-10-CM

## 2022-03-30 NOTE — Progress Notes (Signed)
yPatient ID: Victoria Hanson, female   DOB: 08/23/1950, 71 y.o.   MRN: [id number]   Subjective:    Patient ID: Victoria Hanson, female    DOB: Oct 23, 1950, 71 y.o.   MRN: [id number]   Patient here for  Chief Complaint  Patient presents with   Follow-up   .   HPI Here to follow up regarding her blood pressure and parkinsons.  Her parkinsons medication has recently been stopped secondary to intolerance.  Since off, parkinsons worsening.  Unable to get around as well.  Using rollator.  No falls.  No chest pain.  Breathing stable.  Able to eat.  Decreased weight.  Blood pressure varying with parkinsons medication.  No headache.    Past Medical History:  Diagnosis Date   Anxiety    Asthma    Atypical chest pain    a. 2003 Cath: reportedly nl per pt [name]);  b. 04/2015 [facility] Admission, neg troponin.   Colon cancer ([place]) 1999   a. 1999: Pt had partial colectomy. Did develop lung metastasis. Had right upper lobectomy in 01/2007 then had associated chemotherapy.   COPD (chronic obstructive pulmonary disease) (HCC)    DDD (degenerative disc disease), lumbosacral    Depression    Echocardiogram abnormal    a. 02/2010 Echo: EF 55-60%, mild LAE, no regional wall motion abnormalities   Elevated transaminase level    a. ? NASH;  b. 06/2015 - nl LFTs.   GERD (gastroesophageal reflux disease)    Hematuria    Holter monitor, abnormal    a. 12/2009 Holter monitoring: NSR, rare PACs and PVCs.   Hypercholesterolemia    Hyperlipidemia    Hypertension    Leaky heart valve    a. Per pt report - no evidence of valvular abnormalities on prior echoes.   Lesion of skin of Right breast 10/20/2017   Lung metastases 2008   a. S/P resection (lobectomy - right)   Morbid obesity ([place])    Nephrolithiasis    Parkinson disease 08/2016   [name]   Past Surgical History:  Procedure Laterality Date   BREAST EXCISIONAL BIOPSY Right 10/07/2009   benign   CARDIAC CATHETERIZATION  2003   negative as per pt  report   CHOLECYSTECTOMY  2000   COLON SURGERY     Colon cancer, removed part of large colon   COLONOSCOPY N/A 04/25/2015   Procedure: COLONOSCOPY;  Surgeon: Manya Silvas, MD;  Location: [facility];  Service: Endoscopy;  Laterality: N/A;   COLONOSCOPY WITH PROPOFOL N/A 06/14/2017   Procedure: COLONOSCOPY WITH PROPOFOL;  Surgeon: Manya Silvas, MD;  Location: [facility] ENDOSCOPY;  Service: Endoscopy;  Laterality: N/A;   COLONOSCOPY WITH PROPOFOL N/A 11/17/2019   Procedure: COLONOSCOPY WITH PROPOFOL;  Surgeon: [name], MD;  Location[facility] ENDOSCOPY;  Service: Gastroenterology;  Laterality: N/A;   COLONOSCOPY WITH PROPOFOL N/A 03/21/2021   Procedure: COLONOSCOPY WITH PROPOFOL;  Surgeon: Lesly Rubenstein, MD;  Location: [facility] ENDOSCOPY;  Service: Endoscopy;  Laterality: N/A;   ESOPHAGOGASTRODUODENOSCOPY (EGD) WITH PROPOFOL N/A 03/21/2021   Procedure: ESOPHAGOGASTRODUODENOSCOPY (EGD) WITH PROPOFOL;  Surgeon: Lesly Rubenstein, MD;  Location: [facility] ENDOSCOPY;  Service: Endoscopy;  Laterality: N/A;   LOBECTOMY  01/2007   Right lung, upper lobe right side removed   TOTAL ABDOMINAL HYSTERECTOMY     abnormal bleeding   TUBAL LIGATION  1980   Family History  Problem Relation Age of Onset   Coronary artery disease Mother        died @  37 of MI.   Mental illness Mother    Diabetes Mother    Heart attack Father 3       MI   Cancer Father        lung - died in his mid-70's   Hypertension Father    Diabetes Father    Sudden death Brother    Mental illness Brother    Heart attack Brother        MI @ 34, died @ age 58.   Breast cancer Neg Hx    Social History   Socioeconomic History   Marital status: Widowed    Spouse name: Not on file   Number of children: Not on file   Years of education: Not on file   Highest education level: Not on file  Occupational History   Not on file  Tobacco Use   Smoking status: Never    Passive exposure: Yes   Smokeless tobacco: Never    Tobacco comments:    husband and son smoked in her home.  Vaping Use   Vaping Use: Never used  Substance and Sexual Activity   Alcohol use: Not Currently    Alcohol/week: 0.0 standard drinks of alcohol   Drug use: No   Sexual activity: Never  Other Topics Concern   Not on file  Social History Narrative   Widowed.   Disabled (colon/lung CA), retired.   Lives in [place] with her dtr, grand-dtr, and son.   Active.   Gets regular exercise, walks.   Social Determinants of Health   Financial Resource Strain: Low Risk  (11/29/2021)   Overall Financial Resource Strain (CARDIA)    Difficulty of Paying Living Expenses: Not hard at all  Food Insecurity: No Food Insecurity (11/29/2021)   Hunger Vital Sign    Worried About Running Out of Food in the Last Year: Never true    Ran Out of Food in the Last Year: Never true  Transportation Needs: No Transportation Needs (11/29/2021)   PRAPARE - Hydrologist (Medical): No    Lack of Transportation (Non-Medical): No  Physical Activity: Not on file  Stress: No Stress Concern Present (11/29/2021)   [place]    Feeling of Stress : Only a little  Social Connections: Unknown (11/29/2021)   Social Connection and Isolation Panel [NHANES]    Frequency of Communication with Friends and Family: More than three times a week    Frequency of Social Gatherings with Friends and Family: More than three times a week    Attends Religious Services: Not on Advertising copywriter or Organizations: Not on file    Attends Archivist Meetings: Not on file    Marital Status: Not on file     Review of Systems  Constitutional:        Fatigue.  Weight loss.   HENT:  Negative for congestion and sinus pressure.   Respiratory:  Negative for cough, chest tightness and shortness of breath.   Cardiovascular:  Negative for chest pain, palpitations and leg  swelling.  Gastrointestinal:  Negative for abdominal pain, nausea and vomiting.  Genitourinary:  Negative for difficulty urinating and dysuria.  Musculoskeletal:  Positive for gait problem. Negative for joint swelling and myalgias.  Skin:  Negative for color change and rash.  Neurological:  Negative for seizures and headaches.  Psychiatric/Behavioral:  Negative for agitation and hallucinations.  Objective:     BP 128/72 (BP Location: Left Arm, Patient Position: Sitting, Cuff Size: Normal)   Pulse 78   Temp 98.1 F (36.7 C) (Oral)   Ht $R'5\' 4"'SW$  (1.626 m)   Wt 174 lb 3.2 oz (79 kg)   SpO2 98%   BMI 29.90 kg/m  Wt Readings from Last 3 Encounters:  03/30/22 174 lb 3.2 oz (79 kg)  03/09/22 177 lb 4.8 oz (80.4 kg)  03/02/22 182 lb 9.6 oz (82.8 kg)    Physical Exam Vitals reviewed.  Constitutional:      General: She is not in acute distress.    Appearance: Normal appearance.  HENT:     Head: Normocephalic and atraumatic.     Right Ear: External ear normal.     Left Ear: External ear normal.  Eyes:     General: No scleral icterus.       Right eye: No discharge.        Left eye: No discharge.     Conjunctiva/sclera: Conjunctivae normal.  Neck:     Thyroid: No thyromegaly.  Cardiovascular:     Rate and Rhythm: Normal rate and regular rhythm.  Pulmonary:     Effort: No respiratory distress.     Breath sounds: Normal breath sounds. No wheezing.  Abdominal:     General: Bowel sounds are normal.     Palpations: Abdomen is soft.     Tenderness: There is no abdominal tenderness.  Musculoskeletal:        General: No swelling or tenderness.     Cervical back: Neck supple. No tenderness.  Lymphadenopathy:     Cervical: No cervical adenopathy.  Skin:    Findings: No erythema or rash.  Neurological:     Mental Status: She is alert.  Psychiatric:        Mood and Affect: Mood normal.        Behavior: Behavior normal.      Outpatient Encounter Medications as of  03/30/2022  Medication Sig   aspirin EC 81 MG tablet Take 81 mg by mouth as needed.    calcium-vitamin D (OSCAL WITH D) 500-200 MG-UNIT tablet Take 1 tablet by mouth.   citalopram (CELEXA) 20 MG tablet Take 1 tablet (20 mg total) by mouth daily.   fluticasone-salmeterol (ADVAIR HFA) 115-21 MCG/ACT inhaler Inhale 2 puffs into the lungs 2 (two) times daily.   furosemide (LASIX) 20 MG tablet Take 1 tablet (20 mg total) by mouth daily as needed for edema.   PROAIR HFA 108 (90 Base) MCG/ACT inhaler INHALE 2 PUFFS BY MOUTH INTO THE LUNGS EVERY 6 HOURS AS NEEDED FOR WHEEZING   rosuvastatin (CRESTOR) 5 MG tablet TAKE 1 TABLET BY MOUTH EVERY MONDAY, WEDNESDAY AND FRIDAY   vitamin B-12 (CYANOCOBALAMIN) 100 MCG tablet Take 100 mcg by mouth daily.   [DISCONTINUED] ALPRAZolam (XANAX) 0.5 MG tablet TAKE 1/2 TABLET BY MOUTH ONCE DAILY AS NEEDED   [DISCONTINUED] montelukast (SINGULAIR) 10 MG tablet TAKE 1 TABLET BY MOUTH AT BEDTIME   [DISCONTINUED] traZODone (DESYREL) 50 MG tablet TAKE 2 TABLETS BY MOUTH AT BEDTIME   amLODipine (NORVASC) 5 MG tablet Take 1.5 tablets (7.5 mg total) by mouth daily. (5 mg + 2.5 mg qd) (Patient not taking: Reported on 03/30/2022)   fluticasone (FLONASE) 50 MCG/ACT nasal spray Place 2 sprays into both nostrils daily.   [DISCONTINUED] clobetasol cream (TEMOVATE) 3.53 % Apply 1 Application topically 2 (two) times daily. Arms and legs prn x 7-14 days (Patient not taking:  Reported on 03/30/2022)   [DISCONTINUED] mupirocin ointment (BACTROBAN) 2 % Apply 1 Application topically 2 (two) times daily. Left arm (Patient not taking: Reported on 03/30/2022)   [DISCONTINUED] omeprazole (PRILOSEC) 20 MG capsule Take 1 capsule (20 mg total) by mouth daily. (Patient not taking: Reported on 03/30/2022)   [DISCONTINUED] OXYGEN Inhale into the lungs. PRN (Patient not taking: Reported on 03/30/2022)   [DISCONTINUED] telmisartan (MICARDIS) 40 MG tablet Take 1 tablet (40 mg total) by mouth daily. Around 6  pm (Patient not taking: Reported on 03/30/2022)   No facility-administered encounter medications on file as of 03/30/2022.     Lab Results  Component Value Date   WBC 10.9 (H) 03/09/2022   HGB 12.6 03/09/2022   HCT 38.6 03/09/2022   PLT 295.0 03/09/2022   GLUCOSE 96 03/09/2022   CHOL 207 (H) 03/09/2022   TRIG 77.0 03/09/2022   HDL 72.60 03/09/2022   LDLDIRECT 163.4 12/31/2012   LDLCALC 119 (H) 03/09/2022   ALT 6 03/09/2022   AST 10 03/09/2022   NA 141 03/09/2022   K 4.1 03/09/2022   CL 105 03/09/2022   CREATININE 1.08 03/09/2022   BUN 25 (H) 03/09/2022   CO2 26 03/09/2022   TSH 1.67 03/09/2022   INR 1.0 12/12/2012   HGBA1C 5.9 03/09/2022    US Venous Img Lower Unilateral Left  Result Date: 01/30/2022 CLINICAL DATA:  Pain and swelling EXAM: Left LOWER EXTREMITY VENOUS DOPPLER ULTRASOUND TECHNIQUE: Gray-scale sonography with compression, as well as color and duplex ultrasound, were performed to evaluate the deep venous system(s) from the level of the common femoral vein through the popliteal and proximal calf veins. COMPARISON:  None Available. FINDINGS: VENOUS Normal compressibility of the common femoral, superficial femoral, and popliteal veins, as well as the visualized calf veins. Visualized portions of profunda femoral vein and great saphenous vein unremarkable. No filling defects to suggest DVT on grayscale or color Doppler imaging. Doppler waveforms show normal direction of venous flow, normal respiratory plasticity and response to augmentation. Limited views of the contralateral common femoral vein are unremarkable. OTHER None. Limitations: none IMPRESSION: There is no evidence of deep venous thrombosis in left lower extremity. Electronically Signed   By: Elmer Picker M.D.   On: 01/30/2022 15:34       Assessment & Plan:   Problem List Items Addressed This Visit     Anemia    Follow cbc.       Aortic atherosclerosis (HCC)    Crestor.  Follow lipid panel and  liver function tests.        Carotid artery disease ([place])    Evaluated by [facility].  Continue crestor.        CKD (chronic kidney disease), stage III (HCC)    Avoid antiinflammatories.  Follow pressures.  Off micardis due to low blood pressure.  Follow. Follow metabolic panel.       Depression, recurrent (HCC)    Continue citalopram.   Follow.       GERD (gastroesophageal reflux disease)    No acid reflux symptoms reported.  Off PPI.       Hepatic steatosis    Diet and exercise.  Follow liver panel.       History of malignant neoplasm metastatic to lung    S/p lobectomy.  Followed by pulmonary.  Breathing stable.       Hyperglycemia    Low carb diet and exercise.  Follow met b and a1c.       Hypertension  Off now.  Blood pressure has varied with parkinsons medication.  Will take amlodipine prn.  Follow pressures.  Follow metabolic panel.       Orthostatic hypotension due to Parkinson's disease ([place])    Blood pressure has been varying with parkinsons medication.  Off now.  parkinsons worsening.  Discus with neurology - other treatment options.   Follow pressures.       Pure hypercholesterolemia    Crestor. Low cholesterol diet and exercise.  Follow lipid panel and liver function tests.         Einar Pheasant, MD

## 2022-04-03 ENCOUNTER — Other Ambulatory Visit: Payer: Self-pay | Admitting: Internal Medicine

## 2022-04-04 ENCOUNTER — Telehealth: Payer: Self-pay

## 2022-04-04 ENCOUNTER — Telehealth: Payer: Self-pay | Admitting: Internal Medicine

## 2022-04-04 NOTE — Telephone Encounter (Signed)
I have attempted to call pt calls will not go through. I have attempted to call the daughter and the son neither one of them have voicemail boxes set up it rings and it states "unavailable please try your call again"     I called to give info to pt based of what Dr. Nicki Reaper advised:   Einar Pheasant, MD  Denita Lung A, CMA 40 minutes ago (3:23 PM)    I did speak with Dr Manuella Ghazi and he had stated they may try a different medication.  His office may be contacting her.  If she does not hear, I recommend her calling and scheduling a f/u.  Regarding her weakness and low blood pressure, have her remain off amlodipine.  If persistent dizziness and low blood pressure, will need to be seen.       Note     You  Einar Pheasant, MD 3 hours ago (12:06 PM)    Spoke with pt she stated she has not heard from you or Dr. Brigitte Pulse she stated that you mentioned you would be reaching out to him about her parkinson's. Pt then stated that her blood pressure has been running low she gave me two readings 1st: 91/66 and 2nd: 86/58 pt stated she is also feeling very dizzy this has been happening since yesterday and that she feels she will pass out. I advised the pt that she should be evaluated as soon as possible either at an urgent care or ER pt stated she would like to wait to see how she feels before going to the ER.    Burt Knack D routed conversation to Denita Lung A, CMA 6 hours ago (9:42 AM)   Burt Knack D 6 hours ago (9:42 AM)   AD Pt called stating she has not heard back from Dr. Brigitte Pulse office

## 2022-04-04 NOTE — Telephone Encounter (Signed)
I have attempted to call pt calls will not go through. I have attempted to call the daughter and the son neither one of them have voicemail boxes set up it rings and it states "unavailable please try your call again"

## 2022-04-04 NOTE — Telephone Encounter (Signed)
Pt called stating she has not heard back from Dr. Brigitte Pulse office

## 2022-04-04 NOTE — Telephone Encounter (Signed)
I did speak with Dr Manuella Ghazi and he had stated they may try a different medication.  His office may be contacting her.  If she does not hear, I recommend her calling and scheduling a f/u.  Regarding her weakness and low blood pressure, have her remain off amlodipine.  If persistent dizziness and low blood pressure, will need to be seen.

## 2022-04-05 ENCOUNTER — Telehealth: Payer: Self-pay

## 2022-04-05 NOTE — Telephone Encounter (Signed)
I called patient back to offer Access Nurse if her head still felt funny, but patient states her blood pressure is now 102/72 and her head is no longer hurting.

## 2022-04-05 NOTE — Telephone Encounter (Signed)
Patient states she is calling us back.  I read Dr. Randell Patient Scott's note to patient.  I gave patient Dr. Guinevere Scarlet phone number.  Patient states she did not take her amLODipine (NORVASC) 5 MG tablet when her blood pressure was low yesterday.  Patient states today her blood pressure was 138/98, so she did take her medication after she had this blood pressure reading, which was about 30 minutes ago.    Patient states she does not think she is still dizzy, but she does not feel right in her head.  Patient states the top of her head, back, and neck hurt.  Patient states Dr. Einar Pheasant has already seen her for the neck and back pain.  Patient states Dr. Trena Platt office was supposed to call in some gabapentin for her but they have not gotten back with her.  Patient states she needs all of her medications refilled.  Patient states her preferred pharmacy is [facility].

## 2022-04-05 NOTE — Telephone Encounter (Signed)
Victoria Hanson asked that we please call her back at [phone].

## 2022-04-05 NOTE — Telephone Encounter (Signed)
Called pt to get more information after her talks with Jinny Sanders to [facility]

## 2022-04-05 NOTE — Telephone Encounter (Signed)
LMOM for pt to CB to get more info on how she is feeling

## 2022-04-05 NOTE — Telephone Encounter (Signed)
Patient's daughter, Arnetha Gula, returned our call.  I was unable to reach Gracy Racer, CMA, at the time of the call.  I let [name] know that I will send a message to Shona Simpson to let her know she returned her call.

## 2022-04-06 NOTE — Telephone Encounter (Signed)
Rx ok'd for medication rerills.

## 2022-04-06 NOTE — Telephone Encounter (Signed)
Patient called to check on her medication refills. [place] office advised patient that Dr Bary Leriche CMA was trying to contact her. Tried to get CMA on phone, unavailable. Patient was advised that CMA would be contacting her sometime today.

## 2022-04-06 NOTE — Telephone Encounter (Signed)
This was in regards to refills they have been sent in on today

## 2022-04-06 NOTE — Telephone Encounter (Signed)
Spoke with pt in regards to see how she was feeling and if she heard from Dr. Manuella Ghazi pt stated she did hear from Dr. Manuella Ghazi and they have called in some patches to help with her parkinson's. She states she is fine at the moment.

## 2022-04-08 ENCOUNTER — Encounter: Payer: Self-pay | Admitting: Internal Medicine

## 2022-04-08 NOTE — Assessment & Plan Note (Signed)
Crestor. Low cholesterol diet and exercise.  Follow lipid panel and liver function tests.

## 2022-04-08 NOTE — Assessment & Plan Note (Signed)
Off now.  Blood pressure has varied with parkinsons medication.  Will take amlodipine prn.  Follow pressures.  Follow metabolic panel.

## 2022-04-08 NOTE — Assessment & Plan Note (Signed)
Avoid antiinflammatories.  Follow pressures.  Off micardis due to low blood pressure.  Follow. Follow metabolic panel.

## 2022-04-08 NOTE — Assessment & Plan Note (Signed)
S/p lobectomy.  Followed by pulmonary.  Breathing stable.

## 2022-04-08 NOTE — Assessment & Plan Note (Signed)
Diet and exercise.  Follow liver panel.

## 2022-04-08 NOTE — Assessment & Plan Note (Signed)
Follow cbc.  

## 2022-04-08 NOTE — Assessment & Plan Note (Signed)
No acid reflux symptoms reported.  Off PPI.

## 2022-04-08 NOTE — Assessment & Plan Note (Signed)
Low carb diet and exercise.  Follow met b and a1c.

## 2022-04-08 NOTE — Assessment & Plan Note (Signed)
Continue citalopram.   Follow.

## 2022-04-08 NOTE — Assessment & Plan Note (Signed)
Evaluated by [facility].  Continue crestor.

## 2022-04-08 NOTE — Assessment & Plan Note (Signed)
Crestor.  Follow lipid panel and liver function tests.

## 2022-04-08 NOTE — Assessment & Plan Note (Signed)
Blood pressure has been varying with parkinsons medication.  Off now.  parkinsons worsening.  Discus with neurology - other treatment options.   Follow pressures.

## 2022-04-09 NOTE — Telephone Encounter (Signed)
Pt is sched w/ Dr Nicki Reaper 11/1

## 2022-04-11 ENCOUNTER — Ambulatory Visit (INDEPENDENT_AMBULATORY_CARE_PROVIDER_SITE_OTHER): Payer: Medicare Other | Admitting: Internal Medicine

## 2022-04-11 ENCOUNTER — Encounter: Payer: Self-pay | Admitting: Internal Medicine

## 2022-04-11 VITALS — BP 144/90 | HR 73 | Ht 64.0 in | Wt 173.4 lb

## 2022-04-11 DIAGNOSIS — R600 Localized edema: Secondary | ICD-10-CM

## 2022-04-11 DIAGNOSIS — G20A1 Parkinson's disease without dyskinesia, without mention of fluctuations: Secondary | ICD-10-CM

## 2022-04-11 DIAGNOSIS — E7800 Pure hypercholesterolemia, unspecified: Secondary | ICD-10-CM

## 2022-04-11 DIAGNOSIS — I700 Atherosclerosis of aorta: Secondary | ICD-10-CM | POA: Diagnosis not present

## 2022-04-11 DIAGNOSIS — N183 Chronic kidney disease, stage 3 unspecified: Secondary | ICD-10-CM | POA: Diagnosis not present

## 2022-04-11 DIAGNOSIS — G903 Multi-system degeneration of the autonomic nervous system: Secondary | ICD-10-CM

## 2022-04-11 DIAGNOSIS — R739 Hyperglycemia, unspecified: Secondary | ICD-10-CM

## 2022-04-11 DIAGNOSIS — D649 Anemia, unspecified: Secondary | ICD-10-CM | POA: Diagnosis not present

## 2022-04-11 DIAGNOSIS — K219 Gastro-esophageal reflux disease without esophagitis: Secondary | ICD-10-CM | POA: Diagnosis not present

## 2022-04-11 DIAGNOSIS — F339 Major depressive disorder, recurrent, unspecified: Secondary | ICD-10-CM

## 2022-04-11 DIAGNOSIS — I10 Essential (primary) hypertension: Secondary | ICD-10-CM

## 2022-04-11 DIAGNOSIS — F419 Anxiety disorder, unspecified: Secondary | ICD-10-CM

## 2022-04-11 MED ORDER — NEUPRO 1 MG/24HR TD PT24: 1.0000 | MEDICATED_PATCH | Freq: Every day | TRANSDERMAL | 1 refills | Status: DC

## 2022-04-11 NOTE — Progress Notes (Signed)
Patient ID: Victoria Hanson, female   DOB: 02/14/1951, 71 y.o.   MRN: [id number]   Subjective:    Patient ID: Victoria Hanson, female    DOB: 09-Jan-1951, 71 y.o.   MRN: [id number]   Patient here for  Chief Complaint  Patient presents with   Follow-up    Follow up on low blood pressure and dizziness   .   HPI She is accompanied by her daughter.  History obtained from both of them.  Has had issues with labile blood pressure.  Has related that to her parkinsons medication.  Discontinued ropinirole secondary to hypotension.  Intolerance to sinemet.  Recently was placed on Neupro 58m.  Felt this was helping, but feels is contributing to pedal edema.  Discussed other possibilities of swelling, but she reports did not start until she started this medication.  Discussed my recommendation to try and stay on the medication, given she noted improvement.  Discussed decreasing the dose of the patch.  She is agreeable.  Has f/u with neurology this week.  No chest pain.  Breathing stable.  No increased cough or congestion. No vomiting.  No bowel problem reported.    Past Medical History:  Diagnosis Date   Anxiety    Asthma    Atypical chest pain    a. 2003 Cath: reportedly nl per pt ([name];  [place] [facility] [facility]   [place]   a. [place]: Pt had partial colectomy. Did develop lung metastasis. Had right upper lobectomy in 01/2007 then had associated chemotherapy.   COPD (chronic obstructive pulmonary disease) (HCC)    DDD (degenerative disc disease), lumbosacral    Depression    Echocardiogram abnormal    a. 02/2010 Echo: EF 55-60%, mild LAE, no regional wall motion abnormalities   Elevated transaminase level    a. ? NASH;  b. 06/2015 - nl LFTs.   GERD (gastroesophageal reflux disease)    Hematuria    Holter monitor, abnormal    a. 12/2009 Holter monitoring: NSR, rare PACs and PVCs.   Hypercholesterolemia    Hyperlipidemia    Hypertension    Leaky heart valve     a. Per pt report - no evidence of valvular abnormalities on prior echoes.   Lesion of skin of Right breast 10/20/2017   Lung metastases 2008   a. S/P resection (lobectomy - right)   [place]    Nephrolithiasis    Parkinson disease 08/2016   [name]   Past Surgical History:  Procedure Laterality Date   BREAST EXCISIONAL BIOPSY Right 10/07/2009   benign   CARDIAC CATHETERIZATION  2003   negative as per pt report   CHOLECYSTECTOMY  2000   COLON SURGERY     Colon cancer, removed part of large colon   COLONOSCOPY N/A 04/25/2015   Procedure: COLONOSCOPY;  Surgeon: RManya Silvas MD;  [facility]  Service: Endoscopy;  Laterality: N/A;   COLONOSCOPY WITH PROPOFOL N/A 06/14/2017   Procedure: COLONOSCOPY WITH PROPOFOL;  Surgeon: EManya Silvas MD;  [facility]  Service: Endoscopy;  Laterality: N/A;   COLONOSCOPY WITH PROPOFOL N/A 11/17/2019   Procedure: COLONOSCOPY WITH PROPOFOL;  Surgeon: AJonathon Bellows MD;  [facility]  Service: Gastroenterology;  Laterality: N/A;   COLONOSCOPY WITH PROPOFOL N/A 03/21/2021   Procedure: COLONOSCOPY WITH PROPOFOL;  Surgeon: LLesly Rubenstein MD;  Location: [facility] ENDOSCOPY;  Service: Endoscopy;  Laterality: N/A;   ESOPHAGOGASTRODUODENOSCOPY (EGD) WITH PROPOFOL N/A 03/21/2021  Procedure: ESOPHAGOGASTRODUODENOSCOPY (EGD) WITH PROPOFOL;  Surgeon: Lesly Rubenstein, MD;  Location: [facility] ENDOSCOPY;  Service: Endoscopy;  Laterality: N/A;   LOBECTOMY  01/2007   Right lung, upper lobe right side removed   TOTAL ABDOMINAL HYSTERECTOMY     abnormal bleeding   TUBAL LIGATION  1980   Family History  Problem Relation Age of Onset   Coronary artery disease Mother        died @ 40 of MI.   Mental illness Mother    Diabetes Mother    Heart attack Father 88       MI   Cancer Father        lung - died in his mid-70's   Hypertension Father    Diabetes Father    Sudden death Brother    Mental illness Brother     Heart attack Brother        MI @ 2, died @ age 73.   Breast cancer Neg Hx    Social History   Socioeconomic History   Marital status: Widowed    Spouse name: Not on file   Number of children: Not on file   Years of education: Not on file   Highest education level: Not on file  Occupational History   Not on file  Tobacco Use   Smoking status: Never    Passive exposure: Yes   Smokeless tobacco: Never   Tobacco comments:    husband and son smoked in her home.  Vaping Use   Vaping Use: Never used  Substance and Sexual Activity   Alcohol use: Not Currently    Alcohol/week: 0.0 standard drinks of alcohol   Drug use: No   Sexual activity: Never  Other Topics Concern   Not on file  Social History Narrative   Widowed.   Disabled (colon/lung CA), retired.   Lives in [place] with her dtr, grand-dtr, and son.   Active.   Gets regular exercise, walks.   Social Determinants of Health   Financial Resource Strain: Low Risk  (11/29/2021)   Overall Financial Resource Strain (CARDIA)    Difficulty of Paying Living Expenses: Not hard at all  Food Insecurity: No Food Insecurity (11/29/2021)   Hunger Vital Sign    Worried About Running Out of Food in the Last Year: Never true    Ran Out of Food in the Last Year: Never true  Transportation Needs: No Transportation Needs (11/29/2021)   PRAPARE - Hydrologist (Medical): No    Lack of Transportation (Non-Medical): No  Physical Activity: Not on file  Stress: No Stress Concern Present (11/29/2021)   [place]    Feeling of Stress : Only a little  Social Connections: Unknown (11/29/2021)   Social Connection and Isolation Panel [NHANES]    Frequency of Communication with Friends and Family: More than three times a week    Frequency of Social Gatherings with Friends and Family: More than three times a week    Attends Religious Services: Not  on Advertising copywriter or Organizations: Not on file    Attends Archivist Meetings: Not on file    Marital Status: Not on file     Review of Systems  Constitutional:  Positive for fatigue. Negative for appetite change.  HENT:  Negative for congestion and sinus pressure.   Respiratory:  Negative for cough and chest tightness.  Breathing stable.   Cardiovascular:  Negative for chest pain and palpitations.       Pedal edema.   Gastrointestinal:  Negative for abdominal pain, diarrhea, nausea and vomiting.  Genitourinary:  Negative for difficulty urinating and dysuria.  Musculoskeletal:  Negative for joint swelling and myalgias.  Skin:  Negative for color change and rash.  Neurological:  Negative for dizziness and headaches.  Psychiatric/Behavioral:  Negative for agitation and dysphoric mood.        Objective:     BP (!) 144/90 (BP Location: Left Arm, Patient Position: Sitting, Cuff Size: Normal)   Pulse 73   Ht _0  (1.626 m)   Wt 173 lb 6.4 oz (78.7 kg)   SpO2 98%   BMI 29.76 kg/m  Wt Readings from Last 3 Encounters:  04/11/22 173 lb 6.4 oz (78.7 kg)  03/30/22 174 lb 3.2 oz (79 kg)  03/09/22 177 lb 4.8 oz (80.4 kg)    Physical Exam Vitals reviewed.  Constitutional:      General: She is not in acute distress.    Appearance: Normal appearance.  HENT:     Head: Normocephalic and atraumatic.     Right Ear: External ear normal.     Left Ear: External ear normal.  Eyes:     General: No scleral icterus.       Right eye: No discharge.        Left eye: No discharge.     Conjunctiva/sclera: Conjunctivae normal.  Neck:     Thyroid: No thyromegaly.  Cardiovascular:     Rate and Rhythm: Normal rate and regular rhythm.  Pulmonary:     Effort: No respiratory distress.     Breath sounds: Normal breath sounds. No wheezing.  Abdominal:     General: Bowel sounds are normal.     Palpations: Abdomen is soft.     Tenderness: There is no abdominal  tenderness.  Musculoskeletal:        General: No tenderness.     Cervical back: Neck supple. No tenderness.     Comments: Pedal edema.   Lymphadenopathy:     Cervical: No cervical adenopathy.  Skin:    Findings: No erythema or rash.  Neurological:     Mental Status: She is alert.  Psychiatric:        Mood and Affect: Mood normal.        Behavior: Behavior normal.      Outpatient Encounter Medications as of 04/11/2022  Medication Sig   ALPRAZolam (XANAX) 0.5 MG tablet TAKE 1/2 TABLET BY MOUTH ONCE DAILY AS NEEDED   amLODipine (NORVASC) 5 MG tablet Take 1.5 tablets (7.5 mg total) by mouth daily. (5 mg + 2.5 mg qd)   aspirin EC 81 MG tablet Take 81 mg by mouth as needed.    calcium-vitamin D (OSCAL WITH D) 500-200 MG-UNIT tablet Take 1 tablet by mouth.   citalopram (CELEXA) 10 MG tablet TAKE ONE AND ONE-HALF TABLET BY MOUTH DAILY   citalopram (CELEXA) 20 MG tablet Take 1 tablet (20 mg total) by mouth daily.   fluticasone-salmeterol (ADVAIR HFA) 115-21 MCG/ACT inhaler Inhale 2 puffs into the lungs 2 (two) times daily.   furosemide (LASIX) 20 MG tablet Take 1 tablet (20 mg total) by mouth daily as needed for edema.   montelukast (SINGULAIR) 10 MG tablet TAKE 1 TABLET BY MOUTH AT BEDTIME   PROAIR HFA 108 (90 Base) MCG/ACT inhaler INHALE 2 PUFFS BY MOUTH INTO THE LUNGS EVERY 6 HOURS AS NEEDED  FOR WHEEZING   rosuvastatin (CRESTOR) 5 MG tablet TAKE 1 TABLET BY MOUTH EVERY MONDAY, WEDNESDAY AND FRIDAY   Rotigotine (NEUPRO) 1 MG/24HR PT24 Place 1 patch (1 mg total) onto the skin daily.   traZODone (DESYREL) 50 MG tablet TAKE 2 TABLETS BY MOUTH AT BEDTIME   vitamin B-12 (CYANOCOBALAMIN) 100 MCG tablet Take 100 mcg by mouth daily.   fluticasone (FLONASE) 50 MCG/ACT nasal spray Place 2 sprays into both nostrils daily.   [DISCONTINUED] NEUPRO 2 MG/24HR 1 patch at bedtime.   No facility-administered encounter medications on file as of 04/11/2022.     Lab Results  Component Value Date   WBC  10.9 (H) 03/09/2022   HGB 12.6 03/09/2022   HCT 38.6 03/09/2022   PLT 295.0 03/09/2022   GLUCOSE 96 03/09/2022   CHOL 207 (H) 03/09/2022   TRIG 77.0 03/09/2022   HDL 72.60 03/09/2022   LDLDIRECT 163.4 12/31/2012   LDLCALC 119 (H) 03/09/2022   ALT 6 03/09/2022   AST 10 03/09/2022   NA 141 03/09/2022   K 4.1 03/09/2022   CL 105 03/09/2022   CREATININE 1.08 03/09/2022   BUN 25 (H) 03/09/2022   CO2 26 03/09/2022   TSH 1.67 03/09/2022   INR 1.0 12/12/2012   HGBA1C 5.9 03/09/2022    US Venous Img Lower Unilateral Left  Result Date: 01/30/2022 CLINICAL DATA:  Pain and swelling EXAM: Left LOWER EXTREMITY VENOUS DOPPLER ULTRASOUND TECHNIQUE: Gray-scale sonography with compression, as well as color and duplex ultrasound, were performed to evaluate the deep venous system(s) from the level of the common femoral vein through the popliteal and proximal calf veins. COMPARISON:  None Available. FINDINGS: VENOUS Normal compressibility of the common femoral, superficial femoral, and popliteal veins, as well as the visualized calf veins. Visualized portions of profunda femoral vein and great saphenous vein unremarkable. No filling defects to suggest DVT on grayscale or color Doppler imaging. Doppler waveforms show normal direction of venous flow, normal respiratory plasticity and response to augmentation. Limited views of the contralateral common femoral vein are unremarkable. OTHER None. Limitations: none IMPRESSION: There is no evidence of deep venous thrombosis in left lower extremity. Electronically Signed   By: Elmer Picker M.D.   On: 01/30/2022 15:34       Assessment & Plan:   Problem List Items Addressed This Visit     Acid reflux    Upper symptoms appear to be controlled on prilosec.  Follow.       Anemia    Follow cbc.       Anxiety, mild    Continue celexa.  Follow. Stable.       Aortic atherosclerosis (HCC)    Crestor.  Follow lipid panel and liver function tests.         CKD (chronic kidney disease), stage III (HCC)    Avoid antiinflammatories.  Follow pressures.  Off micardis due to low blood pressure.  Follow. Follow metabolic panel.       Depression, recurrent (HCC)    Continue citalopram.   Follow.       Hypercholesterolemia    Crestor.  Low cholesterol diet and exercise. Follow lipid panel and liver function tests.       Hyperglycemia    Low carb diet and exercise.  Follow met b and a1c.       Hypertension - Primary    Blood pressure on recheck improved.  Varying pressures.  Hold on additional medication.  Has amlodipine to take.  Follow pressures.  Lower extremity edema    Overall improved.  Reports increased with current medication.  Adjust dosing of patch.  Leg elevation.  Compression hose.  Stay hydrated.  Follow       Orthostatic hypotension due to Parkinson's disease [facility])    Not orthostatic now.  Follow pressures.       Relevant Medications   Rotigotine (NEUPRO) 1 MG/24HR PT24   Parkinson disease    Has worsened off medication.  Intolerance to ropinerole, sinemet and amantidine. Recently started on neupro 21m.  Helped, but felt worsening lower extremity swelling.  Will decrease dose of neupro to 135m  Follow.  Keep f/u with neurology.       Relevant Medications   Rotigotine (NEUPRO) 1 MG/24HR PT24     ChEinar PheasantMD

## 2022-04-13 DIAGNOSIS — G4752 REM sleep behavior disorder: Secondary | ICD-10-CM | POA: Diagnosis not present

## 2022-04-13 DIAGNOSIS — M545 Low back pain, unspecified: Secondary | ICD-10-CM | POA: Diagnosis not present

## 2022-04-13 DIAGNOSIS — R413 Other amnesia: Secondary | ICD-10-CM | POA: Diagnosis not present

## 2022-04-13 DIAGNOSIS — G20A2 Parkinson's disease without dyskinesia, with fluctuations: Secondary | ICD-10-CM | POA: Diagnosis not present

## 2022-04-13 DIAGNOSIS — G4700 Insomnia, unspecified: Secondary | ICD-10-CM | POA: Diagnosis not present

## 2022-04-13 DIAGNOSIS — G8929 Other chronic pain: Secondary | ICD-10-CM | POA: Diagnosis not present

## 2022-04-13 IMAGING — CT CT ANGIO CHEST
2 of 6 series · 18 of 46 positions shown · IV contrast (APPLIED)
Comparison: None.

CLINICAL DATA: Left leg pain and swelling

EXAM:
CT ANGIOGRAPHY CHEST WITH CONTRAST
TECHNIQUE: Multidetector CT imaging of the chest was performed using the
standard protocol during bolus administration of intravenous
contrast. Multiplanar CT image reconstructions and MIPs were
obtained to evaluate the vascular anatomy.
CONTRAST:  75mL OMNIPAQUE IOHEXOL 350 MG/ML SOLN

[Series 5: thins · axial · 0.63mm/px · z∈[-634,-422]mm · 15 of 235 slices shown]
[im 11/235  lung]
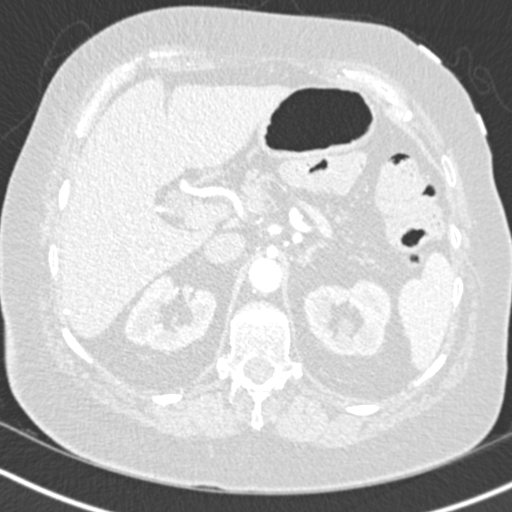
[im 31/235  soft-tissue]
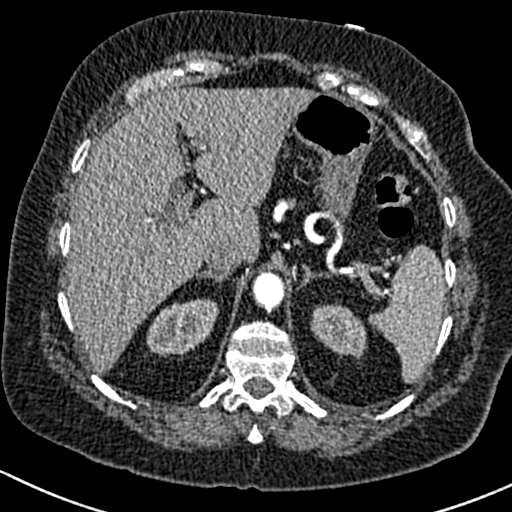
[im 41/235  lung]
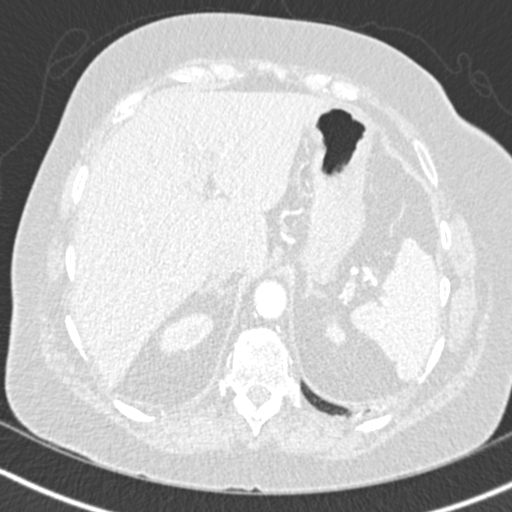
[im 62/235  soft-tissue]
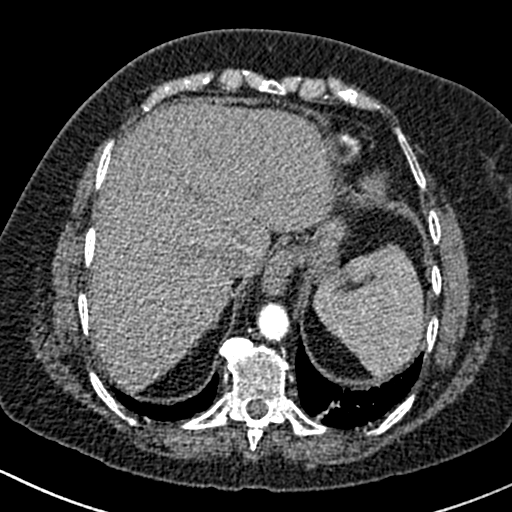
[im 72/235  lung]
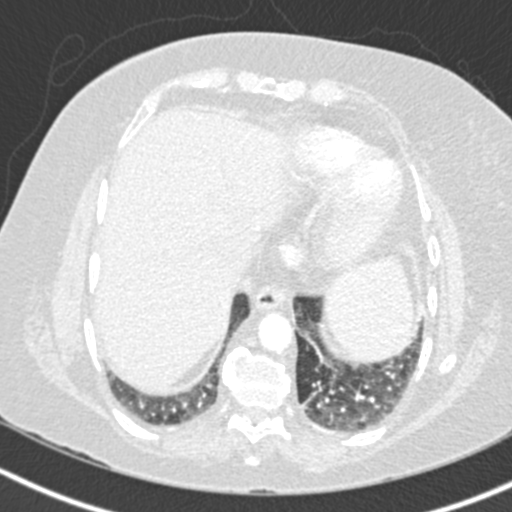
[im 92/235  soft-tissue]
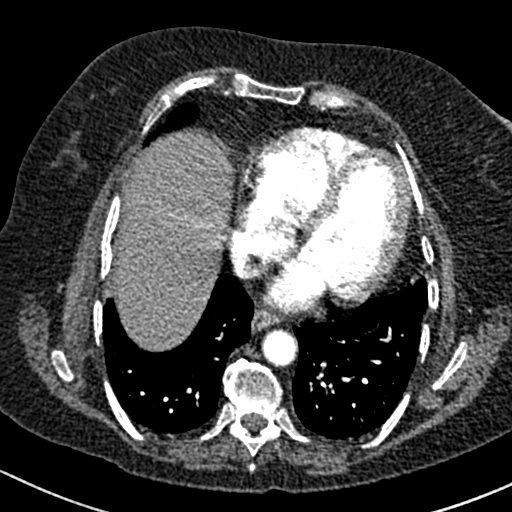
[im 102/235  lung]
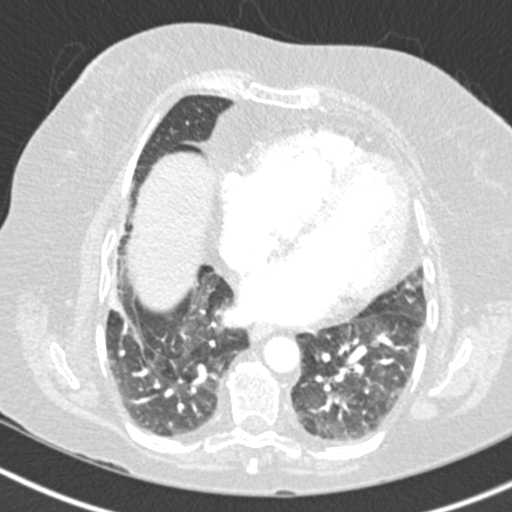
[im 123/235  soft-tissue]
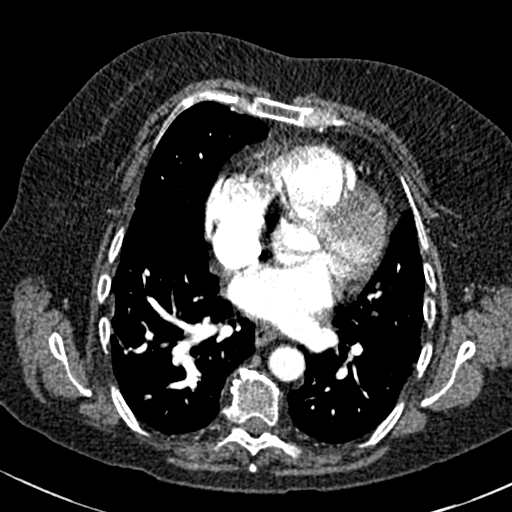
[im 133/235  lung]
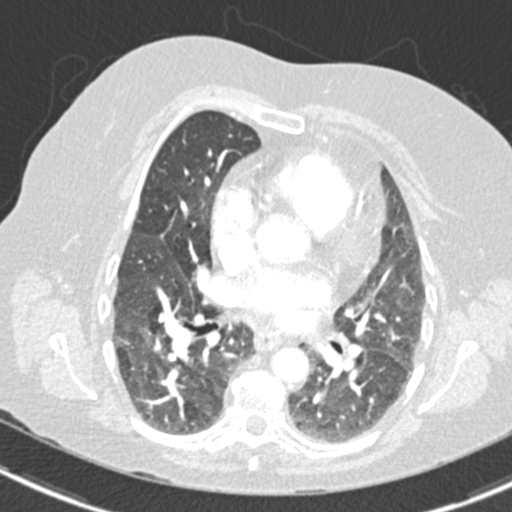
[im 143/235  soft-tissue]
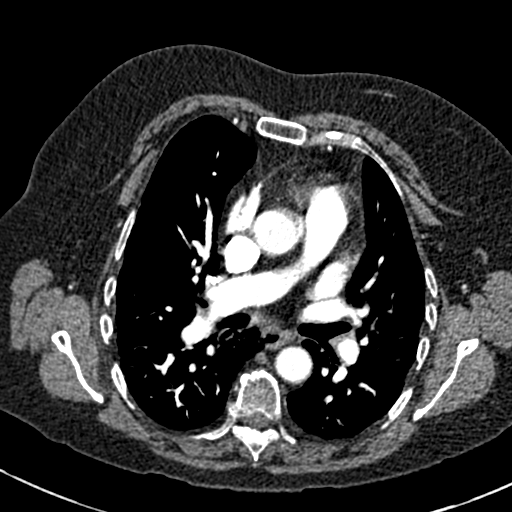
[im 163/235  lung]
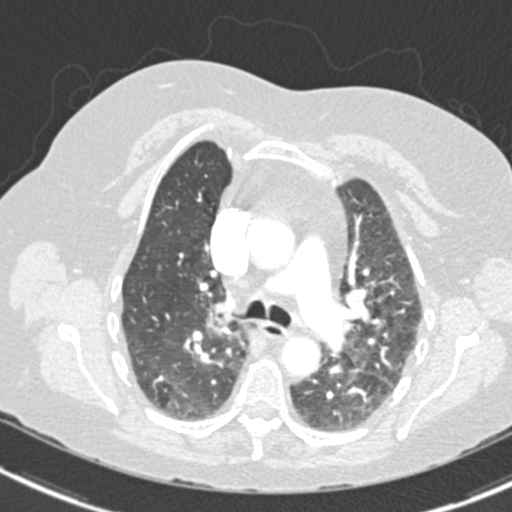
[im 173/235  soft-tissue]
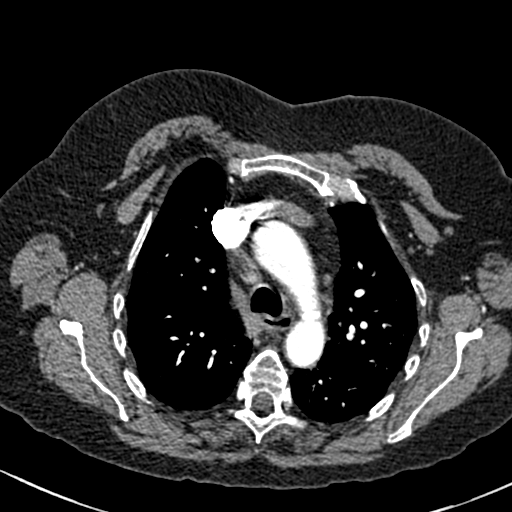
[im 194/235  lung]
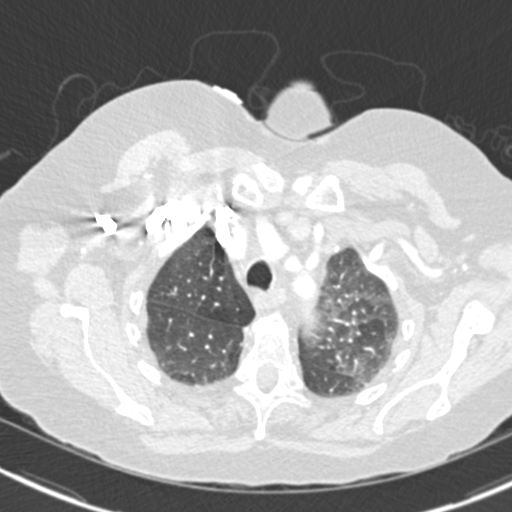
[im 204/235  soft-tissue]
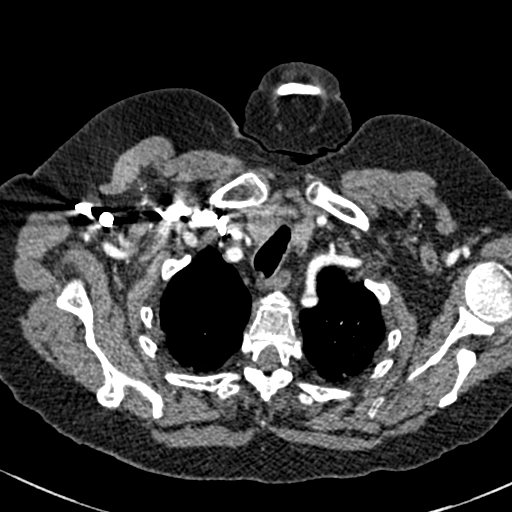
[im 224/235  lung]
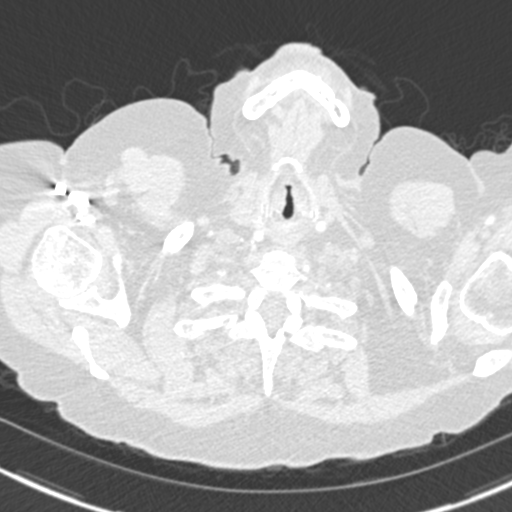

[Series 7: coronal mpr · coronal · 0.49mm/px · 3 of 93 slices shown]
[im 24/93  soft-tissue]
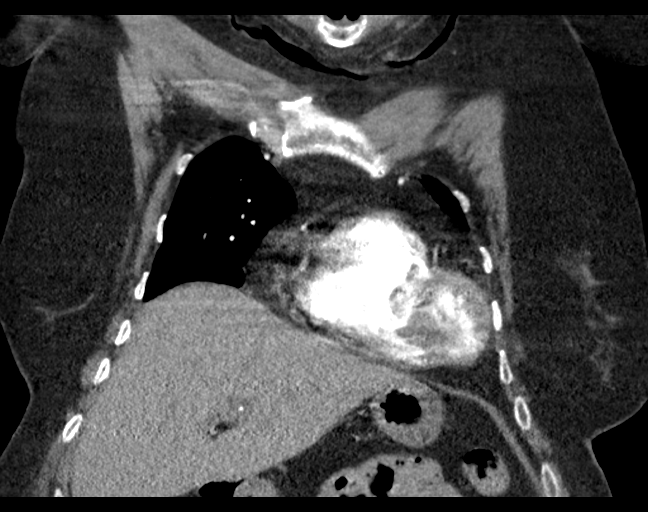
[im 47/93  soft-tissue]
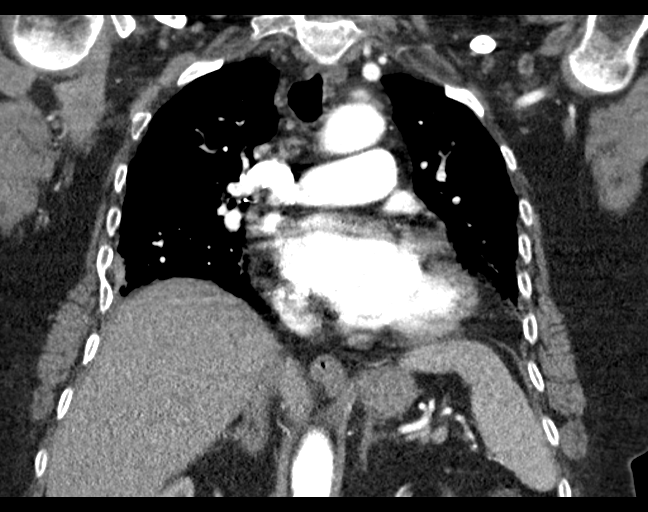
[im 70/93  soft-tissue]
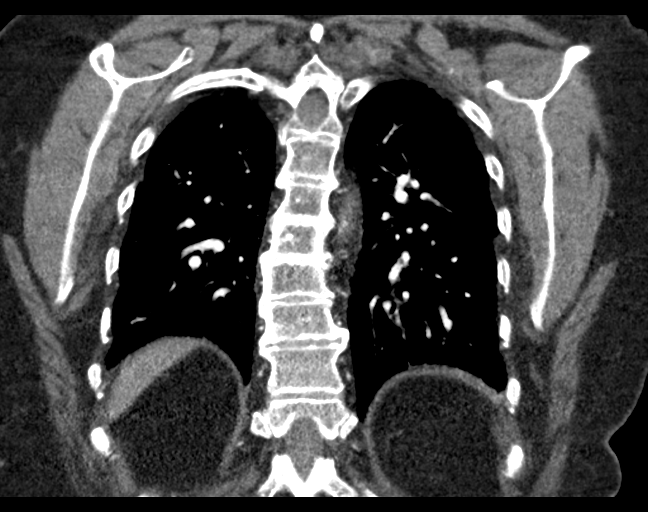

[18 of 46 positions shown; findings below may reference images not displayed]

FINDINGS: Cardiovascular: There is a optimal opacification of the pulmonary
arteries. There is no central,segmental, or subsegmental filling
defects within the pulmonary arteries. The heart is mildly enlarged.
No pericardial effusion or thickening. No evidence right heart
strain. There is normal three-vessel brachiocephalic anatomy without
proximal stenosis. Scattered aortic atherosclerosis is noted.

Mediastinum/Nodes: No hilar, mediastinal, or axillary adenopathy.
Thyroid gland, trachea, and esophagus demonstrate no significant
findings.

Lungs/Pleura: There appears to be mild interlobular septal
thickening and prominence of the central pulmonary vasculature.

Upper Abdomen: No acute abnormalities present in the visualized
portions of the upper abdomen.

Musculoskeletal: No chest wall abnormality. No acute or significant
osseous findings. Anterior flowing osteophytes are seen in the
midthoracic spine.

Review of the MIP images confirms the above findings.
IMPRESSION: No central, segmental, or subsegmental pulmonary embolism

Findings which may be suggestive of mild interstitial edema

Mild cardiomegaly

Aortic Atherosclerosis ([id number]-BPQ.Q).

## 2022-04-13 IMAGING — CR DG CHEST 2V
1 series · 2 of 2 positions shown · non-contrast
Comparison: 07/09/2020 chest radiograph.

CLINICAL DATA: Dyspnea, left lower extremity pain and swelling

EXAM:
CHEST - 2 VIEW

[Series 1: dg chest 2 view · 0.14mm/px · 2 of 2 slices shown]
[im 1/2]
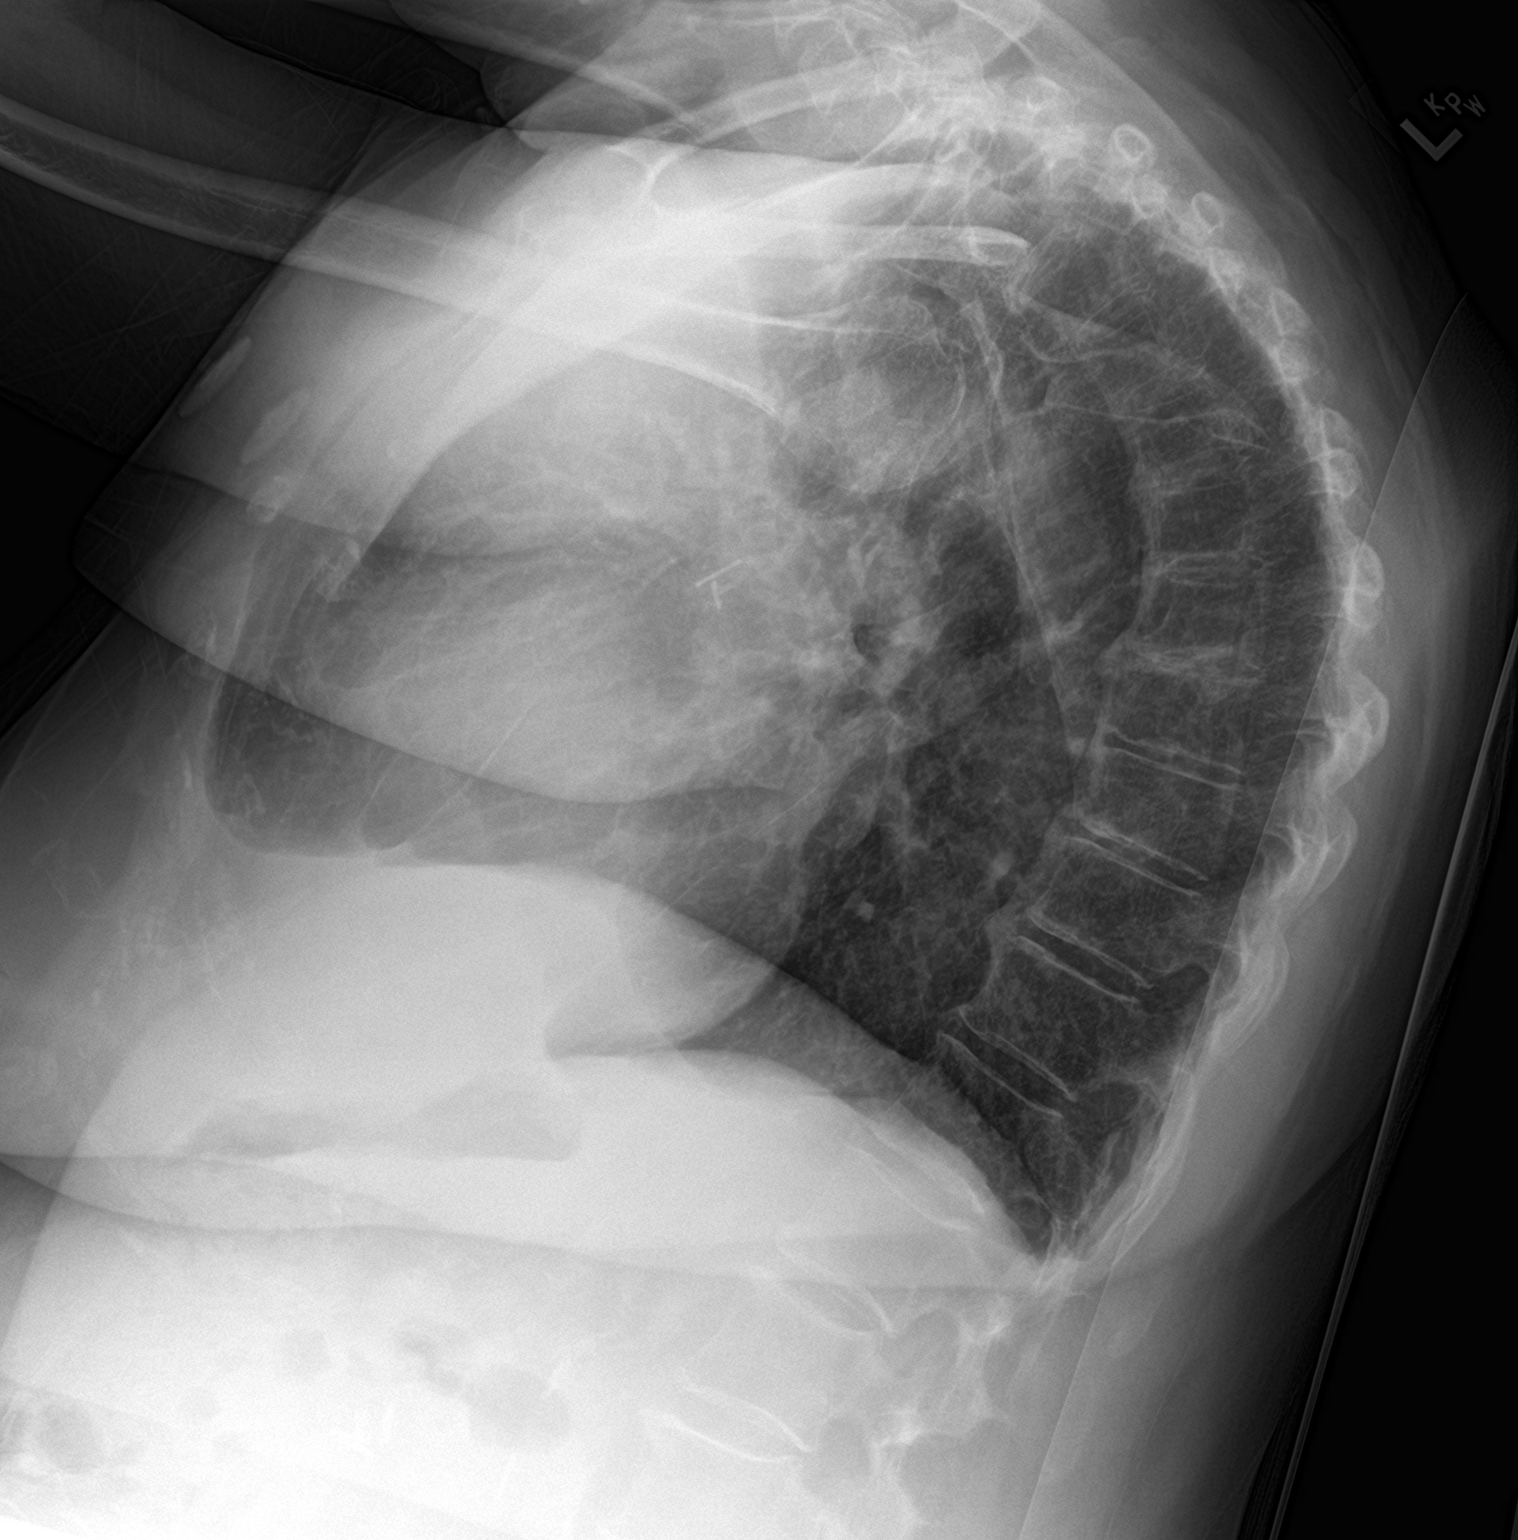
[im 2/2]
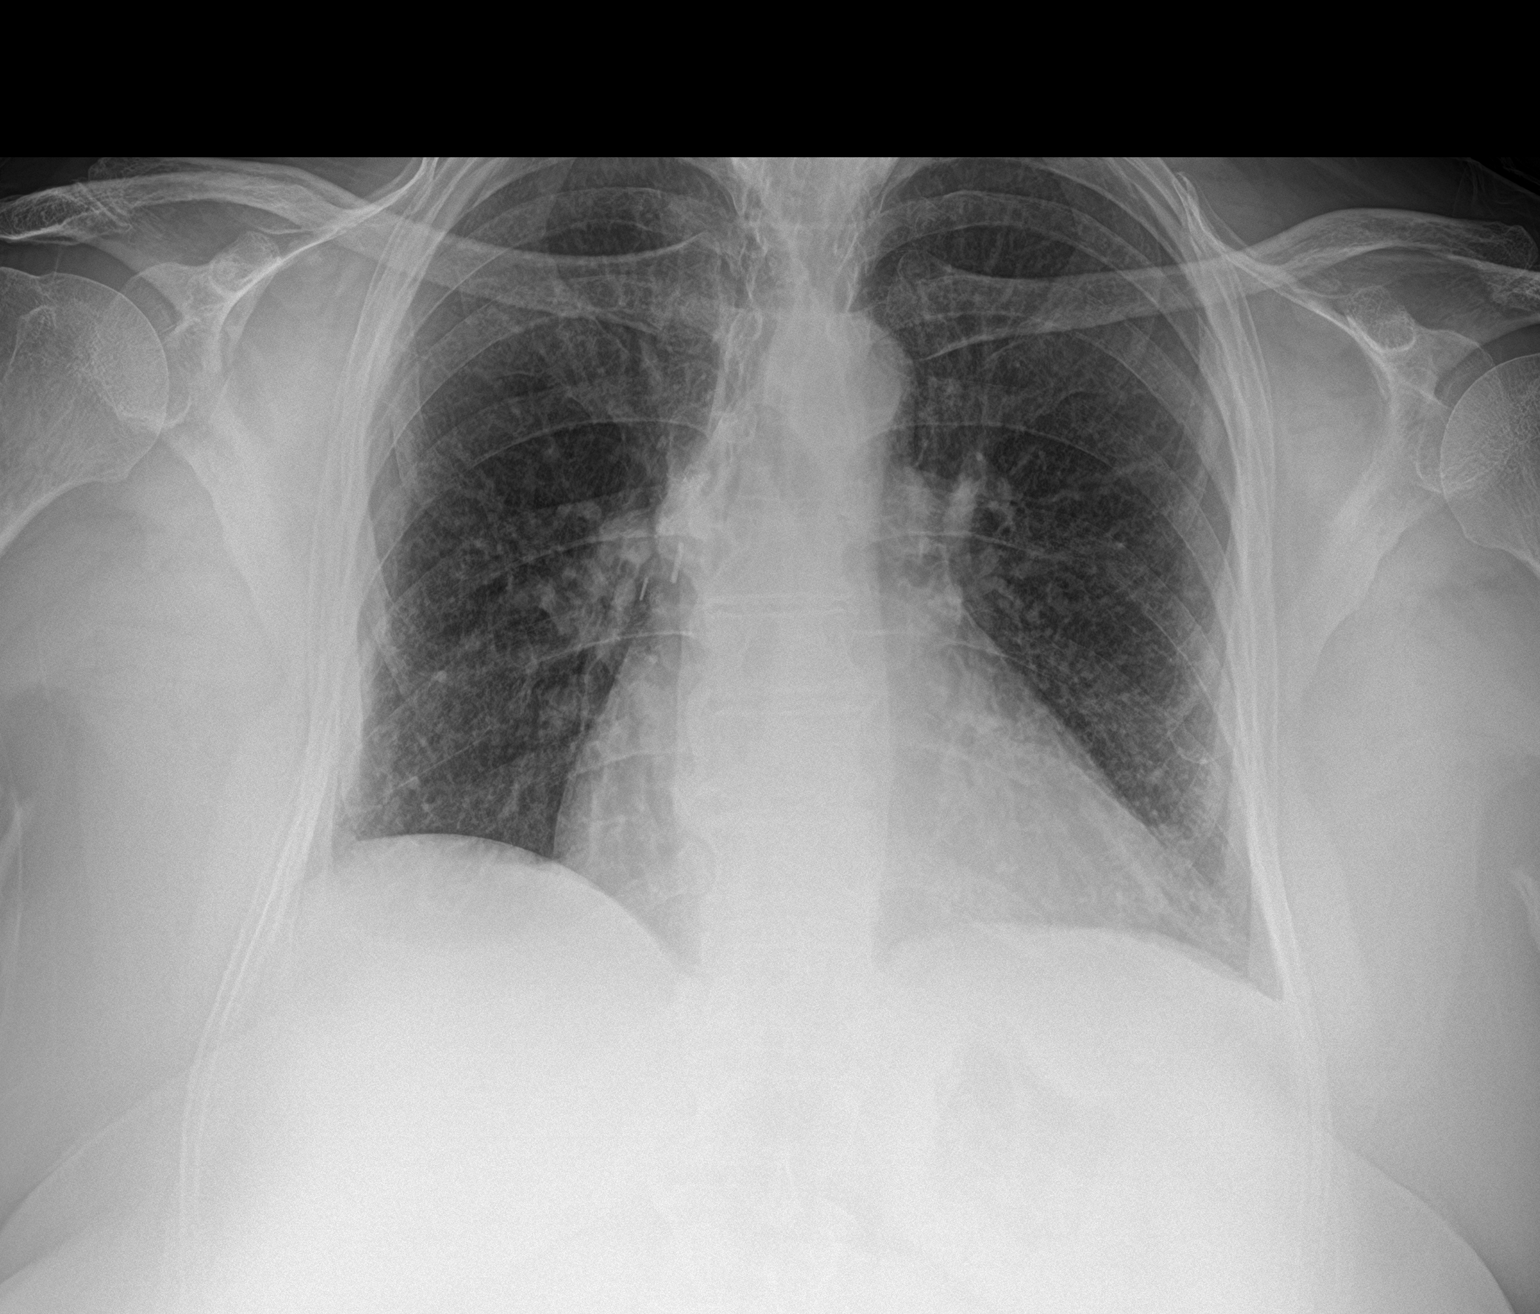

[2 of 2 positions shown; findings below may reference images not displayed]

FINDINGS: Stable cardiomediastinal silhouette with mild cardiomegaly. No
pneumothorax. No pleural effusion. Surgical clips overlie the right
hilum. No pulmonary edema. No acute consolidative airspace disease.
IMPRESSION: No active pulmonary disease. Mild cardiomegaly without pulmonary
edema.

## 2022-04-13 IMAGING — US US EXTREM LOW VENOUS*R*
1 series · 14 of 24 positions shown · non-contrast
Comparison: None.

CLINICAL DATA: Pain and swelling

EXAM:
RIGHT LOWER EXTREMITY VENOUS DOPPLER ULTRASOUND
TECHNIQUE: Gray-scale sonography with compression, as well as color and duplex
ultrasound, were performed to evaluate the deep venous system(s)
from the level of the common femoral vein through the popliteal and
proximal calf veins.

[Series 1: us venous img lower uni right (dvt) · portal-venous · 14 of 33 slices shown]
[im 1/33]
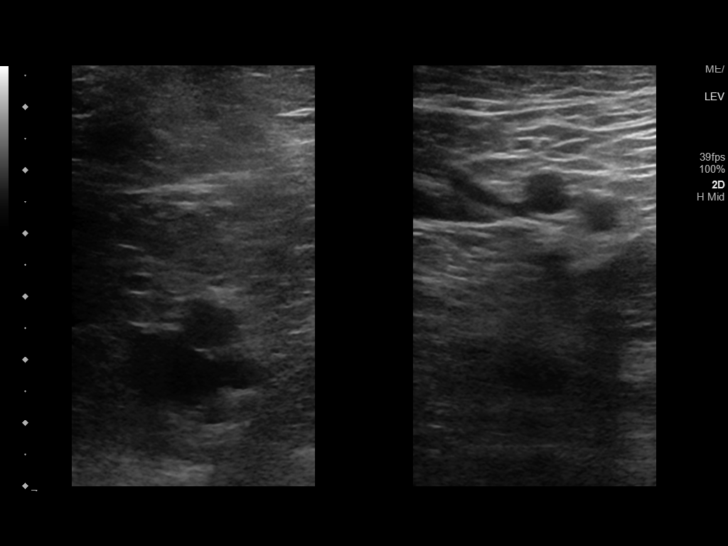
[im 3/33]
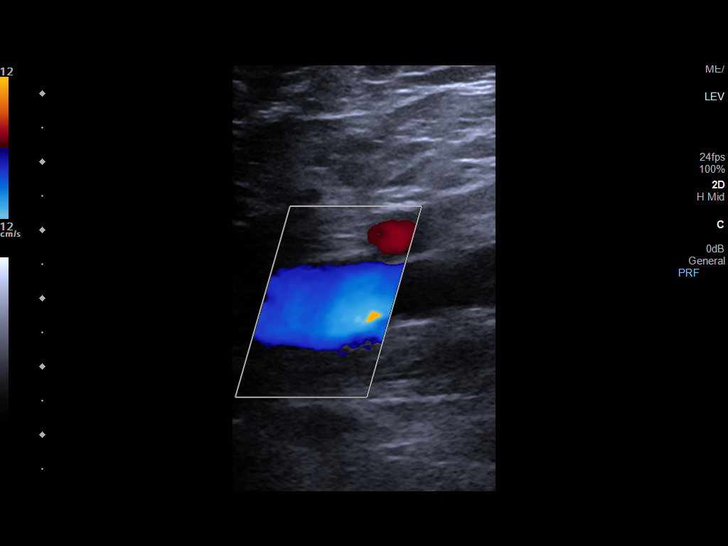
[im 6/33]
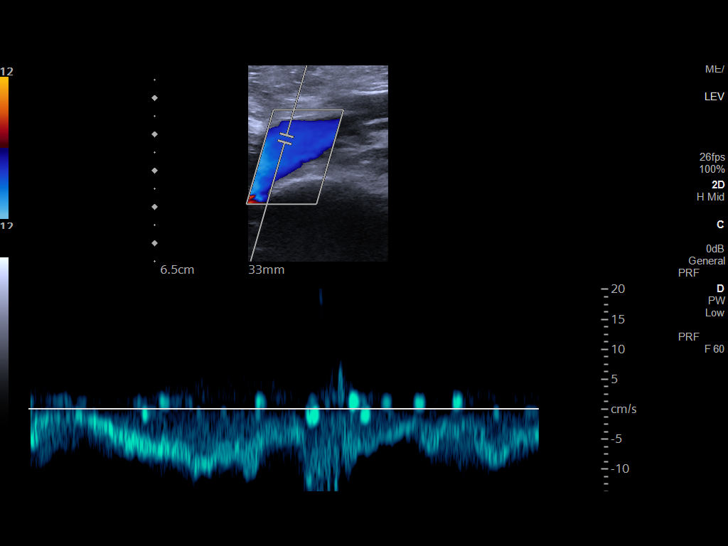
[im 9/33]
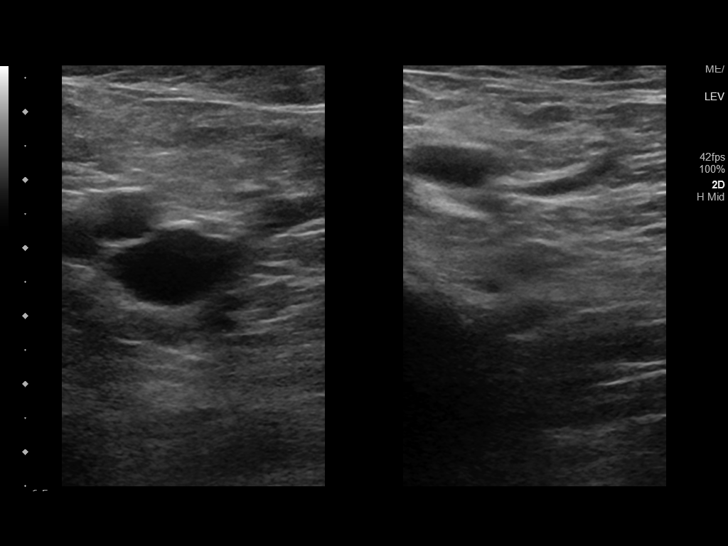
[im 10/33]
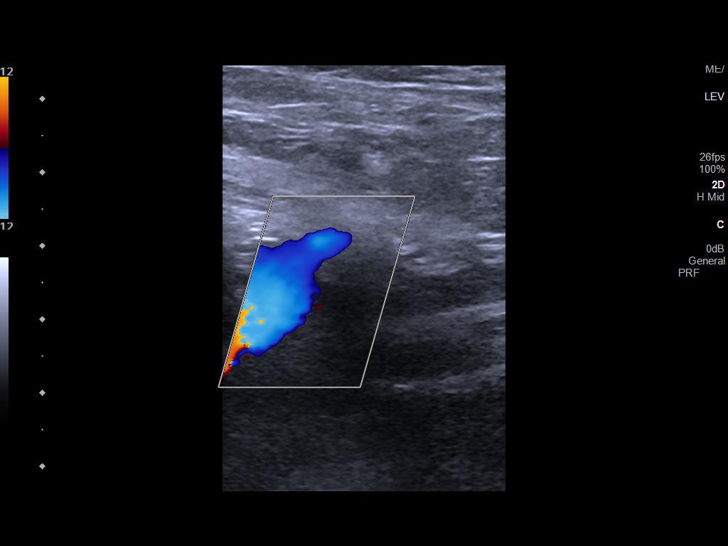
[im 13/33]
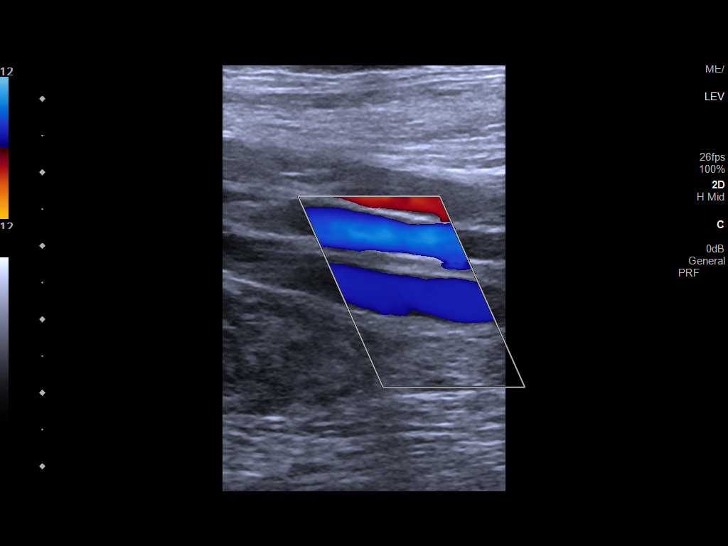
[im 16/33]
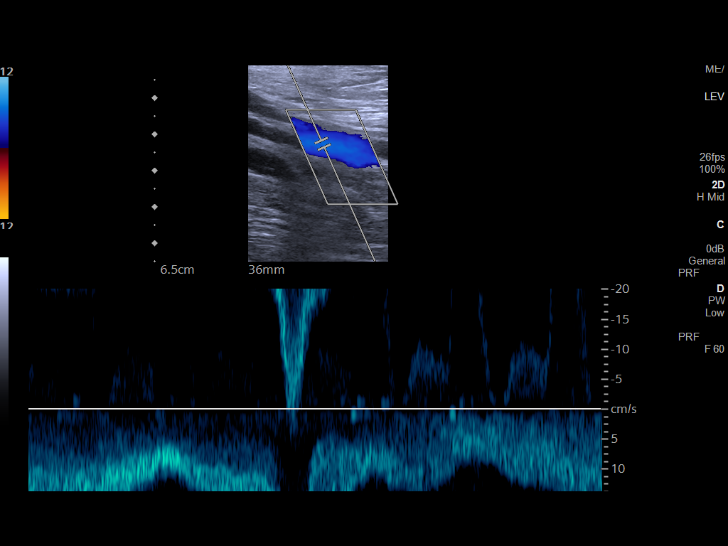
[im 17/33]
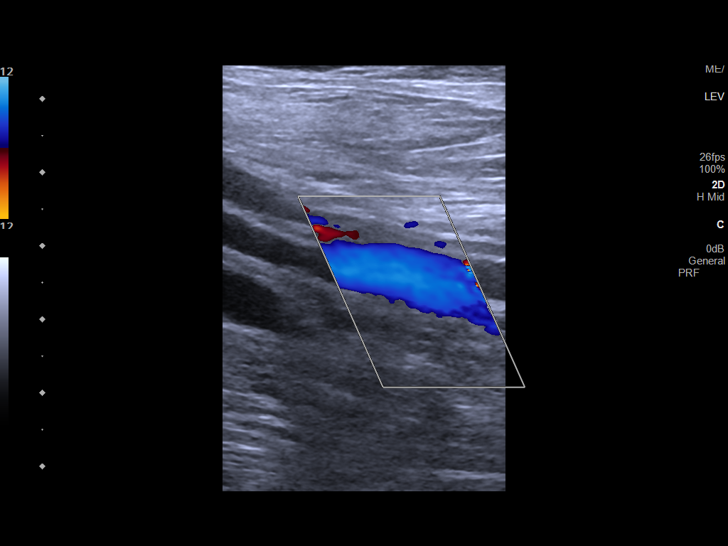
[im 20/33]
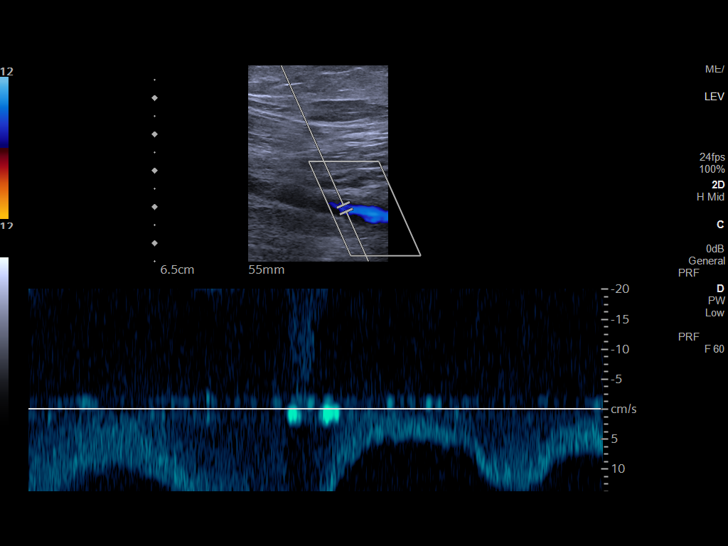
[im 23/33]
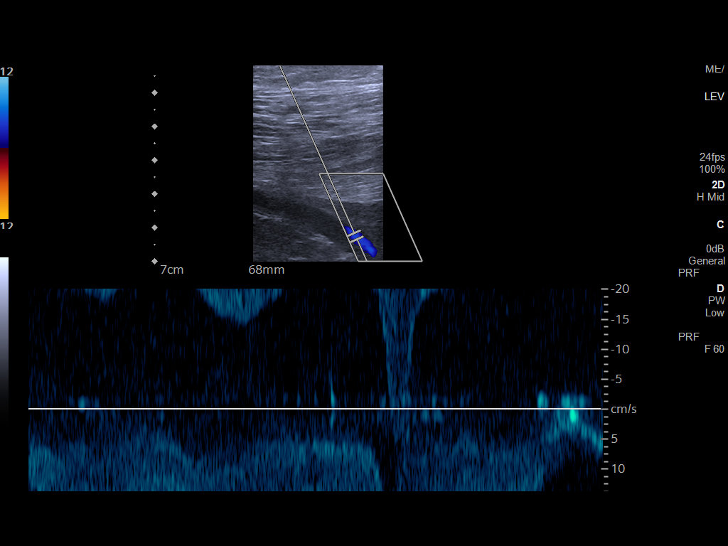
[im 26/33]
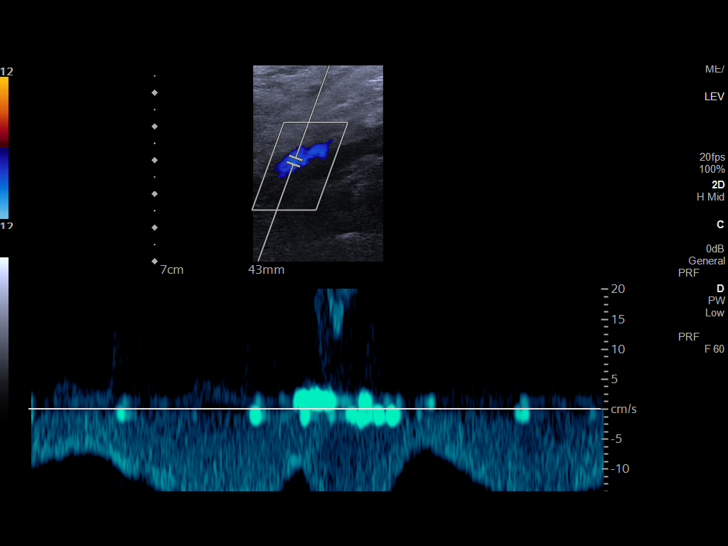
[im 27/33]
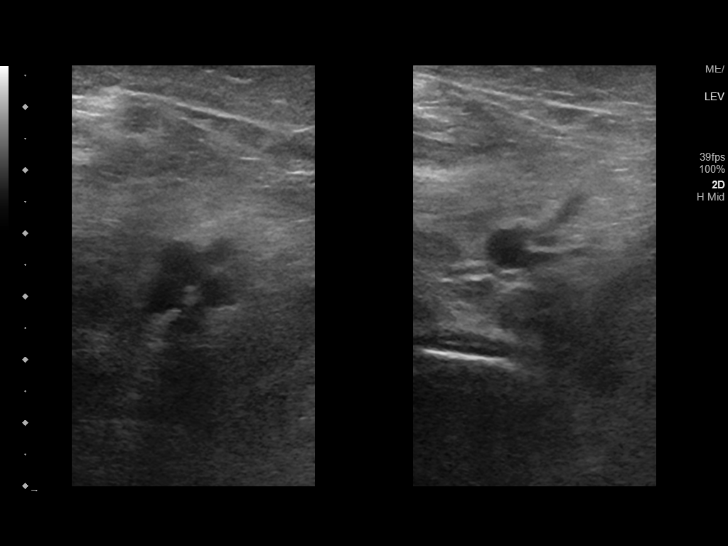
[im 30/33]
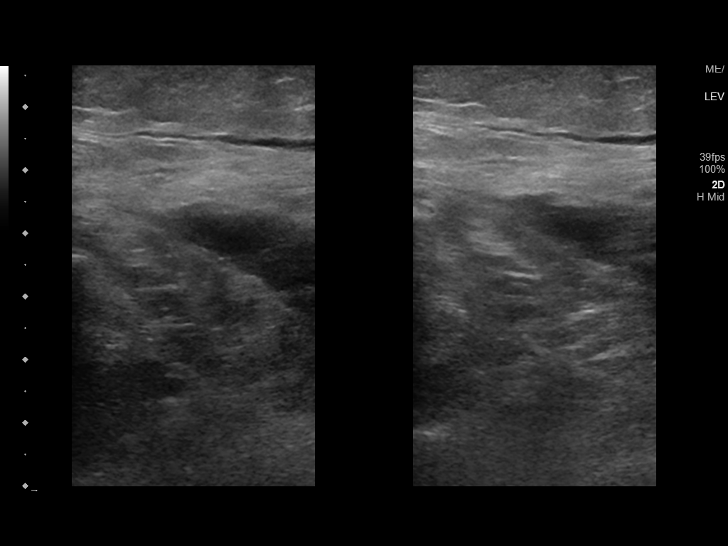
[im 33/33]
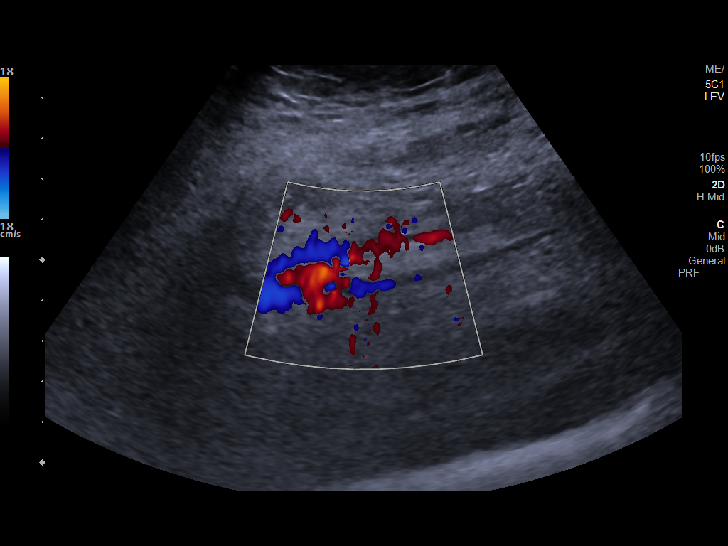

[14 of 24 positions shown; findings below may reference images not displayed]

FINDINGS: VENOUS

Normal compressibility of the common femoral, superficial femoral,
and popliteal veins, as well as the visualized calf veins.
Visualized portions of profunda femoral vein and great saphenous
vein unremarkable. No filling defects to suggest DVT on grayscale or
color Doppler imaging. Doppler waveforms show normal direction of
venous flow, normal respiratory plasticity and response to
augmentation.

Limited views of the contralateral common femoral vein are
unremarkable.

OTHER

None.

Limitations: none
IMPRESSION: Negative.

## 2022-04-22 ENCOUNTER — Encounter: Payer: Self-pay | Admitting: Internal Medicine

## 2022-04-22 NOTE — Assessment & Plan Note (Signed)
Crestor.  Follow lipid panel and liver function tests.

## 2022-04-22 NOTE — Assessment & Plan Note (Signed)
Blood pressure on recheck improved.  Varying pressures.  Hold on additional medication.  Has amlodipine to take.  Follow pressures.

## 2022-04-22 NOTE — Assessment & Plan Note (Signed)
Follow cbc.  

## 2022-04-22 NOTE — Assessment & Plan Note (Signed)
Has worsened off medication.  Intolerance to ropinerole, sinemet and amantidine. Recently started on neupro 2mg .  Helped, but felt worsening lower extremity swelling.  Will decrease dose of neupro to 1mg .  Follow.  Keep f/u with neurology.

## 2022-04-22 NOTE — Assessment & Plan Note (Signed)
Not orthostatic now.  Follow pressures.

## 2022-04-22 NOTE — Assessment & Plan Note (Signed)
Continue celexa.  Follow. Stable.

## 2022-04-22 NOTE — Assessment & Plan Note (Signed)
Upper symptoms appear to be controlled on prilosec.  Follow.

## 2022-04-22 NOTE — Assessment & Plan Note (Signed)
Continue citalopram.   Follow.

## 2022-04-22 NOTE — Assessment & Plan Note (Signed)
Crestor.  Low cholesterol diet and exercise. Follow lipid panel and liver function tests.

## 2022-04-22 NOTE — Assessment & Plan Note (Signed)
Low carb diet and exercise.  Follow met b and a1c.  

## 2022-04-22 NOTE — Assessment & Plan Note (Signed)
Overall improved.  Reports increased with current medication.  Adjust dosing of patch.  Leg elevation.  Compression hose.  Stay hydrated.  Follow

## 2022-04-22 NOTE — Assessment & Plan Note (Signed)
Avoid antiinflammatories.  Follow pressures.  Off micardis due to low blood pressure.  Follow. Follow metabolic panel.

## 2022-04-27 ENCOUNTER — Ambulatory Visit (INDEPENDENT_AMBULATORY_CARE_PROVIDER_SITE_OTHER): Payer: Medicare Other | Admitting: Internal Medicine

## 2022-04-27 ENCOUNTER — Encounter: Payer: Self-pay | Admitting: Internal Medicine

## 2022-04-27 VITALS — BP 136/66 | HR 75 | Temp 98.0°F | Ht 64.0 in | Wt 171.0 lb

## 2022-04-27 DIAGNOSIS — E7800 Pure hypercholesterolemia, unspecified: Secondary | ICD-10-CM | POA: Diagnosis not present

## 2022-04-27 DIAGNOSIS — I10 Essential (primary) hypertension: Secondary | ICD-10-CM

## 2022-04-27 DIAGNOSIS — N183 Chronic kidney disease, stage 3 unspecified: Secondary | ICD-10-CM | POA: Diagnosis not present

## 2022-04-27 DIAGNOSIS — I779 Disorder of arteries and arterioles, unspecified: Secondary | ICD-10-CM | POA: Diagnosis not present

## 2022-04-27 DIAGNOSIS — Z96652 Presence of left artificial knee joint: Secondary | ICD-10-CM

## 2022-04-27 DIAGNOSIS — Z8589 Personal history of malignant neoplasm of other organs and systems: Secondary | ICD-10-CM

## 2022-04-27 DIAGNOSIS — I700 Atherosclerosis of aorta: Secondary | ICD-10-CM

## 2022-04-27 DIAGNOSIS — F339 Major depressive disorder, recurrent, unspecified: Secondary | ICD-10-CM

## 2022-04-27 DIAGNOSIS — R739 Hyperglycemia, unspecified: Secondary | ICD-10-CM

## 2022-04-27 DIAGNOSIS — G903 Multi-system degeneration of the autonomic nervous system: Secondary | ICD-10-CM | POA: Diagnosis not present

## 2022-04-27 DIAGNOSIS — R600 Localized edema: Secondary | ICD-10-CM

## 2022-04-27 DIAGNOSIS — G20A1 Parkinson's disease without dyskinesia, without mention of fluctuations: Secondary | ICD-10-CM | POA: Diagnosis not present

## 2022-04-27 DIAGNOSIS — R2681 Unsteadiness on feet: Secondary | ICD-10-CM

## 2022-04-27 NOTE — Progress Notes (Unsigned)
Patient ID: Victoria Hanson, female   DOB: 1951/05/18, 71 y.o.   MRN: [id number]   Subjective:    Patient ID: Victoria Hanson, female    DOB: 1950-08-16, 71 y.o.   MRN: [id number]   Patient here for No chief complaint on file.  Marland Kitchen   HPI Here to follow up regarding her blood pressure, lower extremity swelling and parkinsons. On neupro 1mg /24 patch now.  Had lower extremity swelling with 2mg  patch.  Had hypotension with ropinirole.  Also, chronic low back pain.  Recommended and referred to PT.     Past Medical History:  Diagnosis Date   Anxiety    Asthma    Atypical chest pain    a. 2003 Cath: reportedly nl per pt [name]);  b. 04/2015 [facility]mission, neg troponin.   Colon cancer ([place]) 1999   a. 1999: Pt had partial colectomy. Did develop lung metastasis. Had right upper lobectomy in 01/2007 then had associated chemotherapy.   COPD (chronic obstructive pulmonary disease) (HCC)    DDD (degenerative disc disease), lumbosacral    Depression    Echocardiogram abnormal    a. 02/2010 Echo: EF 55-60%, mild LAE, no regional wall motion abnormalities   Elevated transaminase level    a. ? NASH;  b. 06/2015 - nl LFTs.   GERD (gastroesophageal reflux disease)    Hematuria    Holter monitor, abnormal    a. 12/2009 Holter monitoring: NSR, rare PACs and PVCs.   Hypercholesterolemia    Hyperlipidemia    Hypertension    Leaky heart valve    a. Per pt report - no evidence of valvular abnormalities on prior echoes.   Lesion of skin of Right breast 10/20/2017   Lung metastases 2008   a. S/P resection (lobectomy - right)   Morbid obesity ([place])    Nephrolithiasis    Parkinson disease 08/2016   [name]   Past Surgical History:  Procedure Laterality Date   BREAST EXCISIONAL BIOPSY Right 10/07/2009   benign   CARDIAC CATHETERIZATION  2003   negative as per pt report   CHOLECYSTECTOMY  2000   COLON SURGERY     Colon cancer, removed part of large colon   COLONOSCOPY N/A 04/25/2015   Procedure:  COLONOSCOPY;  Surgeon: Manya Silvas, MD;  Location: [place];  Service: Endoscopy;  Laterality: N/A;   COLONOSCOPY WITH PROPOFOL N/A 06/14/2017   Procedure: COLONOSCOPY WITH PROPOFOL;  Surgeon: Manya Silvas, MD;  Location: [facility] ENDOSCOPY;  Service: Endoscopy;  Laterality: N/A;   COLONOSCOPY WITH PROPOFOL N/A 11/17/2019   Procedure: COLONOSCOPY WITH PROPOFOL;  Surgeon: [name], MD;  Location: [facility] ENDOSCOPY;  Service: Gastroenterology;  Laterality: N/A;   COLONOSCOPY WITH PROPOFOL N/A 03/21/2021   Procedure: COLONOSCOPY WITH PROPOFOL;  Surgeon: Lesly Rubenstein, MD;  Location: [facility] ENDOSCOPY;  Service: Endoscopy;  Laterality: N/A;   ESOPHAGOGASTRODUODENOSCOPY (EGD) WITH PROPOFOL N/A 03/21/2021   Procedure: ESOPHAGOGASTRODUODENOSCOPY (EGD) WITH PROPOFOL;  Surgeon: Lesly Rubenstein, MD;  Location: [facility] ENDOSCOPY;  Service: Endoscopy;  Laterality: N/A;   LOBECTOMY  01/2007   Right lung, upper lobe right side removed   TOTAL ABDOMINAL HYSTERECTOMY     abnormal bleeding   TUBAL LIGATION  1980   Family History  Problem Relation Age of Onset   Coronary artery disease Mother        died @ 52 of MI.   Mental illness Mother    Diabetes Mother    Heart attack Father 30  MI   Cancer Father        lung - died in his mid-70's   Hypertension Father    Diabetes Father    Sudden death Brother    Mental illness Brother    Heart attack Brother        MI @ 24, died @ age 14.   Breast cancer Neg Hx    Social History   Socioeconomic History   Marital status: Widowed    Spouse name: Not on file   Number of children: Not on file   Years of education: Not on file   Highest education level: Not on file  Occupational History   Not on file  Tobacco Use   Smoking status: Never    Passive exposure: Yes   Smokeless tobacco: Never   Tobacco comments:    husband and son smoked in her home.  Vaping Use   Vaping Use: Never used  Substance and Sexual Activity   Alcohol use:  Not Currently    Alcohol/week: 0.0 standard drinks of alcohol   Drug use: No   Sexual activity: Never  Other Topics Concern   Not on file  Social History Narrative   Widowed.   Disabled (colon/lung CA), retired.   Lives in [place] with her dtr, grand-dtr, and son.   Active.   Gets regular exercise, walks.   Social Determinants of Health   Financial Resource Strain: Low Risk  (11/29/2021)   Overall Financial Resource Strain (CARDIA)    Difficulty of Paying Living Expenses: Not hard at all  Food Insecurity: No Food Insecurity (11/29/2021)   Hunger Vital Sign    Worried About Running Out of Food in the Last Year: Never true    Ran Out of Food in the Last Year: Never true  Transportation Needs: No Transportation Needs (11/29/2021)   PRAPARE - Hydrologist (Medical): No    Lack of Transportation (Non-Medical): No  Physical Activity: Not on file  Stress: No Stress Concern Present (11/29/2021)   [place]    Feeling of Stress : Only a little  Social Connections: Unknown (11/29/2021)   Social Connection and Isolation Panel [NHANES]    Frequency of Communication with Friends and Family: More than three times a week    Frequency of Social Gatherings with Friends and Family: More than three times a week    Attends Religious Services: Not on file    Active Member of Clubs or Organizations: Not on file    Attends Archivist Meetings: Not on file    Marital Status: Not on file     Review of Systems     Objective:     There were no vitals taken for this visit. Wt Readings from Last 3 Encounters:  04/11/22 173 lb 6.4 oz (78.7 kg)  03/30/22 174 lb 3.2 oz (79 kg)  03/09/22 177 lb 4.8 oz (80.4 kg)    Physical Exam   Outpatient Encounter Medications as of 04/27/2022  Medication Sig   ALPRAZolam (XANAX) 0.5 MG tablet TAKE 1/2 TABLET BY MOUTH ONCE DAILY AS NEEDED    amLODipine (NORVASC) 5 MG tablet Take 1.5 tablets (7.5 mg total) by mouth daily. (5 mg + 2.5 mg qd)   aspirin EC 81 MG tablet Take 81 mg by mouth as needed.    calcium-vitamin D (OSCAL WITH D) 500-200 MG-UNIT tablet Take 1 tablet by mouth.   citalopram (CELEXA)  10 MG tablet TAKE ONE AND ONE-HALF TABLET BY MOUTH DAILY   citalopram (CELEXA) 20 MG tablet Take 1 tablet (20 mg total) by mouth daily.   fluticasone (FLONASE) 50 MCG/ACT nasal spray Place 2 sprays into both nostrils daily.   fluticasone-salmeterol (ADVAIR HFA) 115-21 MCG/ACT inhaler Inhale 2 puffs into the lungs 2 (two) times daily.   furosemide (LASIX) 20 MG tablet Take 1 tablet (20 mg total) by mouth daily as needed for edema.   montelukast (SINGULAIR) 10 MG tablet TAKE 1 TABLET BY MOUTH AT BEDTIME   PROAIR HFA 108 (90 Base) MCG/ACT inhaler INHALE 2 PUFFS BY MOUTH INTO THE LUNGS EVERY 6 HOURS AS NEEDED FOR WHEEZING   rosuvastatin (CRESTOR) 5 MG tablet TAKE 1 TABLET BY MOUTH EVERY MONDAY, WEDNESDAY AND FRIDAY   Rotigotine (NEUPRO) 1 MG/24HR PT24 Place 1 patch (1 mg total) onto the skin daily.   traZODone (DESYREL) 50 MG tablet TAKE 2 TABLETS BY MOUTH AT BEDTIME   vitamin B-12 (CYANOCOBALAMIN) 100 MCG tablet Take 100 mcg by mouth daily.   No facility-administered encounter medications on file as of 04/27/2022.     Lab Results  Component Value Date   WBC 10.9 (H) 03/09/2022   HGB 12.6 03/09/2022   HCT 38.6 03/09/2022   PLT 295.0 03/09/2022   GLUCOSE 96 03/09/2022   CHOL 207 (H) 03/09/2022   TRIG 77.0 03/09/2022   HDL 72.60 03/09/2022   LDLDIRECT 163.4 12/31/2012   LDLCALC 119 (H) 03/09/2022   ALT 6 03/09/2022   AST 10 03/09/2022   NA 141 03/09/2022   K 4.1 03/09/2022   CL 105 03/09/2022   CREATININE 1.08 03/09/2022   BUN 25 (H) 03/09/2022   CO2 26 03/09/2022   TSH 1.67 03/09/2022   INR 1.0 12/12/2012   HGBA1C 5.9 03/09/2022    US Venous Img Lower Unilateral Left  Result Date: 01/30/2022 CLINICAL DATA:  Pain and  swelling EXAM: Left LOWER EXTREMITY VENOUS DOPPLER ULTRASOUND TECHNIQUE: Gray-scale sonography with compression, as well as color and duplex ultrasound, were performed to evaluate the deep venous system(s) from the level of the common femoral vein through the popliteal and proximal calf veins. COMPARISON:  None Available. FINDINGS: VENOUS Normal compressibility of the common femoral, superficial femoral, and popliteal veins, as well as the visualized calf veins. Visualized portions of profunda femoral vein and great saphenous vein unremarkable. No filling defects to suggest DVT on grayscale or color Doppler imaging. Doppler waveforms show normal direction of venous flow, normal respiratory plasticity and response to augmentation. Limited views of the contralateral common femoral vein are unremarkable. OTHER None. Limitations: none IMPRESSION: There is no evidence of deep venous thrombosis in left lower extremity. Electronically Signed   By: Elmer Picker M.D.   On: 01/30/2022 15:34       Assessment & Plan:   Problem List Items Addressed This Visit   None    Einar Pheasant, MD

## 2022-04-29 ENCOUNTER — Encounter: Payer: Self-pay | Admitting: Internal Medicine

## 2022-04-29 NOTE — Assessment & Plan Note (Signed)
Doe not take blood pressure medication regularly.  Following pressures.

## 2022-04-29 NOTE — Assessment & Plan Note (Signed)
Evaluated by [facility].  Continue crestor.

## 2022-04-29 NOTE — Assessment & Plan Note (Signed)
Varying pressures.  Hold on additional medication.  Has amlodipine to take.  Follow pressures.

## 2022-04-29 NOTE — Assessment & Plan Note (Signed)
Low carb diet and exercise.  Follow met b and a1c.  

## 2022-04-29 NOTE — Assessment & Plan Note (Signed)
-   Crestor 

## 2022-04-29 NOTE — Assessment & Plan Note (Signed)
Increased swelling as outlined.  She relates to increased dose of neupro.  The 1mg  patch did not work.  Back on 2mg  patch now.  Discussed compression hose.  Did help when she was wearing them.  Start back wearing compression hose daily - on in am and off at night.  Discussed importance of leg elevation.  Avoid increased sodium intake.  Follow.  Discussed referral to vascular for evaluation - question of need for UNNA wraps, lymphedema pump, etc.  Wants to hold on referral.

## 2022-04-29 NOTE — Assessment & Plan Note (Signed)
Crestor.  Low cholesterol diet and exercise. Follow lipid panel and liver function tests.

## 2022-04-29 NOTE — Assessment & Plan Note (Signed)
Avoid antiinflammatories.  Follow pressures.  Off micardis due to low blood pressure.  Follow. Follow metabolic panel.

## 2022-04-29 NOTE — Assessment & Plan Note (Signed)
S/p left TKA.  Followed by [facility]. Persistent increased pain.  Has f/u next week. Plans to discuss.

## 2022-04-29 NOTE — Assessment & Plan Note (Signed)
Continue citalopram.   Follow.

## 2022-04-29 NOTE — Assessment & Plan Note (Signed)
Discussed need for therapy.  Offered to arrange home health PT.  She declines.  Follow.  Notify me if changes her mind.

## 2022-04-29 NOTE — Assessment & Plan Note (Signed)
Worsens off medication.  Intolerance to ropinerole, sinemet and amantidine. Recently started on neupro 2mg .  Helped, but felt worsening lower extremity swelling.  Dose decreased to neupro to 1mg .  Did not work.  Back on 2mg  as outlined.  With increased swelling.  Discussed compression hose and further treatment.  Will try to keep her on 2mg  for now. Follow.  Keep f/u with neurology.

## 2022-04-29 NOTE — Assessment & Plan Note (Signed)
S/p lobectomy.  Followed by pulmonary.  Breathing stable.

## 2022-04-30 DIAGNOSIS — M1711 Unilateral primary osteoarthritis, right knee: Secondary | ICD-10-CM | POA: Diagnosis not present

## 2022-04-30 DIAGNOSIS — Z96652 Presence of left artificial knee joint: Secondary | ICD-10-CM | POA: Diagnosis not present

## 2022-05-01 DIAGNOSIS — Z96652 Presence of left artificial knee joint: Secondary | ICD-10-CM | POA: Diagnosis not present

## 2022-05-01 DIAGNOSIS — M1711 Unilateral primary osteoarthritis, right knee: Secondary | ICD-10-CM | POA: Diagnosis not present

## 2022-05-02 ENCOUNTER — Encounter: Payer: Self-pay | Admitting: Internal Medicine

## 2022-05-02 DIAGNOSIS — M25562 Pain in left knee: Secondary | ICD-10-CM | POA: Insufficient documentation

## 2022-05-04 ENCOUNTER — Other Ambulatory Visit: Payer: Self-pay | Admitting: Internal Medicine

## 2022-05-07 NOTE — Telephone Encounter (Signed)
Rx sent in for citalopram.

## 2022-05-08 ENCOUNTER — Telehealth: Payer: Self-pay | Admitting: Internal Medicine

## 2022-05-08 NOTE — Telephone Encounter (Signed)
Pt daughter called wanting to get another neurologist for the pt. Pt daughter would like to be called [facility] [phone] Fax [phone]

## 2022-05-08 NOTE — Telephone Encounter (Signed)
DISREGARD BELOW

## 2022-05-08 NOTE — Telephone Encounter (Signed)
Ok.  Dx parkinsons.

## 2022-05-09 ENCOUNTER — Telehealth: Payer: Self-pay

## 2022-05-09 ENCOUNTER — Other Ambulatory Visit: Payer: Self-pay

## 2022-05-09 ENCOUNTER — Telehealth: Payer: Self-pay | Admitting: Internal Medicine

## 2022-05-09 DIAGNOSIS — G20A1 Parkinson's disease without dyskinesia, without mention of fluctuations: Secondary | ICD-10-CM

## 2022-05-09 NOTE — Telephone Encounter (Signed)
New referral sent to [facility] [place]

## 2022-05-09 NOTE — Telephone Encounter (Signed)
Referral sent 

## 2022-05-09 NOTE — Telephone Encounter (Signed)
Patient's daughter, Arnetha Gula, called to state the neurologist we referred her to does not accept her insurance.  [name] states patient would like to be referred to [facility] in [place].

## 2022-05-14 ENCOUNTER — Telehealth: Payer: Self-pay

## 2022-05-14 NOTE — Telephone Encounter (Signed)
Attempted to call [name], no vm Referral for alternate Neuro was placed, is in process per referral notes

## 2022-05-14 NOTE — Telephone Encounter (Signed)
Victoria Hanson called office, note was read.

## 2022-05-14 NOTE — Telephone Encounter (Signed)
Patient's daughter, Victoria Hanson, called to state patient has not heard from anyone regarding scheduling an appointment with neurology.  I read message to Victoria Hanson and Victoria Hanson states patient has seen Dr. Jennings Books in the past but would like to have a new neurologist.  Victoria Hanson states she would like for [place] to please call her.

## 2022-05-15 ENCOUNTER — Encounter: Payer: Self-pay | Admitting: Neurology

## 2022-05-15 IMAGING — CT CT HEAD W/O CM
4 series · 17 of 47 positions shown, 19 images · non-contrast
Comparison: 11/02/2019

CLINICAL DATA: Vertigo and headaches

EXAM:
CT HEAD WITHOUT CONTRAST
TECHNIQUE: Contiguous axial images were obtained from the base of the skull
through the vertex without intravenous contrast.

[Series 2: head bone · axial · 0.39mm/px · z∈[-175,-125]mm · 4 of 73 slices shown]
[im 8/73  bone]
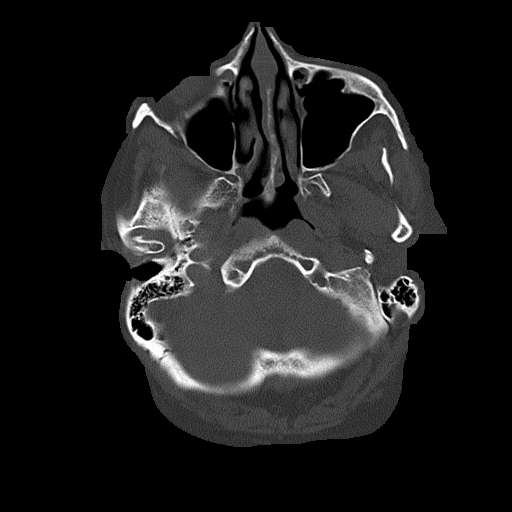
[im 15/73  bone]
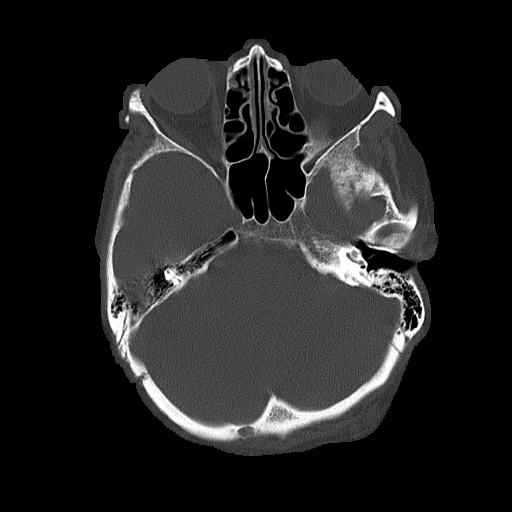
[im 22/73  bone]
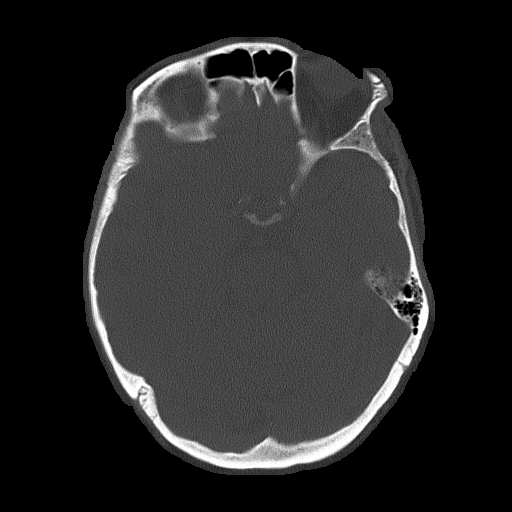
[im 33/73  bone]
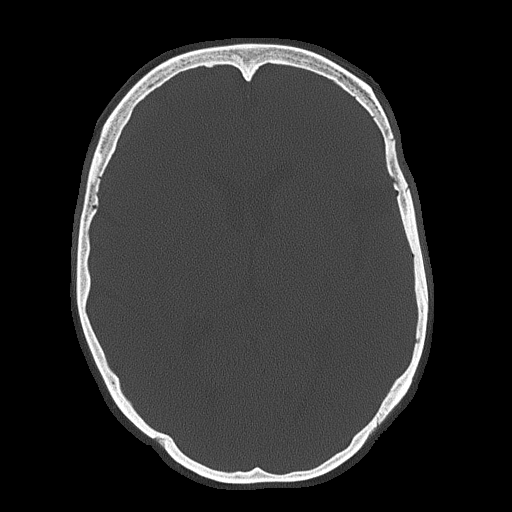

[Series 3: head wo · axial · 0.39mm/px · z∈[-174,-64]mm · 7 of 30 slices shown, 9 images]
[im 4/30  brain]
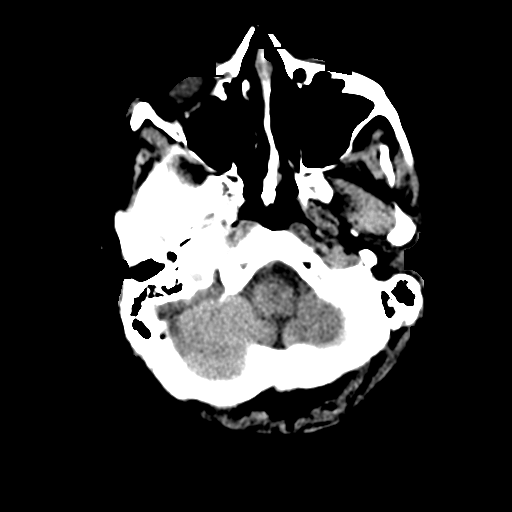
[im 4/30  bone]
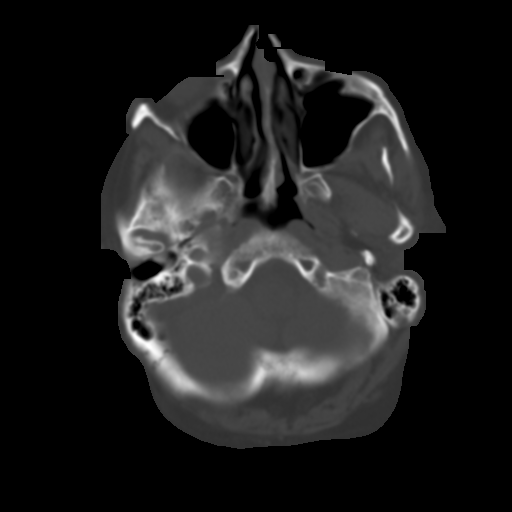
[im 8/30  brain]
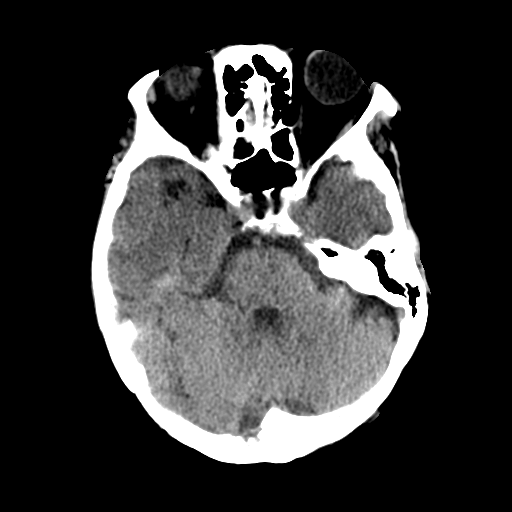
[im 11/30  brain]
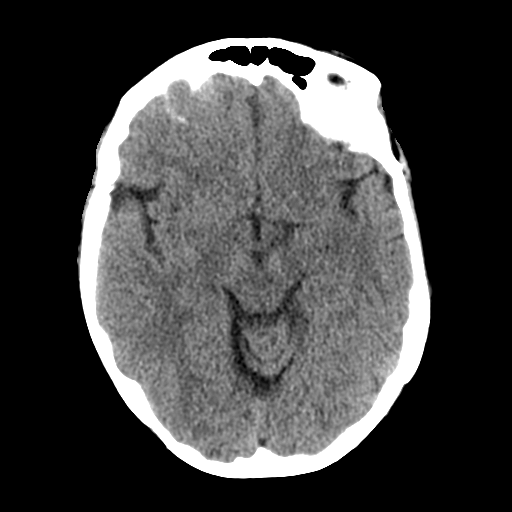
[im 15/30  brain]
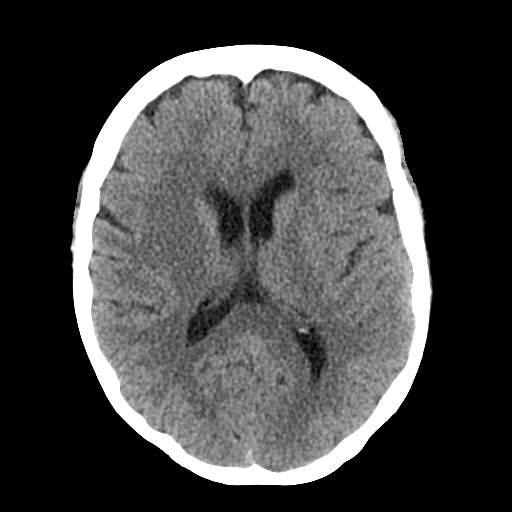
[im 19/30  brain]
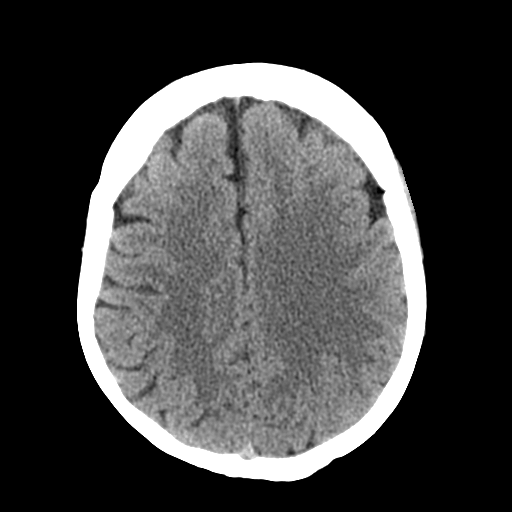
[im 19/30  bone]
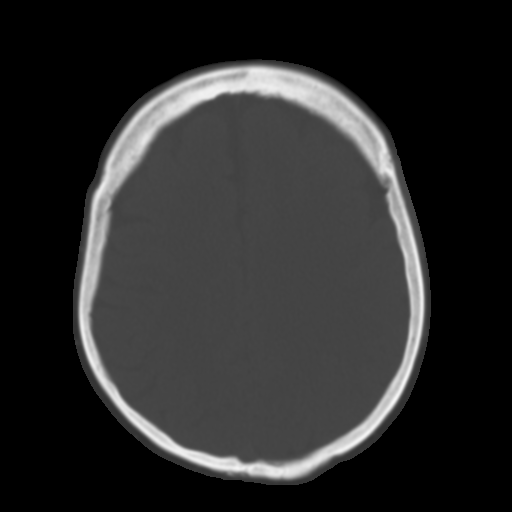
[im 22/30  brain]
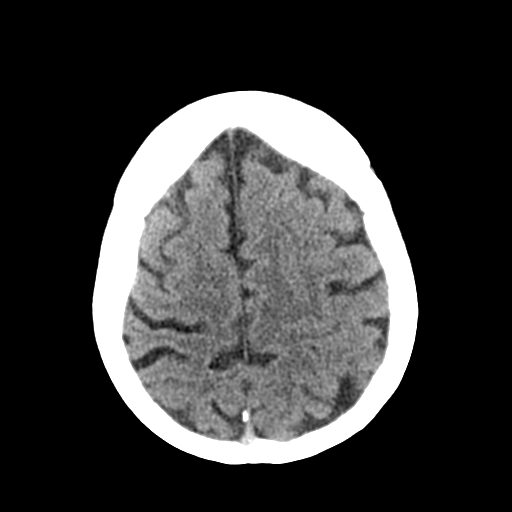
[im 26/30  brain]
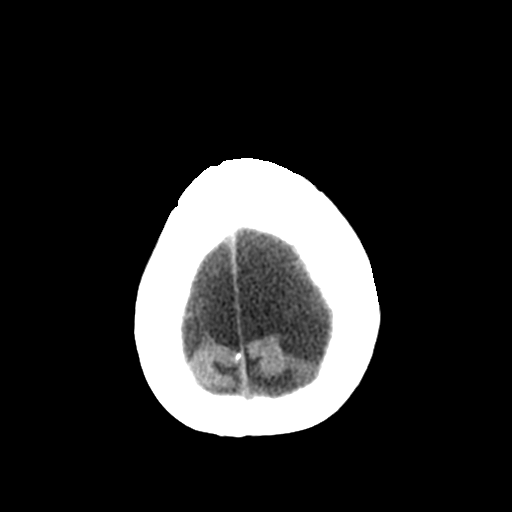

[Series 4: coronal soft tissue · coronal · 0.31mm/px · 3 of 63 slices shown]
[im 21/63  brain]
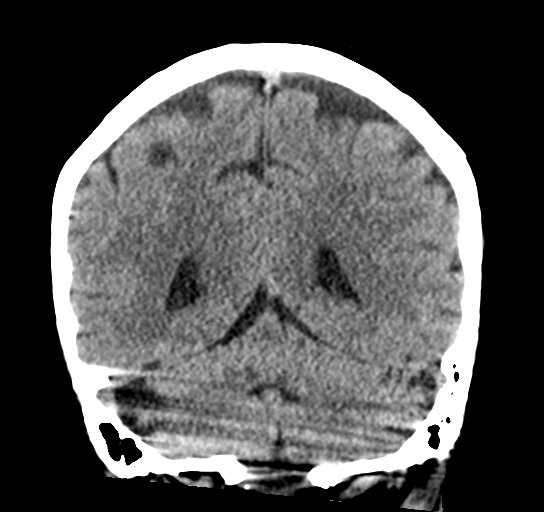
[im 28/63  brain]
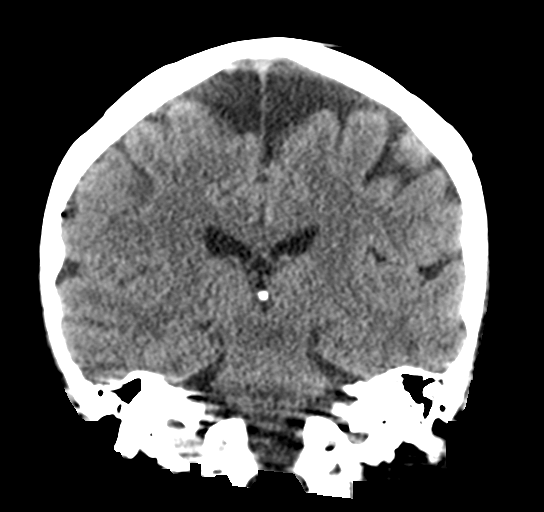
[im 35/63  brain]
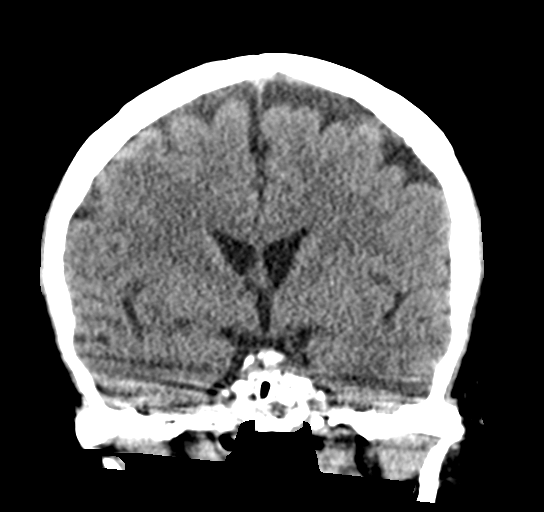

[Series 5: sagittal soft tissue · sagittal · 0.31mm/px · 3 of 58 slices shown]
[im 20/58  brain]
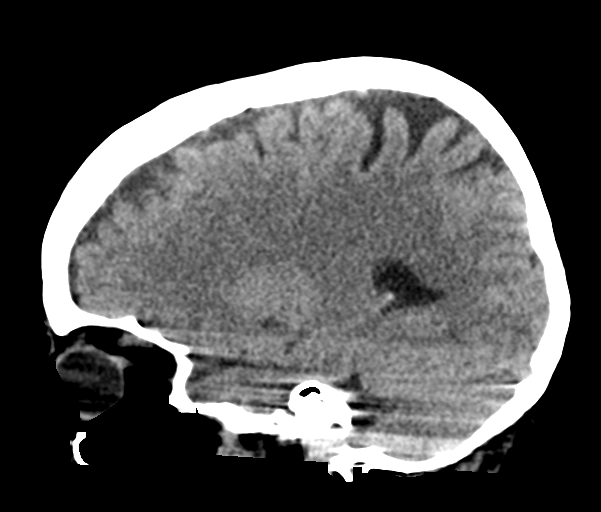
[im 29/58  brain]
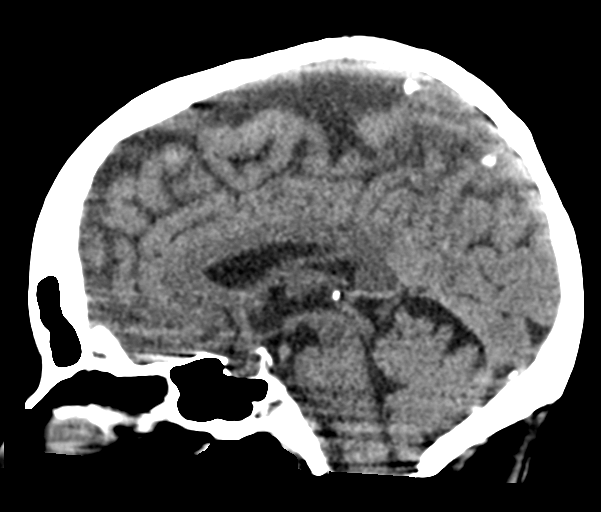
[im 39/58  brain]
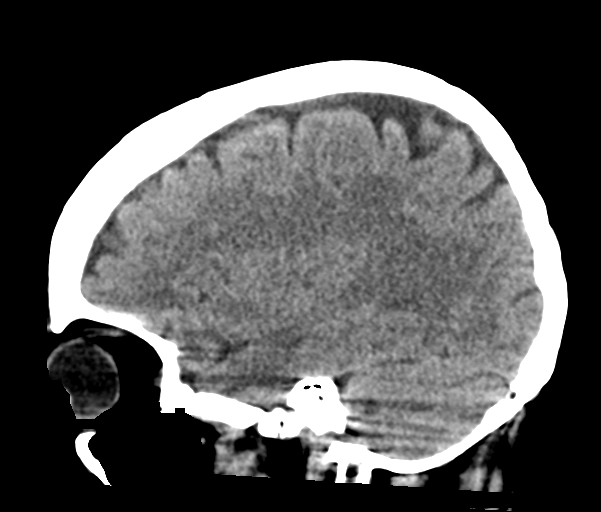

[17 of 47 positions shown; findings below may reference images not displayed]

FINDINGS: Brain: No evidence of acute infarction, hemorrhage, hydrocephalus,
extra-axial collection or mass lesion/mass effect.

Vascular: No hyperdense vessel or unexpected calcification.

Skull: Normal. Negative for fracture or focal lesion.

Sinuses/Orbits: No acute finding.

Other: None.
IMPRESSION: No acute intracranial abnormality noted.

## 2022-05-15 IMAGING — CR DG CHEST 2V
2 series · 2 of 2 positions shown · non-contrast
Comparison: Chest CT and chest radiograph August 18, 2020

CLINICAL DATA: Shortness of breath

EXAM:
CHEST - 2 VIEW

[chest pa]
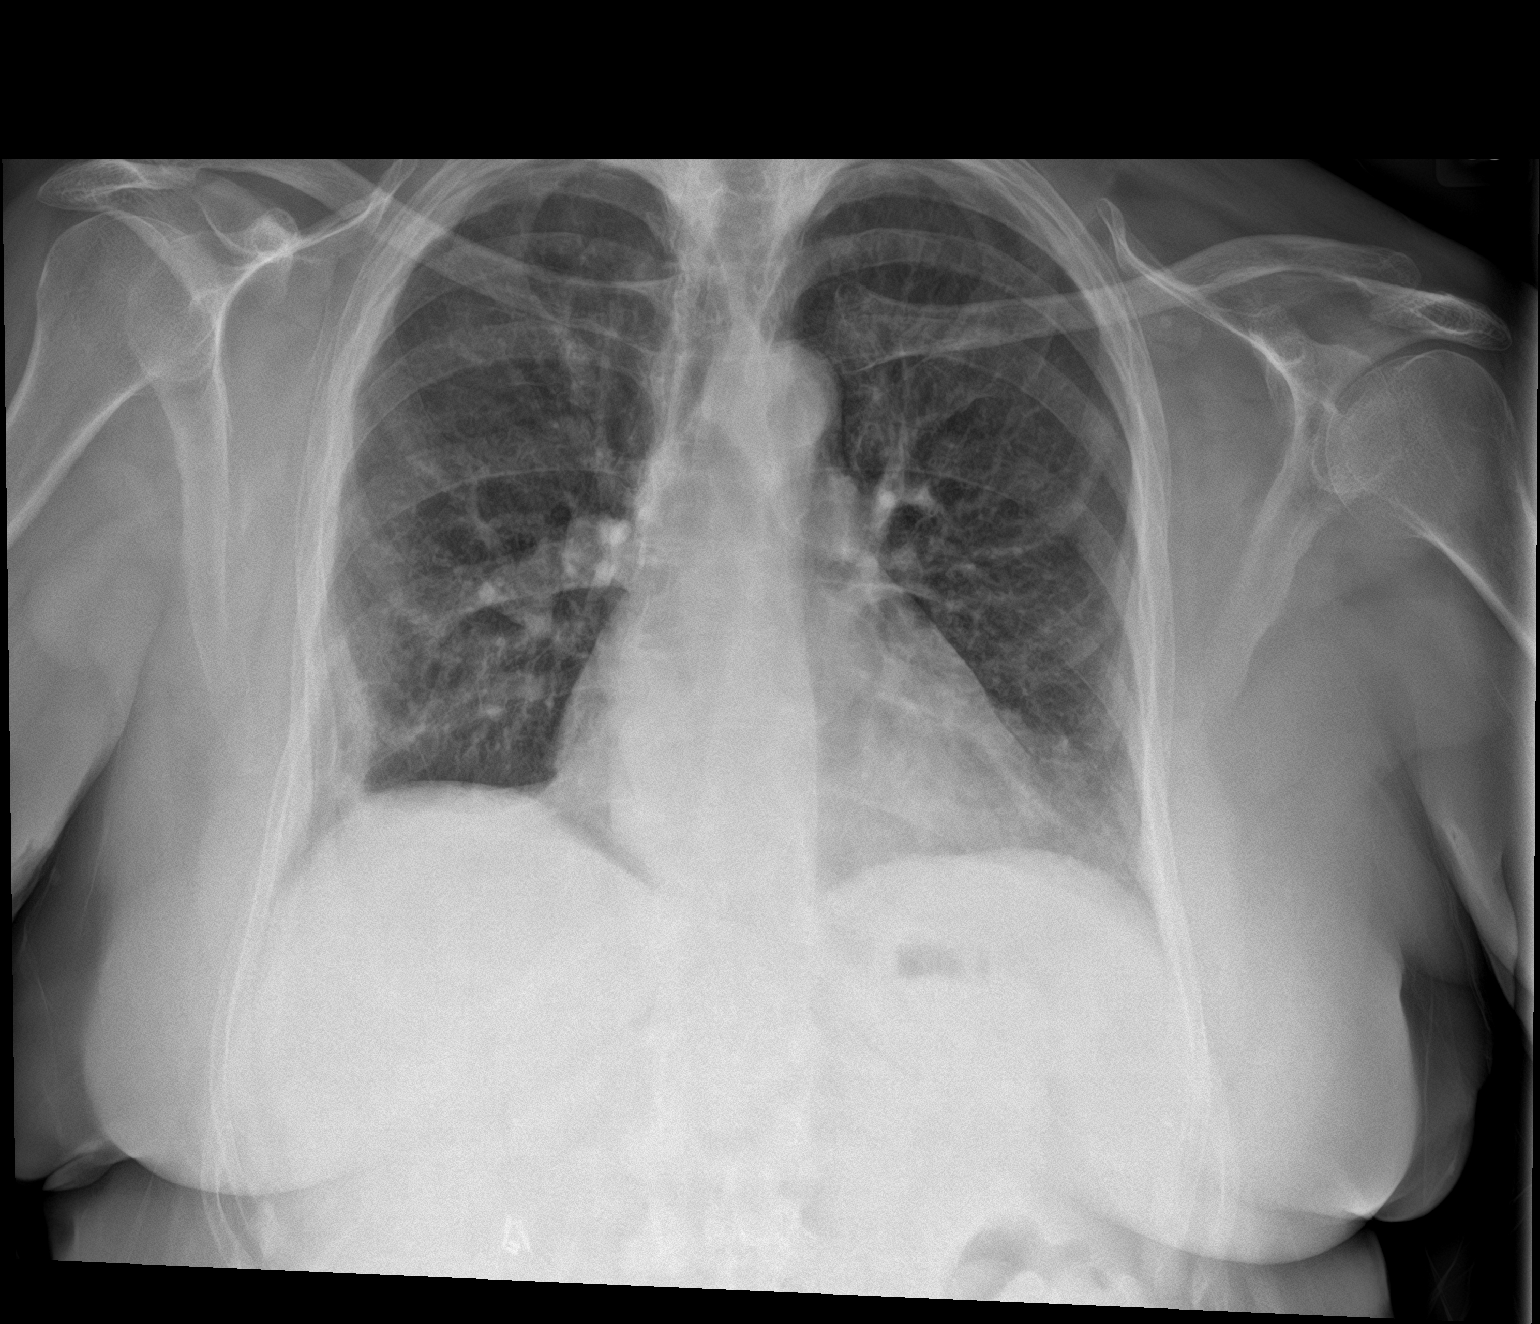

[chest lat]
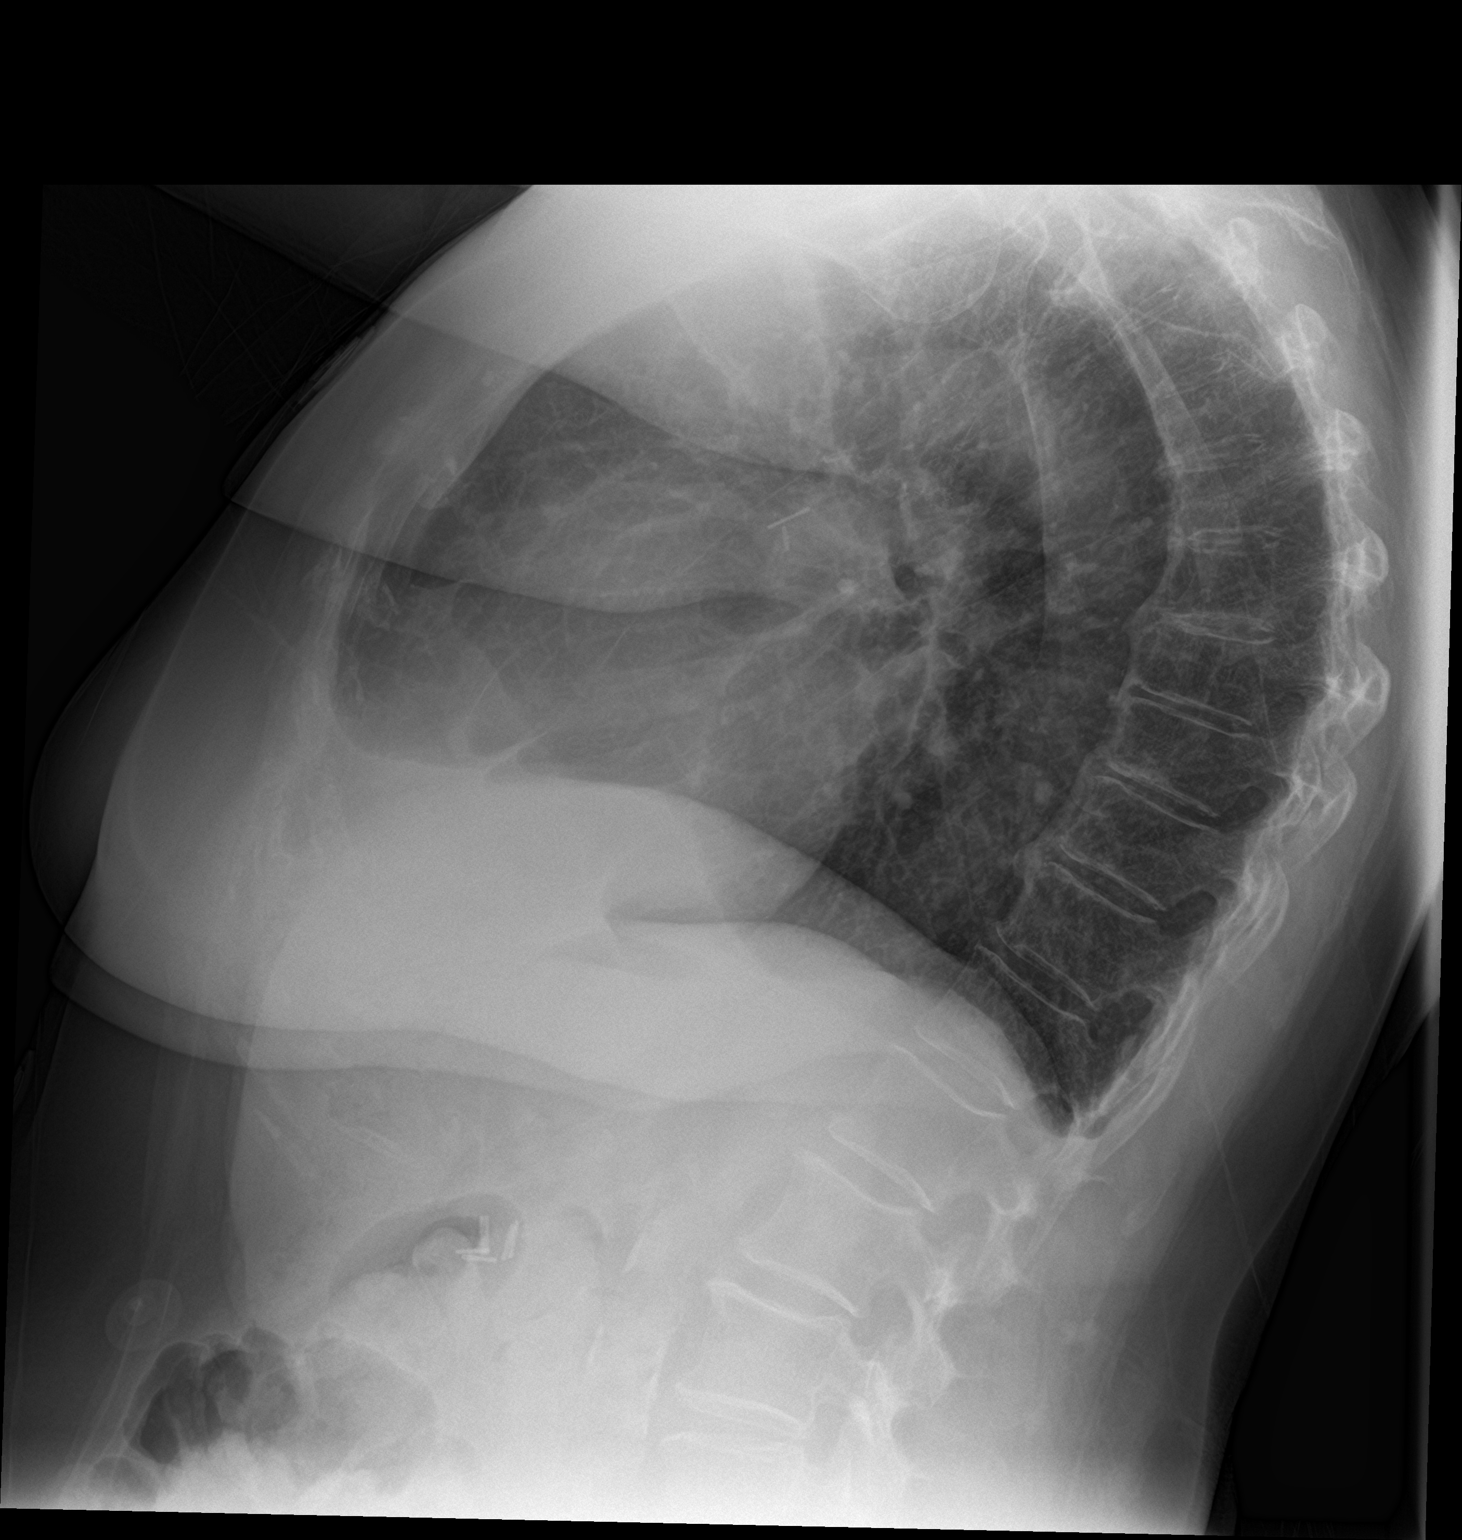

[2 of 2 positions shown; findings below may reference images not displayed]

FINDINGS: There is no appreciable edema or airspace opacity. The heart size is
upper normal; pulmonary vascularity normal. No adenopathy. Pleural
thickening along the lateral right hemithorax inferiorly is stable.
There is degenerative change in the thoracic spine. Foci of aortic
atherosclerosis noted. Surgical clips right upper quadrant of the
abdomen noted.
IMPRESSION: Inferolateral right pleural thickening, stable. No edema or airspace
opacity. Heart size upper normal. No adenopathy. Aortic
Atherosclerosis ([id number]-319.9).

## 2022-05-20 ENCOUNTER — Emergency Department: Payer: Medicare Other

## 2022-05-20 ENCOUNTER — Emergency Department
Admission: EM | Admit: 2022-05-20 | Discharge: 2022-05-20 | Disposition: A | Discharge status: Home / Self Care | Payer: Medicare Other | Attending: Emergency Medicine | Admitting: Emergency Medicine

## 2022-05-20 ENCOUNTER — Encounter: Payer: Self-pay | Admitting: Emergency Medicine

## 2022-05-20 ENCOUNTER — Other Ambulatory Visit: Payer: Self-pay

## 2022-05-20 DIAGNOSIS — J449 Chronic obstructive pulmonary disease, unspecified: Secondary | ICD-10-CM | POA: Diagnosis not present

## 2022-05-20 DIAGNOSIS — I5023 Acute on chronic systolic (congestive) heart failure: Secondary | ICD-10-CM | POA: Insufficient documentation

## 2022-05-20 DIAGNOSIS — M79605 Pain in left leg: Secondary | ICD-10-CM | POA: Diagnosis not present

## 2022-05-20 DIAGNOSIS — R29818 Other symptoms and signs involving the nervous system: Secondary | ICD-10-CM | POA: Insufficient documentation

## 2022-05-20 DIAGNOSIS — Z1152 Encounter for screening for COVID-19: Secondary | ICD-10-CM | POA: Diagnosis not present

## 2022-05-20 DIAGNOSIS — M7989 Other specified soft tissue disorders: Secondary | ICD-10-CM | POA: Diagnosis not present

## 2022-05-20 DIAGNOSIS — R531 Weakness: Secondary | ICD-10-CM | POA: Diagnosis not present

## 2022-05-20 DIAGNOSIS — I110 Hypertensive heart disease with heart failure: Secondary | ICD-10-CM | POA: Diagnosis not present

## 2022-05-20 DIAGNOSIS — I509 Heart failure, unspecified: Secondary | ICD-10-CM | POA: Diagnosis not present

## 2022-05-20 DIAGNOSIS — G20C Parkinsonism, unspecified: Secondary | ICD-10-CM | POA: Insufficient documentation

## 2022-05-20 DIAGNOSIS — R300 Dysuria: Secondary | ICD-10-CM | POA: Insufficient documentation

## 2022-05-20 DIAGNOSIS — C7800 Secondary malignant neoplasm of unspecified lung: Secondary | ICD-10-CM | POA: Insufficient documentation

## 2022-05-20 LAB — URINALYSIS, ROUTINE W REFLEX MICROSCOPIC
Bilirubin Urine: NEGATIVE
Glucose, UA: NEGATIVE mg/dL
Ketones, ur: NEGATIVE mg/dL
Nitrite: POSITIVE — AB
Protein, ur: NEGATIVE mg/dL
Specific Gravity, Urine: 1.013 (ref 1.005–1.030)
Squamous Epithelial / HPF: NONE SEEN (ref 0–5)
pH: 5 (ref 5.0–8.0)

## 2022-05-20 LAB — COMPREHENSIVE METABOLIC PANEL
ALT: 18 U/L (ref 0–44)
AST: 23 U/L (ref 15–41)
Albumin: 3.6 g/dL (ref 3.5–5.0)
Alkaline Phosphatase: 61 U/L (ref 38–126)
Anion gap: 6 (ref 5–15)
BUN: 30 mg/dL — ABNORMAL HIGH (ref 8–23)
CO2: 24 mmol/L (ref 22–32)
Calcium: 9.1 mg/dL (ref 8.9–10.3)
Chloride: 107 mmol/L (ref 98–111)
Creatinine, Ser: 1.07 mg/dL — ABNORMAL HIGH (ref 0.44–1.00)
GFR, Estimated: 55 mL/min — ABNORMAL LOW (ref >60)
Glucose, Bld: 140 mg/dL — ABNORMAL HIGH (ref 70–99)
Potassium: 3.3 mmol/L — ABNORMAL LOW (ref 3.5–5.1)
Sodium: 137 mmol/L (ref 135–145)
Total Bilirubin: 0.3 mg/dL (ref 0.3–1.2)
Total Protein: 6.5 g/dL (ref 6.5–8.1)

## 2022-05-20 LAB — CBC
HCT: 37.2 % (ref 36.0–46.0)
Hemoglobin: 11.9 g/dL — ABNORMAL LOW (ref 12.0–15.0)
MCH: 29 pg (ref 26.0–34.0)
MCHC: 32 g/dL (ref 30.0–36.0)
MCV: 90.7 fL (ref 80.0–100.0)
Platelets: 254 10*3/uL (ref 150–400)
RBC: 4.1 MIL/uL (ref 3.87–5.11)
RDW: 13.6 % (ref 11.5–15.5)
WBC: 10.7 10*3/uL — ABNORMAL HIGH (ref 4.0–10.5)
nRBC: 0 % (ref 0.0–0.2)

## 2022-05-20 LAB — RESP PANEL BY RT-PCR (RSV, FLU A&B, COVID)  RVPGX2
Influenza A by PCR: NEGATIVE
Influenza B by PCR: NEGATIVE
Resp Syncytial Virus by PCR: NEGATIVE
SARS Coronavirus 2 by RT PCR: NEGATIVE

## 2022-05-20 LAB — BRAIN NATRIURETIC PEPTIDE: B Natriuretic Peptide: 320.6 pg/mL — ABNORMAL HIGH (ref 0.0–100.0)

## 2022-05-20 LAB — TROPONIN I (HIGH SENSITIVITY)
Troponin I (High Sensitivity): 12 ng/L (ref <18)
Troponin I (High Sensitivity): 11 ng/L (ref <18)

## 2022-05-20 MED ORDER — SODIUM CHLORIDE 0.9 % IV BOLUS
500.0000 mL | Freq: Once | INTRAVENOUS | Status: AC
  Administered 2022-05-20: 500 mL via INTRAVENOUS

## 2022-05-20 MED ORDER — FUROSEMIDE 20 MG PO TABS: 20.0000 mg | ORAL_TABLET | Freq: Every day | ORAL | 11 refills | Status: DC

## 2022-05-20 MED ORDER — CEPHALEXIN 500 MG PO CAPS: 500.0000 mg | ORAL_CAPSULE | Freq: Three times a day (TID) | ORAL | 0 refills | Status: AC

## 2022-05-20 MED ORDER — ONDANSETRON HCL 4 MG/2ML IJ SOLN
4.0000 mg | Freq: Once | INTRAMUSCULAR | Status: AC
  Administered 2022-05-20: 4 mg via INTRAVENOUS
  Filled 2022-05-20: qty 2

## 2022-05-20 MED ORDER — FUROSEMIDE 10 MG/ML IJ SOLN
20.0000 mg | Freq: Once | INTRAMUSCULAR | Status: AC
  Administered 2022-05-20: 20 mg via INTRAVENOUS
  Filled 2022-05-20: qty 4

## 2022-05-20 MED ORDER — MORPHINE SULFATE (PF) 4 MG/ML IV SOLN
4.0000 mg | Freq: Once | INTRAVENOUS | Status: AC
  Administered 2022-05-20: 4 mg via INTRAVENOUS
  Filled 2022-05-20: qty 1

## 2022-05-20 NOTE — ED Notes (Signed)
The pt was placed on a purwick.

## 2022-05-20 NOTE — Progress Notes (Signed)
CSW contacted Malachy Mood with [facility] who can accept patient for home health PT once orders are placed.

## 2022-05-20 NOTE — Discharge Instructions (Addendum)
Follow-up with your neurologist on Wednesday.  Ask them for home health and physical therapy. The antibiotic as prescribed.  Lasix daily to decrease the fluid buildup.  Return if worsening  Patient to discharge to home with home health

## 2022-05-20 NOTE — ED Provider Notes (Signed)
[facility] Provider Note    Event Date/Time   First MD Initiated Contact with Patient 05/20/22 1219     (approximate)   History   Leg Pain   HPI  Victoria Hanson is a 71 y.o. female history of Parkinson's, lung mets, COPD, CHF, hypertension presents emergency department with weakness and dizziness along with swelling and the legs, more swelling in the left leg than the right.  States that symptoms have worsened over the last 2 days.  Has been unable to use her left arm for 2 days.  Denies fever chills cough or congestion      Physical Exam   Triage Vital Signs: ED Triage Vitals  Enc Vitals Group     BP 05/20/22 1207 125/70     Pulse Rate 05/20/22 1207 78     Resp 05/20/22 1207 17     Temp 05/20/22 1207 98.6 F (37 C)     Temp Source 05/20/22 1207 Oral     SpO2 05/20/22 1207 91 %     Weight 05/20/22 1207 170 lb (77.1 kg)     Height 05/20/22 1220 5\' 4"  (1.626 m)     Head Circumference --      Peak Flow --      Pain Score 05/20/22 1207 0     Pain Loc --      Pain Edu? --      Excl. in [place]? --     Most recent vital signs: Vitals:   05/20/22 1207  BP: 125/70  Pulse: 78  Resp: 17  Temp: 98.6 F (37 C)  SpO2: 91%     General: Awake, no distress.   CV:  Good peripheral perfusion. regular rate and  rhythm Resp:  Normal effort. Lungs cta Abd:  No distention.   Other:  Swelling noted in both lower extremities, worse on the left than the right, tenderness along the left calf, neurovascular is intact   ED Results / Procedures / Treatments   Labs (all labs ordered are listed, but only abnormal results are displayed) Labs Reviewed  CBC - Abnormal; Notable for the following components:      Result Value   WBC 10.7 (*)    Hemoglobin 11.9 (*)    All other components within normal limits  COMPREHENSIVE METABOLIC PANEL - Abnormal; Notable for the following components:   Potassium 3.3 (*)    Glucose, Bld 140 (*)    BUN 30 (*)     Creatinine, Ser 1.07 (*)    GFR, Estimated 55 (*)    All other components within normal limits  BRAIN NATRIURETIC PEPTIDE - Abnormal; Notable for the following components:   B Natriuretic Peptide 320.6 (*)    All other components within normal limits  RESP PANEL BY RT-PCR (RSV, FLU A&B, COVID)  [id number]  URINE CULTURE  URINALYSIS, ROUTINE W REFLEX MICROSCOPIC  TROPONIN I (HIGH SENSITIVITY)  TROPONIN I (HIGH SENSITIVITY)     EKG  EKG   RADIOLOGY Chest x-ray, CT of the head, ultrasound left lower extremity    PROCEDURES:   Procedures   MEDICATIONS ORDERED IN ED: Medications  furosemide (LASIX) injection 20 mg (20 mg Intravenous Given 05/20/22 1511)  sodium chloride 0.9 % bolus 500 mL (500 mLs Intravenous New Bag/Given 05/20/22 1506)  morphine (PF) 4 MG/ML injection 4 mg (4 mg Intravenous Given 05/20/22 1512)  ondansetron (ZOFRAN) injection 4 mg (4 mg Intravenous Given 05/20/22 1511)     IMPRESSION / MDM /  ASSESSMENT AND PLAN / ED COURSE  I reviewed the triage vital signs and the nursing notes.                              Differential diagnosis includes, but is not limited to, CVA, CHF, DVT  Patient's presentation is most consistent with acute presentation with potential threat to life or bodily function.   Labs and imaging ordered, EKG ordered   EKG shown nsr, no stemi CT head, CXR, and [place] of lower extremity [place] independently reviewed and interpreted by me as being negative for any acute abnormality  Patient's labs are mostly reassuring, BNP is slightly elevated indicating a mild amount of CHF, will give patient 20 of Lasix IV to decrease fluid buildup.  Will discharge her with Lasix 20 mg p.o. daily.  Also start her on antibiotic Keflex 500 3 times daily for her dysuria.  Urine and urine culture are still pending.  Patient is to be discharged home.  Follow-up with her neurologist on Wednesday.  I did contact social work for home health.  She we will see if the  patient qualifies for any home health.  However the patient could still work on this issue with her neurologist.  Dr. Corky Downs in to see the patient.  He also agrees patient does not need admission.  She was discharged in stable condition.   FINAL CLINICAL IMPRESSION(S) / ED DIAGNOSES   Final diagnoses:  Acute on chronic systolic congestive heart failure (HCC)  Dysuria  Left leg pain     Rx / DC Orders   ED Discharge Orders          Ordered    furosemide (LASIX) 20 MG tablet  Daily        05/20/22 1614    cephALEXin (KEFLEX) 500 MG capsule  3 times daily        05/20/22 1614             Note:  This document was prepared using Dragon voice recognition software and may include unintentional dictation errors.    Versie Starks, PA-C 05/20/22 1625    Lavonia Drafts, MD 05/20/22 [id number]

## 2022-05-20 NOTE — ED Triage Notes (Signed)
Pt sts that she has been having leg swelling and pain. Pt sister sts that she has not been walking like she normally does d/t her not being able to walk.

## 2022-05-21 NOTE — Progress Notes (Unsigned)
Assessment/Plan:    1.  Parkinson's disease, mod   -The biggest issue that I see in her review of her medicines has been intolerance to medication.  Discussed with her that all patients will need levodopa quite some time along the course of the disease.  She was on levodopa for quite some time and tolerating it well, but then it was stopped at the end of August.  Records report that it was stopped due to a feeling of generalized weakness and low blood pressures, but she was left on blood pressure medication.  Patient states that it was stopped due to shortness of breath.  Patient admits that she did markedly better when she was on levodopa, with the exception of the feeling of shortness of breath.  My suspicion is that her blood pressure was lower when she was on levodopa (but she did not take the midodrine), so she had a sensation that she had shortness of breath.  After some discussion, she was agreeable to restarting levodopa.  We decided to start with the CR, since it does not release as fast, but I also told her that it did not work quite as well.  We will slowly work her up over the next 3 weeks to carbidopa/levodopa 25/100 CR, 1 tablet 3 times per day.  There is no doubt that this will not be enough.  She was previously on 2 tablets 3 times per day of the immediate release, and she will need at least this much of the CR, and likely more.  -there were several medications that she was only on for 1 day up to 1 week at that were not tolerated because of potential side effects.  As above, I would like to start over with her medication regimen and just start her with extended release levodopa, since it potentially has less side effects (while potentially being slightly less efficacious).  We will then work from there. -She asked me if she should stay on the Neupro.  I told her to stay on it for the next 3 weeks, and then she can discontinue it.  She finds that she has had no benefit from  it.  -currently on med for UTI  -PT/OT/ST will be ordered at [facility].  -She asked for home nurse to help with bathing.  I told her I doubt her insurance would pay for it, but her sister felt confident that they would.  I went ahead and sent an order, but told her that if they did not pay, there was likely nothing more that we could do in that regard.  All  2.  Sialorrhea  -This is commonly associated with PD.  We talked about treatments.  The patient is not a candidate for oral anticholinergic therapy because of increased risk of confusion and falls.  We discussed Botox (type A and B) and 1% atropine drops.  We discusssed that candy like lemon drops can help by stimulating mm of the oropharynx to induce swallowing.  3.  Constipation  -discussed nature and pathophysiology and association with PD  -discussed importance of hydration.  Pt is to increase water intake  -pt is given a copy of the rancho recipe  -recommended daily colace  -recommended miralax prn  4.  Hx of Neurogenic Orthostatic Hypotension  -has midodrine at home but hasn't taken  -will need to watch the norvasc.  She admits she has not been taking lately  5. We will see her back in early March,  at which point I hope to increase her levodopa further.   Subjective:   Victoria Hanson was seen today in the movement disorders clinic for neurologic consultation at the request of Einar Pheasant, MD.  The consultation is for the evaluation of Parkinson's disease.  Pts sister is with her and accompanies the patient and supplements the history.   He has been followed by [facility] neurology for many years.  She was first seen by Dr. Manuella Ghazi in March, 2017, but that was primarily for headaches.  She followed back up 1 year later with the NP (Ouma) and was dx with Parkinsons Disease  in 07/2016.  Symptoms were tremors of the bilateral upper extremities.  On examination, she was reported to not be rigid at that visit, to have bilateral hand  tremor and mild cogwheeling on the right (but wrote that tone was normal).  At that visit she was started on both carbidopa/levodopa 25/100, 1 tablet 3 times per day as well as ropinirole, 0.5 mg 3 times per day.  They patient called back 1 day later stating that the ropinirole made her sleepy.  She was on starting dose of levodopa until March, 2019, at which time she was told to take levodopa, 2 pills 3 times per day. Her care was changed from Rufina Falco to Gayland Curry when Ms. Ouma left the practice.  This was in January, 2020.  At that point in time, she was told to take carbidopa/levodopa 25/100, 1.5 tablets 4 times per day but she had some dyskinesia on this and her dose was slightly lowered.  Carbidopa/levodopa 50/200 at bedtime was added in August, 2020.  Pramipexole and amantadine were added in November, 21.  Several notes state that she did not take these medications, but further on one note stated that she had hallucinations with pramipexole and it was discontinued.  She was seen by neuro PA in 02/08/22 and he d/c her levodopa due to feeling weak and hypotension.  She was told to continue her amlodipine. Pt states that carbidopa/levodopa caused "breathing trouble" and when the carbidopa/levodopa was d/c the "breathing trouble" went away. Patient was seen on March 12, 2022.  She was restarted on ropinirole.  She was only on 0.25 mg and her daughter called back 7 days later stating that it was making her blood pressure dropped.  Of note, she previously had a dx of Neurogenic Orthostatic Hypotension and was on midodrine.  She f/u on 04/13/2022 and was started on neupro.  She is on 2 mg daily.  She doesn't think it helps.    Current movement medication: Neupro, 2 mg daily   Specific Symptoms:  Tremor: Yes.   Per pt in bilateral UE when eating/drinking; sister does not see it at rest or with action Family hx of similar:  No. Voice: weak, has never been to voice therapy Sleep:   Vivid Dreams:   No.  Acting out dreams:  No. Wet Pillows: Yes.   Postural symptoms:  Yes.  , states that she walks very little.  When on carbidopa/levodopa she could walk but she could not breathe well  Falls?  No., daughter/children live with patient and help Bradykinesia symptoms: shuffling gait, slow movements, drooling while awake, and difficulty getting out of a chair Loss of smell:  No. Loss of taste:  No. Urinary Incontinence:  Yes.   X years Difficulty Swallowing:  No. Handwriting, micrographia: Yes.   Trouble with ADL's:  Yes.    Trouble buttoning clothing: Yes.  Hallucinations:  only with pramipexole N/V:  No. Lightheaded:  Yes.    Syncope: No. Diplopia:  No. Dyskinesia:  No. Prior exposure to reglan/antipsychotics: No.   PREVIOUS MEDICATIONS:  ropinirole (took 1 day and said made sleepy, but levodopa was also started the same day; retried several years later at 0.25 mg and they called a week later and stated caused low BP but already had dx of noh); pramipexole (unclear if took it; 1 note stated that she did not take it and another note stated that she had hallucinations with it - pt does recall hallucinations with "one medication"); midodrine (given but didn't take it)  ALLERGIES:   Allergies  Allergen Reactions   Macrobid [Nitrofurantoin Macrocrystal] Shortness Of Breath and Palpitations   Antihistamines, Diphenhydramine-Type Other (See Comments)    She does not know which antihistamine she has an allergic reaction to.   Aspirin Other (See Comments)    Makes her sick on the stomach if she takes too many and causes bleeding.  Of note, she can take ibuprofen. Other reaction(s): Other (See Comments) Makes her sick on the stomach.  Of note, she can take ibuprofen. Makes her sick on the stomach.  Of note, she can take ibuprofen.   Ceftin [Cefuroxime Axetil] Other (See Comments)    Chest pains   Celecoxib Other (See Comments)    Reaction: Blood Disorder GI bleeding.   Cymbalta  [Duloxetine Hcl] Other (See Comments)    Reaction: unknown   Diphenhydramine Other (See Comments)   Escitalopram Other (See Comments)   Lexapro [Escitalopram Oxalate] Other (See Comments)    Reaction: Difficulty with speech   Metronidazole Other (See Comments)    Other reaction(s): Unknown   Nitrofurantoin Other (See Comments)   Zoloft [Sertraline Hcl]    Ciprofloxacin Other (See Comments)    Reaction: unknown    CURRENT MEDICATIONS:  Current Meds  Medication Sig   ALPRAZolam (XANAX) 0.5 MG tablet TAKE 1/2 TABLET BY MOUTH ONCE DAILY AS NEEDED   amLODipine (NORVASC) 5 MG tablet Take 1.5 tablets (7.5 mg total) by mouth daily. (5 mg + 2.5 mg qd)   aspirin EC 81 MG tablet Take 81 mg by mouth as needed.    cephALEXin (KEFLEX) 500 MG capsule Take 1 capsule (500 mg total) by mouth 3 (three) times daily for 10 days.   citalopram (CELEXA) 10 MG tablet TAKE ONE AND ONE-HALF TABLET BY MOUTH DAILY   furosemide (LASIX) 20 MG tablet Take 1 tablet (20 mg total) by mouth daily.   HYDROcodone-acetaminophen (NORCO/VICODIN) 5-325 MG tablet Take 1 tablet by mouth every 6 (six) hours as needed for moderate pain.   montelukast (SINGULAIR) 10 MG tablet TAKE 1 TABLET BY MOUTH AT BEDTIME   PROAIR HFA 108 (90 Base) MCG/ACT inhaler INHALE 2 PUFFS BY MOUTH INTO THE LUNGS EVERY 6 HOURS AS NEEDED FOR WHEEZING   rosuvastatin (CRESTOR) 5 MG tablet TAKE 1 TABLET BY MOUTH EVERY MONDAY, WEDNESDAY AND FRIDAY   Rotigotine (NEUPRO) 1 MG/24HR PT24 Place 1 patch (1 mg total) onto the skin daily.   traZODone (DESYREL) 50 MG tablet TAKE 2 TABLETS BY MOUTH AT BEDTIME   vitamin B-12 (CYANOCOBALAMIN) 100 MCG tablet Take 100 mcg by mouth daily.     Objective:   VITALS:   Vitals:   05/23/22 0959  BP: 126/82  Pulse: 77  SpO2: 99%  Weight: 177 lb (80.3 kg)  Height: 5\' 4"  (1.626 m)    GEN:  The patient appears stated age and is  in NAD. HEENT:  Normocephalic, atraumatic.  The mucous membranes are moist. The superficial  temporal arteries are without ropiness or tenderness. CV:  RRR Lungs:  CTAB Neck/HEME:  There are no carotid bruits bilaterally.  Neurological examination:  Orientation: The patient is alert and oriented x3.  Cranial nerves: There is good facial symmetry.  There is facial hypomimia.  Extraocular muscles are intact. The visual fields are full to confrontational testing. The speech is fluent and hypophonic and dysarthric. Soft palate rises symmetrically and there is no tongue deviation. Hearing is intact to conversational tone. Sensation: Sensation is intact to light touch throughout. Motor: Strength is 5/5 in the bilateral upper and RLE.  She guards the LLE due to pain ever since knee surgery in 08/2021 but strength there is at least 4+/5 in the proximal and distal left lower extremity.    Shoulder shrug is equal and symmetric.  There is no pronator drift.   Movement examination: Tone: There is mod to severe increased tone , L>R, UE>LE Abnormal movements: none even with distraction procedures. Coordination:  There is mod decremation with RAM's, with any form of RAMS, including alternating supination and pronation of the forearm, hand opening and closing, finger taps, heel taps and toe taps. Gait and Station: The patient has significant difficulty arising out of a deep-seated chair without the use of the hands. She requires 2 person assist.  She is given a walker with 1 person assist.  She has freezing.  She shuffles and drags the L leg.  She does better in the open space.   I have reviewed and interpreted the following labs independently   Chemistry      Component Value Date/Time   NA 137 05/20/2022 1209   NA 140 02/17/2021 1029   NA 134 (L) 09/01/2014 1359   K 3.3 (L) 05/20/2022 1209   K 4.1 09/01/2014 1359   CL 107 05/20/2022 1209   CL 103 09/01/2014 1359   CO2 24 05/20/2022 1209   CO2 27 09/01/2014 1359   BUN 30 (H) 05/20/2022 1209   BUN 18 02/17/2021 1029   BUN 23 (H) 09/01/2014  1359   CREATININE 1.07 (H) 05/20/2022 1209   CREATININE 1.16 (H) 01/14/2017 1217      Component Value Date/Time   CALCIUM 9.1 05/20/2022 1209   CALCIUM 8.8 (L) 09/01/2014 1359   ALKPHOS 61 05/20/2022 1209   ALKPHOS 85 09/01/2014 1359   AST 23 05/20/2022 1209   AST 16 09/01/2014 1359   ALT 18 05/20/2022 1209   ALT 9 (L) 09/01/2014 1359   BILITOT 0.3 05/20/2022 1209   BILITOT 0.4 09/01/2014 1359      Lab Results  Component Value Date   TSH 1.67 03/09/2022   Lab Results  Component Value Date   WBC 10.7 (H) 05/20/2022   HGB 11.9 (L) 05/20/2022   HCT 37.2 05/20/2022   MCV 90.7 05/20/2022   PLT 254 05/20/2022     Total time spent on today's visit was 71minutes, including both face-to-face time and nonface-to-face time.  Time included that spent on review of records (prior notes available to me/labs/imaging if pertinent), discussing treatment and goals, answering patient's questions and coordinating care.  Cc:  Einar Pheasant, MD

## 2022-05-22 LAB — URINE CULTURE: Culture: >=100000 — AB

## 2022-05-23 ENCOUNTER — Telehealth: Payer: Self-pay

## 2022-05-23 ENCOUNTER — Other Ambulatory Visit: Payer: Self-pay

## 2022-05-23 ENCOUNTER — Ambulatory Visit (INDEPENDENT_AMBULATORY_CARE_PROVIDER_SITE_OTHER): Payer: Medicare Other | Admitting: Neurology

## 2022-05-23 ENCOUNTER — Encounter: Payer: Self-pay | Admitting: Neurology

## 2022-05-23 VITALS — BP 126/82 | HR 77 | Ht 64.0 in | Wt 177.0 lb

## 2022-05-23 DIAGNOSIS — R498 Other voice and resonance disorders: Secondary | ICD-10-CM

## 2022-05-23 DIAGNOSIS — G20A2 Parkinson's disease without dyskinesia, with fluctuations: Secondary | ICD-10-CM | POA: Diagnosis not present

## 2022-05-23 DIAGNOSIS — G903 Multi-system degeneration of the autonomic nervous system: Secondary | ICD-10-CM | POA: Diagnosis not present

## 2022-05-23 MED ORDER — CARBIDOPA-LEVODOPA ER 25-100 MG PO TBCR: EXTENDED_RELEASE_TABLET | ORAL | 0 refills | Status: DC

## 2022-05-23 NOTE — Telephone Encounter (Signed)
Sent prescription in and tried to call patient and sister unable to get through on either number

## 2022-05-23 NOTE — Telephone Encounter (Signed)
Patient's daughter is calling in stating that Victoria Hanson was supposed to have a new medication, Carbidopa Levodopa (Sinemet cr) called in but the pharmacy does not have it. Wondering when this will be sent in.

## 2022-05-23 NOTE — Patient Instructions (Addendum)
Constipation and Parkinson's disease:  1.Rancho recipe for constipation in Parkinsons Disease:  -1 cup of unprocessed bran (need to get this at [employer], [employer] or similar type of store), 2 cups of applesauce in 1 cup of prune juice 2.  Increase fiber intake (Metamucil,vegetables) 3.  Regular, moderate exercise can be beneficial. 4.  Avoid medications causing constipation, such as medications like antacids with calcium or magnesium 5.  It's okay to take daily Miralax, and taper if stools become too loose or you experience diarrhea 6.  Stool softeners (Colace) can help with chronic constipation and I recommend you take this daily. 7.  Increase water intake.  You should be drinking 1/2 gallon of water a day as long as you have not been diagnosed with congestive heart failure or renal/kidney failure.  This is probably the single greatest thing that you can do to help your constipation.  Start Carbidopa Levodopa (Sinemet cr) as follows: Week 1:  1 tablet at 7am Week 2:  1 tablet at 7am/11am Week 3:  1 tablet at 7am/11am/4pm    As a reminder, carbidopa/levodopa can be taken at the same time as a carbohydrate, but we like to have you take your pill either 30 minutes before a protein source or 1 hour after as protein can interfere with carbidopa/levodopa absorption.  Local and Online Resources for Power over Parkinson's Group  December 2023    LOCAL [place] PARKINSON'S GROUPS   Power over Parkinson's Group:    Power Over Parkinson's Patient Education Group will be Wednesday, December 13th-*Hybrid meting*- in person at [facility] location and via [facility], 2:00-3:00 pm.   Starting in November, Power over [employer] and Care Partner Groups will meet together, with plans for separate break out session for caregivers (*this will be evolving over the next few months) Upcoming Power over Parkinson's Meetings/Care Partner Support:  2nd Wednesdays of the month at 2 pm:   December 13th,  January 10th  Nina at [email or web address] if interested in participating in this group    [place] Party!  Wednesday, December 6th, 4:00-5:00 pm.  [facility] and Fitness.  RSVP to Garnetta Buddy at [phone] or [email or web address] New PWR! Moves Dynegy Instructor-Led Classes offering at [employer]!  TUESDAYS and Wednesdays 1-2 pm.   Contact Vonna Kotyk at  [email or web address]  or [phone] (Tuesday classes are modified for chair and standing only) Dance for Parkinson 's classes will be on Tuesdays 9:30am-10:30am starting October 3-December 12 with a break the week of November 21st. Located in the [employer] , in the first floor of the [employer] ([place].) To register:  [email or web address] or [phone]  Drumming for Parkinson's will be held on 2nd and 4th Mondays at 11:00 am.   Located at the [place] ([place]  [place] at [email or web address] or [phone]  Through support from the [place] for Parkinson's classes are free for both patients and caregivers.    [employer] Parkinson's Tai Chi Class, Mondays at 11 am.  Call [phone] for details   [place]:  [email or web address]  PD Health at Home continues:  Mindfulness Mondays, Wellness Wednesdays, Fitness Fridays   Upcoming Education:    Eating and Feeling Well through the [place]. Wednesday, Dec. 6th,  1-2 pm  Hospital Safety.  Wednesday, Dec. 13th, 1-2 pm Register for expert briefings (  webinars) at [email or web address]  Please check out their website to sign up for emails and see their full online offerings      [place]:  [email or web address]   Third  Thursday Webinars:  On the third Thursday of every month at 12 p.m. ET, join our free live webinars to learn about various aspects of living with Parkinson's disease and our work to speed medical breakthroughs.  Upcoming Webinar:  Tools for Diagnosing and Visualizing Parkinson's Disease.  Thursday, December 21st at 12 noon. Check out additional information on their website to see their full online offerings    [facility]:  [email or web address]  Upcoming Webinar:   Stay tuned  Webinar Series:  Living with Parkinson's Meetup.   Third Thursdays each month, 3 pm  Care Partner Monthly Meetup.  With Robin Searing Phinney.  First Tuesday of each month, 2 pm  Check out additional information to Live Well Today on their website    Parkinson and Movement Disorders (PMD) Alliance:  www.[email or web address]  [employer]:  Online Education Events  Sign up for emails, which are sent weekly to give you updates on programming and online offerings    Parkinson's Association of the [place]:  www.[email or web address]  Information on online support groups, education events, and online exercises including Yoga, Parkinson's exercises and more-LOTS of information on links to PD resources and online events  Virtual Support Group through Parkinson's Association of the [place]; next one is scheduled for Wednesday, December 6th  at 2 pm.  (These are typically scheduled for the 1st Wednesday of the month at 2 pm).  Visit website for details.   MOVEMENT AND EXERCISE OPPORTUNITIES  PWR! Moves Classes at [place].  Wednesdays 10 and 11 am.   Contact Amy Marriott, PT [email or web address] if interested.  NEW PWR! Moves Class offerings at [employer].  *TUESDAYS* and Wednesdays 1-2 pm.    Contact Vonna Kotyk at  [email or web address]    Parkinson's Wellness Recovery (PWR! Moves)  [email or web address]  Info on the PWR! Virtual Experience:  You will have  access to our expertise?through self-assessment, guided plans that start with the PD-specific fundamentals, educational content, tips, Q&A with an expert, and a growing Art therapist of PD-specific pre-recorded and live exercise classes of varying types and intensity - both physical and cognitive! If that is not enough, we offer 1:1 wellness consultations (in-person or virtual) to personalize your PWR! Research scientist (medical).   [place] Fridays:   As part of the PD Health @ Home program, this free video series focuses each week on one aspect of fitness designed to support people living with Parkinson's.? These weekly videos highlight the [place] fitness guidelines for people with Parkinson's disease.  [email or web address]   Dance for PD website is offering free, live-stream classes throughout the week, as well as links to [employer] of classes:  [email or web address]  Virtual dance and Pilates for Parkinson's classes: Click on the Community Tab> Parkinson's Movement Initiative Tab.  To register for classes and for more information, visit [email or web address] and click the "community" tab.   [employer] Parkinson's Cycling Classes   [employer]:  Thursdays @ Noon-Live classes at [employer] ([employer] at [email or web address]?or [phone])  [employer]: Virtual Classes Mondays and Thursdays Jeanette Caprice classes Tuesday, Wednesday and Thursday (contact [place] at [email or web address] ?or [phone])  [place]  Varied levels of classes are offered Tuesdays and Thursdays at [employer].   Stretching with Verdis Frederickson weekly class is also offered for people with  Parkinson's  To observe a class or for more information, call [phone] or email Hezzie Bump at [email or web address]   ADDITIONAL SUPPORT AND RESOURCES  [employer]:Online  Caregiver Education Opportunities:  [email or web address]/[email or web address]-education/[email or web address]-support-group.  You may also contact Vickki Muff at [email or web address] or [phone].     Well-Spring Navigator:  Just1Navigator program, a?free service to help individuals and families through the journey of determining care for older adults.  The "Navigator" is a Education officer, museum, Arnell Asal, who will speak with a prospective client and/or loved ones to provide an assessment of the situation and a set of recommendations for a personalized care plan -- all free of charge, and whether?[employer] offers the needed service or not. If the need is not a service we provide, we are well-connected with reputable programs in town that we can refer you to.  www.well-[email or web address] or to speak with the Navigator, call [phone].

## 2022-05-25 ENCOUNTER — Ambulatory Visit: Payer: Medicare Other | Attending: Neurology | Admitting: Occupational Therapy

## 2022-05-25 ENCOUNTER — Ambulatory Visit: Payer: Medicare Other | Admitting: Internal Medicine

## 2022-05-25 DIAGNOSIS — R2681 Unsteadiness on feet: Secondary | ICD-10-CM | POA: Diagnosis not present

## 2022-05-25 DIAGNOSIS — M6281 Muscle weakness (generalized): Secondary | ICD-10-CM | POA: Diagnosis not present

## 2022-05-25 DIAGNOSIS — R293 Abnormal posture: Secondary | ICD-10-CM | POA: Diagnosis not present

## 2022-05-25 DIAGNOSIS — R278 Other lack of coordination: Secondary | ICD-10-CM | POA: Diagnosis not present

## 2022-05-25 DIAGNOSIS — R262 Difficulty in walking, not elsewhere classified: Secondary | ICD-10-CM | POA: Diagnosis not present

## 2022-05-25 NOTE — Progress Notes (Deleted)
Patient ID: Victoria Hanson, female   DOB: 1950/09/26, 71 y.o.   MRN: [id number]   Subjective:    Patient ID: Victoria Hanson, female    DOB: 1951/04/15, 71 y.o.   MRN: [id number]   Patient here for  No chief complaint on file.  Marland Kitchen   HPI Here to follow up regarding her blood pressure, lower extremity swelling and parkinsons. She is accompanied by her daughter [name]).  History obtained from both of them. Was on neupro 1mg /24 patch.  Had lower extremity swelling with 2mg  patch.  Had hypotension with ropinirole. Saw neurology.  Recommended continuing 1mg  patch.   She reports the 1mg  patch did not work.  She could not move.  Started back on 2mg  patch - on her on.  Reported increased swelling since starting.   Recently saw Dr Carles Collet 05/23/22.  Recommended starting extended release levodopa. Instructed to stop neupro over the next three weeks.  Referred for PT/OT/ST.  No chest pain or sob reported.  No abdominal pain.  Trouble with constipation.  Recommended colace and miralax.  Evaluated ER 05/20/22 - weakness and dizziness with lower extremity swelling. BNP slightly elevated.  Given lasix.  Diagnosed with UTI and treated with keflex.  Swelling localized to lower extremities.  Discussed importance of wearing compression hose and keeping legs elevated.  She was wearing compression hose and was helping.  States lost one of the hose.  Plans to get another pair.  Discussed home health PT.  Does report persistent knee pain - s/p procedure.     Past Medical History:  Diagnosis Date   Anxiety    Asthma    Atypical chest pain    a. 2003 Cath: reportedly nl per pt [name]);  b. 04/2015 [place] Admission, neg troponin.   Colon cancer ([place]) 1999   a. 1999: Pt had partial colectomy. Did develop lung metastasis. Had right upper lobectomy in 01/2007 then had associated chemotherapy.   COPD (chronic obstructive pulmonary disease) (HCC)    DDD (degenerative disc disease), lumbosacral    Depression    Echocardiogram  abnormal    a. 02/2010 Echo: EF 55-60%, mild LAE, no regional wall motion abnormalities   Elevated transaminase level    a. ? NASH;  b. 06/2015 - nl LFTs.   GERD (gastroesophageal reflux disease)    Hematuria    Holter monitor, abnormal    a. 12/2009 Holter monitoring: NSR, rare PACs and PVCs.   Hypercholesterolemia    Hyperlipidemia    Hypertension    Leaky heart valve    a. Per pt report - no evidence of valvular abnormalities on prior echoes.   Lesion of skin of Right breast 10/20/2017   Lung metastases 2008   a. S/P resection (lobectomy - right)   Morbid obesity ([place])    Nephrolithiasis    pt denies   Parkinson disease 08/2016   [name]   Past Surgical History:  Procedure Laterality Date   BREAST EXCISIONAL BIOPSY Right 10/07/2009   benign   CARDIAC CATHETERIZATION  2003   negative as per pt report   CHOLECYSTECTOMY  2000   COLON SURGERY     Colon cancer, removed part of large colon   COLONOSCOPY N/A 04/25/2015   Procedure: COLONOSCOPY;  Surgeon: Manya Silvas, MD;  Location: [place];  Service: Endoscopy;  Laterality: N/A;   COLONOSCOPY WITH PROPOFOL N/A 06/14/2017   Procedure: COLONOSCOPY WITH PROPOFOL;  Surgeon: Manya Silvas, MD;  Location: [facility] ENDOSCOPY;  Service: Endoscopy;  Laterality:  N/A;   COLONOSCOPY WITH PROPOFOL N/A 11/17/2019   Procedure: COLONOSCOPY WITH PROPOFOL;  Surgeon: Wyline Mood, MD;  Location: [facility] ENDOSCOPY;  Service: Gastroenterology;  Laterality: N/A;   COLONOSCOPY WITH PROPOFOL N/A 03/21/2021   Procedure: COLONOSCOPY WITH PROPOFOL;  Surgeon: Regis Bill, MD;  Location: [facility] ENDOSCOPY;  Service: Endoscopy;  Laterality: N/A;   ESOPHAGOGASTRODUODENOSCOPY (EGD) WITH PROPOFOL N/A 03/21/2021   Procedure: ESOPHAGOGASTRODUODENOSCOPY (EGD) WITH PROPOFOL;  Surgeon: Regis Bill, MD;  Location: [facility] ENDOSCOPY;  Service: Endoscopy;  Laterality: N/A;   LOBECTOMY  01/2007   Right lung, upper lobe right side removed   TOTAL ABDOMINAL  HYSTERECTOMY     abnormal bleeding   TUBAL LIGATION  1980   Family History  Problem Relation Age of Onset   Coronary artery disease Mother        died @ 46 of MI.   Mental illness Mother    Diabetes Mother    Heart attack Father 7       MI   Cancer Father        lung - died in his mid-70's   Hypertension Father    Diabetes Father    Sudden death Brother    Mental illness Brother    Heart attack Brother        MI @ 68, died @ age 46.   Hypertension Child    Breast cancer Neg Hx    Social History   Socioeconomic History   Marital status: Widowed    Spouse name: Not on file   Number of children: Not on file   Years of education: Not on file   Highest education level: Not on file  Occupational History   Not on file  Tobacco Use   Smoking status: Never    Passive exposure: Yes   Smokeless tobacco: Never   Tobacco comments:    husband and son smoked in her home.  Vaping Use   Vaping Use: Never used  Substance and Sexual Activity   Alcohol use: Not Currently    Alcohol/week: 0.0 standard drinks of alcohol   Drug use: No   Sexual activity: Never  Other Topics Concern   Not on file  Social History Narrative   Widowed.   Disabled (colon/lung CA), retired.   Lives in [place] with her dtr, grand-dtr, and son.   Active.   Gets regular exercise, walks.   Right handed    Social Determinants of Health   Financial Resource Strain: Low Risk  (11/29/2021)   Overall Financial Resource Strain (CARDIA)    Difficulty of Paying Living Expenses: Not hard at all  Food Insecurity: No Food Insecurity (11/29/2021)   Hunger Vital Sign    Worried About Running Out of Food in the Last Year: Never true    Ran Out of Food in the Last Year: Never true  Transportation Needs: No Transportation Needs (11/29/2021)   PRAPARE - Administrator, Civil Service (Medical): No    Lack of Transportation (Non-Medical): No  Physical Activity: Not on file  Stress: No Stress Concern  Present (11/29/2021)   [employer] of Occupational Health - Occupational Stress Questionnaire    Feeling of Stress : Only a little  Social Connections: Unknown (11/29/2021)   Social Connection and Isolation Panel [NHANES]    Frequency of Communication with Friends and Family: More than three times a week    Frequency of Social Gatherings with Friends and Family: More than three times a week  Attends Religious Services: Not on file    Active Member of Clubs or Organizations: Not on file    Attends Archivist Meetings: Not on file    Marital Status: Not on file     Review of Systems  Constitutional:  Negative for appetite change and unexpected weight change.  HENT:  Negative for congestion and sinus pressure.   Respiratory:  Negative for cough, chest tightness and shortness of breath.   Cardiovascular:  Positive for leg swelling. Negative for chest pain and palpitations.  Gastrointestinal:  Negative for abdominal pain, diarrhea, nausea and vomiting.  Genitourinary:  Negative for difficulty urinating and dysuria.  Musculoskeletal:  Negative for myalgias.       Does report persistent knee pain - s/p procedure.    Skin:  Negative for color change and rash.  Neurological:  Negative for dizziness and headaches.  Psychiatric/Behavioral:  Negative for agitation and behavioral problems.        Objective:     There were no vitals taken for this visit. Wt Readings from Last 3 Encounters:  05/23/22 177 lb (80.3 kg)  05/20/22 169 lb 15.6 oz (77.1 kg)  04/27/22 171 lb (77.6 kg)    Physical Exam Vitals reviewed.  Constitutional:      General: She is not in acute distress.    Appearance: Normal appearance.  HENT:     Head: Normocephalic and atraumatic.     Right Ear: External ear normal.     Left Ear: External ear normal.  Eyes:     General: No scleral icterus.       Right eye: No discharge.        Left eye: No discharge.     Conjunctiva/sclera: Conjunctivae normal.   Neck:     Thyroid: No thyromegaly.  Cardiovascular:     Rate and Rhythm: Normal rate and regular rhythm.  Pulmonary:     Effort: No respiratory distress.     Breath sounds: Normal breath sounds. No wheezing.  Abdominal:     General: Bowel sounds are normal.     Palpations: Abdomen is soft.     Tenderness: There is no abdominal tenderness.  Musculoskeletal:     Cervical back: Neck supple. No tenderness.     Comments: Increased lower extremity swelling - bilateral.  No increased erythema.  No increased thigh swelling.    Lymphadenopathy:     Cervical: No cervical adenopathy.  Skin:    Findings: No erythema or rash.  Neurological:     Mental Status: She is alert.  Psychiatric:        Mood and Affect: Mood normal.        Behavior: Behavior normal.      Outpatient Encounter Medications as of 05/25/2022  Medication Sig   ALPRAZolam (XANAX) 0.5 MG tablet TAKE 1/2 TABLET BY MOUTH ONCE DAILY AS NEEDED   amLODipine (NORVASC) 5 MG tablet Take 1.5 tablets (7.5 mg total) by mouth daily. (5 mg + 2.5 mg qd)   aspirin EC 81 MG tablet Take 81 mg by mouth as needed.    Carbidopa-Levodopa ER (SINEMET CR) 25-100 MG tablet controlled release Week 1:  1 tablet at 7am Week 2:  1 tablet at 7am/11am Week 3:  1 tablet at 7am/11am/4pm   cephALEXin (KEFLEX) 500 MG capsule Take 1 capsule (500 mg total) by mouth 3 (three) times daily for 10 days.   citalopram (CELEXA) 10 MG tablet TAKE ONE AND ONE-HALF TABLET BY MOUTH DAILY   fluticasone-salmeterol (  ADVAIR HFA) 115-21 MCG/ACT inhaler Inhale 2 puffs into the lungs 2 (two) times daily. (Patient not taking: Reported on 04/27/2022)   furosemide (LASIX) 20 MG tablet Take 1 tablet (20 mg total) by mouth daily.   HYDROcodone-acetaminophen (NORCO/VICODIN) 5-325 MG tablet Take 1 tablet by mouth every 6 (six) hours as needed for moderate pain.   montelukast (SINGULAIR) 10 MG tablet TAKE 1 TABLET BY MOUTH AT BEDTIME   PROAIR HFA 108 (90 Base) MCG/ACT inhaler INHALE  2 PUFFS BY MOUTH INTO THE LUNGS EVERY 6 HOURS AS NEEDED FOR WHEEZING   rosuvastatin (CRESTOR) 5 MG tablet TAKE 1 TABLET BY MOUTH EVERY MONDAY, WEDNESDAY AND FRIDAY   Rotigotine (NEUPRO) 1 MG/24HR PT24 Place 1 patch (1 mg total) onto the skin daily.   traZODone (DESYREL) 50 MG tablet TAKE 2 TABLETS BY MOUTH AT BEDTIME   vitamin B-12 (CYANOCOBALAMIN) 100 MCG tablet Take 100 mcg by mouth daily.   No facility-administered encounter medications on file as of 05/25/2022.     Lab Results  Component Value Date   WBC 10.7 (H) 05/20/2022   HGB 11.9 (L) 05/20/2022   HCT 37.2 05/20/2022   PLT 254 05/20/2022   GLUCOSE 140 (H) 05/20/2022   CHOL 207 (H) 03/09/2022   TRIG 77.0 03/09/2022   HDL 72.60 03/09/2022   LDLDIRECT 163.4 12/31/2012   LDLCALC 119 (H) 03/09/2022   ALT 18 05/20/2022   AST 23 05/20/2022   NA 137 05/20/2022   K 3.3 (L) 05/20/2022   CL 107 05/20/2022   CREATININE 1.07 (H) 05/20/2022   BUN 30 (H) 05/20/2022   CO2 24 05/20/2022   TSH 1.67 03/09/2022   INR 1.0 12/12/2012   HGBA1C 5.9 03/09/2022    US Venous Img Lower Unilateral Left  Result Date: 01/30/2022 CLINICAL DATA:  Pain and swelling EXAM: Left LOWER EXTREMITY VENOUS DOPPLER ULTRASOUND TECHNIQUE: Gray-scale sonography with compression, as well as color and duplex ultrasound, were performed to evaluate the deep venous system(s) from the level of the common femoral vein through the popliteal and proximal calf veins. COMPARISON:  None Available. FINDINGS: VENOUS Normal compressibility of the common femoral, superficial femoral, and popliteal veins, as well as the visualized calf veins. Visualized portions of profunda femoral vein and great saphenous vein unremarkable. No filling defects to suggest DVT on grayscale or color Doppler imaging. Doppler waveforms show normal direction of venous flow, normal respiratory plasticity and response to augmentation. Limited views of the contralateral common femoral vein are unremarkable.  OTHER None. Limitations: none IMPRESSION: There is no evidence of deep venous thrombosis in left lower extremity. Electronically Signed   By: Ernie Avena M.D.   On: 01/30/2022 15:34       Assessment & Plan:   Problem List Items Addressed This Visit   None   Dale [place], MD

## 2022-05-26 ENCOUNTER — Encounter: Payer: Self-pay | Admitting: Occupational Therapy

## 2022-05-26 NOTE — Therapy (Signed)
OUTPATIENT OCCUPATIONAL THERAPY PARKINSON'S EVALUATION  Patient Name: Victoria Hanson MRN: [id number] DOB:08/02/1950, 71 y.o., female Today's Date: 05/26/2022  PCP: Einar Pheasant REFERRING PROVIDER: Alonza Bogus   END OF SESSION:  OT End of Session - 05/26/22 1902     Visit Number 1    Number of Visits 17    Date for OT Re-Evaluation 06/29/22    OT Start Time 0855    OT Stop Time 1020    OT Time Calculation (min) 85 min    Activity Tolerance Patient tolerated treatment well    Behavior During Therapy [facility] for tasks assessed/performed             Past Medical History:  Diagnosis Date   Anxiety    Asthma    Atypical chest pain    a. 2003 Cath: reportedly nl per pt Victoria Hanson);  b. 04/2015 [facility], neg troponin.   Colon cancer ([place]) 1999   a. 1999: Pt had partial colectomy. Did develop lung metastasis. Had right upper lobectomy in 01/2007 then had associated chemotherapy.   COPD (chronic obstructive pulmonary disease) (HCC)    DDD (degenerative disc disease), lumbosacral    Depression    Echocardiogram abnormal    a. 02/2010 Echo: EF 55-60%, mild LAE, no regional wall motion abnormalities   Elevated transaminase level    a. ? NASH;  b. 06/2015 - nl LFTs.   GERD (gastroesophageal reflux disease)    Hematuria    Holter monitor, abnormal    a. 12/2009 Holter monitoring: NSR, rare PACs and PVCs.   Hypercholesterolemia    Hyperlipidemia    Hypertension    Leaky heart valve    a. Per pt report - no evidence of valvular abnormalities on prior echoes.   Lesion of skin of Right breast 10/20/2017   Lung metastases 2008   a. S/P resection (lobectomy - right)   Morbid obesity ([place])    Nephrolithiasis    pt denies   Parkinson disease 08/2016   [name]   Past Surgical History:  Procedure Laterality Date   BREAST EXCISIONAL BIOPSY Right 10/07/2009   benign   CARDIAC CATHETERIZATION  2003   negative as per pt report   CHOLECYSTECTOMY  2000   COLON SURGERY      Colon cancer, removed part of large colon   COLONOSCOPY N/A 04/25/2015   Procedure: COLONOSCOPY;  Surgeon: Manya Silvas, MD;  Location: [place];  Service: Endoscopy;  Laterality: N/A;   COLONOSCOPY WITH PROPOFOL N/A 06/14/2017   Procedure: COLONOSCOPY WITH PROPOFOL;  Surgeon: Manya Silvas, MD;  Location[facility] ENDOSCOPY;  Service: Endoscopy;  Laterality: N/A;   COLONOSCOPY WITH PROPOFOL N/A 11/17/2019   Procedure: COLONOSCOPY WITH PROPOFOL;  Surgeon: [name], MD;  Location: [facility] ENDOSCOPY;  Service: Gastroenterology;  Laterality: N/A;   COLONOSCOPY WITH PROPOFOL N/A 03/21/2021   Procedure: COLONOSCOPY WITH PROPOFOL;  Surgeon: Lesly Rubenstein, MD;  Location: [facility] ENDOSCOPY;  Service: Endoscopy;  Laterality: N/A;   ESOPHAGOGASTRODUODENOSCOPY (EGD) WITH PROPOFOL N/A 03/21/2021   Procedure: ESOPHAGOGASTRODUODENOSCOPY (EGD) WITH PROPOFOL;  Surgeon: Lesly Rubenstein, MD;  Location: [facility] ENDOSCOPY;  Service: Endoscopy;  Laterality: N/A;   LOBECTOMY  01/2007   Right lung, upper lobe right side removed   TOTAL ABDOMINAL HYSTERECTOMY     abnormal bleeding   TUBAL LIGATION  1980   Patient Active Problem List   Diagnosis Date Noted   Hypophonia 05/23/2022   Left knee pain 05/02/2022   Parkinson disease 03/12/2022   Change in  bowel movement 02/05/2022   Lower extremity edema 01/30/2022   Cloudy urine 11/29/2021   History of total knee arthroplasty 09/13/2021   Hepatic steatosis 09/13/2021   Urinary frequency 03/26/2021   Right knee pain 01/01/2021   UTI (urinary tract infection) 11/27/2020   Vitamin D deficiency 11/05/2020   Aortic atherosclerosis ([place]) 11/05/2020   Dizziness 08/24/2020   Cardiomegaly 08/24/2020   Constipation 11/11/2019   Anemia 11/11/2019   Near syncope 02/13/2019   Fibromuscular dysplasia ([place]) 02/02/2019   Abdominal bloating 01/04/2019   Unsteady gait 12/03/2018   History of UTI 10/08/2018   Ear fullness, right 10/08/2018   B12 deficiency  03/11/2018   Goals of care, counseling/discussion 10/15/2017   Orthostatic hypotension due to Parkinson's disease ([place]) 07/31/2017   COPD exacerbation ([place]) 07/31/2017   REM behavioral disorder 05/28/2017   Elevated liver function tests 08/25/2016   Carotid artery disease ([place]) 07/19/2016   Tremor 06/01/2016   Osteoporosis 04/08/2016   Foot pain, bilateral 07/24/2015   Head pain 07/24/2015   Hyperlipidemia 06/21/2015   Other fatigue 06/21/2015   BMI 33.0-33.9,adult 06/21/2015   Disorder of kidney and ureter 03/28/2015   Hyperglycemia 03/20/2015   Swelling of lower extremity 11/20/2014   Dysuria 10/24/2014   Health care maintenance 07/17/2014   Hypokalemia 04/05/2013   TIA (transient ischemic attack) 02/09/2013   Transient cerebral ischemia 02/09/2013   Anxiety, mild 03/13/2012   Depression, recurrent ([place]) 03/13/2012   History of colon cancer 03/13/2012   Hypertension 03/13/2012   CKD (chronic kidney disease), stage III ([place]) 03/13/2012   GERD (gastroesophageal reflux disease) 03/13/2012   Acid reflux 03/13/2012   History of malignant neoplasm metastatic to lung 03/13/2012   Major depressive disorder with single episode 03/13/2012   Hypercholesterolemia 02/02/2010   SOB (shortness of breath) on exertion 02/02/2010   Pure hypercholesterolemia 02/02/2010    ONSET DATE: 05/23/2022, diagnosed with Parkinson's in 2018  REFERRING DIAG: Parkinson's disease, LSVT BIG  THERAPY DIAG:  Muscle weakness (generalized)  Other lack of coordination  Abnormal posture  Difficulty in walking, not elsewhere classified  Unsteadiness on feet  Rationale for Evaluation and Treatment: Rehabilitation  SUBJECTIVE:   SUBJECTIVE STATEMENT: Pt reports she is having difficulty in all aspects of her life.  Reports she requires increased assistance from her family with all tasks.  She has difficulty with walking.  Reports it is difficult for her family to hear and understand her with her low  voice quality.  She would like to be able to do more for herself. Pt reports she has had recent difficulty with her medication and low blood pressure, she was able to see her physician this week and has a plan for medication changes and management.  She had stopped taking her levodopa at the end of August and since that time she reports increased difficulty with mobility. Pt accompanied by: family member  PERTINENT HISTORY: Pt diagnosed with Parkinson's disease in 2018.  Pt with multiple visits to the ER this year with BP issues, UTI, weakness and changes in medication.  Pt requiring increased assistance from family with all self care and mobility.  She had a knee replacement on the left March 2023.  Tremors in bilateral hands.Pt has a history of hypotension.  PRECAUTIONS: Fall, Pt has a history of Neurogenic Orthostatic Hypotension  WEIGHT BEARING RESTRICTIONS: No  PAIN:  Are you having pain? No  FALLS: Has patient fallen in last 6 months? No  LIVING ENVIRONMENT: Lives with: lives with their family Lives in:  House/apartment Stairs: No Has following equipment at home: Walker - 2 wheeled, shower chair, bed side commode, and Grab bars  PLOF: Needs assistance with ADLs, Needs assistance with homemaking, and Needs assistance with transfers  PATIENT GOALS: Pt reports she would like to be as independent as possible, do more for herself  OBJECTIVE:   HAND DOMINANCE: Right  ADLs: Overall ADLs: Pt reports her daughter helps her with bathing and dressing Transfers/ambulation related to ADLs: Eating: Able to feed self independently Grooming: Able to complete grooming with modified independence, difficulty with standing for long periods of time UB Dressing: min to mod assist LB Dressing: mod to max assist Toileting: min to mod assist  Bathing: max assist Tub Shower transfers: mod assist, has shower chair and grab bar in shower. Equipment: Shower seat with back, Grab bars, and bed side  commode  IADLs: Shopping: Dependent Light housekeeping: Reports she tries to help wash dishes but cannot stand very long Meal Prep: Max to dependent  Community mobility: does not drive, family provides transportation, ambulates with rolling walker  Medication management: TBA Financial management: TBA Handwriting: 25% legible, demonstrates severe micrographia  MOBILITY STATUS: Needs Assist: assist with sit to stand transfer, ambulates with RW, difficulty with turns, and start hesitation, freezing of gait noted.  POSTURE COMMENTS:  rounded shoulders and forward head  ACTIVITY TOLERANCE: Activity tolerance: Fair, patient requires frequent rest breaks during evaluation and during mobility tasks  FUNCTIONAL OUTCOME MEASURES: FOTO 6 min walk test 350 feet 5 times sit to stand unable to perform sit to stand without use of arms BERG balance: TBA next session  COORDINATION: Finger Nose Finger test: impaired  9 Hole Peg test: Right: 50 sec; Left: 56 sec  UE/LE ROM:   Pt with limited ROM of bilateral UEs, LE:  right knee extension full, left 30 degrees of knee extension and limited dorsiflexion of knee  UE MMT:    3/5 overall strength in UE, LE:  right 3/5, left 2-/5  MUSCLE TONE: Increased muscle tone L greater than R, UE greater than LE.    SENSATION: WFL  COGNITION: Overall cognitive status: Within functional limits for tasks assessed  OBSERVATIONS: Bradykinesia   TODAY'S TREATMENT:                                                                                                                              DATE: 05/25/2022   PATIENT EDUCATION: Education details: LSVT BIG program, POC, goals, role of OT Person educated: Patient Education method: Explanation Education comprehension: verbalized understanding  HOME EXERCISE PROGRAM: Will initiate home program next session with implementation of LSVT BIG maximal daily exercises  GOALS: Goals reviewed with patient?  Yes  SHORT TERM GOALS: Target date: 06/08/2022  Pt will demonstrate sit to stand from chair with modified independence, using arm rest as needed to push up from chair.  Baseline: min to mod assist Goal status: INITIAL   LONG TERM GOALS: Target date: 06/29/2022  Patient will improve gait speed and endurance to be able to ambulate 500 feet in 6 minutes to negotiate around the home and community safely in 4 weeks.  Baseline: Eval 350 feet  Goal status: INITIAL  2.  Patient will complete HEP for maximal daily exercises with min to mod assist in 4 weeks.  Baseline: Eval-no current program Goal status: INITIAL  3.  Patient will transfer from to sit to stand safely and independently from a variety of chairs/surfaces in 4 weeks.  Baseline: min to mod assist for sit to stand Goal status: INITIAL  4.  Pt will complete dressing with min assist  Baseline: mod to max assist at eval  Goal status: INITIAL  5.  Pt will complete bathing with min assist  Baseline: max assist Goal status: INITIAL  6.  Pt will complete standing at the sink for up to 5 mins to wash dishes with SBA. Baseline: Difficulty standing to wash dishes, can stand 1-3 mins Goal status: INITIAL ASSESSMENT:  CLINICAL IMPRESSION: Patient is a 71 yo female diagnosed with Parkinson's disease and referred by her physician for LSVT BIG program.  Patient presents with decreased step length with gait patterns, decreased balance, decreased coordination, decreased muscle strength, decreased transfers, decreased functional mobility, decreased ability to perform self care and basic ADL and IADL tasks which affect her ability to perform necessary daily tasks.  The patient is judged to be an excellent candidate for the LSVT BIG program.  She would benefit from and was referred for the LSVT BIG program which is an intensive program designed specifically for Parkinson's patients with a focus on increasing amplitude and speed of movements,  improving self-care and daily tasks and providing patients with daily exercises to improve overall function.  It is recommended that the patient receive the LSVT BIG program, which is comprised of 16 intensive sessions, (4 times a week for 4 weeks, one hour sessions).  Prognosis for improvement is good based on patient's motivation and family support.  LSVT BIG has been documented in the literature as efficacious for individuals with Parkinson's disease.    PERFORMANCE DEFICITS: in functional skills including ADLs, IADLs, coordination, dexterity, ROM, strength, flexibility, Fine motor control, Gross motor control, mobility, balance, endurance, decreased knowledge of use of DME, and UE functional use, cognitive skills including attention and memory, and psychosocial skills including environmental adaptation, habits, and routines and behaviors.   IMPAIRMENTS: are limiting patient from ADLs, IADLs, leisure, and social participation.   COMORBIDITIES:  has co-morbidities such as orthostatic hypotension, CAD, syncope, knee pain, unsteady gait, fall risk, COPD, osteoporosis,   that affects occupational performance. Patient will benefit from skilled OT to address above impairments and improve overall function.  MODIFICATION OR ASSISTANCE TO COMPLETE EVALUATION: Min-Moderate modification of tasks or assist with assess necessary to complete an evaluation.  OT OCCUPATIONAL PROFILE AND HISTORY: Detailed assessment: Review of records and additional review of physical, cognitive, psychosocial history related to current functional performance.  CLINICAL DECISION MAKING: High - multiple treatment options, significant modification of task necessary  REHAB POTENTIAL: Good  EVALUATION COMPLEXITY: High    PLAN:  OT FREQUENCY: 4x/week  OT DURATION: 4 weeks  PLANNED INTERVENTIONS: self care/ADL training, therapeutic exercise, therapeutic activity, neuromuscular re-education, manual therapy, passive range of  motion, gait training, balance training, stair training, and functional mobility training  RECOMMENDED OTHER SERVICES: LSVT LOUD with SLP  CONSULTED AND AGREED WITH PLAN OF CARE: Patient and family member/caregiver  PLAN FOR NEXT SESSION: Will educate and  perform maximal daily exercises, BERG balance assessment   Jorrell Kuster T Toribio Seiber, OTR/L, CLT  Erandy Mceachern, OT 05/26/2022, 7:04 PM

## 2022-05-28 ENCOUNTER — Encounter: Payer: Medicare Other | Admitting: Speech Pathology

## 2022-05-28 ENCOUNTER — Encounter: Payer: Medicare Other | Admitting: Occupational Therapy

## 2022-05-28 ENCOUNTER — Ambulatory Visit: Payer: Medicare Other | Admitting: Occupational Therapy

## 2022-05-28 DIAGNOSIS — R293 Abnormal posture: Secondary | ICD-10-CM

## 2022-05-28 DIAGNOSIS — M6281 Muscle weakness (generalized): Secondary | ICD-10-CM | POA: Diagnosis not present

## 2022-05-28 DIAGNOSIS — R262 Difficulty in walking, not elsewhere classified: Secondary | ICD-10-CM | POA: Diagnosis not present

## 2022-05-28 DIAGNOSIS — R278 Other lack of coordination: Secondary | ICD-10-CM

## 2022-05-28 DIAGNOSIS — R2681 Unsteadiness on feet: Secondary | ICD-10-CM | POA: Diagnosis not present

## 2022-05-28 NOTE — Progress Notes (Deleted)
Cardiology Office Note    Date:  05/28/2022   ID:  [name], [name] 1951/01/05, MRN [id number]  PCP:  Dale Parkdale, MD  Cardiologist: Lance Muss, MD  --> Requests to follow-up in [facility]  No chief complaint on file.   History of Present Illness:    Victoria Hanson is a 71 y.o. female with past medical history of chest pain (normal catheterization in 2003, coronary calcium score of 0 by coronary CT in 11/2018), palpitations (PAC's and PVC's by prior monitor), HTN, HLD, Parkinson's disease, COPD  / lung cancer RULobectomy in 2008 and history of colon cancer post colectomy in 2000  who presents as new patient to me today   She was last examined by Dr. Eldridge Dace in 01/2021 and reported intermittent dyspnea and lower extremity edema but denied any chest pain or palpitations. She was continued on her current medical therapy. TTE done 02/23/21 showed EF 60-65% normal diastolic parameters normal strain -19.4 , no LVH mild/mod MR and no pulmonary HTN with normal RV  In the interim, she called the office on 11/20/2021 reporting elevated BP of 230/100 as she had previously been on Amlodipine but this was discontinued due to hypotension. She presented to the ED later that evening as SBP had been greater than 200 but denied any associated symptoms at that time. BP was at 152/73 while in the ED. Lab work was reassuring and she was discharged home with no changes to her medication regimen.  In talking with the patient and her daughter today, she reports she was having issues with hypotension last month and had been taking Lasix daily along with Amlodipine 2.5 mg daily.  She was encouraged to stop both by her PCP  BP was normal when seen by PA 11/24/21 at 132/72 mmhg Feels she needs to be on diuretic with history of pleural effusions    No recent exertional chest pain or progressive dyspnea on exertion.  She does use supplemental oxygen at home for COPD. CXR 05/20/22 NAD   Kept on  amlodipine 7.5 mg and PRN lasix  Sees Dr Tat for Parkinson's Issues with lower BP on Sinemet and changed to CR for slower release She also mentions neurogenic orthostatic hypotension and has midodrine if needed Thinks her BP lower and more dyspnea on sinemet   ***  Past Medical History:  Diagnosis Date   Anxiety    Asthma    Atypical chest pain    a. 2003 Cath: reportedly nl per pt [name]);  b. 04/2015 [facility] Admission, neg troponin.   Colon cancer (HCC) 1999   a. 1999: Pt had partial colectomy. Did develop lung metastasis. Had right upper lobectomy in 01/2007 then had associated chemotherapy.   COPD (chronic obstructive pulmonary disease) (HCC)    DDD (degenerative disc disease), lumbosacral    Depression    Echocardiogram abnormal    a. 02/2010 Echo: EF 55-60%, mild LAE, no regional wall motion abnormalities   Elevated transaminase level    a. ? NASH;  b. 06/2015 - nl LFTs.   GERD (gastroesophageal reflux disease)    Hematuria    Holter monitor, abnormal    a. 12/2009 Holter monitoring: NSR, rare PACs and PVCs.   Hypercholesterolemia    Hyperlipidemia    Hypertension    Leaky heart valve    a. Per pt report - no evidence of valvular abnormalities on prior echoes.   Lesion of skin of Right breast 10/20/2017   Lung metastases 2008  a. S/P resection (lobectomy - right)   Morbid obesity (HCC)    Nephrolithiasis    pt denies   Parkinson disease 08/2016   [name]    Past Surgical History:  Procedure Laterality Date   BREAST EXCISIONAL BIOPSY Right 10/07/2009   benign   CARDIAC CATHETERIZATION  2003   negative as per pt report   CHOLECYSTECTOMY  2000   COLON SURGERY     Colon cancer, removed part of large colon   COLONOSCOPY N/A 04/25/2015   Procedure: COLONOSCOPY;  Surgeon: Scot Jun, MD;  Location: [facility] ENDOSCOPY;  Service: Endoscopy;  Laterality: N/A;   COLONOSCOPY WITH PROPOFOL N/A 06/14/2017   Procedure: COLONOSCOPY WITH PROPOFOL;  Surgeon: Scot Jun,  MD;  Location: [facility] ENDOSCOPY;  Service: Endoscopy;  Laterality: N/A;   COLONOSCOPY WITH PROPOFOL N/A 11/17/2019   Procedure: COLONOSCOPY WITH PROPOFOL;  Surgeon: Wyline Mood, MD;  Location: [facility] ENDOSCOPY;  Service: Gastroenterology;  Laterality: N/A;   COLONOSCOPY WITH PROPOFOL N/A 03/21/2021   Procedure: COLONOSCOPY WITH PROPOFOL;  Surgeon: Regis Bill, MD;  Location: [facility] ENDOSCOPY;  Service: Endoscopy;  Laterality: N/A;   ESOPHAGOGASTRODUODENOSCOPY (EGD) WITH PROPOFOL N/A 03/21/2021   Procedure: ESOPHAGOGASTRODUODENOSCOPY (EGD) WITH PROPOFOL;  Surgeon: Regis Bill, MD;  Location: [facility] ENDOSCOPY;  Service: Endoscopy;  Laterality: N/A;   LOBECTOMY  01/2007   Right lung, upper lobe right side removed   TOTAL ABDOMINAL HYSTERECTOMY     abnormal bleeding   TUBAL LIGATION  1980    Current Medications: Outpatient Medications Prior to Visit  Medication Sig Dispense Refill   ALPRAZolam (XANAX) 0.5 MG tablet TAKE 1/2 TABLET BY MOUTH ONCE DAILY AS NEEDED 30 tablet 0   amLODipine (NORVASC) 5 MG tablet Take 1.5 tablets (7.5 mg total) by mouth daily. (5 mg + 2.5 mg qd) 30 tablet 3   aspirin EC 81 MG tablet Take 81 mg by mouth as needed.      Carbidopa-Levodopa ER (SINEMET CR) 25-100 MG tablet controlled release Week 1:  1 tablet at 7am Week 2:  1 tablet at 7am/11am Week 3:  1 tablet at 7am/11am/4pm 270 tablet 0   cephALEXin (KEFLEX) 500 MG capsule Take 1 capsule (500 mg total) by mouth 3 (three) times daily for 10 days. 21 capsule 0   citalopram (CELEXA) 10 MG tablet TAKE ONE AND ONE-HALF TABLET BY MOUTH DAILY 135 tablet 1   fluticasone-salmeterol (ADVAIR HFA) 115-21 MCG/ACT inhaler Inhale 2 puffs into the lungs 2 (two) times daily. (Patient not taking: Reported on 04/27/2022) 1 Inhaler 4   furosemide (LASIX) 20 MG tablet Take 1 tablet (20 mg total) by mouth daily. 30 tablet 11   HYDROcodone-acetaminophen (NORCO/VICODIN) 5-325 MG tablet Take 1 tablet by mouth every 6 (six) hours as  needed for moderate pain.     montelukast (SINGULAIR) 10 MG tablet TAKE 1 TABLET BY MOUTH AT BEDTIME 30 tablet 2   PROAIR HFA 108 (90 Base) MCG/ACT inhaler INHALE 2 PUFFS BY MOUTH INTO THE LUNGS EVERY 6 HOURS AS NEEDED FOR WHEEZING 8.5 g 2   rosuvastatin (CRESTOR) 5 MG tablet TAKE 1 TABLET BY MOUTH EVERY MONDAY, WEDNESDAY AND FRIDAY 39 tablet 1   Rotigotine (NEUPRO) 1 MG/24HR PT24 Place 1 patch (1 mg total) onto the skin daily. 30 patch 1   traZODone (DESYREL) 50 MG tablet TAKE 2 TABLETS BY MOUTH AT BEDTIME 180 tablet 1   vitamin B-12 (CYANOCOBALAMIN) 100 MCG tablet Take 100 mcg by mouth daily.     No facility-administered  medications prior to visit.     Allergies:   Macrobid [nitrofurantoin macrocrystal]; Antihistamines, diphenhydramine-type; Aspirin; Ceftin [cefuroxime axetil]; Celecoxib; Cymbalta [duloxetine hcl]; Diphenhydramine; Escitalopram; Lexapro [escitalopram oxalate]; Metronidazole; Nitrofurantoin; Zoloft [sertraline hcl]; and Ciprofloxacin   Social History   Socioeconomic History   Marital status: Widowed    Spouse name: Not on file   Number of children: Not on file   Years of education: Not on file   Highest education level: Not on file  Occupational History   Not on file  Tobacco Use   Smoking status: Never    Passive exposure: Yes   Smokeless tobacco: Never   Tobacco comments:    husband and son smoked in her home.  Vaping Use   Vaping Use: Never used  Substance and Sexual Activity   Alcohol use: Not Currently    Alcohol/week: 0.0 standard drinks of alcohol   Drug use: No   Sexual activity: Never  Other Topics Concern   Not on file  Social History Narrative   Widowed.   Disabled (colon/lung CA), retired.   Lives in [place] with her dtr, grand-dtr, and son.   Active.   Gets regular exercise, walks.   Right handed    Social Determinants of Health   Financial Resource Strain: Low Risk  (11/29/2021)   Overall Financial Resource Strain (CARDIA)     Difficulty of Paying Living Expenses: Not hard at all  Food Insecurity: No Food Insecurity (11/29/2021)   Hunger Vital Sign    Worried About Running Out of Food in the Last Year: Never true    Ran Out of Food in the Last Year: Never true  Transportation Needs: No Transportation Needs (11/29/2021)   PRAPARE - Administrator, Civil Service (Medical): No    Lack of Transportation (Non-Medical): No  Physical Activity: Not on file  Stress: No Stress Concern Present (11/29/2021)   [employer] of Occupational Health - Occupational Stress Questionnaire    Feeling of Stress : Only a little  Social Connections: Unknown (11/29/2021)   Social Connection and Isolation Panel [NHANES]    Frequency of Communication with Friends and Family: More than three times a week    Frequency of Social Gatherings with Friends and Family: More than three times a week    Attends Religious Services: Not on Marketing executive or Organizations: Not on file    Attends Banker Meetings: Not on file    Marital Status: Not on file     Family History:  The patient's family history includes Cancer in her father; Coronary artery disease in her mother; Diabetes in her father and mother; Heart attack in her brother; Heart attack (age of onset: 22) in her father; Hypertension in her child and father; Mental illness in her brother and mother; Sudden death in her brother.   Review of Systems:    Please see the history of present illness.     All other systems reviewed and are otherwise negative except as noted above.   Physical Exam:    VS:  There were no vitals taken for this visit.    Chronically ill female Parkinson's *** No murmur  Lungs post RuLobectomy Abdomen post colectomy and hyterectomy benign LE *** edema   Wt Readings from Last 3 Encounters:  05/23/22 177 lb (80.3 kg)  05/20/22 169 lb 15.6 oz (77.1 kg)  04/27/22 171 lb (77.6 kg)    Studies/Labs Reviewed:    EKG:  SR rate 72 inferior lateral ST changes   Recent Labs: 03/09/2022: TSH 1.67 05/20/2022: ALT 18; B Natriuretic Peptide 320.6; BUN 30; Creatinine, Ser 1.07; Hemoglobin 11.9; Platelets 254; Potassium 3.3; Sodium 137   Lipid Panel    Component Value Date/Time   CHOL 207 (H) 03/09/2022 1006   TRIG 77.0 03/09/2022 1006   HDL 72.60 03/09/2022 1006   CHOLHDL 3 03/09/2022 1006   VLDL 15.4 03/09/2022 1006   LDLCALC 119 (H) 03/09/2022 1006   LDLDIRECT 163.4 12/31/2012 1022    Additional studies/ records that were reviewed today include:   Coronary CT: 11/2018 IMPRESSION: 1. Coronary calcium score of 0. This was 0 percentile for age and sex matched control.   2. Normal coronary origin with right dominance.   3. No evidence for CAD.  Consider non-cardiac causes for chest pain.  Echo: 02/2021 IMPRESSIONS     1. Left ventricular ejection fraction, by estimation, is 60 to 65%. The  left ventricle has normal function. The left ventricle has no regional  wall motion abnormalities. Left ventricular diastolic parameters were  normal. The average left ventricular  global longitudinal strain is -19.4 %. The global longitudinal strain is  normal.   2. Right ventricular systolic function is normal. The right ventricular  size is normal. There is mildly elevated pulmonary artery systolic  pressure.   3. Left atrial size was mild to moderately dilated.   4. The mitral valve is normal in structure. Mild to moderate mitral valve  regurgitation. No evidence of mitral stenosis.   5. The aortic valve is tricuspid. Aortic valve regurgitation is not  visualized. No aortic stenosis is present.   6. The inferior vena cava is normal in size with greater than 50%  respiratory variability, suggesting right atrial pressure of 3 mmHg.   Assessment:    No diagnosis found.    Plan:   In order of problems listed above:  1. HTN - complicated by neurogenic orthostasis in setting of  Parkinson's currently on norvasc ***  2. Lower Extremity Edema - ? From norvasc - not cardiac with normal diastolic/systolic function on TTE - PRN lasix   3. History of Chest Pain - She had normal coronary arteries by prior catheterization in 2003 and did have a calcium score of 0 by Coronary CT in 11/2018. She has baseline dyspnea on exertion in the setting of COPD but no recent chest pain. Continue ASA 81 mg daily and Crestor 5 mg MWF.   4. HLD - FLP in 10/2021 showed LDL was down to 72. She has been intolerant to higher intensity statin therapy and remains on Crestor 5 mg MWF.   5. COPD:  with history of lung cancer and Rulobectomy CXR December ok f/u with pulmonary ***  6. Parkinson's:  F/U Dr Arbutus Leas.  Trying CR sinemet has midodrine if needed I think her labile BP and neurogenic orthostasis certainly related to this   F/U cardiology in a year   Medication Adjustments/Labs and Tests Ordered: Current medicines are reviewed at length with the patient today.  Concerns regarding medicines are outlined above.  Medication changes, Labs and Tests ordered today are listed in the Patient Instructions below. There are no Patient Instructions on file for this visit.   Signed, Charlton Haws, MD  05/28/2022 7:38 PM    [facility] [address] [place], [place] [address] Phone: [phone] Fax: [phone]

## 2022-05-29 ENCOUNTER — Ambulatory Visit: Payer: Medicare Other | Admitting: Occupational Therapy

## 2022-05-29 DIAGNOSIS — R262 Difficulty in walking, not elsewhere classified: Secondary | ICD-10-CM | POA: Diagnosis not present

## 2022-05-29 DIAGNOSIS — M6281 Muscle weakness (generalized): Secondary | ICD-10-CM

## 2022-05-29 DIAGNOSIS — R2681 Unsteadiness on feet: Secondary | ICD-10-CM | POA: Diagnosis not present

## 2022-05-29 DIAGNOSIS — R293 Abnormal posture: Secondary | ICD-10-CM | POA: Diagnosis not present

## 2022-05-29 DIAGNOSIS — R278 Other lack of coordination: Secondary | ICD-10-CM | POA: Diagnosis not present

## 2022-05-30 ENCOUNTER — Ambulatory Visit: Payer: Medicare Other | Admitting: Occupational Therapy

## 2022-05-30 DIAGNOSIS — M6281 Muscle weakness (generalized): Secondary | ICD-10-CM

## 2022-05-30 DIAGNOSIS — R262 Difficulty in walking, not elsewhere classified: Secondary | ICD-10-CM | POA: Diagnosis not present

## 2022-05-30 DIAGNOSIS — R2681 Unsteadiness on feet: Secondary | ICD-10-CM

## 2022-05-30 DIAGNOSIS — R293 Abnormal posture: Secondary | ICD-10-CM | POA: Diagnosis not present

## 2022-05-30 DIAGNOSIS — R278 Other lack of coordination: Secondary | ICD-10-CM | POA: Diagnosis not present

## 2022-05-31 ENCOUNTER — Ambulatory Visit: Payer: Medicare Other | Admitting: Occupational Therapy

## 2022-06-05 ENCOUNTER — Ambulatory Visit: Payer: Medicare Other | Admitting: Occupational Therapy

## 2022-06-05 DIAGNOSIS — R278 Other lack of coordination: Secondary | ICD-10-CM | POA: Diagnosis not present

## 2022-06-05 DIAGNOSIS — M6281 Muscle weakness (generalized): Secondary | ICD-10-CM | POA: Diagnosis not present

## 2022-06-05 DIAGNOSIS — R2681 Unsteadiness on feet: Secondary | ICD-10-CM | POA: Diagnosis not present

## 2022-06-05 DIAGNOSIS — R293 Abnormal posture: Secondary | ICD-10-CM | POA: Diagnosis not present

## 2022-06-05 DIAGNOSIS — R262 Difficulty in walking, not elsewhere classified: Secondary | ICD-10-CM

## 2022-06-06 ENCOUNTER — Ambulatory Visit: Payer: Medicare Other | Admitting: Occupational Therapy

## 2022-06-06 ENCOUNTER — Encounter: Payer: Self-pay | Admitting: Occupational Therapy

## 2022-06-06 ENCOUNTER — Ambulatory Visit: Payer: Medicare Other | Admitting: Cardiovascular Disease

## 2022-06-06 DIAGNOSIS — R2681 Unsteadiness on feet: Secondary | ICD-10-CM | POA: Diagnosis not present

## 2022-06-06 DIAGNOSIS — M6281 Muscle weakness (generalized): Secondary | ICD-10-CM

## 2022-06-06 DIAGNOSIS — R293 Abnormal posture: Secondary | ICD-10-CM | POA: Diagnosis not present

## 2022-06-06 DIAGNOSIS — R278 Other lack of coordination: Secondary | ICD-10-CM | POA: Diagnosis not present

## 2022-06-06 DIAGNOSIS — R262 Difficulty in walking, not elsewhere classified: Secondary | ICD-10-CM | POA: Diagnosis not present

## 2022-06-06 NOTE — Therapy (Addendum)
OUTPATIENT OCCUPATIONAL THERAPY [facility]REATMENT  Patient Name: Victoria Hanson MRN: [id number] DOB:06-05-1951, 71 y.o., female Today's Date: 06/05/2022  PCP: Einar Pheasant REFERRING PROVIDER: Alonza Bogus   END OF SESSION:  OT End of Session - 06/06/22 1818     Visit Number 5    Number of Visits 17    Date for OT Re-Evaluation 06/29/22    OT Start Time 1000    OT Stop Time 1100    OT Time Calculation (min) 60 min    Activity Tolerance Patient tolerated treatment well    Behavior During Therapy [facility] for tasks assessed/performed             Past Medical History:  Diagnosis Date   Anxiety    Asthma    Atypical chest pain    a. 2003 Cath: reportedly nl per pt [name]);  b. 04/2015 [place] Admission, neg troponin.   Colon cancer ([place]) 1999   a. 1999: Pt had partial colectomy. Did develop lung metastasis. Had right upper lobectomy in 01/2007 then had associated chemotherapy.   COPD (chronic obstructive pulmonary disease) (HCC)    DDD (degenerative disc disease), lumbosacral    Depression    Echocardiogram abnormal    a. 02/2010 Echo: EF 55-60%, mild LAE, no regional wall motion abnormalities   Elevated transaminase level    a. ? NASH;  b. 06/2015 - nl LFTs.   GERD (gastroesophageal reflux disease)    Hematuria    Holter monitor, abnormal    a. 12/2009 Holter monitoring: NSR, rare PACs and PVCs.   Hypercholesterolemia    Hyperlipidemia    Hypertension    Leaky heart valve    a. Per pt report - no evidence of valvular abnormalities on prior echoes.   Lesion of skin of Right breast 10/20/2017   Lung metastases 2008   a. S/P resection (lobectomy - right)   Morbid obesity ([place])    Nephrolithiasis    pt denies   Parkinson disease 08/2016   [name]   Past Surgical History:  Procedure Laterality Date   BREAST EXCISIONAL BIOPSY Right 10/07/2009   benign   CARDIAC CATHETERIZATION  2003   negative as per pt report   CHOLECYSTECTOMY  2000   COLON SURGERY      Colon cancer, removed part of large colon   COLONOSCOPY N/A 04/25/2015   Procedure: COLONOSCOPY;  Surgeon: Manya Silvas, MD;  Location: [place];  Service: Endoscopy;  Laterality: N/A;   COLONOSCOPY WITH PROPOFOL N/A 06/14/2017   Procedure: COLONOSCOPY WITH PROPOFOL;  Surgeon: Manya Silvas, MD;  Location: [facility] ENDOSCOPY;  Service: Endoscopy;  Laterality: N/A;   COLONOSCOPY WITH PROPOFOL N/A 11/17/2019   Procedure: COLONOSCOPY WITH PROPOFOL;  Surgeon: [name], MD;  Location: [facility] ENDOSCOPY;  Service: Gastroenterology;  Laterality: N/A;   COLONOSCOPY WITH PROPOFOL N/A 03/21/2021   Procedure: COLONOSCOPY WITH PROPOFOL;  Surgeon: Lesly Rubenstein, MD;  Location: [facility] ENDOSCOPY;  Service: Endoscopy;  Laterality: N/A;   ESOPHAGOGASTRODUODENOSCOPY (EGD) WITH PROPOFOL N/A 03/21/2021   Procedure: ESOPHAGOGASTRODUODENOSCOPY (EGD) WITH PROPOFOL;  Surgeon: Lesly Rubenstein, MD;  Location: [facility] ENDOSCOPY;  Service: Endoscopy;  Laterality: N/A;   LOBECTOMY  01/2007   Right lung, upper lobe right side removed   TOTAL ABDOMINAL HYSTERECTOMY     abnormal bleeding   TUBAL LIGATION  1980   Patient Active Problem List   Diagnosis Date Noted   Hypophonia 05/23/2022   Left knee pain 05/02/2022   Parkinson disease 03/12/2022   Change in  bowel movement 02/05/2022   Lower extremity edema 01/30/2022   Cloudy urine 11/29/2021   History of total knee arthroplasty 09/13/2021   Hepatic steatosis 09/13/2021   Urinary frequency 03/26/2021   Right knee pain 01/01/2021   UTI (urinary tract infection) 11/27/2020   Vitamin D deficiency 11/05/2020   Aortic atherosclerosis ([place]) 11/05/2020   Dizziness 08/24/2020   Cardiomegaly 08/24/2020   Constipation 11/11/2019   Anemia 11/11/2019   Near syncope 02/13/2019   Fibromuscular dysplasia ([place]) 02/02/2019   Abdominal bloating 01/04/2019   Unsteady gait 12/03/2018   History of UTI 10/08/2018   Ear fullness, right 10/08/2018   B12 deficiency  03/11/2018   Goals of care, counseling/discussion 10/15/2017   Orthostatic hypotension due to Parkinson's disease ([place]) 07/31/2017   COPD exacerbation ([place]) 07/31/2017   REM behavioral disorder 05/28/2017   Elevated liver function tests 08/25/2016   Carotid artery disease ([place]) 07/19/2016   Tremor 06/01/2016   Osteoporosis 04/08/2016   Foot pain, bilateral 07/24/2015   Head pain 07/24/2015   Hyperlipidemia 06/21/2015   Other fatigue 06/21/2015   BMI 33.0-33.9,adult 06/21/2015   Disorder of kidney and ureter 03/28/2015   Hyperglycemia 03/20/2015   Swelling of lower extremity 11/20/2014   Dysuria 10/24/2014   Health care maintenance 07/17/2014   Hypokalemia 04/05/2013   TIA (transient ischemic attack) 02/09/2013   Transient cerebral ischemia 02/09/2013   Anxiety, mild 03/13/2012   Depression, recurrent ([place]) 03/13/2012   History of colon cancer 03/13/2012   Hypertension 03/13/2012   CKD (chronic kidney disease), stage III ([place]) 03/13/2012   GERD (gastroesophageal reflux disease) 03/13/2012   Acid reflux 03/13/2012   History of malignant neoplasm metastatic to lung 03/13/2012   Major depressive disorder with single episode 03/13/2012   Hypercholesterolemia 02/02/2010   SOB (shortness of breath) on exertion 02/02/2010   Pure hypercholesterolemia 02/02/2010    ONSET DATE: 05/23/2022, diagnosed with Parkinson's in 2018  REFERRING DIAG: Parkinson's disease, LSVT BIG  THERAPY DIAG:  Muscle weakness (generalized)  Unsteadiness on feet  Other lack of coordination  Difficulty in walking, not elsewhere classified  Abnormal posture  Rationale for Evaluation and Treatment: Rehabilitation  SUBJECTIVE:   SUBJECTIVE STATEMENT:  Pt accompanied by: family member  PERTINENT HISTORY: Pt diagnosed with Parkinson's disease in 2018.  Pt with multiple visits to the ER this year with BP issues, UTI, weakness and changes in medication.  Pt requiring increased assistance from family  with all self care and mobility.  She had a knee replacement on the left March 2023.  Tremors in bilateral hands.Pt has a history of hypotension.  PRECAUTIONS: Fall, Pt has a history of Neurogenic Orthostatic Hypotension  WEIGHT BEARING RESTRICTIONS: No  PAIN:  Are you having pain? No  FALLS: Has patient fallen in last 6 months? No  LIVING ENVIRONMENT: Lives with: lives with their family Lives in: House/apartment Stairs: No Has following equipment at home: Environmental consultant - 2 wheeled, shower chair, bed side commode, and Grab bars  PLOF: Needs assistance with ADLs, Needs assistance with homemaking, and Needs assistance with transfers  PATIENT GOALS: Pt reports she would like to be as independent as possible, do more for herself  OBJECTIVE:   HAND DOMINANCE: Right  ADLs: Overall ADLs: Pt reports her daughter helps her with bathing and dressing Transfers/ambulation related to ADLs: Eating: Able to feed self independently Grooming: Able to complete grooming with modified independence, difficulty with standing for long periods of time UB Dressing: min to mod assist LB Dressing: mod to max assist Toileting:  min to mod assist  Bathing: max assist Tub Shower transfers: mod assist, has shower chair and grab bar in shower. Equipment: Shower seat with back, Grab bars, and bed side commode  IADLs: Shopping: Dependent Light housekeeping: Reports she tries to help wash dishes but cannot stand very long Meal Prep: Max to dependent  Community mobility: does not drive, family provides transportation, ambulates with rolling walker  Medication management: TBA Financial management: TBA Handwriting: 25% legible, demonstrates severe micrographia  MOBILITY STATUS: Needs Assist: assist with sit to stand transfer, ambulates with RW, difficulty with turns, and start hesitation, freezing of gait noted.  POSTURE COMMENTS:  rounded shoulders and forward head  ACTIVITY TOLERANCE: Activity tolerance:  Fair, patient requires frequent rest breaks during evaluation and during mobility tasks  FUNCTIONAL OUTCOME MEASURES: FOTO 6 min walk test 350 feet 5 times sit to stand unable to perform sit to stand without use of arms BERG balance: TBA next session  COORDINATION: Finger Nose Finger test: impaired  9 Hole Peg test: Right: 50 sec; Left: 56 sec  UE/LE ROM:   Pt with limited ROM of bilateral UEs, LE:  right knee extension full, left 30 degrees of knee extension and limited dorsiflexion of knee  UE MMT:    3/5 overall strength in UE, LE:  right 3/5, left 2-/5  MUSCLE TONE: Increased muscle tone L greater than R, UE greater than LE.    SENSATION: WFL  COGNITION: Overall cognitive status: Within functional limits for tasks assessed  OBSERVATIONS: Bradykinesia   TODAY'S TREATMENT:                                                                                                                              DATE: 06/05/2022  Neuromuscular Reeducation:  Patient seen for initial instruction of LSVT BIG exercises: LSVT Daily Session Maximal Daily Exercises: Sustained movements are designed to rescale the amplitude of movement output for generalization to daily functional activities. Performed as follows for 1 set of 10 repetitions each: Multi directional sustained movements- 1) Floor to ceiling, 2) Side to side. Multi directional Repetitive movements performed in standing and are designed to provide retraining effort needed for sustained muscle activation in tasks Performed as follows: 3) Step and reach forward, 4) Step and Reach Backwards, 5) Step and reach sideways, 6) Rock and reach forward/backward, 7) Rock and reach sideways. Sit to stand from mat table on lowest setting with cues for weight shift, technique and min to mod assist for 10 reps for 1 set.    All exercises were performed in an ADAPTED version of the LSVT BIG protocol for maximal daily exercises.  The adapted version utilizes a  chair to assist with balance.  Pt requiring min assist for exercises in standing for transitional movements and maintaining balance.  Pt requires therapist demo, verbal cues and tactile cues for proper form and technique but is becoming more familiar with exercises each session.  Posture exercises in sitting  and standing this date, cues for shoulder retraction and promotion of upright posture.    Functional mobility: Pt seen for mobility on even surface, utilizing rolling walker.  Cues for upright posture and to increase amplitude of steps with forward motion.  Therapist providing min guard.  Pt was able to ambulate down to Rehab department this date rather than using transport chair.  During session she ambulated 1 trial of 115 feet, 2nd trial of 150 feet, pt required 1 rest break seated during task.   Car transfer with min assist and cues managing lower extremities to get into vehicle.  Added functional hand exercises this date: 7  hand exercises to help promote handwriting, dexterity and flexibility. 1)  Fisting with arm then arm extension with fingers extended with BIG hand movements 2) oppositional movements of the thumb to each digit 3) wrist flex/ext with elbows extended 4) supination/pronation 5) tendon gliding with hook fist and then finger extension 6) tendon gliding with tabletop movement of fingers with MP flexion, PIP and DIP extension 7) MP flexion, PIP flexion, DIP extension Performed with therapist demo and moderate cues for form    PATIENT EDUCATION: Education details: LSVT BIG program, POC, goals, role of OT Person educated: Patient Education method: Explanation Education comprehension: verbalized understanding  HOME EXERCISE PROGRAM: Will initiate home program next session with implementation of LSVT BIG maximal daily exercises  GOALS: Goals reviewed with patient? Yes  SHORT TERM GOALS: Target date: 06/08/2022  Pt will demonstrate sit to stand from chair with  modified independence, using arm rest as needed to push up from chair.  Baseline: min to mod assist Goal status: INITIAL   LONG TERM GOALS: Target date: 06/29/2022  Patient will improve gait speed and endurance to be able to ambulate 500 feet in 6 minutes to negotiate around the home and community safely in 4 weeks.  Baseline: Eval 350 feet  Goal status: INITIAL  2.  Patient will complete HEP for maximal daily exercises with min to mod assist in 4 weeks.  Baseline: Eval-no current program Goal status: INITIAL  3.  Patient will transfer from to sit to stand safely and independently from a variety of chairs/surfaces in 4 weeks.  Baseline: min to mod assist for sit to stand Goal status: INITIAL  4.  Pt will complete dressing with min assist  Baseline: mod to max assist at eval  Goal status: INITIAL  5.  Pt will complete bathing with min assist  Baseline: max assist Goal status: INITIAL  6.  Pt will complete standing at the sink for up to 5 mins to wash dishes with SBA. Baseline: Difficulty standing to wash dishes, can stand 1-3 mins Goal status: INITIAL ASSESSMENT:  CLINICAL IMPRESSION: Pt cancelled her session last Thursday due to sickness.  Monday was an observed holiday so she returned this date to continue with intensive phase of LSVT BIG program.  She reports continued sickness through the weekend and did not do any of the exercises from last week, still feels weakness.  Pt continued to perform well with exercises this date, she did have a few questions regarding exercises and the handouts.  We went through each exercise with use of each print out of the exercises and answered all her questions.  Pt able to complete maximal daily exercises in the adapted version this date with min assist.  Sit to stand with min assist from edge of mat with performing 2 sets of 5 reps each.  Pt has continued to  work towards increasing her amplitude of steps during functional mobility.  Cues for  posture exercises standing at the wall.  Continue to work towards goals in plan of care to maximize safety and independence in daily tasks.       PERFORMANCE DEFICITS: in functional skills including ADLs, IADLs, coordination, dexterity, ROM, strength, flexibility, Fine motor control, Gross motor control, mobility, balance, endurance, decreased knowledge of use of DME, and UE functional use, cognitive skills including attention and memory, and psychosocial skills including environmental adaptation, habits, and routines and behaviors.   IMPAIRMENTS: are limiting patient from ADLs, IADLs, leisure, and social participation.   COMORBIDITIES:  has co-morbidities such as orthostatic hypotension, CAD, syncope, knee pain, unsteady gait, fall risk, COPD, osteoporosis,   that affects occupational performance. Patient will benefit from skilled OT to address above impairments and improve overall function.  MODIFICATION OR ASSISTANCE TO COMPLETE EVALUATION: Min-Moderate modification of tasks or assist with assess necessary to complete an evaluation.  OT OCCUPATIONAL PROFILE AND HISTORY: Detailed assessment: Review of records and additional review of physical, cognitive, psychosocial history related to current functional performance.  CLINICAL DECISION MAKING: High - multiple treatment options, significant modification of task necessary  REHAB POTENTIAL: Good  EVALUATION COMPLEXITY: High    PLAN:  OT FREQUENCY: 4x/week  OT DURATION: 4 weeks  PLANNED INTERVENTIONS: self care/ADL training, therapeutic exercise, therapeutic activity, neuromuscular re-education, manual therapy, passive range of motion, gait training, balance training, stair training, and functional mobility training  RECOMMENDED OTHER SERVICES: LSVT LOUD with SLP  CONSULTED AND AGREED WITH PLAN OF CARE: Patient and family member/caregiver  PLAN FOR NEXT SESSION: Will continue with maximal daily exercises, posture exercises, add  functional component tasks.  Timiko Offutt Oneita Jolly, OTR/L, CLT  Dreden Rivere, OT 06/06/2022, 6:19 PM

## 2022-06-06 NOTE — Therapy (Signed)
OUTPATIENT OCCUPATIONAL THERAPY [facility]  Patient Name: Victoria Hanson MRN: [id number] DOB:1950-09-28, 71 y.o., female Today's Date: 05/29/2022  PCP: Einar Pheasant REFERRING PROVIDER: Alonza Bogus   END OF SESSION:  OT End of Session - 06/06/22 1632     Visit Number 3    Number of Visits 17    Date for OT Re-Evaluation 06/29/22    OT Start Time 1000    OT Stop Time 1101    OT Time Calculation (min) 61 min    Activity Tolerance Patient tolerated treatment well    Behavior During Therapy [facility] for tasks assessed/performed             Past Medical History:  Diagnosis Date   Anxiety    Asthma    Atypical chest pain    a. 2003 Cath: reportedly nl per pt [name]);  b. 04/2015 [facility] Admission, neg troponin.   Colon cancer ([place]) 1999   a. 1999: Pt had partial colectomy. Did develop lung metastasis. Had right upper lobectomy in 01/2007 then had associated chemotherapy.   COPD (chronic obstructive pulmonary disease) (HCC)    DDD (degenerative disc disease), lumbosacral    Depression    Echocardiogram abnormal    a. 02/2010 Echo: EF 55-60%, mild LAE, no regional wall motion abnormalities   Elevated transaminase level    a. ? NASH;  b. 06/2015 - nl LFTs.   GERD (gastroesophageal reflux disease)    Hematuria    Holter monitor, abnormal    a. 12/2009 Holter monitoring: NSR, rare PACs and PVCs.   Hypercholesterolemia    Hyperlipidemia    Hypertension    Leaky heart valve    a. Per pt report - no evidence of valvular abnormalities on prior echoes.   Lesion of skin of Right breast 10/20/2017   Lung metastases 2008   a. S/P resection (lobectomy - right)   Morbid obesity ([place])    Nephrolithiasis    pt denies   Parkinson disease 08/2016   [name]   Past Surgical History:  Procedure Laterality Date   BREAST EXCISIONAL BIOPSY Right 10/07/2009   benign   CARDIAC CATHETERIZATION  2003   negative as per pt report   CHOLECYSTECTOMY  2000   COLON SURGERY      Colon cancer, removed part of large colon   COLONOSCOPY N/A 04/25/2015   Procedure: COLONOSCOPY;  Surgeon: Manya Silvas, MD;  Location: [facility];  Service: Endoscopy;  Laterality: N/A;   COLONOSCOPY WITH PROPOFOL N/A 06/14/2017   Procedure: COLONOSCOPY WITH PROPOFOL;  Surgeon: Manya Silvas, MD;  Location: [facility] ENDOSCOPY;  Service: Endoscopy;  Laterality: N/A;   COLONOSCOPY WITH PROPOFOL N/A 11/17/2019   Procedure: COLONOSCOPY WITH PROPOFOL;  Surgeon: [name], MD;  Location[facility] ENDOSCOPY;  Service: Gastroenterology;  Laterality: N/A;   COLONOSCOPY WITH PROPOFOL N/A 03/21/2021   Procedure: COLONOSCOPY WITH PROPOFOL;  Surgeon: Lesly Rubenstein, MD;  Location: [facility] ENDOSCOPY;  Service: Endoscopy;  Laterality: N/A;   ESOPHAGOGASTRODUODENOSCOPY (EGD) WITH PROPOFOL N/A 03/21/2021   Procedure: ESOPHAGOGASTRODUODENOSCOPY (EGD) WITH PROPOFOL;  Surgeon: Lesly Rubenstein, MD;  Location: [facility] ENDOSCOPY;  Service: Endoscopy;  Laterality: N/A;   LOBECTOMY  01/2007   Right lung, upper lobe right side removed   TOTAL ABDOMINAL HYSTERECTOMY     abnormal bleeding   TUBAL LIGATION  1980   Patient Active Problem List   Diagnosis Date Noted   Hypophonia 05/23/2022   Left knee pain 05/02/2022   Parkinson disease 03/12/2022   Change in  bowel movement 02/05/2022   Lower extremity edema 01/30/2022   Cloudy urine 11/29/2021   History of total knee arthroplasty 09/13/2021   Hepatic steatosis 09/13/2021   Urinary frequency 03/26/2021   Right knee pain 01/01/2021   UTI (urinary tract infection) 11/27/2020   Vitamin D deficiency 11/05/2020   Aortic atherosclerosis ([place]) 11/05/2020   Dizziness 08/24/2020   Cardiomegaly 08/24/2020   Constipation 11/11/2019   Anemia 11/11/2019   Near syncope 02/13/2019   Fibromuscular dysplasia ([place]) 02/02/2019   Abdominal bloating 01/04/2019   Unsteady gait 12/03/2018   History of UTI 10/08/2018   Ear fullness, right 10/08/2018   B12 deficiency  03/11/2018   Goals of care, counseling/discussion 10/15/2017   Orthostatic hypotension due to Parkinson's disease ([place]) 07/31/2017   COPD exacerbation ([place]) 07/31/2017   REM behavioral disorder 05/28/2017   Elevated liver function tests 08/25/2016   Carotid artery disease ([place]) 07/19/2016   Tremor 06/01/2016   Osteoporosis 04/08/2016   Foot pain, bilateral 07/24/2015   Head pain 07/24/2015   Hyperlipidemia 06/21/2015   Other fatigue 06/21/2015   BMI 33.0-33.9,adult 06/21/2015   Disorder of kidney and ureter 03/28/2015   Hyperglycemia 03/20/2015   Swelling of lower extremity 11/20/2014   Dysuria 10/24/2014   Health care maintenance 07/17/2014   Hypokalemia 04/05/2013   TIA (transient ischemic attack) 02/09/2013   Transient cerebral ischemia 02/09/2013   Anxiety, mild 03/13/2012   Depression, recurrent ([place]) 03/13/2012   History of colon cancer 03/13/2012   Hypertension 03/13/2012   CKD (chronic kidney disease), stage III ([place]) 03/13/2012   GERD (gastroesophageal reflux disease) 03/13/2012   Acid reflux 03/13/2012   History of malignant neoplasm metastatic to lung 03/13/2012   Major depressive disorder with single episode 03/13/2012   Hypercholesterolemia 02/02/2010   SOB (shortness of breath) on exertion 02/02/2010   Pure hypercholesterolemia 02/02/2010    ONSET DATE: 05/23/2022, diagnosed with Parkinson's in 2018  REFERRING DIAG: Parkinson's disease, LSVT BIG  THERAPY DIAG:  Muscle weakness (generalized)  Other lack of coordination  Abnormal posture  Difficulty in walking, not elsewhere classified  Unsteadiness on feet  Rationale for Evaluation and Treatment: Rehabilitation  SUBJECTIVE:   SUBJECTIVE STATEMENT:  Pt reports she is doing well with her medication change, denies any soreness or pain after performing exercises from last date.  Requested daughter come into session next date for family teaching of maximal daily exercises, she is agreeable.     Pt  accompanied by: family member  PERTINENT HISTORY: Pt diagnosed with Parkinson's disease in 2018.  Pt with multiple visits to the ER this year with BP issues, UTI, weakness and changes in medication.  Pt requiring increased assistance from family with all self care and mobility.  She had a knee replacement on the left March 2023.  Tremors in bilateral hands.Pt has a history of hypotension.  PRECAUTIONS: Fall, Pt has a history of Neurogenic Orthostatic Hypotension  WEIGHT BEARING RESTRICTIONS: No  PAIN:  Are you having pain? No  FALLS: Has patient fallen in last 6 months? No  LIVING ENVIRONMENT: Lives with: lives with their family Lives in: House/apartment Stairs: No Has following equipment at home: Gilford Rile - 2 wheeled, shower chair, bed side commode, and Grab bars  PLOF: Needs assistance with ADLs, Needs assistance with homemaking, and Needs assistance with transfers  PATIENT GOALS: Pt reports she would like to be as independent as possible, do more for herself  OBJECTIVE:   HAND DOMINANCE: Right  ADLs: Overall ADLs: Pt reports her daughter helps her  with bathing and dressing Transfers/ambulation related to ADLs: Eating: Able to feed self independently Grooming: Able to complete grooming with modified independence, difficulty with standing for long periods of time UB Dressing: min to mod assist LB Dressing: mod to max assist Toileting: min to mod assist  Bathing: max assist Tub Shower transfers: mod assist, has shower chair and grab bar in shower. Equipment: Shower seat with back, Grab bars, and bed side commode  IADLs: Shopping: Dependent Light housekeeping: Reports she tries to help wash dishes but cannot stand very long Meal Prep: Max to dependent  Community mobility: does not drive, family provides transportation, ambulates with rolling walker  Medication management: TBA Financial management: TBA Handwriting: 25% legible, demonstrates severe micrographia  MOBILITY  STATUS: Needs Assist: assist with sit to stand transfer, ambulates with RW, difficulty with turns, and start hesitation, freezing of gait noted.  POSTURE COMMENTS:  rounded shoulders and forward head  ACTIVITY TOLERANCE: Activity tolerance: Fair, patient requires frequent rest breaks during evaluation and during mobility tasks  FUNCTIONAL OUTCOME MEASURES: FOTO 6 min walk test 350 feet 5 times sit to stand unable to perform sit to stand without use of arms BERG balance: TBA next session  COORDINATION: Finger Nose Finger test: impaired  9 Hole Peg test: Right: 50 sec; Left: 56 sec  UE/LE ROM:   Pt with limited ROM of bilateral UEs, LE:  right knee extension full, left 30 degrees of knee extension and limited dorsiflexion of knee  UE MMT:    3/5 overall strength in UE, LE:  right 3/5, left 2-/5  MUSCLE TONE: Increased muscle tone L greater than R, UE greater than LE.    SENSATION: WFL  COGNITION: Overall cognitive status: Within functional limits for tasks assessed  OBSERVATIONS: Bradykinesia   TODAY'S TREATMENT:                                                                                                                              DATE: 05/29/2022  Neuromuscular Reeducation:  Patient seen for initial instruction of LSVT BIG exercises: LSVT Daily Session Maximal Daily Exercises: Sustained movements are designed to rescale the amplitude of movement output for generalization to daily functional activities. Performed as follows for 1 set of 10 repetitions each: Multi directional sustained movements- 1) Floor to ceiling, 2) Side to side. Multi directional Repetitive movements performed in standing and are designed to provide retraining effort needed for sustained muscle activation in tasks Performed as follows: 3) Step and reach forward, 4) Step and Reach Backwards, 5) Step and reach sideways, 6) Rock and reach forward/backward, 7) Rock and reach sideways. Sit to stand from mat  table on lowest setting with cues for weight shift, technique and min to mod assist for 10 reps for 1 set.    All exercises were performed in an ADAPTED version of the LSVT BIG protocol for maximal daily exercises.  The adapted version utilizes a chair to assist with balance.  Pt requiring min to mod assist for exercises in standing for transitional movements and maintaining balance.  Pt requires therapist demo, verbal cues and tactile cues for proper form and technique.   Pt to practice the first 2 exercises in sitting at home as well as sit to stand from her chair as her HEP, we will perform family education on home exercise program next date with daughter.    Functional mobility: Pt seen for mobility on even surface, utilizing rolling walker.  Cues for upright posture and to increase amplitude of steps with forward motion.  Therapist providing min assist to min guard.  Ambulated 2 trials of 125 feet, pt required 1 rest break seated during task.      PATIENT EDUCATION: Education details: LSVT BIG program, POC, goals, role of OT Person educated: Patient Education method: Explanation Education comprehension: verbalized understanding  HOME EXERCISE PROGRAM: Will initiate home program next session with implementation of LSVT BIG maximal daily exercises  GOALS: Goals reviewed with patient? Yes  SHORT TERM GOALS: Target date: 06/08/2022  Pt will demonstrate sit to stand from chair with modified independence, using arm rest as needed to push up from chair.  Baseline: min to mod assist Goal status: INITIAL   LONG TERM GOALS: Target date: 06/29/2022  Patient will improve gait speed and endurance to be able to ambulate 500 feet in 6 minutes to negotiate around the home and community safely in 4 weeks.  Baseline: Eval 350 feet  Goal status: INITIAL  2.  Patient will complete HEP for maximal daily exercises with min to mod assist in 4 weeks.  Baseline: Eval-no current program Goal status:  INITIAL  3.  Patient will transfer from to sit to stand safely and independently from a variety of chairs/surfaces in 4 weeks.  Baseline: min to mod assist for sit to stand Goal status: INITIAL  4.  Pt will complete dressing with min assist  Baseline: mod to max assist at eval  Goal status: INITIAL  5.  Pt will complete bathing with min assist  Baseline: max assist Goal status: INITIAL  6.  Pt will complete standing at the sink for up to 5 mins to wash dishes with SBA. Baseline: Difficulty standing to wash dishes, can stand 1-3 mins Goal status: INITIAL ASSESSMENT:  CLINICAL IMPRESSION: Pt performing well this date, unable to recall specific exercises from previous day but improved with sit to stand from mat at lowest setting with min assist to min guard for a portion of the repetitions.  Pt fatigues quickly with tasks in standing and with functional mobility, requires short rest breaks.  Pt will likely require exercises in adapted version for safe completion at home with the use of a chair for balance and family assist/supervision as needed.  Will plan to educate daughter next date on HEP with written/pictorial handouts for patient to start daily home program in addition to exercises within therapy session.    PERFORMANCE DEFICITS: in functional skills including ADLs, IADLs, coordination, dexterity, ROM, strength, flexibility, Fine motor control, Gross motor control, mobility, balance, endurance, decreased knowledge of use of DME, and UE functional use, cognitive skills including attention and memory, and psychosocial skills including environmental adaptation, habits, and routines and behaviors.   IMPAIRMENTS: are limiting patient from ADLs, IADLs, leisure, and social participation.   COMORBIDITIES:  has co-morbidities such as orthostatic hypotension, CAD, syncope, knee pain, unsteady gait, fall risk, COPD, osteoporosis,   that affects occupational performance. Patient will benefit from  skilled OT  to address above impairments and improve overall function.  MODIFICATION OR ASSISTANCE TO COMPLETE EVALUATION: Min-Moderate modification of tasks or assist with assess necessary to complete an evaluation.  OT OCCUPATIONAL PROFILE AND HISTORY: Detailed assessment: Review of records and additional review of physical, cognitive, psychosocial history related to current functional performance.  CLINICAL DECISION MAKING: High - multiple treatment options, significant modification of task necessary  REHAB POTENTIAL: Good  EVALUATION COMPLEXITY: High    PLAN:  OT FREQUENCY: 4x/week  OT DURATION: 4 weeks  PLANNED INTERVENTIONS: self care/ADL training, therapeutic exercise, therapeutic activity, neuromuscular re-education, manual therapy, passive range of motion, gait training, balance training, stair training, and functional mobility training  RECOMMENDED OTHER SERVICES: LSVT LOUD with SLP  CONSULTED AND AGREED WITH PLAN OF CARE: Patient and family member/caregiver  PLAN FOR NEXT SESSION: Will educate and perform maximal daily exercises, BERG balance assessment   Corianne Buccellato T Anadia Helmes, OTR/L, CLT  Alisea Matte, OT 06/06/2022, 5:37 PM

## 2022-06-06 NOTE — Therapy (Unsigned)
OUTPATIENT OCCUPATIONAL THERAPY [facility]  Patient Name: Victoria Hanson MRN: [id number] DOB:[birth year], 71 y.o., female Today's Date: 06/06/2022  PCP: Einar Pheasant REFERRING PROVIDER: Alonza Bogus   END OF SESSION:  OT End of Session - 06/06/22 1831     Visit Number 6    Number of Visits 17    Date for OT Re-Evaluation 06/29/22    OT Start Time 1101    OT Stop Time 1203    OT Time Calculation (min) 62 min    Activity Tolerance Patient tolerated treatment well    Behavior During Therapy [facility] for tasks assessed/performed             Past Medical History:  Diagnosis Date   Anxiety    Asthma    Atypical chest pain    a. 2003 Cath: reportedly nl per pt [name]);  b. 04/2015 [place] Admission, neg troponin.   Colon cancer ([address]) 1999   a. 1999: Pt had partial colectomy. Did develop lung metastasis. Had right upper lobectomy in 01/2007 then had associated chemotherapy.   COPD (chronic obstructive pulmonary disease) (HCC)    DDD (degenerative disc disease), lumbosacral    Depression    Echocardiogram abnormal    a. 02/2010 Echo: EF 55-60%, mild LAE, no regional wall motion abnormalities   Elevated transaminase level    a. ? NASH;  b. 06/2015 - nl LFTs.   GERD (gastroesophageal reflux disease)    Hematuria    Holter monitor, abnormal    a. 12/2009 Holter monitoring: NSR, rare PACs and PVCs.   Hypercholesterolemia    Hyperlipidemia    Hypertension    Leaky heart valve    a. Per pt report - no evidence of valvular abnormalities on prior echoes.   Lesion of skin of Right breast 10/20/2017   Lung metastases 2008   a. S/P resection (lobectomy - right)   Morbid obesity ([place])    Nephrolithiasis    pt denies   Parkinson disease 08/2016   [name]   Past Surgical History:  Procedure Laterality Date   BREAST EXCISIONAL BIOPSY Right 10/07/2009   benign   CARDIAC CATHETERIZATION  2003   negative as per pt report   CHOLECYSTECTOMY  2000   COLON SURGERY      Colon cancer, removed part of large colon   COLONOSCOPY N/A 04/25/2015   Procedure: COLONOSCOPY;  Surgeon: Manya Silvas, MD;  Location: [place];  Service: Endoscopy;  Laterality: N/A;   COLONOSCOPY WITH PROPOFOL N/A 06/14/2017   Procedure: COLONOSCOPY WITH PROPOFOL;  Surgeon: Manya Silvas, MD;  Location[facility] ENDOSCOPY;  Service: Endoscopy;  Laterality: N/A;   COLONOSCOPY WITH PROPOFOL N/A 11/17/2019   Procedure: COLONOSCOPY WITH PROPOFOL;  Surgeon: [name], MD;  Location: [facility] ENDOSCOPY;  Service: Gastroenterology;  Laterality: N/A;   COLONOSCOPY WITH PROPOFOL N/A 03/21/2021   Procedure: COLONOSCOPY WITH PROPOFOL;  Surgeon: Lesly Rubenstein, MD;  Location: [facility] ENDOSCOPY;  Service: Endoscopy;  Laterality: N/A;   ESOPHAGOGASTRODUODENOSCOPY (EGD) WITH PROPOFOL N/A 03/21/2021   Procedure: ESOPHAGOGASTRODUODENOSCOPY (EGD) WITH PROPOFOL;  Surgeon: Lesly Rubenstein, MD;  Location: [facility] ENDOSCOPY;  Service: Endoscopy;  Laterality: N/A;   LOBECTOMY  01/2007   Right lung, upper lobe right side removed   TOTAL ABDOMINAL HYSTERECTOMY     abnormal bleeding   TUBAL LIGATION  1980   Patient Active Problem List   Diagnosis Date Noted   Hypophonia 05/23/2022   Left knee pain 05/02/2022   Parkinson disease 03/12/2022   Change in  bowel movement 02/05/2022   Lower extremity edema 01/30/2022   Cloudy urine 11/29/2021   History of total knee arthroplasty 09/13/2021   Hepatic steatosis 09/13/2021   Urinary frequency 03/26/2021   Right knee pain 01/01/2021   UTI (urinary tract infection) 11/27/2020   Vitamin D deficiency 11/05/2020   Aortic atherosclerosis ([place]) 11/05/2020   Dizziness 08/24/2020   Cardiomegaly 08/24/2020   Constipation 11/11/2019   Anemia 11/11/2019   Near syncope 02/13/2019   Fibromuscular dysplasia ([place]) 02/02/2019   Abdominal bloating 01/04/2019   Unsteady gait 12/03/2018   History of UTI 10/08/2018   Ear fullness, right 10/08/2018   B12 deficiency  03/11/2018   Goals of care, counseling/discussion 10/15/2017   Orthostatic hypotension due to Parkinson's disease ([place]) 07/31/2017   COPD exacerbation ([place]) 07/31/2017   REM behavioral disorder 05/28/2017   Elevated liver function tests 08/25/2016   Carotid artery disease ([place]) 07/19/2016   Tremor 06/01/2016   Osteoporosis 04/08/2016   Foot pain, bilateral 07/24/2015   Head pain 07/24/2015   Hyperlipidemia 06/21/2015   Other fatigue 06/21/2015   BMI 33.0-33.9,adult 06/21/2015   Disorder of kidney and ureter 03/28/2015   Hyperglycemia 03/20/2015   Swelling of lower extremity 11/20/2014   Dysuria 10/24/2014   Health care maintenance 07/17/2014   Hypokalemia 04/05/2013   TIA (transient ischemic attack) 02/09/2013   Transient cerebral ischemia 02/09/2013   Anxiety, mild 03/13/2012   Depression, recurrent ([place]) 03/13/2012   History of colon cancer 03/13/2012   Hypertension 03/13/2012   CKD (chronic kidney disease), stage III ([place]) 03/13/2012   GERD (gastroesophageal reflux disease) 03/13/2012   Acid reflux 03/13/2012   History of malignant neoplasm metastatic to lung 03/13/2012   Major depressive disorder with single episode 03/13/2012   Hypercholesterolemia 02/02/2010   SOB (shortness of breath) on exertion 02/02/2010   Pure hypercholesterolemia 02/02/2010    ONSET DATE: 05/23/2022, diagnosed with Parkinson's in 2018  REFERRING DIAG: Parkinson's disease, LSVT BIG  THERAPY DIAG:  Muscle weakness (generalized)  Unsteadiness on feet  Other lack of coordination  Abnormal posture  Difficulty in walking, not elsewhere classified  Rationale for Evaluation and Treatment: Rehabilitation  SUBJECTIVE:   SUBJECTIVE STATEMENT: Pt reports her whole family has been sick this past week.  She is feeling better and has tried doing some of the exercises at home but still feels like she has questions and needs assist.  Daughter has not been coming into therapy session and staying in  the car due to cough.    Pt accompanied by: family member  PERTINENT HISTORY: Pt diagnosed with Parkinson's disease in 2018.  Pt with multiple visits to the ER this year with BP issues, UTI, weakness and changes in medication.  Pt requiring increased assistance from family with all self care and mobility.  She had a knee replacement on the left March 2023.  Tremors in bilateral hands.Pt has a history of hypotension.  PRECAUTIONS: Fall, Pt has a history of Neurogenic Orthostatic Hypotension  WEIGHT BEARING RESTRICTIONS: No  PAIN:  Are you having pain? No  FALLS: Has patient fallen in last 6 months? No  LIVING ENVIRONMENT: Lives with: lives with their family Lives in: House/apartment Stairs: No Has following equipment at home: Gilford Rile - 2 wheeled, shower chair, bed side commode, and Grab bars  PLOF: Needs assistance with ADLs, Needs assistance with homemaking, and Needs assistance with transfers  PATIENT GOALS: Pt reports she would like to be as independent as possible, do more for herself  OBJECTIVE:   HAND  DOMINANCE: Right  ADLs: Overall ADLs: Pt reports her daughter helps her with bathing and dressing Transfers/ambulation related to ADLs: Eating: Able to feed self independently Grooming: Able to complete grooming with modified independence, difficulty with standing for long periods of time UB Dressing: min to mod assist LB Dressing: mod to max assist Toileting: min to mod assist  Bathing: max assist Tub Shower transfers: mod assist, has shower chair and grab bar in shower. Equipment: Shower seat with back, Grab bars, and bed side commode  IADLs: Shopping: Dependent Light housekeeping: Reports she tries to help wash dishes but cannot stand very long Meal Prep: Max to dependent  Community mobility: does not drive, family provides transportation, ambulates with rolling walker  Medication management: TBA Financial management: TBA Handwriting: 25% legible, demonstrates  severe micrographia  MOBILITY STATUS: Needs Assist: assist with sit to stand transfer, ambulates with RW, difficulty with turns, and start hesitation, freezing of gait noted.  POSTURE COMMENTS:  rounded shoulders and forward head  ACTIVITY TOLERANCE: Activity tolerance: Fair, patient requires frequent rest breaks during evaluation and during mobility tasks  FUNCTIONAL OUTCOME MEASURES: FOTO 6 min walk test 350 feet 5 times sit to stand unable to perform sit to stand without use of arms BERG balance: TBA next session  COORDINATION: Finger Nose Finger test: impaired  9 Hole Peg test: Right: 50 sec; Left: 56 sec  UE/LE ROM:   Pt with limited ROM of bilateral UEs, LE:  right knee extension full, left 30 degrees of knee extension and limited dorsiflexion of knee  UE MMT:    3/5 overall strength in UE, LE:  right 3/5, left 2-/5  MUSCLE TONE: Increased muscle tone L greater than R, UE greater than LE.    SENSATION: WFL  COGNITION: Overall cognitive status: Within functional limits for tasks assessed  OBSERVATIONS: Bradykinesia   TODAY'S TREATMENT:                                                                                                                              DATE: 06/06/2022  Neuromuscular Reeducation:  Patient seen for initial instruction of LSVT BIG exercises: LSVT Daily Session Maximal Daily Exercises: Sustained movements are designed to rescale the amplitude of movement output for generalization to daily functional activities. Performed as follows for 1 set of 10 repetitions each: Multi directional sustained movements- 1) Floor to ceiling, 2) Side to side. Multi directional Repetitive movements performed in standing and are designed to provide retraining effort needed for sustained muscle activation in tasks Performed as follows: 3) Step and reach forward, 4) Step and Reach Backwards, 5) Step and reach sideways, 6) Rock and reach forward/backward, 7) Rock and reach  sideways. Sit to stand from mat table on lowest setting with cues for weight shift, technique and min to mod assist for 10 reps for 1 set.    All exercises were performed in an ADAPTED version of the LSVT BIG protocol for maximal daily exercises.  The adapted version utilizes a chair to assist with balance.  Pt requiring min assist for exercises in standing for transitional movements and maintaining balance.  Pt requires therapist demo, verbal cues and tactile cues for proper form and technique but is becoming more familiar with exercises each session.  Posture exercises in sitting and standing this date, cues for shoulder retraction and promotion of upright posture.    Functional mobility: Pt seen for mobility on even surface, utilizing rolling walker.  Cues for upright posture and to increase amplitude of steps with forward motion.  Therapist providing min guard.  Pt was able to ambulate down to Rehab department this date rather than using transport chair.  During session she ambulated 1 trial of 115 feet, 2nd trial of 150 feet, pt required 1 rest break seated during task.   Car transfer with min assist and cues managing lower extremities to get into vehicle.  Added functional hand exercises this date: 7  hand exercises to help promote handwriting, dexterity and flexibility. 1)  Fisting with arm then arm extension with fingers extended with BIG hand movements 2) oppositional movements of the thumb to each digit 3) wrist flex/ext with elbows extended 4) supination/pronation 5) tendon gliding with hook fist and then finger extension 6) tendon gliding with tabletop movement of fingers with MP flexion, PIP and DIP extension 7) MP flexion, PIP flexion, DIP extension Performed with therapist demo and moderate cues for form    PATIENT EDUCATION: Education details: LSVT BIG program, POC, goals, role of OT Person educated: Patient Education method: Explanation Education comprehension: verbalized  understanding  HOME EXERCISE PROGRAM: Will initiate home program next session with implementation of LSVT BIG maximal daily exercises  GOALS: Goals reviewed with patient? Yes  SHORT TERM GOALS: Target date: 06/08/2022  Pt will demonstrate sit to stand from chair with modified independence, using arm rest as needed to push up from chair.  Baseline: min to mod assist Goal status: INITIAL   LONG TERM GOALS: Target date: 06/29/2022  Patient will improve gait speed and endurance to be able to ambulate 500 feet in 6 minutes to negotiate around the home and community safely in 4 weeks.  Baseline: Eval 350 feet  Goal status: INITIAL  2.  Patient will complete HEP for maximal daily exercises with min to mod assist in 4 weeks.  Baseline: Eval-no current program Goal status: INITIAL  3.  Patient will transfer from to sit to stand safely and independently from a variety of chairs/surfaces in 4 weeks.  Baseline: min to mod assist for sit to stand Goal status: INITIAL  4.  Pt will complete dressing with min assist  Baseline: mod to max assist at eval  Goal status: INITIAL  5.  Pt will complete bathing with min assist  Baseline: max assist Goal status: INITIAL  6.  Pt will complete standing at the sink for up to 5 mins to wash dishes with SBA. Baseline: Difficulty standing to wash dishes, can stand 1-3 mins Goal status: INITIAL ASSESSMENT:  CLINICAL IMPRESSION: Pt cancelled her session last Thursday due to sickness.  Monday was an observed holiday so she returned this date to continue with intensive phase of LSVT BIG program.  She reports continued sickness through the weekend and did not do any of the exercises from last week, still feels weakness.  Pt continued to perform well with exercises this date, she did have a few questions regarding exercises and the handouts.  We went through each exercise with  use of each print out of the exercises and answered all her questions.  Pt able to  complete maximal daily exercises in the adapted version this date with min assist.  Sit to stand with min assist from edge of mat with performing 2 sets of 5 reps each.  Pt has continued to work towards increasing her amplitude of steps during functional mobility.  Cues for posture exercises standing at the wall.  Continue to work towards goals in plan of care to maximize safety and independence in daily tasks.       PERFORMANCE DEFICITS: in functional skills including ADLs, IADLs, coordination, dexterity, ROM, strength, flexibility, Fine motor control, Gross motor control, mobility, balance, endurance, decreased knowledge of use of DME, and UE functional use, cognitive skills including attention and memory, and psychosocial skills including environmental adaptation, habits, and routines and behaviors.   IMPAIRMENTS: are limiting patient from ADLs, IADLs, leisure, and social participation.   COMORBIDITIES:  has co-morbidities such as orthostatic hypotension, CAD, syncope, knee pain, unsteady gait, fall risk, COPD, osteoporosis,   that affects occupational performance. Patient will benefit from skilled OT to address above impairments and improve overall function.  MODIFICATION OR ASSISTANCE TO COMPLETE EVALUATION: Min-Moderate modification of tasks or assist with assess necessary to complete an evaluation.  OT OCCUPATIONAL PROFILE AND HISTORY: Detailed assessment: Review of records and additional review of physical, cognitive, psychosocial history related to current functional performance.  CLINICAL DECISION MAKING: High - multiple treatment options, significant modification of task necessary  REHAB POTENTIAL: Good  EVALUATION COMPLEXITY: High    PLAN:  OT FREQUENCY: 4x/week  OT DURATION: 4 weeks  PLANNED INTERVENTIONS: self care/ADL training, therapeutic exercise, therapeutic activity, neuromuscular re-education, manual therapy, passive range of motion, gait training, balance training,  stair training, and functional mobility training  RECOMMENDED OTHER SERVICES: LSVT LOUD with SLP  CONSULTED AND AGREED WITH PLAN OF CARE: Patient and family member/caregiver  PLAN FOR NEXT SESSION: Will continue with maximal daily exercises, posture exercises, add functional component tasks.  Tiani Stanbery Oneita Jolly, OTR/L, CLT  Suhani Stillion, OT 06/06/2022, 6:33 PM

## 2022-06-06 NOTE — Therapy (Addendum)
OUTPATIENT OCCUPATIONAL THERAPY PARKINSON'S TREATMENT  Patient Name: Victoria Hanson MRN: [id number] DOB:[birth year], 71 y.o., female Today's Date: 05/28/2022  PCP: Einar Pheasant REFERRING PROVIDER: Alonza Bogus   END OF SESSION:  OT End of Session - 06/06/22 1614     Visit Number 2    Number of Visits 17    Date for OT Re-Evaluation 06/29/22    OT Start Time 1015    OT Stop Time 1114    OT Time Calculation (min) 59 min    Activity Tolerance Patient tolerated treatment well    Behavior During Therapy [facility] for tasks assessed/performed             Past Medical History:  Diagnosis Date   Anxiety    Asthma    Atypical chest pain    a. 2003 Cath: reportedly nl per pt [name]);  b. 04/2015 [place] Admission, neg troponin.   Colon cancer ([place]) 1999   a. 1999: Pt had partial colectomy. Did develop lung metastasis. Had right upper lobectomy in 01/2007 then had associated chemotherapy.   COPD (chronic obstructive pulmonary disease) (HCC)    DDD (degenerative disc disease), lumbosacral    Depression    Echocardiogram abnormal    a. 02/2010 Echo: EF 55-60%, mild LAE, no regional wall motion abnormalities   Elevated transaminase level    a. ? NASH;  b. 06/2015 - nl LFTs.   GERD (gastroesophageal reflux disease)    Hematuria    Holter monitor, abnormal    a. 12/2009 Holter monitoring: NSR, rare PACs and PVCs.   Hypercholesterolemia    Hyperlipidemia    Hypertension    Leaky heart valve    a. Per pt report - no evidence of valvular abnormalities on prior echoes.   Lesion of skin of Right breast 10/20/2017   Lung metastases 2008   a. S/P resection (lobectomy - right)   Morbid obesity ([place])    Nephrolithiasis    pt denies   Parkinson disease 08/2016   [name]   Past Surgical History:  Procedure Laterality Date   BREAST EXCISIONAL BIOPSY Right 10/07/2009   benign   CARDIAC CATHETERIZATION  2003   negative as per pt report   CHOLECYSTECTOMY  2000   COLON SURGERY      Colon cancer, removed part of large colon   COLONOSCOPY N/A 04/25/2015   Procedure: COLONOSCOPY;  Surgeon: Manya Silvas, MD;  Location: [place];  Service: Endoscopy;  Laterality: N/A;   COLONOSCOPY WITH PROPOFOL N/A 06/14/2017   Procedure: COLONOSCOPY WITH PROPOFOL;  Surgeon: Manya Silvas, MD;  Location: [facility] ENDOSCOPY;  Service: Endoscopy;  Laterality: N/A;   COLONOSCOPY WITH PROPOFOL N/A 11/17/2019   Procedure: COLONOSCOPY WITH PROPOFOL;  Surgeon: [name], MD;  Location: [facility] ENDOSCOPY;  Service: Gastroenterology;  Laterality: N/A;   COLONOSCOPY WITH PROPOFOL N/A 03/21/2021   Procedure: COLONOSCOPY WITH PROPOFOL;  Surgeon: Lesly Rubenstein, MD;  Location: [facility] ENDOSCOPY;  Service: Endoscopy;  Laterality: N/A;   ESOPHAGOGASTRODUODENOSCOPY (EGD) WITH PROPOFOL N/A 03/21/2021   Procedure: ESOPHAGOGASTRODUODENOSCOPY (EGD) WITH PROPOFOL;  Surgeon: Lesly Rubenstein, MD;  Location: [facility] ENDOSCOPY;  Service: Endoscopy;  Laterality: N/A;   LOBECTOMY  01/2007   Right lung, upper lobe right side removed   TOTAL ABDOMINAL HYSTERECTOMY     abnormal bleeding   TUBAL LIGATION  1980   Patient Active Problem List   Diagnosis Date Noted   Hypophonia 05/23/2022   Left knee pain 05/02/2022   Parkinson disease 03/12/2022   Change in  bowel movement 02/05/2022   Lower extremity edema 01/30/2022   Cloudy urine 11/29/2021   History of total knee arthroplasty 09/13/2021   Hepatic steatosis 09/13/2021   Urinary frequency 03/26/2021   Right knee pain 01/01/2021   UTI (urinary tract infection) 11/27/2020   Vitamin D deficiency 11/05/2020   Aortic atherosclerosis ([place]) 11/05/2020   Dizziness 08/24/2020   Cardiomegaly 08/24/2020   Constipation 11/11/2019   Anemia 11/11/2019   Near syncope 02/13/2019   Fibromuscular dysplasia ([place]) 02/02/2019   Abdominal bloating 01/04/2019   Unsteady gait 12/03/2018   History of UTI 10/08/2018   Ear fullness, right 10/08/2018   B12 deficiency  03/11/2018   Goals of care, counseling/discussion 10/15/2017   Orthostatic hypotension due to Parkinson's disease ([place]) 07/31/2017   COPD exacerbation ([place]) 07/31/2017   REM behavioral disorder 05/28/2017   Elevated liver function tests 08/25/2016   Carotid artery disease ([place]) 07/19/2016   Tremor 06/01/2016   Osteoporosis 04/08/2016   Foot pain, bilateral 07/24/2015   Head pain 07/24/2015   Hyperlipidemia 06/21/2015   Other fatigue 06/21/2015   BMI 33.0-33.9,adult 06/21/2015   Disorder of kidney and ureter 03/28/2015   Hyperglycemia 03/20/2015   Swelling of lower extremity 11/20/2014   Dysuria 10/24/2014   Health care maintenance 07/17/2014   Hypokalemia 04/05/2013   TIA (transient ischemic attack) 02/09/2013   Transient cerebral ischemia 02/09/2013   Anxiety, mild 03/13/2012   Depression, recurrent ([place]) 03/13/2012   History of colon cancer 03/13/2012   Hypertension 03/13/2012   CKD (chronic kidney disease), stage III ([place]) 03/13/2012   GERD (gastroesophageal reflux disease) 03/13/2012   Acid reflux 03/13/2012   History of malignant neoplasm metastatic to lung 03/13/2012   Major depressive disorder with single episode 03/13/2012   Hypercholesterolemia 02/02/2010   SOB (shortness of breath) on exertion 02/02/2010   Pure hypercholesterolemia 02/02/2010    ONSET DATE: 05/23/2022, diagnosed with Parkinson's in 2018  REFERRING DIAG: Parkinson's disease, LSVT BIG  THERAPY DIAG:  Muscle weakness (generalized)  Other lack of coordination  Abnormal posture  Unsteadiness on feet  Difficulty in walking, not elsewhere classified  Rationale for Evaluation and Treatment: Rehabilitation  SUBJECTIVE:   SUBJECTIVE STATEMENT:  Pt reports they changed her medication last week and she is now back on sinemet.  She is ready to get started with intensive phase of therapy for LSVT BIG program.  Daughter brought her to therapy today.    Pt accompanied by: family  member  PERTINENT HISTORY: Pt diagnosed with Parkinson's disease in 2018.  Pt with multiple visits to the ER this year with BP issues, UTI, weakness and changes in medication.  Pt requiring increased assistance from family with all self care and mobility.  She had a knee replacement on the left March 2023.  Tremors in bilateral hands.Pt has a history of hypotension.  PRECAUTIONS: Fall, Pt has a history of Neurogenic Orthostatic Hypotension  WEIGHT BEARING RESTRICTIONS: No  PAIN:  Are you having pain? No  FALLS: Has patient fallen in last 6 months? No  LIVING ENVIRONMENT: Lives with: lives with their family Lives in: House/apartment Stairs: No Has following equipment at home: Gilford Rile - 2 wheeled, shower chair, bed side commode, and Grab bars  PLOF: Needs assistance with ADLs, Needs assistance with homemaking, and Needs assistance with transfers  PATIENT GOALS: Pt reports she would like to be as independent as possible, do more for herself  OBJECTIVE:   HAND DOMINANCE: Right  ADLs: Overall ADLs: Pt reports her daughter helps her with bathing  and dressing Transfers/ambulation related to ADLs: Eating: Able to feed self independently Grooming: Able to complete grooming with modified independence, difficulty with standing for long periods of time UB Dressing: min to mod assist LB Dressing: mod to max assist Toileting: min to mod assist  Bathing: max assist Tub Shower transfers: mod assist, has shower chair and grab bar in shower. Equipment: Shower seat with back, Grab bars, and bed side commode  IADLs: Shopping: Dependent Light housekeeping: Reports she tries to help wash dishes but cannot stand very long Meal Prep: Max to dependent  Community mobility: does not drive, family provides transportation, ambulates with rolling walker  Medication management: TBA Financial management: TBA Handwriting: 25% legible, demonstrates severe micrographia  MOBILITY STATUS: Needs Assist:  assist with sit to stand transfer, ambulates with RW, difficulty with turns, and start hesitation, freezing of gait noted.  POSTURE COMMENTS:  rounded shoulders and forward head  ACTIVITY TOLERANCE: Activity tolerance: Fair, patient requires frequent rest breaks during evaluation and during mobility tasks  FUNCTIONAL OUTCOME MEASURES: FOTO 6 min walk test 350 feet 5 times sit to stand unable to perform sit to stand without use of arms BERG balance: TBA next session  COORDINATION: Finger Nose Finger test: impaired  9 Hole Peg test: Right: 50 sec; Left: 56 sec  UE/LE ROM:   Pt with limited ROM of bilateral UEs, LE:  right knee extension full, left 30 degrees of knee extension and limited dorsiflexion of knee  UE MMT:    3/5 overall strength in UE, LE:  right 3/5, left 2-/5  MUSCLE TONE: Increased muscle tone L greater than R, UE greater than LE.    SENSATION: WFL  COGNITION: Overall cognitive status: Within functional limits for tasks assessed  OBSERVATIONS: Bradykinesia   TODAY'S TREATMENT:                                                                                                                              DATE: 05/28/2022  Neuromuscular Reeducation:  Patient seen for initial instruction of LSVT BIG exercises: LSVT Daily Session Maximal Daily Exercises: Sustained movements are designed to rescale the amplitude of movement output for generalization to daily functional activities. Performed as follows for 1 set of 10 repetitions each: Multi directional sustained movements- 1) Floor to ceiling, 2) Side to side. Multi directional Repetitive movements performed in standing and are designed to provide retraining effort needed for sustained muscle activation in tasks Performed as follows: 3) Step and reach forward, 4) Step and Reach Backwards, 5) Step and reach sideways, 6) Rock and reach forward/backward, 7) Rock and reach sideways. Sit to stand from mat table on lowest setting  with cues for weight shift, technique and min to mod assist for 10 reps for 1 set.    All exercises were performed in an ADAPTED version of the LSVT BIG protocol for maximal daily exercises.  The adapted version utilizes a chair to assist with balance.  Pt will  not be able at this time to perform exercises at home in the standard version and will need to follow instructions and guidelines for the adapted version as well as assist from family for balance as needed.    Functional mobility: Pt seen for functional mobility skills with use of rolling walker for 2 trials of 100 feet each with cues for increasing amplitude of stepping patterns as well as posture during mobility, therapist providing min assist to min guard and use of gait belt.  Pt tends to have a forward flexed posture which affects her center of gravity and resulting in decreased balance patterns.    BERG balance test:  Pt scoring 21/56, indicating fall risk and the need for assistance to move around safely.    PATIENT EDUCATION: Education details: LSVT BIG program, POC, goals, role of OT Person educated: Patient Education method: Explanation Education comprehension: verbalized understanding  HOME EXERCISE PROGRAM: Will initiate home program next session with implementation of LSVT BIG maximal daily exercises  GOALS: Goals reviewed with patient? Yes  SHORT TERM GOALS: Target date: 06/08/2022  Pt will demonstrate sit to stand from chair with modified independence, using arm rest as needed to push up from chair.  Baseline: min to mod assist Goal status: INITIAL   LONG TERM GOALS: Target date: 06/29/2022  Patient will improve gait speed and endurance to be able to ambulate 500 feet in 6 minutes to negotiate around the home and community safely in 4 weeks.  Baseline: Eval 350 feet  Goal status: INITIAL  2.  Patient will complete HEP for maximal daily exercises with min to mod assist in 4 weeks.  Baseline: Eval-no current  program Goal status: INITIAL  3.  Patient will transfer from to sit to stand safely and independently from a variety of chairs/surfaces in 4 weeks.  Baseline: min to mod assist for sit to stand Goal status: INITIAL  4.  Pt will complete dressing with min assist  Baseline: mod to max assist at eval  Goal status: INITIAL  5.  Pt will complete bathing with min assist  Baseline: max assist Goal status: INITIAL  6.  Pt will complete standing at the sink for up to 5 mins to wash dishes with SBA. Baseline: Difficulty standing to wash dishes, can stand 1-3 mins Goal status: INITIAL ASSESSMENT:  CLINICAL IMPRESSION: Pt seen for initial instruction of LSVT BIG exercises this date with therapist assessment of what version of maximal daily exercises will be best for patient.  Therapist proceeded with adapted version of exercises which require the use of a chair for balance when engaging in maximal daily exercises which require standing.  Pt able to engage in exercises with min to mod assist from therapist for proper form and technique as well as for balance to perform exercises safely.  Will defer giving patient HEP this date until we can perform 1-2 more sessions to further assess and to train caregivers on how to assist with home program.     PERFORMANCE DEFICITS: in functional skills including ADLs, IADLs, coordination, dexterity, ROM, strength, flexibility, Fine motor control, Gross motor control, mobility, balance, endurance, decreased knowledge of use of DME, and UE functional use, cognitive skills including attention and memory, and psychosocial skills including environmental adaptation, habits, and routines and behaviors.   IMPAIRMENTS: are limiting patient from ADLs, IADLs, leisure, and social participation.   COMORBIDITIES:  has co-morbidities such as orthostatic hypotension, CAD, syncope, knee pain, unsteady gait, fall risk, COPD, osteoporosis,   that  affects occupational performance.  Patient will benefit from skilled OT to address above impairments and improve overall function.  MODIFICATION OR ASSISTANCE TO COMPLETE EVALUATION: Min-Moderate modification of tasks or assist with assess necessary to complete an evaluation.  OT OCCUPATIONAL PROFILE AND HISTORY: Detailed assessment: Review of records and additional review of physical, cognitive, psychosocial history related to current functional performance.  CLINICAL DECISION MAKING: High - multiple treatment options, significant modification of task necessary  REHAB POTENTIAL: Good  EVALUATION COMPLEXITY: High    PLAN:  OT FREQUENCY: 4x/week  OT DURATION: 4 weeks  PLANNED INTERVENTIONS: self care/ADL training, therapeutic exercise, therapeutic activity, neuromuscular re-education, manual therapy, passive range of motion, gait training, balance training, stair training, and functional mobility training  RECOMMENDED OTHER SERVICES: LSVT LOUD with SLP  CONSULTED AND AGREED WITH PLAN OF CARE: Patient and family member/caregiver  PLAN FOR NEXT SESSION: Will educate and perform maximal daily exercises, BERG balance assessment   Anneka Studer T [facility] [place], OTR/L, CLT  Desirae Mancusi, OT 06/06/2022, 4:34 PM

## 2022-06-06 NOTE — Therapy (Signed)
OUTPATIENT OCCUPATIONAL THERAPY PARKINSON'S TREATMENT  Patient Name: Victoria Hanson MRN: [id number] DOB:23-Dec-1950, 71 y.o., female Today's Date: 05/30/2022  PCP: Einar Pheasant REFERRING PROVIDER: Alonza Bogus   END OF SESSION:  OT End of Session - 06/06/22 1801     Visit Number 4    Number of Visits 17    Date for OT Re-Evaluation 06/29/22    OT Start Time 1058    OT Stop Time 1200    OT Time Calculation (min) 62 min    Activity Tolerance Patient tolerated treatment well    Behavior During Therapy [facility] for tasks assessed/performed             Past Medical History:  Diagnosis Date   Anxiety    Asthma    Atypical chest pain    a. 2003 Cath: reportedly nl per pt [name]);  b. 04/2015 [facility] Admission, neg troponin.   Colon cancer ([place]) 1999   a. 1999: Pt had partial colectomy. Did develop lung metastasis. Had right upper lobectomy in 01/2007 then had associated chemotherapy.   COPD (chronic obstructive pulmonary disease) (HCC)    DDD (degenerative disc disease), lumbosacral    Depression    Echocardiogram abnormal    a. 02/2010 Echo: EF 55-60%, mild LAE, no regional wall motion abnormalities   Elevated transaminase level    a. ? NASH;  b. 06/2015 - nl LFTs.   GERD (gastroesophageal reflux disease)    Hematuria    Holter monitor, abnormal    a. 12/2009 Holter monitoring: NSR, rare PACs and PVCs.   Hypercholesterolemia    Hyperlipidemia    Hypertension    Leaky heart valve    a. Per pt report - no evidence of valvular abnormalities on prior echoes.   Lesion of skin of Right breast 10/20/2017   Lung metastases 2008   a. S/P resection (lobectomy - right)   Morbid obesity ([place])    Nephrolithiasis    pt denies   Parkinson disease 08/2016   [name]   Past Surgical History:  Procedure Laterality Date   BREAST EXCISIONAL BIOPSY Right 10/07/2009   benign   CARDIAC CATHETERIZATION  2003   negative as per pt report   CHOLECYSTECTOMY  2000   COLON SURGERY      Colon cancer, removed part of large colon   COLONOSCOPY N/A 04/25/2015   Procedure: COLONOSCOPY;  Surgeon: Manya Silvas, MD;  Location: [place];  Service: Endoscopy;  Laterality: N/A;   COLONOSCOPY WITH PROPOFOL N/A 06/14/2017   Procedure: COLONOSCOPY WITH PROPOFOL;  Surgeon: Manya Silvas, MD;  Location[facility]COPY;  Service: Endoscopy;  Laterality: N/A;   COLONOSCOPY WITH PROPOFOL N/A 11/17/2019   Procedure: COLONOSCOPY WITH PROPOFOL;  Surgeon: [name], MD;  Location: [facility] ENDOSCOPY;  Service: Gastroenterology;  Laterality: N/A;   COLONOSCOPY WITH PROPOFOL N/A 03/21/2021   Procedure: COLONOSCOPY WITH PROPOFOL;  Surgeon: Lesly Rubenstein, MD;  Location: [facility] ENDOSCOPY;  Service: Endoscopy;  Laterality: N/A;   ESOPHAGOGASTRODUODENOSCOPY (EGD) WITH PROPOFOL N/A 03/21/2021   Procedure: ESOPHAGOGASTRODUODENOSCOPY (EGD) WITH PROPOFOL;  Surgeon: Lesly Rubenstein, MD;  Location: [facility] ENDOSCOPY;  Service: Endoscopy;  Laterality: N/A;   LOBECTOMY  01/2007   Right lung, upper lobe right side removed   TOTAL ABDOMINAL HYSTERECTOMY     abnormal bleeding   TUBAL LIGATION  1980   Patient Active Problem List   Diagnosis Date Noted   Hypophonia 05/23/2022   Left knee pain 05/02/2022   Parkinson disease 03/12/2022   Change in  bowel movement 02/05/2022   Lower extremity edema 01/30/2022   Cloudy urine 11/29/2021   History of total knee arthroplasty 09/13/2021   Hepatic steatosis 09/13/2021   Urinary frequency 03/26/2021   Right knee pain 01/01/2021   UTI (urinary tract infection) 11/27/2020   Vitamin D deficiency 11/05/2020   Aortic atherosclerosis ([place]) 11/05/2020   Dizziness 08/24/2020   Cardiomegaly 08/24/2020   Constipation 11/11/2019   Anemia 11/11/2019   Near syncope 02/13/2019   Fibromuscular dysplasia ([place]) 02/02/2019   Abdominal bloating 01/04/2019   Unsteady gait 12/03/2018   History of UTI 10/08/2018   Ear fullness, right 10/08/2018   B12 deficiency  03/11/2018   Goals of care, counseling/discussion 10/15/2017   Orthostatic hypotension due to Parkinson's disease ([place]) 07/31/2017   COPD exacerbation ([place]) 07/31/2017   REM behavioral disorder 05/28/2017   Elevated liver function tests 08/25/2016   Carotid artery disease ([place]) 07/19/2016   Tremor 06/01/2016   Osteoporosis 04/08/2016   Foot pain, bilateral 07/24/2015   Head pain 07/24/2015   Hyperlipidemia 06/21/2015   Other fatigue 06/21/2015   BMI 33.0-33.9,adult 06/21/2015   Disorder of kidney and ureter 03/28/2015   Hyperglycemia 03/20/2015   Swelling of lower extremity 11/20/2014   Dysuria 10/24/2014   Health care maintenance 07/17/2014   Hypokalemia 04/05/2013   TIA (transient ischemic attack) 02/09/2013   Transient cerebral ischemia 02/09/2013   Anxiety, mild 03/13/2012   Depression, recurrent ([place]) 03/13/2012   History of colon cancer 03/13/2012   Hypertension 03/13/2012   CKD (chronic kidney disease), stage III ([place]) 03/13/2012   GERD (gastroesophageal reflux disease) 03/13/2012   Acid reflux 03/13/2012   History of malignant neoplasm metastatic to lung 03/13/2012   Major depressive disorder with single episode 03/13/2012   Hypercholesterolemia 02/02/2010   SOB (shortness of breath) on exertion 02/02/2010   Pure hypercholesterolemia 02/02/2010    ONSET DATE: 05/23/2022, diagnosed with Parkinson's in 2018  REFERRING DIAG: Parkinson's disease, LSVT BIG  THERAPY DIAG:  Muscle weakness (generalized)  Other lack of coordination  Abnormal posture  Unsteadiness on feet  Difficulty in walking, not elsewhere classified  Rationale for Evaluation and Treatment: Rehabilitation  SUBJECTIVE:   SUBJECTIVE STATEMENT:  Pt reports she is doing well, she is happy she decided to do therapy and feels she is already doing much better this week than at her evaluation.  Daughter present during session for education on home exercise program and reports her family can already  see a difference in patients movement and transfers.  Pt accompanied by: family member  PERTINENT HISTORY: Pt diagnosed with Parkinson's disease in 2018.  Pt with multiple visits to the ER this year with BP issues, UTI, weakness and changes in medication.  Pt requiring increased assistance from family with all self care and mobility.  She had a knee replacement on the left March 2023.  Tremors in bilateral hands.Pt has a history of hypotension.  PRECAUTIONS: Fall, Pt has a history of Neurogenic Orthostatic Hypotension  WEIGHT BEARING RESTRICTIONS: No  PAIN:  Are you having pain? No  FALLS: Has patient fallen in last 6 months? No  LIVING ENVIRONMENT: Lives with: lives with their family Lives in: House/apartment Stairs: No Has following equipment at home: Gilford Rile - 2 wheeled, shower chair, bed side commode, and Grab bars  PLOF: Needs assistance with ADLs, Needs assistance with homemaking, and Needs assistance with transfers  PATIENT GOALS: Pt reports she would like to be as independent as possible, do more for herself  OBJECTIVE:   HAND DOMINANCE:  Right  ADLs: Overall ADLs: Pt reports her daughter helps her with bathing and dressing Transfers/ambulation related to ADLs: Eating: Able to feed self independently Grooming: Able to complete grooming with modified independence, difficulty with standing for long periods of time UB Dressing: min to mod assist LB Dressing: mod to max assist Toileting: min to mod assist  Bathing: max assist Tub Shower transfers: mod assist, has shower chair and grab bar in shower. Equipment: Shower seat with back, Grab bars, and bed side commode  IADLs: Shopping: Dependent Light housekeeping: Reports she tries to help wash dishes but cannot stand very long Meal Prep: Max to dependent  Community mobility: does not drive, family provides transportation, ambulates with rolling walker  Medication management: TBA Financial management: TBA Handwriting:  25% legible, demonstrates severe micrographia  MOBILITY STATUS: Needs Assist: assist with sit to stand transfer, ambulates with RW, difficulty with turns, and start hesitation, freezing of gait noted.  POSTURE COMMENTS:  rounded shoulders and forward head  ACTIVITY TOLERANCE: Activity tolerance: Fair, patient requires frequent rest breaks during evaluation and during mobility tasks  FUNCTIONAL OUTCOME MEASURES: FOTO 6 min walk test 350 feet 5 times sit to stand unable to perform sit to stand without use of arms BERG balance: TBA next session  COORDINATION: Finger Nose Finger test: impaired  9 Hole Peg test: Right: 50 sec; Left: 56 sec  UE/LE ROM:   Pt with limited ROM of bilateral UEs, LE:  right knee extension full, left 30 degrees of knee extension and limited dorsiflexion of knee  UE MMT:    3/5 overall strength in UE, LE:  right 3/5, left 2-/5  MUSCLE TONE: Increased muscle tone L greater than R, UE greater than LE.    SENSATION: WFL  COGNITION: Overall cognitive status: Within functional limits for tasks assessed  OBSERVATIONS: Bradykinesia   TODAY'S TREATMENT:                                                                                                                              DATE: 05/30/2022  Neuromuscular Reeducation:  Patient seen for initial instruction of LSVT BIG exercises: LSVT Daily Session Maximal Daily Exercises: Sustained movements are designed to rescale the amplitude of movement output for generalization to daily functional activities. Performed as follows for 1 set of 10 repetitions each: Multi directional sustained movements- 1) Floor to ceiling, 2) Side to side. Multi directional Repetitive movements performed in standing and are designed to provide retraining effort needed for sustained muscle activation in tasks Performed as follows: 3) Step and reach forward, 4) Step and Reach Backwards, 5) Step and reach sideways, 6) Rock and reach  forward/backward, 7) Rock and reach sideways. Sit to stand from mat table on lowest setting with cues for weight shift, technique and min to mod assist for 10 reps for 1 set.    All exercises were performed in an ADAPTED version of the LSVT BIG protocol for maximal daily exercises.  The adapted version utilizes a chair to assist with balance.  Pt requiring min assist for exercises in standing for transitional movements and maintaining balance.  Pt requires therapist demo, verbal cues and tactile cues for proper form and technique but is becoming more familiar with exercises each session.  Daughter present today during session and was educated on HEP of maximal daily exercises to be able to assist patient at home with her home program.  Issued written/pictorial exercises for reference at home.  Pt and daughter both demonstrate understanding of exercises and the need for a use of a chair to perform exercises in an adapted version.   Posture exercises in sitting this date, cues for shoulder retraction and promotion of upright posture.    Functional mobility: Pt seen for mobility on even surface, utilizing rolling walker.  Cues for upright posture and to increase amplitude of steps with forward motion.  Therapist providing min assist to min guard.  Ambulated 1 trial of 125 feet, 2nd trial of 100 feet, pt required 1 rest break seated during task.   Car transfer with min assist and cues managing lower extremities to get into vehicle.    PATIENT EDUCATION: Education details: LSVT BIG program, POC, goals, role of OT Person educated: Patient Education method: Explanation Education comprehension: verbalized understanding  HOME EXERCISE PROGRAM: Will initiate home program next session with implementation of LSVT BIG maximal daily exercises  GOALS: Goals reviewed with patient? Yes  SHORT TERM GOALS: Target date: 06/08/2022  Pt will demonstrate sit to stand from chair with modified independence, using  arm rest as needed to push up from chair.  Baseline: min to mod assist Goal status: INITIAL   LONG TERM GOALS: Target date: 06/29/2022  Patient will improve gait speed and endurance to be able to ambulate 500 feet in 6 minutes to negotiate around the home and community safely in 4 weeks.  Baseline: Eval 350 feet  Goal status: INITIAL  2.  Patient will complete HEP for maximal daily exercises with min to mod assist in 4 weeks.  Baseline: Eval-no current program Goal status: INITIAL  3.  Patient will transfer from to sit to stand safely and independently from a variety of chairs/surfaces in 4 weeks.  Baseline: min to mod assist for sit to stand Goal status: INITIAL  4.  Pt will complete dressing with min assist  Baseline: mod to max assist at eval  Goal status: INITIAL  5.  Pt will complete bathing with min assist  Baseline: max assist Goal status: INITIAL  6.  Pt will complete standing at the sink for up to 5 mins to wash dishes with SBA. Baseline: Difficulty standing to wash dishes, can stand 1-3 mins Goal status: INITIAL ASSESSMENT:  CLINICAL IMPRESSION: Pt progressing well this week with instruction of maximal daily exercises.  Daughter came in for therapy session this date for instruction on HEP to be able to assist patient at home.  Pt and daughter demonstrate understanding of home exercise program and written/pictorial instructions.  Pt to initiate 2nd set of exercises at home this evening with family assist.  Pt with significant improvement with sit to stand this week and requiring min assist to min guard, along with verbal cues.  Implemented posture exercises during functional mobility and throughout session seated and standing.      PERFORMANCE DEFICITS: in functional skills including ADLs, IADLs, coordination, dexterity, ROM, strength, flexibility, Fine motor control, Gross motor control, mobility, balance, endurance, decreased knowledge of use of DME,  and UE functional  use, cognitive skills including attention and memory, and psychosocial skills including environmental adaptation, habits, and routines and behaviors.   IMPAIRMENTS: are limiting patient from ADLs, IADLs, leisure, and social participation.   COMORBIDITIES:  has co-morbidities such as orthostatic hypotension, CAD, syncope, knee pain, unsteady gait, fall risk, COPD, osteoporosis,   that affects occupational performance. Patient will benefit from skilled OT to address above impairments and improve overall function.  MODIFICATION OR ASSISTANCE TO COMPLETE EVALUATION: Min-Moderate modification of tasks or assist with assess necessary to complete an evaluation.  OT OCCUPATIONAL PROFILE AND HISTORY: Detailed assessment: Review of records and additional review of physical, cognitive, psychosocial history related to current functional performance.  CLINICAL DECISION MAKING: High - multiple treatment options, significant modification of task necessary  REHAB POTENTIAL: Good  EVALUATION COMPLEXITY: High    PLAN:  OT FREQUENCY: 4x/week  OT DURATION: 4 weeks  PLANNED INTERVENTIONS: self care/ADL training, therapeutic exercise, therapeutic activity, neuromuscular re-education, manual therapy, passive range of motion, gait training, balance training, stair training, and functional mobility training  RECOMMENDED OTHER SERVICES: LSVT LOUD with SLP  CONSULTED AND AGREED WITH PLAN OF CARE: Patient and family member/caregiver  PLAN FOR NEXT SESSION: Will continue with maximal daily exercises, posture exercises, add functional component tasks.  Sydelle Sherfield Oneita Jolly, OTR/L, CLT  Priyanka Causey, OT 06/06/2022, 6:16 PM

## 2022-06-07 ENCOUNTER — Ambulatory Visit: Payer: Medicare Other | Admitting: Occupational Therapy

## 2022-06-07 DIAGNOSIS — R262 Difficulty in walking, not elsewhere classified: Secondary | ICD-10-CM

## 2022-06-07 DIAGNOSIS — R2681 Unsteadiness on feet: Secondary | ICD-10-CM | POA: Diagnosis not present

## 2022-06-07 DIAGNOSIS — M6281 Muscle weakness (generalized): Secondary | ICD-10-CM | POA: Diagnosis not present

## 2022-06-07 DIAGNOSIS — R293 Abnormal posture: Secondary | ICD-10-CM

## 2022-06-07 DIAGNOSIS — R278 Other lack of coordination: Secondary | ICD-10-CM

## 2022-06-07 NOTE — Progress Notes (Signed)
Weakness can indicate covid or influenza even though she did not have a cough

## 2022-06-08 ENCOUNTER — Encounter: Payer: Self-pay | Admitting: Occupational Therapy

## 2022-06-08 ENCOUNTER — Ambulatory Visit: Payer: Medicare Other | Admitting: Occupational Therapy

## 2022-06-08 DIAGNOSIS — R262 Difficulty in walking, not elsewhere classified: Secondary | ICD-10-CM

## 2022-06-08 DIAGNOSIS — R2681 Unsteadiness on feet: Secondary | ICD-10-CM

## 2022-06-08 DIAGNOSIS — R293 Abnormal posture: Secondary | ICD-10-CM | POA: Diagnosis not present

## 2022-06-08 DIAGNOSIS — R278 Other lack of coordination: Secondary | ICD-10-CM

## 2022-06-08 DIAGNOSIS — M6281 Muscle weakness (generalized): Secondary | ICD-10-CM

## 2022-06-08 NOTE — Therapy (Signed)
OUTPATIENT OCCUPATIONAL THERAPY [facility]  Patient Name: Victoria Hanson MRN: [id number] DOB:1951-02-05, 71 y.o., female Today's Date: 06/07/2022  PCP: Einar Pheasant REFERRING PROVIDER: Alonza Bogus   END OF SESSION:  OT End of Session - 06/08/22 1053     Visit Number 7    Number of Visits 17    Date for OT Re-Evaluation 06/29/22    OT Start Time 1301    OT Stop Time 1400    OT Time Calculation (min) 59 min    Activity Tolerance Patient tolerated treatment well    Behavior During Therapy [facility] for tasks assessed/performed             Past Medical History:  Diagnosis Date   Anxiety    Asthma    Atypical chest pain    a. 2003 Cath: reportedly nl per pt [name]);  b. 04/2015 [facility] Admission, neg troponin.   Colon cancer ([place]) 1999   a. 1999: Pt had partial colectomy. Did develop lung metastasis. Had right upper lobectomy in 01/2007 then had associated chemotherapy.   COPD (chronic obstructive pulmonary disease) (HCC)    DDD (degenerative disc disease), lumbosacral    Depression    Echocardiogram abnormal    a. 02/2010 Echo: EF 55-60%, mild LAE, no regional wall motion abnormalities   Elevated transaminase level    a. ? NASH;  b. 06/2015 - nl LFTs.   GERD (gastroesophageal reflux disease)    Hematuria    Holter monitor, abnormal    a. 12/2009 Holter monitoring: NSR, rare PACs and PVCs.   Hypercholesterolemia    Hyperlipidemia    Hypertension    Leaky heart valve    a. Per pt report - no evidence of valvular abnormalities on prior echoes.   Lesion of skin of Right breast 10/20/2017   Lung metastases 2008   a. S/P resection (lobectomy - right)   Morbid obesity ([place])    Nephrolithiasis    pt denies   Parkinson disease 08/2016   [name]   Past Surgical History:  Procedure Laterality Date   BREAST EXCISIONAL BIOPSY Right 10/07/2009   benign   CARDIAC CATHETERIZATION  2003   negative as per pt report   CHOLECYSTECTOMY  2000   COLON SURGERY      Colon cancer, removed part of large colon   COLONOSCOPY N/A 04/25/2015   Procedure: COLONOSCOPY;  Surgeon: Manya Silvas, MD;  Location: [place];  Service: Endoscopy;  Laterality: N/A;   COLONOSCOPY WITH PROPOFOL N/A 06/14/2017   Procedure: COLONOSCOPY WITH PROPOFOL;  Surgeon: Manya Silvas, MD;  Location: [facility] ENDOSCOPY;  Service: Endoscopy;  Laterality: N/A;   COLONOSCOPY WITH PROPOFOL N/A 11/17/2019   Procedure: COLONOSCOPY WITH PROPOFOL;  Surgeon: [name], MD;  Location: [facility] ENDOSCOPY;  Service: Gastroenterology;  Laterality: N/A;   COLONOSCOPY WITH PROPOFOL N/A 03/21/2021   Procedure: COLONOSCOPY WITH PROPOFOL;  Surgeon: Lesly Rubenstein, MD;  Location: [facility] ENDOSCOPY;  Service: Endoscopy;  Laterality: N/A;   ESOPHAGOGASTRODUODENOSCOPY (EGD) WITH PROPOFOL N/A 03/21/2021   Procedure: ESOPHAGOGASTRODUODENOSCOPY (EGD) WITH PROPOFOL;  Surgeon: Lesly Rubenstein, MD;  Location: [facility] ENDOSCOPY;  Service: Endoscopy;  Laterality: N/A;   LOBECTOMY  01/2007   Right lung, upper lobe right side removed   TOTAL ABDOMINAL HYSTERECTOMY     abnormal bleeding   TUBAL LIGATION  1980   Patient Active Problem List   Diagnosis Date Noted   Hypophonia 05/23/2022   Left knee pain 05/02/2022   Parkinson disease 03/12/2022   Change in  bowel movement 02/05/2022   Lower extremity edema 01/30/2022   Cloudy urine 11/29/2021   History of total knee arthroplasty 09/13/2021   Hepatic steatosis 09/13/2021   Urinary frequency 03/26/2021   Right knee pain 01/01/2021   UTI (urinary tract infection) 11/27/2020   Vitamin D deficiency 11/05/2020   Aortic atherosclerosis ([place]) 11/05/2020   Dizziness 08/24/2020   Cardiomegaly 08/24/2020   Constipation 11/11/2019   Anemia 11/11/2019   Near syncope 02/13/2019   Fibromuscular dysplasia ([place]) 02/02/2019   Abdominal bloating 01/04/2019   Unsteady gait 12/03/2018   History of UTI 10/08/2018   Ear fullness, right 10/08/2018   B12 deficiency  03/11/2018   Goals of care, counseling/discussion 10/15/2017   Orthostatic hypotension due to Parkinson's disease ([place]) 07/31/2017   COPD exacerbation ([place]) 07/31/2017   REM behavioral disorder 05/28/2017   Elevated liver function tests 08/25/2016   Carotid artery disease ([place]) 07/19/2016   Tremor 06/01/2016   Osteoporosis 04/08/2016   Foot pain, bilateral 07/24/2015   Head pain 07/24/2015   Hyperlipidemia 06/21/2015   Other fatigue 06/21/2015   BMI 33.0-33.9,adult 06/21/2015   Disorder of kidney and ureter 03/28/2015   Hyperglycemia 03/20/2015   Swelling of lower extremity 11/20/2014   Dysuria 10/24/2014   Health care maintenance 07/17/2014   Hypokalemia 04/05/2013   TIA (transient ischemic attack) 02/09/2013   Transient cerebral ischemia 02/09/2013   Anxiety, mild 03/13/2012   Depression, recurrent ([place]) 03/13/2012   History of colon cancer 03/13/2012   Hypertension 03/13/2012   CKD (chronic kidney disease), stage III ([place]) 03/13/2012   GERD (gastroesophageal reflux disease) 03/13/2012   Acid reflux 03/13/2012   History of malignant neoplasm metastatic to lung 03/13/2012   Major depressive disorder with single episode 03/13/2012   Hypercholesterolemia 02/02/2010   SOB (shortness of breath) on exertion 02/02/2010   Pure hypercholesterolemia 02/02/2010    ONSET DATE: 05/23/2022, diagnosed with Parkinson's in 2018  REFERRING DIAG: Parkinson's disease, LSVT BIG  THERAPY DIAG:  Muscle weakness (generalized)  Unsteadiness on feet  Other lack of coordination  Abnormal posture  Difficulty in walking, not elsewhere classified  Rationale for Evaluation and Treatment: Rehabilitation  SUBJECTIVE:   SUBJECTIVE STATEMENT: Pt reports she feels like she is making progress, "I am doing much better than when I started."    Pt accompanied by: family member  PERTINENT HISTORY: Pt diagnosed with Parkinson's disease in 2018.  Pt with multiple visits to the ER this year with  BP issues, UTI, weakness and changes in medication.  Pt requiring increased assistance from family with all self care and mobility.  She had a knee replacement on the left March 2023.  Tremors in bilateral hands.Pt has a history of hypotension.  PRECAUTIONS: Fall, Pt has a history of Neurogenic Orthostatic Hypotension  WEIGHT BEARING RESTRICTIONS: No  PAIN:  Are you having pain? No  FALLS: Has patient fallen in last 6 months? No  LIVING ENVIRONMENT: Lives with: lives with their family Lives in: House/apartment Stairs: No Has following equipment at home: Environmental consultant - 2 wheeled, shower chair, bed side commode, and Grab bars  PLOF: Needs assistance with ADLs, Needs assistance with homemaking, and Needs assistance with transfers  PATIENT GOALS: Pt reports she would like to be as independent as possible, do more for herself  OBJECTIVE:   HAND DOMINANCE: Right  ADLs: Overall ADLs: Pt reports her daughter helps her with bathing and dressing Transfers/ambulation related to ADLs: Eating: Able to feed self independently Grooming: Able to complete grooming with modified independence, difficulty  with standing for long periods of time UB Dressing: min to mod assist LB Dressing: mod to max assist Toileting: min to mod assist  Bathing: max assist Tub Shower transfers: mod assist, has shower chair and grab bar in shower. Equipment: Shower seat with back, Grab bars, and bed side commode  IADLs: Shopping: Dependent Light housekeeping: Reports she tries to help wash dishes but cannot stand very long Meal Prep: Max to dependent  Community mobility: does not drive, family provides transportation, ambulates with rolling walker  Medication management: TBA Financial management: TBA Handwriting: 25% legible, demonstrates severe micrographia  MOBILITY STATUS: Needs Assist: assist with sit to stand transfer, ambulates with RW, difficulty with turns, and start hesitation, freezing of gait  noted.  POSTURE COMMENTS:  rounded shoulders and forward head  ACTIVITY TOLERANCE: Activity tolerance: Fair, patient requires frequent rest breaks during evaluation and during mobility tasks  FUNCTIONAL OUTCOME MEASURES: FOTO 6 min walk test 350 feet 5 times sit to stand unable to perform sit to stand without use of arms BERG balance: TBA next session  COORDINATION: Finger Nose Finger test: impaired  9 Hole Peg test: Right: 50 sec; Left: 56 sec  UE/LE ROM:   Pt with limited ROM of bilateral UEs, LE:  right knee extension full, left 30 degrees of knee extension and limited dorsiflexion of knee  UE MMT:    3/5 overall strength in UE, LE:  right 3/5, left 2-/5  MUSCLE TONE: Increased muscle tone L greater than R, UE greater than LE.    SENSATION: WFL  COGNITION: Overall cognitive status: Within functional limits for tasks assessed  OBSERVATIONS: Bradykinesia   TODAY'S TREATMENT:                                                                                                                              DATE: 06/07/2022  Neuromuscular Reeducation:  Patient seen for initial instruction of LSVT BIG exercises: LSVT Daily Session Maximal Daily Exercises: Sustained movements are designed to rescale the amplitude of movement output for generalization to daily functional activities. Performed as follows for 1 set of 10 repetitions each: Multi directional sustained movements- 1) Floor to ceiling, 2) Side to side. Multi directional Repetitive movements performed in standing and are designed to provide retraining effort needed for sustained muscle activation in tasks Performed as follows: 3) Step and reach forward, 4) Step and Reach Backwards, 5) Step and reach sideways, 6) Rock and reach forward/backward, 7) Rock and reach sideways. Sit to stand from mat table on lowest setting with cues for weight shift, technique and min to mod assist for 10 reps for 1 set.    All exercises were performed  in an ADAPTED version of the LSVT BIG protocol for maximal daily exercises.  The adapted version utilizes a chair to assist with balance.  Pt requiring min assist for exercises in standing for transitional movements and maintaining balance.  Pt requires therapist demo, verbal cues and tactile  cues for proper form and technique but is becoming more familiar with exercises each session.   Posture exercises in sitting and standing at the wall  this date, cues for shoulder retraction and promotion of upright posture.    Functional mobility: Pt seen for mobility on even surface, utilizing rolling walker.  Cues for upright posture and to increase amplitude of steps with forward motion.  Therapist providing min guard during mobility.   During session she ambulated 1 trial of 175 feet, 2nd trial of 225 feet, pt required 1 rest break seated during task.   Car transfer with min assist and cues managing lower extremities to get into vehicle.  Stepping up and down on curb with min guard and cues for stepping patterns and walker placement.  Functional hand exercises this date: 7  hand exercises to help promote handwriting, dexterity and flexibility. 1)  Fisting with arm then arm extension with fingers extended with BIG hand movements 2) oppositional movements of the thumb to each digit 3) wrist flex/ext with elbows extended 4) supination/pronation 5) tendon gliding with hook fist and then finger extension 6) tendon gliding with tabletop movement of fingers with MP flexion, PIP and DIP extension 7) MP flexion, PIP flexion, DIP extension Performed with therapist demo and moderate cues for form  Functional component tasks: Manipulation of buttons for 5 reps each with increased time to complete task.   Stepping up and down from curb min guard and cues Sit to stand with min assist to min guard Handwriting with name demonstrates micrographia Car transfer with min assist   PATIENT EDUCATION: Education  details: LSVT BIG program, POC, goals, role of OT Person educated: Patient Education method: Explanation Education comprehension: verbalized understanding  HOME EXERCISE PROGRAM: Will initiate home program next session with implementation of LSVT BIG maximal daily exercises  GOALS: Goals reviewed with patient? Yes  SHORT TERM GOALS: Target date: 06/08/2022  Pt will demonstrate sit to stand from chair with modified independence, using arm rest as needed to push up from chair.  Baseline: min to mod assist Goal status: INITIAL   LONG TERM GOALS: Target date: 06/29/2022  Patient will improve gait speed and endurance to be able to ambulate 500 feet in 6 minutes to negotiate around the home and community safely in 4 weeks.  Baseline: Eval 350 feet  Goal status: INITIAL  2.  Patient will complete HEP for maximal daily exercises with min to mod assist in 4 weeks.  Baseline: Eval-no current program Goal status: INITIAL  3.  Patient will transfer from to sit to stand safely and independently from a variety of chairs/surfaces in 4 weeks.  Baseline: min to mod assist for sit to stand Goal status: INITIAL  4.  Pt will complete dressing with min assist  Baseline: mod to max assist at eval  Goal status: INITIAL  5.  Pt will complete bathing with min assist  Baseline: max assist Goal status: INITIAL  6.  Pt will complete standing at the sink for up to 5 mins to wash dishes with SBA. Baseline: Difficulty standing to wash dishes, can stand 1-3 mins Goal status: INITIAL ASSESSMENT:  CLINICAL IMPRESSION: Pt has continued to progress this week in all areas.  She is more familiar with exercises and able to recall a portion of exercises without use of handouts.  She continues to require cues and assist with maximal daily exercises but demonstrates improvements in balance during exercises this week.  Able to manage stepping up and down  from curb with min assist to min guard with use of walker.  Has continued to improve with amplitude of stepping patterns and able to ambulate a greater distance with decreased rest breaks.  Continue to work towards goals in [place].  PERFORMANCE DEFICITS: in functional skills including ADLs, IADLs, coordination, dexterity, ROM, strength, flexibility, Fine motor control, Gross motor control, mobility, balance, endurance, decreased knowledge of use of DME, and UE functional use, cognitive skills including attention and memory, and psychosocial skills including environmental adaptation, habits, and routines and behaviors.   IMPAIRMENTS: are limiting patient from ADLs, IADLs, leisure, and social participation.   COMORBIDITIES:  has co-morbidities such as orthostatic hypotension, CAD, syncope, knee pain, unsteady gait, fall risk, COPD, osteoporosis,   that affects occupational performance. Patient will benefit from skilled OT to address above impairments and improve overall function.  MODIFICATION OR ASSISTANCE TO COMPLETE EVALUATION: Min-Moderate modification of tasks or assist with assess necessary to complete an evaluation.  OT OCCUPATIONAL PROFILE AND HISTORY: Detailed assessment: Review of records and additional review of physical, cognitive, psychosocial history related to current functional performance.  CLINICAL DECISION MAKING: High - multiple treatment options, significant modification of task necessary  REHAB POTENTIAL: Good  EVALUATION COMPLEXITY: High    PLAN:  OT FREQUENCY: 4x/week  OT DURATION: 4 weeks  PLANNED INTERVENTIONS: self care/ADL training, therapeutic exercise, therapeutic activity, neuromuscular re-education, manual therapy, passive range of motion, gait training, balance training, stair training, and functional mobility training  RECOMMENDED OTHER SERVICES: LSVT LOUD with SLP  CONSULTED AND AGREED WITH PLAN OF CARE: Patient and family member/caregiver  PLAN FOR NEXT SESSION: Will continue with maximal daily exercises, posture  exercises, add functional component tasks.  Tabor Bartram Oneita Jolly, OTR/L, CLT  Inessa Wardrop, OT 06/08/2022, 6:49 PM

## 2022-06-08 NOTE — Therapy (Signed)
OUTPATIENT OCCUPATIONAL THERAPY [facility]  Patient Name: Victoria Hanson MRN: [id number] DOB:[birth year], 71 y.o., female Today's Date: 06/08/2022  PCP: Einar Pheasant REFERRING PROVIDER: Alonza Bogus   END OF SESSION:  OT End of Session - 06/08/22 1854     Visit Number 8    Number of Visits 17    Date for OT Re-Evaluation 06/29/22    OT Start Time 1140    OT Stop Time 1243    OT Time Calculation (min) 63 min    Activity Tolerance Patient tolerated treatment well    Behavior During Therapy [facility] for tasks assessed/performed             Past Medical History:  Diagnosis Date   Anxiety    Asthma    Atypical chest pain    a. 2003 Cath: reportedly nl per pt [name]);  b. 04/2015 [place] Admission, neg troponin.   Colon cancer ([place]) 1999   a. 1999: Pt had partial colectomy. Did develop lung metastasis. Had right upper lobectomy in 01/2007 then had associated chemotherapy.   COPD (chronic obstructive pulmonary disease) (HCC)    DDD (degenerative disc disease), lumbosacral    Depression    Echocardiogram abnormal    a. 02/2010 Echo: EF 55-60%, mild LAE, no regional wall motion abnormalities   Elevated transaminase level    a. ? NASH;  b. 06/2015 - nl LFTs.   GERD (gastroesophageal reflux disease)    Hematuria    Holter monitor, abnormal    a. 12/2009 Holter monitoring: NSR, rare PACs and PVCs.   Hypercholesterolemia    Hyperlipidemia    Hypertension    Leaky heart valve    a. Per pt report - no evidence of valvular abnormalities on prior echoes.   Lesion of skin of Right breast 10/20/2017   Lung metastases 2008   a. S/P resection (lobectomy - right)   Morbid obesity ([place])    Nephrolithiasis    pt denies   Parkinson disease 08/2016   [name]   Past Surgical History:  Procedure Laterality Date   BREAST EXCISIONAL BIOPSY Right 10/07/2009   benign   CARDIAC CATHETERIZATION  2003   negative as per pt report   CHOLECYSTECTOMY  2000   COLON SURGERY      Colon cancer, removed part of large colon   COLONOSCOPY N/A 04/25/2015   Procedure: COLONOSCOPY;  Surgeon: Manya Silvas, MD;  Location: [place];  Service: Endoscopy;  Laterality: N/A;   COLONOSCOPY WITH PROPOFOL N/A 06/14/2017   Procedure: COLONOSCOPY WITH PROPOFOL;  Surgeon: Manya Silvas, MD;  Location[facility] ENDOSCOPY;  Service: Endoscopy;  Laterality: N/A;   COLONOSCOPY WITH PROPOFOL N/A 11/17/2019   Procedure: COLONOSCOPY WITH PROPOFOL;  Surgeon: [name], MD;  Location: [facility] ENDOSCOPY;  Service: Gastroenterology;  Laterality: N/A;   COLONOSCOPY WITH PROPOFOL N/A 03/21/2021   Procedure: COLONOSCOPY WITH PROPOFOL;  Surgeon: Lesly Rubenstein, MD;  Location: [facility] ENDOSCOPY;  Service: Endoscopy;  Laterality: N/A;   ESOPHAGOGASTRODUODENOSCOPY (EGD) WITH PROPOFOL N/A 03/21/2021   Procedure: ESOPHAGOGASTRODUODENOSCOPY (EGD) WITH PROPOFOL;  Surgeon: Lesly Rubenstein, MD;  Location: [facility] ENDOSCOPY;  Service: Endoscopy;  Laterality: N/A;   LOBECTOMY  01/2007   Right lung, upper lobe right side removed   TOTAL ABDOMINAL HYSTERECTOMY     abnormal bleeding   TUBAL LIGATION  1980   Patient Active Problem List   Diagnosis Date Noted   Hypophonia 05/23/2022   Left knee pain 05/02/2022   Parkinson disease 03/12/2022   Change in  bowel movement 02/05/2022   Lower extremity edema 01/30/2022   Cloudy urine 11/29/2021   History of total knee arthroplasty 09/13/2021   Hepatic steatosis 09/13/2021   Urinary frequency 03/26/2021   Right knee pain 01/01/2021   UTI (urinary tract infection) 11/27/2020   Vitamin D deficiency 11/05/2020   Aortic atherosclerosis ([place]) 11/05/2020   Dizziness 08/24/2020   Cardiomegaly 08/24/2020   Constipation 11/11/2019   Anemia 11/11/2019   Near syncope 02/13/2019   Fibromuscular dysplasia ([place]) 02/02/2019   Abdominal bloating 01/04/2019   Unsteady gait 12/03/2018   History of UTI 10/08/2018   Ear fullness, right 10/08/2018   B12 deficiency  03/11/2018   Goals of care, counseling/discussion 10/15/2017   Orthostatic hypotension due to Parkinson's disease ([place]) 07/31/2017   COPD exacerbation ([place]) 07/31/2017   REM behavioral disorder 05/28/2017   Elevated liver function tests 08/25/2016   Carotid artery disease ([place]) 07/19/2016   Tremor 06/01/2016   Osteoporosis 04/08/2016   Foot pain, bilateral 07/24/2015   Head pain 07/24/2015   Hyperlipidemia 06/21/2015   Other fatigue 06/21/2015   BMI 33.0-33.9,adult 06/21/2015   Disorder of kidney and ureter 03/28/2015   Hyperglycemia 03/20/2015   Swelling of lower extremity 11/20/2014   Dysuria 10/24/2014   Health care maintenance 07/17/2014   Hypokalemia 04/05/2013   TIA (transient ischemic attack) 02/09/2013   Transient cerebral ischemia 02/09/2013   Anxiety, mild 03/13/2012   Depression, recurrent ([place]) 03/13/2012   History of colon cancer 03/13/2012   Hypertension 03/13/2012   CKD (chronic kidney disease), stage III ([place]) 03/13/2012   GERD (gastroesophageal reflux disease) 03/13/2012   Acid reflux 03/13/2012   History of malignant neoplasm metastatic to lung 03/13/2012   Major depressive disorder with single episode 03/13/2012   Hypercholesterolemia 02/02/2010   SOB (shortness of breath) on exertion 02/02/2010   Pure hypercholesterolemia 02/02/2010    ONSET DATE: 05/23/2022, diagnosed with Parkinson's in 2018  REFERRING DIAG: Parkinson's disease, LSVT BIG  THERAPY DIAG:  Muscle weakness (generalized)  Unsteadiness on feet  Other lack of coordination  Abnormal posture  Difficulty in walking, not elsewhere classified  Rationale for Evaluation and Treatment: Rehabilitation  SUBJECTIVE:   SUBJECTIVE STATEMENT: Pt reports she tried to do some exercises yesterday, she is aware she will need to perform exercises 2 times a day until she returns next week.    Pt accompanied by: family member  PERTINENT HISTORY: Pt diagnosed with Parkinson's disease in 2018.  Pt  with multiple visits to the ER this year with BP issues, UTI, weakness and changes in medication.  Pt requiring increased assistance from family with all self care and mobility.  She had a knee replacement on the left March 2023.  Tremors in bilateral hands.Pt has a history of hypotension.  PRECAUTIONS: Fall, Pt has a history of Neurogenic Orthostatic Hypotension  WEIGHT BEARING RESTRICTIONS: No  PAIN:  Are you having pain? No  FALLS: Has patient fallen in last 6 months? No  LIVING ENVIRONMENT: Lives with: lives with their family Lives in: House/apartment Stairs: No Has following equipment at home: Environmental consultant - 2 wheeled, shower chair, bed side commode, and Grab bars  PLOF: Needs assistance with ADLs, Needs assistance with homemaking, and Needs assistance with transfers  PATIENT GOALS: Pt reports she would like to be as independent as possible, do more for herself  OBJECTIVE:   HAND DOMINANCE: Right  ADLs: Overall ADLs: Pt reports her daughter helps her with bathing and dressing Transfers/ambulation related to ADLs: Eating: Able to feed self independently  Grooming: Able to complete grooming with modified independence, difficulty with standing for long periods of time UB Dressing: min to mod assist LB Dressing: mod to max assist Toileting: min to mod assist  Bathing: max assist Tub Shower transfers: mod assist, has shower chair and grab bar in shower. Equipment: Shower seat with back, Grab bars, and bed side commode  IADLs: Shopping: Dependent Light housekeeping: Reports she tries to help wash dishes but cannot stand very long Meal Prep: Max to dependent  Community mobility: does not drive, family provides transportation, ambulates with rolling walker  Medication management: TBA Financial management: TBA Handwriting: 25% legible, demonstrates severe micrographia  MOBILITY STATUS: Needs Assist: assist with sit to stand transfer, ambulates with RW, difficulty with turns, and  start hesitation, freezing of gait noted.  POSTURE COMMENTS:  rounded shoulders and forward head  ACTIVITY TOLERANCE: Activity tolerance: Fair, patient requires frequent rest breaks during evaluation and during mobility tasks  FUNCTIONAL OUTCOME MEASURES: FOTO 6 min walk test 350 feet 5 times sit to stand unable to perform sit to stand without use of arms BERG balance: TBA next session  COORDINATION: Finger Nose Finger test: impaired  9 Hole Peg test: Right: 50 sec; Left: 56 sec  UE/LE ROM:   Pt with limited ROM of bilateral UEs, LE:  right knee extension full, left 30 degrees of knee extension and limited dorsiflexion of knee  UE MMT:    3/5 overall strength in UE, LE:  right 3/5, left 2-/5  MUSCLE TONE: Increased muscle tone L greater than R, UE greater than LE.    SENSATION: WFL  COGNITION: Overall cognitive status: Within functional limits for tasks assessed  OBSERVATIONS: Bradykinesia   TODAY'S TREATMENT:                                                                                                                              DATE: 06/08/2022  Neuromuscular Reeducation:  Patient seen for initial instruction of LSVT BIG exercises: LSVT Daily Session Maximal Daily Exercises: Sustained movements are designed to rescale the amplitude of movement output for generalization to daily functional activities. Performed as follows for 1 set of 10 repetitions each: Multi directional sustained movements- 1) Floor to ceiling, 2) Side to side. Multi directional Repetitive movements performed in standing and are designed to provide retraining effort needed for sustained muscle activation in tasks Performed as follows: 3) Step and reach forward, 4) Step and Reach Backwards, 5) Step and reach sideways, 6) Rock and reach forward/backward, 7) Rock and reach sideways. Sit to stand from mat table on lowest setting with cues for weight shift, technique and min to mod assist for 10 reps for 1  set.    All exercises were performed in an ADAPTED version of the LSVT BIG protocol for maximal daily exercises.  The adapted version utilizes a chair to assist with balance.  Pt requiring min assist for exercises in standing for transitional movements and maintaining balance.  Pt requires therapist demo, verbal cues and tactile cues for proper form and technique but is becoming more familiar with exercises each session.   Posture exercises in sitting and standing at the wall, cues for shoulder retraction and promotion of upright posture.    Functional mobility: Pt seen for mobility on even surface, utilizing rolling walker.  Cues for upright posture and to increase amplitude of steps with forward motion.  Therapist providing min guard during mobility.   During session she ambulated 1 trial of 425 feet, no rest break this date with functional mobility.  Car transfer with min assist and cues managing lower extremities to get into vehicle.  Stepping up and down on curb with min guard and cues for stepping patterns and walker placement.  Functional hand exercises this date: 7  hand exercises to help promote handwriting, dexterity and flexibility. 1)  Fisting with arm then arm extension with fingers extended with BIG hand movements 2) oppositional movements of the thumb to each digit 3) wrist flex/ext with elbows extended 4) supination/pronation 5) tendon gliding with hook fist and then finger extension 6) tendon gliding with tabletop movement of fingers with MP flexion, PIP and DIP extension 7) MP flexion, PIP flexion, DIP extension Performed with therapist demo and moderate cues for form  Functional component tasks: Manipulation of buttons for 5 reps each with increased time to complete task.   Stepping up and down from curb min guard and cues Sit to stand with min assist to min guard Handwriting with name demonstrates micrographia Car transfer with min assist Scooting to edge of the chair  with cues and occasional min assist  Pt to continue with exercises twice a day over the weekend until she returns to therapy next week.     PATIENT EDUCATION: Education details: LSVT BIG program, POC, goals, role of OT Person educated: Patient Education method: Explanation Education comprehension: verbalized understanding  HOME EXERCISE PROGRAM: LSVT BIG maximal daily exercises, functional component tasks  GOALS: Goals reviewed with patient? Yes  SHORT TERM GOALS: Target date: 06/08/2022  Pt will demonstrate sit to stand from chair with modified independence, using arm rest as needed to push up from chair.  Baseline: min to mod assist Goal status: INITIAL   LONG TERM GOALS: Target date: 06/29/2022  Patient will improve gait speed and endurance to be able to ambulate 500 feet in 6 minutes to negotiate around the home and community safely in 4 weeks.  Baseline: Eval 350 feet  Goal status: INITIAL  2.  Patient will complete HEP for maximal daily exercises with min to mod assist in 4 weeks.  Baseline: Eval-no current program Goal status: INITIAL  3.  Patient will transfer from to sit to stand safely and independently from a variety of chairs/surfaces in 4 weeks.  Baseline: min to mod assist for sit to stand Goal status: INITIAL  4.  Pt will complete dressing with min assist  Baseline: mod to max assist at eval  Goal status: INITIAL  5.  Pt will complete bathing with min assist  Baseline: max assist Goal status: INITIAL  6.  Pt will complete standing at the sink for up to 5 mins to wash dishes with SBA. Baseline: Difficulty standing to wash dishes, can stand 1-3 mins Goal status: INITIAL ASSESSMENT:  CLINICAL IMPRESSION: Pt continues to demonstrate difficulty with shifting weight to step backwards with her maximal daily exercises.  She also demonstrates difficulty with opposition to small fingers bilaterally.  Therapist continues to provide  cues for exercises for proper  form and technique, balance is improving and patient able to complete exercises with min guard.  Min assist to min guard for managing curbs this week.  Pt reports increased engagement in daily tasks at home such as washing dishes and helping to care for her pet.  Continue to work towards calibration of movements, increasing amplitude of gait and balance.      PERFORMANCE DEFICITS: in functional skills including ADLs, IADLs, coordination, dexterity, ROM, strength, flexibility, Fine motor control, Gross motor control, mobility, balance, endurance, decreased knowledge of use of DME, and UE functional use, cognitive skills including attention and memory, and psychosocial skills including environmental adaptation, habits, and routines and behaviors.   IMPAIRMENTS: are limiting patient from ADLs, IADLs, leisure, and social participation.   COMORBIDITIES:  has co-morbidities such as orthostatic hypotension, CAD, syncope, knee pain, unsteady gait, fall risk, COPD, osteoporosis,   that affects occupational performance. Patient will benefit from skilled OT to address above impairments and improve overall function.  MODIFICATION OR ASSISTANCE TO COMPLETE EVALUATION: Min-Moderate modification of tasks or assist with assess necessary to complete an evaluation.  OT OCCUPATIONAL PROFILE AND HISTORY: Detailed assessment: Review of records and additional review of physical, cognitive, psychosocial history related to current functional performance.  CLINICAL DECISION MAKING: High - multiple treatment options, significant modification of task necessary  REHAB POTENTIAL: Good  EVALUATION COMPLEXITY: High    PLAN:  OT FREQUENCY: 4x/week  OT DURATION: 4 weeks  PLANNED INTERVENTIONS: self care/ADL training, therapeutic exercise, therapeutic activity, neuromuscular re-education, manual therapy, passive range of motion, gait training, balance training, stair training, and functional mobility  training  RECOMMENDED OTHER SERVICES: LSVT LOUD with SLP  CONSULTED AND AGREED WITH PLAN OF CARE: Patient and family member/caregiver  PLAN FOR NEXT SESSION: Will continue with maximal daily exercises, posture exercises, add functional component tasks.  Selda Jalbert Oneita Jolly, OTR/L, CLT  Adryanna Friedt, OT 06/09/2022, 6:22 PM

## 2022-06-12 ENCOUNTER — Encounter: Payer: Self-pay | Admitting: Occupational Therapy

## 2022-06-12 ENCOUNTER — Ambulatory Visit: Payer: 59 | Attending: Neurology | Admitting: Occupational Therapy

## 2022-06-12 DIAGNOSIS — R278 Other lack of coordination: Secondary | ICD-10-CM | POA: Diagnosis not present

## 2022-06-12 DIAGNOSIS — R262 Difficulty in walking, not elsewhere classified: Secondary | ICD-10-CM | POA: Insufficient documentation

## 2022-06-12 DIAGNOSIS — R293 Abnormal posture: Secondary | ICD-10-CM | POA: Diagnosis not present

## 2022-06-12 DIAGNOSIS — R2681 Unsteadiness on feet: Secondary | ICD-10-CM | POA: Diagnosis not present

## 2022-06-12 DIAGNOSIS — M6281 Muscle weakness (generalized): Secondary | ICD-10-CM | POA: Diagnosis not present

## 2022-06-12 NOTE — Therapy (Signed)
OUTPATIENT OCCUPATIONAL THERAPY PARKINSON'S TREATMENT  Patient Name: Victoria Hanson MRN: [id number] DOB:09/25/1950, 72 y.o., female Today's Date: 06/12/2022  PCP: Dale Perry REFERRING PROVIDER: Kerin Salen   END OF SESSION:  OT End of Session - 06/12/22 1550     Visit Number 9    Number of Visits 17    Date for OT Re-Evaluation 06/29/22    OT Start Time 1300    OT Stop Time 1401    OT Time Calculation (min) 61 min    Activity Tolerance Patient tolerated treatment well    Behavior During Therapy [facility] for tasks assessed/performed             Past Medical History:  Diagnosis Date   Anxiety    Asthma    Atypical chest pain    a. 2003 Cath: reportedly nl per pt [name]);  b. 04/2015 [facility] Admission, neg troponin.   Colon cancer (HCC) 1999   a. 1999: Pt had partial colectomy. Did develop lung metastasis. Had right upper lobectomy in 01/2007 then had associated chemotherapy.   COPD (chronic obstructive pulmonary disease) (HCC)    DDD (degenerative disc disease), lumbosacral    Depression    Echocardiogram abnormal    a. 02/2010 Echo: EF 55-60%, mild LAE, no regional wall motion abnormalities   Elevated transaminase level    a. ? NASH;  b. 06/2015 - nl LFTs.   GERD (gastroesophageal reflux disease)    Hematuria    Holter monitor, abnormal    a. 12/2009 Holter monitoring: NSR, rare PACs and PVCs.   Hypercholesterolemia    Hyperlipidemia    Hypertension    Leaky heart valve    a. Per pt report - no evidence of valvular abnormalities on prior echoes.   Lesion of skin of Right breast 10/20/2017   Lung metastases 2008   a. S/P resection (lobectomy - right)   Morbid obesity (HCC)    Nephrolithiasis    pt denies   Parkinson disease 08/2016   [name]   Past Surgical History:  Procedure Laterality Date   BREAST EXCISIONAL BIOPSY Right 10/07/2009   benign   CARDIAC CATHETERIZATION  2003   negative as per pt report   CHOLECYSTECTOMY  2000   COLON SURGERY      Colon cancer, removed part of large colon   COLONOSCOPY N/A 04/25/2015   Procedure: COLONOSCOPY;  Surgeon: Scot Jun, MD;  Location: [facility] ENDOSCOPY;  Service: Endoscopy;  Laterality: N/A;   COLONOSCOPY WITH PROPOFOL N/A 06/14/2017   Procedure: COLONOSCOPY WITH PROPOFOL;  Surgeon: Scot Jun, MD;  Location: [facility] ENDOSCOPY;  Service: Endoscopy;  Laterality: N/A;   COLONOSCOPY WITH PROPOFOL N/A 11/17/2019   Procedure: COLONOSCOPY WITH PROPOFOL;  Surgeon: Wyline Mood, MD;  Location: [facility] ENDOSCOPY;  Service: Gastroenterology;  Laterality: N/A;   COLONOSCOPY WITH PROPOFOL N/A 03/21/2021   Procedure: COLONOSCOPY WITH PROPOFOL;  Surgeon: Regis Bill, MD;  Location: [facility] ENDOSCOPY;  Service: Endoscopy;  Laterality: N/A;   ESOPHAGOGASTRODUODENOSCOPY (EGD) WITH PROPOFOL N/A 03/21/2021   Procedure: ESOPHAGOGASTRODUODENOSCOPY (EGD) WITH PROPOFOL;  Surgeon: Regis Bill, MD;  Location: [facility] ENDOSCOPY;  Service: Endoscopy;  Laterality: N/A;   LOBECTOMY  01/2007   Right lung, upper lobe right side removed   TOTAL ABDOMINAL HYSTERECTOMY     abnormal bleeding   TUBAL LIGATION  1980   Patient Active Problem List   Diagnosis Date Noted   Hypophonia 05/23/2022   Left knee pain 05/02/2022   Parkinson disease 03/12/2022   Change in  bowel movement 02/05/2022   Lower extremity edema 01/30/2022   Cloudy urine 11/29/2021   History of total knee arthroplasty 09/13/2021   Hepatic steatosis 09/13/2021   Urinary frequency 03/26/2021   Right knee pain 01/01/2021   UTI (urinary tract infection) 11/27/2020   Vitamin D deficiency 11/05/2020   Aortic atherosclerosis (HCC) 11/05/2020   Dizziness 08/24/2020   Cardiomegaly 08/24/2020   Constipation 11/11/2019   Anemia 11/11/2019   Near syncope 02/13/2019   Fibromuscular dysplasia (HCC) 02/02/2019   Abdominal bloating 01/04/2019   Unsteady gait 12/03/2018   History of UTI 10/08/2018   Ear fullness, right 10/08/2018   B12 deficiency  03/11/2018   Goals of care, counseling/discussion 10/15/2017   Orthostatic hypotension due to Parkinson's disease (HCC) 07/31/2017   COPD exacerbation (HCC) 07/31/2017   REM behavioral disorder 05/28/2017   Elevated liver function tests 08/25/2016   Carotid artery disease (HCC) 07/19/2016   Tremor 06/01/2016   Osteoporosis 04/08/2016   Foot pain, bilateral 07/24/2015   Head pain 07/24/2015   Hyperlipidemia 06/21/2015   Other fatigue 06/21/2015   BMI 33.0-33.9,adult 06/21/2015   Disorder of kidney and ureter 03/28/2015   Hyperglycemia 03/20/2015   Swelling of lower extremity 11/20/2014   Dysuria 10/24/2014   Health care maintenance 07/17/2014   Hypokalemia 04/05/2013   TIA (transient ischemic attack) 02/09/2013   Transient cerebral ischemia 02/09/2013   Anxiety, mild 03/13/2012   Depression, recurrent (HCC) 03/13/2012   History of colon cancer 03/13/2012   Hypertension 03/13/2012   CKD (chronic kidney disease), stage III (HCC) 03/13/2012   GERD (gastroesophageal reflux disease) 03/13/2012   Acid reflux 03/13/2012   History of malignant neoplasm metastatic to lung 03/13/2012   Major depressive disorder with single episode 03/13/2012   Hypercholesterolemia 02/02/2010   SOB (shortness of breath) on exertion 02/02/2010   Pure hypercholesterolemia 02/02/2010    ONSET DATE: 05/23/2022, diagnosed with Parkinson's in 2018  REFERRING DIAG: Parkinson's disease, LSVT BIG  THERAPY DIAG:  Muscle weakness (generalized)  Unsteadiness on feet  Other lack of coordination  Abnormal posture  Difficulty in walking, not elsewhere classified  Rationale for Evaluation and Treatment: Rehabilitation  SUBJECTIVE:   SUBJECTIVE STATEMENT:  Pt reports she is doing well, pleased with her progress so far but feels it is difficult to get her exercises done at home, lacks space for BIG movements.     PERTINENT HISTORY: Pt diagnosed with Parkinson's disease in 2018.  Pt with multiple visits  to the ER this year with BP issues, UTI, weakness and changes in medication.  Pt requiring increased assistance from family with all self care and mobility.  She had a knee replacement on the left March 2023.  Tremors in bilateral hands.Pt has a history of hypotension.  PRECAUTIONS: Fall, Pt has a history of Neurogenic Orthostatic Hypotension  WEIGHT BEARING RESTRICTIONS: No  PAIN:  Are you having pain? No  FALLS: Has patient fallen in last 6 months? No  LIVING ENVIRONMENT: Lives with: lives with their family Lives in: House/apartment Stairs: No Has following equipment at home: Environmental consultant - 2 wheeled, shower chair, bed side commode, and Grab bars  PLOF: Needs assistance with ADLs, Needs assistance with homemaking, and Needs assistance with transfers  PATIENT GOALS: Pt reports she would like to be as independent as possible, do more for herself  OBJECTIVE:   HAND DOMINANCE: Right  ADLs: Overall ADLs: Pt reports her daughter helps her with bathing and dressing Transfers/ambulation related to ADLs: Eating: Able to feed self independently Grooming: Able  to complete grooming with modified independence, difficulty with standing for long periods of time UB Dressing: min to mod assist LB Dressing: mod to max assist Toileting: min to mod assist  Bathing: max assist Tub Shower transfers: mod assist, has shower chair and grab bar in shower. Equipment: Shower seat with back, Grab bars, and bed side commode  IADLs: Shopping: Dependent Light housekeeping: Reports she tries to help wash dishes but cannot stand very long Meal Prep: Max to dependent  Community mobility: does not drive, family provides transportation, ambulates with rolling walker  Medication management: TBA Financial management: TBA Handwriting: 25% legible, demonstrates severe micrographia  MOBILITY STATUS: Needs Assist: assist with sit to stand transfer, ambulates with RW, difficulty with turns, and start hesitation,  freezing of gait noted.  POSTURE COMMENTS:  rounded shoulders and forward head  ACTIVITY TOLERANCE: Activity tolerance: Fair, patient requires frequent rest breaks during evaluation and during mobility tasks  FUNCTIONAL OUTCOME MEASURES: FOTO 6 min walk test 350 feet 5 times sit to stand unable to perform sit to stand without use of arms BERG balance: TBA next session  COORDINATION: Finger Nose Finger test: impaired  9 Hole Peg test: Right: 50 sec; Left: 56 sec  UE/LE ROM:   Pt with limited ROM of bilateral UEs, LE:  right knee extension full, left 30 degrees of knee extension and limited dorsiflexion of knee  UE MMT:    3/5 overall strength in UE, LE:  right 3/5, left 2-/5  MUSCLE TONE: Increased muscle tone L greater than R, UE greater than LE.    SENSATION: WFL  COGNITION: Overall cognitive status: Within functional limits for tasks assessed  OBSERVATIONS: Bradykinesia   TODAY'S TREATMENT:                                                                                                                              DATE: 06/12/2022  Neuromuscular Reeducation:  Patient seen for initial instruction of LSVT BIG exercises: LSVT Daily Session Maximal Daily Exercises: Sustained movements are designed to rescale the amplitude of movement output for generalization to daily functional activities. Performed as follows for 1 set of 10 repetitions each: Multi directional sustained movements- 1) Floor to ceiling, 2) Side to side. Multi directional Repetitive movements performed in standing and are designed to provide retraining effort needed for sustained muscle activation in tasks Performed as follows: 3) Step and reach forward, 4) Step and Reach Backwards, 5) Step and reach sideways, 6) Rock and reach forward/backward, 7) Rock and reach sideways. Sit to stand from mat table on lowest setting with cues for weight shift, technique and min to mod assist for 10 reps for 1 set.    All  exercises were performed in an ADAPTED version of the LSVT BIG protocol for maximal daily exercises.  The adapted version utilizes a chair to assist with balance.  Pt requiring min assist for exercises in standing for transitional movements and maintaining balance.  Pt  requires therapist demo, verbal cues and tactile cues for proper form and technique but is becoming more familiar with exercises each session.   Posture exercises in sitting and standing at the wall, cues for shoulder retraction and promotion of upright posture.    Functional mobility: Pt seen for mobility on even surface, utilizing rolling walker.  Cues for upright posture and to increase amplitude of steps with forward motion.  Therapist providing min guard during mobility.   During session she ambulated 1 trial of 600 feet, one rest break with functional mobility.  Car transfer with min assist to min guard and cues managing lower extremities to get into vehicle.  Stepping up and down on curb with min guard and cues for stepping patterns and walker placement.   7  hand exercises to help promote handwriting, dexterity and flexibility. 1)  Fisting with arm then arm extension with fingers extended with BIG hand movements 2) oppositional movements of the thumb to each digit 3) wrist flex/ext with elbows extended 4) supination/pronation with elbows bent 5) tendon gliding with hook fist and then finger extension 6) tendon gliding with tabletop movement of fingers with MP flexion, PIP and DIP extension 7) MP flexion, PIP flexion, DIP extension Performed with therapist demo and min cues for form  Functional component tasks: Manipulation of buttons for 5 reps each with increased time to complete task, completed regular buttons and metal ones, hand flicks prior to task. Stepping up and down from curb min guard and cues Sit to stand with min assist to min guard Handwriting with name on lined paper, noted improvements this date with letter  size Car transfer with min assist to min guard with cues Scooting to edge of the chair with cues     PATIENT EDUCATION: Education details: LSVT BIG program, POC, goals, role of OT Person educated: Patient Education method: Explanation Education comprehension: verbalized understanding  HOME EXERCISE PROGRAM: LSVT BIG maximal daily exercises, functional component tasks  GOALS: Goals reviewed with patient? Yes  SHORT TERM GOALS: Target date: 06/08/2022  Pt will demonstrate sit to stand from chair with modified independence, using arm rest as needed to push up from chair.  Baseline: min to mod assist Goal status: INITIAL   LONG TERM GOALS: Target date: 06/29/2022  Patient will improve gait speed and endurance to be able to ambulate 500 feet in 6 minutes to negotiate around the home and community safely in 4 weeks.  Baseline: Eval 350 feet  Goal status: INITIAL  2.  Patient will complete HEP for maximal daily exercises with min to mod assist in 4 weeks.  Baseline: Eval-no current program Goal status: INITIAL  3.  Patient will transfer from to sit to stand safely and independently from a variety of chairs/surfaces in 4 weeks.  Baseline: min to mod assist for sit to stand Goal status: INITIAL  4.  Pt will complete dressing with min assist  Baseline: mod to max assist at eval  Goal status: INITIAL  5.  Pt will complete bathing with min assist  Baseline: max assist Goal status: INITIAL  6.  Pt will complete standing at the sink for up to 5 mins to wash dishes with SBA. Baseline: Difficulty standing to wash dishes, can stand 1-3 mins Goal status: INITIAL ASSESSMENT:  CLINICAL IMPRESSION:   Pt progressing well in all areas, she demonstrates improvements this date with functional hand exercises, with recall of specific exercises and able to complete with decreased cues.  Handwriting sample this date  with significant improvement in letter size with using lined paper and cues  from therapist as a guide.  She required one rest break this date during functional mobility but was able to achieve greater distance with improved amplitude of steps.  Pt required decreased assist with car transfer and able to complete with min guard and cues.  Continue to work towards goals, will plan to perform Progress report next session with goal update and administration of objective and subjective outcome measures.      PERFORMANCE DEFICITS: in functional skills including ADLs, IADLs, coordination, dexterity, ROM, strength, flexibility, Fine motor control, Gross motor control, mobility, balance, endurance, decreased knowledge of use of DME, and UE functional use, cognitive skills including attention and memory, and psychosocial skills including environmental adaptation, habits, and routines and behaviors.   IMPAIRMENTS: are limiting patient from ADLs, IADLs, leisure, and social participation.   COMORBIDITIES:  has co-morbidities such as orthostatic hypotension, CAD, syncope, knee pain, unsteady gait, fall risk, COPD, osteoporosis,   that affects occupational performance. Patient will benefit from skilled OT to address above impairments and improve overall function.  MODIFICATION OR ASSISTANCE TO COMPLETE EVALUATION: Min-Moderate modification of tasks or assist with assess necessary to complete an evaluation.  OT OCCUPATIONAL PROFILE AND HISTORY: Detailed assessment: Review of records and additional review of physical, cognitive, psychosocial history related to current functional performance.  CLINICAL DECISION MAKING: High - multiple treatment options, significant modification of task necessary  REHAB POTENTIAL: Good  EVALUATION COMPLEXITY: High    PLAN:  OT FREQUENCY: 4x/week  OT DURATION: 4 weeks  PLANNED INTERVENTIONS: self care/ADL training, therapeutic exercise, therapeutic activity, neuromuscular re-education, manual therapy, passive range of motion, gait training, balance  training, stair training, and functional mobility training  RECOMMENDED OTHER SERVICES: LSVT LOUD with SLP  CONSULTED AND AGREED WITH PLAN OF CARE: Patient and family member/caregiver  PLAN FOR NEXT SESSION: Will continue with maximal daily exercises, posture exercises, add functional component tasks.  Victoria Hanson, OTR/L, CLT  Victoria Hanson, OT 06/12/2022, 3:51 PM

## 2022-06-13 ENCOUNTER — Ambulatory Visit: Payer: 59 | Admitting: Occupational Therapy

## 2022-06-14 ENCOUNTER — Encounter: Payer: Self-pay | Admitting: Neurology

## 2022-06-14 ENCOUNTER — Ambulatory Visit: Payer: 59 | Admitting: Occupational Therapy

## 2022-06-14 DIAGNOSIS — R2681 Unsteadiness on feet: Secondary | ICD-10-CM | POA: Diagnosis not present

## 2022-06-14 DIAGNOSIS — M6281 Muscle weakness (generalized): Secondary | ICD-10-CM | POA: Diagnosis not present

## 2022-06-14 DIAGNOSIS — R293 Abnormal posture: Secondary | ICD-10-CM | POA: Diagnosis not present

## 2022-06-14 DIAGNOSIS — R262 Difficulty in walking, not elsewhere classified: Secondary | ICD-10-CM

## 2022-06-14 DIAGNOSIS — R278 Other lack of coordination: Secondary | ICD-10-CM

## 2022-06-15 ENCOUNTER — Ambulatory Visit: Payer: 59 | Attending: Neurology | Admitting: Occupational Therapy

## 2022-06-15 DIAGNOSIS — R2681 Unsteadiness on feet: Secondary | ICD-10-CM | POA: Diagnosis not present

## 2022-06-15 DIAGNOSIS — R262 Difficulty in walking, not elsewhere classified: Secondary | ICD-10-CM | POA: Insufficient documentation

## 2022-06-15 DIAGNOSIS — R293 Abnormal posture: Secondary | ICD-10-CM | POA: Diagnosis not present

## 2022-06-15 DIAGNOSIS — R278 Other lack of coordination: Secondary | ICD-10-CM

## 2022-06-15 DIAGNOSIS — M6281 Muscle weakness (generalized): Secondary | ICD-10-CM

## 2022-06-18 ENCOUNTER — Ambulatory Visit: Payer: 59 | Admitting: Occupational Therapy

## 2022-06-20 ENCOUNTER — Ambulatory Visit: Payer: 59 | Admitting: Occupational Therapy

## 2022-06-21 ENCOUNTER — Encounter: Payer: Self-pay | Admitting: Occupational Therapy

## 2022-06-21 ENCOUNTER — Ambulatory Visit: Payer: 59 | Admitting: Occupational Therapy

## 2022-06-21 NOTE — Therapy (Addendum)
OUTPATIENT OCCUPATIONAL THERAPY [facility]RE[facility] Patient Name: Victoria Hanson MRN: [id number] DOB:July 30, 1950, 72 y.o., female Today's Date: 06/15/2022  PCP: Dale [place] REFERRING PROVIDER: Kerin Salen   END OF SESSION:  OT End of Session - 06/21/22 1756     Visit Number 11    Number of Visits 17    Date for OT Re-Evaluation 06/29/22    OT Start Time 1058    OT Stop Time 1159    OT Time Calculation (min) 61 min    Activity Tolerance Patient tolerated treatment well    Behavior During Therapy [facility] for tasks assessed/performed             Past Medical History:  Diagnosis Date   Anxiety    Asthma    Atypical chest pain    a. 2003 Cath: reportedly nl per pt [name]);  b. 04/2015 [facility] Admission, neg troponin.   Colon cancer (HCC) 1999   a. 1999: Pt had partial colectomy. Did develop lung metastasis. Had right upper lobectomy in 01/2007 then had associated chemotherapy.   COPD (chronic obstructive pulmonary disease) (HCC)    DDD (degenerative disc disease), lumbosacral    Depression    Echocardiogram abnormal    a. 02/2010 Echo: EF 55-60%, mild LAE, no regional wall motion abnormalities   Elevated transaminase level    a. ? NASH;  b. 06/2015 - nl LFTs.   GERD (gastroesophageal reflux disease)    Hematuria    Holter monitor, abnormal    a. 12/2009 Holter monitoring: NSR, rare PACs and PVCs.   Hypercholesterolemia    Hyperlipidemia    Hypertension    Leaky heart valve    a. Per pt report - no evidence of valvular abnormalities on prior echoes.   Lesion of skin of Right breast 10/20/2017   Lung metastases 2008   a. S/P resection (lobectomy - right)   Morbid obesity (HCC)    Nephrolithiasis    pt denies   Parkinson disease 08/2016   [name]   Past Surgical History:  Procedure Laterality Date   BREAST EXCISIONAL BIOPSY Right 10/07/2009   benign   CARDIAC CATHETERIZATION  2003   negative as per pt report   CHOLECYSTECTOMY  2000   COLON SURGERY      Colon cancer, removed part of large colon   COLONOSCOPY N/A 04/25/2015   Procedure: COLONOSCOPY;  Surgeon: Scot Jun, MD;  Location: [facility] ENDOSCOPY;  Service: Endoscopy;  Laterality: N/A;   COLONOSCOPY WITH PROPOFOL N/A 06/14/2017   Procedure: COLONOSCOPY WITH PROPOFOL;  Surgeon: Scot Jun, MD;  Location: [facility] ENDOSCOPY;  Service: Endoscopy;  Laterality: N/A;   COLONOSCOPY WITH PROPOFOL N/A 11/17/2019   Procedure: COLONOSCOPY WITH PROPOFOL;  Surgeon: Wyline Mood, MD;  Location: [facility] ENDOSCOPY;  Service: Gastroenterology;  Laterality: N/A;   COLONOSCOPY WITH PROPOFOL N/A 03/21/2021   Procedure: COLONOSCOPY WITH PROPOFOL;  Surgeon: Regis Bill, MD;  Location: [facility] ENDOSCOPY;  Service: Endoscopy;  Laterality: N/A;   ESOPHAGOGASTRODUODENOSCOPY (EGD) WITH PROPOFOL N/A 03/21/2021   Procedure: ESOPHAGOGASTRODUODENOSCOPY (EGD) WITH PROPOFOL;  Surgeon: Regis Bill, MD;  Location: [facility] ENDOSCOPY;  Service: Endoscopy;  Laterality: N/A;   LOBECTOMY  01/2007   Right lung, upper lobe right side removed   TOTAL ABDOMINAL HYSTERECTOMY     abnormal bleeding   TUBAL LIGATION  1980   Patient Active Problem List   Diagnosis Date Noted   Hypophonia 05/23/2022   Left knee pain 05/02/2022   Parkinson disease 03/12/2022   Change in bowel  movement 02/05/2022   Lower extremity edema 01/30/2022   Cloudy urine 11/29/2021   History of total knee arthroplasty 09/13/2021   Hepatic steatosis 09/13/2021   Urinary frequency 03/26/2021   Right knee pain 01/01/2021   UTI (urinary tract infection) 11/27/2020   Vitamin D deficiency 11/05/2020   Aortic atherosclerosis (HCC) 11/05/2020   Dizziness 08/24/2020   Cardiomegaly 08/24/2020   Constipation 11/11/2019   Anemia 11/11/2019   Near syncope 02/13/2019   Fibromuscular dysplasia (HCC) 02/02/2019   Abdominal bloating 01/04/2019   Unsteady gait 12/03/2018   History of UTI 10/08/2018   Ear fullness, right 10/08/2018   B12 deficiency  03/11/2018   Goals of care, counseling/discussion 10/15/2017   Orthostatic hypotension due to Parkinson's disease (HCC) 07/31/2017   COPD exacerbation (HCC) 07/31/2017   REM behavioral disorder 05/28/2017   Elevated liver function tests 08/25/2016   Carotid artery disease (HCC) 07/19/2016   Tremor 06/01/2016   Osteoporosis 04/08/2016   Foot pain, bilateral 07/24/2015   Head pain 07/24/2015   Hyperlipidemia 06/21/2015   Other fatigue 06/21/2015   BMI 33.0-33.9,adult 06/21/2015   Disorder of kidney and ureter 03/28/2015   Hyperglycemia 03/20/2015   Swelling of lower extremity 11/20/2014   Dysuria 10/24/2014   Health care maintenance 07/17/2014   Hypokalemia 04/05/2013   TIA (transient ischemic attack) 02/09/2013   Transient cerebral ischemia 02/09/2013   Anxiety, mild 03/13/2012   Depression, recurrent (HCC) 03/13/2012   History of colon cancer 03/13/2012   Hypertension 03/13/2012   CKD (chronic kidney disease), stage III (HCC) 03/13/2012   GERD (gastroesophageal reflux disease) 03/13/2012   Acid reflux 03/13/2012   History of malignant neoplasm metastatic to lung 03/13/2012   Major depressive disorder with single episode 03/13/2012   Hypercholesterolemia 02/02/2010   SOB (shortness of breath) on exertion 02/02/2010   Pure hypercholesterolemia 02/02/2010    ONSET DATE: 05/23/2022, diagnosed with Parkinson's in 2018  REFERRING DIAG: Parkinson's disease, LSVT BIG  THERAPY DIAG:  Muscle weakness (generalized)  Other lack of coordination  Unsteadiness on feet  Abnormal posture  Difficulty in walking, not elsewhere classified  Rationale for Evaluation and Treatment: Rehabilitation  SUBJECTIVE:   SUBJECTIVE STATEMENT: Pt reports her energy is low today and feeling tired.  Tried to do some of the exercises last night and has also incorporated hand exercises at home with the ones she remembers.    PERTINENT HISTORY: Pt diagnosed with Parkinson's disease in 2018.  Pt  with multiple visits to the ER this year with BP issues, UTI, weakness and changes in medication.  Pt requiring increased assistance from family with all self care and mobility.  She had a knee replacement on the left March 2023.  Tremors in bilateral hands.Pt has a history of hypotension.  PRECAUTIONS: Fall, Pt has a history of Neurogenic Orthostatic Hypotension  WEIGHT BEARING RESTRICTIONS: No  PAIN:  Are you having pain? No  FALLS: Has patient fallen in last 6 months? No  LIVING ENVIRONMENT: Lives with: lives with their family Lives in: House/apartment Stairs: No Has following equipment at home: Environmental consultant - 2 wheeled, shower chair, bed side commode, and Grab bars  PLOF: Needs assistance with ADLs, Needs assistance with homemaking, and Needs assistance with transfers  PATIENT GOALS: Pt reports she would like to be as independent as possible, do more for herself  OBJECTIVE:   HAND DOMINANCE: Right  ADLs: Overall ADLs: Pt reports her daughter helps her with bathing and dressing Transfers/ambulation related to ADLs: Eating: Able to feed self independently Grooming:  Able to complete grooming with modified independence, difficulty with standing for long periods of time UB Dressing: min to mod assist LB Dressing: mod to max assist Toileting: min to mod assist  Bathing: max assist Tub Shower transfers: mod assist, has shower chair and grab bar in shower. Equipment: Shower seat with back, Grab bars, and bed side commode  IADLs: Shopping: Dependent Light housekeeping: Reports she tries to help wash dishes but cannot stand very long Meal Prep: Max to dependent  Community mobility: does not drive, family provides transportation, ambulates with rolling walker  Medication management: TBA Financial management: TBA Handwriting: 25% legible, demonstrates severe micrographia  MOBILITY STATUS: Needs Assist: assist with sit to stand transfer, ambulates with RW, difficulty with turns, and  start hesitation, freezing of gait noted.  POSTURE COMMENTS:  rounded shoulders and forward head  ACTIVITY TOLERANCE: Activity tolerance: Fair, patient requires frequent rest breaks during evaluation and during mobility tasks  FUNCTIONAL OUTCOME MEASURES: FOTO 6 min walk test 350 feet, 10th visit: 900 feet 5 times sit to stand unable to perform sit to stand without use of arms, 10th visit: 15 secs BERG balance: 21/56, 10th visit: 29/56  COORDINATION: Finger Nose Finger test: impaired  9 Hole Peg test: Right: 50 sec; Left: 56 sec  UE/LE ROM:   Pt with limited ROM of bilateral UEs, LE:  right knee extension full, left 30 degrees of knee extension and limited dorsiflexion of knee  UE MMT:    3/5 overall strength in UE, LE:  right 3/5, left 2-/5  MUSCLE TONE: Increased muscle tone L greater than R, UE greater than LE.    SENSATION: WFL  COGNITION: Overall cognitive status: Within functional limits for tasks assessed  OBSERVATIONS: Bradykinesia   TODAY'S TREATMENT:                                                                                                                              DATE: 06/15/2022  Neuromuscular Reeducation:  Patient seen for initial instruction of LSVT BIG exercises: LSVT Daily Session Maximal Daily Exercises: Sustained movements are designed to rescale the amplitude of movement output for generalization to daily functional activities. Performed as follows for 1 set of 10 repetitions each: Multi directional sustained movements- 1) Floor to ceiling, 2) Side to side. Multi directional Repetitive movements performed in standing and are designed to provide retraining effort needed for sustained muscle activation in tasks Performed as follows: 3) Step and reach forward, 4) Step and Reach Backwards, 5) Step and reach sideways, 6) Rock and reach forward/backward, 7) Rock and reach sideways. Sit to stand from mat table on lowest setting with cues for weight shift,  technique and min to mod assist for 10 reps for 1 set.    All exercises were performed in an ADAPTED version of the LSVT BIG protocol for maximal daily exercises.  The adapted version utilizes a chair to assist with balance.  Pt requiring min assist to min  guard for exercises in standing for transitional movements and maintaining balance.  Pt requires verbal cues and tactile cues for proper form and technique.  Posture exercises in sitting and standing at the wall, cues for shoulder retraction and promotion of upright posture, completed 5 reps each.   Sit to stand from medium therapy ball this date with min assist to min guard for 5 reps, cues for weight shifting forwards.    Functional mobility: Pt seen for functional mobility with use of rolling walker, 2 trials of 325 feet, pt reports fatigue and required increased rest breaks this date.  Continued cues for step height and length for greater amplitude of movement.  Turns remain wide and working on pivot turns with assist.   Car transfer with min assist to min guard and cues managing lower extremities to get into vehicle.  Stepping up and down on curb with min guard and cues for stepping patterns and walker placement.   7  hand exercises to help promote handwriting, dexterity and flexibility. 1)  Fisting with arm then arm extension with fingers extended with BIG hand movements 2) oppositional movements of the thumb to each digit 3) wrist flex/ext with elbows extended 4) supination/pronation with elbows bent 5) tendon gliding with hook fist and then finger extension 6) tendon gliding with tabletop movement of fingers with MP flexion, PIP and DIP extension 7) MP flexion, PIP flexion, DIP extension Performed with therapist demo and min cues for form  Functional component tasks: Manipulation of buttons for 5 reps each with increased time to complete task, completed regular buttons and metal ones, hand flicks prior to task. Stepping up and  down from curb min guard and cues Sit to stand with min assist to min guard without the use of arms Handwriting with name on lined paper with cues for size of letter formation Car transfer with min assist to min guard with cues Scooting to edge of the chair to prepare for sit to stand     PATIENT EDUCATION: Education details: LSVT BIG program, POC, goals, role of OT Person educated: Patient Education method: Explanation Education comprehension: verbalized understanding  HOME EXERCISE PROGRAM: LSVT BIG maximal daily exercises, functional component tasks  GOALS: Goals reviewed with patient? Yes  SHORT TERM GOALS: Target date: 06/08/2022  Pt will demonstrate sit to stand from chair with modified independence, using arm rest as needed to push up from chair.  Baseline: min to mod assist, 10th visit able to complete Goal status: MET   LONG TERM GOALS: Target date: 06/29/2022  Patient will improve gait speed and endurance to be able to ambulate 1000 feet in 6 minutes to negotiate around the home and community safely in 4 weeks.  Baseline: Eval 350 feet , 10th visit: 900 feet, goal revised Goal status: REVISED IN PROGRESS  2.  Patient will complete HEP for maximal daily exercises with modified independence in 4 weeks.  Baseline: Eval-no current program, 10th visit:  min assist, goal revised Goal status: REVISED IN PROGRESS  3.  Patient will transfer from to sit to stand safely and independently from a variety of chairs/surfaces in 4 weeks.  Baseline: unable at eval, 10th visit: working towards lower surface heights Goal status: IN PROGRESS  4.  Pt will complete dressing with min assist  Baseline: mod to max assist at eval , 10th visit: min to mod assist Goal status: IN PROGRESS  5.  Pt will complete bathing with min assist  Baseline: max assist, 10th visit:  mod assist Goal status: IN PROGRESS  6.  Pt will complete standing at the sink for up to 10 mins to wash dishes with  SBA. Baseline: Difficulty standing to wash dishes, can stand 1-3 mins, 10th visit:  able to stand for up to 5 mins, revise goal to 10 mins. Goal status: REVISED IN PROGRESS  ASSESSMENT:  CLINICAL IMPRESSION: Pt has continued to make good progress, she did not feel well the last couple days with reports of fatigue with ambulation and difficulty wearing mask with congestion symptoms.  She continues to improve with maximal daily exercises and requires decreased cues from therapist and is starting to demonstrate increased self awareness when she starts to perform an exercise incorrectly. Continue to work on calibration of movements along with amplitude of movement patterns and add additional complexity to tasks as tolerated.    PERFORMANCE DEFICITS: in functional skills including ADLs, IADLs, coordination, dexterity, ROM, strength, flexibility, Fine motor control, Gross motor control, mobility, balance, endurance, decreased knowledge of use of DME, and UE functional use, cognitive skills including attention and memory, and psychosocial skills including environmental adaptation, habits, and routines and behaviors.   IMPAIRMENTS: are limiting patient from ADLs, IADLs, leisure, and social participation.   COMORBIDITIES:  has co-morbidities such as orthostatic hypotension, CAD, syncope, knee pain, unsteady gait, fall risk, COPD, osteoporosis,   that affects occupational performance. Patient will benefit from skilled OT to address above impairments and improve overall function.  MODIFICATION OR ASSISTANCE TO COMPLETE EVALUATION: Min-Moderate modification of tasks or assist with assess necessary to complete an evaluation.  OT OCCUPATIONAL PROFILE AND HISTORY: Detailed assessment: Review of records and additional review of physical, cognitive, psychosocial history related to current functional performance.  CLINICAL DECISION MAKING: High - multiple treatment options, significant modification of task  necessary  REHAB POTENTIAL: Good  EVALUATION COMPLEXITY: High    PLAN:  OT FREQUENCY: 4x/week  OT DURATION: 4 weeks  PLANNED INTERVENTIONS: self care/ADL training, therapeutic exercise, therapeutic activity, neuromuscular re-education, manual therapy, passive range of motion, gait training, balance training, stair training, and functional mobility training  RECOMMENDED OTHER SERVICES: LSVT LOUD with SLP  CONSULTED AND AGREED WITH PLAN OF CARE: Patient and family member/caregiver  PLAN FOR NEXT SESSION: Will continue with maximal daily exercises, posture exercises, add functional component tasks.  Ebony Yorio Cornelius Moras, OTR/L, CLT  Elizardo Chilson, OT 06/21/2022, 6:06 PM

## 2022-06-21 NOTE — Therapy (Addendum)
OUTPATIENT OCCUPATIONAL THERAPY PARKINSON'S TREATMENT/PROGRESS UPDATE REPORTING PERIOD FROM 05/25/2022 TO 06/14/2022  Patient Name: Victoria Hanson MRN: [id number] DOB:02/27/1951, 72 y.o., female Today's Date: 06/14/2022  PCP: Einar Pheasant REFERRING PROVIDER: Alonza Bogus   END OF SESSION:  OT End of Session - 06/21/22 1641     Visit Number 10    Number of Visits 17    Date for OT Re-Evaluation 06/29/22    OT Start Time 1259    OT Stop Time 1401    OT Time Calculation (min) 62 min    Activity Tolerance Patient tolerated treatment well    Behavior During Therapy [facility] for tasks assessed/performed             Past Medical History:  Diagnosis Date   Anxiety    Asthma    Atypical chest pain    a. 2003 Cath: reportedly nl per pt [name]);  b. 04/2015 [place] Admission, neg troponin.   Colon cancer ([place]) 1999   a. 1999: Pt had partial colectomy. Did develop lung metastasis. Had right upper lobectomy in 01/2007 then had associated chemotherapy.   COPD (chronic obstructive pulmonary disease) (HCC)    DDD (degenerative disc disease), lumbosacral    Depression    Echocardiogram abnormal    a. 02/2010 Echo: EF 55-60%, mild LAE, no regional wall motion abnormalities   Elevated transaminase level    a. ? NASH;  b. 06/2015 - nl LFTs.   GERD (gastroesophageal reflux disease)    Hematuria    Holter monitor, abnormal    a. 12/2009 Holter monitoring: NSR, rare PACs and PVCs.   Hypercholesterolemia    Hyperlipidemia    Hypertension    Leaky heart valve    a. Per pt report - no evidence of valvular abnormalities on prior echoes.   Lesion of skin of Right breast 10/20/2017   Lung metastases 2008   a. S/P resection (lobectomy - right)   Morbid obesity ([place])    Nephrolithiasis    pt denies   Parkinson disease 08/2016   [name]   Past Surgical History:  Procedure Laterality Date   BREAST EXCISIONAL BIOPSY Right 10/07/2009   benign   CARDIAC CATHETERIZATION  2003   negative as  per pt report   CHOLECYSTECTOMY  2000   COLON SURGERY     Colon cancer, removed part of large colon   COLONOSCOPY N/A 04/25/2015   Procedure: COLONOSCOPY;  Surgeon: Manya Silvas, MD;  Location: [place];  Service: Endoscopy;  Laterality: N/A;   COLONOSCOPY WITH PROPOFOL N/A 06/14/2017   Procedure: COLONOSCOPY WITH PROPOFOL;  Surgeon: Manya Silvas, MD;  Location: [facility] ENDOSCOPY;  Service: Endoscopy;  Laterality: N/A;   COLONOSCOPY WITH PROPOFOL N/A 11/17/2019   Procedure: COLONOSCOPY WITH PROPOFOL;  Surgeon: [name], MD;  Location: [facility] ENDOSCOPY;  Service: Gastroenterology;  Laterality: N/A;   COLONOSCOPY WITH PROPOFOL N/A 03/21/2021   Procedure: COLONOSCOPY WITH PROPOFOL;  Surgeon: Lesly Rubenstein, MD;  Location: [facility] ENDOSCOPY;  Service: Endoscopy;  Laterality: N/A;   ESOPHAGOGASTRODUODENOSCOPY (EGD) WITH PROPOFOL N/A 03/21/2021   Procedure: ESOPHAGOGASTRODUODENOSCOPY (EGD) WITH PROPOFOL;  Surgeon: Lesly Rubenstein, MD;  Location: [facility] ENDOSCOPY;  Service: Endoscopy;  Laterality: N/A;   LOBECTOMY  01/2007   Right lung, upper lobe right side removed   TOTAL ABDOMINAL HYSTERECTOMY     abnormal bleeding   TUBAL LIGATION  1980   Patient Active Problem List   Diagnosis Date Noted   Hypophonia 05/23/2022   Left knee pain 05/02/2022  Parkinson disease 03/12/2022   Change in bowel movement 02/05/2022   Lower extremity edema 01/30/2022   Cloudy urine 11/29/2021   History of total knee arthroplasty 09/13/2021   Hepatic steatosis 09/13/2021   Urinary frequency 03/26/2021   Right knee pain 01/01/2021   UTI (urinary tract infection) 11/27/2020   Vitamin D deficiency 11/05/2020   Aortic atherosclerosis ([place]) 11/05/2020   Dizziness 08/24/2020   Cardiomegaly 08/24/2020   Constipation 11/11/2019   Anemia 11/11/2019   Near syncope 02/13/2019   Fibromuscular dysplasia ([place]) 02/02/2019   Abdominal bloating 01/04/2019   Unsteady gait 12/03/2018   History of UTI  10/08/2018   Ear fullness, right 10/08/2018   B12 deficiency 03/11/2018   Goals of care, counseling/discussion 10/15/2017   Orthostatic hypotension due to Parkinson's disease ([place]) 07/31/2017   COPD exacerbation ([place]) 07/31/2017   REM behavioral disorder 05/28/2017   Elevated liver function tests 08/25/2016   Carotid artery disease ([place]) 07/19/2016   Tremor 06/01/2016   Osteoporosis 04/08/2016   Foot pain, bilateral 07/24/2015   Head pain 07/24/2015   Hyperlipidemia 06/21/2015   Other fatigue 06/21/2015   BMI 33.0-33.9,adult 06/21/2015   Disorder of kidney and ureter 03/28/2015   Hyperglycemia 03/20/2015   Swelling of lower extremity 11/20/2014   Dysuria 10/24/2014   Health care maintenance 07/17/2014   Hypokalemia 04/05/2013   TIA (transient ischemic attack) 02/09/2013   Transient cerebral ischemia 02/09/2013   Anxiety, mild 03/13/2012   Depression, recurrent ([place]) 03/13/2012   History of colon cancer 03/13/2012   Hypertension 03/13/2012   CKD (chronic kidney disease), stage III ([place]) 03/13/2012   GERD (gastroesophageal reflux disease) 03/13/2012   Acid reflux 03/13/2012   History of malignant neoplasm metastatic to lung 03/13/2012   Major depressive disorder with single episode 03/13/2012   Hypercholesterolemia 02/02/2010   SOB (shortness of breath) on exertion 02/02/2010   Pure hypercholesterolemia 02/02/2010    ONSET DATE: 05/23/2022, diagnosed with Parkinson's in 2018  REFERRING DIAG: Parkinson's disease, LSVT BIG  THERAPY DIAG:  Muscle weakness (generalized)  Other lack of coordination  Unsteadiness on feet  Difficulty in walking, not elsewhere classified  Abnormal posture  Rationale for Evaluation and Treatment: Rehabilitation  SUBJECTIVE:   SUBJECTIVE STATEMENT: Pt reports she didn't feel well yesterday, everyone in her house has been sick off and on for the last few weeks.  She reports some congestion and wearing a mask during treatment session.  Reports it is difficult to breathe during exercises while her mask is on. Rest breaks as needed.    PERTINENT HISTORY: Pt diagnosed with Parkinson's disease in 2018.  Pt with multiple visits to the ER this year with BP issues, UTI, weakness and changes in medication.  Pt requiring increased assistance from family with all self care and mobility.  She had a knee replacement on the left March 2023.  Tremors in bilateral hands.Pt has a history of hypotension.  PRECAUTIONS: Fall, Pt has a history of Neurogenic Orthostatic Hypotension  WEIGHT BEARING RESTRICTIONS: No  PAIN:  Are you having pain? No  FALLS: Has patient fallen in last 6 months? No  LIVING ENVIRONMENT: Lives with: lives with their family Lives in: House/apartment Stairs: No Has following equipment at home: Gilford Rile - 2 wheeled, shower chair, bed side commode, and Grab bars  PLOF: Needs assistance with ADLs, Needs assistance with homemaking, and Needs assistance with transfers  PATIENT GOALS: Pt reports she would like to be as independent as possible, do more for herself  OBJECTIVE:   HAND DOMINANCE:  Right  ADLs: Overall ADLs: Pt reports her daughter helps her with bathing and dressing Transfers/ambulation related to ADLs: Eating: Able to feed self independently Grooming: Able to complete grooming with modified independence, difficulty with standing for long periods of time UB Dressing: min to mod assist LB Dressing: mod to max assist Toileting: min to mod assist  Bathing: max assist Tub Shower transfers: mod assist, has shower chair and grab bar in shower. Equipment: Shower seat with back, Grab bars, and bed side commode  IADLs: Shopping: Dependent Light housekeeping: Reports she tries to help wash dishes but cannot stand very long Meal Prep: Max to dependent  Community mobility: does not drive, family provides transportation, ambulates with rolling walker  Medication management: TBA Financial management:  TBA Handwriting: 25% legible, demonstrates severe micrographia  MOBILITY STATUS: Needs Assist: assist with sit to stand transfer, ambulates with RW, difficulty with turns, and start hesitation, freezing of gait noted.  POSTURE COMMENTS:  rounded shoulders and forward head  ACTIVITY TOLERANCE: Activity tolerance: Fair, patient requires frequent rest breaks during evaluation and during mobility tasks  FUNCTIONAL OUTCOME MEASURES: FOTO 6 min walk test 350 feet, 10th visit: 900 feet 5 times sit to stand unable to perform sit to stand without use of arms, 10th visit: 15 secs BERG balance: 21/56, 10th visit: 29/56  COORDINATION: Finger Nose Finger test: impaired  9 Hole Peg test: Right: 50 sec; Left: 56 sec  UE/LE ROM:   Pt with limited ROM of bilateral UEs, LE:  right knee extension full, left 30 degrees of knee extension and limited dorsiflexion of knee  UE MMT:    3/5 overall strength in UE, LE:  right 3/5, left 2-/5  MUSCLE TONE: Increased muscle tone L greater than R, UE greater than LE.    SENSATION: WFL  COGNITION: Overall cognitive status: Within functional limits for tasks assessed  OBSERVATIONS: Bradykinesia   TODAY'S TREATMENT:                                                                                                                              DATE: 06/14/2022  Neuromuscular Reeducation:  Patient seen for initial instruction of LSVT BIG exercises: LSVT Daily Session Maximal Daily Exercises: Sustained movements are designed to rescale the amplitude of movement output for generalization to daily functional activities. Performed as follows for 1 set of 10 repetitions each: Multi directional sustained movements- 1) Floor to ceiling, 2) Side to side. Multi directional Repetitive movements performed in standing and are designed to provide retraining effort needed for sustained muscle activation in tasks Performed as follows: 3) Step and reach forward, 4) Step and Reach  Backwards, 5) Step and reach sideways, 6) Rock and reach forward/backward, 7) Rock and reach sideways. Sit to stand from mat table on lowest setting with cues for weight shift, technique and min to mod assist for 10 reps for 1 set.    All exercises were performed in an ADAPTED version of  the LSVT BIG protocol for maximal daily exercises.  The adapted version utilizes a chair to assist with balance.  Pt requiring min assist to min guard for exercises in standing for transitional movements and maintaining balance.  Pt requires therapist demo, verbal cues and tactile cues for proper form and technique but is becoming more familiar with exercises each session.   Posture exercises in sitting and standing at the wall, cues for shoulder retraction and promotion of upright posture, completed 5 reps each.   Sit to stand from medium therapy ball this date with min assist to min guard for 5 reps, cues for weight shifting forwards.    Functional mobility: 6 min walk test 900 feet  Car transfer with min assist to min guard and cues managing lower extremities to get into vehicle.  Stepping up and down on curb with min guard and cues for stepping patterns and walker placement.   7  hand exercises to help promote handwriting, dexterity and flexibility. 1)  Fisting with arm then arm extension with fingers extended with BIG hand movements 2) oppositional movements of the thumb to each digit 3) wrist flex/ext with elbows extended 4) supination/pronation with elbows bent 5) tendon gliding with hook fist and then finger extension 6) tendon gliding with tabletop movement of fingers with MP flexion, PIP and DIP extension 7) MP flexion, PIP flexion, DIP extension Performed with therapist demo and min cues for form  Functional component tasks: Manipulation of buttons for 5 reps each with increased time to complete task, completed regular buttons and metal ones, hand flicks prior to task. Stepping up and down from  curb min guard and cues Sit to stand with min assist to min guard Handwriting with name on lined paper, increased letter size in last few sessions. Car transfer with min assist to min guard with cues Scooting to edge of the chair with occasional cues     PATIENT EDUCATION: Education details: LSVT BIG program, POC, goals, role of OT Person educated: Patient Education method: Explanation Education comprehension: verbalized understanding  HOME EXERCISE PROGRAM: LSVT BIG maximal daily exercises, functional component tasks  GOALS: Goals reviewed with patient? Yes  SHORT TERM GOALS: Target date: 06/08/2022  Pt will demonstrate sit to stand from chair with modified independence, using arm rest as needed to push up from chair.  Baseline: min to mod assist, 10th visit able to complete Goal status: MET   LONG TERM GOALS: Target date: 06/29/2022  Patient will improve gait speed and endurance to be able to ambulate 1000 feet in 6 minutes to negotiate around the home and community safely in 4 weeks.  Baseline: Eval 350 feet , 10th visit: 900 feet, goal revised Goal status: REVISED IN PROGRESS  2.  Patient will complete HEP for maximal daily exercises with modified independence in 4 weeks.  Baseline: Eval-no current program, 10th visit:  min assist, goal revised Goal status: REVISED IN PROGRESS  3.  Patient will transfer from to sit to stand safely and independently from a variety of chairs/surfaces in 4 weeks.  Baseline: unable at eval, 10th visit: working towards lower surface heights Goal status: IN PROGRESS  4.  Pt will complete dressing with min assist  Baseline: mod to max assist at eval , 10th visit: min to mod assist Goal status: IN PROGRESS  5.  Pt will complete bathing with min assist  Baseline: max assist, 10th visit:  mod assist Goal status: IN PROGRESS  6.  Pt will complete standing at  the sink for up to 10 mins to wash dishes with SBA. Baseline: Difficulty standing  to wash dishes, can stand 1-3 mins, 10th visit:  able to stand for up to 5 mins, revise goal to 10 mins. Goal status: REVISED IN PROGRESS  ASSESSMENT:  CLINICAL IMPRESSION: Pt has made excellent progress over the last 10 sessions, her 6 min walk test improved from 350 feet to 900 feet.  At evaluation, pt was not able to perform sit to stand without assistance and use of hands.  She has progressed to being able to perform sit to stand without the use of hands for most repetitions and up to 10 reps.  She does demonstrate decreased initiation of task at times but with repetition and intensity, her performance continues to improve.    Her amplitude of steps continues to improve with less shuffling of gait.  Pt has demonstrated increased understanding and performance with maximal daily exercises but is not yet consistent with performing at home.  Will continue to work towards goals in plan of care to maximize safety and independence in necessary daily tasks.     PERFORMANCE DEFICITS: in functional skills including ADLs, IADLs, coordination, dexterity, ROM, strength, flexibility, Fine motor control, Gross motor control, mobility, balance, endurance, decreased knowledge of use of DME, and UE functional use, cognitive skills including attention and memory, and psychosocial skills including environmental adaptation, habits, and routines and behaviors.   IMPAIRMENTS: are limiting patient from ADLs, IADLs, leisure, and social participation.   COMORBIDITIES:  has co-morbidities such as orthostatic hypotension, CAD, syncope, knee pain, unsteady gait, fall risk, COPD, osteoporosis,   that affects occupational performance. Patient will benefit from skilled OT to address above impairments and improve overall function.  MODIFICATION OR ASSISTANCE TO COMPLETE EVALUATION: Min-Moderate modification of tasks or assist with assess necessary to complete an evaluation.  OT OCCUPATIONAL PROFILE AND HISTORY: Detailed  assessment: Review of records and additional review of physical, cognitive, psychosocial history related to current functional performance.  CLINICAL DECISION MAKING: High - multiple treatment options, significant modification of task necessary  REHAB POTENTIAL: Good  EVALUATION COMPLEXITY: High    PLAN:  OT FREQUENCY: 4x/week  OT DURATION: 4 weeks  PLANNED INTERVENTIONS: self care/ADL training, therapeutic exercise, therapeutic activity, neuromuscular re-education, manual therapy, passive range of motion, gait training, balance training, stair training, and functional mobility training  RECOMMENDED OTHER SERVICES: LSVT LOUD with SLP  CONSULTED AND AGREED WITH PLAN OF CARE: Patient and family member/caregiver  PLAN FOR NEXT SESSION: Will continue with maximal daily exercises, posture exercises, add functional component tasks.  Victoria Hanson Oneita Jolly, OTR/L, CLT  Kaleb Linquist, OT 06/21/2022, 4:47 PM

## 2022-06-22 ENCOUNTER — Ambulatory Visit: Payer: 59 | Admitting: Occupational Therapy

## 2022-06-25 ENCOUNTER — Encounter: Payer: Medicare Other | Admitting: Occupational Therapy

## 2022-06-26 ENCOUNTER — Encounter: Payer: Medicare Other | Admitting: Occupational Therapy

## 2022-06-27 ENCOUNTER — Telehealth: Payer: Self-pay

## 2022-06-27 ENCOUNTER — Encounter: Payer: Medicare Other | Admitting: Occupational Therapy

## 2022-06-27 NOTE — Telephone Encounter (Signed)
Patient called in wanting to advise [name] that she can not take 3 pills a day of Carbidopa-Levodopa ER (SINEMET CR) 25-100 MG tablet controlled release as it affects her breathing but said that she has been doing the 2 a day and feels like it is helping.

## 2022-06-28 ENCOUNTER — Encounter: Payer: Medicare Other | Admitting: Occupational Therapy

## 2022-07-02 ENCOUNTER — Ambulatory Visit: Payer: 59 | Admitting: Occupational Therapy

## 2022-07-03 ENCOUNTER — Encounter: Payer: 59 | Admitting: Occupational Therapy

## 2022-07-03 ENCOUNTER — Ambulatory Visit: Payer: 59 | Admitting: Occupational Therapy

## 2022-07-04 ENCOUNTER — Ambulatory Visit: Payer: 59 | Admitting: Occupational Therapy

## 2022-07-04 DIAGNOSIS — M6281 Muscle weakness (generalized): Secondary | ICD-10-CM

## 2022-07-04 DIAGNOSIS — R278 Other lack of coordination: Secondary | ICD-10-CM | POA: Diagnosis not present

## 2022-07-04 DIAGNOSIS — R262 Difficulty in walking, not elsewhere classified: Secondary | ICD-10-CM

## 2022-07-04 DIAGNOSIS — R293 Abnormal posture: Secondary | ICD-10-CM | POA: Diagnosis not present

## 2022-07-04 DIAGNOSIS — R2681 Unsteadiness on feet: Secondary | ICD-10-CM

## 2022-07-05 ENCOUNTER — Ambulatory Visit (INDEPENDENT_AMBULATORY_CARE_PROVIDER_SITE_OTHER): Payer: 59 | Admitting: Internal Medicine

## 2022-07-05 ENCOUNTER — Encounter: Payer: 59 | Admitting: Occupational Therapy

## 2022-07-05 ENCOUNTER — Encounter: Payer: Self-pay | Admitting: Internal Medicine

## 2022-07-05 VITALS — BP 120/74 | HR 69 | Temp 97.8°F | Resp 16 | Ht 64.0 in | Wt 165.6 lb

## 2022-07-05 DIAGNOSIS — I700 Atherosclerosis of aorta: Secondary | ICD-10-CM | POA: Diagnosis not present

## 2022-07-05 DIAGNOSIS — R7989 Other specified abnormal findings of blood chemistry: Secondary | ICD-10-CM

## 2022-07-05 DIAGNOSIS — I779 Disorder of arteries and arterioles, unspecified: Secondary | ICD-10-CM

## 2022-07-05 DIAGNOSIS — K219 Gastro-esophageal reflux disease without esophagitis: Secondary | ICD-10-CM | POA: Diagnosis not present

## 2022-07-05 DIAGNOSIS — Z85038 Personal history of other malignant neoplasm of large intestine: Secondary | ICD-10-CM | POA: Diagnosis not present

## 2022-07-05 DIAGNOSIS — Z8589 Personal history of malignant neoplasm of other organs and systems: Secondary | ICD-10-CM

## 2022-07-05 DIAGNOSIS — Z96652 Presence of left artificial knee joint: Secondary | ICD-10-CM

## 2022-07-05 DIAGNOSIS — G20A1 Parkinson's disease without dyskinesia, without mention of fluctuations: Secondary | ICD-10-CM

## 2022-07-05 DIAGNOSIS — K760 Fatty (change of) liver, not elsewhere classified: Secondary | ICD-10-CM | POA: Diagnosis not present

## 2022-07-05 DIAGNOSIS — F419 Anxiety disorder, unspecified: Secondary | ICD-10-CM

## 2022-07-05 DIAGNOSIS — N183 Chronic kidney disease, stage 3 unspecified: Secondary | ICD-10-CM

## 2022-07-05 DIAGNOSIS — I10 Essential (primary) hypertension: Secondary | ICD-10-CM | POA: Diagnosis not present

## 2022-07-05 DIAGNOSIS — F339 Major depressive disorder, recurrent, unspecified: Secondary | ICD-10-CM

## 2022-07-05 DIAGNOSIS — G903 Multi-system degeneration of the autonomic nervous system: Secondary | ICD-10-CM

## 2022-07-05 DIAGNOSIS — E7800 Pure hypercholesterolemia, unspecified: Secondary | ICD-10-CM

## 2022-07-05 DIAGNOSIS — D649 Anemia, unspecified: Secondary | ICD-10-CM | POA: Diagnosis not present

## 2022-07-05 DIAGNOSIS — I773 Arterial fibromuscular dysplasia: Secondary | ICD-10-CM | POA: Diagnosis not present

## 2022-07-05 DIAGNOSIS — M25562 Pain in left knee: Secondary | ICD-10-CM

## 2022-07-05 DIAGNOSIS — R739 Hyperglycemia, unspecified: Secondary | ICD-10-CM

## 2022-07-05 NOTE — Progress Notes (Addendum)
Subjective:    Patient ID: Victoria Hanson, female    DOB: 1951-04-15, 72 y.o.   MRN: [id number]  Patient here for  Chief Complaint  Patient presents with   Medical Management of Chronic Issues    HPI Here to follow up regarding her blood pressure, cholesterol and parkinsons.  Now seeing Dr Tat.  She is back on sinemet - taking two times per day.  Reports did not tolerated tid dosing.  Is doing much better.  Moving around better.  Going to therapy.  Previous congestion and some cough.  Taking robitussin and tylenol. Improved.  No significant cough or congestion now.  She has bene using flonase.  No chest pain.  Breathing stable.  No increased sob.  No vomiting.  No abdominal pain reported.  She does report increased problems with her knees.  Request referral to Dr Binnie Rail for evaluation.     Past Medical History:  Diagnosis Date   Anxiety    Asthma    Atypical chest pain    a. 2003 Cath: reportedly nl per pt [name]);  b. 04/2015 [facility] Admission, neg troponin.   Colon cancer ([place]) 1999   a. 1999: Pt had partial colectomy. Did develop lung metastasis. Had right upper lobectomy in 01/2007 then had associated chemotherapy.   COPD (chronic obstructive pulmonary disease) (HCC)    DDD (degenerative disc disease), lumbosacral    Depression    Echocardiogram abnormal    a. 02/2010 Echo: EF 55-60%, mild LAE, no regional wall motion abnormalities   Elevated transaminase level    a. ? NASH;  b. 06/2015 - nl LFTs.   GERD (gastroesophageal reflux disease)    Hematuria    Holter monitor, abnormal    a. 12/2009 Holter monitoring: NSR, rare PACs and PVCs.   Hypercholesterolemia    Hyperlipidemia    Hypertension    Leaky heart valve    a. Per pt report - no evidence of valvular abnormalities on prior echoes.   Lesion of skin of Right breast 10/20/2017   Lung metastases 2008   a. S/P resection (lobectomy - right)   Morbid obesity ([place])    Nephrolithiasis    pt denies   Parkinson disease  08/2016   [name]   Past Surgical History:  Procedure Laterality Date   BREAST EXCISIONAL BIOPSY Right 10/07/2009   benign   CARDIAC CATHETERIZATION  2003   negative as per pt report   CHOLECYSTECTOMY  2000   COLON SURGERY     Colon cancer, removed part of large colon   COLONOSCOPY N/A 04/25/2015   Procedure: COLONOSCOPY;  Surgeon: Manya Silvas, MD;  Location: [facility];  Service: Endoscopy;  Laterality: N/A;   COLONOSCOPY WITH PROPOFOL N/A 06/14/2017   Procedure: COLONOSCOPY WITH PROPOFOL;  Surgeon: Manya Silvas, MD;  Location: [facility] ENDOSCOPY;  Service: Endoscopy;  Laterality: N/A;   COLONOSCOPY WITH PROPOFOL N/A 11/17/2019   Procedure: COLONOSCOPY WITH PROPOFOL;  Surgeon: [name], MD;  Location: [facility] ENDOSCOPY;  Service: Gastroenterology;  Laterality: N/A;   COLONOSCOPY WITH PROPOFOL N/A 03/21/2021   Procedure: COLONOSCOPY WITH PROPOFOL;  Surgeon: Lesly Rubenstein, MD;  Location: [facility] ENDOSCOPY;  Service: Endoscopy;  Laterality: N/A;   ESOPHAGOGASTRODUODENOSCOPY (EGD) WITH PROPOFOL N/A 03/21/2021   Procedure: ESOPHAGOGASTRODUODENOSCOPY (EGD) WITH PROPOFOL;  Surgeon: Lesly Rubenstein, MD;  Location: [facility] ENDOSCOPY;  Service: Endoscopy;  Laterality: N/A;   LOBECTOMY  01/2007   Right lung, upper lobe right side removed   TOTAL ABDOMINAL HYSTERECTOMY  abnormal bleeding   TUBAL LIGATION  1980   Family History  Problem Relation Age of Onset   Coronary artery disease Mother        died @ 43 of MI.   Mental illness Mother    Diabetes Mother    Heart attack Father 62       MI   Cancer Father        lung - died in his mid-70's   Hypertension Father    Diabetes Father    Sudden death Brother    Mental illness Brother    Heart attack Brother        MI @ 28, died @ age 39.   Hypertension Child    Breast cancer Neg Hx    Social History   Socioeconomic History   Marital status: Widowed    Spouse name: Not on file   Number of children: Not on file   Years  of education: Not on file   Highest education level: Not on file  Occupational History   Not on file  Tobacco Use   Smoking status: Never    Passive exposure: Yes   Smokeless tobacco: Never   Tobacco comments:    husband and son smoked in her home.  Vaping Use   Vaping Use: Never used  Substance and Sexual Activity   Alcohol use: Not Currently    Alcohol/week: 0.0 standard drinks of alcohol   Drug use: No   Sexual activity: Never  Other Topics Concern   Not on file  Social History Narrative   Widowed.   Disabled (colon/lung CA), retired.   Lives in [place] with her dtr, grand-dtr, and son.   Active.   Gets regular exercise, walks.   Right handed    Social Determinants of Health   Financial Resource Strain: Low Risk  (11/29/2021)   Overall Financial Resource Strain (CARDIA)    Difficulty of Paying Living Expenses: Not hard at all  Food Insecurity: No Food Insecurity (11/29/2021)   Hunger Vital Sign    Worried About Running Out of Food in the Last Year: Never true    Ran Out of Food in the Last Year: Never true  Transportation Needs: No Transportation Needs (11/29/2021)   PRAPARE - Hydrologist (Medical): No    Lack of Transportation (Non-Medical): No  Physical Activity: Not on file  Stress: No Stress Concern Present (11/29/2021)   [place]    Feeling of Stress : Only a little  Social Connections: Unknown (11/29/2021)   Social Connection and Isolation Panel [NHANES]    Frequency of Communication with Friends and Family: More than three times a week    Frequency of Social Gatherings with Friends and Family: More than three times a week    Attends Religious Services: Not on Advertising copywriter or Organizations: Not on file    Attends Archivist Meetings: Not on file    Marital Status: Not on file     Review of Systems  Constitutional:  Negative for  fever.       Energy is better.    HENT:  Negative for sinus pressure.        No increased congestion or sinus pressure.   Respiratory:  Negative for chest tightness.        Breathing stable.  No increased sob.  No increased cough.   Cardiovascular:  Negative for chest pain and palpitations.       No increased lower extremity swelling.   Gastrointestinal:  Negative for abdominal pain, nausea and vomiting.  Genitourinary:  Negative for difficulty urinating and dysuria.  Musculoskeletal:  Negative for joint swelling and myalgias.       Knee pain as outlined.   Skin:  Negative for color change and rash.  Neurological:  Negative for dizziness and headaches.  Psychiatric/Behavioral:  Negative for agitation and dysphoric mood.        Objective:     BP 120/74   Pulse 69   Temp 97.8 F (36.6 C)   Resp 16   Ht 5\' 4"  (1.626 m)   Wt 165 lb 9.6 oz (75.1 kg)   SpO2 97%   BMI 28.43 kg/m  Wt Readings from Last 3 Encounters:  07/05/22 165 lb 9.6 oz (75.1 kg)  05/23/22 177 lb (80.3 kg)  05/20/22 169 lb 15.6 oz (77.1 kg)    Physical Exam Vitals reviewed.  Constitutional:      General: She is not in acute distress.    Appearance: Normal appearance.  HENT:     Head: Normocephalic and atraumatic.     Right Ear: External ear normal.     Left Ear: External ear normal.  Eyes:     General: No scleral icterus.       Right eye: No discharge.        Left eye: No discharge.     Conjunctiva/sclera: Conjunctivae normal.  Neck:     Thyroid: No thyromegaly.  Cardiovascular:     Rate and Rhythm: Normal rate and regular rhythm.  Pulmonary:     Effort: No respiratory distress.     Breath sounds: Normal breath sounds. No wheezing.  Abdominal:     General: Bowel sounds are normal.     Palpations: Abdomen is soft.     Tenderness: There is no abdominal tenderness.  Musculoskeletal:        General: No tenderness.     Cervical back: Neck supple. No tenderness.     Comments: No increased  swelling.   Lymphadenopathy:     Cervical: No cervical adenopathy.  Skin:    Findings: No erythema or rash.  Neurological:     Mental Status: She is alert.  Psychiatric:        Mood and Affect: Mood normal.        Behavior: Behavior normal.      Outpatient Encounter Medications as of 07/05/2022  Medication Sig   ALPRAZolam (XANAX) 0.5 MG tablet TAKE 1/2 TABLET BY MOUTH ONCE DAILY AS NEEDED   aspirin EC 81 MG tablet Take 81 mg by mouth as needed.    Carbidopa-Levodopa ER (SINEMET CR) 25-100 MG tablet controlled release Week 1:  1 tablet at 7am Week 2:  1 tablet at 7am/11am Week 3:  1 tablet at 7am/11am/4pm   citalopram (CELEXA) 10 MG tablet TAKE ONE AND ONE-HALF TABLET BY MOUTH DAILY   fluticasone-salmeterol (ADVAIR HFA) 115-21 MCG/ACT inhaler Inhale 2 puffs into the lungs 2 (two) times daily.   furosemide (LASIX) 20 MG tablet Take 1 tablet (20 mg total) by mouth daily.   HYDROcodone-acetaminophen (NORCO/VICODIN) 5-325 MG tablet Take 1 tablet by mouth every 6 (six) hours as needed for moderate pain.   montelukast (SINGULAIR) 10 MG tablet TAKE 1 TABLET BY MOUTH AT BEDTIME   PROAIR HFA 108 (90 Base) MCG/ACT inhaler INHALE 2 PUFFS BY MOUTH INTO THE LUNGS EVERY 6 HOURS  AS NEEDED FOR WHEEZING   rosuvastatin (CRESTOR) 5 MG tablet TAKE 1 TABLET BY MOUTH EVERY MONDAY, WEDNESDAY AND FRIDAY   traZODone (DESYREL) 50 MG tablet TAKE 2 TABLETS BY MOUTH AT BEDTIME   vitamin B-12 (CYANOCOBALAMIN) 100 MCG tablet Take 100 mcg by mouth daily.   [DISCONTINUED] amLODipine (NORVASC) 5 MG tablet Take 1.5 tablets (7.5 mg total) by mouth daily. (5 mg + 2.5 mg qd) (Patient not taking: Reported on 07/05/2022)   [DISCONTINUED] Rotigotine (NEUPRO) 1 MG/24HR PT24 Place 1 patch (1 mg total) onto the skin daily.   No facility-administered encounter medications on file as of 07/05/2022.     Lab Results  Component Value Date   WBC 10.7 (H) 05/20/2022   HGB 11.9 (L) 05/20/2022   HCT 37.2 05/20/2022   PLT 254  05/20/2022   GLUCOSE 104 (H) 07/05/2022   CHOL 207 (H) 03/09/2022   TRIG 77.0 03/09/2022   HDL 72.60 03/09/2022   LDLDIRECT 163.4 12/31/2012   LDLCALC 119 (H) 03/09/2022   ALT 18 05/20/2022   AST 23 05/20/2022   NA 140 07/05/2022   K 3.7 07/05/2022   CL 106 07/05/2022   CREATININE 0.98 07/05/2022   BUN 17 07/05/2022   CO2 26 07/05/2022   TSH 1.67 03/09/2022   INR 1.0 12/12/2012   HGBA1C 5.9 03/09/2022    US Venous Img Lower Unilateral Left  Result Date: 05/20/2022 CLINICAL DATA:  Pain and swelling of the left leg EXAM: Left LOWER EXTREMITY VENOUS DOPPLER ULTRASOUND TECHNIQUE: Gray-scale sonography with compression, as well as color and duplex ultrasound, were performed to evaluate the deep venous system(s) from the level of the common femoral vein through the popliteal and proximal calf veins. COMPARISON:  None Available. FINDINGS: VENOUS Normal compressibility of the common femoral, superficial femoral, and popliteal veins, as well as the visualized calf veins. Visualized portions of profunda femoral vein and great saphenous vein unremarkable. No filling defects to suggest DVT on grayscale or color Doppler imaging. Doppler waveforms show normal direction of venous flow, normal respiratory plasticity and response to augmentation. Limited views of the contralateral common femoral vein are unremarkable. OTHER None. Limitations: none IMPRESSION: Negative. Electronically Signed   By: Nelson Chimes M.D.   On: 05/20/2022 14:24   CT HEAD WO CONTRAST (5MM)  Result Date: 05/20/2022 CLINICAL DATA:  Neuro deficit, acute, stroke suspected EXAM: CT HEAD WITHOUT CONTRAST TECHNIQUE: Contiguous axial images were obtained from the base of the skull through the vertex without intravenous contrast. RADIATION DOSE REDUCTION: This exam was performed according to the departmental dose-optimization program which includes automated exposure control, adjustment of the mA and/or kV according to patient size and/or  use of iterative reconstruction technique. COMPARISON:  11/05/2021. FINDINGS: Brain: No evidence of acute infarction, hemorrhage, hydrocephalus, extra-axial collection or mass lesion/mass effect. Prominence of the ventricles, sulci and cisterns noted consistent with age-related involutional changes. Vascular: No hyperdense vessel or unexpected calcification. Skull: Normal. Negative for fracture or focal lesion. Sinuses/Orbits: No acute finding. IMPRESSION: No acute intracranial process. Electronically Signed   By: Sammie Bench M.D.   On: 05/20/2022 13:37   DG Chest Portable 1 View  Result Date: 05/20/2022 CLINICAL DATA:  weakness, leg swelling, hx CHF EXAM: PORTABLE CHEST - 1 VIEW COMPARISON:  11/20/2021 FINDINGS: Cardiac silhouette is unremarkable. No pneumothorax or pleural effusion. The lungs are clear. The visualized skeletal structures are unremarkable. IMPRESSION: No acute cardiopulmonary process. Electronically Signed   By: Sammie Bench M.D.   On: 05/20/2022 12:55  Assessment & Plan:  Primary hypertension Assessment & Plan: Varying pressures.  Hold on additional medication.  Off amlodipine. She did inform me she was taking lasix daily.  Check metabolic panel today. Follow pressures.    Orders: -     Basic metabolic panel  Anxiety, mild Assessment & Plan: Continue celexa.  Follow. Doing better.  Feels better.    Gastroesophageal reflux disease without esophagitis Assessment & Plan: Upper symptoms appear to be controlled on prilosec.  Follow.    Anemia, unspecified type Assessment & Plan: Follow cbc.    Aortic atherosclerosis (HCC) Assessment & Plan: Crestor.      Bilateral carotid artery disease, unspecified type ([place]) Assessment & Plan: Previously evaluated by [facility].  Continue crestor.     Stage 3 chronic kidney disease, unspecified whether stage 3a or 3b CKD ([place]) Assessment & Plan: Avoid antiinflammatories.  Follow pressures.  Off micardis due to low  blood pressure.  Follow. Follow metabolic panel. Reports is taking lasix daily. Check metabolic panel today to confirm potassium and kidney function wnl.    Depression, recurrent ([place]) Assessment & Plan: Continue citalopram.   Follow. Doing better.    Elevated liver function tests Assessment & Plan: Follow liver function tests.    Fibromuscular dysplasia ([place]) Assessment & Plan: Has been evaluated by [facility].  Continue risk factor modificaiton.    Hepatic steatosis Assessment & Plan: Diet and exercise.  Follow liver panel. Seeing on chest CT angio - 09/11/21   History of colon cancer Assessment & Plan: Recent attempt at colonoscopy - poor prep.   Fiber and probiotics to help with GI symptoms. GI recommended follow cbc and iron studies.    History of malignant neoplasm metastatic to lung Assessment & Plan: S/p lobectomy.  Followed by pulmonary.  Breathing stable.    History of total left knee replacement Assessment & Plan: S/p left TKA.  Has been followed by [facility]. Persistent increased pain.  Request to see Dr Alluisio.     Hypercholesterolemia Assessment & Plan: Crestor.  Low cholesterol diet and exercise. Follow lipid panel and liver function tests.    Hyperglycemia Assessment & Plan: Low carb diet and exercise.  Follow met b and a1c.    Orthostatic hypotension due to Parkinson's disease [facility]) Assessment & Plan: Overall doing better.  Following pressures.     Parkinson's disease, unspecified whether dyskinesia present, unspecified whether manifestations fluctuate Assessment & Plan: Seeing Dr Tat.  On sinemet CR bid now.  Did not tolerate tid dosing.  Doing better.  Feels better.  Continue therapy.    Left knee pain, unspecified chronicity -     Ambulatory referral to Orthopedic Surgery     Einar Pheasant, MD

## 2022-07-06 ENCOUNTER — Encounter: Payer: 59 | Admitting: Occupational Therapy

## 2022-07-06 ENCOUNTER — Ambulatory Visit: Payer: 59 | Admitting: Occupational Therapy

## 2022-07-06 LAB — BASIC METABOLIC PANEL
BUN: 17 mg/dL (ref 6–23)
CO2: 26 mEq/L (ref 19–32)
Calcium: 8.9 mg/dL (ref 8.4–10.5)
Chloride: 106 mEq/L (ref 96–112)
Creatinine, Ser: 0.98 mg/dL (ref 0.40–1.20)
GFR: 57.62 mL/min — ABNORMAL LOW (ref >60.00)
Glucose, Bld: 104 mg/dL — ABNORMAL HIGH (ref 70–99)
Potassium: 3.7 mEq/L (ref 3.5–5.1)
Sodium: 140 mEq/L (ref 135–145)

## 2022-07-07 NOTE — Therapy (Signed)
OUTPATIENT OCCUPATIONAL THERAPY PARKINSON'S TREATMENT/RECERTIFICATION Patient Name: Victoria Hanson MRN: [id number] DOB:20-May-1951, 72 y.o., female Today's Date: 07/04/2022  PCP: Einar Pheasant REFERRING PROVIDER: Alonza Bogus   END OF SESSION:  OT End of Session - 07/07/22 2113     Visit Number 12    Number of Visits 17    Date for OT Re-Evaluation 07/27/22    OT Start Time 1300    OT Stop Time 1401    OT Time Calculation (min) 61 min    Activity Tolerance Patient tolerated treatment well    Behavior During Therapy [facility] for tasks assessed/performed             Past Medical History:  Diagnosis Date   Anxiety    Asthma    Atypical chest pain    a. 2003 Cath: reportedly nl per pt [name]);  b. 04/2015 [place] Admission, neg troponin.   Colon cancer ([place]) 1999   a. 1999: Pt had partial colectomy. Did develop lung metastasis. Had right upper lobectomy in 01/2007 then had associated chemotherapy.   COPD (chronic obstructive pulmonary disease) (HCC)    DDD (degenerative disc disease), lumbosacral    Depression    Echocardiogram abnormal    a. 02/2010 Echo: EF 55-60%, mild LAE, no regional wall motion abnormalities   Elevated transaminase level    a. ? NASH;  b. 06/2015 - nl LFTs.   GERD (gastroesophageal reflux disease)    Hematuria    Holter monitor, abnormal    a. 12/2009 Holter monitoring: NSR, rare PACs and PVCs.   Hypercholesterolemia    Hyperlipidemia    Hypertension    Leaky heart valve    a. Per pt report - no evidence of valvular abnormalities on prior echoes.   Lesion of skin of Right breast 10/20/2017   Lung metastases 2008   a. S/P resection (lobectomy - right)   Morbid obesity ([place])    Nephrolithiasis    pt denies   Parkinson disease 08/2016   [name]   Past Surgical History:  Procedure Laterality Date   BREAST EXCISIONAL BIOPSY Right 10/07/2009   benign   CARDIAC CATHETERIZATION  2003   negative as per pt report   CHOLECYSTECTOMY  2000   COLON  SURGERY     Colon cancer, removed part of large colon   COLONOSCOPY N/A 04/25/2015   Procedure: COLONOSCOPY;  Surgeon: Manya Silvas, MD;  Location: [place];  Service: Endoscopy;  Laterality: N/A;   COLONOSCOPY WITH PROPOFOL N/A 06/14/2017   Procedure: COLONOSCOPY WITH PROPOFOL;  Surgeon: Manya Silvas, MD;  Location: [facility] ENDOSCOPY;  Service: Endoscopy;  Laterality: N/A;   COLONOSCOPY WITH PROPOFOL N/A 11/17/2019   Procedure: COLONOSCOPY WITH PROPOFOL;  Surgeon: [name], MD;  Location: [facility] ENDOSCOPY;  Service: Gastroenterology;  Laterality: N/A;   COLONOSCOPY WITH PROPOFOL N/A 03/21/2021   Procedure: COLONOSCOPY WITH PROPOFOL;  Surgeon: Lesly Rubenstein, MD;  Location: [facility] ENDOSCOPY;  Service: Endoscopy;  Laterality: N/A;   ESOPHAGOGASTRODUODENOSCOPY (EGD) WITH PROPOFOL N/A 03/21/2021   Procedure: ESOPHAGOGASTRODUODENOSCOPY (EGD) WITH PROPOFOL;  Surgeon: Lesly Rubenstein, MD;  Location: [facility] ENDOSCOPY;  Service: Endoscopy;  Laterality: N/A;   LOBECTOMY  01/2007   Right lung, upper lobe right side removed   TOTAL ABDOMINAL HYSTERECTOMY     abnormal bleeding   TUBAL LIGATION  1980   Patient Active Problem List   Diagnosis Date Noted   Hypophonia 05/23/2022   Left knee pain 05/02/2022   Parkinson disease 03/12/2022   Change in bowel  movement 02/05/2022   Lower extremity edema 01/30/2022   Cloudy urine 11/29/2021   History of total knee arthroplasty 09/13/2021   Hepatic steatosis 09/13/2021   Urinary frequency 03/26/2021   Right knee pain 01/01/2021   UTI (urinary tract infection) 11/27/2020   Vitamin D deficiency 11/05/2020   Aortic atherosclerosis ([place]) 11/05/2020   Dizziness 08/24/2020   Cardiomegaly 08/24/2020   Constipation 11/11/2019   Anemia 11/11/2019   Near syncope 02/13/2019   Fibromuscular dysplasia ([place]) 02/02/2019   Abdominal bloating 01/04/2019   Unsteady gait 12/03/2018   History of UTI 10/08/2018   Ear fullness, right 10/08/2018   B12  deficiency 03/11/2018   Goals of care, counseling/discussion 10/15/2017   Orthostatic hypotension due to Parkinson's disease ([place]) 07/31/2017   COPD exacerbation ([place]) 07/31/2017   REM behavioral disorder 05/28/2017   Elevated liver function tests 08/25/2016   Carotid artery disease ([place]) 07/19/2016   Tremor 06/01/2016   Osteoporosis 04/08/2016   Foot pain, bilateral 07/24/2015   Head pain 07/24/2015   Hyperlipidemia 06/21/2015   Other fatigue 06/21/2015   BMI 33.0-33.9,adult 06/21/2015   Disorder of kidney and ureter 03/28/2015   Hyperglycemia 03/20/2015   Swelling of lower extremity 11/20/2014   Dysuria 10/24/2014   Health care maintenance 07/17/2014   Hypokalemia 04/05/2013   TIA (transient ischemic attack) 02/09/2013   Transient cerebral ischemia 02/09/2013   Anxiety, mild 03/13/2012   Depression, recurrent ([place]) 03/13/2012   History of colon cancer 03/13/2012   Hypertension 03/13/2012   CKD (chronic kidney disease), stage III ([place]) 03/13/2012   GERD (gastroesophageal reflux disease) 03/13/2012   Acid reflux 03/13/2012   History of malignant neoplasm metastatic to lung 03/13/2012   Major depressive disorder with single episode 03/13/2012   Hypercholesterolemia 02/02/2010   SOB (shortness of breath) on exertion 02/02/2010   Pure hypercholesterolemia 02/02/2010    ONSET DATE: 05/23/2022, diagnosed with Parkinson's in 2018  REFERRING DIAG: Parkinson's disease, LSVT BIG  THERAPY DIAG:  Muscle weakness (generalized)  Other lack of coordination  Unsteadiness on feet  Abnormal posture  Difficulty in walking, not elsewhere classified  Rationale for Evaluation and Treatment: Rehabilitation  SUBJECTIVE:   SUBJECTIVE STATEMENT: Patient reports she missed last weeks appointments due to being sick with Covid. She reports many of the people in her household were also sick. She is feeling better, but still has some shortness of breath with activity.    PERTINENT  HISTORY: Pt diagnosed with Parkinson's disease in 2018.  Pt with multiple visits to the ER this year with BP issues, UTI, weakness and changes in medication.  Pt requiring increased assistance from family with all self care and mobility.  She had a knee replacement on the left March 2023.  Tremors in bilateral hands.Pt has a history of hypotension.  PRECAUTIONS: Fall, Pt has a history of Neurogenic Orthostatic Hypotension  WEIGHT BEARING RESTRICTIONS: No  PAIN:  Are you having pain? No  FALLS: Has patient fallen in last 6 months? No  LIVING ENVIRONMENT: Lives with: lives with their family Lives in: House/apartment Stairs: No Has following equipment at home: Walker - 2 wheeled, shower chair, bed side commode, and Grab bars  PLOF: Needs assistance with ADLs, Needs assistance with homemaking, and Needs assistance with transfers  PATIENT GOALS: Pt reports she would like to be as independent as possible, do more for herself  OBJECTIVE:   HAND DOMINANCE: Right  ADLs: Overall ADLs: Pt reports her daughter helps her with bathing and dressing Transfers/ambulation related to ADLs: Eating: Able  to feed self independently Grooming: Able to complete grooming with modified independence, difficulty with standing for long periods of time UB Dressing: min to mod assist LB Dressing: mod to max assist Toileting: min to mod assist  Bathing: max assist Tub Shower transfers: mod assist, has shower chair and grab bar in shower. Equipment: Shower seat with back, Grab bars, and bed side commode  IADLs: Shopping: Dependent Light housekeeping: Reports she tries to help wash dishes but cannot stand very long Meal Prep: Max to dependent  Community mobility: does not drive, family provides transportation, ambulates with rolling walker  Medication management: TBA Financial management: TBA Handwriting: 25% legible, demonstrates severe micrographia  MOBILITY STATUS: Needs Assist: assist with sit to  stand transfer, ambulates with RW, difficulty with turns, and start hesitation, freezing of gait noted.  POSTURE COMMENTS:  rounded shoulders and forward head  ACTIVITY TOLERANCE: Activity tolerance: Fair, patient requires frequent rest breaks during evaluation and during mobility tasks  FUNCTIONAL OUTCOME MEASURES: FOTO 6 min walk test 350 feet, 10th visit: 900 feet 5 times sit to stand unable to perform sit to stand without use of arms, 10th visit: 15 secs BERG balance: 21/56, 10th visit: 29/56  COORDINATION: Finger Nose Finger test: impaired  9 Hole Peg test: Right: 50 sec; Left: 56 sec  UE/LE ROM:   Pt with limited ROM of bilateral UEs, LE:  right knee extension full, left 30 degrees of knee extension and limited dorsiflexion of knee  UE MMT:    3/5 overall strength in UE, LE:  right 3/5, left 2-/5  MUSCLE TONE: Increased muscle tone L greater than R, UE greater than LE.    SENSATION: WFL  COGNITION: Overall cognitive status: Within functional limits for tasks assessed  OBSERVATIONS: Bradykinesia   TODAY'S TREATMENT:                                                                                                                              DATE: 07/04/2022  Neuromuscular Reeducation:  Patient seen for initial instruction of LSVT BIG exercises: LSVT Daily Session Maximal Daily Exercises: Sustained movements are designed to rescale the amplitude of movement output for generalization to daily functional activities. Performed as follows for 1 set of 10 repetitions each: Multi directional sustained movements- 1) Floor to ceiling, 2) Side to side. Multi directional Repetitive movements performed in standing and are designed to provide retraining effort needed for sustained muscle activation in tasks Performed as follows: 3) Step and reach forward, 4) Step and Reach Backwards, 5) Step and reach sideways, 6) Rock and reach forward/backward, 7) Rock and reach sideways. Sit to stand  from mat table on lowest setting with cues for weight shift, technique and min to mod assist for 10 reps for 1 set.    All exercises were performed in an ADAPTED version of the LSVT BIG protocol for maximal daily exercises.  The adapted version utilizes a chair to assist with balance.  Pt  requiring min assist to min guard for exercises in standing for transitional movements and maintaining balance.  Pt requires verbal cues and tactile cues for proper form and technique.  Posture exercises in sitting and standing at the wall, cues for shoulder retraction and promotion of upright posture, completed 5 reps each.    Functional mobility: Pt seen for functional mobility without the use of an AD for 300 feet with SBA.   Pt reports fatigue and required increased rest breaks Continued cues for step height and length for greater amplitude of movement.  Turns remain wide and working on pivot turns with assist.   Car transfer with min assist to min guard and cues managing lower extremities to get into vehicle.  Stepping up and down on curb with min guard and cues for stepping patterns with HHA this date.   7  hand exercises to help promote handwriting, dexterity and flexibility. 1)  Fisting with arm then arm extension with fingers extended with BIG hand movements 2) oppositional movements of the thumb to each digit 3) wrist flex/ext with elbows extended 4) supination/pronation with elbows bent 5) tendon gliding with hook fist and then finger extension 6) tendon gliding with tabletop movement of fingers with MP flexion, PIP and DIP extension 7) MP flexion, PIP flexion, DIP extension Performed with therapist demo and min cues for form  Functional component tasks: Manipulation of buttons for 5 reps each with increased time to complete task, completed regular buttons and metal ones, hand flicks prior to task. Stepping up and down from curb min guard and cues Sit to stand with min assist to min guard without  the use of arms Handwriting with name on lined paper with cues for size of letter formation Car transfer with min assist to min guard with cues Scooting to edge of the chair to prepare for sit to stand, occasional verbal cues     PATIENT EDUCATION: Education details: LSVT BIG program, POC, goals, role of OT Person educated: Patient Education method: Explanation Education comprehension: verbalized understanding  HOME EXERCISE PROGRAM: LSVT BIG maximal daily exercises, functional component tasks  GOALS: Goals reviewed with patient? Yes  SHORT TERM GOALS: Target date: 06/08/2022  Pt will demonstrate sit to stand from chair with modified independence, using arm rest as needed to push up from chair.  Baseline: min to mod assist, 10th visit able to complete Goal status: MET   LONG TERM GOALS: Target date: 07/27/2022  Patient will improve gait speed and endurance to be able to ambulate 1000 feet in 6 minutes to negotiate around the home and community safely in 4 weeks.  Baseline: Eval 350 feet , 10th visit: 900 feet, goal revised Goal status: REVISED IN PROGRESS  2.  Patient will complete HEP for maximal daily exercises with modified independence in 4 weeks.  Baseline: Eval-no current program, 10th visit:  min assist, goal revised Goal status: REVISED IN PROGRESS  3.  Patient will transfer from to sit to stand safely and independently from a variety of chairs/surfaces in 4 weeks.  Baseline: unable at eval, 10th visit: working towards lower surface heights Goal status: IN PROGRESS  4.  Pt will complete dressing with min assist  Baseline: mod to max assist at eval , 10th visit: min to mod assist Goal status: IN PROGRESS  5.  Pt will complete bathing with min assist  Baseline: max assist, 10th visit:  mod assist Goal status: IN PROGRESS  6.  Pt will complete standing at the sink  for up to 10 mins to wash dishes with SBA. Baseline: Difficulty standing to wash dishes, can stand  1-3 mins, 10th visit:  able to stand for up to 5 mins, revise goal to 10 mins. Goal status: REVISED IN PROGRESS  ASSESSMENT:  CLINICAL IMPRESSION: Patient arrived to the clinic today, ambulating without the use of her walker. She reports she has been able to walk around her home without an assistive device for the past couple of weeks. Despite being sick with Covid, patient continues to demonstrate significant improvements in all areas. She has progressed from arriving at her evaluation in a wheelchair to now ambulating short distances without an assistive device. Patient strength with transfers has continued to improve and she is now able to transition from sit to stand without the use of arms 75% of the time. She would like to finish out the intensive portion of LSVT BIG, and will also participate in LSVT LOUD in the month of February. Pt continues to benefit from skilled OT services and participation in intensive LSVT BIG program, continue to work towards goals and plan of care to maximize safety and independence and necessary daily tasks.   PERFORMANCE DEFICITS: in functional skills including ADLs, IADLs, coordination, dexterity, ROM, strength, flexibility, Fine motor control, Gross motor control, mobility, balance, endurance, decreased knowledge of use of DME, and UE functional use, cognitive skills including attention and memory, and psychosocial skills including environmental adaptation, habits, and routines and behaviors.   IMPAIRMENTS: are limiting patient from ADLs, IADLs, leisure, and social participation.   COMORBIDITIES:  has co-morbidities such as orthostatic hypotension, CAD, syncope, knee pain, unsteady gait, fall risk, COPD, osteoporosis,   that affects occupational performance. Patient will benefit from skilled OT to address above impairments and improve overall function.  MODIFICATION OR ASSISTANCE TO COMPLETE EVALUATION: Min-Moderate modification of tasks or assist with assess  necessary to complete an evaluation.  OT OCCUPATIONAL PROFILE AND HISTORY: Detailed assessment: Review of records and additional review of physical, cognitive, psychosocial history related to current functional performance.  CLINICAL DECISION MAKING: High - multiple treatment options, significant modification of task necessary  REHAB POTENTIAL: Good  EVALUATION COMPLEXITY: High    PLAN:  OT FREQUENCY: 4x/week  OT DURATION: 4 weeks  PLANNED INTERVENTIONS: self care/ADL training, therapeutic exercise, therapeutic activity, neuromuscular re-education, manual therapy, passive range of motion, gait training, balance training, stair training, and functional mobility training  RECOMMENDED OTHER SERVICES: LSVT LOUD with SLP  CONSULTED AND AGREED WITH PLAN OF CARE: Patient and family member/caregiver  PLAN FOR NEXT SESSION: Will continue with maximal daily exercises, posture exercises, add functional component tasks.  Shannen [place] Oneita Jolly, OTR/L, CLT  Jakelyn Squyres, OT 07/07/2022, 9:18 PM

## 2022-07-08 ENCOUNTER — Encounter: Payer: Self-pay | Admitting: Internal Medicine

## 2022-07-08 NOTE — Assessment & Plan Note (Signed)
Continue citalopram.   Follow. Doing better.

## 2022-07-08 NOTE — Assessment & Plan Note (Signed)
Seeing Dr Tat.  On sinemet CR bid now.  Did not tolerate tid dosing.  Doing better.  Feels better.  Continue therapy.

## 2022-07-08 NOTE — Assessment & Plan Note (Signed)
Varying pressures.  Hold on additional medication.  Off amlodipine. She did inform me she was taking lasix daily.  Check metabolic panel today. Follow pressures.

## 2022-07-08 NOTE — Assessment & Plan Note (Signed)
Low carb diet and exercise.  Follow met b and a1c.  

## 2022-07-08 NOTE — Assessment & Plan Note (Signed)
Has been evaluated by [facility].  Continue risk factor modificaiton.

## 2022-07-08 NOTE — Assessment & Plan Note (Signed)
Follow cbc.  

## 2022-07-08 NOTE — Assessment & Plan Note (Signed)
Follow liver function tests.

## 2022-07-08 NOTE — Assessment & Plan Note (Signed)
Recent attempt at colonoscopy - poor prep.   Fiber and probiotics to help with GI symptoms. GI recommended follow cbc and iron studies.

## 2022-07-08 NOTE — Assessment & Plan Note (Signed)
Diet and exercise.  Follow liver panel. Seeing on chest CT angio - 09/11/21

## 2022-07-08 NOTE — Assessment & Plan Note (Signed)
Overall doing better.  Following pressures.

## 2022-07-08 NOTE — Assessment & Plan Note (Signed)
S/p lobectomy.  Followed by pulmonary.  Breathing stable.

## 2022-07-08 NOTE — Assessment & Plan Note (Signed)
Crestor.  Low cholesterol diet and exercise. Follow lipid panel and liver function tests.

## 2022-07-08 NOTE — Addendum Note (Signed)
Addended by: Alisa Graff on: 07/08/2022 11:32 AM   Modules accepted: Orders

## 2022-07-08 NOTE — Assessment & Plan Note (Signed)
Continue celexa.  Follow. Doing better.  Feels better.

## 2022-07-08 NOTE — Assessment & Plan Note (Signed)
- 

## 2022-07-08 NOTE — Assessment & Plan Note (Signed)
S/p left TKA.  Has been followed by [facility]. Persistent increased pain.  Request to see Dr Alluisio.

## 2022-07-08 NOTE — Assessment & Plan Note (Signed)
Upper symptoms appear to be controlled on prilosec.  Follow.

## 2022-07-08 NOTE — Assessment & Plan Note (Addendum)
Avoid antiinflammatories.  Follow pressures.  Off micardis due to low blood pressure.  Follow. Follow metabolic panel. Reports is taking lasix daily. Check metabolic panel today to confirm potassium and kidney function wnl.

## 2022-07-08 NOTE — Assessment & Plan Note (Signed)
Previously evaluated by [facility].  Continue crestor.

## 2022-07-09 ENCOUNTER — Ambulatory Visit: Payer: 59 | Admitting: Occupational Therapy

## 2022-07-09 DIAGNOSIS — M6281 Muscle weakness (generalized): Secondary | ICD-10-CM | POA: Diagnosis not present

## 2022-07-09 DIAGNOSIS — R278 Other lack of coordination: Secondary | ICD-10-CM | POA: Diagnosis not present

## 2022-07-09 DIAGNOSIS — R262 Difficulty in walking, not elsewhere classified: Secondary | ICD-10-CM

## 2022-07-09 DIAGNOSIS — R293 Abnormal posture: Secondary | ICD-10-CM | POA: Diagnosis not present

## 2022-07-09 DIAGNOSIS — R2681 Unsteadiness on feet: Secondary | ICD-10-CM

## 2022-07-10 ENCOUNTER — Ambulatory Visit: Payer: 59 | Admitting: Occupational Therapy

## 2022-07-10 DIAGNOSIS — R2681 Unsteadiness on feet: Secondary | ICD-10-CM

## 2022-07-10 DIAGNOSIS — R278 Other lack of coordination: Secondary | ICD-10-CM

## 2022-07-10 DIAGNOSIS — R293 Abnormal posture: Secondary | ICD-10-CM | POA: Diagnosis not present

## 2022-07-10 DIAGNOSIS — R262 Difficulty in walking, not elsewhere classified: Secondary | ICD-10-CM | POA: Diagnosis not present

## 2022-07-10 DIAGNOSIS — M6281 Muscle weakness (generalized): Secondary | ICD-10-CM | POA: Diagnosis not present

## 2022-07-11 ENCOUNTER — Encounter: Payer: Self-pay | Admitting: Occupational Therapy

## 2022-07-11 NOTE — Therapy (Signed)
OUTPATIENT OCCUPATIONAL THERAPY [facility]  Patient Name: Victoria Hanson MRN: [id number] DOB:[birth year], 72 y.o., female Today's Date: 07/09/2022  PCP: Einar Pheasant REFERRING PROVIDER: Alonza Bogus   END OF SESSION:  OT End of Session - 07/11/22 1853     Visit Number 13    Number of Visits 17    Date for OT Re-Evaluation 07/27/22    OT Start Time 12    OT Stop Time 1300    OT Time Calculation (min) 40 min    Activity Tolerance Patient tolerated treatment well    Behavior During Therapy [facility] for tasks assessed/performed             Past Medical History:  Diagnosis Date   Anxiety    Asthma    Atypical chest pain    a. 2003 Cath: reportedly nl per pt [name]);  b. 04/2015 [facility] Admission, neg troponin.   Colon cancer ([place]) 1999   a. 1999: Pt had partial colectomy. Did develop lung metastasis. Had right upper lobectomy in 01/2007 then had associated chemotherapy.   COPD (chronic obstructive pulmonary disease) (HCC)    DDD (degenerative disc disease), lumbosacral    Depression    Echocardiogram abnormal    a. 02/2010 Echo: EF 55-60%, mild LAE, no regional wall motion abnormalities   Elevated transaminase level    a. ? NASH;  b. 06/2015 - nl LFTs.   GERD (gastroesophageal reflux disease)    Hematuria    Holter monitor, abnormal    a. 12/2009 Holter monitoring: NSR, rare PACs and PVCs.   Hypercholesterolemia    Hyperlipidemia    Hypertension    Leaky heart valve    a. Per pt report - no evidence of valvular abnormalities on prior echoes.   Lesion of skin of Right breast 10/20/2017   Lung metastases 2008   a. S/P resection (lobectomy - right)   Morbid obesity ([place])    Nephrolithiasis    pt denies   Parkinson disease 08/2016   [name]   Past Surgical History:  Procedure Laterality Date   BREAST EXCISIONAL BIOPSY Right 10/07/2009   benign   CARDIAC CATHETERIZATION  2003   negative as per pt report   CHOLECYSTECTOMY  2000   COLON SURGERY      Colon cancer, removed part of large colon   COLONOSCOPY N/A 04/25/2015   Procedure: COLONOSCOPY;  Surgeon: Manya Silvas, MD;  Location: [place];  Service: Endoscopy;  Laterality: N/A;   COLONOSCOPY WITH PROPOFOL N/A 06/14/2017   Procedure: COLONOSCOPY WITH PROPOFOL;  Surgeon: Manya Silvas, MD;  Location: [facility] ENDOSCOPY;  Service: Endoscopy;  Laterality: N/A;   COLONOSCOPY WITH PROPOFOL N/A 11/17/2019   Procedure: COLONOSCOPY WITH PROPOFOL;  Surgeon: [name], MD;  Location[facility]COPY;  Service: Gastroenterology;  Laterality: N/A;   COLONOSCOPY WITH PROPOFOL N/A 03/21/2021   Procedure: COLONOSCOPY WITH PROPOFOL;  Surgeon: Lesly Rubenstein, MD;  Location: [facility] ENDOSCOPY;  Service: Endoscopy;  Laterality: N/A;   ESOPHAGOGASTRODUODENOSCOPY (EGD) WITH PROPOFOL N/A 03/21/2021   Procedure: ESOPHAGOGASTRODUODENOSCOPY (EGD) WITH PROPOFOL;  Surgeon: Lesly Rubenstein, MD;  Location: [facility] ENDOSCOPY;  Service: Endoscopy;  Laterality: N/A;   LOBECTOMY  01/2007   Right lung, upper lobe right side removed   TOTAL ABDOMINAL HYSTERECTOMY     abnormal bleeding   TUBAL LIGATION  1980   Patient Active Problem List   Diagnosis Date Noted   Hypophonia 05/23/2022   Left knee pain 05/02/2022   Parkinson disease 03/12/2022   Change in  bowel movement 02/05/2022   Lower extremity edema 01/30/2022   Cloudy urine 11/29/2021   History of total knee arthroplasty 09/13/2021   Hepatic steatosis 09/13/2021   Urinary frequency 03/26/2021   Right knee pain 01/01/2021   UTI (urinary tract infection) 11/27/2020   Vitamin D deficiency 11/05/2020   Aortic atherosclerosis ([place]) 11/05/2020   Dizziness 08/24/2020   Cardiomegaly 08/24/2020   Constipation 11/11/2019   Anemia 11/11/2019   Near syncope 02/13/2019   Fibromuscular dysplasia ([place]) 02/02/2019   Abdominal bloating 01/04/2019   Unsteady gait 12/03/2018   History of UTI 10/08/2018   Ear fullness, right 10/08/2018   B12 deficiency  03/11/2018   Goals of care, counseling/discussion 10/15/2017   Orthostatic hypotension due to Parkinson's disease ([place]) 07/31/2017   COPD exacerbation ([place]) 07/31/2017   REM behavioral disorder 05/28/2017   Elevated liver function tests 08/25/2016   Carotid artery disease ([place]) 07/19/2016   Tremor 06/01/2016   Osteoporosis 04/08/2016   Foot pain, bilateral 07/24/2015   Head pain 07/24/2015   Hyperlipidemia 06/21/2015   Other fatigue 06/21/2015   BMI 33.0-33.9,adult 06/21/2015   Disorder of kidney and ureter 03/28/2015   Hyperglycemia 03/20/2015   Swelling of lower extremity 11/20/2014   Dysuria 10/24/2014   Health care maintenance 07/17/2014   Hypokalemia 04/05/2013   TIA (transient ischemic attack) 02/09/2013   Transient cerebral ischemia 02/09/2013   Anxiety, mild 03/13/2012   Depression, recurrent ([place]) 03/13/2012   History of colon cancer 03/13/2012   Hypertension 03/13/2012   CKD (chronic kidney disease), stage III ([place]) 03/13/2012   GERD (gastroesophageal reflux disease) 03/13/2012   Acid reflux 03/13/2012   History of malignant neoplasm metastatic to lung 03/13/2012   Major depressive disorder with single episode 03/13/2012   Hypercholesterolemia 02/02/2010   SOB (shortness of breath) on exertion 02/02/2010   Pure hypercholesterolemia 02/02/2010    ONSET DATE: 05/23/2022, diagnosed with Parkinson's in 2018  REFERRING DIAG: Parkinson's disease, LSVT BIG  THERAPY DIAG:  Muscle weakness (generalized)  Unsteadiness on feet  Other lack of coordination  Difficulty in walking, not elsewhere classified  Abnormal posture  Rationale for Evaluation and Treatment: Rehabilitation  SUBJECTIVE:   SUBJECTIVE STATEMENT: Patient reports she has not been consistent with performing her home exercise program this week due to having some stomach issues, but she has practiced with transitional movements from sit to stand and functional mobility. Patient reports she was late  to therapy today due to having stomach issues and had to turn around and go back home before proceeding to the clinic. Therapist was unable to accommodate a full session secondary to schedule restrictions with patient arriving late.     PERTINENT HISTORY: Pt diagnosed with Parkinson's disease in 2018.  Pt with multiple visits to the ER this year with BP issues, UTI, weakness and changes in medication.  Pt requiring increased assistance from family with all self care and mobility.  She had a knee replacement on the left March 2023.  Tremors in bilateral hands.Pt has a history of hypotension.  PRECAUTIONS: Fall, Pt has a history of Neurogenic Orthostatic Hypotension  WEIGHT BEARING RESTRICTIONS: No  PAIN:  Are you having pain? No  FALLS: Has patient fallen in last 6 months? No  LIVING ENVIRONMENT: Lives with: lives with their family Lives in: House/apartment Stairs: No Has following equipment at home: Gilford Rile - 2 wheeled, shower chair, bed side commode, and Grab bars  PLOF: Needs assistance with ADLs, Needs assistance with homemaking, and Needs assistance with transfers  PATIENT GOALS:  Pt reports she would like to be as independent as possible, do more for herself  OBJECTIVE:   HAND DOMINANCE: Right  ADLs: Overall ADLs: Pt reports her daughter helps her with bathing and dressing Transfers/ambulation related to ADLs: Eating: Able to feed self independently Grooming: Able to complete grooming with modified independence, difficulty with standing for long periods of time UB Dressing: min to mod assist LB Dressing: mod to max assist Toileting: min to mod assist  Bathing: max assist Tub Shower transfers: mod assist, has shower chair and grab bar in shower. Equipment: Shower seat with back, Grab bars, and bed side commode  IADLs: Shopping: Dependent Light housekeeping: Reports she tries to help wash dishes but cannot stand very long Meal Prep: Max to dependent  Community mobility:  does not drive, family provides transportation, ambulates with rolling walker  Medication management: TBA Financial management: TBA Handwriting: 25% legible, demonstrates severe micrographia  MOBILITY STATUS: Needs Assist: assist with sit to stand transfer, ambulates with RW, difficulty with turns, and start hesitation, freezing of gait noted.  POSTURE COMMENTS:  rounded shoulders and forward head  ACTIVITY TOLERANCE: Activity tolerance: Fair, patient requires frequent rest breaks during evaluation and during mobility tasks  FUNCTIONAL OUTCOME MEASURES: FOTO 6 min walk test 350 feet, 10th visit: 900 feet 5 times sit to stand unable to perform sit to stand without use of arms, 10th visit: 15 secs BERG balance: 21/56, 10th visit: 29/56  COORDINATION: Finger Nose Finger test: impaired  9 Hole Peg test: Right: 50 sec; Left: 56 sec  UE/LE ROM:   Pt with limited ROM of bilateral UEs, LE:  right knee extension full, left 30 degrees of knee extension and limited dorsiflexion of knee  UE MMT:    3/5 overall strength in UE, LE:  right 3/5, left 2-/5  MUSCLE TONE: Increased muscle tone L greater than R, UE greater than LE.    SENSATION: WFL  COGNITION: Overall cognitive status: Within functional limits for tasks assessed  OBSERVATIONS: Bradykinesia   TODAY'S TREATMENT:                                                                                                                              DATE: 07/09/2022  Neuromuscular Reeducation:  Patient seen for initial instruction of LSVT BIG exercises: LSVT Daily Session Maximal Daily Exercises: Sustained movements are designed to rescale the amplitude of movement output for generalization to daily functional activities. Performed as follows for 1 set of 10 repetitions each: Multi directional sustained movements- 1) Floor to ceiling, 2) Side to side. Multi directional Repetitive movements performed in standing and are designed to provide  retraining effort needed for sustained muscle activation in tasks Performed as follows: 3) Step and reach forward, 4) Step and Reach Backwards, 5) Step and reach sideways, 6) Rock and reach forward/backward, 7) Rock and reach sideways. Sit to stand from mat table on lowest setting with cues for weight shift, technique and min  to mod assist for 10 reps for 1 set.    All exercises were performed in an ADAPTED version of the LSVT BIG protocol for maximal daily exercises.  The adapted version utilizes a chair to assist with balance.  Pt requiring min assist to min guard for exercises in standing for transitional movements and maintaining balance.  Pt requires verbal cues and tactile cues for proper form and technique.  Posture exercises in sitting and standing at the wall, cues for shoulder retraction and promotion of upright posture, completed 5 reps each.    Functional mobility: Pt seen for functional mobility without the use of a walker this date 2 trials of 300 feet. Continued cues for step height and length for greater amplitude of movement.  Turns remain wide and working on pivot turns with assist.   Car transfer with min assist to min guard and cues managing lower extremities to get into vehicle.  Stepping up and down on curb with min guard and cues for stepping patterns.   7  hand exercises to help promote handwriting, dexterity and flexibility. 1)  Fisting with arm then arm extension with fingers extended with BIG hand movements 2) oppositional movements of the thumb to each digit 3) wrist flex/ext with elbows extended 4) supination/pronation with elbows bent 5) tendon gliding with hook fist and then finger extension 6) tendon gliding with tabletop movement of fingers with MP flexion, PIP and DIP extension 7) MP flexion, PIP flexion, DIP extension Performed with therapist demo and min cues for form  Functional component tasks: Manipulation of buttons for 5 reps each with increased time to  complete task, completed regular buttons and metal ones, hand flicks prior to task. Stepping up and down from curb min guard and cues Sit to stand without the use of arms min cues for weight shifting forwards Handwriting with name on lined paper with cues for size of letter formation Car transfer with min guard with cues Scooting to edge of the chair to prepare for sit to stand     PATIENT EDUCATION: Education details: LSVT BIG program, POC, goals, role of OT Person educated: Patient Education method: Explanation Education comprehension: verbalized understanding  HOME EXERCISE PROGRAM: LSVT BIG maximal daily exercises, functional component tasks  GOALS: Goals reviewed with patient? Yes  SHORT TERM GOALS: Target date: 06/08/2022  Pt will demonstrate sit to stand from chair with modified independence, using arm rest as needed to push up from chair.  Baseline: min to mod assist, 10th visit able to complete Goal status: MET   LONG TERM GOALS: Target date: 07/27/2022  Patient will improve gait speed and endurance to be able to ambulate 1000 feet in 6 minutes to negotiate around the home and community safely in 4 weeks.  Baseline: Eval 350 feet , 10th visit: 900 feet, goal revised Goal status: REVISED IN PROGRESS  2.  Patient will complete HEP for maximal daily exercises with modified independence in 4 weeks.  Baseline: Eval-no current program, 10th visit:  min assist, goal revised Goal status: REVISED IN PROGRESS  3.  Patient will transfer from to sit to stand safely and independently from a variety of chairs/surfaces in 4 weeks.  Baseline: unable at eval, 10th visit: working towards lower surface heights Goal status: IN PROGRESS  4.  Pt will complete dressing with min assist  Baseline: mod to max assist at eval , 10th visit: min to mod assist Goal status: IN PROGRESS  5.  Pt will complete bathing with min  assist  Baseline: max assist, 10th visit:  mod assist Goal status:  IN PROGRESS  6.  Pt will complete standing at the sink for up to 10 mins to wash dishes with SBA. Baseline: Difficulty standing to wash dishes, can stand 1-3 mins, 10th visit:  able to stand for up to 5 mins, revise goal to 10 mins. Goal status: REVISED IN PROGRESS  ASSESSMENT:  CLINICAL IMPRESSION: Patient reports she was late to therapy today due to having stomach issues and had to turn around and go back home before proceeding to the clinic. Therapist was unable to accommodate a full session secondary to schedule restrictions with patient arriving late. Patient was able to demonstrate her maximal daily exercises this date with continued verbal cues and occasional therapist demonstration. Patient requesting to extend her therapy to accommodate for missed appointments. She will be starting LSVT on Thursday of this week. She is planning to go for a second opinion about her knee soon in [place].  She has been ambulating to the clinic without an assistive device and doing well.  She has continues to demonstrate progress in all areas, despite being sick over the last couple of weeks continue to work towards goals in plan of care to maximize safety and independence in ADL and ADL tasks at home and in the community.   PERFORMANCE DEFICITS: in functional skills including ADLs, IADLs, coordination, dexterity, ROM, strength, flexibility, Fine motor control, Gross motor control, mobility, balance, endurance, decreased knowledge of use of DME, and UE functional use, cognitive skills including attention and memory, and psychosocial skills including environmental adaptation, habits, and routines and behaviors.   IMPAIRMENTS: are limiting patient from ADLs, IADLs, leisure, and social participation.   COMORBIDITIES:  has co-morbidities such as orthostatic hypotension, CAD, syncope, knee pain, unsteady gait, fall risk, COPD, osteoporosis,   that affects occupational performance. Patient will benefit from skilled  OT to address above impairments and improve overall function.  MODIFICATION OR ASSISTANCE TO COMPLETE EVALUATION: Min-Moderate modification of tasks or assist with assess necessary to complete an evaluation.  OT OCCUPATIONAL PROFILE AND HISTORY: Detailed assessment: Review of records and additional review of physical, cognitive, psychosocial history related to current functional performance.  CLINICAL DECISION MAKING: High - multiple treatment options, significant modification of task necessary  REHAB POTENTIAL: Good  EVALUATION COMPLEXITY: High    PLAN:  OT FREQUENCY: 4x/week  OT DURATION: 4 weeks  PLANNED INTERVENTIONS: self care/ADL training, therapeutic exercise, therapeutic activity, neuromuscular re-education, manual therapy, passive range of motion, gait training, balance training, stair training, and functional mobility training  RECOMMENDED OTHER SERVICES: LSVT LOUD with SLP  CONSULTED AND AGREED WITH PLAN OF CARE: Patient and family member/caregiver  PLAN FOR NEXT SESSION: Will continue with maximal daily exercises, posture exercises, functional component tasks with increasing complexity  Skyy Mcknight T Leightyn Cina, OTR/L, CLT  Mauriah Mcmillen, OT 07/11/2022, 6:56 PM

## 2022-07-11 NOTE — Therapy (Signed)
OUTPATIENT OCCUPATIONAL THERAPY PARKINSON'S TREATMENT  Patient Name: Victoria Hanson MRN: [id number] DOB:[birth year], 72 y.o., female Today's Date: 07/10/2022  PCP: Einar Pheasant REFERRING PROVIDER: Alonza Bogus   END OF SESSION:  OT End of Session - 07/11/22 2008     Visit Number 14    Number of Visits 17    Date for OT Re-Evaluation 07/27/22    OT Start Time 1259    OT Stop Time 1400    OT Time Calculation (min) 61 min    Activity Tolerance Patient tolerated treatment well    Behavior During Therapy [facility] for tasks assessed/performed             Past Medical History:  Diagnosis Date   Anxiety    Asthma    Atypical chest pain    a. 2003 Cath: reportedly nl per pt [name]);  b. 04/2015 [place] Admission, neg troponin.   Colon cancer ([place]) 1999   a. 1999: Pt had partial colectomy. Did develop lung metastasis. Had right upper lobectomy in 01/2007 then had associated chemotherapy.   COPD (chronic obstructive pulmonary disease) (HCC)    DDD (degenerative disc disease), lumbosacral    Depression    Echocardiogram abnormal    a. 02/2010 Echo: EF 55-60%, mild LAE, no regional wall motion abnormalities   Elevated transaminase level    a. ? NASH;  b. 06/2015 - nl LFTs.   GERD (gastroesophageal reflux disease)    Hematuria    Holter monitor, abnormal    a. 12/2009 Holter monitoring: NSR, rare PACs and PVCs.   Hypercholesterolemia    Hyperlipidemia    Hypertension    Leaky heart valve    a. Per pt report - no evidence of valvular abnormalities on prior echoes.   Lesion of skin of Right breast 10/20/2017   Lung metastases 2008   a. S/P resection (lobectomy - right)   Morbid obesity ([place])    Nephrolithiasis    pt denies   Parkinson disease 08/2016   [name]   Past Surgical History:  Procedure Laterality Date   BREAST EXCISIONAL BIOPSY Right 10/07/2009   benign   CARDIAC CATHETERIZATION  2003   negative as per pt report   CHOLECYSTECTOMY  2000   COLON SURGERY      Colon cancer, removed part of large colon   COLONOSCOPY N/A 04/25/2015   Procedure: COLONOSCOPY;  Surgeon: Manya Silvas, MD;  Location: [place];  Service: Endoscopy;  Laterality: N/A;   COLONOSCOPY WITH PROPOFOL N/A 06/14/2017   Procedure: COLONOSCOPY WITH PROPOFOL;  Surgeon: Manya Silvas, MD;  Location[facility] ENDOSCOPY;  Service: Endoscopy;  Laterality: N/A;   COLONOSCOPY WITH PROPOFOL N/A 11/17/2019   Procedure: COLONOSCOPY WITH PROPOFOL;  Surgeon: [name], MD;  Location: [facility] ENDOSCOPY;  Service: Gastroenterology;  Laterality: N/A;   COLONOSCOPY WITH PROPOFOL N/A 03/21/2021   Procedure: COLONOSCOPY WITH PROPOFOL;  Surgeon: Lesly Rubenstein, MD;  Location: [facility] ENDOSCOPY;  Service: Endoscopy;  Laterality: N/A;   ESOPHAGOGASTRODUODENOSCOPY (EGD) WITH PROPOFOL N/A 03/21/2021   Procedure: ESOPHAGOGASTRODUODENOSCOPY (EGD) WITH PROPOFOL;  Surgeon: Lesly Rubenstein, MD;  Location: [facility] ENDOSCOPY;  Service: Endoscopy;  Laterality: N/A;   LOBECTOMY  01/2007   Right lung, upper lobe right side removed   TOTAL ABDOMINAL HYSTERECTOMY     abnormal bleeding   TUBAL LIGATION  1980   Patient Active Problem List   Diagnosis Date Noted   Hypophonia 05/23/2022   Left knee pain 05/02/2022   Parkinson disease 03/12/2022   Change in  bowel movement 02/05/2022   Lower extremity edema 01/30/2022   Cloudy urine 11/29/2021   History of total knee arthroplasty 09/13/2021   Hepatic steatosis 09/13/2021   Urinary frequency 03/26/2021   Right knee pain 01/01/2021   UTI (urinary tract infection) 11/27/2020   Vitamin D deficiency 11/05/2020   Aortic atherosclerosis ([place]) 11/05/2020   Dizziness 08/24/2020   Cardiomegaly 08/24/2020   Constipation 11/11/2019   Anemia 11/11/2019   Near syncope 02/13/2019   Fibromuscular dysplasia ([place]) 02/02/2019   Abdominal bloating 01/04/2019   Unsteady gait 12/03/2018   History of UTI 10/08/2018   Ear fullness, right 10/08/2018   B12 deficiency  03/11/2018   Goals of care, counseling/discussion 10/15/2017   Orthostatic hypotension due to Parkinson's disease ([place]) 07/31/2017   COPD exacerbation ([place]) 07/31/2017   REM behavioral disorder 05/28/2017   Elevated liver function tests 08/25/2016   Carotid artery disease ([place]) 07/19/2016   Tremor 06/01/2016   Osteoporosis 04/08/2016   Foot pain, bilateral 07/24/2015   Head pain 07/24/2015   Hyperlipidemia 06/21/2015   Other fatigue 06/21/2015   BMI 33.0-33.9,adult 06/21/2015   Disorder of kidney and ureter 03/28/2015   Hyperglycemia 03/20/2015   Swelling of lower extremity 11/20/2014   Dysuria 10/24/2014   Health care maintenance 07/17/2014   Hypokalemia 04/05/2013   TIA (transient ischemic attack) 02/09/2013   Transient cerebral ischemia 02/09/2013   Anxiety, mild 03/13/2012   Depression, recurrent ([place]) 03/13/2012   History of colon cancer 03/13/2012   Hypertension 03/13/2012   CKD (chronic kidney disease), stage III ([place]) 03/13/2012   GERD (gastroesophageal reflux disease) 03/13/2012   Acid reflux 03/13/2012   History of malignant neoplasm metastatic to lung 03/13/2012   Major depressive disorder with single episode 03/13/2012   Hypercholesterolemia 02/02/2010   SOB (shortness of breath) on exertion 02/02/2010   Pure hypercholesterolemia 02/02/2010    ONSET DATE: 05/23/2022, diagnosed with Parkinson's in 2018  REFERRING DIAG: Parkinson's disease, LSVT BIG  THERAPY DIAG:  Muscle weakness (generalized)  Unsteadiness on feet  Other lack of coordination  Difficulty in walking, not elsewhere classified  Abnormal posture  Rationale for Evaluation and Treatment: Rehabilitation  SUBJECTIVE:   SUBJECTIVE STATEMENT:  Pt reports she is doing better but still feels really weak after being sick for the last couple weeks.  Stomach is feeling better today. Pt reports she only did exercises one time yesterday which was in the clinic.  She did practice sit to stand.     PERTINENT HISTORY: Pt diagnosed with Parkinson's disease in 2018.  Pt with multiple visits to the ER this year with BP issues, UTI, weakness and changes in medication.  Pt requiring increased assistance from family with all self care and mobility.  She had a knee replacement on the left March 2023.  Tremors in bilateral hands.Pt has a history of hypotension.  PRECAUTIONS: Fall, Pt has a history of Neurogenic Orthostatic Hypotension  WEIGHT BEARING RESTRICTIONS: No  PAIN:  Are you having pain? No  FALLS: Has patient fallen in last 6 months? No  LIVING ENVIRONMENT: Lives with: lives with their family Lives in: House/apartment Stairs: No Has following equipment at home: Gilford Rile - 2 wheeled, shower chair, bed side commode, and Grab bars  PLOF: Needs assistance with ADLs, Needs assistance with homemaking, and Needs assistance with transfers  PATIENT GOALS: Pt reports she would like to be as independent as possible, do more for herself  OBJECTIVE:   HAND DOMINANCE: Right  ADLs: Overall ADLs: Pt reports her daughter helps her  with bathing and dressing Transfers/ambulation related to ADLs: Eating: Able to feed self independently Grooming: Able to complete grooming with modified independence, difficulty with standing for long periods of time UB Dressing: min to mod assist LB Dressing: mod to max assist Toileting: min to mod assist  Bathing: max assist Tub Shower transfers: mod assist, has shower chair and grab bar in shower. Equipment: Shower seat with back, Grab bars, and bed side commode  IADLs: Shopping: Dependent Light housekeeping: Reports she tries to help wash dishes but cannot stand very long Meal Prep: Max to dependent  Community mobility: does not drive, family provides transportation, ambulates with rolling walker  Medication management: TBA Financial management: TBA Handwriting: 25% legible, demonstrates severe micrographia  MOBILITY STATUS: Needs Assist: assist  with sit to stand transfer, ambulates with RW, difficulty with turns, and start hesitation, freezing of gait noted.  POSTURE COMMENTS:  rounded shoulders and forward head  ACTIVITY TOLERANCE: Activity tolerance: Fair, patient requires frequent rest breaks during evaluation and during mobility tasks  FUNCTIONAL OUTCOME MEASURES: FOTO 6 min walk test 350 feet, 10th visit: 900 feet 5 times sit to stand unable to perform sit to stand without use of arms, 10th visit: 15 secs BERG balance: 21/56, 10th visit: 29/56  COORDINATION: Finger Nose Finger test: impaired  9 Hole Peg test: Right: 50 sec; Left: 56 sec  UE/LE ROM:   Pt with limited ROM of bilateral UEs, LE:  right knee extension full, left 30 degrees of knee extension and limited dorsiflexion of knee  UE MMT:    3/5 overall strength in UE, LE:  right 3/5, left 2-/5  MUSCLE TONE: Increased muscle tone L greater than R, UE greater than LE.    SENSATION: WFL  COGNITION: Overall cognitive status: Within functional limits for tasks assessed  OBSERVATIONS: Bradykinesia   TODAY'S TREATMENT:                                                                                                                              DATE: 07/10/2022  Neuromuscular Reeducation:  Patient seen for initial instruction of LSVT BIG exercises: LSVT Daily Session Maximal Daily Exercises: Sustained movements are designed to rescale the amplitude of movement output for generalization to daily functional activities. Performed as follows for 1 set of 10 repetitions each: Multi directional sustained movements- 1) Floor to ceiling, 2) Side to side. Multi directional Repetitive movements performed in standing and are designed to provide retraining effort needed for sustained muscle activation in tasks Performed as follows: 3) Step and reach forward, 4) Step and Reach Backwards, 5) Step and reach sideways, 6) Rock and reach forward/backward, 7) Rock and reach sideways.  Sit to stand from mat table on lowest setting with cues for weight shift, technique and min assist to min guard for 10 reps for 1 set.    All exercises were performed in an ADAPTED version of the LSVT BIG protocol for maximal daily exercises.  The  adapted version utilizes a chair to assist with balance.  Pt requiring min guard for exercises in standing for transitional movements and maintaining balance.  Pt requires verbal cues and tactile cues for proper form and technique.  Posture exercises in sitting and standing at the wall, cues for shoulder retraction and promotion of upright posture, completed 5 reps each.   Functional mobility: Pt seen for functional mobility without the use of an assistive device for one trial of 450 feet.  Continued cues for step height and length for greater amplitude of movement.    Car transfer with min guard and cues managing lower extremities to get into vehicle.  Stepping up and down on curb with min guard and cues for stepping patterns.   7  hand exercises to help promote handwriting, dexterity and flexibility. 1)  Fisting with arm then arm extension with fingers extended with BIG hand movements 2) oppositional movements of the thumb to each digit 3) wrist flex/ext with elbows extended 4) supination/pronation with elbows bent 5) tendon gliding with hook fist and then finger extension 6) tendon gliding with tabletop movement of fingers with MP flexion, PIP and DIP extension 7) MP flexion, PIP flexion, DIP extension Performed with therapist demo and min cues for form  Functional component tasks: Manipulation of buttons for 5 reps each with increased time to complete task, completed regular buttons and metal ones, hand flicks prior to task. Stepping up and down from curb min guard and cues Sit to stand without the use of arms min cues for weight shifting forwards Handwriting with name on lined paper with cues for size of letter formation, added formulation of  a list of 10 items with good legibility. Car transfer with min guard with cues Scooting to edge of the chair to prepare for sit to stand modified independence   PATIENT EDUCATION: Education details: LSVT BIG program, POC, goals, role of OT Person educated: Patient Education method: Explanation Education comprehension: verbalized understanding  HOME EXERCISE PROGRAM: LSVT BIG maximal daily exercises, functional component tasks  GOALS: Goals reviewed with patient? Yes  SHORT TERM GOALS: Target date: 06/08/2022  Pt will demonstrate sit to stand from chair with modified independence, using arm rest as needed to push up from chair.  Baseline: min to mod assist, 10th visit able to complete Goal status: MET   LONG TERM GOALS: Target date: 07/27/2022  Patient will improve gait speed and endurance to be able to ambulate 1000 feet in 6 minutes to negotiate around the home and community safely in 4 weeks.  Baseline: Eval 350 feet , 10th visit: 900 feet, goal revised Goal status: REVISED IN PROGRESS  2.  Patient will complete HEP for maximal daily exercises with modified independence in 4 weeks.  Baseline: Eval-no current program, 10th visit:  min assist, goal revised Goal status: REVISED IN PROGRESS  3.  Patient will transfer from to sit to stand safely and independently from a variety of chairs/surfaces in 4 weeks.  Baseline: unable at eval, 10th visit: working towards lower surface heights Goal status: IN PROGRESS  4.  Pt will complete dressing with min assist  Baseline: mod to max assist at eval , 10th visit: min to mod assist Goal status: IN PROGRESS  5.  Pt will complete bathing with min assist  Baseline: max assist, 10th visit:  mod assist Goal status: IN PROGRESS  6.  Pt will complete standing at the sink for up to 10 mins to wash dishes with SBA. Baseline: Difficulty  standing to wash dishes, can stand 1-3 mins, 10th visit:  able to stand for up to 5 mins, revise goal to 10  mins. Goal status: REVISED IN PROGRESS  ASSESSMENT:  CLINICAL IMPRESSION: Pt has been inconsistent with performance of her home exercises but has still demonstrated good progress in all areas since the start of therapy.  She continues to require cues for proper form and technique of exercises.  We have discussed the importance of completing exercises on a daily basis 2 times a day.  She has progressed to ambulating without an assistive device with good balance over the last week.  Will continue to work towards calibration of movement patterns and increasing amplitude of movement while also working towards improving consistency of performing daily exercises.  Continue towards goals in plan of care.   PERFORMANCE DEFICITS: in functional skills including ADLs, IADLs, coordination, dexterity, ROM, strength, flexibility, Fine motor control, Gross motor control, mobility, balance, endurance, decreased knowledge of use of DME, and UE functional use, cognitive skills including attention and memory, and psychosocial skills including environmental adaptation, habits, and routines and behaviors.   IMPAIRMENTS: are limiting patient from ADLs, IADLs, leisure, and social participation.   COMORBIDITIES:  has co-morbidities such as orthostatic hypotension, CAD, syncope, knee pain, unsteady gait, fall risk, COPD, osteoporosis,   that affects occupational performance. Patient will benefit from skilled OT to address above impairments and improve overall function.  MODIFICATION OR ASSISTANCE TO COMPLETE EVALUATION: Min-Moderate modification of tasks or assist with assess necessary to complete an evaluation.  OT OCCUPATIONAL PROFILE AND HISTORY: Detailed assessment: Review of records and additional review of physical, cognitive, psychosocial history related to current functional performance.  CLINICAL DECISION MAKING: High - multiple treatment options, significant modification of task necessary  REHAB POTENTIAL:  Good  EVALUATION COMPLEXITY: High    PLAN:  OT FREQUENCY: 4x/week  OT DURATION: 4 weeks  PLANNED INTERVENTIONS: self care/ADL training, therapeutic exercise, therapeutic activity, neuromuscular re-education, manual therapy, passive range of motion, gait training, balance training, stair training, and functional mobility training  RECOMMENDED OTHER SERVICES: LSVT LOUD with SLP  CONSULTED AND AGREED WITH PLAN OF CARE: Patient and family member/caregiver  PLAN FOR NEXT SESSION: Will continue with maximal daily exercises, posture exercises, functional component tasks with increasing complexity  Albie Arizpe T Jenan Ellegood, OTR/L, CLT  Jaelynne Hockley, OT 07/11/2022, 8:10 PM

## 2022-07-12 ENCOUNTER — Ambulatory Visit: Payer: 59 | Attending: Neurology | Admitting: Speech Pathology

## 2022-07-12 DIAGNOSIS — R471 Dysarthria and anarthria: Secondary | ICD-10-CM | POA: Diagnosis not present

## 2022-07-12 DIAGNOSIS — G20A1 Parkinson's disease without dyskinesia, without mention of fluctuations: Secondary | ICD-10-CM | POA: Insufficient documentation

## 2022-07-12 NOTE — Addendum Note (Signed)
Addended by: Garlon Hatchet T on: 07/12/2022 09:03 AM   Modules accepted: Orders

## 2022-07-12 NOTE — Therapy (Signed)
OUTPATIENT SPEECH LANGUAGE PATHOLOGY LSVT LOUD EVALUATION   Patient Name: Victoria Hanson MRN: [id number] DOB:[birth year], 72 y.o., female Today's Date: 07/12/2022  PCP: Einar Pheasant, MD REFERRING PROVIDER: Alonza Bogus, DO   End of Session - 07/12/22 1009     Visit Number 1    Number of Visits 17    Date for SLP Re-Evaluation 09/10/22    SLP Start Time 1000    SLP Stop Time  1100    SLP Time Calculation (min) 60 min    Activity Tolerance Patient tolerated treatment well             Past Medical History:  Diagnosis Date   Anxiety    Asthma    Atypical chest pain    a. 2003 Cath: reportedly nl per pt [name]);  b. 04/2015 [place] Admission, neg troponin.   Colon cancer ([place]) 1999   a. 1999: Pt had partial colectomy. Did develop lung metastasis. Had right upper lobectomy in 01/2007 then had associated chemotherapy.   COPD (chronic obstructive pulmonary disease) (HCC)    DDD (degenerative disc disease), lumbosacral    Depression    Echocardiogram abnormal    a. 02/2010 Echo: EF 55-60%, mild LAE, no regional wall motion abnormalities   Elevated transaminase level    a. ? NASH;  b. 06/2015 - nl LFTs.   GERD (gastroesophageal reflux disease)    Hematuria    Holter monitor, abnormal    a. 12/2009 Holter monitoring: NSR, rare PACs and PVCs.   Hypercholesterolemia    Hyperlipidemia    Hypertension    Leaky heart valve    a. Per pt report - no evidence of valvular abnormalities on prior echoes.   Lesion of skin of Right breast 10/20/2017   Lung metastases 2008   a. S/P resection (lobectomy - right)   Morbid obesity ([place])    Nephrolithiasis    pt denies   Parkinson disease 08/2016   [name]   Past Surgical History:  Procedure Laterality Date   BREAST EXCISIONAL BIOPSY Right 10/07/2009   benign   CARDIAC CATHETERIZATION  2003   negative as per pt report   CHOLECYSTECTOMY  2000   COLON SURGERY     Colon cancer, removed part of large colon   COLONOSCOPY N/A 04/25/2015    Procedure: COLONOSCOPY;  Surgeon: Manya Silvas, MD;  Location: [facility];  Service: Endoscopy;  Laterality: N/A;   COLONOSCOPY WITH PROPOFOL N/A 06/14/2017   Procedure: COLONOSCOPY WITH PROPOFOL;  Surgeon: Manya Silvas, MD;  Location: [facility] ENDOSCOPY;  Service: Endoscopy;  Laterality: N/A;   COLONOSCOPY WITH PROPOFOL N/A 11/17/2019   Procedure: COLONOSCOPY WITH PROPOFOL;  Surgeon: [name], MD;  Location: [facility] ENDOSCOPY;  Service: Gastroenterology;  Laterality: N/A;   COLONOSCOPY WITH PROPOFOL N/A 03/21/2021   Procedure: COLONOSCOPY WITH PROPOFOL;  Surgeon: Lesly Rubenstein, MD;  Location: [facility] ENDOSCOPY;  Service: Endoscopy;  Laterality: N/A;   ESOPHAGOGASTRODUODENOSCOPY (EGD) WITH PROPOFOL N/A 03/21/2021   Procedure: ESOPHAGOGASTRODUODENOSCOPY (EGD) WITH PROPOFOL;  Surgeon: Lesly Rubenstein, MD;  Location: [facility] ENDOSCOPY;  Service: Endoscopy;  Laterality: N/A;   LOBECTOMY  01/2007   Right lung, upper lobe right side removed   TOTAL ABDOMINAL HYSTERECTOMY     abnormal bleeding   TUBAL LIGATION  1980   Patient Active Problem List   Diagnosis Date Noted   Hypophonia 05/23/2022   Left knee pain 05/02/2022   Parkinson disease 03/12/2022   Change in bowel movement 02/05/2022   Lower extremity edema 01/30/2022  Cloudy urine 11/29/2021   History of total knee arthroplasty 09/13/2021   Hepatic steatosis 09/13/2021   Urinary frequency 03/26/2021   Right knee pain 01/01/2021   UTI (urinary tract infection) 11/27/2020   Vitamin D deficiency 11/05/2020   Aortic atherosclerosis ([place]) 11/05/2020   Dizziness 08/24/2020   Cardiomegaly 08/24/2020   Constipation 11/11/2019   Anemia 11/11/2019   Near syncope 02/13/2019   Fibromuscular dysplasia ([place]) 02/02/2019   Abdominal bloating 01/04/2019   Unsteady gait 12/03/2018   History of UTI 10/08/2018   Ear fullness, right 10/08/2018   B12 deficiency 03/11/2018   Goals of care, counseling/discussion 10/15/2017   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 ([place]) 07/31/2017   COPD exacerbation ([place]) 07/31/2017   REM behavioral disorder 05/28/2017   Elevated liver function tests 08/25/2016   Carotid artery disease ([place]) 07/19/2016   Tremor 06/01/2016   Osteoporosis 04/08/2016   Foot pain, bilateral 07/24/2015   Head pain 07/24/2015   Hyperlipidemia 06/21/2015   Other fatigue 06/21/2015   BMI 33.0-33.9,adult 06/21/2015   Disorder of kidney and ureter 03/28/2015   Hyperglycemia 03/20/2015   Swelling of lower extremity 11/20/2014   Dysuria 10/24/2014   Health care maintenance 07/17/2014   Hypokalemia 04/05/2013   TIA (transient ischemic attack) 02/09/2013   Transient cerebral ischemia 02/09/2013   Anxiety, mild 03/13/2012   Depression, recurrent ([place]) 03/13/2012   History of colon cancer 03/13/2012   Hypertension 03/13/2012   CKD (chronic kidney disease), stage III ([place]) 03/13/2012   GERD (gastroesophageal reflux disease) 03/13/2012   Acid reflux 03/13/2012   History of malignant neoplasm metastatic to lung 03/13/2012   Major depressive disorder with single episode 03/13/2012   Hypercholesterolemia 02/02/2010   SOB (shortness of breath) on exertion 02/02/2010   Pure hypercholesterolemia 02/02/2010    Onset date: referral date 05/23/22, PD dx 2018  REFERRING DIAG: Parkinson's disease, hypophonia  THERAPY DIAG:  Dysarthria and anarthria  Parkinson's disease, unspecified whether dyskinesia present, unspecified whether manifestations fluctuate  Rationale for Evaluation and Treatment Rehabilitation  SUBJECTIVE:   SUBJECTIVE STATEMENT: Pt reports she has noted voice changes since having Covid recently.  Pt accompanied by: self  PERTINENT HISTORY: Patient is a 72 y.o. female with past medical history inlcuidng Parkinson's disease (dx 07/2016), asthma, colon cancer, lung metastases s/p right lobectomy, COPD, and HTN, referred by Dr. Wells Guiles Tat for LSVT-LOUD for hypophonia secondary to  Parkinson's. Pt recently attended most of course of LSVT-BIG with OT.   DIAGNOSTIC FINDINGS: MRI Brain 11/05/2021: Age normal brain MRI.   PAIN:  Are you having pain? No   FALLS: Has patient fallen in last 6 months? No  LIVING ENVIRONMENT: Lives with: lives with their son and lives with their daughter Lives in: House/apartment  PLOF: Independent with basic ADLs  PATIENT GOALS Communicate more clearly, improve voice  OBJECTIVE:   COGNITION: Overall cognitive status: Impaired Areas of impairment: n/a Functional Deficits: pt reports short-term memory deficits  SOCIAL HISTORY: Occupation: retired Building control surveyor intake: suboptimal and 16oz Caffeine/alcohol intake:  20 oz Daily voice use: moderate  ORAL MOTOR EXAMINATION Facial : WFL Lingual: Comment: Reduced ROM L and R Velum: WFL Mandible: WFL Cough: WFL Voice: Weak, Other: low intensity, hoarse    Patient does not report difficulty swallowing which does not warrant further evaluation  PERCEPTUAL VOICE ASSESSMENT: Voice quality: hoarse and low vocal intensity Vocal abuse:  none noted Resonance: normal Respiratory function:  shallow breathing   LSVT-LOUD VOICE EVALUATION: Maximum phonation time for sustained "ah": 9.7 seconds Mean intensity  during sustained "ah": 60 dB  Mean fundamental frequency during sustained "ah": 140 Hz (3.9 STD below average of 244 Hz +/- 53for age and gender) Mean intensity sustained during conversational speech: 59 dB Pitch range during conversational speech: 68-194 Hz Habitual pitch: 175 Hz Mean intensity during phrase repetition: 62 dB Mean intensity during paragraph reading: 59 dB Pitch range during paragraph reading: 72-215 Hz Highest dynamic pitch when altering pitch from a low note to a high note: 210 Hz Lowest dynamic pitch when altering from a high note to a low note: 73 Hz (hoarse, gravelly)  Speech is characterized by Imprecise consonants, Fast rate, Monoloudness, Monopitch,  Hypophonia, and hoarse, gravelly vocal quality. For trained listener in quiet environment with context, intelligibility was 85%.   Patient able to improve all parameters with model ("Loud like me") Stimulability: Improved vocal quality and pitch with loud voice (79 dB, duration 19.3 seconds, pitch avg 258 Hz) for sustained vowel, phrase repetition improve to 72 dB, and conversation sample (60 seconds) increased to 67 dB with mod cues.     PATIENT REPORTED OUTCOME  The Communication Effectiveness Survey is a patient-reported outcome measure in which the patient rates their own effectiveness in different communication situations. A higher score indicates greater effectiveness.   Pt's self-rating was 21/32. Patient reported more difficulty communicating in noisy environments and at a distance.    PATIENT EDUCATION: Education details: Course of therapy for LSVT-LOUD Person educated: Patient Education method: Explanation Education comprehension: verbalized understanding   HOME EXERCISE PROGRAM: To be provided during first treatment session     GOALS: Goals reviewed with patient? Yes  SHORT TERM GOALS: Target date: 10 sessions  The patient will complete Daily Tasks (Maximum duration "ah", High/Lows, and Functional Phrases) at average loudness >/= 85 dB and with loud, good quality voice with min cues. Baseline: Goal status: INITIAL  2.  The patient will complete Hierarchal Speech Loudness reading drills (words/phrases, sentences) at average >/= 75 dB and with loud, good quality voice with min cues. Baseline:  Goal status: INITIAL  3.  The patient will participate in 5-8 minutes conversation, maintaining average loudness of 75 dB and loud, good quality voice with min cues Baseline:  Goal status: INITIAL    LONG TERM GOALS: Target date: 09/10/2022  The patient will complete Daily Tasks (Maximum duration "ah", High/Lows, and Functional Phrases) at average loudness >/= 82 dB and  with loud, good quality voice Baseline:  Goal status: INITIAL  2.  The patient will complete Hierarchal Speech Loudness reading drills (words/phrases, sentences, and paragraph) at average >/= 72 dB and with loud, good quality voice. Baseline:  Goal status: INITIAL  3.  The patient will participate in 10 minutes conversation, maintaining average loudness of >/= 72 dB and loud, good quality voice. Baseline:  Goal status: INITIAL  4.  Patient will report improved communication effectiveness as measured by Communicative Effectiveness Survey.  Baseline:  Goal status: INITIAL    ASSESSMENT:  CLINICAL IMPRESSION: Patient presents with moderate hypokinetic dysarthria characterized by low vocal intensity which impacts intelligibility at the sentence level. Hypophonia has greatest impact on intelligibility, which is judged to be 85% in a quiet environment. Imprecise articulation and hoarse, gravely vocal quality are also noted. With model and cues for "loud like me," patient increased intensity and improved vocal quality and pitch variability. Patient initially unaware of sub-optimal intensity, however with feedback and comparison provided between initial testing and stimulability testing, pt immediately made attempts to correct her intensity in spontaneous  conversation. Patient appears to be an excellent candidate for LSVT-LOUD and is highly motivated for improvement. I recommend skilled ST for course of LSVT to improve vocal quality and loudness for improved communication with family and friends.    OBJECTIVE IMPAIRMENTS include dysarthria and voice disorder. These impairments are limiting patient from effectively communicating at home and in community. Factors affecting potential to achieve goals and functional outcome are  + highly stimulable with model . Patient will benefit from skilled SLP services to address above impairments and improve overall function.  REHAB POTENTIAL:  Excellent  PLAN: SLP FREQUENCY: 4x/week  SLP DURATION: 4 weeks  PLANNED INTERVENTIONS: Environmental controls, Cueing hierachy, Functional tasks, SLP instruction and feedback, Patient/family education, and LSVT-LOUD    Deneise Lever, MS, Actor [phone]

## 2022-07-16 ENCOUNTER — Ambulatory Visit: Payer: 59 | Admitting: Speech Pathology

## 2022-07-17 ENCOUNTER — Ambulatory Visit: Payer: 59 | Admitting: Speech Pathology

## 2022-07-17 DIAGNOSIS — R471 Dysarthria and anarthria: Secondary | ICD-10-CM

## 2022-07-17 DIAGNOSIS — G20A1 Parkinson's disease without dyskinesia, without mention of fluctuations: Secondary | ICD-10-CM

## 2022-07-17 NOTE — Patient Instructions (Signed)
Task 3: Functional phrases  Have you took your medicine?  2. Do the dishes!  3. Time to go!  4. Did you feed your animal?  5. Do you want hot dogs?  6. Do you want pizza?  7. Drink water!  8. Did you feed Stephanie Coup?  9. Take me to store.   10. Do you want to watch Jeopardy.

## 2022-07-17 NOTE — Therapy (Signed)
OUTPATIENT SPEECH LANGUAGE PATHOLOGY LSVT LOUD TREATMENT   Patient Name: Victoria Hanson MRN: [id number] DOB:1951/02/15, 72 y.o., female Today's Date: 07/17/2022  PCP: Einar Pheasant, MD REFERRING PROVIDER: Alonza Bogus, DO   End of Session - 07/17/22 1710     Visit Number 2    Number of Visits 17    Date for SLP Re-Evaluation 09/10/22    SLP Start Time 1100    SLP Stop Time  1200    SLP Time Calculation (min) 60 min    Activity Tolerance Patient tolerated treatment well             Past Medical History:  Diagnosis Date   Anxiety    Asthma    Atypical chest pain    a. 2003 Cath: reportedly nl per pt [name]);  b. 04/2015 [place] Admission, neg troponin.   Colon cancer ([place]) 1999   a. 1999: Pt had partial colectomy. Did develop lung metastasis. Had right upper lobectomy in 01/2007 then had associated chemotherapy.   COPD (chronic obstructive pulmonary disease) (HCC)    DDD (degenerative disc disease), lumbosacral    Depression    Echocardiogram abnormal    a. 02/2010 Echo: EF 55-60%, mild LAE, no regional wall motion abnormalities   Elevated transaminase level    a. ? NASH;  b. 06/2015 - nl LFTs.   GERD (gastroesophageal reflux disease)    Hematuria    Holter monitor, abnormal    a. 12/2009 Holter monitoring: NSR, rare PACs and PVCs.   Hypercholesterolemia    Hyperlipidemia    Hypertension    Leaky heart valve    a. Per pt report - no evidence of valvular abnormalities on prior echoes.   Lesion of skin of Right breast 10/20/2017   Lung metastases 2008   a. S/P resection (lobectomy - right)   Morbid obesity ([place])    Nephrolithiasis    pt denies   Parkinson disease 08/2016   [name]   Past Surgical History:  Procedure Laterality Date   BREAST EXCISIONAL BIOPSY Right 10/07/2009   benign   CARDIAC CATHETERIZATION  2003   negative as per pt report   CHOLECYSTECTOMY  2000   COLON SURGERY     Colon cancer, removed part of large colon   COLONOSCOPY N/A 04/25/2015    Procedure: COLONOSCOPY;  Surgeon: Manya Silvas, MD;  Location: [place];  Service: Endoscopy;  Laterality: N/A;   COLONOSCOPY WITH PROPOFOL N/A 06/14/2017   Procedure: COLONOSCOPY WITH PROPOFOL;  Surgeon: Manya Silvas, MD;  Location: [facility] ENDOSCOPY;  Service: Endoscopy;  Laterality: N/A;   COLONOSCOPY WITH PROPOFOL N/A 11/17/2019   Procedure: COLONOSCOPY WITH PROPOFOL;  Surgeon: [name], MD;  Location: [facility] ENDOSCOPY;  Service: Gastroenterology;  Laterality: N/A;   COLONOSCOPY WITH PROPOFOL N/A 03/21/2021   Procedure: COLONOSCOPY WITH PROPOFOL;  Surgeon: Lesly Rubenstein, MD;  Location: [facility] ENDOSCOPY;  Service: Endoscopy;  Laterality: N/A;   ESOPHAGOGASTRODUODENOSCOPY (EGD) WITH PROPOFOL N/A 03/21/2021   Procedure: ESOPHAGOGASTRODUODENOSCOPY (EGD) WITH PROPOFOL;  Surgeon: Lesly Rubenstein, MD;  Location: [facility] ENDOSCOPY;  Service: Endoscopy;  Laterality: N/A;   LOBECTOMY  01/2007   Right lung, upper lobe right side removed   TOTAL ABDOMINAL HYSTERECTOMY     abnormal bleeding   TUBAL LIGATION  1980   Patient Active Problem List   Diagnosis Date Noted   Hypophonia 05/23/2022   Left knee pain 05/02/2022   Parkinson disease 03/12/2022   Change in bowel movement 02/05/2022   Lower extremity edema 01/30/2022  Cloudy urine 11/29/2021   History of total knee arthroplasty 09/13/2021   Hepatic steatosis 09/13/2021   Urinary frequency 03/26/2021   Right knee pain 01/01/2021   UTI (urinary tract infection) 11/27/2020   Vitamin D deficiency 11/05/2020   Aortic atherosclerosis ([place]) 11/05/2020   Dizziness 08/24/2020   Cardiomegaly 08/24/2020   Constipation 11/11/2019   Anemia 11/11/2019   Near syncope 02/13/2019   Fibromuscular dysplasia ([place]) 02/02/2019   Abdominal bloating 01/04/2019   Unsteady gait 12/03/2018   History of UTI 10/08/2018   Ear fullness, right 10/08/2018   B12 deficiency 03/11/2018   Goals of care, counseling/discussion 10/15/2017   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 ([place]) 07/31/2017   COPD exacerbation ([place]) 07/31/2017   REM behavioral disorder 05/28/2017   Elevated liver function tests 08/25/2016   Carotid artery disease ([place]) 07/19/2016   Tremor 06/01/2016   Osteoporosis 04/08/2016   Foot pain, bilateral 07/24/2015   Head pain 07/24/2015   Hyperlipidemia 06/21/2015   Other fatigue 06/21/2015   BMI 33.0-33.9,adult 06/21/2015   Disorder of kidney and ureter 03/28/2015   Hyperglycemia 03/20/2015   Swelling of lower extremity 11/20/2014   Dysuria 10/24/2014   Health care maintenance 07/17/2014   Hypokalemia 04/05/2013   TIA (transient ischemic attack) 02/09/2013   Transient cerebral ischemia 02/09/2013   Anxiety, mild 03/13/2012   Depression, recurrent ([place]) 03/13/2012   History of colon cancer 03/13/2012   Hypertension 03/13/2012   CKD (chronic kidney disease), stage III ([place]) 03/13/2012   GERD (gastroesophageal reflux disease) 03/13/2012   Acid reflux 03/13/2012   History of malignant neoplasm metastatic to lung 03/13/2012   Major depressive disorder with single episode 03/13/2012   Hypercholesterolemia 02/02/2010   SOB (shortness of breath) on exertion 02/02/2010   Pure hypercholesterolemia 02/02/2010    Onset date: referral date 05/23/22, PD dx 2018  REFERRING DIAG: Parkinson's disease, hypophonia  THERAPY DIAG:  Dysarthria and anarthria  Parkinson's disease, unspecified whether dyskinesia present, unspecified whether manifestations fluctuate  Rationale for Evaluation and Treatment Rehabilitation  SUBJECTIVE:   SUBJECTIVE STATEMENT: Pt's initial greeting to SLP was unintelligible Pt accompanied by: self  PERTINENT HISTORY: Patient is a 72 y.o. female with past medical history inlcuidng Parkinson's disease (dx 07/2016), asthma, colon cancer, lung metastases s/p right lobectomy, COPD, and HTN, referred by Dr. Wells Guiles Tat for LSVT-LOUD for hypophonia secondary to Parkinson's. Pt recently  attended most of course of LSVT-BIG with OT.   DIAGNOSTIC FINDINGS: MRI Brain 11/05/2021: Age normal brain MRI.   PAIN:  Are you having pain? No   PATIENT GOALS Communicate more clearly, improve voice  OBJECTIVE:   TODAY'S TREATMENT: LSVT Daily tasks with usual mod cues. Loud /a/ averaged 79 dB, duration 16.1 seconds.  Max F0 for pitch glides was 288 Hz (78 dB avg), min F0 for low glides was 162 Hz (80 dB avg). Functional phrases averaged 67 dB with usual mod cues. Hierarchical loudness drills at word level averaged 66 dB, with mod cues for loudness.     PATIENT EDUCATION: Education details: Course of therapy for LSVT-LOUD Person educated: Patient Education method: Explanation Education comprehension: verbalized understanding   HOME EXERCISE PROGRAM: LSVT Daily tasks reviewed and demonstrated and handouts provided     GOALS: Goals reviewed with patient? Yes  SHORT TERM GOALS: Target date: 10 sessions  The patient will complete Daily Tasks (Maximum duration "ah", High/Lows, and Functional Phrases) at average loudness >/= 85 dB and with loud, good quality voice with min cues. Baseline: Goal status: INITIAL  2.  The patient will complete Hierarchal Speech Loudness reading drills (words/phrases, sentences) at average >/= 75 dB and with loud, good quality voice with min cues. Baseline:  Goal status: INITIAL  3.  The patient will participate in 5-8 minutes conversation, maintaining average loudness of 75 dB and loud, good quality voice with min cues Baseline:  Goal status: INITIAL    LONG TERM GOALS: Target date: 09/10/2022  The patient will complete Daily Tasks (Maximum duration "ah", High/Lows, and Functional Phrases) at average loudness >/= 82 dB and with loud, good quality voice Baseline:  Goal status: INITIAL  2.  The patient will complete Hierarchal Speech Loudness reading drills (words/phrases, sentences, and paragraph) at average >/= 72 dB and with loud, good  quality voice. Baseline:  Goal status: INITIAL  3.  The patient will participate in 10 minutes conversation, maintaining average loudness of >/= 72 dB and loud, good quality voice. Baseline:  Goal status: INITIAL  4.  Patient will report improved communication effectiveness as measured by Communicative Effectiveness Survey.  Baseline:  Goal status: INITIAL    ASSESSMENT:  CLINICAL IMPRESSION: Patient presents with moderate hypokinetic dysarthria characterized by low vocal intensity which impacts intelligibility at the sentence level. Hypophonia has greatest impact on intelligibility, which is judged to be 85% in a quiet environment. Imprecise articulation and hoarse, gravely vocal quality are also noted. With model and cues for "loud like me," patient increases intensity and improved vocal quality and pitch variability; she requires usual moderate cues for this in LSVT daily tasks. Based on response to teaching of daily tasks today, suspect pt will require additional education/reinforcement for correct completion at home. Patient appears to be an excellent candidate for LSVT-LOUD and is highly motivated for improvement. I recommend skilled ST for course of LSVT to improve vocal quality and loudness for improved communication with family and friends.    OBJECTIVE IMPAIRMENTS include dysarthria and voice disorder. These impairments are limiting patient from effectively communicating at home and in community. Factors affecting potential to achieve goals and functional outcome are  + highly stimulable with model . Patient will benefit from skilled SLP services to address above impairments and improve overall function.  REHAB POTENTIAL: Excellent  PLAN: SLP FREQUENCY: 4x/week  SLP DURATION: 4 weeks  PLANNED INTERVENTIONS: Environmental controls, Cueing hierachy, Functional tasks, SLP instruction and feedback, Patient/family education, and LSVT-LOUD    Deneise Lever, MS,  Actor [phone]

## 2022-07-18 ENCOUNTER — Ambulatory Visit: Payer: 59 | Admitting: Speech Pathology

## 2022-07-18 DIAGNOSIS — R471 Dysarthria and anarthria: Secondary | ICD-10-CM | POA: Diagnosis not present

## 2022-07-18 DIAGNOSIS — G20A1 Parkinson's disease without dyskinesia, without mention of fluctuations: Secondary | ICD-10-CM | POA: Diagnosis not present

## 2022-07-18 NOTE — Patient Instructions (Signed)
Speech Therapy Homework: Practice all exercises ONE time on the days you come to therapy. Practice TWO times on the days you do NOT have therapy.  1) Say "ah" with your loud, good quality voice- 6 times  2) Try to reach your HIGHEST pitch while you say "ahhhh" -  6 times  3) Try to reach your LOWEST pitch while you say "ahhhh" -  6 times (sounds like a foghorn!)  4) Read your Functional Phrases LOUD- 1 time  5) Read out LOUD for 10-15 minutes (handouts)

## 2022-07-18 NOTE — Therapy (Signed)
OUTPATIENT SPEECH LANGUAGE PATHOLOGY LSVT LOUD TREATMENT   Patient Name: Victoria Hanson MRN: [id number] DOB:1950/08/23, 72 y.o., female Today's Date: 07/18/2022  PCP: Einar Pheasant, MD REFERRING PROVIDER: Alonza Bogus, DO   End of Session - 07/18/22 1041     Visit Number 3    Number of Visits 17    Date for SLP Re-Evaluation 09/10/22    SLP Start Time 1000    SLP Stop Time  1100    SLP Time Calculation (min) 60 min    Activity Tolerance Patient tolerated treatment well             Past Medical History:  Diagnosis Date   Anxiety    Asthma    Atypical chest pain    a. 2003 Cath: reportedly nl per pt [name]);  b. 04/2015 [place] Admission, neg troponin.   Colon cancer ([place]) 1999   a. 1999: Pt had partial colectomy. Did develop lung metastasis. Had right upper lobectomy in 01/2007 then had associated chemotherapy.   COPD (chronic obstructive pulmonary disease) (HCC)    DDD (degenerative disc disease), lumbosacral    Depression    Echocardiogram abnormal    a. 02/2010 Echo: EF 55-60%, mild LAE, no regional wall motion abnormalities   Elevated transaminase level    a. ? NASH;  b. 06/2015 - nl LFTs.   GERD (gastroesophageal reflux disease)    Hematuria    Holter monitor, abnormal    a. 12/2009 Holter monitoring: NSR, rare PACs and PVCs.   Hypercholesterolemia    Hyperlipidemia    Hypertension    Leaky heart valve    a. Per pt report - no evidence of valvular abnormalities on prior echoes.   Lesion of skin of Right breast 10/20/2017   Lung metastases 2008   a. S/P resection (lobectomy - right)   Morbid obesity ([place])    Nephrolithiasis    pt denies   Parkinson disease 08/2016   [name]   Past Surgical History:  Procedure Laterality Date   BREAST EXCISIONAL BIOPSY Right 10/07/2009   benign   CARDIAC CATHETERIZATION  2003   negative as per pt report   CHOLECYSTECTOMY  2000   COLON SURGERY     Colon cancer, removed part of large colon   COLONOSCOPY N/A 04/25/2015    Procedure: COLONOSCOPY;  Surgeon: Manya Silvas, MD;  Location: [facility];  Service: Endoscopy;  Laterality: N/A;   COLONOSCOPY WITH PROPOFOL N/A 06/14/2017   Procedure: COLONOSCOPY WITH PROPOFOL;  Surgeon: Manya Silvas, MD;  Location: [facility] ENDOSCOPY;  Service: Endoscopy;  Laterality: N/A;   COLONOSCOPY WITH PROPOFOL N/A 11/17/2019   Procedure: COLONOSCOPY WITH PROPOFOL;  Surgeon: [name], MD;  Location: [facility] ENDOSCOPY;  Service: Gastroenterology;  Laterality: N/A;   COLONOSCOPY WITH PROPOFOL N/A 03/21/2021   Procedure: COLONOSCOPY WITH PROPOFOL;  Surgeon: Lesly Rubenstein, MD;  Location: [facility] ENDOSCOPY;  Service: Endoscopy;  Laterality: N/A;   ESOPHAGOGASTRODUODENOSCOPY (EGD) WITH PROPOFOL N/A 03/21/2021   Procedure: ESOPHAGOGASTRODUODENOSCOPY (EGD) WITH PROPOFOL;  Surgeon: Lesly Rubenstein, MD;  Location: [facility] ENDOSCOPY;  Service: Endoscopy;  Laterality: N/A;   LOBECTOMY  01/2007   Right lung, upper lobe right side removed   TOTAL ABDOMINAL HYSTERECTOMY     abnormal bleeding   TUBAL LIGATION  1980   Patient Active Problem List   Diagnosis Date Noted   Hypophonia 05/23/2022   Left knee pain 05/02/2022   Parkinson disease 03/12/2022   Change in bowel movement 02/05/2022   Lower extremity edema 01/30/2022  Cloudy urine 11/29/2021   History of total knee arthroplasty 09/13/2021   Hepatic steatosis 09/13/2021   Urinary frequency 03/26/2021   Right knee pain 01/01/2021   UTI (urinary tract infection) 11/27/2020   Vitamin D deficiency 11/05/2020   Aortic atherosclerosis ([place]) 11/05/2020   Dizziness 08/24/2020   Cardiomegaly 08/24/2020   Constipation 11/11/2019   Anemia 11/11/2019   Near syncope 02/13/2019   Fibromuscular dysplasia ([place]) 02/02/2019   Abdominal bloating 01/04/2019   Unsteady gait 12/03/2018   History of UTI 10/08/2018   Ear fullness, right 10/08/2018   B12 deficiency 03/11/2018   Goals of care, counseling/discussion 10/15/2017   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 ([place]) 07/31/2017   COPD exacerbation ([place]) 07/31/2017   REM behavioral disorder 05/28/2017   Elevated liver function tests 08/25/2016   Carotid artery disease ([place]) 07/19/2016   Tremor 06/01/2016   Osteoporosis 04/08/2016   Foot pain, bilateral 07/24/2015   Head pain 07/24/2015   Hyperlipidemia 06/21/2015   Other fatigue 06/21/2015   BMI 33.0-33.9,adult 06/21/2015   Disorder of kidney and ureter 03/28/2015   Hyperglycemia 03/20/2015   Swelling of lower extremity 11/20/2014   Dysuria 10/24/2014   Health care maintenance 07/17/2014   Hypokalemia 04/05/2013   TIA (transient ischemic attack) 02/09/2013   Transient cerebral ischemia 02/09/2013   Anxiety, mild 03/13/2012   Depression, recurrent ([place]) 03/13/2012   History of colon cancer 03/13/2012   Hypertension 03/13/2012   CKD (chronic kidney disease), stage III ([place]) 03/13/2012   GERD (gastroesophageal reflux disease) 03/13/2012   Acid reflux 03/13/2012   History of malignant neoplasm metastatic to lung 03/13/2012   Major depressive disorder with single episode 03/13/2012   Hypercholesterolemia 02/02/2010   SOB (shortness of breath) on exertion 02/02/2010   Pure hypercholesterolemia 02/02/2010    Onset date: referral date 05/23/22, PD dx 2018  REFERRING DIAG: Parkinson's disease, hypophonia  THERAPY DIAG:  Dysarthria and anarthria  Parkinson's disease, unspecified whether dyskinesia present, unspecified whether manifestations fluctuate  Rationale for Evaluation and Treatment Rehabilitation  SUBJECTIVE:   SUBJECTIVE STATEMENT: "People can't hear me when I talk." Pt accompanied by: self  PERTINENT HISTORY: Patient is a 72 y.o. femalee with past medical history inlcuidng Parkinson's disease (dx 07/2016), asthma, colon cancer, lung metastases s/p right lobectomy, COPD, and HTN, referred by Dr. Wells Guiles Tat for LSVT-LOUD for hypophonia secondary to Parkinson's. Pt recently attended most  of course of LSVT-BIG with OT.   DIAGNOSTIC FINDINGS: MRI Brain 11/05/2021: Age normal brain MRI.   PAIN:  Are you having pain? No   PATIENT GOALS Communicate more clearly, improve voice  OBJECTIVE:   TODAY'S TREATMENT: LSVT Daily tasks with min-mod cues. Loud /a/ averaged 60dB, duration 15.9 seconds, 264 Hz; able to reduce cuing to min verbal cues for final 7 productions.  Max F0 for pitch glides was 319 Hz (81 dB avg), min F0 for low glides was 144 Hz (80 dB avg). Functional phrases averaged 71 dB with occasional min-mod cues. Hierarchical loudness drills at word level averaged 69 dB, with occasional min-mod cues for loudness. Provided updated (simplified instructions) home exercise worksheet as pt had difficulty comprehending LSVT Homework handout. Sent note home with pt to ask daughter to attend at least 1 session to learn how to assist pt with home exercises.   PATIENT EDUCATION: Education details: Course of therapy for LSVT-LOUD Person educated: Patient Education method: Explanation Education comprehension: verbalized understanding   HOME EXERCISE PROGRAM: LSVT Daily tasks reviewed and demonstrated and handouts provided  GOALS: Goals reviewed with patient? Yes  SHORT TERM GOALS: Target date: 10 sessions  The patient will complete Daily Tasks (Maximum duration "ah", High/Lows, and Functional Phrases) at average loudness >/= 85 dB and with loud, good quality voice with min cues. Baseline: Goal status: INITIAL  2.  The patient will complete Hierarchal Speech Loudness reading drills (words/phrases, sentences) at average >/= 75 dB and with loud, good quality voice with min cues. Baseline:  Goal status: INITIAL  3.  The patient will participate in 5-8 minutes conversation, maintaining average loudness of 75 dB and loud, good quality voice with min cues Baseline:  Goal status: INITIAL    LONG TERM GOALS: Target date: 09/10/2022  The patient will complete Daily Tasks  (Maximum duration "ah", High/Lows, and Functional Phrases) at average loudness >/= 82 dB and with loud, good quality voice Baseline:  Goal status: INITIAL  2.  The patient will complete Hierarchal Speech Loudness reading drills (words/phrases, sentences, and paragraph) at average >/= 72 dB and with loud, good quality voice. Baseline:  Goal status: INITIAL  3.  The patient will participate in 10 minutes conversation, maintaining average loudness of >/= 72 dB and loud, good quality voice. Baseline:  Goal status: INITIAL  4.  Patient will report improved communication effectiveness as measured by Communicative Effectiveness Survey.  Baseline:  Goal status: INITIAL    ASSESSMENT:  CLINICAL IMPRESSION: Patient presents with moderate hypokinetic dysarthria characterized by low vocal intensity which impacts intelligibility at the sentence level. Hypophonia has greatest impact on intelligibility, which is judged to be 85% in a quiet environment. Imprecise articulation and hoarse, gravely vocal quality are also noted. With model and cues for "loud like me," patient increases intensity and improved vocal quality and pitch variability; she requires usual moderate cues for this in LSVT daily tasks. Based on response to teaching of daily tasks, and pt's report of difficulty remembering how to complete home takss, suspect pt will require additional education/reinforcement for correct completion at home. Requested daughter attend to learn how to assist pt. Patient appears to be an excellent candidate for LSVT-LOUD and is highly motivated for improvement. I recommend skilled ST for course of LSVT to improve vocal quality and loudness for improved communication with family and friends.    OBJECTIVE IMPAIRMENTS include dysarthria and voice disorder. These impairments are limiting patient from effectively communicating at home and in community. Factors affecting potential to achieve goals and functional outcome  are  + highly stimulable with model . Patient will benefit from skilled SLP services to address above impairments and improve overall function.  REHAB POTENTIAL: Excellent  PLAN: SLP FREQUENCY: 4x/week  SLP DURATION: 4 weeks  PLANNED INTERVENTIONS: Environmental controls, Cueing hierachy, Functional tasks, SLP instruction and feedback, Patient/family education, and LSVT-LOUD    Deneise Lever, MS, Actor [phone]

## 2022-07-19 ENCOUNTER — Ambulatory Visit: Payer: 59 | Admitting: Speech Pathology

## 2022-07-19 NOTE — Progress Notes (Unsigned)
Assessment/Plan:    1.  Parkinson's disease, mod              -The biggest issue that I see in her review of her medicines has been intolerance to medication.  She is below the starting dose of levodopa, only taking the extended release twice per day.  She felt that even 3 times a day of this caused shortness of breath.  I tried to reach out to the pulmonologist that she has seen in the past, but he did not respond.  I am going to try to switch her over to [place].  Samples were provided.    2.  Sialorrhea             -This is commonly associated with PD.  We talked about treatments.  The patient is not a candidate for oral anticholinergic therapy because of increased risk of confusion and falls.  We discussed Botox (type A and B) and 1% atropine drops.  We discusssed that candy like lemon drops can help by stimulating mm of the oropharynx to induce swallowing.   3.  Constipation             -discussed nature and pathophysiology and association with PD             -discussed importance of hydration.  Pt is to increase water intake             -pt is given a copy of the rancho recipe             -recommended daily colace             -recommended miralax prn   4.  Hx of Neurogenic Orthostatic Hypotension             -has midodrine at home but hasn't taken             -will need to watch the norvasc.  She admits she has not been taking lately   5. We will see her back in early March, at which point I hope to increase her levodopa further.  Subjective:   Victoria Hanson was seen today in follow up for Parkinsons disease.  My previous records were reviewed prior to todays visit as well as outside records available to me. ***She called [place] in mid January stating that she could not tolerate 3 times daily levodopa, but she stated that she was taking twice daily levodopa and felt it helped.  I subsequently emailed the patient's pulmonologist for recommendations, given that I have never had  shortness of breath with levodopa, especially this far below the starting dose.  He did not answer my correspondence.  She has not seen him for quite some time.  She then called me on Friday stating that she wanted to increase her medication, and we told her that we would discuss her medication at today's visit.  Current prescribed movement disorder medications: ***   PREVIOUS MEDICATIONS:  ropinirole (took 1 day and said made sleepy, but levodopa was also started the same day; retried several years later at 0.25 mg and they called a week later and stated caused low BP but already had dx of noh); pramipexole (unclear if took it; 1 note stated that she did not take it and another note stated that she had hallucinations with it - pt does recall hallucinations with "one medication"); midodrine (given but didn't take it) ; rotigotine, 2 mg daily  ALLERGIES:  Allergies  Allergen Reactions   Macrobid [Nitrofurantoin Macrocrystal] Shortness Of Breath and Palpitations   Antihistamines, Diphenhydramine-Type Other (See Comments)    She does not know which antihistamine she has an allergic reaction to.   Aspirin Other (See Comments)    Makes her sick on the stomach if she takes too many and causes bleeding.  Of note, she can take ibuprofen. Other reaction(s): Other (See Comments) Makes her sick on the stomach.  Of note, she can take ibuprofen. Makes her sick on the stomach.  Of note, she can take ibuprofen.   Ceftin [Cefuroxime Axetil] Other (See Comments)    Chest pains   Celecoxib Other (See Comments)    Reaction: Blood Disorder GI bleeding.   Cymbalta [Duloxetine Hcl] Other (See Comments)    Reaction: unknown   Diphenhydramine Other (See Comments)   Escitalopram Other (See Comments)   Lexapro [Escitalopram Oxalate] Other (See Comments)    Reaction: Difficulty with speech   Metronidazole Other (See Comments)    Other reaction(s): Unknown   Nitrofurantoin Other (See Comments)   Zoloft  [Sertraline Hcl]    Ciprofloxacin Other (See Comments)    Reaction: unknown    CURRENT MEDICATIONS:  No outpatient medications have been marked as taking for the 07/23/22 encounter (Appointment) with Kenza Munar, Eustace Quail, DO.     Objective:   PHYSICAL EXAMINATION:    VITALS:  There were no vitals filed for this visit.  GEN:  The patient appears stated age and is in NAD. HEENT:  Normocephalic, atraumatic.  The mucous membranes are moist. The superficial temporal arteries are without ropiness or tenderness. CV:  RRR Lungs:  CTAB Neck/HEME:  There are no carotid bruits bilaterally.  Neurological examination:  Orientation: The patient is alert and oriented x3. Cranial nerves: There is good facial symmetry with*** facial hypomimia. The speech is fluent and clear. Soft palate rises symmetrically and there is no tongue deviation. Hearing is intact to conversational tone. Sensation: Sensation is intact to light touch throughout Motor: Strength is at least antigravity x4.  Movement examination: Tone: There is mod to severe increased tone , L>R, UE>LE Abnormal movements: none even with distraction procedures. Coordination:  There is mod decremation with RAM's, with any form of RAMS, including alternating supination and pronation of the forearm, hand opening and closing, finger taps, heel taps and toe taps. Gait and Station: The patient has significant difficulty arising out of a deep-seated chair without the use of the hands. She requires 2 person assist.  She is given a walker with 1 person assist.  She has freezing.  She shuffles and drags the L leg.  She does better in the open space.     I have reviewed and interpreted the following labs independently    Chemistry      Component Value Date/Time   NA 140 07/05/2022 1645   NA 140 02/17/2021 1029   NA 134 (L) 09/01/2014 1359   K 3.7 07/05/2022 1645   K 4.1 09/01/2014 1359   CL 106 07/05/2022 1645   CL 103 09/01/2014 1359   CO2 26  07/05/2022 1645   CO2 27 09/01/2014 1359   BUN 17 07/05/2022 1645   BUN 18 02/17/2021 1029   BUN 23 (H) 09/01/2014 1359   CREATININE 0.98 07/05/2022 1645   CREATININE 1.16 (H) 01/14/2017 1217      Component Value Date/Time   CALCIUM 8.9 07/05/2022 1645   CALCIUM 8.8 (L) 09/01/2014 1359   ALKPHOS 61 05/20/2022 1209  ALKPHOS 85 09/01/2014 1359   AST 23 05/20/2022 1209   AST 16 09/01/2014 1359   ALT 18 05/20/2022 1209   ALT 9 (L) 09/01/2014 1359   BILITOT 0.3 05/20/2022 1209   BILITOT 0.4 09/01/2014 1359       Lab Results  Component Value Date   WBC 10.7 (H) 05/20/2022   HGB 11.9 (L) 05/20/2022   HCT 37.2 05/20/2022   MCV 90.7 05/20/2022   PLT 254 05/20/2022    Lab Results  Component Value Date   TSH 1.67 03/09/2022     Total time spent on today's visit was ***30 minutes, including both face-to-face time and nonface-to-face time.  Time included that spent on review of records (prior notes available to me/labs/imaging if pertinent), discussing treatment and goals, answering patient's questions and coordinating care.  Cc:  Einar Pheasant, MD

## 2022-07-20 ENCOUNTER — Telehealth: Payer: Self-pay | Admitting: Neurology

## 2022-07-20 NOTE — Telephone Encounter (Signed)
Pt called in stating she is dragging her feet and wants to increase her carbidopa-levodopa.

## 2022-07-20 NOTE — Telephone Encounter (Addendum)
Tried calling patient back received message that patient has smart blocker and will not accept my call. Tried to call the patient back on mobile number and that number has been disconnected. Patients My chart is pending so I have no way of calling this patient back. She does have an appointment on Monday with Dr. Carles Collet

## 2022-07-23 ENCOUNTER — Other Ambulatory Visit: Payer: Self-pay | Admitting: Internal Medicine

## 2022-07-23 ENCOUNTER — Ambulatory Visit: Payer: 59 | Admitting: Speech Pathology

## 2022-07-23 ENCOUNTER — Ambulatory Visit (INDEPENDENT_AMBULATORY_CARE_PROVIDER_SITE_OTHER): Payer: 59 | Admitting: Neurology

## 2022-07-23 ENCOUNTER — Encounter: Payer: Self-pay | Admitting: Neurology

## 2022-07-23 VITALS — BP 120/78 | HR 69 | Ht 63.0 in | Wt 164.4 lb

## 2022-07-23 DIAGNOSIS — G20A2 Parkinson's disease without dyskinesia, with fluctuations: Secondary | ICD-10-CM

## 2022-07-23 MED ORDER — CARBIDOPA-LEVODOPA ER 25-100 MG PO TBCR: EXTENDED_RELEASE_TABLET | ORAL | 1 refills | Status: DC

## 2022-07-23 NOTE — Patient Instructions (Addendum)
Week 1 and 2: Take carbidopa/levodopa 25/100 CR, 1 at 7am/1 at 11am/1 at 4pm and 2 at BED  Week 3 and beyond: Take carbidopa/levodopa 25/100 CR, 2 at 7am/1 at 11am/1 at 4pm and 2 at BED  Local and Online Resources for Power over Parkinson's Group  February 2024   [place] over Parkinson's Group:    Power Over Parkinson's Patient Education Group will be Wednesday, February 14th-*Hybrid meting*- in person at [facility] location and via [facility], 2:00-3:00 pm.   Starting in November, Power over [employer] and Care Partner Groups will meet together, with plans for separate break out session for caregivers (*this will be evolving over the next few months) Upcoming Power over Parkinson's Meetings/Care Partner Support:  2nd Wednesdays of the month at 2 pm:  February 14th, March 13th  [place] at [email or web address] if interested in participating in this group    [place]! Moves Classes in [place]!  Starting July 05, 2022.  Thursdays, January 25-August 16, 2022 at 11:45 am.  [facility].  Free to participate (through grant with [employer]) and registration required.  Please contact Corwin Levins at [email or web address] to register. Let's Try Pickleball-$25 for 6 weeks of Pickleball, starting February 2nd.  Contact Corwin Levins for more details.  [email or web address] Parkinson's Community Game Night at [facility], Date TBA in Late February, email Sarah for details [email or web address] THRIVE, an exercise group for early-onset Parkinson's Disease will resume meeting in February, email Sarah for details [email or web address] Parkinson's CarePartner Group for Men is in the works, if interested email [email or web address] ACT FITNESS Chair Yoga classes "Train and Gain", Fridays 10 am, [employer].  Contact Gina at  [phone].  PWR! Moves Dynegy Instructor-Led Classes offering at [employer]!  TUESDAYS and Wednesdays 1-2 pm.   Contact Vonna Kotyk at  [email or web address]  or [phone] (Tuesday classes are modified for chair and standing only) Drumming for Parkinson's will be held on 2nd and 4th Mondays at 11:00 am.   Located at the [place] ([place]  Contact Doylene Canning at [email or web address] or [phone]  Dance for Parkinson 's classes will be on Tuesdays 9:30am-10:30am starting back in February. Located in the [employer] , in the first floor of the [employer] ([place] To register:  [email or web address] or [place] Class, Mondays at 11 am.  Call [phone] for details SAVE THE DATE and REGISTER:  [place] Chapter of [place]:  [employer].  Conversations about Parkinson's.  Saturday, August 11, 2022, 9:00 am-2:00 pm.  [place], *In person or online via Sims*.  Register at [email or web address] or call Beverlee Nims at [phone].  [place]:  www.[email or web address]  PD Health at Home continues:  Mindfulness Mondays, Wellness Wednesdays, Fitness Fridays   Upcoming Education:   Parkinson's 101.  Wednesday, February 7th, 1-2 pm The Changing Landscape of Intimacy.  Wednesday, February 14th, 1-2 pm New to Parkinson's:  Care Partner Basics.  Wednesday, February 28th, 1-2 pm Expert Briefing:  Understanding Pain in Parkinson's.   Wednesday, April 10th,  1-2 pm  Register for virtual education and Patent attorney (webinars) at [email or web address].pl Please check out their website to sign up for emails and see their full online offerings      [place]:  [email or web address]   Third Thursday Webinars:  On the  third Thursday of every month at 12 p.m. ET, join our free live webinars to learn about various aspects of living with Parkinson's disease and our work to speed medical breakthroughs.  Upcoming Webinar:  Therapies for Tomorrow:  How Better Clinical Trial Design Leads to Better Treatments.  Thursday, February 15th at 12 noon. Check out additional information on their website to see their full online offerings    [facility]:  [email or web address]  Upcoming Webinar:   [place].  Thursday, February 8th, 12 noon Webinar Series:  Living with Parkinson's Meetup.   Third Thursdays each month, 3 pm  Care Partner Monthly Meetup.  With Robin Searing Phinney.  First Tuesday of each month, 2 pm  Check out additional information to Live Well Today on their website    Parkinson and Movement Disorders (PMD) Alliance:  www.pmdalliance.org  NeuroLife Online:  Online Education Events  Sign up for emails, which are sent weekly to give you updates on programming and online offerings    Parkinson's Association of the [place]:  www.[email or web address]  Information on online support groups, education events, and online exercises including Yoga, Parkinson's exercises and more-LOTS of information on links to PD resources and online events  Virtual Support Group through Parkinson's Association of the [place]; next one is scheduled for Wednesday, Feb 7th  MOVEMENT AND EXERCISE OPPORTUNITIES  PWR! Moves Classes at [place].  Wednesdays 10 and 11 am.   Contact Amy Marriott, PT [email or web address] if interested.  PWR! Moves Class offerings at [employer]. *TUESDAYS* and Wednesdays 1-2 pm.    Contact Vonna Kotyk at  [email or web address]    Parkinson's Wellness Recovery (PWR! Moves)  www.[email or web address]  Info on the PWR! Virtual Experience:  You will have access to our expertise?through self-assessment, guided  plans that start with the PD-specific fundamentals, educational content, tips, Q&A with an expert, and a growing Art therapist of PD-specific pre-recorded and live exercise classes of varying types and intensity - both physical and cognitive! If that is not enough, we offer 1:1 wellness consultations (in-person or virtual) to personalize your PWR! Research scientist (medical).   [place] Fridays:   As part of the PD Health @ Home program, this free video series focuses each week on one aspect of fitness designed to support people living with Parkinson's.? These weekly videos highlight the [place] fitness guidelines for people with Parkinson's disease.  ModemGamers.si  Dance for PD website is offering free, live-stream classes throughout the week, as well as links to [employer] of classes:  [email or web address]  Virtual dance and Pilates for Parkinson's classes: Click on the Community Tab> Parkinson's Movement Initiative Tab.  To register for classes and for more information, visit [email or web address] and click the "community" tab.   [employer] Parkinson's Cycling Classes   [facility] [employer]:  Thursdays @ Noon-Live classes at [employer] ([employer] at [email or web address]?or [phone])  [facility]: Virtual Classes Mondays and Thursdays Jeanette Caprice classes Tuesday, Wednesday and Thursday (contact [place] at [email or web address] ?or [phone])  [place]  Varied levels of classes are offered Tuesdays and Thursdays at [employer].   Stretching with Verdis Frederickson weekly class is also offered for people with Parkinson's  To observe a class or for more information, call [phone] or email Hezzie Bump at [email or web address]   ADDITIONAL SUPPORT AND RESOURCES  Well[employer]Spring [employer]:Online Caregiver Education Opportunities:   www.well-springsolutions.org/caregiver-education/caregiver-support-group.  You may also contact Vickki Muff at [email or web address] or [phone].     Family Caregiver  [place].  Thursday, March 7th, 10:15-1:45 at [facility].  Register with Vickki Muff (see above) Well-Spring Navigator:  Just1Navigator program, a?free service to help individuals and families through the journey of determining care for older adults.  The "Navigator" is a Education officer, museum, Arnell Asal, who will speak with a prospective client and/or loved ones to provide an assessment of the situation and a set of recommendations for a personalized care plan -- all free of charge, and whether?Well-Spring Solutions offers the needed service or not. If the need is not a service we provide, we are well-connected with reputable programs in town that we can refer you to.  www.well-springsolutions.org or to speak with the Navigator, call [phone].

## 2022-07-24 ENCOUNTER — Ambulatory Visit: Payer: 59 | Admitting: Speech Pathology

## 2022-07-25 ENCOUNTER — Ambulatory Visit: Payer: 59 | Admitting: Speech Pathology

## 2022-07-26 ENCOUNTER — Ambulatory Visit: Payer: 59 | Admitting: Speech Pathology

## 2022-07-30 ENCOUNTER — Ambulatory Visit: Payer: 59 | Admitting: Speech Pathology

## 2022-07-31 ENCOUNTER — Ambulatory Visit: Payer: 59 | Admitting: Speech Pathology

## 2022-08-01 ENCOUNTER — Ambulatory Visit: Payer: 59 | Admitting: Speech Pathology

## 2022-08-02 ENCOUNTER — Encounter: Payer: Self-pay | Admitting: Internal Medicine

## 2022-08-02 ENCOUNTER — Ambulatory Visit: Payer: 59 | Admitting: Speech Pathology

## 2022-08-02 ENCOUNTER — Ambulatory Visit (INDEPENDENT_AMBULATORY_CARE_PROVIDER_SITE_OTHER): Payer: 59 | Admitting: Internal Medicine

## 2022-08-02 VITALS — BP 138/78 | HR 79 | Temp 97.9°F | Resp 16 | Ht 63.0 in | Wt 164.0 lb

## 2022-08-02 DIAGNOSIS — I700 Atherosclerosis of aorta: Secondary | ICD-10-CM

## 2022-08-02 DIAGNOSIS — D649 Anemia, unspecified: Secondary | ICD-10-CM | POA: Diagnosis not present

## 2022-08-02 DIAGNOSIS — Z85038 Personal history of other malignant neoplasm of large intestine: Secondary | ICD-10-CM

## 2022-08-02 DIAGNOSIS — R739 Hyperglycemia, unspecified: Secondary | ICD-10-CM

## 2022-08-02 DIAGNOSIS — N183 Chronic kidney disease, stage 3 unspecified: Secondary | ICD-10-CM | POA: Diagnosis not present

## 2022-08-02 DIAGNOSIS — I779 Disorder of arteries and arterioles, unspecified: Secondary | ICD-10-CM

## 2022-08-02 DIAGNOSIS — F419 Anxiety disorder, unspecified: Secondary | ICD-10-CM | POA: Diagnosis not present

## 2022-08-02 DIAGNOSIS — Z8589 Personal history of malignant neoplasm of other organs and systems: Secondary | ICD-10-CM

## 2022-08-02 DIAGNOSIS — I773 Arterial fibromuscular dysplasia: Secondary | ICD-10-CM | POA: Diagnosis not present

## 2022-08-02 DIAGNOSIS — K219 Gastro-esophageal reflux disease without esophagitis: Secondary | ICD-10-CM

## 2022-08-02 DIAGNOSIS — Z96652 Presence of left artificial knee joint: Secondary | ICD-10-CM | POA: Diagnosis not present

## 2022-08-02 DIAGNOSIS — G903 Multi-system degeneration of the autonomic nervous system: Secondary | ICD-10-CM

## 2022-08-02 DIAGNOSIS — E7800 Pure hypercholesterolemia, unspecified: Secondary | ICD-10-CM

## 2022-08-02 DIAGNOSIS — G20A1 Parkinson's disease without dyskinesia, without mention of fluctuations: Secondary | ICD-10-CM

## 2022-08-02 DIAGNOSIS — F339 Major depressive disorder, recurrent, unspecified: Secondary | ICD-10-CM

## 2022-08-02 NOTE — Progress Notes (Signed)
Subjective:    Patient ID: Victoria Hanson, female    DOB: 08/12/1950, 72 y.o.   MRN: [id number]  Patient here for  Chief Complaint  Patient presents with   Medical Management of Chronic Issues    HPI Here to follow up regarding her blood pressure and parkinsons.  Seeing Dr Tat.  Just evaluated 07/23/22. On sinemet and tolerating.  Taking one qid.  Tolerating.  Moving better.  Having issues with her knee - left knee.  Planning to follow up with ortho.  Off amlodipine.  Blood pressure ok.  No chest pain.  Breathing stable.  No increased cough or congestion.  No acid reflux.  No abdominal pain reported.  Handling stress.      Past Medical History:  Diagnosis Date   Anxiety    Asthma    Atypical chest pain    a. 2003 Cath: reportedly nl per pt [name]);  b. 04/2015 [facility]mission, neg troponin.   Colon cancer ([place]) 1999   a. 1999: Pt had partial colectomy. Did develop lung metastasis. Had right upper lobectomy in 01/2007 then had associated chemotherapy.   COPD (chronic obstructive pulmonary disease) (HCC)    DDD (degenerative disc disease), lumbosacral    Depression    Echocardiogram abnormal    a. 02/2010 Echo: EF 55-60%, mild LAE, no regional wall motion abnormalities   Elevated transaminase level    a. ? NASH;  b. 06/2015 - nl LFTs.   GERD (gastroesophageal reflux disease)    Hematuria    Holter monitor, abnormal    a. 12/2009 Holter monitoring: NSR, rare PACs and PVCs.   Hypercholesterolemia    Hyperlipidemia    Hypertension    Leaky heart valve    a. Per pt report - no evidence of valvular abnormalities on prior echoes.   Lesion of skin of Right breast 10/20/2017   Lung metastases 2008   a. S/P resection (lobectomy - right)   Morbid obesity ([place])    Nephrolithiasis    pt denies   Parkinson disease 08/2016   [name]   Past Surgical History:  Procedure Laterality Date   BREAST EXCISIONAL BIOPSY Right 10/07/2009   benign   CARDIAC CATHETERIZATION  2003   negative  as per pt report   CHOLECYSTECTOMY  2000   COLON SURGERY     Colon cancer, removed part of large colon   COLONOSCOPY N/A 04/25/2015   Procedure: COLONOSCOPY;  Surgeon: Manya Silvas, MD;  Location: [facility];  Service: Endoscopy;  Laterality: N/A;   COLONOSCOPY WITH PROPOFOL N/A 06/14/2017   Procedure: COLONOSCOPY WITH PROPOFOL;  Surgeon: Manya Silvas, MD;  [facility] ENDOSCOPY;  Service: Endoscopy;  Laterality: N/A;   COLONOSCOPY WITH PROPOFOL N/A 11/17/2019   Procedure: COLONOSCOPY WITH PROPOFOL;  Surgeon: [name], MD;  [facility] ENDOSCOPY;  Service: Gastroenterology;  Laterality: N/A;   COLONOSCOPY WITH PROPOFOL N/A 03/21/2021   Procedure: COLONOSCOPY WITH PROPOFOL;  Surgeon: Lesly Rubenstein, MD;  Location: [facility] ENDOSCOPY;  Service: Endoscopy;  Laterality: N/A;   ESOPHAGOGASTRODUODENOSCOPY (EGD) WITH PROPOFOL N/A 03/21/2021   Procedure: ESOPHAGOGASTRODUODENOSCOPY (EGD) WITH PROPOFOL;  Surgeon: Lesly Rubenstein, MD;  Location: [facility] ENDOSCOPY;  Service: Endoscopy;  Laterality: N/A;   LOBECTOMY  01/2007   Right lung, upper lobe right side removed   TOTAL ABDOMINAL HYSTERECTOMY     abnormal bleeding   TUBAL LIGATION  1980   Family History  Problem Relation Age of Onset   Coronary artery disease Mother  died @ 51 of MI.   Mental illness Mother    Diabetes Mother    Heart attack Father 32       MI   Cancer Father        lung - died in his mid-70's   Hypertension Father    Diabetes Father    Sudden death Brother    Mental illness Brother    Heart attack Brother        MI @ 28, died @ age 28.   Hypertension Child    Breast cancer Neg Hx    Social History   Socioeconomic History   Marital status: Widowed    Spouse name: Not on file   Number of children: Not on file   Years of education: Not on file   Highest education level: Not on file  Occupational History   Not on file  Tobacco Use   Smoking status: Never    Passive exposure: Yes    Smokeless tobacco: Never   Tobacco comments:    husband and son smoked in her home.  Vaping Use   Vaping Use: Never used  Substance and Sexual Activity   Alcohol use: Not Currently    Alcohol/week: 0.0 standard drinks of alcohol   Drug use: No   Sexual activity: Never  Other Topics Concern   Not on file  Social History Narrative   Widowed.   Disabled (colon/lung CA), retired.   Lives in [place] with her dtr, grand-dtr, and son.   Active.   Gets regular exercise, walks.   Right handed    Social Determinants of Health   Financial Resource Strain: Low Risk  (11/29/2021)   Overall Financial Resource Strain (CARDIA)    Difficulty of Paying Living Expenses: Not hard at all  Food Insecurity: No Food Insecurity (11/29/2021)   Hunger Vital Sign    Worried About Running Out of Food in the Last Year: Never true    Ran Out of Food in the Last Year: Never true  Transportation Needs: No Transportation Needs (11/29/2021)   PRAPARE - Hydrologist (Medical): No    Lack of Transportation (Non-Medical): No  Physical Activity: Not on file  Stress: No Stress Concern Present (11/29/2021)   [place]    Feeling of Stress : Only a little  Social Connections: Unknown (11/29/2021)   Social Connection and Isolation Panel [NHANES]    Frequency of Communication with Friends and Family: More than three times a week    Frequency of Social Gatherings with Friends and Family: More than three times a week    Attends Religious Services: Not on Advertising copywriter or Organizations: Not on file    Attends Archivist Meetings: Not on file    Marital Status: Not on file     Review of Systems  Constitutional:  Negative for appetite change.       Weight stable from recent checks.    HENT:  Negative for congestion and sinus pressure.   Respiratory:  Negative for cough and chest tightness.         Breathing stable.   Cardiovascular:  Negative for chest pain and palpitations.  Gastrointestinal:  Negative for abdominal pain, diarrhea, nausea and vomiting.  Genitourinary:  Negative for difficulty urinating and dysuria.  Musculoskeletal:  Negative for myalgias.       Knee pain as outlined.  Skin:  Negative for color change and rash.  Neurological:  Negative for dizziness and headaches.  Psychiatric/Behavioral:  Negative for agitation and dysphoric mood.        Objective:     BP 138/78   Pulse 79   Temp 97.9 F (36.6 C)   Resp 16   Ht '5\' 3"'$  (1.6 m)   Wt 164 lb (74.4 kg)   SpO2 99%   BMI 29.05 kg/m  Wt Readings from Last 3 Encounters:  08/02/22 164 lb (74.4 kg)  07/23/22 164 lb 6.4 oz (74.6 kg)  07/05/22 165 lb 9.6 oz (75.1 kg)    Physical Exam Vitals reviewed.  Constitutional:      General: She is not in acute distress.    Appearance: Normal appearance.  HENT:     Head: Normocephalic and atraumatic.     Right Ear: External ear normal.     Left Ear: External ear normal.  Eyes:     General: No scleral icterus.       Right eye: No discharge.        Left eye: No discharge.     Conjunctiva/sclera: Conjunctivae normal.  Neck:     Thyroid: No thyromegaly.  Cardiovascular:     Rate and Rhythm: Normal rate and regular rhythm.  Pulmonary:     Effort: No respiratory distress.     Breath sounds: Normal breath sounds. No wheezing.  Abdominal:     General: Bowel sounds are normal.     Palpations: Abdomen is soft.     Tenderness: There is no abdominal tenderness.  Musculoskeletal:        General: No swelling or tenderness.     Cervical back: Neck supple. No tenderness.  Lymphadenopathy:     Cervical: No cervical adenopathy.  Skin:    Findings: No erythema or rash.  Neurological:     Mental Status: She is alert.  Psychiatric:        Mood and Affect: Mood normal.        Behavior: Behavior normal.      Outpatient Encounter Medications as of 08/02/2022   Medication Sig   ALPRAZolam (XANAX) 0.5 MG tablet TAKE 1/2 TABLET BY MOUTH ONCE DAILY AS NEEDED   aspirin EC 81 MG tablet Take 81 mg by mouth as needed.    Carbidopa-Levodopa ER (SINEMET CR) 25-100 MG tablet controlled release 2 at 7am/1 at 11am/1 at 4pm and 2 at BED   cholecalciferol (VITAMIN D3) 25 MCG (1000 UNIT) tablet Take 1,000 Units by mouth daily.   citalopram (CELEXA) 10 MG tablet TAKE ONE AND ONE-HALF TABLET BY MOUTH DAILY   fluticasone-salmeterol (ADVAIR HFA) 115-21 MCG/ACT inhaler Inhale 2 puffs into the lungs 2 (two) times daily.   furosemide (LASIX) 20 MG tablet Take 1 tablet (20 mg total) by mouth daily.   HYDROcodone-acetaminophen (NORCO/VICODIN) 5-325 MG tablet Take 1 tablet by mouth every 6 (six) hours as needed for moderate pain.   montelukast (SINGULAIR) 10 MG tablet TAKE 1 TABLET BY MOUTH AT BEDTIME   PROAIR HFA 108 (90 Base) MCG/ACT inhaler INHALE 2 PUFFS BY MOUTH INTO THE LUNGS EVERY 6 HOURS AS NEEDED FOR WHEEZING   rosuvastatin (CRESTOR) 5 MG tablet TAKE 1 TABLET BY MOUTH EVERY MONDAY, WEDNESDAY AND FRIDAY   traZODone (DESYREL) 50 MG tablet TAKE 2 TABLETS BY MOUTH AT BEDTIME   vitamin B-12 (CYANOCOBALAMIN) 100 MCG tablet Take 100 mcg by mouth daily.   No facility-administered encounter medications on file as of 08/02/2022.  Lab Results  Component Value Date   WBC 10.7 (H) 05/20/2022   HGB 11.9 (L) 05/20/2022   HCT 37.2 05/20/2022   PLT 254 05/20/2022   GLUCOSE 104 (H) 07/05/2022   CHOL 207 (H) 03/09/2022   TRIG 77.0 03/09/2022   HDL 72.60 03/09/2022   LDLDIRECT 163.4 12/31/2012   LDLCALC 119 (H) 03/09/2022   ALT 18 05/20/2022   AST 23 05/20/2022   NA 140 07/05/2022   K 3.7 07/05/2022   CL 106 07/05/2022   CREATININE 0.98 07/05/2022   BUN 17 07/05/2022   CO2 26 07/05/2022   TSH 1.67 03/09/2022   INR 1.0 12/12/2012   HGBA1C 5.9 03/09/2022    US Venous Img Lower Unilateral Left  Result Date: 05/20/2022 CLINICAL DATA:  Pain and swelling of the  left leg EXAM: Left LOWER EXTREMITY VENOUS DOPPLER ULTRASOUND TECHNIQUE: Gray-scale sonography with compression, as well as color and duplex ultrasound, were performed to evaluate the deep venous system(s) from the level of the common femoral vein through the popliteal and proximal calf veins. COMPARISON:  None Available. FINDINGS: VENOUS Normal compressibility of the common femoral, superficial femoral, and popliteal veins, as well as the visualized calf veins. Visualized portions of profunda femoral vein and great saphenous vein unremarkable. No filling defects to suggest DVT on grayscale or color Doppler imaging. Doppler waveforms show normal direction of venous flow, normal respiratory plasticity and response to augmentation. Limited views of the contralateral common femoral vein are unremarkable. OTHER None. Limitations: none IMPRESSION: Negative. Electronically Signed   By: Nelson Chimes M.D.   On: 05/20/2022 14:24   CT HEAD WO CONTRAST (5MM)  Result Date: 05/20/2022 CLINICAL DATA:  Neuro deficit, acute, stroke suspected EXAM: CT HEAD WITHOUT CONTRAST TECHNIQUE: Contiguous axial images were obtained from the base of the skull through the vertex without intravenous contrast. RADIATION DOSE REDUCTION: This exam was performed according to the departmental dose-optimization program which includes automated exposure control, adjustment of the mA and/or kV according to patient size and/or use of iterative reconstruction technique. COMPARISON:  11/05/2021. FINDINGS: Brain: No evidence of acute infarction, hemorrhage, hydrocephalus, extra-axial collection or mass lesion/mass effect. Prominence of the ventricles, sulci and cisterns noted consistent with age-related involutional changes. Vascular: No hyperdense vessel or unexpected calcification. Skull: Normal. Negative for fracture or focal lesion. Sinuses/Orbits: No acute finding. IMPRESSION: No acute intracranial process. Electronically Signed   By: Sammie Bench M.D.   On: 05/20/2022 13:37   DG Chest Portable 1 View  Result Date: 05/20/2022 CLINICAL DATA:  weakness, leg swelling, hx CHF EXAM: PORTABLE CHEST - 1 VIEW COMPARISON:  11/20/2021 FINDINGS: Cardiac silhouette is unremarkable. No pneumothorax or pleural effusion. The lungs are clear. The visualized skeletal structures are unremarkable. IMPRESSION: No acute cardiopulmonary process. Electronically Signed   By: Sammie Bench M.D.   On: 05/20/2022 12:55       Assessment & Plan:  Gastroesophageal reflux disease without esophagitis Assessment & Plan: Upper symptoms appear to be controlled on prilosec.  Follow.    Anemia, unspecified type Assessment & Plan: Follow cbc.    Anxiety, mild Assessment & Plan: Continue celexa.  Follow. Doing better.  Feels better.    Aortic atherosclerosis (HCC) Assessment & Plan: Crestor.      Bilateral carotid artery disease, unspecified type ([place]) Assessment & Plan: Previously evaluated by [facility].  Continue crestor.     Stage 3 chronic kidney disease, unspecified whether stage 3a or 3b CKD ([place]) Assessment & Plan: Avoid antiinflammatories.  Follow pressures.  Off micardis due to low blood pressure.  Follow. Follow metabolic panel. Confirm lasix dosing.  Follow metabolic panel.    Depression, recurrent ([place]) Assessment & Plan: Continue citalopram.   Follow. Doing better.    Fibromuscular dysplasia ([place]) Assessment & Plan: Has been evaluated by [facility].  Continue risk factor modificaiton.    History of colon cancer Assessment & Plan: Recent attempt at colonoscopy - poor prep.   Fiber and probiotics to help with GI symptoms. GI recommended follow cbc and iron studies.    History of malignant neoplasm metastatic to lung Assessment & Plan: S/p lobectomy.  Followed by pulmonary.  Breathing stable.    History of total left knee replacement Assessment & Plan: S/p left TKA.  Has been followed by [facility]. Persistent increased pain.   Request to see Dr Alluisio.  Appt 09/06/22.    Hypercholesterolemia Assessment & Plan: Crestor.  Low cholesterol diet and exercise. Follow lipid panel and liver function tests.    Hyperglycemia Assessment & Plan: Low carb diet and exercise.  Follow met b and a1c.    Orthostatic hypotension due to Parkinson's disease [facility]) Assessment & Plan: Overall doing better.  Following pressures.     Parkinson's disease, unspecified whether dyskinesia present, unspecified whether manifestations fluctuate Assessment & Plan: Seeing Dr Tat.  On sinemet qid now.  Doing better.  Feels better.  Has been working with therapy.       Einar Pheasant, MD

## 2022-08-06 ENCOUNTER — Ambulatory Visit: Payer: 59 | Admitting: Speech Pathology

## 2022-08-07 ENCOUNTER — Ambulatory Visit: Payer: 59 | Admitting: Speech Pathology

## 2022-08-08 ENCOUNTER — Encounter: Payer: Medicare Other | Admitting: Speech Pathology

## 2022-08-08 DIAGNOSIS — M1711 Unilateral primary osteoarthritis, right knee: Secondary | ICD-10-CM | POA: Diagnosis not present

## 2022-08-08 DIAGNOSIS — I83812 Varicose veins of left lower extremities with pain: Secondary | ICD-10-CM | POA: Diagnosis not present

## 2022-08-08 DIAGNOSIS — Z01812 Encounter for preprocedural laboratory examination: Secondary | ICD-10-CM | POA: Diagnosis not present

## 2022-08-08 DIAGNOSIS — R2242 Localized swelling, mass and lump, left lower limb: Secondary | ICD-10-CM | POA: Diagnosis not present

## 2022-08-08 DIAGNOSIS — M25562 Pain in left knee: Secondary | ICD-10-CM | POA: Diagnosis not present

## 2022-08-08 DIAGNOSIS — Z01818 Encounter for other preprocedural examination: Secondary | ICD-10-CM | POA: Diagnosis not present

## 2022-08-08 DIAGNOSIS — I10 Essential (primary) hypertension: Secondary | ICD-10-CM | POA: Diagnosis not present

## 2022-08-08 DIAGNOSIS — Z96652 Presence of left artificial knee joint: Secondary | ICD-10-CM | POA: Diagnosis not present

## 2022-08-09 ENCOUNTER — Encounter: Payer: Medicare Other | Admitting: Speech Pathology

## 2022-08-11 DIAGNOSIS — M25562 Pain in left knee: Secondary | ICD-10-CM | POA: Diagnosis not present

## 2022-08-11 DIAGNOSIS — Z96652 Presence of left artificial knee joint: Secondary | ICD-10-CM | POA: Diagnosis not present

## 2022-08-12 ENCOUNTER — Encounter: Payer: Self-pay | Admitting: Internal Medicine

## 2022-08-12 NOTE — Assessment & Plan Note (Signed)
S/p lobectomy.  Followed by pulmonary.  Breathing stable.

## 2022-08-12 NOTE — Assessment & Plan Note (Signed)
-   Crestor 

## 2022-08-12 NOTE — Assessment & Plan Note (Signed)
Seeing Dr Tat.  On sinemet qid now.  Doing better.  Feels better.  Has been working with therapy.

## 2022-08-12 NOTE — Assessment & Plan Note (Signed)
Continue citalopram.   Follow. Doing better.

## 2022-08-12 NOTE — Assessment & Plan Note (Signed)
Low carb diet and exercise.  Follow met b and a1c.   

## 2022-08-12 NOTE — Assessment & Plan Note (Signed)
Upper symptoms appear to be controlled on prilosec.  Follow.

## 2022-08-12 NOTE — Assessment & Plan Note (Signed)
S/p left TKA.  Has been followed by [facility]. Persistent increased pain.  Request to see Dr Alluisio.  Appt 09/06/22.

## 2022-08-12 NOTE — Assessment & Plan Note (Signed)
Continue celexa.  Follow. Doing better.  Feels better.

## 2022-08-12 NOTE — Assessment & Plan Note (Signed)
Previously evaluated by [facility].  Continue crestor.

## 2022-08-12 NOTE — Assessment & Plan Note (Signed)
Follow cbc.  

## 2022-08-12 NOTE — Assessment & Plan Note (Signed)
Recent attempt at colonoscopy - poor prep.   Fiber and probiotics to help with GI symptoms. GI recommended follow cbc and iron studies.

## 2022-08-12 NOTE — Assessment & Plan Note (Signed)
Overall doing better.  Following pressures.

## 2022-08-12 NOTE — Assessment & Plan Note (Signed)
Crestor.  Low cholesterol diet and exercise. Follow lipid panel and liver function tests.

## 2022-08-12 NOTE — Assessment & Plan Note (Signed)
Avoid antiinflammatories.  Follow pressures.  Off micardis due to low blood pressure.  Follow. Follow metabolic panel. Confirm lasix dosing.  Follow metabolic panel.

## 2022-08-12 NOTE — Assessment & Plan Note (Signed)
Has been evaluated by [facility].  Continue risk factor modificaiton.

## 2022-08-16 ENCOUNTER — Ambulatory Visit: Payer: Medicare Other | Admitting: Neurology

## 2022-08-30 ENCOUNTER — Encounter: Payer: Self-pay | Admitting: Internal Medicine

## 2022-08-30 ENCOUNTER — Ambulatory Visit (INDEPENDENT_AMBULATORY_CARE_PROVIDER_SITE_OTHER): Payer: 59 | Admitting: Internal Medicine

## 2022-08-30 VITALS — BP 136/78 | HR 72 | Temp 98.2°F | Resp 16 | Ht 63.0 in | Wt 156.0 lb

## 2022-08-30 DIAGNOSIS — I700 Atherosclerosis of aorta: Secondary | ICD-10-CM | POA: Diagnosis not present

## 2022-08-30 DIAGNOSIS — K760 Fatty (change of) liver, not elsewhere classified: Secondary | ICD-10-CM | POA: Diagnosis not present

## 2022-08-30 DIAGNOSIS — Z8589 Personal history of malignant neoplasm of other organs and systems: Secondary | ICD-10-CM | POA: Diagnosis not present

## 2022-08-30 DIAGNOSIS — R2681 Unsteadiness on feet: Secondary | ICD-10-CM

## 2022-08-30 DIAGNOSIS — R739 Hyperglycemia, unspecified: Secondary | ICD-10-CM

## 2022-08-30 DIAGNOSIS — N183 Chronic kidney disease, stage 3 unspecified: Secondary | ICD-10-CM | POA: Diagnosis not present

## 2022-08-30 DIAGNOSIS — G20A1 Parkinson's disease without dyskinesia, without mention of fluctuations: Secondary | ICD-10-CM

## 2022-08-30 DIAGNOSIS — F419 Anxiety disorder, unspecified: Secondary | ICD-10-CM

## 2022-08-30 DIAGNOSIS — K219 Gastro-esophageal reflux disease without esophagitis: Secondary | ICD-10-CM | POA: Diagnosis not present

## 2022-08-30 DIAGNOSIS — D649 Anemia, unspecified: Secondary | ICD-10-CM

## 2022-08-30 DIAGNOSIS — I779 Disorder of arteries and arterioles, unspecified: Secondary | ICD-10-CM | POA: Diagnosis not present

## 2022-08-30 DIAGNOSIS — J449 Chronic obstructive pulmonary disease, unspecified: Secondary | ICD-10-CM

## 2022-08-30 DIAGNOSIS — I773 Arterial fibromuscular dysplasia: Secondary | ICD-10-CM

## 2022-08-30 DIAGNOSIS — E7800 Pure hypercholesterolemia, unspecified: Secondary | ICD-10-CM

## 2022-08-30 DIAGNOSIS — Z85038 Personal history of other malignant neoplasm of large intestine: Secondary | ICD-10-CM | POA: Diagnosis not present

## 2022-08-30 DIAGNOSIS — Z96652 Presence of left artificial knee joint: Secondary | ICD-10-CM | POA: Diagnosis not present

## 2022-08-30 DIAGNOSIS — R7989 Other specified abnormal findings of blood chemistry: Secondary | ICD-10-CM

## 2022-08-30 DIAGNOSIS — F339 Major depressive disorder, recurrent, unspecified: Secondary | ICD-10-CM

## 2022-08-30 NOTE — Progress Notes (Addendum)
Subjective:    Patient ID: Victoria Hanson, female    DOB: [birth year], 72 y.o.   MRN: [id number]  Patient here for  Chief Complaint  Patient presents with   Medical Management of Chronic Issues    HPI Here to follow up regarding her blood pressure.  Seeing Dr Tat for - Parkinsons.  On sinemet.  Dose being adjusted.  Currently taking 2 tablets 7:00, 1 tablet 11:00, 1 tablet at 4:00 and 2 tablets at bedtime.  (Will split the night tablets for "better tolerance").  Is getting around better.  Gait is better.  Feels better.  Still having issues with her knee.  Has been seeing Dr Christella Hartigan - last seen 08/09/22.  Discussed physical therapy.  Due to see Dr Lenord Carbo 09/05/22 - per pt request.  No chest pain.  Breathing stable.  No increased cough or congestion.  No abdominal pain or bowel pain reported.     Past Medical History:  Diagnosis Date   Anxiety    Asthma    Atypical chest pain    a. 2003 Cath: reportedly nl per pt [name]);  b. 04/2015 [place] Admission, neg troponin.   Colon cancer ([place]) 1999   a. 1999: Pt had partial colectomy. Did develop lung metastasis. Had right upper lobectomy in 01/2007 then had associated chemotherapy.   COPD (chronic obstructive pulmonary disease) (HCC)    DDD (degenerative disc disease), lumbosacral    Depression    Echocardiogram abnormal    a. 02/2010 Echo: EF 55-60%, mild LAE, no regional wall motion abnormalities   Elevated transaminase level    a. ? NASH;  b. 06/2015 - nl LFTs.   GERD (gastroesophageal reflux disease)    Hematuria    Holter monitor, abnormal    a. 12/2009 Holter monitoring: NSR, rare PACs and PVCs.   Hypercholesterolemia    Hyperlipidemia    Hypertension    Leaky heart valve    a. Per pt report - no evidence of valvular abnormalities on prior echoes.   Lesion of skin of Right breast 10/20/2017   Lung metastases 2008   a. S/P resection (lobectomy - right)   Morbid obesity ([place])    Nephrolithiasis    pt denies   Parkinson disease  08/2016   [name]   Past Surgical History:  Procedure Laterality Date   BREAST EXCISIONAL BIOPSY Right 10/07/2009   benign   CARDIAC CATHETERIZATION  2003   negative as per pt report   CHOLECYSTECTOMY  2000   COLON SURGERY     Colon cancer, removed part of large colon   COLONOSCOPY N/A 04/25/2015   Procedure: COLONOSCOPY;  Surgeon: Manya Silvas, MD;  Location: [facility];  Service: Endoscopy;  Laterality: N/A;   COLONOSCOPY WITH PROPOFOL N/A 06/14/2017   Procedure: COLONOSCOPY WITH PROPOFOL;  Surgeon: Manya Silvas, MD;  Location: [facility] ENDOSCOPY;  Service: Endoscopy;  Laterality: N/A;   COLONOSCOPY WITH PROPOFOL N/A 11/17/2019   Procedure: COLONOSCOPY WITH PROPOFOL;  Surgeon: [name], MD;  Location: [facility] ENDOSCOPY;  Service: Gastroenterology;  Laterality: N/A;   COLONOSCOPY WITH PROPOFOL N/A 03/21/2021   Procedure: COLONOSCOPY WITH PROPOFOL;  Surgeon: Lesly Rubenstein, MD;  Location: [facility] ENDOSCOPY;  Service: Endoscopy;  Laterality: N/A;   ESOPHAGOGASTRODUODENOSCOPY (EGD) WITH PROPOFOL N/A 03/21/2021   Procedure: ESOPHAGOGASTRODUODENOSCOPY (EGD) WITH PROPOFOL;  Surgeon: Lesly Rubenstein, MD;  Location: [facility] ENDOSCOPY;  Service: Endoscopy;  Laterality: N/A;   LOBECTOMY  01/2007   Right lung, upper lobe right side removed  TOTAL ABDOMINAL HYSTERECTOMY     abnormal bleeding   TUBAL LIGATION  1980   Family History  Problem Relation Age of Onset   Coronary artery disease Mother        died @ 45 of MI.   Mental illness Mother    Diabetes Mother    Heart attack Father 70       MI   Cancer Father        lung - died in his mid-70's   Hypertension Father    Diabetes Father    Sudden death Brother    Mental illness Brother    Heart attack Brother        MI @ 76, died @ age 9.   Hypertension Child    Breast cancer Neg Hx    Social History   Socioeconomic History   Marital status: Widowed    Spouse name: Not on file   Number of children: Not on file   Years  of education: Not on file   Highest education level: Not on file  Occupational History   Not on file  Tobacco Use   Smoking status: Never    Passive exposure: Yes   Smokeless tobacco: Never   Tobacco comments:    husband and son smoked in her home.  Vaping Use   Vaping Use: Never used  Substance and Sexual Activity   Alcohol use: Not Currently    Alcohol/week: 0.0 standard drinks of alcohol   Drug use: No   Sexual activity: Never  Other Topics Concern   Not on file  Social History Narrative   Widowed.   Disabled (colon/lung CA), retired.   Lives in [place] with her dtr, grand-dtr, and son.   Active.   Gets regular exercise, walks.   Right handed    Social Determinants of Health   Financial Resource Strain: Low Risk  (11/29/2021)   Overall Financial Resource Strain (CARDIA)    Difficulty of Paying Living Expenses: Not hard at all  Food Insecurity: No Food Insecurity (11/29/2021)   Hunger Vital Sign    Worried About Running Out of Food in the Last Year: Never true    Ran Out of Food in the Last Year: Never true  Transportation Needs: No Transportation Needs (11/29/2021)   PRAPARE - Hydrologist (Medical): No    Lack of Transportation (Non-Medical): No  Physical Activity: Not on file  Stress: No Stress Concern Present (11/29/2021)   [place]    Feeling of Stress : Only a little  Social Connections: Unknown (11/29/2021)   Social Connection and Isolation Panel [NHANES]    Frequency of Communication with Friends and Family: More than three times a week    Frequency of Social Gatherings with Friends and Family: More than three times a week    Attends Religious Services: Not on Advertising copywriter or Organizations: Not on file    Attends Archivist Meetings: Not on file    Marital Status: Not on file     Review of Systems  Constitutional:  Negative for  chills and fever.  HENT:  Negative for congestion and sinus pressure.   Respiratory:  Negative for cough and chest tightness.        Breathing stable.   Cardiovascular:  Negative for chest pain and palpitations.  Gastrointestinal:  Negative for abdominal pain, diarrhea, nausea and vomiting.  Genitourinary:  Negative for difficulty urinating and dysuria.  Musculoskeletal:  Negative for myalgias.       Knee issues as outlined.   Skin:  Negative for color change and rash.  Neurological:  Negative for dizziness and headaches.  Psychiatric/Behavioral:  Negative for agitation and dysphoric mood.        Objective:     BP 136/78   Pulse 72   Temp 98.2 F (36.8 C)   Resp 16   Ht 5\' 3"  (1.6 m)   Wt 156 lb (70.8 kg)   SpO2 98%   BMI 27.63 kg/m  Wt Readings from Last 3 Encounters:  08/30/22 156 lb (70.8 kg)  08/02/22 164 lb (74.4 kg)  07/23/22 164 lb 6.4 oz (74.6 kg)    Physical Exam Vitals reviewed.  Constitutional:      General: She is not in acute distress.    Appearance: Normal appearance.  HENT:     Head: Normocephalic and atraumatic.     Right Ear: External ear normal.     Left Ear: External ear normal.  Eyes:     General: No scleral icterus.       Right eye: No discharge.        Left eye: No discharge.     Conjunctiva/sclera: Conjunctivae normal.  Neck:     Thyroid: No thyromegaly.  Cardiovascular:     Rate and Rhythm: Normal rate and regular rhythm.  Pulmonary:     Effort: No respiratory distress.     Breath sounds: Normal breath sounds. No wheezing.  Abdominal:     General: Bowel sounds are normal.     Palpations: Abdomen is soft.     Tenderness: There is no abdominal tenderness.  Musculoskeletal:        General: No swelling or tenderness.     Cervical back: Neck supple. No tenderness.  Lymphadenopathy:     Cervical: No cervical adenopathy.  Skin:    Findings: No erythema or rash.  Neurological:     Mental Status: She is alert.  Psychiatric:         Mood and Affect: Mood normal.        Behavior: Behavior normal.      Outpatient Encounter Medications as of 08/30/2022  Medication Sig   ALPRAZolam (XANAX) 0.5 MG tablet TAKE 1/2 TABLET BY MOUTH ONCE DAILY AS NEEDED   aspirin EC 81 MG tablet Take 81 mg by mouth as needed.    Carbidopa-Levodopa ER (SINEMET CR) 25-100 MG tablet controlled release 2 at 7am/1 at 11am/1 at 4pm and 2 at BED   cholecalciferol (VITAMIN D3) 25 MCG (1000 UNIT) tablet Take 1,000 Units by mouth daily.   citalopram (CELEXA) 10 MG tablet TAKE ONE AND ONE-HALF TABLET BY MOUTH DAILY   fluticasone-salmeterol (ADVAIR HFA) 115-21 MCG/ACT inhaler Inhale 2 puffs into the lungs 2 (two) times daily.   furosemide (LASIX) 20 MG tablet Take 1 tablet (20 mg total) by mouth daily.   HYDROcodone-acetaminophen (NORCO/VICODIN) 5-325 MG tablet Take 1 tablet by mouth every 6 (six) hours as needed for moderate pain.   montelukast (SINGULAIR) 10 MG tablet TAKE 1 TABLET BY MOUTH AT BEDTIME   PROAIR HFA 108 (90 Base) MCG/ACT inhaler INHALE 2 PUFFS BY MOUTH INTO THE LUNGS EVERY 6 HOURS AS NEEDED FOR WHEEZING   rosuvastatin (CRESTOR) 5 MG tablet TAKE 1 TABLET BY MOUTH EVERY MONDAY, WEDNESDAY AND FRIDAY   traZODone (DESYREL) 50 MG tablet TAKE 2 TABLETS BY MOUTH AT BEDTIME   vitamin B-12 (  CYANOCOBALAMIN) 100 MCG tablet Take 100 mcg by mouth daily.   No facility-administered encounter medications on file as of 08/30/2022.     Lab Results  Component Value Date   WBC 10.7 (H) 05/20/2022   HGB 11.9 (L) 05/20/2022   HCT 37.2 05/20/2022   PLT 254 05/20/2022   GLUCOSE 104 (H) 07/05/2022   CHOL 207 (H) 03/09/2022   TRIG 77.0 03/09/2022   HDL 72.60 03/09/2022   LDLDIRECT 163.4 12/31/2012   LDLCALC 119 (H) 03/09/2022   ALT 18 05/20/2022   AST 23 05/20/2022   NA 140 07/05/2022   K 3.7 07/05/2022   CL 106 07/05/2022   CREATININE 0.98 07/05/2022   BUN 17 07/05/2022   CO2 26 07/05/2022   TSH 1.67 03/09/2022   INR 1.0 12/12/2012   HGBA1C 5.9  03/09/2022    US Venous Img Lower Unilateral Left  Result Date: 05/20/2022 CLINICAL DATA:  Pain and swelling of the left leg EXAM: Left LOWER EXTREMITY VENOUS DOPPLER ULTRASOUND TECHNIQUE: Gray-scale sonography with compression, as well as color and duplex ultrasound, were performed to evaluate the deep venous system(s) from the level of the common femoral vein through the popliteal and proximal calf veins. COMPARISON:  None Available. FINDINGS: VENOUS Normal compressibility of the common femoral, superficial femoral, and popliteal veins, as well as the visualized calf veins. Visualized portions of profunda femoral vein and great saphenous vein unremarkable. No filling defects to suggest DVT on grayscale or color Doppler imaging. Doppler waveforms show normal direction of venous flow, normal respiratory plasticity and response to augmentation. Limited views of the contralateral common femoral vein are unremarkable. OTHER None. Limitations: none IMPRESSION: Negative. Electronically Signed   By: Nelson Chimes M.D.   On: 05/20/2022 14:24   CT HEAD WO CONTRAST (5MM)  Result Date: 05/20/2022 CLINICAL DATA:  Neuro deficit, acute, stroke suspected EXAM: CT HEAD WITHOUT CONTRAST TECHNIQUE: Contiguous axial images were obtained from the base of the skull through the vertex without intravenous contrast. RADIATION DOSE REDUCTION: This exam was performed according to the departmental dose-optimization program which includes automated exposure control, adjustment of the mA and/or kV according to patient size and/or use of iterative reconstruction technique. COMPARISON:  11/05/2021. FINDINGS: Brain: No evidence of acute infarction, hemorrhage, hydrocephalus, extra-axial collection or mass lesion/mass effect. Prominence of the ventricles, sulci and cisterns noted consistent with age-related involutional changes. Vascular: No hyperdense vessel or unexpected calcification. Skull: Normal. Negative for fracture or focal  lesion. Sinuses/Orbits: No acute finding. IMPRESSION: No acute intracranial process. Electronically Signed   By: Sammie Bench M.D.   On: 05/20/2022 13:37   DG Chest Portable 1 View  Result Date: 05/20/2022 CLINICAL DATA:  weakness, leg swelling, hx CHF EXAM: PORTABLE CHEST - 1 VIEW COMPARISON:  11/20/2021 FINDINGS: Cardiac silhouette is unremarkable. No pneumothorax or pleural effusion. The lungs are clear. The visualized skeletal structures are unremarkable. IMPRESSION: No acute cardiopulmonary process. Electronically Signed   By: Sammie Bench M.D.   On: 05/20/2022 12:55       Assessment & Plan:  Gastroesophageal reflux disease without esophagitis Assessment & Plan: Upper symptoms appear to be controlled on prilosec.  Follow.    Anemia, unspecified type Assessment & Plan: Follow cbc.    Anxiety, mild Assessment & Plan: Continue celexa.  Follow. Doing better.  Feels better.    Aortic atherosclerosis (HCC) Assessment & Plan: Crestor.      Bilateral carotid artery disease, unspecified type ([place]) Assessment & Plan: Previously evaluated by [facility].  Continue crestor.  Stage 3 chronic kidney disease, unspecified whether stage 3a or 3b CKD ([place]) Assessment & Plan: Avoid antiinflammatories.  Off micardis due to low blood pressure.  Reports blood pressure varying.  Blood pressure ok today.  She is varying the timing of her sinemet dosing.  Feels this helps. Follow pressures. Follow metabolic panel.    Depression, recurrent ([place]) Assessment & Plan: Continue citalopram.   Follow. Doing better.    Elevated liver function tests Assessment & Plan: Follow liver function tests.    Fibromuscular dysplasia ([place]) Assessment & Plan: Has been evaluated by [facility].  Continue risk factor modificaiton.    Hepatic steatosis Assessment & Plan: Diet and exercise.  Follow liver panel. Seen on chest CT angio - 09/11/21   History of colon cancer Assessment & Plan: Recent attempt  at colonoscopy - poor prep.   Fiber and probiotics to help with GI symptoms. GI recommended follow cbc and iron studies.    History of malignant neoplasm metastatic to lung Assessment & Plan: S/p lobectomy.  Followed by pulmonary.  Breathing stable.    History of total left knee replacement Assessment & Plan: S/p left TKA.  Has been followed by [facility]. Persistent increased pain.  Request to see Dr Alluisio.  Appt 09/06/22.    Hypercholesterolemia Assessment & Plan: Crestor.  Low cholesterol diet and exercise. Follow lipid panel and liver function tests.    Hyperglycemia Assessment & Plan: Low carb diet and exercise.  Follow met b and a1c.    Parkinson's disease, unspecified whether dyskinesia present, unspecified whether manifestations fluctuate Assessment & Plan: Seeing Dr Tat.  On sinemet - taking as outlined.  Doing better.  Feels better.  Has been working with therapy.    Unsteady gait Assessment & Plan: Overall doing better.  Continue f/u with Dr Tat - continues on sinemet as outlined.    Chronic obstructive pulmonary disease, unspecified COPD type ([place])  Continue advair.  Has proair if needed.   Einar Pheasant, MD

## 2022-09-06 DIAGNOSIS — Z96652 Presence of left artificial knee joint: Secondary | ICD-10-CM | POA: Diagnosis not present

## 2022-09-07 ENCOUNTER — Other Ambulatory Visit (HOSPITAL_COMMUNITY): Payer: Self-pay | Admitting: Orthopedic Surgery

## 2022-09-07 DIAGNOSIS — Z96652 Presence of left artificial knee joint: Secondary | ICD-10-CM

## 2022-09-09 ENCOUNTER — Encounter: Payer: Self-pay | Admitting: Internal Medicine

## 2022-09-09 NOTE — Assessment & Plan Note (Signed)
S/p left TKA.  Has been followed by [facility]. Persistent increased pain.  Request to see Dr Alluisio.  Appt 09/06/22.  

## 2022-09-09 NOTE — Assessment & Plan Note (Addendum)
Diet and exercise.  Follow liver panel. Seen on chest CT angio - 09/11/21

## 2022-09-09 NOTE — Assessment & Plan Note (Signed)
Low carb diet and exercise.  Follow met b and a1c.   

## 2022-09-09 NOTE — Assessment & Plan Note (Signed)
Upper symptoms appear to be controlled on prilosec.  Follow.  

## 2022-09-09 NOTE — Assessment & Plan Note (Signed)
S/p lobectomy.  Followed by pulmonary.  Breathing stable.  

## 2022-09-09 NOTE — Assessment & Plan Note (Signed)
Continue celexa.  Follow. Doing better.  Feels better.  

## 2022-09-09 NOTE — Assessment & Plan Note (Signed)
-   Crestor 

## 2022-09-09 NOTE — Assessment & Plan Note (Signed)
Follow cbc.  

## 2022-09-09 NOTE — Assessment & Plan Note (Signed)
Avoid antiinflammatories.  Off micardis due to low blood pressure.  Reports blood pressure varying.  Blood pressure ok today.  She is varying the timing of her sinemet dosing.  Feels this helps. Follow pressures. Follow metabolic panel.

## 2022-09-09 NOTE — Assessment & Plan Note (Signed)
Crestor. Low cholesterol diet and exercise.  Follow lipid panel and liver function tests.  

## 2022-09-09 NOTE — Assessment & Plan Note (Signed)
Continue citalopram.   Follow. Doing better.  

## 2022-09-09 NOTE — Assessment & Plan Note (Signed)
Recent attempt at colonoscopy - poor prep.   Fiber and probiotics to help with GI symptoms. GI recommended follow cbc and iron studies.  

## 2022-09-09 NOTE — Assessment & Plan Note (Signed)
Seeing Dr Tat.  On sinemet - taking as outlined.  Doing better.  Feels better.  Has been working with therapy.

## 2022-09-09 NOTE — Assessment & Plan Note (Signed)
Follow liver function tests.   

## 2022-09-09 NOTE — Assessment & Plan Note (Signed)
Previously evaluated by [facility].  Continue crestor.   

## 2022-09-09 NOTE — Assessment & Plan Note (Signed)
Has been evaluated by [facility].  Continue risk factor modificaiton.  

## 2022-09-09 NOTE — Assessment & Plan Note (Signed)
Overall doing better.  Continue f/u with Dr Tat - continues on sinemet as outlined.

## 2022-09-10 ENCOUNTER — Other Ambulatory Visit: Payer: Self-pay | Admitting: Internal Medicine

## 2022-09-10 ENCOUNTER — Telehealth: Payer: Self-pay

## 2022-09-10 NOTE — Patient Outreach (Signed)
  Care Coordination   Initial Visit Note   09/10/2022 Name: REYLENE SATRIANO MRN: [id number] DOB: [birth year]  CHARITEE BROWNE is a 72 y.o. year old female who sees Einar Pheasant, MD for primary care. I spoke with  [name] by phone today.  What matters to the patients health and wellness today?  Patient denies having any nursing or community resource needs.      Goals Addressed             This Visit's Progress    care coordination activities - no further follow up needed       Interventions Today    Flowsheet Row Most Recent Value  General Interventions   General Interventions Discussed/Reviewed General Interventions Discussed  [Care coordination program / services discussed. Social determinants of health survey completed. AWV discussed and patient advised to contact provider office to schedule. Advised to contact [facility] office if care coordination services needed.]              SDOH assessments and interventions completed:  Yes  SDOH Interventions Today    Flowsheet Row Most Recent Value  SDOH Interventions   Food Insecurity Interventions Intervention Not Indicated  Housing Interventions Intervention Not Indicated  Transportation Interventions Intervention Not Indicated        Care Coordination Interventions:  Yes, provided   Follow up plan: No further intervention required.   Encounter Outcome:  Pt. Visit Completed   Quinn Plowman RN,BSN,CCM [place] [phone] direct line

## 2022-09-11 ENCOUNTER — Encounter: Payer: Self-pay | Admitting: Internal Medicine

## 2022-09-12 NOTE — Telephone Encounter (Signed)
Rx ok'd for xanax #30 with no refills.  Reviewed PDMP

## 2022-09-14 ENCOUNTER — Telehealth: Payer: Self-pay | Admitting: Neurology

## 2022-09-14 NOTE — Telephone Encounter (Signed)
Pt called in stating her legs are starting to jump. She is wondering if she should increase her sinemet?

## 2022-09-14 NOTE — Telephone Encounter (Signed)
Called and spoke with patient and daughter patient understood waiting to increase medication at this time since it had just been increased

## 2022-09-19 ENCOUNTER — Encounter
Admission: RE | Admit: 2022-09-19 | Discharge: 2022-09-19 | Disposition: A | Discharge status: Home / Self Care | Payer: 59 | Source: Ambulatory Visit | Attending: Orthopedic Surgery | Admitting: Orthopedic Surgery

## 2022-09-19 DIAGNOSIS — Z96652 Presence of left artificial knee joint: Secondary | ICD-10-CM | POA: Diagnosis not present

## 2022-09-19 MED ORDER — TECHNETIUM TC 99M MEDRONATE IV KIT
20.0000 | PACK | Freq: Once | INTRAVENOUS | Status: AC | PRN
  Administered 2022-09-19: 21.03 via INTRAVENOUS

## 2022-09-27 ENCOUNTER — Ambulatory Visit (INDEPENDENT_AMBULATORY_CARE_PROVIDER_SITE_OTHER): Payer: 59 | Admitting: Internal Medicine

## 2022-09-27 ENCOUNTER — Encounter: Payer: Self-pay | Admitting: Internal Medicine

## 2022-09-27 VITALS — BP 130/70 | HR 66 | Temp 97.6°F | Resp 18 | Ht 63.0 in | Wt 161.8 lb

## 2022-09-27 DIAGNOSIS — G20A1 Parkinson's disease without dyskinesia, without mention of fluctuations: Secondary | ICD-10-CM

## 2022-09-27 DIAGNOSIS — N183 Chronic kidney disease, stage 3 unspecified: Secondary | ICD-10-CM | POA: Diagnosis not present

## 2022-09-27 DIAGNOSIS — I779 Disorder of arteries and arterioles, unspecified: Secondary | ICD-10-CM | POA: Diagnosis not present

## 2022-09-27 DIAGNOSIS — Z85038 Personal history of other malignant neoplasm of large intestine: Secondary | ICD-10-CM | POA: Diagnosis not present

## 2022-09-27 DIAGNOSIS — R7989 Other specified abnormal findings of blood chemistry: Secondary | ICD-10-CM

## 2022-09-27 DIAGNOSIS — Z8589 Personal history of malignant neoplasm of other organs and systems: Secondary | ICD-10-CM

## 2022-09-27 DIAGNOSIS — F419 Anxiety disorder, unspecified: Secondary | ICD-10-CM

## 2022-09-27 DIAGNOSIS — K760 Fatty (change of) liver, not elsewhere classified: Secondary | ICD-10-CM | POA: Diagnosis not present

## 2022-09-27 DIAGNOSIS — F339 Major depressive disorder, recurrent, unspecified: Secondary | ICD-10-CM

## 2022-09-27 DIAGNOSIS — K219 Gastro-esophageal reflux disease without esophagitis: Secondary | ICD-10-CM

## 2022-09-27 DIAGNOSIS — I700 Atherosclerosis of aorta: Secondary | ICD-10-CM

## 2022-09-27 DIAGNOSIS — Z96652 Presence of left artificial knee joint: Secondary | ICD-10-CM

## 2022-09-27 DIAGNOSIS — I10 Essential (primary) hypertension: Secondary | ICD-10-CM | POA: Diagnosis not present

## 2022-09-27 DIAGNOSIS — I773 Arterial fibromuscular dysplasia: Secondary | ICD-10-CM

## 2022-09-27 DIAGNOSIS — E7800 Pure hypercholesterolemia, unspecified: Secondary | ICD-10-CM

## 2022-09-27 DIAGNOSIS — R739 Hyperglycemia, unspecified: Secondary | ICD-10-CM

## 2022-09-27 DIAGNOSIS — D649 Anemia, unspecified: Secondary | ICD-10-CM | POA: Diagnosis not present

## 2022-09-27 DIAGNOSIS — M810 Age-related osteoporosis without current pathological fracture: Secondary | ICD-10-CM

## 2022-09-27 NOTE — Progress Notes (Signed)
Subjective:    Patient ID: Victoria Hanson, female    DOB: Feb 20, 1951, 72 y.o.   MRN: [id number]  Patient here for  Chief Complaint  Patient presents with   Follow-up    F/u from left leg surgery & parkinson's. Also states that BP has been running high at night.    HPI Here for follow up appt - f/u regarding her blood pressure and parkinsons.  Seeing Dr Tat for f/u of parkinsons.  Doing much better. Getting around better.  Able to move around more at home.  Tolerating sinemet regimen.  No chest pain reported.  Breathing stable.  No increased cough or congestion.  No abdominal pain or bowel change reported.  Taking celexa - .  Feels this dose is working well.    Past Medical History:  Diagnosis Date   Anxiety    Asthma    Atypical chest pain    a. 2003 Cath: reportedly nl per pt [name]);  b. 04/2015 [facility] Admission, neg troponin.   Colon cancer 1999   a. 1999: Pt had partial colectomy. Did develop lung metastasis. Had right upper lobectomy in 01/2007 then had associated chemotherapy.   COPD (chronic obstructive pulmonary disease)    DDD (degenerative disc disease), lumbosacral    Depression    Echocardiogram abnormal    a. 02/2010 Echo: EF 55-60%, mild LAE, no regional wall motion abnormalities   Elevated transaminase level    a. ? NASH;  b. 06/2015 - nl LFTs.   GERD (gastroesophageal reflux disease)    Hematuria    Holter monitor, abnormal    a. 12/2009 Holter monitoring: NSR, rare PACs and PVCs.   Hypercholesterolemia    Hyperlipidemia    Hypertension    Leaky heart valve    a. Per pt report - no evidence of valvular abnormalities on prior echoes.   Lesion of skin of Right breast 10/20/2017   Lung metastases 2008   a. S/P resection (lobectomy - right)   Morbid obesity    Nephrolithiasis    pt denies   Parkinson disease 08/2016   [name]   Past Surgical History:  Procedure Laterality Date   BREAST EXCISIONAL BIOPSY Right 10/07/2009   benign   CARDIAC  CATHETERIZATION  2003   negative as per pt report   CHOLECYSTECTOMY  2000   COLON SURGERY     Colon cancer, removed part of large colon   COLONOSCOPY N/A 04/25/2015   Procedure: COLONOSCOPY;  Surgeon: Scot Jun, MD;  Location: [facility] ENDOSCOPY;  Service: Endoscopy;  Laterality: N/A;   COLONOSCOPY WITH PROPOFOL N/A 06/14/2017   Procedure: COLONOSCOPY WITH PROPOFOL;  Surgeon: Scot Jun, MD;  Location: [facility] ENDOSCOPY;  Service: Endoscopy;  Laterality: N/A;   COLONOSCOPY WITH PROPOFOL N/A 11/17/2019   Procedure: COLONOSCOPY WITH PROPOFOL;  Surgeon: Wyline Mood, MD;  Location: [facility] ENDOSCOPY;  Service: Gastroenterology;  Laterality: N/A;   COLONOSCOPY WITH PROPOFOL N/A 03/21/2021   Procedure: COLONOSCOPY WITH PROPOFOL;  Surgeon: Regis Bill, MD;  Location: [facility] ENDOSCOPY;  Service: Endoscopy;  Laterality: N/A;   ESOPHAGOGASTRODUODENOSCOPY (EGD) WITH PROPOFOL N/A 03/21/2021   Procedure: ESOPHAGOGASTRODUODENOSCOPY (EGD) WITH PROPOFOL;  Surgeon: Regis Bill, MD;  Location: [facility] ENDOSCOPY;  Service: Endoscopy;  Laterality: N/A;   LOBECTOMY  01/2007   Right lung, upper lobe right side removed   TOTAL ABDOMINAL HYSTERECTOMY     abnormal bleeding   TUBAL LIGATION  1980   Family History  Problem Relation Age of Onset   Coronary  artery disease Mother        died @ 53 of MI.   Mental illness Mother    Diabetes Mother    Heart attack Father 73       MI   Cancer Father        lung - died in his mid-70's   Hypertension Father    Diabetes Father    Sudden death Brother    Mental illness Brother    Heart attack Brother        MI @ 51, died @ age 29.   Hypertension Child    Breast cancer Neg Hx    Social History   Socioeconomic History   Marital status: Widowed    Spouse name: Not on file   Number of children: Not on file   Years of education: Not on file   Highest education level: Not on file  Occupational History   Not on file  Tobacco Use   Smoking status:  Never    Passive exposure: Yes   Smokeless tobacco: Never   Tobacco comments:    husband and son smoked in her home.  Vaping Use   Vaping Use: Never used  Substance and Sexual Activity   Alcohol use: Not Currently    Alcohol/week: 0.0 standard drinks of alcohol   Drug use: No   Sexual activity: Never  Other Topics Concern   Not on file  Social History Narrative   Widowed.   Disabled (colon/lung CA), retired.   Lives in [place] with her dtr, grand-dtr, and son.   Active.   Gets regular exercise, walks.   Right handed    Social Determinants of Health   Financial Resource Strain: Low Risk  (11/29/2021)   Overall Financial Resource Strain (CARDIA)    Difficulty of Paying Living Expenses: Not hard at all  Food Insecurity: No Food Insecurity (09/10/2022)   Hunger Vital Sign    Worried About Running Out of Food in the Last Year: Never true    Ran Out of Food in the Last Year: Never true  Transportation Needs: No Transportation Needs (09/10/2022)   PRAPARE - Administrator, Civil Service (Medical): No    Lack of Transportation (Non-Medical): No  Physical Activity: Not on file  Stress: No Stress Concern Present (11/29/2021)   [employer] of Occupational Health - Occupational Stress Questionnaire    Feeling of Stress : Only a little  Social Connections: Unknown (11/29/2021)   Social Connection and Isolation Panel [NHANES]    Frequency of Communication with Friends and Family: More than three times a week    Frequency of Social Gatherings with Friends and Family: More than three times a week    Attends Religious Services: Not on Marketing executive or Organizations: Not on file    Attends Banker Meetings: Not on file    Marital Status: Not on file     Review of Systems  Constitutional:  Negative for appetite change and unexpected weight change.  HENT:  Negative for congestion and sinus pressure.   Respiratory:  Negative for cough and  chest tightness.        Breathing stable.   Cardiovascular:  Negative for chest pain and palpitations.  Gastrointestinal:  Negative for abdominal pain, diarrhea, nausea and vomiting.  Genitourinary:  Negative for difficulty urinating and dysuria.  Musculoskeletal:  Negative for joint swelling and myalgias.  Skin:  Negative for color change and rash.  Neurological:  Negative for dizziness and headaches.  Psychiatric/Behavioral:  Negative for agitation and dysphoric mood.        Objective:     BP 130/70   Pulse 66   Temp 97.6 F (36.4 C) (Oral)   Resp 18   Ht  (1.6 m)   Wt 161 lb 12 oz (73.4 kg)   SpO2 99%   BMI 28.65 kg/m  Wt Readings from Last 3 Encounters:  09/27/22 161 lb 12 oz (73.4 kg)  08/30/22 156 lb (70.8 kg)  08/02/22 164 lb (74.4 kg)    Physical Exam Vitals reviewed.  Constitutional:      General: She is not in acute distress.    Appearance: Normal appearance.  HENT:     Head: Normocephalic and atraumatic.     Right Ear: External ear normal.     Left Ear: External ear normal.  Eyes:     General: No scleral icterus.       Right eye: No discharge.        Left eye: No discharge.     Conjunctiva/sclera: Conjunctivae normal.  Neck:     Thyroid: No thyromegaly.  Cardiovascular:     Rate and Rhythm: Normal rate and regular rhythm.  Pulmonary:     Effort: No respiratory distress.     Breath sounds: Normal breath sounds. No wheezing.  Abdominal:     General: Bowel sounds are normal.     Palpations: Abdomen is soft.     Tenderness: There is no abdominal tenderness.  Musculoskeletal:        General: No swelling or tenderness.     Cervical back: Neck supple. No tenderness.  Lymphadenopathy:     Cervical: No cervical adenopathy.  Skin:    Findings: No erythema or rash.  Neurological:     Mental Status: She is alert.  Psychiatric:        Mood and Affect: Mood normal.        Behavior: Behavior normal.      Outpatient Encounter Medications as of  09/27/2022  Medication Sig   ALPRAZolam (XANAX) 0.5 MG tablet TAKE 1/2 TABLET BY MOUTH ONCE DAILY AS NEEDED   aspirin EC 81 MG tablet Take 81 mg by mouth as needed.    Carbidopa-Levodopa ER (SINEMET CR) 25-100 MG tablet controlled release 2 at 7am/1 at 11am/1 at 4pm and 2 at BED   cholecalciferol (VITAMIN D3) 25 MCG (1000 UNIT) tablet Take 1,000 Units by mouth daily.   fluticasone-salmeterol (ADVAIR HFA) 115-21 MCG/ACT inhaler Inhale 2 puffs into the lungs 2 (two) times daily.   furosemide (LASIX) 20 MG tablet Take 1 tablet (20 mg total) by mouth daily.   montelukast (SINGULAIR) 10 MG tablet TAKE 1 TABLET BY MOUTH AT BEDTIME   PROAIR HFA 108 (90 Base) MCG/ACT inhaler INHALE 2 PUFFS BY MOUTH INTO THE LUNGS EVERY 6 HOURS AS NEEDED FOR WHEEZING   traZODone (DESYREL) 50 MG tablet TAKE 2 TABLETS BY MOUTH AT BEDTIME   vitamin B-12 (CYANOCOBALAMIN) 100 MCG tablet Take 100 mcg by mouth daily.   [DISCONTINUED] citalopram (CELEXA) 10 MG tablet TAKE ONE AND ONE-HALF TABLET BY MOUTH DAILY   citalopram (CELEXA) 10 MG tablet Take 1 tablet (10 mg total) by mouth daily.   rosuvastatin (CRESTOR) 5 MG tablet TAKE 1 TABLET BY MOUTH EVERY MONDAY, WEDNESDAY AND FRIDAY (Patient not taking: Reported on 09/27/2022)   [DISCONTINUED] HYDROcodone-acetaminophen (NORCO/VICODIN) 5-325 MG tablet Take 1 tablet by mouth every 6 (six) hours as  needed for moderate pain.   No facility-administered encounter medications on file as of 09/27/2022.     Lab Results  Component Value Date   WBC 10.7 (H) 05/20/2022   HGB 11.9 (L) 05/20/2022   HCT 37.2 05/20/2022   PLT 254 05/20/2022   GLUCOSE 104 (H) 07/05/2022   CHOL 207 (H) 03/09/2022   TRIG 77.0 03/09/2022   HDL 72.60 03/09/2022   LDLDIRECT 163.4 12/31/2012   LDLCALC 119 (H) 03/09/2022   ALT 18 05/20/2022   AST 23 05/20/2022   NA 140 07/05/2022   K 3.7 07/05/2022   CL 106 07/05/2022   CREATININE 0.98 07/05/2022   BUN 17 07/05/2022   CO2 26 07/05/2022   TSH 1.67  03/09/2022   INR 1.0 12/12/2012   HGBA1C 5.9 03/09/2022    NM Bone Scan 3 Phase  Result Date: 09/21/2022 CLINICAL DATA:  LEFT knee arthroplasty EXAM: NUCLEAR MEDICINE 3-PHASE BONE SCAN TECHNIQUE: Radionuclide angiographic images, immediate static blood pool images, and 3-hour delayed static images were obtained of the knees after intravenous injection of radiopharmaceutical. RADIOPHARMACEUTICALS:  21.0 mCi Tc-106m MDP IV COMPARISON:  None Available. FINDINGS: Vascular phase: No asymmetric or increased blood flow to the LEFT or RIGHT knee. Blood pool phase: Mild asymmetric blood pool activity surrounding the LEFT knee joint. Delayed phase: Focal radiotracer activity accumulates within the LEFT patella. No abnormal radiotracer accumulation within the LEFT tibial plateau or femoral condyles. Photopenia noted associated with the LEFT knee prosthetic. Mild degenerative uptake in the medial LEFT knee compartment. IMPRESSION: 1. No evidence of loosening infection of the LEFT knee prosthetic. 2. Focal delayed uptake in the LEFT patella. Electronically Signed   By: Genevive Bi M.D.   On: 09/21/2022 10:57       Assessment & Plan:  Primary hypertension Assessment & Plan: Varying pressures.  Hold on additional medication.  Off amlodipine. Lasix.   Follow metabolic panel.  Follow pressures.    Orders: -     Basic metabolic panel; Future  Hypercholesterolemia Assessment & Plan: Need to confirm if taking crestor.   Low cholesterol diet and exercise. Follow lipid panel and liver function tests.   Orders: -     Lipid panel; Future -     Hepatic function panel; Future  Gastroesophageal reflux disease without esophagitis Assessment & Plan: Upper symptoms appear to be controlled on prilosec.  Follow.    Anemia, unspecified type Assessment & Plan: Follow cbc.   Orders: -     CBC with Differential/Platelet; Future -     IBC + Ferritin; Future  Anxiety, mild Assessment & Plan: Continue celexa.   Follow. Doing better.  Feels better.    Aortic atherosclerosis Assessment & Plan: Crestor.      Bilateral carotid artery disease, unspecified type Assessment & Plan: Previously evaluated by [facility].  Needs to continue crestor.     Stage 3 chronic kidney disease, unspecified whether stage 3a or 3b CKD Assessment & Plan: Avoid antiinflammatories.  Off micardis due to low blood pressure.  Reports blood pressure varying.  Blood pressure ok today.  Follow pressures. Follow metabolic panel.    Depression, recurrent Assessment & Plan: Continue citalopram.   Follow. Doing better.    Elevated liver function tests Assessment & Plan: Follow liver function tests.    Fibromuscular dysplasia Assessment & Plan: Has been evaluated by [facility].  Continue risk factor modificaiton.    Hepatic steatosis Assessment & Plan: Diet and exercise.  Follow liver panel. Seen on chest CT angio -  09/11/21   History of colon cancer Assessment & Plan: Recent attempt at colonoscopy - poor prep.   Fiber and probiotics to help with GI symptoms. GI recommended follow cbc and iron studies.    History of malignant neoplasm metastatic to lung Assessment & Plan: S/p lobectomy.  Followed by pulmonary.  Breathing stable.    History of total left knee replacement Assessment & Plan: S/p left TKA.  Has been followed by [facility]. Persistent increased pain.  Requested to see Dr Merita Norton.  Just evaluated.  Obtain records.     Hyperglycemia Assessment & Plan: Low carb diet and exercise.  Follow met b and a1c.   Orders: -     Hemoglobin A1c; Future  Parkinson's disease, unspecified whether dyskinesia present, unspecified whether manifestations fluctuate Assessment & Plan: Seeing Dr Tat.  On sinemet - taking as outlined.  Doing better.  Feels better.  Has been working with therapy.    Osteoporosis without current pathological fracture, unspecified osteoporosis type Assessment & Plan: Needs f/u bone density.     Other orders -     Citalopram Hydrobromide; Take 1 tablet (10 mg total) by mouth daily.  Dispense: 90 tablet; Refill: 0     Dale T[place], MD

## 2022-09-30 ENCOUNTER — Encounter: Payer: Self-pay | Admitting: Internal Medicine

## 2022-09-30 MED ORDER — CITALOPRAM HYDROBROMIDE 10 MG PO TABS: 10.0000 mg | ORAL_TABLET | Freq: Every day | ORAL | 0 refills | Status: DC

## 2022-09-30 NOTE — Assessment & Plan Note (Signed)
S/p left TKA.  Has been followed by [facility]. Persistent increased pain.  Requested to see Dr Merita Norton.  Just evaluated.  Obtain records.

## 2022-09-30 NOTE — Assessment & Plan Note (Signed)
-   Crestor 

## 2022-09-30 NOTE — Assessment & Plan Note (Signed)
Previously evaluated by [facility].  Needs to continue crestor.

## 2022-09-30 NOTE — Assessment & Plan Note (Signed)
Has been evaluated by [facility].  Continue risk factor modificaiton.  

## 2022-09-30 NOTE — Assessment & Plan Note (Signed)
Follow cbc.  

## 2022-09-30 NOTE — Assessment & Plan Note (Signed)
Continue citalopram.   Follow. Doing better.  

## 2022-09-30 NOTE — Assessment & Plan Note (Signed)
Recent attempt at colonoscopy - poor prep.   Fiber and probiotics to help with GI symptoms. GI recommended follow cbc and iron studies.  

## 2022-09-30 NOTE — Assessment & Plan Note (Signed)
Low carb diet and exercise.  Follow met b and a1c.   

## 2022-09-30 NOTE — Assessment & Plan Note (Signed)
Diet and exercise.  Follow liver panel. Seen on chest CT angio - 09/11/21 

## 2022-09-30 NOTE — Assessment & Plan Note (Signed)
Upper symptoms appear to be controlled on prilosec.  Follow.  

## 2022-09-30 NOTE — Assessment & Plan Note (Signed)
Seeing Dr Tat.  On sinemet - taking as outlined.  Doing better.  Feels better.  Has been working with therapy.  

## 2022-09-30 NOTE — Assessment & Plan Note (Signed)
Continue celexa.  Follow. Doing better.  Feels better.  

## 2022-09-30 NOTE — Assessment & Plan Note (Signed)
Avoid antiinflammatories.  Off micardis due to low blood pressure.  Reports blood pressure varying.  Blood pressure ok today.  Follow pressures. Follow metabolic panel.

## 2022-09-30 NOTE — Assessment & Plan Note (Signed)
S/p lobectomy.  Followed by pulmonary.  Breathing stable.  

## 2022-09-30 NOTE — Assessment & Plan Note (Signed)
Varying pressures.  Hold on additional medication.  Off amlodipine. Lasix.   Follow metabolic panel.  Follow pressures.

## 2022-09-30 NOTE — Assessment & Plan Note (Signed)
Needs f/u bone density.   

## 2022-09-30 NOTE — Assessment & Plan Note (Addendum)
Need to confirm if taking crestor.   Low cholesterol diet and exercise. Follow lipid panel and liver function tests.

## 2022-09-30 NOTE — Assessment & Plan Note (Signed)
Follow liver function tests.   

## 2022-10-05 ENCOUNTER — Encounter: Payer: Self-pay | Admitting: Nurse Practitioner

## 2022-10-05 ENCOUNTER — Ambulatory Visit (INDEPENDENT_AMBULATORY_CARE_PROVIDER_SITE_OTHER): Payer: 59 | Admitting: Nurse Practitioner

## 2022-10-05 VITALS — BP 116/70 | HR 65 | Temp 97.5°F | Ht 63.0 in | Wt 157.6 lb

## 2022-10-05 DIAGNOSIS — J029 Acute pharyngitis, unspecified: Secondary | ICD-10-CM

## 2022-10-05 LAB — POCT RAPID STREP A (OFFICE): Rapid Strep A Screen: NEGATIVE

## 2022-10-05 LAB — POCT INFLUENZA A/B
Influenza A, POC: NEGATIVE
Influenza B, POC: NEGATIVE

## 2022-10-05 MED ORDER — AZITHROMYCIN 250 MG PO TABS
ORAL_TABLET | ORAL | 0 refills | Status: AC
Start: 2022-10-05 — End: 2022-10-10

## 2022-10-05 NOTE — Progress Notes (Signed)
Established Patient Office Visit  Subjective:  Patient ID: Victoria Hanson, female    DOB: [birth year]  Age: 72 y.o. MRN: [id number]  CC:  Chief Complaint  Patient presents with   Sore Throat    cough    HPI  Victoria Hanson presents for sore throat, cough and subjective fever. The sore throat started 2 days ago and worse last night.  She took tylenol for sorethroat and Robitussin for cough.  HPI   Past Medical History:  Diagnosis Date   Anxiety    Asthma    Atypical chest pain    a. 2003 Cath: reportedly nl per pt [name]);  b. 04/2015 [facility] Admission, neg troponin.   Colon cancer (HCC) 1999   a. 1999: Pt had partial colectomy. Did develop lung metastasis. Had right upper lobectomy in 01/2007 then had associated chemotherapy.   COPD (chronic obstructive pulmonary disease) (HCC)    DDD (degenerative disc disease), lumbosacral    Depression    Echocardiogram abnormal    a. 02/2010 Echo: EF 55-60%, mild LAE, no regional wall motion abnormalities   Elevated transaminase level    a. ? NASH;  b. 06/2015 - nl LFTs.   GERD (gastroesophageal reflux disease)    Hematuria    Holter monitor, abnormal    a. 12/2009 Holter monitoring: NSR, rare PACs and PVCs.   Hypercholesterolemia    Hyperlipidemia    Hypertension    Leaky heart valve    a. Per pt report - no evidence of valvular abnormalities on prior echoes.   Lesion of skin of Right breast 10/20/2017   Lung metastases 2008   a. S/P resection (lobectomy - right)   Morbid obesity (HCC)    Nephrolithiasis    pt denies   Parkinson disease 08/2016   [name]    Past Surgical History:  Procedure Laterality Date   BREAST EXCISIONAL BIOPSY Right 10/07/2009   benign   CARDIAC CATHETERIZATION  2003   negative as per pt report   CHOLECYSTECTOMY  2000   COLON SURGERY     Colon cancer, removed part of large colon   COLONOSCOPY N/A 04/25/2015   Procedure: COLONOSCOPY;  Surgeon: Scot Jun, MD;  Location: [facility] ENDOSCOPY;   Service: Endoscopy;  Laterality: N/A;   COLONOSCOPY WITH PROPOFOL N/A 06/14/2017   Procedure: COLONOSCOPY WITH PROPOFOL;  Surgeon: Scot Jun, MD;  Location: [facility] ENDOSCOPY;  Service: Endoscopy;  Laterality: N/A;   COLONOSCOPY WITH PROPOFOL N/A 11/17/2019   Procedure: COLONOSCOPY WITH PROPOFOL;  Surgeon: Wyline Mood, MD;  Location: [facility] ENDOSCOPY;  Service: Gastroenterology;  Laterality: N/A;   COLONOSCOPY WITH PROPOFOL N/A 03/21/2021   Procedure: COLONOSCOPY WITH PROPOFOL;  Surgeon: Regis Bill, MD;  Location: [facility] ENDOSCOPY;  Service: Endoscopy;  Laterality: N/A;   ESOPHAGOGASTRODUODENOSCOPY (EGD) WITH PROPOFOL N/A 03/21/2021   Procedure: ESOPHAGOGASTRODUODENOSCOPY (EGD) WITH PROPOFOL;  Surgeon: Regis Bill, MD;  Location: [facility] ENDOSCOPY;  Service: Endoscopy;  Laterality: N/A;   LOBECTOMY  01/2007   Right lung, upper lobe right side removed   TOTAL ABDOMINAL HYSTERECTOMY     abnormal bleeding   TUBAL LIGATION  1980    Family History  Problem Relation Age of Onset   Coronary artery disease Mother        died @ 54 of MI.   Mental illness Mother    Diabetes Mother    Heart attack Father 63       MI   Cancer Father  lung - died in his mid-70's   Hypertension Father    Diabetes Father    Sudden death Brother    Mental illness Brother    Heart attack Brother        MI @ 11, died @ age 30.   Hypertension Child    Breast cancer Neg Hx     Social History   Socioeconomic History   Marital status: Widowed    Spouse name: Not on file   Number of children: Not on file   Years of education: Not on file   Highest education level: Not on file  Occupational History   Not on file  Tobacco Use   Smoking status: Never    Passive exposure: Yes   Smokeless tobacco: Never   Tobacco comments:    husband and son smoked in her home.  Vaping Use   Vaping Use: Never used  Substance and Sexual Activity   Alcohol use: Not Currently    Alcohol/week: 0.0 standard  drinks of alcohol   Drug use: No   Sexual activity: Never  Other Topics Concern   Not on file  Social History Narrative   Widowed.   Disabled (colon/lung CA), retired.   Lives in [place] with her dtr, grand-dtr, and son.   Active.   Gets regular exercise, walks.   Right handed    Social Determinants of Health   Financial Resource Strain: Low Risk  (11/29/2021)   Overall Financial Resource Strain (CARDIA)    Difficulty of Paying Living Expenses: Not hard at all  Food Insecurity: No Food Insecurity (09/10/2022)   Hunger Vital Sign    Worried About Running Out of Food in the Last Year: Never true    Ran Out of Food in the Last Year: Never true  Transportation Needs: No Transportation Needs (09/10/2022)   PRAPARE - Administrator, Civil Service (Medical): No    Lack of Transportation (Non-Medical): No  Physical Activity: Not on file  Stress: No Stress Concern Present (11/29/2021)   [employer] of Occupational Health - Occupational Stress Questionnaire    Feeling of Stress : Only a little  Social Connections: Unknown (11/29/2021)   Social Connection and Isolation Panel [NHANES]    Frequency of Communication with Friends and Family: More than three times a week    Frequency of Social Gatherings with Friends and Family: More than three times a week    Attends Religious Services: Not on file    Active Member of Clubs or Organizations: Not on file    Attends Banker Meetings: Not on file    Marital Status: Not on file  Intimate Partner Violence: Not At Risk (11/29/2021)   Humiliation, Afraid, Rape, and Kick questionnaire    Fear of Current or Ex-Partner: No    Emotionally Abused: No    Physically Abused: No    Sexually Abused: No     Outpatient Medications Prior to Visit  Medication Sig Dispense Refill   ALPRAZolam (XANAX) 0.5 MG tablet TAKE 1/2 TABLET BY MOUTH ONCE DAILY AS NEEDED 30 tablet 0   aspirin EC 81 MG tablet Take 81 mg by mouth as  needed.      Carbidopa-Levodopa ER (SINEMET CR) 25-100 MG tablet controlled release 2 at 7am/1 at 11am/1 at 4pm and 2 at BED 540 tablet 1   cholecalciferol (VITAMIN D3) 25 MCG (1000 UNIT) tablet Take 1,000 Units by mouth daily.     citalopram (CELEXA) 10 MG tablet  Take 1 tablet (10 mg total) by mouth daily. 90 tablet 0   fluticasone-salmeterol (ADVAIR HFA) 115-21 MCG/ACT inhaler Inhale 2 puffs into the lungs 2 (two) times daily. 1 Inhaler 4   furosemide (LASIX) 20 MG tablet Take 1 tablet (20 mg total) by mouth daily. 30 tablet 11   montelukast (SINGULAIR) 10 MG tablet TAKE 1 TABLET BY MOUTH AT BEDTIME 30 tablet 2   PROAIR HFA 108 (90 Base) MCG/ACT inhaler INHALE 2 PUFFS BY MOUTH INTO THE LUNGS EVERY 6 HOURS AS NEEDED FOR WHEEZING 8.5 g 2   rosuvastatin (CRESTOR) 5 MG tablet TAKE 1 TABLET BY MOUTH EVERY MONDAY, WEDNESDAY AND FRIDAY 39 tablet 1   traZODone (DESYREL) 50 MG tablet TAKE 2 TABLETS BY MOUTH AT BEDTIME 180 tablet 1   vitamin B-12 (CYANOCOBALAMIN) 100 MCG tablet Take 100 mcg by mouth daily.     No facility-administered medications prior to visit.    Allergies  Allergen Reactions   Macrobid [Nitrofurantoin Macrocrystal] Shortness Of Breath and Palpitations   Antihistamines, Diphenhydramine-Type Other (See Comments)    She does not know which antihistamine she has an allergic reaction to.   Aspirin Other (See Comments)    Makes her sick on the stomach if she takes too many and causes bleeding.  Of note, she can take ibuprofen. Other reaction(s): Other (See Comments) Makes her sick on the stomach.  Of note, she can take ibuprofen. Makes her sick on the stomach.  Of note, she can take ibuprofen.   Ceftin [Cefuroxime Axetil] Other (See Comments)    Chest pains   Celecoxib Other (See Comments)    Reaction: Blood Disorder GI bleeding.   Cymbalta [Duloxetine Hcl] Other (See Comments)    Reaction: unknown   Diphenhydramine Other (See Comments)   Escitalopram Other (See Comments)    Lexapro [Escitalopram Oxalate] Other (See Comments)    Reaction: Difficulty with speech   Metronidazole Other (See Comments)    Other reaction(s): Unknown   Nitrofurantoin Other (See Comments)   Zoloft [Sertraline Hcl]    Ciprofloxacin Other (See Comments)    Reaction: unknown    ROS Review of Systems  Constitutional:  Positive for fever.  HENT:  Positive for sore throat.   Respiratory:  Positive for cough.   Gastrointestinal: Negative.   Psychiatric/Behavioral: Negative.        Objective:    Physical Exam Constitutional:      Appearance: She is well-developed and normal weight.  HENT:     Head: Normocephalic.     Right Ear: Tympanic membrane normal.     Left Ear: Tympanic membrane normal.     Mouth/Throat:     Pharynx: Posterior oropharyngeal erythema present.     Tonsils: Tonsillar exudate present.  Cardiovascular:     Rate and Rhythm: Normal rate and regular rhythm.     Heart sounds: Normal heart sounds.  Pulmonary:     Effort: Pulmonary effort is normal.     Breath sounds: Normal breath sounds. No wheezing or rhonchi.  Skin:    General: Skin is warm.  Neurological:     General: No focal deficit present.     Mental Status: She is alert.  Psychiatric:        Mood and Affect: Mood normal.     BP 116/70   Pulse 65   Temp (!) 97.5 F (36.4 C) (Oral)   Ht 5\' 3"  (1.6 m)   Wt 157 lb 9.6 oz (71.5 kg)   SpO2 99%  BMI 27.92 kg/m  Wt Readings from Last 3 Encounters:  10/05/22 157 lb 9.6 oz (71.5 kg)  09/27/22 161 lb 12 oz (73.4 kg)  08/30/22 156 lb (70.8 kg)     Health Maintenance  Topic Date Due   MAMMOGRAM  02/07/2019   COVID-19 Vaccine (3 - Moderna risk series) 01/30/2020   Medicare Annual Wellness (AWV)  11/30/2022   Zoster Vaccines- Shingrix (1 of 2) 10/31/2022 (Originally 11/17/1969)   INFLUENZA VACCINE  01/10/2023   COLONOSCOPY (Pts 45-6yrs Insurance coverage will need to be confirmed)  03/21/2026   DTaP/Tdap/Td (2 - Td or Tdap) 01/07/2030    Pneumonia Vaccine 68+ Years old  Completed   DEXA SCAN  Completed   Hepatitis C Screening  Completed   HPV VACCINES  Aged Out    There are no preventive care reminders to display for this patient.  Lab Results  Component Value Date   TSH 1.67 03/09/2022   Lab Results  Component Value Date   WBC 10.7 (H) 05/20/2022   HGB 11.9 (L) 05/20/2022   HCT 37.2 05/20/2022   MCV 90.7 05/20/2022   PLT 254 05/20/2022   Lab Results  Component Value Date   NA 140 07/05/2022   K 3.7 07/05/2022   CO2 26 07/05/2022   GLUCOSE 104 (H) 07/05/2022   BUN 17 07/05/2022   CREATININE 0.98 07/05/2022   BILITOT 0.3 05/20/2022   ALKPHOS 61 05/20/2022   AST 23 05/20/2022   ALT 18 05/20/2022   PROT 6.5 05/20/2022   ALBUMIN 3.6 05/20/2022   CALCIUM 8.9 07/05/2022   ANIONGAP 6 05/20/2022   EGFR 56 (L) 02/17/2021   GFR 57.62 (L) 07/05/2022   Lab Results  Component Value Date   CHOL 207 (H) 03/09/2022   Lab Results  Component Value Date   HDL 72.60 03/09/2022   Lab Results  Component Value Date   LDLCALC 119 (H) 03/09/2022   Lab Results  Component Value Date   TRIG 77.0 03/09/2022   Lab Results  Component Value Date   CHOLHDL 3 03/09/2022   Lab Results  Component Value Date   HGBA1C 5.9 03/09/2022      Assessment & Plan:  Pharyngitis, unspecified etiology Assessment & Plan: POCT Strep and Flu negative. Will send throat swab for culture. Azithromycin sent to the pharmacy. Advised patient to take Tylenol or throat numbing spray OTC. Increase fluid intake.  Orders: -     Azithromycin; Take 2 tablets on day 1, then 1 tablet daily on days 2 through 5  Dispense: 6 tablet; Refill: 0  Sore throat -     POCT Influenza A/B -     POCT rapid strep A -     Culture, Group A Strep    Follow-up: Return if symptoms worsen or fail to improve.   Kara Dies, NP

## 2022-10-05 NOTE — Assessment & Plan Note (Signed)
POCT Strep and Flu negative. Will send throat swab for culture. Azithromycin sent to the pharmacy. Advised patient to take Tylenol or throat numbing spray OTC. Increase fluid intake.

## 2022-10-05 NOTE — Patient Instructions (Addendum)
Zpack sent to pharmacy. Increase fluid intake and take OTC sore throat spray. Have sent throat culture to lab.

## 2022-10-07 ENCOUNTER — Encounter: Payer: Self-pay | Admitting: Nurse Practitioner

## 2022-10-07 LAB — CULTURE, GROUP A STREP
MICRO NUMBER:: 14879769
SPECIMEN QUALITY:: ADEQUATE

## 2022-10-07 NOTE — Progress Notes (Signed)
The culture shows no infection.

## 2022-10-15 ENCOUNTER — Other Ambulatory Visit: Payer: 59

## 2022-10-17 ENCOUNTER — Other Ambulatory Visit: Payer: 59

## 2022-10-17 ENCOUNTER — Other Ambulatory Visit: Payer: Self-pay | Admitting: Internal Medicine

## 2022-10-19 DIAGNOSIS — M170 Bilateral primary osteoarthritis of knee: Secondary | ICD-10-CM | POA: Diagnosis not present

## 2022-10-19 DIAGNOSIS — Z96652 Presence of left artificial knee joint: Secondary | ICD-10-CM | POA: Diagnosis not present

## 2022-10-25 ENCOUNTER — Other Ambulatory Visit (INDEPENDENT_AMBULATORY_CARE_PROVIDER_SITE_OTHER): Payer: 59

## 2022-10-25 DIAGNOSIS — R739 Hyperglycemia, unspecified: Secondary | ICD-10-CM

## 2022-10-25 DIAGNOSIS — D649 Anemia, unspecified: Secondary | ICD-10-CM | POA: Diagnosis not present

## 2022-10-25 DIAGNOSIS — E7800 Pure hypercholesterolemia, unspecified: Secondary | ICD-10-CM | POA: Diagnosis not present

## 2022-10-25 DIAGNOSIS — I10 Essential (primary) hypertension: Secondary | ICD-10-CM | POA: Diagnosis not present

## 2022-10-25 LAB — CBC WITH DIFFERENTIAL/PLATELET
Basophils Absolute: 0.1 10*3/uL (ref 0.0–0.1)
Basophils Relative: 0.9 % (ref 0.0–3.0)
Eosinophils Absolute: 0.1 10*3/uL (ref 0.0–0.7)
Eosinophils Relative: 1.7 % (ref 0.0–5.0)
HCT: 33.8 % — ABNORMAL LOW (ref 36.0–46.0)
Hemoglobin: 11.4 g/dL — ABNORMAL LOW (ref 12.0–15.0)
Lymphocytes Relative: 19.2 % (ref 12.0–46.0)
Lymphs Abs: 1.6 10*3/uL (ref 0.7–4.0)
MCHC: 33.8 g/dL (ref 30.0–36.0)
MCV: 87.5 fl (ref 78.0–100.0)
Monocytes Absolute: 0.5 10*3/uL (ref 0.1–1.0)
Monocytes Relative: 6.4 % (ref 3.0–12.0)
Neutro Abs: 6.1 10*3/uL (ref 1.4–7.7)
Neutrophils Relative %: 71.8 % (ref 43.0–77.0)
Platelets: 239 10*3/uL (ref 150.0–400.0)
RBC: 3.86 Mil/uL — ABNORMAL LOW (ref 3.87–5.11)
RDW: 14.3 % (ref 11.5–15.5)
WBC: 8.5 10*3/uL (ref 4.0–10.5)

## 2022-10-25 LAB — LIPID PANEL
Cholesterol: 199 mg/dL (ref 0–200)
HDL: 61.5 mg/dL (ref >39.00)
LDL Cholesterol: 121 mg/dL — ABNORMAL HIGH (ref 0–99)
NonHDL: 137.3
Total CHOL/HDL Ratio: 3
Triglycerides: 83 mg/dL (ref 0.0–149.0)
VLDL: 16.6 mg/dL (ref 0.0–40.0)

## 2022-10-25 LAB — HEPATIC FUNCTION PANEL
ALT: 3 U/L (ref 0–35)
AST: 19 U/L (ref 0–37)
Albumin: 3.5 g/dL (ref 3.5–5.2)
Alkaline Phosphatase: 72 U/L (ref 39–117)
Bilirubin, Direct: 0.1 mg/dL (ref 0.0–0.3)
Total Bilirubin: 0.6 mg/dL (ref 0.2–1.2)
Total Protein: 6 g/dL (ref 6.0–8.3)

## 2022-10-25 LAB — BASIC METABOLIC PANEL
BUN: 17 mg/dL (ref 6–23)
CO2: 28 mEq/L (ref 19–32)
Calcium: 8.9 mg/dL (ref 8.4–10.5)
Chloride: 102 mEq/L (ref 96–112)
Creatinine, Ser: 0.85 mg/dL (ref 0.40–1.20)
GFR: 68.2 mL/min (ref >60.00)
Glucose, Bld: 90 mg/dL (ref 70–99)
Potassium: 4.5 mEq/L (ref 3.5–5.1)
Sodium: 138 mEq/L (ref 135–145)

## 2022-10-25 LAB — IBC + FERRITIN
Ferritin: 75.4 ng/mL (ref 10.0–291.0)
Iron: 94 ug/dL (ref 42–145)
Saturation Ratios: 38.8 % (ref 20.0–50.0)
TIBC: 242.2 ug/dL — ABNORMAL LOW (ref 250.0–450.0)
Transferrin: 173 mg/dL — ABNORMAL LOW (ref 212.0–360.0)

## 2022-10-25 LAB — HEMOGLOBIN A1C: Hgb A1c MFr Bld: 6 % (ref 4.6–6.5)

## 2022-10-30 ENCOUNTER — Ambulatory Visit (INDEPENDENT_AMBULATORY_CARE_PROVIDER_SITE_OTHER): Payer: 59 | Admitting: Podiatry

## 2022-10-30 DIAGNOSIS — D2371 Other benign neoplasm of skin of right lower limb, including hip: Secondary | ICD-10-CM | POA: Diagnosis not present

## 2022-10-30 NOTE — Progress Notes (Signed)
Chief Complaint  Patient presents with   Toe Pain    Right foot 5th toe pt stated that she has corn that is causing her discomfort    Subjective: 72 y.o. female presenting to the office today for evaluation of pain and tenderness associated to the medial aspect of the right great toe skin lesion.  She says that the corn has developed and is very symptomatic and painful.  She presents for further treatment and evaluation   Past Medical History:  Diagnosis Date   Anxiety    Asthma    Atypical chest pain    a. 2003 Cath: reportedly nl per pt [name]);  b. 04/2015 [facility] Admission, neg troponin.   Colon cancer (HCC) 1999   a. 1999: Pt had partial colectomy. Did develop lung metastasis. Had right upper lobectomy in 01/2007 then had associated chemotherapy.   COPD (chronic obstructive pulmonary disease) (HCC)    DDD (degenerative disc disease), lumbosacral    Depression    Echocardiogram abnormal    a. 02/2010 Echo: EF 55-60%, mild LAE, no regional wall motion abnormalities   Elevated transaminase level    a. ? NASH;  b. 06/2015 - nl LFTs.   GERD (gastroesophageal reflux disease)    Hematuria    Holter monitor, abnormal    a. 12/2009 Holter monitoring: NSR, rare PACs and PVCs.   Hypercholesterolemia    Hyperlipidemia    Hypertension    Leaky heart valve    a. Per pt report - no evidence of valvular abnormalities on prior echoes.   Lesion of skin of Right breast 10/20/2017   Lung metastases 2008   a. S/P resection (lobectomy - right)   Morbid obesity (HCC)    Nephrolithiasis    pt denies   Parkinson disease 08/2016   [name]    Past Surgical History:  Procedure Laterality Date   BREAST EXCISIONAL BIOPSY Right 10/07/2009   benign   CARDIAC CATHETERIZATION  2003   negative as per pt report   CHOLECYSTECTOMY  2000   COLON SURGERY     Colon cancer, removed part of large colon   COLONOSCOPY N/A 04/25/2015   Procedure: COLONOSCOPY;  Surgeon: Scot Jun, MD;  Location:  [facility] ENDOSCOPY;  Service: Endoscopy;  Laterality: N/A;   COLONOSCOPY WITH PROPOFOL N/A 06/14/2017   Procedure: COLONOSCOPY WITH PROPOFOL;  Surgeon: Scot Jun, MD;  Location: [facility] ENDOSCOPY;  Service: Endoscopy;  Laterality: N/A;   COLONOSCOPY WITH PROPOFOL N/A 11/17/2019   Procedure: COLONOSCOPY WITH PROPOFOL;  Surgeon: Wyline Mood, MD;  Location: [facility] ENDOSCOPY;  Service: Gastroenterology;  Laterality: N/A;   COLONOSCOPY WITH PROPOFOL N/A 03/21/2021   Procedure: COLONOSCOPY WITH PROPOFOL;  Surgeon: Regis Bill, MD;  Location: [facility] ENDOSCOPY;  Service: Endoscopy;  Laterality: N/A;   ESOPHAGOGASTRODUODENOSCOPY (EGD) WITH PROPOFOL N/A 03/21/2021   Procedure: ESOPHAGOGASTRODUODENOSCOPY (EGD) WITH PROPOFOL;  Surgeon: Regis Bill, MD;  Location: [facility] ENDOSCOPY;  Service: Endoscopy;  Laterality: N/A;   LOBECTOMY  01/2007   Right lung, upper lobe right side removed   TOTAL ABDOMINAL HYSTERECTOMY     abnormal bleeding   TUBAL LIGATION  1980    Allergies  Allergen Reactions   Macrobid [Nitrofurantoin Macrocrystal] Shortness Of Breath and Palpitations   Antihistamines, Diphenhydramine-Type Other (See Comments)    She does not know which antihistamine she has an allergic reaction to.   Aspirin Other (See Comments)    Makes her sick on the stomach if she takes too many and causes bleeding.  Of note, she can take ibuprofen. Other reaction(s): Other (See Comments) Makes her sick on the stomach.  Of note, she can take ibuprofen. Makes her sick on the stomach.  Of note, she can take ibuprofen.   Ceftin [Cefuroxime Axetil] Other (See Comments)    Chest pains   Celecoxib Other (See Comments)    Reaction: Blood Disorder GI bleeding.   Cymbalta [Duloxetine Hcl] Other (See Comments)    Reaction: unknown   Diphenhydramine Other (See Comments)   Escitalopram Other (See Comments)   Lexapro [Escitalopram Oxalate] Other (See Comments)    Reaction: Difficulty with speech   Metronidazole  Other (See Comments)    Other reaction(s): Unknown   Nitrofurantoin Other (See Comments)   Zoloft [Sertraline Hcl]    Ciprofloxacin Other (See Comments)    Reaction: unknown     Objective:  Physical Exam General: Alert and oriented x3 in no acute distress  Dermatology: Hyperkeratotic lesion(s) present on the medial aspect of the right great toe. Pain on palpation with a central nucleated core noted. Skin is warm, dry and supple bilateral lower extremities. Negative for open lesions or macerations.  Vascular: Palpable pedal pulses bilaterally. No edema or erythema noted. Capillary refill within normal limits.  Neurological: Epicritic and protective threshold grossly intact bilaterally.   Musculoskeletal Exam: Pain on palpation at the keratotic lesion(s) noted. Range of motion within normal limits bilateral. Muscle strength 5/5 in all groups bilateral.  Assessment: 1.  Symptomatic benign skin lesion medial aspect right great toe   Plan of Care:  1. Patient evaluated 2. Excisional debridement of keratoic lesion(s) using a chisel blade was performed without incident.  Salicylic acid and a light dressing applied 3. Dressed area with light dressing.   4.  Recommend wide fitting shoes that do not constrict the toebox area 5. Patient is to return to the clinic PRN.   Felecia Shelling, DPM [facility]  Dr. Felecia Shelling, DPM    [address] [place], [place] [address]                Office [phone]  Fax [phone]

## 2022-11-12 ENCOUNTER — Telehealth: Payer: Self-pay | Admitting: Internal Medicine

## 2022-11-12 DIAGNOSIS — D649 Anemia, unspecified: Secondary | ICD-10-CM

## 2022-11-12 DIAGNOSIS — Z85038 Personal history of other malignant neoplasm of large intestine: Secondary | ICD-10-CM

## 2022-11-12 NOTE — Telephone Encounter (Signed)
Ok to send referral? Last result note recommended GI referral.

## 2022-11-12 NOTE — Telephone Encounter (Signed)
Ok to refer to Dr Allegra Lai.  She has been followed by [facility] - most recently Dr Mia Creek.  Confirm she is wanting to change to Dr Allegra Lai.  Also, she has a history of colon cancer.  Poor prep last colonoscopy attempt.  Given history and iron deficient anemia, she does need f/u with GI.  Ok to place referral to Dr Allegra Lai

## 2022-11-12 NOTE — Telephone Encounter (Signed)
Pt called stating she would like the provider to send a GI referral to [facility]

## 2022-11-14 NOTE — Telephone Encounter (Signed)
Confirmed with patient. GI referral placed.

## 2022-11-14 NOTE — Addendum Note (Signed)
Addended by: Rita Ohara D on: 11/14/2022 10:50 AM   Modules accepted: Orders

## 2022-11-22 ENCOUNTER — Ambulatory Visit (INDEPENDENT_AMBULATORY_CARE_PROVIDER_SITE_OTHER): Payer: 59 | Admitting: Internal Medicine

## 2022-11-22 VITALS — BP 130/76 | HR 74 | Temp 97.7°F | Resp 16 | Ht 63.0 in | Wt 167.6 lb

## 2022-11-22 DIAGNOSIS — I779 Disorder of arteries and arterioles, unspecified: Secondary | ICD-10-CM

## 2022-11-22 DIAGNOSIS — I773 Arterial fibromuscular dysplasia: Secondary | ICD-10-CM

## 2022-11-22 DIAGNOSIS — G20A1 Parkinson's disease without dyskinesia, without mention of fluctuations: Secondary | ICD-10-CM

## 2022-11-22 DIAGNOSIS — K219 Gastro-esophageal reflux disease without esophagitis: Secondary | ICD-10-CM

## 2022-11-22 DIAGNOSIS — Z85038 Personal history of other malignant neoplasm of large intestine: Secondary | ICD-10-CM

## 2022-11-22 DIAGNOSIS — R739 Hyperglycemia, unspecified: Secondary | ICD-10-CM | POA: Diagnosis not present

## 2022-11-22 DIAGNOSIS — E7800 Pure hypercholesterolemia, unspecified: Secondary | ICD-10-CM

## 2022-11-22 DIAGNOSIS — R0602 Shortness of breath: Secondary | ICD-10-CM

## 2022-11-22 DIAGNOSIS — M7989 Other specified soft tissue disorders: Secondary | ICD-10-CM | POA: Diagnosis not present

## 2022-11-22 DIAGNOSIS — F419 Anxiety disorder, unspecified: Secondary | ICD-10-CM

## 2022-11-22 DIAGNOSIS — Z8589 Personal history of malignant neoplasm of other organs and systems: Secondary | ICD-10-CM

## 2022-11-22 DIAGNOSIS — D649 Anemia, unspecified: Secondary | ICD-10-CM

## 2022-11-22 DIAGNOSIS — I10 Essential (primary) hypertension: Secondary | ICD-10-CM

## 2022-11-22 DIAGNOSIS — N183 Chronic kidney disease, stage 3 unspecified: Secondary | ICD-10-CM

## 2022-11-22 DIAGNOSIS — I700 Atherosclerosis of aorta: Secondary | ICD-10-CM

## 2022-11-22 DIAGNOSIS — F339 Major depressive disorder, recurrent, unspecified: Secondary | ICD-10-CM

## 2022-11-22 DIAGNOSIS — K760 Fatty (change of) liver, not elsewhere classified: Secondary | ICD-10-CM

## 2022-11-22 NOTE — Progress Notes (Signed)
Subjective:    Patient ID: Victoria Hanson, female    DOB: [birth year], 72 y.o.   MRN: [id number]  Patient here for  Chief Complaint  Patient presents with   Medical Management of Chronic Issues    HPI Here for follow up appt - f/u regarding her blood pressure and parkinsons.  Seeing Dr Tat for f/u of parkinsons. On sinemet.  Has been doing better overall with her movements.  Discussed her breathing.  Describes some sob with increased exertion, but overall breathing appears to be stable.  (She has related this previously to taking sinemet).  Discussed monitoring diet - watching sodium intake.  No increased acid reflux. Some gas.  No abdominal pain.     Past Medical History:  Diagnosis Date   Anxiety    Asthma    Atypical chest pain    a. 2003 Cath: reportedly nl per pt [name]);  b. 04/2015 [facility] Admission, neg troponin.   Colon cancer (HCC) 1999   a. 1999: Pt had partial colectomy. Did develop lung metastasis. Had right upper lobectomy in 01/2007 then had associated chemotherapy.   COPD (chronic obstructive pulmonary disease) (HCC)    DDD (degenerative disc disease), lumbosacral    Depression    Echocardiogram abnormal    a. 02/2010 Echo: EF 55-60%, mild LAE, no regional wall motion abnormalities   Elevated transaminase level    a. ? NASH;  b. 06/2015 - nl LFTs.   GERD (gastroesophageal reflux disease)    Hematuria    Holter monitor, abnormal    a. 12/2009 Holter monitoring: NSR, rare PACs and PVCs.   Hypercholesterolemia    Hyperlipidemia    Hypertension    Leaky heart valve    a. Per pt report - no evidence of valvular abnormalities on prior echoes.   Lesion of skin of Right breast 10/20/2017   Lung metastases 2008   a. S/P resection (lobectomy - right)   Morbid obesity (HCC)    Nephrolithiasis    pt denies   Parkinson disease 08/2016   [name]   Past Surgical History:  Procedure Laterality Date   BREAST EXCISIONAL BIOPSY Right 10/07/2009   benign   CARDIAC  CATHETERIZATION  2003   negative as per pt report   CHOLECYSTECTOMY  2000   COLON SURGERY     Colon cancer, removed part of large colon   COLONOSCOPY N/A 04/25/2015   Procedure: COLONOSCOPY;  Surgeon: Scot Jun, MD;  Location: [facility] ENDOSCOPY;  Service: Endoscopy;  Laterality: N/A;   COLONOSCOPY WITH PROPOFOL N/A 06/14/2017   Procedure: COLONOSCOPY WITH PROPOFOL;  Surgeon: Scot Jun, MD;  Location: [facility] ENDOSCOPY;  Service: Endoscopy;  Laterality: N/A;   COLONOSCOPY WITH PROPOFOL N/A 11/17/2019   Procedure: COLONOSCOPY WITH PROPOFOL;  Surgeon: Wyline Mood, MD;  Location: [facility] ENDOSCOPY;  Service: Gastroenterology;  Laterality: N/A;   COLONOSCOPY WITH PROPOFOL N/A 03/21/2021   Procedure: COLONOSCOPY WITH PROPOFOL;  Surgeon: Regis Bill, MD;  Location: [facility] ENDOSCOPY;  Service: Endoscopy;  Laterality: N/A;   ESOPHAGOGASTRODUODENOSCOPY (EGD) WITH PROPOFOL N/A 03/21/2021   Procedure: ESOPHAGOGASTRODUODENOSCOPY (EGD) WITH PROPOFOL;  Surgeon: Regis Bill, MD;  Location: [facility] ENDOSCOPY;  Service: Endoscopy;  Laterality: N/A;   LOBECTOMY  01/2007   Right lung, upper lobe right side removed   TOTAL ABDOMINAL HYSTERECTOMY     abnormal bleeding   TUBAL LIGATION  1980   Family History  Problem Relation Age of Onset   Coronary artery disease Mother  died @ 83 of MI.   Mental illness Mother    Diabetes Mother    Heart attack Father 46       MI   Cancer Father        lung - died in his mid-70's   Hypertension Father    Diabetes Father    Sudden death Brother    Mental illness Brother    Heart attack Brother        MI @ 83, died @ age 38.   Hypertension Child    Breast cancer Neg Hx    Social History   Socioeconomic History   Marital status: Widowed    Spouse name: Not on file   Number of children: Not on file   Years of education: Not on file   Highest education level: Not on file  Occupational History   Not on file  Tobacco Use   Smoking status:  Never    Passive exposure: Yes   Smokeless tobacco: Never   Tobacco comments:    husband and son smoked in her home.  Vaping Use   Vaping Use: Never used  Substance and Sexual Activity   Alcohol use: Not Currently    Alcohol/week: 0.0 standard drinks of alcohol   Drug use: No   Sexual activity: Never  Other Topics Concern   Not on file  Social History Narrative   Widowed.   Disabled (colon/lung CA), retired.   Lives in [place] with her dtr, grand-dtr, and son.   Active.   Gets regular exercise, walks.   Right handed    Social Determinants of Health   Financial Resource Strain: Low Risk  (11/29/2021)   Overall Financial Resource Strain (CARDIA)    Difficulty of Paying Living Expenses: Not hard at all  Food Insecurity: No Food Insecurity (09/10/2022)   Hunger Vital Sign    Worried About Running Out of Food in the Last Year: Never true    Ran Out of Food in the Last Year: Never true  Transportation Needs: No Transportation Needs (09/10/2022)   PRAPARE - Administrator, Civil Service (Medical): No    Lack of Transportation (Non-Medical): No  Physical Activity: Not on file  Stress: No Stress Concern Present (11/29/2021)   [employer] of Occupational Health - Occupational Stress Questionnaire    Feeling of Stress : Only a little  Social Connections: Unknown (11/29/2021)   Social Connection and Isolation Panel [NHANES]    Frequency of Communication with Friends and Family: More than three times a week    Frequency of Social Gatherings with Friends and Family: More than three times a week    Attends Religious Services: Not on Marketing executive or Organizations: Not on file    Attends Banker Meetings: Not on file    Marital Status: Not on file     Review of Systems  Constitutional:  Negative for appetite change and unexpected weight change.  HENT:  Negative for congestion and sinus pressure.   Respiratory:  Negative for cough and  chest tightness.   Cardiovascular:  Negative for chest pain and palpitations.       Chronic leg swelling.    Gastrointestinal:  Negative for abdominal pain, nausea and vomiting.  Genitourinary:  Negative for difficulty urinating and dysuria.  Musculoskeletal:  Negative for joint swelling and myalgias.  Skin:  Negative for color change and rash.  Neurological:  Negative for dizziness and headaches.  Psychiatric/Behavioral:  Negative for agitation and dysphoric mood.        Objective:     BP 130/76   Pulse 74   Temp 97.7 F (36.5 C)   Resp 16   Ht 5\' 3"  (1.6 m)   Wt 167 lb 9.6 oz (76 kg)   SpO2 98%   BMI 29.69 kg/m  Wt Readings from Last 3 Encounters:  11/22/22 167 lb 9.6 oz (76 kg)  10/05/22 157 lb 9.6 oz (71.5 kg)  09/27/22 161 lb 12 oz (73.4 kg)    Physical Exam Vitals reviewed.  Constitutional:      General: She is not in acute distress.    Appearance: Normal appearance.  HENT:     Head: Normocephalic and atraumatic.     Right Ear: External ear normal.     Left Ear: External ear normal.  Eyes:     General: No scleral icterus.       Right eye: No discharge.        Left eye: No discharge.     Conjunctiva/sclera: Conjunctivae normal.  Neck:     Thyroid: No thyromegaly.  Cardiovascular:     Rate and Rhythm: Normal rate and regular rhythm.  Pulmonary:     Effort: No respiratory distress.     Breath sounds: Normal breath sounds. No wheezing.  Abdominal:     General: Bowel sounds are normal.     Palpations: Abdomen is soft.     Tenderness: There is no abdominal tenderness.  Musculoskeletal:        General: No tenderness.     Cervical back: Neck supple. No tenderness.     Comments: Pedal and lower extremity swelling - chronic.    Lymphadenopathy:     Cervical: No cervical adenopathy.  Skin:    Findings: No erythema or rash.  Neurological:     Mental Status: She is alert.  Psychiatric:        Mood and Affect: Mood normal.        Behavior: Behavior normal.       Outpatient Encounter Medications as of 11/22/2022  Medication Sig   ALPRAZolam (XANAX) 0.5 MG tablet TAKE 1/2 TABLET BY MOUTH ONCE DAILY AS NEEDED   aspirin EC 81 MG tablet Take 81 mg by mouth as needed.    Carbidopa-Levodopa ER (SINEMET CR) 25-100 MG tablet controlled release 2 at 7am/1 at 11am/1 at 4pm and 2 at BED   cholecalciferol (VITAMIN D3) 25 MCG (1000 UNIT) tablet Take 1,000 Units by mouth daily.   citalopram (CELEXA) 10 MG tablet Take 1 tablet (10 mg total) by mouth daily.   fluticasone-salmeterol (ADVAIR HFA) 115-21 MCG/ACT inhaler Inhale 2 puffs into the lungs 2 (two) times daily.   furosemide (LASIX) 20 MG tablet Take 1 tablet (20 mg total) by mouth daily.   montelukast (SINGULAIR) 10 MG tablet TAKE 1 TABLET BY MOUTH AT BEDTIME   PROAIR HFA 108 (90 Base) MCG/ACT inhaler INHALE 2 PUFFS BY MOUTH INTO THE LUNGS EVERY 6 HOURS AS NEEDED FOR WHEEZING   rosuvastatin (CRESTOR) 5 MG tablet TAKE 1 TABLET BY MOUTH EVERY MONDAY, WEDNESDAY AND FRIDAY   traZODone (DESYREL) 50 MG tablet TAKE 2 TABLETS BY MOUTH AT BEDTIME   vitamin B-12 (CYANOCOBALAMIN) 100 MCG tablet Take 100 mcg by mouth daily.   No facility-administered encounter medications on file as of 11/22/2022.     Lab Results  Component Value Date   WBC 8.5 10/25/2022   HGB 11.4 (L) 10/25/2022  HCT 33.8 (L) 10/25/2022   PLT 239.0 10/25/2022   GLUCOSE 90 10/25/2022   CHOL 199 10/25/2022   TRIG 83.0 10/25/2022   HDL 61.50 10/25/2022   LDLDIRECT 163.4 12/31/2012   LDLCALC 121 (H) 10/25/2022   ALT 3 10/25/2022   AST 19 10/25/2022   NA 138 10/25/2022   K 4.5 hemolyzed sample 10/25/2022   CL 102 10/25/2022   CREATININE 0.85 10/25/2022   BUN 17 10/25/2022   CO2 28 10/25/2022   TSH 1.67 03/09/2022   INR 1.0 12/12/2012   HGBA1C 6.0 10/25/2022    NM Bone Scan 3 Phase  Result Date: 09/21/2022 CLINICAL DATA:  LEFT knee arthroplasty EXAM: NUCLEAR MEDICINE 3-PHASE BONE SCAN TECHNIQUE: Radionuclide angiographic images,  immediate static blood pool images, and 3-hour delayed static images were obtained of the knees after intravenous injection of radiopharmaceutical. RADIOPHARMACEUTICALS:  21.0 mCi Tc-47m MDP IV COMPARISON:  None Available. FINDINGS: Vascular phase: No asymmetric or increased blood flow to the LEFT or RIGHT knee. Blood pool phase: Mild asymmetric blood pool activity surrounding the LEFT knee joint. Delayed phase: Focal radiotracer activity accumulates within the LEFT patella. No abnormal radiotracer accumulation within the LEFT tibial plateau or femoral condyles. Photopenia noted associated with the LEFT knee prosthetic. Mild degenerative uptake in the medial LEFT knee compartment. IMPRESSION: 1. No evidence of loosening infection of the LEFT knee prosthetic. 2. Focal delayed uptake in the LEFT patella. Electronically Signed   By: Genevive Bi M.D.   On: 09/21/2022 10:57       Assessment & Plan:  Gastroesophageal reflux disease without esophagitis Assessment & Plan: Upper symptoms appear to be controlled on prilosec.  Follow.    Anemia, unspecified type Assessment & Plan: Hgb 11.4.  previously seeing GI.  Declined f/u - last lab check.  Some increased gas.  No increased acid reflux.  Feels bowels stable.  Follow cbc.    Anxiety, mild Assessment & Plan: Continue celexa.  Follow. Doing better.     Aortic atherosclerosis (HCC) Assessment & Plan: Reported stop taking crestor.  D/w her regarding restarting.    Bilateral carotid artery disease, unspecified type (HCC) Assessment & Plan: Previously evaluated by [facility].  Needs to continue crestor.     Stage 3 chronic kidney disease, unspecified whether stage 3a or 3b CKD (HCC) Assessment & Plan: Avoid antiinflammatories.  Off micardis due to low blood pressure.  Reports blood pressure varying.  Blood pressure ok today.  Follow pressures. Follow metabolic panel.    Depression, recurrent (HCC) Assessment & Plan: Continue citalopram.    Follow. Doing better.    Fibromuscular dysplasia (HCC) Assessment & Plan: Has been evaluated by [facility].  Continue risk factor modificaiton.    Swelling of lower extremity Assessment & Plan: Discussed leg elevation and compression hose.  Discussed monitoring sodium intake.  Follow.     SOB (shortness of breath) on exertion Assessment & Plan: Continue albuterol inhaler prn and continue advair, singulair. No increased cough or congestion.  Follow volume status.    Pure hypercholesterolemia Assessment & Plan: Needs to restart crestor. Low cholesterol diet and exercise.  Follow lipid panel and liver function tests.    Parkinson's disease, unspecified whether dyskinesia present, unspecified whether manifestations fluctuate Assessment & Plan: Seeing Dr Tat.  On sinemet - taking as outlined.  Has been doing better overall.  Follow.    Primary hypertension Assessment & Plan: Varying pressures.  Hold on additional medication.  Off amlodipine. Lasix.   Follow metabolic panel.  Follow pressures.  Hyperglycemia Assessment & Plan: Low carb diet and exercise.  Follow met b and a1c.    Hypercholesterolemia Assessment & Plan: Needs to take crestor.   Low cholesterol diet and exercise. Follow lipid panel and liver function tests.    History of malignant neoplasm metastatic to lung Assessment & Plan: S/p lobectomy.  Followed by pulmonary.  Albuterol inhaler if needed.    History of colon cancer Assessment & Plan: Recent attempt at colonoscopy - poor prep.   GI recommended follow cbc and iron studies.  Hgb 11.4 last check.  Discussed referral back to GI.  See result note.  She declined.    Hepatic steatosis Assessment & Plan: Diet and exercise.  Follow liver panel. Seen on chest CT angio - 09/11/21   Gastroesophageal reflux disease, unspecified whether esophagitis present Assessment & Plan: No acid reflux.       Dale Eagle, MD

## 2022-11-26 ENCOUNTER — Telehealth: Payer: Self-pay | Admitting: Internal Medicine

## 2022-11-26 DIAGNOSIS — J441 Chronic obstructive pulmonary disease with (acute) exacerbation: Secondary | ICD-10-CM

## 2022-11-26 NOTE — Telephone Encounter (Signed)
Pt daughter Misty Stanley called in stating that pt its having trouble using the inhaler, and she would like to know if Dr. Lorin Picket can prescribed the tube that goes inside the inhaler? And it just to help her breath. Any questions, or concern she's available [phone].

## 2022-11-26 NOTE — Telephone Encounter (Signed)
Received notification that she is overdue mammogram.  Need to schedule.   

## 2022-11-27 ENCOUNTER — Other Ambulatory Visit: Payer: Self-pay

## 2022-11-27 MED ORDER — SPACER/AERO-HOLDING CHAMBERS DEVI
1.0000 [IU] | Freq: Every day | 0 refills | Status: AC
Start: 2022-11-27 — End: ?

## 2022-11-27 NOTE — Telephone Encounter (Signed)
Patient declined mammogram

## 2022-11-27 NOTE — Telephone Encounter (Signed)
Call pt  I have prescribed a spacer.  So that she is aware she may also look on places like [employer], [employer] which sell spacers

## 2022-11-27 NOTE — Telephone Encounter (Signed)
Called and spoke with patient and her daughter. They are needing the spacer chamber that goes on the inhaler. She has used one in the past and it helps her use the inhaler. Confirmed with patient no increased breathing issues. Dr Lorin Picket is out of office the next couple of days. Can I send in the spacer chamber for her?

## 2022-11-27 NOTE — Progress Notes (Signed)
error 

## 2022-11-27 NOTE — Telephone Encounter (Signed)
Spoke to pt and informed her that spacer has been sent in to [facility]

## 2022-11-28 ENCOUNTER — Encounter: Payer: Self-pay | Admitting: Internal Medicine

## 2022-11-28 DIAGNOSIS — R600 Localized edema: Secondary | ICD-10-CM | POA: Diagnosis not present

## 2022-12-01 ENCOUNTER — Encounter: Payer: Self-pay | Admitting: Internal Medicine

## 2022-12-01 NOTE — Assessment & Plan Note (Signed)
Needs to restart crestor. Low cholesterol diet and exercise.  Follow lipid panel and liver function tests.

## 2022-12-01 NOTE — Assessment & Plan Note (Signed)
Recent attempt at colonoscopy - poor prep.   GI recommended follow cbc and iron studies.  Hgb 11.4 last check.  Discussed referral back to GI.  See result note.  She declined.

## 2022-12-01 NOTE — Assessment & Plan Note (Signed)
No acid reflux.   

## 2022-12-01 NOTE — Assessment & Plan Note (Signed)
Needs to take crestor.   Low cholesterol diet and exercise. Follow lipid panel and liver function tests.

## 2022-12-01 NOTE — Assessment & Plan Note (Signed)
Previously evaluated by [facility].  Needs to continue crestor.   

## 2022-12-01 NOTE — Assessment & Plan Note (Signed)
Seeing Dr Tat.  On sinemet - taking as outlined.  Has been doing better overall.  Follow.

## 2022-12-01 NOTE — Assessment & Plan Note (Signed)
Low carb diet and exercise.  Follow met b and a1c.   

## 2022-12-01 NOTE — Assessment & Plan Note (Signed)
Avoid antiinflammatories.  Off micardis due to low blood pressure.  Reports blood pressure varying.  Blood pressure ok today.  Follow pressures. Follow metabolic panel.  

## 2022-12-01 NOTE — Assessment & Plan Note (Signed)
S/p lobectomy.  Followed by pulmonary.  Albuterol inhaler if needed.

## 2022-12-01 NOTE — Assessment & Plan Note (Signed)
Upper symptoms appear to be controlled on prilosec.  Follow.  

## 2022-12-01 NOTE — Assessment & Plan Note (Signed)
Discussed leg elevation and compression hose.  Discussed monitoring sodium intake.  Follow.

## 2022-12-01 NOTE — Assessment & Plan Note (Addendum)
Continue albuterol inhaler prn and continue advair, singulair. No increased cough or congestion.  Follow volume status.

## 2022-12-01 NOTE — Assessment & Plan Note (Signed)
Diet and exercise.  Follow liver panel. Seen on chest CT angio - 09/11/21 

## 2022-12-01 NOTE — Assessment & Plan Note (Signed)
Continue citalopram.   Follow. Doing better.  

## 2022-12-01 NOTE — Assessment & Plan Note (Signed)
Has been evaluated by [facility].  Continue risk factor modificaiton.  

## 2022-12-01 NOTE — Assessment & Plan Note (Signed)
Continue celexa.  Follow. Doing better.

## 2022-12-01 NOTE — Assessment & Plan Note (Signed)
Reported stop taking crestor.  D/w her regarding restarting.

## 2022-12-01 NOTE — Assessment & Plan Note (Signed)
Varying pressures.  Hold on additional medication.  Off amlodipine. Lasix.   Follow metabolic panel.  Follow pressures.   

## 2022-12-01 NOTE — Assessment & Plan Note (Signed)
Hgb 11.4.  previously seeing GI.  Declined f/u - last lab check.  Some increased gas.  No increased acid reflux.  Feels bowels stable.  Follow cbc.

## 2022-12-03 ENCOUNTER — Telehealth: Payer: Self-pay | Admitting: Gastroenterology

## 2022-12-03 ENCOUNTER — Telehealth: Payer: Self-pay | Admitting: Internal Medicine

## 2022-12-03 ENCOUNTER — Telehealth: Payer: Self-pay

## 2022-12-03 ENCOUNTER — Other Ambulatory Visit: Payer: Self-pay | Admitting: Internal Medicine

## 2022-12-03 NOTE — Telephone Encounter (Signed)
Dr Tobi Bastos did patient's colonoscopy in 2021, pt is requesting to change to Dr Allegra Lai (female) going forward

## 2022-12-03 NOTE — Telephone Encounter (Signed)
Rx ok'd for xanax #20 with no refills.

## 2022-12-03 NOTE — Telephone Encounter (Signed)
Pt called in stating that she would like to speak to [place] concerning her GI referral that she got from DR. Scott. She's available [phone].

## 2022-12-03 NOTE — Telephone Encounter (Signed)
Patient called this morning wanting to schedule her appointment for the [place] office today. With an Dr. Allegra Lai I advised her that she is not available but she can see the PA. Patient wanted to see a provider Shawna Orleans took over the call and explain to the patient as well that the provider is not available but offer her to see PA. Shawna Orleans found she is a patient of Dr. Tobi Bastos and advised her she is a patient of Dr. Tobi Bastos. Patient wants to switch provider Melanie advise her she will have to speak with both providers because patient want to switch. I called patient trying to reschedule her appointment and there was no answer. I was unable to leave her a voicemail.

## 2022-12-04 NOTE — Telephone Encounter (Signed)
[name], MD  Victoria Hanson; Wyline Mood, MD13 hours ago (6:57 PM)    Ok with me

## 2022-12-04 NOTE — Telephone Encounter (Signed)
Called patient and discussed notes from GI. Patient is agreeable to see PA. Called GI. System was down so was unable to schedule appt. They are supposed to call me back.

## 2022-12-06 NOTE — Telephone Encounter (Signed)
Pt is seeing GI 7/11.

## 2022-12-14 ENCOUNTER — Encounter: Payer: Self-pay | Admitting: Internal Medicine

## 2022-12-14 ENCOUNTER — Ambulatory Visit (INDEPENDENT_AMBULATORY_CARE_PROVIDER_SITE_OTHER): Payer: 59 | Admitting: Internal Medicine

## 2022-12-14 VITALS — BP 128/70 | HR 72 | Temp 98.0°F | Resp 16 | Ht 63.0 in | Wt 166.0 lb

## 2022-12-14 DIAGNOSIS — E7800 Pure hypercholesterolemia, unspecified: Secondary | ICD-10-CM

## 2022-12-14 DIAGNOSIS — D649 Anemia, unspecified: Secondary | ICD-10-CM

## 2022-12-14 DIAGNOSIS — K760 Fatty (change of) liver, not elsewhere classified: Secondary | ICD-10-CM | POA: Diagnosis not present

## 2022-12-14 DIAGNOSIS — I10 Essential (primary) hypertension: Secondary | ICD-10-CM

## 2022-12-14 DIAGNOSIS — K219 Gastro-esophageal reflux disease without esophagitis: Secondary | ICD-10-CM

## 2022-12-14 DIAGNOSIS — R600 Localized edema: Secondary | ICD-10-CM

## 2022-12-14 DIAGNOSIS — F339 Major depressive disorder, recurrent, unspecified: Secondary | ICD-10-CM

## 2022-12-14 DIAGNOSIS — G20A1 Parkinson's disease without dyskinesia, without mention of fluctuations: Secondary | ICD-10-CM

## 2022-12-14 DIAGNOSIS — R300 Dysuria: Secondary | ICD-10-CM

## 2022-12-14 DIAGNOSIS — F329 Major depressive disorder, single episode, unspecified: Secondary | ICD-10-CM

## 2022-12-14 DIAGNOSIS — I700 Atherosclerosis of aorta: Secondary | ICD-10-CM

## 2022-12-14 DIAGNOSIS — I779 Disorder of arteries and arterioles, unspecified: Secondary | ICD-10-CM | POA: Diagnosis not present

## 2022-12-14 DIAGNOSIS — N183 Chronic kidney disease, stage 3 unspecified: Secondary | ICD-10-CM | POA: Diagnosis not present

## 2022-12-14 DIAGNOSIS — Z8589 Personal history of malignant neoplasm of other organs and systems: Secondary | ICD-10-CM

## 2022-12-14 DIAGNOSIS — R739 Hyperglycemia, unspecified: Secondary | ICD-10-CM | POA: Diagnosis not present

## 2022-12-14 DIAGNOSIS — Z85038 Personal history of other malignant neoplasm of large intestine: Secondary | ICD-10-CM

## 2022-12-14 DIAGNOSIS — I773 Arterial fibromuscular dysplasia: Secondary | ICD-10-CM

## 2022-12-14 DIAGNOSIS — F419 Anxiety disorder, unspecified: Secondary | ICD-10-CM

## 2022-12-14 LAB — CBC WITH DIFFERENTIAL/PLATELET
Basophils Absolute: 0.1 10*3/uL (ref 0.0–0.1)
Basophils Relative: 0.6 % (ref 0.0–3.0)
Eosinophils Absolute: 0.1 10*3/uL (ref 0.0–0.7)
Eosinophils Relative: 1.2 % (ref 0.0–5.0)
HCT: 37.1 % (ref 36.0–46.0)
Hemoglobin: 11.8 g/dL — ABNORMAL LOW (ref 12.0–15.0)
Lymphocytes Relative: 22.1 % (ref 12.0–46.0)
Lymphs Abs: 1.9 10*3/uL (ref 0.7–4.0)
MCHC: 31.7 g/dL (ref 30.0–36.0)
MCV: 89.7 fl (ref 78.0–100.0)
Monocytes Absolute: 0.5 10*3/uL (ref 0.1–1.0)
Monocytes Relative: 5.9 % (ref 3.0–12.0)
Neutro Abs: 6.2 10*3/uL (ref 1.4–7.7)
Neutrophils Relative %: 70.2 % (ref 43.0–77.0)
Platelets: 241 10*3/uL (ref 150.0–400.0)
RBC: 4.14 Mil/uL (ref 3.87–5.11)
RDW: 14.5 % (ref 11.5–15.5)
WBC: 8.8 10*3/uL (ref 4.0–10.5)

## 2022-12-14 LAB — URINALYSIS, ROUTINE W REFLEX MICROSCOPIC
Bilirubin Urine: NEGATIVE
Ketones, ur: NEGATIVE
Nitrite: NEGATIVE
Specific Gravity, Urine: 1.01 (ref 1.000–1.030)
Total Protein, Urine: NEGATIVE
Urine Glucose: NEGATIVE
Urobilinogen, UA: 0.2 (ref 0.0–1.0)
pH: 5.5 (ref 5.0–8.0)

## 2022-12-14 LAB — BASIC METABOLIC PANEL
BUN: 19 mg/dL (ref 6–23)
CO2: 28 mEq/L (ref 19–32)
Calcium: 9.5 mg/dL (ref 8.4–10.5)
Chloride: 103 mEq/L (ref 96–112)
Creatinine, Ser: 0.92 mg/dL (ref 0.40–1.20)
GFR: 61.96 mL/min (ref >60.00)
Glucose, Bld: 92 mg/dL (ref 70–99)
Potassium: 4.1 mEq/L (ref 3.5–5.1)
Sodium: 140 mEq/L (ref 135–145)

## 2022-12-14 LAB — IBC + FERRITIN
Ferritin: 61.5 ng/mL (ref 10.0–291.0)
Iron: 69 ug/dL (ref 42–145)
Saturation Ratios: 23.1 % (ref 20.0–50.0)
TIBC: 298.2 ug/dL (ref 250.0–450.0)
Transferrin: 213 mg/dL (ref 212.0–360.0)

## 2022-12-14 LAB — HEPATIC FUNCTION PANEL
ALT: 3 U/L (ref 0–35)
AST: 11 U/L (ref 0–37)
Albumin: 3.9 g/dL (ref 3.5–5.2)
Alkaline Phosphatase: 71 U/L (ref 39–117)
Bilirubin, Direct: 0.1 mg/dL (ref 0.0–0.3)
Total Bilirubin: 0.4 mg/dL (ref 0.2–1.2)
Total Protein: 6.4 g/dL (ref 6.0–8.3)

## 2022-12-14 LAB — TSH: TSH: 1.22 u[IU]/mL (ref 0.35–5.50)

## 2022-12-14 NOTE — Progress Notes (Unsigned)
Subjective:    Patient ID: Victoria Hanson, female    DOB: 04-Apr-1951, 72 y.o.   MRN: [id number]  Patient here for  Chief Complaint  Patient presents with   Medical Management of Chronic Issues    HPI Here for follow up appt - f/u regarding her blood pressure and parkinsons.  Seeing Dr Tat for f/u of parkinsons. On sinemet. She is able to get around better on the sinemet.  No chest pain.  States she has felt that the sinemet causes some increased sob - has mentioned this intermittent since starting the medication.  No increased cough or congestion.  No acid reflux reported.  No abdominal pain.  Does report her feet/legs swell.  Has had recent lower extremity doppler.  Need results.  Has taken lasix a couple of days this week.  Discuss monitoring sodium intake and discussed daily weights.  Blood pressure varies.     Past Medical History:  Diagnosis Date   Anxiety    Asthma    Atypical chest pain    a. 2003 Cath: reportedly nl per pt [name]);  b. 04/2015 [facility] Admission, neg troponin.   Colon cancer (HCC) 1999   a. 1999: Pt had partial colectomy. Did develop lung metastasis. Had right upper lobectomy in 01/2007 then had associated chemotherapy.   COPD (chronic obstructive pulmonary disease) (HCC)    DDD (degenerative disc disease), lumbosacral    Depression    Echocardiogram abnormal    a. 02/2010 Echo: EF 55-60%, mild LAE, no regional wall motion abnormalities   Elevated transaminase level    a. ? NASH;  b. 06/2015 - nl LFTs.   GERD (gastroesophageal reflux disease)    Hematuria    Holter monitor, abnormal    a. 12/2009 Holter monitoring: NSR, rare PACs and PVCs.   Hypercholesterolemia    Hyperlipidemia    Hypertension    Leaky heart valve    a. Per pt report - no evidence of valvular abnormalities on prior echoes.   Lesion of skin of Right breast 10/20/2017   Lung metastases 2008   a. S/P resection (lobectomy - right)   Morbid obesity (HCC)    Nephrolithiasis    pt denies    Parkinson disease 08/2016   [name]   Past Surgical History:  Procedure Laterality Date   BREAST EXCISIONAL BIOPSY Right 10/07/2009   benign   CARDIAC CATHETERIZATION  2003   negative as per pt report   CHOLECYSTECTOMY  2000   COLON SURGERY     Colon cancer, removed part of large colon   COLONOSCOPY N/A 04/25/2015   Procedure: COLONOSCOPY;  Surgeon: Scot Jun, MD;  Location: [facility] ENDOSCOPY;  Service: Endoscopy;  Laterality: N/A;   COLONOSCOPY WITH PROPOFOL N/A 06/14/2017   Procedure: COLONOSCOPY WITH PROPOFOL;  Surgeon: Scot Jun, MD;  Location: [facility] ENDOSCOPY;  Service: Endoscopy;  Laterality: N/A;   COLONOSCOPY WITH PROPOFOL N/A 11/17/2019   Procedure: COLONOSCOPY WITH PROPOFOL;  Surgeon: Wyline Mood, MD;  Location: [facility] ENDOSCOPY;  Service: Gastroenterology;  Laterality: N/A;   COLONOSCOPY WITH PROPOFOL N/A 03/21/2021   Procedure: COLONOSCOPY WITH PROPOFOL;  Surgeon: Regis Bill, MD;  Location: [facility] ENDOSCOPY;  Service: Endoscopy;  Laterality: N/A;   ESOPHAGOGASTRODUODENOSCOPY (EGD) WITH PROPOFOL N/A 03/21/2021   Procedure: ESOPHAGOGASTRODUODENOSCOPY (EGD) WITH PROPOFOL;  Surgeon: Regis Bill, MD;  Location: [facility] ENDOSCOPY;  Service: Endoscopy;  Laterality: N/A;   LOBECTOMY  01/2007   Right lung, upper lobe right side removed   TOTAL ABDOMINAL  HYSTERECTOMY     abnormal bleeding   TUBAL LIGATION  1980   Family History  Problem Relation Age of Onset   Coronary artery disease Mother        died @ 103 of MI.   Mental illness Mother    Diabetes Mother    Heart attack Father 53       MI   Cancer Father        lung - died in his mid-70's   Hypertension Father    Diabetes Father    Sudden death Brother    Mental illness Brother    Heart attack Brother        MI @ 49, died @ age 65.   Hypertension Child    Breast cancer Neg Hx    Social History   Socioeconomic History   Marital status: Widowed    Spouse name: Not on file   Number of children:  Not on file   Years of education: Not on file   Highest education level: Not on file  Occupational History   Not on file  Tobacco Use   Smoking status: Never    Passive exposure: Yes   Smokeless tobacco: Never   Tobacco comments:    husband and son smoked in her home.  Vaping Use   Vaping Use: Never used  Substance and Sexual Activity   Alcohol use: Not Currently    Alcohol/week: 0.0 standard drinks of alcohol   Drug use: No   Sexual activity: Never  Other Topics Concern   Not on file  Social History Narrative   Widowed.   Disabled (colon/lung CA), retired.   Lives in [place] with her dtr, grand-dtr, and son.   Active.   Gets regular exercise, walks.   Right handed    Social Determinants of Health   Financial Resource Strain: Low Risk  (11/29/2021)   Overall Financial Resource Strain (CARDIA)    Difficulty of Paying Living Expenses: Not hard at all  Food Insecurity: No Food Insecurity (09/10/2022)   Hunger Vital Sign    Worried About Running Out of Food in the Last Year: Never true    Ran Out of Food in the Last Year: Never true  Transportation Needs: No Transportation Needs (09/10/2022)   PRAPARE - Administrator, Civil Service (Medical): No    Lack of Transportation (Non-Medical): No  Physical Activity: Not on file  Stress: No Stress Concern Present (11/29/2021)   [employer] of Occupational Health - Occupational Stress Questionnaire    Feeling of Stress : Only a little  Social Connections: Unknown (11/29/2021)   Social Connection and Isolation Panel [NHANES]    Frequency of Communication with Friends and Family: More than three times a week    Frequency of Social Gatherings with Friends and Family: More than three times a week    Attends Religious Services: Not on Marketing executive or Organizations: Not on file    Attends Banker Meetings: Not on file    Marital Status: Not on file     Review of Systems   Constitutional:  Negative for appetite change and unexpected weight change.  HENT:  Negative for congestion and sinus pressure.   Respiratory:  Negative for cough and chest tightness.        Breathing appears to be overall stable.   Cardiovascular:  Negative for chest pain and palpitations.       Leg swelling.  Gastrointestinal:  Negative for abdominal pain, diarrhea, nausea and vomiting.  Genitourinary:  Negative for difficulty urinating and dysuria.  Musculoskeletal:  Negative for joint swelling and myalgias.  Skin:  Negative for color change and rash.  Neurological:  Negative for dizziness and headaches.  Psychiatric/Behavioral:  Negative for agitation and dysphoric mood.        Objective:     BP 128/70   Pulse 72   Temp 98 F (36.7 C)   Resp 16   Ht 5\' 3"  (1.6 m)   Wt 166 lb (75.3 kg)   SpO2 98%   BMI 29.41 kg/m  Wt Readings from Last 3 Encounters:  12/14/22 166 lb (75.3 kg)  11/22/22 167 lb 9.6 oz (76 kg)  10/05/22 157 lb 9.6 oz (71.5 kg)    Physical Exam Vitals reviewed.  Constitutional:      General: She is not in acute distress.    Appearance: Normal appearance.  HENT:     Head: Normocephalic and atraumatic.     Right Ear: External ear normal.     Left Ear: External ear normal.  Eyes:     General: No scleral icterus.       Right eye: No discharge.        Left eye: No discharge.     Conjunctiva/sclera: Conjunctivae normal.  Neck:     Thyroid: No thyromegaly.  Cardiovascular:     Rate and Rhythm: Normal rate and regular rhythm.  Pulmonary:     Effort: No respiratory distress.     Breath sounds: Normal breath sounds. No wheezing.  Abdominal:     General: Bowel sounds are normal.     Palpations: Abdomen is soft.     Tenderness: There is no abdominal tenderness.  Musculoskeletal:        General: No tenderness.     Cervical back: Neck supple. No tenderness.     Comments: Lower extremity edema.    Lymphadenopathy:     Cervical: No cervical  adenopathy.  Skin:    Findings: No erythema or rash.  Neurological:     Mental Status: She is alert.  Psychiatric:        Mood and Affect: Mood normal.        Behavior: Behavior normal.      Outpatient Encounter Medications as of 12/14/2022  Medication Sig   ALPRAZolam (XANAX) 0.5 MG tablet TAKE 1/2 TABLET BY MOUTH ONCE DAILY AS NEEDED   aspirin EC 81 MG tablet Take 81 mg by mouth as needed.    Carbidopa-Levodopa ER (SINEMET CR) 25-100 MG tablet controlled release 2 at 7am/1 at 11am/1 at 4pm and 2 at BED   cholecalciferol (VITAMIN D3) 25 MCG (1000 UNIT) tablet Take 1,000 Units by mouth daily.   citalopram (CELEXA) 10 MG tablet Take 1 tablet (10 mg total) by mouth daily.   fluticasone-salmeterol (ADVAIR HFA) 115-21 MCG/ACT inhaler Inhale 2 puffs into the lungs 2 (two) times daily.   furosemide (LASIX) 20 MG tablet Take 1 tablet (20 mg total) by mouth daily.   montelukast (SINGULAIR) 10 MG tablet TAKE 1 TABLET BY MOUTH AT BEDTIME   PROAIR HFA 108 (90 Base) MCG/ACT inhaler INHALE 2 PUFFS BY MOUTH INTO THE LUNGS EVERY 6 HOURS AS NEEDED FOR WHEEZING   rosuvastatin (CRESTOR) 5 MG tablet TAKE 1 TABLET BY MOUTH EVERY MONDAY, WEDNESDAY AND FRIDAY   Spacer/Aero-Holding Chambers DEVI 1 Units by Does not apply route daily.   traZODone (DESYREL) 50 MG tablet TAKE 2 TABLETS  BY MOUTH AT BEDTIME   vitamin B-12 (CYANOCOBALAMIN) 100 MCG tablet Take 100 mcg by mouth daily.   No facility-administered encounter medications on file as of 12/14/2022.     Lab Results  Component Value Date   WBC 8.8 12/14/2022   HGB 11.8 (L) 12/14/2022   HCT 37.1 12/14/2022   PLT 241.0 12/14/2022   GLUCOSE 92 12/14/2022   CHOL 199 10/25/2022   TRIG 83.0 10/25/2022   HDL 61.50 10/25/2022   LDLDIRECT 163.4 12/31/2012   LDLCALC 121 (H) 10/25/2022   ALT 3 12/14/2022   AST 11 12/14/2022   NA 140 12/14/2022   K 4.1 12/14/2022   CL 103 12/14/2022   CREATININE 0.92 12/14/2022   BUN 19 12/14/2022   CO2 28 12/14/2022    TSH 1.22 12/14/2022   INR 1.0 12/12/2012   HGBA1C 6.0 10/25/2022    NM Bone Scan 3 Phase  Result Date: 09/21/2022 CLINICAL DATA:  LEFT knee arthroplasty EXAM: NUCLEAR MEDICINE 3-PHASE BONE SCAN TECHNIQUE: Radionuclide angiographic images, immediate static blood pool images, and 3-hour delayed static images were obtained of the knees after intravenous injection of radiopharmaceutical. RADIOPHARMACEUTICALS:  21.0 mCi Tc-54m MDP IV COMPARISON:  None Available. FINDINGS: Vascular phase: No asymmetric or increased blood flow to the LEFT or RIGHT knee. Blood pool phase: Mild asymmetric blood pool activity surrounding the LEFT knee joint. Delayed phase: Focal radiotracer activity accumulates within the LEFT patella. No abnormal radiotracer accumulation within the LEFT tibial plateau or femoral condyles. Photopenia noted associated with the LEFT knee prosthetic. Mild degenerative uptake in the medial LEFT knee compartment. IMPRESSION: 1. No evidence of loosening infection of the LEFT knee prosthetic. 2. Focal delayed uptake in the LEFT patella. Electronically Signed   By: Genevive Bi M.D.   On: 09/21/2022 10:57       Assessment & Plan:  Dysuria Assessment & Plan: Check urine to confirm no infection.  States has noticed some burning with urination.   Orders: -     Urinalysis, Routine w reflex microscopic -     Urine Culture  Anemia, unspecified type Assessment & Plan: Check cbc today.   Orders: -     CBC with Differential/Platelet -     IBC + Ferritin  Stage 3 chronic kidney disease, unspecified whether stage 3a or 3b CKD (HCC) Assessment & Plan: Avoid antiinflammatories.  Off micardis due to low blood pressure.  Reports blood pressure varying.  Blood pressure ok today.  Taking lasix prn. Follow pressures. Follow metabolic panel.   Orders: -     Basic metabolic panel  Primary hypertension -     Hepatic function panel  Lower extremity edema Assessment & Plan: Increased swelling as  outlined.   Start back wearing compression hose daily - on in am and off at night.  Discussed importance of leg elevation.  Avoid increased sodium intake.  Follow.  Discussed referral to vascular for evaluation - question of need for UNNA wraps, lymphedema pump, etc.  Notify if agreeable for referral.   Orders: -     TSH  Gastroesophageal reflux disease without esophagitis Assessment & Plan: Upper symptoms appear to be controlled on prilosec.  Follow.    Anxiety, mild Assessment & Plan: Continue celexa.  Follow.    Aortic atherosclerosis (HCC) Assessment & Plan: Crestor.    Bilateral carotid artery disease, unspecified type (HCC) Assessment & Plan: Previously evaluated by [facility].  Needs to continue crestor.     Depression, recurrent (HCC) Assessment & Plan: Continue citalopram.  Follow. Doing better.    Fibromuscular dysplasia (HCC) Assessment & Plan: Has been evaluated by [facility].  Continue risk factor modificaiton.    Hepatic steatosis Assessment & Plan: Diet and exercise.  Follow liver panel. Seen on chest CT angio - 09/11/21   History of colon cancer Assessment & Plan: Recent attempt at colonoscopy - poor prep.   GI recommended follow cbc and iron studies.  Hgb 11.4 last check.  Discussed referral back to GI.  See result note.  She previously declined.    History of malignant neoplasm metastatic to lung Assessment & Plan: S/p lobectomy.  Followed by pulmonary.  Albuterol inhaler if needed.    Hypercholesterolemia Assessment & Plan: Crestor.   Low cholesterol diet and exercise. Follow lipid panel and liver function tests.    Hyperglycemia Assessment & Plan: Low carb diet and exercise.  Follow met b and a1c.    Major depressive disorder with single episode, remission status unspecified Assessment & Plan: On citalopram.  Feels she is doing well on this medication.  Follow.     Parkinson's disease, unspecified whether dyskinesia present, unspecified whether  manifestations fluctuate Assessment & Plan: Seeing Dr Tat.  On sinemet - taking as outlined.  Has been doing better overall.  Follow.    Pure hypercholesterolemia Assessment & Plan: Crestor. Low cholesterol diet and exercise.  Follow lipid panel and liver function tests.       Dale Richton, MD

## 2022-12-16 ENCOUNTER — Encounter: Payer: Self-pay | Admitting: Internal Medicine

## 2022-12-16 LAB — URINE CULTURE
MICRO NUMBER:: 15164810
SPECIMEN QUALITY:: ADEQUATE

## 2022-12-16 NOTE — Assessment & Plan Note (Signed)
S/p lobectomy.  Followed by pulmonary.  Albuterol inhaler if needed.  

## 2022-12-16 NOTE — Assessment & Plan Note (Signed)
Crestor. Low cholesterol diet and exercise.  Follow lipid panel and liver function tests.  

## 2022-12-16 NOTE — Assessment & Plan Note (Signed)
Continue citalopram.   Follow. Doing better.  

## 2022-12-16 NOTE — Assessment & Plan Note (Signed)
Low carb diet and exercise.  Follow met b and a1c.   

## 2022-12-16 NOTE — Assessment & Plan Note (Signed)
Continue celexa.  Follow.

## 2022-12-16 NOTE — Assessment & Plan Note (Signed)
Previously evaluated by [facility].  Needs to continue crestor.   

## 2022-12-16 NOTE — Assessment & Plan Note (Signed)
Check urine to confirm no infection.  States has noticed some burning with urination.

## 2022-12-16 NOTE — Assessment & Plan Note (Signed)
Recent attempt at colonoscopy - poor prep.   GI recommended follow cbc and iron studies.  Hgb 11.4 last check.  Discussed referral back to GI.  See result note.  She previously declined.

## 2022-12-16 NOTE — Assessment & Plan Note (Signed)
Seeing Dr Tat.  On sinemet - taking as outlined.  Has been doing better overall.  Follow.  

## 2022-12-16 NOTE — Assessment & Plan Note (Signed)
Diet and exercise.  Follow liver panel. Seen on chest CT angio - 09/11/21 

## 2022-12-16 NOTE — Assessment & Plan Note (Addendum)
On citalopram.  Feels she is doing well on this medication.  Follow.

## 2022-12-16 NOTE — Assessment & Plan Note (Signed)
Increased swelling as outlined.   Start back wearing compression hose daily - on in am and off at night.  Discussed importance of leg elevation.  Avoid increased sodium intake.  Follow.  Discussed referral to vascular for evaluation - question of need for UNNA wraps, lymphedema pump, etc.  Notify if agreeable for referral.

## 2022-12-16 NOTE — Assessment & Plan Note (Signed)
Avoid antiinflammatories.  Off micardis due to low blood pressure.  Reports blood pressure varying.  Blood pressure ok today.  Taking lasix prn. Follow pressures. Follow metabolic panel.

## 2022-12-16 NOTE — Assessment & Plan Note (Signed)
-   Crestor 

## 2022-12-16 NOTE — Assessment & Plan Note (Signed)
Has been evaluated by [facility].  Continue risk factor modificaiton.  

## 2022-12-16 NOTE — Assessment & Plan Note (Signed)
Check cbc today 

## 2022-12-16 NOTE — Assessment & Plan Note (Signed)
Upper symptoms appear to be controlled on prilosec.  Follow.  

## 2022-12-18 ENCOUNTER — Other Ambulatory Visit: Payer: Self-pay

## 2022-12-18 MED ORDER — AMOXICILLIN-POT CLAVULANATE 500-125 MG PO TABS: 1.0000 | ORAL_TABLET | Freq: Two times a day (BID) | ORAL | 0 refills | Status: DC

## 2022-12-20 ENCOUNTER — Ambulatory Visit (INDEPENDENT_AMBULATORY_CARE_PROVIDER_SITE_OTHER): Payer: 59 | Admitting: Physician Assistant

## 2022-12-20 ENCOUNTER — Encounter: Payer: Self-pay | Admitting: Physician Assistant

## 2022-12-20 VITALS — BP 116/67 | HR 69 | Temp 97.6°F | Ht 63.0 in | Wt 164.8 lb

## 2022-12-20 DIAGNOSIS — Z85038 Personal history of other malignant neoplasm of large intestine: Secondary | ICD-10-CM | POA: Diagnosis not present

## 2022-12-20 DIAGNOSIS — D649 Anemia, unspecified: Secondary | ICD-10-CM

## 2022-12-20 DIAGNOSIS — Z86010 Personal history of colonic polyps: Secondary | ICD-10-CM

## 2022-12-20 MED ORDER — PEG 3350-KCL-NA BICARB-NACL 420 G PO SOLR: 4000.0000 mL | Freq: Once | ORAL | 0 refills | Status: AC

## 2022-12-20 NOTE — Progress Notes (Signed)
Celso Amy, PA-C [address]  [address]  [place], [place] [address]  Main: [phone]  Fax: [phone]   Gastroenterology Consultation  Referring Provider:     Dale Madras, MD Primary Care Physician:  Dale Loleta, MD Primary Gastroenterologist:  Celso Amy, PA-C / Dr. Lannette Donath    Reason for Consultation:     History of colon cancer; anemia        HPI:   DAURICE OVANDO is a 72 y.o. y/o female referred for consultation & management  by Dale Friendship, MD.    Previous patient of Dr. Tobi Bastos.  Last colonoscopy by Dr. Tobi Bastos in 2021.  Patient requested to switch to Dr. Allegra Lai which was approved by both Dr. Tobi Bastos and Dr. Allegra Lai.  Patient is requesting to have a female doctor.  [facility] in 2022 and 2023.  She was initially diagnosed with sigmoid colon cancer in 1999.  Underwent partial colectomy.  History of recurrent adenomatous colon polyps.  She has had multiple colonoscopies 2000, 2004, 2007, 2009, 2012, 2015, 2016, 2019.  Last colonoscopy 11/2019 by Dr. Tobi Bastos showed excellent prep.  2 small 4 mm and 5 mm tubular adenoma polyps removed from sigmoid and ascending colon.  5-year repeat.  Numerous EGDs.  Last EGD 03/2021 by Dr. Mia Creek showed mild gastritis, otherwise normal.  EGD was somewhat difficult due to patient's oxygen desaturation.  Biopsies negative for H. pylori.  Lab 12/14/2022 showed mild normocytic anemia with hemoglobin 11.8, hematocrit 37, MCV 89.  Normal CMP.  Normal iron 69, iron saturation 23%, ferritin 61.  She denies any current GI symptoms.  Denies abdominal pain, diarrhea, constipation, rectal bleeding, or unintentional weight loss.  Currently takes Sinemet for Parkinson's.  He is here today with her son.  She has history of COPD treated with inhalers.  She is not on oxygen.  Walks with no assistive devices.  Past Medical History:  Diagnosis Date   Anxiety    Asthma    Atypical chest pain    a. 2003 Cath:  reportedly nl per pt [name]);  b. 04/2015 [facility] Admission, neg troponin.   Colon cancer (HCC) 1999   a. 1999: Pt had partial colectomy. Did develop lung metastasis. Had right upper lobectomy in 01/2007 then had associated chemotherapy.   COPD (chronic obstructive pulmonary disease) (HCC)    DDD (degenerative disc disease), lumbosacral    Depression    Echocardiogram abnormal    a. 02/2010 Echo: EF 55-60%, mild LAE, no regional wall motion abnormalities   Elevated transaminase level    a. ? NASH;  b. 06/2015 - nl LFTs.   GERD (gastroesophageal reflux disease)    Hematuria    Holter monitor, abnormal    a. 12/2009 Holter monitoring: NSR, rare PACs and PVCs.   Hypercholesterolemia    Hyperlipidemia    Hypertension    Leaky heart valve    a. Per pt report - no evidence of valvular abnormalities on prior echoes.   Lesion of skin of Right breast 10/20/2017   Lung metastases 2008   a. S/P resection (lobectomy - right)   Morbid obesity (HCC)    Nephrolithiasis    pt denies   Parkinson disease 08/2016   [name]    Past Surgical History:  Procedure Laterality Date   BREAST EXCISIONAL BIOPSY Right 10/07/2009   benign   CARDIAC CATHETERIZATION  2003   negative as per pt report   CHOLECYSTECTOMY  2000   COLON SURGERY  Colon cancer, removed part of large colon   COLONOSCOPY N/A 04/25/2015   Procedure: COLONOSCOPY;  Surgeon: Scot Jun, MD;  Location: [facility] ENDOSCOPY;  Service: Endoscopy;  Laterality: N/A;   COLONOSCOPY WITH PROPOFOL N/A 06/14/2017   Procedure: COLONOSCOPY WITH PROPOFOL;  Surgeon: Scot Jun, MD;  Location: [facility] ENDOSCOPY;  Service: Endoscopy;  Laterality: N/A;   COLONOSCOPY WITH PROPOFOL N/A 11/17/2019   Procedure: COLONOSCOPY WITH PROPOFOL;  Surgeon: Wyline Mood, MD;  Location: [facility] ENDOSCOPY;  Service: Gastroenterology;  Laterality: N/A;   COLONOSCOPY WITH PROPOFOL N/A 03/21/2021   Procedure: COLONOSCOPY WITH PROPOFOL;  Surgeon: Regis Bill, MD;   Location: [facility] ENDOSCOPY;  Service: Endoscopy;  Laterality: N/A;   ESOPHAGOGASTRODUODENOSCOPY (EGD) WITH PROPOFOL N/A 03/21/2021   Procedure: ESOPHAGOGASTRODUODENOSCOPY (EGD) WITH PROPOFOL;  Surgeon: Regis Bill, MD;  Location: [facility] ENDOSCOPY;  Service: Endoscopy;  Laterality: N/A;   LOBECTOMY  01/2007   Right lung, upper lobe right side removed   TOTAL ABDOMINAL HYSTERECTOMY     abnormal bleeding   TUBAL LIGATION  1980    Prior to Admission medications   Medication Sig Start Date End Date Taking? Authorizing Provider  ALPRAZolam Prudy Feeler) 0.5 MG tablet TAKE 1/2 TABLET BY MOUTH ONCE DAILY AS NEEDED 12/03/22   Dale Merrill, MD  amoxicillin-clavulanate (AUGMENTIN) 500-125 MG tablet Take 1 tablet by mouth 2 (two) times daily. 12/18/22   Dale McClelland, MD  aspirin EC 81 MG tablet Take 81 mg by mouth as needed.     [provider]  Carbidopa-Levodopa ER (SINEMET CR) 25-100 MG tablet controlled release 2 at 7am/1 at 11am/1 at 4pm and 2 at BED 07/23/22   Tat, Octaviano Batty, DO  cholecalciferol (VITAMIN D3) 25 MCG (1000 UNIT) tablet Take 1,000 Units by mouth daily.    [provider]  citalopram (CELEXA) 10 MG tablet Take 1 tablet (10 mg total) by mouth daily. 09/30/22   Dale Franklin, MD  fluticasone-salmeterol (ADVAIR HFA) 962-95 MCG/ACT inhaler Inhale 2 puffs into the lungs 2 (two) times daily. 10/15/19   Salena Saner, MD  furosemide (LASIX) 20 MG tablet Take 1 tablet (20 mg total) by mouth daily. 05/20/22 05/20/23  Fisher, Roselyn Bering, PA-C  montelukast (SINGULAIR) 10 MG tablet TAKE 1 TABLET BY MOUTH AT BEDTIME 10/17/22   Dale Le Grand, MD  PROAIR HFA 108 [phone] Base) MCG/ACT inhaler INHALE 2 PUFFS BY MOUTH INTO THE LUNGS EVERY 6 HOURS AS NEEDED FOR WHEEZING 11/20/19   Dale Henderson, MD  rosuvastatin (CRESTOR) 5 MG tablet TAKE 1 TABLET BY MOUTH EVERY MONDAY, [facility] AND FRIDAY 03/15/22   Dale Turin, MD  Spacer/Aero-Holding Chambers DEVI 1 Units by Does not apply route  daily. 11/27/22   Allegra Grana, FNP  traZODone (DESYREL) 50 MG tablet TAKE 2 TABLETS BY MOUTH AT BEDTIME 10/17/22   Dale , MD  vitamin B-12 (CYANOCOBALAMIN) 100 MCG tablet Take 100 mcg by mouth daily.    [provider]    Family History  Problem Relation Age of Onset   Coronary artery disease Mother        died @ 62 of MI.   Mental illness Mother    Diabetes Mother    Heart attack Father 44       MI   Cancer Father        lung - died in his mid-70's   Hypertension Father    Diabetes Father    Sudden death Brother    Mental illness Brother    Heart  attack Brother        MI @ 12, died @ age 22.   Hypertension Child    Breast cancer Neg Hx      Social History   Tobacco Use   Smoking status: Never    Passive exposure: Yes   Smokeless tobacco: Never   Tobacco comments:    husband and son smoked in her home.  Vaping Use   Vaping status: Never Used  Substance Use Topics   Alcohol use: Not Currently    Alcohol/week: 0.0 standard drinks of alcohol   Drug use: No    Allergies as of 12/20/2022 - Review Complete 12/20/2022  Allergen Reaction Noted   Macrobid [nitrofurantoin macrocrystal] Shortness Of Breath and Palpitations 04/24/2012   Antihistamines, diphenhydramine-type Other (See Comments) 10/18/2014   Aspirin Other (See Comments) 10/04/2014   Ceftin [cefuroxime axetil] Other (See Comments) 04/24/2012   Celecoxib Other (See Comments) 10/18/2014   Cymbalta [duloxetine hcl] Other (See Comments) 04/24/2012   Diphenhydramine Other (See Comments) 04/22/2015   Escitalopram Other (See Comments) 04/22/2015   Lexapro [escitalopram oxalate] Other (See Comments) 02/19/2013   Metronidazole Other (See Comments) 10/18/2014   Nitrofurantoin Other (See Comments) 11/16/2014   Zoloft [sertraline hcl]  04/24/2012   Ciprofloxacin Other (See Comments) 12/31/2012    Review of Systems:    All systems reviewed and negative except where noted in HPI.   Physical  Exam:  BP 116/67   Pulse 69   Temp 97.6 F (36.4 C)   Ht 5\' 3"  (1.6 m)   Wt 164 lb 12.8 oz (74.8 kg)   BMI 29.19 kg/m  No LMP recorded. Patient has had a hysterectomy. Psych:  Alert and cooperative.  Elderly female.  Normal mood and affect.  Answers questions appropriately. General:   Alert,  Well-developed, well-nourished, pleasant and cooperative in NAD Head:  Normocephalic and atraumatic.  She is able to get on and off exam table with my help.  She does not use any walking devices. Eyes:  Sclera clear, no icterus.   Conjunctiva pink. Neck:  Supple; no masses or thyromegaly. Lungs:  Respirations even and unlabored.  Clear throughout to auscultation.   No wheezes, crackles, or rhonchi. No acute distress. Heart:  Regular rate and rhythm; no murmurs, clicks, rubs, or gallops. Abdomen:  Normal bowel sounds.  No bruits.  Soft, and non-distended without masses, hepatosplenomegaly or hernias noted.  No Tenderness.  No guarding or rebound tenderness.    Neurologic:  Alert and oriented x3;  grossly normal neurologically.  I do not visualize any parkinsonian movements.  Talks with normal speech. Psych:  Alert and cooperative. Normal mood and affect.  Imaging Studies: No results found.  Assessment and Plan:   BRIDNEY GUADARRAMA is a 72 y.o. y/o female has been referred for chronic normocytic anemia and history of colon cancer.  Recent iron studies normal.  Originally diagnosed with sigmoid colon cancer in 1999 s/p partial colectomy.  Numerous repeat colonoscopy since then.  She has had a few recurrent adenomatous colon polyps.  I am scheduling repeat surveillance colonoscopy.  1.  Chronic anemia; normal iron 2.  History of colon cancer 3.  History of adenomatous colon polyps 4.  COPD -not on oxygen; appears stable to undergo repeat colonoscopy.  Plan:   Scheduling Colonoscopy I discussed risks of colonoscopy with patient to include risk of bleeding, colon perforation, and risk of sedation.   Patient expressed understanding and agrees to proceed with colonoscopy.  Golytely Prep.   Follow  up as needed.  Celso Amy, PA-C

## 2022-12-21 ENCOUNTER — Encounter: Payer: Self-pay | Admitting: Neurology

## 2023-01-07 ENCOUNTER — Ambulatory Visit (INDEPENDENT_AMBULATORY_CARE_PROVIDER_SITE_OTHER): Payer: 59 | Admitting: *Deleted

## 2023-01-07 VITALS — Ht 63.0 in | Wt 166.0 lb

## 2023-01-07 DIAGNOSIS — Z0000 Encounter for general adult medical examination without abnormal findings: Secondary | ICD-10-CM | POA: Diagnosis not present

## 2023-01-07 NOTE — Patient Instructions (Signed)
Victoria Hanson , Thank you for taking time to come for your Medicare Wellness Visit. I appreciate your ongoing commitment to your health goals. Please review the following plan we discussed and let me know if I can assist you in the future.   Referrals/Orders/Follow-Ups/Clinician Recommendations: None  This is a list of the screening recommended for you and due dates:  Health Maintenance  Topic Date Due   COVID-19 Vaccine (3 - Moderna risk series) 01/30/2020   Zoster (Shingles) Vaccine (1 of 2) 03/16/2023*   Mammogram  12/14/2023*   Flu Shot  01/10/2023   Medicare Annual Wellness Visit  01/07/2024   Colon Cancer Screening  03/21/2026   DTaP/Tdap/Td vaccine (2 - Td or Tdap) 01/07/2030   Pneumonia Vaccine  Completed   DEXA scan (bone density measurement)  Completed   Hepatitis C Screening  Completed   HPV Vaccine  Aged Out  *Topic was postponed. The date shown is not the original due date.    Advanced directives: (In Chart) A copy of your advanced directives are scanned into your chart should your provider ever need it.  Next Medicare Annual Wellness Visit scheduled for next year: Yes 01/08/24 @ 11:00  Preventive Care 65 Years and Older, Female Preventive care refers to lifestyle choices and visits with your health care provider that can promote health and wellness. What does preventive care include? A yearly physical exam. This is also called an annual well check. Dental exams once or twice a year. Routine eye exams. Ask your health care provider how often you should have your eyes checked. Personal lifestyle choices, including: Daily care of your teeth and gums. Regular physical activity. Eating a healthy diet. Avoiding tobacco and drug use. Limiting alcohol use. Practicing safe sex. Taking low-dose aspirin every day. Taking vitamin and mineral supplements as recommended by your health care provider. What happens during an annual well check? The services and screenings done by  your health care provider during your annual well check will depend on your age, overall health, lifestyle risk factors, and family history of disease. Counseling  Your health care provider may ask you questions about your: Alcohol use. Tobacco use. Drug use. Emotional well-being. Home and relationship well-being. Sexual activity. Eating habits. History of falls. Memory and ability to understand (cognition). Work and work Astronomer. Reproductive health. Screening  You may have the following tests or measurements: Height, weight, and BMI. Blood pressure. Lipid and cholesterol levels. These may be checked every 5 years, or more frequently if you are over 43 years old. Skin check. Lung cancer screening. You may have this screening every year starting at age 67 if you have a 30-pack-year history of smoking and currently smoke or have quit within the past 15 years. Fecal occult blood test (FOBT) of the stool. You may have this test every year starting at age 5. Flexible sigmoidoscopy or colonoscopy. You may have a sigmoidoscopy every 5 years or a colonoscopy every 10 years starting at age 45. Hepatitis C blood test. Hepatitis B blood test. Sexually transmitted disease (STD) testing. Diabetes screening. This is done by checking your blood sugar (glucose) after you have not eaten for a while (fasting). You may have this done every 1-3 years. Bone density scan. This is done to screen for osteoporosis. You may have this done starting at age 5. Mammogram. This may be done every 1-2 years. Talk to your health care provider about how often you should have regular mammograms. Talk with your health care provider about  your test results, treatment options, and if necessary, the need for more tests. Vaccines  Your health care provider may recommend certain vaccines, such as: Influenza vaccine. This is recommended every year. Tetanus, diphtheria, and acellular pertussis (Tdap, Td) vaccine. You  may need a Td booster every 10 years. Zoster vaccine. You may need this after age 1. Pneumococcal 13-valent conjugate (PCV13) vaccine. One dose is recommended after age 92. Pneumococcal polysaccharide (PPSV23) vaccine. One dose is recommended after age 32. Talk to your health care provider about which screenings and vaccines you need and how often you need them. This information is not intended to replace advice given to you by your health care provider. Make sure you discuss any questions you have with your health care provider. Document Released: 06/24/2015 Document Revised: 02/15/2016 Document Reviewed: 03/29/2015 Elsevier Interactive Patient Education  2017 [employer].  Fall Prevention in the Home Falls can cause injuries. They can happen to people of all ages. There are many things you can do to make your home safe and to help prevent falls. What can I do on the outside of my home? Regularly fix the edges of walkways and driveways and fix any cracks. Remove anything that might make you trip as you walk through a door, such as a raised step or threshold. Trim any bushes or trees on the path to your home. Use bright outdoor lighting. Clear any walking paths of anything that might make someone trip, such as rocks or tools. Regularly check to see if handrails are loose or broken. Make sure that both sides of any steps have handrails. Any raised decks and porches should have guardrails on the edges. Have any leaves, snow, or ice cleared regularly. Use sand or salt on walking paths during winter. Clean up any spills in your garage right away. This includes oil or grease spills. What can I do in the bathroom? Use night lights. Install grab bars by the toilet and in the tub and shower. Do not use towel bars as grab bars. Use non-skid mats or decals in the tub or shower. If you need to sit down in the shower, use a plastic, non-slip stool. Keep the floor dry. Clean up any water that spills  on the floor as soon as it happens. Remove soap buildup in the tub or shower regularly. Attach bath mats securely with double-sided non-slip rug tape. Do not have throw rugs and other things on the floor that can make you trip. What can I do in the bedroom? Use night lights. Make sure that you have a light by your bed that is easy to reach. Do not use any sheets or blankets that are too big for your bed. They should not hang down onto the floor. Have a firm chair that has side arms. You can use this for support while you get dressed. Do not have throw rugs and other things on the floor that can make you trip. What can I do in the kitchen? Clean up any spills right away. Avoid walking on wet floors. Keep items that you use a lot in easy-to-reach places. If you need to reach something above you, use a strong step stool that has a grab bar. Keep electrical cords out of the way. Do not use floor polish or wax that makes floors slippery. If you must use wax, use non-skid floor wax. Do not have throw rugs and other things on the floor that can make you trip. What can I do with  my stairs? Do not leave any items on the stairs. Make sure that there are handrails on both sides of the stairs and use them. Fix handrails that are broken or loose. Make sure that handrails are as long as the stairways. Check any carpeting to make sure that it is firmly attached to the stairs. Fix any carpet that is loose or worn. Avoid having throw rugs at the top or bottom of the stairs. If you do have throw rugs, attach them to the floor with carpet tape. Make sure that you have a light switch at the top of the stairs and the bottom of the stairs. If you do not have them, ask someone to add them for you. What else can I do to help prevent falls? Wear shoes that: Do not have high heels. Have rubber bottoms. Are comfortable and fit you well. Are closed at the toe. Do not wear sandals. If you use a stepladder: Make  sure that it is fully opened. Do not climb a closed stepladder. Make sure that both sides of the stepladder are locked into place. Ask someone to hold it for you, if possible. Clearly mark and make sure that you can see: Any grab bars or handrails. First and last steps. Where the edge of each step is. Use tools that help you move around (mobility aids) if they are needed. These include: Canes. Walkers. Scooters. Crutches. Turn on the lights when you go into a dark area. Replace any light bulbs as soon as they burn out. Set up your furniture so you have a clear path. Avoid moving your furniture around. If any of your floors are uneven, fix them. If there are any pets around you, be aware of where they are. Review your medicines with your doctor. Some medicines can make you feel dizzy. This can increase your chance of falling. Ask your doctor what other things that you can do to help prevent falls. This information is not intended to replace advice given to you by your health care provider. Make sure you discuss any questions you have with your health care provider. Document Released: 03/24/2009 Document Revised: 11/03/2015 Document Reviewed: 07/02/2014 Elsevier Interactive Patient Education  2017 [employer].

## 2023-01-07 NOTE — Progress Notes (Signed)
Subjective:   LISSANDRA GRADOWSKI is a 72 y.o. female who presents for Medicare Annual (Subsequent) preventive examination.  Visit Complete: Virtual  I connected with  AZYIAH SALLEE on 01/07/23 by a audio enabled telemedicine application and verified that I am speaking with the correct person using two identifiers.  Patient Location: Home  Provider Location: Office/Clinic  I discussed the limitations of evaluation and management by telemedicine. The patient expressed understanding and agreed to proceed.   Vital Signs: Unable to obtain new vitals due to this being a telehealth visit.   Review of Systems     Cardiac Risk Factors include: advanced age (>87men, >40 women);hypertension;dyslipidemia;Other (see comment), Risk factor comments: CAD     Objective:    Today's Vitals   01/07/23 1045  Weight: 166 lb (75.3 kg)  Height: 5\' 3"  (1.6 m)  PainSc: 6    Body mass index is 29.41 kg/m.     01/07/2023   11:05 AM 07/23/2022    8:22 AM 07/12/2022   10:06 AM 05/20/2022   12:19 PM 11/29/2021   10:57 AM 11/21/2021    5:29 AM 11/20/2021    8:16 PM  Advanced Directives  Does Patient Have a Medical Advance Directive? Yes Yes Yes No Yes No No  Type of Estate agent of [place];Living will Living will;Healthcare Power of [employer] Power of Attorney    Does patient want to make changes to medical advance directive? No - Patient declined  No - Patient declined  No - Patient declined    Copy of Healthcare Power of Attorney in Chart? Yes - validated most recent copy scanned in chart (See row information)    Yes - validated most recent copy scanned in chart (See row information)    Would patient like information on creating a medical advance directive?    No - Patient declined  No - Patient declined     Current Medications (verified) Outpatient Encounter Medications as of 01/07/2023  Medication Sig   ALPRAZolam (XANAX) 0.5 MG tablet TAKE 1/2 TABLET BY MOUTH  ONCE DAILY AS NEEDED   aspirin EC 81 MG tablet Take 81 mg by mouth as needed.    Carbidopa-Levodopa ER (SINEMET CR) 25-100 MG tablet controlled release 2 at 7am/1 at 11am/1 at 4pm and 2 at BED   cholecalciferol (VITAMIN D3) 25 MCG (1000 UNIT) tablet Take 1,000 Units by mouth daily.   citalopram (CELEXA) 10 MG tablet Take 1 tablet (10 mg total) by mouth daily.   fluticasone-salmeterol (ADVAIR HFA) 115-21 MCG/ACT inhaler Inhale 2 puffs into the lungs 2 (two) times daily.   furosemide (LASIX) 20 MG tablet Take 1 tablet (20 mg total) by mouth daily.   montelukast (SINGULAIR) 10 MG tablet TAKE 1 TABLET BY MOUTH AT BEDTIME   PROAIR HFA 108 (90 Base) MCG/ACT inhaler INHALE 2 PUFFS BY MOUTH INTO THE LUNGS EVERY 6 HOURS AS NEEDED FOR WHEEZING   rosuvastatin (CRESTOR) 5 MG tablet TAKE 1 TABLET BY MOUTH EVERY MONDAY, WEDNESDAY AND FRIDAY   Spacer/Aero-Holding Chambers DEVI 1 Units by Does not apply route daily.   traZODone (DESYREL) 50 MG tablet TAKE 2 TABLETS BY MOUTH AT BEDTIME   vitamin B-12 (CYANOCOBALAMIN) 100 MCG tablet Take 100 mcg by mouth daily.   vitamin E 180 MG (400 UNITS) capsule Take 400 Units by mouth daily.   [DISCONTINUED] amoxicillin-clavulanate (AUGMENTIN) 500-125 MG tablet Take 1 tablet by mouth 2 (two) times daily. (Patient not taking: Reported on 01/07/2023)  No facility-administered encounter medications on file as of 01/07/2023.    Allergies (verified) Macrobid [nitrofurantoin macrocrystal]; Antihistamines, diphenhydramine-type; Aspirin; Ceftin [cefuroxime axetil]; Celecoxib; Cymbalta [duloxetine hcl]; Diphenhydramine; Escitalopram; Lexapro [escitalopram oxalate]; Metronidazole; Nitrofurantoin; Zoloft [sertraline hcl]; and Ciprofloxacin   History: Past Medical History:  Diagnosis Date   Anxiety    Asthma    Atypical chest pain    a. 2003 Cath: reportedly nl per pt [name]);  b. 04/2015 [facility] Admission, neg troponin.   Colon cancer (HCC) 1999   a. 1999: Pt had partial  colectomy. Did develop lung metastasis. Had right upper lobectomy in 01/2007 then had associated chemotherapy.   COPD (chronic obstructive pulmonary disease) (HCC)    DDD (degenerative disc disease), lumbosacral    Depression    Echocardiogram abnormal    a. 02/2010 Echo: EF 55-60%, mild LAE, no regional wall motion abnormalities   Elevated transaminase level    a. ? NASH;  b. 06/2015 - nl LFTs.   GERD (gastroesophageal reflux disease)    Hematuria    Holter monitor, abnormal    a. 12/2009 Holter monitoring: NSR, rare PACs and PVCs.   Hypercholesterolemia    Hyperlipidemia    Hypertension    Leaky heart valve    a. Per pt report - no evidence of valvular abnormalities on prior echoes.   Lesion of skin of Right breast 10/20/2017   Lung metastases 2008   a. S/P resection (lobectomy - right)   Morbid obesity (HCC)    Nephrolithiasis    pt denies   Parkinson disease 08/2016   [name]   Past Surgical History:  Procedure Laterality Date   BREAST EXCISIONAL BIOPSY Right 10/07/2009   benign   CARDIAC CATHETERIZATION  2003   negative as per pt report   CHOLECYSTECTOMY  2000   COLON SURGERY     Colon cancer, removed part of large colon   COLONOSCOPY N/A 04/25/2015   Procedure: COLONOSCOPY;  Surgeon: Scot Jun, MD;  Location[facility] ENDOSCOPY;  Service: Endoscopy;  Laterality: N/A;   COLONOSCOPY WITH PROPOFOL N/A 06/14/2017   Procedure: COLONOSCOPY WITH PROPOFOL;  Surgeon: Scot Jun, MD;  Location: [facility] ENDOSCOPY;  Service: Endoscopy;  Laterality: N/A;   COLONOSCOPY WITH PROPOFOL N/A 11/17/2019   Procedure: COLONOSCOPY WITH PROPOFOL;  Surgeon: Wyline Mood, MD;  Location: [facility] ENDOSCOPY;  Service: Gastroenterology;  Laterality: N/A;   COLONOSCOPY WITH PROPOFOL N/A 03/21/2021   Procedure: COLONOSCOPY WITH PROPOFOL;  Surgeon: Regis Bill, MD;  Location: [facility] ENDOSCOPY;  Service: Endoscopy;  Laterality: N/A;   ESOPHAGOGASTRODUODENOSCOPY (EGD) WITH PROPOFOL N/A 03/21/2021    Procedure: ESOPHAGOGASTRODUODENOSCOPY (EGD) WITH PROPOFOL;  Surgeon: Regis Bill, MD;  Location: [facility] ENDOSCOPY;  Service: Endoscopy;  Laterality: N/A;   LOBECTOMY  01/2007   Right lung, upper lobe right side removed   REPLACEMENT TOTAL KNEE Left 08/2020   TOTAL ABDOMINAL HYSTERECTOMY     abnormal bleeding   TUBAL LIGATION  1980   Family History  Problem Relation Age of Onset   Coronary artery disease Mother        died @ 3 of MI.   Mental illness Mother    Diabetes Mother    Heart attack Father 40       MI   Cancer Father        lung - died in his mid-70's   Hypertension Father    Diabetes Father    Sudden death Brother    Mental illness Brother    Heart attack Brother  MI @ 56, died @ age 62.   Hypertension Child    Breast cancer Neg Hx    Social History   Socioeconomic History   Marital status: Widowed    Spouse name: Not on file   Number of children: Not on file   Years of education: Not on file   Highest education level: Not on file  Occupational History   Not on file  Tobacco Use   Smoking status: Never    Passive exposure: Yes   Smokeless tobacco: Never   Tobacco comments:    husband and son smoked in her home.  Vaping Use   Vaping status: Never Used  Substance and Sexual Activity   Alcohol use: Not Currently    Alcohol/week: 0.0 standard drinks of alcohol   Drug use: No   Sexual activity: Never  Other Topics Concern   Not on file  Social History Narrative   Widowed.   Disabled (colon/lung CA), retired.   Lives in [place] with her dtr, grand-dtr, and son.   Active.   Gets regular exercise, walks.   Right handed    Social Determinants of Health   Financial Resource Strain: Low Risk  (01/07/2023)   Overall Financial Resource Strain (CARDIA)    Difficulty of Paying Living Expenses: Not hard at all  Food Insecurity: No Food Insecurity (01/07/2023)   Hunger Vital Sign    Worried About Running Out of Food in the Last Year:  Never true    Ran Out of Food in the Last Year: Never true  Transportation Needs: No Transportation Needs (01/07/2023)   PRAPARE - Administrator, Civil Service (Medical): No    Lack of Transportation (Non-Medical): No  Physical Activity: Inactive (01/07/2023)   Exercise Vital Sign    Days of Exercise per Week: 0 days    Minutes of Exercise per Session: 0 min  Stress: Stress Concern Present (01/07/2023)   [employer] of Occupational Health - Occupational Stress Questionnaire    Feeling of Stress : Rather much  Social Connections: Moderately Integrated (01/07/2023)   Social Connection and Isolation Panel [NHANES]    Frequency of Communication with Friends and Family: More than three times a week    Frequency of Social Gatherings with Friends and Family: More than three times a week    Attends Religious Services: More than 4 times per year    Active Member of [employer] or Organizations: Yes    Attends Banker Meetings: More than 4 times per year    Marital Status: Widowed    Tobacco Counseling Counseling given: Not Answered Tobacco comments: husband and son smoked in her home.   Clinical Intake:  Pre-visit preparation completed: Yes  Pain : 0-10 Pain Score: 6  Pain Type: Chronic pain Pain Location: Knee Pain Orientation: Left Pain Descriptors / Indicators: Aching, Constant Pain Onset: More than a month ago Pain Relieving Factors: Tylenol  Pain Relieving Factors: Tylenol  BMI - recorded: 29.41 Nutritional Status: BMI 25 -29 Overweight Nutritional Risks: None Diabetes: No  How often do you need to have someone help you when you read instructions, pamphlets, or other written materials from your doctor or pharmacy?: 1 - Never  Interpreter Needed?: No  Information entered by :: R. Delroy Ordway LPN   Activities of Daily Living    01/07/2023   10:48 AM  In your present state of health, do you have any difficulty performing the following activities:   Hearing? 0  Vision? 0  Comment glasses  Difficulty concentrating or making decisions? 1  Walking or climbing stairs? 0  Dressing or bathing? 0  Doing errands, shopping? 1  Comment daughter helps  Preparing Food and eating ? N  Using the Toilet? N  In the past six months, have you accidently leaked urine? N  Do you have problems with loss of bowel control? N  Managing your Medications? N  Managing your Finances? N  Housekeeping or managing your Housekeeping? N    Patient Care Team: Dale [place], MD as PCP - General (Unknown Physician Specialty) Corky Crafts, MD as PCP - Cardiology (Cardiology) Scot Jun, MD (Inactive) (Gastroenterology)  Indicate any recent Medical Services you may have received from other than [facility] providers in the past year (date may be approximate).     Assessment:   This is a routine wellness examination for Kierrah.  Hearing/Vision screen Hearing Screening - Comments:: No issues Vision Screening - Comments:: glasses  Dietary issues and exercise activities discussed:     Goals Addressed             This Visit's Progress    Patient Stated       None       Depression Screen    01/07/2023   10:57 AM 10/05/2022    1:11 PM 09/27/2022    4:29 PM 08/02/2022    4:04 PM 04/11/2022   12:17 PM 03/09/2022    9:29 AM 03/02/2022    1:25 PM  PHQ 2/9 Scores  PHQ - 2 Score 1 0 2 2 2  0 1  PHQ- 9 Score 4 0 6 6 5       Fall Risk    01/07/2023   10:51 AM 10/05/2022    1:10 PM 09/27/2022    4:29 PM 07/23/2022    8:23 AM 05/23/2022   10:00 AM  Fall Risk   Falls in the past year? 0 0 0 0 0  Number falls in past yr: 0 0 0 0 0  Injury with Fall? 0 0 0 0 0  Risk for fall due to : No Fall Risks No Fall Risks No Fall Risks    Follow up Falls prevention discussed;Falls evaluation completed Falls evaluation completed  Falls evaluation completed Falls evaluation completed    MEDICARE RISK AT HOME:  Medicare Risk at Home - 01/07/23 1051      Any stairs in or around the home? Yes    If so, are there any without handrails? Yes    Home free of loose throw rugs in walkways, pet beds, electrical cords, etc? Yes    Adequate lighting in your home to reduce risk of falls? Yes    Life alert? No    Use of a cane, walker or w/c? No    Grab bars in the bathroom? Yes    Shower chair or bench in shower? Yes    Elevated toilet seat or a handicapped toilet? Yes              Cognitive Function:        01/07/2023   11:05 AM 11/22/2020   10:16 AM 11/20/2019    9:56 AM 11/19/2018    9:55 AM 05/29/2017    8:48 AM  6CIT Screen  What Year? 0 points 0 points 0 points 0 points 0 points  What month? 0 points 0 points 0 points 0 points 0 points  What time? 0 points 0 points 0 points 0 points 0 points  Count  back from 20 0 points 0 points 0 points 0 points 0 points  Months in reverse 0 points 0 points 0 points 0 points 0 points  Repeat phrase 6 points  2 points 0 points 0 points  Total Score 6 points  2 points 0 points 0 points    Immunizations Immunization History  Administered Date(s) Administered   Fluad Quad(high Dose 65+) 02/26/2019, 02/24/2020, 03/09/2022   Influenza, High Dose Seasonal PF 04/17/2017, 04/16/2018   Influenza,inj,Quad PF,6+ Mos 04/02/2013, 04/07/2014, 03/15/2015, 02/03/2016   Moderna Sars-Covid-2 Vaccination 12/03/2019, 01/02/2020   Pneumococcal Conjugate-13 01/11/2015   Pneumococcal Polysaccharide-23 01/14/2017   Tdap 01/08/2020    TDAP status: Up to date  Flu Vaccine status: Up to date  Pneumococcal vaccine status: Up to date  Covid-19 vaccine status: Information provided on how to obtain vaccines.   Qualifies for Shingles Vaccine? Yes   Zostavax completed No   Shingrix Completed?: No.    Education has been provided regarding the importance of this vaccine. Patient has been advised to call insurance company to determine out of pocket expense if they have not yet received this vaccine. Advised may also  receive vaccine at local pharmacy or Health Dept. Verbalized acceptance and understanding.  Screening Tests Health Maintenance  Topic Date Due   COVID-19 Vaccine (3 - Moderna risk series) 01/30/2020   Medicare Annual Wellness (AWV)  11/30/2022   Zoster Vaccines- Shingrix (1 of 2) 03/16/2023 (Originally 11/17/1969)   MAMMOGRAM  12/14/2023 (Originally 02/07/2019)   INFLUENZA VACCINE  01/10/2023   Colonoscopy  03/21/2026   DTaP/Tdap/Td (2 - Td or Tdap) 01/07/2030   Pneumonia Vaccine 37+ Years old  Completed   DEXA SCAN  Completed   Hepatitis C Screening  Completed   HPV VACCINES  Aged Out    Health Maintenance  Health Maintenance Due  Topic Date Due   COVID-19 Vaccine (3 - Moderna risk series) 01/30/2020   Medicare Annual Wellness (AWV)  11/30/2022    Colorectal cancer screening: Type of screening: Colonoscopy. Completed 10/22. Repeat every 2 years per patient  Mammogram status: Completed 8/19. Repeat every year Patient declines at this time  Bone Density status: Completed 7/17. Results reflect: Bone density results: OSTEOPOROSIS. Repeat every 2 years. Patient declines at this time  Lung Cancer Screening: (Low Dose CT Chest recommended if Age 23-80 years, 20 pack-year currently smoking OR have quit w/in 15years.) does not qualify.     Additional Screening:  Hepatitis C Screening: does qualify; Completed 2/18  Vision Screening: Recommended annual ophthalmology exams for early detection of glaucoma and other disorders of the eye. Is the patient up to date with their annual eye exam?  Yes  Who is the provider or what is the name of the office in which the patient attends annual eye exams? [facility] If pt is not established with a provider, would they like to be referred to a provider to establish care? No .   Dental Screening: Recommended annual dental exams for proper oral hygiene    Community Resource Referral / Chronic Care Management: CRR required this visit?  No    CCM required this visit?  No     Plan:     I have personally reviewed and noted the following in the patient's chart:   Medical and social history Use of alcohol, tobacco or illicit drugs  Current medications and supplements including opioid prescriptions. Patient is not currently taking opioid prescriptions. Functional ability and status Nutritional status Physical activity Advanced directives List of  other physicians Hospitalizations, surgeries, and ER visits in previous 12 months Vitals Screenings to include cognitive, depression, and falls Referrals and appointments  In addition, I have reviewed and discussed with patient certain preventive protocols, quality metrics, and best practice recommendations. A written personalized care plan for preventive services as well as general preventive health recommendations were provided to patient.     Sydell Axon, LPN   09/17/8117   After Visit Summary: (Pick Up) Due to this being a telephonic visit, with patients personalized plan was offered to patient and patient has requested to Pick up at office.  Nurse Notes: None

## 2023-01-08 DIAGNOSIS — M7051 Other bursitis of knee, right knee: Secondary | ICD-10-CM | POA: Diagnosis not present

## 2023-01-08 DIAGNOSIS — M1711 Unilateral primary osteoarthritis, right knee: Secondary | ICD-10-CM | POA: Diagnosis not present

## 2023-01-08 DIAGNOSIS — M25562 Pain in left knee: Secondary | ICD-10-CM | POA: Diagnosis not present

## 2023-01-11 ENCOUNTER — Ambulatory Visit (INDEPENDENT_AMBULATORY_CARE_PROVIDER_SITE_OTHER): Payer: 59 | Admitting: Internal Medicine

## 2023-01-11 ENCOUNTER — Encounter: Payer: Self-pay | Admitting: Internal Medicine

## 2023-01-11 VITALS — BP 126/74 | HR 61 | Temp 97.6°F | Ht 63.0 in | Wt 169.8 lb

## 2023-01-11 DIAGNOSIS — K219 Gastro-esophageal reflux disease without esophagitis: Secondary | ICD-10-CM

## 2023-01-11 DIAGNOSIS — I773 Arterial fibromuscular dysplasia: Secondary | ICD-10-CM | POA: Diagnosis not present

## 2023-01-11 DIAGNOSIS — I10 Essential (primary) hypertension: Secondary | ICD-10-CM

## 2023-01-11 DIAGNOSIS — G20A1 Parkinson's disease without dyskinesia, without mention of fluctuations: Secondary | ICD-10-CM

## 2023-01-11 DIAGNOSIS — R739 Hyperglycemia, unspecified: Secondary | ICD-10-CM | POA: Diagnosis not present

## 2023-01-11 DIAGNOSIS — I779 Disorder of arteries and arterioles, unspecified: Secondary | ICD-10-CM

## 2023-01-11 DIAGNOSIS — K760 Fatty (change of) liver, not elsewhere classified: Secondary | ICD-10-CM

## 2023-01-11 DIAGNOSIS — Z85038 Personal history of other malignant neoplasm of large intestine: Secondary | ICD-10-CM

## 2023-01-11 DIAGNOSIS — E7800 Pure hypercholesterolemia, unspecified: Secondary | ICD-10-CM

## 2023-01-11 DIAGNOSIS — M25562 Pain in left knee: Secondary | ICD-10-CM

## 2023-01-11 DIAGNOSIS — F339 Major depressive disorder, recurrent, unspecified: Secondary | ICD-10-CM

## 2023-01-11 DIAGNOSIS — M7989 Other specified soft tissue disorders: Secondary | ICD-10-CM

## 2023-01-11 DIAGNOSIS — I700 Atherosclerosis of aorta: Secondary | ICD-10-CM | POA: Diagnosis not present

## 2023-01-11 DIAGNOSIS — F419 Anxiety disorder, unspecified: Secondary | ICD-10-CM | POA: Diagnosis not present

## 2023-01-11 DIAGNOSIS — Z8589 Personal history of malignant neoplasm of other organs and systems: Secondary | ICD-10-CM

## 2023-01-11 NOTE — Progress Notes (Unsigned)
Subjective:    Patient ID: Victoria Hanson, female    DOB: December 01, 1950, 72 y.o.   MRN: [id number]  Patient here for  Chief Complaint  Patient presents with   Medical Management of Chronic Issues    HPI Here for follow up appt - f/u regarding her blood pressure and parkinsons. Seeing Dr Tat for f/u of parkinsons. On sinemet. Saw GI 12/20/22 - f/u colon cancer and anemia.  Planning for colonoscopy. Saw ortho 01/08/23 - f/u knee pain.  With regards to her left knee she seems to be having primarily pes anserine bursa tendinitis and bursitis. Right knee - moderate to severe degenerative arthritis. Requested gel injections. Hinged knee brace, topical voltaren gel and PT for left knee.    Past Medical History:  Diagnosis Date   Anxiety    Asthma    Atypical chest pain    a. 2003 Cath: reportedly nl per pt [name]);  b. 04/2015 [facility] Admission, neg troponin.   Colon cancer (HCC) 1999   a. 1999: Pt had partial colectomy. Did develop lung metastasis. Had right upper lobectomy in 01/2007 then had associated chemotherapy.   COPD (chronic obstructive pulmonary disease) (HCC)    DDD (degenerative disc disease), lumbosacral    Depression    Echocardiogram abnormal    a. 02/2010 Echo: EF 55-60%, mild LAE, no regional wall motion abnormalities   Elevated transaminase level    a. ? NASH;  b. 06/2015 - nl LFTs.   GERD (gastroesophageal reflux disease)    Hematuria    Holter monitor, abnormal    a. 12/2009 Holter monitoring: NSR, rare PACs and PVCs.   Hypercholesterolemia    Hyperlipidemia    Hypertension    Leaky heart valve    a. Per pt report - no evidence of valvular abnormalities on prior echoes.   Lesion of skin of Right breast 10/20/2017   Lung metastases 2008   a. S/P resection (lobectomy - right)   Morbid obesity (HCC)    Nephrolithiasis    pt denies   Parkinson disease 08/2016   [name]   Past Surgical History:  Procedure Laterality Date   BREAST EXCISIONAL BIOPSY Right 10/07/2009    benign   CARDIAC CATHETERIZATION  2003   negative as per pt report   CHOLECYSTECTOMY  2000   COLON SURGERY     Colon cancer, removed part of large colon   COLONOSCOPY N/A 04/25/2015   Procedure: COLONOSCOPY;  Surgeon: Scot Jun, MD;  Location: [facility] ENDOSCOPY;  Service: Endoscopy;  Laterality: N/A;   COLONOSCOPY WITH PROPOFOL N/A 06/14/2017   Procedure: COLONOSCOPY WITH PROPOFOL;  Surgeon: Scot Jun, MD;  Location: [facility] ENDOSCOPY;  Service: Endoscopy;  Laterality: N/A;   COLONOSCOPY WITH PROPOFOL N/A 11/17/2019   Procedure: COLONOSCOPY WITH PROPOFOL;  Surgeon: Wyline Mood, MD;  Location: [facility] ENDOSCOPY;  Service: Gastroenterology;  Laterality: N/A;   COLONOSCOPY WITH PROPOFOL N/A 03/21/2021   Procedure: COLONOSCOPY WITH PROPOFOL;  Surgeon: Regis Bill, MD;  Location: [facility] ENDOSCOPY;  Service: Endoscopy;  Laterality: N/A;   ESOPHAGOGASTRODUODENOSCOPY (EGD) WITH PROPOFOL N/A 03/21/2021   Procedure: ESOPHAGOGASTRODUODENOSCOPY (EGD) WITH PROPOFOL;  Surgeon: Regis Bill, MD;  Location: [facility] ENDOSCOPY;  Service: Endoscopy;  Laterality: N/A;   LOBECTOMY  01/2007   Right lung, upper lobe right side removed   REPLACEMENT TOTAL KNEE Left 08/2020   TOTAL ABDOMINAL HYSTERECTOMY     abnormal bleeding   TUBAL LIGATION  1980   Family History  Problem Relation Age of Onset  Coronary artery disease Mother        died @ 43 of MI.   Mental illness Mother    Diabetes Mother    Heart attack Father 72       MI   Cancer Father        lung - died in his mid-70's   Hypertension Father    Diabetes Father    Sudden death Brother    Mental illness Brother    Heart attack Brother        MI @ 59, died @ age 33.   Hypertension Child    Breast cancer Neg Hx    Social History   Socioeconomic History   Marital status: Widowed    Spouse name: Not on file   Number of children: Not on file   Years of education: Not on file   Highest education level: Not on file   Occupational History   Not on file  Tobacco Use   Smoking status: Never    Passive exposure: Yes   Smokeless tobacco: Never   Tobacco comments:    husband and son smoked in her home.  Vaping Use   Vaping status: Never Used  Substance and Sexual Activity   Alcohol use: Not Currently    Alcohol/week: 0.0 standard drinks of alcohol   Drug use: No   Sexual activity: Never  Other Topics Concern   Not on file  Social History Narrative   Widowed.   Disabled (colon/lung CA), retired.   Lives in [place] with her dtr, grand-dtr, and son.   Active.   Gets regular exercise, walks.   Right handed    Social Determinants of Health   Financial Resource Strain: Low Risk  (01/07/2023)   Overall Financial Resource Strain (CARDIA)    Difficulty of Paying Living Expenses: Not hard at all  Food Insecurity: No Food Insecurity (01/07/2023)   Hunger Vital Sign    Worried About Running Out of Food in the Last Year: Never true    Ran Out of Food in the Last Year: Never true  Transportation Needs: No Transportation Needs (01/07/2023)   PRAPARE - Administrator, Civil Service (Medical): No    Lack of Transportation (Non-Medical): No  Physical Activity: Inactive (01/07/2023)   Exercise Vital Sign    Days of Exercise per Week: 0 days    Minutes of Exercise per Session: 0 min  Stress: Stress Concern Present (01/07/2023)   [employer] of Occupational Health - Occupational Stress Questionnaire    Feeling of Stress : Rather much  Social Connections: Moderately Integrated (01/07/2023)   Social Connection and Isolation Panel [NHANES]    Frequency of Communication with Friends and Family: More than three times a week    Frequency of Social Gatherings with Friends and Family: More than three times a week    Attends Religious Services: More than 4 times per year    Active Member of [employer] or Organizations: Yes    Attends Banker Meetings: More than 4 times per year     Marital Status: Widowed     Review of Systems     Objective:     BP 126/74   Pulse 61   Temp 97.6 F (36.4 C) (Oral)   Ht 5\' 3"  (1.6 m)   Wt 169 lb 12.8 oz (77 kg)   SpO2 95%   BMI 30.08 kg/m  Wt Readings from Last 3 Encounters:  01/11/23 169 lb 12.8 oz (  77 kg)  01/07/23 166 lb (75.3 kg)  12/20/22 164 lb 12.8 oz (74.8 kg)    Physical Exam   Outpatient Encounter Medications as of 01/11/2023  Medication Sig   ALPRAZolam (XANAX) 0.5 MG tablet TAKE 1/2 TABLET BY MOUTH ONCE DAILY AS NEEDED   aspirin EC 81 MG tablet Take 81 mg by mouth as needed.    Carbidopa-Levodopa ER (SINEMET CR) 25-100 MG tablet controlled release 2 at 7am/1 at 11am/1 at 4pm and 2 at BED   cholecalciferol (VITAMIN D3) 25 MCG (1000 UNIT) tablet Take 1,000 Units by mouth daily.   citalopram (CELEXA) 10 MG tablet Take 1 tablet (10 mg total) by mouth daily.   diclofenac Sodium (VOLTAREN ARTHRITIS PAIN) 1 % GEL Apply topically 4 (four) times daily.   fluticasone-salmeterol (ADVAIR HFA) 115-21 MCG/ACT inhaler Inhale 2 puffs into the lungs 2 (two) times daily.   furosemide (LASIX) 20 MG tablet Take 1 tablet (20 mg total) by mouth daily.   montelukast (SINGULAIR) 10 MG tablet TAKE 1 TABLET BY MOUTH AT BEDTIME   PROAIR HFA 108 (90 Base) MCG/ACT inhaler INHALE 2 PUFFS BY MOUTH INTO THE LUNGS EVERY 6 HOURS AS NEEDED FOR WHEEZING   rosuvastatin (CRESTOR) 5 MG tablet TAKE 1 TABLET BY MOUTH EVERY MONDAY, WEDNESDAY AND FRIDAY   Spacer/Aero-Holding Chambers DEVI 1 Units by Does not apply route daily.   traZODone (DESYREL) 50 MG tablet TAKE 2 TABLETS BY MOUTH AT BEDTIME   vitamin B-12 (CYANOCOBALAMIN) 100 MCG tablet Take 100 mcg by mouth daily.   vitamin E 180 MG (400 UNITS) capsule Take 400 Units by mouth daily.   No facility-administered encounter medications on file as of 01/11/2023.     Lab Results  Component Value Date   WBC 8.8 12/14/2022   HGB 11.8 (L) 12/14/2022   HCT 37.1 12/14/2022   PLT 241.0 12/14/2022    GLUCOSE 92 12/14/2022   CHOL 199 10/25/2022   TRIG 83.0 10/25/2022   HDL 61.50 10/25/2022   LDLDIRECT 163.4 12/31/2012   LDLCALC 121 (H) 10/25/2022   ALT 3 12/14/2022   AST 11 12/14/2022   NA 140 12/14/2022   K 4.1 12/14/2022   CL 103 12/14/2022   CREATININE 0.92 12/14/2022   BUN 19 12/14/2022   CO2 28 12/14/2022   TSH 1.22 12/14/2022   INR 1.0 12/12/2012   HGBA1C 6.0 10/25/2022    NM Bone Scan 3 Phase  Result Date: 09/21/2022 CLINICAL DATA:  LEFT knee arthroplasty EXAM: NUCLEAR MEDICINE 3-PHASE BONE SCAN TECHNIQUE: Radionuclide angiographic images, immediate static blood pool images, and 3-hour delayed static images were obtained of the knees after intravenous injection of radiopharmaceutical. RADIOPHARMACEUTICALS:  21.0 mCi Tc-28m MDP IV COMPARISON:  None Available. FINDINGS: Vascular phase: No asymmetric or increased blood flow to the LEFT or RIGHT knee. Blood pool phase: Mild asymmetric blood pool activity surrounding the LEFT knee joint. Delayed phase: Focal radiotracer activity accumulates within the LEFT patella. No abnormal radiotracer accumulation within the LEFT tibial plateau or femoral condyles. Photopenia noted associated with the LEFT knee prosthetic. Mild degenerative uptake in the medial LEFT knee compartment. IMPRESSION: 1. No evidence of loosening infection of the LEFT knee prosthetic. 2. Focal delayed uptake in the LEFT patella. Electronically Signed   By: Genevive Bi M.D.   On: 09/21/2022 10:57       Assessment & Plan:  There are no diagnoses linked to this encounter.   Dale Goliad, MD

## 2023-01-13 ENCOUNTER — Encounter: Payer: Self-pay | Admitting: Internal Medicine

## 2023-01-13 NOTE — Assessment & Plan Note (Signed)
Continue celexa.  Using 1/2 xanax q day prn.  Discussed limiting use.  Follow.

## 2023-01-13 NOTE — Assessment & Plan Note (Signed)
Previously evaluated by [facility].  Needs to continue crestor.   

## 2023-01-13 NOTE — Assessment & Plan Note (Signed)
S/p lobectomy.  Followed by pulmonary.  Albuterol inhaler if needed.

## 2023-01-13 NOTE — Assessment & Plan Note (Signed)
Seeing Dr Tat.  On sinemet - taking as outlined.  Has been doing better overall.  Follow.

## 2023-01-13 NOTE — Assessment & Plan Note (Signed)
Has been evaluated by [facility].  Continue risk factor modificaiton.  

## 2023-01-13 NOTE — Assessment & Plan Note (Signed)
Saw GI 12/20/22 - f/u colon cancer and anemia.  Planning for colonoscopy.

## 2023-01-13 NOTE — Assessment & Plan Note (Signed)
Saw ortho 01/08/23 - f/u knee pain.  With regards to her left knee she seems to be having primarily pes anserine bursa tendinitis and bursitis. Right knee - moderate to severe degenerative arthritis. Requested gel injections. Hinged knee brace, topical voltaren gel and PT for left knee.

## 2023-01-13 NOTE — Assessment & Plan Note (Signed)
Diet and exercise.  Follow liver panel. Seen on chest CT angio - 09/11/21 

## 2023-01-13 NOTE — Assessment & Plan Note (Signed)
Varying pressures.  Hold on additional medication.  Off amlodipine. Lasix.   Follow metabolic panel.  Follow pressures.   

## 2023-01-13 NOTE — Assessment & Plan Note (Signed)
Low carb diet and exercise.  Follow met b and a1c.   

## 2023-01-13 NOTE — Assessment & Plan Note (Signed)
Continue citalopram.   Follow. Doing better.  

## 2023-01-13 NOTE — Assessment & Plan Note (Signed)
Crestor. Low cholesterol diet and exercise.  Follow lipid panel and liver function tests.  

## 2023-01-13 NOTE — Assessment & Plan Note (Signed)
Discussed leg elevation and compression hose.  Discussed monitoring sodium intake.  Has tried compression hose.  Persistent problem.  Agreeable to referral to [facility].

## 2023-01-13 NOTE — Assessment & Plan Note (Signed)
Upper symptoms appear to be controlled on prilosec.  Follow.  

## 2023-01-13 NOTE — Assessment & Plan Note (Signed)
-   Crestor 

## 2023-01-16 ENCOUNTER — Other Ambulatory Visit: Payer: Self-pay | Admitting: Internal Medicine

## 2023-01-17 NOTE — Telephone Encounter (Signed)
LOV 01/11/2023 NOV 02/22/2023

## 2023-01-17 NOTE — Telephone Encounter (Signed)
Rx ok'd for xanax and singulair.

## 2023-01-22 DIAGNOSIS — M1711 Unilateral primary osteoarthritis, right knee: Secondary | ICD-10-CM | POA: Diagnosis not present

## 2023-01-22 NOTE — Progress Notes (Signed)
Assessment/Plan:    1.  Parkinson's disease,              -The biggest issue previously has been her intolerance to medication.  She is doing markedly better with extended release levodopa, but she has backed down the dose some to 1.5 tablets 4 times per day (although it is the same overall dose).  She really was doing this because she was finding it was dropping her blood pressure, but forgot that she had midodrine at home.  Nonetheless, I really do not have any objection if she takes carbidopa/levodopa 25/100 CR, 1.5 tablets at 7 AM/11 AM/4 PM/bedtime.  This extended release can be split.  It is currently prescribed as carbidopa/levodopa 25/100 CR, 2 tablets at 7 AM, 1 tablet at 11 AM, 1 tablet at 4 PM and 2 tablets at bedtime .  She asked me to keep the prescription written that way in case she goes back.    2.  Sialorrhea             -This is commonly associated with PD.  We talked about treatments.  The patient is not a candidate for oral anticholinergic therapy because of increased risk of confusion and falls.  We discussed Botox (type A and B) and 1% atropine drops.  We discusssed that candy like lemon drops can help by stimulating mm of the oropharynx to induce swallowing.   3.  Constipation             -discussed nature and pathophysiology and association with PD             -discussed importance of hydration.  Pt is to increase water intake             -pt is given a copy of the rancho recipe             -recommended daily colace             -recommended miralax prn   4.  Hx of Neurogenic Orthostatic Hypotension             -has midodrine at home but hasn't taken.  Discussed with her that I do not want her to hold her levodopa for low blood pressure, but rather take the midodrine.  Told her she should hold the midodrine for systolic blood pressure greater than 150.             -she is back on amlodipine, but only takes it as needed.  She did not take it today.  -Will need to  continue to monitor her blood pressure.  -Sounds like home stress is contributing to an increase in blood pressure lately.   5. SOB  -I don't think related to Parkinsons Disease but would like her to f/u with pulmonology.    -May be anxiety related as she is using Xanax now for it and finds it helpful.  I did tell her, however, that I do not like Xanax in this age group, nor do I like it in my Parkinson's patients because it throws off balance.  She expressed appreciation for the knowledge.  Subjective:   Victoria Hanson was seen today in follow up for Parkinsons disease.  My previous records were reviewed prior to todays visit as well as outside records available to me. Sister with patient and supplements hx. when I saw the patient last visit, we decided to slowly increase her levodopa and add some bedtime dosing.  She  reports today that she has decreased her dosage back to 1.5 tablets tablets each time rather than 2/1/1/2.  She states that if she takes 2 her BP will sometimes drop low.  She isn't taking the midodrine.  She forgot she had it.  She is taking half tablet to 1 tablet of Xanax when her breathing gets bad and states that it seems to help.  There have been some home situations that have been stressful.  Current movement disorder medications: Carbidopa/levodopa 25/100 CR 2 tablets at 7 AM, 1 tablet at 11 AM, 1 tablet at 4 PM and 2 tablets at bedtime   PREVIOUS MEDICATIONS:  ropinirole (took 1 day and said made sleepy, but levodopa was also started the same day; retried several years later at 0.25 mg and they called a week later and stated caused low BP but already had dx of noh); pramipexole (unclear if took it; 1 note stated that she did not take it and another note stated that she had hallucinations with it - pt does recall hallucinations with "one medication"); midodrine (given but didn't take it) ; rotigotine, 2 mg daily  ALLERGIES:   Allergies  Allergen Reactions   Macrobid  [Nitrofurantoin Macrocrystal] Shortness Of Breath and Palpitations   Antihistamines, Diphenhydramine-Type Other (See Comments)    She does not know which antihistamine she has an allergic reaction to.   Aspirin Other (See Comments)    Makes her sick on the stomach if she takes too many and causes bleeding.  Of note, she can take ibuprofen. Other reaction(s): Other (See Comments) Makes her sick on the stomach.  Of note, she can take ibuprofen. Makes her sick on the stomach.  Of note, she can take ibuprofen.   Ceftin [Cefuroxime Axetil] Other (See Comments)    Chest pains   Celecoxib Other (See Comments)    Reaction: Blood Disorder GI bleeding.   Cymbalta [Duloxetine Hcl] Other (See Comments)    Reaction: unknown   Diphenhydramine Other (See Comments)   Escitalopram Other (See Comments)   Lexapro [Escitalopram Oxalate] Other (See Comments)    Reaction: Difficulty with speech   Metronidazole Other (See Comments)    Other reaction(s): Unknown   Nitrofurantoin Other (See Comments)   Zoloft [Sertraline Hcl]    Ciprofloxacin Other (See Comments)    Reaction: unknown    CURRENT MEDICATIONS:  Current Meds  Medication Sig   ALPRAZolam (XANAX) 0.5 MG tablet TAKE 1/2 TABLET BY MOUTH ONCE DAILY AS NEEDED   aspirin EC 81 MG tablet Take 81 mg by mouth as needed.    Carbidopa-Levodopa ER (SINEMET CR) 25-100 MG tablet controlled release 2 at 7am/1 at 11am/1 at 4pm and 2 at BED   cholecalciferol (VITAMIN D3) 25 MCG (1000 UNIT) tablet Take 1,000 Units by mouth daily.   citalopram (CELEXA) 10 MG tablet Take 1 tablet (10 mg total) by mouth daily.   diclofenac Sodium (VOLTAREN ARTHRITIS PAIN) 1 % GEL Apply topically 4 (four) times daily.   fluticasone-salmeterol (ADVAIR HFA) 115-21 MCG/ACT inhaler Inhale 2 puffs into the lungs 2 (two) times daily.   furosemide (LASIX) 20 MG tablet Take 1 tablet (20 mg total) by mouth daily.   montelukast (SINGULAIR) 10 MG tablet TAKE 1 TABLET BY MOUTH AT BEDTIME    PROAIR HFA 108 (90 Base) MCG/ACT inhaler INHALE 2 PUFFS BY MOUTH INTO THE LUNGS EVERY 6 HOURS AS NEEDED FOR WHEEZING   rosuvastatin (CRESTOR) 5 MG tablet TAKE 1 TABLET BY MOUTH EVERY MONDAY, WEDNESDAY AND FRIDAY  Spacer/Aero-Holding Chambers DEVI 1 Units by Does not apply route daily.   traZODone (DESYREL) 50 MG tablet TAKE 2 TABLETS BY MOUTH AT BEDTIME   vitamin B-12 (CYANOCOBALAMIN) 100 MCG tablet Take 100 mcg by mouth daily.   vitamin E 180 MG (400 UNITS) capsule Take 400 Units by mouth daily.     Objective:   PHYSICAL EXAMINATION:    VITALS:   Vitals:   01/31/23 1104  BP: (!) 144/88  Pulse: 70  SpO2: 98%  Weight: 167 lb 3.2 oz (75.8 kg)  Height: 5\' 3"  (1.6 m)     GEN:  The patient appears stated age and is in NAD. HEENT:  Normocephalic, atraumatic.  The mucous membranes are moist. The superficial temporal arteries are without ropiness or tenderness. Cardiovascular: Regular rate rhythm Lungs: Clear to auscultation bilaterally  Neurological examination:  Orientation: The patient is alert and oriented x3. Cranial nerves: There is good facial symmetry with mild facial hypomimia. The speech is fluent and clear. Soft palate rises symmetrically and there is no tongue deviation. Hearing is intact to conversational tone. Sensation: Sensation is intact to light touch throughout Motor: Strength is at least antigravity x4.  Movement examination: Tone: There is normal tone in the upper and lower extremities today. Abnormal movements: none even with distraction procedures. Coordination:  There is no decremation today. Gait and Station: The patient pushes off to arise.  She has no start hesitation today.  She is short stepped but not shuffling.  She is a bit wide-based.    I have reviewed and interpreted the following labs independently    Chemistry      Component Value Date/Time   NA 140 12/14/2022 1231   NA 140 02/17/2021 1029   NA 134 (L) 09/01/2014 1359   K 4.1 12/14/2022  1231   K 4.1 09/01/2014 1359   CL 103 12/14/2022 1231   CL 103 09/01/2014 1359   CO2 28 12/14/2022 1231   CO2 27 09/01/2014 1359   BUN 19 12/14/2022 1231   BUN 18 02/17/2021 1029   BUN 23 (H) 09/01/2014 1359   CREATININE 0.92 12/14/2022 1231   CREATININE 1.16 (H) 01/14/2017 1217      Component Value Date/Time   CALCIUM 9.5 12/14/2022 1231   CALCIUM 8.8 (L) 09/01/2014 1359   ALKPHOS 71 12/14/2022 1231   ALKPHOS 85 09/01/2014 1359   AST 11 12/14/2022 1231   AST 16 09/01/2014 1359   ALT 3 12/14/2022 1231   ALT 9 (L) 09/01/2014 1359   BILITOT 0.4 12/14/2022 1231   BILITOT 0.4 09/01/2014 1359       Lab Results  Component Value Date   WBC 8.8 12/14/2022   HGB 11.8 (L) 12/14/2022   HCT 37.1 12/14/2022   MCV 89.7 12/14/2022   PLT 241.0 12/14/2022    Lab Results  Component Value Date   TSH 1.22 12/14/2022     Total time spent on today's visit was 37 minutes, including both face-to-face time and nonface-to-face time.  Time included that spent on review of records (prior notes available to me/labs/imaging if pertinent), discussing treatment and goals, answering patient's questions and coordinating care.  Cc:  Dale [place], MD

## 2023-01-23 ENCOUNTER — Telehealth: Payer: Self-pay | Admitting: Gastroenterology

## 2023-01-23 ENCOUNTER — Telehealth: Payer: Self-pay

## 2023-01-23 ENCOUNTER — Other Ambulatory Visit: Payer: Self-pay

## 2023-01-23 NOTE — Telephone Encounter (Signed)
Patient called in to schedule her colonoscopy. She is a patient of Dr. Tobi Bastos but was schedule with Dr. Allegra Lai. Patient asked for the nurse to call her home phone but if she don't answer call her cell

## 2023-01-23 NOTE — Telephone Encounter (Signed)
Patient called back to reschedule her procedure

## 2023-01-23 NOTE — Telephone Encounter (Signed)
Patient is currently scheduled for Colonoscopy 02/04/23 With Dr.Vanga  Patient called in to schedule her colonoscopy. She is a patient of Dr. Tobi Bastos but was schedule with Dr. Allegra Lai. Patient asked for the nurse to call her home phone but if she don't answer call her cell

## 2023-01-23 NOTE — Telephone Encounter (Signed)
Spoke with patient-she is currently scheduled Colonoscopy w/Dr.Vanga 02-04-23 but would like to reschedule to 03-21-23. I will call [facility] to reschedule also. Mailed instructions to patient today for bowel prep.

## 2023-01-28 ENCOUNTER — Ambulatory Visit: Payer: 59 | Admitting: Neurology

## 2023-01-29 DIAGNOSIS — M1711 Unilateral primary osteoarthritis, right knee: Secondary | ICD-10-CM | POA: Diagnosis not present

## 2023-01-31 ENCOUNTER — Encounter: Payer: Self-pay | Admitting: Neurology

## 2023-01-31 ENCOUNTER — Ambulatory Visit (INDEPENDENT_AMBULATORY_CARE_PROVIDER_SITE_OTHER): Payer: 59 | Admitting: Neurology

## 2023-01-31 VITALS — BP 144/88 | HR 70 | Ht 63.0 in | Wt 167.2 lb

## 2023-01-31 DIAGNOSIS — G20A2 Parkinson's disease without dyskinesia, with fluctuations: Secondary | ICD-10-CM | POA: Diagnosis not present

## 2023-01-31 DIAGNOSIS — G903 Multi-system degeneration of the autonomic nervous system: Secondary | ICD-10-CM

## 2023-01-31 MED ORDER — CARBIDOPA-LEVODOPA ER 25-100 MG PO TBCR: EXTENDED_RELEASE_TABLET | ORAL | 1 refills | Status: DC

## 2023-01-31 NOTE — Patient Instructions (Signed)
 SAVE THE DATE!  We are planning a Parkinsons Disease educational symposium at [facility] in [place] on October 11.  More details to come!  If you would like to be added to our email list to get further information, email [email or web address].  We hope to see you there!

## 2023-02-05 DIAGNOSIS — M1711 Unilateral primary osteoarthritis, right knee: Secondary | ICD-10-CM | POA: Diagnosis not present

## 2023-02-12 ENCOUNTER — Ambulatory Visit: Payer: 59 | Admitting: Gastroenterology

## 2023-02-12 ENCOUNTER — Ambulatory Visit (INDEPENDENT_AMBULATORY_CARE_PROVIDER_SITE_OTHER): Payer: 59 | Admitting: Vascular Surgery

## 2023-02-12 VITALS — BP 173/82 | HR 67 | Resp 18 | Ht 63.0 in | Wt 171.2 lb

## 2023-02-12 DIAGNOSIS — E782 Mixed hyperlipidemia: Secondary | ICD-10-CM

## 2023-02-12 DIAGNOSIS — M7989 Other specified soft tissue disorders: Secondary | ICD-10-CM

## 2023-02-12 DIAGNOSIS — I779 Disorder of arteries and arterioles, unspecified: Secondary | ICD-10-CM | POA: Diagnosis not present

## 2023-02-12 DIAGNOSIS — I10 Essential (primary) hypertension: Secondary | ICD-10-CM

## 2023-02-12 NOTE — Assessment & Plan Note (Signed)
lipid control important in reducing the progression of atherosclerotic disease. Continue statin therapy  

## 2023-02-12 NOTE — Progress Notes (Signed)
Patient ID: Victoria Hanson, female   DOB: January 19, 1951, 72 y.o.   MRN: [id number]  Chief Complaint  Patient presents with   Establish Care    HPI Victoria Hanson is a 72 y.o. female.  I am asked to see the patient by Dr. Lorin Picket for evaluation of leg swelling.  The patient reports she began having leg swelling after her knee surgery on the left leg about a year and a half ago.  This is steadily progressed over time.  She is now having swelling on the right leg.  She had a knee replacement in March 2023.  No open wounds or infection.  No fevers or chills.  The left leg is painful.  She says the right leg swells but is not painful.  No previous history of DVT or superficial thrombophlebitis to her knowledge, and she has been checked on multiple occasions previously.  She has tried compression socks but has a hard time getting these on and off and they have not provided much relief.   Also, the patient has a previous history of very mild carotid disease.  This appears to have been last checked in 2018.  No focal neurologic symptoms since that time.  She is treated for Parkinson's disease.  Past Medical History:  Diagnosis Date   Anxiety    Asthma    Atypical chest pain    a. 2003 Cath: reportedly nl per pt [name]);  b. 04/2015 [facility] Admission, neg troponin.   Colon cancer (HCC) 1999   a. 1999: Pt had partial colectomy. Did develop lung metastasis. Had right upper lobectomy in 01/2007 then had associated chemotherapy.   COPD (chronic obstructive pulmonary disease) (HCC)    DDD (degenerative disc disease), lumbosacral    Depression    Echocardiogram abnormal    a. 02/2010 Echo: EF 55-60%, mild LAE, no regional wall motion abnormalities   Elevated transaminase level    a. ? NASH;  b. 06/2015 - nl LFTs.   GERD (gastroesophageal reflux disease)    Hematuria    Holter monitor, abnormal    a. 12/2009 Holter monitoring: NSR, rare PACs and PVCs.   Hypercholesterolemia    Hyperlipidemia     Hypertension    Leaky heart valve    a. Per pt report - no evidence of valvular abnormalities on prior echoes.   Lesion of skin of Right breast 10/20/2017   Lung metastases 2008   a. S/P resection (lobectomy - right)   Morbid obesity (HCC)    Nephrolithiasis    pt denies   Parkinson disease 08/2016   [name]    Past Surgical History:  Procedure Laterality Date   BREAST EXCISIONAL BIOPSY Right 10/07/2009   benign   CARDIAC CATHETERIZATION  2003   negative as per pt report   CHOLECYSTECTOMY  2000   COLON SURGERY     Colon cancer, removed part of large colon   COLONOSCOPY N/A 04/25/2015   Procedure: COLONOSCOPY;  Surgeon: Scot Jun, MD;  Location: [facility] ENDOSCOPY;  Service: Endoscopy;  Laterality: N/A;   COLONOSCOPY WITH PROPOFOL N/A 06/14/2017   Procedure: COLONOSCOPY WITH PROPOFOL;  Surgeon: Scot Jun, MD;  Location: [facility] ENDOSCOPY;  Service: Endoscopy;  Laterality: N/A;   COLONOSCOPY WITH PROPOFOL N/A 11/17/2019   Procedure: COLONOSCOPY WITH PROPOFOL;  Surgeon: Wyline Mood, MD;  Location: [facility] ENDOSCOPY;  Service: Gastroenterology;  Laterality: N/A;   COLONOSCOPY WITH PROPOFOL N/A 03/21/2021   Procedure: COLONOSCOPY WITH PROPOFOL;  Surgeon: Regis Bill,  MD;  Location: [facility] ENDOSCOPY;  Service: Endoscopy;  Laterality: N/A;   ESOPHAGOGASTRODUODENOSCOPY (EGD) WITH PROPOFOL N/A 03/21/2021   Procedure: ESOPHAGOGASTRODUODENOSCOPY (EGD) WITH PROPOFOL;  Surgeon: Regis Bill, MD;  Location: [facility] ENDOSCOPY;  Service: Endoscopy;  Laterality: N/A;   LOBECTOMY  01/2007   Right lung, upper lobe right side removed   REPLACEMENT TOTAL KNEE Left 08/2020   TOTAL ABDOMINAL HYSTERECTOMY     abnormal bleeding   TUBAL LIGATION  1980     Family History  Problem Relation Age of Onset   Coronary artery disease Mother        died @ 20 of MI.   Mental illness Mother    Diabetes Mother    Heart attack Father 73       MI   Cancer Father        lung - died in his  mid-70's   Hypertension Father    Diabetes Father    Sudden death Brother    Mental illness Brother    Heart attack Brother        MI @ 49, died @ age 62.   Hypertension Child    Breast cancer Neg Hx      Social History   Tobacco Use   Smoking status: Never    Passive exposure: Yes   Smokeless tobacco: Never   Tobacco comments:    husband and son smoked in her home.  Vaping Use   Vaping status: Never Used  Substance Use Topics   Alcohol use: Not Currently    Alcohol/week: 0.0 standard drinks of alcohol   Drug use: No     Allergies  Allergen Reactions   Macrobid [Nitrofurantoin Macrocrystal] Shortness Of Breath and Palpitations   Antihistamines, Diphenhydramine-Type Other (See Comments)    She does not know which antihistamine she has an allergic reaction to.   Aspirin Other (See Comments)    Makes her sick on the stomach if she takes too many and causes bleeding.  Of note, she can take ibuprofen. Other reaction(s): Other (See Comments) Makes her sick on the stomach.  Of note, she can take ibuprofen. Makes her sick on the stomach.  Of note, she can take ibuprofen.   Ceftin [Cefuroxime Axetil] Other (See Comments)    Chest pains   Celecoxib Other (See Comments)    Reaction: Blood Disorder GI bleeding.   Cymbalta [Duloxetine Hcl] Other (See Comments)    Reaction: unknown   Diphenhydramine Other (See Comments)   Escitalopram Other (See Comments)   Lexapro [Escitalopram Oxalate] Other (See Comments)    Reaction: Difficulty with speech   Metronidazole Other (See Comments)    Other reaction(s): Unknown   Nitrofurantoin Other (See Comments)   Zoloft [Sertraline Hcl]    Ciprofloxacin Other (See Comments)    Reaction: unknown    Current Outpatient Medications  Medication Sig Dispense Refill   ALPRAZolam (XANAX) 0.5 MG tablet TAKE 1/2 TABLET BY MOUTH ONCE DAILY AS NEEDED 30 tablet 0   amLODipine (NORVASC) 5 MG tablet Take 5 mg by mouth daily.     aspirin EC 81 MG  tablet Take 81 mg by mouth as needed.      Carbidopa-Levodopa ER (SINEMET CR) 25-100 MG tablet controlled release 2 at 7am/1 at 11am/1 at 4pm and 2 at BED 540 tablet 1   cholecalciferol (VITAMIN D3) 25 MCG (1000 UNIT) tablet Take 1,000 Units by mouth daily.     citalopram (CELEXA) 10 MG tablet Take 1 tablet (10 mg total)  by mouth daily. 90 tablet 0   diclofenac Sodium (VOLTAREN ARTHRITIS PAIN) 1 % GEL Apply topically 4 (four) times daily.     fluticasone-salmeterol (ADVAIR HFA) 115-21 MCG/ACT inhaler Inhale 2 puffs into the lungs 2 (two) times daily. 1 Inhaler 4   furosemide (LASIX) 20 MG tablet Take 1 tablet (20 mg total) by mouth daily. 30 tablet 11   montelukast (SINGULAIR) 10 MG tablet TAKE 1 TABLET BY MOUTH AT BEDTIME 30 tablet 2   PROAIR HFA 108 (90 Base) MCG/ACT inhaler INHALE 2 PUFFS BY MOUTH INTO THE LUNGS EVERY 6 HOURS AS NEEDED FOR WHEEZING 8.5 g 2   rosuvastatin (CRESTOR) 5 MG tablet TAKE 1 TABLET BY MOUTH EVERY MONDAY, WEDNESDAY AND FRIDAY 39 tablet 1   Spacer/Aero-Holding Chambers DEVI 1 Units by Does not apply route daily. 1 Units 0   traZODone (DESYREL) 50 MG tablet TAKE 2 TABLETS BY MOUTH AT BEDTIME 180 tablet 1   vitamin B-12 (CYANOCOBALAMIN) 100 MCG tablet Take 100 mcg by mouth daily.     vitamin E 180 MG (400 UNITS) capsule Take 400 Units by mouth daily.     No current facility-administered medications for this visit.      REVIEW OF SYSTEMS (Negative unless checked)  Constitutional: [] Weight loss  [] Fever  [] Chills Cardiac: [] Chest pain   [] Chest pressure   [] Palpitations   [] Shortness of breath when laying flat   [] Shortness of breath at rest   [] Shortness of breath with exertion. Vascular:  [] Pain in legs with walking   [] Pain in legs at rest   [] Pain in legs when laying flat   [] Claudication   [] Pain in feet when walking  [] Pain in feet at rest  [] Pain in feet when laying flat   [] History of DVT   [] Phlebitis   [x] Swelling in legs   [] Varicose veins   [] Non-healing  ulcers Pulmonary:   [] Uses home oxygen   [] Productive cough   [] Hemoptysis   [] Wheeze  [] COPD   [] Asthma Neurologic:  [] Dizziness  [] Blackouts   [] Seizures   [] History of stroke   [] History of TIA  [] Aphasia   [] Temporary blindness   [] Dysphagia   [] Weakness or numbness in arms   [] Weakness or numbness in legs Musculoskeletal:  [x] Arthritis   [] Joint swelling   [x] Joint pain   [] Low back pain Hematologic:  [] Easy bruising  [] Easy bleeding   [] Hypercoagulable state   [] Anemic  [] Hepatitis Gastrointestinal:  [] Blood in stool   [] Vomiting blood  [] Gastroesophageal reflux/heartburn   [] Abdominal pain Genitourinary:  [] Chronic kidney disease   [] Difficult urination  [] Frequent urination  [] Burning with urination   [] Hematuria Skin:  [] Rashes   [] Ulcers   [] Wounds Psychological:  [] History of anxiety   []  History of major depression.    Physical Exam BP (!) 173/82 (BP Location: Left Arm)   Pulse 67   Resp 18   Ht 5\' 3"  (1.6 m)   Wt 171 lb 3.2 oz (77.7 kg)   BMI 30.33 kg/m  Gen:  WD/WN, NAD Head: [place]/AT, No temporalis wasting. Ear/Nose/Throat: Hearing grossly intact, nares w/o erythema or drainage, oropharynx w/o Erythema/Exudate Eyes: Conjunctiva clear, sclera non-icteric  Neck: trachea midline.  No JVD.  Pulmonary:  Good air movement, respirations not labored, no use of accessory muscles  Cardiac: RRR, no JVD Vascular:  Vessel Right Left  Radial Palpable Palpable                          DP  2+ 1+  PT 1+ NP   Gastrointestinal:. No masses, surgical incisions, or scars. Musculoskeletal: M/S 5/5 throughout.  Extremities without ischemic changes.  No deformity or atrophy.  1+ right lower extremity edema, 2+ left lower extremity edema.  Diffuse superficial varicosities are present bilaterally Neurologic: Sensation grossly intact in extremities.  Symmetrical.  Speech is fluent. Motor exam as listed above. Psychiatric: Judgment intact, Mood & affect appropriate for pt's clinical  situation. Dermatologic: No rashes or ulcers noted.  No cellulitis or open wounds.    Radiology No results found.  Labs Recent Results (from the past 2160 hour(s))  Urinalysis, Routine w reflex microscopic     Status: Abnormal   Collection Time: 12/14/22 11:53 AM  Result Value Ref Range   Color, Urine YELLOW Yellow;Lt. Yellow;Straw;Dark Yellow;Amber;Green;Red;Brown   APPearance CLEAR Clear;Turbid;Slightly Cloudy;Cloudy   Specific Gravity, Urine 1.010 1.000 - 1.030   pH 5.5 5.0 - 8.0   Total Protein, Urine NEGATIVE Negative   Urine Glucose NEGATIVE Negative   Ketones, ur NEGATIVE Negative   Bilirubin Urine NEGATIVE Negative   Hgb urine dipstick TRACE-INTACT (A) Negative   Urobilinogen, UA 0.2 0.0 - 1.0   Leukocytes,Ua MODERATE (A) Negative   Nitrite NEGATIVE Negative   WBC, UA 11-20/hpf (A) 0-2/hpf   RBC / HPF 0-2/hpf 0-2/hpf   Bacteria, UA Many(>50/hpf) (A) None  Urine Culture     Status: Abnormal   Collection Time: 12/14/22 11:53 AM   Specimen: Urine  Result Value Ref Range   MICRO NUMBER: [id number]    SPECIMEN QUALITY: Adequate    Sample Source URINE    STATUS: FINAL    ISOLATE 1: Escherichia coli (A)     Comment: Greater than 100,000 CFU/mL of Escherichia coli      Susceptibility   Escherichia coli - URINE CULTURE, REFLEX    AMOX/CLAVULANIC 8 Sensitive     AMPICILLIN >=32 Resistant     AMPICILLIN/SULBACTAM 16 Intermediate     CEFAZOLIN* <=4 Not Reportable      * For infections other than uncomplicated UTI caused by E. coli, K. pneumoniae or P. mirabilis: Cefazolin is resistant if MIC > or = 8 mcg/mL. (Distinguishing susceptible versus intermediate for isolates with MIC < or = 4 mcg/mL requires additional testing.) For uncomplicated UTI caused by E. coli, K. pneumoniae or P. mirabilis: Cefazolin is susceptible if MIC <32 mcg/mL and predicts susceptible to the oral agents cefaclor, cefdinir, cefpodoxime, cefprozil, cefuroxime, cephalexin and loracarbef.      CEFTAZIDIME <=1 Sensitive     CEFEPIME <=1 Sensitive     CEFTRIAXONE <=1 Sensitive     CIPROFLOXACIN <=0.25 Sensitive     LEVOFLOXACIN <=0.12 Sensitive     GENTAMICIN <=1 Sensitive     IMIPENEM <=0.25 Sensitive     NITROFURANTOIN <=16 Sensitive     PIP/TAZO <=4 Sensitive     TOBRAMYCIN <=1 Sensitive     TRIMETH/SULFA* <=20 Sensitive      * For infections other than uncomplicated UTI caused by E. coli, K. pneumoniae or P. mirabilis: Cefazolin is resistant if MIC > or = 8 mcg/mL. (Distinguishing susceptible versus intermediate for isolates with MIC < or = 4 mcg/mL requires additional testing.) For uncomplicated UTI caused by E. coli, K. pneumoniae or P. mirabilis: Cefazolin is susceptible if MIC <32 mcg/mL and predicts susceptible to the oral agents cefaclor, cefdinir, cefpodoxime, cefprozil, cefuroxime, cephalexin and loracarbef. Legend: S = Susceptible  I = Intermediate R = Resistant  NS = Not susceptible * = Not tested  NR = Not reported **NN = See antimicrobic comments   Basic metabolic panel     Status: None   Collection Time: 12/14/22 12:31 PM  Result Value Ref Range   Sodium 140 135 - 145 mEq/L   Potassium 4.1 3.5 - 5.1 mEq/L   Chloride 103 96 - 112 mEq/L   CO2 28 19 - 32 mEq/L   Glucose, Bld 92 70 - 99 mg/dL   BUN 19 6 - 23 mg/dL   Creatinine, Ser 8.65 0.40 - 1.20 mg/dL   GFR 78.46 >96.29 mL/min    Comment: Calculated using the CKD-EPI Creatinine Equation (2021)   Calcium 9.5 8.4 - 10.5 mg/dL  Hepatic function panel     Status: None   Collection Time: 12/14/22 12:31 PM  Result Value Ref Range   Total Bilirubin 0.4 0.2 - 1.2 mg/dL   Bilirubin, Direct 0.1 0.0 - 0.3 mg/dL   Alkaline Phosphatase 71 39 - 117 U/L   AST 11 0 - 37 U/L   ALT 3 0 - 35 U/L   Total Protein 6.4 6.0 - 8.3 g/dL   Albumin 3.9 3.5 - 5.2 g/dL  TSH     Status: None   Collection Time: 12/14/22 12:31 PM  Result Value Ref Range   TSH 1.22 0.35 - 5.50 uIU/mL  CBC with Differential/Platelet      Status: Abnormal   Collection Time: 12/14/22 12:31 PM  Result Value Ref Range   WBC 8.8 4.0 - 10.5 K/uL   RBC 4.14 3.87 - 5.11 Mil/uL   Hemoglobin 11.8 (L) 12.0 - 15.0 g/dL   HCT 52.8 41.3 - 24.4 %   MCV 89.7 78.0 - 100.0 fl   MCHC 31.7 30.0 - 36.0 g/dL   RDW 01.0 27.2 - 53.6 %   Platelets 241.0 150.0 - 400.0 K/uL   Neutrophils Relative % 70.2 43.0 - 77.0 %   Lymphocytes Relative 22.1 12.0 - 46.0 %   Monocytes Relative 5.9 3.0 - 12.0 %   Eosinophils Relative 1.2 0.0 - 5.0 %   Basophils Relative 0.6 0.0 - 3.0 %   Neutro Abs 6.2 1.4 - 7.7 K/uL   Lymphs Abs 1.9 0.7 - 4.0 K/uL   Monocytes Absolute 0.5 0.1 - 1.0 K/uL   Eosinophils Absolute 0.1 0.0 - 0.7 K/uL   Basophils Absolute 0.1 0.0 - 0.1 K/uL  IBC + Ferritin     Status: None   Collection Time: 12/14/22 12:31 PM  Result Value Ref Range   Iron 69 42 - 145 ug/dL   Transferrin 644.0 347.4 - 360.0 mg/dL   Saturation Ratios 25.9 20.0 - 50.0 %   Ferritin 61.5 10.0 - 291.0 ng/mL   TIBC 298.2 250.0 - 450.0 mcg/dL    Assessment/Plan:  Carotid artery disease (HCC) Has not been checked in over 5 years.  Could be checked at her convenience in the future if she desires.  Hypertension blood pressure control important in reducing the progression of atherosclerotic disease. On appropriate oral medications.   Hyperlipidemia lipid control important in reducing the progression of atherosclerotic disease. Continue statin therapy   Swelling of lower extremity Recommend:  I have had a long discussion with the patient regarding swelling and why it  causes symptoms.  Patient will begin wearing graduated compression on a daily basis a prescription was given. The patient will  wear the stockings first thing in the morning and removing them in the evening. The patient is instructed specifically not to sleep in the stockings.  In addition, behavioral modification will be initiated.  This will include frequent elevation, use of over the counter  pain medications and exercise such as walking.  Consideration for a lymph pump will also be made based upon the effectiveness of conservative therapy.  This would help to improve the edema control and prevent sequela such as ulcers and infections   Patient should undergo duplex ultrasound of the venous system to ensure that DVT or reflux is not present.  The patient will follow-up with me after the ultrasound.       Festus Barren 02/12/2023, 2:59 PM   This note was created with Dragon medical transcription system.  Any errors from dictation are unintentional.

## 2023-02-12 NOTE — Assessment & Plan Note (Signed)
Recommend:  I have had a long discussion with the patient regarding swelling and why it  causes symptoms.  Patient will begin wearing graduated compression on a daily basis a prescription was given. The patient will  wear the stockings first thing in the morning and removing them in the evening. The patient is instructed specifically not to sleep in the stockings.   In addition, behavioral modification will be initiated.  This will include frequent elevation, use of over the counter pain medications and exercise such as walking.  Consideration for a lymph pump will also be made based upon the effectiveness of conservative therapy.  This would help to improve the edema control and prevent sequela such as ulcers and infections   Patient should undergo duplex ultrasound of the venous system to ensure that DVT or reflux is not present.  The patient will follow-up with me after the ultrasound.   

## 2023-02-12 NOTE — Assessment & Plan Note (Signed)
Has not been checked in over 5 years.  Could be checked at her convenience in the future if she desires.

## 2023-02-12 NOTE — Assessment & Plan Note (Signed)
blood pressure control important in reducing the progression of atherosclerotic disease. On appropriate oral medications.  

## 2023-02-21 DIAGNOSIS — M6281 Muscle weakness (generalized): Secondary | ICD-10-CM | POA: Diagnosis not present

## 2023-02-21 DIAGNOSIS — M25561 Pain in right knee: Secondary | ICD-10-CM | POA: Diagnosis not present

## 2023-02-21 DIAGNOSIS — G8929 Other chronic pain: Secondary | ICD-10-CM | POA: Diagnosis not present

## 2023-02-21 DIAGNOSIS — M25562 Pain in left knee: Secondary | ICD-10-CM | POA: Diagnosis not present

## 2023-02-22 ENCOUNTER — Ambulatory Visit: Payer: 59 | Admitting: Internal Medicine

## 2023-02-22 NOTE — Progress Notes (Deleted)
Subjective:    Patient ID: Victoria Hanson, female    DOB: 03-08-1951, 72 y.o.   MRN: [id number]  Patient here for No chief complaint on file.   HPI Here for follow up appt - f/u regarding her blood pressure and parkinsons. Seeing Dr Tat for f/u of parkinsons. On sinemet. Is moving around more/better. Had f/u 01/31/23. Saw GI 12/20/22 - f/u colon cancer and anemia.  Planning for colonoscopy 03/21/23. Saw ortho 01/08/23 - f/u knee pain.  With regards to her left knee she seems to be having primarily pes anserine bursa tendinitis and bursitis. Right knee - moderate to severe degenerative arthritis. Requested gel injections. Hinged knee brace, topical voltaren gel and PT for left knee. Had f/u 01/22/23, 01/29/23, 02/05/23 - s/p gel injection. Started PT. Saw [facility] 02/12/23 - continued leg swelling.  Recommended compression hose.    Past Medical History:  Diagnosis Date   Anxiety    Asthma    Atypical chest pain    a. 2003 Cath: reportedly nl per pt [name]);  b. 04/2015 [facility] Admission, neg troponin.   Colon cancer (HCC) 1999   a. 1999: Pt had partial colectomy. Did develop lung metastasis. Had right upper lobectomy in 01/2007 then had associated chemotherapy.   COPD (chronic obstructive pulmonary disease) (HCC)    DDD (degenerative disc disease), lumbosacral    Depression    Echocardiogram abnormal    a. 02/2010 Echo: EF 55-60%, mild LAE, no regional wall motion abnormalities   Elevated transaminase level    a. ? NASH;  b. 06/2015 - nl LFTs.   GERD (gastroesophageal reflux disease)    Hematuria    Holter monitor, abnormal    a. 12/2009 Holter monitoring: NSR, rare PACs and PVCs.   Hypercholesterolemia    Hyperlipidemia    Hypertension    Leaky heart valve    a. Per pt report - no evidence of valvular abnormalities on prior echoes.   Lesion of skin of Right breast 10/20/2017   Lung metastases 2008   a. S/P resection (lobectomy - right)   Morbid obesity (HCC)    Nephrolithiasis    pt  denies   Parkinson disease 08/2016   [name]   Past Surgical History:  Procedure Laterality Date   BREAST EXCISIONAL BIOPSY Right 10/07/2009   benign   CARDIAC CATHETERIZATION  2003   negative as per pt report   CHOLECYSTECTOMY  2000   COLON SURGERY     Colon cancer, removed part of large colon   COLONOSCOPY N/A 04/25/2015   Procedure: COLONOSCOPY;  Surgeon: Scot Jun, MD;  Location: [facility] ENDOSCOPY;  Service: Endoscopy;  Laterality: N/A;   COLONOSCOPY WITH PROPOFOL N/A 06/14/2017   Procedure: COLONOSCOPY WITH PROPOFOL;  Surgeon: Scot Jun, MD;  Location: [facility] ENDOSCOPY;  Service: Endoscopy;  Laterality: N/A;   COLONOSCOPY WITH PROPOFOL N/A 11/17/2019   Procedure: COLONOSCOPY WITH PROPOFOL;  Surgeon: Wyline Mood, MD;  Location: [facility] ENDOSCOPY;  Service: Gastroenterology;  Laterality: N/A;   COLONOSCOPY WITH PROPOFOL N/A 03/21/2021   Procedure: COLONOSCOPY WITH PROPOFOL;  Surgeon: Regis Bill, MD;  Location: [facility] ENDOSCOPY;  Service: Endoscopy;  Laterality: N/A;   ESOPHAGOGASTRODUODENOSCOPY (EGD) WITH PROPOFOL N/A 03/21/2021   Procedure: ESOPHAGOGASTRODUODENOSCOPY (EGD) WITH PROPOFOL;  Surgeon: Regis Bill, MD;  Location: [facility] ENDOSCOPY;  Service: Endoscopy;  Laterality: N/A;   LOBECTOMY  01/2007   Right lung, upper lobe right side removed   REPLACEMENT TOTAL KNEE Left 08/2020   TOTAL ABDOMINAL HYSTERECTOMY  abnormal bleeding   TUBAL LIGATION  1980   Family History  Problem Relation Age of Onset   Coronary artery disease Mother        died @ 30 of MI.   Mental illness Mother    Diabetes Mother    Heart attack Father 88       MI   Cancer Father        lung - died in his mid-70's   Hypertension Father    Diabetes Father    Sudden death Brother    Mental illness Brother    Heart attack Brother        MI @ 58, died @ age 61.   Hypertension Child    Breast cancer Neg Hx    Social History   Socioeconomic History   Marital status: Widowed     Spouse name: Not on file   Number of children: Not on file   Years of education: Not on file   Highest education level: Not on file  Occupational History   Not on file  Tobacco Use   Smoking status: Never    Passive exposure: Yes   Smokeless tobacco: Never   Tobacco comments:    husband and son smoked in her home.  Vaping Use   Vaping status: Never Used  Substance and Sexual Activity   Alcohol use: Not Currently    Alcohol/week: 0.0 standard drinks of alcohol   Drug use: No   Sexual activity: Never  Other Topics Concern   Not on file  Social History Narrative   Widowed.   Disabled (colon/lung CA), retired.   Lives in [place] with her dtr, grand-dtr, and son.   Active.   Gets regular exercise, walks.   Right handed    Social Determinants of Health   Financial Resource Strain: Low Risk  (01/07/2023)   Overall Financial Resource Strain (CARDIA)    Difficulty of Paying Living Expenses: Not hard at all  Food Insecurity: No Food Insecurity (01/07/2023)   Hunger Vital Sign    Worried About Running Out of Food in the Last Year: Never true    Ran Out of Food in the Last Year: Never true  Transportation Needs: No Transportation Needs (01/07/2023)   PRAPARE - Administrator, Civil Service (Medical): No    Lack of Transportation (Non-Medical): No  Physical Activity: Inactive (01/07/2023)   Exercise Vital Sign    Days of Exercise per Week: 0 days    Minutes of Exercise per Session: 0 min  Stress: Stress Concern Present (01/07/2023)   [employer] of Occupational Health - Occupational Stress Questionnaire    Feeling of Stress : Rather much  Social Connections: Moderately Integrated (01/07/2023)   Social Connection and Isolation Panel [NHANES]    Frequency of Communication with Friends and Family: More than three times a week    Frequency of Social Gatherings with Friends and Family: More than three times a week    Attends Religious Services: More than 4  times per year    Active Member of [employer] or Organizations: Yes    Attends Banker Meetings: More than 4 times per year    Marital Status: Widowed     Review of Systems     Objective:     There were no vitals taken for this visit. Wt Readings from Last 3 Encounters:  02/12/23 171 lb 3.2 oz (77.7 kg)  01/31/23 167 lb 3.2 oz (75.8 kg)  01/11/23  169 lb 12.8 oz (77 kg)    Physical Exam   Outpatient Encounter Medications as of 02/22/2023  Medication Sig   ALPRAZolam (XANAX) 0.5 MG tablet TAKE 1/2 TABLET BY MOUTH ONCE DAILY AS NEEDED   amLODipine (NORVASC) 5 MG tablet Take 5 mg by mouth daily.   aspirin EC 81 MG tablet Take 81 mg by mouth as needed.    Carbidopa-Levodopa ER (SINEMET CR) 25-100 MG tablet controlled release 2 at 7am/1 at 11am/1 at 4pm and 2 at BED   cholecalciferol (VITAMIN D3) 25 MCG (1000 UNIT) tablet Take 1,000 Units by mouth daily.   citalopram (CELEXA) 10 MG tablet Take 1 tablet (10 mg total) by mouth daily.   diclofenac Sodium (VOLTAREN ARTHRITIS PAIN) 1 % GEL Apply topically 4 (four) times daily.   fluticasone-salmeterol (ADVAIR HFA) 115-21 MCG/ACT inhaler Inhale 2 puffs into the lungs 2 (two) times daily.   furosemide (LASIX) 20 MG tablet Take 1 tablet (20 mg total) by mouth daily.   montelukast (SINGULAIR) 10 MG tablet TAKE 1 TABLET BY MOUTH AT BEDTIME   PROAIR HFA 108 (90 Base) MCG/ACT inhaler INHALE 2 PUFFS BY MOUTH INTO THE LUNGS EVERY 6 HOURS AS NEEDED FOR WHEEZING   rosuvastatin (CRESTOR) 5 MG tablet TAKE 1 TABLET BY MOUTH EVERY MONDAY, WEDNESDAY AND FRIDAY   Spacer/Aero-Holding Chambers DEVI 1 Units by Does not apply route daily.   traZODone (DESYREL) 50 MG tablet TAKE 2 TABLETS BY MOUTH AT BEDTIME   vitamin B-12 (CYANOCOBALAMIN) 100 MCG tablet Take 100 mcg by mouth daily.   vitamin E 180 MG (400 UNITS) capsule Take 400 Units by mouth daily.   No facility-administered encounter medications on file as of 02/22/2023.     Lab Results   Component Value Date   WBC 8.8 12/14/2022   HGB 11.8 (L) 12/14/2022   HCT 37.1 12/14/2022   PLT 241.0 12/14/2022   GLUCOSE 92 12/14/2022   CHOL 199 10/25/2022   TRIG 83.0 10/25/2022   HDL 61.50 10/25/2022   LDLDIRECT 163.4 12/31/2012   LDLCALC 121 (H) 10/25/2022   ALT 3 12/14/2022   AST 11 12/14/2022   NA 140 12/14/2022   K 4.1 12/14/2022   CL 103 12/14/2022   CREATININE 0.92 12/14/2022   BUN 19 12/14/2022   CO2 28 12/14/2022   TSH 1.22 12/14/2022   INR 1.0 12/12/2012   HGBA1C 6.0 10/25/2022    NM Bone Scan 3 Phase  Result Date: 09/21/2022 CLINICAL DATA:  LEFT knee arthroplasty EXAM: NUCLEAR MEDICINE 3-PHASE BONE SCAN TECHNIQUE: Radionuclide angiographic images, immediate static blood pool images, and 3-hour delayed static images were obtained of the knees after intravenous injection of radiopharmaceutical. RADIOPHARMACEUTICALS:  21.0 mCi Tc-62m MDP IV COMPARISON:  None Available. FINDINGS: Vascular phase: No asymmetric or increased blood flow to the LEFT or RIGHT knee. Blood pool phase: Mild asymmetric blood pool activity surrounding the LEFT knee joint. Delayed phase: Focal radiotracer activity accumulates within the LEFT patella. No abnormal radiotracer accumulation within the LEFT tibial plateau or femoral condyles. Photopenia noted associated with the LEFT knee prosthetic. Mild degenerative uptake in the medial LEFT knee compartment. IMPRESSION: 1. No evidence of loosening infection of the LEFT knee prosthetic. 2. Focal delayed uptake in the LEFT patella. Electronically Signed   By: Genevive Bi M.D.   On: 09/21/2022 10:57       Assessment & Plan:  There are no diagnoses linked to this encounter.   Dale [place], MD

## 2023-03-14 ENCOUNTER — Ambulatory Visit: Payer: 59 | Admitting: Internal Medicine

## 2023-03-14 ENCOUNTER — Encounter: Payer: Self-pay | Admitting: Internal Medicine

## 2023-03-14 VITALS — BP 132/70 | HR 73 | Temp 98.0°F | Resp 16 | Ht 63.0 in | Wt 175.4 lb

## 2023-03-14 DIAGNOSIS — I700 Atherosclerosis of aorta: Secondary | ICD-10-CM | POA: Diagnosis not present

## 2023-03-14 DIAGNOSIS — I779 Disorder of arteries and arterioles, unspecified: Secondary | ICD-10-CM | POA: Diagnosis not present

## 2023-03-14 DIAGNOSIS — K760 Fatty (change of) liver, not elsewhere classified: Secondary | ICD-10-CM

## 2023-03-14 DIAGNOSIS — M7989 Other specified soft tissue disorders: Secondary | ICD-10-CM

## 2023-03-14 DIAGNOSIS — N183 Chronic kidney disease, stage 3 unspecified: Secondary | ICD-10-CM

## 2023-03-14 DIAGNOSIS — R739 Hyperglycemia, unspecified: Secondary | ICD-10-CM

## 2023-03-14 DIAGNOSIS — E7800 Pure hypercholesterolemia, unspecified: Secondary | ICD-10-CM

## 2023-03-14 DIAGNOSIS — D649 Anemia, unspecified: Secondary | ICD-10-CM | POA: Diagnosis not present

## 2023-03-14 DIAGNOSIS — Z8589 Personal history of malignant neoplasm of other organs and systems: Secondary | ICD-10-CM

## 2023-03-14 DIAGNOSIS — Z23 Encounter for immunization: Secondary | ICD-10-CM | POA: Diagnosis not present

## 2023-03-14 DIAGNOSIS — M25562 Pain in left knee: Secondary | ICD-10-CM

## 2023-03-14 DIAGNOSIS — I773 Arterial fibromuscular dysplasia: Secondary | ICD-10-CM

## 2023-03-14 DIAGNOSIS — G20A1 Parkinson's disease without dyskinesia, without mention of fluctuations: Secondary | ICD-10-CM

## 2023-03-14 DIAGNOSIS — I10 Essential (primary) hypertension: Secondary | ICD-10-CM

## 2023-03-14 DIAGNOSIS — F339 Major depressive disorder, recurrent, unspecified: Secondary | ICD-10-CM

## 2023-03-14 MED ORDER — CITALOPRAM HYDROBROMIDE 10 MG PO TABS: ORAL_TABLET | ORAL | 1 refills | Status: DC

## 2023-03-14 NOTE — Progress Notes (Signed)
Subjective:    Patient ID: Victoria Hanson, female    DOB: 05-Apr-1951, 72 y.o.   MRN: [id number]  Patient here for  Chief Complaint  Patient presents with   Medical Management of Chronic Issues    HPI Here for follow up appt - f/u regarding her blood pressure and parkinsons. Seeing Dr Tat for f/u of parkinsons. S/p gel injection - knee. Knee is still an issue.  Discussed.  Wants to hold on any further intervention at this time.  Breathing stable.  No chest pain.  Eating.  No nausea or vomiting.  Bowels stable. Discussed f/u colonoscopy given history of colon cancer.  She desires to hold on f/u colonoscopy at this time. Discussed increased stress and anxiety.  Have tried various medications previously.  Is on low dose citalopram.  Takes xanax prn.  Would like to get her off the xanax.  Discussed.  Will increase citalopram to 1 and 1/2 tablet q day in hopes of being able to decrease xanax.     Past Medical History:  Diagnosis Date   Anxiety    Asthma    Atypical chest pain    a. 2003 Cath: reportedly nl per pt [name]);  b. 04/2015 [facility] Admission, neg troponin.   Colon cancer (HCC) 1999   a. 1999: Pt had partial colectomy. Did develop lung metastasis. Had right upper lobectomy in 01/2007 then had associated chemotherapy.   COPD (chronic obstructive pulmonary disease) (HCC)    DDD (degenerative disc disease), lumbosacral    Depression    Echocardiogram abnormal    a. 02/2010 Echo: EF 55-60%, mild LAE, no regional wall motion abnormalities   Elevated transaminase level    a. ? NASH;  b. 06/2015 - nl LFTs.   GERD (gastroesophageal reflux disease)    Hematuria    Holter monitor, abnormal    a. 12/2009 Holter monitoring: NSR, rare PACs and PVCs.   Hypercholesterolemia    Hyperlipidemia    Hypertension    Leaky heart valve    a. Per pt report - no evidence of valvular abnormalities on prior echoes.   Lesion of skin of Right breast 10/20/2017   Lung metastases 2008   a. S/P resection  (lobectomy - right)   Morbid obesity (HCC)    Nephrolithiasis    pt denies   Parkinson disease (HCC) 08/2016   [name]   Past Surgical History:  Procedure Laterality Date   BREAST EXCISIONAL BIOPSY Right 10/07/2009   benign   CARDIAC CATHETERIZATION  2003   negative as per pt report   CHOLECYSTECTOMY  2000   COLON SURGERY     Colon cancer, removed part of large colon   COLONOSCOPY N/A 04/25/2015   Procedure: COLONOSCOPY;  Surgeon: Scot Jun, MD;  Location: [facility] ENDOSCOPY;  Service: Endoscopy;  Laterality: N/A;   COLONOSCOPY WITH PROPOFOL N/A 06/14/2017   Procedure: COLONOSCOPY WITH PROPOFOL;  Surgeon: Scot Jun, MD;  Location: [facility] ENDOSCOPY;  Service: Endoscopy;  Laterality: N/A;   COLONOSCOPY WITH PROPOFOL N/A 11/17/2019   Procedure: COLONOSCOPY WITH PROPOFOL;  Surgeon: Wyline Mood, MD;  Location: [facility] ENDOSCOPY;  Service: Gastroenterology;  Laterality: N/A;   COLONOSCOPY WITH PROPOFOL N/A 03/21/2021   Procedure: COLONOSCOPY WITH PROPOFOL;  Surgeon: Regis Bill, MD;  Location: [facility] ENDOSCOPY;  Service: Endoscopy;  Laterality: N/A;   ESOPHAGOGASTRODUODENOSCOPY (EGD) WITH PROPOFOL N/A 03/21/2021   Procedure: ESOPHAGOGASTRODUODENOSCOPY (EGD) WITH PROPOFOL;  Surgeon: Regis Bill, MD;  Location: [facility] ENDOSCOPY;  Service: Endoscopy;  Laterality:  N/A;   LOBECTOMY  01/2007   Right lung, upper lobe right side removed   REPLACEMENT TOTAL KNEE Left 08/2020   TOTAL ABDOMINAL HYSTERECTOMY     abnormal bleeding   TUBAL LIGATION  1980   Family History  Problem Relation Age of Onset   Coronary artery disease Mother        died @ 66 of MI.   Mental illness Mother    Diabetes Mother    Heart attack Father 11       MI   Cancer Father        lung - died in his mid-70's   Hypertension Father    Diabetes Father    Sudden death Brother    Mental illness Brother    Heart attack Brother        MI @ 73, died @ age 55.   Hypertension Child    Breast cancer Neg  Hx    Social History   Socioeconomic History   Marital status: Widowed    Spouse name: Not on file   Number of children: Not on file   Years of education: Not on file   Highest education level: Not on file  Occupational History   Not on file  Tobacco Use   Smoking status: Never    Passive exposure: Yes   Smokeless tobacco: Never   Tobacco comments:    husband and son smoked in her home.  Vaping Use   Vaping status: Never Used  Substance and Sexual Activity   Alcohol use: Not Currently    Alcohol/week: 0.0 standard drinks of alcohol   Drug use: No   Sexual activity: Never  Other Topics Concern   Not on file  Social History Narrative   Widowed.   Disabled (colon/lung CA), retired.   Lives in [place] with her dtr, grand-dtr, and son.   Active.   Gets regular exercise, walks.   Right handed    Social Determinants of Health   Financial Resource Strain: Low Risk  (01/07/2023)   Overall Financial Resource Strain (CARDIA)    Difficulty of Paying Living Expenses: Not hard at all  Food Insecurity: No Food Insecurity (01/07/2023)   Hunger Vital Sign    Worried About Running Out of Food in the Last Year: Never true    Ran Out of Food in the Last Year: Never true  Transportation Needs: No Transportation Needs (01/07/2023)   PRAPARE - Administrator, Civil Service (Medical): No    Lack of Transportation (Non-Medical): No  Physical Activity: Inactive (01/07/2023)   Exercise Vital Sign    Days of Exercise per Week: 0 days    Minutes of Exercise per Session: 0 min  Stress: Stress Concern Present (01/07/2023)   [employer] of Occupational Health - Occupational Stress Questionnaire    Feeling of Stress : Rather much  Social Connections: Moderately Integrated (01/07/2023)   Social Connection and Isolation Panel [NHANES]    Frequency of Communication with Friends and Family: More than three times a week    Frequency of Social Gatherings with Friends and Family:  More than three times a week    Attends Religious Services: More than 4 times per year    Active Member of [employer] or Organizations: Yes    Attends Banker Meetings: More than 4 times per year    Marital Status: Widowed     Review of Systems  Constitutional:  Negative for appetite change and unexpected weight  change.  HENT:  Negative for congestion and sinus pressure.   Respiratory:  Negative for cough and chest tightness.        Breathing stable.   Cardiovascular:  Negative for chest pain and palpitations.  Gastrointestinal:  Negative for abdominal pain, diarrhea, nausea and vomiting.  Genitourinary:  Negative for difficulty urinating and dysuria.  Musculoskeletal:  Negative for joint swelling and myalgias.  Skin:  Negative for color change and rash.  Neurological:  Negative for dizziness and headaches.  Psychiatric/Behavioral:  Negative for agitation and dysphoric mood.        Objective:     BP 132/70   Pulse 73   Temp 98 F (36.7 C)   Resp 16   Ht 5\' 3"  (1.6 m)   Wt 175 lb 6.4 oz (79.6 kg)   SpO2 97%   BMI 31.07 kg/m  Wt Readings from Last 3 Encounters:  03/14/23 175 lb 6.4 oz (79.6 kg)  02/12/23 171 lb 3.2 oz (77.7 kg)  01/31/23 167 lb 3.2 oz (75.8 kg)    Physical Exam Vitals reviewed.  Constitutional:      General: She is not in acute distress.    Appearance: Normal appearance.  HENT:     Head: Normocephalic and atraumatic.     Right Ear: External ear normal.     Left Ear: External ear normal.  Eyes:     General: No scleral icterus.       Right eye: No discharge.        Left eye: No discharge.     Conjunctiva/sclera: Conjunctivae normal.  Neck:     Thyroid: No thyromegaly.  Cardiovascular:     Rate and Rhythm: Normal rate and regular rhythm.  Pulmonary:     Effort: No respiratory distress.     Breath sounds: Normal breath sounds. No wheezing.  Abdominal:     General: Bowel sounds are normal.     Palpations: Abdomen is soft.      Tenderness: There is no abdominal tenderness.  Musculoskeletal:        General: No swelling or tenderness.     Cervical back: Neck supple. No tenderness.  Lymphadenopathy:     Cervical: No cervical adenopathy.  Skin:    Findings: No erythema or rash.  Neurological:     Mental Status: She is alert.  Psychiatric:        Mood and Affect: Mood normal.        Behavior: Behavior normal.      Outpatient Encounter Medications as of 03/14/2023  Medication Sig   ALPRAZolam (XANAX) 0.5 MG tablet TAKE 1/2 TABLET BY MOUTH ONCE DAILY AS NEEDED   amLODipine (NORVASC) 5 MG tablet Take 5 mg by mouth daily.   aspirin EC 81 MG tablet Take 81 mg by mouth as needed.    Carbidopa-Levodopa ER (SINEMET CR) 25-100 MG tablet controlled release 2 at 7am/1 at 11am/1 at 4pm and 2 at BED   cholecalciferol (VITAMIN D3) 25 MCG (1000 UNIT) tablet Take 1,000 Units by mouth daily.   citalopram (CELEXA) 10 MG tablet Take 1 1/2 tablet q day   diclofenac Sodium (VOLTAREN ARTHRITIS PAIN) 1 % GEL Apply topically 4 (four) times daily.   fluticasone-salmeterol (ADVAIR HFA) 115-21 MCG/ACT inhaler Inhale 2 puffs into the lungs 2 (two) times daily.   furosemide (LASIX) 20 MG tablet Take 1 tablet (20 mg total) by mouth daily.   montelukast (SINGULAIR) 10 MG tablet TAKE 1 TABLET BY MOUTH AT BEDTIME  PROAIR HFA 108 (90 Base) MCG/ACT inhaler INHALE 2 PUFFS BY MOUTH INTO THE LUNGS EVERY 6 HOURS AS NEEDED FOR WHEEZING   rosuvastatin (CRESTOR) 5 MG tablet TAKE 1 TABLET BY MOUTH EVERY MONDAY, WEDNESDAY AND FRIDAY   Spacer/Aero-Holding Chambers DEVI 1 Units by Does not apply route daily.   traZODone (DESYREL) 50 MG tablet TAKE 2 TABLETS BY MOUTH AT BEDTIME   vitamin B-12 (CYANOCOBALAMIN) 100 MCG tablet Take 100 mcg by mouth daily.   vitamin E 180 MG (400 UNITS) capsule Take 400 Units by mouth daily.   [DISCONTINUED] citalopram (CELEXA) 10 MG tablet Take 1 tablet (10 mg total) by mouth daily.   No facility-administered encounter  medications on file as of 03/14/2023.     Lab Results  Component Value Date   WBC 9.4 03/14/2023   HGB 11.6 (L) 03/14/2023   HCT 35.3 (L) 03/14/2023   PLT 232.0 03/14/2023   GLUCOSE 117 (H) 03/14/2023   CHOL 193 03/14/2023   TRIG 173.0 (H) 03/14/2023   HDL 59.60 03/14/2023   LDLDIRECT 163.4 12/31/2012   LDLCALC 99 03/14/2023   ALT 2 03/14/2023   AST 12 03/14/2023   NA 139 03/14/2023   K 3.6 03/14/2023   CL 104 03/14/2023   CREATININE 0.98 03/14/2023   BUN 16 03/14/2023   CO2 27 03/14/2023   TSH 1.22 12/14/2022   INR 1.0 12/12/2012   HGBA1C 5.8 03/14/2023    NM Bone Scan 3 Phase  Result Date: 09/21/2022 CLINICAL DATA:  LEFT knee arthroplasty EXAM: NUCLEAR MEDICINE 3-PHASE BONE SCAN TECHNIQUE: Radionuclide angiographic images, immediate static blood pool images, and 3-hour delayed static images were obtained of the knees after intravenous injection of radiopharmaceutical. RADIOPHARMACEUTICALS:  21.0 mCi Tc-34m MDP IV COMPARISON:  None Available. FINDINGS: Vascular phase: No asymmetric or increased blood flow to the LEFT or RIGHT knee. Blood pool phase: Mild asymmetric blood pool activity surrounding the LEFT knee joint. Delayed phase: Focal radiotracer activity accumulates within the LEFT patella. No abnormal radiotracer accumulation within the LEFT tibial plateau or femoral condyles. Photopenia noted associated with the LEFT knee prosthetic. Mild degenerative uptake in the medial LEFT knee compartment. IMPRESSION: 1. No evidence of loosening infection of the LEFT knee prosthetic. 2. Focal delayed uptake in the LEFT patella. Electronically Signed   By: Genevive Bi M.D.   On: 09/21/2022 10:57       Assessment & Plan:  Hypercholesterolemia -     Lipid panel -     Hepatic function panel  Hyperglycemia Assessment & Plan: Low carb diet and exercise.  Follow met b and a1c.   Orders: -     Hemoglobin A1c  Anemia, unspecified type -     CBC with Differential/Platelet -      Basic metabolic panel  Need for influenza vaccination -     Flu Vaccine Trivalent High Dose (Fluad)  Swelling of lower extremity Assessment & Plan: Saw [facility].  Recommended compression hose.  Continue leg elevation.  Discussed lymphedema pump.    Pure hypercholesterolemia Assessment & Plan: Crestor. Low cholesterol diet and exercise.  Follow lipid panel and liver function tests.    Parkinson's disease, unspecified whether dyskinesia present, unspecified whether manifestations fluctuate [facility]) Assessment & Plan: Seeing Dr Arbutus Leas.  On sinemet - taking as outlined.  Has been doing better overall.  Follow.    Left knee pain, unspecified chronicity Assessment & Plan: [facility] 08/09/2022 - S/p left total knee arthroplasty (08/2022).  Swelling - scheduled for lower extremity ultrasound. F/u  [facility] 10/2022 - tylenol.  Also addressed - right knee pain - OA.  S/p gel injections.  Still with pain.  Continue f/u with ortho.    Primary hypertension Assessment & Plan: Varying pressures.  Hold on additional medication.  Off amlodipine. Lasix.   Follow metabolic panel.  Follow pressures.     History of malignant neoplasm metastatic to lung Assessment & Plan: S/p lobectomy.  Followed by pulmonary.  Albuterol inhaler if needed.    Hepatic steatosis Assessment & Plan: Diet and exercise.  Follow liver panel. Seen on chest CT angio - 09/11/21   Fibromuscular dysplasia (HCC) Assessment & Plan: Has been evaluated by [facility].  Continue risk factor modificaiton.    Depression, recurrent (HCC) Assessment & Plan: Continue citalopram.   Discussed taking xanax.  Would like to get her off of this medication.  Will increase citalopram to 1 1/2 tablet per day.  Follow. Doing better.    Stage 3 chronic kidney disease, unspecified whether stage 3a or 3b CKD (HCC) Assessment & Plan: Avoid antiinflammatories.  Off micardis due to low blood pressure.  Reports blood pressure varying.  Blood pressure ok today.  Taking  lasix prn. Follow pressures. Follow metabolic panel.    Bilateral carotid artery disease, unspecified type (HCC) Assessment & Plan: Previously evaluated by [facility].  Needs to continue crestor.     Aortic atherosclerosis (HCC) Assessment & Plan: Crestor.    Other orders -     Citalopram Hydrobromide; Take 1 1/2 tablet q day  Dispense: 135 tablet; Refill: 1     Dale [place], MD

## 2023-03-15 LAB — CBC WITH DIFFERENTIAL/PLATELET
Basophils Absolute: 0.1 10*3/uL (ref 0.0–0.1)
Basophils Relative: 1.1 % (ref 0.0–3.0)
Eosinophils Absolute: 0.1 10*3/uL (ref 0.0–0.7)
Eosinophils Relative: 1.3 % (ref 0.0–5.0)
HCT: 35.3 % — ABNORMAL LOW (ref 36.0–46.0)
Hemoglobin: 11.6 g/dL — ABNORMAL LOW (ref 12.0–15.0)
Lymphocytes Relative: 18.1 % (ref 12.0–46.0)
Lymphs Abs: 1.7 10*3/uL (ref 0.7–4.0)
MCHC: 32.7 g/dL (ref 30.0–36.0)
MCV: 88.9 fL (ref 78.0–100.0)
Monocytes Absolute: 0.7 10*3/uL (ref 0.1–1.0)
Monocytes Relative: 7.4 % (ref 3.0–12.0)
Neutro Abs: 6.7 10*3/uL (ref 1.4–7.7)
Neutrophils Relative %: 72.1 % (ref 43.0–77.0)
Platelets: 232 10*3/uL (ref 150.0–400.0)
RBC: 3.97 Mil/uL (ref 3.87–5.11)
RDW: 13.9 % (ref 11.5–15.5)
WBC: 9.4 10*3/uL (ref 4.0–10.5)

## 2023-03-15 LAB — HEPATIC FUNCTION PANEL
ALT: 2 U/L (ref 0–35)
AST: 12 U/L (ref 0–37)
Albumin: 3.7 g/dL (ref 3.5–5.2)
Alkaline Phosphatase: 71 U/L (ref 39–117)
Bilirubin, Direct: 0.1 mg/dL (ref 0.0–0.3)
Total Bilirubin: 0.3 mg/dL (ref 0.2–1.2)
Total Protein: 6.3 g/dL (ref 6.0–8.3)

## 2023-03-15 LAB — LIPID PANEL
Cholesterol: 193 mg/dL (ref 0–200)
HDL: 59.6 mg/dL (ref >39.00)
LDL Cholesterol: 99 mg/dL (ref 0–99)
NonHDL: 133.66
Total CHOL/HDL Ratio: 3
Triglycerides: 173 mg/dL — ABNORMAL HIGH (ref 0.0–149.0)
VLDL: 34.6 mg/dL (ref 0.0–40.0)

## 2023-03-15 LAB — BASIC METABOLIC PANEL
BUN: 16 mg/dL (ref 6–23)
CO2: 27 meq/L (ref 19–32)
Calcium: 9 mg/dL (ref 8.4–10.5)
Chloride: 104 meq/L (ref 96–112)
Creatinine, Ser: 0.98 mg/dL (ref 0.40–1.20)
GFR: 57.34 mL/min — ABNORMAL LOW (ref >60.00)
Glucose, Bld: 117 mg/dL — ABNORMAL HIGH (ref 70–99)
Potassium: 3.6 meq/L (ref 3.5–5.1)
Sodium: 139 meq/L (ref 135–145)

## 2023-03-15 LAB — HEMOGLOBIN A1C: Hgb A1c MFr Bld: 5.8 % (ref 4.6–6.5)

## 2023-03-17 ENCOUNTER — Encounter: Payer: Self-pay | Admitting: Internal Medicine

## 2023-03-17 NOTE — Assessment & Plan Note (Signed)
Saw [facility].  Recommended compression hose.  Continue leg elevation.  Discussed lymphedema pump.

## 2023-03-17 NOTE — Assessment & Plan Note (Signed)
Continue citalopram.   Discussed taking xanax.  Would like to get her off of this medication.  Will increase citalopram to 1 1/2 tablet per day.  Follow. Doing better.

## 2023-03-17 NOTE — Assessment & Plan Note (Signed)
Crestor. Low cholesterol diet and exercise.  Follow lipid panel and liver function tests.  

## 2023-03-17 NOTE — Assessment & Plan Note (Signed)
Varying pressures.  Hold on additional medication.  Off amlodipine. Lasix.   Follow metabolic panel.  Follow pressures.   

## 2023-03-17 NOTE — Assessment & Plan Note (Signed)
Previously evaluated by [facility].  Needs to continue crestor.   

## 2023-03-17 NOTE — Assessment & Plan Note (Signed)
Has been evaluated by [facility].  Continue risk factor modificaiton.  

## 2023-03-17 NOTE — Assessment & Plan Note (Signed)
Diet and exercise.  Follow liver panel. Seen on chest CT angio - 09/11/21 

## 2023-03-17 NOTE — Assessment & Plan Note (Signed)
Seeing Dr Tat.  On sinemet - taking as outlined.  Has been doing better overall.  Follow.

## 2023-03-17 NOTE — Assessment & Plan Note (Signed)
-   Crestor 

## 2023-03-17 NOTE — Assessment & Plan Note (Signed)
Low carb diet and exercise.  Follow met b and a1c.  

## 2023-03-17 NOTE — Assessment & Plan Note (Signed)
[facility] 08/09/2022 - S/p left total knee arthroplasty (08/2022).  Swelling - scheduled for lower extremity ultrasound. F/u [facility] 10/2022 - tylenol.  Also addressed - right knee pain - OA.  S/p gel injections.  Still with pain.  Continue f/u with ortho.

## 2023-03-17 NOTE — Assessment & Plan Note (Signed)
S/p lobectomy.  Followed by pulmonary.  Albuterol inhaler if needed.

## 2023-03-17 NOTE — Assessment & Plan Note (Signed)
Avoid antiinflammatories.  Off micardis due to low blood pressure.  Reports blood pressure varying.  Blood pressure ok today.  Taking lasix prn. Follow pressures. Follow metabolic panel.

## 2023-03-18 ENCOUNTER — Telehealth: Payer: Self-pay

## 2023-03-18 ENCOUNTER — Telehealth: Payer: Self-pay | Admitting: Gastroenterology

## 2023-03-18 ENCOUNTER — Telehealth: Payer: Self-pay | Admitting: *Deleted

## 2023-03-18 NOTE — Telephone Encounter (Signed)
Tried to reach patient-no answer or voicemail. But pt may try to call back. See results below

## 2023-03-18 NOTE — Telephone Encounter (Addendum)
-----   Message from [place] sent at 03/17/2023  2:12 PM EDT -----  Your hemoglobin remains stable.  Still slightly decreased but stable. Cholesterol has improved.  Currently on crestor 5mg  every Monday, Wednesday and Friday.  If agreeable, can increase to daily. (If agreeable, please let me know so that I send in a new prescription-confirm pharmacy also) Kidney function - stable overall.  Overall sugar control improved and ok. Liver function tests normal.

## 2023-03-18 NOTE — Telephone Encounter (Signed)
Received message on VM to cancel Colonoscopy-she stated her PCP said she din;t need at this time.spoke with [facility] it has been cancelled.

## 2023-03-18 NOTE — Telephone Encounter (Signed)
Patient called and labs were read. Patient states she has not been taking her crestor 5mg , and doesn't plan on taking it.

## 2023-03-18 NOTE — Telephone Encounter (Signed)
PT requesting call back to cancel and reschedule procedure

## 2023-03-18 NOTE — Telephone Encounter (Signed)
Patient called in to cancel her colonoscopy. The patient stated that she has discuss with it her PCP already.

## 2023-03-21 ENCOUNTER — Encounter: Admission: RE | Payer: Self-pay | Source: Home / Self Care

## 2023-03-21 ENCOUNTER — Ambulatory Visit: Admission: RE | Admit: 2023-03-21 | Payer: 59 | Source: Home / Self Care | Admitting: Gastroenterology

## 2023-03-21 SURGERY — COLONOSCOPY WITH PROPOFOL
Anesthesia: General

## 2023-03-22 ENCOUNTER — Other Ambulatory Visit: Payer: Self-pay | Admitting: Internal Medicine

## 2023-03-22 NOTE — Telephone Encounter (Signed)
Please refuse. Was sent in by Dr Meredeth Ide today per chart notes.

## 2023-03-22 NOTE — Telephone Encounter (Signed)
Need more information. Per review of chart, it appears advair last refilled by Dr Jayme Cloud in 2021.  Need to confirm if using, and how often.

## 2023-03-25 ENCOUNTER — Telehealth: Payer: Self-pay | Admitting: Neurology

## 2023-03-25 ENCOUNTER — Telehealth: Payer: Self-pay | Admitting: Internal Medicine

## 2023-03-25 MED ORDER — CITALOPRAM HYDROBROMIDE 20 MG PO TABS: 20.0000 mg | ORAL_TABLET | Freq: Every day | ORAL | 0 refills | Status: DC

## 2023-03-25 NOTE — Telephone Encounter (Signed)
Ok to try 20mg  celexa

## 2023-03-25 NOTE — Telephone Encounter (Signed)
Are you ok with her doing 20 mg of citalopram since the pills are too small for her to cut in half?

## 2023-03-25 NOTE — Telephone Encounter (Signed)
Number called x 2 isn't accepting calls, will call back in a bit

## 2023-03-25 NOTE — Telephone Encounter (Signed)
Patient states the citalopram (CELEXA) 10 MG tablet provider wanted her to take 1and half she says the are too tiny to cut in half. So can she just do the 20mg . Please advise

## 2023-03-25 NOTE — Telephone Encounter (Signed)
  Patient called stating that she is having problems with her BP. She is dizzy headed

## 2023-03-25 NOTE — Telephone Encounter (Signed)
LMTCB. I have sent in 20 mg citalopram for her to take 1 tablet daily.

## 2023-03-26 NOTE — Telephone Encounter (Signed)
Pt called in stating she accidentally blocked our phone number. She gave her daughter's phone number [phone] her name is Victoria Hanson.

## 2023-03-26 NOTE — Telephone Encounter (Signed)
Called patient and she informed me that yesterday she was experiencing dizziness because her blood pressure has been dropping. Patient states her blood pressure was 90/60. She has been drinking Gatorade and eating salt to bring it back up. Patient has been taking Sinemet 1.5 tablets four times a day and Norvasc 5 mg daily. She states she took the Norvasc later in the day yesterday because her temples and eyes were hurting and she felt her pressure was up. Patient states in the mornings is when her dizziness is happening and when her pressure is reading low. Today patient is ok, but had little dizziness earlier. Patient stated she does not take Midodrine.  Patient wants to know if Dr. Arbutus Leas would like to make any changes to her medication?

## 2023-03-26 NOTE — Telephone Encounter (Signed)
Noted  

## 2023-03-26 NOTE — Telephone Encounter (Signed)
Pt called back and I read the note and she verbalized understanding

## 2023-03-26 NOTE — Telephone Encounter (Signed)
Called patient and informed her that per Dr. Arbutus Leas She should midodrine as directed.  She should likely not take her BP medication since her BP is low.  Has she called her pcp that is giving her the norvasc.   Patient stated that she does not have the medication Midodrine and needs that sent in for her with directions. I asked patient to check her medications and she did not have it. I asked patient if she spoke to her PCP about her blood pressure and per patient the doctor was ok with her taking Norvasc as needed and to drink Gatorade.

## 2023-03-27 ENCOUNTER — Telehealth: Payer: Self-pay | Admitting: Internal Medicine

## 2023-03-27 NOTE — Telephone Encounter (Signed)
FYI- patient stated that she was discussing a medication that is supposed to bring BP up. (I think she is talking about midodrine.) Confirmed with patient that she is not having any acute issues with her BP. She was scheduled for an 8 week follow up and asked me to move her appt up. Scheduled her for a 4 week f/u with you and advised her to continue her current medications as directed until appointment. Pt agreed and was ok with this plan.

## 2023-03-27 NOTE — Telephone Encounter (Signed)
Patient just called and wanted to know if she can get on a medication called Melogrid. She said it suppose to help with her blood pressure. Her number is [phone]. Could someone call her and let her know.

## 2023-03-28 ENCOUNTER — Telehealth: Payer: Self-pay | Admitting: Neurology

## 2023-03-28 NOTE — Telephone Encounter (Signed)
Called patient and unable to leave a message as her voicemail is not set up.

## 2023-03-28 NOTE — Telephone Encounter (Signed)
Patient tried to call back and reach nurse,she had just missed the call

## 2023-04-02 ENCOUNTER — Telehealth: Payer: Self-pay | Admitting: Neurology

## 2023-04-02 NOTE — Telephone Encounter (Signed)
Patient has called stating she was returning a phone call.  Please use this number to get backin touch with her [phone]

## 2023-04-02 NOTE — Telephone Encounter (Signed)
Called patient and left a message with the caller to have her call our office. Caller will give patient the message.  Called patient house number and our number is blocked and our call is not accepted.

## 2023-04-02 NOTE — Telephone Encounter (Signed)
Pt called in returning Mahina's call. She says to call her daughter's number [phone]

## 2023-04-02 NOTE — Telephone Encounter (Signed)
See previous note

## 2023-04-02 NOTE — Telephone Encounter (Signed)
Called patient on her daughters phone and was informed that she is not home at the moment. Will call patient back.

## 2023-04-03 ENCOUNTER — Encounter (INDEPENDENT_AMBULATORY_CARE_PROVIDER_SITE_OTHER): Payer: Self-pay | Admitting: Nurse Practitioner

## 2023-04-03 ENCOUNTER — Ambulatory Visit (INDEPENDENT_AMBULATORY_CARE_PROVIDER_SITE_OTHER): Payer: 59 | Admitting: Nurse Practitioner

## 2023-04-03 ENCOUNTER — Ambulatory Visit (INDEPENDENT_AMBULATORY_CARE_PROVIDER_SITE_OTHER): Payer: 59

## 2023-04-03 VITALS — BP 133/75 | HR 67 | Resp 18 | Ht 63.0 in | Wt 173.4 lb

## 2023-04-03 DIAGNOSIS — I10 Essential (primary) hypertension: Secondary | ICD-10-CM | POA: Diagnosis not present

## 2023-04-03 DIAGNOSIS — I779 Disorder of arteries and arterioles, unspecified: Secondary | ICD-10-CM | POA: Diagnosis not present

## 2023-04-03 DIAGNOSIS — I872 Venous insufficiency (chronic) (peripheral): Secondary | ICD-10-CM

## 2023-04-03 DIAGNOSIS — M7989 Other specified soft tissue disorders: Secondary | ICD-10-CM | POA: Diagnosis not present

## 2023-04-03 NOTE — Telephone Encounter (Signed)
Victoria Hanson has been trying to reach this patient

## 2023-04-03 NOTE — Telephone Encounter (Signed)
See other encounter. I have not been able to get a hold of patient. She has our number blocked on her phone and uses her daughters phone. When calling daughter patient is not always with her making it hard to get a hold of patient.

## 2023-04-04 NOTE — Progress Notes (Signed)
Subjective:    Patient ID: Victoria Hanson, female    DOB: 01/23/1951, 72 y.o.   MRN: [id number] Chief Complaint  Patient presents with   Follow-up    pt conv BIL reflux.carotid    Victoria Hanson is a 72 year old female who presents today for evaluation of bilateral leg swelling.  She notes that her right leg is the most affected.  She has always had some swelling in her lower extremities but the left lower extremity significantly worsened following her left knee surgery.  This occurred in March 2023.  Since then her knee does not feel stable.  She notes that it feels loose and it feels as if is coming apart at times.  She feels as if there is something sticking out of the knee itself.  She has had x-rays that do confirm that there are ongoing issues with her knee and she has an upcoming evaluation with Dr. Audelia Acton.  She has worn compression previously but with her knee issues it is uncomfortable for her and they are difficult for her to put on and take off.  There are no open wounds or ulcerations.  No weeping.  No evidence of cellulitis noted today.  Today noninvasive studies show no evidence of DVT or superficial phlebitis bilaterally.  No evidence of deep venous insufficiency bilaterally.  There is evidence of superficial venous reflux noted bilaterally.  Additionally the patient has fibromuscular dysplasia and had a follow-up on her carotid disease today.  No evidence of significant stenosis noted in the bilateral internal carotid arteries.    Review of Systems  Cardiovascular:  Positive for leg swelling.  Musculoskeletal:  Positive for arthralgias.  All other systems reviewed and are negative.      Objective:   Physical Exam Vitals reviewed.  HENT:     Head: Normocephalic.  Cardiovascular:     Rate and Rhythm: Normal rate.  Pulmonary:     Effort: Pulmonary effort is normal.  Musculoskeletal:     Right lower leg: 2+ Edema present.     Left lower leg: 2+ Edema present.   Skin:    General: Skin is warm and dry.  Neurological:     Mental Status: She is alert and oriented to person, place, and time.     Gait: Gait abnormal.  Psychiatric:        Mood and Affect: Mood normal.        Behavior: Behavior normal.        Thought Content: Thought content normal.        Judgment: Judgment normal.     BP 133/75 (BP Location: Left Arm)   Pulse 67   Resp 18   Ht 5\' 3"  (1.6 m)   Wt 173 lb 6.4 oz (78.7 kg)   BMI 30.72 kg/m   Past Medical History:  Diagnosis Date   Anxiety    Asthma    Atypical chest pain    a. 2003 Cath: reportedly nl per pt [name]);  b. 04/2015 [facility] Admission, neg troponin.   Colon cancer (HCC) 1999   a. 1999: Pt had partial colectomy. Did develop lung metastasis. Had right upper lobectomy in 01/2007 then had associated chemotherapy.   COPD (chronic obstructive pulmonary disease) (HCC)    DDD (degenerative disc disease), lumbosacral    Depression    Echocardiogram abnormal    a. 02/2010 Echo: EF 55-60%, mild LAE, no regional wall motion abnormalities   Elevated transaminase level    a. ? NASH;  b.  06/2015 - nl LFTs.   GERD (gastroesophageal reflux disease)    Hematuria    Holter monitor, abnormal    a. 12/2009 Holter monitoring: NSR, rare PACs and PVCs.   Hypercholesterolemia    Hyperlipidemia    Hypertension    Leaky heart valve    a. Per pt report - no evidence of valvular abnormalities on prior echoes.   Lesion of skin of Right breast 10/20/2017   Lung metastases 2008   a. S/P resection (lobectomy - right)   Morbid obesity (HCC)    Nephrolithiasis    pt denies   Parkinson disease (HCC) 08/2016   [name]    Social History   Socioeconomic History   Marital status: Widowed    Spouse name: Not on file   Number of children: Not on file   Years of education: Not on file   Highest education level: Not on file  Occupational History   Not on file  Tobacco Use   Smoking status: Never    Passive exposure: Yes   Smokeless  tobacco: Never   Tobacco comments:    husband and son smoked in her home.  Vaping Use   Vaping status: Never Used  Substance and Sexual Activity   Alcohol use: Not Currently    Alcohol/week: 0.0 standard drinks of alcohol   Drug use: No   Sexual activity: Never  Other Topics Concern   Not on file  Social History Narrative   Widowed.   Disabled (colon/lung CA), retired.   Lives in [place] with her dtr, grand-dtr, and son.   Active.   Gets regular exercise, walks.   Right handed    Social Determinants of Health   Financial Resource Strain: Low Risk  (01/07/2023)   Overall Financial Resource Strain (CARDIA)    Difficulty of Paying Living Expenses: Not hard at all  Food Insecurity: No Food Insecurity (01/07/2023)   Hunger Vital Sign    Worried About Running Out of Food in the Last Year: Never true    Ran Out of Food in the Last Year: Never true  Transportation Needs: No Transportation Needs (01/07/2023)   PRAPARE - Administrator, Civil Service (Medical): No    Lack of Transportation (Non-Medical): No  Physical Activity: Inactive (01/07/2023)   Exercise Vital Sign    Days of Exercise per Week: 0 days    Minutes of Exercise per Session: 0 min  Stress: Stress Concern Present (01/07/2023)   [employer] of Occupational Health - Occupational Stress Questionnaire    Feeling of Stress : Rather much  Social Connections: Moderately Integrated (01/07/2023)   Social Connection and Isolation Panel [NHANES]    Frequency of Communication with Friends and Family: More than three times a week    Frequency of Social Gatherings with Friends and Family: More than three times a week    Attends Religious Services: More than 4 times per year    Active Member of [employer] or Organizations: Yes    Attends Banker Meetings: More than 4 times per year    Marital Status: Widowed  Intimate Partner Violence: Not At Risk (01/07/2023)   Humiliation, Afraid, Rape, and Kick  questionnaire    Fear of Current or Ex-Partner: No    Emotionally Abused: No    Physically Abused: No    Sexually Abused: No    Past Surgical History:  Procedure Laterality Date   BREAST EXCISIONAL BIOPSY Right 10/07/2009   benign   CARDIAC CATHETERIZATION  2003   negative as per pt report   CHOLECYSTECTOMY  2000   COLON SURGERY     Colon cancer, removed part of large colon   COLONOSCOPY N/A 04/25/2015   Procedure: COLONOSCOPY;  Surgeon: Scot Jun, MD;  [facility]COPY;  Service: Endoscopy;  Laterality: N/A;   COLONOSCOPY WITH PROPOFOL N/A 06/14/2017   Procedure: COLONOSCOPY WITH PROPOFOL;  Surgeon: Scot Jun, MD;  Location: [facility] ENDOSCOPY;  Service: Endoscopy;  Laterality: N/A;   COLONOSCOPY WITH PROPOFOL N/A 11/17/2019   Procedure: COLONOSCOPY WITH PROPOFOL;  Surgeon: Wyline Mood, MD;  Location: [facility] ENDOSCOPY;  Service: Gastroenterology;  Laterality: N/A;   COLONOSCOPY WITH PROPOFOL N/A 03/21/2021   Procedure: COLONOSCOPY WITH PROPOFOL;  Surgeon: Regis Bill, MD;  Location: [facility] ENDOSCOPY;  Service: Endoscopy;  Laterality: N/A;   ESOPHAGOGASTRODUODENOSCOPY (EGD) WITH PROPOFOL N/A 03/21/2021   Procedure: ESOPHAGOGASTRODUODENOSCOPY (EGD) WITH PROPOFOL;  Surgeon: Regis Bill, MD;  Location: [facility] ENDOSCOPY;  Service: Endoscopy;  Laterality: N/A;   LOBECTOMY  01/2007   Right lung, upper lobe right side removed   REPLACEMENT TOTAL KNEE Left 08/2020   TOTAL ABDOMINAL HYSTERECTOMY     abnormal bleeding   TUBAL LIGATION  1980    Family History  Problem Relation Age of Onset   Coronary artery disease Mother        died @ 37 of MI.   Mental illness Mother    Diabetes Mother    Heart attack Father 40       MI   Cancer Father        lung - died in his mid-70's   Hypertension Father    Diabetes Father    Sudden death Brother    Mental illness Brother    Heart attack Brother        MI @ 58, died @ age 8.   Hypertension Child    Breast  cancer Neg Hx     Allergies  Allergen Reactions   Macrobid [Nitrofurantoin Macrocrystal] Shortness Of Breath and Palpitations   Antihistamines, Diphenhydramine-Type Other (See Comments)    She does not know which antihistamine she has an allergic reaction to.   Aspirin Other (See Comments)    Makes her sick on the stomach if she takes too many and causes bleeding.  Of note, she can take ibuprofen. Other reaction(s): Other (See Comments) Makes her sick on the stomach.  Of note, she can take ibuprofen. Makes her sick on the stomach.  Of note, she can take ibuprofen.   Ceftin [Cefuroxime Axetil] Other (See Comments)    Chest pains   Celecoxib Other (See Comments)    Reaction: Blood Disorder GI bleeding.   Cymbalta [Duloxetine Hcl] Other (See Comments)    Reaction: unknown   Diphenhydramine Other (See Comments)   Escitalopram Other (See Comments)   Lexapro [Escitalopram Oxalate] Other (See Comments)    Reaction: Difficulty with speech   Metronidazole Other (See Comments)    Other reaction(s): Unknown   Nitrofurantoin Other (See Comments)   Zoloft [Sertraline Hcl]    Ciprofloxacin Other (See Comments)    Reaction: unknown       Latest Ref Rng & Units 03/14/2023    3:26 PM 12/14/2022   12:31 PM 10/25/2022    9:58 AM  CBC  WBC 4.0 - 10.5 K/uL 9.4  8.8  8.5   Hemoglobin 12.0 - 15.0 g/dL 47.8  29.5  62.1   Hematocrit 36.0 - 46.0 % 35.3  37.1  33.8  Platelets 150.0 - 400.0 K/uL 232.0  241.0  239.0       CMP     Component Value Date/Time   NA 139 03/14/2023 1526   NA 140 02/17/2021 1029   NA 134 (L) 09/01/2014 1359   K 3.6 03/14/2023 1526   K 4.1 09/01/2014 1359   CL 104 03/14/2023 1526   CL 103 09/01/2014 1359   CO2 27 03/14/2023 1526   CO2 27 09/01/2014 1359   GLUCOSE 117 (H) 03/14/2023 1526   GLUCOSE 101 (H) 09/01/2014 1359   BUN 16 03/14/2023 1526   BUN 18 02/17/2021 1029   BUN 23 (H) 09/01/2014 1359   CREATININE 0.98 03/14/2023 1526   CREATININE 1.16 (H)  01/14/2017 1217   CALCIUM 9.0 03/14/2023 1526   CALCIUM 8.8 (L) 09/01/2014 1359   PROT 6.3 03/14/2023 1526   PROT 7.2 09/01/2014 1359   ALBUMIN 3.7 03/14/2023 1526   ALBUMIN 3.7 09/01/2014 1359   AST 12 03/14/2023 1526   AST 16 09/01/2014 1359   ALT 2 03/14/2023 1526   ALT 9 (L) 09/01/2014 1359   ALKPHOS 71 03/14/2023 1526   ALKPHOS 85 09/01/2014 1359   BILITOT 0.3 03/14/2023 1526   BILITOT 0.4 09/01/2014 1359   GFR 57.34 (L) 03/14/2023 1526   EGFR 56 (L) 02/17/2021 1029   GFRNONAA 55 (L) 05/20/2022 1209   GFRNONAA 44 (L) 09/01/2014 1359     No results found.     Assessment & Plan:   1. Edema of both lower extremities due to peripheral venous insufficiency Today noninvasive studies show that the patient does have evidence of venous insufficiency in her bilateral great saphenous veins.  However her largest area of concern is her left leg in the area.  Her greatest complaints are related to the knee issues that she has ongoing.  I believe we will be in the patient's best interest to have this repaired prior to any interventions on her veins as there is not a guarantee that we will significantly decrease her edema with treatment of her venous insufficiency.  Will also try to work with her orthopedic surgeon in order to determine best options if adjustments to the plan are necessary.  Will plan to have the patient return in 6 months when she has been able to have the repair that is necessary to her knee but we can certainly see her sooner if her knee has improved  2. Primary hypertension Continue antihypertensive medications as already ordered, these medications have been reviewed and there are no changes at this time.  3. Bilateral carotid artery disease, unspecified type (HCC) No significant carotid disease noted today.  Patient has a history of fibromuscular dysplasia so we will plan on checking at least every other year.   Current Outpatient Medications on File Prior to Visit   Medication Sig Dispense Refill   ALPRAZolam (XANAX) 0.5 MG tablet TAKE 1/2 TABLET BY MOUTH ONCE DAILY AS NEEDED 30 tablet 0   amLODipine (NORVASC) 5 MG tablet Take 5 mg by mouth daily.     aspirin EC 81 MG tablet Take 81 mg by mouth as needed.      Carbidopa-Levodopa ER (SINEMET CR) 25-100 MG tablet controlled release 2 at 7am/1 at 11am/1 at 4pm and 2 at BED 540 tablet 1   cholecalciferol (VITAMIN D3) 25 MCG (1000 UNIT) tablet Take 1,000 Units by mouth daily.     citalopram (CELEXA) 20 MG tablet Take 1 tablet (20 mg total) by mouth daily. 30  tablet 0   diclofenac Sodium (VOLTAREN ARTHRITIS PAIN) 1 % GEL Apply topically 4 (four) times daily.     fluticasone-salmeterol (ADVAIR HFA) 115-21 MCG/ACT inhaler Inhale 2 puffs into the lungs 2 (two) times daily. 1 Inhaler 4   furosemide (LASIX) 20 MG tablet Take 1 tablet (20 mg total) by mouth daily. 30 tablet 11   montelukast (SINGULAIR) 10 MG tablet TAKE 1 TABLET BY MOUTH AT BEDTIME 30 tablet 2   PROAIR HFA 108 (90 Base) MCG/ACT inhaler INHALE 2 PUFFS BY MOUTH INTO THE LUNGS EVERY 6 HOURS AS NEEDED FOR WHEEZING 8.5 g 2   rosuvastatin (CRESTOR) 5 MG tablet TAKE 1 TABLET BY MOUTH EVERY MONDAY, WEDNESDAY AND FRIDAY 39 tablet 1   Spacer/Aero-Holding Chambers DEVI 1 Units by Does not apply route daily. 1 Units 0   traZODone (DESYREL) 50 MG tablet TAKE 2 TABLETS BY MOUTH AT BEDTIME 180 tablet 1   vitamin B-12 (CYANOCOBALAMIN) 100 MCG tablet Take 100 mcg by mouth daily.     vitamin E 180 MG (400 UNITS) capsule Take 400 Units by mouth daily.     No current facility-administered medications on file prior to visit.    There are no Patient Instructions on file for this visit. No follow-ups on file.   Georgiana Spinner, NP

## 2023-04-04 NOTE — Progress Notes (Incomplete)
Subjective:    Patient ID: Victoria Hanson, female    DOB: Jul 18, 1950, 72 y.o.   MRN: [id number] Chief Complaint  Patient presents with  . Follow-up    pt conv BIL reflux.carotid    HPI  Review of Systems  Cardiovascular:  Positive for leg swelling.  All other systems reviewed and are negative.      Objective:   Physical Exam Vitals reviewed.  HENT:     Head: Normocephalic.  Cardiovascular:     Rate and Rhythm: Normal rate.  Musculoskeletal:     Right lower leg: Edema present.     Left lower leg: Edema present.  Skin:    General: Skin is warm and dry.  Neurological:     Mental Status: She is alert and oriented to person, place, and time.     Gait: Gait abnormal.  Psychiatric:        Mood and Affect: Mood normal.        Behavior: Behavior normal.        Thought Content: Thought content normal.        Judgment: Judgment normal.     BP 133/75 (BP Location: Left Arm)   Pulse 67   Resp 18   Ht 5\' 3"  (1.6 m)   Wt 173 lb 6.4 oz (78.7 kg)   BMI 30.72 kg/m   Past Medical History:  Diagnosis Date  . Anxiety   . Asthma   . Atypical chest pain    a. 2003 Cath: reportedly nl per pt [name]);  b. 04/2015 [facility] Admission, neg troponin.  . Colon cancer (HCC) 1999   a. 1999: Pt had partial colectomy. Did develop lung metastasis. Had right upper lobectomy in 01/2007 then had associated chemotherapy.  Marland Kitchen COPD (chronic obstructive pulmonary disease) (HCC)   . DDD (degenerative disc disease), lumbosacral   . Depression   . Echocardiogram abnormal    a. 02/2010 Echo: EF 55-60%, mild LAE, no regional wall motion abnormalities  . Elevated transaminase level    a. ? NASH;  b. 06/2015 - nl LFTs.  . GERD (gastroesophageal reflux disease)   . Hematuria   . Holter monitor, abnormal    a. 12/2009 Holter monitoring: NSR, rare PACs and PVCs.  Marland Kitchen Hypercholesterolemia   . Hyperlipidemia   . Hypertension   . Leaky heart valve    a. Per pt report - no evidence of valvular  abnormalities on prior echoes.  . Lesion of skin of Right breast 10/20/2017  . Lung metastases 2008   a. S/P resection (lobectomy - right)  . Morbid obesity (HCC)   . Nephrolithiasis    pt denies  . Parkinson disease (HCC) 08/2016   [name]    Social History   Socioeconomic History  . Marital status: Widowed    Spouse name: Not on file  . Number of children: Not on file  . Years of education: Not on file  . Highest education level: Not on file  Occupational History  . Not on file  Tobacco Use  . Smoking status: Never    Passive exposure: Yes  . Smokeless tobacco: Never  . Tobacco comments:    husband and son smoked in her home.  Vaping Use  . Vaping status: Never Used  Substance and Sexual Activity  . Alcohol use: Not Currently    Alcohol/week: 0.0 standard drinks of alcohol  . Drug use: No  . Sexual activity: Never  Other Topics Concern  . Not on file  Social History Narrative   Widowed.   Disabled (colon/lung CA), retired.   Lives in [place] with her dtr, grand-dtr, and son.   Active.   Gets regular exercise, walks.   Right handed    Social Determinants of Health   Financial Resource Strain: Low Risk  (01/07/2023)   Overall Financial Resource Strain (CARDIA)   . Difficulty of Paying Living Expenses: Not hard at all  Food Insecurity: No Food Insecurity (01/07/2023)   Hunger Vital Sign   . Worried About Programme researcher, broadcasting/film/video in the Last Year: Never true   . Ran Out of Food in the Last Year: Never true  Transportation Needs: No Transportation Needs (01/07/2023)   PRAPARE - Transportation   . Lack of Transportation (Medical): No   . Lack of Transportation (Non-Medical): No  Physical Activity: Inactive (01/07/2023)   Exercise Vital Sign   . Days of Exercise per Week: 0 days   . Minutes of Exercise per Session: 0 min  Stress: Stress Concern Present (01/07/2023)   [employer] of Occupational Health - Occupational Stress Questionnaire   . Feeling of  Stress : Rather much  Social Connections: Moderately Integrated (01/07/2023)   Social Connection and Isolation Panel [NHANES]   . Frequency of Communication with Friends and Family: More than three times a week   . Frequency of Social Gatherings with Friends and Family: More than three times a week   . Attends Religious Services: More than 4 times per year   . Active Member of Clubs or Organizations: Yes   . Attends Banker Meetings: More than 4 times per year   . Marital Status: Widowed  Intimate Partner Violence: Not At Risk (01/07/2023)   Humiliation, Afraid, Rape, and Kick questionnaire   . Fear of Current or Ex-Partner: No   . Emotionally Abused: No   . Physically Abused: No   . Sexually Abused: No    Past Surgical History:  Procedure Laterality Date  . BREAST EXCISIONAL BIOPSY Right 10/07/2009   benign  . CARDIAC CATHETERIZATION  2003   negative as per pt report  . CHOLECYSTECTOMY  2000  . COLON SURGERY     Colon cancer, removed part of large colon  . COLONOSCOPY N/A 04/25/2015   Procedure: COLONOSCOPY;  Surgeon: Scot Jun, MD;  Location[facility]COPY;  Service: Endoscopy;  Laterality: N/A;  . COLONOSCOPY WITH PROPOFOL N/A 06/14/2017   Procedure: COLONOSCOPY WITH PROPOFOL;  Surgeon: Scot Jun, MD;  Location: [facility] ENDOSCOPY;  Service: Endoscopy;  Laterality: N/A;  . COLONOSCOPY WITH PROPOFOL N/A 11/17/2019   Procedure: COLONOSCOPY WITH PROPOFOL;  Surgeon: Wyline Mood, MD;  Location: [facility] ENDOSCOPY;  Service: Gastroenterology;  Laterality: N/A;  . COLONOSCOPY WITH PROPOFOL N/A 03/21/2021   Procedure: COLONOSCOPY WITH PROPOFOL;  Surgeon: Regis Bill, MD;  Location: [facility] ENDOSCOPY;  Service: Endoscopy;  Laterality: N/A;  . ESOPHAGOGASTRODUODENOSCOPY (EGD) WITH PROPOFOL N/A 03/21/2021   Procedure: ESOPHAGOGASTRODUODENOSCOPY (EGD) WITH PROPOFOL;  Surgeon: Regis Bill, MD;  Location: [facility] ENDOSCOPY;  Service: Endoscopy;  Laterality: N/A;   . LOBECTOMY  01/2007   Right lung, upper lobe right side removed  . REPLACEMENT TOTAL KNEE Left 08/2020  . TOTAL ABDOMINAL HYSTERECTOMY     abnormal bleeding  . TUBAL LIGATION  1980    Family History  Problem Relation Age of Onset  . Coronary artery disease Mother        died @ 55 of MI.  . Mental illness Mother   .  Diabetes Mother   . Heart attack Father 65       MI  . Cancer Father        lung - died in his mid-70's  . Hypertension Father   . Diabetes Father   . Sudden death Brother   . Mental illness Brother   . Heart attack Brother        MI @ 4, died @ age 31.  Marland Kitchen Hypertension Child   . Breast cancer Neg Hx     Allergies  Allergen Reactions  . Macrobid [Nitrofurantoin Macrocrystal] Shortness Of Breath and Palpitations  . Antihistamines, Diphenhydramine-Type Other (See Comments)    She does not know which antihistamine she has an allergic reaction to.  . Aspirin Other (See Comments)    Makes her sick on the stomach if she takes too many and causes bleeding.  Of note, she can take ibuprofen. Other reaction(s): Other (See Comments) Makes her sick on the stomach.  Of note, she can take ibuprofen. Makes her sick on the stomach.  Of note, she can take ibuprofen.  . Ceftin [Cefuroxime Axetil] Other (See Comments)    Chest pains  . Celecoxib Other (See Comments)    Reaction: Blood Disorder GI bleeding.  Marland Kitchen Cymbalta [Duloxetine Hcl] Other (See Comments)    Reaction: unknown  . Diphenhydramine Other (See Comments)  . Escitalopram Other (See Comments)  . Lexapro [Escitalopram Oxalate] Other (See Comments)    Reaction: Difficulty with speech  . Metronidazole Other (See Comments)    Other reaction(s): Unknown  . Nitrofurantoin Other (See Comments)  . Zoloft [Sertraline Hcl]   . Ciprofloxacin Other (See Comments)    Reaction: unknown       Latest Ref Rng & Units 03/14/2023    3:26 PM 12/14/2022   12:31 PM 10/25/2022    9:58 AM  CBC  WBC 4.0 - 10.5 K/uL 9.4  8.8  8.5    Hemoglobin 12.0 - 15.0 g/dL 60.4  54.0  98.1   Hematocrit 36.0 - 46.0 % 35.3  37.1  33.8   Platelets 150.0 - 400.0 K/uL 232.0  241.0  239.0       CMP     Component Value Date/Time   NA 139 03/14/2023 1526   NA 140 02/17/2021 1029   NA 134 (L) 09/01/2014 1359   K 3.6 03/14/2023 1526   K 4.1 09/01/2014 1359   CL 104 03/14/2023 1526   CL 103 09/01/2014 1359   CO2 27 03/14/2023 1526   CO2 27 09/01/2014 1359   GLUCOSE 117 (H) 03/14/2023 1526   GLUCOSE 101 (H) 09/01/2014 1359   BUN 16 03/14/2023 1526   BUN 18 02/17/2021 1029   BUN 23 (H) 09/01/2014 1359   CREATININE 0.98 03/14/2023 1526   CREATININE 1.16 (H) 01/14/2017 1217   CALCIUM 9.0 03/14/2023 1526   CALCIUM 8.8 (L) 09/01/2014 1359   PROT 6.3 03/14/2023 1526   PROT 7.2 09/01/2014 1359   ALBUMIN 3.7 03/14/2023 1526   ALBUMIN 3.7 09/01/2014 1359   AST 12 03/14/2023 1526   AST 16 09/01/2014 1359   ALT 2 03/14/2023 1526   ALT 9 (L) 09/01/2014 1359   ALKPHOS 71 03/14/2023 1526   ALKPHOS 85 09/01/2014 1359   BILITOT 0.3 03/14/2023 1526   BILITOT 0.4 09/01/2014 1359   GFR 57.34 (L) 03/14/2023 1526   EGFR 56 (L) 02/17/2021 1029   GFRNONAA 55 (L) 05/20/2022 1209   GFRNONAA 44 (L) 09/01/2014 1359     No  results found.     Assessment & Plan:   1. Edema of both lower extremities due to peripheral venous insufficiency Today noninvasive studies show that the patient does have evidence of venous insufficiency in her bilateral great saphenous veins.  However her largest area of concern is her left leg in the area.  Her greatest complaints are related to the knee issues that she has ongoing.  I believe we will be in the patient's best interest to have this repaired prior to any interventions on her veins as there is not a guarantee that we will significantly decrease her edema with treatment of her venous insufficiency.  Will also try to work with her orthopedic surgeon in order to determine best options if adjustments to the  plan are necessary.  Will plan to have the patient return in 6 months when she has been able to have the repair that is necessary to her knee but we can certainly see her sooner if her knee has improved  2. Primary hypertension Continue antihypertensive medications as already ordered, these medications have been reviewed and there are no changes at this time.  3. Bilateral carotid artery disease, unspecified type (HCC) No significant carotid disease noted today.  Patient has a history of fibromuscular dysplasia so we will plan on checking at least every other year.   Current Outpatient Medications on File Prior to Visit  Medication Sig Dispense Refill  . ALPRAZolam (XANAX) 0.5 MG tablet TAKE 1/2 TABLET BY MOUTH ONCE DAILY AS NEEDED 30 tablet 0  . amLODipine (NORVASC) 5 MG tablet Take 5 mg by mouth daily.    Marland Kitchen aspirin EC 81 MG tablet Take 81 mg by mouth as needed.     . Carbidopa-Levodopa ER (SINEMET CR) 25-100 MG tablet controlled release 2 at 7am/1 at 11am/1 at 4pm and 2 at BED 540 tablet 1  . cholecalciferol (VITAMIN D3) 25 MCG (1000 UNIT) tablet Take 1,000 Units by mouth daily.    . citalopram (CELEXA) 20 MG tablet Take 1 tablet (20 mg total) by mouth daily. 30 tablet 0  . diclofenac Sodium (VOLTAREN ARTHRITIS PAIN) 1 % GEL Apply topically 4 (four) times daily.    . fluticasone-salmeterol (ADVAIR HFA) 115-21 MCG/ACT inhaler Inhale 2 puffs into the lungs 2 (two) times daily. 1 Inhaler 4  . furosemide (LASIX) 20 MG tablet Take 1 tablet (20 mg total) by mouth daily. 30 tablet 11  . montelukast (SINGULAIR) 10 MG tablet TAKE 1 TABLET BY MOUTH AT BEDTIME 30 tablet 2  . PROAIR HFA 108 (90 Base) MCG/ACT inhaler INHALE 2 PUFFS BY MOUTH INTO THE LUNGS EVERY 6 HOURS AS NEEDED FOR WHEEZING 8.5 g 2  . rosuvastatin (CRESTOR) 5 MG tablet TAKE 1 TABLET BY MOUTH EVERY MONDAY, WEDNESDAY AND FRIDAY 39 tablet 1  . Spacer/Aero-Holding Chambers DEVI 1 Units by Does not apply route daily. 1 Units 0  . traZODone  (DESYREL) 50 MG tablet TAKE 2 TABLETS BY MOUTH AT BEDTIME 180 tablet 1  . vitamin B-12 (CYANOCOBALAMIN) 100 MCG tablet Take 100 mcg by mouth daily.    . vitamin E 180 MG (400 UNITS) capsule Take 400 Units by mouth daily.     No current facility-administered medications on file prior to visit.    There are no Patient Instructions on file for this visit. No follow-ups on file.   Georgiana Spinner, NP

## 2023-04-05 DIAGNOSIS — M25562 Pain in left knee: Secondary | ICD-10-CM | POA: Diagnosis not present

## 2023-04-05 DIAGNOSIS — M7050 Other bursitis of knee, unspecified knee: Secondary | ICD-10-CM | POA: Diagnosis not present

## 2023-04-16 ENCOUNTER — Encounter: Payer: Self-pay | Admitting: Internal Medicine

## 2023-04-16 ENCOUNTER — Ambulatory Visit: Payer: 59 | Admitting: Internal Medicine

## 2023-04-16 VITALS — BP 130/72 | HR 65 | Temp 98.0°F | Resp 16 | Ht 63.0 in | Wt 173.4 lb

## 2023-04-16 DIAGNOSIS — I779 Disorder of arteries and arterioles, unspecified: Secondary | ICD-10-CM | POA: Diagnosis not present

## 2023-04-16 DIAGNOSIS — I700 Atherosclerosis of aorta: Secondary | ICD-10-CM

## 2023-04-16 DIAGNOSIS — E559 Vitamin D deficiency, unspecified: Secondary | ICD-10-CM | POA: Diagnosis not present

## 2023-04-16 DIAGNOSIS — D649 Anemia, unspecified: Secondary | ICD-10-CM

## 2023-04-16 DIAGNOSIS — M7989 Other specified soft tissue disorders: Secondary | ICD-10-CM

## 2023-04-16 DIAGNOSIS — I10 Essential (primary) hypertension: Secondary | ICD-10-CM | POA: Diagnosis not present

## 2023-04-16 DIAGNOSIS — F339 Major depressive disorder, recurrent, unspecified: Secondary | ICD-10-CM

## 2023-04-16 DIAGNOSIS — E7800 Pure hypercholesterolemia, unspecified: Secondary | ICD-10-CM

## 2023-04-16 DIAGNOSIS — E782 Mixed hyperlipidemia: Secondary | ICD-10-CM

## 2023-04-16 DIAGNOSIS — N183 Chronic kidney disease, stage 3 unspecified: Secondary | ICD-10-CM | POA: Diagnosis not present

## 2023-04-16 DIAGNOSIS — R5383 Other fatigue: Secondary | ICD-10-CM

## 2023-04-16 DIAGNOSIS — I773 Arterial fibromuscular dysplasia: Secondary | ICD-10-CM | POA: Diagnosis not present

## 2023-04-16 DIAGNOSIS — K760 Fatty (change of) liver, not elsewhere classified: Secondary | ICD-10-CM | POA: Diagnosis not present

## 2023-04-16 DIAGNOSIS — G20A1 Parkinson's disease without dyskinesia, without mention of fluctuations: Secondary | ICD-10-CM

## 2023-04-16 DIAGNOSIS — K219 Gastro-esophageal reflux disease without esophagitis: Secondary | ICD-10-CM | POA: Diagnosis not present

## 2023-04-16 DIAGNOSIS — F419 Anxiety disorder, unspecified: Secondary | ICD-10-CM

## 2023-04-16 DIAGNOSIS — R739 Hyperglycemia, unspecified: Secondary | ICD-10-CM

## 2023-04-16 DIAGNOSIS — M25562 Pain in left knee: Secondary | ICD-10-CM

## 2023-04-16 NOTE — Progress Notes (Signed)
Subjective:    Patient ID: NIMO WHILLOCK, female    DOB: July 28, 1950, 72 y.o.   MRN: [id number]  Patient here for  Chief Complaint  Patient presents with   Medical Management of Chronic Issues    HPI Here for a scheduled follow up. Here for follow up appt - f/u regarding her blood pressure and parkinsons. Seeing Dr Tat for f/u of parkinsons. Evaluated by [facility] 04/03/23 - lower extremity swelling. Ultrasound - no DVT. Having persistent knee issues.  [facility] recommended f/u for knee issues first. Ortho evaluation 04/05/23 - recommended PT and hinged knee brace. Taking the increased dose of citalopram.  Feels she is doing better on this medication.  Breathing stable. No nausea or vomiting reported.    Past Medical History:  Diagnosis Date   Anxiety    Asthma    Atypical chest pain    a. 2003 Cath: reportedly nl per pt [name]);  b. 04/2015 [facility] Admission, neg troponin.   Colon cancer (HCC) 1999   a. 1999: Pt had partial colectomy. Did develop lung metastasis. Had right upper lobectomy in 01/2007 then had associated chemotherapy.   COPD (chronic obstructive pulmonary disease) (HCC)    DDD (degenerative disc disease), lumbosacral    Depression    Echocardiogram abnormal    a. 02/2010 Echo: EF 55-60%, mild LAE, no regional wall motion abnormalities   Elevated transaminase level    a. ? NASH;  b. 06/2015 - nl LFTs.   GERD (gastroesophageal reflux disease)    Hematuria    Holter monitor, abnormal    a. 12/2009 Holter monitoring: NSR, rare PACs and PVCs.   Hypercholesterolemia    Hyperlipidemia    Hypertension    Leaky heart valve    a. Per pt report - no evidence of valvular abnormalities on prior echoes.   Lesion of skin of Right breast 10/20/2017   Lung metastases 2008   a. S/P resection (lobectomy - right)   Morbid obesity (HCC)    Nephrolithiasis    pt denies   Parkinson disease (HCC) 08/2016   [name]   Past Surgical History:  Procedure Laterality Date   BREAST EXCISIONAL  BIOPSY Right 10/07/2009   benign   CARDIAC CATHETERIZATION  2003   negative as per pt report   CHOLECYSTECTOMY  2000   COLON SURGERY     Colon cancer, removed part of large colon   COLONOSCOPY N/A 04/25/2015   Procedure: COLONOSCOPY;  Surgeon: Scot Jun, MD;  Location: [facility] ENDOSCOPY;  Service: Endoscopy;  Laterality: N/A;   COLONOSCOPY WITH PROPOFOL N/A 06/14/2017   Procedure: COLONOSCOPY WITH PROPOFOL;  Surgeon: Scot Jun, MD;  Location: [facility] ENDOSCOPY;  Service: Endoscopy;  Laterality: N/A;   COLONOSCOPY WITH PROPOFOL N/A 11/17/2019   Procedure: COLONOSCOPY WITH PROPOFOL;  Surgeon: Wyline Mood, MD;  Location: [facility] ENDOSCOPY;  Service: Gastroenterology;  Laterality: N/A;   COLONOSCOPY WITH PROPOFOL N/A 03/21/2021   Procedure: COLONOSCOPY WITH PROPOFOL;  Surgeon: Regis Bill, MD;  Location: [facility] ENDOSCOPY;  Service: Endoscopy;  Laterality: N/A;   ESOPHAGOGASTRODUODENOSCOPY (EGD) WITH PROPOFOL N/A 03/21/2021   Procedure: ESOPHAGOGASTRODUODENOSCOPY (EGD) WITH PROPOFOL;  Surgeon: Regis Bill, MD;  Location: [facility] ENDOSCOPY;  Service: Endoscopy;  Laterality: N/A;   LOBECTOMY  01/2007   Right lung, upper lobe right side removed   REPLACEMENT TOTAL KNEE Left 08/2020   TOTAL ABDOMINAL HYSTERECTOMY     abnormal bleeding   TUBAL LIGATION  1980   Family History  Problem Relation Age of  Onset   Coronary artery disease Mother        died @ 60 of MI.   Mental illness Mother    Diabetes Mother    Heart attack Father 65       MI   Cancer Father        lung - died in his mid-70's   Hypertension Father    Diabetes Father    Sudden death Brother    Mental illness Brother    Heart attack Brother        MI @ 83, died @ age 42.   Hypertension Child    Breast cancer Neg Hx    Social History   Socioeconomic History   Marital status: Widowed    Spouse name: Not on file   Number of children: Not on file   Years of education: Not on file   Highest education  level: Not on file  Occupational History   Not on file  Tobacco Use   Smoking status: Never    Passive exposure: Yes   Smokeless tobacco: Never   Tobacco comments:    husband and son smoked in her home.  Vaping Use   Vaping status: Never Used  Substance and Sexual Activity   Alcohol use: Not Currently    Alcohol/week: 0.0 standard drinks of alcohol   Drug use: No   Sexual activity: Never  Other Topics Concern   Not on file  Social History Narrative   Widowed.   Disabled (colon/lung CA), retired.   Lives in [place] with her dtr, grand-dtr, and son.   Active.   Gets regular exercise, walks.   Right handed    Social Determinants of Health   Financial Resource Strain: Low Risk  (01/07/2023)   Overall Financial Resource Strain (CARDIA)    Difficulty of Paying Living Expenses: Not hard at all  Food Insecurity: No Food Insecurity (01/07/2023)   Hunger Vital Sign    Worried About Running Out of Food in the Last Year: Never true    Ran Out of Food in the Last Year: Never true  Transportation Needs: No Transportation Needs (01/07/2023)   PRAPARE - Administrator, Civil Service (Medical): No    Lack of Transportation (Non-Medical): No  Physical Activity: Inactive (01/07/2023)   Exercise Vital Sign    Days of Exercise per Week: 0 days    Minutes of Exercise per Session: 0 min  Stress: Stress Concern Present (01/07/2023)   [employer] of Occupational Health - Occupational Stress Questionnaire    Feeling of Stress : Rather much  Social Connections: Moderately Integrated (01/07/2023)   Social Connection and Isolation Panel [NHANES]    Frequency of Communication with Friends and Family: More than three times a week    Frequency of Social Gatherings with Friends and Family: More than three times a week    Attends Religious Services: More than 4 times per year    Active Member of [employer] or Organizations: Yes    Attends Banker Meetings: More than 4  times per year    Marital Status: Widowed     Review of Systems  Constitutional:  Negative for appetite change and unexpected weight change.  HENT:  Negative for congestion and sinus pressure.   Respiratory:  Negative for cough, chest tightness and shortness of breath.   Cardiovascular:  Negative for chest pain and palpitations.  Gastrointestinal:  Negative for abdominal pain, diarrhea, nausea and vomiting.  Genitourinary:  Negative  for difficulty urinating and dysuria.  Musculoskeletal:  Negative for myalgias.       Persistent knee pain as outlined.   Skin:  Negative for color change and rash.  Neurological:  Negative for dizziness and headaches.  Psychiatric/Behavioral:  Negative for agitation and dysphoric mood.        Objective:     BP 130/72   Pulse 65   Temp 98 F (36.7 C)   Resp 16   Ht 5\' 3"  (1.6 m)   Wt 173 lb 6.4 oz (78.7 kg)   SpO2 97%   BMI 30.72 kg/m  Wt Readings from Last 3 Encounters:  04/16/23 173 lb 6.4 oz (78.7 kg)  04/03/23 173 lb 6.4 oz (78.7 kg)  03/14/23 175 lb 6.4 oz (79.6 kg)    Physical Exam Vitals reviewed.  Constitutional:      General: She is not in acute distress.    Appearance: Normal appearance.  HENT:     Head: Normocephalic and atraumatic.     Right Ear: External ear normal.     Left Ear: External ear normal.  Eyes:     General: No scleral icterus.       Right eye: No discharge.        Left eye: No discharge.     Conjunctiva/sclera: Conjunctivae normal.  Neck:     Thyroid: No thyromegaly.  Cardiovascular:     Rate and Rhythm: Normal rate and regular rhythm.  Pulmonary:     Effort: No respiratory distress.     Breath sounds: Normal breath sounds. No wheezing.  Abdominal:     General: Bowel sounds are normal.     Palpations: Abdomen is soft.     Tenderness: There is no abdominal tenderness.  Musculoskeletal:        General: No swelling or tenderness.     Cervical back: Neck supple. No tenderness.  Lymphadenopathy:      Cervical: No cervical adenopathy.  Skin:    Findings: No erythema or rash.  Neurological:     Mental Status: She is alert.  Psychiatric:        Mood and Affect: Mood normal.        Behavior: Behavior normal.      Outpatient Encounter Medications as of 04/16/2023  Medication Sig   ALPRAZolam (XANAX) 0.5 MG tablet TAKE 1/2 TABLET BY MOUTH ONCE DAILY AS NEEDED   amLODipine (NORVASC) 5 MG tablet Take 5 mg by mouth daily.   aspirin EC 81 MG tablet Take 81 mg by mouth as needed.    Carbidopa-Levodopa ER (SINEMET CR) 25-100 MG tablet controlled release 2 at 7am/1 at 11am/1 at 4pm and 2 at BED   cholecalciferol (VITAMIN D3) 25 MCG (1000 UNIT) tablet Take 1,000 Units by mouth daily.   citalopram (CELEXA) 20 MG tablet Take 1 tablet (20 mg total) by mouth daily.   diclofenac Sodium (VOLTAREN ARTHRITIS PAIN) 1 % GEL Apply topically 4 (four) times daily.   fluticasone-salmeterol (ADVAIR HFA) 115-21 MCG/ACT inhaler Inhale 2 puffs into the lungs 2 (two) times daily.   furosemide (LASIX) 20 MG tablet Take 1 tablet (20 mg total) by mouth daily.   PROAIR HFA 108 (90 Base) MCG/ACT inhaler INHALE 2 PUFFS BY MOUTH INTO THE LUNGS EVERY 6 HOURS AS NEEDED FOR WHEEZING   rosuvastatin (CRESTOR) 5 MG tablet TAKE 1 TABLET BY MOUTH EVERY MONDAY, WEDNESDAY AND FRIDAY   Spacer/Aero-Holding Chambers DEVI 1 Units by Does not apply route daily.   traZODone (DESYREL)  50 MG tablet TAKE 2 TABLETS BY MOUTH AT BEDTIME   vitamin B-12 (CYANOCOBALAMIN) 100 MCG tablet Take 100 mcg by mouth daily.   vitamin E 180 MG (400 UNITS) capsule Take 400 Units by mouth daily.   [DISCONTINUED] montelukast (SINGULAIR) 10 MG tablet TAKE 1 TABLET BY MOUTH AT BEDTIME   No facility-administered encounter medications on file as of 04/16/2023.     Lab Results  Component Value Date   WBC 9.9 04/16/2023   HGB 12.0 04/16/2023   HCT 36.7 04/16/2023   PLT 250.0 04/16/2023   GLUCOSE 107 (H) 04/16/2023   CHOL 193 03/14/2023   TRIG 173.0 (H)  03/14/2023   HDL 59.60 03/14/2023   LDLDIRECT 163.4 12/31/2012   LDLCALC 99 03/14/2023   ALT 2 03/14/2023   AST 12 03/14/2023   NA 137 04/16/2023   K 4.3 04/16/2023   CL 105 04/16/2023   CREATININE 1.07 04/16/2023   BUN 21 04/16/2023   CO2 24 04/16/2023   TSH 1.80 04/16/2023   INR 1.0 12/12/2012   HGBA1C 5.8 03/14/2023    NM Bone Scan 3 Phase  Result Date: 09/21/2022 CLINICAL DATA:  LEFT knee arthroplasty EXAM: NUCLEAR MEDICINE 3-PHASE BONE SCAN TECHNIQUE: Radionuclide angiographic images, immediate static blood pool images, and 3-hour delayed static images were obtained of the knees after intravenous injection of radiopharmaceutical. RADIOPHARMACEUTICALS:  21.0 mCi Tc-61m MDP IV COMPARISON:  None Available. FINDINGS: Vascular phase: No asymmetric or increased blood flow to the LEFT or RIGHT knee. Blood pool phase: Mild asymmetric blood pool activity surrounding the LEFT knee joint. Delayed phase: Focal radiotracer activity accumulates within the LEFT patella. No abnormal radiotracer accumulation within the LEFT tibial plateau or femoral condyles. Photopenia noted associated with the LEFT knee prosthetic. Mild degenerative uptake in the medial LEFT knee compartment. IMPRESSION: 1. No evidence of loosening infection of the LEFT knee prosthetic. 2. Focal delayed uptake in the LEFT patella. Electronically Signed   By: Genevive Bi M.D.   On: 09/21/2022 10:57       Assessment & Plan:  Other fatigue -     Basic metabolic panel -     TSH  Swelling of lower extremity  Vitamin D deficiency -     VITAMIN D 25 Hydroxy (Vit-D Deficiency, Fractures)  Anemia, unspecified type -     CBC with Differential/Platelet -     Vitamin B12  Aortic atherosclerosis (HCC) Assessment & Plan: Crestor.    Anxiety, mild Assessment & Plan: Continue celexa at current dose. Try to limit xanax. Doing better.  Follow.    Gastroesophageal reflux disease without esophagitis Assessment & Plan: Upper  symptoms appear to be controlled on prilosec.  Follow.    Bilateral carotid artery disease, unspecified type (HCC) Assessment & Plan: Previously evaluated by [facility].  Needs to continue crestor.     Stage 3 chronic kidney disease, unspecified whether stage 3a or 3b CKD (HCC) Assessment & Plan: Avoid antiinflammatories.  Off micardis due to low blood pressure.  Reports blood pressure varying.  Blood pressure ok today.  Taking lasix prn. Follow pressures. Follow metabolic panel.    Depression, recurrent (HCC) Assessment & Plan: Continue citalopram.  Last visit, increased citalopram to 1 1/2 tablet per day.  Follow. Doing better.    Fibromuscular dysplasia (HCC) Assessment & Plan: Has been evaluated by [facility].  Continue risk factor modificaiton.    Hepatic steatosis Assessment & Plan: Diet and exercise.  Follow liver panel. Seen on chest CT angio - 09/11/21  Hypercholesterolemia Assessment & Plan: Crestor.   Low cholesterol diet and exercise. Follow lipid panel and liver function tests.    Hyperglycemia Assessment & Plan: Low carb diet and exercise.  Follow met b and a1c.    Mixed hyperlipidemia Assessment & Plan: On crestor.  Low cholesterol diet and exercise.  Follow lipid panel and liver function tests.     Primary hypertension Assessment & Plan: Varying pressures.  Hold on additional medication.  Off amlodipine. Lasix.   Follow metabolic panel.  Follow pressures.     Left knee pain, unspecified chronicity Assessment & Plan: Saw ortho recently as outlined.  Recommended PT and hinged knee brace.    Parkinson's disease, unspecified whether dyskinesia present, unspecified whether manifestations fluctuate [facility]) Assessment & Plan: Seeing Dr Arbutus Leas.  On sinemet - taking as outlined.  Has been doing better overall.  Follow.       [employer], MD

## 2023-04-17 LAB — CBC WITH DIFFERENTIAL/PLATELET
Basophils Absolute: 0.1 10*3/uL (ref 0.0–0.1)
Basophils Relative: 0.7 % (ref 0.0–3.0)
Eosinophils Absolute: 0.2 10*3/uL (ref 0.0–0.7)
Eosinophils Relative: 2 % (ref 0.0–5.0)
HCT: 36.7 % (ref 36.0–46.0)
Hemoglobin: 12 g/dL (ref 12.0–15.0)
Lymphocytes Relative: 22.4 % (ref 12.0–46.0)
Lymphs Abs: 2.2 10*3/uL (ref 0.7–4.0)
MCHC: 32.7 g/dL (ref 30.0–36.0)
MCV: 89.1 fL (ref 78.0–100.0)
Monocytes Absolute: 0.6 10*3/uL (ref 0.1–1.0)
Monocytes Relative: 6.2 % (ref 3.0–12.0)
Neutro Abs: 6.8 10*3/uL (ref 1.4–7.7)
Neutrophils Relative %: 68.7 % (ref 43.0–77.0)
Platelets: 250 10*3/uL (ref 150.0–400.0)
RBC: 4.12 Mil/uL (ref 3.87–5.11)
RDW: 14 % (ref 11.5–15.5)
WBC: 9.9 10*3/uL (ref 4.0–10.5)

## 2023-04-17 LAB — BASIC METABOLIC PANEL
BUN: 21 mg/dL (ref 6–23)
CO2: 24 meq/L (ref 19–32)
Calcium: 9 mg/dL (ref 8.4–10.5)
Chloride: 105 meq/L (ref 96–112)
Creatinine, Ser: 1.07 mg/dL (ref 0.40–1.20)
GFR: 51.57 mL/min — ABNORMAL LOW (ref >60.00)
Glucose, Bld: 107 mg/dL — ABNORMAL HIGH (ref 70–99)
Potassium: 4.3 meq/L (ref 3.5–5.1)
Sodium: 137 meq/L (ref 135–145)

## 2023-04-17 LAB — VITAMIN D 25 HYDROXY (VIT D DEFICIENCY, FRACTURES): VITD: 35.7 ng/mL (ref 30.00–100.00)

## 2023-04-17 LAB — TSH: TSH: 1.8 u[IU]/mL (ref 0.35–5.50)

## 2023-04-17 LAB — VITAMIN B12: Vitamin B-12: 1183 pg/mL — ABNORMAL HIGH (ref 211–911)

## 2023-04-18 ENCOUNTER — Telehealth: Payer: Self-pay

## 2023-04-18 ENCOUNTER — Other Ambulatory Visit: Payer: Self-pay | Admitting: Internal Medicine

## 2023-04-18 NOTE — Telephone Encounter (Signed)
Noted  

## 2023-04-18 NOTE — Telephone Encounter (Signed)
-----   Message from [place] sent at 04/18/2023  4:54 AM EST ----- Please call and notify - kidney function decreased some from recent checks, but overall relatively stable. Hgb improved and now wnl.  Thyroid test and vitamin D - wnl.

## 2023-04-18 NOTE — Telephone Encounter (Signed)
Patient states she is returning our call.  I read Dr. Randell Patient Scott's message to patient.

## 2023-04-20 ENCOUNTER — Encounter: Payer: Self-pay | Admitting: Internal Medicine

## 2023-04-20 NOTE — Assessment & Plan Note (Signed)
Crestor. Low cholesterol diet and exercise.  Follow lipid panel and liver function tests.  

## 2023-04-20 NOTE — Assessment & Plan Note (Signed)
Seeing Dr Tat.  On sinemet - taking as outlined.  Has been doing better overall.  Follow.

## 2023-04-20 NOTE — Assessment & Plan Note (Signed)
Upper symptoms appear to be controlled on prilosec.  Follow.  

## 2023-04-20 NOTE — Assessment & Plan Note (Signed)
Previously evaluated by [facility].  Needs to continue crestor.   

## 2023-04-20 NOTE — Assessment & Plan Note (Signed)
On crestor.  Low cholesterol diet and exercise.  Follow lipid panel and liver function tests.

## 2023-04-20 NOTE — Assessment & Plan Note (Signed)
Continue citalopram.  Last visit, increased citalopram to 1 1/2 tablet per day.  Follow. Doing better.

## 2023-04-20 NOTE — Assessment & Plan Note (Signed)
Varying pressures.  Hold on additional medication.  Off amlodipine. Lasix.   Follow metabolic panel.  Follow pressures.   

## 2023-04-20 NOTE — Assessment & Plan Note (Signed)
-   Crestor 

## 2023-04-20 NOTE — Assessment & Plan Note (Signed)
Diet and exercise.  Follow liver panel. Seen on chest CT angio - 09/11/21 

## 2023-04-20 NOTE — Assessment & Plan Note (Signed)
Low carb diet and exercise.  Follow met b and a1c.   

## 2023-04-20 NOTE — Assessment & Plan Note (Signed)
Saw ortho recently as outlined.  Recommended PT and hinged knee brace.

## 2023-04-20 NOTE — Assessment & Plan Note (Signed)
Has been evaluated by [facility].  Continue risk factor modificaiton.  

## 2023-04-20 NOTE — Assessment & Plan Note (Signed)
Continue celexa at current dose. Try to limit xanax. Doing better.  Follow.

## 2023-04-20 NOTE — Assessment & Plan Note (Signed)
Avoid antiinflammatories.  Off micardis due to low blood pressure.  Reports blood pressure varying.  Blood pressure ok today.  Taking lasix prn. Follow pressures. Follow metabolic panel.

## 2023-04-22 ENCOUNTER — Other Ambulatory Visit: Payer: Self-pay | Admitting: Internal Medicine

## 2023-04-22 ENCOUNTER — Telehealth: Payer: Self-pay

## 2023-04-22 ENCOUNTER — Encounter: Payer: Self-pay | Admitting: Neurology

## 2023-04-22 DIAGNOSIS — M25562 Pain in left knee: Secondary | ICD-10-CM | POA: Diagnosis not present

## 2023-04-22 DIAGNOSIS — M25561 Pain in right knee: Secondary | ICD-10-CM | POA: Diagnosis not present

## 2023-04-22 DIAGNOSIS — G8929 Other chronic pain: Secondary | ICD-10-CM | POA: Diagnosis not present

## 2023-04-22 NOTE — Telephone Encounter (Signed)
Pt stated that she is in slow motion her speech is slow her her movement is slow, she feels like both her legs are heavy this  has been a gradual worsening asking if she needs to be seen or increase in meds, no stroke symptoms

## 2023-04-24 ENCOUNTER — Other Ambulatory Visit: Payer: Self-pay | Admitting: Internal Medicine

## 2023-04-25 IMAGING — US US EXTREM LOW VENOUS*L*
1 series · 13 of 24 positions shown · non-contrast
Comparison: None.

CLINICAL DATA: Left lower extremity edema and pain, 1 week status
post knee replacement



[Series 1: us venous img lower uni left (dvt) · portal-venous · 13 of 27 slices shown]
[im 1/27]
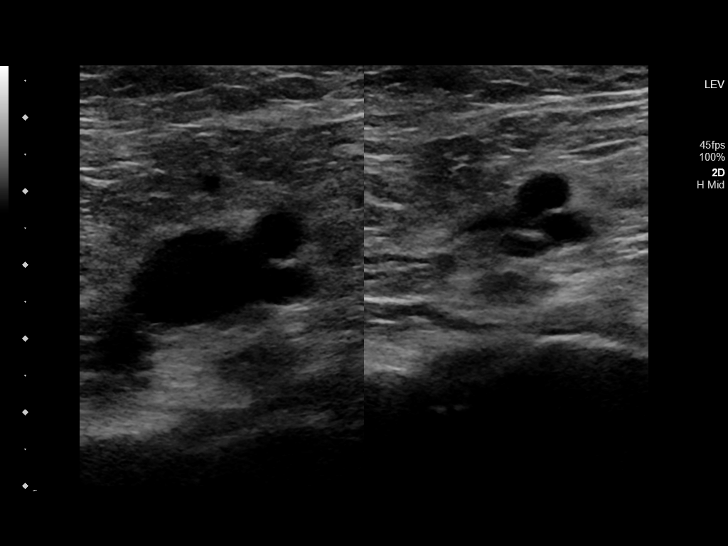
[im 3/27]
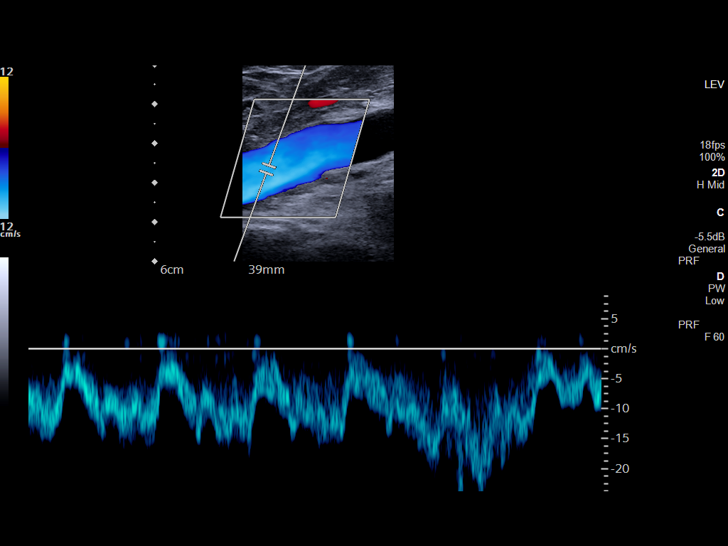
[im 5/27]
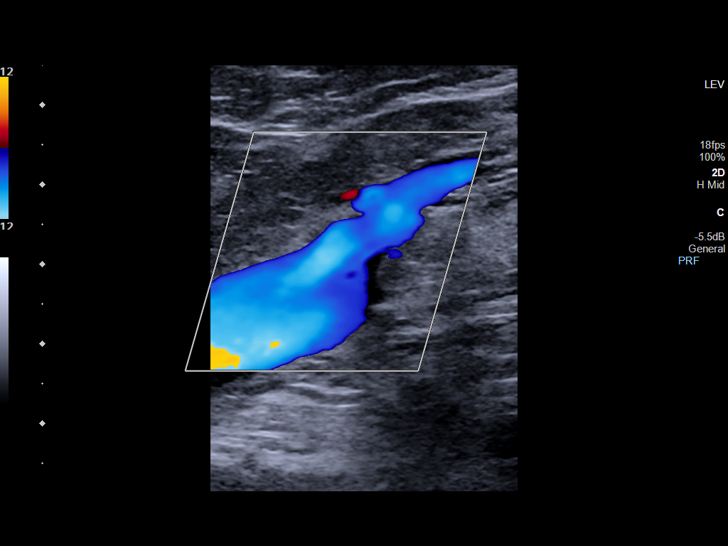
[im 7/27]
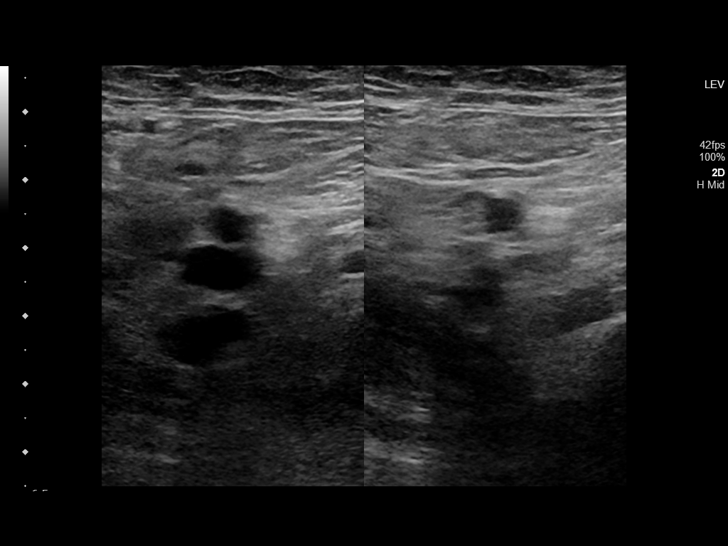
[im 10/27]
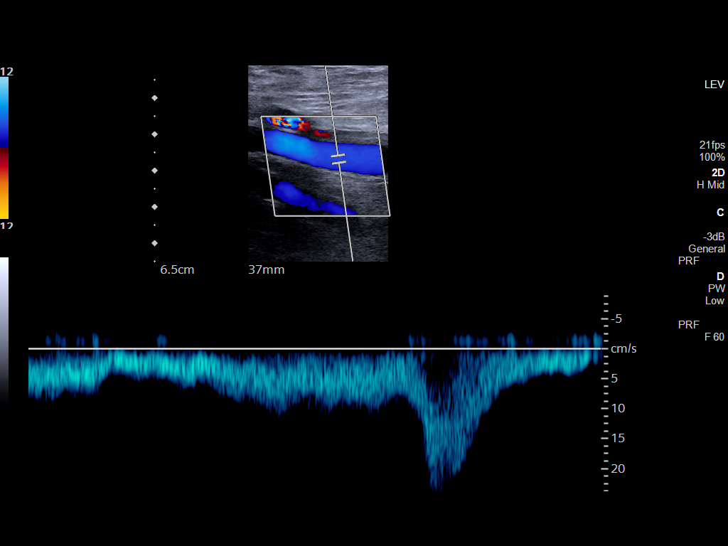
[im 12/27]
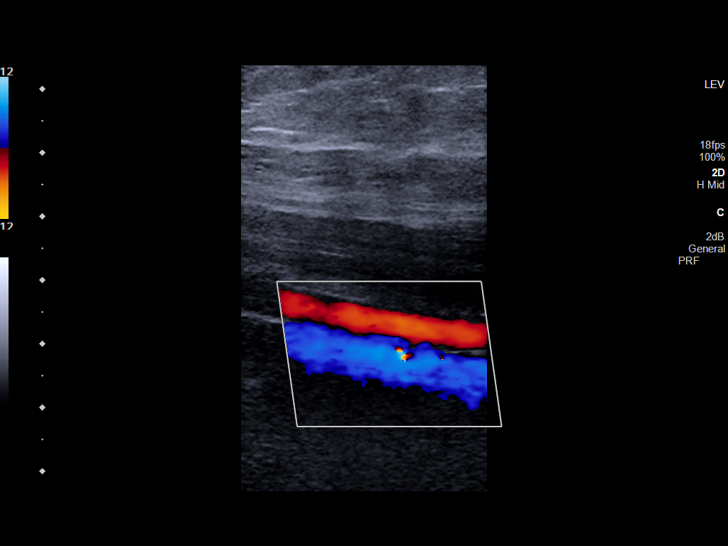
[im 14/27]
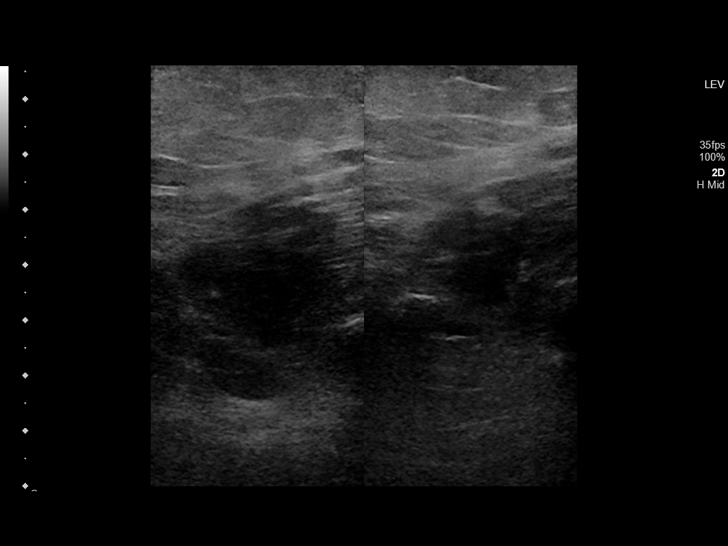
[im 15/27]
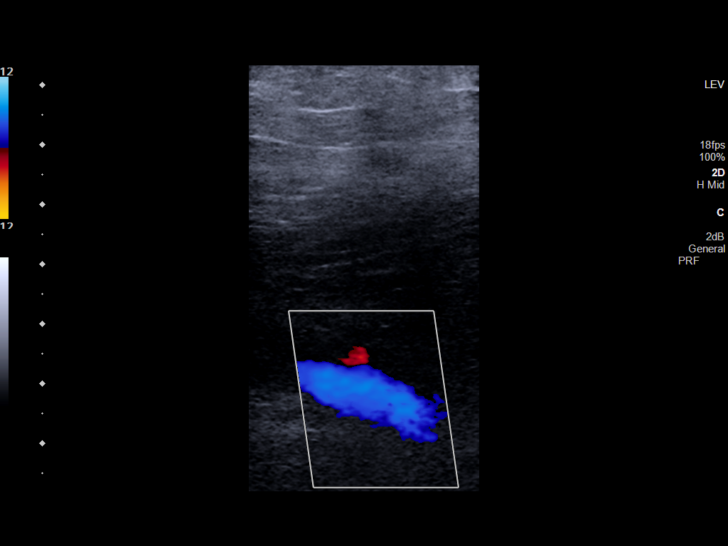
[im 17/27]
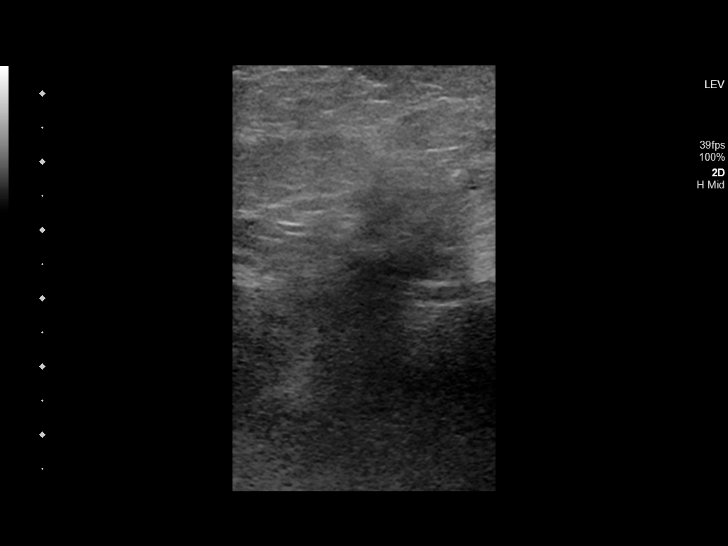
[im 20/27]
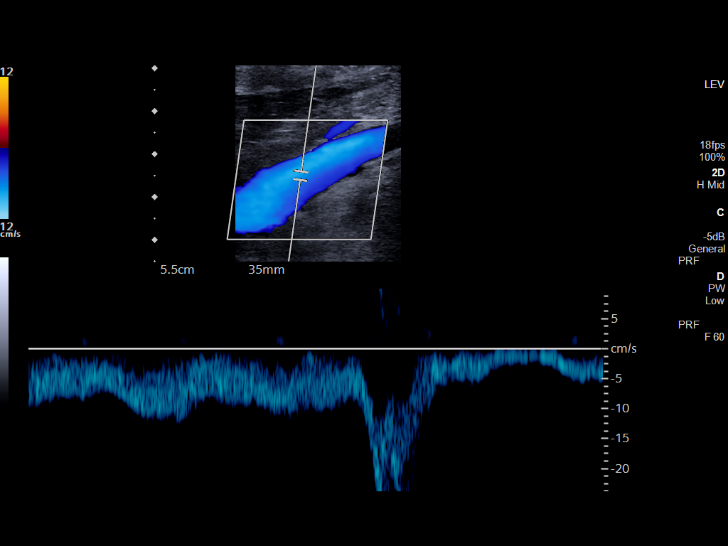
[im 22/27]
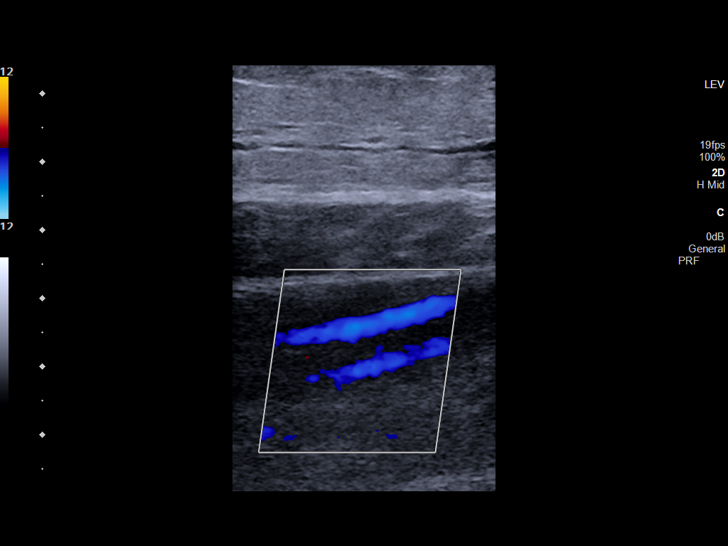
[im 24/27]
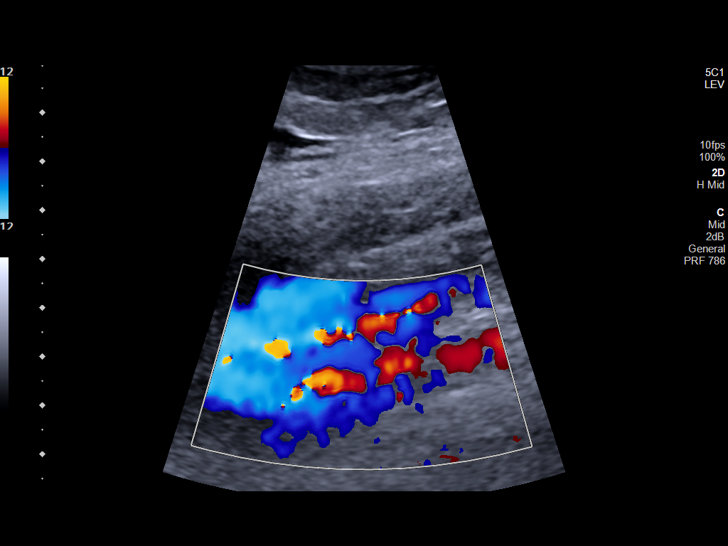
[im 27/27]
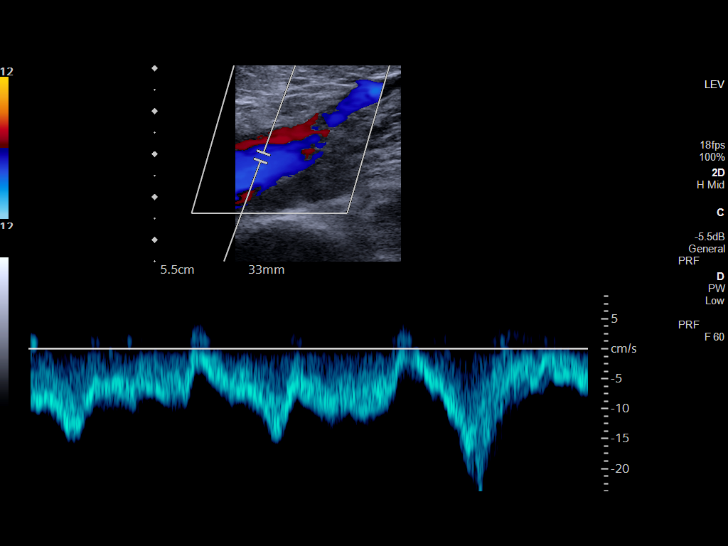

[13 of 24 positions shown; findings below may reference images not displayed]

FINDINGS: Contralateral Common Femoral Vein: Respiratory phasicity is normal
and symmetric with the symptomatic side. No evidence of thrombus.
Normal compressibility.

Common Femoral Vein: No evidence of thrombus. Normal
compressibility, respiratory phasicity and response to augmentation.

Saphenofemoral Junction: No evidence of thrombus. Normal
compressibility and flow on color Doppler imaging.

Profunda Femoral Vein: No evidence of thrombus. Normal
compressibility and flow on color Doppler imaging.

Femoral Vein: No evidence of thrombus. Normal compressibility,
respiratory phasicity and response to augmentation.

Popliteal Vein: No evidence of thrombus. Normal compressibility,
respiratory phasicity and response to augmentation.

Calf Veins: No evidence of thrombus. Normal compressibility and flow
on color Doppler imaging.
IMPRESSION: No evidence of deep venous thrombosis.

## 2023-04-29 DIAGNOSIS — G8929 Other chronic pain: Secondary | ICD-10-CM | POA: Diagnosis not present

## 2023-04-29 DIAGNOSIS — M25561 Pain in right knee: Secondary | ICD-10-CM | POA: Diagnosis not present

## 2023-04-29 DIAGNOSIS — M25562 Pain in left knee: Secondary | ICD-10-CM | POA: Diagnosis not present

## 2023-05-01 NOTE — Progress Notes (Signed)
Assessment/Plan:    1.  Parkinson's disease,              -The biggest issue previously has been her intolerance to medication.  Pt is taking carbidopa/levodopa 25/100 CR, 1.5 tabsl at 7am/11am/3pm/7pm.   She feels today she is doing well with this and does better than when she took 2/1/2/1.  This is the same overall dose.   2.  Sialorrhea             -This is commonly associated with PD.  We talked about treatments.  The patient is not a candidate for oral anticholinergic therapy because of increased risk of confusion and falls.  We discussed Botox (type A and B) and 1% atropine drops.  We discusssed that candy like lemon drops can help by stimulating mm of the oropharynx to induce swallowing.   3.  Constipation             -discussed nature and pathophysiology and association with PD             -discussed importance of hydration.  Pt is to increase water intake             -pt is given a copy of the rancho recipe             -recommended daily colace             -recommended miralax prn   4.  Hx of Neurogenic Orthostatic Hypotension             -hold amlodipine (per pcp notes she is supposed to be off of it)  -discussed hydration (she is not drinking enough) and is drinking way too much mountain dew  -has compression stockings but she says too hard to put on  -gave RX for abdominal compression binder to wear while up and awake  -Will need to continue to monitor her blood pressure.   5.  Depression  -Appreciate that primary care has been working to try to get the patient off of Xanax.  She uses this for shortness of breath, which is apparently all related to her anxiety.  Primary care has recently increased her citalopram.  6.  Head pain  -doesn't sound like TIA/stroke  -reports that it was associated with swelling of the L temple/lips - f/u pcp  -will check ESR but doubt TA.  Will make sure ESR not over 100  -will do CT brain to make sure nothing new, but does not sound  concerning for primary neurologic issue.  Subjective:   Victoria Hanson was seen today in follow up for Parkinsons disease.  My previous records were reviewed prior to todays visit as well as outside records available to me. Sister with patient and supplements hx. when I saw the patient last time, she had decreased her levodopa because she felt it was dropping her blood pressure, but she also was not taking the midodrine (prescribed elsewhere).  I told her last visit that I did not want her to hold her levodopa for low blood pressure, but instead to take the midodrine.  I told her she can hold her midodrine if her blood pressure was greater than 150.  In addition, she was also on amlodipine, which was contributing to low blood pressure and we told her to follow-up with primary care about that.  She did see her primary care physician on October 3.  Patients primary care wrote in that note that the patient  was not taking amlodipine.  She did call me in October stating that her blood pressure was low and dropping into the 90s systolic.  She reported she was still taking her amlodipine.  She was told to call the primary care physician about whether or not she still needed the amlodipine as her primary care had written that she was off of it, but also told that she had midodrine.  She reported that she did not have the midodrine (I believe it was previously possibly prescribed by [facility] neurology).  Nonetheless, we told her to first follow-up with primary care, because the first issue was that she was taking the amlodipine.  Separately, I did note a primary care notes that she is trying to work to get the patient down off of the Xanax and her citalopram was increased.  Pt states that about 2 weeks ago she had a swelling over the L temple and a pain.  "It was a bubble that you could actually feel."  Her sister states that her sister stated that her lips would also swell on the bottom.  It intermittently happens.   It lasted a few hours.  Her daughter wanted her to go to the hospital but she declined as her daughter thought she was having an aneurysm.  Pt states that sometimes "it will feel like it will start again."  Current movement disorder medications: Carbidopa/levodopa 25/100 CR 1.5 tablets at 7 AM/11 AM/4 PM/bedtime (written to take 2 tablets at 7 AM, 1 tablet at 11 AM, 1 tablet at 4 PM and 2 tablets at bedtime but she backed down on it)  PREVIOUS MEDICATIONS:  ropinirole (took 1 day and said made sleepy, but levodopa was also started the same day; retried several years later at 0.25 mg and they called a week later and stated caused low BP but already had dx of noh); pramipexole (unclear if took it; 1 note stated that she did not take it and another note stated that she had hallucinations with it - pt does recall hallucinations with "one medication"); midodrine (given but didn't take it) ; rotigotine, 2 mg daily  ALLERGIES:   Allergies  Allergen Reactions   Macrobid [Nitrofurantoin Macrocrystal] Shortness Of Breath and Palpitations   Antihistamines, Diphenhydramine-Type Other (See Comments)    She does not know which antihistamine she has an allergic reaction to.   Aspirin Other (See Comments)    Makes her sick on the stomach if she takes too many and causes bleeding.  Of note, she can take ibuprofen. Other reaction(s): Other (See Comments) Makes her sick on the stomach.  Of note, she can take ibuprofen. Makes her sick on the stomach.  Of note, she can take ibuprofen.   Ceftin [Cefuroxime Axetil] Other (See Comments)    Chest pains   Celecoxib Other (See Comments)    Reaction: Blood Disorder GI bleeding.   Cymbalta [Duloxetine Hcl] Other (See Comments)    Reaction: unknown   Diphenhydramine Other (See Comments)   Escitalopram Other (See Comments)   Lexapro [Escitalopram Oxalate] Other (See Comments)    Reaction: Difficulty with speech   Metronidazole Other (See Comments)    Other  reaction(s): Unknown   Nitrofurantoin Other (See Comments)   Zoloft [Sertraline Hcl]    Ciprofloxacin Other (See Comments)    Reaction: unknown    CURRENT MEDICATIONS:  Current Meds  Medication Sig   ALPRAZolam (XANAX) 0.5 MG tablet TAKE 1/2 TABLET BY MOUTH ONCE DAILY AS NEEDED   amLODipine (NORVASC)  5 MG tablet Take 5 mg by mouth daily.   aspirin EC 81 MG tablet Take 81 mg by mouth as needed.    Carbidopa-Levodopa ER (SINEMET CR) 25-100 MG tablet controlled release 2 at 7am/1 at 11am/1 at 4pm and 2 at BED   cholecalciferol (VITAMIN D3) 25 MCG (1000 UNIT) tablet Take 1,000 Units by mouth daily.   citalopram (CELEXA) 20 MG tablet TAKE 1 TABLET BY MOUTH DAILY   diclofenac Sodium (VOLTAREN ARTHRITIS PAIN) 1 % GEL Apply topically 4 (four) times daily.   fluticasone-salmeterol (ADVAIR HFA) 115-21 MCG/ACT inhaler Inhale 2 puffs into the lungs 2 (two) times daily.   furosemide (LASIX) 20 MG tablet Take 1 tablet (20 mg total) by mouth daily.   montelukast (SINGULAIR) 10 MG tablet TAKE 1 TABLET BY MOUTH AT BEDTIME   PROAIR HFA 108 (90 Base) MCG/ACT inhaler INHALE 2 PUFFS BY MOUTH INTO THE LUNGS EVERY 6 HOURS AS NEEDED FOR WHEEZING   rosuvastatin (CRESTOR) 5 MG tablet TAKE 1 TABLET BY MOUTH EVERY MONDAY, WEDNESDAY AND FRIDAY.   Spacer/Aero-Holding Chambers DEVI 1 Units by Does not apply route daily.   traZODone (DESYREL) 50 MG tablet TAKE 2 TABLETS BY MOUTH AT BEDTIME   vitamin B-12 (CYANOCOBALAMIN) 100 MCG tablet Take 100 mcg by mouth daily.   vitamin E 180 MG (400 UNITS) capsule Take 400 Units by mouth daily.     Objective:   PHYSICAL EXAMINATION:    VITALS:   Vitals:   05/03/23 1302  BP: 116/72  Pulse: 71  SpO2: 96%  Weight: 177 lb (80.3 kg)  Height: 5\' 3"  (1.6 m)    GEN:  The patient appears stated age and is in NAD. HEENT:  Normocephalic, atraumatic.  The mucous membranes are moist. The superficial temporal arteries are without ropiness or tenderness. Cardiovascular: Regular  rate rhythm Lungs: Clear to auscultation bilaterally  Neurological examination:  Orientation: The patient is alert and oriented x3. Cranial nerves: There is good facial symmetry with mild facial hypomimia. The speech is fluent and clear. Soft palate rises symmetrically and there is no tongue deviation. Hearing is intact to conversational tone. Sensation: Sensation is intact to light touch throughout Motor: Strength is at least antigravity x4.  Movement examination: Tone: There is normal tone in the upper and lower extremities today. Abnormal movements: mild dyskinesia in right arm and leg Coordination:  There is no decremation today. Gait and Station: The patient pushes off to arise.  She has no start hesitation today.  She is short stepped but not shuffling.  She is a bit wide-based today.  She is a bit wide-based.  She has hand dyskinesia with walking.  I have reviewed and interpreted the following labs independently    Chemistry      Component Value Date/Time   NA 137 04/16/2023 1511   NA 140 02/17/2021 1029   NA 134 (L) 09/01/2014 1359   K 4.3 04/16/2023 1511   K 4.1 09/01/2014 1359   CL 105 04/16/2023 1511   CL 103 09/01/2014 1359   CO2 24 04/16/2023 1511   CO2 27 09/01/2014 1359   BUN 21 04/16/2023 1511   BUN 18 02/17/2021 1029   BUN 23 (H) 09/01/2014 1359   CREATININE 1.07 04/16/2023 1511   CREATININE 1.16 (H) 01/14/2017 1217      Component Value Date/Time   CALCIUM 9.0 04/16/2023 1511   CALCIUM 8.8 (L) 09/01/2014 1359   ALKPHOS 71 03/14/2023 1526   ALKPHOS 85 09/01/2014 1359   AST  12 03/14/2023 1526   AST 16 09/01/2014 1359   ALT 2 03/14/2023 1526   ALT 9 (L) 09/01/2014 1359   BILITOT 0.3 03/14/2023 1526   BILITOT 0.4 09/01/2014 1359       Lab Results  Component Value Date   WBC 9.9 04/16/2023   HGB 12.0 04/16/2023   HCT 36.7 04/16/2023   MCV 89.1 04/16/2023   PLT 250.0 04/16/2023    Lab Results  Component Value Date   TSH 1.80 04/16/2023      Total time spent on today's visit was 50 minutes, including both face-to-face time and nonface-to-face time.  Time included that spent on review of records (prior notes available to me/labs/imaging if pertinent), discussing treatment and goals, answering patient's questions and coordinating care.  Cc:  Dale Danbury, MD

## 2023-05-03 ENCOUNTER — Ambulatory Visit: Payer: 59 | Admitting: Neurology

## 2023-05-03 ENCOUNTER — Other Ambulatory Visit: Payer: 59

## 2023-05-03 ENCOUNTER — Ambulatory Visit: Payer: 59

## 2023-05-03 ENCOUNTER — Encounter: Payer: Self-pay | Admitting: Neurology

## 2023-05-03 ENCOUNTER — Ambulatory Visit (INDEPENDENT_AMBULATORY_CARE_PROVIDER_SITE_OTHER): Payer: 59 | Admitting: Neurology

## 2023-05-03 VITALS — BP 116/72 | HR 71 | Ht 63.0 in | Wt 177.0 lb

## 2023-05-03 DIAGNOSIS — G903 Multi-system degeneration of the autonomic nervous system: Secondary | ICD-10-CM | POA: Diagnosis not present

## 2023-05-03 DIAGNOSIS — R519 Headache, unspecified: Secondary | ICD-10-CM

## 2023-05-03 DIAGNOSIS — G20B1 Parkinson's disease with dyskinesia, without mention of fluctuations: Secondary | ICD-10-CM

## 2023-05-03 DIAGNOSIS — G20A2 Parkinson's disease without dyskinesia, with fluctuations: Secondary | ICD-10-CM

## 2023-05-03 MED ORDER — CARBIDOPA-LEVODOPA ER 25-100 MG PO TBCR: EXTENDED_RELEASE_TABLET | ORAL | Status: DC

## 2023-05-03 MED ORDER — AMBULATORY NON FORMULARY MEDICATION: 0 refills | Status: AC

## 2023-05-03 NOTE — Patient Instructions (Signed)
HOLD amlodipine Use abdominal compression binder when up/moving around Drink at least 60 oz of water per day NO MORE than 6 oz of mountain dew per day MAX We will schedule your CT brain  Your provider has requested that you have labwork completed today. The lab is located on the Second floor at [address], within the [facility] office. When you get off the elevator, turn right and go in the [facility] [address]; the first brown door on the left.  Tell the ladies behind the desk that you are there for lab work. If you are not called within 15 minutes please check with the front desk.   Once you complete your labs you are free to go. You will receive a call or message via MyChart with your lab results.    The physicians and staff at [facility] are committed to providing excellent care. You may receive a survey requesting feedback about your experience at our office. We strive to receive "very good" responses to the survey questions. If you feel that your experience would prevent you from giving the office a "very good " response, please contact our office to try to remedy the situation. We may be reached at [phone]. Thank you for taking the time out of your busy day to complete the survey.

## 2023-05-04 LAB — SEDIMENTATION RATE: Sed Rate: 9 mm/h (ref 0–30)

## 2023-05-06 DIAGNOSIS — M25561 Pain in right knee: Secondary | ICD-10-CM | POA: Diagnosis not present

## 2023-05-06 DIAGNOSIS — M25562 Pain in left knee: Secondary | ICD-10-CM | POA: Diagnosis not present

## 2023-05-06 DIAGNOSIS — G8929 Other chronic pain: Secondary | ICD-10-CM | POA: Diagnosis not present

## 2023-05-07 ENCOUNTER — Other Ambulatory Visit: Payer: Self-pay | Admitting: Internal Medicine

## 2023-05-07 ENCOUNTER — Ambulatory Visit
Admission: RE | Admit: 2023-05-07 | Discharge: 2023-05-07 | Disposition: A | Discharge status: Home / Self Care | Payer: 59 | Source: Ambulatory Visit | Attending: Neurology | Admitting: Neurology

## 2023-05-07 DIAGNOSIS — R220 Localized swelling, mass and lump, head: Secondary | ICD-10-CM | POA: Diagnosis not present

## 2023-05-07 DIAGNOSIS — R519 Headache, unspecified: Secondary | ICD-10-CM | POA: Diagnosis not present

## 2023-05-07 IMAGING — US US EXTREM LOW VENOUS*L*
1 series · 13 of 24 positions shown · non-contrast
Comparison: 08/30/2021

CLINICAL DATA: Swelling, shortness of breath, recent knee
replacement surgery

EXAM:
LEFT LOWER EXTREMITY VENOUS DOPPLER ULTRASOUND
TECHNIQUE: Gray-scale sonography with compression, as well as color and duplex
ultrasound, were performed to evaluate the deep venous system(s)
from the level of the common femoral vein through the popliteal and
proximal calf veins.

[Series 1: us extrem low venous*left* · 0.09mm/px · 13 of 36 slices shown]
[im 1/36]
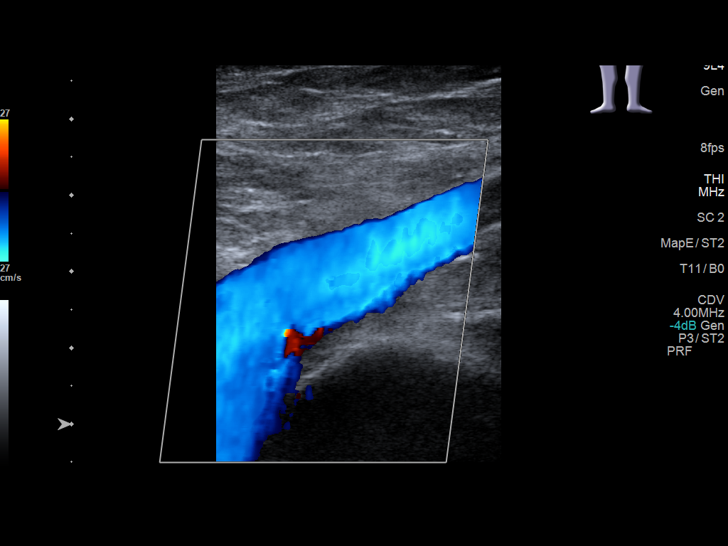
[im 4/36]
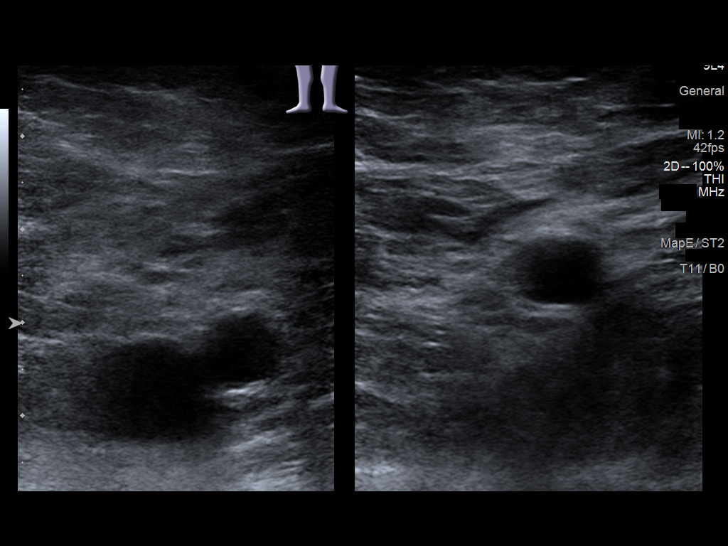
[im 7/36]
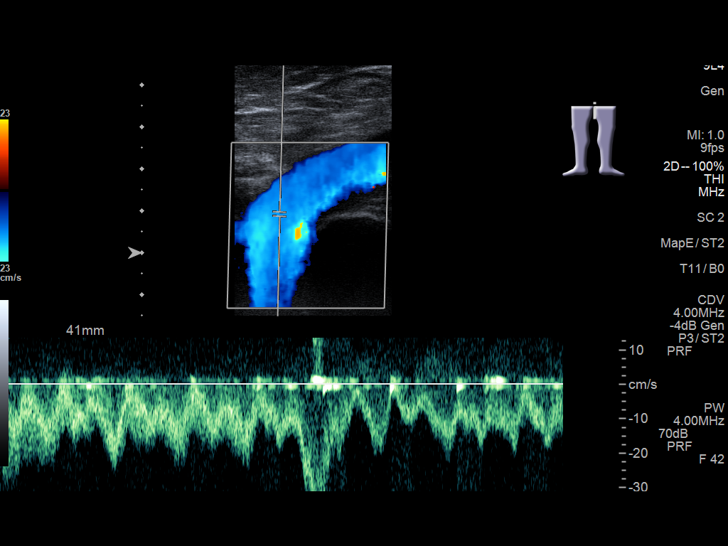
[im 10/36]
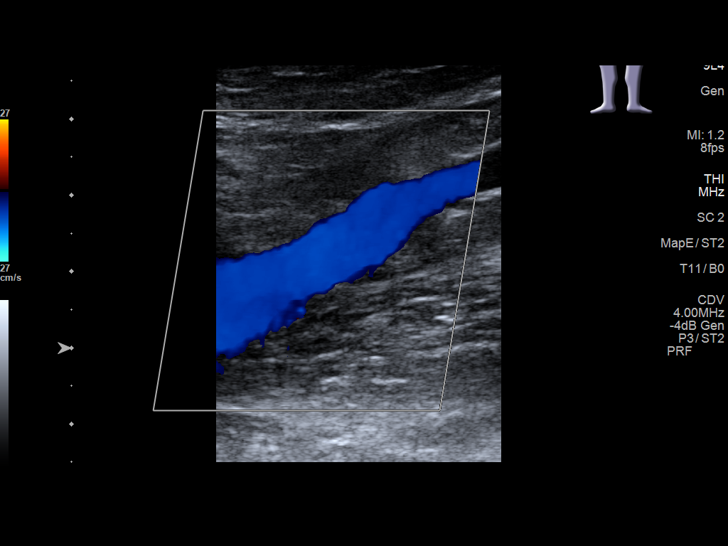
[im 13/36]
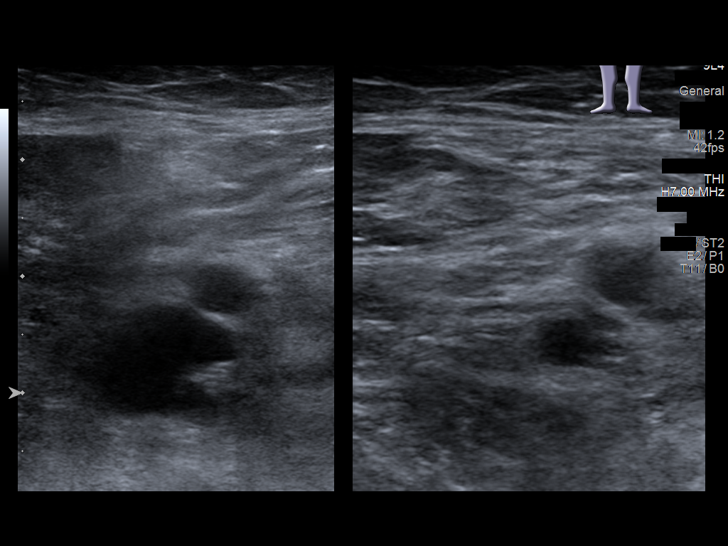
[im 16/36]
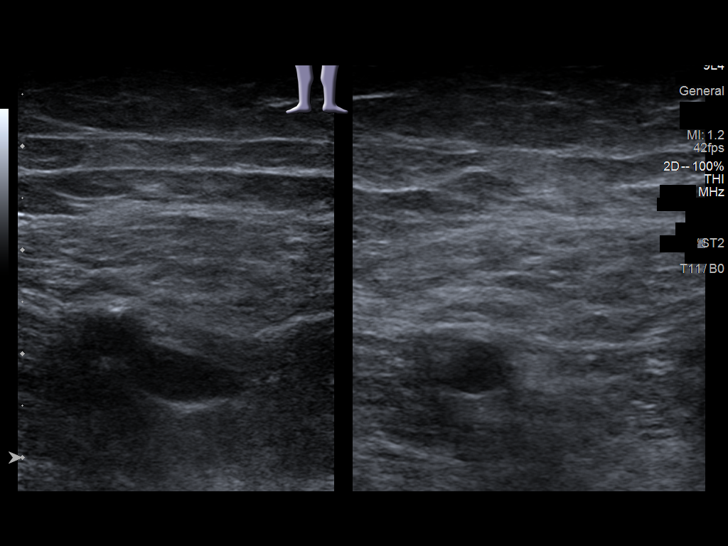
[im 19/36]
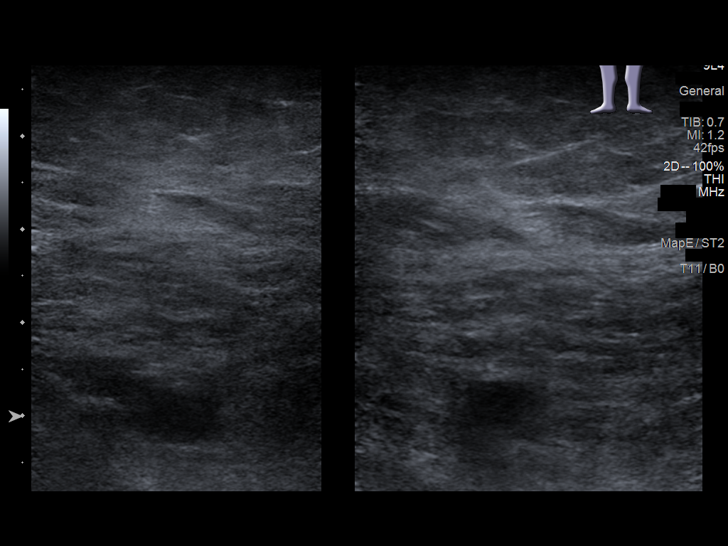
[im 20/36]
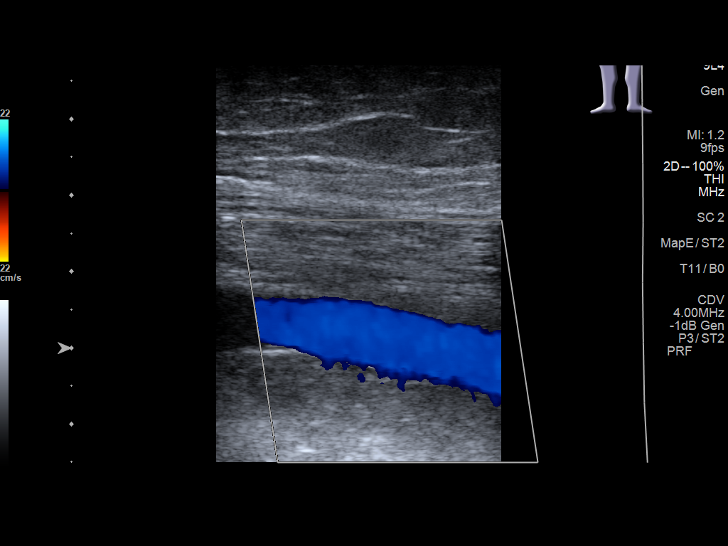
[im 23/36]
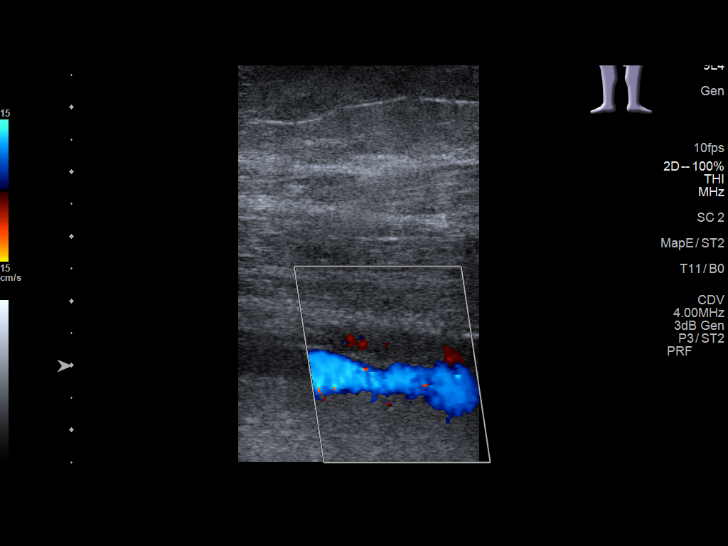
[im 26/36]
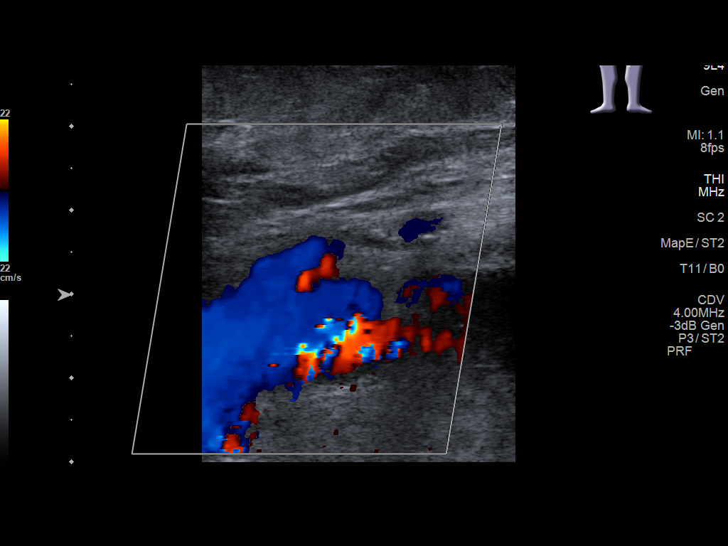
[im 29/36]
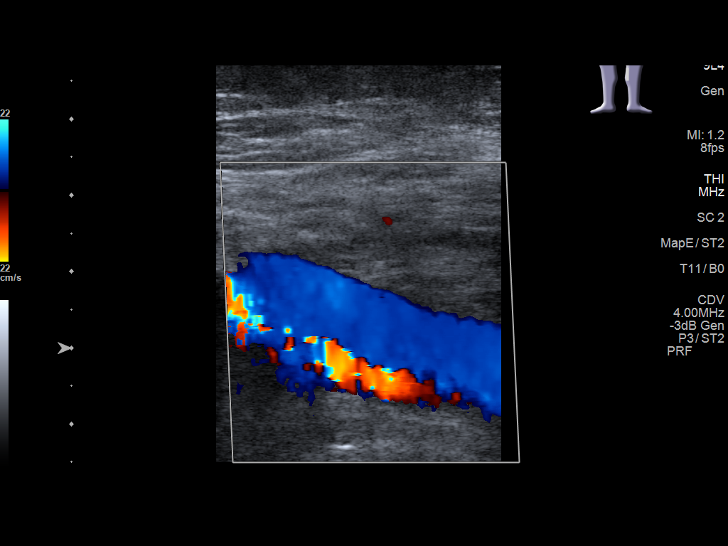
[im 32/36]
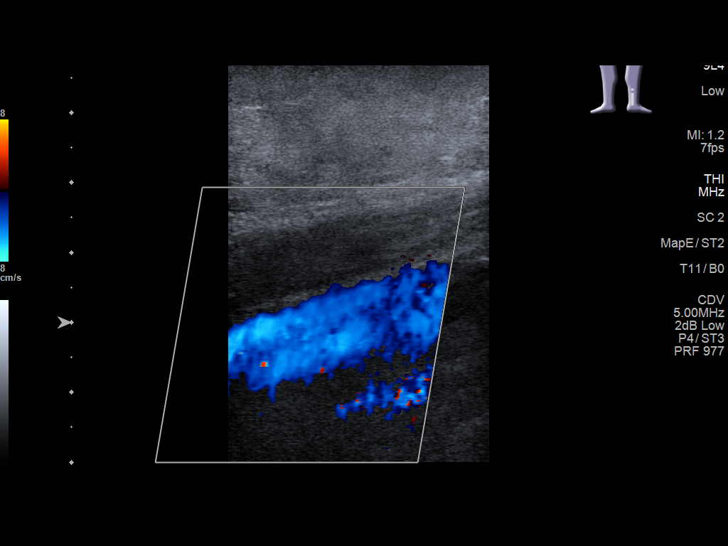
[im 36/36]
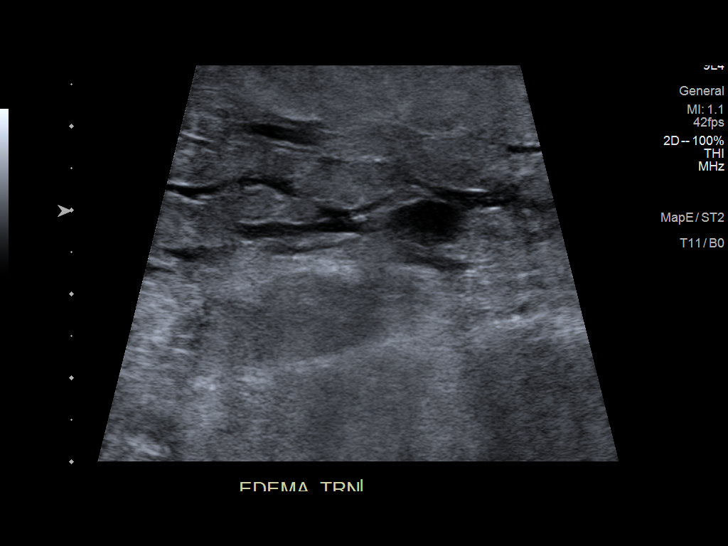

[13 of 24 positions shown; findings below may reference images not displayed]

FINDINGS: VENOUS

Normal compressibility of the common femoral, superficial femoral,
and popliteal veins, as well as the visualized calf veins.
Visualized portions of profunda femoral vein and great saphenous
vein unremarkable. No filling defects to suggest DVT on grayscale or
color Doppler imaging. Doppler waveforms show normal direction of
venous flow, normal respiratory plasticity and response to
augmentation.

Limited views of the contralateral common femoral vein are
unremarkable.

OTHER

Left calf subcutaneous edema.

Limitations: none
IMPRESSION: No femoropopliteal DVT nor evidence of DVT within the visualized
calf veins. If clinical symptoms are inconsistent or if there are
persistent or worsening symptoms, further imaging (possibly
involving the iliac veins) may be warranted.

## 2023-05-07 NOTE — Telephone Encounter (Signed)
Rx ok'd for alprazolam #30 with no refills.

## 2023-05-13 DIAGNOSIS — M25562 Pain in left knee: Secondary | ICD-10-CM | POA: Diagnosis not present

## 2023-05-13 DIAGNOSIS — M25561 Pain in right knee: Secondary | ICD-10-CM | POA: Diagnosis not present

## 2023-05-13 DIAGNOSIS — G8929 Other chronic pain: Secondary | ICD-10-CM | POA: Diagnosis not present

## 2023-05-15 ENCOUNTER — Ambulatory Visit: Payer: 59 | Admitting: Internal Medicine

## 2023-05-15 ENCOUNTER — Encounter: Payer: Self-pay | Admitting: Internal Medicine

## 2023-05-15 VITALS — BP 138/78 | HR 65 | Temp 97.6°F | Ht 63.0 in | Wt 179.4 lb

## 2023-05-15 DIAGNOSIS — M25569 Pain in unspecified knee: Secondary | ICD-10-CM | POA: Diagnosis not present

## 2023-05-15 DIAGNOSIS — M7989 Other specified soft tissue disorders: Secondary | ICD-10-CM

## 2023-05-15 DIAGNOSIS — F419 Anxiety disorder, unspecified: Secondary | ICD-10-CM | POA: Diagnosis not present

## 2023-05-15 DIAGNOSIS — Z85038 Personal history of other malignant neoplasm of large intestine: Secondary | ICD-10-CM

## 2023-05-15 DIAGNOSIS — R739 Hyperglycemia, unspecified: Secondary | ICD-10-CM | POA: Diagnosis not present

## 2023-05-15 DIAGNOSIS — G20A1 Parkinson's disease without dyskinesia, without mention of fluctuations: Secondary | ICD-10-CM

## 2023-05-15 DIAGNOSIS — I779 Disorder of arteries and arterioles, unspecified: Secondary | ICD-10-CM | POA: Diagnosis not present

## 2023-05-15 DIAGNOSIS — I773 Arterial fibromuscular dysplasia: Secondary | ICD-10-CM

## 2023-05-15 DIAGNOSIS — I700 Atherosclerosis of aorta: Secondary | ICD-10-CM

## 2023-05-15 DIAGNOSIS — F339 Major depressive disorder, recurrent, unspecified: Secondary | ICD-10-CM

## 2023-05-15 DIAGNOSIS — N183 Chronic kidney disease, stage 3 unspecified: Secondary | ICD-10-CM | POA: Diagnosis not present

## 2023-05-15 DIAGNOSIS — E7800 Pure hypercholesterolemia, unspecified: Secondary | ICD-10-CM | POA: Diagnosis not present

## 2023-05-15 DIAGNOSIS — Z8589 Personal history of malignant neoplasm of other organs and systems: Secondary | ICD-10-CM

## 2023-05-15 NOTE — Progress Notes (Signed)
Subjective:    Patient ID: Victoria Hanson, female    DOB: 1950-10-16, 72 y.o.   MRN: [id number]  Patient here for  Chief Complaint  Patient presents with   Medical Management of Chronic Issues    6 week f/up     HPI Here for a scheduled follow up. Here for follow up appt - f/u regarding her blood pressure and parkinsons. Seeing Dr Tat for f/u of parkinsons. Doing better overall. Evaluated by [facility] 04/03/23 - lower extremity swelling. Ultrasound - no DVT. Having persistent knee issues.  [facility] recommended f/u for knee issues first. Ortho evaluation 04/05/23 - recommended PT and hinged knee brace. Has been to PT. Planning to f/u with ortho this week.  Right knee is starting to bother her.  Saw Dr Tat 05/03/23 - noted head pain. ESR - normal.  CT head  - results pending. Continued on carbidopa.  Encouraged to wear compression hose. No chest pain.  Breathing stable. Bowels moving. She had questions about colonoscopy.  Desires not to have f/u colonoscopy at this time. Not taking crestor.  Desires not to take.    Past Medical History:  Diagnosis Date   Anxiety    Asthma    Atypical chest pain    a. 2003 Cath: reportedly nl per pt [name]);  b. 04/2015 [facility] Admission, neg troponin.   Colon cancer (HCC) 1999   a. 1999: Pt had partial colectomy. Did develop lung metastasis. Had right upper lobectomy in 01/2007 then had associated chemotherapy.   COPD (chronic obstructive pulmonary disease) (HCC)    DDD (degenerative disc disease), lumbosacral    Depression    Echocardiogram abnormal    a. 02/2010 Echo: EF 55-60%, mild LAE, no regional wall motion abnormalities   Elevated transaminase level    a. ? NASH;  b. 06/2015 - nl LFTs.   GERD (gastroesophageal reflux disease)    Hematuria    Holter monitor, abnormal    a. 12/2009 Holter monitoring: NSR, rare PACs and PVCs.   Hypercholesterolemia    Hyperlipidemia    Hypertension    Leaky heart valve    a. Per pt report - no evidence of valvular  abnormalities on prior echoes.   Lesion of skin of Right breast 10/20/2017   Lung metastases 2008   a. S/P resection (lobectomy - right)   Morbid obesity (HCC)    Nephrolithiasis    pt denies   Parkinson disease (HCC) 08/2016   [name]   Past Surgical History:  Procedure Laterality Date   BREAST EXCISIONAL BIOPSY Right 10/07/2009   benign   CARDIAC CATHETERIZATION  2003   negative as per pt report   CHOLECYSTECTOMY  2000   COLON SURGERY     Colon cancer, removed part of large colon   COLONOSCOPY N/A 04/25/2015   Procedure: COLONOSCOPY;  Surgeon: Scot Jun, MD;  Location: [facility] ENDOSCOPY;  Service: Endoscopy;  Laterality: N/A;   COLONOSCOPY WITH PROPOFOL N/A 06/14/2017   Procedure: COLONOSCOPY WITH PROPOFOL;  Surgeon: Scot Jun, MD;  Location[facility] ENDOSCOPY;  Service: Endoscopy;  Laterality: N/A;   COLONOSCOPY WITH PROPOFOL N/A 11/17/2019   Procedure: COLONOSCOPY WITH PROPOFOL;  Surgeon: Wyline Mood, MD;  Location: [facility] ENDOSCOPY;  Service: Gastroenterology;  Laterality: N/A;   COLONOSCOPY WITH PROPOFOL N/A 03/21/2021   Procedure: COLONOSCOPY WITH PROPOFOL;  Surgeon: Regis Bill, MD;  Location: [facility] ENDOSCOPY;  Service: Endoscopy;  Laterality: N/A;   ESOPHAGOGASTRODUODENOSCOPY (EGD) WITH PROPOFOL N/A 03/21/2021   Procedure: ESOPHAGOGASTRODUODENOSCOPY (EGD)  WITH PROPOFOL;  Surgeon: Regis Bill, MD;  Location[facility] ENDOSCOPY;  Service: Endoscopy;  Laterality: N/A;   LOBECTOMY  01/2007   Right lung, upper lobe right side removed   REPLACEMENT TOTAL KNEE Left 08/2020   TOTAL ABDOMINAL HYSTERECTOMY     abnormal bleeding   TUBAL LIGATION  1980   Family History  Problem Relation Age of Onset   Coronary artery disease Mother        died @ 57 of MI.   Mental illness Mother    Diabetes Mother    Heart attack Father 30       MI   Cancer Father        lung - died in his mid-70's   Hypertension Father    Diabetes Father    Sudden death Brother     Mental illness Brother    Heart attack Brother        MI @ 29, died @ age 82.   Hypertension Child    Breast cancer Neg Hx    Social History   Socioeconomic History   Marital status: Widowed    Spouse name: Not on file   Number of children: Not on file   Years of education: Not on file   Highest education level: Not on file  Occupational History   Not on file  Tobacco Use   Smoking status: Never    Passive exposure: Yes   Smokeless tobacco: Never   Tobacco comments:    husband and son smoked in her home.  Vaping Use   Vaping status: Never Used  Substance and Sexual Activity   Alcohol use: Not Currently    Alcohol/week: 0.0 standard drinks of alcohol   Drug use: No   Sexual activity: Never  Other Topics Concern   Not on file  Social History Narrative   Widowed.   Disabled (colon/lung CA), retired.   Lives in [place] with her dtr, grand-dtr, and son.   Active.   Gets regular exercise, walks.   Right handed    Social Determinants of Health   Financial Resource Strain: Low Risk  (01/07/2023)   Overall Financial Resource Strain (CARDIA)    Difficulty of Paying Living Expenses: Not hard at all  Food Insecurity: No Food Insecurity (01/07/2023)   Hunger Vital Sign    Worried About Running Out of Food in the Last Year: Never true    Ran Out of Food in the Last Year: Never true  Transportation Needs: No Transportation Needs (01/07/2023)   PRAPARE - Administrator, Civil Service (Medical): No    Lack of Transportation (Non-Medical): No  Physical Activity: Inactive (01/07/2023)   Exercise Vital Sign    Days of Exercise per Week: 0 days    Minutes of Exercise per Session: 0 min  Stress: Stress Concern Present (01/07/2023)   [employer] of Occupational Health - Occupational Stress Questionnaire    Feeling of Stress : Rather much  Social Connections: Moderately Integrated (01/07/2023)   Social Connection and Isolation Panel [NHANES]    Frequency of  Communication with Friends and Family: More than three times a week    Frequency of Social Gatherings with Friends and Family: More than three times a week    Attends Religious Services: More than 4 times per year    Active Member of [employer] or Organizations: Yes    Attends Banker Meetings: More than 4 times per year    Marital Status: Widowed  Review of Systems  Constitutional:  Negative for appetite change and unexpected weight change.  HENT:  Negative for congestion and sinus pressure.   Respiratory:  Negative for cough, chest tightness and shortness of breath.   Cardiovascular:  Negative for chest pain and palpitations.       Lower extremity swelling - stable.    Gastrointestinal:  Negative for abdominal pain, diarrhea, nausea and vomiting.  Genitourinary:  Negative for difficulty urinating and dysuria.  Musculoskeletal:  Negative for myalgias.       Knee pain as outlined.   Skin:  Negative for color change and rash.  Neurological:  Negative for dizziness and headaches.  Psychiatric/Behavioral:  Negative for agitation and dysphoric mood.        Objective:     BP 138/78   Pulse 65   Temp 97.6 F (36.4 C) (Oral)   Ht 5\' 3"  (1.6 m)   Wt 179 lb 6.4 oz (81.4 kg)   SpO2 98%   BMI 31.78 kg/m  Wt Readings from Last 3 Encounters:  05/15/23 179 lb 6.4 oz (81.4 kg)  05/03/23 177 lb (80.3 kg)  04/16/23 173 lb 6.4 oz (78.7 kg)    Physical Exam Vitals reviewed.  Constitutional:      General: She is not in acute distress.    Appearance: Normal appearance.  HENT:     Head: Normocephalic and atraumatic.     Right Ear: External ear normal.     Left Ear: External ear normal.  Eyes:     General: No scleral icterus.       Right eye: No discharge.        Left eye: No discharge.     Conjunctiva/sclera: Conjunctivae normal.  Neck:     Thyroid: No thyromegaly.  Cardiovascular:     Rate and Rhythm: Normal rate and regular rhythm.  Pulmonary:     Effort: No  respiratory distress.     Breath sounds: Normal breath sounds. No wheezing.  Abdominal:     General: Bowel sounds are normal.     Palpations: Abdomen is soft.     Tenderness: There is no abdominal tenderness.  Musculoskeletal:        General: No swelling or tenderness.     Cervical back: Neck supple. No tenderness.  Lymphadenopathy:     Cervical: No cervical adenopathy.  Skin:    Findings: No erythema or rash.  Neurological:     Mental Status: She is alert.  Psychiatric:        Mood and Affect: Mood normal.        Behavior: Behavior normal.      Outpatient Encounter Medications as of 05/15/2023  Medication Sig   ALPRAZolam (XANAX) 0.5 MG tablet TAKE 1/2 TABLET BY MOUTH ONCE DAILY AS NEEDED   AMBULATORY NON FORMULARY MEDICATION Abdominal compression binder   aspirin EC 81 MG tablet Take 81 mg by mouth as needed.    Carbidopa-Levodopa ER (SINEMET CR) 25-100 MG tablet controlled release 1.5 tablets qid   cholecalciferol (VITAMIN D3) 25 MCG (1000 UNIT) tablet Take 1,000 Units by mouth daily.   citalopram (CELEXA) 20 MG tablet TAKE 1 TABLET BY MOUTH DAILY   diclofenac Sodium (VOLTAREN ARTHRITIS PAIN) 1 % GEL Apply topically 4 (four) times daily.   fluticasone-salmeterol (ADVAIR HFA) 115-21 MCG/ACT inhaler Inhale 2 puffs into the lungs 2 (two) times daily.   furosemide (LASIX) 20 MG tablet Take 1 tablet (20 mg total) by mouth daily.   montelukast (SINGULAIR) 10  MG tablet TAKE 1 TABLET BY MOUTH AT BEDTIME   PROAIR HFA 108 (90 Base) MCG/ACT inhaler INHALE 2 PUFFS BY MOUTH INTO THE LUNGS EVERY 6 HOURS AS NEEDED FOR WHEEZING   Spacer/Aero-Holding Chambers DEVI 1 Units by Does not apply route daily.   traZODone (DESYREL) 50 MG tablet TAKE 2 TABLETS BY MOUTH AT BEDTIME   vitamin B-12 (CYANOCOBALAMIN) 100 MCG tablet Take 100 mcg by mouth daily.   vitamin E 180 MG (400 UNITS) capsule Take 400 Units by mouth daily.   [DISCONTINUED] amLODipine (NORVASC) 5 MG tablet Take 5 mg by mouth daily.  (Patient not taking: Reported on 05/15/2023)   [DISCONTINUED] rosuvastatin (CRESTOR) 5 MG tablet TAKE 1 TABLET BY MOUTH EVERY MONDAY, WEDNESDAY AND FRIDAY. (Patient not taking: Reported on 05/15/2023)   No facility-administered encounter medications on file as of 05/15/2023.     Lab Results  Component Value Date   WBC 9.9 04/16/2023   HGB 12.0 04/16/2023   HCT 36.7 04/16/2023   PLT 250.0 04/16/2023   GLUCOSE 107 (H) 04/16/2023   CHOL 193 03/14/2023   TRIG 173.0 (H) 03/14/2023   HDL 59.60 03/14/2023   LDLDIRECT 163.4 12/31/2012   LDLCALC 99 03/14/2023   ALT 2 03/14/2023   AST 12 03/14/2023   NA 137 04/16/2023   K 4.3 04/16/2023   CL 105 04/16/2023   CREATININE 1.07 04/16/2023   BUN 21 04/16/2023   CO2 24 04/16/2023   TSH 1.80 04/16/2023   INR 1.0 12/12/2012   HGBA1C 5.8 03/14/2023       Assessment & Plan:  Anxiety, mild Assessment & Plan: Continue celexa at current dose. Try to limit xanax. Doing better.  Follow.    Aortic atherosclerosis (HCC) Assessment & Plan: Off crestor.  Desires not to take.    Bilateral carotid artery disease, unspecified type (HCC) Assessment & Plan: Previously evaluated by [facility]. Off crestor.  Desires not to take.     Stage 3 chronic kidney disease, unspecified whether stage 3a or 3b CKD (HCC) Assessment & Plan: Avoid antiinflammatories.  Off micardis due to low blood pressure.  Reports blood pressure varying.  Blood pressure on recheck improved. Taking lasix prn. Follow pressures. Follow metabolic panel.    Depression, recurrent (HCC) Assessment & Plan: Continue citalopram.  Follow. Doing better.    Fibromuscular dysplasia (HCC) Assessment & Plan: Has been evaluated by [facility].  Continue risk factor modificaiton.    History of colon cancer Assessment & Plan:  Saw GI 12/20/22 - f/u colon cancer and anemia.  Desires no f/u colonoscopy at this time.    History of malignant neoplasm metastatic to lung Assessment & Plan: S/p  lobectomy.  Followed by pulmonary.  Albuterol inhaler if needed. Breathing stable.    Hypercholesterolemia Assessment & Plan: Off Crestor.   Desires not to take. Low cholesterol diet and exercise. Follow lipid panel and liver function tests.    Hyperglycemia Assessment & Plan: Low carb diet and exercise.  Follow met b and a1c. Recent check 5.9.    Knee pain, unspecified chronicity, unspecified laterality Assessment & Plan: Ortho evaluation 04/05/23 - recommended PT and hinged knee brace. Has been to PT. Planning to f/u with ortho this week.  Right knee is starting to bother her.    Parkinson's disease, unspecified whether dyskinesia present, unspecified whether manifestations fluctuate [facility]) Assessment & Plan: Seeing Dr Arbutus Leas.  On sinemet - taking as outlined.  Has been doing better overall.  Follow.    Swelling of lower extremity Assessment &  Plan: Saw [facility].  Recommended compression hose.  Continue leg elevation.        Dale [place], MD

## 2023-05-15 NOTE — Assessment & Plan Note (Signed)
Low carb diet and exercise.  Follow met b and a1c. Recent check 5.9.

## 2023-05-15 NOTE — Assessment & Plan Note (Signed)
Off Crestor.   Desires not to take. Low cholesterol diet and exercise. Follow lipid panel and liver function tests.

## 2023-05-15 NOTE — Assessment & Plan Note (Signed)
S/p lobectomy.  Followed by pulmonary.  Albuterol inhaler if needed. Breathing stable.

## 2023-05-15 NOTE — Assessment & Plan Note (Signed)
Previously evaluated by [facility]. Off crestor.  Desires not to take.

## 2023-05-15 NOTE — Assessment & Plan Note (Signed)
Has been evaluated by [facility].  Continue risk factor modificaiton.  

## 2023-05-15 NOTE — Assessment & Plan Note (Signed)
 Continue celexa at current dose. Try to limit xanax. Doing better.  Follow.

## 2023-05-15 NOTE — Assessment & Plan Note (Signed)
Off crestor.  Desires not to take.

## 2023-05-15 NOTE — Assessment & Plan Note (Signed)
Seeing Dr Tat.  On sinemet - taking as outlined.  Has been doing better overall.  Follow.

## 2023-05-15 NOTE — Assessment & Plan Note (Signed)
Ortho evaluation 04/05/23 - recommended PT and hinged knee brace. Has been to PT. Planning to f/u with ortho this week.  Right knee is starting to bother her.

## 2023-05-15 NOTE — Assessment & Plan Note (Signed)
Saw [facility].  Recommended compression hose.  Continue leg elevation.

## 2023-05-15 NOTE — Assessment & Plan Note (Signed)
Continue citalopram.   Follow. Doing better.  

## 2023-05-15 NOTE — Assessment & Plan Note (Signed)
Avoid antiinflammatories.  Off micardis due to low blood pressure.  Reports blood pressure varying.  Blood pressure on recheck improved. Taking lasix prn. Follow pressures. Follow metabolic panel.

## 2023-05-15 NOTE — Assessment & Plan Note (Signed)
Saw GI 12/20/22 - f/u colon cancer and anemia.  Desires no f/u colonoscopy at this time.

## 2023-05-17 DIAGNOSIS — R7309 Other abnormal glucose: Secondary | ICD-10-CM | POA: Diagnosis not present

## 2023-05-17 DIAGNOSIS — M1711 Unilateral primary osteoarthritis, right knee: Secondary | ICD-10-CM | POA: Diagnosis not present

## 2023-05-20 ENCOUNTER — Telehealth: Payer: Self-pay | Admitting: Neurology

## 2023-05-20 NOTE — Telephone Encounter (Signed)
Called patient and gave results.

## 2023-05-20 NOTE — Telephone Encounter (Signed)
Both were good

## 2023-05-20 NOTE — Telephone Encounter (Signed)
Patient left a message on the [facility] stating that she wants to results of the blood work and ct scan

## 2023-05-29 ENCOUNTER — Other Ambulatory Visit: Payer: Self-pay | Admitting: Internal Medicine

## 2023-06-03 ENCOUNTER — Telehealth: Payer: Self-pay | Admitting: Neurology

## 2023-06-03 NOTE — Telephone Encounter (Signed)
Patient called but I was having a hard time understanding her. She mentioned she needs to speak with chelsea about her sinemet.

## 2023-06-03 NOTE — Telephone Encounter (Signed)
Called apteitn back and after looking at it old dose was 2/1/2/1

## 2023-06-03 NOTE — Telephone Encounter (Signed)
Called patient and she stated that when she was taking the dose 2/1/2/1 she was feeling better. Patient stated she overall feels bad unable to move with her bad leg. Voice is really bad as well as speech. She feels it has been going on for 2-3 weeks. Patient wants to see if going back to the  previous dose

## 2023-06-06 IMAGING — US US EXTREM LOW VENOUS*L*
1 series · 14 of 24 positions shown · non-contrast
Comparison: 09/11/2021

CLINICAL DATA: Left lower extremity swelling

EXAM:
Left LOWER EXTREMITY VENOUS DOPPLER ULTRASOUND
TECHNIQUE: Gray-scale sonography with compression, as well as color and duplex
ultrasound, were performed to evaluate the deep venous system(s)
from the level of the common femoral vein through the popliteal and
proximal calf veins.

[Series 1: us venous img lower uni left (dvt) · portal-venous · 14 of 32 slices shown]
[im 1/32]
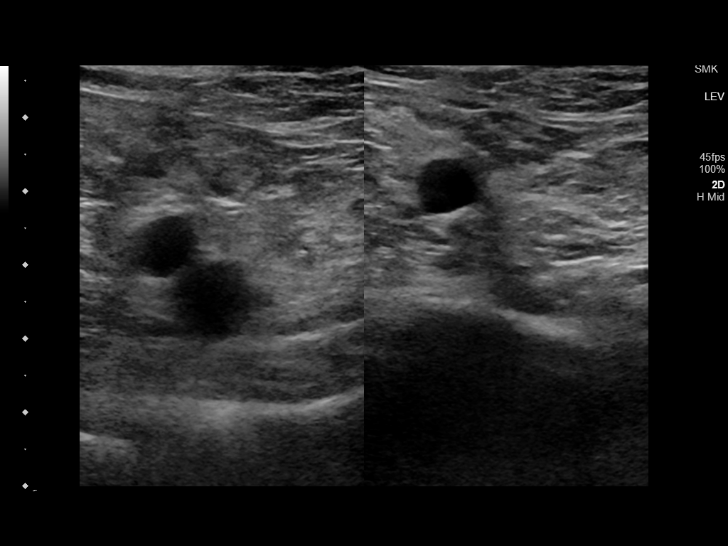
[im 3/32]
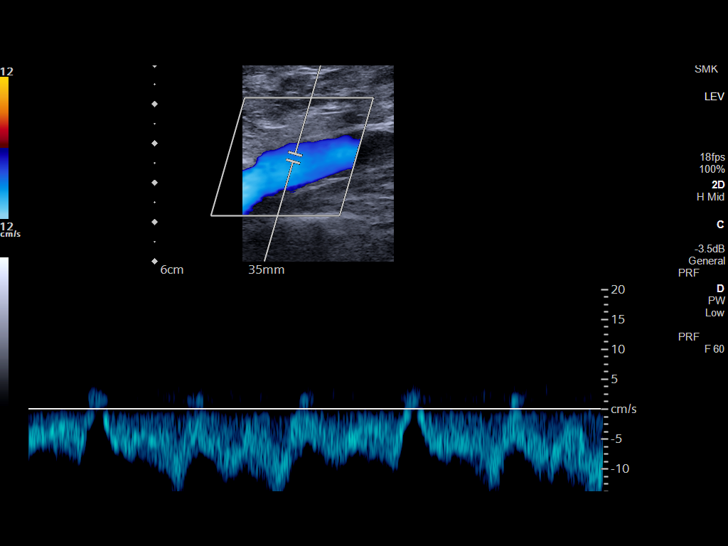
[im 6/32]
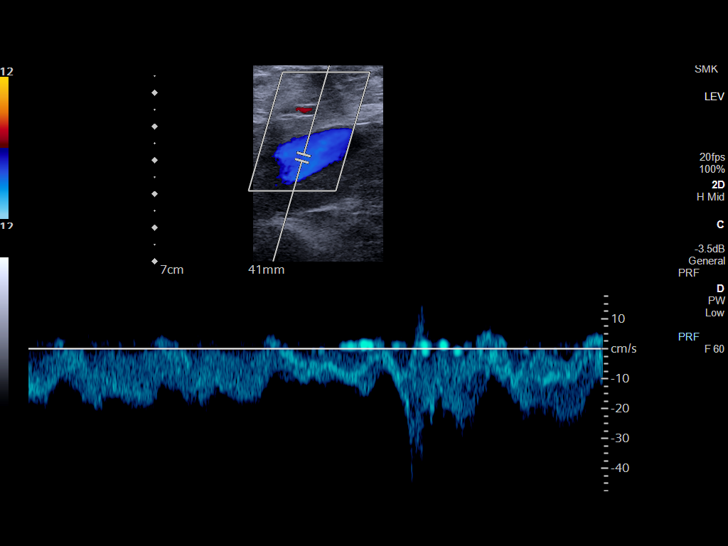
[im 9/32]
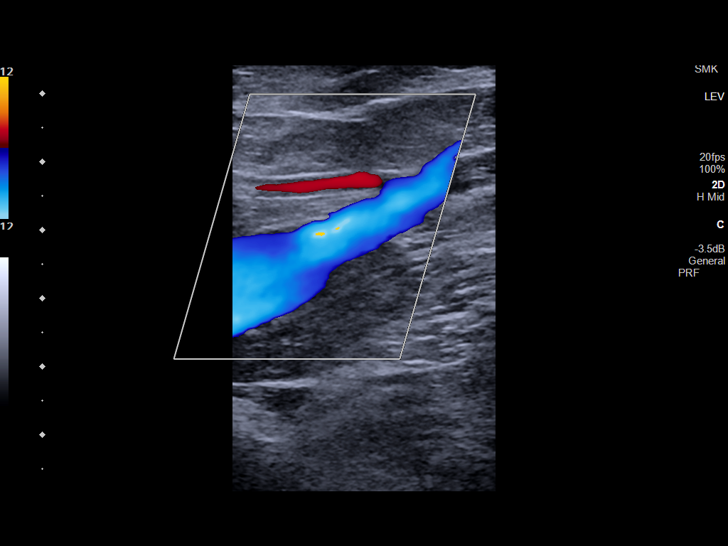
[im 10/32]
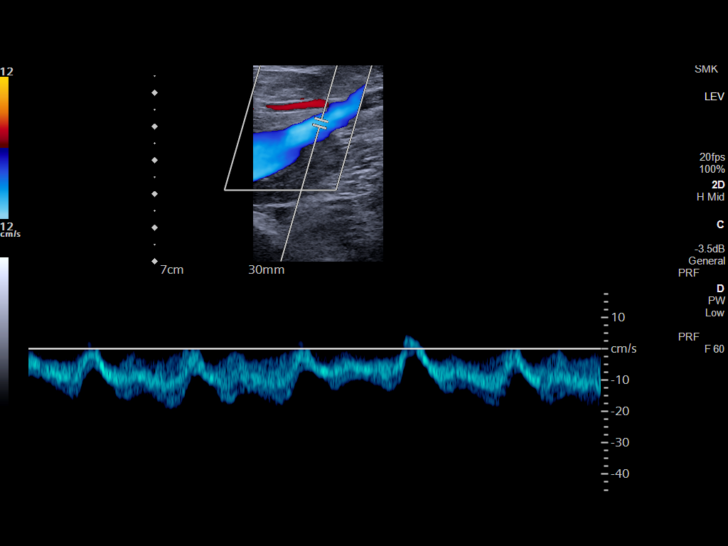
[im 13/32]
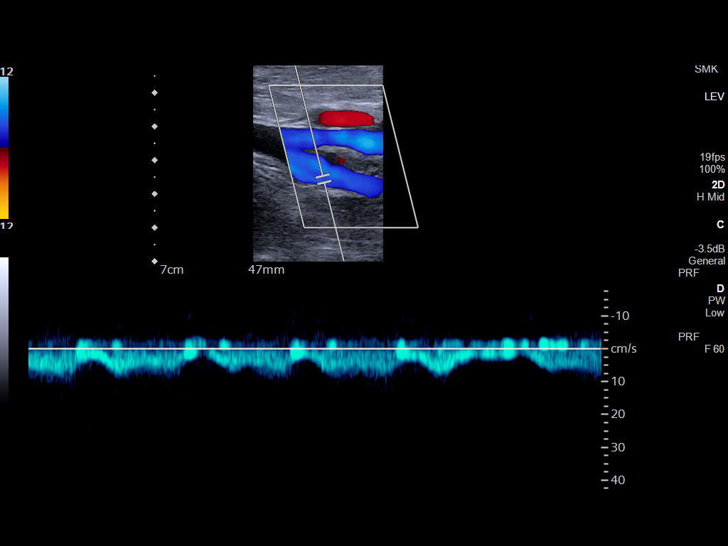
[im 15/32]
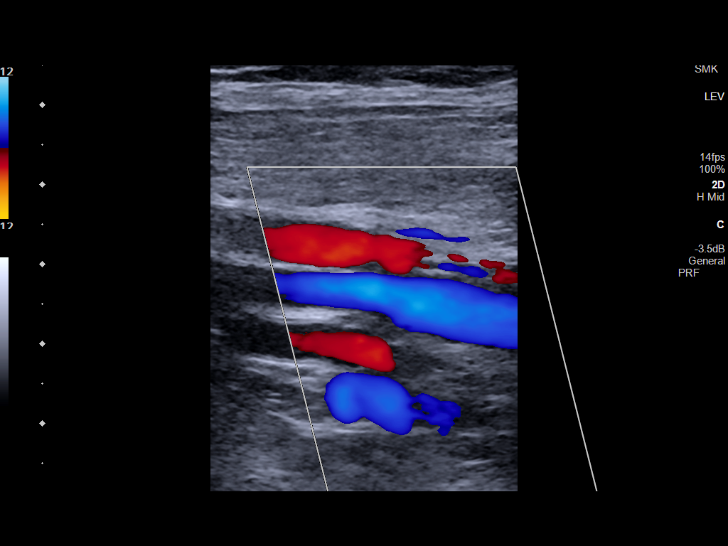
[im 17/32]
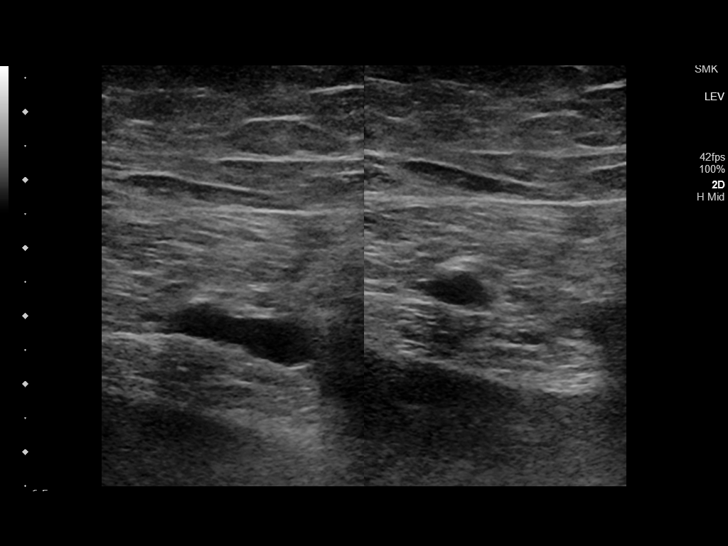
[im 19/32]
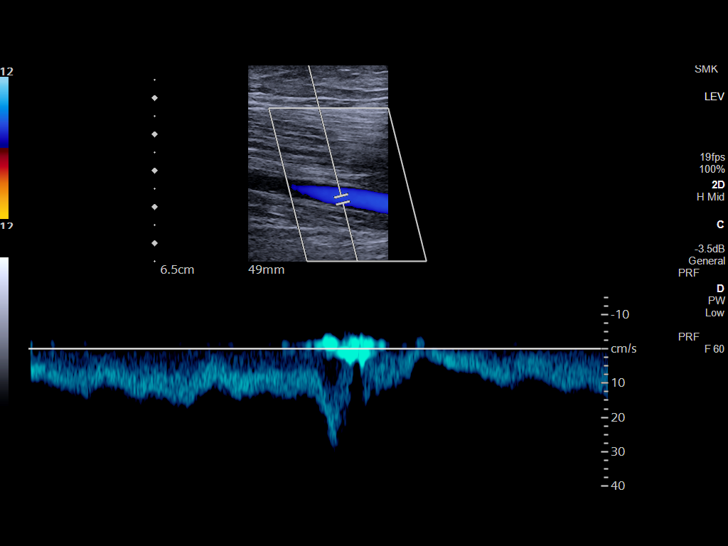
[im 22/32]
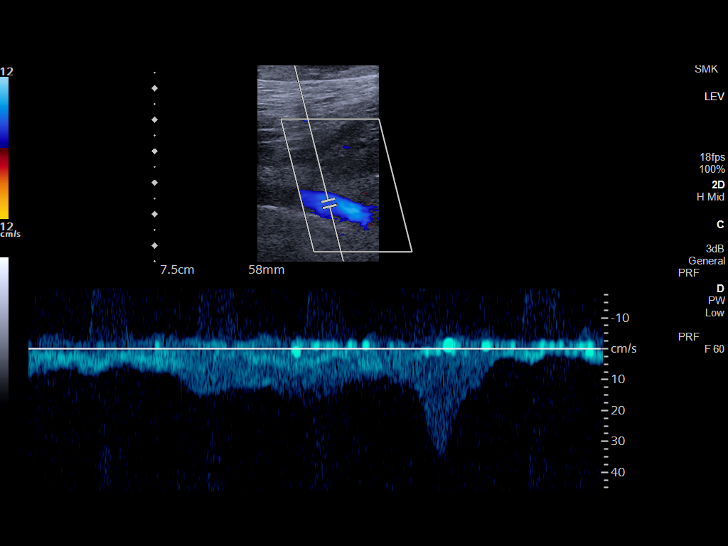
[im 25/32]
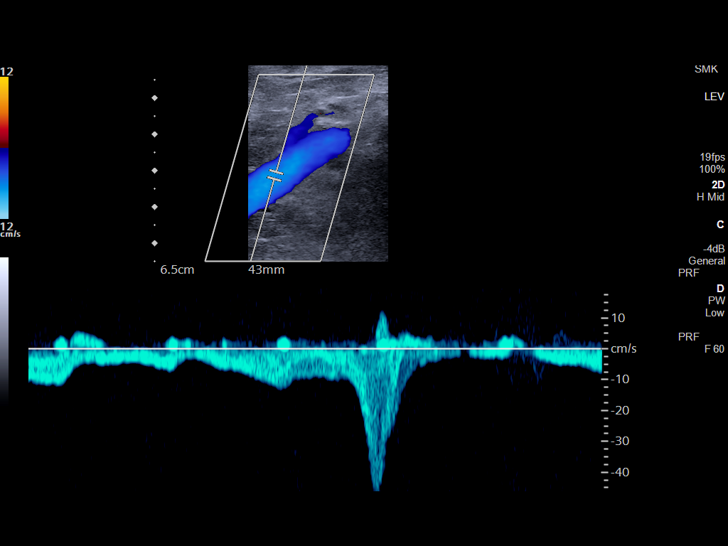
[im 26/32]
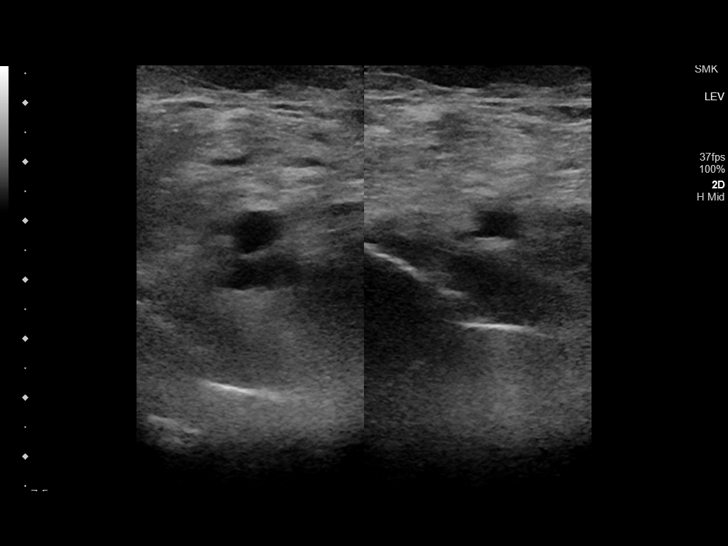
[im 29/32]
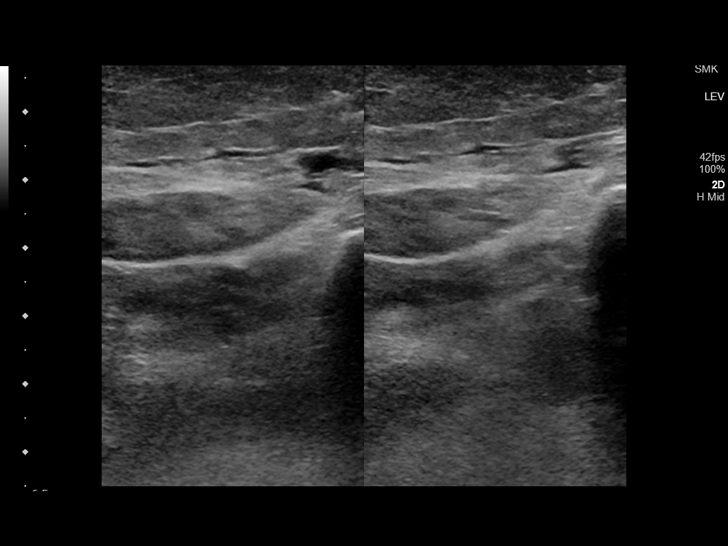
[im 32/32]
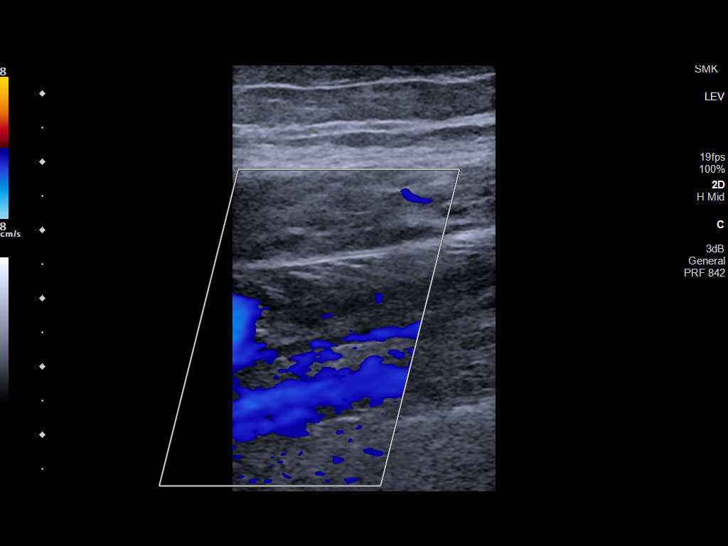

[14 of 24 positions shown; findings below may reference images not displayed]

FINDINGS: VENOUS

Normal compressibility of the common femoral, superficial femoral,
and popliteal veins, as well as the visualized calf veins.
Visualized portions of profunda femoral vein and great saphenous
vein unremarkable. No filling defects to suggest DVT on grayscale or
color Doppler imaging. Doppler waveforms show normal direction of
venous flow, normal respiratory plasticity and response to
augmentation.

Limited views of the contralateral common femoral vein are
unremarkable.

OTHER

None.

Limitations: none
IMPRESSION: Negative.

## 2023-06-26 ENCOUNTER — Other Ambulatory Visit: Payer: Self-pay | Admitting: Internal Medicine

## 2023-06-28 IMAGING — DX DG CHEST 2V
2 series · 2 of 2 positions shown · non-contrast
Comparison: Multiple chest x-rays since November 02, 2019

CLINICAL DATA: Shortness of breath.  Pulmonary edema.

EXAM:
CHEST - 2 VIEW

[chest pa]
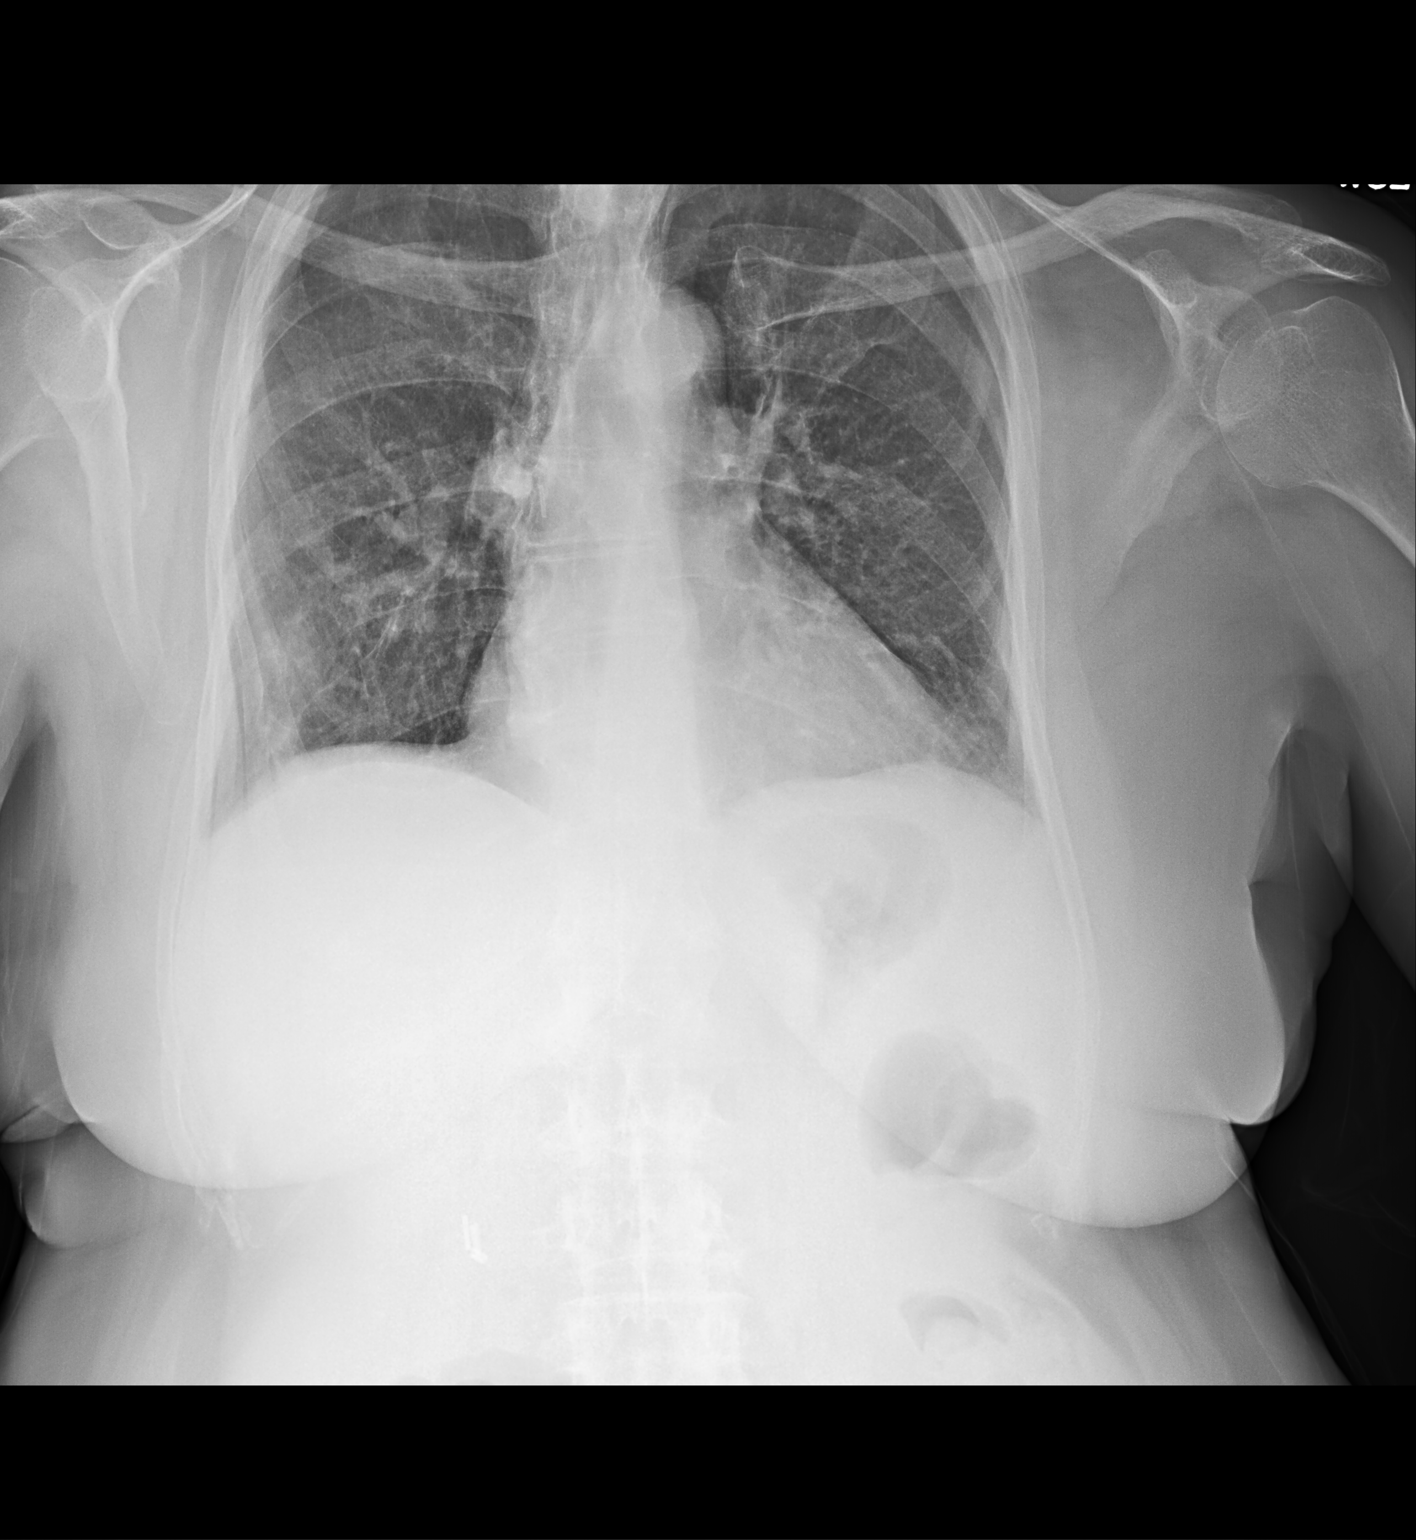

[chest lat]
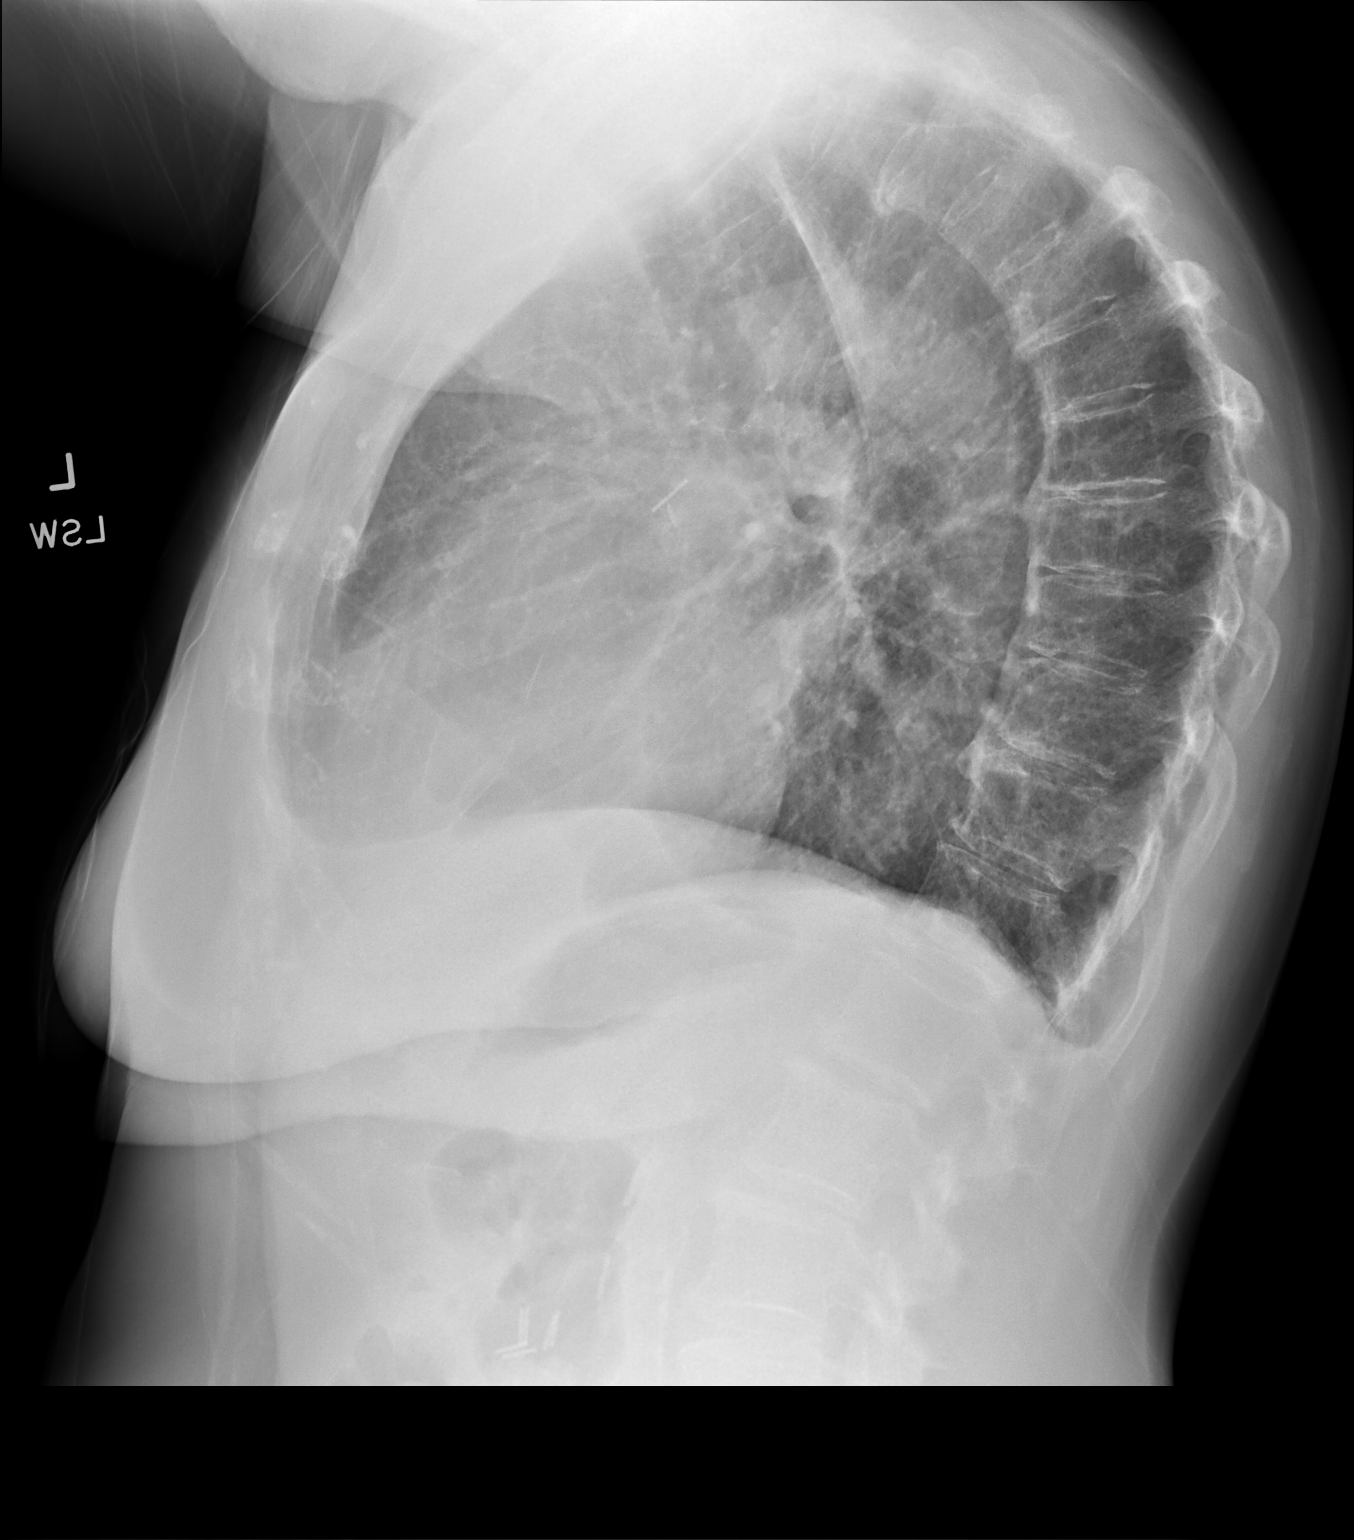

[2 of 2 positions shown; findings below may reference images not displayed]

FINDINGS: Opacity in the periphery of the right base is unchanged since November 02, 2019, considered benign. The heart size is borderline to mildly
enlarged but stable. The hila and mediastinum are normal. No
pneumothorax. No nodules or masses. Tiny effusions identified on the
lateral view. No overt edema.
IMPRESSION: No acute abnormalities or changes. Tiny pleural effusions best seen
on the lateral view.

## 2023-07-01 IMAGING — CT CT HEAD W/O CM
4 series · 17 of 47 positions shown, 19 images · non-contrast
Comparison: CT head without contrast 09/19/2020

CLINICAL DATA: Dizziness. Nonspecific.



[Series 2: head wo · axial · 0.39mm/px · z∈[-94,+26]mm · 7 of 34 slices shown, 9 images]
[im 5/34  brain]
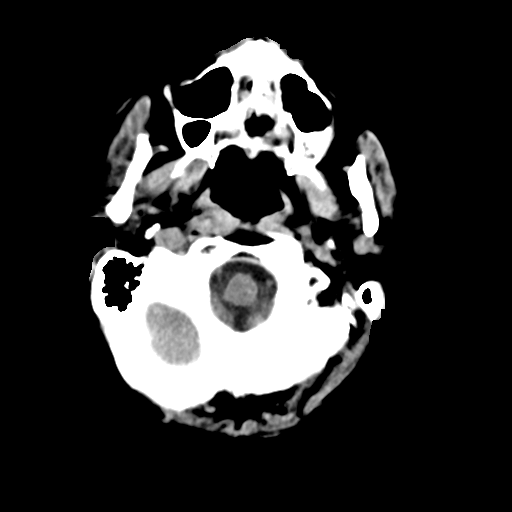
[im 5/34  bone]
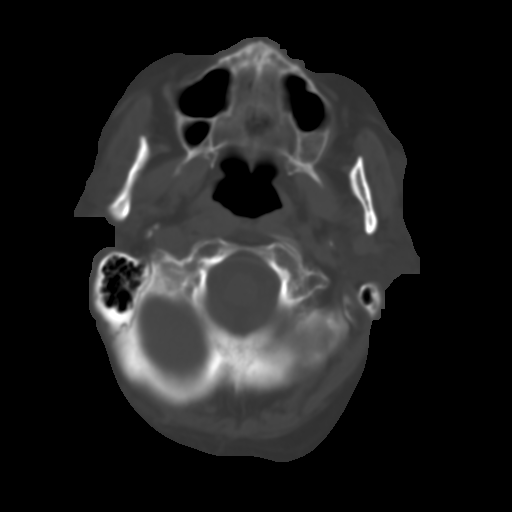
[im 9/34  brain]
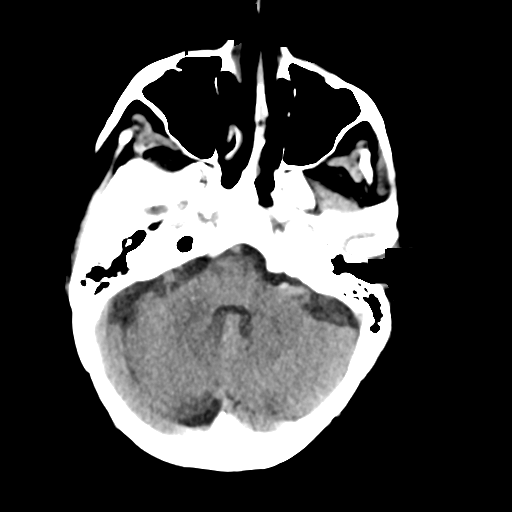
[im 13/34  brain]
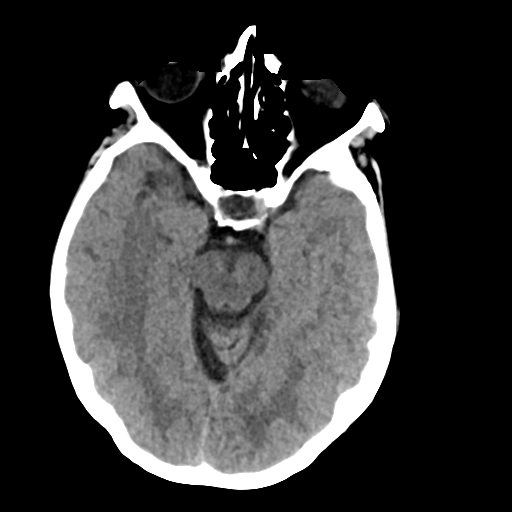
[im 17/34  brain]
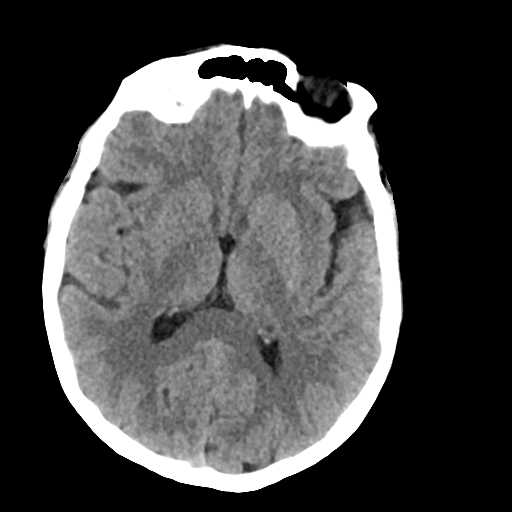
[im 21/34  brain]
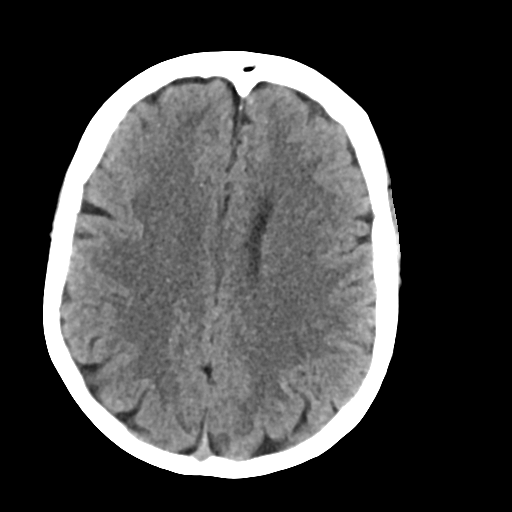
[im 21/34  bone]
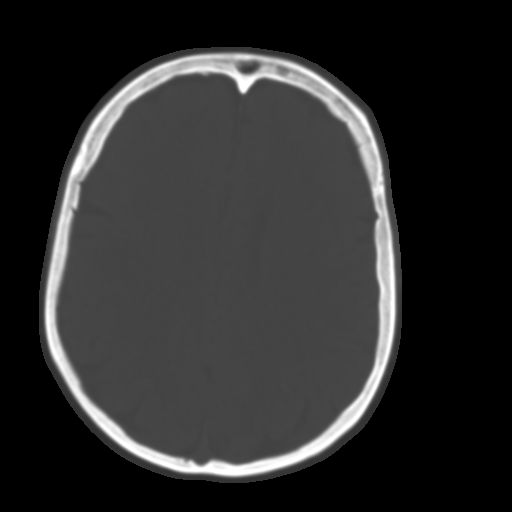
[im 25/34  brain]
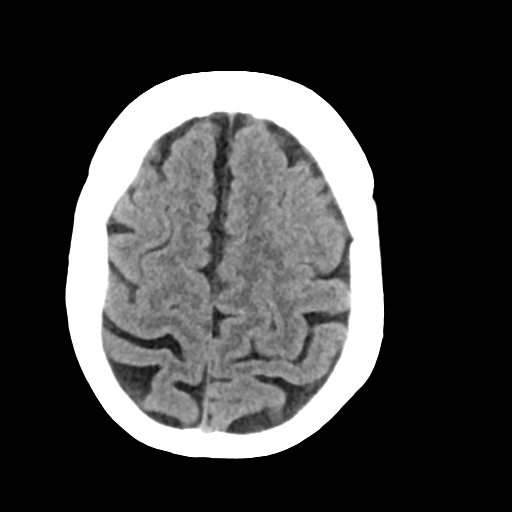
[im 29/34  brain]
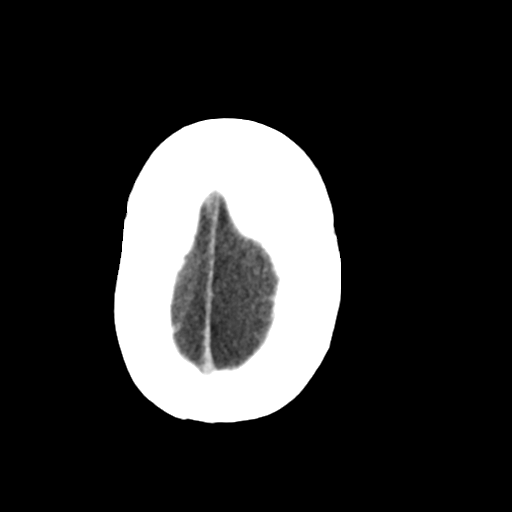

[Series 3: head bone · axial · 0.39mm/px · z∈[-98,-40]mm · 4 of 85 slices shown]
[im 9/85  bone]
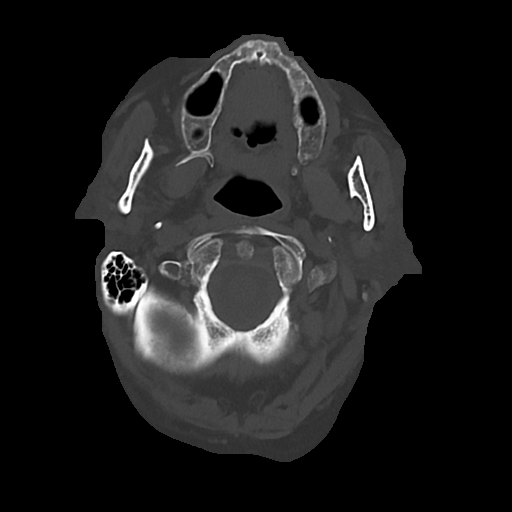
[im 17/85  bone]
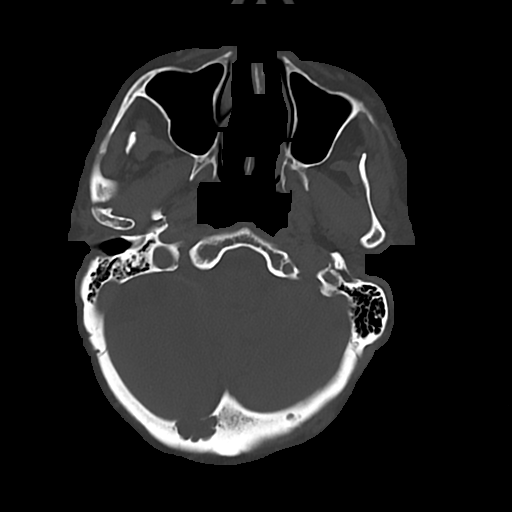
[im 26/85  bone]
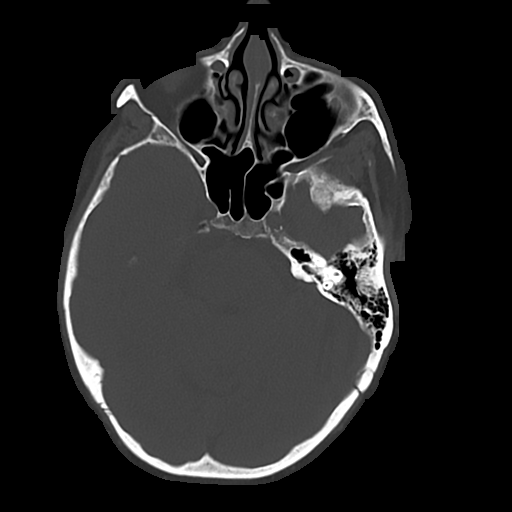
[im 38/85  bone]
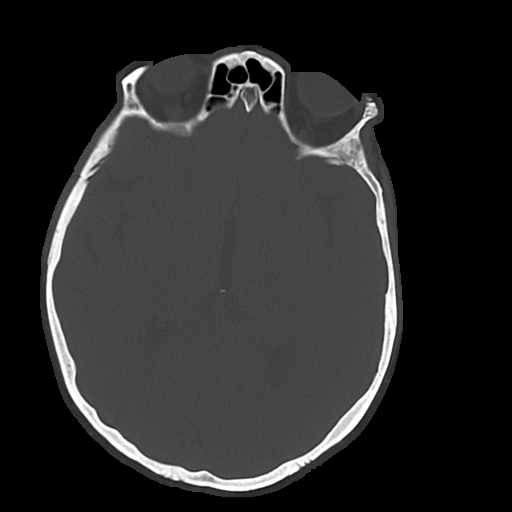

[Series 4: cor soft · coronal · 0.32mm/px · 3 of 66 slices shown]
[im 22/66  brain]
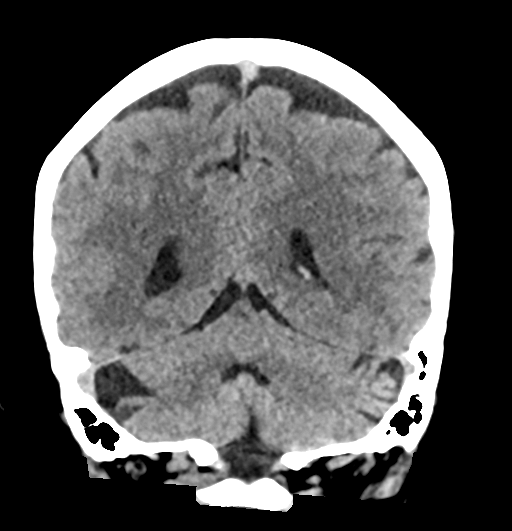
[im 29/66  brain]
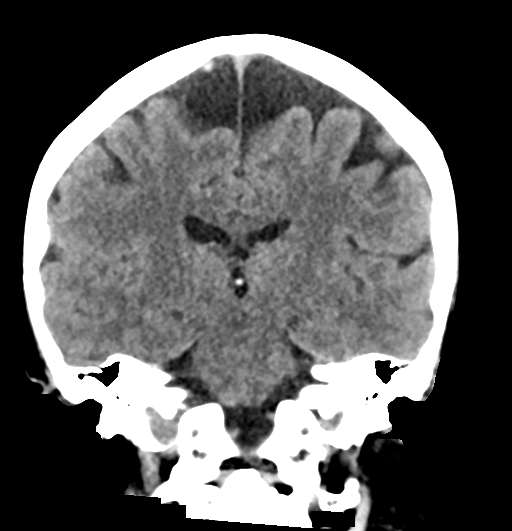
[im 37/66  brain]
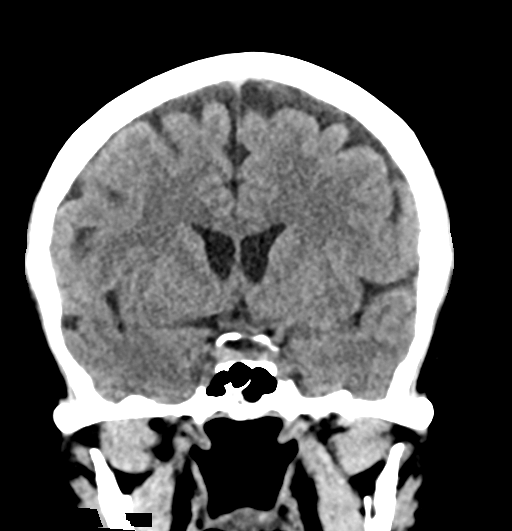

[Series 5: sag soft · sagittal · 0.34mm/px · 3 of 56 slices shown]
[im 19/56  brain]
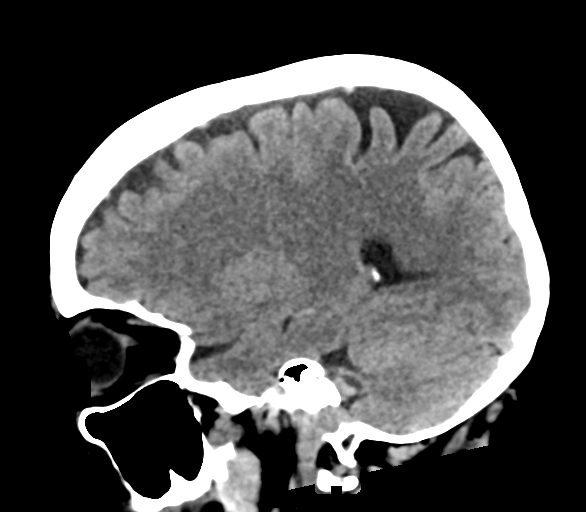
[im 28/56  brain]
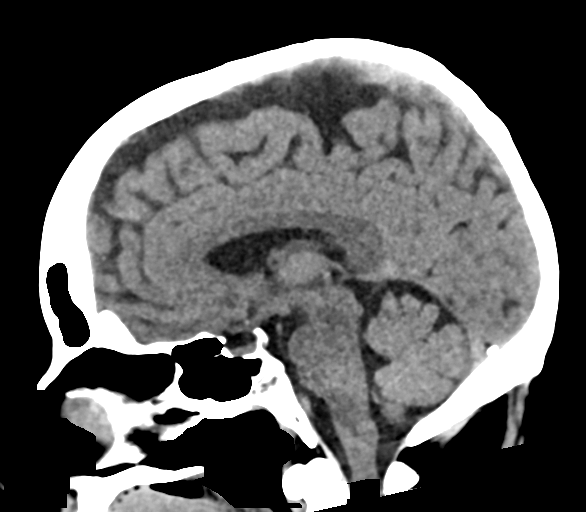
[im 37/56  brain]
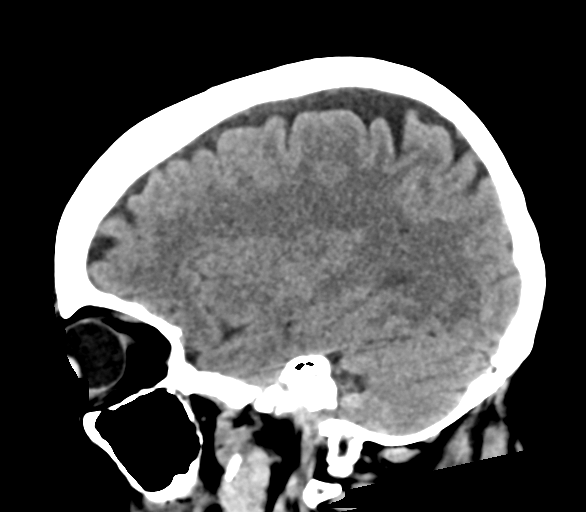

[17 of 47 positions shown; findings below may reference images not displayed]

FINDINGS: Brain: No acute infarct, hemorrhage, or mass lesion is present. The
ventricles are of normal size. No significant extraaxial fluid
collection is present. No significant white matter lesions are
present. The brainstem and cerebellum are within normal limits.

Vascular: No hyperdense vessel or unexpected calcification.

Skull: Calvarium is intact. No focal lytic or blastic lesions are
present. No significant extracranial soft tissue lesion is present.

Sinuses/Orbits: The paranasal sinuses and mastoid air cells are
clear. The globes and orbits are within normal limits.
IMPRESSION: Negative CT of the head.

## 2023-07-01 IMAGING — MR MR HEAD W/O CM
11 series · 48 of 48 positions shown · non-contrast
Comparison: Head CT from earlier today

CLINICAL DATA: Dizziness, shuffling feet, and weakness.

EXAM:
MRI HEAD WITHOUT CONTRAST
TECHNIQUE: Multiplanar, multiecho pulse sequences of the brain and surrounding
structures were obtained without intravenous contrast.

[Series 5: ax dwi_tracew · axial · 3.0mm · 0.71mm/px · z∈[-70,+90]mm · 5 of 56 slices shown]
[im 1/56]
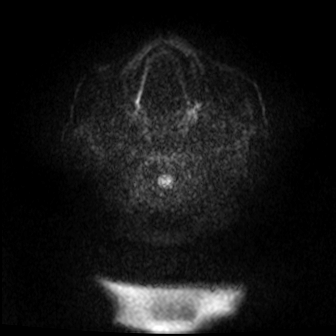
[im 14/56]
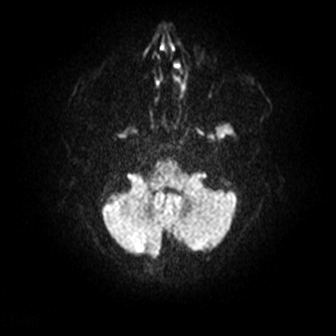
[im 28/56]
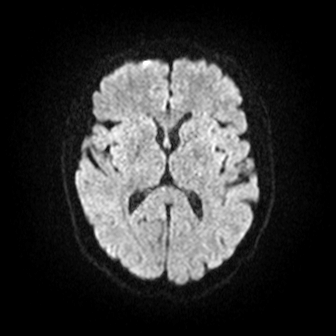
[im 42/56]
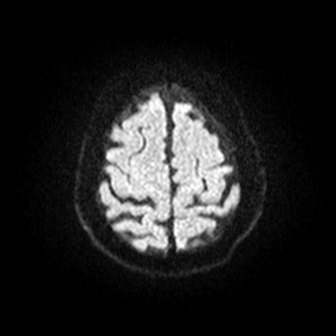
[im 56/56]
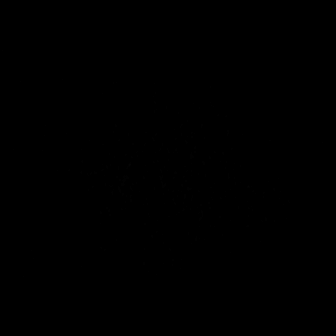

[Series 6: ax dwi_adc · axial · 3.0mm · 0.71mm/px · z∈[-70,+87]mm · 4 of 55 slices shown]
[im 1/55]
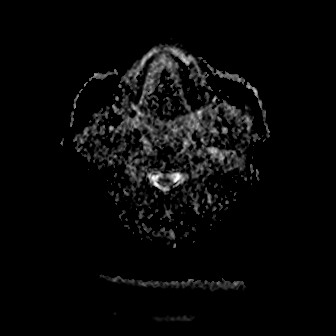
[im 19/55]
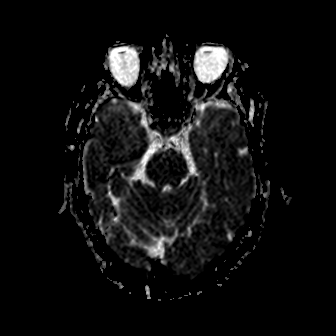
[im 37/55]
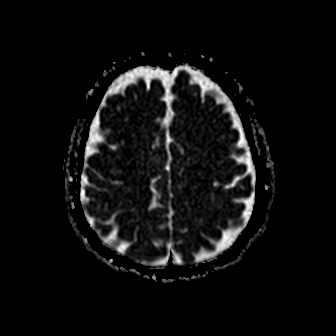
[im 55/55]
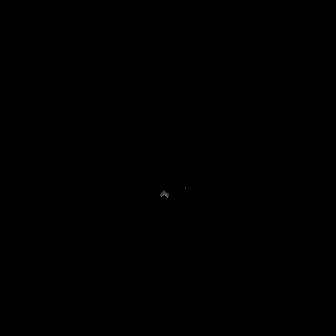

[Series 7: cor dwi_tracew · coronal · 5.0mm · 0.68mm/px · 3 of 38 slices shown]
[im 1/38]
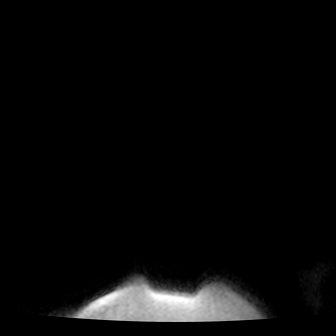
[im 19/38]
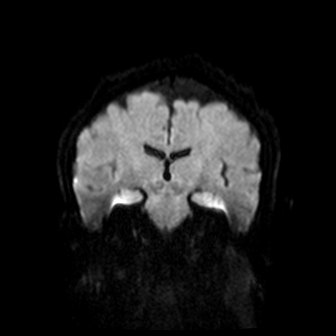
[im 38/38]
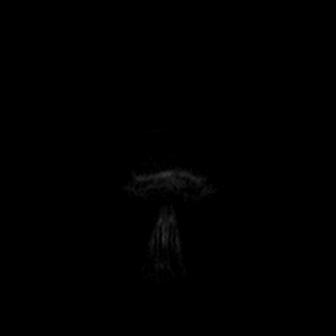

[Series 8: cor dwi_adc · coronal · 5.0mm · 0.68mm/px · 3 of 38 slices shown]
[im 1/38]
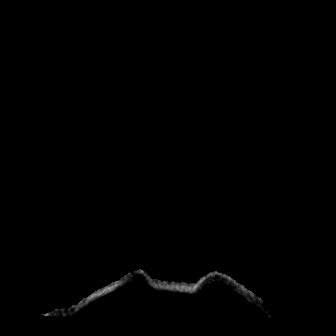
[im 19/38]
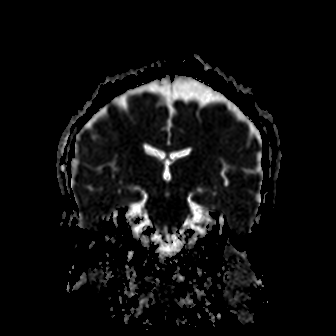
[im 38/38]
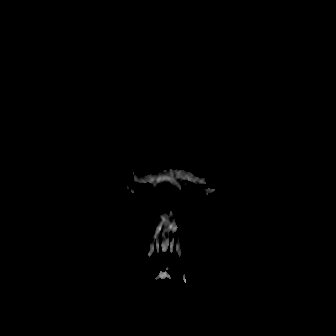

[Series 9: T1 · sagittal · 5.0mm · 0.47mm/px · 2 of 22 slices shown (1 of 2)]
[im 1/22]
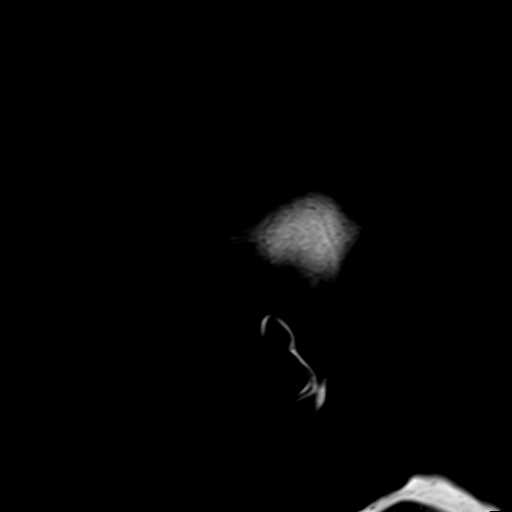
[im 22/22]
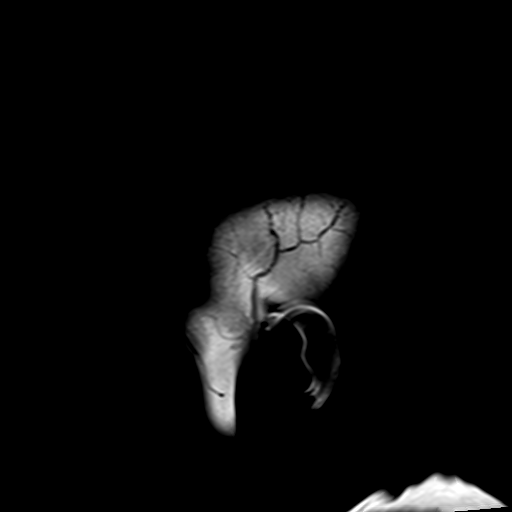

[Series 10: T2 · axial · 5.0mm · 0.86mm/px · z∈[-62,+77]mm · 2 of 25 slices shown (1 of 2)]
[im 1/25]
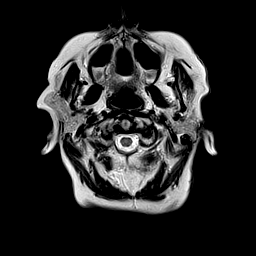
[im 25/25]
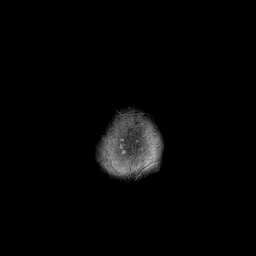

[Series 12: ax swi_pha · axial · 3.0mm · 0.90mm/px · z∈[-71,+88]mm · 5 of 56 slices shown]
[im 1/56]
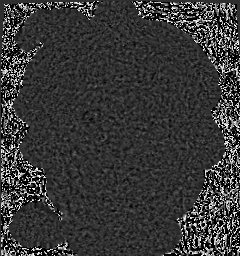
[im 14/56]
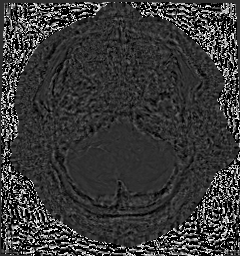
[im 28/56]
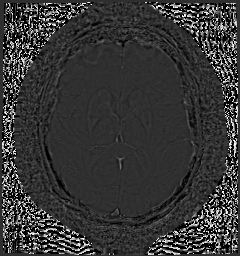
[im 42/56]
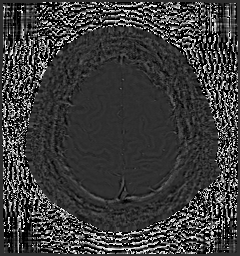
[im 56/56]
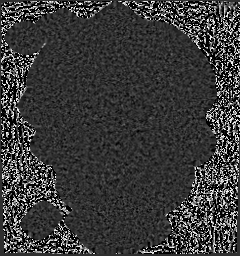

[Series 13: ax swi_swi · axial · 3.0mm · 0.90mm/px · z∈[-71,+88]mm · 5 of 56 slices shown]
[im 1/56]
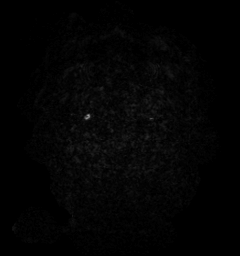
[im 14/56]
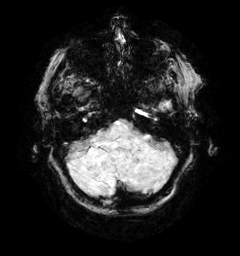
[im 28/56]
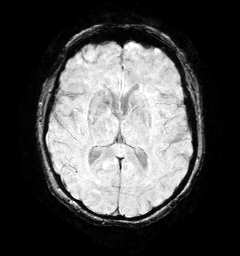
[im 42/56]
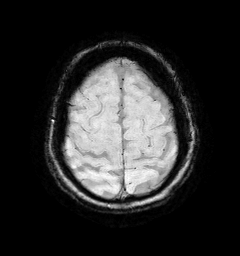
[im 56/56]
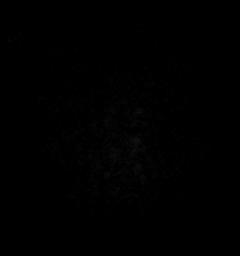

[Series 15: FLAIR · axial · 3.0mm · 0.69mm/px · z∈[-71,+86]mm · 4 of 55 slices shown]
[im 1/55]
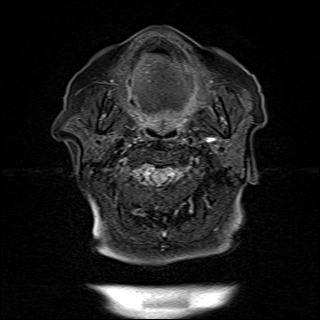
[im 19/55]
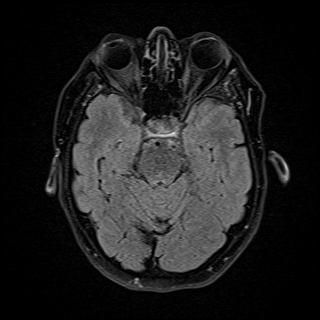
[im 37/55]
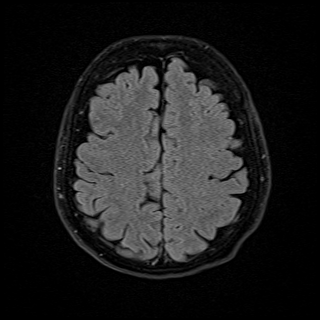
[im 55/55]
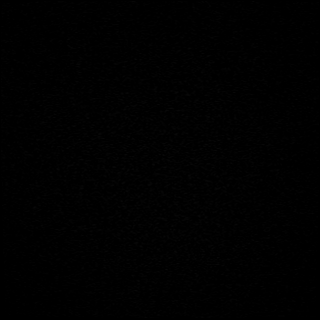

[Series 16: T1 · axial · 1.0mm · 0.98mm/px · z∈[-66,+88]mm · 13 of 160 slices shown (2 of 2)]
[im 1/160]
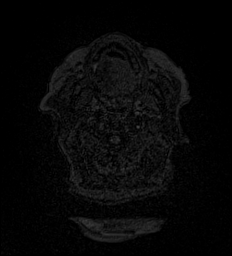
[im 14/160]
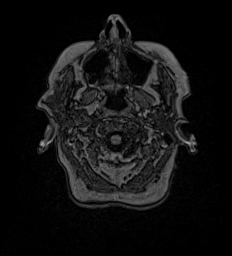
[im 27/160]
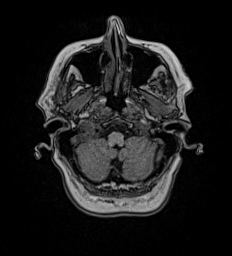
[im 40/160]
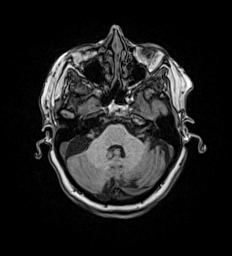
[im 54/160]
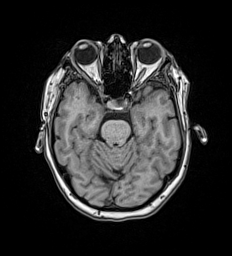
[im 67/160]
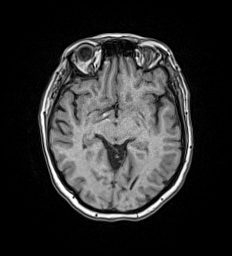
[im 80/160]
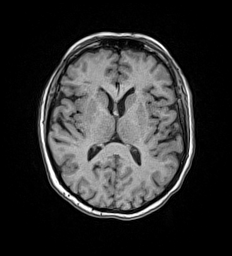
[im 93/160]
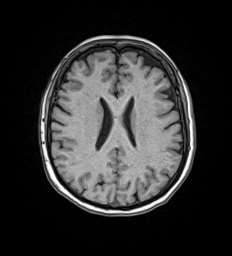
[im 107/160]
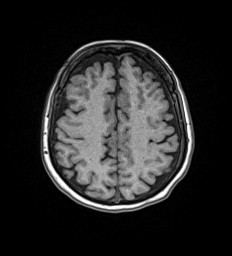
[im 120/160]
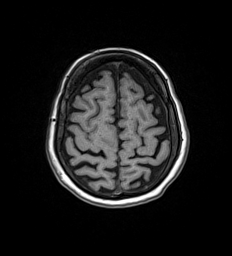
[im 133/160]
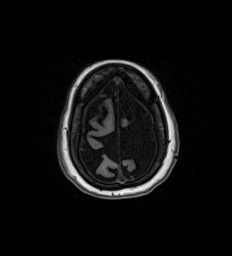
[im 146/160]
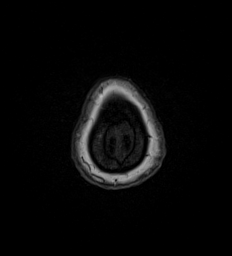
[im 160/160]
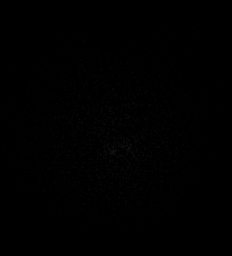

[Series 17: T2 · coronal · 5.0mm · 0.86mm/px · 2 of 28 slices shown (2 of 2)]
[im 1/28]
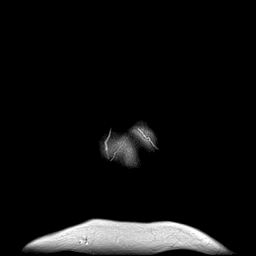
[im 28/28]
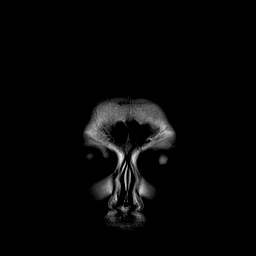

[48 of 48 positions shown; findings below may reference images not displayed]

FINDINGS: Brain: No acute infarction, hemorrhage, hydrocephalus, extra-axial
collection or mass lesion. Preserved brain volume. Age normal
appearance of the white matter.

Vascular: Normal flow voids.

Skull and upper cervical spine: Normal marrow signal.

Sinuses/Orbits: Negative.
IMPRESSION: Age normal brain MRI.

## 2023-07-15 ENCOUNTER — Ambulatory Visit (INDEPENDENT_AMBULATORY_CARE_PROVIDER_SITE_OTHER): Payer: 59 | Admitting: Internal Medicine

## 2023-07-15 VITALS — BP 122/70 | HR 76 | Temp 97.8°F | Resp 16 | Ht 64.0 in | Wt 172.4 lb

## 2023-07-15 DIAGNOSIS — I700 Atherosclerosis of aorta: Secondary | ICD-10-CM

## 2023-07-15 DIAGNOSIS — R600 Localized edema: Secondary | ICD-10-CM | POA: Diagnosis not present

## 2023-07-15 DIAGNOSIS — Z8589 Personal history of malignant neoplasm of other organs and systems: Secondary | ICD-10-CM

## 2023-07-15 DIAGNOSIS — E782 Mixed hyperlipidemia: Secondary | ICD-10-CM

## 2023-07-15 DIAGNOSIS — G20A1 Parkinson's disease without dyskinesia, without mention of fluctuations: Secondary | ICD-10-CM

## 2023-07-15 DIAGNOSIS — G903 Multi-system degeneration of the autonomic nervous system: Secondary | ICD-10-CM | POA: Diagnosis not present

## 2023-07-15 DIAGNOSIS — R739 Hyperglycemia, unspecified: Secondary | ICD-10-CM | POA: Diagnosis not present

## 2023-07-15 DIAGNOSIS — E7800 Pure hypercholesterolemia, unspecified: Secondary | ICD-10-CM

## 2023-07-15 DIAGNOSIS — I10 Essential (primary) hypertension: Secondary | ICD-10-CM | POA: Diagnosis not present

## 2023-07-15 DIAGNOSIS — J019 Acute sinusitis, unspecified: Secondary | ICD-10-CM

## 2023-07-15 DIAGNOSIS — K760 Fatty (change of) liver, not elsewhere classified: Secondary | ICD-10-CM

## 2023-07-15 DIAGNOSIS — I779 Disorder of arteries and arterioles, unspecified: Secondary | ICD-10-CM

## 2023-07-15 DIAGNOSIS — N183 Chronic kidney disease, stage 3 unspecified: Secondary | ICD-10-CM

## 2023-07-15 DIAGNOSIS — F329 Major depressive disorder, single episode, unspecified: Secondary | ICD-10-CM

## 2023-07-15 DIAGNOSIS — I773 Arterial fibromuscular dysplasia: Secondary | ICD-10-CM | POA: Diagnosis not present

## 2023-07-15 DIAGNOSIS — Z85038 Personal history of other malignant neoplasm of large intestine: Secondary | ICD-10-CM | POA: Diagnosis not present

## 2023-07-15 DIAGNOSIS — F339 Major depressive disorder, recurrent, unspecified: Secondary | ICD-10-CM

## 2023-07-15 MED ORDER — AMOXICILLIN 875 MG PO TABS: 875.0000 mg | ORAL_TABLET | Freq: Two times a day (BID) | ORAL | 0 refills | Status: AC

## 2023-07-15 NOTE — Patient Instructions (Signed)
Nasacort nasal spray - 2 sprays each nostril one time per day.   Saline nasal spray - flush nose 1-2 x/day.

## 2023-07-15 NOTE — Progress Notes (Unsigned)
Subjective:    Patient ID: Victoria Hanson, female    DOB: 07-Apr-1951, 73 y.o.   MRN: [id number]  Patient here for  Chief Complaint  Patient presents with   Medical Management of Chronic Issues    HPI Here for a scheduled follow up -    Past Medical History:  Diagnosis Date   Anxiety    Asthma    Atypical chest pain    a. 2003 Cath: reportedly nl per pt [name]);  b. 04/2015 [facility] Admission, neg troponin.   Colon cancer (HCC) 1999   a. 1999: Pt had partial colectomy. Did develop lung metastasis. Had right upper lobectomy in 01/2007 then had associated chemotherapy.   COPD (chronic obstructive pulmonary disease) (HCC)    DDD (degenerative disc disease), lumbosacral    Depression    Echocardiogram abnormal    a. 02/2010 Echo: EF 55-60%, mild LAE, no regional wall motion abnormalities   Elevated transaminase level    a. ? NASH;  b. 06/2015 - nl LFTs.   GERD (gastroesophageal reflux disease)    Hematuria    Holter monitor, abnormal    a. 12/2009 Holter monitoring: NSR, rare PACs and PVCs.   Hypercholesterolemia    Hyperlipidemia    Hypertension    Leaky heart valve    a. Per pt report - no evidence of valvular abnormalities on prior echoes.   Lesion of skin of Right breast 10/20/2017   Lung metastases 2008   a. S/P resection (lobectomy - right)   Morbid obesity (HCC)    Nephrolithiasis    pt denies   Parkinson disease (HCC) 08/2016   [name]   Past Surgical History:  Procedure Laterality Date   BREAST EXCISIONAL BIOPSY Right 10/07/2009   benign   CARDIAC CATHETERIZATION  2003   negative as per pt report   CHOLECYSTECTOMY  2000   COLON SURGERY     Colon cancer, removed part of large colon   COLONOSCOPY N/A 04/25/2015   Procedure: COLONOSCOPY;  Surgeon: Scot Jun, MD;  Location: [facility] ENDOSCOPY;  Service: Endoscopy;  Laterality: N/A;   COLONOSCOPY WITH PROPOFOL N/A 06/14/2017   Procedure: COLONOSCOPY WITH PROPOFOL;  Surgeon: Scot Jun, MD;  Location:  [facility] ENDOSCOPY;  Service: Endoscopy;  Laterality: N/A;   COLONOSCOPY WITH PROPOFOL N/A 11/17/2019   Procedure: COLONOSCOPY WITH PROPOFOL;  Surgeon: Wyline Mood, MD;  Location: [facility] ENDOSCOPY;  Service: Gastroenterology;  Laterality: N/A;   COLONOSCOPY WITH PROPOFOL N/A 03/21/2021   Procedure: COLONOSCOPY WITH PROPOFOL;  Surgeon: Regis Bill, MD;  Location: [facility] ENDOSCOPY;  Service: Endoscopy;  Laterality: N/A;   ESOPHAGOGASTRODUODENOSCOPY (EGD) WITH PROPOFOL N/A 03/21/2021   Procedure: ESOPHAGOGASTRODUODENOSCOPY (EGD) WITH PROPOFOL;  Surgeon: Regis Bill, MD;  Location: [facility] ENDOSCOPY;  Service: Endoscopy;  Laterality: N/A;   LOBECTOMY  01/2007   Right lung, upper lobe right side removed   REPLACEMENT TOTAL KNEE Left 08/2020   TOTAL ABDOMINAL HYSTERECTOMY     abnormal bleeding   TUBAL LIGATION  1980   Family History  Problem Relation Age of Onset   Coronary artery disease Mother        died @ 33 of MI.   Mental illness Mother    Diabetes Mother    Heart attack Father 88       MI   Cancer Father        lung - died in his mid-70's   Hypertension Father    Diabetes Father    Sudden death Brother  Mental illness Brother    Heart attack Brother        MI @ 12, died @ age 75.   Hypertension Child    Breast cancer Neg Hx    Social History   Socioeconomic History   Marital status: Widowed    Spouse name: Not on file   Number of children: Not on file   Years of education: Not on file   Highest education level: Not on file  Occupational History   Not on file  Tobacco Use   Smoking status: Never    Passive exposure: Yes   Smokeless tobacco: Never   Tobacco comments:    husband and son smoked in her home.  Vaping Use   Vaping status: Never Used  Substance and Sexual Activity   Alcohol use: Not Currently    Alcohol/week: 0.0 standard drinks of alcohol   Drug use: No   Sexual activity: Never  Other Topics Concern   Not on file  Social History Narrative    Widowed.   Disabled (colon/lung CA), retired.   Lives in [place] with her dtr, grand-dtr, and son.   Active.   Gets regular exercise, walks.   Right handed    Social Drivers of Health   Financial Resource Strain: Low Risk  (01/07/2023)   Overall Financial Resource Strain (CARDIA)    Difficulty of Paying Living Expenses: Not hard at all  Food Insecurity: No Food Insecurity (01/07/2023)   Hunger Vital Sign    Worried About Running Out of Food in the Last Year: Never true    Ran Out of Food in the Last Year: Never true  Transportation Needs: No Transportation Needs (01/07/2023)   PRAPARE - Administrator, Civil Service (Medical): No    Lack of Transportation (Non-Medical): No  Physical Activity: Inactive (01/07/2023)   Exercise Vital Sign    Days of Exercise per Week: 0 days    Minutes of Exercise per Session: 0 min  Stress: Stress Concern Present (01/07/2023)   [employer] of Occupational Health - Occupational Stress Questionnaire    Feeling of Stress : Rather much  Social Connections: Moderately Integrated (01/07/2023)   Social Connection and Isolation Panel [NHANES]    Frequency of Communication with Friends and Family: More than three times a week    Frequency of Social Gatherings with Friends and Family: More than three times a week    Attends Religious Services: More than 4 times per year    Active Member of [employer] or Organizations: Yes    Attends Banker Meetings: More than 4 times per year    Marital Status: Widowed     Review of Systems     Objective:     BP 122/70   Pulse 76   Temp 97.8 F (36.6 C)   Resp 16   Ht 5\' 4"  (1.626 m)   Wt 172 lb 6.4 oz (78.2 kg)   SpO2 98%   BMI 29.59 kg/m  Wt Readings from Last 3 Encounters:  07/15/23 172 lb 6.4 oz (78.2 kg)  05/15/23 179 lb 6.4 oz (81.4 kg)  05/03/23 177 lb (80.3 kg)    Physical Exam  {Perform Simple Foot Exam  Perform Detailed exam:1} {Insert foot Exam  (Optional):30965}   Outpatient Encounter Medications as of 07/15/2023  Medication Sig   albuterol (VENTOLIN HFA) 108 (90 Base) MCG/ACT inhaler INHALE 2 INHALATIONS BY MOUTH INTO THE LUNGS EVERY 6 HOURS AS NEEDED FOR WHEEZING  ALPRAZolam (XANAX) 0.5 MG tablet TAKE 1/2 TABLET BY MOUTH ONCE DAILY AS NEEDED   AMBULATORY NON FORMULARY MEDICATION Abdominal compression binder   amoxicillin (AMOXIL) 875 MG tablet Take 1 tablet (875 mg total) by mouth 2 (two) times daily for 10 days.   aspirin EC 81 MG tablet Take 81 mg by mouth as needed.    Carbidopa-Levodopa ER (SINEMET CR) 25-100 MG tablet controlled release 1.5 tablets qid   cholecalciferol (VITAMIN D3) 25 MCG (1000 UNIT) tablet Take 1,000 Units by mouth daily.   citalopram (CELEXA) 20 MG tablet TAKE 1 TABLET BY MOUTH DAILY   diclofenac Sodium (VOLTAREN ARTHRITIS PAIN) 1 % GEL Apply topically 4 (four) times daily.   fluticasone-salmeterol (ADVAIR HFA) 115-21 MCG/ACT inhaler INHALE 2 INHALATIONS INTO THE LUNGS EVERY 12 HOURS   montelukast (SINGULAIR) 10 MG tablet TAKE 1 TABLET BY MOUTH AT BEDTIME   Spacer/Aero-Holding Chambers DEVI 1 Units by Does not apply route daily.   traZODone (DESYREL) 50 MG tablet TAKE 2 TABLETS BY MOUTH AT BEDTIME   vitamin B-12 (CYANOCOBALAMIN) 100 MCG tablet Take 100 mcg by mouth daily.   vitamin E 180 MG (400 UNITS) capsule Take 400 Units by mouth daily.   furosemide (LASIX) 20 MG tablet Take 1 tablet (20 mg total) by mouth daily.   No facility-administered encounter medications on file as of 07/15/2023.     Lab Results  Component Value Date   WBC 9.9 04/16/2023   HGB 12.0 04/16/2023   HCT 36.7 04/16/2023   PLT 250.0 04/16/2023   GLUCOSE 107 (H) 04/16/2023   CHOL 193 03/14/2023   TRIG 173.0 (H) 03/14/2023   HDL 59.60 03/14/2023   LDLDIRECT 163.4 12/31/2012   LDLCALC 99 03/14/2023   ALT 2 03/14/2023   AST 12 03/14/2023   NA 137 04/16/2023   K 4.3 04/16/2023   CL 105 04/16/2023   CREATININE 1.07 04/16/2023    BUN 21 04/16/2023   CO2 24 04/16/2023   TSH 1.80 04/16/2023   INR 1.0 12/12/2012   HGBA1C 5.8 03/14/2023    CT HEAD WO CONTRAST ( ) Result Date: 05/20/2023 CLINICAL DATA:  Swelling over the left temple and pain EXAM: CT HEAD WITHOUT CONTRAST TECHNIQUE: Contiguous axial images were obtained from the base of the skull through the vertex without intravenous contrast. RADIATION DOSE REDUCTION: This exam was performed according to the departmental dose-optimization program which includes automated exposure control, adjustment of the mA and/or kV according to patient size and/or use of iterative reconstruction technique. COMPARISON:  05/20/2022 FINDINGS: Evaluation is somewhat limited by motion artifact. Brain: No evidence of acute infarction, hemorrhage, mass, mass effect, or midline shift. No hydrocephalus or extra-axial fluid collection. Vascular: No hyperdense vessel. Skull: Negative for fracture or focal lesion. Sinuses/Orbits: No acute finding. Other: The mastoid air cells are well aerated. No abnormality is seen in the region of the left temple. IMPRESSION: No acute intracranial process. Electronically Signed   By: Wiliam Ke M.D.   On: 05/20/2023 03:08       Assessment & Plan:  Orthostatic hypotension due to Parkinson's disease (HCC) -     CBC with Differential/Platelet -     Basic metabolic panel  Other orders -     Amoxicillin; Take 1 tablet (875 mg total) by mouth 2 (two) times daily for 10 days.  Dispense: 14 tablet; Refill: 0     Dale Sandusky, MD

## 2023-07-16 ENCOUNTER — Telehealth: Payer: Self-pay

## 2023-07-16 LAB — CBC WITH DIFFERENTIAL/PLATELET
Basophils Absolute: 0.1 10*3/uL (ref 0.0–0.1)
Basophils Relative: 1 % (ref 0.0–3.0)
Eosinophils Absolute: 0.2 10*3/uL (ref 0.0–0.7)
Eosinophils Relative: 2.1 % (ref 0.0–5.0)
HCT: 37.1 % (ref 36.0–46.0)
Hemoglobin: 12.3 g/dL (ref 12.0–15.0)
Lymphocytes Relative: 25.8 % (ref 12.0–46.0)
Lymphs Abs: 2.2 10*3/uL (ref 0.7–4.0)
MCHC: 33.2 g/dL (ref 30.0–36.0)
MCV: 88.8 fL (ref 78.0–100.0)
Monocytes Absolute: 0.8 10*3/uL (ref 0.1–1.0)
Monocytes Relative: 9.1 % (ref 3.0–12.0)
Neutro Abs: 5.3 10*3/uL (ref 1.4–7.7)
Neutrophils Relative %: 62 % (ref 43.0–77.0)
Platelets: 248 10*3/uL (ref 150.0–400.0)
RBC: 4.18 Mil/uL (ref 3.87–5.11)
RDW: 14.5 % (ref 11.5–15.5)
WBC: 8.5 10*3/uL (ref 4.0–10.5)

## 2023-07-16 LAB — BASIC METABOLIC PANEL
BUN: 16 mg/dL (ref 6–23)
CO2: 25 meq/L (ref 19–32)
Calcium: 8.6 mg/dL (ref 8.4–10.5)
Chloride: 99 meq/L (ref 96–112)
Creatinine, Ser: 1.18 mg/dL (ref 0.40–1.20)
GFR: 45.77 mL/min — ABNORMAL LOW (ref >60.00)
Glucose, Bld: 70 mg/dL (ref 70–99)
Potassium: 3.8 meq/L (ref 3.5–5.1)
Sodium: 134 meq/L — ABNORMAL LOW (ref 135–145)

## 2023-07-16 IMAGING — CR DG CHEST 2V
1 series · 2 of 2 positions shown · non-contrast
Comparison: 11/02/2021

CLINICAL DATA: Shortness of breath

EXAM:
CHEST - 2 VIEW

[Series 1: dg chest 2 view · 0.14mm/px · 2 of 2 slices shown]
[im 1/2]
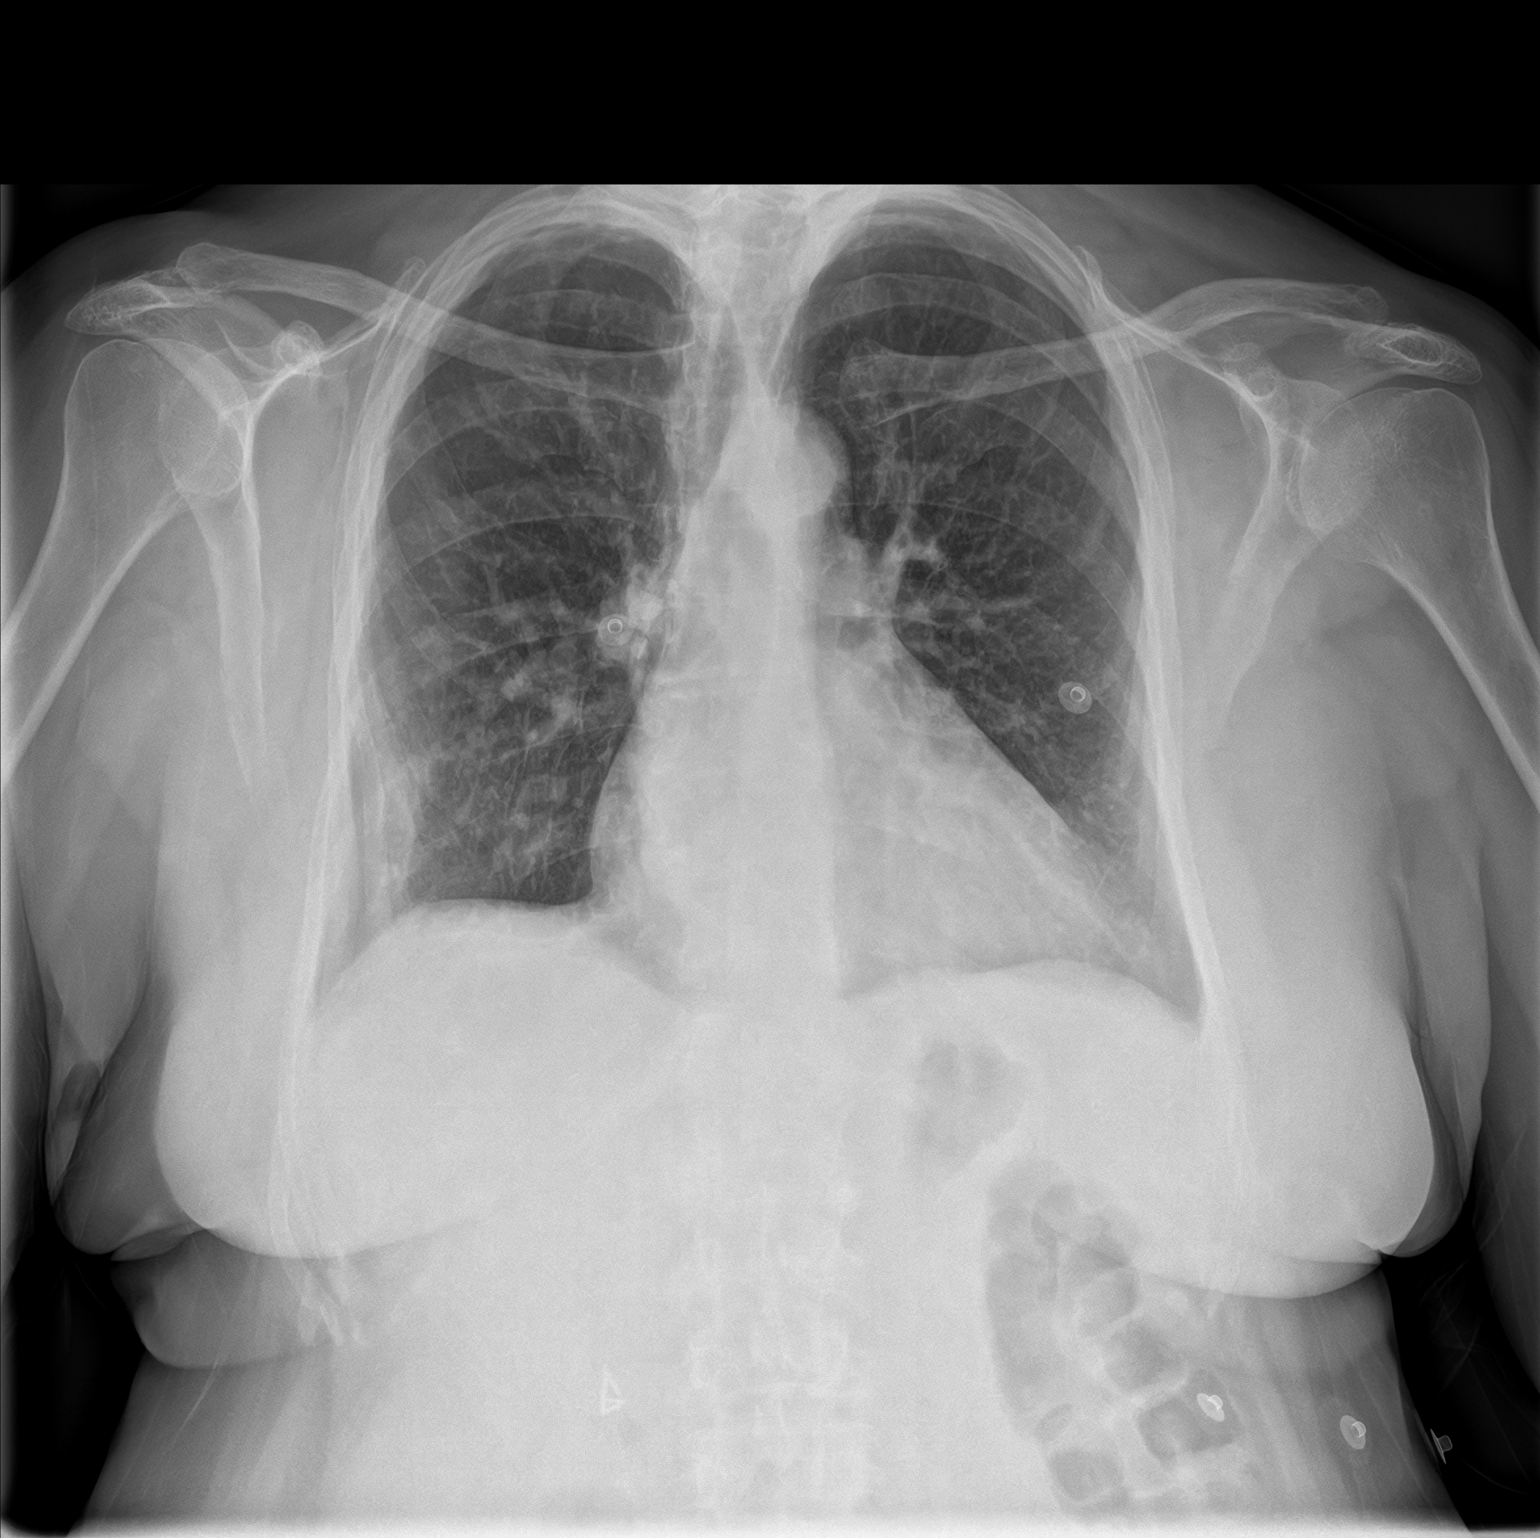
[im 2/2]
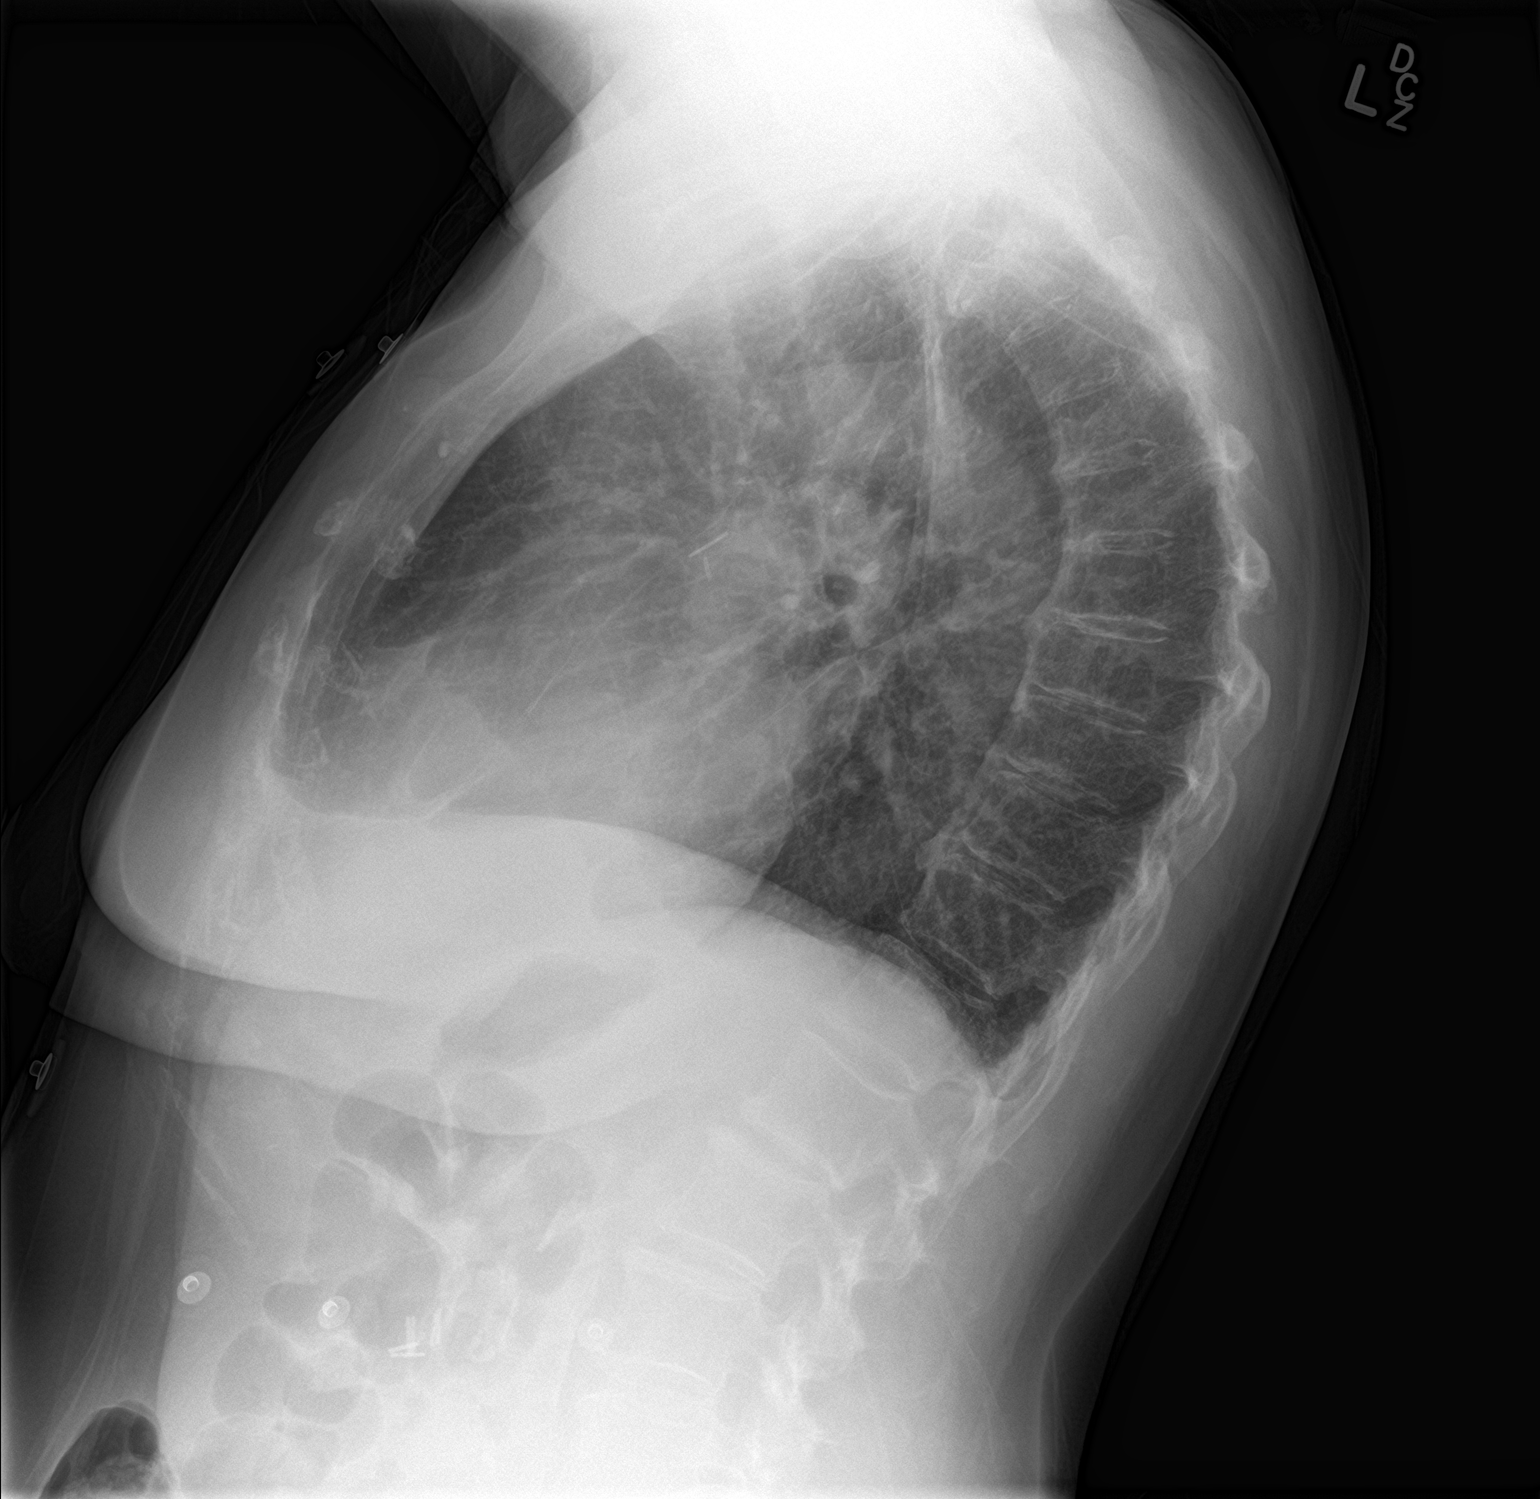

[2 of 2 positions shown; findings below may reference images not displayed]

FINDINGS: Heart is upper limits normal in size. Mediastinal contours within
normal limits. Scarring at the right lung base. No acute confluent
opacities or effusions. No acute bony abnormality.
IMPRESSION: No active cardiopulmonary disease.

## 2023-07-16 NOTE — Telephone Encounter (Addendum)
 Pateitn calling saw PCP yesterday and she is having low blood pressures and feelings of passing out PCP recommended she call us  regarding orthostatic Hypertension due to PD and carbidopa  levodopa 

## 2023-07-17 ENCOUNTER — Encounter: Payer: Self-pay | Admitting: Internal Medicine

## 2023-07-17 ENCOUNTER — Telehealth: Payer: Self-pay | Admitting: Neurology

## 2023-07-17 NOTE — Assessment & Plan Note (Signed)
Has been evaluated by [facility].  Continue risk factor modificaiton.  

## 2023-07-17 NOTE — Assessment & Plan Note (Signed)
 Check cbc and metabolic panel. Blood pressure as outlined. She is not taking any blood pressure medication. Not taking lasix . Discussed staying hydrated. Discussed compression hose. She feels is related to her sinemet . D/w Dr Winferd Hatter

## 2023-07-17 NOTE — Assessment & Plan Note (Signed)
 Avoid antiinflammatories.  Off micardis  due to low blood pressure.  Reports blood pressure varying.  Blood pressure on recheck as outlined. Not taking lasix . Follow pressures. Follow metabolic panel. D/w neurology.

## 2023-07-17 NOTE — Assessment & Plan Note (Signed)
Continue citalopram.   Follow. Doing better.  

## 2023-07-17 NOTE — Assessment & Plan Note (Signed)
Swelling improved.

## 2023-07-17 NOTE — Assessment & Plan Note (Addendum)
Off crestor. Low cholesterol diet and exercise.  Follow lipid panel and liver function tests.

## 2023-07-17 NOTE — Assessment & Plan Note (Signed)
 Off crestor.  Desires not to take.

## 2023-07-17 NOTE — Assessment & Plan Note (Signed)
 Varying pressure as outlined. Not taking any blood pressure medication. Remain off.  Follow pressures.  Follow metabolic panel.

## 2023-07-17 NOTE — Assessment & Plan Note (Signed)
On citalopram.  Feels she is doing well on this medication.  Follow.

## 2023-07-17 NOTE — Assessment & Plan Note (Signed)
 Low carb diet and exercise.  Follow met b and a1c. Recent check 5.9.

## 2023-07-17 NOTE — Assessment & Plan Note (Signed)
 S/p lobectomy.  Followed by pulmonary.  Albuterol inhaler if needed. Breathing stable.

## 2023-07-17 NOTE — Assessment & Plan Note (Signed)
 Symptoms as outlined. Discussed saline nasal spray and steroid nasal spray.  Amoxicillin  as directed.  Follow.  Call with update.

## 2023-07-17 NOTE — Assessment & Plan Note (Signed)
Crestor. Low cholesterol diet and exercise.  Follow lipid panel and liver function tests.  

## 2023-07-17 NOTE — Assessment & Plan Note (Addendum)
 Previously evaluated by [facility]. Off crestor .  Desires not to take.  Continue aspirin .

## 2023-07-17 NOTE — Assessment & Plan Note (Signed)
Diet and exercise.  Follow liver panel. Seen on chest CT angio - 09/11/21 

## 2023-07-17 NOTE — Telephone Encounter (Signed)
 Pt called in wanting to find out what she is supposed to be doing about her medication. She has been having fainting spells and says [facility] knows.

## 2023-07-17 NOTE — Assessment & Plan Note (Signed)
 Seeing Dr Tat.  On sinemet . Varying her dosing given concerns regarding decreased blood pressure. Discuss with Dr Winferd Hatter. Follow. Discussed slow position changes and movements.

## 2023-07-17 NOTE — Assessment & Plan Note (Signed)
 Saw GI 12/20/22 - f/u colon cancer and anemia.  Desires no f/u colonoscopy at this time.

## 2023-07-18 ENCOUNTER — Other Ambulatory Visit: Payer: Self-pay

## 2023-07-18 DIAGNOSIS — G20B1 Parkinson's disease with dyskinesia, without mention of fluctuations: Secondary | ICD-10-CM

## 2023-07-18 DIAGNOSIS — R739 Hyperglycemia, unspecified: Secondary | ICD-10-CM

## 2023-07-18 DIAGNOSIS — E7800 Pure hypercholesterolemia, unspecified: Secondary | ICD-10-CM

## 2023-07-18 MED ORDER — CARBIDOPA-LEVODOPA ER 25-100 MG PO TBCR: EXTENDED_RELEASE_TABLET | ORAL | 0 refills | Status: DC

## 2023-07-18 NOTE — Telephone Encounter (Signed)
 Called pateint and let her know that Dr. Evonnie did speak with her PCP and they are in agreement that patient needs to wear compression binder. They did not know were to go so I looked up places in [place] and refilled patietns meds. Her [place] was in [place] they both understood and had no further questions at this time

## 2023-07-22 ENCOUNTER — Other Ambulatory Visit: Payer: Self-pay | Admitting: Internal Medicine

## 2023-07-22 NOTE — Telephone Encounter (Signed)
Rx ok'd for xanax #30 with no refills.  ?

## 2023-07-23 ENCOUNTER — Other Ambulatory Visit: Payer: Self-pay

## 2023-07-23 ENCOUNTER — Telehealth: Payer: Self-pay

## 2023-07-23 DIAGNOSIS — G20B1 Parkinson's disease with dyskinesia, without mention of fluctuations: Secondary | ICD-10-CM

## 2023-07-23 MED ORDER — CARBIDOPA-LEVODOPA ER 25-100 MG PO TBCR
EXTENDED_RELEASE_TABLET | ORAL | 0 refills | Status: DC
Start: 2023-07-23 — End: 2023-08-07

## 2023-07-23 NOTE — Telephone Encounter (Signed)
Copied from CRM [id number]. Topic: Clinical - Medication Question >> Jul 23, 2023  1:35 PM Denese Killings wrote: Reason for CRM: Patient is requesting a callback regarding her Carbidopa-Levodopa ER (SINEMET CR) 25-100 MG tablet controlled release she stating that she is having a hard time refilling it and wants to know if its going to change. Patient is requesting a callback.

## 2023-07-23 NOTE — Telephone Encounter (Signed)
Spoke with pt and she stated that she has been trying to get her medication refilled. She stated has called Neurology several times and they have not called her back about her medication. I let pt know that I would give Neurology a call to see what is going on. I spoke with Neurology and they stated that they are waiting on prior authorization. I called pt back to let her know. Pt gave a verbal understanding.

## 2023-08-05 NOTE — Progress Notes (Unsigned)
 Assessment/Plan:    1.  Parkinson's disease,              -The biggest issue previously has been her intolerance to medication.  She worries about not having access to levodopa, but I told her that is really not a worry that she needs to take on.  She somewhat changed her dosing of levodopa prior to today's visit and thought she was doing better.  I did increase the dose just a little bit today.  Increase carbidopa/levodopa 25/100 CR, 2 at 7am, 2 at 11am, 1 at 3pm, 2 at 7pm  -refer for PT/OT   2.  Sialorrhea             -This is commonly associated with PD.  We talked about treatments.  The patient is not a candidate for oral anticholinergic therapy because of increased risk of confusion and falls.  We discussed Botox (type A and B) and 1% atropine drops.  We discusssed that candy like lemon drops can help by stimulating mm of the oropharynx to induce swallowing.   3.  Constipation             -discussed nature and pathophysiology and association with PD             -discussed importance of hydration.  Pt is to increase water intake             -pt is given a copy of the rancho recipe             -recommended daily colace             -recommended miralax prn   4.  Hx of Neurogenic Orthostatic Hypotension             -Amlodipine has been discontinued.  -discussed hydration (she is not drinking enough) and is drinking way too much mountain dew  -cannot start midodrine due to high blood pressure here today, and she admits that is often up at home.  -Discussed that she needs to use the abdominal compression binder that she was given when she is awake, if blood pressure is low.   5.  Depression  -Appreciate that primary care has been working to try to get the patient off of Xanax.  She uses this for shortness of breath, which is apparently all related to her anxiety.  Primary care has recently increased her citalopram.  -needs a counselor.  She has apparently talking "baby talk"  intermittently and we discussed that this is almost certainly stress related.  She is going to discuss referral to [facility] with Dr. Lorin Picket.  I believe that they have one in Dr. Marina Goodell office.  She has appt soon    Subjective:   Victoria Hanson was seen today in follow up for Parkinsons disease.  Patient is with her sister and daughter, who supplements history.  My previous records were reviewed prior to todays visit as well as outside records available to me. Sister with patient and supplements hx. last visit, her biggest complaints were about orthostatic hypotension.  We talked about holding her amlodipine, per her primary care notes.  We discussed hydration.  I gave her a prescription for abdominal compression binder to wear while up and awake.  She saw her primary care physician February 3 and mentioned that she felt her blood pressure was dropping.  She was orthostatic in the primary care physician office.  Her primary care reached out to me  and I asked her if she was wearing her abdominal compression binder.  She was not and we ended up reaching out to the patient and she had never even gotten the binder.  Last visit, she was complaining about head pain.  She is CT brain after that and it was normal.  Today, her BP is quite elevated and they report its been up and down.  She got the binder.  She has gone back to the dosing of the carbidopa/levodopa to the 2/1/1/2 and that helped but she notes some shuffling intermittently.  She asks about DBS surgery.  Family mentions that she is talking "baby talk."  States that Dr. Lorin Picket recommended a therapist but she hasn't gone.  Admits to depression.    Current movement disorder medications: Carbidopa/levodopa 25/100 CR  2 tablets at 7 AM, 1 tablet at 11 AM, 1 tablet at 4 PM and 2 tablets at bedtime  PREVIOUS MEDICATIONS:  ropinirole (took 1 day and said made sleepy, but levodopa was also started the same day; retried several years later at 0.25 mg  and they called a week later and stated caused low BP but already had dx of noh); pramipexole (unclear if took it; 1 note stated that she did not take it and another note stated that she had hallucinations with it - pt does recall hallucinations with "one medication"); midodrine (given but didn't take it) ; rotigotine, 2 mg daily  ALLERGIES:   Allergies  Allergen Reactions   Macrobid [Nitrofurantoin Macrocrystal] Shortness Of Breath and Palpitations   Antihistamines, Diphenhydramine-Type Other (See Comments)    She does not know which antihistamine she has an allergic reaction to.   Aspirin Other (See Comments)    Makes her sick on the stomach if she takes too many and causes bleeding.  Of note, she can take ibuprofen. Other reaction(s): Other (See Comments) Makes her sick on the stomach.  Of note, she can take ibuprofen. Makes her sick on the stomach.  Of note, she can take ibuprofen.   Ceftin [Cefuroxime Axetil] Other (See Comments)    Chest pains   Celecoxib Other (See Comments)    Reaction: Blood Disorder GI bleeding.   Cymbalta [Duloxetine Hcl] Other (See Comments)    Reaction: unknown   Diphenhydramine Other (See Comments)   Escitalopram Other (See Comments)   Lexapro [Escitalopram Oxalate] Other (See Comments)    Reaction: Difficulty with speech   Metronidazole Other (See Comments)    Other reaction(s): Unknown   Nitrofurantoin Other (See Comments)   Zoloft [Sertraline Hcl]    Ciprofloxacin Other (See Comments)    Reaction: unknown    CURRENT MEDICATIONS:  Current Meds  Medication Sig   albuterol (VENTOLIN HFA) 108 (90 Base) MCG/ACT inhaler INHALE 2 INHALATIONS BY MOUTH INTO THE LUNGS EVERY 6 HOURS AS NEEDED FOR WHEEZING   ALPRAZolam (XANAX) 0.5 MG tablet TAKE 1/2 TABLET BY MOUTH ONCE DAILY AS NEEDED   AMBULATORY NON FORMULARY MEDICATION Abdominal compression binder   aspirin EC 81 MG tablet Take 81 mg by mouth as needed.    Carbidopa-Levodopa ER (SINEMET CR) 25-100 MG  tablet controlled release Take 2 tablets at 7 AM, 1 tablet at 11 AM, 1 tablet at 4 PM and 2 tablets at bedtime .   cholecalciferol (VITAMIN D3) 25 MCG (1000 UNIT) tablet Take 1,000 Units by mouth daily.   citalopram (CELEXA) 20 MG tablet TAKE 1 TABLET BY MOUTH DAILY   fluticasone-salmeterol (ADVAIR HFA) 115-21 MCG/ACT inhaler INHALE 2 INHALATIONS INTO  THE LUNGS EVERY 12 HOURS   montelukast (SINGULAIR) 10 MG tablet TAKE 1 TABLET BY MOUTH AT BEDTIME   Spacer/Aero-Holding Chambers DEVI 1 Units by Does not apply route daily.   traZODone (DESYREL) 50 MG tablet TAKE 2 TABLETS BY MOUTH AT BEDTIME   vitamin B-12 (CYANOCOBALAMIN) 100 MCG tablet Take 100 mcg by mouth daily.   vitamin E 180 MG (400 UNITS) capsule Take 400 Units by mouth daily.     Objective:   PHYSICAL EXAMINATION:    VITALS:   Vitals:   08/07/23 1054  BP: (!) 180/90  Pulse: 62  SpO2: 99%  Weight: 180 lb 9.6 oz (81.9 kg)  Height: 5\' 3"  (1.6 m)     GEN:  The patient appears stated age and is in NAD. HEENT:  Normocephalic, atraumatic.  The mucous membranes are moist. The superficial temporal arteries are without ropiness or tenderness. Cardiovascular: Regular rate rhythm Lungs: Clear to auscultation bilaterally  Neurological examination:  Orientation: The patient is alert and oriented x3. Cranial nerves: There is good facial symmetry with mild facial hypomimia. The speech is fluent and clear. Soft palate rises symmetrically and there is no tongue deviation. Hearing is intact to conversational tone. Sensation: Sensation is intact to light touch throughout Motor: Strength is at least antigravity x4.  Movement examination: Tone: There is mild to moderate increased tone in the right upper extremity (she was due for medication Abnormal movements: No dyskinesia today Coordination:  There is mild decremation today, right greater than left. Gait and Station: The patient pushes off to arise.  She is very uncomfortable today and  does not walk far because of knee pain and because of the fact that she had a Band-Aid on the left ankle and her shoe was rubbing on the Band-Aid and making her uncomfortable.  Therefore, her walk was antalgic and slow.  I have reviewed and interpreted the following labs independently    Chemistry      Component Value Date/Time   NA 134 (L) 07/15/2023 1700   NA 140 02/17/2021 1029   NA 134 (L) 09/01/2014 1359   K 3.8 07/15/2023 1700   K 4.1 09/01/2014 1359   CL 99 07/15/2023 1700   CL 103 09/01/2014 1359   CO2 25 07/15/2023 1700   CO2 27 09/01/2014 1359   BUN 16 07/15/2023 1700   BUN 18 02/17/2021 1029   BUN 23 (H) 09/01/2014 1359   CREATININE 1.18 07/15/2023 1700   CREATININE 1.16 (H) 01/14/2017 1217      Component Value Date/Time   CALCIUM 8.6 07/15/2023 1700   CALCIUM 8.8 (L) 09/01/2014 1359   ALKPHOS 71 03/14/2023 1526   ALKPHOS 85 09/01/2014 1359   AST 12 03/14/2023 1526   AST 16 09/01/2014 1359   ALT 2 03/14/2023 1526   ALT 9 (L) 09/01/2014 1359   BILITOT 0.3 03/14/2023 1526   BILITOT 0.4 09/01/2014 1359       Lab Results  Component Value Date   WBC 8.5 07/15/2023   HGB 12.3 07/15/2023   HCT 37.1 07/15/2023   MCV 88.8 07/15/2023   PLT 248.0 07/15/2023    Lab Results  Component Value Date   TSH 1.80 04/16/2023     Total time spent on today's visit was 44 minutes, including both face-to-face time and nonface-to-face time.  Time included that spent on review of records (prior notes available to me/labs/imaging if pertinent), discussing treatment and goals, answering patient's questions and coordinating care.  Cc:  Dale Amenia,  MD

## 2023-08-06 ENCOUNTER — Ambulatory Visit: Payer: 59 | Admitting: Neurology

## 2023-08-07 ENCOUNTER — Ambulatory Visit (INDEPENDENT_AMBULATORY_CARE_PROVIDER_SITE_OTHER): Payer: 59 | Admitting: Neurology

## 2023-08-07 ENCOUNTER — Encounter: Payer: Self-pay | Admitting: Neurology

## 2023-08-07 VITALS — BP 180/90 | HR 62 | Ht 63.0 in | Wt 180.6 lb

## 2023-08-07 DIAGNOSIS — F339 Major depressive disorder, recurrent, unspecified: Secondary | ICD-10-CM

## 2023-08-07 DIAGNOSIS — G20A1 Parkinson's disease without dyskinesia, without mention of fluctuations: Secondary | ICD-10-CM | POA: Diagnosis not present

## 2023-08-07 DIAGNOSIS — G20B1 Parkinson's disease with dyskinesia, without mention of fluctuations: Secondary | ICD-10-CM | POA: Diagnosis not present

## 2023-08-07 DIAGNOSIS — G903 Multi-system degeneration of the autonomic nervous system: Secondary | ICD-10-CM

## 2023-08-07 MED ORDER — CARBIDOPA-LEVODOPA ER 25-100 MG PO TBCR: EXTENDED_RELEASE_TABLET | ORAL | 1 refills | Status: DC

## 2023-08-07 NOTE — Patient Instructions (Signed)
 Increase carbidopa/levodopa 25/100 CR, 2 at 7am, 2 at 11am, 1 at 3pm, 2 at 7pm  The physicians and staff at [facility] are committed to providing excellent care. You may receive a survey requesting feedback about your experience at our office. We strive to receive "very good" responses to the survey questions. If you feel that your experience would prevent you from giving the office a "very good " response, please contact our office to try to remedy the situation. We may be reached at [phone]. Thank you for taking the time out of your busy day to complete the survey.

## 2023-08-08 ENCOUNTER — Ambulatory Visit: Payer: Self-pay | Admitting: Internal Medicine

## 2023-08-08 ENCOUNTER — Other Ambulatory Visit (INDEPENDENT_AMBULATORY_CARE_PROVIDER_SITE_OTHER): Payer: 59

## 2023-08-08 DIAGNOSIS — E7800 Pure hypercholesterolemia, unspecified: Secondary | ICD-10-CM | POA: Diagnosis not present

## 2023-08-08 DIAGNOSIS — R739 Hyperglycemia, unspecified: Secondary | ICD-10-CM | POA: Diagnosis not present

## 2023-08-08 LAB — HEPATIC FUNCTION PANEL
ALT: 3 U/L (ref 0–35)
AST: 12 U/L (ref 0–37)
Albumin: 3.6 g/dL (ref 3.5–5.2)
Alkaline Phosphatase: 73 U/L (ref 39–117)
Bilirubin, Direct: 0.1 mg/dL (ref 0.0–0.3)
Total Bilirubin: 0.3 mg/dL (ref 0.2–1.2)
Total Protein: 6.5 g/dL (ref 6.0–8.3)

## 2023-08-08 LAB — LIPID PANEL
Cholesterol: 172 mg/dL (ref 0–200)
HDL: 63.4 mg/dL (ref >39.00)
LDL Cholesterol: 97 mg/dL (ref 0–99)
NonHDL: 109.02
Total CHOL/HDL Ratio: 3
Triglycerides: 59 mg/dL (ref 0.0–149.0)
VLDL: 11.8 mg/dL (ref 0.0–40.0)

## 2023-08-08 LAB — BASIC METABOLIC PANEL
BUN: 23 mg/dL (ref 6–23)
CO2: 28 meq/L (ref 19–32)
Calcium: 8.9 mg/dL (ref 8.4–10.5)
Chloride: 105 meq/L (ref 96–112)
Creatinine, Ser: 0.99 mg/dL (ref 0.40–1.20)
GFR: 56.48 mL/min — ABNORMAL LOW (ref >60.00)
Glucose, Bld: 87 mg/dL (ref 70–99)
Potassium: 4.5 meq/L (ref 3.5–5.1)
Sodium: 139 meq/L (ref 135–145)

## 2023-08-08 LAB — HEMOGLOBIN A1C: Hgb A1c MFr Bld: 5.9 % (ref 4.6–6.5)

## 2023-08-08 NOTE — Telephone Encounter (Signed)
 Agree with evaluation today or asap. Make her aware of [facility] walk in. They have extended hours and weekend hours - in case getting a ride is the problem.

## 2023-08-08 NOTE — Telephone Encounter (Signed)
  Chief Complaint: L leg and ankle pain Symptoms: pain, redness, bruising Frequency: 4 days Pertinent Negatives: Patient denies dizziness, fever Disposition: [] ED /[] Urgent Care (no appt availability in office) / [] Appointment(In office/virtual)/ []  [facility]/ [] Home Care/ [] Refused Recommended Disposition /[] [facility]/ [x]  Follow-up with PCP Additional Notes: Patient calls reporting a fall 4 days ago while climbing steps to go in her home. States she hurt her L ankle and leg, states it is red and painful. Patient is inquiring if she needs an antibiotic. States it was looked at by specialist yesterday during a visit for another concern. This RN offered patient visit with alt provider today at 1040, patient declined and states she has another appt that would be conflicting. Patient is inquiring if someone can look at it while she is in the clinic for labs this morning. Alerting PCP for review and follow up.   Copied from CRM [id number]. Topic: Appointments - Appointment Scheduling >> Aug 08, 2023  7:48 AM [name] wrote: Patient fell few days ago, left foot hit porch door and is swollen and the leg as well pain level is a 6 Reason for Disposition  [1] Caller has URGENT question AND [2] triager unable to answer question  Answer Assessment - Initial Assessment Questions 1. MECHANISM: "How did the fall happen?"     Thinks here legs did not have good feeling and fell going in the back door, fell stepping up. States L foot/leg was caught between the door and concrete. 2. DOMESTIC VIOLENCE AND ELDER ABUSE SCREENING: "Did you fall because someone pushed you or tried to hurt you?" If Yes, ask: "Are you safe now?"     Denies 3. ONSET: "When did the fall happen?" (e.g., minutes, hours, or days ago)     4 days ago 4. LOCATION: "What part of the body hit the ground?" (e.g., back, buttocks, head, hips, knees, hands, head, stomach)     Cannot describe- states fell forward 5.  INJURY: "Did you hurt (injure) yourself when you fell?" If Yes, ask: "What did you injure? Tell me more about this?" (e.g., body area; type of injury; pain severity)"     L foot and leg, bruise and red. Open wound above ankle 6. PAIN: "Is there any pain?" If Yes, ask: "How bad is the pain?" (e.g., Scale 1-10; or mild,  moderate, severe)   - NONE (0): No pain   - MILD (1-3): Doesn't interfere with normal activities    - MODERATE (4-7): Interferes with normal activities or awakens from sleep    - SEVERE (8-10): Excruciating pain, unable to do any normal activities      Moderate, but is taking OTC medications 7. SIZE: For cuts, bruises, or swelling, ask: "How large is it?" (e.g., inches or centimeters)      Blood blister 8. PREGNANCY: "Is there any chance you are pregnant?" "When was your last menstrual period?"     NA 9. OTHER SYMPTOMS: "Do you have any other symptoms?" (e.g., dizziness, fever, weakness; new onset or worsening).      Red on leg 10. CAUSE: "What do you think caused the fall (or falling)?" (e.g., tripped, dizzy spell)       Carrying too much  Protocols used: Falls and [facility]

## 2023-08-08 NOTE — Telephone Encounter (Signed)
 FYI   Called patient because she did not stop at check out and make appt with Charanpreet. Patient stated she fell about 4 or 5 days ago. Hurt her leg. She says she is using vaseline to keep it from sticking to bandage. Advised patient that she does not need to wait to have leg looked at. Patient stated she does not have time to come to the doctor today or tomorrow. She has an appoointment on Monday. Advised patient if develops any new symptoms, needs to go to urgent care. Pt declined several times for earlier evaluation.

## 2023-08-08 NOTE — Telephone Encounter (Signed)
 LM for patient

## 2023-08-08 NOTE — Telephone Encounter (Signed)
 Pt was in the lab this morning & wanted someone to look at her leg. I advised her to stop at the desk to schedule an appt today with Evelene Croon. She had a 1:00 appt.

## 2023-08-09 DIAGNOSIS — M1711 Unilateral primary osteoarthritis, right knee: Secondary | ICD-10-CM | POA: Diagnosis not present

## 2023-08-12 ENCOUNTER — Ambulatory Visit (INDEPENDENT_AMBULATORY_CARE_PROVIDER_SITE_OTHER): Payer: 59 | Admitting: Internal Medicine

## 2023-08-12 VITALS — BP 120/70 | HR 74 | Temp 97.9°F | Resp 16 | Ht 63.0 in | Wt 178.0 lb

## 2023-08-12 DIAGNOSIS — I779 Disorder of arteries and arterioles, unspecified: Secondary | ICD-10-CM

## 2023-08-12 DIAGNOSIS — N183 Chronic kidney disease, stage 3 unspecified: Secondary | ICD-10-CM | POA: Diagnosis not present

## 2023-08-12 DIAGNOSIS — G903 Multi-system degeneration of the autonomic nervous system: Secondary | ICD-10-CM

## 2023-08-12 DIAGNOSIS — M7989 Other specified soft tissue disorders: Secondary | ICD-10-CM

## 2023-08-12 DIAGNOSIS — S81802A Unspecified open wound, left lower leg, initial encounter: Secondary | ICD-10-CM

## 2023-08-12 DIAGNOSIS — I700 Atherosclerosis of aorta: Secondary | ICD-10-CM

## 2023-08-12 DIAGNOSIS — E7800 Pure hypercholesterolemia, unspecified: Secondary | ICD-10-CM

## 2023-08-12 DIAGNOSIS — G20A1 Parkinson's disease without dyskinesia, without mention of fluctuations: Secondary | ICD-10-CM

## 2023-08-12 DIAGNOSIS — F320 Major depressive disorder, single episode, mild: Secondary | ICD-10-CM

## 2023-08-12 MED ORDER — DOXYCYCLINE HYCLATE 100 MG PO TABS: 100.0000 mg | ORAL_TABLET | Freq: Two times a day (BID) | ORAL | 0 refills | Status: DC

## 2023-08-12 NOTE — Telephone Encounter (Signed)
Has appt this PM

## 2023-08-12 NOTE — Progress Notes (Signed)
 Subjective:    Patient ID: Victoria Hanson, female    DOB: 1950-11-25, 73 y.o.   MRN: [id number]  Patient here for  Chief Complaint  Patient presents with   Medical Management of Chronic Issues    HPI Here for a scheduled follow up - f/u regarding her blood pressure, parkinsons and increased stress/anxiety. Per triage note, had a fall 08/04/23. Hurt her left ankle. Saw Dr Audelia Acton - ortho - 08/09/23 - f/u knee arthritis. Found to have superficial blood blister over the anterior shin. Dressing applied. Recommended hyaluronic acid gel injections for continued knee issues. Abx for skin lesion. There was a "blood blister". She stuck a needle in the blister. Comes in today with increased redness surrounding the area.  Increased pedal and lower extremity swelling. Saw Dr Tat 08/07/23 - increased sinemet. Referred to PT/OT. Discussed using compression binder for orthostatic hypotension. She reports she is feeling better. No problems currently with orthostatic hypotension. I discussed with her regarding wearing her compression binder. Breathing stable.    Past Medical History:  Diagnosis Date   Anxiety    Asthma    Atypical chest pain    a. 2003 Cath: reportedly nl per pt [name]);  b. 04/2015 [facility] Admission, neg troponin.   Colon cancer (HCC) 1999   a. 1999: Pt had partial colectomy. Did develop lung metastasis. Had right upper lobectomy in 01/2007 then had associated chemotherapy.   COPD (chronic obstructive pulmonary disease) (HCC)    DDD (degenerative disc disease), lumbosacral    Depression    Echocardiogram abnormal    a. 02/2010 Echo: EF 55-60%, mild LAE, no regional wall motion abnormalities   Elevated transaminase level    a. ? NASH;  b. 06/2015 - nl LFTs.   GERD (gastroesophageal reflux disease)    Hematuria    Holter monitor, abnormal    a. 12/2009 Holter monitoring: NSR, rare PACs and PVCs.   Hypercholesterolemia    Hyperlipidemia    Hypertension    Leaky heart valve    a. Per  pt report - no evidence of valvular abnormalities on prior echoes.   Lesion of skin of Right breast 10/20/2017   Lung metastases 2008   a. S/P resection (lobectomy - right)   Morbid obesity (HCC)    Nephrolithiasis    pt denies   Parkinson disease (HCC) 08/2016   [name]   Past Surgical History:  Procedure Laterality Date   BREAST EXCISIONAL BIOPSY Right 10/07/2009   benign   CARDIAC CATHETERIZATION  2003   negative as per pt report   CHOLECYSTECTOMY  2000   COLON SURGERY     Colon cancer, removed part of large colon   COLONOSCOPY N/A 04/25/2015   Procedure: COLONOSCOPY;  Surgeon: Scot Jun, MD;  Location: [facility] ENDOSCOPY;  Service: Endoscopy;  Laterality: N/A;   COLONOSCOPY WITH PROPOFOL N/A 06/14/2017   Procedure: COLONOSCOPY WITH PROPOFOL;  Surgeon: Scot Jun, MD;  Location: [facility] ENDOSCOPY;  Service: Endoscopy;  Laterality: N/A;   COLONOSCOPY WITH PROPOFOL N/A 11/17/2019   Procedure: COLONOSCOPY WITH PROPOFOL;  Surgeon: Wyline Mood, MD;  Location: [facility] ENDOSCOPY;  Service: Gastroenterology;  Laterality: N/A;   COLONOSCOPY WITH PROPOFOL N/A 03/21/2021   Procedure: COLONOSCOPY WITH PROPOFOL;  Surgeon: Regis Bill, MD;  Location: [facility] ENDOSCOPY;  Service: Endoscopy;  Laterality: N/A;   ESOPHAGOGASTRODUODENOSCOPY (EGD) WITH PROPOFOL N/A 03/21/2021   Procedure: ESOPHAGOGASTRODUODENOSCOPY (EGD) WITH PROPOFOL;  Surgeon: Regis Bill, MD;  Location: [facility] ENDOSCOPY;  Service: Endoscopy;  Laterality:  N/A;   LOBECTOMY  01/2007   Right lung, upper lobe right side removed   REPLACEMENT TOTAL KNEE Left 08/2020   TOTAL ABDOMINAL HYSTERECTOMY     abnormal bleeding   TUBAL LIGATION  1980   Family History  Problem Relation Age of Onset   Coronary artery disease Mother        died @ 29 of MI.   Mental illness Mother    Diabetes Mother    Heart attack Father 20       MI   Cancer Father        lung - died in his mid-70's   Hypertension Father    Diabetes  Father    Sudden death Brother    Mental illness Brother    Heart attack Brother        MI @ 1, died @ age 5.   Hypertension Child    Breast cancer Neg Hx    Social History   Socioeconomic History   Marital status: Widowed    Spouse name: Not on file   Number of children: Not on file   Years of education: Not on file   Highest education level: Not on file  Occupational History   Not on file  Tobacco Use   Smoking status: Never    Passive exposure: Yes   Smokeless tobacco: Never   Tobacco comments:    husband and son smoked in her home.  Vaping Use   Vaping status: Never Used  Substance and Sexual Activity   Alcohol use: Not Currently    Alcohol/week: 0.0 standard drinks of alcohol   Drug use: No   Sexual activity: Never  Other Topics Concern   Not on file  Social History Narrative   Widowed.   Disabled (colon/lung CA), retired.   Lives in [place] with her dtr, grand-dtr, and son.   Active.   Gets regular exercise, walks.   Right handed    Social Drivers of Health   Financial Resource Strain: Low Risk  (01/07/2023)   Overall Financial Resource Strain (CARDIA)    Difficulty of Paying Living Expenses: Not hard at all  Food Insecurity: No Food Insecurity (01/07/2023)   Hunger Vital Sign    Worried About Running Out of Food in the Last Year: Never true    Ran Out of Food in the Last Year: Never true  Transportation Needs: No Transportation Needs (01/07/2023)   PRAPARE - Administrator, Civil Service (Medical): No    Lack of Transportation (Non-Medical): No  Physical Activity: Inactive (01/07/2023)   Exercise Vital Sign    Days of Exercise per Week: 0 days    Minutes of Exercise per Session: 0 min  Stress: Stress Concern Present (01/07/2023)   [employer] of Occupational Health - Occupational Stress Questionnaire    Feeling of Stress : Rather much  Social Connections: Moderately Integrated (01/07/2023)   Social Connection and Isolation Panel  [NHANES]    Frequency of Communication with Friends and Family: More than three times a week    Frequency of Social Gatherings with Friends and Family: More than three times a week    Attends Religious Services: More than 4 times per year    Active Member of [employer] or Organizations: Yes    Attends Banker Meetings: More than 4 times per year    Marital Status: Widowed     Review of Systems  Constitutional:  Negative for appetite change and unexpected weight  change.  HENT:  Negative for congestion and sinus pressure.   Respiratory:  Negative for cough, chest tightness and shortness of breath.   Cardiovascular:  Negative for chest pain and palpitations.       Lower extremity swelling - left lower extremity swelling > right.   Gastrointestinal:  Negative for abdominal pain, diarrhea, nausea and vomiting.  Genitourinary:  Negative for difficulty urinating and dysuria.  Musculoskeletal:  Negative for joint swelling and myalgias.  Skin:  Negative for rash.       Wound - left anterior shin. Surrounding erythema.   Neurological:  Negative for dizziness and headaches.  Psychiatric/Behavioral:  Negative for agitation and dysphoric mood.        Objective:     BP 120/70   Pulse 74   Temp 97.9 F (36.6 C)   Resp 16   Ht 5\' 3"  (1.6 m)   Wt 178 lb (80.7 kg)   SpO2 97%   BMI 31.53 kg/m  Wt Readings from Last 3 Encounters:  08/17/23 178 lb (80.7 kg)  08/12/23 178 lb (80.7 kg)  08/07/23 180 lb 9.6 oz (81.9 kg)    Physical Exam Vitals reviewed.  Constitutional:      General: She is not in acute distress.    Appearance: Normal appearance.  HENT:     Head: Normocephalic and atraumatic.     Right Ear: External ear normal.     Left Ear: External ear normal.     Mouth/Throat:     Pharynx: No oropharyngeal exudate or posterior oropharyngeal erythema.  Eyes:     General: No scleral icterus.       Right eye: No discharge.        Left eye: No discharge.      Conjunctiva/sclera: Conjunctivae normal.  Neck:     Thyroid: No thyromegaly.  Cardiovascular:     Rate and Rhythm: Normal rate and regular rhythm.  Pulmonary:     Effort: No respiratory distress.     Breath sounds: Normal breath sounds. No wheezing.  Abdominal:     General: Bowel sounds are normal.     Palpations: Abdomen is soft.     Tenderness: There is no abdominal tenderness.  Musculoskeletal:        General: No tenderness.     Cervical back: Neck supple. No tenderness.     Comments: Lower extremity swelling - Left > right.   Lymphadenopathy:     Cervical: No cervical adenopathy.  Skin:    Findings: No rash.     Comments: Wound - left anterior shin. Surrounding erythema.   Neurological:     Mental Status: She is alert.  Psychiatric:        Mood and Affect: Mood normal.        Behavior: Behavior normal.         Outpatient Encounter Medications as of 08/12/2023  Medication Sig   doxycycline (VIBRA-TABS) 100 MG tablet Take 1 tablet (100 mg total) by mouth 2 (two) times daily.   albuterol (VENTOLIN HFA) 108 (90 Base) MCG/ACT inhaler INHALE 2 INHALATIONS BY MOUTH INTO THE LUNGS EVERY 6 HOURS AS NEEDED FOR WHEEZING   ALPRAZolam (XANAX) 0.5 MG tablet TAKE 1/2 TABLET BY MOUTH ONCE DAILY AS NEEDED   AMBULATORY NON FORMULARY MEDICATION Abdominal compression binder   aspirin EC 81 MG tablet Take 81 mg by mouth as needed.    Carbidopa-Levodopa ER (SINEMET CR) 25-100 MG tablet controlled release Take 2 at 7 AM, 2 at 11  AM, 1 at 4 PM and 2 tablets at bedtime .   cholecalciferol (VITAMIN D3) 25 MCG (1000 UNIT) tablet Take 1,000 Units by mouth daily.   citalopram (CELEXA) 20 MG tablet TAKE 1 TABLET BY MOUTH DAILY   fluticasone-salmeterol (ADVAIR HFA) 115-21 MCG/ACT inhaler INHALE 2 INHALATIONS INTO THE LUNGS EVERY 12 HOURS   montelukast (SINGULAIR) 10 MG tablet TAKE 1 TABLET BY MOUTH AT BEDTIME   Spacer/Aero-Holding Chambers DEVI 1 Units by Does not apply route daily.   traZODone  (DESYREL) 50 MG tablet TAKE 2 TABLETS BY MOUTH AT BEDTIME   vitamin B-12 (CYANOCOBALAMIN) 100 MCG tablet Take 100 mcg by mouth daily.   vitamin E 180 MG (400 UNITS) capsule Take 400 Units by mouth daily.   [DISCONTINUED] diclofenac Sodium (VOLTAREN ARTHRITIS PAIN) 1 % GEL Apply topically 4 (four) times daily. (Patient not taking: Reported on 08/07/2023)   No facility-administered encounter medications on file as of 08/12/2023.     Lab Results  Component Value Date   WBC 8.5 07/15/2023   HGB 12.3 07/15/2023   HCT 37.1 07/15/2023   PLT 248.0 07/15/2023   GLUCOSE 87 08/08/2023   CHOL 172 08/08/2023   TRIG 59.0 08/08/2023   HDL 63.40 08/08/2023   LDLDIRECT 163.4 12/31/2012   LDLCALC 97 08/08/2023   ALT 3 08/08/2023   AST 12 08/08/2023   NA 139 08/08/2023   K 4.5 08/08/2023   CL 105 08/08/2023   CREATININE 0.99 08/08/2023   BUN 23 08/08/2023   CO2 28 08/08/2023   TSH 1.80 04/16/2023   INR 1.0 12/12/2012   HGBA1C 5.9 08/08/2023    CT HEAD WO CONTRAST ( ) Result Date: 05/20/2023 CLINICAL DATA:  Swelling over the left temple and pain EXAM: CT HEAD WITHOUT CONTRAST TECHNIQUE: Contiguous axial images were obtained from the base of the skull through the vertex without intravenous contrast. RADIATION DOSE REDUCTION: This exam was performed according to the departmental dose-optimization program which includes automated exposure control, adjustment of the mA and/or kV according to patient size and/or use of iterative reconstruction technique. COMPARISON:  05/20/2022 FINDINGS: Evaluation is somewhat limited by motion artifact. Brain: No evidence of acute infarction, hemorrhage, mass, mass effect, or midline shift. No hydrocephalus or extra-axial fluid collection. Vascular: No hyperdense vessel. Skull: Negative for fracture or focal lesion. Sinuses/Orbits: No acute finding. Other: The mastoid air cells are well aerated. No abnormality is seen in the region of the left temple. IMPRESSION: No acute  intracranial process. Electronically Signed   By: Wiliam Ke M.D.   On: 05/20/2023 03:08       Assessment & Plan:  Swelling of lower extremity Assessment & Plan: Has seen [facility]. Recommended compression hose. Continue leg elevation. Unable to wear compression hose currently with lower leg wound. Discussed lower extremity duplex. Declines. Treat wound.elevate leg. Follow closely.    Pure hypercholesterolemia Assessment & Plan: Off crestor. Did not tolerate. Low cholesterol diet and exercise. Follow lipid panel and liver function tests.    Parkinson's disease, unspecified whether dyskinesia present, unspecified whether manifestations fluctuate (HCC) Assessment & Plan: Followed by Dr Arbutus Leas. Continues on sinemet. Doing better since recent adjustment in sinemet dose. Discussed using compression binder. Follow.    Orthostatic hypotension due to Parkinson's disease [facility]) Assessment & Plan: Discussed using compression binder. Overall doing better since sinemet dose adjustment. Follow.    Depression, major, single episode, mild (HCC) Assessment & Plan: Continue citalopram. Overall stable.  Follow.    Stage 3 chronic kidney disease, unspecified whether  stage 3a or 3b CKD (HCC) Assessment & Plan: Continue to avoid antiinflammatory medication. Off micardis due to low pressures. Blood pressure as outlined. Follow metabolic panel.    Bilateral carotid artery disease, unspecified type [facility]) Assessment & Plan: Off crestor. Intolerance. Blood pressure controlled.    Aortic atherosclerosis (HCC) Assessment & Plan: Off crestor.    Wound of left lower extremity, initial encounter Assessment & Plan: Left lower leg wound. Surrounding erythema. Leg elevation. Treat with doxycycline. Follow closely. Get her back in soon to reassess.    Other orders -     Doxycycline Hyclate; Take 1 tablet (100 mg total) by mouth 2 (two) times daily.  Dispense: 14 tablet; Refill: 0     [place],  MD

## 2023-08-14 ENCOUNTER — Ambulatory Visit: Payer: 59

## 2023-08-16 ENCOUNTER — Telehealth: Payer: Self-pay

## 2023-08-16 DIAGNOSIS — M1711 Unilateral primary osteoarthritis, right knee: Secondary | ICD-10-CM | POA: Diagnosis not present

## 2023-08-16 NOTE — Telephone Encounter (Signed)
 Copied from CRM [id number]. Topic: Clinical - Medical Advice >> Aug 16, 2023  9:39 AM Alcus Dad wrote: Reason for CRM: Patient stated that she is on doxycycline (VIBRA-TABS) 100 MG tablet for her foot but it is still swollen.

## 2023-08-16 NOTE — Telephone Encounter (Signed)
 LMTCB

## 2023-08-17 ENCOUNTER — Emergency Department

## 2023-08-17 ENCOUNTER — Encounter: Payer: Self-pay | Admitting: Internal Medicine

## 2023-08-17 ENCOUNTER — Emergency Department
Admission: EM | Admit: 2023-08-17 | Discharge: 2023-08-17 | Disposition: A | Discharge status: Home / Self Care | Attending: Emergency Medicine | Admitting: Emergency Medicine

## 2023-08-17 ENCOUNTER — Other Ambulatory Visit: Payer: Self-pay

## 2023-08-17 DIAGNOSIS — M7989 Other specified soft tissue disorders: Secondary | ICD-10-CM | POA: Diagnosis not present

## 2023-08-17 DIAGNOSIS — R2242 Localized swelling, mass and lump, left lower limb: Secondary | ICD-10-CM | POA: Diagnosis present

## 2023-08-17 DIAGNOSIS — R600 Localized edema: Secondary | ICD-10-CM

## 2023-08-17 DIAGNOSIS — T148XXA Other injury of unspecified body region, initial encounter: Secondary | ICD-10-CM

## 2023-08-17 DIAGNOSIS — S81812A Laceration without foreign body, left lower leg, initial encounter: Secondary | ICD-10-CM | POA: Insufficient documentation

## 2023-08-17 DIAGNOSIS — S81802A Unspecified open wound, left lower leg, initial encounter: Secondary | ICD-10-CM | POA: Diagnosis not present

## 2023-08-17 DIAGNOSIS — Z043 Encounter for examination and observation following other accident: Secondary | ICD-10-CM | POA: Diagnosis not present

## 2023-08-17 DIAGNOSIS — W19XXXA Unspecified fall, initial encounter: Secondary | ICD-10-CM | POA: Insufficient documentation

## 2023-08-17 NOTE — Assessment & Plan Note (Signed)
 Continue citalopram. Overall stable.  Follow.

## 2023-08-17 NOTE — ED Provider Notes (Signed)
 [facility] Provider Note    Event Date/Time   First MD Initiated Contact with Patient 08/17/23 1427     (approximate)   History   Leg Swelling   HPI  Victoria Hanson is a 73 y.o. female  because of concern for left leg swelling and wound. The patient states that she suffered a fall where she scraped her left shin on some concrete a couple of weeks ago. She says she has had swelling since then. She has followed up with her doctor who put her on a course of antibiotics which she is still taking. She denies any fevers or chills. Denies any nausea or vomiting.     Physical Exam   Triage Vital Signs: ED Triage Vitals  Encounter Vitals Group     BP 08/17/23 1249 (!) 118/52     Systolic BP Percentile --      Diastolic BP Percentile --      Pulse Rate 08/17/23 1249 67     Resp 08/17/23 1249 16     Temp 08/17/23 1249 98 F (36.7 C)     Temp Source 08/17/23 1249 Oral     SpO2 08/17/23 1249 98 %     Weight 08/17/23 1256 178 lb (80.7 kg)     Height 08/17/23 1256 5\' 3"  (1.6 m)     Head Circumference --      Peak Flow --      Pain Score 08/17/23 1253 10     Pain Loc --      Pain Education --      Exclude from Growth Chart --     Most recent vital signs: Vitals:   08/17/23 1249  BP: (!) 118/52  Pulse: 67  Resp: 16  Temp: 98 F (36.7 C)  SpO2: 98%   General: Awake, alert, oriented. CV:  Good peripheral perfusion. Regular rate and rhythm. Resp:  Normal effort. Lungs clear. Abd:  No distention.  Ext:  Left lower leg with edema. Wound to her anterior shin, no bad odor. No discharge. No surrounding erythema or warmth.    ED Results / Procedures / Treatments   Labs (all labs ordered are listed, but only abnormal results are displayed) Labs Reviewed - No data to display   EKG  None   RADIOLOGY I independently interpreted and visualized the left tib/fib. My interpretation: No acute osseous abnormality Radiology interpretation:   IMPRESSION:  1. Generalized soft tissue edema.  2. No acute osseous findings.  Intact knee arthroplasty.    I independently interpreted and visualized the LLE [place]. My interpretation: No DVT Radiology interpretation:  IMPRESSION:  No evidence of left lower extremity DVT.     PROCEDURES:  Critical Care performed: No   MEDICATIONS ORDERED IN ED: Medications - No data to display   IMPRESSION / MDM / ASSESSMENT AND PLAN / ED COURSE  I reviewed the triage vital signs and the nursing notes.                              Differential diagnosis includes, but is not limited to, fracture, contusion, DVT  Patient's presentation is most consistent with acute presentation with potential threat to life or bodily function.  Patient presented to the emergency department today because of concerns for continued swelling and wound to her left leg after a fall couple of weeks ago.  On exam she does have a wound to her anterior  shin although no surrounding erythema, bad odor or discharge.  This time I have low concerns for cellulitis.  Did check a x-ray which did not show any acute osseous abnormalities as well as an ultrasound which did not show any blood clots.  At this time I do think it is safe for patient be discharged.  Discussed with patient follow-up with wound care center.     FINAL CLINICAL IMPRESSION(S) / ED DIAGNOSES   Final diagnoses:  Peripheral edema  Wound of skin     Note:  This document was prepared using Dragon voice recognition software and may include unintentional dictation errors.    Phineas Semen, MD 08/17/23 [phone]

## 2023-08-17 NOTE — Assessment & Plan Note (Signed)
 Discussed using compression binder. Overall doing better since sinemet dose adjustment. Follow.

## 2023-08-17 NOTE — Assessment & Plan Note (Signed)
 Has seen [facility]. Recommended compression hose. Continue leg elevation. Unable to wear compression hose currently with lower leg wound. Discussed lower extremity duplex. Declines. Treat wound.elevate leg. Follow closely.

## 2023-08-17 NOTE — Assessment & Plan Note (Signed)
 Followed by Dr Arbutus Leas. Continues on sinemet. Doing better since recent adjustment in sinemet dose. Discussed using compression binder. Follow.

## 2023-08-17 NOTE — Assessment & Plan Note (Signed)
 Continue to avoid antiinflammatory medication. Off micardis due to low pressures. Blood pressure as outlined. Follow metabolic panel.

## 2023-08-17 NOTE — ED Triage Notes (Signed)
 Pt c/o laceration to left lower leg following a fall 8-9 days ago. Redness and swelling present to LLE since fall. Seen by PCP last Friday; she recommended an ultrasound d/t the swelling and redness but pt declined. Pt is currently on doxycycline for her leg

## 2023-08-17 NOTE — Assessment & Plan Note (Signed)
 Left lower leg wound. Surrounding erythema. Leg elevation. Treat with doxycycline. Follow closely. Get her back in soon to reassess.

## 2023-08-17 NOTE — Assessment & Plan Note (Signed)
 Off crestor

## 2023-08-17 NOTE — Assessment & Plan Note (Addendum)
 Off crestor. Did not tolerate. Low cholesterol diet and exercise. Follow lipid panel and liver function tests.

## 2023-08-17 NOTE — Assessment & Plan Note (Signed)
 Off crestor. Intolerance. Blood pressure controlled.

## 2023-08-19 ENCOUNTER — Telehealth: Payer: Self-pay

## 2023-08-19 ENCOUNTER — Ambulatory Visit: Admitting: Internal Medicine

## 2023-08-19 ENCOUNTER — Ambulatory Visit: Payer: 59

## 2023-08-19 NOTE — Telephone Encounter (Signed)
 Copied from CRM [id number]. Topic: General - Other >> Aug 19, 2023  9:13 AM Alcus Dad wrote: Reason for CRM: Patient wants Trish (Dr. Roby Lofts Nurse) to call her

## 2023-08-19 NOTE — Transitions of Care (Post Inpatient/ED Visit) (Signed)
 08/19/2023  Name: Victoria Hanson MRN: [id number] DOB: Mar 20, 1951  Today's TOC FU Call Status: Today's TOC FU Call Status:: Successful TOC FU Call Completed TOC FU Call Complete Date: 08/19/23 Patient's Name and Date of Birth confirmed.  Transition Care Management Follow-up Telephone Call Date of Discharge: 08/17/23 Type of Discharge: Emergency Department Reason for ED Visit: Other: (fall) How have you been since you were released from the hospital?: Better Any questions or concerns?: No  Items Reviewed: Did you receive and understand the discharge instructions provided?: Yes Medications obtained,verified, and reconciled?: Yes (Medications Reviewed) Any new allergies since your discharge?: No Dietary orders reviewed?: Yes Do you have support at home?: Yes People in Home: child(ren), adult  Medications Reviewed Today: Medications Reviewed Today     Reviewed by Karena Addison, LPN (Licensed Practical Nurse) on 08/19/23 at 1534  Med List Status: <None>   Medication Order Taking? Sig Documenting Provider Last Dose Status Informant  albuterol (VENTOLIN HFA) 108 (90 Base) MCG/ACT inhaler [id number] No INHALE 2 INHALATIONS BY MOUTH INTO THE LUNGS EVERY 6 HOURS AS NEEDED FOR WHEEZING Dale [place], MD Taking Active   ALPRAZolam Prudy Feeler) 0.5 MG tablet [id number] No TAKE 1/2 TABLET BY MOUTH ONCE DAILY AS NEEDED Dale [place], MD Taking Active   AMBULATORY NON FORMULARY MEDICATION [id number] No Abdominal compression binder Tat, Octaviano Batty, DO Taking Active   aspirin EC 81 MG tablet [id number] No Take 81 mg by mouth as needed.  [provider] Taking Active Multiple Informants  Carbidopa-Levodopa ER (SINEMET CR) 25-100 MG tablet controlled release [id number]  Take 2 at 7 AM, 2 at 11 AM, 1 at 4 PM and 2 tablets at bedtime . Vladimir Faster, DO  Active   cholecalciferol (VITAMIN D3) 25 MCG (1000 UNIT) tablet [id number] No Take 1,000 Units by mouth daily. [provider] Taking  Active   citalopram (CELEXA) 20 MG tablet [id number] No TAKE 1 TABLET BY MOUTH DAILY Dale [place], MD Taking Active   doxycycline (VIBRA-TABS) 100 MG tablet [id number]  Take 1 tablet (100 mg total) by mouth 2 (two) times daily. Dale [place], MD  Active   fluticasone-salmeterol (ADVAIR HFA) [phone] MCG/ACT inhaler [id number] No INHALE 2 INHALATIONS INTO THE LUNGS EVERY 12 HOURS Dale [place], MD Taking Active   montelukast (SINGULAIR) 10 MG tablet [id number] No TAKE 1 TABLET BY MOUTH AT BEDTIME Dale [place], MD Taking Active   Spacer/Aero-Holding [facility] [id number] No 1 Units by Does not apply route daily. Allegra Grana, FNP Taking Active   traZODone (DESYREL) 50 MG tablet [id number] No TAKE 2 TABLETS BY MOUTH AT BEDTIME Dale [place], MD Taking Active   vitamin B-12 (CYANOCOBALAMIN) 100 MCG tablet [id number] No Take 100 mcg by mouth daily. [provider] Taking Active   vitamin E 180 MG (400 UNITS) capsule [id number] No Take 400 Units by mouth daily. [provider] Taking Active             Home Care and Equipment/Supplies: Were Home Health Services Ordered?: NA Any new equipment or medical supplies ordered?: NA  Functional Questionnaire: Do you need assistance with bathing/showering or dressing?: No Do you need assistance with meal preparation?: No Do you need assistance with eating?: No Do you have difficulty maintaining continence: No Do you need assistance with getting out of bed/getting out of a chair/moving?: No Do you have difficulty managing or taking your medications?: No  Follow up appointments reviewed: PCP Follow-up appointment confirmed?: Yes Date of PCP follow-up appointment?: 08/23/23 Follow-up Provider:  [facility] Follow-up appointment confirmed?: Yes Date of Specialist follow-up appointment?: 10/01/23 Follow-Up Specialty Provider:: wound care Do you need transportation to your follow-up appointment?: No Do you  understand care options if your condition(s) worsen?: Yes-patient verbalized understanding    SIGNATURE Karena Addison, LPN [facility] Nurse Health Advisor Direct Dial [phone]

## 2023-08-19 NOTE — Telephone Encounter (Signed)
 Called patient and rescheduled her wound check with Dr Lorin Picket per patient request. She had to cancel her original appt due to scheduling and transportation conflict.

## 2023-08-21 ENCOUNTER — Ambulatory Visit: Payer: 59

## 2023-08-21 ENCOUNTER — Ambulatory Visit: Admitting: Internal Medicine

## 2023-08-23 ENCOUNTER — Encounter: Payer: Self-pay | Admitting: Internal Medicine

## 2023-08-23 ENCOUNTER — Telehealth: Payer: Self-pay

## 2023-08-23 ENCOUNTER — Ambulatory Visit (INDEPENDENT_AMBULATORY_CARE_PROVIDER_SITE_OTHER): Admitting: Internal Medicine

## 2023-08-23 VITALS — BP 128/70 | Temp 97.5°F | Resp 17 | Ht 63.0 in | Wt 179.5 lb

## 2023-08-23 DIAGNOSIS — G20A1 Parkinson's disease without dyskinesia, without mention of fluctuations: Secondary | ICD-10-CM | POA: Diagnosis not present

## 2023-08-23 DIAGNOSIS — R600 Localized edema: Secondary | ICD-10-CM

## 2023-08-23 DIAGNOSIS — S81802A Unspecified open wound, left lower leg, initial encounter: Secondary | ICD-10-CM | POA: Diagnosis not present

## 2023-08-23 DIAGNOSIS — I10 Essential (primary) hypertension: Secondary | ICD-10-CM | POA: Diagnosis not present

## 2023-08-23 MED ORDER — CITALOPRAM HYDROBROMIDE 20 MG PO TABS
20.0000 mg | ORAL_TABLET | Freq: Every day | ORAL | 5 refills | Status: DC
Start: 2023-08-23 — End: 2024-02-28

## 2023-08-23 NOTE — Telephone Encounter (Signed)
 Called and left message at wound center to see if she can be worked in for earlier appt.  Left message to call back.

## 2023-08-23 NOTE — Assessment & Plan Note (Signed)
 Elevate legs. Follow.

## 2023-08-23 NOTE — Assessment & Plan Note (Signed)
 Open wound - left lower leg. No increased erythema. Hold on more abx. Hold on topical ointments. Cover when up and moving. Leave open when sitting. Elevate legs. F/u at wound center.

## 2023-08-23 NOTE — Assessment & Plan Note (Signed)
 Followed by Dr Arbutus Leas. Continues on sinemet. Doing better since adjustment in her dose of sinemet.  Discussed using compression binder. Follow.

## 2023-08-23 NOTE — Progress Notes (Signed)
 Subjective:    Patient ID: Victoria Hanson, female    DOB: [birth year], 73 y.o.   MRN: [id number]  Patient here for  Chief Complaint  Patient presents with   Wound Check    Left lower leg wound-changes & cleans it 2-3 times per day with saline & bactroban.    HPI Here for follow up - wound check. Fall - 08/04/23 - hurt her left ankle. Saw ortho - 08/09/23 - f/u knee arthritis. Found to have superficial blood blister. Had f/u with me 08/12/23 - increased swelling and redness. Treated with doxycycline. S/p hyaluronic injection - right knee. Evaluated ER 08/17/23 - ultrasound - negative for DVT. Xray - no acute osseous finding. Persistent wound. No increased erythema now. Persistent lower extremity swelling. No fever. Breathing stable.    Past Medical History:  Diagnosis Date   Anxiety    Asthma    Atypical chest pain    a. 2003 Cath: reportedly nl per pt [name]);  b. 04/2015 [facility] Admission, neg troponin.   Colon cancer (HCC) 1999   a. 1999: Pt had partial colectomy. Did develop lung metastasis. Had right upper lobectomy in 01/2007 then had associated chemotherapy.   COPD (chronic obstructive pulmonary disease) (HCC)    DDD (degenerative disc disease), lumbosacral    Depression    Echocardiogram abnormal    a. 02/2010 Echo: EF 55-60%, mild LAE, no regional wall motion abnormalities   Elevated transaminase level    a. ? NASH;  b. 06/2015 - nl LFTs.   GERD (gastroesophageal reflux disease)    Hematuria    Holter monitor, abnormal    a. 12/2009 Holter monitoring: NSR, rare PACs and PVCs.   Hypercholesterolemia    Hyperlipidemia    Hypertension    Leaky heart valve    a. Per pt report - no evidence of valvular abnormalities on prior echoes.   Lesion of skin of Right breast 10/20/2017   Lung metastases 2008   a. S/P resection (lobectomy - right)   Morbid obesity (HCC)    Nephrolithiasis    pt denies   Parkinson disease (HCC) 08/2016   [name]   Past Surgical History:  Procedure  Laterality Date   BREAST EXCISIONAL BIOPSY Right 10/07/2009   benign   CARDIAC CATHETERIZATION  2003   negative as per pt report   CHOLECYSTECTOMY  2000   COLON SURGERY     Colon cancer, removed part of large colon   COLONOSCOPY N/A 04/25/2015   Procedure: COLONOSCOPY;  Surgeon: Scot Jun, MD;  Location: [facility] ENDOSCOPY;  Service: Endoscopy;  Laterality: N/A;   COLONOSCOPY WITH PROPOFOL N/A 06/14/2017   Procedure: COLONOSCOPY WITH PROPOFOL;  Surgeon: Scot Jun, MD;  Location: [facility] ENDOSCOPY;  Service: Endoscopy;  Laterality: N/A;   COLONOSCOPY WITH PROPOFOL N/A 11/17/2019   Procedure: COLONOSCOPY WITH PROPOFOL;  Surgeon: Wyline Mood, MD;  Location: [facility] ENDOSCOPY;  Service: Gastroenterology;  Laterality: N/A;   COLONOSCOPY WITH PROPOFOL N/A 03/21/2021   Procedure: COLONOSCOPY WITH PROPOFOL;  Surgeon: Regis Bill, MD;  Location: [facility] ENDOSCOPY;  Service: Endoscopy;  Laterality: N/A;   ESOPHAGOGASTRODUODENOSCOPY (EGD) WITH PROPOFOL N/A 03/21/2021   Procedure: ESOPHAGOGASTRODUODENOSCOPY (EGD) WITH PROPOFOL;  Surgeon: Regis Bill, MD;  Location: [facility] ENDOSCOPY;  Service: Endoscopy;  Laterality: N/A;   LOBECTOMY  01/2007   Right lung, upper lobe right side removed   REPLACEMENT TOTAL KNEE Left 08/2020   TOTAL ABDOMINAL HYSTERECTOMY     abnormal bleeding   TUBAL LIGATION  1980  Family History  Problem Relation Age of Onset   Coronary artery disease Mother        died @ 39 of MI.   Mental illness Mother    Diabetes Mother    Heart attack Father 60       MI   Cancer Father        lung - died in his mid-70's   Hypertension Father    Diabetes Father    Sudden death Brother    Mental illness Brother    Heart attack Brother        MI @ 92, died @ age 107.   Hypertension Child    Breast cancer Neg Hx    Social History   Socioeconomic History   Marital status: Widowed    Spouse name: Not on file   Number of children: Not on file   Years of education:  Not on file   Highest education level: Not on file  Occupational History   Not on file  Tobacco Use   Smoking status: Never    Passive exposure: Yes   Smokeless tobacco: Never   Tobacco comments:    husband and son smoked in her home.  Vaping Use   Vaping status: Never Used  Substance and Sexual Activity   Alcohol use: Not Currently    Alcohol/week: 0.0 standard drinks of alcohol   Drug use: No   Sexual activity: Never  Other Topics Concern   Not on file  Social History Narrative   Widowed.   Disabled (colon/lung CA), retired.   Lives in [place] with her dtr, grand-dtr, and son.   Active.   Gets regular exercise, walks.   Right handed    Social Drivers of Health   Financial Resource Strain: Low Risk  (01/07/2023)   Overall Financial Resource Strain (CARDIA)    Difficulty of Paying Living Expenses: Not hard at all  Food Insecurity: No Food Insecurity (01/07/2023)   Hunger Vital Sign    Worried About Running Out of Food in the Last Year: Never true    Ran Out of Food in the Last Year: Never true  Transportation Needs: No Transportation Needs (01/07/2023)   PRAPARE - Administrator, Civil Service (Medical): No    Lack of Transportation (Non-Medical): No  Physical Activity: Inactive (01/07/2023)   Exercise Vital Sign    Days of Exercise per Week: 0 days    Minutes of Exercise per Session: 0 min  Stress: Stress Concern Present (01/07/2023)   [employer] of Occupational Health - Occupational Stress Questionnaire    Feeling of Stress : Rather much  Social Connections: Moderately Integrated (01/07/2023)   Social Connection and Isolation Panel [NHANES]    Frequency of Communication with Friends and Family: More than three times a week    Frequency of Social Gatherings with Friends and Family: More than three times a week    Attends Religious Services: More than 4 times per year    Active Member of [employer] or Organizations: Yes    Attends Tax inspector Meetings: More than 4 times per year    Marital Status: Widowed     Review of Systems  Constitutional:  Negative for appetite change and unexpected weight change.  HENT:  Negative for congestion and sinus pressure.   Respiratory:  Negative for cough, chest tightness and shortness of breath.   Cardiovascular:  Positive for leg swelling. Negative for chest pain and palpitations.  Gastrointestinal:  Negative  for abdominal pain, diarrhea, nausea and vomiting.  Genitourinary:  Negative for difficulty urinating and dysuria.  Musculoskeletal:  Negative for joint swelling and myalgias.  Skin:  Negative for color change and rash.       Open wound = left lower extremity. No surrounding erythema.   Neurological:  Negative for dizziness and headaches.  Psychiatric/Behavioral:  Negative for agitation and dysphoric mood.        Objective:     BP 128/70   Temp (!) 97.5 F (36.4 C) (Oral)   Resp 17   Ht 5\' 3"  (1.6 m)   Wt 179 lb 8 oz (81.4 kg)   SpO2 97%   BMI 31.80 kg/m  Wt Readings from Last 3 Encounters:  08/23/23 179 lb 8 oz (81.4 kg)  08/17/23 178 lb (80.7 kg)  08/12/23 178 lb (80.7 kg)    Physical Exam Vitals reviewed.  Constitutional:      General: She is not in acute distress.    Appearance: Normal appearance.  HENT:     Head: Normocephalic and atraumatic.     Right Ear: External ear normal.     Left Ear: External ear normal.  Eyes:     General: No scleral icterus.       Right eye: No discharge.        Left eye: No discharge.     Conjunctiva/sclera: Conjunctivae normal.  Neck:     Thyroid: No thyromegaly.  Cardiovascular:     Rate and Rhythm: Normal rate and regular rhythm.  Pulmonary:     Effort: No respiratory distress.     Breath sounds: Normal breath sounds. No wheezing.  Abdominal:     General: Bowel sounds are normal.     Palpations: Abdomen is soft.     Tenderness: There is no abdominal tenderness.  Musculoskeletal:        General: No  swelling or tenderness.     Cervical back: Neck supple. No tenderness.  Lymphadenopathy:     Cervical: No cervical adenopathy.  Skin:    Comments: Open wound anterior shin - left lower leg. No surrounding erythema.   Neurological:     Mental Status: She is alert.  Psychiatric:        Mood and Affect: Mood normal.        Behavior: Behavior normal.         Outpatient Encounter Medications as of 08/23/2023  Medication Sig   albuterol (VENTOLIN HFA) 108 (90 Base) MCG/ACT inhaler INHALE 2 INHALATIONS BY MOUTH INTO THE LUNGS EVERY 6 HOURS AS NEEDED FOR WHEEZING   ALPRAZolam (XANAX) 0.5 MG tablet TAKE 1/2 TABLET BY MOUTH ONCE DAILY AS NEEDED   AMBULATORY NON FORMULARY MEDICATION Abdominal compression binder   aspirin EC 81 MG tablet Take 81 mg by mouth as needed.    Carbidopa-Levodopa ER (SINEMET CR) 25-100 MG tablet controlled release Take 2 at 7 AM, 2 at 11 AM, 1 at 4 PM and 2 tablets at bedtime .   cholecalciferol (VITAMIN D3) 25 MCG (1000 UNIT) tablet Take 1,000 Units by mouth daily.   fluticasone-salmeterol (ADVAIR HFA) 115-21 MCG/ACT inhaler INHALE 2 INHALATIONS INTO THE LUNGS EVERY 12 HOURS   montelukast (SINGULAIR) 10 MG tablet TAKE 1 TABLET BY MOUTH AT BEDTIME   Spacer/Aero-Holding [place] DEVI 1 Units by Does not apply route daily.   traZODone (DESYREL) 50 MG tablet TAKE 2 TABLETS BY MOUTH AT BEDTIME   vitamin B-12 (CYANOCOBALAMIN) 100 MCG tablet Take 100 mcg by mouth daily.  vitamin E 180 MG (400 UNITS) capsule Take 400 Units by mouth daily.   citalopram (CELEXA) 20 MG tablet Take 1 tablet (20 mg total) by mouth daily.   [DISCONTINUED] citalopram (CELEXA) 20 MG tablet TAKE 1 TABLET BY MOUTH DAILY   [DISCONTINUED] doxycycline (VIBRA-TABS) 100 MG tablet Take 1 tablet (100 mg total) by mouth 2 (two) times daily.   No facility-administered encounter medications on file as of 08/23/2023.     Lab Results  Component Value Date   WBC 8.5 07/15/2023   HGB 12.3 07/15/2023   HCT  37.1 07/15/2023   PLT 248.0 07/15/2023   GLUCOSE 87 08/08/2023   CHOL 172 08/08/2023   TRIG 59.0 08/08/2023   HDL 63.40 08/08/2023   LDLDIRECT 163.4 12/31/2012   LDLCALC 97 08/08/2023   ALT 3 08/08/2023   AST 12 08/08/2023   NA 139 08/08/2023   K 4.5 08/08/2023   CL 105 08/08/2023   CREATININE 0.99 08/08/2023   BUN 23 08/08/2023   CO2 28 08/08/2023   TSH 1.80 04/16/2023   INR 1.0 12/12/2012   HGBA1C 5.9 08/08/2023    DG Tibia/Fibula Left Result Date: 08/17/2023 CLINICAL DATA:  Swelling, fall. EXAM: LEFT TIBIA AND FIBULA - 2 VIEW COMPARISON:  None Available. FINDINGS: There is no evidence of fracture or other focal bone lesions. No erosive change. Knee arthroplasty is intact. Ankle alignment is maintained. Generalized soft tissue edema. IMPRESSION: 1. Generalized soft tissue edema. 2. No acute osseous findings.  Intact knee arthroplasty. Electronically Signed   By: Narda Rutherford M.D.   On: 08/17/2023 16:41   US Venous Img Lower Unilateral Left Result Date: 08/17/2023 CLINICAL DATA:  Swelling, fall. EXAM: LEFT LOWER EXTREMITY VENOUS DOPPLER ULTRASOUND TECHNIQUE: Gray-scale sonography with compression, as well as color and duplex ultrasound, were performed to evaluate the deep venous system(s) from the level of the common femoral vein through the popliteal and proximal calf veins. COMPARISON:  Left lower extremity ultrasound 01/30/2022 FINDINGS: VENOUS Normal compressibility of the common femoral, superficial femoral, and popliteal veins, as well as the visualized calf veins. Visualized portions of profunda femoral vein and great saphenous vein unremarkable. No filling defects to suggest DVT on grayscale or color Doppler imaging. Doppler waveforms show normal direction of venous flow, normal respiratory plasticity and response to augmentation. Limited views of the contralateral common femoral vein are unremarkable. OTHER None. Limitations: none IMPRESSION: No evidence of left lower extremity  DVT. Electronically Signed   By: Narda Rutherford M.D.   On: 08/17/2023 16:39       Assessment & Plan:  Wound of left lower extremity, initial encounter Assessment & Plan: Open wound - left lower leg. No increased erythema. Hold on more abx. Hold on topical ointments. Cover when up and moving. Leave open when sitting. Elevate legs. F/u at wound center.    Lower extremity edema Assessment & Plan: Elevate legs. Follow.    Parkinson's disease, unspecified whether dyskinesia present, unspecified whether manifestations fluctuate (HCC) Assessment & Plan: Followed by Dr Arbutus Leas. Continues on sinemet. Doing better since adjustment in her dose of sinemet.  Discussed using compression binder. Follow.    Primary hypertension Assessment & Plan: On no medication. Recheck improved. Follow.    Other orders -     Citalopram Hydrobromide; Take 1 tablet (20 mg total) by mouth daily.  Dispense: 30 tablet; Refill: 5     Dale [place], MD

## 2023-08-23 NOTE — Assessment & Plan Note (Signed)
 On no medication. Recheck improved. Follow.

## 2023-08-23 NOTE — Telephone Encounter (Signed)
 Copied from CRM [id number]. Topic: General - Other >> Aug 23, 2023 10:21 AM Fonda Kinder J wrote: Reason for CRM: PT states she wanted to advise Dr. Lorin Picket [facility]01/[facility]23 @ 1:45

## 2023-08-27 ENCOUNTER — Encounter: Attending: Physician Assistant | Admitting: Physician Assistant

## 2023-08-27 ENCOUNTER — Ambulatory Visit: Admitting: Physician Assistant

## 2023-08-27 DIAGNOSIS — L97822 Non-pressure chronic ulcer of other part of left lower leg with fat layer exposed: Secondary | ICD-10-CM | POA: Insufficient documentation

## 2023-08-27 DIAGNOSIS — S81812A Laceration without foreign body, left lower leg, initial encounter: Secondary | ICD-10-CM | POA: Diagnosis not present

## 2023-08-27 DIAGNOSIS — I10 Essential (primary) hypertension: Secondary | ICD-10-CM | POA: Diagnosis not present

## 2023-08-27 DIAGNOSIS — I890 Lymphedema, not elsewhere classified: Secondary | ICD-10-CM | POA: Insufficient documentation

## 2023-08-27 DIAGNOSIS — M1711 Unilateral primary osteoarthritis, right knee: Secondary | ICD-10-CM | POA: Diagnosis not present

## 2023-08-27 DIAGNOSIS — I87332 Chronic venous hypertension (idiopathic) with ulcer and inflammation of left lower extremity: Secondary | ICD-10-CM | POA: Diagnosis not present

## 2023-08-27 NOTE — Telephone Encounter (Signed)
 Noted.

## 2023-08-28 ENCOUNTER — Ambulatory Visit: Payer: 59

## 2023-08-28 DIAGNOSIS — I10 Essential (primary) hypertension: Secondary | ICD-10-CM | POA: Diagnosis not present

## 2023-08-28 DIAGNOSIS — S81812A Laceration without foreign body, left lower leg, initial encounter: Secondary | ICD-10-CM | POA: Diagnosis not present

## 2023-08-30 ENCOUNTER — Ambulatory Visit: Payer: 59 | Admitting: Physical Therapy

## 2023-09-03 ENCOUNTER — Encounter: Admitting: Physician Assistant

## 2023-09-03 ENCOUNTER — Ambulatory Visit: Payer: 59 | Admitting: Physical Therapy

## 2023-09-03 DIAGNOSIS — I87332 Chronic venous hypertension (idiopathic) with ulcer and inflammation of left lower extremity: Secondary | ICD-10-CM | POA: Diagnosis not present

## 2023-09-03 DIAGNOSIS — I10 Essential (primary) hypertension: Secondary | ICD-10-CM | POA: Diagnosis not present

## 2023-09-03 DIAGNOSIS — S81812A Laceration without foreign body, left lower leg, initial encounter: Secondary | ICD-10-CM | POA: Diagnosis not present

## 2023-09-03 DIAGNOSIS — L97822 Non-pressure chronic ulcer of other part of left lower leg with fat layer exposed: Secondary | ICD-10-CM | POA: Diagnosis not present

## 2023-09-03 DIAGNOSIS — I890 Lymphedema, not elsewhere classified: Secondary | ICD-10-CM | POA: Diagnosis not present

## 2023-09-10 ENCOUNTER — Ambulatory Visit: Payer: 59 | Admitting: Physical Therapy

## 2023-09-12 ENCOUNTER — Ambulatory Visit: Payer: 59

## 2023-09-12 ENCOUNTER — Ambulatory Visit: Admitting: Physician Assistant

## 2023-09-17 ENCOUNTER — Ambulatory Visit: Payer: 59

## 2023-09-19 ENCOUNTER — Ambulatory Visit: Payer: 59 | Admitting: Physical Therapy

## 2023-09-19 ENCOUNTER — Other Ambulatory Visit: Payer: Self-pay | Admitting: Internal Medicine

## 2023-09-19 NOTE — Telephone Encounter (Signed)
Rx ok'd for xanax #30 with no refills.  ?

## 2023-09-20 ENCOUNTER — Encounter: Attending: Physician Assistant | Admitting: Physician Assistant

## 2023-09-20 ENCOUNTER — Telehealth: Payer: Self-pay

## 2023-09-20 DIAGNOSIS — S81812A Laceration without foreign body, left lower leg, initial encounter: Secondary | ICD-10-CM | POA: Diagnosis not present

## 2023-09-20 DIAGNOSIS — I129 Hypertensive chronic kidney disease with stage 1 through stage 4 chronic kidney disease, or unspecified chronic kidney disease: Secondary | ICD-10-CM | POA: Diagnosis not present

## 2023-09-20 DIAGNOSIS — N183 Chronic kidney disease, stage 3 unspecified: Secondary | ICD-10-CM | POA: Diagnosis not present

## 2023-09-20 DIAGNOSIS — I87332 Chronic venous hypertension (idiopathic) with ulcer and inflammation of left lower extremity: Secondary | ICD-10-CM | POA: Diagnosis not present

## 2023-09-20 DIAGNOSIS — I2510 Atherosclerotic heart disease of native coronary artery without angina pectoris: Secondary | ICD-10-CM | POA: Diagnosis not present

## 2023-09-20 DIAGNOSIS — L97822 Non-pressure chronic ulcer of other part of left lower leg with fat layer exposed: Secondary | ICD-10-CM | POA: Insufficient documentation

## 2023-09-20 DIAGNOSIS — I890 Lymphedema, not elsewhere classified: Secondary | ICD-10-CM | POA: Insufficient documentation

## 2023-09-20 DIAGNOSIS — W19XXXA Unspecified fall, initial encounter: Secondary | ICD-10-CM | POA: Diagnosis not present

## 2023-09-20 NOTE — Telephone Encounter (Signed)
 Called patient to confirm nothing acute going on. Her bp has been elevated around 170/70. Pt requesting to see Dr Lorin Picket. She is going to continue to monitor through the weekend and see Dr Lorin Picket on Monday. Confirmed patient is doing ok. Confirmed no acute symptoms at this time.

## 2023-09-20 NOTE — Telephone Encounter (Signed)
 Copied from CRM [id number]. Topic: Clinical - Medication Question >> Sep 20, 2023  4:06 PM Victoria Hanson wrote: Reason for CRM: Patient called stating her blood pressure today was 170/70 and is asking if she can be started on a blood pressure medication. Callback number [phone]

## 2023-09-22 NOTE — Progress Notes (Unsigned)
 Subjective:    Patient ID: Victoria Hanson, female    DOB: Jul 03, 1950, 73 y.o.   MRN: [id number]  Patient here for No chief complaint on file.   HPI Here for a work in appt - work in with concerns regarding elevated blood pressure. Saw ortho 08/27/23 - s/p hyaluronic acid injection with gelsyn - right knee. Has neurogenic orthostatic hypotension. She is off amlodipine. Varying blood pressures. Have discussed the need to use the abdominal compression binder. Also discussed hydration.    Past Medical History:  Diagnosis Date   Anxiety    Asthma    Atypical chest pain    a. 2003 Cath: reportedly nl per pt Bary Likes);  b. 04/2015 [facility] Admission, neg troponin.   Colon cancer (HCC) 1999   a. 1999: Pt had partial colectomy. Did develop lung metastasis. Had right upper lobectomy in 01/2007 then had associated chemotherapy.   COPD (chronic obstructive pulmonary disease) (HCC)    DDD (degenerative disc disease), lumbosacral    Depression    Echocardiogram abnormal    a. 02/2010 Echo: EF 55-60%, mild LAE, no regional wall motion abnormalities   Elevated transaminase level    a. ? NASH;  b. 06/2015 - nl LFTs.   GERD (gastroesophageal reflux disease)    Hematuria    Holter monitor, abnormal    a. 12/2009 Holter monitoring: NSR, rare PACs and PVCs.   Hypercholesterolemia    Hyperlipidemia    Hypertension    Leaky heart valve    a. Per pt report - no evidence of valvular abnormalities on prior echoes.   Lesion of skin of Right breast 10/20/2017   Lung metastases 2008   a. S/P resection (lobectomy - right)   Morbid obesity (HCC)    Nephrolithiasis    pt denies   Parkinson disease (HCC) 08/2016   [name]   Past Surgical History:  Procedure Laterality Date   BREAST EXCISIONAL BIOPSY Right 10/07/2009   benign   CARDIAC CATHETERIZATION  2003   negative as per pt report   CHOLECYSTECTOMY  2000   COLON SURGERY     Colon cancer, removed part of large colon   COLONOSCOPY N/A 04/25/2015    Procedure: COLONOSCOPY;  Surgeon: Cassie Click, MD;  Location: [facility] ENDOSCOPY;  Service: Endoscopy;  Laterality: N/A;   COLONOSCOPY WITH PROPOFOL N/A 06/14/2017   Procedure: COLONOSCOPY WITH PROPOFOL;  Surgeon: Cassie Click, MD;  Location: [facility] ENDOSCOPY;  Service: Endoscopy;  Laterality: N/A;   COLONOSCOPY WITH PROPOFOL N/A 11/17/2019   Procedure: COLONOSCOPY WITH PROPOFOL;  Surgeon: Luke Salaam, MD;  Location: [facility] ENDOSCOPY;  Service: Gastroenterology;  Laterality: N/A;   COLONOSCOPY WITH PROPOFOL N/A 03/21/2021   Procedure: COLONOSCOPY WITH PROPOFOL;  Surgeon: Shane Darling, MD;  Location: [facility] ENDOSCOPY;  Service: Endoscopy;  Laterality: N/A;   ESOPHAGOGASTRODUODENOSCOPY (EGD) WITH PROPOFOL N/A 03/21/2021   Procedure: ESOPHAGOGASTRODUODENOSCOPY (EGD) WITH PROPOFOL;  Surgeon: Shane Darling, MD;  Location: [facility] ENDOSCOPY;  Service: Endoscopy;  Laterality: N/A;   LOBECTOMY  01/2007   Right lung, upper lobe right side removed   REPLACEMENT TOTAL KNEE Left 08/2020   TOTAL ABDOMINAL HYSTERECTOMY     abnormal bleeding   TUBAL LIGATION  1980   Family History  Problem Relation Age of Onset   Coronary artery disease Mother        died @ 27 of MI.   Mental illness Mother    Diabetes Mother    Heart attack Father 53  MI   Cancer Father        lung - died in his mid-70's   Hypertension Father    Diabetes Father    Sudden death Brother    Mental illness Brother    Heart attack Brother        MI @ 42, died @ age 66.   Hypertension Child    Breast cancer Neg Hx    Social History   Socioeconomic History   Marital status: Widowed    Spouse name: Not on file   Number of children: Not on file   Years of education: Not on file   Highest education level: Not on file  Occupational History   Not on file  Tobacco Use   Smoking status: Never    Passive exposure: Yes   Smokeless tobacco: Never   Tobacco comments:    husband and son smoked in her home.  Vaping  Use   Vaping status: Never Used  Substance and Sexual Activity   Alcohol use: Not Currently    Alcohol/week: 0.0 standard drinks of alcohol   Drug use: No   Sexual activity: Never  Other Topics Concern   Not on file  Social History Narrative   Widowed.   Disabled (colon/lung CA), retired.   Lives in [place] with her dtr, grand-dtr, and son.   Active.   Gets regular exercise, walks.   Right handed    Social Drivers of Health   Financial Resource Strain: Low Risk  (01/07/2023)   Overall Financial Resource Strain (CARDIA)    Difficulty of Paying Living Expenses: Not hard at all  Food Insecurity: No Food Insecurity (01/07/2023)   Hunger Vital Sign    Worried About Running Out of Food in the Last Year: Never true    Ran Out of Food in the Last Year: Never true  Transportation Needs: No Transportation Needs (01/07/2023)   PRAPARE - Administrator, Civil Service (Medical): No    Lack of Transportation (Non-Medical): No  Physical Activity: Inactive (01/07/2023)   Exercise Vital Sign    Days of Exercise per Week: 0 days    Minutes of Exercise per Session: 0 min  Stress: Stress Concern Present (01/07/2023)   [employer] of Occupational Health - Occupational Stress Questionnaire    Feeling of Stress : Rather much  Social Connections: Moderately Integrated (01/07/2023)   Social Connection and Isolation Panel [NHANES]    Frequency of Communication with Friends and Family: More than three times a week    Frequency of Social Gatherings with Friends and Family: More than three times a week    Attends Religious Services: More than 4 times per year    Active Member of [employer] or Organizations: Yes    Attends Banker Meetings: More than 4 times per year    Marital Status: Widowed     Review of Systems     Objective:     There were no vitals taken for this visit. Wt Readings from Last 3 Encounters:  08/23/23 179 lb 8 oz (81.4 kg)  08/17/23 178 lb  (80.7 kg)  08/12/23 178 lb (80.7 kg)    Physical Exam  {Perform Simple Foot Exam  Perform Detailed exam:1} {Insert foot Exam (Optional):30965}   Outpatient Encounter Medications as of 09/23/2023  Medication Sig   albuterol (VENTOLIN HFA) 108 (90 Base) MCG/ACT inhaler INHALE 2 INHALATIONS BY MOUTH INTO THE LUNGS EVERY 6 HOURS AS NEEDED FOR WHEEZING   ALPRAZolam (  XANAX) 0.5 MG tablet TAKE 1/2 TABLET BY MOUTH ONCE DAILY AS NEEDED   AMBULATORY NON FORMULARY MEDICATION Abdominal compression binder   aspirin EC 81 MG tablet Take 81 mg by mouth as needed.    Carbidopa-Levodopa ER (SINEMET CR) 25-100 MG tablet controlled release Take 2 at 7 AM, 2 at 11 AM, 1 at 4 PM and 2 tablets at bedtime .   cholecalciferol (VITAMIN D3) 25 MCG (1000 UNIT) tablet Take 1,000 Units by mouth daily.   citalopram (CELEXA) 20 MG tablet Take 1 tablet (20 mg total) by mouth daily.   fluticasone-salmeterol (ADVAIR HFA) 115-21 MCG/ACT inhaler INHALE 2 INHALATIONS INTO THE LUNGS EVERY 12 HOURS   montelukast (SINGULAIR) 10 MG tablet TAKE 1 TABLET BY MOUTH AT BEDTIME   Spacer/Aero-Holding Chambers DEVI 1 Units by Does not apply route daily.   traZODone (DESYREL) 50 MG tablet TAKE 2 TABLETS BY MOUTH AT BEDTIME   vitamin B-12 (CYANOCOBALAMIN) 100 MCG tablet Take 100 mcg by mouth daily.   vitamin E 180 MG (400 UNITS) capsule Take 400 Units by mouth daily.   No facility-administered encounter medications on file as of 09/23/2023.     Lab Results  Component Value Date   WBC 8.5 07/15/2023   HGB 12.3 07/15/2023   HCT 37.1 07/15/2023   PLT 248.0 07/15/2023   GLUCOSE 87 08/08/2023   CHOL 172 08/08/2023   TRIG 59.0 08/08/2023   HDL 63.40 08/08/2023   LDLDIRECT 163.4 12/31/2012   LDLCALC 97 08/08/2023   ALT 3 08/08/2023   AST 12 08/08/2023   NA 139 08/08/2023   K 4.5 08/08/2023   CL 105 08/08/2023   CREATININE 0.99 08/08/2023   BUN 23 08/08/2023   CO2 28 08/08/2023   TSH 1.80 04/16/2023   INR 1.0 12/12/2012    HGBA1C 5.9 08/08/2023    DG Tibia/Fibula Left Result Date: 08/17/2023 CLINICAL DATA:  Swelling, fall. EXAM: LEFT TIBIA AND FIBULA - 2 VIEW COMPARISON:  None Available. FINDINGS: There is no evidence of fracture or other focal bone lesions. No erosive change. Knee arthroplasty is intact. Ankle alignment is maintained. Generalized soft tissue edema. IMPRESSION: 1. Generalized soft tissue edema. 2. No acute osseous findings.  Intact knee arthroplasty. Electronically Signed   By: Chadwick Colonel M.D.   On: 08/17/2023 16:41   US  Venous Img Lower Unilateral Left Result Date: 08/17/2023 CLINICAL DATA:  Swelling, fall. EXAM: LEFT LOWER EXTREMITY VENOUS DOPPLER ULTRASOUND TECHNIQUE: Gray-scale sonography with compression, as well as color and duplex ultrasound, were performed to evaluate the deep venous system(s) from the level of the common femoral vein through the popliteal and proximal calf veins. COMPARISON:  Left lower extremity ultrasound 01/30/2022 FINDINGS: VENOUS Normal compressibility of the common femoral, superficial femoral, and popliteal veins, as well as the visualized calf veins. Visualized portions of profunda femoral vein and great saphenous vein unremarkable. No filling defects to suggest DVT on grayscale or color Doppler imaging. Doppler waveforms show normal direction of venous flow, normal respiratory plasticity and response to augmentation. Limited views of the contralateral common femoral vein are unremarkable. OTHER None. Limitations: none IMPRESSION: No evidence of left lower extremity DVT. Electronically Signed   By: Chadwick Colonel M.D.   On: 08/17/2023 16:39       Assessment & Plan:  There are no diagnoses linked to this encounter.   Dellar Fenton, MD

## 2023-09-23 ENCOUNTER — Ambulatory Visit (INDEPENDENT_AMBULATORY_CARE_PROVIDER_SITE_OTHER): Admitting: Internal Medicine

## 2023-09-23 VITALS — BP 128/72 | HR 71 | Temp 98.0°F | Resp 16 | Ht 64.0 in | Wt 180.0 lb

## 2023-09-23 DIAGNOSIS — Z8589 Personal history of malignant neoplasm of other organs and systems: Secondary | ICD-10-CM | POA: Diagnosis not present

## 2023-09-23 DIAGNOSIS — K760 Fatty (change of) liver, not elsewhere classified: Secondary | ICD-10-CM | POA: Diagnosis not present

## 2023-09-23 DIAGNOSIS — N183 Chronic kidney disease, stage 3 unspecified: Secondary | ICD-10-CM | POA: Diagnosis not present

## 2023-09-23 DIAGNOSIS — I700 Atherosclerosis of aorta: Secondary | ICD-10-CM | POA: Diagnosis not present

## 2023-09-23 DIAGNOSIS — R739 Hyperglycemia, unspecified: Secondary | ICD-10-CM | POA: Diagnosis not present

## 2023-09-23 DIAGNOSIS — S81812A Laceration without foreign body, left lower leg, initial encounter: Secondary | ICD-10-CM | POA: Diagnosis not present

## 2023-09-23 DIAGNOSIS — F419 Anxiety disorder, unspecified: Secondary | ICD-10-CM

## 2023-09-23 DIAGNOSIS — I10 Essential (primary) hypertension: Secondary | ICD-10-CM | POA: Diagnosis not present

## 2023-09-23 DIAGNOSIS — E7800 Pure hypercholesterolemia, unspecified: Secondary | ICD-10-CM | POA: Diagnosis not present

## 2023-09-23 DIAGNOSIS — G20A1 Parkinson's disease without dyskinesia, without mention of fluctuations: Secondary | ICD-10-CM

## 2023-09-23 DIAGNOSIS — F339 Major depressive disorder, recurrent, unspecified: Secondary | ICD-10-CM

## 2023-09-23 DIAGNOSIS — I773 Arterial fibromuscular dysplasia: Secondary | ICD-10-CM

## 2023-09-23 DIAGNOSIS — I779 Disorder of arteries and arterioles, unspecified: Secondary | ICD-10-CM | POA: Diagnosis not present

## 2023-09-23 DIAGNOSIS — M7989 Other specified soft tissue disorders: Secondary | ICD-10-CM

## 2023-09-24 ENCOUNTER — Other Ambulatory Visit: Payer: Self-pay | Admitting: Internal Medicine

## 2023-09-24 ENCOUNTER — Encounter: Payer: Self-pay | Admitting: Internal Medicine

## 2023-09-24 NOTE — Assessment & Plan Note (Signed)
Has been evaluated by [facility].  Continue risk factor modification.  

## 2023-09-24 NOTE — Assessment & Plan Note (Signed)
 Stable. Continues f/u with wound clinic for continued treatment - lower extremity wound.

## 2023-09-24 NOTE — Telephone Encounter (Signed)
Rx ok'd for trazodone.

## 2023-09-24 NOTE — Assessment & Plan Note (Signed)
 Followed by Dr Winferd Hatter. Continues on sinemet. Tolerating and appears to be doing better. Discussed using compression binder. Follow.

## 2023-09-24 NOTE — Assessment & Plan Note (Signed)
 Off crestor. Did not tolerate. Low cholesterol diet and exercise. Follow lipid panel and liver function tests.

## 2023-09-24 NOTE — Assessment & Plan Note (Signed)
 S/p lobectomy.  Followed by pulmonary.  Albuterol inhaler if needed. Breathing stable.

## 2023-09-24 NOTE — Assessment & Plan Note (Signed)
 Low carb diet and exercise. Follow met b and A1c.   Lab Results  Component Value Date   HGBA1C 5.9 08/08/2023

## 2023-09-24 NOTE — Assessment & Plan Note (Signed)
 Noted over this weekend - elevated blood pressure. Blood pressure checks here - ok. Rechecked by me 128/72. Discussed holding on adding blood pressure medication, especially given blood pressure readings here in the office and her history of blood pressure fluctuations.  Follow. Hold on medication.

## 2023-09-24 NOTE — Assessment & Plan Note (Signed)
 Discussed increased anxiety. Uses xanal prn. Discussed trying to limit use. On citalopram and trazodone. Discussed further treatment. Discussed psychiatry evaluation. Agreeable. Hold on changing medication.

## 2023-09-24 NOTE — Assessment & Plan Note (Signed)
 Diet and exercise.  Seen on chest CT angio - 09/11/21. Follow liver panel.

## 2023-09-24 NOTE — Assessment & Plan Note (Signed)
 Continue to avoid antiinflammatory medication. Off micardis due to low pressures. Blood pressure as outlined. No change in medication. Follow metabolic panel.

## 2023-09-24 NOTE — Assessment & Plan Note (Signed)
 Continue citalopram. Increased anxiety. Discussed. She is agreeable for referral to psychiatry. Continues on trazodone to help with sleep.

## 2023-09-24 NOTE — Assessment & Plan Note (Signed)
 Off crestor

## 2023-09-24 NOTE — Assessment & Plan Note (Signed)
 Off crestor. Intolerance. Blood pressure controlled.

## 2023-09-25 ENCOUNTER — Ambulatory Visit: Payer: 59

## 2023-09-27 ENCOUNTER — Ambulatory Visit

## 2023-10-01 ENCOUNTER — Encounter

## 2023-10-01 ENCOUNTER — Ambulatory Visit: Admitting: Physician Assistant

## 2023-10-01 DIAGNOSIS — S81812A Laceration without foreign body, left lower leg, initial encounter: Secondary | ICD-10-CM | POA: Diagnosis not present

## 2023-10-01 DIAGNOSIS — I2510 Atherosclerotic heart disease of native coronary artery without angina pectoris: Secondary | ICD-10-CM | POA: Diagnosis not present

## 2023-10-01 DIAGNOSIS — I890 Lymphedema, not elsewhere classified: Secondary | ICD-10-CM | POA: Diagnosis not present

## 2023-10-01 DIAGNOSIS — N183 Chronic kidney disease, stage 3 unspecified: Secondary | ICD-10-CM | POA: Diagnosis not present

## 2023-10-01 DIAGNOSIS — I129 Hypertensive chronic kidney disease with stage 1 through stage 4 chronic kidney disease, or unspecified chronic kidney disease: Secondary | ICD-10-CM | POA: Diagnosis not present

## 2023-10-01 DIAGNOSIS — L97822 Non-pressure chronic ulcer of other part of left lower leg with fat layer exposed: Secondary | ICD-10-CM | POA: Diagnosis not present

## 2023-10-01 DIAGNOSIS — I87332 Chronic venous hypertension (idiopathic) with ulcer and inflammation of left lower extremity: Secondary | ICD-10-CM | POA: Diagnosis not present

## 2023-10-02 ENCOUNTER — Ambulatory Visit: Payer: 59

## 2023-10-03 ENCOUNTER — Ambulatory Visit (INDEPENDENT_AMBULATORY_CARE_PROVIDER_SITE_OTHER): Payer: 59 | Admitting: Nurse Practitioner

## 2023-10-04 ENCOUNTER — Ambulatory Visit: Payer: 59

## 2023-10-08 ENCOUNTER — Ambulatory Visit: Admitting: Physician Assistant

## 2023-10-08 ENCOUNTER — Ambulatory Visit: Payer: 59

## 2023-10-09 ENCOUNTER — Encounter: Admitting: Physician Assistant

## 2023-10-09 DIAGNOSIS — S81812A Laceration without foreign body, left lower leg, initial encounter: Secondary | ICD-10-CM | POA: Diagnosis not present

## 2023-10-09 DIAGNOSIS — N183 Chronic kidney disease, stage 3 unspecified: Secondary | ICD-10-CM | POA: Diagnosis not present

## 2023-10-09 DIAGNOSIS — L97825 Non-pressure chronic ulcer of other part of left lower leg with muscle involvement without evidence of necrosis: Secondary | ICD-10-CM | POA: Diagnosis not present

## 2023-10-09 DIAGNOSIS — I890 Lymphedema, not elsewhere classified: Secondary | ICD-10-CM | POA: Diagnosis not present

## 2023-10-09 DIAGNOSIS — I87332 Chronic venous hypertension (idiopathic) with ulcer and inflammation of left lower extremity: Secondary | ICD-10-CM | POA: Diagnosis not present

## 2023-10-09 DIAGNOSIS — I2510 Atherosclerotic heart disease of native coronary artery without angina pectoris: Secondary | ICD-10-CM | POA: Diagnosis not present

## 2023-10-09 DIAGNOSIS — I129 Hypertensive chronic kidney disease with stage 1 through stage 4 chronic kidney disease, or unspecified chronic kidney disease: Secondary | ICD-10-CM | POA: Diagnosis not present

## 2023-10-09 DIAGNOSIS — L97822 Non-pressure chronic ulcer of other part of left lower leg with fat layer exposed: Secondary | ICD-10-CM | POA: Diagnosis not present

## 2023-10-10 DIAGNOSIS — S81812A Laceration without foreign body, left lower leg, initial encounter: Secondary | ICD-10-CM | POA: Diagnosis not present

## 2023-10-10 DIAGNOSIS — I10 Essential (primary) hypertension: Secondary | ICD-10-CM | POA: Diagnosis not present

## 2023-10-11 ENCOUNTER — Ambulatory Visit: Payer: 59 | Admitting: Physical Therapy

## 2023-10-15 ENCOUNTER — Encounter: Attending: Physician Assistant | Admitting: Physician Assistant

## 2023-10-15 DIAGNOSIS — X58XXXA Exposure to other specified factors, initial encounter: Secondary | ICD-10-CM | POA: Insufficient documentation

## 2023-10-15 DIAGNOSIS — I2510 Atherosclerotic heart disease of native coronary artery without angina pectoris: Secondary | ICD-10-CM | POA: Diagnosis not present

## 2023-10-15 DIAGNOSIS — I890 Lymphedema, not elsewhere classified: Secondary | ICD-10-CM | POA: Insufficient documentation

## 2023-10-15 DIAGNOSIS — N183 Chronic kidney disease, stage 3 unspecified: Secondary | ICD-10-CM | POA: Diagnosis not present

## 2023-10-15 DIAGNOSIS — L97822 Non-pressure chronic ulcer of other part of left lower leg with fat layer exposed: Secondary | ICD-10-CM | POA: Insufficient documentation

## 2023-10-15 DIAGNOSIS — I129 Hypertensive chronic kidney disease with stage 1 through stage 4 chronic kidney disease, or unspecified chronic kidney disease: Secondary | ICD-10-CM | POA: Diagnosis not present

## 2023-10-15 DIAGNOSIS — S81812A Laceration without foreign body, left lower leg, initial encounter: Secondary | ICD-10-CM | POA: Diagnosis not present

## 2023-10-15 DIAGNOSIS — I87332 Chronic venous hypertension (idiopathic) with ulcer and inflammation of left lower extremity: Secondary | ICD-10-CM | POA: Diagnosis not present

## 2023-10-16 ENCOUNTER — Ambulatory Visit: Payer: 59

## 2023-10-17 ENCOUNTER — Telehealth: Payer: Self-pay

## 2023-10-17 ENCOUNTER — Other Ambulatory Visit (HOSPITAL_COMMUNITY): Payer: Self-pay

## 2023-10-17 NOTE — Telephone Encounter (Signed)
 Pharmacy Patient Advocate Encounter   Received notification from Patient Pharmacy that prior authorization for Advair  HFA 115-21 is required/requested.   Insurance verification completed.   The patient is insured through [employer] .   Per test claim:  Symbicort, Breo, Dulera  is preferred by the insurance.  If suggested medication is appropriate, Please send in a new RX and discontinue this one. If not, please advise as to why it's not appropriate so that we may request a Prior Authorization. Please note, some preferred medications may still require a PA.  If the suggested medications have not been trialed and there are no contraindications to their use, the PA will not be submitted, as it will not be approved.

## 2023-10-18 ENCOUNTER — Encounter: Payer: Self-pay | Admitting: Internal Medicine

## 2023-10-18 ENCOUNTER — Ambulatory Visit (INDEPENDENT_AMBULATORY_CARE_PROVIDER_SITE_OTHER): Admitting: Internal Medicine

## 2023-10-18 ENCOUNTER — Ambulatory Visit: Payer: 59

## 2023-10-18 ENCOUNTER — Ambulatory Visit: Admitting: Internal Medicine

## 2023-10-18 VITALS — BP 140/76 | HR 68 | Ht 64.0 in | Wt 176.6 lb

## 2023-10-18 DIAGNOSIS — R2681 Unsteadiness on feet: Secondary | ICD-10-CM | POA: Diagnosis not present

## 2023-10-18 DIAGNOSIS — G20A1 Parkinson's disease without dyskinesia, without mention of fluctuations: Secondary | ICD-10-CM

## 2023-10-18 DIAGNOSIS — I700 Atherosclerosis of aorta: Secondary | ICD-10-CM | POA: Diagnosis not present

## 2023-10-18 DIAGNOSIS — I779 Disorder of arteries and arterioles, unspecified: Secondary | ICD-10-CM | POA: Diagnosis not present

## 2023-10-18 DIAGNOSIS — Z85038 Personal history of other malignant neoplasm of large intestine: Secondary | ICD-10-CM

## 2023-10-18 DIAGNOSIS — G903 Multi-system degeneration of the autonomic nervous system: Secondary | ICD-10-CM | POA: Diagnosis not present

## 2023-10-18 DIAGNOSIS — K760 Fatty (change of) liver, not elsewhere classified: Secondary | ICD-10-CM | POA: Diagnosis not present

## 2023-10-18 DIAGNOSIS — M25562 Pain in left knee: Secondary | ICD-10-CM

## 2023-10-18 DIAGNOSIS — I10 Essential (primary) hypertension: Secondary | ICD-10-CM | POA: Diagnosis not present

## 2023-10-18 DIAGNOSIS — R739 Hyperglycemia, unspecified: Secondary | ICD-10-CM | POA: Diagnosis not present

## 2023-10-18 DIAGNOSIS — N183 Chronic kidney disease, stage 3 unspecified: Secondary | ICD-10-CM

## 2023-10-18 DIAGNOSIS — Z8589 Personal history of malignant neoplasm of other organs and systems: Secondary | ICD-10-CM

## 2023-10-18 DIAGNOSIS — E782 Mixed hyperlipidemia: Secondary | ICD-10-CM | POA: Diagnosis not present

## 2023-10-18 DIAGNOSIS — F339 Major depressive disorder, recurrent, unspecified: Secondary | ICD-10-CM

## 2023-10-18 DIAGNOSIS — M7989 Other specified soft tissue disorders: Secondary | ICD-10-CM | POA: Diagnosis not present

## 2023-10-18 DIAGNOSIS — S81802D Unspecified open wound, left lower leg, subsequent encounter: Secondary | ICD-10-CM

## 2023-10-18 NOTE — Progress Notes (Unsigned)
 Subjective:    Patient ID: Victoria Hanson, female    DOB: 20-Aug-1950, 73 y.o.   MRN: [id number]  Patient here for No chief complaint on file.   HPI Here for work in appt. Work in to discuss blood pressure. Has neurogenic orthostatic hypotension. She is off amlodipine . Varying blood pressures. Have discussed the need to use the abdominal compression binder. Also have discussed hydration.  Referred to psychiatry.    Past Medical History:  Diagnosis Date   Anxiety    Asthma    Atypical chest pain    a. 2003 Cath: reportedly nl per pt Bary Likes);  b. 04/2015 [facility] Admission, neg troponin.   Colon cancer (HCC) 1999   a. 1999: Pt had partial colectomy. Did develop lung metastasis. Had right upper lobectomy in 01/2007 then had associated chemotherapy.   COPD (chronic obstructive pulmonary disease) (HCC)    DDD (degenerative disc disease), lumbosacral    Depression    Echocardiogram abnormal    a. 02/2010 Echo: EF 55-60%, mild LAE, no regional wall motion abnormalities   Elevated transaminase level    a. ? NASH;  b. 06/2015 - nl LFTs.   GERD (gastroesophageal reflux disease)    Hematuria    Holter monitor, abnormal    a. 12/2009 Holter monitoring: NSR, rare PACs and PVCs.   Hypercholesterolemia    Hyperlipidemia    Hypertension    Leaky heart valve    a. Per pt report - no evidence of valvular abnormalities on prior echoes.   Lesion of skin of Right breast 10/20/2017   Lung metastases 2008   a. S/P resection (lobectomy - right)   Morbid obesity (HCC)    Nephrolithiasis    pt denies   Parkinson disease (HCC) 08/2016   [name]   Past Surgical History:  Procedure Laterality Date   BREAST EXCISIONAL BIOPSY Right 10/07/2009   benign   CARDIAC CATHETERIZATION  2003   negative as per pt report   CHOLECYSTECTOMY  2000   COLON SURGERY     Colon cancer, removed part of large colon   COLONOSCOPY N/A 04/25/2015   Procedure: COLONOSCOPY;  Surgeon: Cassie Click, MD;  Location:  [facility] ENDOSCOPY;  Service: Endoscopy;  Laterality: N/A;   COLONOSCOPY WITH PROPOFOL  N/A 06/14/2017   Procedure: COLONOSCOPY WITH PROPOFOL ;  Surgeon: Cassie Click, MD;  Location: [facility] ENDOSCOPY;  Service: Endoscopy;  Laterality: N/A;   COLONOSCOPY WITH PROPOFOL  N/A 11/17/2019   Procedure: COLONOSCOPY WITH PROPOFOL ;  Surgeon: Luke Salaam, MD;  Location: [facility] ENDOSCOPY;  Service: Gastroenterology;  Laterality: N/A;   COLONOSCOPY WITH PROPOFOL  N/A 03/21/2021   Procedure: COLONOSCOPY WITH PROPOFOL ;  Surgeon: Shane Darling, MD;  Location: [facility] ENDOSCOPY;  Service: Endoscopy;  Laterality: N/A;   ESOPHAGOGASTRODUODENOSCOPY (EGD) WITH PROPOFOL  N/A 03/21/2021   Procedure: ESOPHAGOGASTRODUODENOSCOPY (EGD) WITH PROPOFOL ;  Surgeon: Shane Darling, MD;  Location: [facility] ENDOSCOPY;  Service: Endoscopy;  Laterality: N/A;   LOBECTOMY  01/2007   Right lung, upper lobe right side removed   REPLACEMENT TOTAL KNEE Left 08/2020   TOTAL ABDOMINAL HYSTERECTOMY     abnormal bleeding   TUBAL LIGATION  1980   Family History  Problem Relation Age of Onset   Coronary artery disease Mother        died @ 69 of MI.   Mental illness Mother    Diabetes Mother    Heart attack Father 70       MI   Cancer Father  lung - died in his mid-70's   Hypertension Father    Diabetes Father    Sudden death Brother    Mental illness Brother    Heart attack Brother        MI @ 60, died @ age 81.   Hypertension Child    Breast cancer Neg Hx    Social History   Socioeconomic History   Marital status: Widowed    Spouse name: Not on file   Number of children: Not on file   Years of education: Not on file   Highest education level: Not on file  Occupational History   Not on file  Tobacco Use   Smoking status: Never    Passive exposure: Yes   Smokeless tobacco: Never   Tobacco comments:    husband and son smoked in her home.  Vaping Use   Vaping status: Never Used  Substance and Sexual Activity    Alcohol use: Not Currently    Alcohol/week: 0.0 standard drinks of alcohol   Drug use: No   Sexual activity: Never  Other Topics Concern   Not on file  Social History Narrative   Widowed.   Disabled (colon/lung CA), retired.   Lives in [place] with her dtr, grand-dtr, and son.   Active.   Gets regular exercise, walks.   Right handed    Social Drivers of Health   Financial Resource Strain: Low Risk  (01/07/2023)   Overall Financial Resource Strain (CARDIA)    Difficulty of Paying Living Expenses: Not hard at all  Food Insecurity: No Food Insecurity (01/07/2023)   Hunger Vital Sign    Worried About Running Out of Food in the Last Year: Never true    Ran Out of Food in the Last Year: Never true  Transportation Needs: No Transportation Needs (01/07/2023)   PRAPARE - Administrator, Civil Service (Medical): No    Lack of Transportation (Non-Medical): No  Physical Activity: Inactive (01/07/2023)   Exercise Vital Sign    Days of Exercise per Week: 0 days    Minutes of Exercise per Session: 0 min  Stress: Stress Concern Present (01/07/2023)   [employer] of Occupational Health - Occupational Stress Questionnaire    Feeling of Stress : Rather much  Social Connections: Moderately Integrated (01/07/2023)   Social Connection and Isolation Panel [NHANES]    Frequency of Communication with Friends and Family: More than three times a week    Frequency of Social Gatherings with Friends and Family: More than three times a week    Attends Religious Services: More than 4 times per year    Active Member of [employer] or Organizations: Yes    Attends Banker Meetings: More than 4 times per year    Marital Status: Widowed     Review of Systems     Objective:     There were no vitals taken for this visit. Wt Readings from Last 3 Encounters:  09/23/23 180 lb (81.6 kg)  08/23/23 179 lb 8 oz (81.4 kg)  08/17/23 178 lb (80.7 kg)    Physical Exam  {Perform  Simple Foot Exam  Perform Detailed exam:1} {Insert foot Exam (Optional):30965}   Outpatient Encounter Medications as of 10/18/2023  Medication Sig   albuterol  (VENTOLIN  HFA) 108 (90 Base) MCG/ACT inhaler INHALE 2 INHALATIONS BY MOUTH INTO THE LUNGS EVERY 6 HOURS AS NEEDED FOR WHEEZING   ALPRAZolam  (XANAX ) 0.5 MG tablet TAKE 1/2 TABLET BY MOUTH ONCE DAILY AS  NEEDED   AMBULATORY NON FORMULARY MEDICATION Abdominal compression binder   aspirin  EC 81 MG tablet Take 81 mg by mouth as needed.    Carbidopa -Levodopa  ER (SINEMET  CR) 25-100 MG tablet controlled release Take 2 at 7 AM, 2 at 11 AM, 1 at 4 PM and 2 tablets at bedtime .   cholecalciferol (VITAMIN D3) 25 MCG (1000 UNIT) tablet Take 1,000 Units by mouth daily.   citalopram  (CELEXA ) 20 MG tablet Take 1 tablet (20 mg total) by mouth daily.   fluticasone -salmeterol (ADVAIR  HFA) 115-21 MCG/ACT inhaler INHALE 2 INHALATIONS INTO THE LUNGS EVERY 12 HOURS   montelukast  (SINGULAIR ) 10 MG tablet TAKE 1 TABLET BY MOUTH AT BEDTIME   Spacer/Aero-Holding Chambers DEVI 1 Units by Does not apply route daily.   traZODone  (DESYREL ) 50 MG tablet TAKE 2 TABLETS BY MOUTH AT BEDTIME   vitamin B-12 (CYANOCOBALAMIN ) 100 MCG tablet Take 100 mcg by mouth daily.   vitamin E 180 MG (400 UNITS) capsule Take 400 Units by mouth daily.   No facility-administered encounter medications on file as of 10/18/2023.     Lab Results  Component Value Date   WBC 8.5 07/15/2023   HGB 12.3 07/15/2023   HCT 37.1 07/15/2023   PLT 248.0 07/15/2023   GLUCOSE 87 08/08/2023   CHOL 172 08/08/2023   TRIG 59.0 08/08/2023   HDL 63.40 08/08/2023   LDLDIRECT 163.4 12/31/2012   LDLCALC 97 08/08/2023   ALT 3 08/08/2023   AST 12 08/08/2023   NA 139 08/08/2023   K 4.5 08/08/2023   CL 105 08/08/2023   CREATININE 0.99 08/08/2023   BUN 23 08/08/2023   CO2 28 08/08/2023   TSH 1.80 04/16/2023   INR 1.0 12/12/2012   HGBA1C 5.9 08/08/2023    DG Tibia/Fibula Left Result Date:  08/17/2023 CLINICAL DATA:  Swelling, fall. EXAM: LEFT TIBIA AND FIBULA - 2 VIEW COMPARISON:  None Available. FINDINGS: There is no evidence of fracture or other focal bone lesions. No erosive change. Knee arthroplasty is intact. Ankle alignment is maintained. Generalized soft tissue edema. IMPRESSION: 1. Generalized soft tissue edema. 2. No acute osseous findings.  Intact knee arthroplasty. Electronically Signed   By: Chadwick Colonel M.D.   On: 08/17/2023 16:41   US  Venous Img Lower Unilateral Left Result Date: 08/17/2023 CLINICAL DATA:  Swelling, fall. EXAM: LEFT LOWER EXTREMITY VENOUS DOPPLER ULTRASOUND TECHNIQUE: Gray-scale sonography with compression, as well as color and duplex ultrasound, were performed to evaluate the deep venous system(s) from the level of the common femoral vein through the popliteal and proximal calf veins. COMPARISON:  Left lower extremity ultrasound 01/30/2022 FINDINGS: VENOUS Normal compressibility of the common femoral, superficial femoral, and popliteal veins, as well as the visualized calf veins. Visualized portions of profunda femoral vein and great saphenous vein unremarkable. No filling defects to suggest DVT on grayscale or color Doppler imaging. Doppler waveforms show normal direction of venous flow, normal respiratory plasticity and response to augmentation. Limited views of the contralateral common femoral vein are unremarkable. OTHER None. Limitations: none IMPRESSION: No evidence of left lower extremity DVT. Electronically Signed   By: Chadwick Colonel M.D.   On: 08/17/2023 16:39       Assessment & Plan:  There are no diagnoses linked to this encounter.   Dellar Fenton, MD

## 2023-10-18 NOTE — Patient Instructions (Signed)
 Wear the binder  Check your machine at home to make sure is is accurate

## 2023-10-20 ENCOUNTER — Encounter: Payer: Self-pay | Admitting: Internal Medicine

## 2023-10-20 NOTE — Assessment & Plan Note (Addendum)
 S/p lobectomy.  Followed by pulmonary.  Albuterol inhaler if needed. Breathing stable.

## 2023-10-20 NOTE — Assessment & Plan Note (Signed)
 Continue citalopram . Increased anxiety. Discussed. She is agreeable for referral to psychiatry. Continues on trazodone  to help with sleep.order placed for referral to psychiatry last visit. No appt made. F/u regarding referral. She is agreeable.

## 2023-10-20 NOTE — Assessment & Plan Note (Signed)
 Stable. Continues f/u with wound clinic for continued treatment - lower extremity wound.  Doing better. Follow.

## 2023-10-20 NOTE — Assessment & Plan Note (Signed)
 Off cholesterol medication. Monitor blood pressure for control.

## 2023-10-20 NOTE — Assessment & Plan Note (Signed)
 Saw GI 12/20/22 - f/u colon cancer and anemia.  Has desired no f/u colonoscopy.

## 2023-10-20 NOTE — Assessment & Plan Note (Signed)
 Overall doing better.  Continue f/u with Dr Tat - continue sinemet  as outlined. Discussed PT - through home health. She declines at this time. Will notify me if desires order to be placed.

## 2023-10-20 NOTE — Assessment & Plan Note (Signed)
 Low-carb diet and exercise.  Follow met b and A1c.

## 2023-10-20 NOTE — Assessment & Plan Note (Signed)
 Continue to avoid antiinflammatory medication. Off micardis  due to low pressures. Blood pressure as outlined. No change in medication. Follow pressures as outlined.

## 2023-10-20 NOTE — Assessment & Plan Note (Signed)
 Diet and exercise.  Seen on chest CT angio - 09/11/21. Follow liver panel.

## 2023-10-20 NOTE — Assessment & Plan Note (Signed)
 Blood pressures varying as outlined. Check here ok. Will bring her cuff to confirm correlates. Hold on additional medication. Follow.

## 2023-10-20 NOTE — Assessment & Plan Note (Signed)
 Blood pressure ok on checks here in the office. Reports varying pressures at home. Discussed the need to wear the compression binder regularly. Will bring her cuff in to confirm correlates. Hold on additional medication. Follow.

## 2023-10-20 NOTE — Assessment & Plan Note (Signed)
 Followed by Dr Winferd Hatter. Continues on sinemet . Overall appears to be doing better. Discussed the need to wear compression binder regularly. Follow.

## 2023-10-20 NOTE — Assessment & Plan Note (Signed)
Off cholesterol medication.  Follow lipid panel.   

## 2023-10-20 NOTE — Assessment & Plan Note (Signed)
Off crestor.  Low cholesterol diet and exercise.  Follow lipid panel.  

## 2023-10-20 NOTE — Assessment & Plan Note (Signed)
 Being followed by wound clinic. Improved. Continue to follow.

## 2023-10-20 NOTE — Assessment & Plan Note (Signed)
 Has seen ortho. Wears a brace. Follow.

## 2023-10-22 ENCOUNTER — Ambulatory Visit: Admitting: Physician Assistant

## 2023-10-23 ENCOUNTER — Ambulatory Visit: Payer: 59

## 2023-10-23 ENCOUNTER — Encounter: Admitting: Physician Assistant

## 2023-10-23 DIAGNOSIS — S81812A Laceration without foreign body, left lower leg, initial encounter: Secondary | ICD-10-CM | POA: Diagnosis not present

## 2023-10-23 DIAGNOSIS — N183 Chronic kidney disease, stage 3 unspecified: Secondary | ICD-10-CM | POA: Diagnosis not present

## 2023-10-23 DIAGNOSIS — L97822 Non-pressure chronic ulcer of other part of left lower leg with fat layer exposed: Secondary | ICD-10-CM | POA: Diagnosis not present

## 2023-10-23 DIAGNOSIS — I2510 Atherosclerotic heart disease of native coronary artery without angina pectoris: Secondary | ICD-10-CM | POA: Diagnosis not present

## 2023-10-23 DIAGNOSIS — I890 Lymphedema, not elsewhere classified: Secondary | ICD-10-CM | POA: Diagnosis not present

## 2023-10-23 DIAGNOSIS — I129 Hypertensive chronic kidney disease with stage 1 through stage 4 chronic kidney disease, or unspecified chronic kidney disease: Secondary | ICD-10-CM | POA: Diagnosis not present

## 2023-10-23 DIAGNOSIS — I87332 Chronic venous hypertension (idiopathic) with ulcer and inflammation of left lower extremity: Secondary | ICD-10-CM | POA: Diagnosis not present

## 2023-10-25 ENCOUNTER — Ambulatory Visit: Payer: 59

## 2023-10-28 ENCOUNTER — Ambulatory Visit: Payer: 59 | Admitting: Physical Therapy

## 2023-10-30 ENCOUNTER — Encounter (HOSPITAL_COMMUNITY): Payer: Self-pay | Admitting: Pharmacy Technician

## 2023-10-30 ENCOUNTER — Other Ambulatory Visit (HOSPITAL_COMMUNITY): Payer: Self-pay

## 2023-10-30 ENCOUNTER — Telehealth: Payer: Self-pay | Admitting: Pharmacy Technician

## 2023-10-30 ENCOUNTER — Encounter: Admitting: Physician Assistant

## 2023-10-30 ENCOUNTER — Ambulatory Visit: Payer: 59

## 2023-10-30 DIAGNOSIS — L97822 Non-pressure chronic ulcer of other part of left lower leg with fat layer exposed: Secondary | ICD-10-CM | POA: Diagnosis not present

## 2023-10-30 DIAGNOSIS — I2510 Atherosclerotic heart disease of native coronary artery without angina pectoris: Secondary | ICD-10-CM | POA: Diagnosis not present

## 2023-10-30 DIAGNOSIS — I890 Lymphedema, not elsewhere classified: Secondary | ICD-10-CM | POA: Diagnosis not present

## 2023-10-30 DIAGNOSIS — S81812A Laceration without foreign body, left lower leg, initial encounter: Secondary | ICD-10-CM | POA: Diagnosis not present

## 2023-10-30 DIAGNOSIS — N183 Chronic kidney disease, stage 3 unspecified: Secondary | ICD-10-CM | POA: Diagnosis not present

## 2023-10-30 DIAGNOSIS — I87332 Chronic venous hypertension (idiopathic) with ulcer and inflammation of left lower extremity: Secondary | ICD-10-CM | POA: Diagnosis not present

## 2023-10-30 DIAGNOSIS — I129 Hypertensive chronic kidney disease with stage 1 through stage 4 chronic kidney disease, or unspecified chronic kidney disease: Secondary | ICD-10-CM | POA: Diagnosis not present

## 2023-10-30 NOTE — Telephone Encounter (Signed)
 Pharmacy Patient Advocate Encounter   Received notification from CoverMyMeds that prior authorization for Fluticasone -Salmeterol 115-21MCG/ACT aerosol is required/requested.   Insurance verification completed.   The patient is insured through [employer] .   Per test claim:  SYMBICORT, BREO, ADVAIR  DISKUS, DULERA is preferred by the insurance.  If suggested medication is appropriate, Please send in a new RX and discontinue this one. If not, please advise as to why it's not appropriate so that we may request a Prior Authorization. Please note, some preferred medications may still require a PA.  If the suggested medications have not been trialed and there are no contraindications to their use, the PA will not be submitted, as it will not be approved.

## 2023-10-30 NOTE — Telephone Encounter (Signed)
 ERROR

## 2023-11-01 NOTE — Telephone Encounter (Signed)
 Reviewed. She is currently receiving advair  HFA. It appears insurance not covering. Advair  discus is covered. Ok to change to advair  250

## 2023-11-06 ENCOUNTER — Ambulatory Visit: Payer: 59

## 2023-11-07 ENCOUNTER — Ambulatory Visit: Payer: 59 | Admitting: Neurology

## 2023-11-07 MED ORDER — FLUTICASONE-SALMETEROL 250-50 MCG/ACT IN AEPB: 1.0000 | INHALATION_SPRAY | Freq: Two times a day (BID) | RESPIRATORY_TRACT | 1 refills | Status: DC

## 2023-11-07 NOTE — Telephone Encounter (Signed)
 Please sent new Rx for Advair  Diskus.

## 2023-11-07 NOTE — Addendum Note (Signed)
 Addended by: Victorino Grates D on: 11/07/2023 11:33 AM   Modules accepted: Orders

## 2023-11-07 NOTE — Telephone Encounter (Signed)
 Patient aware of below. Rx sent in.

## 2023-11-08 ENCOUNTER — Ambulatory Visit: Payer: 59

## 2023-11-11 ENCOUNTER — Encounter: Attending: Physician Assistant | Admitting: Physician Assistant

## 2023-11-11 ENCOUNTER — Ambulatory Visit: Payer: 59

## 2023-11-11 DIAGNOSIS — S81812A Laceration without foreign body, left lower leg, initial encounter: Secondary | ICD-10-CM | POA: Diagnosis not present

## 2023-11-11 DIAGNOSIS — N183 Chronic kidney disease, stage 3 unspecified: Secondary | ICD-10-CM | POA: Diagnosis not present

## 2023-11-11 DIAGNOSIS — I129 Hypertensive chronic kidney disease with stage 1 through stage 4 chronic kidney disease, or unspecified chronic kidney disease: Secondary | ICD-10-CM | POA: Diagnosis not present

## 2023-11-11 DIAGNOSIS — W1809XA Striking against other object with subsequent fall, initial encounter: Secondary | ICD-10-CM | POA: Diagnosis not present

## 2023-11-11 DIAGNOSIS — I2510 Atherosclerotic heart disease of native coronary artery without angina pectoris: Secondary | ICD-10-CM | POA: Insufficient documentation

## 2023-11-11 DIAGNOSIS — I87332 Chronic venous hypertension (idiopathic) with ulcer and inflammation of left lower extremity: Secondary | ICD-10-CM | POA: Diagnosis not present

## 2023-11-11 DIAGNOSIS — S81812D Laceration without foreign body, left lower leg, subsequent encounter: Secondary | ICD-10-CM | POA: Diagnosis not present

## 2023-11-11 DIAGNOSIS — I890 Lymphedema, not elsewhere classified: Secondary | ICD-10-CM | POA: Diagnosis not present

## 2023-11-11 DIAGNOSIS — L97822 Non-pressure chronic ulcer of other part of left lower leg with fat layer exposed: Secondary | ICD-10-CM | POA: Insufficient documentation

## 2023-11-12 ENCOUNTER — Ambulatory Visit: Admitting: Internal Medicine

## 2023-11-13 ENCOUNTER — Ambulatory Visit: Admitting: Internal Medicine

## 2023-11-13 ENCOUNTER — Ambulatory Visit: Payer: 59

## 2023-11-15 ENCOUNTER — Ambulatory Visit: Admitting: Internal Medicine

## 2023-11-18 ENCOUNTER — Ambulatory Visit: Payer: 59

## 2023-11-19 ENCOUNTER — Other Ambulatory Visit (HOSPITAL_COMMUNITY): Payer: Self-pay

## 2023-11-20 ENCOUNTER — Ambulatory Visit: Payer: 59

## 2023-11-20 DIAGNOSIS — M25562 Pain in left knee: Secondary | ICD-10-CM | POA: Diagnosis not present

## 2023-11-21 ENCOUNTER — Telehealth: Payer: Self-pay

## 2023-11-21 NOTE — Telephone Encounter (Signed)
 Copied from CRM [id number]. Topic: Clinical - Medical Advice >> Nov 21, 2023  9:39 AM Victoria Hanson B wrote: Reason for CRM: pt called to speak with trish requesting a call back at [phone]

## 2023-11-22 NOTE — Telephone Encounter (Signed)
 Pt needed her appt rescheduled to a 4pm slot. Patients appt has been moved.

## 2023-11-27 DIAGNOSIS — H35363 Drusen (degenerative) of macula, bilateral: Secondary | ICD-10-CM | POA: Diagnosis not present

## 2023-11-27 DIAGNOSIS — H524 Presbyopia: Secondary | ICD-10-CM | POA: Diagnosis not present

## 2023-11-29 ENCOUNTER — Other Ambulatory Visit: Payer: Self-pay | Admitting: Internal Medicine

## 2023-12-01 NOTE — Telephone Encounter (Signed)
 Rx ok'd for alprazolam #30 with no refills.

## 2023-12-03 ENCOUNTER — Ambulatory Visit: Admitting: Internal Medicine

## 2023-12-06 ENCOUNTER — Ambulatory Visit (INDEPENDENT_AMBULATORY_CARE_PROVIDER_SITE_OTHER): Admitting: Internal Medicine

## 2023-12-06 VITALS — BP 128/76 | HR 80 | Temp 98.3°F | Resp 16 | Ht 64.0 in | Wt 180.8 lb

## 2023-12-06 DIAGNOSIS — N183 Chronic kidney disease, stage 3 unspecified: Secondary | ICD-10-CM | POA: Diagnosis not present

## 2023-12-06 DIAGNOSIS — I773 Arterial fibromuscular dysplasia: Secondary | ICD-10-CM | POA: Diagnosis not present

## 2023-12-06 DIAGNOSIS — D649 Anemia, unspecified: Secondary | ICD-10-CM

## 2023-12-06 DIAGNOSIS — R600 Localized edema: Secondary | ICD-10-CM | POA: Diagnosis not present

## 2023-12-06 DIAGNOSIS — F320 Major depressive disorder, single episode, mild: Secondary | ICD-10-CM

## 2023-12-06 DIAGNOSIS — E7800 Pure hypercholesterolemia, unspecified: Secondary | ICD-10-CM | POA: Diagnosis not present

## 2023-12-06 DIAGNOSIS — M25569 Pain in unspecified knee: Secondary | ICD-10-CM

## 2023-12-06 DIAGNOSIS — I779 Disorder of arteries and arterioles, unspecified: Secondary | ICD-10-CM | POA: Diagnosis not present

## 2023-12-06 DIAGNOSIS — R739 Hyperglycemia, unspecified: Secondary | ICD-10-CM

## 2023-12-06 DIAGNOSIS — Z85038 Personal history of other malignant neoplasm of large intestine: Secondary | ICD-10-CM

## 2023-12-06 DIAGNOSIS — I10 Essential (primary) hypertension: Secondary | ICD-10-CM

## 2023-12-06 DIAGNOSIS — K760 Fatty (change of) liver, not elsewhere classified: Secondary | ICD-10-CM

## 2023-12-06 DIAGNOSIS — G20A1 Parkinson's disease without dyskinesia, without mention of fluctuations: Secondary | ICD-10-CM

## 2023-12-06 DIAGNOSIS — I700 Atherosclerosis of aorta: Secondary | ICD-10-CM | POA: Diagnosis not present

## 2023-12-06 DIAGNOSIS — Z8589 Personal history of malignant neoplasm of other organs and systems: Secondary | ICD-10-CM

## 2023-12-06 DIAGNOSIS — G903 Multi-system degeneration of the autonomic nervous system: Secondary | ICD-10-CM | POA: Diagnosis not present

## 2023-12-06 MED ORDER — BUSPIRONE HCL 5 MG PO TABS
5.0000 mg | ORAL_TABLET | Freq: Every day | ORAL | 1 refills | Status: DC | PRN
Start: 2023-12-06 — End: 2024-01-19

## 2023-12-06 NOTE — Progress Notes (Unsigned)
 Subjective:    Patient ID: Victoria Hanson, female    DOB: March 06, 1951, 73 y.o.   MRN: [id number]  Patient here for  Chief Complaint  Patient presents with   Medical Management of Chronic Issues    HPI Here for a scheduled follow up - follow up regarding her blood pressure and parkinsons disease. Saw Dr Lorelle 11/20/23 - f/u left knee pain. Recommended to continue hinged knee brace. Does feel the brace helps. Breathing stable. No chest pain. No abdominal pain or bowel change reported. Did discuss with her regarding trying to come off xanax . Discussed trial of buspar  instead. She is agreeable. Continues on citalopram .    Past Medical History:  Diagnosis Date   Anxiety    Asthma    Atypical chest pain    a. 2003 Cath: reportedly nl per pt Cloretta);  b. 04/2015 [facility] Admission, neg troponin.   Colon cancer (HCC) 1999   a. 1999: Pt had partial colectomy. Did develop lung metastasis. Had right upper lobectomy in 01/2007 then had associated chemotherapy.   COPD (chronic obstructive pulmonary disease) (HCC)    DDD (degenerative disc disease), lumbosacral    Depression    Echocardiogram abnormal    a. 02/2010 Echo: EF 55-60%, mild LAE, no regional wall motion abnormalities   Elevated transaminase level    a. ? NASH;  b. 06/2015 - nl LFTs.   GERD (gastroesophageal reflux disease)    Hematuria    Holter monitor, abnormal    a. 12/2009 Holter monitoring: NSR, rare PACs and PVCs.   Hypercholesterolemia    Hyperlipidemia    Hypertension    Leaky heart valve    a. Per pt report - no evidence of valvular abnormalities on prior echoes.   Lesion of skin of Right breast 10/20/2017   Lung metastases 2008   a. S/P resection (lobectomy - right)   Morbid obesity (HCC)    Nephrolithiasis    pt denies   Parkinson disease (HCC) 08/2016   [name]   Past Surgical History:  Procedure Laterality Date   BREAST EXCISIONAL BIOPSY Right 10/07/2009   benign   CARDIAC CATHETERIZATION  2003   negative  as per pt report   CHOLECYSTECTOMY  2000   COLON SURGERY     Colon cancer, removed part of large colon   COLONOSCOPY N/A 04/25/2015   Procedure: COLONOSCOPY;  Surgeon: Lamar ONEIDA Holmes, MD;  Location[facility] ENDOSCOPY;  Service: Endoscopy;  Laterality: N/A;   COLONOSCOPY WITH PROPOFOL  N/A 06/14/2017   Procedure: COLONOSCOPY WITH PROPOFOL ;  Surgeon: Holmes Lamar ONEIDA, MD;  Location: [facility] ENDOSCOPY;  Service: Endoscopy;  Laterality: N/A;   COLONOSCOPY WITH PROPOFOL  N/A 11/17/2019   Procedure: COLONOSCOPY WITH PROPOFOL ;  Surgeon: Therisa Bi, MD;  Location: [facility] ENDOSCOPY;  Service: Gastroenterology;  Laterality: N/A;   COLONOSCOPY WITH PROPOFOL  N/A 03/21/2021   Procedure: COLONOSCOPY WITH PROPOFOL ;  Surgeon: Maryruth Ole ONEIDA, MD;  Location: [facility] ENDOSCOPY;  Service: Endoscopy;  Laterality: N/A;   ESOPHAGOGASTRODUODENOSCOPY (EGD) WITH PROPOFOL  N/A 03/21/2021   Procedure: ESOPHAGOGASTRODUODENOSCOPY (EGD) WITH PROPOFOL ;  Surgeon: Maryruth Ole ONEIDA, MD;  Location: [facility] ENDOSCOPY;  Service: Endoscopy;  Laterality: N/A;   LOBECTOMY  01/2007   Right lung, upper lobe right side removed   REPLACEMENT TOTAL KNEE Left 08/2020   TOTAL ABDOMINAL HYSTERECTOMY     abnormal bleeding   TUBAL LIGATION  1980   Family History  Problem Relation Age of Onset   Coronary artery disease Mother  died @ 15 of MI.   Mental illness Mother    Diabetes Mother    Heart attack Father 26       MI   Cancer Father        lung - died in his mid-70's   Hypertension Father    Diabetes Father    Sudden death Brother    Mental illness Brother    Heart attack Brother        MI @ 35, died @ age 67.   Hypertension Child    Breast cancer Neg Hx    Social History   Socioeconomic History   Marital status: Widowed    Spouse name: Not on file   Number of children: Not on file   Years of education: Not on file   Highest education level: Not on file  Occupational History   Not on file  Tobacco Use   Smoking  status: Never    Passive exposure: Yes   Smokeless tobacco: Never   Tobacco comments:    husband and son smoked in her home.  Vaping Use   Vaping status: Never Used  Substance and Sexual Activity   Alcohol use: Not Currently    Alcohol/week: 0.0 standard drinks of alcohol   Drug use: No   Sexual activity: Never  Other Topics Concern   Not on file  Social History Narrative   Widowed.   Disabled (colon/lung CA), retired.   Lives in [place] with her dtr, grand-dtr, and son.   Active.   Gets regular exercise, walks.   Right handed    Social Drivers of Health   Financial Resource Strain: Patient Declined (11/20/2023)   Received from [facility]   Overall Financial Resource Strain (CARDIA)    Difficulty of Paying Living Expenses: Patient declined  Food Insecurity: Patient Declined (11/20/2023)   Received from [facility]   Hunger Vital Sign    Within the past 12 months, you worried that your food would run out before you got the money to buy more.: Patient declined    Within the past 12 months, the food you bought just didn't last and you didn't have money to get more.: Patient declined  Transportation Needs: Patient Declined (11/20/2023)   Received from [facility] - Transportation    In the past 12 months, has lack of transportation kept you from medical appointments or from getting medications?: Patient declined    Lack of Transportation (Non-Medical): Patient declined  Physical Activity: Inactive (01/07/2023)   Exercise Vital Sign    Days of Exercise per Week: 0 days    Minutes of Exercise per Session: 0 min  Stress: Stress Concern Present (01/07/2023)   [employer] of Occupational Health - Occupational Stress Questionnaire    Feeling of Stress : Rather much  Social Connections: Moderately Integrated (01/07/2023)   Social Connection and Isolation Panel    Frequency of Communication with Friends and  Family: More than three times a week    Frequency of Social Gatherings with Friends and Family: More than three times a week    Attends Religious Services: More than 4 times per year    Active Member of [employer] or Organizations: Yes    Attends Banker Meetings: More than 4 times per year    Marital Status: Widowed     Review of Systems  Constitutional:  Negative for appetite change and unexpected weight change.  HENT:  Negative  for congestion and sinus pressure.   Respiratory:  Negative for cough, chest tightness and shortness of breath.   Cardiovascular:  Negative for chest pain and palpitations.       No increased swelling. Stable./improved.   Gastrointestinal:  Negative for abdominal pain, diarrhea, nausea and vomiting.  Genitourinary:  Negative for difficulty urinating and dysuria.  Musculoskeletal:  Negative for myalgias.       Knee issues as outlined.   Skin:  Negative for color change and rash.  Neurological:  Negative for dizziness and headaches.  Psychiatric/Behavioral:  Negative for agitation and dysphoric mood.        Some intermittent increased anxiety.        Objective:     BP 128/76   Pulse 80   Temp 98.3 F (36.8 C)   Resp 16   Ht 5' 4 (1.626 m)   Wt 180 lb 12.8 oz (82 kg)   SpO2 98%   BMI 31.03 kg/m  Wt Readings from Last 3 Encounters:  12/06/23 180 lb 12.8 oz (82 kg)  10/18/23 176 lb 9.6 oz (80.1 kg)  09/23/23 180 lb (81.6 kg)    Physical Exam Vitals reviewed.  Constitutional:      General: She is not in acute distress.    Appearance: Normal appearance.  HENT:     Head: Normocephalic and atraumatic.     Right Ear: External ear normal.     Left Ear: External ear normal.     Mouth/Throat:     Pharynx: No oropharyngeal exudate or posterior oropharyngeal erythema.   Eyes:     General: No scleral icterus.       Right eye: No discharge.        Left eye: No discharge.     Conjunctiva/sclera: Conjunctivae normal.   Neck:      Thyroid : No thyromegaly.   Cardiovascular:     Rate and Rhythm: Normal rate and regular rhythm.  Pulmonary:     Effort: No respiratory distress.     Breath sounds: Normal breath sounds. No wheezing.  Abdominal:     General: Bowel sounds are normal.     Palpations: Abdomen is soft.     Tenderness: There is no abdominal tenderness.   Musculoskeletal:        General: No tenderness.     Cervical back: Neck supple. No tenderness.     Comments: No increased swelling.   Lymphadenopathy:     Cervical: No cervical adenopathy.   Skin:    Findings: No erythema or rash.   Neurological:     Mental Status: She is alert.   Psychiatric:        Mood and Affect: Mood normal.        Behavior: Behavior normal.         Outpatient Encounter Medications as of 12/06/2023  Medication Sig   busPIRone  (BUSPAR ) 5 MG tablet Take 1 tablet (5 mg total) by mouth daily as needed.   albuterol  (VENTOLIN  HFA) 108 (90 Base) MCG/ACT inhaler INHALE 2 INHALATIONS BY MOUTH INTO THE LUNGS EVERY 6 HOURS AS NEEDED FOR WHEEZING   ALPRAZolam  (XANAX ) 0.5 MG tablet TAKE 1/2 TABLET BY MOUTH ONCE DAILY AS NEEDED   AMBULATORY NON FORMULARY MEDICATION Abdominal compression binder   aspirin  EC 81 MG tablet Take 81 mg by mouth as needed.    Carbidopa -Levodopa  ER (SINEMET  CR) 25-100 MG tablet controlled release Take 2 at 7 AM, 2 at 11 AM, 1 at 4 PM and 2 tablets  at bedtime .   cholecalciferol (VITAMIN D3) 25 MCG (1000 UNIT) tablet Take 1,000 Units by mouth daily.   citalopram  (CELEXA ) 20 MG tablet Take 1 tablet (20 mg total) by mouth daily.   fluticasone -salmeterol (ADVAIR  DISKUS) 250-50 MCG/ACT AEPB Inhale 1 puff into the lungs in the morning and at bedtime.   montelukast  (SINGULAIR ) 10 MG tablet TAKE 1 TABLET BY MOUTH AT BEDTIME   Spacer/Aero-Holding Chambers DEVI 1 Units by Does not apply route daily.   traZODone  (DESYREL ) 50 MG tablet TAKE 2 TABLETS BY MOUTH AT BEDTIME   vitamin B-12 (CYANOCOBALAMIN ) 100 MCG tablet Take  100 mcg by mouth daily.   vitamin E 180 MG (400 UNITS) capsule Take 400 Units by mouth daily.   No facility-administered encounter medications on file as of 12/06/2023.     Lab Results  Component Value Date   WBC 8.5 07/15/2023   HGB 12.3 07/15/2023   HCT 37.1 07/15/2023   PLT 248.0 07/15/2023   GLUCOSE 114 (H) 12/06/2023   CHOL 188 12/06/2023   TRIG 148 12/06/2023   HDL 56 12/06/2023   LDLDIRECT 163.4 12/31/2012   LDLCALC 106 (H) 12/06/2023   ALT 4 12/06/2023   AST 8 12/06/2023   NA 140 12/06/2023   K 4.5 12/06/2023   CL 103 12/06/2023   CREATININE 0.97 12/06/2023   BUN 17 12/06/2023   CO2 21 12/06/2023   TSH 1.80 04/16/2023   INR 1.0 12/12/2012   HGBA1C 5.7 (H) 12/06/2023    DG Tibia/Fibula Left Result Date: 08/17/2023 CLINICAL DATA:  Swelling, fall. EXAM: LEFT TIBIA AND FIBULA - 2 VIEW COMPARISON:  None Available. FINDINGS: There is no evidence of fracture or other focal bone lesions. No erosive change. Knee arthroplasty is intact. Ankle alignment is maintained. Generalized soft tissue edema. IMPRESSION: 1. Generalized soft tissue edema. 2. No acute osseous findings.  Intact knee arthroplasty. Electronically Signed   By: Andrea Gasman M.D.   On: 08/17/2023 16:41   US  Venous Img Lower Unilateral Left Result Date: 08/17/2023 CLINICAL DATA:  Swelling, fall. EXAM: LEFT LOWER EXTREMITY VENOUS DOPPLER ULTRASOUND TECHNIQUE: Gray-scale sonography with compression, as well as color and duplex ultrasound, were performed to evaluate the deep venous system(s) from the level of the common femoral vein through the popliteal and proximal calf veins. COMPARISON:  Left lower extremity ultrasound 01/30/2022 FINDINGS: VENOUS Normal compressibility of the common femoral, superficial femoral, and popliteal veins, as well as the visualized calf veins. Visualized portions of profunda femoral vein and great saphenous vein unremarkable. No filling defects to suggest DVT on grayscale or color Doppler  imaging. Doppler waveforms show normal direction of venous flow, normal respiratory plasticity and response to augmentation. Limited views of the contralateral common femoral vein are unremarkable. OTHER None. Limitations: none IMPRESSION: No evidence of left lower extremity DVT. Electronically Signed   By: Andrea Gasman M.D.   On: 08/17/2023 16:39       Assessment & Plan:  Hypercholesterolemia Assessment & Plan: Off crestor . Has desired not to take. Low cholesterol diet and exercise. Follow lipid panel.   Orders: -     Hepatic function panel -     Lipid panel  Hyperglycemia Assessment & Plan: Low carb diet and exercise. Follow met b and A1c.   Orders: -     Hemoglobin A1c  Primary hypertension Assessment & Plan: Off blood pressure medication due to low pressures. Blood pressure doing well on no medication. Follow pressures.  Check metabolic panel.   Orders: -  Basic metabolic panel with GFR  Anemia, unspecified type Assessment & Plan: Hgb 07/15/23 - wnl.    Aortic atherosclerosis (HCC) Assessment & Plan: Off cholesterol medication. Continue aspirin .    Bilateral carotid artery disease, unspecified type (HCC) Assessment & Plan: Off cholesterol medication. Blood pressure doing well. Continue aspirin .    Stage 3 chronic kidney disease, unspecified whether stage 3a or 3b CKD (HCC) Assessment & Plan: Continue to avoid antiinflammatory medication. Off micardis  due to low pressures. Blood pressure as outlined. No additional medication at this time. Follow pressures. Follow metabolic panel.    Depression, major, single episode, mild (HCC) Assessment & Plan: Continues on citalopram . Discussed coming off xanax . Agreeable to a trial of buspar . Follow.    Fibromuscular dysplasia (HCC) Assessment & Plan: Has been evaluated by [facility].  Continue risk factor modification. Continue daily aspirin .   Hepatic steatosis Assessment & Plan: Diet and exercise.  Seen on chest CT  angio - 09/11/21. Follow liver panel.    History of colon cancer Assessment & Plan:  Saw GI 12/20/22 - f/u colon cancer and anemia.  Has desired no f/u colonoscopy.    History of malignant neoplasm metastatic to lung Assessment & Plan: S/p lobectomy.  Followed by pulmonary.  Albuterol  inhaler if needed. Breathing stable.    Knee pain, unspecified chronicity, unspecified laterality Assessment & Plan: Saw Dr Lorelle 11/20/23 - f/u left knee pain. Recommended to continue hinged knee brace. Does feel the brace helps.   Lower extremity edema Assessment & Plan: Swelling improved from previous checks. Follow.    Orthostatic hypotension due to Parkinson's disease [facility]) Assessment & Plan: Off blood pressure medication. Have discussed the need to wear the compression binder. Follow.    Parkinson's disease, unspecified whether dyskinesia present, unspecified whether manifestations fluctuate (HCC) Assessment & Plan: Followed by Dr Evonnie. Continues on sinemet . Overall appears to be doing better. Discussed the need to wear compression binder regularly. Follow.    Other orders -     busPIRone  HCl; Take 1 tablet (5 mg total) by mouth daily as needed.  Dispense: 30 tablet; Refill: 1     Allena Hamilton, MD

## 2023-12-07 LAB — HEPATIC FUNCTION PANEL
ALT: 4 IU/L (ref 0–32)
AST: 8 IU/L (ref 0–40)
Albumin: 3.8 g/dL (ref 3.8–4.8)
Alkaline Phosphatase: 89 IU/L (ref 44–121)
Bilirubin Total: <0.2 mg/dL (ref 0.0–1.2)
Bilirubin, Direct: <0.08 mg/dL (ref 0.00–0.40)
Total Protein: 6 g/dL (ref 6.0–8.5)

## 2023-12-07 LAB — BASIC METABOLIC PANEL WITH GFR
BUN/Creatinine Ratio: 18 (ref 12–28)
BUN: 17 mg/dL (ref 8–27)
CO2: 21 mmol/L (ref 20–29)
Calcium: 9.1 mg/dL (ref 8.7–10.3)
Chloride: 103 mmol/L (ref 96–106)
Creatinine, Ser: 0.97 mg/dL (ref 0.57–1.00)
Glucose: 114 mg/dL — ABNORMAL HIGH (ref 70–99)
Potassium: 4.5 mmol/L (ref 3.5–5.2)
Sodium: 140 mmol/L (ref 134–144)
eGFR: 61 mL/min/{1.73_m2} (ref >59)

## 2023-12-07 LAB — LIPID PANEL
Chol/HDL Ratio: 3.4 ratio (ref 0.0–4.4)
Cholesterol, Total: 188 mg/dL (ref 100–199)
HDL: 56 mg/dL (ref >39)
LDL Chol Calc (NIH): 106 mg/dL — ABNORMAL HIGH (ref 0–99)
Triglycerides: 148 mg/dL (ref 0–149)
VLDL Cholesterol Cal: 26 mg/dL (ref 5–40)

## 2023-12-07 LAB — HEMOGLOBIN A1C
Est. average glucose Bld gHb Est-mCnc: 117 mg/dL
Hgb A1c MFr Bld: 5.7 % — ABNORMAL HIGH (ref 4.8–5.6)

## 2023-12-08 ENCOUNTER — Encounter: Payer: Self-pay | Admitting: Internal Medicine

## 2023-12-08 ENCOUNTER — Ambulatory Visit: Payer: Self-pay | Admitting: Internal Medicine

## 2023-12-08 NOTE — Assessment & Plan Note (Signed)
 Off cholesterol medication. Blood pressure doing well. Continue aspirin .

## 2023-12-08 NOTE — Assessment & Plan Note (Signed)
 Off blood pressure medication due to low pressures. Blood pressure doing well on no medication. Follow pressures.  Check metabolic panel.

## 2023-12-08 NOTE — Assessment & Plan Note (Signed)
 Continues on citalopram . Discussed coming off xanax . Agreeable to a trial of buspar . Follow.

## 2023-12-08 NOTE — Assessment & Plan Note (Signed)
 S/p lobectomy.  Followed by pulmonary.  Albuterol inhaler if needed. Breathing stable.

## 2023-12-08 NOTE — Assessment & Plan Note (Signed)
 Off cholesterol medication. Continue aspirin .

## 2023-12-08 NOTE — Assessment & Plan Note (Signed)
 Diet and exercise.  Seen on chest CT angio - 09/11/21. Follow liver panel.

## 2023-12-08 NOTE — Assessment & Plan Note (Signed)
 Off crestor . Has desired not to take. Low cholesterol diet and exercise. Follow lipid panel.

## 2023-12-08 NOTE — Assessment & Plan Note (Signed)
 Followed by Dr Winferd Hatter. Continues on sinemet . Overall appears to be doing better. Discussed the need to wear compression binder regularly. Follow.

## 2023-12-08 NOTE — Assessment & Plan Note (Signed)
 Hgb 07/15/23 - wnl.

## 2023-12-08 NOTE — Assessment & Plan Note (Signed)
 Saw GI 12/20/22 - f/u colon cancer and anemia.  Has desired no f/u colonoscopy.

## 2023-12-08 NOTE — Assessment & Plan Note (Signed)
 Off blood pressure medication. Have discussed the need to wear the compression binder. Follow.

## 2023-12-08 NOTE — Assessment & Plan Note (Signed)
 Swelling improved from previous checks. Follow.

## 2023-12-08 NOTE — Assessment & Plan Note (Signed)
 Low-carb diet and exercise.  Follow met b and A1c.

## 2023-12-08 NOTE — Assessment & Plan Note (Signed)
 Has been evaluated by [facility].  Continue risk factor modification. Continue daily aspirin .

## 2023-12-08 NOTE — Assessment & Plan Note (Signed)
 Saw Dr Lorelle 11/20/23 - f/u left knee pain. Recommended to continue hinged knee brace. Does feel the brace helps.

## 2023-12-08 NOTE — Assessment & Plan Note (Signed)
 Continue to avoid antiinflammatory medication. Off micardis  due to low pressures. Blood pressure as outlined. No additional medication at this time. Follow pressures. Follow metabolic panel.

## 2023-12-16 ENCOUNTER — Telehealth: Payer: Self-pay

## 2023-12-16 NOTE — Telephone Encounter (Signed)
 Copied from CRM [id number]. Topic: Appointments - Scheduling Inquiry for Clinic >> Dec 16, 2023  1:26 PM Victoria Hanson wrote: Reason for CRM: Patient is calling in to confirm her 07/22 appointment, but it has a reschedule icon next to it. When rescheduling for an afternoon appointment the first available is the end of September but the patient was told to come back in 3 weeks, patient is asking if anything can be done to have this sooner then September and also wanted to verify if Victoria Hanson was really going to be out that day. Please advise the patient.  I spoke with patient and let her know that Dr. Allena Hanson will be in the office on 12/31/2023.  Patient states she is ok to keep her appointment with Victoria Hanson on 12/31/2023 at 4pm.

## 2023-12-16 NOTE — Telephone Encounter (Signed)
 noted

## 2023-12-31 ENCOUNTER — Ambulatory Visit: Admitting: Internal Medicine

## 2023-12-31 NOTE — Progress Notes (Deleted)
 Subjective:    Patient ID: Victoria Hanson, female    DOB: 1951-02-06, 73 y.o.   MRN: [id number]  Patient here for No chief complaint on file.   HPI Here for a scheduled follow up - follow up regarding her blood pressure and parkinsons disease. Saw Dr Lorelle 11/20/23 - f/u left knee pain. Recommended to continue hinged knee brace. Does feel the brace helps  last visit, discussed increased stress/anxeity and goal to for her to come off xanax . Discussed a trial of buspar .    Past Medical History:  Diagnosis Date   Anxiety    Asthma    Atypical chest pain    a. 2003 Cath: reportedly nl per pt Cloretta);  b. 04/2015 [facility] Admission, neg troponin.   Colon cancer (HCC) 1999   a. 1999: Pt had partial colectomy. Did develop lung metastasis. Had right upper lobectomy in 01/2007 then had associated chemotherapy.   COPD (chronic obstructive pulmonary disease) (HCC)    DDD (degenerative disc disease), lumbosacral    Depression    Echocardiogram abnormal    a. 02/2010 Echo: EF 55-60%, mild LAE, no regional wall motion abnormalities   Elevated transaminase level    a. ? NASH;  b. 06/2015 - nl LFTs.   GERD (gastroesophageal reflux disease)    Hematuria    Holter monitor, abnormal    a. 12/2009 Holter monitoring: NSR, rare PACs and PVCs.   Hypercholesterolemia    Hyperlipidemia    Hypertension    Leaky heart valve    a. Per pt report - no evidence of valvular abnormalities on prior echoes.   Lesion of skin of Right breast 10/20/2017   Lung metastases 2008   a. S/P resection (lobectomy - right)   Morbid obesity (HCC)    Nephrolithiasis    pt denies   Parkinson disease (HCC) 08/2016   [name]   Past Surgical History:  Procedure Laterality Date   BREAST EXCISIONAL BIOPSY Right 10/07/2009   benign   CARDIAC CATHETERIZATION  2003   negative as per pt report   CHOLECYSTECTOMY  2000   COLON SURGERY     Colon cancer, removed part of large colon   COLONOSCOPY N/A 04/25/2015   Procedure:  COLONOSCOPY;  Surgeon: Lamar ONEIDA Holmes, MD;  Location: [facility] ENDOSCOPY;  Service: Endoscopy;  Laterality: N/A;   COLONOSCOPY WITH PROPOFOL  N/A 06/14/2017   Procedure: COLONOSCOPY WITH PROPOFOL ;  Surgeon: Holmes Lamar ONEIDA, MD;  Location: [facility] ENDOSCOPY;  Service: Endoscopy;  Laterality: N/A;   COLONOSCOPY WITH PROPOFOL  N/A 11/17/2019   Procedure: COLONOSCOPY WITH PROPOFOL ;  Surgeon: Therisa Bi, MD;  Location: [facility] ENDOSCOPY;  Service: Gastroenterology;  Laterality: N/A;   COLONOSCOPY WITH PROPOFOL  N/A 03/21/2021   Procedure: COLONOSCOPY WITH PROPOFOL ;  Surgeon: Maryruth Ole ONEIDA, MD;  Location: [facility] ENDOSCOPY;  Service: Endoscopy;  Laterality: N/A;   ESOPHAGOGASTRODUODENOSCOPY (EGD) WITH PROPOFOL  N/A 03/21/2021   Procedure: ESOPHAGOGASTRODUODENOSCOPY (EGD) WITH PROPOFOL ;  Surgeon: Maryruth Ole ONEIDA, MD;  Location: [facility] ENDOSCOPY;  Service: Endoscopy;  Laterality: N/A;   LOBECTOMY  01/2007   Right lung, upper lobe right side removed   REPLACEMENT TOTAL KNEE Left 08/2020   TOTAL ABDOMINAL HYSTERECTOMY     abnormal bleeding   TUBAL LIGATION  1980   Family History  Problem Relation Age of Onset   Coronary artery disease Mother        died @ 76 of MI.   Mental illness Mother    Diabetes Mother    Heart attack Father 62  MI   Cancer Father        lung - died in his mid-70's   Hypertension Father    Diabetes Father    Sudden death Brother    Mental illness Brother    Heart attack Brother        MI @ 27, died @ age 20.   Hypertension Child    Breast cancer Neg Hx    Social History   Socioeconomic History   Marital status: Widowed    Spouse name: Not on file   Number of children: Not on file   Years of education: Not on file   Highest education level: Not on file  Occupational History   Not on file  Tobacco Use   Smoking status: Never    Passive exposure: Yes   Smokeless tobacco: Never   Tobacco comments:    husband and son smoked in her home.  Vaping Use   Vaping  status: Never Used  Substance and Sexual Activity   Alcohol use: Not Currently    Alcohol/week: 0.0 standard drinks of alcohol   Drug use: No   Sexual activity: Never  Other Topics Concern   Not on file  Social History Narrative   Widowed.   Disabled (colon/lung CA), retired.   Lives in [place] with her dtr, grand-dtr, and son.   Active.   Gets regular exercise, walks.   Right handed    Social Drivers of Health   Financial Resource Strain: Patient Declined (11/20/2023)   Received [facility]   Overall Financial Resource Strain (CARDIA)    Difficulty of Paying Living Expenses: Patient declined  Food Insecurity: Patient Declined (11/20/2023)   [facility]   Hunger Vital Sign    Within the past 12 months, you worried that your food would run out before you got the money to buy more.: Patient declined    Within the past 12 months, the food you bought just didn't last and you didn't have money to get more.: Patient declined  Transportation Needs: Patient Declined (11/20/2023)   Received from [facility] - [facility]    In the past 12 months, has lack of transportation kept you from medical appointments or from getting medications?: Patient declined    Lack of Transportation (Non-Medical): Patient declined  Physical Activity: Inactive (01/07/2023)   Exercise Vital Sign    Days of Exercise per Week: 0 days    Minutes of Exercise per Session: 0 min  Stress: Stress Concern Present (01/07/2023)   [employer] of Occupational Health - Occupational Stress Questionnaire    Feeling of Stress : Rather much  Social Connections: Moderately Integrated (01/07/2023)   Social Connection and Isolation Panel    Frequency of Communication with Friends and Family: More than three times a week    Frequency of Social Gatherings with Friends and Family: More than three times a week    Attends Religious  Services: More than 4 times per year    Active Member of [employer] or Organizations: Yes    Attends Banker Meetings: More than 4 times per year    Marital Status: Widowed     Review of Systems     Objective:     There were no vitals taken for this visit. Wt Readings from Last 3 Encounters:  12/06/23 180 lb 12.8 oz (82 kg)  10/18/23 176 lb 9.6 oz (80.1 kg)  09/23/23 180 lb (  81.6 kg)    Physical Exam  {Perform Simple Foot Exam  Perform Detailed exam:1} {Insert foot Exam (Optional):30965}   Outpatient Encounter Medications as of 12/31/2023  Medication Sig   albuterol  (VENTOLIN  HFA) 108 (90 Base) MCG/ACT inhaler INHALE 2 INHALATIONS BY MOUTH INTO THE LUNGS EVERY 6 HOURS AS NEEDED FOR WHEEZING   ALPRAZolam  (XANAX ) 0.5 MG tablet TAKE 1/2 TABLET BY MOUTH ONCE DAILY AS NEEDED   AMBULATORY NON FORMULARY MEDICATION Abdominal compression binder   aspirin  EC 81 MG tablet Take 81 mg by mouth as needed.    busPIRone  (BUSPAR ) 5 MG tablet Take 1 tablet (5 mg total) by mouth daily as needed.   Carbidopa -Levodopa  ER (SINEMET  CR) 25-100 MG tablet controlled release Take 2 at 7 AM, 2 at 11 AM, 1 at 4 PM and 2 tablets at bedtime .   cholecalciferol (VITAMIN D3) 25 MCG (1000 UNIT) tablet Take 1,000 Units by mouth daily.   citalopram  (CELEXA ) 20 MG tablet Take 1 tablet (20 mg total) by mouth daily.   fluticasone -salmeterol (ADVAIR  DISKUS) 250-50 MCG/ACT AEPB Inhale 1 puff into the lungs in the morning and at bedtime.   montelukast  (SINGULAIR ) 10 MG tablet TAKE 1 TABLET BY MOUTH AT BEDTIME   Spacer/Aero-Holding Chambers DEVI 1 Units by Does not apply route daily.   traZODone  (DESYREL ) 50 MG tablet TAKE 2 TABLETS BY MOUTH AT BEDTIME   vitamin B-12 (CYANOCOBALAMIN ) 100 MCG tablet Take 100 mcg by mouth daily.   vitamin E 180 MG (400 UNITS) capsule Take 400 Units by mouth daily.   No facility-administered encounter medications on file as of 12/31/2023.     Lab Results  Component Value Date    WBC 8.5 07/15/2023   HGB 12.3 07/15/2023   HCT 37.1 07/15/2023   PLT 248.0 07/15/2023   GLUCOSE 114 (H) 12/06/2023   CHOL 188 12/06/2023   TRIG 148 12/06/2023   HDL 56 12/06/2023   LDLDIRECT 163.4 12/31/2012   LDLCALC 106 (H) 12/06/2023   ALT 4 12/06/2023   AST 8 12/06/2023   NA 140 12/06/2023   K 4.5 12/06/2023   CL 103 12/06/2023   CREATININE 0.97 12/06/2023   BUN 17 12/06/2023   CO2 21 12/06/2023   TSH 1.80 04/16/2023   INR 1.0 12/12/2012   HGBA1C 5.7 (H) 12/06/2023    DG Tibia/Fibula Left Result Date: 08/17/2023 CLINICAL DATA:  Swelling, fall. EXAM: LEFT TIBIA AND FIBULA - 2 VIEW COMPARISON:  None Available. FINDINGS: There is no evidence of fracture or other focal bone lesions. No erosive change. Knee arthroplasty is intact. Ankle alignment is maintained. Generalized soft tissue edema. IMPRESSION: 1. Generalized soft tissue edema. 2. No acute osseous findings.  Intact knee arthroplasty. Electronically Signed   By: Andrea Gasman M.D.   On: 08/17/2023 16:41   US  Venous Img Lower Unilateral Left Result Date: 08/17/2023 CLINICAL DATA:  Swelling, fall. EXAM: LEFT LOWER EXTREMITY VENOUS DOPPLER ULTRASOUND TECHNIQUE: Gray-scale sonography with compression, as well as color and duplex ultrasound, were performed to evaluate the deep venous system(s) from the level of the common femoral vein through the popliteal and proximal calf veins. COMPARISON:  Left lower extremity ultrasound 01/30/2022 FINDINGS: VENOUS Normal compressibility of the common femoral, superficial femoral, and popliteal veins, as well as the visualized calf veins. Visualized portions of profunda femoral vein and great saphenous vein unremarkable. No filling defects to suggest DVT on grayscale or color Doppler imaging. Doppler waveforms show normal direction of venous flow, normal respiratory plasticity and response to  augmentation. Limited views of the contralateral common femoral vein are unremarkable. OTHER None.  Limitations: none IMPRESSION: No evidence of left lower extremity DVT. Electronically Signed   By: Andrea Gasman M.D.   On: 08/17/2023 16:39       Assessment & Plan:  There are no diagnoses linked to this encounter.   Allena Hamilton, MD

## 2023-12-31 NOTE — Telephone Encounter (Unsigned)
 Copied from CRM [id number]. Topic: Appointments - Scheduling Inquiry for Clinic >> Dec 31, 2023 10:57 AM Mesmerise C wrote: Reason for CRM: Patient requested to see about a sooner appointment than september states she's supposed to be trying a new medication would like a 4pm appointment if possible requested Nurse Sueanne to call her back to discuss appointment  [phone]

## 2023-12-31 NOTE — Telephone Encounter (Signed)
 APPT has been changed to 8/5 at 4pm

## 2024-01-02 ENCOUNTER — Other Ambulatory Visit: Payer: Self-pay | Admitting: Internal Medicine

## 2024-01-02 ENCOUNTER — Ambulatory Visit: Payer: Self-pay

## 2024-01-02 NOTE — Telephone Encounter (Signed)
 Reviewed. Discussed. Per conversation, pt going for evaluation. Please call for f/u to confirm ok and confirm evaluated.

## 2024-01-02 NOTE — Telephone Encounter (Signed)
 FYI Only or Action Required?: Action required by provider: update on patient condition.  Patient was last seen in primary care on 12/06/2023 by Glendia Shad, MD.  Called Nurse Triage reporting Shortness of Breath.  Symptoms began x 2days .  Interventions attempted: Prescription medications: Stopped the Buspar .  Symptoms are: gradually worsening.  Triage Disposition: Go to ED Now (Notify PCP)  Patient/caregiver understands and will follow disposition?: Yes  **See note below**                                    Copied from CRM #[id number]. Topic: Clinical - Red Word Triage >> Jan 02, 2024  4:51 PM Gennette ORN wrote: Red Word that prompted transfer to Nurse Triage: Patient is having panic attacks for the last two days and also has trouble breathing. She thinks it's coming from the medication. Reason for Disposition  [1] MODERATE difficulty breathing (e.g., speaks in phrases, SOB even at rest, pulse 100-120) AND [2] NEW-onset or WORSE than normal  Answer Assessment - Initial Assessment Questions 1. RESPIRATORY STATUS: Describe your breathing? (e.g., wheezing, shortness of breath, unable to speak, severe coughing)      SOB  2. ONSET: When did this breathing problem begin?      X 2 days ago  3. PATTERN Does the difficult breathing come and go, or has it been constant since it started?      Intermittent   4. SEVERITY: How bad is your breathing? (e.g., mild, moderate, severe)      Moderate/Severe- patient speaking in phrases during the call.   5. RECURRENT SYMPTOM: Have you had difficulty breathing before? If Yes, ask: When was the last time? and What happened that time?      No   8. CAUSE: What do you think is causing the breathing problem?      Buspar   9. OTHER SYMPTOMS: Do you have any other symptoms? (e.g., chest pain, cough, dizziness, fever, runny nose)     No    Patient wants to stop the Buspar  and continue with the  Xanax . Her last dose of Buspar  was this morning. She states she doesn't want to continue the medication, and would like to be started on the Xanax  again. Patient took a Xanax  a few minutes before the call and stated it will help with her breathing as it may be related to the recent panic attacks/anxiety. Pt. Was advised if symptoms persist after a few more minutes to call 911, she agrees to call daughter to take her to the ED if symptoms persist and do not calm down from the Xanax .  Protocols used: Breathing Difficulty-A-AH

## 2024-01-02 NOTE — Telephone Encounter (Signed)
 Called pt to see if her SOBr had gotten better since taking the alprazolam . Pt was hard to hear on the phone and unable to speak more than a few words without sounding like she was gasping for air. I asked the pt if I could call her daughter and she said that her daughter was there and I could speak with her. I spoke with Olam to let her know that with her mom sounding like she is gasping for air every few words and pacing the floors that they should go ahead and take pt to the ED to be evaluated and not wait any longer to see if the alprazolam  was going to help. Olam gave a verbal understanding.

## 2024-01-03 NOTE — Telephone Encounter (Signed)
 Agree with holding buspar . Has xanax  if needed. Will d/w her at her appt as discussed.  Per discussion, pt doing much better.

## 2024-01-03 NOTE — Telephone Encounter (Signed)
 Called patient to follow up. She did not go to the ED. She refused. Her daughter told her to take a trazodone  and a xanax  to help her relax. She did and went to sleep and her symptoms have resolved. She is better today . Reports that she cannot take buspar . Per pt, would like to continue to use xanax  prn. Daughter mentioned clonazepam. She has a follow up with you 01/14/24. She is still taking citalopram .

## 2024-01-08 ENCOUNTER — Telehealth: Payer: Self-pay | Admitting: *Deleted

## 2024-01-08 ENCOUNTER — Ambulatory Visit (INDEPENDENT_AMBULATORY_CARE_PROVIDER_SITE_OTHER): Payer: 59 | Admitting: *Deleted

## 2024-01-08 VITALS — Ht 63.0 in | Wt 180.0 lb

## 2024-01-08 DIAGNOSIS — Z0000 Encounter for general adult medical examination without abnormal findings: Secondary | ICD-10-CM

## 2024-01-08 NOTE — Telephone Encounter (Signed)
 Performed AWV Patient stated that she had called in several days ago about stopping her Buspar  because it made her anxiety much worse. Patient stated that she is waiting on a call back regarding her stopping the Buspar .

## 2024-01-08 NOTE — Progress Notes (Signed)
 Subjective:   Victoria Hanson is a 73 y.o. who presents for a Medicare Wellness preventive visit.  As a reminder, Annual Wellness Visits don't include a physical exam, and some assessments may be limited, especially if this visit is performed virtually. We may recommend an in-person follow-up visit with your provider if needed.  Visit Complete: Virtual I connected with  Victoria Hanson on 01/08/24 by a audio enabled telemedicine application and verified that I am speaking with the correct person using two identifiers.  Patient Location: Home  Provider Location: Office/Clinic  I discussed the limitations of evaluation and management by telemedicine. The patient expressed understanding and agreed to proceed.  Vital Signs: Because this visit was a virtual/telehealth visit, some criteria may be missing or patient reported. Any vitals not documented were not able to be obtained and vitals that have been documented are patient reported.  VideoDeclined- This patient declined Librarian, academic. Therefore the visit was completed with audio only.  Persons Participating in Visit: Patient.  AWV Questionnaire: No: Patient Medicare AWV questionnaire was not completed prior to this visit.  Cardiac Risk Factors include: advanced age (>83men, >54 women);dyslipidemia;hypertension;obesity (BMI >30kg/m2)     Objective:    Today's Vitals   01/08/24 1050  Weight: 180 lb (81.6 kg)  Height: 5' 3 (1.6 m)   Body mass index is 31.89 kg/m.     08/17/2023   12:57 PM 08/07/2023   11:00 AM 05/03/2023    1:07 PM 01/31/2023   11:04 AM 01/07/2023   11:05 AM 07/23/2022    8:22 AM 07/12/2022   10:06 AM  Advanced Directives  Does Patient Have a Medical Advance Directive? Yes Yes Yes No Yes Yes Yes  Type of Sales promotion account executive of Attorney Living will  Healthcare Power of [place];Living will Living will;Healthcare Power of Attorney    Does patient want to make changes to medical advance directive?     No - Patient declined  No - Patient declined  Copy of Healthcare Power of Attorney in Chart?     Yes - validated most recent copy scanned in chart (See row information)      Current Medications (verified) Outpatient Encounter Medications as of 01/08/2024  Medication Sig   albuterol  (VENTOLIN  HFA) 108 (90 Base) MCG/ACT inhaler INHALE 2 INHALATIONS BY MOUTH INTO THE LUNGS EVERY 6 HOURS AS NEEDED FOR WHEEZING   ALPRAZolam  (XANAX ) 0.5 MG tablet TAKE 1/2 TABLET BY MOUTH ONCE DAILY AS NEEDED   AMBULATORY NON FORMULARY MEDICATION Abdominal compression binder   aspirin  EC 81 MG tablet Take 81 mg by mouth as needed.    Carbidopa -Levodopa  ER (SINEMET  CR) 25-100 MG tablet controlled release Take 2 at 7 AM, 2 at 11 AM, 1 at 4 PM and 2 tablets at bedtime .   cholecalciferol (VITAMIN D3) 25 MCG (1000 UNIT) tablet Take 1,000 Units by mouth daily.   citalopram  (CELEXA ) 20 MG tablet Take 1 tablet (20 mg total) by mouth daily.   COD LIVER OIL PO Take by mouth as needed.   fluticasone -salmeterol (ADVAIR  DISKUS) 250-50 MCG/ACT AEPB Inhale 1 puff into the lungs in the morning and at bedtime.   montelukast  (SINGULAIR ) 10 MG tablet TAKE 1 TABLET BY MOUTH AT BEDTIME   Spacer/Aero-Holding Chambers DEVI 1 Units by Does not apply route daily.   traZODone  (DESYREL ) 50 MG tablet TAKE 2 TABLETS BY MOUTH AT BEDTIME   vitamin B-12 (CYANOCOBALAMIN ) 100 MCG tablet Take 100 mcg  by mouth daily.   busPIRone  (BUSPAR ) 5 MG tablet Take 1 tablet (5 mg total) by mouth daily as needed. (Patient not taking: Reported on 01/08/2024)   vitamin E 180 MG (400 UNITS) capsule Take 400 Units by mouth daily. (Patient not taking: Reported on 01/08/2024)   No facility-administered encounter medications on file as of 01/08/2024.    Allergies (verified) Macrobid [nitrofurantoin macrocrystal]; Antihistamines, diphenhydramine-type; Aspirin ; Buspar  [buspirone ]; Ceftin [cefuroxime  axetil]; Celecoxib; Cymbalta  [duloxetine  hcl]; Diphenhydramine; Escitalopram ; Lexapro  [escitalopram  oxalate]; Metronidazole; Nitrofurantoin; Zoloft [sertraline hcl]; and Ciprofloxacin    History: Past Medical History:  Diagnosis Date   Anxiety    Asthma    Atypical chest pain    a. 2003 Cath: reportedly nl per pt Cloretta);  b. 04/2015 [facility] Admission, neg troponin.   Colon cancer (HCC) 1999   a. 1999: Pt had partial colectomy. Did develop lung metastasis. Had right upper lobectomy in 01/2007 then had associated chemotherapy.   COPD (chronic obstructive pulmonary disease) (HCC)    DDD (degenerative disc disease), lumbosacral    Depression    Echocardiogram abnormal    a. 02/2010 Echo: EF 55-60%, mild LAE, no regional wall motion abnormalities   Elevated transaminase level    a. ? NASH;  b. 06/2015 - nl LFTs.   GERD (gastroesophageal reflux disease)    Hematuria    Holter monitor, abnormal    a. 12/2009 Holter monitoring: NSR, rare PACs and PVCs.   Hypercholesterolemia    Hyperlipidemia    Hypertension    Leaky heart valve    a. Per pt report - no evidence of valvular abnormalities on prior echoes.   Lesion of skin of Right breast 10/20/2017   Lung metastases 2008   a. S/P resection (lobectomy - right)   Morbid obesity (HCC)    Nephrolithiasis    pt denies   Parkinson disease (HCC) 08/2016   [name]   Past Surgical History:  Procedure Laterality Date   BREAST EXCISIONAL BIOPSY Right 10/07/2009   benign   CARDIAC CATHETERIZATION  2003   negative as per pt report   CHOLECYSTECTOMY  2000   COLON SURGERY     Colon cancer, removed part of large colon   COLONOSCOPY N/A 04/25/2015   Procedure: COLONOSCOPY;  Surgeon: Lamar ONEIDA Holmes, MD;  Location: [facility] ENDOSCOPY;  Service: Endoscopy;  Laterality: N/A;   COLONOSCOPY WITH PROPOFOL  N/A 06/14/2017   Procedure: COLONOSCOPY WITH PROPOFOL ;  Surgeon: Holmes Lamar ONEIDA, MD;  Location[facility] END[facility]COPY;  Service: Endoscopy;  Laterality: N/A;    COLONOSCOPY WITH PROPOFOL  N/A 11/17/2019   Procedure: COLONOSCOPY WITH PROPOFOL ;  Surgeon: Therisa Bi, MD;  Location: [facility] ENDOSCOPY;  Service: Gastroenterology;  Laterality: N/A;   COLONOSCOPY WITH PROPOFOL  N/A 03/21/2021   Procedure: COLONOSCOPY WITH PROPOFOL ;  Surgeon: Maryruth Ole ONEIDA, MD;  Location: [facility] ENDOSCOPY;  Service: Endoscopy;  Laterality: N/A;   ESOPHAGOGASTRODUODENOSCOPY (EGD) WITH PROPOFOL  N/A 03/21/2021   Procedure: ESOPHAGOGASTRODUODENOSCOPY (EGD) WITH PROPOFOL ;  Surgeon: Maryruth Ole ONEIDA, MD;  Location: [facility] ENDOSCOPY;  Service: Endoscopy;  Laterality: N/A;   LOBECTOMY  01/2007   Right lung, upper lobe right side removed   REPLACEMENT TOTAL KNEE Left 08/2020   TOTAL ABDOMINAL HYSTERECTOMY     abnormal bleeding   TUBAL LIGATION  1980   Family History  Problem Relation Age of Onset   Coronary artery disease Mother        died @ 74 of MI.   Mental illness Mother    Diabetes Mother    Heart attack Father  65       MI   Cancer Father        lung - died in his mid-70's   Hypertension Father    Diabetes Father    Sudden death Brother    Mental illness Brother    Heart attack Brother        MI @ 31, died @ age 41.   Hypertension Child    Breast cancer Neg Hx    Social History   Socioeconomic History   Marital status: Widowed    Spouse name: Not on file   Number of children: Not on file   Years of education: Not on file   Highest education level: Not on file  Occupational History   Not on file  Tobacco Use   Smoking status: Never    Passive exposure: Yes   Smokeless tobacco: Never   Tobacco comments:    husband and son smoked in her home.  Vaping Use   Vaping status: Never Used  Substance and Sexual Activity   Alcohol use: Not Currently    Alcohol/week: 0.0 standard drinks of alcohol   Drug use: No   Sexual activity: Never  Other Topics Concern   Not on file  Social History Narrative   Widowed.   Disabled (colon/lung CA), retired.    Lives in [place] with her dtr, grand-dtr, and son.   Active.   Gets regular exercise, walks.   Right handed    Social Drivers of Health   Financial Resource Strain: Low Risk  (01/08/2024)   Overall Financial Resource Strain (CARDIA)    Difficulty of Paying Living Expenses: Not hard at all  Food Insecurity: No Food Insecurity (01/08/2024)   Hunger Vital Sign    Worried About Running Out of Food in the Last Year: Never true    Ran Out of Food in the Last Year: Never true  Transportation Needs: No Transportation Needs (01/08/2024)   PRAPARE - Administrator, Civil Service (Medical): No    Lack of Transportation (Non-Medical): No  Physical Activity: Inactive (01/08/2024)   Exercise Vital Sign    Days of Exercise per Week: 0 days    Minutes of Exercise per Session: 0 min  Stress: Stress Concern Present (01/08/2024)   [employer] of Occupational Health - Occupational Stress Questionnaire    Feeling of Stress: To some extent  Social Connections: Moderately Integrated (01/08/2024)   Social Connection and Isolation Panel    Frequency of Communication with Friends and Family: More than three times a week    Frequency of Social Gatherings with Friends and Family: Three times a week    Attends Religious Services: More than 4 times per year    Active Member of Clubs or Organizations: Yes    Attends Banker Meetings: More than 4 times per year    Marital Status: Widowed    Tobacco Counseling Counseling given: Not Answered Tobacco comments: husband and son smoked in her home.    Clinical Intake:  Pre-visit preparation completed: Yes  Pain : No/denies pain     BMI - recorded: 31.89 Nutritional Status: BMI > 30  Obese Nutritional Risks: None Diabetes: No  Lab Results  Component Value Date   HGBA1C 5.7 (H) 12/06/2023   HGBA1C 5.9 08/08/2023   HGBA1C 5.8 03/14/2023     How often do you need to have someone help you when you read instructions,  pamphlets, or other written materials from your doctor or  pharmacy?: 1 - Never  Interpreter Needed?: No  Information entered by :: R. Tyrique Sporn LPN   Activities of Daily Living     01/08/2024   10:52 AM  In your present state of health, do you have any difficulty performing the following activities:  Hearing? 0  Vision? 0  Difficulty concentrating or making decisions? 1  Walking or climbing stairs? 1  Dressing or bathing? 0  Doing errands, shopping? 1  Preparing Food and eating ? N  Using the Toilet? N  In the past six months, have you accidently leaked urine? N  Do you have problems with loss of bowel control? N  Managing your Medications? N  Managing your Finances? N  Housekeeping or managing your Housekeeping? Y  Comment knee problem    Patient Care Team: Glendia Shad, MD as PCP - General (Unknown Physician Specialty) Dann Candyce RAMAN, MD as PCP - Cardiology (Cardiology) Viktoria Lamar DASEN, MD (Inactive) (Gastroenterology) Tat, Asberry RAMAN, DO as Consulting Physician (Neurology)  I have updated your Care Teams any recent Medical Services you may have received from other providers in the past year.     Assessment:   This is a routine wellness examination for Victoria Hanson.  Hearing/Vision screen Hearing Screening - Comments:: No issues Vision Screening - Comments:: glasses   Goals Addressed             This Visit's Progress    Patient Stated       Wants to stay active        Depression Screen     01/08/2024   11:01 AM 10/18/2023    4:08 PM 08/23/2023    8:05 AM 05/15/2023   11:30 AM 04/16/2023    2:35 PM 01/07/2023   10:57 AM 10/05/2022    1:11 PM  PHQ 2/9 Scores  PHQ - 2 Score 2 2 1  0 0 1 0  PHQ- 9 Score 4 6 6 4 2 4  0    Fall Risk     01/08/2024   10:54 AM 10/18/2023    4:08 PM 08/23/2023    8:04 AM 08/07/2023   10:59 AM 05/15/2023   11:30 AM  Fall Risk   Falls in the past year? 1 1 1 1  0  Number falls in past yr: 0 0 0 1 0  Injury with Fall? 1 1 1 1  0   Risk for fall due to : History of fall(s);Impaired balance/gait History of fall(s) No Fall Risks    Follow up Falls evaluation completed;Falls prevention discussed Falls evaluation completed  Falls evaluation completed Falls evaluation completed    MEDICARE RISK AT HOME:  Medicare Risk at Home Any stairs in or around the home?: Yes If so, are there any without handrails?: Yes Home free of loose throw rugs in walkways, pet beds, electrical cords, etc?: Yes Adequate lighting in your home to reduce risk of falls?: Yes Life alert?: No Use of a cane, walker or w/c?: No Grab bars in the bathroom?: Yes Shower chair or bench in shower?: Yes Elevated toilet seat or a handicapped toilet?: Yes  TIMED UP AND GO:  Was the test performed?  No  Cognitive Function: 6CIT completed        01/08/2024   11:06 AM 01/07/2023   11:05 AM 11/22/2020   10:16 AM 11/20/2019    9:56 AM 11/19/2018    9:55 AM  6CIT Screen  What Year? 0 points 0 points 0 points 0 points 0 points  What month? 0  points 0 points 0 points 0 points 0 points  What time? 0 points 0 points 0 points 0 points 0 points  Count back from 20 0 points 0 points 0 points 0 points 0 points  Months in reverse 0 points 0 points 0 points 0 points 0 points  Repeat phrase 2 points 6 points  2 points 0 points  Total Score 2 points 6 points  2 points 0 points    Immunizations Immunization History  Administered Date(s) Administered   Fluad Quad(high Dose 65+) 02/26/2019, 02/24/2020, 03/09/2022   Fluad Trivalent(High Dose 65+) 03/14/2023   Influenza, High Dose Seasonal PF 04/17/2017, 04/16/2018   Influenza,inj,Quad PF,6+ Mos 04/02/2013, 04/07/2014, 03/15/2015, 02/03/2016   Moderna Sars-Covid-2 Vaccination 12/03/2019, 01/02/2020   Pneumococcal Conjugate-13 01/11/2015   Pneumococcal Polysaccharide-23 01/14/2017   Tdap 01/08/2020    Screening Tests Health Maintenance  Topic Date Due   COVID-19 Vaccine (3 - Moderna risk series) 01/30/2020    Medicare Annual Wellness (AWV)  01/07/2024   Zoster Vaccines- Shingrix (1 of 2) 02/07/2024 (Originally 11/17/1969)   INFLUENZA VACCINE  01/10/2024   Colonoscopy  03/21/2026   DTaP/Tdap/Td (2 - Td or Tdap) 01/07/2030   Pneumococcal Vaccine: 50+ Years  Completed   DEXA SCAN  Completed   Hepatitis C Screening  Completed   Hepatitis B Vaccines  Aged Out   HPV VACCINES  Aged Out   Meningococcal B Vaccine  Aged Out    Health Maintenance  Health Maintenance Due  Topic Date Due   COVID-19 Vaccine (3 - Moderna risk series) 01/30/2020   Medicare Annual Wellness (AWV)  01/07/2024   Health Maintenance Items Addressed:Additional Screening: Discussed the need to update vaccines which patient declines at this time  Vision Screening: Recommended annual ophthalmology exams for early detection of glaucoma and other disorders of the eye.Up to date  [facility]  Would you like a referral to an eye doctor? No    Dental Screening: Recommended annual dental exams for proper oral hygiene  Community Resource Referral / Chronic Care Management: CRR required this visit?  No   CCM required this visit?  No   Plan:    I have personally reviewed and noted the following in the patient's chart:   Medical and social history Use of alcohol, tobacco or illicit drugs  Current medications and supplements including opioid prescriptions. Patient is not currently taking opioid prescriptions. Functional ability and status Nutritional status Physical activity Advanced directives List of other physicians Hospitalizations, surgeries, and ER visits in previous 12 months Vitals Screenings to include cognitive, depression, and falls Referrals and appointments  In addition, I have reviewed and discussed with patient certain preventive protocols, quality metrics, and best practice recommendations. A written personalized care plan for preventive services as well as general preventive health recommendations were  provided to patient.   Angeline Fredericks, LPN   [id number]/[id number]   After Visit Summary: (Pick Up) Due to this being a telephonic visit, with patients personalized plan was offered to patient and patient has requested to Pick up at office.  Notes: Nothing significant to report at this time.

## 2024-01-08 NOTE — Telephone Encounter (Signed)
 Pt was supposed to stop buspar  on 7/24. LM for patient,.

## 2024-01-08 NOTE — Patient Instructions (Signed)
 Ms. Victoria Hanson , Thank you for taking time out of your busy schedule to complete your Annual Wellness Visit with me. I enjoyed our conversation and look forward to speaking with you again next year. I, as well as your care team,  appreciate your ongoing commitment to your health goals. Please review the following plan we discussed and let me know if I can assist you in the future. Your Game plan/ To Do List    Referrals: If you haven't heard from the office you've been referred to, please reach out to them at the phone provided.  Consider updating your vaccines Follow up Visits: Next Medicare AWV with our clinical staff: 01/11/25 @ 11:30   Have you seen your provider in the last 6 months (3 months if uncontrolled diabetes)? Yes Next Office Visit with your provider: 01/14/24  Clinician Recommendations:  Aim for 30 minutes of exercise or brisk walking, 6-8 glasses of water, and 5 servings of fruits and vegetables each day.       This is a list of the screening recommended for you and due dates:  Health Maintenance  Topic Date Due   COVID-19 Vaccine (3 - Moderna risk series) 01/30/2020   Zoster (Shingles) Vaccine (1 of 2) 02/07/2024*   Flu Shot  01/10/2024   Medicare Annual Wellness Visit  01/07/2025   Colon Cancer Screening  03/21/2026   DTaP/Tdap/Td vaccine (2 - Td or Tdap) 01/07/2030   Pneumococcal Vaccine for age over 66  Completed   DEXA scan (bone density measurement)  Completed   Hepatitis C Screening  Completed   Hepatitis B Vaccine  Aged Out   HPV Vaccine  Aged Out   Meningitis B Vaccine  Aged Out  *Topic was postponed. The date shown is not the original due date.    Advanced directives: (In Chart) A copy of your advanced directives are scanned into your chart should your provider ever need it. Advance Care Planning is important because it:  [x]  Makes sure you receive the medical care that is consistent with your values, goals, and preferences  [x]  It provides guidance to your  family and loved ones and reduces their decisional burden about whether or not they are making the right decisions based on your wishes.

## 2024-01-09 NOTE — Telephone Encounter (Signed)
 Left message for patient

## 2024-01-09 NOTE — Telephone Encounter (Signed)
 Called pt and she stated that she has been stopped, and will discuss at her office visit next week.

## 2024-01-09 NOTE — Telephone Encounter (Signed)
 Left message to return call to our office.

## 2024-01-14 ENCOUNTER — Ambulatory Visit (INDEPENDENT_AMBULATORY_CARE_PROVIDER_SITE_OTHER): Admitting: Internal Medicine

## 2024-01-14 ENCOUNTER — Encounter: Payer: Self-pay | Admitting: Internal Medicine

## 2024-01-14 VITALS — BP 144/78 | HR 67 | Temp 98.2°F | Resp 20 | Ht 63.0 in | Wt 182.4 lb

## 2024-01-14 DIAGNOSIS — R350 Frequency of micturition: Secondary | ICD-10-CM | POA: Diagnosis not present

## 2024-01-14 DIAGNOSIS — N183 Chronic kidney disease, stage 3 unspecified: Secondary | ICD-10-CM

## 2024-01-14 DIAGNOSIS — M7989 Other specified soft tissue disorders: Secondary | ICD-10-CM

## 2024-01-14 DIAGNOSIS — Z85038 Personal history of other malignant neoplasm of large intestine: Secondary | ICD-10-CM

## 2024-01-14 DIAGNOSIS — F320 Major depressive disorder, single episode, mild: Secondary | ICD-10-CM

## 2024-01-14 DIAGNOSIS — G20A1 Parkinson's disease without dyskinesia, without mention of fluctuations: Secondary | ICD-10-CM | POA: Diagnosis not present

## 2024-01-14 DIAGNOSIS — I773 Arterial fibromuscular dysplasia: Secondary | ICD-10-CM | POA: Diagnosis not present

## 2024-01-14 DIAGNOSIS — K760 Fatty (change of) liver, not elsewhere classified: Secondary | ICD-10-CM | POA: Diagnosis not present

## 2024-01-14 DIAGNOSIS — I779 Disorder of arteries and arterioles, unspecified: Secondary | ICD-10-CM

## 2024-01-14 DIAGNOSIS — I700 Atherosclerosis of aorta: Secondary | ICD-10-CM

## 2024-01-14 DIAGNOSIS — Z8589 Personal history of malignant neoplasm of other organs and systems: Secondary | ICD-10-CM | POA: Diagnosis not present

## 2024-01-14 DIAGNOSIS — E7800 Pure hypercholesterolemia, unspecified: Secondary | ICD-10-CM | POA: Diagnosis not present

## 2024-01-14 DIAGNOSIS — E782 Mixed hyperlipidemia: Secondary | ICD-10-CM

## 2024-01-14 DIAGNOSIS — R739 Hyperglycemia, unspecified: Secondary | ICD-10-CM

## 2024-01-14 DIAGNOSIS — G903 Multi-system degeneration of the autonomic nervous system: Secondary | ICD-10-CM

## 2024-01-14 DIAGNOSIS — I10 Essential (primary) hypertension: Secondary | ICD-10-CM

## 2024-01-14 LAB — POC URINALSYSI DIPSTICK (AUTOMATED)
Bilirubin, UA: NEGATIVE
Glucose, UA: NEGATIVE
Ketones, UA: NEGATIVE
Nitrite, UA: POSITIVE
Protein, UA: NEGATIVE
Spec Grav, UA: 1.01 (ref 1.010–1.025)
Urobilinogen, UA: 0.2 U/dL
pH, UA: 5.5 (ref 5.0–8.0)

## 2024-01-14 MED ORDER — AMOXICILLIN-POT CLAVULANATE 500-125 MG PO TABS: 1.0000 | ORAL_TABLET | Freq: Two times a day (BID) | ORAL | 0 refills | Status: DC

## 2024-01-14 NOTE — Progress Notes (Signed)
 Subjective:    Patient ID: Victoria Hanson, female    DOB: [birth year], 73 y.o.   MRN: [id number]  Patient here for No chief complaint on file.   HPI Here for a scheduled follow up - follow up regarding her blood pressure and parkinsons disease. Saw Dr Lorelle 11/20/23 - f/u left knee pain. Recommended to continue hinged knee brace. Does feel the brace helps. Last visit, had discussed with her regarding stopping xanax . Was given a trial of buspar . Continues on citalopram . Did not feel buspar  helped. Increased anxiety. Started back on xanax  - only taking 1/2 tablet. She request to continue 1/2 tablet xanax  for now. Increased stress with family issues - daughter's health issues. Discussed. Continues on citalopram . Breathing stable. Blood pressure still varying.    Past Medical History:  Diagnosis Date   Anxiety    Asthma    Atypical chest pain    a. 2003 Cath: reportedly nl per pt Cloretta);  b. 04/2015 [facility] Admission, neg troponin.   Colon cancer (HCC) 1999   a. 1999: Pt had partial colectomy. Did develop lung metastasis. Had right upper lobectomy in 01/2007 then had associated chemotherapy.   COPD (chronic obstructive pulmonary disease) (HCC)    DDD (degenerative disc disease), lumbosacral    Depression    Echocardiogram abnormal    a. 02/2010 Echo: EF 55-60%, mild LAE, no regional wall motion abnormalities   Elevated transaminase level    a. ? NASH;  b. 06/2015 - nl LFTs.   GERD (gastroesophageal reflux disease)    Hematuria    Holter monitor, abnormal    a. 12/2009 Holter monitoring: NSR, rare PACs and PVCs.   Hypercholesterolemia    Hyperlipidemia    Hypertension    Leaky heart valve    a. Per pt report - no evidence of valvular abnormalities on prior echoes.   Lesion of skin of Right breast 10/20/2017   Lung metastases 2008   a. S/P resection (lobectomy - right)   Morbid obesity (HCC)    Nephrolithiasis    pt denies   Parkinson disease (HCC) 08/2016   [name]   Past  Surgical History:  Procedure Laterality Date   BREAST EXCISIONAL BIOPSY Right 10/07/2009   benign   CARDIAC CATHETERIZATION  2003   negative as per pt report   CHOLECYSTECTOMY  2000   COLON SURGERY     Colon cancer, removed part of large colon   COLONOSCOPY N/A 04/25/2015   Procedure: COLONOSCOPY;  Surgeon: Lamar ONEIDA Holmes, MD;  Location: [facility] ENDOSCOPY;  Service: Endoscopy;  Laterality: N/A;   COLONOSCOPY WITH PROPOFOL  N/A 06/14/2017   Procedure: COLONOSCOPY WITH PROPOFOL ;  Surgeon: Holmes Lamar ONEIDA, MD;  Location: [facility] ENDOSCOPY;  Service: Endoscopy;  Laterality: N/A;   COLONOSCOPY WITH PROPOFOL  N/A 11/17/2019   Procedure: COLONOSCOPY WITH PROPOFOL ;  Surgeon: Therisa Bi, MD;  [facility];  Service: Gastroenterology;  Laterality: N/A;   COLONOSCOPY WITH PROPOFOL  N/A 03/21/2021   Procedure: COLONOSCOPY WITH PROPOFOL ;  Surgeon: Maryruth Ole ONEIDA, MD;  Location: [facility] ENDOSCOPY;  Service: Endoscopy;  Laterality: N/A;   ESOPHAGOGASTRODUODENOSCOPY (EGD) WITH PROPOFOL  N/A 03/21/2021   Procedure: ESOPHAGOGASTRODUODENOSCOPY (EGD) WITH PROPOFOL ;  Surgeon: Maryruth Ole ONEIDA, MD;  Location: [facility] ENDOSCOPY;  Service: Endoscopy;  Laterality: N/A;   LOBECTOMY  01/2007   Right lung, upper lobe right side removed   REPLACEMENT TOTAL KNEE Left 08/2020   TOTAL ABDOMINAL HYSTERECTOMY     abnormal bleeding   TUBAL LIGATION  1980   Family History  Problem Relation Age of Onset   Coronary artery disease Mother        died @ 4 of MI.   Mental illness Mother    Diabetes Mother    Heart attack Father 27       MI   Cancer Father        lung - died in his mid-70's   Hypertension Father    Diabetes Father    Sudden death Brother    Mental illness Brother    Heart attack Brother        MI @ 53, died @ age 22.   Hypertension Child    Breast cancer Neg Hx    Social History   Socioeconomic History   Marital status: Widowed    Spouse name: Not on file   Number of children: Not  on file   Years of education: Not on file   Highest education level: Not on file  Occupational History   Not on file  Tobacco Use   Smoking status: Never    Passive exposure: Yes   Smokeless tobacco: Never   Tobacco comments:    husband and son smoked in her home.  Vaping Use   Vaping status: Never Used  Substance and Sexual Activity   Alcohol use: Not Currently    Alcohol/week: 0.0 standard drinks of alcohol   Drug use: No   Sexual activity: Never  Other Topics Concern   Not on file  Social History Narrative   Widowed.   Disabled (colon/lung CA), retired.   Lives in [place] with her dtr, grand-dtr, and son.   Active.   Gets regular exercise, walks.   Right handed    Social Drivers of Health   Financial Resource Strain: Low Risk  (01/08/2024)   Overall Financial Resource Strain (CARDIA)    Difficulty of Paying Living Expenses: Not hard at all  Food Insecurity: No Food Insecurity (01/08/2024)   Hunger Vital Sign    Worried About Running Out of Food in the Last Year: Never true    Ran Out of Food in the Last Year: Never true  Transportation Needs: No Transportation Needs (01/08/2024)   PRAPARE - Administrator, Civil Service (Medical): No    Lack of Transportation (Non-Medical): No  Physical Activity: Inactive (01/08/2024)   Exercise Vital Sign    Days of Exercise per Week: 0 days    Minutes of Exercise per Session: 0 min  Stress: Stress Concern Present (01/08/2024)   [employer] of Occupational Health - Occupational Stress Questionnaire    Feeling of Stress: To some extent  Social Connections: Moderately Integrated (01/08/2024)   Social Connection and Isolation Panel    Frequency of Communication with Friends and Family: More than three times a week    Frequency of Social Gatherings with Friends and Family: Three times a week    Attends Religious Services: More than 4 times per year    Active Member of Clubs or Organizations: Yes    Attends Occupational hygienist Meetings: More than 4 times per year    Marital Status: Widowed     Review of Systems  Constitutional:  Negative for appetite change and unexpected weight change.  HENT:  Negative for congestion and sinus pressure.   Respiratory:  Negative for cough and chest tightness.        Breathing stable.   Cardiovascular:  Negative for chest pain and palpitations.       No increased  swelling. Stable.   Gastrointestinal:  Negative for abdominal pain, diarrhea, nausea and vomiting.  Genitourinary:  Positive for frequency. Negative for hematuria.       Increased frequency recently. Concerned regarding possible UTI.   Musculoskeletal:  Negative for myalgias.       Josefa appears to be doing better.   Skin:  Negative for color change and rash.  Neurological:  Negative for dizziness and light-headedness.  Psychiatric/Behavioral:  Negative for agitation.        Increased stress as outlined.        Objective:     BP (!) 144/78   Pulse 67   Temp 98.2 F (36.8 C)   Resp 20   Ht 5' 3 (1.6 m)   Wt 182 lb 6 oz (82.7 kg)   SpO2 98%   BMI 32.31 kg/m  Wt Readings from Last 3 Encounters:  01/14/24 182 lb 6 oz (82.7 kg)  01/08/24 180 lb (81.6 kg)  12/06/23 180 lb 12.8 oz (82 kg)    Physical Exam Vitals reviewed.  Constitutional:      General: She is not in acute distress.    Appearance: Normal appearance.  HENT:     Head: Normocephalic and atraumatic.     Right Ear: External ear normal.     Left Ear: External ear normal.     Mouth/Throat:     Pharynx: No oropharyngeal exudate or posterior oropharyngeal erythema.  Eyes:     General: No scleral icterus.       Right eye: No discharge.        Left eye: No discharge.     Conjunctiva/sclera: Conjunctivae normal.  Neck:     Thyroid : No thyromegaly.  Cardiovascular:     Rate and Rhythm: Normal rate and regular rhythm.  Pulmonary:     Effort: No respiratory distress.     Breath sounds: Normal breath sounds. No wheezing.   Abdominal:     General: Bowel sounds are normal.     Palpations: Abdomen is soft.     Tenderness: There is no abdominal tenderness.  Genitourinary:    Comments: No increased swelling.  Musculoskeletal:        General: No tenderness.     Cervical back: Neck supple. No tenderness.  Lymphadenopathy:     Cervical: No cervical adenopathy.  Skin:    Findings: No erythema or rash.  Neurological:     Mental Status: She is alert.  Psychiatric:        Mood and Affect: Mood normal.        Behavior: Behavior normal.         Outpatient Encounter Medications as of 01/14/2024  Medication Sig   albuterol  (VENTOLIN  HFA) 108 (90 Base) MCG/ACT inhaler INHALE 2 INHALATIONS BY MOUTH INTO THE LUNGS EVERY 6 HOURS AS NEEDED FOR WHEEZING   ALPRAZolam  (XANAX ) 0.5 MG tablet TAKE 1/2 TABLET BY MOUTH ONCE DAILY AS NEEDED   AMBULATORY NON FORMULARY MEDICATION Abdominal compression binder   amoxicillin -clavulanate (AUGMENTIN ) 500-125 MG tablet Take 1 tablet by mouth in the morning and at bedtime.   aspirin  EC 81 MG tablet Take 81 mg by mouth as needed.    Carbidopa -Levodopa  ER (SINEMET  CR) 25-100 MG tablet controlled release Take 2 at 7 AM, 2 at 11 AM, 1 at 4 PM and 2 tablets at bedtime .   cholecalciferol (VITAMIN D3) 25 MCG (1000 UNIT) tablet Take 1,000 Units by mouth daily.   citalopram  (CELEXA ) 20 MG tablet Take 1 tablet (  20 mg total) by mouth daily.   COD LIVER OIL PO Take by mouth as needed.   fluticasone -salmeterol (ADVAIR  DISKUS) 250-50 MCG/ACT AEPB Inhale 1 puff into the lungs in the morning and at bedtime.   montelukast  (SINGULAIR ) 10 MG tablet TAKE 1 TABLET BY MOUTH AT BEDTIME   Spacer/Aero-Holding Chambers DEVI 1 Units by Does not apply route daily.   traZODone  (DESYREL ) 50 MG tablet TAKE 2 TABLETS BY MOUTH AT BEDTIME   vitamin B-12 (CYANOCOBALAMIN ) 100 MCG tablet Take 100 mcg by mouth daily.   vitamin E 180 MG (400 UNITS) capsule Take 400 Units by mouth daily. (Patient not taking: Reported on  01/14/2024)   [DISCONTINUED] busPIRone  (BUSPAR ) 5 MG tablet Take 1 tablet (5 mg total) by mouth daily as needed. (Patient not taking: Reported on 01/08/2024)   No facility-administered encounter medications on file as of 01/14/2024.     Lab Results  Component Value Date   WBC 8.5 07/15/2023   HGB 12.3 07/15/2023   HCT 37.1 07/15/2023   PLT 248.0 07/15/2023   GLUCOSE 114 (H) 12/06/2023   CHOL 188 12/06/2023   TRIG 148 12/06/2023   HDL 56 12/06/2023   LDLDIRECT 163.4 12/31/2012   LDLCALC 106 (H) 12/06/2023   ALT 4 12/06/2023   AST 8 12/06/2023   NA 140 12/06/2023   K 4.5 12/06/2023   CL 103 12/06/2023   CREATININE 0.97 12/06/2023   BUN 17 12/06/2023   CO2 21 12/06/2023   TSH 1.80 04/16/2023   INR 1.0 12/12/2012   HGBA1C 5.7 (H) 12/06/2023    DG Tibia/Fibula Left Result Date: 08/17/2023 CLINICAL DATA:  Swelling, fall. EXAM: LEFT TIBIA AND FIBULA - 2 VIEW COMPARISON:  None Available. FINDINGS: There is no evidence of fracture or other focal bone lesions. No erosive change. Knee arthroplasty is intact. Ankle alignment is maintained. Generalized soft tissue edema. IMPRESSION: 1. Generalized soft tissue edema. 2. No acute osseous findings.  Intact knee arthroplasty. Electronically Signed   By: Andrea Gasman M.D.   On: 08/17/2023 16:41   US  Venous Img Lower Unilateral Left Result Date: 08/17/2023 CLINICAL DATA:  Swelling, fall. EXAM: LEFT LOWER EXTREMITY VENOUS DOPPLER ULTRASOUND TECHNIQUE: Gray-scale sonography with compression, as well as color and duplex ultrasound, were performed to evaluate the deep venous system(s) from the level of the common femoral vein through the popliteal and proximal calf veins. COMPARISON:  Left lower extremity ultrasound 01/30/2022 FINDINGS: VENOUS Normal compressibility of the common femoral, superficial femoral, and popliteal veins, as well as the visualized calf veins. Visualized portions of profunda femoral vein and great saphenous vein unremarkable. No  filling defects to suggest DVT on grayscale or color Doppler imaging. Doppler waveforms show normal direction of venous flow, normal respiratory plasticity and response to augmentation. Limited views of the contralateral common femoral vein are unremarkable. OTHER None. Limitations: none IMPRESSION: No evidence of left lower extremity DVT. Electronically Signed   By: Andrea Gasman M.D.   On: 08/17/2023 16:39       Assessment & Plan:  Urinary frequency Assessment & Plan: Urine dip - large leukocytes. Positive nitrite. Given symptoms and urine dip, will treat - UTI. Start augmentin . Await urine culture results. Follow.   Orders: -     POCT Urinalysis Dipstick (Automated) -     Urine Culture -     Urine Microscopic  Aortic atherosclerosis (HCC) Assessment & Plan: Continue aspirin . Off cholesterol medication.    Bilateral carotid artery disease, unspecified type [facility]) Assessment & Plan: Off  cholesterol medication. Blood pressure doing well. Continue aspirin .    Stage 3 chronic kidney disease, unspecified whether stage 3a or 3b CKD (HCC) Assessment & Plan: Continue to avoid antiinflammatory medication. Off micardis  due to low pressures. Blood pressure as outlined. No additional medication at this time. Follow pressures. Follow metabolic panel.    Depression, major, single episode, mild (HCC) Assessment & Plan: Continues on citalopram . Tried stopping xanax  and trial of buspar . Increased anxiety. She is back taking xanax  now - 1/2 tablet. Increased stress. Request to remain on xanax  for now - low dose. Follow.    Fibromuscular dysplasia (HCC) Assessment & Plan: Has been evaluated by [facility].  Continue risk factor modification. Continue daily aspirin .   Hepatic steatosis Assessment & Plan: Diet and exercise.  Seen on chest CT angio - 09/11/21. Follow liver panel. Continue diet and exercise as tolerated.    History of colon cancer Assessment & Plan:  Saw GI 12/20/22 - f/u colon  cancer and anemia.  Has desired no f/u colonoscopy.    History of malignant neoplasm metastatic to lung Assessment & Plan: S/p lobectomy.  Followed by pulmonary.  Albuterol  inhaler if needed. Breathing stable. Controlled if anxiety controlled. Request to stay on xanax  for now. Follow.    Hypercholesterolemia Assessment & Plan: Off crestor . Has desired not to take. Low cholesterol diet and exercise. Follow lipid panel.   Swelling of lower extremity Assessment & Plan: Stable. Has seen [facility]. Compression hose. Follow.    Parkinson's disease, unspecified whether dyskinesia present, unspecified whether manifestations fluctuate (HCC) Assessment & Plan: Followed by Dr Evonnie. Continues on sinemet . Overall appears to be doing better. Continue f/u with neurology.    Orthostatic hypotension due to Parkinson's disease [facility]) Assessment & Plan: Off blood pressure medication. Blood pressures vary. Slightly elevated today. Follow. Hold on making any changes at this time.    Primary hypertension Assessment & Plan: Blood pressure varying. Slightly elevated today. Hold on medication. Follow pressures.     Mixed hyperlipidemia Assessment & Plan: Off crestor . Low cholesterol diet and exercise. Follow lipid panel.    Hyperglycemia Assessment & Plan: Low carb diet and exercise. Follow met b and A1c.    Other orders -     Amoxicillin -Pot Clavulanate; Take 1 tablet by mouth in the morning and at bedtime.  Dispense: 10 tablet; Refill: 0     Allena Hamilton, MD

## 2024-01-15 ENCOUNTER — Ambulatory Visit: Payer: Self-pay | Admitting: Internal Medicine

## 2024-01-15 LAB — URINALYSIS, MICROSCOPIC ONLY

## 2024-01-17 LAB — URINE CULTURE
MICRO NUMBER:: 16789065
SPECIMEN QUALITY:: ADEQUATE

## 2024-01-19 ENCOUNTER — Encounter: Payer: Self-pay | Admitting: Internal Medicine

## 2024-01-19 NOTE — Assessment & Plan Note (Signed)
 Blood pressure varying. Slightly elevated today. Hold on medication. Follow pressures.

## 2024-01-19 NOTE — Assessment & Plan Note (Signed)
 S/p lobectomy.  Followed by pulmonary.  Albuterol  inhaler if needed. Breathing stable. Controlled if anxiety controlled. Request to stay on xanax  for now. Follow.

## 2024-01-19 NOTE — Assessment & Plan Note (Signed)
 Diet and exercise.  Seen on chest CT angio - 09/11/21. Follow liver panel. Continue diet and exercise as tolerated.

## 2024-01-19 NOTE — Assessment & Plan Note (Signed)
 Has been evaluated by [facility].  Continue risk factor modification. Continue daily aspirin .

## 2024-01-19 NOTE — Assessment & Plan Note (Signed)
 Off cholesterol medication. Blood pressure doing well. Continue aspirin .

## 2024-01-19 NOTE — Assessment & Plan Note (Signed)
 Continue aspirin . Off cholesterol medication.

## 2024-01-19 NOTE — Assessment & Plan Note (Signed)
 Off crestor . Has desired not to take. Low cholesterol diet and exercise. Follow lipid panel.

## 2024-01-19 NOTE — Assessment & Plan Note (Signed)
 Urine dip - large leukocytes. Positive nitrite. Given symptoms and urine dip, will treat - UTI. Start augmentin . Await urine culture results. Follow.

## 2024-01-19 NOTE — Assessment & Plan Note (Signed)
Off crestor.  Low cholesterol diet and exercise.  Follow lipid panel.  

## 2024-01-19 NOTE — Assessment & Plan Note (Signed)
 Low-carb diet and exercise.  Follow met b and A1c.

## 2024-01-19 NOTE — Assessment & Plan Note (Addendum)
 Followed by Dr Evonnie. Continues on sinemet . Overall appears to be doing better. Continue f/u with neurology.

## 2024-01-19 NOTE — Assessment & Plan Note (Signed)
 Continues on citalopram . Tried stopping xanax  and trial of buspar . Increased anxiety. She is back taking xanax  now - 1/2 tablet. Increased stress. Request to remain on xanax  for now - low dose. Follow.

## 2024-01-19 NOTE — Assessment & Plan Note (Signed)
 Off blood pressure medication. Blood pressures vary. Slightly elevated today. Follow. Hold on making any changes at this time.

## 2024-01-19 NOTE — Assessment & Plan Note (Signed)
 Stable. Has seen [facility]. Compression hose. Follow.

## 2024-01-19 NOTE — Assessment & Plan Note (Signed)
 Saw GI 12/20/22 - f/u colon cancer and anemia.  Has desired no f/u colonoscopy.

## 2024-01-19 NOTE — Assessment & Plan Note (Signed)
 Continue to avoid antiinflammatory medication. Off micardis  due to low pressures. Blood pressure as outlined. No additional medication at this time. Follow pressures. Follow metabolic panel.

## 2024-01-21 ENCOUNTER — Other Ambulatory Visit: Payer: Self-pay | Admitting: Internal Medicine

## 2024-01-21 ENCOUNTER — Telehealth: Payer: Self-pay

## 2024-01-21 NOTE — Telephone Encounter (Signed)
 Copied from CRM #[id number]. Topic: Clinical - Prescription Issue >> Jan 21, 2024 10:30 AM [name] wrote: Reason for CRM: Patient states she is unable to take the powder inhaler and needs a different option, please.

## 2024-01-21 NOTE — Telephone Encounter (Signed)
 Copied from CRM [id number]. Topic: Clinical - Medication Refill >> Jan 21, 2024 10:31 AM Victoria Hanson wrote: Medication: albuterol  (VENTOLIN  HFA) 108 (90 Base) MCG/ACT inhaler   Has the patient contacted their pharmacy? Yes (Agent: If no, request that the patient contact the pharmacy for the refill. If patient does not wish to contact the pharmacy document the reason why and proceed with request.) (Agent: If yes, when and what did the pharmacy advise?)  This is the patient's preferred pharmacy:  [facility] - [place], [place] - [address] [place] [address] Phone: [phone] Fax: [phone]  Is this the correct pharmacy for this prescription? Yes If no, delete pharmacy and type the correct one.   Has the prescription been filled recently? No  Is the patient out of the medication? No, but almost out. Cannot take the powder medication for the other inhaler.  Has the patient been seen for an appointment in the last year OR does the patient have an upcoming appointment? Yes  Can we respond through MyChart? No  Agent: Please be advised that Rx refills may take up to 3 business days. We ask that you follow-up with your pharmacy.

## 2024-01-21 NOTE — Telephone Encounter (Signed)
 Patient was calling in to let me know that she is not tolerating her advair  inhaler. (We switched to Advair  Diskus in May due to insurance purposes) Patient says this one she just can't do it because it does not work as good. Confirmed no acute sob or anything other symptoms. Pt says she is feeling ok but she wants her inhaler switched back. Ok send in new rx and submit for PA?

## 2024-01-21 NOTE — Telephone Encounter (Signed)
 Ok to change back to advair  HFA.

## 2024-01-22 ENCOUNTER — Telehealth: Payer: Self-pay

## 2024-01-22 ENCOUNTER — Other Ambulatory Visit (HOSPITAL_COMMUNITY): Payer: Self-pay

## 2024-01-22 ENCOUNTER — Other Ambulatory Visit: Payer: Self-pay

## 2024-01-22 MED ORDER — ALBUTEROL SULFATE HFA 108 (90 BASE) MCG/ACT IN AERS: INHALATION_SPRAY | RESPIRATORY_TRACT | 2 refills | Status: DC

## 2024-01-22 MED ORDER — FLUTICASONE-SALMETEROL 115-21 MCG/ACT IN AERO: 2.0000 | INHALATION_SPRAY | Freq: Two times a day (BID) | RESPIRATORY_TRACT | 3 refills | Status: DC

## 2024-01-22 NOTE — Telephone Encounter (Signed)
 Pharmacy Patient Advocate Encounter  Received notification from [employer] that Prior Authorization for Fluticasone -Salmeterol 115-21MCG/ACT aerosol  has been DENIED.  Full denial letter will be uploaded to the media tab. See denial reason below.   PA #/Case ID/Reference #: [id number]

## 2024-01-22 NOTE — Telephone Encounter (Signed)
 Please submit PA for Advair  HFA. Patient was changed end of May because insurance preferred advair  diskus. Patient has trialed this medication and is not tolerating. Has requested to switch back to Advair  HFA. New rx has been sent to her pharmacy. Looks like she has also tried and failed breo in the past as well. All in her med history.

## 2024-01-22 NOTE — Telephone Encounter (Signed)
 Pharmacy Patient Advocate Encounter   Received notification from Pt Calls Messages that prior authorization for Fluticasone -Salmeterol 115-21MCG/ACT aerosol  is required/requested.   Insurance verification completed.   The patient is insured through [facility] .   Per test claim: PA required; PA submitted to above mentioned insurance via Latent Key/confirmation #/EOC [id number] Status is pending

## 2024-01-22 NOTE — Telephone Encounter (Signed)
 Rx already ok'd for albuterol .

## 2024-01-22 NOTE — Telephone Encounter (Signed)
 PA request has been Submitted. New Encounter has been or will be created for follow up. For additional info see Pharmacy Prior Auth telephone encounter from 01/22/24.

## 2024-01-22 NOTE — Progress Notes (Signed)
 Rx sent in Advair  HFA.

## 2024-01-23 ENCOUNTER — Other Ambulatory Visit: Payer: Self-pay | Admitting: Internal Medicine

## 2024-01-23 MED ORDER — BUDESONIDE-FORMOTEROL FUMARATE 160-4.5 MCG/ACT IN AERO: 2.0000 | INHALATION_SPRAY | Freq: Two times a day (BID) | RESPIRATORY_TRACT | 3 refills | Status: AC

## 2024-01-23 NOTE — Addendum Note (Signed)
 Addended by: LEARTA PORTO D on: 01/23/2024 09:09 AM   Modules accepted: Orders

## 2024-01-23 NOTE — Telephone Encounter (Signed)
 Inhaler denied. See if agreeable to try symbicort . This is apparently is covered. If agreeable, then ok to send in symbicort  (160).

## 2024-01-23 NOTE — Telephone Encounter (Signed)
 Patient agreeable to try symbicort  160. Rx sent to pharmacy and patient is aware. Discontinued the advair  HFA since not covered.

## 2024-01-24 NOTE — Telephone Encounter (Signed)
 Reviewed PDMP. Rx ok'd for xanax .

## 2024-02-04 ENCOUNTER — Ambulatory Visit: Payer: Self-pay | Admitting: Psychiatry

## 2024-02-04 ENCOUNTER — Ambulatory Visit (INDEPENDENT_AMBULATORY_CARE_PROVIDER_SITE_OTHER): Admitting: Psychiatry

## 2024-02-04 ENCOUNTER — Encounter: Payer: Self-pay | Admitting: Psychiatry

## 2024-02-04 VITALS — BP 124/80 | HR 70 | Temp 98.4°F | Ht 63.0 in | Wt 180.6 lb

## 2024-02-04 DIAGNOSIS — Z9189 Other specified personal risk factors, not elsewhere classified: Secondary | ICD-10-CM

## 2024-02-04 DIAGNOSIS — Z79899 Other long term (current) drug therapy: Secondary | ICD-10-CM | POA: Diagnosis not present

## 2024-02-04 DIAGNOSIS — F411 Generalized anxiety disorder: Secondary | ICD-10-CM | POA: Diagnosis not present

## 2024-02-04 DIAGNOSIS — F32A Depression, unspecified: Secondary | ICD-10-CM | POA: Diagnosis not present

## 2024-02-04 MED ORDER — CITALOPRAM HYDROBROMIDE 10 MG PO TABS: 10.0000 mg | ORAL_TABLET | Freq: Every day | ORAL | 1 refills | Status: DC
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
## 2024-02-04 NOTE — Patient Instructions (Addendum)
 Ellouise Hummer- therapist [phone]  Please call for EKG - [phone]

## 2024-02-07 NOTE — Progress Notes (Signed)
 Assessment/Plan:    1.  Parkinson's disease, with dyskinesia, with fluctuations             -The biggest issue previously has been her intolerance to medication.  She seems to be doing fairly well right now and taking her medication faithfully. -Continue carbidopa /levodopa  25/100 CR, 2 at 7am, 2 at 11am, 1 at 3pm, 2 at 7pm   2.  Sialorrhea             -This is commonly associated with PD.  We talked about treatments.  The patient is not a candidate for oral anticholinergic therapy because of increased risk of confusion and falls.  We discussed Botox (type A and B) and 1% atropine drops.  We discusssed that candy like lemon drops can help by stimulating mm of the oropharynx to induce swallowing.   3.  Constipation             -discussed nature and pathophysiology and association with PD             -discussed importance of hydration.  Pt is to increase water intake             -pt is given a copy of the rancho recipe             -recommended daily colace             -recommended miralax prn   4.  Hx of Neurogenic Orthostatic Hypotension             -Amlodipine  has been discontinued.  -discussed hydration (she is not drinking enough) and is drinking way too much mountain dew  -cannot start midodrine due to higher blood pressure here today and last visit, and she admits that is often up at home.  -Discussed that she needs to use the abdominal compression binder that she was given when she is awake, if blood pressure is low.   5.  Depression  -Appreciate that primary care has been working to try to get the patient off of Xanax .  She uses this for shortness of breath, which is apparently all related to her anxiety.  Patient uses every other day, low-dose.  - Glad to see that patient now following with psychiatry.  She had her first visit February 04, 2024.  Her Celexa  was increased to 30 mg daily.  - Psychiatry has recommended a counselor and I agree with that.  I encouraged her to go to  that counselor    Subjective:   Victoria Hanson was seen today in follow up for Parkinsons disease.    My previous records were reviewed prior to todays visit as well as outside records available to me.  I did slightly increase her medication last visit.  I also referred her for physical and occupational therapies.  However, when they called her, the patient declined scheduling.  She tripped about 3 months ago coming in the door and tripped over the step and she got a wound over the left ankle.  She ended up at the wound center x 2.5 months.  No other falls.  We did talk about her mood and depression last visit, as I thought she needed a counselor as she was talking baby talk.  Dr. Glendia talked with her about a psychiatry referral and ultimately the patient was agreeable and has seen Dr. Coby.  She just saw her on 8/26.  Her citalopram  was increased.  Current movement disorder medications: carbidopa /levodopa  25/100 CR,  2 at 7am, 2 at 11am, 1 at 3pm, 2 at 7pm (slight increase)  PREVIOUS MEDICATIONS:  ropinirole (took 1 day and said made sleepy, but levodopa  was also started the same day; retried several years later at 0.25 mg and they called a week later and stated caused low BP but already had dx of noh); pramipexole (unclear if took it; 1 note stated that she did not take it and another note stated that she had hallucinations with it - pt does recall hallucinations with one medication); midodrine (given but didn't take it) ; rotigotine , 2 mg daily  ALLERGIES:   Allergies  Allergen Reactions   Macrobid [Nitrofurantoin Macrocrystal] Shortness Of Breath and Palpitations   Antihistamines, Diphenhydramine-Type Other (See Comments)    She does not know which antihistamine she has an allergic reaction to.   Aspirin  Other (See Comments)    Makes her sick on the stomach if she takes too many and causes bleeding.  Of note, she can take ibuprofen . Other reaction(s): Other (See Comments) Makes her  sick on the stomach.  Of note, she can take ibuprofen . Makes her sick on the stomach.  Of note, she can take ibuprofen .   Buspar  [Buspirone ] Other (See Comments)    Caused more panic attack    Ceftin [Cefuroxime Axetil] Other (See Comments)    Chest pains   Celecoxib Other (See Comments)    Reaction: Blood Disorder GI bleeding.   Cymbalta  [Duloxetine  Hcl] Other (See Comments)    Reaction: unknown   Diphenhydramine Other (See Comments)   Escitalopram  Other (See Comments)   Lexapro  [Escitalopram  Oxalate] Other (See Comments)    Reaction: Difficulty with speech   Metronidazole Other (See Comments)    Other reaction(s): Unknown   Nitrofurantoin Other (See Comments)   Zoloft [Sertraline Hcl]    Ciprofloxacin  Other (See Comments)    Reaction: unknown    CURRENT MEDICATIONS:  Current Meds  Medication Sig   albuterol  (VENTOLIN  HFA) 108 (90 Base) MCG/ACT inhaler INHALE 2 INHALATIONS BY MOUTH INTO THE LUNGS EVERY 6 HOURS AS NEEDED FOR WHEEZING   ALPRAZolam  (XANAX ) 0.5 MG tablet TAKE 1/2 TABLET BY MOUTH ONCE DAILY AS NEEDED   AMBULATORY NON FORMULARY MEDICATION Abdominal compression binder   aspirin  EC 81 MG tablet Take 81 mg by mouth as needed.    budesonide -formoterol  (SYMBICORT ) 160-4.5 MCG/ACT inhaler Inhale 2 puffs into the lungs 2 (two) times daily.   Carbidopa -Levodopa  ER (SINEMET  CR) 25-100 MG tablet controlled release Take 2 at 7 AM, 2 at 11 AM, 1 at 4 PM and 2 tablets at bedtime .   cholecalciferol (VITAMIN D3) 25 MCG (1000 UNIT) tablet Take 1,000 Units by mouth daily.   citalopram  (CELEXA ) 10 MG tablet Take 1 tablet (10 mg total) by mouth daily. Take along with 20 mg , total of 30 mg daily   citalopram  (CELEXA ) 20 MG tablet Take 1 tablet (20 mg total) by mouth daily.   COD LIVER OIL PO Take by mouth as needed.   montelukast  (SINGULAIR ) 10 MG tablet TAKE 1 TABLET BY MOUTH AT BEDTIME   Spacer/Aero-Holding Chambers DEVI 1 Units by Does not apply route daily.   traZODone  (DESYREL )  50 MG tablet TAKE 2 TABLETS BY MOUTH AT BEDTIME   vitamin B-12 (CYANOCOBALAMIN ) 100 MCG tablet Take 100 mcg by mouth daily.   vitamin E 180 MG (400 UNITS) capsule Take 400 Units by mouth daily.     Objective:   PHYSICAL EXAMINATION:    VITALS:   Vitals:  02/11/24 1122  BP: 132/78  Pulse: (!) 54  SpO2: 96%  Weight: 186 lb (84.4 kg)  Height: 5' 3 (1.6 m)      GEN:  The patient appears stated age and is in NAD. HEENT:  Normocephalic, atraumatic.  The mucous membranes are moist. The superficial temporal arteries are without ropiness or tenderness. Cardiovascular: brady.  Regular.   Lungs: Clear to auscultation bilaterally  Neurological examination:  Orientation: The patient is alert and oriented x3. Cranial nerves: There is good facial symmetry with mild facial hypomimia. The speech is fluent and clear. Soft palate rises symmetrically and there is no tongue deviation. Hearing is intact to conversational tone. Sensation: Sensation is intact to light touch throughout Motor: Strength is at least antigravity x4.  Movement examination: Tone: There is normal tone in the UE/LE Abnormal movements: mild dyskinesia of the trunk and the L leg Coordination:  There is mild decremation today, mostly with foot taps on the L Gait and Station: The patient pushes off to arise.  She is wide based and a bit unstable (she attributes to bad L knee where she previously had sx)  I have reviewed and interpreted the following labs independently    Chemistry      Component Value Date/Time   NA 140 12/06/2023 1543   NA 134 (L) 09/01/2014 1359   K 4.5 12/06/2023 1543   K 4.1 09/01/2014 1359   CL 103 12/06/2023 1543   CL 103 09/01/2014 1359   CO2 21 12/06/2023 1543   CO2 27 09/01/2014 1359   BUN 17 12/06/2023 1543   BUN 23 (H) 09/01/2014 1359   CREATININE 0.97 12/06/2023 1543   CREATININE 1.16 (H) 01/14/2017 1217      Component Value Date/Time   CALCIUM  9.1 12/06/2023 1543   CALCIUM  8.8  (L) 09/01/2014 1359   ALKPHOS 89 12/06/2023 1543   ALKPHOS 85 09/01/2014 1359   AST 8 12/06/2023 1543   AST 16 09/01/2014 1359   ALT 4 12/06/2023 1543   ALT 9 (L) 09/01/2014 1359   BILITOT <0.2 12/06/2023 1543   BILITOT 0.4 09/01/2014 1359       Lab Results  Component Value Date   WBC 8.5 07/15/2023   HGB 12.3 07/15/2023   HCT 37.1 07/15/2023   MCV 88.8 07/15/2023   PLT 248.0 07/15/2023    Lab Results  Component Value Date   TSH 1.80 04/16/2023     Total time spent on today's visit was 30 minutes, including both face-to-face time and nonface-to-face time.  Time included that spent on review of records (prior notes available to me/labs/imaging if pertinent), discussing treatment and goals, answering patient's questions and coordinating care.  Cc:  Glendia Shad, MD

## 2024-02-11 ENCOUNTER — Ambulatory Visit: Admitting: Internal Medicine

## 2024-02-11 ENCOUNTER — Ambulatory Visit (INDEPENDENT_AMBULATORY_CARE_PROVIDER_SITE_OTHER): Payer: 59 | Admitting: Neurology

## 2024-02-11 VITALS — BP 132/78 | HR 54 | Ht 63.0 in | Wt 186.0 lb

## 2024-02-11 DIAGNOSIS — G903 Multi-system degeneration of the autonomic nervous system: Secondary | ICD-10-CM | POA: Diagnosis not present

## 2024-02-11 DIAGNOSIS — G20B2 Parkinson's disease with dyskinesia, with fluctuations: Secondary | ICD-10-CM

## 2024-02-11 DIAGNOSIS — F330 Major depressive disorder, recurrent, mild: Secondary | ICD-10-CM

## 2024-02-11 NOTE — Patient Instructions (Signed)
 Register now!  We are planning a Parkinsons Disease educational symposium at [facility], [address] [place], [place], [place] [address] on September 19.  We will have a movement disorder physician expert from [employer] coming to speak and a caregiver speaker.  We will have a panel of experts that will show you who you may need on your team of people on your journey with Parkinsons.  I hope to see you there!  Use this QR code to register by scanning it with the camera app on your phone:      Need more help with registration?  Contact [email or web address]

## 2024-02-13 ENCOUNTER — Ambulatory Visit: Admitting: Internal Medicine

## 2024-02-13 VITALS — BP 128/74 | HR 75 | Resp 16 | Ht 63.0 in | Wt 181.6 lb

## 2024-02-13 DIAGNOSIS — I779 Disorder of arteries and arterioles, unspecified: Secondary | ICD-10-CM | POA: Diagnosis not present

## 2024-02-13 DIAGNOSIS — G20A1 Parkinson's disease without dyskinesia, without mention of fluctuations: Secondary | ICD-10-CM

## 2024-02-13 DIAGNOSIS — F320 Major depressive disorder, single episode, mild: Secondary | ICD-10-CM

## 2024-02-13 DIAGNOSIS — Z85038 Personal history of other malignant neoplasm of large intestine: Secondary | ICD-10-CM

## 2024-02-13 DIAGNOSIS — G903 Multi-system degeneration of the autonomic nervous system: Secondary | ICD-10-CM

## 2024-02-13 DIAGNOSIS — R739 Hyperglycemia, unspecified: Secondary | ICD-10-CM

## 2024-02-13 DIAGNOSIS — M7989 Other specified soft tissue disorders: Secondary | ICD-10-CM | POA: Diagnosis not present

## 2024-02-13 DIAGNOSIS — E538 Deficiency of other specified B group vitamins: Secondary | ICD-10-CM | POA: Diagnosis not present

## 2024-02-13 DIAGNOSIS — K760 Fatty (change of) liver, not elsewhere classified: Secondary | ICD-10-CM

## 2024-02-13 DIAGNOSIS — I773 Arterial fibromuscular dysplasia: Secondary | ICD-10-CM | POA: Diagnosis not present

## 2024-02-13 DIAGNOSIS — Z8589 Personal history of malignant neoplasm of other organs and systems: Secondary | ICD-10-CM

## 2024-02-13 DIAGNOSIS — E7800 Pure hypercholesterolemia, unspecified: Secondary | ICD-10-CM

## 2024-02-13 DIAGNOSIS — I700 Atherosclerosis of aorta: Secondary | ICD-10-CM

## 2024-02-13 DIAGNOSIS — N183 Chronic kidney disease, stage 3 unspecified: Secondary | ICD-10-CM | POA: Diagnosis not present

## 2024-02-13 DIAGNOSIS — L989 Disorder of the skin and subcutaneous tissue, unspecified: Secondary | ICD-10-CM

## 2024-02-13 MED ORDER — MUPIROCIN 2 % EX OINT: 1.0000 | TOPICAL_OINTMENT | Freq: Two times a day (BID) | CUTANEOUS | 0 refills | Status: AC

## 2024-02-13 NOTE — Progress Notes (Signed)
 Subjective:    Patient ID: Victoria Hanson, female    DOB: Dec 12, 1950, 73 y.o.   MRN: [id number]  Patient here for  Chief Complaint  Patient presents with   Medical Management of Chronic Issues    HPI Here for a scheduled follow up -  follow up regarding her blood pressure and parkinsons disease. Saw Dr Lorelle 11/20/23 - f/u left knee pain. Recommended to continue hinged knee brace. Does feel the brace helps. Saw psychiatry 02/04/24 - f/u anxiety  and depression. Recommended to increase celexa  to 30mg  q day. Continue xanax  prn. Recommended therapy - Ellouise Hummer. Had f/u with Dr Tat 02/11/24 - f/u parkinsons - recommended to continue sinemet . Discussed botox and atropine drops for sialorrhea. Also discussed miralax prn for constipation. Discussed use of compression binder. She does have persistent skin lesions/irritation - lower extremity. Has cleaned and soaked with epson salts. Peeled superficial skin - circular lesion - lower leg. No surrounding erythema. Breathing overall stable.    Past Surgical History:  Procedure Laterality Date   BREAST EXCISIONAL BIOPSY Right 10/07/2009   benign   CARDIAC CATHETERIZATION  2003   negative as per pt report   CHOLECYSTECTOMY  2000   COLON SURGERY     Colon cancer, removed part of large colon   COLONOSCOPY N/A 04/25/2015   Procedure: COLONOSCOPY;  Surgeon: Lamar ONEIDA Holmes, MD;  Location: [facility] ENDOSCOPY;  Service: Endoscopy;  Laterality: N/A;   COLONOSCOPY WITH PROPOFOL  N/A 06/14/2017   Procedure: COLONOSCOPY WITH PROPOFOL ;  Surgeon: Holmes Lamar ONEIDA, MD;  Location: [facility] ENDOSCOPY;  Service: Endoscopy;  Laterality: N/A;   COLONOSCOPY WITH PROPOFOL  N/A 11/17/2019   Procedure: COLONOSCOPY WITH PROPOFOL ;  Surgeon: Therisa Bi, MD;  Location: [facility] ENDOSCOPY;  Service: Gastroenterology;  Laterality: N/A;   COLONOSCOPY WITH PROPOFOL  N/A 03/21/2021   Procedure: COLONOSCOPY WITH PROPOFOL ;  Surgeon: Maryruth Ole ONEIDA, MD;  Location: [facility] ENDOSCOPY;   Service: Endoscopy;  Laterality: N/A;   ESOPHAGOGASTRODUODENOSCOPY (EGD) WITH PROPOFOL  N/A 03/21/2021   Procedure: ESOPHAGOGASTRODUODENOSCOPY (EGD) WITH PROPOFOL ;  Surgeon: Maryruth Ole ONEIDA, MD;  Location: [facility] ENDOSCOPY;  Service: Endoscopy;  Laterality: N/A;   LOBECTOMY  01/2007   Right lung, upper lobe right side removed   REPLACEMENT TOTAL KNEE Left 08/2020   TOTAL ABDOMINAL HYSTERECTOMY     abnormal bleeding   TUBAL LIGATION  1980   Family History  Problem Relation Age of Onset   Coronary artery disease Mother        died @ 65 of MI.   Mental illness Mother    Diabetes Mother    Heart attack Father 41       MI   Cancer Father        lung - died in his mid-70's   Hypertension Father    Diabetes Father    Sudden death Brother    Mental illness Brother    Heart attack Brother        MI @ 45, died @ age 17.   Hypertension Child    Drug abuse Son    Drug abuse Son    Breast cancer Neg Hx    Social History   Socioeconomic History   Marital status: Widowed    Spouse name: Not on file   Number of children: 3   Years of education: Not on file   Highest education level: Some college, no degree  Occupational History   Not on file  Tobacco Use   Smoking status: Never  Passive exposure: Yes   Smokeless tobacco: Never   Tobacco comments:    husband and son smoked in her home.  Vaping Use   Vaping status: Never Used  Substance and Sexual Activity   Alcohol use: Not Currently    Alcohol/week: 0.0 standard drinks of alcohol   Drug use: No   Sexual activity: Not Currently  Other Topics Concern   Not on file  Social History Narrative   Widowed.   Disabled (colon/lung CA), retired.   Lives in [place] with her dtr, grand-dtr, and son.   Active.   Gets regular exercise, walks.   Right handed    Social Drivers of Health   Financial Resource Strain: Low Risk  (01/08/2024)   Overall Financial Resource Strain (CARDIA)    Difficulty of Paying Living Expenses:  Not hard at all  Food Insecurity: No Food Insecurity (01/08/2024)   Hunger Vital Sign    Worried About Running Out of Food in the Last Year: Never true    Ran Out of Food in the Last Year: Never true  Transportation Needs: No Transportation Needs (01/08/2024)   PRAPARE - Administrator, Civil Service (Medical): No    Lack of Transportation (Non-Medical): No  Physical Activity: Inactive (01/08/2024)   Exercise Vital Sign    Days of Exercise per Week: 0 days    Minutes of Exercise per Session: 0 min  Stress: Stress Concern Present (01/08/2024)   [employer] of Occupational Health - Occupational Stress Questionnaire    Feeling of Stress: To some extent  Social Connections: Moderately Integrated (01/08/2024)   Social Connection and Isolation Panel    Frequency of Communication with Friends and Family: More than three times a week    Frequency of Social Gatherings with Friends and Family: Three times a week    Attends Religious Services: More than 4 times per year    Active Member of Clubs or Organizations: Yes    Attends Banker Meetings: More than 4 times per year    Marital Status: Widowed     Review of Systems  Constitutional:  Negative for appetite change and unexpected weight change.  HENT:  Negative for congestion and sinus pressure.   Respiratory:  Negative for cough and chest tightness.        Breathing stable.   Cardiovascular:  Negative for chest pain and palpitations.       Lower extremity swelling - stable.   Gastrointestinal:  Negative for abdominal pain, diarrhea, nausea and vomiting.  Genitourinary:  Negative for difficulty urinating and dysuria.  Musculoskeletal:  Negative for myalgias.  Skin:        Open skin lesion - lower extremity. No surrounding erythema.   Neurological:  Negative for dizziness and headaches.  Psychiatric/Behavioral:         Increased stress as outlined. Discussed.        Objective:     BP 128/74   Pulse 75    Resp 16   Ht 5' 3 (1.6 m)   Wt 181 lb 9.6 oz (82.4 kg)   SpO2 97%   BMI 32.17 kg/m  Wt Readings from Last 3 Encounters:  02/13/24 181 lb 9.6 oz (82.4 kg)  02/11/24 186 lb (84.4 kg)  01/14/24 182 lb 6 oz (82.7 kg)    Physical Exam Vitals reviewed.  Constitutional:      General: She is not in acute distress.    Appearance: Normal appearance.  HENT:  Head: Normocephalic and atraumatic.     Right Ear: External ear normal.     Left Ear: External ear normal.  Eyes:     General: No scleral icterus.       Right eye: No discharge.        Left eye: No discharge.     Conjunctiva/sclera: Conjunctivae normal.  Neck:     Thyroid : No thyromegaly.  Cardiovascular:     Rate and Rhythm: Normal rate and regular rhythm.  Pulmonary:     Effort: No respiratory distress.     Breath sounds: Normal breath sounds. No wheezing.  Abdominal:     General: Bowel sounds are normal.     Palpations: Abdomen is soft.     Tenderness: There is no abdominal tenderness.  Musculoskeletal:        General: No tenderness.     Cervical back: Neck supple. No tenderness.     Comments: Pedal and lower extremity swelling - stable.   Lymphadenopathy:     Cervical: No cervical adenopathy.  Skin:    Comments: Open superficial skin lesion - lower extremity - no surrounding erythema.   Neurological:     Mental Status: She is alert.  Psychiatric:        Mood and Affect: Mood normal.        Behavior: Behavior normal.         Outpatient Encounter Medications as of 02/13/2024  Medication Sig   mupirocin  ointment (BACTROBAN ) 2 % Apply 1 Application topically 2 (two) times daily.   albuterol  (VENTOLIN  HFA) 108 (90 Base) MCG/ACT inhaler INHALE 2 INHALATIONS BY MOUTH INTO THE LUNGS EVERY 6 HOURS AS NEEDED FOR WHEEZING   ALPRAZolam  (XANAX ) 0.5 MG tablet TAKE 1/2 TABLET BY MOUTH ONCE DAILY AS NEEDED   AMBULATORY NON FORMULARY MEDICATION Abdominal compression binder   aspirin  EC 81 MG tablet Take 81 mg by mouth as  needed.    budesonide -formoterol  (SYMBICORT ) 160-4.5 MCG/ACT inhaler Inhale 2 puffs into the lungs 2 (two) times daily.   Carbidopa -Levodopa  ER (SINEMET  CR) 25-100 MG tablet controlled release Take 2 at 7 AM, 2 at 11 AM, 1 at 4 PM and 2 tablets at bedtime .   cholecalciferol (VITAMIN D3) 25 MCG (1000 UNIT) tablet Take 1,000 Units by mouth daily.   citalopram  (CELEXA ) 10 MG tablet Take 1 tablet (10 mg total) by mouth daily. Take along with 20 mg , total of 30 mg daily   citalopram  (CELEXA ) 20 MG tablet Take 1 tablet (20 mg total) by mouth daily.   COD LIVER OIL PO Take by mouth as needed.   montelukast  (SINGULAIR ) 10 MG tablet TAKE 1 TABLET BY MOUTH AT BEDTIME   Spacer/Aero-Holding Chambers DEVI 1 Units by Does not apply route daily.   traZODone  (DESYREL ) 50 MG tablet TAKE 2 TABLETS BY MOUTH AT BEDTIME   vitamin B-12 (CYANOCOBALAMIN ) 100 MCG tablet Take 100 mcg by mouth daily.   vitamin E 180 MG (400 UNITS) capsule Take 400 Units by mouth daily.   No facility-administered encounter medications on file as of 02/13/2024.     Lab Results  Component Value Date   WBC 8.5 07/15/2023   HGB 12.3 07/15/2023   HCT 37.1 07/15/2023   PLT 248.0 07/15/2023   GLUCOSE 114 (H) 12/06/2023   CHOL 188 12/06/2023   TRIG 148 12/06/2023   HDL 56 12/06/2023   LDLDIRECT 163.4 12/31/2012   LDLCALC 106 (H) 12/06/2023   ALT 4 12/06/2023   AST 8 12/06/2023  NA 140 12/06/2023   K 4.5 12/06/2023   CL 103 12/06/2023   CREATININE 0.97 12/06/2023   BUN 17 12/06/2023   CO2 21 12/06/2023   TSH 1.80 04/16/2023   INR 1.0 12/12/2012   HGBA1C 5.7 (H) 12/06/2023    DG Tibia/Fibula Left Result Date: 08/17/2023 CLINICAL DATA:  Swelling, fall. EXAM: LEFT TIBIA AND FIBULA - 2 VIEW COMPARISON:  None Available. FINDINGS: There is no evidence of fracture or other focal bone lesions. No erosive change. Knee arthroplasty is intact. Ankle alignment is maintained. Generalized soft tissue edema. IMPRESSION: 1. Generalized soft  tissue edema. 2. No acute osseous findings.  Intact knee arthroplasty. Electronically Signed   By: Andrea Gasman M.D.   On: 08/17/2023 16:41   US  Venous Img Lower Unilateral Left Result Date: 08/17/2023 CLINICAL DATA:  Swelling, fall. EXAM: LEFT LOWER EXTREMITY VENOUS DOPPLER ULTRASOUND TECHNIQUE: Gray-scale sonography with compression, as well as color and duplex ultrasound, were performed to evaluate the deep venous system(s) from the level of the common femoral vein through the popliteal and proximal calf veins. COMPARISON:  Left lower extremity ultrasound 01/30/2022 FINDINGS: VENOUS Normal compressibility of the common femoral, superficial femoral, and popliteal veins, as well as the visualized calf veins. Visualized portions of profunda femoral vein and great saphenous vein unremarkable. No filling defects to suggest DVT on grayscale or color Doppler imaging. Doppler waveforms show normal direction of venous flow, normal respiratory plasticity and response to augmentation. Limited views of the contralateral common femoral vein are unremarkable. OTHER None. Limitations: none IMPRESSION: No evidence of left lower extremity DVT. Electronically Signed   By: Andrea Gasman M.D.   On: 08/17/2023 16:39       Assessment & Plan:  Parkinson's disease, unspecified whether dyskinesia present, unspecified whether manifestations fluctuate (HCC) Assessment & Plan: Followed by Dr Evonnie. Continues on sinemet . Overall appears to be doing better. Continue f/u with neurology.    Aortic atherosclerosis (HCC) Assessment & Plan: Continue aspirin . She is off cholesterol medication.    B12 deficiency Assessment & Plan: Last B12 level >1000, follow.    Bilateral carotid artery disease, unspecified type (HCC) Assessment & Plan: Off cholesterol medication. Blood pressure doing well. Continue aspirin .    Stage 3 chronic kidney disease, unspecified whether stage 3a or 3b CKD (HCC) Assessment & Plan: Continue  to avoid antiinflammatory medication. Off micardis  due to low pressures. Blood pressure as outlined. No additional medication at this time. Follow pressures. Follow metabolic panel.     Depression, major, single episode, mild (HCC) Assessment & Plan: Continues on citalopram . Tried stopping xanax  and trial of buspar . Increased anxiety. She is back taking xanax  now - 1/2 tablet. Increased stress. Request to remain on xanax  for now - low dose. Saw psychiatry. Increased citalopram  to 30mg . Continue f/u with psychiatry.    Swelling of lower extremity Assessment & Plan: Stable. Has seen [facility]. Compression hose. Follow.    Orthostatic hypotension due to Parkinson's disease [facility]) Assessment & Plan: Off blood pressure medication. Blood pressures vary. Discussed. Hold on making changes today. Follow.    Pure hypercholesterolemia Assessment & Plan: Off crestor . Did not tolerate. Low cholesterol diet and exercise. Follow lipid panel and liver function tests.    Hyperglycemia Assessment & Plan: Low carb diet and exercise. Follow met b and A1c.    History of malignant neoplasm metastatic to lung Assessment & Plan: S/p lobectomy.  Followed by pulmonary.  Albuterol  inhaler if needed. Breathing stable. Controlled if anxiety controlled. Request to stay on  xanax  for now. Seeing psychiatry now. Follow.    History of colon cancer Assessment & Plan:  Saw GI 12/20/22 - f/u colon cancer and anemia.  Has desired no f/u colonoscopy.    Hepatic steatosis Assessment & Plan: Diet and exercise.  Seen on chest CT angio - 09/11/21. Follow liver panel. Continue diet and exercise as tolerated.    Fibromuscular dysplasia (HCC) Assessment & Plan: Has been evaluated by [facility].  Continue risk factor modification. Continue daily aspirin .   Leg lesion Assessment & Plan: Open lesion. Tefla and wrap. Leave open. No surrounding erythema. Follow. Request referral to dermatology.   Orders: -     Ambulatory  referral to Dermatology  Other orders -     Mupirocin ; Apply 1 Application topically 2 (two) times daily.  Dispense: 22 g; Refill: 0     Allena Hamilton, MD

## 2024-02-23 ENCOUNTER — Encounter: Payer: Self-pay | Admitting: Internal Medicine

## 2024-02-23 DIAGNOSIS — L989 Disorder of the skin and subcutaneous tissue, unspecified: Secondary | ICD-10-CM | POA: Insufficient documentation

## 2024-02-23 NOTE — Assessment & Plan Note (Signed)
 Has been evaluated by [facility].  Continue risk factor modification. Continue daily aspirin .

## 2024-02-23 NOTE — Assessment & Plan Note (Signed)
 Followed by Dr Evonnie. Continues on sinemet . Overall appears to be doing better. Continue f/u with neurology.

## 2024-02-23 NOTE — Assessment & Plan Note (Signed)
 Low-carb diet and exercise.  Follow met b and A1c.

## 2024-02-23 NOTE — Assessment & Plan Note (Signed)
 Off cholesterol medication. Blood pressure doing well. Continue aspirin .

## 2024-02-23 NOTE — Assessment & Plan Note (Signed)
 S/p lobectomy.  Followed by pulmonary.  Albuterol  inhaler if needed. Breathing stable. Controlled if anxiety controlled. Request to stay on xanax  for now. Seeing psychiatry now. Follow.

## 2024-02-23 NOTE — Assessment & Plan Note (Signed)
 Continue to avoid antiinflammatory medication. Off micardis  due to low pressures. Blood pressure as outlined. No additional medication at this time. Follow pressures. Follow metabolic panel.

## 2024-02-23 NOTE — Assessment & Plan Note (Signed)
 Continue aspirin . She is off cholesterol medication.

## 2024-02-23 NOTE — Assessment & Plan Note (Signed)
 Last B12 level >1000, follow.

## 2024-02-23 NOTE — Assessment & Plan Note (Signed)
 Diet and exercise.  Seen on chest CT angio - 09/11/21. Follow liver panel. Continue diet and exercise as tolerated.

## 2024-02-23 NOTE — Assessment & Plan Note (Signed)
 Saw GI 12/20/22 - f/u colon cancer and anemia.  Has desired no f/u colonoscopy.

## 2024-02-23 NOTE — Assessment & Plan Note (Signed)
 Stable. Has seen [facility]. Compression hose. Follow.

## 2024-02-23 NOTE — Assessment & Plan Note (Signed)
 Off crestor. Did not tolerate. Low cholesterol diet and exercise. Follow lipid panel and liver function tests.

## 2024-02-23 NOTE — Assessment & Plan Note (Signed)
 Off blood pressure medication. Blood pressures vary. Discussed. Hold on making changes today. Follow.

## 2024-02-23 NOTE — Assessment & Plan Note (Signed)
 Continues on citalopram . Tried stopping xanax  and trial of buspar . Increased anxiety. She is back taking xanax  now - 1/2 tablet. Increased stress. Request to remain on xanax  for now - low dose. Saw psychiatry. Increased citalopram  to 30mg . Continue f/u with psychiatry.

## 2024-02-23 NOTE — Assessment & Plan Note (Signed)
 Open lesion. Tefla and wrap. Leave open. No surrounding erythema. Follow. Request referral to dermatology.

## 2024-02-24 ENCOUNTER — Encounter: Payer: Self-pay | Admitting: Internal Medicine

## 2024-02-24 ENCOUNTER — Ambulatory Visit: Payer: Self-pay | Admitting: Internal Medicine

## 2024-02-24 ENCOUNTER — Ambulatory Visit: Admitting: Internal Medicine

## 2024-02-24 ENCOUNTER — Ambulatory Visit: Payer: Self-pay

## 2024-02-24 ENCOUNTER — Ambulatory Visit
Admission: RE | Admit: 2024-02-24 | Discharge: 2024-02-24 | Disposition: A | Discharge status: Home / Self Care | Source: Ambulatory Visit | Attending: Internal Medicine | Admitting: Internal Medicine

## 2024-02-24 VITALS — BP 128/72 | HR 73 | Resp 16 | Ht 63.0 in | Wt 183.6 lb

## 2024-02-24 DIAGNOSIS — G20A1 Parkinson's disease without dyskinesia, without mention of fluctuations: Secondary | ICD-10-CM

## 2024-02-24 DIAGNOSIS — S81802D Unspecified open wound, left lower leg, subsequent encounter: Secondary | ICD-10-CM

## 2024-02-24 DIAGNOSIS — M7989 Other specified soft tissue disorders: Secondary | ICD-10-CM

## 2024-02-24 MED ORDER — DOXYCYCLINE HYCLATE 100 MG PO TABS
100.0000 mg | ORAL_TABLET | Freq: Two times a day (BID) | ORAL | 0 refills | Status: DC
Start: 2024-02-24 — End: 2024-04-26

## 2024-02-24 NOTE — Assessment & Plan Note (Signed)
 Followed by Dr Evonnie. Continues on sinemet . Overall appears to be doing better. Continue f/u with neurology.

## 2024-02-24 NOTE — Progress Notes (Signed)
 Subjective:    Patient ID: Victoria Hanson, female    DOB: September 24, 1950, 73 y.o.   MRN: [id number]  Patient here for  Chief Complaint  Patient presents with   leg red and swollen    HPI Here for work in with concerns regarding left lower extremity swelling > right and redness. Recently evaluated s/p a bite. Had open lower leg wound. This has scabbed over, but now she has two open skin lesions below the smaller scabbed lesion. No fever. Does have surrounding erythema. Some increased pain around the open wounds. Eating. Breathing stable.    Past Medical History:  Diagnosis Date   Anxiety    Asthma    Atypical chest pain    a. 2003 Cath: reportedly nl per pt Cloretta);  b. 04/2015 [facility] Admission, neg troponin.   Colon cancer (HCC) 1999   a. 1999: Pt had partial colectomy. Did develop lung metastasis. Had right upper lobectomy in 01/2007 then had associated chemotherapy.   COPD (chronic obstructive pulmonary disease) (HCC)    DDD (degenerative disc disease), lumbosacral    Depression    Echocardiogram abnormal    a. 02/2010 Echo: EF 55-60%, mild LAE, no regional wall motion abnormalities   Elevated transaminase level    a. ? NASH;  b. 06/2015 - nl LFTs.   GERD (gastroesophageal reflux disease)    Hematuria    Holter monitor, abnormal    a. 12/2009 Holter monitoring: NSR, rare PACs and PVCs.   Hypercholesterolemia    Hyperlipidemia    Hypertension    Leaky heart valve    a. Per pt report - no evidence of valvular abnormalities on prior echoes.   Lesion of skin of Right breast 10/20/2017   Lung metastases 2008   a. S/P resection (lobectomy - right)   Morbid obesity (HCC)    Nephrolithiasis    pt denies   Parkinson disease (HCC) 08/2016   [name]   Past Surgical History:  Procedure Laterality Date   BREAST EXCISIONAL BIOPSY Right 10/07/2009   benign   CARDIAC CATHETERIZATION  2003   negative as per pt report   CHOLECYSTECTOMY  2000   COLON SURGERY     Colon cancer,  removed part of large colon   COLONOSCOPY N/A 04/25/2015   Procedure: COLONOSCOPY;  Surgeon: Lamar ONEIDA Holmes, MD;  Location: [facility] ENDOSCOPY;  Service: Endoscopy;  Laterality: N/A;   COLONOSCOPY WITH PROPOFOL  N/A 06/14/2017   Procedure: COLONOSCOPY WITH PROPOFOL ;  Surgeon: Holmes Lamar ONEIDA, MD;  Location: [facility] ENDOSCOPY;  Service: Endoscopy;  Laterality: N/A;   COLONOSCOPY WITH PROPOFOL  N/A 11/17/2019   Procedure: COLONOSCOPY WITH PROPOFOL ;  Surgeon: Therisa Bi, MD;  Location: [facility] ENDOSCOPY;  Service: Gastroenterology;  Laterality: N/A;   COLONOSCOPY WITH PROPOFOL  N/A 03/21/2021   Procedure: COLONOSCOPY WITH PROPOFOL ;  Surgeon: Maryruth Ole ONEIDA, MD;  Location: [facility] ENDOSCOPY;  Service: Endoscopy;  Laterality: N/A;   ESOPHAGOGASTRODUODENOSCOPY (EGD) WITH PROPOFOL  N/A 03/21/2021   Procedure: ESOPHAGOGASTRODUODENOSCOPY (EGD) WITH PROPOFOL ;  Surgeon: Maryruth Ole ONEIDA, MD;  Location: [facility] ENDOSCOPY;  Service: Endoscopy;  Laterality: N/A;   LOBECTOMY  01/2007   Right lung, upper lobe right side removed   REPLACEMENT TOTAL KNEE Left 08/2020   TOTAL ABDOMINAL HYSTERECTOMY     abnormal bleeding   TUBAL LIGATION  1980   Family History  Problem Relation Age of Onset   Coronary artery disease Mother        died @ 69 of MI.   Mental illness Mother  Diabetes Mother    Heart attack Father 4       MI   Cancer Father        lung - died in his mid-70's   Hypertension Father    Diabetes Father    Sudden death Brother    Mental illness Brother    Heart attack Brother        MI @ 62, died @ age 4.   Hypertension Child    Drug abuse Son    Drug abuse Son    Breast cancer Neg Hx    Social History   Socioeconomic History   Marital status: Widowed    Spouse name: Not on file   Number of children: 3   Years of education: Not on file   Highest education level: Some college, no degree  Occupational History   Not on file  Tobacco Use   Smoking status: Never    Passive exposure: Yes    Smokeless tobacco: Never   Tobacco comments:    husband and son smoked in her home.  Vaping Use   Vaping status: Never Used  Substance and Sexual Activity   Alcohol use: Not Currently    Alcohol/week: 0.0 standard drinks of alcohol   Drug use: No   Sexual activity: Not Currently  Other Topics Concern   Not on file  Social History Narrative   Widowed.   Disabled (colon/lung CA), retired.   Lives in [place] with her dtr, grand-dtr, and son.   Active.   Gets regular exercise, walks.   Right handed    Social Drivers of Health   Financial Resource Strain: Low Risk  (01/08/2024)   Overall Financial Resource Strain (CARDIA)    Difficulty of Paying Living Expenses: Not hard at all  Food Insecurity: No Food Insecurity (01/08/2024)   Hunger Vital Sign    Worried About Running Out of Food in the Last Year: Never true    Ran Out of Food in the Last Year: Never true  Transportation Needs: No Transportation Needs (01/08/2024)   PRAPARE - Administrator, Civil Service (Medical): No    Lack of Transportation (Non-Medical): No  Physical Activity: Inactive (01/08/2024)   Exercise Vital Sign    Days of Exercise per Week: 0 days    Minutes of Exercise per Session: 0 min  Stress: Stress Concern Present (01/08/2024)   [employer] of Occupational Health - Occupational Stress Questionnaire    Feeling of Stress: To some extent  Social Connections: Moderately Integrated (01/08/2024)   Social Connection and Isolation Panel    Frequency of Communication with Friends and Family: More than three times a week    Frequency of Social Gatherings with Friends and Family: Three times a week    Attends Religious Services: More than 4 times per year    Active Member of Clubs or Organizations: Yes    Attends Banker Meetings: More than 4 times per year    Marital Status: Widowed     Review of Systems  Constitutional:  Negative for appetite change and fever.  HENT:   Negative for congestion and sinus pressure.   Respiratory:  Negative for cough and chest tightness.        Breathing stable.   Cardiovascular:  Positive for leg swelling. Negative for chest pain and palpitations.  Gastrointestinal:  Negative for abdominal pain, diarrhea and vomiting.  Genitourinary:  Negative for difficulty urinating and dysuria.  Musculoskeletal:  Negative for myalgias.  Skin:  Negative for color change and rash.  Neurological:  Negative for dizziness and headaches.  Psychiatric/Behavioral:  Negative for agitation and dysphoric mood.        Objective:     BP 128/72   Pulse 73   Resp 16   Ht 5' 3 (1.6 m)   Wt 183 lb 9.6 oz (83.3 kg)   SpO2 98%   BMI 32.52 kg/m  Wt Readings from Last 3 Encounters:  02/24/24 183 lb 9.6 oz (83.3 kg)  02/13/24 181 lb 9.6 oz (82.4 kg)  02/11/24 186 lb (84.4 kg)    Physical Exam Vitals reviewed.  Constitutional:      General: She is not in acute distress.    Appearance: Normal appearance.  HENT:     Head: Normocephalic and atraumatic.     Right Ear: External ear normal.     Left Ear: External ear normal.  Eyes:     General: No scleral icterus.       Right eye: No discharge.        Left eye: No discharge.     Conjunctiva/sclera: Conjunctivae normal.  Neck:     Thyroid : No thyromegaly.  Cardiovascular:     Rate and Rhythm: Normal rate and regular rhythm.  Pulmonary:     Effort: No respiratory distress.     Breath sounds: Normal breath sounds. No wheezing.  Abdominal:     General: Bowel sounds are normal.     Palpations: Abdomen is soft.     Tenderness: There is no abdominal tenderness.  Musculoskeletal:     Cervical back: Neck supple. No tenderness.     Comments: Increased lower extremity swelling - Left > right. Small scabbed lesion - left lower extremity with two slightly larger open lesions.  Surrounding erythema. Minimal tenderness to palpation around the open wounds and into the calf.   Lymphadenopathy:      Cervical: No cervical adenopathy.  Skin:    Findings: No erythema or rash.  Neurological:     Mental Status: She is alert.  Psychiatric:        Mood and Affect: Mood normal.        Behavior: Behavior normal.         Outpatient Encounter Medications as of 02/24/2024  Medication Sig   doxycycline  (VIBRA -TABS) 100 MG tablet Take 1 tablet (100 mg total) by mouth 2 (two) times daily.   albuterol  (VENTOLIN  HFA) 108 (90 Base) MCG/ACT inhaler INHALE 2 INHALATIONS BY MOUTH INTO THE LUNGS EVERY 6 HOURS AS NEEDED FOR WHEEZING   ALPRAZolam  (XANAX ) 0.5 MG tablet TAKE 1/2 TABLET BY MOUTH ONCE DAILY AS NEEDED   AMBULATORY NON FORMULARY MEDICATION Abdominal compression binder   aspirin  EC 81 MG tablet Take 81 mg by mouth as needed.    budesonide -formoterol  (SYMBICORT ) 160-4.5 MCG/ACT inhaler Inhale 2 puffs into the lungs 2 (two) times daily.   Carbidopa -Levodopa  ER (SINEMET  CR) 25-100 MG tablet controlled release Take 2 at 7 AM, 2 at 11 AM, 1 at 4 PM and 2 tablets at bedtime .   cholecalciferol (VITAMIN D3) 25 MCG (1000 UNIT) tablet Take 1,000 Units by mouth daily.   citalopram  (CELEXA ) 10 MG tablet Take 1 tablet (10 mg total) by mouth daily. Take along with 20 mg , total of 30 mg daily   citalopram  (CELEXA ) 20 MG tablet Take 1 tablet (20 mg total) by mouth daily.   COD LIVER OIL PO Take by mouth as needed.   montelukast  (SINGULAIR ) 10  MG tablet TAKE 1 TABLET BY MOUTH AT BEDTIME   mupirocin  ointment (BACTROBAN ) 2 % Apply 1 Application topically 2 (two) times daily.   Spacer/Aero-Holding Chambers DEVI 1 Units by Does not apply route daily.   traZODone  (DESYREL ) 50 MG tablet TAKE 2 TABLETS BY MOUTH AT BEDTIME   vitamin B-12 (CYANOCOBALAMIN ) 100 MCG tablet Take 100 mcg by mouth daily.   vitamin E 180 MG (400 UNITS) capsule Take 400 Units by mouth daily.   No facility-administered encounter medications on file as of 02/24/2024.     Lab Results  Component Value Date   WBC 8.5 07/15/2023   HGB 12.3  07/15/2023   HCT 37.1 07/15/2023   PLT 248.0 07/15/2023   GLUCOSE 114 (H) 12/06/2023   CHOL 188 12/06/2023   TRIG 148 12/06/2023   HDL 56 12/06/2023   LDLDIRECT 163.4 12/31/2012   LDLCALC 106 (H) 12/06/2023   ALT 4 12/06/2023   AST 8 12/06/2023   NA 140 12/06/2023   K 4.5 12/06/2023   CL 103 12/06/2023   CREATININE 0.97 12/06/2023   BUN 17 12/06/2023   CO2 21 12/06/2023   TSH 1.80 04/16/2023   INR 1.0 12/12/2012   HGBA1C 5.7 (H) 12/06/2023    DG Tibia/Fibula Left Result Date: 08/17/2023 CLINICAL DATA:  Swelling, fall. EXAM: LEFT TIBIA AND FIBULA - 2 VIEW COMPARISON:  None Available. FINDINGS: There is no evidence of fracture or other focal bone lesions. No erosive change. Knee arthroplasty is intact. Ankle alignment is maintained. Generalized soft tissue edema. IMPRESSION: 1. Generalized soft tissue edema. 2. No acute osseous findings.  Intact knee arthroplasty. Electronically Signed   By: Andrea Gasman M.D.   On: 08/17/2023 16:41   US  Venous Img Lower Unilateral Left Result Date: 08/17/2023 CLINICAL DATA:  Swelling, fall. EXAM: LEFT LOWER EXTREMITY VENOUS DOPPLER ULTRASOUND TECHNIQUE: Gray-scale sonography with compression, as well as color and duplex ultrasound, were performed to evaluate the deep venous system(s) from the level of the common femoral vein through the popliteal and proximal calf veins. COMPARISON:  Left lower extremity ultrasound 01/30/2022 FINDINGS: VENOUS Normal compressibility of the common femoral, superficial femoral, and popliteal veins, as well as the visualized calf veins. Visualized portions of profunda femoral vein and great saphenous vein unremarkable. No filling defects to suggest DVT on grayscale or color Doppler imaging. Doppler waveforms show normal direction of venous flow, normal respiratory plasticity and response to augmentation. Limited views of the contralateral common femoral vein are unremarkable. OTHER None. Limitations: none IMPRESSION: No  evidence of left lower extremity DVT. Electronically Signed   By: Andrea Gasman M.D.   On: 08/17/2023 16:39       Assessment & Plan:  Swelling of left lower extremity Assessment & Plan: Increased swelling and redness as outlined. Treat with doxycycline . Covered with telfa and wrapped. She has sterile saline to clean the area. Will refer to wound center. Also obtain lower extremity ultrasound to confirm no DVT given pain in lower extremity and associated swelling and redness.   Orders: -     US  Venous Img Lower Unilateral Left (DVT); Future  Parkinson's disease, unspecified whether dyskinesia present, unspecified whether manifestations fluctuate (HCC) Assessment & Plan: Followed by Dr Evonnie. Continues on sinemet . Overall appears to be doing better. Continue f/u with neurology.    Wound of left lower extremity, subsequent encounter Assessment & Plan: Two open wounds and one scabbed smaller wound - left lower leg as outlined. She has sterile saline = to clean the area.  Telfa to cover. Wrapped. Has been using abx ointment. Treat with doxycycline  as directed. Eat yogurt while on the abx. Follow. Referral to wound clinic.   Orders: -     Ambulatory referral to Wound Clinic  Other orders -     Doxycycline  Hyclate; Take 1 tablet (100 mg total) by mouth 2 (two) times daily.  Dispense: 14 tablet; Refill: 0     Allena Hamilton, MD

## 2024-02-24 NOTE — Assessment & Plan Note (Signed)
 Increased swelling and redness as outlined. Treat with doxycycline . Covered with telfa and wrapped. She has sterile saline to clean the area. Will refer to wound center. Also obtain lower extremity ultrasound to confirm no DVT given pain in lower extremity and associated swelling and redness.

## 2024-02-24 NOTE — Assessment & Plan Note (Signed)
 Two open wounds and one scabbed smaller wound - left lower leg as outlined. She has sterile saline = to clean the area. Telfa to cover. Wrapped. Has been using abx ointment. Treat with doxycycline  as directed. Eat yogurt while on the abx. Follow. Referral to wound clinic.

## 2024-02-24 NOTE — Telephone Encounter (Signed)
 FYI patient is coming to see us  today at 2pm. Leg is not any better, wanting abx. She says it is red and swollen. I have cancelled the acute visit scheduled for tomorrow.

## 2024-02-24 NOTE — Telephone Encounter (Signed)
 Patient requests call from Dr. Freda staff if she is able to see her today and/or if she sends an antibiotic, confirmed preferred pharmacy is correct in Epic.  FYI Only or Action Required?: Action required by provider: request for appointment, clinical question for provider, and update on patient condition.  Patient was last seen in primary care on 02/13/2024 by Glendia Shad, MD.  Called Nurse Triage reporting Insect Bite.  Symptoms began a week ago.  Interventions attempted: OTC medications: ibuprofen .  Symptoms are: gradually worsening.  Triage Disposition: See Physician Within 24 Hours  Patient/caregiver understands and will follow disposition?:  Reason for Disposition  [1] Red or very tender (to touch) area AND [2] started over 24 hours after the bite  Answer Assessment - Initial Assessment Questions Patient has been using Mupirocin  for spider bite, not improving. States that wound is not improving. Describes skin as red with discharge on bandage. Patient unable to specify color of discharge. States red around it, denies streaking.  1. TYPE of SPIDER: What type of spider was it?  (e.g., name, unknown, or brief description)     Unknown 2. LOCATION: Where is the bite located?      Left ankle 3. PAIN: Is there any pain? If Yes, ask: How bad is it?  (Scale 1-10; or mild, moderate, severe)     Moderate pain 4. SWELLING: How big is the swelling? (Inches, cm or compare to coins)      Severe swelling/tightness 5. ONSET: When did the bite occur? (e.g., minutes, hours ago)      11 days ago  Protocols used: Spider Bite - [employer]   Copied from [employer] [id number]. Topic: Clinical - Red Word Triage >> Feb 24, 2024  9:16 AM Tiffini S wrote: [facility] that prompted transfer to Nurse Triage: Patient was bite by spider- she was prescribed a cream- is getting worse/ swollen and very red around the area. went to the ER this morning but very full. >> Feb 24, 2024  9:46 AM  DeAngela L wrote: Patient states she used the cream for spider bite on left, and wrapped and cleaned the leg, but it doesn't seem to be getting better and now she theres redness and swelling and she is calling to ask if the provider could prescribe her an antibiotic

## 2024-02-25 ENCOUNTER — Ambulatory Visit: Admitting: Internal Medicine

## 2024-02-25 ENCOUNTER — Ambulatory Visit: Admitting: Nurse Practitioner

## 2024-02-28 ENCOUNTER — Other Ambulatory Visit: Payer: Self-pay | Admitting: Internal Medicine

## 2024-02-29 ENCOUNTER — Other Ambulatory Visit: Payer: Self-pay | Admitting: Internal Medicine

## 2024-03-03 ENCOUNTER — Other Ambulatory Visit: Payer: Self-pay | Admitting: Internal Medicine

## 2024-03-03 ENCOUNTER — Other Ambulatory Visit: Payer: Self-pay | Admitting: Psychiatry

## 2024-03-03 NOTE — Telephone Encounter (Signed)
 I received a request for refill alprazolam  and buspar . Per discussion - She is seeing psychiatry now. Medications being adjusted by them. Let me know if problems. Also, I received a request or refill advair . It appears she is on symbicort . Please confirm.

## 2024-03-03 NOTE — Telephone Encounter (Unsigned)
 Copied from CRM [id number]. Topic: Clinical - Prescription Issue >> Mar 03, 2024  2:32 PM Carlyon D wrote: Reason for CRM: Pt is calling in regards to her xanex pt states pharmacy can't fill script pt is asking if pcp can get this cleared up

## 2024-03-03 NOTE — Telephone Encounter (Signed)
 FYI for you-  I have called [facility] and cancelled all refills on file that should go to Dr Coby (citalopram  10, citalopram  20, xanax ) so that they will no longer be sent to our office. Pharmacy confirmed that patient should not be out of any of these. Refill request for xanax  is being sent to psych and the pharmacy has refills on file for citalopram . She is not on buspar . Pharmacy is aware. She is also not on advair  anymore. She is using symbicort . I have left Victoria Hanson a message to discuss this with her. Please decline any further refills you receive today that seem to be duplicated from this message. She has a f/u with psych on 10/28

## 2024-03-03 NOTE — Telephone Encounter (Signed)
 See other message. No longer taking buspar  and advair . Seeing psych now.

## 2024-03-03 NOTE — Telephone Encounter (Signed)
 See my result note. She is seeing psychiatry now.  Has she contacted them for refills?  They are following her now.

## 2024-03-06 ENCOUNTER — Ambulatory Visit: Admitting: Internal Medicine

## 2024-03-16 ENCOUNTER — Telehealth: Payer: Self-pay

## 2024-03-16 DIAGNOSIS — B351 Tinea unguium: Secondary | ICD-10-CM

## 2024-03-16 NOTE — Telephone Encounter (Signed)
 Copied from CRM [id number]. Topic: Appointments - Scheduling Inquiry for Clinic >> Mar 16, 2024  8:47 AM Henretta I wrote: Reason for RMF:Ejupzwu would like to know if Carley is able to fit her in today at 4:00pm with Dr. Glendia as she has a toe fungus she would like her to look at, patient states it started the day before yesterday. Call back number is [phone], if unable to reach at that number patient would like a call be placed to daughters number [phone]

## 2024-03-16 NOTE — Telephone Encounter (Signed)
 Patient thinks she has toe fungus on her right foot all 5 toe nails and left foot big toenail. She is wondering if she should come here for you to look at them or what she should do. I mentioned to her about seeing podiatry. She told me to ask if that is what you recommended. Pt is not expecting a call back today. Patient is not having any acute symptoms at this time.

## 2024-03-17 NOTE — Telephone Encounter (Signed)
 [facility] for you

## 2024-03-17 NOTE — Telephone Encounter (Signed)
 If agreeable, ok to refer to podiatry. Confirm if has preference.

## 2024-03-18 NOTE — Telephone Encounter (Signed)
Order placed for podiatry referral.   

## 2024-03-20 DIAGNOSIS — L600 Ingrowing nail: Secondary | ICD-10-CM | POA: Diagnosis not present

## 2024-03-20 DIAGNOSIS — M79674 Pain in right toe(s): Secondary | ICD-10-CM | POA: Diagnosis not present

## 2024-03-20 DIAGNOSIS — M79675 Pain in left toe(s): Secondary | ICD-10-CM | POA: Diagnosis not present

## 2024-03-20 DIAGNOSIS — B351 Tinea unguium: Secondary | ICD-10-CM | POA: Diagnosis not present

## 2024-03-24 ENCOUNTER — Ambulatory Visit: Admitting: Physician Assistant

## 2024-03-25 ENCOUNTER — Other Ambulatory Visit: Payer: Self-pay | Admitting: Psychiatry

## 2024-03-25 DIAGNOSIS — F32A Depression, unspecified: Secondary | ICD-10-CM

## 2024-03-25 DIAGNOSIS — F411 Generalized anxiety disorder: Secondary | ICD-10-CM

## 2024-03-26 ENCOUNTER — Other Ambulatory Visit: Payer: Self-pay

## 2024-03-26 DIAGNOSIS — G20B1 Parkinson's disease with dyskinesia, without mention of fluctuations: Secondary | ICD-10-CM

## 2024-03-26 MED ORDER — CARBIDOPA-LEVODOPA ER 25-100 MG PO TBCR: EXTENDED_RELEASE_TABLET | ORAL | 1 refills | Status: AC

## 2024-03-30 ENCOUNTER — Other Ambulatory Visit: Payer: Self-pay | Admitting: Internal Medicine

## 2024-04-01 ENCOUNTER — Ambulatory Visit: Admitting: Physician Assistant

## 2024-04-02 ENCOUNTER — Encounter: Payer: Self-pay | Admitting: Internal Medicine

## 2024-04-02 ENCOUNTER — Ambulatory Visit (INDEPENDENT_AMBULATORY_CARE_PROVIDER_SITE_OTHER): Admitting: Internal Medicine

## 2024-04-02 VITALS — BP 118/74 | HR 68 | Temp 98.2°F | Ht 63.0 in | Wt 183.8 lb

## 2024-04-02 DIAGNOSIS — I700 Atherosclerosis of aorta: Secondary | ICD-10-CM

## 2024-04-02 DIAGNOSIS — I10 Essential (primary) hypertension: Secondary | ICD-10-CM | POA: Diagnosis not present

## 2024-04-02 DIAGNOSIS — N183 Chronic kidney disease, stage 3 unspecified: Secondary | ICD-10-CM | POA: Diagnosis not present

## 2024-04-02 DIAGNOSIS — I779 Disorder of arteries and arterioles, unspecified: Secondary | ICD-10-CM | POA: Diagnosis not present

## 2024-04-02 DIAGNOSIS — Z85038 Personal history of other malignant neoplasm of large intestine: Secondary | ICD-10-CM

## 2024-04-02 DIAGNOSIS — Z23 Encounter for immunization: Secondary | ICD-10-CM | POA: Diagnosis not present

## 2024-04-02 DIAGNOSIS — Z1231 Encounter for screening mammogram for malignant neoplasm of breast: Secondary | ICD-10-CM

## 2024-04-02 DIAGNOSIS — F320 Major depressive disorder, single episode, mild: Secondary | ICD-10-CM

## 2024-04-02 DIAGNOSIS — R739 Hyperglycemia, unspecified: Secondary | ICD-10-CM

## 2024-04-02 DIAGNOSIS — E7800 Pure hypercholesterolemia, unspecified: Secondary | ICD-10-CM

## 2024-04-02 DIAGNOSIS — M7989 Other specified soft tissue disorders: Secondary | ICD-10-CM

## 2024-04-02 DIAGNOSIS — G20A1 Parkinson's disease without dyskinesia, without mention of fluctuations: Secondary | ICD-10-CM | POA: Diagnosis not present

## 2024-04-02 DIAGNOSIS — E559 Vitamin D deficiency, unspecified: Secondary | ICD-10-CM

## 2024-04-02 DIAGNOSIS — D649 Anemia, unspecified: Secondary | ICD-10-CM | POA: Diagnosis not present

## 2024-04-02 DIAGNOSIS — Z8589 Personal history of malignant neoplasm of other organs and systems: Secondary | ICD-10-CM

## 2024-04-02 NOTE — Progress Notes (Signed)
 Subjective:    Patient ID: Victoria Hanson, female    DOB: May 23, 1951, 73 y.o.   MRN: [id number]  Patient here for  Chief Complaint  Patient presents with   Foot Problem    Patient states she did see the Podiatrist for the Toe Fungus and it has gotten better.    Medication Management    HPI Here for a scheduled follow up - follow up regarding hypertension, hypercholesterolemia, lower extremity swelling and parkinsons. Swelling is better. Breathing stable. No chest pain. No abdominal pain. Bowels moving. Has essentially stopped taking xanax . She did take 1/2 last night, but this is the first xanax  in a while. She was questioning if lyrica would help her relax. Still taking trazodone . Has f/u next week with Dr Coby. Informed her to discuss with Dr Coby. No abdominal pain or bowel change reported.    Past Medical History:  Diagnosis Date   Anxiety    Asthma    Atypical chest pain    a. 2003 Cath: reportedly nl per pt Cloretta);  b. 04/2015 [facility] Admission, neg troponin.   Colon cancer (HCC) 1999   a. 1999: Pt had partial colectomy. Did develop lung metastasis. Had right upper lobectomy in 01/2007 then had associated chemotherapy.   COPD (chronic obstructive pulmonary disease) (HCC)    DDD (degenerative disc disease), lumbosacral    Depression    Echocardiogram abnormal    a. 02/2010 Echo: EF 55-60%, mild LAE, no regional wall motion abnormalities   Elevated transaminase level    a. ? NASH;  b. 06/2015 - nl LFTs.   GERD (gastroesophageal reflux disease)    Hematuria    Holter monitor, abnormal    a. 12/2009 Holter monitoring: NSR, rare PACs and PVCs.   Hypercholesterolemia    Hyperlipidemia    Hypertension    Leaky heart valve    a. Per pt report - no evidence of valvular abnormalities on prior echoes.   Lesion of skin of Right breast 10/20/2017   Lung metastases 2008   a. S/P resection (lobectomy - right)   Morbid obesity (HCC)    Nephrolithiasis    pt denies   Parkinson  disease (HCC) 08/2016   [name]   Past Surgical History:  Procedure Laterality Date   BREAST EXCISIONAL BIOPSY Right 10/07/2009   benign   CARDIAC CATHETERIZATION  2003   negative as per pt report   CHOLECYSTECTOMY  2000   COLON SURGERY     Colon cancer, removed part of large colon   COLONOSCOPY N/A 04/25/2015   Procedure: COLONOSCOPY;  Surgeon: Lamar ONEIDA Holmes, MD;  Location: [facility] ENDOSCOPY;  Service: Endoscopy;  Laterality: N/A;   COLONOSCOPY WITH PROPOFOL  N/A 06/14/2017   Procedure: COLONOSCOPY WITH PROPOFOL ;  Surgeon: Holmes Lamar ONEIDA, MD;  Location: [facility] ENDOSCOPY;  Service: Endoscopy;  Laterality: N/A;   COLONOSCOPY WITH PROPOFOL  N/A 11/17/2019   Procedure: COLONOSCOPY WITH PROPOFOL ;  Surgeon: Therisa Bi, MD;  Location: [facility] ENDOSCOPY;  Service: Gastroenterology;  Laterality: N/A;   COLONOSCOPY WITH PROPOFOL  N/A 03/21/2021   Procedure: COLONOSCOPY WITH PROPOFOL ;  Surgeon: Maryruth Ole ONEIDA, MD;  Location: [facility] ENDOSCOPY;  Service: Endoscopy;  Laterality: N/A;   ESOPHAGOGASTRODUODENOSCOPY (EGD) WITH PROPOFOL  N/A 03/21/2021   Procedure: ESOPHAGOGASTRODUODENOSCOPY (EGD) WITH PROPOFOL ;  Surgeon: Maryruth Ole ONEIDA, MD;  Location: [facility] ENDOSCOPY;  Service: Endoscopy;  Laterality: N/A;   LOBECTOMY  01/2007   Right lung, upper lobe right side removed   REPLACEMENT TOTAL KNEE Left 08/2020   TOTAL ABDOMINAL HYSTERECTOMY  abnormal bleeding   TUBAL LIGATION  1980   Family History  Problem Relation Age of Onset   Coronary artery disease Mother        died @ 53 of MI.   Mental illness Mother    Diabetes Mother    Heart attack Father 56       MI   Cancer Father        lung - died in his mid-70's   Hypertension Father    Diabetes Father    Sudden death Brother    Mental illness Brother    Heart attack Brother        MI @ 20, died @ age 88.   Hypertension Child    Drug abuse Son    Drug abuse Son    Breast cancer Neg Hx    Social History   Socioeconomic History    Marital status: Widowed    Spouse name: Not on file   Number of children: 3   Years of education: Not on file   Highest education level: Some college, no degree  Occupational History   Not on file  Tobacco Use   Smoking status: Never    Passive exposure: Yes   Smokeless tobacco: Never   Tobacco comments:    husband and son smoked in her home.  Vaping Use   Vaping status: Never Used  Substance and Sexual Activity   Alcohol use: Not Currently    Alcohol/week: 0.0 standard drinks of alcohol   Drug use: No   Sexual activity: Not Currently  Other Topics Concern   Not on file  Social History Narrative   Widowed.   Disabled (colon/lung CA), retired.   Lives in [place] with her dtr, grand-dtr, and son.   Active.   Gets regular exercise, walks.   Right handed    Social Drivers of Health   Financial Resource Strain: Patient Declined (03/20/2024)   Received from [facility]   Overall Financial Resource Strain (CARDIA)    Difficulty of Paying Living Expenses: Patient declined  Food Insecurity: Patient Declined (03/20/2024)   Received from [facility]   Hunger Vital Sign    Within the past 12 months, you worried that your food would run out before you got the money to buy more.: Patient declined    Within the past 12 months, the food you bought just didn't last and you didn't have money to get more.: Patient declined  Transportation Needs: Patient Declined (03/20/2024)   Received from [facility] - Transportation    In the past 12 months, has lack of transportation kept you from medical appointments or from getting medications?: Patient declined    Lack of Transportation (Non-Medical): Patient declined  Physical Activity: Inactive (01/08/2024)   Exercise Vital Sign    Days of Exercise per Week: 0 days    Minutes of Exercise per Session: 0 min  Stress: Stress Concern Present (01/08/2024)   [employer] of  Occupational Health - Occupational Stress Questionnaire    Feeling of Stress: To some extent  Social Connections: Moderately Integrated (01/08/2024)   Social Connection and Isolation Panel    Frequency of Communication with Friends and Family: More than three times a week    Frequency of Social Gatherings with Friends and Family: Three times a week    Attends Religious Services: More than 4 times per year    Active Member of Clubs or Organizations: Yes  Attends Banker Meetings: More than 4 times per year    Marital Status: Widowed     Review of Systems  Constitutional:  Negative for appetite change and unexpected weight change.  HENT:  Negative for congestion and sinus pressure.   Respiratory:  Negative for cough, chest tightness and shortness of breath.   Cardiovascular:  Negative for chest pain and palpitations.       Swelling improved.   Gastrointestinal:  Negative for abdominal pain, diarrhea, nausea and vomiting.  Genitourinary:  Negative for difficulty urinating and dysuria.  Musculoskeletal:  Negative for joint swelling and myalgias.  Skin:  Negative for color change and rash.  Neurological:  Negative for dizziness and headaches.  Psychiatric/Behavioral:  Negative for agitation and dysphoric mood.        Objective:     BP 118/74 (Cuff Size: Normal)   Pulse 68   Temp 98.2 F (36.8 C) (Oral)   Ht 5' 3 (1.6 m)   Wt 183 lb 12.8 oz (83.4 kg)   SpO2 96%   BMI 32.56 kg/m  Wt Readings from Last 3 Encounters:  04/02/24 183 lb 12.8 oz (83.4 kg)  02/24/24 183 lb 9.6 oz (83.3 kg)  02/13/24 181 lb 9.6 oz (82.4 kg)    Physical Exam Vitals reviewed.  Constitutional:      General: She is not in acute distress.    Appearance: Normal appearance.  HENT:     Head: Normocephalic and atraumatic.     Right Ear: External ear normal.     Left Ear: External ear normal.     Mouth/Throat:     Pharynx: No oropharyngeal exudate or posterior oropharyngeal erythema.   Eyes:     General: No scleral icterus.       Right eye: No discharge.        Left eye: No discharge.     Conjunctiva/sclera: Conjunctivae normal.  Neck:     Thyroid : No thyromegaly.  Cardiovascular:     Rate and Rhythm: Normal rate and regular rhythm.  Pulmonary:     Effort: No respiratory distress.     Breath sounds: Normal breath sounds. No wheezing.  Abdominal:     General: Bowel sounds are normal.     Palpations: Abdomen is soft.     Tenderness: There is no abdominal tenderness.  Musculoskeletal:        General: No tenderness.     Cervical back: Neck supple. No tenderness.     Comments: Lower extremity swelling improved.   Lymphadenopathy:     Cervical: No cervical adenopathy.  Skin:    Findings: No erythema or rash.  Neurological:     Mental Status: She is alert.  Psychiatric:        Mood and Affect: Mood normal.        Behavior: Behavior normal.         Outpatient Encounter Medications as of 04/02/2024  Medication Sig   albuterol  (VENTOLIN  HFA) 108 (90 Base) MCG/ACT inhaler INHALE 2 INHALATIONS BY MOUTH INTO THE LUNGS EVERY 6 HOURS AS NEEDED FOR WHEEZING   ALPRAZolam  (XANAX ) 0.5 MG tablet TAKE 1/2 TABLET BY MOUTH ONCE DAILY AS NEEDED   AMBULATORY NON FORMULARY MEDICATION Abdominal compression binder   aspirin  EC 81 MG tablet Take 81 mg by mouth as needed.    budesonide -formoterol  (SYMBICORT ) 160-4.5 MCG/ACT inhaler Inhale 2 puffs into the lungs 2 (two) times daily.   Carbidopa -Levodopa  ER (SINEMET  CR) 25-100 MG tablet controlled release Take 2 at  7 AM, 2 at 11 AM, 1 at 4 PM and 2 tablets at bedtime .   cholecalciferol (VITAMIN D3) 25 MCG (1000 UNIT) tablet Take 1,000 Units by mouth daily.   citalopram  (CELEXA ) 10 MG tablet TAKE 1 TABLET BY MOUTH DAILY. TAKE ALONGWITH CITALOPRAM  20 MG FOR A TOTAL OF 30 MG DAILY.   citalopram  (CELEXA ) 20 MG tablet TAKE 1 TABLET BY MOUTH DAILY   COD LIVER OIL PO Take by mouth as needed.   doxycycline  (VIBRA -TABS) 100 MG tablet Take  1 tablet (100 mg total) by mouth 2 (two) times daily.   montelukast  (SINGULAIR ) 10 MG tablet TAKE 1 TABLET BY MOUTH AT BEDTIME   mupirocin  ointment (BACTROBAN ) 2 % Apply 1 Application topically 2 (two) times daily.   Spacer/Aero-Holding Chambers DEVI 1 Units by Does not apply route daily.   traZODone  (DESYREL ) 50 MG tablet TAKE 2 TABLETS BY MOUTH AT BEDTIME   vitamin B-12 (CYANOCOBALAMIN ) 100 MCG tablet Take 100 mcg by mouth daily.   vitamin E 180 MG (400 UNITS) capsule Take 400 Units by mouth daily.   No facility-administered encounter medications on file as of 04/02/2024.     Lab Results  Component Value Date   WBC 8.5 07/15/2023   HGB 12.3 07/15/2023   HCT 37.1 07/15/2023   PLT 248.0 07/15/2023   GLUCOSE 117 (H) 04/02/2024   CHOL 190 04/02/2024   TRIG 203.0 (H) 04/02/2024   HDL 53.40 04/02/2024   LDLDIRECT 163.4 12/31/2012   LDLCALC 96 04/02/2024   ALT 2 04/02/2024   AST 10 04/02/2024   NA 138 04/02/2024   K 4.1 04/02/2024   CL 102 04/02/2024   CREATININE 1.04 04/02/2024   BUN 21 04/02/2024   CO2 25 04/02/2024   TSH 1.80 04/16/2023   INR 1.0 12/12/2012   HGBA1C 5.9 04/02/2024    US  Venous Img Lower Unilateral Left Result Date: 02/24/2024 CLINICAL DATA:  left lower extremity swelling with redness. EXAM: LEFT LOWER EXTREMITY VENOUS DOPPLER ULTRASOUND TECHNIQUE: Gray-scale sonography with graded compression, as well as color Doppler and duplex ultrasound were performed to evaluate the lower extremity deep venous systems from the level of the common femoral vein and including the common femoral, femoral, profunda femoral, popliteal and calf veins including the posterior tibial, peroneal and gastrocnemius veins when visible. The superficial great saphenous vein was also interrogated. Spectral Doppler was utilized to evaluate flow at rest and with distal augmentation maneuvers in the common femoral, femoral and popliteal veins. COMPARISON:  None Available. FINDINGS: Contralateral  Common Femoral Vein: Respiratory phasicity is normal and symmetric with the symptomatic side. No evidence of thrombus. Normal compressibility. Common Femoral Vein: No evidence of thrombus. Normal compressibility, respiratory phasicity and response to augmentation. Saphenofemoral Junction: No evidence of thrombus. Normal compressibility and flow on color Doppler imaging. Profunda Femoral Vein: No evidence of thrombus. Normal compressibility and flow on color Doppler imaging. Femoral Vein: No evidence of thrombus. Normal compressibility, respiratory phasicity and response to augmentation. Popliteal Vein: No evidence of thrombus. Normal compressibility, respiratory phasicity and response to augmentation. Calf Veins: Limited assessment of the calf veins. No gross significant thrombus appreciated. IMPRESSION: No significant left lower extremity femoropopliteal DVT. Limited assessment of the calf veins. Electronically Signed   By: CHRISTELLA.  Shick M.D.   On: 02/24/2024 16:30       Assessment & Plan:  Primary hypertension Assessment & Plan: Blood pressure as outlined. Follow pressures. Follow metabolic panel.   Orders: -     Basic metabolic panel  with GFR  Hyperglycemia Assessment & Plan: Low carb diet and exercise. Follow met b and A1c.   Orders: -     Hemoglobin A1c  Hypercholesterolemia Assessment & Plan: Off crestor . Has desired not to take. Low cholesterol diet and exercise. Follow lipid panel.  Orders: -     Hepatic function panel -     Lipid panel  Encounter for screening mammogram for malignant neoplasm of breast -     3D Screening Mammogram, Left and Right; Future  Needs flu shot -     Flu vaccine HIGH DOSE PF(Fluzone Trivalent)  Vitamin D  deficiency Assessment & Plan: Vitamin D  level 04/16/23 - wnl.    Swelling of lower extremity Assessment & Plan: Stable. Has seen [facility]. Compression hose. Swelling improved. Follow.    Pure hypercholesterolemia Assessment & Plan: Off crestor .  Did not tolerate. Low cholesterol diet and exercise. Follow lipid panel and liver function tests.    Parkinson's disease, unspecified whether dyskinesia present, unspecified whether manifestations fluctuate (HCC) Assessment & Plan: Followed by Dr Evonnie. Continues on sinemet . Overall appears to be doing better. Continue f/u with neurology.    History of malignant neoplasm metastatic to lung Assessment & Plan: S/p lobectomy.  Followed by pulmonary.  Albuterol  inhaler if needed. Breathing stable.    History of colon cancer Assessment & Plan:  Saw GI 12/20/22 - f/u colon cancer and anemia.  Has desired no f/u colonoscopy.    Depression, major, single episode, mild Assessment & Plan: Continues on citalopram . Saw psychiatry. Increased citalopram  to 30mg . Has cut down xanax  significantly. Continue f/u with psychiatry.    Stage 3 chronic kidney disease, unspecified whether stage 3a or 3b CKD (HCC) Assessment & Plan: Continue to avoid antiinflammatory medication. Off micardis  due to low pressures. Blood pressure as outlined. No additional medication at this time. Follow pressure. Follow metabolic panel.    Bilateral carotid artery disease, unspecified type Assessment & Plan: Off cholesterol medication. Blood pressure doing well. Continue aspirin .    Anemia, unspecified type Assessment & Plan: Follow cbc.    Aortic atherosclerosis Assessment & Plan: Continue aspirin . She is off cholesterol medication.       Allena Hamilton, MD

## 2024-04-03 LAB — BASIC METABOLIC PANEL WITH GFR
BUN: 21 mg/dL (ref 6–23)
CO2: 25 meq/L (ref 19–32)
Calcium: 8.9 mg/dL (ref 8.4–10.5)
Chloride: 102 meq/L (ref 96–112)
Creatinine, Ser: 1.04 mg/dL (ref 0.40–1.20)
GFR: 53 mL/min — ABNORMAL LOW (ref >60.00)
Glucose, Bld: 117 mg/dL — ABNORMAL HIGH (ref 70–99)
Potassium: 4.1 meq/L (ref 3.5–5.1)
Sodium: 138 meq/L (ref 135–145)

## 2024-04-03 LAB — LIPID PANEL
Cholesterol: 190 mg/dL (ref 0–200)
HDL: 53.4 mg/dL (ref >39.00)
LDL Cholesterol: 96 mg/dL (ref 0–99)
NonHDL: 136.92
Total CHOL/HDL Ratio: 4
Triglycerides: 203 mg/dL — ABNORMAL HIGH (ref 0.0–149.0)
VLDL: 40.6 mg/dL — ABNORMAL HIGH (ref 0.0–40.0)

## 2024-04-03 LAB — HEPATIC FUNCTION PANEL
ALT: 2 U/L (ref 0–35)
AST: 10 U/L (ref 0–37)
Albumin: 3.8 g/dL (ref 3.5–5.2)
Alkaline Phosphatase: 80 U/L (ref 39–117)
Bilirubin, Direct: 0.1 mg/dL (ref 0.0–0.3)
Total Bilirubin: 0.2 mg/dL (ref 0.2–1.2)
Total Protein: 6.5 g/dL (ref 6.0–8.3)

## 2024-04-03 LAB — HEMOGLOBIN A1C: Hgb A1c MFr Bld: 5.9 % (ref 4.6–6.5)

## 2024-04-05 ENCOUNTER — Encounter: Payer: Self-pay | Admitting: Internal Medicine

## 2024-04-05 ENCOUNTER — Ambulatory Visit: Payer: Self-pay | Admitting: Internal Medicine

## 2024-04-05 NOTE — Assessment & Plan Note (Signed)
 S/p lobectomy.  Followed by pulmonary.  Albuterol inhaler if needed. Breathing stable.

## 2024-04-05 NOTE — Assessment & Plan Note (Signed)
Blood pressure as outlined.  Follow pressures.  Follow metabolic panel.

## 2024-04-05 NOTE — Assessment & Plan Note (Signed)
 Continue aspirin . She is off cholesterol medication.

## 2024-04-05 NOTE — Assessment & Plan Note (Signed)
 Follow cbc.

## 2024-04-05 NOTE — Assessment & Plan Note (Signed)
 Saw GI 12/20/22 - f/u colon cancer and anemia.  Has desired no f/u colonoscopy.

## 2024-04-05 NOTE — Assessment & Plan Note (Signed)
 Continues on citalopram . Saw psychiatry. Increased citalopram  to 30mg . Has cut down xanax  significantly. Continue f/u with psychiatry.

## 2024-04-05 NOTE — Assessment & Plan Note (Signed)
 Followed by Dr Evonnie. Continues on sinemet . Overall appears to be doing better. Continue f/u with neurology.

## 2024-04-05 NOTE — Assessment & Plan Note (Signed)
 Off crestor. Did not tolerate. Low cholesterol diet and exercise. Follow lipid panel and liver function tests.

## 2024-04-05 NOTE — Assessment & Plan Note (Addendum)
 Stable. Has seen [facility]. Compression hose. Swelling improved. Follow.

## 2024-04-05 NOTE — Assessment & Plan Note (Signed)
 Continue to avoid antiinflammatory medication. Off micardis  due to low pressures. Blood pressure as outlined. No additional medication at this time. Follow pressure. Follow metabolic panel.

## 2024-04-05 NOTE — Assessment & Plan Note (Signed)
 Off crestor . Has desired not to take. Low cholesterol diet and exercise. Follow lipid panel.

## 2024-04-05 NOTE — Assessment & Plan Note (Signed)
 Off cholesterol medication. Blood pressure doing well. Continue aspirin .

## 2024-04-05 NOTE — Assessment & Plan Note (Signed)
 Low-carb diet and exercise.  Follow met b and A1c.

## 2024-04-05 NOTE — Assessment & Plan Note (Signed)
 Vitamin D  level 04/16/23 - wnl.

## 2024-04-07 ENCOUNTER — Ambulatory Visit: Admitting: Psychiatry

## 2024-04-16 ENCOUNTER — Telehealth: Payer: Self-pay | Admitting: Psychiatry

## 2024-04-16 NOTE — Telephone Encounter (Signed)
 I called patient this morning to inform her that due to her family members being patient's of yours and Dr. Giovanni and with Dorn leaving the practice we do not have another provider that can see her. She understood and said she will contact her PCP for a new referral or to continue her meds.

## 2024-04-16 NOTE — Telephone Encounter (Signed)
 Noted

## 2024-04-23 ENCOUNTER — Other Ambulatory Visit: Payer: Self-pay | Admitting: Internal Medicine

## 2024-04-23 DIAGNOSIS — F411 Generalized anxiety disorder: Secondary | ICD-10-CM

## 2024-04-24 NOTE — Telephone Encounter (Signed)
 Is being followed by Dr Eappen now. Please notify pharmacy and pt - Dr Coby is managing citalopram  now. Let me know if a problem. I do not want her to run out.

## 2024-04-27 ENCOUNTER — Telehealth: Payer: Self-pay | Admitting: Psychiatry

## 2024-04-27 NOTE — Telephone Encounter (Signed)
 I have approved 30-day supply of Celexa  20 mg.  Patient no longer under the care of of this provider and will need to follow-up with primary care provider or establish care with a new psychiatrist.  Patient is aware of this as well.

## 2024-04-28 NOTE — Telephone Encounter (Signed)
 Discussed with Dr Coby. Pt has appt with me 04/2024. D/w her then

## 2024-04-29 ENCOUNTER — Telehealth: Payer: Self-pay

## 2024-04-29 NOTE — Telephone Encounter (Signed)
 Pt has appt tomorrow at 4:00. Will print off to discuss at visit.

## 2024-04-29 NOTE — Telephone Encounter (Signed)
 Copied from CRM [id number]. Topic: Clinical - Medication Question >> Apr 29, 2024 11:19 AM Alexandria E wrote: Reason for CRM: Dawn from [employer] called in stating that they keep getting requests to fill patient's citalopram  (CELEXA ) 10 MG tablet and 20 MG tablet. Dawn stated that the patient is no longer being seen by the provider that initially prescribed this medication, but also stated that PCP has filled in the past. Questioning if PCP will be able to fill this medication moving forward.

## 2024-04-29 NOTE — Telephone Encounter (Signed)
 open in error

## 2024-04-29 NOTE — Telephone Encounter (Signed)
 Left voicemail to see if patient has enough until her appt tomorrow afternoon with Dr Glendia.   If not, okay to refill per Dr Glendia

## 2024-04-29 NOTE — Telephone Encounter (Signed)
 I am ok to refill citalopram . I do not want her to run out.  Will d/w her regaring f/u with another psychiatrist.

## 2024-04-30 ENCOUNTER — Ambulatory Visit (INDEPENDENT_AMBULATORY_CARE_PROVIDER_SITE_OTHER): Admitting: Internal Medicine

## 2024-04-30 VITALS — BP 110/72 | HR 64 | Temp 97.3°F | Ht 63.0 in | Wt 185.5 lb

## 2024-04-30 DIAGNOSIS — F32A Depression, unspecified: Secondary | ICD-10-CM

## 2024-04-30 DIAGNOSIS — G20A1 Parkinson's disease without dyskinesia, without mention of fluctuations: Secondary | ICD-10-CM

## 2024-04-30 DIAGNOSIS — I10 Essential (primary) hypertension: Secondary | ICD-10-CM

## 2024-04-30 DIAGNOSIS — G903 Multi-system degeneration of the autonomic nervous system: Secondary | ICD-10-CM | POA: Diagnosis not present

## 2024-04-30 DIAGNOSIS — I773 Arterial fibromuscular dysplasia: Secondary | ICD-10-CM

## 2024-04-30 DIAGNOSIS — H539 Unspecified visual disturbance: Secondary | ICD-10-CM

## 2024-04-30 DIAGNOSIS — K760 Fatty (change of) liver, not elsewhere classified: Secondary | ICD-10-CM

## 2024-04-30 DIAGNOSIS — R739 Hyperglycemia, unspecified: Secondary | ICD-10-CM | POA: Diagnosis not present

## 2024-04-30 DIAGNOSIS — E7800 Pure hypercholesterolemia, unspecified: Secondary | ICD-10-CM

## 2024-04-30 DIAGNOSIS — N183 Chronic kidney disease, stage 3 unspecified: Secondary | ICD-10-CM

## 2024-04-30 DIAGNOSIS — F411 Generalized anxiety disorder: Secondary | ICD-10-CM | POA: Diagnosis not present

## 2024-04-30 DIAGNOSIS — D649 Anemia, unspecified: Secondary | ICD-10-CM

## 2024-04-30 DIAGNOSIS — F320 Major depressive disorder, single episode, mild: Secondary | ICD-10-CM

## 2024-04-30 DIAGNOSIS — I779 Disorder of arteries and arterioles, unspecified: Secondary | ICD-10-CM

## 2024-04-30 DIAGNOSIS — Z85038 Personal history of other malignant neoplasm of large intestine: Secondary | ICD-10-CM

## 2024-04-30 DIAGNOSIS — Z8589 Personal history of malignant neoplasm of other organs and systems: Secondary | ICD-10-CM

## 2024-04-30 DIAGNOSIS — E782 Mixed hyperlipidemia: Secondary | ICD-10-CM

## 2024-04-30 NOTE — Telephone Encounter (Signed)
 Victoria Hanson

## 2024-04-30 NOTE — Progress Notes (Addendum)
 Subjective:    Patient ID: Victoria Hanson, female    DOB: [birth year], 73 y.o.   MRN: [id number]  Patient here for  Chief Complaint  Patient presents with  . Medical Management of Chronic Issues    4 wk f/u    HPI Here for a scheduled follow up - follow up regarding hypertension, hypercholesterolemia, lower extremity swelling and parkinsons. Had f/u with ortho 04/15/24 - right knee pain. S/p gel injection. F/u 04/21/24 - second gel injection. Breathing overall stable. No chest pain or sob reported. No abdominal pain or bowel change reported. She does report noticing previous left side/temporal pain. No headache, just pain localized to the one area. She then noticed - some letter seem to disappear. Did not lose vision.  Was not persistent. Symptoms completely resolved. No headache. No head pain since. No left temporal pain. No further vision change. No increased heart rate or palpitations.    Past Medical History:  Diagnosis Date  . Anxiety   . Asthma   . Atypical chest pain    a. 2003 Cath: reportedly nl per pt Cloretta);  b. 04/2015 [facility] Admission, neg troponin.  . Colon cancer (HCC) 1999   a. 1999: Pt had partial colectomy. Did develop lung metastasis. Had right upper lobectomy in 01/2007 then had associated chemotherapy.  SABRA COPD (chronic obstructive pulmonary disease) (HCC)   . DDD (degenerative disc disease), lumbosacral   . Depression   . Echocardiogram abnormal    a. 02/2010 Echo: EF 55-60%, mild LAE, no regional wall motion abnormalities  . Elevated transaminase level    a. ? NASH;  b. 06/2015 - nl LFTs.  . GERD (gastroesophageal reflux disease)   . Hematuria   . Holter monitor, abnormal    a. 12/2009 Holter monitoring: NSR, rare PACs and PVCs.  SABRA Hypercholesterolemia   . Hyperlipidemia   . Hypertension   . Leaky heart valve    a. Per pt report - no evidence of valvular abnormalities on prior echoes.  . Lesion of skin of Right breast 10/20/2017  . Lung metastases 2008    a. S/P resection (lobectomy - right)  . Morbid obesity (HCC)   . Nephrolithiasis    pt denies  . Parkinson disease (HCC) 08/2016   [name]   Past Surgical History:  Procedure Laterality Date  . BREAST EXCISIONAL BIOPSY Right 10/07/2009   benign  . CARDIAC CATHETERIZATION  2003   negative as per pt report  . CHOLECYSTECTOMY  2000  . COLON SURGERY     Colon cancer, removed part of large colon  . COLONOSCOPY N/A 04/25/2015   Procedure: COLONOSCOPY;  Surgeon: Lamar ONEIDA Holmes, MD;  Location: [facility] ENDOSCOPY;  Service: Endoscopy;  Laterality: N/A;  . COLONOSCOPY WITH PROPOFOL  N/A 06/14/2017   Procedure: COLONOSCOPY WITH PROPOFOL ;  Surgeon: Holmes Lamar ONEIDA, MD;  Location: [facility] ENDOSCOPY;  Service: Endoscopy;  Laterality: N/A;  . COLONOSCOPY WITH PROPOFOL  N/A 11/17/2019   Procedure: COLONOSCOPY WITH PROPOFOL ;  Surgeon: Therisa Bi, MD;  Location: [facility] ENDOSCOPY;  Service: Gastroenterology;  Laterality: N/A;  . COLONOSCOPY WITH PROPOFOL  N/A 03/21/2021   Procedure: COLONOSCOPY WITH PROPOFOL ;  Surgeon: Maryruth Ole ONEIDA, MD;  Location: [facility] ENDOSCOPY;  Service: Endoscopy;  Laterality: N/A;  . ESOPHAGOGASTRODUODENOSCOPY (EGD) WITH PROPOFOL  N/A 03/21/2021   Procedure: ESOPHAGOGASTRODUODENOSCOPY (EGD) WITH PROPOFOL ;  Surgeon: Maryruth Ole ONEIDA, MD;  Location: [facility] ENDOSCOPY;  Service: Endoscopy;  Laterality: N/A;  . LOBECTOMY  01/2007   Right lung, upper lobe right side removed  .  REPLACEMENT TOTAL KNEE Left 08/2020  . TOTAL ABDOMINAL HYSTERECTOMY     abnormal bleeding  . TUBAL LIGATION  1980   Family History  Problem Relation Age of Onset  . Coronary artery disease Mother        died @ 67 of MI.  . Mental illness Mother   . Diabetes Mother   . Heart attack Father 60       MI  . Cancer Father        lung - died in his mid-70's  . Hypertension Father   . Diabetes Father   . Sudden death Brother   . Mental illness Brother   . Heart attack Brother        MI @ 57, died @ age 29.   SABRA Hypertension Child   . Drug abuse Son   . Drug abuse Son   . Breast cancer Neg Hx    Social History   Socioeconomic History  . Marital status: Widowed    Spouse name: Not on file  . Number of children: 3  . Years of education: Not on file  . Highest education level: Some college, no degree  Occupational History  . Not on file  Tobacco Use  . Smoking status: Never    Passive exposure: Yes  . Smokeless tobacco: Never  . Tobacco comments:    husband and son smoked in her home.  Vaping Use  . Vaping status: Never Used  Substance and Sexual Activity  . Alcohol use: Not Currently    Alcohol/week: 0.0 standard drinks of alcohol  . Drug use: Yes  . Sexual activity: Not Currently  Other Topics Concern  . Not on file  Social History Narrative   Widowed.   Disabled (colon/lung CA), retired.   Lives in [place] with her dtr, grand-dtr, and son.   Active.   Gets regular exercise, walks.   Right handed    Social Drivers of Health   Financial Resource Strain: Patient Declined (03/20/2024)   Received from [facility]   Overall Financial Resource Strain (CARDIA)   . Difficulty of Paying Living Expenses: Patient declined  Food Insecurity: Patient Declined (03/20/2024)   Received from [facility]   Hunger Vital Sign   . Within the past 12 months, you worried that your food would run out before you got the money to buy more.: Patient declined   . Within the past 12 months, the food you bought just didn't last and you didn't have money to get more.: Patient declined  Transportation Needs: Patient Declined (03/20/2024)   Received from [facility]   PRAPARE - Transportation   . In the past 12 months, has lack of transportation kept you from medical appointments or from getting medications?: Patient declined   . Lack of Transportation (Non-Medical): Patient declined  Physical Activity: Inactive (01/08/2024)   Exercise Vital  Sign   . Days of Exercise per Week: 0 days   . Minutes of Exercise per Session: 0 min  Stress: Stress Concern Present (01/08/2024)   [employer] of Occupational Health - Occupational Stress Questionnaire   . Feeling of Stress: To some extent  Social Connections: Moderately Integrated (01/08/2024)   Social Connection and Isolation Panel   . Frequency of Communication with Friends and Family: More than three times a week   . Frequency of Social Gatherings with Friends and Family: Three times a week   . Attends Religious Services: More than 4  times per year   . Active Member of Clubs or Organizations: Yes   . Attends Banker Meetings: More than 4 times per year   . Marital Status: Widowed     Review of Systems  Constitutional:  Negative for appetite change, fever and unexpected weight change.  HENT:  Negative for congestion and sinus pressure.   Respiratory:  Negative for cough, chest tightness and shortness of breath.   Cardiovascular:  Negative for chest pain and palpitations.       No increased swelling.   Gastrointestinal:  Negative for abdominal pain, diarrhea, nausea and vomiting.  Genitourinary:  Negative for difficulty urinating and dysuria.  Musculoskeletal:  Negative for joint swelling and myalgias.  Skin:  Negative for color change and rash.  Neurological:  Negative for dizziness.       No headache or head pain currently.   Psychiatric/Behavioral:  Negative for agitation and dysphoric mood.        Objective:     BP 110/72   Pulse 64   Temp (!) 97.3 F (36.3 C) (Oral)   Ht 5' 3 (1.6 m)   Wt 185 lb 8 oz (84.1 kg)   SpO2 99%   BMI 32.86 kg/m  Wt Readings from Last 3 Encounters:  04/30/24 185 lb 8 oz (84.1 kg)  04/02/24 183 lb 12.8 oz (83.4 kg)  02/24/24 183 lb 9.6 oz (83.3 kg)    Physical Exam Vitals reviewed.  Constitutional:      General: She is not in acute distress.    Appearance: Normal appearance.  HENT:     Head: Normocephalic and  atraumatic.     Comments: No pain to palpation - temporal region.     Right Ear: External ear normal.     Left Ear: External ear normal.     Mouth/Throat:     Pharynx: No oropharyngeal exudate or posterior oropharyngeal erythema.  Eyes:     General: No scleral icterus.       Right eye: No discharge.        Left eye: No discharge.     Conjunctiva/sclera: Conjunctivae normal.  Neck:     Thyroid : No thyromegaly.  Cardiovascular:     Rate and Rhythm: Normal rate and regular rhythm.  Pulmonary:     Effort: No respiratory distress.     Breath sounds: Normal breath sounds. No wheezing.  Abdominal:     General: Bowel sounds are normal.     Palpations: Abdomen is soft.     Tenderness: There is no abdominal tenderness.  Musculoskeletal:        General: No swelling or tenderness.     Cervical back: Neck supple. No tenderness.  Lymphadenopathy:     Cervical: No cervical adenopathy.  Skin:    Findings: No erythema or rash.  Neurological:     Mental Status: She is alert.  Psychiatric:        Mood and Affect: Mood normal.        Behavior: Behavior normal.         Outpatient Encounter Medications as of 04/30/2024  Medication Sig  . albuterol  (VENTOLIN  HFA) 108 (90 Base) MCG/ACT inhaler INHALE 2 INHALATIONS BY MOUTH INTO THE LUNGS EVERY 6 HOURS AS NEEDED FOR WHEEZING  . ALPRAZolam  (XANAX ) 0.5 MG tablet TAKE 1/2 TABLET BY MOUTH ONCE DAILY AS NEEDED  . AMBULATORY NON FORMULARY MEDICATION Abdominal compression binder  . aspirin  EC 81 MG tablet Take 81 mg by mouth as needed.   SABRA  budesonide -formoterol  (SYMBICORT ) 160-4.5 MCG/ACT inhaler Inhale 2 puffs into the lungs 2 (two) times daily.  . Carbidopa -Levodopa  ER (SINEMET  CR) 25-100 MG tablet controlled release Take 2 at 7 AM, 2 at 11 AM, 1 at 4 PM and 2 tablets at bedtime .  . cholecalciferol (VITAMIN D3) 25 MCG (1000 UNIT) tablet Take 1,000 Units by mouth daily.  . COD LIVER OIL PO Take by mouth as needed.  . montelukast  (SINGULAIR ) 10 MG  tablet TAKE 1 TABLET BY MOUTH AT BEDTIME  . mupirocin  ointment (BACTROBAN ) 2 % Apply 1 Application topically 2 (two) times daily.  SABRA Spacer/Aero-Holding Chambers DEVI 1 Units by Does not apply route daily.  . vitamin B-12 (CYANOCOBALAMIN ) 100 MCG tablet Take 100 mcg by mouth daily.  . vitamin E 180 MG (400 UNITS) capsule Take 400 Units by mouth daily.  . citalopram  (CELEXA ) 10 MG tablet Take 1 tablet (10 mg total) by mouth daily. TAKE 1 TABLET BY MOUTH DAILY ALONG WITH CITALOPRAM  20 MG FOR A TOTAL OF 30 MG DAILY.  . citalopram  (CELEXA ) 20 MG tablet Take 1 tablet (20 mg total) by mouth daily. Take along with 10 mg daily - total of 30 mg daily  . traZODone  (DESYREL ) 50 MG tablet Take 2 tablets (100 mg total) by mouth at bedtime.  . [DISCONTINUED] citalopram  (CELEXA ) 10 MG tablet TAKE 1 TABLET BY MOUTH DAILY. TAKE ALONGWITH CITALOPRAM  20 MG FOR A TOTAL OF 30 MG DAILY.  . [DISCONTINUED] citalopram  (CELEXA ) 20 MG tablet TAKE 1 TABLET BY MOUTH DAILY  . [DISCONTINUED] citalopram  (CELEXA ) 20 MG tablet Take 1 tablet (20 mg total) by mouth daily. Take along with 10 mg daily - total of 30 mg daily ( no future refills) please contact primary care or establish care with new provider  . [DISCONTINUED] doxycycline  (VIBRA -TABS) 100 MG tablet Take 1 tablet (100 mg total) by mouth 2 (two) times daily.  . [DISCONTINUED] traZODone  (DESYREL ) 50 MG tablet TAKE 2 TABLETS BY MOUTH AT BEDTIME   No facility-administered encounter medications on file as of 04/30/2024.     Lab Results  Component Value Date   WBC 8.5 07/15/2023   HGB 12.3 07/15/2023   HCT 37.1 07/15/2023   PLT 248.0 07/15/2023   GLUCOSE 117 (H) 04/02/2024   CHOL 190 04/02/2024   TRIG 203.0 (H) 04/02/2024   HDL 53.40 04/02/2024   LDLDIRECT 163.4 12/31/2012   LDLCALC 96 04/02/2024   ALT 2 04/02/2024   AST 10 04/02/2024   NA 138 04/02/2024   K 4.1 04/02/2024   CL 102 04/02/2024   CREATININE 1.04 04/02/2024   BUN 21 04/02/2024   CO2 25 04/02/2024    TSH 1.80 04/16/2023   INR 1.0 12/12/2012   HGBA1C 5.9 04/02/2024    US  Venous Img Lower Unilateral Left Result Date: 02/24/2024 CLINICAL DATA:  left lower extremity swelling with redness. EXAM: LEFT LOWER EXTREMITY VENOUS DOPPLER ULTRASOUND TECHNIQUE: Gray-scale sonography with graded compression, as well as color Doppler and duplex ultrasound were performed to evaluate the lower extremity deep venous systems from the level of the common femoral vein and including the common femoral, femoral, profunda femoral, popliteal and calf veins including the posterior tibial, peroneal and gastrocnemius veins when visible. The superficial great saphenous vein was also interrogated. Spectral Doppler was utilized to evaluate flow at rest and with distal augmentation maneuvers in the common femoral, femoral and popliteal veins. COMPARISON:  None Available. FINDINGS: Contralateral Common Femoral Vein: Respiratory phasicity is normal and symmetric with the symptomatic  side. No evidence of thrombus. Normal compressibility. Common Femoral Vein: No evidence of thrombus. Normal compressibility, respiratory phasicity and response to augmentation. Saphenofemoral Junction: No evidence of thrombus. Normal compressibility and flow on color Doppler imaging. Profunda Femoral Vein: No evidence of thrombus. Normal compressibility and flow on color Doppler imaging. Femoral Vein: No evidence of thrombus. Normal compressibility, respiratory phasicity and response to augmentation. Popliteal Vein: No evidence of thrombus. Normal compressibility, respiratory phasicity and response to augmentation. Calf Veins: Limited assessment of the calf veins. No gross significant thrombus appreciated. IMPRESSION: No significant left lower extremity femoropopliteal DVT. Limited assessment of the calf veins. Electronically Signed   By: CHRISTELLA.  Shick M.D.   On: 02/24/2024 16:30       Assessment & Plan:  Pure hypercholesterolemia Assessment & Plan: Off  crestor . Did not tolerate. Low cholesterol diet and exercise. Follow lipid panel.    Hyperglycemia Assessment & Plan: Low carb diet and exercise. Follow met b and A1c.   Orders: -     Hemoglobin A1c; Future  Hypercholesterolemia -     Lipid panel; Future -     Hepatic function panel; Future  Primary hypertension Assessment & Plan: Blood pressure as outlined. On no medication. Follow pressures. Follow metabolic panel.   Orders: -     Basic metabolic panel with GFR; Future  Orthostatic hypotension due to Parkinson's disease [facility]) Assessment & Plan: Off blood pressure medication. Blood pressure as outlined. Varies. Overall stable. Follow.   Orders: -     CBC with Differential/Platelet; Future  GAD (generalized anxiety disorder) -     Citalopram  Hydrobromide; Take 1 tablet (10 mg total) by mouth daily. TAKE 1 TABLET BY MOUTH DAILY ALONG WITH CITALOPRAM  20 MG FOR A TOTAL OF 30 MG DAILY.  Dispense: 30 tablet; Refill: 1  Depression, unspecified depression type -     Citalopram  Hydrobromide; Take 1 tablet (10 mg total) by mouth daily. TAKE 1 TABLET BY MOUTH DAILY ALONG WITH CITALOPRAM  20 MG FOR A TOTAL OF 30 MG DAILY.  Dispense: 30 tablet; Refill: 1  Generalized anxiety disorder -     Citalopram  Hydrobromide; Take 1 tablet (20 mg total) by mouth daily. Take along with 10 mg daily - total of 30 mg daily  Dispense: 30 tablet; Refill: 1  Parkinson's disease, unspecified whether dyskinesia present, unspecified whether manifestations fluctuate (HCC) Assessment & Plan: Followed by Dr Evonnie. Continues on sinemet . Overall stable. Continue f/u with neurology.    Mixed hyperlipidemia Assessment & Plan: Off crestor . Intolerance. Follow lipid panel. Need to consider trial of another statin.    History of malignant neoplasm metastatic to lung Assessment & Plan: S/p lobectomy.  Followed by pulmonary.  Albuterol  inhaler if needed. Breathing stable.    History of colon cancer Assessment &  Plan:  Saw GI 12/20/22 - f/u colon cancer and anemia.  Has desired no f/u colonoscopy.    Hepatic steatosis Assessment & Plan: Diet and exercise.  Seen on chest CT angio - 09/11/21. Follow liver panel. Continue diet. Exercise as tolerated.    Fibromuscular dysplasia Assessment & Plan: Has been evaluated by [facility].  Continue risk factor modification. Continue daily aspirin .    Depression, major, single episode, mild Assessment & Plan: Continues on citalopram . Was seeing psychiatry. Given family sees her current psychiatrist, she is having to change psychiatrist. Doing well on current regimen. Has decreased xanax  usage. Follow.    Stage 3 chronic kidney disease, unspecified whether stage 3a or 3b CKD (HCC) Assessment & Plan: Continue  to avoid antiinflammatory medication. Off micardis  due to low pressures. Blood pressure as outlined. No additional medication at this time. Follow pressures. Follow metabolic panel.    Bilateral carotid artery disease, unspecified type Assessment & Plan: Saw [facility] 07/19/16 - carotid ultrasound - <30% bilateral. Reevaluated 04/04/23 - carotid- no significant disease. Recommended f/u every other year. Off cholesterol medication. Blood pressure as outlined. Continue daily aspirin . Need to arrange f/u - due.    Anemia, unspecified type Assessment & Plan: Follow [facility].    Vision changes Assessment & Plan: Head pain with associated vision change as outlined. No head pain or headache now. Unclear etiology. Continue daily aspirin . Discussed MRI ( brain). Discussed f/u with neurology.    Other orders -     traZODone  HCl; Take 2 tablets (100 mg total) by mouth at bedtime.  Dispense: 180 tablet; Refill: 0     Allena Hamilton, MD

## 2024-05-01 ENCOUNTER — Telehealth: Payer: Self-pay

## 2024-05-01 DIAGNOSIS — F419 Anxiety disorder, unspecified: Secondary | ICD-10-CM

## 2024-05-01 DIAGNOSIS — H539 Unspecified visual disturbance: Secondary | ICD-10-CM

## 2024-05-01 NOTE — Telephone Encounter (Signed)
 Pt states that she was making sure that you were going to refer her to a psych near Dr Tat office near [place].  She didn't have a name or location to provide

## 2024-05-01 NOTE — Telephone Encounter (Signed)
 Copied from CRM [id number]. Topic: Referral - Request for Referral >> May 01, 2024  2:49 PM Anairis L wrote: Did the patient discuss referral with their provider in the last year? Yes (If No - schedule appointment) (If Yes - send message)  Appointment offered? Yes  Type of order/referral and detailed reason for visit: psychology   Preference of office, provider, location:  [facility] If referral order, have you been seen by this specialty before? No (If Yes, this issue or another issue? When? Where?  Can we respond through MyChart? No

## 2024-05-02 NOTE — Telephone Encounter (Signed)
 In checking, it does not appear that there is a psychiatrist at the location where Dr Tat is located. Is she agreeable to see if I can schedule her with Dr Chipper in [facility] or psychiatry referral to another location in [place] - [facility]) is the location.

## 2024-05-06 NOTE — Telephone Encounter (Signed)
 Left voicemail to return call. Okay to relay message

## 2024-05-08 ENCOUNTER — Encounter: Payer: Self-pay | Admitting: Internal Medicine

## 2024-05-08 DIAGNOSIS — H539 Unspecified visual disturbance: Secondary | ICD-10-CM | POA: Insufficient documentation

## 2024-05-08 MED ORDER — CITALOPRAM HYDROBROMIDE 10 MG PO TABS: 10.0000 mg | ORAL_TABLET | Freq: Every day | ORAL | 1 refills | Status: DC

## 2024-05-08 MED ORDER — CITALOPRAM HYDROBROMIDE 20 MG PO TABS
20.0000 mg | ORAL_TABLET | Freq: Every day | ORAL | 1 refills | Status: AC
Start: 2024-05-08 — End: 2024-06-07

## 2024-05-08 MED ORDER — TRAZODONE HCL 50 MG PO TABS: 100.0000 mg | ORAL_TABLET | Freq: Every day | ORAL | 0 refills | Status: AC

## 2024-05-08 NOTE — Assessment & Plan Note (Signed)
 Diet and exercise.  Seen on chest CT angio - 09/11/21. Follow liver panel. Continue diet. Exercise as tolerated.

## 2024-05-08 NOTE — Assessment & Plan Note (Signed)
 Follow cbc.

## 2024-05-08 NOTE — Assessment & Plan Note (Signed)
 Saw [facility] 07/19/16 - carotid ultrasound - <30% bilateral. Reevaluated 04/04/23 - carotid- no significant disease. Recommended f/u every other year. Off cholesterol medication. Blood pressure as outlined. Continue daily aspirin . Need to arrange f/u - due.

## 2024-05-08 NOTE — Assessment & Plan Note (Signed)
 S/p lobectomy.  Followed by pulmonary.  Albuterol inhaler if needed. Breathing stable.

## 2024-05-08 NOTE — Assessment & Plan Note (Signed)
 Off crestor . Did not tolerate. Low cholesterol diet and exercise. Follow lipid panel.

## 2024-05-08 NOTE — Assessment & Plan Note (Signed)
 Continue to avoid antiinflammatory medication. Off micardis  due to low pressures. Blood pressure as outlined. No additional medication at this time. Follow pressures. Follow metabolic panel.

## 2024-05-08 NOTE — Assessment & Plan Note (Signed)
 Off crestor . Intolerance. Follow lipid panel. Need to consider trial of another statin.

## 2024-05-08 NOTE — Assessment & Plan Note (Signed)
 Low-carb diet and exercise.  Follow met b and A1c.

## 2024-05-08 NOTE — Assessment & Plan Note (Signed)
 Off blood pressure medication. Blood pressure as outlined. Varies. Overall stable. Follow.

## 2024-05-08 NOTE — Assessment & Plan Note (Signed)
 Continues on citalopram . Was seeing psychiatry. Given family sees her current psychiatrist, she is having to change psychiatrist. Doing well on current regimen. Has decreased xanax  usage. Follow.

## 2024-05-08 NOTE — Assessment & Plan Note (Signed)
 Saw GI 12/20/22 - f/u colon cancer and anemia.  Has desired no f/u colonoscopy.

## 2024-05-08 NOTE — Assessment & Plan Note (Signed)
Blood pressure as outlined.  On no medication.  Follow pressures.  Follow metabolic panel.  

## 2024-05-08 NOTE — Assessment & Plan Note (Signed)
 Has been evaluated by [facility].  Continue risk factor modification. Continue daily aspirin .

## 2024-05-08 NOTE — Assessment & Plan Note (Signed)
 Followed by Dr Evonnie. Continues on sinemet . Overall stable. Continue f/u with neurology.

## 2024-05-08 NOTE — Assessment & Plan Note (Addendum)
 Head pain with associated vision change as outlined. No head pain or headache now. Unclear etiology. Continue daily aspirin . Discussed MRI ( brain). Discussed f/u with neurology.

## 2024-05-12 ENCOUNTER — Encounter

## 2024-05-14 NOTE — Telephone Encounter (Unsigned)
 Copied from CRM #[id number]. Topic: Referral - Request for Referral >> May 14, 2024  1:33 PM Suzen RAMAN wrote: Did the patient discuss referral with their provider in the last year? Yes (If No - schedule appointment) (If Yes - send message)  Appointment offered? No  Type of order/referral and detailed reason for visit: MRI of brain; pain and swelling in temple  Preference of office, provider, location: within area  If referral order, have you been seen by this specialty before? unknown (If Yes, this issue or another issue? When? Where?  Can we respond through MyChart? No

## 2024-05-16 NOTE — Addendum Note (Signed)
 Addended by: GLENDIA ALLENA RAMAN on: 05/16/2024 03:28 PM   Modules accepted: Orders

## 2024-05-16 NOTE — Telephone Encounter (Signed)
 Late entry. Spoke to pt - 05/14/24 - discussed symptoms. No head pain now. No recurrence. Discussed f/u with behavioral health. Agreeable to go to [facility] if necessary. Order placed for new referral.

## 2024-05-18 NOTE — Addendum Note (Signed)
 Addended by: GLENDIA ALLENA RAMAN on: 05/18/2024 07:48 PM   Modules accepted: Orders

## 2024-05-26 ENCOUNTER — Ambulatory Visit: Admission: RE | Admit: 2024-05-26 | Discharge: 2024-05-26 | Attending: Internal Medicine | Admitting: Internal Medicine

## 2024-05-26 DIAGNOSIS — H539 Unspecified visual disturbance: Secondary | ICD-10-CM | POA: Insufficient documentation

## 2024-06-01 ENCOUNTER — Ambulatory Visit: Payer: Self-pay | Admitting: Internal Medicine

## 2024-06-15 ENCOUNTER — Ambulatory Visit: Admitting: Internal Medicine

## 2024-06-15 DIAGNOSIS — F32A Depression, unspecified: Secondary | ICD-10-CM

## 2024-06-15 DIAGNOSIS — F411 Generalized anxiety disorder: Secondary | ICD-10-CM

## 2024-06-23 ENCOUNTER — Other Ambulatory Visit: Payer: Self-pay | Admitting: Internal Medicine

## 2024-06-24 ENCOUNTER — Telehealth: Payer: Self-pay

## 2024-06-24 NOTE — Telephone Encounter (Signed)
 Copied from CRM [id number]. Topic: Appointments - Scheduling Inquiry for Clinic >> Jun 24, 2024 12:14 PM Nessti S wrote: Reason for CRM: pt called in because she would like to speak with pcp about appt that was made for 07/23/24. She would like a call back soon as possible

## 2024-06-25 NOTE — Telephone Encounter (Signed)
 She had an appt on 06/15/24 that was canceled. If needs appt earlier - I can work her in 07/03/24 - 12:30.

## 2024-06-25 NOTE — Telephone Encounter (Signed)
Rx sent in for albuterol.  

## 2024-07-07 ENCOUNTER — Other Ambulatory Visit: Payer: Self-pay | Admitting: Internal Medicine

## 2024-07-07 DIAGNOSIS — F32A Depression, unspecified: Secondary | ICD-10-CM

## 2024-07-07 DIAGNOSIS — F411 Generalized anxiety disorder: Secondary | ICD-10-CM

## 2024-07-08 NOTE — Telephone Encounter (Signed)
 Rx ok'd for citalopram .

## 2024-07-23 ENCOUNTER — Ambulatory Visit: Admitting: Internal Medicine

## 2024-08-21 ENCOUNTER — Ambulatory Visit: Admitting: Neurology

## 2025-01-11 ENCOUNTER — Ambulatory Visit
